# Patient Record
Sex: Female | Born: 1945
Health system: Southern US, Community
[De-identification: ages and names within clinical notes are randomized; demographics above are authoritative.]

## PROBLEM LIST (undated history)

## (undated) ENCOUNTER — Emergency Department (HOSPITAL_COMMUNITY): Admission: EM | Payer: Medicare Other | Source: Home / Self Care

## (undated) DIAGNOSIS — M4310 Spondylolisthesis, site unspecified: Secondary | ICD-10-CM

## (undated) DIAGNOSIS — F32A Depression, unspecified: Secondary | ICD-10-CM

## (undated) DIAGNOSIS — J189 Pneumonia, unspecified organism: Secondary | ICD-10-CM

## (undated) DIAGNOSIS — H532 Diplopia: Secondary | ICD-10-CM

## (undated) DIAGNOSIS — C4490 Unspecified malignant neoplasm of skin, unspecified: Secondary | ICD-10-CM

## (undated) DIAGNOSIS — I639 Cerebral infarction, unspecified: Secondary | ICD-10-CM

## (undated) DIAGNOSIS — R0602 Shortness of breath: Secondary | ICD-10-CM

## (undated) DIAGNOSIS — I272 Pulmonary hypertension, unspecified: Secondary | ICD-10-CM

## (undated) DIAGNOSIS — Z950 Presence of cardiac pacemaker: Secondary | ICD-10-CM

## (undated) DIAGNOSIS — Z7901 Long term (current) use of anticoagulants: Secondary | ICD-10-CM

## (undated) DIAGNOSIS — I2119 ST elevation (STEMI) myocardial infarction involving other coronary artery of inferior wall: Secondary | ICD-10-CM

## (undated) DIAGNOSIS — M1990 Unspecified osteoarthritis, unspecified site: Secondary | ICD-10-CM

## (undated) DIAGNOSIS — E669 Obesity, unspecified: Secondary | ICD-10-CM

## (undated) DIAGNOSIS — K649 Unspecified hemorrhoids: Secondary | ICD-10-CM

## (undated) DIAGNOSIS — F419 Anxiety disorder, unspecified: Secondary | ICD-10-CM

## (undated) DIAGNOSIS — N189 Chronic kidney disease, unspecified: Secondary | ICD-10-CM

## (undated) DIAGNOSIS — G4733 Obstructive sleep apnea (adult) (pediatric): Secondary | ICD-10-CM

## (undated) DIAGNOSIS — I10 Essential (primary) hypertension: Secondary | ICD-10-CM

## (undated) DIAGNOSIS — G4730 Sleep apnea, unspecified: Secondary | ICD-10-CM

## (undated) DIAGNOSIS — Z9989 Dependence on other enabling machines and devices: Secondary | ICD-10-CM

## (undated) DIAGNOSIS — G459 Transient cerebral ischemic attack, unspecified: Secondary | ICD-10-CM

## (undated) DIAGNOSIS — I482 Chronic atrial fibrillation, unspecified: Secondary | ICD-10-CM

## (undated) DIAGNOSIS — I219 Acute myocardial infarction, unspecified: Secondary | ICD-10-CM

## (undated) DIAGNOSIS — F329 Major depressive disorder, single episode, unspecified: Secondary | ICD-10-CM

## (undated) DIAGNOSIS — R600 Localized edema: Secondary | ICD-10-CM

## (undated) DIAGNOSIS — E785 Hyperlipidemia, unspecified: Secondary | ICD-10-CM

## (undated) DIAGNOSIS — I499 Cardiac arrhythmia, unspecified: Secondary | ICD-10-CM

## (undated) DIAGNOSIS — I509 Heart failure, unspecified: Secondary | ICD-10-CM

## (undated) DIAGNOSIS — I2510 Atherosclerotic heart disease of native coronary artery without angina pectoris: Secondary | ICD-10-CM

## (undated) HISTORY — PX: JOINT REPLACEMENT: SHX530

## (undated) HISTORY — DX: Obesity, unspecified: E66.9

## (undated) HISTORY — DX: Chronic atrial fibrillation, unspecified: I48.20

## (undated) HISTORY — DX: Diplopia: H53.2

## (undated) HISTORY — DX: Unspecified hemorrhoids: K64.9

## (undated) HISTORY — DX: Sleep apnea, unspecified: G47.30

## (undated) HISTORY — DX: Depression, unspecified: F32.A

## (undated) HISTORY — DX: Atherosclerotic heart disease of native coronary artery without angina pectoris: I25.10

## (undated) HISTORY — PX: CATARACT EXTRACTION W/ INTRAOCULAR LENS  IMPLANT, BILATERAL: SHX1307

## (undated) HISTORY — PX: SKIN CANCER EXCISION: SHX779

## (undated) HISTORY — DX: Dependence on other enabling machines and devices: Z99.89

## (undated) HISTORY — DX: Localized edema: R60.0

## (undated) HISTORY — PX: LEFT OOPHORECTOMY: SHX1961

## (undated) HISTORY — PX: BACK SURGERY: SHX140

## (undated) HISTORY — DX: Long term (current) use of anticoagulants: Z79.01

## (undated) HISTORY — DX: Anxiety disorder, unspecified: F41.9

## (undated) HISTORY — PX: POSTERIOR LUMBAR FUSION: SHX6036

## (undated) HISTORY — PX: CHOLECYSTECTOMY: SHX55

## (undated) HISTORY — DX: Obstructive sleep apnea (adult) (pediatric): G47.33

## (undated) HISTORY — DX: Hyperlipidemia, unspecified: E78.5

## (undated) HISTORY — DX: Essential (primary) hypertension: I10

## (undated) HISTORY — PX: CORONARY ANGIOPLASTY: SHX604

## (undated) HISTORY — PX: CORONARY ANGIOPLASTY WITH STENT PLACEMENT: SHX49

## (undated) HISTORY — DX: Major depressive disorder, single episode, unspecified: F32.9

## (undated) HISTORY — PX: ABDOMINAL HYSTERECTOMY: SHX81

## (undated) HISTORY — DX: Presence of cardiac pacemaker: Z95.0

---

## 1979-07-04 DIAGNOSIS — G4730 Sleep apnea, unspecified: Secondary | ICD-10-CM

## 1979-07-04 HISTORY — DX: Sleep apnea, unspecified: G47.30

## 1982-07-03 HISTORY — PX: TOTAL ABDOMINAL HYSTERECTOMY: SHX209

## 1982-07-03 HISTORY — PX: APPENDECTOMY: SHX54

## 1992-07-03 HISTORY — PX: LAPAROSCOPIC CHOLECYSTECTOMY: SUR755

## 1997-10-01 ENCOUNTER — Encounter: Admission: RE | Admit: 1997-10-01 | Discharge: 1997-12-30 | Payer: Self-pay | Admitting: Family Medicine

## 1997-11-19 ENCOUNTER — Other Ambulatory Visit: Admission: RE | Admit: 1997-11-19 | Discharge: 1997-11-19 | Payer: Self-pay | Admitting: Cardiovascular Disease

## 1999-04-27 ENCOUNTER — Encounter: Payer: Self-pay | Admitting: Neurosurgery

## 1999-04-29 ENCOUNTER — Inpatient Hospital Stay (HOSPITAL_COMMUNITY): Admission: RE | Admit: 1999-04-29 | Discharge: 1999-05-01 | Payer: Self-pay | Admitting: Neurosurgery

## 1999-04-29 ENCOUNTER — Encounter: Payer: Self-pay | Admitting: Neurosurgery

## 1999-05-30 ENCOUNTER — Encounter: Payer: Self-pay | Admitting: Neurosurgery

## 1999-05-30 ENCOUNTER — Ambulatory Visit (HOSPITAL_COMMUNITY): Admission: RE | Admit: 1999-05-30 | Discharge: 1999-05-30 | Payer: Self-pay | Admitting: Neurosurgery

## 1999-08-08 ENCOUNTER — Ambulatory Visit (HOSPITAL_COMMUNITY): Admission: RE | Admit: 1999-08-08 | Discharge: 1999-08-08 | Payer: Self-pay | Admitting: Neurosurgery

## 1999-08-08 ENCOUNTER — Encounter: Payer: Self-pay | Admitting: Neurosurgery

## 2000-05-07 ENCOUNTER — Encounter: Payer: Self-pay | Admitting: Family Medicine

## 2000-05-07 ENCOUNTER — Encounter: Admission: RE | Admit: 2000-05-07 | Discharge: 2000-05-07 | Payer: Self-pay | Admitting: Family Medicine

## 2002-04-18 ENCOUNTER — Ambulatory Visit (HOSPITAL_COMMUNITY): Admission: RE | Admit: 2002-04-18 | Discharge: 2002-04-18 | Payer: Self-pay | Admitting: Family Medicine

## 2002-04-18 ENCOUNTER — Encounter: Payer: Self-pay | Admitting: Family Medicine

## 2003-06-12 ENCOUNTER — Ambulatory Visit (HOSPITAL_COMMUNITY): Admission: RE | Admit: 2003-06-12 | Discharge: 2003-06-12 | Payer: Self-pay | Admitting: Cardiovascular Disease

## 2003-07-15 DIAGNOSIS — K649 Unspecified hemorrhoids: Secondary | ICD-10-CM

## 2003-07-15 HISTORY — DX: Unspecified hemorrhoids: K64.9

## 2003-11-06 ENCOUNTER — Ambulatory Visit (HOSPITAL_COMMUNITY): Admission: RE | Admit: 2003-11-06 | Discharge: 2003-11-06 | Payer: Self-pay | Admitting: Neurosurgery

## 2003-11-12 ENCOUNTER — Encounter: Admission: RE | Admit: 2003-11-12 | Discharge: 2003-11-12 | Payer: Self-pay | Admitting: Neurosurgery

## 2003-11-16 ENCOUNTER — Inpatient Hospital Stay (HOSPITAL_COMMUNITY): Admission: RE | Admit: 2003-11-16 | Discharge: 2003-11-18 | Payer: Self-pay | Admitting: Neurosurgery

## 2003-12-17 ENCOUNTER — Encounter: Admission: RE | Admit: 2003-12-17 | Discharge: 2003-12-17 | Payer: Self-pay | Admitting: Neurosurgery

## 2004-02-24 ENCOUNTER — Encounter: Admission: RE | Admit: 2004-02-24 | Discharge: 2004-02-24 | Payer: Self-pay | Admitting: Neurosurgery

## 2005-03-01 ENCOUNTER — Ambulatory Visit: Payer: Self-pay | Admitting: Internal Medicine

## 2005-03-07 ENCOUNTER — Ambulatory Visit: Payer: Self-pay

## 2005-04-14 ENCOUNTER — Ambulatory Visit: Payer: Self-pay | Admitting: Internal Medicine

## 2005-07-03 LAB — HM COLONOSCOPY

## 2005-12-02 ENCOUNTER — Inpatient Hospital Stay (HOSPITAL_COMMUNITY): Admission: EM | Admit: 2005-12-02 | Discharge: 2005-12-04 | Payer: Self-pay | Admitting: Emergency Medicine

## 2006-05-05 ENCOUNTER — Inpatient Hospital Stay (HOSPITAL_COMMUNITY): Admission: EM | Admit: 2006-05-05 | Discharge: 2006-05-11 | Payer: Self-pay | Admitting: Emergency Medicine

## 2006-07-03 DIAGNOSIS — G459 Transient cerebral ischemic attack, unspecified: Secondary | ICD-10-CM

## 2006-07-03 HISTORY — DX: Transient cerebral ischemic attack, unspecified: G45.9

## 2006-10-19 ENCOUNTER — Ambulatory Visit: Payer: Self-pay | Admitting: Internal Medicine

## 2007-03-20 ENCOUNTER — Ambulatory Visit: Payer: Self-pay | Admitting: Internal Medicine

## 2007-03-25 ENCOUNTER — Ambulatory Visit: Payer: Self-pay | Admitting: Internal Medicine

## 2007-03-25 ENCOUNTER — Inpatient Hospital Stay (HOSPITAL_COMMUNITY): Admission: AD | Admit: 2007-03-25 | Discharge: 2007-03-29 | Payer: Self-pay | Admitting: Internal Medicine

## 2007-04-03 ENCOUNTER — Ambulatory Visit: Payer: Self-pay | Admitting: Cardiology

## 2007-04-03 LAB — CONVERTED CEMR LAB
BUN: 14 mg/dL (ref 6–23)
CO2: 34 meq/L — ABNORMAL HIGH (ref 19–32)
Calcium: 9.2 mg/dL (ref 8.4–10.5)
Chloride: 99 meq/L (ref 96–112)
Creatinine, Ser: 0.9 mg/dL (ref 0.4–1.2)
GFR calc Af Amer: 82 mL/min
GFR calc non Af Amer: 68 mL/min
Glucose, Bld: 89 mg/dL (ref 70–99)
Potassium: 3.7 meq/L (ref 3.5–5.1)
Sodium: 138 meq/L (ref 135–145)

## 2007-04-16 ENCOUNTER — Ambulatory Visit: Payer: Self-pay | Admitting: Cardiovascular Disease

## 2007-04-25 ENCOUNTER — Ambulatory Visit: Payer: Self-pay | Admitting: Cardiovascular Disease

## 2007-05-09 ENCOUNTER — Ambulatory Visit: Payer: Self-pay

## 2007-05-09 ENCOUNTER — Ambulatory Visit: Payer: Self-pay | Admitting: Cardiology

## 2007-05-09 ENCOUNTER — Ambulatory Visit: Payer: Self-pay | Admitting: Internal Medicine

## 2007-05-28 ENCOUNTER — Ambulatory Visit: Payer: Self-pay | Admitting: Internal Medicine

## 2007-06-03 DIAGNOSIS — I482 Chronic atrial fibrillation, unspecified: Secondary | ICD-10-CM

## 2007-06-03 HISTORY — PX: INSERT / REPLACE / REMOVE PACEMAKER: SUR710

## 2007-06-03 HISTORY — DX: Chronic atrial fibrillation, unspecified: I48.20

## 2007-06-11 ENCOUNTER — Ambulatory Visit: Payer: Self-pay | Admitting: Internal Medicine

## 2007-06-11 LAB — CONVERTED CEMR LAB
BUN: 13 mg/dL (ref 6–23)
Basophils Absolute: 0.1 10*3/uL (ref 0.0–0.1)
Basophils Relative: 0.7 % (ref 0.0–1.0)
CO2: 32 meq/L (ref 19–32)
Calcium: 9.3 mg/dL (ref 8.4–10.5)
Chloride: 95 meq/L — ABNORMAL LOW (ref 96–112)
Creatinine, Ser: 0.9 mg/dL (ref 0.4–1.2)
Eosinophils Absolute: 0.2 10*3/uL (ref 0.0–0.6)
Eosinophils Relative: 2.4 % (ref 0.0–5.0)
GFR calc Af Amer: 82 mL/min
GFR calc non Af Amer: 68 mL/min
Glucose, Bld: 93 mg/dL (ref 70–99)
HCT: 39.2 % (ref 36.0–46.0)
Hemoglobin: 13.5 g/dL (ref 12.0–15.0)
INR: 2 — ABNORMAL HIGH (ref 0.8–1.0)
Lymphocytes Relative: 35.1 % (ref 12.0–46.0)
MCHC: 34.5 g/dL (ref 30.0–36.0)
MCV: 93.7 fL (ref 78.0–100.0)
Monocytes Absolute: 0.9 10*3/uL — ABNORMAL HIGH (ref 0.2–0.7)
Monocytes Relative: 9.3 % (ref 3.0–11.0)
Neutro Abs: 5 10*3/uL (ref 1.4–7.7)
Neutrophils Relative %: 52.5 % (ref 43.0–77.0)
Platelets: 291 10*3/uL (ref 150–400)
Potassium: 3.9 meq/L (ref 3.5–5.1)
Prothrombin Time: 17.8 s — ABNORMAL HIGH (ref 10.9–13.3)
RBC: 4.18 M/uL (ref 3.87–5.11)
RDW: 12.8 % (ref 11.5–14.6)
Sodium: 135 meq/L (ref 135–145)
WBC: 9.5 10*3/uL (ref 4.5–10.5)
aPTT: 32.7 s — ABNORMAL HIGH (ref 21.7–29.8)

## 2007-06-12 ENCOUNTER — Ambulatory Visit: Payer: Self-pay | Admitting: Cardiology

## 2007-06-13 ENCOUNTER — Ambulatory Visit: Payer: Self-pay | Admitting: Internal Medicine

## 2007-06-13 ENCOUNTER — Ambulatory Visit (HOSPITAL_COMMUNITY): Admission: RE | Admit: 2007-06-13 | Discharge: 2007-06-14 | Payer: Self-pay | Admitting: Internal Medicine

## 2007-06-20 ENCOUNTER — Ambulatory Visit: Payer: Self-pay | Admitting: Cardiology

## 2007-06-24 ENCOUNTER — Ambulatory Visit: Payer: Self-pay | Admitting: Cardiology

## 2007-07-01 ENCOUNTER — Ambulatory Visit: Payer: Self-pay | Admitting: Cardiology

## 2007-07-01 ENCOUNTER — Ambulatory Visit: Payer: Self-pay

## 2007-07-04 HISTORY — PX: DOPPLER ECHOCARDIOGRAPHY: SHX263

## 2007-07-09 ENCOUNTER — Ambulatory Visit: Payer: Self-pay | Admitting: Cardiology

## 2007-07-09 ENCOUNTER — Ambulatory Visit: Payer: Self-pay | Admitting: Internal Medicine

## 2007-07-19 ENCOUNTER — Ambulatory Visit: Payer: Self-pay | Admitting: Cardiology

## 2007-08-02 ENCOUNTER — Ambulatory Visit: Payer: Self-pay | Admitting: Cardiology

## 2007-08-28 ENCOUNTER — Ambulatory Visit: Payer: Self-pay | Admitting: Cardiology

## 2007-09-06 ENCOUNTER — Ambulatory Visit: Payer: Self-pay | Admitting: Cardiology

## 2007-09-16 ENCOUNTER — Ambulatory Visit: Payer: Self-pay | Admitting: Internal Medicine

## 2007-10-14 ENCOUNTER — Ambulatory Visit: Payer: Self-pay | Admitting: Cardiology

## 2007-11-11 ENCOUNTER — Ambulatory Visit: Payer: Self-pay | Admitting: Cardiology

## 2007-12-13 ENCOUNTER — Ambulatory Visit: Payer: Self-pay | Admitting: Cardiology

## 2008-01-06 ENCOUNTER — Ambulatory Visit: Payer: Self-pay | Admitting: Internal Medicine

## 2008-01-06 ENCOUNTER — Ambulatory Visit: Payer: Self-pay | Admitting: Cardiology

## 2008-02-10 ENCOUNTER — Ambulatory Visit: Payer: Self-pay | Admitting: Cardiology

## 2008-04-08 ENCOUNTER — Ambulatory Visit: Payer: Self-pay | Admitting: Cardiology

## 2008-05-15 ENCOUNTER — Ambulatory Visit: Payer: Self-pay | Admitting: Internal Medicine

## 2008-05-15 ENCOUNTER — Ambulatory Visit: Payer: Self-pay | Admitting: Cardiovascular Disease

## 2008-06-05 ENCOUNTER — Ambulatory Visit: Payer: Self-pay | Admitting: Internal Medicine

## 2008-06-08 ENCOUNTER — Inpatient Hospital Stay (HOSPITAL_COMMUNITY): Admission: EM | Admit: 2008-06-08 | Discharge: 2008-06-10 | Payer: Self-pay | Admitting: Emergency Medicine

## 2008-06-08 ENCOUNTER — Ambulatory Visit: Payer: Self-pay | Admitting: Internal Medicine

## 2008-06-09 ENCOUNTER — Encounter (INDEPENDENT_AMBULATORY_CARE_PROVIDER_SITE_OTHER): Payer: Self-pay | Admitting: Internal Medicine

## 2008-06-10 ENCOUNTER — Encounter: Payer: Self-pay | Admitting: Internal Medicine

## 2008-06-15 ENCOUNTER — Encounter: Payer: Self-pay | Admitting: Internal Medicine

## 2008-06-19 ENCOUNTER — Ambulatory Visit: Payer: Self-pay | Admitting: Internal Medicine

## 2008-06-19 DIAGNOSIS — R7309 Other abnormal glucose: Secondary | ICD-10-CM | POA: Insufficient documentation

## 2008-06-19 DIAGNOSIS — Z8679 Personal history of other diseases of the circulatory system: Secondary | ICD-10-CM | POA: Insufficient documentation

## 2008-06-19 DIAGNOSIS — I495 Sick sinus syndrome: Secondary | ICD-10-CM | POA: Insufficient documentation

## 2008-06-19 DIAGNOSIS — Z9861 Coronary angioplasty status: Secondary | ICD-10-CM

## 2008-06-19 DIAGNOSIS — Z950 Presence of cardiac pacemaker: Secondary | ICD-10-CM | POA: Insufficient documentation

## 2008-06-19 DIAGNOSIS — H532 Diplopia: Secondary | ICD-10-CM

## 2008-06-19 DIAGNOSIS — I10 Essential (primary) hypertension: Secondary | ICD-10-CM | POA: Insufficient documentation

## 2008-06-19 DIAGNOSIS — I480 Paroxysmal atrial fibrillation: Secondary | ICD-10-CM | POA: Insufficient documentation

## 2008-06-19 DIAGNOSIS — I671 Cerebral aneurysm, nonruptured: Secondary | ICD-10-CM | POA: Insufficient documentation

## 2008-06-19 DIAGNOSIS — Z8659 Personal history of other mental and behavioral disorders: Secondary | ICD-10-CM | POA: Insufficient documentation

## 2008-06-19 DIAGNOSIS — I2510 Atherosclerotic heart disease of native coronary artery without angina pectoris: Secondary | ICD-10-CM | POA: Insufficient documentation

## 2008-06-19 DIAGNOSIS — J309 Allergic rhinitis, unspecified: Secondary | ICD-10-CM | POA: Insufficient documentation

## 2008-06-19 HISTORY — DX: Diplopia: H53.2

## 2008-06-22 ENCOUNTER — Ambulatory Visit: Payer: Self-pay | Admitting: Internal Medicine

## 2008-06-22 ENCOUNTER — Ambulatory Visit: Payer: Self-pay | Admitting: Cardiology

## 2008-06-22 LAB — CONVERTED CEMR LAB
Homocysteine: 10.2 micromoles/L (ref 4.0–15.4)
Vit D, 1,25-Dihydroxy: 43 (ref 30–89)

## 2008-06-24 ENCOUNTER — Encounter: Payer: Self-pay | Admitting: Internal Medicine

## 2008-06-24 LAB — CONVERTED CEMR LAB
BUN: 22 mg/dL (ref 6–23)
CO2: 31 meq/L (ref 19–32)
CRP, High Sensitivity: 10 — ABNORMAL HIGH (ref 0.00–5.00)
Calcium: 9.6 mg/dL (ref 8.4–10.5)
Chloride: 100 meq/L (ref 96–112)
Cholesterol: 142 mg/dL (ref 0–200)
Creatinine, Ser: 0.9 mg/dL (ref 0.4–1.2)
GFR calc Af Amer: 82 mL/min
GFR calc non Af Amer: 67 mL/min
Glucose, Bld: 118 mg/dL — ABNORMAL HIGH (ref 70–99)
HDL: 59.5 mg/dL (ref >39.0)
LDL Cholesterol: 62 mg/dL (ref 0–99)
Potassium: 4 meq/L (ref 3.5–5.1)
Sodium: 138 meq/L (ref 135–145)
Total CHOL/HDL Ratio: 2.4
Triglycerides: 105 mg/dL (ref 0–149)
VLDL: 21 mg/dL (ref 0–40)

## 2008-07-02 ENCOUNTER — Ambulatory Visit: Payer: Self-pay | Admitting: Internal Medicine

## 2008-07-07 ENCOUNTER — Ambulatory Visit: Payer: Self-pay | Admitting: Internal Medicine

## 2008-07-28 ENCOUNTER — Ambulatory Visit: Payer: Self-pay | Admitting: Cardiology

## 2008-08-03 ENCOUNTER — Encounter: Payer: Self-pay | Admitting: Internal Medicine

## 2008-08-14 ENCOUNTER — Encounter: Payer: Self-pay | Admitting: Internal Medicine

## 2008-08-25 ENCOUNTER — Ambulatory Visit: Payer: Self-pay | Admitting: Cardiovascular Disease

## 2008-09-08 ENCOUNTER — Ambulatory Visit: Payer: Self-pay | Admitting: Cardiology

## 2008-09-15 ENCOUNTER — Ambulatory Visit: Payer: Self-pay | Admitting: Internal Medicine

## 2008-09-15 DIAGNOSIS — R0989 Other specified symptoms and signs involving the circulatory and respiratory systems: Secondary | ICD-10-CM | POA: Insufficient documentation

## 2008-09-15 DIAGNOSIS — R0609 Other forms of dyspnea: Secondary | ICD-10-CM | POA: Insufficient documentation

## 2008-09-15 DIAGNOSIS — E785 Hyperlipidemia, unspecified: Secondary | ICD-10-CM | POA: Insufficient documentation

## 2008-09-15 LAB — CONVERTED CEMR LAB: Blood Glucose, Fingerstick: 104

## 2008-09-29 ENCOUNTER — Ambulatory Visit: Payer: Self-pay | Admitting: Cardiology

## 2008-10-12 ENCOUNTER — Ambulatory Visit: Payer: Self-pay | Admitting: Pulmonary Disease

## 2008-10-12 DIAGNOSIS — G4733 Obstructive sleep apnea (adult) (pediatric): Secondary | ICD-10-CM | POA: Insufficient documentation

## 2008-10-16 ENCOUNTER — Encounter (INDEPENDENT_AMBULATORY_CARE_PROVIDER_SITE_OTHER): Payer: Self-pay

## 2008-10-20 ENCOUNTER — Encounter: Payer: Self-pay | Admitting: Internal Medicine

## 2008-10-20 ENCOUNTER — Ambulatory Visit: Payer: Self-pay | Admitting: Cardiology

## 2008-10-20 ENCOUNTER — Ambulatory Visit (HOSPITAL_BASED_OUTPATIENT_CLINIC_OR_DEPARTMENT_OTHER): Admission: RE | Admit: 2008-10-20 | Discharge: 2008-10-20 | Payer: Self-pay | Admitting: Pulmonary Disease

## 2008-10-20 ENCOUNTER — Encounter: Payer: Self-pay | Admitting: Pulmonary Disease

## 2008-10-24 ENCOUNTER — Ambulatory Visit: Payer: Self-pay | Admitting: Pulmonary Disease

## 2008-10-27 ENCOUNTER — Telehealth (INDEPENDENT_AMBULATORY_CARE_PROVIDER_SITE_OTHER): Payer: Self-pay | Admitting: *Deleted

## 2008-10-29 ENCOUNTER — Ambulatory Visit: Payer: Self-pay | Admitting: Pulmonary Disease

## 2008-11-02 ENCOUNTER — Ambulatory Visit: Payer: Self-pay | Admitting: Internal Medicine

## 2008-11-17 ENCOUNTER — Encounter: Payer: Self-pay | Admitting: Internal Medicine

## 2008-11-24 ENCOUNTER — Encounter (INDEPENDENT_AMBULATORY_CARE_PROVIDER_SITE_OTHER): Payer: Self-pay | Admitting: *Deleted

## 2008-12-01 ENCOUNTER — Encounter: Payer: Self-pay | Admitting: *Deleted

## 2008-12-01 ENCOUNTER — Ambulatory Visit: Payer: Self-pay | Admitting: Pulmonary Disease

## 2009-01-06 ENCOUNTER — Encounter: Payer: Self-pay | Admitting: *Deleted

## 2009-01-22 ENCOUNTER — Ambulatory Visit: Payer: Self-pay | Admitting: Internal Medicine

## 2009-02-07 ENCOUNTER — Encounter: Payer: Self-pay | Admitting: Internal Medicine

## 2009-02-09 ENCOUNTER — Encounter: Payer: Self-pay | Admitting: Cardiology

## 2009-02-17 ENCOUNTER — Encounter (INDEPENDENT_AMBULATORY_CARE_PROVIDER_SITE_OTHER): Payer: Self-pay | Admitting: *Deleted

## 2009-02-18 ENCOUNTER — Encounter: Payer: Self-pay | Admitting: Internal Medicine

## 2009-03-10 ENCOUNTER — Encounter: Payer: Self-pay | Admitting: Cardiology

## 2009-04-06 ENCOUNTER — Encounter: Payer: Self-pay | Admitting: Pulmonary Disease

## 2009-04-12 ENCOUNTER — Encounter: Payer: Self-pay | Admitting: Internal Medicine

## 2009-04-12 LAB — HM MAMMOGRAPHY: HM Mammogram: NORMAL

## 2009-05-31 ENCOUNTER — Ambulatory Visit: Payer: Self-pay | Admitting: Pulmonary Disease

## 2009-05-31 ENCOUNTER — Ambulatory Visit: Payer: Self-pay | Admitting: Internal Medicine

## 2009-05-31 LAB — CONVERTED CEMR LAB
ALT: 33 units/L (ref 0–35)
AST: 22 units/L (ref 0–37)
Albumin: 3.9 g/dL (ref 3.5–5.2)
Alkaline Phosphatase: 79 units/L (ref 39–117)
BUN: 16 mg/dL (ref 6–23)
Basophils Absolute: 0.1 10*3/uL (ref 0.0–0.1)
Basophils Relative: 1.4 % (ref 0.0–3.0)
Bilirubin Urine: NEGATIVE
Bilirubin, Direct: 0 mg/dL (ref 0.0–0.3)
CO2: 29 meq/L (ref 19–32)
Calcium: 9.1 mg/dL (ref 8.4–10.5)
Chloride: 99 meq/L (ref 96–112)
Cholesterol: 138 mg/dL (ref 0–200)
Creatinine, Ser: 0.8 mg/dL (ref 0.4–1.2)
Eosinophils Absolute: 0.2 10*3/uL (ref 0.0–0.7)
Eosinophils Relative: 2.5 % (ref 0.0–5.0)
GFR calc non Af Amer: 76.92 mL/min (ref >60)
Glucose, Bld: 106 mg/dL — ABNORMAL HIGH (ref 70–99)
Glucose, Urine, Semiquant: NEGATIVE
HCT: 41.6 % (ref 36.0–46.0)
HDL: 51.9 mg/dL (ref >39.00)
Hemoglobin: 13.9 g/dL (ref 12.0–15.0)
Hgb A1c MFr Bld: 6.4 % (ref 4.6–6.5)
Ketones, urine, test strip: NEGATIVE
LDL Cholesterol: 60 mg/dL (ref 0–99)
Lymphocytes Relative: 29.9 % (ref 12.0–46.0)
Lymphs Abs: 2.1 10*3/uL (ref 0.7–4.0)
MCHC: 33.3 g/dL (ref 30.0–36.0)
MCV: 95.7 fL (ref 78.0–100.0)
Monocytes Absolute: 0.6 10*3/uL (ref 0.1–1.0)
Monocytes Relative: 7.8 % (ref 3.0–12.0)
Neutro Abs: 4.1 10*3/uL (ref 1.4–7.7)
Neutrophils Relative %: 58.4 % (ref 43.0–77.0)
Nitrite: NEGATIVE
Platelets: 263 10*3/uL (ref 150.0–400.0)
Potassium: 3.9 meq/L (ref 3.5–5.1)
Protein, U semiquant: NEGATIVE
RBC: 4.34 M/uL (ref 3.87–5.11)
RDW: 12.9 % (ref 11.5–14.6)
Sodium: 137 meq/L (ref 135–145)
Specific Gravity, Urine: 1.015
TSH: 2.19 microintl units/mL (ref 0.35–5.50)
Total Bilirubin: 0.9 mg/dL (ref 0.3–1.2)
Total CHOL/HDL Ratio: 3
Total Protein: 7.5 g/dL (ref 6.0–8.3)
Triglycerides: 130 mg/dL (ref 0.0–149.0)
Urobilinogen, UA: 0.2
VLDL: 26 mg/dL (ref 0.0–40.0)
WBC Urine, dipstick: NEGATIVE
WBC: 7.1 10*3/uL (ref 4.5–10.5)
pH: 7.5

## 2009-06-02 ENCOUNTER — Encounter: Payer: Self-pay | Admitting: Internal Medicine

## 2009-06-02 ENCOUNTER — Ambulatory Visit: Payer: Self-pay | Admitting: Internal Medicine

## 2009-06-02 DIAGNOSIS — E669 Obesity, unspecified: Secondary | ICD-10-CM | POA: Insufficient documentation

## 2009-06-02 DIAGNOSIS — E66812 Obesity, class 2: Secondary | ICD-10-CM | POA: Insufficient documentation

## 2009-06-02 LAB — CONVERTED CEMR LAB: Blood Glucose, Fingerstick: 114

## 2009-07-03 LAB — HM DIABETES EYE EXAM

## 2009-07-12 ENCOUNTER — Ambulatory Visit: Payer: Self-pay

## 2009-07-12 ENCOUNTER — Encounter: Payer: Self-pay | Admitting: Internal Medicine

## 2009-10-04 ENCOUNTER — Encounter: Payer: Self-pay | Admitting: Pulmonary Disease

## 2009-10-11 ENCOUNTER — Ambulatory Visit: Payer: Self-pay | Admitting: Internal Medicine

## 2009-11-30 ENCOUNTER — Telehealth: Payer: Self-pay | Admitting: Internal Medicine

## 2009-12-30 ENCOUNTER — Ambulatory Visit: Payer: Self-pay | Admitting: Internal Medicine

## 2009-12-30 DIAGNOSIS — L538 Other specified erythematous conditions: Secondary | ICD-10-CM | POA: Insufficient documentation

## 2009-12-30 DIAGNOSIS — B009 Herpesviral infection, unspecified: Secondary | ICD-10-CM | POA: Insufficient documentation

## 2010-01-10 ENCOUNTER — Ambulatory Visit: Payer: Self-pay | Admitting: Internal Medicine

## 2010-02-18 ENCOUNTER — Ambulatory Visit: Payer: Self-pay | Admitting: Cardiovascular Disease

## 2010-02-24 ENCOUNTER — Ambulatory Visit: Payer: Self-pay | Admitting: Internal Medicine

## 2010-02-24 LAB — CONVERTED CEMR LAB
ALT: 28 units/L (ref 0–35)
AST: 26 units/L (ref 0–37)
Albumin: 4 g/dL (ref 3.5–5.2)
Alkaline Phosphatase: 76 units/L (ref 39–117)
BUN: 21 mg/dL (ref 6–23)
Bilirubin, Direct: 0.1 mg/dL (ref 0.0–0.3)
CO2: 29 meq/L (ref 19–32)
Calcium: 9.4 mg/dL (ref 8.4–10.5)
Chloride: 99 meq/L (ref 96–112)
Cholesterol: 144 mg/dL (ref 0–200)
Creatinine, Ser: 0.9 mg/dL (ref 0.4–1.2)
GFR calc non Af Amer: 67.86 mL/min (ref >60)
Glucose, Bld: 107 mg/dL — ABNORMAL HIGH (ref 70–99)
HDL: 61.1 mg/dL (ref >39.00)
Hgb A1c MFr Bld: 6.5 % (ref 4.6–6.5)
LDL Cholesterol: 64 mg/dL (ref 0–99)
Potassium: 4.5 meq/L (ref 3.5–5.1)
Sodium: 135 meq/L (ref 135–145)
Total Bilirubin: 0.8 mg/dL (ref 0.3–1.2)
Total CHOL/HDL Ratio: 2
Total Protein: 6.8 g/dL (ref 6.0–8.3)
Triglycerides: 93 mg/dL (ref 0.0–149.0)
VLDL: 18.6 mg/dL (ref 0.0–40.0)

## 2010-03-01 ENCOUNTER — Ambulatory Visit: Payer: Self-pay | Admitting: Internal Medicine

## 2010-03-02 ENCOUNTER — Telehealth: Payer: Self-pay | Admitting: *Deleted

## 2010-03-11 ENCOUNTER — Telehealth: Payer: Self-pay | Admitting: Internal Medicine

## 2010-03-16 ENCOUNTER — Encounter: Payer: Self-pay | Admitting: Internal Medicine

## 2010-03-16 ENCOUNTER — Ambulatory Visit: Payer: Self-pay | Admitting: Cardiovascular Disease

## 2010-03-21 ENCOUNTER — Encounter (INDEPENDENT_AMBULATORY_CARE_PROVIDER_SITE_OTHER): Payer: Self-pay | Admitting: *Deleted

## 2010-04-11 ENCOUNTER — Ambulatory Visit: Payer: Self-pay | Admitting: Internal Medicine

## 2010-04-21 ENCOUNTER — Encounter: Payer: Self-pay | Admitting: Internal Medicine

## 2010-04-21 ENCOUNTER — Encounter: Admission: RE | Admit: 2010-04-21 | Discharge: 2010-04-21 | Payer: Self-pay | Admitting: Neurosurgery

## 2010-06-01 ENCOUNTER — Ambulatory Visit: Payer: Self-pay | Admitting: Internal Medicine

## 2010-06-01 LAB — CONVERTED CEMR LAB
ALT: 40 units/L — ABNORMAL HIGH (ref 0–35)
AST: 31 units/L (ref 0–37)
Albumin: 4 g/dL (ref 3.5–5.2)
Alkaline Phosphatase: 84 units/L (ref 39–117)
BUN: 18 mg/dL (ref 6–23)
Basophils Absolute: 0.1 10*3/uL (ref 0.0–0.1)
Basophils Relative: 0.9 % (ref 0.0–3.0)
Bilirubin Urine: NEGATIVE
Bilirubin, Direct: 0.1 mg/dL (ref 0.0–0.3)
Blood in Urine, dipstick: NEGATIVE
CO2: 29 meq/L (ref 19–32)
Calcium: 9.5 mg/dL (ref 8.4–10.5)
Chloride: 99 meq/L (ref 96–112)
Cholesterol: 135 mg/dL (ref 0–200)
Creatinine, Ser: 0.9 mg/dL (ref 0.4–1.2)
Eosinophils Absolute: 0.3 10*3/uL (ref 0.0–0.7)
Eosinophils Relative: 4 % (ref 0.0–5.0)
GFR calc non Af Amer: 63.66 mL/min (ref >60)
Glucose, Bld: 115 mg/dL — ABNORMAL HIGH (ref 70–99)
Glucose, Urine, Semiquant: NEGATIVE
HCT: 40 % (ref 36.0–46.0)
HDL: 51.6 mg/dL (ref >39.00)
Hemoglobin: 13.6 g/dL (ref 12.0–15.0)
Hgb A1c MFr Bld: 6.3 % (ref 4.6–6.5)
Ketones, urine, test strip: NEGATIVE
LDL Cholesterol: 63 mg/dL (ref 0–99)
Lymphocytes Relative: 31.5 % (ref 12.0–46.0)
Lymphs Abs: 2.2 10*3/uL (ref 0.7–4.0)
MCHC: 34.1 g/dL (ref 30.0–36.0)
MCV: 96.6 fL (ref 78.0–100.0)
Monocytes Absolute: 0.6 10*3/uL (ref 0.1–1.0)
Monocytes Relative: 8.6 % (ref 3.0–12.0)
Neutro Abs: 3.8 10*3/uL (ref 1.4–7.7)
Neutrophils Relative %: 55 % (ref 43.0–77.0)
Nitrite: NEGATIVE
Platelets: 275 10*3/uL (ref 150.0–400.0)
Potassium: 4.7 meq/L (ref 3.5–5.1)
Protein, U semiquant: NEGATIVE
RBC: 4.14 M/uL (ref 3.87–5.11)
RDW: 13.6 % (ref 11.5–14.6)
Sodium: 137 meq/L (ref 135–145)
Specific Gravity, Urine: 1.015
TSH: 1.92 microintl units/mL (ref 0.35–5.50)
Total Bilirubin: 0.4 mg/dL (ref 0.3–1.2)
Total CHOL/HDL Ratio: 3
Total Protein: 7.1 g/dL (ref 6.0–8.3)
Triglycerides: 102 mg/dL (ref 0.0–149.0)
Urobilinogen, UA: 0.2
VLDL: 20.4 mg/dL (ref 0.0–40.0)
WBC Urine, dipstick: NEGATIVE
WBC: 7 10*3/uL (ref 4.5–10.5)
pH: 8

## 2010-06-08 ENCOUNTER — Ambulatory Visit: Payer: Self-pay | Admitting: Internal Medicine

## 2010-06-08 DIAGNOSIS — R1310 Dysphagia, unspecified: Secondary | ICD-10-CM | POA: Insufficient documentation

## 2010-07-11 ENCOUNTER — Encounter: Payer: Self-pay | Admitting: Internal Medicine

## 2010-07-13 ENCOUNTER — Encounter (INDEPENDENT_AMBULATORY_CARE_PROVIDER_SITE_OTHER): Payer: Self-pay | Admitting: *Deleted

## 2010-07-15 ENCOUNTER — Encounter: Payer: Self-pay | Admitting: Internal Medicine

## 2010-07-22 ENCOUNTER — Ambulatory Visit: Payer: Self-pay | Admitting: Internal Medicine

## 2010-07-23 ENCOUNTER — Encounter: Payer: Self-pay | Admitting: Neurosurgery

## 2010-08-02 ENCOUNTER — Encounter: Payer: Self-pay | Admitting: Internal Medicine

## 2010-08-02 ENCOUNTER — Ambulatory Visit
Admission: RE | Admit: 2010-08-02 | Discharge: 2010-08-02 | Payer: Self-pay | Source: Home / Self Care | Attending: Internal Medicine | Admitting: Internal Medicine

## 2010-08-04 NOTE — Cardiovascular Report (Signed)
Summary: Certified Letter Signed - Patient (not doing f/u)  Certified Letter Signed - Patient (not doing f/u)   Imported By: Ranell Patrick 07/21/2010 11:11:16  _____________________________________________________________________  External Attachment:    Type:   Image     Comment:   External Document

## 2010-08-04 NOTE — Cardiovascular Report (Signed)
Summary: TTM   TTM   Imported By: Sallee Provencal 11/25/2009 11:50:55  _____________________________________________________________________  External Attachment:    Type:   Image     Comment:   External Document

## 2010-08-04 NOTE — Medication Information (Signed)
Summary: [facility]in Clinic    Referring MD: Crissie Sickles PCP: [name] MD Indication 1: Atrial Fibrillation (ICD-427.31) Lab Used: [facility] Site: [facility] INR RANGE 2 - 3           Allergies: No Known Drug Allergies  Anticoagulation Management History:      Negative risk factors for bleeding include an age less than 65 years old.  The bleeding index is 'low risk'.  Positive CHADS2 values include History of HTN.  Negative CHADS2 values include Age > 25 years old.  The start date was 04/02/2006.  Her last INR was 2.0 RATIO.    Anticoagulation Management Assessment/Plan:      The patient's current anticoagulation dose is Warfarin sodium 3 mg tabs: Take as directed.  The target INR is 2 - 3.         Prior Anticoagulation Instructions: 3MG  QD/1.5MG  SAT  Appended Document: [facility] Letter mailed to patient

## 2010-08-04 NOTE — Assessment & Plan Note (Signed)
Summary: CPX // RS   Vital Signs:  Patient profile:   65 year old female Menstrual status:  hysterectomy Height:      66 inches Weight:      240 pounds BMI:     38.88 Pulse rate:   66 / minute BP sitting:   120 / 80  (left arm) Cuff size:   large  Vitals Entered By: Sherron Monday, CMA (AAMA) (June 08, 2010 10:03 AM)  Nutrition Counseling: Patient's BMI is greater than 25 and therefore counseled on weight management options. CC: CPX   History of Present Illness: [name] comes in today  for preventive visit  and medical  issues  follow up. Since last visit no major changes in health status  CV:Dr Nahser  change   from coumadin to pradaxa  for 2 months .   no se.  ? No change in Cardiac status. No cp sob  Yesterday had  mid chest  dysphaga. hard to swallow and now ok although had  high epigastic  discomfort  during visit today  ... takes all of her pill in her mouth at one time!  with 6-8 oz water .   Fell pumping gas and fell on left elbow and shoulder . sore in shoulder .  getting better   OSa Using  machine every night and doing well.    feels better on this . Obesity : most problematic.  know waht to do just not doing it.  as much .   soical events  no reg exercise   Preventive Care Screening  Prior Values:    Mammogram:  normal (04/12/2009)    Colonoscopy:  normal (07/03/2005)    Last Tetanus Booster:  Tdap (06/02/2009)    Last Flu Shot:  Fluvax 3+ (03/01/2010)    Last Pneumovax:  Historical (07/04/2007)   Preventive Screening-Counseling & Management  Alcohol-Tobacco     Alcohol drinks/day: <1     Alcohol type: wine     Smoking Status: quit     Packs/Day: 1.0     Year Started: 1979     Year Quit: 1984  Caffeine-Diet-Exercise     Caffeine use/day: 1     Does Patient Exercise: no  Hep-HIV-STD-Contraception     Dental Visit-last 6 months no     Sun Exposure-Excessive: no  Safety-Violence-Falls     Seat Belt Use: yes     Firearms in the Home:  firearms in the home     Firearm Counseling: to practice firearm safety     Smoke Detectors: yes     Fall Risk: fell once   Current Medications (verified): 1)  Tikosyn 250 Mcg Caps (Dofetilide) .[name].. 1 Two Times A Day 2)  Pradaxa 150 Mg Caps (Dabigatran Etexilate Mesylate) .[name].. 1 By Mouth Two Times A Day 3)  Cozaar 100 Mg Tabs (Losartan Potassium) .[name].. 1 Tablet By Mouth Once Daily 4)  Metoprolol Succinate 100 Mg Xr24h-Tab (Metoprolol Succinate) .... Take 1 Tablet in The Morning and 1/2 Tablet Inthe Evening 5)  Indapamide 2.5 Mg Tabs (Indapamide) .... 1/2 Qam 6)  Fluoxetine Hcl 20 Mg Caps (Fluoxetine Hcl) .[name].. 1 Qam 7)  Klor-Con M20 20 Meq Cr-Tabs (Potassium Chloride Crys Cr) .[name].. 1 Qam 8)  Lysine Hcl 1000 Mg Tabs (Lysine Hcl) .... As Needed 9)  Aspirin 325 Mg  Tabs (Aspirin) .... Take 1 Tablet By Mouth Once A Day 10)  Multivitamins   Tabs (Multiple Vitamin) .... Take 1 Tablet By Mouth Once  A Day 11)  Calcium Carbonate   Powd (Calcium Carbonate) .... Take 1 Tablet By Mouth Once A Day 12)  Vitamin D 1000 Unit Caps (Cholecalciferol) .... Take 1 Tablet By Mouth Once A Day 13)  Cpap .... At Bedtime 14)  Valacyclovir Hcl 1 Gm Tabs (Valacyclovir Hcl) .... Take 2 By Mouth and Repeat in 12 Hours For Cold Sores 15)  Ketoconazole 2 % Crea (Ketoconazole) .... Apply To Rash Two Times A Day For Up To 2 Weeks 16)  Lipitor 40 Mg Tabs (Atorvastatin Calcium) .[name].. 1 By Mouth Once Daily  Allergies (verified): No Known Drug Allergies  Past History:  Past medical, surgical, family and social histories (including risk factors) reviewed, and no changes noted (except as noted below).  Past Medical History: Reviewed history from 06/02/2009 and no changes required. Cerebrovascular accident, hx of Coronary artery disease  history of stents 1989, 2003  Hypertension Depression chronic atrial fib  tachybradycardia pacemaker 12/08       LAST Mammogram: 2008 Pap: hyst Td: more than 5 years  Colonscopy: 3  years ago EKG: 05/15/08 Dexa: 3year ago  ok Eye Exam: 3 years ago  Zostavax  Other: coumadin therapy.  Smoking: former Consults Dr. Trenton Gammon Dr. Lovena Le Dr. Acie Fredrickson Blood transfusion Allergic rhinitis  Past Surgical History: Reviewed history from 06/02/2009 and no changes required. Hysterectomy bbilateral oopherectomy 1984  2 surgeries   endometriosis Permanent pacemaker 12 08  Back Surgeries x 2-3 Dr. Trenton Gammon G0 P0 C Stent Cholecystectomy 1984  Past History:  Care Management: Cardiology: Dr. Lovena Le, Dr. Jasper Riling Pulmonary: [facility]  Family History: Reviewed history from 10/12/2008 and no changes required. Father: Died sucide- pt age 63 Mother: heart arrthymia Siblings: Brother died of heart attack age 34, 70 health problems paternal aunt: heart disease cancer: maternal grandfather (prostate)   Social History: Reviewed history from 12/30/2009 and no changes required. Married  originally from [place] of 2   HS education Former Smoker.  started 1979.  1ppd.  quit 1984 Alcohol use-yes Drug use-no Regular exercise-no hunting dogs. pt does not have any children. pt is retired.  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 sleeps on one pillow no pnd 12 system review  other wise no change or as per hpi   Physical Exam  General:  Well-developed,well-nourished,in no acute distress; alert,appropriate and cooperative throughout examination Head:  normocephalic and atraumatic.   Eyes:  PERRL, EOMs full, conjunctiva clear  Ears:  R ear normal, L ear normal, and no external deformities.   Nose:  no external deformity and no nasal discharge.   Mouth:  pharynx pink and moist.   Neck:  No deformities, masses, or tenderness noted. Lungs:  Normal respiratory effort, chest expands symmetrically. Lungs are clear to  auscultation, no crackles or wheezes. Heart:  Normal rate and regular rhythm. S1 and S2 normal without gallop, murmur, click, rub or other extra sounds. Abdomen:  Bowel sounds positive,abdomen soft and non-tender without masses, organomegaly or hernias noted. Genitalia:  NI Msk:  no joint warmth, no redness over joints, and no joint deformities.  lefthpouldder edec rom elevation  food roiations Pulses:  pulses intact without delay   Extremities:  no clubbing cyanosis or edema  Neurologic:  alert & oriented X3, strength normal in all extremities, and gait normal.  Skin:  turgor normal, color normal, no ecchymoses, and no petechiae.   Cervical Nodes:  No lymphadenopathy noted Axillary Nodes:  No palpable lymphadenopathy Inguinal Nodes:  No significant adenopathy Psych:  Normal eye contact, appropriate affect. Cognition appears normal.    Impression & Recommendations:  Problem # 1:  HEALTH MAINTENANCE EXAM, ADULT (ICD-V70.0) Discussed nutrition,exercise,diet,healthy weight, vitamin D and calcium.     utd on   immuniz  Problem # 2:  HYPERTENSION (ICD-401.9)  Her updated medication list for this problem includes:    Cozaar 100 Mg Tabs (Losartan potassium) .[name]... 1 tablet by mouth once daily    Metoprolol Succinate 100 Mg Xr24h-tab (Metoprolol succinate) .[name]... Take 1 tablet in the morning and 1/2 tablet inthe evening    Indapamide 2.5 Mg Tabs (Indapamide) .[name]... 1/2 qam  BP today: 120/80 Prior BP: 120/80 (03/01/2010)  Prior 10 Yr Risk Heart Disease: N/A (09/15/2008)  Labs Reviewed: K+: 4.7 (06/01/2010) Creat: : 0.9 (06/01/2010)   Chol: 135 (06/01/2010)   HDL: 51.60 (06/01/2010)   LDL: 63 (06/01/2010)   TG: 102.0 (06/01/2010)  Problem # 3:  HYPERLIPIDEMIA (ICD-272.4)  Her updated medication list for this problem includes:    Lipitor 40 Mg Tabs (Atorvastatin calcium) .[name]... 1 by mouth once daily  Labs Reviewed: SGOT: 31 (06/01/2010)   SGPT: 40 (06/01/2010)  Prior 10 Yr Risk Heart  Disease: N/A (09/15/2008)   HDL:51.60 (06/01/2010), 61.10 (02/24/2010)  LDL:63 (06/01/2010), 64 (02/24/2010)  Chol:135 (06/01/2010), 144 (02/24/2010)  Trig:102.0 (06/01/2010), 93.0 (02/24/2010)  Problem # 4:  OBSTRUCTIVE SLEEP APNEA (ICD-327.23) Assessment: Comment Only under rx with help  Problem # 5:  ATRIAL FIBRILLATION, PAROXYSMAL (ICD-427.31)  Her updated medication list for this problem includes:    Tikosyn 250 Mcg Caps (Dofetilide) .[name]... 1 two times a day    Metoprolol Succinate 100 Mg Xr24h-tab (Metoprolol succinate) .[name]... Take 1 tablet in the morning and 1/2 tablet inthe evening    Aspirin 325 Mg Tabs (Aspirin) .[name]... Take 1 tablet by mouth once a day  Problem # 6:  Hx of DYSPHAGIA UNSPECIFIED (ICD-787.20) after taking all pills at once!  usually odes this .   disc this and  avoiding pill dysphagia.   call if persistent or  progressive  ...also may need to decresase size of potassium pill to 10 meq and take 2  per day  Problem # 7:  PACEMAKER, PERMANENT (ICD-V45.01)  Problem # 8:  HYPERGLYCEMIA (ICD-790.29) prediabetic readings    .  counseled improtance of this  weight watchers lifestyle change strategies. Labs Reviewed: Creat: 0.9 (06/01/2010)     Problem # 9:  CORONARY ARTERY DISEASE (ICD-414.00) per dr Kristopher Oppenheim Her updated medication list for this problem includes:    Tikosyn 250 Mcg Caps (Dofetilide) .[name]... 1 two times a day    Cozaar 100 Mg Tabs (Losartan potassium) .[name]... 1 tablet by mouth once daily    Metoprolol Succinate 100 Mg Xr24h-tab (Metoprolol succinate) .[name]... Take 1 tablet in the morning and 1/2 tablet inthe evening    Indapamide 2.5 Mg Tabs (Indapamide) .[name]... 1/2 qam    Aspirin 325 Mg Tabs (Aspirin) .[name]... Take 1 tablet by mouth once a day  Problem # 10:  LONG-TERM (CURRENT) USE OF ANTICOAGULANTS (ICD-V58.61) pradaxa   Complete Medication List: 1)  Tikosyn 250 Mcg Caps (Dofetilide) .[name].. 1 two times a day 2)  Pradaxa 150 Mg Caps (Dabigatran etexilate  mesylate) .[name].. 1 by mouth two times a day 3)  Cozaar 100 Mg Tabs (Losartan potassium) .Marland KitchenMarland KitchenMarland Kitchen 1  tablet by mouth once daily 4)  Metoprolol Succinate 100 Mg Xr24h-tab (Metoprolol succinate) .... Take 1 tablet in the morning and 1/2 tablet inthe evening 5)  Indapamide 2.5 Mg Tabs (Indapamide) .... 1/2 qam 6)  Fluoxetine Hcl 20 Mg Caps (Fluoxetine hcl) .[name].. 1 qam 7)  Klor-con M20 20 Meq Cr-tabs (Potassium chloride crys cr) .[name].. 1 qam 8)  Lysine Hcl 1000 Mg Tabs (Lysine hcl) .... As needed 9)  Aspirin 325 Mg Tabs (Aspirin) .... Take 1 tablet by mouth once a day 10)  Multivitamins Tabs (Multiple vitamin) .... Take 1 tablet by mouth once a day 11)  Calcium Carbonate Powd (Calcium carbonate) .... Take 1 tablet by mouth once a day 12)  Vitamin D 1000 Unit Caps (Cholecalciferol) .... Take 1 tablet by mouth once a day 13)  Cpap  .... At bedtime 14)  Valacyclovir Hcl 1 Gm Tabs (Valacyclovir hcl) .... Take 2 by mouth and repeat in 12 hours for cold sores 15)  Ketoconazole 2 % Crea (Ketoconazole) .... Apply to rash two times a day for up to 2 weeks 16)  Lipitor 40 Mg Tabs (Atorvastatin calcium) .[name].. 1 by mouth once daily  Patient Instructions: 1)  consider changing the potassium  to smaller dosing 10  and take 2 per day.  Call about this  2)  Do not take   all your pills at one time. 3)  Avoid sweets and sugars  continue  to try to lose weight as we discussed.  4)  Hg a1c in 4 months and then ROV .   Orders Added: 1)  Est. Patient 40-64 years [id number] 2)  Est. Patient Level III [id number]

## 2010-08-04 NOTE — Letter (Signed)
Summary: Device-Delinquent Check  [place] [place] [address] [place]   [place], [place] [address]   Phone: [phone]  Fax: [phone]     March 21, 2010 MRN: [id number]   [facility] [address] [place], [place]  [address]   Dear [name],  According to our records, you have not had your implanted device checked in the recommended period of time.  We are unable to determine appropriate device function without checking your device on a regular basis.  Please call our office to schedule an appointment, with Dr. Lovena Le,  as soon as possible.  If you are having your device checked by another physician, please call us so that we may update our records.  Thank you,  Alma Friendly, LPN  September 19, [id number] 11:07 AM  [facility]

## 2010-08-04 NOTE — Medication Information (Signed)
Summary: Coumadin Clinic  Anticoagulant Therapy  Managed by: Inactive Referring MD: Crissie Sickles PCP: [name] MD Supervising MD: Lia Foyer MD, Marcello Moores Indication 1: Atrial Fibrillation (ICD-427.31) Lab Used: [facility] Site: [facility] INR RANGE 2 - 3          Comments: Per OV note 11/17/08 from [facility] Dr Cathie Olden follows and doses her coumadin.    Allergies: No Known Drug Allergies  Anticoagulation Management History:      Negative risk factors for bleeding include an age less than 53 years old.  The bleeding index is 'low risk'.  Positive CHADS2 values include History of HTN.  Negative CHADS2 values include Age > 30 years old.  The start date was 04/02/2006.  Her last INR was 2.0 RATIO.  Anticoagulation responsible provider: Lia Foyer MD, Marcello Moores.    Anticoagulation Management Assessment/Plan:      The patient's current anticoagulation dose is Warfarin sodium 3 mg tabs: Take as directed.  The target INR is 2 - 3.  Results were reviewed/authorized by Inactive.         Prior Anticoagulation Instructions: 3MG  QD/1.5MG  SAT

## 2010-08-04 NOTE — Assessment & Plan Note (Signed)
Summary: follow up on meds/ssc   Vital Signs:  Patient profile:   65 year old female Menstrual status:  hysterectomy Height:      66 inches Weight:      230 pounds BMI:     37.26 Pulse rate:   72 / minute BP sitting:   110 / 80  (right arm) Cuff size:   large  Vitals Entered By: Sherron Monday, CMA (AAMA) (December 30, 2009 8:25 AM) CC: Follow-up visit on meds, Hypertension Management   History of Present Illness: [name] comes in today  for follow up of multiple medical problems  Since last visit  here  there have been no major changes in health status  . Mood ; on med for 10 years    stable  LIPIDs:  No se of meds  HT  stable  Cold sores : needs refill  has used acyclovir  as needed  CAD PAcer : stable and to see Dr Lovena Le   this summer.  New problem : Rash  in inguinal area    and gets itching   used [facility] med  .  some help there for 3 months .  sees dr Acie Fredrickson q 6 months and  also Dr Lovena Le for pacer check  Coumadin : monitoring monthly der [facility] .       Hypertension History:      She denies headache, chest pain, palpitations, dyspnea with exertion, orthopnea, PND, peripheral edema, visual symptoms, neurologic problems, syncope, and side effects from treatment.  She notes no problems with any antihypertensive medication side effects.        Positive major cardiovascular risk factors include female age 71 years old or older, hyperlipidemia, and hypertension.  Negative major cardiovascular risk factors include non-tobacco-user status.        Positive history for target organ damage include ASHD (either angina/prior MI/prior CABG).     Preventive Screening-Counseling & Management  Alcohol-Tobacco     Alcohol drinks/day: <1     Alcohol type: wine     Smoking Status: quit     Packs/Day: 1.0     Year Started: 1979     Year Quit: 1984  Caffeine-Diet-Exercise     Caffeine use/day: 1     Does Patient Exercise: no  Current Medications (verified): 1)  Tikosyn 250 Mcg  Caps (Dofetilide) .[name].. 1 Two Times A Day 2)  Warfarin Sodium 3 Mg Tabs (Warfarin Sodium) .... Take 2 1/2 Mg For 6 Days and 5 Mg For 1 Day 3)  Cozaar 100 Mg Tabs (Losartan Potassium) .[name].. 1 Tablet By Mouth Once Daily 4)  Metoprolol Succinate 100 Mg Xr24h-Tab (Metoprolol Succinate) .... Take 1 Tablet in The Morning and 1/2 Tablet Inthe Evening 5)  Indapamide 2.5 Mg Tabs (Indapamide) .... 1/2 Qam 6)  Fluoxetine Hcl 20 Mg Caps (Fluoxetine Hcl) .[name].. 1 Qam 7)  Klor-Con M20 20 Meq Cr-Tabs (Potassium Chloride Crys Cr) .[name].. 1 Qam 8)  Lysine Hcl 1000 Mg Tabs (Lysine Hcl) .... As Needed 9)  Aspirin 325 Mg  Tabs (Aspirin) .... Take 1 Tablet By Mouth Once A Day 10)  Multivitamins   Tabs (Multiple Vitamin) .... Take 1 Tablet By Mouth Once A Day 11)  Calcium Carbonate   Powd (Calcium Carbonate) .... Take 1 Tablet By Mouth Once A Day 12)  Vitamin D 1000 Unit Caps (Cholecalciferol) .... Take 1 Tablet By Mouth Once A Day 13)  Cpap .... At Bedtime 14)  Simvastatin 80 Mg Tabs (Simvastatin) .Marland KitchenMarland KitchenMarland Kitchen  Take One Tablet By Mouth Daily At Bedtime 15)  Zovirax 400 Mg Tabs (Acyclovir) .[name].. 1 By Mouth Three Times A Day  Allergies (verified): No Known Drug Allergies  Past History:  Past medical, surgical, family and social histories (including risk factors) reviewed, and no changes noted (except as noted below).  Past Medical History: Reviewed history from 06/02/2009 and no changes required. Cerebrovascular accident, hx of Coronary artery disease  history of stents 1989, 2003  Hypertension Depression chronic atrial fib  tachybradycardia pacemaker 12/08       LAST Mammogram: 2008 Pap: hyst Td: more than 5 years  Colonscopy: 3 years ago EKG: 05/15/08 Dexa: 3year ago  ok Eye Exam: 3 years ago  Zostavax  Other: coumadin therapy.  Smoking: former Consults Dr. Trenton Gammon Dr. Lovena Le Dr. Acie Fredrickson Blood transfusion Allergic rhinitis  Past Surgical History: Reviewed history from 06/02/2009 and no changes  required. Hysterectomy bbilateral oopherectomy 1984  2 surgeries   endometriosis Permanent pacemaker 12 08  Back Surgeries x 2-3 Dr. Trenton Gammon G0 P0 C Stent Cholecystectomy 1984  Past History:  Care Management: Cardiology: Dr. Lovena Le, Dr. Jasper Riling Pulmonary: [facility]  Family History: Reviewed history from 10/12/2008 and no changes required. Father: Died sucide- pt age 24 Mother: heart arrthymia Siblings: Brother died of heart attack age 57, 110 health problems paternal aunt: heart disease cancer: maternal grandfather (prostate)   Social History: Reviewed history from 10/12/2008 and no changes required. Married  originally from [place] of 2   HS education Former Smoker.  started 1979.  1ppd.  quit 1984 Alcohol use-yes Drug use-no Regular exercise-no hunting dogs. pt does not have any children. pt is retired.  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    Physical Exam  General:  Well-developed,well-nourished,in no acute distress; alert,appropriate and cooperative throughout examination Head:  normocephalic and atraumatic.   Eyes:  PERRL, EOMs full, conjunctiva clear  Ears:  R ear normal and L ear normal.   Neck:  No deformities, masses, or tenderness noted. Lungs:  Normal respiratory effort, chest expands symmetrically. Lungs are clear to auscultation, no crackles or wheezes.no dullness.   Heart:  normal rate, regular rhythm, no murmur, and no gallop.   Abdomen:  soft, non-tender, normal bowel sounds, no hepatomegaly, and  no splenomegaly.   Pulses:  pulses intact without delay   Extremities:  no clubbing cyanosis or edema  Neurologic:  alert & oriented X3 and strength normal in all extremities.   Skin:  mild intertrigo with discrete border    no weeping or pustules  turgor normal, no ecchymoses, no petechiae, and no purpura.   Cervical Nodes:  No lymphadenopathy noted Psych:  Oriented X3, normally interactive, good eye contact, not anxious appearing, and not depressed appearing.     Impression & Recommendations:  Problem # 1:  HYPERLIPIDEMIA (ICD-272.4)  no obv se of 80 mg simva but with  higher risk and  lipitor going generic will  give a trial of 40 with samples and  copay card.   Her updated medication list for this problem includes:    Simvastatin 80 Mg Tabs (Simvastatin) .[name]... Take one tablet by mouth daily at bedtime  Lipitor 40 Mg Tabs (Atorvastatin calcium) .[name]... 1 by mouth once daily  Labs Reviewed: SGOT: 22 (05/31/2009)   SGPT: 33 (05/31/2009)  Prior 10 Yr Risk Heart Disease: N/A (09/15/2008)   HDL:51.90 (05/31/2009), 59.5 (06/22/2008)  LDL:60 (05/31/2009), 62 (06/22/2008)  Chol:138 (05/31/2009), 142 (06/22/2008)  Trig:130.0 (05/31/2009), 105 (06/22/2008)  Problem # 2:  HYPERTENSION (ICD-401.9) Assessment: Unchanged  Her updated medication list for this problem includes:    Cozaar 100 Mg Tabs (Losartan potassium) .[name]... 1 tablet by mouth once daily    Metoprolol Succinate 100 Mg Xr24h-tab (Metoprolol succinate) .[name]... Take 1 tablet in the morning and 1/2 tablet inthe evening    Indapamide 2.5 Mg Tabs (Indapamide) .[name]... 1/2 qam  BP today: 110/80 Prior BP: 120/80 (06/02/2009)  Prior 10 Yr Risk Heart Disease: N/A (09/15/2008)  Labs Reviewed: K+: 3.9 (05/31/2009) Creat: : 0.8 (05/31/2009)   Chol: 138 (05/31/2009)   HDL: 51.90 (05/31/2009)   LDL: 60 (05/31/2009)   TG: 130.0 (05/31/2009)  Problem # 3:  INTERTRIGO (ICD-695.89) mild   disc  rx .espm   Problem # 4:  DEPRESSION, HX OF  (ICD-V11.8) doing well continue meds   Problem # 5:  CORONARY ARTERY DISEASE (ICD-414.00) Assessment: Unchanged  Her updated medication list for this problem includes:    Tikosyn 250 Mcg Caps (Dofetilide) .[name]... 1 two times a day    Cozaar 100 Mg Tabs (Losartan potassium) .[name]... 1 tablet by mouth once daily    Metoprolol Succinate 100 Mg Xr24h-tab (Metoprolol succinate) .[name]... Take 1 tablet in the morning and 1/2 tablet inthe evening    Indapamide 2.5 Mg Tabs (Indapamide) .[name]... 1/2 qam    Aspirin 325 Mg Tabs (Aspirin) .[name]... Take 1 tablet by mouth once a day  Problem # 6:  COUMADIN THERAPY (ICD-V58.61) stable   Problem # 7:  HERPES LABIALIS (ICD-054.9) rx as   as directed .   Complete Medication List: 1)  Tikosyn 250 Mcg Caps (Dofetilide) .[name].. 1 two times a day 2)  Warfarin Sodium 3 Mg Tabs (Warfarin sodium) .... Take 2 1/2 mg for 6 days and 5 mg for 1 day 3)  Cozaar 100 Mg Tabs (Losartan potassium) .[name].. 1 tablet by mouth once daily 4)  Metoprolol Succinate 100 Mg Xr24h-tab (Metoprolol succinate) .... Take 1 tablet in the morning and 1/2 tablet inthe evening 5)  Indapamide 2.5 Mg Tabs (Indapamide) .... 1/2 qam 6)  Fluoxetine Hcl 20 Mg Caps (Fluoxetine hcl) .[name].. 1 qam 7)  Klor-con M20 20 Meq Cr-tabs (Potassium chloride crys cr) .[name].. 1 qam 8)  Lysine Hcl 1000 Mg Tabs (Lysine hcl) .... As needed 9)  Aspirin 325 Mg Tabs (Aspirin) .... Take 1 tablet by mouth once a day 10)  Multivitamins Tabs (Multiple vitamin) .... Take 1 tablet by mouth once a day 11)  Calcium Carbonate Powd (Calcium carbonate) .... Take 1 tablet by mouth once a day 12)  Vitamin D 1000 Unit Caps (Cholecalciferol) .... Take 1 tablet by mouth once a day 13)  Cpap  .... At bedtime 14)  Simvastatin 80 Mg Tabs (Simvastatin) .... Take one tablet by mouth daily at bedtime 15)  Zovirax 400 Mg Tabs (Acyclovir) .[name].. 1 by mouth three times a day 16)  Valacyclovir Hcl 1 Gm Tabs (Valacyclovir hcl) .... Take 2 by mouth and repeat in 12  hours for cold sores 17)  Ketoconazole 2 % Crea (Ketoconazole) .... Apply to rash two times a day for up to 2 weeks 18)  Lipitor 40 Mg Tabs (Atorvastatin calcium) .Marland KitchenMarland KitchenMarland Kitchen  1 by mouth once daily  Hypertension Assessment/Plan:      The patient's hypertensive risk group is category C: Target organ damage and/or diabetes.  Today's blood pressure is 110/80.  Her blood pressure goal is < 140/90.  Patient Instructions: 1)  keep groin area dry  2)  use cream for yeast also  3)  call if persistent or  progressive . 4)  change to lipitor   from simvastatin  5)  LIPIDS,LFTS,Hga1c,BMP 6)  In 2 months and then ROV.  Prescriptions: KLOR-CON M20 20 MEQ CR-TABS (POTASSIUM CHLORIDE CRYS CR) 1 qam  #30 x 5   Entered and Authorized by:   [name] MD   Signed by:   [name] MD on 12/30/2009   Method used:   Electronically to        [facility] (retail)       [place], [place]  [address]       Ph: [phone] or [phone]       Fax: [phone]   RxID:   [id number] FLUOXETINE HCL 20 MG CAPS (FLUOXETINE HCL) 1 qam  #30 Capsule x 12   Entered and Authorized by:   [name] MD   Signed by:   [name] MD on 12/30/2009   Method used:   Electronically to        [facility] [phone]* (retail)       [place], [place]  [address]       Ph: [phone] or [phone]       Fax: [phone]   RxID:   [id number] INDAPAMIDE 2.5 MG TABS (INDAPAMIDE) 1/2 qam  #15 Tablet x 5   Entered and Authorized by:   [name] MD   Signed by:   [name] MD on 12/30/2009   Method used:   Electronically to        [facility] (retail)       [address]       [place], [place]  [address]       Ph: [phone] or [phone]       Fax: [phone]   RxID:   [id number] KETOCONAZOLE 2 % CREA (KETOCONAZOLE) apply to rash two times a day for up to 2 weeks  #30 gm x 1   Entered and Authorized by:   [name] MD   Signed by:    [name] MD on 12/30/2009   Method used:   Electronically to        [facility] (retail)       [address]       [place], [place]  [address]       Ph: [phone] or [phone]       Fax: [phone]   RxID:   [id number] VALACYCLOVIR HCL 1 GM TABS (VALACYCLOVIR HCL) take 2 by mouth and repeat in 12 hours for cold sores  #30 x 3   Entered and Authorized by:   [name] MD   Signed by:   [name] MD on 12/30/2009   Method used:   Electronically to        [facility] [phone]* (retail)       [address]       [place],   [address]       Ph:  [phone] or [phone]       Fax: [phone]   [id number]

## 2010-08-04 NOTE — Cardiovascular Report (Signed)
Summary: TTM   TTM   Imported By: Sallee Provencal 07/21/2010 16:28:26  _____________________________________________________________________  External Attachment:    Type:   Image     Comment:   External Document

## 2010-08-04 NOTE — Progress Notes (Signed)
Summary: refills   Phone Note From Pharmacy   Caller: [facility] Reason for Call: Needs renewal Details for Reason: Pot chlor and indapamide and fluoxetine Summary of Call: Pt has never used [facility] before. I tried to call pt but the number was busy. I will try again tomorrow. Initial call taken by: Sherron Monday, CMA Deborra Medina),  March 02, 2010 5:12 PM  Follow-up for Phone Call        Spoke with pt and she wants [place] to go ahead with this. Follow-up by: Sherron Monday, CMA (AAMA),  March 03, 2010 8:47 AM    Prescriptions: KLOR-CON M20 20 MEQ CR-TABS (POTASSIUM CHLORIDE CRYS CR) 1 qam  #90 x 2   Entered by:   Sherron Monday, CMA (AAMA)   Authorized by:   [name] MD   Signed by:   Sherron Monday, CMA (AAMA) on 03/03/2010   Method used:   Electronically to        [facility] (retail)             ,          Ph: [phone]       Fax: [phone]   RxID:   [id number] FLUOXETINE HCL 20 MG CAPS (FLUOXETINE HCL) 1 qam  #90 x 2   Entered by:   Sherron Monday, CMA (AAMA)   Authorized by:   [name] MD   Signed by:   Sherron Monday, CMA (AAMA) on 03/03/2010   Method used:   Electronically to        [facility] (retail)             ,          Ph: [phone]       Fax: [phone]   [id number] INDAPAMIDE 2.5 MG TABS (INDAPAMIDE) 1/2 qam  #45 x 2   Entered by:   Sherron Monday, CMA (AAMA)   Authorized by:   [name] MD   Signed by:   Sherron Monday, CMA (AAMA) on 03/03/2010   Method used:   Electronically to        [facility]* (retail)             ,          Ph: [phone]       Fax: [phone]   RxID:   [id number]

## 2010-08-04 NOTE — Assessment & Plan Note (Signed)
Summary: rov/jml  Medications Added COZAAR 100 MG TABS (LOSARTAN POTASSIUM) 1 1/2 tablets by mouth once daily METOPROLOL SUCCINATE 50 MG XR24H-TAB (METOPROLOL SUCCINATE) 2 qam and at bedtime * CPAP at bedtime      Allergies Added: NKDA  Visit Type:  ROV Referring Provider:  [name] Primary Provider:  [name] MD  CC:  No problems.  History of Present Illness: Ms. Galley returns today for followup.  She has a h/o PAF, HTN, CAD and has recently been diagnosed with sleep apnea and has had CPAP initiated.  She denies c/p, sob, peripheral edema and palpitations.  She has had trouble with her diet and states that she needs some dietary recommendations.  Current Medications (verified): 1)  Tikosyn 250 Mcg Caps (Dofetilide) .[name].. 1 Two Times A Day 2)  Warfarin Sodium 3 Mg Tabs (Warfarin Sodium) .... Take As Directed 3)  Cozaar 100 Mg Tabs (Losartan Potassium) .[name].. 1 1/2 Tablets By Mouth Once Daily 4)  Metoprolol Succinate 50 Mg Xr24h-Tab (Metoprolol Succinate) .... 2 Qam and 1 At Bedtime 5)  Simvastatin 80 Mg Tabs (Simvastatin) .[name].. 1 By Mouth Once Daily 6)  Indapamide 2.5 Mg Tabs (Indapamide) .... 1/2 Qam 7)  Fluoxetine Hcl 20 Mg Caps (Fluoxetine Hcl) .[name].. 1 Qam 8)  Klor-Con M20 20 Meq Cr-Tabs (Potassium Chloride Crys Cr) .[name].. 1 Qam 9)  Lysine Hcl 1000 Mg Tabs (Lysine Hcl) .... As Needed 10)  Aspirin 325 Mg  Tabs (Aspirin) .... Take 1 Tablet By Mouth Once A Day 11)  Multivitamins   Tabs (Multiple Vitamin) .... Take 1 Tablet By Mouth Once A Day 12)  Calcium Carbonate   Powd (Calcium Carbonate) .... Take 1 Tablet By Mouth Once A Day 13)  Vitamin D 1000 Unit Caps (Cholecalciferol) .... Take 1 Tablet By Mouth Once A Day 14)  Cpap .... At Bedtime  Allergies (verified): No Known Drug Allergies  Past History:  Past Medical History: Last updated: 09/15/2008 Cerebrovascular accident, hx of Coronary artery disease  history of stents 1989, 2003  Hypertension Depression chronic  atrial fib  tachybradycardia pacemaker 12/08       LAST Mammogram: 2008 Pap: hyst Td: within 10 years Colonscopy: 3 years ago EKG: 05/15/08 Dexa: 2 year ago  ok Eye Exam: 3 years ago  Zostavax  Other: coumadin therapy.  Smoking: former Consults Dr. Trenton Gammon Dr. Lovena Le Dr. Acie Fredrickson Blood transfusion Allergic rhinitis  Past Surgical History: Last updated: 09/15/2008 Hysterectomy oopherectomy 1984  2 surgeries   both ovaries gone. Permanent pacemaker 12 08  Back Surgies x 2-3 Dr. Trenton Gammon G0 P0 C Stent Cholecystectomy 1984  Review of Systems  The patient denies chest pain, syncope, dyspnea on exertion, and peripheral edema.    Vital Signs:  Patient profile:   65 year old female Menstrual status:  hysterectomy Height:      66 inches Weight:      233.5 pounds BMI:     37.82 Pulse rate:   63 / minute Pulse rhythm:   regular BP sitting:   128 / 80  (right arm) Cuff size:   large  Vitals Entered By: Janan Halter, RN, BSN (January 22, 2009 8:43 AM)  Physical Exam  General:  Well developed, well nourished, in no acute distress. Head:  normocephalic and atraumatic Eyes:  PERRLA/EOM intact; conjunctiva and lids normal. Mouth:  Teeth, gums and palate normal. Oral mucosa normal. Neck:  Neck supple, no JVD. No masses, thyromegaly or abnormal cervical nodes. Chest Wall:  Well healed PPM  incision. Lungs:  Clear bilaterally with no wheezes, rales, or rhonchi.  No increased work of breathing. Heart:  RRR with normal S1 and S2.  PMI is enlarged or laterally displaced. Abdomen:  Bowel sounds positive; abdomen soft and non-tender without masses, organomegaly, or hernias noted. No hepatosplenomegaly.  Obese. Msk:  Back normal, normal gait. Muscle strength and tone normal. Pulses:  pulses normal in all 4 extremities Extremities:  No clubbing or cyanosis. Neurologic:  Alert and oriented x 3.   PPM Follow Up Remote Check?  No Battery Voltage:  2.80 V     Battery Est. Longevity:  >10  years     Pacer Dependent:  No       PPM Device Measurements Atrium  Amplitude: 1.9 mV, Impedance: 468 ohms, Threshold: 0.5 V at 0.4 msec Right Ventricle  Amplitude: >12 mV, Impedance: 410 ohms, Threshold: 1.0 V at 0.4 msec  Episodes MS Episodes:  167     Percent Mode Switch:  40%     Coumadin:  Yes Ventricular High Rate:  0     Atrial Pacing:  <1%     Ventricular Pacing:  <1%  Parameters Mode:  DDD     Lower Rate Limit:  50     Upper Rate Limit:  120 Paced AV Delay:  200     Sensed AV Delay:  200 Tech Comments:  TTM's with [employer] No changes Device function normal No mode switches since 11/14/2008 Alma Friendly, LPN  July 23, [id number] AM  MD Comments:  Normal device function.  Impression & Recommendations:  Problem # 1:  PACEMAKER, PERMANENT (ICD-V45.01) Her device is working normally today.  Will recheck in 6 months.  Problem # 2:  HYPERLIPIDEMIA (ICD-272.4) I have asked that she continue simvastatin as below.  A low fat diet has been discussed. Her updated medication list for this problem includes:    Simvastatin 80 Mg Tabs (Simvastatin) .[name]... 1 by mouth once daily  Problem # 3:  COUMADIN THERAPY (ICD-V58.61) She is tolerating her coumadin well.  If she maintains NSR exclusively over the next year, than I would consider stopping coumadin.  Problem # 4:  CORONARY ARTERY DISEASE (ICD-414.00) No anginal symptoms at present.  Continue her current meds as noted below. Her updated medication list for this problem includes:    Warfarin Sodium 3 Mg Tabs (Warfarin sodium) .[name]... Take as directed    Metoprolol Succinate 50 Mg Xr24h-tab (Metoprolol succinate) .[name]... 2 qam and at bedtime    Aspirin 325 Mg Tabs (Aspirin) .[name]... Take 1 tablet by mouth once a day  Patient Instructions: 1)  Your physician recommends that you schedule a follow-up appointment in: 6 months 2)  Your physician has recommended you make the following change in your medication: Decrease Toprol to 50mg  in the morning  and 50mg  in the evening.

## 2010-08-04 NOTE — Assessment & Plan Note (Signed)
Summary: consult for osa   Copy to:  [name] Primary Provider/Referring Provider:  [name] MD  CC:  Sleep Consult.  History of Present Illness: The pt is a very pleasant 65 y/o female who comes in today for evaluation of possible osa.  She has been noted to have loud snoring, as well as pauses in her breathing during sleep.  She goes to bed around 10pm, and arises between 6-8am to start her day.  She does not feel rested upon arising, and does note sleep pressure during the day with periods of inactivity.  She rarely takes a nap during the day.  She can doze easily while watching television, but only has rare sleep pressure with driving.  Her weight is up 10 pounds over the last 2 years, and her epworth score is 12 today  Current Medications (verified): 1)  Tikosyn 250 Mcg Caps (Dofetilide) .[name].. 1 Two Times A Day 2)  Warfarin Sodium 3 Mg Tabs (Warfarin Sodium) .... 3mg  Once Daily 3)  Cozaar 100 Mg Tabs (Losartan Potassium) .[name].. 1 By Mouth Once Daily 4)  Metoprolol Succinate 50 Mg Xr24h-Tab (Metoprolol Succinate) .... 2 Qam and 1 At Bedtime 5)  Simvastatin 80 Mg Tabs (Simvastatin) .[name].. 1 By Mouth Once Daily 6)  Indapamide 2.5 Mg Tabs (Indapamide) .... 1/2 Qam 7)  Fluoxetine Hcl 20 Mg Caps (Fluoxetine Hcl) .[name].. 1 Qam 8)  Klor-Con M20 20 Meq Cr-Tabs (Potassium Chloride Crys Cr) .[name].. 1 Qam 9)  Lysine Hcl 1000 Mg Tabs (Lysine Hcl) .... As Needed 10)  Aspirin 325 Mg  Tabs (Aspirin) .... Take 1 Tablet By Mouth Once A Day 11)  Multivitamins   Tabs (Multiple Vitamin) .... Take 1 Tablet By Mouth Once A Day 12)  Calcium Carbonate   Powd (Calcium Carbonate) .... Take 1 Tablet By Mouth Once A Day 13)  Vitamin D 1000 Unit Caps (Cholecalciferol) .... Take 1 Tablet By Mouth Once A Day  Allergies (verified): No Known Drug Allergies  Past History:  Past Medical History:    Cerebrovascular accident, hx of    Coronary artery disease  history of stents 1989, 2003     Hypertension  Depression    chronic atrial fib  tachybradycardia pacemaker 12/08          LAST    Mammogram: 2008    Pap: hyst    Td: within 10 years    Colonscopy: 3 years ago    EKG: 05/15/08    Dexa: 2 year ago  ok    Eye Exam: 3 years ago    Zostavax     Other: coumadin therapy.     Smoking: former    Consults    Dr. Trenton Gammon    Dr. Lovena Le    Dr. Acie Fredrickson    Blood transfusion    Allergic rhinitis     (09/15/2008)  Past Surgical History:    Hysterectomy oopherectomy 1984  2 surgeries   both ovaries gone.    Permanent pacemaker 12 08     Back Surgies x 2-3 Dr. Trenton Gammon    G0 P0    C Stent    Cholecystectomy 1984     (09/15/2008)  Family History:    Father: Died sucide- pt age 56    Mother: heart arrthymia    Siblings: Brother died of heart attack age 56, 104 health problems    paternal aunt: heart disease    cancer: maternal grandfather (prostate)   Social History:    Married  originally from [place] of 2   HS education    Former Smoker.  started 1979.  1ppd.  quit 1984    Alcohol use-yes    Drug use-no    Regular exercise-no    hunting dogs.    pt does not have any children.    pt is retired.  previously worked at [employer]    Packs/Day:  1.0  Review of Systems       The patient complains of shortness of breath with activity, irregular heartbeats, nasal congestion/difficulty breathing through nose, and joint stiffness or pain.  The patient denies shortness of breath at rest, productive cough, non-productive cough, coughing up blood, chest pain, acid heartburn, indigestion, loss of appetite, weight change, abdominal pain, difficulty swallowing, sore throat, tooth/dental problems, headaches, sneezing, itching, ear ache, anxiety, depression, hand/feet swelling, rash, change in color of mucus, and fever.    Vital Signs:  Patient profile:   65 year old female Menstrual status:  hysterectomy Height:      66 inches Weight:      233 pounds BMI:      37.74 O2 Sat:      94 % Temp:     97.7 degrees F oral Pulse rate:   68 / minute BP sitting:   132 / 80  (left arm) Cuff size:   large  Vitals Entered By: Matthew Folks LPN (April 12, 624THL [id number] PM)  O2 Sat on room air at rest %:  94 CC: Sleep Consult Is Patient Diabetic? No Comments Medications reviewed with patient Matthew Folks LPN  April 12, 624THL [id number] PM    Physical Exam  General:  ow female in nad Eyes:  PERRLA and EOMI.   Nose:  mild septal deviation to the right, but patent and without purulence Mouth:  mild elongation of the soft palate and uvula Neck:  no jvd, tmg, LN Lungs:  clear to auscultation Heart:  irregular rate and rhythm, with cvr no mrg Abdomen:  soft and nontender, bs+ Extremities:  no edema, pulses intact distally Neurologic:  alert and oriented, moves all 4   Impression & Recommendations:  Problem # 1:  OBSTRUCTIVE SLEEP APNEA (ICD-327.23) the pt gives a history which is suspicious for osa.  She is also obese, and has significant comorbid health issues that can be directly affected by osa.  I have had a long discussion with her about the pathophysiology of sleep apnea, including its impact on her QOL and CV health.  I recommended a sleep study, and the pt is agreeable.  Will arrange for f/u once results available.  Medications Added to Medication List This Visit: 1)  Lysine Hcl 1000 Mg Tabs (Lysine hcl) .... As needed 2)  Aspirin 325 Mg Tabs (Aspirin) .... Take 1 tablet by mouth once a day 3)  Multivitamins Tabs (Multiple vitamin) .... Take 1 tablet by mouth once a day 4)  Calcium Carbonate Powd (Calcium carbonate) .... Take 1 tablet by mouth once a day 5)  Vitamin D 1000 Unit Caps (Cholecalciferol) .... Take 1 tablet by mouth once a day  Other Orders: Consultation Level IV [id number]) Sleep Disorder Referral (Sleep Disorder)  Patient Instructions: 1)  will schedule for sleep study 2)  Will arrange for f/u once results available.

## 2010-08-04 NOTE — Consult Note (Signed)
Summary: Cerebral Aneurysm/[facility]  Cerebral [facility]   Imported By: Laural Benes 06/29/2008 11:23:50  _____________________________________________________________________  External Attachment:    Type:   Image     Comment:   External Document

## 2010-08-04 NOTE — Letter (Signed)
Summary: [facility]  [facility]iology [facility] Note   Imported By: Sallee Provencal 03/17/2009 13:54:26  _____________________________________________________________________  External Attachment:    Type:   Image     Comment:   External Document

## 2010-08-04 NOTE — Letter (Signed)
Summary: [facility]   Imported By: Laural Benes 08/25/2008 15:06:36  _____________________________________________________________________  External Attachment:    Type:   Image     Comment:   External Document

## 2010-08-04 NOTE — Assessment & Plan Note (Signed)
Summary: 3 month roa/jls   Vital Signs:  Patient profile:   65 year old female Menstrual status:  hysterectomy Height:      66.25 inches Weight:      226 pounds BMI:     36.33 Pulse rate:   56 / minute BP sitting:   110 / 70  (right arm) Cuff size:   large  Vitals Entered By: Sherron Monday, CMA (September 15, 2008 11:25 AM) CC: Follow up on blood sugar Is Patient Diabetic? No CBG Result 104 Comments Sherron Monday, CMA  September 15, 2008 11:35 AM   years   days  Menstrual Status hysterectomy   History of Present Illness: [name] comes in today for follow up visit for multiple med conditions .  See last visit as initial visit.  Siince that time she has a second opinion about her aneurysm. She was seen at [facility]   Dr Laban Emperor   5 mm  Aneurysm  felt to havea a very  low risk of  rupture  and decision  to follow now.  INtervention would be difficut with more risk.  CVA :   Eye signs are better from ministroke.  O diplopia  A F last pm . had episode  comes and goes sees Dr Lovena Le and on [place] and coumadin and metoprolol.  Coumadin on 3 weeks recheck  regulation.  goal  INR  of 2-3.     Ttremors ocassionally    right hand .  No balance issues and vision back to  baseline.    ?OSA    needs to get appt back.     family says she stops breathing and snores and has daytime smnalence .  ? fam hx undiagnosed   sleep apnea signs  Mammogram.  Due .          Hypertension History:      She complains of palpitations, but denies headache, chest pain, dyspnea with exertion, orthopnea, PND, peripheral edema, visual symptoms, neurologic problems, and syncope.  She notes no problems with any antihypertensive medication side effects.  Further comments include: Atrial Fib last night-pt has a pacemaker.        Positive major cardiovascular risk factors include female age 79 years old or older, hyperlipidemia, and hypertension.  Negative major cardiovascular risk factors include non-tobacco-user  status.        Positive history for target organ damage include ASHD (either angina/prior MI/prior CABG).      Preventive Screening-Counseling & Management     Alcohol drinks/day: 0     Smoking Status: quit     Year Quit: 1984     Caffeine use/day: 1     Does Patient Exercise: no  Current Medications (verified): 1)  Tikosyn 250 Mcg Caps (Dofetilide) .[name].. 1 Two Times A Day 2)  Warfarin Sodium 3 Mg Tabs (Warfarin Sodium) .... 3mg  Once Daily 3)  Cozaar 100 Mg Tabs (Losartan Potassium) .[name].. 1 By Mouth Once Daily 4)  Metoprolol Succinate 50 Mg Xr24h-Tab (Metoprolol Succinate) .... 2 Qam and 1 At Bedtime 5)  Simvastatin 80 Mg Tabs (Simvastatin) .[name].. 1 By Mouth Once Daily 6)  Indapamide 2.5 Mg Tabs (Indapamide) .... 1/2 Qam 7)  Fluoxetine Hcl 20 Mg Caps (Fluoxetine Hcl) .[name].. 1 Qam 8)  Klor-Con M20 20 Meq Cr-Tabs (Potassium Chloride Crys Cr) .[name].. 1 Qam 9)  Lysine Hcl 1000 Mg Tabs (Lysine Hcl) 10)  Aspirin 325 Mg  Tabs (Aspirin) 11)  Multivitamins   Tabs (Multiple Vitamin)  12)  Calcium Carbonate   Powd (Calcium Carbonate)  Allergies (verified): No Known Drug Allergies  Past History:  Family History:    Father: Died sucide- pt age 50    Mother: heart arrthymia    Siblings: Brother died of heart attack age 34, 93 health problems     (06/19/2008)  Social History:    Married  originally from [place] of 2   HS education    Former Smoker    Alcohol use-yes    Drug use-no    Regular exercise-no    hunting dogs.     (06/19/2008)  Past Medical History:    Cerebrovascular accident, hx of    Coronary artery disease  history of stents 1989, 2003     Hypertension    Depression    chronic atrial fib  tachybradycardia pacemaker 12/08          LAST    Mammogram: 2008    Pap: hyst    Td: within 10 years    Colonscopy: 3 years ago    EKG: 05/15/08    Dexa: 2 year ago  ok    Eye Exam: 3 years ago        Zostavax     Other: coumadin therapy.     Smoking: former     Consults    Dr. Trenton Gammon    Dr. Lovena Le    Dr. Acie Fredrickson    Blood transfusion    Allergic rhinitis  Past Surgical History:    Hysterectomy oopherectomy 1984  2 surgeries   both ovaries gone.    Permanent pacemaker 12 08     Back Surgies x 2-3 Dr. Trenton Gammon    G0 P0    C Stent    Cholecystectomy 1984  Past History:  Care Management:    Cardiology: Dr. Lovena Le, Dr. Jasper Riling  Social History:    Caffeine use/day:  1  Review of Systems  The patient denies anorexia, fever, weight loss, chest pain, syncope, headaches, hemoptysis, hematochezia, severe indigestion/heartburn, muscle weakness, difficulty walking, abnormal bleeding, enlarged lymph nodes, and angioedema.         fever blister and ocass mouth ulcers.   Physical Exam  General:  alert, well-developed, and well-nourished.   Head:  normocephalic and atraumatic.   Eyes:  PERRL, EOMs full, conjunctiva clear  Ears:  R ear normal and L ear normal.   Mouth:  tongue midline and no fasciculation Neck:  No deformities, masses, or tenderness noted. Lungs:  Normal respiratory effort, chest expands symmetrically. Lungs are clear to auscultation, no crackles or wheezes.no dullness.   Heart:  normal rate, regular rhythm, no murmur, and no gallop.   Abdomen:  soft, non-tender, normal bowel sounds, no hepatomegaly, and no splenomegaly.   Msk:  no joint swelling and no joint warmth.  no atrophy noted   Pulses:  present without delay Extremities:  no clubbing cyanosis or edema  Neurologic:  alert & oriented X3, gait normal, and Romberg negative.  no obv focality today Skin:  turgor normal, color normal, no ecchymoses, and no petechiae.   Cervical Nodes:  No lymphadenopathy noted Psych:  Normal eye contact, appropriate affect. Cognition appears normal.   Diabetes Management Exam:    Eye Exam:       Eye Exam done elsewhere          Date: 07/03/2006          Results: normal          Done by:  Lens Crafters   Impression & Recommendations:   Problem # 1:  SNORING (ICD-786.09)  Her updated medication list for this problem includes:    Metoprolol Succinate 50 Mg Xr24h-tab (Metoprolol succinate) .[name]... 2 qam and 1 at bedtime    Indapamide 2.5 Mg Tabs (Indapamide) .[name]... 1/2 qam  Orders: Pulmonary Referral (Pulmonary)  Problem # 2:  HYPERTENSION (ICD-401.9)  Her updated medication list for this problem includes:    Cozaar 100 Mg Tabs (Losartan potassium) .[name]... 1 by mouth once daily    Metoprolol Succinate 50 Mg Xr24h-tab (Metoprolol succinate) .[name]... 2 qam and 1 at bedtime    Indapamide 2.5 Mg Tabs (Indapamide) .[name]... 1/2 qam  Problem # 3:  CEREBRAL ANEURYSM (ICD-437.3) observation .  Problem # 4:  COUMADIN THERAPY (ICD-V58.61) currently via coumadin clinic   Problem # 5:  HYPERLIPIDEMIA (ICD-272.4)  Her updated medication list for this problem includes:    Simvastatin 80 Mg Tabs (Simvastatin) .[name]... 1 by mouth once daily  Labs Reviewed: Chol: 142 (06/22/2008)   HDL: 59.5 (06/22/2008)   LDL: 62 (06/22/2008)   TG: 105 (06/22/2008)  10 Yr Risk Heart Disease: N/A  Problem # 6:  CEREBROVASCULAR ACCIDENT, HX OF (ICD-V12.50) better  Problem # 7:  BRADYCARDIA-TACHYCARDIA SYNDROME (ICD-427.81)  Her updated medication list for this problem includes:    Tikosyn 250 Mcg Caps (Dofetilide) .[name]... 1 two times a day    Warfarin Sodium 3 Mg Tabs (Warfarin sodium) ..... 3mg  once daily    Metoprolol Succinate 50 Mg Xr24h-tab (Metoprolol succinate) .[name]... 2 qam and 1 at bedtime    Aspirin 325 Mg Tabs (Aspirin)  Problem # 8:  HYPERGLYCEMIA (ICD-790.29) lifestyle intervention  Labs Reviewed: Creat: 0.9 (06/22/2008)     Complete Medication List: 1)  Tikosyn 250 Mcg Caps (Dofetilide) .[name].. 1 two times a day 2)  Warfarin Sodium 3 Mg Tabs (Warfarin sodium) .... 3mg  once daily 3)  Cozaar 100 Mg Tabs (Losartan potassium) .[name].. 1 by mouth once daily 4)  Metoprolol Succinate 50 Mg Xr24h-tab (Metoprolol succinate) .... 2 qam and 1 at bedtime  5)  Simvastatin 80 Mg Tabs (Simvastatin) .[name].. 1 by mouth once daily 6)  Indapamide 2.5 Mg Tabs (Indapamide) .... 1/2 qam 7)  Fluoxetine Hcl 20 Mg Caps (Fluoxetine hcl) .[name].. 1 qam 8)  Klor-con M20 20 Meq Cr-tabs (Potassium chloride crys cr) .[name].. 1 qam 9)  Lysine Hcl 1000 Mg Tabs (Lysine hcl) 10)  Aspirin 325 Mg Tabs (Aspirin) 11)  Multivitamins Tabs (Multiple vitamin) 12)  Calcium Carbonate Powd (Calcium carbonate)  Hypertension Assessment/Plan:      The patient's hypertensive risk group is category C: Target organ damage and/or diabetes.  Today's blood pressure is 110/70.  Her blood pressure goal is < 140/90.  Patient Instructions: 1)  will do sleep referral 2)  get mammogram. 3)  schedule  cpx with cpx lab and  hg a1c in November  2010 4)  The medication list was reviewed and reconciled.  All changed / newly prescribed medications were explained.  A complete medication list was provided to the patient / caregiver.

## 2010-08-04 NOTE — Letter (Signed)
Summary: [facility]  [facility]   Imported By: Laural Benes 05/06/2010 11:19:13  _____________________________________________________________________  External Attachment:    Type:   Image     Comment:   External Document

## 2010-08-04 NOTE — Progress Notes (Signed)
Summary: [facility] Cardiology Note  [facility] Cardiology Note   Imported By: Jamelle Haring 12/09/2008 10:33:54  _____________________________________________________________________  External Attachment:    Type:   Image     Comment:   External Document

## 2010-08-04 NOTE — Progress Notes (Signed)
Summary: refill   Phone Note Refill Request Message from:  Patient on March 11, 2010 10:14 AM  Refills Requested: Medication #1:  METOPROLOL SUCCINATE 100 MG XR24H-TAB TAKE 1 TABLET IN THE MORNING AND 1/2 TABLET INTHE EVENING  Medication #2:  TIKOSYN 250 MCG CAPS 1 two times a day send [facility] [phone]  Initial call taken by: Delsa Sale,  March 11, 2010 10:15 AM    Prescriptions: METOPROLOL SUCCINATE 100 MG XR24H-TAB (METOPROLOL SUCCINATE) TAKE 1 TABLET IN THE MORNING AND 1/2 TABLET INTHE EVENING  #150 x 4   Entered by:   Margaretmary Bayley CMA   Authorized by:   Sophronia Simas, MD, [facility]   Signed by:   Margaretmary Bayley CMA on 03/11/2010   Method used:   Faxed to ...       [facility] (mail-order)             , [place]         Ph: [phone]       Fax: [phone]   RxID:   [id number] TIKOSYN 250 MCG CAPS (DOFETILIDE) 1 two times a day  #180 x 4   Entered by:   Margaretmary Bayley CMA   Authorized by:   Sophronia Simas, MD, [facility]   Signed by:   Margaretmary Bayley CMA on 03/11/2010   Method used:   Faxed to ...       [facility] (mail-order)             , [place]         Ph: [phone]       Fax: [phone]   RxID:   [id number]

## 2010-08-04 NOTE — Letter (Signed)
Summary: Appointment - Reminder [place], [place]  [address] [place]   [place], [place] [address]   Phone: [phone]  Fax: [phone]     Nov 24, 2008 MRN: [id number]   [facility] [address] [place], [place]  [address]   Dear Ms. Santoni,  Our records indicate that it is time to schedule a follow-up appointment.  Dr. Lovena Le recommended that you follow up with [place] in May, 2010. It is very important that we reach you to schedule this appointment.   We look forward to participating in your health care needs. Please contact us at the number listed above at your earliest convenience to schedule your appointment.  If you are unable to make an appointment at this time, give [place] a call so we can update our records.   Sincerely,  [facility] Scheduling Team

## 2010-08-04 NOTE — Procedures (Signed)
Summary: pcp  Medications Added COZAAR 100 MG TABS (LOSARTAN POTASSIUM) 1 tablet by mouth once daily SIMVASTATIN 80 MG TABS (SIMVASTATIN) Take one tablet by mouth daily at bedtime      Allergies Added: NKDA  Current Medications (verified): 1)  Tikosyn 250 Mcg Caps (Dofetilide) .[name].. 1 Two Times A Day 2)  Warfarin Sodium 3 Mg Tabs (Warfarin Sodium) .... Take 2 1/2 Mg For 6 Days and 5 Mg For 1 Day 3)  Cozaar 100 Mg Tabs (Losartan Potassium) .[name].. 1 Tablet By Mouth Once Daily 4)  Metoprolol Succinate 100 Mg Xr24h-Tab (Metoprolol Succinate) .... Take 1 Tablet in The Morning and 1/2 Tablet Inthe Evening 5)  Indapamide 2.5 Mg Tabs (Indapamide) .... 1/2 Qam 6)  Fluoxetine Hcl 20 Mg Caps (Fluoxetine Hcl) .[name].. 1 Qam 7)  Klor-Con M20 20 Meq Cr-Tabs (Potassium Chloride Crys Cr) .[name].. 1 Qam 8)  Lysine Hcl 1000 Mg Tabs (Lysine Hcl) .... As Needed 9)  Aspirin 325 Mg  Tabs (Aspirin) .... Take 1 Tablet By Mouth Once A Day 10)  Multivitamins   Tabs (Multiple Vitamin) .... Take 1 Tablet By Mouth Once A Day 11)  Calcium Carbonate   Powd (Calcium Carbonate) .... Take 1 Tablet By Mouth Once A Day 12)  Vitamin D 1000 Unit Caps (Cholecalciferol) .... Take 1 Tablet By Mouth Once A Day 13)  Cpap .... At Bedtime 14)  Simvastatin 80 Mg Tabs (Simvastatin) .... Take One Tablet By Mouth Daily At Bedtime  Allergies (verified): No Known Drug Allergies  PPM Specifications Following MD:  Cristopher Peru, MD     PPM Vendor:  [employer]     PPM Model Number:  [id number]     PPM Serial Number:  [id number] PPM DOI:  06/13/2007     PPM Implanting MD:  Cristopher Peru, MD  Lead 1    Location: RA     DOI: 06/13/2007     Model #: [id number]     Serial #: [id number]     Status: active Lead 2    Location: RV     DOI: 06/13/2007     Model #: [id number]     Serial #: [id number]     Status: active   Indications:  SSS   PPM Follow Up Remote Check?  No Battery Voltage:  2.80 V     Battery Est. Longevity:  >10 YEARS     Pacer Dependent:  No       PPM  Device Measurements [facility]  Amplitude: 2.1 mV, Impedance: 458 ohms, Threshold: 0.5 V at 0.4 msec Right Ventricle  Amplitude: 12 mV, Impedance: 400 ohms, Threshold: 1.0 V at 0.4 msec  Episodes MS Episodes:  0     Coumadin:  Yes Ventricular High Rate:  NOT AVAILABLE     Atrial Pacing:  <1%     Ventricular Pacing:  <1%  Parameters Mode:  DDD     Lower Rate Limit:  50     Upper Rate Limit:  120 Paced AV Delay:  200     Sensed AV Delay:  200 Next Cardiology Appt Due:  12/31/2009 Tech Comments:  Normal device function.  Pt maintaining SR on Tikosyn.  Coumadin followed by Dr Acie Fredrickson.  No changes made today.  Pt does TTM's with [facility].  ROV 6 months Dr Lovena Le. Chanetta Marshall RN BSN  July 12, 2009 9:58 AM  MD Comments:  Agree with above.

## 2010-08-04 NOTE — Assessment & Plan Note (Signed)
Summary: rov for osa   Copy to:  [name] Primary Provider/Referring Provider:  [name] MD  CC:  4 week follow-up on CPAP start.  Pt states she is doing well with the CPAP.   Her quality of sleep is better and she is taking no naps during the day.[name]  History of Present Illness: The pt comes in today for f/u of her osa, and recent initiation of cpap.  She has done very well with her device, and is having no issues with mask or pressure.  She is resting much better, and is no longer dozing or taking naps during the day.  She is very pleased with her progress.  Current Medications (verified): 1)  Tikosyn 250 Mcg Caps (Dofetilide) .[name].. 1 Two Times A Day 2)  Warfarin Sodium 3 Mg Tabs (Warfarin Sodium) .... Take As Directed 3)  Cozaar 100 Mg Tabs (Losartan Potassium) .[name].. 1 By Mouth Once Daily 4)  Metoprolol Succinate 50 Mg Xr24h-Tab (Metoprolol Succinate) .... 2 Qam and 1 At Bedtime 5)  Simvastatin 80 Mg Tabs (Simvastatin) .[name].. 1 By Mouth Once Daily 6)  Indapamide 2.5 Mg Tabs (Indapamide) .... 1/2 Qam 7)  Fluoxetine Hcl 20 Mg Caps (Fluoxetine Hcl) .[name].. 1 Qam 8)  Klor-Con M20 20 Meq Cr-Tabs (Potassium Chloride Crys Cr) .[name].. 1 Qam 9)  Lysine Hcl 1000 Mg Tabs (Lysine Hcl) .... As Needed 10)  Aspirin 325 Mg  Tabs (Aspirin) .... Take 1 Tablet By Mouth Once A Day 11)  Multivitamins   Tabs (Multiple Vitamin) .... Take 1 Tablet By Mouth Once A Day 12)  Calcium Carbonate   Powd (Calcium Carbonate) .... Take 1 Tablet By Mouth Once A Day 13)  Vitamin D 1000 Unit Caps (Cholecalciferol) .... Take 1 Tablet By Mouth Once A Day  Allergies (verified): No Known Drug Allergies  Review of Systems      See HPI  Vital Signs:  Patient profile:   65 year old female Menstrual status:  hysterectomy Height:      66 inches (167.64 cm) Weight:      233.38 pounds (106.08 kg) BMI:     37.80 O2 Sat:      90 % on Room air Temp:     97.6 degrees F (36.44 degrees C) oral Pulse rate:   70 / minute BP  sitting:   120 / 78  (left arm) Cuff size:   large  Vitals Entered By: Francesca Jewett CMA (December 01, 2008 9:08 AM)  O2 Flow:  Room air  Physical Exam  General:  ow female in nad Nose:  no skin breakdown or pressure necrosis from cpap mask.   Impression & Recommendations:  Problem # 1:  OBSTRUCTIVE SLEEP APNEA (ICD-327.23)  The pt is doing well with cpap, and has seen improvement in her sleep and daytime alertness.  At this point, we need to optimize her pressure for her, and she needs to work on weight loss.  Medications Added to Medication List This Visit: 1)  Warfarin Sodium 3 Mg Tabs (Warfarin sodium) .... Take as directed  Other Orders: Est. Patient Level II [id number]) DME Referral (DME)  Patient Instructions: 1)  will change machine over to auto mode for 2 weeks to optimize your pressure.  I will let you know the results. 2)  work on weight loss 3)  follow up with me in 61mos or sooner if problems.

## 2010-08-04 NOTE — Miscellaneous (Signed)
Summary: Device Preload  Clinical Lists Changes  Observations: Added new observation of PPM INDICATN: SSS (10/16/2008 10:47) Added new observation of PPMLEADSTAT2: active (10/16/2008 10:47) Added new observation of PPMLEADSER2: [id number] (10/16/2008 10:47) Added new observation of PPMLEADMOD2: 1788TC (10/16/2008 10:47) Added new observation of PPMLEADDOI2: 06/13/2007 (10/16/2008 10:47) Added new observation of PPMLEADLOC2: RV (10/16/2008 10:47) Added new observation of PPMLEADSTAT1: active (10/16/2008 10:47) Added new observation of PPMLEADSER1: [id number] (10/16/2008 10:47) Added new observation of PPMLEADMOD1: 1699TC (10/16/2008 10:47) Added new observation of PPMLEADDOI1: 06/13/2007 (10/16/2008 10:47) Added new observation of PPMLEADLOC1: RA (10/16/2008 10:47) Added new observation of PPM IMP MD: Cristopher Peru, MD (10/16/2008 10:47) Added new observation of PPM DOI: 06/13/2007 (10/16/2008 10:47) Added new observation of PPM SERL#: [id number]  (10/16/2008 10:47) Added new observation of PPM MODL#: [id number]  (10/16/2008 10:47) Added new observation of PACEMAKERMFG: [employer]  (10/16/2008 10:47) Added new observation of PACEMAKER MD: Cristopher Peru, MD  (10/16/2008 10:47)      PPM Specifications Following MD:  Cristopher Peru, MD     PPM Vendor:  [employer]     PPM Model Number:  [id number]     PPM Serial Number:  [id number] PPM DOI:  06/13/2007     PPM Implanting MD:  Cristopher Peru, MD  Lead 1    Location: RA     DOI: 06/13/2007     Model #: [id number]     Serial #: [id number]     Status: active Lead 2    Location: RV     DOI: 06/13/2007     Model #: [id number]     Serial #: [id number]     Status: active   Indications:  SSS

## 2010-08-04 NOTE — Letter (Signed)
Summary: Device-Delinquent Check  [place], [place] [address] [place]   [place], [place] [address]   Phone: [phone]  Fax: [phone]     July 13, 2010 MRN: [id number]   [facility] [address] [place], [place]  [address]   Dear Ms. Jenison,  According to our records, you have not had your implanted device checked in the recommended period of time.  We are unable to determine appropriate device function without checking your device on a regular basis.  Please call our office to schedule an appointment, with Dr Lovena Le,  as soon as possible.  If you are having your device checked by another physician, please call us so that we may update our records.  Thank you,  Gasper Sells, EMT  July 13, 2010 4:15 PM  [facility]

## 2010-08-04 NOTE — Assessment & Plan Note (Signed)
Summary: new to be est/[name] from Dr Armen Pickup   Vital Signs:  Patient Profile:   65 Years Old Female Height:     66.5 inches Weight:      221 pounds BMI:     35.26 Temp:     98.3 degrees F oral Pulse rate:   72 / minute BP sitting:   120 / 60  (right arm) Cuff size:   large  Vitals Entered By: [name], CMA (June 19, 2008 9:21 AM)                 Chief Complaint:  New Patient.  History of Present Illness: [name] is here to get establish. She comes in today with her husband for a first time visit.  Her previous primary care doctor Dr Sandi Mariscal  has changed practices.  While waiting for her first appointment however she had what was diagnosed as  a mini stroke 06/07/08. she was hospitalized at [facility] with acute onset of diplopia and right-sided headache.  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  The family got mixed messages about the cause of the eye findings.  The radiologist recommended quail treatment.  However they are going to get a second opinion andGoing to see Dr. Trenton Gammon at 11am. Residual diplopia  is somewhat better but aggravating.  She has no other symptoms..     Her past diagnosis include : 1. chronic atrial fibrillation she is on Coumadin therapy and aspirin.  2. hypertension although elevated in the hospital was apparently controlled otherwise.  3.dyslipidemia on statin medication.  She is due for lab studies.  4. history of coronary artery disease with stent placement 2007 has pacemaker followed by Dr. Crissie Sickles.  She sees him about every 6 months.  5. slightly elevated blood sugar found in the hospital with hemoglobin A1c of 6.3.  She was told to modify her diet.  6. reactive depression controlled on fluoxetine. See patient DATA sheet.     Prior Medications Reviewed Using: List Brought by Patient  Prior Medication List:  No prior  medications documented  Updated Prior Medication List: TIKOSYN 250 MCG CAPS (DOFETILIDE) 1 two times a day WARFARIN SODIUM 3 MG TABS (WARFARIN SODIUM) 3mg  once daily COZAAR 50 MG TABS (LOSARTAN POTASSIUM) 1 by mouth once daily METOPROLOL SUCCINATE 50 MG XR24H-TAB (METOPROLOL SUCCINATE) 2 qam and 1 at bedtime SIMVASTATIN 80 MG TABS (SIMVASTATIN) 1 by mouth once daily INDAPAMIDE 2.5 MG TABS (INDAPAMIDE) 1/2 qam FLUOXETINE HCL 20 MG CAPS (FLUOXETINE HCL) 1 qam KLOR-CON M20 20 MEQ CR-TABS (POTASSIUM CHLORIDE CRYS CR) 1 qam LYSINE HCL 1000 MG TABS (LYSINE HCL)  ASPIRIN 325 MG  TABS (ASPIRIN)  VITAMIN C 500 MG  TABS (ASCORBIC ACID)  MULTIVITAMINS   TABS (MULTIPLE VITAMIN)  CALCIUM CARBONATE   POWD (CALCIUM CARBONATE)  VITAMIN B COMPLEX-C   CAPS (B COMPLEX-C)  VITAMIN B-12 500 MCG  TABS (CYANOCOBALAMIN)   Current Allergies (reviewed today): No known allergies   Past Medical History:    Cerebrovascular accident, hx of    Coronary artery disease  history of stents 1989, 2003     Hypertension    Depression    chronic atrial fib  tachybradycardia pacemaker 12/08          LAST    Mammogram: 2008    Pap: hyst    Td: within 10 years  Colonscopy: 3 years ago    EKG: 05/15/08    Dexa: 2 year ago  ok    Eye Exam: 3 years ago    Other: coumadin therapy.     Smoking: former    Consults    Dr. Trenton Gammon    Dr. Lovena Le    Dr. Acie Fredrickson    Blood transfusion    Allergic rhinitis  Past Surgical History:    Hysterectomy oopherectomy 1984    Permanent pacemaker 12 08     Back Surgies x 2-3 Dr. Theodoro Kos P0    C Stent    Cholecystectomy 1984   Family History:    Father: Died sucide- pt age 14    Mother: heart arrthymia    Siblings: Brother died of heart attack age 78, 63 health problems  Social History:    Married  originally from [place] of 2   HS education    Former Smoker    Alcohol use-yes    Drug use-no    Regular exercise-no    Designer, television/film set.   Risk  Factors:  Tobacco use:  quit    Year quit:  25 years Drug use:  no Alcohol use:  yes    Type:  wine    Drinks per day:  <1    Comments:  socially Exercise:  no  Mammogram History:     Date of Last Mammogram:  07/03/2006   Colonoscopy History:     Date of Last Colonoscopy:  07/03/2005    Review of Systems  The patient denies anorexia, fever, weight loss, chest pain, syncope, dyspnea on exertion, prolonged cough, melena, hematochezia, severe indigestion/heartburn, hematuria, muscle weakness, unusual weight change, abnormal bleeding, enlarged lymph nodes, and angioedema.         rest of ros neg or [place] or see HPI   Physical Exam  General:     alert, well-developed, well-nourished, and well-hydrated.   with right eye closed .  She has obvious drifting of right eye lateral. Head:     normocephalic and atraumatic.   Eyes:     vision grossly intact and pupils equal.  right eye decreased range of motion.  Decreased medial movement. Ears:     R ear normal and L ear normal.   Nose:     no external deformity and no nasal discharge.   Mouth:     pharynx pink and moist.   Neck:     No deformities, masses, or tenderness noted. Lungs:     Normal respiratory effort, chest expands symmetrically. Lungs are clear to auscultation, no crackles or wheezes. Heart:     Normal rate and regular rhythm. S1 and S2 normal without gallop, murmur, click, rub or other extra sounds.no lifts.   Abdomen:     Bowel sounds positive,abdomen soft and non-tender without masses, organomegaly or hernias noted. Msk:     no joint swelling and no joint warmth.   Pulses:     present without delay Extremities:     no cce Neurologic:     alert & oriented X3, strength normal in all extremities, and gait normal.  neg rhomberg  Skin:     turgor normal, color normal, no ecchymoses, and no petechiae.   Cervical Nodes:     No lymphadenopathy noted Psych:     Oriented X3, normally interactive, not anxious  appearing, and not depressed appearing.   Additional Exam:     records obtained and reviewed .  Impression & Recommendations:  Problem # 1:  DIPLOPIA (ICD-368.2) related to cerebrovascular event.  plan follow up per neurology  .   Problem # 2:  CEREBROVASCULAR ACCIDENT, HX OF (ICD-V12.50)  Problem # 3:  COUMADIN THERAPY (ICD-V58.61) Assessment: Comment Only  Problem # 4:  CORONARY ARTERY DISEASE (ICD-414.00)  Her updated medication list for this problem includes:    Tikosyn 250 Mcg Caps (Dofetilide) .[name]... 1 two times a day    Cozaar 50 Mg Tabs (Losartan potassium) .[name]... 1 by mouth once daily    Metoprolol Succinate 50 Mg Xr24h-tab (Metoprolol succinate) .[name]... 2 qam and 1 at bedtime    Indapamide 2.5 Mg Tabs (Indapamide) .[name]... 1/2 qam    Aspirin 325 Mg Tabs (Aspirin)   Problem # 5:  HYPERTENSION (ICD-401.9)  Her updated medication list for this problem includes:    Cozaar 50 Mg Tabs (Losartan potassium) .[name]... 1 by mouth once daily    Metoprolol Succinate 50 Mg Xr24h-tab (Metoprolol succinate) .[name]... 2 qam and 1 at bedtime    Indapamide 2.5 Mg Tabs (Indapamide) .[name]... 1/2 qam   Problem # 6:  ATRIAL FIBRILLATION, PAROXYSMAL (ICD-427.31)  Her updated medication list for this problem includes:    Tikosyn 250 Mcg Caps (Dofetilide) .[name]... 1 two times a day    Warfarin Sodium 3 Mg Tabs (Warfarin sodium) ..... 3mg  once daily    Metoprolol Succinate 50 Mg Xr24h-tab (Metoprolol succinate) .[name]... 2 qam and 1 at bedtime    Aspirin 325 Mg Tabs (Aspirin)   Problem # 7:  HYPERGLYCEMIA (ICD-790.29) Assessment: New plan follow up   Labs Reviewed: Creat: 0.9 (06/11/2007)      Problem # 8:  CEREBRAL ANEURYSM (ICD-437.3) consult pending about optimmum mamagement   Problem # 9:  PACEMAKER, PERMANENT (ICD-V45.01) Assessment: Comment Only  Problem # 10:  DEPRESSION, HX OF (ICD-V11.8) stable   Complete Medication List: 1)  Tikosyn 250 Mcg Caps (Dofetilide) .[name].. 1 two times a day 2)   Warfarin Sodium 3 Mg Tabs (Warfarin sodium) .... 3mg  once daily 3)  Cozaar 50 Mg Tabs (Losartan potassium) .[name].. 1 by mouth once daily 4)  Metoprolol Succinate 50 Mg Xr24h-tab (Metoprolol succinate) .... 2 qam and 1 at bedtime 5)  Simvastatin 80 Mg Tabs (Simvastatin) .[name].. 1 by mouth once daily 6)  Indapamide 2.5 Mg Tabs (Indapamide) .... 1/2 qam 7)  Fluoxetine Hcl 20 Mg Caps (Fluoxetine hcl) .[name].. 1 qam 8)  Klor-con M20 20 Meq Cr-tabs (Potassium chloride crys cr) .[name].. 1 qam 9)  Lysine Hcl 1000 Mg Tabs (Lysine hcl) 10)  Aspirin 325 Mg Tabs (Aspirin) 11)  Vitamin C 500 Mg Tabs (Ascorbic acid) 12)  Multivitamins Tabs (Multiple vitamin) 13)  Calcium Carbonate Powd (Calcium carbonate) 14)  Vitamin B Complex-c Caps (B complex-c) 15)  Vitamin B-12 500 Mcg Tabs (Cyanocobalamin)   Patient Instructions: 1)  schedule for fasting labs here LIPIDs , HScrp, Homocysteine, Vit D level 2)  Dx  CVA, CAD  3)  then return office visit . 4)  After coumadin stabilized we can start checking it here. 5)  Change to regular Multivit and equi valent of 1000 iiu Vit d per day.    ]

## 2010-08-04 NOTE — Consult Note (Signed)
Summary: [facility]   Imported By: Laural Benes 07/14/2008 14:44:59  _____________________________________________________________________  External Attachment:    Type:   Image     Comment:   External Document

## 2010-08-04 NOTE — Assessment & Plan Note (Signed)
Summary: rov for osa   Primary Provider/Referring Provider:  [name] MD  CC:  Pt is here for a 6 month f/u appt.  Pt states she is wearing her cpap machine every night. Approx 6 hours per night.  Pt c/o pressure too high.  Pt denied any complaints with mask.  [name]  History of Present Illness: the pt comes in today for f/u of her osa.  She has been wearing cpap compliantly, and feels that she is sleeping better with improved daytime alertness.  Her only compliant is increased mask leaks, and she thinks this could be due to the pressure level.  However, her mask is worn and due for replacement.  I have explained that may be the cause of the increased leaks.   Current Medications (verified): 1)  Tikosyn 250 Mcg Caps (Dofetilide) .[name].. 1 Two Times A Day 2)  Warfarin Sodium 3 Mg Tabs (Warfarin Sodium) .... Take 2 1/2 Mg For 6 Days and 5 Mg For 1 Day 3)  Cozaar 100 Mg Tabs (Losartan Potassium) .[name].. 1 1/2 Tablets By Mouth Once Daily 4)  Metoprolol Succinate 50 Mg Xr24h-Tab (Metoprolol Succinate) .... 2 Qam and At Bedtime 5)  Simvastatin 80 Mg Tabs (Simvastatin) .[name].. 1 By Mouth Once Daily 6)  Indapamide 2.5 Mg Tabs (Indapamide) .... 1/2 Qam 7)  Fluoxetine Hcl 20 Mg Caps (Fluoxetine Hcl) .[name].. 1 Qam 8)  Klor-Con M20 20 Meq Cr-Tabs (Potassium Chloride Crys Cr) .[name].. 1 Qam 9)  Lysine Hcl 1000 Mg Tabs (Lysine Hcl) .... As Needed 10)  Aspirin 325 Mg  Tabs (Aspirin) .... Take 1 Tablet By Mouth Once A Day 11)  Multivitamins   Tabs (Multiple Vitamin) .... Take 1 Tablet By Mouth Once A Day 12)  Calcium Carbonate   Powd (Calcium Carbonate) .... Take 1 Tablet By Mouth Once A Day 13)  Vitamin D 1000 Unit Caps (Cholecalciferol) .... Take 1 Tablet By Mouth Once A Day 14)  Cpap .... At Bedtime  Allergies (verified): No Known Drug Allergies  Review of Systems      See HPI  Vital Signs:  Patient profile:   65 year old female Menstrual status:  hysterectomy Height:      66 inches Weight:      231.50  pounds O2 Sat:      98 % on Room air Temp:     98.1 degrees F oral Pulse rate:   65 / minute BP sitting:   124 / 76  (left arm) Cuff size:   large  Vitals Entered By: Matthew Folks LPN (November 29, 624THL 9:09 AM)  O2 Flow:  Room air CC: Pt is here for a 6 month f/u appt.  Pt states she is wearing her cpap machine every night. Approx 6 hours per night.  Pt c/o pressure too high.  Pt denied any complaints with mask.   Comments Medications reviewed with patient Matthew Folks LPN  November 29, 624THL 9:09 AM    Physical Exam  General:  ow female in nad Nose:  no skin breakdown or pressure necrosis from cpap mask Neurologic:  alert, not sleepy, moves all 4.   Impression & Recommendations:  Problem # 1:  OBSTRUCTIVE SLEEP APNEA (ICD-327.23)  the pt is having issues with mask leaks that I suspect is due to the age of her mask, and possibly how it is setting on her face.  She is due for a new mask, and we will see how things are once she gets this.  She will let us know if problem continues.  She feels she is sleeping much better and more rested since being on cpap.  I have encouraged her to work on weight loss.  Time spent with pt today was 18min  Medications Added to Medication List This Visit: 1)  Warfarin Sodium 3 Mg Tabs (Warfarin sodium) .... Take 2 1/2 mg for 6 days and 5 mg for 1 day  Other Orders: Est. Patient Level III [id number])  Patient Instructions: 1)  work on weight loss 2)  please call if mask issues are not resolving 3)  followup with me in 86mos   Immunization History:  Influenza Immunization History:    Influenza:  historical (07/04/2007)  Pneumovax Immunization History:    Pneumovax:  historical (07/04/2007)

## 2010-08-04 NOTE — Assessment & Plan Note (Signed)
Summary: ov to discuss sleep study results/mg   Copy to:  [name] Primary Provider/Referring Provider:  [name] MD  CC:  Pt is here for a f/u appt to discuss sleep study results.  .  History of Present Illness: The pt comes in today for f/u of her recent sleep study.  She was found to have an AHI of 21/hr, and desat to 78%.  Her events were worse in the supine position, and there was loud snoring noted.  I have gone over the results with her in detail, and answered all of her questions.  Medications Prior to Update: 1)  Tikosyn 250 Mcg Caps (Dofetilide) .[name].. 1 Two Times A Day 2)  Warfarin Sodium 3 Mg Tabs (Warfarin Sodium) .... 3mg  Once Daily 3)  Cozaar 100 Mg Tabs (Losartan Potassium) .[name].. 1 By Mouth Once Daily 4)  Metoprolol Succinate 50 Mg Xr24h-Tab (Metoprolol Succinate) .... 2 Qam and 1 At Bedtime 5)  Simvastatin 80 Mg Tabs (Simvastatin) .[name].. 1 By Mouth Once Daily 6)  Indapamide 2.5 Mg Tabs (Indapamide) .... 1/2 Qam 7)  Fluoxetine Hcl 20 Mg Caps (Fluoxetine Hcl) .[name].. 1 Qam 8)  Klor-Con M20 20 Meq Cr-Tabs (Potassium Chloride Crys Cr) .[name].. 1 Qam 9)  Lysine Hcl 1000 Mg Tabs (Lysine Hcl) .... As Needed 10)  Aspirin 325 Mg  Tabs (Aspirin) .... Take 1 Tablet By Mouth Once A Day 11)  Multivitamins   Tabs (Multiple Vitamin) .... Take 1 Tablet By Mouth Once A Day 12)  Calcium Carbonate   Powd (Calcium Carbonate) .... Take 1 Tablet By Mouth Once A Day 13)  Vitamin D 1000 Unit Caps (Cholecalciferol) .... Take 1 Tablet By Mouth Once A Day  Allergies (verified): No Known Drug Allergies  Vital Signs:  Patient profile:   65 year old female Menstrual status:  hysterectomy Height:      66 inches Weight:      231.13 pounds O2 Sat:      94 % Temp:     98.3 degrees F oral Pulse rate:   71 / minute BP sitting:   130 / 80  (left arm) Cuff size:   large  Vitals Entered By: Matthew Folks LPN (April 29, 624THL 579FGE AM)  O2 Sat on room air at rest %:  94 CC: Pt is here for a f/u  appt to discuss sleep study results.   Comments Medications reviewed with patient Matthew Folks LPN  April 29, 624THL 579FGE AM    Physical Exam  General:  ow female in nad   Impression & Recommendations:  Problem # 1:  OBSTRUCTIVE SLEEP APNEA (ICD-327.23) The pt has moderate osa by her recent sleep study.  I have discussed the various treatment options with her, including weight loss, surgery, dental appliance, and cpap.  I really think cpap with weight loss offers her the best treatment option.  She has agreed to try.  Other Orders: Est. Patient Level III [id number]) DME Referral (DME)  Patient Instructions: 1)  will start cpap.  Let me know if issues with tolerance 2)  work on weight loss 3)  follow up with me in 4 weeks.

## 2010-08-04 NOTE — Cardiovascular Report (Signed)
Summary: TTM  TTM   Imported By: Sallee Provencal 06/29/2009 12:17:34  _____________________________________________________________________  External Attachment:    Type:   Image     Comment:   External Document

## 2010-08-04 NOTE — Letter (Signed)
Summary: [facility] Progress Note   [facility] Progress Note   Imported By: Sallee Provencal 06/30/2010 09:54:49  _____________________________________________________________________  External Attachment:    Type:   Image     Comment:   External Document

## 2010-08-04 NOTE — Letter (Signed)
Summary: [facility]   Imported By: Laural Benes 03/23/2010 12:59:11  _____________________________________________________________________  External Attachment:    Type:   Image     Comment:   External Document

## 2010-08-04 NOTE — Letter (Signed)
Summary: Device-Delinquent Check  [place], [place] [address] [place]   [place], [place] [address]   Phone: [phone]  Fax: [phone]     March 21, 2010 MRN: [id number]   [facility] [address] [place], [place]  [address]   Dear Ms. Shellhammer,  According to our records, you have not had your implanted device checked in the recommended period of time.  We are unable to determine appropriate device function without checking your device on a regular basis.  Please call our office to schedule an appointment, with Dr. Lovena Le, as soon as possible.  If you are having your device checked by another physician, please call us so that we may update our records.  Thank you,  Alma Friendly, LPN  September 19, 624THL 11:04 AM  [facility]

## 2010-08-04 NOTE — Letter (Signed)
Summary: [facility]   Imported By: Laural Benes 08/14/2008 15:50:11  _____________________________________________________________________  External Attachment:    Type:   Image     Comment:   External Document

## 2010-08-04 NOTE — Assessment & Plan Note (Signed)
Summary: CPX/PAP/NJR   Vital Signs:  Patient profile:   65 year old female Menstrual status:  hysterectomy Height:      66 inches Weight:      227 pounds Pulse rate:   60 / minute BP sitting:   120 / 80  (left arm) Cuff size:   large  Vitals Entered By: Sherron Monday, CMA (AAMA) (June 02, 2009 1:15 PM)        CC: CPX no pap- Pt has had a hyst.  CBG Result 114   History of Present Illness: [name] comesin for PV. She is followed for multiple medical problems that are currently stable.  since last visit   OSA dx  and under treatment . She does have more energy.  CV Has seen Dr Lovena Le and dr Acie Fredrickson and on reg follow up .   Has been on coumading and had fecnt bleeing  BV in left calf  off med for a short while   bruising in distal leg.  Less pain now.    Preventive Care Screening  Mammogram:    Date:  04/12/2009    Results:  normal   Colonoscopy:    Date:  07/03/2005    Results:  normal    Preventive Screening-Counseling & Management  Alcohol-Tobacco     Alcohol drinks/day: <1     Alcohol type: wine     Smoking Status: quit     Packs/Day: 1.0     Year Started: 1979     Year Quit: 1984  Caffeine-Diet-Exercise     Caffeine use/day: 1     Does Patient Exercise: no  Hep-HIV-STD-Contraception     Dental Visit-last 6 months no     Sun Exposure-Excessive: no  Safety-Violence-Falls     Seat Belt Use: yes     Firearms in the Home: firearms in the home     Firearm Counseling: to practice firearm safety     Smoke Detectors: yes      Blood Transfusions:  yes.    Current Medications (verified): 1)  Tikosyn 250 Mcg Caps (Dofetilide) .[name].. 1 Two Times A Day 2)  Warfarin Sodium 3 Mg Tabs (Warfarin Sodium) .... Take 2 1/2 Mg For 6 Days and 5 Mg For 1 Day 3)  Cozaar 100 Mg Tabs (Losartan Potassium) .[name].. 1 1/2 Tablets By Mouth Once Daily 4)  Metoprolol Succinate 50 Mg Xr24h-Tab (Metoprolol Succinate) .... 2 Qam and At Bedtime 5)  Simvastatin 80 Mg Tabs  (Simvastatin) .[name].. 1 By Mouth Once Daily 6)  Indapamide 2.5 Mg Tabs (Indapamide) .... 1/2 Qam 7)  Fluoxetine Hcl 20 Mg Caps (Fluoxetine Hcl) .[name].. 1 Qam 8)  Klor-Con M20 20 Meq Cr-Tabs (Potassium Chloride Crys Cr) .[name].. 1 Qam 9)  Lysine Hcl 1000 Mg Tabs (Lysine Hcl) .... As Needed 10)  Aspirin 325 Mg  Tabs (Aspirin) .... Take 1 Tablet By Mouth Once A Day 11)  Multivitamins   Tabs (Multiple Vitamin) .... Take 1 Tablet By Mouth Once A Day 12)  Calcium Carbonate   Powd (Calcium Carbonate) .... Take 1 Tablet By Mouth Once A Day 13)  Vitamin D 1000 Unit Caps (Cholecalciferol) .... Take 1 Tablet By Mouth Once A Day 14)  Cpap .... At Bedtime  Allergies (verified): No Known Drug Allergies  Past History:  Past medical, surgical, family and social histories (including risk factors) reviewed, and no changes noted (except as noted below).  Past Medical History: Cerebrovascular accident, hx of Coronary artery disease  history  of stents 1989, 2003  Hypertension Depression chronic atrial fib  tachybradycardia pacemaker 12/08       LAST Mammogram: 2008 Pap: hyst Td: more than 5 years  Colonscopy: 3 years ago EKG: 05/15/08 Dexa: 3year ago  ok Eye Exam: 3 years ago  Zostavax  Other: coumadin therapy.  Smoking: former Consults Dr. Trenton Gammon Dr. Lovena Le Dr. Acie Fredrickson Blood transfusion Allergic rhinitis  Past Surgical History: Hysterectomy bbilateral oopherectomy 1984  2 surgeries   endometriosis Permanent pacemaker 12 08  Back Surgeries x 2-3 Dr. Theodoro Kos P0 C Stent Cholecystectomy 1984  Past History:  Care Management: Cardiology: Dr. Lovena Le, Dr. Jasper Riling Pulmonary: [facility]  Family History: Reviewed history from 10/12/2008 and no changes required. Father: Died sucide- pt age 67 Mother: heart arrthymia Siblings: Brother died of heart attack age 9, 73 health problems paternal aunt: heart disease cancer: maternal grandfather (prostate)   Social History: Reviewed history from  10/12/2008 and no changes required. Married  originally from [place] of 2   HS education Former Smoker.  started 1979.  1ppd.  quit 1984 Alcohol use-yes Drug use-no Regular exercise-no hunting dogs. pt does not have any children. pt is retired.  previously worked at [employer] w/in 6 mos.:  no Sun Exposure-Excessive:  no Therapist, art Use:  yes  Review of Systems  The patient denies anorexia, fever, weight loss, weight gain, vision loss, decreased hearing, hoarseness, chest pain, prolonged cough, abdominal pain, melena, hematochezia, severe indigestion/heartburn, hematuria, transient blindness, difficulty walking, depression, unusual weight change, enlarged lymph nodes, angioedema, and breast masses.         has foot tendinitis  urinary frequency Physical Exam General Appearance: well developed, well nourished, no acute distress Eyes: conjunctiva and lids normal, PERRLA, EOMI,  WNL Ears, Nose, Mouth, Throat: TM clear, nares clear, oral exam WNL Neck: supple, no lymphadenopathy, no thyromegaly, no JVD Respiratory: clear to auscultation and percussion, respiratory effort normal Cardiovascular: normal  rate S1-S2, no murmur, rub or gallop, no bruits, peripheral pulses normal and symmetric, no cyanosis, clubbing, left leg  healing  bruise  dependent left ankle  mild calf tendernss 1 + edema   Chest: no scars, masses, tenderness; no asymmetry, skin changes, nipple discharge   Gastrointestinal: soft, non-tender; no hepatosplenomegaly, masses; active bowel sounds all quadrants,  Lymphatic: no cervical, axillary or inguinal adenopathy Musculoskeletal: gait normal, muscle tone and strength WNL, no  acute joint swelling, effusions, discoloration, crepitus  Skin: clear, good turgor, color WNL, no rashes, lesions, or ulcerations Neurologic: normal mental status, normal reflexes, normal strength, sensation, and motion Psychiatric: alert; oriented to  person, place and time Other Exam:  see labs     Impression & Recommendations:  Problem # 1:  HEALTH MAINTENANCE EXAM, ADULT (ICD-V70.0) Discussed nutrition,exercise,diet,healthy weight, vitamin D and calcium.   Problem # 2:  OBSTRUCTIVE SLEEP APNEA (ICD-327.23) under treatment  Problem # 3:  HYPERLIPIDEMIA (ICD-272.4)  Her updated medication list for this problem includes:    Simvastatin 80 Mg Tabs (Simvastatin) .[name]... 1 by mouth once daily  Labs Reviewed: SGOT: 22 (05/31/2009)   SGPT: 33 (05/31/2009)  Prior 10 Yr Risk Heart Disease: N/A (09/15/2008)   HDL:51.90 (05/31/2009), 59.5 (06/22/2008)  LDL:60 (05/31/2009), 62 (06/22/2008)  Chol:138 (05/31/2009), 142 (06/22/2008)  Trig:130.0 (05/31/2009), 105 (06/22/2008)  Orders: Prescription Created Electronically [phone])  Problem # 4:  HYPERGLYCEMIA (ICD-790.29)  Orders: Fingerstick [id number]) Glucose, (CBG) [id number])  Labs Reviewed: Creat: 0.8 (05/31/2009)     Problem # 5:  HYPERTENSION (ICD-401.9)  Her updated medication list for this problem includes:    Cozaar 100 Mg Tabs (Losartan potassium) .[name]... 1 1/2 tablets by mouth once daily    Metoprolol Succinate 50 Mg Xr24h-tab (Metoprolol succinate) .[name]... 2 qam and at bedtime    Indapamide 2.5 Mg Tabs (Indapamide) .[name]... 1/2 qam  Orders: Prescription Created Electronically [phone])  Problem # 6:  CORONARY ARTERY DISEASE (ICD-414.00)  Her updated medication list for this problem includes:    Tikosyn 250 Mcg Caps (Dofetilide) .[name]... 1 two times a day    Cozaar 100 Mg Tabs (Losartan potassium) .[name]... 1 1/2 tablets by mouth once daily    Metoprolol Succinate 50 Mg Xr24h-tab (Metoprolol succinate) .[name]... 2 qam and at bedtime    Indapamide 2.5 Mg Tabs (Indapamide) .[name]... 1/2 qam    Aspirin 325 Mg Tabs (Aspirin) .[name]... Take 1 tablet by mouth once a day  Problem # 7:  PACEMAKER, PERMANENT (ICD-V45.01) Assessment: Comment Only  Problem # 8:  OBESITY (ICD-278.00) Assessment: Comment Only   Problem # 9:  DEPRESSION, HX OF (ICD-V11.8) Assessment: Improved  Complete Medication List: 1)  Tikosyn 250 Mcg Caps (Dofetilide) .[name].. 1 two times a day 2)  Warfarin Sodium 3 Mg Tabs (Warfarin sodium) .... Take 2 1/2 mg for 6 days and 5 mg for 1 day 3)  Cozaar 100 Mg Tabs (Losartan potassium) .[name].. 1 1/2 tablets by mouth once daily 4)  Metoprolol Succinate 50 Mg Xr24h-tab (Metoprolol succinate) .... 2 qam and at bedtime 5)  Simvastatin 80 Mg Tabs (Simvastatin) .[name].. 1 by mouth once daily 6)  Indapamide 2.5 Mg Tabs (Indapamide) .... 1/2 qam 7)  Fluoxetine Hcl 20 Mg Caps (Fluoxetine hcl) .[name].. 1 qam 8)  Klor-con M20 20 Meq Cr-tabs (Potassium chloride crys cr) .[name].. 1 qam 9)  Lysine Hcl 1000 Mg Tabs (Lysine hcl) .... As needed 10)  Aspirin 325 Mg Tabs (Aspirin) .... Take 1 tablet by mouth once a day 11)  Multivitamins Tabs (Multiple vitamin) .... Take 1 tablet by mouth once a day 12)  Calcium Carbonate Powd (Calcium carbonate) .... Take 1 tablet by mouth once a day 13)  Vitamin D 1000 Unit Caps (Cholecalciferol) .... Take 1 tablet by mouth once a day 14)  Cpap  .... At bedtime  Other Orders: Admin 1st Vaccine [id number]) Flu Vaccine 54yrs + [id number]) Tdap => 70yrs IM [id number]) Admin of Any Addtl Vaccine [id number])  Patient Instructions: 1)  continue to lose weight. 2)  good shoes . 3)  Avoid simple sugars and limit sweets. 4)  return office visit in 6- 12 months .  5)  No change in meds at this time Prescriptions: KLOR-CON M20 20 MEQ CR-TABS (POTASSIUM CHLORIDE CRYS CR) 1 qam  #30 x 5   Entered by:   Sherron Monday, CMA (AAMA)   Authorized by:   [name] MD   Signed by:   Sherron Monday, CMA (AAMA) on 06/02/2009   Method used:   Electronically to        [facility] (retail)       [place], [place]  [address]       Ph: [phone] or [phone]       Fax: [phone]   RxID:   [id number] FLUOXETINE HCL 20 MG CAPS (FLUOXETINE HCL) 1 qam  #30 x 5    Entered by:   Sherron Monday, CMA (AAMA)   Authorized by:   [name] Brendolyn Stockley  MD   Signed by:   Sherron Monday, CMA (AAMA) on 06/02/2009   Method used:   Electronically to        [facility] (retail)       [place], [place]  [address]       Ph: [phone] or [phone]       Fax: [phone]   RxID:   [id number] INDAPAMIDE 2.5 MG TABS (INDAPAMIDE) 1/2 qam  #15 x 5   Entered by:   Sherron Monday, CMA (AAMA)   Authorized by:   [name] MD   Signed by:   Sherron Monday, CMA (AAMA) on 06/02/2009   Method used:   Electronically to        [facility] (retail)       [address]       [place], [place]  [address]       Ph: [phone] or [phone]       Fax: [phone]   RxID:   [id number] SIMVASTATIN 80 MG TABS (SIMVASTATIN) 1 by mouth once daily  #30 x 5   Entered by:   Sherron Monday, CMA ([place])   Authorized by:   [name] MD   Signed by:   Sherron Monday, CMA (AAMA) on 06/02/2009   Method used:   Electronically to        [facility] [phone] (retail)       [address]       [place]  [address]       Ph: [phone] or [phone]       Fax: [phone]   RxID:   [id number]    Immunization History:  Pneumovax Immunization History:    Pneumovax:  historical (07/03/2005)  Zostavax History:    Zostavax # 1:  zostavax (07/03/2006)  Immunizations Administered:  Tetanus Vaccine:    Vaccine Type: Tdap    Site: right deltoid    Mfr: GlaxoSmithKline    Dose: 0.5 ml    Route: IM    Given by: Sherron Monday, CMA (AAMA)    Exp. Date: 01/16/2011    Lot #: [id number]  Flu Vaccine Consent Questions     Do you have a history of severe allergic reactions to this vaccine? no    Any prior history of allergic reactions to egg and/or gelatin? no    Do you have a sensitivity to the preservative Thimersol? no    Do you have a past history of Guillan-Barre Syndrome? no    Do you  currently have an acute febrile illness? no    Have you ever had a severe reaction to latex? no    Vaccine information given and explained to patient? yes    Are you currently pregnant? no    Lot Number:[id number]   Exp Date:12/30/2009   Site Given  Left Deltoid IMflu Sherron Monday, CMA (AAMA)  June 02, 2009 1:27 PM

## 2010-08-04 NOTE — Progress Notes (Signed)
Summary: REFILL MEDS   Phone Note Refill Request Call back at Home Phone [phone] Message from:  Patient on Nov 30, 2009 11:14 AM  Refills Requested: Medication #1:  TIKOSYN 250 MCG CAPS 1 two times a day [facility] [address] [id number]   Method Requested: Fax to [facility] Initial call taken by: Neil Crouch,  Nov 30, 2009 11:14 AM  Follow-up for Phone Call        Pt is out of medication need it today Delsa Sale  December 01, 2009 8:24 AM  Additional Follow-up for Phone Call Additional follow up Details #1::        Script sent to pharmacy spoke with pharmacy to confirm. Additional Follow-up by: Margaretmary Bayley CMA,  December 01, 2009 2:22 PM

## 2010-08-04 NOTE — Letter (Signed)
Summary: Lipid Letter  [facility] [place]   [place], [place] [address]   Phone: [phone]  Fax: [phone]    06/24/2008  [facility] [place], [place]  [address]  Dear Ms. Odenthal:  We have carefully reviewed your last lipid profile from 06/22/2008 and the results are noted below with a summary of recommendations for lipid management.    Cholesterol:       142     Goal: < 200   HDL "good" Cholesterol:   59.5     Goal: > 40   LDL "bad" Cholesterol:   62     Goal: < 100   Triglycerides:       105     Goal: < 150    Your labs are okay except your blood sugar is somewhat elevated. Please avoid sweets and simple sugars as discussed with Dr. [name] and schedule an office visit in 1 month.    TLC Diet (Therapeutic Lifestyle Change): Saturated Fats & Transfatty acids should be kept < 7% of total calories ***Reduce Saturated Fats Polyunstaurated Fat can be up to 10% of total calories Monounsaturated Fat Fat can be up to 20% of total calories Total Fat should be no greater than 25-35% of total calories Carbohydrates should be 50-60% of total calories Protein should be approximately 15% of total calories Fiber should be at least 20-30 grams a day ***Increased fiber may help lower LDL Total Cholesterol should be < 200mg /day Consider adding plant stanol/sterols to diet (example: Benacol spread) ***A higher intake of unsaturated fat may reduce Triglycerides and Increase HDL    Adjunctive Measures (may lower LIPIDS and reduce risk of Heart Attack) include: Aerobic Exercise (20-30 minutes 3-4 times a week) Limit Alcohol Consumption Weight Reduction Aspirin 75-81 mg a day by mouth (if not allergic or contraindicated) Vitamin E 400 IU a day by mouth Folic Acid 1mg  a day by mouth Dietary Fiber 20-30 grams a day by mouth     Current Medications: 1)    Tikosyn 250 Mcg Caps (Dofetilide) .[name].. 1 two times a day 2)    Warfarin Sodium 3 Mg Tabs (Warfarin  sodium) .... 3mg  once daily 3)    Cozaar 50 Mg Tabs (Losartan potassium) .[name].. 1 by mouth once daily 4)    Metoprolol Succinate 50 Mg Xr24h-tab (Metoprolol succinate) .... 2 qam and 1 at bedtime 5)    Simvastatin 80 Mg Tabs (Simvastatin) .[name].. 1 by mouth once daily 6)    Indapamide 2.5 Mg Tabs (Indapamide) .... 1/2 qam 7)    Fluoxetine Hcl 20 Mg Caps (Fluoxetine hcl) .[name].. 1 qam 8)    Klor-con M20 20 Meq Cr-tabs (Potassium chloride crys cr) .[name].. 1 qam 9)    Lysine Hcl 1000 Mg Tabs (Lysine hcl) 10)    Aspirin 325 Mg  Tabs (Aspirin) 11)    Vitamin C 500 Mg  Tabs (Ascorbic acid) 12)    Multivitamins   Tabs (Multiple vitamin) 13)    Calcium Carbonate   Powd (Calcium carbonate) 14)    Vitamin B Complex-c   Caps (B complex-c) 15)    Vitamin B-12 500 Mcg  Tabs (Cyanocobalamin)  If you have any questions, please call. We appreciate being able to work with you.   Sincerely,    [place]

## 2010-08-04 NOTE — Progress Notes (Signed)
----   Converted from flag ---- ---- 10/27/2008 5:11 PM, Matthew Folks LPN wrote: pt needs ov with kc to discuss sleep study results. ------------------------------ called and spoke with pt.  pt scheduled with kc for thurs april 29 at 11:45am.

## 2010-08-04 NOTE — Miscellaneous (Signed)
Summary: optimal pressure is 12 cm   Clinical Lists Changes  Orders: Added new Referral order of DME Referral (DME) - Signed auto shows good compliance, optimal pressure 12cm.

## 2010-08-04 NOTE — Letter (Signed)
Summary: External Correspondence  External Correspondence   Imported By: Adin Hector 10/04/2009 10:58:55  _____________________________________________________________________  External Attachment:    Type:   Image     Comment:   External Document

## 2010-08-04 NOTE — Cardiovascular Report (Signed)
Summary: TTM   TTM   Imported By: Sallee Provencal 05/03/2010 15:19:08  _____________________________________________________________________  External Attachment:    Type:   Image     Comment:   External Document

## 2010-08-04 NOTE — Letter (Signed)
Summary: [facility]   Imported By: Laural Benes 02/24/2009 10:58:03  _____________________________________________________________________  External Attachment:    Type:   Image     Comment:   External Document

## 2010-08-04 NOTE — Letter (Signed)
Summary: [facility] [place], [place]  [id number] [place]   [place], [place] [address]   Phone: [phone]  Fax: [phone]     February 17, 2009 MRN: [id number]   [facility] [address] [place], [place]  [address]   Dear [name],  This letter is being sent to you as a reminder that it is necessary for you to get your INR/PT checked regularly so that we can optimize your care.  Our records indicate that you were scheduled to have a test done recently.  As of today, we have not received the results of this test.  It is very important that you have your INR checked.  Please call our office at the number listed above to schedule an appointment at your earliest convenience.    If you have recently had your protime checked or have discontinued this medication, please contact our office at the above phone number to clarify this issue.  Thank you for this prompt attention to this important health care matter.  Sincerely,   [facility]

## 2010-08-04 NOTE — Cardiovascular Report (Signed)
Summary: Office Visit  Office Visit   Imported By: Sallee Provencal 02/12/2009 13:45:53  _____________________________________________________________________  External Attachment:    Type:   Image     Comment:   External Document

## 2010-08-04 NOTE — Cardiovascular Report (Signed)
Summary: TTM   TTM   Imported By: Sallee Provencal 01/21/2010 09:18:03  _____________________________________________________________________  External Attachment:    Type:   Image     Comment:   External Document

## 2010-08-04 NOTE — Assessment & Plan Note (Signed)
Summary: 2 MONTH FUP//CCM   Vital Signs:  Patient profile:   65 year old female Menstrual status:  hysterectomy Weight:      234 pounds Pulse rate:   60 / minute BP sitting:   120 / 80  (left arm) Cuff size:   large  Vitals Entered By: Sherron Monday, CMA (AAMA) (March 01, 2010 10:30 AM) CC: Follow-up visit on labs, Hypertension Management   History of Present Illness: [name] comes in today  for follow up of  labs and lipid meds.   Since changing to lipitor thinks she has more energy and feels some better.   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    CV stable   limits greens and salads. on coumadin no ne cp sob. Mood stable  no change .   Hypertension History:      She complains of peripheral edema, but denies headache, chest pain, palpitations, dyspnea with exertion, orthopnea, PND, visual symptoms, neurologic problems, syncope, and side effects from treatment.  She notes no problems with any antihypertensive medication side effects.        Positive major cardiovascular risk factors include female age 40 years old or older, hyperlipidemia, and hypertension.  Negative major cardiovascular risk factors include non-tobacco-user status.        Positive history for target organ damage include ASHD (either angina/prior MI/prior CABG).     Preventive Screening-Counseling & Management  Alcohol-Tobacco     Alcohol drinks/day: <1     Alcohol type: wine     Smoking Status: quit     Packs/Day: 1.0     Year Started: 1979     Year Quit: 1984  Caffeine-Diet-Exercise     Caffeine use/day: 1     Does Patient Exercise: no  Current Medications (verified): 1)  Tikosyn 250 Mcg Caps (Dofetilide) .[name].. 1 Two Times A Day 2)  Warfarin Sodium 5 Mg Tabs (Warfarin Sodium) .... As Directed 3)  Cozaar 100 Mg Tabs (Losartan Potassium) .[name].. 1 Tablet By Mouth Once Daily 4)  Metoprolol  Succinate 100 Mg Xr24h-Tab (Metoprolol Succinate) .... Take 1 Tablet in The Morning and 1/2 Tablet Inthe Evening 5)  Indapamide 2.5 Mg Tabs (Indapamide) .... 1/2 Qam 6)  Fluoxetine Hcl 20 Mg Caps (Fluoxetine Hcl) .[name].. 1 Qam 7)  Klor-Con M20 20 Meq Cr-Tabs (Potassium Chloride Crys Cr) .[name].. 1 Qam 8)  Lysine Hcl 1000 Mg Tabs (Lysine Hcl) .... As Needed 9)  Aspirin 325 Mg  Tabs (Aspirin) .... Take 1 Tablet By Mouth Once A Day 10)  Multivitamins   Tabs (Multiple Vitamin) .... Take 1 Tablet By Mouth Once A Day 11)  Calcium Carbonate   Powd (Calcium Carbonate) .... Take 1 Tablet By Mouth Once A Day 12)  Vitamin D 1000 Unit Caps (Cholecalciferol) .... Take 1 Tablet By Mouth Once A Day 13)  Cpap .... At Bedtime 14)  Valacyclovir Hcl 1 Gm Tabs (Valacyclovir Hcl) .... Take 2 By Mouth and Repeat in 12 Hours For Cold Sores 15)  Ketoconazole 2 % Crea (Ketoconazole) .... Apply To Rash Two Times A Day For Up To 2 Weeks 16)  Lipitor 40 Mg Tabs (Atorvastatin Calcium) .[name].. 1 By Mouth Once Daily  Allergies (verified): No Known Drug Allergies  Past History:  Past medical, surgical, family and social histories (including risk factors) reviewed, and no changes noted (except as  noted below).  Past Medical History: Reviewed history from 06/02/2009 and no changes required. Cerebrovascular accident, hx of Coronary artery disease  history of stents 1989, 2003  Hypertension Depression chronic atrial fib  tachybradycardia pacemaker 12/08       LAST Mammogram: 2008 Pap: hyst Td: more than 5 years  Colonscopy: 3 years ago EKG: 05/15/08 Dexa: 3year ago  ok Eye Exam: 3 years ago  Zostavax  Other: coumadin therapy.  Smoking: former Consults Dr. Trenton Gammon Dr. Lovena Le Dr. Acie Fredrickson Blood transfusion Allergic rhinitis  Past Surgical History: Reviewed history from 06/02/2009 and no changes required. Hysterectomy bbilateral oopherectomy 1984  2 surgeries   endometriosis Permanent pacemaker 12 08  Back Surgeries x  2-3 Dr. Trenton Gammon G0 P0 C Stent Cholecystectomy 1984  Past History:  Care Management: Cardiology: Dr. Lovena Le, Dr. Jasper Riling Pulmonary: [facility]  Family History: Reviewed history from 10/12/2008 and no changes required. Father: Died sucide- pt age 43 Mother: heart arrthymia Siblings: Brother died of heart attack age 38, 64 health problems paternal aunt: heart disease cancer: maternal grandfather (prostate)   Social History: Reviewed history from 12/30/2009 and no changes required. Married  originally from [place] of 2   HS education Former Smoker.  started 1979.  1ppd.  quit 1984 Alcohol use-yes Drug use-no Regular exercise-no hunting dogs. pt does not have any children. pt is retired.  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    Physical Exam  General:  alert, well-developed, well-nourished, well-hydrated, appropriate dress, and normal appearance.   Head:  normocephalic and atraumatic.   Psych:  Oriented X3, normally interactive, good eye contact, not anxious appearing, and not depressed appearing.     Impression & Recommendations:  Problem # 1:  HYPERLIPIDEMIA (ICD-272.4) feels better on this med   and numbers acceptable   no ov se of med   rx med for now  The following medications were removed from the medication list:    Simvastatin 80 Mg Tabs (Simvastatin) .[name]... Take one tablet by mouth daily at bedtime Her updated medication list for this problem includes:    Lipitor 40 Mg Tabs (Atorvastatin calcium) .[name]... 1 by mouth once daily  Labs Reviewed: SGOT: 26 (02/24/2010)   SGPT: 28 (02/24/2010)  Prior 10 Yr Risk Heart Disease: N/A (09/15/2008)   HDL:61.10 (02/24/2010), 51.90 (05/31/2009)  LDL:64 (02/24/2010), 60 (05/31/2009)  Chol:144 (02/24/2010), 138 (05/31/2009)  Trig:93.0 (02/24/2010), 130.0  (05/31/2009)  Problem # 2:  OBESITY (ICD-278.00) counseled   disc weight watchers and other strategies  Ht: 66 (12/30/2009)   Wt: 234 (03/01/2010)   BMI: 37.26 (12/30/2009)  Problem # 3:  DEPRESSION, HX OF (ICD-V11.8) stable  Problem # 4:  HYPERGLYCEMIA (ICD-790.29) borderline as to a dx of diabetes and disc strategies and interventions     Problem # 5:  CORONARY ARTERY DISEASE (ICD-414.00)  Her updated medication list for this problem includes:    Tikosyn 250 Mcg Caps (Dofetilide) .[name]... 1 two times a day    Cozaar 100 Mg Tabs (Losartan potassium) .[name]... 1 tablet by mouth once daily    Metoprolol Succinate 100 Mg Xr24h-tab (Metoprolol succinate) .[name]... Take 1 tablet in the morning and 1/2 tablet inthe evening    Indapamide 2.5 Mg Tabs (Indapamide) .[name]... 1/2 qam    Aspirin 325 Mg Tabs (Aspirin) .[name]... Take 1 tablet by mouth once a day  Problem #  6:  COUMADIN THERAPY (ICD-V58.61) stable   Complete Medication List: 1)  Tikosyn 250 Mcg Caps (Dofetilide) .[name].. 1 two times a day 2)  Warfarin Sodium 5 Mg Tabs (Warfarin sodium) .... As directed 3)  Cozaar 100 Mg Tabs (Losartan potassium) .[name].. 1 tablet by mouth once daily 4)  Metoprolol Succinate 100 Mg Xr24h-tab (Metoprolol succinate) .... Take 1 tablet in the morning and 1/2 tablet inthe evening 5)  Indapamide 2.5 Mg Tabs (Indapamide) .... 1/2 qam 6)  Fluoxetine Hcl 20 Mg Caps (Fluoxetine hcl) .[name].. 1 qam 7)  Klor-con M20 20 Meq Cr-tabs (Potassium chloride crys cr) .[name].. 1 qam 8)  Lysine Hcl 1000 Mg Tabs (Lysine hcl) .... As needed 9)  Aspirin 325 Mg Tabs (Aspirin) .... Take 1 tablet by mouth once a day 10)  Multivitamins Tabs (Multiple vitamin) .... Take 1 tablet by mouth once a day 11)  Calcium Carbonate Powd (Calcium carbonate) .... Take 1 tablet by mouth once a day 12)  Vitamin D 1000 Unit Caps (Cholecalciferol) .... Take 1 tablet by mouth once a day 13)  Cpap  .... At bedtime 14)  Valacyclovir Hcl 1 Gm Tabs (Valacyclovir hcl) .... Take  2 by mouth and repeat in 12 hours for cold sores 15)  Ketoconazole 2 % Crea (Ketoconazole) .... Apply to rash two times a day for up to 2 weeks 16)  Lipitor 40 Mg Tabs (Atorvastatin calcium) .[name].. 1 by mouth once daily  Other Orders: Admin 1st Vaccine [id number]) Flu Vaccine 5yrs + [id number])  Hypertension Assessment/Plan:      The patient's hypertensive risk group is category C: Target organ damage and/or diabetes.  Today's blood pressure is 120/80.  Her blood pressure goal is < 140/90.  Patient Instructions: 1)  rec  try  weight watchers 2)  Preventive visit in December    labs plus hg a1c    3)  call iin the mean  time if needed.  Prescriptions: LIPITOR 40 MG TABS (ATORVASTATIN CALCIUM) 1 by mouth once daily  #30 x 6   Entered and Authorized by:   [name] MD   Signed by:   [name] MD on 03/01/2010   Method used:   Print then Give to Patient   RxID:   [id number]    Flu Vaccine Consent Questions     Do you have a history of severe allergic reactions to this vaccine? no    Any prior history of allergic reactions to egg and/or gelatin? no    Do you have a sensitivity to the preservative Thimersol? no    Do you have a past history of Guillan-Barre Syndrome? no    Do you currently have an acute febrile illness? no    Have you ever had a severe reaction to latex? no    Vaccine information given and explained to patient? yes    Are you currently pregnant? no    Lot Number:[id number]   Exp Date:12/31/2010   Site Given  Left Deltoid IM  .lbflu Sherron Monday, CMA (AAMA)  March 01, 2010 11:39 AM greater than 50% of visit spent in counseling  25 minutes      pt motivated   .    strategies discussed  .

## 2010-08-04 NOTE — Letter (Signed)
Summary: [facility]   Imported By: Laural Benes 06/29/2008 12:21:31  _____________________________________________________________________  External Attachment:    Type:   Image     Comment:   External Document

## 2010-08-04 NOTE — Cardiovascular Report (Signed)
Summary: Office Visit   Office Visit   Imported By: Sallee Provencal 07/21/2009 14:38:07  _____________________________________________________________________  External Attachment:    Type:   Image     Comment:   External Document

## 2010-08-10 NOTE — Assessment & Plan Note (Signed)
Summary: device check/mt      Allergies Added: NKDA  Visit Type:  Follow-up Referring Provider:  [name] Primary Provider:  [name] MD   History of Present Illness: Ms. Giacomo returns today for followup.  She has a h/o PAF, HTN, CAD and was previously diagnosed with sleep apnea and has had CPAP initiated.  She denies c/p, sob, peripheral edema and palpitations.  When I last saw her she noted some dietary indiscretion. Since then she has done well. She is tolerating her CPAP.  No other complaints today.  Current Medications (verified): 1)  Tikosyn 250 Mcg Caps (Dofetilide) .[name].. 1 Two Times A Day 2)  Pradaxa 150 Mg Caps (Dabigatran Etexilate Mesylate) .[name].. 1 By Mouth Two Times A Day 3)  Cozaar 100 Mg Tabs (Losartan Potassium) .[name].. 1 Tablet By Mouth Once Daily 4)  Metoprolol Succinate 100 Mg Xr24h-Tab (Metoprolol Succinate) .... Take 1 Tablet in The Morning and 1/2 Tablet Inthe Evening 5)  Indapamide 2.5 Mg Tabs (Indapamide) .... 1/2 Qam 6)  Fluoxetine Hcl 20 Mg Caps (Fluoxetine Hcl) .[name].. 1 Qam 7)  Klor-Con M20 20 Meq Cr-Tabs (Potassium Chloride Crys Cr) .[name].. 1 Qam 8)  Lysine Hcl 1000 Mg Tabs (Lysine Hcl) .... As Needed 9)  Aspirin 325 Mg  Tabs (Aspirin) .... Take 1 Tablet By Mouth Once A Day 10)  Multivitamins   Tabs (Multiple Vitamin) .... Take 1 Tablet By Mouth Once A Day 11)  Calcium Carbonate   Powd (Calcium Carbonate) .... Take 1 Tablet By Mouth Once A Day 12)  Vitamin D 1000 Unit Caps (Cholecalciferol) .... Take 1 Tablet By Mouth Once A Day 13)  Cpap .... At Bedtime 14)  Valacyclovir Hcl 1 Gm Tabs (Valacyclovir Hcl) .... Take 2 By Mouth and Repeat in 12 Hours For Cold Sores 15)  Ketoconazole 2 % Crea (Ketoconazole) .... Apply To Rash Two Times A Day For Up To 2 Weeks 16)  Lipitor 40 Mg Tabs (Atorvastatin Calcium) .[name].. 1 By Mouth Once Daily  Allergies (verified): No Known Drug Allergies  Past History:  Past Medical History: Last updated: 06/02/2009 Cerebrovascular  accident, hx of Coronary artery disease  history of stents 1989, 2003  Hypertension Depression chronic atrial fib  tachybradycardia pacemaker 12/08       LAST Mammogram: 2008 Pap: hyst Td: more than 5 years  Colonscopy: 3 years ago EKG: 05/15/08 Dexa: 3year ago  ok Eye Exam: 3 years ago  Zostavax  Other: coumadin therapy.  Smoking: former Consults Dr. Trenton Gammon Dr. Lovena Le Dr. Acie Fredrickson Blood transfusion Allergic rhinitis  Past Surgical History: Last updated: 06/02/2009 Hysterectomy bbilateral oopherectomy 1984  2 surgeries   endometriosis Permanent pacemaker 12 08  Back Surgeries x 2-3 Dr. Trenton Gammon G0 P0 C Stent Cholecystectomy 1984  Review of Systems  The patient denies chest pain, syncope, dyspnea on exertion, and peripheral edema.    Vital Signs:  Patient profile:   65 year old female Menstrual status:  hysterectomy Height:      66 inches Weight:      242 pounds Pulse rate:   66 / minute BP sitting:   132 / 80  (right arm)  Vitals Entered By: Margaretmary Bayley CMA (August 02, 2010 2:02 PM)  Physical Exam  General:  Well-developed,well-nourished,in no acute distress; alert,appropriate and cooperative throughout examination Head:  normocephalic and atraumatic.   Eyes:  PERRL, EOMs full, conjunctiva clear  Mouth:  pharynx pink and moist.   Neck:  No deformities, masses, or tenderness noted. Chest  Wall:  Well healed PPM incision. Lungs:  Normal respiratory effort, chest expands symmetrically. Lungs are clear to auscultation, no crackles or wheezes. Heart:  Normal rate and regular rhythm. S1 and S2 normal without gallop, murmur, click, rub or other extra sounds.   PPM Specifications Following MD:  Cristopher Peru, MD     PPM Vendor:  [employer] Jude     PPM Model Number:  [id number]     PPM Serial Number:  [id number] PPM DOI:  06/13/2007     PPM Implanting MD:  Cristopher Peru, MD  Lead 1    Location: [facility]     DOI: 06/13/2007     Model #: [id number]     Serial #: [id number]     Status: active Lead 2     Location: RV     DOI: 06/13/2007     Model #: [id number]     Serial #: [id number]     Status: active   Indications:  SSS   PPM Follow Up Battery Voltage:  2.79 V     Battery Est. Longevity:  10 YRS     Pacer Dependent:  No       PPM Device Measurements Atrium  Amplitude: 1.4 mV, Impedance: 478 ohms, Threshold: 0.50 V at 0.40 msec Right Ventricle  Amplitude: 12.0 mV, Impedance: 449 ohms, Threshold: 1.00 V at 0.40 msec  Episodes MS Episodes:  0     Coumadin:  Yes Ventricular High Rate:  0     Atrial Pacing:  <1%     Ventricular Pacing:  <1%  Parameters Mode:  DDD     Lower Rate Limit:  50     Upper Rate Limit:  120 Paced AV Delay:  200     Sensed AV Delay:  200 Next Cardiology Appt Due:  02/01/2011 Tech Comments:  NORMAL DEVICE FUNCTION.  NO EPISODES OR MODE SWITCHES SINCE LAST CHECK.  CHANGED RV OUTPUT FROM 2.0 TO 2.50 V.  ROV IN 6 MTHS W/DEVICE CLINIC. Shelly Bombard  August 02, 2010 2:08 PM MD Comments:  Agree with above.  Impression & Recommendations:  Problem # 1:  PACEMAKER, PERMANENT (ICD-V45.01) Her device is working normally and she is pacing minimally. will follow.  Problem # 2:  ATRIAL FIBRILLATION, PAROXYSMAL (ICD-427.31) She has had no atrial fibrillation since we last saw her a year ago as per her PPM. Her updated medication list for this problem includes:    Tikosyn 250 Mcg Caps (Dofetilide) .[name]... 1 two times a day    Metoprolol Succinate 100 Mg Xr24h-tab (Metoprolol succinate) .[name]... Take 1 tablet in the morning and 1/2 tablet inthe evening    Aspirin 325 Mg Tabs (Aspirin) .[name]... Take 1 tablet by mouth once a day  Problem # 3:  CORONARY ARTERY DISEASE (ICD-414.00) She denies anginal symptoms and will followup with Dr. Acie Fredrickson. Her updated medication list for this problem includes:    Metoprolol Succinate 100 Mg Xr24h-tab (Metoprolol succinate) .[name]... Take 1 tablet in the morning and 1/2 tablet inthe evening    Aspirin 325 Mg Tabs (Aspirin) .[name]... Take 1 tablet by  mouth once a day  Patient Instructions: 1)  Your physician recommends that you schedule a follow-up appointment in: 6 months with device clinic and 1 year with Dr. Lovena Le. 2)  Your physician recommends that you continue on your current medications as directed. Please refer to the Current Medication list given to you today.

## 2010-08-18 NOTE — Cardiovascular Report (Signed)
Summary: Office Visit   Office Visit   Imported By: Sallee Provencal 08/11/2010 16:17:28  _____________________________________________________________________  External Attachment:    Type:   Image     Comment:   External Document

## 2010-09-16 ENCOUNTER — Ambulatory Visit (INDEPENDENT_AMBULATORY_CARE_PROVIDER_SITE_OTHER): Payer: BC Managed Care – PPO | Admitting: Cardiovascular Disease

## 2010-09-16 DIAGNOSIS — Z950 Presence of cardiac pacemaker: Secondary | ICD-10-CM

## 2010-09-16 DIAGNOSIS — Z9861 Coronary angioplasty status: Secondary | ICD-10-CM

## 2010-09-28 ENCOUNTER — Other Ambulatory Visit (INDEPENDENT_AMBULATORY_CARE_PROVIDER_SITE_OTHER): Payer: BC Managed Care – PPO | Admitting: Internal Medicine

## 2010-09-28 DIAGNOSIS — R739 Hyperglycemia, unspecified: Secondary | ICD-10-CM

## 2010-09-28 DIAGNOSIS — R7309 Other abnormal glucose: Secondary | ICD-10-CM

## 2010-09-28 LAB — HEMOGLOBIN A1C: Hgb A1c MFr Bld: 6.4 % (ref 4.6–6.5)

## 2010-10-05 ENCOUNTER — Encounter: Payer: Self-pay | Admitting: Internal Medicine

## 2010-10-05 ENCOUNTER — Ambulatory Visit (INDEPENDENT_AMBULATORY_CARE_PROVIDER_SITE_OTHER): Payer: BC Managed Care – PPO | Admitting: Internal Medicine

## 2010-10-05 VITALS — BP 140/80 | HR 60 | Wt 248.0 lb

## 2010-10-05 DIAGNOSIS — IMO0002 Reserved for concepts with insufficient information to code with codable children: Secondary | ICD-10-CM

## 2010-10-05 DIAGNOSIS — E669 Obesity, unspecified: Secondary | ICD-10-CM

## 2010-10-05 DIAGNOSIS — H8110 Benign paroxysmal vertigo, unspecified ear: Secondary | ICD-10-CM

## 2010-10-05 DIAGNOSIS — G4733 Obstructive sleep apnea (adult) (pediatric): Secondary | ICD-10-CM

## 2010-10-05 DIAGNOSIS — I10 Essential (primary) hypertension: Secondary | ICD-10-CM

## 2010-10-05 DIAGNOSIS — I2510 Atherosclerotic heart disease of native coronary artery without angina pectoris: Secondary | ICD-10-CM

## 2010-10-05 DIAGNOSIS — Z950 Presence of cardiac pacemaker: Secondary | ICD-10-CM

## 2010-10-05 DIAGNOSIS — Z7901 Long term (current) use of anticoagulants: Secondary | ICD-10-CM

## 2010-10-05 DIAGNOSIS — R7309 Other abnormal glucose: Secondary | ICD-10-CM

## 2010-10-05 NOTE — Progress Notes (Signed)
  Subjective:    Patient ID: [name], female    DOB: June 23, 1946, 65 y.o.   MRN: [id number]  HPI Patient comes in today for follow-up of multiple medical problems. Since her last visit she's had no major changes in her health status.  and she's trying to remain physically active but has  limitations because of her joints. Hypertension:  Controlled no side effects of medication HYPERLIPDEMIA: on meds OSA: CPAP is been very helpful CV:  Has had her pacer check no angina on anticoagulation tx  Obesity:  Continues  Some changes  Dizzy when turns over in bed.   Past history and social history and family history updated in epic Review of Systems No chest pain or change in exercise tolerance she has noted that she has had only an occasional dizzy feeling when she turns over it bad that soon resolves without associated vision change balance problems nausea vomiting. It doesn't happen that often but she is reporting this. No change in her hearing. No falling. No bruising bleeding  She is on pradaxa     Objective:   Physical Exam WDWN in NAD  HEENT: Normocephalic ;atraumatic , Eyes;  PERRL, EOMs  Full, lids and conjunctiva clear,,Ears: no deformities, canals nl, TM landmarks normal, Nose: no deformity or discharge  Mouth : OP clear without lesion or edema . Neck supple no masses CV no g or murmurs  No clubbing cyanosis or edema  CHest Clear to A and P extr  No acute joint swelling .   Gait somewhat slow non focal . .     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 OBESITY:  Counseled again about  lifestyle intervention healthy eating and exercise .  Positional vertigo  Seems vestibular and  Not currently  active    . Call if   Becomes  persistent or progressive .  DJD  Weight loss will help.  OSA HT CAD HT Above  Problems addressed  Stable  Total visit 72mins > 50% spent counseling and coordinating care

## 2010-10-05 NOTE — Patient Instructions (Addendum)
Increase  Exercise as tolerated Consider weight watchers  . To avoid   Getting full blown diabetes.

## 2010-10-07 ENCOUNTER — Encounter: Payer: Self-pay | Admitting: Internal Medicine

## 2010-10-07 DIAGNOSIS — IMO0002 Reserved for concepts with insufficient information to code with codable children: Secondary | ICD-10-CM | POA: Insufficient documentation

## 2010-10-07 DIAGNOSIS — Z7901 Long term (current) use of anticoagulants: Secondary | ICD-10-CM | POA: Insufficient documentation

## 2010-10-19 ENCOUNTER — Telehealth: Payer: Self-pay | Admitting: *Deleted

## 2010-10-19 MED ORDER — ATORVASTATIN CALCIUM 40 MG PO TABS: 40.0000 mg | ORAL_TABLET | Freq: Every day | ORAL | Status: DC

## 2010-10-19 NOTE — Telephone Encounter (Signed)
Needs generic Lipitor sent to [facility].

## 2010-10-19 NOTE — Telephone Encounter (Signed)
rx faxed to pharmacy

## 2010-11-01 ENCOUNTER — Telehealth: Payer: Self-pay | Admitting: Internal Medicine

## 2010-11-01 MED ORDER — ATORVASTATIN CALCIUM 40 MG PO TABS: 40.0000 mg | ORAL_TABLET | Freq: Every day | ORAL | Status: DC

## 2010-11-01 NOTE — Telephone Encounter (Signed)
[facility]

## 2010-11-01 NOTE — Telephone Encounter (Signed)
Pt called and said that [facility] did not rcv refill for atorvastatin (LIPITOR) 40 MG tablet. [facility] has changed over to [facility] on 10/02/10. Pls resend script to [facility]. Their phone # is [phone]. They will need pt name, mem Id# [id number]) and dob. Pt only has 6 pills remaining. Pls expedite asap. Pls call in partial refill to [facility] [address]  [phone].  If there are any questions, pls contact pt.

## 2010-11-15 NOTE — Discharge Summary (Signed)
NAMEFINDLAY, DRAUS                  ACCOUNT NO.:  [id number]   MEDICAL RECORD NO.:  [id number]          PATIENT TYPE:  INP   LOCATION:  3712                         FACILITY:  [facility]   PHYSICIAN:  Champ Mungo. Lovena Le, MD    DATE OF BIRTH:  [birth year]   DATE OF ADMISSION:  03/25/2007  DATE OF DISCHARGE:                               DISCHARGE SUMMARY   This patient has no known drug allergies.  Greater than 40 minutes for  this exam and dictation.   FINAL DIAGNOSES:  1. Symptomatic atrial fibrillation (dizziness/fatigue/syncope).  2. Admitted this admission for Tikosyn therapy, discharging after      Tikosyn dose #8 with QTc of 470 milliseconds.   SECONDARY DIAGNOSES:  1. History of coronary artery disease status post stenting to the      right coronary artery with preserved ejection fraction.  2. History of inferior myocardial infarction.      a.     Bare-metal stent proximal to mid right coronary artery.      b.     Left heart catheterization December 2004:  Stents previously       placed were patent, ejection fraction 60%.      c.     Left heart catheterization June 2007:  The mid right       coronary artery stent had an in-stent restenosis.  A drug-eluting       stent was placed in the mid right coronary artery; ejection       fraction 60%.  3. Hospitalized November 2007 with pneumonia and decompensated      congestive heart failure in the setting of newly-diagnosed atrial      fibrillation.  4. Hypertension.  5. Dyslipidemia.  6. Anxiety/depression.   BRIEF HISTORY:  [name] is a 65 year old female.  She has a history of  ischemic heart disease.  She is stented in the past and she has had a  history of inferior myocardial infarction.  She also has hypertension  and dyslipidemia.   The patient has been diagnosed with atrial fibrillation and she has had  progressive experience with frequency of paroxysms, with more symptoms  noted.  When she is out of rhythm she become  lightheaded, fatigued,  transiently loses her vision.  The patient is very unhappy about her  overall health.  Treatment options have been discussed with the patient.  It is recommended that she start on antiarrhythmic therapy.  She will  present for elective Tikosyn therapy.   HOSPITAL COURSE:  The patient presented electively September 22.  Her  potassium was 3.8 and this was replenished prior to giving her first  dose of Tikosyn.  Her creatinine clearance was 98 and she was started on  500 mcg daily.  It became soon evident, however, that her QTc was  prolonged on the 500 mcg dose and she was cut back to 250.  She has  maintained sinus rhythm throughout this hospitalization and has done  well on the 250 mcg twice daily dose.  Her prolonged QTc has decreased  in length on the  250 mcg twice daily dose until her QTc is now 470 at  the time of discharge.  The patient has required an increased dose in  potassium.  She was admitted on 20 mEq.  She will now go home on 40 mEq  daily to maintain potassium greater than 4.  Her Coumadin has also been  adjusted upwards from 2.5 mg Tuesday, Wednesday, Thursday, Saturday,  Sunday to 3 mg Tuesday, Wednesday, Thursday, Saturday, Sunday, and her  INR has been about 2.1 for the last several days.   She discharges on the following medications:  1. New medication is Tikosyn 250 mcg twice daily.  2. Cozaar 50 mg daily.  3. Prozac 20 mg daily.  4. Zocor 80 mg daily at bedtime.  5. Indapamide 2.5 mg one-half tablet every morning.  6. Toprol-XL 100 mg daily.  7. New dose of Coumadin 3 mg Tuesday, Wednesday, Thursday, Saturday,      Sunday; 5 mg Monday and Friday.  8. Potassium chloride 40 mEq daily - this is a new dose.  9. Enteric-coated aspirin 325 mg daily.  10.Propranolol 10 mg every 6 hours as needed.   She is asked to discontinue loratadine.   She follows up at [facility], [address]  Wednesday, October 1 at 8:30 a.m. for  electrocardiogram to QTc and basic  metabolic panel and to see the Coumadin Clinic at 9:15 a.m.   LABORATORY STUDIES AT DISCHARGE:  Her INR is 2.1, pro time 24.1.  Serum  electrolytes on the day of discharge, September 26:  Sodium 136,  potassium 4.6, chloride 97, carbonate 30, BUN 17, creatinine 0.92,  glucose is 98.  Complete blood count this admission:  Hemoglobin 13.6,  hematocrit 39.5, white cells 8.9, platelets of 349.      [name], PA      Sarpy Lovena Le, MD  Electronically Signed    GM/MEDQ  D:  03/29/2007  T:  03/30/2007  Job:  [id number]   cc:   Thayer Headings, M.D.  Derinda Late, M.D.

## 2010-11-15 NOTE — Letter (Signed)
May 15, 2008    [name]. [name], [place], [place] [address]   RE:  [name], [name]  MRN:  [id number]  /  DOB:  25-Jul-1945   Dear Mariann Laster:   I am referring you a patient named [name] for ongoing followup.  The  patient was previously a patient of another primary care physician in  town but because of this physician has not charging a monthly fee to the  patient directly to allow the patient to be in his practice, she would  like to transfer her care and I have recommended you.  The patient is  [name].  [place]  Her husband is a fairly  well known Museum/gallery curator in town.  The patient has hypertension, paroxysmal  AFib, symptomatic bradycardia, status post pacemaker, and had a  dyslipidemia.  She has otherwise been stable.  I will send you a copy of  my most recent clinic visit.  I have instructed her to call your office  as she desires to change the primary care physician and I suggested you  might be a good fit with [name].  Please do not hesitate to contact  me for any additional questions.    Sincerely,      Champ Mungo. Lovena Le, MD  Electronically Signed    GWT/MedQ  DD: 05/15/2008  DT: 05/16/2008  Job #: [id number]

## 2010-11-15 NOTE — Consult Note (Signed)
NAMEJERMESHA, [name]                  ACCOUNT NO.:  [id number]   MEDICAL RECORD NO.:  [id number]          PATIENT TYPE:  OUT   LOCATION:  XRAY                         FACILITY:  [facility]   PHYSICIAN:  Sanjeev K. Deveshwar, M.D.DATE OF BIRTH:  [birth year]   DATE OF CONSULTATION:  06/15/2008  DATE OF DISCHARGE:                                 CONSULTATION   CHIEF COMPLAINT:  Cerebral aneurysm.   HISTORY OF PRESENT ILLNESS:  This is a very pleasant 65 year old female  who was admitted to [facility] by Dr. Gwendolyn Grant from  June 09, 2008 to June 10, 2008, after she presented with sudden  onset of a severe headache and diplopia.  She was seen in consultation  by Dr. Elvia Collum, one of the neurologists who felt that the  patient had suffered a small brainstem CVA.  A CT scan was performed on  June 08, 2008 that showed a question of a cerebral aneurysm, but no  infarct.  The patient was unable to have an MRI secondary to placement  of a permanent pacemaker in the past.  She has been referred to Dr.  Estanislado Pandy for further evaluation and discussion of treatment options for  her cerebral aneurysm and presents today for that consult accompanied by  her husband.   PAST MEDICAL HISTORY:  Significant for hyperlipidemia and hypertension.  She has history of coronary artery disease with previous cardiac stents.  She has a IT consultant.  She has chronic atrial  fibrillation.  She is on chronic Coumadin therapy.  Apparently, her INR  was therapeutic at time of her most recent admission.  She has a history  of a previous inferior MI.  Her ejection fraction has been estimated to  be 60%.  She had a mildly elevated hemoglobin A1c during her last  admission.   SURGICAL HISTORY:  Significant for an L3-L4 laminectomy performed in  2005 by Dr. Annette Stable.   ALLERGIES:  No known drug allergies.  She has tolerated contrast dye in  the past.   CURRENT MEDICATIONS:   Tikosyn, Coumadin, Cozaar, Lopressor, Zocor,  fluoxetine, Klor-Con, aspirin, vitamin C, and indapamide.   SOCIAL HISTORY:  The patient is married.  She and her husband live in  [place].  They do not have any children.  She has smoked  intermittently in the past.  She has quit.  She does not use alcohol.  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   FAMILY HISTORY:  Her mother is alive and well at age 51.  Her father  committed suicide when the patient was only 65 years old.  To her  knowledge, there was no family history of cerebral aneurysms.   IMPRESSION AND PLAN:  As noted, this patient presents today for further  evaluation of a 6-mm anterior communicating artery aneurysm by CT scan  performed on June 08, 2008.  She was admitted to the hospital at that  time with a suspected brainstem cerebrovascular accident.  She has  continued to have diplopia as well as a right ptosis.  She  had a severe  headache at time of admission.  She continues to have headaches,  although the headaches are less severe.  She rates her pain as a 4 on a  1-10 scale.  She takes ibuprofen on a p.r.n. basis.   The patient had very little understanding of aneurysms.  Aneurysms were  discussed in detail along with their significance.  Dr. Estanislado Pandy  reviewed the results of the CT scan that was performed while she was an  inpatient.  He pointed out the aneurysm to the patient and her husband.  It was felt at the time of admission that the aneurysm had nothing to do  with her neurological symptoms.  However, Dr. Estanislado Pandy felt that there  was a distinct possibility that the aneurysm might have leaked causing  her headache and possibly causing her right eye symptoms.  He felt in  view of the fact that she is currently anticoagulated on Coumadin, that  it would be prudent to consider intervention in the very near future.  Treatment options were discussed including conservative therapy with   continued monitoring versus open craniotomy and clipping versus  endovascular treatment with stenting and/or coiling.  The procedures  were described in detail along with the risks and benefits.  The patient  was also given some patient education materials to study at home.  All  of the patient's and her husband's questions were answered in detail.  Once again, Dr. Estanislado Pandy feels that this aneurysm should be treated  sooner rather than later due to ongoing symptoms of headache, diplopia,  and right ptosis.  He felt that the possibility of return of function of  her right eye would be much improved with early treatment.  The patient  and her husband expressed understanding and they would like to proceed  as soon as possible.   As noted, the patient does have chronic atrial fibrillation and is on  Coumadin therapy.  She also is on aspirin.  We will contact Dr. Tanna Furry  office regarding the need to stop the Coumadin.  It will be fine we  continue the aspirin.  We may need to start the patient on Plavix as  well.  She might also need bridging with Lovenox.   Dr. Estanislado Pandy will also discuss the case with Dr. Annette Stable, who has been the  patient's neurosurgeon in the past.  We will try to proceed as soon as  all the arrangements have been made and once we have confirmed the  availability of anesthesia.   Greater than 1 hour was spent on this consult.      [name], P.A.    ______________________________  Fritz Pickerel. Estanislado Pandy, M.D.    DR/MEDQ  D:  06/15/2008  T:  06/16/2008  Job:  [id number]   cc:   [name]. [name], MD  Champ Mungo. Lovena Le, MD  Alinda Deem, PharmD, BCPS, CPP

## 2010-11-15 NOTE — Assessment & Plan Note (Signed)
[name], [name]                         MRN:          [id number]  DATE:03/25/2007                            DOB:          [birth year]    HISTORY OF PRESENT ILLNESS:  The patient is a very pleasant, middle-aged  woman with a history of paroxysmal atrial fibrillation as well as  coronary artery disease in the past as well as obesity.  She had been on  Coumadin.  She has been on statin therapy, Plavix, aspirin and digoxin.  The patient was in her usual state of health when I saw her last week.  At that time, she had been in atrial fibrillation, but on the day of her  visit, she had gone back to sinus rhythm and was feeling okay.  She woke  up earlier this morning with palpitations and had the sensation of her  heart racing.  She knows this is associated with dyspnea and very mild  chest discomfort.  She denies peripheral edema or syncope.  She is here  for additional evaluation.   MEDICATIONS:  1. Cozaar 50 mg a day.  2. Warfarin as directed.  3. Simvastatin 80 mg a day.  4. Fluoxetine 20 mg a day.  5. Aspirin 325 mg daily.  6. Indapamide 2.5 mg half tablet daily.  7. Plavix 75 mg a day.  8. Metoprolol 100 mg in divided doses daily.  9. Propranolol 10 mg p.r.n.  10.Potassium 20 mEq a day.   FAMILY HISTORY:  Noncontributory.   SOCIAL HISTORY:  The patient denies tobacco or ethanol abuse stopping  smoking in 1984.   REVIEW OF SYSTEMS:  Negative except for some seasonal allergic rhinitis  and some arthritic symptoms in the past.   FAMILY HISTORY:  Noncontributory except for a brother with an MI at 59.   PHYSICAL EXAMINATION:  GENERAL:  She is a pleasant, middle-age woman in  no acute distress.  VITAL SIGNS: Blood pressure today was 134/86, pulse 115 and irregularly,  irregular, respirations 20, temperature 98.  HEENT:  Normocephalic, atraumatic.  Pupils equal round and  reactive to  light.  Oropharynx moist.  Sclerae nonicteric.  NECK:  No jugular venous distention.  No thyromegaly.  LUNGS:  Clear to auscultation bilaterally with no wheezes, rales or  rhonchi.  CARDIAC:  Irregularly, irregular rhythm with normal S1, S2.  PMI was not  enlarged nor laterally displaced.  ABDOMEN:  Soft, nontender, nondistended.  No organomegaly.  Bowel sounds  are present.  No rebound or guarding.  EXTREMITIES:  No clubbing, cyanosis or edema.  Pulses were 2+ and  symmetric.  NEUROLOGIC:  Alert and oriented x2 with cranial nerves intact.  Strength  was 5/5 and symmetric.   EKG demonstrates atrial fibrillation with a rapid ventricular response.  QT duration was around 440 msec.   IMPRESSION:  1. Coronary artery disease, status post stenting.  2. Paroxysmal atrial fibrillation, now becoming more severe.  3. Mild obesity.  4. Hypertension.   DISCUSSION:  [name] INR has been therapeutic and will plan on  having her coming in for Tikosyn initiation.  Labs will be drawn here in  our office.  I have asked that she come in later today to initiate  Tikosyn so that we can hopefully get her back to sinus rhythm sooner.     Champ Mungo. Lovena Le, MD  Electronically Signed    GWT/MedQ  DD: 03/25/2007  DT: 03/25/2007  Job #: [id number]   cc:   [place]ayer Headings, M.D.

## 2010-11-15 NOTE — H&P (Signed)
NAMEJAYLINN, [name]                  ACCOUNT NO.:  [id number]   MEDICAL RECORD NO.:  [id number]          PATIENT TYPE:  INP   LOCATION:  [id number]                         FACILITY:  [facility]   PHYSICIAN:  Farris Has, MDDATE OF BIRTH:  Dec 11, 1945   DATE OF ADMISSION:  06/09/2008  DATE OF DISCHARGE:                              HISTORY & PHYSICAL   PRIMARY CARE PHYSICIAN:  [name]. [name], MD   CARDIOLOGIST:  Champ Mungo. Lovena Le, MD   HISTORY OF PRESENT ILLNESS:  The patient has history of coronary artery  disease and atrial fibrillation for which she takes Coumadin.  She was  complaining of diplopia since Sunday.  It seems to come and go, mainly  worse when she looks to the right.  The patient initially presented to  the emergency department with the above mentioned symptoms.  Neurology  consult was done.  A CT of the brain was performed that showed a small 6  mm aneurysm which Neurology felt was not explaining her symptoms.  In  Neurology, maybe she had a small central stroke.  Patient unable to  obtain MRI secondary to having a pacemaker placed, at which point [facility] was called to admission.  Otherwise, no other complaints.  No nausea, no vomiting, no diarrhea, no constipation or chest pain or  shortness of breath.  The patient has been doing fairly well except for  above mentioned symptoms.   REVIEW OF SYSTEMS:  Otherwise, unremarkable.   PAST MEDICAL HISTORY:  1. Atrial fibrillation on chronic Coumadin.  2. Coronary artery disease.  3. Bradycardia requiring pacemaker.  4. Hypertension.  5. Dyslipidemia.  6. The patient is status post stents in 2007.   SOCIAL HISTORY:  The patient used to smoke from 57 to 1984 but  currently quit.  Does not drink alcohol.  Lives at home with her  husband.   FAMILY HISTORY:  Significant for father with diabetes, otherwise,  unremarkable.   ALLERGIES:  No known drug allergies.   MEDICATIONS:  1. Tikosyn 250 mcg b.i.d.  2.  Coumadin 3 mg daily.  3. Cozaar 50 mg q.a.m.  4. Metoprolol 100 mg in a.m. and 50 mg q.h.s.  5. Simvastatin 80 mg daily.  6. Indapamide 1.25 mg daily.  7. Fluoxetine 20 mg daily.  8. Potassium 40 mEq daily.  9. Aspirin 325 mg.  10.Vitamin C.  11.Multivitamin.  12.Visine.   PHYSICAL EXAMINATION:  VITAL SIGNS:  Temperature 97.5, blood pressure  initially 177/86, reportedly down to 110s, pulse 62, respirations 22,  saturating 97% on room air.  GENERAL:  The patient appears to be in no acute distress.  Mildly obese  female sitting down on the bed.  HEENT:  Head nontraumatic.  Mucous membranes moist.  HEART:  Regular.  No murmurs, rubs or gallops.  LUNGS:  Clear to auscultation bilaterally.  ABDOMEN:  Soft, nontender, nondistended.  Normal bowel sounds present.  EXTREMITIES:  Lower extremities without cyanosis, clubbing or edema.  NEUROLOGICAL:  Cranial nerves II-XII intact.  Strength 5/5 in all four  extremities.  The patient states that she  sees double when she looks all  the way to her right, although, I do not see any evidence of ocular  muscle paralysis or weakness.   LABORATORY DATA:  White blood cell count 8.7, hemoglobin 15, sodium 134,  potassium 4.4, creatinine 1.1.  UA within normal limits.  CT scan of the  head without contrast unremarkable except for small vessel disease and  CT of the head showed no evidence of venous sinus thrombosis or venous  infarction.  There is a 6 mm anterior communicating artery aneurysm.  No  EKG ordered.   ASSESSMENT/PLAN:  1. This is a 65 year old female with history of coronary artery      disease on atrial fibrillation on Coumadin and full dose aspirin      who presents with diplopia which is intermittent and worse when she      looks over to her right.  Neurology has seen the patient.  She was      noted to have a small anterior communicating artery aneurysm which      Neurology feels does not explain the symptoms.  Will admit for       further observation and do a stroke workup.  2. Questionable CVA.  The patient unable to obtain MRI secondary to      pacemaker.  Will obtain a 2D echo and EKG.  Homocysteine level,      fasting lipid panel. Check hemoglobin A1C.  Will check carotid      Dopplers.  Neurology consult.  3. Atrial fibrillation.  Continue Tikosyn, Coumadin.  She is already      on full dose aspirin as well.  Continue with Toprol.  To avoid      blood pressure drop will do 50 b.i.d.  4. Hypertension.  To avoid over aggressive treatment, if this was      possible stroke, will decrease dose of metoprolol, continue Cozaar      and hold indapamide.  5. Hyperlipidemia.  Continue simvastatin.  Check fasting lipid panel.  6. Prophylaxis.  Protonix, SCDs.   Valerie A. Asa Lente, MD will assume care in the a.m.      Farris Has, MD  Electronically Signed     AVD/MEDQ  D:  06/09/2008  T:  06/09/2008  Job:  [id number]

## 2010-11-15 NOTE — Assessment & Plan Note (Signed)
[name] OFFICE NOTE   NAME:[name], Samantha HOPE                         MRN:          [id number]  DATE:01/06/2008                            DOB:          1945-09-18    [name] returns today for followup.  She is a very pleasant middle-  aged woman with history of coronary artery disease status post stenting,  a history of paroxysmal AFib, and symptomatic bradycardia who has been  very nicely controlled now on Tikosyn with a pacemaker.  She returns  today for followup.  She is still frustrated by inability to lose  weight.  She has been hypertensive.  She had no specific complaints  today except for pain in her knees bilaterally.   MEDICATIONS:  1. Potassium 20 a day.  2. Tikosyn 250 twice a day.  3. Metoprolol 100 in the morning, 50 in the evening.  4. Multiple vitamins.  5. Aspirin 325 a day.  6. Simvastatin 80 a day.  7. Warfarin as directed.  8. Cozaar 50 a day.   PHYSICAL EXAMINATION:  GENERAL:  She is a pleasant well-appearing,  middle-aged woman, in no distress.  VITAL SIGNS:  Blood pressure is 170/80.  The pulse is 59 and regular.  The respirations are 18.  The weight is 223 pounds.  NECK:  No jugular venous distention.  LUNGS:  Clear bilaterally to auscultation.  No wheezes, rales, or  rhonchi are present.  CARDIOVASCULAR:  A regular rate and rhythm with normal S1 and S2.  Pacemaker insertion site is healed nicely.  EXTREMITIES:  Demonstrated no edema.   Interrogation of her pacemaker demonstrates a Radio broadcast assistant.  P waves  were greater than 2.  The R waves greater than 12.  The impedance 479.  The AA 447.  The V battery voltage was 2.8 volts.  The patient's AFib  was 3.3% at that time.  She was A pacing less than 1% and V pacing less  than 1%.   IMPRESSION:  1. Symptomatic tachycardia-bradycardia syndrome.  2. Paroxysmal atrial fibrillation.  3. Coronary artery disease.  4. Status post  pacemaker insertion.   DISCUSSION:  Overall, [name] is stable and her pacemaker is working  normally.  She is maintaining sinus rhythm approximately 97% of the  time.  She will continue on with her  Coumadin and her Tikosyn as previously described.  We discussed the  importance of weight loss and exercise.  I will see her back in November  2009.     Champ Mungo. Lovena Le, MD  Electronically Signed    GWT/MedQ  DD: 01/06/2008  DT: 01/06/2008  Job #: [id number]   cc:   Thayer Headings, M.D.  Derinda Late, M.D.

## 2010-11-15 NOTE — Assessment & Plan Note (Signed)
[facility] NOTE   NAME:CHEEKAberdeen, Mescall                         MRN:          [id number]  DATE:07/19/2007                            DOB:          Aug 09, 1945    [name] is seen in the Coumadin Clinic today for continued evaluation,  medication titration associated with her history of atrial fibrillation.  She notes that she has had multiple increased blood pressures noted when  she has been seen in the office both here and at her primary care  physician's.  She has had no chest pain, shortness of breath or  weakness.  She brings her blood pressure cuff from home in today to  calibrate.  On the original check I found her blood pressure to be  160/92, on her cuff we received a value of 166/98.  I am pleased that  she has been doing well overall.  She is not having headaches or other  issues, but we will need to increase her medications.  Thus, I have  asked her to increase her Cozaar to 100 mg daily.  She will call with  questions or problems in the meantime.  She will have a BNP checked in  10 days when she returns for her next coag visit.      [name], PharmD, BCPS, CPP  Electronically Signed      Minus Breeding, MD, [facility]  Electronically Signed   MP/MedQ  DD: 07/19/2007  DT: 07/19/2007  Job #: [id number]

## 2010-11-15 NOTE — Assessment & Plan Note (Signed)
[place] OFFICE NOTE   NAME:[name], Samantha RIEVES                         MRN:          [id number]  DATE:06/11/2007                            DOB:          08-07-1945    [name] returns today for follow up.  She is a very pleasant, middle-  aged woman with coronary artery disease, paroxysmal atrial fibrillation,  hypertension, and dyslipidemia who noted several weeks ago when I saw  her that she was having some dizzy spells and lightheaded spells.  We  placed her on Tikosyn several months ago, and her episodes of atrial  fibrillation decreased in frequency and severity, but in conjunction  with her beta blocker she would note dizzy spells.  I was concerned  about bradycardia or perhaps rapid atrial fibrillation as the etiology  of these spells.  She returns today for followup.   In the interim, she has worn a cardiac monitor, which has demonstrated  pauses up to 4 seconds.  She still goes in to atrial fibrillation, but  for much less duration at rates up to 140 beats per minute, as well.  The patient has had no frank syncope, but has had several episodes of  near syncope, which lasts for several seconds before resolving.  She  notes now, that because of these symptoms, she does not go out and drive  when she feels like her heart is beating irregularly.  She denies chest  pain or shortness of breath otherwise.   MEDICATIONS INCLUDE:  1. Tikosyn 250 mg twice daily.  2. Metoprolol 100 mg in the morning and 50 mg in the evening.  3. Potassium supplements.  4. Cozaar 50 mg a day.  5. Warfarin as directed.  6. Simvastatin.  7. Aspirin 325 mg daily.   PHYSICAL EXAM:  Notable for her being a pleasant, well-appearing, middle-  aged woman in no acute distress.  The blood pressure today was 122/78; the pulse was 72 and regular; the  respirations were 18; the weight was 221 pounds.  NECK:  Revealed no jugular venous  distention.  LUNGS:  Clear bilaterally to auscultation.  CARDIOVASCULAR SYSTEM:  Revealed a regular rate and rhythm with a normal  S1 and S2.  EXTREMITIES:  Demonstrated no edema.   IMPRESSION:  1. Symptomatic tachybrady syndrome.  2. Paroxysmal atrial fibrillation.  3. Tikosyn therapy.  4. Dizziness and near syncope secondary to #1 and #2.   DISCUSSION:  I have discussed the treatment options with the patient.  The patient needs her beta blocker because she also has coronary disease  in addition to atrial fibrillation.  One option would be that we stop  the Tikosyn, and decrease her beta blocker, and start her on amiodarone.  A second option would be referral for catheter ablation of her atrial  fibrillation with decrease in her beta blocker.  A third option would be  backup pacemaker implantation, and continuation of her present medical  therapy.  The pros-and-cons of each of the above treatment options have  been discussed with the patient, and she would like to  proceed with  pacemaker implantation to prevent bradycardia in the setting of  paroxysmal atrial fibrillation.  The risks, benefits, goals, and  expectations of the procedure have been discussed with the patient; she  would like to proceed.  We will schedule this at the earliest possible  convenient time.     Champ Mungo. Lovena Le, MD  Electronically Signed    GWT/MedQ  DD: 06/11/2007  DT: 06/11/2007  Job #: [id number]   cc:   Derinda Late, M.D.

## 2010-11-15 NOTE — Assessment & Plan Note (Signed)
[place] OFFICE NOTE   NAME:[name], [name]                         MRN:          [id number]  DATE:05/15/2008                            DOB:          02-06-1946    [name] returns today for followup.  She is a very pleasant middle-  aged woman with paroxysmal atrial fibrillation, symptomatic tachy-brady  syndrome, and coronary artery disease.  She returns today for followup.  She denies chest pain.  She denies shortness of breath.  She has rare  palpitations.  She notes that she was out of rhythm for about 4 hours  earlier this morning.  She has gone back to rhythm.  She had no other  complaints today.  She does get some dyspnea with exertion when she is  in Afib.  Her medicines __________ mg 2 tablets a day, Tikosyn 250 mcg a  day, metoprolol 100 mg in the morning and 50 mg in the evening, Cozaar  50 mg, and multiple vitamins.  She is also on simvastatin 80 a day and  warfarin as directed.   PHYSICAL EXAMINATION:  GENERAL:  This is a pleasant well-appearing  middle-aged woman in no distress.  VITAL SIGNS:  Blood pressure 106/70, the pulse 60 and regular, the  respirations were 18, the weight was 221 pounds.  NECK:  No jugular distention.  LUNGS:  Clear to bilaterally auscultation.  No wheezes, rales, or  rhonchi are present.  There is no increased work of  breathing.  CARDIAC:  Regular rate and rhythm.  Normal S1 and S2.  ABDOMEN:  Soft and nontender.  EXTREMITIES:  Demonstrated no edema.   Interrogation of her pacemaker demonstrates a Radio broadcast assistant.  P-waves  were 2.  The R-waves greater than 12.  The impedance 497 in the atrium,  49 in the right ventricle with a threshold of 0.5 at 0.4 in the A, 0.75  at 0.4 in the RV.  Battery voltage was 2.8 volts.  The patient was mode  switched to 4% at that time.  Her underlying rhythm was sinus brady at  50.   IMPRESSION:  1. Symptomatic bradycardia.  2.  Paroxysmal atrial fibrillation.  3. Hypertension.  4. Status post pacemaker insertion.   DISCUSSION:  Overall, [name] is stable.  She is maintaining sinus  rhythm very nicely.  We will plan and see her back in the office in  several months.  She will continue 250 mcg twice daily of Tikosyn.      Champ Mungo. Lovena Le, MD  Electronically Signed    GWT/MedQ  DD: 05/15/2008  DT: 05/16/2008  Job #: [id number]   cc:   [name]. [name], MD

## 2010-11-15 NOTE — Discharge Summary (Signed)
Samantha, [name]                  ACCOUNT NO.:  [id number]   MEDICAL RECORD NO.:  [id number]          PATIENT TYPE:  INP   LOCATION:  3020                         FACILITY:  [facility]   PHYSICIAN:  Valerie A. Asa Lente, MDDATE OF BIRTH:  1945-12-17   DATE OF ADMISSION:  06/09/2008  DATE OF DISCHARGE:  06/10/2008                               DISCHARGE SUMMARY   DISCHARGE DIAGNOSES:  1. Diplopia secondary to small brain stem cerebrovascular accident.  2. A 6-mm anterior communicating artery aneurysm noted on CT head,      felt not to be contributing to the patient's symptoms of diplopia      per Neurology.  Plan for outpatient followup with Dr. Estanislado Pandy.  3. History of chronic atrial fibrillation on aspirin and Coumadin      prior to admission with therapeutic INR on admission.  2-D echo      results pending at the time of this dictation.  4. Hypertension, uncontrolled on admission.  5. Dyslipidemia.  6. History of coronary artery disease with stenting in 2007.  7. Mild elevation of hemoglobin A1c.   HISTORY OF PRESENT ILLNESS:  [name] is a 65 year old white female  admitted on June 09, 2008, with a chief complaint of diplopia since  Sunday prior to admission.  Apparently symptoms come and go, but are  mainly worse when she looks to the right.  Neurology was consulted.  CT  of the brain performed with contrast noted a small 6-mm aneurysm, which  Neurology felt was not explaining her symptoms.  They felt that she  probably had a small brain stem CVA; however, they were unable to  perform an MRI as the patient has history of permanent pacemaker.  She  was admitted for further evaluation and treatment.   COURSE OF HOSPITALIZATION:  1. Diplopia secondary to small brain stem CVA.  The patient was      admitted.  She was continued on her Coumadin, aspirin, and statin.      Unfortunately, a fasting lipid profile and homocysteine level had      not yet been drawn and the patient has  had breakfast.  These will      need to be drawn as an outpatient and her followup appointment with      Dr. [name].  It is recommended by Neurology that should she not      have improvement in her visual symptoms that she would be      considered for followup with and a neuro-ophthalmologist.  2. A 6-mm anterior communicating artery aneurysm.  This was noted      incidentally on contrasted CT head.  Per Neurology, it is not felt      to be contributing to the patient's diplopia.  We have arranged      outpatient followup with Dr. Estanislado Pandy for consultation of      Interventional Radiology.  3. History of chronic atrial fibrillation.  The patient maintained      stable heart rate during this admission.  She was continued with      aspirin  and Coumadin, remains therapeutic on her Coumadin.  4. Hypertension.  Her blood pressure was noted to be elevated at the      time of admission.  Initially 177/86; however, has remained      relatively stable throughout her hospitalization ranging 0000000      systolic to [id number] systolic.  We will continue her home meds for now      and close outpatient followup.  5. Dyslipidemia.  She is maintained on her statin.  She does need an      outpatient fasting lipid profile.  6. History of coronary artery disease with stent in 2007.  She remains      stable from cardiac standpoint without symptoms.  Plan to continue      med management prior to admission and outpatient followup with Dr.      Cristopher Peru as planned.  7. Mild elevation of hemoglobin A1c.  The patient's hemoglobin A1c was      checked this admission and was noted to be 6.3 indicating mild      hyperglycemia.  She has been instructed on carb-modified diet, this      will need close outpatient monitoring.   MEDICATIONS AT THE TIME OF DISCHARGE:  1. Tikosyn 250 mcg p.o. b.i.d.  2. Coumadin 3 mg p.o. daily.  3. Cozaar 50 mg p.o. daily.  4. Lopressor 100 mg in the morning and 50 mg in the evening.   5. Zocor 80 mg p.o. daily.  6. Dopamine 2.5 mg one-half tab p.o. q.a.m.  7. Fluoxetine 20 mg p.o. daily.  8. Klor-Con 20 mEq 2 tablets p.o. daily.  9. Aspirin 325 mg p.o. daily.  10.Vitamin C, multivitamin, calcium, lysine B daily.   PERTINENT LABORATORIES AT THE TIME OF DISCHARGE:  BUN 12, creatinine  0.72, INR 2.1, hemoglobin A1c 6.3.   DISPOSITION:  She will be discharged to home.   FOLLOWUP:  She is to follow up with Dr. [name] on June 19, 2008, at 9:30 a.m.  She is also to follow up with Dr. Estanislado Pandy on  June 15, 2008, at 2 o'clock p.m. for consultation in regards to her  aneurysm.  She is instructed to keep her appointment until [facility] as scheduled.  She will need fasting homocysteine and  fasting lipid panel profile drawn at her appointment with Dr. [name] on  June 19, 2008, she is instructed to arrive fasting to that  appointment.   Greater than 30 minutes were spent on discharge planning.      Debbrah Alar, NP      Jannifer Rodney. Asa Lente, MD  Electronically Signed    MO/MEDQ  D:  06/10/2008  T:  06/10/2008  Job:  [id number]   cc:   [name]. [name], MD  Fritz Pickerel. Estanislado Pandy, M.D.  Donnelly Angelica, M.D.

## 2010-11-15 NOTE — Assessment & Plan Note (Signed)
[place] OFFICE NOTE   NAME:[name], [name]                         MRN:          [id number]  DATE:05/09/2007                            DOB:          July 17, 1945    [name] is seen as an add-on today at the request of the Coumadin  clinic.  She has a history of atrial fibrillation for which she takes  Tikosyn.  She had recurrent atrial fibrillation by her recount last  night.  She had accompanying dizziness.  She also had more dizziness  this morning and the quality of her irregularity changed.  She went from  having what she describes as fasting irregular to pausing and irregular.  While she was being hooked up for an electrocardiogram in the Coumadin  clinic, a sinus pause was noted which appeared to be sinus node exit  block.   Because of her tachy palpitations and because of her recurrent  dizziness, I think event recording in real time is essential,  particularly given the poor rhythmic risks of the Tikosyn.   I have reviewed this with her and she is agreeable.  We will plan to  have her come back and see Dr. Lovena Le in 2-3 weeks' time to review the  aforementioned information.   In the interim, she will continue her medications.  Her blood pressure  today was 116/76, her pulse was 60, her lungs were clear, her heart  sounds were regular, and the extremities had no edema.     Deboraha Sprang, MD, [facility]  Electronically Signed    SCK/MedQ  DD: 05/09/2007  DT: 05/09/2007  Job #: [id number]

## 2010-11-15 NOTE — Consult Note (Signed)
[name], [name]                  ACCOUNT NO.:  [id number]   MEDICAL RECORD NO.:  [id number]          PATIENT TYPE:  INP   LOCATION:  3020                         FACILITY:  [facility]   PHYSICIAN:  Legrand Como L. Reynolds, M.D.DATE OF BIRTH:  12-May-1946   DATE OF CONSULTATION:  06/08/2008  DATE OF DISCHARGE:                                 CONSULTATION   REASON FOR EVALUATION:  Headache and visual changes.   HISTORY OF PRESENT ILLNESS:  This is the initial inpatient consultation  evaluation of this 65 year old woman with a past medical history which  includes coronary artery disease and pacemaker placement.  The patient  also has a history of hypertension for which she is on several  medications and is chronically anticoagulated.  She presented to the  emergency department today with a 2-day history of headache and visual  disturbance.  The patient states that her headache is unlike any  headache she has had before.  She describes it as a constant, dull pain  in the frontal area, just over the right eye.  Headache is not throbbing  in character.  It is not associated with nausea.  She denies any  associated vertigo.  She states the headache seems to be a little bit  worse when she gets up and walks around, it is better when she lies  down.  The headache actually woke her from sleep about 2:00 a.m.  yesterday morning, and has been intermittent since.  It actually has  resolved at this time.  In addition to this, over the same period of  time she has had visual disturbances.  She describes this as diplopia,  which seems to be worse when she looks off to the right.  She notes  vertical displacement of objects.  Her vertigo resolves when she closes  one eye or the other.  She states that when she has one or the other eye  closed, she has no visual defect of which she is aware.  She denies any  associated facial drooping, slurred speech, numbness, speech difficulty,  or weakness of any  extremity.  She thinks her symptoms today are better  than they were yesterday.  Upon admission to the emergency room, her  blood pressure was very elevated at 170/110 range, but it has come down  now and she actually states that her headache has resolved and her  diplopia seems a little bit better.  Neurologic consultation is  requested and admission is being undertaken at this time.   PAST MEDICAL HISTORY:  She has a history of atrial fibrillation for  which she is chronically anticoagulated.  She has symptomatic tachybrady  syndrome, for which she underwent pacemaker placement a few years ago.  She has history of coronary artery disease with history of stent  placement.  She is on medication for hypertension and hyperlipidemia and  is followed by the [facility].   FAMILY HISTORY/SOCIAL HISTORY/REVIEW OF SYSTEMS:  Per admission H and P  which is outlined elsewhere.   MEDICATIONS:  Tikosyn, Cozaar, Toprol, simvastatin, indapamide,  fluoxetine 20 mg daily,  potassium, full-dose aspirin, and multivitamins.   PHYSICAL EXAMINATION:  VITAL SIGNS:  Temperature 97.5; blood pressure  177/86, down to 131/71; pulse 62; and respirations 16.  GENERAL:  This is an obese, but otherwise healthy-appearing woman,  supine in hospital bed, in no distress.  HEENT:  Head, cranium is normocephalic and atraumatic.  Oropharynx  benign.  NECK:  Supple without carotid or supraclavicular bruits.  HEART:  Regular rate and rhythm without murmurs.  NEUROLOGIC:  Mental status, she is awake and alert.  She is fully  oriented to time, place, and person.  Recent and remote memory are  intact.  Attention span, concentration, and fund of knowledge are all  appropriate.  Speech is fluent and not dysarthric.  Mood is euthymic and  affect appropriate.  Cranial nerves; pupils equal and briskly reactive.  Examination of extraocular movements reveals no definite defects of  extraocular excursion, but she does report  diplopia which is worse or  greater in vertical with rightward vision, greater in rightward down  gaze, and confirmed with [facility] testing.  Visual fields full to  confrontation.  Hearing is intact to conversational speech.  Facial  sensation is intact to pinprick.  Face, tongue, and palate move normally  and symmetrically.  Motor; normal bulk and tone.  Normal strength in all  tested extremity muscles.  Sensation is intact to pinprick and light  touch in all extremities.  Coordination; finger-to-nose and heel-to-shin  performed accurately.  Gait; she stands easily from the gurney.  Stable  to ambulate without difficulty and can perform Romberg maneuver.  Reflexes are 2+ and symmetric.  Toes are downgoing bilaterally.   LABORATORY DATA:  Urinalysis is negative.  INR is 2.5.  CBC is normal.  Chemistries are normal.  CT of the head demonstrates no acute findings  with small vessel disease and an old left-sided subcortical infarct.  No  lesions in the brainstem.  CT angiogram and venogram demonstrate a 6-mm  aneurysm in the anterior communicating artery, but no significant  occlusions.  No evidence of venous thrombosis.   IMPRESSION:  1. Suspect tiny brainstem stroke with improving diplopia and headache      in a patient hypertensive on presentation and already on aspirin,      Coumadin, and aggressive risk factor control.  2. A 6-mm anterior communicating artery aneurysm, which is an      incidental finding, not related to above.   RECOMMENDATIONS:  Continue aspirin and Coumadin, and refine blood  pressure control.  Anticipate her symptoms will resolve, but if they  persists, consider evaluation by neuro ophthalmologist such as Dr.  Hassell Done at [facility].  She may also see Dr. Estanislado Pandy for her aneurysm if  she desires evaluation for endovascular treatment, but I think that her  aneurysm is pretty low-risk lesion and therefore, she does not need this  evaluated during her hospitalization.   Thank you for the consultation.      Michael L. Doy Mince, M.D.  Electronically Signed     MLR/MEDQ  D:  06/08/2008  T:  06/09/2008  Job:  [id number]

## 2010-11-15 NOTE — Assessment & Plan Note (Signed)
[place] OFFICE NOTE   NAME:[name]                         MRN:          [id number]  DATE:03/20/2007                            DOB:          [birth year]    [name].  She is a very pleasant 65-year-  old woman with a history of ischemic heart disease, status post  stenting.  Also with a history of hypertension and dyslipidemia, who  returns today for [name].  I saw her most recently back in April, and  at that time, she had a slight increase in her intensity and atrial  fibrillation, noting that her A fib was going out of rhythm more  frequently than it had been previously.  At that time, would increase  her dose of Toprol to 250 mg daily, taking it in divided doses.  She  returns today for [name].  She notes that in the last several weeks,  her A fib has been much worse, and she has actually been out of rhythm  more than she is in rhythm, and when she is out of rhythm, she tries to  stand up, she becomes lightheaded and fatigued, and transiently loses  her vision.  This is the result of her being afraid of driving and  overall feeling very poorly.  The patient is quite unhappy about her  overall health.   PHYSICAL EXAMINATION:  She is a pleasant middle-aged woman in no acute  distress.  Blood pressure was 130/84.  Pulse was 68 and regular.  Respirations were  16.  Weight was 219 pounds.  NECK:  No jugular venous distention.  There is no thyromegaly.  LUNGS:  Clear bilaterally to auscultation.  There are no wheezes, rales  or rhonchi present.  There is no increased work of breathing.  CARDIOVASCULAR:  Regular rate and rhythm with a normal S1 and S2.  There  are no murmurs, rubs or gallops are present.  The PMI was not enlarged  nor laterally displaced.  ABDOMEN:  Soft, nontender, nondistended.  There is no organomegaly.  EXTREMITIES:  Trace peripheral edema  bilaterally.  There is no clubbing  or cyanosis noted.   MEDICATIONS:  1. Cozaar 50 a day.  2. Loratadine 10 mg daily.  3. Warfarin as directed.  4. Simvastatin 80 a day.  5. Fluoxetine 20 daily.  6. Aspirin 325 daily.  7. Multivitamins.  8. Plavix 75 daily.  9. Metoprolol 100 mg daily in the long-acting form.  10.She is also on potassium supplements.   EKG today demonstrates sinus rhythm with normal axis and intervals.   IMPRESSION:  1. Paroxysmal atrial fibrillation with much increased frequency and      severely.  2. Ischemic heart disease with preserved left ventricular function.  3. Dyslipidemia.   DISCUSSION:  I have discussed the treatment options with the patient.  I  have recommended that she be started on anti-arrhythmic drug therapy  because of her known coronary disease.  I have given the option of  amiodarone versus Tikosyn, requiring hospitalization.  After carefully  discussing each of the above options, she would prefer to come in and  undergo Tikosyn initiation.  This will be scheduled at the earliest  possible convenient time.     Champ Mungo. Lovena Le, MD  Electronically Signed    GWT/MedQ  DD: 03/20/2007  DT: 03/21/2007  Job #: [id number]   cc:   Derinda Late, M.D.

## 2010-11-15 NOTE — Assessment & Plan Note (Signed)
[place]                                 ON-CALL NOTE   NAME:CHEEKLilianne, Dronen                           MRN:          [id number]  DATE:05/17/2007                            DOB:          April 11, 1946    CARDIOLOGIST:  Dr. Cristopher Peru   TELEPHONE NUMBER:  [phone]   HISTORY:  Ms. Walborn is a 65 year old female patient with a history of  paroxysmal atrial fibrillation on Coumadin therapy, as well as Tikosyn  therapy. She was added on to Dr. Olin Pia schedule on November 6th with  recurrent palpitations suggestive of recurrent atrial fibrillation. She  was actually noted to be in sinus rhythm when she had her EKG hooked up.  However, she was placed on an event monitor. Tom, from [facility], called  the answering service tonight, as the patient has gone back into atrial  fibrillation. Her heart rate is 110 to 130. I contacted Ms. Luttrull by  phone. She notes that she has had some irregular heartbeats and she is  slightly short of breath. Her symptoms are not anything different from  what she has had in the past. She denies any chest pain, syncope, or  near syncope. She denies any significant dyspnea on exertion.   PLAN:  I have left a message at the office for someone to contact the  patient tomorrow. She probably needs to be brought in for follow up to  ensure that her rate is well controlled. She will also need to be set up  with earlier follow up with Dr. Lovena Le to make a decision going forward  regarding her Tikosyn and treatment of her atrial fibrillation. I have  explained to the patient that if she becomes more symptomatic tonight  that she should go to the emergency room. Otherwise, someone from our  office will contact her tomorrow.   DISPOSITION:  As noted above.      Richardson Dopp, PA-C  Electronically Signed      Marijo Conception. Verl Blalock, MD, [facility]  Electronically Signed   SW/MedQ  DD: 05/19/2007  DT: 05/20/2007  Job #: [id number]

## 2010-11-15 NOTE — Assessment & Plan Note (Signed)
[facility] OFFICE NOTE   NAME:[name], Samantha SWAMINATHAN                         MRN:          [id number]  DATE:07/09/2007                            DOB:          07/05/1945    [name] returns today for follow-up.  She is a very pleasant middle-  aged woman with paroxysmal atrial fibrillation and symptomatic  bradycardia despite medical therapy and has undergone permanent  pacemaker insertion several weeks ago.  The patient has, unfortunately,  developed a fairly large postoperative hematoma, which has been followed  conservatively but which has been stable.  She returns today for follow-  up.  Overall her hematoma has decreased in size and she is otherwise  stable.   Her medications include:  1. Tikosyn 250 mg twice a day.  2. Metoprolol 100 mg in the morning and 50 mg in the evening.  3. Potassium 20 mEq daily.  4. Multiple vitamins.  5. Aspirin 325 mg a day.  6. Simvastatin 80 mg daily.  7. Warfarin as directed.  8. Cozaar 50 mg daily.   PHYSICAL EXAM:  She is a pleasant, well-appearing middle-aged woman in  no distress.  Blood pressure was 180/100, the pulse 66 and regular, respirations were  18.  Weight was 222 pounds.  NECK:  No jugular distention.  LUNGS:  Clear bilaterally to auscultation.  No wheezes, rales or rhonchi  are present.  CARDIOVASCULAR:  A regular rate and rhythm.  Normal S1-S2.  EXTREMITIES:  No edema.  Her pacemaker pocket demonstrated a small to moderate-sized hematoma.  There is no tenderness or other issues with it.   IMPRESSION:  1. Atrial fibrillation.  2. Symptomatic bradycardia.  3. Status post pacemaker insertion complicated by postoperative      hematoma secondary to aspirin and Coumadin therapy.   DISCUSSION:  Overall, [name] is stable and she is healing up nicely  from her pacemaker implant and her hematoma is resolving.  We will plan  to see her back in the office in  2 months as previously scheduled.     Champ Mungo. Lovena Le, MD  Electronically Signed    GWT/MedQ  DD: 07/09/2007  DT: 07/10/2007  Job #: [id number]   cc:   Thayer Headings, M.D.

## 2010-11-15 NOTE — Op Note (Signed)
[name], [name]                  ACCOUNT NO.:  [id number]   MEDICAL RECORD NO.:  [id number]          PATIENT TYPE:  OIB   LOCATION:  2807                         FACILITY:  [facility]   PHYSICIAN:  Champ Mungo. Lovena Le, MD    DATE OF BIRTH:  [birth year]   DATE OF PROCEDURE:  06/13/2007  DATE OF DISCHARGE:                               OPERATIVE REPORT   PROCEDURE PERFORMED:  Implantation of a dual-chamber pacemaker.   INDICATIONS:  Symptomatic tachy-brady syndrome.   INTRODUCTION:  The patient is a very pleasant 65 year old woman who has  a history of paroxysmal atrial fibrillation for several years.  Her  symptoms increased in frequency and severity and we started Tikosyn  several months ago.  She has been maintained on beta blockers.  With her  episodes of atrial fibrillation she feels periods of dizziness and  lightheadedness, and she states that this occurs when it feels like her  heart is trying to go back into rhythm.  She subsequently wore a cardiac  monitor, which demonstrated post-termination pauses from her atrial  fibrillation of over 4 seconds.  She also had a symptomatic bradycardia  documented as well.  After discussion of the different treatment options  with regard to her atrial fibrillation including consideration for  catheter ablation as well as discontinuation of Tikosyn and trying  amiodarone, the patient has opted to proceed with permanent pacemaker  implantation.   PROCEDURE:  After informed consent was obtained, the patient taken to  the diagnostic EP lab in a fasting state.  After the usual preparation  and draping, intravenous fentanyl and midazolam were given for sedation.  Lidocaine 30 mL was infiltrated into the left infraclavicular region.  A  5-cm incision was carried out over this region and electrocautery was  utilized to dissect down to the fascial plane.  The left subclavian vein  was then punctured x2 and the St. Jude model 1788T, 52-cm active-  fixation  pacing lead, serial number [id number], was advanced into the  right ventricle, and the St. Jude model [id number] [phone], 46-cm active-fixation  pacing lead, serial number [id number], was advanced to the right atrium.  Mapping was carried out in the right ventricle and at the final site the  R-waves measured 10 mV and the pacing impedances were 688 in the atrium  and 953 in the ventricle.  The threshold was 0.8 V at 0.5 in the atrium  and 0.6 V at 0.5 in the ventricle.  There were nice injury currents on  both the atrium and the ventricle.  The P and R waves were 3 and 11,  respectively, in the atrium and ventricle.  With these satisfactory  parameters, the leads were secured to the subpectoralis fascia with a  figure-of-eight silk suture.  The sewing sleeve was secured with silk  suture.  Electrocautery was utilized to make a subcutaneous pocket.  Kanamycin irrigation was utilized to irrigate the pocket and  electrocautery was utilized to assure hemostasis.  The St. Jude Zephyr  XL dual-chamber pacemaker, serial number [id number], was connected to the  atrial and ventricular leads  and placed back in the subcutaneous pocket.  The generator was secured with silk suture.  Additional kanamycin was  then utilized to irrigate the pocket and the incision closed with a  layer of 2-0 Vicryl, followed by layer of 3-0 Vicryl.  Benzoin was  painted on the skin and Steri-Strips were applied and a pressure  dressing was placed and the patient was returned to her room in  satisfactory condition.   COMPLICATIONS:  There were no immediate procedure complications.   RESULTS:  This demonstrates successful implantation of a St. Jude dual-  chamber pacemaker in a patient with symptomatic tachy-brady syndrome.      Champ Mungo. Lovena Le, MD  Electronically Signed     GWT/MEDQ  D:  06/13/2007  T:  06/13/2007  Job:  [id number]   cc:   Derinda Late, M.D.  Thayer Headings, M.D.

## 2010-11-15 NOTE — Assessment & Plan Note (Signed)
[facility] OFFICE NOTE   NAME:CHEEKPhillina, Malinowski                         MRN:          [id number]  DATE:03/20/2007                            DOB:          10-Oct-1945    Ms. Punch returns today for followup of her atrial fibrillation. The  patient is a very pleasant, 65 year old woman with a history of coronary  artery disease, hypertension, obesity and A fib who has had increasingly  frequent episodes of A fib over the last several months. I saw her back  in April and at that time her A fib had increased in frequency but was  not severe and we elected to up titrate her beta blockers which had  initially been done but unfortunately in the last month or two her A fib  has increased in frequency and severity such that she now states that  she thinks that she is out of rhythm as much as she is in rhythm and  with this she become dizzy and light-headed and has near syncopal  episodes. She denies much in the way of shortness of breath except with  exertion when she is in A fib but also has some chest discomfort with  exertion in A fib. She denies peripheral edema. She unfortunately has  continued to gain weight over the last several months and years.   PHYSICAL EXAMINATION:  GENERAL:  She is very pleasant, middle-aged,  obese woman in no acute distress.  VITAL SIGNS:  Her blood pressure was 130/84, the pulse was 68 and  regular, the respiratory rate was 16. The weight was 219 pounds.  NECK:  Revealed no jugular venous distention.  LUNGS:  Clear bilaterally to auscultation. No wheezes, rales or rhonchi  were present.  CARDIOVASCULAR:  Regular rate and rhythm with normal S1 and S2.  ABDOMEN:  Soft, nontender, there was no organomegaly.  EXTREMITIES:  Demonstrated no cyanosis, clubbing or edema. The pulses  were 2+ and symmetric.   The EKG demonstrates sinus rhythm with left atrial enlargement and  nonspecific T wave  abnormality.   MEDICATIONS:  1. Potassium.  2. Cozaar 50 a day.  3. Warfarin as directed.  4. Simvastatin 80 a day.  5. Fluoxetine.  6. Aspirin 325 daily.  7. Plavix 75 a day.  8. Metoprolol 100 daily.   IMPRESSION:  1. Symptomatic atrial fibrillation.  2. Hypertension.  3. Coronary artery disease.   DISCUSSION:  The patient's A fib has worsened in severity and for this  reason I have recommended that she be admitted to the hospital and  undergo Tikosyn initiation. Her baseline QT is within limits for this  and will plan to do this in the next few days. Will obtain labs on  Monday and have her come in next Tuesday for Tikosyn initiation.     Champ Mungo. Lovena Le, MD  Electronically Signed    GWT/MedQ  DD: 03/20/2007  DT: 03/21/2007  Job #: [id number]   cc:   Derinda Late, M.D.

## 2010-11-15 NOTE — Procedures (Signed)
NAMEOSHAY, DOCKEN                  ACCOUNT NO.:  [id number]   MEDICAL RECORD NO.:  [id number]          PATIENT TYPE:  OUT   LOCATION:  SLEEP CENTER                 FACILITY[facility]   PHYSICIAN:  Kathee Delton, MD,FCCPDATE OF BIRTH:  12/30/1945   DATE OF STUDY:                            NOCTURNAL POLYSOMNOGRAM   REFERRING PHYSICIAN:  Kathee Delton, MD,FCCP   REFERRING PHYSICIAN:  Kathee Delton, MD, FCCP   INDICATION FOR STUDY:  Hypersomnia with sleep apnea.   EPWORTH SLEEPINESS SCORE:  15.   SLEEP ARCHITECTURE:  The patient had a total sleep time of 336 minutes  with small quantities of slow wave sleep, as well as REM.  Sleep onset  latency was normal at 27 minutes and REM latency was at the upper limits  of normal at 119 minutes.  Sleep efficiency was poor at 76%.   RESPIRATORY DATA:  The patient was found to have 8 apneas and 110  obstructive hypopneas, giving her an apnea/hypopnea index of 21 events  per hour.  The events were more frequent in the supine position, and  there was moderate-to-loud snoring noted throughout.   OXYGEN DATA:  There was O2 desaturation as low as 78% with the patient's  obstructive events.   CARDIAC DATA:  The patient had an occasional PAC and PVC, but no  clinically significant arrhythmias were seen.   MOVEMENT-PARASOMNIA:  The patient had no significant leg jerks or  abnormal behavior seen.   IMPRESSIONS-RECOMMENDATIONS:  1. Moderate obstructive sleep apnea/hypopnea syndrome with an AHI of      21 events per hour and oxygen desaturation as low as 78%.      Treatment for this degree of sleep apnea should focus primarily on      weight loss, as well as CPAP; however, consideration could also be      given to upper airway surgery and dental appliance.      Clinical correlation is suggested.  2. Occasional PAC and PVC, but no clinically significant arrhythmias      were seen.      Kathee Delton, MD,FCCP  Diplomate, [place] Board of  Sleep  Med[employer]  Electronically Signed     [facility]/MEDQ  D:  10/25/2008 15:45:18  T:  10/26/2008 01:19:56  Job:  [id number]

## 2010-11-15 NOTE — Assessment & Plan Note (Signed)
[facility] OFFICE NOTE   NAME:[name], [name]                         MRN:          [id number]  DATE:05/28/2007                            DOB:          [birth year]    SUBJECTIVE:  [name] returns today for followup.  She is a very  pleasant middle-aged woman, with a history of coronary artery disease  and paroxysmal atrial fibrillation, hypertension and dyslipidemia, who  returns today for followup.  She noted over the last several weeks that  she has had increasingly frequent episodes of atrial fibrillation.  Prior to starting Tikosyn, her episodes of atrial fibrillation would  last for several days and became increasingly frequent.  We placed her  on Tikosyn back in September, and since then her spells have been  better, but she has still had episodes typically lasting for several  hour at a time.  Occasionally she will feel dizzy.  She denies chest  pain.  She has no shortness of breath.  She has had no frank syncope.   MEDICATIONS:  1. Cozaar 50 mg daily.  2. Warfarin as directed.  3. Simvastatin 80 mg daily.  4. Fluoxetine.  5. Aspirin.  6. Plavix 75 mg daily.  7. Metoprolol 100 mg daily.  8. Potassium 10 mEq daily.  9. Tikosyn 250 mg twice daily.   PHYSICAL EXAMINATION:  GENERAL:  She is a pleasant well-appearing woman,  in no distress.  VITAL SIGNS:  Blood pressure 140/79, pulse 66 and regular, respirations  18, weight 221 pounds.  NECK:  No jugular venous distention.  LUNGS:  Clear bilaterally to auscultation.  No wheezes, rales or rhonchi  are present.  CARDIOVASCULAR:  A regular rate and rhythm.  Normal S1 and S2.  No  murmurs, rubs or gallops.  EXTREMITIES:  No edema.   IMPRESSION:  1. Paroxysmal atrial fibrillation with slight increasing frequency and      severity.  2. Hypertension.  3. Dyslipidemia.  4. Coronary artery disease.   DISCUSSION:  Overall [name] is stable, but I  have asked her today  after discussing the different possible treatment options, to up-titrate  her metoprolol to 100 mg every morning and 50 mg every evening.  She  will continue on Tikosyn 250 mg twice daily.   FOLLOWUP:  I will plan to see her back in the office in several months.     Champ Mungo. Lovena Le, MD  Electronically Signed    GWT/MedQ  DD: 05/28/2007  DT: 05/28/2007  Job #: [id number]   cc:   Derinda Late, M.D.

## 2010-11-15 NOTE — Assessment & Plan Note (Signed)
[facility] OFFICE NOTE   NAME:CHEEKMaripat, Osantowski                         MRN:          [id number]  DATE:09/16/2007                            DOB:          15-Jun-1946    [name] returns today for followup.  She is a very pleasant middle-age  woman with paroxysmal atrial fibrillation, coronary disease, and  symptomatic bradycardia who is status post pacemaker insertion back in  December.  She returns for followup.  Her initial procedure was  complicated by pocket hematoma which has resolved very nicely.  She is  otherwise very stable.   MEDICATIONS:  1. Tikosyn 250 twice a day.  2. Metoprolol 100 mg in the morning, 50 mg in the evening.  3. Potassium 20 mEq daily.  4. She was recently been on a Z-Pak by her primary physician.  5. She is also on warfarin as directed.  6. She is on Cozaar 50 a day.  7. Simvastatin 80 a day.  8. Multiple vitamins.  9. She is on aspirin 325 a day.   PHYSICAL EXAMINATION:  GENERAL:  The patient is a well-appearing middle-  age woman in no acute distress.  VITAL SIGNS:  Blood pressure 110/70. The pulse 64 regular. Respirations  were 18.  NECK:  Revealed no jugular venous distention.  LUNGS:  Clear bilaterally to auscultation.  No wheezes, rales or rhonchi  are present.  CARDIOVASCULAR:  Regular rate and rhythm.  Normal S1-S2.  EXTREMITIES:  Demonstrated no cyanosis, clubbing or edema.   Interrogation of her pacemaker demonstrates a Radio broadcast assistant.  P and R  waves were 2 and greater than 12, respectively.  The impedance 518 in  the atrium, 516 in the ventricle. Threshold 0.75 at 0.4 in the A and 0.5  at 0.4 in the V.  The battery voltage was 2.8 volts.  She was mode  switched and in atrial fibrillation approximately 2% a time.  There are  no rapid ventricular rates.   IMPRESSION:  1. Symptomatic tachycardia-bradycardia syndrome.  2. Status post pacemaker.  3. Coronary  disease.  4. Chronic Tikosyn therapy.  5. Recent diagnosis of pneumonia-bronchitis, and now she is on day #5      of azithromycin   DISCUSSION:  I have asked [name] to discontinue azithromycin.  There  is a small risk of drug interaction with this medication in terms of its  QT prolongation effects.  She is clearly well now, and she is not  clinically sick all with no evidence of pneumonia.  I will plan to see  the patient back in the office in several months.  Her pacemaker  function is normal.  It appears that  her symptoms of atrial fibrillation have resolved on the combination of  Tikosyn and backup pacing.     Champ Mungo. Lovena Le, MD  Electronically Signed    GWT/MedQ  DD: 09/16/2007  DT: 09/16/2007  Job #: [id number]   cc:   Thayer Headings, M.D.  Derinda Late, M.D.

## 2010-11-15 NOTE — Discharge Summary (Signed)
[name], [name]                  ACCOUNT NO.:  [id number]   MEDICAL RECORD NO.:  [id number]          PATIENT TYPE:  OIB   LOCATION:  2001                         FACILITY:  [facility]   PHYSICIAN:  [name], PA   DATE OF BIRTH:  09/09/1945   DATE OF ADMISSION:  06/13/2007  DATE OF DISCHARGE:  06/14/2007                               DISCHARGE SUMMARY   She has no known drug allergies.   Dictation time greater that 35 minutes.   FINAL DIAGNOSES:  1. Discharging day one status post implant of St. Jude Zephyr.  2. Symptomatic atrial fibrillation.      a.     Dizziness with bradycardia and with post atrial fibrillation       termination pauses (greater than 4 seconds at times).      b.     Tikosyn started September 2008.      c.     Chronic Coumadin therapy.   SECONDARY DIAGNOSES:  1. Hypertension.  2. Dyslipidemia.  3. History of inferior myocardial infarction.  4. The patient had stents placed in the proximal and mid right      coronary artery with stenting of the distal mid right coronary      artery stent for in-stent restenosis at left heart catheterization      June 2007.  Ejection fraction at that catheterization was 60%.   PROCEDURE PERFORMED:  June 13, 2007:  Implant of St. Jude Zephyr  dual-chamber pacemaker, Dr. Cristopher Peru.  The patient has had no  postprocedural complications.  No hematoma.  The device has been  interrogated.  All values are within normal limits.  The chest x-ray has  been examined.  There is no pneumothorax and the leads are in  appropriate position.  The lungs are clear.  The patient has some  soreness, which will be addressed with oral analgesic at the time of  discharge.  Her INR the day of discharge is 2.1.  The patient has been  on Coumadin throughout this procedure.   BRIEF HISTORY:  [name] is a 65 year old female.  She has coronary  artery disease, paroxysmal atrial fibrillation, hypertension and  dyslipidemia.  She has been  having dizzy spells and lightheaded spells.  She was placed on Tikosyn in September 2008 and her atrial fibrillation  episodes have been decreasing in frequency and severity.   The patient has worn a monitor since taking Tikosyn, and this  demonstrates pauses up to 4 seconds.  They are usually post- termination  pauses.  The patient has had no frank syncope.  Sometimes the patient  will have rates up to 140 beats per minute.  She has had several  episodes of near-syncope which last for several seconds before  resolving, and because of these symptoms she does not currently go out  and drive when she feels as if her heart is beating irregularly.  The  patient denies chest pain, shortness of breath.  The options are  several:  The patient could stop Tikosyn and start amiodarone.  The  second option would be catheter  ablation of atrial fibrillation.  The  third option would be pacemaker back-up with continuing her current  medical therapy.  The patient wishes to proceed with pacemaker  implantation to prevent bradycardia in the setting of paroxysmal atrial  fibrillation.   HOSPITAL COURSE:  The patient presented electively on December 11.  She  underwent pacemaker implantation of a St. Jude Zephyr dual-chamber  pacemaker by Dr. Cristopher Peru.  Has had no complications as mentioned  above.  She discharges with the admonition to keep her incision dry for  the next 7 days, to sponge bathe until Thursday, December 18.  Motion of  the left arm has also been described to the patient.   MEDICATION AT DISCHARGE:  1. Tikosyn 250 mcg twice daily.  2. Klor-Con 10 milliequivalents 2 tablets daily.  3. Metoprolol succinate 100 mg in the morning and 50 mg in the      evening.  4. Loratadine 10 mg daily.  5. Cozaar 50 mg daily.  6. Simvastatin 80 mg daily at bedtime.  7. Fluoxetine 20 mg daily.  8. Enteric-coated aspirin 325 mg daily.  9. Indapamide 2.5 mg daily.  10.Coumadin 3 mg Sunday and Friday  with 6 mg all other days for pain.  11.The patient has been given a prescription for Darvocet-N 100 one to      two tabs every four to six hours as needed.   She follows up at [facility], [address]  1. Coumadin Clinic Thursday, December 18 at 8:30.  2. Pacer clinic Wednesday, December 24 at 9:00.  3. To see Dr. Lovena Le Monday, September 16, 2007 at 11 o'clock.   LABORATORY DATA:  Laboratory studies pertinent to this admission:  Complete blood count drawn December 9:  White cells 9.5, hemoglobin  13.5, hematocrit 39.2 and platelets of 291.  Protime was 17.8, INR 2.0.  Sodium was 135, potassium 3.9, chloride 95, carbonate 32, glucose 93,  BUN is 13, creatinine 0.9.      [name], PA     GM/MEDQ  D:  06/14/2007  T:  06/14/2007  Job:  [id number]   cc:   Derinda Late, M.D.  Thayer Headings, M.D.  Champ Mungo. Lovena Le, MD

## 2010-11-18 NOTE — Op Note (Signed)
NAME:  [name], [name]                            ACCOUNT NO.:  [id number]   MEDICAL RECORD NO.:  [id number]                   PATIENT TYPE:  INP   LOCATION:  3028                                 FACILITY:  [facility]   PHYSICIAN:  [name] Mussel A. Pool, M.D.                 DATE OF BIRTH:  [birth year]   DATE OF PROCEDURE:  11/16/2003  DATE OF DISCHARGE:                                 OPERATIVE REPORT   PREOPERATIVE DIAGNOSIS:  L3-4 degenerative disk disease with central  herniated nucleus pulposus and stenosis.   POSTOPERATIVE DIAGNOSIS:  L3-4 degenerative disk disease with central  herniated nucleus pulposus and stenosis.   PROCEDURE NAME:  1. L3-4 decompressive laminectomy with bilateral L3-4 microdiskectomies.  2. L3-4 posterior lumbar interbody fusion utilizing Tangent wedges and local     autografting.  3. Re-exploration of L4-5 fusion with removal of hardware.  4. L3-4 posterolateral arthrodesis with pedicle screw instrumentation and     local autografting.   SURGEON:  Cooper Render. Pool, M.D.   ASSISTANT:  Kary Kos, M.D.   ANESTHESIA:  General endotracheal.   INDICATIONS:  [name] is a 65 year old woman status post previous L4-5  decompression and fusion for treatment of a degenerative spondylolisthesis.  The patient has done very well following surgery, however now presents with  worsening back and bilateral lower extremity pain failing all conservative  management.  Workup has demonstrated evidence of breakdown of the L3-4 disk  space above the level of the previous fusion with a large central disk  herniation and stenosis.  The patient has been counseled as to her options.  She has decided to proceed with L3-4 decompression and fusion with  instrumentation.  This will require exploring her old fusion to confirm  solid arthrodesis and removal of her old instrumentation.   OPERATIVE NOTE:  The patient was taken to the operating room and placed on  the operating table in supine  position.  After an adequate level of  anesthesia was achieved, the patient was positioned prone onto a Wilson  frame, appropriately padded.  The patient's lumbar region was prepped and  draped sterilely.  A 10 blade was used to make  a linear skin incision  overlying the L3, L4, L5 levels.  This was carried down sharply in the  midline.  Subperiosteal dissection was then performed exposing the laminae  and facet joints at L3, L4, and L5 as well as the transverse processes of  L3, L4, and L5 and the previously-placed instrumentation at L4-5.  The  instrumentation was removed using SDI instruments.  The fusion itself was  explored and found to be completely solid.  Attention was then placed to the  lamina of L3.  This was dissected free from the surrounding scar.  This was  then removed completely using Leksell rongeurs, Kerrison rongeurs, and the  high-speed drill, performing a complete laminectomy of L3,  complete inferior  facetectomies of L3, and complete superior facectomies of L4.  All bone was  cleaned and used in later autografting.  The ligamentum flavum and epidural  scar were elevated and dissected in a piecemeal fashion using Kerrison  rongeurs.  The underlying thecal sac and exiting L3 and L4 nerve roots were  identified.  Wide decompressive foraminotomies were then achieved along the  course of the exiting nerve roots.  Epidural venous plexus was coagulated  and cut.  Starting first at the patient's left side, the thecal sac and  nerve roots were mobilized and retracted toward the midline.  The disk space  was isolated.  A large central disk herniation was readily apparent.  The  disk space was then incised with a 15 blade in a rectangular fashion.  A  wide disk space clean out was then achieved using pituitary rongeurs and  upward-angled pituitary rongeurs and Epstein curettes.  All elements of the  disk herniation were completely resected.  All loose or obvious degenerative   disk material was removed from the interspace.  The procedure was then  repeated on the contralateral side.  Again no complications.  The disk space  was then sequentially dilated up to 10 mm with a 10 mm distractor left in  the patient's right side.  The thecal sac and nerve root were protected on  the left side.  The disk space was then reamed with a 10 mm Tangent box  cutter and then cut with the 10 mm Tangent chisel.  The soft tissue was  removed from the interspace.  A 10 x 26 mm Tangent wedge was then impacted  into place and recessed approximately 2 mm from the posterior cortical  margin.  The distractor was removed from the patient's right side.  The  thecal sac and nerve roots were then protected on the right side.  The disk  space once again was reamed and then cut with Tangent instruments.  The disk  space was further curettaged and morselized autograft was packed into the  interspace.  A second 10 x 26 mm Tangent wedge was then impacted into place  and recessed from the posterior cortical margin.  Pedicles of L3 were then  identified bilaterally.  The superficial bone overlying the pedicle was  removed using the high-speed drill.  Each pedicle was then probed using the  pedicle awl.  Each pedicle awl track was then tapped with a 5.25 mm screw  tap.  The screw tap hole was probed and found to be solidly within bone.  Spiral 90D 6.75 x 40 mm screws were placed bilaterally at L4 and 6.75 x 40  Spiral 90D screws were placed bilaterally at __________, replacing the  previous 5.75 x 40 mm screws.  All four screws were found to be well-  directed and solidly within bone.  The transverse processes of L3 and L4  were then decorticated using a high-speed drill.  Morselized autograft was  packed posterolaterally.  A short segment of titanium rod was then contoured  and placed over the screw heads at L3 and L4.  The locking caps were then placed over the screw heads.  The screw heads were  then given a final  tightening with the construct under compression.  Final images revealed good  position of bone grafts and hardware, proper operative level, and normal  alignment of the spine.  The wound was then irrigated with antibiotic  solution.  Gelfoam was placed topically for  hemostasis, which was found to  be good.  A medium Hemovac drain was left in the epidural space.  The wound  was then closed in layers with Vicryl suture.  Steri-Strips and sterile  dressing were applied.  There were no apparent complications.  The patient  tolerated the procedure well, and she returns to the recovery room postop.                                               Henry A. Pool, M.D.    HAP/MEDQ  D:  11/16/2003  T:  11/16/2003  Job:  [id number]

## 2010-11-18 NOTE — Assessment & Plan Note (Signed)
[facility] OFFICE NOTE   NAME:[name], Samantha NADOLNY                         MRN:          [id number]  DATE:10/19/2006                            DOB:          1945/07/24    [name] returns today for followup.  She is a very pleasant middle-  aged woman with the history of coronary artery disease, status post MI,  history of dyslipidemia, history of obesity, history of hypertension,  who has had a history of atrial fibrillation now (paroxysmal) for  several years.  I saw her last back in 2006 and, at that time, her A-fib  had been fairly quiescent.  She notes that she most recently went into A-  fib back on Sunday and stayed out of rhythm until this morning.  The  patient has had increasing doses of metoprolol from 50 to 100 to 150 mg  daily.  She is now on Toprol 150 daily, taken in divided doses.  She  notes that her Coumadin levels have been therapeutic.  She has no  specific complaints as we speak.   ON EXAM:  She is a pleasant, well-appearing, middle-aged woman, in no  acute distress.  Her blood pressure was 125/80, the pulse was 65 and  regular, respirations were 18, the weight was 216 pounds.  NECK:  Revealed no jugular venous distention, there was no thyromegaly.  LUNGS:  Clear bilaterally to auscultation.  There were no wheezes, rales  or rhonchi.  CARDIOVASCULAR EXAM:  Revealed a regular rate and rhythm with normal S1,  S2.  EXTREMITIES:  Demonstrated no edema.   EKG demonstrates sinus rhythm with normal axis and intervals.   IMPRESSION:  1. Paroxysmal atrial fibrillation.  2. Hypertension.  3. Dyslipidemia.  4. Coronary artery disease.   DISCUSSION:  Today, we spent considerable time discussing treatment  options for her A-fib.  I have recommended for now a period of rate  control with Toprol 150 mg daily.  If she develops recurrent A-fib, she  may not be particularly symptomatic on this increased  dose of Toprol.  If she is, then we would consider anti-arrhythmic drug therapy.  Hopefully, she will maintain sinus rhythm.  If she notices that her  heart rate goes real slow, she is instructed to call us.  She may  ultimately require back-up pacemaker, but for now, she is stable with  stable heart rate, on her present dose of beta blocker.     Champ Mungo. Lovena Le, MD  Electronically Signed    GWT/MedQ  DD: 10/19/2006  DT: 10/20/2006  Job #: [id number]   cc:   Thayer Headings, M.D.

## 2010-11-18 NOTE — Discharge Summary (Signed)
NAMECAMERIN, [name]                  ACCOUNT NO.:  [id number]   MEDICAL RECORD NO.:  [id number]          PATIENT TYPE:  INP   LOCATION:  [id number]                         FACILITY:  [facility]   PHYSICIAN:  Vladimir Faster, MD        DATE OF BIRTH:  Aug 03, 1945   DATE OF ADMISSION:  05/05/2006  DATE OF DISCHARGE:  05/11/2006                                 DISCHARGE SUMMARY   PRIMARY CARE PHYSICIAN:  Dr. Sandi Mariscal.   CONSULTATIONS:  Cardiology, Dr. Acie Fredrickson saw the patient.   PROCEDURES DONE IN THE HOSPITAL:  1. She had a portable chest done on May 08, 2006 which showed resolved      edema, improved left base atelectasis versus airspace disease.  2. She had an x-ray chest, 2 views done on May 06, 2006 which showed      improvement in interstitial pattern noted yesterday, most consistent      with improving interstitial edema.  3. And she had an x-ray chest done May 05, 2006 which shows diffuse      interstitial and basilar airspace disease, infection versus edema.   HISTORY OF PRESENT ILLNESS:  [name] is a 65 year old female with a  history of hypertension, coronary artery disease, and an MI in the past who  was admitted with shortness of breath.  She apparently went to her primary  care physician and was diagnosed with pneumonia and placed on antibiotic  therapy of azithromycin and oral inhaler.  She started getting worse and  came to the ER.  The initial x-ray chest done in the ER showed diffuse  interstitial edema and she was also hypoxemic while she came to the hospital  at 83% on room air.   PROBLEM LIST:  1. Dyspnea.  Her dyspnea is probably multi factorial.  She was hypoxemic      when she came, her x-ray chest showed possible diffuse interstitial and      bibasilar disease.  She was initially put on Lasix which helped her      breathing but she was still congested.  Dr. Acie Fredrickson was consulted, we      did serial enzymes which were negative, EKG was unremarkable.  Dr.  Acie Fredrickson recommended starting on a quick taper of Prednisone because he      suspected an upper respiratory infection might be causing her problems.      She also went into atrial fibrillation which could be secondary to her      viral disease.  After starting Prednisone, she is symptomatically much      better, she is breathing back in her baseline now.  We will continue      her short taper and will taper it over the next 4 days.  2. Atrial fibrillation.  She went into atrial fibrillation when she came      to the hospital.  Her heart rate was controlled with p.o. Cardizem and      then she switched back to sinus.  She has been in sinus rhythm for the  last couple of days.  She did have an echocardiogram a week prior to      the hospital admission which showed a normal LV function and some      diastolic dysfunction.  Since her breathing condition has improved, she      has not switched back into A-fib.  Dr. Acie Fredrickson believes she went into A-      fib because of an upper respiratory infection.  3. Pneumonia.  She was diagnosed with a pneumonia prior to admission.  She      was put on azithromycin for 2 days.  When she came to hospital, we put      her on Levaquin.  She has been getting Levaquin for the last 3 days, is      cleared for another 4 days to get a full week of antibiotic.  4. CHF.  She does have a diastolic dysfunction as per her echo done at Dr.      Elmarie Shiley office.  She was given Lasix for the first couple of days in      the hospital but since then, she became euvolemic and for the last 3      days, she has not been getting Lasix and she has been stable.  She will      not be put on Lasix at the time of discharge.   DISCHARGE MEDICATIONS:  1. Prednisone 20 mg once daily for 2 days and then 10 mg once daily for 2      days.  2. Levaquin 500 mg once daily for 4 more days.  3. Toprol-XL 100 mg once daily.  4. Aspirin 81 mg daily.  5. Plavix 75 mg daily.  6. Prozac 20 mg  daily.  7. Lisinopril 10 mg daily.  8. Cardizem LA 240 mg daily.  9. Vytorin 10/40 1 tablet daily.  10.Continue Coumadin as she used it prior to admission that is 5 mg on      Monday, Friday, Saturday, and Sunday, and 2.5 mg on Tuesday and      Wednesday.   DISPOSITION:  She will be discharged home in stable condition.  At the time  of discharge, her INR is 2.9.   FOLLOWUP:  She will followup with her primary care doctor, Dr. Sandi Mariscal,  with whom she has an appointment later this month and she will also followup  with her cardiologist, Dr. Acie Fredrickson, in 7-10 days.      Vladimir Faster, MD  Electronically Signed     PKN/MEDQ  D:  05/11/2006  T:  05/12/2006  Job:  [id number]   cc:   Derinda Late, M.D.  Thayer Headings, M.D.

## 2010-11-18 NOTE — H&P (Signed)
[name], [name]                  ACCOUNT NO.:  [id number]   MEDICAL RECORD NO.:  [id number]          PATIENT TYPE:  INP   LOCATION:  4738                         FACILITY:  [facility]   PHYSICIAN:  Jana Hakim, M.D. DATE OF BIRTH:  [birth year]   DATE OF ADMISSION:  05/05/2006  DATE OF DISCHARGE:                                HISTORY & PHYSICAL   CHIEF COMPLAINT:  Shortness of breath.   HISTORY OF PRESENT ILLNESS:  This is a 65 year old female who presented to  the Emergency Department secondary to complaints of worsening shortness of  breath, increased cough, and hemoptysis.  She reports having symptoms for  approximately 1 week of shortness of breath and cough and was seen by her  primary care physician 2 days ago, diagnosed with pneumonia and placed on  antibiotic therapy of azithromycin and an oral inhaler.  She began to have  hemoptysis the day of admission and worsening shortness of breath and  presented to the Emergency Department.  She reports having fevers off and on  and chills.  Denies having any nausea or vomiting.  Does report having some  chest discomfort.   PAST MEDICAL HISTORY:  1. Hypertension.  2. Coronary artery disease.  3. Previous MI.   MEDICATIONS GIVEN:  1. Nitroglycerin 0.4 mg sublingual p.r.n. chest pain.  2. Toprol XL 100 mg 1 p.o. every day.  3. Prozac 20 mg 1 p.o. every day.  4. Lisinopril 10 mg 1 p.o. every day.  5. Cardizem LA 240 mg 1 p.o. every day.  6. Vytorin 10/40 1 p.o. every day.  7. Coumadin, which was recently started, 5 mg 1 p.o. every Monday, Friday,      Saturday, Sunday, and 2.5 mg of Coumadin on Tuesday, Wednesday,      Thursday.   The patient also reports being on aspirin and Plavix therapy 75 mg 1 p.o.  every day.   ALLERGIES:  NO KNOWN DRUG ALLERGIES.   SOCIAL HISTORY:  The patient is married, a nonsmoker, and a social drinker.   REVIEW OF SYSTEMS:  Mentioned above.   PHYSICAL EXAMINATION:  GENERAL:  A pleasant  65 year old well-nourished, well-  developed female in no discomfort or acute distress currently.  VITAL SIGNS:  Temperature 99.1, blood pressure 154/81, heart rate 78,  respirations 20, and O2 saturation initially on presentation, 83%, currently  91% on 2 liters of nasal cannula oxygen.  HEENT:  Normocephalic, atraumatic.  Pupils equally round and reactive to  light.  Extraocular muscles are intact.  There was no scleral icterus.  Funduscopic exam benign.  Oropharynx is clear.  NECK:  Supple.  Full range of motion.  No thyromegaly, adenopathy, or  jugular venous distention.  CARDIOVASCULAR:  Regular rate and rhythm.  No murmurs, gallops, or rubs.  LUNGS:  Clear to auscultation.  There are no rales, rhonchi, or wheezes.  ABDOMEN:  Positive bowel sounds.  Soft, nontender, nondistended.  No  hepatosplenomegaly.  EXTREMITIES:  Without cyanosis, clubbing, or edema.  GENITOURINARY:  Deferred.  RECTAL:  Deferred.  NEUROLOGIC EXAMINATION:  The patient is alert and oriented  x3.  There are no  focal deficits.   LABORATORY STUDIES:  Initial cardiac enzymes reveal myoglobin 96.9, a CK MB  2.1, troponin less than 0.05.  White blood cell count 13.1, hemoglobin 12.4,  hematocrit 35.8, platelets 261.  Neutrophils 81%, lymphocytes 10%, sodium  123, potassium 4.3, chloride 89, carbon dioxide 26, BUN 11, creatinine 0.6,  glucose 161.  Albumin 3.3, total bilirubin 0.7, AST 60, ALT 101, alkaline  phosphatase 126.  Digoxin level less than 0.2.  Beta natriuretic peptide  321.  Urinalysis negative except for trace leukocyte esterase.  Chest x-ray  findings revealed diffuse interstitial and basilar interspace disease  infection versus pulmonary edema.   ASSESSMENT:  A 65 year old female with worsening shortness of breath and  hypoxia being admitted with a primary diagnosis of:  1. Hypoxemia.  2. Congestive heart failure.  3. Pneumonia.  4. Hyponatremia.  5. Mild urinary tract infection.  6. Hypertension  with elevated blood pressure.   PLAN:  The patient will be admitted to a telemetry area for cardiac  monitoring.  Cardiac enzymes will be performed every 8 hours x 3.  The  patient will be placed on antibiotic therapy of Avalox orally and will be  placed on diuretic therapy IV.  Albuterol nebulizer treatments have been  ordered and a stat protime, INR, and D-dimer have also been ordered.  The  patient is on Coumadin therapy; however, may not be therapeutic at this  time.      Jana Hakim, M.D.  Electronically Signed     HJ/MEDQ  D:  05/06/2006  T:  05/06/2006  Job:  [id number]

## 2010-11-18 NOTE — Cardiovascular Report (Signed)
NAME:  [name], [name]                            ACCOUNT NO.:  [id number]   MEDICAL RECORD NO.:  [id number]                   PATIENT TYPE:  OIB   LOCATION:  NA                                   FACILITY:  [facility]   PHYSICIAN:  Thayer Headings, M.D.              DATE OF BIRTH:  October 07, 1945   DATE OF PROCEDURE:  06/12/2003  DATE OF DISCHARGE:                              CARDIAC CATHETERIZATION   HISTORY:  [name] is a 65 year old female with a history of coronary  artery disease.  She is status post inferior wall myocardial infarction with  PTCA and stenting of her right coronary artery.  She presented to the office  recently with episodes of chest pain and ST segment depression in the  inferior leads.  She is referred for heart catheterization for further  evaluation.   PROCEDURE:  Left heart catheterization with coronary angiography.  The right  femoral artery was easily cannulated using a modified Seldinger technique.   HEMODYNAMICS:  The left ventricular pressure is 167/17 with an aortic  pressure of 169/82.   ANGIOGRAPHY:  The left main coronary artery is smooth and normal.   The left anterior descending artery is relatively smooth and normal.  There  is a large first diagonal branch which is smooth and normal.  The distal LAD  is somewhat tortuous.   The left circumflex artery basically supplies a large obtuse marginal  artery.  This branch is smooth and normal.   The right coronary artery is large and dominant.  The two stents in the mid  right coronary artery are patent.  There are mild to moderate irregularities  within these stents ranging from 20-30%.  There is no obstruction of flow.  There is a small RV marginal branch that originates within the stented  segments that can be seen filling left to right collaterals.  There is also  some very slow right to right collaterals.  This may be the cause of her  symptoms and EKG changes.   LEFT VENTRICULOGRAM:  The left  ventriculogram was performed in a 30 RAO  position.  It reveals normal left ventricular systolic function.  The  ejection fraction is approximately 60%.  There is no mitral regurgitation.   COMPLICATIONS:  None.   CONCLUSION:  Mild to moderate coronary artery disease.  Her right coronary  artery stents have mild to moderate irregularities, but no real obstruction  to flow.  She does have an occlusion of a very small RV marginal branch.  This may be the cause of her symptoms as well as her EKG changes.  This  vessel is small and we will just treat it medically.  It is less than 1 mm  in size.  Her left ventricular systolic function is normal.  Thayer Headings, M.D.    PJN/MEDQ  D:  06/12/2003  T:  06/12/2003  Job:  [id number]   cc:   Derinda Late, M.D.  [place] [address]  Fax: [phone]

## 2010-11-18 NOTE — Cardiovascular Report (Signed)
[name], [name]                  ACCOUNT NO.:  [id number]   MEDICAL RECORD NO.:  [id number]          PATIENT TYPE:  INP   LOCATION:  2901                         FACILITY:  [facility]   PHYSICIAN:  [place] OF BIRTH:  1945-10-03   DATE OF PROCEDURE:  DATE OF DISCHARGE:                              CARDIAC CATHETERIZATION   REFERRING PHYSICIAN:  Dr. Peter Minium.   PRIMARY CARDIOLOGIST:  Dr. Mertie Moores.   PRIMARY PHYSICIAN:  Dr. Derinda Late.   PROCEDURES PERFORMED:  Left heart catheterization, coronary angiogram, left  ventriculogram, PCI of the right coronary artery.   OPERATOR:  Dr. Irish Lack.   INDICATIONS:  Unstable angina along with sinus pauses.   PROCEDURAL NARRATIVE:  The risks and benefits of cardiac catheterization  were explained to the patient and informed consent obtained.  A 6-French  arterial sheath was placed in the right femoral artery using the modified  Seldinger technique.  Left coronary artery angiography was performed using a  JL-4.0 catheter.  The catheter was advanced in the ascending aorta and into  the ostium of the left main under fluoroscopic guidance.  Digital  angiography was performed and multiple projections using hand injection  contrast.  Right coronary artery angiography was then performed using a JR-  4.0 catheter.  The catheter was advanced to the vessel ostium under  fluoroscopic guidance.  Digital angiography was performed in multiple  projections using hand injection of contrast.  The PCI of the right coronary  artery was then performed.  Please see below for details.  Left  ventriculogram was then performed.  A pigtail catheter was advanced to the  ascending aorta and across the aortic valve under fluoroscopic guidance.  Hemodynamic pressure assessment was performed.  The ventriculogram was  performed in the 30 degree RAO position using a power injection of contrast.  A pullback was performed under continuous hemodynamic  pressure monitoring.  The 6-French right femoral arterial sheath was removed and a Star close was  deployed for hemostasis.  This was used because the patient was having  significant back pain.  She has had multiple back surgeries.   HEMODYNAMICS:  Left ventricular pressure:  145/8 with an LVEDP of 17.  Aortic pressure:  149/72 with a mean aortic pressure of 105.   CORONARY ANGIOGRAPHY FINDINGS:  The left main was a normal-size artery and  was widely patent.  The circumflex was a medium-size vessel with minor  irregularities.  The OM-1 was a medium-to-large-size vessel with also minor  irregularities.  The remainder of the circumflex was small.  The left  anterior descending was a medium-size vessel with minor irregularities  throughout.  The first diagonal was medium size and appeared  angiographically normal.  The right coronary artery was a large dominant  vessel.  The stent in the proximal vessel was widely patent.  There was a  second stent which had been placed in 1998 in the mid RCA.  This stent had  moderate diffuse restenosis throughout.  At the distal edge of the stent  there appeared to be a 70% area of stenosis.  Just proximal to that, there  was a 50% stenosis.  The lesion at the stent edge was somewhat hazy.  There  was TIMI III flow throughout this vessel.   PCI NARRATIVE:  JR-4.0 guiding catheter was advanced to the ostium of the  right coronary artery.  The lesion was crossed with a Prowater wire.  A 3.0  x 23 mm Cypher stent was advanced across the lesion and inflated to 18  atmospheres for 37 seconds.  The entire stent was then post dilated with a  3.5 x 15 mm Quantum Maverick balloon inflated to 18 atmospheres for 26  seconds in the proximal portion of the stent and then 16 atmospheres for 23  seconds.  A slight indentation was still noted in the distal portion of the  recently placed stent.  An additional post dilatation was performed at 18  atmospheres for 14  seconds.  There is an excellent angiographic result with  TIMI III flow and a 0% residual stenosis.   IMPRESSION:  1.  Single-vessel coronary artery disease from in-stent restenosis of a      prior bare metal stent involving the right coronary artery.  2.  Successful DES implantation in the mid right coronary artery with a 3.0      x 23 mm Cypher stent which was post dilated to 3.7 mm in diameter.  3.  Normal left ventricular function with estimated ejection fraction of      60%.   RECOMMENDATIONS:  The patient will receive Integrilin for 18 hours and will  continue aspirin 325 mg p.o. daily and Plavix 75 mg p.o. daily.  indefinitely.  Her rhythm will be monitored on telemetry.  We will not use  any beta blockade at this time because of her positive greater than 3  seconds earlier today.  We will also hold her digoxin.  Continue Vytorin.      [name], MD  Electronically Signed     JSV/MEDQ  D:  12/02/2005  T:  12/03/2005  Job:  [id number]   cc:   Druscilla Brownie, MD  [place], [place] [address]   Thayer Headings, M.D.  Fax: [phone]   Derinda Late, M.D.  Fax: [phone]

## 2010-11-18 NOTE — H&P (Signed)
NAMEAVISHKA, STATLER                  ACCOUNT NO.:  [id number]   MEDICAL RECORD NO.:  [id number]          PATIENT TYPE:  INP   LOCATION:  1827                         FACILITY:  [facility]   PHYSICIAN:  [place] OF BIRTH:  1946/01/04   DATE OF ADMISSION:  12/02/2005  DATE OF DISCHARGE:                                HISTORY & PHYSICAL   REFERRING PHYSICIAN:  Dr. Peter Minium   PRIMARY CARDIOLOGIST:  Dr. Acie Fredrickson   REASON FOR ADMISSION:  Chest tightness and palpitations.   HISTORY OF PRESENT ILLNESS:  The patient is a 65 year old woman with a known  history of paroxysmal atrial fibrillation and coronary artery disease.  She  awoke this morning with palpitations.  She is currently treated with Toprol-  XL.  She has been given propranolol to take p.r.n. when she feels that her  heart has gone out of rhythm.  She took a 10 mg propranolol tablet.  She  then had tightness in her chest.  She took some sublingual nitroglycerin  which did not really help the pain.  She came to the emergency room and was  found to be back in a sinus bradycardia.  However, she was having  significant sinus pauses including one pause of greater than 3 seconds.  The  chest pain has improved significantly since being in the emergency room.  She has been hemodynamically stable.  She was lightheaded while at home for  a short period of time; she never passed out.  Currently she is feeling  fairly well.   PAST MEDICAL HISTORY:  1.  Hypertension.  2.  Coronary artery disease status post MI.  3.  Borderline diabetes.   PAST SURGICAL HISTORY:  1.  Hysterectomy.  2.  Cholecystectomy.  3.  Back surgery x2.   ALLERGIES:  No known drug allergies.   MEDICATIONS:  1.  Plavix 75 mg daily.  2.  Fluoxetine 20 mg daily.  3.  Lanoxin 0.25 mg daily.  4.  Chlorthalidone 12.5 mg daily.  5.  Toprol-XL 100 mg daily.  6.  Lisinopril 10 mg daily.  7.  Vytorin 10/40 mg daily.  8.  Aspirin 81 mg daily.   FAMILY  HISTORY:  Brother died of an MI at age 69.   SOCIAL HISTORY:  The patient quit tobacco in 1984.  She occasionally drinks  alcohol.  She is retired.  She lives with her husband.   REVIEW OF SYSTEMS:  No fevers or chills.  She does report chest tightness  and lightheadedness as described above.  No syncope, no nausea or vomiting,  no diarrhea, no dysuria.  All other systems negative.   PHYSICAL EXAMINATION:  VITAL SIGNS:  Heart rate of 58, blood pressure  130/70, respirations 16.  GENERAL:  The patient is awake and alert, in no apparent distress.  HEENT:  Head:  Normocephalic, atraumatic.  Eyes:  Extraocular movements  intact.  NECK:  No JVD.  CARDIOVASCULAR:  S1, S2, bradycardic.  LUNGS:  Clear to auscultation bilaterally.  ABDOMEN:  Soft, nontender, nondistended.  EXTREMITIES:  Showed no edema, 2+ right  femoral pulse, 2+ right dorsalis  pedis pulse.  NEUROLOGIC:  No focal motor or sensory deficits.   Creatinine 1.1.  Troponin less than 0.05.  Hematocrit of 43.  EKG shows  sinus bradycardia.  There are sinus pauses, the longest of which is 3.4  seconds.  There are 1-mm ST-segment depressions diffusely which may  represent a digoxin effect.   ASSESSMENT:  A 65 year old with likely tachybrady syndrome along with a  history of coronary artery disease.   PLAN:  1.  Cardiac:  Will hold all rate-slowing drugs for now.  Will watch her on      telemetry in the [facility] [facility].  The patient may ultimately need a pacemaker if      her sinus pauses or symptomatic tachycardia persist while off her beta      blocker and digoxin.  2.  Will check digoxin level.  3.  Rule out MI with enzymes.  The chest tightness seems to be rate related.      We will use heparin and nitroglycerin for now.  If she continues to have      chest tightness with her nonspecific ST-segment changes, we may have to      perform cardiac catheterization emergently.  4.  Continue Vytorin.  5.  Continue aspirin for now.  Given the  fact she may need a pacemaker, will      hold Plavix.  She has already taken today's dose regardless.  6.  Will also hold her other blood pressures medicines until her heart rate      stabilizes.      [name], MD  Electronically Signed     JSV/MEDQ  D:  12/02/2005  T:  12/02/2005  Job:  [id number]

## 2010-11-18 NOTE — Discharge Summary (Signed)
[name], [name]                  ACCOUNT NO.:  [id number]   MEDICAL RECORD NO.:  [id number]          PATIENT TYPE:  INP   LOCATION:  [id number]                         FACILITY:  [facility]   PHYSICIAN:  Thayer Headings, M.D. DATE OF BIRTH:  [birth year]   DATE OF ADMISSION:  12/02/2005  DATE OF DISCHARGE:  12/03/2005                                 DISCHARGE SUMMARY   DISCHARGE DIAGNOSES:  1.  Coronary artery disease - status post percutaneous transluminal coronary      angioplasty and stenting of the right coronary artery.  2.  History of atrial fibrillation in the past.  3.  Dyslipidemia.  4.  Hypertension.  5.  Borderline diabetes mellitus.   DISCHARGE MEDICATIONS:  1.  Aspirin 325 mg a day.  2.  Plavix 75 mg a day.  3.  Nitroglycerin 0.4 mg sublingual.  4.  Vytorin 10/40 mg once a day.  5.  Prozac 20 mg a day.  6.  Chlorthalidone 12.5 mg a day.   DISPOSITION:  The patient will see Dr. Acie Fredrickson in several weeks for a  followup.   HISTORY:  Ms. Mbaye is a 65 year old female who presented to hospital with  episodes of chest pain.  Please see dictated H&P for further details.   HOSPITAL COURSE BY PROBLEMS:  1.  Chest pain:  Patient has significant chest pains.  _____________ chest      pain ____________ taken originally to the cath lab by Dr.      ________________ .  She was found to have a moderately tight right      coronary artery and underwent PTCA stenting of the right coronary artery      using a 3.0 x 23 mm CY PER stent inflated up to 18 atmospheres.  It was      post dilated using a 3.5 x 15 mm Quantum Monorail inflated up to 18      atmospheres as well.  Patient tolerated the procedure quite well.  She      was discharged ____________in good condition.  All of her other medical      problems remained stable.           ______________________________  Thayer Headings, M.D.     PJN/MEDQ  D:  01/24/2006  T:  01/24/2006  Job:  [id number]   cc:   Derinda Late, M.D.  Fax:  [phone]

## 2010-12-02 ENCOUNTER — Telehealth: Payer: Self-pay | Admitting: *Deleted

## 2010-12-02 MED ORDER — FLUOXETINE HCL 20 MG PO CAPS: 20.0000 mg | ORAL_CAPSULE | Freq: Every day | ORAL | Status: DC

## 2010-12-02 MED ORDER — POTASSIUM CHLORIDE CRYS ER 20 MEQ PO TBCR: 20.0000 meq | EXTENDED_RELEASE_TABLET | Freq: Every day | ORAL | Status: DC

## 2010-12-02 MED ORDER — INDAPAMIDE 2.5 MG PO TABS: ORAL_TABLET | ORAL | Status: DC

## 2010-12-02 NOTE — Telephone Encounter (Signed)
rx sent to pharmacy

## 2010-12-09 ENCOUNTER — Encounter: Payer: Self-pay | Admitting: Internal Medicine

## 2011-01-20 ENCOUNTER — Encounter: Payer: Self-pay | Admitting: Internal Medicine

## 2011-01-20 DIAGNOSIS — I495 Sick sinus syndrome: Secondary | ICD-10-CM

## 2011-02-02 ENCOUNTER — Telehealth: Payer: Self-pay | Admitting: *Deleted

## 2011-02-02 ENCOUNTER — Other Ambulatory Visit: Payer: Self-pay | Admitting: *Deleted

## 2011-02-02 MED ORDER — INDAPAMIDE 2.5 MG PO TABS: ORAL_TABLET | ORAL | Status: DC

## 2011-02-02 MED ORDER — ATORVASTATIN CALCIUM 40 MG PO TABS: 40.0000 mg | ORAL_TABLET | Freq: Every day | ORAL | Status: DC

## 2011-02-02 MED ORDER — POTASSIUM CHLORIDE CRYS ER 20 MEQ PO TBCR: 20.0000 meq | EXTENDED_RELEASE_TABLET | Freq: Every day | ORAL | Status: DC

## 2011-02-02 MED ORDER — LOSARTAN POTASSIUM 100 MG PO TABS: 100.0000 mg | ORAL_TABLET | Freq: Every day | ORAL | Status: DC

## 2011-02-02 MED ORDER — FLUOXETINE HCL 20 MG PO CAPS: 20.0000 mg | ORAL_CAPSULE | Freq: Every day | ORAL | Status: DC

## 2011-02-02 MED ORDER — DABIGATRAN ETEXILATE MESYLATE 150 MG PO CAPS: 150.0000 mg | ORAL_CAPSULE | Freq: Two times a day (BID) | ORAL | Status: DC

## 2011-02-02 NOTE — Telephone Encounter (Signed)
Rx sent to pharmacy   

## 2011-02-02 NOTE — Telephone Encounter (Signed)
Fax received from pharmacy. Refill completed. Jodette Asal Teas RN  

## 2011-02-06 ENCOUNTER — Other Ambulatory Visit: Payer: Self-pay | Admitting: *Deleted

## 2011-02-06 MED ORDER — METOPROLOL SUCCINATE ER 100 MG PO TB24: ORAL_TABLET | ORAL | Status: DC

## 2011-02-06 MED ORDER — DOFETILIDE 250 MCG PO CAPS: 250.0000 ug | ORAL_CAPSULE | Freq: Two times a day (BID) | ORAL | Status: DC

## 2011-02-07 ENCOUNTER — Telehealth: Payer: Self-pay | Admitting: Internal Medicine

## 2011-02-07 MED ORDER — METOPROLOL SUCCINATE ER 100 MG PO TB24: ORAL_TABLET | ORAL | Status: DC

## 2011-02-07 MED ORDER — DOFETILIDE 250 MCG PO CAPS: 250.0000 ug | ORAL_CAPSULE | Freq: Two times a day (BID) | ORAL | Status: DC

## 2011-02-07 NOTE — Telephone Encounter (Signed)
Needs new prescriptions called into [facility]mail order.  [phone]  She needs Tikosyn, and Metoprolol.  Please call this in as soon as possible.

## 2011-04-07 LAB — PROTIME-INR
INR: 2.1 — ABNORMAL HIGH (ref 0.00–1.49)
INR: 2.2 — ABNORMAL HIGH (ref 0.00–1.49)
INR: 2.5 — ABNORMAL HIGH (ref 0.00–1.49)
Prothrombin Time: 25 seconds — ABNORMAL HIGH (ref 11.6–15.2)
Prothrombin Time: 26.1 seconds — ABNORMAL HIGH (ref 11.6–15.2)
Prothrombin Time: 28.8 seconds — ABNORMAL HIGH (ref 11.6–15.2)

## 2011-04-07 LAB — DIFFERENTIAL
Basophils Absolute: 0.1 10*3/uL (ref 0.0–0.1)
Basophils Relative: 1 % (ref 0–1)
Eosinophils Absolute: 0.3 10*3/uL (ref 0.0–0.7)
Eosinophils Relative: 3 % (ref 0–5)
Lymphocytes Relative: 34 % (ref 12–46)
Lymphs Abs: 2.9 10*3/uL (ref 0.7–4.0)
Monocytes Absolute: 0.8 10*3/uL (ref 0.1–1.0)
Monocytes Relative: 9 % (ref 3–12)
Neutro Abs: 4.6 10*3/uL (ref 1.7–7.7)
Neutrophils Relative %: 53 % (ref 43–77)

## 2011-04-07 LAB — CBC
HCT: 39.5 % (ref 36.0–46.0)
HCT: 41.9 % (ref 36.0–46.0)
Hemoglobin: 13.3 g/dL (ref 12.0–15.0)
Hemoglobin: 14.1 g/dL (ref 12.0–15.0)
MCHC: 33.6 g/dL (ref 30.0–36.0)
MCHC: 33.7 g/dL (ref 30.0–36.0)
MCV: 93 fL (ref 78.0–100.0)
MCV: 93.6 fL (ref 78.0–100.0)
Platelets: 267 10*3/uL (ref 150–400)
Platelets: 275 10*3/uL (ref 150–400)
RBC: 4.22 MIL/uL (ref 3.87–5.11)
RBC: 4.51 MIL/uL (ref 3.87–5.11)
RDW: 14.2 % (ref 11.5–15.5)
RDW: 14.3 % (ref 11.5–15.5)
WBC: 8.7 10*3/uL (ref 4.0–10.5)
WBC: 9.1 10*3/uL (ref 4.0–10.5)

## 2011-04-07 LAB — HEMOGLOBIN A1C
Hgb A1c MFr Bld: 6.3 % — ABNORMAL HIGH (ref 4.6–6.1)
Mean Plasma Glucose: 134 mg/dL

## 2011-04-07 LAB — COMPREHENSIVE METABOLIC PANEL
ALT: 21 U/L (ref 0–35)
AST: 20 U/L (ref 0–37)
Albumin: 3.7 g/dL (ref 3.5–5.2)
Alkaline Phosphatase: 68 U/L (ref 39–117)
BUN: 12 mg/dL (ref 6–23)
CO2: 30 mEq/L (ref 19–32)
Calcium: 8.9 mg/dL (ref 8.4–10.5)
Chloride: 96 mEq/L (ref 96–112)
Creatinine, Ser: 0.72 mg/dL (ref 0.4–1.2)
GFR calc Af Amer: >60 mL/min (ref >60)
GFR calc non Af Amer: >60 mL/min (ref >60)
Glucose, Bld: 125 mg/dL — ABNORMAL HIGH (ref 70–99)
Potassium: 4.2 mEq/L (ref 3.5–5.1)
Sodium: 132 mEq/L — ABNORMAL LOW (ref 135–145)
Total Bilirubin: 0.5 mg/dL (ref 0.3–1.2)
Total Protein: 6.5 g/dL (ref 6.0–8.3)

## 2011-04-07 LAB — POCT I-STAT, CHEM 8
BUN: 15 mg/dL (ref 6–23)
Calcium, Ion: 1.14 mmol/L (ref 1.12–1.32)
Chloride: 99 mEq/L (ref 96–112)
Creatinine, Ser: 1.1 mg/dL (ref 0.4–1.2)
Glucose, Bld: 104 mg/dL — ABNORMAL HIGH (ref 70–99)
HCT: 44 % (ref 36.0–46.0)
Hemoglobin: 15 g/dL (ref 12.0–15.0)
Potassium: 4.4 mEq/L (ref 3.5–5.1)
Sodium: 134 mEq/L — ABNORMAL LOW (ref 135–145)
TCO2: 29 mmol/L (ref 0–100)

## 2011-04-07 LAB — APTT
aPTT: 35 seconds (ref 24–37)
aPTT: 39 seconds — ABNORMAL HIGH (ref 24–37)

## 2011-04-07 LAB — URINALYSIS, ROUTINE W REFLEX MICROSCOPIC
Bilirubin Urine: NEGATIVE
Glucose, UA: NEGATIVE mg/dL
Hgb urine dipstick: NEGATIVE
Ketones, ur: NEGATIVE mg/dL
Nitrite: NEGATIVE
Protein, ur: NEGATIVE mg/dL
Specific Gravity, Urine: 1.009 (ref 1.005–1.030)
Urobilinogen, UA: 0.2 mg/dL (ref 0.0–1.0)
pH: 7 (ref 5.0–8.0)

## 2011-04-10 LAB — PROTIME-INR
INR: 2.1 — ABNORMAL HIGH
Prothrombin Time: 24.5 — ABNORMAL HIGH

## 2011-04-13 LAB — BASIC METABOLIC PANEL
BUN: 15
BUN: 15
BUN: 16
BUN: 16
BUN: 17
BUN: 19
CO2: 26
CO2: 27
CO2: 28
CO2: 28
CO2: 30
CO2: 30
Calcium: 9.1
Calcium: 9.1
Calcium: 9.2
Calcium: 9.3
Calcium: 9.5
Calcium: 9.5
Chloride: 100
Chloride: 97
Chloride: 97
Chloride: 99
Chloride: 99
Chloride: 99
Creatinine, Ser: 0.87
Creatinine, Ser: 0.88
Creatinine, Ser: 0.9
Creatinine, Ser: 0.92
Creatinine, Ser: 0.92
Creatinine, Ser: 0.93
GFR calc Af Amer: >60
GFR calc Af Amer: >60
GFR calc Af Amer: >60
GFR calc Af Amer: >60
GFR calc Af Amer: >60
GFR calc Af Amer: >60
GFR calc non Af Amer: >60
GFR calc non Af Amer: >60
GFR calc non Af Amer: >60
GFR calc non Af Amer: >60
GFR calc non Af Amer: >60
GFR calc non Af Amer: >60
Glucose, Bld: 104 — ABNORMAL HIGH
Glucose, Bld: 104 — ABNORMAL HIGH
Glucose, Bld: 105 — ABNORMAL HIGH
Glucose, Bld: 107 — ABNORMAL HIGH
Glucose, Bld: 95
Glucose, Bld: 98
Potassium: 3.8
Potassium: 3.8
Potassium: 4.1
Potassium: 4.2
Potassium: 4.6
Potassium: 4.6
Sodium: 132 — ABNORMAL LOW
Sodium: 133 — ABNORMAL LOW
Sodium: 134 — ABNORMAL LOW
Sodium: 136
Sodium: 137
Sodium: 137

## 2011-04-13 LAB — PROTIME-INR
INR: 2.1 — ABNORMAL HIGH
INR: 2.1 — ABNORMAL HIGH
INR: 2.1 — ABNORMAL HIGH
INR: 2.2 — ABNORMAL HIGH
INR: 2.3 — ABNORMAL HIGH
Prothrombin Time: 24.1 — ABNORMAL HIGH
Prothrombin Time: 24.1 — ABNORMAL HIGH
Prothrombin Time: 24.6 — ABNORMAL HIGH
Prothrombin Time: 25.2 — ABNORMAL HIGH
Prothrombin Time: 26 — ABNORMAL HIGH

## 2011-04-13 LAB — CBC
HCT: 39.5
Hemoglobin: 13.6
MCHC: 34.4
MCV: 91.3
Platelets: 349
RBC: 4.32
RDW: 13.8
WBC: 8.9

## 2011-04-13 LAB — MAGNESIUM: Magnesium: 2.4

## 2011-04-18 ENCOUNTER — Ambulatory Visit (INDEPENDENT_AMBULATORY_CARE_PROVIDER_SITE_OTHER): Payer: Medicare Other | Admitting: Internal Medicine

## 2011-04-18 ENCOUNTER — Encounter: Payer: Self-pay | Admitting: Internal Medicine

## 2011-04-18 DIAGNOSIS — E785 Hyperlipidemia, unspecified: Secondary | ICD-10-CM

## 2011-04-18 DIAGNOSIS — R7309 Other abnormal glucose: Secondary | ICD-10-CM

## 2011-04-18 DIAGNOSIS — I10 Essential (primary) hypertension: Secondary | ICD-10-CM

## 2011-04-18 DIAGNOSIS — Z7901 Long term (current) use of anticoagulants: Secondary | ICD-10-CM

## 2011-04-18 DIAGNOSIS — Z8659 Personal history of other mental and behavioral disorders: Secondary | ICD-10-CM

## 2011-04-18 DIAGNOSIS — Z23 Encounter for immunization: Secondary | ICD-10-CM

## 2011-04-18 DIAGNOSIS — E669 Obesity, unspecified: Secondary | ICD-10-CM

## 2011-04-18 DIAGNOSIS — I2510 Atherosclerotic heart disease of native coronary artery without angina pectoris: Secondary | ICD-10-CM

## 2011-04-18 DIAGNOSIS — I4891 Unspecified atrial fibrillation: Secondary | ICD-10-CM

## 2011-04-18 DIAGNOSIS — Z0000 Encounter for general adult medical examination without abnormal findings: Secondary | ICD-10-CM

## 2011-04-18 LAB — LIPID PANEL
Cholesterol: 152 mg/dL (ref 0–200)
HDL: 61.2 mg/dL (ref >39.00)
LDL Cholesterol: 60 mg/dL (ref 0–99)
Total CHOL/HDL Ratio: 2
Triglycerides: 153 mg/dL — ABNORMAL HIGH (ref 0.0–149.0)
VLDL: 30.6 mg/dL (ref 0.0–40.0)

## 2011-04-18 LAB — CBC WITH DIFFERENTIAL/PLATELET
Basophils Absolute: 0.1 10*3/uL (ref 0.0–0.1)
Basophils Relative: 0.7 % (ref 0.0–3.0)
Eosinophils Absolute: 0.4 10*3/uL (ref 0.0–0.7)
Eosinophils Relative: 4.4 % (ref 0.0–5.0)
HCT: 42.9 % (ref 36.0–46.0)
Hemoglobin: 14.3 g/dL (ref 12.0–15.0)
Lymphocytes Relative: 27.7 % (ref 12.0–46.0)
Lymphs Abs: 2.7 10*3/uL (ref 0.7–4.0)
MCHC: 33.2 g/dL (ref 30.0–36.0)
MCV: 96.5 fl (ref 78.0–100.0)
Monocytes Absolute: 0.8 10*3/uL (ref 0.1–1.0)
Monocytes Relative: 7.9 % (ref 3.0–12.0)
Neutro Abs: 5.8 10*3/uL (ref 1.4–7.7)
Neutrophils Relative %: 59.3 % (ref 43.0–77.0)
Platelets: 298 10*3/uL (ref 150.0–400.0)
RBC: 4.45 Mil/uL (ref 3.87–5.11)
RDW: 13.4 % (ref 11.5–14.6)
WBC: 9.8 10*3/uL (ref 4.5–10.5)

## 2011-04-18 LAB — BASIC METABOLIC PANEL
BUN: 19 mg/dL (ref 6–23)
CO2: 29 mEq/L (ref 19–32)
Calcium: 9.7 mg/dL (ref 8.4–10.5)
Chloride: 99 mEq/L (ref 96–112)
Creatinine, Ser: 1 mg/dL (ref 0.4–1.2)
GFR: 62.71 mL/min (ref >60.00)
Glucose, Bld: 109 mg/dL — ABNORMAL HIGH (ref 70–99)
Potassium: 5 mEq/L (ref 3.5–5.1)
Sodium: 137 mEq/L (ref 135–145)

## 2011-04-18 LAB — HEPATIC FUNCTION PANEL
ALT: 26 U/L (ref 0–35)
AST: 24 U/L (ref 0–37)
Albumin: 4.2 g/dL (ref 3.5–5.2)
Alkaline Phosphatase: 83 U/L (ref 39–117)
Bilirubin, Direct: 0 mg/dL (ref 0.0–0.3)
Total Bilirubin: 0.6 mg/dL (ref 0.3–1.2)
Total Protein: 7.8 g/dL (ref 6.0–8.3)

## 2011-04-18 LAB — HEMOGLOBIN A1C: Hgb A1c MFr Bld: 6.4 % (ref 4.6–6.5)

## 2011-04-18 LAB — TSH: TSH: 1.95 u[IU]/mL (ref 0.35–5.50)

## 2011-04-18 MED ORDER — POTASSIUM CHLORIDE CRYS ER 20 MEQ PO TBCR: 20.0000 meq | EXTENDED_RELEASE_TABLET | Freq: Every day | ORAL | Status: DC

## 2011-04-18 MED ORDER — ATORVASTATIN CALCIUM 40 MG PO TABS: 40.0000 mg | ORAL_TABLET | Freq: Every day | ORAL | Status: DC

## 2011-04-18 MED ORDER — FLUOXETINE HCL 20 MG PO CAPS: 20.0000 mg | ORAL_CAPSULE | Freq: Every day | ORAL | Status: DC

## 2011-04-18 NOTE — Assessment & Plan Note (Signed)
Doing well on med prozac without se .The current medical regimen is effective;  continue present plan and medications.

## 2011-04-18 NOTE — Progress Notes (Signed)
Subjective:    Patient ID: [name], female    DOB: 1946/02/03, 65 y.o.   MRN: [id number]  HPI Patient comes in today for preventive visit and follow-up of medical issues. Welcome to medicare  Update of her history since her last visit. thinks she is doing quite well.  NO hosp or injury  . CAD  AF stable and no change in meds  Has pacer  MOOD: in prozac and  Feels well wishes to stay on med no  side effects of med LIPIDS: no side effects of lipitor .     Hearing:  Ok   Vision:  No limitations at present .  Safety:  Has smoke detector and wears seat belts.  No firearms. No excess sun exposure. Sees dentist regularly.  Falls:  None   Advance directive :  Reviewed  Not written.  Memory: Felt to be good  , no concern from her or her family.  Depression: No anhedonia unusual crying or depressive symptoms  Nutrition: Eats well balanced diet; adequate calcium and vitamin D. No swallowing chewiing problems.  Injury: no major injuries in the last six months.  Other healthcare providers:  Reviewed today .  Social:  Lives with husband married. No pets.   Preventive parameters: up-to-date on colonoscopy, mammogram, immunizations. Including Tdap and pneumovax.  ADLS:   There are no problems or need for assistance  driving, feeding, obtaining food, dressing, toileting and bathing, managing money using phone. She is independent.    Review of Systems ROS:  GEN/ HEENTNo fever, significant weight changes sweats headaches vision problems hearing changes, CV/ PULM; No chest pain shortness of breath cough, syncope,edema  change in exercise tolerance. GI /GU: No adominal pain, vomiting, change in bowel habits. No blood in the stool. No significant GU symptoms. SKIN/HEME: ,no acute skin rashes suspicious lesions or bleeding. No lymphadenopathy, nodules, masses.  NEURO/ PSYCH:  No neurologic signs such as weakness numbness No depression anxiety. IMM/ Allergy: No unusual infections.   Allergy .   REST of 12 system review negative  Past history family history social history reviewed in the electronic medical record.  Busy with family an active socially    Objective:   Physical Exam Physical Exam: Vital signs reviewed [id number] is a well-developed well-nourished alert cooperative  white female who appears her stated age in no acute distress.  HEENT: normocephalic  traumatic , Eyes: PERRL EOM's full, conjunctiva clear, Nares: paten,t no deformity discharge or tenderness., Ears: no deformity EAC's clear TMs with normal landmarks. Mouth: clear OP, no lesions, edema.  Moist mucous membranes. Dentition in adequate repair. NECK: supple without masses, thyromegaly or bruits. CHEST/PULM:  Clear to auscultation and percussion breath sounds equal no wheeze , rales or rhonchi. No chest wall deformities or tenderness. CV: PMI is nondisplaced, S1 S2 no gallops, murmurs, rubs. Peripheral pulses are full without delay.No JVD .  Breast: normal by inspection . No dimpling, discharge, masses, tenderness or discharge .  ABDOMEN: Bowel sounds normal nontender  No guard or rebound, no hepato splenomegal no CVA tenderness.  No hernia. Extremtities:  No clubbing cyanosis or edema, no acute joint swelling or redness no focal atrophy NEURO:  Oriented x3, cranial nerves 3-12 appear to be intact, no obvious focal weakness,gait within normal limits SKIN: No acute rashes normal turgor, color, no bruising or petechiae. PSYCH: Oriented, good eye contact, no obvious depression anxiety, cognition and judgment appear normal.     Assessment & Plan:  Preventive Health Care Counseled regarding  healthy nutrition, exercise, sleep, injury prevention, calcium vit d and healthy weight . Pneumovax and flu shot today Recommended immunizations discussed and explained. Questions answered.  HT  Stable   Refill meds  LIPIDS continue rx meds  Hyperglycemia  To check to day   Counseled.  anticoagulation  Paf. No  bleeding Hx pacer   Medicare Attestation I have personally reviewed: The patient's medical and social history Their use of alcohol, tobacco or illicit drugs Their current medications and supplements The patient's functional ability including ADLs,fall risks, home safety risks, cognitive, and hearing and visual impairment Diet and physical activities Evidence for depression or mood disorders  The patient's weight, height, BMI, and visual acuity have been recorded in the chart.  I have made referrals, counseling, and provided education to the patient based on review of the above and I have provided the patient with a written personalized care plan for preventive services.

## 2011-04-18 NOTE — Patient Instructions (Signed)
Continue lifestyle intervention healthy eating and exercise . No change in meds  ROV in 6 months for med check

## 2011-04-18 NOTE — Assessment & Plan Note (Signed)
Controlled on meds

## 2011-04-19 ENCOUNTER — Encounter: Payer: Self-pay | Admitting: *Deleted

## 2011-04-20 DIAGNOSIS — Z79899 Other long term (current) drug therapy: Secondary | ICD-10-CM | POA: Insufficient documentation

## 2011-04-21 ENCOUNTER — Encounter: Payer: Self-pay | Admitting: Internal Medicine

## 2011-04-21 DIAGNOSIS — I495 Sick sinus syndrome: Secondary | ICD-10-CM

## 2011-05-18 ENCOUNTER — Encounter: Payer: Self-pay | Admitting: *Deleted

## 2011-05-19 ENCOUNTER — Ambulatory Visit (INDEPENDENT_AMBULATORY_CARE_PROVIDER_SITE_OTHER): Payer: Medicare Other | Admitting: Cardiovascular Disease

## 2011-05-19 ENCOUNTER — Encounter: Payer: Self-pay | Admitting: Cardiovascular Disease

## 2011-05-19 DIAGNOSIS — I4891 Unspecified atrial fibrillation: Secondary | ICD-10-CM

## 2011-05-19 DIAGNOSIS — I2510 Atherosclerotic heart disease of native coronary artery without angina pectoris: Secondary | ICD-10-CM

## 2011-05-19 DIAGNOSIS — R079 Chest pain, unspecified: Secondary | ICD-10-CM

## 2011-05-19 MED ORDER — NITROGLYCERIN 0.4 MG SL SUBL: 0.4000 mg | SUBLINGUAL_TABLET | SUBLINGUAL | Status: DC | PRN

## 2011-05-19 NOTE — Progress Notes (Signed)
[name] Date of Birth  February 27, 1946 [facility] [address]    [place], [place]  [address] [phone]  Fax  [phone]  History of Present Illness:  Of is a 65 year old female with a history of atrial fibrillation. She status post pacemaker implantation. She also has a history of coronary artery disease and is status post PTCA and stenting of the right coronary artery. She also has a history of hyperlipidemia, hypertension, and sleep apnea.  She uses a CPAP mask at night.  The CPAP mask has really helped her stay in normal sinus rhythm.  She complains of having some episodes of chest tightness. This occurs at times. One episode was relieved with sublingual nitroglycerin. She states that it is not nearly as severe as her previous episodes of angina.  Current Outpatient Prescriptions on File Prior to Visit  Medication Sig Dispense Refill  . aspirin 325 MG tablet 325 mg. Take one tablet by mouth once daily.       [name] atorvastatin (LIPITOR) 40 MG tablet Take 1 tablet (40 mg total) by mouth daily.  90 tablet  3  . dabigatran (PRADAXA) 150 MG CAPS Take 1 capsule (150 mg total) by mouth every 12 (twelve) hours.  180 capsule  1  . dofetilide (TIKOSYN) 250 MCG capsule Take 1 capsule (250 mcg total) by mouth 2 (two) times daily.  180 capsule  3  . FLUoxetine (PROZAC) 20 MG capsule Take 1 capsule (20 mg total) by mouth daily. One capsule every morning  90 capsule  3  . indapamide (LOZOL) 2.5 MG tablet Take 1/2 tablet every AM  45 tablet  0  . ketoconazole (NIZORAL) 2 % cream Apply to rash two times a day for up to 2 weeks       . losartan (COZAAR) 100 MG tablet Take 1 tablet (100 mg total) by mouth daily.  90 tablet  1  . Lysine HCl 1000 MG TABS as needed.        . metoprolol (TOPROL-XL) 100 MG 24 hr tablet Take one tablet in the morning and 1/2 tablet in the evening  135 tablet  3  . Multiple Vitamin (MULTIVITAMIN) tablet Take 1 tablet by mouth daily.        . NON FORMULARY  Calcium carbonate powder Take one tablet by mouth once daily.       . NON FORMULARY at bedtime. cpap       . potassium chloride SA (K-DUR,KLOR-CON) 20 MEQ tablet Take 1 tablet (20 mEq total) by mouth daily. One every AM  90 tablet  3  . valACYclovir (VALTREX) 1000 MG tablet Take 2 by mouth and repeat in 12 hours for cold sores       . VITAMIN D PO Take 1,000 mg by mouth daily.          No Known Allergies  Past Medical History  Diagnosis Date  . History of CVA (cerebrovascular accident)   . CAD (coronary artery disease) [id number]    post PTCA with bare-metal stenting to mid RCA in December 2004     . Hypertension   . Depression   . Chronic atrial fibrillation 06/2007    Tachybradycardia pacemaker  . Pacemaker   . OSA on CPAP   . Anticoagulant long-term use     pradaxa  . Transfusion history   . Hyperlipidemia   . Edema of lower extremity   . History of acute inferior wall MI   . Anxiety   .  Dyslipidemia     Past Surgical History  Procedure Date  . Total abdominal hysterectomy 1984    2 surgeries endometriosis bso  . Permanent pacemaker 06/2007  . Back surgeries     x2-3 by Dr Trenton Gammon  . Coronary stent placement     C stent  . Cholecystectomy 1984  . Coronary angioplasty with stent placement 1998    History  Smoking status  . Former Smoker -- 1.0 packs/day for 5 years  . Types: Cigarettes  . Quit date: 07/03/1982  Smokeless tobacco  . Not on file    History  Alcohol Use  . Yes    once q 6 months-socailly    Family History  Problem Relation Age of Onset  . Suicidality Father     suicide death pt was 3 yrs  . Arrhythmia Mother     heart arrthymia  . Hypertension Mother   . Heart attack Brother     died age 33  . Heart disease Paternal Aunt   . Cancer Maternal Grandfather     prostate    Reviw of Systems:  Reviewed in the HPI.  All other systems are negative.  Physical Exam: BP 151/85  Pulse 64  Ht 5' 6.5" (1.689 m)  Wt 235 lb 1.9 oz (106.65  kg)  BMI 37.38 kg/m2 The patient is alert and oriented x 3.  The mood and affect are normal.   Skin: warm and dry.  Color is normal.    HEENT:   She has normal carotids. There is no JVD. Her mucous membranes are moist.  Lungs: Lung exam is clear.   Heart: Regular rate S1-S2.    Abdomen: Her abdomen is soft. She has good bowel sounds.  Extremities:  No clubbing cyanosis or edema.  Neuro:  Her gait is normal. Her neuro exam is nonfocal.    ECG: Normal sinus rhythm. She has nonspecific ST and T wave abnormalities.  Assessment / Plan:

## 2011-05-19 NOTE — Assessment & Plan Note (Signed)
She has a history of atrial fibrillation. Her atrial fibrillation has been much better controlled since she has been adequately treating her sleep apnea.

## 2011-05-19 NOTE — Assessment & Plan Note (Signed)
She presents today for followup visit. She reports having some episodes of chest pain/chest tightness. She's had angioplasty in the past. Her last stress Myoview study was 6 years ago. I might repeat the study as a Tony study.  Assuming that the study is normal I'll see her in 6 months.

## 2011-05-19 NOTE — Patient Instructions (Signed)
Your physician wants you to follow-up in: 6 MONTHS You will receive a reminder letter in the mail two months in advance. If you don't receive a letter, please call our office to schedule the follow-up appointment.  Your physician has requested that you have a lexiscan myoview. For further information please visit [email or web address]. Please follow instruction sheet, as given.

## 2011-05-30 ENCOUNTER — Ambulatory Visit (HOSPITAL_COMMUNITY): Payer: Medicare Other | Attending: Cardiovascular Disease | Admitting: Radiology

## 2011-05-30 DIAGNOSIS — E119 Type 2 diabetes mellitus without complications: Secondary | ICD-10-CM | POA: Insufficient documentation

## 2011-05-30 DIAGNOSIS — I2510 Atherosclerotic heart disease of native coronary artery without angina pectoris: Secondary | ICD-10-CM

## 2011-05-30 DIAGNOSIS — E669 Obesity, unspecified: Secondary | ICD-10-CM | POA: Insufficient documentation

## 2011-05-30 DIAGNOSIS — Z8249 Family history of ischemic heart disease and other diseases of the circulatory system: Secondary | ICD-10-CM | POA: Insufficient documentation

## 2011-05-30 DIAGNOSIS — I252 Old myocardial infarction: Secondary | ICD-10-CM | POA: Insufficient documentation

## 2011-05-30 DIAGNOSIS — E785 Hyperlipidemia, unspecified: Secondary | ICD-10-CM | POA: Insufficient documentation

## 2011-05-30 DIAGNOSIS — Z87891 Personal history of nicotine dependence: Secondary | ICD-10-CM | POA: Insufficient documentation

## 2011-05-30 DIAGNOSIS — R0789 Other chest pain: Secondary | ICD-10-CM | POA: Insufficient documentation

## 2011-05-30 DIAGNOSIS — Z950 Presence of cardiac pacemaker: Secondary | ICD-10-CM | POA: Insufficient documentation

## 2011-05-30 DIAGNOSIS — Z8679 Personal history of other diseases of the circulatory system: Secondary | ICD-10-CM | POA: Insufficient documentation

## 2011-05-30 DIAGNOSIS — R079 Chest pain, unspecified: Secondary | ICD-10-CM

## 2011-05-30 MED ORDER — REGADENOSON 0.4 MG/5ML IV SOLN
0.4000 mg | Freq: Once | INTRAVENOUS | Status: AC
  Administered 2011-05-30: 0.4 mg via INTRAVENOUS

## 2011-05-30 MED ORDER — TECHNETIUM TC 99M TETROFOSMIN IV KIT
33.0000 | PACK | Freq: Once | INTRAVENOUS | Status: AC | PRN
  Administered 2011-05-30: 33 via INTRAVENOUS

## 2011-05-30 MED ORDER — TECHNETIUM TC 99M TETROFOSMIN IV KIT
11.0000 | PACK | Freq: Once | INTRAVENOUS | Status: AC | PRN
  Administered 2011-05-30: 11 via INTRAVENOUS

## 2011-05-30 NOTE — Progress Notes (Signed)
[place] [address] [phone]  Cardiology Nuclear Med Study  BAYLY YANTZ is a 65 y.o. female [name] 1946-04-17   Nuclear Med Background Indication for Stress Test:  Evaluation for Ischemia and Stent Patency History: 12/09 Echo: NL, 06/07 Heart Catheterization: EF 60% RCA in-stent restenosis, 1998 Myocardial Infarction: inferior, 01/06 Myocardial Perfusion Study: EF 67%, '08 Pacemaker and 06/07 Stents: RCA Cardiac Risk Factors: CVA, Family History - CAD, History of Smoking, Lipids, NIDDM and Obesity  Symptoms:  Chest Tightness    Nuclear Pre-Procedure Caffeine/Decaff Intake:  None NPO After: 8:00pm   Lungs:  clear IV 0.9% NS with Angio Cath:  20g  IV Site: R Hand  IV Started by:  Eliezer Lofts, EMT-P  Chest Size (in):  38 Cup Size: B  Height: 5' 6.5" (1.689 m)  Weight:  238 lb (107.956 kg)  BMI:  Body mass index is 37.84 kg/(m^2). Tech Comments:  Toprol held this am, per patient    Nuclear Med Study 1 or 2 day study: 1 day  Stress Test Type:  Carlton Adam  Reading MD: Kirk Ruths, MD  Order Authorizing Provider:  P.Nahser  Resting Radionuclide: Technetium 37m Tetrofosmin  Resting Radionuclide Dose: 11.0 mCi   Stress Radionuclide:  Technetium 71m Tetrofosmin  Stress Radionuclide Dose: 33.0 mCi           Stress Protocol Rest HR: 58 Stress HR: 78  Rest BP: 142/85 Stress BP: 149/91  Exercise Time (min): n/a METS: n/a   Predicted Max HR: 155 bpm % Max HR: 50.32 bpm Rate Pressure Product: 11622   Dose of Adenosine (mg):  n/a Dose of Lexiscan: 0.4 mg  Dose of Atropine (mg): n/a Dose of Dobutamine: n/a mcg/kg/min (at max HR)  Stress Test Technologist: Perrin Maltese, EMT-P  Nuclear Technologist:  Annye Rusk, CNMT     Rest Procedure:  Myocardial perfusion imaging was performed at rest 45 minutes following the intravenous administration of Technetium 91m Tetrofosmin. Rest ECG: Sinus Bradycardia  Stress  Procedure:  The patient received IV Lexiscan 0.4 mg over 15-seconds.  Technetium 37m Tetrofosmin injected at 30-seconds.  There were no significant changes and sob with Lexiscan.  Quantitative spect images were obtained after a 45 minute delay. Stress ECG: No significant ST segment change suggestive of ischemia.  QPS Raw Data Images:  Acquisition technically good; normal left ventricular size. Stress Images:  Normal homogeneous uptake in all areas of the myocardium. Rest Images:  Normal homogeneous uptake in all areas of the myocardium. Subtraction (SDS):  No evidence of ischemia. Transient Ischemic Dilatation (Normal <1.22):  1.0 Lung/Heart Ratio (Normal <0.45):  0.31  Quantitative Gated Spect Images QGS EDV:  82 ml QGS ESV:  19 ml QGS cine images:  NL LV Function; NL Wall Motion QGS EF: 77%  Impression Exercise Capacity:  Lexiscan with no exercise. BP Response:  Normal blood pressure response. Clinical Symptoms:  No chest pain. ECG Impression:  No significant ST segment change suggestive of ischemia. Comparison with Prior Nuclear Study: No images to compare  Overall Impression:  Normal stress nuclear study.   Kirk Ruths

## 2011-06-02 ENCOUNTER — Telehealth: Payer: Self-pay | Admitting: Cardiovascular Disease

## 2011-06-02 NOTE — Telephone Encounter (Signed)
Spoke with pt, aware of stress test results [name]

## 2011-06-02 NOTE — Telephone Encounter (Signed)
New message:  Pt is returning a call to [place].

## 2011-06-08 ENCOUNTER — Other Ambulatory Visit: Payer: Self-pay | Admitting: Internal Medicine

## 2011-07-03 ENCOUNTER — Ambulatory Visit (INDEPENDENT_AMBULATORY_CARE_PROVIDER_SITE_OTHER): Payer: Medicare Other | Admitting: Pulmonary Disease

## 2011-07-03 ENCOUNTER — Encounter: Payer: Self-pay | Admitting: Pulmonary Disease

## 2011-07-03 VITALS — BP 110/80 | HR 65 | Temp 98.2°F | Ht 66.0 in | Wt 240.4 lb

## 2011-07-03 DIAGNOSIS — G4733 Obstructive sleep apnea (adult) (pediatric): Secondary | ICD-10-CM

## 2011-07-03 NOTE — Progress Notes (Signed)
  Subjective:    Patient ID: [name], female    DOB: 11/02/1945, 65 y.o.   MRN: [id number]  HPI Patient comes in today for followup of her known obstructive sleep apnea.  She been wearing CPAP compliantly, and is having no issues with her device.  She has recently gotten a new CPAP mask and supplies.  She feels that she sleeps well with the CPAP, and denies any issues with daytime alertness.  She has most recently had issues with awakening between 3 and 4 AM, and having difficulties getting to sleep.  I have discussed with her behavioral issues that may help with this.   Review of Systems  Constitutional: Negative.  Negative for fever and unexpected weight change.  HENT: Positive for congestion, sneezing and postnasal drip. Negative for ear pain, nosebleeds, sore throat, rhinorrhea, trouble swallowing, dental problem and sinus pressure.   Eyes: Positive for itching. Negative for redness.  Respiratory: Positive for cough. Negative for chest tightness, shortness of breath and wheezing.   Cardiovascular: Negative.  Negative for palpitations and leg swelling.  Gastrointestinal: Negative.  Negative for nausea and vomiting.  Genitourinary: Negative.  Negative for dysuria.  Musculoskeletal: Negative.  Negative for joint swelling.  Skin: Negative.  Negative for rash.  Neurological: Negative.  Negative for headaches.  Hematological: Negative.  Does not bruise/bleed easily.  Psychiatric/Behavioral: Negative.  Negative for dysphoric mood. The patient is not nervous/anxious.        Objective:   Physical Exam Obese female in no acute distress No skin breakdown or pressure necrosis from the CPAP mask Lower extremities with mild edema, no cyanosis noted Alert and oriented ,and does not appear to be sleepy, moves all 4 extremities.       Assessment & Plan:

## 2011-07-03 NOTE — Patient Instructions (Signed)
Continue with cpap, keep up with mask changes and supplies Work on weight loss Do not stay in bed more than 64min if you cannot re-initiate sleep.  Try the behavioral method we discussed to help with this.  followup with me in one year.

## 2011-07-25 ENCOUNTER — Encounter: Payer: Self-pay | Admitting: Internal Medicine

## 2011-07-25 ENCOUNTER — Ambulatory Visit (INDEPENDENT_AMBULATORY_CARE_PROVIDER_SITE_OTHER): Payer: Medicare Other | Admitting: Internal Medicine

## 2011-07-25 DIAGNOSIS — I2510 Atherosclerotic heart disease of native coronary artery without angina pectoris: Secondary | ICD-10-CM

## 2011-07-25 DIAGNOSIS — Z950 Presence of cardiac pacemaker: Secondary | ICD-10-CM

## 2011-07-25 DIAGNOSIS — I4891 Unspecified atrial fibrillation: Secondary | ICD-10-CM

## 2011-07-25 LAB — PACEMAKER DEVICE OBSERVATION
AL AMPLITUDE: 1.2 mv
AL IMPEDENCE PM: 447 Ohm
AL THRESHOLD: 0.5 V
ATRIAL PACING PM: 1
BAMS-0001: 150 {beats}/min
BAMS-0003: 70 {beats}/min
BATTERY VOLTAGE: 2.82 V
DEVICE MODEL PM: 1999089
RV LEAD AMPLITUDE: 12 mv
RV LEAD IMPEDENCE PM: 392 Ohm
RV LEAD THRESHOLD: 1 V
VENTRICULAR PACING PM: 1

## 2011-07-25 NOTE — Assessment & Plan Note (Signed)
Interrogation of the pacemaker demonstrates normal pacemaker function. We'll plan a recheck in several months.

## 2011-07-25 NOTE — Assessment & Plan Note (Signed)
She currently denies anginal symptoms. She will continue her current medical therapy. I have encouraged the patient to increase her physical activity.

## 2011-07-25 NOTE — Patient Instructions (Signed)
Your physician wants you to follow-up in: 12 months with Dr Knox Saliva will receive a reminder letter in the mail two months in advance. If you don't receive a letter, please call our office to schedule the follow-up appointment.   [employer] every 3 months

## 2011-07-25 NOTE — Progress Notes (Signed)
HPI [name] returns today for followup. She is a very pleasant middle-aged woman with coronary artery disease, status post stenting, hypertension, paroxysmal atrial fibrillation, symptomatic bradycardia, status post permanent pacemaker insertion. The patient denies chest pain, shortness of breath, or peripheral edema. She has very minimal palpitations. She has been fairly sedentary and has had trouble losing weight. No Known Allergies   Current Outpatient Prescriptions  Medication Sig Dispense Refill  . aspirin 325 MG tablet 325 mg. Take one tablet by mouth once daily.       [name] atorvastatin (LIPITOR) 40 MG tablet Take 1 tablet (40 mg total) by mouth daily.  90 tablet  3  . dabigatran (PRADAXA) 150 MG CAPS Take 1 capsule (150 mg total) by mouth every 12 (twelve) hours.  180 capsule  1  . dofetilide (TIKOSYN) 250 MCG capsule Take 1 capsule (250 mcg total) by mouth 2 (two) times daily.  180 capsule  3  . FLUoxetine (PROZAC) 20 MG capsule Take 1 capsule (20 mg total) by mouth daily. One capsule every morning  90 capsule  3  . indapamide (LOZOL) 2.5 MG tablet TAKE 1/2 TABLET EVERY      MORNING  45 tablet  0  . losartan (COZAAR) 100 MG tablet Take 1 tablet (100 mg total) by mouth daily.  90 tablet  1  . Lysine HCl 1000 MG TABS as needed.        . metoprolol (TOPROL-XL) 100 MG 24 hr tablet Take one tablet in the morning and 1/2 tablet in the evening  135 tablet  3  . Multiple Vitamin (MULTIVITAMIN) tablet Take 1 tablet by mouth daily.        . nitroGLYCERIN (NITROSTAT) 0.4 MG SL tablet Place 1 tablet (0.4 mg total) under the tongue every 5 (five) minutes as needed for chest pain.  25 tablet  3  . NON FORMULARY Calcium carbonate powder Take one tablet by mouth once daily.       . NON FORMULARY at bedtime. cpap       . potassium chloride SA (K-DUR,KLOR-CON) 20 MEQ tablet Take 1 tablet (20 mEq total) by mouth daily. One every AM  90 tablet  3  . valACYclovir (VALTREX) 1000 MG tablet Take 2 by mouth and  repeat in 12 hours for cold sores       . vitamin B-12 (CYANOCOBALAMIN) 100 MCG tablet Take 50 mcg by mouth daily.      [name] VITAMIN D PO Take 1,000 mg by mouth daily.        . vitamin E 1000 UNIT capsule Take 1,000 Units by mouth daily.         Past Medical History  Diagnosis Date  . History of CVA (cerebrovascular accident)   . CAD (coronary artery disease) [id number]    post PTCA with bare-metal stenting to mid RCA in December 2004     . Hypertension   . Depression   . Chronic atrial fibrillation 06/2007    Tachybradycardia pacemaker  . Pacemaker   . OSA on CPAP   . Anticoagulant long-term use     pradaxa  . Transfusion history   . Hyperlipidemia   . Edema of lower extremity   . History of acute inferior wall MI   . Anxiety   . Dyslipidemia     ROS:   All systems reviewed and negative except as noted in the HPI.   Past Surgical History  Procedure Date  . Total abdominal hysterectomy 1984  2 surgeries endometriosis bso  . Permanent pacemaker 06/2007  . Back surgeries     x2-3 by Dr Trenton Gammon  . Coronary stent placement     C stent  . Cholecystectomy 1984  . Coronary angioplasty with stent placement 1998  . Doppler echocardiography 2009     Family History  Problem Relation Age of Onset  . Suicidality Father     suicide death pt was 3 yrs  . Arrhythmia Mother     heart arrthymia  . Hypertension Mother   . Heart attack Brother     died age 72  . Heart disease Paternal Aunt   . Cancer Maternal Grandfather     prostate     History   Social History  . Marital Status: Married    Spouse Name: N/A    Number of Children: 0  . Years of Education: HS   Occupational History  . retired     previously worked [employer]  . Smoking status: Former Smoker -- 1.0 packs/day for 5 years    Types: Cigarettes    Quit date: 07/03/1982  . Smokeless tobacco: Not on file  . Alcohol Use: Yes     once q 6 months-socailly  . Drug Use: No    . Sexually Active: Not on file   Other Topics Concern  . 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 HEART:  Regular rate rhythm, no murmurs, no rubs, no clicks Abd:  Obese, soft, positive bowel sounds, no organomegally, no rebound, no guarding Ext:  2 plus pulses, no edema, no cyanosis, no clubbing Skin:  No rashes no nodules Neuro:  CN II through XII intact, motor grossly intact   DEVICE  Normal device function.  See PaceArt for details.   Assess/Plan:

## 2011-07-25 NOTE — Assessment & Plan Note (Signed)
Interrogation of the patient's pacemaker demonstrates no sustained atrial fibrillation. She will continue her current medical therapy.

## 2011-09-05 ENCOUNTER — Telehealth: Payer: Self-pay | Admitting: Family Medicine

## 2011-09-05 ENCOUNTER — Telehealth: Payer: Self-pay | Admitting: Cardiovascular Disease

## 2011-09-05 MED ORDER — DABIGATRAN ETEXILATE MESYLATE 150 MG PO CAPS: 150.0000 mg | ORAL_CAPSULE | Freq: Two times a day (BID) | ORAL | Status: DC

## 2011-09-05 MED ORDER — INDAPAMIDE 2.5 MG PO TABS: ORAL_TABLET | ORAL | Status: DC

## 2011-09-05 MED ORDER — POTASSIUM CHLORIDE CRYS ER 20 MEQ PO TBCR: 20.0000 meq | EXTENDED_RELEASE_TABLET | Freq: Every day | ORAL | Status: DC

## 2011-09-05 MED ORDER — LOSARTAN POTASSIUM 100 MG PO TABS: 100.0000 mg | ORAL_TABLET | Freq: Every day | ORAL | Status: DC

## 2011-09-05 NOTE — Telephone Encounter (Signed)
LORSARTIN 100MG  AND PRADAXA 150MG  [facility],

## 2011-09-05 NOTE — Telephone Encounter (Signed)
Pt needs Indapamide 2.5mg  tab called in to the [facility] order [facility]. It is not a refill, but it is a new Rx.

## 2011-09-05 NOTE — Telephone Encounter (Signed)
Addended by: Lennox Grumbles on: 09/05/2011 12:55 PM   Modules accepted: Orders

## 2011-09-05 NOTE — Telephone Encounter (Signed)
Refilled medication

## 2011-10-17 ENCOUNTER — Encounter: Payer: Self-pay | Admitting: Internal Medicine

## 2011-10-17 ENCOUNTER — Ambulatory Visit (INDEPENDENT_AMBULATORY_CARE_PROVIDER_SITE_OTHER): Payer: Medicare Other | Admitting: Internal Medicine

## 2011-10-17 VITALS — BP 124/80 | HR 72 | Temp 98.7°F | Wt 243.0 lb

## 2011-10-17 DIAGNOSIS — Z7901 Long term (current) use of anticoagulants: Secondary | ICD-10-CM

## 2011-10-17 DIAGNOSIS — Z8659 Personal history of other mental and behavioral disorders: Secondary | ICD-10-CM

## 2011-10-17 DIAGNOSIS — R7309 Other abnormal glucose: Secondary | ICD-10-CM

## 2011-10-17 DIAGNOSIS — J309 Allergic rhinitis, unspecified: Secondary | ICD-10-CM

## 2011-10-17 DIAGNOSIS — L989 Disorder of the skin and subcutaneous tissue, unspecified: Secondary | ICD-10-CM

## 2011-10-17 DIAGNOSIS — E669 Obesity, unspecified: Secondary | ICD-10-CM

## 2011-10-17 DIAGNOSIS — I2510 Atherosclerotic heart disease of native coronary artery without angina pectoris: Secondary | ICD-10-CM

## 2011-10-17 LAB — POCT CBG (FASTING - GLUCOSE)-MANUAL ENTRY: Glucose Fasting, POC: 111 mg/dL — AB (ref 70–99)

## 2011-10-17 NOTE — Patient Instructions (Addendum)
Continue    efforts to lose  Weight . relook into  The new weight watchers.   3500 calories is the energy content of a pound of body weight .Must have a 3500 cal deficit to lose one pound . Thus decrease 500 calorie equivalent per day in food or drink intake / or exercise  for 7 days to lose one pound.   Wellness check in 6 months or as needed  Will check labs at that time.   Let us know if you wish a nutrition consult also.  Exercise to Stay Healthy Exercise helps you become and stay healthy. EXERCISE IDEAS AND TIPS Choose exercises that:  You enjoy.   Fit into your day.  You do not need to exercise really hard to be healthy. You can do exercises at a slow or medium level and stay healthy. You can:  Stretch before and after working out.   Try yoga, Pilates, or tai chi.   Lift weights.   Walk fast, swim, jog, run, climb stairs, bicycle, dance, or rollerskate.   Take aerobic classes.  Exercises that burn about 150 calories:  Running 1  miles in 15 minutes.   Playing volleyball for 45 to 60 minutes.   Washing and waxing a car for 45 to 60 minutes.   Playing touch football for 45 minutes.   Walking 1  miles in 35 minutes.   Pushing a stroller 1  miles in 30 minutes.   Playing basketball for 30 minutes.   Raking leaves for 30 minutes.   Bicycling 5 miles in 30 minutes.   Walking 2 miles in 30 minutes.   Dancing for 30 minutes.   Shoveling snow for 15 minutes.   Swimming laps for 20 minutes.   Walking up stairs for 15 minutes.   Bicycling 4 miles in 15 minutes.   Gardening for 30 to 45 minutes.   Jumping rope for 15 minutes.   Washing windows or floors for 45 to 60 minutes.  Document Released: 07/22/2010 Document Revised: 06/08/2011 Document Reviewed: 07/22/2010 [facility] Patient Information 2012 [place].

## 2011-10-17 NOTE — Progress Notes (Signed)
Subjective:    Patient ID: [name], female    DOB: 14-May-1946, 66 y.o.   MRN: [id number]  HPI Patient comes in today for follow up of  multiple medical problems.    HT stable  No major changes ; ,injury surgery or hospitalizations. Obesity  Weight Avoiding   Sweet drinks.  Physical acitivity tenditinits.limits  LIPIDS no se of meds  No bleeding  Syncope  .   Mood: doing well on same meds wants to continue . No falling  CV no new CV sx or neuro issues  Check skin area right [place].  Review of Systems  Neg fever cp sob  Ms causes limit physical  activity  Watery eyes in spring allergy but no cough or wheeze .  Past history family history social history reviewed in the electronic medical record.  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  Outpatient Prescriptions Prior to Visit  Medication Sig Dispense Refill  . aspirin 325 MG tablet 325 mg. Take one tablet by mouth once daily.       [name] atorvastatin (LIPITOR) 40 MG tablet Take 1 tablet (40 mg total) by mouth daily.  90 tablet  3  . dabigatran (PRADAXA) 150 MG CAPS Take 1 capsule (150 mg total) by mouth every 12 (twelve) hours.  180 capsule  1  . dofetilide (TIKOSYN) 250 MCG capsule Take 1 capsule (250 mcg total) by mouth 2 (two) times daily.  180 capsule  3  . FLUoxetine (PROZAC) 20 MG capsule Take 1 capsule (20 mg total) by mouth daily. One capsule every morning  90 capsule  3  . indapamide (LOZOL) 2.5 MG tablet Take 1/2 tab daily  45 tablet  2  . losartan (COZAAR) 100 MG tablet Take 1 tablet (100 mg total) by mouth daily.  90 tablet  1  . Lysine HCl 1000 MG TABS as needed.        . metoprolol (TOPROL-XL) 100 MG 24 hr tablet Take one tablet in the morning and 1/2 tablet in the evening  135 tablet  3  . Multiple Vitamin (MULTIVITAMIN) tablet Take 1 tablet by mouth daily.        . nitroGLYCERIN (NITROSTAT) 0.4 MG SL tablet Place 1 tablet (0.4 mg total) under the tongue every 5 (five)  minutes as needed for chest pain.  25 tablet  3  . NON FORMULARY Calcium carbonate powder Take one tablet by mouth once daily.       . NON FORMULARY at bedtime. cpap       . potassium chloride SA (K-DUR,KLOR-CON) 20 MEQ tablet Take 1 tablet (20 mEq total) by mouth daily. One every AM  90 tablet  3  . valACYclovir (VALTREX) 1000 MG tablet Take 2 by mouth and repeat in 12 hours for cold sores       . vitamin B-12 (CYANOCOBALAMIN) 100 MCG tablet Take 50 mcg by mouth daily.      [name] VITAMIN D PO Take 1,000 mg by mouth daily.        . vitamin E 1000 UNIT capsule Take 1,000 Units by mouth daily.            Objective:   Physical Exam  Wt Readings from Last 3 Encounters:  10/17/11 243 lb (110.224 kg)  07/25/11 241 lb (109.317 kg)  07/03/11 240 lb 6.4 oz (109.045 kg)   BP 124/80  Pulse 72  Temp(Src)  98.7 F (37.1 C) (Oral)  Wt 243 lb (110.224 kg)  SpO2 94% Looks well in nad Neck: Supple without adenopathy or masses or bruits Chest:  Clear to A&P without wheezes rales or rhonchi CV:  S1-S2 no gallops or murmurs peripheral perfusion is normal No clubbing cyanosis or edema FBS 119 Skin right Torti very small faint red pink area no scale and not irreg       Assessment & Plan:  Hyperglycemia  Prediabetes levels   Disc this and importance of  Losing weight and controlling sugars  HT controlled  Allergies  Increase BMI  Disc strategies for help  Look into the new weight watcher may be helpful.  dont get discouraged  Mood doing well continue meds . CV stable Skin  Watch area   On face consider derm to see  Total visit 5mins > 50% spent counseling and coordinating care

## 2011-10-24 ENCOUNTER — Encounter: Payer: Self-pay | Admitting: Internal Medicine

## 2011-10-24 DIAGNOSIS — I495 Sick sinus syndrome: Secondary | ICD-10-CM

## 2012-01-23 ENCOUNTER — Encounter: Payer: Self-pay | Admitting: Internal Medicine

## 2012-01-23 DIAGNOSIS — I495 Sick sinus syndrome: Secondary | ICD-10-CM

## 2012-02-27 ENCOUNTER — Telehealth: Payer: Self-pay | Admitting: Cardiovascular Disease

## 2012-02-27 MED ORDER — DOFETILIDE 250 MCG PO CAPS: 250.0000 ug | ORAL_CAPSULE | Freq: Two times a day (BID) | ORAL | Status: DC

## 2012-02-27 MED ORDER — METOPROLOL SUCCINATE ER 100 MG PO TB24: ORAL_TABLET | ORAL | Status: DC

## 2012-02-27 MED ORDER — LOSARTAN POTASSIUM 100 MG PO TABS: 100.0000 mg | ORAL_TABLET | Freq: Every day | ORAL | Status: DC

## 2012-02-27 MED ORDER — DABIGATRAN ETEXILATE MESYLATE 150 MG PO CAPS: 150.0000 mg | ORAL_CAPSULE | Freq: Two times a day (BID) | ORAL | Status: DC

## 2012-02-27 NOTE — Telephone Encounter (Signed)
Fax Received. Refill Completed. [name] (R.M.A)  Pt needs appointment then refill can be made  

## 2012-02-27 NOTE — Telephone Encounter (Signed)
New Problem:    Patient called in needing 90 day supplies of her dabigatran (PRADAXA) 150 MG CAPS, dofetilide (TIKOSYN) 250 MCG capsule, metoprolol (TOPROL-XL) 100 MG 24 hr tablet, losartan (COZAAR) 100 MG tablet. Please call once the order has been placed.

## 2012-03-19 ENCOUNTER — Encounter: Payer: Self-pay | Admitting: Internal Medicine

## 2012-04-29 ENCOUNTER — Telehealth: Payer: Self-pay | Admitting: Internal Medicine

## 2012-04-29 MED ORDER — ATORVASTATIN CALCIUM 40 MG PO TABS: 40.0000 mg | ORAL_TABLET | Freq: Every day | ORAL | Status: DC

## 2012-04-29 NOTE — Telephone Encounter (Signed)
Pt. Needs refill on Atorvastation 40mg . Pharmacy would not refill or send to [place]. [facility] [phone]

## 2012-04-29 NOTE — Telephone Encounter (Signed)
Sent to the pharmacy by e-scribe. 

## 2012-05-06 ENCOUNTER — Encounter: Payer: Self-pay | Admitting: Internal Medicine

## 2012-05-06 ENCOUNTER — Ambulatory Visit (INDEPENDENT_AMBULATORY_CARE_PROVIDER_SITE_OTHER): Payer: Medicare Other | Admitting: Internal Medicine

## 2012-05-06 ENCOUNTER — Ambulatory Visit (INDEPENDENT_AMBULATORY_CARE_PROVIDER_SITE_OTHER)
Admission: RE | Admit: 2012-05-06 | Discharge: 2012-05-06 | Disposition: A | Discharge status: Home / Self Care | Payer: Medicare Other | Source: Ambulatory Visit | Attending: Internal Medicine | Admitting: Internal Medicine

## 2012-05-06 VITALS — BP 122/82 | HR 65 | Temp 98.8°F | Ht 66.25 in | Wt 247.0 lb

## 2012-05-06 DIAGNOSIS — R1013 Epigastric pain: Secondary | ICD-10-CM

## 2012-05-06 DIAGNOSIS — R05 Cough: Secondary | ICD-10-CM

## 2012-05-06 DIAGNOSIS — R7309 Other abnormal glucose: Secondary | ICD-10-CM

## 2012-05-06 DIAGNOSIS — Z7901 Long term (current) use of anticoagulants: Secondary | ICD-10-CM

## 2012-05-06 DIAGNOSIS — Z23 Encounter for immunization: Secondary | ICD-10-CM

## 2012-05-06 DIAGNOSIS — Z0000 Encounter for general adult medical examination without abnormal findings: Secondary | ICD-10-CM

## 2012-05-06 DIAGNOSIS — K649 Unspecified hemorrhoids: Secondary | ICD-10-CM

## 2012-05-06 DIAGNOSIS — I4891 Unspecified atrial fibrillation: Secondary | ICD-10-CM

## 2012-05-06 DIAGNOSIS — R059 Cough, unspecified: Secondary | ICD-10-CM

## 2012-05-06 DIAGNOSIS — Z8709 Personal history of other diseases of the respiratory system: Secondary | ICD-10-CM

## 2012-05-06 DIAGNOSIS — E669 Obesity, unspecified: Secondary | ICD-10-CM

## 2012-05-06 DIAGNOSIS — R053 Chronic cough: Secondary | ICD-10-CM

## 2012-05-06 DIAGNOSIS — Z8659 Personal history of other mental and behavioral disorders: Secondary | ICD-10-CM

## 2012-05-06 DIAGNOSIS — K3189 Other diseases of stomach and duodenum: Secondary | ICD-10-CM

## 2012-05-06 DIAGNOSIS — Z789 Other specified health status: Secondary | ICD-10-CM

## 2012-05-06 DIAGNOSIS — Z973 Presence of spectacles and contact lenses: Secondary | ICD-10-CM

## 2012-05-06 DIAGNOSIS — G4733 Obstructive sleep apnea (adult) (pediatric): Secondary | ICD-10-CM

## 2012-05-06 DIAGNOSIS — I2510 Atherosclerotic heart disease of native coronary artery without angina pectoris: Secondary | ICD-10-CM

## 2012-05-06 LAB — LIPID PANEL
Cholesterol: 126 mg/dL (ref 0–200)
HDL: 45.8 mg/dL (ref >39.00)
LDL Cholesterol: 59 mg/dL (ref 0–99)
Total CHOL/HDL Ratio: 3
Triglycerides: 106 mg/dL (ref 0.0–149.0)
VLDL: 21.2 mg/dL (ref 0.0–40.0)

## 2012-05-06 LAB — CBC WITH DIFFERENTIAL/PLATELET
Basophils Absolute: 0.1 10*3/uL (ref 0.0–0.1)
Basophils Relative: 1 % (ref 0.0–3.0)
Eosinophils Absolute: 0.7 10*3/uL (ref 0.0–0.7)
Eosinophils Relative: 6 % — ABNORMAL HIGH (ref 0.0–5.0)
HCT: 37.1 % (ref 36.0–46.0)
Hemoglobin: 11.9 g/dL — ABNORMAL LOW (ref 12.0–15.0)
Lymphocytes Relative: 24 % (ref 12.0–46.0)
Lymphs Abs: 2.6 10*3/uL (ref 0.7–4.0)
MCHC: 32.2 g/dL (ref 30.0–36.0)
MCV: 88.8 fl (ref 78.0–100.0)
Monocytes Absolute: 0.9 10*3/uL (ref 0.1–1.0)
Monocytes Relative: 8.4 % (ref 3.0–12.0)
Neutro Abs: 6.7 10*3/uL (ref 1.4–7.7)
Neutrophils Relative %: 60.6 % (ref 43.0–77.0)
Platelets: 428 10*3/uL — ABNORMAL HIGH (ref 150.0–400.0)
RBC: 4.18 Mil/uL (ref 3.87–5.11)
RDW: 14.4 % (ref 11.5–14.6)
WBC: 11 10*3/uL — ABNORMAL HIGH (ref 4.5–10.5)

## 2012-05-06 LAB — TSH: TSH: 2.07 u[IU]/mL (ref 0.35–5.50)

## 2012-05-06 LAB — HEPATIC FUNCTION PANEL
ALT: 21 U/L (ref 0–35)
AST: 23 U/L (ref 0–37)
Albumin: 4 g/dL (ref 3.5–5.2)
Alkaline Phosphatase: 97 U/L (ref 39–117)
Bilirubin, Direct: 0.1 mg/dL (ref 0.0–0.3)
Total Bilirubin: 0.7 mg/dL (ref 0.3–1.2)
Total Protein: 7.8 g/dL (ref 6.0–8.3)

## 2012-05-06 LAB — BASIC METABOLIC PANEL
BUN: 17 mg/dL (ref 6–23)
CO2: 27 mEq/L (ref 19–32)
Calcium: 9.5 mg/dL (ref 8.4–10.5)
Chloride: 98 mEq/L (ref 96–112)
Creatinine, Ser: 0.9 mg/dL (ref 0.4–1.2)
GFR: 70.12 mL/min (ref >60.00)
Glucose, Bld: 114 mg/dL — ABNORMAL HIGH (ref 70–99)
Potassium: 4.8 mEq/L (ref 3.5–5.1)
Sodium: 134 mEq/L — ABNORMAL LOW (ref 135–145)

## 2012-05-06 LAB — HEMOGLOBIN A1C: Hgb A1c MFr Bld: 6.4 % (ref 4.6–6.5)

## 2012-05-06 MED ORDER — FLUOXETINE HCL 20 MG PO CAPS: 20.0000 mg | ORAL_CAPSULE | Freq: Every day | ORAL | Status: DC

## 2012-05-06 MED ORDER — INDAPAMIDE 2.5 MG PO TABS: ORAL_TABLET | ORAL | Status: DC

## 2012-05-06 NOTE — Progress Notes (Signed)
Chief Complaint  Patient presents with  . Annual Exam    Medicare.  Dx with bronchitis 6wks ago.    HPI: Patient comes in today for Preventive Health Care visit Medicare wellness and has a number of other problems some chronic anc some new to discuss.  Had recent" bronchitis "was out of town and  Had bronchitis;  rx with antibiotic and inhaler for med and cough med.   No chext x ray.  6 weeks ago  Mostly better but just stopped coughing in am.  About 80 % better.  Out of the inhaler. No syncope fever chills  ? Wheeze  Has had some stomach upset for a week  PP pain without vomiting  Has begun Prilosec and some help . Also has had intermittent bleeding hemorrhoids described as blood on tissue and frequent not mixed in stool  2 months of sx and not resolving  No melena. NO NVD with this. Used prep H some and neosporin.  On fluoxetine and feels helping mood  Stable  Needs refill .  BP no change in meds   No swelling or other bleeding. Sees cards on a reg basis and no change in meds.   Hearing: ok   Vision:  No limitations at present .  Safety:  Has smoke detector and wears seat belts.  No firearms. No excess sun exposure. Sees dentist regularly.  Falls:  No but fell out of bed  In a dream.  Night mare.  No injury   Advance directive :  Reviewed    Memory: Felt to be good  , no concern from her or her family.  Depression: No anhedonia unusual crying or depressive symptoms  Nutrition: Eats well balanced diet; adequate calcium and vitamin D. No swallowing chewiing problems.  Injury: no major injuries in the last six months.  Other healthcare providers:  Reviewed today .  Social:  Lives with husband married. No pets.   Preventive parameters: up-to-date on colonoscopy, mammogram, immunizations. Including Tdap and pneumovax.  ADLS:   There are no problems or need for assistance  driving, feeding, obtaining food, dressing, toileting and bathing, managing money using phone. She is  independent.  NO reg exercise no tob or ets.    ROS:  GEN/ HEENT: No fever, significant weight changes sweats headaches vision problems hearing changes, CV/ PULM; No chest pain shortness of breath cough, syncope,edema  change in exercise tolerance. GI /GU: No adominal pain, vomiting, change in bowel habits. No blood in the stool. No significant GU symptoms. SKIN/HEME: ,no acute skin rashes suspicious lesions or bleeding. No lymphadenopathy, nodules, masses.  NEURO/ PSYCH:  No neurologic signs such as weakness numbness. No depression anxiety. IMM/ Allergy: No unusual infections.  Allergy .   REST of 12 system review negative except as per HPI   Past Medical History  Diagnosis Date  . History of CVA (cerebrovascular accident)   . CAD (coronary artery disease) [id number]    post PTCA with bare-metal stenting to mid RCA in December 2004     . Hypertension   . Depression   . Chronic atrial fibrillation 06/2007    Tachybradycardia pacemaker  . Pacemaker   . OSA on CPAP   . Anticoagulant long-term use     pradaxa  . Transfusion history   . Hyperlipidemia   . Edema of lower extremity   . History of acute inferior wall MI   . Anxiety   . Dyslipidemia   . Diplopia 06/19/2008  Qualifier: Diagnosis of  By: [name] MD, [name]     Family History  Problem Relation Age of Onset  . Suicidality Father     suicide death pt was 3 yrs  . Arrhythmia Mother     heart arrthymia  . Hypertension Mother   . Heart attack Brother     died age 4  . Heart disease Paternal Aunt   . Cancer Maternal Grandfather     prostate    History   Social History  . Marital Status: Married    Spouse Name: N/A    Number of Children: 0  . Years of Education: HS   Occupational History  . retired     previously worked [employer]  . Smoking status: Former Smoker -- 1.0 packs/day for 5 years    Types: Cigarettes    Start date: 05/11/1978    Quit date: 07/03/1982  .  Smokeless tobacco: None  . Alcohol Use: Yes     Comment: once q 6 months-socailly  . Drug Use: No  . Sexually Active: None   Other Topics Concern  . 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 aspirin 325 MG tablet 325 mg. Take one tablet by mouth once daily.       [name] atorvastatin (LIPITOR) 40 MG tablet Take 1 tablet (40 mg total) by mouth daily.  90 tablet  0  . b complex vitamins tablet Take 1 tablet by mouth daily.      . dabigatran (PRADAXA) 150 MG CAPS Take 1 capsule (150 mg total) by mouth every 12 (twelve) hours.  180 capsule  0  . dofetilide (TIKOSYN) 250 MCG capsule Take 1 capsule (250 mcg total) by mouth 2 (two) times daily.  180 capsule  0  . FLUoxetine (PROZAC) 20 MG capsule Take 1 capsule (20 mg total) by mouth daily. One capsule every morning  90 capsule  3  . indapamide (LOZOL) 2.5 MG tablet Take 1/2 tab daily  45 tablet  3  . losartan (COZAAR) 100 MG tablet Take 1 tablet (100 mg total) by mouth daily.  90 tablet  0  . Lysine HCl 1000 MG TABS as needed.        . metoprolol succinate (TOPROL-XL) 100 MG 24 hr tablet Take one tablet in the morning and 1/2 tablet in the evening  135 tablet  0  . Multiple Vitamin (MULTIVITAMIN) tablet Take 1 tablet by mouth daily.        . nitroGLYCERIN (NITROSTAT) 0.4 MG SL tablet Place 1 tablet (0.4 mg total) under the tongue every 5 (five) minutes as needed for chest pain.  25 tablet  3  . NON FORMULARY Calcium carbonate powder Take one tablet by mouth once daily.       . NON FORMULARY at bedtime. cpap       . potassium chloride SA (K-DUR,KLOR-CON) 20 MEQ tablet Take 1 tablet (20 mEq total) by mouth daily. One every AM  90 tablet  3  . valACYclovir  (VALTREX) 1000 MG tablet Take 2 by mouth and repeat in 12 hours for cold sores       . vitamin B-12 (CYANOCOBALAMIN) 100 MCG tablet Take 50 mcg  by mouth daily.      [name] VITAMIN D PO Take 1,000 mg by mouth daily.        . vitamin E 1000 UNIT capsule Take 1,000 Units by mouth daily.      . [DISCONTINUED] FLUoxetine (PROZAC) 20 MG capsule Take 1 capsule (20 mg total) by mouth daily. One capsule every morning  90 capsule  3  . [DISCONTINUED] indapamide (LOZOL) 2.5 MG tablet Take 1/2 tab daily  45 tablet  2    EXAM:  BP 122/82  Pulse 65  Temp 98.8 F (37.1 C) (Oral)  Ht 5' 6.25" (1.683 m)  Wt 247 lb (112.038 kg)  BMI 39.57 kg/m2  SpO2 94%  Body mass index is 39.57 kg/(m^2).  Physical Exam: Vital signs reviewed [id number] is a well-developed well-nourished alert cooperative   female who appears her stated age in no acute distress.  mildy hoarse  Slightly short of breath  HEENT: normocephalic atraumatic , Eyes: PERRL EOM's full, conjunctiva clear, Nares: paten,t no deformity discharge or tenderness., Ears: no deformity EAC's clear TMs with normal landmarks. Mouth: clear OP, no lesions, edema.  Moist mucous membranes. Dentition in adequate repair. NECK: supple without masses, thyromegaly or bruits. CHEST/PULM:  Clear to auscultation and percussion breath sounds equal no wheeze , rales or rhonchi. No chest wall deformities or tenderness. Breast: normal by inspection . No dimpling, discharge, masses, tenderness or discharge . CV: PMI is nondisplaced, S1 S2 no gallops, murmurs, rubs. Peripheral pulses are present without delay.No JVD .  ABDOMEN: Bowel sounds normal nontender  No guard or rebound, no hepato splenomegal no CVA tenderness.  No hernia. Extremtities:  No clubbing cyanosis or edema, no acute joint swelling or redness no focal atrophy NEURO:  Oriented x3, cranial nerves 3-12 appear to be intact, no obvious focal weakness,gait within normal limits no abnormal reflexes or asymmetrical SKIN:  No acute rashes normal turgor, color, no bruising or petechiae. PSYCH: Oriented, good eye contact, no obvious depression anxiety, cognition and judgment appear normal. LN: no cervical axillary inguinal adenopathy Oriented x 3 and no noted deficits in memory, attention, and speech.  Lab Results  Component Value Date   WBC 11.0* 05/06/2012   HGB 11.9* 05/06/2012   HCT 37.1 05/06/2012   PLT 428.0* 05/06/2012   GLUCOSE 114* 05/06/2012   CHOL 126 05/06/2012   TRIG 106.0 05/06/2012   HDL 45.80 05/06/2012   LDLCALC 59 05/06/2012   ALT 21 05/06/2012   AST 23 05/06/2012   NA 134* 05/06/2012   K 4.8 05/06/2012   CL 98 05/06/2012   CREATININE 0.9 05/06/2012   BUN 17 05/06/2012   CO2 27 05/06/2012   TSH 2.07 05/06/2012   INR 2.1* 06/10/2008   HGBA1C 6.4 05/06/2012    ASSESSMENT AND PLAN:  Discussed the following assessment and plan: Preventive Health Care Counseled regarding healthy nutrition, exercise, sleep, injury prevention, calcium vit d and healthy weight . Flu vaccine rest utd    1. Visit for preventive health examination  b complex vitamins tablet, Basic metabolic panel, CBC with Differential, Hepatic function panel, Lipid panel, TSH, Hemoglobin A1c  2. Anticoagulant long-term use  b complex vitamins tablet, Basic metabolic panel, CBC with Differential, Hepatic function panel, Lipid panel, TSH, Hemoglobin A1c, Ambulatory referral to Gastroenterology  3. HYPERGLYCEMIA  b complex vitamins tablet, Basic metabolic panel, CBC with Differential, Hepatic function panel, Lipid panel, TSH, Hemoglobin A1c  4. CORONARY ARTERY DISEASE  b complex vitamins tablet, Basic metabolic panel, CBC with Differential,  Hepatic function panel, Lipid panel, TSH, Hemoglobin A1c  5. Dyspepsia  b complex vitamins tablet, Basic metabolic panel, CBC with Differential, Hepatic function panel, Lipid panel, TSH, Hemoglobin A1c, Ambulatory referral to Gastroenterology   ok to use prn prilosec short term could be from the antibiotic  given.  6. Hemorrhoids  b complex vitamins tablet, Basic metabolic panel, CBC with Differential, Hepatic function panel, Lipid panel, TSH, Hemoglobin A1c, Ambulatory referral to Gastroenterology   Recurrent by history with history of bleeding not severe but is on Pradaxa last colon 2007  Gi assessment  7. Hx of acute bronchitis with bronchospasm  b complex vitamins tablet, Basic metabolic panel, CBC with Differential, Hepatic function panel, Lipid panel, TSH, Hemoglobin A1c, DG Chest 2 View   SP rx with antibiotica nd in hal[place] ? still some residual cough get x ray  continue  8. OBSTRUCTIVE SLEEP APNEA  b complex vitamins tablet, Basic metabolic panel, CBC with Differential, Hepatic function panel, Lipid panel, TSH, Hemoglobin A1c  9. ATRIAL FIBRILLATION, PAROXYSMAL  b complex vitamins tablet, Basic metabolic panel, CBC with Differential, Hepatic function panel, Lipid panel, TSH, Hemoglobin A1c  10. Cough, persistent  DG Chest 2 View  11. Need for prophylactic vaccination and inoculation against influenza    12. DEPRESSION, HX OF    13. Wears glasses    14. OBESITY       Patient Instructions  We'll notify you of labs results when available. Healthy lifestyle with appropriate weight loss would be helpful for your medical problems. Get a chest x-ray as we discussed. Someone will contact you about GI referral in regard to your intermittent bleeding hemorrhoids and dyspepsia. At this time can remain on Prilosec for 2 weeks. Flu vaccine today. Followup visit in 6 months or depending on lab and how you are doing.     Lottie Dawson

## 2012-05-06 NOTE — Patient Instructions (Signed)
We'll notify you of labs results when available. Healthy lifestyle with appropriate weight loss would be helpful for your medical problems. Get a chest x-ray as we discussed. Someone will contact you about GI referral in regard to your intermittent bleeding hemorrhoids and dyspepsia. At this time can remain on Prilosec for 2 weeks. Flu vaccine today. Followup visit in 6 months or depending on lab and how you are doing.

## 2012-05-07 ENCOUNTER — Encounter: Payer: Self-pay | Admitting: Gastroenterology

## 2012-05-11 ENCOUNTER — Encounter: Payer: Self-pay | Admitting: Internal Medicine

## 2012-05-11 DIAGNOSIS — R1013 Epigastric pain: Secondary | ICD-10-CM | POA: Insufficient documentation

## 2012-05-11 DIAGNOSIS — R053 Chronic cough: Secondary | ICD-10-CM | POA: Insufficient documentation

## 2012-05-11 DIAGNOSIS — K649 Unspecified hemorrhoids: Secondary | ICD-10-CM | POA: Insufficient documentation

## 2012-05-11 DIAGNOSIS — R05 Cough: Secondary | ICD-10-CM | POA: Insufficient documentation

## 2012-05-27 ENCOUNTER — Encounter: Payer: Self-pay | Admitting: *Deleted

## 2012-06-03 ENCOUNTER — Telehealth: Payer: Self-pay | Admitting: Pulmonary Disease

## 2012-06-03 ENCOUNTER — Telehealth: Payer: Self-pay | Admitting: Internal Medicine

## 2012-06-03 DIAGNOSIS — I495 Sick sinus syndrome: Secondary | ICD-10-CM

## 2012-06-03 NOTE — Telephone Encounter (Signed)
Pt advised and is ok with this and will call PCP. Windom Bing, CMA

## 2012-06-03 NOTE — Telephone Encounter (Signed)
I spoke with pt and she stated she was d/c'd from [facility] on Friday. She was in their for 5 days with double PNA. Her stats was as low as 75%. She was advised she needed to f/u with a pulmonologist's ASAP for "extensive" tests on her lungs. She was sent home on oxygen and ABX. [place] only see's pt for OSA. She wants to see [facility] for pulmonary as well. No ASAP 30 minute openings. Please advise [place] thanks

## 2012-06-03 NOTE — Telephone Encounter (Signed)
Pt was discharge on 11-29  from [place], [place]. Per pt she was dx with double pneumonia. Pt stated MD at hospital told her to follow up with Pulmonologist. Pt has seen Dr Gwenette Greet for cpap issues. Pt call Dr Gwenette Greet office and was told to follow up with PCP. Dr Gwenette Greet unable to see pt. Pt needs post hos fup this week. Where can I work pt in at?

## 2012-06-03 NOTE — Telephone Encounter (Signed)
What I would suggest is that she see her primary md first since I do not have 47min  Pulmonary consult slot ASAP.  Then she can refer her for pulmonary evaluation if she feels that is necessary.

## 2012-06-04 ENCOUNTER — Ambulatory Visit: Payer: Medicare Other | Admitting: Gastroenterology

## 2012-06-04 NOTE — Telephone Encounter (Signed)
work her in at 4 pm appt  Wednesday( tomorrow )    .

## 2012-06-04 NOTE — Telephone Encounter (Signed)
Pt called and was checking on status of getting a work in [place] this wk with Dr [name]. Pls advise.

## 2012-06-05 ENCOUNTER — Ambulatory Visit (INDEPENDENT_AMBULATORY_CARE_PROVIDER_SITE_OTHER): Payer: Medicare Other | Admitting: Internal Medicine

## 2012-06-05 ENCOUNTER — Encounter: Payer: Self-pay | Admitting: Internal Medicine

## 2012-06-05 VITALS — BP 136/86 | HR 84 | Temp 98.6°F | Wt 244.0 lb

## 2012-06-05 DIAGNOSIS — Z950 Presence of cardiac pacemaker: Secondary | ICD-10-CM

## 2012-06-05 DIAGNOSIS — R0902 Hypoxemia: Secondary | ICD-10-CM

## 2012-06-05 DIAGNOSIS — J189 Pneumonia, unspecified organism: Secondary | ICD-10-CM | POA: Insufficient documentation

## 2012-06-05 DIAGNOSIS — I4891 Unspecified atrial fibrillation: Secondary | ICD-10-CM

## 2012-06-05 DIAGNOSIS — G4733 Obstructive sleep apnea (adult) (pediatric): Secondary | ICD-10-CM

## 2012-06-05 DIAGNOSIS — Z7901 Long term (current) use of anticoagulants: Secondary | ICD-10-CM

## 2012-06-05 DIAGNOSIS — D649 Anemia, unspecified: Secondary | ICD-10-CM | POA: Insufficient documentation

## 2012-06-05 DIAGNOSIS — I2510 Atherosclerotic heart disease of native coronary artery without angina pectoris: Secondary | ICD-10-CM

## 2012-06-05 DIAGNOSIS — J988 Other specified respiratory disorders: Secondary | ICD-10-CM

## 2012-06-05 DIAGNOSIS — J22 Unspecified acute lower respiratory infection: Secondary | ICD-10-CM

## 2012-06-05 LAB — POCT HEMOGLOBIN: Hemoglobin: 12 g/dL — AB (ref 12.2–16.2)

## 2012-06-05 MED ORDER — IPRATROPIUM BROMIDE 0.02 % IN SOLN
0.5000 mg | RESPIRATORY_TRACT | Status: AC
  Administered 2012-06-05: 0.5 mg via RESPIRATORY_TRACT

## 2012-06-05 NOTE — Telephone Encounter (Signed)
Called pt and schd work in appt for today as noted.

## 2012-06-05 NOTE — Patient Instructions (Addendum)
Use oxygen   2 liters . When up and around .   Use nebulizer every 6 hours as needed.  Get c xray  When possible before seeing Dr.  Gwenette Greet.   Get copy of final discharge summary and labs scans etc to [place]  For review.

## 2012-06-05 NOTE — Progress Notes (Signed)
Chief Complaint  Patient presents with  . Follow-up    Hospital/pneumonia,   shortness of breath    HPI: Pt comes in today with husband  for fu post hosp in [place].  Dc 11 29 13   Traveled for t giving and developed  Bilateral pna and hypoxia    Given iv antibiotic and then Levaquin for 5 days  . Dc on home o2 for hypoxia of 85 on ra  But advanced hc said didn't qualify for home O2 .Also duoneb that helped her in hospital.  Having sob and tight cough  No fever still tired  . About 75% better than at onset. Was also noted to be anemic at the 10 31 level. Had studies done stool heme negative . Denies bleeding  Falling  Heart sx otherwise.  Last chest x-ray was last Friday 5 days ago and apparently infiltrates had improved. Referred to pulmonary but cant be seen till next week by Dr Gwenette Greet .  Last dose levaquin last pm.  Handwritten preliminary note by physician in [place]. ROS: See pertinent positives and negatives per HPI.neg cp pos sob tight cough no hemoptysis fever vomiting .   Past Medical History  Diagnosis Date  . History of CVA (cerebrovascular accident)   . CAD (coronary artery disease) [id number]    post PTCA with bare-metal stenting to mid RCA in December 2004     . Hypertension   . Depression   . Chronic atrial fibrillation 06/2007    Tachybradycardia pacemaker  . Pacemaker   . OSA on CPAP   . Anticoagulant long-term use     pradaxa  . Transfusion history   . Hyperlipidemia   . Edema of lower extremity   . History of acute inferior wall MI   . Anxiety   . Dyslipidemia   . Diplopia 06/19/2008    Qualifier: Diagnosis of  By: [name] MD, [name]   . Hemorrhoids     Family History  Problem Relation Age of Onset  . Suicidality Father     suicide death pt was 3 yrs  . Arrhythmia Mother     heart arrthymia  . Hypertension Mother   . Heart attack Brother     died age 49  . Heart disease Paternal Aunt   . Cancer Maternal Grandfather     prostate     History   Social History  . Marital Status: Married    Spouse Name: N/A    Number of Children: 0  . Years of Education: HS   Occupational History  . retired     previously worked [employer]  . Smoking status: Former Smoker -- 1.0 packs/day for 5 years    Types: Cigarettes    Start date: 05/11/1978    Quit date: 07/03/1982  . Smokeless tobacco: None  . Alcohol Use: Yes     Comment: once q 6 months-socailly  . Drug Use: No  . Sexually Active: None   Other Topics Concern  . 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 Albuterol Sulfate (PROAIR HFA IN) Inhale 2 puffs into the lungs every 6 (six) hours as needed.      [name] aspirin  325 MG tablet 325 mg. Take one tablet by mouth once daily.       [name] atorvastatin (LIPITOR) 40 MG tablet Take 1 tablet (40 mg total) by mouth daily.  90 tablet  0  . b complex vitamins tablet Take 1 tablet by mouth daily.      . dabigatran (PRADAXA) 150 MG CAPS Take 1 capsule (150 mg total) by mouth every 12 (twelve) hours.  180 capsule  0  . dofetilide (TIKOSYN) 250 MCG capsule Take 1 capsule (250 mcg total) by mouth 2 (two) times daily.  180 capsule  0  . FLUoxetine (PROZAC) 20 MG capsule Take 1 capsule (20 mg total) by mouth daily. One capsule every morning  90 capsule  3  . indapamide (LOZOL) 2.5 MG tablet Take 1/2 tab daily  45 tablet  3  . losartan (COZAAR) 100 MG tablet Take 1 tablet (100 mg total) by mouth daily.  90 tablet  0  . Lysine HCl 1000 MG TABS as needed.        . metoprolol succinate (TOPROL-XL) 100 MG 24 hr tablet Take one tablet in the morning and 1/2 tablet in the evening  135 tablet  0  . Multiple Vitamin  (MULTIVITAMIN) tablet Take 1 tablet by mouth daily.        . NON FORMULARY Calcium carbonate powder Take one tablet by mouth once daily.       . NON FORMULARY at bedtime. cpap       . potassium chloride SA (K-DUR,KLOR-CON) 20 MEQ tablet Take 1 tablet (20 mEq total) by mouth daily. One every AM  90 tablet  3  . valACYclovir (VALTREX) 1000 MG tablet Take 2 by mouth and repeat in 12 hours for cold sores       . vitamin B-12 (CYANOCOBALAMIN) 100 MCG tablet Take 50 mcg by mouth daily.      [name] VITAMIN D PO Take 1,000 mg by mouth daily.        . vitamin E 1000 UNIT capsule Take 1,000 Units by mouth daily.      . nitroGLYCERIN (NITROSTAT) 0.4 MG SL tablet Place 1 tablet (0.4 mg total) under the tongue every 5 (five) minutes as needed for chest pain.  25 tablet  3   Facility-Administered Encounter Medications as of 06/05/2012  Medication Dose Route Frequency Provider Last Rate Last Dose  . [COMPLETED] ipratropium (ATROVENT) nebulizer solution 0.5 mg  0.5 mg Nebulization NOW [name], MD   0.5 mg at 06/05/12 1735    EXAM:  BP 136/86  Pulse 84  Temp 98.6 F (37 C) (Oral)  Wt 244 lb (110.678 kg)  SpO2 85%  There is no height on file to calculate BMI. After sitting and resting  Pulse ox up to 92% -94 RA GENERAL: vitals reviewed and listed above, alert, oriented, some dyspneic  HEENT: atraumatic, conjunctiva  clear, no obvious abnormalities on inspection of external nose and ears OP : no lesion edema or exudate  Mild erythema   NECK: no obvious masses on inspection palpation  LUNGS: bs some wheeze crackles  Bases   Given duoneb and cough looser and better air movement  Speech  slighty breathless.  CV: , no clubbing cyanosis or   nl cap refill  No g or m noted  No jvd noted  MS: moves all extremities without noticeable focal  abnormality PSYCH: pleasant and cooperative, no obvious depression or anxiety ASSESSMENT AND PLAN:  Discussed the following assessment and plan:  1. Pneumonia  bilateral  ipratropium (ATROVENT) nebulizer solution 0.5 mg   sp rx with levaquin and iv finished last pm. bilateral with concern for early  IPF also  improved but still hypoxic with movement   2. Hypoxia     with activty ( walking) minor acitivity  85%  RA ;  at rest increase to 90 range  .  order O2 by [place]    3. Lower respiratory infection (e.g., bronchitis, pneumonia, pneumonitis, pulmonitis)  DG Chest 2 View  4. Anemia  POCT hemoglobin   drop in hg  to 10/31as reported from hospital  dc 11 29  .  labs pending per dr Roselle Locus.  hg today is 12.1   5. Anticoagulant long-term use    6. OBSTRUCTIVE SLEEP APNEA    7. CORONARY ARTERY DISEASE    8. ATRIAL FIBRILLATION, PAROXYSMAL    9. PACEMAKER, PERMANENT    hospital doc was concerned about poss early  IPF based on exam and x ray  .  Orders for home o2  Chest x ray and nebulizer duoneb  every 6 hours  And see dr Gwenette Greet  Next week .  -Patient advised to return or notify health care team  immediately if symptoms worsen or persist or new concerns arise.  Patient Instructions  Use oxygen   2 liters . When up and around .   Use nebulizer every 6 hours as needed.  Get c xray  When possible before seeing Dr.  Gwenette Greet.   Get copy of final discharge summary and labs scans etc to [place]  For review.     [name]. [name] M.D. Total visit 40 mins > 50% spent counseling and coordinating care

## 2012-06-06 ENCOUNTER — Telehealth: Payer: Self-pay | Admitting: Internal Medicine

## 2012-06-06 NOTE — Telephone Encounter (Signed)
Spoke to Alanson (rep) from [employer].  He will call [facility] and clarify the order.

## 2012-06-06 NOTE — Telephone Encounter (Signed)
She si calling for clarification of O2 order. Order currently says "home O2" - nocturnal box was checked, but then crossed out and "exhertion" was written. She wants to clarify that Dr. Anselmo Rod wants continuous O2, not nocturnal. States pt cannot get just the nocturnal - would need to have some sort of test through Medicare to get only nocturnal. Please call.

## 2012-06-07 ENCOUNTER — Other Ambulatory Visit: Payer: Self-pay | Admitting: Cardiovascular Disease

## 2012-06-07 NOTE — Telephone Encounter (Signed)
New problem:    pradaxa 150 mg  tikosyn  250 mcg.- refill  @ [phone]-[facility]   lorstan 100 mg   [id number]- [facility] .

## 2012-06-10 ENCOUNTER — Ambulatory Visit (INDEPENDENT_AMBULATORY_CARE_PROVIDER_SITE_OTHER)
Admission: RE | Admit: 2012-06-10 | Discharge: 2012-06-10 | Disposition: A | Discharge status: Home / Self Care | Payer: Medicare Other | Source: Ambulatory Visit | Attending: Internal Medicine | Admitting: Internal Medicine

## 2012-06-10 DIAGNOSIS — J988 Other specified respiratory disorders: Secondary | ICD-10-CM

## 2012-06-10 DIAGNOSIS — J22 Unspecified acute lower respiratory infection: Secondary | ICD-10-CM

## 2012-06-10 MED ORDER — DOFETILIDE 250 MCG PO CAPS: 250.0000 ug | ORAL_CAPSULE | Freq: Two times a day (BID) | ORAL | Status: DC

## 2012-06-10 MED ORDER — LOSARTAN POTASSIUM 100 MG PO TABS: 100.0000 mg | ORAL_TABLET | Freq: Every day | ORAL | Status: DC

## 2012-06-10 MED ORDER — DABIGATRAN ETEXILATE MESYLATE 150 MG PO CAPS: 150.0000 mg | ORAL_CAPSULE | Freq: Two times a day (BID) | ORAL | Status: DC

## 2012-06-10 NOTE — Telephone Encounter (Signed)
Pt needs appointment then refill can be made Fax Received. Refill Completed. Laylynn Campanella Chowoe (R.M.A)   

## 2012-06-11 ENCOUNTER — Encounter: Payer: Self-pay | Admitting: Pulmonary Disease

## 2012-06-11 ENCOUNTER — Ambulatory Visit (INDEPENDENT_AMBULATORY_CARE_PROVIDER_SITE_OTHER): Payer: Medicare Other | Admitting: Pulmonary Disease

## 2012-06-11 VITALS — BP 120/90 | HR 77 | Temp 97.8°F | Ht 66.5 in | Wt 246.4 lb

## 2012-06-11 DIAGNOSIS — R0600 Dyspnea, unspecified: Secondary | ICD-10-CM

## 2012-06-11 DIAGNOSIS — R059 Cough, unspecified: Secondary | ICD-10-CM

## 2012-06-11 DIAGNOSIS — R0989 Other specified symptoms and signs involving the circulatory and respiratory systems: Secondary | ICD-10-CM

## 2012-06-11 DIAGNOSIS — R0609 Other forms of dyspnea: Secondary | ICD-10-CM

## 2012-06-11 DIAGNOSIS — R053 Chronic cough: Secondary | ICD-10-CM

## 2012-06-11 DIAGNOSIS — I272 Pulmonary hypertension, unspecified: Secondary | ICD-10-CM | POA: Insufficient documentation

## 2012-06-11 DIAGNOSIS — R05 Cough: Secondary | ICD-10-CM

## 2012-06-11 MED ORDER — PREDNISONE 20 MG PO TABS: ORAL_TABLET | ORAL | Status: DC

## 2012-06-11 NOTE — Assessment & Plan Note (Signed)
It is unclear how much of her persistent cough is upper airway, and related to cyclical coughing, versus lower airway.  Will treat with a course of prednisone for possible bronchiolitis, and see how she responds.

## 2012-06-11 NOTE — Patient Instructions (Addendum)
Do not use inhaler or nebulizer unless having a hard time breathing. Stop oxygen except at night while sleeping.  Will check oxygen level overnight to see if you still require. Will treat with a course of prednisone for possible post-infectious bronchiolitis.  If you continue to have issues, will consider a scan of your chest.  followup with me in 2 weeks to check on your progress, but call if you are no better.

## 2012-06-11 NOTE — Progress Notes (Signed)
Subjective:    Patient ID: [name], female    DOB: April 12, 1946, 66 y.o.   MRN: [id number]  HPI The patient is a 66 year old female who I've been asked to see for persistent dyspnea and cough.  She was in her usual state of health until September of this year while visiting [place], and began to have a cough with congestion and scant pale yellow mucous.  She also noted increasing shortness of breath.  She was seen in an emergency room out [place], and treated with antibiotics, cough medicine, as well as what sounds like a proair inhaler.  All of her symptoms improved except for the cough, and this continued to be dry and hacking in nature.  She also continued to have dyspnea on exertion that never returned to her usual baseline.  She had a chest x-ray the beginning of November, and showed prominent bronchovascular markings, but no acute process.  The end of November, she began to notice severe worsening shortness of breath, and went to a local hospital in [place] where her oxygen saturations were 75%?  She had a chest x-ray while there, and told that she had "double pneumonia".  At this point, she had very little mucus, but did have fever.  She was admitted to the hospital and placed on IV antibiotics, and while there started coughing up rusty brown mucus.  She was also treated with inhaled bronchodilators.  She went home after a few days, and feels that she made steady improvement.  She was seen yesterday in Dr. Velora Mediate office, where she was found to have an oxygen saturation of 85%.  She was started on oxygen at home.  Currently, the patient still has a dry hacking cough, but is producing no mucus.  She feels a tickle in her throat, and admits to having frequent throat clearing.  She feels that her breathing is improving, and her sats on room air today are 95% at rest.  She denies any issues with worsening reflux disease.  She had a followup chest x-ray yesterday that shows no acute process, but still has  prominent bronchovascular markings.   Review of Systems  Constitutional: Negative for fever and unexpected weight change.  HENT: Positive for congestion, sore throat ( from O2), rhinorrhea, voice change ( from O2) and postnasal drip. Negative for ear pain, nosebleeds, sneezing, trouble swallowing, dental problem and sinus pressure.   Eyes: Negative for redness and itching.  Respiratory: Positive for cough and shortness of breath. Negative for chest tightness and wheezing.   Cardiovascular: Negative for palpitations and leg swelling.  Gastrointestinal: Negative for nausea and vomiting.  Genitourinary: Negative for dysuria.  Musculoskeletal: Negative for joint swelling.       Hand/feet swelling  Skin: Negative for rash.  Neurological: Negative for headaches.  Hematological: Does not bruise/bleed easily.  Psychiatric/Behavioral: Negative for dysphoric mood. The patient is not nervous/anxious.        Objective:   Physical Exam Constitutional:  Overweight female, no acute distress  HENT:  Nares patent without discharge  Oropharynx without exudate, palate and uvula are normal  Eyes:  Perrla, eomi, no scleral icterus  Neck:  No JVD, no TMG  Cardiovascular:  Normal rate, sounds regular rhythm, no rubs or gallops.  No murmurs        Intact distal pulses  Pulmonary :  Normal air movement, no stridor or respiratory distress   No rhonchi, or wheezing.  +scattered faint crackles.  Abdominal:  Soft, nondistended, bowel sounds present.  No tenderness noted.   Musculoskeletal:  minimal lower extremity edema noted.  Lymph Nodes:  No cervical lymphadenopathy noted  Skin:  No cyanosis noted  Neurologic:  Alert, appropriate, moves all 4 extremities without obvious deficit.         Assessment & Plan:

## 2012-06-11 NOTE — Assessment & Plan Note (Signed)
The patient has persistent shortness of breath after some type of bar or bacterial pulmonary process.  Based on her chest x-ray showing prominent bronchovascular markings, I wonder if she has a post infectious bronchiolitis.  The other possibility with her crackles on exam, is whether she may have occult ground glass densities consistent with bronchiolitis obliterans with organizing pneumonia.  I would like to treat her with a short course of prednisone, and if she sees dramatic improvement, she may need a more prolonged course.  It should be noted, the patient had adequate oxygenation at rest and with aggressive ambulation around the office today.  We'll check an overnight oximetry on CPAP to see if she still needs supplemental oxygen.

## 2012-06-12 ENCOUNTER — Telehealth: Payer: Self-pay | Admitting: Pulmonary Disease

## 2012-06-12 NOTE — Telephone Encounter (Signed)
ONO on room air while wearing the CPAP machine; Jonni Sanger is aware of correct order needed.

## 2012-06-13 ENCOUNTER — Other Ambulatory Visit: Payer: Self-pay | Admitting: *Deleted

## 2012-06-13 ENCOUNTER — Telehealth: Payer: Self-pay | Admitting: Pulmonary Disease

## 2012-06-13 NOTE — Telephone Encounter (Signed)
Opened in Error.

## 2012-06-13 NOTE — Telephone Encounter (Signed)
Rhonda please help.  Pt has not heard from [facility] about her ONO.

## 2012-06-14 ENCOUNTER — Encounter: Payer: Self-pay | Admitting: Internal Medicine

## 2012-06-14 ENCOUNTER — Telehealth: Payer: Self-pay | Admitting: Internal Medicine

## 2012-06-14 ENCOUNTER — Other Ambulatory Visit: Payer: Self-pay | Admitting: *Deleted

## 2012-06-14 NOTE — Telephone Encounter (Signed)
Spoke with patient and she is very upset at [employer]. She states that Dr. Gwenette Greet ordered the ONO on CPAP on 06/11/12 and she just heard from them today, stating that they were going to put device in the mail today. Pt stated that she was calling [employer] to cancel this order and wants [place] to arrange for ONO on CPAP with another company.  Pt stated that she was on o2 by Dr. [name] but is not using it now. Advised patient that she probably needed to let Dr. Velora Mediate office know, that our ov notes can be viewed in Epic by Dr. [name].  Order sent to [facility] for an ONO on CPAP-download and fax report to Dr. Gwenette Greet. Pt is aware. Rhonda J Cobb

## 2012-06-14 NOTE — Telephone Encounter (Signed)
Opened in Error.

## 2012-06-14 NOTE — Telephone Encounter (Signed)
Pt would like to cancel [facility] service and Dr Gwenette Greet took her off of oxygen.

## 2012-06-23 NOTE — Telephone Encounter (Signed)
Ok to follow advice and direction of Dr Gwenette Greet.

## 2012-06-24 NOTE — Telephone Encounter (Signed)
Paperwork filled out to [facility].  Will fax to [facility].

## 2012-06-25 ENCOUNTER — Encounter: Payer: Self-pay | Admitting: Pulmonary Disease

## 2012-06-25 ENCOUNTER — Ambulatory Visit (INDEPENDENT_AMBULATORY_CARE_PROVIDER_SITE_OTHER): Payer: Medicare Other | Admitting: Pulmonary Disease

## 2012-06-25 VITALS — BP 130/72 | HR 62 | Temp 97.9°F | Ht 66.5 in | Wt 250.2 lb

## 2012-06-25 DIAGNOSIS — R0989 Other specified symptoms and signs involving the circulatory and respiratory systems: Secondary | ICD-10-CM

## 2012-06-25 DIAGNOSIS — R0609 Other forms of dyspnea: Secondary | ICD-10-CM

## 2012-06-25 DIAGNOSIS — R0600 Dyspnea, unspecified: Secondary | ICD-10-CM

## 2012-06-25 DIAGNOSIS — G4733 Obstructive sleep apnea (adult) (pediatric): Secondary | ICD-10-CM

## 2012-06-25 NOTE — Patient Instructions (Addendum)
Stay on cpap, work on weight loss Keep up with supplies and mask changes.  Will send order to your dme. followup with me in one year, cancel upcoming appointment in Jan.

## 2012-06-25 NOTE — Assessment & Plan Note (Signed)
The patient feels that she is back to baseline after her recent pneumonia complicated by cough and dyspnea.  She has responded very well to a short course of prednisone, and I suspect she had a postinfectious bronchiolitis.  I've asked her to call if she has any further breathing issues.

## 2012-06-25 NOTE — Progress Notes (Signed)
  Subjective:    Patient ID: [name], female    DOB: [birth year], 66 y.o.   MRN: [id number]  HPI Patient comes in today for followup after her recent episode of persistent cough and dyspnea after pneumonia.  She was felt to have post infectious bronchiolitis, and was treated with a short course of prednisone.  The patient has had dramatic improvement in her breathing, and feels that she is back to baseline.  She has no further cough or mucus.  She has also been wearing her CPAP compliantly, and feels she sleeps well with the device.  She is overdue for new supplies and a mask.   Review of Systems  Constitutional: Negative for fever and unexpected weight change.  HENT: Positive for congestion. Negative for ear pain, nosebleeds, sore throat, rhinorrhea, sneezing, trouble swallowing, dental problem, postnasal drip and sinus pressure.   Eyes: Negative for redness and itching.  Respiratory: Negative for cough, chest tightness, shortness of breath and wheezing.   Cardiovascular: Negative for palpitations and leg swelling.  Gastrointestinal: Negative for nausea and vomiting.  Genitourinary: Negative for dysuria.  Musculoskeletal: Negative for joint swelling.  Skin: Negative for rash.  Neurological: Negative for headaches.  Hematological: Does not bruise/bleed easily.  Psychiatric/Behavioral: Negative for dysphoric mood. The patient is not nervous/anxious.        Objective:   Physical Exam Overweight female in no acute distress Nose without purulence or discharge noted No skin breakdown or pressure necrosis from the CPAP mask Chest clear to auscultation Lower extremities without edema, no cyanosis Alert and oriented, moves all 4 extremities.       Assessment & Plan:

## 2012-06-25 NOTE — Assessment & Plan Note (Signed)
The patient is doing well from a CPAP standpoint, but just needs to keep up with supplies and asked changes.  Also encouraged her to work aggressively on the right loss.

## 2012-07-02 ENCOUNTER — Ambulatory Visit: Payer: Medicare Other | Admitting: Pulmonary Disease

## 2012-07-03 DIAGNOSIS — J189 Pneumonia, unspecified organism: Secondary | ICD-10-CM

## 2012-07-03 HISTORY — DX: Pneumonia, unspecified organism: J18.9

## 2012-07-04 ENCOUNTER — Ambulatory Visit: Payer: Medicare Other | Admitting: Pulmonary Disease

## 2012-07-09 ENCOUNTER — Telehealth: Payer: Self-pay | Admitting: Cardiovascular Disease

## 2012-07-09 MED ORDER — METOPROLOL SUCCINATE ER 100 MG PO TB24: ORAL_TABLET | ORAL | Status: DC

## 2012-07-09 NOTE — Telephone Encounter (Signed)
NEED APPOINTMENT Fax Received. Refill Completed. Eliyah Mcshea Chowoe (R.M.A)   

## 2012-07-09 NOTE — Telephone Encounter (Signed)
Pt needs refill of metoprolol [facility] [address]--has new insurance so has to change pharmacies please change in system

## 2012-07-15 ENCOUNTER — Encounter: Payer: Self-pay | Admitting: Cardiovascular Disease

## 2012-07-15 ENCOUNTER — Ambulatory Visit (INDEPENDENT_AMBULATORY_CARE_PROVIDER_SITE_OTHER): Payer: Medicare PPO | Admitting: Cardiovascular Disease

## 2012-07-15 VITALS — BP 136/86 | HR 68 | Ht 66.5 in | Wt 251.0 lb

## 2012-07-15 DIAGNOSIS — R600 Localized edema: Secondary | ICD-10-CM

## 2012-07-15 DIAGNOSIS — R0609 Other forms of dyspnea: Secondary | ICD-10-CM

## 2012-07-15 DIAGNOSIS — R609 Edema, unspecified: Secondary | ICD-10-CM

## 2012-07-15 DIAGNOSIS — R0600 Dyspnea, unspecified: Secondary | ICD-10-CM

## 2012-07-15 DIAGNOSIS — I2510 Atherosclerotic heart disease of native coronary artery without angina pectoris: Secondary | ICD-10-CM

## 2012-07-15 DIAGNOSIS — R0989 Other specified symptoms and signs involving the circulatory and respiratory systems: Secondary | ICD-10-CM

## 2012-07-15 DIAGNOSIS — I4891 Unspecified atrial fibrillation: Secondary | ICD-10-CM

## 2012-07-15 NOTE — Assessment & Plan Note (Addendum)
[name] is doing well from a cardiac standpoint. She's not having episodes of angina. She does have some leg edema and I would like to do an echocardiogram.  She has some mild leg edema today. She has not been eating extra salt. I told her at the solutions to her leg edema will involve walking, leg elevation, and compression hose. I showed her picture of how to elevate her legs using the lounge Dr. leg rest. I encouraged her to purchase 1 from Dr. Scot Dock if needed.  I will see her again in one year.

## 2012-07-15 NOTE — Progress Notes (Addendum)
[name] Date of Birth  07/24/1945 [facility] [address]    [place] [name], [name]  [phone]  Fax  [phone]   Problem list: 1. Atrial fibrillation 2. Status post pacemaker implantation 3. Coronary artery disease-status post PTCA and stenting of her right coronary artery 4. Hyperlipidemia 5. Hypertension 6. Obstructive sleep apnea-currently on CPAP  History of Present Illness:  [name] is a 67 year old female with a history of atrial fibrillation. She status post pacemaker implantation. She also has a history of coronary artery disease and is status post PTCA and stenting of the right coronary artery. She also has a history of hyperlipidemia, hypertension, and sleep apnea.  She uses a CPAP mask at night.  The CPAP mask has really helped her stay in normal sinus rhythm.  She complains of having some episodes of chest tightness. This occurs at times. One episode was relieved with sublingual nitroglycerin. She states that it is not nearly as severe as her previous episodes of angina.  January 13, 21014: She was hospitalized in November with severe shortness breath and "double pneumonia". She is slowly recovering.  She's looking forward to  getting back into her exercise program.  She has not been exercising at all.  She's had some leg edema.  She denies any chest pain or shortness breath.  She's has mild leg swelling for the past several months. She does not add salt any of her food. She tries to stay away from salty foods and processed meat.   Current Outpatient Prescriptions on File Prior to Visit  Medication Sig Dispense Refill  . aspirin 325 MG tablet 325 mg. Take one tablet by mouth once daily.       [name] atorvastatin (LIPITOR) 40 MG tablet Take 1 tablet (40 mg total) by mouth daily.  90 tablet  0  . b complex vitamins tablet Take 1 tablet by mouth daily.      . dabigatran (PRADAXA) 150 MG CAPS Take 1 capsule (150 mg total) by mouth every 12 (twelve)  hours.  180 capsule  0  . dofetilide (TIKOSYN) 250 MCG capsule Take 1 capsule (250 mcg total) by mouth 2 (two) times daily.  180 capsule  0  . FLUoxetine (PROZAC) 20 MG capsule Take 1 capsule (20 mg total) by mouth daily. One capsule every morning  90 capsule  3  . indapamide (LOZOL) 2.5 MG tablet Take 1/2 tab daily  45 tablet  3  . losartan (COZAAR) 100 MG tablet Take 1 tablet (100 mg total) by mouth daily.  90 tablet  0  . Lysine HCl 1000 MG TABS as needed.        . metoprolol succinate (TOPROL-XL) 100 MG 24 hr tablet Take one tablet in the morning and 1/2 tablet in the evening  45 tablet  1  . Multiple Vitamin (MULTIVITAMIN) tablet Take 1 tablet by mouth daily.        . NON FORMULARY Calcium carbonate powder Take one tablet by mouth once daily.       . NON FORMULARY at bedtime. cpap       . potassium chloride SA (K-DUR,KLOR-CON) 20 MEQ tablet Take 1 tablet (20 mEq total) by mouth daily. One every AM  90 tablet  3  . predniSONE (DELTASONE) 20 MG tablet Take 2 tablets by mouth x 2 days, 1.5 x 2 days, 1 x 2 days, 1/2 x 2 days then stop.  10 tablet  0  . valACYclovir (VALTREX) 1000 MG  tablet Take 2 by mouth and repeat in 12 hours for cold sores       . vitamin B-12 (CYANOCOBALAMIN) 100 MCG tablet Take 50 mcg by mouth daily.      [name] VITAMIN D PO Take 1,000 mg by mouth daily.        . vitamin E 1000 UNIT capsule Take 1,000 Units by mouth daily.        No Known Allergies  Past Medical History  Diagnosis Date  . History of CVA (cerebrovascular accident)   . CAD (coronary artery disease) [id number]    post PTCA with bare-metal stenting to mid RCA in December 2004     . Hypertension   . Depression   . Chronic atrial fibrillation 06/2007    Tachybradycardia pacemaker  . Pacemaker   . OSA on CPAP   . Anticoagulant long-term use     pradaxa  . Transfusion history   . Hyperlipidemia   . Edema of lower extremity   . History of acute inferior wall MI   . Anxiety   . Dyslipidemia   . Diplopia  06/19/2008    Qualifier: Diagnosis of  By: [name] MD, [name]   . Hemorrhoids     Past Surgical History  Procedure Date  . Total abdominal hysterectomy 1984    2 surgeries endometriosis bso  . Permanent pacemaker 06/2007  . Back surgeries     x2-3 by Dr Trenton Gammon  . Coronary stent placement     C stent  . Cholecystectomy 1984  . Coronary angioplasty with stent placement 1998  . Doppler echocardiography 2009    History  Smoking status  . Former Smoker -- 1.0 packs/day for 5 years  . Types: Cigarettes  . Start date: 05/11/1978  . Quit date: 07/03/1982  Smokeless tobacco  . Not on file    History  Alcohol Use  . Yes    Comment: once q 6 months-socailly    Family History  Problem Relation Age of Onset  . Suicidality Father     suicide death pt was 3 yrs  . Arrhythmia Mother     heart arrthymia  . Hypertension Mother   . Heart attack Brother     died age 52  . Heart disease Paternal Aunt   . Cancer Maternal Grandfather     prostate    Reviw of Systems:  Reviewed in the HPI.  All other systems are negative.  Physical Exam: BP 136/86  Pulse 68  Ht 5' 6.5" (1.689 m)  Wt 251 lb (113.853 kg)  BMI 39.91 kg/m2 The patient is alert and oriented x 3.  The mood and affect are normal.   Skin: warm and dry.  Color is normal.    HEENT:   She has normal carotids. There is no JVD. Her mucous membranes are moist.  Lungs: Lung exam is clear.   Heart: Regular rate S1-S2.    Abdomen: Her abdomen is soft. She has good bowel sounds.  Extremities:  She has 1+ ankle edema.  Neuro:  Her gait is normal. Her neuro exam is nonfocal.    ECG: Jan. 13, 2014:  Normal sinus rhythm at 60 beats a minute.  She has nonspecific ST and T wave abnormalities.  Assessment / Plan:

## 2012-07-15 NOTE — Assessment & Plan Note (Signed)
She is in normal sinus rhythm today.

## 2012-07-15 NOTE — Patient Instructions (Addendum)
Your physician wants you to follow-up in: 1 year  You will receive a reminder letter in the mail two months in advance. If you don't receive a letter, please call our office to schedule the follow-up appointment.  Your physician has requested that you have an echocardiogram. Echocardiography is a painless test that uses sound waves to create images of your heart. It provides your doctor with information about the size and shape of your heart and how well your heart's chambers and valves are working. This procedure takes approximately one hour. There are no restrictions for this procedure.  REDUCE HIGH SODIUM FOODS LIKE CANNED SOUP, GRAVY, SAUCES, READY PREPARED FOODS LIKE FROZEN FOODS; LEAN CUISINE, LASAGNA. BACON, SAUSAGE, LUNCH MEAT, FAST FOODS.[name]

## 2012-07-16 ENCOUNTER — Encounter: Payer: Self-pay | Admitting: *Deleted

## 2012-07-18 ENCOUNTER — Encounter: Payer: Self-pay | Admitting: Internal Medicine

## 2012-07-18 ENCOUNTER — Ambulatory Visit (INDEPENDENT_AMBULATORY_CARE_PROVIDER_SITE_OTHER): Payer: Medicare PPO | Admitting: Internal Medicine

## 2012-07-18 VITALS — BP 169/79 | HR 66 | Ht 66.5 in | Wt 250.8 lb

## 2012-07-18 DIAGNOSIS — I495 Sick sinus syndrome: Secondary | ICD-10-CM

## 2012-07-18 DIAGNOSIS — J189 Pneumonia, unspecified organism: Secondary | ICD-10-CM

## 2012-07-18 DIAGNOSIS — Z950 Presence of cardiac pacemaker: Secondary | ICD-10-CM

## 2012-07-18 DIAGNOSIS — I4891 Unspecified atrial fibrillation: Secondary | ICD-10-CM

## 2012-07-18 LAB — PACEMAKER DEVICE OBSERVATION
AL AMPLITUDE: 1.2 mv
AL IMPEDENCE PM: 474 Ohm
AL THRESHOLD: 0.5 V
ATRIAL PACING PM: 1
BAMS-0001: 150 {beats}/min
BAMS-0003: 70 {beats}/min
BATTERY VOLTAGE: 2.79 V
DEVICE MODEL PM: 1999089
RV LEAD AMPLITUDE: 12 mv
RV LEAD IMPEDENCE PM: 416 Ohm
RV LEAD THRESHOLD: 1.25 V
VENTRICULAR PACING PM: 1

## 2012-07-18 NOTE — Assessment & Plan Note (Signed)
She is currently maintaining sinus rhythm very nicely. Pacemaker interrogation demonstrates no episodes of atrial fibrillation. She will continue dofetilide.

## 2012-07-18 NOTE — Patient Instructions (Signed)
Your physician wants you to follow-up in: 12 months with Dr. Taylor. You will receive a reminder letter in the mail two months in advance. If you don't receive a letter, please call our office to schedule the follow-up appointment.    

## 2012-07-18 NOTE — Assessment & Plan Note (Signed)
Her pacemaker is working normally. We'll plan to recheck in several months.

## 2012-07-18 NOTE — Progress Notes (Signed)
HPI [name] returns today for followup. She is a very pleasant 67 year old woman with coronary artery disease, stroke, paroxysmal atrial fibrillation, symptomatic tachycardia bradycardia syndrome, status post permanent pacemaker insertion. With a combination of CPAP and dofetilide, she is maintaining sinus rhythm very nicely. She denies palpitations. No chest pain or shortness of breath. She does have peripheral edema, and admits to sodium indiscretion, which she is trying to change. In addition, she is trying to lose weight. No Known Allergies   Current Outpatient Prescriptions  Medication Sig Dispense Refill  . aspirin 325 MG tablet 325 mg. Take one tablet by mouth once daily.       [name] atorvastatin (LIPITOR) 40 MG tablet Take 1 tablet (40 mg total) by mouth daily.  90 tablet  0  . b complex vitamins tablet Take 1 tablet by mouth daily.      . dabigatran (PRADAXA) 150 MG CAPS Take 1 capsule (150 mg total) by mouth every 12 (twelve) hours.  180 capsule  0  . dofetilide (TIKOSYN) 250 MCG capsule Take 1 capsule (250 mcg total) by mouth 2 (two) times daily.  180 capsule  0  . FLUoxetine (PROZAC) 20 MG capsule Take 1 capsule (20 mg total) by mouth daily. One capsule every morning  90 capsule  3  . indapamide (LOZOL) 2.5 MG tablet Take 1/2 tab daily  45 tablet  3  . losartan (COZAAR) 100 MG tablet Take 1 tablet (100 mg total) by mouth daily.  90 tablet  0  . Lysine HCl 1000 MG TABS as needed.        . metoprolol succinate (TOPROL-XL) 100 MG 24 hr tablet Take one tablet in the morning and 1/2 tablet in the evening  45 tablet  1  . Multiple Vitamin (MULTIVITAMIN) tablet Take 1 tablet by mouth daily.        . nitroGLYCERIN (NITROSTAT) 0.4 MG SL tablet Place 0.4 mg under the tongue every 5 (five) minutes as needed.      . NON FORMULARY Calcium carbonate powder Take one tablet by mouth once daily.       . NON FORMULARY at bedtime. cpap       . potassium chloride SA (K-DUR,KLOR-CON) 20 MEQ tablet Take 1  tablet (20 mEq total) by mouth daily. One every AM  90 tablet  3  . predniSONE (DELTASONE) 20 MG tablet Take 2 tablets by mouth x 2 days, 1.5 x 2 days, 1 x 2 days, 1/2 x 2 days then stop.  10 tablet  0  . valACYclovir (VALTREX) 1000 MG tablet Take 2 by mouth and repeat in 12 hours for cold sores       . vitamin B-12 (CYANOCOBALAMIN) 100 MCG tablet Take 50 mcg by mouth daily.      [name] VITAMIN D PO Take 1,000 mg by mouth daily.        . vitamin E 1000 UNIT capsule Take 1,000 Units by mouth daily.         Past Medical History  Diagnosis Date  . History of CVA (cerebrovascular accident)   . CAD (coronary artery disease) [id number]    post PTCA with bare-metal stenting to mid RCA in December 2004     . Hypertension   . Chronic atrial fibrillation 06/2007    Tachybradycardia pacemaker  . Pacemaker   . OSA on CPAP   . Anticoagulant long-term use     pradaxa  . Transfusion history   . Hyperlipidemia   . Edema  of lower extremity   . History of acute inferior wall MI   . Anxiety   . Diplopia 06/19/2008    Qualifier: Diagnosis of  By: [name] MD, [name]   . Unspecified hemorrhoids without mention of complication [id number]    Colonoscopy--Dr. Carlean Purl     ROS:   All systems reviewed and negative except as noted in the HPI.   Past Surgical History  Procedure Date  . Total abdominal hysterectomy 1984    2 surgeries endometriosis bso  . Permanent pacemaker 06/2007  . Back surgeries     x2-3 by Dr Trenton Gammon  . Coronary stent placement     C stent  . Cholecystectomy 1984  . Coronary angioplasty with stent placement 1998  . Doppler echocardiography 2009     Family History  Problem Relation Age of Onset  . Suicidality Father     suicide death pt was 3 yrs  . Arrhythmia Mother     heart arrthymia  . Hypertension Mother   . Heart attack Brother     died age 40  . Heart disease Paternal Aunt   . Cancer Maternal Grandfather     prostate     History   Social History  . Marital  Status: Married    Spouse Name: N/A    Number of Children: 0  . Years of Education: HS   Occupational History  . retired     previously worked [employer]  . Smoking status: Former Smoker -- 1.0 packs/day for 5 years    Types: Cigarettes    Start date: 05/11/1978    Quit date: 07/03/1982  . Smokeless tobacco: Not on file  . Alcohol Use: Yes     Comment: once q 6 months-socailly  . Drug Use: No  . Sexually Active: Not on file   Other Topics Concern  . 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 HEART:  Regular rate rhythm, no murmurs, no rubs, no clicks Abd:  soft, positive bowel sounds, no organomegally, no rebound, no guarding Ext:  2 plus pulses, no edema, no cyanosis, no clubbing Skin:  No rashes no nodules Neuro:  CN II through XII intact, motor grossly intact  DEVICE  Normal device function.  See PaceArt for details.   Assess/Plan:

## 2012-07-18 NOTE — Assessment & Plan Note (Signed)
She developed pneumonia several months ago, but her symptoms have currently resolved on antibiotic therapy.

## 2012-07-19 ENCOUNTER — Other Ambulatory Visit (HOSPITAL_COMMUNITY): Payer: Medicare PPO

## 2012-07-19 ENCOUNTER — Ambulatory Visit (HOSPITAL_COMMUNITY): Payer: Medicare PPO | Attending: Internal Medicine | Admitting: Radiology

## 2012-07-19 DIAGNOSIS — I2510 Atherosclerotic heart disease of native coronary artery without angina pectoris: Secondary | ICD-10-CM | POA: Insufficient documentation

## 2012-07-19 DIAGNOSIS — R0609 Other forms of dyspnea: Secondary | ICD-10-CM | POA: Insufficient documentation

## 2012-07-19 DIAGNOSIS — I4891 Unspecified atrial fibrillation: Secondary | ICD-10-CM | POA: Insufficient documentation

## 2012-07-19 DIAGNOSIS — I10 Essential (primary) hypertension: Secondary | ICD-10-CM | POA: Insufficient documentation

## 2012-07-19 DIAGNOSIS — Z87891 Personal history of nicotine dependence: Secondary | ICD-10-CM | POA: Insufficient documentation

## 2012-07-19 DIAGNOSIS — R0600 Dyspnea, unspecified: Secondary | ICD-10-CM

## 2012-07-19 DIAGNOSIS — R0989 Other specified symptoms and signs involving the circulatory and respiratory systems: Secondary | ICD-10-CM | POA: Insufficient documentation

## 2012-07-19 DIAGNOSIS — R600 Localized edema: Secondary | ICD-10-CM

## 2012-07-19 DIAGNOSIS — E785 Hyperlipidemia, unspecified: Secondary | ICD-10-CM | POA: Insufficient documentation

## 2012-07-19 NOTE — Progress Notes (Signed)
Echocardiogram performed.  

## 2012-07-23 ENCOUNTER — Encounter: Payer: Self-pay | Admitting: Gastroenterology

## 2012-07-23 ENCOUNTER — Ambulatory Visit (INDEPENDENT_AMBULATORY_CARE_PROVIDER_SITE_OTHER): Payer: Medicare PPO | Admitting: Gastroenterology

## 2012-07-23 VITALS — BP 124/70 | HR 62 | Ht 66.0 in | Wt 249.5 lb

## 2012-07-23 DIAGNOSIS — Z8673 Personal history of transient ischemic attack (TIA), and cerebral infarction without residual deficits: Secondary | ICD-10-CM

## 2012-07-23 DIAGNOSIS — Z7901 Long term (current) use of anticoagulants: Secondary | ICD-10-CM

## 2012-07-23 DIAGNOSIS — K644 Residual hemorrhoidal skin tags: Secondary | ICD-10-CM

## 2012-07-23 DIAGNOSIS — Z9089 Acquired absence of other organs: Secondary | ICD-10-CM

## 2012-07-23 DIAGNOSIS — I4891 Unspecified atrial fibrillation: Secondary | ICD-10-CM

## 2012-07-23 DIAGNOSIS — K625 Hemorrhage of anus and rectum: Secondary | ICD-10-CM

## 2012-07-23 DIAGNOSIS — Z950 Presence of cardiac pacemaker: Secondary | ICD-10-CM

## 2012-07-23 DIAGNOSIS — Z9049 Acquired absence of other specified parts of digestive tract: Secondary | ICD-10-CM

## 2012-07-23 MED ORDER — HYDROCORTISONE ACE-PRAMOXINE 2.5-1 % RE CREA: TOPICAL_CREAM | RECTAL | Status: DC

## 2012-07-23 NOTE — Progress Notes (Signed)
History of Present Illness:  This is a extremely complex 67 year old Caucasian female with multiple, multiple cardiac problems.  She is referred to GI today for evaluation of intermittent bright red blood per rectum over the last 6 months associated with straining and irritation of external hemorrhoids.  She had a negative colonoscopy in January 2005 by Dr. Arelia Longest except for external hemorrhoids.  Currently she has bowel or regularity with straining but denies melena or maroon stools.  Is no upper GI or hepatobiliary complaints.  Her cardiac history is very involved and resolves around coronary artery disease, previous stenting, chronic anticoagulation, previous CVA, atrial fibrillation with pacemaker, and history of COPD.  She is on multiple medications listed and reviewed her chart which include aspirin and Pradaxa.  She also has chronic anxiety and depression is on Prozac 20 mg a day Toprol 200 mg a day and Tikosyn 250 mg twice a day along with Lipitor 40 mg a day.  She currently denies any cardiovascular or pulmonary complaints but has limited exercise tolerance.  She denies any symptoms of angina pectoris.  She has not had previous endoscopy, and is no history of liver disease, hepatitis or pancreatitis.  She follows a regular diet and denies any food intolerances.  Family history is noncontributory.  I have reviewed this patient's present history, medical and surgical past history, allergies and medications.     ROS:   All systems were reviewed and are negative unless otherwise stated in the HPI... is post hysterectomy, has a history of sleep apnea, obesity, hypertension, had previous cholecystectomy.    Physical Exam: Blood pressure 124/70, pulse 62 and regular, and weight 249 the BMI of 40.27.  96% oxygen saturation on room air.  General well developed well nourished patient in no acute distress, appearing their stated age Eyes PERRLA, no icterus, fundoscopic exam per opthamologist Skin no  lesions noted Neck supple, no adenopathy, no thyroid enlargement, no tenderness Chest clear to percussion and auscultation Heart no significant murmurs, gallops or rubs noted Abdomen no hepatosplenomegaly masses or tenderness, BS normal.  Rectal inspection normal no fissures, or fistulae noted.  No masses or tenderness on digital exam. Stool guaiac negative.ANOSCOPY... anoscopic exam showed no other abnormalities such as fissures, fistula, or internal hemorrhoids.  Extra amylase and piles noted. Extremities no acute joint lesions, edema, phlebitis or evidence of cellulitis. Neurologic patient oriented x 3, cranial nerves intact, no focal neurologic deficits noted. Psychological mental status normal and normal affect.  Assessment and plan: This patient extremely poor candidate for any type of conscious sedation and would be at risk for complications with discontinuation of anticoagulation.  I personally would not performed colonoscopy last absolute necessary.  I've scheduled her for CT colonoscopy exam,and we will proceed accordingly.  I have placed her on high fiber diet with daily Metamucil, liberal by mouth fluids, and when necessary Analpram cream locally to her rectum.  Patient concerning hemorrhoids given to the patient.  She otherwise is to continue her medications as listed and reviewed.  Encounter Diagnosis  Name Primary?  . Rectal bleeding Yes

## 2012-07-23 NOTE — Patient Instructions (Signed)
We have sent the following medications to your pharmacy for you to pick up at your convenience: Analpram, please use as directed.  Please purchase Metamucil over the counter. Take as directed.  Information on High Fiber Diet listed below.  You will get a call from me or [facility] regarding your Virtual CT Colonoscopy. Address and phone number at [place] is listed below. [address], [place] [address] [phone] ______________________________________________________________________________________________________________________  High-Fiber Diet Fiber is found in fruits, vegetables, and grains. A high-fiber diet encourages the addition of more whole grains, legumes, fruits, and vegetables in your diet. The recommended amount of fiber for adult males is 38 g per day. For adult females, it is 25 g per day. Pregnant and lactating women should get 28 g of fiber per day. If you have a digestive or bowel problem, ask your caregiver for advice before adding high-fiber foods to your diet. Eat a variety of high-fiber foods instead of only a select few type of foods.  PURPOSE  To increase stool bulk.  To make bowel movements more regular to prevent constipation.  To lower cholesterol.  To prevent overeating. WHEN IS THIS DIET USED?  It may be used if you have constipation and hemorrhoids.  It may be used if you have uncomplicated diverticulosis (intestine condition) and irritable bowel syndrome.  It may be used if you need help with weight management.  It may be used if you want to add it to your diet as a protective measure against atherosclerosis, diabetes, and cancer. SOURCES OF FIBER  Whole-grain breads and cereals.  Fruits, such as apples, oranges, bananas, berries, prunes, and pears.  Vegetables, such as green peas, carrots, sweet potatoes, beets, broccoli, cabbage, spinach, and artichokes.  Legumes, such split peas, soy,  lentils.  Almonds. FIBER CONTENT IN FOODS Starches and Grains / Dietary Fiber (g)  Cheerios, 1 cup / 3 g  Corn Flakes cereal, 1 cup / 0.7 g  Rice crispy treat cereal, 1 cup / 0.3 g  Instant oatmeal (cooked),  cup / 2 g  Frosted wheat cereal, 1 cup / 5.1 g  Brown, long-grain rice (cooked), 1 cup / 3.5 g  White, long-grain rice (cooked), 1 cup / 0.6 g  Enriched macaroni (cooked), 1 cup / 2.5 g Legumes / Dietary Fiber (g)  Baked beans (canned, plain, or vegetarian),  cup / 5.2 g  Kidney beans (canned),  cup / 6.8 g  Pinto beans (cooked),  cup / 5.5 g Breads and Crackers / Dietary Fiber (g)  Plain or honey graham crackers, 2 squares / 0.7 g  Saltine crackers, 3 squares / 0.3 g  Plain, salted pretzels, 10 pieces / 1.8 g  Whole-wheat bread, 1 slice / 1.9 g  White bread, 1 slice / 0.7 g  Raisin bread, 1 slice / 1.2 g  Plain bagel, 3 oz / 2 g  Flour tortilla, 1 oz / 0.9 g  Corn tortilla, 1 small / 1.5 g  Hamburger or hotdog bun, 1 small / 0.9 g Fruits / Dietary Fiber (g)  Apple with skin, 1 medium / 4.4 g  Sweetened applesauce,  cup / 1.5 g  Banana,  medium / 1.5 g  Grapes, 10 grapes / 0.4 g  Orange, 1 small / 2.3 g  Raisin, 1.5 oz / 1.6 g  Melon, 1 cup / 1.4 g Vegetables / Dietary Fiber (g)  Green beans (canned),  cup / 1.3 g  Carrots (cooked),  cup / 2.3 g  Broccoli (cooked),  cup / 2.8 g  Peas (cooked),  cup / 4.4 g  Mashed potatoes,  cup / 1.6 g  Lettuce, 1 cup / 0.5 g  Corn (canned),  cup / 1.6 g  Tomato,  cup / 1.1 g Document Released: 06/19/2005 Document Revised: 12/19/2011 Document Reviewed: 09/21/2011 [facility] Patient Information 2013 [place], [place].

## 2012-08-01 ENCOUNTER — Encounter: Payer: Self-pay | Admitting: Internal Medicine

## 2012-08-05 ENCOUNTER — Ambulatory Visit
Admission: RE | Admit: 2012-08-05 | Discharge: 2012-08-05 | Disposition: A | Discharge status: Home / Self Care | Payer: Medicare PPO | Source: Ambulatory Visit | Attending: Gastroenterology | Admitting: Gastroenterology

## 2012-08-05 DIAGNOSIS — K625 Hemorrhage of anus and rectum: Secondary | ICD-10-CM

## 2012-08-06 ENCOUNTER — Other Ambulatory Visit: Payer: Self-pay | Admitting: Internal Medicine

## 2012-08-19 ENCOUNTER — Other Ambulatory Visit: Payer: Self-pay | Admitting: Internal Medicine

## 2012-08-21 ENCOUNTER — Other Ambulatory Visit: Payer: Self-pay | Admitting: Family Medicine

## 2012-08-21 ENCOUNTER — Other Ambulatory Visit: Payer: Self-pay | Admitting: Internal Medicine

## 2012-09-10 ENCOUNTER — Other Ambulatory Visit: Payer: Self-pay | Admitting: *Deleted

## 2012-09-10 MED ORDER — DABIGATRAN ETEXILATE MESYLATE 150 MG PO CAPS: 150.0000 mg | ORAL_CAPSULE | Freq: Two times a day (BID) | ORAL | Status: DC

## 2012-09-10 MED ORDER — METOPROLOL SUCCINATE ER 100 MG PO TB24: ORAL_TABLET | ORAL | Status: DC

## 2012-09-10 MED ORDER — DOFETILIDE 250 MCG PO CAPS: 250.0000 ug | ORAL_CAPSULE | Freq: Two times a day (BID) | ORAL | Status: DC

## 2012-09-10 NOTE — Telephone Encounter (Signed)
Fax Received. Refill Completed. [name] (R.M.A)   

## 2012-09-10 NOTE — Telephone Encounter (Signed)
Fax Received. Refill Completed. Simeon Vera Chowoe (R.M.A)   

## 2012-09-23 ENCOUNTER — Other Ambulatory Visit: Payer: Self-pay | Admitting: Internal Medicine

## 2012-09-24 ENCOUNTER — Ambulatory Visit (INDEPENDENT_AMBULATORY_CARE_PROVIDER_SITE_OTHER): Payer: Medicare PPO | Admitting: Pulmonary Disease

## 2012-09-24 ENCOUNTER — Ambulatory Visit (INDEPENDENT_AMBULATORY_CARE_PROVIDER_SITE_OTHER)
Admission: RE | Admit: 2012-09-24 | Discharge: 2012-09-24 | Disposition: A | Discharge status: Home / Self Care | Payer: Medicare PPO | Source: Ambulatory Visit | Attending: Pulmonary Disease | Admitting: Pulmonary Disease

## 2012-09-24 ENCOUNTER — Encounter: Payer: Self-pay | Admitting: Pulmonary Disease

## 2012-09-24 VITALS — BP 122/82 | HR 65 | Temp 98.2°F | Ht 66.5 in | Wt 253.0 lb

## 2012-09-24 DIAGNOSIS — R0600 Dyspnea, unspecified: Secondary | ICD-10-CM

## 2012-09-24 DIAGNOSIS — R0989 Other specified symptoms and signs involving the circulatory and respiratory systems: Secondary | ICD-10-CM

## 2012-09-24 DIAGNOSIS — R0609 Other forms of dyspnea: Secondary | ICD-10-CM

## 2012-09-24 DIAGNOSIS — G4733 Obstructive sleep apnea (adult) (pediatric): Secondary | ICD-10-CM

## 2012-09-24 NOTE — Assessment & Plan Note (Signed)
The patient has done very well since her last visit, however 3 weeks ago began to notice worsening dyspnea on exertion with no obvious explanation.  She really has no airway symptoms such as cough or wheezing, but does describe a sensation of air trapping.  She does have an inspiratory pop in her left upper lung zone posteriorly, otherwise her lungs were clear.  Her spirometry at the last visit showed no obstruction by FEV1 percent, but there was a decreased forced vital capacity either due to air-trapping or her obesity.  She is having no chest discomfort, and is on chronic anticoagulation.  She feels that her heart rate has been adequately controlled, even with exertional activities.  She has noticed an increase in her lower extremity edema, and her weight is up 3 pounds.  Perhaps this is related to fluid overload.  I would like to check a chest x-ray today for completeness, I would also like to order full pulmonary function studies to put the issue of air flow limitation to rest.

## 2012-09-24 NOTE — Progress Notes (Signed)
  Subjective:    Patient ID: [name], female    DOB: May 20, 1946, 67 y.o.   MRN: [id number]  HPI Patient comes in today for an acute sick visit.  She was seen after a bout of pneumonia with symptoms that were most consistent with a post infectious bronchiolitis.  She responded very well to a course of prednisone, and feels that she return to baseline.  She did well until approximately 3 weeks ago, when she began to notice "out of the blue" increasing dyspnea on exertion.  This is starting to interfere with her activities of daily living.  She has no significant cough, congestion, or mucus.  She has not been wheezing, but does describe air trapping.  She has not had any pressure or pleuritic chest pain, and she feels that her heart rate has been under adequate control.  She has noted increased lower extremity edema with a weight gain.   Review of Systems  Constitutional: Negative for fever and unexpected weight change.  HENT: Positive for congestion, rhinorrhea and postnasal drip. Negative for ear pain, nosebleeds, sore throat, sneezing, trouble swallowing, dental problem and sinus pressure.   Eyes: Negative for redness and itching.  Respiratory: Positive for cough and shortness of breath. Negative for chest tightness and wheezing.   Cardiovascular: Positive for leg swelling. Negative for palpitations.  Gastrointestinal: Negative for nausea and vomiting.  Genitourinary: Negative for dysuria.  Musculoskeletal: Negative for joint swelling.  Skin: Negative for rash.  Neurological: Positive for headaches ( sinus related).  Hematological: Does not bruise/bleed easily.  Psychiatric/Behavioral: Negative for dysphoric mood. The patient is not nervous/anxious.        Objective:   Physical Exam Overweight female in no acute distress Nose without purulence or discharge noted Neck without lymphadenopathy or thyromegaly Chest totally clear except for one isolated inspiratory pop in the left upper  lung zone posteriorly. Cardiac exam with regular rate and rhythm Lower extremities with 1+ edema bilaterally, no cyanosis Alert and oriented, moves all 4 extremities.       Assessment & Plan:

## 2012-09-24 NOTE — Patient Instructions (Addendum)
Will check a chest xray today, and call you with results. Will schedule for full breathing studies. Let Dr. [name] know about your increased edema in your ankles.

## 2012-09-25 ENCOUNTER — Other Ambulatory Visit: Payer: Self-pay | Admitting: *Deleted

## 2012-09-25 MED ORDER — LOSARTAN POTASSIUM 100 MG PO TABS: 100.0000 mg | ORAL_TABLET | Freq: Every day | ORAL | Status: DC

## 2012-09-25 NOTE — Telephone Encounter (Signed)
Fax Received. Refill Completed. [name] (R.M.A)   

## 2012-09-26 ENCOUNTER — Other Ambulatory Visit: Payer: Self-pay | Admitting: Internal Medicine

## 2012-09-30 ENCOUNTER — Other Ambulatory Visit: Payer: Self-pay | Admitting: *Deleted

## 2012-09-30 MED ORDER — POTASSIUM CHLORIDE CRYS ER 20 MEQ PO TBCR: 20.0000 meq | EXTENDED_RELEASE_TABLET | Freq: Every day | ORAL | Status: DC

## 2012-10-07 DIAGNOSIS — I495 Sick sinus syndrome: Secondary | ICD-10-CM

## 2012-11-04 ENCOUNTER — Ambulatory Visit (INDEPENDENT_AMBULATORY_CARE_PROVIDER_SITE_OTHER): Payer: Medicare PPO | Admitting: Internal Medicine

## 2012-11-04 ENCOUNTER — Encounter: Payer: Self-pay | Admitting: Internal Medicine

## 2012-11-04 VITALS — BP 146/82 | HR 69 | Temp 98.3°F | Wt 253.0 lb

## 2012-11-04 DIAGNOSIS — D239 Other benign neoplasm of skin, unspecified: Secondary | ICD-10-CM

## 2012-11-04 DIAGNOSIS — R0609 Other forms of dyspnea: Secondary | ICD-10-CM

## 2012-11-04 DIAGNOSIS — Z7901 Long term (current) use of anticoagulants: Secondary | ICD-10-CM

## 2012-11-04 DIAGNOSIS — I10 Essential (primary) hypertension: Secondary | ICD-10-CM

## 2012-11-04 DIAGNOSIS — R7309 Other abnormal glucose: Secondary | ICD-10-CM

## 2012-11-04 DIAGNOSIS — Z950 Presence of cardiac pacemaker: Secondary | ICD-10-CM

## 2012-11-04 DIAGNOSIS — R609 Edema, unspecified: Secondary | ICD-10-CM

## 2012-11-04 DIAGNOSIS — R0600 Dyspnea, unspecified: Secondary | ICD-10-CM

## 2012-11-04 DIAGNOSIS — R233 Spontaneous ecchymoses: Secondary | ICD-10-CM | POA: Insufficient documentation

## 2012-11-04 DIAGNOSIS — R0989 Other specified symptoms and signs involving the circulatory and respiratory systems: Secondary | ICD-10-CM

## 2012-11-04 DIAGNOSIS — D229 Melanocytic nevi, unspecified: Secondary | ICD-10-CM

## 2012-11-04 LAB — CBC WITH DIFFERENTIAL/PLATELET
Basophils Absolute: 0.1 10*3/uL (ref 0.0–0.1)
Basophils Relative: 1.5 % (ref 0.0–3.0)
Eosinophils Absolute: 0.6 10*3/uL (ref 0.0–0.7)
Eosinophils Relative: 7.1 % — ABNORMAL HIGH (ref 0.0–5.0)
HCT: 31.9 % — ABNORMAL LOW (ref 36.0–46.0)
Hemoglobin: 10.4 g/dL — ABNORMAL LOW (ref 12.0–15.0)
Lymphocytes Relative: 25.3 % (ref 12.0–46.0)
Lymphs Abs: 2.1 10*3/uL (ref 0.7–4.0)
MCHC: 32.7 g/dL (ref 30.0–36.0)
MCV: 78.8 fl (ref 78.0–100.0)
Monocytes Absolute: 0.6 10*3/uL (ref 0.1–1.0)
Monocytes Relative: 7 % (ref 3.0–12.0)
Neutro Abs: 5 10*3/uL (ref 1.4–7.7)
Neutrophils Relative %: 59.1 % (ref 43.0–77.0)
Platelets: 342 10*3/uL (ref 150.0–400.0)
RBC: 4.05 Mil/uL (ref 3.87–5.11)
RDW: 15.7 % — ABNORMAL HIGH (ref 11.5–14.6)
WBC: 8.4 10*3/uL (ref 4.5–10.5)

## 2012-11-04 LAB — POCT URINALYSIS DIPSTICK
Bilirubin, UA: NEGATIVE
Glucose, UA: NEGATIVE
Ketones, UA: NEGATIVE
Leukocytes, UA: NEGATIVE
Nitrite, UA: NEGATIVE
Protein, UA: NEGATIVE
Spec Grav, UA: 1.02
Urobilinogen, UA: 0.2
pH, UA: 7

## 2012-11-04 LAB — HEMOGLOBIN A1C: Hgb A1c MFr Bld: 7 % — ABNORMAL HIGH (ref 4.6–6.5)

## 2012-11-04 NOTE — Progress Notes (Signed)
Chief Complaint  Patient presents with  . Follow-up    Says that Dr. Gwenette Greet says she needs a fluid pill.  She also has a mole on her back that needs to be checked.    HPI:  Patient comes in today for follow up of  multiple medical problems.  Last visit with me was 12 13 for dc pna possible  . Since then has had visits with her specialists  Has seen dr clance  pulmonary [facility] cardiology and [facility] rectal bleeding . Is here today for above? Told her to get a fluid pill from pcp.   Last note Cards  1 13 14   Kida is doing well from a cardiac standpoint. She's not having episodes of angina. She does have some leg edema and I would like to do an echocardiogram.  She has some mild leg edema today. She has not been eating extra salt. I told her at the solutions to her leg edema will involve walking, leg elevation, and compression hose. I showed her picture of how to elevate her legs using the lounge Dr. leg rest. I encouraged her to purchase 1 from Dr. Scot Dock if needed.  I will see her again in one year.     ,ast note Clance  3 14  Will check a chest xray today, and call you with results.  Will schedule for full breathing studies.  Let Dr. [name] know about your increased edema in your ankles.   So  Still has doe sob but no syncope or fever or other changes Also has a mole that bleeds easily   On anticoagulation  ? Not growing    ROS: See pertinent positives and negatives per HPI.  Past Medical History  Diagnosis Date  . History of CVA (cerebrovascular accident)   . CAD (coronary artery disease) [id number]    post PTCA with bare-metal stenting to mid RCA in December 2004     . Hypertension   . Chronic atrial fibrillation 06/2007    Tachybradycardia pacemaker  . Pacemaker   . OSA on CPAP   . Anticoagulant long-term use     pradaxa  . Transfusion history   . Hyperlipidemia   . Edema of lower extremity   . History of acute inferior wall MI   . Anxiety   . Diplopia  06/19/2008    Qualifier: Diagnosis of  By: [name] MD, [name]   . Unspecified hemorrhoids without mention of complication [id number]    Colonoscopy--Dr. Carlean Purl     Family History  Problem Relation Age of Onset  . Suicidality Father     suicide death pt was 3 yrs  . Arrhythmia Mother     heart arrthymia  . Hypertension Mother   . Heart attack Brother     died age 67  . Heart disease Paternal Aunt   . Prostate cancer Maternal Grandfather   . Diabetes Paternal Grandfather     fathers side of the family  . Diabetes Mother     History   Social History  . Marital Status: Married    Spouse Name: N/A    Number of Children: 0  . Years of Education: HS   Occupational History  . retired     previously worked [employer]  . Smoking status: Former Smoker -- 1.00 packs/day for 5 years    Types: Cigarettes    Start date: 05/11/1978    Quit date: 07/03/1982  . Smokeless  tobacco: Never Used  . Alcohol Use: Yes     Comment: once q 6 months-socailly-wine  . Drug Use: No  . Sexually Active: None   Other Topics Concern  . 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   Married    Originally from [place] of two, high school education   Former smoker [phone]   Hunting dogs 7    Retired from [employer] 2     Outpatient Encounter Prescriptions as of 11/04/2012  Medication Sig Dispense Refill  . aspirin 325 MG tablet 325 mg. Take one tablet by mouth once daily.       [name] atorvastatin (LIPITOR) 40 MG tablet Take 1 tablet (40 mg total) by mouth daily.  90 tablet  0  . b complex vitamins tablet Take 1 tablet by mouth daily.      . dabigatran (PRADAXA) 150 MG CAPS Take 1 capsule (150 mg total) by mouth every 12 (twelve) hours.  180 capsule  3  . dofetilide (TIKOSYN) 250 MCG capsule Take 1 capsule (250 mcg total) by mouth 2 (two) times daily.  180 capsule  5  .  FLUoxetine (PROZAC) 20 MG capsule Take 1 capsule (20 mg total) by mouth daily. One capsule every morning  90 capsule  3  . hydrocortisone-pramoxine (ANALPRAM-HC) 2.5-1 % rectal cream Please use at bedtime  30 g  0  . indapamide (LOZOL) 2.5 MG tablet Take 1/2 tab daily  45 tablet  3  . losartan (COZAAR) 100 MG tablet Take 1 tablet (100 mg total) by mouth daily.  90 tablet  3  . Lysine HCl 1000 MG TABS as needed.        . metoprolol succinate (TOPROL-XL) 100 MG 24 hr tablet Take one tablet in the morning and 1/2 tablet in the evening  45 tablet  5  . Multiple Vitamin (MULTIVITAMIN) tablet Take 1 tablet by mouth daily.        . nitroGLYCERIN (NITROSTAT) 0.4 MG SL tablet Place 0.4 mg under the tongue every 5 (five) minutes as needed.      . NON FORMULARY Calcium carbonate powder Take one tablet by mouth once daily.       . NON FORMULARY at bedtime. cpap       . potassium chloride SA (K-DUR,KLOR-CON) 20 MEQ tablet Take 1 tablet (20 mEq total) by mouth daily. One every AM  90 tablet  3  . valACYclovir (VALTREX) 1000 MG tablet Take 2 by mouth and repeat in 12 hours for cold sores       . vitamin B-12 (CYANOCOBALAMIN) 100 MCG tablet Take 50 mcg by mouth daily.      [name] VITAMIN D PO Take 1,000 mg by mouth daily.        . vitamin E 1000 UNIT capsule Take 1,000 Units by mouth daily.       No facility-administered encounter medications on file as of 11/04/2012.    EXAM:  BP 146/82  Pulse 69  Temp(Src) 98.3 F (36.8 C) (Oral)  Wt 253 lb (114.76 kg)  BMI 40.23 kg/m2  SpO2 94%  Body mass index is 40.23 kg/(m^2).  GENERAL: vitals reviewed and listed above, alert, oriented, appears well hydrated and in no acute distress cognition intact     HEENT: atraumatic, conjunctiva  clear, no obvious abnormalities on inspection of external nose and ears OP : no  lesion edema or exudate   NECK: no obvious masses on inspection palpation  No jvd  LUNGS: clear to auscultation bilaterally, no wheezes, rales or  rhonchi, good air movement Abdomen:  Sof,t normal bowel sounds without hepatosplenomegaly, no guarding rebound or masses no CVA tenderness CV: , no clubbing cyanosis  nl cap refill  1-2 + edema  No ulcers bleeding  Skin irritated moleback mid  thorax no blood; irreg color itchers per pt  No petechia or other sig bruising MS: moves all extremities without noticeable focal  abnormality PSYCH: pleasant and cooperative, no obvious depression or anxiety Wt Readings from Last 3 Encounters:  11/04/12 253 lb (114.76 kg)  09/24/12 253 lb (114.76 kg)  07/23/12 249 lb 8 oz (113.172 kg)    ASSESSMENT AND PLAN:  Discussed the following assessment and plan:  Dyspnea - Plan: Basic metabolic panel, CBC with Differential, Hemoglobin A1c, Hepatic function panel, TSH, POCT urinalysis dipstick  HYPERGLYCEMIA - Plan: Basic metabolic panel, CBC with Differential, Hemoglobin A1c, Hepatic function panel, TSH, POCT urinalysis dipstick  HYPERTENSION - Plan: Basic metabolic panel, CBC with Differential, Hemoglobin A1c, Hepatic function panel, TSH, POCT urinalysis dipstick  Edema - Plan: Basic metabolic panel, CBC with Differential, Hemoglobin A1c, Hepatic function panel, TSH, POCT urinalysis dipstick  Bleeding mole - Plan: Ambulatory referral to Dermatology  PACEMAKER, PERMANENT  Anticoagulant long-term use - Plan: Ambulatory referral to Dermatology Plan fu depending on labs etc  -Patient advised to return or notify health care team  if symptoms worsen or persist or new concerns arise.  Patient Instructions  Will  Refer for the mole check prob needs to be removed.  We can  Adjust your medication to help with  The swelling as a trila Continue lifestyle intervention healthy eating and exercise . Limit  Sodium.   To help also  Will notify you  of labs when availailable  Suggestions about medication changes diuretic et Ronney Asters. Also then plan our followup visit. Fortunately your exam looks good  today.     [name]. Shereena Berquist M.D.  Echo report jan  Left ventricle: The cavity size was normal. Wall thickness was increased in a pattern of mild LVH. Systolic function was normal. The estimated ejection fraction was in the range of 60% to 65%. - Pulmonary arteries: PA peak pressure: 66mm Hg (S).

## 2012-11-04 NOTE — Patient Instructions (Addendum)
Will  Refer for the mole check prob needs to be removed.  We can  Adjust your medication to help with  The swelling as a trila Continue lifestyle intervention healthy eating and exercise . Limit  Sodium.   To help also  Will notify you  of labs when availailable  Suggestions about medication changes diuretic et Ronney Asters. Also then plan our followup visit. Fortunately your exam looks good today.

## 2012-11-05 LAB — HEPATIC FUNCTION PANEL
ALT: 23 U/L (ref 0–35)
AST: 25 U/L (ref 0–37)
Albumin: 3.7 g/dL (ref 3.5–5.2)
Alkaline Phosphatase: 86 U/L (ref 39–117)
Bilirubin, Direct: 0 mg/dL (ref 0.0–0.3)
Total Bilirubin: 0.5 mg/dL (ref 0.3–1.2)
Total Protein: 7.2 g/dL (ref 6.0–8.3)

## 2012-11-05 LAB — BASIC METABOLIC PANEL
BUN: 18 mg/dL (ref 6–23)
CO2: 24 mEq/L (ref 19–32)
Calcium: 9 mg/dL (ref 8.4–10.5)
Chloride: 100 mEq/L (ref 96–112)
Creatinine, Ser: 0.9 mg/dL (ref 0.4–1.2)
GFR: 70.01 mL/min (ref >60.00)
Glucose, Bld: 106 mg/dL — ABNORMAL HIGH (ref 70–99)
Potassium: 5 mEq/L (ref 3.5–5.1)
Sodium: 134 mEq/L — ABNORMAL LOW (ref 135–145)

## 2012-11-05 LAB — TSH: TSH: 1.61 u[IU]/mL (ref 0.35–5.50)

## 2012-11-09 DIAGNOSIS — D485 Neoplasm of uncertain behavior of skin: Secondary | ICD-10-CM | POA: Insufficient documentation

## 2012-11-12 ENCOUNTER — Telehealth: Payer: Self-pay | Admitting: Internal Medicine

## 2012-11-12 ENCOUNTER — Encounter: Payer: Self-pay | Admitting: Internal Medicine

## 2012-11-12 NOTE — Telephone Encounter (Signed)
Pt is supposed to return depending on labs. Pt would like to know results of labs so that she can make follow up appt if needed. Also pt needs follow up on mole removal.

## 2012-11-12 NOTE — Telephone Encounter (Signed)
The following is what i attached to the result note  Inform patient that her labs show blood sugar any early diabetic range and a mild anemia. Neither of these explain the swelling. However they are important. To followup at her next visit  In the meantime have her increase her indapamide diuretic. After clarifying what dose she is taking I think at half of a 2.5 increase to 2.5 mg a day. Then plan followup visit in 2-4 weeks at that time we will recheck her potassium level and hemoglobin and make further plans.      Also I  Didn't put the referral order in until a few days ago  But please check  On status of this  Referral.

## 2012-11-13 ENCOUNTER — Other Ambulatory Visit: Payer: Self-pay | Admitting: Family Medicine

## 2012-11-13 NOTE — Telephone Encounter (Signed)
Results given to the pt

## 2012-11-20 ENCOUNTER — Other Ambulatory Visit: Payer: Self-pay | Admitting: Internal Medicine

## 2012-12-06 ENCOUNTER — Encounter: Payer: Self-pay | Admitting: Cardiovascular Disease

## 2012-12-13 ENCOUNTER — Telehealth: Payer: Self-pay | Admitting: Internal Medicine

## 2012-12-13 ENCOUNTER — Ambulatory Visit (INDEPENDENT_AMBULATORY_CARE_PROVIDER_SITE_OTHER): Payer: Medicare PPO | Admitting: Internal Medicine

## 2012-12-13 ENCOUNTER — Encounter: Payer: Self-pay | Admitting: Internal Medicine

## 2012-12-13 VITALS — BP 130/100 | HR 61 | Temp 98.4°F | Wt 251.0 lb

## 2012-12-13 DIAGNOSIS — R7303 Prediabetes: Secondary | ICD-10-CM

## 2012-12-13 DIAGNOSIS — R0989 Other specified symptoms and signs involving the circulatory and respiratory systems: Secondary | ICD-10-CM

## 2012-12-13 DIAGNOSIS — R609 Edema, unspecified: Secondary | ICD-10-CM

## 2012-12-13 DIAGNOSIS — R0609 Other forms of dyspnea: Secondary | ICD-10-CM

## 2012-12-13 DIAGNOSIS — D649 Anemia, unspecified: Secondary | ICD-10-CM

## 2012-12-13 DIAGNOSIS — Z7901 Long term (current) use of anticoagulants: Secondary | ICD-10-CM

## 2012-12-13 DIAGNOSIS — R0600 Dyspnea, unspecified: Secondary | ICD-10-CM

## 2012-12-13 DIAGNOSIS — R7309 Other abnormal glucose: Secondary | ICD-10-CM

## 2012-12-13 LAB — CBC WITH DIFFERENTIAL/PLATELET
Basophils Absolute: 0.1 10*3/uL (ref 0.0–0.1)
Basophils Relative: 0.8 % (ref 0.0–3.0)
Eosinophils Absolute: 0.7 10*3/uL (ref 0.0–0.7)
Eosinophils Relative: 7 % — ABNORMAL HIGH (ref 0.0–5.0)
HCT: 32.4 % — ABNORMAL LOW (ref 36.0–46.0)
Hemoglobin: 10.4 g/dL — ABNORMAL LOW (ref 12.0–15.0)
Lymphocytes Relative: 23.8 % (ref 12.0–46.0)
Lymphs Abs: 2.5 10*3/uL (ref 0.7–4.0)
MCHC: 32.1 g/dL (ref 30.0–36.0)
MCV: 78.4 fl (ref 78.0–100.0)
Monocytes Absolute: 1 10*3/uL (ref 0.1–1.0)
Monocytes Relative: 9.6 % (ref 3.0–12.0)
Neutro Abs: 6.3 10*3/uL (ref 1.4–7.7)
Neutrophils Relative %: 58.8 % (ref 43.0–77.0)
Platelets: 427 10*3/uL — ABNORMAL HIGH (ref 150.0–400.0)
RBC: 4.14 Mil/uL (ref 3.87–5.11)
RDW: 16.5 % — ABNORMAL HIGH (ref 11.5–14.6)
WBC: 10.7 10*3/uL — ABNORMAL HIGH (ref 4.5–10.5)

## 2012-12-13 LAB — BASIC METABOLIC PANEL
BUN: 16 mg/dL (ref 6–23)
CO2: 27 mEq/L (ref 19–32)
Calcium: 9.8 mg/dL (ref 8.4–10.5)
Chloride: 94 mEq/L — ABNORMAL LOW (ref 96–112)
Creatinine, Ser: 0.9 mg/dL (ref 0.4–1.2)
GFR: 69.99 mL/min (ref >60.00)
Glucose, Bld: 99 mg/dL (ref 70–99)
Potassium: 5 mEq/L (ref 3.5–5.1)
Sodium: 133 mEq/L — ABNORMAL LOW (ref 135–145)

## 2012-12-13 LAB — IBC PANEL
Iron: 26 ug/dL — ABNORMAL LOW (ref 42–145)
Saturation Ratios: 5.6 % — ABNORMAL LOW (ref 20.0–50.0)
Transferrin: 330.7 mg/dL (ref 212.0–360.0)

## 2012-12-13 LAB — FERRITIN: Ferritin: 16.6 ng/mL (ref 10.0–291.0)

## 2012-12-13 MED ORDER — VALACYCLOVIR HCL 1 G PO TABS: 1000.0000 mg | ORAL_TABLET | Freq: Two times a day (BID) | ORAL | Status: DC

## 2012-12-13 MED ORDER — VALACYCLOVIR HCL 1 G PO TABS: ORAL_TABLET | ORAL | Status: DC

## 2012-12-13 MED ORDER — ATORVASTATIN CALCIUM 40 MG PO TABS: ORAL_TABLET | ORAL | Status: DC

## 2012-12-13 NOTE — Progress Notes (Signed)
Chief Complaint  Patient presents with  . Follow-up    Needs refills of Valtrex and atorvastatin.  Continues to have swelling.    HPI: Patient comes in today for follow up of  multiple medical problems.   SOB Dyspnea  about the same  Sleep  Side  because of CPAP. c pap.   Has increased her indapamide  to one pill a day and has noted no significant difference in her legs although slightly better this morning.  Did see the skin specialist and they thought her symptoms were keratotic or warts and no malignancies.   She tries deep better no extra salt but does like bread. Has a family history of diabetes on her father's side her mother is in her 23s and has just been put on medicine for somewhat elevated blood sugar.  No active bleeding is on her blood thinner protects a  Needs a refill over her atorvastatin and Valtrex.  ROS: See pertinent positives and negatives per HPI.  Past Medical History  Diagnosis Date  . History of CVA (cerebrovascular accident)   . CAD (coronary artery disease) [id number]    post PTCA with bare-metal stenting to mid RCA in December 2004     . Hypertension   . Chronic atrial fibrillation 06/2007    Tachybradycardia pacemaker  . Pacemaker   . OSA on CPAP   . Anticoagulant long-term use     pradaxa  . Transfusion history   . Hyperlipidemia   . Edema of lower extremity   . History of acute inferior wall MI   . Anxiety   . Diplopia 06/19/2008    Qualifier: Diagnosis of  By: [name] MD, [name]   . Unspecified hemorrhoids without mention of complication [id number]    Colonoscopy--Dr. Carlean Purl     Family History  Problem Relation Age of Onset  . Suicidality Father     suicide death pt was 3 yrs  . Arrhythmia Mother     heart arrthymia  . Hypertension Mother   . Heart attack Brother     died age 39  . Heart disease Paternal Aunt   . Prostate cancer Maternal Grandfather   . Diabetes Paternal Grandfather     fathers side of the family  . Diabetes  Mother     History   Social History  . Marital Status: Married    Spouse Name: N/A    Number of Children: 0  . Years of Education: HS   Occupational History  . retired     previously worked [employer] [place]  . Smoking status: Former Smoker -- 1.00 packs/day for 5 years    Types: Cigarettes    Start date: 05/11/1978    Quit date: 07/03/1982  . Smokeless tobacco: Never Used  . Alcohol Use: Yes     Comment: once q 6 months-socailly-wine  . Drug Use: No  . Sexually Active: None   Other Topics Concern  . 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   Married    Originally from [place] of two, high school education   Former smoker [phone] 1ppd   Hunting dogs 7    Retired from [employer] 2     Outpatient Encounter Prescriptions as of 12/13/2012  Medication Sig Dispense Refill  . aspirin 325 MG tablet 325 mg. Take one tablet by mouth once  daily.       . atorvastatin (LIPITOR) 40 MG tablet TAKE 1 TABLET EVERY DAY  90 tablet  3  . b complex vitamins tablet Take 1 tablet by mouth daily.      . dabigatran (PRADAXA) 150 MG CAPS Take 1 capsule (150 mg total) by mouth every 12 (twelve) hours.  180 capsule  3  . dofetilide (TIKOSYN) 250 MCG capsule Take 1 capsule (250 mcg total) by mouth 2 (two) times daily.  180 capsule  5  . FLUoxetine (PROZAC) 20 MG capsule Take 1 capsule (20 mg total) by mouth daily. One capsule every morning  90 capsule  3  . hydrocortisone-pramoxine (ANALPRAM-HC) 2.5-1 % rectal cream Please use at bedtime  30 g  0  . indapamide (LOZOL) 2.5 MG tablet Take 2.5 mg by mouth daily.      [name] losartan (COZAAR) 100 MG tablet Take 1 tablet (100 mg total) by mouth daily.  90 tablet  3  . Lysine HCl 1000 MG TABS as needed.        . metoprolol succinate (TOPROL-XL) 100 MG 24 hr tablet Take one tablet in the morning and 1/2 tablet in the evening  45 tablet  5   . Multiple Vitamin (MULTIVITAMIN) tablet Take 1 tablet by mouth daily.        . nitroGLYCERIN (NITROSTAT) 0.4 MG SL tablet Place 0.4 mg under the tongue every 5 (five) minutes as needed.      . NON FORMULARY at bedtime. cpap       . potassium chloride SA (K-DUR,KLOR-CON) 20 MEQ tablet Take 1 tablet (20 mEq total) by mouth daily. One every AM  90 tablet  3  . vitamin B-12 (CYANOCOBALAMIN) 100 MCG tablet Take 50 mcg by mouth daily.      [name] VITAMIN D PO Take 1,000 mg by mouth daily.        . vitamin E 1000 UNIT capsule Take 1,000 Units by mouth daily.      . [DISCONTINUED] atorvastatin (LIPITOR) 40 MG tablet TAKE 1 TABLET EVERY DAY  90 tablet  0  . [DISCONTINUED] valACYclovir (VALTREX) 1000 MG tablet Take 2 by mouth and repeat in 12 hours for cold sores       . [DISCONTINUED] valACYclovir (VALTREX) 1000 MG tablet Take 1 tablet (1,000 mg total) by mouth 2 (two) times daily. Take 2 by mouth and repeat in 12 hours for cold sores  30 tablet  3  . [DISCONTINUED] NON FORMULARY Calcium carbonate powder Take one tablet by mouth once daily.        No facility-administered encounter medications on file as of 12/13/2012.    EXAM:  BP 130/100  Pulse 61  Temp(Src) 98.4 F (36.9 C) (Oral)  Wt 251 lb (113.853 kg)  BMI 39.91 kg/m2  SpO2 94%  Body mass index is 39.91 kg/(m^2).  GENERAL: vitals reviewed and listed above, alert, oriented, appears well hydrated and in no acute distress  HEENT: atraumatic, conjunctiva  clear, no obvious abnormalities on inspection of external nose and ears OP : no lesion edema or exudate  NECK: no obvious masses on inspection palpation  LUNGS: clear to auscultation bilaterally, no wheezes, rales or rhonchi, good air movement CV:  No g or m RR no clubbing cyanosis  nl cap refill   Feet 1+ or less edema  MS: moves all extremities without noticeable focal  abnormality PSYCH: pleasant and cooperative, no obvious depression or anxiety  Wt Readings from Last 3 Encounters:  12/13/12 251 lb (113.853 kg)  11/04/12 253 lb (114.76 kg)  09/24/12 253 lb (114.76 kg)    ASSESSMENT AND PLAN:  Discussed the following assessment and plan:  Edema - Plan: CBC with Differential, IBC panel, Ferritin, Basic metabolic panel  HYPERGLYCEMIA - A1c is now in the diabetic range we'll do referral for nutritional intervention discussed strategies at this time we'll monitor medication yet - Plan: CBC with Differential, IBC panel, Ferritin, Basic metabolic panel, Amb Referral to Nutrition and Diabetic E  Anemia - Plan: CBC with Differential, IBC panel, Ferritin, Basic metabolic panel  Dyspnea  Anticoagulant long-term use  Pre-diabetes - Plan: Amb Referral to Nutrition and Diabetic E Anemia  Recheck today  Dyspnea  No change multifactorial Inc diuretec since last visit  -Patient advised to return or notify health care team  if symptoms worsen or persist or new concerns arise.  Patient Instructions  Will do a referral for diabetes level  Hg a1c.    Consider    Adding metformin is   Ok. With cardiology .  Recheck for anemia and potassium level today. If ok we could call in  Some lasix  To use as needed for a few day s fpor swelling if needed .  Will plan  In 2 -3 months   Labs pre  Visit    [name]. [name] M.D.

## 2012-12-13 NOTE — Patient Instructions (Addendum)
Will do a referral for diabetes level  Hg a1c.    Consider    Adding metformin is   Ok. With cardiology .  Recheck for anemia and potassium level today. If ok we could call in  Some lasix  To use as needed for a few day s fpor swelling if needed .  Will plan  In 2 -3 months   Labs pre  Visit

## 2012-12-13 NOTE — Telephone Encounter (Signed)
Sent by e-scribe. 

## 2012-12-13 NOTE — Telephone Encounter (Signed)
Pharmacist called and stated that they needed better instruction for the pts valACYclovir (VALTREX) 1000 MG tablet. They stated to just resend it, instead of calling back. Please assist.

## 2012-12-23 ENCOUNTER — Encounter: Payer: Self-pay | Admitting: Dietician

## 2012-12-23 ENCOUNTER — Encounter: Payer: Medicare PPO | Attending: Internal Medicine | Admitting: Dietician

## 2012-12-23 VITALS — Ht 66.5 in | Wt 249.7 lb

## 2012-12-23 DIAGNOSIS — D649 Anemia, unspecified: Secondary | ICD-10-CM

## 2012-12-23 DIAGNOSIS — Z713 Dietary counseling and surveillance: Secondary | ICD-10-CM | POA: Insufficient documentation

## 2012-12-23 DIAGNOSIS — E119 Type 2 diabetes mellitus without complications: Secondary | ICD-10-CM | POA: Insufficient documentation

## 2012-12-23 DIAGNOSIS — E669 Obesity, unspecified: Secondary | ICD-10-CM

## 2012-12-23 NOTE — Progress Notes (Signed)
Medical Nutrition Therapy:  Appt start time: 1200 end time:  1300.  Assessment:  Primary concerns today: DM type II.   MEDICATIONS: see list   DIETARY INTAKE:  Usual eating pattern includes 3 meals and 2-3 snacks per day.  Everyday foods include cereal, coffee, fruit.  Avoided foods include regular soda, other sugary drinks.    24-hr recall:  B ( AM): bear naked cereal, milk (1%), coffee (splenda, creamer 2-3 TBSP) Snk ( AM): fruit- grapes, melon  L ( PM): could be fast food like chix nuggets (6) or chix strips (3) or a burger with diet drink; ham and cheese sandwich on whole wheat with mayo and cup of milk or leftovers like spaghetti and chix Snk ( PM): 2-3 crackers and diet soda; fruit, tea sweetened with splenda D ( PM): about half of time she eats out- sub sandwich and a salad (lettuce, tomato, green pepper, cheese, sunflower seeds, bleu cheese dressing, using less dressing lately) Snk ( PM): 100 calorie pack of cookies (will eat half to whole pack) or fruit like grapes Beverages: diet soda, sweet tea from splenda, coffee in AM,   Pt states ice cube pica.   Usual physical activity: not much, pt claims arthritis in both knees, tendonitis and both feet. Pt claims she may purchase home workout equipment.  Progress Towards Goal(s):  In progress.   Nutritional Diagnosis:  NI-5.8.4 Inconsistent carbohydrate intake As related to type II DM.  As evidenced by HgbA1c 7.0, pt reports of prior poor control of CHO portion sizes, high fruit intake.    Intervention:  Nutrition counseling provided regarding pathophysiology of type II DM, importance of CHO control and physical activity in treatment and prevention. RD assigned pt to CHO controlled diet as follows: B: 2 CHO, 2-3 Pro, 2 fat L: 3 CHO, 3 Pro, 2 fat, 1+ Veg D: 3 CHO, 3-4 Pro, 3 fat, 2+ Veg 3 snacks for AM, PM, HS at 1 CHO, 0 fat, 0-1 Pro each  RD discussed symptoms of Zinc and Iron deficiency with patient, and is concerned about  zinc deficiency- pt states dryer scalp and more brittle hair plus ice cube pica. Iron labs were all abnormal except ferritin (anemia of obesity?). RD recommended switching MVI from 38+ female version to regular female version to increase Fe content. RD also recommended a ZnSO4 supplement at 10-20 mg per day. RD advised pt on scheduling MVI for best absorption- ideally 20-30 minutes prior to breakfast for maximal absorption, particularly of Fe.   RD recommended 150 minutes per week of physical activity. Water aerobics, cycling, elliptical, and stationary bike are best options with arthritis.  Handouts given during visit include:  Diabetes basics  CHO control food plan  The Plate Method  Monitoring/Evaluation:  Dietary intake, exercise, portion control, and body weight in 6 week(s).

## 2013-01-06 DIAGNOSIS — I495 Sick sinus syndrome: Secondary | ICD-10-CM

## 2013-01-29 ENCOUNTER — Other Ambulatory Visit: Payer: Self-pay | Admitting: Family Medicine

## 2013-01-29 MED ORDER — INDAPAMIDE 2.5 MG PO TABS: 2.5000 mg | ORAL_TABLET | Freq: Every day | ORAL | Status: DC

## 2013-02-04 ENCOUNTER — Encounter: Payer: Self-pay | Admitting: Dietician

## 2013-02-04 ENCOUNTER — Encounter: Payer: Medicare PPO | Attending: Internal Medicine | Admitting: Dietician

## 2013-02-04 VITALS — Wt 247.5 lb

## 2013-02-04 DIAGNOSIS — Z713 Dietary counseling and surveillance: Secondary | ICD-10-CM | POA: Insufficient documentation

## 2013-02-04 DIAGNOSIS — E669 Obesity, unspecified: Secondary | ICD-10-CM

## 2013-02-04 DIAGNOSIS — E119 Type 2 diabetes mellitus without complications: Secondary | ICD-10-CM | POA: Insufficient documentation

## 2013-02-04 DIAGNOSIS — R7309 Other abnormal glucose: Secondary | ICD-10-CM

## 2013-02-04 NOTE — Progress Notes (Signed)
A: Pt reports today with >2 lbs weight loss since last appointment. She states that she is following the CHO-controlled meal plan well, but it is more difficult when she eats out. She is halving portions when she eats out to control intake.   She has not yet reached goal of 150 minutes per week exercise, but is walking about 30 minutes 3 days per week, and has been more active with some home repairs for her kitchen remodel.   She has made the recommended change to her MVI and changed the timing to before breakfast as recommended. She is not as consistent about her Zinc supplement (at 20 mg), which she takes most days of the week at the same time as her MVI. She still c/o ice cube pica, but now thinks this may be more of a nervous habit than anything else.   I: Continue plan of care. RD recommended pt take Zinc 20 mg daily on empty stomach but not with her MVI to reduce competitive absorption. We will continue this additional iron and zinc until new labs show improvement and/or pica resolves.   RD encouraged pt to increase physical activity as tolerated, with goal of 150 minutes per week, then progressing to 210 minutes per week by the end of the fall.   M/E: Monitor body weight, exercise, iron and zinc status, HgbA1c, adherence to diet plan. F/U in 3 months.

## 2013-02-27 ENCOUNTER — Other Ambulatory Visit (INDEPENDENT_AMBULATORY_CARE_PROVIDER_SITE_OTHER): Payer: Medicare PPO

## 2013-02-27 DIAGNOSIS — R7309 Other abnormal glucose: Secondary | ICD-10-CM

## 2013-02-27 DIAGNOSIS — R739 Hyperglycemia, unspecified: Secondary | ICD-10-CM

## 2013-02-27 LAB — HEMOGLOBIN A1C: Hgb A1c MFr Bld: 6.4 % (ref 4.6–6.5)

## 2013-03-07 ENCOUNTER — Ambulatory Visit (INDEPENDENT_AMBULATORY_CARE_PROVIDER_SITE_OTHER): Payer: Medicare PPO | Admitting: Internal Medicine

## 2013-03-07 ENCOUNTER — Encounter: Payer: Self-pay | Admitting: Internal Medicine

## 2013-03-07 VITALS — BP 140/84 | HR 68 | Temp 98.4°F | Wt 249.0 lb

## 2013-03-07 DIAGNOSIS — R7309 Other abnormal glucose: Secondary | ICD-10-CM

## 2013-03-07 DIAGNOSIS — Z23 Encounter for immunization: Secondary | ICD-10-CM

## 2013-03-07 DIAGNOSIS — D649 Anemia, unspecified: Secondary | ICD-10-CM

## 2013-03-07 LAB — POCT HEMOGLOBIN: Hemoglobin: 12.8 g/dL (ref 12.2–16.2)

## 2013-03-07 NOTE — Progress Notes (Signed)
Chief Complaint  Patient presents with  . Follow-up    bg anemai    HPI: Patient comes in today for follow up of  multiple medical problems.  Since last visit seeing nutrioinist about sugar control  Doing better  Some fluid and edema in am in hands etc .   No bleeding  Feeling well  No fall or change in status ROS: See pertinent positives and negatives per HPI.no change in breathing cp sob etc.  Past Medical History  Diagnosis Date  . History of CVA (cerebrovascular accident)   . CAD (coronary artery disease) [id number]    post PTCA with bare-metal stenting to mid RCA in December 2004     . Hypertension   . Chronic atrial fibrillation 06/2007    Tachybradycardia pacemaker  . Pacemaker   . OSA on CPAP   . Anticoagulant long-term use     pradaxa  . Transfusion history   . Hyperlipidemia   . Edema of lower extremity   . History of acute inferior wall MI   . Anxiety   . Diplopia 06/19/2008    Qualifier: Diagnosis of  By: [name] MD, [name]   . Unspecified hemorrhoids without mention of complication [id number]    Colonoscopy--Dr. Carlean Purl     Family History  Problem Relation Age of Onset  . Suicidality Father     suicide death pt was 3 yrs  . Arrhythmia Mother     heart arrthymia  . Hypertension Mother   . Heart attack Brother     died age 37  . Heart disease Paternal Aunt   . Prostate cancer Maternal Grandfather   . Diabetes Paternal Grandfather     fathers side of the family  . Diabetes Mother     History   Social History  . Marital Status: Married    Spouse Name: N/A    Number of Children: 0  . Years of Education: HS   Occupational History  . retired     previously worked [employer]  . Smoking status: Former Smoker -- 1.00 packs/day for 5 years    Types: Cigarettes    Start date: 05/11/1978    Quit date: 07/03/1982  . Smokeless tobacco: Never Used  . Alcohol Use: Yes     Comment: once q 6 months-socailly-wine  . Drug  Use: No  . Sexual Activity: None   Other Topics Concern  . 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   Married    Originally from [employer] of two, high school education   Former smoker [phone] 1ppd   Hunting dogs 7    Retired from [employer] 2     Outpatient Encounter Prescriptions as of 03/07/2013  Medication Sig Dispense Refill  . aspirin 325 MG tablet 325 mg. Take one tablet by mouth once daily.       [name] atorvastatin (LIPITOR) 40 MG tablet TAKE 1 TABLET EVERY DAY  90 tablet  3  . b complex vitamins tablet Take 1 tablet by mouth daily.      . dabigatran (PRADAXA) 150 MG CAPS Take 1 capsule (150 mg total) by mouth every 12 (twelve) hours.  180 capsule  3  . dofetilide (TIKOSYN) 250 MCG capsule Take 1 capsule (250 mcg total) by mouth 2 (two) times daily.  180 capsule  5  . FLUoxetine (  PROZAC) 20 MG capsule Take 1 capsule (20 mg total) by mouth daily. One capsule every morning  90 capsule  3  . hydrocortisone-pramoxine (ANALPRAM-HC) 2.5-1 % rectal cream Please use at bedtime  30 g  0  . indapamide (LOZOL) 2.5 MG tablet Take 1 tablet (2.5 mg total) by mouth daily.  30 tablet  5  . losartan (COZAAR) 100 MG tablet Take 1 tablet (100 mg total) by mouth daily.  90 tablet  3  . Lysine HCl 1000 MG TABS as needed.        . metoprolol succinate (TOPROL-XL) 100 MG 24 hr tablet Take one tablet in the morning and 1/2 tablet in the evening  45 tablet  5  . Multiple Vitamin (MULTIVITAMIN) tablet Take 1 tablet by mouth daily.        . nitroGLYCERIN (NITROSTAT) 0.4 MG SL tablet Place 0.4 mg under the tongue every 5 (five) minutes as needed.      . NON FORMULARY at bedtime. cpap       . potassium chloride SA (K-DUR,KLOR-CON) 20 MEQ tablet Take 1 tablet (20 mEq total) by mouth daily. One every AM  90 tablet  3  . valACYclovir (VALTREX) 1000 MG tablet Take 2 by mouth and repeat in 12 hours for cold sores  30 tablet   3  . vitamin B-12 (CYANOCOBALAMIN) 100 MCG tablet Take 50 mcg by mouth daily.      [name] VITAMIN D PO Take 5,000 mg by mouth daily.       . vitamin E 1000 UNIT capsule Take 1,000 Units by mouth daily.       No facility-administered encounter medications on file as of 03/07/2013.    EXAM: Wt Readings from Last 3 Encounters:  03/07/13 249 lb (112.946 kg)  02/04/13 247 lb 8 oz (112.265 kg)  12/23/12 249 lb 11.2 oz (113.263 kg)    BP 140/84  Pulse 68  Temp(Src) 98.4 F (36.9 C) (Oral)  Wt 249 lb (112.946 kg)  BMI 39.59 kg/m2  SpO2 96%  Body mass index is 39.59 kg/(m^2).  GENERAL: vitals reviewed and listed above, alert, oriented, appears well hydrated and in no acute distress HEENT: atraumatic, conjunctiva  clear, no obvious abnormalities on inspection of external nose and ears  NECK: no obvious masses on inspection palpation  LUNGS: clear to auscultation bilaterally, no wheezes, rales or rhonchi, good air movement CV: HRRR, no clubbing cyanosis  nl cap refill trc to 1+ edema nl cap refill MS: moves all extremities without noticeable focal  abnormality PSYCH: pleasant and cooperative, no obvious depression or anxiety Lab Results  Component Value Date   WBC 10.7* 12/13/2012   HGB 12.8 03/07/2013   HCT 32.4* 12/13/2012   PLT 427.0* 12/13/2012   GLUCOSE 99 12/13/2012   CHOL 126 05/06/2012   TRIG 106.0 05/06/2012   HDL 45.80 05/06/2012   LDLCALC 59 05/06/2012   ALT 23 11/04/2012   AST 25 11/04/2012   NA 133* 12/13/2012   K 5.0 12/13/2012   CL 94* 12/13/2012   CREATININE 0.9 12/13/2012   BUN 16 12/13/2012   CO2 27 12/13/2012   TSH 1.61 11/04/2012   INR 2.1* 06/10/2008   HGBA1C 6.4 02/27/2013    ASSESSMENT AND PLAN:  Discussed the following assessment and plan:  HYPERGLYCEMIA - prediabetic  better  a1c 6.4  Anemia - on pradaxa no bleeding hx  - Plan: POCT hemoglobin  Need for prophylactic vaccination and inoculation against influenza - Plan: Flu Vaccine QUAD  36+ mos PF IM  (Fluarix)  -Patient advised to return or notify health care team  if symptoms worsen or persist or new concerns arise.  Patient Instructions  Continue lifestyle intervention healthy eating and exercise . Blood sugar a1c test is  Improved and now in the prediabetes range. Weight loss would also help. Continue with nutrition/dietician help.  OV in 4-6 months  Labs pre visit  Lipid liver cbc [id number]   [name]. Loveta Dellis M.D.

## 2013-03-07 NOTE — Patient Instructions (Addendum)
Continue lifestyle intervention healthy eating and exercise . Blood sugar a1c test is  Improved and now in the prediabetes range. Weight loss would also help. Continue with nutrition/dietician help.  OV in 4-6 months  Labs pre visit  Lipid liver cbc [id number]

## 2013-03-14 ENCOUNTER — Other Ambulatory Visit: Payer: Self-pay | Admitting: Cardiovascular Disease

## 2013-03-14 ENCOUNTER — Other Ambulatory Visit: Payer: Self-pay | Admitting: Internal Medicine

## 2013-03-17 ENCOUNTER — Other Ambulatory Visit: Payer: Self-pay

## 2013-03-17 MED ORDER — METOPROLOL SUCCINATE ER 100 MG PO TB24: ORAL_TABLET | ORAL | Status: DC

## 2013-04-07 DIAGNOSIS — I495 Sick sinus syndrome: Secondary | ICD-10-CM

## 2013-04-11 ENCOUNTER — Encounter: Payer: Self-pay | Admitting: Internal Medicine

## 2013-05-12 ENCOUNTER — Ambulatory Visit: Payer: Medicare PPO | Admitting: Dietician

## 2013-06-09 ENCOUNTER — Other Ambulatory Visit: Payer: Self-pay | Admitting: Internal Medicine

## 2013-06-30 ENCOUNTER — Ambulatory Visit: Payer: Medicare Other | Admitting: Pulmonary Disease

## 2013-07-07 ENCOUNTER — Encounter (INDEPENDENT_AMBULATORY_CARE_PROVIDER_SITE_OTHER): Payer: Self-pay

## 2013-07-07 ENCOUNTER — Encounter: Payer: Self-pay | Admitting: Internal Medicine

## 2013-07-07 ENCOUNTER — Encounter: Payer: Self-pay | Admitting: Pulmonary Disease

## 2013-07-07 ENCOUNTER — Ambulatory Visit (INDEPENDENT_AMBULATORY_CARE_PROVIDER_SITE_OTHER): Payer: Medicare HMO | Admitting: Pulmonary Disease

## 2013-07-07 VITALS — BP 112/76 | HR 65 | Temp 97.7°F | Ht 66.5 in | Wt 246.0 lb

## 2013-07-07 DIAGNOSIS — G4733 Obstructive sleep apnea (adult) (pediatric): Secondary | ICD-10-CM

## 2013-07-07 NOTE — Assessment & Plan Note (Signed)
The patient is doing very well with CPAP, and feels that she is sleeping adequately with acceptable daytime alertness. She is having some issues intermittently with the advanced sleep phase syndrome, but is working through it on her on. I will send an order to her home care company for new supplies, and I've also encouraged her to work aggressively on weight loss. She is to followup with me in one year if doing well.

## 2013-07-07 NOTE — Patient Instructions (Signed)
Will send an order to [facility] for new supplies and mask. Keep working on weight loss followup with me in one year if doing well.

## 2013-07-07 NOTE — Progress Notes (Signed)
   Subjective:    Patient ID: [name], female    DOB: 01/18/1946, 68 y.o.   MRN: [id number]  HPI Patient comes in today for followup of her obstructive sleep apnea. She is wearing CPAP compliantly, and is having no issues with her pressure or mask fit. She is overdue for new supplies. Overall, she feels that she sleeps fairly well. She does have an intermittent issues with awakening between 4 and 5 AM and is not able to get back to sleep. The patient's weight is mildly decreased from the last visit.   Review of Systems  Constitutional: Negative for fever and unexpected weight change.  HENT: Negative for congestion, dental problem, ear pain, nosebleeds, postnasal drip, rhinorrhea, sinus pressure, sneezing, sore throat and trouble swallowing.   Eyes: Negative for redness and itching.  Respiratory: Negative for cough, chest tightness, shortness of breath and wheezing.   Cardiovascular: Negative for palpitations and leg swelling.  Gastrointestinal: Negative for nausea and vomiting.  Genitourinary: Negative for dysuria.  Musculoskeletal: Negative for joint swelling.  Skin: Negative for rash.  Neurological: Negative for headaches.  Hematological: Does not bruise/bleed easily.  Psychiatric/Behavioral: Negative for dysphoric mood. The patient is not nervous/anxious.        Objective:   Physical Exam Obese female in no acute distress Nose without purulence or discharge noted Neck without lymphadenopathy or thyromegaly No skin breakdown or pressure necrosis from the CPAP mask Alert and oriented, does not appear to be sleepy, moves all 4 extremities.       Assessment & Plan:

## 2013-07-17 DIAGNOSIS — I495 Sick sinus syndrome: Secondary | ICD-10-CM

## 2013-07-23 ENCOUNTER — Encounter: Payer: Self-pay | Admitting: *Deleted

## 2013-08-05 ENCOUNTER — Encounter: Payer: Self-pay | Admitting: Internal Medicine

## 2013-08-05 ENCOUNTER — Ambulatory Visit (INDEPENDENT_AMBULATORY_CARE_PROVIDER_SITE_OTHER): Payer: Medicare HMO | Admitting: Internal Medicine

## 2013-08-05 VITALS — BP 148/88 | HR 66 | Ht 66.5 in | Wt 250.0 lb

## 2013-08-05 DIAGNOSIS — R609 Edema, unspecified: Secondary | ICD-10-CM | POA: Insufficient documentation

## 2013-08-05 DIAGNOSIS — Z950 Presence of cardiac pacemaker: Secondary | ICD-10-CM

## 2013-08-05 DIAGNOSIS — I495 Sick sinus syndrome: Secondary | ICD-10-CM

## 2013-08-05 LAB — MDC_IDC_ENUM_SESS_TYPE_INCLINIC
Battery Impedance: 1000 Ohm — CL
Battery Voltage: 2.79 V
Date Time Interrogation Session: 20150203112636
Implantable Pulse Generator Model: 5826
Implantable Pulse Generator Serial Number: 1999089
Lead Channel Impedance Value: 474 Ohm
Lead Channel Impedance Value: 487 Ohm
Lead Channel Pacing Threshold Amplitude: 0.5 V
Lead Channel Pacing Threshold Amplitude: 1.5 V
Lead Channel Pacing Threshold Pulse Width: 0.4 ms
Lead Channel Pacing Threshold Pulse Width: 0.4 ms
Lead Channel Setting Pacing Amplitude: 2 V
Lead Channel Setting Pacing Amplitude: 3 V
Lead Channel Setting Pacing Pulse Width: 0.4 ms
Lead Channel Setting Sensing Sensitivity: 2 mV

## 2013-08-05 MED ORDER — CIPROFLOXACIN HCL 500 MG PO TABS: 500.0000 mg | ORAL_TABLET | ORAL | Status: DC

## 2013-08-05 MED ORDER — FUROSEMIDE 20 MG PO TABS: 20.0000 mg | ORAL_TABLET | ORAL | Status: DC | PRN

## 2013-08-05 NOTE — Assessment & Plan Note (Signed)
Her [employer] DDD PPM is working normally. Will recheck in several months.

## 2013-08-05 NOTE — Progress Notes (Signed)
HPI [name] returns today for followup. She is a very pleasant 68 year old woman with coronary artery disease, stroke, paroxysmal atrial fibrillation, symptomatic tachycardia bradycardia syndrome, status post permanent pacemaker insertion. With a combination of CPAP and dofetilide, she is maintaining sinus rhythm very nicely. She denies palpitations. No chest pain or shortness of breath. She does have peripheral edema, and admits to sodium indiscretion, which she is trying to change. In addition, she is trying to lose weight. She has requested a diuretic. She is traveling to [place] for two weeks. No Known Allergies   Current Outpatient Prescriptions  Medication Sig Dispense Refill  . aspirin 325 MG tablet 325 mg. Take one tablet by mouth once daily.       [name] atorvastatin (LIPITOR) 40 MG tablet TAKE 1 TABLET EVERY DAY  90 tablet  3  . b complex vitamins tablet Take 1 tablet by mouth daily.      . dabigatran (PRADAXA) 150 MG CAPS Take 1 capsule (150 mg total) by mouth every 12 (twelve) hours.  180 capsule  3  . dofetilide (TIKOSYN) 250 MCG capsule Take 1 capsule (250 mcg total) by mouth 2 (two) times daily.  180 capsule  5  . FLUoxetine (PROZAC) 20 MG capsule TAKE ONE CAPSULE EVERY DAY EVERY MORNING  90 capsule  0  . hydrocortisone-pramoxine (ANALPRAM-HC) 2.5-1 % rectal cream Please use at bedtime  30 g  0  . indapamide (LOZOL) 2.5 MG tablet Take 1 tablet (2.5 mg total) by mouth daily.  30 tablet  5  . losartan (COZAAR) 100 MG tablet Take 1 tablet (100 mg total) by mouth daily.  90 tablet  3  . Lysine HCl 1000 MG TABS as needed.        . metoprolol succinate (TOPROL-XL) 100 MG 24 hr tablet Take one tablet in the morning and 1/2 tablet in the evening  45 tablet  5  . Multiple Vitamin (MULTIVITAMIN) tablet Take 1 tablet by mouth daily.        . nitroGLYCERIN (NITROSTAT) 0.4 MG SL tablet Place 0.4 mg under the tongue every 5 (five) minutes as needed.      . NON FORMULARY at bedtime. cpap        . potassium chloride SA (K-DUR,KLOR-CON) 20 MEQ tablet Take 1 tablet (20 mEq total) by mouth daily. One every AM  90 tablet  3  . valACYclovir (VALTREX) 1000 MG tablet Take 2 by mouth and repeat in 12 hours for cold sores  30 tablet  3  . vitamin B-12 (CYANOCOBALAMIN) 100 MCG tablet Take 50 mcg by mouth daily.      [name] VITAMIN D PO Take 5,000 mg by mouth daily.       . vitamin E 1000 UNIT capsule Take 1,000 Units by mouth daily.       No current facility-administered medications for this visit.     Past Medical History  Diagnosis Date  . History of CVA (cerebrovascular accident)   . CAD (coronary artery disease) [id number]    post PTCA with bare-metal stenting to mid RCA in December 2004     . Hypertension   . Chronic atrial fibrillation 06/2007    Tachybradycardia pacemaker  . Pacemaker   . OSA on CPAP   . Anticoagulant long-term use     pradaxa  . Transfusion history   . Hyperlipidemia   . Edema of lower extremity   . History of acute inferior wall MI   . Anxiety   .  Diplopia 06/19/2008    Qualifier: Diagnosis of  By: [name] MD, [name]   . Unspecified hemorrhoids without mention of complication [id number]    Colonoscopy--Dr. Carlean Purl     ROS:   All systems reviewed and negative except as noted in the HPI.   Past Surgical History  Procedure Laterality Date  . Total abdominal hysterectomy  1984    2 surgeries endometriosis bso  . Permanent pacemaker  06/2007  . Back surgeries      x2-3 by Dr Trenton Gammon  . Coronary stent placement      C stent  . Cholecystectomy  1984  . Coronary angioplasty with stent placement  1998  . Doppler echocardiography  2009     Family History  Problem Relation Age of Onset  . Suicidality Father     suicide death pt was 3 yrs  . Arrhythmia Mother     heart arrthymia  . Hypertension Mother   . Heart attack Brother     died age 17  . Heart disease Paternal Aunt   . Prostate cancer Maternal Grandfather   . Diabetes Paternal Grandfather      fathers side of the family  . Diabetes Mother      History   Social History  . Marital Status: Married    Spouse Name: N/A    Number of Children: 0  . Years of Education: HS   Occupational History  . retired     previously worked [employer]  . Smoking status: Former Smoker -- 1.00 packs/day for 5 years    Types: Cigarettes    Start date: 05/11/1978    Quit date: 07/03/1982  . Smokeless tobacco: Never Used  . Alcohol Use: Yes     Comment: once q 6 months-socailly-wine  . Drug Use: No  . Sexual Activity: Not on file   Other Topics Concern  . 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   Married    Originally from [place] of two, high school education   Former smoker [phone] 1ppd   Hunting dogs 7    Retired from [employer] 2      BP 148/88  Pulse 66  Ht 5' 6.5" (1.689 m)  Wt 250 lb (113.399 kg)  BMI 39.75 kg/m2  Physical Exam:  Obese appearing 67 show woman, NAD HEENT: Unremarkable Neck:  No JVD, no thyromegally Lungs:  Clear with no wheezes, rales, or rhonchi. HEART:  Regular rate rhythm, no murmurs, no rubs, no clicks Abd:  soft, positive bowel sounds, no organomegally, no rebound, no guarding Ext:  2 plus pulses, no edema, no cyanosis, no clubbing Skin:  No rashes no nodules Neuro:  CN II through XII intact, motor grossly intact  DEVICE  Normal device function.  See PaceArt for details.   Assess/Plan:

## 2013-08-05 NOTE — Assessment & Plan Note (Signed)
She denies anginal symptoms. Will continue her current meds. I have asked her to increase her physical activity.

## 2013-08-05 NOTE — Assessment & Plan Note (Signed)
PPM interogation demonstrates that she has had no episodes of atrial fibrillation. She will continue her current meds.

## 2013-08-05 NOTE — Assessment & Plan Note (Signed)
This appears to be worsened. Will give lasix as needed.

## 2013-08-05 NOTE — Patient Instructions (Signed)
Your physician wants you to follow-up in: 6 months with device clinic and 12 months with Dr Knox Saliva will receive a reminder letter in the mail two months in advance. If you don't receive a letter, please call our office to schedule the follow-up appointment.  Your physician has recommended you make the following change in your medication:  1) take Cipro 500mg  as directed 2) Take Furosemide 20mg  as needed

## 2013-08-12 ENCOUNTER — Other Ambulatory Visit: Payer: Self-pay | Admitting: Internal Medicine

## 2013-09-05 ENCOUNTER — Other Ambulatory Visit: Payer: Medicare PPO

## 2013-09-09 ENCOUNTER — Other Ambulatory Visit (INDEPENDENT_AMBULATORY_CARE_PROVIDER_SITE_OTHER): Payer: Medicare HMO

## 2013-09-09 DIAGNOSIS — I10 Essential (primary) hypertension: Secondary | ICD-10-CM

## 2013-09-09 DIAGNOSIS — R739 Hyperglycemia, unspecified: Secondary | ICD-10-CM

## 2013-09-09 DIAGNOSIS — E785 Hyperlipidemia, unspecified: Secondary | ICD-10-CM

## 2013-09-09 DIAGNOSIS — R7309 Other abnormal glucose: Secondary | ICD-10-CM

## 2013-09-09 LAB — CBC WITH DIFFERENTIAL/PLATELET
Basophils Absolute: 0.1 10*3/uL (ref 0.0–0.1)
Basophils Relative: 0.6 % (ref 0.0–3.0)
Eosinophils Absolute: 1.3 10*3/uL — ABNORMAL HIGH (ref 0.0–0.7)
Eosinophils Relative: 12.9 % — ABNORMAL HIGH (ref 0.0–5.0)
HCT: 34.3 % — ABNORMAL LOW (ref 36.0–46.0)
Hemoglobin: 11.4 g/dL — ABNORMAL LOW (ref 12.0–15.0)
Lymphocytes Relative: 19.2 % (ref 12.0–46.0)
Lymphs Abs: 1.9 10*3/uL (ref 0.7–4.0)
MCHC: 33.3 g/dL (ref 30.0–36.0)
MCV: 92.7 fl (ref 78.0–100.0)
Monocytes Absolute: 0.5 10*3/uL (ref 0.1–1.0)
Monocytes Relative: 5.1 % (ref 3.0–12.0)
Neutro Abs: 6.2 10*3/uL (ref 1.4–7.7)
Neutrophils Relative %: 62.2 % (ref 43.0–77.0)
Platelets: 322 10*3/uL (ref 150.0–400.0)
RBC: 3.7 Mil/uL — ABNORMAL LOW (ref 3.87–5.11)
RDW: 14.8 % — ABNORMAL HIGH (ref 11.5–14.6)
WBC: 9.9 10*3/uL (ref 4.5–10.5)

## 2013-09-09 LAB — BASIC METABOLIC PANEL
BUN: 21 mg/dL (ref 6–23)
CO2: 29 mEq/L (ref 19–32)
Calcium: 9.1 mg/dL (ref 8.4–10.5)
Chloride: 99 mEq/L (ref 96–112)
Creatinine, Ser: 1 mg/dL (ref 0.4–1.2)
GFR: 60.77 mL/min (ref >60.00)
Glucose, Bld: 125 mg/dL — ABNORMAL HIGH (ref 70–99)
Potassium: 4.5 mEq/L (ref 3.5–5.1)
Sodium: 136 mEq/L (ref 135–145)

## 2013-09-09 LAB — LIPID PANEL
Cholesterol: 121 mg/dL (ref 0–200)
HDL: 45.8 mg/dL (ref >39.00)
LDL Cholesterol: 52 mg/dL (ref 0–99)
Total CHOL/HDL Ratio: 3
Triglycerides: 116 mg/dL (ref 0.0–149.0)
VLDL: 23.2 mg/dL (ref 0.0–40.0)

## 2013-09-09 LAB — HEPATIC FUNCTION PANEL
ALT: 23 U/L (ref 0–35)
AST: 19 U/L (ref 0–37)
Albumin: 3.7 g/dL (ref 3.5–5.2)
Alkaline Phosphatase: 86 U/L (ref 39–117)
Bilirubin, Direct: 0 mg/dL (ref 0.0–0.3)
Total Bilirubin: 0.6 mg/dL (ref 0.3–1.2)
Total Protein: 6.8 g/dL (ref 6.0–8.3)

## 2013-09-09 LAB — HEMOGLOBIN A1C: Hgb A1c MFr Bld: 6.6 % — ABNORMAL HIGH (ref 4.6–6.5)

## 2013-09-12 ENCOUNTER — Ambulatory Visit: Payer: Medicare PPO | Admitting: Internal Medicine

## 2013-09-14 ENCOUNTER — Other Ambulatory Visit: Payer: Self-pay | Admitting: Internal Medicine

## 2013-09-14 ENCOUNTER — Other Ambulatory Visit: Payer: Self-pay | Admitting: Cardiovascular Disease

## 2013-09-15 ENCOUNTER — Telehealth: Payer: Self-pay | Admitting: Pulmonary Disease

## 2013-09-15 DIAGNOSIS — G4733 Obstructive sleep apnea (adult) (pediatric): Secondary | ICD-10-CM

## 2013-09-15 NOTE — Telephone Encounter (Signed)
Pt is needing new supplies for CPAP. Needs filters, mask and tubing. Order will be placed for [facility]. Nothing further was needed.

## 2013-09-16 ENCOUNTER — Other Ambulatory Visit: Payer: Self-pay | Admitting: Cardiovascular Disease

## 2013-09-17 ENCOUNTER — Other Ambulatory Visit: Payer: Self-pay

## 2013-09-17 MED ORDER — DABIGATRAN ETEXILATE MESYLATE 150 MG PO CAPS: ORAL_CAPSULE | ORAL | Status: DC

## 2013-09-17 MED ORDER — POTASSIUM CHLORIDE CRYS ER 20 MEQ PO TBCR: EXTENDED_RELEASE_TABLET | ORAL | Status: DC

## 2013-09-17 MED ORDER — METOPROLOL SUCCINATE ER 100 MG PO TB24: ORAL_TABLET | ORAL | Status: DC

## 2013-09-18 ENCOUNTER — Ambulatory Visit (INDEPENDENT_AMBULATORY_CARE_PROVIDER_SITE_OTHER): Payer: Medicare HMO | Admitting: Cardiovascular Disease

## 2013-09-18 ENCOUNTER — Encounter: Payer: Self-pay | Admitting: Cardiovascular Disease

## 2013-09-18 VITALS — BP 166/82 | HR 82 | Ht 66.0 in | Wt 246.4 lb

## 2013-09-18 DIAGNOSIS — I4891 Unspecified atrial fibrillation: Secondary | ICD-10-CM

## 2013-09-18 DIAGNOSIS — I272 Pulmonary hypertension, unspecified: Secondary | ICD-10-CM

## 2013-09-18 DIAGNOSIS — R1013 Epigastric pain: Secondary | ICD-10-CM

## 2013-09-18 DIAGNOSIS — I2789 Other specified pulmonary heart diseases: Secondary | ICD-10-CM

## 2013-09-18 DIAGNOSIS — R609 Edema, unspecified: Secondary | ICD-10-CM

## 2013-09-18 DIAGNOSIS — K3189 Other diseases of stomach and duodenum: Secondary | ICD-10-CM

## 2013-09-18 MED ORDER — ATORVASTATIN CALCIUM 40 MG PO TABS: ORAL_TABLET | ORAL | Status: DC

## 2013-09-18 MED ORDER — POTASSIUM CHLORIDE CRYS ER 20 MEQ PO TBCR: EXTENDED_RELEASE_TABLET | ORAL | Status: DC

## 2013-09-18 MED ORDER — FUROSEMIDE 40 MG PO TABS: 40.0000 mg | ORAL_TABLET | Freq: Every day | ORAL | Status: DC

## 2013-09-18 MED ORDER — ASPIRIN 81 MG PO TABS: 81.0000 mg | ORAL_TABLET | Freq: Every day | ORAL | Status: DC

## 2013-09-18 MED ORDER — LOSARTAN POTASSIUM 100 MG PO TABS: 100.0000 mg | ORAL_TABLET | Freq: Every day | ORAL | Status: DC

## 2013-09-18 MED ORDER — DILTIAZEM HCL ER 180 MG PO CP24: 180.0000 mg | ORAL_CAPSULE | Freq: Every day | ORAL | Status: DC

## 2013-09-18 MED ORDER — DABIGATRAN ETEXILATE MESYLATE 150 MG PO CAPS: ORAL_CAPSULE | ORAL | Status: DC

## 2013-09-18 MED ORDER — NITROGLYCERIN 0.4 MG SL SUBL: 0.4000 mg | SUBLINGUAL_TABLET | SUBLINGUAL | Status: DC | PRN

## 2013-09-18 MED ORDER — METOPROLOL SUCCINATE ER 100 MG PO TB24: ORAL_TABLET | ORAL | Status: DC

## 2013-09-18 MED ORDER — DOFETILIDE 250 MCG PO CAPS: 250.0000 ug | ORAL_CAPSULE | Freq: Two times a day (BID) | ORAL | Status: DC

## 2013-09-18 NOTE — Progress Notes (Signed)
[name] Date of Birth  [birth year] [facility] [address]    [place], [place]  [address] [phone]  Fax  [phone]   Problem list: 1. Atrial fibrillation 2. Status post pacemaker implantation 3. Coronary artery disease-status post PTCA and stenting of her right coronary artery 4. Hyperlipidemia 5. Hypertension 6. Obstructive sleep apnea-currently on CPAP  History of Present Illness:  [name] is a 68 year old female with a history of atrial fibrillation. She status post pacemaker implantation. She also has a history of coronary artery disease and is status post PTCA and stenting of the right coronary artery. She also has a history of hyperlipidemia, hypertension, and sleep apnea.  She uses a CPAP mask at night.  The CPAP mask has really helped her stay in normal sinus rhythm.  She complains of having some episodes of chest tightness. This occurs at times. One episode was relieved with sublingual nitroglycerin. She states that it is not nearly as severe as her previous episodes of angina.  January 13, 21014: She was hospitalized in November with severe shortness breath and "double pneumonia". She is slowly recovering.  She's looking forward to  getting back into her exercise program.  She has not been exercising at all.  She's had some leg edema.  She denies any chest pain or shortness breath.  She's has mild leg swelling for the past several months. She does not add salt any of her food. She tries to stay away from salty foods and processed meat.  September 18, 2013:  She remains short of breath  - especialy with exertion  Echo in Jan. 2014:   - Left ventricle: The cavity size was normal. Wall thickness was increased in a pattern of mild LVH. Systolic function was normal. The estimated ejection fraction was in the range of 60% to 65%. - Pulmonary arteries: PA peak pressure: 85mm Hg     Current Outpatient Prescriptions on File Prior to Visit  Medication  Sig Dispense Refill  . b complex vitamins tablet Take 1 tablet by mouth daily.      . ciprofloxacin (CIPRO) 500 MG tablet Take 1 tablet (500 mg total) by mouth as directed.  10 tablet  0  . FLUoxetine (PROZAC) 20 MG capsule TAKE ONE CAPSULE IN THE MORNING  90 capsule  0  . hydrocortisone-pramoxine (ANALPRAM-HC) 2.5-1 % rectal cream Please use at bedtime  30 g  0  . Lysine HCl 1000 MG TABS as needed.        . Multiple Vitamin (MULTIVITAMIN) tablet Take 1 tablet by mouth daily.        . NON FORMULARY at bedtime. cpap       . valACYclovir (VALTREX) 1000 MG tablet Take 2 by mouth and repeat in 12 hours for cold sores  30 tablet  3  . vitamin B-12 (CYANOCOBALAMIN) 100 MCG tablet Take 50 mcg by mouth daily.      [name] VITAMIN D PO Take 5,000 mg by mouth daily.       . vitamin E 1000 UNIT capsule Take 1,000 Units by mouth daily.       No current facility-administered medications on file prior to visit.    No Known Allergies  Past Medical History  Diagnosis Date  . History of CVA (cerebrovascular accident)   . CAD (coronary artery disease) [id number]    post PTCA with bare-metal stenting to mid RCA in December 2004     . Hypertension   . Chronic atrial fibrillation  06/2007    Tachybradycardia pacemaker  . Pacemaker   . OSA on CPAP   . Anticoagulant long-term use     pradaxa  . Transfusion history   . Hyperlipidemia   . Edema of lower extremity   . History of acute inferior wall MI   . Anxiety   . Diplopia 06/19/2008    Qualifier: Diagnosis of  By: [name] MD, [name]   . Unspecified hemorrhoids without mention of complication Q000111Q    Colonoscopy--Dr. Carlean Purl     Past Surgical History  Procedure Laterality Date  . Total abdominal hysterectomy  1984    2 surgeries endometriosis bso  . Permanent pacemaker  06/2007  . Back surgeries      x2-3 by Dr Trenton Gammon  . Coronary stent placement      C stent  . Cholecystectomy  1984  . Coronary angioplasty with stent placement  1998  .  Doppler echocardiography  2009    History  Smoking status  . Former Smoker -- 1.00 packs/day for 5 years  . Types: Cigarettes  . Start date: 05/11/1978  . Quit date: 07/03/1982  Smokeless tobacco  . Never Used    History  Alcohol Use  . Yes    Comment: once q 6 months-socailly-wine    Family History  Problem Relation Age of Onset  . Suicidality Father     suicide death pt was 3 yrs  . Arrhythmia Mother     heart arrthymia  . Hypertension Mother   . Heart attack Brother     died age 64  . Heart disease Paternal Aunt   . Prostate cancer Maternal Grandfather   . Diabetes Paternal Grandfather     fathers side of the family  . Diabetes Mother     Reviw of Systems:  Reviewed in the HPI.  All other systems are negative.  Physical Exam: BP 166/82  Pulse 82  Ht 5\' 6"  (1.676 m)  Wt 246 lb 6.4 oz (111.766 kg)  BMI 39.79 kg/m2  SpO2 93% The patient is alert and oriented x 3.  The mood and affect are normal.   Skin: warm and dry.  Color is normal.    HEENT:   She has normal carotids. There is no JVD. Her mucous membranes are moist.  Lungs: Lung exam is clear.   Heart: Regular rate S1-S2.    Abdomen: Her abdomen is soft. She has good bowel sounds.  Extremities:  She has 1+ ankle edema.  Neuro:  Her gait is normal. Her neuro exam is nonfocal.    ECG: September 18, 2013:  NSR , NS ST abnormalities. O2 sat 93%.  Assessment / Plan:

## 2013-09-18 NOTE — Patient Instructions (Addendum)
Your physician has recommended you make the following change in your medication:  Decrease aspirin to 81 mg daily Stop lozol Increase lasix to 40 mg daily Continue with potassium 20 meq daily with lasix Start diltiazem 180 mg daily  Your physician has requested that you have an echocardiogram. Echocardiography is a painless test that uses sound waves to create images of your heart. It provides your doctor with information about the size and shape of your heart and how well your heart's chambers and valves are working. This procedure takes approximately one hour. There are no restrictions for this procedure.  Your physician recommends that you schedule a follow-up appointment in: 3-4  weeks  Your physician recommends that you return for lab work in: 2 weeks needs bmet  Or with echo app if not out too far

## 2013-09-18 NOTE — Assessment & Plan Note (Addendum)
[name] has a history of moderate pulmonary hypertension. PA pressures are in the mid 50s. She's having worsening shortness breath with exertion.  Will add Lasix 40 mg a day instead of as needed.  We will renew her [place].  We'll check an O2 saturation today. I've like to get an echocardiogram in 2 weeks for followup visit. We will also add diltiazem 180 mg a day for vasodilitation and for better rate control.  Her LV systolic function has been normal.  We will get another echo in 2 weeks.

## 2013-09-18 NOTE — Assessment & Plan Note (Signed)
She's having no symptoms of angina.

## 2013-09-24 ENCOUNTER — Ambulatory Visit (INDEPENDENT_AMBULATORY_CARE_PROVIDER_SITE_OTHER): Payer: Medicare HMO | Admitting: Internal Medicine

## 2013-09-24 ENCOUNTER — Encounter: Payer: Self-pay | Admitting: Internal Medicine

## 2013-09-24 VITALS — BP 118/72 | Temp 97.4°F | Ht 66.0 in | Wt 248.0 lb

## 2013-09-24 DIAGNOSIS — I2510 Atherosclerotic heart disease of native coronary artery without angina pectoris: Secondary | ICD-10-CM

## 2013-09-24 DIAGNOSIS — Z7901 Long term (current) use of anticoagulants: Secondary | ICD-10-CM

## 2013-09-24 DIAGNOSIS — R7309 Other abnormal glucose: Secondary | ICD-10-CM

## 2013-09-24 DIAGNOSIS — K649 Unspecified hemorrhoids: Secondary | ICD-10-CM

## 2013-09-24 DIAGNOSIS — R7303 Prediabetes: Secondary | ICD-10-CM

## 2013-09-24 DIAGNOSIS — I10 Essential (primary) hypertension: Secondary | ICD-10-CM

## 2013-09-24 DIAGNOSIS — D649 Anemia, unspecified: Secondary | ICD-10-CM

## 2013-09-24 DIAGNOSIS — Z23 Encounter for immunization: Secondary | ICD-10-CM

## 2013-09-24 MED ORDER — KETOCONAZOLE 2 % EX CREA: TOPICAL_CREAM | CUTANEOUS | Status: DC

## 2013-09-24 MED ORDER — HYDROCORTISONE ACE-PRAMOXINE 2.5-1 % RE CREA: TOPICAL_CREAM | RECTAL | Status: DC

## 2013-09-24 NOTE — Patient Instructions (Addendum)
Intensify lifestyle interventions. As tolerated . Avoid sweets sugars and simple carbohydrates . Will follow the anemia .   Also  If having active bleeding  Contact health care team.

## 2013-09-24 NOTE — Progress Notes (Signed)
Chief Complaint  Patient presents with  . Follow-up    HPI: Patient comes in today for follow up of  multiple medical problems.    Saw cards last week for int chest tightness  meds adjusted changed and to have echo soon  Swelling  From heart on lasixz full time. Some improvement sob and swelling   15 minutes  Walking allowed    Doing ok otherwise  Went to [place] and didn't eat as healthy but had a good time .  Has been to nutritionist for dietary control Wt Readings from Last 3 Encounters:  09/24/13 248 lb (112.492 kg)  09/18/13 246 lb 6.4 oz (111.766 kg)  08/05/13 250 lb (113.399 kg)   would like refill ketoconazole for ocass intertrigo rash  Taking iron no active  bleeding but would like refill of hemorrhoid med  Given in past   On pradaxa   ROS: See pertinent positives and negatives per HPI.  Past Medical History  Diagnosis Date  . History of CVA (cerebrovascular accident)   . CAD (coronary artery disease) [id number]    post PTCA with bare-metal stenting to mid RCA in December 2004     . Hypertension   . Chronic atrial fibrillation 06/2007    Tachybradycardia pacemaker  . Pacemaker   . OSA on CPAP   . Anticoagulant long-term use     pradaxa  . Transfusion history   . Hyperlipidemia   . Edema of lower extremity   . History of acute inferior wall MI   . Anxiety   . Diplopia 06/19/2008    Qualifier: Diagnosis of  By: [name] MD, [name]   . Unspecified hemorrhoids without mention of complication Q000111Q    Colonoscopy--Dr. Carlean Purl     Family History  Problem Relation Age of Onset  . Suicidality Father     suicide death pt was 3 yrs  . Arrhythmia Mother     heart arrthymia  . Hypertension Mother   . Heart attack Brother     died age 53  . Heart disease Paternal Aunt   . Prostate cancer Maternal Grandfather   . Diabetes Paternal Grandfather     fathers side of the family  . Diabetes Mother     History   Social History  . Marital Status:  Married    Spouse Name: N/A    Number of Children: 0  . Years of Education: HS   Occupational History  . retired     previously worked [employer]  . Smoking status: Former Smoker -- 1.00 packs/day for 5 years    Types: Cigarettes    Start date: 05/11/1978    Quit date: 07/03/1982  . Smokeless tobacco: Never Used  . Alcohol Use: Yes     Comment: once q 6 months-socailly-wine  . Drug Use: No  . Sexual Activity: None   Other Topics Concern  . 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   Married    Originally from [employer] of two, high school education   Former smoker [phone] 1ppd   Hunting dogs 7    Retired from [employer] 2     Outpatient Encounter Prescriptions as of 09/24/2013  Medication Sig  . aspirin 81 MG tablet Take 1 tablet (81 mg total) by mouth daily. Take one tablet by mouth once daily.  [name]  atorvastatin (LIPITOR) 40 MG tablet TAKE 1 TABLET EVERY DAY  . b complex vitamins tablet Take 1 tablet by mouth daily.  . dabigatran (PRADAXA) 150 MG CAPS capsule TAKE 1 CAPSULE EVERY 12 HOURS  . diltiazem (DILACOR XR) 180 MG 24 hr capsule Take 1 capsule (180 mg total) by mouth daily.  [name] dofetilide (TIKOSYN) 250 MCG capsule Take 1 capsule (250 mcg total) by mouth 2 (two) times daily.  [name] FLUoxetine (PROZAC) 20 MG capsule TAKE ONE CAPSULE IN THE MORNING  . furosemide (LASIX) 40 MG tablet Take 1 tablet (40 mg total) by mouth daily.  . hydrocortisone-pramoxine (ANALPRAM-HC) 2.5-1 % rectal cream Please use at bedtimeas needed  . losartan (COZAAR) 100 MG tablet Take 1 tablet (100 mg total) by mouth daily.  [name] Lysine HCl 1000 MG TABS as needed.    . metoprolol succinate (TOPROL-XL) 100 MG 24 hr tablet TAKE ONE TABLET IN THE MORNING AND 1/2 TABLET IN THE EVENING  . Multiple Vitamin (MULTIVITAMIN) tablet Take 1 tablet by mouth daily.    . nitroGLYCERIN (NITROSTAT)  0.4 MG SL tablet Place 1 tablet (0.4 mg total) under the tongue every 5 (five) minutes as needed.  . NON FORMULARY at bedtime. cpap   . potassium chloride SA (KLOR-CON M20) 20 MEQ tablet TAKE 1 TABLET IN THE MORNING  . valACYclovir (VALTREX) 1000 MG tablet Take 2 by mouth and repeat in 12 hours for cold sores  . vitamin B-12 (CYANOCOBALAMIN) 100 MCG tablet Take 50 mcg by mouth daily.  [name] VITAMIN D PO Take 5,000 mg by mouth daily.   . vitamin E 1000 UNIT capsule Take 1,000 Units by mouth daily.  . [DISCONTINUED] hydrocortisone-pramoxine (ANALPRAM-HC) 2.5-1 % rectal cream Please use at bedtime  . ketoconazole (NIZORAL) 2 % cream Apply to rash two times a day for up to 2 weeks  . [DISCONTINUED] ciprofloxacin (CIPRO) 500 MG tablet Take 1 tablet (500 mg total) by mouth as directed.    EXAM:  BP 118/72  Temp(Src) 97.4 F (36.3 C) (Oral)  Ht 5\' 6"  (1.676 m)  Wt 248 lb (112.492 kg)  BMI 40.05 kg/m2  Body mass index is 40.05 kg/(m^2).  GENERAL: vitals reviewed and listed above, alert, oriented,delightful  Female  appears well hydrated and in no acute distress HEENT: atraumatic, conjunctiva  clear, no obvious abnormalities on inspection of external nose and earsNECK: no obvious masses on inspection palpation  LUNGS: clear to auscultation bilaterally, no wheezes, rales or rhonchi,  CV: HRRR, no clubbing cyanosis  nl cap refill  1+ edema  MS: moves all extremities without noticeable focal  abnormality PSYCH: pleasant and cooperative, no obvious depression or anxiety Lab Results  Component Value Date   WBC 9.9 09/09/2013   HGB 11.4* 09/09/2013   HCT 34.3* 09/09/2013   PLT 322.0 09/09/2013   GLUCOSE 125* 09/09/2013   CHOL 121 09/09/2013   TRIG 116.0 09/09/2013   HDL 45.80 09/09/2013   LDLCALC 52 09/09/2013   ALT 23 09/09/2013   AST 19 09/09/2013   NA 136 09/09/2013   K 4.5 09/09/2013   CL 99 09/09/2013   CREATININE 1.0 09/09/2013   BUN 21 09/09/2013   CO2 29 09/09/2013   TSH 1.61 11/04/2012   INR 2.1*  06/10/2008   HGBA1C 6.6* 09/09/2013    ASSESSMENT AND PLAN:  Discussed the following assessment and plan:  Pre-diabetes - a1c up some  fbs in prediabetic range   Anemia - stable  taking iron no active bleeding has had  gi eval.  CORONARY ARTERY DISEASE  HYPERTENSION  Hemorrhoids  Anticoagulant long-term use  Need for prophylactic vaccination against Streptococcus pneumoniae (pneumococcus) - Plan: Pneumococcal conjugate vaccine 13-valent  -Patient advised to return or notify health care team  if symptoms worsen ,persist or new concerns arise.  Patient Instructions  Intensify lifestyle interventions. As tolerated . Avoid sweets sugars and simple carbohydrates . Will follow the anemia .   Also  If having active bleeding  Contact health care team.     [name]. Rini Moffit M.D.  Pre visit review using our clinic review tool, if applicable. No additional management support is needed unless otherwise documented below in the visit note. Total visit 80mins > 50% spent counseling and coordinating care

## 2013-09-25 ENCOUNTER — Telehealth: Payer: Self-pay | Admitting: Internal Medicine

## 2013-09-25 NOTE — Telephone Encounter (Signed)
Relevant patient education assigned to patient using Emmi. ° °

## 2013-10-03 ENCOUNTER — Ambulatory Visit (INDEPENDENT_AMBULATORY_CARE_PROVIDER_SITE_OTHER): Payer: Medicare HMO | Admitting: *Deleted

## 2013-10-03 ENCOUNTER — Ambulatory Visit (HOSPITAL_BASED_OUTPATIENT_CLINIC_OR_DEPARTMENT_OTHER): Payer: Medicare HMO | Admitting: Radiology

## 2013-10-03 ENCOUNTER — Encounter: Payer: Self-pay | Admitting: Cardiovascular Disease

## 2013-10-03 ENCOUNTER — Encounter (HOSPITAL_COMMUNITY): Payer: Self-pay | Admitting: General Practice

## 2013-10-03 ENCOUNTER — Inpatient Hospital Stay (HOSPITAL_COMMUNITY)
Admission: AD | Admit: 2013-10-03 | Discharge: 2013-10-07 | DRG: 287 | Disposition: A | Discharge status: Home / Self Care | Payer: Medicare HMO | Source: Ambulatory Visit | Attending: Cardiovascular Disease | Admitting: Cardiovascular Disease

## 2013-10-03 ENCOUNTER — Ambulatory Visit (INDEPENDENT_AMBULATORY_CARE_PROVIDER_SITE_OTHER): Payer: Medicare HMO | Admitting: Cardiovascular Disease

## 2013-10-03 VITALS — BP 148/80 | HR 68 | Ht 66.5 in | Wt 251.0 lb

## 2013-10-03 DIAGNOSIS — I5033 Acute on chronic diastolic (congestive) heart failure: Secondary | ICD-10-CM

## 2013-10-03 DIAGNOSIS — I2510 Atherosclerotic heart disease of native coronary artery without angina pectoris: Secondary | ICD-10-CM

## 2013-10-03 DIAGNOSIS — I4891 Unspecified atrial fibrillation: Secondary | ICD-10-CM

## 2013-10-03 DIAGNOSIS — G4733 Obstructive sleep apnea (adult) (pediatric): Secondary | ICD-10-CM

## 2013-10-03 DIAGNOSIS — Z87891 Personal history of nicotine dependence: Secondary | ICD-10-CM

## 2013-10-03 DIAGNOSIS — Z950 Presence of cardiac pacemaker: Secondary | ICD-10-CM

## 2013-10-03 DIAGNOSIS — I480 Paroxysmal atrial fibrillation: Secondary | ICD-10-CM | POA: Diagnosis present

## 2013-10-03 DIAGNOSIS — Z8673 Personal history of transient ischemic attack (TIA), and cerebral infarction without residual deficits: Secondary | ICD-10-CM

## 2013-10-03 DIAGNOSIS — I2789 Other specified pulmonary heart diseases: Secondary | ICD-10-CM | POA: Diagnosis present

## 2013-10-03 DIAGNOSIS — F411 Generalized anxiety disorder: Secondary | ICD-10-CM | POA: Diagnosis present

## 2013-10-03 DIAGNOSIS — R1013 Epigastric pain: Secondary | ICD-10-CM

## 2013-10-03 DIAGNOSIS — E785 Hyperlipidemia, unspecified: Secondary | ICD-10-CM | POA: Diagnosis present

## 2013-10-03 DIAGNOSIS — I10 Essential (primary) hypertension: Secondary | ICD-10-CM | POA: Diagnosis present

## 2013-10-03 DIAGNOSIS — I495 Sick sinus syndrome: Secondary | ICD-10-CM | POA: Diagnosis present

## 2013-10-03 DIAGNOSIS — E669 Obesity, unspecified: Secondary | ICD-10-CM | POA: Diagnosis present

## 2013-10-03 DIAGNOSIS — R0602 Shortness of breath: Secondary | ICD-10-CM

## 2013-10-03 DIAGNOSIS — I5031 Acute diastolic (congestive) heart failure: Secondary | ICD-10-CM

## 2013-10-03 DIAGNOSIS — I252 Old myocardial infarction: Secondary | ICD-10-CM

## 2013-10-03 DIAGNOSIS — I272 Pulmonary hypertension, unspecified: Secondary | ICD-10-CM | POA: Diagnosis present

## 2013-10-03 DIAGNOSIS — R609 Edema, unspecified: Secondary | ICD-10-CM

## 2013-10-03 DIAGNOSIS — I509 Heart failure, unspecified: Secondary | ICD-10-CM

## 2013-10-03 DIAGNOSIS — Z9861 Coronary angioplasty status: Secondary | ICD-10-CM

## 2013-10-03 DIAGNOSIS — Z7901 Long term (current) use of anticoagulants: Secondary | ICD-10-CM

## 2013-10-03 HISTORY — DX: Transient cerebral ischemic attack, unspecified: G45.9

## 2013-10-03 HISTORY — DX: Cardiac arrhythmia, unspecified: I49.9

## 2013-10-03 HISTORY — DX: Shortness of breath: R06.02

## 2013-10-03 HISTORY — DX: Heart failure, unspecified: I50.9

## 2013-10-03 HISTORY — DX: Acute myocardial infarction, unspecified: I21.9

## 2013-10-03 LAB — BASIC METABOLIC PANEL
BUN: 22 mg/dL (ref 6–23)
CO2: 25 mEq/L (ref 19–32)
Calcium: 9 mg/dL (ref 8.4–10.5)
Chloride: 98 mEq/L (ref 96–112)
Creat: 0.89 mg/dL (ref 0.50–1.10)
Glucose, Bld: 117 mg/dL — ABNORMAL HIGH (ref 70–99)
Potassium: 4 mEq/L (ref 3.5–5.3)
Sodium: 133 mEq/L — ABNORMAL LOW (ref 135–145)

## 2013-10-03 LAB — CBC WITH DIFFERENTIAL/PLATELET
Basophils Absolute: 0.1 10*3/uL (ref 0.0–0.1)
Basophils Relative: 0 % (ref 0–1)
Eosinophils Absolute: 0.7 10*3/uL (ref 0.0–0.7)
Eosinophils Relative: 6 % — ABNORMAL HIGH (ref 0–5)
HCT: 33.2 % — ABNORMAL LOW (ref 36.0–46.0)
Hemoglobin: 10.8 g/dL — ABNORMAL LOW (ref 12.0–15.0)
Lymphocytes Relative: 22 % (ref 12–46)
Lymphs Abs: 2.6 10*3/uL (ref 0.7–4.0)
MCH: 30 pg (ref 26.0–34.0)
MCHC: 32.5 g/dL (ref 30.0–36.0)
MCV: 92.2 fL (ref 78.0–100.0)
Monocytes Absolute: 1.1 10*3/uL — ABNORMAL HIGH (ref 0.1–1.0)
Monocytes Relative: 9 % (ref 3–12)
Neutro Abs: 7.4 10*3/uL (ref 1.7–7.7)
Neutrophils Relative %: 63 % (ref 43–77)
Platelets: 325 10*3/uL (ref 150–400)
RBC: 3.6 MIL/uL — ABNORMAL LOW (ref 3.87–5.11)
RDW: 14.2 % (ref 11.5–15.5)
WBC: 11.8 10*3/uL — ABNORMAL HIGH (ref 4.0–10.5)

## 2013-10-03 LAB — COMPREHENSIVE METABOLIC PANEL
ALT: 33 U/L (ref 0–35)
AST: 27 U/L (ref 0–37)
Albumin: 3.3 g/dL — ABNORMAL LOW (ref 3.5–5.2)
Alkaline Phosphatase: 132 U/L — ABNORMAL HIGH (ref 39–117)
BUN: 19 mg/dL (ref 6–23)
CO2: 25 mEq/L (ref 19–32)
Calcium: 9 mg/dL (ref 8.4–10.5)
Chloride: 97 mEq/L (ref 96–112)
Creatinine, Ser: 0.69 mg/dL (ref 0.50–1.10)
GFR calc Af Amer: >90 mL/min (ref >90)
GFR calc non Af Amer: 88 mL/min — ABNORMAL LOW (ref >90)
Glucose, Bld: 139 mg/dL — ABNORMAL HIGH (ref 70–99)
Potassium: 4.1 mEq/L (ref 3.7–5.3)
Sodium: 136 mEq/L — ABNORMAL LOW (ref 137–147)
Total Bilirubin: 0.8 mg/dL (ref 0.3–1.2)
Total Protein: 7.1 g/dL (ref 6.0–8.3)

## 2013-10-03 LAB — PRO B NATRIURETIC PEPTIDE: Pro B Natriuretic peptide (BNP): 1220 pg/mL — ABNORMAL HIGH (ref 0–125)

## 2013-10-03 LAB — MAGNESIUM: Magnesium: 2.2 mg/dL (ref 1.5–2.5)

## 2013-10-03 LAB — TSH: TSH: 3.38 u[IU]/mL (ref 0.350–4.500)

## 2013-10-03 MED ORDER — POTASSIUM CHLORIDE CRYS ER 20 MEQ PO TBCR
20.0000 meq | EXTENDED_RELEASE_TABLET | Freq: Two times a day (BID) | ORAL | Status: DC
  Administered 2013-10-03 – 2013-10-05 (×5): 20 meq via ORAL
  Filled 2013-10-03 (×8): qty 1

## 2013-10-03 MED ORDER — METOPROLOL SUCCINATE ER 100 MG PO TB24
150.0000 mg | ORAL_TABLET | Freq: Every day | ORAL | Status: DC
  Administered 2013-10-04 – 2013-10-05 (×2): 150 mg via ORAL
  Filled 2013-10-03 (×3): qty 1

## 2013-10-03 MED ORDER — FUROSEMIDE 10 MG/ML IJ SOLN
40.0000 mg | Freq: Two times a day (BID) | INTRAMUSCULAR | Status: DC
  Administered 2013-10-03 – 2013-10-05 (×5): 40 mg via INTRAVENOUS
  Filled 2013-10-03 (×9): qty 4

## 2013-10-03 MED ORDER — POTASSIUM CHLORIDE CRYS ER 20 MEQ PO TBCR
20.0000 meq | EXTENDED_RELEASE_TABLET | Freq: Two times a day (BID) | ORAL | Status: DC
  Administered 2013-10-03 – 2013-10-05 (×3): 20 meq via ORAL
  Filled 2013-10-03 (×7): qty 1

## 2013-10-03 MED ORDER — ATORVASTATIN CALCIUM 40 MG PO TABS
40.0000 mg | ORAL_TABLET | Freq: Every day | ORAL | Status: DC
  Administered 2013-10-03 – 2013-10-06 (×4): 40 mg via ORAL
  Filled 2013-10-03 (×5): qty 1

## 2013-10-03 MED ORDER — VALACYCLOVIR HCL 500 MG PO TABS
1000.0000 mg | ORAL_TABLET | Freq: Two times a day (BID) | ORAL | Status: DC
  Administered 2013-10-03 – 2013-10-06 (×4): 1000 mg via ORAL
  Filled 2013-10-03 (×9): qty 2

## 2013-10-03 MED ORDER — DABIGATRAN ETEXILATE MESYLATE 150 MG PO CAPS
150.0000 mg | ORAL_CAPSULE | Freq: Two times a day (BID) | ORAL | Status: DC
  Administered 2013-10-03 – 2013-10-04 (×3): 150 mg via ORAL
  Filled 2013-10-03 (×5): qty 1

## 2013-10-03 MED ORDER — FLUOXETINE HCL 20 MG PO CAPS
20.0000 mg | ORAL_CAPSULE | Freq: Every day | ORAL | Status: DC
  Administered 2013-10-03 – 2013-10-07 (×5): 20 mg via ORAL
  Filled 2013-10-03 (×5): qty 1

## 2013-10-03 MED ORDER — ASPIRIN 81 MG PO CHEW
81.0000 mg | CHEWABLE_TABLET | Freq: Every day | ORAL | Status: DC
  Administered 2013-10-03 – 2013-10-06 (×4): 81 mg via ORAL
  Filled 2013-10-03 (×4): qty 1

## 2013-10-03 MED ORDER — DOFETILIDE 250 MCG PO CAPS
250.0000 ug | ORAL_CAPSULE | Freq: Two times a day (BID) | ORAL | Status: DC
  Administered 2013-10-03 – 2013-10-07 (×8): 250 ug via ORAL
  Filled 2013-10-03 (×13): qty 1

## 2013-10-03 MED ORDER — LOSARTAN POTASSIUM 50 MG PO TABS
100.0000 mg | ORAL_TABLET | Freq: Every day | ORAL | Status: DC
  Administered 2013-10-04 – 2013-10-07 (×4): 100 mg via ORAL
  Filled 2013-10-03 (×4): qty 2

## 2013-10-03 NOTE — Progress Notes (Signed)
Echocardiogram Performed. 

## 2013-10-03 NOTE — Assessment & Plan Note (Addendum)
Over presents today for further evaluation of her shortness of breath and pulmonary hypertension. We started her on diltiazem during the last visit but she clearly has not improved on the diltiazem. Echocardiogram today reveals well-preserved left ventricular systolic function. She does have evidence of diastolic dysfunction. She has moderate to severe pulmonary hypertension with an estimated PA pressure of 70.  She's cyanotic today . She's better after nasal cannula O2. She also has increasing leg edema.  This point I think that she needs to be admitted to the hospital. I not convinced that we'll be we'll diurese her effectively using oral medications. She will need a right & left heart catheterization but I would like to diuresis her  prior to that. She'll need an echocardiogram.  Will give her lasix 40 iv BID. Continue KCl, increased 20 mg twice a day Losartan 100 mg a day Tikosyn 250 mcg BID  Check labs. Check TSH Will set her up for a cath on Monday.

## 2013-10-03 NOTE — Progress Notes (Signed)
SPO2 AT REST NO O2 WAS 84%.  O2@ 2L [place] APPLIED.  SPO2 2L VIA [place] AT REST // 93% SPO2 WITH O2 2 L AMBULATING > 100 FT 90%. SPO2 WITH O2 2 L AT REST 92%

## 2013-10-03 NOTE — Progress Notes (Signed)
Heart Failure packet explained and given to pt; pt will watch video once videos are back up.  Carollee Sires, RN

## 2013-10-03 NOTE — H&P (Addendum)
[name] Date of Birth              [birth year] [facility] [address]    [place], [place]  [address] [phone]              Fax  [phone]   Problem list: 1. Atrial fibrillation 2. Status post pacemaker implantation 3. Coronary artery disease-status post PTCA and stenting of her right coronary artery 4. Hyperlipidemia 5. Hypertension 6. Obstructive sleep apnea-currently on CPAP  History of Present Illness:  [name] is a 68 year old female with a history of atrial fibrillation. She status post pacemaker implantation. She also has a history of coronary artery disease and is status post PTCA and stenting of the right coronary artery. She also has a history of hyperlipidemia, hypertension, and sleep apnea.  She uses a CPAP mask at night.  The CPAP mask has really helped her stay in normal sinus rhythm.  She complains of having some episodes of chest tightness. This occurs at times. One episode was relieved with sublingual nitroglycerin. She states that it is not nearly as severe as her previous episodes of angina.  January 13, 21014: She was hospitalized in November with severe shortness breath and "double pneumonia". She is slowly recovering.  She's looking forward to  getting back into her exercise program.  She has not been exercising at all.  She's had some leg edema.  She denies any chest pain or shortness breath.  She's has mild leg swelling for the past several months. She does not add salt any of her food. She tries to stay away from salty foods and processed meat.  September 18, 2013:  She remains short of breath  - especialy with exertion  Echo in Jan. 2014:    - Left ventricle: The cavity size was normal. Wall thickness was increased in a pattern of mild LVH. Systolic function was normal. The estimated ejection fraction was in the range of 60% to 65%. - Pulmonary arteries: PA peak pressure: 64mm Hg    October 03, 2013:  Level was seen about 2 weeks  ago with some increasing shortness of breath. She was noted to have moderate pulmonary hypertension on echo. She started on diltiazem. She has fairly well preserved left ventricle systolic function.  We started her on Cardizem XL 180 mg a day. She has not done well since that medication in addition. She's more short of breath. She's gained about 5 pounds.   She presents today for her echocardiogram. She is very short of breath and was worked into the schedule. Her O2 saturations at rest were 86%. We placed  oxygen on her and her O2 saturations increased above 90%. She is clearly more short of breath after the addition of the diltiazem.   She has not been eating any extra salt .  Has been taking her lasix.    She denies any chest pain - specifically was asked about pleuretic CP.  Had orthopnea until we placed the oxygen on her.   ROS:   She has had worsening dyspnea since we started Diltiazem 2 weeks ago No CP, no pleuretic CP,   No fever, no cough. Very dyspneic - especially climbing stairs.      Current Outpatient Prescriptions on File Prior to Visit   Medication  Sig  Dispense  Refill   .  atorvastatin (LIPITOR) 40 MG tablet  TAKE 1 TABLET EVERY DAY   90 tablet   3   .  b complex vitamins tablet  Take 1 tablet by mouth daily.         .  dabigatran (PRADAXA) 150 MG CAPS capsule  TAKE 1 CAPSULE EVERY 12 HOURS   180 capsule   3   .  diltiazem (DILACOR XR) 180 MG 24 hr capsule  Take 1 capsule (180 mg total) by mouth daily.   90 capsule   3   .  dofetilide (TIKOSYN) 250 MCG capsule  Take 1 capsule (250 mcg total) by mouth 2 (two) times daily.   180 capsule   3   .  FLUoxetine (PROZAC) 20 MG capsule  TAKE ONE CAPSULE IN THE MORNING   90 capsule   0   .  furosemide (LASIX) 40 MG tablet  Take 1 tablet (40 mg total) by mouth daily.   90 tablet   3   .  hydrocortisone-pramoxine (ANALPRAM-HC) 2.5-1 % rectal cream  Please use at bedtimeas needed   30 [name]   0   .  ketoconazole (NIZORAL) 2 % cream  Apply  to rash two times a day for up to 2 weeks   15 [name]   1   .  losartan (COZAAR) 100 MG tablet  Take 1 tablet (100 mg total) by mouth daily.   90 tablet   3   .  Lysine HCl 1000 MG TABS  Take 1 tablet by mouth as needed.          .  metoprolol succinate (TOPROL-XL) 100 MG 24 hr tablet  TAKE ONE TABLET IN THE MORNING AND 1/2 TABLET IN THE EVENING   45 tablet   3   .  Multiple Vitamin (MULTIVITAMIN) tablet  Take 1 tablet by mouth daily.           .  nitroGLYCERIN (NITROSTAT) 0.4 MG SL tablet  Place 1 tablet (0.4 mg total) under the tongue every 5 (five) minutes as needed.   25 tablet   5   .  NON FORMULARY  at bedtime. cpap          .  potassium chloride SA (KLOR-CON M20) 20 MEQ tablet  TAKE 1 TABLET IN THE MORNING   90 tablet   3   .  valACYclovir (VALTREX) 1000 MG tablet  Take 2 by mouth and repeat in 12 hours for cold sores   30 tablet   3   .  vitamin B-12 (CYANOCOBALAMIN) 100 MCG tablet  Take 50 mcg by mouth daily.         [name]  VITAMIN D PO  Take 5,000 mg by mouth daily.          .  vitamin E 1000 UNIT capsule  Take 1,000 Units by mouth daily.             No current facility-administered medications on file prior to visit.     No Known Allergies    Past Medical History   Diagnosis  Date   .  History of CVA (cerebrovascular accident)     .  CAD (coronary artery disease)  [id number]       post PTCA with bare-metal stenting to mid RCA in December 2004      .  Hypertension     .  Chronic atrial fibrillation  06/2007       Tachybradycardia pacemaker   .  Pacemaker     .  OSA on CPAP     .  Anticoagulant long-term use  pradaxa   .  Transfusion history     .  Hyperlipidemia     .  Edema of lower extremity     .  History of acute inferior wall MI     .  Anxiety     .  Diplopia  06/19/2008       Qualifier: Diagnosis of  By: Samantha Bill MD, [name]    .  Unspecified hemorrhoids without mention of complication  [id number]       Colonoscopy--Dr. Carlean [name]        Past Surgical History    Procedure  Laterality  Date   .  Total abdominal hysterectomy    1984       2 surgeries endometriosis bso   .  Permanent pacemaker    06/2007   .  Back surgeries           x2-3 by Dr Samantha Gammon   .  Coronary stent placement           C stent   .  Cholecystectomy    1984   .  Coronary angioplasty with stent placement    1998   .  Doppler echocardiography    2009       History   Smoking status   .  Former Smoker -- 1.00 packs/day for 5 years   .  Types:  Cigarettes   .  Start date:  05/11/1978   .  Quit date:  07/03/1982   Smokeless tobacco   .  Never Used       History   Alcohol Use   .  Yes       Comment: once q 6 months-socailly-wine      Family History   Problem  Relation  Age of Onset   .  Suicidality  Father         suicide death pt was 3 yrs   .  Arrhythmia  Mother         heart arrthymia   .  Hypertension  Mother     .  Heart attack  Brother         died age 53   .  Heart disease  Paternal Aunt     .  Prostate cancer  Maternal Grandfather     .  Diabetes  Paternal Grandfather         fathers side of the family   .  Diabetes  Mother       Reviw of Systems:   Reviewed in the HPI.  All other systems are negative.  Physical Exam: BP 148/80  Pulse 68  Ht 5' 6.5" (1.689 m)  Wt 251 lb (113.853 kg)  BMI 39.91 kg/m2  SpO2 84% The patient is alert and oriented x 3.  Lips are cyanotic    HEENT:   She has normal carotids.  + JVP = 10-12.  Her mucous membranes are moist. Lungs: Lung exam is clear.  Heart: Regular rate S1-S2.  No S3 hear Abdomen: Her abdomen is soft. She has good bowel sounds. Extremities:  She has 1+-2+  ankle edema. Neuro:  Her gait is normal. Her neuro exam is nonfocal.    ECG: September 18, 2013:  NSR , NS ST abnormalities.  Assessment / Plan:      1.   Pulmonary hypertension -  Princesa  presents today for further evaluation of her shortness of breath and pulmonary hypertension. We started her on diltiazem during the last visit  but she clearly  has not improved on the diltiazem. Echocardiogram today reveals well-preserved left ventricular systolic function. She does have evidence of diastolic dysfunction. She has moderate to severe pulmonary hypertension with an estimated PA pressure of 70.  Her PA pressures have increased since her last echo.  She's cyanotic today . She's better after nasal cannula O2. She also has increasing leg edema.  This point I think that she needs to be admitted to the hospital. I not convinced that we'll be we'll diurese her effectively using oral medications. She will need a right & left heart catheterization but I would like to diuresis her  prior to that. She'll need an echocardiogram.   Will give her lasix 40 iv BID. Continue KCl, increased 20 mg twice a day Losartan 100 mg a day Tikosyn 250 mcg BID   Check labs. Check TSH Continue Pradaxa Will consider R and L cath on Monday.  Will have the rounding team decide if she is ready on Sunday.  Will need to hold Pradaxa if she is she is scheduled for cath.     2. DVT prophylaxis:  She is on Pradaxa    Thayer Headings, Brooke Bonito., MD, [facility] 10/03/2013, 5:40 PM Office - [phone] Pager [phone]

## 2013-10-03 NOTE — Patient Instructions (Signed)
GO TO MAIN ENTRANCE FOR DIRECT ADMIT. WILL BE GOING TO 2 WEST

## 2013-10-03 NOTE — Progress Notes (Signed)
[name] Date of Birth  01/10/1946 [facility] [address]    [place], [place]  [address] [phone]  Fax  [phone]   Problem list: 1. Atrial fibrillation 2. Status post pacemaker implantation 3. Coronary artery disease-status post PTCA and stenting of her right coronary artery 4. Hyperlipidemia 5. Hypertension 6. Obstructive sleep apnea-currently on CPAP  History of Present Illness:  [name] is a 68 year old female with a history of atrial fibrillation. She status post pacemaker implantation. She also has a history of coronary artery disease and is status post PTCA and stenting of the right coronary artery. She also has a history of hyperlipidemia, hypertension, and sleep apnea.  She uses a CPAP mask at night.  The CPAP mask has really helped her stay in normal sinus rhythm.  She complains of having some episodes of chest tightness. This occurs at times. One episode was relieved with sublingual nitroglycerin. She states that it is not nearly as severe as her previous episodes of angina.  January 13, 21014: She was hospitalized in November with severe shortness breath and "double pneumonia". She is slowly recovering.  She's looking forward to  getting back into her exercise program.  She has not been exercising at all.  She's had some leg edema.  She denies any chest pain or shortness breath.  She's has mild leg swelling for the past several months. She does not add salt any of her food. She tries to stay away from salty foods and processed meat.  September 18, 2013:  She remains short of breath  - especialy with exertion  Echo in Jan. 2014:   - Left ventricle: The cavity size was normal. Wall thickness was increased in a pattern of mild LVH. Systolic function was normal. The estimated ejection fraction was in the range of 60% to 65%. - Pulmonary arteries: PA peak pressure: 16mm Hg   October 03, 2013:  Level was seen about 2 weeks ago with some increasing  shortness of breath. She was noted to have moderate pulmonary hypertension on echo. She started on diltiazem. She has fairly well preserved left ventricle systolic function.  We started her on Cardizem XL 180 mg a day. She has not done well since that medication in addition. She's more short of breath. She's gained about 5 pounds.  She presents today for her echocardiogram. She is very short of breath and was worked into the schedule. Her O2 saturations at rest were 86%. We placed  oxygen on her and her O2 saturations increased above 90%. She is clearly more short of breath after the addition of the diltiazem.   She has not been eating any extra salt .  Has been taking her lasix.   She denies any chest pain - specifically was asked about pleuretic CP.  Had orthopnea until we placed the oxygen on her.   ROS:    She has had worsening dyspnea since we started Diltiazem 2 weeks ago No CP, no pleuretic CP,  No fever, no cough. Very dyspneic - especially climbing stairs.    Current Outpatient Prescriptions on File Prior to Visit  Medication Sig Dispense Refill  . atorvastatin (LIPITOR) 40 MG tablet TAKE 1 TABLET EVERY DAY  90 tablet  3  . b complex vitamins tablet Take 1 tablet by mouth daily.      . dabigatran (PRADAXA) 150 MG CAPS capsule TAKE 1 CAPSULE EVERY 12 HOURS  180 capsule  3  . diltiazem (DILACOR XR) 180 MG  24 hr capsule Take 1 capsule (180 mg total) by mouth daily.  90 capsule  3  . dofetilide (TIKOSYN) 250 MCG capsule Take 1 capsule (250 mcg total) by mouth 2 (two) times daily.  180 capsule  3  . FLUoxetine (PROZAC) 20 MG capsule TAKE ONE CAPSULE IN THE MORNING  90 capsule  0  . furosemide (LASIX) 40 MG tablet Take 1 tablet (40 mg total) by mouth daily.  90 tablet  3  . hydrocortisone-pramoxine (ANALPRAM-HC) 2.5-1 % rectal cream Please use at bedtimeas needed  30 g  0  . ketoconazole (NIZORAL) 2 % cream Apply to rash two times a day for up to 2 weeks  15 g  1  . losartan (COZAAR) 100  MG tablet Take 1 tablet (100 mg total) by mouth daily.  90 tablet  3  . Lysine HCl 1000 MG TABS Take 1 tablet by mouth as needed.       . metoprolol succinate (TOPROL-XL) 100 MG 24 hr tablet TAKE ONE TABLET IN THE MORNING AND 1/2 TABLET IN THE EVENING  45 tablet  3  . Multiple Vitamin (MULTIVITAMIN) tablet Take 1 tablet by mouth daily.        . nitroGLYCERIN (NITROSTAT) 0.4 MG SL tablet Place 1 tablet (0.4 mg total) under the tongue every 5 (five) minutes as needed.  25 tablet  5  . NON FORMULARY at bedtime. cpap       . potassium chloride SA (KLOR-CON M20) 20 MEQ tablet TAKE 1 TABLET IN THE MORNING  90 tablet  3  . valACYclovir (VALTREX) 1000 MG tablet Take 2 by mouth and repeat in 12 hours for cold sores  30 tablet  3  . vitamin B-12 (CYANOCOBALAMIN) 100 MCG tablet Take 50 mcg by mouth daily.      [name] VITAMIN D PO Take 5,000 mg by mouth daily.       . vitamin E 1000 UNIT capsule Take 1,000 Units by mouth daily.       No current facility-administered medications on file prior to visit.    No Known Allergies  Past Medical History  Diagnosis Date  . History of CVA (cerebrovascular accident)   . CAD (coronary artery disease) [id number]    post PTCA with bare-metal stenting to mid RCA in December 2004     . Hypertension   . Chronic atrial fibrillation 06/2007    Tachybradycardia pacemaker  . Pacemaker   . OSA on CPAP   . Anticoagulant long-term use     pradaxa  . Transfusion history   . Hyperlipidemia   . Edema of lower extremity   . History of acute inferior wall MI   . Anxiety   . Diplopia 06/19/2008    Qualifier: Diagnosis of  By: [name] MD, [name]   . Unspecified hemorrhoids without mention of complication [id number]    Colonoscopy--Dr. Carlean Purl     Past Surgical History  Procedure Laterality Date  . Total abdominal hysterectomy  1984    2 surgeries endometriosis bso  . Permanent pacemaker  06/2007  . Back surgeries      x2-3 by Dr Trenton Gammon  . Coronary stent placement      C  stent  . Cholecystectomy  1984  . Coronary angioplasty with stent placement  1998  . Doppler echocardiography  2009    History  Smoking status  . Former Smoker -- 1.00 packs/day for 5 years  . Types: Cigarettes  . Start date: 05/11/1978  .  Quit date: 07/03/1982  Smokeless tobacco  . Never Used    History  Alcohol Use  . Yes    Comment: once q 6 months-socailly-wine    Family History  Problem Relation Age of Onset  . Suicidality Father     suicide death pt was 3 yrs  . Arrhythmia Mother     heart arrthymia  . Hypertension Mother   . Heart attack Brother     died age 39  . Heart disease Paternal Aunt   . Prostate cancer Maternal Grandfather   . Diabetes Paternal Grandfather     fathers side of the family  . Diabetes Mother     Reviw of Systems:  Reviewed in the HPI.  All other systems are negative.  Physical Exam: BP 148/80  Pulse 68  Ht 5' 6.5" (1.689 m)  Wt 251 lb (113.853 kg)  BMI 39.91 kg/m2  SpO2 84% The patient is alert and oriented x 3.  Lips are cyanotic   HEENT:   She has normal carotids.  + JVP = 10-12.  Her mucous membranes are moist.  Lungs: Lung exam is clear.   Heart: Regular rate S1-S2.  No S3 heard  Abdomen: Her abdomen is soft. She has good bowel sounds.  Extremities:  She has 1+-2+  ankle edema.  Neuro:  Her gait is normal. Her neuro exam is nonfocal.    ECG: September 18, 2013:  NSR , NS ST abnormalities.   Assessment / Plan:

## 2013-10-04 DIAGNOSIS — I5031 Acute diastolic (congestive) heart failure: Secondary | ICD-10-CM | POA: Diagnosis present

## 2013-10-04 LAB — BASIC METABOLIC PANEL
BUN: 17 mg/dL (ref 6–23)
CO2: 27 mEq/L (ref 19–32)
Calcium: 9.2 mg/dL (ref 8.4–10.5)
Chloride: 97 mEq/L (ref 96–112)
Creatinine, Ser: 0.78 mg/dL (ref 0.50–1.10)
GFR calc Af Amer: >90 mL/min (ref >90)
GFR calc non Af Amer: 85 mL/min — ABNORMAL LOW (ref >90)
Glucose, Bld: 106 mg/dL — ABNORMAL HIGH (ref 70–99)
Potassium: 4.8 mEq/L (ref 3.7–5.3)
Sodium: 139 mEq/L (ref 137–147)

## 2013-10-04 NOTE — Progress Notes (Signed)
Patient ID: LAREN LAWRIE, female   DOB: [birth year], 68 y.o.   MRN: [id number]   Patient Name: [name] Date of Encounter: 10/04/2013     Active Problems:   * No active hospital problems. *    SUBJECTIVE  No chest pain, dyspnea improved, edema improved. CURRENT MEDS . aspirin  81 mg Oral Daily  . atorvastatin  40 mg Oral q1800  . dabigatran  150 mg Oral Q12H  . dofetilide  250 mcg Oral BID  . FLUoxetine  20 mg Oral Daily  . furosemide  40 mg Intravenous BID  . losartan  100 mg Oral Daily  . metoprolol succinate  150 mg Oral Daily  . potassium chloride  20 mEq Oral BID  . potassium chloride  20 mEq Oral BID  . valACYclovir  1,000 mg Oral BID    OBJECTIVE  Filed Vitals:   10/03/13 1409 10/03/13 2136 10/04/13 0448 10/04/13 0758  BP: 130/69 149/64 119/45 126/77  Pulse: 58 70 65 67  Temp: 98 F (36.7 C) 99.1 F (37.3 C) 99.5 F (37.5 C)   TempSrc: Oral Oral Oral   Resp: 16 20 20    Height: 5\' 6"  (1.676 m)     Weight: 247 lb 1.6 oz (112.084 kg)  244 lb 11.4 oz (111 kg)   SpO2: 94% 95% 92%     Intake/Output Summary (Last 24 hours) at 10/04/13 1041 Last data filed at 10/04/13 0453  Gross per 24 hour  Intake      0 ml  Output   2200 ml  Net  -2200 ml   Filed Weights   10/03/13 1409 10/04/13 0448  Weight: 247 lb 1.6 oz (112.084 kg) 244 lb 11.4 oz (111 kg)    PHYSICAL EXAM  General: Pleasant, obese 68 yo woman,NAD. Neuro: Alert and oriented X 3. Moves all extremities spontaneously. Psych: Normal affect. HEENT:  Normal  Neck: Supple without bruits or JVD. Lungs:  Resp regular and unlabored, CTA except for basilar rales Heart: RRR no s3, s4, or murmurs. Abdomen: Soft, non-tender, non-distended, BS + x 4.  Extremities: No clubbing, cyanosis or 2+ edema. DP/PT/Radials 2+ and equal bilaterally.  Accessory Clinical Findings  CBC  Recent Labs  10/03/13 1545  WBC 11.8*  NEUTROABS 7.4  HGB 10.8*  HCT 33.2*  MCV 92.2  PLT [id number]   Basic Metabolic  Panel  Recent Labs  10/03/13 1545 10/04/13 0457  NA 136* 139  K 4.1 4.8  CL 97 97  CO2 25 27  GLUCOSE 139* 106*  BUN 19 17  CREATININE 0.69 0.78  CALCIUM 9.0 9.2  MG 2.2  --    Liver Function Tests  Recent Labs  10/03/13 1545  AST 27  ALT 33  ALKPHOS 132*  BILITOT 0.8  PROT 7.1  ALBUMIN 3.3*   No results found for this basename: LIPASE, AMYLASE,  in the last 72 hours Cardiac Enzymes No results found for this basename: CKTOTAL, CKMB, CKMBINDEX, TROPONINI,  in the last 72 hours BNP No components found with this basename: POCBNP,  D-Dimer No results found for this basename: DDIMER,  in the last 72 hours Hemoglobin A1C No results found for this basename: HGBA1C,  in the last 72 hours Fasting Lipid Panel No results found for this basename: CHOL, HDL, LDLCALC, TRIG, CHOLHDL, LDLDIRECT,  in the last 72 hours Thyroid Function Tests  Recent Labs  10/03/13 1545  TSH 3.380    TELE nsr  ECG nsr  Radiology/Studies  No  results found.  ASSESSMENT AND PLAN 1.acute on chronic diastolic heart failure 2. CAD 3. PAF, controlled on Tikosyn 4. Obesity 5. Symptomatic brady, s/p PPM remotely, maintaining NSR REC: as per Dr. Elmarie Shiley note. Will diurese and plan right and left heart cath on Monday. Will stop anti-coagulation tomorrow.  Dayyan Krist,M.D.  Breindel Collier,M.D.  10/04/2013 10:41 AM

## 2013-10-04 NOTE — Progress Notes (Signed)
   CARE MANAGEMENT NOTE 10/04/2013  Patient:  [name], [name]   Account Number:  [id number]  Date Initiated:  10/04/2013  Documentation initiated by:  [facility]  Subjective/Objective Assessment:   adm: chest tightness     Action/Plan:   discharge planning   Anticipated DC Date:  10/07/2013   Anticipated DC Plan:        [place]  CM consult      [facility] Choice  HOME HEALTH   Choice offered to / List presented to:  C-1 Patient        [place] arranged  HH-1 RN  [place] agency  [place]   Status of service:  In process, will continue to follow Medicare Important Message given?   (If response is "NO", the following Medicare IM given date fields will be blank) Date Medicare IM given:   Date Additional Medicare IM given:    Discharge Disposition:  [place]  Per UR Regulation:    If discussed at Long Length of Stay Meetings, dates discussed:    Comments:  10/04/13 08:15 CM spoke with pt and pt's husband in room to offer choice.  [place] chosen to render [facility] for disease management of CHF.  Referral called to [facility] [place].  CM will continue to follow pt's progress for other needs.  Mariane Masters, BSN, CM [phone].

## 2013-10-05 LAB — BASIC METABOLIC PANEL
BUN: 17 mg/dL (ref 6–23)
CO2: 26 mEq/L (ref 19–32)
Calcium: 9.1 mg/dL (ref 8.4–10.5)
Chloride: 94 mEq/L — ABNORMAL LOW (ref 96–112)
Creatinine, Ser: 0.85 mg/dL (ref 0.50–1.10)
GFR calc Af Amer: 80 mL/min — ABNORMAL LOW (ref >90)
GFR calc non Af Amer: 69 mL/min — ABNORMAL LOW (ref >90)
Glucose, Bld: 102 mg/dL — ABNORMAL HIGH (ref 70–99)
Potassium: 4.3 mEq/L (ref 3.7–5.3)
Sodium: 135 mEq/L — ABNORMAL LOW (ref 137–147)

## 2013-10-05 NOTE — Progress Notes (Signed)
Patient ID: [name], female   DOB: 12/06/1945, 68 y.o.   MRN: [id number]  Patient Name: [name] Date of Encounter: 10/05/2013     Active Problems:   Acute diastolic heart failure    SUBJECTIVE  No chest pain, dyspnea improved, edema improved. CURRENT MEDS . aspirin  81 mg Oral Daily  . atorvastatin  40 mg Oral q1800  . dabigatran  150 mg Oral Q12H  . dofetilide  250 mcg Oral BID  . FLUoxetine  20 mg Oral Daily  . furosemide  40 mg Intravenous BID  . losartan  100 mg Oral Daily  . metoprolol succinate  150 mg Oral Daily  . potassium chloride  20 mEq Oral BID  . potassium chloride  20 mEq Oral BID  . valACYclovir  1,000 mg Oral BID    OBJECTIVE  Filed Vitals:   10/04/13 2045 10/04/13 2110 10/05/13 0536 10/05/13 0700  BP: 138/63  149/71 133/64  Pulse: 72 84 66 64  Temp: 100 F (37.8 C)  98.1 F (36.7 C) 98.2 F (36.8 C)  TempSrc: Oral  Oral   Resp: 18 18 18    Height:      Weight:   241 lb 8 oz (109.544 kg)   SpO2: 93% 92% 94% 93%    Intake/Output Summary (Last 24 hours) at 10/05/13 1000 Last data filed at 10/05/13 0932  Gross per 24 hour  Intake    720 ml  Output   4100 ml  Net  -3380 ml   Filed Weights   10/03/13 1409 10/04/13 0448 10/05/13 0536  Weight: 247 lb 1.6 oz (112.084 kg) 244 lb 11.4 oz (111 kg) 241 lb 8 oz (109.544 kg)    PHYSICAL EXAM  General: Pleasant, obese 68 yo woman,NAD. Neuro: Alert and oriented X 3. Moves all extremities spontaneously. HEENT:  Normal  Neck: Supple without bruits or JVD. Lungs:  Resp regular and unlabored, CTA except for basilar rales Heart: RRR no s3, s4, or murmurs. Abdomen: Soft, non-tender, non-distended, BS + x 4.  Extremities: No clubbing, cyanosis or 2+ edema. DP/PT/Radials 2+ and equal bilaterally.  Accessory Clinical Findings  CBC  Recent Labs  10/03/13 1545  WBC 11.8*  NEUTROABS 7.4  HGB 10.8*  HCT 33.2*  MCV 92.2  PLT [id number]   Basic Metabolic Panel  Recent Labs  10/03/13 1545  10/04/13 0457 10/05/13 0330  NA 136* 139 135*  K 4.1 4.8 4.3  CL 97 97 94*  CO2 25 27 26   GLUCOSE 139* 106* 102*  BUN 19 17 17   CREATININE 0.69 0.78 0.85  CALCIUM 9.0 9.2 9.1  MG 2.2  --   --    Liver Function Tests  Recent Labs  10/03/13 1545  AST 27  ALT 33  ALKPHOS 132*  BILITOT 0.8  PROT 7.1  ALBUMIN 3.3*   No results found for this basename: LIPASE, AMYLASE,  in the last 72 hours Cardiac Enzymes No results found for this basename: CKTOTAL, CKMB, CKMBINDEX, TROPONINI,  in the last 72 hours BNP No components found with this basename: POCBNP,  D-Dimer No results found for this basename: DDIMER,  in the last 72 hours Hemoglobin A1C No results found for this basename: HGBA1C,  in the last 72 hours Fasting Lipid Panel No results found for this basename: CHOL, HDL, LDLCALC, TRIG, CHOLHDL, LDLDIRECT,  in the last 72 hours Thyroid Function Tests  Recent Labs  10/03/13 1545  TSH 3.380    TELE nsr  ECG  nsr  Radiology/Studies  No results found.  ASSESSMENT AND PLAN 1.acute on chronic diastolic heart failure 2. CAD 3. PAF, controlled on Tikosyn 4. Obesity 5. Symptomatic brady, s/p PPM remotely, maintaining NSR REC: as per Dr. Elmarie Shiley note. Will diurese and plan right and left heart cath on Monday. Will stop anti-coagulation today. She is improved.  Quandre Polinski,M.D.  10/05/2013 10:00 AM

## 2013-10-05 NOTE — Progress Notes (Signed)
RT Note: Pt has home cpap at bedside and has been wearing. Pt states she does not need further assistance from RT. Will continue to monitor

## 2013-10-06 ENCOUNTER — Encounter (HOSPITAL_COMMUNITY)
Admission: AD | Disposition: A | Discharge status: Home / Self Care | Payer: Self-pay | Source: Ambulatory Visit | Attending: Cardiovascular Disease

## 2013-10-06 DIAGNOSIS — E785 Hyperlipidemia, unspecified: Secondary | ICD-10-CM

## 2013-10-06 DIAGNOSIS — I4891 Unspecified atrial fibrillation: Secondary | ICD-10-CM

## 2013-10-06 DIAGNOSIS — I2510 Atherosclerotic heart disease of native coronary artery without angina pectoris: Secondary | ICD-10-CM

## 2013-10-06 HISTORY — PX: LEFT AND RIGHT HEART CATHETERIZATION WITH CORONARY ANGIOGRAM: SHX5449

## 2013-10-06 LAB — BASIC METABOLIC PANEL
BUN: 23 mg/dL (ref 6–23)
BUN: 22 mg/dL (ref 6–23)
CO2: 26 mEq/L (ref 19–32)
CO2: 28 mEq/L (ref 19–32)
Calcium: 9.1 mg/dL (ref 8.4–10.5)
Calcium: 9.6 mg/dL (ref 8.4–10.5)
Chloride: 94 mEq/L — ABNORMAL LOW (ref 96–112)
Chloride: 93 mEq/L — ABNORMAL LOW (ref 96–112)
Creatinine, Ser: 0.85 mg/dL (ref 0.50–1.10)
Creatinine, Ser: 0.86 mg/dL (ref 0.50–1.10)
GFR calc Af Amer: 80 mL/min — ABNORMAL LOW (ref >90)
GFR calc Af Amer: 79 mL/min — ABNORMAL LOW (ref >90)
GFR calc non Af Amer: 69 mL/min — ABNORMAL LOW (ref >90)
GFR calc non Af Amer: 68 mL/min — ABNORMAL LOW (ref >90)
Glucose, Bld: 173 mg/dL — ABNORMAL HIGH (ref 70–99)
Glucose, Bld: 109 mg/dL — ABNORMAL HIGH (ref 70–99)
Potassium: 4.4 mEq/L (ref 3.7–5.3)
Potassium: 4 mEq/L (ref 3.7–5.3)
Sodium: 136 mEq/L — ABNORMAL LOW (ref 137–147)
Sodium: 137 mEq/L (ref 137–147)

## 2013-10-06 LAB — POCT I-STAT 3, ART BLOOD GAS (G3+)
Acid-Base Excess: 3 mmol/L — ABNORMAL HIGH (ref 0.0–2.0)
Bicarbonate: 27.7 mEq/L — ABNORMAL HIGH (ref 20.0–24.0)
O2 Saturation: 89 %
TCO2: 29 mmol/L (ref 0–100)
pCO2 arterial: 42.6 mmHg (ref 35.0–45.0)
pH, Arterial: 7.421 (ref 7.350–7.450)
pO2, Arterial: 56 mmHg — ABNORMAL LOW (ref 80.0–100.0)

## 2013-10-06 LAB — POCT I-STAT 3, VENOUS BLOOD GAS (G3P V)
Acid-Base Excess: 3 mmol/L — ABNORMAL HIGH (ref 0.0–2.0)
Bicarbonate: 29 mEq/L — ABNORMAL HIGH (ref 20.0–24.0)
O2 Saturation: 61 %
TCO2: 30 mmol/L (ref 0–100)
pCO2, Ven: 47.3 mmHg (ref 45.0–50.0)
pH, Ven: 7.394 — ABNORMAL HIGH (ref 7.250–7.300)
pO2, Ven: 32 mmHg (ref 30.0–45.0)

## 2013-10-06 LAB — PROTIME-INR
INR: 1.3 (ref 0.00–1.49)
Prothrombin Time: 15.9 seconds — ABNORMAL HIGH (ref 11.6–15.2)

## 2013-10-06 SURGERY — LEFT AND RIGHT HEART CATHETERIZATION WITH CORONARY ANGIOGRAM
Anesthesia: LOCAL

## 2013-10-06 MED ORDER — POTASSIUM CHLORIDE CRYS ER 20 MEQ PO TBCR
20.0000 meq | EXTENDED_RELEASE_TABLET | Freq: Every morning | ORAL | Status: DC
  Administered 2013-10-06 – 2013-10-07 (×2): 20 meq via ORAL
  Filled 2013-10-06: qty 1

## 2013-10-06 MED ORDER — SODIUM CHLORIDE 0.9 % IV SOLN
INTRAVENOUS | Status: DC
  Administered 2013-10-06: 10:00:00 via INTRAVENOUS

## 2013-10-06 MED ORDER — MIDAZOLAM HCL 2 MG/2ML IJ SOLN
INTRAMUSCULAR | Status: AC
Start: 2013-10-06 — End: 2013-10-06
  Filled 2013-10-06: qty 2

## 2013-10-06 MED ORDER — FLUOXETINE HCL 20 MG PO CAPS: 20.0000 mg | ORAL_CAPSULE | Freq: Every day | ORAL | Status: DC

## 2013-10-06 MED ORDER — METOPROLOL SUCCINATE ER 100 MG PO TB24
100.0000 mg | ORAL_TABLET | Freq: Every day | ORAL | Status: DC
  Administered 2013-10-06 – 2013-10-07 (×2): 100 mg via ORAL
  Filled 2013-10-06 (×2): qty 1

## 2013-10-06 MED ORDER — B COMPLEX PO TABS: 1.0000 | ORAL_TABLET | Freq: Every day | ORAL | Status: DC

## 2013-10-06 MED ORDER — DABIGATRAN ETEXILATE MESYLATE 150 MG PO CAPS
150.0000 mg | ORAL_CAPSULE | Freq: Two times a day (BID) | ORAL | Status: DC
  Administered 2013-10-06 – 2013-10-07 (×3): 150 mg via ORAL
  Filled 2013-10-06 (×4): qty 1

## 2013-10-06 MED ORDER — ATORVASTATIN CALCIUM 40 MG PO TABS: 40.0000 mg | ORAL_TABLET | Freq: Every morning | ORAL | Status: DC

## 2013-10-06 MED ORDER — LIDOCAINE HCL (PF) 1 % IJ SOLN
INTRAMUSCULAR | Status: AC
  Filled 2013-10-06: qty 30

## 2013-10-06 MED ORDER — B COMPLEX-C PO TABS
1.0000 | ORAL_TABLET | Freq: Every day | ORAL | Status: DC
  Administered 2013-10-06 – 2013-10-07 (×2): 1 via ORAL
  Filled 2013-10-06 (×2): qty 1

## 2013-10-06 MED ORDER — DILTIAZEM HCL ER 180 MG PO CP24
180.0000 mg | ORAL_CAPSULE | Freq: Every day | ORAL | Status: DC
  Administered 2013-10-06 – 2013-10-07 (×2): 180 mg via ORAL
  Filled 2013-10-06 (×2): qty 1

## 2013-10-06 MED ORDER — ADULT MULTIVITAMIN W/MINERALS CH
1.0000 | ORAL_TABLET | Freq: Every day | ORAL | Status: DC
  Administered 2013-10-06 – 2013-10-07 (×2): 1 via ORAL
  Filled 2013-10-06 (×2): qty 1

## 2013-10-06 MED ORDER — VITAMIN D 1000 UNITS PO TABS
5000.0000 [IU] | ORAL_TABLET | Freq: Every morning | ORAL | Status: DC
  Administered 2013-10-06 – 2013-10-07 (×2): 5000 [IU] via ORAL
  Filled 2013-10-06 (×2): qty 5

## 2013-10-06 MED ORDER — VITAMIN E 45 MG (100 UNIT) PO CAPS
1000.0000 [IU] | ORAL_CAPSULE | Freq: Every day | ORAL | Status: DC
  Administered 2013-10-06 – 2013-10-07 (×2): 1000 [IU] via ORAL
  Filled 2013-10-06 (×3): qty 2

## 2013-10-06 MED ORDER — NITROGLYCERIN 0.2 MG/ML ON CALL CATH LAB
INTRAVENOUS | Status: AC
  Filled 2013-10-06: qty 1

## 2013-10-06 MED ORDER — VITAMIN B-12 100 MCG PO TABS
50.0000 ug | ORAL_TABLET | Freq: Every day | ORAL | Status: DC
  Administered 2013-10-06 – 2013-10-07 (×2): 50 ug via ORAL
  Filled 2013-10-06 (×2): qty 1

## 2013-10-06 MED ORDER — KETOCONAZOLE 2 % EX CREA
1.0000 | TOPICAL_CREAM | Freq: Two times a day (BID) | CUTANEOUS | Status: DC
Start: 2013-10-06 — End: 2013-10-07
  Administered 2013-10-06: 1 via TOPICAL
  Filled 2013-10-06: qty 15

## 2013-10-06 MED ORDER — LOSARTAN POTASSIUM 50 MG PO TABS: 100.0000 mg | ORAL_TABLET | Freq: Every day | ORAL | Status: DC

## 2013-10-06 MED ORDER — FUROSEMIDE 40 MG PO TABS
40.0000 mg | ORAL_TABLET | Freq: Every day | ORAL | Status: DC
  Administered 2013-10-06 – 2013-10-07 (×2): 40 mg via ORAL
  Filled 2013-10-06 (×2): qty 1

## 2013-10-06 MED ORDER — METOPROLOL SUCCINATE ER 50 MG PO TB24: 50.0000 mg | ORAL_TABLET | Freq: Two times a day (BID) | ORAL | Status: DC

## 2013-10-06 MED ORDER — HEPARIN (PORCINE) IN NACL 2-0.9 UNIT/ML-% IJ SOLN
INTRAMUSCULAR | Status: AC
  Filled 2013-10-06: qty 1000

## 2013-10-06 MED ORDER — LYSINE HCL 1000 MG PO TABS: 1.0000 | ORAL_TABLET | Freq: Every day | ORAL | Status: DC | PRN

## 2013-10-06 MED ORDER — DOFETILIDE 250 MCG PO CAPS: 250.0000 ug | ORAL_CAPSULE | Freq: Two times a day (BID) | ORAL | Status: DC

## 2013-10-06 MED ORDER — FENTANYL CITRATE 0.05 MG/ML IJ SOLN
INTRAMUSCULAR | Status: AC
  Filled 2013-10-06: qty 2

## 2013-10-06 MED ORDER — SODIUM CHLORIDE 0.9 % IV SOLN: INTRAVENOUS | Status: DC

## 2013-10-06 MED ORDER — NITROGLYCERIN 0.4 MG SL SUBL: 0.4000 mg | SUBLINGUAL_TABLET | SUBLINGUAL | Status: DC | PRN

## 2013-10-06 MED ORDER — ASPIRIN 81 MG PO TABS: 81.0000 mg | ORAL_TABLET | Freq: Every day | ORAL | Status: DC

## 2013-10-06 MED ORDER — METOPROLOL SUCCINATE ER 50 MG PO TB24
50.0000 mg | ORAL_TABLET | Freq: Every day | ORAL | Status: DC
  Administered 2013-10-06: 50 mg via ORAL
  Filled 2013-10-06 (×2): qty 1

## 2013-10-06 NOTE — Interval H&P Note (Signed)
Cath Lab Visit (complete for each Cath Lab visit)  Clinical Evaluation Leading to the Procedure:   ACS: no  Non-ACS:    Anginal Classification: CCS IV  Anti-ischemic medical therapy: Maximal Therapy (2 or more classes of medications)  Non-Invasive Test Results: No non-invasive testing performed  Prior CABG: No previous CABG      History and Physical Interval Note:  10/06/2013 7:53 AM  [name]  has presented today for surgery, with the diagnosis of cp  The various methods of treatment have been discussed with the patient and family. After consideration of risks, benefits and other options for treatment, the patient has consented to  Procedure(s): LEFT AND RIGHT HEART CATHETERIZATION WITH CORONARY ANGIOGRAM (N/A) as a surgical intervention .  The patient's history has been reviewed, patient examined, no change in status, stable for surgery.  I have reviewed the patient's chart and labs.  Questions were answered to the patient's satisfaction.     Xenia Nile A

## 2013-10-06 NOTE — Progress Notes (Signed)
Pt received from cath lab into room 2w05, pt aware of bedrest till 1330, pt states no complaints at this time, right groin SDI, will continue to monitor Rickard Rhymes, RN

## 2013-10-06 NOTE — Consult Note (Signed)
Heart Failure Navigator Consult Note  Presentation: [name]  is a 68 year old female with a history of atrial fibrillation. She status post pacemaker implantation. She also has a history of coronary artery disease and is status post PTCA and stenting of the right coronary artery. She also has a history of hyperlipidemia, hypertension, and sleep apnea. She uses a CPAP mask at night. The CPAP mask has really helped her stay in normal sinus rhythm.  She complains of having some episodes of chest tightness. This occurs at times. One episode was relieved with sublingual nitroglycerin. She states that it is not nearly as severe as her previous episodes of angina.    Past Medical History  Diagnosis Date  . History of CVA (cerebrovascular accident)   . CAD (coronary artery disease) [id number]    post PTCA with bare-metal stenting to mid RCA in December 2004     . Hypertension   . Chronic atrial fibrillation 06/2007    Tachybradycardia pacemaker  . Pacemaker   . OSA on CPAP   . Anticoagulant long-term use     pradaxa  . Transfusion history   . Hyperlipidemia   . Edema of lower extremity   . History of acute inferior wall MI   . Anxiety   . Diplopia 06/19/2008    Qualifier: Diagnosis of  By: [name] MD, [name]   . Unspecified hemorrhoids without mention of complication [id number]    Colonoscopy--Dr. Carlean Purl   . Myocardial infarction   . Dysrhythmia     ATRIAL FIBRILATION  . TIA (transient ischemic attack)   . CHF (congestive heart failure)   . Shortness of breath     History   Social History  . Marital Status: Married    Spouse Name: N/A    Number of Children: 0  . Years of Education: HS   Occupational History  . retired     previously worked [employer]  . Smoking status: Former Smoker -- 1.00 packs/day for 5 years    Types: Cigarettes    Start date: 05/11/1978    Quit date: 07/03/1982  . Smokeless tobacco: Never Used  . Alcohol Use: Yes      Comment: once q 6 months-socailly-wine  . Drug Use: No  . Sexual Activity: None   Other Topics Concern  . 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   Married    Originally from [place] [place] of two, high school education   Former smoker [phone] 1ppd   Hunting dogs 7    Retired from [employer] 2     ECHO:Study Conclusions- 10/03/13  - Left ventricle: The cavity size was normal. Systolic function was normal. The estimated ejection fraction was in the range of 55% to 60%. Wall motion was normal; there were no regional wall motion abnormalities. Features are consistent with a pseudonormal left ventricular filling pattern, with concomitant abnormal relaxation and increased filling pressure (grade 2 diastolic dysfunction). - Mitral valve: Calcified annulus. Mild regurgitation. - Left atrium: The atrium was mildly dilated. - Tricuspid valve: Moderate regurgitation. - Pulmonary arteries: Systolic pressure was moderately to severely increased. PA peak pressure: 25mm Hg (S).   BNP    Component Value Date/Time   PROBNP 1220.0* 10/03/2013 1545    Education Assessment and Provision:  Detailed education and instructions provided on heart failure disease management including the  following:  Signs and symptoms of Heart Failure When to call the physician Importance of daily weights Low sodium diet  Fluid restriction Medication management Anticipated future follow-up appointments  Patient education given on each of the above topics.  Patient acknowledges understanding and acceptance of all instructions. We discussed diet for an extended time.  She articulates that she will be able to substitute low sodium options into her diet and way of cooking.  She also says previously that she was given a prn dose of Lasix outpatient and was unsure when to take it.  She has not been weighing herself at home  yet says that she will now and will not have an issue.  We discussed how weight is good tool to assess her fluid status.    Education Materials:  "Living Better With Heart Failure" Booklet, Daily Weight Tracker Tool and Heart Failure Educational Video.   High Risk Criteria for Readmission and/or Poor Patient Outcomes:    EF <30%- No--55-60%  2 or more admissions in 6 months-No  Difficult social situation- No  Demonstrates medication noncompliance- No    Barriers of Care:  Knowledge of her medical condition  Discharge Planning:   Plans to discharge home with husband.

## 2013-10-06 NOTE — Progress Notes (Signed)
    Subjective:  Feels '100% better." No shortness of breath at rest. No chest discomfort.  Objective:  Vital Signs in the last 24 hours: Temp:  [97.7 F (36.5 C)-98.7 F (37.1 C)] 97.7 F (36.5 C) (04/06 0507) Pulse Rate:  [66-72] 66 (04/06 0507) Resp:  [17-18] 18 (04/06 0507) BP: (128-132)/(51-68) 132/51 mmHg (04/06 0507) SpO2:  [94 %-96 %] 94 % (04/06 0507) Weight:  [239 lb (108.41 kg)] 239 lb (108.41 kg) (04/06 0507)  Intake/Output from previous day: 04/05 0701 - 04/06 0700 In: 960 [P.O.:960] Out: 6700 [Urine:6700]  Physical Exam: Pt is alert and oriented, pleasant obese woman in NAD HEENT: normal Neck: JVP - normal Lungs: Good air movement with end expiratory wheezing present CV: RRR without murmur or gallop Abd: soft, NT, Positive BS, no hepatomegaly Ext: Trace pretibial edema, distal pulses intact and equal Skin: warm/dry no rash   Lab Results:  Recent Labs  10/03/13 1545  WBC 11.8*  HGB 10.8*  PLT 325    Recent Labs  10/06/13 0115 10/06/13 0525  NA 136* 137  K 4.4 4.0  CL 94* 93*  CO2 26 28  GLUCOSE 173* 109*  BUN 23 22  CREATININE 0.85 0.86   No results found for this basename: TROPONINI, CK, MB,  in the last 72 hours  Cardiac Studies: 2-D echocardiogram: Study Conclusions  - Left ventricle: The cavity size was normal. Systolic function was normal. The estimated ejection fraction was in the range of 55% to 60%. Wall motion was normal; there were no regional wall motion abnormalities. Features are consistent with a pseudonormal left ventricular filling pattern, with concomitant abnormal relaxation and increased filling pressure (grade 2 diastolic dysfunction). - Mitral valve: Calcified annulus. Mild regurgitation. - Left atrium: The atrium was mildly dilated. - Tricuspid valve: Moderate regurgitation. - Pulmonary arteries: Systolic pressure was moderately to severely increased. PA peak pressure: 58mm Hg (S).  Tele: Personally  reviewed, normal sinus rhythm.  Assessment/Plan:  1. Acute on chronic diastolic heart failure. She's had a brisk diuresis during this hospitalization as she is net 11 L negative. Her weight is down 8 pounds. Renal function remains stable and within normal limits. She is awaiting right and left heart catheterization this morning. As long as her hemodynamics are within range, would anticipate changing her to oral diuretics today. Will aim for hospital discharge tomorrow pending results of her heart catheterization. She has known pulmonary hypertension but does not appear significantly volume overloaded on exam at this time. Her echocardiogram was reviewed and demonstrates normal LV systolic function.  2. Paroxysmal atrial fibrillation. Maintaining sinus rhythm on Tikosyn. Pradaxa has been on hold for cardiac catheterization.   3. CAD s/p PCI of the RCA. No recent anginal symptoms. Has had angina in the past associated with her CAD. For right and left heart catheterization today in the setting of congestive heart failure evaluation.  4. Hyperlipidemia. Well controlled on current medical program which was reviewed today.  Disposition: Pending right and left heart catheterization results. Would anticipate discharge tomorrow unless hemodynamics suggest continued volume overload/decompensated heart failure. Clinically she is improved.   Sherren Mocha, M.D. 10/06/2013, 7:16 AM

## 2013-10-06 NOTE — CV Procedure (Signed)
[name] is a 68 y.o. female    [id number]  [id number] LOCATION:  FACILITY: [facility]  PHYSICIAN: Troy Sine, MD, [facility] [birth year]   DATE OF PROCEDURE:  10/06/2013    RIGHT AND LEFT HEART CATHETERIZATION     HISTORY:  [name] is a 68 year old female who has a history of prior myocardial infarction and stenting of her right coronary artery. She states that she has 3 stents. She has a history of hypertension, atrial fibrillation, status post permanent pacemaker, and has obstructive sleep apnea. She was admitted from the office after she saw Dr. Acie Fredrickson on April 3 with congestive heart failure. An echo Doppler study showed an ejection fraction of 0000000 grade 2 diastolic dysfunction. She had mild MR, moderate TR, and severe pulmonary hypertension with estimated PA pressure of 70 mm. She has diuresed over 10 pounds since hospitalization. She now presents for right and left heart cardiac catheterization.  PROCEDURE:  The patient was brought to the second floor [facility] cath lab in the postabsorptive state. She was premedicated with Versed 2 mg and fentanyl 25 mcg. A right femoral vein and artery were punctured anteriorly and a 7 French venous sheath and 5 French arterial sheath were inserted without difficulty. Swan-Ganz catheterization was performed via the right femoral vein and pressures were obtained in the right atrium, right ventricle, pulmonary artery, pulmonary capillary wedge positions. O2 saturation was obtained the pulmonary artery. Thermodilution an assumed Fick cardiac output determinations were obtained. A 5 French pigtail catheter was advanced to the central aorta. O2 saturation was obtained. Simultaneous PA and AO pressures were recorded. The pigtail catheter was passed into the left ventricle. Simultaneous PCWP and LV pressures were recorded. Left ventriculography was performed with 25 cc of contrast at 12 cc per sec. A careful LV AO pullback was performed. Diagnostic  catheterization was done utilizing 5 French at this 4 left and right coronary catheters. Hemostasis was obtained by direct pressure. The patient tolerated the procedure well and returned to her room chest pain-free with stable hemodynamics.  HEMODYNAMICS:   RA: a 11 mean 8 RV: 57/10 PA: 57/18 Pc: 17 AO: 150/70 LV: 150/18   Oxygen saturation: PA: 61%                                 LV: 89%  Cardiac output: 5.8 (thermodilution); 7.0 (Fick) Cardiac index:    2.7                            3.2  ANGIOGRAPHY:  1. Left main: Angiographically normal and bifurcated into the LAD and left circumflex vessel. 2. LAD: Appeared angiographically normal. Between the first diagonal and second diagonal vessel there appeared to be a stent versus calcification. This was widely patent. There was a prominent proximal septal perforator artery which supplied collaterals to a small RV marginal branch of the RCA 3. Left circumflex: normal 4. Right coronary artery: Tandem stents were in place extending from the ostium the mid RCA. There was mild irregularity with narrowing of 10-20% within the stented segment. The small RV marginal branch branch which was collateralized from the septals arising from the LAD was not visualized antegrade. The distal RCA beyond the stented segment was free of disease and gave rise to a PDA and PLA vessel.   Left ventriculography revealed mild left ventricular hypertrophy. Ejection fraction estimate  was approximately 55%. There was a small area mid-distal inferior hypocontractility.   IMPRESSION:  Preserved global LV function the estimated ejection fraction of 55% and evidence for a small area of mild mid-distal inferior hypocontractility  No significant residual coronary obstructive disease with the appearance of a patent stent in the LAD, normal left circumflex artery, and patent tandem stance in the RCA extending from the ostium to the mid segment. There is faint  collateralization of a small RV marginal branch which is supplied via septal collaterals arising from the LAD.  Moderate pulmonary hypertension following recent aggressive diuresis with a PA systolic pressure of 57 mm.  Troy Sine, MD, [facility] 10/06/2013 8:55 AM

## 2013-10-06 NOTE — H&P (View-Only) (Signed)
    Subjective:  Feels '100% better." No shortness of breath at rest. No chest discomfort.  Objective:  Vital Signs in the last 24 hours: Temp:  [97.7 F (36.5 C)-98.7 F (37.1 C)] 97.7 F (36.5 C) (04/06 0507) Pulse Rate:  [66-72] 66 (04/06 0507) Resp:  [17-18] 18 (04/06 0507) BP: (128-132)/(51-68) 132/51 mmHg (04/06 0507) SpO2:  [94 %-96 %] 94 % (04/06 0507) Weight:  [239 lb (108.41 kg)] 239 lb (108.41 kg) (04/06 0507)  Intake/Output from previous day: 04/05 0701 - 04/06 0700 In: 960 [P.O.:960] Out: 6700 [Urine:6700]  Physical Exam: Pt is alert and oriented, pleasant obese woman in NAD HEENT: normal Neck: JVP - normal Lungs: Good air movement with end expiratory wheezing present CV: RRR without murmur or gallop Abd: soft, NT, Positive BS, no hepatomegaly Ext: Trace pretibial edema, distal pulses intact and equal Skin: warm/dry no rash   Lab Results:  Recent Labs  10/03/13 1545  WBC 11.8*  HGB 10.8*  PLT 325    Recent Labs  10/06/13 0115 10/06/13 0525  NA 136* 137  K 4.4 4.0  CL 94* 93*  CO2 26 28  GLUCOSE 173* 109*  BUN 23 22  CREATININE 0.85 0.86   No results found for this basename: TROPONINI, CK, MB,  in the last 72 hours  Cardiac Studies: 2-D echocardiogram: Study Conclusions  - Left ventricle: The cavity size was normal. Systolic function was normal. The estimated ejection fraction was in the range of 55% to 60%. Wall motion was normal; there were no regional wall motion abnormalities. Features are consistent with a pseudonormal left ventricular filling pattern, with concomitant abnormal relaxation and increased filling pressure (grade 2 diastolic dysfunction). - Mitral valve: Calcified annulus. Mild regurgitation. - Left atrium: The atrium was mildly dilated. - Tricuspid valve: Moderate regurgitation. - Pulmonary arteries: Systolic pressure was moderately to severely increased. PA peak pressure: 15mm Hg (S).  Tele: Personally  reviewed, normal sinus rhythm.  Assessment/Plan:  1. Acute on chronic diastolic heart failure. She's had a brisk diuresis during this hospitalization as she is net 11 L negative. Her weight is down 8 pounds. Renal function remains stable and within normal limits. She is awaiting right and left heart catheterization this morning. As long as her hemodynamics are within range, would anticipate changing her to oral diuretics today. Will aim for hospital discharge tomorrow pending results of her heart catheterization. She has known pulmonary hypertension but does not appear significantly volume overloaded on exam at this time. Her echocardiogram was reviewed and demonstrates normal LV systolic function.  2. Paroxysmal atrial fibrillation. Maintaining sinus rhythm on Tikosyn. Pradaxa has been on hold for cardiac catheterization.   3. CAD s/p PCI of the RCA. No recent anginal symptoms. Has had angina in the past associated with her CAD. For right and left heart catheterization today in the setting of congestive heart failure evaluation.  4. Hyperlipidemia. Well controlled on current medical program which was reviewed today.  Disposition: Pending right and left heart catheterization results. Would anticipate discharge tomorrow unless hemodynamics suggest continued volume overload/decompensated heart failure. Clinically she is improved.   Sherren Mocha, M.D. 10/06/2013, 7:16 AM

## 2013-10-06 NOTE — Care Management Note (Unsigned)
    Page 1 of 2   10/06/2013     12:01:53 PM   CARE MANAGEMENT NOTE 10/06/2013  Patient:  [name], [name]   Account Number:  [id number]  Date Initiated:  10/04/2013  Documentation initiated by:  [facility]  Subjective/Objective Assessment:   adm: chest tightness     Action/Plan:   discharge planning   Anticipated DC Date:  10/07/2013   Anticipated DC Plan:  [place]  CM consult      [facility] Choice  HOME HEALTH   Choice offered to / List presented to:  C-1 Patient        [place] arranged  HH-1 RN  [place].   Status of service:  In process, will continue to follow Medicare Important Message given?   (If response is "NO", the following Medicare IM given date fields will be blank) Date Medicare IM given:   Date Additional Medicare IM given:    Discharge Disposition:  [place]  Per UR Regulation:    If discussed at Long Length of Stay Meetings, dates discussed:    Comments:  10/06/13 Eyoel Throgmorton,RN,BSN [phone] MET WITH PT REGARDING [facility] UP, NOT REALIZING CASE MGMT HAD MET WITH PT OVER THE WEEKEND.  PT CHOSE [facility] FOR HH FOLLOW UP, AS SHE HAS HAD IN THE PAST.  SHE STATES SHE DID NOT CHOOSE [facility], AND WANTS TO USE [facility].  REFERRAL TO [facility] AS REQUESTED.  NOTIFIED [facility] OF CANCELLATION OF REFERRAL.  START OF CARE 24-48H POST DC DATE.  10/04/13 08:15 CM spoke with pt and pt's husband in room to offer choice.  [facility] chosen to render [facility] for disease management of CHF.  Referral called to [facility] of [place].  CM will continue to follow pt's progress for other needs.  Mariane Masters, BSN, CM [phone].

## 2013-10-07 DIAGNOSIS — I5033 Acute on chronic diastolic (congestive) heart failure: Secondary | ICD-10-CM | POA: Diagnosis present

## 2013-10-07 NOTE — Clinical Documentation Improvement (Signed)
Possible Clinical Conditions:   - Acute Respiratory Failure, resolved   - Other  Condition   - Unable to Clinically Determine   Supporting Information:  ABG 10/06/2013 at 0822  - Ph 7.42  - PcO2 - 42.6  - p02 - 56.0  - Bicarb - 27.7  - O2 Sat 89.0%  O2 Sat in the office at rest - 86%  Required O2 at 2 liters nasal cannula. Thank You, Erling Conte ,RN Clinical Documentation Specialist:  [place] Information Management

## 2013-10-07 NOTE — Discharge Instructions (Signed)
Heart Failure °Heart failure is a condition in which the heart has trouble pumping blood. This means your heart does not pump blood efficiently for your body to work well. In some cases of heart failure, fluid may back up into your lungs or you may have swelling (edema) in your lower legs. Heart failure is usually a long-term (chronic) condition. It is important for you to take good care of yourself and follow your caregiver's treatment plan. °CAUSES  °Some health conditions can cause heart failure. Those health conditions include: °· High blood pressure (hypertension) causes the heart muscle to work harder than normal. When pressure in the blood vessels is high, the heart needs to pump (contract) with more force in order to circulate blood throughout the body. High blood pressure eventually causes the heart to become stiff and weak. °· Coronary artery disease (CAD) is the buildup of cholesterol and fat (plaque) in the arteries of the heart. The blockage in the arteries deprives the heart muscle of oxygen and blood. This can cause chest pain and may lead to a heart attack. High blood pressure can also contribute to CAD. °· Heart attack (myocardial infarction) occurs when 1 or more arteries in the heart become blocked. The loss of oxygen damages the muscle tissue of the heart. When this happens, part of the heart muscle dies. The injured tissue does not contract as well and weakens the heart's ability to pump blood. °· Abnormal heart valves can cause heart failure when the heart valves do not open and close properly. This makes the heart muscle pump harder to keep the blood flowing. °· Heart muscle disease (cardiomyopathy or myocarditis) is damage to the heart muscle from a variety of causes. These can include drug or alcohol abuse, infections, or unknown reasons. These can increase the risk of heart failure. °· Lung disease makes the heart work harder because the lungs do not work properly. This can cause a strain  on the heart, leading it to fail. °· Diabetes increases the risk of heart failure. High blood sugar contributes to high fat (lipid) levels in the blood. Diabetes can also cause slow damage to tiny blood vessels that carry important nutrients to the heart muscle. When the heart does not get enough oxygen and food, it can cause the heart to become weak and stiff. This leads to a heart that does not contract efficiently. °· Other conditions can contribute to heart failure. These include abnormal heart rhythms, thyroid problems, and low blood counts (anemia). °Certain unhealthy behaviors can increase the risk of heart failure. Those unhealthy behaviors include: °· Being overweight. °· Smoking or chewing tobacco. °· Eating foods high in fat and cholesterol. °· Abusing illicit drugs or alcohol. °· Lacking physical activity. °SYMPTOMS  °Heart failure symptoms may vary and can be hard to detect. Symptoms may include: °· Shortness of breath with activity, such as climbing stairs. °· Persistent cough. °· Swelling of the feet, ankles, legs, or abdomen. °· Unexplained weight gain. °· Difficulty breathing when lying flat (orthopnea). °· Waking from sleep because of the need to sit up and get more air. °· Rapid heartbeat. °· Fatigue and loss of energy. °· Feeling lightheaded, dizzy, or close to fainting. °· Loss of appetite. °· Nausea. °· Increased urination during the night (nocturia). °DIAGNOSIS  °A diagnosis of heart failure is based on your history, symptoms, physical examination, and diagnostic tests. °Diagnostic tests for heart failure may include: °· Echocardiography. °· Electrocardiography. °· Chest X-ray. °· Blood tests. °· Exercise   stress test. °· Cardiac angiography. °· Radionuclide scans. °TREATMENT  °Treatment is aimed at managing the symptoms of heart failure. Medicines, behavioral changes, or surgical intervention may be necessary to treat heart failure. °· Medicines to help treat heart failure may  include: °· Angiotensin-converting enzyme (ACE) inhibitors. This type of medicine blocks the effects of a blood protein called angiotensin-converting enzyme. ACE inhibitors relax (dilate) the blood vessels and help lower blood pressure. °· Angiotensin receptor blockers. This type of medicine blocks the actions of a blood protein called angiotensin. Angiotensin receptor blockers dilate the blood vessels and help lower blood pressure. °· Water pills (diuretics). Diuretics cause the kidneys to remove salt and water from the blood. The extra fluid is removed through urination. This loss of extra fluid lowers the volume of blood the heart pumps. °· Beta blockers. These prevent the heart from beating too fast and improve heart muscle strength. °· Digitalis. This increases the force of the heartbeat. °· Healthy behavior changes include: °· Obtaining and maintaining a healthy weight. °· Stopping smoking or chewing tobacco. °· Eating heart healthy foods. °· Limiting or avoiding alcohol. °· Stopping illicit drug use. °· Physical activity as directed by your caregiver. °· Surgical treatment for heart failure may include: °· A procedure to open blocked arteries, repair damaged heart valves, or remove damaged heart muscle tissue. °· A pacemaker to improve heart muscle function and control certain abnormal heart rhythms. °· An internal cardioverter defibrillator to treat certain serious abnormal heart rhythms. °· A left ventricular assist device to assist the pumping ability of the heart. °HOME CARE INSTRUCTIONS  °· Take your medicine as directed by your caregiver. Medicines are important in reducing the workload of your heart, slowing the progression of heart failure, and improving your symptoms. °· Do not stop taking your medicine unless directed by your caregiver. °· Do not skip any dose of medicine. °· Refill your prescriptions before you run out of medicine. Your medicines are needed every day. °· Take over-the-counter  medicine only as directed by your caregiver or pharmacist. °· Engage in moderate physical activity if directed by your caregiver. Moderate physical activity can benefit some people. The elderly and people with severe heart failure should consult with a caregiver for physical activity recommendations. °· Eat heart healthy foods. Food choices should be free of trans fat and low in saturated fat, cholesterol, and salt (sodium). Healthy choices include fresh or frozen fruits and vegetables, fish, lean meats, legumes, fat-free or low-fat dairy products, and whole grain or high fiber foods. Talk to a dietitian to learn more about heart healthy foods. °· Limit sodium if directed by your caregiver. Sodium restriction may reduce symptoms of heart failure in some people. Talk to a dietitian to learn more about heart healthy seasonings. °· Use healthy cooking methods. Healthy cooking methods include roasting, grilling, broiling, baking, poaching, steaming, or stir-frying. Talk to a dietitian to learn more about healthy cooking methods. °· Limit fluids if directed by your caregiver. Fluid restriction may reduce symptoms of heart failure in some people. °· Weigh yourself every day. Daily weights are important in the early recognition of excess fluid. You should weigh yourself every morning after you urinate and before you eat breakfast. Wear the same amount of clothing each time you weigh yourself. Record your daily weight. Provide your caregiver with your weight record. °· Monitor and record your blood pressure if directed by your caregiver. °· Check your pulse if directed by your caregiver. °· Lose weight if directed   by your caregiver. Weight loss may reduce symptoms of heart failure in some people. °· Stop smoking or chewing tobacco. Nicotine makes your heart work harder by causing your blood vessels to constrict. Do not use nicotine gum or patches before talking to your caregiver. °· Schedule and attend follow-up visits as  directed by your caregiver. It is important to keep all your appointments. °· Limit alcohol intake to no more than 1 drink per day for nonpregnant women and 2 drinks per day for men. Drinking more than that is harmful to your heart. Tell your caregiver if you drink alcohol several times a week. Talk with your caregiver about whether alcohol is safe for you. If your heart has already been damaged by alcohol or you have severe heart failure, drinking alcohol should be stopped completely. °· Stop illicit drug use. °· Stay up-to-date with immunizations. It is especially important to prevent respiratory infections through current pneumococcal and influenza immunizations. °· Manage other health conditions such as hypertension, diabetes, thyroid disease, or abnormal heart rhythms as directed by your caregiver. °· Learn to manage stress. °· Plan rest periods when fatigued. °· Learn strategies to manage high temperatures. If the weather is extremely hot: °· Avoid vigorous physical activity. °· Use air conditioning or fans or seek a cooler location. °· Avoid caffeine and alcohol. °· Wear loose-fitting, lightweight, and light-colored clothing. °· Learn strategies to manage cold temperatures. If the weather is extremely cold: °· Avoid vigorous physical activity. °· Layer clothes. °· Wear mittens or gloves, a hat, and a scarf when going outside. °· Avoid alcohol. °· Obtain ongoing education and support as needed. °· Participate or seek rehabilitation as needed to maintain or improve independence and quality of life. °SEEK MEDICAL CARE IF:  °· Your weight increases by 03 lb/1.4 kg in 1 day or 05 lb/2.3 kg in a week. °· You have increasing shortness of breath that is unusual for you. °· You are unable to participate in your usual physical activities. °· You tire easily. °· You cough more than normal, especially with physical activity. °· You have any or more swelling in areas such as your hands, feet, ankles, or abdomen. °· You  are unable to sleep because it is hard to breathe. °· You feel like your heart is beating fast (palpitations). °· You become dizzy or lightheaded upon standing up. °SEEK IMMEDIATE MEDICAL CARE IF:  °· You have difficulty breathing. °· There is a change in mental status such as decreased alertness or difficulty with concentration. °· You have a pain or discomfort in your chest. °· You have an episode of fainting (syncope). °MAKE SURE YOU:  °· Understand these instructions. °· Will watch your condition. °· Will get help right away if you are not doing well or get worse. °Document Released: 06/19/2005 Document Revised: 10/14/2012 Document Reviewed: 07/11/2012 °[employer]® Patient Information ©2014 [employer]. ° °

## 2013-10-07 NOTE — Discharge Summary (Signed)
   Patient ID: [name],  MRN: [id number], DOB/AGE: 68/24/1947 67 y.o.  Admit date: 10/03/2013 Discharge date: 10/07/2013  Primary Care Provider: Dr W. [name] Primary Cardiologist: Dr P. Nahser  Discharge Diagnoses Principal Problem:   Acute diastolic heart failure Active Problems:   HYPERTENSION   CAD- previous LAD and RCA stents- patent at cath 10/06/13   Pulmonary hypertension   Chronic diastolic heart failure- grade 2 diastolic dysfunction by echo 10/03/13   HYPERLIPIDEMIA   OBESITY   OBSTRUCTIVE SLEEP APNEA   ATRIAL FIBRILLATION, PAROXYSMAL   BRADYCARDIA-TACHYCARDIA SYNDROME   PACEMAKER, PERMANENT   Anticoagulant long-term use    Procedures: R+L cath 10/06/13   Hospital Course:   The pt is a 65-year-old female with a history of atrial fibrillation- NSR on Tykosyn, SSS- s/p PTVDP, CAD- s/p LAD and RCA stenting in the past. She also has a history of hyperlipidemia, hypertension, obesity and sleep apnea. She uses a CPAP mask at night. She presented 10/03/13 with acute diastolic CHF. Her wgt was 247. She was admitted and diuresed. Echo 10/03/13 showed an EF 55-60% with grade 2 diastolic dysfunction and PA pressures of 70 mmHg. Her Pradaxa was held and she underwent Rt and Lt heart cath on 10/06/13. See Dr Kelly's note for complete details. She had a patent LAD stent and patent RCA stents. She had mild LVH with a PA pressure of 57 mmHg after she had been diuresed. On 10/07/13 she was feeling well. Her wgt is down to 239. Her medications are unchanged. She has an appointment with Dr Nahser 10/20/13 and will keep this. She has been instructed to call if her wgt goes up 3-4 lbs in a day. She knows to avoid salt   Discharge Vitals:  Blood pressure 139/67, pulse 62, temperature 98 F (36.7 C), temperature source Oral, resp. rate 20, height 5' 6.5" (1.689 m), weight 239 lb 11.2 oz (108.727 kg), SpO2 95.00%.    Labs: Results for orders placed during the hospital encounter of 10/03/13 (from the  past 48 hour(s))  BASIC METABOLIC PANEL     Status: Abnormal   Collection Time    10/06/13  1:15 AM      Result Value Ref Range   Sodium 136 (*) 137 - 147 mEq/L   Potassium 4.4  3.7 - 5.3 mEq/L   Chloride 94 (*) 96 - 112 mEq/L   CO2 26  19 - 32 mEq/L   Glucose, Bld 173 (*) 70 - 99 mg/dL   BUN 23  6 - 23 mg/dL   Creatinine, Ser 0.85  0.50 - 1.10 mg/dL   Calcium 9.1  8.4 - 10.5 mg/dL   GFR calc non Af Amer 69 (*) >90 mL/min   GFR calc Af Amer 80 (*) >90 mL/min   Comment: (NOTE)     The eGFR has been calculated using the CKD EPI equation.     This calculation has not been validated in all clinical situations.     eGFR's persistently <90 mL/min signify possible Chronic Kidney     Disease.  BASIC METABOLIC PANEL     Status: Abnormal   Collection Time    10/06/13  5:25 AM      Result Value Ref Range   Sodium 137  137 - 147 mEq/L   Potassium 4.0  3.7 - 5.3 mEq/L   Chloride 93 (*) 96 - 112 mEq/L   CO2 28  19 - 32 mEq/L   Glucose, Bld 109 (*) 70 - 99   mg/dL   BUN 22  6 - 23 mg/dL   Creatinine, Ser 0.86  0.50 - 1.10 mg/dL   Calcium 9.6  8.4 - 10.5 mg/dL   GFR calc non Af Amer 68 (*) >90 mL/min   GFR calc Af Amer 79 (*) >90 mL/min   Comment: (NOTE)     The eGFR has been calculated using the CKD EPI equation.     This calculation has not been validated in all clinical situations.     eGFR's persistently <90 mL/min signify possible Chronic Kidney     Disease.  POCT I-STAT 3, BLOOD GAS (G3P V)     Status: Abnormal   Collection Time    10/06/13  8:18 AM      Result Value Ref Range   pH, Ven 7.394 (*) 7.250 - 7.300   pCO2, Ven 47.3  45.0 - 50.0 mmHg   pO2, Ven 32.0  30.0 - 45.0 mmHg   Bicarbonate 29.0 (*) 20.0 - 24.0 mEq/L   TCO2 30  0 - 100 mmol/L   O2 Saturation 61.0     Acid-Base Excess 3.0 (*) 0.0 - 2.0 mmol/L   Sample type VENOUS     Comment NOTIFIED PHYSICIAN    POCT I-STAT 3, BLOOD GAS (G3+)     Status: Abnormal   Collection Time    10/06/13  8:22 AM      Result Value Ref  Range   pH, Arterial 7.421  7.350 - 7.450   pCO2 arterial 42.6  35.0 - 45.0 mmHg   pO2, Arterial 56.0 (*) 80.0 - 100.0 mmHg   Bicarbonate 27.7 (*) 20.0 - 24.0 mEq/L   TCO2 29  0 - 100 mmol/L   O2 Saturation 89.0     Acid-Base Excess 3.0 (*) 0.0 - 2.0 mmol/L   Sample type ARTERIAL    PROTIME-INR     Status: Abnormal   Collection Time    10/06/13 11:10 AM      Result Value Ref Range   Prothrombin Time 15.9 (*) 11.6 - 15.2 seconds   INR 1.30  0.00 - 1.49    Disposition:  Follow-up Information   Follow up with Darden Amber., MD On 10/20/2013. (9:45 am)    Specialty:  Cardiology   Contact information:   [address] [place] [address] [phone]       Discharge Medications:    Medication List         aspirin 81 MG tablet  Take 81 mg by mouth daily.     atorvastatin 40 MG tablet  Commonly known as:  LIPITOR  Take 40 mg by mouth every morning.     b complex vitamins tablet  Take 1 tablet by mouth daily.     dabigatran 150 MG Caps capsule  Commonly known as:  PRADAXA  Take 150 mg by mouth 2 (two) times daily.     diltiazem 180 MG 24 hr capsule  Commonly known as:  DILACOR XR  Take 1 capsule (180 mg total) by mouth daily.     dofetilide 250 MCG capsule  Commonly known as:  TIKOSYN  Take 1 capsule (250 mcg total) by mouth 2 (two) times daily.     FLUoxetine 20 MG capsule  Commonly known as:  PROZAC  Take 20 mg by mouth daily.     furosemide 40 MG tablet  Commonly known as:  LASIX  Take 1 tablet (40 mg total) by mouth daily.  ketoconazole 2 % cream  Commonly known as:  NIZORAL  Apply 1 application topically 2 (two) times daily.     losartan 100 MG tablet  Commonly known as:  COZAAR  Take 1 tablet (100 mg total) by mouth daily.     Lysine HCl 1000 MG Tabs  Take 1 tablet by mouth daily as needed (for fever blisters).     metoprolol succinate 100 MG 24 hr tablet  Commonly known as:  TOPROL-XL  - Take 50-100 mg by mouth 2 (two)  times daily. Take with or immediately following a meal.  - Takes 100mg in am, and 50mg in pm     multivitamin tablet  Take 1 tablet by mouth daily.     nitroGLYCERIN 0.4 MG SL tablet  Commonly known as:  NITROSTAT  Place 1 tablet (0.4 mg total) under the tongue every 5 (five) minutes as needed.     potassium chloride SA 20 MEQ tablet  Commonly known as:  K-DUR,KLOR-CON  Take 20 mEq by mouth every morning.     vitamin B-12 100 MCG tablet  Commonly known as:  CYANOCOBALAMIN  Take 50 mcg by mouth daily.     Vitamin D-3 5000 UNITS Tabs  Take 5,000 Units by mouth every morning.     vitamin E 1000 UNIT capsule  Take 1,000 Units by mouth daily.         Duration of Discharge Encounter: Greater than 30 minutes including physician time.  Signed, Luke Kilroy PA-C 10/07/2013 11:10 AM 

## 2013-10-07 NOTE — Progress Notes (Signed)
    Subjective:  SOB improved  Objective:  Vital Signs in the last 24 hours: Temp:  [98 F (36.7 C)-98.6 F (37 C)] 98 F (36.7 C) (04/07 0507) Pulse Rate:  [62-81] 62 (04/07 0507) Resp:  [18-20] 20 (04/07 0507) BP: (123-141)/(48-68) 139/67 mmHg (04/07 0507) SpO2:  [94 %-100 %] 95 % (04/07 0507) Weight:  [239 lb 11.2 oz (108.727 kg)] 239 lb 11.2 oz (108.727 kg) (04/07 0507)  Intake/Output from previous day:  Intake/Output Summary (Last 24 hours) at 10/07/13 1021 Last data filed at 10/07/13 0730  Gross per 24 hour  Intake    480 ml  Output   1150 ml  Net   -670 ml    Physical Exam: General appearance: alert, cooperative, no distress and moderately obese Lungs: clear to auscultation bilaterally Heart: regular rate and rhythm Extremities: Rt groin without hematoma   Rate: 62  Rhythm: normal sinus rhythm  Lab Results: No results found for this basename: WBC, HGB, PLT,  in the last 72 hours  Recent Labs  10/06/13 0115 10/06/13 0525  NA 136* 137  K 4.4 4.0  CL 94* 93*  CO2 26 28  GLUCOSE 173* 109*  BUN 23 22  CREATININE 0.85 0.86   No results found for this basename: TROPONINI, CK, MB,  in the last 72 hours  Recent Labs  10/06/13 1110  INR 1.30    Imaging: No results found.  Cardiac Studies:  Assessment/Plan:   Principal Problem:   Acute diastolic heart failure Active Problems:   HYPERTENSION   CAD- previous LAD and RCA stents- patent at cath 10/06/13   Pulmonary hypertension   Chronic diastolic heart failure- grade 2 diastolic dysfunction by echo 10/03/13   HYPERLIPIDEMIA   OBESITY   OBSTRUCTIVE SLEEP APNEA   ATRIAL FIBRILLATION, PAROXYSMAL   BRADYCARDIA-TACHYCARDIA SYNDROME   PACEMAKER, PERMANENT   Anticoagulant long-term use    PLAN: Resume Pradaxa. Should be OK to discharge, F/U with Dr Cathie Olden. She appears to be on the same medications she was on prior to admission.   Kerin Ransom PA-C Beeper [id number] 10/07/2013, 10:21 AM   Patient  seen, examined. Available data reviewed. Agree with findings, assessment, and plan as outlined by Kerin Ransom, PA-C. Exam reveals an alert, oriented woman in no distress. Lungs are clear. Heart is regular rate and rhythm without murmur or gallop. Jugular venous pressure is normal. There is no peripheral edema. Cardiac catheterization findings reviewed. Her intracardiac filling pressures were within expected limits with the exception of persistent pulmonary hypertension. I have reviewed her medication list and agree with continuing her current medicines at discharge. She will be on furosemide 40 mg daily. The patient does have a scale at home and she is compliant with daily weights. She understands the importance of sodium restriction. She understands to call the office if there is a 3 pound weight gain over 24 hours or a 5 pound weight gain over anytime period. Will arrange close outpatient follow-up.   Sherren Mocha, M.D. 10/07/2013 10:41 AM

## 2013-10-07 NOTE — Progress Notes (Signed)
Patient has home CPAP unit set up at bedside.  Patient is able to place herself on /off as needed.

## 2013-10-16 ENCOUNTER — Encounter: Payer: Self-pay | Admitting: Cardiovascular Disease

## 2013-10-20 ENCOUNTER — Encounter: Payer: Self-pay | Admitting: Cardiovascular Disease

## 2013-10-20 ENCOUNTER — Ambulatory Visit (INDEPENDENT_AMBULATORY_CARE_PROVIDER_SITE_OTHER): Payer: Medicare HMO | Admitting: Cardiovascular Disease

## 2013-10-20 VITALS — BP 136/80 | HR 60 | Ht 66.5 in | Wt 244.8 lb

## 2013-10-20 DIAGNOSIS — R1013 Epigastric pain: Secondary | ICD-10-CM

## 2013-10-20 DIAGNOSIS — R609 Edema, unspecified: Secondary | ICD-10-CM

## 2013-10-20 DIAGNOSIS — E785 Hyperlipidemia, unspecified: Secondary | ICD-10-CM

## 2013-10-20 DIAGNOSIS — K3189 Other diseases of stomach and duodenum: Secondary | ICD-10-CM

## 2013-10-20 DIAGNOSIS — I4891 Unspecified atrial fibrillation: Secondary | ICD-10-CM

## 2013-10-20 MED ORDER — POTASSIUM CHLORIDE CRYS ER 20 MEQ PO TBCR: 20.0000 meq | EXTENDED_RELEASE_TABLET | Freq: Two times a day (BID) | ORAL | Status: DC

## 2013-10-20 MED ORDER — METOPROLOL SUCCINATE ER 100 MG PO TB24: 50.0000 mg | ORAL_TABLET | Freq: Two times a day (BID) | ORAL | Status: DC

## 2013-10-20 MED ORDER — METOPROLOL SUCCINATE ER 100 MG PO TB24: ORAL_TABLET | ORAL | Status: DC

## 2013-10-20 MED ORDER — FUROSEMIDE 40 MG PO TABS: 40.0000 mg | ORAL_TABLET | Freq: Two times a day (BID) | ORAL | Status: DC

## 2013-10-20 NOTE — Assessment & Plan Note (Signed)
She has moderate pulmonary hypertension by cardiac cath. Continue with her current medications.

## 2013-10-20 NOTE — Assessment & Plan Note (Signed)
Ms. Soisson has HFpEF.  Her symptoms are much better after diuresing and hospital. She was discharged on the same Lasix dose that she was admitted with. We'll increase her Lasix to 40 mg twice a day and we'll also increase her potassium chloride to 20 mEq twice a day. She is careful about eating any extra salt in her diet.  We'll see her back in one month for a basic metabolic profile and magnesium level. Magnesium were drawn because she is on tikosyn . I'll see her for an office visit in 3 months. We'll draw fasting labs including basic metabolic profile, magnesium level, liver enzymes, and lipid profile at that time.

## 2013-10-20 NOTE — Patient Instructions (Signed)
Your physician has recommended you make the following change in your medication:  INCREASE Lasix to 40 mg twice daily INCREASE KDur to 20 mEq twice daily  Your physician recommends that you return for lab work in: 1 month (BMET, Magnesium)  Your physician wants you to follow-up in: 3 months with fasting labs. You will receive a reminder letter in the mail two months in advance. If you don't receive a letter, please call our office to schedule the follow-up appointment.  Your physician recommends that you return for a FASTING lipid profile: in 3 months when you see Dr. Acie Fredrickson

## 2013-10-20 NOTE — Progress Notes (Signed)
[name] Date of Birth  1946/06/10 [facility] [address]    [place], [place]  [address] [phone]  Fax  [phone]   Problem list: 1. Atrial fibrillation 2. Status post pacemaker implantation 3. Coronary artery disease-status post PTCA and stenting of her right coronary artery 4. Hyperlipidemia 5. Hypertension 6. Obstructive sleep apnea-currently on CPAP  History of Present Illness:  [name] is a 68 year old female with a history of atrial fibrillation. She status post pacemaker implantation. She also has a history of coronary artery disease and is status post PTCA and stenting of the right coronary artery. She also has a history of hyperlipidemia, hypertension, and sleep apnea.  She uses a CPAP mask at night.  The CPAP mask has really helped her stay in normal sinus rhythm.  She complains of having some episodes of chest tightness. This occurs at times. One episode was relieved with sublingual nitroglycerin. She states that it is not nearly as severe as her previous episodes of angina.  January 13, 21014: She was hospitalized in November with severe shortness breath and "double pneumonia". She is slowly recovering.  She's looking forward to  getting back into her exercise program.  She has not been exercising at all.  She's had some leg edema.  She denies any chest pain or shortness breath.  She's has mild leg swelling for the past several months. She does not add salt any of her food. She tries to stay away from salty foods and processed meat.  September 18, 2013:  She remains short of breath  - especialy with exertion  Echo in Jan. 2014:   - Left ventricle: The cavity size was normal. Wall thickness was increased in a pattern of mild LVH. Systolic function was normal. The estimated ejection fraction was in the range of 60% to 65%. - Pulmonary arteries: PA peak pressure: 42mm Hg   October 03, 2013:  Level was seen about 2 weeks ago with some increasing  shortness of breath. She was noted to have moderate pulmonary hypertension on echo. She started on diltiazem. She has fairly well preserved left ventricle systolic function.  We started her on Cardizem XL 180 mg a day. She has not done well since that medication in addition. She's more short of breath. She's gained about 5 pounds.  She presents today for her echocardiogram. She is very short of breath and was worked into the schedule. Her O2 saturations at rest were 86%. We placed  oxygen on her and her O2 saturations increased above 90%. She is clearly more short of breath after the addition of the diltiazem.   She has not been eating any extra salt .  Has been taking her lasix.   She denies any chest pain - specifically was asked about pleuretic CP.  Had orthopnea until we placed the oxygen on her.   10/20/2013: [name] was admitted to the hospital with severe shortness breath when I saw her several weeks ago. She was found to be volume overload. She was aggressively diuresed and lost about 10 pounds of weight. Cardiac catheterization following that diuresis revealed only minimal coronary artery irregularities. Her left ventricular systolic function was normal. Her PA pressures were moderately elevated with an estimated PA pressure of around 57.   She was discharged on the same dose of Lasix that she was on at admission.   She is avoiding salt.    Current Outpatient Prescriptions on File Prior to Visit  Medication Sig Dispense  Refill  . aspirin 81 MG tablet Take 81 mg by mouth daily.       [name] atorvastatin (LIPITOR) 40 MG tablet Take 40 mg by mouth every morning.       [name] b complex vitamins tablet Take 1 tablet by mouth daily.      . Cholecalciferol (VITAMIN D-3) 5000 UNITS TABS Take 5,000 Units by mouth every morning.      . dabigatran (PRADAXA) 150 MG CAPS capsule Take 150 mg by mouth 2 (two) times daily.      [name] diltiazem (DILACOR XR) 180 MG 24 hr capsule Take 1 capsule (180 mg total) by mouth  daily.  90 capsule  3  . dofetilide (TIKOSYN) 250 MCG capsule Take 1 capsule (250 mcg total) by mouth 2 (two) times daily.  180 capsule  3  . FLUoxetine (PROZAC) 20 MG capsule Take 20 mg by mouth daily.      . furosemide (LASIX) 40 MG tablet Take 1 tablet (40 mg total) by mouth daily.  90 tablet  3  . ketoconazole (NIZORAL) 2 % cream Apply 1 application topically 2 (two) times daily.      [name] losartan (COZAAR) 100 MG tablet Take 1 tablet (100 mg total) by mouth daily.  90 tablet  3  . Lysine HCl 1000 MG TABS Take 1 tablet by mouth daily as needed (for fever blisters).       . metoprolol succinate (TOPROL-XL) 100 MG 24 hr tablet Take 50-100 mg by mouth 2 (two) times daily. Take with or immediately following a meal. Takes 100mg  in am, and 50mg  in pm      . Multiple Vitamin (MULTIVITAMIN) tablet Take 1 tablet by mouth daily.        . nitroGLYCERIN (NITROSTAT) 0.4 MG SL tablet Place 1 tablet (0.4 mg total) under the tongue every 5 (five) minutes as needed.  25 tablet  5  . potassium chloride SA (K-DUR,KLOR-CON) 20 MEQ tablet Take 20 mEq by mouth every morning.      . vitamin B-12 (CYANOCOBALAMIN) 100 MCG tablet Take 50 mcg by mouth daily.      . vitamin E 1000 UNIT capsule Take 1,000 Units by mouth daily.       No current facility-administered medications on file prior to visit.    No Known Allergies  Past Medical History  Diagnosis Date  . History of CVA (cerebrovascular accident)   . CAD (coronary artery disease) [id number]    post PTCA with bare-metal stenting to mid RCA in December 2004     . Hypertension   . Chronic atrial fibrillation 06/2007    Tachybradycardia pacemaker  . Pacemaker   . OSA on CPAP   . Anticoagulant long-term use     pradaxa  . Transfusion history   . Hyperlipidemia   . Edema of lower extremity   . History of acute inferior wall MI   . Anxiety   . Diplopia 06/19/2008    Qualifier: Diagnosis of  By: [name] MD, [name]   . Unspecified hemorrhoids without mention  of complication [id number]    Colonoscopy--Dr. Carlean Purl   . Myocardial infarction   . Dysrhythmia     ATRIAL FIBRILATION  . TIA (transient ischemic attack)   . CHF (congestive heart failure)   . Shortness of breath     Past Surgical History  Procedure Laterality Date  . Total abdominal hysterectomy  1984    2 surgeries endometriosis bso  . Permanent pacemaker  06/2007  .  Back surgeries      x2-3 by Dr Trenton Gammon  . Coronary stent placement      C stent  . Cholecystectomy  1984  . Coronary angioplasty with stent placement  1998  . Doppler echocardiography  2009  . Back surgery      History  Smoking status  . Former Smoker -- 1.00 packs/day for 5 years  . Types: Cigarettes  . Start date: 05/11/1978  . Quit date: 07/03/1982  Smokeless tobacco  . Never Used    History  Alcohol Use  . Yes    Comment: once q 6 months-socailly-wine    Family History  Problem Relation Age of Onset  . Suicidality Father     suicide death pt was 3 yrs  . Arrhythmia Mother     heart arrthymia  . Hypertension Mother   . Heart attack Brother     died age 76  . Heart disease Paternal Aunt   . Prostate cancer Maternal Grandfather   . Diabetes Paternal Grandfather     fathers side of the family  . Diabetes Mother     Reviw of Systems:  Reviewed in the HPI.  All other systems are negative.  Physical Exam: BP 136/80  Pulse 60  Ht 5' 6.5" (1.689 m)  Wt 244 lb 12.8 oz (111.041 kg)  BMI 38.92 kg/m2 The patient is alert and oriented x 3.     HEENT:   She has normal carotids.  n oJVD,   Her mucous membranes are moist.  Lungs: Lung exam is clear.   Heart: Regular rate S1-S2.  No S3 heard  Abdomen: Her abdomen is soft. She has good bowel sounds.  Extremities:  She has 1+-2+  ankle edema.  Neuro:  Her gait is normal. Her neuro exam is nonfocal.    ECG: September 18, 2013:  NSR , NS ST abnormalities.   Assessment / Plan:

## 2013-11-18 ENCOUNTER — Encounter: Payer: Self-pay | Admitting: Internal Medicine

## 2013-11-18 DIAGNOSIS — I495 Sick sinus syndrome: Secondary | ICD-10-CM

## 2013-11-19 ENCOUNTER — Other Ambulatory Visit (INDEPENDENT_AMBULATORY_CARE_PROVIDER_SITE_OTHER): Payer: Medicare HMO

## 2013-11-19 DIAGNOSIS — R609 Edema, unspecified: Secondary | ICD-10-CM

## 2013-11-19 DIAGNOSIS — E785 Hyperlipidemia, unspecified: Secondary | ICD-10-CM

## 2013-11-19 DIAGNOSIS — I4891 Unspecified atrial fibrillation: Secondary | ICD-10-CM

## 2013-11-19 DIAGNOSIS — K3189 Other diseases of stomach and duodenum: Secondary | ICD-10-CM

## 2013-11-19 DIAGNOSIS — R1013 Epigastric pain: Secondary | ICD-10-CM

## 2013-11-19 LAB — MAGNESIUM: Magnesium: 2 mg/dL (ref 1.5–2.5)

## 2013-11-19 LAB — BASIC METABOLIC PANEL
BUN: 18 mg/dL (ref 6–23)
CO2: 31 mEq/L (ref 19–32)
Calcium: 9.3 mg/dL (ref 8.4–10.5)
Chloride: 97 mEq/L (ref 96–112)
Creatinine, Ser: 1 mg/dL (ref 0.4–1.2)
GFR: 57.97 mL/min — ABNORMAL LOW (ref >60.00)
Glucose, Bld: 112 mg/dL — ABNORMAL HIGH (ref 70–99)
Potassium: 4.4 mEq/L (ref 3.5–5.1)
Sodium: 135 mEq/L (ref 135–145)

## 2013-11-19 LAB — HEPATIC FUNCTION PANEL
ALT: 28 U/L (ref 0–35)
AST: 24 U/L (ref 0–37)
Albumin: 3.8 g/dL (ref 3.5–5.2)
Alkaline Phosphatase: 97 U/L (ref 39–117)
Bilirubin, Direct: 0 mg/dL (ref 0.0–0.3)
Total Bilirubin: 0.4 mg/dL (ref 0.2–1.2)
Total Protein: 7.3 g/dL (ref 6.0–8.3)

## 2013-11-19 LAB — LIPID PANEL
Cholesterol: 136 mg/dL (ref 0–200)
HDL: 47.5 mg/dL (ref >39.00)
LDL Cholesterol: 65 mg/dL (ref 0–99)
Total CHOL/HDL Ratio: 3
Triglycerides: 117 mg/dL (ref 0.0–149.0)
VLDL: 23.4 mg/dL (ref 0.0–40.0)

## 2013-12-03 ENCOUNTER — Other Ambulatory Visit: Payer: Self-pay | Admitting: Cardiovascular Disease

## 2013-12-17 ENCOUNTER — Other Ambulatory Visit: Payer: Self-pay | Admitting: Internal Medicine

## 2013-12-18 NOTE — Telephone Encounter (Signed)
Sent to the pharmacy by e-scribe. 

## 2013-12-19 ENCOUNTER — Encounter: Payer: Self-pay | Admitting: Physician Assistant

## 2013-12-19 ENCOUNTER — Ambulatory Visit (INDEPENDENT_AMBULATORY_CARE_PROVIDER_SITE_OTHER): Payer: Medicare HMO | Admitting: Physician Assistant

## 2013-12-19 VITALS — BP 110/80 | HR 66 | Temp 98.3°F | Resp 18 | Wt 247.0 lb

## 2013-12-19 DIAGNOSIS — H6122 Impacted cerumen, left ear: Secondary | ICD-10-CM

## 2013-12-19 DIAGNOSIS — H6120 Impacted cerumen, unspecified ear: Secondary | ICD-10-CM

## 2013-12-19 MED ORDER — NEOMYCIN-POLYMYXIN-HC 1 % OT SOLN: 3.0000 [drp] | Freq: Four times a day (QID) | OTIC | Status: DC

## 2013-12-19 NOTE — Patient Instructions (Addendum)
Cortisporin ear drops, 3 drops in left ear 4 times daily for 1 week to prevent infection. Call PCP on Monday to let them know if you are experiencing any worsening symptoms or decreased hearing. If this happens, we will refer you for evaluation at ENT.  Over the counter wax softening drops are available incase you feel your ear becoming full this way in the future.  If emergency symptoms discussed during visit developed, seek medical attention immediately.  Followup as needed, or for worsening or persistent symptoms despite treatment.   Cerumen Plug A cerumen plug is having too much wax in your ear canal. The outer ear canal is lined with hairs and glands that secrete wax. This wax is called cerumen. This protects the ear canal. It also helps prevent material from entering the ear. Too much wax can cause a feeling of fullness in the ears, decreased hearing, ringing in the ears, or an earache. Sometimes your caregiver will remove a cerumen plug with an instrument called a curette. Or he/she may flush the ear canal with warm water from a syringe to remove the wax. You may simply be sent home to follow the home care instructions below for wax removal. Generally ear wax does not have to be removed unless it is causing a problem such as one of those listed above. When too much wax is causing a problem, the following are a few home remedies which can be used to help this problem. HOME CARE INSTRUCTIONS   Put a couple drops of glycerin, baby oil, or mineral oil in the ear a couple times of day. Do this every day for several days. After putting the drops in, you will need to lay with the affected ear pointing up for a couple minutes. This allows the drops to remain in the canal and run down to the area of wax blockage. This will soften the wax plug. It may also make your hearing worse as the wax softens and blocks the canal even more.  After a couple days, you may gently flush the ear canal with warm water  from a syringe. Do this by pulling your ear up and back with your head tilted slightly forward and towards a pan to catch the water. This is most easily done with a helper. You can also accomplish the same thing by letting the shower beat into your ear canal to wash the wax out. Sometimes this will not be immediately successful. You will have to return to the first step of using the oil to further soften the wax. Then resume washing the ear canal out with a syringe or shower.  Following removal of the wax, put ten to twenty drops of rubbing alcohol into the outer ears. This will dry the canal and prevent an infection.  Do not irrigate or wash out your ears if you have had a perforated ear drum or mastoid surgery. SEEK IMMEDIATE MEDICAL CARE IF:   You are unsuccessful with the above instructions for home care.  You develop ear pain or drainage from the ear. MAKE SURE YOU:   Understand these instructions.  Will watch your condition.  Will get help right away if you are not doing well or get worse. Document Released: 03/14/2001 Document Revised: 09/11/2011 Document Reviewed: 06/10/2008 [facility] Patient Information 2015 [place], [place]. This information is not intended to replace advice given to you by your health care provider. Make sure you discuss any questions you have with your health care provider.

## 2013-12-19 NOTE — Progress Notes (Signed)
Subjective:    Patient ID: [name], female    DOB: March 03, 1946, 68 y.o.   MRN: [id number]  Ear Fullness  There is pain in the left ear. This is a new problem. The current episode started in the past 7 days (3 days ago). The problem occurs constantly. The problem has been unchanged. There has been no fever. The pain is at a severity of 0/10. The patient is experiencing no pain. Associated symptoms include hearing loss. Pertinent negatives include no abdominal pain, coughing, diarrhea, drainage, ear discharge, headaches, neck pain, rash, rhinorrhea, sore throat or vomiting. She has tried ear drops (ear drops for swimmers ear from pharmacy, also peroxide.) for the symptoms. The treatment provided no relief.      Review of Systems  Constitutional: Negative for fever and chills.  HENT: Positive for hearing loss. Negative for ear discharge, rhinorrhea and sore throat.   Respiratory: Negative for cough and shortness of breath.   Cardiovascular: Negative for chest pain.  Gastrointestinal: Negative for nausea, vomiting, abdominal pain and diarrhea.  Musculoskeletal: Negative for neck pain.  Skin: Negative for rash.  Neurological: Negative for headaches.  All other systems reviewed and are negative.     Past Medical History  Diagnosis Date  . History of CVA (cerebrovascular accident)   . CAD (coronary artery disease) [id number]    post PTCA with bare-metal stenting to mid RCA in December 2004     . Hypertension   . Chronic atrial fibrillation 06/2007    Tachybradycardia pacemaker  . Pacemaker   . OSA on CPAP   . Anticoagulant long-term use     pradaxa  . Transfusion history   . Hyperlipidemia   . Edema of lower extremity   . History of acute inferior wall MI   . Anxiety   . Diplopia 06/19/2008    Qualifier: Diagnosis of  By: [name] MD, [name]   . Unspecified hemorrhoids without mention of complication [id number]    Colonoscopy--Dr. Carlean Purl   . Myocardial infarction   .  Dysrhythmia     ATRIAL FIBRILATION  . TIA (transient ischemic attack)   . CHF (congestive heart failure)   . Shortness of breath     History   Social History  . Marital Status: Married    Spouse Name: N/A    Number of Children: 0  . Years of Education: HS   Occupational History  . retired     previously worked [employer]  . Smoking status: Former Smoker -- 1.00 packs/day for 5 years    Types: Cigarettes    Start date: 05/11/1978    Quit date: 07/03/1982  . Smokeless tobacco: Never Used  . Alcohol Use: Yes     Comment: once q 6 months-socailly-wine  . Drug Use: No  . Sexual Activity: Not on file   Other Topics Concern  . 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   Married    Originally from [place] of two, high school education   Former smoker [phone] 1ppd   Hunting dogs 7    Retired from [employer]     Past Surgical History  Procedure Laterality Date  . Total abdominal hysterectomy  1984    2 surgeries endometriosis bso  . Permanent pacemaker  06/2007  . Back surgeries  x2-3 by Dr Trenton Gammon  . Coronary stent placement      C stent  . Cholecystectomy  1984  . Coronary angioplasty with stent placement  1998  . Doppler echocardiography  2009  . Back surgery      Family History  Problem Relation Age of Onset  . Suicidality Father     suicide death pt was 3 yrs  . Arrhythmia Mother     heart arrthymia  . Hypertension Mother   . Heart attack Brother     died age 29  . Heart disease Paternal Aunt   . Prostate cancer Maternal Grandfather   . Diabetes Paternal Grandfather     fathers side of the family  . Diabetes Mother     No Known Allergies  Current Outpatient Prescriptions on File Prior to Visit  Medication Sig Dispense Refill  . aspirin 81 MG tablet Take 81 mg by mouth daily.       [name] atorvastatin (LIPITOR) 40  MG tablet Take 40 mg by mouth every morning.       [name] b complex vitamins tablet Take 1 tablet by mouth daily.      . Cholecalciferol (VITAMIN D-3) 5000 UNITS TABS Take 5,000 Units by mouth every morning.      . dabigatran (PRADAXA) 150 MG CAPS capsule Take 150 mg by mouth 2 (two) times daily.      [name] diltiazem (DILACOR XR) 180 MG 24 hr capsule Take 1 capsule (180 mg total) by mouth daily.  90 capsule  3  . dofetilide (TIKOSYN) 250 MCG capsule Take 1 capsule (250 mcg total) by mouth 2 (two) times daily.  180 capsule  3  . FLUoxetine (PROZAC) 20 MG capsule Take 20 mg by mouth daily.      [name] FLUoxetine (PROZAC) 20 MG capsule TAKE ONE CAPSULE BY MOUTH IN THE MORNING  90 capsule  2  . furosemide (LASIX) 40 MG tablet Take 1 tablet (40 mg total) by mouth 2 (two) times daily.  180 tablet  3  . ketoconazole (NIZORAL) 2 % cream Apply 1 application topically 2 (two) times daily.      [name] losartan (COZAAR) 100 MG tablet Take 1 tablet (100 mg total) by mouth daily.  90 tablet  3  . Lysine HCl 1000 MG TABS Take 1 tablet by mouth daily as needed (for fever blisters).       . metoprolol succinate (TOPROL-XL) 100 MG 24 hr tablet Take with or immediately following a meal. Take 100mg  (1 tablet) in am, and 50mg  (1/2 tablet) in pm  150 tablet  3  . Multiple Vitamin (MULTIVITAMIN) tablet Take 1 tablet by mouth daily.        . nitroGLYCERIN (NITROSTAT) 0.4 MG SL tablet Place 1 tablet (0.4 mg total) under the tongue every 5 (five) minutes as needed.  25 tablet  5  . potassium chloride SA (K-DUR,KLOR-CON) 20 MEQ tablet Take 1 tablet (20 mEq total) by mouth 2 (two) times daily.  180 tablet  3  . vitamin B-12 (CYANOCOBALAMIN) 100 MCG tablet Take 50 mcg by mouth daily.      . vitamin E 1000 UNIT capsule Take 1,000 Units by mouth daily.       No current facility-administered medications on file prior to visit.    EXAM: BP 110/80  Pulse 66  Temp(Src) 98.3 F (36.8 C) (Oral)  Resp 18  Wt 247 lb (112.038 kg)       Objective:   Physical Exam  Nursing note and vitals reviewed. Constitutional: She is oriented to person, place, and time. She appears well-developed and well-nourished. No distress.  HENT:  Head: Normocephalic and atraumatic.  Right Ear: External ear normal.  Nose: Nose normal.  Left External ear cerumen impaction. Bilateral TMs normal, after lavage. Some bleeding noted under stuck on cerumen.   Eyes: Conjunctivae and EOM are normal. Pupils are equal, round, and reactive to light.  Neck: Normal range of motion.  Cardiovascular: Normal rate, regular rhythm, normal heart sounds and intact distal pulses.   Pulmonary/Chest: Effort normal and breath sounds normal. No respiratory distress. She exhibits no tenderness.  Musculoskeletal: Normal range of motion.  Neurological: She is alert and oriented to person, place, and time.  Skin: Skin is warm and dry. No rash noted. She is not diaphoretic. No erythema. No pallor.  Psychiatric: She has a normal mood and affect. Her behavior is normal. Judgment and thought content normal.    Lab Results  Component Value Date   WBC 11.8* 10/03/2013   HGB 10.8* 10/03/2013   HCT 33.2* 10/03/2013   PLT 325 10/03/2013   GLUCOSE 112* 11/19/2013   CHOL 136 11/19/2013   TRIG 117.0 11/19/2013   HDL 47.50 11/19/2013   LDLCALC 65 11/19/2013   ALT 28 11/19/2013   AST 24 11/19/2013   NA 135 11/19/2013   K 4.4 11/19/2013   CL 97 11/19/2013   CREATININE 1.0 11/19/2013   BUN 18 11/19/2013   CO2 31 11/19/2013   TSH 3.380 10/03/2013   INR 1.30 10/06/2013   HGBA1C 6.6* 09/09/2013         Assessment & Plan:  Aftyn was seen today for ears clogged.  Diagnoses and associated orders for this visit:  Cerumen impaction, left Comments: ear lavage. Hearing returned. Some bleeding under stuck cerumen noted>cortisporin drops. Recomend against q-tip use, and for OTC wax softener if this recurrs.  - Ear Lavage - NEOMYCIN-POLYMYXIN-HYDROCORTISONE (CORTISPORIN) 1 % SOLN otic solution;  Place 3 drops into the left ear 4 (four) times daily.    Pt noted to have small amount of bleeding under a stuck piece of cerumen in her left external ear. No active bleeding. No rupture to TM. Will prescribe cortisporin drops to prevent infection. Will have pt call office on Monday to let PCP know if symptoms are worsened, if bleeding from ear, or if hearing is decreased. Will determine need for referral at that time.  Return precautions provided, and patient handout on cerumen plug.  Plan to follow up as needed, or for worsening or persistent symptoms despite treatment.  Patient Instructions  Cortisporin ear drops, 3 drops in left ear 4 times daily for 1 week to prevent infection. Call PCP on Monday to let them know if you are experiencing any worsening symptoms or decreased hearing. If this happens, we will refer you for evaluation at ENT.  Over the counter wax softening drops are available incase you feel your ear becoming full this way in the future.  If emergency symptoms discussed during visit developed, seek medical attention immediately.  Followup as needed, or for worsening or persistent symptoms despite treatment.

## 2013-12-19 NOTE — Progress Notes (Signed)
Pre visit review using our clinic review tool, if applicable. No additional management support is needed unless otherwise documented below in the visit note. 

## 2014-01-15 ENCOUNTER — Other Ambulatory Visit (INDEPENDENT_AMBULATORY_CARE_PROVIDER_SITE_OTHER): Payer: Medicare HMO

## 2014-01-15 DIAGNOSIS — D649 Anemia, unspecified: Secondary | ICD-10-CM

## 2014-01-15 DIAGNOSIS — R7309 Other abnormal glucose: Secondary | ICD-10-CM

## 2014-01-15 LAB — CBC WITH DIFFERENTIAL/PLATELET
Basophils Absolute: 0 10*3/uL (ref 0.0–0.1)
Basophils Relative: 0.5 % (ref 0.0–3.0)
Eosinophils Absolute: 0.6 10*3/uL (ref 0.0–0.7)
Eosinophils Relative: 6 % — ABNORMAL HIGH (ref 0.0–5.0)
HCT: 42.3 % (ref 36.0–46.0)
Hemoglobin: 13.8 g/dL (ref 12.0–15.0)
Lymphocytes Relative: 26.3 % (ref 12.0–46.0)
Lymphs Abs: 2.5 10*3/uL (ref 0.7–4.0)
MCHC: 32.8 g/dL (ref 30.0–36.0)
MCV: 91.3 fl (ref 78.0–100.0)
Monocytes Absolute: 0.7 10*3/uL (ref 0.1–1.0)
Monocytes Relative: 7.4 % (ref 3.0–12.0)
Neutro Abs: 5.6 10*3/uL (ref 1.4–7.7)
Neutrophils Relative %: 59.8 % (ref 43.0–77.0)
Platelets: 296 10*3/uL (ref 150.0–400.0)
RBC: 4.63 Mil/uL (ref 3.87–5.11)
RDW: 15 % (ref 11.5–15.5)
WBC: 9.4 10*3/uL (ref 4.0–10.5)

## 2014-01-15 LAB — IBC PANEL
Iron: 72 ug/dL (ref 42–145)
Saturation Ratios: 17.4 % — ABNORMAL LOW (ref 20.0–50.0)
Transferrin: 296.2 mg/dL (ref 212.0–360.0)

## 2014-01-15 LAB — HEMOGLOBIN A1C: Hgb A1c MFr Bld: 6.9 % — ABNORMAL HIGH (ref 4.6–6.5)

## 2014-01-20 ENCOUNTER — Ambulatory Visit (INDEPENDENT_AMBULATORY_CARE_PROVIDER_SITE_OTHER): Payer: Medicare HMO | Admitting: Internal Medicine

## 2014-01-20 ENCOUNTER — Encounter: Payer: Self-pay | Admitting: Internal Medicine

## 2014-01-20 VITALS — BP 148/82 | Temp 98.7°F | Ht 66.0 in | Wt 246.0 lb

## 2014-01-20 DIAGNOSIS — R7309 Other abnormal glucose: Secondary | ICD-10-CM

## 2014-01-20 DIAGNOSIS — D509 Iron deficiency anemia, unspecified: Secondary | ICD-10-CM

## 2014-01-20 DIAGNOSIS — I2510 Atherosclerotic heart disease of native coronary artery without angina pectoris: Secondary | ICD-10-CM

## 2014-01-20 DIAGNOSIS — R7303 Prediabetes: Secondary | ICD-10-CM

## 2014-01-20 NOTE — Progress Notes (Signed)
Chief Complaint  Patient presents with  . Follow-up    HPI: [name] With hx sig cv hx  Has pulm ht AF and cad pacer and stnet who had hosp in April of this year for acute fluid overload acut hf  Her meds were adjustted by cardiology . Since that time  She has been seen for cerumen impaction and fu today for her hyperglycemia pre diabetic bg   Feels pretty good . Following weight for fluid retention  To call if 3 #/ day or 5 # in a week. Trying deep better as good for her heart and her blood sugar and to lose weight in an appropriate manner Still taking some iron no active bleeding. ROS: See pertinent positives and negatives per HPI.  Past Medical History  Diagnosis Date  . History of CVA (cerebrovascular accident)   . CAD (coronary artery disease) [id number]    post PTCA with bare-metal stenting to mid RCA in December 2004     . Hypertension   . Chronic atrial fibrillation 06/2007    Tachybradycardia pacemaker  . Pacemaker   . OSA on CPAP   . Anticoagulant long-term use     pradaxa  . Transfusion history   . Hyperlipidemia   . Edema of lower extremity   . History of acute inferior wall MI   . Anxiety   . Diplopia 06/19/2008    Qualifier: Diagnosis of  By: [name] MD, [name]   . Unspecified hemorrhoids without mention of complication [id number]    Colonoscopy--Dr. Carlean Purl   . Myocardial infarction   . Dysrhythmia     ATRIAL FIBRILATION  . TIA (transient ischemic attack)   . CHF (congestive heart failure)   . Shortness of breath     Family History  Problem Relation Age of Onset  . Suicidality Father     suicide death pt was 3 yrs  . Arrhythmia Mother     heart arrthymia  . Hypertension Mother   . Heart attack Brother     died age 52  . Heart disease Paternal Aunt   . Prostate cancer Maternal Grandfather   . Diabetes Paternal Grandfather     fathers side of the family  . Diabetes Mother     History   Social History  . Marital Status: Married    Spouse  Name: N/A    Number of Children: 0  . Years of Education: HS   Occupational History  . retired     previously worked [employer]  . Smoking status: Former Smoker -- 1.00 packs/day for 5 years    Types: Cigarettes    Start date: 05/11/1978    Quit date: 07/03/1982  . Smokeless tobacco: Never Used  . Alcohol Use: Yes     Comment: once q 6 months-socailly-wine  . Drug Use: No  . Sexual Activity: None   Other Topics Concern  . 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   Married    Originally from [place] of two, high school education   Former smoker [phone] 1ppd   Hunting dogs 7    Retired from [employer] 2     Outpatient Encounter Prescriptions as of 01/20/2014  Medication Sig  . aspirin 81 MG tablet Take 81 mg by mouth daily.   [name] atorvastatin (LIPITOR) 40 MG tablet  Take 40 mg by mouth every morning.   [name] b complex vitamins tablet Take 1 tablet by mouth daily.  . Cholecalciferol (VITAMIN D-3) 5000 UNITS TABS Take 5,000 Units by mouth every morning.  . dabigatran (PRADAXA) 150 MG CAPS capsule Take 150 mg by mouth 2 (two) times daily.  [name] diltiazem (DILACOR XR) 180 MG 24 hr capsule Take 1 capsule (180 mg total) by mouth daily.  [name] dofetilide (TIKOSYN) 250 MCG capsule Take 1 capsule (250 mcg total) by mouth 2 (two) times daily.  [name] FLUoxetine (PROZAC) 20 MG capsule TAKE ONE CAPSULE BY MOUTH IN THE MORNING  . furosemide (LASIX) 40 MG tablet Take 1 tablet (40 mg total) by mouth 2 (two) times daily.  [name] ketoconazole (NIZORAL) 2 % cream Apply 1 application topically 2 (two) times daily.  [name] losartan (COZAAR) 100 MG tablet Take 1 tablet (100 mg total) by mouth daily.  [name] Lysine HCl 1000 MG TABS Take 1 tablet by mouth daily as needed (for fever blisters).   . metoprolol succinate (TOPROL-XL) 100 MG 24 hr tablet Take with or immediately following a meal. Take 100mg  (1  tablet) in am, and 50mg  (1/2 tablet) in pm  . Multiple Vitamin (MULTIVITAMIN) tablet Take 1 tablet by mouth daily.    . NEOMYCIN-POLYMYXIN-HYDROCORTISONE (CORTISPORIN) 1 % SOLN otic solution Place 3 drops into the left ear 4 (four) times daily.  . nitroGLYCERIN (NITROSTAT) 0.4 MG SL tablet Place 1 tablet (0.4 mg total) under the tongue every 5 (five) minutes as needed.  . potassium chloride SA (K-DUR,KLOR-CON) 20 MEQ tablet Take 1 tablet (20 mEq total) by mouth 2 (two) times daily.  . vitamin B-12 (CYANOCOBALAMIN) 1000 MCG tablet Take 1,000 mcg by mouth daily.  . vitamin E 1000 UNIT capsule Take 1,000 Units by mouth daily.  . [DISCONTINUED] vitamin B-12 (CYANOCOBALAMIN) 100 MCG tablet Take 50 mcg by mouth daily.  . [DISCONTINUED] FLUoxetine (PROZAC) 20 MG capsule Take 20 mg by mouth daily.    EXAM:  BP 148/82  Temp(Src) 98.7 F (37.1 C) (Oral)  Ht 5\' 6"  (1.676 m)  Wt 246 lb (111.585 kg)  BMI 39.72 kg/m2  Body mass index is 39.72 kg/(m^2). BP Readings from Last 3 Encounters:  01/20/14 148/82  12/19/13 110/80  10/20/13 136/80   Wt Readings from Last 3 Encounters:  01/20/14 246 lb (111.585 kg)  12/19/13 247 lb (112.038 kg)  10/20/13 244 lb 12.8 oz (111.041 kg)    GENERAL: vitals reviewed and listed above, delightful lady who appears well alert, oriented, appears well hydrated and in no acute distress HEENT: atraumatic, conjunctiva  clear, no obvious abnormalities on inspection of external nose and ears  NECK: no obvious masses on inspection palpation  LUNGS: clear to auscultation bilaterally, no wheezes, rales or rhonchi,CV: HRRR, no clubbing cyanosis or  peripheral edema nl cap refill  MS: moves all extremities without noticeable focal  abnormality PSYCH: pleasant and cooperative, no obvious depression or anxiety Lab Results  Component Value Date   WBC 9.4 01/15/2014   HGB 13.8 01/15/2014   HCT 42.3 01/15/2014   PLT 296.0 01/15/2014   GLUCOSE 112* 11/19/2013   CHOL 136 11/19/2013    TRIG 117.0 11/19/2013   HDL 47.50 11/19/2013   LDLCALC 65 11/19/2013   ALT 28 11/19/2013   AST 24 11/19/2013   NA 135 11/19/2013   K 4.4 11/19/2013   CL 97 11/19/2013   CREATININE 1.0 11/19/2013   BUN 18 11/19/2013   CO2 31 11/19/2013  TSH 3.380 10/03/2013   INR 1.30 10/06/2013   HGBA1C 6.9* 01/15/2014    ASSESSMENT AND PLAN:  Discussed the following assessment and plan:  Pre-diabetes  Other abnormal glucose - a1c in early diabetic range  Fbg in  pre diabetic range   Coronary atherosclerosis of unspecified type of vessel, native or graft  Iron deficiency anemia - better  ibc still a bit low  . but no longer anemic Numbers are actually consistent with early diabetes although CBGs or not. Ins and outs of medication discussed she will continue with intensification of lifestyle counseling done today and close followup. Is probably not a candidate for metformin at this time because of her history of acute heart failure in Apr however these things may change and stabilize. She can stop the iron supplementation in a couple months as her anemia and iron deficiency or have significantly improved. il.-Patient advised to return or notify health care team  if symptoms worsen ,persist or new concerns arise.  Patient Instructions  Trend away from cold cereal for  Breakfast.  Eggs  not fried or creamed .  Limit bread and white potatoes . Rice.  Track  The next 2-3 weeks  To see how to change.  Sugars levels would be cw early diabetes. Metformin is a great med but usually not advised in heart failure.  Can stop iron after another 2 months or so   Anemia is better.    [name]. [name] M.D.   Pre visit review using our clinic review tool, if applicable. No additional management support is needed unless otherwise documented below in the visit note. Total visit 24mins > 50% spent counseling and coordinating care

## 2014-01-20 NOTE — Patient Instructions (Signed)
Trend away from cold cereal for  Breakfast.  Eggs  not fried or creamed .  Limit bread and white potatoes . Rice.  Track  The next 2-3 weeks  To see how to change.  Sugars levels would be cw early diabetes. Metformin is a great med but usually not advised in heart failure.  Can stop iron after another 2 months or so   Anemia is better.

## 2014-02-24 ENCOUNTER — Encounter: Payer: Self-pay | Admitting: *Deleted

## 2014-03-21 ENCOUNTER — Inpatient Hospital Stay (HOSPITAL_COMMUNITY)
Admission: EM | Admit: 2014-03-21 | Discharge: 2014-03-24 | DRG: 293 | Disposition: A | Discharge status: Home / Self Care | Payer: Medicare HMO | Attending: Internal Medicine | Admitting: Internal Medicine

## 2014-03-21 ENCOUNTER — Emergency Department (HOSPITAL_COMMUNITY): Payer: Medicare HMO

## 2014-03-21 ENCOUNTER — Encounter (HOSPITAL_COMMUNITY): Payer: Self-pay | Admitting: Emergency Medicine

## 2014-03-21 DIAGNOSIS — R0602 Shortness of breath: Secondary | ICD-10-CM | POA: Diagnosis not present

## 2014-03-21 DIAGNOSIS — F411 Generalized anxiety disorder: Secondary | ICD-10-CM | POA: Diagnosis present

## 2014-03-21 DIAGNOSIS — I509 Heart failure, unspecified: Secondary | ICD-10-CM | POA: Diagnosis present

## 2014-03-21 DIAGNOSIS — Z79899 Other long term (current) drug therapy: Secondary | ICD-10-CM

## 2014-03-21 DIAGNOSIS — I2510 Atherosclerotic heart disease of native coronary artery without angina pectoris: Secondary | ICD-10-CM

## 2014-03-21 DIAGNOSIS — E785 Hyperlipidemia, unspecified: Secondary | ICD-10-CM | POA: Diagnosis present

## 2014-03-21 DIAGNOSIS — Z7982 Long term (current) use of aspirin: Secondary | ICD-10-CM

## 2014-03-21 DIAGNOSIS — Z950 Presence of cardiac pacemaker: Secondary | ICD-10-CM

## 2014-03-21 DIAGNOSIS — I5033 Acute on chronic diastolic (congestive) heart failure: Secondary | ICD-10-CM | POA: Diagnosis present

## 2014-03-21 DIAGNOSIS — I10 Essential (primary) hypertension: Secondary | ICD-10-CM

## 2014-03-21 DIAGNOSIS — Z7901 Long term (current) use of anticoagulants: Secondary | ICD-10-CM

## 2014-03-21 DIAGNOSIS — G4733 Obstructive sleep apnea (adult) (pediatric): Secondary | ICD-10-CM | POA: Diagnosis present

## 2014-03-21 DIAGNOSIS — Z8673 Personal history of transient ischemic attack (TIA), and cerebral infarction without residual deficits: Secondary | ICD-10-CM | POA: Diagnosis not present

## 2014-03-21 DIAGNOSIS — I4891 Unspecified atrial fibrillation: Secondary | ICD-10-CM | POA: Diagnosis present

## 2014-03-21 DIAGNOSIS — J40 Bronchitis, not specified as acute or chronic: Secondary | ICD-10-CM

## 2014-03-21 DIAGNOSIS — I252 Old myocardial infarction: Secondary | ICD-10-CM

## 2014-03-21 DIAGNOSIS — I480 Paroxysmal atrial fibrillation: Secondary | ICD-10-CM

## 2014-03-21 DIAGNOSIS — Z9861 Coronary angioplasty status: Secondary | ICD-10-CM

## 2014-03-21 DIAGNOSIS — I2789 Other specified pulmonary heart diseases: Secondary | ICD-10-CM | POA: Diagnosis present

## 2014-03-21 DIAGNOSIS — Z87891 Personal history of nicotine dependence: Secondary | ICD-10-CM

## 2014-03-21 DIAGNOSIS — E669 Obesity, unspecified: Secondary | ICD-10-CM

## 2014-03-21 DIAGNOSIS — I5032 Chronic diastolic (congestive) heart failure: Secondary | ICD-10-CM

## 2014-03-21 DIAGNOSIS — I5031 Acute diastolic (congestive) heart failure: Secondary | ICD-10-CM

## 2014-03-21 LAB — CBC WITH DIFFERENTIAL/PLATELET
Basophils Absolute: 0.1 10*3/uL (ref 0.0–0.1)
Basophils Relative: 1 % (ref 0–1)
Eosinophils Absolute: 0.7 10*3/uL (ref 0.0–0.7)
Eosinophils Relative: 7 % — ABNORMAL HIGH (ref 0–5)
HCT: 39 % (ref 36.0–46.0)
Hemoglobin: 12.8 g/dL (ref 12.0–15.0)
Lymphocytes Relative: 15 % (ref 12–46)
Lymphs Abs: 1.5 10*3/uL (ref 0.7–4.0)
MCH: 30.4 pg (ref 26.0–34.0)
MCHC: 32.8 g/dL (ref 30.0–36.0)
MCV: 92.6 fL (ref 78.0–100.0)
Monocytes Absolute: 1.1 10*3/uL — ABNORMAL HIGH (ref 0.1–1.0)
Monocytes Relative: 11 % (ref 3–12)
Neutro Abs: 6.8 10*3/uL (ref 1.7–7.7)
Neutrophils Relative %: 66 % (ref 43–77)
Platelets: 287 10*3/uL (ref 150–400)
RBC: 4.21 MIL/uL (ref 3.87–5.11)
RDW: 14.1 % (ref 11.5–15.5)
WBC: 10.2 10*3/uL (ref 4.0–10.5)

## 2014-03-21 LAB — COMPREHENSIVE METABOLIC PANEL
ALT: 43 U/L — ABNORMAL HIGH (ref 0–35)
AST: 30 U/L (ref 0–37)
Albumin: 3.6 g/dL (ref 3.5–5.2)
Alkaline Phosphatase: 151 U/L — ABNORMAL HIGH (ref 39–117)
Anion gap: 14 (ref 5–15)
BUN: 20 mg/dL (ref 6–23)
CO2: 28 mEq/L (ref 19–32)
Calcium: 9.4 mg/dL (ref 8.4–10.5)
Chloride: 96 mEq/L (ref 96–112)
Creatinine, Ser: 0.91 mg/dL (ref 0.50–1.10)
GFR calc Af Amer: 73 mL/min — ABNORMAL LOW (ref >90)
GFR calc non Af Amer: 63 mL/min — ABNORMAL LOW (ref >90)
Glucose, Bld: 119 mg/dL — ABNORMAL HIGH (ref 70–99)
Potassium: 3.8 mEq/L (ref 3.7–5.3)
Sodium: 138 mEq/L (ref 137–147)
Total Bilirubin: 0.4 mg/dL (ref 0.3–1.2)
Total Protein: 7.5 g/dL (ref 6.0–8.3)

## 2014-03-21 LAB — TROPONIN I: Troponin I: <0.3 ng/mL (ref <0.30)

## 2014-03-21 LAB — PRO B NATRIURETIC PEPTIDE: Pro B Natriuretic peptide (BNP): 1006 pg/mL — ABNORMAL HIGH (ref 0–125)

## 2014-03-21 MED ORDER — DOFETILIDE 250 MCG PO CAPS
250.0000 ug | ORAL_CAPSULE | Freq: Two times a day (BID) | ORAL | Status: DC
  Administered 2014-03-22 – 2014-03-24 (×6): 250 ug via ORAL
  Filled 2014-03-21 (×7): qty 1

## 2014-03-21 MED ORDER — LOSARTAN POTASSIUM 50 MG PO TABS
100.0000 mg | ORAL_TABLET | Freq: Every day | ORAL | Status: DC
  Administered 2014-03-22 – 2014-03-24 (×3): 100 mg via ORAL
  Filled 2014-03-21 (×3): qty 2

## 2014-03-21 MED ORDER — ACETAMINOPHEN 650 MG RE SUPP: 650.0000 mg | Freq: Four times a day (QID) | RECTAL | Status: DC | PRN

## 2014-03-21 MED ORDER — FUROSEMIDE 10 MG/ML IJ SOLN
80.0000 mg | Freq: Two times a day (BID) | INTRAMUSCULAR | Status: DC
  Administered 2014-03-22 – 2014-03-24 (×5): 80 mg via INTRAVENOUS
  Filled 2014-03-21 (×7): qty 8

## 2014-03-21 MED ORDER — FUROSEMIDE 10 MG/ML IJ SOLN
80.0000 mg | Freq: Once | INTRAMUSCULAR | Status: AC
  Administered 2014-03-21: 80 mg via INTRAVENOUS
  Filled 2014-03-21: qty 8

## 2014-03-21 MED ORDER — ACETAMINOPHEN 325 MG PO TABS: 650.0000 mg | ORAL_TABLET | Freq: Four times a day (QID) | ORAL | Status: DC | PRN

## 2014-03-21 MED ORDER — NITROGLYCERIN 0.4 MG SL SUBL: 0.4000 mg | SUBLINGUAL_TABLET | SUBLINGUAL | Status: DC | PRN

## 2014-03-21 MED ORDER — SODIUM CHLORIDE 0.9 % IJ SOLN
3.0000 mL | Freq: Two times a day (BID) | INTRAMUSCULAR | Status: DC
  Administered 2014-03-22 – 2014-03-23 (×2): 3 mL via INTRAVENOUS

## 2014-03-21 MED ORDER — ASPIRIN EC 81 MG PO TBEC
81.0000 mg | DELAYED_RELEASE_TABLET | Freq: Every day | ORAL | Status: DC
  Administered 2014-03-22 – 2014-03-24 (×3): 81 mg via ORAL
  Filled 2014-03-21 (×3): qty 1

## 2014-03-21 MED ORDER — DABIGATRAN ETEXILATE MESYLATE 150 MG PO CAPS
150.0000 mg | ORAL_CAPSULE | Freq: Two times a day (BID) | ORAL | Status: DC
  Administered 2014-03-22 – 2014-03-24 (×6): 150 mg via ORAL
  Filled 2014-03-21 (×7): qty 1

## 2014-03-21 MED ORDER — ONDANSETRON HCL 4 MG PO TABS: 4.0000 mg | ORAL_TABLET | Freq: Four times a day (QID) | ORAL | Status: DC | PRN

## 2014-03-21 MED ORDER — FLUOXETINE HCL 20 MG PO CAPS
20.0000 mg | ORAL_CAPSULE | Freq: Every day | ORAL | Status: DC
  Administered 2014-03-22 – 2014-03-24 (×3): 20 mg via ORAL
  Filled 2014-03-21 (×3): qty 1

## 2014-03-21 MED ORDER — POTASSIUM CHLORIDE CRYS ER 20 MEQ PO TBCR
20.0000 meq | EXTENDED_RELEASE_TABLET | Freq: Two times a day (BID) | ORAL | Status: DC
  Administered 2014-03-22 – 2014-03-24 (×6): 20 meq via ORAL
  Filled 2014-03-21 (×7): qty 1

## 2014-03-21 MED ORDER — DILTIAZEM HCL ER 180 MG PO CP24
180.0000 mg | ORAL_CAPSULE | Freq: Every day | ORAL | Status: DC
  Administered 2014-03-22 – 2014-03-24 (×3): 180 mg via ORAL
  Filled 2014-03-21 (×3): qty 1

## 2014-03-21 MED ORDER — SODIUM CHLORIDE 0.9 % IJ SOLN
3.0000 mL | Freq: Two times a day (BID) | INTRAMUSCULAR | Status: DC
  Administered 2014-03-22 – 2014-03-23 (×4): 3 mL via INTRAVENOUS

## 2014-03-21 MED ORDER — ONDANSETRON HCL 4 MG/2ML IJ SOLN: 4.0000 mg | Freq: Four times a day (QID) | INTRAMUSCULAR | Status: DC | PRN

## 2014-03-21 MED ORDER — VITAMIN E 45 MG (100 UNIT) PO CAPS
1000.0000 [IU] | ORAL_CAPSULE | Freq: Every day | ORAL | Status: DC
  Administered 2014-03-22 – 2014-03-24 (×3): 1000 [IU] via ORAL
  Filled 2014-03-21 (×3): qty 2

## 2014-03-21 MED ORDER — ATORVASTATIN CALCIUM 40 MG PO TABS
40.0000 mg | ORAL_TABLET | Freq: Every morning | ORAL | Status: DC
  Administered 2014-03-22 – 2014-03-24 (×3): 40 mg via ORAL
  Filled 2014-03-21 (×3): qty 1

## 2014-03-21 MED ORDER — METOPROLOL SUCCINATE ER 100 MG PO TB24
100.0000 mg | ORAL_TABLET | Freq: Every day | ORAL | Status: DC
  Administered 2014-03-22 – 2014-03-24 (×3): 100 mg via ORAL
  Filled 2014-03-21 (×3): qty 1

## 2014-03-21 NOTE — ED Notes (Addendum)
Pt. reports progressing SOB with productive cough and chest congestion onset Thursday this week , states history of CHF/ coronary stent , her cardiologist is Dr. Cathie Olden / Dr. Lovena Le. Denies fever or chills. O2 sat = 88% room air at triage. O2 2 lpm/[place] started at triage .

## 2014-03-21 NOTE — ED Notes (Signed)
Attempted report 

## 2014-03-21 NOTE — H&P (Signed)
[facility] History and Physical  [name] [id number] DOB: 06/04/1946 DOA: 03/21/2014  Referring physician: ER physician. PCP: Lottie Dawson, MD   Chief Complaint: Shortness of breath.  HPI: [name] is a 68 y.o. female history of CHF last EF measured in April 2015 was 55-60% with grade 2 diastolic dysfunction, CAD status post in the last cardiac catheter in April this year showed patent stents, pulmonary hypertension, OSA presents to the ER because of increasing shortness of breath over last 2 days. Patient states she has been having short of breath on exertion and on lying flat. She has gone recently for vacation with her family and patient states she was not able to be compliant with her diet. Patient states she had put on 5 pounds weight from her baseline. In the ER chest associated patient with mildly elevated pro BNP. Patient's visit consistent with CHF and was given Lasix 80 mg IV and admitted for further management. Patient denies any chest pain, fever chills has been having some productive cough. On exam patient does have bilateral lower extremity edema with elevated JVD. Patient has basal crepitations.   Review of Systems: As presented in the history of presenting illness, rest negative.  Past Medical History  Diagnosis Date  . History of CVA (cerebrovascular accident)   . CAD (coronary artery disease) [id number]    post PTCA with bare-metal stenting to mid RCA in December 2004     . Hypertension   . Chronic atrial fibrillation 06/2007    Tachybradycardia pacemaker  . Pacemaker   . OSA on CPAP   . Anticoagulant long-term use     pradaxa  . Transfusion history   . Hyperlipidemia   . Edema of lower extremity   . History of acute inferior wall MI   . Anxiety   . Diplopia 06/19/2008    Qualifier: Diagnosis of  By: [name] MD, [name]   . Unspecified hemorrhoids without mention of complication [id number]    Colonoscopy--Dr. Carlean Purl   . Myocardial infarction    . Dysrhythmia     ATRIAL FIBRILATION  . TIA (transient ischemic attack)   . CHF (congestive heart failure)   . Shortness of breath    Past Surgical History  Procedure Laterality Date  . Total abdominal hysterectomy  1984    2 surgeries endometriosis bso  . Permanent pacemaker  06/2007  . Back surgeries      x2-3 by Dr Trenton Gammon  . Coronary stent placement      C stent  . Cholecystectomy  1984  . Coronary angioplasty with stent placement  1998  . Doppler echocardiography  2009  . Back surgery     Social History:  reports that she quit smoking about 31 years ago. Her smoking use included Cigarettes. She started smoking about 35 years ago. She has a 5 pack-year smoking history. She has never used smokeless tobacco. She reports that she drinks alcohol. She reports that she does not use illicit drugs. Where does patient live home. Can patient participate in ADLs? Yes.  No Known Allergies  Family History:  Family History  Problem Relation Age of Onset  . Suicidality Father     suicide death pt was 3 yrs  . Arrhythmia Mother     heart arrthymia  . Hypertension Mother   . Heart attack Brother     died age 67  . Heart disease Paternal Aunt   . Prostate cancer Maternal Grandfather   . Diabetes Paternal Grandfather  fathers side of the family  . Diabetes Mother       Prior to Admission medications   Medication Sig Start Date End Date Taking? Authorizing Provider  aspirin 81 MG tablet Take 81 mg by mouth daily.  09/18/13  Yes Thayer Headings, MD  atorvastatin (LIPITOR) 40 MG tablet Take 40 mg by mouth every morning.    Yes Historical Provider, MD  b complex vitamins tablet Take 1 tablet by mouth daily.   Yes Historical Provider, MD  Cholecalciferol (VITAMIN D-3) 5000 UNITS TABS Take 5,000 Units by mouth every morning.   Yes Historical Provider, MD  dabigatran (PRADAXA) 150 MG CAPS capsule Take 150 mg by mouth 2 (two) times daily.   Yes Historical Provider, MD  diltiazem  (DILACOR XR) 180 MG 24 hr capsule Take 1 capsule (180 mg total) by mouth daily. 09/18/13  Yes Thayer Headings, MD  dofetilide (TIKOSYN) 250 MCG capsule Take 1 capsule (250 mcg total) by mouth 2 (two) times daily. 09/18/13  Yes Thayer Headings, MD  FLUoxetine (PROZAC) 20 MG capsule TAKE ONE CAPSULE BY MOUTH IN THE MORNING   Yes [name], MD  furosemide (LASIX) 40 MG tablet Take 1 tablet (40 mg total) by mouth 2 (two) times daily. 10/20/13  Yes Thayer Headings, MD  losartan (COZAAR) 100 MG tablet Take 1 tablet (100 mg total) by mouth daily. 09/18/13  Yes Thayer Headings, MD  Lysine HCl 1000 MG TABS Take 1 tablet by mouth daily as needed (for fever blisters).    Yes Historical Provider, MD  metoprolol succinate (TOPROL-XL) 100 MG 24 hr tablet Take with or immediately following a meal. Take 100mg  (1 tablet) in am, and 50mg  (1/2 tablet) in pm 10/20/13  Yes Thayer Headings, MD  Multiple Vitamin (MULTIVITAMIN) tablet Take 1 tablet by mouth daily.     Yes Historical Provider, MD  potassium chloride SA (K-DUR,KLOR-CON) 20 MEQ tablet Take 1 tablet (20 mEq total) by mouth 2 (two) times daily. 10/20/13  Yes Thayer Headings, MD  vitamin B-12 (CYANOCOBALAMIN) 1000 MCG tablet Take 1,000 mcg by mouth daily.   Yes Historical Provider, MD  vitamin E 1000 UNIT capsule Take 1,000 Units by mouth daily.   Yes Historical Provider, MD  nitroGLYCERIN (NITROSTAT) 0.4 MG SL tablet Place 1 tablet (0.4 mg total) under the tongue every 5 (five) minutes as needed. 09/18/13   Thayer Headings, MD    Physical Exam: Filed Vitals:   03/21/14 2115 03/21/14 2130 03/21/14 2145 03/21/14 2200  BP: 146/60 126/44 136/58 130/50  Pulse: 76 65 64 64  Temp:      TempSrc:      Resp: 24 29 29 25   Height:      Weight:      SpO2: 98% 95% 94% 95%     General:  Obese not in distress.  Eyes: Anicteric no pallor.  ENT: No discharge from ears eyes nose mouth.  Neck: Mildly elevated JVD.  Cardiovascular: S1-S2 heard.  Respiratory:  Basal crepitations no rhonchi.  Abdomen: Soft nontender bowel sounds present.  Skin: No rash.  Musculoskeletal: Bilateral lower extremity edema.  Psychiatric: Appears normal.  Neurologic: Alert and oriented to time place and person. Moves all extremities.  Labs on Admission:  Basic Metabolic Panel:  Recent Labs Lab 03/21/14 1943  NA 138  K 3.8  CL 96  CO2 28  GLUCOSE 119*  BUN 20  CREATININE 0.91  CALCIUM 9.4   Liver Function Tests:  Recent Labs Lab 03/21/14 1943  AST 30  ALT 43*  ALKPHOS 151*  BILITOT 0.4  PROT 7.5  ALBUMIN 3.6   No results found for this basename: LIPASE, AMYLASE,  in the last 168 hours No results found for this basename: AMMONIA,  in the last 168 hours CBC:  Recent Labs Lab 03/21/14 1943  WBC 10.2  NEUTROABS 6.8  HGB 12.8  HCT 39.0  MCV 92.6  PLT 287   Cardiac Enzymes:  Recent Labs Lab 03/21/14 1945  TROPONINI <0.30    BNP (last 3 results)  Recent Labs  10/03/13 1545 03/21/14 1945  PROBNP 1220.0* 1006.0*   CBG: No results found for this basename: GLUCAP,  in the last 168 hours  Radiological Exams on Admission: Dg Chest 2 View  03/21/2014   CLINICAL DATA:  Short of breath and productive cough  EXAM: CHEST  2 VIEW  COMPARISON:  09/24/2012  FINDINGS: Left-sided pacemaker overlies normal cardiac silhouette. Prominent pulmonary arteries similar prior. There is increased central venous pulmonary congestion. No pleural fluid. No pneumothorax.  IMPRESSION: Increased central venous pulmonary congestion.  Prominent pulmonary arteries.   Electronically Signed   By: Suzy Bouchard M.D.   On: 03/21/2014 21:02    EKG: Independently reviewed. Normal sinus rhythm.  Assessment/Plan Principal Problem:   Acute exacerbation of CHF (congestive heart failure) Active Problems:   OBSTRUCTIVE SLEEP APNEA   HYPERTENSION   CAD- previous LAD and RCA stents- patent at cath 10/06/13   ATRIAL FIBRILLATION, PAROXYSMAL   CHF (congestive heart  failure)   #1. Decompensated diastolic CHF last EF measured was 55-60% - patient has been placed on Lasix 80 mg IV every 12 hourly closely follow intake output and metabolic panel and daily weights. #2. Issue of CAD status post stenting - denies any chest pain. Patient is on anticoagulants and aspirin. #3. History of tachybradycardia syndrome status post pacemaker placement - on anticoagulants which will be continued. Continue antiarrhythmics. #4. History of hyperglycemia - closely follow metabolic panel. #5. History of OSA and pulmonary hypertension - on diuretics and CPAP. #6. Hypertension - continue present medications.    Code Status: Full code.  Family Communication: Patient's family at the bedside.  Disposition Plan: Admit to inpatient.    Selena Swaminathan N. [facility] Pager [phone].  If 7PM-7AM, please contact night-coverage [email or web address] Password [email or web address] 03/21/2014, 10:59 PM

## 2014-03-21 NOTE — ED Provider Notes (Signed)
CSN: [id number]     Arrival date & time 03/21/14  1918 History   First MD Initiated Contact with Patient 03/21/14 2012     Chief Complaint  Patient presents with  . Shortness of Breath  . Congestive Heart Failure   Patient is a 69 y.o. female presenting with shortness of breath and CHF. The history is provided by the patient.  Shortness of Breath Severity:  Moderate Onset quality:  Gradual Duration:  36 hours Timing:  Constant Progression:  Unchanged Chronicity:  Recurrent (feels similar to prior PNA) Context: URI   Associated symptoms: chest pain (only with cough, no pleurisy), cough, fever (100 F at home), sputum production (green) and wheezing   Associated symptoms: no abdominal pain, no hemoptysis, no rash and no vomiting   Risk factors: no hx of PE/DVT and no prolonged immobilization   Congestive Heart Failure Associated symptoms include chest pain (only with cough, no pleurisy), coughing and a fever (100 F at home). Pertinent negatives include no abdominal pain, rash or vomiting.   Preceded by URI sx with congestion, sore throat.  Compliant with Lasix 40 bid.  DOE more in past 2-3 days.  Orthopnea is a new symptom.  Diet was worse at a camp this past weekend. 3lb weight gain.  Takes Pradaxa for a fib.  CAD with BMS taking ASA  Past Medical History  Diagnosis Date  . History of CVA (cerebrovascular accident)   . CAD (coronary artery disease) [id number]    post PTCA with bare-metal stenting to mid RCA in December 2004     . Hypertension   . Chronic atrial fibrillation 06/2007    Tachybradycardia pacemaker  . Pacemaker   . OSA on CPAP   . Anticoagulant long-term use     pradaxa  . Transfusion history   . Hyperlipidemia   . Edema of lower extremity   . History of acute inferior wall MI   . Anxiety   . Diplopia 06/19/2008    Qualifier: Diagnosis of  By: [name] MD, [name]   . Unspecified hemorrhoids without mention of complication [id number]    Colonoscopy--Dr. Carlean Purl    . Myocardial infarction   . Dysrhythmia     ATRIAL FIBRILATION  . TIA (transient ischemic attack)   . CHF (congestive heart failure)   . Shortness of breath    Past Surgical History  Procedure Laterality Date  . Total abdominal hysterectomy  1984    2 surgeries endometriosis bso  . Permanent pacemaker  06/2007  . Back surgeries      x2-3 by Dr Trenton Gammon  . Coronary stent placement      C stent  . Cholecystectomy  1984  . Coronary angioplasty with stent placement  1998  . Doppler echocardiography  2009  . Back surgery     Family History  Problem Relation Age of Onset  . Suicidality Father     suicide death pt was 3 yrs  . Arrhythmia Mother     heart arrthymia  . Hypertension Mother   . Heart attack Brother     died age 43  . Heart disease Paternal Aunt   . Prostate cancer Maternal Grandfather   . Diabetes Paternal Grandfather     fathers side of the family  . Diabetes Mother    History  Substance Use Topics  . Smoking status: Former Smoker -- 1.00 packs/day for 5 years    Types: Cigarettes    Start date: 05/11/1978    Quit date:  07/03/1982  . Smokeless tobacco: Never Used  . Alcohol Use: Yes     Comment: once q 6 months-socailly-wine   OB History   Grav Para Term Preterm Abortions TAB SAB Ect Mult Living                 Review of Systems  Unable to perform ROS Constitutional: Positive for fever (100 F at home).  Respiratory: Positive for cough, sputum production (green), shortness of breath and wheezing. Negative for hemoptysis.   Cardiovascular: Positive for chest pain (only with cough, no pleurisy) and leg swelling.  Gastrointestinal: Negative for vomiting and abdominal pain.  Skin: Negative for rash.  All other systems reviewed and are negative.     Allergies  Review of patient's allergies indicates no known allergies.  Home Medications   Prior to Admission medications   Medication Sig Start Date End Date Taking? Authorizing Provider  aspirin 81  MG tablet Take 81 mg by mouth daily.  09/18/13   Thayer Headings, MD  atorvastatin (LIPITOR) 40 MG tablet Take 40 mg by mouth every morning.     Historical Provider, MD  b complex vitamins tablet Take 1 tablet by mouth daily.    Historical Provider, MD  Cholecalciferol (VITAMIN D-3) 5000 UNITS TABS Take 5,000 Units by mouth every morning.    Historical Provider, MD  dabigatran (PRADAXA) 150 MG CAPS capsule Take 150 mg by mouth 2 (two) times daily.    Historical Provider, MD  diltiazem (DILACOR XR) 180 MG 24 hr capsule Take 1 capsule (180 mg total) by mouth daily. 09/18/13   Thayer Headings, MD  dofetilide (TIKOSYN) 250 MCG capsule Take 1 capsule (250 mcg total) by mouth 2 (two) times daily. 09/18/13   Thayer Headings, MD  FLUoxetine (PROZAC) 20 MG capsule TAKE ONE CAPSULE BY MOUTH IN THE MORNING    [name], MD  furosemide (LASIX) 40 MG tablet Take 1 tablet (40 mg total) by mouth 2 (two) times daily. 10/20/13   Thayer Headings, MD  ketoconazole (NIZORAL) 2 % cream Apply 1 application topically 2 (two) times daily.    Historical Provider, MD  losartan (COZAAR) 100 MG tablet Take 1 tablet (100 mg total) by mouth daily. 09/18/13   Thayer Headings, MD  Lysine HCl 1000 MG TABS Take 1 tablet by mouth daily as needed (for fever blisters).     Historical Provider, MD  metoprolol succinate (TOPROL-XL) 100 MG 24 hr tablet Take with or immediately following a meal. Take 100mg  (1 tablet) in am, and 50mg  (1/2 tablet) in pm 10/20/13   Thayer Headings, MD  Multiple Vitamin (MULTIVITAMIN) tablet Take 1 tablet by mouth daily.      Historical Provider, MD  NEOMYCIN-POLYMYXIN-HYDROCORTISONE (CORTISPORIN) 1 % SOLN otic solution Place 3 drops into the left ear 4 (four) times daily. 12/19/13   Zenaida Niece, PA-C  nitroGLYCERIN (NITROSTAT) 0.4 MG SL tablet Place 1 tablet (0.4 mg total) under the tongue every 5 (five) minutes as needed. 09/18/13   Thayer Headings, MD  potassium chloride SA (K-DUR,KLOR-CON) 20 MEQ tablet  Take 1 tablet (20 mEq total) by mouth 2 (two) times daily. 10/20/13   Thayer Headings, MD  vitamin B-12 (CYANOCOBALAMIN) 1000 MCG tablet Take 1,000 mcg by mouth daily.    Historical Provider, MD  vitamin E 1000 UNIT capsule Take 1,000 Units by mouth daily.    Historical Provider, MD   BP 160/78  Pulse 68  Temp(Src) 98.7  F (37.1 C) (Oral)  Resp 28  Ht 5\' 6"  (1.676 m)  Wt 235 lb (106.595 kg)  BMI 37.95 kg/m2  SpO2 92% Physical Exam  Nursing note and vitals reviewed. Constitutional: She is oriented to person, place, and time. She appears well-developed and well-nourished. No distress.  On 2-3L [place]  HENT:  Head: Normocephalic and atraumatic.  Nose: Nose normal.  Eyes: Conjunctivae are normal. Pupils are equal, round, and reactive to light.  Neck: Normal range of motion. Neck supple. JVD (3-4 cm) present. No tracheal deviation present.  Cardiovascular: Normal rate, regular rhythm and normal heart sounds.   No murmur heard. Pulmonary/Chest: Effort normal. No respiratory distress. She has wheezes (insp diffusely). She has rales (bibasilar). She exhibits no tenderness.  Abdominal: Soft. Bowel sounds are normal. She exhibits no distension and no mass. There is no tenderness.  Musculoskeletal: Normal range of motion. She exhibits edema (bilateral to feet, no erythema or warmth).  Neurological: She is alert and oriented to person, place, and time.  Skin: Skin is warm and dry. No rash noted.  Psychiatric: She has a normal mood and affect.    ED Course  Procedures (including critical care time) Labs Review Labs Reviewed  PRO B NATRIURETIC PEPTIDE - Abnormal; Notable for the following:    Pro B Natriuretic peptide (BNP) 1006.0 (*)    All other components within normal limits  CBC WITH DIFFERENTIAL - Abnormal; Notable for the following:    Monocytes Absolute 1.1 (*)    Eosinophils Relative 7 (*)    All other components within normal limits  COMPREHENSIVE METABOLIC PANEL - Abnormal; Notable  for the following:    Glucose, Bld 119 (*)    ALT 43 (*)    Alkaline Phosphatase 151 (*)    GFR calc non Af Amer 63 (*)    GFR calc Af Amer 73 (*)    All other components within normal limits  TROPONIN I    Imaging Review Dg Chest 2 View  03/21/2014   CLINICAL DATA:  Short of breath and productive cough  EXAM: CHEST  2 VIEW  COMPARISON:  09/24/2012  FINDINGS: Left-sided pacemaker overlies normal cardiac silhouette. Prominent pulmonary arteries similar prior. There is increased central venous pulmonary congestion. No pleural fluid. No pneumothorax.  IMPRESSION: Increased central venous pulmonary congestion.  Prominent pulmonary arteries.   Electronically Signed   By: Suzy Bouchard M.D.   On: 03/21/2014 21:02     EKG Interpretation None      MDM   Final diagnoses:  CHF exacerbation  Bronchitis    Multiple medical co morbidities presents for worsening orthopnea, LE edema and DOE.  +diet indiscretion. BNP elevated but at prior levels. CXR with pulm vasc congestion.  Gave Lasix. Not in reps distress.  Hypoxic on arrival improved with 3L [place] O2. Trop negative, needs delta. EKG without new ischemic changes.  No signs of DVT, considered PE but primary diagnosis much more likely without hemoptysis, current chest pain, tachycardia, DVT signs, or hx of PE.  Preceded by URI sx may have bronchitis as well.   10:09 PM spoke with cardiologist who feels pt would be more appropriate for hospitalist service  Admit to hospitalist  Tammy Sours, MD 03/22/14 0006

## 2014-03-22 DIAGNOSIS — I509 Heart failure, unspecified: Secondary | ICD-10-CM

## 2014-03-22 DIAGNOSIS — I5032 Chronic diastolic (congestive) heart failure: Secondary | ICD-10-CM

## 2014-03-22 DIAGNOSIS — Z7901 Long term (current) use of anticoagulants: Secondary | ICD-10-CM

## 2014-03-22 LAB — CBC WITH DIFFERENTIAL/PLATELET
Basophils Absolute: 0.1 10*3/uL (ref 0.0–0.1)
Basophils Relative: 1 % (ref 0–1)
Eosinophils Absolute: 0.5 10*3/uL (ref 0.0–0.7)
Eosinophils Relative: 5 % (ref 0–5)
HCT: 38.1 % (ref 36.0–46.0)
Hemoglobin: 12.7 g/dL (ref 12.0–15.0)
Lymphocytes Relative: 15 % (ref 12–46)
Lymphs Abs: 1.5 10*3/uL (ref 0.7–4.0)
MCH: 30.7 pg (ref 26.0–34.0)
MCHC: 33.3 g/dL (ref 30.0–36.0)
MCV: 92 fL (ref 78.0–100.0)
Monocytes Absolute: 1.3 10*3/uL — ABNORMAL HIGH (ref 0.1–1.0)
Monocytes Relative: 13 % — ABNORMAL HIGH (ref 3–12)
Neutro Abs: 6.7 10*3/uL (ref 1.7–7.7)
Neutrophils Relative %: 66 % (ref 43–77)
Platelets: 277 10*3/uL (ref 150–400)
RBC: 4.14 MIL/uL (ref 3.87–5.11)
RDW: 14 % (ref 11.5–15.5)
WBC: 10.1 10*3/uL (ref 4.0–10.5)

## 2014-03-22 LAB — COMPREHENSIVE METABOLIC PANEL
ALT: 38 U/L — ABNORMAL HIGH (ref 0–35)
AST: 26 U/L (ref 0–37)
Albumin: 3.3 g/dL — ABNORMAL LOW (ref 3.5–5.2)
Alkaline Phosphatase: 146 U/L — ABNORMAL HIGH (ref 39–117)
Anion gap: 16 — ABNORMAL HIGH (ref 5–15)
BUN: 16 mg/dL (ref 6–23)
CO2: 26 mEq/L (ref 19–32)
Calcium: 8.8 mg/dL (ref 8.4–10.5)
Chloride: 99 mEq/L (ref 96–112)
Creatinine, Ser: 0.84 mg/dL (ref 0.50–1.10)
GFR calc Af Amer: 81 mL/min — ABNORMAL LOW (ref >90)
GFR calc non Af Amer: 70 mL/min — ABNORMAL LOW (ref >90)
Glucose, Bld: 114 mg/dL — ABNORMAL HIGH (ref 70–99)
Potassium: 3.9 mEq/L (ref 3.7–5.3)
Sodium: 141 mEq/L (ref 137–147)
Total Bilirubin: 0.5 mg/dL (ref 0.3–1.2)
Total Protein: 7.1 g/dL (ref 6.0–8.3)

## 2014-03-22 LAB — TSH: TSH: 3.53 u[IU]/mL (ref 0.350–4.500)

## 2014-03-22 MED ORDER — GUAIFENESIN-DM 100-10 MG/5ML PO SYRP
5.0000 mL | ORAL_SOLUTION | ORAL | Status: DC | PRN
  Administered 2014-03-22 – 2014-03-24 (×5): 5 mL via ORAL
  Filled 2014-03-22 (×5): qty 5

## 2014-03-22 MED ORDER — METOPROLOL SUCCINATE ER 50 MG PO TB24
50.0000 mg | ORAL_TABLET | Freq: Every day | ORAL | Status: DC
  Administered 2014-03-22 – 2014-03-23 (×3): 50 mg via ORAL
  Filled 2014-03-22 (×4): qty 1

## 2014-03-22 NOTE — Progress Notes (Signed)
[facility] PROGRESS NOTE  [name] [id number] DOB: 05-Sep-1945 DOA: 03/21/2014 PCP: Lottie Dawson, MD  Assessment/Plan: #1. Decompensated diastolic CHF last EF measured was 55-60% - notable improvement with lasix thus far. Cont to monitor i/o and daily wt #2. CAD s/p stenting - Asymptomatic. On anticoagulants and aspirin.  #3. History of tachybradycardia syndrome status post pacemaker placement - on anticoagulants which will be continued. Continue antiarrhythmics.  #4. History of hyperglycemia - euglycemic on bmet #5. History of OSA and pulmonary hypertension - on diuretics and CPAP.  #6. Hypertension - controlled. Cont current regimen  Code Status: Full Family Communication: Pt in room (indicate person spoken with, relationship, and if by phone, the number) Disposition Plan: Pending   Consultants:    Procedures:    Antibiotics:   (indicate start date, and stop date if known)  HPI/Subjective: No acute events. Pt feels better today  Objective: Filed Vitals:   03/21/14 2200 03/21/14 2300 03/21/14 2344 03/22/14 0644  BP: 130/50 140/73 142/57 124/56  Pulse: 64 66 63 69  Temp:   98 F (36.7 C) 98 F (36.7 C)  TempSrc:   Oral Oral  Resp: 25 28 25 21   Height:   5\' 6"  (1.676 m)   Weight:   111.494 kg (245 lb 12.8 oz) 110.224 kg (243 lb)  SpO2: 95% 93% 96% 95%    Intake/Output Summary (Last 24 hours) at 03/22/14 1923 Last data filed at 03/22/14 1700  Gross per 24 hour  Intake    702 ml  Output   1850 ml  Net  -1148 ml   Filed Weights   03/21/14 1925 03/21/14 2344 03/22/14 0644  Weight: 106.595 kg (235 lb) 111.494 kg (245 lb 12.8 oz) 110.224 kg (243 lb)    Exam:   General:  Awake, in nad  Cardiovascular: regular, s1, s2  Respiratory: normal resp effort, no wheezing  Abdomen: soft, nondistended  Musculoskeletal: perfused, no clubbing   Data Reviewed: Basic Metabolic Panel:  Recent Labs Lab 03/21/14 1943 03/22/14 0503  NA 138 141   K 3.8 3.9  CL 96 99  CO2 28 26  GLUCOSE 119* 114*  BUN 20 16  CREATININE 0.91 0.84  CALCIUM 9.4 8.8   Liver Function Tests:  Recent Labs Lab 03/21/14 1943 03/22/14 0503  AST 30 26  ALT 43* 38*  ALKPHOS 151* 146*  BILITOT 0.4 0.5  PROT 7.5 7.1  ALBUMIN 3.6 3.3*   No results found for this basename: LIPASE, AMYLASE,  in the last 168 hours No results found for this basename: AMMONIA,  in the last 168 hours CBC:  Recent Labs Lab 03/21/14 1943 03/22/14 0503  WBC 10.2 10.1  NEUTROABS 6.8 6.7  HGB 12.8 12.7  HCT 39.0 38.1  MCV 92.6 92.0  PLT 287 277   Cardiac Enzymes:  Recent Labs Lab 03/21/14 1945  TROPONINI <0.30   BNP (last 3 results)  Recent Labs  10/03/13 1545 03/21/14 1945  PROBNP 1220.0* 1006.0*   CBG: No results found for this basename: GLUCAP,  in the last 168 hours  No results found for this or any previous visit (from the past 240 hour(s)).   Studies: Dg Chest 2 View  03/21/2014   CLINICAL DATA:  Short of breath and productive cough  EXAM: CHEST  2 VIEW  COMPARISON:  09/24/2012  FINDINGS: Left-sided pacemaker overlies normal cardiac silhouette. Prominent pulmonary arteries similar prior. There is increased central venous pulmonary congestion. No pleural fluid. No pneumothorax.  IMPRESSION: Increased central  venous pulmonary congestion.  Prominent pulmonary arteries.   Electronically Signed   By: Suzy Bouchard M.D.   On: 03/21/2014 21:02    Scheduled Meds: . aspirin EC  81 mg Oral Daily  . atorvastatin  40 mg Oral q morning - 10a  . dabigatran  150 mg Oral BID  . diltiazem  180 mg Oral Daily  . dofetilide  250 mcg Oral BID  . FLUoxetine  20 mg Oral Daily  . furosemide  80 mg Intravenous Q12H  . losartan  100 mg Oral Daily  . metoprolol succinate  100 mg Oral Daily  . metoprolol succinate  50 mg Oral QHS  . potassium chloride SA  20 mEq Oral BID  . sodium chloride  3 mL Intravenous Q12H  . sodium chloride  3 mL Intravenous Q12H  .  vitamin E  1,000 Units Oral Daily   Continuous Infusions:   Principal Problem:   Acute exacerbation of CHF (congestive heart failure) Active Problems:   OBSTRUCTIVE SLEEP APNEA   HYPERTENSION   CAD- previous LAD and RCA stents- patent at cath 10/06/13   ATRIAL FIBRILLATION, PAROXYSMAL   CHF (congestive heart failure)  Time spent: 68min  Aliese Brannum, [place] [facility] Pager [phone]. If 7PM-7AM, please contact night-coverage at [email or web address], password [facility] 03/22/2014, 7:23 PM  LOS: 1 day

## 2014-03-22 NOTE — Plan of Care (Signed)
Problem: Phase III Progression Outcomes Goal: Fluid volume status improved Outcome: Progressing Continuing diuresis.  Decrease in edema lower extremities.  Lost 2.2 lbs.

## 2014-03-22 NOTE — ED Provider Notes (Signed)
I saw and evaluated the patient, reviewed the resident's note and I agree with the findings and plan.   EKG Interpretation   Date/Time:  Saturday March 21 2014 19:21:50 EDT Ventricular Rate:  70 PR Interval:  182 QRS Duration: 80 QT Interval:  398 QTC Calculation: 429 R Axis:   84 Text Interpretation:  Normal sinus rhythm Nonspecific ST abnormality  Abnormal ECG since last tracing no significant change Confirmed by Jaquisha Frech   MD, Renu Asby ([id number]) on 03/22/2014 12:50:30 AM      Pt with hx of CHF, has increased SOB, orthopnea, pedal edema.  Plan diuresis, admission.  Malvin Johns, MD 03/22/14 [phone]

## 2014-03-22 NOTE — Progress Notes (Signed)
Admitted pt to rm 3E26 from ED via stretcher, placed on bed comfortably, oriented to room, call bell placed within reach, admission assessment done, orders carried out. Husband at bedside. Will continue to monitor.  03/21/14 2344  Vitals  Temp 98 F (36.7 C)  Temp src Oral  BP ! 142/57 mmHg  BP Location Right arm  BP Method Automatic  Patient Position (if appropriate) Sitting  Pulse Rate 63  Pulse Rate Source Dinamap  Resp ! 25  Oxygen Therapy  SpO2 96 %  O2 Device Nasal cannula  Height and Weight  Height 5\' 6"  (1.676 m)  Weight 111.494 kg (245 lb 12.8 oz)  Type of Scale Used Standing  Type of Weight Actual  BSA (Calculated - sq m) 2.28 sq meters  BMI (Calculated) 39.8  Weight in (lb) to have BMI = 25 154.6

## 2014-03-23 ENCOUNTER — Inpatient Hospital Stay (HOSPITAL_COMMUNITY): Payer: Medicare HMO

## 2014-03-23 DIAGNOSIS — E669 Obesity, unspecified: Secondary | ICD-10-CM

## 2014-03-23 LAB — BASIC METABOLIC PANEL
Anion gap: 13 (ref 5–15)
Anion gap: 14 (ref 5–15)
BUN: 20 mg/dL (ref 6–23)
BUN: 20 mg/dL (ref 6–23)
CO2: 26 mEq/L (ref 19–32)
CO2: 30 mEq/L (ref 19–32)
Calcium: 9.3 mg/dL (ref 8.4–10.5)
Calcium: 9.3 mg/dL (ref 8.4–10.5)
Chloride: 93 mEq/L — ABNORMAL LOW (ref 96–112)
Chloride: 95 mEq/L — ABNORMAL LOW (ref 96–112)
Creatinine, Ser: 0.76 mg/dL (ref 0.50–1.10)
Creatinine, Ser: 0.91 mg/dL (ref 0.50–1.10)
GFR calc Af Amer: >90 mL/min (ref >90)
GFR calc Af Amer: 73 mL/min — ABNORMAL LOW (ref >90)
GFR calc non Af Amer: 85 mL/min — ABNORMAL LOW (ref >90)
GFR calc non Af Amer: 63 mL/min — ABNORMAL LOW (ref >90)
Glucose, Bld: 133 mg/dL — ABNORMAL HIGH (ref 70–99)
Glucose, Bld: 122 mg/dL — ABNORMAL HIGH (ref 70–99)
Potassium: 6.7 mEq/L (ref 3.7–5.3)
Potassium: 3.8 mEq/L (ref 3.7–5.3)
Sodium: 132 mEq/L — ABNORMAL LOW (ref 137–147)
Sodium: 139 mEq/L (ref 137–147)

## 2014-03-23 NOTE — Clinical Documentation Improvement (Signed)
  Noted patient admitted with Acute Diastolic CHF decompensation also documented is treated HTN. Please clarify in Notes if patient has Hypertensive Heart Disease to reflect severity of illness and risk of mortality. Thank you.  Thank You, Ezekiel Ina ,RN Clinical Documentation Specialist:  [phone]  [place] [facility]

## 2014-03-23 NOTE — Progress Notes (Signed)
Patient alert and responsive. Denies pain and discomfort. Denies SOB. At this time. Continues with IV lasix as ordered. Patient has ambulated in hallway independently.

## 2014-03-23 NOTE — Care Management Note (Addendum)
    Page 1 of 1   03/24/2014     12:13:43 PM CARE MANAGEMENT NOTE 03/24/2014  Patient:  [name], [name]   Account Number:  [id number]  Date Initiated:  03/23/2014  Documentation initiated by:  Fuller Mandril  Subjective/Objective Assessment:   68 yo female. Decompensated diastolic CHF last EF measured was 55-60%.// Home with spouse.     Action/Plan:   Lasix 80 mg IV every 12 hourly closely follow intake output and metabolic panel and daily weights.// Access for disposition needs   Anticipated DC Date:  03/24/2014   Anticipated DC Plan:  [place]         Choice offered to / List presented to:          [facility] arranged  [place] - 11 Patient Refused      Status of service:  In process, will continue to follow Medicare Important Message given?  YES (If response is "NO", the following Medicare IM given date fields will be blank) Date Medicare IM given:  03/24/2014 Medicare IM given by:  [facility] Date Additional Medicare IM given:   Additional Medicare IM given by:    Discharge Disposition:    Per UR Regulation:  Reviewed for med. necessity/level of care/duration of stay  If discussed at [place] of Stay Meetings, dates discussed:    Comments:  03/24/14 [place], RN, BSN, [place] [phone] I have recommended that this patient have [place] but she declines at this time. I have discussed the risks and benefits of this service with her. The patient verbalizes understanding.

## 2014-03-23 NOTE — Progress Notes (Signed)
CRITICAL VALUE ALERT  Critical value received:  Potassium 6.7  Date of notification:  03/23/2014  Time of notification:  12:20  Critical value read back:Yes.  yes  Nurse who received alert:  Sherene Sires MD notified (1st page):  Lesia Hausen  Time of first page:  12:25  MD notified (2nd page):none  Time of second page:none  Responding MD:  Fredia Beets  Time MD responded:  1300

## 2014-03-23 NOTE — Progress Notes (Signed)
[facility] PROGRESS NOTE  BRINN WESTBY [id number] DOB: Nov 01, 1945 DOA: 03/21/2014 PCP: Lottie Dawson, MD  Assessment/Plan: #1. Decompensated diastolic CHF last EF measured was 55-60% - notable improvement with lasix thus far. Dry weight of 108, presenting 111. Currently 109 today. Cont to monitor i/o and daily wt #2. CAD s/p stenting - Asymptomatic. On anticoagulants and aspirin.  #3. History of tachybradycardia syndrome status post pacemaker placement - on anticoagulants which will be continued. Continue antiarrhythmics.  #4. History of hyperglycemia - euglycemic on bmet #5. History of OSA and pulmonary hypertension - on diuretics and CPAP.  #6. Hypertension - controlled. Cont current regimen #7. Elevated alk phos - Follow up RUQ [place] results. Asymptomatic. Tolerating PO.  Code Status: Full Family Communication: Pt in room Disposition Plan: Pending  Consultants:    Procedures:    Antibiotics:     HPI/Subjective: No acute events. Pt feels better today  Objective: Filed Vitals:   03/22/14 2048 03/22/14 2305 03/23/14 0541 03/23/14 1117  BP: 135/62  115/76 131/54  Pulse: 65 64 60 58  Temp: 98.9 F (37.2 C)  98.4 F (36.9 C) 98.2 F (36.8 C)  TempSrc: Oral  Oral Oral  Resp: '20 22 22 20  ' Height:      Weight:   109.226 kg (240 lb 12.8 oz)   SpO2: 95%  94% 95%    Intake/Output Summary (Last 24 hours) at 03/23/14 1435 Last data filed at 03/23/14 1205  Gross per 24 hour  Intake    830 ml  Output   1875 ml  Net  -1045 ml   Filed Weights   03/21/14 2344 03/22/14 0644 03/23/14 0541  Weight: 111.494 kg (245 lb 12.8 oz) 110.224 kg (243 lb) 109.226 kg (240 lb 12.8 oz)    Exam:   General:  Awake, in nad  Cardiovascular: regular, s1, s2  Respiratory: normal resp effort, no wheezing  Abdomen: soft, nondistended  Musculoskeletal: perfused, no clubbing   Data Reviewed: Basic Metabolic Panel:  Recent Labs Lab 03/21/14 1943 03/22/14 0503  03/23/14 1100  NA 138 141 132*  K 3.8 3.9 6.7*  CL 96 99 93*  CO2 '28 26 26  ' GLUCOSE 119* 114* 133*  BUN '20 16 20  ' CREATININE 0.91 0.84 0.76  CALCIUM 9.4 8.8 9.3   Liver Function Tests:  Recent Labs Lab 03/21/14 1943 03/22/14 0503  AST 30 26  ALT 43* 38*  ALKPHOS 151* 146*  BILITOT 0.4 0.5  PROT 7.5 7.1  ALBUMIN 3.6 3.3*   No results found for this basename: LIPASE, AMYLASE,  in the last 168 hours No results found for this basename: AMMONIA,  in the last 168 hours CBC:  Recent Labs Lab 03/21/14 1943 03/22/14 0503  WBC 10.2 10.1  NEUTROABS 6.8 6.7  HGB 12.8 12.7  HCT 39.0 38.1  MCV 92.6 92.0  PLT 287 277   Cardiac Enzymes:  Recent Labs Lab 03/21/14 1945  TROPONINI <0.30   BNP (last 3 results)  Recent Labs  10/03/13 1545 03/21/14 1945  PROBNP 1220.0* 1006.0*   CBG: No results found for this basename: GLUCAP,  in the last 168 hours  No results found for this or any previous visit (from the past 240 hour(s)).   Studies: Dg Chest 2 View  03/21/2014   CLINICAL DATA:  Short of breath and productive cough  EXAM: CHEST  2 VIEW  COMPARISON:  09/24/2012  FINDINGS: Left-sided pacemaker overlies normal cardiac silhouette. Prominent pulmonary arteries similar prior. There is increased central  venous pulmonary congestion. No pleural fluid. No pneumothorax.  IMPRESSION: Increased central venous pulmonary congestion.  Prominent pulmonary arteries.   Electronically Signed   By: Suzy Bouchard M.D.   On: 03/21/2014 21:02   US Abdomen Limited Ruq  03/23/2014   CLINICAL DATA:  Elevated alkaline phosphatase.  EXAM: US ABDOMEN LIMITED - RIGHT UPPER QUADRANT  COMPARISON:  CT scan 08/25/2012  FINDINGS: Gallbladder:  Surgically absent.  Common bile duct:  Diameter: 6.5 mm (within normal limits given prior cholecystectomy).  Liver:  Diffuse increased echogenicity with decreased through transmission and poor definition of the liver architecture suggesting diffuse fatty  infiltration. No focal hepatic lesions or intrahepatic biliary dilatation.  IMPRESSION: Diffuse fatty infiltration of the liver but no focal hepatic lesions or biliary dilatation.  Status postcholecystectomy with normal caliber common bile duct.   Electronically Signed   By: Kalman Jewels M.D.   On: 03/23/2014 09:33    Scheduled Meds: . aspirin EC  81 mg Oral Daily  . atorvastatin  40 mg Oral q morning - 10a  . dabigatran  150 mg Oral BID  . diltiazem  180 mg Oral Daily  . dofetilide  250 mcg Oral BID  . FLUoxetine  20 mg Oral Daily  . furosemide  80 mg Intravenous Q12H  . losartan  100 mg Oral Daily  . metoprolol succinate  100 mg Oral Daily  . metoprolol succinate  50 mg Oral QHS  . potassium chloride SA  20 mEq Oral BID  . sodium chloride  3 mL Intravenous Q12H  . sodium chloride  3 mL Intravenous Q12H  . vitamin E  1,000 Units Oral Daily   Continuous Infusions:   Principal Problem:   Acute exacerbation of CHF (congestive heart failure) Active Problems:   OBSTRUCTIVE SLEEP APNEA   HYPERTENSION   CAD- previous LAD and RCA stents- patent at cath 10/06/13   ATRIAL FIBRILLATION, PAROXYSMAL   CHF (congestive heart failure)  Time spent: 59mn  Lilymae Swiech, [facility] Pager [phone] If 7PM-7AM, please contact night-coverage at [email or web address], password [facility]9/21/2015, 2:35 PM  LOS: 2 days

## 2014-03-24 ENCOUNTER — Telehealth: Payer: Self-pay | Admitting: Internal Medicine

## 2014-03-24 DIAGNOSIS — I2510 Atherosclerotic heart disease of native coronary artery without angina pectoris: Secondary | ICD-10-CM

## 2014-03-24 LAB — BASIC METABOLIC PANEL
Anion gap: 12 (ref 5–15)
BUN: 24 mg/dL — ABNORMAL HIGH (ref 6–23)
CO2: 31 mEq/L (ref 19–32)
Calcium: 9.5 mg/dL (ref 8.4–10.5)
Chloride: 96 mEq/L (ref 96–112)
Creatinine, Ser: 0.94 mg/dL (ref 0.50–1.10)
GFR calc Af Amer: 71 mL/min — ABNORMAL LOW (ref >90)
GFR calc non Af Amer: 61 mL/min — ABNORMAL LOW (ref >90)
Glucose, Bld: 107 mg/dL — ABNORMAL HIGH (ref 70–99)
Potassium: 4.6 mEq/L (ref 3.7–5.3)
Sodium: 139 mEq/L (ref 137–147)

## 2014-03-24 LAB — MAGNESIUM: Magnesium: 2.2 mg/dL (ref 1.5–2.5)

## 2014-03-24 MED ORDER — BENZONATATE 100 MG PO CAPS: 100.0000 mg | ORAL_CAPSULE | Freq: Three times a day (TID) | ORAL | Status: DC | PRN

## 2014-03-24 MED ORDER — BENZONATATE 100 MG PO CAPS
100.0000 mg | ORAL_CAPSULE | Freq: Three times a day (TID) | ORAL | Status: DC | PRN
  Administered 2014-03-24: 100 mg via ORAL
  Filled 2014-03-24 (×3): qty 1

## 2014-03-24 NOTE — Progress Notes (Signed)
Patient discharged home. Discharged instructions given and med list reviewed. Patient verbalized understanding.

## 2014-03-24 NOTE — Telephone Encounter (Signed)
Pt needs post hosp fup the week of 9/28-10/2.  No 30 min appt.  [facility] called to set this appt. Scheduled w/ padonda. If you can work the pt in. please let me know and I will resc.

## 2014-03-24 NOTE — Progress Notes (Signed)
Pt. Has her home CPAP set up & stated that she would placed herself on CPAP before going to bed. Pt. Was made aware to call if she needed assistance.

## 2014-03-24 NOTE — Discharge Summary (Signed)
Physician Discharge Summary  [name] NTZ:[id number] DOB: 03-01-1946 DOA: 03/21/2014  PCP: Lottie Dawson, MD  Admit date: 03/21/2014 Discharge date: 03/24/2014  Time spent: 35 minutes  Recommendations for Outpatient Follow-up:  1. Follow up with PCP in 1-2 weeks 2. Follow up with Cardiology at earliest possible appt  Discharge Diagnoses:  Principal Problem:   Acute exacerbation of CHF (congestive heart failure) Active Problems:   OBSTRUCTIVE SLEEP APNEA   HYPERTENSION   CAD- previous LAD and RCA stents- patent at cath 10/06/13   ATRIAL FIBRILLATION, PAROXYSMAL   CHF (congestive heart failure)   Discharge Condition: Improved  Diet recommendation: Heart healthy  Filed Weights   03/22/14 0644 03/23/14 0541 03/24/14 0641  Weight: 110.224 kg (243 lb) 109.226 kg (240 lb 12.8 oz) 109.362 kg (241 lb 1.6 oz)    History of present illness:  See admit h and p from 9/19 for details. Briefly, pt presents with sob in the setting of chronic grade 2 diastolic chf. Pt was admitted for further work up and management.  Hospital Course:  #1. Decompensated diastolic CHF last EF measured was 55-60% - notable improvement with IV lasix. Dry weight of 108, presenting 111. Wt on discharge remained steady at 109, question if this is new dry wt. Cont to monitor i/o and daily wt. Pt advised to follow up closely with cardiologist #2. CAD s/p stenting - Asymptomatic. On anticoagulants and aspirin.  #3. History of tachybradycardia syndrome status post pacemaker placement - on anticoagulants which will be continued. Continued antiarrhythmics.  #4. History of hyperglycemia - euglycemic on bmet  #5. History of OSA and pulmonary hypertension - on diuretics and CPAP.  #6. Hypertension - Remained controlled. Cont on current regimen  #7. Elevated alk phos - RUQ [place] with no signs of biliary tree obstruction. Pt is s/p cholecystectomy. Remained asymptomatic. Tolerating PO.  Consultations:  none  Discharge  Exam: Filed Vitals:   03/23/14 1547 03/23/14 2126 03/24/14 0619 03/24/14 0641  BP: 125/63 123/60 95/73   Pulse: 58 62 64   Temp: 99 F (37.2 C) 98.3 F (36.8 C) 99.2 F (37.3 C)   TempSrc: Oral Oral Oral   Resp: '18 18 18   ' Height:      Weight:    109.362 kg (241 lb 1.6 oz)  SpO2: 97% 96% 95%     General: Awake in nad Cardiovascular: regular, s1, s2 Respiratory: normal resp effort, no wheezing  Discharge Instructions     Medication List         aspirin 81 MG tablet  Take 81 mg by mouth daily.     atorvastatin 40 MG tablet  Commonly known as:  LIPITOR  Take 40 mg by mouth every morning.     b complex vitamins tablet  Take 1 tablet by mouth daily.     benzonatate 100 MG capsule  Commonly known as:  TESSALON  Take 1 capsule (100 mg total) by mouth 3 (three) times daily as needed for cough.     dabigatran 150 MG Caps capsule  Commonly known as:  PRADAXA  Take 150 mg by mouth 2 (two) times daily.     diltiazem 180 MG 24 hr capsule  Commonly known as:  DILACOR XR  Take 1 capsule (180 mg total) by mouth daily.     dofetilide 250 MCG capsule  Commonly known as:  TIKOSYN  Take 1 capsule (250 mcg total) by mouth 2 (two) times daily.     FLUoxetine 20 MG capsule  Commonly known as:  PROZAC  TAKE ONE CAPSULE BY MOUTH IN THE MORNING     furosemide 40 MG tablet  Commonly known as:  LASIX  Take 1 tablet (40 mg total) by mouth 2 (two) times daily.     losartan 100 MG tablet  Commonly known as:  COZAAR  Take 1 tablet (100 mg total) by mouth daily.     Lysine HCl 1000 MG Tabs  Take 1 tablet by mouth daily as needed (for fever blisters).     metoprolol succinate 100 MG 24 hr tablet  Commonly known as:  TOPROL-XL  Take with or immediately following a meal. Take 16m (1 tablet) in am, and 525m(1/2 tablet) in pm     multivitamin tablet  Take 1 tablet by mouth daily.     nitroGLYCERIN 0.4 MG SL tablet  Commonly known as:  NITROSTAT  Place 1 tablet (0.4 mg total)  under the tongue every 5 (five) minutes as needed.     potassium chloride SA 20 MEQ tablet  Commonly known as:  K-DUR,KLOR-CON  Take 1 tablet (20 mEq total) by mouth 2 (two) times daily.     vitamin B-12 1000 MCG tablet  Commonly known as:  CYANOCOBALAMIN  Take 1,000 mcg by mouth daily.     Vitamin D-3 5000 UNITS Tabs  Take 5,000 Units by mouth every morning.     vitamin E 1000 UNIT capsule  Take 1,000 Units by mouth daily.       No Known Allergies Follow-up Information   Follow up with PALottie DawsonMD. Schedule an appointment as soon as possible for a visit in 1 week.   Specialties:  Internal Medicine, Pediatrics   Contact information:   [address] [phone]      The results of significant diagnostics from this hospitalization (including imaging, microbiology, ancillary and laboratory) are listed below for reference.    Significant Diagnostic Studies: Dg Chest 2 View  03/21/2014   CLINICAL DATA:  Short of breath and productive cough  EXAM: CHEST  2 VIEW  COMPARISON:  09/24/2012  FINDINGS: Left-sided pacemaker overlies normal cardiac silhouette. Prominent pulmonary arteries similar prior. There is increased central venous pulmonary congestion. No pleural fluid. No pneumothorax.  IMPRESSION: Increased central venous pulmonary congestion.  Prominent pulmonary arteries.   Electronically Signed   By: StSuzy Bouchard.D.   On: 03/21/2014 21:02   UsKoreabdomen Limited Ruq  03/23/2014   CLINICAL DATA:  Elevated alkaline phosphatase.  EXAM: US[place]oreaBDOMEN LIMITED - RIGHT UPPER QUADRANT  COMPARISON:  CT scan 08/25/2012  FINDINGS: Gallbladder:  Surgically absent.  Common bile duct:  Diameter: 6.5 mm (within normal limits given prior cholecystectomy).  Liver:  Diffuse increased echogenicity with decreased through transmission and poor definition of the liver architecture suggesting diffuse fatty infiltration. No focal hepatic lesions or intrahepatic  biliary dilatation.  IMPRESSION: Diffuse fatty infiltration of the liver but no focal hepatic lesions or biliary dilatation.  Status postcholecystectomy with normal caliber common bile duct.   Electronically Signed   By: MaKalman Jewels.D.   On: 03/23/2014 09:33    Microbiology: No results found for this or any previous visit (from the past 240 hour(s)).   Labs: Basic Metabolic Panel:  Recent Labs Lab 03/21/14 1943 03/22/14 0503 03/23/14 1100 03/23/14 1525 03/24/14 0311  NA 138 141 132* 139 139  K 3.8 3.9 6.7* 3.8 4.6  CL 96 99 93* 95* 96  CO2 28  '26 26 30 31  ' GLUCOSE 119* 114* 133* 122* 107*  BUN '20 16 20 20 ' 24*  CREATININE 0.91 0.84 0.76 0.91 0.94  CALCIUM 9.4 8.8 9.3 9.3 9.5  MG  --   --   --   --  2.2   Liver Function Tests:  Recent Labs Lab 03/21/14 1943 03/22/14 0503  AST 30 26  ALT 43* 38*  ALKPHOS 151* 146*  BILITOT 0.4 0.5  PROT 7.5 7.1  ALBUMIN 3.6 3.3*   No results found for this basename: LIPASE, AMYLASE,  in the last 168 hours No results found for this basename: AMMONIA,  in the last 168 hours CBC:  Recent Labs Lab 03/21/14 1943 03/22/14 0503  WBC 10.2 10.1  NEUTROABS 6.8 6.7  HGB 12.8 12.7  HCT 39.0 38.1  MCV 92.6 92.0  PLT 287 277   Cardiac Enzymes:  Recent Labs Lab 03/21/14 1945  TROPONINI <0.30   BNP: BNP (last 3 results)  Recent Labs  10/03/13 1545 03/21/14 1945  PROBNP 1220.0* 1006.0*   CBG: No results found for this basename: GLUCAP,  in the last 168 hours  Signed:  Calliope Delangel K  [facility] 03/24/2014, 9:57 AM

## 2014-03-25 DIAGNOSIS — I495 Sick sinus syndrome: Secondary | ICD-10-CM

## 2014-03-27 ENCOUNTER — Encounter: Payer: Self-pay | Admitting: *Deleted

## 2014-03-30 ENCOUNTER — Ambulatory Visit (INDEPENDENT_AMBULATORY_CARE_PROVIDER_SITE_OTHER): Payer: Medicare HMO | Admitting: Physician Assistant

## 2014-03-30 ENCOUNTER — Encounter: Payer: Self-pay | Admitting: Physician Assistant

## 2014-03-30 VITALS — BP 114/76 | HR 58 | Ht 66.0 in | Wt 244.0 lb

## 2014-03-30 DIAGNOSIS — I480 Paroxysmal atrial fibrillation: Secondary | ICD-10-CM

## 2014-03-30 DIAGNOSIS — I10 Essential (primary) hypertension: Secondary | ICD-10-CM

## 2014-03-30 DIAGNOSIS — R1013 Epigastric pain: Secondary | ICD-10-CM

## 2014-03-30 DIAGNOSIS — I4891 Unspecified atrial fibrillation: Secondary | ICD-10-CM

## 2014-03-30 DIAGNOSIS — K3189 Other diseases of stomach and duodenum: Secondary | ICD-10-CM

## 2014-03-30 DIAGNOSIS — R609 Edema, unspecified: Secondary | ICD-10-CM

## 2014-03-30 MED ORDER — ATORVASTATIN CALCIUM 40 MG PO TABS: 40.0000 mg | ORAL_TABLET | Freq: Every morning | ORAL | Status: DC

## 2014-03-30 MED ORDER — DILTIAZEM HCL ER COATED BEADS 180 MG PO CP24: 180.0000 mg | ORAL_CAPSULE | Freq: Every day | ORAL | Status: DC

## 2014-03-30 MED ORDER — FUROSEMIDE 40 MG PO TABS: 40.0000 mg | ORAL_TABLET | Freq: Two times a day (BID) | ORAL | Status: DC

## 2014-03-30 MED ORDER — POTASSIUM CHLORIDE CRYS ER 20 MEQ PO TBCR
20.0000 meq | EXTENDED_RELEASE_TABLET | Freq: Two times a day (BID) | ORAL | Status: DC
Start: 2014-03-30 — End: 2015-05-18

## 2014-03-30 MED ORDER — LOSARTAN POTASSIUM 100 MG PO TABS: 100.0000 mg | ORAL_TABLET | Freq: Every day | ORAL | Status: DC

## 2014-03-30 MED ORDER — NITROGLYCERIN 0.4 MG SL SUBL: 0.4000 mg | SUBLINGUAL_TABLET | SUBLINGUAL | Status: DC | PRN

## 2014-03-30 NOTE — Patient Instructions (Signed)
Your physician recommends that you continue on your current medications as directed. Please refer to the Current Medication list given to you today.   Your physician recommends that you schedule a follow-up appointment in:  With DR Shelbie Ammons IN 2 MONTHS   Low-Sodium Eating Plan Sodium raises blood pressure and causes water to be held in the body. Getting less sodium from food will help lower your blood pressure, reduce any swelling, and protect your heart, liver, and kidneys. We get sodium by adding salt (sodium chloride) to food. Most of our sodium comes from canned, boxed, and frozen foods. Restaurant foods, fast foods, and pizza are also very high in sodium. Even if you take medicine to lower your blood pressure or to reduce fluid in your body, getting less sodium from your food is important.   WHAT IS MY PLAN? Most people should limit their sodium intake to 2,00 mg a day.    WHAT DO I NEED TO KNOW ABOUT THIS EATING PLAN? For the low-sodium eating plan, you will follow these general guidelines:  Choose foods with a % Daily Value for sodium of less than 5% (as listed on the food label).   Use salt-free seasonings or herbs instead of table salt or sea salt.   Check with your health care provider or pharmacist before using salt substitutes.   Eat fresh foods.  Eat more vegetables and fruits.  Limit canned vegetables. If you do use them, rinse them well to decrease the sodium.   Limit cheese to 1 oz (28 g) per day.   Eat lower-sodium products, often labeled as "lower sodium" or "no salt added."  Avoid foods that contain monosodium glutamate (MSG). MSG is sometimes added to [place] food and some canned foods.  Check food labels (Nutrition Facts labels) on foods to learn how much sodium is in one serving.  Eat more home-cooked food and less restaurant, buffet, and fast food.  When eating at a restaurant, ask that your food be prepared with less salt or none, if possible.   HOW DO I READ FOOD LABELS FOR SODIUM INFORMATION? The Nutrition Facts label lists the amount of sodium in one serving of the food. If you eat more than one serving, you must multiply the listed amount of sodium by the number of servings. Food labels may also identify foods as:  Sodium free--Less than 5 mg in a serving.  Very low sodium--35 mg or less in a serving.  Low sodium--140 mg or less in a serving.  Light in sodium--50% less sodium in a serving. For example, if a food that usually has 300 mg of sodium is changed to become light in sodium, it will have 150 mg of sodium.  Reduced sodium--25% less sodium in a serving. For example, if a food that usually has 400 mg of sodium is changed to reduced sodium, it will have 300 mg of sodium. WHAT FOODS CAN I EAT? Grains Low-sodium cereals, including oats, puffed wheat and rice, and shredded wheat cereals. Low-sodium crackers. Unsalted rice and pasta. Lower-sodium bread.  Vegetables Frozen or fresh vegetables. Low-sodium or reduced-sodium canned vegetables. Low-sodium or reduced-sodium tomato sauce and paste. Low-sodium or reduced-sodium tomato and vegetable juices.  Fruits Fresh, frozen, and canned fruit. Fruit juice.  Meat and Other Protein Products Low-sodium canned tuna and salmon. Fresh or frozen meat, poultry, seafood, and fish. Lamb. Unsalted nuts. Dried beans, peas, and lentils without added salt. Unsalted canned beans. Homemade soups without salt. Eggs.  Dairy Milk. Soy milk.  Ricotta cheese. Low-sodium or reduced-sodium cheeses. Yogurt.  Condiments Fresh and dried herbs and spices. Salt-free seasonings. Onion and garlic powders. Low-sodium varieties of mustard and ketchup. Lemon juice.  Fats and Oils Reduced-sodium salad dressings. Unsalted butter.  Other Unsalted popcorn and pretzels.  The items listed above may not be a complete list of recommended foods or beverages. Contact your dietitian for more options. WHAT  FOODS ARE NOT RECOMMENDED? Grains Instant hot cereals. Bread stuffing, pancake, and biscuit mixes. Croutons. Seasoned rice or pasta mixes. Noodle soup cups. Boxed or frozen macaroni and cheese. Self-rising flour. Regular salted crackers. Vegetables Regular canned vegetables. Regular canned tomato sauce and paste. Regular tomato and vegetable juices. Frozen vegetables in sauces. Salted french fries. Olives. Angie Fava. Relishes. Sauerkraut. Salsa. Meat and Other Protein Products Salted, canned, smoked, spiced, or pickled meats, seafood, or fish. Bacon, ham, sausage, hot dogs, corned beef, chipped beef, and packaged luncheon meats. Salt pork. Jerky. Pickled herring. Anchovies, regular canned tuna, and sardines. Salted nuts. Dairy Processed cheese and cheese spreads. Cheese curds. Blue cheese and cottage cheese. Buttermilk.  Condiments Onion and garlic salt, seasoned salt, table salt, and sea salt. Canned and packaged gravies. Worcestershire sauce. Tartar sauce. Barbecue sauce. Teriyaki sauce. Soy sauce, including reduced sodium. Steak sauce. Fish sauce. Oyster sauce. Cocktail sauce. Horseradish. Regular ketchup and mustard. Meat flavorings and tenderizers. Bouillon cubes. Hot sauce. Tabasco sauce. Marinades. Taco seasonings. Relishes. Fats and Oils Regular salad dressings. Salted butter. Margarine. Ghee. Bacon fat.  Other Potato and tortilla chips. Corn chips and puffs. Salted popcorn and pretzels. Canned or dried soups. Pizza. Frozen entrees and pot pies.  The items listed above may not be a complete list of foods and beverages to avoid. Contact your dietitian for more information. Document Released: 12/09/2001 Document Revised: 06/24/2013 Document Reviewed: 04/23/2013 [facility] Patient Information 2015 [place], [place]. This information is not intended to replace advice given to you by your health care provider. Make sure you discuss any questions you have with your health care provider.

## 2014-03-30 NOTE — Telephone Encounter (Signed)
Can use 930 and 945  On Friday October 2nd   And i can see her instead of padonda on thursday

## 2014-03-30 NOTE — Assessment & Plan Note (Signed)
Patient had recent hospitalization with acute on chronic heart failure after eating salty foods for 3 days. She is back on track with her diet and her weight is at baseline. Have discussed 2 g sodium diet with the patient. She can take an extra Lasix if needed for edema or 2-3 pound weight gain overnight.

## 2014-03-30 NOTE — Assessment & Plan Note (Signed)
Blood pressure controlled. 

## 2014-03-30 NOTE — Assessment & Plan Note (Signed)
Stable without chest pain 

## 2014-03-30 NOTE — Progress Notes (Signed)
HPI: This is a 68 year old female patient of Dr.Nahser with history of coronary artery disease status post bare-metal stents to the RCA and LAD, catheterization in 10/2013 showed minimal coronary artery irregularities normal systolic function and PA pressures were moderately elevated with an estimated PA pressure of around 57. She has history of diastolic heart failure, pulmonary hypertension, paroxysmal atrial fibrillation, status post pacemaker implantation, hypertension, hyperlipidemia, and obstructive sleep apnea on CPAP.  Patient had a recent hospitalization for acute on chronic diastolic congestive heart failure. She diuresed easily with IV Lasix and was sent home. 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 She's been watching her salt closely since she's been home from the hospital. She denies any chest pain, palpitations, dyspnea, dyspnea on exertion, dizziness, or presyncope. She is weighing herself every day.   No Known Allergies   Current Outpatient Prescriptions  Medication Sig Dispense Refill  . aspirin 81 MG tablet Take 81 mg by mouth daily.       [name] atorvastatin (LIPITOR) 40 MG tablet Take 40 mg by mouth every morning.       [name] b complex vitamins tablet Take 1 tablet by mouth daily.      . benzonatate (TESSALON) 100 MG capsule Take 1 capsule (100 mg total) by mouth 3 (three) times daily as needed for cough.  20 capsule  0  . Cholecalciferol (VITAMIN D-3) 5000 UNITS TABS Take 5,000 Units by mouth every morning.      . dabigatran (PRADAXA) 150 MG CAPS capsule Take 150 mg by mouth 2 (two) times daily.      [name] diltiazem (CARDIZEM CD) 180 MG 24 hr capsule       . diltiazem (DILACOR XR) 180 MG 24 hr capsule Take 1 capsule (180 mg total) by mouth daily.  90 capsule  3  . dofetilide (TIKOSYN) 250 MCG capsule Take 1 capsule (250 mcg total) by mouth 2 (two) times daily.  180 capsule  3  .  FLUoxetine (PROZAC) 20 MG capsule TAKE ONE CAPSULE BY MOUTH IN THE MORNING  90 capsule  2  . furosemide (LASIX) 40 MG tablet Take 1 tablet (40 mg total) by mouth 2 (two) times daily.  180 tablet  3  . ketoconazole (NIZORAL) 2 % cream       . losartan (COZAAR) 100 MG tablet Take 1 tablet (100 mg total) by mouth daily.  90 tablet  3  . Lysine HCl 1000 MG TABS Take 1 tablet by mouth daily as needed (for fever blisters).       . metoprolol succinate (TOPROL-XL) 100 MG 24 hr tablet Take with or immediately following a meal. Take 100mg  (1 tablet) in am, and 50mg  (1/2 tablet) in pm  150 tablet  3  . Multiple Vitamin (MULTIVITAMIN) tablet Take 1 tablet by mouth daily.        . nitroGLYCERIN (NITROSTAT) 0.4 MG SL tablet Place 1 tablet (0.4 mg total) under the tongue every 5 (five) minutes as needed.  25 tablet  5  . potassium chloride SA (K-DUR,KLOR-CON) 20 MEQ tablet Take 1 tablet (20 mEq total) by mouth 2 (two) times daily.  180 tablet  3  . vitamin B-12 (CYANOCOBALAMIN) 1000 MCG tablet Take 1,000 mcg by mouth daily.      . vitamin E 1000 UNIT capsule Take 1,000 Units by mouth daily.  No current facility-administered medications for this visit.    Past Medical History  Diagnosis Date  . History of CVA (cerebrovascular accident)   . CAD (coronary artery disease) [id number]    post PTCA with bare-metal stenting to mid RCA in December 2004     . Hypertension   . Chronic atrial fibrillation 06/2007    Tachybradycardia pacemaker  . Pacemaker   . OSA on CPAP   . Anticoagulant long-term use     pradaxa  . Transfusion history   . Hyperlipidemia   . Edema of lower extremity   . History of acute inferior wall MI   . Anxiety   . Diplopia 06/19/2008    Qualifier: Diagnosis of  By: [name] MD, [name]   . Unspecified hemorrhoids without mention of complication [id number]    Colonoscopy--Dr. Carlean Purl   . Myocardial infarction   . Dysrhythmia     ATRIAL FIBRILATION  . TIA (transient ischemic  attack)   . CHF (congestive heart failure)   . Shortness of breath     Past Surgical History  Procedure Laterality Date  . Total abdominal hysterectomy  1984    2 surgeries endometriosis bso  . Permanent pacemaker  06/2007  . Back surgeries      x2-3 by Dr Trenton Gammon  . Coronary stent placement      C stent  . Cholecystectomy  1984  . Coronary angioplasty with stent placement  1998  . Doppler echocardiography  2009  . Back surgery      Family History  Problem Relation Age of Onset  . Suicidality Father     suicide death pt was 3 yrs  . Arrhythmia Mother     heart arrthymia  . Hypertension Mother   . Heart attack Brother     died age 25  . Heart disease Paternal Aunt   . Prostate cancer Maternal Grandfather   . Diabetes Paternal Grandfather     fathers side of the family  . Diabetes Mother     History   Social History  . Marital Status: Married    Spouse Name: N/A    Number of Children: 0  . Years of Education: HS   Occupational History  . retired     previously worked [place] [employer]  . Smoking status: Former Smoker -- 1.00 packs/day for 5 years    Types: Cigarettes    Start date: 05/11/1978    Quit date: 07/03/1982  . Smokeless tobacco: Never Used  . Alcohol Use: Yes     Comment: once q 6 months-socailly-wine  . Drug Use: No  . Sexual Activity: Not Currently   Other Topics Concern  . 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   Married    Originally from [place] of two, high school education   Former smoker [phone] 1ppd   Hunting dogs 7    Retired from [employer] 2     ROS: See history of present illness otherwise negative  BP 114/76  Pulse 58  Ht 5\' 6"  (1.676 m)  Wt 244 lb (110.678 kg)  BMI 39.40 kg/m2  PHYSICAL EXAM: Obese, in no acute distress. Neck: No JVD, HJR, Bruit, or thyroid enlargement  Lungs: Decreased  breath sounds but No tachypnea, clear without wheezing, rales, or rhonchi  Cardiovascular: RRR, PMI not displaced, heart sounds  distant, no murmurs, gallops, bruit, thrill, or heave.  Abdomen: BS normal. Soft without organomegaly, masses, lesions or tenderness.  Extremities: without cyanosis, clubbing or edema. Good distal pulses bilateral  SKin: Warm, no lesions or rashes   Musculoskeletal: No deformities  Neuro: no focal signs   Wt Readings from Last 3 Encounters:  03/30/14 244 lb (110.678 kg)  03/24/14 241 lb 1.6 oz (109.362 kg)  01/20/14 246 lb (111.585 kg)     EKG: Sinus bradycardia at 58 beats per minute with nonspecific ST-T wave changes, no acute change   Cardiac catheterization 10/2013 IMPRESSION:  Preserved global LV function the estimated ejection fraction of 55% and evidence for a small area of mild mid-distal inferior hypocontractility  No significant residual coronary obstructive disease with the appearance of a patent stent in the LAD, normal left circumflex artery, and patent tandem stance in the RCA extending from the ostium to the mid segment. There is faint collateralization of a small RV marginal branch which is supplied via septal collaterals arising from the LAD.  Moderate pulmonary hypertension following recent aggressive diuresis with a PA systolic pressure of 57 mm.  Troy Sine, MD, [facility] 10/06/2013  2-D echo 10/2013: Study Conclusions  - Left ventricle: The cavity size was normal. Systolic   function was normal. The estimated ejection fraction was   in the range of 55% to 60%. Wall motion was normal; there   were no regional wall motion abnormalities. Features are   consistent with a pseudonormal left ventricular filling   pattern, with concomitant abnormal relaxation and   increased filling pressure (grade 2 diastolic   dysfunction). - Mitral valve: Calcified annulus. Mild regurgitation. - Left atrium: The atrium was mildly dilated. -  Tricuspid valve: Moderate regurgitation. - Pulmonary arteries: Systolic pressure was moderately to   severely increased. PA peak pressure: 41mm Hg (S).

## 2014-03-31 NOTE — Telephone Encounter (Signed)
Appt scheduled

## 2014-04-01 ENCOUNTER — Other Ambulatory Visit (INDEPENDENT_AMBULATORY_CARE_PROVIDER_SITE_OTHER): Payer: Medicare HMO

## 2014-04-01 DIAGNOSIS — R7309 Other abnormal glucose: Secondary | ICD-10-CM

## 2014-04-01 DIAGNOSIS — I10 Essential (primary) hypertension: Secondary | ICD-10-CM

## 2014-04-01 LAB — HEMOGLOBIN A1C: Hgb A1c MFr Bld: 6.5 % (ref 4.6–6.5)

## 2014-04-01 LAB — BASIC METABOLIC PANEL
BUN: 21 mg/dL (ref 6–23)
CO2: 29 mEq/L (ref 19–32)
Calcium: 9.5 mg/dL (ref 8.4–10.5)
Chloride: 104 mEq/L (ref 96–112)
Creatinine, Ser: 1.1 mg/dL (ref 0.4–1.2)
GFR: 55.37 mL/min — ABNORMAL LOW (ref >60.00)
Glucose, Bld: 110 mg/dL — ABNORMAL HIGH (ref 70–99)
Potassium: 5.1 mEq/L (ref 3.5–5.1)
Sodium: 140 mEq/L (ref 135–145)

## 2014-04-02 ENCOUNTER — Ambulatory Visit: Payer: Medicare HMO | Admitting: Family

## 2014-04-03 ENCOUNTER — Ambulatory Visit (INDEPENDENT_AMBULATORY_CARE_PROVIDER_SITE_OTHER): Payer: Medicare HMO | Admitting: Internal Medicine

## 2014-04-03 ENCOUNTER — Encounter: Payer: Self-pay | Admitting: Internal Medicine

## 2014-04-03 VITALS — BP 146/80 | HR 58 | Temp 98.5°F | Wt 244.9 lb

## 2014-04-03 DIAGNOSIS — Z23 Encounter for immunization: Secondary | ICD-10-CM

## 2014-04-03 DIAGNOSIS — K760 Fatty (change of) liver, not elsewhere classified: Secondary | ICD-10-CM | POA: Insufficient documentation

## 2014-04-03 DIAGNOSIS — R05 Cough: Secondary | ICD-10-CM

## 2014-04-03 DIAGNOSIS — Z09 Encounter for follow-up examination after completed treatment for conditions other than malignant neoplasm: Secondary | ICD-10-CM

## 2014-04-03 DIAGNOSIS — R7309 Other abnormal glucose: Secondary | ICD-10-CM

## 2014-04-03 DIAGNOSIS — R7303 Prediabetes: Secondary | ICD-10-CM

## 2014-04-03 DIAGNOSIS — I270 Primary pulmonary hypertension: Secondary | ICD-10-CM

## 2014-04-03 DIAGNOSIS — I5032 Chronic diastolic (congestive) heart failure: Secondary | ICD-10-CM

## 2014-04-03 DIAGNOSIS — R053 Chronic cough: Secondary | ICD-10-CM

## 2014-04-03 DIAGNOSIS — I272 Pulmonary hypertension, unspecified: Secondary | ICD-10-CM

## 2014-04-03 DIAGNOSIS — I10 Essential (primary) hypertension: Secondary | ICD-10-CM

## 2014-04-03 NOTE — Progress Notes (Signed)
Pre visit review using our clinic review tool, if applicable. No additional management support is needed unless otherwise documented below in the visit note.  Chief Complaint  Patient presents with  . Hospitalization Follow-up    Seen for SOB and CHF.    HPI: Patient comes in today as follow up from hospitalization 9 19 to 9 22  For acute on chronic chf followed by cardiology earlier this week and felt to be stable . She taking meds and weigh there self   She has knonw cad tachy brady a fib with opacer and hx fo cva  And chf,  Followed by cardiology  Saw dr Lanny Hurst pa this week .  Feels problem was missing some of her bmeds and eating  Differently in the situation  She was noted to have  fatty  liver on  [place] and eleva alk phos  The only other non cardiac issue  She remains on cpap  alos come sin for fu of pre diabetes  Early diabetes situation   Trying to eat less sugar carbs.   ROS: See pertinent positives and negatives per HPI. Cough onset scratchy throat pre hosp and now  no change from rx for hospital  .  Has some nasal congstion pnd "sinus" no gerd no new sob and weight is stable on cpap  Past Medical History  Diagnosis Date  . History of CVA (cerebrovascular accident)   . CAD (coronary artery disease) 6195,0932    post PTCA with bare-metal stenting to mid RCA in December 2004     . Hypertension   . Chronic atrial fibrillation 06/2007    Tachybradycardia pacemaker  . Pacemaker   . OSA on CPAP   . Anticoagulant long-term use     pradaxa  . Transfusion history   . Hyperlipidemia   . Edema of lower extremity   . History of acute inferior wall MI   . Anxiety   . Diplopia 06/19/2008    Qualifier: Diagnosis of  By: [name] MD, [name]   . Unspecified hemorrhoids without mention of complication 6/71/2458    Colonoscopy--Dr. Carlean Purl   . Myocardial infarction   . Dysrhythmia     ATRIAL FIBRILATION  . TIA (transient ischemic attack)   . CHF (congestive heart failure)   .  Shortness of breath     Family History  Problem Relation Age of Onset  . Suicidality Father     suicide death pt was 3 yrs  . Arrhythmia Mother     heart arrthymia  . Hypertension Mother   . Heart attack Brother     died age 38  . Heart disease Paternal Aunt   . Prostate cancer Maternal Grandfather   . Diabetes Paternal Grandfather     fathers side of the family  . Diabetes Mother     History   Social History  . Marital Status: Married    Spouse Name: N/A    Number of Children: 0  . Years of Education: HS   Occupational History  . retired     previously worked [employer] [employer]  . Smoking status: Former Smoker -- 1.00 packs/day for 5 years    Types: Cigarettes    Start date: 05/11/1978    Quit date: 07/03/1982  . Smokeless tobacco: Never Used  . Alcohol Use: Yes     Comment: once q 6 months-socailly-wine  . Drug Use: No  . Sexual Activity: Not Currently   Other Topics  Concern  . 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   Married    Originally from [place] of two, high school education   Former smoker [phone] 1ppd   Hunting dogs 7    Retired from [employer] 2     Outpatient Encounter Prescriptions as of 04/03/2014  Medication Sig  . aspirin 81 MG tablet Take 81 mg by mouth daily.   [name] atorvastatin (LIPITOR) 40 MG tablet Take 1 tablet (40 mg total) by mouth every morning.  [name] b complex vitamins tablet Take 1 tablet by mouth daily.  . Cholecalciferol (VITAMIN D-3) 5000 UNITS TABS Take 5,000 Units by mouth every morning.  . dabigatran (PRADAXA) 150 MG CAPS capsule Take 150 mg by mouth 2 (two) times daily.  [name] diltiazem (CARDIZEM CD) 180 MG 24 hr capsule Take 1 capsule (180 mg total) by mouth daily.  [name] dofetilide (TIKOSYN) 250 MCG capsule Take 1 capsule (250 mcg total) by mouth 2 (two) times daily.  [name] FLUoxetine (PROZAC) 20 MG capsule TAKE ONE  CAPSULE BY MOUTH IN THE MORNING  . furosemide (LASIX) 40 MG tablet Take 1 tablet (40 mg total) by mouth 2 (two) times daily.  [name] ketoconazole (NIZORAL) 2 % cream   . losartan (COZAAR) 100 MG tablet Take 1 tablet (100 mg total) by mouth daily.  [name] Lysine HCl 1000 MG TABS Take 1 tablet by mouth daily as needed (for fever blisters).   . metoprolol succinate (TOPROL-XL) 100 MG 24 hr tablet Take with or immediately following a meal. Take 176m (1 tablet) in am, and 564m(1/2 tablet) in pm  . Multiple Vitamin (MULTIVITAMIN) tablet Take 1 tablet by mouth daily.    . nitroGLYCERIN (NITROSTAT) 0.4 MG SL tablet Place 1 tablet (0.4 mg total) under the tongue every 5 (five) minutes as needed.  . potassium chloride SA (K-DUR,KLOR-CON) 20 MEQ tablet Take 1 tablet (20 mEq total) by mouth 2 (two) times daily.  . vitamin B-12 (CYANOCOBALAMIN) 1000 MCG tablet Take 1,000 mcg by mouth daily.  . vitamin E 1000 UNIT capsule Take 1,000 Units by mouth daily.  . [DISCONTINUED] benzonatate (TESSALON) 100 MG capsule Take 1 capsule (100 mg total) by mouth 3 (three) times daily as needed for cough.  . [DISCONTINUED] diltiazem (DILACOR XR) 180 MG 24 hr capsule Take 1 capsule (180 mg total) by mouth daily.    EXAM:  BP 146/80  Pulse 58  Temp(Src) 98.5 F (36.9 C) (Oral)  Wt 244 lb 14.4 oz (111.086 kg)  SpO2 96%  Body mass index is 39.55 kg/(m^2).  GENERAL: vitals reviewed and listed above, alert, oriented, appears well hydrated and in no acute distress frequent throat clearing and dry cough no sob sitting speaking  Color nl  HEENT: atraumatic, conjunctiva  clear, no obvious abnormalities on inspection of external nose and ears  tms normal  Mild congestion face nontender OP : no lesion edema or exudate  NECK: no obvious masses on inspection palpation  LUNGS: clear to auscultation bilaterally, no wheezes, rales or rhonchi, good air movement CV: HRRR, no clubbing cyanosis or  peripheral edema nl cap refill  MS: moves all  extremities without noticeable focal  abnormality PSYCH: pleasant and cooperative, no obvious depression or anxiety Lab Results  Component Value Date   WBC 10.1 03/22/2014   HGB 12.7 03/22/2014   HCT 38.1 03/22/2014   PLT 277 03/22/2014  GLUCOSE 110* 04/01/2014   CHOL 136 11/19/2013   TRIG 117.0 11/19/2013   HDL 47.50 11/19/2013   LDLCALC 65 11/19/2013   ALT 38* 03/22/2014   AST 26 03/22/2014   NA 140 04/01/2014   K 5.1 04/01/2014   CL 104 04/01/2014   CREATININE 1.1 04/01/2014   BUN 21 04/01/2014   CO2 29 04/01/2014   TSH 3.530 03/22/2014   INR 1.30 10/06/2013   HGBA1C 6.5 04/01/2014   BP Readings from Last 3 Encounters:  04/03/14 146/80  03/30/14 114/76  03/24/14 130/58    ASSESSMENT AND PLAN:  Discussed the following assessment and plan:  Cough, persistent - pre dates hosp no change address as upper airway.  no acei  Hospital discharge follow-up  Pulmonary hypertension  Chronic diastolic heart failure- grade 2 diastolic dysfunction by echo 10/03/13  Fatty liver disease, nonalcoholic - [place]  mild transamintiis  Encounter for immunization  Pre-diabetes - better labs bg noted   Essential hypertension - check readigns to ensure control out of office    She has had 2 hospitalizations in last 6 months heart related  Feels can avoid striggers .  -Patient advised to return or notify health care team  if symptoms worsen ,persist or new concerns arise.  Patient Instructions  Check bp readings and make sure at goal lower than 140/90 on a regular basis. Close follow up with cardiology as advised.  Cough could be aggravated bu nasal drainage  Irritation to airway , No cough drops try sugar fee candy .  Add flonase   Or  nasacort 2 spray each nostril EVERY DAY.  Need to be on this for at least 2 weeks  To see if helps and if it does then continue this .    If cough not improving we may get pulmonary by  dr Gwenette Greet .   Blood sugar is much better.   Continue lifestyle intervention  healthy eating and exercise . ROV 3 months  hga1c pre visit .   [name]. [name] M.D.

## 2014-04-03 NOTE — Patient Instructions (Signed)
Check bp readings and make sure at goal lower than 140/90 on a regular basis. Close follow up with cardiology as advised.  Cough could be aggravated bu nasal drainage  Irritation to airway , No cough drops try sugar fee candy .  Add flonase   Or  nasacort 2 spray each nostril EVERY DAY.  Need to be on this for at least 2 weeks  To see if helps and if it does then continue this .    If cough not improving we may get pulmonary by  dr Gwenette Greet .   Blood sugar is much better.   Continue lifestyle intervention healthy eating and exercise . ROV 3 months  hga1c pre visit .

## 2014-04-03 NOTE — Assessment & Plan Note (Signed)
Better a1c 6.5 now follow

## 2014-04-08 LAB — HM MAMMOGRAPHY

## 2014-04-15 ENCOUNTER — Other Ambulatory Visit: Payer: Medicare HMO

## 2014-04-21 ENCOUNTER — Encounter: Payer: Self-pay | Admitting: Internal Medicine

## 2014-04-23 ENCOUNTER — Ambulatory Visit: Payer: Medicare HMO | Admitting: Internal Medicine

## 2014-05-06 ENCOUNTER — Encounter: Payer: Self-pay | Admitting: Internal Medicine

## 2014-05-13 ENCOUNTER — Encounter: Payer: Self-pay | Admitting: *Deleted

## 2014-06-04 ENCOUNTER — Encounter: Payer: Self-pay | Admitting: Cardiovascular Disease

## 2014-06-04 ENCOUNTER — Ambulatory Visit (INDEPENDENT_AMBULATORY_CARE_PROVIDER_SITE_OTHER): Payer: Medicare HMO | Admitting: Cardiovascular Disease

## 2014-06-04 VITALS — BP 110/84 | HR 61 | Ht 66.0 in | Wt 252.0 lb

## 2014-06-04 DIAGNOSIS — E785 Hyperlipidemia, unspecified: Secondary | ICD-10-CM

## 2014-06-04 DIAGNOSIS — I5032 Chronic diastolic (congestive) heart failure: Secondary | ICD-10-CM

## 2014-06-04 DIAGNOSIS — I480 Paroxysmal atrial fibrillation: Secondary | ICD-10-CM

## 2014-06-04 DIAGNOSIS — I10 Essential (primary) hypertension: Secondary | ICD-10-CM

## 2014-06-04 NOTE — Assessment & Plan Note (Signed)
She remains in NSR. Continue tikosyn and pradaxa

## 2014-06-04 NOTE — Assessment & Plan Note (Signed)
BP is well controlled 

## 2014-06-04 NOTE — Assessment & Plan Note (Signed)
[name] is seen back for her chronic diastolic CHF.  She is avoiding salt. We discussed the importance of diet and exercise and weight loss. Continue meds.

## 2014-06-04 NOTE — Progress Notes (Signed)
[name] Date of Birth  02/09/1946 [facility] [address]    [name] [place], [place]  [address] [phone]  Fax  [phone]   Problem list: 1. Atrial fibrillation 2. Status post pacemaker implantation 3. Coronary artery disease-status post PTCA and stenting of her right coronary artery 4. Hyperlipidemia 5. Hypertension 6. Obstructive sleep apnea-currently on CPAP  History of Present Illness:  [name] is a 68 year old female with a history of atrial fibrillation. She status post pacemaker implantation. She also has a history of coronary artery disease and is status post PTCA and stenting of the right coronary artery. She also has a history of hyperlipidemia, hypertension, and sleep apnea.  She uses a CPAP mask at night.  The CPAP mask has really helped her stay in normal sinus rhythm.  She complains of having some episodes of chest tightness. This occurs at times. One episode was relieved with sublingual nitroglycerin. She states that it is not nearly as severe as her previous episodes of angina.  January 13, 21014: She was hospitalized in November with severe shortness breath and "double pneumonia". She is slowly recovering.  She's looking forward to  getting back into her exercise program.  She has not been exercising at all.  She's had some leg edema.  She denies any chest pain or shortness breath.  She's has mild leg swelling for the past several months. She does not add salt any of her food. She tries to stay away from salty foods and processed meat.  September 18, 2013:  She remains short of breath  - especialy with exertion  Echo in Jan. 2014:   - Left ventricle: The cavity size was normal. Wall thickness was increased in a pattern of mild LVH. Systolic function was normal. The estimated ejection fraction was in the range of 60% to 65%. - Pulmonary arteries: PA peak pressure: 70mm Hg   October 03, 2013:  Level was seen about 2 weeks ago with some increasing  shortness of breath. She was noted to have moderate pulmonary hypertension on echo. She started on diltiazem. She has fairly well preserved left ventricle systolic function.  We started her on Cardizem XL 180 mg a day. She has not done well since that medication in addition. She's more short of breath. She's gained about 5 pounds.  She presents today for her echocardiogram. She is very short of breath and was worked into the schedule. Her O2 saturations at rest were 86%. We placed  oxygen on her and her O2 saturations increased above 90%. She is clearly more short of breath after the addition of the diltiazem.   She has not been eating any extra salt .  Has been taking her lasix.   She denies any chest pain - specifically was asked about pleuretic CP.  Had orthopnea until we placed the oxygen on her.   10/20/2013: [name] was admitted to the hospital with severe shortness breath when I saw her several weeks ago. She was found to be volume overload. She was aggressively diuresed and lost about 10 pounds of weight. Cardiac catheterization following that diuresis revealed only minimal coronary artery irregularities. Her left ventricular systolic function was normal. Her PA pressures were moderately elevated with an estimated PA pressure of around 57.   She was discharged on the same dose of Lasix that she was on at admission.   She is avoiding salt.    Dec. 3, 2015:  [name] is a 68 yo followed for chronic  diastolic CHf and pulmonary hypertension ( est. PA pressure of 70)  [name] has been back in the hospital since I last saw her.  She has  Increased her lasix which has helped.  She does a pretty good job of avoiding salt.      Current Outpatient Prescriptions on File Prior to Visit  Medication Sig Dispense Refill  . aspirin 81 MG tablet Take 81 mg by mouth daily.     [name] atorvastatin (LIPITOR) 40 MG tablet Take 1 tablet (40 mg total) by mouth every morning. 90 tablet 3  . b complex vitamins tablet  Take 1 tablet by mouth daily.    . Cholecalciferol (VITAMIN D-3) 5000 UNITS TABS Take 5,000 Units by mouth every morning.    . dabigatran (PRADAXA) 150 MG CAPS capsule Take 150 mg by mouth 2 (two) times daily.    [name] diltiazem (CARDIZEM CD) 180 MG 24 hr capsule Take 1 capsule (180 mg total) by mouth daily. 90 capsule 3  . dofetilide (TIKOSYN) 250 MCG capsule Take 1 capsule (250 mcg total) by mouth 2 (two) times daily. 180 capsule 3  . FLUoxetine (PROZAC) 20 MG capsule TAKE ONE CAPSULE BY MOUTH IN THE MORNING 90 capsule 2  . furosemide (LASIX) 40 MG tablet Take 1 tablet (40 mg total) by mouth 2 (two) times daily. 180 tablet 3  . ketoconazole (NIZORAL) 2 % cream     . losartan (COZAAR) 100 MG tablet Take 1 tablet (100 mg total) by mouth daily. 90 tablet 3  . Lysine HCl 1000 MG TABS Take 1 tablet by mouth daily as needed (for fever blisters).     . metoprolol succinate (TOPROL-XL) 100 MG 24 hr tablet Take with or immediately following a meal. Take 100mg  (1 tablet) in am, and 50mg  (1/2 tablet) in pm 150 tablet 3  . Multiple Vitamin (MULTIVITAMIN) tablet Take 1 tablet by mouth daily.      . nitroGLYCERIN (NITROSTAT) 0.4 MG SL tablet Place 1 tablet (0.4 mg total) under the tongue every 5 (five) minutes as needed. 25 tablet 5  . potassium chloride SA (K-DUR,KLOR-CON) 20 MEQ tablet Take 1 tablet (20 mEq total) by mouth 2 (two) times daily. 180 tablet 3  . vitamin B-12 (CYANOCOBALAMIN) 1000 MCG tablet Take 1,000 mcg by mouth daily.    . vitamin E 1000 UNIT capsule Take 1,000 Units by mouth daily.     No current facility-administered medications on file prior to visit.    No Known Allergies  Past Medical History  Diagnosis Date  . History of CVA (cerebrovascular accident)   . CAD (coronary artery disease) [id number]    post PTCA with bare-metal stenting to mid RCA in December 2004     . Hypertension   . Chronic atrial fibrillation 06/2007    Tachybradycardia pacemaker  . Pacemaker   . OSA on CPAP    . Anticoagulant long-term use     pradaxa  . Transfusion history   . Hyperlipidemia   . Edema of lower extremity   . History of acute inferior wall MI   . Anxiety   . Diplopia 06/19/2008    Qualifier: Diagnosis of  By: [name] MD, [name]   . Unspecified hemorrhoids without mention of complication [id number]    Colonoscopy--Dr. Carlean Purl   . Myocardial infarction   . Dysrhythmia     ATRIAL FIBRILATION  . TIA (transient ischemic attack)   . CHF (congestive heart failure)   . Shortness of breath  Past Surgical History  Procedure Laterality Date  . Total abdominal hysterectomy  1984    2 surgeries endometriosis bso  . Permanent pacemaker  06/2007  . Back surgeries      x2-3 by Dr Trenton Gammon  . Coronary stent placement      C stent  . Cholecystectomy  1984  . Coronary angioplasty with stent placement  1998  . Doppler echocardiography  2009  . Back surgery      History  Smoking status  . Former Smoker -- 1.00 packs/day for 5 years  . Types: Cigarettes  . Start date: 05/11/1978  . Quit date: 07/03/1982  Smokeless tobacco  . Never Used    History  Alcohol Use  . Yes    Comment: once q 6 months-socailly-wine    Family History  Problem Relation Age of Onset  . Suicidality Father     suicide death pt was 3 yrs  . Arrhythmia Mother     heart arrthymia  . Hypertension Mother   . Heart attack Brother     died age 2  . Heart disease Paternal Aunt   . Prostate cancer Maternal Grandfather   . Diabetes Paternal Grandfather     fathers side of the family  . Diabetes Mother     Reviw of Systems:  Reviewed in the HPI.  All other systems are negative.  Physical Exam: BP 110/84 mmHg  Pulse 61  Ht 5\' 6"  (1.676 m)  Wt 252 lb (114.306 kg)  BMI 40.69 kg/m2  SpO2 94% The patient is alert and oriented x 3.     HEENT:   She has normal carotids.  no JVD,   Her mucous membranes are moist.  Lungs: Lung exam is clear.   Heart: Regular rate S1-S2.  No S3 heard  Abdomen:  Her abdomen is soft. She has good bowel sounds.  Extremities:  She has trace - 1+  ankle edema.  Neuro:  Her gait is normal. Her neuro exam is nonfocal.    ECG:   Assessment / Plan:

## 2014-06-04 NOTE — Patient Instructions (Signed)
Your physician recommends that you continue on your current medications as directed. Please refer to the Current Medication list given to you today.  Your physician wants you to follow-up in: 6 months with Dr. Nahser.  You will receive a reminder letter in the mail two months in advance. If you don't receive a letter, please call our office to schedule the follow-up appointment. Your physician recommends that you return for lab work in: 6 months on the day of or a few days before your office visit with Dr. Nahser.  You will need to FAST for this appointment - nothing to eat or drink after midnight the night before except water.   

## 2014-06-11 ENCOUNTER — Encounter (HOSPITAL_COMMUNITY): Payer: Self-pay | Admitting: Cardiovascular Disease

## 2014-06-24 DIAGNOSIS — I495 Sick sinus syndrome: Secondary | ICD-10-CM

## 2014-07-06 ENCOUNTER — Ambulatory Visit: Payer: Medicare HMO | Admitting: Internal Medicine

## 2014-07-06 ENCOUNTER — Other Ambulatory Visit (INDEPENDENT_AMBULATORY_CARE_PROVIDER_SITE_OTHER): Payer: PPO

## 2014-07-06 ENCOUNTER — Other Ambulatory Visit: Payer: Medicare HMO

## 2014-07-06 DIAGNOSIS — R7309 Other abnormal glucose: Secondary | ICD-10-CM

## 2014-07-06 DIAGNOSIS — I10 Essential (primary) hypertension: Secondary | ICD-10-CM

## 2014-07-06 LAB — HEMOGLOBIN A1C: Hgb A1c MFr Bld: 6.9 % — ABNORMAL HIGH (ref 4.6–6.5)

## 2014-07-06 LAB — BASIC METABOLIC PANEL
BUN: 24 mg/dL — ABNORMAL HIGH (ref 6–23)
CO2: 29 mEq/L (ref 19–32)
Calcium: 9.4 mg/dL (ref 8.4–10.5)
Chloride: 103 mEq/L (ref 96–112)
Creatinine, Ser: 1 mg/dL (ref 0.4–1.2)
GFR: 58.53 mL/min — ABNORMAL LOW (ref >60.00)
Glucose, Bld: 121 mg/dL — ABNORMAL HIGH (ref 70–99)
Potassium: 4.8 mEq/L (ref 3.5–5.1)
Sodium: 141 mEq/L (ref 135–145)

## 2014-07-07 ENCOUNTER — Ambulatory Visit (INDEPENDENT_AMBULATORY_CARE_PROVIDER_SITE_OTHER): Payer: PPO | Admitting: Pulmonary Disease

## 2014-07-07 ENCOUNTER — Encounter: Payer: Self-pay | Admitting: Pulmonary Disease

## 2014-07-07 VITALS — BP 124/74 | HR 69 | Temp 98.4°F | Ht 66.5 in | Wt 252.8 lb

## 2014-07-07 DIAGNOSIS — G4733 Obstructive sleep apnea (adult) (pediatric): Secondary | ICD-10-CM

## 2014-07-07 NOTE — Patient Instructions (Signed)
Continue on cpap, and will get you referred to a new home care company Work on weight loss followup with me again in one year.

## 2014-07-07 NOTE — Assessment & Plan Note (Signed)
The patient is wearing C Pap compliantly, and continues to do very well with the device. She is having no issues with her mask fit or pressure, but would like to change to a different home care company. I have asked her to continue with C Pap, keep up with her mask changes and supplies, and to work aggressively on weight loss. I'll see her back in one year if she is doing well.

## 2014-07-07 NOTE — Progress Notes (Signed)
   Subjective:    Patient ID: [name], female    DOB: [birth year], 69 y.o.   MRN: [id number]  HPI The patient comes in today for follow-up of her obstructive sleep apnea. She is wearing C Pap compliantly, and is having no issues with her mask fit or pressure. She feels that she sleeps well with the device, and is satisfied with her daytime alertness. Her only complaint today is related to her poor service from her current home care company. She would like to change to a different company.   Review of Systems  Constitutional: Negative for fever and unexpected weight change.  HENT: Negative for congestion, dental problem, ear pain, nosebleeds, postnasal drip, rhinorrhea, sinus pressure, sneezing, sore throat and trouble swallowing.   Eyes: Negative for redness and itching.  Respiratory: Negative for cough, chest tightness, shortness of breath and wheezing.   Cardiovascular: Negative for palpitations and leg swelling.  Gastrointestinal: Negative for nausea and vomiting.  Genitourinary: Negative for dysuria.  Musculoskeletal: Negative for joint swelling.  Skin: Negative for rash.  Neurological: Negative for headaches.  Hematological: Does not bruise/bleed easily.  Psychiatric/Behavioral: Negative for dysphoric mood. The patient is not nervous/anxious.        Objective:   Physical Exam Obese female in no acute distress Nose without purulence or discharge noted No skin breakdown or pressure necrosis from the C Pap mask Neck without lymphadenopathy or thyromegaly Lower extremities with mild edema, no cyanosis Alert and oriented, moves all 4 extremities.       Assessment & Plan:

## 2014-07-13 ENCOUNTER — Encounter: Payer: Self-pay | Admitting: Internal Medicine

## 2014-07-13 ENCOUNTER — Ambulatory Visit (INDEPENDENT_AMBULATORY_CARE_PROVIDER_SITE_OTHER): Payer: PPO | Admitting: Internal Medicine

## 2014-07-13 VITALS — BP 134/84 | HR 66 | Temp 99.6°F | Wt 253.0 lb

## 2014-07-13 DIAGNOSIS — R7309 Other abnormal glucose: Secondary | ICD-10-CM

## 2014-07-13 DIAGNOSIS — R7303 Prediabetes: Secondary | ICD-10-CM

## 2014-07-13 DIAGNOSIS — R05 Cough: Secondary | ICD-10-CM

## 2014-07-13 DIAGNOSIS — I10 Essential (primary) hypertension: Secondary | ICD-10-CM

## 2014-07-13 DIAGNOSIS — R053 Chronic cough: Secondary | ICD-10-CM

## 2014-07-13 DIAGNOSIS — G4733 Obstructive sleep apnea (adult) (pediatric): Secondary | ICD-10-CM

## 2014-07-13 NOTE — Progress Notes (Signed)
Chief Complaint  Patient presents with  . Follow-up    medications  bp  . Diabetes    HPI: [name] 69 y.o.    With cv hx including 1. Atrial fibrillation PAF2. Status post pacemaker implantation3. Coronary artery disease-status post PTCA and stenting of her right coronary artery4. Hyperlipidemia5. Hypertension 6. Obstructive sleep apnea-currently on CPAP helped the  AF  She had acute diastolic hf at some point but doing well now.  Cough is better  after cold  Doing well no cp sob  Swelling minimal on lasix  q d  Had some excess sugars obver holidays but back to better  No vision changes unus infection  Diabetic neuropathy  Is on anticoagulation for af and tylosin ROS: See pertinent positives and negatives per HPI.  Past Medical History  Diagnosis Date  . History of CVA (cerebrovascular accident)   . CAD (coronary artery disease) [id number]    post PTCA with bare-metal stenting to mid RCA in December 2004     . Hypertension   . Chronic atrial fibrillation 06/2007    Tachybradycardia pacemaker  . Pacemaker   . OSA on CPAP   . Anticoagulant long-term use     pradaxa  . Transfusion history   . Hyperlipidemia   . Edema of lower extremity   . History of acute inferior wall MI   . Anxiety   . Diplopia 06/19/2008    Qualifier: Diagnosis of  By: [name] MD, [name]   . Unspecified hemorrhoids without mention of complication [id number]    Colonoscopy--Dr. Carlean Purl   . Myocardial infarction   . Dysrhythmia     ATRIAL FIBRILATION  . TIA (transient ischemic attack)   . CHF (congestive heart failure)   . Shortness of breath     Family History  Problem Relation Age of Onset  . Suicidality Father     suicide death pt was 3 yrs  . Arrhythmia Mother     heart arrthymia  . Hypertension Mother   . Heart attack Brother     died age 96  . Heart disease Paternal Aunt   . Prostate cancer Maternal Grandfather   . Diabetes Paternal Grandfather     fathers side of the family  .  Diabetes Mother     History   Social History  . Marital Status: Married    Spouse Name: N/A    Number of Children: 0  . Years of Education: HS   Occupational History  . retired     previously worked [employer]  . Smoking status: Former Smoker -- 1.00 packs/day for 5 years    Types: Cigarettes    Start date: 05/11/1978    Quit date: 07/03/1982  . Smokeless tobacco: Never Used  . Alcohol Use: Yes     Comment: once q 6 months-socailly-wine  . Drug Use: No  . Sexual Activity: Not Currently   Other Topics Concern  . 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   Married    Originally from [place] of two, high school education   Former smoker [phone] 1ppd   Hunting dogs 7    Retired from [employer] 2     Outpatient Encounter Prescriptions as of 07/13/2014  Medication Sig  . aspirin 81 MG tablet Take 81 mg by mouth daily.   [name]  atorvastatin (LIPITOR) 40 MG tablet Take 1 tablet (40 mg total) by mouth every morning.  [name] b complex vitamins tablet Take 1 tablet by mouth daily.  . Cholecalciferol (VITAMIN D-3) 5000 UNITS TABS Take 5,000 Units by mouth every morning.  . dabigatran (PRADAXA) 150 MG CAPS capsule Take 150 mg by mouth 2 (two) times daily.  [name] diltiazem (CARDIZEM CD) 180 MG 24 hr capsule Take 1 capsule (180 mg total) by mouth daily.  [name] dofetilide (TIKOSYN) 250 MCG capsule Take 1 capsule (250 mcg total) by mouth 2 (two) times daily.  [name] FLUoxetine (PROZAC) 20 MG capsule TAKE ONE CAPSULE BY MOUTH IN THE MORNING  . furosemide (LASIX) 40 MG tablet Take 1 tablet (40 mg total) by mouth 2 (two) times daily.  [name] ketoconazole (NIZORAL) 2 % cream daily as needed.   [name] losartan (COZAAR) 100 MG tablet Take 1 tablet (100 mg total) by mouth daily.  [name] Lysine HCl 1000 MG TABS Take 1 tablet by mouth daily as needed (for fever blisters).   . metoprolol succinate  (TOPROL-XL) 100 MG 24 hr tablet Take with or immediately following a meal. Take 100mg  (1 tablet) in am, and 50mg  (1/2 tablet) in pm  . Multiple Vitamin (MULTIVITAMIN) tablet Take 1 tablet by mouth daily.    . nitroGLYCERIN (NITROSTAT) 0.4 MG SL tablet Place 1 tablet (0.4 mg total) under the tongue every 5 (five) minutes as needed.  . potassium chloride SA (K-DUR,KLOR-CON) 20 MEQ tablet Take 1 tablet (20 mEq total) by mouth 2 (two) times daily.  . vitamin B-12 (CYANOCOBALAMIN) 1000 MCG tablet Take 1,000 mcg by mouth daily.  . vitamin E 1000 UNIT capsule Take 1,000 Units by mouth daily.    EXAM:  BP 134/84 mmHg  Pulse 66  Temp(Src) 99.6 F (37.6 C) (Oral)  Wt 253 lb (114.76 kg)  SpO2 95%  Body mass index is 40.23 kg/(m^2).  GENERAL: vitals reviewed and listed above, alert, oriented, appears well hydrated and in no acute distress HEENT: atraumatic, conjunctiva  clear, no obvious abnormalities on inspection of external nose and ears NECK: no obvious masses on inspection palpation  LUNGS: clear to auscultation bilaterally, no wheezes, rales or rhonchi,  CV: HRIRR, no clubbing cyanosis slight   peripheral edema nl cap refill  MS: moves all extremities without noticeable focal  abnormality PSYCH: pleasant and cooperative, no obvious depression or anxiety Lab Results  Component Value Date   WBC 10.1 03/22/2014   HGB 12.7 03/22/2014   HCT 38.1 03/22/2014   PLT 277 03/22/2014   GLUCOSE 121* 07/06/2014   CHOL 136 11/19/2013   TRIG 117.0 11/19/2013   HDL 47.50 11/19/2013   LDLCALC 65 11/19/2013   ALT 38* 03/22/2014   AST 26 03/22/2014   NA 141 07/06/2014   K 4.8 07/06/2014   CL 103 07/06/2014   CREATININE 1.0 07/06/2014   BUN 24* 07/06/2014   CO2 29 07/06/2014   TSH 3.530 03/22/2014   INR 1.30 10/06/2013   HGBA1C 6.9* 07/06/2014    ASSESSMENT AND PLAN:  Discussed the following assessment and plan:  Pre-diabetes - now in the diabetes range  again  lsi ? if cautious trial of  metformin  benefit  to  risk vs other med class doing well today  Cough, persistent - better   Essential hypertension  Obstructive sleep apnea now in diabetes range metformin  ? Risk vs benefit from  CHF hx  . Consider other meds ? If appropriate  Intensify lifestyle interventions.  -  Patient advised to return or notify health care team  if symptoms worsen ,persist or new concerns arise.  Patient Instructions  Intensify lifestyle interventions. To avoid sugars  .   Will consider  Low dose metformin    Caution with hx of heart failure.  But will  [place] your cardiologist about this . If not there are other options that don't cause weight gain . Glad you are doing well at this time .  Lab in 3 months and then rov      [place]. Zohal Reny M.D.  Pre visit review using our clinic review tool, if applicable. No additional management support is needed unless otherwise documented below in the visit note.

## 2014-07-13 NOTE — Patient Instructions (Signed)
Intensify lifestyle interventions. To avoid sugars  .   Will consider  Low dose metformin    Caution with hx of heart failure.  But will  [place] your cardiologist about this . If not there are other options that don't cause weight gain . Glad you are doing well at this time .  Lab in 3 months and then rov

## 2014-08-11 ENCOUNTER — Ambulatory Visit (INDEPENDENT_AMBULATORY_CARE_PROVIDER_SITE_OTHER): Payer: PPO | Admitting: Internal Medicine

## 2014-08-11 ENCOUNTER — Encounter: Payer: Self-pay | Admitting: Internal Medicine

## 2014-08-11 VITALS — BP 112/68 | HR 66 | Ht 66.5 in | Wt 257.6 lb

## 2014-08-11 DIAGNOSIS — I495 Sick sinus syndrome: Secondary | ICD-10-CM | POA: Diagnosis not present

## 2014-08-11 DIAGNOSIS — I10 Essential (primary) hypertension: Secondary | ICD-10-CM

## 2014-08-11 DIAGNOSIS — I5031 Acute diastolic (congestive) heart failure: Secondary | ICD-10-CM

## 2014-08-11 DIAGNOSIS — I480 Paroxysmal atrial fibrillation: Secondary | ICD-10-CM

## 2014-08-11 DIAGNOSIS — Z950 Presence of cardiac pacemaker: Secondary | ICD-10-CM

## 2014-08-11 NOTE — Assessment & Plan Note (Signed)
Her symptoms are class 2A. She will continue her current meds. She is trying to maintain a low sodium diet.

## 2014-08-11 NOTE — Assessment & Plan Note (Signed)
Her blood pressure today is well controlled. No change in meds. She is encouraged to lose weight.

## 2014-08-11 NOTE — Assessment & Plan Note (Signed)
Her [employer] DDD PM is working normally. Will recheck in several months.  

## 2014-08-11 NOTE — Assessment & Plan Note (Signed)
She has no anginal symptoms despite starting back exercising. Will follow.

## 2014-08-11 NOTE — Assessment & Plan Note (Signed)
She is maintaining NSR 99.9% of the time. She will continue her current meds.

## 2014-08-11 NOTE — Patient Instructions (Addendum)
Follow up with [facility] in 3 months for a telephone pacemaker check.  Your physician recommends that you schedule a follow-up appointment in: 6 months with [facility] and in 12 months with Dr.Taylor  Your physician wants you to follow-up in: 6 months with [place] will receive a reminder letter in the mail two months in advance. If you don't receive a letter, please call our office to schedule the follow-up appointment. Your physician wants you to follow-up in: 12 months with Dr. Lovena Le.  You will receive a reminder letter in the mail two months in advance. If you don't receive a letter, please call our office to schedule the follow-up appointment. Your physician recommends that you continue on your current medications as directed. Please refer to the Current Medication list given to you today.

## 2014-08-11 NOTE — Progress Notes (Signed)
HPI [name] returns today for followup. She is a very pleasant 69 year old woman with coronary artery disease, stroke, paroxysmal atrial fibrillation, symptomatic tachycardia bradycardia syndrome, status post permanent pacemaker insertion. With a combination of CPAP and dofetilide, she is maintaining sinus rhythm very nicely. She denies palpitations. No chest pain or shortness of breath. She has started exercising 3 times a week. No palpitations. No Known Allergies   Current Outpatient Prescriptions  Medication Sig Dispense Refill  . aspirin 81 MG tablet Take 81 mg by mouth daily.     [name] atorvastatin (LIPITOR) 40 MG tablet Take 1 tablet (40 mg total) by mouth every morning. 90 tablet 3  . b complex vitamins tablet Take 1 tablet by mouth daily.    . Cholecalciferol (VITAMIN D-3) 5000 UNITS TABS Take 5,000 Units by mouth every morning.    . dabigatran (PRADAXA) 150 MG CAPS capsule Take 150 mg by mouth 2 (two) times daily.    [name] diltiazem (CARDIZEM CD) 180 MG 24 hr capsule Take 1 capsule (180 mg total) by mouth daily. 90 capsule 3  . dofetilide (TIKOSYN) 250 MCG capsule Take 1 capsule (250 mcg total) by mouth 2 (two) times daily. 180 capsule 3  . FLUoxetine (PROZAC) 20 MG capsule TAKE ONE CAPSULE BY MOUTH IN THE MORNING 90 capsule 2  . furosemide (LASIX) 40 MG tablet Take 1 tablet (40 mg total) by mouth 2 (two) times daily. 180 tablet 3  . ketoconazole (NIZORAL) 2 % cream Apply 1 application topically daily as needed ("PT stated this is for chaffing").     . losartan (COZAAR) 100 MG tablet Take 1 tablet (100 mg total) by mouth daily. 90 tablet 3  . Lysine HCl 1000 MG TABS Take 1 tablet by mouth daily as needed (for fever blisters).     . metoprolol succinate (TOPROL-XL) 100 MG 24 hr tablet Take with or immediately following a meal. Take 100mg  (1 tablet) in am, and 50mg  (1/2 tablet) in pm 150 tablet 3  . Multiple Vitamin (MULTIVITAMIN) tablet Take 1 tablet by mouth daily.      . nitroGLYCERIN  (NITROSTAT) 0.4 MG SL tablet Place 1 tablet (0.4 mg total) under the tongue every 5 (five) minutes as needed. 25 tablet 5  . potassium chloride SA (K-DUR,KLOR-CON) 20 MEQ tablet Take 1 tablet (20 mEq total) by mouth 2 (two) times daily. 180 tablet 3  . vitamin B-12 (CYANOCOBALAMIN) 1000 MCG tablet Take 1,000 mcg by mouth daily.    . vitamin E 1000 UNIT capsule Take 1,000 Units by mouth daily.     No current facility-administered medications for this visit.     Past Medical History  Diagnosis Date  . History of CVA (cerebrovascular accident)   . CAD (coronary artery disease) [id number]    post PTCA with bare-metal stenting to mid RCA in December 2004     . Hypertension   . Chronic atrial fibrillation 06/2007    Tachybradycardia pacemaker  . Pacemaker   . OSA on CPAP   . Anticoagulant long-term use     pradaxa  . Transfusion history   . Hyperlipidemia   . Edema of lower extremity   . History of acute inferior wall MI   . Anxiety   . Diplopia 06/19/2008    Qualifier: Diagnosis of  By: [name] MD, [name]   . Unspecified hemorrhoids without mention of complication Q000111Q    Colonoscopy--Dr. Carlean Purl   . Myocardial infarction   . Dysrhythmia     ATRIAL  FIBRILATION  . TIA (transient ischemic attack)   . CHF (congestive heart failure)   . Shortness of breath     ROS:   All systems reviewed and negative except as noted in the HPI.   Past Surgical History  Procedure Laterality Date  . Total abdominal hysterectomy  1984    2 surgeries endometriosis bso  . Permanent pacemaker  06/2007  . Back surgeries      x2-3 by Dr Trenton Gammon  . Coronary stent placement      C stent  . Cholecystectomy  1984  . Coronary angioplasty with stent placement  1998  . Doppler echocardiography  2009  . Back surgery    . Left and right heart catheterization with coronary angiogram N/A 10/06/2013    Procedure: LEFT AND RIGHT HEART CATHETERIZATION WITH CORONARY ANGIOGRAM;  Surgeon: Troy Sine, MD;   Location: [facility] CATH LAB;  Service: Cardiovascular;  Laterality: N/A;     Family History  Problem Relation Age of Onset  . Suicidality Father     suicide death pt was 3 yrs  . Arrhythmia Mother     heart arrthymia  . Hypertension Mother   . Heart attack Brother     died age 43  . Heart disease Paternal Aunt   . Prostate cancer Maternal Grandfather   . Diabetes Paternal Grandfather     fathers side of the family  . Diabetes Mother      History   Social History  . Marital Status: Married    Spouse Name: N/A    Number of Children: 0  . Years of Education: HS   Occupational History  . retired     previously worked [employer]  . Smoking status: Former Smoker -- 1.00 packs/day for 5 years    Types: Cigarettes    Start date: 05/11/1978    Quit date: 07/03/1982  . Smokeless tobacco: Never Used  . Alcohol Use: Yes     Comment: once q 6 months-socailly-wine  . Drug Use: No  . Sexual Activity: Not Currently   Other Topics Concern  . 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   Married    Originally from [employer] of two, high school education   Former smoker [phone] 1ppd   Hunting dogs 7    Retired from [employer] 2      BP 112/68 mmHg  Pulse 66  Ht 5' 6.5" (1.689 m)  Wt 257 lb 9.6 oz (116.847 kg)  BMI 40.96 kg/m2  Physical Exam:  Obese appearing 69 yo woman, NAD HEENT: Unremarkable Neck:  7 cm JVD, no thyromegally Lungs:  Clear with no wheezes, rales, or rhonchi. HEART:  Regular rate rhythm, no murmurs, no rubs, no clicks Abd:  soft, positive bowel sounds, no organomegally, no rebound, no guarding Ext:  2 plus pulses, no edema, no cyanosis, no clubbing Skin:  No rashes no nodules Neuro:  CN II through XII intact, motor grossly intact  DEVICE  Normal device function.  See PaceArt for details.   Assess/Plan:

## 2014-08-13 LAB — MDC_IDC_ENUM_SESS_TYPE_INCLINIC
Battery Voltage: 2.79 V
Brady Statistic RA Percent Paced: 1.5 %
Brady Statistic RV Percent Paced: 1 % — CL
Implantable Pulse Generator Model: 5826
Implantable Pulse Generator Serial Number: 1999089
Lead Channel Impedance Value: 400 Ohm
Lead Channel Impedance Value: 463 Ohm
Lead Channel Pacing Threshold Amplitude: 0.5 V
Lead Channel Pacing Threshold Amplitude: 1.5 V
Lead Channel Pacing Threshold Pulse Width: 0.4 ms
Lead Channel Pacing Threshold Pulse Width: 0.4 ms
Lead Channel Sensing Intrinsic Amplitude: 0.7 mV
Lead Channel Sensing Intrinsic Amplitude: 12 mV
Lead Channel Setting Pacing Amplitude: 2 V
Lead Channel Setting Pacing Amplitude: 3 V
Lead Channel Setting Pacing Pulse Width: 0.4 ms
Lead Channel Setting Sensing Sensitivity: 2 mV

## 2014-08-18 ENCOUNTER — Encounter: Payer: Self-pay | Admitting: Internal Medicine

## 2014-09-09 ENCOUNTER — Other Ambulatory Visit: Payer: Self-pay | Admitting: Internal Medicine

## 2014-09-09 ENCOUNTER — Other Ambulatory Visit: Payer: Self-pay | Admitting: Cardiovascular Disease

## 2014-09-09 NOTE — Telephone Encounter (Signed)
Patient has an upcoming appt.  Sent to the pharmacy by e-scribe.

## 2014-09-17 ENCOUNTER — Encounter: Payer: Self-pay | Admitting: Internal Medicine

## 2014-09-29 ENCOUNTER — Other Ambulatory Visit: Payer: Self-pay | Admitting: Cardiovascular Disease

## 2014-10-05 ENCOUNTER — Other Ambulatory Visit (INDEPENDENT_AMBULATORY_CARE_PROVIDER_SITE_OTHER): Payer: PPO

## 2014-10-05 DIAGNOSIS — R739 Hyperglycemia, unspecified: Secondary | ICD-10-CM

## 2014-10-05 LAB — BASIC METABOLIC PANEL
BUN: 23 mg/dL (ref 6–23)
CO2: 26 mEq/L (ref 19–32)
Calcium: 9.5 mg/dL (ref 8.4–10.5)
Chloride: 102 mEq/L (ref 96–112)
Creatinine, Ser: 1.02 mg/dL (ref 0.40–1.20)
GFR: 57.17 mL/min — ABNORMAL LOW (ref >60.00)
Glucose, Bld: 122 mg/dL — ABNORMAL HIGH (ref 70–99)
Potassium: 3.8 mEq/L (ref 3.5–5.1)
Sodium: 138 mEq/L (ref 135–145)

## 2014-10-05 LAB — HEMOGLOBIN A1C: Hgb A1c MFr Bld: 6.6 % — ABNORMAL HIGH (ref 4.6–6.5)

## 2014-10-12 ENCOUNTER — Encounter: Payer: Self-pay | Admitting: Internal Medicine

## 2014-10-12 ENCOUNTER — Ambulatory Visit (INDEPENDENT_AMBULATORY_CARE_PROVIDER_SITE_OTHER): Payer: PPO | Admitting: Internal Medicine

## 2014-10-12 VITALS — BP 134/92 | Temp 98.3°F | Ht 66.25 in | Wt 261.0 lb

## 2014-10-12 DIAGNOSIS — R7303 Prediabetes: Secondary | ICD-10-CM

## 2014-10-12 DIAGNOSIS — R7309 Other abnormal glucose: Secondary | ICD-10-CM

## 2014-10-12 DIAGNOSIS — I5032 Chronic diastolic (congestive) heart failure: Secondary | ICD-10-CM

## 2014-10-12 DIAGNOSIS — R350 Frequency of micturition: Secondary | ICD-10-CM

## 2014-10-12 DIAGNOSIS — I10 Essential (primary) hypertension: Secondary | ICD-10-CM | POA: Diagnosis not present

## 2014-10-12 NOTE — Patient Instructions (Signed)
Blood sugars are some better . But would like you to lose weight  To help knee, heart, and diabetes.. Consider seeing dietician again  " how to eat out healthy "  Tracking  Intake .   1/2 portions .  If sugars are worseing we can add low dose metformin.   Will do jury  Note .  ROV   With hg a1c and bmp in 4 months or as needed

## 2014-10-12 NOTE — Progress Notes (Signed)
Pre visit review using our clinic review tool, if applicable. No additional management support is needed unless otherwise documented below in the visit note.  Chief Complaint  Patient presents with  . Follow-up    HPI: [name] 69 y.o. comes in for Chronic disease management Cut back on breasds  And sweets.    Left  Still having hard time losing weight   Goes out ot eat a lot with friends . Has been to dietary in past  ROS: See pertinent positives and negatives per HPI. No bleedign syncope  lleft leg some swelling more than right  Behind knee? Arthritis   Past Medical History  Diagnosis Date  . History of CVA (cerebrovascular accident)   . CAD (coronary artery disease) [id number]    post PTCA with bare-metal stenting to mid RCA in December 2004     . Hypertension   . Chronic atrial fibrillation 06/2007    Tachybradycardia pacemaker  . Pacemaker   . OSA on CPAP   . Anticoagulant long-term use     pradaxa  . Transfusion history   . Hyperlipidemia   . Edema of lower extremity   . History of acute inferior wall MI   . Anxiety   . Diplopia 06/19/2008    Qualifier: Diagnosis of  By: [name] MD, [name]   . Unspecified hemorrhoids without mention of complication [id number]    Colonoscopy--Dr. Carlean Purl   . Myocardial infarction   . Dysrhythmia     ATRIAL FIBRILATION  . TIA (transient ischemic attack)   . CHF (congestive heart failure)   . Shortness of breath     Family History  Problem Relation Age of Onset  . Suicidality Father     suicide death pt was 3 yrs  . Arrhythmia Mother     heart arrthymia  . Hypertension Mother   . Heart attack Brother     died age 47  . Heart disease Paternal Aunt   . Prostate cancer Maternal Grandfather   . Diabetes Paternal Grandfather     fathers side of the family  . Diabetes Mother     History   Social History  . Marital Status: Married    Spouse Name: N/A  . Number of Children: 0  . Years of Education: HS   Occupational  History  . retired     previously worked [employer]  . Smoking status: Former Smoker -- 1.00 packs/day for 5 years    Types: Cigarettes    Start date: 05/11/1978    Quit date: 07/03/1982  . Smokeless tobacco: Never Used  . Alcohol Use: Yes     Comment: once q 6 months-socailly-wine  . Drug Use: No  . Sexual Activity: Not Currently   Other Topics Concern  . 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   Married    Originally from [employer] of two, high school education   Former smoker [phone] 1ppd   Hunting dogs 7    Retired from [employer] 2     Outpatient Encounter Prescriptions as of 10/12/2014  Medication Sig  . aspirin 81 MG tablet Take 81 mg by mouth daily.   [name] atorvastatin (LIPITOR) 40 MG tablet Take 1 tablet (40 mg total) by mouth every morning.  [name] b complex vitamins tablet Take 1 tablet by mouth daily.  [name]  Cholecalciferol (VITAMIN D-3) 5000 UNITS TABS Take 5,000 Units by mouth every morning.  . dabigatran (PRADAXA) 150 MG CAPS capsule Take 150 mg by mouth 2 (two) times daily.  [name] diltiazem (CARDIZEM CD) 180 MG 24 hr capsule TAKE 1 CAPSULE (180 MG TOTAL) BY MOUTH DAILY.  [name] dofetilide (TIKOSYN) 250 MCG capsule Take 1 capsule (250 mcg total) by mouth 2 (two) times daily.  [name] FLUoxetine (PROZAC) 20 MG capsule TAKE ONE CAPSULE BY MOUTH IN THE MORNING  . furosemide (LASIX) 40 MG tablet Take 1 tablet (40 mg total) by mouth 2 (two) times daily.  [name] ketoconazole (NIZORAL) 2 % cream Apply 1 application topically daily as needed ("PT stated this is for chaffing").   . losartan (COZAAR) 100 MG tablet Take 1 tablet (100 mg total) by mouth daily.  [name] Lysine HCl 1000 MG TABS Take 1 tablet by mouth daily as needed (for fever blisters).   . metoprolol succinate (TOPROL-XL) 100 MG 24 hr tablet Take with or immediately following a meal. Take 100mg  (1 tablet) in am, and 50mg   (1/2 tablet) in pm  . Multiple Vitamin (MULTIVITAMIN) tablet Take 1 tablet by mouth daily.    . nitroGLYCERIN (NITROSTAT) 0.4 MG SL tablet Place 1 tablet (0.4 mg total) under the tongue every 5 (five) minutes as needed.  . potassium chloride SA (K-DUR,KLOR-CON) 20 MEQ tablet Take 1 tablet (20 mEq total) by mouth 2 (two) times daily.  . vitamin B-12 (CYANOCOBALAMIN) 1000 MCG tablet Take 1,000 mcg by mouth daily.  . vitamin E 1000 UNIT capsule Take 1,000 Units by mouth daily.    EXAM:  BP 134/92 mmHg  Temp(Src) 98.3 F (36.8 C) (Oral)  Ht 5' 6.25" (1.683 m)  Wt 261 lb (118.389 kg)  BMI 41.80 kg/m2  Body mass index is 41.8 kg/(m^2).  GENERAL: vitals reviewed and listed above, alert, oriented, appears well hydrated and in no acute distress HEENT: atraumatic, conjunctiva  clear, no obvious abnormalities on inspection of external nose and ears NECK: no obvious masses on inspection palpation  LUNGS: clear to auscultation bilaterally, no wheezes, rales or rhonchi, CV: HRRR, no clubbing cyanosis or  peripheral edema nl cap refill  Left leg 1+ eema  Som vv no ulcers  Post popliteal fossa is full  MS: moves all extremities  PSYCH: pleasant and cooperative, no obvious depression or anxiety Lab Results  Component Value Date   WBC 10.1 03/22/2014   HGB 12.7 03/22/2014   HCT 38.1 03/22/2014   PLT 277 03/22/2014   GLUCOSE 122* 10/05/2014   CHOL 136 11/19/2013   TRIG 117.0 11/19/2013   HDL 47.50 11/19/2013   LDLCALC 65 11/19/2013   ALT 38* 03/22/2014   AST 26 03/22/2014   NA 138 10/05/2014   K 3.8 10/05/2014   CL 102 10/05/2014   CREATININE 1.02 10/05/2014   BUN 23 10/05/2014   CO2 26 10/05/2014   TSH 3.530 03/22/2014   INR 1.30 10/06/2013   HGBA1C 6.6* 10/05/2014   Wt Readings from Last 3 Encounters:  10/12/14 261 lb (118.389 kg)  08/11/14 257 lb 9.6 oz (116.847 kg)  07/13/14 253 lb (114.76 kg)     ASSESSMENT AND PLAN:  Discussed the following assessment and  plan:  Pre-diabetes early DM  - better will hold off on low dose metformin   and follow  intesnify lsi   Essential hypertension  Chronic diastolic heart failure- grade 2 diastolic dysfunction by echo 10/03/13  Urinary frequency - goes every 1-3 hiours  on diuretic   wiould interfere with jury duty  will write letter as requested   Total visit 69mins > 50% spent counseling and coordinating care  Dietary strategies weight loss and jury duty  Leg  Poss bakers cyst.  -Patient advised to return or notify health care team  if symptoms worsen ,persist or new concerns arise.  Patient Instructions  Blood sugars are some better . But would like you to lose weight  To help knee, heart, and diabetes.. Consider seeing dietician again  " how to eat out healthy "  Tracking  Intake .   1/2 portions .  If sugars are worseing we can add low dose metformin.   Will do jury  Note .  ROV   With hg a1c and bmp in 4 months or as needed    Mariann Laster K. [name] M.D.

## 2014-10-13 ENCOUNTER — Encounter: Payer: Self-pay | Admitting: Family Medicine

## 2014-10-28 ENCOUNTER — Other Ambulatory Visit: Payer: Self-pay | Admitting: Cardiovascular Disease

## 2014-11-04 ENCOUNTER — Telehealth: Payer: Self-pay | Admitting: Internal Medicine

## 2014-11-04 NOTE — Telephone Encounter (Signed)
I would recommend that she hold her AM lasix while she is in jury duty.   Continue PM dose as prescribed.   She should be ok to do this for 4 days. She needs to avoid eating any salt.

## 2014-11-04 NOTE — Telephone Encounter (Signed)
I called and reviewed Dr. Elmarie Shiley advice with patient who verbalized understanding and agreement.

## 2014-11-04 NOTE — Telephone Encounter (Signed)
Called and spoke with patient who requests an excuse from Dr. Acie Fredrickson or Dr. Lovena Le for her to be relieved of jury duty due to taking diuretics for CHF twice daily.  I advised her that I will send request to Dr. Acie Fredrickson, however I do not know if court will accept this as an excuse.  Patient is aware that I will call her back today or tomorrow with Dr. Elmarie Shiley reply and she verbalized understanding and agreement.

## 2014-11-04 NOTE — Telephone Encounter (Signed)
New Message  Pt called states that she has jury duty. Request a letter stating that she has CHF and has to take fluid pills. She says that she will have to use the restroom excessively and doesn't believe that she will be a benefit to the court with her condition. Please call back to discuss receiving a letter that will excuse her from jury duty.

## 2014-11-06 ENCOUNTER — Other Ambulatory Visit: Payer: Self-pay | Admitting: Cardiovascular Disease

## 2014-11-20 ENCOUNTER — Other Ambulatory Visit: Payer: Self-pay | Admitting: Cardiovascular Disease

## 2014-11-27 ENCOUNTER — Other Ambulatory Visit: Payer: Self-pay | Admitting: Cardiovascular Disease

## 2014-12-04 ENCOUNTER — Other Ambulatory Visit: Payer: Self-pay | Admitting: Internal Medicine

## 2014-12-04 NOTE — Telephone Encounter (Signed)
Sent to the pharmacy by e-scribe.  Pt has upcoming appt in Aug 2016.

## 2014-12-09 ENCOUNTER — Other Ambulatory Visit: Payer: Self-pay

## 2014-12-09 ENCOUNTER — Other Ambulatory Visit: Payer: Self-pay | Admitting: Cardiovascular Disease

## 2015-02-09 ENCOUNTER — Other Ambulatory Visit (INDEPENDENT_AMBULATORY_CARE_PROVIDER_SITE_OTHER): Payer: PPO

## 2015-02-09 ENCOUNTER — Other Ambulatory Visit: Payer: Self-pay | Admitting: Cardiovascular Disease

## 2015-02-09 DIAGNOSIS — E119 Type 2 diabetes mellitus without complications: Secondary | ICD-10-CM

## 2015-02-09 DIAGNOSIS — I10 Essential (primary) hypertension: Secondary | ICD-10-CM

## 2015-02-09 LAB — BASIC METABOLIC PANEL
BUN: 18 mg/dL (ref 6–23)
CO2: 29 mEq/L (ref 19–32)
Calcium: 9.3 mg/dL (ref 8.4–10.5)
Chloride: 102 mEq/L (ref 96–112)
Creatinine, Ser: 0.99 mg/dL (ref 0.40–1.20)
GFR: 59.11 mL/min — ABNORMAL LOW (ref >60.00)
Glucose, Bld: 116 mg/dL — ABNORMAL HIGH (ref 70–99)
Potassium: 4.8 mEq/L (ref 3.5–5.1)
Sodium: 139 mEq/L (ref 135–145)

## 2015-02-09 LAB — HEMOGLOBIN A1C: Hgb A1c MFr Bld: 6.6 % — ABNORMAL HIGH (ref 4.6–6.5)

## 2015-02-11 ENCOUNTER — Other Ambulatory Visit: Payer: Self-pay | Admitting: Neurosurgery

## 2015-02-11 DIAGNOSIS — M5416 Radiculopathy, lumbar region: Secondary | ICD-10-CM

## 2015-02-16 ENCOUNTER — Other Ambulatory Visit: Payer: Self-pay | Admitting: Neurosurgery

## 2015-02-16 ENCOUNTER — Ambulatory Visit (INDEPENDENT_AMBULATORY_CARE_PROVIDER_SITE_OTHER): Payer: PPO | Admitting: Internal Medicine

## 2015-02-16 ENCOUNTER — Encounter: Payer: Self-pay | Admitting: Internal Medicine

## 2015-02-16 VITALS — BP 124/82 | Temp 98.7°F | Ht 66.25 in | Wt 263.8 lb

## 2015-02-16 DIAGNOSIS — R7303 Prediabetes: Secondary | ICD-10-CM

## 2015-02-16 DIAGNOSIS — R2689 Other abnormalities of gait and mobility: Secondary | ICD-10-CM | POA: Diagnosis not present

## 2015-02-16 DIAGNOSIS — I10 Essential (primary) hypertension: Secondary | ICD-10-CM

## 2015-02-16 DIAGNOSIS — R7309 Other abnormal glucose: Secondary | ICD-10-CM | POA: Diagnosis not present

## 2015-02-16 DIAGNOSIS — M5417 Radiculopathy, lumbosacral region: Secondary | ICD-10-CM

## 2015-02-16 NOTE — Progress Notes (Signed)
Pre visit review using our clinic review tool, if applicable. No additional management support is needed unless otherwise documented below in the visit note.  Chief Complaint  Patient presents with  . Follow-up    HPI: [name] 69 y.o.  comes in for chronic disease/ medication management seh has pre diabetes , ht chornic dastolic heart falirue cad post ptca stent,hx of cva  OSA On anticoagulation for AF Doing ok but husband hip and knees uregy and disclocation.  She has knee pain djdPain on motion   With sciatica and knee arthritis .   [employer] and [employer]  Pending injections  Mom is 79 .   ROS: See pertinent positives and negatives per HPI. No falling bleeding.   Past Medical History  Diagnosis Date  . History of CVA (cerebrovascular accident)   . CAD (coronary artery disease) [id number]    post PTCA with bare-metal stenting to mid RCA in December 2004     . Hypertension   . Chronic atrial fibrillation 06/2007    Tachybradycardia pacemaker  . Pacemaker   . OSA on CPAP   . Anticoagulant long-term use     pradaxa  . Transfusion history   . Hyperlipidemia   . Edema of lower extremity   . History of acute inferior wall MI   . Anxiety   . Diplopia 06/19/2008    Qualifier: Diagnosis of  By: [name] MD, [name]   . Unspecified hemorrhoids without mention of complication [id number]    Colonoscopy--Dr. Carlean Purl   . Myocardial infarction   . Dysrhythmia     ATRIAL FIBRILATION  . TIA (transient ischemic attack)   . CHF (congestive heart failure)   . Shortness of breath     Family History  Problem Relation Age of Onset  . Suicidality Father     suicide death pt was 3 yrs  . Arrhythmia Mother     heart arrthymia  . Hypertension Mother   . Heart attack Brother     died age 69  . Heart disease Paternal Aunt   . Prostate cancer Maternal Grandfather   . Diabetes Paternal Grandfather     fathers side of the family  . Diabetes Mother     Social History   Social History  .  Marital Status: Married    Spouse Name: N/A  . Number of Children: 0  . Years of Education: HS   Occupational History  . retired     previously worked [employer]  . Smoking status: Former Smoker -- 1.00 packs/day for 5 years    Types: Cigarettes    Start date: 05/11/1978    Quit date: 07/03/1982  . Smokeless tobacco: Never Used  . Alcohol Use: Yes     Comment: once q 6 months-socailly-wine  . Drug Use: No  . Sexual Activity: Not Currently   Other Topics Concern  . 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   Married    Originally from [place] of two, high school education   Former smoker [phone] 1ppd   Hunting dogs 7    Retired from [employer] 2     Outpatient Prescriptions Prior to Visit  Medication Sig Dispense Refill  . aspirin 81 MG tablet Take 81 mg by mouth daily.     [name] atorvastatin (LIPITOR) 40 MG tablet TAKE  1 TABLET EVERY DAY 90 tablet 0  . b complex vitamins tablet Take 1 tablet by mouth daily.    . Cholecalciferol (VITAMIN D-3) 5000 UNITS TABS Take 5,000 Units by mouth every morning.    . dabigatran (PRADAXA) 150 MG CAPS capsule Take 150 mg by mouth 2 (two) times daily.    [name] FLUoxetine (PROZAC) 20 MG capsule TAKE ONE CAPSULE BY MOUTH EVERY MORNING 90 capsule 0  . furosemide (LASIX) 40 MG tablet Take 1 tablet (40 mg total) by mouth 2 (two) times daily. 180 tablet 3  . ketoconazole (NIZORAL) 2 % cream Apply 1 application topically daily as needed ("PT stated this is for chaffing").     . losartan (COZAAR) 100 MG tablet Take 1 tablet (100 mg total) by mouth daily. 90 tablet 3  . Lysine HCl 1000 MG TABS Take 1 tablet by mouth daily as needed (for fever blisters).     . metoprolol succinate (TOPROL-XL) 100 MG 24 hr tablet TAKE 1 TABLET BY MOUTH EVERY MORNING & 1/2 TAB IN THE EVENING WITH OR IMMEDIATELY FOLLOWING A MEAL 45 tablet 0  .  Multiple Vitamin (MULTIVITAMIN) tablet Take 1 tablet by mouth daily.      . nitroGLYCERIN (NITROSTAT) 0.4 MG SL tablet Place 1 tablet (0.4 mg total) under the tongue every 5 (five) minutes as needed. 25 tablet 5  . potassium chloride SA (K-DUR,KLOR-CON) 20 MEQ tablet Take 1 tablet (20 mEq total) by mouth 2 (two) times daily. 180 tablet 3  . PRADAXA 150 MG CAPS capsule TAKE 1 CAPSULE EVERY 12 HOURS 180 capsule 3  . TIKOSYN 250 MCG capsule TAKE 1 CAPSULE (250 MCG TOTAL) BY MOUTH 2 (TWO) TIMES DAILY. 180 capsule 0  . vitamin B-12 (CYANOCOBALAMIN) 1000 MCG tablet Take 1,000 mcg by mouth daily.    . vitamin E 1000 UNIT capsule Take 1,000 Units by mouth daily.    [name] diltiazem (CARDIZEM CD) 180 MG 24 hr capsule TAKE 1 CAPSULE (180 MG TOTAL) BY MOUTH DAILY. 90 capsule 1   No facility-administered medications prior to visit.     EXAM:  BP 124/82 mmHg  Temp(Src) 98.7 F (37.1 C) (Oral)  Ht 5' 6.25" (1.683 m)  Wt 263 lb 12.8 oz (119.659 kg)  BMI 42.25 kg/m2  Body mass index is 42.25 kg/(m^2).  GENERAL: vitals reviewed and listed above, alert, oriented, appears well hydrated and in no acute distress MS: moves all extremities  Walks with cane  Limping   ( has left radicular pain  No acute redness   PSYCH: pleasant and cooperative, no obvious depression or anxiety Lab Results  Component Value Date   WBC 10.1 03/22/2014   HGB 12.7 03/22/2014   HCT 38.1 03/22/2014   PLT 277 03/22/2014   GLUCOSE 116* 02/09/2015   CHOL 136 11/19/2013   TRIG 117.0 11/19/2013   HDL 47.50 11/19/2013   LDLCALC 65 11/19/2013   ALT 38* 03/22/2014   AST 26 03/22/2014   NA 139 02/09/2015   K 4.8 02/09/2015   CL 102 02/09/2015   CREATININE 0.99 02/09/2015   BUN 18 02/09/2015   CO2 29 02/09/2015   TSH 3.530 03/22/2014   INR 1.30 10/06/2013   HGBA1C 6.6* 02/09/2015   BP Readings from Last 3 Encounters:  02/16/15 124/82  10/12/14 134/92  08/11/14 112/68   Wt Readings from Last 3 Encounters:  02/16/15 263 lb  12.8 oz (119.659 kg)  10/12/14 261 lb (118.389 kg)  08/11/14 257 lb 9.6 oz (116.847 kg)  ASSESSMENT AND PLAN:  Discussed the following assessment and plan:  Pre-diabetes early DM  - stable no change no new meds  lsi reviewed with strategies  try water exercise aerobics   Decreased mobility - related to knee and back undercare . look into water exercise   Essential hypertension  Severe obesity (BMI >= 40) Wellness with full labs and a1c in 4 months  Ok for handicap sticker . Flu vaccine in fall.   -Patient advised to return or notify health care team  if symptoms worsen ,persist or new concerns arise.  Patient Instructions  Continue lifestyle intervention healthy eating and exercise . Let us know how we can help . Water exercises can help.  Keep muscles strong.  Sugars  are stable.    Plan wellness visit with full labs and hg a1c in about 4 months.    Flu vaccine in fall October is good.     [name]. [name] M.D.

## 2015-02-16 NOTE — Patient Instructions (Addendum)
Continue lifestyle intervention healthy eating and exercise . Let us know how we can help . Water exercises can help.  Keep muscles strong.  Sugars  are stable.    Plan wellness visit with full labs and hg a1c in about 4 months.    Flu vaccine in fall October is good.

## 2015-02-22 ENCOUNTER — Other Ambulatory Visit: Payer: Self-pay

## 2015-02-22 ENCOUNTER — Other Ambulatory Visit: Payer: Self-pay | Admitting: Neurosurgery

## 2015-02-22 DIAGNOSIS — M5416 Radiculopathy, lumbar region: Secondary | ICD-10-CM

## 2015-02-25 ENCOUNTER — Ambulatory Visit
Admission: RE | Admit: 2015-02-25 | Discharge: 2015-02-25 | Disposition: A | Discharge status: Home / Self Care | Payer: PPO | Source: Ambulatory Visit | Attending: Neurosurgery | Admitting: Neurosurgery

## 2015-02-25 DIAGNOSIS — M5416 Radiculopathy, lumbar region: Secondary | ICD-10-CM

## 2015-03-02 ENCOUNTER — Other Ambulatory Visit: Payer: Self-pay | Admitting: Internal Medicine

## 2015-03-03 ENCOUNTER — Telehealth: Payer: Self-pay | Admitting: Cardiovascular Disease

## 2015-03-03 ENCOUNTER — Encounter: Payer: Self-pay | Admitting: *Deleted

## 2015-03-03 NOTE — Telephone Encounter (Signed)
New message    Request for surgical clearance:  1. What type of surgery is being performed? Lumbar Fusion  2. When is this surgery scheduled? Pending approval  3. Are there any medications that need to be held prior to surgery and how long?Blood thinners; dr's decision on how long  4. Name of physician performing surgery? Dr Annette Stable  5. What is your office phone and fax number? [facility] [phone]     Fax [phone]

## 2015-03-03 NOTE — Telephone Encounter (Signed)
Called back and requested that they fax their clearance form to attn: [facility]

## 2015-03-03 NOTE — Telephone Encounter (Signed)
Sent to the pharmacy by e-scribe.  Pt has upcoming appt on 06/11/15

## 2015-03-09 ENCOUNTER — Other Ambulatory Visit: Payer: Self-pay | Admitting: Neurosurgery

## 2015-03-09 ENCOUNTER — Other Ambulatory Visit: Payer: Self-pay | Admitting: Cardiovascular Disease

## 2015-03-16 ENCOUNTER — Other Ambulatory Visit: Payer: Self-pay | Admitting: Internal Medicine

## 2015-03-16 ENCOUNTER — Other Ambulatory Visit: Payer: Self-pay | Admitting: Cardiovascular Disease

## 2015-03-17 NOTE — Telephone Encounter (Signed)
This was originally prescribed by Dr Acie Fredrickson, but was removed from patients med list by patients primary care physician at her 02/16/15 visit with a reason of error. Just wanted to verify that she should still be taking this. Please advise. Thanks, MI

## 2015-03-18 ENCOUNTER — Ambulatory Visit (INDEPENDENT_AMBULATORY_CARE_PROVIDER_SITE_OTHER): Payer: PPO | Admitting: *Deleted

## 2015-03-18 DIAGNOSIS — I495 Sick sinus syndrome: Secondary | ICD-10-CM | POA: Diagnosis not present

## 2015-03-18 DIAGNOSIS — Z950 Presence of cardiac pacemaker: Secondary | ICD-10-CM | POA: Diagnosis not present

## 2015-03-18 LAB — CUP PACEART INCLINIC DEVICE CHECK
Battery Impedance: 1000 Ohm — CL
Battery Voltage: 2.79 V
Brady Statistic RA Percent Paced: 1.2 %
Brady Statistic RV Percent Paced: 1 % — CL
Date Time Interrogation Session: 20160915111328
Lead Channel Impedance Value: 402 Ohm
Lead Channel Impedance Value: 458 Ohm
Lead Channel Pacing Threshold Amplitude: 0.5 V
Lead Channel Pacing Threshold Amplitude: 1.5 V
Lead Channel Pacing Threshold Pulse Width: 0.4 ms
Lead Channel Pacing Threshold Pulse Width: 0.4 ms
Lead Channel Setting Pacing Amplitude: 2 V
Lead Channel Setting Pacing Amplitude: 3 V
Lead Channel Setting Pacing Pulse Width: 0.4 ms
Lead Channel Setting Sensing Sensitivity: 2 mV
Pulse Gen Model: 5826
Pulse Gen Serial Number: 1999089

## 2015-03-18 NOTE — Progress Notes (Signed)
Pacemaker check in clinic. Normal device function. Thresholds, sensing, impedances consistent with previous measurements. Device programmed to maximize longevity. No mode switch or high ventricular rates noted. Device programmed at appropriate safety margins. Histogram distribution appropriate for patient activity level. Device programmed to optimize intrinsic conduction. Estimated longevity 5->62yrs. ROV w/ GT in 5mo.

## 2015-03-24 ENCOUNTER — Other Ambulatory Visit: Payer: Self-pay | Admitting: Physician Assistant

## 2015-03-29 NOTE — Pre-Procedure Instructions (Signed)
Jalah C Salva  03/29/2015      [id number] [place], [place] - [address] [place] [address] Phone: [phone] Fax: [phone]  [facility] [place] [place] [address] Phone: [phone] Fax: [phone]    Your procedure is scheduled on Mon, Oct 3 @ 8:00 AM  Report to [facility] Admitting at 6:00 AM  Call this number if you have problems the morning of surgery:  [phone]   Remember:  Do not eat food or drink liquids after midnight.  Take these medicines the morning of surgery with A SIP OF WATER Zyrtec(Cetirizine),Diltiazem(Cardizem),Fluoxetine(Prozac),and Metoprolol(Toprol)               Hold your Pradaxa 3 days prior to surgery per Dr.Nahser. Stop taking your Vit E,Vitamin's,or any Herbal Medications. No Goody's,BC's,Aleve,or Ibuprofen.   Do not wear jewelry, make-up or nail polish.  Do not wear lotions, powders, or perfumes.  You may wear deodorant.  Do not shave 48 hours prior to surgery.   Do not bring valuables to the hospital.  [facility] is not responsible for any belongings or valuables.  Contacts, dentures or bridgework may not be worn into surgery.  Leave your suitcase in the car.  After surgery it may be brought to your room.  For patients admitted to the hospital, discharge time will be determined by your treatment team.  Patients discharged the day of surgery will not be allowed to drive home.   Special instructions:  [facility] - Preparing for Surgery  Before surgery, you can play an important role.  Because skin is not sterile, your skin needs to be as free of germs as possible.  You can reduce the number of germs on you skin by washing with CHG (chlorahexidine gluconate) soap before surgery.  CHG is an antiseptic cleaner which kills germs and bonds with the skin to continue killing germs even after  washing.  Please DO NOT use if you have an allergy to CHG or antibacterial soaps.  If your skin becomes reddened/irritated stop using the CHG and inform your nurse when you arrive at Short Stay.  Do not shave (including legs and underarms) for at least 48 hours prior to the first CHG shower.  You may shave your face.  Please follow these instructions carefully:   1.  Shower with CHG Soap the night before surgery and the                                morning of Surgery.  2.  If you choose to wash your hair, wash your hair first as usual with your       normal shampoo.  3.  After you shampoo, rinse your hair and body thoroughly to remove the                      Shampoo.  4.  Use CHG as you would any other liquid soap.  You can apply chg directly       to the skin and wash gently with scrungie or a clean washcloth.  5.  Apply the CHG Soap to your body ONLY FROM THE NECK DOWN.        Do not use on open wounds or open sores.  Avoid contact with  your eyes,       ears, mouth and genitals (private parts).  Wash genitals (private parts)       with your normal soap.  6.  Wash thoroughly, paying special attention to the area where your surgery        will be performed.  7.  Thoroughly rinse your body with warm water from the neck down.  8.  DO NOT shower/wash with your normal soap after using and rinsing off       the CHG Soap.  9.  Pat yourself dry with a clean towel.            10.  Wear clean pajamas.            11.  Place clean sheets on your bed the night of your first shower and do not        sleep with pets.  Day of Surgery  Do not apply any lotions/deoderants the morning of surgery.  Please wear clean clothes to the hospital/surgery center.    Please read over the following fact sheets that you were given. Pain Booklet, Coughing and Deep Breathing, Blood Transfusion Information, MRSA Information and Surgical Site Infection Prevention

## 2015-03-30 ENCOUNTER — Encounter (HOSPITAL_COMMUNITY): Payer: Self-pay

## 2015-03-30 ENCOUNTER — Encounter: Payer: Self-pay | Admitting: Cardiovascular Disease

## 2015-03-30 ENCOUNTER — Ambulatory Visit (INDEPENDENT_AMBULATORY_CARE_PROVIDER_SITE_OTHER): Payer: PPO | Admitting: Cardiovascular Disease

## 2015-03-30 ENCOUNTER — Encounter (HOSPITAL_COMMUNITY)
Admission: RE | Admit: 2015-03-30 | Discharge: 2015-03-30 | Disposition: A | Discharge status: Home / Self Care | Payer: PPO | Source: Ambulatory Visit | Attending: Neurosurgery | Admitting: Neurosurgery

## 2015-03-30 VITALS — BP 112/90 | HR 66 | Ht 66.5 in | Wt 265.0 lb

## 2015-03-30 DIAGNOSIS — Z7982 Long term (current) use of aspirin: Secondary | ICD-10-CM | POA: Insufficient documentation

## 2015-03-30 DIAGNOSIS — I480 Paroxysmal atrial fibrillation: Secondary | ICD-10-CM | POA: Diagnosis not present

## 2015-03-30 DIAGNOSIS — Z955 Presence of coronary angioplasty implant and graft: Secondary | ICD-10-CM | POA: Insufficient documentation

## 2015-03-30 DIAGNOSIS — Z8673 Personal history of transient ischemic attack (TIA), and cerebral infarction without residual deficits: Secondary | ICD-10-CM | POA: Diagnosis not present

## 2015-03-30 DIAGNOSIS — E785 Hyperlipidemia, unspecified: Secondary | ICD-10-CM | POA: Insufficient documentation

## 2015-03-30 DIAGNOSIS — I509 Heart failure, unspecified: Secondary | ICD-10-CM | POA: Diagnosis not present

## 2015-03-30 DIAGNOSIS — I2510 Atherosclerotic heart disease of native coronary artery without angina pectoris: Secondary | ICD-10-CM | POA: Diagnosis not present

## 2015-03-30 DIAGNOSIS — Z79899 Other long term (current) drug therapy: Secondary | ICD-10-CM | POA: Diagnosis not present

## 2015-03-30 DIAGNOSIS — Z87891 Personal history of nicotine dependence: Secondary | ICD-10-CM | POA: Diagnosis not present

## 2015-03-30 DIAGNOSIS — Z950 Presence of cardiac pacemaker: Secondary | ICD-10-CM | POA: Insufficient documentation

## 2015-03-30 DIAGNOSIS — Z0183 Encounter for blood typing: Secondary | ICD-10-CM | POA: Insufficient documentation

## 2015-03-30 DIAGNOSIS — Z01812 Encounter for preprocedural laboratory examination: Secondary | ICD-10-CM | POA: Insufficient documentation

## 2015-03-30 DIAGNOSIS — I252 Old myocardial infarction: Secondary | ICD-10-CM | POA: Insufficient documentation

## 2015-03-30 DIAGNOSIS — R9431 Abnormal electrocardiogram [ECG] [EKG]: Secondary | ICD-10-CM | POA: Diagnosis not present

## 2015-03-30 DIAGNOSIS — M4310 Spondylolisthesis, site unspecified: Secondary | ICD-10-CM | POA: Diagnosis not present

## 2015-03-30 DIAGNOSIS — I5031 Acute diastolic (congestive) heart failure: Secondary | ICD-10-CM

## 2015-03-30 DIAGNOSIS — Z01818 Encounter for other preprocedural examination: Secondary | ICD-10-CM | POA: Insufficient documentation

## 2015-03-30 DIAGNOSIS — I482 Chronic atrial fibrillation: Secondary | ICD-10-CM | POA: Diagnosis not present

## 2015-03-30 DIAGNOSIS — Z7902 Long term (current) use of antithrombotics/antiplatelets: Secondary | ICD-10-CM | POA: Insufficient documentation

## 2015-03-30 DIAGNOSIS — G4733 Obstructive sleep apnea (adult) (pediatric): Secondary | ICD-10-CM | POA: Diagnosis not present

## 2015-03-30 DIAGNOSIS — I10 Essential (primary) hypertension: Secondary | ICD-10-CM | POA: Insufficient documentation

## 2015-03-30 HISTORY — DX: Chronic kidney disease, unspecified: N18.9

## 2015-03-30 HISTORY — DX: Unspecified osteoarthritis, unspecified site: M19.90

## 2015-03-30 HISTORY — DX: Pneumonia, unspecified organism: J18.9

## 2015-03-30 LAB — CBC WITH DIFFERENTIAL/PLATELET
Basophils Absolute: 0.1 10*3/uL (ref 0.0–0.1)
Basophils Relative: 1 %
Eosinophils Absolute: 0.4 10*3/uL (ref 0.0–0.7)
Eosinophils Relative: 4 %
HCT: 44 % (ref 36.0–46.0)
Hemoglobin: 14.3 g/dL (ref 12.0–15.0)
Lymphocytes Relative: 23 %
Lymphs Abs: 2.4 10*3/uL (ref 0.7–4.0)
MCH: 31.6 pg (ref 26.0–34.0)
MCHC: 32.5 g/dL (ref 30.0–36.0)
MCV: 97.1 fL (ref 78.0–100.0)
Monocytes Absolute: 0.8 10*3/uL (ref 0.1–1.0)
Monocytes Relative: 8 %
Neutro Abs: 6.6 10*3/uL (ref 1.7–7.7)
Neutrophils Relative %: 64 %
Platelets: 266 10*3/uL (ref 150–400)
RBC: 4.53 MIL/uL (ref 3.87–5.11)
RDW: 14.1 % (ref 11.5–15.5)
WBC: 10.2 10*3/uL (ref 4.0–10.5)

## 2015-03-30 LAB — BASIC METABOLIC PANEL
Anion gap: 8 (ref 5–15)
BUN: 22 mg/dL — ABNORMAL HIGH (ref 6–20)
CO2: 27 mmol/L (ref 22–32)
Calcium: 9.4 mg/dL (ref 8.9–10.3)
Chloride: 102 mmol/L (ref 101–111)
Creatinine, Ser: 0.94 mg/dL (ref 0.44–1.00)
GFR calc Af Amer: >60 mL/min (ref >60)
GFR calc non Af Amer: >60 mL/min (ref >60)
Glucose, Bld: 114 mg/dL — ABNORMAL HIGH (ref 65–99)
Potassium: 5.1 mmol/L (ref 3.5–5.1)
Sodium: 137 mmol/L (ref 135–145)

## 2015-03-30 LAB — TYPE AND SCREEN
ABO/RH(D): A POS
Antibody Screen: NEGATIVE

## 2015-03-30 LAB — SURGICAL PCR SCREEN
MRSA, PCR: NEGATIVE
Staphylococcus aureus: NEGATIVE

## 2015-03-30 LAB — ABO/RH: ABO/RH(D): A POS

## 2015-03-30 NOTE — Progress Notes (Signed)
[name] Date of Birth  01-16-1946 [facility] [address]    [place], [place]  [address] [phone]  Fax  [phone]   Problem list: 1. Atrial fibrillation 2. Status post pacemaker implantation 3. Coronary artery disease-status post PTCA and stenting of her right coronary artery 4. Hyperlipidemia 5. Hypertension 6. Obstructive sleep apnea-currently on CPAP  History of Present Illness:  [name] is a 69 year old female with a history of atrial fibrillation. She status post pacemaker implantation. She also has a history of coronary artery disease and is status post PTCA and stenting of the right coronary artery. She also has a history of hyperlipidemia, hypertension, and sleep apnea.  She uses a CPAP mask at night.  The CPAP mask has really helped her stay in normal sinus rhythm.  She complains of having some episodes of chest tightness. This occurs at times. One episode was relieved with sublingual nitroglycerin. She states that it is not nearly as severe as her previous episodes of angina.  January 13, 21014: She was hospitalized in November with severe shortness breath and "double pneumonia". She is slowly recovering.  She's looking forward to  getting back into her exercise program.  She has not been exercising at all.  She's had some leg edema.  She denies any chest pain or shortness breath.  She's has mild leg swelling for the past several months. She does not add salt any of her food. She tries to stay away from salty foods and processed meat.  September 18, 2013:  She remains short of breath  - especialy with exertion  Echo in Jan. 2014:   - Left ventricle: The cavity size was normal. Wall thickness was increased in a pattern of mild LVH. Systolic function was normal. The estimated ejection fraction was in the range of 60% to 65%. - Pulmonary arteries: PA peak pressure: 45mm Hg   October 03, 2013:  Level was seen about 2 weeks ago with some increasing  shortness of breath. She was noted to have moderate pulmonary hypertension on echo. She started on diltiazem. She has fairly well preserved left ventricle systolic function.  We started her on Cardizem XL 180 mg a day. She has not done well since that medication in addition. She's more short of breath. She's gained about 5 pounds.  She presents today for her echocardiogram. She is very short of breath and was worked into the schedule. Her O2 saturations at rest were 86%. We placed  oxygen on her and her O2 saturations increased above 90%. She is clearly more short of breath after the addition of the diltiazem.   She has not been eating any extra salt .  Has been taking her lasix.   She denies any chest pain - specifically was asked about pleuretic CP.  Had orthopnea until we placed the oxygen on her.   10/20/2013: [name] was admitted to the hospital with severe shortness breath when I saw her several weeks ago. She was found to be volume overload. She was aggressively diuresed and lost about 10 pounds of weight. Cardiac catheterization following that diuresis revealed only minimal coronary artery irregularities. Her left ventricular systolic function was normal. Her PA pressures were moderately elevated with an estimated PA pressure of around 57.   She was discharged on the same dose of Lasix that she was on at admission.   She is avoiding salt.    Dec. 3, 2015:  [name] is a 69 yo followed for chronic  diastolic CHf and pulmonary hypertension ( est. PA pressure of 70)  [name] has been back in the hospital since I last saw her.  She has  Increased her lasix which has helped.  She does a pretty good job of avoiding salt.   Sept. 27, 2016:  Is doing well.  Has some dyspnea - she thinks likely due to weight gain . No cardiac symptoms .  Needs to have back surgery .    Current Outpatient Prescriptions on File Prior to Visit  Medication Sig Dispense Refill  . atorvastatin (LIPITOR) 40 MG tablet  TAKE 1 TABLET BY MOUTH EVERY DAY (Patient taking differently: TAKE 1 TABLET BY MOUTH EVERY DAY    in the a.m.) 30 tablet 0  . b complex vitamins tablet Take 1 tablet by mouth daily.    . Cholecalciferol (VITAMIN D-3) 5000 UNITS TABS Take 5,000 Units by mouth every morning.    . dabigatran (PRADAXA) 150 MG CAPS capsule Take 150 mg by mouth 2 (two) times daily.    [name] diltiazem (CARDIZEM CD) 180 MG 24 hr capsule TAKE 1 CAPSULE (180 MG TOTAL) BY MOUTH DAILY. (Patient taking differently: TAKE 1 CAPSULE (180 MG TOTAL) BY MOUTH DAILY.    in the a.m.) 90 capsule 1  . FLUoxetine (PROZAC) 20 MG capsule TAKE ONE CAPSULE BY MOUTH EVERY MORNING 90 capsule 1  . furosemide (LASIX) 40 MG tablet Take 1 tablet (40 mg total) by mouth 2 (two) times daily. 180 tablet 3  . losartan (COZAAR) 100 MG tablet TAKE 1 TABLET BY MOUTH EVERY DAY 90 tablet 3  . Lysine HCl 1000 MG TABS Take 1 tablet by mouth daily as needed (for fever blisters).     . metoprolol succinate (TOPROL-XL) 100 MG 24 hr tablet TAKE 1 TABLET BY MOUTH EVERY MORNING & 1/2 TAB IN THE EVENING WITH OR IMMEDIATELY FOLLOWING A MEAL 45 tablet 0  . Multiple Vitamin (MULTIVITAMIN) tablet Take 1 tablet by mouth daily.      . Naproxen Sod-Diphenhydramine (ALEVE PM) 220-25 MG TABS Take 2 tablets by mouth at bedtime.    . nitroGLYCERIN (NITROSTAT) 0.4 MG SL tablet Place 1 tablet (0.4 mg total) under the tongue every 5 (five) minutes as needed. (Patient taking differently: Place 0.4 mg under the tongue every 5 (five) minutes as needed for chest pain. ) 25 tablet 5  . potassium chloride SA (K-DUR,KLOR-CON) 20 MEQ tablet Take 1 tablet (20 mEq total) by mouth 2 (two) times daily. 180 tablet 3  . TIKOSYN 250 MCG capsule TAKE 1 CAPSULE (250 MCG TOTAL) BY MOUTH 2 (TWO) TIMES DAILY. 180 capsule 0  . vitamin B-12 (CYANOCOBALAMIN) 1000 MCG tablet Take 1,000 mcg by mouth daily.    . vitamin E 1000 UNIT capsule Take 1,000 Units by mouth daily.     No current facility-administered  medications on file prior to visit.    No Known Allergies  Past Medical History  Diagnosis Date  . History of CVA (cerebrovascular accident)   . CAD (coronary artery disease) [id number]    post PTCA with bare-metal stenting to mid RCA in December 2004     . Hypertension   . Chronic atrial fibrillation 06/2007    Tachybradycardia pacemaker  . Pacemaker   . Anticoagulant long-term use     pradaxa  . Transfusion history   . Hyperlipidemia   . Edema of lower extremity   . History of acute inferior wall MI   . Anxiety   . Diplopia 06/19/2008  Qualifier: Diagnosis of  By: [name] MD, [name]   . Unspecified hemorrhoids without mention of complication [id number]    Colonoscopy--Dr. Carlean Purl   . Dysrhythmia     ATRIAL FIBRILATION  . TIA (transient ischemic attack)   . CHF (congestive heart failure)   . Shortness of breath   . Myocardial infarction [id number]  . Stroke [id number]  . Pneumonia 2014    tx. ----  [facility]  . Depression   . Chronic kidney disease     10% function - ?R, other kidney is compensating    . Arthritis     low back & both knees, Spondylolisthesis    . OSA on CPAP     last test- 2010    Past Surgical History  Procedure Laterality Date  . Permanent pacemaker  06/2007  . Back surgeries      x2-3 by Dr [employer] Gammon  . Coronary stent placement      C stent  . Coronary angioplasty with stent placement  1998  . Doppler echocardiography  2009  . Back surgery    . Left and right heart catheterization with coronary angiogram N/A 10/06/2013    Procedure: LEFT AND RIGHT HEART CATHETERIZATION WITH CORONARY ANGIOGRAM;  Surgeon: Troy Sine, MD;  Location: [facility] CATH LAB;  Service: Cardiovascular;  Laterality: N/A;  . Total abdominal hysterectomy  1984    2 surgeries endometriosis bso  . Cholecystectomy  1994  . Appendectomy      History  Smoking status  . Former Smoker -- 1.00 packs/day for 5 years  . Types: Cigarettes  . Start date: 05/11/1978  . Quit  date: 07/03/1982  Smokeless tobacco  . Never Used    History  Alcohol Use  . Yes    Comment: once q 6 months-socailly-wine    Family History  Problem Relation Age of Onset  . Suicidality Father     suicide death pt was 3 yrs  . Arrhythmia Mother     heart arrthymia  . Hypertension Mother   . Heart attack Brother     died age 19  . Heart disease Paternal Aunt   . Prostate cancer Maternal Grandfather   . Diabetes Paternal Grandfather     fathers side of the family  . Diabetes Mother     Reviw of Systems:  Reviewed in the HPI.  All other systems are negative.  Physical Exam: BP 112/90 mmHg  Pulse 66  Ht 5' 6.5" (1.689 m)  Wt 120.203 kg (265 lb)  BMI 42.14 kg/m2  SpO2 94% The patient is alert and oriented x 3.     HEENT:   She has normal carotids.  no JVD,   Her mucous membranes are moist.  Lungs: Lung exam is clear.   Heart: Regular rate S1-S2.  No S3 heard  Abdomen: Her abdomen is soft. She has good bowel sounds.  Extremities:  She has trace - 1+  ankle edema.  Neuro:  Her gait is normal. Her neuro exam is nonfocal.    ECG:   Assessment / Plan:  1. Atrial fibrillation -  Maintaining NSR with Tikosyn.  On Pradaxa.  On Dilt .  2. Status post pacemaker implantation 3. Coronary artery disease-status post PTCA and stenting of her right coronary artery - no angina   4. Hyperlipidemia -   Will check lipids at her next office visit  5. Hypertension -  BP is well controlled  6. Obstructive sleep apnea-currently on CPAP 7. Spinal disc  disease.  Needs to stop her Pradaxa on Set. 29 for her back surgery on Oct. 3.  She only needs to hold Pradaxa for 2 days .   Will go ahead and agree to have her stop after taking it on Sept. 29 since that is what she has been instructed to do .      Will have her take Pradaxa on Sept. 29 which will be 3-4 days prior to her surgery .  She is at low risk for her upcoming back surgery .     Nahser, Wonda Cheng, MD  03/30/2015 3:00  PM    [facility] [place],  [place], [place]  [address] Pager [phone] Phone: [phone]; Fax: [phone]   [facility]  [address] [place], [place]  [address] [phone]   Fax [phone]

## 2015-03-30 NOTE — Patient Instructions (Addendum)
Medication Instructions:  Your physician recommends that you continue on your current medications as directed. Please refer to the Current Medication list given to you today.   Labwork: None Ordered   Testing/Procedures: None Ordered   Follow-Up: Your physician wants you to follow-up in: 6 months with Dr. Nahser.  You will receive a reminder letter in the mail two months in advance. If you don't receive a letter, please call our office to schedule the follow-up appointment.     

## 2015-03-30 NOTE — Pre-Procedure Instructions (Signed)
[name]  03/30/2015      [id number] [place], [place] - [address] [place] [address] Phone: [phone] Fax: [phone]  [facility] [place] [place] [address] Phone: [phone] Fax: [phone]    Your procedure is scheduled on 04/05/2015  Report to [facility] Admitting at 5:30 A.M.  Call this number if you have problems the morning of surgery:  [phone]   Remember:  Do not eat food or drink liquids after midnight.  On Sunday   Take these medicines the morning of surgery with A SIP OF WATER : Diltiazem, Metoprolol, Prozac, Zyrtec               Last Dose of Pradaxa is 04/01/2015    Do not wear jewelry, make-up or nail polish.   Do not wear lotions, powders, or perfumes.  You may wear deodorant.   Do not shave 48 hours prior to surgery.     Do not bring valuables to the hospital.   [facility] is not responsible for any belongings or valuables.  Contacts, dentures or bridgework may not be worn into surgery.  Leave your suitcase in the car.  After surgery it may be brought to your room.  For patients admitted to the hospital, discharge time will be determined by your treatment team.  Patients discharged the day of surgery will not be allowed to drive home.   Name and phone number of your driver:   Spouse   Special instructions:  Special Instructions: [facility] - Preparing for Surgery  Before surgery, you can play an important role.  Because skin is not sterile, your skin needs to be as free of germs as possible.  You can reduce the number of germs on you skin by washing with CHG (chlorahexidine gluconate) soap before surgery.  CHG is an antiseptic cleaner which kills germs and bonds with the skin to continue killing germs even after washing.  Please DO NOT use if you have an allergy to CHG or  antibacterial soaps.  If your skin becomes reddened/irritated stop using the CHG and inform your nurse when you arrive at Short Stay.  Do not shave (including legs and underarms) for at least 48 hours prior to the first CHG shower.  You may shave your face.  Please follow these instructions carefully:   1.  Shower with CHG Soap the night before surgery and the  morning of Surgery.  2.  If you choose to wash your hair, wash your hair first as usual with your  normal shampoo.  3.  After you shampoo, rinse your hair and body thoroughly to remove the  Shampoo.  4.  Use CHG as you would any other liquid soap.  You can apply chg directly to the skin and wash gently with scrungie or a clean washcloth.  5.  Apply the CHG Soap to your body ONLY FROM THE NECK DOWN.    Do not use on open wounds or open sores.  Avoid contact with your eyes, ears, mouth and genitals (private parts).  Wash genitals (private parts)   with your normal soap.  6.  Wash thoroughly, paying special attention to the area where your surgery will be performed.  7.  Thoroughly rinse your body with warm water from the neck down.  8.  DO NOT shower/wash with your  normal soap after using and rinsing off   the CHG Soap.  9.  Pat yourself dry with a clean towel.            10.  Wear clean pajamas.            11.  Place clean sheets on your bed the night of your first shower and do not sleep with pets.  Day of Surgery  Do not apply any lotions/deodorants the morning of surgery.  Please wear clean clothes to the hospital/surgery center.  Please read over the following fact sheets that you were given. Pain Booklet, Coughing and Deep Breathing, Blood Transfusion Information, MRSA Information and Surgical Site Infection Prevention

## 2015-03-30 NOTE — Progress Notes (Signed)
Pt. Informed on stopping Pradax & aleve & aspirin & Vitamin E

## 2015-03-30 NOTE — Progress Notes (Signed)
Pt. Denies any new changes in chest, no SOB, no difficulty breathing. Pt. Has pacemaker & recently was checked in office. Faxed device order form to cardiac device clinic.

## 2015-03-31 NOTE — Progress Notes (Signed)
Anesthesia Chart Review:  Pt is 69 year old female scheduled for L2-3 PLIF on 04/05/2015 with Dr. Annette Stable.   Cardiologist is Dr. Grayland Jack, last office visit 03/30/15 during which pt was cleared for upcoming surgery.  PCP is Dr. [name].   PMH includes: CAD (MI 1998, 2003; stent to LAD, PTCA and stent x2 to RCA), chronic atrial fibrillation, pacemaker ([employer], last interrogated 03/18/15), CHF, stroke (2005, 2008), TIA, HTN, hyperlipidemia, OSA. Former smoker. BMI 42.  Medications include: ASA, lipitor, pradaxa, diltiazem, lasix, losartan, metoprolol, potassium, tikosyn. Pt to stop pradaxa after 04/01/15 dose.   Preoperative labs reviewed.   EKG 03/30/2015: NSR. Nonspecific ST abnormality.  Cardiac cath 10/06/2013:  -Preserved global LV function the estimated ejection fraction of 55% and evidence for a small area of mild mid-distal inferior hypocontractility -No significant residual coronary obstructive disease with the appearance of a patent stent in the LAD, normal left circumflex artery, and patent tandem stents in the RCA extending from the ostium to the mid segment. There is faint collateralization of a small RV marginal branch which is supplied via septal collaterals arising from the LAD. -Moderate pulmonary hypertension following recent aggressive diuresis with a PA systolic pressure of 57 mm.  Echo 10/03/2013:  - Left ventricle: The cavity size was normal. Systolic function was normal. The estimated ejection fraction was in the range of 55% to 60%. Wall motion was normal; there were no regional wall motion abnormalities. Features are consistent with a pseudonormal left ventricular filling pattern, with concomitant abnormal relaxation and increased filling pressure (grade 2 diastolic dysfunction). - Mitral valve: Calcified annulus. Mild regurgitation. - Left atrium: The atrium was mildly dilated. - Tricuspid valve: Moderate regurgitation. - Pulmonary arteries: Systolic pressure was  moderately to severely increased. PA peak pressure: 96mm Hg (S).  Perioperative prescription form notes procedure should not interfere with device function, no device programming or magnet placement needed.   If no changes, I anticipate pt can proceed with surgery as scheduled.   Willeen Cass, FNP-BC [facility]/Anesthesiology Phone: [phone] 03/31/2015 2:50 PM

## 2015-04-04 MED ORDER — DEXAMETHASONE SODIUM PHOSPHATE 10 MG/ML IJ SOLN
10.0000 mg | INTRAMUSCULAR | Status: AC
Start: 2015-04-05 — End: 2015-04-05
  Administered 2015-04-05: 10 mg via INTRAVENOUS
  Administered 2015-04-05 (×2): 5 mg via INTRAVENOUS
  Filled 2015-04-04: qty 1

## 2015-04-04 MED ORDER — DEXTROSE 5 % IV SOLN
3.0000 g | INTRAVENOUS | Status: AC
  Administered 2015-04-05: 3 g via INTRAVENOUS
  Filled 2015-04-04 (×3): qty 3000

## 2015-04-05 ENCOUNTER — Inpatient Hospital Stay (HOSPITAL_COMMUNITY): Payer: PPO | Admitting: Anesthesiology

## 2015-04-05 ENCOUNTER — Inpatient Hospital Stay (HOSPITAL_COMMUNITY)
Admission: RE | Admit: 2015-04-05 | Discharge: 2015-04-06 | DRG: 460 | Disposition: A | Discharge status: Home / Self Care | Payer: PPO | Source: Ambulatory Visit | Attending: Neurosurgery | Admitting: Neurosurgery

## 2015-04-05 ENCOUNTER — Inpatient Hospital Stay (HOSPITAL_COMMUNITY): Payer: PPO | Admitting: Vascular Surgery

## 2015-04-05 ENCOUNTER — Inpatient Hospital Stay (HOSPITAL_COMMUNITY): Payer: PPO

## 2015-04-05 ENCOUNTER — Encounter (HOSPITAL_COMMUNITY)
Admission: RE | Disposition: A | Discharge status: Home / Self Care | Payer: PPO | Source: Ambulatory Visit | Attending: Neurosurgery

## 2015-04-05 ENCOUNTER — Encounter (HOSPITAL_COMMUNITY): Payer: Self-pay | Admitting: General Practice

## 2015-04-05 DIAGNOSIS — I2510 Atherosclerotic heart disease of native coronary artery without angina pectoris: Secondary | ICD-10-CM | POA: Diagnosis not present

## 2015-04-05 DIAGNOSIS — M47896 Other spondylosis, lumbar region: Secondary | ICD-10-CM | POA: Diagnosis present

## 2015-04-05 DIAGNOSIS — Z8673 Personal history of transient ischemic attack (TIA), and cerebral infarction without residual deficits: Secondary | ICD-10-CM

## 2015-04-05 DIAGNOSIS — E785 Hyperlipidemia, unspecified: Secondary | ICD-10-CM | POA: Diagnosis present

## 2015-04-05 DIAGNOSIS — G4733 Obstructive sleep apnea (adult) (pediatric): Secondary | ICD-10-CM | POA: Diagnosis present

## 2015-04-05 DIAGNOSIS — Z950 Presence of cardiac pacemaker: Secondary | ICD-10-CM

## 2015-04-05 DIAGNOSIS — I130 Hypertensive heart and chronic kidney disease with heart failure and stage 1 through stage 4 chronic kidney disease, or unspecified chronic kidney disease: Secondary | ICD-10-CM | POA: Diagnosis present

## 2015-04-05 DIAGNOSIS — M51369 Other intervertebral disc degeneration, lumbar region without mention of lumbar back pain or lower extremity pain: Secondary | ICD-10-CM | POA: Diagnosis present

## 2015-04-05 DIAGNOSIS — Z79899 Other long term (current) drug therapy: Secondary | ICD-10-CM | POA: Diagnosis not present

## 2015-04-05 DIAGNOSIS — I482 Chronic atrial fibrillation: Secondary | ICD-10-CM | POA: Diagnosis present

## 2015-04-05 DIAGNOSIS — F419 Anxiety disorder, unspecified: Secondary | ICD-10-CM | POA: Diagnosis present

## 2015-04-05 DIAGNOSIS — I509 Heart failure, unspecified: Secondary | ICD-10-CM | POA: Diagnosis present

## 2015-04-05 DIAGNOSIS — M5136 Other intervertebral disc degeneration, lumbar region: Secondary | ICD-10-CM | POA: Diagnosis present

## 2015-04-05 DIAGNOSIS — Z8249 Family history of ischemic heart disease and other diseases of the circulatory system: Secondary | ICD-10-CM

## 2015-04-05 DIAGNOSIS — M545 Low back pain: Secondary | ICD-10-CM | POA: Diagnosis present

## 2015-04-05 DIAGNOSIS — I252 Old myocardial infarction: Secondary | ICD-10-CM

## 2015-04-05 DIAGNOSIS — Z7982 Long term (current) use of aspirin: Secondary | ICD-10-CM

## 2015-04-05 DIAGNOSIS — M549 Dorsalgia, unspecified: Secondary | ICD-10-CM

## 2015-04-05 DIAGNOSIS — Z955 Presence of coronary angioplasty implant and graft: Secondary | ICD-10-CM

## 2015-04-05 DIAGNOSIS — N189 Chronic kidney disease, unspecified: Secondary | ICD-10-CM | POA: Diagnosis present

## 2015-04-05 DIAGNOSIS — Z833 Family history of diabetes mellitus: Secondary | ICD-10-CM

## 2015-04-05 DIAGNOSIS — M4806 Spinal stenosis, lumbar region: Secondary | ICD-10-CM | POA: Diagnosis present

## 2015-04-05 DIAGNOSIS — Z87891 Personal history of nicotine dependence: Secondary | ICD-10-CM | POA: Diagnosis not present

## 2015-04-05 LAB — APTT: aPTT: 28 seconds (ref 24–37)

## 2015-04-05 SURGERY — POSTERIOR LUMBAR FUSION 1 LEVEL
Anesthesia: General | Site: Back

## 2015-04-05 MED ORDER — ARTIFICIAL TEARS OP OINT
TOPICAL_OINTMENT | OPHTHALMIC | Status: AC
  Filled 2015-04-05: qty 3.5

## 2015-04-05 MED ORDER — LIDOCAINE HCL (CARDIAC) 20 MG/ML IV SOLN
INTRAVENOUS | Status: AC
  Filled 2015-04-05: qty 5

## 2015-04-05 MED ORDER — LYSINE HCL 1000 MG PO TABS: 1.0000 | ORAL_TABLET | Freq: Every day | ORAL | Status: DC | PRN

## 2015-04-05 MED ORDER — POTASSIUM CHLORIDE CRYS ER 20 MEQ PO TBCR
20.0000 meq | EXTENDED_RELEASE_TABLET | Freq: Two times a day (BID) | ORAL | Status: DC
  Administered 2015-04-05: 20 meq via ORAL
  Filled 2015-04-05 (×3): qty 1

## 2015-04-05 MED ORDER — DOFETILIDE 250 MCG PO CAPS
250.0000 ug | ORAL_CAPSULE | Freq: Two times a day (BID) | ORAL | Status: DC
  Administered 2015-04-05 – 2015-04-06 (×3): 250 ug via ORAL
  Filled 2015-04-05 (×5): qty 1

## 2015-04-05 MED ORDER — ONDANSETRON HCL 4 MG/2ML IJ SOLN: 4.0000 mg | INTRAMUSCULAR | Status: DC | PRN

## 2015-04-05 MED ORDER — STERILE WATER FOR INJECTION IJ SOLN
INTRAMUSCULAR | Status: AC
  Filled 2015-04-05: qty 10

## 2015-04-05 MED ORDER — GLYCOPYRROLATE 0.2 MG/ML IJ SOLN
INTRAMUSCULAR | Status: DC | PRN
  Administered 2015-04-05: .8 mg via INTRAVENOUS

## 2015-04-05 MED ORDER — VECURONIUM BROMIDE 10 MG IV SOLR
INTRAVENOUS | Status: AC
  Filled 2015-04-05: qty 10

## 2015-04-05 MED ORDER — SODIUM CHLORIDE 0.9 % IJ SOLN
INTRAMUSCULAR | Status: AC
  Filled 2015-04-05: qty 20

## 2015-04-05 MED ORDER — PHENYLEPHRINE HCL 10 MG/ML IJ SOLN
INTRAMUSCULAR | Status: DC | PRN
  Administered 2015-04-05 (×3): 40 ug via INTRAVENOUS

## 2015-04-05 MED ORDER — SODIUM CHLORIDE 0.9 % IJ SOLN: 3.0000 mL | INTRAMUSCULAR | Status: DC | PRN

## 2015-04-05 MED ORDER — FLUOXETINE HCL 20 MG PO CAPS
20.0000 mg | ORAL_CAPSULE | Freq: Every morning | ORAL | Status: DC
  Filled 2015-04-05: qty 1

## 2015-04-05 MED ORDER — METOPROLOL SUCCINATE ER 25 MG PO TB24: 100.0000 mg | ORAL_TABLET | Freq: Every day | ORAL | Status: DC

## 2015-04-05 MED ORDER — LIDOCAINE HCL (CARDIAC) 20 MG/ML IV SOLN
INTRAVENOUS | Status: DC | PRN
  Administered 2015-04-05: 75 mg via INTRAVENOUS

## 2015-04-05 MED ORDER — PROMETHAZINE HCL 25 MG/ML IJ SOLN: 6.2500 mg | INTRAMUSCULAR | Status: DC | PRN

## 2015-04-05 MED ORDER — OXYCODONE-ACETAMINOPHEN 5-325 MG PO TABS
1.0000 | ORAL_TABLET | ORAL | Status: DC | PRN
  Administered 2015-04-05 – 2015-04-06 (×3): 2 via ORAL
  Filled 2015-04-05 (×4): qty 2

## 2015-04-05 MED ORDER — SODIUM CHLORIDE 0.9 % IJ SOLN
3.0000 mL | Freq: Two times a day (BID) | INTRAMUSCULAR | Status: DC
  Administered 2015-04-05: 3 mL via INTRAVENOUS

## 2015-04-05 MED ORDER — ASPIRIN EC 81 MG PO TBEC
81.0000 mg | DELAYED_RELEASE_TABLET | Freq: Every day | ORAL | Status: DC
  Administered 2015-04-05: 81 mg via ORAL
  Filled 2015-04-05: qty 1

## 2015-04-05 MED ORDER — EPHEDRINE SULFATE 50 MG/ML IJ SOLN
INTRAMUSCULAR | Status: DC | PRN
  Administered 2015-04-05 (×2): 10 mg via INTRAVENOUS

## 2015-04-05 MED ORDER — ATORVASTATIN CALCIUM 20 MG PO TABS
40.0000 mg | ORAL_TABLET | Freq: Every day | ORAL | Status: DC
  Administered 2015-04-05: 40 mg via ORAL
  Filled 2015-04-05: qty 2

## 2015-04-05 MED ORDER — SODIUM CHLORIDE 0.9 % IV SOLN: 250.0000 mL | INTRAVENOUS | Status: DC

## 2015-04-05 MED ORDER — ARTIFICIAL TEARS OP OINT
TOPICAL_OINTMENT | OPHTHALMIC | Status: DC | PRN
  Administered 2015-04-05: 1 via OPHTHALMIC

## 2015-04-05 MED ORDER — NEOSTIGMINE METHYLSULFATE 10 MG/10ML IV SOLN
INTRAVENOUS | Status: DC | PRN
  Administered 2015-04-05: 4 mg via INTRAVENOUS

## 2015-04-05 MED ORDER — EPHEDRINE SULFATE 50 MG/ML IJ SOLN
INTRAMUSCULAR | Status: AC
  Filled 2015-04-05: qty 1

## 2015-04-05 MED ORDER — NEOSTIGMINE METHYLSULFATE 10 MG/10ML IV SOLN
INTRAVENOUS | Status: AC
  Filled 2015-04-05: qty 1

## 2015-04-05 MED ORDER — SUCCINYLCHOLINE CHLORIDE 20 MG/ML IJ SOLN
INTRAMUSCULAR | Status: AC
  Filled 2015-04-05: qty 1

## 2015-04-05 MED ORDER — VECURONIUM BROMIDE 10 MG IV SOLR
INTRAVENOUS | Status: DC | PRN
  Administered 2015-04-05 (×2): 1 mg via INTRAVENOUS

## 2015-04-05 MED ORDER — ONE-DAILY MULTI VITAMINS PO TABS: 1.0000 | ORAL_TABLET | Freq: Every day | ORAL | Status: DC

## 2015-04-05 MED ORDER — PHENOL 1.4 % MT LIQD
1.0000 | OROMUCOSAL | Status: DC | PRN
  Filled 2015-04-05: qty 177

## 2015-04-05 MED ORDER — LORATADINE 10 MG PO TABS: 10.0000 mg | ORAL_TABLET | Freq: Every day | ORAL | Status: DC

## 2015-04-05 MED ORDER — FUROSEMIDE 40 MG PO TABS
40.0000 mg | ORAL_TABLET | Freq: Two times a day (BID) | ORAL | Status: DC
  Administered 2015-04-05 – 2015-04-06 (×2): 40 mg via ORAL
  Filled 2015-04-05 (×2): qty 1

## 2015-04-05 MED ORDER — GLYCOPYRROLATE 0.2 MG/ML IJ SOLN
INTRAMUSCULAR | Status: AC
  Filled 2015-04-05: qty 2

## 2015-04-05 MED ORDER — HYDROCODONE-ACETAMINOPHEN 5-325 MG PO TABS
1.0000 | ORAL_TABLET | ORAL | Status: DC | PRN
Start: 2015-04-05 — End: 2015-04-06

## 2015-04-05 MED ORDER — THROMBIN 20000 UNITS EX SOLR
CUTANEOUS | Status: DC | PRN
  Administered 2015-04-05: 20 mL via TOPICAL

## 2015-04-05 MED ORDER — ROCURONIUM BROMIDE 50 MG/5ML IV SOLN
INTRAVENOUS | Status: AC
  Filled 2015-04-05: qty 1

## 2015-04-05 MED ORDER — ACETAMINOPHEN 650 MG RE SUPP: 650.0000 mg | RECTAL | Status: DC | PRN

## 2015-04-05 MED ORDER — INFLUENZA VAC SPLIT QUAD 0.5 ML IM SUSY
0.5000 mL | PREFILLED_SYRINGE | INTRAMUSCULAR | Status: AC
  Administered 2015-04-06: 0.5 mL via INTRAMUSCULAR
  Filled 2015-04-05: qty 0.5

## 2015-04-05 MED ORDER — BUPIVACAINE HCL (PF) 0.25 % IJ SOLN
INTRAMUSCULAR | Status: DC | PRN
  Administered 2015-04-05: 20 mL

## 2015-04-05 MED ORDER — SODIUM CHLORIDE 0.9 % IR SOLN
Status: DC | PRN
  Administered 2015-04-05: 500 mL

## 2015-04-05 MED ORDER — MENTHOL 3 MG MT LOZG
1.0000 | LOZENGE | OROMUCOSAL | Status: DC | PRN
  Filled 2015-04-05: qty 9

## 2015-04-05 MED ORDER — VITAMIN E 45 MG (100 UNIT) PO CAPS: 1000.0000 [IU] | ORAL_CAPSULE | Freq: Every day | ORAL | Status: DC

## 2015-04-05 MED ORDER — CEFAZOLIN SODIUM 1-5 GM-% IV SOLN
1.0000 g | Freq: Three times a day (TID) | INTRAVENOUS | Status: AC
  Administered 2015-04-05 (×2): 1 g via INTRAVENOUS
  Filled 2015-04-05 (×2): qty 50

## 2015-04-05 MED ORDER — LOSARTAN POTASSIUM 50 MG PO TABS
100.0000 mg | ORAL_TABLET | Freq: Every day | ORAL | Status: DC
  Administered 2015-04-05: 100 mg via ORAL
  Filled 2015-04-05: qty 2

## 2015-04-05 MED ORDER — VANCOMYCIN HCL 1000 MG IV SOLR
INTRAVENOUS | Status: DC | PRN
  Administered 2015-04-05: 1000 mg

## 2015-04-05 MED ORDER — 0.9 % SODIUM CHLORIDE (POUR BTL) OPTIME
TOPICAL | Status: DC | PRN
  Administered 2015-04-05: 1000 mL

## 2015-04-05 MED ORDER — HYDROMORPHONE HCL 1 MG/ML IJ SOLN
INTRAMUSCULAR | Status: AC
  Filled 2015-04-05: qty 1

## 2015-04-05 MED ORDER — DIAZEPAM 5 MG PO TABS: 5.0000 mg | ORAL_TABLET | Freq: Four times a day (QID) | ORAL | Status: DC | PRN

## 2015-04-05 MED ORDER — B COMPLEX PO TABS: 1.0000 | ORAL_TABLET | Freq: Every day | ORAL | Status: DC

## 2015-04-05 MED ORDER — ONDANSETRON HCL 4 MG/2ML IJ SOLN
INTRAMUSCULAR | Status: DC | PRN
Start: 2015-04-05 — End: 2015-04-05
  Administered 2015-04-05: 4 mg via INTRAVENOUS

## 2015-04-05 MED ORDER — FENTANYL CITRATE (PF) 100 MCG/2ML IJ SOLN
INTRAMUSCULAR | Status: DC | PRN
  Administered 2015-04-05: 50 ug via INTRAVENOUS
  Administered 2015-04-05: 100 ug via INTRAVENOUS

## 2015-04-05 MED ORDER — VITAMIN D-3 125 MCG (5000 UT) PO TABS: 5000.0000 [IU] | ORAL_TABLET | Freq: Every morning | ORAL | Status: DC

## 2015-04-05 MED ORDER — DILTIAZEM HCL ER COATED BEADS 180 MG PO CP24
180.0000 mg | ORAL_CAPSULE | Freq: Every day | ORAL | Status: DC
  Filled 2015-04-05: qty 1

## 2015-04-05 MED ORDER — HYDROMORPHONE HCL 1 MG/ML IJ SOLN
0.2500 mg | INTRAMUSCULAR | Status: DC | PRN
  Administered 2015-04-05: 0.5 mg via INTRAVENOUS

## 2015-04-05 MED ORDER — NITROGLYCERIN 0.4 MG SL SUBL: 0.4000 mg | SUBLINGUAL_TABLET | SUBLINGUAL | Status: DC | PRN

## 2015-04-05 MED ORDER — VANCOMYCIN HCL 1000 MG IV SOLR
INTRAVENOUS | Status: AC
  Filled 2015-04-05: qty 1000

## 2015-04-05 MED ORDER — ACETAMINOPHEN 325 MG PO TABS: 650.0000 mg | ORAL_TABLET | ORAL | Status: DC | PRN

## 2015-04-05 MED ORDER — VITAMIN B-12 1000 MCG PO TABS
1000.0000 ug | ORAL_TABLET | Freq: Every day | ORAL | Status: DC
  Filled 2015-04-05: qty 1

## 2015-04-05 MED ORDER — ROCURONIUM BROMIDE 100 MG/10ML IV SOLN
INTRAVENOUS | Status: DC | PRN
  Administered 2015-04-05: 50 mg via INTRAVENOUS

## 2015-04-05 MED ORDER — ONDANSETRON HCL 4 MG/2ML IJ SOLN
INTRAMUSCULAR | Status: AC
  Filled 2015-04-05: qty 2

## 2015-04-05 MED ORDER — PHENYLEPHRINE 40 MCG/ML (10ML) SYRINGE FOR IV PUSH (FOR BLOOD PRESSURE SUPPORT)
PREFILLED_SYRINGE | INTRAVENOUS | Status: AC
  Filled 2015-04-05: qty 10

## 2015-04-05 MED ORDER — HYDROMORPHONE HCL 1 MG/ML IJ SOLN
0.5000 mg | INTRAMUSCULAR | Status: DC | PRN
  Administered 2015-04-05: 1 mg via INTRAVENOUS

## 2015-04-05 MED ORDER — LACTATED RINGERS IV SOLN
INTRAVENOUS | Status: DC | PRN
  Administered 2015-04-05 (×2): via INTRAVENOUS

## 2015-04-05 MED ORDER — PROPOFOL 10 MG/ML IV BOLUS
INTRAVENOUS | Status: DC | PRN
  Administered 2015-04-05: 125 mg via INTRAVENOUS

## 2015-04-05 MED ORDER — FENTANYL CITRATE (PF) 250 MCG/5ML IJ SOLN
INTRAMUSCULAR | Status: AC
  Filled 2015-04-05: qty 5

## 2015-04-05 MED ORDER — PROPOFOL 10 MG/ML IV BOLUS
INTRAVENOUS | Status: AC
  Filled 2015-04-05: qty 20

## 2015-04-05 SURGICAL SUPPLY — 56 items
APL SKNCLS STERI-STRIP NONHPOA (GAUZE/BANDAGES/DRESSINGS) ×1
BAG DECANTER FOR FLEXI CONT (MISCELLANEOUS) ×3 IMPLANT
BENZOIN TINCTURE PRP APPL 2/3 (GAUZE/BANDAGES/DRESSINGS) ×3 IMPLANT
BLADE CLIPPER SURG (BLADE) IMPLANT
BRUSH SCRUB EZ PLAIN DRY (MISCELLANEOUS) ×3 IMPLANT
BUR CUTTER 7.0 ROUND (BURR) ×3 IMPLANT
BUR MATCHSTICK NEURO 3.0 LAGG (BURR) ×3 IMPLANT
CAGE CAPSTONE 10X22X6 (Cage) ×4 IMPLANT
CANISTER SUCT 3000ML PPV (MISCELLANEOUS) ×3 IMPLANT
CAP LOCKING 90D (Cap) ×4 IMPLANT
CLOSURE WOUND 1/2 X4 (GAUZE/BANDAGES/DRESSINGS) ×2
CONT SPEC 4OZ CLIKSEAL STRL BL (MISCELLANEOUS) ×3 IMPLANT
COVER BACK TABLE 60X90IN (DRAPES) ×3 IMPLANT
DECANTER SPIKE VIAL GLASS SM (MISCELLANEOUS) ×3 IMPLANT
DRAPE C-ARM 42X72 X-RAY (DRAPES) ×6 IMPLANT
DRAPE LAPAROTOMY 100X72X124 (DRAPES) ×3 IMPLANT
DRAPE POUCH INSTRU U-SHP 10X18 (DRAPES) ×3 IMPLANT
DRAPE PROXIMA HALF (DRAPES) IMPLANT
DRAPE SURG 17X23 STRL (DRAPES) ×12 IMPLANT
DRSG OPSITE POSTOP 4X6 (GAUZE/BANDAGES/DRESSINGS) ×2 IMPLANT
DURAPREP 26ML APPLICATOR (WOUND CARE) ×3 IMPLANT
ELECT REM PT RETURN 9FT ADLT (ELECTROSURGICAL) ×3
ELECTRODE REM PT RTRN 9FT ADLT (ELECTROSURGICAL) ×1 IMPLANT
EVACUATOR 1/8 PVC DRAIN (DRAIN) ×3 IMPLANT
GAUZE SPONGE 4X4 12PLY STRL (GAUZE/BANDAGES/DRESSINGS) ×3 IMPLANT
GAUZE SPONGE 4X4 16PLY XRAY LF (GAUZE/BANDAGES/DRESSINGS) IMPLANT
GLOVE ECLIPSE 9.0 STRL (GLOVE) ×6 IMPLANT
GLOVE EXAM NITRILE LRG STRL (GLOVE) IMPLANT
GLOVE EXAM NITRILE MD LF STRL (GLOVE) IMPLANT
GLOVE EXAM NITRILE XL STR (GLOVE) IMPLANT
GLOVE EXAM NITRILE XS STR PU (GLOVE) IMPLANT
GOWN STRL REUS W/ TWL LRG LVL3 (GOWN DISPOSABLE) IMPLANT
GOWN STRL REUS W/ TWL XL LVL3 (GOWN DISPOSABLE) ×2 IMPLANT
GOWN STRL REUS W/TWL 2XL LVL3 (GOWN DISPOSABLE) IMPLANT
GOWN STRL REUS W/TWL LRG LVL3 (GOWN DISPOSABLE)
GOWN STRL REUS W/TWL XL LVL3 (GOWN DISPOSABLE) ×6
KIT BASIN OR (CUSTOM PROCEDURE TRAY) ×3 IMPLANT
KIT ROOM TURNOVER OR (KITS) ×3 IMPLANT
LIQUID BAND (GAUZE/BANDAGES/DRESSINGS) ×3 IMPLANT
NEEDLE HYPO 22GX1.5 SAFETY (NEEDLE) ×3 IMPLANT
NS IRRIG 1000ML POUR BTL (IV SOLUTION) ×3 IMPLANT
PACK LAMINECTOMY NEURO (CUSTOM PROCEDURE TRAY) ×3 IMPLANT
ROD RADIUS 45MM (Rod) ×6 IMPLANT
ROD SPNL 45X5.5XNS TI RDS (Rod) IMPLANT
SCREW 6.75X40MM (Screw) ×4 IMPLANT
SPONGE SURGIFOAM ABS GEL 100 (HEMOSTASIS) ×3 IMPLANT
STRIP CLOSURE SKIN 1/2X4 (GAUZE/BANDAGES/DRESSINGS) ×4 IMPLANT
SUT VIC AB 0 CT1 18XCR BRD8 (SUTURE) ×2 IMPLANT
SUT VIC AB 0 CT1 8-18 (SUTURE) ×6
SUT VIC AB 2-0 CT1 18 (SUTURE) ×3 IMPLANT
SUT VIC AB 3-0 SH 8-18 (SUTURE) ×6 IMPLANT
TAPE STRIPS DRAPE STRL (GAUZE/BANDAGES/DRESSINGS) ×2 IMPLANT
TOWEL OR 17X24 6PK STRL BLUE (TOWEL DISPOSABLE) ×3 IMPLANT
TOWEL OR 17X26 10 PK STRL BLUE (TOWEL DISPOSABLE) ×3 IMPLANT
TRAY FOLEY W/METER SILVER 14FR (SET/KITS/TRAYS/PACK) ×3 IMPLANT
WATER STERILE IRR 1000ML POUR (IV SOLUTION) ×3 IMPLANT

## 2015-04-05 NOTE — Brief Op Note (Signed)
04/05/2015  10:15 AM  PATIENT:  [name]  69 y.o. female  PRE-OPERATIVE DIAGNOSIS:  Spondylolisthesis  POST-OPERATIVE DIAGNOSIS:  Spondylolisthesis  PROCEDURE:  Procedure(s) with comments: POSTERIOR LUMBAR FUSION 1 LEVEL (N/A) - POSTERIOR LUMBAR FUSION 1 LEVEL LUMBAR 2-3  SURGEON:  Surgeon(s) and Role:    * Earnie Larsson, MD - Primary    * Kary Kos, MD - Assisting  PHYSICIAN ASSISTANT:   ASSISTANTS:    ANESTHESIA:   general  EBL:  Total I/O In: 1550 [I.V.:1500; IV Piggyback:50] Out: 450 [Urine:200; Blood:250]  BLOOD ADMINISTERED:none  DRAINS: none   LOCAL MEDICATIONS USED:  MARCAINE     SPECIMEN:  No Specimen  DISPOSITION OF SPECIMEN:  N/A  COUNTS:  YES  TOURNIQUET:  * No tourniquets in log *  DICTATION: .Dragon Dictation  PLAN OF CARE: Admit to inpatient   PATIENT DISPOSITION:  PACU - hemodynamically stable.   Delay start of Pharmacological VTE agent (>24hrs) due to surgical blood loss or risk of bleeding: yes

## 2015-04-05 NOTE — H&P (Signed)
[name] is an 69 y.o. female.   Chief Complaint: Back pain HPI: 69 year old female status post previous L3-4 and L4-5 decompression and fusions with good results presents with progressive back and bilateral lower extremity pain. Workup demonstrates evidence of progressive adjacent level disease with stenosis at L2-3. Patient has failed conservative management, and wishes to proceed with L2-3 decompression infusion in hopes of improving her symptoms.  Past Medical History  Diagnosis Date  . History of CVA (cerebrovascular accident)   . CAD (coronary artery disease) [id number]    post PTCA with bare-metal stenting to mid RCA in December 2004     . Hypertension   . Chronic atrial fibrillation ([place]) 06/2007    Tachybradycardia pacemaker  . Pacemaker   . Anticoagulant long-term use     pradaxa  . Transfusion history   . Hyperlipidemia   . Edema of lower extremity   . History of acute inferior wall MI   . Anxiety   . Diplopia 06/19/2008    Qualifier: Diagnosis of  By: [name] MD, [name]   . Unspecified hemorrhoids without mention of complication [id number]    Colonoscopy--Dr. Carlean Purl   . Dysrhythmia     ATRIAL FIBRILATION  . TIA (transient ischemic attack)   . CHF (congestive heart failure) ([place])   . Shortness of breath   . Myocardial infarction ([place]) [id number]  . Stroke ([place]) [id number]  . Pneumonia 2014    tx. ----  [facility]  . Depression   . Chronic kidney disease     10% function - ?R, other kidney is compensating    . Arthritis     low back & both knees, Spondylolisthesis    . OSA on CPAP     last test- 2010    Past Surgical History  Procedure Laterality Date  . Permanent pacemaker  06/2007  . Back surgeries      x2-3 by Dr Trenton Gammon  . Coronary stent placement      C stent  . Coronary angioplasty with stent placement  1998  . Doppler echocardiography  2009  . Back surgery    . Left and right heart catheterization with coronary angiogram N/A 10/06/2013   Procedure: LEFT AND RIGHT HEART CATHETERIZATION WITH CORONARY ANGIOGRAM;  Surgeon: Troy Sine, MD;  Location: [facility] CATH LAB;  Service: Cardiovascular;  Laterality: N/A;  . Total abdominal hysterectomy  1984    2 surgeries endometriosis bso  . Cholecystectomy  1994  . Appendectomy      Family History  Problem Relation Age of Onset  . Suicidality Father     suicide death pt was 3 yrs  . Arrhythmia Mother     heart arrthymia  . Hypertension Mother   . Heart attack Brother     died age 8  . Heart disease Paternal Aunt   . Prostate cancer Maternal Grandfather   . Diabetes Paternal Grandfather     fathers side of the family  . Diabetes Mother    Social History:  reports that she quit smoking about 32 years ago. Her smoking use included Cigarettes. She started smoking about 36 years ago. She has a 5 pack-year smoking history. She has never used smokeless tobacco. She reports that she drinks alcohol. She reports that she does not use illicit drugs.  Allergies: No Known Allergies  Medications Prior to Admission  Medication Sig Dispense Refill  . aspirin EC 81 MG tablet Take 81 mg by mouth daily.    [name] atorvastatin (  LIPITOR) 40 MG tablet TAKE 1 TABLET BY MOUTH EVERY DAY (Patient taking differently: TAKE 1 TABLET BY MOUTH EVERY DAY    in the a.m.) 30 tablet 0  . b complex vitamins tablet Take 1 tablet by mouth daily.    . cetirizine (ZYRTEC) 10 MG tablet Take 10 mg by mouth daily.    . Cholecalciferol (VITAMIN D-3) 5000 UNITS TABS Take 5,000 Units by mouth every morning.    . dabigatran (PRADAXA) 150 MG CAPS capsule Take 150 mg by mouth 2 (two) times daily.    [name] diltiazem (CARDIZEM CD) 180 MG 24 hr capsule TAKE 1 CAPSULE (180 MG TOTAL) BY MOUTH DAILY. (Patient taking differently: TAKE 1 CAPSULE (180 MG TOTAL) BY MOUTH DAILY.    in the a.m.) 90 capsule 1  . FLUoxetine (PROZAC) 20 MG capsule TAKE ONE CAPSULE BY MOUTH EVERY MORNING 90 capsule 1  . furosemide (LASIX) 40 MG tablet Take 1 tablet  (40 mg total) by mouth 2 (two) times daily. 180 tablet 3  . losartan (COZAAR) 100 MG tablet TAKE 1 TABLET BY MOUTH EVERY DAY 90 tablet 3  . Lysine HCl 1000 MG TABS Take 1 tablet by mouth daily as needed (for fever blisters).     . metoprolol succinate (TOPROL-XL) 100 MG 24 hr tablet TAKE 1 TABLET BY MOUTH EVERY MORNING & 1/2 TAB IN THE EVENING WITH OR IMMEDIATELY FOLLOWING A MEAL 45 tablet 0  . Multiple Vitamin (MULTIVITAMIN) tablet Take 1 tablet by mouth daily.      . Naproxen Sod-Diphenhydramine (ALEVE PM) 220-25 MG TABS Take 2 tablets by mouth at bedtime.    . naproxen sodium (ANAPROX) 220 MG tablet Take 440 mg by mouth every morning.     . potassium chloride SA (K-DUR,KLOR-CON) 20 MEQ tablet Take 1 tablet (20 mEq total) by mouth 2 (two) times daily. 180 tablet 3  . TIKOSYN 250 MCG capsule TAKE 1 CAPSULE (250 MCG TOTAL) BY MOUTH 2 (TWO) TIMES DAILY. 180 capsule 0  . vitamin B-12 (CYANOCOBALAMIN) 1000 MCG tablet Take 1,000 mcg by mouth daily.    . vitamin E 1000 UNIT capsule Take 1,000 Units by mouth daily.    . nitroGLYCERIN (NITROSTAT) 0.4 MG SL tablet Place 1 tablet (0.4 mg total) under the tongue every 5 (five) minutes as needed. (Patient taking differently: Place 0.4 mg under the tongue every 5 (five) minutes as needed for chest pain. ) 25 tablet 5    No results found for this or any previous visit (from the past 48 hour(s)). No results found.  Pertinent items are noted in HPI.  Blood pressure 153/54, pulse 64, temperature 98.2 F (36.8 C), temperature source Oral, resp. rate 20, height 5' 6.5" (1.689 m), weight 120.203 kg (265 lb), SpO2 96 %.  Patient is awake and alert. She is oriented and appropriate. Speech is fluent. Judgment and insight are intact. Cranial nerve function intact. Motor examination 5/5 bilaterally. No pronator drift. Sensory examination intact. Reflexes hypoactive both lower extremities but symmetric. No evidence for long track signs. Gait somewhat slow and posture  mildly stooped. Examination head ears eyes and throat is unremarkable. Chest and abdomen are benign. Extremities free from injury or deformity. Assessment/Plan L2-3 stenosis, adjacent level degeneration. Plan L2-3 decompressive laminectomy and foraminotomies with posterior lumbar interbody fusion utilizing interbody peek cages, locally harvested autograft, Also with posterior lateral arthrodesis from L2-L3 with local bone graft and pedicle screw instrumentation. Risks and benefits of been explained. Patient wishes to proceed.  Osiel Stick A  04/05/2015, 7:43 AM

## 2015-04-05 NOTE — Anesthesia Procedure Notes (Signed)
Procedure Name: Intubation Date/Time: 04/05/2015 7:55 AM Performed by: Neldon Newport Pre-anesthesia Checklist: Patient being monitored, Suction available, Emergency Drugs available, Patient identified and Timeout performed Patient Re-evaluated:Patient Re-evaluated prior to inductionOxygen Delivery Method: Circle system utilized Preoxygenation: Pre-oxygenation with 100% oxygen Intubation Type: IV induction Ventilation: Mask ventilation without difficulty Laryngoscope Size: Mac and 3 Grade View: Grade II Tube type: Oral Tube size: 7.0 mm Number of attempts: 1 Placement Confirmation: positive ETCO2,  ETT inserted through vocal cords under direct vision and breath sounds checked- equal and bilateral Secured at: 20 cm Tube secured with: Tape Dental Injury: Teeth and Oropharynx as per pre-operative assessment

## 2015-04-05 NOTE — Care Management (Signed)
Utilization review completed. Kennis Buell, RN Case Manager [phone]. 

## 2015-04-05 NOTE — Anesthesia Preprocedure Evaluation (Addendum)
Anesthesia Evaluation  Patient identified by MRN, date of birth, ID band Patient awake    Reviewed: Allergy & Precautions, H&P , NPO status , Patient's Chart, lab work & pertinent test results  Airway Mallampati: III  TM Distance: <3 FB Neck ROM: Full    Dental no notable dental hx. (+) Teeth Intact, Dental Advidsory Given   Pulmonary shortness of breath, sleep apnea , former smoker,    Pulmonary exam normal breath sounds clear to auscultation       Cardiovascular hypertension, Pt. on medications + CAD, + Past MI, + Cardiac Stents and +CHF  Normal cardiovascular exam+ dysrhythmias Atrial Fibrillation + pacemaker  Rhythm:Regular Rate:Normal  Left ventricle: The cavity size was normal. Systolic function was normal. The estimated ejection fraction was in the range of 55% to 60%. Wall motion was normal; there were no regional wall motion abnormalities. Features are consistent with a pseudonormal left ventricular filling pattern, with concomitant abnormal relaxation and increased filling pressure (grade 2 diastolic dysfunction). - Mitral valve: Calcified annulus. Mild regurgitation. - Left atrium: The atrium was mildly dilated. - Tricuspid valve: Moderate regurgitation. - Pulmonary arteries: Systolic pressure was moderately to severely increased. PA peak pressure: 87mm Hg (S). 2015   Neuro/Psych PSYCHIATRIC DISORDERS TIACVA negative psych ROS   GI/Hepatic negative GI ROS, Neg liver ROS,   Endo/Other  negative endocrine ROS  Renal/GU negative Renal ROS  negative genitourinary   Musculoskeletal negative musculoskeletal ROS (+)   Abdominal   Peds negative pediatric ROS (+)  Hematology negative hematology ROS (+)   Anesthesia Other Findings   Reproductive/Obstetrics negative OB ROS                           Anesthesia Physical Anesthesia Plan  ASA: III  Anesthesia Plan:  General   Post-op Pain Management:    Induction: Intravenous  Airway Management Planned: Oral ETT  Additional Equipment:   Intra-op Plan:   Post-operative Plan: Extubation in OR  Informed Consent: I have reviewed the patients History and Physical, chart, labs and discussed the procedure including the risks, benefits and alternatives for the proposed anesthesia with the patient or authorized representative who has indicated his/her understanding and acceptance.   Dental advisory given and Dental Advisory Given  Plan Discussed with: CRNA, Surgeon and Anesthesiologist  Anesthesia Plan Comments:        Anesthesia Quick Evaluation

## 2015-04-05 NOTE — Anesthesia Postprocedure Evaluation (Signed)
  Anesthesia Post-op Note  Patient: [name]  Procedure(s) Performed: Procedure(s) (LRB): POSTERIOR LUMBAR FUSION 1 LEVEL (N/A)  Patient Location: PACU  Anesthesia Type: General  Level of Consciousness: awake and alert   Airway and Oxygen Therapy: Patient Spontanous Breathing  Post-op Pain: mild  Post-op Assessment: Post-op Vital signs reviewed, Patient's Cardiovascular Status Stable, Respiratory Function Stable, Patent Airway and No signs of Nausea or vomiting  Last Vitals:  Filed Vitals:   04/05/15 1138  BP: 135/57  Pulse: 61  Temp: 36.4 C  Resp: 18    Post-op Vital Signs: stable   Complications: No apparent anesthesia complications

## 2015-04-05 NOTE — Evaluation (Signed)
Physical Therapy Evaluation Patient Details Name: [name] MRN: [id number] DOB: 06/29/1946 Today's Date: 04/05/2015   History of Present Illness  69 y.o. s/p POSTERIOR LUMBAR FUSION 1 LEVEL LUMBAR 2-3. History of CVA and previous back surgeries.  Clinical Impression  Patient is s/p above surgery resulting in the deficits listed below (see PT Problem List). Pt tolerated OOB mobility well however requires RW for safe ambulation at this time. Patient will benefit from skilled PT to increase their independence and safety with mobility (while adhering to their precautions) to allow discharge to the venue listed below.     Follow Up Recommendations Home health PT;Supervision/Assistance - 24 hour    Equipment Recommendations  Rolling walker with 5" wheels    Recommendations for Other Services       Precautions / Restrictions Precautions Precautions: Back;Fall Precaution Booklet Issued: Yes (comment) Precaution Comments: pt unable to recall precautions, pt re-educated Required Braces or Orthoses: Spinal Brace Spinal Brace: Lumbar corset;Applied in sitting position (adjusted in standing) Restrictions Weight Bearing Restrictions: No      Mobility  Bed Mobility Overal bed mobility: Needs Assistance Bed Mobility: Rolling;Sidelying to Sit Rolling: Supervision Sidelying to sit: Supervision     Sit to sidelying: Supervision General bed mobility comments: v/c's given pt able to complete with increase time  Transfers Overall transfer level: Needs assistance   Transfers: Sit to/from Stand Sit to Stand: Min assist         General transfer comment: v/c's for hand placement and safety  Ambulation/Gait Ambulation/Gait assistance: Min guard;Min assist Ambulation Distance (Feet): 150 Feet Assistive device: Rolling walker (2 wheeled);1 person hand held assist Gait Pattern/deviations: Step-through pattern;Decreased stride length;Wide base of support Gait velocity: decreased    General Gait Details: pt reports she uses cane at baseline. attempted to amb with L HHA however pt guarded and unsteady and reach for hallway rails. Pt given RW and pt transitioned to min guard and was able to increase cadance and smoothness of gait pattern  Stairs            Wheelchair Mobility    Modified Rankin (Stroke Patients Only)       Balance Overall balance assessment: Needs assistance         Standing balance support: Bilateral upper extremity supported Standing balance-Leahy Scale: Poor Standing balance comment: requires external support                             Pertinent Vitals/Pain Pain Assessment: 0-10 Pain Score: 2  Pain Location: back Pain Descriptors / Indicators: Aching Pain Intervention(s): Monitored during session    Home Living Family/patient expects to be discharged to:: Private residence Living Arrangements: Spouse/significant other Available Help at Discharge: Family;Available 24 hours/day Type of Home: House Home Access: Stairs to enter Entrance Stairs-Rails: None Entrance Stairs-Number of Steps: 1 Home Layout: Two level;Able to live on main level with bedroom/bathroom Home Equipment: Kasandra Knudsen - single point;Adaptive equipment;Shower seat - built in      Prior Function Level of Independence: Independent with assistive device(s)               Hand Dominance   Dominant Hand: Right    Extremity/Trunk Assessment   Upper Extremity Assessment: Overall WFL for tasks assessed           Lower Extremity Assessment: Overall WFL for tasks assessed      Cervical / Trunk Assessment:  (recent back surgery)  Communication  Communication: No difficulties  Cognition Arousal/Alertness: Awake/alert Behavior During Therapy: WFL for tasks assessed/performed Overall Cognitive Status: Within Functional Limits for tasks assessed                      General Comments      Exercises        Assessment/Plan     PT Assessment Patient needs continued PT services  PT Diagnosis Difficulty walking;Acute pain   PT Problem List Decreased strength;Decreased range of motion;Decreased activity tolerance;Decreased balance;Decreased mobility;Decreased knowledge of use of DME  PT Treatment Interventions DME instruction;Gait training;Stair training;Functional mobility training;Therapeutic activities;Therapeutic exercise;Balance training   PT Goals (Current goals can be found in the Care Plan section) Acute Rehab PT Goals Patient Stated Goal: not stated PT Goal Formulation: With patient/family Time For Goal Achievement: 04/12/15 Potential to Achieve Goals: Good    Frequency Min 5X/week   Barriers to discharge        Co-evaluation               End of Session Equipment Utilized During Treatment: Gait belt;Back brace Activity Tolerance: Patient tolerated treatment well Patient left: in chair;with call bell/phone within reach;with family/visitor present Nurse Communication: Mobility status         Time: [id number] PT Time Calculation (min) (ACUTE ONLY): 18 min   Charges:   PT Evaluation $Initial PT Evaluation Tier I: 1 Procedure     PT G CodesKingsley Callander 04/05/2015, 4:26 PM   Kittie Plater, PT, DPT Pager #: [phone] Office #: [phone]'

## 2015-04-05 NOTE — Op Note (Signed)
Date of procedure: 04/05/2015  Date of dictation: Same  Service: Neurosurgery  Preoperative diagnosis: L2-L3 degenerative disc disease with spondylosis and stenosis, status post L3-L5 decompression and fusion.  Postoperative diagnosis: Same  Procedure Name: Bilateral L2-3 decompressive laminectomy with foraminotomies, more than would be required for simple interbody fusion alone.  L2-3 posterior lumbar interbody fusion utilizing interbody peek cages, locally harvested autograft  L2-3 posterior lateral arthrodesis utilizing nonsegmental pedicle screw instrumentation and local autograft.  Reexploration of L3-4 posterior lateral fusion with removal of hardware  Surgeon:Nollie Terlizzi A.Shauntel Prest, M.D.  Asst. Surgeon: Saintclair Halsted  Anesthesia: General  Indication: 69 year old female status post L3-4 and L4-5 decompressions and fusions remotely in the past. She presents now with worsening back and bilateral lower extremity symptoms. Workup demonstrates evidence of severe adjacent level degeneration with retrolisthesis and severe stenosis. Patient presents now for decompression infusion at L2-3. Fusions at L3-4 and L4-5 appears solid.  Operative note: After induction anesthesia, patient position prone onto Wilson frame and a properly padded. Lumbar region prepped and draped. Incision made. Dissection performed. Retractor placed. Fluoroscopy used. Levels confirmed. Previously placed instrumentation L3-4 was disassembled. Fusion was inspected L3-4 and found to be solid. L4 pedicle screws removed. L3 pedicle screws left in place. Redo decompressive laminectomy of L2-3 was then performed using Leksell rongeurs Kerrison rongeurs the high-speed drill to remove the inferior half of the lamina of L2 the entire L2 inferior facet joints bilaterally and the superior facet joints of L3 bilaterally and epidural scar and ligament flavum. Wide decompressive foraminotomies were performed on the course exiting L2 and L3 nerve roots  bilaterally. Bilateral discectomies performed at L2-3. Disc spaces prepared for interbody fusion with various curettes. Disc space distracted up to 10 mm. With a 10 mm distractor left the patient's left side, a 10 mm x 22 mm x 6 capstone cage packed with locally harvested autograft was impacted into place and rotated into final position. Distractor was removed patient's left side. Thecal sac and nerve respect on the left side. Disc space once again prepared for interbody fusion. Morselize autograft packed into the interspace. A second cage was then impacted into place and rotated into final position. Pedicles of L2 were done 5 by surface landmarks and intraoperative fluoroscopy. 2 partial bone overlying the pedicle was removed using high-speed drill. Each pedicles and probed using pedicle awl each pedicle awl track was probed and found to be solidly within the bone. Each pedicle awl tap was then tapped with a screw tap and then 6.75 x 40 mm radius brand screws from [employer] were placed bilaterally at L2. Short segment titanium rods and placed over the screw heads at L2 and L3. Locking caps placed over the screws were locking caps and engaged with the construct under compression. Transverse processes and residual can facet joints were decorticated. Morselize autograft was packed posterior laterally. Gelfoam was placed topically for hemostasis which was found to be good. Vancomycin powder was left in the deep wound space. Wounds and close in layers with Vicryls sutures. Steri-Strips and sterile dressing were applied. Intraoperative x-rays revealed good position of the bone graft hardware at proper upper level with normal alignment of the spine. There were no apparent complications. Patient tolerated the procedure well and she returns to the recovery room postop.

## 2015-04-05 NOTE — Transfer of Care (Signed)
Immediate Anesthesia Transfer of Care Note  Patient: [name]  Procedure(s) Performed: Procedure(s) with comments: POSTERIOR LUMBAR FUSION 1 LEVEL (N/A) - POSTERIOR LUMBAR FUSION 1 LEVEL LUMBAR 2-3  Patient Location: PACU  Anesthesia Type:General  Level of Consciousness: awake, alert  and oriented  Airway & Oxygen Therapy: Patient Spontanous Breathing and Patient connected to face mask oxygen  Post-op Assessment: Report given to RN, Post -op Vital signs reviewed and stable and Patient moving all extremities X 4  Post vital signs: Reviewed and stable  Last Vitals:  Filed Vitals:   04/05/15 0621  BP: 153/54  Pulse: 64  Temp: 36.8 C  Resp: 20    Complications: No apparent anesthesia complications

## 2015-04-05 NOTE — Evaluation (Addendum)
Occupational Therapy Evaluation Patient Details Name: NEREA BETTINGER MRN: [id number] DOB: 09/12/1945 Today's Date: 04/05/2015    History of Present Illness 69 y.o. s/p POSTERIOR LUMBAR FUSION 1 LEVEL LUMBAR 2-3. History of CVA and previous back surgeries.   Clinical Impression   Pt s/p above. Pt independent with ADLs, PTA. Feel pt will benefit from acute OT to increase independence and reinforce back precautions prior to d/c.     Follow Up Recommendations  No OT follow up;Supervision - Intermittent    Equipment Recommendations  Other (comment) (long handled sponge)    Recommendations for Other Services       Precautions / Restrictions Precautions Precautions: Back;Fall Precaution Booklet Issued: Yes (comment) Precaution Comments: educated on back precautions Required Braces or Orthoses: Spinal Brace Spinal Brace: Lumbar corset;Applied in sitting position (adjusted in standing) Restrictions Weight Bearing Restrictions: No      Mobility Bed Mobility Overal bed mobility: Needs Assistance Bed Mobility: Rolling;Sidelying to Sit;Sit to Sidelying Rolling: Supervision Sidelying to sit: Supervision     Sit to sidelying: Supervision    Transfers Overall transfer level: Needs assistance   Transfers: Sit to/from Stand Sit to Stand: Min assist              Balance    Min assist for balance with ambulation. Pt with decreased standing balance.                                        ADL Overall ADL's : Needs assistance/impaired        Eating: Independent; bed level         Upper Body Dressing : Minimal assistance;Sitting Upper Body Dressing Details (indicate cue type and reason): also adjusted brace in standing   Lower Body Dressing Details (indicate cue type and reason): unable to cross legs over knees; familiar with use of reacher for LB dressing Toilet Transfer: Minimal assistance;Ambulation (sit to stand from bed)           Functional  mobility during ADLs: Minimal assistance (hand held assist) General ADL Comments: Discussed incorporating precautions into functional activities. Educated on AE. Instructed in no sleeping in brace and to have clothing on underneath. Educated on safety such as safe footwear, rugs/items on floor, and recommended someone be with her for shower transfer.      Vision     Perception     Praxis      Pertinent Vitals/Pain Pain Assessment: 0-10 Pain Score: 2  Pain Location: back Pain Descriptors / Indicators: Aching Pain Intervention(s): Monitored during session;Limited activity within patient's tolerance     Hand Dominance Right   Extremity/Trunk Assessment Upper Extremity Assessment Upper Extremity Assessment: Overall WFL for tasks assessed   Lower Extremity Assessment Lower Extremity Assessment: Defer to PT evaluation       Communication Communication Communication: No difficulties   Cognition Arousal/Alertness: Awake/alert Behavior During Therapy: WFL for tasks assessed/performed Overall Cognitive Status: Within Functional Limits for tasks assessed                     General Comments       Exercises       Shoulder Instructions      Home Living Family/patient expects to be discharged to:: Private residence Living Arrangements: Spouse/significant other Available Help at Discharge: Family;Available 24 hours/day Type of Home: House Home Access: Stairs to enter [employer] of Steps: 1  Entrance Stairs-Rails: None Home Layout: Two level;Able to live on main level with bedroom/bathroom     Bathroom Shower/Tub: Occupational psychologist: Handicapped height (sink close)     Home Equipment: Cane - single point;Adaptive equipment;Shower seat - built in [employer] Equipment: Reacher;Long-handled Conservation officer, historic buildings        Prior Functioning/Environment Level of Independence: Independent with assistive device(s)             OT Diagnosis: Acute  pain   OT Problem List: Decreased range of motion;Impaired balance (sitting and/or standing);Decreased knowledge of use of DME or AE;Decreased knowledge of precautions;Pain   OT Treatment/Interventions: Self-care/ADL training;DME and/or AE instruction;Therapeutic activities;Balance training;Patient/family education    OT Goals(Current goals can be found in the care plan section) Acute Rehab OT Goals Patient Stated Goal: not stated OT Goal Formulation: With patient Time For Goal Achievement: 04/12/15 Potential to Achieve Goals: Good ADL Goals Pt Will Perform Lower Body Dressing: with set-up;with adaptive equipment;sit to/from stand Pt Will Transfer to Toilet: with supervision;ambulating;grab bars (elevated toilet) Pt Will Perform Toileting - Clothing Manipulation and hygiene: sit to/from stand;with modified independence Pt Will Perform Tub/Shower Transfer: Shower transfer;with supervision;ambulating;shower seat Additional ADL Goal #1: Pt will independently verbalize 3/3 back precautions and maintain during session.   OT Frequency: Min 2X/week   Barriers to D/C:            Co-evaluation              End of Session Equipment Utilized During Treatment: Gait belt;Other (comment) (Oxygen used for part of session) Nurse Communication: Mobility status  Activity Tolerance: Patient tolerated treatment well Patient left: in bed;with call bell/phone within reach;with family/visitor present; SCDs reapplied   Time: [id number] OT Time Calculation (min): 18 min Charges:  OT General Charges $OT Visit: 1 Procedure OT Evaluation $Initial OT Evaluation Tier I: 1 Procedure G-CodesBenito Mccreedy OTR/L [id number] 04/05/2015, 2:02 PM

## 2015-04-06 DIAGNOSIS — M5136 Other intervertebral disc degeneration, lumbar region: Secondary | ICD-10-CM | POA: Diagnosis not present

## 2015-04-06 MED ORDER — DIAZEPAM 5 MG PO TABS: 5.0000 mg | ORAL_TABLET | Freq: Four times a day (QID) | ORAL | Status: DC | PRN

## 2015-04-06 MED ORDER — HYDROCODONE-ACETAMINOPHEN 5-325 MG PO TABS: 1.0000 | ORAL_TABLET | ORAL | Status: DC | PRN

## 2015-04-06 MED FILL — Heparin Sodium (Porcine) Inj 1000 Unit/ML: INTRAMUSCULAR | Qty: 30 | Status: AC

## 2015-04-06 MED FILL — Sodium Chloride IV Soln 0.9%: INTRAVENOUS | Qty: 1000 | Status: AC

## 2015-04-06 NOTE — Discharge Instructions (Signed)
Wound Care Keep incision covered and dry for two days.  If you shower, cover incision with plastic wrap.  Do not put any creams, lotions, or ointments on incision. Leave steri-strips on back.  They will fall off by themselves. Activity Walk each and every day, increasing distance each day. No lifting greater than 5 lbs.  Avoid excessive neck motion. No driving for 2 weeks; may ride as a passenger locally. If provided with back brace, wear when out of bed.  It is not necessary to wear brace in bed. Diet Resume your normal diet.  Return to Work Will be discussed at you follow up appointment. Call Your Doctor If Any of These Occur Redness, drainage, or swelling at the wound.  Temperature greater than 101 degrees. Severe pain not relieved by pain medication. Incision starts to come apart. Follow Up Appt Call today for appointment in 1-2 weeks [id number]) or for problems.  If you have any hardware placed in your spine, you will need an x-ray before your appointment.  Resume Pradaxa on Thursday   Wound Care Keep incision covered and dry for one week.  If you shower prior to then, cover incision with plastic wrap.  You may remove outer bandage after one week and shower.  Do not put any creams, lotions, or ointments on incision. Leave steri-strips on neck.  They will fall off by themselves. Activity Walk each and every day, increasing distance each day. No lifting greater than 5 lbs.  Avoid excessive neck motion. No driving for 2 weeks; may ride as a passenger locally. If provided with back brace, wear when out of bed.  It is not necessary to wear brace in bed. Diet Resume your normal diet.  Return to Work Will be discussed at you follow up appointment. Call Your Doctor If Any of These Occur Redness, drainage, or swelling at the wound.  Temperature greater than 101 degrees. Severe pain not relieved by pain medication. Incision starts to come apart. Follow Up Appt Call today for  appointment in 1-2 weeks [id number]) or for problems.  If you have any hardware placed in your spine, you will need an x-ray before your appointment.

## 2015-04-06 NOTE — Progress Notes (Signed)
Physical Therapy Treatment Patient Details Name: [name] MRN: [id number] DOB: August 25, 1945 Today's Date: 04/06/2015    History of Present Illness 69 y.o. s/p POSTERIOR LUMBAR FUSION 1 LEVEL LUMBAR 2-3. History of CVA and previous back surgeries.    PT Comments    Pt progressing towards physical therapy goals. Was able to perform increased ambulation this session with overall supervision with the RW. Increased assist required for stair negotiation, and pt had near fall upon 1st attempt. On second attempt pt at a min guard level with hands-on guarding for safety. Feel pt will benefit from continued therapy at the outpatient level when appropriate per post-op protocol.   Follow Up Recommendations  Outpatient PT;Supervision for mobility/OOB     Equipment Recommendations  Rolling walker with 5" wheels    Recommendations for Other Services       Precautions / Restrictions Precautions Precautions: Back;Fall Precaution Booklet Issued: Yes (comment) Precaution Comments: pt unable to recall precautions, pt re-educated Required Braces or Orthoses: Spinal Brace Spinal Brace: Lumbar corset;Applied in sitting position (adjusted in standing) Restrictions Weight Bearing Restrictions: No    Mobility  Bed Mobility Overal bed mobility: Needs Assistance Bed Mobility: Rolling;Sidelying to Sit Rolling: Supervision Sidelying to sit: Supervision       General bed mobility comments: HOB flat and railings down. Pt was able to complete with supervision for safety and minimal cueing.   Transfers Overall transfer level: Needs assistance   Transfers: Sit to/from Stand Sit to Stand: Min assist         General transfer comment: VC's for maintaining back precautions.  Ambulation/Gait Ambulation/Gait assistance: Supervision Ambulation Distance (Feet): 400 Feet Assistive device: Rolling walker (2 wheeled);1 person hand held assist Gait Pattern/deviations: Step-through pattern;Decreased  stride length Gait velocity: Decreased Gait velocity interpretation: Below normal speed for age/gender General Gait Details: RW for support. Pt was able to mobilize well in the halls with no rest breaks or reports of increased pain.    Stairs Stairs: Yes Stairs assistance: Max assist (Min guard on second attempt) Stair Management: One rail Right;Forwards;Step to pattern (HHA on L) Number of Stairs: 3 General stair comments: Pt was cued for sequencing and safety awareness with stair training. Pt initially did not step up with her stronger leg, and knee buckled. Max assist was provided for recovery and prevention of fall. Sequencing was reinforced, and on second attempt pt was able to complete 3 steps with hands-on guarding for safety. Pt's husband was educated on safety upon return to the room.   Wheelchair Mobility    Modified Rankin (Stroke Patients Only)       Balance Overall balance assessment: Needs assistance Sitting-balance support: Feet supported;No upper extremity supported Sitting balance-Leahy Scale: Fair     Standing balance support: No upper extremity supported;During functional activity Standing balance-Leahy Scale: Poor                      Cognition Arousal/Alertness: Awake/alert Behavior During Therapy: WFL for tasks assessed/performed Overall Cognitive Status: Within Functional Limits for tasks assessed                      Exercises      General Comments        Pertinent Vitals/Pain Pain Assessment: No/denies pain Pain Location: At rest and after ambulation    Home Living                      Prior Function  PT Goals (current goals can now be found in the care plan section) Acute Rehab PT Goals Patient Stated Goal: not stated PT Goal Formulation: With patient/family Time For Goal Achievement: 04/12/15 Potential to Achieve Goals: Good Progress towards PT goals: Progressing toward goals    Frequency  Min  5X/week    PT Plan Discharge plan needs to be updated    Co-evaluation             End of Session Equipment Utilized During Treatment: Gait belt;Back brace Activity Tolerance: Patient tolerated treatment well Patient left: in chair;with call bell/phone within reach;with family/visitor present     Time: 0731-0752 PT Time Calculation (min) (ACUTE ONLY): 21 min  Charges:  $Gait Training: 8-22 mins                    G Codes:      Rolinda Roan 2015-04-11, 9:08 AM   Rolinda Roan, PT, DPT Acute Rehabilitation Services Pager: [phone]

## 2015-04-06 NOTE — Progress Notes (Signed)
Pt and husband given D/C instructions with Rx's, verbal understanding was provided. Pt's incision is clean and dry with no sign of infection. Pt's IV was removed prior to D/C. [facility] brought RW to Pt as order by MD. Pt D/C'd home via wheelchair @ 1040 per MD order. Pt is stable @ D/C and has no other needs at this time. Holli Humbles, RN

## 2015-04-06 NOTE — Progress Notes (Signed)
   04/06/15 0900  OT Visit Information  Last OT Received On 04/06/15  Assistance Needed +1  History of Present Illness 69 y.o. s/p POSTERIOR LUMBAR FUSION 1 LEVEL LUMBAR 2-3. History of CVA and previous back surgeries.  OT Time Calculation  OT Start Time (ACUTE ONLY) [id number]  OT Stop Time (ACUTE ONLY) 0850  OT Time Calculation (min) 12 min  Precautions  Precautions Back;Fall  Precaution Comments educated pt in back precautions related to ADL and IADL  Required Braces or Orthoses Spinal Brace  Spinal Brace Lumbar corset;Applied in sitting position  Pain Assessment  Pain Assessment No/denies pain  Cognition  Arousal/Alertness Awake/alert  Behavior During Therapy WFL for tasks assessed/performed  Overall Cognitive Status Within Functional Limits for tasks assessed  ADL  Overall ADL's  Needs assistance/impaired  Grooming Wash/dry hands;Supervision/safety;Standing  Grooming Details (indicate cue type and reason) instructed in 2 cup method for brushing teeth  Lower Body Bathing Details (indicate cue type and reason) Pt is aware of long handled bath sponge, was relying on husband prior to admission.  Upper Body Dressing  Set up;Sitting  Lower Body Dressing Min guard;Sit to/from stand  Lower Body Dressing Details (indicate cue type and reason) pt able to perform simulated LB dressing with reacher  Toilet Transfer Minimal assistance;Ambulation  Toileting- Clothing Manipulation and Hygiene Minimal assistance;Sit to/from stand  Toileting - Clothing Manipulation Details (indicate cue type and reason) instructed pt to avoid twisting with pericare and in use of tongs with wet wipes.  Tub/ Fish farm manager guard;Ambulation;Rolling walker  Tub/Shower Transfer Details (indicate cue type and reason) husband will supervise shower transfer  Functional mobility during ADLs Min guard;Rolling walker  General ADL Comments Instructed in avoiding twisting with use of walker.  Bed Mobility  General bed  mobility comments pt in chair  OT - End of Session  Equipment Utilized During Treatment Rolling walker;Gait belt;Back brace  Activity Tolerance Patient tolerated treatment well  Patient left in chair;with call bell/phone within reach;with family/visitor present  OT Assessment/Plan  OT Plan Discharge plan remains appropriate  OT Frequency (ACUTE ONLY) Min 2X/week  Follow Up Recommendations No OT follow up;Supervision - Intermittent  OT Goal Progression  Progress towards OT goals Progressing toward goals  OT General Charges  $OT Visit 1 Procedure  OT Treatments  $Self Care/Home Management  8-22 mins  04/06/2015 Nestor Lewandowsky, OTR/L Pager: [phone]

## 2015-04-06 NOTE — Discharge Summary (Signed)
Physician Discharge Summary  Patient ID: [name] MRN: [id number] DOB/AGE: Jun 05, 1946 69 y.o.  Admit date: 04/05/2015 Discharge date: 04/06/2015  Admission Diagnoses:  Discharge Diagnoses:  Active Problems:   DDD (degenerative disc disease), lumbar   Degenerative disc disease, lumbar   Discharged Condition: good  Hospital Course: The patient was admitted and underwent lumbar decompression and fusion at [id number] without complications. Postoperative she is done very well. Back and lower extremity pain much improved. Standing and ambulating without difficulty. Ready for discharge home.  Consults:   Significant Diagnostic Studies:   Treatments:   Discharge Exam: Blood pressure 133/49, pulse 65, temperature 98.4 F (36.9 C), temperature source Oral, resp. rate 18, height 5' 6.5" (1.689 m), weight 120.203 kg (265 lb), SpO2 95 %. Awake and alert. Oriented and appropriate. Motor and sensory function intact. Wound clean and dry. Chest and abdomen benign.  Disposition: 01-Home or Self Care     Medication List    TAKE these medications        ALEVE PM 220-25 MG Tabs  Generic drug:  Naproxen Sod-Diphenhydramine  Take 2 tablets by mouth at bedtime.     aspirin EC 81 MG tablet  Take 81 mg by mouth daily.     atorvastatin 40 MG tablet  Commonly known as:  LIPITOR  TAKE 1 TABLET BY MOUTH EVERY DAY     b complex vitamins tablet  Take 1 tablet by mouth daily.     cetirizine 10 MG tablet  Commonly known as:  ZYRTEC  Take 10 mg by mouth daily.     dabigatran 150 MG Caps capsule  Commonly known as:  PRADAXA  Take 150 mg by mouth 2 (two) times daily.     diazepam 5 MG tablet  Commonly known as:  VALIUM  Take 1-2 tablets (5-10 mg total) by mouth every 6 (six) hours as needed for muscle spasms.     diltiazem 180 MG 24 hr capsule  Commonly known as:  CARDIZEM CD  TAKE 1 CAPSULE (180 MG TOTAL) BY MOUTH DAILY.     FLUoxetine 20 MG capsule  Commonly known as:  PROZAC  TAKE ONE  CAPSULE BY MOUTH EVERY MORNING     furosemide 40 MG tablet  Commonly known as:  LASIX  Take 1 tablet (40 mg total) by mouth 2 (two) times daily.     HYDROcodone-acetaminophen 5-325 MG tablet  Commonly known as:  NORCO/VICODIN  Take 1-2 tablets by mouth every 4 (four) hours as needed (mild pain).     losartan 100 MG tablet  Commonly known as:  COZAAR  TAKE 1 TABLET BY MOUTH EVERY DAY     Lysine HCl 1000 MG Tabs  Take 1 tablet by mouth daily as needed (for fever blisters).     metoprolol succinate 100 MG 24 hr tablet  Commonly known as:  TOPROL-XL  TAKE 1 TABLET BY MOUTH EVERY MORNING & 1/2 TAB IN THE EVENING WITH OR IMMEDIATELY FOLLOWING A MEAL     multivitamin tablet  Take 1 tablet by mouth daily.     naproxen sodium 220 MG tablet  Commonly known as:  ANAPROX  Take 440 mg by mouth every morning.     nitroGLYCERIN 0.4 MG SL tablet  Commonly known as:  NITROSTAT  Place 1 tablet (0.4 mg total) under the tongue every 5 (five) minutes as needed.     potassium chloride SA 20 MEQ tablet  Commonly known as:  K-DUR,KLOR-CON  Take 1 tablet (20 mEq total)  by mouth 2 (two) times daily.     TIKOSYN 250 MCG capsule  Generic drug:  dofetilide  TAKE 1 CAPSULE (250 MCG TOTAL) BY MOUTH 2 (TWO) TIMES DAILY.     vitamin B-12 1000 MCG tablet  Commonly known as:  CYANOCOBALAMIN  Take 1,000 mcg by mouth daily.     Vitamin D-3 5000 UNITS Tabs  Take 5,000 Units by mouth every morning.     vitamin E 1000 UNIT capsule  Take 1,000 Units by mouth daily.           Follow-up Information    Follow up with Charlie Pitter, MD.   Specialty:  Neurosurgery   Contact information:   [address] [place] [address] [phone]       Signed: Charlie Pitter 04/06/2015, 9:34 AM

## 2015-04-18 ENCOUNTER — Other Ambulatory Visit: Payer: Self-pay | Admitting: Physician Assistant

## 2015-04-20 ENCOUNTER — Encounter: Payer: Self-pay | Admitting: Internal Medicine

## 2015-04-21 ENCOUNTER — Other Ambulatory Visit: Payer: Self-pay | Admitting: Cardiovascular Disease

## 2015-04-28 ENCOUNTER — Other Ambulatory Visit: Payer: Self-pay | Admitting: Cardiovascular Disease

## 2015-05-18 ENCOUNTER — Other Ambulatory Visit: Payer: Self-pay | Admitting: Physician Assistant

## 2015-05-20 NOTE — Telephone Encounter (Signed)
New message      *STAT* If patient is at the pharmacy, call can be transferred to refill team.   1. Which medications need to be refilled? (please list name of each medication and dose if known) klor-con 20 mg   2. Which pharmacy/location (including street and city if local pharmacy) is medication to be sent to? [facility] [address] [phone]   3. Do they need a 30 day or 90 day supply? 90 days supply

## 2015-05-21 ENCOUNTER — Other Ambulatory Visit: Payer: Self-pay | Admitting: Cardiovascular Disease

## 2015-06-01 ENCOUNTER — Other Ambulatory Visit: Payer: Self-pay | Admitting: Cardiovascular Disease

## 2015-06-01 DIAGNOSIS — I495 Sick sinus syndrome: Secondary | ICD-10-CM | POA: Diagnosis not present

## 2015-06-04 ENCOUNTER — Other Ambulatory Visit: Payer: Self-pay | Admitting: Family Medicine

## 2015-06-04 ENCOUNTER — Other Ambulatory Visit (INDEPENDENT_AMBULATORY_CARE_PROVIDER_SITE_OTHER): Payer: PPO

## 2015-06-04 DIAGNOSIS — R7309 Other abnormal glucose: Secondary | ICD-10-CM

## 2015-06-04 DIAGNOSIS — Z0000 Encounter for general adult medical examination without abnormal findings: Secondary | ICD-10-CM | POA: Diagnosis not present

## 2015-06-04 LAB — CBC WITH DIFFERENTIAL/PLATELET
Basophils Absolute: 0.1 10*3/uL (ref 0.0–0.1)
Basophils Relative: 0.7 % (ref 0.0–3.0)
Eosinophils Absolute: 0.5 10*3/uL (ref 0.0–0.7)
Eosinophils Relative: 5.5 % — ABNORMAL HIGH (ref 0.0–5.0)
HCT: 41.9 % (ref 36.0–46.0)
Hemoglobin: 13.8 g/dL (ref 12.0–15.0)
Lymphocytes Relative: 24 % (ref 12.0–46.0)
Lymphs Abs: 2.1 10*3/uL (ref 0.7–4.0)
MCHC: 32.9 g/dL (ref 30.0–36.0)
MCV: 94.4 fl (ref 78.0–100.0)
Monocytes Absolute: 0.9 10*3/uL (ref 0.1–1.0)
Monocytes Relative: 10.2 % (ref 3.0–12.0)
Neutro Abs: 5.1 10*3/uL (ref 1.4–7.7)
Neutrophils Relative %: 59.6 % (ref 43.0–77.0)
Platelets: 274 10*3/uL (ref 150.0–400.0)
RBC: 4.44 Mil/uL (ref 3.87–5.11)
RDW: 13.6 % (ref 11.5–15.5)
WBC: 8.6 10*3/uL (ref 4.0–10.5)

## 2015-06-04 LAB — BASIC METABOLIC PANEL
BUN: 25 mg/dL — ABNORMAL HIGH (ref 6–23)
CO2: 30 mEq/L (ref 19–32)
Calcium: 9.6 mg/dL (ref 8.4–10.5)
Chloride: 102 mEq/L (ref 96–112)
Creatinine, Ser: 1.02 mg/dL (ref 0.40–1.20)
GFR: 57.06 mL/min — ABNORMAL LOW (ref >60.00)
Glucose, Bld: 129 mg/dL — ABNORMAL HIGH (ref 70–99)
Potassium: 5.3 mEq/L — ABNORMAL HIGH (ref 3.5–5.1)
Sodium: 140 mEq/L (ref 135–145)

## 2015-06-04 LAB — LIPID PANEL
Cholesterol: 135 mg/dL (ref 0–200)
HDL: 42 mg/dL (ref >39.00)
LDL Cholesterol: 69 mg/dL (ref 0–99)
NonHDL: 92.98
Total CHOL/HDL Ratio: 3
Triglycerides: 119 mg/dL (ref 0.0–149.0)
VLDL: 23.8 mg/dL (ref 0.0–40.0)

## 2015-06-04 LAB — HEPATIC FUNCTION PANEL
ALT: 22 U/L (ref 0–35)
AST: 17 U/L (ref 0–37)
Albumin: 4 g/dL (ref 3.5–5.2)
Alkaline Phosphatase: 118 U/L — ABNORMAL HIGH (ref 39–117)
Bilirubin, Direct: 0.1 mg/dL (ref 0.0–0.3)
Total Bilirubin: 0.4 mg/dL (ref 0.2–1.2)
Total Protein: 7.1 g/dL (ref 6.0–8.3)

## 2015-06-04 LAB — HEMOGLOBIN A1C: Hgb A1c MFr Bld: 6.5 % (ref 4.6–6.5)

## 2015-06-04 LAB — TSH: TSH: 2.81 u[IU]/mL (ref 0.35–4.50)

## 2015-06-07 ENCOUNTER — Encounter: Payer: Self-pay | Admitting: Internal Medicine

## 2015-06-07 ENCOUNTER — Ambulatory Visit (INDEPENDENT_AMBULATORY_CARE_PROVIDER_SITE_OTHER): Payer: PPO | Admitting: Internal Medicine

## 2015-06-07 VITALS — BP 132/82 | HR 65 | Ht 65.5 in | Wt 260.6 lb

## 2015-06-07 DIAGNOSIS — G4733 Obstructive sleep apnea (adult) (pediatric): Secondary | ICD-10-CM

## 2015-06-07 NOTE — Progress Notes (Signed)
Subjective:     Patient ID: [name], female   DOB: 01/08/1946,    MRN: [id number]  HPI   86 yowf quit smoking in 1984 followed previously by [facility] for osa on CPAP since 2010    06/07/2015 1st  office visit/ Lawayne Hartig  Re osa Chief Complaint  Patient presents with  . Follow-up    Former [place] pt. Pt states that she needs a new CPAP and supplies. Pt states that she uses it every night and sleeps well.   sleeps well on cpap x 6-8 h / night on 13 cm Wakes up feeling refresh, rarely having palpitations, no am ha or grogginess  Not limited by breathing from desired activities  But by back and knee pain   No obvious day to day or daytime variability or assoc chronic cough or cp or chest tightness, subjective wheeze or overt sinus or hb symptoms. No unusual exp hx or h/o childhood pna/ asthma or knowledge of premature birth.  Sleeping ok without nocturnal  or early am exacerbation  of respiratory  c/o's or need for noct saba. Also denies any obvious fluctuation of symptoms with weather or environmental changes or other aggravating or alleviating factors except as outlined above   Current Medications, Allergies, Complete Past Medical History, Past Surgical History, Family History, and Social History were reviewed in [employer] record.  ROS  The following are not active complaints unless bolded sore throat, dysphagia, dental problems, itching, sneezing,  nasal congestion or excess/ purulent secretions, ear ache,   fever, chills, sweats, unintended wt loss, classically pleuritic or exertional cp, hemoptysis,  orthopnea pnd or leg swelling, presyncope, palpitations, abdominal pain, anorexia, nausea, vomiting, diarrhea  or change in bowel or bladder habits, change in stools or urine, dysuria,hematuria,  rash, arthralgias, visual complaints, headache, numbness, weakness or ataxia or problems with walking or coordination,  change in mood/affect or memory.           Review of  Systems     Objective:   Physical Exam     amb obese wf nad  Wt Readings from Last 3 Encounters:  06/07/15 260 lb 9.6 oz (118.207 kg)  04/05/15 265 lb (120.203 kg)  03/30/15 265 lb (120.203 kg)    Vital signs reviewed   HEENT: nl dentition, turbinates, and oropharynx. Nl external ear canals without cough reflex Modified Mallampati Score = 1     NECK :  without JVD/Nodes/TM/ nl carotid upstrokes bilaterally   LUNGS: no acc muscle use,  Nl contour chest which is clear to A and P bilaterally without cough on insp or exp maneuvers   CV:  RRR  no s3 or murmur or increase in P2,  Trace edema/ mild pitting   ABD:  Obese soft and nontender with limited inspiratory excursion in the supine position. No bruits or organomegaly, bowel sounds nl  MS:  Nl gait/ ext warm without deformities, calf tenderness, cyanosis or clubbing No obvious joint restrictions / wearing back brace   SKIN: warm and dry without lesions    NEURO:  alert, approp, nl sensorium with  no motor deficits     Lab Results  Component Value Date   TSH 2.81 06/04/2015         Assessment:

## 2015-06-07 NOTE — Assessment & Plan Note (Signed)
Body mass index is 42.69   Lab Results  Component Value Date   TSH 2.81 06/04/2015     Contributing to  Osa/ hbp/reviewed the need and the process to achieve and maintain neg calorie balance > defer f/u primary care including intermittently monitoring thyroid status

## 2015-06-07 NOTE — Assessment & Plan Note (Addendum)
NPSG 2010:  AHI 21/hr - new machine/equipment and download ordered 06/07/2015 >>>   Appears to be compliant and doing well on her present machine now 46 y old but needs new equipment / download now and establish with sleep medicine   I had an extended discussion with the patient reviewing all relevant studies completed to date and  lasting 15 to 20 minutes of a 25 minute visit    Each maintenance medication was reviewed in detail including most importantly the difference between maintenance and prns and under what circumstances the prns are to be triggered using an action plan format that is not reflected in the computer generated alphabetically organized AVS.    Please see instructions for details which were reviewed in writing and the patient given a copy highlighting the part that I personally wrote and discussed at today's ov.

## 2015-06-07 NOTE — Patient Instructions (Signed)
Please see patient coordinator before you leave today  to schedule new cpap machine/ equipment and download   Follow up will be  6 months with sleep medicine/ first available after 6 months is fine

## 2015-06-10 ENCOUNTER — Telehealth: Payer: Self-pay | Admitting: Internal Medicine

## 2015-06-10 NOTE — Telephone Encounter (Signed)
Spoke with Melissa from [facility]. She does not know what pt machine is currently set on but will see if we can get a download so we can put pressure settings on order. Will await call back

## 2015-06-10 NOTE — Telephone Encounter (Signed)
Spoke with Melissa at [facility], states that pt's most recent pressure documented is 13cm from 2 years ago.  Pt will have to bring in machine for download as her cpap is too old to get a download remotely.  Download will be faxed to our office as soon as [facility] can get it.  Nothing further needed at this time.

## 2015-06-11 ENCOUNTER — Ambulatory Visit (INDEPENDENT_AMBULATORY_CARE_PROVIDER_SITE_OTHER): Payer: PPO | Admitting: Internal Medicine

## 2015-06-11 ENCOUNTER — Encounter: Payer: Self-pay | Admitting: Internal Medicine

## 2015-06-11 VITALS — BP 136/80 | Temp 98.8°F | Ht 66.5 in | Wt 265.3 lb

## 2015-06-11 DIAGNOSIS — R233 Spontaneous ecchymoses: Secondary | ICD-10-CM

## 2015-06-11 DIAGNOSIS — Z79899 Other long term (current) drug therapy: Secondary | ICD-10-CM | POA: Diagnosis not present

## 2015-06-11 DIAGNOSIS — R7303 Prediabetes: Secondary | ICD-10-CM | POA: Diagnosis not present

## 2015-06-11 DIAGNOSIS — Z0000 Encounter for general adult medical examination without abnormal findings: Secondary | ICD-10-CM | POA: Diagnosis not present

## 2015-06-11 DIAGNOSIS — Z7901 Long term (current) use of anticoagulants: Secondary | ICD-10-CM

## 2015-06-11 DIAGNOSIS — R748 Abnormal levels of other serum enzymes: Secondary | ICD-10-CM

## 2015-06-11 DIAGNOSIS — E875 Hyperkalemia: Secondary | ICD-10-CM

## 2015-06-11 DIAGNOSIS — I10 Essential (primary) hypertension: Secondary | ICD-10-CM

## 2015-06-11 LAB — POTASSIUM: Potassium: 4.8 mEq/L (ref 3.5–5.1)

## 2015-06-11 LAB — GAMMA GT: GGT: 53 U/L — ABNORMAL HIGH (ref 7–51)

## 2015-06-11 NOTE — Progress Notes (Signed)
Pre visit review using our clinic review tool, if applicable. No additional management support is needed unless otherwise documented below in the visit note.  Chief Complaint  Patient presents with  . Medicare Wellness    sugars  skini meds     HPI: [name] 69 y.o. comes in today for Preventive Medicare wellness visit .and Chronic disease management Had back surgery dr Trenton Gammon and recovering nicely now left knee  Problematic and will need TKA Noon yearly cv check no new cv sx . Has had pink spots on face and wartly area lef face  Would like to back to derm  .   Health Maintenance  Topic Date Due  . FOOT EXAM  02/08/1956  . URINE MICROALBUMIN  02/08/1956  . OPHTHALMOLOGY EXAM  07/03/2010  . Hepatitis C Screening  06/10/2016 (Originally May 10, 1946)  . HEMOGLOBIN A1C  12/03/2015  . INFLUENZA VACCINE  02/01/2016  . MAMMOGRAM  04/08/2016  . TETANUS/TDAP  06/03/2019  . COLONOSCOPY  08/05/2022  . DEXA SCAN  Completed  . ZOSTAVAX  Completed  . PNA vac Low Risk Adult  Completed   Health Maintenance Review LIFESTYLE:  TAD:   Sugar beverages: Sleep:7 hours sleep apnea rx  Helps     MEDICARE DOCUMENT QUESTIONS  TO SCAN   Hearing: ok   Vision:  No limitations at present . Last eye check UTD  Safety:  Has smoke detector and wears seat belts.  No firearms. No excess sun exposure. Sees dentist regularly.  Falls: no  Advance directive :  Reviewed  Has form never go t signed   Memory: Felt to be good  , no concern from her or her family.  Depression: No anhedonia unusual crying or depressive symptoms  Nutrition: Eats well balanced diet; adequate calcium and vitamin D. No swallowing chewing problems.  Injury: no major injuries in the last six months.  Other healthcare providers:  Reviewed today .  Social:  Lives with spouse married.   Preventive parameters: up-to-date  Reviewed   ADLS:   There are no problems or need for assistance  driving, feeding, obtaining food,  dressing, toileting and bathing, managing money using phone. She is independent.   ROS:  GEN/ HEENT: No fever, significant weight changes sweats headaches vision problems hearing changes, CV/ PULM; No chest pain shortness of breath cough, syncope,edema  change in exercise tolerance. GI /GU: No adominal pain, vomiting, change in bowel habits. No blood in the stool. No significant GU symptoms. SKIN/HEME: ,no acute skin rashes suspicious lesions or bleeding. No lymphadenopathy, nodules, masses.  NEURO/ PSYCH:  No neurologic signs such as weakness numbness. No depression anxiety. IMM/ Allergy: No unusual infections.  Allergy .   REST of 12 system review negative except as per HPI   Past Medical History  Diagnosis Date  . History of CVA (cerebrovascular accident)   . CAD (coronary artery disease) [id number]    post PTCA with bare-metal stenting to mid RCA in December 2004     . Hypertension   . Chronic atrial fibrillation ([place]) 06/2007    Tachybradycardia pacemaker  . Pacemaker   . Anticoagulant long-term use     pradaxa  . Transfusion history   . Hyperlipidemia   . Edema of lower extremity   . History of acute inferior wall MI   . Anxiety   . Diplopia 06/19/2008    Qualifier: Diagnosis of  By: [name] MD, [name]   . Unspecified hemorrhoids without mention of complication 1/69/4503  Colonoscopy--Dr. Carlean Purl   . Dysrhythmia     ATRIAL FIBRILATION  . TIA (transient ischemic attack)   . CHF (congestive heart failure) ([place])   . Shortness of breath   . Myocardial infarction ([place]) [id number]  . Stroke ([place]) [id number]  . Pneumonia 2014    tx. ----  [facility]  . Depression   . Chronic kidney disease     10% function - ?R, other kidney is compensating    . Arthritis     low back & both knees, Spondylolisthesis    . OSA on CPAP     last test- 2010    Family History  Problem Relation Age of Onset  . Suicidality Father     suicide death pt was 3 yrs  . Arrhythmia Mother       heart arrthymia  . Hypertension Mother   . Heart attack Brother     died age 68  . Heart disease Paternal Aunt   . Prostate cancer Maternal Grandfather   . Diabetes Paternal Grandfather     fathers side of the family  . Diabetes Mother     Social History   Social History  . Marital Status: Married    Spouse Name: N/A  . Number of Children: 0  . Years of Education: HS   Occupational History  . retired     previously worked [employer]  . Smoking status: Former Smoker -- 1.00 packs/day for 5 years    Types: Cigarettes    Start date: 05/11/1978    Quit date: 07/03/1982  . Smokeless tobacco: Never Used  . Alcohol Use: Yes     Comment: once q 6 months-socailly-wine  . Drug Use: No  . Sexual Activity: Not Currently   Other Topics Concern  . 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   Married    Originally from [place] of two, high school education   Former smoker [phone] 1ppd   Hunting dogs 7    Retired from [employer] 2     Outpatient Encounter Prescriptions as of 06/11/2015  Medication Sig  . aspirin EC 81 MG tablet Take 81 mg by mouth daily.  [name] atorvastatin (LIPITOR) 40 MG tablet TAKE 1 TABLET BY MOUTH EVERY DAY  . b complex vitamins tablet Take 1 tablet by mouth daily.  . cetirizine (ZYRTEC) 10 MG tablet Take 10 mg by mouth daily.  . Cholecalciferol (VITAMIN D-3) 5000 UNITS TABS Take 5,000 Units by mouth every morning.  . dabigatran (PRADAXA) 150 MG CAPS capsule Take 150 mg by mouth 2 (two) times daily.  . diazepam (VALIUM) 5 MG tablet Take 1-2 tablets (5-10 mg total) by mouth every 6 (six) hours as needed for muscle spasms.  [name] diltiazem (CARDIZEM CD) 180 MG 24 hr capsule TAKE 1 CAPSULE (180 MG TOTAL) BY MOUTH DAILY.  [name] dofetilide (TIKOSYN) 250 MCG capsule TAKE 1 CAPSULE (250 MCG TOTAL) BY MOUTH 2 (TWO) TIMES DAILY.  [name] FLUoxetine  (PROZAC) 20 MG capsule TAKE ONE CAPSULE BY MOUTH EVERY MORNING  . furosemide (LASIX) 40 MG tablet TAKE 1 TABLET BY MOUTH TWICE A DAY  . KLOR-CON M20 20 MEQ tablet TAKE 1 TABLET BY MOUTH TWICE A DAY  . losartan (COZAAR) 100 MG tablet TAKE 1 TABLET BY MOUTH EVERY DAY  . Lysine HCl 1000 MG TABS Take 1  tablet by mouth daily as needed (for fever blisters).   . metoprolol succinate (TOPROL-XL) 100 MG 24 hr tablet Take 1 Tablet by Mouth every morning & 1/2 Tab in the evening with or Immediately following a meal.  . Multiple Vitamin (MULTIVITAMIN) tablet Take 1 tablet by mouth daily.    . Naproxen Sod-Diphenhydramine (ALEVE PM) 220-25 MG TABS Take 2 tablets by mouth at bedtime.  . naproxen sodium (ANAPROX) 220 MG tablet Take 440 mg by mouth every morning.   . vitamin B-12 (CYANOCOBALAMIN) 1000 MCG tablet Take 1,000 mcg by mouth daily.  . vitamin E 1000 UNIT capsule Take 1,000 Units by mouth daily.  [name] HYDROcodone-acetaminophen (NORCO/VICODIN) 5-325 MG tablet Take 1-2 tablets by mouth every 4 (four) hours as needed (mild pain). (Patient not taking: Reported on 06/11/2015)  . nitroGLYCERIN (NITROSTAT) 0.4 MG SL tablet Place 1 tablet (0.4 mg total) under the tongue every 5 (five) minutes as needed. (Patient not taking: Reported on 06/11/2015)   No facility-administered encounter medications on file as of 06/11/2015.    EXAM:  BP 136/80 mmHg  Temp(Src) 98.8 F (37.1 C) (Oral)  Ht 5' 6.5" (1.689 m)  Wt 265 lb 4.8 oz (120.339 kg)  BMI 42.18 kg/m2  Body mass index is 42.18 kg/(m^2).  Physical Exam: Vital signs reviewed LEX:NTZG is a well-developed well-nourished alert cooperative   who appears stated age in no acute distress.  HEENT: normocephalic atraumatic , Eyes: PERRL EOM's full, conjunctiva clear, Nares: paten,t no deformity discharge or tenderness., Ears: no deformity EAC's clear TMs with normal landmarks. Mouth: clear OP, no lesions, edema.  Moist mucous membranes. Dentition in adequate  repair. NECK: supple without masses, thyromegaly or bruits. CHEST/PULM:  Clear to auscultation and percussion breath sounds equal no wheeze , rales or rhonchi. No chest wall deformities or tenderness.Breast: normal by inspection . No dimpling, discharge, masses, tenderness or discharge . CV: PMI is nondisplaced, S1 S2 no gallops, murmurs, rubs. Peripheral pulses are present without delay.No JVD .  ABDOMEN: Bowel sounds normal nontender  No guard or rebound, no hepato splenomegal no CVA tenderness.   Extremtities:  No clubbing cyanosis or edema, no acute joint swelling or redness no focal atrophy Back scar healed  NEURO:  Oriented x3, cranial nerves 3-12 appear to be intact, no obvious focal weakness,gait within normal limits no abnormal reflexes or asymmetrical SKIN:  normal turgor, color, no bruising or petechiae. scattered pink spots right face some scale  Left face verrucous lesion 3 mm  PSYCH: Oriented, good eye contact, no obvious depression anxiety, cognition and judgment appear normal. LN: no cervical axillary inguinal adenopathy No noted deficits in memory, attention, and speech. Diabetic Foot Exam - Simple   Simple Foot Form  Diabetic Foot exam was performed with the following findings:  Yes 06/11/2015 11:49 AM  Visual Inspection  No deformities, no ulcerations, no other skin breakdown bilaterally:  Yes  Sensation Testing  Intact to touch and monofilament testing bilaterally:  Yes  Pulse Check  Posterior Tibialis and Dorsalis pulse intact bilaterally:  Yes  Comments       Lab Results  Component Value Date   WBC 8.6 06/04/2015   HGB 13.8 06/04/2015   HCT 41.9 06/04/2015   PLT 274.0 06/04/2015   GLUCOSE 129* 06/04/2015   CHOL 135 06/04/2015   TRIG 119.0 06/04/2015   HDL 42.00 06/04/2015   LDLCALC 69 06/04/2015   ALT 22 06/04/2015   AST 17 06/04/2015   NA 140 06/04/2015   K 4.8  06/11/2015   CL 102 06/04/2015   CREATININE 1.02 06/04/2015   BUN 25* 06/04/2015   CO2 30  06/04/2015   TSH 2.81 06/04/2015   INR 1.30 10/06/2013   HGBA1C 6.5 06/04/2015   Wt Readings from Last 3 Encounters:  06/11/15 265 lb 4.8 oz (120.339 kg)  06/07/15 260 lb 9.6 oz (118.207 kg)  04/05/15 265 lb (120.203 kg)    ASSESSMENT AND PLAN:  Discussed the following assessment and plan:  Medicare annual wellness visit, subsequent  Pre-diabetes early DM   Medication management  Serum potassium elevated - repeat today - Plan: Potassium  Elevated alkaline phosphatase measurement - improved poss med effect check ggt then plan fu - Plan: Gamma GT  Skin spots, red - poss ak on face?  Essential hypertension  Anticoagulant long-term use  Patient Care Team: [name], MD as PCP - General Evans Lance, MD (Cardiology) Thayer Headings, MD (Cardiology) Kathee Delton, MD as Attending Physician (Pulmonary Disease) Gaynelle Arabian, MD as Consulting Physician (Orthopedic Surgery)  Patient Instructions  bg stable  At this time  Potassium level is slightly up  Need to repeat this level and consider change  Med dose if confirmed. ( as per cards)  ... Other wise   Ov in 6 months   hga1c  Bmp pre visit      Health Maintenance, Female Adopting a healthy lifestyle and getting preventive care can go a long way to promote health and wellness. Talk with your health care provider about what schedule of regular examinations is right for you. This is a good chance for you to check in with your provider about disease prevention and staying healthy. In between checkups, there are plenty of things you can do on your own. Experts have done a lot of research about which lifestyle changes and preventive measures are most likely to keep you healthy. Ask your health care provider for more information. WEIGHT AND DIET  Eat a healthy diet  Be sure to include plenty of vegetables, fruits, low-fat dairy products, and lean protein.  Do not eat a lot of foods high in solid fats, added sugars, or  salt.  Get regular exercise. This is one of the most important things you can do for your health.  Most adults should exercise for at least 150 minutes each week. The exercise should increase your heart rate and make you sweat (moderate-intensity exercise).  Most adults should also do strengthening exercises at least twice a week. This is in addition to the moderate-intensity exercise.  Maintain a healthy weight  Body mass index (BMI) is a measurement that can be used to identify possible weight problems. It estimates body fat based on height and weight. Your health care provider can help determine your BMI and help you achieve or maintain a healthy weight.  For females 61 years of age and older:   A BMI below 18.5 is considered underweight.  A BMI of 18.5 to 24.9 is normal.  A BMI of 25 to 29.9 is considered overweight.  A BMI of 30 and above is considered obese.  Watch levels of cholesterol and blood lipids  You should start having your blood tested for lipids and cholesterol at 69 years of age, then have this test every 5 years.  You may need to have your cholesterol levels checked more often if:  Your lipid or cholesterol levels are high.  You are older than 69 years of age.  You are at high  risk for heart disease.  CANCER SCREENING   Lung Cancer  Lung cancer screening is recommended for adults 87-18 years old who are at high risk for lung cancer because of a history of smoking.  A yearly low-dose CT scan of the lungs is recommended for people who:  Currently smoke.  Have quit within the past 15 years.  Have at least a 30-pack-year history of smoking. A pack year is smoking an average of one pack of cigarettes a day for 1 year.  Yearly screening should continue until it has been 15 years since you quit.  Yearly screening should stop if you develop a health problem that would prevent you from having lung cancer treatment.  Breast Cancer  Practice breast  self-awareness. This means understanding how your breasts normally appear and feel.  It also means doing regular breast self-exams. Let your health care provider know about any changes, no matter how small.  If you are in your 20s or 30s, you should have a clinical breast exam (CBE) by a health care provider every 1-3 years as part of a regular health exam.  If you are 36 or older, have a CBE every year. Also consider having a breast X-ray (mammogram) every year.  If you have a family history of breast cancer, talk to your health care provider about genetic screening.  If you are at high risk for breast cancer, talk to your health care provider about having an MRI and a mammogram every year.  Breast cancer gene (BRCA) assessment is recommended for women who have family members with BRCA-related cancers. BRCA-related cancers include:  Breast.  Ovarian.  Tubal.  Peritoneal cancers.  Results of the assessment will determine the need for genetic counseling and BRCA1 and BRCA2 testing. Cervical Cancer Your health care provider may recommend that you be screened regularly for cancer of the pelvic organs (ovaries, uterus, and vagina). This screening involves a pelvic examination, including checking for microscopic changes to the surface of your cervix (Pap test). You may be encouraged to have this screening done every 3 years, beginning at age 64.  For women ages 41-65, health care providers may recommend pelvic exams and Pap testing every 3 years, or they may recommend the Pap and pelvic exam, combined with testing for human papilloma virus (HPV), every 5 years. Some types of HPV increase your risk of cervical cancer. Testing for HPV may also be done on women of any age with unclear Pap test results.  Other health care providers may not recommend any screening for nonpregnant women who are considered low risk for pelvic cancer and who do not have symptoms. Ask your health care provider if a  screening pelvic exam is right for you.  If you have had past treatment for cervical cancer or a condition that could lead to cancer, you need Pap tests and screening for cancer for at least 20 years after your treatment. If Pap tests have been discontinued, your risk factors (such as having a new sexual partner) need to be reassessed to determine if screening should resume. Some women have medical problems that increase the chance of getting cervical cancer. In these cases, your health care provider may recommend more frequent screening and Pap tests. Colorectal Cancer  This type of cancer can be detected and often prevented.  Routine colorectal cancer screening usually begins at 69 years of age and continues through 69 years of age.  Your health care provider may recommend screening at an earlier age  if you have risk factors for colon cancer.  Your health care provider may also recommend using home test kits to check for hidden blood in the stool.  A small camera at the end of a tube can be used to examine your colon directly (sigmoidoscopy or colonoscopy). This is done to check for the earliest forms of colorectal cancer.  Routine screening usually begins at age 32.  Direct examination of the colon should be repeated every 5-10 years through 69 years of age. However, you may need to be screened more often if early forms of precancerous polyps or small growths are found. Skin Cancer  Check your skin from head to toe regularly.  Tell your health care provider about any new moles or changes in moles, especially if there is a change in a mole's shape or color.  Also tell your health care provider if you have a mole that is larger than the size of a pencil eraser.  Always use sunscreen. Apply sunscreen liberally and repeatedly throughout the day.  Protect yourself by wearing long sleeves, pants, a wide-brimmed hat, and sunglasses whenever you are outside. HEART DISEASE, DIABETES, AND HIGH  BLOOD PRESSURE   High blood pressure causes heart disease and increases the risk of stroke. High blood pressure is more likely to develop in:  People who have blood pressure in the high end of the normal range (130-139/85-89 mm Hg).  People who are overweight or obese.  People who are African American.  If you are 2-9 years of age, have your blood pressure checked every 3-5 years. If you are 46 years of age or older, have your blood pressure checked every year. You should have your blood pressure measured twice--once when you are at a hospital or clinic, and once when you are not at a hospital or clinic. Record the average of the two measurements. To check your blood pressure when you are not at a hospital or clinic, you can use:  An automated blood pressure machine at a pharmacy.  A home blood pressure monitor.  If you are between 60 years and 59 years old, ask your health care provider if you should take aspirin to prevent strokes.  Have regular diabetes screenings. This involves taking a blood sample to check your fasting blood sugar level.  If you are at a normal weight and have a low risk for diabetes, have this test once every three years after 69 years of age.  If you are overweight and have a high risk for diabetes, consider being tested at a younger age or more often. PREVENTING INFECTION  Hepatitis B  If you have a higher risk for hepatitis B, you should be screened for this virus. You are considered at high risk for hepatitis B if:  You were born in a country where hepatitis B is common. Ask your health care provider which countries are considered high risk.  Your parents were born in a high-risk country, and you have not been immunized against hepatitis B (hepatitis B vaccine).  You have HIV or AIDS.  You use needles to inject street drugs.  You live with someone who has hepatitis B.  You have had sex with someone who has hepatitis B.  You get hemodialysis  treatment.  You take certain medicines for conditions, including cancer, organ transplantation, and autoimmune conditions. Hepatitis C  Blood testing is recommended for:  Everyone born from [birth year] through 1965.  Anyone with known risk factors for hepatitis C. Sexually transmitted  infections (STIs)  You should be screened for sexually transmitted infections (STIs) including gonorrhea and chlamydia if:  You are sexually active and are younger than 69 years of age.  You are older than 69 years of age and your health care provider tells you that you are at risk for this type of infection.  Your sexual activity has changed since you were last screened and you are at an increased risk for chlamydia or gonorrhea. Ask your health care provider if you are at risk.  If you do not have HIV, but are at risk, it may be recommended that you take a prescription medicine daily to prevent HIV infection. This is called pre-exposure prophylaxis (PrEP). You are considered at risk if:  You are sexually active and do not regularly use condoms or know the HIV status of your partner(s).  You take drugs by injection.  You are sexually active with a partner who has HIV. Talk with your health care provider about whether you are at high risk of being infected with HIV. If you choose to begin PrEP, you should first be tested for HIV. You should then be tested every 3 months for as long as you are taking PrEP.  PREGNANCY   If you are premenopausal and you may become pregnant, ask your health care provider about preconception counseling.  If you may become pregnant, take 400 to 800 micrograms (mcg) of folic acid every day.  If you want to prevent pregnancy, talk to your health care provider about birth control (contraception). OSTEOPOROSIS AND MENOPAUSE   Osteoporosis is a disease in which the bones lose minerals and strength with aging. This can result in serious bone fractures. Your risk for osteoporosis can  be identified using a bone density scan.  If you are 33 years of age or older, or if you are at risk for osteoporosis and fractures, ask your health care provider if you should be screened.  Ask your health care provider whether you should take a calcium or vitamin D supplement to lower your risk for osteoporosis.  Menopause may have certain physical symptoms and risks.  Hormone replacement therapy may reduce some of these symptoms and risks. Talk to your health care provider about whether hormone replacement therapy is right for you.  HOME CARE INSTRUCTIONS   Schedule regular health, dental, and eye exams.  Stay current with your immunizations.   Do not use any tobacco products including cigarettes, chewing tobacco, or electronic cigarettes.  If you are pregnant, do not drink alcohol.  If you are breastfeeding, limit how much and how often you drink alcohol.  Limit alcohol intake to no more than 1 drink per day for nonpregnant women. One drink equals 12 ounces of beer, 5 ounces of wine, or 1 ounces of hard liquor.  Do not use street drugs.  Do not share needles.  Ask your health care provider for help if you need support or information about quitting drugs.  Tell your health care provider if you often feel depressed.  Tell your health care provider if you have ever been abused or do not feel safe at home.   This information is not intended to replace advice given to you by your health care provider. Make sure you discuss any questions you have with your health care provider.   Document Released: 01/02/2011 Document Revised: 07/10/2014 Document Reviewed: 05/21/2013 Elsevier Interactive Patient Education 2016 [place] K. [name] M.D.

## 2015-06-11 NOTE — Patient Instructions (Addendum)
bg stable  At this time  Potassium level is slightly up  Need to repeat this level and consider change  Med dose if confirmed. ( as per cards)  ... Other wise   Ov in 6 months   hga1c  Bmp pre visit      Health Maintenance, Female Adopting a healthy lifestyle and getting preventive care can go a long way to promote health and wellness. Talk with your health care provider about what schedule of regular examinations is right for you. This is a good chance for you to check in with your provider about disease prevention and staying healthy. In between checkups, there are plenty of things you can do on your own. Experts have done a lot of research about which lifestyle changes and preventive measures are most likely to keep you healthy. Ask your health care provider for more information. WEIGHT AND DIET  Eat a healthy diet  Be sure to include plenty of vegetables, fruits, low-fat dairy products, and lean protein.  Do not eat a lot of foods high in solid fats, added sugars, or salt.  Get regular exercise. This is one of the most important things you can do for your health.  Most adults should exercise for at least 150 minutes each week. The exercise should increase your heart rate and make you sweat (moderate-intensity exercise).  Most adults should also do strengthening exercises at least twice a week. This is in addition to the moderate-intensity exercise.  Maintain a healthy weight  Body mass index (BMI) is a measurement that can be used to identify possible weight problems. It estimates body fat based on height and weight. Your health care provider can help determine your BMI and help you achieve or maintain a healthy weight.  For females 53 years of age and older:   A BMI below 18.5 is considered underweight.  A BMI of 18.5 to 24.9 is normal.  A BMI of 25 to 29.9 is considered overweight.  A BMI of 30 and above is considered obese.  Watch levels of cholesterol and blood  lipids  You should start having your blood tested for lipids and cholesterol at 69 years of age, then have this test every 5 years.  You may need to have your cholesterol levels checked more often if:  Your lipid or cholesterol levels are high.  You are older than 69 years of age.  You are at high risk for heart disease.  CANCER SCREENING   Lung Cancer  Lung cancer screening is recommended for adults 93-6 years old who are at high risk for lung cancer because of a history of smoking.  A yearly low-dose CT scan of the lungs is recommended for people who:  Currently smoke.  Have quit within the past 15 years.  Have at least a 30-pack-year history of smoking. A pack year is smoking an average of one pack of cigarettes a day for 1 year.  Yearly screening should continue until it has been 15 years since you quit.  Yearly screening should stop if you develop a health problem that would prevent you from having lung cancer treatment.  Breast Cancer  Practice breast self-awareness. This means understanding how your breasts normally appear and feel.  It also means doing regular breast self-exams. Let your health care provider know about any changes, no matter how small.  If you are in your 20s or 30s, you should have a clinical breast exam (CBE) by a health care provider every  1-3 years as part of a regular health exam.  If you are 8 or older, have a CBE every year. Also consider having a breast X-ray (mammogram) every year.  If you have a family history of breast cancer, talk to your health care provider about genetic screening.  If you are at high risk for breast cancer, talk to your health care provider about having an MRI and a mammogram every year.  Breast cancer gene (BRCA) assessment is recommended for women who have family members with BRCA-related cancers. BRCA-related cancers include:  Breast.  Ovarian.  Tubal.  Peritoneal cancers.  Results of the assessment  will determine the need for genetic counseling and BRCA1 and BRCA2 testing. Cervical Cancer Your health care provider may recommend that you be screened regularly for cancer of the pelvic organs (ovaries, uterus, and vagina). This screening involves a pelvic examination, including checking for microscopic changes to the surface of your cervix (Pap test). You may be encouraged to have this screening done every 3 years, beginning at age 4.  For women ages 26-65, health care providers may recommend pelvic exams and Pap testing every 3 years, or they may recommend the Pap and pelvic exam, combined with testing for human papilloma virus (HPV), every 5 years. Some types of HPV increase your risk of cervical cancer. Testing for HPV may also be done on women of any age with unclear Pap test results.  Other health care providers may not recommend any screening for nonpregnant women who are considered low risk for pelvic cancer and who do not have symptoms. Ask your health care provider if a screening pelvic exam is right for you.  If you have had past treatment for cervical cancer or a condition that could lead to cancer, you need Pap tests and screening for cancer for at least 20 years after your treatment. If Pap tests have been discontinued, your risk factors (such as having a new sexual partner) need to be reassessed to determine if screening should resume. Some women have medical problems that increase the chance of getting cervical cancer. In these cases, your health care provider may recommend more frequent screening and Pap tests. Colorectal Cancer  This type of cancer can be detected and often prevented.  Routine colorectal cancer screening usually begins at 69 years of age and continues through 69 years of age.  Your health care provider may recommend screening at an earlier age if you have risk factors for colon cancer.  Your health care provider may also recommend using home test kits to check  for hidden blood in the stool.  A small camera at the end of a tube can be used to examine your colon directly (sigmoidoscopy or colonoscopy). This is done to check for the earliest forms of colorectal cancer.  Routine screening usually begins at age 28.  Direct examination of the colon should be repeated every 5-10 years through 69 years of age. However, you may need to be screened more often if early forms of precancerous polyps or small growths are found. Skin Cancer  Check your skin from head to toe regularly.  Tell your health care provider about any new moles or changes in moles, especially if there is a change in a mole's shape or color.  Also tell your health care provider if you have a mole that is larger than the size of a pencil eraser.  Always use sunscreen. Apply sunscreen liberally and repeatedly throughout the day.  Protect yourself by wearing  long sleeves, pants, a wide-brimmed hat, and sunglasses whenever you are outside. HEART DISEASE, DIABETES, AND HIGH BLOOD PRESSURE   High blood pressure causes heart disease and increases the risk of stroke. High blood pressure is more likely to develop in:  People who have blood pressure in the high end of the normal range (130-139/85-89 mm Hg).  People who are overweight or obese.  People who are African American.  If you are 6-46 years of age, have your blood pressure checked every 3-5 years. If you are 36 years of age or older, have your blood pressure checked every year. You should have your blood pressure measured twice--once when you are at a hospital or clinic, and once when you are not at a hospital or clinic. Record the average of the two measurements. To check your blood pressure when you are not at a hospital or clinic, you can use:  An automated blood pressure machine at a pharmacy.  A home blood pressure monitor.  If you are between 82 years and 51 years old, ask your health care provider if you should take  aspirin to prevent strokes.  Have regular diabetes screenings. This involves taking a blood sample to check your fasting blood sugar level.  If you are at a normal weight and have a low risk for diabetes, have this test once every three years after 69 years of age.  If you are overweight and have a high risk for diabetes, consider being tested at a younger age or more often. PREVENTING INFECTION  Hepatitis B  If you have a higher risk for hepatitis B, you should be screened for this virus. You are considered at high risk for hepatitis B if:  You were born in a country where hepatitis B is common. Ask your health care provider which countries are considered high risk.  Your parents were born in a high-risk country, and you have not been immunized against hepatitis B (hepatitis B vaccine).  You have HIV or AIDS.  You use needles to inject street drugs.  You live with someone who has hepatitis B.  You have had sex with someone who has hepatitis B.  You get hemodialysis treatment.  You take certain medicines for conditions, including cancer, organ transplantation, and autoimmune conditions. Hepatitis C  Blood testing is recommended for:  Everyone born from [birth year] through 1965.  Anyone with known risk factors for hepatitis C. Sexually transmitted infections (STIs)  You should be screened for sexually transmitted infections (STIs) including gonorrhea and chlamydia if:  You are sexually active and are younger than 69 years of age.  You are older than 69 years of age and your health care provider tells you that you are at risk for this type of infection.  Your sexual activity has changed since you were last screened and you are at an increased risk for chlamydia or gonorrhea. Ask your health care provider if you are at risk.  If you do not have HIV, but are at risk, it may be recommended that you take a prescription medicine daily to prevent HIV infection. This is called  pre-exposure prophylaxis (PrEP). You are considered at risk if:  You are sexually active and do not regularly use condoms or know the HIV status of your partner(s).  You take drugs by injection.  You are sexually active with a partner who has HIV. Talk with your health care provider about whether you are at high risk of being infected with HIV. If you  choose to begin PrEP, you should first be tested for HIV. You should then be tested every 3 months for as long as you are taking PrEP.  PREGNANCY   If you are premenopausal and you may become pregnant, ask your health care provider about preconception counseling.  If you may become pregnant, take 400 to 800 micrograms (mcg) of folic acid every day.  If you want to prevent pregnancy, talk to your health care provider about birth control (contraception). OSTEOPOROSIS AND MENOPAUSE   Osteoporosis is a disease in which the bones lose minerals and strength with aging. This can result in serious bone fractures. Your risk for osteoporosis can be identified using a bone density scan.  If you are 56 years of age or older, or if you are at risk for osteoporosis and fractures, ask your health care provider if you should be screened.  Ask your health care provider whether you should take a calcium or vitamin D supplement to lower your risk for osteoporosis.  Menopause may have certain physical symptoms and risks.  Hormone replacement therapy may reduce some of these symptoms and risks. Talk to your health care provider about whether hormone replacement therapy is right for you.  HOME CARE INSTRUCTIONS   Schedule regular health, dental, and eye exams.  Stay current with your immunizations.   Do not use any tobacco products including cigarettes, chewing tobacco, or electronic cigarettes.  If you are pregnant, do not drink alcohol.  If you are breastfeeding, limit how much and how often you drink alcohol.  Limit alcohol intake to no more than 1  drink per day for nonpregnant women. One drink equals 12 ounces of beer, 5 ounces of wine, or 1 ounces of hard liquor.  Do not use street drugs.  Do not share needles.  Ask your health care provider for help if you need support or information about quitting drugs.  Tell your health care provider if you often feel depressed.  Tell your health care provider if you have ever been abused or do not feel safe at home.   This information is not intended to replace advice given to you by your health care provider. Make sure you discuss any questions you have with your health care provider.   Document Released: 01/02/2011 Document Revised: 07/10/2014 Document Reviewed: 05/21/2013 Elsevier Interactive Patient Education [employer].

## 2015-06-28 ENCOUNTER — Other Ambulatory Visit: Payer: Self-pay | Admitting: Internal Medicine

## 2015-06-30 NOTE — Telephone Encounter (Signed)
Sent to the pharmacy by e-scribe. 

## 2015-07-08 ENCOUNTER — Ambulatory Visit: Payer: PPO | Admitting: Pulmonary Disease

## 2015-07-08 DIAGNOSIS — M4316 Spondylolisthesis, lumbar region: Secondary | ICD-10-CM | POA: Diagnosis not present

## 2015-07-19 ENCOUNTER — Other Ambulatory Visit: Payer: Self-pay | Admitting: *Deleted

## 2015-07-19 DIAGNOSIS — G4733 Obstructive sleep apnea (adult) (pediatric): Secondary | ICD-10-CM | POA: Diagnosis not present

## 2015-07-19 MED ORDER — METOPROLOL SUCCINATE ER 100 MG PO TB24
ORAL_TABLET | ORAL | Status: DC
Start: 2015-07-19 — End: 2016-04-04

## 2015-07-27 DIAGNOSIS — M1712 Unilateral primary osteoarthritis, left knee: Secondary | ICD-10-CM | POA: Diagnosis not present

## 2015-08-19 ENCOUNTER — Telehealth: Payer: Self-pay | Admitting: Internal Medicine

## 2015-08-19 DIAGNOSIS — G4733 Obstructive sleep apnea (adult) (pediatric): Secondary | ICD-10-CM | POA: Diagnosis not present

## 2015-08-19 MED ORDER — VALACYCLOVIR HCL 1 G PO TABS: ORAL_TABLET | ORAL | Status: DC

## 2015-08-19 NOTE — Telephone Encounter (Signed)
Sent to the pharmacy by e-scribe. 

## 2015-08-19 NOTE — Telephone Encounter (Signed)
Pt request refill   valACYclovir (VALTREX) tablet 1,000 mg 30 tabs/ 2 x a day  Pt states she has broken out with a huge fever blister and is traveling out of town in the morning.  Needs refill asap [facility]

## 2015-09-01 ENCOUNTER — Encounter: Payer: Self-pay | Admitting: *Deleted

## 2015-09-16 DIAGNOSIS — G4733 Obstructive sleep apnea (adult) (pediatric): Secondary | ICD-10-CM | POA: Diagnosis not present

## 2015-09-24 ENCOUNTER — Other Ambulatory Visit: Payer: Self-pay | Admitting: Cardiovascular Disease

## 2015-10-04 ENCOUNTER — Encounter: Payer: Self-pay | Admitting: Internal Medicine

## 2015-10-04 ENCOUNTER — Other Ambulatory Visit: Payer: Self-pay | Admitting: Internal Medicine

## 2015-10-04 DIAGNOSIS — I495 Sick sinus syndrome: Secondary | ICD-10-CM

## 2015-10-06 ENCOUNTER — Ambulatory Visit (INDEPENDENT_AMBULATORY_CARE_PROVIDER_SITE_OTHER): Payer: PPO | Admitting: Cardiovascular Disease

## 2015-10-06 ENCOUNTER — Encounter: Payer: Self-pay | Admitting: Cardiovascular Disease

## 2015-10-06 VITALS — BP 126/76 | HR 70 | Ht 66.5 in | Wt 267.2 lb

## 2015-10-06 DIAGNOSIS — Z79899 Other long term (current) drug therapy: Secondary | ICD-10-CM

## 2015-10-06 DIAGNOSIS — I5032 Chronic diastolic (congestive) heart failure: Secondary | ICD-10-CM

## 2015-10-06 DIAGNOSIS — I480 Paroxysmal atrial fibrillation: Secondary | ICD-10-CM | POA: Diagnosis not present

## 2015-10-06 DIAGNOSIS — I10 Essential (primary) hypertension: Secondary | ICD-10-CM | POA: Diagnosis not present

## 2015-10-06 LAB — BASIC METABOLIC PANEL
BUN: 25 mg/dL (ref 7–25)
CO2: 30 mmol/L (ref 20–31)
Calcium: 9.1 mg/dL (ref 8.6–10.4)
Chloride: 101 mmol/L (ref 98–110)
Creat: 0.96 mg/dL (ref 0.50–0.99)
Glucose, Bld: 115 mg/dL — ABNORMAL HIGH (ref 65–99)
Potassium: 4.8 mmol/L (ref 3.5–5.3)
Sodium: 140 mmol/L (ref 135–146)

## 2015-10-06 LAB — MAGNESIUM: Magnesium: 2.2 mg/dL (ref 1.5–2.5)

## 2015-10-06 NOTE — Patient Instructions (Signed)
Medication Instructions:  Your physician recommends that you continue on your current medications as directed. Please refer to the Current Medication list given to you today.   Labwork: TODAY - magnesium level, basic metabolic panel   Testing/Procedures: None Ordered   Follow-Up: Your physician wants you to follow-up in: 6 months with Dr. Acie Fredrickson.  You will receive a reminder letter in the mail two months in advance. If you don't receive a letter, please call our office to schedule the follow-up appointment.   If you need a refill on your cardiac medications before your next appointment, please call your pharmacy.   Thank you for choosing [facility]! Christen Bame, RN [phone]

## 2015-10-06 NOTE — Telephone Encounter (Signed)
Sent to the pharmacy by e-scribe. 

## 2015-10-06 NOTE — Progress Notes (Signed)
[name] Date of Birth  09/11/1945 [facility] [address]    [place], [place]  [address] [phone]  Fax  [phone]   Problem list: 1. Atrial fibrillation 2. Status post pacemaker implantation 3. Coronary artery disease-status post PTCA and stenting of her right coronary artery 4. Hyperlipidemia 5. Hypertension 6. Obstructive sleep apnea-currently on CPAP  History of Present Illness:  [name] is a 70 year old female with a history of atrial fibrillation. She status post pacemaker implantation. She also has a history of coronary artery disease and is status post PTCA and stenting of the right coronary artery. She also has a history of hyperlipidemia, hypertension, and sleep apnea.  She uses a CPAP mask at night.  The CPAP mask has really helped her stay in normal sinus rhythm.  She complains of having some episodes of chest tightness. This occurs at times. One episode was relieved with sublingual nitroglycerin. She states that it is not nearly as severe as her previous episodes of angina.  January 13, 21014: She was hospitalized in November with severe shortness breath and "double pneumonia". She is slowly recovering.  She's looking forward to  getting back into her exercise program.  She has not been exercising at all.  She's had some leg edema.  She denies any chest pain or shortness breath.  She's has mild leg swelling for the past several months. She does not add salt any of her food. She tries to stay away from salty foods and processed meat.  September 18, 2013:  She remains short of breath  - especialy with exertion  Echo in Jan. 2014:   - Left ventricle: The cavity size was normal. Wall thickness was increased in a pattern of mild LVH. Systolic function was normal. The estimated ejection fraction was in the range of 60% to 65%. - Pulmonary arteries: PA peak pressure: 92mm Hg   October 03, 2013:  Level was seen about 2 weeks ago with some increasing  shortness of breath. She was noted to have moderate pulmonary hypertension on echo. She started on diltiazem. She has fairly well preserved left ventricle systolic function.  We started her on Cardizem XL 180 mg a day. She has not done well since that medication in addition. She's more short of breath. She's gained about 5 pounds.  She presents today for her echocardiogram. She is very short of breath and was worked into the schedule. Her O2 saturations at rest were 86%. We placed  oxygen on her and her O2 saturations increased above 90%. She is clearly more short of breath after the addition of the diltiazem.   She has not been eating any extra salt .  Has been taking her lasix.   She denies any chest pain - specifically was asked about pleuretic CP.  Had orthopnea until we placed the oxygen on her.   10/20/2013: [name] was admitted to the hospital with severe shortness breath when I saw her several weeks ago. She was found to be volume overload. She was aggressively diuresed and lost about 10 pounds of weight. Cardiac catheterization following that diuresis revealed only minimal coronary artery irregularities. Her left ventricular systolic function was normal. Her PA pressures were moderately elevated with an estimated PA pressure of around 57.   She was discharged on the same dose of Lasix that she was on at admission.   She is avoiding salt.    Dec. 3, 2015:  [name] is a 71 yo followed for chronic  diastolic CHf and pulmonary hypertension ( est. PA pressure of 70)  [name] has been back in the hospital since I last saw her.  She has  Increased her lasix which has helped.  She does a pretty good job of avoiding salt.   Sept. 27, 2016:  Is doing well.  Has some dyspnea - she thinks likely due to weight gain . No cardiac symptoms .  Needs to have back surgery .   October 06, 2015:  Overall doing well. Had her back surgery in Oct.  Wt. 267 lbs today      Current Outpatient Prescriptions  on File Prior to Visit  Medication Sig Dispense Refill  . aspirin EC 81 MG tablet Take 81 mg by mouth daily.    [name] atorvastatin (LIPITOR) 40 MG tablet TAKE 1 TABLET BY MOUTH EVERY DAY 30 tablet 10  . b complex vitamins tablet Take 1 tablet by mouth daily.    . cetirizine (ZYRTEC) 10 MG tablet Take 10 mg by mouth daily.    . Cholecalciferol (VITAMIN D-3) 5000 UNITS TABS Take 5,000 Units by mouth every morning.    . dabigatran (PRADAXA) 150 MG CAPS capsule Take 150 mg by mouth 2 (two) times daily.    . diazepam (VALIUM) 5 MG tablet Take 1-2 tablets (5-10 mg total) by mouth every 6 (six) hours as needed for muscle spasms. 60 tablet 0  . diltiazem (CARDIZEM CD) 180 MG 24 hr capsule TAKE 1 CAPSULE (180 MG TOTAL) BY MOUTH DAILY. 90 capsule 1  . dofetilide (TIKOSYN) 250 MCG capsule TAKE 1 CAPSULE (250 MCG TOTAL) BY MOUTH 2 (TWO) TIMES DAILY. 180 capsule 2  . FLUoxetine (PROZAC) 20 MG capsule TAKE ONE CAPSULE BY MOUTH EVERY MORNING 90 capsule 0  . furosemide (LASIX) 40 MG tablet TAKE 1 TABLET BY MOUTH TWICE A DAY 180 tablet 3  . KLOR-CON M20 20 MEQ tablet TAKE 1 TABLET BY MOUTH TWICE A DAY 180 tablet 3  . losartan (COZAAR) 100 MG tablet TAKE 1 TABLET BY MOUTH EVERY DAY 90 tablet 3  . Lysine HCl 1000 MG TABS Take 1 tablet by mouth daily as needed (for fever blisters).     . metoprolol succinate (TOPROL-XL) 100 MG 24 hr tablet Take 1 Tablet by Mouth every morning & 1/2 Tab in the evening with or Immediately following a meal. 135 tablet 2  . Multiple Vitamin (MULTIVITAMIN) tablet Take 1 tablet by mouth daily.      . Naproxen Sod-Diphenhydramine (ALEVE PM) 220-25 MG TABS Take 2 tablets by mouth at bedtime.    . naproxen sodium (ANAPROX) 220 MG tablet Take 440 mg by mouth every morning.     . nitroGLYCERIN (NITROSTAT) 0.4 MG SL tablet Place 1 tablet (0.4 mg total) under the tongue every 5 (five) minutes as needed. 25 tablet 5  . valACYclovir (VALTREX) 1000 MG tablet Take 2 by mouth and repeat in 12 hours for  cold sores 30 tablet 2  . vitamin B-12 (CYANOCOBALAMIN) 1000 MCG tablet Take 5,000 mcg by mouth daily.     . vitamin E 1000 UNIT capsule Take 1,000 Units by mouth daily.    [name] HYDROcodone-acetaminophen (NORCO/VICODIN) 5-325 MG tablet Take 1-2 tablets by mouth every 4 (four) hours as needed (mild pain). (Patient not taking: Reported on 06/11/2015) 90 tablet 0   No current facility-administered medications on file prior to visit.    No Known Allergies  Past Medical History  Diagnosis Date  . History of CVA (cerebrovascular accident)   .  CAD (coronary artery disease) [id number]    post PTCA with bare-metal stenting to mid RCA in December 2004     . Hypertension   . Chronic atrial fibrillation ([place]) 06/2007    Tachybradycardia pacemaker  . Pacemaker   . Anticoagulant long-term use     pradaxa  . Transfusion history   . Hyperlipidemia   . Edema of lower extremity   . History of acute inferior wall MI   . Anxiety   . Diplopia 06/19/2008    Qualifier: Diagnosis of  By: [name] MD, [name]   . Unspecified hemorrhoids without mention of complication [id number]    Colonoscopy--Dr. Carlean Purl   . Dysrhythmia     ATRIAL FIBRILATION  . TIA (transient ischemic attack)   . CHF (congestive heart failure) ([place])   . Shortness of breath   . Myocardial infarction ([place]) [id number]  . Stroke ([place]) [id number]  . Pneumonia 2014    tx. ----  [facility]  . Depression   . Chronic kidney disease     10% function - ?R, other kidney is compensating    . Arthritis     low back & both knees, Spondylolisthesis    . OSA on CPAP     last test- 2010    Past Surgical History  Procedure Laterality Date  . Permanent pacemaker  06/2007  . Back surgeries      x2-3 by Dr Trenton Gammon  . Coronary stent placement      C stent  . Coronary angioplasty with stent placement  1998  . Doppler echocardiography  2009  . Back surgery    . Left and right heart catheterization with coronary angiogram N/A 10/06/2013     Procedure: LEFT AND RIGHT HEART CATHETERIZATION WITH CORONARY ANGIOGRAM;  Surgeon: Troy Sine, MD;  Location: [facility] CATH LAB;  Service: Cardiovascular;  Laterality: N/A;  . Total abdominal hysterectomy  1984    2 surgeries endometriosis bso  . Cholecystectomy  1994  . Appendectomy      History  Smoking status  . Former Smoker -- 1.00 packs/day for 5 years  . Types: Cigarettes  . Start date: 05/11/1978  . Quit date: 07/03/1982  Smokeless tobacco  . Never Used    History  Alcohol Use  . Yes    Comment: once q 6 months-socailly-wine    Family History  Problem Relation Age of Onset  . Suicidality Father     suicide death pt was 3 yrs  . Arrhythmia Mother     heart arrthymia  . Hypertension Mother   . Heart attack Brother     died age 6  . Heart disease Paternal Aunt   . Prostate cancer Maternal Grandfather   . Diabetes Paternal Grandfather     fathers side of the family  . Diabetes Mother     Reviw of Systems:  Reviewed in the HPI.  All other systems are negative.  Physical Exam: BP 126/76 mmHg  Pulse 70  Ht 5' 6.5" (1.689 m)  Wt 267 lb 3.2 oz (121.201 kg)  BMI 42.49 kg/m2 The patient is alert and oriented x 3.     HEENT:   She has normal carotids.  no JVD,   Her mucous membranes are moist.  Lungs: Lung exam is clear.   Heart: Regular rate S1-S2.  No S3 heard  Abdomen: Her abdomen is soft. She has good bowel sounds.  Extremities:  She has trace - 1+  ankle edema.  Neuro:  Her  gait is normal. Her neuro exam is nonfocal.    ECG: October 06, 2015:   Sinus brady at 96.   NS St abn.    QTC = .455   Assessment / Plan:  1. Atrial fibrillation -  Maintaining NSR with Tikosyn.  On Pradaxa.  On Dilt .  Will get Mag and BMP  QTC is ok - 455 ms.    2. Status post pacemaker implantation 3. Coronary artery disease-status post PTCA and stenting of her right coronary artery - no angina   4. Hyperlipidemia -   Will check lipids at her next office visit  5.  Hypertension -  BP is well controlled  6. Obstructive sleep apnea-currently on CPAP 7. Spinal disc disease.         Sheldon Sem, Wonda Cheng, MD  10/06/2015 3:52 PM    [facility] [place],  [place], [place]  [address] Pager [phone] Phone: [phone]; Fax: [phone]   [facility]  [address] [place], [place]  [address] [phone]   Fax [phone]

## 2015-10-13 NOTE — Addendum Note (Signed)
Addended by: [name] on: 10/13/2015 11:26 AM   Modules accepted: Orders

## 2015-10-17 DIAGNOSIS — G4733 Obstructive sleep apnea (adult) (pediatric): Secondary | ICD-10-CM | POA: Diagnosis not present

## 2015-11-05 ENCOUNTER — Other Ambulatory Visit: Payer: Self-pay | Admitting: Internal Medicine

## 2015-11-08 NOTE — Telephone Encounter (Signed)
Sent to the pharmacy by e-scribe. 

## 2015-11-16 DIAGNOSIS — G4733 Obstructive sleep apnea (adult) (pediatric): Secondary | ICD-10-CM | POA: Diagnosis not present

## 2015-11-29 ENCOUNTER — Other Ambulatory Visit: Payer: Self-pay | Admitting: Internal Medicine

## 2015-11-30 ENCOUNTER — Encounter: Payer: Self-pay | Admitting: *Deleted

## 2015-12-03 ENCOUNTER — Encounter: Payer: PPO | Admitting: Internal Medicine

## 2015-12-06 ENCOUNTER — Ambulatory Visit (INDEPENDENT_AMBULATORY_CARE_PROVIDER_SITE_OTHER): Payer: PPO | Admitting: Internal Medicine

## 2015-12-06 ENCOUNTER — Encounter (INDEPENDENT_AMBULATORY_CARE_PROVIDER_SITE_OTHER): Payer: Self-pay

## 2015-12-06 ENCOUNTER — Encounter: Payer: Self-pay | Admitting: Internal Medicine

## 2015-12-06 VITALS — BP 126/78 | HR 70 | Ht 65.5 in | Wt 272.6 lb

## 2015-12-06 DIAGNOSIS — G4733 Obstructive sleep apnea (adult) (pediatric): Secondary | ICD-10-CM | POA: Diagnosis not present

## 2015-12-06 DIAGNOSIS — I509 Heart failure, unspecified: Secondary | ICD-10-CM

## 2015-12-06 NOTE — Progress Notes (Signed)
Subjective:     Patient ID: [name], female   DOB: [birth year],    MRN: [id number]  HPI  70 yowf quit smoking in 1984 followed previously by [facility] for osa on CPAP since 2010  06/07/2015 1st  office visit/ [facility]  Re osa Chief Complaint  Patient presents with  . Follow-up    Former [place] pt. Pt states that she needs a new CPAP and supplies. Pt states that she uses it every night and sleeps well.   sleeps well on cpap x 6-8 h / night on 13 cm Wakes up feeling refresh, rarely having palpitations, no am ha or grogginess Not limited by breathing from desired activities  But by back and knee pain  No obvious day to day or daytime variability or assoc chronic cough or cp or chest tightness, subjective wheeze or overt sinus or hb symptoms. No unusual exp hx or h/o childhood pna/ asthma or knowledge of premature birth. Sleeping ok without nocturnal  or early am exacerbation  of respiratory  c/o's or need for noct saba. Also denies any obvious fluctuation of symptoms with weather or environmental changes or other aggravating or alleviating factors except as outlined above   12/06/2015-70 year old female former smoker followed for OSA, complicated by morbid obesity, pulmonary hypertension, CAD/stents/ CHF PAfib/ pacemaker, history CVA, NPSG 2010- AHI 21/ hr New machine 2016 CPAP 13/ [facility] Former [place] pt-seen last by MW 06-2015; DME  [facility] Pt wears CPAP every night and needs new mask.  ROS-see HPI   Negative unless "+" Constitutional:    weight loss, night sweats, fevers, chills, fatigue, lassitude. HEENT:    headaches, difficulty swallowing, tooth/dental problems, sore throat,       sneezing, itching, ear ache, nasal congestion, post nasal drip, snoring CV:    chest pain, orthopnea, PND, swelling in lower extremities, anasarca,                                                    dizziness, palpitations Resp:   shortness of breath with exertion or at rest.                productive cough,    non-productive cough, coughing up of blood.              change in color of mucus.  wheezing.   Skin:    rash or lesions. GI:  No-   heartburn, indigestion, abdominal pain, nausea, vomiting, diarrhea,                 change in bowel habits, loss of appetite GU: dysuria, change in color of urine, no urgency or frequency.   flank pain. MS:   joint pain, stiffness, decreased range of motion, back pain. Neuro-     nothing unusual Psych:  change in mood or affect.  depression or anxiety.   memory loss.  OBJ- Physical Exam General- Alert, Oriented, Affect-appropriate, Distress- none acute, + obese Skin- rash-none, lesions- none, excoriation- none Lymphadenopathy- none Head- atraumatic            Eyes- Gross vision intact, PERRLA, conjunctivae and secretions clear            Ears- Hearing, canals-normal            Nose- Clear, no-Septal dev, mucus, polyps, erosion, perforation  Throat- Mallampati II , mucosa clear , drainage- none, tonsils- atrophic Neck- flexible , trachea midline, no stridor , thyroid nl, carotid no bruit Chest - symmetrical excursion , unlabored           Heart/CV- RRR , no murmur , no gallop  , no rub, nl s1 s2                           - JVD- none , edema- none, stasis changes- none, varices- none           Lung- clear to P&A, wheeze- none, cough- none , dullness-none, rub- none           Chest wall-+  pacemaker left Abd-  Br/ Gen/ Rectal- Not done, not indicated Extrem- cyanosis- none, clubbing, none, atrophy- none, strength- nl Neuro- grossly intact to observation    Assessment:

## 2015-12-06 NOTE — Patient Instructions (Signed)
You can check your CPAP booklet or ask [facility] how to adjust your CPAP humidifier to be more comfortable  Order- DME [facility]- continue CPAP 13, mask of choice, humidifier, supplies, AirView for dx OSA                         [name] needs replacement mask and supplies please

## 2015-12-07 ENCOUNTER — Ambulatory Visit: Payer: PPO | Admitting: Internal Medicine

## 2015-12-08 NOTE — Assessment & Plan Note (Signed)
Pacemaker. He is not in obvious heart failure at this visit. Managed by cardiology.

## 2015-12-08 NOTE — Assessment & Plan Note (Addendum)
We discussed comfort measures. I think it will help if she learns to adjust the humidifier Discussed compliance goals

## 2015-12-17 ENCOUNTER — Other Ambulatory Visit: Payer: Self-pay | Admitting: Family Medicine

## 2015-12-17 DIAGNOSIS — G4733 Obstructive sleep apnea (adult) (pediatric): Secondary | ICD-10-CM | POA: Diagnosis not present

## 2015-12-17 DIAGNOSIS — R7303 Prediabetes: Secondary | ICD-10-CM

## 2015-12-17 DIAGNOSIS — I10 Essential (primary) hypertension: Secondary | ICD-10-CM

## 2015-12-20 ENCOUNTER — Other Ambulatory Visit (INDEPENDENT_AMBULATORY_CARE_PROVIDER_SITE_OTHER): Payer: PPO

## 2015-12-20 DIAGNOSIS — I10 Essential (primary) hypertension: Secondary | ICD-10-CM

## 2015-12-20 DIAGNOSIS — R7303 Prediabetes: Secondary | ICD-10-CM | POA: Diagnosis not present

## 2015-12-20 LAB — BASIC METABOLIC PANEL
BUN: 20 mg/dL (ref 6–23)
CO2: 29 mEq/L (ref 19–32)
Calcium: 9.4 mg/dL (ref 8.4–10.5)
Chloride: 99 mEq/L (ref 96–112)
Creatinine, Ser: 0.92 mg/dL (ref 0.40–1.20)
GFR: 64.17 mL/min (ref >60.00)
Glucose, Bld: 129 mg/dL — ABNORMAL HIGH (ref 70–99)
Potassium: 4.3 mEq/L (ref 3.5–5.1)
Sodium: 137 mEq/L (ref 135–145)

## 2015-12-20 LAB — HEMOGLOBIN A1C: Hgb A1c MFr Bld: 6.7 % — ABNORMAL HIGH (ref 4.6–6.5)

## 2015-12-22 NOTE — Progress Notes (Signed)
Pre visit review using our clinic review tool, if applicable. No additional management support is needed unless otherwise documented below in the visit note.  Chief Complaint  Patient presents with  . Follow-up    HPI: [name] 70 y.o.  Chronic disease management 1-2 x per week  Pie cake  To go camp this next weeks .  Eat out a lot . Has just started to cut her meals and half likes to go to all of garden. Still has a hard time losing weight.   ROS: See pertinent positives and negatives per HPI. No falling new numbness bleeding. Has follow-up with cardiology.  Has a number of spots 1 over her right eyebrow that scaly and red left [place] which she calls a wart but keeps picking at it interval following bleed a couple of other spots on her body.  Past Medical History  Diagnosis Date  . History of CVA (cerebrovascular accident)   . CAD (coronary artery disease) [id number]    post PTCA with bare-metal stenting to mid RCA in December 2004     . Hypertension   . Chronic atrial fibrillation ([place]) 06/2007    Tachybradycardia pacemaker  . Pacemaker   . Anticoagulant long-term use     pradaxa  . Transfusion history   . Hyperlipidemia   . Edema of lower extremity   . History of acute inferior wall MI   . Anxiety   . Diplopia 06/19/2008    Qualifier: Diagnosis of  By: [name] MD, [name]   . Unspecified hemorrhoids without mention of complication [id number]    Colonoscopy--Dr. Carlean Purl   . Dysrhythmia     ATRIAL FIBRILATION  . TIA (transient ischemic attack)   . CHF (congestive heart failure) ([place])   . Shortness of breath   . Myocardial infarction ([place]) [id number]  . Stroke ([place]) [id number]  . Pneumonia 2014    tx. ----  [facility]  . Depression   . Chronic kidney disease     10% function - ?R, other kidney is compensating    . Arthritis     low back & both knees, Spondylolisthesis    . OSA on CPAP     last test- 2010    Family History  Problem Relation Age of Onset    . Suicidality Father     suicide death pt was 3 yrs  . Arrhythmia Mother   . Hypertension Mother   . Heart attack Brother 40  . Heart disease Paternal Aunt   . Prostate cancer Maternal Grandfather   . Diabetes Paternal Grandfather     fathers side of the family  . Diabetes Mother     Social History   Social History  . Marital Status: Married    Spouse Name: N/A  . Number of Children: 0  . Years of Education: HS   Occupational History  . retired     previously worked [employer]  . Smoking status: Former Smoker -- 1.00 packs/day for 5 years    Types: Cigarettes    Start date: 05/11/1978    Quit date: 07/03/1982  . Smokeless tobacco: Never Used  . Alcohol Use: Yes     Comment: once q 6 months-socailly-wine  . Drug Use: No  . Sexual Activity: Not Currently   Other Topics Concern  . 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   Married    Originally from  [place] of two, high school education   Former smoker Pension scheme manager dogs 7    Retired from [employer] 2     Outpatient Prescriptions Prior to Visit  Medication Sig Dispense Refill  . aspirin EC 81 MG tablet Take 81 mg by mouth daily.    [name] atorvastatin (LIPITOR) 40 MG tablet TAKE 1 TABLET BY MOUTH EVERY DAY 30 tablet 10  . b complex vitamins tablet Take 1 tablet by mouth daily.    . cetirizine (ZYRTEC) 10 MG tablet Take 10 mg by mouth daily.    . Cholecalciferol (VITAMIN D-3) 5000 UNITS TABS Take 5,000 Units by mouth every morning.    . dabigatran (PRADAXA) 150 MG CAPS capsule Take 150 mg by mouth 2 (two) times daily.    . diazepam (VALIUM) 5 MG tablet Take 1-2 tablets (5-10 mg total) by mouth every 6 (six) hours as needed for muscle spasms. 60 tablet 0  . diltiazem (CARDIZEM CD) 180 MG 24 hr capsule TAKE 1 CAPSULE (180 MG TOTAL) BY MOUTH DAILY. 90 capsule 1  . dofetilide (TIKOSYN)  250 MCG capsule TAKE 1 CAPSULE (250 MCG TOTAL) BY MOUTH 2 (TWO) TIMES DAILY. 180 capsule 2  . FLUoxetine (PROZAC) 20 MG capsule TAKE ONE CAPSULE BY MOUTH EVERY MORNING 90 capsule 0  . furosemide (LASIX) 40 MG tablet TAKE 1 TABLET BY MOUTH TWICE A DAY 180 tablet 3  . KLOR-CON M20 20 MEQ tablet TAKE 1 TABLET BY MOUTH TWICE A DAY 180 tablet 3  . losartan (COZAAR) 100 MG tablet TAKE 1 TABLET BY MOUTH EVERY DAY 90 tablet 3  . Lysine HCl 1000 MG TABS Take 1 tablet by mouth daily as needed (for fever blisters).     . metoprolol succinate (TOPROL-XL) 100 MG 24 hr tablet Take 1 Tablet by Mouth every morning & 1/2 Tab in the evening with or Immediately following a meal. 135 tablet 2  . Multiple Vitamin (MULTIVITAMIN) tablet Take 1 tablet by mouth daily.      . Naproxen Sod-Diphenhydramine (ALEVE PM) 220-25 MG TABS Take 2 tablets by mouth at bedtime.    . naproxen sodium (ANAPROX) 220 MG tablet Take 440 mg by mouth every morning.     . nitroGLYCERIN (NITROSTAT) 0.4 MG SL tablet Place 1 tablet (0.4 mg total) under the tongue every 5 (five) minutes as needed. 25 tablet 5  . valACYclovir (VALTREX) 1000 MG tablet Take 2 by mouth and repeat in 12 hours for cold sores 30 tablet 2  . vitamin B-12 (CYANOCOBALAMIN) 1000 MCG tablet Take 5,000 mcg by mouth daily.     . vitamin E 1000 UNIT capsule Take 1,000 Units by mouth daily.     No facility-administered medications prior to visit.     EXAM:  BP 138/80 mmHg  Temp(Src) 98.6 F (37 C) (Oral)  Wt 272 lb 4.8 oz (123.514 kg)  Body mass index is 44.61 kg/(m^2).  GENERAL: vitals reviewed and listed above, alert, oriented, appears well hydrated and in no acute distress HEENT: atraumatic, conjunctiva  clear, no obvious abnormalities on inspection of external nose and ear NECK: no obvious masses on inspection palpation   CV: HRRR, no clubbing cyanosis 1+  peripheral edema nl cap refill  MS: moves all extremities without noticeable focal  Abnormality Skin there  breath 3 mm tan patch on the left [place] question verruca's over the right eyebrow 3 mm irregular flat red  scaly. Dark mole with irregular colors left arm warty-like lesion right thigh.  PSYCH: pleasant and cooperative, no obvious depression or anxiety Lab Results  Component Value Date   WBC 8.6 06/04/2015   HGB 13.8 06/04/2015   HCT 41.9 06/04/2015   PLT 274.0 06/04/2015   GLUCOSE 129* 12/20/2015   CHOL 135 06/04/2015   TRIG 119.0 06/04/2015   HDL 42.00 06/04/2015   LDLCALC 69 06/04/2015   ALT 22 06/04/2015   AST 17 06/04/2015   NA 137 12/20/2015   K 4.3 12/20/2015   CL 99 12/20/2015   CREATININE 0.92 12/20/2015   BUN 20 12/20/2015   CO2 29 12/20/2015   TSH 2.81 06/04/2015   INR 1.30 10/06/2013   HGBA1C 6.7* 12/20/2015   Wt Readings from Last 3 Encounters:  12/23/15 272 lb 4.8 oz (123.514 kg)  12/06/15 272 lb 9.6 oz (123.651 kg)  10/06/15 267 lb 3.2 oz (121.201 kg)     ASSESSMENT AND PLAN:  Discussed the following assessment and plan:  Pre-diabetes early DM   Morbid obesity, unspecified obesity type (HCC)  Skin spots, red  Anticoagulant long-term use  Skin area on face could be a precancer AK versus early discussed using not liquid nitrogen on the face versus topicals probably should just go back to the dermatologist skin surgery Center as we discussed. But have them view all of these lesions.  We discussed diabetes medications etc. plan to get hepatitis C screening at next blood test  if not done will follow up on liver tests also -Patient advised to return or notify health care team  if symptoms worsen ,persist or new concerns arise. Total visit 60mins > 50% spent counseling and coordinating care as indicated in above note and in instructions to patient .  Patient Instructions  Intensify lifestyle interventions. consider weight watchers   Tracking is very helpful.   ROV in 4 months and we can do hga 1c at the  Visit   [place]. [name] M.D.

## 2015-12-23 ENCOUNTER — Ambulatory Visit (INDEPENDENT_AMBULATORY_CARE_PROVIDER_SITE_OTHER): Payer: PPO | Admitting: Internal Medicine

## 2015-12-23 ENCOUNTER — Encounter: Payer: Self-pay | Admitting: Internal Medicine

## 2015-12-23 VITALS — BP 138/80 | Temp 98.6°F | Wt 272.3 lb

## 2015-12-23 DIAGNOSIS — R7303 Prediabetes: Secondary | ICD-10-CM

## 2015-12-23 DIAGNOSIS — R233 Spontaneous ecchymoses: Secondary | ICD-10-CM

## 2015-12-23 DIAGNOSIS — Z7901 Long term (current) use of anticoagulants: Secondary | ICD-10-CM | POA: Diagnosis not present

## 2015-12-23 NOTE — Patient Instructions (Addendum)
Intensify lifestyle interventions. consider weight watchers   Tracking is very helpful.   ROV in 4 months and we can do hga 1c at the  Visit

## 2015-12-23 NOTE — Assessment & Plan Note (Signed)
A1c back in the early diabetic range. Patient wants to delay adding medicine such as metformin discussed strategies dietary eating out. Are OV in 4 months can do the A1c at the visit.

## 2015-12-27 ENCOUNTER — Encounter: Payer: Self-pay | Admitting: Internal Medicine

## 2016-01-16 DIAGNOSIS — G4733 Obstructive sleep apnea (adult) (pediatric): Secondary | ICD-10-CM | POA: Diagnosis not present

## 2016-01-28 ENCOUNTER — Encounter (INDEPENDENT_AMBULATORY_CARE_PROVIDER_SITE_OTHER): Payer: Self-pay

## 2016-01-28 ENCOUNTER — Encounter: Payer: Self-pay | Admitting: Internal Medicine

## 2016-01-28 ENCOUNTER — Ambulatory Visit (INDEPENDENT_AMBULATORY_CARE_PROVIDER_SITE_OTHER): Payer: PPO | Admitting: Internal Medicine

## 2016-01-28 VITALS — BP 132/80 | HR 73 | Ht 66.5 in | Wt 271.8 lb

## 2016-01-28 DIAGNOSIS — I495 Sick sinus syndrome: Secondary | ICD-10-CM | POA: Diagnosis not present

## 2016-01-28 DIAGNOSIS — I10 Essential (primary) hypertension: Secondary | ICD-10-CM | POA: Diagnosis not present

## 2016-01-28 DIAGNOSIS — I480 Paroxysmal atrial fibrillation: Secondary | ICD-10-CM | POA: Diagnosis not present

## 2016-01-28 DIAGNOSIS — I4891 Unspecified atrial fibrillation: Secondary | ICD-10-CM | POA: Diagnosis not present

## 2016-01-28 LAB — CUP PACEART INCLINIC DEVICE CHECK
Battery Impedance: 1100 Ohm
Battery Voltage: 2.79 V
Date Time Interrogation Session: 20170728085155
Implantable Lead Implant Date: 20081211
Implantable Lead Implant Date: 20081211
Implantable Lead Location: 753859
Implantable Lead Location: 753860
Lead Channel Impedance Value: 392 Ohm
Lead Channel Impedance Value: 447 Ohm
Lead Channel Pacing Threshold Amplitude: 0.5 V
Lead Channel Pacing Threshold Amplitude: 1.5 V
Lead Channel Pacing Threshold Pulse Width: 0.4 ms
Lead Channel Pacing Threshold Pulse Width: 0.4 ms
Lead Channel Sensing Intrinsic Amplitude: 0.6 mV
Lead Channel Sensing Intrinsic Amplitude: 12 mV
Lead Channel Setting Pacing Amplitude: 2 V
Lead Channel Setting Pacing Amplitude: 3 V
Lead Channel Setting Pacing Pulse Width: 0.4 ms
Lead Channel Setting Sensing Sensitivity: 2 mV
Pulse Gen Model: 5826
Pulse Gen Serial Number: 1999089

## 2016-01-28 NOTE — Progress Notes (Signed)
HPI [name] returns today for followup. She is a very pleasant 70 year old woman with coronary artery disease, stroke, paroxysmal atrial fibrillation, symptomatic tachycardia bradycardia syndrome, status post permanent pacemaker insertion. With a combination of CPAP and dofetilide, she is maintaining sinus rhythm very nicely. She denies palpitations. No chest pain or shortness of breath. She has started exercising 3 times a week. No palpitations. No Known Allergies   Current Outpatient Prescriptions  Medication Sig Dispense Refill  . aspirin EC 81 MG tablet Take 81 mg by mouth daily.    [name] atorvastatin (LIPITOR) 40 MG tablet TAKE 1 TABLET BY MOUTH EVERY DAY 30 tablet 10  . b complex vitamins tablet Take 1 tablet by mouth daily.    . cetirizine (ZYRTEC) 10 MG tablet Take 10 mg by mouth daily.    . Cholecalciferol (VITAMIN D-3) 5000 UNITS TABS Take 5,000 Units by mouth every morning.    . dabigatran (PRADAXA) 150 MG CAPS capsule Take 150 mg by mouth 2 (two) times daily.    . diazepam (VALIUM) 5 MG tablet Take 1-2 tablets (5-10 mg total) by mouth every 6 (six) hours as needed for muscle spasms. 60 tablet 0  . diltiazem (CARDIZEM CD) 180 MG 24 hr capsule TAKE 1 CAPSULE (180 MG TOTAL) BY MOUTH DAILY. 90 capsule 1  . dofetilide (TIKOSYN) 250 MCG capsule TAKE 1 CAPSULE (250 MCG TOTAL) BY MOUTH 2 (TWO) TIMES DAILY. 180 capsule 2  . FLUoxetine (PROZAC) 20 MG capsule TAKE ONE CAPSULE BY MOUTH EVERY MORNING 90 capsule 0  . furosemide (LASIX) 40 MG tablet TAKE 1 TABLET BY MOUTH TWICE A DAY 180 tablet 3  . KLOR-CON M20 20 MEQ tablet TAKE 1 TABLET BY MOUTH TWICE A DAY 180 tablet 3  . losartan (COZAAR) 100 MG tablet TAKE 1 TABLET BY MOUTH EVERY DAY 90 tablet 3  . Lysine HCl 1000 MG TABS Take 1 tablet by mouth daily as needed (for fever blisters).     . metoprolol succinate (TOPROL-XL) 100 MG 24 hr tablet Take 1 Tablet by Mouth every morning & 1/2 Tab in the evening with or Immediately following a meal. 135  tablet 2  . Multiple Vitamin (MULTIVITAMIN) tablet Take 1 tablet by mouth daily.      . Naproxen Sod-Diphenhydramine (ALEVE PM) 220-25 MG TABS Take 2 tablets by mouth at bedtime.    . naproxen sodium (ANAPROX) 220 MG tablet Take 440 mg by mouth every morning.     . nitroGLYCERIN (NITROSTAT) 0.4 MG SL tablet Place 0.4 mg under the tongue every 5 (five) minutes x 3 doses as needed for chest pain.    . valACYclovir (VALTREX) 1000 MG tablet Take 2 by mouth and repeat in 12 hours for cold sores 30 tablet 2  . vitamin B-12 (CYANOCOBALAMIN) 1000 MCG tablet Take 5,000 mcg by mouth daily.     . vitamin E 1000 UNIT capsule Take 1,000 Units by mouth daily.     No current facility-administered medications for this visit.      Past Medical History:  Diagnosis Date  . Anticoagulant long-term use    pradaxa  . Anxiety   . Arthritis    low back & both knees, Spondylolisthesis    . CAD (coronary artery disease) [id number]   post PTCA with bare-metal stenting to mid RCA in December 2004     . CHF (congestive heart failure) ([place])   . Chronic atrial fibrillation ([place]) 06/2007   Tachybradycardia pacemaker  . Chronic kidney disease  10% function - ?R, other kidney is compensating    . Depression   . Diplopia 06/19/2008   Qualifier: Diagnosis of  By: [name] MD, [name]   . Dysrhythmia    ATRIAL FIBRILATION  . Edema of lower extremity   . History of acute inferior wall MI   . History of CVA (cerebrovascular accident)   . Hyperlipidemia   . Hypertension   . Myocardial infarction ([place]) [id number]  . OSA on CPAP    last test- 2010  . Pacemaker   . Pneumonia 2014   tx. ----  [facility]  . Shortness of breath   . Stroke ([place]) [id number]  . TIA (transient ischemic attack)   . Transfusion history   . Unspecified hemorrhoids without mention of complication [id number]   Colonoscopy--Dr. Carlean Purl     ROS:   All systems reviewed and negative except as noted in the HPI.   Past Surgical  History:  Procedure Laterality Date  . APPENDECTOMY    . back surgeries     x2-3 by Dr Trenton Gammon  . BACK SURGERY    . CHOLECYSTECTOMY  1994  . CORONARY ANGIOPLASTY WITH STENT PLACEMENT  1998  . CORONARY STENT PLACEMENT     C stent  . DOPPLER ECHOCARDIOGRAPHY  2009  . LEFT AND RIGHT HEART CATHETERIZATION WITH CORONARY ANGIOGRAM N/A 10/06/2013   Procedure: LEFT AND RIGHT HEART CATHETERIZATION WITH CORONARY ANGIOGRAM;  Surgeon: Troy Sine, MD;  Location[facility] CATH LAB;  Service: Cardiovascular;  Laterality: N/A;  . permanent pacemaker  06/2007  . TOTAL ABDOMINAL HYSTERECTOMY  1984   2 surgeries endometriosis bso     Family History  Problem Relation Age of Onset  . Suicidality Father     suicide death pt was 3 yrs  . Arrhythmia Mother   . Hypertension Mother   . Heart attack Brother 46  . Heart disease Paternal Aunt   . Prostate cancer Maternal Grandfather   . Diabetes Paternal Grandfather     fathers side of the family  . Diabetes Mother      Social History   Social History  . Marital status: Married    Spouse name: N/A  . Number of children: 0  . Years of education: HS   Occupational History  . retired     previously worked [employer]  . Smoking status: Former Smoker    Packs/day: 1.00    Years: 5.00    Types: Cigarettes    Start date: 05/11/1978    Quit date: 07/03/1982  . Smokeless tobacco: Never Used  . Alcohol use Yes     Comment: once q 6 months-socailly-wine  . Drug use: No  . Sexual activity: Not Currently   Other Topics Concern  . 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   Married    Originally from [place] of two, high school education   Former smoker [phone] 1ppd   Hunting dogs 7    Retired from [employer] 2      BP 132/80   Pulse 73   Ht 5' 6.5" (1.689 m)   Wt 271 lb 12.8 oz (123.3 kg)   SpO2 93%   BMI  43.21 kg/m   Physical Exam:  Obese appearing 70 yo woman, NAD HEENT: Unremarkable Neck:  7 cm JVD, no thyromegally Lungs:  Clear with no wheezes, rales, or rhonchi. HEART:  Regular rate rhythm, no murmurs, no rubs, no clicks Abd:  soft, positive bowel sounds, no organomegally, no rebound, no guarding Ext:  2 plus pulses, 1+ peripheral edema, no cyanosis, no clubbing Skin:  No rashes no nodules Neuro:  CN II through XII intact, motor grossly intact  DEVICE  Normal device function.  See PaceArt for details.   Assess/Plan:  1. PAF - she is maintaining NSR very nicely. She will continue Dofetilide 2. HTN - her blood pressure is fairly well controlled.  3. PPM - her [employer] DDD PM is working normally. Will recheck in several months. 4. Obesity - she is encouraged to lose weight 5. Chronic diastolic heart failure - she has a little fluid excess. I have asked her to take additional lasix for the next 2 days.  Mikle Bosworth.D.

## 2016-01-28 NOTE — Patient Instructions (Addendum)
Medication Instructions:  Your physician recommends that you continue on your current medications as directed. Please refer to the Current Medication list given to you today.  Take an extra Furosemide the next 2 days   Labwork: None ordered   Testing/Procedures: None ordered   Follow-Up: Your physician wants you to follow-up in: 6 months with device clinic and 12 months with Dr Knox Saliva will receive a reminder letter in the mail two months in advance. If you don't receive a letter, please call our office to schedule the follow-up appointment.   Any Other Special Instructions Will Be Listed Below (If Applicable).     If you need a refill on your cardiac medications before your next appointment, please call your pharmacy.

## 2016-01-31 DIAGNOSIS — G4733 Obstructive sleep apnea (adult) (pediatric): Secondary | ICD-10-CM | POA: Diagnosis not present

## 2016-02-01 ENCOUNTER — Other Ambulatory Visit: Payer: Self-pay | Admitting: Cardiovascular Disease

## 2016-02-01 ENCOUNTER — Encounter: Payer: Self-pay | Admitting: Internal Medicine

## 2016-02-01 NOTE — Telephone Encounter (Signed)
[name]  Thayer Headings, MD at 10/06/2015 3:52 PM   dabigatran (PRADAXA) 150 MG CAPS capsule Take 150 mg by mouth 2 (two) times daily   Assessment / Plan:  1. Atrial fibrillation -  Maintaining NSR with Tikosyn.  On Pradaxa.  On Dilt .  Will get Mag and BMP  QTC is ok - 455 ms.

## 2016-02-02 DIAGNOSIS — M1712 Unilateral primary osteoarthritis, left knee: Secondary | ICD-10-CM | POA: Diagnosis not present

## 2016-02-16 DIAGNOSIS — G4733 Obstructive sleep apnea (adult) (pediatric): Secondary | ICD-10-CM | POA: Diagnosis not present

## 2016-02-28 ENCOUNTER — Other Ambulatory Visit: Payer: Self-pay | Admitting: Cardiovascular Disease

## 2016-03-14 ENCOUNTER — Other Ambulatory Visit: Payer: Self-pay | Admitting: Cardiovascular Disease

## 2016-03-15 ENCOUNTER — Other Ambulatory Visit: Payer: Self-pay | Admitting: Cardiovascular Disease

## 2016-03-15 ENCOUNTER — Other Ambulatory Visit: Payer: Self-pay | Admitting: Internal Medicine

## 2016-03-17 NOTE — Telephone Encounter (Signed)
Sent to the pharmacy by e-scribe for 90 days.  Coming in on 04/21/16 for follow up and A1C.

## 2016-03-18 DIAGNOSIS — G4733 Obstructive sleep apnea (adult) (pediatric): Secondary | ICD-10-CM | POA: Diagnosis not present

## 2016-03-26 ENCOUNTER — Emergency Department (HOSPITAL_COMMUNITY): Payer: PPO

## 2016-03-26 ENCOUNTER — Encounter (HOSPITAL_COMMUNITY): Payer: Self-pay | Admitting: Emergency Medicine

## 2016-03-26 ENCOUNTER — Inpatient Hospital Stay (HOSPITAL_COMMUNITY)
Admission: EM | Admit: 2016-03-26 | Discharge: 2016-04-04 | DRG: 291 | Disposition: A | Discharge status: Home / Self Care | Payer: PPO | Attending: Family Medicine | Admitting: Family Medicine

## 2016-03-26 DIAGNOSIS — I482 Chronic atrial fibrillation, unspecified: Secondary | ICD-10-CM

## 2016-03-26 DIAGNOSIS — Z950 Presence of cardiac pacemaker: Secondary | ICD-10-CM | POA: Diagnosis not present

## 2016-03-26 DIAGNOSIS — J209 Acute bronchitis, unspecified: Secondary | ICD-10-CM | POA: Diagnosis not present

## 2016-03-26 DIAGNOSIS — Z955 Presence of coronary angioplasty implant and graft: Secondary | ICD-10-CM | POA: Diagnosis not present

## 2016-03-26 DIAGNOSIS — Z809 Family history of malignant neoplasm, unspecified: Secondary | ICD-10-CM

## 2016-03-26 DIAGNOSIS — Z833 Family history of diabetes mellitus: Secondary | ICD-10-CM

## 2016-03-26 DIAGNOSIS — I110 Hypertensive heart disease with heart failure: Principal | ICD-10-CM | POA: Diagnosis present

## 2016-03-26 DIAGNOSIS — I481 Persistent atrial fibrillation: Secondary | ICD-10-CM | POA: Diagnosis not present

## 2016-03-26 DIAGNOSIS — R7989 Other specified abnormal findings of blood chemistry: Secondary | ICD-10-CM | POA: Diagnosis not present

## 2016-03-26 DIAGNOSIS — J811 Chronic pulmonary edema: Secondary | ICD-10-CM

## 2016-03-26 DIAGNOSIS — Z8673 Personal history of transient ischemic attack (TIA), and cerebral infarction without residual deficits: Secondary | ICD-10-CM

## 2016-03-26 DIAGNOSIS — I272 Pulmonary hypertension, unspecified: Secondary | ICD-10-CM | POA: Diagnosis present

## 2016-03-26 DIAGNOSIS — E119 Type 2 diabetes mellitus without complications: Secondary | ICD-10-CM

## 2016-03-26 DIAGNOSIS — G4733 Obstructive sleep apnea (adult) (pediatric): Secondary | ICD-10-CM | POA: Diagnosis not present

## 2016-03-26 DIAGNOSIS — I509 Heart failure, unspecified: Secondary | ICD-10-CM | POA: Diagnosis not present

## 2016-03-26 DIAGNOSIS — I495 Sick sinus syndrome: Secondary | ICD-10-CM | POA: Diagnosis not present

## 2016-03-26 DIAGNOSIS — Z8249 Family history of ischemic heart disease and other diseases of the circulatory system: Secondary | ICD-10-CM

## 2016-03-26 DIAGNOSIS — Z87891 Personal history of nicotine dependence: Secondary | ICD-10-CM | POA: Diagnosis not present

## 2016-03-26 DIAGNOSIS — I5031 Acute diastolic (congestive) heart failure: Secondary | ICD-10-CM | POA: Diagnosis not present

## 2016-03-26 DIAGNOSIS — J9601 Acute respiratory failure with hypoxia: Secondary | ICD-10-CM | POA: Diagnosis present

## 2016-03-26 DIAGNOSIS — R0902 Hypoxemia: Secondary | ICD-10-CM

## 2016-03-26 DIAGNOSIS — I480 Paroxysmal atrial fibrillation: Secondary | ICD-10-CM | POA: Diagnosis not present

## 2016-03-26 DIAGNOSIS — I4891 Unspecified atrial fibrillation: Secondary | ICD-10-CM | POA: Diagnosis present

## 2016-03-26 DIAGNOSIS — I2510 Atherosclerotic heart disease of native coronary artery without angina pectoris: Secondary | ICD-10-CM | POA: Diagnosis not present

## 2016-03-26 DIAGNOSIS — Z6841 Body Mass Index (BMI) 40.0 and over, adult: Secondary | ICD-10-CM

## 2016-03-26 DIAGNOSIS — E785 Hyperlipidemia, unspecified: Secondary | ICD-10-CM | POA: Diagnosis not present

## 2016-03-26 DIAGNOSIS — Z9049 Acquired absence of other specified parts of digestive tract: Secondary | ICD-10-CM | POA: Diagnosis not present

## 2016-03-26 DIAGNOSIS — I252 Old myocardial infarction: Secondary | ICD-10-CM | POA: Diagnosis not present

## 2016-03-26 DIAGNOSIS — I5033 Acute on chronic diastolic (congestive) heart failure: Secondary | ICD-10-CM | POA: Diagnosis present

## 2016-03-26 DIAGNOSIS — Z7982 Long term (current) use of aspirin: Secondary | ICD-10-CM

## 2016-03-26 DIAGNOSIS — I10 Essential (primary) hypertension: Secondary | ICD-10-CM | POA: Diagnosis not present

## 2016-03-26 DIAGNOSIS — J9600 Acute respiratory failure, unspecified whether with hypoxia or hypercapnia: Secondary | ICD-10-CM | POA: Diagnosis not present

## 2016-03-26 DIAGNOSIS — R0602 Shortness of breath: Secondary | ICD-10-CM

## 2016-03-26 DIAGNOSIS — R509 Fever, unspecified: Secondary | ICD-10-CM

## 2016-03-26 DIAGNOSIS — Z7901 Long term (current) use of anticoagulants: Secondary | ICD-10-CM

## 2016-03-26 DIAGNOSIS — I5032 Chronic diastolic (congestive) heart failure: Secondary | ICD-10-CM | POA: Diagnosis not present

## 2016-03-26 LAB — I-STAT CHEM 8, ED
BUN: 18 mg/dL (ref 6–20)
Calcium, Ion: 1.08 mmol/L — ABNORMAL LOW (ref 1.15–1.40)
Chloride: 97 mmol/L — ABNORMAL LOW (ref 101–111)
Creatinine, Ser: 0.9 mg/dL (ref 0.44–1.00)
Glucose, Bld: 135 mg/dL — ABNORMAL HIGH (ref 65–99)
HCT: 41 % (ref 36.0–46.0)
Hemoglobin: 13.9 g/dL (ref 12.0–15.0)
Potassium: 4 mmol/L (ref 3.5–5.1)
Sodium: 135 mmol/L (ref 135–145)
TCO2: 27 mmol/L (ref 0–100)

## 2016-03-26 LAB — CBC WITH DIFFERENTIAL/PLATELET
Basophils Absolute: 0.1 10*3/uL (ref 0.0–0.1)
Basophils Relative: 1 %
Eosinophils Absolute: 0.2 10*3/uL (ref 0.0–0.7)
Eosinophils Relative: 2 %
HCT: 39.3 % (ref 36.0–46.0)
Hemoglobin: 12.8 g/dL (ref 12.0–15.0)
Lymphocytes Relative: 14 %
Lymphs Abs: 1.7 10*3/uL (ref 0.7–4.0)
MCH: 30.3 pg (ref 26.0–34.0)
MCHC: 32.6 g/dL (ref 30.0–36.0)
MCV: 92.9 fL (ref 78.0–100.0)
Monocytes Absolute: 2 10*3/uL — ABNORMAL HIGH (ref 0.1–1.0)
Monocytes Relative: 17 %
Neutro Abs: 7.9 10*3/uL — ABNORMAL HIGH (ref 1.7–7.7)
Neutrophils Relative %: 66 %
Platelets: 248 10*3/uL (ref 150–400)
RBC: 4.23 MIL/uL (ref 3.87–5.11)
RDW: 16 % — ABNORMAL HIGH (ref 11.5–15.5)
WBC: 11.8 10*3/uL — ABNORMAL HIGH (ref 4.0–10.5)

## 2016-03-26 LAB — I-STAT ARTERIAL BLOOD GAS, ED
Acid-Base Excess: 3 mmol/L — ABNORMAL HIGH (ref 0.0–2.0)
Bicarbonate: 25.7 mmol/L (ref 20.0–28.0)
O2 Saturation: 90 %
Patient temperature: 98.6
TCO2: 27 mmol/L (ref 0–100)
pCO2 arterial: 33.6 mmHg (ref 32.0–48.0)
pH, Arterial: 7.492 — ABNORMAL HIGH (ref 7.350–7.450)
pO2, Arterial: 52 mmHg — ABNORMAL LOW (ref 83.0–108.0)

## 2016-03-26 LAB — BASIC METABOLIC PANEL
Anion gap: 10 (ref 5–15)
BUN: 15 mg/dL (ref 6–20)
CO2: 26 mmol/L (ref 22–32)
Calcium: 9.1 mg/dL (ref 8.9–10.3)
Chloride: 97 mmol/L — ABNORMAL LOW (ref 101–111)
Creatinine, Ser: 1.01 mg/dL — ABNORMAL HIGH (ref 0.44–1.00)
GFR calc Af Amer: >60 mL/min (ref >60)
GFR calc non Af Amer: 55 mL/min — ABNORMAL LOW (ref >60)
Glucose, Bld: 139 mg/dL — ABNORMAL HIGH (ref 65–99)
Potassium: 3.9 mmol/L (ref 3.5–5.1)
Sodium: 133 mmol/L — ABNORMAL LOW (ref 135–145)

## 2016-03-26 LAB — TROPONIN I: Troponin I: 0.04 ng/mL (ref <0.03)

## 2016-03-26 LAB — BRAIN NATRIURETIC PEPTIDE: B Natriuretic Peptide: 553.9 pg/mL — ABNORMAL HIGH (ref 0.0–100.0)

## 2016-03-26 LAB — I-STAT TROPONIN, ED: Troponin i, poc: 0.01 ng/mL (ref 0.00–0.08)

## 2016-03-26 LAB — MRSA PCR SCREENING: MRSA by PCR: NEGATIVE

## 2016-03-26 LAB — MAGNESIUM: Magnesium: 2 mg/dL (ref 1.7–2.4)

## 2016-03-26 MED ORDER — FLUOXETINE HCL 20 MG PO CAPS
20.0000 mg | ORAL_CAPSULE | Freq: Every morning | ORAL | Status: DC
  Administered 2016-03-27 – 2016-04-04 (×9): 20 mg via ORAL
  Filled 2016-03-26 (×9): qty 1

## 2016-03-26 MED ORDER — POLYETHYL GLYCOL-PROPYL GLYCOL 0.4-0.3 % OP GEL
Freq: Every day | OPHTHALMIC | Status: DC | PRN
  Filled 2016-03-26: qty 10

## 2016-03-26 MED ORDER — FUROSEMIDE 10 MG/ML IJ SOLN
40.0000 mg | Freq: Once | INTRAMUSCULAR | Status: AC
  Administered 2016-03-26: 40 mg via INTRAVENOUS
  Filled 2016-03-26: qty 4

## 2016-03-26 MED ORDER — DABIGATRAN ETEXILATE MESYLATE 150 MG PO CAPS
150.0000 mg | ORAL_CAPSULE | Freq: Two times a day (BID) | ORAL | Status: DC
  Administered 2016-03-26 – 2016-04-04 (×18): 150 mg via ORAL
  Filled 2016-03-26 (×18): qty 1

## 2016-03-26 MED ORDER — DILTIAZEM HCL ER COATED BEADS 180 MG PO CP24
180.0000 mg | ORAL_CAPSULE | Freq: Every day | ORAL | Status: DC
  Administered 2016-03-27 – 2016-04-04 (×9): 180 mg via ORAL
  Filled 2016-03-26 (×9): qty 1

## 2016-03-26 MED ORDER — SODIUM CHLORIDE 0.9% FLUSH
3.0000 mL | INTRAVENOUS | Status: DC | PRN
  Administered 2016-03-28: 3 mL via INTRAVENOUS
  Filled 2016-03-26: qty 3

## 2016-03-26 MED ORDER — DOFETILIDE 250 MCG PO CAPS
250.0000 ug | ORAL_CAPSULE | Freq: Two times a day (BID) | ORAL | Status: DC
  Administered 2016-03-26 – 2016-04-04 (×17): 250 ug via ORAL
  Filled 2016-03-26 (×21): qty 1

## 2016-03-26 MED ORDER — NAPROXEN 375 MG PO TABS
375.0000 mg | ORAL_TABLET | Freq: Two times a day (BID) | ORAL | Status: DC
  Administered 2016-03-27 – 2016-03-28 (×3): 375 mg via ORAL
  Filled 2016-03-26 (×3): qty 1

## 2016-03-26 MED ORDER — FUROSEMIDE 10 MG/ML IJ SOLN
40.0000 mg | Freq: Two times a day (BID) | INTRAMUSCULAR | Status: DC
  Administered 2016-03-27 – 2016-03-28 (×4): 40 mg via INTRAVENOUS
  Filled 2016-03-26 (×4): qty 4

## 2016-03-26 MED ORDER — SODIUM CHLORIDE 0.9 % IV SOLN: 250.0000 mL | INTRAVENOUS | Status: DC | PRN

## 2016-03-26 MED ORDER — METOPROLOL SUCCINATE ER 100 MG PO TB24: 100.0000 mg | ORAL_TABLET | Freq: Every day | ORAL | Status: DC

## 2016-03-26 MED ORDER — NAPROXEN SODIUM 275 MG PO TABS: 440.0000 mg | ORAL_TABLET | Freq: Two times a day (BID) | ORAL | Status: DC

## 2016-03-26 MED ORDER — POTASSIUM CHLORIDE CRYS ER 20 MEQ PO TBCR
20.0000 meq | EXTENDED_RELEASE_TABLET | Freq: Two times a day (BID) | ORAL | Status: DC
  Administered 2016-03-26 – 2016-04-04 (×18): 20 meq via ORAL
  Filled 2016-03-26 (×19): qty 1

## 2016-03-26 MED ORDER — ATORVASTATIN CALCIUM 40 MG PO TABS
40.0000 mg | ORAL_TABLET | Freq: Every day | ORAL | Status: DC
  Administered 2016-03-27 – 2016-04-04 (×9): 40 mg via ORAL
  Filled 2016-03-26 (×9): qty 1

## 2016-03-26 MED ORDER — ASPIRIN EC 81 MG PO TBEC
81.0000 mg | DELAYED_RELEASE_TABLET | Freq: Every day | ORAL | Status: DC
  Administered 2016-03-26 – 2016-04-03 (×9): 81 mg via ORAL
  Filled 2016-03-26 (×9): qty 1

## 2016-03-26 MED ORDER — ACETAMINOPHEN 325 MG PO TABS: 650.0000 mg | ORAL_TABLET | ORAL | Status: DC | PRN

## 2016-03-26 MED ORDER — ONDANSETRON HCL 4 MG/2ML IJ SOLN: 4.0000 mg | Freq: Four times a day (QID) | INTRAMUSCULAR | Status: DC | PRN

## 2016-03-26 MED ORDER — LOSARTAN POTASSIUM 50 MG PO TABS: 100.0000 mg | ORAL_TABLET | Freq: Every day | ORAL | Status: DC

## 2016-03-26 MED ORDER — SODIUM CHLORIDE 0.9% FLUSH
3.0000 mL | Freq: Two times a day (BID) | INTRAVENOUS | Status: DC
  Administered 2016-03-26 – 2016-03-29 (×6): 3 mL via INTRAVENOUS

## 2016-03-26 MED ORDER — METOPROLOL SUCCINATE ER 50 MG PO TB24
50.0000 mg | ORAL_TABLET | Freq: Every day | ORAL | Status: DC
  Administered 2016-03-26: 50 mg via ORAL
  Filled 2016-03-26: qty 1

## 2016-03-26 MED ORDER — ADULT MULTIVITAMIN W/MINERALS CH
1.0000 | ORAL_TABLET | Freq: Every day | ORAL | Status: DC
  Administered 2016-03-27 – 2016-03-28 (×2): 1 via ORAL
  Filled 2016-03-26 (×2): qty 1

## 2016-03-26 NOTE — ED Provider Notes (Signed)
[facility] DEPT Provider Note   CSN: [id number] Arrival date & time: 03/26/16  1549     History   Chief Complaint Chief Complaint  Patient presents with  . Shortness of Breath    HPI [name] is a 70 y.o. female.  The history is provided by the patient.  Shortness of Breath  This is a new problem. The average episode lasts 2 days. The problem occurs continuously.The problem has been gradually worsening. Associated symptoms include a fever (subjective), coryza, rhinorrhea, sore throat, cough, orthopnea and leg swelling. Pertinent negatives include no ear pain, no sputum production, no chest pain, no vomiting, no abdominal pain and no rash. Associated medical issues include heart failure.    Past Medical History:  Diagnosis Date  . Anticoagulant long-term use    pradaxa  . Anxiety   . Arthritis    low back & both knees, Spondylolisthesis    . CAD (coronary artery disease) 4098,1191   post PTCA with bare-metal stenting to mid RCA in December 2004     . CHF (congestive heart failure) ([place])   . Chronic atrial fibrillation ([place]) 06/2007   Tachybradycardia pacemaker  . Chronic kidney disease    10% function - ?R, other kidney is compensating    . Depression   . Diplopia 06/19/2008   Qualifier: Diagnosis of  By: [name] MD, [name]   . Dysrhythmia    ATRIAL FIBRILATION  . Edema of lower extremity   . History of acute inferior wall MI   . History of CVA (cerebrovascular accident)   . Hyperlipidemia   . Hypertension   . Myocardial infarction ([place]) [id number]  . OSA on CPAP    last test- 2010  . Pacemaker   . Pneumonia 2014   tx. ----  [facility]  . Shortness of breath   . Stroke ([place]) [id number]  . TIA (transient ischemic attack)   . Transfusion history   . Unspecified hemorrhoids without mention of complication 4/78/2956   Colonoscopy--Dr. Carlean Purl     Patient Active Problem List   Diagnosis Date Noted  . Atrial fibrillation with RVR ([place]) 03/26/2016    . Acute respiratory failure with hypoxia ([place]) 03/26/2016  . DDD (degenerative disc disease), lumbar 04/05/2015  . Degenerative disc disease, lumbar 04/05/2015  . Severe obesity (BMI >= 40) ([place]) 02/16/2015  . Fatty liver disease, nonalcoholic 21/30/8657  . Cough, persistent 04/03/2014  . Acute exacerbation of CHF (congestive heart failure) ([place]) 03/21/2014  . CHF (congestive heart failure) ([place]) 03/21/2014  . Chronic diastolic heart failure- grade 2 diastolic dysfunction by echo 10/03/13 10/07/2013  . Acute diastolic heart failure ([place]) 10/04/2013  . Pre-diabetes 09/24/2013  . Peripheral edema 08/05/2013  . Neoplasm of uncertain behavior of skin of back 11/09/2012  . Edema 11/04/2012  . Bleeding mole 11/04/2012  . Pulmonary hypertension ([place]) 06/11/2012  . Pneumonia 06/05/2012  . Hypoxia 06/05/2012  . Anemia 06/05/2012  . Hemorrhoids 05/11/2012  . Dyspepsia 05/11/2012  . Visit for preventive health examination 04/20/2011  . Positional vertigo 10/07/2010  . Anticoagulant long-term use   . HERPES LABIALIS 12/30/2009  . INTERTRIGO 12/30/2009  . OBESITY 06/02/2009  . Obstructive sleep apnea 10/12/2008  . HYPERLIPIDEMIA 09/15/2008  . SNORING 09/15/2008  . Essential hypertension 06/19/2008  . CAD- previous LAD and RCA stents- patent at cath 10/06/13 06/19/2008  . ATRIAL FIBRILLATION, PAROXYSMAL 06/19/2008  . BRADYCARDIA-TACHYCARDIA SYNDROME 06/19/2008  . CEREBRAL ANEURYSM 06/19/2008  . ALLERGIC RHINITIS 06/19/2008  . HYPERGLYCEMIA 06/19/2008  .  DEPRESSION, HX OF 06/19/2008  . CEREBROVASCULAR ACCIDENT, HX OF 06/19/2008  . PACEMAKER, PERMANENT 06/19/2008    Past Surgical History:  Procedure Laterality Date  . APPENDECTOMY    . back surgeries     x2-3 by Dr Trenton Gammon  . BACK SURGERY    . CHOLECYSTECTOMY  1994  . CORONARY ANGIOPLASTY WITH STENT PLACEMENT  1998  . CORONARY STENT PLACEMENT     C stent  . DOPPLER ECHOCARDIOGRAPHY  2009  . LEFT AND RIGHT HEART CATHETERIZATION WITH  CORONARY ANGIOGRAM N/A 10/06/2013   Procedure: LEFT AND RIGHT HEART CATHETERIZATION WITH CORONARY ANGIOGRAM;  Surgeon: Troy Sine, MD;  Location: [facility] CATH LAB;  Service: Cardiovascular;  Laterality: N/A;  . permanent pacemaker  06/2007  . TOTAL ABDOMINAL HYSTERECTOMY  1984   2 surgeries endometriosis bso    OB History    No data available       Home Medications    Prior to Admission medications   Medication Sig Start Date End Date Taking? Authorizing Provider  aspirin EC 81 MG tablet Take 81 mg by mouth at bedtime.    Yes Historical Provider, MD  atorvastatin (LIPITOR) 40 MG tablet TAKE 1 TABLET BY MOUTH EVERY DAY 03/14/16  Yes Thayer Headings, MD  Cyanocobalamin 2500 MCG TABS Take 2,500 mcg by mouth daily. Vitamin B12   Yes Historical Provider, MD  diltiazem (CARDIZEM CD) 180 MG 24 hr capsule TAKE 1 CAPSULE (180 MG TOTAL) BY MOUTH DAILY. 03/15/16  Yes Thayer Headings, MD  dofetilide (TIKOSYN) 250 MCG capsule TAKE 1 CAPSULE (250 MCG TOTAL) BY MOUTH 2 (TWO) TIMES DAILY. 02/28/16  Yes Evans Lance, MD  FLUoxetine (PROZAC) 20 MG capsule TAKE ONE CAPSULE BY MOUTH EVERY MORNING 03/17/16  Yes [name], MD  furosemide (LASIX) 40 MG tablet TAKE 1 TABLET BY MOUTH TWICE A DAY 04/19/15  Yes Thayer Headings, MD  KLOR-CON M20 20 MEQ tablet TAKE 1 TABLET BY MOUTH TWICE A DAY 05/20/15  Yes Thayer Headings, MD  losartan (COZAAR) 100 MG tablet Take 1 tablet (100 mg total) by mouth daily. 03/15/16  Yes Thayer Headings, MD  Lysine HCl 1000 MG TABS Take 1 tablet by mouth at bedtime.    Yes Historical Provider, MD  metoprolol succinate (TOPROL-XL) 100 MG 24 hr tablet Take 1 Tablet by Mouth every morning & 1/2 Tab in the evening with or Immediately following a meal. Patient taking differently: Take 50-100 mg by mouth See admin instructions. Take 1 tablet (100 mg) by mouth every morning and 1/2 tablet (50 mg) every evening - with or Immediately following a meal. 07/19/15  Yes Thayer Headings, MD  Multiple  Vitamin (MULTIVITAMIN WITH MINERALS) TABS tablet Take 1 tablet by mouth daily. Centrum   Yes Historical Provider, MD  naproxen sodium (ALEVE) 220 MG tablet Take 440 mg by mouth 2 (two) times daily with a meal.   Yes Historical Provider, MD  nitroGLYCERIN (NITROSTAT) 0.4 MG SL tablet Place 0.4 mg under the tongue every 5 (five) minutes x 3 doses as needed for chest pain.   Yes Historical Provider, MD  Polyethyl Glycol-Propyl Glycol (SYSTANE OP) Place 1 drop into both eyes daily as needed (dry eyes).   Yes Historical Provider, MD  PRADAXA 150 MG CAPS capsule TAKE 1 CAPSULE BY MOUTH EVERY 12 HOURS 02/01/16  Yes Thayer Headings, MD  PRESCRIPTION MEDICATION Inhale into the lungs at bedtime. CPAP   Yes Historical Provider, MD  Family History Family History  Problem Relation Age of Onset  . Suicidality Father     suicide death pt was 3 yrs  . Arrhythmia Mother   . Hypertension Mother   . Diabetes Mother   . Heart attack Brother 58  . Heart disease Paternal Aunt   . Prostate cancer Maternal Grandfather   . Diabetes Paternal Grandfather     fathers side of the family    Social History Social History  Substance Use Topics  . Smoking status: Former Smoker    Packs/day: 1.00    Years: 5.00    Types: Cigarettes    Start date: 05/11/1978    Quit date: 07/03/1982  . Smokeless tobacco: Never Used  . Alcohol use Yes     Comment: once q 6 months-socailly-wine     Allergies   Review of patient's allergies indicates no known allergies.   Review of Systems Review of Systems  Constitutional: Positive for fever (subjective). Negative for chills.  HENT: Positive for rhinorrhea and sore throat. Negative for ear pain.   Eyes: Negative for pain and visual disturbance.  Respiratory: Positive for cough and shortness of breath. Negative for sputum production.   Cardiovascular: Positive for orthopnea and leg swelling. Negative for chest pain and palpitations.  Gastrointestinal: Negative for abdominal  pain and vomiting.  Genitourinary: Negative for dysuria and hematuria.  Musculoskeletal: Negative for arthralgias and back pain.  Skin: Negative for color change and rash.  Neurological: Negative for seizures and syncope.  All other systems reviewed and are negative.    Physical Exam Updated Vital Signs BP 106/80   Pulse 87   Temp 99.5 F (37.5 C) (Axillary)   Resp (!) 27   Wt 265 lb 14 oz (120.6 kg)   SpO2 86%   BMI 42.27 kg/m   Physical Exam  Constitutional: She is oriented to person, place, and time. She appears well-developed and well-nourished. No distress.  HENT:  Head: Normocephalic and atraumatic.  Nose: Nose normal.  Eyes: Conjunctivae and EOM are normal. Pupils are equal, round, and reactive to light. Right eye exhibits no discharge. Left eye exhibits no discharge. No scleral icterus.  Neck: Normal range of motion. Neck supple.  Cardiovascular: Normal rate.  An irregularly irregular rhythm present. Exam reveals no gallop and no friction rub.   No murmur heard. Pulmonary/Chest: Effort normal. No stridor. Tachypnea noted. She has wheezes. She has rales in the right middle field, the right lower field and the left lower field.  Abdominal: Soft. She exhibits no distension. There is no tenderness.  Musculoskeletal: She exhibits no edema or tenderness.  Neurological: She is alert and oriented to person, place, and time.  Skin: Skin is warm and dry. No rash noted. She is not diaphoretic. No erythema.  Psychiatric: She has a normal mood and affect.  Vitals reviewed.    ED Treatments / Results  Labs (all labs ordered are listed, but only abnormal results are displayed) Labs Reviewed  CBC WITH DIFFERENTIAL/PLATELET - Abnormal; Notable for the following:       Result Value   WBC 11.8 (*)    RDW 16.0 (*)    Neutro Abs 7.9 (*)    Monocytes Absolute 2.0 (*)    All other components within normal limits  BASIC METABOLIC PANEL - Abnormal; Notable for the following:     Sodium 133 (*)    Chloride 97 (*)    Glucose, Bld 139 (*)    Creatinine, Ser 1.01 (*)  GFR calc non Af Amer 55 (*)    All other components within normal limits  BRAIN NATRIURETIC PEPTIDE - Abnormal; Notable for the following:    B Natriuretic Peptide 553.9 (*)    All other components within normal limits  TROPONIN I - Abnormal; Notable for the following:    Troponin I 0.04 (*)    All other components within normal limits  I-STAT ARTERIAL BLOOD GAS, ED - Abnormal; Notable for the following:    pH, Arterial 7.492 (*)    pO2, Arterial 52.0 (*)    Acid-Base Excess 3.0 (*)    All other components within normal limits  I-STAT CHEM 8, ED - Abnormal; Notable for the following:    Chloride 97 (*)    Glucose, Bld 135 (*)    Calcium, Ion 1.08 (*)    All other components within normal limits  MRSA PCR SCREENING  MAGNESIUM  BASIC METABOLIC PANEL  TROPONIN I  TROPONIN I  I-STAT TROPOININ, ED    EKG  EKG Interpretation  Date/Time:  Sunday March 26 2016 15:56:46 EDT Ventricular Rate:  113 PR Interval:    QRS Duration: 80 QT Interval:  342 QTC Calculation: 469 R Axis:   88 Text Interpretation:  Atrial fibrillation with rapid ventricular response Nonspecific ST abnormality Abnormal ECG Confirmed by [facility] MD, Mida Cory ([id number]) on 03/26/2016 5:31:19 PM       Radiology Dg Chest Port 1 View  Result Date: 03/26/2016 CLINICAL DATA:  Shortness of breath for 3 days. History of atrial fibrillation. EXAM: PORTABLE CHEST 1 VIEW COMPARISON:  PA and lateral chest 03/21/2014. FINDINGS: There is cardiomegaly and mild vascular congestion. No consolidative process, pneumothorax or effusion. Aortic atherosclerosis is identified. Pacing device is unchanged. IMPRESSION: Cardiomegaly and vascular congestion. Atherosclerosis. Electronically Signed   By: Inge Rise M.D.   On: 03/26/2016 16:48    Procedures Procedures (including critical care time)  Medications Ordered in ED Medications    diltiazem (CARDIZEM CD) 24 hr capsule 180 mg (not administered)  atorvastatin (LIPITOR) tablet 40 mg (not administered)  aspirin EC tablet 81 mg (81 mg Oral Given 03/26/16 2152)  FLUoxetine (PROZAC) capsule 20 mg (not administered)  losartan (COZAAR) tablet 100 mg (not administered)  metoprolol succinate (TOPROL-XL) 24 hr tablet 100 mg (not administered)  dabigatran (PRADAXA) capsule 150 mg (150 mg Oral Given 03/26/16 2152)  polyethylene glycol 0.4% and propylene glycol 0.3% (SYSTANE) ophthalmic gel (not administered)  multivitamin with minerals tablet 1 tablet (not administered)  potassium chloride SA (K-DUR,KLOR-CON) CR tablet 20 mEq (20 mEq Oral Given 03/26/16 2152)  furosemide (LASIX) injection 40 mg (not administered)  naproxen (NAPROSYN) tablet 375 mg (not administered)  dofetilide (TIKOSYN) capsule 250 mcg (250 mcg Oral Given 03/26/16 2315)  metoprolol succinate (TOPROL-XL) 24 hr tablet 50 mg (50 mg Oral Given 03/26/16 2152)  sodium chloride flush (NS) 0.9 % injection 3 mL (3 mLs Intravenous Given 03/26/16 2155)  sodium chloride flush (NS) 0.9 % injection 3 mL (not administered)  0.9 %  sodium chloride infusion (not administered)  acetaminophen (TYLENOL) tablet 650 mg (not administered)  ondansetron (ZOFRAN) injection 4 mg (not administered)  furosemide (LASIX) injection 40 mg (40 mg Intravenous Given 03/26/16 1645)     Initial Impression / Assessment and Plan / ED Course  I have reviewed the triage vital signs and the nursing notes.  Pertinent labs & imaging results that were available during my care of the patient were reviewed by me and considered in my medical decision making (  see chart for details).  Clinical Course    Workup consistent with CHF exacerbation. Patient required BiPAP due to lack of improvement on nasal cannula and significant increased work of breathing. Hypoxia and shortness of breath improved following BiPAP initiation. Patient given IV Lasix. A. fib RVR  resolved once on BiPAP.  No new ischemic EKG changes. Initial troponin negative.  Patient has been stable since. She will require admission to stepdown unit for continued management.   CRITICAL CARE Performed by: Grayce Sessions Maritta Kief Total critical care time: 35 minutes Critical care time was exclusive of separately billable procedures and treating other patients. Critical care was necessary to treat or prevent imminent or life-threatening deterioration. Critical care was time spent personally by me on the following activities: development of treatment plan with patient and/or surrogate as well as nursing, discussions with consultants, evaluation of patient's response to treatment, examination of patient, obtaining history from patient or surrogate, ordering and performing treatments and interventions, ordering and review of laboratory studies, ordering and review of radiographic studies, pulse oximetry and re-evaluation of patient's condition.   Final Clinical Impressions(s) / ED Diagnoses   Final diagnoses:  SOB (shortness of breath)  Acute on chronic diastolic congestive heart failure ([place])  Hypoxia    Disposition: Admit  Condition: stable    Fatima Blank, MD 03/27/16 [phone]

## 2016-03-26 NOTE — H&P (Signed)
History and Physical    [name] DDU:[id number] DOB: Mar 12, 1946 DOA: 03/26/2016   PCP: Lottie Dawson, MD Chief Complaint:  Chief Complaint  Patient presents with  . Shortness of Breath    HPI: [name] is a 70 y.o. female with medical history significant of CHF, A.Fib rhythm control with Tykosin, pacemaker.  Patient presents to the ED with SOB.  Symptoms onset Friday.  Symptoms progressively worsening since onset.  Worse with activity or laying down, better when sitting upright.  No associated weight gain or swelling more than baseline she says.  ED Course: CHF, put on BIPAP, also has A.Fib with initial RVR to 120s, improves with bipap down to 80s-100s.  Got 40 lasix.  Review of Systems: As per HPI otherwise 10 point review of systems negative.    Past Medical History:  Diagnosis Date  . Anticoagulant long-term use    pradaxa  . Anxiety   . Arthritis    low back & both knees, Spondylolisthesis    . CAD (coronary artery disease) 2376,2831   post PTCA with bare-metal stenting to mid RCA in December 2004     . CHF (congestive heart failure) ([place])   . Chronic atrial fibrillation ([place]) 06/2007   Tachybradycardia pacemaker  . Chronic kidney disease    10% function - ?R, other kidney is compensating    . Depression   . Diplopia 06/19/2008   Qualifier: Diagnosis of  By: [name] MD, [name]   . Dysrhythmia    ATRIAL FIBRILATION  . Edema of lower extremity   . History of acute inferior wall MI   . History of CVA (cerebrovascular accident)   . Hyperlipidemia   . Hypertension   . Myocardial infarction ([place]) [id number]  . OSA on CPAP    last test- 2010  . Pacemaker   . Pneumonia 2014   tx. ----  [facility]  . Shortness of breath   . Stroke ([place]) [id number]  . TIA (transient ischemic attack)   . Transfusion history   . Unspecified hemorrhoids without mention of complication 11/17/6158   Colonoscopy--Dr. Carlean Purl     Past Surgical History:  Procedure  Laterality Date  . APPENDECTOMY    . back surgeries     x2-3 by Dr Trenton Gammon  . BACK SURGERY    . CHOLECYSTECTOMY  1994  . CORONARY ANGIOPLASTY WITH STENT PLACEMENT  1998  . CORONARY STENT PLACEMENT     C stent  . DOPPLER ECHOCARDIOGRAPHY  2009  . LEFT AND RIGHT HEART CATHETERIZATION WITH CORONARY ANGIOGRAM N/A 10/06/2013   Procedure: LEFT AND RIGHT HEART CATHETERIZATION WITH CORONARY ANGIOGRAM;  Surgeon: Troy Sine, MD;  Location: [facility] CATH LAB;  Service: Cardiovascular;  Laterality: N/A;  . permanent pacemaker  06/2007  . TOTAL ABDOMINAL HYSTERECTOMY  1984   2 surgeries endometriosis bso     reports that she quit smoking about 33 years ago. Her smoking use included Cigarettes. She started smoking about 37 years ago. She has a 5.00 pack-year smoking history. She has never used smokeless tobacco. She reports that she drinks alcohol. She reports that she does not use drugs.  No Known Allergies  Family History  Problem Relation Age of Onset  . Suicidality Father     suicide death pt was 3 yrs  . Arrhythmia Mother   . Hypertension Mother   . Diabetes Mother   . Heart attack Brother 79  . Heart disease Paternal Aunt   . Prostate cancer Maternal Grandfather   .  Diabetes Paternal Grandfather     fathers side of the family      Prior to Admission medications   Medication Sig Start Date End Date Taking? Authorizing Provider  aspirin EC 81 MG tablet Take 81 mg by mouth at bedtime.    Yes Historical Provider, MD  atorvastatin (LIPITOR) 40 MG tablet TAKE 1 TABLET BY MOUTH EVERY DAY 03/14/16  Yes Thayer Headings, MD  Cyanocobalamin 2500 MCG TABS Take 2,500 mcg by mouth daily. Vitamin B12   Yes Historical Provider, MD  diltiazem (CARDIZEM CD) 180 MG 24 hr capsule TAKE 1 CAPSULE (180 MG TOTAL) BY MOUTH DAILY. 03/15/16  Yes Thayer Headings, MD  dofetilide (TIKOSYN) 250 MCG capsule TAKE 1 CAPSULE (250 MCG TOTAL) BY MOUTH 2 (TWO) TIMES DAILY. 02/28/16  Yes Evans Lance, MD  FLUoxetine (PROZAC)  20 MG capsule TAKE ONE CAPSULE BY MOUTH EVERY MORNING 03/17/16  Yes [name], MD  furosemide (LASIX) 40 MG tablet TAKE 1 TABLET BY MOUTH TWICE A DAY 04/19/15  Yes Thayer Headings, MD  KLOR-CON M20 20 MEQ tablet TAKE 1 TABLET BY MOUTH TWICE A DAY 05/20/15  Yes Thayer Headings, MD  losartan (COZAAR) 100 MG tablet Take 1 tablet (100 mg total) by mouth daily. 03/15/16  Yes Thayer Headings, MD  Lysine HCl 1000 MG TABS Take 1 tablet by mouth at bedtime.    Yes Historical Provider, MD  metoprolol succinate (TOPROL-XL) 100 MG 24 hr tablet Take 1 Tablet by Mouth every morning & 1/2 Tab in the evening with or Immediately following a meal. Patient taking differently: Take 50-100 mg by mouth See admin instructions. Take 1 tablet (100 mg) by mouth every morning and 1/2 tablet (50 mg) every evening - with or Immediately following a meal. 07/19/15  Yes Thayer Headings, MD  Multiple Vitamin (MULTIVITAMIN WITH MINERALS) TABS tablet Take 1 tablet by mouth daily. Centrum   Yes Historical Provider, MD  naproxen sodium (ALEVE) 220 MG tablet Take 440 mg by mouth 2 (two) times daily with a meal.   Yes Historical Provider, MD  nitroGLYCERIN (NITROSTAT) 0.4 MG SL tablet Place 0.4 mg under the tongue every 5 (five) minutes x 3 doses as needed for chest pain.   Yes Historical Provider, MD  Polyethyl Glycol-Propyl Glycol (SYSTANE OP) Place 1 drop into both eyes daily as needed (dry eyes).   Yes Historical Provider, MD  PRADAXA 150 MG CAPS capsule TAKE 1 CAPSULE BY MOUTH EVERY 12 HOURS 02/01/16  Yes Thayer Headings, MD  PRESCRIPTION MEDICATION Inhale into the lungs at bedtime. CPAP   Yes Historical Provider, MD    Physical Exam: Vitals:   03/26/16 1816 03/26/16 1832 03/26/16 1900 03/26/16 1921  BP: 123/73 114/59 (!) 120/53   Pulse: 94 76 78 82  Resp: 25 18  22   Temp:      TempSrc:      SpO2: 98% 98% 95% 95%      Constitutional: NAD, calm, comfortable Eyes: PERRL, lids and conjunctivae normal ENMT: Mucous membranes  are moist. Posterior pharynx clear of any exudate or lesions.Normal dentition.  Neck: normal, supple, no masses, no thyromegaly Respiratory: clear to auscultation bilaterally, no wheezing, no crackles. Normal respiratory effort. No accessory muscle use.  Cardiovascular: Irr, irr, rate controlled, 3+ peripheral edema L>R (patient says L always > R and this is the usual for her) Abdomen: no tenderness, no masses palpated. No hepatosplenomegaly. Bowel sounds positive.  Musculoskeletal: no clubbing / cyanosis. No  joint deformity upper and lower extremities. Good ROM, no contractures. Normal muscle tone.  Skin: no rashes, lesions, ulcers. No induration Neurologic: CN 2-12 grossly intact. Sensation intact, DTR normal. Strength 5/5 in all 4.  Psychiatric: Normal judgment and insight. Alert and oriented x 3. Normal mood.    Labs on Admission: I have personally reviewed following labs and imaging studies  CBC:  Recent Labs Lab 03/26/16 1630 03/26/16 1642  WBC 11.8*  --   NEUTROABS 7.9*  --   HGB 12.8 13.9  HCT 39.3 41.0  MCV 92.9  --   PLT 248  --    Basic Metabolic Panel:  Recent Labs Lab 03/26/16 1630 03/26/16 1642  NA 133* 135  K 3.9 4.0  CL 97* 97*  CO2 26  --   GLUCOSE 139* 135*  BUN 15 18  CREATININE 1.01* 0.90  CALCIUM 9.1  --    GFR: CrCl cannot be calculated (Unknown ideal weight.). Liver Function Tests: No results for input(s): AST, ALT, ALKPHOS, BILITOT, PROT, ALBUMIN in the last 168 hours. No results for input(s): LIPASE, AMYLASE in the last 168 hours. No results for input(s): AMMONIA in the last 168 hours. Coagulation Profile: No results for input(s): INR, PROTIME in the last 168 hours. Cardiac Enzymes: No results for input(s): CKTOTAL, CKMB, CKMBINDEX, TROPONINI in the last 168 hours. BNP (last 3 results) No results for input(s): PROBNP in the last 8760 hours. HbA1C: No results for input(s): HGBA1C in the last 72 hours. CBG: No results for input(s): GLUCAP  in the last 168 hours. Lipid Profile: No results for input(s): CHOL, HDL, LDLCALC, TRIG, CHOLHDL, LDLDIRECT in the last 72 hours. Thyroid Function Tests: No results for input(s): TSH, T4TOTAL, FREET4, T3FREE, THYROIDAB in the last 72 hours. Anemia Panel: No results for input(s): VITAMINB12, FOLATE, FERRITIN, TIBC, IRON, RETICCTPCT in the last 72 hours. Urine analysis:    Component Value Date/Time   COLORURINE yellow 06/01/2010 0841   APPEARANCEUR Clear 06/01/2010 0841   LABSPEC 1.015 06/01/2010 0841   PHURINE 8.0 06/01/2010 0841   GLUCOSEU NEGATIVE 06/08/2008 1621   HGBUR negative 06/01/2010 0841   BILIRUBINUR n 11/04/2012 1057   KETONESUR NEGATIVE 06/08/2008 1621   PROTEINUR n 11/04/2012 1057   PROTEINUR NEGATIVE 06/08/2008 1621   UROBILINOGEN 0.2 11/04/2012 1057   UROBILINOGEN 0.2 06/01/2010 0841   NITRITE n 11/04/2012 1057   NITRITE negative 06/01/2010 0841   LEUKOCYTESUR Negative 11/04/2012 1057   Sepsis Labs: @LABRCNTIP (procalcitonin:4,lacticidven:4) )No results found for this or any previous visit (from the past 240 hour(s)).   Radiological Exams on Admission: Dg Chest Port 1 View  Result Date: 03/26/2016 CLINICAL DATA:  Shortness of breath for 3 days. History of atrial fibrillation. EXAM: PORTABLE CHEST 1 VIEW COMPARISON:  PA and lateral chest 03/21/2014. FINDINGS: There is cardiomegaly and mild vascular congestion. No consolidative process, pneumothorax or effusion. Aortic atherosclerosis is identified. Pacing device is unchanged. IMPRESSION: Cardiomegaly and vascular congestion. Atherosclerosis. Electronically Signed   By: Inge Rise M.D.   On: 03/26/2016 16:48    EKG: Independently reviewed.  Assessment/Plan Principal Problem:   Acute diastolic heart failure (HCC) Active Problems:   Chronic diastolic heart failure- grade 2 diastolic dysfunction by echo 10/03/13   Atrial fibrillation with RVR (HCC)   CHF exacerbation ([place])    1. Acute on chronic diastolic  CHF - 1. CHF pathway 2. Lasix 40 iv BID 3. Daily BMP 4. Continue ARB 5. BIPAP PRN, wean as tolerated 6. Repeat echo since last was over  2 years ago 2. Acute respiratory failure with hypoxia - 1. CXR calling CHF as the cause 2. No PNA on CXR, no documented fever yet.  Keep an eye out for development of fever and start PNA treatment if she develops. 3. No NEW leg pain or unusual unilateral swelling, PE unlikely, patient on pradaxa. 3. A.Fib RVR - 1. Patient is usually rhythm controlled with tykosin.  Suspect conversion to A.Fib with some RVR is reason for her CHF exacerbation today 2. Continue beta blocker, Cardizem 3. Continue tykosin for now, but likely needs cards consult in AM if still in A.Fib regarding this   DVT prophylaxis: Pradaxa Code Status: Full Family Communication: Husband at bedside Consults called: None Admission status: Admit to inpatient   Etta Quill DO [facility] Pager [phone] from 7PM-7AM  If 7AM-7PM, please contact the day physician for the patient [email or web address] Password [facility]  03/26/2016, 8:03 PM

## 2016-03-26 NOTE — ED Triage Notes (Signed)
Pt presents to ED for assessment of increased SOB since Friday.  Pt sts she had a "tickle in my throat".  Pt sts "it's moved to my chest".  Pt sts hx of CHF, pacemaker, a-fib, HTN.  O2 saturations low at triage, pt tachycardic as well.

## 2016-03-27 ENCOUNTER — Inpatient Hospital Stay (HOSPITAL_COMMUNITY): Payer: PPO

## 2016-03-27 DIAGNOSIS — J9600 Acute respiratory failure, unspecified whether with hypoxia or hypercapnia: Secondary | ICD-10-CM

## 2016-03-27 DIAGNOSIS — I5031 Acute diastolic (congestive) heart failure: Secondary | ICD-10-CM | POA: Diagnosis not present

## 2016-03-27 DIAGNOSIS — I5032 Chronic diastolic (congestive) heart failure: Secondary | ICD-10-CM | POA: Diagnosis not present

## 2016-03-27 DIAGNOSIS — R7989 Other specified abnormal findings of blood chemistry: Secondary | ICD-10-CM

## 2016-03-27 DIAGNOSIS — I4891 Unspecified atrial fibrillation: Secondary | ICD-10-CM

## 2016-03-27 DIAGNOSIS — R0602 Shortness of breath: Secondary | ICD-10-CM | POA: Diagnosis not present

## 2016-03-27 DIAGNOSIS — R05 Cough: Secondary | ICD-10-CM | POA: Diagnosis not present

## 2016-03-27 LAB — BASIC METABOLIC PANEL
Anion gap: 9 (ref 5–15)
BUN: 14 mg/dL (ref 6–20)
CO2: 27 mmol/L (ref 22–32)
Calcium: 8.7 mg/dL — ABNORMAL LOW (ref 8.9–10.3)
Chloride: 100 mmol/L — ABNORMAL LOW (ref 101–111)
Creatinine, Ser: 0.97 mg/dL (ref 0.44–1.00)
GFR calc Af Amer: >60 mL/min (ref >60)
GFR calc non Af Amer: 58 mL/min — ABNORMAL LOW (ref >60)
Glucose, Bld: 133 mg/dL — ABNORMAL HIGH (ref 65–99)
Potassium: 4 mmol/L (ref 3.5–5.1)
Sodium: 136 mmol/L (ref 135–145)

## 2016-03-27 LAB — TROPONIN I
Troponin I: <0.03 ng/mL (ref <0.03)
Troponin I: <0.03 ng/mL (ref <0.03)

## 2016-03-27 LAB — INFLUENZA PANEL BY PCR (TYPE A & B)
H1N1 flu by pcr: NOT DETECTED
Influenza A By PCR: NEGATIVE
Influenza B By PCR: NEGATIVE

## 2016-03-27 MED ORDER — METOPROLOL TARTRATE 5 MG/5ML IV SOLN
5.0000 mg | INTRAVENOUS | Status: DC | PRN
  Administered 2016-03-31: 5 mg via INTRAVENOUS
  Filled 2016-03-27: qty 5

## 2016-03-27 MED ORDER — METOPROLOL SUCCINATE ER 100 MG PO TB24
100.0000 mg | ORAL_TABLET | Freq: Two times a day (BID) | ORAL | Status: DC
  Administered 2016-03-27 – 2016-04-04 (×17): 100 mg via ORAL
  Filled 2016-03-27 (×18): qty 1

## 2016-03-27 MED ORDER — LEVALBUTEROL HCL 0.63 MG/3ML IN NEBU
0.6300 mg | INHALATION_SOLUTION | Freq: Four times a day (QID) | RESPIRATORY_TRACT | Status: DC
  Administered 2016-03-27 – 2016-03-28 (×6): 0.63 mg via RESPIRATORY_TRACT
  Filled 2016-03-27 (×6): qty 3

## 2016-03-27 MED ORDER — DEXTROSE 5 % IV SOLN
1.0000 g | INTRAVENOUS | Status: AC
  Administered 2016-03-27 – 2016-04-02 (×7): 1 g via INTRAVENOUS
  Filled 2016-03-27 (×7): qty 10

## 2016-03-27 MED ORDER — LEVOFLOXACIN IN D5W 750 MG/150ML IV SOLN
750.0000 mg | INTRAVENOUS | Status: DC
  Administered 2016-03-27: 750 mg via INTRAVENOUS
  Filled 2016-03-27 (×2): qty 150

## 2016-03-27 MED ORDER — LOSARTAN POTASSIUM 50 MG PO TABS
50.0000 mg | ORAL_TABLET | Freq: Every day | ORAL | Status: DC
  Administered 2016-03-27 – 2016-04-04 (×9): 50 mg via ORAL
  Filled 2016-03-27 (×9): qty 1

## 2016-03-27 MED ORDER — ORAL CARE MOUTH RINSE
15.0000 mL | Freq: Two times a day (BID) | OROMUCOSAL | Status: DC
  Administered 2016-03-28 – 2016-04-01 (×6): 15 mL via OROMUCOSAL

## 2016-03-27 MED ORDER — OSELTAMIVIR PHOSPHATE 75 MG PO CAPS
75.0000 mg | ORAL_CAPSULE | Freq: Two times a day (BID) | ORAL | Status: DC
  Administered 2016-03-27 (×2): 75 mg via ORAL
  Filled 2016-03-27 (×3): qty 1

## 2016-03-27 NOTE — Consult Note (Signed)
Patient ID: [name] MRN: [id number], DOB/AGE: [birth year]   Admit date: 03/26/2016   Reason for Consult: CHF Requesting MD: Dr. Candiss Norse, Internal Medicine    Primary Physician: Lottie Dawson, MD Primary Cardiologist: N[place]  Electrophysiologist: Dr. Lovena Le   Pt. Profile:  70 y/o female with coronary artery disease, chronic diastolic dysfunction, stroke, paroxysmal atrial fibrillation on Tikosyn and Pradaxa, symptomatic tachycardia bradycardia syndrome, status post permanent pacemaker insertion and OSA compliant with CPAP, admitted for hypoxic respiratory failure, in the setting of a/c CHF and respiratory infection. ? Flu vs PNA. On droplet precaution.   Problem List  Past Medical History:  Diagnosis Date  . Anticoagulant long-term use    pradaxa  . Anxiety   . Arthritis    low back & both knees, Spondylolisthesis    . CAD (coronary artery disease) 0865,7846   post PTCA with bare-metal stenting to mid RCA in December 2004     . CHF (congestive heart failure) ([place])   . Chronic atrial fibrillation ([place]) 06/2007   Tachybradycardia pacemaker  . Chronic kidney disease    10% function - ?R, other kidney is compensating    . Depression   . Diplopia 06/19/2008   Qualifier: Diagnosis of  By: [name] MD, [name]   . Dysrhythmia    ATRIAL FIBRILATION  . Edema of lower extremity   . History of acute inferior wall MI   . History of CVA (cerebrovascular accident)   . Hyperlipidemia   . Hypertension   . Myocardial infarction ([place]) [id number]  . OSA on CPAP    last test- 2010  . Pacemaker   . Pneumonia 2014   tx. ----  [facility]  . Shortness of breath   . Stroke ([place]) [id number]  . TIA (transient ischemic attack)   . Transfusion history   . Unspecified hemorrhoids without mention of complication 9/62/9528   Colonoscopy--Dr. Carlean Purl     Past Surgical History:  Procedure Laterality Date  . APPENDECTOMY    . back surgeries     x2-3 by Dr Trenton Gammon  . BACK SURGERY      . CHOLECYSTECTOMY  1994  . CORONARY ANGIOPLASTY WITH STENT PLACEMENT  1998  . CORONARY STENT PLACEMENT     C stent  . DOPPLER ECHOCARDIOGRAPHY  2009  . LEFT AND RIGHT HEART CATHETERIZATION WITH CORONARY ANGIOGRAM N/A 10/06/2013   Procedure: LEFT AND RIGHT HEART CATHETERIZATION WITH CORONARY ANGIOGRAM;  Surgeon: Troy Sine, MD;  Location: [facility] C[facility] LAB;  Service: Cardiovascular;  Laterality: N/A;  . permanent pacemaker  06/2007  . TOTAL ABDOMINAL HYSTERECTOMY  1984   2 surgeries endometriosis bso     Allergies  No Known Allergies  HPI  70 y/o female with coronary artery disease, chronic diastolic dysfunction, stroke, paroxysmal atrial fibrillation on Tikosyn and Pradaxa, symptomatic tachycardia bradycardia syndrome, status post permanent pacemaker insertion, OSA compliant with CPAP, admitted for a/c CHF. She is followed Dr. Acie Fredrickson. Dr. Lovena Le follows her PPM in EP clinic. '  In 2004, she underwent PCI to her mid RCA. Her last 2D echo was in April 2015, which showed normal LVEF of 55-60%m, grade 2DD, mild MR and moderate TR noted. PA pressures were moderately to severely increased at 70 mm Hg. This resulted in a [facility] performed by Dr. Claiborne Billings on 10/06/2013. Cath showed preserved global LV function the estimated ejection fraction of 55% and evidence for a small area of mild mid-distal inferior hypocontractility.  No significant residual coronary obstructive disease with the  appearance of a patent stent in the LAD, normal left circumflex artery, and patent tandem stance in the RCA extending from the ostium to the mid segment. There was faint collateralization of a small RV marginal branch which is supplied via septal collaterals arising from the LAD. PA systolic pressure, by RHC was 57 mm.   She presented to the [facility] ED on 03/26/16 with complaint of progressively worsening dyspnea. She initially required BiPAP in the ED due to hypoxia. EKG showed afib w/ a RVR of 113 bpm. BNP was 553. Initial troponin  was minimally elevated at 0.04. 2nd and 3rd troponins both negative. She was also febrile with temp of 101 and white count of 11.8.  CXR shows cardiomegaly and vascular congestion and aortic atherosclerosis. No infiltrates on initial CXR. She is on IV rocephin and has been admitted by IM. Undergoing w/u for Flu. Cardiology consulted for CHF.   Pt reports that she first started feeling poorly 1 week ago. Mild dyspnea + itchy scratchy throat. Progression to more severe dyspnea, mainly with exertion, in addition to fever and chills. Over the last 2 days she has developed a wet, productive cough. She denies CP. She also reports that she felt she went out of rhythm, day of admission on 9/24. Her hands also feel swollen. Her rings are fitting tight. She reports full medication compliance. No known sick contacts.    Home Medications  Prior to Admission medications   Medication Sig Start Date End Date Taking? Authorizing Provider  aspirin EC 81 MG tablet Take 81 mg by mouth at bedtime.    Yes Historical Provider, MD  atorvastatin (LIPITOR) 40 MG tablet TAKE 1 TABLET BY MOUTH EVERY DAY 03/14/16  Yes Thayer Headings, MD  Cyanocobalamin 2500 MCG TABS Take 2,500 mcg by mouth daily. Vitamin B12   Yes Historical Provider, MD  diltiazem (CARDIZEM CD) 180 MG 24 hr capsule TAKE 1 CAPSULE (180 MG TOTAL) BY MOUTH DAILY. 03/15/16  Yes Thayer Headings, MD  dofetilide (TIKOSYN) 250 MCG capsule TAKE 1 CAPSULE (250 MCG TOTAL) BY MOUTH 2 (TWO) TIMES DAILY. 02/28/16  Yes Evans Lance, MD  FLUoxetine (PROZAC) 20 MG capsule TAKE ONE CAPSULE BY MOUTH EVERY MORNING 03/17/16  Yes [name], MD  furosemide (LASIX) 40 MG tablet TAKE 1 TABLET BY MOUTH TWICE A DAY 04/19/15  Yes Thayer Headings, MD  KLOR-CON M20 20 MEQ tablet TAKE 1 TABLET BY MOUTH TWICE A DAY 05/20/15  Yes Thayer Headings, MD  losartan (COZAAR) 100 MG tablet Take 1 tablet (100 mg total) by mouth daily. 03/15/16  Yes Thayer Headings, MD  Lysine HCl 1000 MG TABS  Take 1 tablet by mouth at bedtime.    Yes Historical Provider, MD  metoprolol succinate (TOPROL-XL) 100 MG 24 hr tablet Take 1 Tablet by Mouth every morning & 1/2 Tab in the evening with or Immediately following a meal. Patient taking differently: Take 50-100 mg by mouth See admin instructions. Take 1 tablet (100 mg) by mouth every morning and 1/2 tablet (50 mg) every evening - with or Immediately following a meal. 07/19/15  Yes Thayer Headings, MD  Multiple Vitamin (MULTIVITAMIN WITH MINERALS) TABS tablet Take 1 tablet by mouth daily. Centrum   Yes Historical Provider, MD  naproxen sodium (ALEVE) 220 MG tablet Take 440 mg by mouth 2 (two) times daily with a meal.   Yes Historical Provider, MD  nitroGLYCERIN (NITROSTAT) 0.4 MG SL tablet Place 0.4 mg under the tongue  every 5 (five) minutes x 3 doses as needed for chest pain.   Yes Historical Provider, MD  Polyethyl Glycol-Propyl Glycol (SYSTANE OP) Place 1 drop into both eyes daily as needed (dry eyes).   Yes Historical Provider, MD  PRADAXA 150 MG CAPS capsule TAKE 1 CAPSULE BY MOUTH EVERY 12 HOURS 02/01/16  Yes Thayer Headings, MD  PRESCRIPTION MEDICATION Inhale into the lungs at bedtime. CPAP   Yes Historical Provider, MD   Hospital Meds  . aspirin EC  81 mg Oral QHS  . atorvastatin  40 mg Oral Daily  . cefTRIAXone (ROCEPHIN)  IV  1 g Intravenous Q24H  . dabigatran  150 mg Oral Q12H  . diltiazem  180 mg Oral Daily  . dofetilide  250 mcg Oral BID  . FLUoxetine  20 mg Oral q morning - 10a  . furosemide  40 mg Intravenous BID  . losartan  50 mg Oral Daily  . metoprolol succinate  100 mg Oral BID  . multivitamin with minerals  1 tablet Oral Daily  . naproxen  375 mg Oral BID WC  . oseltamivir  75 mg Oral BID  . potassium chloride SA  20 mEq Oral BID  . sodium chloride flush  3 mL Intravenous Q12H   Family History  Family History  Problem Relation Age of Onset  . Suicidality Father     suicide death pt was 3 yrs  . Arrhythmia Mother   .  Hypertension Mother   . Diabetes Mother   . Heart attack Brother 58  . Heart disease Paternal Aunt   . Prostate cancer Maternal Grandfather   . Diabetes Paternal Grandfather     fathers side of the family    Social History  Social History   Social History  . Marital status: Married    Spouse name: N/A  . Number of children: 0  . Years of education: HS   Occupational History  . retired     previously worked [employer]  . Smoking status: Former Smoker    Packs/day: 1.00    Years: 5.00    Types: Cigarettes    Start date: 05/11/1978    Quit date: 07/03/1982  . Smokeless tobacco: Never Used  . Alcohol use Yes     Comment: once q 6 months-socailly-wine  . Drug use: No  . Sexual activity: Not Currently   Other Topics Concern  . 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   Married    Originally from [place] of two, high school education   Former smoker [phone] 1ppd   Hunting dogs 7    Retired from [employer] 2      Review of Systems General:  +chills, + fever, + night sweats no weight changes.  Cardiovascular:  No chest pain, + dyspnea on exertion, + edema, - orthopnea, palpitations, paroxysmal nocturnal dyspnea. Dermatological: No rash, lesions/masses Respiratory: No cough, dyspnea Urologic: No hematuria, dysuria Abdominal:   No nausea, vomiting, diarrhea, bright red blood per rectum, melena, or hematemesis Neurologic:  No visual changes, wkns, changes in mental status. All other systems reviewed and are otherwise negative except as noted above.  Physical Exam  Blood pressure 124/69, pulse (!) 128, temperature (!) 101.1 F (38.4 C), temperature source Oral, resp. rate (!) 26, weight 264 lb 8.8 oz (120 kg), SpO2  93 %.  General: Pleasant, NAD, moderately obese  Psych: Normal affect. Neuro: Alert and oriented X 3. Moves all extremities  spontaneously. HEENT: Normal  Neck: Supple without bruits or JVD. Lungs:  Resp regular and unlabored,  RLL crackles  Heart: irregularly irregular, normal rate no s3, s4, or murmurs. Abdomen: Soft, non-tender, non-distended, BS + x 4.  Extremities: No clubbing, cyanosis or edema. DP/PT/Radials 2+ and equal bilaterally.  Labs  Troponin [facility] of Care Test)  Recent Labs  03/26/16 1912  TROPIPOC 0.01    Recent Labs  03/26/16 2009 03/27/16 0202 03/27/16 0754  TROPONINI 0.04* <0.03 <0.03   Lab Results  Component Value Date   WBC 11.8 (H) 03/26/2016   HGB 13.9 03/26/2016   HCT 41.0 03/26/2016   MCV 92.9 03/26/2016   PLT 248 03/26/2016     Recent Labs Lab 03/27/16 0202  NA 136  K 4.0  CL 100*  CO2 27  BUN 14  CREATININE 0.97  CALCIUM 8.7*  GLUCOSE 133*   Lab Results  Component Value Date   CHOL 135 06/04/2015   HDL 42.00 06/04/2015   LDLCALC 69 06/04/2015   TRIG 119.0 06/04/2015   No results found for: DDIMER   Radiology/Studies  Dg Chest Port 1 View  Result Date: 03/26/2016 CLINICAL DATA:  Shortness of breath for 3 days. History of atrial fibrillation. EXAM: PORTABLE CHEST 1 VIEW COMPARISON:  PA and lateral chest 03/21/2014. FINDINGS: There is cardiomegaly and mild vascular congestion. No consolidative process, pneumothorax or effusion. Aortic atherosclerosis is identified. Pacing device is unchanged. IMPRESSION: Cardiomegaly and vascular congestion. Atherosclerosis. Electronically Signed   By: Inge Rise M.D.   On: 03/26/2016 16:48    ECG  Atrial fibrillation   Echocardiogram - pending   ASSESSMENT AND PLAN  Principal Problem:   Acute diastolic heart failure (HCC) Active Problems:   Chronic diastolic heart failure- grade 2 diastolic dysfunction by echo 10/03/13   Atrial fibrillation with RVR (HCC)   Acute respiratory failure with hypoxia ([place])   70 y/o female with coronary artery disease, chronic diastolic dysfunction, stroke, paroxysmal  atrial fibrillation on Tikosyn and Pradaxa, symptomatic tachycardia bradycardia syndrome, status post permanent pacemaker insertion and OSA compliant with CPAP, admitted for hypoxic respiratory failure, in the setting of a/c CHF and respiratory infection. ? Flu vs PNA. On droplet precaution.    1. Acute Respiratory Failure: off BiPAP, now on supplemental O2. Initial CXR negative for infiltrate, however patient now with RLL crackles, concerning for PNA. She spiked a fever earlier today with temp at 101. She had a slight leukocytosis at time of admit at 11.8. No f/u CBC was ordered today. She is on IV rocephin. Recommend repeating CXR given new physical exam findings. She is also undergoing w/u for Flu. Continue droplet precaution until she rules out. She is getting Tamiflu.   2. Acute on Chronic Diastolic HF: suspect acute CHF is also a contributing factor in regards to her dyspnea, along with respiratory infection. BNP abnormal at 553. Continue with plans for repeat echo. Prior echo in 2015 showed normal LVEF and Grade 2DD. Continue Lasix + strict I/Os and daily weights. Low sodium diet. Continue BB (cardioselective).   3. Atrial Fibrillation: her afib has been controlled with Tikosyn until recently. Pt felt that she went out of rhythm yesterday morning, 9/24. She remains in afib with CVR in the 90s, occasionally increasing into the low 100s. She is asymptomatic. Suspect underlying infectious process + a/c CHF contributing. Continue to treat underlying  illnesses and hopefully patient will convert back to NSR once acute illnesses resolve. Continue Pradaxa for a/c. Continue Tikosyn, Coreg and metoprolol. Monitor K while diuresing given Tikosyn use.   4. CAD: s/p previous stenting of her RCA in 2004. Last LHC in 2015 showed patent stent and no obstructive coronary disease. She denies any recent angina. No significant enzyme elevation. 2D echo pending. No ischemic w/u if normal echo.   5. Abnormal Troponin:  Initial troponin was minimally elevated at 0.04. 2nd and 3rd troponins both negative. Enzyme trend not c/w ACS. If echo is normal, will not pursue any ischemic w/u.   6. OSA: patient reports full medication compliance with CPAP.   7. PPM: implanted for symptomatic tachy/brady syndrome. Followed by Dr. Lovena Le.  MD to follow with further recommendations.   Signed, Lyda Jester, PA-C 03/27/2016, 2:59 PM   Attending Note:   The patient was seen and examined.  Agree with assessment and plan as noted above.  Changes made to the above note as needed.  Patient seen and independently examined with Lyda Jester, PA .   We discussed all aspects of the encounter. I agree with the assessment and plan as stated above.  1. Respiratory failure Aryani presents with a week long history of cough, congestion  Symptoms sound c/w a pneumonia or viral syndrome.  She has significant wheezing in her lower right lung field. I suspect that her underlying lung issue has worsened her pulmonary HTN and or her diastolic dysfunction   2. Atrial fib:   She has gone back into a-fib. Continue tikosyn and pradaxa. Will hopefully go back into NSR as her pulmonary issues improve. May need cardioversion  3. Pulmonary HTN:   Has moderate P-HTN , likely due to her diastolic dysfunction and her OSA, obesity Continue meds.   4. CAD :  stable  5. Pacer -     I have spent a total of 40 minutes with patient reviewing hospital  notes , telemetry, EKGs, labs and examining patient as well as establishing an assessment and plan that was discussed with the patient. > 50% of time was spent in direct patient care.    Thayer Headings, Brooke Bonito., MD, [facility] 03/27/2016, 3:35 PM [address]  [place] Pager [phone]

## 2016-03-27 NOTE — Progress Notes (Addendum)
PROGRESS NOTE                                                                                                                                                                                                             Patient Demographics:    [name], is a 70 y.o. female, DOB - [birth year], TFT:[id number]  Admit date - 03/26/2016   Admitting Physician Etta Quill, DO  Outpatient Primary MD for the patient is Lottie Dawson, MD  LOS - 1  Chief Complaint  Patient presents with  . Shortness of Breath       Brief Narrative   [name] is a 70 y.o. female with medical history significant of CHF, A.Fib rhythm control with Tykosin, pacemaker.  Patient presents to the ED with SOB.  Symptoms onset Friday.  Symptoms progressively worsening since onset.  Worse with activity or laying down, better when sitting upright.  No associated weight gain or swelling more than baseline she says. She was placed on BiPAP and admitted to stepdown for CHF exacerbation.     Subjective:    [name] today has, No headache, No chest pain, No abdominal pain - No Nausea, No new weakness tingling or numbness, Improved Cough - SOB.     Assessment  & Plan :     1. Acute hypoxic respiratory failure due to acute on chronic diastolic CHF. Last EF around 55% few years ago. She will be continued on IV Lasix, beta blocker , on BiPAP will try to wean off nasal cannula if she can tolerate, placed on salt and fluid restriction, monitor daily weights, intake and output. Repeat echocardiogram and cardiology to evaluate.    Filed Weights   03/26/16 2137 03/27/16 0313  Weight: 120.6 kg (265 lb 14 oz) 120 kg (264 lb 8.8 oz)    Addendum - temp 101 at 11.50am - ? Early CAP, rule out Influenza, pan culture, IV ABX and monitor.   2. Chronic A. fib with RVR. [place] vasc 2 score to score of at least 4. She is already on Tikosyn which will be  continued, Increased beta blocker dose, drop ARB to provide room for blood pressure, continue Pradaxa, cardiology to evaluate. She is also on aspirin we'll defer continuation of anticoagulation and aspirin to cardiology.  3. Hypertension. On beta blocker dose increased, drop  ARB dose to provide room.  4. Morbid obesity and obstructive sleep apnea. Follow with PCP, continue BiPAP at night.  5. Dyslipidemia. On statin continue.    Family Communication  :  husband  Code Status :  Full  Diet : Heart Healthy  Disposition Plan  :  Stepdown  Consults  :  Cards  Procedures  :    TTE   DVT Prophylaxis  :  Pradaxa  Lab Results  Component Value Date   PLT 248 03/26/2016    Inpatient Medications  Scheduled Meds: . aspirin EC  81 mg Oral QHS  . atorvastatin  40 mg Oral Daily  . dabigatran  150 mg Oral Q12H  . diltiazem  180 mg Oral Daily  . dofetilide  250 mcg Oral BID  . FLUoxetine  20 mg Oral q morning - 10a  . furosemide  40 mg Intravenous BID  . losartan  50 mg Oral Daily  . metoprolol succinate  100 mg Oral BID  . multivitamin with minerals  1 tablet Oral Daily  . naproxen  375 mg Oral BID WC  . potassium chloride SA  20 mEq Oral BID  . sodium chloride flush  3 mL Intravenous Q12H   Continuous Infusions:  PRN Meds:.sodium chloride, acetaminophen, metoprolol, ondansetron (ZOFRAN) IV, Polyethyl Glycol-Propyl Glycol, sodium chloride flush  Antibiotics  :    Anti-infectives    None         Objective:   Vitals:   03/27/16 0600 03/27/16 0700 03/27/16 0800 03/27/16 0950  BP: 121/73 126/87 118/75 109/66  Pulse: 82  69 (!) 112  Resp: (!) 27 (!) 24 (!) 24 (!) 23  Temp:   98.5 F (36.9 C)   TempSrc:   Oral   SpO2: 97% 96% 97% 91%  Weight:        Wt Readings from Last 3 Encounters:  03/27/16 120 kg (264 lb 8.8 oz)  01/28/16 123.3 kg (271 lb 12.8 oz)  12/23/15 123.5 kg (272 lb 4.8 oz)     Intake/Output Summary (Last 24 hours) at 03/27/16 1017 Last data  filed at 03/27/16 0958  Gross per 24 hour  Intake                0 ml  Output              525 ml  Net             -525 ml     Physical Exam  Awake Alert, Oriented X 3, No new F.N deficits, Normal affect .AT,PERRAL Supple Neck,No JVD, No cervical lymphadenopathy appriciated.  Symmetrical Chest wall movement, Good air movement bilaterally, +ve rales iRRR,No Gallops,Rubs or new Murmurs, No Parasternal Heave +ve B.Sounds, Abd Soft, No tenderness, No organomegaly appriciated, No rebound - guarding or rigidity. No Cyanosis, Clubbing or edema, No new Rash or bruise       Data Review:    CBC  Recent Labs Lab 03/26/16 1630 03/26/16 1642  WBC 11.8*  --   HGB 12.8 13.9  HCT 39.3 41.0  PLT 248  --   MCV 92.9  --   MCH 30.3  --   MCHC 32.6  --   RDW 16.0*  --   LYMPHSABS 1.7  --   MONOABS 2.0*  --   EOSABS 0.2  --   BASOSABS 0.1  --     Chemistries   Recent Labs Lab 03/26/16 1630 03/26/16 1642 03/26/16 2009 03/27/16 0202  NA 133*  135  --  136  K 3.9 4.0  --  4.0  CL 97* 97*  --  100*  CO2 26  --   --  27  GLUCOSE 139* 135*  --  133*  BUN 15 18  --  14  CREATININE 1.01* 0.90  --  0.97  CALCIUM 9.1  --   --  8.7*  MG  --   --  2.0  --    ------------------------------------------------------------------------------------------------------------------ No results for input(s): CHOL, HDL, LDLCALC, TRIG, CHOLHDL, LDLDIRECT in the last 72 hours.  Lab Results  Component Value Date   HGBA1C 6.7 (H) 12/20/2015   ------------------------------------------------------------------------------------------------------------------ No results for input(s): TSH, T4TOTAL, T3FREE, THYROIDAB in the last 72 hours.  Invalid input(s): FREET3 ------------------------------------------------------------------------------------------------------------------ No results for input(s): VITAMINB12, FOLATE, FERRITIN, TIBC, IRON, RETICCTPCT in the last 72 hours.  Coagulation profile No  results for input(s): INR, PROTIME in the last 168 hours.  No results for input(s): DDIMER in the last 72 hours.  Cardiac Enzymes  Recent Labs Lab 03/26/16 2009 03/27/16 0202 03/27/16 0754  TROPONINI 0.04* <0.03 <0.03   ------------------------------------------------------------------------------------------------------------------    Component Value Date/Time   BNP 553.9 (H) 03/26/2016 1630    Micro Results Recent Results (from the past 240 hour(s))  MRSA PCR Screening     Status: None   Collection Time: 03/26/16  9:40 PM  Result Value Ref Range Status   MRSA by PCR NEGATIVE NEGATIVE Final    Comment:        The GeneXpert MRSA Assay (FDA approved for NASAL specimens only), is one component of a comprehensive MRSA colonization surveillance program. It is not intended to diagnose MRSA infection nor to guide or monitor treatment for MRSA infections.     Radiology Reports  Dg Chest Port 1 View  Result Date: 03/26/2016 CLINICAL DATA:  Shortness of breath for 3 days. History of atrial fibrillation. EXAM: PORTABLE CHEST 1 VIEW COMPARISON:  PA and lateral chest 03/21/2014. FINDINGS: There is cardiomegaly and mild vascular congestion. No consolidative process, pneumothorax or effusion. Aortic atherosclerosis is identified. Pacing device is unchanged. IMPRESSION: Cardiomegaly and vascular congestion. Atherosclerosis. Electronically Signed   By: Inge Rise M.D.   On: 03/26/2016 16:48    Time Spent in minutes  30   Manpreet Kemmer K M.D on 03/27/2016 at 10:17 AM  Between 7am to 7pm - Pager - [phone]  After 7pm go to [email or web address] - password [facility]  [facility] -  Office  [phone]

## 2016-03-27 NOTE — Progress Notes (Signed)
Pharmacy Antibiotic Note  [name] is a 70 y.o. female admitted on 03/26/2016 with pneumonia.  Pharmacy has been consulted for levaquin dosing.  Tmax 101.1, wbc slightly elevated at 11.8, QTc 469 on tikosyn prior to admission. Patient continues on ceftriaxone, levaquin being added. Cultures have been sent.  Plan: Levaquin 750mg  IV q24 hours  Follow qtc closely with concomitant tikosyn Follow up culture results  Weight: 264 lb 8.8 oz (120 kg)  Temp (24hrs), Avg:99.3 F (37.4 C), Min:98.5 F (36.9 C), Max:101.1 F (38.4 C)   Recent Labs Lab 03/26/16 1630 03/26/16 1642 03/27/16 0202  WBC 11.8*  --   --   CREATININE 1.01* 0.90 0.97    Estimated Creatinine Clearance: 71.8 mL/min (by C-G formula based on SCr of 0.97 mg/dL).    No Known Allergies  Antimicrobials this admission: Ceftriaxone 9/25>> Levaquin 9/25>>  Dose adjustments this admission:   Microbiology results: Pending   Thank you for allowing pharmacy to be a part of this patient's care.  Erin Hearing PharmD., BCPS Clinical Pharmacist Pager [phone] 03/27/2016 1:13 PM

## 2016-03-27 NOTE — Progress Notes (Signed)
Pt has home cpap which will be set up by respiratory and hospital equipment will be removed.  RT will monitor.

## 2016-03-28 ENCOUNTER — Inpatient Hospital Stay (HOSPITAL_COMMUNITY): Payer: PPO

## 2016-03-28 DIAGNOSIS — Z6841 Body Mass Index (BMI) 40.0 and over, adult: Secondary | ICD-10-CM | POA: Diagnosis not present

## 2016-03-28 DIAGNOSIS — I110 Hypertensive heart disease with heart failure: Secondary | ICD-10-CM | POA: Diagnosis not present

## 2016-03-28 DIAGNOSIS — R7989 Other specified abnormal findings of blood chemistry: Secondary | ICD-10-CM | POA: Diagnosis not present

## 2016-03-28 DIAGNOSIS — I5033 Acute on chronic diastolic (congestive) heart failure: Secondary | ICD-10-CM | POA: Diagnosis not present

## 2016-03-28 DIAGNOSIS — I482 Chronic atrial fibrillation: Secondary | ICD-10-CM | POA: Diagnosis not present

## 2016-03-28 DIAGNOSIS — J9600 Acute respiratory failure, unspecified whether with hypoxia or hypercapnia: Secondary | ICD-10-CM | POA: Diagnosis not present

## 2016-03-28 DIAGNOSIS — E119 Type 2 diabetes mellitus without complications: Secondary | ICD-10-CM | POA: Diagnosis not present

## 2016-03-28 DIAGNOSIS — I495 Sick sinus syndrome: Secondary | ICD-10-CM | POA: Diagnosis not present

## 2016-03-28 DIAGNOSIS — I509 Heart failure, unspecified: Secondary | ICD-10-CM

## 2016-03-28 DIAGNOSIS — I4891 Unspecified atrial fibrillation: Secondary | ICD-10-CM | POA: Diagnosis not present

## 2016-03-28 DIAGNOSIS — J9601 Acute respiratory failure with hypoxia: Secondary | ICD-10-CM | POA: Diagnosis not present

## 2016-03-28 DIAGNOSIS — I2510 Atherosclerotic heart disease of native coronary artery without angina pectoris: Secondary | ICD-10-CM | POA: Diagnosis not present

## 2016-03-28 DIAGNOSIS — I481 Persistent atrial fibrillation: Secondary | ICD-10-CM | POA: Diagnosis not present

## 2016-03-28 DIAGNOSIS — I272 Pulmonary hypertension, unspecified: Secondary | ICD-10-CM | POA: Diagnosis not present

## 2016-03-28 DIAGNOSIS — Z7901 Long term (current) use of anticoagulants: Secondary | ICD-10-CM | POA: Diagnosis not present

## 2016-03-28 DIAGNOSIS — I5032 Chronic diastolic (congestive) heart failure: Secondary | ICD-10-CM | POA: Diagnosis not present

## 2016-03-28 DIAGNOSIS — I5031 Acute diastolic (congestive) heart failure: Secondary | ICD-10-CM | POA: Diagnosis not present

## 2016-03-28 DIAGNOSIS — I252 Old myocardial infarction: Secondary | ICD-10-CM | POA: Diagnosis not present

## 2016-03-28 LAB — ECHOCARDIOGRAM COMPLETE
Height: 67 in
Weight: 4250.47 oz

## 2016-03-28 LAB — URINALYSIS, ROUTINE W REFLEX MICROSCOPIC
Bilirubin Urine: NEGATIVE
Glucose, UA: NEGATIVE mg/dL
Hgb urine dipstick: NEGATIVE
Ketones, ur: NEGATIVE mg/dL
Leukocytes, UA: NEGATIVE
Nitrite: NEGATIVE
Protein, ur: NEGATIVE mg/dL
Specific Gravity, Urine: 1.015 (ref 1.005–1.030)
pH: 6.5 (ref 5.0–8.0)

## 2016-03-28 LAB — CBC
HCT: 40.3 % (ref 36.0–46.0)
Hemoglobin: 13.1 g/dL (ref 12.0–15.0)
MCH: 30.2 pg (ref 26.0–34.0)
MCHC: 32.5 g/dL (ref 30.0–36.0)
MCV: 92.9 fL (ref 78.0–100.0)
Platelets: 234 10*3/uL (ref 150–400)
RBC: 4.34 MIL/uL (ref 3.87–5.11)
RDW: 15.7 % — ABNORMAL HIGH (ref 11.5–15.5)
WBC: 11.7 10*3/uL — ABNORMAL HIGH (ref 4.0–10.5)

## 2016-03-28 LAB — BASIC METABOLIC PANEL
Anion gap: 12 (ref 5–15)
BUN: 15 mg/dL (ref 6–20)
CO2: 27 mmol/L (ref 22–32)
Calcium: 9 mg/dL (ref 8.9–10.3)
Chloride: 97 mmol/L — ABNORMAL LOW (ref 101–111)
Creatinine, Ser: 0.89 mg/dL (ref 0.44–1.00)
GFR calc Af Amer: >60 mL/min (ref >60)
GFR calc non Af Amer: >60 mL/min (ref >60)
Glucose, Bld: 141 mg/dL — ABNORMAL HIGH (ref 65–99)
Potassium: 4.1 mmol/L (ref 3.5–5.1)
Sodium: 136 mmol/L (ref 135–145)

## 2016-03-28 LAB — MAGNESIUM: Magnesium: 2 mg/dL (ref 1.7–2.4)

## 2016-03-28 MED ORDER — LEVALBUTEROL HCL 0.63 MG/3ML IN NEBU: 0.6300 mg | INHALATION_SOLUTION | Freq: Four times a day (QID) | RESPIRATORY_TRACT | Status: DC | PRN

## 2016-03-28 MED ORDER — LEVALBUTEROL HCL 0.63 MG/3ML IN NEBU
0.6300 mg | INHALATION_SOLUTION | Freq: Two times a day (BID) | RESPIRATORY_TRACT | Status: DC
  Administered 2016-03-29 – 2016-04-01 (×7): 0.63 mg via RESPIRATORY_TRACT
  Filled 2016-03-28 (×7): qty 3

## 2016-03-28 MED ORDER — DOXYCYCLINE HYCLATE 100 MG PO TABS
100.0000 mg | ORAL_TABLET | Freq: Two times a day (BID) | ORAL | Status: AC
  Administered 2016-03-28 – 2016-04-02 (×12): 100 mg via ORAL
  Filled 2016-03-28 (×12): qty 1

## 2016-03-28 MED ORDER — ADULT MULTIVITAMIN W/MINERALS CH
1.0000 | ORAL_TABLET | Freq: Every day | ORAL | Status: DC
  Administered 2016-03-29 – 2016-04-04 (×7): 1 via ORAL
  Filled 2016-03-28 (×7): qty 1

## 2016-03-28 NOTE — Discharge Instructions (Signed)
Information on my medicine - Pradaxa (dabigatran)  This medication education was reviewed with me or my healthcare representative as part of my discharge preparation.  The pharmacist that spoke with me during my hospital stay was:  Einar Grad, [facility]  Why was Pradaxa prescribed for you? Pradaxa was prescribed for you to reduce the risk of forming blood clots that cause a stroke if you have a medical condition called atrial fibrillation (a type of irregular heartbeat).    What do you Need to know about PradAXa? Take your Pradaxa TWICE DAILY - one capsule in the morning and one tablet in the evening with or without food.  It would be best to take the doses about the same time each day.  The capsules should not be broken, chewed or opened - they must be swallowed whole.  Do not store Pradaxa in other medication containers - once the bottle is opened the Pradaxa should be used within FOUR months; throw away any capsules that havent been by that time.  Take Pradaxa exactly as prescribed by your doctor.  DO NOT stop taking Pradaxa without talking to the doctor who prescribed the medication.  Stopping without other stroke prevention medication to take the place of Pradaxa may increase your risk of developing a clot that causes a stroke.  Refill your prescription before you run out.  After discharge, you should have regular check-up appointments with your healthcare provider that is prescribing your Pradaxa.  In the future your dose may need to be changed if your kidney function or weight changes by a significant amount.  What do you do if you miss a dose? If you miss a dose, take it as soon as you remember on the same day.  If your next dose is less than 6 hours away, skip the missed dose.  Do not take two doses of PRADAXA at the same time.  Important Safety Information A possible side effect of Pradaxa is bleeding. You should call your healthcare provider right away if you experience  any of the following: ? Bleeding from an injury or your nose that does not stop. ? Unusual colored urine (red or dark brown) or unusual colored stools (red or black). ? Unusual bruising for unknown reasons. ? A serious fall or if you hit your head (even if there is no bleeding).  Some medicines may interact with Pradaxa and might increase your risk of bleeding or clotting while on Pradaxa. To help avoid this, consult your healthcare provider or pharmacist prior to using any new prescription or non-prescription medications, including herbals, vitamins, non-steroidal anti-inflammatory drugs (NSAIDs) and supplements.  This website has more information on Pradaxa (dabigatran): [email or web address].com

## 2016-03-28 NOTE — Progress Notes (Signed)
PROGRESS NOTE  70 y/o female with coronary artery disease, chronic diastolic dysfunction, stroke, paroxysmal atrial fibrillation on Tikosyn and Pradaxa, symptomatic tachycardia bradycardia syndrome, status post permanent pacemaker insertion and OSA compliant with CPAP, admitted for hypoxic respiratory failure, in the setting of a/c CHF and respiratory infection. ? Flu vs PNA. On droplet precaution.   Subjective:   [name] is doing ok. Still in aFib with RVR   Objective:    Vital Signs:   Temp:  [97.9 F (36.6 C)-101.1 F (38.4 C)] 98.4 F (36.9 C) (09/26 0400) Pulse Rate:  [85-129] 85 (09/26 0741) Resp:  [17-27] 17 (09/26 0741) BP: (109-146)/(57-128) 127/57 (09/26 0741) SpO2:  [89 %-99 %] 99 % (09/26 0830) FiO2 (%):  [50 %] 50 % (09/25 1549) Weight:  [265 lb 10.5 oz (120.5 kg)] 265 lb 10.5 oz (120.5 kg) (09/26 0259)  Last BM Date: 03/27/16   24-hour weight change: Weight change: -3.5 oz (-0.1 kg)  Weight trends: Filed Weights   03/26/16 2137 03/27/16 0313 03/28/16 0259  Weight: 265 lb 14 oz (120.6 kg) 264 lb 8.8 oz (120 kg) 265 lb 10.5 oz (120.5 kg)    Intake/Output:  09/25 0701 - 09/26 0700 In: 9242 [P.O.:840; IV Piggyback:200] Out: 1750 [Urine:1750] No intake/output data recorded.   Physical Exam: BP (!) 127/57 (BP Location: Left Arm)   Pulse 85   Temp 98.4 F (36.9 C) (Oral)   Resp 17   Ht 5\' 7"  (1.702 m) Comment: stated.   Wt 265 lb 10.5 oz (120.5 kg)   SpO2 99%   BMI 41.61 kg/m   Wt Readings from Last 3 Encounters:  03/28/16 265 lb 10.5 oz (120.5 kg)  01/28/16 271 lb 12.8 oz (123.3 kg)  12/23/15 272 lb 4.8 oz (123.5 kg)    General: Vital signs reviewed and noted.   Head: Normocephalic, atraumatic.  Eyes: conjunctivae/corneas clear.  EOM's intact.   Throat: normal  Neck:  normal   Lungs:    coarse rales - right base   Heart:  jirreg. Irreg.   Abdomen:  Soft, non-tender, non-distended    Extremities: Left calf is larger than the left calf.     Neurologic: A&O X3, CN II - XII are grossly intact.   Psych: Normal     Labs: BMET:  Recent Labs  03/26/16 2009 03/27/16 0202 03/28/16 0250  NA  --  136 136  K  --  4.0 4.1  CL  --  100* 97*  CO2  --  27 27  GLUCOSE  --  133* 141*  BUN  --  14 15  CREATININE  --  0.97 0.89  CALCIUM  --  8.7* 9.0  MG 2.0  --  2.0    Liver function tests: No results for input(s): AST, ALT, ALKPHOS, BILITOT, PROT, ALBUMIN in the last 72 hours. No results for input(s): LIPASE, AMYLASE in the last 72 hours.  CBC:  Recent Labs  03/26/16 1630 03/26/16 1642 03/28/16 0250  WBC 11.8*  --  11.7*  NEUTROABS 7.9*  --   --   HGB 12.8 13.9 13.1  HCT 39.3 41.0 40.3  MCV 92.9  --  92.9  PLT 248  --  234    Cardiac Enzymes:  Recent Labs  03/26/16 2009 03/27/16 0202 03/27/16 0754  TROPONINI 0.04* <0.03 <0.03    Coagulation Studies: No results for input(s): LABPROT, INR in the last 72 hours.  Other: Invalid input(s): POCBNP No results for input(s): DDIMER  in the last 72 hours. No results for input(s): HGBA1C in the last 72 hours. No results for input(s): CHOL, HDL, LDLCALC, TRIG, CHOLHDL in the last 72 hours. No results for input(s): TSH, T4TOTAL, T3FREE, THYROIDAB in the last 72 hours.  Invalid input(s): FREET3 No results for input(s): VITAMINB12, FOLATE, FERRITIN, TIBC, IRON, RETICCTPCT in the last 72 hours.   Other results:  EKG  ( personally reviewed )  - - A-fib with V rate of 127.   Medications:    Infusions:    Scheduled Medications: . aspirin EC  81 mg Oral QHS  . atorvastatin  40 mg Oral Daily  . cefTRIAXone (ROCEPHIN)  IV  1 g Intravenous Q24H  . dabigatran  150 mg Oral Q12H  . diltiazem  180 mg Oral Daily  . dofetilide  250 mcg Oral BID  . FLUoxetine  20 mg Oral q morning - 10a  . furosemide  40 mg Intravenous BID  . levalbuterol  0.63 mg Nebulization Q6H  . levofloxacin (LEVAQUIN) IV  750 mg Intravenous Q24H  . losartan  50 mg Oral Daily  . mouth  rinse  15 mL Mouth Rinse BID  . metoprolol succinate  100 mg Oral BID  . multivitamin with minerals  1 tablet Oral Daily  . naproxen  375 mg Oral BID WC  . potassium chloride SA  20 mEq Oral BID  . sodium chloride flush  3 mL Intravenous Q12H    Assessment/ Plan:   Principal Problem:   Acute diastolic heart failure (HCC) Active Problems:   Chronic diastolic heart failure- grade 2 diastolic dysfunction by echo 10/03/13   Atrial fibrillation with RVR (HCC)   Acute respiratory failure with hypoxia ([place])  1. Atrial fib:   Will consider increasing the Tikosyn up to 375 mg BID. Will get an ECG today  Have asked Dr. Rayann Heman about his opinion on this .  2. Pneumonia - ? Viral pneumonia  Has significant rales in the right base. Breathing is better.   Disposition:  Length of Stay: 2  Ramond Dial., MD, [facility] 03/28/2016, 9:25 AM Office [phone] Pager [phone]

## 2016-03-28 NOTE — Consult Note (Signed)
ELECTROPHYSIOLOGY CONSULT NOTE    Primary Care Physician: Lottie Dawson, MD Referring Physician:  Dr Acie Fredrickson  Admit Date: 03/26/2016  Reason for consultation:  AF  [name] is a 70 y.o. female with a h/o persistent atrial fibrillation, obesity, tachybrady syndrome s/p PPM and CAD.  She has been doing very well with her afib.  Per Dr Forde Dandy last office note, AF had been well controlled with tikosyn. She is now admitted with upper respiratory symptoms of cough ad SOB.  She has developed atrial fibrillation.  She thinks that this may have begun on Saturday with a cough.  She has been admitted and remains in afib.    Today, she denies symptoms of palpitations, chest pain,  orthopnea, PND, lower extremity edema, dizziness, presyncope, syncope, or neurologic sequela. The patient is tolerating medications without difficulties and is otherwise without complaint today.   Past Medical History:  Diagnosis Date  . Anticoagulant long-term use    pradaxa  . Anxiety   . Arthritis    low back & both knees, Spondylolisthesis    . CAD (coronary artery disease) [id number]   post PTCA with bare-metal stenting to mid RCA in December 2004     . CHF (congestive heart failure) ([place])   . Chronic atrial fibrillation ([place]) 06/2007   Tachybradycardia pacemaker  . Chronic kidney disease    10% function - ?R, other kidney is compensating    . Depression   . Diplopia 06/19/2008   Qualifier: Diagnosis of  By: [name] MD, [name]   . Dysrhythmia    ATRIAL FIBRILATION  . Edema of lower extremity   . History of acute inferior wall MI   . History of CVA (cerebrovascular accident)   . Hyperlipidemia   . Hypertension   . Myocardial infarction ([place]) [id number]  . OSA on CPAP    last test- 2010  . Pacemaker   . Pneumonia 2014   tx. ----  [facility]  . Shortness of breath   . Stroke ([place]) [id number]  . TIA (transient ischemic attack)   . Transfusion history   . Unspecified hemorrhoids without  mention of complication 2/69/4854   Colonoscopy--Dr. Carlean Purl    Past Surgical History:  Procedure Laterality Date  . APPENDECTOMY    . back surgeries     x2-3 by Dr Trenton Gammon  . BACK SURGERY    . CHOLECYSTECTOMY  1994  . CORONARY ANGIOPLASTY WITH STENT PLACEMENT  1998  . CORONARY STENT PLACEMENT     C stent  . DOPPLER ECHOCARDIOGRAPHY  2009  . LEFT AND RIGHT HEART CATHETERIZATION WITH CORONARY ANGIOGRAM N/A 10/06/2013   Procedure: LEFT AND RIGHT HEART CATHETERIZATION WITH CORONARY ANGIOGRAM;  Surgeon: Troy Sine, MD;  Location[facility] C[facility] LAB;  Service: Cardiovascular;  Laterality: N/A;  . permanent pacemaker  06/2007  . TOTAL ABDOMINAL HYSTERECTOMY  1984   2 surgeries endometriosis bso    . aspirin EC  81 mg Oral QHS  . atorvastatin  40 mg Oral Daily  . cefTRIAXone (ROCEPHIN)  IV  1 g Intravenous Q24H  . dabigatran  150 mg Oral Q12H  . diltiazem  180 mg Oral Daily  . dofetilide  250 mcg Oral BID  . doxycycline  100 mg Oral Q12H  . FLUoxetine  20 mg Oral q morning - 10a  . furosemide  40 mg Intravenous BID  . levalbuterol  0.63 mg Nebulization Q6H  . losartan  50 mg Oral Daily  . mouth rinse  15 mL  Mouth Rinse BID  . metoprolol succinate  100 mg Oral BID  . [START ON 03/29/2016] multivitamin with minerals  1 tablet Oral Daily  . potassium chloride SA  20 mEq Oral BID  . sodium chloride flush  3 mL Intravenous Q12H      No Known Allergies  Social History   Social History  . Marital status: Married    Spouse name: N/A  . Number of children: 0  . Years of education: HS   Occupational History  . retired     previously worked [employer]  . Smoking status: Former Smoker    Packs/day: 1.00    Years: 5.00    Types: Cigarettes    Start date: 05/11/1978    Quit date: 07/03/1982  . Smokeless tobacco: Never Used  . Alcohol use Yes     Comment: once q 6 months-socailly-wine  . Drug use: No  . Sexual activity: Not Currently   Other Topics  Concern  . 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   Married    Originally from [place] of two, high school education   Former smoker [phone] 1ppd   Hunting dogs 7    Retired from [employer]     Family History  Problem Relation Age of Onset  . Suicidality Father     suicide death pt was 3 yrs  . Arrhythmia Mother   . Hypertension Mother   . Diabetes Mother   . Heart attack Brother 24  . Heart disease Paternal Aunt   . Prostate cancer Maternal Grandfather   . Diabetes Paternal Grandfather     fathers side of the family    ROS- All systems are reviewed and negative except as per the HPI above  Physical Exam: Telemetry: afib, V rates mostly controlled Vitals:   03/28/16 0741 03/28/16 0830 03/28/16 1139 03/28/16 1558  BP: (!) 127/57  (!) 134/118 137/78  Pulse: 85  (!) 1 (!) 102  Resp: 17  17   Temp: 97.7 F (36.5 C)  98.3 F (36.8 C) 97.4 F (36.3 C)  TempSrc: Oral  Oral Oral  SpO2: 96% 99% 98% 98%  Weight:      Height:        GEN- The patient is overweight, alert and oriented x 3 today.   Head- normocephalic, atraumatic Eyes-  Sclera clear, conjunctiva pink Ears- hearing intact Oropharynx- clear Neck- supple,  Lungs- decreased BS, normal work of breathing Heart- irregular rate and rhythm, no murmurs, rubs or gallops, PMI not laterally displaced GI- soft, NT, ND, + BS Extremities- no clubbing, cyanosis, or edema MS- no significant deformity or atrophy Skin- no rash or lesion Psych- euthymic mood, full affect Neuro- strength and sensation are intact  EKG reviewed  Labs:   Lab Results  Component Value Date   WBC 11.7 (H) 03/28/2016   HGB 13.1 03/28/2016   HCT 40.3 03/28/2016   MCV 92.9 03/28/2016   PLT 234 03/28/2016    Recent Labs Lab 03/28/16 0250  NA 136  K 4.1  CL 97*  CO2 27  BUN 15  CREATININE 0.89  CALCIUM 9.0  GLUCOSE 141*    Lab Results  Component Value Date   TROPONINI <0.03 03/27/2016    Lab Results  Component Value Date   CHOL 135 06/04/2015   CHOL  136 11/19/2013   CHOL 121 09/09/2013   Lab Results  Component Value Date   HDL 42.00 06/04/2015   HDL 47.50 11/19/2013   HDL 45.80 09/09/2013   Lab Results  Component Value Date   LDLCALC 69 06/04/2015   LDLCALC 65 11/19/2013   LDLCALC 52 09/09/2013   Lab Results  Component Value Date   TRIG 119.0 06/04/2015   TRIG 117.0 11/19/2013   TRIG 116.0 09/09/2013   Lab Results  Component Value Date   CHOLHDL 3 06/04/2015   CHOLHDL 3 11/19/2013   CHOLHDL 3 09/09/2013   No results found for: LDLDIRECT    Radiology: reviewed  Echo: reviewed  ASSESSMENT AND PLAN:   1. Persistent afib Previously well controlled Currently with recurrence in the setting of respiratory illness I would favor conservative management at this time.  Continue current dose of medicines.  If she does not convert to sinus, may require cardioversion electively once clinically improved. Continue on pradaxa for chads2vasc score of at least 6. Would not increase tikosyn while on levaquin.  Would avoid levaquin and qt prolonging drugs if at all possible.  2. Tachy/brady S/p SJM PPM  Further cardiology/ EP management per Drs Nahser/ Noreene Larsson, MD 03/28/2016  6:25 PM

## 2016-03-28 NOTE — Progress Notes (Signed)
  Echocardiogram 2D Echocardiogram has been performed.  Aggie Cosier 03/28/2016, 10:35 AM

## 2016-03-28 NOTE — Progress Notes (Signed)
PROGRESS NOTE                                                                                                                                                                                                             Patient Demographics:    [name], is a 70 y.o. female, DOB - Dec 11, 1945, DGU:[id number]  Admit date - 03/26/2016   Admitting Physician Etta Quill, DO  Outpatient Primary MD for the patient is Lottie Dawson, MD  LOS - 2  Chief Complaint  Patient presents with  . Shortness of Breath       Brief Narrative   [name] is a 70 y.o. female with medical history significant of CHF, A.Fib rhythm control with Tykosin, pacemaker.  Patient presents to the ED with SOB.  Symptoms onset Friday.  Symptoms progressively worsening since onset.  Worse with activity or laying down, better when sitting upright.  No associated weight gain or swelling more than baseline she says. She was placed on BiPAP and admitted to stepdown for CHF exacerbation. However on the second day of admission she spiked a temperature of 101 raising the suspicion of possible underlying pneumonia versus UTI.   Subjective:    [name] today has, No headache, No chest pain, No abdominal pain - No Nausea, No new weakness tingling or numbness, Improved Cough - SOB.     Assessment  & Plan :     1. Acute hypoxic respiratory failure due to acute on chronic diastolic CHF. Last EF around 55% few years ago. She will be continued on IV Lasix, beta blocker , on BiPAP will try to wean off nasal cannula if she can tolerate, placed on salt and fluid restriction, monitor daily weights, intake and output. Repeat echocardiogram and cardiology to evaluate. So far -1.1 L.   Filed Weights   03/26/16 2137 03/27/16 0313 03/28/16 0259  Weight: 120.6 kg (265 lb 14 oz) 120 kg (264 lb 8.8 oz) 120.5 kg (265 lb 10.5 oz)    2. Fever on 03/27/2016 around 11:50  AM. Question early pneumonia surprisingly two-view chest x-ray remains clear, ruled out influenza, UA ordered has been pending, blood cultures pending, currently afebrile, has been started on empiric IV Rocephin and doxycycline to cover for early pneumonia and UTI. Was initially on Levaquin however since she is on Tikosyn  Levaquin has been switched to doxycycline.   3. Chronic A. fib with RVR. [place] vasc 2 score to score of at least 4. She is already on Tikosyn which will be continued, Increased beta blocker dose, drop ARB to provide room for blood pressure, continue Pradaxa, cardiology to evaluate. She is also on aspirin we'll defer continuation of anticoagulation and aspirin combination to cardiology.  4. Hypertension. On beta blocker dose increased, drop ARB dose to provide room.  5. Morbid obesity and obstructive sleep apnea. Follow with PCP, continue BiPAP at night.  6. Dyslipidemia. On statin continue.    Family Communication  :  husband  Code Status :  Full  Diet : Heart Healthy  Disposition Plan  :  Stepdown  Consults  :  Cards  Procedures  :    TTE   DVT Prophylaxis  :  Pradaxa  Lab Results  Component Value Date   PLT 234 03/28/2016    Inpatient Medications  Scheduled Meds: . aspirin EC  81 mg Oral QHS  . atorvastatin  40 mg Oral Daily  . cefTRIAXone (ROCEPHIN)  IV  1 g Intravenous Q24H  . dabigatran  150 mg Oral Q12H  . diltiazem  180 mg Oral Daily  . dofetilide  250 mcg Oral BID  . doxycycline  100 mg Oral Q12H  . FLUoxetine  20 mg Oral q morning - 10a  . furosemide  40 mg Intravenous BID  . levalbuterol  0.63 mg Nebulization Q6H  . losartan  50 mg Oral Daily  . mouth rinse  15 mL Mouth Rinse BID  . metoprolol succinate  100 mg Oral BID  . [START ON 03/29/2016] multivitamin with minerals  1 tablet Oral Daily  . potassium chloride SA  20 mEq Oral BID  . sodium chloride flush  3 mL Intravenous Q12H   Continuous Infusions:  PRN Meds:.sodium chloride,  acetaminophen, metoprolol, ondansetron (ZOFRAN) IV, Polyethyl Glycol-Propyl Glycol, sodium chloride flush  Antibiotics  :    Anti-infectives    Start     Dose/Rate Route Frequency Ordered Stop   03/28/16 1015  doxycycline (VIBRA-TABS) tablet 100 mg     100 mg Oral Every 12 hours 03/28/16 1002     03/27/16 2100  levofloxacin (LEVAQUIN) IVPB 750 mg  Status:  Discontinued     750 mg 100 mL/hr over 90 Minutes Intravenous Every 24 hours 03/27/16 2031 03/28/16 1002   03/27/16 1315  oseltamivir (TAMIFLU) capsule 75 mg  Status:  Discontinued     75 mg Oral 2 times daily 03/27/16 1306 03/28/16 0629   03/27/16 1230  cefTRIAXone (ROCEPHIN) 1 g in dextrose 5 % 50 mL IVPB     1 g 100 mL/hr over 30 Minutes Intravenous Every 24 hours 03/27/16 1156           Objective:   Vitals:   03/28/16 0400 03/28/16 0740 03/28/16 0741 03/28/16 0830  BP: (!) 134/99 (!) 146/128 (!) 127/57   Pulse: 93 (!) 109 85   Resp:   17   Temp: 98.4 F (36.9 C)     TempSrc: Oral     SpO2: 96% 92% 96% 99%  Weight:      Height:        Wt Readings from Last 3 Encounters:  03/28/16 120.5 kg (265 lb 10.5 oz)  01/28/16 123.3 kg (271 lb 12.8 oz)  12/23/15 123.5 kg (272 lb 4.8 oz)     Intake/Output Summary (Last 24 hours) at 03/28/16 1017 Last data  filed at 03/28/16 0900  Gross per 24 hour  Intake             1000 ml  Output             1600 ml  Net             -600 ml     Physical Exam  Awake Alert, Oriented X 3, No new F.N deficits, Normal affect [place].AT,PERRAL Supple Neck,No JVD, No cervical lymphadenopathy appriciated.  Symmetrical Chest wall movement, Good air movement bilaterally, +ve rales iRRR,No Gallops,Rubs or new Murmurs, No Parasternal Heave +ve B.Sounds, Abd Soft, No tenderness, No organomegaly appriciated, No rebound - guarding or rigidity. No Cyanosis, Clubbing or edema, No new Rash or bruise       Data Review:    CBC  Recent Labs Lab 03/26/16 1630 03/26/16 1642 03/28/16 0250  WBC  11.8*  --  11.7*  HGB 12.8 13.9 13.1  HCT 39.3 41.0 40.3  PLT 248  --  234  MCV 92.9  --  92.9  MCH 30.3  --  30.2  MCHC 32.6  --  32.5  RDW 16.0*  --  15.7*  LYMPHSABS 1.7  --   --   MONOABS 2.0*  --   --   EOSABS 0.2  --   --   BASOSABS 0.1  --   --     Chemistries   Recent Labs Lab 03/26/16 1630 03/26/16 1642 03/26/16 2009 03/27/16 0202 03/28/16 0250  NA 133* 135  --  136 136  K 3.9 4.0  --  4.0 4.1  CL 97* 97*  --  100* 97*  CO2 26  --   --  27 27  GLUCOSE 139* 135*  --  133* 141*  BUN 15 18  --  14 15  CREATININE 1.01* 0.90  --  0.97 0.89  CALCIUM 9.1  --   --  8.7* 9.0  MG  --   --  2.0  --  2.0   ------------------------------------------------------------------------------------------------------------------ No results for input(s): CHOL, HDL, LDLCALC, TRIG, CHOLHDL, LDLDIRECT in the last 72 hours.  Lab Results  Component Value Date   HGBA1C 6.7 (H) 12/20/2015   ------------------------------------------------------------------------------------------------------------------ No results for input(s): TSH, T4TOTAL, T3FREE, THYROIDAB in the last 72 hours.  Invalid input(s): FREET3 ------------------------------------------------------------------------------------------------------------------ No results for input(s): VITAMINB12, FOLATE, FERRITIN, TIBC, IRON, RETICCTPCT in the last 72 hours.  Coagulation profile No results for input(s): INR, PROTIME in the last 168 hours.  No results for input(s): DDIMER in the last 72 hours.  Cardiac Enzymes  Recent Labs Lab 03/26/16 2009 03/27/16 0202 03/27/16 0754  TROPONINI 0.04* <0.03 <0.03   ------------------------------------------------------------------------------------------------------------------    Component Value Date/Time   BNP 553.9 (H) 03/26/2016 1630    Micro Results Recent Results (from the past 240 hour(s))  MRSA PCR Screening     Status: None   Collection Time: 03/26/16  9:40 PM    Result Value Ref Range Status   MRSA by PCR NEGATIVE NEGATIVE Final    Comment:        The GeneXpert MRSA Assay (FDA approved for NASAL specimens only), is one component of a comprehensive MRSA colonization surveillance program. It is not intended to diagnose MRSA infection nor to guide or monitor treatment for MRSA infections.     Radiology Reports  Dg Chest 2 View  Result Date: 03/27/2016 CLINICAL DATA:  Fever for 1 day, dry cough and shortness of breath for 5 days. EXAM: CHEST  2 VIEW COMPARISON:  Chest x-rays dated 03/26/2016 and 03/20/2014. FINDINGS: Mild cardiomegaly is stable. There is stable central pulmonary vascular congestion and mild perihilar edema. No new lung findings. No confluent airspace opacity to suggest a consolidating pneumonia. No pleural effusion or pneumothorax seen. Atherosclerotic changes noted at the aortic arch. Left chest wall pacemaker/ICD in place. Osseous and soft tissue structures about the chest are otherwise unremarkable. IMPRESSION: 1. Cardiomegaly with central pulmonary vascular congestion suggesting mild CHF. Similar appearance seen on multiple prior chest x-rays suggesting a chronic mild CHF. 2. No new findings.  No evidence of pneumonia. 3. Aortic atherosclerosis. Electronically Signed   By: Franki Cabot M.D.   On: 03/27/2016 18:50   Dg Chest Port 1 View  Result Date: 03/26/2016 CLINICAL DATA:  Shortness of breath for 3 days. History of atrial fibrillation. EXAM: PORTABLE CHEST 1 VIEW COMPARISON:  PA and lateral chest 03/21/2014. FINDINGS: There is cardiomegaly and mild vascular congestion. No consolidative process, pneumothorax or effusion. Aortic atherosclerosis is identified. Pacing device is unchanged. IMPRESSION: Cardiomegaly and vascular congestion. Atherosclerosis. Electronically Signed   By: Inge Rise M.D.   On: 03/26/2016 16:48    Time Spent in minutes  30   SINGH,PRASHANT K M.D on 03/28/2016 at 10:17 AM  Between 7am to 7pm -  Pager - [phone]  After 7pm go to [email or web address] - password [facility]  [facility] -  Office  [phone]

## 2016-03-29 DIAGNOSIS — J9600 Acute respiratory failure, unspecified whether with hypoxia or hypercapnia: Secondary | ICD-10-CM | POA: Diagnosis not present

## 2016-03-29 DIAGNOSIS — R7989 Other specified abnormal findings of blood chemistry: Secondary | ICD-10-CM | POA: Diagnosis not present

## 2016-03-29 DIAGNOSIS — I5031 Acute diastolic (congestive) heart failure: Secondary | ICD-10-CM | POA: Diagnosis not present

## 2016-03-29 DIAGNOSIS — I5032 Chronic diastolic (congestive) heart failure: Secondary | ICD-10-CM | POA: Diagnosis not present

## 2016-03-29 DIAGNOSIS — I4891 Unspecified atrial fibrillation: Secondary | ICD-10-CM | POA: Diagnosis not present

## 2016-03-29 LAB — BASIC METABOLIC PANEL
Anion gap: 8 (ref 5–15)
BUN: 16 mg/dL (ref 6–20)
CO2: 30 mmol/L (ref 22–32)
Calcium: 8.9 mg/dL (ref 8.9–10.3)
Chloride: 99 mmol/L — ABNORMAL LOW (ref 101–111)
Creatinine, Ser: 1.01 mg/dL — ABNORMAL HIGH (ref 0.44–1.00)
GFR calc Af Amer: >60 mL/min (ref >60)
GFR calc non Af Amer: 55 mL/min — ABNORMAL LOW (ref >60)
Glucose, Bld: 137 mg/dL — ABNORMAL HIGH (ref 65–99)
Potassium: 4.5 mmol/L (ref 3.5–5.1)
Sodium: 137 mmol/L (ref 135–145)

## 2016-03-29 LAB — GLUCOSE, CAPILLARY
Glucose-Capillary: 130 mg/dL — ABNORMAL HIGH (ref 65–99)
Glucose-Capillary: 145 mg/dL — ABNORMAL HIGH (ref 65–99)
Glucose-Capillary: 132 mg/dL — ABNORMAL HIGH (ref 65–99)
Glucose-Capillary: 118 mg/dL — ABNORMAL HIGH (ref 65–99)
Glucose-Capillary: 161 mg/dL — ABNORMAL HIGH (ref 65–99)

## 2016-03-29 LAB — URINE CULTURE: Culture: NO GROWTH

## 2016-03-29 MED ORDER — LEVALBUTEROL HCL 0.63 MG/3ML IN NEBU: 0.6300 mg | INHALATION_SOLUTION | RESPIRATORY_TRACT | Status: DC | PRN

## 2016-03-29 MED ORDER — FUROSEMIDE 10 MG/ML IJ SOLN
60.0000 mg | Freq: Two times a day (BID) | INTRAMUSCULAR | Status: DC
  Administered 2016-03-29 – 2016-04-04 (×12): 60 mg via INTRAVENOUS
  Filled 2016-03-29 (×12): qty 6

## 2016-03-29 MED ORDER — INSULIN ASPART 100 UNIT/ML ~~LOC~~ SOLN: 0.0000 [IU] | Freq: Every day | SUBCUTANEOUS | Status: DC

## 2016-03-29 MED ORDER — FUROSEMIDE 10 MG/ML IJ SOLN: 60.0000 mg | Freq: Two times a day (BID) | INTRAMUSCULAR | Status: DC

## 2016-03-29 MED ORDER — INSULIN ASPART 100 UNIT/ML ~~LOC~~ SOLN
0.0000 [IU] | Freq: Three times a day (TID) | SUBCUTANEOUS | Status: DC
  Administered 2016-03-30: 2 [IU] via SUBCUTANEOUS
  Administered 2016-03-30 – 2016-04-04 (×9): 1 [IU] via SUBCUTANEOUS

## 2016-03-29 NOTE — Progress Notes (Signed)
PROGRESS NOTE  70 y/o female with coronary artery disease, chronic diastolic dysfunction, stroke, paroxysmal atrial fibrillation on Tikosyn and Pradaxa, symptomatic tachycardia bradycardia syndrome, status post permanent pacemaker insertion and OSA compliant with CPAP, admitted for hypoxic respiratory failure, in the setting of a/c CHF and respiratory infection. ? Flu vs PNA. On droplet precaution.   Subjective:   [name] is doing ok. Still in aFib with RVR  Appreciated Dr. Jackalyn Lombard assistance last night He would not recommend increasing Tikosyn at this point since she is on Levaquin.  Consider DC cardioversion once she is better.      Objective:    Vital Signs:   Temp:  [97.3 F (36.3 C)-98.6 F (37 C)] 98.5 F (36.9 C) (09/27 0756) Pulse Rate:  [1-141] 85 (09/27 0756) Resp:  [17-20] 20 (09/27 0756) BP: (110-137)/(67-118) 119/91 (09/27 0756) SpO2:  [94 %-99 %] 96 % (09/27 0756) Weight:  [264 lb 8 oz (120 kg)] 264 lb 8 oz (120 kg) (09/27 0400)  Last BM Date: 03/28/16   24-hour weight change: Weight change: -1 lb 2.5 oz (-0.524 kg)  Weight trends: Filed Weights   03/27/16 0313 03/28/16 0259 03/29/16 0400  Weight: 264 lb 8.8 oz (120 kg) 265 lb 10.5 oz (120.5 kg) 264 lb 8 oz (120 kg)    Intake/Output:  09/26 0701 - 09/27 0700 In: 550 [P.O.:550] Out: 1500 [Urine:1500] No intake/output data recorded.   Physical Exam: BP (!) 119/91 (BP Location: Left Wrist)   Pulse 85   Temp 98.5 F (36.9 C) (Oral)   Resp 20   Ht 5\' 7"  (1.702 m) Comment: stated.   Wt 264 lb 8 oz (120 kg)   SpO2 96%   BMI 41.43 kg/m   Wt Readings from Last 3 Encounters:  03/29/16 264 lb 8 oz (120 kg)  01/28/16 271 lb 12.8 oz (123.3 kg)  12/23/15 272 lb 4.8 oz (123.5 kg)    General: Vital signs reviewed and noted.   Head: Normocephalic, atraumatic.  Eyes: conjunctivae/corneas clear.  EOM's intact.   Throat: normal  Neck:  normal   Lungs:    coarse rales - right base   Heart:   jirreg. Irreg.   Abdomen:  Soft, non-tender, non-distended    Extremities: Left calf is larger than the left calf.    Neurologic: A&O X3, CN II - XII are grossly intact.   Psych: Normal     Labs: BMET:  Recent Labs  03/26/16 2009  03/28/16 0250 03/29/16 0227  NA  --   < > 136 137  K  --   < > 4.1 4.5  CL  --   < > 97* 99*  CO2  --   < > 27 30  GLUCOSE  --   < > 141* 137*  BUN  --   < > 15 16  CREATININE  --   < > 0.89 1.01*  CALCIUM  --   < > 9.0 8.9  MG 2.0  --  2.0  --   < > = values in this interval not displayed.  Liver function tests: No results for input(s): AST, ALT, ALKPHOS, BILITOT, PROT, ALBUMIN in the last 72 hours. No results for input(s): LIPASE, AMYLASE in the last 72 hours.  CBC:  Recent Labs  03/26/16 1630 03/26/16 1642 03/28/16 0250  WBC 11.8*  --  11.7*  NEUTROABS 7.9*  --   --   HGB 12.8 13.9 13.1  HCT 39.3 41.0 40.3  MCV 92.9  --  92.9  PLT 248  --  234    Cardiac Enzymes:  Recent Labs  03/26/16 2009 03/27/16 0202 03/27/16 0754  TROPONINI 0.04* <0.03 <0.03    Coagulation Studies: No results for input(s): LABPROT, INR in the last 72 hours.  Other: Invalid input(s): POCBNP No results for input(s): DDIMER in the last 72 hours. No results for input(s): HGBA1C in the last 72 hours. No results for input(s): CHOL, HDL, LDLCALC, TRIG, CHOLHDL in the last 72 hours. No results for input(s): TSH, T4TOTAL, T3FREE, THYROIDAB in the last 72 hours.  Invalid input(s): FREET3 No results for input(s): VITAMINB12, FOLATE, FERRITIN, TIBC, IRON, RETICCTPCT in the last 72 hours.   Other results:  EKG  ( personally reviewed )  - - A-fib with V rate of 108.   Medications:    Infusions:    Scheduled Medications: . aspirin EC  81 mg Oral QHS  . atorvastatin  40 mg Oral Daily  . cefTRIAXone (ROCEPHIN)  IV  1 g Intravenous Q24H  . dabigatran  150 mg Oral Q12H  . diltiazem  180 mg Oral Daily  . dofetilide  250 mcg Oral BID  . doxycycline   100 mg Oral Q12H  . FLUoxetine  20 mg Oral q morning - 10a  . furosemide  60 mg Intravenous BID  . levalbuterol  0.63 mg Nebulization BID  . losartan  50 mg Oral Daily  . mouth rinse  15 mL Mouth Rinse BID  . metoprolol succinate  100 mg Oral BID  . multivitamin with minerals  1 tablet Oral Daily  . potassium chloride SA  20 mEq Oral BID  . sodium chloride flush  3 mL Intravenous Q12H    Assessment/ Plan:   Principal Problem:   Acute diastolic heart failure (HCC) Active Problems:   Chronic diastolic heart failure- grade 2 diastolic dysfunction by echo 10/03/13   Atrial fibrillation with RVR (HCC)   Acute respiratory failure with hypoxia ([place])  1. Atrial fib:    Was seen by EP yesterday   will continue Tikosyn at the present dose - will avoid increaing while she is on Levaquin Consider DC cardioversion later this week once she is better from a respiratory standpoint    2. Pneumonia - ? Viral pneumonia  Has significant rales in the right base. Breathing is better.   Doing well with the nebs   Disposition:  Length of Stay: 3  Thayer Headings, Brooke Bonito., MD, [facility] 03/29/2016, 8:06 AM Office [phone] Pager [phone]

## 2016-03-29 NOTE — Progress Notes (Signed)
[name] TEAM 1 - Stepdown/ICU TEAM  [name]  EZM:[id number] DOB: 1945/10/16 DOA: 03/26/2016 PCP: Lottie Dawson, MD    Brief Narrative:  70 y.o.femalewith history of CHF, A.Fib on Tykosin, and pacemaker who presented to the ED with SOB which was worse with activity or laying down, better when sitting upright. She was placed on BiPAP and admitted to stepdown for a CHF exacerbation.  On the second day of admission she spiked a temperature of 101 raising the suspicion of possible underlying pneumonia.  Subjective: The patient is in good spirits.  She denies chest pain nausea vomiting or abdominal pain.  She continues to cough some and reports that the symptoms are consistent with her previous episodes of pneumonia.  Assessment & Plan:  Acute hypoxic respiratory failure due to acute exacerbation of diastolic CHF Net negative approximately 2.3 L thus far - remains modestly volume overloaded on exam - Lasix increased by cardiology today Filed Weights   03/27/16 0313 03/28/16 0259 03/29/16 0400  Weight: 120 kg (264 lb 8.8 oz) 120.5 kg (265 lb 10.5 oz) 120 kg (264 lb 8 oz)   FUO - suspected acute bronchitis  Chest x-ray without focal infiltrate but the patient does have right basilar crackles - UA unrevealing - continue empiric antibiotics   Chronic A. fib with RVR CHA2DS2 - VASc is 6 - Cardiology consulted and considering possible cardioversion in a.m. if persists  HTN Blood pressure currently controlled  Morbid obesity - Body mass index is 41.43 kg/m.  OSA  Hyperlipidemia Continue home medical therapy  DM2 Newly diagnosed in June 2017 - follow CBGs  DVT prophylaxis: Pradaxa Code Status: FULL CODE Family Communication: no family present at time of exam  Disposition Plan: SDU  Consultants:  [facility] Cardiology + EP  Procedures: none  Antimicrobials:  Ceftriaxone 9/25 > Doxycycline 9/26 > Levaquin 9/25  Objective: Blood pressure 123/61, pulse 90, temperature  98.5 F (36.9 C), temperature source Oral, resp. rate 18, height 5\' 7"  (1.702 m), weight 120 kg (264 lb 8 oz), SpO2 96 %.  Intake/Output Summary (Last 24 hours) at 03/29/16 1123 Last data filed at 03/28/16 2300  Gross per 24 hour  Intake              350 ml  Output             1200 ml  Net             -850 ml   Filed Weights   03/27/16 0313 03/28/16 0259 03/29/16 0400  Weight: 120 kg (264 lb 8.8 oz) 120.5 kg (265 lb 10.5 oz) 120 kg (264 lb 8 oz)    Examination: General: No acute respiratory distress Lungs: Right basilar crackles with good air movement throughout other fields without wheeze Cardiovascular: Irregularly irregular and mildly tachycardic without appreciable murmur Abdomen: Nontender, nondistended, soft, bowel sounds positive, no rebound, no ascites, no appreciable mass Extremities: No significant cyanosis, clubbing - 1+ edema bilateral lower extremities  CBC:  Recent Labs Lab 03/26/16 1630 03/26/16 1642 03/28/16 0250  WBC 11.8*  --  11.7*  NEUTROABS 7.9*  --   --   HGB 12.8 13.9 13.1  HCT 39.3 41.0 40.3  MCV 92.9  --  92.9  PLT 248  --  503   Basic Metabolic Panel:  Recent Labs Lab 03/26/16 1630 03/26/16 1642 03/26/16 2009 03/27/16 0202 03/28/16 0250 03/29/16 0227  NA 133* 135  --  136 136 137  K 3.9 4.0  --  4.0 4.1 4.5  CL 97* 97*  --  100* 97* 99*  CO2 26  --   --  27 27 30   GLUCOSE 139* 135*  --  133* 141* 137*  BUN 15 18  --  14 15 16   CREATININE 1.01* 0.90  --  0.97 0.89 1.01*  CALCIUM 9.1  --   --  8.7* 9.0 8.9  MG  --   --  2.0  --  2.0  --    GFR: Estimated Creatinine Clearance: 69.5 mL/min (by C-G formula based on SCr of 1.01 mg/dL (H)).  Cardiac Enzymes:  Recent Labs Lab 03/26/16 2009 03/27/16 0202 03/27/16 0754  TROPONINI 0.04* <0.03 <0.03    HbA1C: Hgb A1c MFr Bld  Date/Time Value Ref Range Status  12/20/2015 09:28 AM 6.7 (H) 4.6 - 6.5 % Final    Comment:    Glycemic Control Guidelines for People with Diabetes:Non  Diabetic:  <6%Goal of Therapy: <7%Additional Action Suggested:  >8%   06/04/2015 08:57 AM 6.5 4.6 - 6.5 % Final    Comment:    Glycemic Control Guidelines for People with Diabetes:Non Diabetic:  <6%Goal of Therapy: <7%Additional Action Suggested:  >8%     CBG:  Recent Labs Lab 03/29/16 0406 03/29/16 0758  GLUCAP 130* 145*    Recent Results (from the past 240 hour(s))  MRSA PCR Screening     Status: None   Collection Time: 03/26/16  9:40 PM  Result Value Ref Range Status   MRSA by PCR NEGATIVE NEGATIVE Final    Comment:        The GeneXpert MRSA Assay (FDA approved for NASAL specimens only), is one component of a comprehensive MRSA colonization surveillance program. It is not intended to diagnose MRSA infection nor to guide or monitor treatment for MRSA infections.   Culture, blood (routine x 2)     Status: None (Preliminary result)   Collection Time: 03/27/16  3:40 PM  Result Value Ref Range Status   Specimen Description BLOOD RIGHT HAND  Final   Special Requests IN PEDIATRIC BOTTLE 2CC  Final   Culture NO GROWTH < 24 HOURS  Final   Report Status PENDING  Incomplete  Culture, blood (routine x 2)     Status: None (Preliminary result)   Collection Time: 03/27/16  3:50 PM  Result Value Ref Range Status   Specimen Description BLOOD LEFT HAND  Final   Special Requests IN PEDIATRIC BOTTLE .[place]  Final   Culture NO GROWTH < 24 HOURS  Final   Report Status PENDING  Incomplete  Urine culture     Status: None   Collection Time: 03/28/16 10:50 AM  Result Value Ref Range Status   Specimen Description URINE, RANDOM  Final   Special Requests NONE  Final   Culture NO GROWTH  Final   Report Status 03/29/2016 FINAL  Final     Scheduled Meds: . aspirin EC  81 mg Oral QHS  . atorvastatin  40 mg Oral Daily  . cefTRIAXone (ROCEPHIN)  IV  1 g Intravenous Q24H  . dabigatran  150 mg Oral Q12H  . diltiazem  180 mg Oral Daily  . dofetilide  250 mcg Oral BID  . doxycycline  100 mg  Oral Q12H  . FLUoxetine  20 mg Oral q morning - 10a  . furosemide  60 mg Intravenous BID  . levalbuterol  0.63 mg Nebulization BID  . losartan  50 mg Oral Daily  . mouth rinse  15 mL  Mouth Rinse BID  . metoprolol succinate  100 mg Oral BID  . multivitamin with minerals  1 tablet Oral Daily  . potassium chloride SA  20 mEq Oral BID  . sodium chloride flush  3 mL Intravenous Q12H    LOS: 3 days   Cherene Altes, MD [facility] Office  [phone] Pager - Text Page per [name] as per below:  On-Call/Text Page:      [name]      password [email or web address]  If 7PM-7AM, please contact night-coverage [email or web address] Password [facility] 03/29/2016, 11:23 AM

## 2016-03-29 NOTE — Progress Notes (Signed)
Patient Name: [name] Date of Encounter: 03/29/2016     Principal Problem:   Acute diastolic heart failure [facility]) Active Problems:   Chronic diastolic heart failure- grade 2 diastolic dysfunction by echo 10/03/13   Atrial fibrillation with RVR (HCC)   Acute respiratory failure with hypoxia (HCC)    SUBJECTIVE  No chest pain. Still some dyspnea. CXR reviewed.   CURRENT MEDS . aspirin EC  81 mg Oral QHS  . atorvastatin  40 mg Oral Daily  . cefTRIAXone (ROCEPHIN)  IV  1 g Intravenous Q24H  . dabigatran  150 mg Oral Q12H  . diltiazem  180 mg Oral Daily  . dofetilide  250 mcg Oral BID  . doxycycline  100 mg Oral Q12H  . FLUoxetine  20 mg Oral q morning - 10a  . furosemide  60 mg Intravenous BID  . levalbuterol  0.63 mg Nebulization BID  . losartan  50 mg Oral Daily  . mouth rinse  15 mL Mouth Rinse BID  . metoprolol succinate  100 mg Oral BID  . multivitamin with minerals  1 tablet Oral Daily  . potassium chloride SA  20 mEq Oral BID  . sodium chloride flush  3 mL Intravenous Q12H    OBJECTIVE  Vitals:   03/29/16 0400 03/29/16 0404 03/29/16 0756 03/29/16 0806  BP: 110/69 110/69 (!) 119/91 (!) 119/91  Pulse: 97 100 85 90  Resp: 18  20 18   Temp: 97.3 F (36.3 C)  98.5 F (36.9 C)   TempSrc: Oral  Oral   SpO2: 97% 97% 96% 96%  Weight: 264 lb 8 oz (120 kg)     Height:        Intake/Output Summary (Last 24 hours) at 03/29/16 0818 Last data filed at 03/28/16 2300  Gross per 24 hour  Intake              550 ml  Output             1500 ml  Net             -950 ml   Filed Weights   03/27/16 0313 03/28/16 0259 03/29/16 0400  Weight: 264 lb 8.8 oz (120 kg) 265 lb 10.5 oz (120.5 kg) 264 lb 8 oz (120 kg)    PHYSICAL EXAM  General: Pleasant, NAD. Neuro: Alert and oriented X 3. Moves all extremities spontaneously. Psych: Normal affect. HEENT:  Normal  Neck: Supple without bruits or JVD. Lungs:  Resp regular and unlabored, CTA except for rales about 1/3 up  bilaterally. Heart: IRIR tachy with no s3 or murmurs. Abdomen: Soft, non-tender, non-distended, BS + x 4.  Extremities: No clubbing, cyanosis, trace peripheral edema. DP/PT/Radials 2+ and equal bilaterally.  Accessory Clinical Findings  CBC  Recent Labs  03/26/16 1630 03/26/16 1642 03/28/16 0250  WBC 11.8*  --  11.7*  NEUTROABS 7.9*  --   --   HGB 12.8 13.9 13.1  HCT 39.3 41.0 40.3  MCV 92.9  --  92.9  PLT 248  --  017   Basic Metabolic Panel  Recent Labs  03/26/16 2009  03/28/16 0250 03/29/16 0227  NA  --   < > 136 137  K  --   < > 4.1 4.5  CL  --   < > 97* 99*  CO2  --   < > 27 30  GLUCOSE  --   < > 141* 137*  BUN  --   < >  15 16  CREATININE  --   < > 0.89 1.01*  CALCIUM  --   < > 9.0 8.9  MG 2.0  --  2.0  --   < > = values in this interval not displayed. Liver Function Tests No results for input(s): AST, ALT, ALKPHOS, BILITOT, PROT, ALBUMIN in the last 72 hours. No results for input(s): LIPASE, AMYLASE in the last 72 hours. Cardiac Enzymes  Recent Labs  03/26/16 2009 03/27/16 0202 03/27/16 0754  TROPONINI 0.04* <0.03 <0.03   BNP Invalid input(s): POCBNP D-Dimer No results for input(s): DDIMER in the last 72 hours. Hemoglobin A1C No results for input(s): HGBA1C in the last 72 hours. Fasting Lipid Panel No results for input(s): CHOL, HDL, LDLCALC, TRIG, CHOLHDL, LDLDIRECT in the last 72 hours. Thyroid Function Tests No results for input(s): TSH, T4TOTAL, T3FREE, THYROIDAB in the last 72 hours.  Invalid input(s): FREET3  TELE  Atrial fib with a RVR  Radiology/Studies  Dg Chest 2 View  Result Date: 03/27/2016 CLINICAL DATA:  Fever for 1 day, dry cough and shortness of breath for 5 days. EXAM: CHEST  2 VIEW COMPARISON:  Chest x-rays dated 03/26/2016 and 03/20/2014. FINDINGS: Mild cardiomegaly is stable. There is stable central pulmonary vascular congestion and mild perihilar edema. No new lung findings. No confluent airspace opacity to suggest a  consolidating pneumonia. No pleural effusion or pneumothorax seen. Atherosclerotic changes noted at the aortic arch. Left chest wall pacemaker/ICD in place. Osseous and soft tissue structures about the chest are otherwise unremarkable. IMPRESSION: 1. Cardiomegaly with central pulmonary vascular congestion suggesting mild CHF. Similar appearance seen on multiple prior chest x-rays suggesting a chronic mild CHF. 2. No new findings.  No evidence of pneumonia. 3. Aortic atherosclerosis. Electronically Signed   By: Franki Cabot M.D.   On: 03/27/2016 18:50   Dg Chest Port 1 View  Result Date: 03/26/2016 CLINICAL DATA:  Shortness of breath for 3 days. History of atrial fibrillation. EXAM: PORTABLE CHEST 1 VIEW COMPARISON:  PA and lateral chest 03/21/2014. FINDINGS: There is cardiomegaly and mild vascular congestion. No consolidative process, pneumothorax or effusion. Aortic atherosclerosis is identified. Pacing device is unchanged. IMPRESSION: Cardiomegaly and vascular congestion. Atherosclerosis. Electronically Signed   By: Inge Rise M.D.   On: 03/26/2016 16:48    ASSESSMENT AND PLAN  1. Atrial fib with a RVR - I suspect this is related to her acute CHF/Bronchitis. Continue Tikosyn. We cannot increase dose however. I suspect she will go back to rhythm but if not, we will plan for DCCV tomorrow. 2. Acute on chronic diastolic heart failure - the patient is much better but still has some volume overload. Will increase lasix to 60 bid. 3. Bronchitis - she did not have pneumonia by CXR. Note elevated WBC. Agree with stopping flouroquinolones.  Carleene Overlie Jule Schlabach,M.D.  03/29/2016 8:18 AMPatient ID: [name], female   DOB: [birth year], 70 y.o.   MRN: [id number]

## 2016-03-30 DIAGNOSIS — I10 Essential (primary) hypertension: Secondary | ICD-10-CM | POA: Diagnosis not present

## 2016-03-30 DIAGNOSIS — E785 Hyperlipidemia, unspecified: Secondary | ICD-10-CM

## 2016-03-30 DIAGNOSIS — Z7901 Long term (current) use of anticoagulants: Secondary | ICD-10-CM | POA: Diagnosis not present

## 2016-03-30 DIAGNOSIS — I482 Chronic atrial fibrillation, unspecified: Secondary | ICD-10-CM

## 2016-03-30 DIAGNOSIS — R7989 Other specified abnormal findings of blood chemistry: Secondary | ICD-10-CM | POA: Diagnosis not present

## 2016-03-30 DIAGNOSIS — I110 Hypertensive heart disease with heart failure: Secondary | ICD-10-CM | POA: Diagnosis not present

## 2016-03-30 DIAGNOSIS — Z6841 Body Mass Index (BMI) 40.0 and over, adult: Secondary | ICD-10-CM | POA: Diagnosis not present

## 2016-03-30 DIAGNOSIS — I5033 Acute on chronic diastolic (congestive) heart failure: Secondary | ICD-10-CM | POA: Diagnosis not present

## 2016-03-30 DIAGNOSIS — I272 Pulmonary hypertension, unspecified: Secondary | ICD-10-CM | POA: Diagnosis not present

## 2016-03-30 DIAGNOSIS — I5031 Acute diastolic (congestive) heart failure: Secondary | ICD-10-CM | POA: Diagnosis not present

## 2016-03-30 DIAGNOSIS — I2510 Atherosclerotic heart disease of native coronary artery without angina pectoris: Secondary | ICD-10-CM | POA: Diagnosis not present

## 2016-03-30 DIAGNOSIS — I252 Old myocardial infarction: Secondary | ICD-10-CM | POA: Diagnosis not present

## 2016-03-30 DIAGNOSIS — I4891 Unspecified atrial fibrillation: Secondary | ICD-10-CM | POA: Diagnosis not present

## 2016-03-30 DIAGNOSIS — E119 Type 2 diabetes mellitus without complications: Secondary | ICD-10-CM | POA: Diagnosis not present

## 2016-03-30 DIAGNOSIS — I5032 Chronic diastolic (congestive) heart failure: Secondary | ICD-10-CM | POA: Diagnosis not present

## 2016-03-30 DIAGNOSIS — I481 Persistent atrial fibrillation: Secondary | ICD-10-CM | POA: Diagnosis not present

## 2016-03-30 DIAGNOSIS — J9601 Acute respiratory failure with hypoxia: Secondary | ICD-10-CM | POA: Diagnosis not present

## 2016-03-30 DIAGNOSIS — J9600 Acute respiratory failure, unspecified whether with hypoxia or hypercapnia: Secondary | ICD-10-CM | POA: Diagnosis not present

## 2016-03-30 LAB — BASIC METABOLIC PANEL
Anion gap: 9 (ref 5–15)
BUN: 23 mg/dL — ABNORMAL HIGH (ref 6–20)
CO2: 29 mmol/L (ref 22–32)
Calcium: 9.1 mg/dL (ref 8.9–10.3)
Chloride: 97 mmol/L — ABNORMAL LOW (ref 101–111)
Creatinine, Ser: 0.95 mg/dL (ref 0.44–1.00)
GFR calc Af Amer: >60 mL/min (ref >60)
GFR calc non Af Amer: 59 mL/min — ABNORMAL LOW (ref >60)
Glucose, Bld: 139 mg/dL — ABNORMAL HIGH (ref 65–99)
Potassium: 4.2 mmol/L (ref 3.5–5.1)
Sodium: 135 mmol/L (ref 135–145)

## 2016-03-30 LAB — GLUCOSE, CAPILLARY
Glucose-Capillary: 147 mg/dL — ABNORMAL HIGH (ref 65–99)
Glucose-Capillary: 129 mg/dL — ABNORMAL HIGH (ref 65–99)
Glucose-Capillary: 164 mg/dL — ABNORMAL HIGH (ref 65–99)
Glucose-Capillary: 138 mg/dL — ABNORMAL HIGH (ref 65–99)
Glucose-Capillary: 140 mg/dL — ABNORMAL HIGH (ref 65–99)

## 2016-03-30 NOTE — Progress Notes (Signed)
RT NOTE:  Pt has home CPAP @ bedside. She can manage the machine, will call if RT needed.

## 2016-03-30 NOTE — Progress Notes (Signed)
PROGRESS NOTE    [name]  QTM:[id number] DOB: January 19, 1946 DOA: 03/26/2016 PCP: Lottie Dawson, MD   Brief Narrative:  70 y.o. WF PMHx TIA, CVA, Anxiety,Depression, HTN, Diastolic CHF, A.Fib with RVR control with Tykosin (on Pradaxa), pacemaker.   MI, HLD, CKD, OSA on CPAP   Patient presents to the ED with SOB.  Symptoms onset Friday.  Symptoms progressively worsening since onset.  Worse with activity or laying down, better when sitting upright.  No associated weight gain or swelling more than baseline she says.   Subjective: 9/28 A/O 4, NAD sitting in chair comfortably. States will usually have A. fib when she gets sick but will spontaneously revert to NSR after her illness resolved. States unfortunately this time has not converted to NSR.     Assessment & Plan:   Principal Problem:   Acute diastolic heart failure (HCC) Active Problems:   Chronic diastolic heart failure- grade 2 diastolic dysfunction by echo 10/03/13   Atrial fibrillation with RVR (HCC)   Acute respiratory failure with hypoxia (HCC)   Chronic atrial fibrillation (HCC)   HLD (hyperlipidemia)   Controlled type 2 diabetes mellitus without complication (HCC)   Acute hypoxic respiratory failure due to acute exacerbation of diastolic CHF -Strict in and out since admission -4.5 L -Daily weight Filed Weights   03/28/16 0259 03/29/16 0400 03/30/16 0405  Weight: 120.5 kg (265 lb 10.5 oz) 120 kg (264 lb 8 oz) 118.9 kg (262 lb 1.6 oz)  -CHF team on board  FUO - suspected acute bronchitis  -Afebrile overnight -Chest x-ray without focal infiltrate but the patient does have right basilar crackles - UA unrevealing  - continue empiric antibiotics: Given patient's core morbidities will complete a five-day course antibiotics.   Chronic A. fib with RVR(CHA2DS2 - VASc is 6) - Cardiology plans on DCCV 9/29   HTN -Within AHA guidelines   Morbid obesity - Body mass index is 41.43 kg/m.  OSA -CPAP per  respiratory  Hyperlipidemia -Lipitor 40 mg daily -Lipid panel pending  DM Type 2 controlled with out, complications -6/25 Hemoglobin A1c= 6.7  -Newly diagnosed in June 2017  - Sensitive SSI   DVT prophylaxis: Pradaxa Code Status: Full Family Communication: None Disposition Plan: Per cardiology. DC CV 9/29   Consultants:  [facility] + EP    Procedures/Significant Events:  None  Cultures 9/24 MRSA by PCR negative 9/25 blood right/left hand NGTD 9/26 urine negative    Antimicrobials: Ceftriaxone 9/25 > Doxycycline 9/26 > Levaquin 9/25 1 dose   Devices    LINES / TUBES:      Continuous Infusions:    Objective: Vitals:   03/30/16 1149 03/30/16 1200 03/30/16 1658 03/30/16 1700  BP: 114/68  109/69 109/69  Pulse: 91 93 (!) 59 98  Resp:   18   Temp: 98.4 F (36.9 C)  98 F (36.7 C)   TempSrc: Oral  Oral   SpO2: 93% 94% 96% 95%  Weight:      Height:        Intake/Output Summary (Last 24 hours) at 03/30/16 2015 Last data filed at 03/30/16 1800  Gross per 24 hour  Intake              400 ml  Output             1550 ml  Net            -1150 ml   Filed Weights   03/28/16 0259 03/29/16 0400 03/30/16 0405  Weight: 120.5 kg (265 lb 10.5 oz) 120 kg (264 lb 8 oz) 118.9 kg (262 lb 1.6 oz)    Examination:  General: A/O 4, NAD, No acute respiratory distress Eyes: negative scleral hemorrhage, negative anisocoria, negative icterus ENT: Negative Runny nose, negative gingival bleeding, Neck:  Negative scars, masses, torticollis, lymphadenopathy, JVD Lungs: Clear to auscultation bilaterally without wheezes or crackles Cardiovascular: Irregular irregular rhythm and rate, without murmur gallop or rub normal S1 and S2 Abdomen: Morbidly obese, negative abdominal pain, nondistended, positive soft, bowel sounds, no rebound, no ascites, no appreciable mass Extremities: No significant cyanosis, clubbing, or edema bilateral lower extremities Skin: Negative  rashes, lesions, ulcers Psychiatric:  Negative depression, negative anxiety, negative fatigue, negative mania  Central nervous system:  Cranial nerves II through XII intact, tongue/uvula midline, all extremities muscle strength 5/5, sensation intact throughout, negative expressive aphasia, negative receptive aphasia.  .     Data Reviewed: Care during the described time interval was provided by me .  I have reviewed this patient's available data, including medical history, events of note, physical examination, and all test results as part of my evaluation. I have personally reviewed and interpreted all radiology studies.  CBC:  Recent Labs Lab 03/26/16 1630 03/26/16 1642 03/28/16 0250  WBC 11.8*  --  11.7*  NEUTROABS 7.9*  --   --   HGB 12.8 13.9 13.1  HCT 39.3 41.0 40.3  MCV 92.9  --  92.9  PLT 248  --  614   Basic Metabolic Panel:  Recent Labs Lab 03/26/16 1630 03/26/16 1642 03/26/16 2009 03/27/16 0202 03/28/16 0250 03/29/16 0227 03/30/16 0246  NA 133* 135  --  136 136 137 135  K 3.9 4.0  --  4.0 4.1 4.5 4.2  CL 97* 97*  --  100* 97* 99* 97*  CO2 26  --   --  27 27 30 29   GLUCOSE 139* 135*  --  133* 141* 137* 139*  BUN 15 18  --  14 15 16  23*  CREATININE 1.01* 0.90  --  0.97 0.89 1.01* 0.95  CALCIUM 9.1  --   --  8.7* 9.0 8.9 9.1  MG  --   --  2.0  --  2.0  --   --    GFR: Estimated Creatinine Clearance: 73.5 mL/min (by C-G formula based on SCr of 0.95 mg/dL). Liver Function Tests: No results for input(s): AST, ALT, ALKPHOS, BILITOT, PROT, ALBUMIN in the last 168 hours. No results for input(s): LIPASE, AMYLASE in the last 168 hours. No results for input(s): AMMONIA in the last 168 hours. Coagulation Profile: No results for input(s): INR, PROTIME in the last 168 hours. Cardiac Enzymes:  Recent Labs Lab 03/26/16 2009 03/27/16 0202 03/27/16 0754  TROPONINI 0.04* <0.03 <0.03   BNP (last 3 results) No results for input(s): PROBNP in the last 8760  hours. HbA1C: No results for input(s): HGBA1C in the last 72 hours. CBG:  Recent Labs Lab 03/29/16 2209 03/30/16 0824 03/30/16 1147 03/30/16 1655 03/30/16 2000  GLUCAP 161* 147* 129* 164* 138*   Lipid Profile: No results for input(s): CHOL, HDL, LDLCALC, TRIG, CHOLHDL, LDLDIRECT in the last 72 hours. Thyroid Function Tests: No results for input(s): TSH, T4TOTAL, FREET4, T3FREE, THYROIDAB in the last 72 hours. Anemia Panel: No results for input(s): VITAMINB12, FOLATE, FERRITIN, TIBC, IRON, RETICCTPCT in the last 72 hours. Urine analysis:    Component Value Date/Time   COLORURINE YELLOW 03/28/2016 [place] 03/28/2016 1050  LABSPEC 1.015 03/28/2016 1050   PHURINE 6.5 03/28/2016 1050   GLUCOSEU NEGATIVE 03/28/2016 1050   HGBUR NEGATIVE 03/28/2016 1050   HGBUR negative 06/01/2010 0841   BILIRUBINUR NEGATIVE 03/28/2016 1050   BILIRUBINUR n 11/04/2012 1057   KETONESUR NEGATIVE 03/28/2016 1050   PROTEINUR NEGATIVE 03/28/2016 1050   UROBILINOGEN 0.2 11/04/2012 1057   UROBILINOGEN 0.2 06/01/2010 0841   NITRITE NEGATIVE 03/28/2016 1050   LEUKOCYTESUR NEGATIVE 03/28/2016 1050   Sepsis Labs: @LABRCNTIP (procalcitonin:4,lacticidven:4)  ) Recent Results (from the past 240 hour(s))  MRSA PCR Screening     Status: None   Collection Time: 03/26/16  9:40 PM  Result Value Ref Range Status   MRSA by PCR NEGATIVE NEGATIVE Final    Comment:        The GeneXpert MRSA Assay (FDA approved for NASAL specimens only), is one component of a comprehensive MRSA colonization surveillance program. It is not intended to diagnose MRSA infection nor to guide or monitor treatment for MRSA infections.   Culture, blood (routine x 2)     Status: None (Preliminary result)   Collection Time: 03/27/16  3:40 PM  Result Value Ref Range Status   Specimen Description BLOOD RIGHT HAND  Final   Special Requests IN PEDIATRIC BOTTLE 2CC  Final   Culture NO GROWTH 3 DAYS  Final   Report  Status PENDING  Incomplete  Culture, blood (routine x 2)     Status: None (Preliminary result)   Collection Time: 03/27/16  3:50 PM  Result Value Ref Range Status   Specimen Description BLOOD LEFT HAND  Final   Special Requests IN PEDIATRIC BOTTLE .[place]  Final   Culture NO GROWTH 3 DAYS  Final   Report Status PENDING  Incomplete  Urine culture     Status: None   Collection Time: 03/28/16 10:50 AM  Result Value Ref Range Status   Specimen Description URINE, RANDOM  Final   Special Requests NONE  Final   Culture NO GROWTH  Final   Report Status 03/29/2016 FINAL  Final         Radiology Studies: No results found.      Scheduled Meds: . aspirin EC  81 mg Oral QHS  . atorvastatin  40 mg Oral Daily  . cefTRIAXone (ROCEPHIN)  IV  1 g Intravenous Q24H  . dabigatran  150 mg Oral Q12H  . diltiazem  180 mg Oral Daily  . dofetilide  250 mcg Oral BID  . doxycycline  100 mg Oral Q12H  . FLUoxetine  20 mg Oral q morning - 10a  . furosemide  60 mg Intravenous BID  . insulin aspart  0-5 Units Subcutaneous QHS  . insulin aspart  0-9 Units Subcutaneous TID WC  . levalbuterol  0.63 mg Nebulization BID  . losartan  50 mg Oral Daily  . mouth rinse  15 mL Mouth Rinse BID  . metoprolol succinate  100 mg Oral BID  . multivitamin with minerals  1 tablet Oral Daily  . potassium chloride SA  20 mEq Oral BID   Continuous Infusions:    LOS: 4 days    Time spent: 40 minutes    Akayla Brass, Geraldo Docker, MD [facility] Pager [phone]   If 7PM-7AM, please contact night-coverage [email or web address] Password [facility] 03/30/2016, 8:15 PM

## 2016-03-30 NOTE — Progress Notes (Signed)
PROGRESS NOTE  70 y/o female with coronary artery disease, chronic diastolic dysfunction, stroke, paroxysmal atrial fibrillation on Tikosyn and Pradaxa, symptomatic tachycardia bradycardia syndrome, status post permanent pacemaker insertion and OSA compliant with CPAP, admitted for hypoxic respiratory failure, in the setting of a/c CHF and respiratory infection. ? Flu vs PNA.     Subjective:   [name] is doing ok. Still in aFib with RVR  Appreciated Dr. Jackalyn Lombard assistance  He would not recommend increasing Tikosyn at this point since she is on Levaquin.  On for  DC cardioversion tomorrow with Dr. Lovena Le      Objective:    Vital Signs:   Temp:  [97.5 F (36.4 C)-98.7 F (37.1 C)] 98.2 F (36.8 C) (09/28 0826) Pulse Rate:  [94-129] 129 (09/28 0826) Resp:  [16-20] 18 (09/28 0826) BP: (96-132)/(61-81) 96/71 (09/28 0826) SpO2:  [91 %-97 %] 95 % (09/28 0826) Weight:  [262 lb 1.6 oz (118.9 kg)] 262 lb 1.6 oz (118.9 kg) (09/28 0405)  Last BM Date: 03/29/16   24-hour weight change: Weight change: -2 lb 6.4 oz (-1.089 kg)  Weight trends: Filed Weights   03/28/16 0259 03/29/16 0400 03/30/16 0405  Weight: 265 lb 10.5 oz (120.5 kg) 264 lb 8 oz (120 kg) 262 lb 1.6 oz (118.9 kg)    Intake/Output:  09/27 0701 - 09/28 0700 In: 6270 [P.O.:1090; IV Piggyback:100] Out: 2850 [Urine:2850] No intake/output data recorded.   Physical Exam: BP 96/71 (BP Location: Left Wrist)   Pulse (!) 129   Temp 98.2 F (36.8 C) (Oral)   Resp 18   Ht 5\' 7"  (1.702 m) Comment: stated.   Wt 262 lb 1.6 oz (118.9 kg)   SpO2 95%   BMI 41.05 kg/m   Wt Readings from Last 3 Encounters:  03/30/16 262 lb 1.6 oz (118.9 kg)  01/28/16 271 lb 12.8 oz (123.3 kg)  12/23/15 272 lb 4.8 oz (123.5 kg)    General: Vital signs reviewed and noted.   Head: Normocephalic, atraumatic.  Eyes: conjunctivae/corneas clear.  EOM's intact.   Throat: normal  Neck:  normal   Lungs:    coarse rales - right base     Heart:  jirreg. Irreg. Tachycardia   Abdomen:  Soft, non-tender, non-distended    Extremities: Left calf is larger than the left calf.    Neurologic: A&O X3, CN II - XII are grossly intact.   Psych: Normal     Labs: BMET:  Recent Labs  03/28/16 0250 03/29/16 0227 03/30/16 0246  NA 136 137 135  K 4.1 4.5 4.2  CL 97* 99* 97*  CO2 27 30 29   GLUCOSE 141* 137* 139*  BUN 15 16 23*  CREATININE 0.89 1.01* 0.95  CALCIUM 9.0 8.9 9.1  MG 2.0  --   --     Liver function tests: No results for input(s): AST, ALT, ALKPHOS, BILITOT, PROT, ALBUMIN in the last 72 hours. No results for input(s): LIPASE, AMYLASE in the last 72 hours.  CBC:  Recent Labs  03/28/16 0250  WBC 11.7*  HGB 13.1  HCT 40.3  MCV 92.9  PLT 234    Cardiac Enzymes: No results for input(s): CKTOTAL, CKMB, TROPONINI in the last 72 hours.  Coagulation Studies: No results for input(s): LABPROT, INR in the last 72 hours.  Other: Invalid input(s): POCBNP No results for input(s): DDIMER in the last 72 hours. No results for input(s): HGBA1C in the last 72 hours. No results for input(s): CHOL, HDL,  LDLCALC, TRIG, CHOLHDL in the last 72 hours. No results for input(s): TSH, T4TOTAL, T3FREE, THYROIDAB in the last 72 hours.  Invalid input(s): FREET3 No results for input(s): VITAMINB12, FOLATE, FERRITIN, TIBC, IRON, RETICCTPCT in the last 72 hours.   Other results:  EKG  ( personally reviewed )  - - A-fib with V rate of 108.   Medications:    Infusions:    Scheduled Medications: . aspirin EC  81 mg Oral QHS  . atorvastatin  40 mg Oral Daily  . cefTRIAXone (ROCEPHIN)  IV  1 g Intravenous Q24H  . dabigatran  150 mg Oral Q12H  . diltiazem  180 mg Oral Daily  . dofetilide  250 mcg Oral BID  . doxycycline  100 mg Oral Q12H  . FLUoxetine  20 mg Oral q morning - 10a  . furosemide  60 mg Intravenous BID  . insulin aspart  0-5 Units Subcutaneous QHS  . insulin aspart  0-9 Units Subcutaneous TID WC  .  levalbuterol  0.63 mg Nebulization BID  . losartan  50 mg Oral Daily  . mouth rinse  15 mL Mouth Rinse BID  . metoprolol succinate  100 mg Oral BID  . multivitamin with minerals  1 tablet Oral Daily  . potassium chloride SA  20 mEq Oral BID    Assessment/ Plan:   Principal Problem:   Acute diastolic heart failure (HCC) Active Problems:   Chronic diastolic heart failure- grade 2 diastolic dysfunction by echo 10/03/13   Atrial fibrillation with RVR (HCC)   Acute respiratory failure with hypoxia ([place])  1. Atrial fib:    Was seen by EP yesterday   will continue Tikosyn at the present dose - will avoid increaing while she is on Levaquin Scheduled for DC cardioversion Friday with Dr. Lovena Le    2. Pneumonia - ? Viral pneumonia  Has significant rales in the right base. Breathing is better.   Doing well with the nebs   Disposition:  Length of Stay: 4  Thayer Headings, Brooke Bonito., MD, [facility] 03/30/2016, 8:36 AM Office [phone] Pager [phone]

## 2016-03-31 ENCOUNTER — Encounter (HOSPITAL_COMMUNITY)
Admission: EM | Disposition: A | Discharge status: Home / Self Care | Payer: Self-pay | Source: Home / Self Care | Attending: Internal Medicine

## 2016-03-31 ENCOUNTER — Encounter (HOSPITAL_COMMUNITY): Payer: Self-pay | Admitting: Internal Medicine

## 2016-03-31 DIAGNOSIS — I481 Persistent atrial fibrillation: Secondary | ICD-10-CM | POA: Diagnosis not present

## 2016-03-31 DIAGNOSIS — Z6841 Body Mass Index (BMI) 40.0 and over, adult: Secondary | ICD-10-CM | POA: Diagnosis not present

## 2016-03-31 DIAGNOSIS — I272 Pulmonary hypertension, unspecified: Secondary | ICD-10-CM | POA: Diagnosis not present

## 2016-03-31 DIAGNOSIS — I2510 Atherosclerotic heart disease of native coronary artery without angina pectoris: Secondary | ICD-10-CM | POA: Diagnosis not present

## 2016-03-31 DIAGNOSIS — I482 Chronic atrial fibrillation: Secondary | ICD-10-CM | POA: Diagnosis not present

## 2016-03-31 DIAGNOSIS — Z7901 Long term (current) use of anticoagulants: Secondary | ICD-10-CM | POA: Diagnosis not present

## 2016-03-31 DIAGNOSIS — I4891 Unspecified atrial fibrillation: Secondary | ICD-10-CM | POA: Diagnosis not present

## 2016-03-31 DIAGNOSIS — I5033 Acute on chronic diastolic (congestive) heart failure: Secondary | ICD-10-CM | POA: Diagnosis not present

## 2016-03-31 DIAGNOSIS — I5031 Acute diastolic (congestive) heart failure: Secondary | ICD-10-CM

## 2016-03-31 DIAGNOSIS — J9601 Acute respiratory failure with hypoxia: Secondary | ICD-10-CM | POA: Diagnosis not present

## 2016-03-31 DIAGNOSIS — I252 Old myocardial infarction: Secondary | ICD-10-CM | POA: Diagnosis not present

## 2016-03-31 DIAGNOSIS — E119 Type 2 diabetes mellitus without complications: Secondary | ICD-10-CM | POA: Diagnosis not present

## 2016-03-31 DIAGNOSIS — I110 Hypertensive heart disease with heart failure: Secondary | ICD-10-CM | POA: Diagnosis not present

## 2016-03-31 HISTORY — PX: ELECTROPHYSIOLOGIC STUDY: SHX172A

## 2016-03-31 LAB — LIPID PANEL
Cholesterol: 100 mg/dL (ref 0–200)
HDL: 22 mg/dL — ABNORMAL LOW (ref >40)
LDL Cholesterol: 49 mg/dL (ref 0–99)
Total CHOL/HDL Ratio: 4.5 RATIO
Triglycerides: 143 mg/dL (ref <150)
VLDL: 29 mg/dL (ref 0–40)

## 2016-03-31 LAB — BASIC METABOLIC PANEL
Anion gap: 11 (ref 5–15)
BUN: 28 mg/dL — ABNORMAL HIGH (ref 6–20)
CO2: 26 mmol/L (ref 22–32)
Calcium: 9.3 mg/dL (ref 8.9–10.3)
Chloride: 99 mmol/L — ABNORMAL LOW (ref 101–111)
Creatinine, Ser: 0.99 mg/dL (ref 0.44–1.00)
GFR calc Af Amer: >60 mL/min (ref >60)
GFR calc non Af Amer: 56 mL/min — ABNORMAL LOW (ref >60)
Glucose, Bld: 171 mg/dL — ABNORMAL HIGH (ref 65–99)
Potassium: 4 mmol/L (ref 3.5–5.1)
Sodium: 136 mmol/L (ref 135–145)

## 2016-03-31 LAB — MAGNESIUM: Magnesium: 2 mg/dL (ref 1.7–2.4)

## 2016-03-31 LAB — GLUCOSE, CAPILLARY
Glucose-Capillary: 133 mg/dL — ABNORMAL HIGH (ref 65–99)
Glucose-Capillary: 130 mg/dL — ABNORMAL HIGH (ref 65–99)
Glucose-Capillary: 118 mg/dL — ABNORMAL HIGH (ref 65–99)
Glucose-Capillary: 119 mg/dL — ABNORMAL HIGH (ref 65–99)
Glucose-Capillary: 124 mg/dL — ABNORMAL HIGH (ref 65–99)

## 2016-03-31 LAB — CBC
HCT: 41.9 % (ref 36.0–46.0)
Hemoglobin: 13.5 g/dL (ref 12.0–15.0)
MCH: 29.7 pg (ref 26.0–34.0)
MCHC: 32.2 g/dL (ref 30.0–36.0)
MCV: 92.1 fL (ref 78.0–100.0)
Platelets: 354 10*3/uL (ref 150–400)
RBC: 4.55 MIL/uL (ref 3.87–5.11)
RDW: 15.8 % — ABNORMAL HIGH (ref 11.5–15.5)
WBC: 9.8 10*3/uL (ref 4.0–10.5)

## 2016-03-31 SURGERY — CARDIOVERSION (CATH LAB)

## 2016-03-31 MED ORDER — MIDAZOLAM HCL 2 MG/2ML IJ SOLN
INTRAMUSCULAR | Status: AC
  Filled 2016-03-31: qty 2

## 2016-03-31 MED ORDER — MIDAZOLAM HCL 2 MG/2ML IJ SOLN
INTRAMUSCULAR | Status: DC | PRN
  Administered 2016-03-31 (×5): 2 mg via INTRAVENOUS

## 2016-03-31 MED ORDER — SODIUM CHLORIDE 0.9% FLUSH: 3.0000 mL | INTRAVENOUS | Status: DC | PRN

## 2016-03-31 MED ORDER — ONDANSETRON HCL 4 MG/2ML IJ SOLN: 4.0000 mg | Freq: Four times a day (QID) | INTRAMUSCULAR | Status: DC | PRN

## 2016-03-31 MED ORDER — BENZONATATE 100 MG PO CAPS: 100.0000 mg | ORAL_CAPSULE | Freq: Three times a day (TID) | ORAL | Status: DC | PRN

## 2016-03-31 MED ORDER — ACETAMINOPHEN 325 MG PO TABS: 650.0000 mg | ORAL_TABLET | ORAL | Status: DC | PRN

## 2016-03-31 MED ORDER — FENTANYL CITRATE (PF) 100 MCG/2ML IJ SOLN
INTRAMUSCULAR | Status: AC
  Filled 2016-03-31: qty 2

## 2016-03-31 MED ORDER — FENTANYL CITRATE (PF) 100 MCG/2ML IJ SOLN
INTRAMUSCULAR | Status: DC | PRN
  Administered 2016-03-31 (×4): 25 ug via INTRAVENOUS

## 2016-03-31 MED ORDER — SODIUM CHLORIDE 0.9 % IV SOLN: 250.0000 mL | INTRAVENOUS | Status: DC | PRN

## 2016-03-31 MED ORDER — SODIUM CHLORIDE 0.9% FLUSH: 3.0000 mL | Freq: Two times a day (BID) | INTRAVENOUS | Status: DC

## 2016-03-31 SURGICAL SUPPLY — 1 items: ELECT DEFIB PAD ADLT CADENCE (PAD) ×2 IMPLANT

## 2016-03-31 NOTE — Progress Notes (Signed)
[facility] TEAM 1 - Stepdown/ICU TEAM  [name]  WFU:[id number] DOB: 1946/01/10 DOA: 03/26/2016 PCP: Lottie Dawson, MD    Brief Narrative:  70 y.o.femalewith history of CHF, A.Fib on Tykosin, and pacemaker who presented to the ED with SOB which was worse with activity or laying down, better when sitting upright. She was placed on BiPAP and admitted to stepdown for a CHF exacerbation.  On the second day of admission she spiked a temperature of 101 raising the suspicion of possible underlying pneumonia.  Subjective: Feeling better today.  No chest pain.  Less sob.  No n/v or abdom pain.  Confirms she does not wear O2 at home.    Assessment & Plan:  Acute hypoxic respiratory failure due to acute exacerbation of diastolic CHF Net negative approximately 5.3 L thus far - continues to require modestly high level of O2 support  Filed Weights   03/30/16 0405 03/31/16 0418 03/31/16 0520  Weight: 118.9 kg (262 lb 1.6 oz) 118.5 kg (261 lb 3.9 oz) 118.1 kg (260 lb 5.8 oz)   FUO - suspected acute bronchitis  Chest x-ray without focal infiltrate but the patient did have right basilar crackles - UA unrevealing - WBC normalized -  empiric antibiotics to cont for now  Chronic A. fib with RVR CHA2DS2 - VASc is at least 5 - s/p DCCV 9/29 and presently in NSR  HTN Blood pressure currently controlled  Morbid obesity - Body mass index is 40.78 kg/m.  OSA  Hyperlipidemia Continue home medical therapy  DM2 Newly diagnosed in June 2017 - CBGs reasonably controlled   DVT prophylaxis: Pradaxa Code Status: FULL CODE Family Communication: spoke w/ husband at bedside  Disposition Plan: SDU over night - probable transfer to tele in AM  Consultants:  [facility] + EP  Procedures: none  Antimicrobials:  Ceftriaxone 9/25 > Doxycycline 9/26 > Levaquin 9/25  Objective: Blood pressure 103/68, pulse (!) 0, temperature 97.5 F (36.4 C), temperature source Oral, resp. rate (!) 0,  height 5\' 7"  (1.702 m), weight 118.1 kg (260 lb 5.8 oz), SpO2 (!) 0 %.  Intake/Output Summary (Last 24 hours) at 03/31/16 1504 Last data filed at 03/31/16 0521  Gross per 24 hour  Intake              490 ml  Output             1300 ml  Net             -810 ml   Filed Weights   03/30/16 0405 03/31/16 0418 03/31/16 0520  Weight: 118.9 kg (262 lb 1.6 oz) 118.5 kg (261 lb 3.9 oz) 118.1 kg (260 lb 5.8 oz)    Examination: General: No acute respiratory distress at rest in bed  Lungs: Right basilar crackles persist - good air movement throughout other fields without wheeze Cardiovascular: RRR at present w/o M or gallup  Abdomen: Nontender, nondistended, soft, bowel sounds positive, no rebound, no ascites, no appreciable mass Extremities: No significant cyanosis, clubbing - trace edema bilateral lower extremities  CBC:  Recent Labs Lab 03/26/16 1630 03/26/16 1642 03/28/16 0250 03/31/16 0248  WBC 11.8*  --  11.7* 9.8  NEUTROABS 7.9*  --   --   --   HGB 12.8 13.9 13.1 13.5  HCT 39.3 41.0 40.3 41.9  MCV 92.9  --  92.9 92.1  PLT 248  --  234 202   Basic Metabolic Panel:  Recent Labs Lab 03/26/16 2009 03/27/16 0202 03/28/16 0250  03/29/16 0227 03/30/16 0246 03/31/16 0248  NA  --  136 136 137 135 136  K  --  4.0 4.1 4.5 4.2 4.0  CL  --  100* 97* 99* 97* 99*  CO2  --  27 27 30 29 26   GLUCOSE  --  133* 141* 137* 139* 171*  BUN  --  14 15 16  23* 28*  CREATININE  --  0.97 0.89 1.01* 0.95 0.99  CALCIUM  --  8.7* 9.0 8.9 9.1 9.3  MG 2.0  --  2.0  --   --  2.0   GFR: Estimated Creatinine Clearance: 70.3 mL/min (by C-G formula based on SCr of 0.99 mg/dL).  Cardiac Enzymes:  Recent Labs Lab 03/26/16 2009 03/27/16 0202 03/27/16 0754  TROPONINI 0.04* <0.03 <0.03    HbA1C: Hgb A1c MFr Bld  Date/Time Value Ref Range Status  12/20/2015 09:28 AM 6.7 (H) 4.6 - 6.5 % Final    Comment:    Glycemic Control Guidelines for People with Diabetes:Non Diabetic:  <6%Goal of Therapy:  <7%Additional Action Suggested:  >8%   06/04/2015 08:57 AM 6.5 4.6 - 6.5 % Final    Comment:    Glycemic Control Guidelines for People with Diabetes:Non Diabetic:  <6%Goal of Therapy: <7%Additional Action Suggested:  >8%     CBG:  Recent Labs Lab 03/30/16 2000 03/30/16 2139 03/31/16 0417 03/31/16 0806 03/31/16 1138  GLUCAP 138* 140* 133* 130* 118*    Recent Results (from the past 240 hour(s))  MRSA PCR Screening     Status: None   Collection Time: 03/26/16  9:40 PM  Result Value Ref Range Status   MRSA by PCR NEGATIVE NEGATIVE Final    Comment:        The GeneXpert MRSA Assay (FDA approved for NASAL specimens only), is one component of a comprehensive MRSA colonization surveillance program. It is not intended to diagnose MRSA infection nor to guide or monitor treatment for MRSA infections.   Culture, blood (routine x 2)     Status: None (Preliminary result)   Collection Time: 03/27/16  3:40 PM  Result Value Ref Range Status   Specimen Description BLOOD RIGHT HAND  Final   Special Requests IN PEDIATRIC BOTTLE 2CC  Final   Culture NO GROWTH 3 DAYS  Final   Report Status PENDING  Incomplete  Culture, blood (routine x 2)     Status: None (Preliminary result)   Collection Time: 03/27/16  3:50 PM  Result Value Ref Range Status   Specimen Description BLOOD LEFT HAND  Final   Special Requests IN PEDIATRIC BOTTLE .[place]  Final   Culture NO GROWTH 3 DAYS  Final   Report Status PENDING  Incomplete  Urine culture     Status: None   Collection Time: 03/28/16 10:50 AM  Result Value Ref Range Status   Specimen Description URINE, RANDOM  Final   Special Requests NONE  Final   Culture NO GROWTH  Final   Report Status 03/29/2016 FINAL  Final     Scheduled Meds: . aspirin EC  81 mg Oral QHS  . atorvastatin  40 mg Oral Daily  . cefTRIAXone (ROCEPHIN)  IV  1 g Intravenous Q24H  . dabigatran  150 mg Oral Q12H  . diltiazem  180 mg Oral Daily  . dofetilide  250 mcg Oral BID  .  doxycycline  100 mg Oral Q12H  . FLUoxetine  20 mg Oral q morning - 10a  . furosemide  60 mg Intravenous BID  .  insulin aspart  0-5 Units Subcutaneous QHS  . insulin aspart  0-9 Units Subcutaneous TID WC  . levalbuterol  0.63 mg Nebulization BID  . losartan  50 mg Oral Daily  . mouth rinse  15 mL Mouth Rinse BID  . metoprolol succinate  100 mg Oral BID  . multivitamin with minerals  1 tablet Oral Daily  . potassium chloride SA  20 mEq Oral BID  . sodium chloride flush  3 mL Intravenous Q12H    LOS: 5 days   Cherene Altes, MD [facility] Office  [phone] Pager - Text Page per [name] as per below:  On-Call/Text Page:      [name]      password [email or web address]  If 7PM-7AM, please contact night-coverage [email or web address] Password [facility] 03/31/2016, 3:04 PM

## 2016-03-31 NOTE — H&P (View-Only) (Signed)
SUBJECTIVE: The patient is doing well today.  At this time, she denies chest pain, c/w a cough, no shortness of breath, no new concerns.  [name] aspirin EC  81 mg Oral QHS  . atorvastatin  40 mg Oral Daily  . cefTRIAXone (ROCEPHIN)  IV  1 g Intravenous Q24H  . dabigatran  150 mg Oral Q12H  . diltiazem  180 mg Oral Daily  . dofetilide  250 mcg Oral BID  . doxycycline  100 mg Oral Q12H  . FLUoxetine  20 mg Oral q morning - 10a  . furosemide  60 mg Intravenous BID  . insulin aspart  0-5 Units Subcutaneous QHS  . insulin aspart  0-9 Units Subcutaneous TID WC  . levalbuterol  0.63 mg Nebulization BID  . losartan  50 mg Oral Daily  . mouth rinse  15 mL Mouth Rinse BID  . metoprolol succinate  100 mg Oral BID  . multivitamin with minerals  1 tablet Oral Daily  . potassium chloride SA  20 mEq Oral BID      OBJECTIVE: Physical Exam: Vitals:   03/31/16 0807 03/31/16 0852 03/31/16 0853 03/31/16 0915  BP: (!) 133/53 (!) 133/53 (!) 133/53   Pulse: 99 (!) 110    Resp:      Temp: 98.3 F (36.8 C)     TempSrc: Oral     SpO2: 92%   92%  Weight:      Height:        Intake/Output Summary (Last 24 hours) at 03/31/16 1007 Last data filed at 03/31/16 0521  Gross per 24 hour  Intake              540 ml  Output             1900 ml  Net            -1360 ml    Telemetry reveals AFib, 90's-110  GEN- The patient is well appearing, in NAD alert and oriented x 3 today.   Head- normocephalic, atraumatic Eyes-  Sclera clear, conjunctiva pink Ears- hearing intact Oropharynx- clear Neck- supple, no JVP Lungs- rales at the bases normal work of breathing Heart- IRRR, tachycardic, no significant murmurs, no rubs or gallops GI- soft, NT, ND Extremities- no clubbing, cyanosis, or edema Skin- no rash or lesion Psych- euthymic mood, full affect Neuro- no gross deficits appreciated  LABS: Basic Metabolic Panel:  Recent Labs  03/30/16 0246 03/31/16 0248  NA 135 136  K 4.2 4.0  CL 97* 99*   CO2 29 26  GLUCOSE 139* 171*  BUN 23* 28*  CREATININE 0.95 0.99  CALCIUM 9.1 9.3  MG  --  2.0    CBC:  Recent Labs  03/31/16 0248  WBC 9.8  HGB 13.5  HCT 41.9  MCV 92.1  PLT 354   Fasting Lipid Panel:  Recent Labs  03/31/16 0248  CHOL 100  HDL 22*  LDLCALC 49  TRIG 143  CHOLHDL 4.5      ASSESSMENT AND PLAN:   1. Persistent AFib, hx of tachy/brady w/ PPM      CHA2DS2Vasc is at least 5 on Pradaxa     The patient reports taking her paradaxa out patient without fail BID, assures no missed dose, she has gotten in-patient as well, planned for DCCV this afternoon.       Discussed DCCV procedure, risks/benefits, she wants to proceed      On Tikosyn chronically and maintaining SR well until  resp insuff.      K+ 4.0, Mag 2.0, Creat 0.99, QTc stable by last EKG      Avoid QT prolonging meds  2. Respiratory insufficiency     Continues to improve     pneumonia +/-  fluid OL     Cumulatively fluid negative -5305,labs stable    Tommye Standard, PA-C 03/31/2016 10:07 AM  Ep Attending  Patient seen and examined. Agree with above. She is stable for DCCV. I have discussed the indications, risk/benefit/goals/expectations and she wishes to proceed.  Mikle Bosworth.D.

## 2016-03-31 NOTE — Progress Notes (Signed)
PROGRESS NOTE  70 y/o female with coronary artery disease, chronic diastolic dysfunction, stroke, paroxysmal atrial fibrillation on Tikosyn and Pradaxa, symptomatic tachycardia bradycardia syndrome, status post permanent pacemaker insertion and OSA compliant with CPAP, admitted for hypoxic respiratory failure, in the setting of a/c CHF and respiratory infection. ? Flu vs PNA.     Subjective:   [name] is doing ok. Still in aFib with RVR  Appreciated Dr. Jackalyn Lombard assistance  He would not recommend increasing Tikosyn at this point since she is on Levaquin.  On for  DC cardioversion tomorrow with Dr. Lovena Le   breathing is better    Objective:    Vital Signs:   Temp:  [97.7 F (36.5 C)-98.6 F (37 C)] 98.3 F (36.8 C) (09/29 0807) Pulse Rate:  [59-115] 99 (09/29 0807) Resp:  [16-20] 16 (09/29 0418) BP: (96-164)/(50-148) 133/53 (09/29 0807) SpO2:  [92 %-98 %] 92 % (09/29 0807) Weight:  [260 lb 5.8 oz (118.1 kg)-261 lb 3.9 oz (118.5 kg)] 260 lb 5.8 oz (118.1 kg) (09/29 0520)  Last BM Date: 03/30/16   24-hour weight change: Weight change: -13.7 oz (-0.388 kg)  Weight trends: Filed Weights   03/30/16 0405 03/31/16 0418 03/31/16 0520  Weight: 262 lb 1.6 oz (118.9 kg) 261 lb 3.9 oz (118.5 kg) 260 lb 5.8 oz (118.1 kg)    Intake/Output:  09/28 0701 - 09/29 0700 In: 540 [P.O.:490; IV Piggyback:50] Out: 4270 [Urine:1900] No intake/output data recorded.   Physical Exam: BP (!) 133/53 (BP Location: Left Wrist)   Pulse 99   Temp 98.3 F (36.8 C) (Oral)   Resp 16   Ht 5\' 7"  (1.702 m) Comment: stated.   Wt 260 lb 5.8 oz (118.1 kg)   SpO2 92%   BMI 40.78 kg/m   Wt Readings from Last 3 Encounters:  03/31/16 260 lb 5.8 oz (118.1 kg)  01/28/16 271 lb 12.8 oz (123.3 kg)  12/23/15 272 lb 4.8 oz (123.5 kg)    General: Vital signs reviewed and noted.   Head: Normocephalic, atraumatic.  Eyes: conjunctivae/corneas clear.  EOM's intact.   Throat: normal  Neck:  normal     Lungs:    coarse rales - right base   Heart:  jirreg. Irreg. Tachycardia   Abdomen:  Soft, non-tender, non-distended    Extremities: Left calf is larger than the left calf.    Neurologic: A&O X3, CN II - XII are grossly intact.   Psych: Normal     Labs: BMET:  Recent Labs  03/30/16 0246 03/31/16 0248  NA 135 136  K 4.2 4.0  CL 97* 99*  CO2 29 26  GLUCOSE 139* 171*  BUN 23* 28*  CREATININE 0.95 0.99  CALCIUM 9.1 9.3  MG  --  2.0    Liver function tests: No results for input(s): AST, ALT, ALKPHOS, BILITOT, PROT, ALBUMIN in the last 72 hours. No results for input(s): LIPASE, AMYLASE in the last 72 hours.  CBC:  Recent Labs  03/31/16 0248  WBC 9.8  HGB 13.5  HCT 41.9  MCV 92.1  PLT 354    Cardiac Enzymes: No results for input(s): CKTOTAL, CKMB, TROPONINI in the last 72 hours.  Coagulation Studies: No results for input(s): LABPROT, INR in the last 72 hours.  Other: Invalid input(s): POCBNP No results for input(s): DDIMER in the last 72 hours. No results for input(s): HGBA1C in the last 72 hours.  Recent Labs  03/31/16 0248  CHOL 100  HDL 22*  LDLCALC 49  TRIG 143  CHOLHDL 4.5   No results for input(s): TSH, T4TOTAL, T3FREE, THYROIDAB in the last 72 hours.  Invalid input(s): FREET3 No results for input(s): VITAMINB12, FOLATE, FERRITIN, TIBC, IRON, RETICCTPCT in the last 72 hours.   Other results:  EKG  ( personally reviewed )  - - A-fib with V rate of 108.   Medications:    Infusions:    Scheduled Medications: . aspirin EC  81 mg Oral QHS  . atorvastatin  40 mg Oral Daily  . cefTRIAXone (ROCEPHIN)  IV  1 g Intravenous Q24H  . dabigatran  150 mg Oral Q12H  . diltiazem  180 mg Oral Daily  . dofetilide  250 mcg Oral BID  . doxycycline  100 mg Oral Q12H  . FLUoxetine  20 mg Oral q morning - 10a  . furosemide  60 mg Intravenous BID  . insulin aspart  0-5 Units Subcutaneous QHS  . insulin aspart  0-9 Units Subcutaneous TID WC  .  levalbuterol  0.63 mg Nebulization BID  . losartan  50 mg Oral Daily  . mouth rinse  15 mL Mouth Rinse BID  . metoprolol succinate  100 mg Oral BID  . multivitamin with minerals  1 tablet Oral Daily  . potassium chloride SA  20 mEq Oral BID    Assessment/ Plan:   Principal Problem:   Acute diastolic heart failure (HCC) Active Problems:   Chronic diastolic heart failure- grade 2 diastolic dysfunction by echo 10/03/13   Atrial fibrillation with RVR (HCC)   Acute respiratory failure with hypoxia (HCC)   Chronic atrial fibrillation (HCC)   HLD (hyperlipidemia)   Controlled type 2 diabetes mellitus without complication ([place])  1. Atrial fib:    Was seen by EP    will continue Tikosyn at the present dose - will avoid increaing while she is on Levaquin Scheduled for DC cardioversion Friday with Dr. Lovena Le    2. Pneumonia - ? Viral pneumonia  Has significant rales in the right base. Breathing is better.   Doing well with the nebs   Disposition:  Length of Stay: 5  Thayer Headings, Brooke Bonito., MD, [facility] 03/31/2016, 8:50 AM Office [phone] Pager [phone]

## 2016-03-31 NOTE — Progress Notes (Signed)
RT NOTE:  Pt will put home CPAP on when she is ready.

## 2016-03-31 NOTE — Interval H&P Note (Signed)
History and Physical Interval Note:  03/31/2016 2:16 PM  [name]  has presented today for surgery, with the diagnosis of afib  The various methods of treatment have been discussed with the patient and family. After consideration of risks, benefits and other options for treatment, the patient has consented to  Procedure(s): Cardioversion (N/A) as a surgical intervention .  The patient's history has been reviewed, patient examined, no change in status, stable for surgery.  I have reviewed the patient's chart and labs.  Questions were answered to the patient's satisfaction.     Cristopher Peru

## 2016-03-31 NOTE — CV Procedure (Signed)
EP Procedure Note  Procedure performed: DCCV  Preoperative diagnosis: atrial fib with a RVR  Postoperative diagnosis: atrial fib with an RVR  Description of the procedure: after informed consent was obtained, the patient was prepped and draped in the usual fashion. She was sedated with  10 Mg of Versed and  100 Mcg of Fentanyl. 200 joules of synchronized biphasic energy was applied, restoring NSR. The patient tolerated the procedure well.    Complications: None immediately  Conclusion: Successful DCCV with 200 joules, restoring NSR.

## 2016-03-31 NOTE — Progress Notes (Signed)
SUBJECTIVE: The patient is doing well today.  At this time, she denies chest pain, c/w a cough, no shortness of breath, no new concerns.  [name] aspirin EC  81 mg Oral QHS  . atorvastatin  40 mg Oral Daily  . cefTRIAXone (ROCEPHIN)  IV  1 g Intravenous Q24H  . dabigatran  150 mg Oral Q12H  . diltiazem  180 mg Oral Daily  . dofetilide  250 mcg Oral BID  . doxycycline  100 mg Oral Q12H  . FLUoxetine  20 mg Oral q morning - 10a  . furosemide  60 mg Intravenous BID  . insulin aspart  0-5 Units Subcutaneous QHS  . insulin aspart  0-9 Units Subcutaneous TID WC  . levalbuterol  0.63 mg Nebulization BID  . losartan  50 mg Oral Daily  . mouth rinse  15 mL Mouth Rinse BID  . metoprolol succinate  100 mg Oral BID  . multivitamin with minerals  1 tablet Oral Daily  . potassium chloride SA  20 mEq Oral BID      OBJECTIVE: Physical Exam: Vitals:   03/31/16 0807 03/31/16 0852 03/31/16 0853 03/31/16 0915  BP: (!) 133/53 (!) 133/53 (!) 133/53   Pulse: 99 (!) 110    Resp:      Temp: 98.3 F (36.8 C)     TempSrc: Oral     SpO2: 92%   92%  Weight:      Height:        Intake/Output Summary (Last 24 hours) at 03/31/16 1007 Last data filed at 03/31/16 0521  Gross per 24 hour  Intake              540 ml  Output             1900 ml  Net            -1360 ml    Telemetry reveals AFib, 90's-110  GEN- The patient is well appearing, in NAD alert and oriented x 3 today.   Head- normocephalic, atraumatic Eyes-  Sclera clear, conjunctiva pink Ears- hearing intact Oropharynx- clear Neck- supple, no JVP Lungs- rales at the bases normal work of breathing Heart- IRRR, tachycardic, no significant murmurs, no rubs or gallops GI- soft, NT, ND Extremities- no clubbing, cyanosis, or edema Skin- no rash or lesion Psych- euthymic mood, full affect Neuro- no gross deficits appreciated  LABS: Basic Metabolic Panel:  Recent Labs  03/30/16 0246 03/31/16 0248  NA 135 136  K 4.2 4.0  CL 97* 99*   CO2 29 26  GLUCOSE 139* 171*  BUN 23* 28*  CREATININE 0.95 0.99  CALCIUM 9.1 9.3  MG  --  2.0    CBC:  Recent Labs  03/31/16 0248  WBC 9.8  HGB 13.5  HCT 41.9  MCV 92.1  PLT 354   Fasting Lipid Panel:  Recent Labs  03/31/16 0248  CHOL 100  HDL 22*  LDLCALC 49  TRIG 143  CHOLHDL 4.5      ASSESSMENT AND PLAN:   1. Persistent AFib, hx of tachy/brady w/ PPM      CHA2DS2Vasc is at least 5 on Pradaxa     The patient reports taking her paradaxa out patient without fail BID, assures no missed dose, she has gotten in-patient as well, planned for DCCV this afternoon.       Discussed DCCV procedure, risks/benefits, she wants to proceed      On Tikosyn chronically and maintaining SR well until  resp insuff.      K+ 4.0, Mag 2.0, Creat 0.99, QTc stable by last EKG      Avoid QT prolonging meds  2. Respiratory insufficiency     Continues to improve     pneumonia +/-  fluid OL     Cumulatively fluid negative -5305,labs stable    Tommye Standard, PA-C 03/31/2016 10:07 AM  Ep Attending  Patient seen and examined. Agree with above. She is stable for DCCV. I have discussed the indications, risk/benefit/goals/expectations and she wishes to proceed.  Mikle Bosworth.D.

## 2016-04-01 ENCOUNTER — Inpatient Hospital Stay (HOSPITAL_COMMUNITY): Payer: PPO

## 2016-04-01 DIAGNOSIS — Z7901 Long term (current) use of anticoagulants: Secondary | ICD-10-CM | POA: Diagnosis not present

## 2016-04-01 DIAGNOSIS — I2510 Atherosclerotic heart disease of native coronary artery without angina pectoris: Secondary | ICD-10-CM | POA: Diagnosis not present

## 2016-04-01 DIAGNOSIS — J9601 Acute respiratory failure with hypoxia: Secondary | ICD-10-CM | POA: Diagnosis not present

## 2016-04-01 DIAGNOSIS — I5033 Acute on chronic diastolic (congestive) heart failure: Secondary | ICD-10-CM | POA: Diagnosis not present

## 2016-04-01 DIAGNOSIS — I110 Hypertensive heart disease with heart failure: Secondary | ICD-10-CM | POA: Diagnosis not present

## 2016-04-01 DIAGNOSIS — I5031 Acute diastolic (congestive) heart failure: Secondary | ICD-10-CM | POA: Diagnosis not present

## 2016-04-01 DIAGNOSIS — Z6841 Body Mass Index (BMI) 40.0 and over, adult: Secondary | ICD-10-CM | POA: Diagnosis not present

## 2016-04-01 DIAGNOSIS — I272 Pulmonary hypertension, unspecified: Secondary | ICD-10-CM | POA: Diagnosis not present

## 2016-04-01 DIAGNOSIS — R918 Other nonspecific abnormal finding of lung field: Secondary | ICD-10-CM | POA: Diagnosis not present

## 2016-04-01 DIAGNOSIS — I252 Old myocardial infarction: Secondary | ICD-10-CM | POA: Diagnosis not present

## 2016-04-01 DIAGNOSIS — E119 Type 2 diabetes mellitus without complications: Secondary | ICD-10-CM | POA: Diagnosis not present

## 2016-04-01 DIAGNOSIS — I480 Paroxysmal atrial fibrillation: Secondary | ICD-10-CM

## 2016-04-01 DIAGNOSIS — I481 Persistent atrial fibrillation: Secondary | ICD-10-CM | POA: Diagnosis not present

## 2016-04-01 DIAGNOSIS — I482 Chronic atrial fibrillation: Secondary | ICD-10-CM | POA: Diagnosis not present

## 2016-04-01 LAB — COMPREHENSIVE METABOLIC PANEL
ALT: 53 U/L (ref 14–54)
AST: 33 U/L (ref 15–41)
Albumin: 2.7 g/dL — ABNORMAL LOW (ref 3.5–5.0)
Alkaline Phosphatase: 153 U/L — ABNORMAL HIGH (ref 38–126)
Anion gap: 11 (ref 5–15)
BUN: 31 mg/dL — ABNORMAL HIGH (ref 6–20)
CO2: 27 mmol/L (ref 22–32)
Calcium: 9 mg/dL (ref 8.9–10.3)
Chloride: 98 mmol/L — ABNORMAL LOW (ref 101–111)
Creatinine, Ser: 1.05 mg/dL — ABNORMAL HIGH (ref 0.44–1.00)
GFR calc Af Amer: >60 mL/min (ref >60)
GFR calc non Af Amer: 53 mL/min — ABNORMAL LOW (ref >60)
Glucose, Bld: 113 mg/dL — ABNORMAL HIGH (ref 65–99)
Potassium: 4.3 mmol/L (ref 3.5–5.1)
Sodium: 136 mmol/L (ref 135–145)
Total Bilirubin: 0.7 mg/dL (ref 0.3–1.2)
Total Protein: 6.3 g/dL — ABNORMAL LOW (ref 6.5–8.1)

## 2016-04-01 LAB — CBC
HCT: 43.1 % (ref 36.0–46.0)
Hemoglobin: 13.7 g/dL (ref 12.0–15.0)
MCH: 29.7 pg (ref 26.0–34.0)
MCHC: 31.8 g/dL (ref 30.0–36.0)
MCV: 93.3 fL (ref 78.0–100.0)
Platelets: 340 10*3/uL (ref 150–400)
RBC: 4.62 MIL/uL (ref 3.87–5.11)
RDW: 15.8 % — ABNORMAL HIGH (ref 11.5–15.5)
WBC: 10.1 10*3/uL (ref 4.0–10.5)

## 2016-04-01 LAB — GLUCOSE, CAPILLARY
Glucose-Capillary: 131 mg/dL — ABNORMAL HIGH (ref 65–99)
Glucose-Capillary: 109 mg/dL — ABNORMAL HIGH (ref 65–99)
Glucose-Capillary: 107 mg/dL — ABNORMAL HIGH (ref 65–99)
Glucose-Capillary: 143 mg/dL — ABNORMAL HIGH (ref 65–99)

## 2016-04-01 LAB — CULTURE, BLOOD (ROUTINE X 2)
Culture: NO GROWTH
Culture: NO GROWTH

## 2016-04-01 LAB — MAGNESIUM: Magnesium: 2.2 mg/dL (ref 1.7–2.4)

## 2016-04-01 MED ORDER — BENZONATATE 100 MG PO CAPS
200.0000 mg | ORAL_CAPSULE | Freq: Three times a day (TID) | ORAL | Status: DC | PRN
  Administered 2016-04-04: 200 mg via ORAL
  Filled 2016-04-01: qty 2

## 2016-04-01 NOTE — Progress Notes (Signed)
Report called to [facility] . No questions or concerns regarding transfer.      Tyron Manetta RN

## 2016-04-01 NOTE — Progress Notes (Signed)
Primary cardiologist: Dr. Mertie Moores  Seen for followup: PAF  Subjective:    Feels better, no chest pain or active cough. Appetite good. Has not ambulated much as yet.  Objective:   Temp:  [97.4 F (36.3 C)-98.2 F (36.8 C)] 97.6 F (36.4 C) (09/30 0727) Pulse Rate:  [0-114] 60 (09/30 0727) Resp:  [0-30] 18 (09/30 0727) BP: (92-133)/(53-93) 131/66 (09/30 0727) SpO2:  [0 %-99 %] 96 % (09/30 0727) Weight:  [260 lb 6.4 oz (118.1 kg)] 260 lb 6.4 oz (118.1 kg) (09/30 0321) Last BM Date: 03/30/16  Filed Weights   03/31/16 0418 03/31/16 0520 04/01/16 0321  Weight: 261 lb 3.9 oz (118.5 kg) 260 lb 5.8 oz (118.1 kg) 260 lb 6.4 oz (118.1 kg)    Intake/Output Summary (Last 24 hours) at 04/01/16 0835 Last data filed at 04/01/16 0700  Gross per 24 hour  Intake              120 ml  Output              650 ml  Net             -530 ml    Telemetry: Sinus rhythm.  Exam:  General: Appears comfortable.  Lungs: Overall clear, no wheezing.  Cardiac: RRR, no gallop.  Extremities: No pitting edema.  Lab Results:  Basic Metabolic Panel:  Recent Labs Lab 03/28/16 0250  03/30/16 0246 03/31/16 0248 04/01/16 0411  NA 136  < > 135 136 136  K 4.1  < > 4.2 4.0 4.3  CL 97*  < > 97* 99* 98*  CO2 27  < > 29 26 27   GLUCOSE 141*  < > 139* 171* 113*  BUN 15  < > 23* 28* 31*  CREATININE 0.89  < > 0.95 0.99 1.05*  CALCIUM 9.0  < > 9.1 9.3 9.0  MG 2.0  --   --  2.0 2.2  < > = values in this interval not displayed.  Liver Function Tests:  Recent Labs Lab 04/01/16 0411  AST 33  ALT 53  ALKPHOS 153*  BILITOT 0.7  PROT 6.3*  ALBUMIN 2.7*    CBC:  Recent Labs Lab 03/28/16 0250 03/31/16 0248 04/01/16 0411  WBC 11.7* 9.8 10.1  HGB 13.1 13.5 13.7  HCT 40.3 41.9 43.1  MCV 92.9 92.1 93.3  PLT 234 354 340    Cardiac Enzymes:  Recent Labs Lab 03/26/16 2009 03/27/16 0202 03/27/16 0754  TROPONINI 0.04* <0.03 <0.03    ECG: Sinus rhythm with PAC, QTc 447  ms.  Echocardiogram 03/28/2016: Study Conclusions  - Left ventricle: The cavity size was normal. Wall thickness was   increased in a pattern of mild LVH. Systolic function was normal.   The estimated ejection fraction was in the range of 55% to 60%.   Indeterminant diastolic function (atrial fibrillation). Wall   motion was normal; there were no regional wall motion   abnormalities. - Aortic valve: There was no stenosis. - Mitral valve: Mildly calcified annulus. There was no significant   regurgitation. - Left atrium: The atrium was mildly dilated. - Right ventricle: The cavity size was normal. Pacer wire or   catheter noted in right ventricle. Systolic function was normal. - Tricuspid valve: Peak RV-RA gradient (S): 22 mm Hg. - Pulmonary arteries: PA peak pressure: 30 mm Hg (S). - Systemic veins: IVC measured 2.3 cm with normal respirophasic   variation, suggesting RA pressure 8 mmHg.  Impressions:  -  The patient was in atrial fibrillation with rapid ventricular   response. Normal LV size with mild LV hypertrophy, EF 55-60%.   .Normal RV size and systolic function. No significant valvular   abnormalities.  Medications:   Scheduled Medications: . aspirin EC  81 mg Oral QHS  . atorvastatin  40 mg Oral Daily  . cefTRIAXone (ROCEPHIN)  IV  1 g Intravenous Q24H  . dabigatran  150 mg Oral Q12H  . diltiazem  180 mg Oral Daily  . dofetilide  250 mcg Oral BID  . doxycycline  100 mg Oral Q12H  . FLUoxetine  20 mg Oral q morning - 10a  . furosemide  60 mg Intravenous BID  . insulin aspart  0-5 Units Subcutaneous QHS  . insulin aspart  0-9 Units Subcutaneous TID WC  . levalbuterol  0.63 mg Nebulization BID  . losartan  50 mg Oral Daily  . mouth rinse  15 mL Mouth Rinse BID  . metoprolol succinate  100 mg Oral BID  . multivitamin with minerals  1 tablet Oral Daily  . potassium chloride SA  20 mEq Oral BID      PRN Medications:  acetaminophen, benzonatate, levalbuterol,  metoprolol, ondansetron (ZOFRAN) IV, Polyethyl Glycol-Propyl Glycol   Assessment:   1. Acute on chronic diastolic heart failure. LVEF 69-62% (grade 2 diastolic dysfunction). Has diuresed about 5000 cc net so far. Clinically improved. CXR still with some interstitial edema.  2. PAF, recent recurrence with suspected bronchitis. Status post DCCV 9/29 with restoration of sinus rhythm. Continues on Tikosyn, Toprol XL, and Pradaxa. ECG reviewed.  3. SSS status post [employer] PPM, followed by Dr. Lovena Le.   Plan/Discussion:    Rhythm stable after DCCV, can increase activity and likely transfer to telemetry. Continue IV Lasix for now. No other change in cardiac regimen.   Satira Sark, M.D., F.A.C.C.

## 2016-04-01 NOTE — Progress Notes (Signed)
TEAM 1 - Stepdown/ICU TEAM  [name]  KGM:[id number] DOB: May 18, 1946 DOA: 03/26/2016 PCP: Lottie Dawson, MD    Brief Narrative:  70 y.o.femalewith history of CHF, A.Fib on Tykosin, and pacemaker who presented to the ED with SOB which was worse with activity or laying down, better when sitting upright. She was placed on BiPAP and admitted to stepdown for a CHF exacerbation.  On the second day of admission she spiked a temperature of 101 raising the suspicion of possible underlying pneumonia.  Subjective: Pt is resting comfortably in bed.  She denies cp, n/v, abdom pain, or sob.  She is anxious to get up and get moving.  She has a good appetite.    Assessment & Plan:  Acute hypoxic respiratory failure due to acute exacerbation of grade 2 diastolic CHF Net negative approximately 6.3 L thus far - continues to require modestly high level of O2 support - ambulate and follow  Filed Weights   03/31/16 0418 03/31/16 0520 04/01/16 0321  Weight: 118.5 kg (261 lb 3.9 oz) 118.1 kg (260 lb 5.8 oz) 118.1 kg (260 lb 6.4 oz)   FUO - suspected acute bronchitis  Chest x-ray without focal infiltrate but the patient did have right basilar crackles - UA unrevealing - WBC normalized -  empiric antibiotics to stop after 7 days of tx - dry cough persists   Chronic A. fib with RVR CHA2DS2 - VASc is at least 5 - s/p DCCV 9/29 and presently in NSR - Cards following - on Pradaxa   HTN Blood pressure controlled  Morbid obesity - Body mass index is 40.78 kg/m.  OSA  Hyperlipidemia Continue home medical therapy  DM2 Newly diagnosed in June 2017 - CBGs controlled   DVT prophylaxis: Pradaxa Code Status: FULL CODE Family Communication: spoke w/ husband at bedside  Disposition Plan: transfer to tele - ambulate - follow sats - cont to diurese   Consultants:  [facility] Cardiology + EP  Procedures: none  Antimicrobials:  Ceftriaxone 9/25 > 10/1 Doxycycline 9/26 > 10/1 Levaquin  9/25  Objective: Blood pressure (!) 118/54, pulse 66, temperature 98.1 F (36.7 C), temperature source Oral, resp. rate 18, height 5\' 7"  (1.702 m), weight 118.1 kg (260 lb 6.4 oz), SpO2 95 %.  Intake/Output Summary (Last 24 hours) at 04/01/16 1327 Last data filed at 04/01/16 1229  Gross per 24 hour  Intake              320 ml  Output             1350 ml  Net            -1030 ml   Filed Weights   03/31/16 0418 03/31/16 0520 04/01/16 0321  Weight: 118.5 kg (261 lb 3.9 oz) 118.1 kg (260 lb 5.8 oz) 118.1 kg (260 lb 6.4 oz)    Examination: General: No acute respiratory distress  Lungs: CTA th/o - no wheeze  Cardiovascular: RRR w/o M or gallup  Abdomen: Nontender, nondistended, soft, bowel sounds positive, no rebound, no ascites, no appreciable mass Extremities: No significant cyanosis, clubbing, or edema bilateral lower extremities  CBC:  Recent Labs Lab 03/26/16 1630 03/26/16 1642 03/28/16 0250 03/31/16 0248 04/01/16 0411  WBC 11.8*  --  11.7* 9.8 10.1  NEUTROABS 7.9*  --   --   --   --   HGB 12.8 13.9 13.1 13.5 13.7  HCT 39.3 41.0 40.3 41.9 43.1  MCV 92.9  --  92.9 92.1 93.3  PLT 248  --  234 354 562   Basic Metabolic Panel:  Recent Labs Lab 03/26/16 2009  03/28/16 0250 03/29/16 0227 03/30/16 0246 03/31/16 0248 04/01/16 0411  NA  --   < > 136 137 135 136 136  K  --   < > 4.1 4.5 4.2 4.0 4.3  CL  --   < > 97* 99* 97* 99* 98*  CO2  --   < > 27 30 29 26 27   GLUCOSE  --   < > 141* 137* 139* 171* 113*  BUN  --   < > 15 16 23* 28* 31*  CREATININE  --   < > 0.89 1.01* 0.95 0.99 1.05*  CALCIUM  --   < > 9.0 8.9 9.1 9.3 9.0  MG 2.0  --  2.0  --   --  2.0 2.2  < > = values in this interval not displayed. GFR: Estimated Creatinine Clearance: 66.3 mL/min (by C-G formula based on SCr of 1.05 mg/dL (H)).  Cardiac Enzymes:  Recent Labs Lab 03/26/16 2009 03/27/16 0202 03/27/16 0754  TROPONINI 0.04* <0.03 <0.03    HbA1C: Hgb A1c MFr Bld  Date/Time Value Ref  Range Status  12/20/2015 09:28 AM 6.7 (H) 4.6 - 6.5 % Final    Comment:    Glycemic Control Guidelines for People with Diabetes:Non Diabetic:  <6%Goal of Therapy: <7%Additional Action Suggested:  >8%   06/04/2015 08:57 AM 6.5 4.6 - 6.5 % Final    Comment:    Glycemic Control Guidelines for People with Diabetes:Non Diabetic:  <6%Goal of Therapy: <7%Additional Action Suggested:  >8%     CBG:  Recent Labs Lab 03/31/16 1138 03/31/16 1557 03/31/16 2201 04/01/16 0726 04/01/16 1145  GLUCAP 118* 119* 124* 131* 109*    Recent Results (from the past 240 hour(s))  MRSA PCR Screening     Status: None   Collection Time: 03/26/16  9:40 PM  Result Value Ref Range Status   MRSA by PCR NEGATIVE NEGATIVE Final    Comment:        The GeneXpert MRSA Assay (FDA approved for NASAL specimens only), is one component of a comprehensive MRSA colonization surveillance program. It is not intended to diagnose MRSA infection nor to guide or monitor treatment for MRSA infections.   Culture, blood (routine x 2)     Status: None (Preliminary result)   Collection Time: 03/27/16  3:40 PM  Result Value Ref Range Status   Specimen Description BLOOD RIGHT HAND  Final   Special Requests IN PEDIATRIC BOTTLE 2CC  Final   Culture NO GROWTH 4 DAYS  Final   Report Status PENDING  Incomplete  Culture, blood (routine x 2)     Status: None (Preliminary result)   Collection Time: 03/27/16  3:50 PM  Result Value Ref Range Status   Specimen Description BLOOD LEFT HAND  Final   Special Requests IN PEDIATRIC BOTTLE .[place]  Final   Culture NO GROWTH 4 DAYS  Final   Report Status PENDING  Incomplete  Urine culture     Status: None   Collection Time: 03/28/16 10:50 AM  Result Value Ref Range Status   Specimen Description URINE, RANDOM  Final   Special Requests NONE  Final   Culture NO GROWTH  Final   Report Status 03/29/2016 FINAL  Final     Scheduled Meds: . aspirin EC  81 mg Oral QHS  . atorvastatin  40 mg  Oral Daily  . cefTRIAXone (ROCEPHIN)  IV  1 g Intravenous Q24H  . dabigatran  150 mg Oral Q12H  . diltiazem  180 mg Oral Daily  . dofetilide  250 mcg Oral BID  . doxycycline  100 mg Oral Q12H  . FLUoxetine  20 mg Oral q morning - 10a  . furosemide  60 mg Intravenous BID  . insulin aspart  0-5 Units Subcutaneous QHS  . insulin aspart  0-9 Units Subcutaneous TID WC  . levalbuterol  0.63 mg Nebulization BID  . losartan  50 mg Oral Daily  . mouth rinse  15 mL Mouth Rinse BID  . metoprolol succinate  100 mg Oral BID  . multivitamin with minerals  1 tablet Oral Daily  . potassium chloride SA  20 mEq Oral BID    LOS: 6 days   Cherene Altes, MD [facility] Office  [phone] Pager - Text Page per Amion as per below:  On-Call/Text Page:      [name]      password [email or web address]  If 7PM-7AM, please contact night-coverage [email or web address] Password [email or web address] 04/01/2016, 1:27 PM

## 2016-04-01 NOTE — Progress Notes (Signed)
Patient self placed on CPAP and doing well. Will assist if needed later

## 2016-04-02 DIAGNOSIS — I5031 Acute diastolic (congestive) heart failure: Secondary | ICD-10-CM | POA: Diagnosis not present

## 2016-04-02 LAB — BASIC METABOLIC PANEL
Anion gap: 9 (ref 5–15)
BUN: 30 mg/dL — ABNORMAL HIGH (ref 6–20)
CO2: 31 mmol/L (ref 22–32)
Calcium: 9.3 mg/dL (ref 8.9–10.3)
Chloride: 98 mmol/L — ABNORMAL LOW (ref 101–111)
Creatinine, Ser: 1.03 mg/dL — ABNORMAL HIGH (ref 0.44–1.00)
GFR calc Af Amer: >60 mL/min (ref >60)
GFR calc non Af Amer: 54 mL/min — ABNORMAL LOW (ref >60)
Glucose, Bld: 120 mg/dL — ABNORMAL HIGH (ref 65–99)
Potassium: 4.4 mmol/L (ref 3.5–5.1)
Sodium: 138 mmol/L (ref 135–145)

## 2016-04-02 LAB — GLUCOSE, CAPILLARY
Glucose-Capillary: 137 mg/dL — ABNORMAL HIGH (ref 65–99)
Glucose-Capillary: 147 mg/dL — ABNORMAL HIGH (ref 65–99)
Glucose-Capillary: 123 mg/dL — ABNORMAL HIGH (ref 65–99)
Glucose-Capillary: 120 mg/dL — ABNORMAL HIGH (ref 65–99)

## 2016-04-02 NOTE — Progress Notes (Signed)
Patient Name: [name] Date of Encounter: 04/02/2016  Hospital Problem List     Principal Problem:   Acute diastolic heart failure (HCC) Active Problems:   Acute on chronic diastolic congestive heart failure (HCC)   Atrial fibrillation with RVR (HCC)   Acute respiratory failure with hypoxia (HCC)   Chronic atrial fibrillation (HCC)   HLD (hyperlipidemia)   Controlled type 2 diabetes mellitus without complication [facility])    Patient Profile     70 y/o femalewith coronary artery disease, chronic diastolic dysfunction, stroke, paroxysmal atrial fibrillation on Tikosyn and Pradaxa,symptomatic tachycardia bradycardia syndrome, status post permanent pacemaker insertion and OSA compliant with CPAP, admitted for hypoxic respiratory failure, in the setting of a/c CHF and respiratory infection. ? Flu vs PNA. On droplet precaution.  Subjective   Feels good.  Walked with room air and sats fell to 89 only.    Inpatient Medications    . aspirin EC  81 mg Oral QHS  . atorvastatin  40 mg Oral Daily  . cefTRIAXone (ROCEPHIN)  IV  1 g Intravenous Q24H  . dabigatran  150 mg Oral Q12H  . diltiazem  180 mg Oral Daily  . dofetilide  250 mcg Oral BID  . doxycycline  100 mg Oral Q12H  . FLUoxetine  20 mg Oral q morning - 10a  . furosemide  60 mg Intravenous BID  . insulin aspart  0-5 Units Subcutaneous QHS  . insulin aspart  0-9 Units Subcutaneous TID WC  . losartan  50 mg Oral Daily  . metoprolol succinate  100 mg Oral BID  . multivitamin with minerals  1 tablet Oral Daily  . potassium chloride SA  20 mEq Oral BID    Vital Signs    Vitals:   04/01/16 1621 04/01/16 2000 04/01/16 2219 04/02/16 0647  BP: 121/70 136/61 136/60 126/65  Pulse: 67  71 71  Resp: 18  18 18   Temp: 98.3 F (36.8 C) 98.9 F (37.2 C) 98.1 F (36.7 C) 97.9 F (36.6 C)  TempSrc: Oral Oral Oral Oral  SpO2: 94%  98% 93%  Weight:   261 lb 6.4 oz (118.6 kg) 259 lb 9.6 oz (117.8 kg)  Height:   5\' 6"  (1.676 m)      Intake/Output Summary (Last 24 hours) at 04/02/16 1024 Last data filed at 04/02/16 0900  Gross per 24 hour  Intake              540 ml  Output             1250 ml  Net             -710 ml   Filed Weights   04/01/16 0321 04/01/16 2219 04/02/16 0647  Weight: 260 lb 6.4 oz (118.1 kg) 261 lb 6.4 oz (118.6 kg) 259 lb 9.6 oz (117.8 kg)    Physical Exam    GEN: Well nourished, well developed, in  no acute distress.  Neck: Supple, no JVD, carotid bruits, or masses. Cardiac: RRR, no rubs, or gallops. No clubbing, cyanosis, trace edema.  Radials/DP/PT 2+ and equal bilaterally.  Respiratory:  Respirations  regular and unlabored, crackles at the right base. GI: Soft, nontender, nondistended, BS + x 4. Neuro:  Strength and sensation are intact.   Labs    CBC  Recent Labs  03/31/16 0248 04/01/16 0411  WBC 9.8 10.1  HGB 13.5 13.7  HCT 41.9 43.1  MCV 92.1 93.3  PLT 354 825   Basic Metabolic  Panel  Recent Labs  03/31/16 0248 04/01/16 0411 04/02/16 0448  NA 136 136 138  K 4.0 4.3 4.4  CL 99* 98* 98*  CO2 26 27 31   GLUCOSE 171* 113* 120*  BUN 28* 31* 30*  CREATININE 0.99 1.05* 1.03*  CALCIUM 9.3 9.0 9.3  MG 2.0 2.2  --    Liver Function Tests  Recent Labs  04/01/16 0411  AST 33  ALT 53  ALKPHOS 153*  BILITOT 0.7  PROT 6.3*  ALBUMIN 2.7*   No results for input(s): LIPASE, AMYLASE in the last 72 hours. Cardiac Enzymes No results for input(s): CKTOTAL, CKMB, CKMBINDEX, TROPONINI in the last 72 hours. BNP Invalid input(s): POCBNP D-Dimer No results for input(s): DDIMER in the last 72 hours. Hemoglobin A1C No results for input(s): HGBA1C in the last 72 hours. Fasting Lipid Panel  Recent Labs  03/31/16 0248  CHOL 100  HDL 22*  LDLCALC 49  TRIG 143  CHOLHDL 4.5   Thyroid Function Tests No results for input(s): TSH, T4TOTAL, T3FREE, THYROIDAB in the last 72 hours.  Invalid input(s): FREET3  Telemetry    NSR  ECG    NA  Radiology    Dg  Chest 2 View  Result Date: 03/27/2016 CLINICAL DATA:  Fever for 1 day, dry cough and shortness of breath for 5 days. EXAM: CHEST  2 VIEW COMPARISON:  Chest x-rays dated 03/26/2016 and 03/20/2014. FINDINGS: Mild cardiomegaly is stable. There is stable central pulmonary vascular congestion and mild perihilar edema. No new lung findings. No confluent airspace opacity to suggest a consolidating pneumonia. No pleural effusion or pneumothorax seen. Atherosclerotic changes noted at the aortic arch. Left chest wall pacemaker/ICD in place. Osseous and soft tissue structures about the chest are otherwise unremarkable. IMPRESSION: 1. Cardiomegaly with central pulmonary vascular congestion suggesting mild CHF. Similar appearance seen on multiple prior chest x-rays suggesting a chronic mild CHF. 2. No new findings.  No evidence of pneumonia. 3. Aortic atherosclerosis. Electronically Signed   By: Franki Cabot M.D.   On: 03/27/2016 18:50   Dg Chest Port 1 View  Result Date: 04/01/2016 CLINICAL DATA:  70 year old female with pulmonary edema. Initial encounter. EXAM: PORTABLE CHEST 1 VIEW COMPARISON:  03/27/2016 and earlier. FINDINGS: Portable AP semi upright view at 0545 hours. Stable cardiomegaly and mediastinal contours. Stable left chest cardiac pacer. Continued and mildly progressed basilar predominant increased interstitial opacity. No pneumothorax or pleural effusion identified. No consolidation. Calcified aortic atherosclerosis. Visualized tracheal air column is within normal limits. IMPRESSION: 1. Mild progression of basilar predominant increased interstitial opacity compatible with interstitial edema. 2. No new cardiopulmonary abnormality. 3.  Calcified aortic atherosclerosis. Electronically Signed   By: Genevie Ann M.D.   On: 04/01/2016 07:47   Dg Chest Port 1 View  Result Date: 03/26/2016 CLINICAL DATA:  Shortness of breath for 3 days. History of atrial fibrillation. EXAM: PORTABLE CHEST 1 VIEW COMPARISON:  PA and  lateral chest 03/21/2014. FINDINGS: There is cardiomegaly and mild vascular congestion. No consolidative process, pneumothorax or effusion. Aortic atherosclerosis is identified. Pacing device is unchanged. IMPRESSION: Cardiomegaly and vascular congestion. Atherosclerosis. Electronically Signed   By: Inge Rise M.D.   On: 03/26/2016 16:48    Assessment & Plan    ATRIAL FIB:  [name] has a CHA2DS2 - VASc score of 6.  Status post DCCV.   Continue current therapy with Tikosyn and anticoagulation.   ACUTE ON CHRONIC DIASTOLIC HF:  She does have some right basilar crackles.  However, breathing better.   I would suggest continuing IV diuresis today while she is still here.   Signed, Minus Breeding, MD  04/02/2016, 10:24 AM

## 2016-04-02 NOTE — Progress Notes (Signed)
[name]  DQQ:[id number] DOB: 11-15-1945 DOA: 03/26/2016 PCP: Lottie Dawson, MD    Brief Narrative:  70 y.o.femalewith history of CHF, A.Fib on Tykosin, and pacemaker who presented to the ED with SOB which was worse with activity or laying down, better when sitting upright. She was placed on BiPAP and admitted to stepdown for a CHF exacerbation.  On the second day of admission she spiked a temperature of 101 raising the suspicion of possible underlying pneumonia.  Subjective: Pt is sitting up in the chair. She is asking about when she can go home.   Assessment & Plan:  Acute hypoxic respiratory failure due to acute exacerbation of grade 2 diastolic CHF Net negative approximately 6.9 L thus far - continues to require modestly high level of O2 support - ambulate and follow  Filed Weights   04/01/16 0321 04/01/16 2219 04/02/16 0647  Weight: 118.1 kg (260 lb 6.4 oz) 118.6 kg (261 lb 6.4 oz) 117.8 kg (259 lb 9.6 oz)   FUO - suspected acute bronchitis  Chest x-ray without focal infiltrate but the patient did have right basilar crackles - UA unrevealing - WBC normalized -  empiric antibiotics to stop after 7 days of tx - dry cough persists. Today is day 7/7 of antibiotics  Chronic A. fib with RVR CHA2DS2 - VASc is at least 5 - s/p DCCV 9/29 and presently in NSR - Cards following - on Pradaxa   HTN Blood pressure controlled  Morbid obesity - Body mass index is 41.9 kg/m.  OSA  Hyperlipidemia Continue home medical therapy  DM2 Newly diagnosed in June 2017 - CBGs controlled   DVT prophylaxis: Pradaxa Code Status: FULL CODE Family Communication: spoke w/ husband at bedside  Disposition Plan: transfer to tele - ambulate - follow sats - cont to diurese   Consultants:  [facility] Cardiology + EP  Procedures: none  Antimicrobials:  Ceftriaxone 9/25 > 10/1 Doxycycline 9/26 > 10/1 Levaquin 9/25  Objective: Blood pressure 138/60, pulse 76, temperature 98.3 F (36.8 C),  temperature source Oral, resp. rate 18, height 5\' 6"  (1.676 m), weight 117.8 kg (259 lb 9.6 oz), SpO2 93 %.  Intake/Output Summary (Last 24 hours) at 04/02/16 1549 Last data filed at 04/02/16 1300  Gross per 24 hour  Intake              480 ml  Output             1350 ml  Net             -870 ml   Filed Weights   04/01/16 0321 04/01/16 2219 04/02/16 0647  Weight: 118.1 kg (260 lb 6.4 oz) 118.6 kg (261 lb 6.4 oz) 117.8 kg (259 lb 9.6 oz)    Examination: General: No acute respiratory distress, awake and alert Lungs: Right basilar crackles, no wheezes, equal chest rise Cardiovascular: RRR w/o M or gallup  Abdomen: Nontender, nondistended, soft, bowel sounds positive, no rebound, no ascites, no appreciable mass Extremities: No significant cyanosis, clubbing, or edema bilateral lower extremities  CBC:  Recent Labs Lab 03/26/16 1630 03/26/16 1642 03/28/16 0250 03/31/16 0248 04/01/16 0411  WBC 11.8*  --  11.7* 9.8 10.1  NEUTROABS 7.9*  --   --   --   --   HGB 12.8 13.9 13.1 13.5 13.7  HCT 39.3 41.0 40.3 41.9 43.1  MCV 92.9  --  92.9 92.1 93.3  PLT 248  --  234 354 194   Basic Metabolic Panel:  Recent Labs Lab 03/26/16 2009  03/28/16 0250 03/29/16 0227 03/30/16 0246 03/31/16 0248 04/01/16 0411 04/02/16 0448  NA  --   < > 136 137 135 136 136 138  K  --   < > 4.1 4.5 4.2 4.0 4.3 4.4  CL  --   < > 97* 99* 97* 99* 98* 98*  CO2  --   < > 27 30 29 26 27 31   GLUCOSE  --   < > 141* 137* 139* 171* 113* 120*  BUN  --   < > 15 16 23* 28* 31* 30*  CREATININE  --   < > 0.89 1.01* 0.95 0.99 1.05* 1.03*  CALCIUM  --   < > 9.0 8.9 9.1 9.3 9.0 9.3  MG 2.0  --  2.0  --   --  2.0 2.2  --   < > = values in this interval not displayed. GFR: Estimated Creatinine Clearance: 66.4 mL/min (by C-G formula based on SCr of 1.03 mg/dL (H)).  Cardiac Enzymes:  Recent Labs Lab 03/26/16 2009 03/27/16 0202 03/27/16 0754  TROPONINI 0.04* <0.03 <0.03    HbA1C: Hgb A1c MFr Bld  Date/Time  Value Ref Range Status  12/20/2015 09:28 AM 6.7 (H) 4.6 - 6.5 % Final    Comment:    Glycemic Control Guidelines for People with Diabetes:Non Diabetic:  <6%Goal of Therapy: <7%Additional Action Suggested:  >8%   06/04/2015 08:57 AM 6.5 4.6 - 6.5 % Final    Comment:    Glycemic Control Guidelines for People with Diabetes:Non Diabetic:  <6%Goal of Therapy: <7%Additional Action Suggested:  >8%     CBG:  Recent Labs Lab 04/01/16 1145 04/01/16 1619 04/01/16 2133 04/02/16 0730 04/02/16 1353  GLUCAP 109* 107* 143* 137* 147*    Recent Results (from the past 240 hour(s))  MRSA PCR Screening     Status: None   Collection Time: 03/26/16  9:40 PM  Result Value Ref Range Status   MRSA by PCR NEGATIVE NEGATIVE Final    Comment:        The GeneXpert MRSA Assay (FDA approved for NASAL specimens only), is one component of a comprehensive MRSA colonization surveillance program. It is not intended to diagnose MRSA infection nor to guide or monitor treatment for MRSA infections.   Culture, blood (routine x 2)     Status: None   Collection Time: 03/27/16  3:40 PM  Result Value Ref Range Status   Specimen Description BLOOD RIGHT HAND  Final   Special Requests IN PEDIATRIC BOTTLE 2CC  Final   Culture NO GROWTH 5 DAYS  Final   Report Status 04/01/2016 FINAL  Final  Culture, blood (routine x 2)     Status: None   Collection Time: 03/27/16  3:50 PM  Result Value Ref Range Status   Specimen Description BLOOD LEFT HAND  Final   Special Requests IN PEDIATRIC BOTTLE .[place]  Final   Culture NO GROWTH 5 DAYS  Final   Report Status 04/01/2016 FINAL  Final  Urine culture     Status: None   Collection Time: 03/28/16 10:50 AM  Result Value Ref Range Status   Specimen Description URINE, RANDOM  Final   Special Requests NONE  Final   Culture NO GROWTH  Final   Report Status 03/29/2016 FINAL  Final     Scheduled Meds: . aspirin EC  81 mg Oral QHS  . atorvastatin  40 mg Oral Daily  . cefTRIAXone  (ROCEPHIN)  IV  1 g Intravenous Q24H  . dabigatran  150 mg Oral Q12H  . diltiazem  180 mg Oral Daily  . dofetilide  250 mcg Oral BID  . doxycycline  100 mg Oral Q12H  . FLUoxetine  20 mg Oral q morning - 10a  . furosemide  60 mg Intravenous BID  . insulin aspart  0-5 Units Subcutaneous QHS  . insulin aspart  0-9 Units Subcutaneous TID WC  . losartan  50 mg Oral Daily  . metoprolol succinate  100 mg Oral BID  . multivitamin with minerals  1 tablet Oral Daily  . potassium chloride SA  20 mEq Oral BID    LOS: 7 days   [name] [facility] Office  [phone] Pager - Text Page per [name] as per below:  On-Call/Text Page:      [name]      password [email or web address]  If 7PM-7AM, please contact night-coverage [email or web address] Password [email or web address] 04/02/2016, 3:49 PM

## 2016-04-02 NOTE — Progress Notes (Signed)
Patient places self on CPAP. RT will assist if needed.

## 2016-04-02 NOTE — Evaluation (Signed)
Physical Therapy Evaluation Patient Details Name: [name] MRN: [id number] DOB: 1946/02/23 Today's Date: 04/02/2016   History of Present Illness  70 y.o. female with history of CHF, A.Fib on Tykosin, and pacemaker who presented to the ED for CHF exacerbation and possible PNA.  Clinical Impression  Patient seen for mobility assessment s/p CHF exacerbation. Patient mobilizing well, ambulated on room air with saturations dropping to 89% but improved immediately with pursed lip breathing to 93%. Reapplied 2.5 liters [place] with saturations 99%. At this time, patient independent with mobility and demonstrates no further acute PT needs, encouraged continued mobility during hospital course. Will sign off.    Follow Up Recommendations No PT follow up    Equipment Recommendations  None recommended by PT    Recommendations for Other Services       Precautions / Restrictions Precautions Precaution Comments: watch O2 saturations Restrictions Weight Bearing Restrictions: No      Mobility  Bed Mobility Overal bed mobility: Independent                Transfers Overall transfer level: Independent Equipment used: None                Ambulation/Gait Ambulation/Gait assistance: Independent Ambulation Distance (Feet): 210 Feet Assistive device: None Gait Pattern/deviations: WFL(Within Functional Limits)     General Gait Details: steady and stable gait, desaturation to 89% on room air but improved to 93% with pursed lip breathing  Stairs            Wheelchair Mobility    Modified Rankin (Stroke Patients Only)       Balance Overall balance assessment: Independent                                           Pertinent Vitals/Pain Pain Assessment: No/denies pain    Home Living Family/patient expects to be discharged to:: Private residence Living Arrangements: Spouse/significant other Available Help at Discharge: Family Type of Home: House Home  Access: Stairs to enter   Technical brewer of Steps: 2 Home Layout: Two level;Able to live on main level with bedroom/bathroom Home Equipment: Kasandra Knudsen - single point      Prior Function Level of Independence: Independent with assistive device(s)               Hand Dominance   Dominant Hand: Right    Extremity/Trunk Assessment   Upper Extremity Assessment: Overall WFL for tasks assessed           Lower Extremity Assessment: Overall WFL for tasks assessed         Communication   Communication: No difficulties  Cognition Arousal/Alertness: Awake/alert Behavior During Therapy: WFL for tasks assessed/performed Overall Cognitive Status: Within Functional Limits for tasks assessed                      General Comments      Exercises     Assessment/Plan    PT Assessment Patent does not need any further PT services  PT Problem List            PT Treatment Interventions      PT Goals (Current goals can be found in the Care Plan section)  Acute Rehab PT Goals Patient Stated Goal: to go home PT Goal Formulation: All assessment and education complete, DC therapy    Frequency  Barriers to discharge        Co-evaluation               End of Session Equipment Utilized During Treatment: Oxygen (replaced 2.5 liters with saturations 99%) Activity Tolerance: Patient tolerated treatment well Patient left: in chair;with call bell/phone within reach Nurse Communication: Mobility status         Time: 5868-2574 PT Time Calculation (min) (ACUTE ONLY): 18 min   Charges:   PT Evaluation $PT Eval Low Complexity: 1 Procedure     PT G CodesDuncan Dull 04-22-16, 8:58 AM Alben Deeds, PT DPT  [phone]

## 2016-04-03 DIAGNOSIS — I5031 Acute diastolic (congestive) heart failure: Secondary | ICD-10-CM | POA: Diagnosis not present

## 2016-04-03 LAB — GLUCOSE, CAPILLARY
Glucose-Capillary: 120 mg/dL — ABNORMAL HIGH (ref 65–99)
Glucose-Capillary: 121 mg/dL — ABNORMAL HIGH (ref 65–99)
Glucose-Capillary: 128 mg/dL — ABNORMAL HIGH (ref 65–99)
Glucose-Capillary: 117 mg/dL — ABNORMAL HIGH (ref 65–99)

## 2016-04-03 MED FILL — Midazolam HCl Inj 2 MG/2ML (Base Equivalent): INTRAMUSCULAR | Qty: 4 | Status: AC

## 2016-04-03 NOTE — Progress Notes (Signed)
Patient Name: [name] Date of Encounter: 04/03/2016  Primary Cardiologist: Dr. Arnette Norris Nahser/ [facility] Problem List     Principal Problem:   Acute diastolic heart failure [facility]) Active Problems:   Acute on chronic diastolic congestive heart failure (HCC)   Atrial fibrillation with RVR (HCC)   Acute respiratory failure with hypoxia (HCC)   Chronic atrial fibrillation (HCC)   HLD (hyperlipidemia)   Controlled type 2 diabetes mellitus without complication (HCC)     Subjective   Improved. No CP, reduced SOB. [employer].   Inpatient Medications    Scheduled Meds: . aspirin EC  81 mg Oral QHS  . atorvastatin  40 mg Oral Daily  . dabigatran  150 mg Oral Q12H  . diltiazem  180 mg Oral Daily  . dofetilide  250 mcg Oral BID  . FLUoxetine  20 mg Oral q morning - 10a  . furosemide  60 mg Intravenous BID  . insulin aspart  0-5 Units Subcutaneous QHS  . insulin aspart  0-9 Units Subcutaneous TID WC  . losartan  50 mg Oral Daily  . metoprolol succinate  100 mg Oral BID  . multivitamin with minerals  1 tablet Oral Daily  . potassium chloride SA  20 mEq Oral BID   Continuous Infusions:  PRN Meds:.acetaminophen, benzonatate, levalbuterol, metoprolol, ondansetron (ZOFRAN) IV, Polyethyl Glycol-Propyl Glycol   Vital Signs    Vitals:   04/02/16 1131 04/02/16 2010 04/03/16 0522 04/03/16 0820  BP: 138/60 130/60 (!) 113/45   Pulse: 76 88 66   Resp: 18 18 16    Temp: 98.3 F (36.8 C) 99.6 F (37.6 C) 98.2 F (36.8 C)   TempSrc: Oral Oral Oral   SpO2: 93% 93% 92%   Weight:    259 lb (117.5 kg)  Height:        Intake/Output Summary (Last 24 hours) at 04/03/16 1029 Last data filed at 04/03/16 0931  Gross per 24 hour  Intake             1440 ml  Output             1700 ml  Net             -260 ml   Filed Weights   04/01/16 2219 04/02/16 0647 04/03/16 0820  Weight: 261 lb 6.4 oz (118.6 kg) 259 lb 9.6 oz (117.8 kg) 259 lb (117.5 kg)    Physical Exam    GEN:  Well nourished, well developed, in no acute distress.  HEENT: Grossly normal.  Neck: Supple, no JVD, carotid bruits, or masses. Cardiac: RRR, no murmurs, rubs, or gallops. No clubbing, cyanosis, edema.  Radials/DP/PT 2+ and equal bilaterally.  Respiratory:  Respirations regular and unlabored, clear to auscultation bilaterally. GI: Soft, nontender, nondistended, BS + x 4. obese MS: no deformity or atrophy. Skin: warm and dry, no rash. Neuro:  Strength and sensation are intact. Psych: AAOx3.  Normal affect.  Labs    CBC  Recent Labs  04/01/16 0411  WBC 10.1  HGB 13.7  HCT 43.1  MCV 93.3  PLT 409   Basic Metabolic Panel  Recent Labs  04/01/16 0411 04/02/16 0448  NA 136 138  K 4.3 4.4  CL 98* 98*  CO2 27 31  GLUCOSE 113* 120*  BUN 31* 30*  CREATININE 1.05* 1.03*  CALCIUM 9.0 9.3  MG 2.2  --    Liver Function Tests  Recent Labs  04/01/16 0411  AST 33  ALT 53  ALKPHOS 153*  BILITOT 0.7  PROT 6.3*  ALBUMIN 2.7*     Telemetry     NSR - Personally Reviewed  ECG     NSR - Personally Reviewed  Radiology    No results found.   Cardiac Studies   Echocardiogram 03/28/2016: Study Conclusions  - Left ventricle: The cavity size was normal. Wall thickness was increased in a pattern of mild LVH. Systolic function was normal. The estimated ejection fraction was in the range of 55% to 60%. Indeterminant diastolic function (atrial fibrillation). Wall motion was normal; there were no regional wall motion abnormalities. - Aortic valve: There was no stenosis. - Mitral valve: Mildly calcified annulus. There was no significant regurgitation. - Left atrium: The atrium was mildly dilated. - Right ventricle: The cavity size was normal. Pacer wire or catheter noted in right ventricle. Systolic function was normal. - Tricuspid valve: Peak RV-RA gradient (S): 22 mm Hg. - Pulmonary arteries: PA peak pressure: 30 mm Hg (S). - Systemic veins: IVC  measured 2.3 cm with normal respirophasic variation, suggesting RA pressure 8 mmHg.  Impressions:  - The patient was in atrial fibrillation with rapid ventricular response. Normal LV size with mild LV hypertrophy, EF 55-60%. .Normal RV size and systolic function. No significant valvular abnormalities.  Patient Profile     70 y/o femalewith coronary artery disease, chronic diastolic dysfunction, stroke, paroxysmal atrial fibrillation on Tikosyn and Pradaxa,symptomatic tachycardia bradycardia syndrome, status post permanent pacemaker insertion and OSA compliant with CPAP, admitted for hypoxic respiratory failure, in the setting of a/c CHF and respiratory infection. ? Flu vs PNA.  Assessment & Plan    AFIB parox  - CHA2DS2 - VASc score of 6.  Status post DCCV 9/29.   Continue current therapy with Tikosyn and anticoagulation.  Pacer  - [employer] PPM, followed by Dr. Lovena Le.  - stable  Acute on chronic diastolic heart failure.   - LVEF 69-79% (grade 2 diastolic dysfunction). Has diuresed about 6600 cc net so far. Clinically improved.   - lasix 60 IV BID  - BUN creat stable.   - Continue diuresis  Acute hypoxic RF  - ? Bronchitis as well as HF  - 7 days empiric tx  Morbid obesity  - weight loss  OSA  - no changes.   Let's give her one more day of IV lasix. Tomorrow hopeful DC.   Signed, Candee Furbish, MD  04/03/2016, 10:29 AM

## 2016-04-03 NOTE — Progress Notes (Signed)
[name]  WLN:[id number] DOB: 12/15/1945 DOA: 03/26/2016 PCP: Lottie Dawson, MD    Brief Narrative:  70 y.o.femalewith history of CHF, A.Fib on Tykosin, and pacemaker who presented to the ED with SOB which was worse with activity or laying down, better when sitting upright. She was placed on BiPAP and admitted to stepdown for a CHF exacerbation.  On the second day of admission she spiked a temperature of 101 raising the suspicion of possible underlying pneumonia.  Subjective: Pt has no new complaints today. She is inquiring about discharge planning  Assessment & Plan:  Acute hypoxic respiratory failure due to acute exacerbation of grade 2 diastolic CHF Net negative approximately 6.9 L thus far - continues to require modestly high level of O2 support - ambulate and follow  Filed Weights   04/01/16 2219 04/02/16 0647 04/03/16 0820  Weight: 118.6 kg (261 lb 6.4 oz) 117.8 kg (259 lb 9.6 oz) 117.5 kg (259 lb)   FUO - suspected acute bronchitis  Chest x-ray without focal infiltrate but the patient did have right basilar crackles - UA unrevealing - WBC normalized -  empiric antibiotics to stop after 7 days of tx - dry cough persists. Today is day 7/7 of antibiotics  Chronic A. fib with RVR CHA2DS2 - VASc is at least 5 - s/p DCCV 9/29 and presently in NSR - Cards following - on Pradaxa   HTN Blood pressure controlled  Morbid obesity - Body mass index is 41.8 kg/m.  OSA  Hyperlipidemia Continue home medical therapy  DM2 Newly diagnosed in June 2017 - CBGs controlled   DVT prophylaxis: Pradaxa Code Status: FULL CODE Family Communication: spoke w/ husband at bedside  Disposition Plan: transfer to tele - ambulate - follow sats - cont to diurese   Consultants:  [facility] Cardiology + EP  Procedures: none  Antimicrobials:  Ceftriaxone 9/25 > 10/1 Doxycycline 9/26 > 10/1 Levaquin 9/25  Objective: Blood pressure (!) 113/45, pulse 66, temperature 98.2 F (36.8 C),  temperature source Oral, resp. rate 16, height 5\' 6"  (1.676 m), weight 117.5 kg (259 lb), SpO2 92 %.  Intake/Output Summary (Last 24 hours) at 04/03/16 1446 Last data filed at 04/03/16 1354  Gross per 24 hour  Intake             1640 ml  Output             1600 ml  Net               40 ml   Filed Weights   04/01/16 2219 04/02/16 0647 04/03/16 0820  Weight: 118.6 kg (261 lb 6.4 oz) 117.8 kg (259 lb 9.6 oz) 117.5 kg (259 lb)    Examination: General: No acute respiratory distress, awake and alert Lungs: Right basilar crackles, no wheezes, equal chest rise Cardiovascular: RRR w/o M or gallup  Abdomen: Nontender, nondistended, soft, bowel sounds positive, no rebound, no ascites, no appreciable mass Extremities: No significant cyanosis, clubbing, or edema bilateral lower extremities  CBC:  Recent Labs Lab 03/28/16 0250 03/31/16 0248 04/01/16 0411  WBC 11.7* 9.8 10.1  HGB 13.1 13.5 13.7  HCT 40.3 41.9 43.1  MCV 92.9 92.1 93.3  PLT 234 354 740   Basic Metabolic Panel:  Recent Labs Lab 03/28/16 0250 03/29/16 0227 03/30/16 0246 03/31/16 0248 04/01/16 0411 04/02/16 0448  NA 136 137 135 136 136 138  K 4.1 4.5 4.2 4.0 4.3 4.4  CL 97* 99* 97* 99* 98* 98*  CO2 27 30 29  26 27 31   GLUCOSE 141* 137* 139* 171* 113* 120*  BUN 15 16 23* 28* 31* 30*  CREATININE 0.89 1.01* 0.95 0.99 1.05* 1.03*  CALCIUM 9.0 8.9 9.1 9.3 9.0 9.3  MG 2.0  --   --  2.0 2.2  --    GFR: Estimated Creatinine Clearance: 66.3 mL/min (by C-G formula based on SCr of 1.03 mg/dL (H)).  Cardiac Enzymes: No results for input(s): CKTOTAL, CKMB, CKMBINDEX, TROPONINI in the last 168 hours.  HbA1C: Hgb A1c MFr Bld  Date/Time Value Ref Range Status  12/20/2015 09:28 AM 6.7 (H) 4.6 - 6.5 % Final    Comment:    Glycemic Control Guidelines for People with Diabetes:Non Diabetic:  <6%Goal of Therapy: <7%Additional Action Suggested:  >8%   06/04/2015 08:57 AM 6.5 4.6 - 6.5 % Final    Comment:    Glycemic Control  Guidelines for People with Diabetes:Non Diabetic:  <6%Goal of Therapy: <7%Additional Action Suggested:  >8%     CBG:  Recent Labs Lab 04/02/16 1353 04/02/16 1616 04/02/16 2102 04/03/16 0549 04/03/16 1206  GLUCAP 147* 123* 120* 120* 121*    Recent Results (from the past 240 hour(s))  MRSA PCR Screening     Status: None   Collection Time: 03/26/16  9:40 PM  Result Value Ref Range Status   MRSA by PCR NEGATIVE NEGATIVE Final    Comment:        The GeneXpert MRSA Assay (FDA approved for NASAL specimens only), is one component of a comprehensive MRSA colonization surveillance program. It is not intended to diagnose MRSA infection nor to guide or monitor treatment for MRSA infections.   Culture, blood (routine x 2)     Status: None   Collection Time: 03/27/16  3:40 PM  Result Value Ref Range Status   Specimen Description BLOOD RIGHT HAND  Final   Special Requests IN PEDIATRIC BOTTLE 2CC  Final   Culture NO GROWTH 5 DAYS  Final   Report Status 04/01/2016 FINAL  Final  Culture, blood (routine x 2)     Status: None   Collection Time: 03/27/16  3:50 PM  Result Value Ref Range Status   Specimen Description BLOOD LEFT HAND  Final   Special Requests IN PEDIATRIC BOTTLE .[place]  Final   Culture NO GROWTH 5 DAYS  Final   Report Status 04/01/2016 FINAL  Final  Urine culture     Status: None   Collection Time: 03/28/16 10:50 AM  Result Value Ref Range Status   Specimen Description URINE, RANDOM  Final   Special Requests NONE  Final   Culture NO GROWTH  Final   Report Status 03/29/2016 FINAL  Final     Scheduled Meds: . aspirin EC  81 mg Oral QHS  . atorvastatin  40 mg Oral Daily  . dabigatran  150 mg Oral Q12H  . diltiazem  180 mg Oral Daily  . dofetilide  250 mcg Oral BID  . FLUoxetine  20 mg Oral q morning - 10a  . furosemide  60 mg Intravenous BID  . insulin aspart  0-5 Units Subcutaneous QHS  . insulin aspart  0-9 Units Subcutaneous TID WC  . losartan  50 mg Oral Daily   . metoprolol succinate  100 mg Oral BID  . multivitamin with minerals  1 tablet Oral Daily  . potassium chloride SA  20 mEq Oral BID    LOS: 8 days   [name] [facility] Office  [phone] Pager - Text Page  per Amion as per below:  On-Call/Text Page:      [name]      password [email or web address]  If 7PM-7AM, please contact night-coverage [email or web address] Password [email or web address] 04/03/2016, 2:46 PM

## 2016-04-03 NOTE — Care Management Important Message (Signed)
Important Message  Patient Details  Name: MORGYN MARUT MRN: [id number] Date of Birth: 07-Jan-1946   Medicare Important Message Given:  Yes    Darcel Zick 04/03/2016, 1:07 PM

## 2016-04-04 ENCOUNTER — Other Ambulatory Visit: Payer: Self-pay

## 2016-04-04 DIAGNOSIS — I5031 Acute diastolic (congestive) heart failure: Secondary | ICD-10-CM | POA: Diagnosis not present

## 2016-04-04 LAB — GLUCOSE, CAPILLARY
Glucose-Capillary: 133 mg/dL — ABNORMAL HIGH (ref 65–99)
Glucose-Capillary: 117 mg/dL — ABNORMAL HIGH (ref 65–99)

## 2016-04-04 MED ORDER — FUROSEMIDE 40 MG PO TABS: 40.0000 mg | ORAL_TABLET | Freq: Every day | ORAL | 0 refills | Status: DC

## 2016-04-04 MED ORDER — METOPROLOL SUCCINATE ER 100 MG PO TB24: 100.0000 mg | ORAL_TABLET | Freq: Two times a day (BID) | ORAL | 0 refills | Status: DC

## 2016-04-04 MED ORDER — BENZONATATE 200 MG PO CAPS: 200.0000 mg | ORAL_CAPSULE | Freq: Three times a day (TID) | ORAL | 0 refills | Status: DC | PRN

## 2016-04-04 NOTE — Discharge Summary (Signed)
Physician Discharge Summary  [name] [id number] DOB: February 24, 1946 DOA: 03/26/2016  PCP: Lottie Dawson, MD  Admit date: 03/26/2016 Discharge date: 04/04/2016  Time spent: 35 minutes  Recommendations for Outpatient Follow-up:  Monitor blood pressures and adjust antihypertensives accordingly Ensure patient follows up with cardiologist after discharge Patient completed treatment of pneumonia with 7 days of antibiotics while in house  Discharge Diagnoses:  Principal Problem:   Acute diastolic heart failure ([place]) Active Problems:   Acute on chronic diastolic congestive heart failure (HCC)   Atrial fibrillation with RVR ([place])   Acute respiratory failure with hypoxia (HCC)   Chronic atrial fibrillation (HCC)   HLD (hyperlipidemia)   Controlled type 2 diabetes mellitus without complication [facility])   Discharge Condition: Stable  Diet recommendation: Heart healthy/carb modified  Filed Weights   04/02/16 0647 04/03/16 0820 04/04/16 0523  Weight: 117.8 kg (259 lb 9.6 oz) 117.5 kg (259 lb) 117.2 kg (258 lb 6.4 oz)    History of present illness:  70 y.o.femalewith history of CHF, A.Fib on Tykosin, and pacemaker who presented to the ED with SOB which was worse with activity or laying down, better when sitting upright. She was placed on BiPAP and admitted to stepdown for a CHF exacerbation.  On the second day of admission she spiked a temperature of 101 raising the suspicion of possible underlying pneumonia.  Hospital Course:   Acute hypoxic respiratory failure due to acute exacerbation of grade 2 diastolic CHF Resolved and breathing comfortably on room air. Improved on Lasix  FUO - suspected acute bronchitis  Chest x-ray without focal infiltrate but the patient did have right basilar crackles - UA unrevealing - WBC normalized -  empiric antibiotics to stop after 7 days of tx - dry cough persists. Completed  7/7 of antibiotics  Chronic A. fib with RVR CHA2DS2 - VASc is at  least 5 - s/p DCCV 9/29 and presently in NSR - Cards following - on Pradaxa   HTN Discharge on regimen listed below. Stable.  Morbid obesity - Body mass index is 41.8 kg/m.  OSA  Hyperlipidemia Continue home medical therapy  DM2 Newly diagnosed in June 2017 - diabetic diet  Procedures:  None  Consultations:  Cardiology   Discharge Exam: Vitals:   04/04/16 0846 04/04/16 1221  BP: (!) 119/51 (!) 94/49  Pulse: 69 64  Resp: 18 18  Temp: 98.7 F (37.1 C) 99 F (37.2 C)    General: Pt in nad, alert and awake Cardiovascular: s1 and s2 present, no rubs Respiratory: no increased wob, no wheezes, speaking in full sentences  Discharge Instructions   Discharge Instructions    AMB Referral to [place] Management    Complete by:  As directed    Reason for consult:  HTA patient LOS, post hospital follow up   Diagnoses of:   Heart Failure COPD/ Pneumonia     Expected date of contact:  1-3 days (reserved for hospital discharges)   Please assign to community nurse for transition of care calls and assess for home visits. PLEASE CALL HUSBAND's CELL PHONE NUMBER FOR BEST CONTACT  [name] [phone].   Questions please call:   [name], RN BSN [facility] Liaison  [phone] business mobile phone Toll free office [phone]   Call MD for:  difficulty breathing, headache or visual disturbances    Complete by:  As directed    Call MD for:  temperature >100.4    Complete by:  As directed  Diet - low sodium heart healthy    Complete by:  As directed    Discharge instructions    Complete by:  As directed    Pt has no new complaints. No acute issues overnight.   Increase activity slowly    Complete by:  As directed      Current Discharge Medication List    START taking these medications   Details  benzonatate (TESSALON) 200 MG capsule Take 1 capsule (200 mg total) by mouth 3 (three) times daily as needed for cough. Qty: 20  capsule, Refills: 0      CONTINUE these medications which have CHANGED   Details  furosemide (LASIX) 40 MG tablet Take 1 tablet (40 mg total) by mouth daily. Qty: 30 tablet, Refills: 0    metoprolol succinate (TOPROL-XL) 100 MG 24 hr tablet Take 1 tablet (100 mg total) by mouth 2 (two) times daily. Take with or immediately following a meal. Qty: 60 tablet, Refills: 0      CONTINUE these medications which have NOT CHANGED   Details  aspirin EC 81 MG tablet Take 81 mg by mouth at bedtime.     atorvastatin (LIPITOR) 40 MG tablet TAKE 1 TABLET BY MOUTH EVERY DAY Qty: 90 tablet, Refills: 2    Cyanocobalamin 2500 MCG TABS Take 2,500 mcg by mouth daily. Vitamin B12    diltiazem (CARDIZEM CD) 180 MG 24 hr capsule TAKE 1 CAPSULE (180 MG TOTAL) BY MOUTH DAILY. Qty: 90 capsule, Refills: 2    dofetilide (TIKOSYN) 250 MCG capsule TAKE 1 CAPSULE (250 MCG TOTAL) BY MOUTH 2 (TWO) TIMES DAILY. Qty: 180 capsule, Refills: 2    FLUoxetine (PROZAC) 20 MG capsule TAKE ONE CAPSULE BY MOUTH EVERY MORNING Qty: 90 capsule, Refills: 0    KLOR-CON M20 20 MEQ tablet TAKE 1 TABLET BY MOUTH TWICE A DAY Qty: 180 tablet, Refills: 3    losartan (COZAAR) 100 MG tablet Take 1 tablet (100 mg total) by mouth daily. Qty: 90 tablet, Refills: 2    Lysine HCl 1000 MG TABS Take 1 tablet by mouth at bedtime.     Multiple Vitamin (MULTIVITAMIN WITH MINERALS) TABS tablet Take 1 tablet by mouth daily. Centrum    nitroGLYCERIN (NITROSTAT) 0.4 MG SL tablet Place 0.4 mg under the tongue every 5 (five) minutes x 3 doses as needed for chest pain.    Polyethyl Glycol-Propyl Glycol (SYSTANE OP) Place 1 drop into both eyes daily as needed (dry eyes).    PRADAXA 150 MG CAPS capsule TAKE 1 CAPSULE BY MOUTH EVERY 12 HOURS Qty: 180 capsule, Refills: 0    PRESCRIPTION MEDICATION Inhale into the lungs at bedtime. CPAP      STOP taking these medications     naproxen sodium (ALEVE) 220 MG tablet        No Known  Allergies    The results of significant diagnostics from this hospitalization (including imaging, microbiology, ancillary and laboratory) are listed below for reference.    Significant Diagnostic Studies: Dg Chest 2 View  Result Date: 03/27/2016 CLINICAL DATA:  Fever for 1 day, dry cough and shortness of breath for 5 days. EXAM: CHEST  2 VIEW COMPARISON:  Chest x-rays dated 03/26/2016 and 03/20/2014. FINDINGS: Mild cardiomegaly is stable. There is stable central pulmonary vascular congestion and mild perihilar edema. No new lung findings. No confluent airspace opacity to suggest a consolidating pneumonia. No pleural effusion or pneumothorax seen. Atherosclerotic changes noted at the aortic arch. Left chest wall pacemaker/ICD in place.  Osseous and soft tissue structures about the chest are otherwise unremarkable. IMPRESSION: 1. Cardiomegaly with central pulmonary vascular congestion suggesting mild CHF. Similar appearance seen on multiple prior chest x-rays suggesting a chronic mild CHF. 2. No new findings.  No evidence of pneumonia. 3. Aortic atherosclerosis. Electronically Signed   By: Franki Cabot M.D.   On: 03/27/2016 18:50   Dg Chest Port 1 View  Result Date: 04/01/2016 CLINICAL DATA:  70 year old female with pulmonary edema. Initial encounter. EXAM: PORTABLE CHEST 1 VIEW COMPARISON:  03/27/2016 and earlier. FINDINGS: Portable AP semi upright view at 0545 hours. Stable cardiomegaly and mediastinal contours. Stable left chest cardiac pacer. Continued and mildly progressed basilar predominant increased interstitial opacity. No pneumothorax or pleural effusion identified. No consolidation. Calcified aortic atherosclerosis. Visualized tracheal air column is within normal limits. IMPRESSION: 1. Mild progression of basilar predominant increased interstitial opacity compatible with interstitial edema. 2. No new cardiopulmonary abnormality. 3.  Calcified aortic atherosclerosis. Electronically Signed   By:  Genevie Ann M.D.   On: 04/01/2016 07:47   Dg Chest Port 1 View  Result Date: 03/26/2016 CLINICAL DATA:  Shortness of breath for 3 days. History of atrial fibrillation. EXAM: PORTABLE CHEST 1 VIEW COMPARISON:  PA and lateral chest 03/21/2014. FINDINGS: There is cardiomegaly and mild vascular congestion. No consolidative process, pneumothorax or effusion. Aortic atherosclerosis is identified. Pacing device is unchanged. IMPRESSION: Cardiomegaly and vascular congestion. Atherosclerosis. Electronically Signed   By: Inge Rise M.D.   On: 03/26/2016 16:48    Microbiology: Recent Results (from the past 240 hour(s))  MRSA PCR Screening     Status: None   Collection Time: 03/26/16  9:40 PM  Result Value Ref Range Status   MRSA by PCR NEGATIVE NEGATIVE Final    Comment:        The GeneXpert MRSA Assay (FDA approved for NASAL specimens only), is one component of a comprehensive MRSA colonization surveillance program. It is not intended to diagnose MRSA infection nor to guide or monitor treatment for MRSA infections.   Culture, blood (routine x 2)     Status: None   Collection Time: 03/27/16  3:40 PM  Result Value Ref Range Status   Specimen Description BLOOD RIGHT HAND  Final   Special Requests IN PEDIATRIC BOTTLE 2CC  Final   Culture NO GROWTH 5 DAYS  Final   Report Status 04/01/2016 FINAL  Final  Culture, blood (routine x 2)     Status: None   Collection Time: 03/27/16  3:50 PM  Result Value Ref Range Status   Specimen Description BLOOD LEFT HAND  Final   Special Requests IN PEDIATRIC BOTTLE .[place]  Final   Culture NO GROWTH 5 DAYS  Final   Report Status 04/01/2016 FINAL  Final  Urine culture     Status: None   Collection Time: 03/28/16 10:50 AM  Result Value Ref Range Status   Specimen Description URINE, RANDOM  Final   Special Requests NONE  Final   Culture NO GROWTH  Final   Report Status 03/29/2016 FINAL  Final     Labs: Basic Metabolic Panel:  Recent Labs Lab  03/29/16 0227 03/30/16 0246 03/31/16 0248 04/01/16 0411 04/02/16 0448  NA 137 135 136 136 138  K 4.5 4.2 4.0 4.3 4.4  CL 99* 97* 99* 98* 98*  CO2 30 29 26 27 31   GLUCOSE 137* 139* 171* 113* 120*  BUN 16 23* 28* 31* 30*  CREATININE 1.01* 0.95 0.99 1.05* 1.03*  CALCIUM 8.9 9.1  9.3 9.0 9.3  MG  --   --  2.0 2.2  --    Liver Function Tests:  Recent Labs Lab 04/01/16 0411  AST 33  ALT 53  ALKPHOS 153*  BILITOT 0.7  PROT 6.3*  ALBUMIN 2.7*   No results for input(s): LIPASE, AMYLASE in the last 168 hours. No results for input(s): AMMONIA in the last 168 hours. CBC:  Recent Labs Lab 03/31/16 0248 04/01/16 0411  WBC 9.8 10.1  HGB 13.5 13.7  HCT 41.9 43.1  MCV 92.1 93.3  PLT 354 340   Cardiac Enzymes: No results for input(s): CKTOTAL, CKMB, CKMBINDEX, TROPONINI in the last 168 hours. BNP: BNP (last 3 results)  Recent Labs  03/26/16 1630  BNP 553.9*    ProBNP (last 3 results) No results for input(s): PROBNP in the last 8760 hours.  CBG:  Recent Labs Lab 04/03/16 1206 04/03/16 1637 04/03/16 2057 04/04/16 0608 04/04/16 1121  GLUCAP 121* 128* 117* 133* 117*    Signed:  [name] MD.  [facility] 04/04/2016, 3:17 PM

## 2016-04-04 NOTE — Progress Notes (Signed)
Patient Name: [name] Date of Encounter: 04/04/2016  Primary Cardiologist: Dr. Arnette Norris Nahser/ [facility] Problem List     Principal Problem:   Acute diastolic heart failure [facility]) Active Problems:   Acute on chronic diastolic congestive heart failure (HCC)   Atrial fibrillation with RVR (HCC)   Acute respiratory failure with hypoxia (HCC)   Chronic atrial fibrillation (HCC)   HLD (hyperlipidemia)   Controlled type 2 diabetes mellitus without complication (HCC)     Subjective   Improved. No CP, no SOB. Walked halls again, no O2.   Inpatient Medications    Scheduled Meds: . aspirin EC  81 mg Oral QHS  . atorvastatin  40 mg Oral Daily  . dabigatran  150 mg Oral Q12H  . diltiazem  180 mg Oral Daily  . dofetilide  250 mcg Oral BID  . FLUoxetine  20 mg Oral q morning - 10a  . furosemide  60 mg Intravenous BID  . insulin aspart  0-5 Units Subcutaneous QHS  . insulin aspart  0-9 Units Subcutaneous TID WC  . losartan  50 mg Oral Daily  . metoprolol succinate  100 mg Oral BID  . multivitamin with minerals  1 tablet Oral Daily  . potassium chloride SA  20 mEq Oral BID   Continuous Infusions:  PRN Meds:.acetaminophen, benzonatate, levalbuterol, metoprolol, ondansetron (ZOFRAN) IV, Polyethyl Glycol-Propyl Glycol   Vital Signs    Vitals:   04/03/16 2007 04/03/16 2252 04/04/16 0523 04/04/16 0846  BP: 128/69 (!) 107/41 107/61 (!) 119/51  Pulse: 70 64 65 69  Resp: 17  18 18   Temp: 98.6 F (37 C)  98 F (36.7 C) 98.7 F (37.1 C)  TempSrc: Oral  Oral Oral  SpO2: 94%  94% 95%  Weight:   258 lb 6.4 oz (117.2 kg)   Height:        Intake/Output Summary (Last 24 hours) at 04/04/16 1017 Last data filed at 04/04/16 0844  Gross per 24 hour  Intake             1380 ml  Output             1950 ml  Net             -570 ml   Filed Weights   04/02/16 0647 04/03/16 0820 04/04/16 0523  Weight: 259 lb 9.6 oz (117.8 kg) 259 lb (117.5 kg) 258 lb 6.4 oz (117.2 kg)     Physical Exam    GEN: Well nourished, well developed, in no acute distress.  HEENT: Grossly normal.  Neck: Supple, no JVD, carotid bruits, or masses. Cardiac: RRR, no murmurs, rubs, or gallops. No clubbing, cyanosis, edema.  Radials/DP/PT 2+ and equal bilaterally.  Respiratory:  Respirations regular and unlabored, clear to auscultation bilaterally. GI: Soft, nontender, nondistended, BS + x 4. obese MS: no deformity or atrophy. Skin: warm and dry, no rash. Neuro:  Strength and sensation are intact. Psych: AAOx3.  Normal affect.  Labs    CBC No results for input(s): WBC, NEUTROABS, HGB, HCT, MCV, PLT in the last 72 hours. Basic Metabolic Panel  Recent Labs  04/02/16 0448  NA 138  K 4.4  CL 98*  CO2 31  GLUCOSE 120*  BUN 30*  CREATININE 1.03*  CALCIUM 9.3   Liver Function Tests No results for input(s): AST, ALT, ALKPHOS, BILITOT, PROT, ALBUMIN in the last 72 hours.   Telemetry     NSR - Personally Reviewed  ECG  NSR - Personally Reviewed  Radiology    No results found.   Cardiac Studies   Echocardiogram 03/28/2016: Study Conclusions  - Left ventricle: The cavity size was normal. Wall thickness was increased in a pattern of mild LVH. Systolic function was normal. The estimated ejection fraction was in the range of 55% to 60%. Indeterminant diastolic function (atrial fibrillation). Wall motion was normal; there were no regional wall motion abnormalities. - Aortic valve: There was no stenosis. - Mitral valve: Mildly calcified annulus. There was no significant regurgitation. - Left atrium: The atrium was mildly dilated. - Right ventricle: The cavity size was normal. Pacer wire or catheter noted in right ventricle. Systolic function was normal. - Tricuspid valve: Peak RV-RA gradient (S): 22 mm Hg. - Pulmonary arteries: PA peak pressure: 30 mm Hg (S). - Systemic veins: IVC measured 2.3 cm with normal respirophasic variation,  suggesting RA pressure 8 mmHg.  Impressions:  - The patient was in atrial fibrillation with rapid ventricular response. Normal LV size with mild LV hypertrophy, EF 55-60%. .Normal RV size and systolic function. No significant valvular abnormalities.  Patient Profile     70 y/o femalewith coronary artery disease, chronic diastolic dysfunction, stroke, paroxysmal atrial fibrillation on Tikosyn and Pradaxa,symptomatic tachycardia bradycardia syndrome, status post permanent pacemaker insertion and OSA compliant with CPAP, admitted for hypoxic respiratory failure, in the setting of a/c CHF and respiratory infection. ? Flu vs PNA.  Assessment & Plan    AFIB parox  - CHA2DS2 - VASc score of 6.  Status post DCCV 9/29.   Continue current therapy with Tikosyn and anticoagulation.  Pacer  - St. Jude PPM, followed by Dr. Lovena Le.  - stable  Acute on chronic diastolic heart failure.   - LVEF 63-84% (grade 2 diastolic dysfunction). Has diuresed about 6600 cc net so far. Clinically improved.   - lasix 60 IV BID  - At home was taking lasix 20 BID. I recommend 40 PO QD. Daily weights and if > 2 lbs, take extra lasix.   - BUN creat stable.    Acute hypoxic RF  - ? Bronchitis as well as HF  - 7 days empiric tx  Morbid obesity  - weight loss  OSA  - no changes.   OK to DC from my perspective   Signed, Candee Furbish, MD  04/04/2016, 10:17 AM

## 2016-04-04 NOTE — Progress Notes (Signed)
Pt discharge education and instructions completed with pt at bedside; pt voices understanding and denies any questions. Pt IV and telemetry removed; pt discharge home with family to transport her home. Pt handed her prescription for Lopressor; pt transported off unit via wheelchair with belongings to the side. Delia Heady RN

## 2016-04-04 NOTE — Consult Note (Signed)
   [facility] CM Inpatient Consult   04/04/2016  ROSELENE GRAY 04-20-1946 [id number]   Patient evaluated for community based chronic disease management services with [facility] as a benefit of patient's [employer]  Medicare Insurance. Spoke with patient at bedside to explain [facility] services. Patient endorses Dr. [name] as her primary care provider.  Consent form signed.  Patient will receive post hospital discharge call and will be evaluated for monthly home visits for assessments and disease process education.  Left contact information and [facility] literature at bedside. Made Inpatient Case Manager aware that [facility] following. Of note, [facility] Care Management services does not replace or interfere with any services that are arranged by inpatient case management or social work.  For additional questions or referrals please contact:    [name], RN BSN [facility] Liaison  [phone] business mobile phone Toll free office [phone]

## 2016-04-05 ENCOUNTER — Ambulatory Visit (INDEPENDENT_AMBULATORY_CARE_PROVIDER_SITE_OTHER): Payer: PPO | Admitting: Internal Medicine

## 2016-04-05 ENCOUNTER — Ambulatory Visit (INDEPENDENT_AMBULATORY_CARE_PROVIDER_SITE_OTHER)
Admission: RE | Admit: 2016-04-05 | Discharge: 2016-04-05 | Disposition: A | Discharge status: Home / Self Care | Payer: PPO | Source: Ambulatory Visit | Attending: Internal Medicine | Admitting: Internal Medicine

## 2016-04-05 ENCOUNTER — Encounter: Payer: Self-pay | Admitting: Internal Medicine

## 2016-04-05 VITALS — BP 120/74 | HR 75 | Temp 98.4°F | Ht 66.0 in | Wt 260.4 lb

## 2016-04-05 DIAGNOSIS — R05 Cough: Secondary | ICD-10-CM

## 2016-04-05 DIAGNOSIS — R059 Cough, unspecified: Secondary | ICD-10-CM

## 2016-04-05 DIAGNOSIS — Z8701 Personal history of pneumonia (recurrent): Secondary | ICD-10-CM

## 2016-04-05 DIAGNOSIS — L299 Pruritus, unspecified: Secondary | ICD-10-CM | POA: Diagnosis not present

## 2016-04-05 DIAGNOSIS — R7303 Prediabetes: Secondary | ICD-10-CM

## 2016-04-05 DIAGNOSIS — I509 Heart failure, unspecified: Secondary | ICD-10-CM | POA: Diagnosis not present

## 2016-04-05 DIAGNOSIS — R21 Rash and other nonspecific skin eruption: Secondary | ICD-10-CM

## 2016-04-05 DIAGNOSIS — L231 Allergic contact dermatitis due to adhesives: Secondary | ICD-10-CM

## 2016-04-05 MED ORDER — TRIAMCINOLONE ACETONIDE 0.1 % EX CREA: 1.0000 "application " | TOPICAL_CREAM | Freq: Two times a day (BID) | CUTANEOUS | 0 refills | Status: DC

## 2016-04-05 MED ORDER — FLUCONAZOLE 150 MG PO TABS: 150.0000 mg | ORAL_TABLET | Freq: Once | ORAL | 0 refills | Status: AC

## 2016-04-05 MED ORDER — PREDNISONE 20 MG PO TABS: ORAL_TABLET | ORAL | 0 refills | Status: DC

## 2016-04-05 NOTE — Progress Notes (Signed)
Chief Complaint  Patient presents with  . Rash    HPI: [name] 70 y.o.   Acute sda appt for "yeast " but    Actually complaining of a rash on her legs and beginning to itch all over a day after getting out of the hospital for acute on chronic CHF acute bronchitis treated for pneumonia and respiratory decompensation.. rash all over a nd hjust got out of hospt for acut on chronic chf and pna  For 9 days  dced 10 2 17       No change in med x  Given dec dose of lasix at home  she thinks supposed to follow-up with cardiology but doesn't have an appointment. She does have an appointment with me for post hospital in a week or 2. Has a cough when she lays down and may be a nagging cough when sitting but no increasing shortness of breath. There is no swelling or itching in her throat. She does have a rash around a number of the EKG lead pads etc. She was cardioverted in the hospital apparently did okay. Antibiotics were finished when she was in the hospital..    ROS: See pertinent positives and negatives per HPI.  Past Medical History:  Diagnosis Date  . Anticoagulant long-term use    pradaxa  . Anxiety   . Arthritis    low back & both knees, Spondylolisthesis    . CAD (coronary artery disease) 8119,1478   post PTCA with bare-metal stenting to mid RCA in December 2004     . CHF (congestive heart failure) ([place])   . Chronic atrial fibrillation ([place]) 06/2007   Tachybradycardia pacemaker  . Chronic kidney disease    10% function - ?R, other kidney is compensating    . Depression   . Diplopia 06/19/2008   Qualifier: Diagnosis of  By: [name] MD, [name]   . Dysrhythmia    ATRIAL FIBRILATION  . Edema of lower extremity   . History of acute inferior wall MI   . History of CVA (cerebrovascular accident)   . Hyperlipidemia   . Hypertension   . Myocardial infarction 2956,2130  . OSA on CPAP    last test- 2010  . Pacemaker   . Pneumonia 2014   tx. ----  [facility]  . Shortness  of breath   . Stroke ([place]) [id number]  . TIA (transient ischemic attack)   . Transfusion history   . Unspecified hemorrhoids without mention of complication 8/65/7846   Colonoscopy--Dr. Carlean Purl     Family History  Problem Relation Age of Onset  . Suicidality Father     suicide death pt was 3 yrs  . Arrhythmia Mother   . Hypertension Mother   . Diabetes Mother   . Heart attack Brother 85  . Heart disease Paternal Aunt   . Prostate cancer Maternal Grandfather   . Diabetes Paternal Grandfather     fathers side of the family    Social History   Social History  . Marital status: Married    Spouse name: N/A  . Number of children: 0  . Years of education: HS   Occupational History  . retired     previously worked [employer]  . Smoking status: Former Smoker    Packs/day: 1.00    Years: 5.00    Types: Cigarettes    Start date: 05/11/1978    Quit date: 07/03/1982  . Smokeless tobacco: Never Used  .  Alcohol use Yes     Comment: once q 6 months-socailly-wine  . Drug use: No  . Sexual activity: Not Currently   Other Topics Concern  . 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   Married    Originally from [place] of two, high school education   Former smoker [phone] 1ppd   Hunting dogs 7    Retired from [employer] 2     Outpatient Medications Prior to Visit  Medication Sig Dispense Refill  . aspirin EC 81 MG tablet Take 81 mg by mouth at bedtime.     [name] atorvastatin (LIPITOR) 40 MG tablet TAKE 1 TABLET BY MOUTH EVERY DAY 90 tablet 2  . Cyanocobalamin 2500 MCG TABS Take 2,500 mcg by mouth daily. Vitamin B12    . diltiazem (CARDIZEM CD) 180 MG 24 hr capsule TAKE 1 CAPSULE (180 MG TOTAL) BY MOUTH DAILY. 90 capsule 2  . dofetilide (TIKOSYN) 250 MCG capsule TAKE 1 CAPSULE (250 MCG TOTAL) BY MOUTH 2 (TWO) TIMES DAILY. 180 capsule 2  . FLUoxetine  (PROZAC) 20 MG capsule TAKE ONE CAPSULE BY MOUTH EVERY MORNING 90 capsule 0  . furosemide (LASIX) 40 MG tablet Take 1 tablet (40 mg total) by mouth daily. 30 tablet 0  . KLOR-CON M20 20 MEQ tablet TAKE 1 TABLET BY MOUTH TWICE A DAY 180 tablet 3  . losartan (COZAAR) 100 MG tablet Take 1 tablet (100 mg total) by mouth daily. 90 tablet 2  . Lysine HCl 1000 MG TABS Take 1 tablet by mouth at bedtime.     . metoprolol succinate (TOPROL-XL) 100 MG 24 hr tablet Take 1 tablet (100 mg total) by mouth 2 (two) times daily. Take with or immediately following a meal. 60 tablet 0  . Multiple Vitamin (MULTIVITAMIN WITH MINERALS) TABS tablet Take 1 tablet by mouth daily. Centrum    . nitroGLYCERIN (NITROSTAT) 0.4 MG SL tablet Place 0.4 mg under the tongue every 5 (five) minutes x 3 doses as needed for chest pain.    Vladimir Faster Glycol-Propyl Glycol (SYSTANE OP) Place 1 drop into both eyes daily as needed (dry eyes).    [name] PRADAXA 150 MG CAPS capsule TAKE 1 CAPSULE BY MOUTH EVERY 12 HOURS 180 capsule 0  . PRESCRIPTION MEDICATION Inhale into the lungs at bedtime. CPAP    . benzonatate (TESSALON) 200 MG capsule Take 1 capsule (200 mg total) by mouth 3 (three) times daily as needed for cough. (Patient not taking: Reported on 04/05/2016) 20 capsule 0   No facility-administered medications prior to visit.      EXAM:  BP 120/74 (BP Location: Right Arm, Patient Position: Sitting, Cuff Size: Large)   Pulse 75   Temp 98.4 F (36.9 C) (Oral)   Ht 5\' 6"  (1.676 m)   Wt 260 lb 6.4 oz (118.1 kg)   SpO2 96%   BMI 42.03 kg/m   Body mass index is 42.03 kg/m.  GENERAL: vitals reviewed and listed above, alert, oriented, appears well hydrated and in no acute distressShe is scratching her forearms but no angioedema slight dyspnea a rare throat clearing cough. Cognition intact HEENT: atraumatic, conjunctiva  clear, no obvious abnormalities on inspection of external nose and ears OP : no lesion edema or exudate  NECK: no  obvious masses on inspection palpation  LUNGS: clear to auscultation bilaterally, no wheezes, rales or rhonchi, question decreased breath  sounds right base no rales. CV: HRRR, no clubbing cyanosis chronic changes in lower extremity minimal to trace  peripheral edema nl cap refill  MS: moves all extremities without noticeable focal  abnormality does have a cane but able to get up and down off the table. Skin upper thighs symmetrical blotchy and most scaly rash without vesicle. Rest of skin looks irritated and dry contact derm  areas on abdomen wehre patches were placed  Ext gu irritative rash   nodc nolesion   Red in perineum no vesicle dc  Or adenopathy PSYCH: pleasant and cooperative, no obvious depression or anxiety Lab Results  Component Value Date   WBC 10.1 04/01/2016   HGB 13.7 04/01/2016   HCT 43.1 04/01/2016   PLT 340 04/01/2016   GLUCOSE 120 (H) 04/02/2016   CHOL 100 03/31/2016   TRIG 143 03/31/2016   HDL 22 (L) 03/31/2016   LDLCALC 49 03/31/2016   ALT 53 04/01/2016   AST 33 04/01/2016   NA 138 04/02/2016   K 4.4 04/02/2016   CL 98 (L) 04/02/2016   CREATININE 1.03 (H) 04/02/2016   BUN 30 (H) 04/02/2016   CO2 31 04/02/2016   TSH 2.81 06/04/2015   INR 1.30 10/06/2013   HGBA1C 6.7 (H) 12/20/2015    ASSESSMENT AND PLAN:  Discussed the following assessment and plan:  Itching - with local rash concern about  allergic reaction  . see text   Perineal rash in female  Cough - Plan: DG Chest 2 View  Contact dermatitis due to adhesives, unspecified contact dermatitis type - topical  local rx   Acute on chronic congestive heart failure, unspecified congestive heart failure type ([place]) - Plan: DG Chest 2 View  Hx of bacterial pneumonia - Plan: DG Chest 2 View  Pre-diabetes early DM  Lab reveiwed    reviewed record as best possible. Suppose it could be left over from a medication but is no longer on antibiotics. She also may have a external GU yeast infection. Do not see  serious systemic symptoms but at risk. Chest x-ray today risk-benefit discussed antihistamine short course steroid that can affect her blood sugar and add an anti-yeast pill Diflucan for the vaginal perineal itching rash. There are questions about her cardiovascular medicines. She needs to see cardiology in the next week. We'll try to help facilitate because she  wasn't given an appointment on the way out of the hospital. appointment. They should be contacted about her medication adjustment.   Dx summary no pna on x ray but rx for clinical pna wioth resp failure  ? rochephin and doxy?  -Patient advised to return or notify health care team  if symptoms worsen ,persist or new concerns arise. Total visit 40 mins > 50% spent counseling and coordinating care as indicated in above note and in instructions to patient .  Record review etc     Patient Instructions   Consider allergic reaction with your new onset of itching. You definitely have contact dermatitis with the lead patches. You also look like you have some vaginal area itching probably disease. We need cardiology to make a decision about her diuretics and she noticed a told you to take a decreased dose on the way out of the hospital. Put in an order for a chest x-ray    Fu cause of the cough .   Take benadryl 25 mg every 6 hours with caution Cream for  Patch area rash  Depending  on x ray we will  have you take short course prednisone and  Anti yeast med for  Use moisturizer  To help with  Itching     [name]. Keliah Harned M.D.   Filed Weights   04/02/16 0647 04/03/16 0820 04/04/16 0523  Weight: 117.8 kg (259 lb 9.6 oz) 117.5 kg (259 lb) 117.2 kg (258 lb 6.4 oz)    History of present illness:  70 y.o.femalewith history of CHF, A.Fib on Tykosin, and pacemaker who presented to the ED with SOB which was worse with activity or laying down, better when sitting upright. She was placed on BiPAP and admitted to stepdown for a CHF  exacerbation. On the second day of admission she spiked a temperature of 101 raising the suspicion of possible underlying pneumonia.  Hospital Course:   Acute hypoxic respiratory failuredue to acute exacerbation of grade 2 diastolic CHF Resolved and breathing comfortably on room air. Improved on Lasix  FUO - suspected acute bronchitis  Chest x-ray without focal infiltrate but the patient did have right basilar crackles - UA unrevealing - WBC normalized - empiric antibiotics to stop after 7 days of tx - dry cough persists. Completed  7/7 of antibiotics  Chronic A. fib with RVR CHA2DS2 - VASc is at least 5 - s/p DCCV 9/29 and presently in NSR - Cards following - on Pradaxa   HTN Discharge on regimen listed below. Stable.  Morbid obesity - Body mass index is 41.8 kg/m.  OSA  Hyperlipidemia Continue home medical therapy  DM2 Newly diagnosed in June 2017 - diabetic diet  Procedures:  None  Consultations:  Cardiology         Discharge Exam:     Vitals:   04/04/16 0846 04/04/16 1221  BP: (!) 119/51 (!) 94/49  Pulse: 69 64  Resp: 18 18  Temp: 98.7 F (37.1 C) 99 F (37.2 C)    General: Pt in nad, alert and awake Cardiovascular: s1 and s2 present, no rubs Respiratory: no increased wob, no wheezes, speaking in full sentences  Discharge Instructions       Discharge Instructions    AMB Referral to [facility]    Complete by:  As directed   Reason for consult:  HTA patient LOS, post hospital follow up   Diagnoses of:   Heart Failure COPD/ Pneumonia     Expected date of contact:  1-3 days (reserved for hospital discharges)   Please assign to community nurse for transition of care calls and assess for home visits. PLEASE CALL HUSBAND's CELL PHONE NUMBER FOR BEST CONTACT  Dar[place] [phone].   Questions please call:   [name], RN BSN [facility] Liaison  [phone] business mobile phone Toll  free office [phone]   Call MD for:  difficulty breathing, headache or visual disturbances    Complete by:  As directed   Call MD for:  temperature >100.4    Complete by:  As directed   Diet - low sodium heart healthy    Complete by:  As directed   Discharge instructions    Complete by:  As directed   Pt has no new complaints. No acute issues overnight.   Increase activity slowly    Complete by:  As directed

## 2016-04-05 NOTE — Patient Instructions (Addendum)
  Consider allergic reaction with your new onset of itching. You definitely have contact dermatitis with the lead patches. You also look like you have some vaginal area itching probably disease. We need cardiology to make a decision about her diuretics and she noticed a told you to take a decreased dose on the way out of the hospital. Put in an order for a chest x-ray    Fu cause of the cough .   Take benadryl 25 mg every 6 hours with caution Cream for  Patch area rash  Depending  on x ray we will have you take short course prednisone and  Anti yeast med for  Use moisturizer  To help with  Itching

## 2016-04-05 NOTE — Progress Notes (Signed)
Pre visit review using our clinic review tool, if applicable. No additional management support is needed unless otherwise documented below in the visit note. 

## 2016-04-06 NOTE — Progress Notes (Signed)
Cardiology Office Note    Date:  04/07/2016   ID:  [name], DOB [birth year], MRN [id number]  PCP:  Lottie Dawson, MD  Cardiologist:  Dr. Acie Fredrickson Electrophysiologist: Dr. Lovena Le  Chief Complaint: Hospital follow up s/p  DCCV  History of Present Illness:   [name] is a 70 y.o. female CAD, PAF on Tikosyn, chronic diastolic dysfunction,  stroke, SSS s/p PPM ([facility]), HTN, HLD,  Sleep apnea on CPAP and recent admission for acute respiratory failure and  Required DCCV for afib who present today for follow up.   Hx of CAD. BMS x 2 to RCA in 2004. Cath 2007 showed in-stent restenosis of BMS to RCA s/p DES to RCA. 2D echo was in April 2015, which showed normal LVEF of 55-60%m, grade 2DD, mild MR and moderate TR noted. PA pressures were moderately to severely increased at 70 mm Hg. This resulted in a [facility], performed by Dr. Claiborne Billings on 10/06/2013. Cath showed preserved global LV function the estimated ejection fraction of 55% and evidence for a small area of mild mid-distal inferior hypocontractility.  No significant residual coronary obstructive disease with the appearance of a patent stent vs calcification in the LAD, normal left circumflex artery, and patent tandem stance in the RCA extending from the ostium to the mid segment. There was faint collateralization of a small RV marginal branch which is supplied via septal collaterals arising from the LAD. PA systolic pressure, by RHC was 57 mm.  Admitted 03/26/16 with Acute respiratory failure requiring BiPAP in setting of acute and chronic diastolic heart failure. The patient was seen by Dr. Rayann Heman for A. fib with RVR s/p successful cardioversion. Maintained sinus rhythm. She was diuresed 6.6L and discharge and 40 mg Lasix by mouth [name]. Patient completed treatment of pneumonia with 7 days of antibiotics while in house.   Seen by PCP 04/05/16 for hospital f/u. Found to have contact dermatitis and GU yeast infection. Stated on taper dose of  steroids, diflucan,  Benadryl and Tessalon (said never received rx --> new rx sent today). There was a confusion regarding her lasix. She was discharge on lasix 40mg  qd however I have reviewed her medication bottle (she brought here). It indicates that patient was on lasix 40mg  BID prior to admission. After discharge patient has been taking 40mg  BID at home. Her weight has been stable at 258lb. No dyspnea, LE edema, orthopnea, PND, chest pain or syncope. No melena. Her respiratory symptoms has improved as well as her itching.    Past Medical History:  Diagnosis Date  . Anticoagulant long-term use    pradaxa  . Anxiety   . Arthritis    low back & both knees, Spondylolisthesis    . CAD (coronary artery disease) 0277,4128   post PTCA with bare-metal stenting to mid RCA in December 2004     . CHF (congestive heart failure) ([place])   . Chronic atrial fibrillation ([place]) 06/2007   Tachybradycardia pacemaker  . Chronic kidney disease    10% function - ?R, other kidney is compensating    . Depression   . Diplopia 06/19/2008   Qualifier: Diagnosis of  By: [name] MD, [name]   . Dysrhythmia    ATRIAL FIBRILATION  . Edema of lower extremity   . History of acute inferior wall MI   . History of CVA (cerebrovascular accident)   . Hyperlipidemia   . Hypertension   . Myocardial infarction 7867,6720  . OSA on CPAP  last test- 2010  . Pacemaker   . Pneumonia 2014   tx. ----  [facility]  . Shortness of breath   . Stroke ([place]) [id number]  . TIA (transient ischemic attack)   . Transfusion history   . Unspecified hemorrhoids without mention of complication 9/93/7169   Colonoscopy--Dr. Carlean Purl     Past Surgical History:  Procedure Laterality Date  . APPENDECTOMY    . back surgeries     x2-3 by Dr Trenton Gammon  . BACK SURGERY    . CHOLECYSTECTOMY  1994  . CORONARY ANGIOPLASTY WITH STENT PLACEMENT  1998  . CORONARY STENT PLACEMENT     C stent  . DOPPLER ECHOCARDIOGRAPHY  2009  .  ELECTROPHYSIOLOGIC STUDY N/A 03/31/2016   Procedure: Cardioversion;  Surgeon: Evans Lance, MD;  Location: [place] CV LAB;  Service: Cardiovascular;  Laterality: N/A;  . LEFT AND RIGHT HEART CATHETERIZATION WITH CORONARY ANGIOGRAM N/A 10/06/2013   Procedure: LEFT AND RIGHT HEART CATHETERIZATION WITH CORONARY ANGIOGRAM;  Surgeon: Troy Sine, MD;  Location: [facility] CATH LAB;  Service: Cardiovascular;  Laterality: N/A;  . permanent pacemaker  06/2007  . TOTAL ABDOMINAL HYSTERECTOMY  1984   2 surgeries endometriosis bso    Current Medications: Prior to Admission medications   Medication Sig Start Date End Date Taking? Authorizing Provider  aspirin EC 81 MG tablet Take 81 mg by mouth at bedtime.     Historical Provider, MD  atorvastatin (LIPITOR) 40 MG tablet TAKE 1 TABLET BY MOUTH EVERY DAY 03/14/16   Thayer Headings, MD  benzonatate (TESSALON) 200 MG capsule Take 1 capsule (200 mg total) by mouth 3 (three) times [name] as needed for cough. Patient not taking: Reported on 04/05/2016 04/04/16   [name], MD  Cyanocobalamin 2500 MCG TABS Take 2,500 mcg by mouth [name]. Vitamin B12    Historical Provider, MD  diltiazem (CARDIZEM CD) 180 MG 24 hr capsule TAKE 1 CAPSULE (180 MG TOTAL) BY MOUTH [name]. 03/15/16   Thayer Headings, MD  dofetilide (TIKOSYN) 250 MCG capsule TAKE 1 CAPSULE (250 MCG TOTAL) BY MOUTH 2 (TWO) TIMES [name]. 02/28/16   Evans Lance, MD  FLUoxetine (PROZAC) 20 MG capsule TAKE ONE CAPSULE BY MOUTH EVERY MORNING 03/17/16   [name], MD  furosemide (LASIX) 40 MG tablet Take 1 tablet (40 mg total) by mouth [name]. 04/04/16   [name], MD  KLOR-CON M20 20 MEQ tablet TAKE 1 TABLET BY MOUTH TWICE A DAY 05/20/15   Thayer Headings, MD  losartan (COZAAR) 100 MG tablet Take 1 tablet (100 mg total) by mouth [name]. 03/15/16   Thayer Headings, MD  Lysine HCl 1000 MG TABS Take 1 tablet by mouth at bedtime.     Historical Provider, MD  metoprolol succinate (TOPROL-XL) 100 MG 24 hr tablet  Take 1 tablet (100 mg total) by mouth 2 (two) times [name]. Take with or immediately following a meal. 04/04/16   [name], MD  Multiple Vitamin (MULTIVITAMIN WITH MINERALS) TABS tablet Take 1 tablet by mouth [name]. Centrum    Historical Provider, MD  nitroGLYCERIN (NITROSTAT) 0.4 MG SL tablet Place 0.4 mg under the tongue every 5 (five) minutes x 3 doses as needed for chest pain.    Historical Provider, MD  Polyethyl Glycol-Propyl Glycol (SYSTANE OP) Place 1 drop into both eyes [name] as needed (dry eyes).    Historical Provider, MD  PRADAXA 150 MG CAPS capsule TAKE 1 CAPSULE BY MOUTH EVERY 12 HOURS 02/01/16  Thayer Headings, MD  predniSONE (DELTASONE) 20 MG tablet Take 3 po qd for 2 days then 2 po qd for 3 days,or as directed 04/05/16   [name], MD  PRESCRIPTION MEDICATION Inhale into the lungs at bedtime. CPAP    Historical Provider, MD  triamcinolone cream (KENALOG) 0.1 % Apply 1 application topically 2 (two) times [name]. 04/05/16   [name], MD    Allergies:   Review of patient's allergies indicates no known allergies.   Social History   Social History  . Marital status: Married    Spouse name: N/A  . Number of children: 0  . Years of education: HS   Occupational History  . retired     previously worked [employer]  . Smoking status: Former Smoker    Packs/day: 1.00    Years: 5.00    Types: Cigarettes    Start date: 05/11/1978    Quit date: 07/03/1982  . Smokeless tobacco: Never Used  . Alcohol use Yes     Comment: once q 6 months-socailly-wine  . Drug use: No  . Sexual activity: Not Currently   Other Topics Concern  . 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   Married    Originally from [place] of two, high school education   Former smoker Pension scheme manager dogs 7    Retired from [employer] 2      Family History:  The  patient's family history includes Arrhythmia in her mother; Diabetes in her mother and paternal grandfather; Heart attack (age of onset: 76) in her brother; Heart disease in her paternal aunt; Hypertension in her mother; Prostate cancer in her maternal grandfather; Suicidality in her father.   ROS:   Please see the history of present illness.    ROS All other systems reviewed and are negative.   PHYSICAL EXAM:   VS:  BP 124/68   Pulse 71   Ht 5\' 6"  (1.676 m)   Wt 259 lb (117.5 kg)   SpO2 95%   BMI 41.80 kg/m    GEN: Well nourished, well developed, in no acute distress  HEENT: normal  Neck: no JVD, carotid bruits, or masses Cardiac: RRR; no murmurs, rubs, or gallops,no edema  Respiratory:  clear to auscultation bilaterally, normal work of breathing GI: soft, nontender, nondistended, + BS MS: no deformity or atrophy  Skin: warm and dry, no rash Neuro:  Alert and Oriented x 3, Strength and sensation are intact Psych: euthymic mood, full affect  Wt Readings from Last 3 Encounters:  04/07/16 259 lb (117.5 kg)  04/05/16 260 lb 6.4 oz (118.1 kg)  04/04/16 258 lb 6.4 oz (117.2 kg)      Studies/Labs Reviewed:   EKG:  EKG is ordered today.  The ekg ordered today demonstrates NSR at rate of 65 BPM.   Recent Labs: 06/04/2015: TSH 2.81 03/26/2016: B Natriuretic Peptide 553.9 04/01/2016: ALT 53; Hemoglobin 13.7; Magnesium 2.2; Platelets 340 04/02/2016: BUN 30; Creatinine, Ser 1.03; Potassium 4.4; Sodium 138   Lipid Panel    Component Value Date/Time   CHOL 100 03/31/2016 0248   TRIG 143 03/31/2016 0248   HDL 22 (L) 03/31/2016 0248   CHOLHDL 4.5 03/31/2016 0248   VLDL 29 03/31/2016 0248   LDLCALC 49 03/31/2016 0248    Additional studies/ records that were  reviewed today include:    Echocardiogram 03/28/2016: Study Conclusions  - Left ventricle: The cavity size was normal. Wall thickness was increased in a pattern of mild LVH. Systolic function was normal. The estimated  ejection fraction was in the range of 55% to 60%. Indeterminant diastolic function (atrial fibrillation). Wall motion was normal; there were no regional wall motion abnormalities. - Aortic valve: There was no stenosis. - Mitral valve: Mildly calcified annulus. There was no significant regurgitation. - Left atrium: The atrium was mildly dilated. - Right ventricle: The cavity size was normal. Pacer wire or catheter noted in right ventricle. Systolic function was normal. - Tricuspid valve: Peak RV-RA gradient (S): 22 mm Hg. - Pulmonary arteries: PA peak pressure: 30 mm Hg (S). - Systemic veins: IVC measured 2.3 cm with normal respirophasic variation, suggesting RA pressure 8 mmHg.  Impressions:  - The patient was in atrial fibrillation with rapid ventricular response. Normal LV size with mild LV hypertrophy, EF 55-60%. .Normal RV size and systolic function. No significant valvular abnormalities.  Cardiac Catheterization: 10/2013 HEMODYNAMICS:   RA: a 11 mean 8 RV: 57/10 PA: 57/18 Pc: 17 AO: 150/70 LV: 150/18   Oxygen saturation: PA: 61%                                 LV: 89%  Cardiac output: 5.8 (thermodilution); 7.0 (Fick) Cardiac index:    2.7                            3.2  ANGIOGRAPHY:  1. Left main: Angiographically normal and bifurcated into the LAD and left circumflex vessel. 2. LAD: Appeared angiographically normal. Between the first diagonal and second diagonal vessel there appeared to be a stent versus calcification. This was widely patent. There was a prominent proximal septal perforator artery which supplied collaterals to a small RV marginal branch of the RCA 3. Left circumflex: normal 4. Right coronary artery: Tandem stents were in place extending from the ostium the mid RCA. There was mild irregularity with narrowing of 10-20% within the stented segment. The small RV marginal branch branch which was collateralized from the septals arising  from the LAD was not visualized antegrade. The distal RCA beyond the stented segment was free of disease and gave rise to a PDA and PLA vessel.   Left ventriculography revealed mild left ventricular hypertrophy. Ejection fraction estimate was approximately 55%. There was a small area mid-distal inferior hypocontractility.   IMPRESSION:  Preserved global LV function the estimated ejection fraction of 55% and evidence for a small area of mild mid-distal inferior hypocontractility  No significant residual coronary obstructive disease with the appearance of a patent stent in the LAD, normal left circumflex artery, and patent tandem stance in the RCA extending from the ostium to the mid segment. There is faint collateralization of a small RV marginal branch which is supplied via septal collaterals arising from the LAD.  Moderate pulmonary hypertension following recent aggressive diuresis with a PA systolic pressure of 57 mm.    ASSESSMENT & PLAN:    1. Chronic diastolic heart failure - Echo during admission shows left ventricular function of 55-60% and grade 2 diastolic dysfunction. She has been doing well on Lasix 40mg  BID without LE edema, dyspnea, orthopnea or PND. Weight has been stable at home to 258lb. Here 259lb. Euvolemic on exam. Will continue current dose of Lasix 40mg   BID. She has next office visit with PCP in 2 weeks --> advised to get BMET and Mg.   2. PAF - Status post recent cardioversion during admission. Maintaining sinus rhythm on Tikosyn. CHA2DS2 - VASc score of 6. Continue Pradexa for anticoagulation. Continue Toprol XL 100mg  BID and Cardizem CD 180mg  qd.   3. SSS s/p PPM (st.Jude) - Followed by Dr.Taylor  4. CAD s/p stent placement as noted in HPI - No anginal pain  5. Recent admission for pneumonia - Seems improving. F/u with PCP as schedule in 2 weeks. She is currently on taper dose of steroids.   Medication Adjustments/Labs and Tests Ordered: Current  medicines are reviewed at length with the patient today.  Concerns regarding medicines are outlined above.  Medication changes, Labs and Tests ordered today are listed in the Patient Instructions below. Patient Instructions  Medication Instructions:  Your physician recommends that you continue on your current medications as directed. Please refer to the Current Medication list given to you today.   Labwork: None ordered  Testing/Procedures: None ordered  Follow-Up: Your physician recommends that you schedule a follow-up appointment in: [place] Acie Fredrickson    Any Other Special Instructions Will Be Listed Below (If Applicable).     If you need a refill on your cardiac medications before your next appointment, please call your pharmacy.      Jarrett Soho, PA  04/07/2016 1:54 PM    [facility] [name], [place], [place]  [address] Phone: [phone]; Fax: [phone]

## 2016-04-07 ENCOUNTER — Encounter: Payer: Self-pay | Admitting: Physician Assistant

## 2016-04-07 ENCOUNTER — Ambulatory Visit (INDEPENDENT_AMBULATORY_CARE_PROVIDER_SITE_OTHER): Payer: PPO | Admitting: Physician Assistant

## 2016-04-07 ENCOUNTER — Other Ambulatory Visit: Payer: Self-pay | Admitting: *Deleted

## 2016-04-07 VITALS — BP 124/68 | HR 71 | Ht 66.0 in | Wt 259.0 lb

## 2016-04-07 DIAGNOSIS — Z950 Presence of cardiac pacemaker: Secondary | ICD-10-CM

## 2016-04-07 DIAGNOSIS — I10 Essential (primary) hypertension: Secondary | ICD-10-CM

## 2016-04-07 DIAGNOSIS — E785 Hyperlipidemia, unspecified: Secondary | ICD-10-CM | POA: Insufficient documentation

## 2016-04-07 DIAGNOSIS — I480 Paroxysmal atrial fibrillation: Secondary | ICD-10-CM

## 2016-04-07 DIAGNOSIS — I5032 Chronic diastolic (congestive) heart failure: Secondary | ICD-10-CM | POA: Diagnosis not present

## 2016-04-07 DIAGNOSIS — I2510 Atherosclerotic heart disease of native coronary artery without angina pectoris: Secondary | ICD-10-CM

## 2016-04-07 MED ORDER — BENZONATATE 100 MG PO CAPS: 200.0000 mg | ORAL_CAPSULE | Freq: Three times a day (TID) | ORAL | 0 refills | Status: DC | PRN

## 2016-04-07 NOTE — Patient Outreach (Signed)
[facility] [facility]) Care Management  04/07/2016  JAKERIA CAISSIE [birth year] [id number]   RN attempted initial outreach call today however unsuccessful. Will rescheduled for Monday for transition of care contact based upon a recent discharge.  Raina Mina, RN Care Management Coordinator [place] Office [phone]

## 2016-04-07 NOTE — Patient Instructions (Addendum)
Medication Instructions:  Your physician recommends that you continue on your current medications as directed. Please refer to the Current Medication list given to you today.   Labwork: None ordered  Testing/Procedures: None ordered  Follow-Up: Your physician recommends that you schedule a follow-up appointment in: [place] Acie Fredrickson    Any Other Special Instructions Will Be Listed Below (If Applicable).     If you need a refill on your cardiac medications before your next appointment, please call your pharmacy.

## 2016-04-10 ENCOUNTER — Other Ambulatory Visit: Payer: Self-pay | Admitting: *Deleted

## 2016-04-10 NOTE — Patient Outreach (Signed)
[place] [facility]) Care Management  04/10/2016  [name] 1945-09-27 [id number]   RN attempted another outreach call however remain unsuccessful. RN able to leave a HIPAA approved voice message requesting a call back. Will continue outreach calls and reschedule another follow up call.   Raina Mina, RN Care Management Coordinator [place] Office [phone]

## 2016-04-13 ENCOUNTER — Other Ambulatory Visit: Payer: Self-pay | Admitting: *Deleted

## 2016-04-13 ENCOUNTER — Encounter: Payer: Self-pay | Admitting: *Deleted

## 2016-04-13 ENCOUNTER — Ambulatory Visit: Payer: PPO | Admitting: *Deleted

## 2016-04-13 NOTE — Patient Outreach (Signed)
[facility]) Care Management  04/13/2016  BETHANIA SCHLOTZHAUER [birth year] [id number]   RN third outreach attempt to reach this pt through the requested caregiver Darlin Drop [phone]) however unsuccessful. Will sent outreach letter to request a call back to inquire further on pt's needs and needed resources.   Raina Mina, RN Care Management Coordinator [place] Office [phone]

## 2016-04-17 ENCOUNTER — Other Ambulatory Visit: Payer: Self-pay | Admitting: Cardiovascular Disease

## 2016-04-17 DIAGNOSIS — G4733 Obstructive sleep apnea (adult) (pediatric): Secondary | ICD-10-CM | POA: Diagnosis not present

## 2016-04-17 NOTE — Progress Notes (Signed)
Pre visit review using our clinic review tool, if applicable. No additional management support is needed unless otherwise documented below in the visit note.  Chief Complaint  Patient presents with  . Follow-up    HPI: [name] 70 y.o.   Fu of [name]  comes in today for follow up of  multiple medical problems.   Itching  rx with pred and  Anti years  Has seen card for her  paf Doing a lot better .  Taking extra lasix 1/2 20 mg  If needed and only taken    3 x in the last 3 months  Pills for the cough .  No tneeded.   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 No cough fever  New sob .  No bleeding    ROS: See pertinent positives and negatives per HPI.  Past Medical History:  Diagnosis Date  . Anticoagulant long-term use    pradaxa  . Anxiety   . Arthritis    low back & both knees, Spondylolisthesis    . CAD (coronary artery disease) 6967,8938   post PTCA with bare-metal stenting to mid RCA in December 2004     . CHF (congestive heart failure) ([place])   . Chronic atrial fibrillation ([place]) 06/2007   Tachybradycardia pacemaker  . Chronic kidney disease    10% function - ?R, other kidney is compensating    . Depression   . Diplopia 06/19/2008   Qualifier: Diagnosis of  By: [name] MD, [name]   . Dysrhythmia    ATRIAL FIBRILATION  . Edema of lower extremity   . History of acute inferior wall MI   . History of CVA (cerebrovascular accident)   . Hyperlipidemia   . Hypertension   . Myocardial infarction 1017,5102  . OSA on CPAP    last test- 2010  . Pacemaker   . Pneumonia 2014   tx. ----  [facility]  . Shortness of breath   . Stroke ([place]) [id number]  . TIA (transient ischemic attack)   . Transfusion history   . Unspecified hemorrhoids without mention of complication 5/85/2778   Colonoscopy--Dr. Carlean Purl     Family History  Problem Relation Age of Onset  . Suicidality Father     suicide death pt was  3 yrs  . Arrhythmia Mother   . Hypertension Mother   . Diabetes Mother   . Heart attack Brother 41  . Heart disease Paternal Aunt   . Prostate cancer Maternal Grandfather   . Diabetes Paternal Grandfather     fathers side of the family    Social History   Social History  . Marital status: Married    Spouse name: N/A  . Number of children: 0  . Years of education: HS   Occupational History  . retired     previously worked [employer]  . Smoking status: Former Smoker    Packs/day: 1.00    Years: 5.00    Types: Cigarettes    Start date: 05/11/1978    Quit date: 07/03/1982  . Smokeless tobacco: Never Used  . Alcohol use Yes     Comment: once q 6 months-socailly-wine  . Drug use: No  . Sexual activity: Not Currently   Other Topics Concern  . 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   Married  Originally from [employer] of two, high school education   Former smoker [phone] 1ppd   Hunting dogs 7    Retired from [employer] 2     Outpatient Medications Prior to Visit  Medication Sig Dispense Refill  . aspirin EC 81 MG tablet Take 81 mg by mouth at bedtime.     [name] atorvastatin (LIPITOR) 40 MG tablet TAKE 1 TABLET BY MOUTH EVERY DAY 90 tablet 2  . Cyanocobalamin 2500 MCG TABS Take 2,500 mcg by mouth daily. Vitamin B12    . diltiazem (CARDIZEM CD) 180 MG 24 hr capsule TAKE 1 CAPSULE (180 MG TOTAL) BY MOUTH DAILY. 90 capsule 2  . diphenhydrAMINE (BENADRYL) 25 MG tablet Take 25 mg by mouth every 6 (six) hours as needed for itching or allergies.    [name] dofetilide (TIKOSYN) 250 MCG capsule TAKE 1 CAPSULE (250 MCG TOTAL) BY MOUTH 2 (TWO) TIMES DAILY. 180 capsule 2  . FLUoxetine (PROZAC) 20 MG capsule TAKE ONE CAPSULE BY MOUTH EVERY MORNING 90 capsule 0  . furosemide (LASIX) 40 MG tablet Take 40 mg by mouth 2 (two) times daily.    . furosemide (LASIX) 40 MG  tablet TAKE 1 TABLET BY MOUTH TWICE A DAY 180 tablet 3  . KLOR-CON M20 20 MEQ tablet TAKE 1 TABLET BY MOUTH TWICE A DAY 180 tablet 3  . losartan (COZAAR) 100 MG tablet Take 1 tablet (100 mg total) by mouth daily. 90 tablet 2  . Lysine HCl 1000 MG TABS Take 1 tablet by mouth at bedtime.     . metoprolol succinate (TOPROL-XL) 100 MG 24 hr tablet Take 1 tablet (100 mg total) by mouth 2 (two) times daily. Take with or immediately following a meal. 60 tablet 0  . Multiple Vitamin (MULTIVITAMIN WITH MINERALS) TABS tablet Take 1 tablet by mouth daily. Centrum    . nitroGLYCERIN (NITROSTAT) 0.4 MG SL tablet Place 0.4 mg under the tongue every 5 (five) minutes x 3 doses as needed for chest pain.    Vladimir Faster Glycol-Propyl Glycol (SYSTANE OP) Place 1 drop into both eyes daily as needed (dry eyes).    [name] PRADAXA 150 MG CAPS capsule TAKE 1 CAPSULE BY MOUTH EVERY 12 HOURS 180 capsule 0  . PRESCRIPTION MEDICATION Inhale into the lungs at bedtime. CPAP    . triamcinolone cream (KENALOG) 0.1 % Apply 1 application topically 2 (two) times daily. 30 g 0  . benzonatate (TESSALON PERLES) 100 MG capsule Take 2 capsules (200 mg total) by mouth 3 (three) times daily as needed for cough. 30 capsule 0  . predniSONE (DELTASONE) 20 MG tablet Take 3 po qd for 2 days then 2 po qd for 3 days,or as directed 12 tablet 0   No facility-administered medications prior to visit.      EXAM:  BP 124/70 (BP Location: Right Arm, Patient Position: Sitting, Cuff Size: Large)   Pulse 66   Temp 98.3 F (36.8 C) (Oral)   Ht 5\' 6"  (1.676 m)   Wt 262 lb 9.6 oz (119.1 kg)   SpO2 96%   BMI 42.38 kg/m   Body mass index is 42.38 kg/m.  GENERAL: vitals reviewed and listed above, alert, oriented, appears well hydrated and in no acute distress HEENT: atraumatic, conjunctiva  clear, no obvious abnormalities on inspection of external nose and ears  NECK: no obvious masses on inspection palpation  LUNGS: clear to auscultation  bilaterally, no wheezes,  rales or rhonchi, good air movement CV: HRRR, no clubbing cyanosis or  Min to 1+  nl cap refill  MS: moves all extremities without noticeable focal  abnormality PSYCH: pleasant and cooperative, no obvious depression or anxiety Lab Results  Component Value Date   WBC 10.1 04/01/2016   HGB 13.7 04/01/2016   HCT 43.1 04/01/2016   PLT 340 04/01/2016   GLUCOSE 120 (H) 04/02/2016   CHOL 100 03/31/2016   TRIG 143 03/31/2016   HDL 22 (L) 03/31/2016   LDLCALC 49 03/31/2016   ALT 53 04/01/2016   AST 33 04/01/2016   NA 138 04/02/2016   K 4.4 04/02/2016   CL 98 (L) 04/02/2016   CREATININE 1.03 (H) 04/02/2016   BUN 30 (H) 04/02/2016   CO2 31 04/02/2016   TSH 2.81 06/04/2015   INR 1.30 10/06/2013   HGBA1C 6.9 04/18/2016    ASSESSMENT AND PLAN:  Discussed the following assessment and plan:  Pre-diabetes early DM  - controlled  a1c 6.9 acceptable - Plan: POCT A1C  Need for prophylactic vaccination and inoculation against influenza - Plan: Flu vaccine HIGH DOSE PF (Fluzone High dose)  Anticoagulant long-term use  Hx of bacterial pneumonia  Chronic diastolic heart failure- grade 2 diastolic dysfunction by echo 10/03/13 Monitorextra Lasix use if having to use more often may need chemistry panel done  Last k and mag was normal   Total visit 15mins > 50% spent counseling and coordinating care as indicated in above note and in instructions to patient .    Record  meds reviewed w patient and plan  Diet   Exercise etc  -Patient advised to return or notify health care team  if symptoms worsen ,persist or new concerns arise.  Patient Instructions   Glad you are  doing better .  Monitor how often you have to take extra diuretic .  To keep a watch on potassium etc.   Continue lifestyle intervention healthy eating and exercise .  Plan  ROV  In 6 months .     [name]. Rhylin Venters M.D.

## 2016-04-18 ENCOUNTER — Ambulatory Visit (INDEPENDENT_AMBULATORY_CARE_PROVIDER_SITE_OTHER): Payer: PPO | Admitting: Internal Medicine

## 2016-04-18 ENCOUNTER — Encounter: Payer: Self-pay | Admitting: Internal Medicine

## 2016-04-18 VITALS — BP 124/70 | HR 66 | Temp 98.3°F | Ht 66.0 in | Wt 262.6 lb

## 2016-04-18 DIAGNOSIS — Z23 Encounter for immunization: Secondary | ICD-10-CM | POA: Diagnosis not present

## 2016-04-18 DIAGNOSIS — R7303 Prediabetes: Secondary | ICD-10-CM | POA: Diagnosis not present

## 2016-04-18 DIAGNOSIS — Z7901 Long term (current) use of anticoagulants: Secondary | ICD-10-CM

## 2016-04-18 DIAGNOSIS — Z8701 Personal history of pneumonia (recurrent): Secondary | ICD-10-CM | POA: Diagnosis not present

## 2016-04-18 DIAGNOSIS — I5032 Chronic diastolic (congestive) heart failure: Secondary | ICD-10-CM

## 2016-04-18 LAB — POCT GLYCOSYLATED HEMOGLOBIN (HGB A1C): Hemoglobin A1C: 6.9

## 2016-04-18 NOTE — Patient Instructions (Addendum)
  Glad you are  doing better .  Monitor how often you have to take extra diuretic .  To keep a watch on potassium etc.   Continue lifestyle intervention healthy eating and exercise .  Plan  ROV  In 6 months .

## 2016-04-21 ENCOUNTER — Ambulatory Visit: Payer: PPO | Admitting: Internal Medicine

## 2016-04-28 ENCOUNTER — Other Ambulatory Visit: Payer: Self-pay | Admitting: Cardiovascular Disease

## 2016-04-28 NOTE — Telephone Encounter (Signed)
2. PAF - Status post recent cardioversion during admission. Maintaining sinus rhythm on Tikosyn. CHA2DS2 - VASc score of 6. Continue Pradexa for anticoagulation. Continue Toprol XL 100mg  BID and Cardizem CD 180mg  qd.

## 2016-04-28 NOTE — Telephone Encounter (Signed)
CONTINUE these medications which have CHANGED   Details  furosemide (LASIX) 40 MG tablet Take 1 tablet (40 mg total) by mouth daily. Qty: 30 tablet, Refills: 0    metoprolol succinate (TOPROL-XL) 100 MG 24 hr tablet Take 1 tablet (100 mg total) by mouth 2 (two) times daily. Take with or immediately following a meal. Qty: 60 tablet, Refills: 0

## 2016-05-01 ENCOUNTER — Encounter: Payer: Self-pay | Admitting: *Deleted

## 2016-05-01 ENCOUNTER — Other Ambulatory Visit: Payer: Self-pay | Admitting: *Deleted

## 2016-05-01 NOTE — Patient Outreach (Signed)
[facility]) Care Management  05/01/2016  [name] 03-08-1946 [id number]   Unsuccessful contacts and no response to outreach letter. Will request closure of this case and notify the primary provider.  Raina Mina, RN Care Management Coordinator [place] Office [phone]

## 2016-05-04 ENCOUNTER — Other Ambulatory Visit: Payer: Self-pay | Admitting: Cardiovascular Disease

## 2016-05-06 ENCOUNTER — Other Ambulatory Visit: Payer: Self-pay | Admitting: Cardiovascular Disease

## 2016-05-15 ENCOUNTER — Telehealth: Payer: Self-pay | Admitting: Cardiovascular Disease

## 2016-05-15 ENCOUNTER — Telehealth: Payer: Self-pay | Admitting: *Deleted

## 2016-05-15 DIAGNOSIS — I480 Paroxysmal atrial fibrillation: Secondary | ICD-10-CM

## 2016-05-15 DIAGNOSIS — I5031 Acute diastolic (congestive) heart failure: Secondary | ICD-10-CM

## 2016-05-15 MED ORDER — FUROSEMIDE 40 MG PO TABS: ORAL_TABLET | ORAL | 0 refills | Status: DC

## 2016-05-15 NOTE — Telephone Encounter (Signed)
Patient called and requested a new rx for furosemide. She stated that when she was in the hospital she was told to take as many as needed to control her swelling. She has been taking two in the morning and one in the evening and this has been working well for her. She can be reached at [phone].

## 2016-05-15 NOTE — Telephone Encounter (Signed)
Patient called to request a 90 day supply of her Lasix since she will leaving Friday to go out of town for 6 weeks.  At last OV, patient confirmed she was taking Lasix 40 mg BID. She states when she was in the ED at the end of September, they told her she could "take as much Lasix as she needed to keep the swelling down." She has been taking Lasix 80 mg qAM and 40 mg q PM for weeks, and states that dosage is "doing the job well." She denies SOB and swelling and states her weight is stable.  Informed patient that Dr. Acie Fredrickson will have to be consulted prior to calling in her medication at a different dosage.

## 2016-05-15 NOTE — Telephone Encounter (Signed)
Left message to call back  

## 2016-05-15 NOTE — Telephone Encounter (Signed)
See phone note from earlier today. 

## 2016-05-15 NOTE — Telephone Encounter (Signed)
Pt is aware Dr Acie Fredrickson said okay to take refill 90 day supply of lasix at current dose, Lasix 80mg  q AM and Lasix 40mg  q PM  Pt is aware  an appointment already scheduled 07/06/16 with Dr Acie Fredrickson and BMET/Magnesium.

## 2016-05-15 NOTE — Telephone Encounter (Signed)
We can refill the lasix at the dose that she is taking . Lets check a BMP and magnesium in 2 months

## 2016-05-15 NOTE — Telephone Encounter (Signed)
Patient is going out of town for 6 weeks and her medications will run out before she comes back. What should she do?  Can she get refills before she leaves?

## 2016-05-15 NOTE — Telephone Encounter (Signed)
Please find out what medications pt is talking about please. Thanks

## 2016-05-18 DIAGNOSIS — G4733 Obstructive sleep apnea (adult) (pediatric): Secondary | ICD-10-CM | POA: Diagnosis not present

## 2016-06-08 ENCOUNTER — Other Ambulatory Visit: Payer: Self-pay | Admitting: Cardiovascular Disease

## 2016-06-08 NOTE — Telephone Encounter (Signed)
Medication Detail    Disp Refills Start End   dofetilide (TIKOSYN) 250 MCG capsule 180 capsule 2 02/28/2016    Sig: TAKE 1 CAPSULE (250 MCG TOTAL) BY MOUTH 2 (TWO) TIMES DAILY.   E-Prescribing Status: Receipt confirmed by pharmacy (02/28/2016 4:59 PM EDT)   Pharmacy   [id number] - [place], [place] - [address]

## 2016-06-16 ENCOUNTER — Encounter: Payer: Self-pay | Admitting: Cardiovascular Disease

## 2016-06-17 DIAGNOSIS — G4733 Obstructive sleep apnea (adult) (pediatric): Secondary | ICD-10-CM | POA: Diagnosis not present

## 2016-06-23 DIAGNOSIS — R000 Tachycardia, unspecified: Secondary | ICD-10-CM | POA: Diagnosis not present

## 2016-06-23 DIAGNOSIS — R0789 Other chest pain: Secondary | ICD-10-CM | POA: Diagnosis not present

## 2016-06-23 DIAGNOSIS — I639 Cerebral infarction, unspecified: Secondary | ICD-10-CM | POA: Diagnosis not present

## 2016-06-23 DIAGNOSIS — R109 Unspecified abdominal pain: Secondary | ICD-10-CM | POA: Diagnosis not present

## 2016-06-23 DIAGNOSIS — R0602 Shortness of breath: Secondary | ICD-10-CM | POA: Diagnosis not present

## 2016-06-23 DIAGNOSIS — Z87891 Personal history of nicotine dependence: Secondary | ICD-10-CM | POA: Diagnosis not present

## 2016-06-23 DIAGNOSIS — I10 Essential (primary) hypertension: Secondary | ICD-10-CM | POA: Diagnosis not present

## 2016-06-23 DIAGNOSIS — I2510 Atherosclerotic heart disease of native coronary artery without angina pectoris: Secondary | ICD-10-CM | POA: Diagnosis not present

## 2016-06-23 DIAGNOSIS — E039 Hypothyroidism, unspecified: Secondary | ICD-10-CM | POA: Diagnosis not present

## 2016-06-23 DIAGNOSIS — R350 Frequency of micturition: Secondary | ICD-10-CM | POA: Diagnosis not present

## 2016-06-23 DIAGNOSIS — Z950 Presence of cardiac pacemaker: Secondary | ICD-10-CM | POA: Diagnosis not present

## 2016-06-23 DIAGNOSIS — I4891 Unspecified atrial fibrillation: Secondary | ICD-10-CM | POA: Diagnosis not present

## 2016-06-27 ENCOUNTER — Telehealth: Payer: Self-pay | Admitting: Internal Medicine

## 2016-06-27 NOTE — Telephone Encounter (Signed)
New Message  Pt voiced she's been in afib for about a week now and spent a day/night at [facility].  Pt voiced they've shocked her heart back into rhythm and wanting to know if she needs to come in sooner.  Pt has appt with MD-Nahser on 1/4 @ 745 am.  Pt voiced she thinks it's her device MD-Taylor put in.  Please f/u with pt

## 2016-06-27 NOTE — Telephone Encounter (Signed)
Spoke with patient, rescheduled Device Clinic PPM check from 07/31/16 to 07/06/16 at 8:00am since patient will already be in the office seeing Dr. Acie Fredrickson at that time.  Patient is agreeable to this plan and is aware that the 1/29 appointment will be canceled.  She is appreciative of call and denies additional questions or concerns at this time.

## 2016-06-27 NOTE — Telephone Encounter (Addendum)
Patient had severe pain in her back last week while visiting her monther, was taken via EMS to a hospital in [facility].  She states that the MD in the ED told her that the back pain was caused by one kidney being smaller than the other, but also noted her HR was in the 140s.  They gave her rate control medication via IV and discharged her once the back pain was resolved.  Her most recent cardioversion was actually in 03/2016, not during this recent ED admission per patient. Patient reports that the MD told her that her PPM was "taking care of the A-fib".  Patient wants to know how this is possible.  Advised patient that the MD likely meant that her HR could not slow down too much because of the PPM.  Advised that it cannot prevent her HR from being elevated.  Patient verbalizes understanding.  Patient states that she thinks she is still in A-fib now, but that her only symptom is a "skip and a hard heart beat every once in awhile".  She is taking her medications as prescribed, including Tikosyn, diltiazem, and Pradaxa.  She denies heart racing sensation, palpitations, chest discomfort, ShOB, edema, or other cardiac symptoms.  Patient reports that she "feels good".  Her PPM is not capable of remote transmissions.  Advised patient that she should keep her 07/06/16 appointment with Dr. Acie Fredrickson.  Advised that if she has new or worsening symptoms in the meantime, she should contact our office or proceed to the ED.  Patient verbalizes understanding and denies additional questions or concerns at this time.

## 2016-06-30 ENCOUNTER — Other Ambulatory Visit: Payer: Self-pay | Admitting: Internal Medicine

## 2016-06-30 NOTE — Telephone Encounter (Signed)
Sent to the pharmacy by e-scribe for 6 months.  Pt has upcoming 6 month follow up in April 2018.

## 2016-07-05 ENCOUNTER — Telehealth: Payer: Self-pay | Admitting: Family Medicine

## 2016-07-05 NOTE — Telephone Encounter (Signed)
Pt seen at [facility]. Dr.P received their note.  Please help the pt make a 30 minute appointment in 2-3 weeks for follow up.  Thanks!!

## 2016-07-05 NOTE — Telephone Encounter (Signed)
Pt has been sch

## 2016-07-06 ENCOUNTER — Ambulatory Visit (INDEPENDENT_AMBULATORY_CARE_PROVIDER_SITE_OTHER): Payer: PPO | Admitting: Cardiovascular Disease

## 2016-07-06 ENCOUNTER — Other Ambulatory Visit: Payer: PPO

## 2016-07-06 ENCOUNTER — Encounter: Payer: Self-pay | Admitting: Cardiovascular Disease

## 2016-07-06 ENCOUNTER — Ambulatory Visit (INDEPENDENT_AMBULATORY_CARE_PROVIDER_SITE_OTHER): Payer: PPO | Admitting: *Deleted

## 2016-07-06 VITALS — BP 128/60 | HR 100 | Ht 66.5 in | Wt 262.6 lb

## 2016-07-06 DIAGNOSIS — I272 Pulmonary hypertension, unspecified: Secondary | ICD-10-CM | POA: Diagnosis not present

## 2016-07-06 DIAGNOSIS — I5032 Chronic diastolic (congestive) heart failure: Secondary | ICD-10-CM | POA: Diagnosis not present

## 2016-07-06 DIAGNOSIS — I480 Paroxysmal atrial fibrillation: Secondary | ICD-10-CM | POA: Diagnosis not present

## 2016-07-06 DIAGNOSIS — I10 Essential (primary) hypertension: Secondary | ICD-10-CM

## 2016-07-06 DIAGNOSIS — Z950 Presence of cardiac pacemaker: Secondary | ICD-10-CM

## 2016-07-06 LAB — CUP PACEART INCLINIC DEVICE CHECK
Battery Impedance: 1100 Ohm
Battery Voltage: 2.79 V
Brady Statistic RA Percent Paced: 1.9 %
Brady Statistic RV Percent Paced: 1 % — CL
Date Time Interrogation Session: 20180104082807
Implantable Lead Implant Date: 20081211
Implantable Lead Implant Date: 20081211
Implantable Lead Location: 753859
Implantable Lead Location: 753860
Implantable Pulse Generator Implant Date: 20081211
Lead Channel Impedance Value: 453 Ohm
Lead Channel Impedance Value: 525 Ohm
Lead Channel Pacing Threshold Amplitude: 1.25 V
Lead Channel Pacing Threshold Pulse Width: 0.4 ms
Lead Channel Sensing Intrinsic Amplitude: 0.6 mV
Lead Channel Sensing Intrinsic Amplitude: 12 mV
Lead Channel Setting Pacing Amplitude: 2 V
Lead Channel Setting Pacing Amplitude: 2.5 V
Lead Channel Setting Pacing Pulse Width: 0.4 ms
Lead Channel Setting Sensing Sensitivity: 2 mV
Pulse Gen Model: 5826
Pulse Gen Serial Number: 1999089

## 2016-07-06 MED ORDER — DILTIAZEM HCL ER COATED BEADS 360 MG PO CP24: 360.0000 mg | ORAL_CAPSULE | Freq: Every day | ORAL | 11 refills | Status: DC

## 2016-07-06 MED ORDER — LOSARTAN POTASSIUM 50 MG PO TABS: 50.0000 mg | ORAL_TABLET | Freq: Every day | ORAL | 11 refills | Status: DC

## 2016-07-06 NOTE — Progress Notes (Signed)
Pacemaker check in clinic. Normal device function. Thresholds, sensing, impedances consistent with previous measurements. Device programmed to maximize longevity. 13% AT/AF burden, +Pradaxa and Tikosyn. No episode triggers enabled. Device programmed at appropriate safety margins; RV output reprogrammed to 2.5V. Histogram distribution appropriate for patient activity level. Device programmed to optimize intrinsic conduction. Estimated longevity 5.75-9.75 years. Patient enrolled TTM's with [employer], but may plan to d/c TTMs. Patient education completed. ROV with GT in 12/2016.

## 2016-07-06 NOTE — Patient Instructions (Addendum)
Medication Instructions:  INCREASE Diltiazem to 360 mg once daily DECREASE Losartan (Cozaar) to 50 mg once daily   Labwork: Your physician recommends that you return for lab work on Friday January 26 at 11:00 on the same day as your nurse visit   Testing/Procedures: None Ordered   Follow-Up: Your physician recommends that you return for a Nurse visit/BP check on Friday January 26 at 11:00 am  Your physician recommends that you schedule a follow-up appointment in: 3 months with Dr. Acie Fredrickson  Your physician recommends that you schedule a follow-up appointment in: NEXT AVAILABLE WITH DR. Lovena Le   If you need a refill on your cardiac medications before your next appointment, please call your pharmacy.   Thank you for choosing [facility]! Christen Bame, RN [phone]

## 2016-07-06 NOTE — Progress Notes (Signed)
[name] Date of Birth  09-Nov-1945 [facility] [address]    [place], [place]  [address] [phone]  Fax  [phone]   Problem list: 1. Atrial fibrillation 2. Status post pacemaker implantation 3. Coronary artery disease-status post PTCA and stenting of her right coronary artery 4. Hyperlipidemia 5. Hypertension 6. Obstructive sleep apnea-currently on CPAP 7.   History of Present Illness:  [name] is a 71 year old female with a history of atrial fibrillation. She status post pacemaker implantation. She also has a history of coronary artery disease and is status post PTCA and stenting of the right coronary artery. She also has a history of hyperlipidemia, hypertension, and sleep apnea.  She uses a CPAP mask at night.  The CPAP mask has really helped her stay in normal sinus rhythm.  She complains of having some episodes of chest tightness. This occurs at times. One episode was relieved with sublingual nitroglycerin. She states that it is not nearly as severe as her previous episodes of angina.  January 13, 21014: She was hospitalized in November with severe shortness breath and "double pneumonia". She is slowly recovering.  She's looking forward to  getting back into her exercise program.  She has not been exercising at all.  She's had some leg edema.  She denies any chest pain or shortness breath.  She's has mild leg swelling for the past several months. She does not add salt any of her food. She tries to stay away from salty foods and processed meat.  September 18, 2013:  She remains short of breath  - especialy with exertion  Echo in Jan. 2014:   - Left ventricle: The cavity size was normal. Wall thickness was increased in a pattern of mild LVH. Systolic function was normal. The estimated ejection fraction was in the range of 60% to 65%. - Pulmonary arteries: PA peak pressure: 58mm Hg   October 03, 2013:  Level was seen about 2 weeks ago with some  increasing shortness of breath. She was noted to have moderate pulmonary hypertension on echo. She started on diltiazem. She has fairly well preserved left ventricle systolic function.  We started her on Cardizem XL 180 mg a day. She has not done well since that medication in addition. She's more short of breath. She's gained about 5 pounds.  She presents today for her echocardiogram. She is very short of breath and was worked into the schedule. Her O2 saturations at rest were 86%. We placed  oxygen on her and her O2 saturations increased above 90%. She is clearly more short of breath after the addition of the diltiazem.   She has not been eating any extra salt .  Has been taking her lasix.   She denies any chest pain - specifically was asked about pleuretic CP.  Had orthopnea until we placed the oxygen on her.   10/20/2013: [name] was admitted to the hospital with severe shortness breath when I saw her several weeks ago. She was found to be volume overload. She was aggressively diuresed and lost about 10 pounds of weight. Cardiac catheterization following that diuresis revealed only minimal coronary artery irregularities. Her left ventricular systolic function was normal. Her PA pressures were moderately elevated with an estimated PA pressure of around 57.   She was discharged on the same dose of Lasix that she was on at admission.   She is avoiding salt.    Dec. 3, 2015:  [name] is a 71 yo followed  for chronic diastolic CHf and pulmonary hypertension ( est. PA pressure of 70)  [name] has been back in the hospital since I last saw her.  She has  Increased her lasix which has helped.  She does a pretty good job of avoiding salt.   Sept. 27, 2016:  Is doing well.  Has some dyspnea - she thinks likely due to weight gain . No cardiac symptoms .  Needs to have back surgery .   October 06, 2015:  Overall doing well. Had her back surgery in Oct.  Wt. 267 lbs today   Jan. 4, 2018:  [name] is seen  back today following a recent hospitalization in Sept. 2017.   She was complaining of dyspnea , cough. She was cardioverted  She has diastolic dysfunction   She is now back in atrial fib. She new that she had converted back into a-fib.   HR are typically elevated when she is in atrial fib.   Has had some right flank pain .   Was found to have an atrophic right kidney.  Was in the hospital in [place], [place].   Has not tried a higher dose of Tikosyn.    Labs from her recent hospitalization on 06/24/2016 show a potassium level of 4.3. Her magnesium level was 2.0.  Current Outpatient Prescriptions on File Prior to Visit  Medication Sig Dispense Refill  . aspirin EC 81 MG tablet Take 81 mg by mouth at bedtime.     [name] atorvastatin (LIPITOR) 40 MG tablet TAKE 1 TABLET BY MOUTH EVERY DAY 90 tablet 2  . Cyanocobalamin (B-12) 5000 MCG CAPS Take 1 capsule by mouth daily.    [name] diltiazem (CARDIZEM CD) 180 MG 24 hr capsule TAKE 1 CAPSULE (180 MG TOTAL) BY MOUTH DAILY. 90 capsule 2  . dofetilide (TIKOSYN) 250 MCG capsule TAKE 1 CAPSULE (250 MCG TOTAL) BY MOUTH 2 (TWO) TIMES DAILY. 180 capsule 2  . FLUoxetine (PROZAC) 20 MG capsule TAKE ONE CAPSULE BY MOUTH EVERY MORNING 90 capsule 1  . furosemide (LASIX) 40 MG tablet 2 tablets (80mg ) by mouth in the AM and 1 tablet (40mg ) by mouth in the PM 360 tablet 0  . KLOR-CON M20 20 MEQ tablet TAKE 1 TABLET BY MOUTH TWICE A DAY 180 tablet 3  . losartan (COZAAR) 100 MG tablet Take 1 tablet (100 mg total) by mouth daily. 90 tablet 2  . Lysine HCl 1000 MG TABS Take 1 tablet by mouth at bedtime.     . metoprolol succinate (TOPROL-XL) 100 MG 24 hr tablet Take 1 tablet (100 mg total) by mouth 2 (two) times daily. Take with or immediately following a meal. 60 tablet 0  . metoprolol succinate (TOPROL-XL) 100 MG 24 hr tablet Take 1 tablet (100 mg total) by mouth 2 (two) times daily. 60 tablet 11  . Multiple Vitamin (MULTIVITAMIN WITH MINERALS) TABS tablet Take 1 tablet by mouth  daily. Centrum    . nitroGLYCERIN (NITROSTAT) 0.4 MG SL tablet Place 0.4 mg under the tongue every 5 (five) minutes x 3 doses as needed for chest pain.    Vladimir Faster Glycol-Propyl Glycol (SYSTANE OP) Place 1 drop into both eyes daily as needed (dry eyes).    [name] PRADAXA 150 MG CAPS capsule TAKE 1 CAPSULE BY MOUTH EVERY 12 HOURS 180 capsule 3  . PRESCRIPTION MEDICATION Inhale into the lungs at bedtime. CPAP    . triamcinolone cream (KENALOG) 0.1 % Apply 1 application topically 2 (two) times daily. 30 g  0   No current facility-administered medications on file prior to visit.     No Known Allergies  Past Medical History:  Diagnosis Date  . Anticoagulant long-term use    pradaxa  . Anxiety   . Arthritis    low back & both knees, Spondylolisthesis    . CAD (coronary artery disease) 6568,1275   post PTCA with bare-metal stenting to mid RCA in December 2004     . CHF (congestive heart failure) ([place])   . Chronic atrial fibrillation ([place]) 06/2007   Tachybradycardia pacemaker  . Chronic kidney disease    10% function - ?R, other kidney is compensating    . Depression   . Diplopia 06/19/2008   Qualifier: Diagnosis of  By: [name] MD, [name]   . Dysrhythmia    ATRIAL FIBRILATION  . Edema of lower extremity   . History of acute inferior wall MI   . History of CVA (cerebrovascular accident)   . Hyperlipidemia   . Hypertension   . Myocardial infarction 1700,1749  . OSA on CPAP    last test- 2010  . Pacemaker   . Pneumonia 2014   tx. ----  [facility]  . Shortness of breath   . Stroke ([place]) [id number]  . TIA (transient ischemic attack)   . Transfusion history   . Unspecified hemorrhoids without mention of complication 4/49/6759   Colonoscopy--Dr. Carlean Purl     Past Surgical History:  Procedure Laterality Date  . APPENDECTOMY    . back surgeries     x2-3 by Dr Trenton Gammon  . BACK SURGERY    . CHOLECYSTECTOMY  1994  . CORONARY ANGIOPLASTY WITH STENT PLACEMENT  1998  . CORONARY  STENT PLACEMENT     C stent  . DOPPLER ECHOCARDIOGRAPHY  2009  . ELECTROPHYSIOLOGIC STUDY N/A 03/31/2016   Procedure: Cardioversion;  Surgeon: Evans Lance, MD;  Location: [facility];  Service: Cardiovascular;  Laterality: N/A;  . LEFT AND RIGHT HEART CATHETERIZATION WITH CORONARY ANGIOGRAM N/A 10/06/2013   Procedure: LEFT AND RIGHT HEART CATHETERIZATION WITH CORONARY ANGIOGRAM;  Surgeon: Troy Sine, MD;  Location[facility] C[facility];  Service: Cardiovascular;  Laterality: N/A;  . permanent pacemaker  06/2007  . TOTAL ABDOMINAL HYSTERECTOMY  1984   2 surgeries endometriosis bso    History  Smoking Status  . Former Smoker  . Packs/day: 1.00  . Years: 5.00  . Types: Cigarettes  . Start date: 05/11/1978  . Quit date: 07/03/1982  Smokeless Tobacco  . Never Used    History  Alcohol Use  . Yes    Comment: once q 6 months-socailly-wine    Family History  Problem Relation Age of Onset  . Suicidality Father     suicide death pt was 3 yrs  . Arrhythmia Mother   . Hypertension Mother   . Diabetes Mother   . Heart attack Brother 31  . Heart disease Paternal Aunt   . Prostate cancer Maternal Grandfather   . Diabetes Paternal Grandfather     fathers side of the family    Reviw of Systems:  Reviewed in the HPI.  All other systems are negative.  Physical Exam: There were no vitals taken for this visit. The patient is alert and oriented x 3.     HEENT:   She has normal carotids.  no JVD,   Her mucous membranes are moist.  Lungs: Lung exam is clear.   Heart: Regular rate S1-S2.  No S3 heard  Abdomen:  Her abdomen is soft. She has good bowel sounds.  Extremities:  She has trace - 1+  ankle edema.  Neuro:  Her gait is normal. Her neuro exam is nonfocal.    ECG: Jan. 4, 2018:   Atrial fib with HR of 109.    Assessment / Plan:  1. Atrial fibrillation -   She is back in atrial fib.  HR is rapid.   Will increase Dilt to 360 mg a day  She may need to go back into the  hospital for up titration of her tikosyn.  Will have her see Dr. Lovena Le for further management.  Will see her in 3 months.   2. Status post pacemaker implantation 3. Coronary artery disease-status post PTCA and stenting of her right coronary artery - no angina   4. Hyperlipidemia -   Will check lipids at her next office visit  5. Hypertension -  BP is well controlled we will decrease the losartan to 50 mg a day since we are increasing the diltiazem to 360 mg a day. We'll have her return for a nurse visit in 2-3 weeks for an EKG, blood pressure check, and basic medical profile. 6. Obstructive sleep apnea-currently on CPAP 7. Spinal disc disease.       Mertie Moores, MD  07/06/2016 7:42 AM    [address],  [place], [place]  [address] Pager [phone] Phone: [phone]; Fax: [phone]

## 2016-07-11 ENCOUNTER — Encounter: Payer: Self-pay | Admitting: Cardiovascular Disease

## 2016-07-11 ENCOUNTER — Other Ambulatory Visit: Payer: Self-pay | Admitting: *Deleted

## 2016-07-11 MED ORDER — METOPROLOL SUCCINATE ER 100 MG PO TB24: 100.0000 mg | ORAL_TABLET | Freq: Two times a day (BID) | ORAL | 3 refills | Status: DC

## 2016-07-21 DIAGNOSIS — R1084 Generalized abdominal pain: Secondary | ICD-10-CM | POA: Diagnosis not present

## 2016-07-23 NOTE — Progress Notes (Signed)
Pre visit review using our clinic review tool, if applicable. No additional management support is needed unless otherwise documented below in the visit note.  Chief Complaint  Patient presents with  . Hospitalization Follow-up    HPI: [name] 71 y.o.  Comes in after hosp at [facility]  Where she was evaluated for acute right flank pain and noted to be in atrial fib. She had a full evaluation that was negative including troponins however her TSH was in the 4 .25 range was told to follow-up. No A1c was done. Saw  cardiology recently and is still in A. fib and to see Dr. Lovena Le soon. Her medicine was adjusted increased diltiazem decreased [place]. No bleeding. Mother may have had a problem with thyroid when she was younger and was taken iodine otherwise no history thyroid disease. Is trying to stay on the lower sugar diet to control the blood sugar.  ROS: See pertinent positives and negatives per HPI. No syncope change in exercise tolerance swelling. No bleeding. Probably just had a pulled muscle no hematuria  Past Medical History:  Diagnosis Date  . Anticoagulant long-term use    pradaxa  . Anxiety   . Arthritis    low back & both knees, Spondylolisthesis    . CAD (coronary artery disease) 8756,4332   post PTCA with bare-metal stenting to mid RCA in December 2004     . CHF (congestive heart failure) ([place])   . Chronic atrial fibrillation ([place]) 06/2007   Tachybradycardia pacemaker  . Chronic kidney disease    10% function - ?R, other kidney is compensating    . Depression   . Diplopia 06/19/2008   Qualifier: Diagnosis of  By: [name] MD, [name]   . Dysrhythmia    ATRIAL FIBRILATION  . Edema of lower extremity   . History of acute inferior wall MI   . History of CVA (cerebrovascular accident)   . Hyperlipidemia   . Hypertension   . Myocardial infarction 9518,8416  . OSA on CPAP    last test- 2010  . Pacemaker   . Pneumonia 2014   [facility]. ----  [facility]  .  Shortness of breath   . Stroke ([place]) [id number]  . TIA (transient ischemic attack)   . Transfusion history   . Unspecified hemorrhoids without mention of complication 12/07/3014   Colonoscopy--Dr. Carlean Purl     Family History  Problem Relation Age of Onset  . Suicidality Father     suicide death pt was 3 yrs  . Arrhythmia Mother   . Hypertension Mother   . Diabetes Mother   . Heart attack Brother 28  . Heart disease Paternal Aunt   . Prostate cancer Maternal Grandfather   . Diabetes Paternal Grandfather     fathers side of the family    Social History   Social History  . Marital status: Married    Spouse name: N/A  . Number of children: 0  . Years of education: HS   Occupational History  . retired     previously worked [employer]  . Smoking status: Former Smoker    Packs/day: 1.00    Years: 5.00    Types: Cigarettes    Start date: 05/11/1978    Quit date: 07/03/1982  . Smokeless tobacco: Never Used  . Alcohol use Yes     Comment: once q 6 months-socailly-wine  . Drug use: No  . Sexual activity: Not Currently   Other  Topics Concern  . 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   Married    Originally from [place] of two, high school education   Former smoker [phone] 1ppd   Hunting dogs 7    Retired from [employer] 2     Outpatient Medications Prior to Visit  Medication Sig Dispense Refill  . aspirin EC 81 MG tablet Take 81 mg by mouth at bedtime.     [name] atorvastatin (LIPITOR) 40 MG tablet TAKE 1 TABLET BY MOUTH EVERY DAY 90 tablet 2  . Cyanocobalamin (B-12) 5000 MCG CAPS Take 1 capsule by mouth daily.    [name] diltiazem (CARDIZEM CD) 360 MG 24 hr capsule Take 1 capsule (360 mg total) by mouth daily. 30 capsule 11  . dofetilide (TIKOSYN) 250 MCG capsule TAKE 1 CAPSULE (250 MCG TOTAL) BY MOUTH 2 (TWO) TIMES DAILY. 180 capsule 2  . FLUoxetine  (PROZAC) 20 MG capsule TAKE ONE CAPSULE BY MOUTH EVERY MORNING 90 capsule 1  . furosemide (LASIX) 40 MG tablet 2 tablets (80mg ) by mouth in the AM and 1 tablet (40mg ) by mouth in the PM 360 tablet 0  . KLOR-CON M20 20 MEQ tablet TAKE 1 TABLET BY MOUTH TWICE A DAY 180 tablet 3  . losartan (COZAAR) 50 MG tablet Take 1 tablet (50 mg total) by mouth daily. 30 tablet 11  . Lysine HCl 1000 MG TABS Take 1 tablet by mouth at bedtime.     . metoprolol succinate (TOPROL-XL) 100 MG 24 hr tablet Take 1 tablet (100 mg total) by mouth 2 (two) times daily. 180 tablet 3  . Multiple Vitamin (MULTIVITAMIN WITH MINERALS) TABS tablet Take 1 tablet by mouth daily. Centrum    . nitroGLYCERIN (NITROSTAT) 0.4 MG SL tablet Place 0.4 mg under the tongue every 5 (five) minutes x 3 doses as needed for chest pain.    Vladimir Faster Glycol-Propyl Glycol (SYSTANE OP) Place 1 drop into both eyes daily as needed (dry eyes).    [name] PRADAXA 150 MG CAPS capsule TAKE 1 CAPSULE BY MOUTH EVERY 12 HOURS 180 capsule 3  . PRESCRIPTION MEDICATION Inhale into the lungs at bedtime. CPAP    . triamcinolone cream (KENALOG) 0.1 % Apply 1 application topically 2 (two) times daily. 30 g 0   No facility-administered medications prior to visit.      EXAM:  BP 116/80 (BP Location: Right Arm, Patient Position: Sitting, Cuff Size: Large)   Temp 97.9 F (36.6 C) (Oral)   Wt 262 lb (118.8 kg)   BMI 41.65 kg/m   Body mass index is 41.65 kg/m.  GENERAL: vitals reviewed and listed above, alert, oriented, appears well hydrated and in no acute distress HEENT: atraumatic, conjunctiva  clear, no obvious abnormalities on inspection of external nose and ears  NECK: no obvious masses on inspection palpation   Thyroid palp  No obv nodules  LUNGS: clear to auscultation bilaterally, no wheezes, rales or rhonchi, CV: HRRIR,  Rate about 90 no clubbing cyanosis or  peripheral edema nl cap refill  MS: moves all extremities without noticeable focal   abnormality PSYCH: pleasant and cooperative, no obvious depression or anxiety  Wt Readings from Last 3 Encounters:  07/24/16 262 lb (118.8 kg)  07/06/16 262 lb 9.6 oz (119.1 kg)  04/18/16 262 lb 9.6 oz (119.1 kg)   BP Readings from Last 3 Encounters:  07/24/16 116/80  07/06/16 128/60  04/18/16 124/70    ASSESSMENT AND PLAN:  Discussed the following assessment and plan:  Elevated TSH - Discussed meaning of test repeat was free T4 expectant management may need just to be followed. Want to avoid hyperthyroid state - Plan: TSH, T4, free, Basic metabolic panel, Hemoglobin A1c  Pre-diabetes early DM  - check a1c today  - Plan: TSH, T4, free, Basic metabolic panel, Hemoglobin A1c  Anticoagulant long-term use  Chronic diastolic heart failure- grade 2 diastolic dysfunction by echo 10/03/13 - Plan: TSH, T4, free, Basic metabolic panel, Hemoglobin A1c  Hospital discharge follow-up Hospital labs discharge summary reviewed to scan. -Patient advised to return or notify health care team  if symptoms worsen ,persist or new concerns arise.  Patient Instructions  Checking thyroid test and blood sugar control . Will notify you  of labs when available.   Sometimes labs are off just because of illness.   Will follow and decide on fu depnding on results  Or April as planned     Wanda K. [name] M.D.  Lab Results  Component Value Date   WBC 10.1 04/01/2016   HGB 13.7 04/01/2016   HCT 43.1 04/01/2016   PLT 340 04/01/2016   GLUCOSE 133 (H) 07/24/2016   CHOL 100 03/31/2016   TRIG 143 03/31/2016   HDL 22 (L) 03/31/2016   LDLCALC 49 03/31/2016   ALT 53 04/01/2016   AST 33 04/01/2016   NA 137 07/24/2016   K 4.0 07/24/2016   CL 100 07/24/2016   CREATININE 0.92 07/24/2016   BUN 18 07/24/2016   CO2 27 07/24/2016   TSH 3.56 07/24/2016   INR 1.30 10/06/2013   HGBA1C 6.9 (H) 07/24/2016

## 2016-07-24 ENCOUNTER — Encounter: Payer: Self-pay | Admitting: Internal Medicine

## 2016-07-24 ENCOUNTER — Ambulatory Visit (INDEPENDENT_AMBULATORY_CARE_PROVIDER_SITE_OTHER): Payer: PPO | Admitting: Internal Medicine

## 2016-07-24 VITALS — BP 116/80 | Temp 97.9°F | Wt 262.0 lb

## 2016-07-24 DIAGNOSIS — Z09 Encounter for follow-up examination after completed treatment for conditions other than malignant neoplasm: Secondary | ICD-10-CM

## 2016-07-24 DIAGNOSIS — R7989 Other specified abnormal findings of blood chemistry: Secondary | ICD-10-CM

## 2016-07-24 DIAGNOSIS — R7303 Prediabetes: Secondary | ICD-10-CM

## 2016-07-24 DIAGNOSIS — R946 Abnormal results of thyroid function studies: Secondary | ICD-10-CM

## 2016-07-24 DIAGNOSIS — I5032 Chronic diastolic (congestive) heart failure: Secondary | ICD-10-CM | POA: Diagnosis not present

## 2016-07-24 DIAGNOSIS — Z7901 Long term (current) use of anticoagulants: Secondary | ICD-10-CM

## 2016-07-24 LAB — BASIC METABOLIC PANEL
BUN: 18 mg/dL (ref 6–23)
CO2: 27 mEq/L (ref 19–32)
Calcium: 9.2 mg/dL (ref 8.4–10.5)
Chloride: 100 mEq/L (ref 96–112)
Creatinine, Ser: 0.92 mg/dL (ref 0.40–1.20)
GFR: 64.06 mL/min (ref >60.00)
Glucose, Bld: 133 mg/dL — ABNORMAL HIGH (ref 70–99)
Potassium: 4 mEq/L (ref 3.5–5.1)
Sodium: 137 mEq/L (ref 135–145)

## 2016-07-24 LAB — TSH: TSH: 3.56 u[IU]/mL (ref 0.35–4.50)

## 2016-07-24 LAB — T4, FREE: Free T4: 0.82 ng/dL (ref 0.60–1.60)

## 2016-07-24 LAB — HEMOGLOBIN A1C: Hgb A1c MFr Bld: 6.9 % — ABNORMAL HIGH (ref 4.6–6.5)

## 2016-07-24 NOTE — Patient Instructions (Addendum)
Checking thyroid test and blood sugar control . Will notify you  of labs when available.   Sometimes labs are off just because of illness.   Will follow and decide on fu depending on results  Or April as planned

## 2016-07-25 DIAGNOSIS — M1712 Unilateral primary osteoarthritis, left knee: Secondary | ICD-10-CM | POA: Diagnosis not present

## 2016-07-28 ENCOUNTER — Ambulatory Visit (INDEPENDENT_AMBULATORY_CARE_PROVIDER_SITE_OTHER): Payer: PPO | Admitting: Nurse Practitioner

## 2016-07-28 ENCOUNTER — Other Ambulatory Visit: Payer: PPO | Admitting: *Deleted

## 2016-07-28 VITALS — BP 130/82 | HR 104 | Ht 66.5 in | Wt 257.0 lb

## 2016-07-28 DIAGNOSIS — I4891 Unspecified atrial fibrillation: Secondary | ICD-10-CM | POA: Diagnosis not present

## 2016-07-28 DIAGNOSIS — I10 Essential (primary) hypertension: Secondary | ICD-10-CM | POA: Diagnosis not present

## 2016-07-28 DIAGNOSIS — I480 Paroxysmal atrial fibrillation: Secondary | ICD-10-CM | POA: Diagnosis not present

## 2016-07-28 DIAGNOSIS — I272 Pulmonary hypertension, unspecified: Secondary | ICD-10-CM | POA: Diagnosis not present

## 2016-07-28 DIAGNOSIS — I5032 Chronic diastolic (congestive) heart failure: Secondary | ICD-10-CM | POA: Diagnosis not present

## 2016-07-28 MED ORDER — NITROGLYCERIN 0.4 MG SL SUBL: 0.4000 mg | SUBLINGUAL_TABLET | SUBLINGUAL | 6 refills | Status: DC | PRN

## 2016-07-28 NOTE — Patient Instructions (Signed)
Medication Instructions:  Your physician recommends that you continue on your current medications as directed. Please refer to the Current Medication list given to you today.   Labwork: TODAY - basic metabolic panel   Testing/Procedures: None Ordered   Follow-Up: Keep your appointments with Dr. Lovena Le and Dr. Acie Fredrickson   If you need a refill on your cardiac medications before your next appointment, please call your pharmacy.   Thank you for choosing [facility]! Christen Bame, RN [phone]

## 2016-07-28 NOTE — Progress Notes (Signed)
1.) Reason for visit: Patient returns for recheck of heart rate and BP since increasing diltiazem to 360 mg and decreasing losartan to 50 mg on 07/06/16  2.) Name of MD requesting visit: Dr. Acie Fredrickson  3.) H&P: Hx PAF, recently seen in the office for follow-up with Dr. Acie Fredrickson and was found to be in afib; states she believes she has been in afib since that time  4.) ROS related to problem: patient states she is doing well, no specific complaints.  Pulse is irregular and fast at rate of 104 bpm.   5.) Assessment and plan per MD: Per Dr. Acie Fredrickson, will defer decision on medication changes to Dr. Lovena Le since patient is tolerating a fib well and has an appointment on 08/02/16  Checking bmet and Mg level today

## 2016-07-29 LAB — BASIC METABOLIC PANEL
BUN/Creatinine Ratio: 26 (ref 12–28)
BUN: 28 mg/dL — ABNORMAL HIGH (ref 8–27)
CO2: 22 mmol/L (ref 18–29)
Calcium: 9.7 mg/dL (ref 8.7–10.3)
Chloride: 95 mmol/L — ABNORMAL LOW (ref 96–106)
Creatinine, Ser: 1.07 mg/dL — ABNORMAL HIGH (ref 0.57–1.00)
GFR calc Af Amer: 61 mL/min/{1.73_m2} (ref >59)
GFR calc non Af Amer: 53 mL/min/{1.73_m2} — ABNORMAL LOW (ref >59)
Glucose: 120 mg/dL — ABNORMAL HIGH (ref 65–99)
Potassium: 4.2 mmol/L (ref 3.5–5.2)
Sodium: 140 mmol/L (ref 134–144)

## 2016-07-29 LAB — MAGNESIUM: Magnesium: 2.3 mg/dL (ref 1.6–2.3)

## 2016-08-02 ENCOUNTER — Ambulatory Visit (INDEPENDENT_AMBULATORY_CARE_PROVIDER_SITE_OTHER): Payer: PPO | Admitting: Internal Medicine

## 2016-08-02 ENCOUNTER — Encounter: Payer: Self-pay | Admitting: Internal Medicine

## 2016-08-02 VITALS — BP 112/78 | HR 97 | Ht 66.6 in | Wt 258.2 lb

## 2016-08-02 DIAGNOSIS — I495 Sick sinus syndrome: Secondary | ICD-10-CM | POA: Diagnosis not present

## 2016-08-02 DIAGNOSIS — I480 Paroxysmal atrial fibrillation: Secondary | ICD-10-CM

## 2016-08-02 DIAGNOSIS — Z950 Presence of cardiac pacemaker: Secondary | ICD-10-CM

## 2016-08-02 DIAGNOSIS — Z01812 Encounter for preprocedural laboratory examination: Secondary | ICD-10-CM

## 2016-08-02 LAB — CUP PACEART INCLINIC DEVICE CHECK
Battery Impedance: 1300 Ohm
Battery Voltage: 2.79 V
Date Time Interrogation Session: 20180131170538
Implantable Lead Implant Date: 20081211
Implantable Lead Implant Date: 20081211
Implantable Lead Location: 753859
Implantable Lead Location: 753860
Implantable Pulse Generator Implant Date: 20081211
Lead Channel Impedance Value: 472 Ohm
Lead Channel Impedance Value: 561 Ohm
Lead Channel Pacing Threshold Amplitude: 1.5 V
Lead Channel Pacing Threshold Pulse Width: 0.4 ms
Lead Channel Sensing Intrinsic Amplitude: 0.4 mV
Lead Channel Sensing Intrinsic Amplitude: 12 mV
Lead Channel Setting Pacing Amplitude: 2 V
Lead Channel Setting Pacing Amplitude: 2.5 V
Lead Channel Setting Pacing Pulse Width: 0.5 ms
Lead Channel Setting Sensing Sensitivity: 2 mV
Pulse Gen Model: 5826
Pulse Gen Serial Number: 1999089

## 2016-08-02 NOTE — Patient Instructions (Addendum)
Medication Instructions:  Your physician recommends that you continue on your current medications as directed. Please refer to the Current Medication list given to you today.   Labwork: TODAY or TOMORROW: BMET / CBC w/ diff / INR   Testing/Procedures: Cardioversion   Follow-Up: Your physician recommends that you schedule a follow-up appointment in: 4 week with Dr. Curt Bears    Your physician wants you to follow-up in: 6 months with [facility] (TBD with Dr. Lovena Le). You will receive a reminder letter in the mail two months in advance. If you don't receive a letter, please call our office to schedule the follow-up appointment.     Any Other Special Instructions Will Be Listed Below (If Applicable).  Please report to the [facility] on 08/04/16 at 7:30 AM  Nothing to eat or drink after midnight prior to procedure  May take medication with a sip of water day of procedure  You will need someone to drive you home after the procedure    If you need a refill on your cardiac medications before your next appointment, please call your pharmacy.

## 2016-08-02 NOTE — Progress Notes (Signed)
HPI [name] returns today for followup. She is a very pleasant 71 year old woman with coronary artery disease, stroke, paroxysmal atrial fibrillation, symptomatic tachycardia bradycardia syndrome, status post permanent pacemaker insertion. With a combination of CPAP and dofetilide, she had maintaining sinus rhythm very nicely until this past summer when she developed an upper airway infection and was cardioverted back to NSR. She then redeveloped atrial fib about 2 months ago. She does not feel well. She has class 3 sob which is multifactorial. She has known COPD and sleep apnea.  No Known Allergies   Current Outpatient Prescriptions  Medication Sig Dispense Refill  . acetaminophen (TYLENOL) 500 MG tablet Take 500 mg by mouth every 12 (twelve) hours as needed. Knee Pain    . aspirin EC 81 MG tablet Take 81 mg by mouth at bedtime.     [name] atorvastatin (LIPITOR) 40 MG tablet TAKE 1 TABLET BY MOUTH EVERY DAY 90 tablet 2  . Cyanocobalamin (B-12) 5000 MCG CAPS Take 1 capsule by mouth daily.    [name] diltiazem (CARDIZEM CD) 360 MG 24 hr capsule Take 1 capsule (360 mg total) by mouth daily. 30 capsule 11  . dofetilide (TIKOSYN) 250 MCG capsule TAKE 1 CAPSULE (250 MCG TOTAL) BY MOUTH 2 (TWO) TIMES DAILY. 180 capsule 2  . FLUoxetine (PROZAC) 20 MG capsule TAKE ONE CAPSULE BY MOUTH EVERY MORNING 90 capsule 1  . furosemide (LASIX) 40 MG tablet 2 tablets (80mg ) by mouth in the AM and 1 tablet (40mg ) by mouth in the PM 360 tablet 0  . KLOR-CON M20 20 MEQ tablet TAKE 1 TABLET BY MOUTH TWICE A DAY 180 tablet 3  . losartan (COZAAR) 50 MG tablet Take 1 tablet (50 mg total) by mouth daily. 30 tablet 11  . Lysine HCl 1000 MG TABS Take 1 tablet by mouth at bedtime.     . metoprolol succinate (TOPROL-XL) 100 MG 24 hr tablet Take 1 tablet (100 mg total) by mouth 2 (two) times daily. 180 tablet 3  . Multiple Vitamin (MULTIVITAMIN WITH MINERALS) TABS tablet Take 1 tablet by mouth daily. Centrum    . nitroGLYCERIN  (NITROSTAT) 0.4 MG SL tablet Place 1 tablet (0.4 mg total) under the tongue every 5 (five) minutes x 3 doses as needed for chest pain. 25 tablet 6  . Polyethyl Glycol-Propyl Glycol (SYSTANE OP) Place 1 drop into both eyes daily as needed (dry eyes).    [name] PRADAXA 150 MG CAPS capsule TAKE 1 CAPSULE BY MOUTH EVERY 12 HOURS 180 capsule 3  . PRESCRIPTION MEDICATION Inhale into the lungs at bedtime. CPAP     No current facility-administered medications for this visit.      Past Medical History:  Diagnosis Date  . Anticoagulant long-term use    pradaxa  . Anxiety   . Arthritis    low back & both knees, Spondylolisthesis    . CAD (coronary artery disease) 5188,4166   post PTCA with bare-metal stenting to mid RCA in December 2004     . CHF (congestive heart failure) ([place])   . Chronic atrial fibrillation ([place]) 06/2007   Tachybradycardia pacemaker  . Chronic kidney disease    10% function - ?R, other kidney is compensating    . Depression   . Diplopia 06/19/2008   Qualifier: Diagnosis of  By: [name] MD, [name]   . Dysrhythmia    ATRIAL FIBRILATION  . Edema of lower extremity   . History of acute inferior wall MI   . History of  CVA (cerebrovascular accident)   . Hyperlipidemia   . Hypertension   . Myocardial infarction [id number]  . OSA on CPAP    last test- 2010  . Pacemaker   . Pneumonia 2014   tx. ----  [facility]  . Shortness of breath   . Stroke ([place]) [id number]  . TIA (transient ischemic attack)   . Transfusion history   . Unspecified hemorrhoids without mention of complication 8/54/6270   Colonoscopy--Dr. Carlean Purl     ROS:   All systems reviewed and negative except as noted in the HPI.   Past Surgical History:  Procedure Laterality Date  . APPENDECTOMY    . back surgeries     x2-3 by Dr Trenton Gammon  . BACK SURGERY    . CHOLECYSTECTOMY  1994  . CORONARY ANGIOPLASTY WITH STENT PLACEMENT  1998  . CORONARY STENT PLACEMENT     C stent  . DOPPLER ECHOCARDIOGRAPHY   2009  . ELECTROPHYSIOLOGIC STUDY N/A 03/31/2016   Procedure: Cardioversion;  Surgeon: Evans Lance, MD;  Location: [place] [facility];  Service: Cardiovascular;  Laterality: N/A;  . LEFT AND RIGHT HEART CATHETERIZATION WITH CORONARY ANGIOGRAM N/A 10/06/2013   Procedure: LEFT AND RIGHT HEART CATHETERIZATION WITH CORONARY ANGIOGRAM;  Surgeon: Troy Sine, MD;  [facility];  Service: Cardiovascular;  Laterality: N/A;  . permanent pacemaker  06/2007  . TOTAL ABDOMINAL HYSTERECTOMY  1984   2 surgeries endometriosis bso     Family History  Problem Relation Age of Onset  . Suicidality Father     suicide death pt was 3 yrs  . Arrhythmia Mother   . Hypertension Mother   . Diabetes Mother   . Heart attack Brother 10  . Heart disease Paternal Aunt   . Prostate cancer Maternal Grandfather   . Diabetes Paternal Grandfather     fathers side of the family     Social History   Social History  . Marital status: Married    Spouse name: N/A  . Number of children: 0  . Years of education: HS   Occupational History  . retired     previously worked [employer]  . Smoking status: Former Smoker    Packs/day: 1.00    Years: 5.00    Types: Cigarettes    Start date: 05/11/1978    Quit date: 07/03/1982  . Smokeless tobacco: Never Used  . Alcohol use Yes     Comment: once q 6 months-socailly-wine  . Drug use: No  . Sexual activity: Not Currently   Other Topics Concern  . 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   Married    Originally from [employer] of two, high school education   Former smoker [phone] 1ppd   Hunting dogs 7    Retired from [employer] 2      BP 112/78   Pulse 97   Ht 5' 6.6" (1.692 m)   Wt 258 lb 3.2 oz (117.1 kg)   BMI 40.93 kg/m   Physical Exam:  Obese appearing 71 yo woman, NAD HEENT: Unremarkable Neck:  7 cm JVD,  no thyromegally Lungs:  Clear with no wheezes, rales, or rhonchi. HEART:  Regular rate rhythm, no murmurs, no rubs, no clicks Abd:  soft, positive bowel sounds, no organomegally, no rebound, no guarding Ext:  2 plus pulses,  1+ peripheral edema, no cyanosis, no clubbing Skin:  No rashes no nodules Neuro:  CN II through XII intact, motor grossly intact  DEVICE  Normal device function.  See PaceArt for details.   Assess/Plan:  1. PAF - she is in atrial fib with a controlled VR. She does not feel well. We discussed the treatment options in detail. I have recommended repeat DCCV and referral for consideration for atrial fib ablation. If she does not hold NSR, I would anticipate a brief course of amiodarone/ablation/amiodarone and then stopping amiodarone. 2. HTN - her blood pressure is fairly well controlled.  3. PPM - her [employer] DDD PM is working normally. Will recheck in several months. 4. Obesity - she is encouraged to lose weight 5. Chronic diastolic heart failure - she has a little fluid excess. She is encouraged to maintain a low sodium diet.  Mikle Bosworth.D.

## 2016-08-03 LAB — CBC WITH DIFFERENTIAL/PLATELET
Basophils Absolute: 0.1 10*3/uL (ref 0.0–0.2)
Basos: 1 %
EOS (ABSOLUTE): 0.3 10*3/uL (ref 0.0–0.4)
Eos: 2 %
Hematocrit: 43.7 % (ref 34.0–46.6)
Hemoglobin: 14.6 g/dL (ref 11.1–15.9)
Immature Grans (Abs): 0 10*3/uL (ref 0.0–0.1)
Immature Granulocytes: 0 %
Lymphocytes Absolute: 3.3 10*3/uL — ABNORMAL HIGH (ref 0.7–3.1)
Lymphs: 25 %
MCH: 31.5 pg (ref 26.6–33.0)
MCHC: 33.4 g/dL (ref 31.5–35.7)
MCV: 94 fL (ref 79–97)
Monocytes Absolute: 1.3 10*3/uL — ABNORMAL HIGH (ref 0.1–0.9)
Monocytes: 10 %
Neutrophils Absolute: 8.1 10*3/uL — ABNORMAL HIGH (ref 1.4–7.0)
Neutrophils: 62 %
Platelets: 327 10*3/uL (ref 150–379)
RBC: 4.64 x10E6/uL (ref 3.77–5.28)
RDW: 13.1 % (ref 12.3–15.4)
WBC: 13.1 10*3/uL — ABNORMAL HIGH (ref 3.4–10.8)

## 2016-08-03 LAB — BASIC METABOLIC PANEL
BUN/Creatinine Ratio: 28 (ref 12–28)
BUN: 26 mg/dL (ref 8–27)
CO2: 25 mmol/L (ref 18–29)
Calcium: 9.2 mg/dL (ref 8.7–10.3)
Chloride: 98 mmol/L (ref 96–106)
Creatinine, Ser: 0.94 mg/dL (ref 0.57–1.00)
GFR calc Af Amer: 71 mL/min/{1.73_m2} (ref >59)
GFR calc non Af Amer: 62 mL/min/{1.73_m2} (ref >59)
Glucose: 106 mg/dL — ABNORMAL HIGH (ref 65–99)
Potassium: 4.9 mmol/L (ref 3.5–5.2)
Sodium: 142 mmol/L (ref 134–144)

## 2016-08-03 LAB — PROTIME-INR
INR: 1.1 (ref 0.8–1.2)
Prothrombin Time: 11.5 s (ref 9.1–12.0)

## 2016-08-04 ENCOUNTER — Encounter (HOSPITAL_COMMUNITY): Payer: Self-pay | Admitting: Internal Medicine

## 2016-08-04 ENCOUNTER — Ambulatory Visit (HOSPITAL_COMMUNITY)
Admission: RE | Admit: 2016-08-04 | Discharge: 2016-08-04 | Disposition: A | Discharge status: Home / Self Care | Payer: PPO | Source: Ambulatory Visit | Attending: Internal Medicine | Admitting: Internal Medicine

## 2016-08-04 ENCOUNTER — Encounter (HOSPITAL_COMMUNITY)
Admission: RE | Disposition: A | Discharge status: Home / Self Care | Payer: Self-pay | Source: Ambulatory Visit | Attending: Internal Medicine

## 2016-08-04 DIAGNOSIS — Z955 Presence of coronary angioplasty implant and graft: Secondary | ICD-10-CM | POA: Diagnosis not present

## 2016-08-04 DIAGNOSIS — E785 Hyperlipidemia, unspecified: Secondary | ICD-10-CM | POA: Diagnosis not present

## 2016-08-04 DIAGNOSIS — G4733 Obstructive sleep apnea (adult) (pediatric): Secondary | ICD-10-CM | POA: Diagnosis not present

## 2016-08-04 DIAGNOSIS — J449 Chronic obstructive pulmonary disease, unspecified: Secondary | ICD-10-CM | POA: Insufficient documentation

## 2016-08-04 DIAGNOSIS — E669 Obesity, unspecified: Secondary | ICD-10-CM | POA: Insufficient documentation

## 2016-08-04 DIAGNOSIS — Z8042 Family history of malignant neoplasm of prostate: Secondary | ICD-10-CM | POA: Insufficient documentation

## 2016-08-04 DIAGNOSIS — F329 Major depressive disorder, single episode, unspecified: Secondary | ICD-10-CM | POA: Insufficient documentation

## 2016-08-04 DIAGNOSIS — Z6841 Body Mass Index (BMI) 40.0 and over, adult: Secondary | ICD-10-CM | POA: Insufficient documentation

## 2016-08-04 DIAGNOSIS — I252 Old myocardial infarction: Secondary | ICD-10-CM | POA: Insufficient documentation

## 2016-08-04 DIAGNOSIS — Z950 Presence of cardiac pacemaker: Secondary | ICD-10-CM | POA: Insufficient documentation

## 2016-08-04 DIAGNOSIS — Z833 Family history of diabetes mellitus: Secondary | ICD-10-CM | POA: Insufficient documentation

## 2016-08-04 DIAGNOSIS — Z7982 Long term (current) use of aspirin: Secondary | ICD-10-CM | POA: Insufficient documentation

## 2016-08-04 DIAGNOSIS — M479 Spondylosis, unspecified: Secondary | ICD-10-CM | POA: Diagnosis not present

## 2016-08-04 DIAGNOSIS — N189 Chronic kidney disease, unspecified: Secondary | ICD-10-CM | POA: Insufficient documentation

## 2016-08-04 DIAGNOSIS — Z7901 Long term (current) use of anticoagulants: Secondary | ICD-10-CM | POA: Diagnosis not present

## 2016-08-04 DIAGNOSIS — Z8249 Family history of ischemic heart disease and other diseases of the circulatory system: Secondary | ICD-10-CM | POA: Diagnosis not present

## 2016-08-04 DIAGNOSIS — I480 Paroxysmal atrial fibrillation: Secondary | ICD-10-CM | POA: Diagnosis present

## 2016-08-04 DIAGNOSIS — Z87891 Personal history of nicotine dependence: Secondary | ICD-10-CM | POA: Diagnosis not present

## 2016-08-04 DIAGNOSIS — M170 Bilateral primary osteoarthritis of knee: Secondary | ICD-10-CM | POA: Insufficient documentation

## 2016-08-04 DIAGNOSIS — Z8673 Personal history of transient ischemic attack (TIA), and cerebral infarction without residual deficits: Secondary | ICD-10-CM | POA: Diagnosis not present

## 2016-08-04 DIAGNOSIS — I130 Hypertensive heart and chronic kidney disease with heart failure and stage 1 through stage 4 chronic kidney disease, or unspecified chronic kidney disease: Secondary | ICD-10-CM | POA: Diagnosis not present

## 2016-08-04 DIAGNOSIS — I2510 Atherosclerotic heart disease of native coronary artery without angina pectoris: Secondary | ICD-10-CM | POA: Insufficient documentation

## 2016-08-04 DIAGNOSIS — F419 Anxiety disorder, unspecified: Secondary | ICD-10-CM | POA: Diagnosis not present

## 2016-08-04 DIAGNOSIS — I5032 Chronic diastolic (congestive) heart failure: Secondary | ICD-10-CM | POA: Diagnosis not present

## 2016-08-04 DIAGNOSIS — I481 Persistent atrial fibrillation: Secondary | ICD-10-CM | POA: Diagnosis not present

## 2016-08-04 HISTORY — PX: ELECTROPHYSIOLOGIC STUDY: SHX172A

## 2016-08-04 SURGERY — CARDIOVERSION (CATH LAB)
Anesthesia: LOCAL

## 2016-08-04 MED ORDER — SODIUM CHLORIDE 0.9 % IV SOLN: 250.0000 mL | INTRAVENOUS | Status: DC | PRN

## 2016-08-04 MED ORDER — ONDANSETRON HCL 4 MG/2ML IJ SOLN: 4.0000 mg | Freq: Four times a day (QID) | INTRAMUSCULAR | Status: DC | PRN

## 2016-08-04 MED ORDER — MIDAZOLAM HCL 5 MG/5ML IJ SOLN
INTRAMUSCULAR | Status: AC
  Filled 2016-08-04: qty 5

## 2016-08-04 MED ORDER — ACETAMINOPHEN 325 MG PO TABS
650.0000 mg | ORAL_TABLET | ORAL | Status: DC | PRN
  Filled 2016-08-04: qty 2

## 2016-08-04 MED ORDER — SODIUM CHLORIDE 0.9% FLUSH: 3.0000 mL | INTRAVENOUS | Status: DC | PRN

## 2016-08-04 MED ORDER — SODIUM CHLORIDE 0.9% FLUSH: 3.0000 mL | Freq: Two times a day (BID) | INTRAVENOUS | Status: DC

## 2016-08-04 MED ORDER — AMIODARONE HCL 200 MG PO TABS: 200.0000 mg | ORAL_TABLET | Freq: Two times a day (BID) | ORAL | 4 refills | Status: DC

## 2016-08-04 MED ORDER — FENTANYL CITRATE (PF) 100 MCG/2ML IJ SOLN
INTRAMUSCULAR | Status: AC
  Filled 2016-08-04: qty 2

## 2016-08-04 MED ORDER — SODIUM CHLORIDE 0.9 % IV SOLN
INTRAVENOUS | Status: DC
  Administered 2016-08-04: 08:00:00 via INTRAVENOUS

## 2016-08-04 MED ORDER — FENTANYL CITRATE (PF) 100 MCG/2ML IJ SOLN
INTRAMUSCULAR | Status: DC | PRN
  Administered 2016-08-04 (×3): 25 ug via INTRAVENOUS

## 2016-08-04 MED ORDER — MIDAZOLAM HCL 5 MG/5ML IJ SOLN
INTRAMUSCULAR | Status: DC | PRN
  Administered 2016-08-04 (×2): 2 mg via INTRAVENOUS
  Administered 2016-08-04 (×2): 1 mg via INTRAVENOUS
  Administered 2016-08-04: 2 mg via INTRAVENOUS

## 2016-08-04 SURGICAL SUPPLY — 1 items: PAD DEFIB LIFELINK (PAD) ×2 IMPLANT

## 2016-08-04 NOTE — Discharge Instructions (Signed)
Electrical Cardioversion, Care After °This sheet gives you information about how to care for yourself after your procedure. Your health care provider may also give you more specific instructions. If you have problems or questions, contact your health care provider. °What can I expect after the procedure? °After the procedure, it is common to have: °· Some redness on the skin where the shocks were given. °Follow these instructions at home: °· Do not drive for 24 hours if you were given a medicine to help you relax (sedative). °· Take over-the-counter and prescription medicines only as told by your health care provider. °· Ask your health care provider how to check your pulse. Check it often. °· Rest for 48 hours after the procedure or as told by your health care provider. °· Avoid or limit your caffeine use as told by your health care provider. °Contact a health care provider if: °· You feel like your heart is beating too quickly or your pulse is not regular. °· You have a serious muscle cramp that does not go away. °Get help right away if: °· You have discomfort in your chest. °· You are dizzy or you feel faint. °· You have trouble breathing or you are short of breath. °· Your speech is slurred. °· You have trouble moving an arm or leg on one side of your body. °· Your fingers or toes turn cold or blue. °This information is not intended to replace advice given to you by your health care provider. Make sure you discuss any questions you have with your health care provider. °Document Released: 04/09/2013 Document Revised: 01/21/2016 Document Reviewed: 12/24/2015 °Elsevier Interactive Patient Education © 2017 [employer] Inc. ° °

## 2016-08-07 ENCOUNTER — Other Ambulatory Visit: Payer: Self-pay | Admitting: Nurse Practitioner

## 2016-08-07 ENCOUNTER — Telehealth: Payer: Self-pay | Admitting: Nurse Practitioner

## 2016-08-07 ENCOUNTER — Telehealth: Payer: Self-pay | Admitting: Cardiology

## 2016-08-07 MED ORDER — APIXABAN 5 MG PO TABS: 5.0000 mg | ORAL_TABLET | Freq: Two times a day (BID) | ORAL | 6 refills | Status: DC

## 2016-08-07 MED ORDER — APIXABAN 2.5 MG PO TABS: 5.0000 mg | ORAL_TABLET | Freq: Two times a day (BID) | ORAL | Status: DC

## 2016-08-07 NOTE — Telephone Encounter (Signed)
Informed pt to stop Pradaxa and ASA, start Eliquis 5mg  BID. She also tells me she is still taking Tikosyn.  Reviewed with Dr. Lovena Le - advised pt to stop Tikosyn now, will start amiodarone on Friday. Will review more of this tomorrow in office. Patient verbalized understanding and agreeable to plan.

## 2016-08-07 NOTE — Telephone Encounter (Signed)
Pharmacy called regarding interaction between Pradaxa and amiodarone. Discussed with Dr Lovena Le. Pradaxa changed to Eliquis.  New Rx sent in. Pt aware and agrees with plan.   Chanetta Marshall, NP 08/07/2016 10:24 AM

## 2016-08-07 NOTE — Telephone Encounter (Signed)
F/U Call:  [name] is calling because she is trying to get her prescription filled for Eliquis and the pharmacy stated that they did not have the prescription from [place]. Patient is requesting we contact [facility] with prescription.  [address], [place], [place] [address] [phone]. Patient is also requesting a call back with follow up on the matter. Thanks.

## 2016-08-07 NOTE — H&P (Signed)
HPI [name] returns today for followup. She is a very pleasant 71 year old woman with coronary artery disease, stroke, paroxysmal atrial fibrillation, symptomatic tachycardia bradycardia syndrome, status post permanent pacemaker insertion. With a combination of CPAP and dofetilide, she had maintaining sinus rhythm very nicely until this past summer when she developed an upper airway infection and was cardioverted back to NSR. She then redeveloped atrial fib about 2 months ago. She does not feel well. She has class 3 sob which is multifactorial. She has known COPD and sleep apnea.  No Known Allergies         Current Outpatient Prescriptions  Medication Sig Dispense Refill  . acetaminophen (TYLENOL) 500 MG tablet Take 500 mg by mouth every 12 (twelve) hours as needed. Knee Pain    . aspirin EC 81 MG tablet Take 81 mg by mouth at bedtime.     [name] atorvastatin (LIPITOR) 40 MG tablet TAKE 1 TABLET BY MOUTH EVERY DAY 90 tablet 2  . Cyanocobalamin (B-12) 5000 MCG CAPS Take 1 capsule by mouth daily.    [name] diltiazem (CARDIZEM CD) 360 MG 24 hr capsule Take 1 capsule (360 mg total) by mouth daily. 30 capsule 11  . dofetilide (TIKOSYN) 250 MCG capsule TAKE 1 CAPSULE (250 MCG TOTAL) BY MOUTH 2 (TWO) TIMES DAILY. 180 capsule 2  . FLUoxetine (PROZAC) 20 MG capsule TAKE ONE CAPSULE BY MOUTH EVERY MORNING 90 capsule 1  . furosemide (LASIX) 40 MG tablet 2 tablets (80mg ) by mouth in the AM and 1 tablet (40mg ) by mouth in the PM 360 tablet 0  . KLOR-CON M20 20 MEQ tablet TAKE 1 TABLET BY MOUTH TWICE A DAY 180 tablet 3  . losartan (COZAAR) 50 MG tablet Take 1 tablet (50 mg total) by mouth daily. 30 tablet 11  . Lysine HCl 1000 MG TABS Take 1 tablet by mouth at bedtime.     . metoprolol succinate (TOPROL-XL) 100 MG 24 hr tablet Take 1 tablet (100 mg total) by mouth 2 (two) times daily. 180 tablet 3  . Multiple Vitamin (MULTIVITAMIN WITH MINERALS) TABS tablet Take 1 tablet by mouth daily. Centrum    .  nitroGLYCERIN (NITROSTAT) 0.4 MG SL tablet Place 1 tablet (0.4 mg total) under the tongue every 5 (five) minutes x 3 doses as needed for chest pain. 25 tablet 6  . Polyethyl Glycol-Propyl Glycol (SYSTANE OP) Place 1 drop into both eyes daily as needed (dry eyes).    [name] PRADAXA 150 MG CAPS capsule TAKE 1 CAPSULE BY MOUTH EVERY 12 HOURS 180 capsule 3  . PRESCRIPTION MEDICATION Inhale into the lungs at bedtime. CPAP     No current facility-administered medications for this visit.          Past Medical History:  Diagnosis Date  . Anticoagulant long-term use    pradaxa  . Anxiety   . Arthritis    low back & both knees, Spondylolisthesis    . CAD (coronary artery disease) 8338,2505   post PTCA with bare-metal stenting to mid RCA in December 2004     . CHF (congestive heart failure) ([place])   . Chronic atrial fibrillation ([place]) 06/2007   Tachybradycardia pacemaker  . Chronic kidney disease    10% function - ?R, other kidney is compensating    . Depression   . Diplopia 06/19/2008   Qualifier: Diagnosis of  By: [name] MD, [name]   . Dysrhythmia    ATRIAL FIBRILATION  . Edema of lower extremity   . History  of acute inferior wall MI   . History of CVA (cerebrovascular accident)   . Hyperlipidemia   . Hypertension   . Myocardial infarction 3614,4315  . OSA on CPAP    last test- 2010  . Pacemaker   . Pneumonia 2014   tx. ----  [facility]  . Shortness of breath   . Stroke ([place]) [id number]  . TIA (transient ischemic attack)   . Transfusion history   . Unspecified hemorrhoids without mention of complication 4/00/8676   Colonoscopy--Dr. Carlean Purl     ROS:   All systems reviewed and negative except as noted in the HPI.        Past Surgical History:  Procedure Laterality Date  . APPENDECTOMY    . back surgeries     x2-3 by Dr Trenton Gammon  . BACK SURGERY    . CHOLECYSTECTOMY  1994  . CORONARY ANGIOPLASTY WITH STENT PLACEMENT  1998   . CORONARY STENT PLACEMENT     C stent  . DOPPLER ECHOCARDIOGRAPHY  2009  . ELECTROPHYSIOLOGIC STUDY N/A 03/31/2016   Procedure: Cardioversion;  Surgeon: Evans Lance, MD;  Location: [place] CV LAB;  Service: Cardiovascular;  Laterality: N/A;  . LEFT AND RIGHT HEART CATHETERIZATION WITH CORONARY ANGIOGRAM N/A 10/06/2013   Procedure: LEFT AND RIGHT HEART CATHETERIZATION WITH CORONARY ANGIOGRAM;  Surgeon: Troy Sine, MD;  [facility];  Service: Cardiovascular;  Laterality: N/A;  . permanent pacemaker  06/2007  . TOTAL ABDOMINAL HYSTERECTOMY  1984   2 surgeries endometriosis bso           Family History  Problem Relation Age of Onset  . Suicidality Father     suicide death pt was 3 yrs  . Arrhythmia Mother   . Hypertension Mother   . Diabetes Mother   . Heart attack Brother 47  . Heart disease Paternal Aunt   . Prostate cancer Maternal Grandfather   . Diabetes Paternal Grandfather     fathers side of the family     Social History        Social History  . Marital status: Married    Spouse name: N/A  . Number of children: 0  . Years of education: HS        Occupational History  . retired     previously worked [place]  . Smoking status: Former Smoker    Packs/day: 1.00    Years: 5.00    Types: Cigarettes    Start date: 05/11/1978    Quit date: 07/03/1982  . Smokeless tobacco: Never Used  . Alcohol use Yes     Comment: once q 6 months-socailly-wine  . Drug use: No  . Sexual activity: Not Currently       Other Topics Concern  . 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   Married    Originally from [place] of two, high school education   Former smoker [phone] 1ppd   Hunting dogs 7    Retired from [employer] 2      BP 112/78   Pulse 97    Ht 5' 6.6" (1.692 m)   Wt 258 lb 3.2 oz (117.1 kg)   BMI 40.93 kg/m   Physical Exam:  Obese appearing 71 yo woman, NAD HEENT: Unremarkable  Neck:  7 cm JVD, no thyromegally Lungs:  Clear with no wheezes, rales, or rhonchi. HEART:  Regular rate rhythm, no murmurs, no rubs, no clicks Abd:  soft, positive bowel sounds, no organomegally, no rebound, no guarding Ext:  2 plus pulses, 1+ peripheral edema, no cyanosis, no clubbing Skin:  No rashes no nodules Neuro:  CN II through XII intact, motor grossly intact  DEVICE  Normal device function.  See PaceArt for details.   Assess/Plan:  1. PAF - she is in atrial fib with a controlled VR. She does not feel well. We discussed the treatment options in detail. I have recommended repeat DCCV and referral for consideration for atrial fib ablation. If she does not hold NSR, I would anticipate a brief course of amiodarone/ablation/amiodarone and then stopping amiodarone. 2. HTN - her blood pressure is fairly well controlled.  3. PPM - her [employer] DDD PM is working normally. Will recheck in several months. 4. Obesity - she is encouraged to lose weight 5. Chronic diastolic heart failure - she has a little fluid excess. She is encouraged to maintain a low sodium diet.  Mikle Bosworth.D.

## 2016-08-07 NOTE — Telephone Encounter (Signed)
Rx sent in as clinic administered med -- changed to home medication and resent to pharmacy   Patsey Berthold, NP      08/07/16 10:23 AM  Note    Pharmacy called regarding interaction between Pradaxa and amiodarone. Discussed with Dr Lovena Le. Pradaxa changed to Eliquis.  New Rx sent in. Pt aware and agrees with plan.   Chanetta Marshall, NP 08/07/2016 10:24 AM

## 2016-08-07 NOTE — Telephone Encounter (Signed)
New Message   Pt c/o medication issue:  1. Name of Medication:   amiodarone (PACERONE) 200 MG tablet   2. How are you currently taking this medication (dosage and times per day)? Has not started yet  3. Are you having a reaction (difficulty breathing--STAT)? No  4. What is your medication issue? Per patient was supposed to start medication Friday but pharmacy would not fill this prescription due to it counter acting with another medication she is on. Pharmacy did not tell her which medication it would counter act with, just told her to reach out to Dr. Gabriel Carina.  Requesting call back

## 2016-08-08 ENCOUNTER — Other Ambulatory Visit: Payer: Self-pay | Admitting: Cardiology

## 2016-08-08 ENCOUNTER — Ambulatory Visit (INDEPENDENT_AMBULATORY_CARE_PROVIDER_SITE_OTHER): Payer: PPO | Admitting: Cardiology

## 2016-08-08 ENCOUNTER — Encounter: Payer: Self-pay | Admitting: Cardiology

## 2016-08-08 VITALS — BP 122/80 | HR 96 | Ht 66.5 in | Wt 256.4 lb

## 2016-08-08 DIAGNOSIS — Z01812 Encounter for preprocedural laboratory examination: Secondary | ICD-10-CM

## 2016-08-08 DIAGNOSIS — I480 Paroxysmal atrial fibrillation: Secondary | ICD-10-CM | POA: Diagnosis not present

## 2016-08-08 NOTE — Progress Notes (Signed)
Electrophysiology Office Note   Date:  08/08/2016   ID:  [name], DOB [birth year], MRN [id number]  PCP:  Lottie Dawson, [name]  Primary Electrophysiologist: Gaye Alken, [name]    Chief Complaint  Patient presents with  . Pacemaker Check    PAF     History of Present Illness: [name] is a 71 y.o. female who presents today for electrophysiology evaluation.   She has a history of coronary artery disease status post stent to the RCA in 2004, CHF, atrial fibrillation with tachybradycardia syndrome status post pacemaker, CK D, hypertension, hyperlipidemia, inferior wall MI, sleep apnea on CPAP, stroke. She was doing well with her atrial fibrillation maintaining sinus rhythm on CPAP and dofetilide until this past summer where she developed an upper airway infection and required cardioversion. She subsequently developed atrial fibrillation 2 months ago. She is quite short of breath with her atrial fibrillation. She had a cardioversion 4 days ago,, but return to atrial fibrillation after 3 minutes. She is planned to start amiodarone.   Today, she denies symptoms of chest pain, shortness of breath, orthopnea, PND, lower extremity edema, claudication, dizziness, presyncope, syncope, bleeding, or neurologic sequela. The patient is tolerating medications without difficulties and is otherwise without complaint today.    Past Medical History:  Diagnosis Date  . Anticoagulant long-term use    pradaxa  . Anxiety   . Arthritis    low back & both knees, Spondylolisthesis    . CAD (coronary artery disease) 1607,3710   post PTCA with bare-metal stenting to mid RCA in December 2004     . CHF (congestive heart failure) ([place])   . Chronic atrial fibrillation ([place]) 06/2007   Tachybradycardia pacemaker  . Chronic kidney disease    10% function - ?R, other kidney is compensating    . Depression   . Diplopia 06/19/2008   Qualifier: Diagnosis of  By: [name], [name]   .  Dysrhythmia    ATRIAL FIBRILATION  . Edema of lower extremity   . History of acute inferior wall MI   . History of CVA (cerebrovascular accident)   . Hyperlipidemia   . Hypertension   . Myocardial infarction [id number]  . OSA on CPAP    last test- 2010  . Pacemaker   . Pneumonia 2014   tx. ----  [facility]  . Shortness of breath   . Stroke ([place]) [id number]  . TIA (transient ischemic attack)   . Transfusion history   . Unspecified hemorrhoids without mention of complication 12/27/348   Colonoscopy--Dr. Carlean Purl    Past Surgical History:  Procedure Laterality Date  . APPENDECTOMY    . back surgeries     x2-3 by Dr Trenton Gammon  . BACK SURGERY    . CHOLECYSTECTOMY  1994  . CORONARY ANGIOPLASTY WITH STENT PLACEMENT  1998  . CORONARY STENT PLACEMENT     C stent  . DOPPLER ECHOCARDIOGRAPHY  2009  . ELECTROPHYSIOLOGIC STUDY N/A 03/31/2016   Procedure: Cardioversion;  Surgeon: Evans Lance, [name];  Location: [place] CV LAB;  Service: Cardiovascular;  Laterality: N/A;  . ELECTROPHYSIOLOGIC STUDY N/A 08/04/2016   Procedure: Cardioversion;  Surgeon: Evans Lance, [name];  Location: [place] CV LAB;  Service: Cardiovascular;  Laterality: N/A;  . LEFT AND RIGHT HEART CATHETERIZATION WITH CORONARY ANGIOGRAM N/A 10/06/2013   Procedure: LEFT AND RIGHT HEART CATHETERIZATION WITH CORONARY ANGIOGRAM;  Surgeon: Troy Sine, [name];  Location: [facility] CATH LAB;  Service: Cardiovascular;  Laterality: N/A;  . permanent pacemaker  06/2007  . TOTAL ABDOMINAL HYSTERECTOMY  1984   2 surgeries endometriosis bso     Current Outpatient Prescriptions  Medication Sig Dispense Refill  . acetaminophen (TYLENOL) 500 MG tablet Take 500 mg by mouth every 12 (twelve) hours as needed. Knee Pain    . amiodarone (PACERONE) 200 MG tablet Take 1 tablet (200 mg total) by mouth 2 (two) times daily. Start 08/06/16 - stop Tikosyn today 60 tablet 4  . apixaban (ELIQUIS) 5 MG TABS tablet Take 1 tablet (5 mg total) by mouth 2 (two)  times daily. 60 tablet 6  . atorvastatin (LIPITOR) 40 MG tablet TAKE 1 TABLET BY MOUTH EVERY DAY 90 tablet 2  . Cyanocobalamin (B-12) 5000 MCG CAPS Take 5,000 mcg by mouth daily at 12 noon.     . diltiazem (CARDIZEM CD) 360 MG 24 hr capsule Take 1 capsule (360 mg total) by mouth daily. 30 capsule 11  . FLUoxetine (PROZAC) 20 MG capsule TAKE ONE CAPSULE BY MOUTH EVERY MORNING 90 capsule 1  . fluticasone (FLONASE) 50 MCG/ACT nasal spray Place 1 spray into both nostrils daily as needed for allergies or rhinitis.    . furosemide (LASIX) 40 MG tablet 2 tablets (80mg ) by mouth in the AM and 1 tablet (40mg ) by mouth in the PM 360 tablet 0  . KLOR-CON M20 20 MEQ tablet TAKE 1 TABLET BY MOUTH TWICE A DAY 180 tablet 3  . losartan (COZAAR) 50 MG tablet Take 1 tablet (50 mg total) by mouth daily. 30 tablet 11  . Lysine HCl 1000 MG TABS Take 1,000 mg by mouth at bedtime.     . metoprolol succinate (TOPROL-XL) 100 MG 24 hr tablet Take 1 tablet (100 mg total) by mouth 2 (two) times daily. 180 tablet 3  . Multiple Vitamin (MULTIVITAMIN WITH MINERALS) TABS tablet Take 1 tablet by mouth daily. Centrum    . nitroGLYCERIN (NITROSTAT) 0.4 MG SL tablet Place 1 tablet (0.4 mg total) under the tongue every 5 (five) minutes x 3 doses as needed for chest pain. 25 tablet 6  . Polyethyl Glycol-Propyl Glycol (SYSTANE OP) Place 1 drop into both eyes daily as needed (dry eyes).    [name] PRESCRIPTION MEDICATION Inhale into the lungs at bedtime. CPAP     No current facility-administered medications for this visit.     Allergies:   Patient has no known allergies.   Social History:  The patient  reports that she quit smoking about 34 years ago. Her smoking use included Cigarettes. She started smoking about 38 years ago. She has a 5.00 pack-year smoking history. She has never used smokeless tobacco. She reports that she drinks alcohol. She reports that she does not use drugs.   Family History:  The patient's family history includes  Arrhythmia in her mother; Diabetes in her mother and paternal grandfather; Heart attack (age of onset: 71) in her brother; Heart disease in her paternal aunt; Hypertension in her mother; Prostate cancer in her maternal grandfather; Suicidality in her father.    ROS:  Please see the history of present illness.   Otherwise, review of systems is positive for Leg swelling, shortness of breath lying down, palpitations, dyspnea on exertion, back pain.   All other systems are reviewed and negative.    PHYSICAL EXAM: VS:  BP 122/80   Pulse 96   Ht 5' 6.5" (1.689 m)   Wt 256 lb 6.4 oz (116.3 kg)   BMI 40.76 kg/m  ,  BMI Body mass index is 40.76 kg/m. GEN: Well nourished, well developed, in no acute distress  HEENT: normal  Neck: no JVD, carotid bruits, or masses Cardiac: iRRR; no murmurs, rubs, or gallops,no edema  Respiratory:  clear to auscultation bilaterally, normal work of breathing GI: soft, nontender, nondistended, + BS MS: no deformity or atrophy  Skin: warm and dry,  device pocket is well healed Neuro:  Strength and sensation are intact Psych: euthymic mood, full affect  EKG:  EKG is ordered today. Personal review of the ekg ordered shows atrial fibrillation, rate 96   Device interrogation is reviewed today in detail.  See PaceArt for details.   Recent Labs: 03/26/2016: B Natriuretic Peptide 553.9 04/01/2016: ALT 53; Hemoglobin 13.7 07/24/2016: TSH 3.56 07/28/2016: Magnesium 2.3 08/02/2016: BUN 26; Creatinine, Ser 0.94; Platelets 327; Potassium 4.9; Sodium 142    Lipid Panel     Component Value Date/Time   CHOL 100 03/31/2016 0248   TRIG 143 03/31/2016 0248   HDL 22 (L) 03/31/2016 0248   CHOLHDL 4.5 03/31/2016 0248   VLDL 29 03/31/2016 0248   LDLCALC 49 03/31/2016 0248     Wt Readings from Last 3 Encounters:  08/08/16 256 lb 6.4 oz (116.3 kg)  08/04/16 260 lb (117.9 kg)  08/02/16 258 lb 3.2 oz (117.1 kg)      Other studies Reviewed: Additional studies/ records  that were reviewed today include: TTE 03/28/16  Review of the above records today demonstrates:  - Left ventricle: The cavity size was normal. Wall thickness was   increased in a pattern of mild LVH. Systolic function was normal.   The estimated ejection fraction was in the range of 55% to 60%.   Indeterminant diastolic function (atrial fibrillation). Wall   motion was normal; there were no regional wall motion   abnormalities. - Aortic valve: There was no stenosis. - Mitral valve: Mildly calcified annulus. There was no significant   regurgitation. - Left atrium: The atrium was mildly dilated. - Right ventricle: The cavity size was normal. Pacer wire or   catheter noted in right ventricle. Systolic function was normal. - Tricuspid valve: Peak RV-RA gradient (S): 22 mm Hg. - Pulmonary arteries: PA peak pressure: 30 mm Hg (S). - Systemic veins: IVC measured 2.3 cm with normal respirophasic   variation, suggesting RA pressure 8 mmHg.   ASSESSMENT AND PLAN:  1.  Paroxysmal atrial fibrillation: Has controlled ventricular rates, but does have symptoms of shortness of breath and fatigue. Had unsuccessful cardioversion 08/04/16. Referred for the possibility of atrial fibrillation ablation. Risks and benefits of the procedure were discussed. Risks include bleeding, tamponade, heart block, stroke, and damage to surrounding organs, among others. She understands these risks and has agreed to ablation.  2. Hypertension: Well-controlled on current medications  3. Obesity: Weight loss encouraged  4. Chronic diastolic heart failure: Currently well compensated. No changes today.  5. Obstructive sleep apnea: on CPAP    Current medicines are reviewed at length with the patient today.   The patient does not have concerns regarding her medicines.  The following changes were made today:  none  Labs/ tests ordered today include:  No orders of the defined types were placed in this  encounter.    Disposition:   FU with Dayrin Stallone 2 months  Signed, Mattalynn Crandle Meredith Leeds, [name]  08/08/2016 2:15 PM     [address] [place] [address] [phone] (office) [phone] (fax)

## 2016-08-08 NOTE — Patient Instructions (Signed)
Medication Instructions:    Your physician recommends that you continue on your current medications as directed. Please refer to the Current Medication list given to you today.  --- If you need a refill on your cardiac medications before your next appointment, please call your pharmacy. ---  Labwork:  None ordered  Testing/Procedures: Your physician has recommended that you have an ablation. Catheter ablation is a medical procedure used to treat some cardiac arrhythmias (irregular heartbeats). During catheter ablation, a long, thin, flexible tube is put into a blood vessel in your groin (upper thigh), or neck. This tube is called an ablation catheter. It is then guided to your heart through the blood vessel. Radio frequency waves destroy small areas of heart tissue where abnormal heartbeats may cause an arrhythmia to start.   Candas Deemer, RN will call you to arrange this procedure.  Follow-Up:  To be determined once procedure is scheduled.  Thank you for choosing [facility]!!   Trinidad Curet, RN [phone]   Any Other Special Instructions Will Be Listed Below (If Applicable).  Cardiac Ablation Cardiac ablation is a procedure to disable a small amount of heart tissue in very specific places. The heart has many electrical connections. Sometimes these connections are abnormal and can cause the heart to beat very fast or irregularly. By disabling some of the problem areas, heart rhythm can be improved or made normal. Ablation is done for people who:   Have Wolff-Parkinson-White syndrome.   Have other fast heart rhythms (tachycardia).   Have taken medicines for an abnormal heart rhythm (arrhythmia) that resulted in:   No success.   Side effects.   May have a high-risk heartbeat that could result in death.  LET [facility] CARE PROVIDER KNOW ABOUT:   Any allergies you have or any previous reactions you have had to X-ray dye, food (such as seafood), medicine, or tape.    All medicines you are taking, including vitamins, herbs, eye drops, creams, and over-the-counter medicines.   Previous problems you or members of your family have had with the use of anesthetics.   Any blood disorders you have.   Previous surgeries or procedures (such as a kidney transplant) you have had.   Medical conditions you have (such as kidney failure).  RISKS AND COMPLICATIONS Generally, cardiac ablation is a safe procedure. However, problems can occur and include:   Increased risk of cancer. Depending on how long it takes to do the ablation, the dose of radiation can be high.  Bruising and bleeding where a thin, flexible tube (catheter) was inserted during the procedure.   Bleeding into the chest, especially into the sac that surrounds the heart (serious).  Need for a permanent pacemaker if the normal electrical system is damaged.   The procedure may not be fully effective, and this may not be recognized for months. Repeat ablation procedures are sometimes required. BEFORE THE PROCEDURE   Follow any instructions from your health care provider regarding eating and drinking before the procedure.   Take your medicines as directed at regular times with water, unless instructed otherwise by your health care provider. If you are taking diabetes medicine, including insulin, ask how you are to take it and if there are any special instructions you should follow. It is common to adjust insulin dosing the day of the ablation.  PROCEDURE  An ablation is usually performed in a catheterization laboratory with the guidance of fluoroscopy. Fluoroscopy is a type of X-ray that helps your health care provider  see images of your heart during the procedure.   An ablation is a minimally invasive procedure. This means a small cut (incision) is made in either your neck or groin. Your health care provider will decide where to make the incision based on your medical history and physical  exam.  An IV tube will be started before the procedure begins. You will be given an anesthetic or medicine to help you relax (sedative).  The skin on your neck or groin will be numbed. A needle will be inserted into a large vein in your neck or groin and catheters will be threaded to your heart.  A special dye that shows up on fluoroscopy pictures may be injected through the catheter. The dye helps your health care provider see the area of the heart that needs treatment.  The catheter has electrodes on the tip. When the area of heart tissue that is causing the arrhythmia is found, the catheter tip will send an electrical current to the area and "scar" the tissue. Three types of energy can be used to ablate the heart tissue:   Heat (radiofrequency energy).   Laser energy.   Extreme cold (cryoablation).   When the area of the heart has been ablated, the catheter will be taken out. Pressure will be held on the insertion site. This will help the insertion site clot and keep it from bleeding. A bandage will be placed on the insertion site.  AFTER THE PROCEDURE   After the procedure, you will be taken to a recovery area where your vital signs (blood pressure, heart rate, and breathing) will be monitored. The insertion site will also be monitored for bleeding.   You will need to lie still for 4-6 hours. This is to ensure you do not bleed from the catheter insertion site.  This information is not intended to replace advice given to you by your health care provider. Make sure you discuss any questions you have with your health care provider. Document Released: 11/05/2008 Document Revised: 07/10/2014 Document Reviewed: 11/11/2012 Elsevier Interactive Patient Education  2017 [employer].

## 2016-08-14 LAB — CUP PACEART INCLINIC DEVICE CHECK
Battery Voltage: 2.78 V
Date Time Interrogation Session: 20180212161633
Implantable Lead Implant Date: 20081211
Implantable Lead Implant Date: 20081211
Implantable Lead Location: 753859
Implantable Lead Location: 753860
Implantable Pulse Generator Implant Date: 20081211
Lead Channel Impedance Value: 481 Ohm
Lead Channel Impedance Value: 573 Ohm
Lead Channel Pacing Threshold Amplitude: 1.25 V
Lead Channel Pacing Threshold Amplitude: 1.75 V
Lead Channel Pacing Threshold Pulse Width: 0.4 ms
Lead Channel Pacing Threshold Pulse Width: 0.5 ms
Lead Channel Sensing Intrinsic Amplitude: 0.6 mV
Lead Channel Sensing Intrinsic Amplitude: 12 mV
Lead Channel Setting Pacing Amplitude: 2 V
Lead Channel Setting Pacing Amplitude: 2.5 V
Lead Channel Setting Pacing Pulse Width: 0.5 ms
Lead Channel Setting Sensing Sensitivity: 2 mV
Pulse Gen Model: 5826
Pulse Gen Serial Number: 1999089

## 2016-08-18 DIAGNOSIS — D225 Melanocytic nevi of trunk: Secondary | ICD-10-CM | POA: Diagnosis not present

## 2016-08-18 DIAGNOSIS — C44719 Basal cell carcinoma of skin of left lower limb, including hip: Secondary | ICD-10-CM | POA: Diagnosis not present

## 2016-08-18 DIAGNOSIS — C44319 Basal cell carcinoma of skin of other parts of face: Secondary | ICD-10-CM | POA: Diagnosis not present

## 2016-08-18 DIAGNOSIS — D1801 Hemangioma of skin and subcutaneous tissue: Secondary | ICD-10-CM | POA: Diagnosis not present

## 2016-08-18 DIAGNOSIS — L812 Freckles: Secondary | ICD-10-CM | POA: Diagnosis not present

## 2016-08-18 DIAGNOSIS — D2262 Melanocytic nevi of left upper limb, including shoulder: Secondary | ICD-10-CM | POA: Diagnosis not present

## 2016-08-18 DIAGNOSIS — L570 Actinic keratosis: Secondary | ICD-10-CM | POA: Diagnosis not present

## 2016-08-18 DIAGNOSIS — L821 Other seborrheic keratosis: Secondary | ICD-10-CM | POA: Diagnosis not present

## 2016-08-18 DIAGNOSIS — D485 Neoplasm of uncertain behavior of skin: Secondary | ICD-10-CM | POA: Diagnosis not present

## 2016-08-18 DIAGNOSIS — D2261 Melanocytic nevi of right upper limb, including shoulder: Secondary | ICD-10-CM | POA: Diagnosis not present

## 2016-08-24 ENCOUNTER — Telehealth: Payer: Self-pay | Admitting: *Deleted

## 2016-08-24 ENCOUNTER — Encounter: Payer: Self-pay | Admitting: *Deleted

## 2016-08-24 DIAGNOSIS — Z01812 Encounter for preprocedural laboratory examination: Secondary | ICD-10-CM

## 2016-08-24 DIAGNOSIS — I480 Paroxysmal atrial fibrillation: Secondary | ICD-10-CM

## 2016-08-24 NOTE — Telephone Encounter (Signed)
Scheduled AFib ablation 09/29/16. H&P and pre labs on 3/19. Pt aware office will call her to schedule cardiac CT w/i 7 days prior to procedure. Will scheduled post procedure appts at [facility] on 3/19. Patient verbalized understanding and agreeable to plan.

## 2016-08-29 ENCOUNTER — Encounter: Payer: Self-pay | Admitting: Cardiology

## 2016-09-01 ENCOUNTER — Ambulatory Visit: Payer: PPO | Admitting: Cardiology

## 2016-09-03 ENCOUNTER — Encounter (HOSPITAL_COMMUNITY): Payer: Self-pay | Admitting: Family Medicine

## 2016-09-03 ENCOUNTER — Ambulatory Visit (INDEPENDENT_AMBULATORY_CARE_PROVIDER_SITE_OTHER): Payer: PPO

## 2016-09-03 ENCOUNTER — Ambulatory Visit (HOSPITAL_COMMUNITY)
Admission: EM | Admit: 2016-09-03 | Discharge: 2016-09-03 | Disposition: A | Discharge status: Home / Self Care | Payer: PPO | Attending: Family Medicine | Admitting: Family Medicine

## 2016-09-03 DIAGNOSIS — R05 Cough: Secondary | ICD-10-CM | POA: Diagnosis not present

## 2016-09-03 DIAGNOSIS — R0981 Nasal congestion: Secondary | ICD-10-CM | POA: Diagnosis not present

## 2016-09-03 DIAGNOSIS — J40 Bronchitis, not specified as acute or chronic: Secondary | ICD-10-CM | POA: Diagnosis not present

## 2016-09-03 DIAGNOSIS — R0602 Shortness of breath: Secondary | ICD-10-CM | POA: Diagnosis not present

## 2016-09-03 MED ORDER — ALBUTEROL SULFATE (2.5 MG/3ML) 0.083% IN NEBU
INHALATION_SOLUTION | RESPIRATORY_TRACT | Status: AC
  Filled 2016-09-03: qty 3

## 2016-09-03 MED ORDER — AMOXICILLIN-POT CLAVULANATE 875-125 MG PO TABS: 1.0000 | ORAL_TABLET | Freq: Two times a day (BID) | ORAL | 0 refills | Status: DC

## 2016-09-03 MED ORDER — ALBUTEROL SULFATE HFA 108 (90 BASE) MCG/ACT IN AERS: 2.0000 | INHALATION_SPRAY | RESPIRATORY_TRACT | 1 refills | Status: DC | PRN

## 2016-09-03 MED ORDER — ALBUTEROL SULFATE (2.5 MG/3ML) 0.083% IN NEBU
2.5000 mg | INHALATION_SOLUTION | Freq: Once | RESPIRATORY_TRACT | Status: AC
  Administered 2016-09-03: 2.5 mg via RESPIRATORY_TRACT

## 2016-09-03 NOTE — ED Triage Notes (Signed)
The patient presented to the [facility] with a complaint of a cough with congestion and nasal drainage x 4 days.

## 2016-09-03 NOTE — ED Provider Notes (Signed)
[name]    CSN: [id number] Arrival date & time: 09/03/16  1203     History   Chief Complaint Chief Complaint  Patient presents with  . Cough    HPI [name] is a 71 y.o. female.   Is a 71 year old woman with a history of congestive failure and recurrent pneumonia presents with a week of progressive cough and shortness of breath. He's had no fever. She also denies any swelling of her ankles.      Past Medical History:  Diagnosis Date  . Anticoagulant long-term use    pradaxa  . Anxiety   . Arthritis    low back & both knees, Spondylolisthesis    . CAD (coronary artery disease) 9163,8466   post PTCA with bare-metal stenting to mid RCA in December 2004     . CHF (congestive heart failure) ([place])   . Chronic atrial fibrillation ([place]) 06/2007   Tachybradycardia pacemaker  . Chronic kidney disease    10% function - ?R, other kidney is compensating    . Depression   . Diplopia 06/19/2008   Qualifier: Diagnosis of  By: [name] MD, [name]   . Dysrhythmia    ATRIAL FIBRILATION  . Edema of lower extremity   . History of acute inferior wall MI   . History of CVA (cerebrovascular accident)   . Hyperlipidemia   . Hypertension   . Myocardial infarction 5993,5701  . OSA on CPAP    last test- 2010  . Pacemaker   . Pneumonia 2014   tx. ----  [facility]  . Shortness of breath   . Stroke ([place]) [id number]  . TIA (transient ischemic attack)   . Transfusion history   . Unspecified hemorrhoids without mention of complication 7/79/3903   Colonoscopy--Dr. Carlean Purl     Patient Active Problem List   Diagnosis Date Noted  . Hyperlipidemia   . CAD (coronary artery disease)   . Chronic atrial fibrillation ([place])   . HLD (hyperlipidemia)   . Controlled type 2 diabetes mellitus without complication ([place])   . Atrial fibrillation with RVR ([place]) 03/26/2016  . Acute respiratory failure with hypoxia ([place]) 03/26/2016  . DDD (degenerative disc disease), lumbar  04/05/2015  . Degenerative disc disease, lumbar 04/05/2015  . Severe obesity (BMI >= 40) ([place]) 02/16/2015  . Fatty liver disease, nonalcoholic 00/92/3300  . Cough, persistent 04/03/2014  . Acute exacerbation of CHF (congestive heart failure) ([place]) 03/21/2014  . CHF (congestive heart failure) ([place]) 03/21/2014  . Acute on chronic diastolic congestive heart failure ([place]) 10/07/2013  . Acute diastolic heart failure ([place]) 10/04/2013  . Pre-diabetes 09/24/2013  . Peripheral edema 08/05/2013  . Neoplasm of uncertain behavior of skin of back 11/09/2012  . Edema 11/04/2012  . Bleeding mole 11/04/2012  . Pulmonary hypertension 06/11/2012  . Pneumonia 06/05/2012  . Hypoxia 06/05/2012  . Anemia 06/05/2012  . Hemorrhoids 05/11/2012  . Dyspepsia 05/11/2012  . Visit for preventive health examination 04/20/2011  . Positional vertigo 10/07/2010  . Anticoagulant long-term use   . HERPES LABIALIS 12/30/2009  . INTERTRIGO 12/30/2009  . OBESITY 06/02/2009  . Obstructive sleep apnea 10/12/2008  . HYPERLIPIDEMIA 09/15/2008  . SNORING 09/15/2008  . Essential hypertension 06/19/2008  . CAD- previous LAD and RCA stents- patent at cath 10/06/13 06/19/2008  . Paroxysmal atrial fibrillation ([place]) 06/19/2008  . BRADYCARDIA-TACHYCARDIA SYNDROME 06/19/2008  . CEREBRAL ANEURYSM 06/19/2008  . ALLERGIC RHINITIS 06/19/2008  . HYPERGLYCEMIA 06/19/2008  . DEPRESSION, HX OF 06/19/2008  . CEREBROVASCULAR  ACCIDENT, HX OF 06/19/2008  . PACEMAKER, PERMANENT 06/19/2008    Past Surgical History:  Procedure Laterality Date  . APPENDECTOMY    . back surgeries     x2-3 by Dr Trenton Gammon  . BACK SURGERY    . CHOLECYSTECTOMY  1994  . CORONARY ANGIOPLASTY WITH STENT PLACEMENT  1998  . CORONARY STENT PLACEMENT     C stent  . DOPPLER ECHOCARDIOGRAPHY  2009  . ELECTROPHYSIOLOGIC STUDY N/A 03/31/2016   Procedure: Cardioversion;  Surgeon: Evans Lance, MD;  Location: [facility];  Service: Cardiovascular;  Laterality:  N/A;  . ELECTROPHYSIOLOGIC STUDY N/A 08/04/2016   Procedure: Cardioversion;  Surgeon: Evans Lance, MD;  Location: [facility];  Service: Cardiovascular;  Laterality: N/A;  . LEFT AND RIGHT HEART CATHETERIZATION WITH CORONARY ANGIOGRAM N/A 10/06/2013   Procedure: LEFT AND RIGHT HEART CATHETERIZATION WITH CORONARY ANGIOGRAM;  Surgeon: Troy Sine, MD;  Location: [facility] CATH LAB;  Service: Cardiovascular;  Laterality: N/A;  . permanent pacemaker  06/2007  . TOTAL ABDOMINAL HYSTERECTOMY  1984   2 surgeries endometriosis bso    OB History    No data available       Home Medications    Prior to Admission medications   Medication Sig Start Date End Date Taking? Authorizing Provider  acetaminophen (TYLENOL) 500 MG tablet Take 500 mg by mouth every 12 (twelve) hours as needed. Knee Pain    Historical Provider, MD  albuterol (PROVENTIL HFA;VENTOLIN HFA) 108 (90 Base) MCG/ACT inhaler Inhale 2 puffs into the lungs every 4 (four) hours as needed for wheezing or shortness of breath (cough, shortness of breath or wheezing.). 09/03/16   Robyn Haber, MD  amiodarone (PACERONE) 200 MG tablet Take 1 tablet (200 mg total) by mouth 2 (two) times daily. Start 08/06/16 - stop Tikosyn today 08/04/16   Patsey Berthold, NP  amoxicillin-clavulanate (AUGMENTIN) 875-125 MG tablet Take 1 tablet by mouth every 12 (twelve) hours. 09/03/16   Robyn Haber, MD  apixaban (ELIQUIS) 5 MG TABS tablet Take 1 tablet (5 mg total) by mouth 2 (two) times daily. 08/07/16   Evans Lance, MD  atorvastatin (LIPITOR) 40 MG tablet TAKE 1 TABLET BY MOUTH EVERY DAY 03/14/16   Thayer Headings, MD  Cyanocobalamin (B-12) 5000 MCG CAPS Take 5,000 mcg by mouth daily at 12 noon.     Historical Provider, MD  diltiazem (CARDIZEM CD) 360 MG 24 hr capsule Take 1 capsule (360 mg total) by mouth daily. 07/06/16   Thayer Headings, MD  FLUoxetine (PROZAC) 20 MG capsule TAKE ONE CAPSULE BY MOUTH EVERY MORNING 06/30/16   [name], MD  fluticasone  (FLONASE) 50 MCG/ACT nasal spray Place 1 spray into both nostrils daily as needed for allergies or rhinitis.    Historical Provider, MD  furosemide (LASIX) 40 MG tablet 2 tablets (80mg ) by mouth in the AM and 1 tablet (40mg ) by mouth in the PM 05/15/16   Thayer Headings, MD  KLOR-CON M20 20 MEQ tablet TAKE 1 TABLET BY MOUTH TWICE A DAY 05/04/16   Thayer Headings, MD  losartan (COZAAR) 50 MG tablet Take 1 tablet (50 mg total) by mouth daily. 07/06/16 10/04/16  Thayer Headings, MD  Lysine HCl 1000 MG TABS Take 1,000 mg by mouth at bedtime.     Historical Provider, MD  metoprolol succinate (TOPROL-XL) 100 MG 24 hr tablet Take 1 tablet (100 mg total) by mouth 2 (two) times daily. 07/11/16  Thayer Headings, MD  Multiple Vitamin (MULTIVITAMIN WITH MINERALS) TABS tablet Take 1 tablet by mouth daily. Centrum    Historical Provider, MD  nitroGLYCERIN (NITROSTAT) 0.4 MG SL tablet Place 1 tablet (0.4 mg total) under the tongue every 5 (five) minutes x 3 doses as needed for chest pain. 07/28/16   Thayer Headings, MD  Polyethyl Glycol-Propyl Glycol (SYSTANE OP) Place 1 drop into both eyes daily as needed (dry eyes).    Historical Provider, MD  PRESCRIPTION MEDICATION Inhale into the lungs at bedtime. CPAP    Historical Provider, MD    Family History Family History  Problem Relation Age of Onset  . Suicidality Father     suicide death pt was 3 yrs  . Arrhythmia Mother   . Hypertension Mother   . Diabetes Mother   . Heart attack Brother 46  . Heart disease Paternal Aunt   . Prostate cancer Maternal Grandfather   . Diabetes Paternal Grandfather     fathers side of the family    Social History Social History  Substance Use Topics  . Smoking status: Former Smoker    Packs/day: 1.00    Years: 5.00    Types: Cigarettes    Start date: 05/11/1978    Quit date: 07/03/1982  . Smokeless tobacco: Never Used  . Alcohol use Yes     Comment: once q 6 months-socailly-wine     Allergies   Patient has no known  allergies.   Review of Systems Review of Systems  Constitutional: Positive for fatigue.  HENT: Positive for congestion.   Respiratory: Positive for cough, shortness of breath and wheezing.   Gastrointestinal: Negative.      Physical Exam Triage Vital Signs ED Triage Vitals  Enc Vitals Group     BP      Pulse      Resp      Temp      Temp src      SpO2      Weight      Height      Head Circumference      Peak Flow      Pain Score      Pain Loc      Pain Edu?      Excl. in [place]?    No data found.   Updated Vital Signs BP 143/81 (BP Location: Left Arm)   Pulse 73   Temp 98.5 F (36.9 C) (Oral)   Resp 20   SpO2 95%    Physical Exam  Constitutional: She is oriented to person, place, and time. She appears well-developed and well-nourished.  HENT:  Head: Normocephalic.  Right Ear: External ear normal.  Left Ear: External ear normal.  Mouth/Throat: Oropharynx is clear and moist.  Eyes: Conjunctivae and EOM are normal. Pupils are equal, round, and reactive to light.  Neck: Normal range of motion. Neck supple.  Cardiovascular: Normal rate, regular rhythm and normal heart sounds.   Pulmonary/Chest: She is in respiratory distress. She has wheezes. She has rales.  Mildly short of breath at rest  Abdominal: Soft.  Musculoskeletal: Normal range of motion. She exhibits no edema or deformity.  Neurological: She is alert and oriented to person, place, and time.  Skin: Skin is warm and dry.  Psychiatric: She has a normal mood and affect.  Nursing note and vitals reviewed.    UC Treatments / Results  Labs (all labs ordered are listed, but only abnormal results are displayed) Labs Reviewed -  No data to display  EKG  EKG Interpretation None       Radiology No results found.  Procedures Procedures (including critical care time)  Medications Ordered in UC Medications  albuterol (PROVENTIL) (2.5 MG/3ML) 0.083% nebulizer solution 2.5 mg (2.5 mg Nebulization  Given 09/03/16 1301)     Initial Impression / Assessment and Plan / UC Course  I have reviewed the triage vital signs and the nursing notes.  Pertinent labs & imaging results that were available during my care of the patient were reviewed by me and considered in my medical decision making (see chart for details).   good improvement after nebulized albuterol  Final Clinical Impressions(s) / UC Diagnoses   Final diagnoses:  Bronchitis    New Prescriptions New Prescriptions   ALBUTEROL (PROVENTIL HFA;VENTOLIN HFA) 108 (90 BASE) MCG/ACT INHALER    Inhale 2 puffs into the lungs every 4 (four) hours as needed for wheezing or shortness of breath (cough, shortness of breath or wheezing.).   AMOXICILLIN-CLAVULANATE (AUGMENTIN) 875-125 MG TABLET    Take 1 tablet by mouth every 12 (twelve) hours.     Robyn Haber, MD 09/03/16 1322

## 2016-09-05 DIAGNOSIS — Z85828 Personal history of other malignant neoplasm of skin: Secondary | ICD-10-CM | POA: Diagnosis not present

## 2016-09-05 DIAGNOSIS — C44319 Basal cell carcinoma of skin of other parts of face: Secondary | ICD-10-CM | POA: Diagnosis not present

## 2016-09-18 ENCOUNTER — Ambulatory Visit (INDEPENDENT_AMBULATORY_CARE_PROVIDER_SITE_OTHER): Payer: PPO | Admitting: Cardiology

## 2016-09-18 ENCOUNTER — Encounter: Payer: Self-pay | Admitting: Cardiology

## 2016-09-18 VITALS — BP 128/82 | HR 72 | Ht 66.5 in | Wt 265.4 lb

## 2016-09-18 DIAGNOSIS — Z01812 Encounter for preprocedural laboratory examination: Secondary | ICD-10-CM | POA: Diagnosis not present

## 2016-09-18 DIAGNOSIS — I480 Paroxysmal atrial fibrillation: Secondary | ICD-10-CM

## 2016-09-18 LAB — CBC WITH DIFFERENTIAL/PLATELET
Basophils Absolute: 0.1 10*3/uL (ref 0.0–0.2)
Basos: 1 %
EOS (ABSOLUTE): 0.4 10*3/uL (ref 0.0–0.4)
Eos: 4 %
Hematocrit: 43.6 % (ref 34.0–46.6)
Hemoglobin: 14.5 g/dL (ref 11.1–15.9)
Immature Grans (Abs): 0 10*3/uL (ref 0.0–0.1)
Immature Granulocytes: 0 %
Lymphocytes Absolute: 2.8 10*3/uL (ref 0.7–3.1)
Lymphs: 26 %
MCH: 31.1 pg (ref 26.6–33.0)
MCHC: 33.3 g/dL (ref 31.5–35.7)
MCV: 94 fL (ref 79–97)
Monocytes Absolute: 1 10*3/uL — ABNORMAL HIGH (ref 0.1–0.9)
Monocytes: 10 %
Neutrophils Absolute: 6.5 10*3/uL (ref 1.4–7.0)
Neutrophils: 59 %
Platelets: 295 10*3/uL (ref 150–379)
RBC: 4.66 x10E6/uL (ref 3.77–5.28)
RDW: 14.9 % (ref 12.3–15.4)
WBC: 10.7 10*3/uL (ref 3.4–10.8)

## 2016-09-18 LAB — BASIC METABOLIC PANEL
BUN/Creatinine Ratio: 18 (ref 12–28)
BUN: 20 mg/dL (ref 8–27)
CO2: 26 mmol/L (ref 18–29)
Calcium: 9.6 mg/dL (ref 8.7–10.3)
Chloride: 96 mmol/L (ref 96–106)
Creatinine, Ser: 1.11 mg/dL — ABNORMAL HIGH (ref 0.57–1.00)
GFR calc Af Amer: 58 mL/min/{1.73_m2} — ABNORMAL LOW (ref >59)
GFR calc non Af Amer: 50 mL/min/{1.73_m2} — ABNORMAL LOW (ref >59)
Glucose: 113 mg/dL — ABNORMAL HIGH (ref 65–99)
Potassium: 4 mmol/L (ref 3.5–5.2)
Sodium: 140 mmol/L (ref 134–144)

## 2016-09-18 NOTE — Addendum Note (Signed)
Addended by: Eulis Foster on: 09/18/2016 11:56 AM   Modules accepted: Orders

## 2016-09-18 NOTE — Patient Instructions (Signed)
Medication Instructions:    Your physician recommends that you continue on your current medications as directed. Please refer to the Current Medication list given to you today.  --- If you need a refill on your cardiac medications before your next appointment, please call your pharmacy. ---  Labwork:  Pre procedure labs today: BMET & CBC w/ diff  Testing/Procedures:  None ordered  Follow-Up:  Your physician recommends that you schedule a follow-up appointment in: 4 weeks, after your procedure on 09/29/16, with Roderic Palau in the AFib clinic   Your physician recommends that you schedule a follow-up appointment in: 3 months, after your procedure on 09/29/16, with Dr. Curt Bears.   Thank you for choosing [facility]!!   Trinidad Curet, RN [phone]

## 2016-09-18 NOTE — Progress Notes (Signed)
Electrophysiology Office Note   Date:  09/18/2016   ID:  [name], DOB 1945/11/10, MRN [id number]  PCP:  Lottie Dawson, MD  Primary Electrophysiologist: Gaye Alken, MD    Chief Complaint  Patient presents with  . Follow-up    PAF     History of Present Illness: [name] is a 71 y.o. female who presents today for electrophysiology evaluation.   She has a history of coronary artery disease status post stent to the RCA in 2004, CHF, atrial fibrillation with tachybradycardia syndrome status post pacemaker, CKD, hypertension, hyperlipidemia, inferior wall MI, sleep apnea on CPAP, stroke. She was doing well with her atrial fibrillation maintaining sinus rhythm on CPAP and dofetilide until this past summer where she developed an upper airway infection and required cardioversion. She subsequently developed atrial fibrillation 2 months ago. She is quite short of breath with her atrial fibrillation. She had a cardioversion 4 days ago,, but return to atrial fibrillation after 3 minutes. She has since started amiodarone. Plan for AF ablation 09/29/16.   Today, she denies symptoms of chest pain, shortness of breath, orthopnea, PND, lower extremity edema, claudication, dizziness, presyncope, syncope, bleeding, or neurologic sequela. The patient is tolerating medications without difficulties and is otherwise without complaint today.    Past Medical History:  Diagnosis Date  . Anticoagulant long-term use    pradaxa  . Anxiety   . Arthritis    low back & both knees, Spondylolisthesis    . CAD (coronary artery disease) 6195,0932   post PTCA with bare-metal stenting to mid RCA in December 2004     . CHF (congestive heart failure) ([place])   . Chronic atrial fibrillation ([place]) 06/2007   Tachybradycardia pacemaker  . Chronic kidney disease    10% function - ?R, other kidney is compensating    . Depression   . Diplopia 06/19/2008   Qualifier: Diagnosis of  By: [name] MD,  [name]   . Dysrhythmia    ATRIAL FIBRILATION  . Edema of lower extremity   . History of acute inferior wall MI   . History of CVA (cerebrovascular accident)   . Hyperlipidemia   . Hypertension   . Myocardial infarction 6712,4580  . OSA on CPAP    last test- 2010  . Pacemaker   . Pneumonia 2014   tx. ----  [facility]  . Shortness of breath   . Stroke ([place]) [id number]  . TIA (transient ischemic attack)   . Transfusion history   . Unspecified hemorrhoids without mention of complication 9/98/3382   Colonoscopy--Dr. Carlean Purl    Past Surgical History:  Procedure Laterality Date  . APPENDECTOMY    . back surgeries     x2-3 by Dr Trenton Gammon  . BACK SURGERY    . CHOLECYSTECTOMY  1994  . CORONARY ANGIOPLASTY WITH STENT PLACEMENT  1998  . CORONARY STENT PLACEMENT     C stent  . DOPPLER ECHOCARDIOGRAPHY  2009  . ELECTROPHYSIOLOGIC STUDY N/A 03/31/2016   Procedure: Cardioversion;  Surgeon: Evans Lance, MD;  Location: [facility];  Service: Cardiovascular;  Laterality: N/A;  . ELECTROPHYSIOLOGIC STUDY N/A 08/04/2016   Procedure: Cardioversion;  Surgeon: Evans Lance, MD;  Location: [facility];  Service: Cardiovascular;  Laterality: N/A;  . LEFT AND RIGHT HEART CATHETERIZATION WITH CORONARY ANGIOGRAM N/A 10/06/2013   Procedure: LEFT AND RIGHT HEART CATHETERIZATION WITH CORONARY ANGIOGRAM;  Surgeon: Troy Sine, MD;  [facility] CATH LAB;  Service:  Cardiovascular;  Laterality: N/A;  . permanent pacemaker  06/2007  . TOTAL ABDOMINAL HYSTERECTOMY  1984   2 surgeries endometriosis bso     Current Outpatient Prescriptions  Medication Sig Dispense Refill  . acetaminophen (TYLENOL) 500 MG tablet Take 500 mg by mouth every 12 (twelve) hours as needed. Knee Pain    . amiodarone (PACERONE) 200 MG tablet Take 1 tablet (200 mg total) by mouth 2 (two) times daily. Start 08/06/16 - stop Tikosyn today 60 tablet 4  . apixaban (ELIQUIS) 5 MG TABS tablet Take 1 tablet (5 mg total) by  mouth 2 (two) times daily. 60 tablet 6  . atorvastatin (LIPITOR) 40 MG tablet TAKE 1 TABLET BY MOUTH EVERY DAY 90 tablet 2  . Cyanocobalamin (B-12) 5000 MCG CAPS Take 5,000 mcg by mouth daily at 12 noon.     . diltiazem (CARDIZEM CD) 360 MG 24 hr capsule Take 1 capsule (360 mg total) by mouth daily. 30 capsule 11  . FLUoxetine (PROZAC) 20 MG capsule TAKE ONE CAPSULE BY MOUTH EVERY MORNING 90 capsule 1  . fluticasone (FLONASE) 50 MCG/ACT nasal spray Place 1 spray into both nostrils daily as needed for allergies or rhinitis.    . furosemide (LASIX) 40 MG tablet 2 tablets (80mg ) by mouth in the AM and 1 tablet (40mg ) by mouth in the PM 360 tablet 0  . KLOR-CON M20 20 MEQ tablet TAKE 1 TABLET BY MOUTH TWICE A DAY 180 tablet 3  . losartan (COZAAR) 50 MG tablet Take 1 tablet (50 mg total) by mouth daily. 30 tablet 11  . Lysine HCl 1000 MG TABS Take 1,000 mg by mouth at bedtime.     . metoprolol succinate (TOPROL-XL) 100 MG 24 hr tablet Take 1 tablet (100 mg total) by mouth 2 (two) times daily. 180 tablet 3  . Multiple Vitamin (MULTIVITAMIN WITH MINERALS) TABS tablet Take 1 tablet by mouth daily. Centrum    . nitroGLYCERIN (NITROSTAT) 0.4 MG SL tablet Place 1 tablet (0.4 mg total) under the tongue every 5 (five) minutes x 3 doses as needed for chest pain. 25 tablet 6  . Polyethyl Glycol-Propyl Glycol (SYSTANE OP) Place 1 drop into both eyes daily as needed (dry eyes).    [name] PRESCRIPTION MEDICATION Inhale into the lungs at bedtime. CPAP     No current facility-administered medications for this visit.     Allergies:   Patient has no known allergies.   Social History:  The patient  reports that she quit smoking about 34 years ago. Her smoking use included Cigarettes. She started smoking about 38 years ago. She has a 5.00 pack-year smoking history. She has never used smokeless tobacco. She reports that she drinks alcohol. She reports that she does not use drugs.   Family History:  The patient's family  history includes Arrhythmia in her mother; Diabetes in her mother and paternal grandfather; Heart attack (age of onset: 68) in her brother; Heart disease in her paternal aunt; Hypertension in her mother; Prostate cancer in her maternal grandfather; Suicidality in her father.    ROS:  Please see the history of present illness.   Otherwise, review of systems is positive for palpitations, back pain, balance problems, dizziness.   All other systems are reviewed and negative.    PHYSICAL EXAM: VS:  BP 128/82   Pulse 72   Ht 5' 6.5" (1.689 m)   Wt 265 lb 6.4 oz (120.4 kg)   BMI 42.19 kg/m  , BMI Body mass index  is 42.19 kg/m. GEN: Well nourished, well developed, in no acute distress  HEENT: normal  Neck: no JVD, carotid bruits, or masses Cardiac: iRRR; no murmurs, rubs, or gallops,no edema  Respiratory:  clear to auscultation bilaterally, normal work of breathing GI: soft, nontender, nondistended, + BS MS: no deformity or atrophy  Skin: warm and dry,  device pocket is well healed Neuro:  Strength and sensation are intact Psych: euthymic mood, full affect  EKG:  EKG is ordered today. Personal review of the ekg ordered 08/08/16 shows atrial fibrillation, rate 96   Device interrogation is reviewed today in detail.  See PaceArt for details.   Recent Labs: 03/26/2016: B Natriuretic Peptide 553.9 04/01/2016: ALT 53; Hemoglobin 13.7 07/24/2016: TSH 3.56 07/28/2016: Magnesium 2.3 08/02/2016: BUN 26; Creatinine, Ser 0.94; Platelets 327; Potassium 4.9; Sodium 142    Lipid Panel     Component Value Date/Time   CHOL 100 03/31/2016 0248   TRIG 143 03/31/2016 0248   HDL 22 (L) 03/31/2016 0248   CHOLHDL 4.5 03/31/2016 0248   VLDL 29 03/31/2016 0248   LDLCALC 49 03/31/2016 0248     Wt Readings from Last 3 Encounters:  09/18/16 265 lb 6.4 oz (120.4 kg)  08/08/16 256 lb 6.4 oz (116.3 kg)  08/04/16 260 lb (117.9 kg)      Other studies Reviewed: Additional studies/ records that were  reviewed today include: TTE 03/28/16  Review of the above records today demonstrates:  - Left ventricle: The cavity size was normal. Wall thickness was   increased in a pattern of mild LVH. Systolic function was normal.   The estimated ejection fraction was in the range of 55% to 60%.   Indeterminant diastolic function (atrial fibrillation). Wall   motion was normal; there were no regional wall motion   abnormalities. - Aortic valve: There was no stenosis. - Mitral valve: Mildly calcified annulus. There was no significant   regurgitation. - Left atrium: The atrium was mildly dilated. - Right ventricle: The cavity size was normal. Pacer wire or   catheter noted in right ventricle. Systolic function was normal. - Tricuspid valve: Peak RV-RA gradient (S): 22 mm Hg. - Pulmonary arteries: PA peak pressure: 30 mm Hg (S). - Systemic veins: IVC measured 2.3 cm with normal respirophasic   variation, suggesting RA pressure 8 mmHg.   ASSESSMENT AND PLAN:  1.  Paroxysmal atrial fibrillation: Has controlled ventricular rates, but does have symptoms of shortness of breath and fatigue. Had unsuccessful cardioversion 08/04/16. AF ablation scheduled 09/29/16.  Risks and benefits of the procedure were discussed. Risks include bleeding, tamponade, heart block, stroke, and damage to surrounding organs, among others. She understands these risks and has agreed to ablation.  2. Hypertension: Well-controlled on current medications  3. Obesity: Weight loss encouraged  4. Chronic diastolic heart failure: Currently well compensated.  5. Obstructive sleep apnea: on CPAP    Current medicines are reviewed at length with the patient today.   The patient does not have concerns regarding her medicines.  The following changes were made today:  none  Labs/ tests ordered today include: CBC, BMP No orders of the defined types were placed in this encounter.    Disposition:   FU with Quanita Barona 3  months  Signed, Sanyiah Kanzler Meredith Leeds, MD  09/18/2016 11:40 AM     [facility] [address]  [place] [address] [phone] (office) [phone] (fax)

## 2016-09-25 ENCOUNTER — Ambulatory Visit (HOSPITAL_COMMUNITY)
Admission: RE | Admit: 2016-09-25 | Discharge: 2016-09-25 | Disposition: A | Discharge status: Home / Self Care | Payer: PPO | Source: Ambulatory Visit | Attending: Cardiology | Admitting: Cardiology

## 2016-09-25 DIAGNOSIS — R9439 Abnormal result of other cardiovascular function study: Secondary | ICD-10-CM | POA: Diagnosis not present

## 2016-09-25 DIAGNOSIS — Z955 Presence of coronary angioplasty implant and graft: Secondary | ICD-10-CM | POA: Diagnosis not present

## 2016-09-25 DIAGNOSIS — I2510 Atherosclerotic heart disease of native coronary artery without angina pectoris: Secondary | ICD-10-CM | POA: Diagnosis not present

## 2016-09-25 DIAGNOSIS — I4891 Unspecified atrial fibrillation: Secondary | ICD-10-CM | POA: Diagnosis not present

## 2016-09-25 DIAGNOSIS — I480 Paroxysmal atrial fibrillation: Secondary | ICD-10-CM | POA: Insufficient documentation

## 2016-09-25 DIAGNOSIS — Z950 Presence of cardiac pacemaker: Secondary | ICD-10-CM | POA: Insufficient documentation

## 2016-09-25 MED ORDER — METOPROLOL TARTRATE 5 MG/5ML IV SOLN
INTRAVENOUS | Status: AC
Start: 2016-09-25 — End: 2016-09-25
  Filled 2016-09-25: qty 5

## 2016-09-25 MED ORDER — NITROGLYCERIN 0.4 MG SL SUBL
SUBLINGUAL_TABLET | SUBLINGUAL | Status: AC
  Filled 2016-09-25: qty 2

## 2016-09-25 MED ORDER — METOPROLOL TARTRATE 5 MG/5ML IV SOLN: 5.0000 mg | Freq: Once | INTRAVENOUS | Status: DC

## 2016-09-25 MED ORDER — IOPAMIDOL (ISOVUE-370) INJECTION 76%
INTRAVENOUS | Status: AC
  Administered 2016-09-25: 100 mL
  Filled 2016-09-25: qty 100

## 2016-09-25 MED ORDER — METOPROLOL TARTRATE 5 MG/5ML IV SOLN
5.0000 mg | INTRAVENOUS | Status: DC | PRN
Start: 2016-09-25 — End: 2016-09-26
  Administered 2016-09-25 (×2): 5 mg via INTRAVENOUS

## 2016-09-25 MED ORDER — METOPROLOL TARTRATE 5 MG/5ML IV SOLN
INTRAVENOUS | Status: AC
  Filled 2016-09-25: qty 15

## 2016-09-25 MED ORDER — NITROGLYCERIN 0.4 MG SL SUBL: 0.4000 mg | SUBLINGUAL_TABLET | SUBLINGUAL | Status: DC | PRN

## 2016-09-25 MED ORDER — NITROGLYCERIN 0.4 MG SL SUBL
0.8000 mg | SUBLINGUAL_TABLET | Freq: Once | SUBLINGUAL | Status: DC
Start: 2016-09-25 — End: 2016-09-26

## 2016-09-27 ENCOUNTER — Other Ambulatory Visit: Payer: Self-pay | Admitting: Cardiology

## 2016-09-27 DIAGNOSIS — I4819 Other persistent atrial fibrillation: Secondary | ICD-10-CM

## 2016-09-29 ENCOUNTER — Ambulatory Visit (HOSPITAL_BASED_OUTPATIENT_CLINIC_OR_DEPARTMENT_OTHER): Payer: PPO

## 2016-09-29 ENCOUNTER — Ambulatory Visit (HOSPITAL_COMMUNITY)
Admission: RE | Disposition: A | Discharge status: Home / Self Care | Payer: Self-pay | Source: Ambulatory Visit | Attending: Cardiology

## 2016-09-29 ENCOUNTER — Ambulatory Visit (HOSPITAL_COMMUNITY)
Admission: RE | Admit: 2016-09-29 | Discharge: 2016-10-01 | Disposition: A | Discharge status: Home / Self Care | Payer: PPO | Source: Ambulatory Visit | Attending: Cardiology | Admitting: Cardiology

## 2016-09-29 ENCOUNTER — Ambulatory Visit (HOSPITAL_COMMUNITY): Payer: PPO | Admitting: Anesthesiology

## 2016-09-29 ENCOUNTER — Encounter (HOSPITAL_COMMUNITY): Payer: Self-pay | Admitting: *Deleted

## 2016-09-29 ENCOUNTER — Encounter (HOSPITAL_COMMUNITY)
Admission: RE | Disposition: A | Discharge status: Home / Self Care | Payer: Self-pay | Source: Ambulatory Visit | Attending: Cardiology

## 2016-09-29 DIAGNOSIS — I480 Paroxysmal atrial fibrillation: Secondary | ICD-10-CM | POA: Diagnosis present

## 2016-09-29 DIAGNOSIS — Z87891 Personal history of nicotine dependence: Secondary | ICD-10-CM | POA: Insufficient documentation

## 2016-09-29 DIAGNOSIS — Y838 Other surgical procedures as the cause of abnormal reaction of the patient, or of later complication, without mention of misadventure at the time of the procedure: Secondary | ICD-10-CM | POA: Diagnosis not present

## 2016-09-29 DIAGNOSIS — Z8673 Personal history of transient ischemic attack (TIA), and cerebral infarction without residual deficits: Secondary | ICD-10-CM | POA: Diagnosis not present

## 2016-09-29 DIAGNOSIS — I484 Atypical atrial flutter: Secondary | ICD-10-CM | POA: Insufficient documentation

## 2016-09-29 DIAGNOSIS — I97638 Postprocedural hematoma of a circulatory system organ or structure following other circulatory system procedure: Secondary | ICD-10-CM | POA: Diagnosis not present

## 2016-09-29 DIAGNOSIS — I481 Persistent atrial fibrillation: Secondary | ICD-10-CM | POA: Diagnosis not present

## 2016-09-29 DIAGNOSIS — N189 Chronic kidney disease, unspecified: Secondary | ICD-10-CM | POA: Insufficient documentation

## 2016-09-29 DIAGNOSIS — Z7901 Long term (current) use of anticoagulants: Secondary | ICD-10-CM

## 2016-09-29 DIAGNOSIS — I4891 Unspecified atrial fibrillation: Secondary | ICD-10-CM

## 2016-09-29 DIAGNOSIS — E785 Hyperlipidemia, unspecified: Secondary | ICD-10-CM | POA: Insufficient documentation

## 2016-09-29 DIAGNOSIS — G4733 Obstructive sleep apnea (adult) (pediatric): Secondary | ICD-10-CM | POA: Insufficient documentation

## 2016-09-29 DIAGNOSIS — I4892 Unspecified atrial flutter: Secondary | ICD-10-CM | POA: Diagnosis not present

## 2016-09-29 DIAGNOSIS — I2510 Atherosclerotic heart disease of native coronary artery without angina pectoris: Secondary | ICD-10-CM

## 2016-09-29 DIAGNOSIS — I959 Hypotension, unspecified: Secondary | ICD-10-CM | POA: Insufficient documentation

## 2016-09-29 DIAGNOSIS — Z950 Presence of cardiac pacemaker: Secondary | ICD-10-CM | POA: Diagnosis present

## 2016-09-29 DIAGNOSIS — M479 Spondylosis, unspecified: Secondary | ICD-10-CM | POA: Insufficient documentation

## 2016-09-29 DIAGNOSIS — I340 Nonrheumatic mitral (valve) insufficiency: Secondary | ICD-10-CM

## 2016-09-29 DIAGNOSIS — Z6841 Body Mass Index (BMI) 40.0 and over, adult: Secondary | ICD-10-CM | POA: Insufficient documentation

## 2016-09-29 DIAGNOSIS — I5032 Chronic diastolic (congestive) heart failure: Secondary | ICD-10-CM | POA: Insufficient documentation

## 2016-09-29 DIAGNOSIS — M170 Bilateral primary osteoarthritis of knee: Secondary | ICD-10-CM | POA: Insufficient documentation

## 2016-09-29 DIAGNOSIS — I130 Hypertensive heart and chronic kidney disease with heart failure and stage 1 through stage 4 chronic kidney disease, or unspecified chronic kidney disease: Secondary | ICD-10-CM | POA: Insufficient documentation

## 2016-09-29 DIAGNOSIS — Z955 Presence of coronary angioplasty implant and graft: Secondary | ICD-10-CM | POA: Insufficient documentation

## 2016-09-29 DIAGNOSIS — E669 Obesity, unspecified: Secondary | ICD-10-CM | POA: Diagnosis present

## 2016-09-29 DIAGNOSIS — I495 Sick sinus syndrome: Secondary | ICD-10-CM | POA: Diagnosis not present

## 2016-09-29 DIAGNOSIS — I10 Essential (primary) hypertension: Secondary | ICD-10-CM | POA: Diagnosis not present

## 2016-09-29 DIAGNOSIS — F419 Anxiety disorder, unspecified: Secondary | ICD-10-CM | POA: Insufficient documentation

## 2016-09-29 DIAGNOSIS — I252 Old myocardial infarction: Secondary | ICD-10-CM | POA: Insufficient documentation

## 2016-09-29 DIAGNOSIS — F329 Major depressive disorder, single episode, unspecified: Secondary | ICD-10-CM | POA: Insufficient documentation

## 2016-09-29 DIAGNOSIS — I5033 Acute on chronic diastolic (congestive) heart failure: Secondary | ICD-10-CM | POA: Diagnosis not present

## 2016-09-29 DIAGNOSIS — Z9861 Coronary angioplasty status: Secondary | ICD-10-CM

## 2016-09-29 DIAGNOSIS — Z7951 Long term (current) use of inhaled steroids: Secondary | ICD-10-CM | POA: Insufficient documentation

## 2016-09-29 DIAGNOSIS — I4819 Other persistent atrial fibrillation: Secondary | ICD-10-CM | POA: Insufficient documentation

## 2016-09-29 HISTORY — PX: TEE WITHOUT CARDIOVERSION: SHX5443

## 2016-09-29 HISTORY — PX: ATRIAL FIBRILLATION ABLATION: EP1191

## 2016-09-29 LAB — POCT ACTIVATED CLOTTING TIME
Activated Clotting Time: 285 seconds
Activated Clotting Time: 290 seconds
Activated Clotting Time: 268 seconds
Activated Clotting Time: 335 seconds
Activated Clotting Time: 180 seconds

## 2016-09-29 SURGERY — ATRIAL FIBRILLATION ABLATION
Anesthesia: General

## 2016-09-29 SURGERY — ECHOCARDIOGRAM, TRANSESOPHAGEAL
Anesthesia: Moderate Sedation

## 2016-09-29 MED ORDER — APIXABAN 5 MG PO TABS
5.0000 mg | ORAL_TABLET | Freq: Two times a day (BID) | ORAL | Status: DC
  Administered 2016-09-29: 5 mg via ORAL
  Filled 2016-09-29: qty 1

## 2016-09-29 MED ORDER — ACETAMINOPHEN 500 MG PO TABS
1000.0000 mg | ORAL_TABLET | Freq: Two times a day (BID) | ORAL | Status: DC
Start: 2016-09-29 — End: 2016-10-01
  Administered 2016-09-29 – 2016-10-01 (×3): 1000 mg via ORAL
  Filled 2016-09-29 (×3): qty 2

## 2016-09-29 MED ORDER — PHENYLEPHRINE HCL 10 MG/ML IJ SOLN
INTRAVENOUS | Status: DC | PRN
  Administered 2016-09-29: 25 ug/min via INTRAVENOUS

## 2016-09-29 MED ORDER — PROTAMINE SULFATE 10 MG/ML IV SOLN
INTRAVENOUS | Status: DC | PRN
  Administered 2016-09-29: 40 mg via INTRAVENOUS

## 2016-09-29 MED ORDER — ONDANSETRON HCL 4 MG/2ML IJ SOLN
INTRAMUSCULAR | Status: DC | PRN
  Administered 2016-09-29: 4 mg via INTRAVENOUS

## 2016-09-29 MED ORDER — SODIUM CHLORIDE 0.9 % IV SOLN: 250.0000 mL | INTRAVENOUS | Status: DC | PRN

## 2016-09-29 MED ORDER — SUGAMMADEX SODIUM 500 MG/5ML IV SOLN
INTRAVENOUS | Status: DC | PRN
  Administered 2016-09-29: 250 mg via INTRAVENOUS

## 2016-09-29 MED ORDER — DILTIAZEM HCL ER COATED BEADS 360 MG PO CP24
360.0000 mg | ORAL_CAPSULE | Freq: Every day | ORAL | Status: DC
  Administered 2016-09-29: 360 mg via ORAL
  Filled 2016-09-29: qty 1
  Filled 2016-09-29: qty 2
  Filled 2016-09-29: qty 1

## 2016-09-29 MED ORDER — SODIUM CHLORIDE 0.9% FLUSH: 3.0000 mL | INTRAVENOUS | Status: DC | PRN

## 2016-09-29 MED ORDER — ROCURONIUM BROMIDE 10 MG/ML (PF) SYRINGE
PREFILLED_SYRINGE | INTRAVENOUS | Status: DC | PRN
  Administered 2016-09-29: 50 mg via INTRAVENOUS

## 2016-09-29 MED ORDER — CALCIUM CARBONATE-VITAMIN D 500-200 MG-UNIT PO TABS
1.0000 | ORAL_TABLET | Freq: Every day | ORAL | Status: DC
  Administered 2016-09-30 – 2016-10-01 (×2): 1 via ORAL
  Filled 2016-09-29 (×2): qty 1

## 2016-09-29 MED ORDER — DOBUTAMINE IN D5W 4-5 MG/ML-% IV SOLN
INTRAVENOUS | Status: DC | PRN
  Administered 2016-09-29: 20 ug/kg/min via INTRAVENOUS

## 2016-09-29 MED ORDER — POLYVINYL ALCOHOL 1.4 % OP SOLN
1.0000 [drp] | Freq: Every day | OPHTHALMIC | Status: DC | PRN
  Filled 2016-09-29: qty 15

## 2016-09-29 MED ORDER — HEPARIN SODIUM (PORCINE) 1000 UNIT/ML IJ SOLN
INTRAMUSCULAR | Status: AC
  Filled 2016-09-29: qty 1

## 2016-09-29 MED ORDER — FLUOXETINE HCL 20 MG PO CAPS
20.0000 mg | ORAL_CAPSULE | Freq: Every morning | ORAL | Status: DC
  Administered 2016-09-30 – 2016-10-01 (×2): 20 mg via ORAL
  Filled 2016-09-29 (×2): qty 1

## 2016-09-29 MED ORDER — ATORVASTATIN CALCIUM 40 MG PO TABS
40.0000 mg | ORAL_TABLET | Freq: Every day | ORAL | Status: DC
  Administered 2016-09-29 – 2016-10-01 (×3): 40 mg via ORAL
  Filled 2016-09-29 (×3): qty 1

## 2016-09-29 MED ORDER — SODIUM CHLORIDE 0.9 % IV SOLN
INTRAVENOUS | Status: DC | PRN
  Administered 2016-09-29 (×2): via INTRAVENOUS

## 2016-09-29 MED ORDER — SUCCINYLCHOLINE CHLORIDE 200 MG/10ML IV SOSY
PREFILLED_SYRINGE | INTRAVENOUS | Status: DC | PRN
  Administered 2016-09-29: 160 mg via INTRAVENOUS

## 2016-09-29 MED ORDER — FENTANYL CITRATE (PF) 100 MCG/2ML IJ SOLN
INTRAMUSCULAR | Status: AC
  Filled 2016-09-29: qty 2

## 2016-09-29 MED ORDER — B-12 5000 MCG PO CAPS: 5000.0000 ug | ORAL_CAPSULE | Freq: Every day | ORAL | Status: DC

## 2016-09-29 MED ORDER — VITAMIN C 500 MG PO TABS: 1000.0000 mg | ORAL_TABLET | Freq: Every day | ORAL | Status: DC

## 2016-09-29 MED ORDER — BUPIVACAINE HCL (PF) 0.25 % IJ SOLN
INTRAMUSCULAR | Status: DC | PRN
  Administered 2016-09-29: 30 mL

## 2016-09-29 MED ORDER — HEPARIN SODIUM (PORCINE) 1000 UNIT/ML IJ SOLN
INTRAMUSCULAR | Status: DC | PRN
  Administered 2016-09-29: 4000 [IU] via INTRAVENOUS
  Administered 2016-09-29: 6000 [IU] via INTRAVENOUS
  Administered 2016-09-29: 14000 [IU] via INTRAVENOUS
  Administered 2016-09-29: 4000 [IU] via INTRAVENOUS

## 2016-09-29 MED ORDER — HEPARIN (PORCINE) IN NACL 2-0.9 UNIT/ML-% IJ SOLN
INTRAMUSCULAR | Status: AC
  Filled 2016-09-29: qty 500

## 2016-09-29 MED ORDER — FENTANYL CITRATE (PF) 100 MCG/2ML IJ SOLN
INTRAMUSCULAR | Status: DC | PRN
  Administered 2016-09-29 (×2): 25 ug via INTRAVENOUS

## 2016-09-29 MED ORDER — MIDAZOLAM HCL 10 MG/2ML IJ SOLN
INTRAMUSCULAR | Status: DC | PRN
  Administered 2016-09-29 (×2): 2 mg via INTRAVENOUS

## 2016-09-29 MED ORDER — OFF THE BEAT BOOK
Freq: Once | Status: AC
  Administered 2016-09-29: 21:00:00
  Filled 2016-09-29: qty 1

## 2016-09-29 MED ORDER — FENTANYL CITRATE (PF) 100 MCG/2ML IJ SOLN: 25.0000 ug | INTRAMUSCULAR | Status: DC | PRN

## 2016-09-29 MED ORDER — PROPOFOL 10 MG/ML IV BOLUS
INTRAVENOUS | Status: DC | PRN
  Administered 2016-09-29: 70 mg via INTRAVENOUS
  Administered 2016-09-29: 160 mg via INTRAVENOUS

## 2016-09-29 MED ORDER — LOSARTAN POTASSIUM 50 MG PO TABS: 50.0000 mg | ORAL_TABLET | Freq: Every day | ORAL | Status: DC

## 2016-09-29 MED ORDER — FENTANYL CITRATE (PF) 100 MCG/2ML IJ SOLN
INTRAMUSCULAR | Status: DC | PRN
  Administered 2016-09-29: 100 ug via INTRAVENOUS

## 2016-09-29 MED ORDER — EPHEDRINE SULFATE 50 MG/ML IJ SOLN
INTRAMUSCULAR | Status: DC | PRN
  Administered 2016-09-29: 10 mg via INTRAVENOUS

## 2016-09-29 MED ORDER — VITAMIN B-12 1000 MCG PO TABS
5000.0000 ug | ORAL_TABLET | Freq: Every day | ORAL | Status: DC
  Administered 2016-09-30: 5000 ug via ORAL
  Filled 2016-09-29: qty 5

## 2016-09-29 MED ORDER — DOBUTAMINE IN D5W 4-5 MG/ML-% IV SOLN
INTRAVENOUS | Status: AC
  Filled 2016-09-29: qty 250

## 2016-09-29 MED ORDER — NITROGLYCERIN 0.4 MG SL SUBL: 0.4000 mg | SUBLINGUAL_TABLET | SUBLINGUAL | Status: DC | PRN

## 2016-09-29 MED ORDER — VITAMIN E 45 MG (100 UNIT) PO CAPS
1000.0000 [IU] | ORAL_CAPSULE | Freq: Every day | ORAL | Status: DC
  Administered 2016-09-30 – 2016-10-01 (×2): 1000 [IU] via ORAL
  Filled 2016-09-29 (×2): qty 2

## 2016-09-29 MED ORDER — VITAMIN C 500 MG PO TABS
1000.0000 mg | ORAL_TABLET | Freq: Every day | ORAL | Status: DC
  Administered 2016-09-30 – 2016-10-01 (×2): 1000 mg via ORAL
  Filled 2016-09-29 (×2): qty 2

## 2016-09-29 MED ORDER — LYSINE HCL 1000 MG PO TABS: 1000.0000 mg | ORAL_TABLET | Freq: Every day | ORAL | Status: DC

## 2016-09-29 MED ORDER — HEPARIN SODIUM (PORCINE) 1000 UNIT/ML IJ SOLN
INTRAMUSCULAR | Status: DC | PRN
  Administered 2016-09-29: 1000 [IU] via INTRAVENOUS

## 2016-09-29 MED ORDER — ACETAMINOPHEN 325 MG PO TABS
650.0000 mg | ORAL_TABLET | ORAL | Status: DC | PRN
  Administered 2016-09-30: 650 mg via ORAL
  Filled 2016-09-29 (×2): qty 2

## 2016-09-29 MED ORDER — BUPIVACAINE HCL (PF) 0.25 % IJ SOLN
INTRAMUSCULAR | Status: AC
  Filled 2016-09-29: qty 30

## 2016-09-29 MED ORDER — FLUTICASONE PROPIONATE 50 MCG/ACT NA SUSP: 1.0000 | Freq: Every day | NASAL | Status: DC | PRN

## 2016-09-29 MED ORDER — LIDOCAINE 2% (20 MG/ML) 5 ML SYRINGE
INTRAMUSCULAR | Status: DC | PRN
  Administered 2016-09-29: 40 mg via INTRAVENOUS

## 2016-09-29 MED ORDER — SODIUM CHLORIDE 0.9% FLUSH
3.0000 mL | Freq: Two times a day (BID) | INTRAVENOUS | Status: DC
  Administered 2016-09-30: 3 mL via INTRAVENOUS

## 2016-09-29 MED ORDER — SODIUM CHLORIDE 0.9 % IV SOLN
INTRAVENOUS | Status: DC
  Administered 2016-09-29: 08:00:00 via INTRAVENOUS

## 2016-09-29 MED ORDER — DEXTROSE 5 % IV SOLN
3.0000 g | Freq: Once | INTRAVENOUS | Status: AC
  Administered 2016-09-29: 3 g via INTRAVENOUS
  Filled 2016-09-29: qty 3000

## 2016-09-29 MED ORDER — MIDAZOLAM HCL 5 MG/ML IJ SOLN
INTRAMUSCULAR | Status: AC
  Filled 2016-09-29: qty 2

## 2016-09-29 MED ORDER — METOPROLOL SUCCINATE ER 100 MG PO TB24
100.0000 mg | ORAL_TABLET | Freq: Two times a day (BID) | ORAL | Status: DC
  Administered 2016-09-29: 21:00:00 100 mg via ORAL
  Filled 2016-09-29 (×2): qty 1

## 2016-09-29 MED ORDER — ONDANSETRON HCL 4 MG/2ML IJ SOLN: 4.0000 mg | Freq: Four times a day (QID) | INTRAMUSCULAR | Status: DC | PRN

## 2016-09-29 MED ORDER — VITAMIN E 45 MG (100 UNIT) PO CAPS: 1000.0000 [IU] | ORAL_CAPSULE | Freq: Every day | ORAL | Status: DC

## 2016-09-29 MED ORDER — POLYETHYL GLYCOL-PROPYL GLYCOL 0.4-0.3 % OP GEL: Freq: Every day | OPHTHALMIC | Status: DC | PRN

## 2016-09-29 MED ORDER — ONDANSETRON HCL 4 MG/2ML IJ SOLN: 4.0000 mg | Freq: Once | INTRAMUSCULAR | Status: DC | PRN

## 2016-09-29 MED ORDER — AMIODARONE HCL 200 MG PO TABS
200.0000 mg | ORAL_TABLET | Freq: Two times a day (BID) | ORAL | Status: DC
  Administered 2016-09-29 – 2016-10-01 (×4): 200 mg via ORAL
  Filled 2016-09-29 (×4): qty 1

## 2016-09-29 MED ORDER — BUTAMBEN-TETRACAINE-BENZOCAINE 2-2-14 % EX AERO
INHALATION_SPRAY | CUTANEOUS | Status: DC | PRN
  Administered 2016-09-29: 2 via TOPICAL

## 2016-09-29 MED ORDER — ADULT MULTIVITAMIN W/MINERALS CH
1.0000 | ORAL_TABLET | Freq: Every day | ORAL | Status: DC
  Administered 2016-09-29 – 2016-10-01 (×3): 1 via ORAL
  Filled 2016-09-29 (×3): qty 1

## 2016-09-29 MED ORDER — CALCIUM CARBONATE-VITAMIN D 600-400 MG-UNIT PO TABS: ORAL_TABLET | Freq: Every day | ORAL | Status: DC

## 2016-09-29 MED ORDER — OXYCODONE HCL 5 MG/5ML PO SOLN: 5.0000 mg | Freq: Once | ORAL | Status: DC | PRN

## 2016-09-29 MED ORDER — OXYCODONE HCL 5 MG PO TABS: 5.0000 mg | ORAL_TABLET | Freq: Once | ORAL | Status: DC | PRN

## 2016-09-29 MED ORDER — POTASSIUM CHLORIDE CRYS ER 20 MEQ PO TBCR
20.0000 meq | EXTENDED_RELEASE_TABLET | Freq: Two times a day (BID) | ORAL | Status: DC
  Administered 2016-09-29 – 2016-10-01 (×4): 20 meq via ORAL
  Filled 2016-09-29 (×4): qty 1

## 2016-09-29 SURGICAL SUPPLY — 20 items
BAG SNAP BAND KOVER 36X36 (MISCELLANEOUS) ×3 IMPLANT
BLANKET WARM UNDERBOD FULL ACC (MISCELLANEOUS) ×3 IMPLANT
CATH DUODECA/ISMUS 7FR REPROC (CATHETERS) ×2 IMPLANT
CATH SMTCH THERMOCOOL SF DF (CATHETERS) ×2 IMPLANT
CATH VARIABLE LASSO NAV 2515 (CATHETERS) ×2 IMPLANT
CATH WEBSTER BI DIR CS D-F CRV (CATHETERS) ×2 IMPLANT
COVER SWIFTLINK CONNECTOR (BAG) ×3 IMPLANT
HOVERMATT SINGLE USE (MISCELLANEOUS) ×2 IMPLANT
NDL TRANSSEPTAL BRK 98CM (NEEDLE) IMPLANT
NEEDLE TRANSSEPTAL BRK 98CM (NEEDLE) ×3 IMPLANT
PACK EP LATEX FREE (CUSTOM PROCEDURE TRAY) ×3
PACK EP LF (CUSTOM PROCEDURE TRAY) ×1 IMPLANT
PAD DEFIB LIFELINK (PAD) ×3 IMPLANT
PATCH CARTO3 (PAD) ×2 IMPLANT
SHEATH AGILIS NXT 8.5F 71CM (SHEATH) ×4 IMPLANT
SHEATH PINNACLE 7F 10CM (SHEATH) ×2 IMPLANT
SHEATH PINNACLE 8F 10CM (SHEATH) ×4 IMPLANT
SHEATH PINNACLE 9F 10CM (SHEATH) ×6 IMPLANT
SOUNDSTAR ECO 8FR (CATHETERS) ×2 IMPLANT
TUBING SMART ABLATE COOLFLOW (TUBING) ×2 IMPLANT

## 2016-09-29 NOTE — Progress Notes (Addendum)
Site area: Right groin a 9 french venous sheath X2 was removed  Site Prior to Removal:  Level 0  Pressure Applied For 20 MINUTES    Bedrest Beginning at 1800p    Manual:   Yes.    Patient Status During Pull:  stable  Post Pull Groin Site:  Level 0  Post Pull Instructions Given:  Yes.    Post Pull Pulses Present:  Yes.    Dressing Applied:  Yes.  Pressure dressing applied  Comments:  VS remain stable during sheath pull.

## 2016-09-29 NOTE — Transfer of Care (Signed)
Immediate Anesthesia Transfer of Care Note  Patient: [name]  Procedure(s) Performed: Procedure(s): Atrial Fibrillation Ablation (N/A)  Patient Location: Cath Lab  Anesthesia Type:General  Level of Consciousness: awake, alert , oriented and patient cooperative  Airway & Oxygen Therapy: Patient Spontanous Breathing and Patient connected to face mask oxygen  Post-op Assessment: Report given to RN and Post -op Vital signs reviewed and stable  Post vital signs: Reviewed and stable  Last Vitals:  Vitals:   09/29/16 1000 09/29/16 1020  BP: 128/60 (!) 117/50  Pulse: 74   Resp: 20   Temp:      Last Pain:  Vitals:   09/29/16 0805  TempSrc: Oral         Complications: No apparent anesthesia complications

## 2016-09-29 NOTE — CV Procedure (Signed)
     TEE  Indication: AFIB. ?LA mass  Time out performed.  During this procedure the patient is administered a total of Versed 4 mg and Fentanyl 50 mcg to achieve and maintain moderate conscious sedation.  The patient's heart rate, blood pressure, and oxygen saturation are monitored continuously during the procedure. The period of conscious sedation is 20 minutes, of which I was present face-to-face 100% of this time.  Findings:   - NO LA or LAA mass/thrombus.  - Prominent LA ridge posterior to LAA  - Normal EF  - Mild MR  - Pacer wires in place.   - Negative bubble study (no shunt).  Discussed with family.   Candee Furbish, MD

## 2016-09-29 NOTE — Interval H&P Note (Signed)
History and Physical Interval Note:  09/29/2016 8:09 AM  [name]  has presented today for surgery, with the diagnosis of afib  The various methods of treatment have been discussed with the patient and family. After consideration of risks, benefits and other options for treatment, the patient has consented to  Procedure(s): TRANSESOPHAGEAL ECHOCARDIOGRAM (TEE) (N/A) as a surgical intervention .  The patient's history has been reviewed, patient examined, no change in status, stable for surgery.  I have reviewed the patient's chart and labs.  Questions were answered to the patient's satisfaction.     [employer]

## 2016-09-29 NOTE — Anesthesia Postprocedure Evaluation (Addendum)
Anesthesia Post Note  Patient: [name]  Procedure(s) Performed: Procedure(s) (LRB): Atrial Fibrillation Ablation (N/A)  Patient location during evaluation: PACU Anesthesia Type: General Level of consciousness: awake, awake and alert and oriented Pain management: pain level controlled Vital Signs Assessment: post-procedure vital signs reviewed and stable Respiratory status: spontaneous breathing, nonlabored ventilation and respiratory function stable Cardiovascular status: blood pressure returned to baseline Postop Assessment: no headache Anesthetic complications: no       Last Vitals:  Vitals:   09/29/16 1925 09/29/16 2000  BP: (!) 95/56   Pulse: (!) 130   Resp: 20 (!) 27  Temp:      Last Pain:  Vitals:   09/29/16 1924  TempSrc: Oral                 [name]

## 2016-09-29 NOTE — Anesthesia Procedure Notes (Addendum)
Procedure Name: Intubation Date/Time: 09/29/2016 12:03 PM Performed by: Willeen Cass P Pre-anesthesia Checklist: Patient identified, Emergency Drugs available, Suction available and Patient being monitored Patient Re-evaluated:Patient Re-evaluated prior to inductionOxygen Delivery Method: Circle system utilized Preoxygenation: Pre-oxygenation with 100% oxygen Intubation Type: IV induction and Rapid sequence Laryngoscope Size: 3 and McGraph Grade View: Grade I Tube type: Subglottic suction tube Tube size: 7.0 mm Number of attempts: 1 Airway Equipment and Method: Stylet and Video-laryngoscopy Placement Confirmation: ETT inserted through vocal cords under direct vision,  breath sounds checked- equal and bilateral and CO2 detector Secured at: 21 cm Tube secured with: Tape (paper tape and tegaderm d/t allergy) Dental Injury: Teeth and Oropharynx as per pre-operative assessment

## 2016-09-29 NOTE — Progress Notes (Signed)
[name] is a 71 y.o. female with a history of atrial fibrillation. She had a failed cardioversion in the past. She thus presents for ablation of atrial fibrillation. On exam, iRRR, no murmurs, lungs clear. Risks and benefits were discussed. Risks include but not limited to bleeding, tamponade, heart block, stroke, and damage to surrounding organs. She understands these risks and has agreed to the procedure.  Hamed Debella Curt Bears, MD 09/29/2016 8:18 AM

## 2016-09-29 NOTE — Progress Notes (Signed)
PHARMACIST - PHYSICIAN ORDER COMMUNICATION  CONCERNING: P&T Medication Policy on Herbal Medications  DESCRIPTION:  This patient's order for:  Lysine  has been noted.  This product(s) is classified as an "herbal" or natural product. Due to a lack of definitive safety studies or FDA approval, nonstandard manufacturing practices, plus the potential risk of unknown drug-drug interactions while on inpatient medications, the Pharmacy and Therapeutics Committee does not permit the use of "herbal" or natural products of this type within [facility].   ACTION TAKEN: The pharmacy department is unable to verify this order at this time and your patient has been informed of this safety policy. Please reevaluate patient's clinical condition at discharge and address if the herbal or natural product(s) should be resumed at that time.  Dia Sitter, PharmD, BCPS 09/29/2016 6:41 PM

## 2016-09-29 NOTE — H&P (View-Only) (Signed)
Electrophysiology Office Note   Date:  09/18/2016   ID:  [name], DOB 1946/01/11, MRN [id number]  PCP:  Lottie Dawson, MD  Primary Electrophysiologist: Gaye Alken, MD    Chief Complaint  Patient presents with  . Follow-up    PAF     History of Present Illness: [name] is a 71 y.o. female who presents today for electrophysiology evaluation.   She has a history of coronary artery disease status post stent to the RCA in 2004, CHF, atrial fibrillation with tachybradycardia syndrome status post pacemaker, CKD, hypertension, hyperlipidemia, inferior wall MI, sleep apnea on CPAP, stroke. She was doing well with her atrial fibrillation maintaining sinus rhythm on CPAP and dofetilide until this past summer where she developed an upper airway infection and required cardioversion. She subsequently developed atrial fibrillation 2 months ago. She is quite short of breath with her atrial fibrillation. She had a cardioversion 4 days ago,, but return to atrial fibrillation after 3 minutes. She has since started amiodarone. Plan for AF ablation 09/29/16.   Today, she denies symptoms of chest pain, shortness of breath, orthopnea, PND, lower extremity edema, claudication, dizziness, presyncope, syncope, bleeding, or neurologic sequela. The patient is tolerating medications without difficulties and is otherwise without complaint today.    Past Medical History:  Diagnosis Date  . Anticoagulant long-term use    pradaxa  . Anxiety   . Arthritis    low back & both knees, Spondylolisthesis    . CAD (coronary artery disease) 3559,7416   post PTCA with bare-metal stenting to mid RCA in December 2004     . CHF (congestive heart failure) ([place])   . Chronic atrial fibrillation ([place]) 06/2007   Tachybradycardia pacemaker  . Chronic kidney disease    10% function - ?R, other kidney is compensating    . Depression   . Diplopia 06/19/2008   Qualifier: Diagnosis of  By: [name] MD,  [name]   . Dysrhythmia    ATRIAL FIBRILATION  . Edema of lower extremity   . History of acute inferior wall MI   . History of CVA (cerebrovascular accident)   . Hyperlipidemia   . Hypertension   . Myocardial infarction 3845,3646  . OSA on CPAP    last test- 2010  . Pacemaker   . Pneumonia 2014   tx. ----  [facility]  . Shortness of breath   . Stroke ([place]) [id number]  . TIA (transient ischemic attack)   . Transfusion history   . Unspecified hemorrhoids without mention of complication 02/02/2121   Colonoscopy--Dr. Carlean Purl    Past Surgical History:  Procedure Laterality Date  . APPENDECTOMY    . back surgeries     x2-3 by Dr Trenton Gammon  . BACK SURGERY    . CHOLECYSTECTOMY  1994  . CORONARY ANGIOPLASTY WITH STENT PLACEMENT  1998  . CORONARY STENT PLACEMENT     C stent  . DOPPLER ECHOCARDIOGRAPHY  2009  . ELECTROPHYSIOLOGIC STUDY N/A 03/31/2016   Procedure: Cardioversion;  Surgeon: Evans Lance, MD;  Location: [facility];  Service: Cardiovascular;  Laterality: N/A;  . ELECTROPHYSIOLOGIC STUDY N/A 08/04/2016   Procedure: Cardioversion;  Surgeon: Evans Lance, MD;  Location: [place] [facility];  Service: Cardiovascular;  Laterality: N/A;  . LEFT AND RIGHT HEART CATHETERIZATION WITH CORONARY ANGIOGRAM N/A 10/06/2013   Procedure: LEFT AND RIGHT HEART CATHETERIZATION WITH CORONARY ANGIOGRAM;  Surgeon: Troy Sine, MD;  Location: [facility] C[facility];  Service:  Cardiovascular;  Laterality: N/A;  . permanent pacemaker  06/2007  . TOTAL ABDOMINAL HYSTERECTOMY  1984   2 surgeries endometriosis bso     Current Outpatient Prescriptions  Medication Sig Dispense Refill  . acetaminophen (TYLENOL) 500 MG tablet Take 500 mg by mouth every 12 (twelve) hours as needed. Knee Pain    . amiodarone (PACERONE) 200 MG tablet Take 1 tablet (200 mg total) by mouth 2 (two) times daily. Start 08/06/16 - stop Tikosyn today 60 tablet 4  . apixaban (ELIQUIS) 5 MG TABS tablet Take 1 tablet (5 mg total) by  mouth 2 (two) times daily. 60 tablet 6  . atorvastatin (LIPITOR) 40 MG tablet TAKE 1 TABLET BY MOUTH EVERY DAY 90 tablet 2  . Cyanocobalamin (B-12) 5000 MCG CAPS Take 5,000 mcg by mouth daily at 12 noon.     . diltiazem (CARDIZEM CD) 360 MG 24 hr capsule Take 1 capsule (360 mg total) by mouth daily. 30 capsule 11  . FLUoxetine (PROZAC) 20 MG capsule TAKE ONE CAPSULE BY MOUTH EVERY MORNING 90 capsule 1  . fluticasone (FLONASE) 50 MCG/ACT nasal spray Place 1 spray into both nostrils daily as needed for allergies or rhinitis.    . furosemide (LASIX) 40 MG tablet 2 tablets (80mg ) by mouth in the AM and 1 tablet (40mg ) by mouth in the PM 360 tablet 0  . KLOR-CON M20 20 MEQ tablet TAKE 1 TABLET BY MOUTH TWICE A DAY 180 tablet 3  . losartan (COZAAR) 50 MG tablet Take 1 tablet (50 mg total) by mouth daily. 30 tablet 11  . Lysine HCl 1000 MG TABS Take 1,000 mg by mouth at bedtime.     . metoprolol succinate (TOPROL-XL) 100 MG 24 hr tablet Take 1 tablet (100 mg total) by mouth 2 (two) times daily. 180 tablet 3  . Multiple Vitamin (MULTIVITAMIN WITH MINERALS) TABS tablet Take 1 tablet by mouth daily. Centrum    . nitroGLYCERIN (NITROSTAT) 0.4 MG SL tablet Place 1 tablet (0.4 mg total) under the tongue every 5 (five) minutes x 3 doses as needed for chest pain. 25 tablet 6  . Polyethyl Glycol-Propyl Glycol (SYSTANE OP) Place 1 drop into both eyes daily as needed (dry eyes).    [name] PRESCRIPTION MEDICATION Inhale into the lungs at bedtime. CPAP     No current facility-administered medications for this visit.     Allergies:   Patient has no known allergies.   Social History:  The patient  reports that she quit smoking about 34 years ago. Her smoking use included Cigarettes. She started smoking about 38 years ago. She has a 5.00 pack-year smoking history. She has never used smokeless tobacco. She reports that she drinks alcohol. She reports that she does not use drugs.   Family History:  The patient's family  history includes Arrhythmia in her mother; Diabetes in her mother and paternal grandfather; Heart attack (age of onset: 66) in her brother; Heart disease in her paternal aunt; Hypertension in her mother; Prostate cancer in her maternal grandfather; Suicidality in her father.    ROS:  Please see the history of present illness.   Otherwise, review of systems is positive for palpitations, back pain, balance problems, dizziness.   All other systems are reviewed and negative.    PHYSICAL EXAM: VS:  BP 128/82   Pulse 72   Ht 5' 6.5" (1.689 m)   Wt 265 lb 6.4 oz (120.4 kg)   BMI 42.19 kg/m  , BMI Body mass index  is 42.19 kg/m. GEN: Well nourished, well developed, in no acute distress  HEENT: normal  Neck: no JVD, carotid bruits, or masses Cardiac: iRRR; no murmurs, rubs, or gallops,no edema  Respiratory:  clear to auscultation bilaterally, normal work of breathing GI: soft, nontender, nondistended, + BS MS: no deformity or atrophy  Skin: warm and dry,  device pocket is well healed Neuro:  Strength and sensation are intact Psych: euthymic mood, full affect  EKG:  EKG is ordered today. Personal review of the ekg ordered 08/08/16 shows atrial fibrillation, rate 96   Device interrogation is reviewed today in detail.  See PaceArt for details.   Recent Labs: 03/26/2016: B Natriuretic Peptide 553.9 04/01/2016: ALT 53; Hemoglobin 13.7 07/24/2016: TSH 3.56 07/28/2016: Magnesium 2.3 08/02/2016: BUN 26; Creatinine, Ser 0.94; Platelets 327; Potassium 4.9; Sodium 142    Lipid Panel     Component Value Date/Time   CHOL 100 03/31/2016 0248   TRIG 143 03/31/2016 0248   HDL 22 (L) 03/31/2016 0248   CHOLHDL 4.5 03/31/2016 0248   VLDL 29 03/31/2016 0248   LDLCALC 49 03/31/2016 0248     Wt Readings from Last 3 Encounters:  09/18/16 265 lb 6.4 oz (120.4 kg)  08/08/16 256 lb 6.4 oz (116.3 kg)  08/04/16 260 lb (117.9 kg)      Other studies Reviewed: Additional studies/ records that were  reviewed today include: TTE 03/28/16  Review of the above records today demonstrates:  - Left ventricle: The cavity size was normal. Wall thickness was   increased in a pattern of mild LVH. Systolic function was normal.   The estimated ejection fraction was in the range of 55% to 60%.   Indeterminant diastolic function (atrial fibrillation). Wall   motion was normal; there were no regional wall motion   abnormalities. - Aortic valve: There was no stenosis. - Mitral valve: Mildly calcified annulus. There was no significant   regurgitation. - Left atrium: The atrium was mildly dilated. - Right ventricle: The cavity size was normal. Pacer wire or   catheter noted in right ventricle. Systolic function was normal. - Tricuspid valve: Peak RV-RA gradient (S): 22 mm Hg. - Pulmonary arteries: PA peak pressure: 30 mm Hg (S). - Systemic veins: IVC measured 2.3 cm with normal respirophasic   variation, suggesting RA pressure 8 mmHg.   ASSESSMENT AND PLAN:  1.  Paroxysmal atrial fibrillation: Has controlled ventricular rates, but does have symptoms of shortness of breath and fatigue. Had unsuccessful cardioversion 08/04/16. AF ablation scheduled 09/29/16.  Risks and benefits of the procedure were discussed. Risks include bleeding, tamponade, heart block, stroke, and damage to surrounding organs, among others. She understands these risks and has agreed to ablation.  2. Hypertension: Well-controlled on current medications  3. Obesity: Weight loss encouraged  4. Chronic diastolic heart failure: Currently well compensated.  5. Obstructive sleep apnea: on CPAP    Current medicines are reviewed at length with the patient today.   The patient does not have concerns regarding her medicines.  The following changes were made today:  none  Labs/ tests ordered today include: CBC, BMP No orders of the defined types were placed in this encounter.    Disposition:   FU with Raji Glinski 3  months  Signed, Camryn Quesinberry Meredith Leeds, MD  09/18/2016 11:40 AM     [facility] [address] [place] [address] [phone] (office) [phone] (fax)

## 2016-09-29 NOTE — Anesthesia Preprocedure Evaluation (Addendum)
Anesthesia Evaluation  Patient identified by MRN, date of birth, ID band Patient awake    Reviewed: Allergy & Precautions, NPO status , Patient's Chart, lab work & pertinent test results  Airway Mallampati: II  TM Distance: >3 FB Neck ROM: Full    Dental  (+) Teeth Intact, Dental Advisory Given   Pulmonary former smoker,    breath sounds clear to auscultation       Cardiovascular hypertension,  Rhythm:Irregular Rate:Normal     Neuro/Psych    GI/Hepatic   Endo/Other    Renal/GU      Musculoskeletal   Abdominal (+) + obese,   Peds  Hematology   Anesthesia Other Findings   Reproductive/Obstetrics                           Anesthesia Physical Anesthesia Plan  ASA: III  Anesthesia Plan: General   Post-op Pain Management:    Induction: Intravenous  Airway Management Planned: Oral ETT  Additional Equipment:   Intra-op Plan:   Post-operative Plan: Extubation in OR  Informed Consent: I have reviewed the patients History and Physical, chart, labs and discussed the procedure including the risks, benefits and alternatives for the proposed anesthesia with the patient or authorized representative who has indicated his/her understanding and acceptance.   Dental advisory given  Plan Discussed with: CRNA and Anesthesiologist  Anesthesia Plan Comments:         Anesthesia Quick Evaluation

## 2016-09-29 NOTE — Progress Notes (Signed)
Site area: Left groin a 7 and 9 french venous sheaths were removed  Site Prior to Removal:  Level 0  Pressure Applied For 20 MINUTES    Bedrest Beginning at 1800p  Manual:   Yes.    Patient Status During Pull:  stable  Post Pull Groin Site:  Level 0  Post Pull Instructions Given:  Yes.    Post Pull Pulses Present:  Yes.    Dressing Applied:  Yes.  Pressure dressing applied.  Comments:  VS remain stable

## 2016-09-30 DIAGNOSIS — I484 Atypical atrial flutter: Secondary | ICD-10-CM | POA: Diagnosis present

## 2016-09-30 DIAGNOSIS — N189 Chronic kidney disease, unspecified: Secondary | ICD-10-CM | POA: Diagnosis not present

## 2016-09-30 DIAGNOSIS — Z6841 Body Mass Index (BMI) 40.0 and over, adult: Secondary | ICD-10-CM | POA: Diagnosis not present

## 2016-09-30 DIAGNOSIS — I97638 Postprocedural hematoma of a circulatory system organ or structure following other circulatory system procedure: Secondary | ICD-10-CM | POA: Diagnosis not present

## 2016-09-30 DIAGNOSIS — I5032 Chronic diastolic (congestive) heart failure: Secondary | ICD-10-CM | POA: Diagnosis not present

## 2016-09-30 DIAGNOSIS — I2510 Atherosclerotic heart disease of native coronary artery without angina pectoris: Secondary | ICD-10-CM | POA: Diagnosis not present

## 2016-09-30 DIAGNOSIS — I481 Persistent atrial fibrillation: Secondary | ICD-10-CM | POA: Diagnosis not present

## 2016-09-30 DIAGNOSIS — I9589 Other hypotension: Secondary | ICD-10-CM | POA: Diagnosis not present

## 2016-09-30 DIAGNOSIS — T148XXA Other injury of unspecified body region, initial encounter: Secondary | ICD-10-CM | POA: Insufficient documentation

## 2016-09-30 DIAGNOSIS — I130 Hypertensive heart and chronic kidney disease with heart failure and stage 1 through stage 4 chronic kidney disease, or unspecified chronic kidney disease: Secondary | ICD-10-CM | POA: Diagnosis not present

## 2016-09-30 DIAGNOSIS — I959 Hypotension, unspecified: Secondary | ICD-10-CM

## 2016-09-30 DIAGNOSIS — I252 Old myocardial infarction: Secondary | ICD-10-CM | POA: Diagnosis not present

## 2016-09-30 DIAGNOSIS — I495 Sick sinus syndrome: Secondary | ICD-10-CM | POA: Diagnosis not present

## 2016-09-30 LAB — CBC
HCT: 36.1 % (ref 36.0–46.0)
Hemoglobin: 11.3 g/dL — ABNORMAL LOW (ref 12.0–15.0)
MCH: 30.7 pg (ref 26.0–34.0)
MCHC: 31.3 g/dL (ref 30.0–36.0)
MCV: 98.1 fL (ref 78.0–100.0)
Platelets: 237 10*3/uL (ref 150–400)
RBC: 3.68 MIL/uL — ABNORMAL LOW (ref 3.87–5.11)
RDW: 16.7 % — ABNORMAL HIGH (ref 11.5–15.5)
WBC: 9.1 10*3/uL (ref 4.0–10.5)

## 2016-09-30 LAB — HEMOGLOBIN AND HEMATOCRIT, BLOOD
HCT: 35.7 % — ABNORMAL LOW (ref 36.0–46.0)
Hemoglobin: 11.5 g/dL — ABNORMAL LOW (ref 12.0–15.0)

## 2016-09-30 MED ORDER — APIXABAN 5 MG PO TABS: 5.0000 mg | ORAL_TABLET | Freq: Two times a day (BID) | ORAL | Status: DC

## 2016-09-30 MED ORDER — METOPROLOL SUCCINATE ER 50 MG PO TB24
50.0000 mg | ORAL_TABLET | Freq: Two times a day (BID) | ORAL | Status: DC
  Administered 2016-09-30 – 2016-10-01 (×3): 50 mg via ORAL
  Filled 2016-09-30 (×3): qty 1

## 2016-09-30 NOTE — Progress Notes (Signed)
Dr. Caryl Comes into see patient and stated he felt a hematoma. 910 assessed left groin site and noted hematoma deep inferior lateral to groin site, 3 mm x 2 mm and anterior to site 47mm x 1 mm. Pressure held for 30 minutes and hematoma expressed. Bruising noted at that time with no hematoma. Patients site monitored with no change.  [place] PA about result of CBC, Hgb 11.3. Luke into assess patient and groin site. Remains soft. Medications reviewed and changes made. Held Eliquis as ordered and antihypertensives. Will give Toprol 50 mg when available.  Patient up to bathroom and sitting in chair to wash. No change in groin site assessment after activity.  Report given to Magdalene River RN via phone. Transferred to [address] in bed at 1205. Assessment with Magdalene River RN of groin site completed on arrival to [address] with no change noted, Bruising only, area soft with no hematoma.

## 2016-09-30 NOTE — Progress Notes (Addendum)
Patient Name: [name]      SUBJECTIVE: low BP last night   Up today without dizziness  No groin pain  Past Medical History:  Diagnosis Date  . Anticoagulant long-term use    pradaxa  . Anxiety   . Arthritis    low back & both knees, Spondylolisthesis    . CAD (coronary artery disease) 4665,9935   post PTCA with bare-metal stenting to mid RCA in December 2004     . CHF (congestive heart failure) ([place])   . Chronic atrial fibrillation ([place]) 06/2007   Tachybradycardia pacemaker  . Chronic kidney disease    10% function - ?R, other kidney is compensating    . Depression   . Diplopia 06/19/2008   Qualifier: Diagnosis of  By: [name] MD, [name]   . Dysrhythmia    ATRIAL FIBRILATION  . Edema of lower extremity   . History of acute inferior wall MI   . History of CVA (cerebrovascular accident)   . Hyperlipidemia   . Hypertension   . Myocardial infarction [id number]  . OSA on CPAP    last test- 2010  . Pacemaker   . Pneumonia 2014   tx. ----  [facility]  . Shortness of breath   . Stroke ([place]) [id number]  . TIA (transient ischemic attack)   . Transfusion history   . Unspecified hemorrhoids without mention of complication 0/30/0923   Colonoscopy--Dr. Carlean Purl     Scheduled Meds:  Scheduled Meds: . acetaminophen  1,000 mg Oral BID  . amiodarone  200 mg Oral BID  . apixaban  5 mg Oral BID  . atorvastatin  40 mg Oral Daily  . calcium-vitamin D  1 tablet Oral Q breakfast  . diltiazem  360 mg Oral Daily  . FLUoxetine  20 mg Oral q morning - 10a  . losartan  50 mg Oral Daily  . metoprolol succinate  100 mg Oral BID  . multivitamin with minerals  1 tablet Oral Daily  . potassium chloride SA  20 mEq Oral BID  . sodium chloride flush  3 mL Intravenous Q12H  . vitamin B-12  5,000 mcg Oral QHS  . vitamin C  1,000 mg Oral Daily  . vitamin E  1,000 Units Oral Daily   Continuous Infusions: sodium chloride, acetaminophen, fluticasone, nitroGLYCERIN,  ondansetron (ZOFRAN) IV, polyvinyl alcohol, sodium chloride flush    PHYSICAL EXAM Vitals:   09/29/16 2000 09/30/16 0500 09/30/16 0651 09/30/16 0700  BP:  (!) 77/46 110/70 (!) 91/51  Pulse:  94  93  Resp: (!) 27 (!) 22 (!) 22 (!) 21  Temp:  98 F (36.7 C)  98.7 F (37.1 C)  TempSrc:  Axillary  Oral  SpO2:  91% 91% 92%  Weight:  277 lb 5.4 oz (125.8 kg)    Height:       Well developed and Morbidly obese ino acute distress HENT normal Neck supple with JVP-flat Carotids brisk and full without bruits Clear Regular rate and rhythm, no murmurs or gallops Abd-soft with active BS without hepatomegaly No Clubbing cyanosis edema Large hematoma L thigh  Skin-warm and dry A & Oriented  Grossly normal sensory and motor function  TELEMETRY: Reviewed personnally pt in atrial flutter with variable conduction :=and intermittent V pacing    ECG Personally reviewed  Similar to telemetry  Intake/Output Summary (Last 24 hours) at 09/30/16 0927 Last data filed at 09/30/16 0700  Gross per 24 hour  Intake  1940 ml  Output             1470 ml  Net              470 ml    LABS: Basic Metabolic Panel: No results for input(s): NA, K, CL, CO2, GLUCOSE, BUN, CREATININE, CALCIUM, MG, PHOS in the last 168 hours. Cardiac Enzymes: No results for input(s): CKTOTAL, CKMB, CKMBINDEX, TROPONINI in the last 72 hours. CBC: No results for input(s): WBC, NEUTROABS, HGB, HCT, MCV, PLT in the last 168 hours. PROTIME: No results for input(s): LABPROT, INR in the last 72 hours. Liver Function Tests: No results for input(s): AST, ALT, ALKPHOS, BILITOT, PROT, ALBUMIN in the last 72 hours. No results for input(s): LIPASE, AMYLASE in the last 72 hours. BNP: BNP (last 3 results)  Recent Labs  03/26/16 1630  BNP 553.9*        ASSESSMENT AND PLAN:  Active Problems:   AF (atrial fibrillation) (HCC)   Atypical atrial flutter (HCC)   Hematoma   Hypotension  The pt had significant  hypotension last pm 70's with her normal about 140 although yday pre procedure only 115 or so  Significant hematoma L leg, with firmness and discoloration   Will check HgB prior to making a decision re going home  Will also reapply pressure and continue bed rest x 6 hrs  Pt is aware that she may need to stay  Vasc surgery consult not yet called--will see   Will hold dilt dfor now  Signed, Virl Axe MD  09/30/2016

## 2016-09-30 NOTE — Progress Notes (Signed)
Hgb 11.3 - it had been 14.5 on 09/18/16. Will hold Eliquis today, check H/H later today and in AM. Also holding Diltiazem and Cozaar with low B/P- continue Toprol at decreased dose (she was on Toprol 100 mg BID). Possible discharge tomorrow if Hgb stable.   Kerin Ransom PA-C 09/30/2016 11:06 AM

## 2016-10-01 ENCOUNTER — Encounter (HOSPITAL_COMMUNITY): Payer: Self-pay | Admitting: Cardiology

## 2016-10-01 DIAGNOSIS — I480 Paroxysmal atrial fibrillation: Secondary | ICD-10-CM

## 2016-10-01 DIAGNOSIS — I481 Persistent atrial fibrillation: Secondary | ICD-10-CM | POA: Diagnosis not present

## 2016-10-01 LAB — CBC
HCT: 37.5 % (ref 36.0–46.0)
Hemoglobin: 11.9 g/dL — ABNORMAL LOW (ref 12.0–15.0)
MCH: 31.2 pg (ref 26.0–34.0)
MCHC: 31.7 g/dL (ref 30.0–36.0)
MCV: 98.4 fL (ref 78.0–100.0)
Platelets: 227 10*3/uL (ref 150–400)
RBC: 3.81 MIL/uL — ABNORMAL LOW (ref 3.87–5.11)
RDW: 17 % — ABNORMAL HIGH (ref 11.5–15.5)
WBC: 11.5 10*3/uL — ABNORMAL HIGH (ref 4.0–10.5)

## 2016-10-01 MED ORDER — METOPROLOL SUCCINATE ER 50 MG PO TB24: 50.0000 mg | ORAL_TABLET | Freq: Two times a day (BID) | ORAL | 11 refills | Status: DC

## 2016-10-01 MED ORDER — DILTIAZEM HCL ER COATED BEADS 120 MG PO CP24
120.0000 mg | ORAL_CAPSULE | Freq: Every day | ORAL | Status: DC
  Administered 2016-10-01: 120 mg via ORAL
  Filled 2016-10-01: qty 1

## 2016-10-01 MED ORDER — DILTIAZEM HCL ER COATED BEADS 120 MG PO CP24: 120.0000 mg | ORAL_CAPSULE | Freq: Every day | ORAL | 11 refills | Status: DC

## 2016-10-01 MED ORDER — ACETAMINOPHEN 325 MG PO TABS: 650.0000 mg | ORAL_TABLET | ORAL | Status: DC | PRN

## 2016-10-01 NOTE — Discharge Summary (Signed)
Discharge Summary    Patient ID: [name],  MRN: [id number], DOB/AGE: December 05, 1945 71 y.o.  Admit date: 09/29/2016 Discharge date: 10/01/2016  Primary Care Provider: Lottie Dawson Primary Cardiologist: Dr Nahser/ Dr Curt Bears  Discharge Diagnoses    Principal Problem:   Atypical atrial flutter [facility]) Active Problems:   CAD S/P percutaneous coronary angioplasty   Paroxysmal atrial fibrillation (HCC)   Anticoagulant long-term use   Hypotension   Obesity   Obstructive sleep apnea   PACEMAKER, PERMANENT   Allergies Allergies  Allergen Reactions  . Adhesive [Tape]     Allergic to EKG stickers and defibrillation pads.    Diagnostic Studies/Procedures    Atrial fibrillation ablation-09/29/16 TEE- 09/29/16 _____________   History of Present Illness     71 y/o female admitted for AF ablation.  Hospital Course      Pleasant 71 y.o.morbidly obese female with a history of CAD status post inferior wall MI in 2004 - stent to the RCA, diastolic CHF, atrial fibrillation with tachybradycardia syndrome status post pacemaker, chronic anticoagulation with Eliquis, HTN, HLD, sleep apnea on CPAP, and a history of prior stroke 2005-2008, admitted by Dr Curt Bears 09/29/16  for AF RFA by Dr Rayann Heman. See Dr Jackalyn Lombard OP note. A TEE was also done for a question of a Lt atrial appendage thrombus, this was not seen on TEE.    The procedure was successful but later that day she went back into AF with RVR. That night she had hypotension and ecchymosis of her LFA site. There was concern by Dr Caryl Comes who saw her the morning of the 31st that she may have a significant hematoma. Her Eliquis was held. Stat CBC showed her Hgb to be 11- it was 14.5 two weeks prior. In addition to holdeing her Eliquis we stopped her Cozaar, cut back her Torpol to 50 mg BID and held her Diltiazem secondary to low B/P. A f/u H/H late on the 31st remained stable. On the morning of 10/01/16 her Hgb is 11.9. Her B/P is stable 591  systolic, HR 638. We have resumed her Diltiazem at a decreased dose-120 mg. She has ambulated and Dr Johnsie Cancel feels she can be discharged. Her diuretics have been held this admission secondary to hypotension and may need to be resumed as an OP. She has a f/u with Dr Acie Fredrickson 4/16 at 8:20, and a f/u in the AF clinic 4/23.   _____________  Discharge Vitals Blood pressure 133/74, pulse 84, temperature 98.5 F (36.9 C), temperature source Oral, resp. rate 20, height 5' 6.5" (1.689 m), weight 273 lb 8 oz (124.1 kg), SpO2 95 %.  Filed Weights   09/29/16 0805 09/30/16 0500 10/01/16 0422  Weight: 265 lb (120.2 kg) 277 lb 5.4 oz (125.8 kg) 273 lb 8 oz (124.1 kg)    Labs & Radiologic Studies    CBC  Recent Labs  09/30/16 0922 09/30/16 1616 10/01/16 0437  WBC 9.1  --  11.5*  HGB 11.3* 11.5* 11.9*  HCT 36.1 35.7* 37.5  MCV 98.1  --  98.4  PLT 237  --  227   _____________    Addendum Date: 09/26/2016   ADDENDUM REPORT: 09/26/2016 00:14 CLINICAL DATA:  Chest pain EXAM: Cardiac CTA MEDICATIONS: Sub lingual nitro. 4mg  and lopressor 20mg  IV total TECHNIQUE: The patient was scanned on a Philips 466 slice scanner. Gantry rotation speed was 270 msecs. Collimation was .76mm. A 100 kV prospective scan was triggered in the descending thoracic aorta at 111 HU's  with 5% padding centered around 78% of the R-R interval. Average HR during the scan was 70 bpm. The 3D data set was interpreted on a dedicated work station using MPR, MIP and VRT modes. A total of 80cc of contrast was used. FINDINGS: Non-cardiac: See separate report from [facility]. Coronary Arteries: Right dominant with no anomalies LM:  No plaque or stenosis noted. LAD system: Calcified plaque with probably mild stenosis in the proximal LAD just before origin of D1. Moderate D1 with no significant disease. Circumflex system:  Mixed plaque in the proximal LCx, mild stenosis. RCA: Long stented segment in the proximal to mid RCA. Cannot totally rule  out significant in-stent restenosis but stented segment appears to be patent (with some shadowing artifact from the stent struts). No significant disease in the distal RCA, PCA, PLV. Pacemaker noted in right ventricle. LA appendage: There is a large filling defect at the tip of the left atrial appendage concerning for LA thrombus. Pulmonary veins:  Drain normally to left atrium. LUPV 19 x 17 mm LLPV 19 x 18.5 mm RUPV 21 x 19 mm RLPV 20 x 14.5 mm IMPRESSION: 1.  Pulmonary veins as described above.  No anomalies noted. 2.  Suspect left atrial appendage thrombus.  Suggest TEE to confirm. 3. Suspect mild stenoses in the proximal LAD and proximal LCx. Long stented segment in the proximal to mid RCA, suspect patent (cannot totally rule out significant in-stent restenosis but suspect shadowing artifact is present). Dalton Mclean Electronically Signed   By: Loralie Champagne M.D.   On: 09/26/2016 00:14   Result Date: 09/26/2016 EXAM: OVER-READ INTERPRETATION  CT CHEST The following report is an over-read performed by radiologist Dr. Rebekah Chesterfield [facility] on 09/25/2016. This over-read does not include interpretation of cardiac or coronary anatomy or pathology. The coronary CTA interpretation by the cardiologist is attached. COMPARISON:  None. FINDINGS: Large filling defect in the tip of the left atrial appendage, concerning for potential left atrial appendage thrombus. There multiple enlarged mediastinal and hilar lymph nodes bilaterally, measuring up to 13 mm in short axis in the low right paratracheal nodal station. Within the visualized portions of the thorax there are no definite suspicious appearing pulmonary nodules or masses, there is no acute consolidative airspace disease, no pleural effusions and no pneumothorax. Visualized portions of the upper abdomen demonstrate diffuse low attenuation throughout the visualized hepatic parenchyma, indicative of hepatic steatosis. Liver contour is irregular,  suggesting underlying cirrhosis. The benign-appearing calcification in the spleen incidentally noted. There are no aggressive appearing lytic or blastic lesions noted in the visualized portions of the skeleton. IMPRESSION: 1. Large filling defect in the tip of the left atrial appendage, highly concerning for left atrial appendage thrombus. Correlation with transesophageal echocardiography is recommended if clinically appropriate. 2. Extensive mediastinal and hilar lymphadenopathy. This can be seen in systemic diseases such as sarcoidosis or lymphoproliferative disorder. Clinical correlation is recommended. 3. Hepatic steatosis. 4. Morphologic changes in the liver suggestive of early cirrhosis, as above. Electronically Signed: By: Vinnie Langton M.D. On: 09/25/2016 11:26    TEE 09/29/16- Study Conclusions  - Left ventricle: The cavity size was normal. Wall thickness was   normal. Systolic function was normal. The estimated ejection   fraction was in the range of 55% to 60%. - Aortic valve: No evidence of vegetation. - Mitral valve: No evidence of vegetation. There was mild   regurgitation. - Left atrium: No evidence of thrombus in the atrial cavity or   appendage. No  evidence of thrombus in the appendage. no mass. - Right atrium: No evidence of thrombus in the atrial cavity or   appendage. - Atrial septum: No defect or patent foramen ovale was identified.   Echo contrast study showed no right-to-left atrial level shunt,   following an increase in RA pressure induced by provocative   maneuvers. - Tricuspid valve: No evidence of vegetation. - Line: A venous pacing wire was visualized in the right atrial   cavity and right ventricular cavity, with its tip at the RV apex. - Superior vena cava: The study excluded a thrombus.   Disposition   Pt is being discharged home today in good condition.  Follow-up Plans & Appointments    Follow-up Information    [facility] Follow up on 10/23/2016.   Specialty:  Cardiology Why:  9:30AM Contact information: [address] [id number] [place] [phone]       Will Meredith Leeds, MD Follow up on 12/12/2016.   Specialty:  Cardiology Why:  9:45AM Contact information: [address] [place] [address] [phone]        Mertie Moores, MD Follow up on 10/16/2016.   Specialty:  Cardiology Why:  8:20AM Contact information: [place] [address] [place] [address] [phone]            Discharge Medications   Current Discharge Medication List    CONTINUE these medications which have CHANGED   Details  acetaminophen (TYLENOL) 325 MG tablet Take 2 tablets (650 mg total) by mouth every 4 (four) hours as needed for headache or mild pain.    diltiazem (CARDIZEM CD) 120 MG 24 hr capsule Take 1 capsule (120 mg total) by mouth daily. Qty: 30 capsule, Refills: 11    metoprolol succinate (TOPROL-XL) 50 MG 24 hr tablet Take 1 tablet (50 mg total) by mouth 2 (two) times daily. Take with or immediately following a meal. Qty: 60 tablet, Refills: 11      CONTINUE these medications which have NOT CHANGED   Details  amiodarone (PACERONE) 200 MG tablet Take 1 tablet (200 mg total) by mouth 2 (two) times daily. Start 08/06/16 - stop Tikosyn today Qty: 60 tablet, Refills: 4    apixaban (ELIQUIS) 5 MG TABS tablet Take 1 tablet (5 mg total) by mouth 2 (two) times daily. Qty: 60 tablet, Refills: 6    Ascorbic Acid (VITAMIN C) 1000 MG tablet Take 1,000 mg by mouth daily.    atorvastatin (LIPITOR) 40 MG tablet TAKE 1 TABLET BY MOUTH EVERY DAY Qty: 90 tablet, Refills: 2    Calcium Carbonate-Vitamin D (CALCIUM 600+D PO) Take 1 tablet by mouth daily.    Cyanocobalamin (B-12) 5000 MCG CAPS Take 5,000 mcg by mouth at bedtime.     FLUoxetine (PROZAC) 20 MG capsule TAKE ONE CAPSULE BY MOUTH EVERY MORNING Qty: 90 capsule, Refills: 1    Lysine  HCl 1000 MG TABS Take 1,000 mg by mouth daily.     Multiple Vitamin (MULTIVITAMIN WITH MINERALS) TABS tablet Take 1 tablet by mouth daily. Centrum    nitroGLYCERIN (NITROSTAT) 0.4 MG SL tablet Place 1 tablet (0.4 mg total) under the tongue every 5 (five) minutes x 3 doses as needed for chest pain. Qty: 25 tablet, Refills: 6    Polyethyl Glycol-Propyl Glycol (SYSTANE OP) Place 1 drop into both eyes daily as needed (dry eyes).    PRESCRIPTION MEDICATION Inhale into the lungs at bedtime. CPAP  vitamin E 1000 UNIT capsule Take 1,000 Units by mouth daily.    fluticasone (FLONASE) 50 MCG/ACT nasal spray Place 1 spray into both nostrils daily as needed for allergies or rhinitis.      STOP taking these medications     furosemide (LASIX) 40 MG tablet      KLOR-CON M20 20 MEQ tablet      losartan (COZAAR) 50 MG tablet           Outstanding Labs/Studies    Duration of Discharge Encounter   Greater than 30 minutes including physician time.  Angelena Form PA 10/01/2016, 10:14 AM

## 2016-10-01 NOTE — Progress Notes (Signed)
Progress Note  Patient Name: [name] Date of Encounter: 10/01/2016  Primary Cardiologist: Dr Nahser/ Dr Curt Bears  Subjective   No complaints overnight Groin not tender   Inpatient Medications    Scheduled Meds: . acetaminophen  1,000 mg Oral BID  . amiodarone  200 mg Oral BID  . apixaban  5 mg Oral BID  . atorvastatin  40 mg Oral Daily  . calcium-vitamin D  1 tablet Oral Q breakfast  . diltiazem  120 mg Oral Daily  . FLUoxetine  20 mg Oral q morning - 10a  . metoprolol succinate  50 mg Oral BID  . multivitamin with minerals  1 tablet Oral Daily  . potassium chloride SA  20 mEq Oral BID  . sodium chloride flush  3 mL Intravenous Q12H  . vitamin B-12  5,000 mcg Oral QHS  . vitamin C  1,000 mg Oral Daily  . vitamin E  1,000 Units Oral Daily   Continuous Infusions:  PRN Meds: sodium chloride, acetaminophen, fluticasone, nitroGLYCERIN, ondansetron (ZOFRAN) IV, polyvinyl alcohol, sodium chloride flush   Vital Signs    Vitals:   09/30/16 1414 09/30/16 1700 09/30/16 1900 10/01/16 0422  BP: (!) 86/43 116/66 (!) 127/50 115/63  Pulse: (!) 113  87 84  Resp:   20   Temp: 99.5 F (37.5 C)  98.6 F (37 C) 98.5 F (36.9 C)  TempSrc: Oral  Oral Oral  SpO2: (!) 89%  93% 92%  Weight:    273 lb 8 oz (124.1 kg)  Height:        Intake/Output Summary (Last 24 hours) at 10/01/16 0758 Last data filed at 10/01/16 0430  Gross per 24 hour  Intake              840 ml  Output             2651 ml  Net            -1811 ml   Filed Weights   09/29/16 0805 09/30/16 0500 10/01/16 0422  Weight: 265 lb (120.2 kg) 277 lb 5.4 oz (125.8 kg) 273 lb 8 oz (124.1 kg)    Telemetry    AF with VR 100-110 - Personally Reviewed  ECG    09/30/16-Paced- Personally Reviewed  Physical Exam   GEN: No acute distress.   Neck: No JVD Cardiac: irreg irreg, no murmurs, rubs, or gallops.  Respiratory: Clear to auscultation bilaterally. GI: Obese,  MS: No edema; No deformity. LFA ecchymosis,  soft Neuro:  Nonfocal  Psych: Normal affect   Labs    ChemistryNo results for input(s): NA, K, CL, CO2, GLUCOSE, BUN, CREATININE, CALCIUM, PROT, ALBUMIN, AST, ALT, ALKPHOS, BILITOT, GFRNONAA, GFRAA, ANIONGAP in the last 168 hours.   Hematology Recent Labs Lab 09/30/16 0922 09/30/16 1616 10/01/16 0437  WBC 9.1  --  11.5*  RBC 3.68*  --  3.81*  HGB 11.3* 11.5* 11.9*  HCT 36.1 35.7* 37.5  MCV 98.1  --  98.4  MCH 30.7  --  31.2  MCHC 31.3  --  31.7  RDW 16.7*  --  17.0*  PLT 237  --  227     Radiology    No results found.  Cardiac Studies   TEE 09/29/16- Study Conclusions  - Left ventricle: The cavity size was normal. Wall thickness was   normal. Systolic function was normal. The estimated ejection   fraction was in the range of 55% to 60%. - Aortic valve: No evidence of  vegetation. - Mitral valve: No evidence of vegetation. There was mild   regurgitation. - Left atrium: No evidence of thrombus in the atrial cavity or   appendage. No evidence of thrombus in the appendage. no mass. - Right atrium: No evidence of thrombus in the atrial cavity or   appendage. - Atrial septum: No defect or patent foramen ovale was identified.   Echo contrast study showed no right-to-left atrial level shunt,   following an increase in RA pressure induced by provocative   maneuvers. - Tricuspid valve: No evidence of vegetation. - Line: A venous pacing wire was visualized in the right atrial   cavity and right ventricular cavity, with its tip at the RV apex. - Superior vena cava: The study excluded a thrombus.  AF ablation 09/29/16- CONCLUSIONS: 1. Atrial fibrillation upon presentation.  2. Intracardiac echo reveals a moderate to large sized left atrium. 3. Successful electrical isolation and anatomical encircling of all four pulmonary veins with radiofrequency current. 4. Additional mapping and ablation within the left atrium due to persistence of atrial fibrillation with a posterior  wall box demonstrated  5. Atrial fibrillation successfully cardioverted to sinus rhythm. 6. Cavo-tricuspid isthmus ablation performed with complete bidirectional isthmus block achieved 7. No inducible arrhythmias following ablation 8. No early apparent complications.  Thompson Grayer MD, [facility] 09/29/2016 3:35 PM     Patient Profile     71 y.o. female with a history of CAD status post inferior wall MI in 2004 - stent to the RCA , CHF, atrial fibrillation with tachybradycardia syndrome status post pacemaker, CKD, HTN, HLD, sleep apnea on CPAP, stroke, adm for AF RFA.  Assessment & Plan    1.  PAF/SSS/PTVDP:  Pt had RFA but went back into AF with increased VR. She has had issues with low B/P and Cozaar and Diltiazem were held, Toprol 100 mg BID decreased to 50 mg BID. Lt groin ecchymosis stable- no significant further drop in Hgb.   2. Hypertension: hypotensive post RFA  3. Obesity: BMI 42-Weight loss encouraged  4. Chronic diastolic heart failure: Currently well compensated.  5. Obstructive sleep apnea: on CPAP  6. Chronic anticoagulation- Eliquis held yesterday secondary to concern for significant Lt groin bleed s/p RFA- will resume today.   Plan: I resumed Diltiazem at a lower dose-120 mg.  Ambulate after medications this am- if B/P and HR stable DC later today with f/u as arranged in AF clinic 4/23.  Note: Home medications for rate control- Toprol 100 mg BID Amiodarone 200 mg BID (failed Tikosyn) Diltiazem 360 mg daily   Signed, Kerin Ransom, PA-C  10/01/2016, 7:58 AM    Patient examined chart reviewed. Left groin hematoma soft and stable AFib rate ok on telemetry AGree with med changes Above d/c home with outpatient f/u Dr Lovena Le and Nahser   Jenkins Rouge

## 2016-10-02 ENCOUNTER — Encounter: Payer: Self-pay | Admitting: Internal Medicine

## 2016-10-02 ENCOUNTER — Telehealth: Payer: Self-pay | Admitting: Internal Medicine

## 2016-10-02 ENCOUNTER — Encounter (HOSPITAL_COMMUNITY): Payer: Self-pay | Admitting: Cardiology

## 2016-10-02 MED ORDER — POTASSIUM CHLORIDE CRYS ER 20 MEQ PO TBCR: 20.0000 meq | EXTENDED_RELEASE_TABLET | Freq: Every day | ORAL | 3 refills | Status: DC

## 2016-10-02 MED ORDER — FUROSEMIDE 40 MG PO TABS: ORAL_TABLET | ORAL | 10 refills | Status: DC

## 2016-10-02 MED FILL — Heparin Sodium (Porcine) 2 Unit/ML in Sodium Chloride 0.9%: INTRAMUSCULAR | Qty: 2000 | Status: AC

## 2016-10-02 NOTE — Telephone Encounter (Signed)
Patient stated that she has gained 11 pounds since she stopped her lasix. Patient had an ablation on Friday and was discharged from the hospital at that time her lasix was discontinued. Patient complained of swelling in her BLE and hands. Patient stated she weighed 265 before she went to the hospital and now she weighs 276 pounds. Dr. Curt Bears did her ablation will consult for advisement.  Dr. Curt Bears advised for patient to start back on her lasix and potassium. Patient regular dose on lasix is 80 mg in the morning and 40 mg in the evening. Dr. Curt Bears advised patient to take 80 mg twice daily for a few days to see if that helps. Will add patient's medications back on her list. Patient verbalized understanding.

## 2016-10-02 NOTE — Telephone Encounter (Signed)
New message    Pt c/o swelling: STAT is pt has developed SOB within 24 hours  1. How long have you been experiencing swelling? Since Friday  2. Where is the swelling located? Ankles, legs, hands  3.  Are you currently taking a "fluid pill"? Pt states she had a procedure Friday and was instructed not to take her lasix after going home. She states she has gained 11 lbs since Friday  4.  Are you currently SOB? If she gets up and does anything she says she does some, but not a lot.  5.  Have you traveled recently? No

## 2016-10-02 NOTE — Telephone Encounter (Signed)
10/02/2016 (Late entry 9:30) 15:02  My Chart Message received in triage pool.  Reviewed notes and discussed case with DOD Dr Meda Coffee who ordered pt resume Lasix and KClor as previously taken, schedule appt for later this week.  Call to patient instructions provided, informed patient someone from scheduling would call later today with an appointment for this week.  Pt verbalized understanding. Upon hanging up noted another nurses documentation regarding this issue in chart.  Dr Curt Bears had been consulted previously. Writer spoke with Dr Curt Bears who agreed with Dr Francesca Oman instructions to resume lasix at previous dosing and frequency. Call back to patient who verbalized understanding.  Georgana Curio MHA RN CCM

## 2016-10-05 ENCOUNTER — Ambulatory Visit: Payer: PPO | Admitting: Cardiology

## 2016-10-10 ENCOUNTER — Encounter: Payer: Self-pay | Admitting: Cardiology

## 2016-10-10 ENCOUNTER — Ambulatory Visit (INDEPENDENT_AMBULATORY_CARE_PROVIDER_SITE_OTHER): Payer: PPO | Admitting: Cardiology

## 2016-10-10 VITALS — BP 110/68 | HR 70 | Ht 66.5 in | Wt 267.8 lb

## 2016-10-10 DIAGNOSIS — I481 Persistent atrial fibrillation: Secondary | ICD-10-CM

## 2016-10-10 DIAGNOSIS — I4819 Other persistent atrial fibrillation: Secondary | ICD-10-CM

## 2016-10-10 LAB — CUP PACEART INCLINIC DEVICE CHECK
Battery Impedance: 1300 Ohm
Battery Voltage: 2.79 V
Date Time Interrogation Session: 20180410132731
Implantable Lead Implant Date: 20081211
Implantable Lead Implant Date: 20081211
Implantable Lead Location: 753859
Implantable Lead Location: 753860
Implantable Pulse Generator Implant Date: 20081211
Lead Channel Impedance Value: 390 Ohm
Lead Channel Impedance Value: 423 Ohm
Lead Channel Pacing Threshold Amplitude: 0.5 V
Lead Channel Pacing Threshold Amplitude: 1.25 V
Lead Channel Pacing Threshold Pulse Width: 0.4 ms
Lead Channel Pacing Threshold Pulse Width: 1 ms
Lead Channel Sensing Intrinsic Amplitude: 0.8 mV
Lead Channel Sensing Intrinsic Amplitude: 11.5 mV
Lead Channel Setting Pacing Amplitude: 2 V
Lead Channel Setting Pacing Amplitude: 2.5 V
Lead Channel Setting Pacing Pulse Width: 1 ms
Lead Channel Setting Sensing Sensitivity: 2 mV
Pulse Gen Model: 5826
Pulse Gen Serial Number: 1999089

## 2016-10-10 NOTE — Progress Notes (Signed)
Electrophysiology Office Note   Date:  10/10/2016   ID:  DELFINA SCHREURS, DOB 1946-03-01, MRN [id number]  PCP:  Lottie Dawson, MD  Primary Electrophysiologist: Gaye Alken, MD    Chief Complaint  Patient presents with  . Pacemaker Check    PAF     History of Present Illness: Young JONITA HIROTA is a 71 y.o. female who presents today for electrophysiology evaluation.   She has a history of coronary artery disease status post stent to the RCA in 2004, CHF, atrial fibrillation with tachybradycardia syndrome status post pacemaker, CKD, hypertension, hyperlipidemia, inferior wall MI, sleep apnea on CPAP, stroke. She was doing well with her atrial fibrillation maintaining sinus rhythm on CPAP and dofetilide until this past summer where she developed an upper airway infection and required cardioversion. She subsequently developed atrial fibrillation 2 months ago. She is quite short of breath with her atrial fibrillation. She had a cardioversion 4 days ago,, but return to atrial fibrillation after 3 minutes. She has since started amiodarone. AF ablation 09/29/16. Since ablation, has had 11 lb weight gain. Was restarted on her lasix last week. She had a hematoma in her left leg that is improving. She is continuing to have some pain and discomfort from that. She says that she is able to walk and now fit into her normal clothing.   Today, she denies symptoms of chest pain, shortness of breath, orthopnea, PND, lower extremity edema, claudication, dizziness, presyncope, syncope, bleeding, or neurologic sequela. The patient is tolerating medications without difficulties.    Past Medical History:  Diagnosis Date  . Anticoagulant long-term use    pradaxa  . Anxiety   . Arthritis    low back & both knees, Spondylolisthesis    . CAD (coronary artery disease) 0277,4128   post PTCA with bare-metal stenting to mid RCA in December 2004     . CHF (congestive heart failure) ([name])   . Chronic  atrial fibrillation ([place]) 06/2007   Tachybradycardia pacemaker  . Chronic kidney disease    10% function - ?R, other kidney is compensating    . Depression   . Diplopia 06/19/2008   Qualifier: Diagnosis of  By: [name] MD, [name]   . Dysrhythmia    ATRIAL FIBRILATION  . Edema of lower extremity   . History of acute inferior wall MI   . History of CVA (cerebrovascular accident)   . Hyperlipidemia   . Hypertension   . Myocardial infarction [id number]  . OSA on CPAP    last test- 2010  . Pacemaker   . Pneumonia 2014   tx. ----  [facility]  . Shortness of breath   . Stroke ([place]) [id number]  . TIA (transient ischemic attack)   . Transfusion history   . Unspecified hemorrhoids without mention of complication 9/47/0962   Colonoscopy--Dr. Carlean Purl    Past Surgical History:  Procedure Laterality Date  . APPENDECTOMY    . ATRIAL FIBRILLATION ABLATION N/A 09/29/2016   Procedure: Atrial Fibrillation Ablation;  Surgeon: Gustavus Haskin Meredith Leeds, MD;  Location: [facility];  Service: Cardiovascular;  Laterality: N/A;  . back surgeries     x2-3 by Dr Trenton Gammon  . BACK SURGERY    . CHOLECYSTECTOMY  1994  . CORONARY ANGIOPLASTY WITH STENT PLACEMENT  1998  . CORONARY STENT PLACEMENT     C stent  . DOPPLER ECHOCARDIOGRAPHY  2009  . ELECTROPHYSIOLOGIC STUDY N/A 03/31/2016   Procedure: Cardioversion;  Surgeon: Evans Lance, MD;  Location: [facility];  Service: Cardiovascular;  Laterality: N/A;  . ELECTROPHYSIOLOGIC STUDY N/A 08/04/2016   Procedure: Cardioversion;  Surgeon: Evans Lance, MD;  Location: [facility];  Service: Cardiovascular;  Laterality: N/A;  . LEFT AND RIGHT HEART CATHETERIZATION WITH CORONARY ANGIOGRAM N/A 10/06/2013   Procedure: LEFT AND RIGHT HEART CATHETERIZATION WITH CORONARY ANGIOGRAM;  Surgeon: Troy Sine, MD;  Location: [facility] C[facility] LAB;  Service: Cardiovascular;  Laterality: N/A;  . permanent pacemaker  06/2007  . TEE WITHOUT CARDIOVERSION N/A  09/29/2016   Procedure: TRANSESOPHAGEAL ECHOCARDIOGRAM (TEE);  Surgeon: Jerline Pain, MD;  Location: [facility] ENDOSCOPY;  Service: Cardiovascular;  Laterality: N/A;  . TOTAL ABDOMINAL HYSTERECTOMY  1984   2 surgeries endometriosis bso     Current Outpatient Prescriptions  Medication Sig Dispense Refill  . acetaminophen (TYLENOL) 325 MG tablet Take 2 tablets (650 mg total) by mouth every 4 (four) hours as needed for headache or mild pain.    [name] amiodarone (PACERONE) 200 MG tablet Take 1 tablet (200 mg total) by mouth 2 (two) times daily. Start 08/06/16 - stop Tikosyn today 60 tablet 4  . apixaban (ELIQUIS) 5 MG TABS tablet Take 1 tablet (5 mg total) by mouth 2 (two) times daily. 60 tablet 6  . Ascorbic Acid (VITAMIN C) 1000 MG tablet Take 1,000 mg by mouth daily.    [name] atorvastatin (LIPITOR) 40 MG tablet TAKE 1 TABLET BY MOUTH EVERY DAY 90 tablet 2  . Calcium Carbonate-Vitamin D (CALCIUM 600+D PO) Take 1 tablet by mouth daily.    . Cyanocobalamin (B-12) 5000 MCG CAPS Take 5,000 mcg by mouth at bedtime.     [name] diltiazem (CARDIZEM CD) 120 MG 24 hr capsule Take 1 capsule (120 mg total) by mouth daily. 30 capsule 11  . FLUoxetine (PROZAC) 20 MG capsule TAKE ONE CAPSULE BY MOUTH EVERY MORNING 90 capsule 1  . fluticasone (FLONASE) 50 MCG/ACT nasal spray Place 1 spray into both nostrils daily as needed for allergies or rhinitis.    . furosemide (LASIX) 40 MG tablet Take 80 mg by mouth in the AM and Take 40 mg by mouth in the PM 90 tablet 10  . Lysine HCl 1000 MG TABS Take 1,000 mg by mouth daily.     . metoprolol succinate (TOPROL-XL) 50 MG 24 hr tablet Take 1 tablet (50 mg total) by mouth 2 (two) times daily. Take with or immediately following a meal. 60 tablet 11  . Multiple Vitamin (MULTIVITAMIN WITH MINERALS) TABS tablet Take 1 tablet by mouth daily. Centrum    . nitroGLYCERIN (NITROSTAT) 0.4 MG SL tablet Place 1 tablet (0.4 mg total) under the tongue every 5 (five) minutes x 3 doses as needed for chest  pain. 25 tablet 6  . Polyethyl Glycol-Propyl Glycol (SYSTANE OP) Place 1 drop into both eyes daily as needed (dry eyes).    . potassium chloride SA (KLOR-CON M20) 20 MEQ tablet Take 1 tablet (20 mEq total) by mouth daily. 180 tablet 3  . PRESCRIPTION MEDICATION Inhale into the lungs at bedtime. CPAP    . vitamin E 1000 UNIT capsule Take 1,000 Units by mouth daily.     No current facility-administered medications for this visit.     Allergies:   Adhesive [tape]   Social History:  The patient  reports that she quit smoking about 34 years ago. Her smoking use included Cigarettes. She started smoking about 38 years ago. She has a 5.00 pack-year smoking history. She has  never used smokeless tobacco. She reports that she drinks alcohol. She reports that she does not use drugs.   Family History:  The patient's family history includes Arrhythmia in her mother; Diabetes in her mother and paternal grandfather; Heart attack (age of onset: 35) in her brother; Heart disease in her paternal aunt; Hypertension in her mother; Prostate cancer in her maternal grandfather; Suicidality in her father.    ROS:  Please see the history of present illness.   Otherwise, review of systems is positive for Leg swelling, shortness of breath, palpitations, leg pain, facial swelling, dyspnea on exertion.   All other systems are reviewed and negative.    PHYSICAL EXAM: VS:  BP 110/68   Pulse 70   Ht 5' 6.5" (1.689 m)   Wt 267 lb 12.8 oz (121.5 kg)   SpO2 91%   BMI 42.58 kg/m  , BMI Body mass index is 42.58 kg/m. GEN: Well nourished, well developed, in no acute distress  HEENT: normal  Neck: no JVD, carotid bruits, or masses Cardiac: iRRR; no murmurs, rubs, or gallops,no edema  Respiratory:  clear to auscultation bilaterally, normal work of breathing GI: soft, nontender, nondistended, + BS MS: no deformity or atrophy  Skin: warm and dry,  device pocket is well healed Neuro:  Strength and sensation are  intact Psych: euthymic mood, full affect  EKG:  EKG is ordered today. Personal review of the ekg ordered  shows sinus rhythm, 1 degree AV block, rate 70   Device interrogation is reviewed today in detail.  See PaceArt for details.   Recent Labs: 03/26/2016: B Natriuretic Peptide 553.9 04/01/2016: ALT 53 07/24/2016: TSH 3.56 07/28/2016: Magnesium 2.3 09/18/2016: BUN 20; Creatinine, Ser 1.11; Potassium 4.0; Sodium 140 10/01/2016: Hemoglobin 11.9; Platelets 227    Lipid Panel     Component Value Date/Time   CHOL 100 03/31/2016 0248   TRIG 143 03/31/2016 0248   HDL 22 (L) 03/31/2016 0248   CHOLHDL 4.5 03/31/2016 0248   VLDL 29 03/31/2016 0248   LDLCALC 49 03/31/2016 0248     Wt Readings from Last 3 Encounters:  10/10/16 267 lb 12.8 oz (121.5 kg)  10/01/16 273 lb 8 oz (124.1 kg)  09/18/16 265 lb 6.4 oz (120.4 kg)      Other studies Reviewed: Additional studies/ records that were reviewed today include: TTE 03/28/16  Review of the above records today demonstrates:  - Left ventricle: The cavity size was normal. Wall thickness was   increased in a pattern of mild LVH. Systolic function was normal.   The estimated ejection fraction was in the range of 55% to 60%.   Indeterminant diastolic function (atrial fibrillation). Wall   motion was normal; there were no regional wall motion   abnormalities. - Aortic valve: There was no stenosis. - Mitral valve: Mildly calcified annulus. There was no significant   regurgitation. - Left atrium: The atrium was mildly dilated. - Right ventricle: The cavity size was normal. Pacer wire or   catheter noted in right ventricle. Systolic function was normal. - Tricuspid valve: Peak RV-RA gradient (S): 22 mm Hg. - Pulmonary arteries: PA peak pressure: 30 mm Hg (S). - Systemic veins: IVC measured 2.3 cm with normal respirophasic   variation, suggesting RA pressure 8 mmHg.   ASSESSMENT AND PLAN:  1.  Paroxysmal atrial fibrillation: On Eliquis. AF  ablation 09/29/16. Currently is in sinus rhythm and, based on her device interrogation, has been in sinus rhythm. We Tosh Glaze continue her amiodarone.  This  patients CHA2DS2-VASc Score and unadjusted Ischemic Stroke Rate (% per year) is equal to 3.2 % stroke rate/year from a score of 3  Above score calculated as 1 point each if present [CHF, HTN, DM, Vascular=MI/PAD/Aortic Plaque, Age if 65-74, or Female] Above score calculated as 2 points each if present [Age > 75, or Stroke/TIA/TE]   2. Hypertension: Well-controlled on current medications  3. Obesity: Weight loss encouraged  4. Chronic diastolic heart failure: Lasix was stopped after her ablation, but restarted last week. She is currently diuresing well. We Trinady Milewski continue current management.   5. Obstructive sleep apnea: on CPAP    Current medicines are reviewed at length with the patient today.   The patient does not have concerns regarding her medicines.  The following changes were made today:  none  Labs/ tests ordered today include: CBC, BMP No orders of the defined types were placed in this encounter.    Disposition:   FU with Kamaal Cast 3 months  Signed, Aniceto Kyser Meredith Leeds, MD  10/10/2016 9:38 AM     [facility] [address] [place]  [address] [phone] (office) [phone] (fax)

## 2016-10-10 NOTE — Patient Instructions (Signed)
Medication Instructions: Your physician recommends that you continue on your current medications as directed. Please refer to the Current Medication list given to you today.   Labwork: None ordered  Procedures/Testing: None ordered  Follow-Up: Keep your post ablation follow up appointments with Dr. Curt Bears and Roderic Palau  Any Additional Special Instructions Will Be Listed Below (If Applicable).     If you need a refill on your cardiac medications before your next appointment, please call your pharmacy.

## 2016-10-16 ENCOUNTER — Other Ambulatory Visit: Payer: Self-pay

## 2016-10-16 ENCOUNTER — Ambulatory Visit (HOSPITAL_COMMUNITY): Payer: PPO | Attending: Cardiology

## 2016-10-16 ENCOUNTER — Encounter: Payer: Self-pay | Admitting: Cardiovascular Disease

## 2016-10-16 ENCOUNTER — Ambulatory Visit (INDEPENDENT_AMBULATORY_CARE_PROVIDER_SITE_OTHER): Payer: PPO | Admitting: Cardiovascular Disease

## 2016-10-16 VITALS — BP 135/82 | HR 62 | Ht 66.5 in | Wt 265.0 lb

## 2016-10-16 DIAGNOSIS — I2510 Atherosclerotic heart disease of native coronary artery without angina pectoris: Secondary | ICD-10-CM | POA: Insufficient documentation

## 2016-10-16 DIAGNOSIS — I272 Pulmonary hypertension, unspecified: Secondary | ICD-10-CM | POA: Diagnosis not present

## 2016-10-16 DIAGNOSIS — R0602 Shortness of breath: Secondary | ICD-10-CM

## 2016-10-16 DIAGNOSIS — I5031 Acute diastolic (congestive) heart failure: Secondary | ICD-10-CM

## 2016-10-16 DIAGNOSIS — I340 Nonrheumatic mitral (valve) insufficiency: Secondary | ICD-10-CM | POA: Diagnosis not present

## 2016-10-16 LAB — COMPREHENSIVE METABOLIC PANEL
ALT: 26 IU/L (ref 0–32)
AST: 23 IU/L (ref 0–40)
Albumin/Globulin Ratio: 1.6 (ref 1.2–2.2)
Albumin: 4.5 g/dL (ref 3.5–4.8)
Alkaline Phosphatase: 118 IU/L — ABNORMAL HIGH (ref 39–117)
BUN/Creatinine Ratio: 15 (ref 12–28)
BUN: 16 mg/dL (ref 8–27)
Bilirubin Total: 0.9 mg/dL (ref 0.0–1.2)
CO2: 24 mmol/L (ref 18–29)
Calcium: 9.2 mg/dL (ref 8.7–10.3)
Chloride: 96 mmol/L (ref 96–106)
Creatinine, Ser: 1.08 mg/dL — ABNORMAL HIGH (ref 0.57–1.00)
GFR calc Af Amer: 60 mL/min/{1.73_m2} (ref >59)
GFR calc non Af Amer: 52 mL/min/{1.73_m2} — ABNORMAL LOW (ref >59)
Globulin, Total: 2.8 g/dL (ref 1.5–4.5)
Glucose: 112 mg/dL — ABNORMAL HIGH (ref 65–99)
Potassium: 4 mmol/L (ref 3.5–5.2)
Sodium: 141 mmol/L (ref 134–144)
Total Protein: 7.3 g/dL (ref 6.0–8.5)

## 2016-10-16 LAB — TSH: TSH: 4.39 u[IU]/mL (ref 0.450–4.500)

## 2016-10-16 LAB — CBC
Hematocrit: 36 % (ref 34.0–46.6)
Hemoglobin: 11.6 g/dL (ref 11.1–15.9)
MCH: 31.3 pg (ref 26.6–33.0)
MCHC: 32.2 g/dL (ref 31.5–35.7)
MCV: 97 fL (ref 79–97)
Platelets: 364 10*3/uL (ref 150–379)
RBC: 3.71 x10E6/uL — ABNORMAL LOW (ref 3.77–5.28)
RDW: 15.8 % — ABNORMAL HIGH (ref 12.3–15.4)
WBC: 10.3 10*3/uL (ref 3.4–10.8)

## 2016-10-16 LAB — ECHOCARDIOGRAM COMPLETE
Height: 66.5 in
Weight: 4240 oz

## 2016-10-16 LAB — LIPID PANEL
Chol/HDL Ratio: 2.8 ratio (ref 0.0–4.4)
Cholesterol, Total: 116 mg/dL (ref 100–199)
HDL: 42 mg/dL (ref >39)
LDL Calculated: 46 mg/dL (ref 0–99)
Triglycerides: 139 mg/dL (ref 0–149)
VLDL Cholesterol Cal: 28 mg/dL (ref 5–40)

## 2016-10-16 NOTE — Patient Instructions (Addendum)
Medication Instructions:  Your physician recommends that you continue on your current medications as directed. Please refer to the Current Medication list given to you today.   Labwork: TODAY - CBC, CMET, TSH, Lipid    Testing/Procedures: Your physician has requested that you have an echocardiogram. Echocardiography is a painless test that uses sound waves to create images of your heart. It provides your doctor with information about the size and shape of your heart and how well your heart's chambers and valves are working. This procedure takes approximately one hour. There are no restrictions for this procedure.   Follow-Up: Your physician recommends that you schedule a follow-up appointment in: 1-2 months with Dr. Acie Fredrickson   If you need a refill on your cardiac medications before your next appointment, please call your pharmacy.   Thank you for choosing [facility]! Christen Bame, RN [phone]

## 2016-10-16 NOTE — Progress Notes (Signed)
[name] Date of Birth  1946/04/23 [facility] [address]    [place], [place]  [address] [phone]  Fax  [phone]   Problem list: 1. Atrial fibrillation - s/p ablation September 29, 2016 2. Status post pacemaker implantation 3. Coronary artery disease-status post PTCA and stenting of her right coronary artery 4. Hyperlipidemia 5. Hypertension 6. Obstructive sleep apnea-currently on CPAP 7.   History of Present Illness:  [name] is a 71 year old female with a history of atrial fibrillation. She status post pacemaker implantation. She also has a history of coronary artery disease and is status post PTCA and stenting of the right coronary artery. She also has a history of hyperlipidemia, hypertension, and sleep apnea.  She uses a CPAP mask at night.  The CPAP mask has really helped her stay in normal sinus rhythm.  She complains of having some episodes of chest tightness. This occurs at times. One episode was relieved with sublingual nitroglycerin. She states that it is not nearly as severe as her previous episodes of angina.  January 13, 21014: She was hospitalized in November with severe shortness breath and "double pneumonia". She is slowly recovering.  She's looking forward to  getting back into her exercise program.  She has not been exercising at all.  She's had some leg edema.  She denies any chest pain or shortness breath.  She's has mild leg swelling for the past several months. She does not add salt any of her food. She tries to stay away from salty foods and processed meat.  September 18, 2013:  She remains short of breath  - especialy with exertion  Echo in Jan. 2014:   - Left ventricle: The cavity size was normal. Wall thickness was increased in a pattern of mild LVH. Systolic function was normal. The estimated ejection fraction was in the range of 60% to 65%. - Pulmonary arteries: PA peak pressure: 59mm Hg   October 03, 2013:  Level was seen about  2 weeks ago with some increasing shortness of breath. She was noted to have moderate pulmonary hypertension on echo. She started on diltiazem. She has fairly well preserved left ventricle systolic function.  We started her on Cardizem XL 180 mg a day. She has not done well since that medication in addition. She's more short of breath. She's gained about 5 pounds.  She presents today for her echocardiogram. She is very short of breath and was worked into the schedule. Her O2 saturations at rest were 86%. We placed  oxygen on her and her O2 saturations increased above 90%. She is clearly more short of breath after the addition of the diltiazem.   She has not been eating any extra salt .  Has been taking her lasix.   She denies any chest pain - specifically was asked about pleuretic CP.  Had orthopnea until we placed the oxygen on her.   10/20/2013: [name] was admitted to the hospital with severe shortness breath when I saw her several weeks ago. She was found to be volume overload. She was aggressively diuresed and lost about 10 pounds of weight. Cardiac catheterization following that diuresis revealed only minimal coronary artery irregularities. Her left ventricular systolic function was normal. Her PA pressures were moderately elevated with an estimated PA pressure of around 57.   She was discharged on the same dose of Lasix that she was on at admission.   She is avoiding salt.    Dec. 3, 2015:  [name] is a 71 yo followed for chronic diastolic CHf and pulmonary hypertension ( est. PA pressure of 70)  [name] has been back in the hospital since I last saw her.  She has  Increased her lasix which has helped.  She does a pretty good job of avoiding salt.   Sept. 27, 2016:  Is doing well.  Has some dyspnea - she thinks likely due to weight gain . No cardiac symptoms .  Needs to have back surgery .   October 06, 2015:  Overall doing well. Had her back surgery in Oct.  Wt. 267 lbs today   Jan. 4,  2018:  [name] is seen back today following a recent hospitalization in Sept. 2017.   She was complaining of dyspnea , cough. She was cardioverted  She has diastolic dysfunction   She is now back in atrial fib. She new that she had converted back into a-fib.   HR are typically elevated when she is in atrial fib.   Has had some right flank pain .   Was found to have an atrophic right kidney.  Was in the hospital in [place], [place].   Has not tried a higher dose of Tikosyn.    Labs from her recent hospitalization on 06/24/2016 show a potassium level of 4.3. Her magnesium level was 2.0.  October 16, 2016:  [name] is seen today  She has had an A-Fib ablation ( September 29, 2016) since I have seen her She is very shaky today - having trouble getting her breath while walking in today , her lasix was held following her ablation.    She restarted her Lasix 2 weeks ago .   She is feeling generally better since restarting the Lasix       Current Outpatient Prescriptions on File Prior to Visit  Medication Sig Dispense Refill  . acetaminophen (TYLENOL) 325 MG tablet Take 2 tablets (650 mg total) by mouth every 4 (four) hours as needed for headache or mild pain.    [name] amiodarone (PACERONE) 200 MG tablet Take 1 tablet (200 mg total) by mouth 2 (two) times daily. Start 08/06/16 - stop Tikosyn today 60 tablet 4  . apixaban (ELIQUIS) 5 MG TABS tablet Take 1 tablet (5 mg total) by mouth 2 (two) times daily. 60 tablet 6  . Ascorbic Acid (VITAMIN C) 1000 MG tablet Take 1,000 mg by mouth daily.    [name] atorvastatin (LIPITOR) 40 MG tablet TAKE 1 TABLET BY MOUTH EVERY DAY 90 tablet 2  . Calcium Carbonate-Vitamin D (CALCIUM 600+D PO) Take 1 tablet by mouth daily.    . Cyanocobalamin (B-12) 5000 MCG CAPS Take 5,000 mcg by mouth at bedtime.     [name] diltiazem (CARDIZEM CD) 120 MG 24 hr capsule Take 1 capsule (120 mg total) by mouth daily. 30 capsule 11  . FLUoxetine (PROZAC) 20 MG capsule TAKE ONE CAPSULE BY MOUTH EVERY MORNING 90  capsule 1  . fluticasone (FLONASE) 50 MCG/ACT nasal spray Place 1 spray into both nostrils daily as needed for allergies or rhinitis.    . furosemide (LASIX) 40 MG tablet Take 80 mg by mouth in the AM and Take 40 mg by mouth in the PM 90 tablet 10  . Lysine HCl 1000 MG TABS Take 1,000 mg by mouth daily.     . metoprolol succinate (TOPROL-XL) 50 MG 24 hr tablet Take 1 tablet (50 mg total) by mouth 2 (two) times daily. Take with or immediately following a meal. 60  tablet 11  . Multiple Vitamin (MULTIVITAMIN WITH MINERALS) TABS tablet Take 1 tablet by mouth daily. Centrum    . nitroGLYCERIN (NITROSTAT) 0.4 MG SL tablet Place 1 tablet (0.4 mg total) under the tongue every 5 (five) minutes x 3 doses as needed for chest pain. 25 tablet 6  . Polyethyl Glycol-Propyl Glycol (SYSTANE OP) Place 1 drop into both eyes daily as needed (dry eyes).    . potassium chloride SA (KLOR-CON M20) 20 MEQ tablet Take 1 tablet (20 mEq total) by mouth daily. 180 tablet 3  . PRESCRIPTION MEDICATION Inhale into the lungs at bedtime. CPAP    . vitamin E 1000 UNIT capsule Take 1,000 Units by mouth daily.     No current facility-administered medications on file prior to visit.     Allergies  Allergen Reactions  . Adhesive [Tape]     Allergic to EKG stickers and defibrillation pads.    Past Medical History:  Diagnosis Date  . Anticoagulant long-term use    pradaxa  . Anxiety   . Arthritis    low back & both knees, Spondylolisthesis    . CAD (coronary artery disease) 2683,4196   post PTCA with bare-metal stenting to mid RCA in December 2004     . CHF (congestive heart failure) ([place])   . Chronic atrial fibrillation ([place]) 06/2007   Tachybradycardia pacemaker  . Chronic kidney disease    10% function - ?R, other kidney is compensating    . Depression   . Diplopia 06/19/2008   Qualifier: Diagnosis of  By: [name] MD, [name]   . Dysrhythmia    ATRIAL FIBRILATION  . Edema of lower extremity   . History of acute  inferior wall MI   . History of CVA (cerebrovascular accident)   . Hyperlipidemia   . Hypertension   . Myocardial infarction ([place]) [id number]  . OSA on CPAP    last test- 2010  . Pacemaker   . Pneumonia 2014   tx. ----  [facility]  . Shortness of breath   . Stroke ([place]) [id number]  . TIA (transient ischemic attack)   . Transfusion history   . Unspecified hemorrhoids without mention of complication 08/24/9796   Colonoscopy--Dr. Carlean Purl     Past Surgical History:  Procedure Laterality Date  . APPENDECTOMY    . ATRIAL FIBRILLATION ABLATION N/A 09/29/2016   Procedure: Atrial Fibrillation Ablation;  Surgeon: Will Meredith Leeds, MD;  Location: [facility];  Service: Cardiovascular;  Laterality: N/A;  . back surgeries     x2-3 by Dr Trenton Gammon  . BACK SURGERY    . CHOLECYSTECTOMY  1994  . CORONARY ANGIOPLASTY WITH STENT PLACEMENT  1998  . CORONARY STENT PLACEMENT     C stent  . DOPPLER ECHOCARDIOGRAPHY  2009  . ELECTROPHYSIOLOGIC STUDY N/A 03/31/2016   Procedure: Cardioversion;  Surgeon: Evans Lance, MD;  Location: [facility];  Service: Cardiovascular;  Laterality: N/A;  . ELECTROPHYSIOLOGIC STUDY N/A 08/04/2016   Procedure: Cardioversion;  Surgeon: Evans Lance, MD;  Location: [facility];  Service: Cardiovascular;  Laterality: N/A;  . LEFT AND RIGHT HEART CATHETERIZATION WITH CORONARY ANGIOGRAM N/A 10/06/2013   Procedure: LEFT AND RIGHT HEART CATHETERIZATION WITH CORONARY ANGIOGRAM;  Surgeon: Troy Sine, MD;  Location: [facility];  Service: Cardiovascular;  Laterality: N/A;  . permanent pacemaker  06/2007  . TEE WITHOUT CARDIOVERSION N/A 09/29/2016   Procedure: TRANSESOPHAGEAL ECHOCARDIOGRAM (TEE);  Surgeon: Jerline Pain, MD;  Location:  [facility];  Service: Cardiovascular;  Laterality: N/A;  . TOTAL ABDOMINAL HYSTERECTOMY  1984   2 surgeries endometriosis bso    History  Smoking Status  . Former Smoker  . Packs/day: 1.00  . Years: 5.00  . Types:  Cigarettes  . Start date: 05/11/1978  . Quit date: 07/03/1982  Smokeless Tobacco  . Never Used    History  Alcohol Use  . Yes    Comment: once q 6 months-socailly-wine    Family History  Problem Relation Age of Onset  . Suicidality Father     suicide death pt was 3 yrs  . Arrhythmia Mother   . Hypertension Mother   . Diabetes Mother   . Heart attack Brother 59  . Heart disease Paternal Aunt   . Prostate cancer Maternal Grandfather   . Diabetes Paternal Grandfather     fathers side of the family    Reviw of Systems:  Reviewed in the HPI.  All other systems are negative.  Physical Exam: BP 135/82 (BP Location: Right Arm, Patient Position: Sitting, Cuff Size: Large)   Pulse 62  The patient is alert and oriented x 3.     HEENT:   She has normal carotids.  no JVD,   Her mucous membranes are moist. Lungs: Lung exam is clear.  Heart: Regular rate S1-S2.  No S3 heard Abdomen: Her abdomen is soft. She has good bowel sounds. Extremities:  She has 1-2+ bilateral edema  Neuro:  Her gait is normal. Her neuro exam is nonfocal.    ECG: October 16, 2016:    NSR at 65.   No ST or T wave changes   Assessment / Plan:  1. Acute on chronic diastolic CHF:   She's been having significant shortness of breath with exertion since her ablation. Her Lasix was discontinued after the ablation. She restarted the Lasix several weeks ago and is generally and gradually feeling better. She still has some leg edema. She has been avoiding eating any extra salt. We will repeat her echocardiogram to further evaluate her LV function and diastolic function. We also need to make sure that she does not have pericardial effusion.   1. Atrial fibrillation -   She has had an ablation since I last saw her . She has had more shortness of breath Will get an echo to ensure that she does not have a pericardial effusion. Will check  CBC, CMET, TSH, lipids  Will see her in 1-2 months.  She is generally better since  restarting LAsix .   2. Status post pacemaker implantation 3. Coronary artery disease-status post PTCA and stenting of her right coronary artery - no angina   4. Hyperlipidemia -   Will check lipids today .   5. Hypertension -  BP is well controlled   6. Obstructive sleep apnea-currently on CPAP 7. Spinal disc disease.       Mertie Moores, MD  10/16/2016 8:18 AM    [facility] [place],  [place], [place]  [address] Pager [phone] Phone: [phone]; Fax: [phone]

## 2016-10-16 NOTE — Progress Notes (Signed)
Chief Complaint  Patient presents with  . Follow-up    HPI: [name] 71 y.o. come in for Chronic disease management   Followed by cards for chf afib flutter   S/p pacer a CV  Last ov here was jan with elevated tsh and  Prediabetes  She's had some morning cough with postnasal drainage. No specific wheezing. Has been having shortness of breath over the number of months. She's had evaluation by cardiology. Diagnosis diastolic heart failure. History of atrial fib flutter. Tends to get allergies this time year nose congestion left ear is popping. Is not using her Flonase at this time. No hemoptysis no specific wheezing. Did have a history of asthma as a child question grew out of it. Last chest x-ray was the end of March which was normal. Isn't checking her blood sugar. Did have laboratory tests done by Dr. Algis Greenhouse is week. ROS: See pertinent positives and negatives per HPI.  Past Medical History:  Diagnosis Date  . Anticoagulant long-term use    pradaxa  . Anxiety   . Arthritis    low back & both knees, Spondylolisthesis    . CAD (coronary artery disease) 1448,1856   post PTCA with bare-metal stenting to mid RCA in December 2004     . CHF (congestive heart failure) ([place])   . Chronic atrial fibrillation ([place]) 06/2007   Tachybradycardia pacemaker  . Chronic kidney disease    10% function - ?R, other kidney is compensating    . Depression   . Diplopia 06/19/2008   Qualifier: Diagnosis of  By: [name] MD, [name]   . Dysrhythmia    ATRIAL FIBRILATION  . Edema of lower extremity   . History of acute inferior wall MI   . History of CVA (cerebrovascular accident)   . Hyperlipidemia   . Hypertension   . Myocardial infarction ([place]) [id number]  . OSA on CPAP    last test- 2010  . Pacemaker   . Pneumonia 2014   tx. ----  [facility]  . Shortness of breath   . Stroke ([place]) [id number]  . TIA (transient ischemic attack)   . Transfusion history   . Unspecified hemorrhoids  without mention of complication 09/13/9700   Colonoscopy--Dr. Carlean Purl     Family History  Problem Relation Age of Onset  . Suicidality Father     suicide death pt was 3 yrs  . Arrhythmia Mother   . Hypertension Mother   . Diabetes Mother   . Heart attack Brother 34  . Heart disease Paternal Aunt   . Prostate cancer Maternal Grandfather   . Diabetes Paternal Grandfather     fathers side of the family    Social History   Social History  . Marital status: Married    Spouse name: N/A  . Number of children: 0  . Years of education: HS   Occupational History  . retired     previously worked [employer]  . Smoking status: Former Smoker    Packs/day: 1.00    Years: 5.00    Types: Cigarettes    Start date: 05/11/1978    Quit date: 07/03/1982  . Smokeless tobacco: Never Used  . Alcohol use Yes     Comment: once q 6 months-socailly-wine  . Drug use: No  . Sexual activity: Not Currently   Other Topics Concern  . 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  Married    Originally from [employer] [place] of two, high school education   Former smoker [phone] 1ppd   Hunting dogs 7    Retired from [employer] 2     Outpatient Medications Prior to Visit  Medication Sig Dispense Refill  . acetaminophen (TYLENOL) 325 MG tablet Take 2 tablets (650 mg total) by mouth every 4 (four) hours as needed for headache or mild pain.    [name] amiodarone (PACERONE) 200 MG tablet Take 1 tablet (200 mg total) by mouth 2 (two) times daily. Start 08/06/16 - stop Tikosyn today 60 tablet 4  . apixaban (ELIQUIS) 5 MG TABS tablet Take 1 tablet (5 mg total) by mouth 2 (two) times daily. 60 tablet 6  . Ascorbic Acid (VITAMIN C) 1000 MG tablet Take 1,000 mg by mouth daily.    [name] atorvastatin (LIPITOR) 40 MG tablet TAKE 1 TABLET BY MOUTH EVERY DAY 90 tablet 2  . Calcium Carbonate-Vitamin D (CALCIUM  600+D PO) Take 1 tablet by mouth daily.    . Cyanocobalamin (B-12) 5000 MCG CAPS Take 5,000 mcg by mouth at bedtime.     [name] diltiazem (CARDIZEM CD) 120 MG 24 hr capsule Take 1 capsule (120 mg total) by mouth daily. 30 capsule 11  . FLUoxetine (PROZAC) 20 MG capsule TAKE ONE CAPSULE BY MOUTH EVERY MORNING 90 capsule 1  . fluticasone (FLONASE) 50 MCG/ACT nasal spray Place 1 spray into both nostrils daily as needed for allergies or rhinitis.    . furosemide (LASIX) 40 MG tablet Take 80 mg by mouth in the AM and Take 40 mg by mouth in the PM 90 tablet 10  . Lysine HCl 1000 MG TABS Take 1,000 mg by mouth daily.     . metoprolol succinate (TOPROL-XL) 50 MG 24 hr tablet Take 1 tablet (50 mg total) by mouth 2 (two) times daily. Take with or immediately following a meal. 60 tablet 11  . Multiple Vitamin (MULTIVITAMIN WITH MINERALS) TABS tablet Take 1 tablet by mouth daily. Centrum    . Polyethyl Glycol-Propyl Glycol (SYSTANE OP) Place 1 drop into both eyes daily as needed (dry eyes).    . potassium chloride SA (KLOR-CON M20) 20 MEQ tablet Take 1 tablet (20 mEq total) by mouth daily. 180 tablet 3  . PRESCRIPTION MEDICATION Inhale into the lungs at bedtime. CPAP    . vitamin E 1000 UNIT capsule Take 1,000 Units by mouth daily.    . nitroGLYCERIN (NITROSTAT) 0.4 MG SL tablet Place 1 tablet (0.4 mg total) under the tongue every 5 (five) minutes x 3 doses as needed for chest pain. (Patient not taking: Reported on 10/17/2016) 25 tablet 6   No facility-administered medications prior to visit.      EXAM:  BP 120/62 (BP Location: Right Arm, Patient Position: Sitting, Cuff Size: Large)   Pulse 65   Temp 98.4 F (36.9 C) (Oral)   Ht 5' 6.5" (1.689 m)   Wt 263 lb 9.6 oz (119.6 kg)   BMI 41.91 kg/m   Body mass index is 41.91 kg/m.  GENERAL: vitals reviewed and listed above, alert, oriented, appears well hydrated and in no acute distress Intermittent dry cough and throat clearing. Mild upper respiratory  congestion HEENT: atraumatic, conjunctiva  clear, no obvious abnormalities on inspection of external nose and ears TMs are clear small amount wax in the left face nontender OP : no lesion edema or exudate drainage tracts noted NECK:  no obvious masses on inspection palpation  LUNGS: clear to auscultation bilaterally, no wheezes, rales or rhonchi, equal breath sounds CV: HRRR, no clubbing cyanosis slight  nl cap refill  MS: moves all extremities without noticeable focal  abnormality PSYCH: pleasant and cooperative, no obvious depression or anxiety Lab Results  Component Value Date   WBC 10.3 10/16/2016   HGB 11.9 (L) 10/01/2016   HCT 36.0 10/16/2016   PLT 364 10/16/2016   GLUCOSE 112 (H) 10/16/2016   CHOL 116 10/16/2016   TRIG 139 10/16/2016   HDL 42 10/16/2016   LDLCALC 46 10/16/2016   ALT 26 10/16/2016   AST 23 10/16/2016   NA 141 10/16/2016   K 4.0 10/16/2016   CL 96 10/16/2016   CREATININE 1.08 (H) 10/16/2016   BUN 16 10/16/2016   CO2 24 10/16/2016   TSH 4.390 10/16/2016   INR 1.1 08/02/2016   HGBA1C 6.9 (H) 07/24/2016   BP Readings from Last 3 Encounters:  10/17/16 120/62  10/16/16 135/82  10/10/16 110/68   Wt Readings from Last 3 Encounters:  10/17/16 263 lb 9.6 oz (119.6 kg)  10/16/16 265 lb (120.2 kg)  10/10/16 267 lb 12.8 oz (121.5 kg)   Reviewed echocardiogram with her showing diastolic dysfunction no acute findings PAPin the 50s.  ASSESSMENT AND PLAN:  Discussed the following assessment and plan:  Pre-diabetes early DM  - to early  for a1c but random bg was 119 and acceptable will follow   Medication management  Chronic diastolic heart failure- grade 2 diastolic dysfunction by echo 10/03/13 - Plan: Pulmonary function test  Elevated TSH - acceptable range   Dyspnea, unspecified type - under cards eval consider pulm causes also   - Plan: Pulmonary function test  Cough, persistent - Plan: Pulmonary function test  Post-nasal drainage - in spring  see text     Cr stable  Blood went down  After ablation .  Bleeding into thigh.   Suspect some of her dyspnea  Could be from asthmatic or  pulm cause cause has allergy and  Childhood asthnma hx and echo inc pap  daistolic dysfunction.   Get spirometry  consider steroid burst  . pulm input .   Add ctm flonase if   No CI from cards .  -Patient advised to return or notify health care team  if  new concerns arise.  Patient Instructions  I think we should get lung function tests to see if  Asthmatic type condition is contributing  To your shortness of breath.   Could be allergy triggered or elsewhere.  consdier ation of treatment for asthma .   Will be  Contacted about appt for this .  Please start the flonase every day  2 sprays  Each nostroil may take 1-2 weeks to work for your sinus drainage   Also add chlorpheniramine .  At night .  Will send a message to your cards team.   Plan ROV  After PFts dpone   You may be helped by  Inhalers  Or other medicaitons.     [name]. Hellena Pridgen M.D.

## 2016-10-17 ENCOUNTER — Encounter: Payer: Self-pay | Admitting: Internal Medicine

## 2016-10-17 ENCOUNTER — Ambulatory Visit (INDEPENDENT_AMBULATORY_CARE_PROVIDER_SITE_OTHER): Payer: PPO | Admitting: Internal Medicine

## 2016-10-17 VITALS — BP 120/62 | HR 65 | Temp 98.4°F | Ht 66.5 in | Wt 263.6 lb

## 2016-10-17 DIAGNOSIS — R0982 Postnasal drip: Secondary | ICD-10-CM

## 2016-10-17 DIAGNOSIS — R0600 Dyspnea, unspecified: Secondary | ICD-10-CM | POA: Diagnosis not present

## 2016-10-17 DIAGNOSIS — R05 Cough: Secondary | ICD-10-CM

## 2016-10-17 DIAGNOSIS — Z79899 Other long term (current) drug therapy: Secondary | ICD-10-CM | POA: Diagnosis not present

## 2016-10-17 DIAGNOSIS — R7303 Prediabetes: Secondary | ICD-10-CM | POA: Diagnosis not present

## 2016-10-17 DIAGNOSIS — I5032 Chronic diastolic (congestive) heart failure: Secondary | ICD-10-CM | POA: Diagnosis not present

## 2016-10-17 DIAGNOSIS — R946 Abnormal results of thyroid function studies: Secondary | ICD-10-CM

## 2016-10-17 DIAGNOSIS — R053 Chronic cough: Secondary | ICD-10-CM

## 2016-10-17 DIAGNOSIS — R7989 Other specified abnormal findings of blood chemistry: Secondary | ICD-10-CM

## 2016-10-17 NOTE — Patient Instructions (Signed)
I think we should get lung function tests to see if  Asthmatic type condition is contributing  To your shortness of breath.   Could be allergy triggered or elsewhere.  consdier ation of treatment for asthma .   Will be  Contacted about appt for this .  Please start the flonase every day  2 sprays  Each nostroil may take 1-2 weeks to work for your sinus drainage   Also add chlorpheniramine .  At night .  Will send a message to your cards team.   Plan ROV  After PFts dpone   You may be helped by  Inhalers  Or other medicaitons.

## 2016-10-23 ENCOUNTER — Other Ambulatory Visit: Payer: Self-pay

## 2016-10-23 ENCOUNTER — Ambulatory Visit (HOSPITAL_COMMUNITY)
Admission: RE | Admit: 2016-10-23 | Discharge: 2016-10-23 | Disposition: A | Discharge status: Home / Self Care | Payer: PPO | Source: Ambulatory Visit | Attending: Nurse Practitioner | Admitting: Nurse Practitioner

## 2016-10-23 ENCOUNTER — Encounter (HOSPITAL_COMMUNITY): Payer: Self-pay | Admitting: Nurse Practitioner

## 2016-10-23 VITALS — BP 114/70 | HR 61 | Ht 66.5 in | Wt 262.2 lb

## 2016-10-23 DIAGNOSIS — I482 Chronic atrial fibrillation: Secondary | ICD-10-CM | POA: Diagnosis not present

## 2016-10-23 DIAGNOSIS — Z833 Family history of diabetes mellitus: Secondary | ICD-10-CM | POA: Insufficient documentation

## 2016-10-23 DIAGNOSIS — I252 Old myocardial infarction: Secondary | ICD-10-CM | POA: Insufficient documentation

## 2016-10-23 DIAGNOSIS — G4733 Obstructive sleep apnea (adult) (pediatric): Secondary | ICD-10-CM | POA: Insufficient documentation

## 2016-10-23 DIAGNOSIS — Z7902 Long term (current) use of antithrombotics/antiplatelets: Secondary | ICD-10-CM | POA: Diagnosis not present

## 2016-10-23 DIAGNOSIS — I509 Heart failure, unspecified: Secondary | ICD-10-CM | POA: Diagnosis not present

## 2016-10-23 DIAGNOSIS — Z950 Presence of cardiac pacemaker: Secondary | ICD-10-CM | POA: Insufficient documentation

## 2016-10-23 DIAGNOSIS — Z8042 Family history of malignant neoplasm of prostate: Secondary | ICD-10-CM | POA: Diagnosis not present

## 2016-10-23 DIAGNOSIS — F419 Anxiety disorder, unspecified: Secondary | ICD-10-CM | POA: Insufficient documentation

## 2016-10-23 DIAGNOSIS — N189 Chronic kidney disease, unspecified: Secondary | ICD-10-CM | POA: Insufficient documentation

## 2016-10-23 DIAGNOSIS — Z90710 Acquired absence of both cervix and uterus: Secondary | ICD-10-CM | POA: Diagnosis not present

## 2016-10-23 DIAGNOSIS — Z9109 Other allergy status, other than to drugs and biological substances: Secondary | ICD-10-CM | POA: Insufficient documentation

## 2016-10-23 DIAGNOSIS — Z8673 Personal history of transient ischemic attack (TIA), and cerebral infarction without residual deficits: Secondary | ICD-10-CM | POA: Diagnosis not present

## 2016-10-23 DIAGNOSIS — Z87891 Personal history of nicotine dependence: Secondary | ICD-10-CM | POA: Diagnosis not present

## 2016-10-23 DIAGNOSIS — Z9049 Acquired absence of other specified parts of digestive tract: Secondary | ICD-10-CM | POA: Insufficient documentation

## 2016-10-23 DIAGNOSIS — Z955 Presence of coronary angioplasty implant and graft: Secondary | ICD-10-CM | POA: Diagnosis not present

## 2016-10-23 DIAGNOSIS — E785 Hyperlipidemia, unspecified: Secondary | ICD-10-CM | POA: Diagnosis not present

## 2016-10-23 DIAGNOSIS — I130 Hypertensive heart and chronic kidney disease with heart failure and stage 1 through stage 4 chronic kidney disease, or unspecified chronic kidney disease: Secondary | ICD-10-CM | POA: Diagnosis not present

## 2016-10-23 DIAGNOSIS — Z8249 Family history of ischemic heart disease and other diseases of the circulatory system: Secondary | ICD-10-CM | POA: Diagnosis not present

## 2016-10-23 DIAGNOSIS — I481 Persistent atrial fibrillation: Secondary | ICD-10-CM | POA: Diagnosis not present

## 2016-10-23 DIAGNOSIS — I4819 Other persistent atrial fibrillation: Secondary | ICD-10-CM

## 2016-10-23 DIAGNOSIS — Z9889 Other specified postprocedural states: Secondary | ICD-10-CM | POA: Insufficient documentation

## 2016-10-23 DIAGNOSIS — I2510 Atherosclerotic heart disease of native coronary artery without angina pectoris: Secondary | ICD-10-CM | POA: Diagnosis not present

## 2016-10-23 DIAGNOSIS — F329 Major depressive disorder, single episode, unspecified: Secondary | ICD-10-CM | POA: Diagnosis not present

## 2016-10-23 NOTE — Progress Notes (Signed)
Primary Care Physician: Lottie Dawson, MD Referring Physician: [name] is a 71 y.o. female with a h/o atrial fibrillation. She status post pacemaker implantation. She also has a history of coronary artery disease and is status post PTCA and stenting of the right coronary artery. She also has a history of hyperlipidemia, hypertension, and sleep apnea.  She uses a CPAP mask at night.   She underwent an afib ablation with Dr. Curt Bears 09/29/16. She developed a hematoma with subsequent hypotension and was kept in the hospital an additional night. That has resolved. She denies any dysphagia. She has not experienced any afib that she has been aware of. Continues on amiodarone. No interruption in eliquis.  Today, she denies symptoms of palpitations, chest pain, shortness of breath, orthopnea, PND, lower extremity edema, dizziness, presyncope, syncope, or neurologic sequela. The patient is tolerating medications without difficulties and is otherwise without complaint today.   Past Medical History:  Diagnosis Date  . Anticoagulant long-term use    pradaxa  . Anxiety   . Arthritis    low back & both knees, Spondylolisthesis    . CAD (coronary artery disease) 8657,8469   post PTCA with bare-metal stenting to mid RCA in December 2004     . CHF (congestive heart failure) ([place])   . Chronic atrial fibrillation ([place]) 06/2007   Tachybradycardia pacemaker  . Chronic kidney disease    10% function - ?R, other kidney is compensating    . Depression   . Diplopia 06/19/2008   Qualifier: Diagnosis of  By: [name] MD, [name]   . Dysrhythmia    ATRIAL FIBRILATION  . Edema of lower extremity   . History of acute inferior wall MI   . History of CVA (cerebrovascular accident)   . Hyperlipidemia   . Hypertension   . Myocardial infarction ([place]) [id number]  . OSA on CPAP    last test- 2010  . Pacemaker   . Pneumonia 2014   tx. ----  [facility]  . Shortness of breath   .  Stroke ([place]) [id number]  . TIA (transient ischemic attack)   . Transfusion history   . Unspecified hemorrhoids without mention of complication 12/30/5282   Colonoscopy--Dr. Carlean Purl    Past Surgical History:  Procedure Laterality Date  . APPENDECTOMY    . ATRIAL FIBRILLATION ABLATION N/A 09/29/2016   Procedure: Atrial Fibrillation Ablation;  Surgeon: Will Meredith Leeds, MD;  Location: [facility] CV LAB;  Service: Cardiovascular;  Laterality: N/A;  . back surgeries     x2-3 by Dr Trenton Gammon  . BACK SURGERY    . CHOLECYSTECTOMY  1994  . CORONARY ANGIOPLASTY WITH STENT PLACEMENT  1998  . CORONARY STENT PLACEMENT     C stent  . DOPPLER ECHOCARDIOGRAPHY  2009  . ELECTROPHYSIOLOGIC STUDY N/A 03/31/2016   Procedure: Cardioversion;  Surgeon: Evans Lance, MD;  Location: [place] CV LAB;  Service: Cardiovascular;  Laterality: N/A;  . ELECTROPHYSIOLOGIC STUDY N/A 08/04/2016   Procedure: Cardioversion;  Surgeon: Evans Lance, MD;  Location: [place] CV LAB;  Service: Cardiovascular;  Laterality: N/A;  . LEFT AND RIGHT HEART CATHETERIZATION WITH CORONARY ANGIOGRAM N/A 10/06/2013   Procedure: LEFT AND RIGHT HEART CATHETERIZATION WITH CORONARY ANGIOGRAM;  Surgeon: Troy Sine, MD;  Location: [facility] CATH LAB;  Service: Cardiovascular;  Laterality: N/A;  . permanent pacemaker  06/2007  . TEE WITHOUT CARDIOVERSION N/A 09/29/2016   Procedure: TRANSESOPHAGEAL ECHOCARDIOGRAM (TEE);  Surgeon: Jerline Pain, MD;  Location: [facility] ENDOSCOPY;  Service: Cardiovascular;  Laterality: N/A;  . TOTAL ABDOMINAL HYSTERECTOMY  1984   2 surgeries endometriosis bso    Current Outpatient Prescriptions  Medication Sig Dispense Refill  . acetaminophen (TYLENOL) 325 MG tablet Take 2 tablets (650 mg total) by mouth every 4 (four) hours as needed for headache or mild pain.    [name] amiodarone (PACERONE) 200 MG tablet Take 1 tablet (200 mg total) by mouth 2 (two) times daily. Start 08/06/16 - stop Tikosyn today 60 tablet 4  . apixaban  (ELIQUIS) 5 MG TABS tablet Take 1 tablet (5 mg total) by mouth 2 (two) times daily. 60 tablet 6  . Ascorbic Acid (VITAMIN C) 1000 MG tablet Take 1,000 mg by mouth daily.    [name] atorvastatin (LIPITOR) 40 MG tablet TAKE 1 TABLET BY MOUTH EVERY DAY 90 tablet 2  . Calcium Carbonate-Vitamin D (CALCIUM 600+D PO) Take 1 tablet by mouth daily.    . Cyanocobalamin (B-12) 5000 MCG CAPS Take 5,000 mcg by mouth at bedtime.     [name] diltiazem (CARDIZEM CD) 120 MG 24 hr capsule Take 1 capsule (120 mg total) by mouth daily. 30 capsule 11  . FLUoxetine (PROZAC) 20 MG capsule TAKE ONE CAPSULE BY MOUTH EVERY MORNING 90 capsule 1  . fluticasone (FLONASE) 50 MCG/ACT nasal spray Place 1 spray into both nostrils daily as needed for allergies or rhinitis.    . furosemide (LASIX) 40 MG tablet Take 80 mg by mouth in the AM and Take 40 mg by mouth in the PM 90 tablet 10  . Lysine HCl 1000 MG TABS Take 1,000 mg by mouth daily.     . metoprolol succinate (TOPROL-XL) 50 MG 24 hr tablet Take 1 tablet (50 mg total) by mouth 2 (two) times daily. Take with or immediately following a meal. 60 tablet 11  . Multiple Vitamin (MULTIVITAMIN WITH MINERALS) TABS tablet Take 1 tablet by mouth daily. Centrum    . nitroGLYCERIN (NITROSTAT) 0.4 MG SL tablet Place 1 tablet (0.4 mg total) under the tongue every 5 (five) minutes x 3 doses as needed for chest pain. 25 tablet 6  . Polyethyl Glycol-Propyl Glycol (SYSTANE OP) Place 1 drop into both eyes daily as needed (dry eyes).    . potassium chloride SA (KLOR-CON M20) 20 MEQ tablet Take 1 tablet (20 mEq total) by mouth daily. (Patient taking differently: Take 20 mEq by mouth 2 (two) times daily. ) 180 tablet 3  . PRESCRIPTION MEDICATION Inhale into the lungs at bedtime. CPAP    . vitamin E 1000 UNIT capsule Take 1,000 Units by mouth daily.     No current facility-administered medications for this encounter.     Allergies  Allergen Reactions  . Adhesive [Tape]     Allergic to EKG stickers and  defibrillation pads.    Social History   Social History  . Marital status: Married    Spouse name: N/A  . Number of children: 0  . Years of education: HS   Occupational History  . retired     previously worked [employer]  . Smoking status: Former Smoker    Packs/day: 1.00    Years: 5.00    Types: Cigarettes    Start date: 05/11/1978    Quit date: 07/03/1982  . Smokeless tobacco: Never Used  . Alcohol use Yes     Comment: once q 6 months-socailly-wine  . Drug use: No  . Sexual activity: Not Currently  Other Topics Concern  . 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   Married    Originally from [place] of two, high school education   Former smoker [phone] 1ppd   Hunting dogs 7    Retired from [employer] 2     Family History  Problem Relation Age of Onset  . Suicidality Father     suicide death pt was 3 yrs  . Arrhythmia Mother   . Hypertension Mother   . Diabetes Mother   . Heart attack Brother 71  . Heart disease Paternal Aunt   . Prostate cancer Maternal Grandfather   . Diabetes Paternal Grandfather     fathers side of the family    ROS- All systems are reviewed and negative except as per the HPI above  Physical Exam: Vitals:   10/23/16 0909  BP: 114/70  Pulse: 61  SpO2: 91%  Weight: 262 lb 3.2 oz (118.9 kg)  Height: 5' 6.5" (1.689 m)   Wt Readings from Last 3 Encounters:  10/23/16 262 lb 3.2 oz (118.9 kg)  10/17/16 263 lb 9.6 oz (119.6 kg)  10/16/16 265 lb (120.2 kg)    Labs: Lab Results  Component Value Date   NA 141 10/16/2016   K 4.0 10/16/2016   CL 96 10/16/2016   CO2 24 10/16/2016   GLUCOSE 112 (H) 10/16/2016   BUN 16 10/16/2016   CREATININE 1.08 (H) 10/16/2016   CALCIUM 9.2 10/16/2016   MG 2.3 07/28/2016   Lab Results  Component Value Date   INR 1.1 08/02/2016   Lab Results  Component Value  Date   CHOL 116 10/16/2016   HDL 42 10/16/2016   LDLCALC 46 10/16/2016   TRIG 139 10/16/2016     GEN- The patient is well appearing, alert and oriented x 3 today.   Head- normocephalic, atraumatic Eyes-  Sclera clear, conjunctiva pink Ears- hearing intact Oropharynx- clear Neck- supple, no JVP Lymph- no cervical lymphadenopathy Lungs- Clear to ausculation bilaterally, normal work of breathing Heart- Regular rate and rhythm, no murmurs, rubs or gallops, PMI not laterally displaced GI- soft, NT, ND, + BS Extremities- no clubbing, cyanosis, or edema MS- no significant deformity or atrophy Skin- no rash or lesion Psych- euthymic mood, full affect Neuro- strength and sensation are intact  EKG- SR  with first degree AV block, v rate 61 bpm, Pr int 210 ms, qrs int 74 ms, qtc 434 ms Epic records reviewed   Assessment and Plan: 1. Afib s/p ablation Maintaining SR Continue amiodarone 200 mg daily Continue eliquis without interruption Continue metoprolol and cardizem   F/u with Dr. Shellia Carwin  as scheduled 6/12  Afib clinic as needed  Butch Penny C. Analea Muller, [facility] [address] [place], [place] [address] [phone]

## 2016-10-25 ENCOUNTER — Other Ambulatory Visit: Payer: Self-pay | Admitting: Cardiovascular Disease

## 2016-11-03 ENCOUNTER — Ambulatory Visit (INDEPENDENT_AMBULATORY_CARE_PROVIDER_SITE_OTHER): Payer: PPO | Admitting: Internal Medicine

## 2016-11-03 DIAGNOSIS — R05 Cough: Secondary | ICD-10-CM

## 2016-11-03 DIAGNOSIS — R053 Chronic cough: Secondary | ICD-10-CM

## 2016-11-03 DIAGNOSIS — I5032 Chronic diastolic (congestive) heart failure: Secondary | ICD-10-CM

## 2016-11-03 DIAGNOSIS — R0600 Dyspnea, unspecified: Secondary | ICD-10-CM

## 2016-11-03 LAB — PULMONARY FUNCTION TEST
DL/VA % pred: 88 %
DL/VA: 4.52 ml/min/mmHg/L
DLCO cor % pred: 50 %
DLCO cor: 14.06 ml/min/mmHg
DLCO unc % pred: 53 %
DLCO unc: 14.71 ml/min/mmHg
FEF 25-75 Post: 2.79 L/sec
FEF 25-75 Pre: 1.24 L/sec
FEF2575-%Change-Post: 124 %
FEF2575-%Pred-Post: 138 %
FEF2575-%Pred-Pre: 61 %
FEV1-%Change-Post: 18 %
FEV1-%Pred-Post: 53 %
FEV1-%Pred-Pre: 44 %
FEV1-Post: 1.32 L
FEV1-Pre: 1.12 L
FEV1FVC-%Change-Post: 5 %
FEV1FVC-%Pred-Pre: 106 %
FEV6-%Change-Post: 12 %
FEV6-%Pred-Post: 49 %
FEV6-%Pred-Pre: 43 %
FEV6-Post: 1.55 L
FEV6-Pre: 1.38 L
FEV6FVC-%Pred-Post: 104 %
FEV6FVC-%Pred-Pre: 104 %
FVC-%Change-Post: 12 %
FVC-%Pred-Post: 47 %
FVC-%Pred-Pre: 41 %
FVC-Post: 1.55 L
FVC-Pre: 1.38 L
Post FEV1/FVC ratio: 85 %
Post FEV6/FVC ratio: 100 %
Pre FEV1/FVC ratio: 81 %
Pre FEV6/FVC Ratio: 100 %
RV % pred: 103 %
RV: 2.41 L
TLC % pred: 69 %
TLC: 3.76 L

## 2016-11-03 NOTE — Progress Notes (Signed)
PFT done today. 

## 2016-11-06 ENCOUNTER — Encounter: Payer: Self-pay | Admitting: Internal Medicine

## 2016-11-12 ENCOUNTER — Emergency Department (HOSPITAL_COMMUNITY): Payer: PPO

## 2016-11-12 ENCOUNTER — Inpatient Hospital Stay (HOSPITAL_COMMUNITY)
Admission: EM | Admit: 2016-11-12 | Discharge: 2016-11-15 | DRG: 291 | Disposition: A | Discharge status: Home / Self Care | Payer: PPO | Attending: Internal Medicine | Admitting: Internal Medicine

## 2016-11-12 ENCOUNTER — Encounter (HOSPITAL_COMMUNITY): Payer: Self-pay

## 2016-11-12 DIAGNOSIS — N183 Chronic kidney disease, stage 3 (moderate): Secondary | ICD-10-CM | POA: Diagnosis present

## 2016-11-12 DIAGNOSIS — M479 Spondylosis, unspecified: Secondary | ICD-10-CM | POA: Diagnosis not present

## 2016-11-12 DIAGNOSIS — Z87891 Personal history of nicotine dependence: Secondary | ICD-10-CM | POA: Diagnosis not present

## 2016-11-12 DIAGNOSIS — J9601 Acute respiratory failure with hypoxia: Secondary | ICD-10-CM | POA: Diagnosis not present

## 2016-11-12 DIAGNOSIS — Z9861 Coronary angioplasty status: Secondary | ICD-10-CM

## 2016-11-12 DIAGNOSIS — I482 Chronic atrial fibrillation, unspecified: Secondary | ICD-10-CM | POA: Diagnosis present

## 2016-11-12 DIAGNOSIS — I110 Hypertensive heart disease with heart failure: Secondary | ICD-10-CM | POA: Diagnosis not present

## 2016-11-12 DIAGNOSIS — E876 Hypokalemia: Secondary | ICD-10-CM | POA: Diagnosis not present

## 2016-11-12 DIAGNOSIS — Z7901 Long term (current) use of anticoagulants: Secondary | ICD-10-CM

## 2016-11-12 DIAGNOSIS — I272 Pulmonary hypertension, unspecified: Secondary | ICD-10-CM | POA: Diagnosis present

## 2016-11-12 DIAGNOSIS — I5033 Acute on chronic diastolic (congestive) heart failure: Secondary | ICD-10-CM | POA: Diagnosis present

## 2016-11-12 DIAGNOSIS — Z8673 Personal history of transient ischemic attack (TIA), and cerebral infarction without residual deficits: Secondary | ICD-10-CM

## 2016-11-12 DIAGNOSIS — I480 Paroxysmal atrial fibrillation: Secondary | ICD-10-CM | POA: Diagnosis not present

## 2016-11-12 DIAGNOSIS — R0602 Shortness of breath: Secondary | ICD-10-CM | POA: Diagnosis not present

## 2016-11-12 DIAGNOSIS — I252 Old myocardial infarction: Secondary | ICD-10-CM

## 2016-11-12 DIAGNOSIS — F329 Major depressive disorder, single episode, unspecified: Secondary | ICD-10-CM | POA: Diagnosis present

## 2016-11-12 DIAGNOSIS — E785 Hyperlipidemia, unspecified: Secondary | ICD-10-CM | POA: Diagnosis present

## 2016-11-12 DIAGNOSIS — Z91048 Other nonmedicinal substance allergy status: Secondary | ICD-10-CM

## 2016-11-12 DIAGNOSIS — E1122 Type 2 diabetes mellitus with diabetic chronic kidney disease: Secondary | ICD-10-CM | POA: Diagnosis present

## 2016-11-12 DIAGNOSIS — R05 Cough: Secondary | ICD-10-CM | POA: Diagnosis present

## 2016-11-12 DIAGNOSIS — I130 Hypertensive heart and chronic kidney disease with heart failure and stage 1 through stage 4 chronic kidney disease, or unspecified chronic kidney disease: Principal | ICD-10-CM | POA: Diagnosis present

## 2016-11-12 DIAGNOSIS — Z79899 Other long term (current) drug therapy: Secondary | ICD-10-CM

## 2016-11-12 DIAGNOSIS — Z950 Presence of cardiac pacemaker: Secondary | ICD-10-CM

## 2016-11-12 DIAGNOSIS — I4819 Other persistent atrial fibrillation: Secondary | ICD-10-CM | POA: Diagnosis present

## 2016-11-12 DIAGNOSIS — I440 Atrioventricular block, first degree: Secondary | ICD-10-CM | POA: Diagnosis not present

## 2016-11-12 DIAGNOSIS — M170 Bilateral primary osteoarthritis of knee: Secondary | ICD-10-CM | POA: Diagnosis present

## 2016-11-12 DIAGNOSIS — I10 Essential (primary) hypertension: Secondary | ICD-10-CM | POA: Diagnosis not present

## 2016-11-12 DIAGNOSIS — I700 Atherosclerosis of aorta: Secondary | ICD-10-CM | POA: Diagnosis not present

## 2016-11-12 DIAGNOSIS — I509 Heart failure, unspecified: Secondary | ICD-10-CM | POA: Diagnosis not present

## 2016-11-12 DIAGNOSIS — I081 Rheumatic disorders of both mitral and tricuspid valves: Secondary | ICD-10-CM | POA: Diagnosis present

## 2016-11-12 DIAGNOSIS — I2510 Atherosclerotic heart disease of native coronary artery without angina pectoris: Secondary | ICD-10-CM

## 2016-11-12 DIAGNOSIS — Z6841 Body Mass Index (BMI) 40.0 and over, adult: Secondary | ICD-10-CM

## 2016-11-12 DIAGNOSIS — R0902 Hypoxemia: Secondary | ICD-10-CM | POA: Diagnosis not present

## 2016-11-12 DIAGNOSIS — E119 Type 2 diabetes mellitus without complications: Secondary | ICD-10-CM

## 2016-11-12 DIAGNOSIS — F419 Anxiety disorder, unspecified: Secondary | ICD-10-CM | POA: Diagnosis not present

## 2016-11-12 DIAGNOSIS — D72829 Elevated white blood cell count, unspecified: Secondary | ICD-10-CM | POA: Diagnosis not present

## 2016-11-12 DIAGNOSIS — Z9989 Dependence on other enabling machines and devices: Secondary | ICD-10-CM

## 2016-11-12 DIAGNOSIS — G4733 Obstructive sleep apnea (adult) (pediatric): Secondary | ICD-10-CM | POA: Diagnosis present

## 2016-11-12 DIAGNOSIS — R059 Cough, unspecified: Secondary | ICD-10-CM

## 2016-11-12 DIAGNOSIS — Z955 Presence of coronary angioplasty implant and graft: Secondary | ICD-10-CM

## 2016-11-12 LAB — BRAIN NATRIURETIC PEPTIDE: B Natriuretic Peptide: 139 pg/mL — ABNORMAL HIGH (ref 0.0–100.0)

## 2016-11-12 LAB — BASIC METABOLIC PANEL
Anion gap: 12 (ref 5–15)
BUN: 17 mg/dL (ref 6–20)
CO2: 21 mmol/L — ABNORMAL LOW (ref 22–32)
Calcium: 8.6 mg/dL — ABNORMAL LOW (ref 8.9–10.3)
Chloride: 102 mmol/L (ref 101–111)
Creatinine, Ser: 1.04 mg/dL — ABNORMAL HIGH (ref 0.44–1.00)
GFR calc Af Amer: >60 mL/min (ref >60)
GFR calc non Af Amer: 53 mL/min — ABNORMAL LOW (ref >60)
Glucose, Bld: 201 mg/dL — ABNORMAL HIGH (ref 65–99)
Potassium: 3.8 mmol/L (ref 3.5–5.1)
Sodium: 135 mmol/L (ref 135–145)

## 2016-11-12 LAB — CBC
HCT: 38.4 % (ref 36.0–46.0)
Hemoglobin: 12.2 g/dL (ref 12.0–15.0)
MCH: 31.6 pg (ref 26.0–34.0)
MCHC: 31.8 g/dL (ref 30.0–36.0)
MCV: 99.5 fL (ref 78.0–100.0)
Platelets: 262 10*3/uL (ref 150–400)
RBC: 3.86 MIL/uL — ABNORMAL LOW (ref 3.87–5.11)
RDW: 14.8 % (ref 11.5–15.5)
WBC: 11.6 10*3/uL — ABNORMAL HIGH (ref 4.0–10.5)

## 2016-11-12 LAB — I-STAT TROPONIN, ED: Troponin i, poc: 0 ng/mL (ref 0.00–0.08)

## 2016-11-12 MED ORDER — ONDANSETRON HCL 4 MG/2ML IJ SOLN: 4.0000 mg | Freq: Four times a day (QID) | INTRAMUSCULAR | Status: DC | PRN

## 2016-11-12 MED ORDER — POTASSIUM CHLORIDE CRYS ER 20 MEQ PO TBCR
20.0000 meq | EXTENDED_RELEASE_TABLET | Freq: Two times a day (BID) | ORAL | Status: DC
  Administered 2016-11-13 – 2016-11-15 (×5): 20 meq via ORAL
  Filled 2016-11-12 (×5): qty 1

## 2016-11-12 MED ORDER — ADULT MULTIVITAMIN W/MINERALS CH
1.0000 | ORAL_TABLET | Freq: Every day | ORAL | Status: DC
  Administered 2016-11-13 – 2016-11-15 (×3): 1 via ORAL
  Filled 2016-11-12 (×3): qty 1

## 2016-11-12 MED ORDER — FUROSEMIDE 10 MG/ML IJ SOLN
120.0000 mg | Freq: Once | INTRAVENOUS | Status: AC
  Administered 2016-11-13: 120 mg via INTRAVENOUS
  Filled 2016-11-12: qty 12

## 2016-11-12 MED ORDER — METOPROLOL SUCCINATE ER 50 MG PO TB24
50.0000 mg | ORAL_TABLET | Freq: Two times a day (BID) | ORAL | Status: DC
  Administered 2016-11-13 – 2016-11-15 (×5): 50 mg via ORAL
  Filled 2016-11-12 (×5): qty 1

## 2016-11-12 MED ORDER — FUROSEMIDE 10 MG/ML IJ SOLN
60.0000 mg | Freq: Two times a day (BID) | INTRAMUSCULAR | Status: DC
  Administered 2016-11-13 (×2): 60 mg via INTRAVENOUS
  Filled 2016-11-12 (×2): qty 6

## 2016-11-12 MED ORDER — SODIUM CHLORIDE 0.9% FLUSH: 3.0000 mL | INTRAVENOUS | Status: DC | PRN

## 2016-11-12 MED ORDER — ACETAMINOPHEN 325 MG PO TABS: 650.0000 mg | ORAL_TABLET | ORAL | Status: DC | PRN

## 2016-11-12 MED ORDER — VITAMIN B-12 1000 MCG PO TABS
5000.0000 ug | ORAL_TABLET | Freq: Every day | ORAL | Status: DC
  Administered 2016-11-13 – 2016-11-14 (×2): 5000 ug via ORAL
  Filled 2016-11-12 (×2): qty 5

## 2016-11-12 MED ORDER — INSULIN ASPART 100 UNIT/ML ~~LOC~~ SOLN: 0.0000 [IU] | Freq: Every day | SUBCUTANEOUS | Status: DC

## 2016-11-12 MED ORDER — ZOLPIDEM TARTRATE 5 MG PO TABS: 5.0000 mg | ORAL_TABLET | Freq: Every evening | ORAL | Status: DC | PRN

## 2016-11-12 MED ORDER — INSULIN ASPART 100 UNIT/ML ~~LOC~~ SOLN
0.0000 [IU] | Freq: Three times a day (TID) | SUBCUTANEOUS | Status: DC
  Administered 2016-11-14 – 2016-11-15 (×4): 2 [IU] via SUBCUTANEOUS

## 2016-11-12 MED ORDER — FLUTICASONE PROPIONATE 50 MCG/ACT NA SUSP
1.0000 | Freq: Every day | NASAL | Status: DC | PRN
  Filled 2016-11-12: qty 16

## 2016-11-12 MED ORDER — ALPRAZOLAM 0.25 MG PO TABS: 0.2500 mg | ORAL_TABLET | Freq: Two times a day (BID) | ORAL | Status: DC | PRN

## 2016-11-12 MED ORDER — FLUOXETINE HCL 20 MG PO CAPS
20.0000 mg | ORAL_CAPSULE | Freq: Every morning | ORAL | Status: DC
  Administered 2016-11-13 – 2016-11-15 (×3): 20 mg via ORAL
  Filled 2016-11-12 (×3): qty 1

## 2016-11-12 MED ORDER — DILTIAZEM HCL ER COATED BEADS 120 MG PO CP24
120.0000 mg | ORAL_CAPSULE | Freq: Every day | ORAL | Status: DC
  Administered 2016-11-13 – 2016-11-15 (×3): 120 mg via ORAL
  Filled 2016-11-12 (×3): qty 1

## 2016-11-12 MED ORDER — VITAMIN E 45 MG (100 UNIT) PO CAPS
1000.0000 [IU] | ORAL_CAPSULE | Freq: Every day | ORAL | Status: DC
Start: 2016-11-13 — End: 2016-11-15
  Administered 2016-11-13 – 2016-11-15 (×3): 1000 [IU] via ORAL
  Filled 2016-11-12 (×3): qty 2

## 2016-11-12 MED ORDER — POLYVINYL ALCOHOL 1.4 % OP SOLN
Freq: Every day | OPHTHALMIC | Status: DC | PRN
  Filled 2016-11-12: qty 15

## 2016-11-12 MED ORDER — FUROSEMIDE 10 MG/ML IJ SOLN
80.0000 mg | Freq: Once | INTRAMUSCULAR | Status: DC
  Filled 2016-11-12: qty 8

## 2016-11-12 MED ORDER — APIXABAN 5 MG PO TABS
5.0000 mg | ORAL_TABLET | Freq: Two times a day (BID) | ORAL | Status: DC
  Administered 2016-11-13 – 2016-11-15 (×5): 5 mg via ORAL
  Filled 2016-11-12 (×5): qty 1

## 2016-11-12 MED ORDER — AMIODARONE HCL 200 MG PO TABS
200.0000 mg | ORAL_TABLET | Freq: Two times a day (BID) | ORAL | Status: DC
  Administered 2016-11-13 – 2016-11-15 (×5): 200 mg via ORAL
  Filled 2016-11-12 (×5): qty 1

## 2016-11-12 MED ORDER — SODIUM CHLORIDE 0.9% FLUSH
3.0000 mL | Freq: Two times a day (BID) | INTRAVENOUS | Status: DC
  Administered 2016-11-13 – 2016-11-15 (×5): 3 mL via INTRAVENOUS

## 2016-11-12 MED ORDER — LYSINE HCL 1000 MG PO TABS: 1000.0000 mg | ORAL_TABLET | Freq: Every day | ORAL | Status: DC

## 2016-11-12 MED ORDER — SODIUM CHLORIDE 0.9 % IV SOLN: 250.0000 mL | INTRAVENOUS | Status: DC | PRN

## 2016-11-12 MED ORDER — ATORVASTATIN CALCIUM 40 MG PO TABS
40.0000 mg | ORAL_TABLET | Freq: Every day | ORAL | Status: DC
  Administered 2016-11-13 – 2016-11-15 (×3): 40 mg via ORAL
  Filled 2016-11-12 (×3): qty 1

## 2016-11-12 NOTE — ED Notes (Signed)
Called lab to add on BNP lab test

## 2016-11-12 NOTE — ED Triage Notes (Signed)
Pt complaining of SOB and bilateral leg swelling x 4 days. Pt sats mid 80's on RA. Pt placed on 4L O2 at triage. Pt denis any chest pain. States hx of CHF. Pt states taking lasix as Rx'd. Pt states cough.

## 2016-11-12 NOTE — ED Provider Notes (Signed)
[facility] DEPT Provider Note   CSN: [id number] Arrival date & time: 11/12/16  2058     History   Chief Complaint Chief Complaint  Patient presents with  . Shortness of Breath  . Leg Swelling    HPI [name] is a 71 y.o. female.  Patient with history of atrial fibrillation on Eliquis, HTN, DM, CHF, CAD, pacemaker (tachy/brady syndrome), OSA, HLD presents with complaint of SOB that has been progressive x 4 days. She has a cough without fever. No chest pain, nausea, diaphoresis. She traveled to [place] and feels the heat and humidity are contributing factors. She reports bilateral LE edema that has also been progressive x 4 days.     Shortness of Breath  Associated symptoms include cough and leg swelling. Pertinent negatives include no fever and no chest pain.    Past Medical History:  Diagnosis Date  . Anticoagulant long-term use    pradaxa  . Anxiety   . Arthritis    low back & both knees, Spondylolisthesis    . CAD (coronary artery disease) 1497,0263   post PTCA with bare-metal stenting to mid RCA in December 2004     . CHF (congestive heart failure) ([place])   . Chronic atrial fibrillation ([place]) 06/2007   Tachybradycardia pacemaker  . Chronic kidney disease    10% function - ?R, other kidney is compensating    . Depression   . Diplopia 06/19/2008   Qualifier: Diagnosis of  By: [name] MD, [name]   . Dysrhythmia    ATRIAL FIBRILATION  . Edema of lower extremity   . History of acute inferior wall MI   . History of CVA (cerebrovascular accident)   . Hyperlipidemia   . Hypertension   . Myocardial infarction ([place]) [id number]  . OSA on CPAP    last test- 2010  . Pacemaker   . Pneumonia 2014   tx. ----  [facility]  . Shortness of breath   . Stroke ([place]) [id number]  . TIA (transient ischemic attack)   . Transfusion history   . Unspecified hemorrhoids without mention of complication 7/85/8850   Colonoscopy--Dr. Carlean Purl     Patient Active Problem List    Diagnosis Date Noted  . Atypical atrial flutter ([place]) 09/30/2016  . Hematoma 09/30/2016  . Hypotension 09/30/2016  . AF (atrial fibrillation) ([place]) 09/29/2016  . Hyperlipidemia   . CAD (coronary artery disease)   . Chronic atrial fibrillation ([place])   . HLD (hyperlipidemia)   . Controlled type 2 diabetes mellitus without complication ([place])   . Atrial fibrillation with RVR ([place]) 03/26/2016  . Acute respiratory failure with hypoxia ([place]) 03/26/2016  . DDD (degenerative disc disease), lumbar 04/05/2015  . Degenerative disc disease, lumbar 04/05/2015  . Severe obesity (BMI >= 40) ([place]) 02/16/2015  . Fatty liver disease, nonalcoholic 27/74/1287  . Cough, persistent 04/03/2014  . Acute exacerbation of CHF (congestive heart failure) ([place]) 03/21/2014  . CHF (congestive heart failure) ([place]) 03/21/2014  . Acute on chronic diastolic congestive heart failure ([place]) 10/07/2013  . Acute diastolic heart failure ([place]) 10/04/2013  . Pre-diabetes 09/24/2013  . Peripheral edema 08/05/2013  . Neoplasm of uncertain behavior of skin of back 11/09/2012  . Edema 11/04/2012  . Bleeding mole 11/04/2012  . Pulmonary hypertension ([place]) 06/11/2012  . Pneumonia 06/05/2012  . Hypoxia 06/05/2012  . Anemia 06/05/2012  . Hemorrhoids 05/11/2012  . Dyspepsia 05/11/2012  . Visit for preventive health examination 04/20/2011  . Positional vertigo 10/07/2010  . Anticoagulant long-term use   .  HERPES LABIALIS 12/30/2009  . INTERTRIGO 12/30/2009  . Obesity 06/02/2009  . Obstructive sleep apnea 10/12/2008  . HYPERLIPIDEMIA 09/15/2008  . SNORING 09/15/2008  . Essential hypertension 06/19/2008  . CAD S/P percutaneous coronary angioplasty 06/19/2008  . Paroxysmal atrial fibrillation ([place]) 06/19/2008  . BRADYCARDIA-TACHYCARDIA SYNDROME 06/19/2008  . CEREBRAL ANEURYSM 06/19/2008  . ALLERGIC RHINITIS 06/19/2008  . HYPERGLYCEMIA 06/19/2008  . DEPRESSION, HX OF 06/19/2008  . CEREBROVASCULAR ACCIDENT, HX OF 06/19/2008   . PACEMAKER, PERMANENT 06/19/2008    Past Surgical History:  Procedure Laterality Date  . APPENDECTOMY    . ATRIAL FIBRILLATION ABLATION N/A 09/29/2016   Procedure: Atrial Fibrillation Ablation;  Surgeon: Will Meredith Leeds, MD;  Location: [facility] CV LAB;  Service: Cardiovascular;  Laterality: N/A;  . back surgeries     x2-3 by Dr Trenton Gammon  . BACK SURGERY    . CHOLECYSTECTOMY  1994  . CORONARY ANGIOPLASTY WITH STENT PLACEMENT  1998  . CORONARY STENT PLACEMENT     C stent  . DOPPLER ECHOCARDIOGRAPHY  2009  . ELECTROPHYSIOLOGIC STUDY N/A 03/31/2016   Procedure: Cardioversion;  Surgeon: Evans Lance, MD;  Location: [facility] LAB;  Service: Cardiovascular;  Laterality: N/A;  . ELECTROPHYSIOLOGIC STUDY N/A 08/04/2016   Procedure: Cardioversion;  Surgeon: Evans Lance, MD;  Location: [facility] LAB;  Service: Cardiovascular;  Laterality: N/A;  . LEFT AND RIGHT HEART CATHETERIZATION WITH CORONARY ANGIOGRAM N/A 10/06/2013   Procedure: LEFT AND RIGHT HEART CATHETERIZATION WITH CORONARY ANGIOGRAM;  Surgeon: Troy Sine, MD;  Location: [facility] CATH LAB;  Service: Cardiovascular;  Laterality: N/A;  . permanent pacemaker  06/2007  . TEE WITHOUT CARDIOVERSION N/A 09/29/2016   Procedure: TRANSESOPHAGEAL ECHOCARDIOGRAM (TEE);  Surgeon: Jerline Pain, MD;  Location: [facility] ENDOSCOPY;  Service: Cardiovascular;  Laterality: N/A;  . TOTAL ABDOMINAL HYSTERECTOMY  1984   2 surgeries endometriosis bso    OB History    No data available       Home Medications    Prior to Admission medications   Medication Sig Start Date End Date Taking? Authorizing Provider  acetaminophen (TYLENOL) 325 MG tablet Take 2 tablets (650 mg total) by mouth every 4 (four) hours as needed for headache or mild pain. 10/01/16   Erlene Quan, PA-C  amiodarone (PACERONE) 200 MG tablet Take 1 tablet (200 mg total) by mouth 2 (two) times daily. Start 08/06/16 - stop Tikosyn today 08/04/16   Chanetta Marshall K, NP  apixaban (ELIQUIS)  5 MG TABS tablet Take 1 tablet (5 mg total) by mouth 2 (two) times daily. 08/07/16   Evans Lance, MD  Ascorbic Acid (VITAMIN C) 1000 MG tablet Take 1,000 mg by mouth daily.    [provider]  atorvastatin (LIPITOR) 40 MG tablet TAKE 1 TABLET BY MOUTH EVERY DAY 03/14/16   Nahser, Wonda Cheng, MD  Calcium Carbonate-Vitamin D (CALCIUM 600+D PO) Take 1 tablet by mouth daily.    [provider]  Cyanocobalamin (B-12) 5000 MCG CAPS Take 5,000 mcg by mouth at bedtime.     [provider]  diltiazem (CARDIZEM CD) 120 MG 24 hr capsule Take 1 capsule (120 mg total) by mouth daily. 10/02/16   Erlene Quan, PA-C  FLUoxetine (PROZAC) 20 MG capsule TAKE ONE CAPSULE BY MOUTH EVERY MORNING 06/30/16   [name], [name], MD  fluticasone (FLONASE) 50 MCG/ACT nasal spray Place 1 spray into both nostrils daily as needed for allergies or rhinitis.    [provider]  furosemide (LASIX) 40 MG tablet TAKE 2 TABLETS (80MG ) BY MOUTH IN THE AM AND 1 TABLET (40MG ) BY MOUTH IN THE PM 10/25/16   Nahser, Wonda Cheng, MD  Lysine HCl 1000 MG TABS Take 1,000 mg by mouth daily.     [provider]  metoprolol succinate (TOPROL-XL) 50 MG 24 hr tablet Take 1 tablet (50 mg total) by mouth 2 (two) times daily. Take with or immediately following a meal. 10/01/16   Kilroy, Doreene Burke, PA-C  Multiple Vitamin (MULTIVITAMIN WITH MINERALS) TABS tablet Take 1 tablet by mouth daily. Centrum    [provider]  nitroGLYCERIN (NITROSTAT) 0.4 MG SL tablet Place 1 tablet (0.4 mg total) under the tongue every 5 (five) minutes x 3 doses as needed for chest pain. 07/28/16   Nahser, Wonda Cheng, MD  Polyethyl Glycol-Propyl Glycol (SYSTANE OP) Place 1 drop into both eyes daily as needed (dry eyes).    [provider]  potassium chloride SA (KLOR-CON M20) 20 MEQ tablet Take 1 tablet (20 mEq total) by mouth daily. Patient taking differently: Take 20 mEq by mouth 2 (two) times daily.  10/02/16 12/31/16  Constance Haw, MD  PRESCRIPTION MEDICATION Inhale into the lungs at bedtime. CPAP    [provider]  vitamin E 1000 UNIT capsule Take 1,000 Units by mouth daily.    [provider]    Family History Family History  Problem Relation Age of Onset  . Suicidality Father        suicide death pt was 3 yrs  . Arrhythmia Mother   . Hypertension Mother   . Diabetes Mother   . Heart attack Brother 44  . Heart disease Paternal Aunt   . Prostate cancer Maternal Grandfather   . Diabetes Paternal Grandfather        fathers side of the family    Social History Social History  Substance Use Topics  . Smoking status: Former Smoker    Packs/day: 1.00    Years: 5.00    Types: Cigarettes    Start date: 05/11/1978    Quit date: 07/03/1982  . Smokeless tobacco: Never Used  . Alcohol use Yes     Comment: once q 6 months-socailly-wine     Allergies   Adhesive [tape]   Review of Systems Review of Systems  Constitutional: Negative for chills and fever.  HENT: Negative.  Negative for congestion.   Respiratory: Positive for cough and shortness of breath.   Cardiovascular: Positive for leg swelling. Negative for chest pain.  Gastrointestinal: Negative.   Musculoskeletal: Negative.  Negative for myalgias.  Skin: Negative.   Neurological: Negative.      Physical Exam Updated Vital Signs BP 137/71 (BP Location: Right Arm)   Pulse 82   Temp 99.6 F (37.6 C) (Oral)   Resp (!) 24   SpO2 97%   Physical Exam  Constitutional: She is oriented to person, place, and time. She appears well-developed and well-nourished.  HENT:  Head: Normocephalic.  Neck: Normal range of motion. Neck supple.  Cardiovascular: Normal rate and regular rhythm.   No murmur heard. Pulmonary/Chest: Effort normal. No respiratory distress. She has rales. She exhibits no tenderness.  Abdominal: Soft. Bowel sounds are normal. There is no tenderness. There is no rebound and no guarding.  Musculoskeletal:  Normal range of motion. She exhibits edema.  2-3+ pitting edema bilateral LE's.  Neurological: She is alert and oriented to person, place, and time.  Skin: Skin is warm and dry. No  rash noted.  Psychiatric: She has a normal mood and affect.     ED Treatments / Results  Labs (all labs ordered are listed, but only abnormal results are displayed) Labs Reviewed  BASIC METABOLIC PANEL - Abnormal; Notable for the following:       Result Value   CO2 21 (*)    Glucose, Bld 201 (*)    Creatinine, Ser 1.04 (*)    Calcium 8.6 (*)    GFR calc non Af Amer 53 (*)    All other components within normal limits  CBC - Abnormal; Notable for the following:    WBC 11.6 (*)    RBC 3.86 (*)    All other components within normal limits  BRAIN NATRIURETIC PEPTIDE  I-STAT TROPOININ, ED   Results for orders placed or performed during the hospital encounter of 09/98/33  Basic metabolic panel  Result Value Ref Range   Sodium 135 135 - 145 mmol/L   Potassium 3.8 3.5 - 5.1 mmol/L   Chloride 102 101 - 111 mmol/L   CO2 21 (L) 22 - 32 mmol/L   Glucose, Bld 201 (H) 65 - 99 mg/dL   BUN 17 6 - 20 mg/dL   Creatinine, Ser 1.04 (H) 0.44 - 1.00 mg/dL   Calcium 8.6 (L) 8.9 - 10.3 mg/dL   GFR calc non Af Amer 53 (L) >60 mL/min   GFR calc Af Amer >60 >60 mL/min   Anion gap 12 5 - 15  CBC  Result Value Ref Range   WBC 11.6 (H) 4.0 - 10.5 K/uL   RBC 3.86 (L) 3.87 - 5.11 MIL/uL   Hemoglobin 12.2 12.0 - 15.0 g/dL   HCT 38.4 36.0 - 46.0 %   MCV 99.5 78.0 - 100.0 fL   MCH 31.6 26.0 - 34.0 pg   MCHC 31.8 30.0 - 36.0 g/dL   RDW 14.8 11.5 - 15.5 %   Platelets 262 150 - 400 K/uL  I-stat troponin, ED  Result Value Ref Range   Troponin i, poc 0.00 0.00 - 0.08 ng/mL   Comment 3            EKG  EKG Interpretation  Date/Time:  Sunday Nov 12 2016 21:14:58 EDT Ventricular Rate:  78 PR Interval:  222 QRS Duration: 84 QT Interval:  380 QTC Calculation: 433 R Axis:   81 Text Interpretation:  Sinus rhythm with  1st degree A-V block Otherwise normal ECG When compared with ECG of 10/23/2016, No significant change was found Confirmed by Delora Fuel ([id number]) on 11/12/2016 10:48:35 PM       Radiology Dg Chest 2 View  Result Date: 11/12/2016 CLINICAL DATA:  Acute shortness of breath. EXAM: CHEST  2 VIEW COMPARISON:  09/03/2016 and prior chest radiographs FINDINGS: Cardiomegaly and left-sided pacemaker again noted. New mild to moderate interstitial pulmonary edema identified. No pleural effusion or pneumothorax noted. No acute bony abnormalities are present. Remote bilateral rib fractures are noted. IMPRESSION: Cardiomegaly with mild to moderate interstitial pulmonary edema. Electronically Signed   By: Margarette Canada M.D.   On: 11/12/2016 22:37    Procedures Procedures (including critical care time)  Medications Ordered in ED Medications  furosemide (LASIX) injection 80 mg (not administered)     Initial Impression / Assessment and Plan / ED Course  I have reviewed the triage vital signs and the nursing notes.  Pertinent labs & imaging results that were available during my care of the patient were reviewed by me and considered in my  medical decision making (see chart for details).     Patient presents with progressive SOB w/o CP, and bilateral LE edema x 4 days found to be in acute CHF. O2 saturations in the mid-80's on arrival, improved to low 90's on 4L.   IV lasix ordered. The patient is comfortable. EKG unchanged. She is in no pain.   Discussed with Dr. Myna Hidalgo with [facility] who accepts the patient for admission.  Final Clinical Impressions(s) / ED Diagnoses   Final diagnoses:  None   1. Acute CHF 2. Hypoxia  New Prescriptions New Prescriptions   No medications on file     Dennie Bible 11/12/16 2320    Milton Ferguson, MD 11/13/16 [phone]

## 2016-11-12 NOTE — H&P (Signed)
History and Physical    [name] VFI:[id number] DOB: [birth year] DOA: 11/12/2016  PCP: [name], MD   Patient coming from: Home  Chief Complaint: Progressive leg edema and dyspnea  HPI: [name] is a 71 y.o. female with medical history significant for hypertension, depression, paroxysmal atrial fibrillation, coronary artery disease with stent, OSA on CPAP, history of CVA, and chronic diastolic CHF who presents to the emergency department with 4 days of progressive dyspnea and swelling. She had been taking increased dose of Lasix last couple months and had been is stable condition in terms of respiratory status and swelling until 4 days ago while visiting family out of state and eating without discretion. Over the past 4 days, she has noted increasing SOB, worse with exertion or lying back, as well as progressive BLE swelling. Her husband has been encouraging her to seek evaluation in ED, but patient had been hesitant until tonight when she was having trouble speaking due to dyspnea. She denies any fevers, chills, chest pain, or palpitations. Mild non-productive cough reported. EMS was called out and found patient to be in distress and saturating 80% on room air. She has never required home O2.    ED Course: Upon arrival to the ED, patient is found to be afebrile, requiring 4 Lpm supplemental oxygen, mildly tachypnea, and normal BP. EKG features a sinus rhythm with first degree AV nodal block. Chest x-ray features cardiomegaly and mild-moderate interstitial edema. Chemistry panel features a creatinine of 1.04, consistent with baseline, and serum glucose of 204. CBC features a mild leukocytosis to 11,600. Troponin is undetectable. Ms. Khouri was treated with 120 mg IV Lasix in ED. She will be admitted to the telemetry unit for ongoing evaluation and treatment of dyspnea and edema secondary to acute on chronic diastolic CHF.  Review of Systems:  All other systems reviewed and apart from HPI,  are negative.  Past Medical History:  Diagnosis Date  . Anticoagulant long-term use    pradaxa  . Anxiety   . Arthritis    low back & both knees, Spondylolisthesis    . CAD (coronary artery disease) 4166,0630   post PTCA with bare-metal stenting to mid RCA in December 2004     . CHF (congestive heart failure) ([place])   . Chronic atrial fibrillation ([place]) 06/2007   Tachybradycardia pacemaker  . Chronic kidney disease    10% function - ?R, other kidney is compensating    . Depression   . Diplopia 06/19/2008   Qualifier: Diagnosis of  By: [name] MD, [name]   . Dysrhythmia    ATRIAL FIBRILATION  . Edema of lower extremity   . History of acute inferior wall MI   . History of CVA (cerebrovascular accident)   . Hyperlipidemia   . Hypertension   . Myocardial infarction ([place]) [id number]  . OSA on CPAP    last test- 2010  . Pacemaker   . Pneumonia 2014   tx. ----  [facility]  . Shortness of breath   . Stroke ([place]) [id number]  . TIA (transient ischemic attack)   . Transfusion history   . Unspecified hemorrhoids without mention of complication 1/60/1093   Colonoscopy--Dr. Carlean Purl     Past Surgical History:  Procedure Laterality Date  . APPENDECTOMY    . ATRIAL FIBRILLATION ABLATION N/A 09/29/2016   Procedure: Atrial Fibrillation Ablation;  Surgeon: Will Meredith Leeds, MD;  Location: [place] [facility];  Service: Cardiovascular;  Laterality: N/A;  . back surgeries  x2-3 by Dr Trenton Gammon  . BACK SURGERY    . CHOLECYSTECTOMY  1994  . CORONARY ANGIOPLASTY WITH STENT PLACEMENT  1998  . CORONARY STENT PLACEMENT     C stent  . DOPPLER ECHOCARDIOGRAPHY  2009  . ELECTROPHYSIOLOGIC STUDY N/A 03/31/2016   Procedure: Cardioversion;  Surgeon: Evans Lance, MD;  Location: [place] [facility];  Service: Cardiovascular;  Laterality: N/A;  . ELECTROPHYSIOLOGIC STUDY N/A 08/04/2016   Procedure: Cardioversion;  Surgeon: Evans Lance, MD;  Location: [place] [facility];  Service:  Cardiovascular;  Laterality: N/A;  . LEFT AND RIGHT HEART CATHETERIZATION WITH CORONARY ANGIOGRAM N/A 10/06/2013   Procedure: LEFT AND RIGHT HEART CATHETERIZATION WITH CORONARY ANGIOGRAM;  Surgeon: Troy Sine, MD;  Location: [facility];  Service: Cardiovascular;  Laterality: N/A;  . permanent pacemaker  06/2007  . TEE WITHOUT CARDIOVERSION N/A 09/29/2016   Procedure: TRANSESOPHAGEAL ECHOCARDIOGRAM (TEE);  Surgeon: Jerline Pain, MD;  Location: [facility] ENDOSCOPY;  Service: Cardiovascular;  Laterality: N/A;  . TOTAL ABDOMINAL HYSTERECTOMY  1984   2 surgeries endometriosis bso     reports that she quit smoking about 34 years ago. Her smoking use included Cigarettes. She started smoking about 38 years ago. She has a 5.00 pack-year smoking history. She has never used smokeless tobacco. She reports that she drinks alcohol. She reports that she does not use drugs.  Allergies  Allergen Reactions  . Adhesive [Tape]     Allergic to EKG stickers and defibrillation pads.    Family History  Problem Relation Age of Onset  . Suicidality Father        suicide death pt was 3 yrs  . Arrhythmia Mother   . Hypertension Mother   . Diabetes Mother   . Heart attack Brother 59  . Heart disease Paternal Aunt   . Prostate cancer Maternal Grandfather   . Diabetes Paternal Grandfather        fathers side of the family     Prior to Admission medications   Medication Sig Start Date End Date Taking? Authorizing Provider  acetaminophen (TYLENOL) 325 MG tablet Take 2 tablets (650 mg total) by mouth every 4 (four) hours as needed for headache or mild pain. Patient taking differently: Take 650 mg by mouth every morning.  10/01/16  Yes Kilroy, Doreene Burke, PA-C  amiodarone (PACERONE) 200 MG tablet Take 1 tablet (200 mg total) by mouth 2 (two) times daily. Start 08/06/16 - stop Tikosyn today 08/04/16  Yes Seiler, [employer] K, NP  apixaban (ELIQUIS) 5 MG TABS tablet Take 1 tablet (5 mg total) by mouth 2 (two) times daily. 08/07/16  Yes  Evans Lance, MD  Ascorbic Acid (VITAMIN C) 1000 MG tablet Take 1,000 mg by mouth daily.   Yes [provider]  atorvastatin (LIPITOR) 40 MG tablet TAKE 1 TABLET BY MOUTH EVERY DAY 03/14/16  Yes Nahser, Wonda Cheng, MD  Calcium Carbonate-Vitamin D (CALCIUM 600+D PO) Take 1 tablet by mouth daily.   Yes [provider]  Cyanocobalamin (B-12) 5000 MCG CAPS Take 5,000 mcg by mouth at bedtime.    Yes [provider]  diltiazem (CARDIZEM CD) 120 MG 24 hr capsule Take 1 capsule (120 mg total) by mouth daily. 10/02/16  Yes Kilroy, Luke K, PA-C  diphenhydramine-acetaminophen (TYLENOL PM) 25-500 MG TABS tablet Take 2 tablets by mouth at bedtime as needed (pain/sleep).   Yes [provider]  FLUoxetine (PROZAC) 20 MG capsule TAKE ONE CAPSULE BY MOUTH EVERY MORNING 06/30/16  Yes [name], [name], MD  fluticasone (FLONASE) 50 MCG/ACT nasal spray Place 1 spray into both nostrils daily as needed for allergies or rhinitis.   Yes [provider]  furosemide (LASIX) 40 MG tablet TAKE 2 TABLETS (80MG ) BY MOUTH IN THE AM AND 1 TABLET (40MG ) BY MOUTH IN THE PM 10/25/16  Yes Nahser, Wonda Cheng, MD  Lysine HCl 1000 MG TABS Take 1,000 mg by mouth daily.    Yes [provider]  metoprolol succinate (TOPROL-XL) 50 MG 24 hr tablet Take 1 tablet (50 mg total) by mouth 2 (two) times daily. Take with or immediately following a meal. 10/01/16  Yes Kilroy, Doreene Burke, PA-C  Multiple Vitamin (MULTIVITAMIN WITH MINERALS) TABS tablet Take 1 tablet by mouth daily. Centrum   Yes [provider]  nitroGLYCERIN (NITROSTAT) 0.4 MG SL tablet Place 1 tablet (0.4 mg total) under the tongue every 5 (five) minutes x 3 doses as needed for chest pain. 07/28/16  Yes Nahser, Wonda Cheng, MD  Polyethyl Glycol-Propyl Glycol (SYSTANE OP) Place 1 drop into both eyes daily as needed (dry eyes).   Yes [provider]  potassium chloride SA (KLOR-CON M20) 20 MEQ tablet Take 1 tablet (20 mEq total) by  mouth daily. Patient taking differently: Take 20 mEq by mouth 2 (two) times daily.  10/02/16 12/31/16 Yes Camnitz, Ocie Doyne, MD  PRESCRIPTION MEDICATION Inhale into the lungs at bedtime. CPAP   Yes [provider]  vitamin E 1000 UNIT capsule Take 1,000 Units by mouth daily.   Yes [provider]    Physical Exam: Vitals:   11/12/16 2118 11/12/16 2230 11/12/16 2245  BP: 137/71 (!) 116/56 130/60  Pulse: 82 69 66  Resp: (!) 24 19 (!) 28  Temp: 99.6 F (37.6 C)    TempSrc: Oral    SpO2: 97% 92% 93%      Constitutional: Not in acute distress, but dyspneic with speech. No pallor or diaphoresis. Obese.  Eyes: PERTLA, lids and conjunctivae normal ENMT: Mucous membranes are moist. Posterior pharynx clear of any exudate or lesions.   Neck: normal, supple, no masses, no thyromegaly Respiratory: Mild tachypnea, dyspnea with speech, crackles bilaterally. No accessory muscle use.  Cardiovascular: S1 & S2 heard, regular rate and rhythm. Bilateral leg edema into thighs. JVP poorly-visualized. Abdomen: No distension, no tenderness, no masses palpated. Bowel sounds normal.  Musculoskeletal: no clubbing / cyanosis. No joint deformity upper and lower extremities.   Skin: no significant rashes, lesions, ulcers. Warm, dry, well-perfused. Neurologic: CN 2-12 grossly intact. Sensation intact, DTR normal. Strength 5/5 in all 4 limbs.  Psychiatric: Alert and oriented x 3. Calm and cooperative.     Labs on Admission: I have personally reviewed following labs and imaging studies  CBC:  Recent Labs Lab 11/12/16 2125  WBC 11.6*  HGB 12.2  HCT 38.4  MCV 99.5  PLT 440   Basic Metabolic Panel:  Recent Labs Lab 11/12/16 2125  NA 135  K 3.8  CL 102  CO2 21*  GLUCOSE 201*  BUN 17  CREATININE 1.04*  CALCIUM 8.6*   GFR: CrCl cannot be calculated (Unknown ideal weight.). Liver Function Tests: No results for input(s): AST, ALT, ALKPHOS, BILITOT, PROT, ALBUMIN in the last 168  hours. No results for input(s): LIPASE, AMYLASE in the last 168 hours. No results for input(s): AMMONIA in the last 168 hours. Coagulation Profile: No results for input(s): INR, PROTIME in the last 168 hours. Cardiac Enzymes: No results for input(s): CKTOTAL, CKMB,  CKMBINDEX, TROPONINI in the last 168 hours. BNP (last 3 results) No results for input(s): PROBNP in the last 8760 hours. HbA1C: No results for input(s): HGBA1C in the last 72 hours. CBG: No results for input(s): GLUCAP in the last 168 hours. Lipid Profile: No results for input(s): CHOL, HDL, LDLCALC, TRIG, CHOLHDL, LDLDIRECT in the last 72 hours. Thyroid Function Tests: No results for input(s): TSH, T4TOTAL, FREET4, T3FREE, THYROIDAB in the last 72 hours. Anemia Panel: No results for input(s): VITAMINB12, FOLATE, FERRITIN, TIBC, IRON, RETICCTPCT in the last 72 hours. Urine analysis:    Component Value Date/Time   COLORURINE YELLOW 03/28/2016 1050   APPEARANCEUR CLEAR 03/28/2016 1050   LABSPEC 1.015 03/28/2016 1050   PHURINE 6.5 03/28/2016 1050   GLUCOSEU NEGATIVE 03/28/2016 1050   HGBUR NEGATIVE 03/28/2016 1050   HGBUR negative 06/01/2010 0841   BILIRUBINUR NEGATIVE 03/28/2016 1050   BILIRUBINUR n 11/04/2012 1057   KETONESUR NEGATIVE 03/28/2016 1050   PROTEINUR NEGATIVE 03/28/2016 1050   UROBILINOGEN 0.2 11/04/2012 1057   UROBILINOGEN 0.2 06/01/2010 0841   NITRITE NEGATIVE 03/28/2016 1050   LEUKOCYTESUR NEGATIVE 03/28/2016 1050   Sepsis Labs: @LABRCNTIP (procalcitonin:4,lacticidven:4) )No results found for this or any previous visit (from the past 240 hour(s)).   Radiological Exams on Admission: Dg Chest 2 View  Result Date: 11/12/2016 CLINICAL DATA:  Acute shortness of breath. EXAM: CHEST  2 VIEW COMPARISON:  09/03/2016 and prior chest radiographs FINDINGS: Cardiomegaly and left-sided pacemaker again noted. New mild to moderate interstitial pulmonary edema identified. No pleural effusion or pneumothorax noted.  No acute bony abnormalities are present. Remote bilateral rib fractures are noted. IMPRESSION: Cardiomegaly with mild to moderate interstitial pulmonary edema. Electronically Signed   By: Margarette Canada M.D.   On: 11/12/2016 22:37    EKG: Independently reviewed. Sinus rhythm, 1st degree AV block.  Assessment/Plan  1. Acute on chronic diastolic CHF; acute hypoxic respiratory failure   - Presents with 4 days of progressive SOB and BLE swelling in setting of dietary indiscretion  - Saturating 80% on rm air for EMS, requiring 4 Lpm supplemental O2 in ED  - No fever, only trace leukocytosis, noted to be edematous with crackles, CXR with IS edema  - TTE (10/16/16) with EF 76-28%, grade 2 diastolic, moderate LAE, mild MR, mild TR, and moderate pulmonary HTN  - Managed at home with Lasix 80 mg qAM and 40 mg qPM, metoprolol  - She was given 120 mg IV Lasix in ED - Plan to SLIV, fluid-restrict diet, follow daily wts and I/O's  - Continue diuresis with Lasix 60 mg IV q12h, adjusted as needed  - Continue beta-blocker as tolerated   2. Paroxysmal atrial fibrillation - In a sinus rhythm on admission  - Underwent ablation a couple months ago  - CHADS-VASc is 19 (age, gender, CHF, CAD, HTN, DM) - Continue Eliquis, amiodarone, telemetry monitoring during IV diuresis  3. CAD - No anginal complaints, EKG without acute ischemic features, troponin undetectable  - Hx of angioplasty and stent to RCA  - Continue Lipitor, Toprol  4. Type II DM  - A1c was 6.9% in January 2018 - Serum glucose 204 on admission  - Has been attempting diet-control  - Check CBG with meals and qHS  - Start a low-intensity SSI    5. Hypertension  - BP at goal  - Continue Toprol and diltiazem as tolerated   6. OSA - Tolerating CPAP at home, will continue qHS    DVT prophylaxis: Eliquis Code Status: Full  Family Communication: Husband updated at bedside Disposition Plan: Admit to telemetry Consults called: None Admission  status: Inpatient    Vianne Bulls, MD [facility] Pager [phone]  If 7PM-7AM, please contact night-coverage [email or web address] Password [facility]  11/12/2016, 11:52 PM

## 2016-11-13 ENCOUNTER — Encounter (HOSPITAL_COMMUNITY): Payer: Self-pay | Admitting: *Deleted

## 2016-11-13 LAB — MAGNESIUM: Magnesium: 2.1 mg/dL (ref 1.7–2.4)

## 2016-11-13 LAB — GLUCOSE, CAPILLARY
Glucose-Capillary: 125 mg/dL — ABNORMAL HIGH (ref 65–99)
Glucose-Capillary: 141 mg/dL — ABNORMAL HIGH (ref 65–99)
Glucose-Capillary: 99 mg/dL (ref 65–99)
Glucose-Capillary: 111 mg/dL — ABNORMAL HIGH (ref 65–99)
Glucose-Capillary: 129 mg/dL — ABNORMAL HIGH (ref 65–99)

## 2016-11-13 LAB — BASIC METABOLIC PANEL
Anion gap: 15 (ref 5–15)
BUN: 16 mg/dL (ref 6–20)
CO2: 26 mmol/L (ref 22–32)
Calcium: 8.6 mg/dL — ABNORMAL LOW (ref 8.9–10.3)
Chloride: 99 mmol/L — ABNORMAL LOW (ref 101–111)
Creatinine, Ser: 1 mg/dL (ref 0.44–1.00)
GFR calc Af Amer: >60 mL/min (ref >60)
GFR calc non Af Amer: 56 mL/min — ABNORMAL LOW (ref >60)
Glucose, Bld: 103 mg/dL — ABNORMAL HIGH (ref 65–99)
Potassium: 3.1 mmol/L — ABNORMAL LOW (ref 3.5–5.1)
Sodium: 140 mmol/L (ref 135–145)

## 2016-11-13 MED ORDER — POTASSIUM CHLORIDE CRYS ER 20 MEQ PO TBCR
40.0000 meq | EXTENDED_RELEASE_TABLET | Freq: Once | ORAL | Status: AC
  Administered 2016-11-13: 40 meq via ORAL
  Filled 2016-11-13: qty 2

## 2016-11-13 NOTE — Progress Notes (Signed)
New Admission Note:   Arrival Method:  Ambulated from stretcher to bed Mental Orientation:  A & O x 4 Telemetry: Placed on Tele Box #22 Assessment: Completed Skin:  Dry but otherwise intact IV:  Right FA #22 gauge Pain:   Denies Tubes:  None Safety Measures: Safety Fall Prevention Plan has been given, discussed and signed Admission: Completed [address] [place] Orientation: Patient has been orientated to the room, unit and staff.  Family:  From home with husband of 96 years  Orders have been reviewed and implemented. Will continue to monitor the patient. Call light has been placed within reach and bed alarm has been activated.   Earleen Reaper RN- [place], [place] Phone number: [phone]

## 2016-11-13 NOTE — Plan of Care (Signed)
Problem: Activity: Goal: Risk for activity intolerance will decrease Outcome: Progressing Pt up to bedside commode with standby assist.

## 2016-11-13 NOTE — Progress Notes (Signed)
PROGRESS NOTE    [name]  ZOX:[id number] DOB: 06/26/1946 DOA: 11/12/2016 PCP: [name], MD   Outpatient Specialists:     Brief Narrative:  [name] is a 71 y.o. female with medical history significant for hypertension, depression, paroxysmal atrial fibrillation, coronary artery disease with stent, OSA on CPAP, history of CVA, and chronic diastolic CHF who presents to the emergency department with 4 days of progressive dyspnea and swelling. She had been taking increased dose of Lasix last couple months and had been is stable condition in terms of respiratory status and swelling until 4 days ago while visiting family out of state and eating without discretion. Over the past 4 days, she has noted increasing SOB, worse with exertion or lying back, as well as progressive BLE swelling. Her husband has been encouraging her to seek evaluation in ED, but patient had been hesitant until tonight when she was having trouble speaking due to dyspnea. She denies any fevers, chills, chest pain, or palpitations. Mild non-productive cough reported. EMS was called out and found patient to be in distress and saturating 80% on room air. She has never required home O2.     Assessment & Plan:   Principal Problem:   Acute on chronic diastolic congestive heart failure (HCC) Active Problems:   Obstructive sleep apnea   Essential hypertension   CAD S/P percutaneous coronary angioplasty   Paroxysmal atrial fibrillation (HCC)   Acute respiratory failure with hypoxia (HCC)   Controlled type 2 diabetes mellitus without complication (HCC)   Acute on chronic diastolic CHF; acute hypoxic respiratory failure   -  4 days of progressive SOB and BLE swelling in setting of dietary indiscretion and not taking diuretics as she was travelling - Saturating 80% on rm air for EMS, requiring 4 Lpm supplemental O2 in ED  -noted to be edematous with crackles, CXR with IS edema  - TTE (10/16/16) with EF 55-60%, grade 2  diastolic, moderate LAE, mild MR, mild TR, and moderate pulmonary HTN  - Managed at home with Lasix 80 mg qAM and 40 mg qPM, metoprolol  - She was given 120 mg IV Lasix in ED - Plan to SLIV, fluid-restrict diet, follow daily wts and I/O's  - Continue diuresis with Lasix 60 mg IV q12h - Continue beta-blocker as tolerated  -down 3L -normal weight is 260  Hypokalemia -replete PO Magnesium normal  Paroxysmal atrial fibrillation - In a sinus rhythm on admission  - Underwent ablation a couple months ago  - CHADS-VASc is 53 (age, gender, CHF, CAD, HTN, DM) - Continue Eliquis, amiodarone, telemetry monitoring during IV diuresis  CAD - No anginal complaints, EKG without acute ischemic features, troponin undetectable  - Hx of angioplasty and stent to RCA  - Continue Lipitor, Toprol  Type II DM  - A1c was 6.9% in January 2018 - Serum glucose 204 on admission  - Has been attempting diet-control  - Check CBG with meals and qHS  - Start a low-intensity SSI    Hypertension  - BP at goal  - Continue Toprol and diltiazem as tolerated   OSA - Tolerating CPAP at home, will continue qHS    DVT prophylaxis:  Fully anticoagulated   Code Status: Full Code   Family Communication:   Disposition Plan:     Consultants:        Subjective: Still with cough Urinated a "bunch" last night-- did not sleep well  Objective: Vitals:   11/12/16 2245 11/13/16 0021 11/13/16  0200 11/13/16 0608  BP: 130/60 137/75 137/68 (!) 120/58  Pulse: 66 67 68 88  Resp: (!) 28 (!) 28 20 20   Temp:   98.7 F (37.1 C)   TempSrc:   Oral   SpO2: 93% 93% 90% 98%  Weight:   120.4 kg (265 lb 6.4 oz)   Height:   5' 6.5" (1.689 m)     Intake/Output Summary (Last 24 hours) at 11/13/16 1234 Last data filed at 11/13/16 1102  Gross per 24 hour  Intake              522 ml  Output             3850 ml  Net            -3328 ml   Filed Weights   11/13/16 0200  Weight: 120.4 kg (265 lb 6.4 oz)     Examination:  General exam: Appears calm and comfortable  Respiratory system: Crackles at bases Cardiovascular system: S1 & S2 heard, RRR. No JVD, murmurs, rubs, gallops or clicks. +LE edema. Gastrointestinal system: Abdomen is nondistended, soft and nontender. No organomegaly or masses felt. Normal bowel sounds heard. Central nervous system: Alert and oriented. No focal neurological deficits. Extremities: Symmetric 5 x 5 power. Skin: No rashes, lesions or ulcers Psychiatry: Judgement and insight appear normal. Mood & affect appropriate.     Data Reviewed: I have personally reviewed following labs and imaging studies  CBC:  Recent Labs Lab 11/12/16 2125  WBC 11.6*  HGB 12.2  HCT 38.4  MCV 99.5  PLT 009   Basic Metabolic Panel:  Recent Labs Lab 11/12/16 2125 11/13/16 0423 11/13/16 0900  NA 135 140  --   K 3.8 3.1*  --   CL 102 99*  --   CO2 21* 26  --   GLUCOSE 201* 103*  --   BUN 17 16  --   CREATININE 1.04* 1.00  --   CALCIUM 8.6* 8.6*  --   MG  --   --  2.1   GFR: Estimated Creatinine Clearance: 69.8 mL/min (by C-G formula based on SCr of 1 mg/dL). Liver Function Tests: No results for input(s): AST, ALT, ALKPHOS, BILITOT, PROT, ALBUMIN in the last 168 hours. No results for input(s): LIPASE, AMYLASE in the last 168 hours. No results for input(s): AMMONIA in the last 168 hours. Coagulation Profile: No results for input(s): INR, PROTIME in the last 168 hours. Cardiac Enzymes: No results for input(s): CKTOTAL, CKMB, CKMBINDEX, TROPONINI in the last 168 hours. BNP (last 3 results) No results for input(s): PROBNP in the last 8760 hours. HbA1C: No results for input(s): HGBA1C in the last 72 hours. CBG:  Recent Labs Lab 11/13/16 0120 11/13/16 0726 11/13/16 1140  GLUCAP 125* 141* 99   Lipid Profile: No results for input(s): CHOL, HDL, LDLCALC, TRIG, CHOLHDL, LDLDIRECT in the last 72 hours. Thyroid Function Tests: No results for input(s): TSH,  T4TOTAL, FREET4, T3FREE, THYROIDAB in the last 72 hours. Anemia Panel: No results for input(s): VITAMINB12, FOLATE, FERRITIN, TIBC, IRON, RETICCTPCT in the last 72 hours. Urine analysis:    Component Value Date/Time   COLORURINE YELLOW 03/28/2016 1050   APPEARANCEUR CLEAR 03/28/2016 1050   LABSPEC 1.015 03/28/2016 1050   PHURINE 6.5 03/28/2016 1050   GLUCOSEU NEGATIVE 03/28/2016 1050   HGBUR NEGATIVE 03/28/2016 1050   HGBUR negative 06/01/2010 0841   BILIRUBINUR NEGATIVE 03/28/2016 1050   BILIRUBINUR n 11/04/2012 [place] 03/28/2016 1050  PROTEINUR NEGATIVE 03/28/2016 1050   UROBILINOGEN 0.2 11/04/2012 1057   UROBILINOGEN 0.2 06/01/2010 0841   NITRITE NEGATIVE 03/28/2016 [place] 03/28/2016 1050     )No results found for this or any previous visit (from the past 240 hour(s)).    Anti-infectives    None       Radiology Studies: Dg Chest 2 View  Result Date: 11/12/2016 CLINICAL DATA:  Acute shortness of breath. EXAM: CHEST  2 VIEW COMPARISON:  09/03/2016 and prior chest radiographs FINDINGS: Cardiomegaly and left-sided pacemaker again noted. New mild to moderate interstitial pulmonary edema identified. No pleural effusion or pneumothorax noted. No acute bony abnormalities are present. Remote bilateral rib fractures are noted. IMPRESSION: Cardiomegaly with mild to moderate interstitial pulmonary edema. Electronically Signed   By: Margarette Canada M.D.   On: 11/12/2016 22:37        Scheduled Meds: . amiodarone  200 mg Oral BID  . apixaban  5 mg Oral BID  . atorvastatin  40 mg Oral Daily  . diltiazem  120 mg Oral Daily  . FLUoxetine  20 mg Oral q morning - 10a  . furosemide  60 mg Intravenous BID  . insulin aspart  0-15 Units Subcutaneous TID WC  . insulin aspart  0-5 Units Subcutaneous QHS  . metoprolol succinate  50 mg Oral BID  . multivitamin with minerals  1 tablet Oral Daily  . potassium chloride SA  20 mEq Oral BID  . sodium  chloride flush  3 mL Intravenous Q12H  . vitamin B-12  5,000 mcg Oral QHS  . vitamin E  1,000 Units Oral Daily   Continuous Infusions: . sodium chloride       LOS: 1 day    Time spent: 25 min    [place], DO [facility] Pager [phone]  If 7PM-7AM, please contact night-coverage [email or web address] Password [facility] 11/13/2016, 12:34 PM

## 2016-11-14 ENCOUNTER — Inpatient Hospital Stay (HOSPITAL_COMMUNITY): Payer: PPO

## 2016-11-14 ENCOUNTER — Ambulatory Visit: Payer: PPO | Admitting: Internal Medicine

## 2016-11-14 DIAGNOSIS — R05 Cough: Secondary | ICD-10-CM

## 2016-11-14 LAB — BASIC METABOLIC PANEL
Anion gap: 11 (ref 5–15)
BUN: 18 mg/dL (ref 6–20)
CO2: 29 mmol/L (ref 22–32)
Calcium: 9.3 mg/dL (ref 8.9–10.3)
Chloride: 99 mmol/L — ABNORMAL LOW (ref 101–111)
Creatinine, Ser: 1.03 mg/dL — ABNORMAL HIGH (ref 0.44–1.00)
GFR calc Af Amer: >60 mL/min (ref >60)
GFR calc non Af Amer: 54 mL/min — ABNORMAL LOW (ref >60)
Glucose, Bld: 127 mg/dL — ABNORMAL HIGH (ref 65–99)
Potassium: 3.9 mmol/L (ref 3.5–5.1)
Sodium: 139 mmol/L (ref 135–145)

## 2016-11-14 LAB — GLUCOSE, CAPILLARY
Glucose-Capillary: 127 mg/dL — ABNORMAL HIGH (ref 65–99)
Glucose-Capillary: 143 mg/dL — ABNORMAL HIGH (ref 65–99)
Glucose-Capillary: 141 mg/dL — ABNORMAL HIGH (ref 65–99)
Glucose-Capillary: 137 mg/dL — ABNORMAL HIGH (ref 65–99)

## 2016-11-14 LAB — HEMOGLOBIN A1C
Hgb A1c MFr Bld: 6.3 % — ABNORMAL HIGH (ref 4.8–5.6)
Mean Plasma Glucose: 134 mg/dL

## 2016-11-14 MED ORDER — FUROSEMIDE 80 MG PO TABS
80.0000 mg | ORAL_TABLET | Freq: Every morning | ORAL | Status: DC
  Administered 2016-11-14 – 2016-11-15 (×2): 80 mg via ORAL
  Filled 2016-11-14 (×2): qty 1

## 2016-11-14 MED ORDER — GUAIFENESIN-DM 100-10 MG/5ML PO SYRP
5.0000 mL | ORAL_SOLUTION | ORAL | Status: DC | PRN
  Administered 2016-11-14: 5 mL via ORAL
  Filled 2016-11-14: qty 5

## 2016-11-14 MED ORDER — FUROSEMIDE 40 MG PO TABS
40.0000 mg | ORAL_TABLET | Freq: Every evening | ORAL | Status: DC
  Administered 2016-11-14: 40 mg via ORAL
  Filled 2016-11-14: qty 1

## 2016-11-14 MED ORDER — PANTOPRAZOLE SODIUM 40 MG PO TBEC
40.0000 mg | DELAYED_RELEASE_TABLET | Freq: Every day | ORAL | Status: DC
  Administered 2016-11-14 – 2016-11-15 (×2): 40 mg via ORAL
  Filled 2016-11-14 (×2): qty 1

## 2016-11-14 MED ORDER — IPRATROPIUM-ALBUTEROL 0.5-2.5 (3) MG/3ML IN SOLN
3.0000 mL | Freq: Four times a day (QID) | RESPIRATORY_TRACT | Status: DC
  Administered 2016-11-14 (×3): 3 mL via RESPIRATORY_TRACT
  Filled 2016-11-14 (×2): qty 3

## 2016-11-14 MED ORDER — LORATADINE 10 MG PO TABS
10.0000 mg | ORAL_TABLET | Freq: Every day | ORAL | Status: DC
  Administered 2016-11-14 – 2016-11-15 (×2): 10 mg via ORAL
  Filled 2016-11-14 (×2): qty 1

## 2016-11-14 NOTE — Progress Notes (Addendum)
PROGRESS NOTE    [name]  XQJ:[id number] DOB: [birth year] DOA: 11/12/2016 PCP: [name], MD   Outpatient Specialists:     Brief Narrative:  [name] is a 71 y.o. female with medical history significant for hypertension, depression, paroxysmal atrial fibrillation, coronary artery disease with stent, OSA on CPAP, history of CVA, and chronic diastolic CHF who presents to the emergency department with 4 days of progressive dyspnea and swelling. She had been taking increased dose of Lasix last couple months and had been is stable condition in terms of respiratory status and swelling until 4 days ago while visiting family out of state and eating without discretion. Over the past 4 days, she has noted increasing SOB, worse with exertion or lying back, as well as progressive BLE swelling. Her husband has been encouraging her to seek evaluation in ED, but patient had been hesitant until tonight when she was having trouble speaking due to dyspnea. She denies any fevers, chills, chest pain, or palpitations. Mild non-productive cough reported. EMS was called out and found patient to be in distress and saturating 80% on room air. She has never required home O2.     Assessment & Plan:   Principal Problem:   Acute on chronic diastolic congestive heart failure (HCC) Active Problems:   Obstructive sleep apnea   Essential hypertension   CAD S/P percutaneous coronary angioplasty   Paroxysmal atrial fibrillation (HCC)   Acute respiratory failure with hypoxia (HCC)   Controlled type 2 diabetes mellitus without complication (HCC)   Acute on chronic diastolic CHF; acute hypoxic respiratory failure   -  4 days of progressive SOB and BLE swelling in setting of dietary indiscretion and not taking diuretics as she was travelling - Saturating 80% on rm air for EMS, requiring 4 Lpm supplemental O2 in ED  - TTE (10/16/16) with EF 44-81%, grade 2 diastolic, moderate LAE, mild MR, mild TR, and moderate  pulmonary HTN  - Managed at home with Lasix 80 mg qAM and 40 mg qPM, metoprolol  - She was given 120 mg IV Lasix in ED - Plan to SLIV, fluid-restrict diet, follow daily wts and I/O's  - diuresis with Lasix 60 mg IV q12h at 4+L and now changed to PO pending chest x ray - Continue beta-blocker as tolerated  -down >4L -normal weight is 260 and now below but patient still with cough and LE edema  Hypokalemia -repleted PO Magnesium normal  CKD stage III -stable  Morbid obesity BMI >40  Recent PFTs with severe obstruction -pt c/o coughing after eating, PPI added -also has sinus issues- claratin -on amiodarone -has follow up appointment with PCP -will probably need high res CT/inhalers -add nebs for now  Paroxysmal atrial fibrillation - In a sinus rhythm on admission  - Underwent ablation a couple months ago  - CHADS-VASc is 79 (age, gender, CHF, CAD, HTN, DM) - Continue Eliquis, amiodarone, telemetry monitoring during IV diuresis  CAD - No anginal complaints, EKG without acute ischemic features, troponin undetectable  - Hx of angioplasty and stent to RCA  - Continue Lipitor, Toprol  Type II DM  - A1c was 6.9% in January 2018 - Serum glucose 204 on admission  - Has been attempting diet-control  - Check CBG with meals and qHS  - Start a low-intensity SSI    Hypertension  - BP at goal  - Continue Toprol and diltiazem as tolerated   OSA - Tolerating CPAP at home, will continue qHS  DVT prophylaxis:  Fully anticoagulated   Code Status: Full Code   Family Communication:   Disposition Plan:  Suspect home in AM   Consultants:        Subjective: Still with LE edema +cough after eating  Objective: Vitals:   11/13/16 1719 11/13/16 2127 11/14/16 0054 11/14/16 0449  BP: 105/64 (!) 125/59 112/64 127/63  Pulse: 68 (!) 113 95 70  Resp: 18 18 18 18   Temp:  99.5 F (37.5 C) 98.9 F (37.2 C) 98.7 F (37.1 C)  TempSrc:  Oral Oral Oral  SpO2: 97%  92% 96% 93%  Weight:    117 kg (257 lb 14.4 oz)  Height:        Intake/Output Summary (Last 24 hours) at 11/14/16 0839 Last data filed at 11/14/16 0553  Gross per 24 hour  Intake             1000 ml  Output             2550 ml  Net            -1550 ml   Filed Weights   11/13/16 0200 11/14/16 0449  Weight: 120.4 kg (265 lb 6.4 oz) 117 kg (257 lb 14.4 oz)    Examination:  General exam: in chair, NAD Respiratory system: Crackles on right side Cardiovascular system: S1 & S2 heard, RRR. No JVD, murmurs, rubs, gallops or clicks. +LE edema. Gastrointestinal system: Abdomen is nondistended, soft and nontender. No organomegaly or masses felt. Normal bowel sounds heard. Central nervous system: Alert and oriented. No focal neurological deficits. Extremities: Symmetric 5 x 5 power. Skin: No rashes, lesions or ulcers Psychiatry: Judgement and insight appear normal. Mood & affect appropriate.     Data Reviewed: I have personally reviewed following labs and imaging studies  CBC:  Recent Labs Lab 11/12/16 2125  WBC 11.6*  HGB 12.2  HCT 38.4  MCV 99.5  PLT 998   Basic Metabolic Panel:  Recent Labs Lab 11/12/16 2125 11/13/16 0423 11/13/16 0900 11/14/16 0511  NA 135 140  --  139  K 3.8 3.1*  --  3.9  CL 102 99*  --  99*  CO2 21* 26  --  29  GLUCOSE 201* 103*  --  127*  BUN 17 16  --  18  CREATININE 1.04* 1.00  --  1.03*  CALCIUM 8.6* 8.6*  --  9.3  MG  --   --  2.1  --    GFR: Estimated Creatinine Clearance: 66.7 mL/min (A) (by C-G formula based on SCr of 1.03 mg/dL (H)). Liver Function Tests: No results for input(s): AST, ALT, ALKPHOS, BILITOT, PROT, ALBUMIN in the last 168 hours. No results for input(s): LIPASE, AMYLASE in the last 168 hours. No results for input(s): AMMONIA in the last 168 hours. Coagulation Profile: No results for input(s): INR, PROTIME in the last 168 hours. Cardiac Enzymes: No results for input(s): CKTOTAL, CKMB, CKMBINDEX, TROPONINI in the  last 168 hours. BNP (last 3 results) No results for input(s): PROBNP in the last 8760 hours. HbA1C:  Recent Labs  11/13/16 0423  HGBA1C 6.3*   CBG:  Recent Labs Lab 11/13/16 0726 11/13/16 1140 11/13/16 1631 11/13/16 2234 11/14/16 0751  GLUCAP 141* 99 111* 129* 127*   Lipid Profile: No results for input(s): CHOL, HDL, LDLCALC, TRIG, CHOLHDL, LDLDIRECT in the last 72 hours. Thyroid Function Tests: No results for input(s): TSH, T4TOTAL, FREET4, T3FREE, THYROIDAB in the last 72 hours. Anemia Panel: No results  for input(s): VITAMINB12, FOLATE, FERRITIN, TIBC, IRON, RETICCTPCT in the last 72 hours. Urine analysis:    Component Value Date/Time   COLORURINE YELLOW 03/28/2016 1050   APPEARANCEUR CLEAR 03/28/2016 1050   LABSPEC 1.015 03/28/2016 1050   PHURINE 6.5 03/28/2016 1050   GLUCOSEU NEGATIVE 03/28/2016 1050   HGBUR NEGATIVE 03/28/2016 1050   HGBUR negative 06/01/2010 0841   BILIRUBINUR NEGATIVE 03/28/2016 1050   BILIRUBINUR n 11/04/2012 1057   KETONESUR NEGATIVE 03/28/2016 1050   PROTEINUR NEGATIVE 03/28/2016 1050   UROBILINOGEN 0.2 11/04/2012 1057   UROBILINOGEN 0.2 06/01/2010 0841   NITRITE NEGATIVE 03/28/2016 [place] 03/28/2016 1050     )No results found for this or any previous visit (from the past 240 hour(s)).    Anti-infectives    None       Radiology Studies: Dg Chest 2 View  Result Date: 11/12/2016 CLINICAL DATA:  Acute shortness of breath. EXAM: CHEST  2 VIEW COMPARISON:  09/03/2016 and prior chest radiographs FINDINGS: Cardiomegaly and left-sided pacemaker again noted. New mild to moderate interstitial pulmonary edema identified. No pleural effusion or pneumothorax noted. No acute bony abnormalities are present. Remote bilateral rib fractures are noted. IMPRESSION: Cardiomegaly with mild to moderate interstitial pulmonary edema. Electronically Signed   By: Margarette Canada M.D.   On: 11/12/2016 22:37        Scheduled Meds: .  amiodarone  200 mg Oral BID  . apixaban  5 mg Oral BID  . atorvastatin  40 mg Oral Daily  . diltiazem  120 mg Oral Daily  . FLUoxetine  20 mg Oral q morning - 10a  . furosemide  40 mg Oral QPM  . furosemide  80 mg Oral q morning - 10a  . insulin aspart  0-15 Units Subcutaneous TID WC  . insulin aspart  0-5 Units Subcutaneous QHS  . loratadine  10 mg Oral Daily  . metoprolol succinate  50 mg Oral BID  . multivitamin with minerals  1 tablet Oral Daily  . pantoprazole  40 mg Oral Daily  . potassium chloride SA  20 mEq Oral BID  . sodium chloride flush  3 mL Intravenous Q12H  . vitamin B-12  5,000 mcg Oral QHS  . vitamin E  1,000 Units Oral Daily   Continuous Infusions: . sodium chloride       LOS: 2 days    Time spent: 25 min    [place], DO [facility] Pager [phone]  If 7PM-7AM, please contact night-coverage [email or web address] Password [facility] 11/14/2016, 8:39 AM

## 2016-11-14 NOTE — Consult Note (Signed)
   [facility] CM Inpatient Consult   11/14/2016  [name] 03/10/1946 [id number]   Patient was assessed for [place] [facility] for community services, with [employer] plan. Patient was previously active with [place] [facility].  Chart review reveals the patient is [name] a 71 y.o.femalewith medical history significant for hypertension, depression, paroxysmal atrial fibrillation, coronary artery disease with stent, OSA on CPAP, history of CVA, and chronic diastolic CHF who presents to the emergency department with 4 days of progressive dyspnea and swelling prior to admission per MD notes. Met with patient at bedside regarding being restarted with [facility] services. Patient has a signed consent on file and brochure, magnet with 24 hour nursing with [place] [facility] information given.   Patient denies any care management needs at this time. Patient verified that her Primary Care provider is [name], MD at [facility] and this office provides post hospital transition calls.   No needs identified at this time. Of note, [facility] Care Management services does not replace or interfere with any services that are arranged by inpatient case management or social work. For additional questions or referrals please contact:  [name], RN BSN [facility] Liaison  [phone] business mobile phone Toll free office [phone]

## 2016-11-14 NOTE — Progress Notes (Signed)
Patient places herself on home cpap when she is ready for bed.

## 2016-11-14 NOTE — Progress Notes (Signed)
Patient is on home CPAP and tolerating well at this time.

## 2016-11-14 NOTE — Progress Notes (Signed)
Assumed care from float RN at 11pm. Patient is in bed. CPAP on. No complaints of pain at this time.

## 2016-11-14 NOTE — Discharge Instructions (Signed)

## 2016-11-15 LAB — BASIC METABOLIC PANEL
Anion gap: 11 (ref 5–15)
BUN: 18 mg/dL (ref 6–20)
CO2: 28 mmol/L (ref 22–32)
Calcium: 9.6 mg/dL (ref 8.9–10.3)
Chloride: 99 mmol/L — ABNORMAL LOW (ref 101–111)
Creatinine, Ser: 1.12 mg/dL — ABNORMAL HIGH (ref 0.44–1.00)
GFR calc Af Amer: 56 mL/min — ABNORMAL LOW (ref >60)
GFR calc non Af Amer: 49 mL/min — ABNORMAL LOW (ref >60)
Glucose, Bld: 133 mg/dL — ABNORMAL HIGH (ref 65–99)
Potassium: 5.1 mmol/L (ref 3.5–5.1)
Sodium: 138 mmol/L (ref 135–145)

## 2016-11-15 LAB — GLUCOSE, CAPILLARY: Glucose-Capillary: 128 mg/dL — ABNORMAL HIGH (ref 65–99)

## 2016-11-15 MED ORDER — LORATADINE 10 MG PO TABS: 10.0000 mg | ORAL_TABLET | Freq: Every day | ORAL | 0 refills | Status: DC

## 2016-11-15 MED ORDER — PANTOPRAZOLE SODIUM 40 MG PO TBEC: 40.0000 mg | DELAYED_RELEASE_TABLET | Freq: Every day | ORAL | 0 refills | Status: DC

## 2016-11-15 NOTE — Progress Notes (Signed)
Pt has orders to be discharged. Discharge instructions given and pt has no additional questions at this time. Medication regimen reviewed and pt educated. Pt verbalized understanding and has no additional questions. Telemetry box removed. IV removed and site in good condition. Pt stable and waiting for transportation.  Victorina Kable RN 

## 2016-11-15 NOTE — Telephone Encounter (Signed)
Pt was given PFT results 11/07/16. Nothing further needed.

## 2016-11-15 NOTE — Discharge Summary (Signed)
Physician Discharge Summary  [name]:[id number] DOB: 1945-09-08 DOA: 11/12/2016  PCP: [name], MD  Admit date: 11/12/2016 Discharge date: 11/15/2016   Recommendations for Outpatient Follow-Up:   1. Follow up for PFTs 2. Resume home C-PAP 3. If cough does not resolve with treatment of GERD/allergies-- schedule appointment with Dr. Annamaria Boots   Discharge Diagnosis:   Principal Problem:   Acute on chronic diastolic congestive heart failure ([place]) Active Problems:   Obstructive sleep apnea   Essential hypertension   CAD S/P percutaneous coronary angioplasty   Paroxysmal atrial fibrillation (HCC)   Acute respiratory failure with hypoxia ([place])   Controlled type 2 diabetes mellitus without complication [facility])   Discharge disposition:  Home:  Discharge Condition: Improved.  Diet recommendation: Low sodium, heart healthy.  Carbohydrate-modified  Wound care: None.   History of Present Illness:   [name] is a 71 y.o. female with medical history significant for hypertension, depression, paroxysmal atrial fibrillation, coronary artery disease with stent, OSA on CPAP, history of CVA, and chronic diastolic CHF who presents to the emergency department with 4 days of progressive dyspnea and swelling. She had been taking increased dose of Lasix last couple months and had been is stable condition in terms of respiratory status and swelling until 4 days ago while visiting family out of state and eating without discretion. Over the past 4 days, she has noted increasing SOB, worse with exertion or lying back, as well as progressive BLE swelling. Her husband has been encouraging her to seek evaluation in ED, but patient had been hesitant until tonight when she was having trouble speaking due to dyspnea. She denies any fevers, chills, chest pain, or palpitations. Mild non-productive cough reported. EMS was called out and found patient to be in distress and saturating 80% on room air. She  has never required home O2.   Hospital Course by Problem:   Acute on chronic diastolic CHF; acute hypoxic respiratory failure  -  4 days of progressive SOB and BLE swelling in setting of dietary indiscretion and not taking diuretics as she was travelling - Saturated 80% on rm air for EMS, requiring 4 Lpm supplemental O2 in ED  - TTE (10/16/16) with EF 47-09%, grade 2 diastolic, moderate LAE, mild MR, mild TR, and moderate pulmonary HTN  - Managed at home with Lasix 80 mg qAM and 40 mg qPM, metoprolol  - She was given 120 mg IV Lasix in ED - xray showed improvement - Continue beta-blocker as tolerated  -down >4L -normal weight is 260  Hypokalemia -repleted PO Magnesium normal  CKD stage III -stable  Morbid obesity BMI >40  Recent PFTs with severe obstruction -pt c/o coughing after eating, PPI added -also has sinus issues- claratin -on amiodarone -has follow up appointment with PCP -may need high res CT/inhalers  Paroxysmal atrial fibrillation - In a sinus rhythm on admission  - Underwent ablation a couple months ago  - CHADS-VASc is 46 (age, gender, CHF, CAD, HTN, DM) - Continue Eliquis, amiodarone  CAD - No anginal complaints, EKG without acute ischemic features, troponin undetectable  - Hx of angioplasty and stent to RCA  - Continue Lipitor, Toprol  Type II DM  - A1c was 6.9% in January 2018  Hypertension  - BP at goal  - Continue Toprol and diltiazem as tolerated  OSA - Tolerating CPAP at home, will continue qHS      Medical Consultants:    None.   Discharge Exam:  Vitals:   11/14/16 2141 11/15/16 0600  BP: 132/72 136/81  Pulse: (!) 102 75  Resp: 18 18  Temp: 98.7 F (37.1 C) 98.6 F (37 C)   Vitals:   11/14/16 2118 11/14/16 2123 11/14/16 2141 11/15/16 0600  BP:   132/72 136/81  Pulse:  77 (!) 102 75  Resp:  18 18 18   Temp:   98.7 F (37.1 C) 98.6 F (37 C)  TempSrc:   Oral Oral  SpO2: 95% 95% 91% 95%  Weight:    117.3 kg  (258 lb 9.6 oz)  Height:        Gen:  NAD    The results of significant diagnostics from this hospitalization (including imaging, microbiology, ancillary and laboratory) are listed below for reference.     Procedures and Diagnostic Studies:   Dg Chest 2 View  Result Date: 11/12/2016 CLINICAL DATA:  Acute shortness of breath. EXAM: CHEST  2 VIEW COMPARISON:  09/03/2016 and prior chest radiographs FINDINGS: Cardiomegaly and left-sided pacemaker again noted. New mild to moderate interstitial pulmonary edema identified. No pleural effusion or pneumothorax noted. No acute bony abnormalities are present. Remote bilateral rib fractures are noted. IMPRESSION: Cardiomegaly with mild to moderate interstitial pulmonary edema. Electronically Signed   By: Margarette Canada M.D.   On: 11/12/2016 22:37     Labs:   Basic Metabolic Panel:  Recent Labs Lab 11/12/16 2125 11/13/16 0423 11/13/16 0900 11/14/16 0511 11/15/16 0509  NA 135 140  --  139 138  K 3.8 3.1*  --  3.9 5.1  CL 102 99*  --  99* 99*  CO2 21* 26  --  29 28  GLUCOSE 201* 103*  --  127* 133*  BUN 17 16  --  18 18  CREATININE 1.04* 1.00  --  1.03* 1.12*  CALCIUM 8.6* 8.6*  --  9.3 9.6  MG  --   --  2.1  --   --    GFR Estimated Creatinine Clearance: 61.4 mL/min (A) (by C-G formula based on SCr of 1.12 mg/dL (H)). Liver Function Tests: No results for input(s): AST, ALT, ALKPHOS, BILITOT, PROT, ALBUMIN in the last 168 hours. No results for input(s): LIPASE, AMYLASE in the last 168 hours. No results for input(s): AMMONIA in the last 168 hours. Coagulation profile No results for input(s): INR, PROTIME in the last 168 hours.  CBC:  Recent Labs Lab 11/12/16 2125  WBC 11.6*  HGB 12.2  HCT 38.4  MCV 99.5  PLT 262   Cardiac Enzymes: No results for input(s): CKTOTAL, CKMB, CKMBINDEX, TROPONINI in the last 168 hours. BNP: Invalid input(s): POCBNP CBG:  Recent Labs Lab 11/14/16 0751 11/14/16 1129 11/14/16 1614  11/14/16 2208 11/15/16 0745  GLUCAP 127* 143* 141* 137* 128*   D-Dimer No results for input(s): DDIMER in the last 72 hours. Hgb A1c  Recent Labs  11/13/16 0423  HGBA1C 6.3*   Lipid Profile No results for input(s): CHOL, HDL, LDLCALC, TRIG, CHOLHDL, LDLDIRECT in the last 72 hours. Thyroid function studies No results for input(s): TSH, T4TOTAL, T3FREE, THYROIDAB in the last 72 hours.  Invalid input(s): FREET3 Anemia work up No results for input(s): VITAMINB12, FOLATE, FERRITIN, TIBC, IRON, RETICCTPCT in the last 72 hours. Microbiology No results found for this or any previous visit (from the past 240 hour(s)).   Discharge Instructions:   Discharge Instructions    (HEART FAILURE PATIENTS) Call MD:  Anytime you have any of the following symptoms: 1) 3 pound weight gain  in 24 hours or 5 pounds in 1 week 2) shortness of breath, with or without a dry hacking cough 3) swelling in the hands, feet or stomach 4) if you have to sleep on extra pillows at night in order to breathe.    Complete by:  As directed    Diet - low sodium heart healthy    Complete by:  As directed    Increase activity slowly    Complete by:  As directed      Allergies as of 11/15/2016      Reactions   Adhesive [tape]    Allergic to EKG stickers and defibrillation pads.      Medication List    TAKE these medications   acetaminophen 325 MG tablet Commonly known as:  TYLENOL Take 2 tablets (650 mg total) by mouth every 4 (four) hours as needed for headache or mild pain. What changed:  when to take this   amiodarone 200 MG tablet Commonly known as:  PACERONE Take 1 tablet (200 mg total) by mouth 2 (two) times daily. Start 08/06/16 - stop Tikosyn today   apixaban 5 MG Tabs tablet Commonly known as:  ELIQUIS Take 1 tablet (5 mg total) by mouth 2 (two) times daily.   atorvastatin 40 MG tablet Commonly known as:  LIPITOR TAKE 1 TABLET BY MOUTH EVERY DAY   B-12 5000 MCG Caps Take 5,000 mcg by mouth at  bedtime.   CALCIUM 600+D PO Take 1 tablet by mouth daily.   diltiazem 120 MG 24 hr capsule Commonly known as:  CARDIZEM CD Take 1 capsule (120 mg total) by mouth daily.   diphenhydramine-acetaminophen 25-500 MG Tabs tablet Commonly known as:  TYLENOL PM Take 2 tablets by mouth at bedtime as needed (pain/sleep).   FLUoxetine 20 MG capsule Commonly known as:  PROZAC TAKE ONE CAPSULE BY MOUTH EVERY MORNING   fluticasone 50 MCG/ACT nasal spray Commonly known as:  FLONASE Place 1 spray into both nostrils daily as needed for allergies or rhinitis.   furosemide 40 MG tablet Commonly known as:  LASIX TAKE 2 TABLETS (80MG ) BY MOUTH IN THE AM AND 1 TABLET (40MG ) BY MOUTH IN THE PM   loratadine 10 MG tablet Commonly known as:  CLARITIN Take 1 tablet (10 mg total) by mouth daily.   Lysine HCl 1000 MG Tabs Take 1,000 mg by mouth daily.   metoprolol succinate 50 MG 24 hr tablet Commonly known as:  TOPROL-XL Take 1 tablet (50 mg total) by mouth 2 (two) times daily. Take with or immediately following a meal.   multivitamin with minerals Tabs tablet Take 1 tablet by mouth daily. Centrum   nitroGLYCERIN 0.4 MG SL tablet Commonly known as:  NITROSTAT Place 1 tablet (0.4 mg total) under the tongue every 5 (five) minutes x 3 doses as needed for chest pain.   pantoprazole 40 MG tablet Commonly known as:  PROTONIX Take 1 tablet (40 mg total) by mouth daily.   potassium chloride SA 20 MEQ tablet Commonly known as:  KLOR-CON M20 Take 1 tablet (20 mEq total) by mouth daily. What changed:  when to take this   [place] into the lungs at bedtime. CPAP   SYSTANE OP Place 1 drop into both eyes daily as needed (dry eyes).   vitamin C 1000 MG tablet Take 1,000 mg by mouth daily.   vitamin E 1000 UNIT capsule Take 1,000 Units by mouth daily.      Follow-up Information  [name], [name], MD Follow up in 1 week(s).   Specialties:  Internal Medicine,  Pediatrics Why:  follow up hospital/PFTs Contact information: [place] [id number] [phone]            Time coordinating discharge: 35 min  Signed:  [place]   [facility] 11/15/2016, 8:14 AM

## 2016-11-16 ENCOUNTER — Telehealth: Payer: Self-pay

## 2016-11-16 NOTE — Telephone Encounter (Signed)
LMTCB

## 2016-11-17 NOTE — Telephone Encounter (Signed)
LMTCB

## 2016-11-21 ENCOUNTER — Encounter: Payer: Self-pay | Admitting: Cardiovascular Disease

## 2016-11-21 ENCOUNTER — Ambulatory Visit (INDEPENDENT_AMBULATORY_CARE_PROVIDER_SITE_OTHER): Payer: PPO | Admitting: Cardiovascular Disease

## 2016-11-21 VITALS — BP 110/90 | HR 90 | Ht 66.0 in | Wt 256.0 lb

## 2016-11-21 DIAGNOSIS — I480 Paroxysmal atrial fibrillation: Secondary | ICD-10-CM | POA: Diagnosis not present

## 2016-11-21 DIAGNOSIS — I5032 Chronic diastolic (congestive) heart failure: Secondary | ICD-10-CM | POA: Diagnosis not present

## 2016-11-21 MED ORDER — AMIODARONE HCL 200 MG PO TABS: 200.0000 mg | ORAL_TABLET | Freq: Every day | ORAL | 11 refills | Status: DC

## 2016-11-21 NOTE — Patient Instructions (Signed)
Medication Instructions:  1) DECREASE AMIODARONE to 200 mg daily  Labwork: None  Testing/Procedures: None  Follow-Up: Your physician wants you to follow-up in: 6 months with Dr. Acie Fredrickson. You will receive a reminder letter in the mail two months in advance. If you don't receive a letter, please call our office to schedule the follow-up appointment.   Any Other Special Instructions Will Be Listed Below (If Applicable).     If you need a refill on your cardiac medications before your next appointment, please call your pharmacy.

## 2016-11-21 NOTE — Progress Notes (Signed)
[name] Date of Birth  April 26, 1946 [facility] [address]    [place], [place]  [address] [phone]  Fax  [phone]   Problem list: 1. Atrial fibrillation - s/p ablation September 29, 2016 2. Status post pacemaker implantation 3. Coronary artery disease-status post PTCA and stenting of her right coronary artery 4. Hyperlipidemia 5. Hypertension 6. Obstructive sleep apnea-currently on CPAP 7.   History of Present Illness:  [name] is a 71 year old female with a history of atrial fibrillation. She status post pacemaker implantation. She also has a history of coronary artery disease and is status post PTCA and stenting of the right coronary artery. She also has a history of hyperlipidemia, hypertension, and sleep apnea.  She uses a CPAP mask at night.  The CPAP mask has really helped her stay in normal sinus rhythm.  She complains of having some episodes of chest tightness. This occurs at times. One episode was relieved with sublingual nitroglycerin. She states that it is not nearly as severe as her previous episodes of angina.  January 13, 21014: She was hospitalized in November with severe shortness breath and "double pneumonia". She is slowly recovering.  She's looking forward to  getting back into her exercise program.  She has not been exercising at all.  She's had some leg edema.  She denies any chest pain or shortness breath.  She's has mild leg swelling for the past several months. She does not add salt any of her food. She tries to stay away from salty foods and processed meat.  September 18, 2013:  She remains short of breath  - especialy with exertion  Echo in Jan. 2014:   - Left ventricle: The cavity size was normal. Wall thickness was increased in a pattern of mild LVH. Systolic function was normal. The estimated ejection fraction was in the range of 60% to 65%. - Pulmonary arteries: PA peak pressure: 77mm Hg   October 03, 2013:  Level was seen about  2 weeks ago with some increasing shortness of breath. She was noted to have moderate pulmonary hypertension on echo. She started on diltiazem. She has fairly well preserved left ventricle systolic function.  We started her on Cardizem XL 180 mg a day. She has not done well since that medication in addition. She's more short of breath. She's gained about 5 pounds.  She presents today for her echocardiogram. She is very short of breath and was worked into the schedule. Her O2 saturations at rest were 86%. We placed  oxygen on her and her O2 saturations increased above 90%. She is clearly more short of breath after the addition of the diltiazem.   She has not been eating any extra salt .  Has been taking her lasix.   She denies any chest pain - specifically was asked about pleuretic CP.  Had orthopnea until we placed the oxygen on her.   10/20/2013: [name] was admitted to the hospital with severe shortness breath when I saw her several weeks ago. She was found to be volume overload. She was aggressively diuresed and lost about 10 pounds of weight. Cardiac catheterization following that diuresis revealed only minimal coronary artery irregularities. Her left ventricular systolic function was normal. Her PA pressures were moderately elevated with an estimated PA pressure of around 57.   She was discharged on the same dose of Lasix that she was on at admission.   She is avoiding salt.    Dec. 3, 2015:  [name] is a 71 yo followed for chronic diastolic CHf and pulmonary hypertension ( est. PA pressure of 70)  [name] has been back in the hospital since I last saw her.  She has  Increased her lasix which has helped.  She does a pretty good job of avoiding salt.   Sept. 27, 2016:  Is doing well.  Has some dyspnea - she thinks likely due to weight gain . No cardiac symptoms .  Needs to have back surgery .   October 06, 2015:  Overall doing well. Had her back surgery in Oct.  Wt. 267 lbs today   Jan. 4,  2018:  [name] is seen back today following a recent hospitalization in Sept. 2017.   She was complaining of dyspnea , cough. She was cardioverted  She has diastolic dysfunction   She is now back in atrial fib. She new that she had converted back into a-fib.   HR are typically elevated when she is in atrial fib.   Has had some right flank pain .   Was found to have an atrophic right kidney.  Was in the hospital in [place], [place].   Has not tried a higher dose of Tikosyn.    Labs from her recent hospitalization on 06/24/2016 show a potassium level of 4.3. Her magnesium level was 2.0.  October 16, 2016:  [name] is seen today  She has had an A-Fib ablation ( September 29, 2016) since I have seen her She is very shaky today - having trouble getting her breath while walking in today , her lasix was held following her ablation.    She restarted her Lasix 2 weeks ago .   She is feeling generally better since restarting the Lasix   11/21/2016: [name] was recently hospitalized with acute on chronic diastolic congestive heart failure. She had been traveling quite a bit and had been eating lots of salty food. She had also been holding various doses of Lasix set she was traveling. She was diuresed 7 lbs.  Feeling much better   She was supposed to decrease her amio to 200 mg daily but has continued on the 200 mg BID.    Current Outpatient Prescriptions on File Prior to Visit  Medication Sig Dispense Refill  . acetaminophen (TYLENOL) 325 MG tablet Take 2 tablets (650 mg total) by mouth every 4 (four) hours as needed for headache or mild pain. (Patient taking differently: Take 650 mg by mouth every morning. )    . amiodarone (PACERONE) 200 MG tablet Take 1 tablet (200 mg total) by mouth 2 (two) times daily. Start 08/06/16 - stop Tikosyn today 60 tablet 4  . apixaban (ELIQUIS) 5 MG TABS tablet Take 1 tablet (5 mg total) by mouth 2 (two) times daily. 60 tablet 6  . Ascorbic Acid (VITAMIN C) 1000 MG tablet Take 1,000  mg by mouth daily.    [name] atorvastatin (LIPITOR) 40 MG tablet TAKE 1 TABLET BY MOUTH EVERY DAY 90 tablet 2  . Calcium Carbonate-Vitamin D (CALCIUM 600+D PO) Take 1 tablet by mouth daily.    . Cyanocobalamin (B-12) 5000 MCG CAPS Take 5,000 mcg by mouth at bedtime.     [name] diltiazem (CARDIZEM CD) 120 MG 24 hr capsule Take 1 capsule (120 mg total) by mouth daily. 30 capsule 11  . diphenhydramine-acetaminophen (TYLENOL PM) 25-500 MG TABS tablet Take 2 tablets by mouth at bedtime as needed (pain/sleep).    [name] FLUoxetine (PROZAC) 20 MG capsule TAKE ONE CAPSULE BY  MOUTH EVERY MORNING 90 capsule 1  . fluticasone (FLONASE) 50 MCG/ACT nasal spray Place 1 spray into both nostrils daily as needed for allergies or rhinitis.    . furosemide (LASIX) 40 MG tablet TAKE 2 TABLETS (80MG ) BY MOUTH IN THE AM AND 1 TABLET (40MG ) BY MOUTH IN THE PM 270 tablet 3  . loratadine (CLARITIN) 10 MG tablet Take 1 tablet (10 mg total) by mouth daily. 30 tablet 0  . Lysine HCl 1000 MG TABS Take 1,000 mg by mouth daily.     . metoprolol succinate (TOPROL-XL) 50 MG 24 hr tablet Take 1 tablet (50 mg total) by mouth 2 (two) times daily. Take with or immediately following a meal. 60 tablet 11  . Multiple Vitamin (MULTIVITAMIN WITH MINERALS) TABS tablet Take 1 tablet by mouth daily. Centrum    . nitroGLYCERIN (NITROSTAT) 0.4 MG SL tablet Place 1 tablet (0.4 mg total) under the tongue every 5 (five) minutes x 3 doses as needed for chest pain. 25 tablet 6  . pantoprazole (PROTONIX) 40 MG tablet Take 1 tablet (40 mg total) by mouth daily. 30 tablet 0  . Polyethyl Glycol-Propyl Glycol (SYSTANE OP) Place 1 drop into both eyes daily as needed (dry eyes).    . potassium chloride SA (KLOR-CON M20) 20 MEQ tablet Take 1 tablet (20 mEq total) by mouth daily. (Patient taking differently: Take 20 mEq by mouth 2 (two) times daily. ) 180 tablet 3  . PRESCRIPTION MEDICATION Inhale into the lungs at bedtime. CPAP    . vitamin E 1000 UNIT capsule Take 1,000  Units by mouth daily.     No current facility-administered medications on file prior to visit.     Allergies  Allergen Reactions  . Adhesive [Tape]     Allergic to EKG stickers and defibrillation pads.    Past Medical History:  Diagnosis Date  . Anticoagulant long-term use    pradaxa  . Anxiety   . Arthritis    low back & both knees, Spondylolisthesis    . CAD (coronary artery disease) 0347,4259   post PTCA with bare-metal stenting to mid RCA in December 2004     . CHF (congestive heart failure) ([place])   . Chronic atrial fibrillation ([place]) 06/2007   Tachybradycardia pacemaker  . Chronic kidney disease    10% function - ?R, other kidney is compensating    . Depression   . Diplopia 06/19/2008   Qualifier: Diagnosis of  By: [name] MD, [name]   . Dysrhythmia    ATRIAL FIBRILATION  . Edema of lower extremity   . History of acute inferior wall MI   . History of CVA (cerebrovascular accident)   . Hyperlipidemia   . Hypertension   . Myocardial infarction ([place]) [id number]  . OSA on CPAP    last test- 2010  . Pacemaker   . Pneumonia 2014   tx. ----  [facility]  . Shortness of breath   . Stroke ([place]) [id number]  . TIA (transient ischemic attack)   . Transfusion history   . Unspecified hemorrhoids without mention of complication 5/63/8756   Colonoscopy--Dr. Carlean Purl     Past Surgical History:  Procedure Laterality Date  . APPENDECTOMY    . ATRIAL FIBRILLATION ABLATION N/A 09/29/2016   Procedure: Atrial Fibrillation Ablation;  Surgeon: Will Meredith Leeds, MD;  Location: [facility];  Service: Cardiovascular;  Laterality: N/A;  . back surgeries     x2-3 by Dr Trenton Gammon  . BACK SURGERY    .  CHOLECYSTECTOMY  1994  . CORONARY ANGIOPLASTY WITH STENT PLACEMENT  1998  . CORONARY STENT PLACEMENT     C stent  . DOPPLER ECHOCARDIOGRAPHY  2009  . ELECTROPHYSIOLOGIC STUDY N/A 03/31/2016   Procedure: Cardioversion;  Surgeon: Evans Lance, MD;  Location: [facility];   Service: Cardiovascular;  Laterality: N/A;  . ELECTROPHYSIOLOGIC STUDY N/A 08/04/2016   Procedure: Cardioversion;  Surgeon: Evans Lance, MD;  Location: [facility];  Service: Cardiovascular;  Laterality: N/A;  . LEFT AND RIGHT HEART CATHETERIZATION WITH CORONARY ANGIOGRAM N/A 10/06/2013   Procedure: LEFT AND RIGHT HEART CATHETERIZATION WITH CORONARY ANGIOGRAM;  Surgeon: Troy Sine, MD;  Location: [facility];  Service: Cardiovascular;  Laterality: N/A;  . permanent pacemaker  06/2007  . TEE WITHOUT CARDIOVERSION N/A 09/29/2016   Procedure: TRANSESOPHAGEAL ECHOCARDIOGRAM (TEE);  Surgeon: Jerline Pain, MD;  Location: [facility] ENDOSCOPY;  Service: Cardiovascular;  Laterality: N/A;  . TOTAL ABDOMINAL HYSTERECTOMY  1984   2 surgeries endometriosis bso    History  Smoking Status  . Former Smoker  . Packs/day: 1.00  . Years: 5.00  . Types: Cigarettes  . Start date: 05/11/1978  . Quit date: 07/03/1982  Smokeless Tobacco  . Never Used    History  Alcohol Use  . Yes    Comment: once q 6 months-socailly-wine    Family History  Problem Relation Age of Onset  . Suicidality Father        suicide death pt was 3 yrs  . Arrhythmia Mother   . Hypertension Mother   . Diabetes Mother   . Heart attack Brother 72  . Heart disease Paternal Aunt   . Prostate cancer Maternal Grandfather   . Diabetes Paternal Grandfather        fathers side of the family    Reviw of Systems:  Reviewed in the HPI.  All other systems are negative.  Physical Exam: BP 110/90   Pulse 90   Ht 5\' 6"  (1.676 m)   Wt 256 lb (116.1 kg)   SpO2 94%   BMI 41.32 kg/m  The patient is alert and oriented x 3.     HEENT:   She has normal carotids.  no JVD,   Her mucous membranes are moist. Lungs: Lung exam is clear.  Heart: Regular rate S1-S2.  No S3 heard Abdomen: Her abdomen is soft. She has good bowel sounds. Extremities:  She has 1-2+ bilateral edema  Neuro:  Her gait is normal. Her neuro exam is nonfocal.     ECG: October 16, 2016:    NSR at 65.   No ST or T wave changes   Assessment / Plan:  1. Acute on chronic diastolic CHF:    She was recently hospitalized with acute exacerbation of congestive heart failure. She had been traveling and was not taking her Lasix regularly while traveling. She was also eating lots of salty foods. She was hospitalized and was diuresed about 7 pounds. She seems to be doing better. We will continue with her current heart failure medications.  I've encouraged her to work on weight loss program.  2. Atrial fibrillation -   She has had an ablation.   She has been taking amiodarone 200 mg twice a day for the past several months.  She was supposed to reduce her dose to 200 mg daily at some point but has continued taking it twice a day. We will make sure that she decreases her dose to 200  mg.  She has a follow up appt with Dr. Curt Bears in June.   2. Status post pacemaker implantation 3. Coronary artery disease-status post PTCA and stenting of her right coronary artery - no angina   4. Hyperlipidemia -       5. Hypertension -  BP is well controlled   6. Obstructive sleep apnea-currently on CPAP 7. Spinal disc disease.       Mertie Moores, MD  11/21/2016 8:25 AM    [facility] [place],  [place], [place]  [address] Pager [phone] Phone: [phone]; Fax: [phone]

## 2016-11-21 NOTE — Progress Notes (Signed)
Chief Complaint  Patient presents with  . Hospitalization Follow-up  . Medicare Wellness    HPI: [name] 71 y.o. appt because was in hospital for acute on chronic heart failure and  Made appt with pcp.  She is under care by cards   Diuresed 7 # in hospt  diabes is stable   claritin and  And  ? If allergy   Reflux cause  And cough is better .  She had to travel to see her elderly mother and stopped her diuretic temporarily because of lack of bathroom facilities on the way. She thinks that plus some dietary changes may have caused the significant fluid overload and thus hospitalization. She states that her shortness of breath is much better than when she went in the hospital. She did have the lung function tests and is under care for sleep apnea also. No new falling will be traveling shortly .   Below is her discharge summary review. Admit date: 11/12/2016 Discharge date: 11/15/2016   Recommendations for Outpatient Follow-Up:   1. Follow up for PFTs 2. Resume home C-PAP 3. If cough does not resolve with treatment of GERD/allergies-- schedule appointment with Dr. Annamaria Boots   Discharge Diagnosis:   Principal Problem:   Acute on chronic diastolic congestive heart failure ([place]) Active Problems:   Obstructive sleep apnea   Essential hypertension   CAD S/P percutaneous coronary angioplasty   Paroxysmal atrial fibrillation (HCC)   Acute respiratory failure with hypoxia ([place])   Controlled type 2 diabetes mellitus without complication ([place])   Discharge disposition:  Home:  ROS: See pertinent positives and negatives per HPI.  Past Medical History:  Diagnosis Date  . Anticoagulant long-term use    pradaxa  . Anxiety   . Arthritis    low back & both knees, Spondylolisthesis    . CAD (coronary artery disease) 5974,1638   post PTCA with bare-metal stenting to mid RCA in December 2004     . CHF (congestive heart failure) ([place])   . Chronic atrial fibrillation ([place])  06/2007   Tachybradycardia pacemaker  . Chronic kidney disease    10% function - ?R, other kidney is compensating    . Depression   . Diplopia 06/19/2008   Qualifier: Diagnosis of  By: [name] MD, [name]   . Dysrhythmia    ATRIAL FIBRILATION  . Edema of lower extremity   . History of acute inferior wall MI   . History of CVA (cerebrovascular accident)   . Hyperlipidemia   . Hypertension   . Myocardial infarction ([place]) [id number]  . OSA on CPAP    last test- 2010  . Pacemaker   . Pneumonia 2014   tx. ----  [facility]  . Shortness of breath   . Stroke ([place]) [id number]  . TIA (transient ischemic attack)   . Transfusion history   . Unspecified hemorrhoids without mention of complication 4/53/6468   Colonoscopy--Dr. Carlean Purl     Family History  Problem Relation Age of Onset  . Suicidality Father        suicide death pt was 3 yrs  . Arrhythmia Mother   . Hypertension Mother   . Diabetes Mother   . Heart attack Brother 76  . Heart disease Paternal Aunt   . Prostate cancer Maternal Grandfather   . Diabetes Paternal Grandfather        fathers side of the family    Social History   Social History  . Marital status: Married  Spouse name: N/A  . Number of children: 0  . Years of education: HS   Occupational History  . retired     previously worked [employer]  . Smoking status: Former Smoker    Packs/day: 1.00    Years: 5.00    Types: Cigarettes    Start date: 05/11/1978    Quit date: 07/03/1982  . Smokeless tobacco: Never Used  . Alcohol use Yes     Comment: once q 6 months-socailly-wine  . Drug use: No  . Sexual activity: Not Currently   Other Topics Concern  . 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   Married    Originally from [place] of two, high school education   Former smoker [phone] 1ppd   Hunting dogs 7    Retired from [employer] 2     Outpatient Medications Prior to Visit  Medication Sig Dispense Refill  . acetaminophen (TYLENOL) 325 MG tablet Take 2 tablets (650 mg total) by mouth every 4 (four) hours as needed for headache or mild pain. (Patient taking differently: Take 650 mg by mouth every morning. )    . amiodarone (PACERONE) 200 MG tablet Take 1 tablet (200 mg total) by mouth daily. 30 tablet 11  . apixaban (ELIQUIS) 5 MG TABS tablet Take 1 tablet (5 mg total) by mouth 2 (two) times daily. 60 tablet 6  . Ascorbic Acid (VITAMIN C) 1000 MG tablet Take 1,000 mg by mouth daily.    [name] atorvastatin (LIPITOR) 40 MG tablet TAKE 1 TABLET BY MOUTH EVERY DAY 90 tablet 2  . Calcium Carbonate-Vitamin D (CALCIUM 600+D PO) Take 1 tablet by mouth daily.    . Cyanocobalamin (B-12) 5000 MCG CAPS Take 5,000 mcg by mouth at bedtime.     [name] diltiazem (CARDIZEM CD) 120 MG 24 hr capsule Take 1 capsule (120 mg total) by mouth daily. 30 capsule 11  . diphenhydramine-acetaminophen (TYLENOL PM) 25-500 MG TABS tablet Take 2 tablets by mouth at bedtime as needed (pain/sleep).    [name] FLUoxetine (PROZAC) 20 MG capsule TAKE ONE CAPSULE BY MOUTH EVERY MORNING 90 capsule 1  . fluticasone (FLONASE) 50 MCG/ACT nasal spray Place 1 spray into both nostrils daily as needed for allergies or rhinitis.    . furosemide (LASIX) 40 MG tablet TAKE 2 TABLETS (80MG) BY MOUTH IN THE AM AND 1 TABLET (40MG) BY MOUTH IN THE PM 270 tablet 3  . loratadine (CLARITIN) 10 MG tablet Take 1 tablet (10 mg total) by mouth daily. 30 tablet 0  . Lysine HCl 1000 MG TABS Take 1,000 mg by mouth daily.     . metoprolol succinate (TOPROL-XL) 50 MG 24 hr tablet Take 1 tablet (50 mg total) by mouth 2 (two) times daily. Take with or immediately following a meal. 60 tablet 11  . Multiple Vitamin (MULTIVITAMIN WITH MINERALS) TABS tablet Take 1 tablet by mouth daily. Centrum    . nitroGLYCERIN (NITROSTAT) 0.4 MG SL tablet Place 1 tablet (0.4 mg total) under the tongue every 5  (five) minutes x 3 doses as needed for chest pain. 25 tablet 6  . pantoprazole (PROTONIX) 40 MG tablet Take 1 tablet (40 mg total) by mouth daily. 30 tablet 0  . Polyethyl Glycol-Propyl Glycol (SYSTANE OP) Place 1 drop into both eyes daily as needed (dry eyes).    [name]  potassium chloride SA (KLOR-CON M20) 20 MEQ tablet Take 1 tablet (20 mEq total) by mouth daily. (Patient taking differently: Take 20 mEq by mouth 2 (two) times daily. ) 180 tablet 3  . PRESCRIPTION MEDICATION Inhale into the lungs at bedtime. CPAP    . vitamin E 1000 UNIT capsule Take 1,000 Units by mouth daily.     No facility-administered medications prior to visit.      EXAM:  BP 110/80 (BP Location: Right Arm, Patient Position: Sitting, Cuff Size: Large)   Pulse 71   Temp 98.5 F (36.9 C) (Oral)   Ht '5\' 6"'  (1.676 m)   Wt 257 lb 6.4 oz (116.8 kg)   BMI 41.55 kg/m   Body mass index is 41.55 kg/m.  GENERAL: vitals reviewed and listed above, alert, oriented, appears well hydrated and in no acute distress HEENT: atraumatic, conjunctiva  clear, no obvious abnormalities on inspection of external nose and ears OP : no lesion edema or exudate  NECK: no obvious masses on inspection palpation  LUNGS: clear to auscultation bilaterally, no wheezes, rales or rhonchi, good air movement CV: HRRR, no clubbing cyanosis  Min  peripheral edema nl cap refill  MS: moves all extremities without noticeable focal  abnormality PSYCH: pleasant and cooperative, no obvious depression or anxiety Lab Results  Component Value Date   WBC 11.6 (H) 11/12/2016   HGB 12.2 11/12/2016   HCT 38.4 11/12/2016   PLT 262 11/12/2016   GLUCOSE 133 (H) 11/15/2016   CHOL 116 10/16/2016   TRIG 139 10/16/2016   HDL 42 10/16/2016   LDLCALC 46 10/16/2016   ALT 26 10/16/2016   AST 23 10/16/2016   NA 138 11/15/2016   K 5.1 11/15/2016   CL 99 (L) 11/15/2016   CREATININE 1.12 (H) 11/15/2016   BUN 18 11/15/2016   CO2 28 11/15/2016   TSH 4.390 10/16/2016    INR 1.1 08/02/2016   HGBA1C 6.3 (H) 11/13/2016   BP Readings from Last 3 Encounters:  11/22/16 110/80  11/21/16 110/90  11/15/16 136/81   Wt Readings from Last 3 Encounters:  11/22/16 257 lb 6.4 oz (116.8 kg)  11/21/16 256 lb (116.1 kg)  11/15/16 258 lb 9.6 oz (117.3 kg)    ASSESSMENT AND PLAN:  Discussed the following assessment and plan:  Dyspnea, unspecified type - Plan: Ambulatory referral to Pulmonology  Need for hepatitis C screening test - Plan: Hepatitis C Antibody  Hx of hyperglycemia - Plan: Urine Microalbumin w/creat. ratio  Medication management  Chronic diastolic heart failure- grade 2 diastolic dysfunction by echo 10/03/13 - Plan: Ambulatory referral to [facility] discharge follow-up  Abnormal PFT - obs and restrictive   was before her hosp  - Plan: Ambulatory referral to Pulmonology  Cough, persistent - better on claritin an protonix  - Plan: Ambulatory referral to Pulmonology  Medicare annual wellness visit, subsequent  We'll send a flag to Dr. Kimberlee Nearing I would like him to follow-up for the abnormal PFTs if he is still taking his regular patients. Otherwise we'll make other plans for consult. -Patient advised to return or notify health care team  if  new concerns arise.  Patient Instructions   Glad you are doing  Better  .  Stay on the Claritin and Protonix at this time. Since your cough  Is better.   Will be contacted about referral to pulmonary because of the  abnormal pulmonary function tests.   Can make wellness  Appt with Wynetta Fines .  Plan  rov    2 mos .     Ms. Stieg , Thank you for taking time to come for your Medicare Wellness Visit. I appreciate your ongoing commitment to your health goals. Please review the following plan we discussed and let me know if I can assist you in the future.   Please schedule an eye visit with the doctor of your choice   Will call and make an apt for your mammogram   Shingrix is a vaccine for the  prevention of Shingles in Adults 50 and older.  If you are on Medicare, you can request a prescription from your doctor to be filled at a pharmacy.  Please check with your benefits regarding applicable copays or out of pocket expenses.  The Shingrix is given in 2 vaccines approx 8 weeks apart. You must receive the 2nd dose prior to 6 months from receipt of the first.   I have ordered your hepatitis C as a medicare guideline and can be drawn at your next blood I have ordered your microalbumin for the next blood draw   These are the goals we discussed: Goals    . Weight (lb) < 200 lb (90.7 kg)          Motivation is a 5 and family;  You feel better when you eat salads; cut back on high calories foods as you are doing  Check out  online nutrition programs as GumSearch.nl and http://vang.com/; fit34m; Look for foods with "whole" wheat; bran; oatmeal etc Shot at the farmer's markets in season for fresher choices  Watch for "hydrogenated" on the label of oils which are trans-fats.  Watch for "high fructose corn syrup" in snacks, yogurt or ketchup  Meats have less marbling; bright colored fruits and vegetables;  Canned; dump out liquid and wash vegetables. Be mindful of what we are eating  Portion control is essential to a health weight! Sit down; take a break and enjoy your meal; take smaller bites; put the fork down between bites;  It takes 20 minutes to get full; so check in with your fullness cues and stop eating when you start to fill full   Look up online 25 go to small 100  Or 150 calorie snacks and bag them Challenge yourself with new recipes  Adding protein to you meals is good; try to allow for one or two starches  Or pick your daily goal               This is a list of the screening recommended for you and due dates:  Health Maintenance  Topic Date Due  .  Hepatitis C: One time screening is recommended by Center for Disease Control  (CDC) for  adults born from  [birth year]through 1965.   01947-08[birth year] . Urine Protein Check  02/08/1956  . Eye exam for diabetics  07/03/2010  . Mammogram  04/08/2016  . Complete foot exam   06/10/2016  . Flu Shot  01/31/2017  . Hemoglobin A1C  05/16/2017  . Tetanus Vaccine  06/03/2019  . Colon Cancer Screening  08/05/2022  . DEXA scan (bone density measurement)  Completed  . Pneumonia vaccines  Completed      Educated regarding prediabetes and numbers;  A1c ranges from 5.8 to 6.5 or fasting Blood sugar > 115 -126; (126 is diabetic)   Risk: >45yo; family hx; overweight or obese; African American; Hispanic; Latino; American [place]; PLuna history of diabetes when pregnant; or birth to  a baby weighing over 9 lbs. Being less physically active than 30 minutes; 3 times a week;   Prevention; Losing a modest 7 to 8 lbs; If over 200 lbs; 10 to 14 lbs;  Choose healthier foods; colorful veggies; fish or lean meats; drinks water Reduce portion size Start exercising; 30 minutes of fast walking x 30 minutes per day/ 60 min for weight loss       DASH Eating Plan DASH stands for "Dietary Approaches to Stop Hypertension." The DASH eating plan is a healthy eating plan that has been shown to reduce high blood pressure (hypertension). It may also reduce your risk for type 2 diabetes, heart disease, and stroke. The DASH eating plan may also help with weight loss. What are tips for following this plan? General guidelines   Avoid eating more than 2,300 mg (milligrams) of salt (sodium) a day. If you have hypertension, you may need to reduce your sodium intake to 1,500 mg a day.  Limit alcohol intake to no more than 1 drink a day for nonpregnant women and 2 drinks a day for men. One drink equals 12 oz of beer, 5 oz of wine, or 1 oz of hard liquor.  Work with your health care provider to maintain a healthy body weight or to lose weight. Ask what an ideal weight is for you.  Get at least 30 minutes of exercise  that causes your heart to beat faster (aerobic exercise) most days of the week. Activities may include walking, swimming, or biking.  Work with your health care provider or diet and nutrition specialist (dietitian) to adjust your eating plan to your individual calorie needs. Reading food labels   Check food labels for the amount of sodium per serving. Choose foods with less than 5 percent of the Daily Value of sodium. Generally, foods with less than 300 mg of sodium per serving fit into this eating plan.  To find whole grains, look for the word "whole" as the first word in the ingredient list. Shopping   Buy products labeled as "low-sodium" or "no salt added."  Buy fresh foods. Avoid canned foods and premade or frozen meals. Cooking   Avoid adding salt when cooking. Use salt-free seasonings or herbs instead of table salt or sea salt. Check with your health care provider or pharmacist before using salt substitutes.  Do not fry foods. Cook foods using healthy methods such as baking, boiling, grilling, and broiling instead.  Cook with heart-healthy oils, such as olive, canola, soybean, or sunflower oil. Meal planning    Eat a balanced diet that includes:  5 or more servings of fruits and vegetables each day. At each meal, try to fill half of your plate with fruits and vegetables.  Up to 6-8 servings of whole grains each day.  Less than 6 oz of lean meat, poultry, or fish each day. A 3-oz serving of meat is about the same size as a deck of cards. One egg equals 1 oz.  2 servings of low-fat dairy each day.  A serving of nuts, seeds, or beans 5 times each week.  Heart-healthy fats. Healthy fats called Omega-3 fatty acids are found in foods such as flaxseeds and coldwater fish, like sardines, salmon, and mackerel.  Limit how much you eat of the following:  Canned or prepackaged foods.  Food that is high in trans fat, such as fried foods.  Food that is high in saturated fat, such  as fatty meat.  Sweets, desserts, sugary drinks,  and other foods with added sugar.  Full-fat dairy products.  Do not salt foods before eating.  Try to eat at least 2 vegetarian meals each week.  Eat more home-cooked food and less restaurant, buffet, and fast food.  When eating at a restaurant, ask that your food be prepared with less salt or no salt, if possible. What foods are recommended? The items listed may not be a complete list. Talk with your dietitian about what dietary choices are best for you. Grains  Whole-grain or whole-wheat bread. Whole-grain or whole-wheat pasta. Brown rice. [place]ena Morrow. Bulgur. Whole-grain and low-sodium cereals. Pita bread. Low-fat, low-sodium crackers. Whole-wheat flour tortillas. Vegetables  Fresh or frozen vegetables (raw, steamed, roasted, or grilled). Low-sodium or reduced-sodium tomato and vegetable juice. Low-sodium or reduced-sodium tomato sauce and tomato paste. Low-sodium or reduced-sodium canned vegetables. Fruits  All fresh, dried, or frozen fruit. Canned fruit in natural juice (without added sugar). Meat and other protein foods  Skinless chicken or [place]. Ground chicken or [place]. Pork with fat trimmed off. Fish and seafood. Egg whites. Dried beans, peas, or lentils. Unsalted nuts, nut butters, and seeds. Unsalted canned beans. Lean cuts of beef with fat trimmed off. Low-sodium, lean deli meat. Dairy  Low-fat (1%) or fat-free (skim) milk. Fat-free, low-fat, or reduced-fat cheeses. Nonfat, low-sodium ricotta or cottage cheese. Low-fat or nonfat yogurt. Low-fat, low-sodium cheese. Fats and oils  Soft margarine without trans fats. Vegetable oil. Low-fat, reduced-fat, or light mayonnaise and salad dressings (reduced-sodium). Canola, safflower, olive, soybean, and sunflower oils. Avocado. Seasoning and other foods  Herbs. Spices. Seasoning mixes without salt. Unsalted popcorn and pretzels. Fat-free sweets. What foods are not  recommended? The items listed may not be a complete list. Talk with your dietitian about what dietary choices are best for you. Grains  Baked goods made with fat, such as croissants, muffins, or some breads. Dry pasta or rice meal packs. Vegetables  Creamed or fried vegetables. Vegetables in a cheese sauce. Regular canned vegetables (not low-sodium or reduced-sodium). Regular canned tomato sauce and paste (not low-sodium or reduced-sodium). Regular tomato and vegetable juice (not low-sodium or reduced-sodium). Angie Fava. Olives. Fruits  Canned fruit in a light or heavy syrup. Fried fruit. Fruit in cream or butter sauce. Meat and other protein foods  Fatty cuts of meat. Ribs. Fried meat. Berniece Salines. Sausage. Bologna and other processed lunch meats. Salami. Fatback. Hotdogs. Bratwurst. Salted nuts and seeds. Canned beans with added salt. Canned or smoked fish. Whole eggs or egg yolks. Chicken or [place] with skin. Dairy  Whole or 2% milk, cream, and half-and-half. Whole or full-fat cream cheese. Whole-fat or sweetened yogurt. Full-fat cheese. Nondairy creamers. Whipped toppings. Processed cheese and cheese spreads. Fats and oils  Butter. Stick margarine. Lard. Shortening. Ghee. Bacon fat. Tropical oils, such as coconut, palm kernel, or palm oil. Seasoning and other foods  Salted popcorn and pretzels. Onion salt, garlic salt, seasoned salt, table salt, and sea salt. Worcestershire sauce. Tartar sauce. Barbecue sauce. Teriyaki sauce. Soy sauce, including reduced-sodium. Steak sauce. Canned and packaged gravies. Fish sauce. Oyster sauce. Cocktail sauce. Horseradish that you find on the shelf. Ketchup. Mustard. Meat flavorings and tenderizers. Bouillon cubes. Hot sauce and Tabasco sauce. Premade or packaged marinades. Premade or packaged taco seasonings. Relishes. Regular salad dressings. Where to find more information:  National Heart, Lung, and [place]: https://wilson-eaton.com/  American Heart Association:  www.heart.org Summary  The DASH eating plan is a healthy eating plan that has been shown to reduce high blood pressure (hypertension).  It may also reduce your risk for type 2 diabetes, heart disease, and stroke.  With the DASH eating plan, you should limit salt (sodium) intake to 2,300 mg a day. If you have hypertension, you may need to reduce your sodium intake to 1,500 mg a day.  When on the DASH eating plan, aim to eat more fresh fruits and vegetables, whole grains, lean proteins, low-fat dairy, and heart-healthy fats.  Work with your health care provider or diet and nutrition specialist (dietitian) to adjust your eating plan to your individual calorie needs. This information is not intended to replace advice given to you by your health care provider. Make sure you discuss any questions you have with your health care provider. Document Released: 06/08/2011 Document Revised: 06/12/2016 Document Reviewed: 06/12/2016 Elsevier Interactive Patient Education  2017 [place] Maintenance for Postmenopausal Women Menopause is a normal process in which your reproductive ability comes to an end. This process happens gradually over a span of months to years, usually between the ages of 62 and 55. Menopause is complete when you have missed 12 consecutive menstrual periods. It is important to talk with your health care provider about some of the most common conditions that affect postmenopausal women, such as heart disease, cancer, and bone loss (osteoporosis). Adopting a healthy lifestyle and getting preventive care can help to promote your health and wellness. Those actions can also lower your chances of developing some of these common conditions. What should I know about menopause? During menopause, you may experience a number of symptoms, such as:  Moderate-to-severe hot flashes.  Night sweats.  Decrease in sex drive.  Mood  swings.  Headaches.  Tiredness.  Irritability.  Memory problems.  Insomnia. Choosing to treat or not to treat menopausal changes is an individual decision that you make with your health care provider. What should I know about hormone replacement therapy and supplements? Hormone therapy products are effective for treating symptoms that are associated with menopause, such as hot flashes and night sweats. Hormone replacement carries certain risks, especially as you become older. If you are thinking about using estrogen or estrogen with progestin treatments, discuss the benefits and risks with your health care provider. What should I know about heart disease and stroke? Heart disease, heart attack, and stroke become more likely as you age. This may be due, in part, to the hormonal changes that your body experiences during menopause. These can affect how your body processes dietary fats, triglycerides, and cholesterol. Heart attack and stroke are both medical emergencies. There are many things that you can do to help prevent heart disease and stroke:  Have your blood pressure checked at least every 1-2 years. High blood pressure causes heart disease and increases the risk of stroke.  If you are 64-4 years old, ask your health care provider if you should take aspirin to prevent a heart attack or a stroke.  Do not use any tobacco products, including cigarettes, chewing tobacco, or electronic cigarettes. If you need help quitting, ask your health care provider.  It is important to eat a healthy diet and maintain a healthy weight.  Be sure to include plenty of vegetables, fruits, low-fat dairy products, and lean protein.  Avoid eating foods that are high in solid fats, added sugars, or salt (sodium).  Get regular exercise. This is one of the most important things that you can do for your health.  Try to exercise for at least 150 minutes each week. The type  of exercise that you do should  increase your heart rate and make you sweat. This is known as moderate-intensity exercise.  Try to do strengthening exercises at least twice each week. Do these in addition to the moderate-intensity exercise.  Know your numbers.Ask your health care provider to check your cholesterol and your blood glucose. Continue to have your blood tested as directed by your health care provider. What should I know about cancer screening? There are several types of cancer. Take the following steps to reduce your risk and to catch any cancer development as early as possible. Breast Cancer  Practice breast self-awareness.  This means understanding how your breasts normally appear and feel.  It also means doing regular breast self-exams. Let your health care provider know about any changes, no matter how small.  If you are 67 or older, have a clinician do a breast exam (clinical breast exam or CBE) every year. Depending on your age, family history, and medical history, it may be recommended that you also have a yearly breast X-ray (mammogram).  If you have a family history of breast cancer, talk with your health care provider about genetic screening.  If you are at high risk for breast cancer, talk with your health care provider about having an MRI and a mammogram every year.  Breast cancer (BRCA) gene test is recommended for women who have family members with BRCA-related cancers. Results of the assessment will determine the need for genetic counseling and BRCA1 and for BRCA2 testing. BRCA-related cancers include these types:  Breast. This occurs in males or females.  Ovarian.  Tubal. This may also be called fallopian tube cancer.  Cancer of the abdominal or pelvic lining (peritoneal cancer).  Prostate.  Pancreatic. Cervical, Uterine, and Ovarian Cancer  Your health care provider may recommend that you be screened regularly for cancer of the pelvic organs. These include your ovaries, uterus, and  vagina. This screening involves a pelvic exam, which includes checking for microscopic changes to the surface of your cervix (Pap test).  For women ages 21-65, health care providers may recommend a pelvic exam and a Pap test every three years. For women ages 40-65, they may recommend the Pap test and pelvic exam, combined with testing for human papilloma virus (HPV), every five years. Some types of HPV increase your risk of cervical cancer. Testing for HPV may also be done on women of any age who have unclear Pap test results.  Other health care providers may not recommend any screening for nonpregnant women who are considered low risk for pelvic cancer and have no symptoms. Ask your health care provider if a screening pelvic exam is right for you.  If you have had past treatment for cervical cancer or a condition that could lead to cancer, you need Pap tests and screening for cancer for at least 20 years after your treatment. If Pap tests have been discontinued for you, your risk factors (such as having a new sexual partner) need to be reassessed to determine if you should start having screenings again. Some women have medical problems that increase the chance of getting cervical cancer. In these cases, your health care provider may recommend that you have screening and Pap tests more often.  If you have a family history of uterine cancer or ovarian cancer, talk with your health care provider about genetic screening.  If you have vaginal bleeding after reaching menopause, tell your health care provider.  There are currently no reliable tests available  to screen for ovarian cancer. Lung Cancer  Lung cancer screening is recommended for adults 80-52 years old who are at high risk for lung cancer because of a history of smoking. A yearly low-dose CT scan of the lungs is recommended if you:  Currently smoke.  Have a history of at least 30 pack-years of smoking and you currently smoke or have quit  within the past 15 years. A pack-year is smoking an average of one pack of cigarettes per day for one year. Yearly screening should:  Continue until it has been 15 years since you quit.  Stop if you develop a health problem that would prevent you from having lung cancer treatment. Colorectal Cancer  This type of cancer can be detected and can often be prevented.  Routine colorectal cancer screening usually begins at age 42 and continues through age 71.  If you have risk factors for colon cancer, your health care provider may recommend that you be screened at an earlier age.  If you have a family history of colorectal cancer, talk with your health care provider about genetic screening.  Your health care provider may also recommend using home test kits to check for hidden blood in your stool.  A small camera at the end of a tube can be used to examine your colon directly (sigmoidoscopy or colonoscopy). This is done to check for the earliest forms of colorectal cancer.  Direct examination of the colon should be repeated every 5-10 years until age 71. However, if early forms of precancerous polyps or small growths are found or if you have a family history or genetic risk for colorectal cancer, you may need to be screened more often. Skin Cancer  Check your skin from head to toe regularly.  Monitor any moles. Be sure to tell your health care provider:  About any new moles or changes in moles, especially if there is a change in a mole's shape or color.  If you have a mole that is larger than the size of a pencil eraser.  If any of your family members has a history of skin cancer, especially at a young age, talk with your health care provider about genetic screening.  Always use sunscreen. Apply sunscreen liberally and repeatedly throughout the day.  Whenever you are outside, protect yourself by wearing long sleeves, pants, a wide-brimmed hat, and sunglasses. What should I know about  osteoporosis? Osteoporosis is a condition in which bone destruction happens more quickly than new bone creation. After menopause, you may be at an increased risk for osteoporosis. To help prevent osteoporosis or the bone fractures that can happen because of osteoporosis, the following is recommended:  If you are 4-18 years old, get at least 1,000 mg of calcium and at least 600 mg of vitamin D per day.  If you are older than age 50 but younger than age 62, get at least 1,200 mg of calcium and at least 600 mg of vitamin D per day.  If you are older than age 11, get at least 1,200 mg of calcium and at least 800 mg of vitamin D per day. Smoking and excessive alcohol intake increase the risk of osteoporosis. Eat foods that are rich in calcium and vitamin D, and do weight-bearing exercises several times each week as directed by your health care provider. What should I know about how menopause affects my mental health? Depression may occur at any age, but it is more common as you become older. Common symptoms  of depression include:  Low or sad mood.  Changes in sleep patterns.  Changes in appetite or eating patterns.  Feeling an overall lack of motivation or enjoyment of activities that you previously enjoyed.  Frequent crying spells. Talk with your health care provider if you think that you are experiencing depression. What should I know about immunizations? It is important that you get and maintain your immunizations. These include:  Tetanus, diphtheria, and pertussis (Tdap) booster vaccine.  Influenza every year before the flu season begins.  Pneumonia vaccine.  Shingles vaccine. Your health care provider may also recommend other immunizations. This information is not intended to replace advice given to you by your health care provider. Make sure you discuss any questions you have with your health care provider. Document Released: 08/11/2005 Document Revised: 01/07/2016 Document  Reviewed: 03/23/2015 Elsevier Interactive Patient Education  2017 [place] Prevention in the Home Falls can cause injuries and can affect people from all age groups. There are many simple things that you can do to make your home safe and to help prevent falls. What can I do on the outside of my home?  Regularly repair the edges of walkways and driveways and fix any cracks.  Remove high doorway thresholds.  Trim any shrubbery on the main path into your home.  Use bright outdoor lighting.  Clear walkways of debris and clutter, including tools and rocks.  Regularly check that handrails are securely fastened and in good repair. Both sides of any steps should have handrails.  Install guardrails along the edges of any raised decks or porches.  Have leaves, snow, and ice cleared regularly.  Use sand or salt on walkways during winter months.  In the garage, clean up any spills right away, including grease or oil spills. What can I do in the bathroom?  Use night lights.  Install grab bars by the toilet and in the tub and shower. Do not use towel bars as grab bars.  Use non-skid mats or decals on the floor of the tub or shower.  If you need to sit down while you are in the shower, use a plastic, non-slip stool.  Keep the floor dry. Immediately clean up any water that spills on the floor.  Remove soap buildup in the tub or shower on a regular basis.  Attach bath mats securely with double-sided non-slip rug tape.  Remove throw rugs and other tripping hazards from the floor. What can I do in the bedroom?  Use night lights.  Make sure that a bedside light is easy to reach.  Do not use oversized bedding that drapes onto the floor.  Have a firm chair that has side arms to use for getting dressed.  Remove throw rugs and other tripping hazards from the floor. What can I do in the kitchen?  Clean up any spills right away.  Avoid walking on wet floors.  Place  frequently used items in easy-to-reach places.  If you need to reach for something above you, use a sturdy step stool that has a grab bar.  Keep electrical cables out of the way.  Do not use floor polish or wax that makes floors slippery. If you have to use wax, make sure that it is non-skid floor wax.  Remove throw rugs and other tripping hazards from the floor. What can I do in the stairways?  Do not leave any items on the stairs.  Make sure that there are handrails on both sides of  the stairs. Fix handrails that are broken or loose. Make sure that handrails are as long as the stairways.  Check any carpeting to make sure that it is firmly attached to the stairs. Fix any carpet that is loose or worn.  Avoid having throw rugs at the top or bottom of stairways, or secure the rugs with carpet tape to prevent them from moving.  Make sure that you have a light switch at the top of the stairs and the bottom of the stairs. If you do not have them, have them installed. What are some other fall prevention tips?  Wear closed-toe shoes that fit well and support your feet. Wear shoes that have rubber soles or low heels.  When you use a stepladder, make sure that it is completely opened and that the sides are firmly locked. Have someone hold the ladder while you are using it. Do not climb a closed stepladder.  Add color or contrast paint or tape to grab bars and handrails in your home. Place contrasting color strips on the first and last steps.  Use mobility aids as needed, such as canes, walkers, scooters, and crutches.  Turn on lights if it is dark. Replace any light bulbs that burn out.  Set up furniture so that there are clear paths. Keep the furniture in the same spot.  Fix any uneven floor surfaces.  Choose a carpet design that does not hide the edge of steps of a stairway.  Be aware of any and all pets.  Review your medicines with your healthcare provider. Some medicines can cause  dizziness or changes in blood pressure, which increase your risk of falling. Talk with your health care provider about other ways that you can decrease your risk of falls. This may include working with a physical therapist or trainer to improve your strength, balance, and endurance. This information is not intended to replace advice given to you by your health care provider. Make sure you discuss any questions you have with your health care provider. Document Released: 06/09/2002 Document Revised: 11/16/2015 Document Reviewed: 07/24/2014 Elsevier Interactive Patient Education  2017 [place] K. [name] M.D.

## 2016-11-22 ENCOUNTER — Ambulatory Visit (INDEPENDENT_AMBULATORY_CARE_PROVIDER_SITE_OTHER): Payer: PPO | Admitting: Internal Medicine

## 2016-11-22 ENCOUNTER — Encounter: Payer: Self-pay | Admitting: Internal Medicine

## 2016-11-22 VITALS — BP 110/80 | HR 71 | Temp 98.5°F | Ht 66.0 in | Wt 257.4 lb

## 2016-11-22 DIAGNOSIS — Z79899 Other long term (current) drug therapy: Secondary | ICD-10-CM

## 2016-11-22 DIAGNOSIS — I5032 Chronic diastolic (congestive) heart failure: Secondary | ICD-10-CM

## 2016-11-22 DIAGNOSIS — R0600 Dyspnea, unspecified: Secondary | ICD-10-CM | POA: Diagnosis not present

## 2016-11-22 DIAGNOSIS — R053 Chronic cough: Secondary | ICD-10-CM

## 2016-11-22 DIAGNOSIS — Z8639 Personal history of other endocrine, nutritional and metabolic disease: Secondary | ICD-10-CM

## 2016-11-22 DIAGNOSIS — Z0000 Encounter for general adult medical examination without abnormal findings: Secondary | ICD-10-CM | POA: Diagnosis not present

## 2016-11-22 DIAGNOSIS — Z09 Encounter for follow-up examination after completed treatment for conditions other than malignant neoplasm: Secondary | ICD-10-CM

## 2016-11-22 DIAGNOSIS — Z1159 Encounter for screening for other viral diseases: Secondary | ICD-10-CM

## 2016-11-22 DIAGNOSIS — R05 Cough: Secondary | ICD-10-CM

## 2016-11-22 DIAGNOSIS — R942 Abnormal results of pulmonary function studies: Secondary | ICD-10-CM | POA: Diagnosis not present
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
## 2016-11-22 NOTE — Patient Instructions (Addendum)
Glad you are doing  Better  .  Stay on the Claritin and Protonix at this time. Since your cough  Is better.   Will be contacted about referral to pulmonary because of the  abnormal pulmonary function tests.   Can make wellness  Appt with [name] .  Plan rov    2 mos .     [name] , Thank you for taking time to come for your Medicare Wellness Visit. I appreciate your ongoing commitment to your health goals. Please review the following plan we discussed and let me know if I can assist you in the future.   Please schedule an eye visit with the doctor of your choice   Will call and make an apt for your mammogram   Shingrix is a vaccine for the prevention of Shingles in Adults 50 and older.  If you are on Medicare, you can request a prescription from your doctor to be filled at a pharmacy.  Please check with your benefits regarding applicable copays or out of pocket expenses.  The Shingrix is given in 2 vaccines approx 8 weeks apart. You must receive the 2nd dose prior to 6 months from receipt of the first.   I have ordered your hepatitis C as a medicare guideline and can be drawn at your next blood I have ordered your microalbumin for the next blood draw   These are the goals we discussed: Goals    . Weight (lb) < 200 lb (90.7 kg)          Motivation is a 5 and family;  You feel better when you eat salads; cut back on high calories foods as you are doing  Check out  online nutrition programs as GumSearch.nl and http://vang.com/; fit69m; Look for foods with "whole" wheat; bran; oatmeal etc Shot at the farmer's markets in season for fresher choices  Watch for "hydrogenated" on the label of oils which are trans-fats.  Watch for "high fructose corn syrup" in snacks, yogurt or ketchup  Meats have less marbling; bright colored fruits and vegetables;  Canned; dump out liquid and wash vegetables. Be mindful of what we are eating  Portion control is essential to a health  weight! Sit down; take a break and enjoy your meal; take smaller bites; put the fork down between bites;  It takes 20 minutes to get full; so check in with your fullness cues and stop eating when you start to fill full   Look up online 25 go to small 100  Or 150 calorie snacks and bag them Challenge yourself with new recipes  Adding protein to you meals is good; try to allow for one or two starches  Or pick your daily goal               This is a list of the screening recommended for you and due dates:  Health Maintenance  Topic Date Due  .  Hepatitis C: One time screening is recommended by Center for Disease Control  (CDC) for  adults born from [birth year]through 1965.   01947-01-22 . Urine Protein Check  02/08/1956  . Eye exam for diabetics  07/03/2010  . Mammogram  04/08/2016  . Complete foot exam   06/10/2016  . Flu Shot  01/31/2017  . Hemoglobin A1C  05/16/2017  . Tetanus Vaccine  06/03/2019  . Colon Cancer Screening  08/05/2022  . DEXA scan (bone density measurement)  Completed  . Pneumonia vaccines  Completed  Educated regarding prediabetes and numbers;  A1c ranges from 5.8 to 6.5 or fasting Blood sugar > 115 -126; (126 is diabetic)   Risk: >45yo; family hx; overweight or obese; African American; Hispanic; Latino; American [place] American; [place]; history of diabetes when pregnant; or birth to a baby weighing over 9 lbs. Being less physically active than 30 minutes; 3 times a week;   Prevention; Losing a modest 7 to 8 lbs; If over 200 lbs; 10 to 14 lbs;  Choose healthier foods; colorful veggies; fish or lean meats; drinks water Reduce portion size Start exercising; 30 minutes of fast walking x 30 minutes per day/ 60 min for weight loss       DASH Eating Plan DASH stands for "Dietary Approaches to Stop Hypertension." The DASH eating plan is a healthy eating plan that has been shown to reduce high blood pressure (hypertension). It may also  reduce your risk for type 2 diabetes, heart disease, and stroke. The DASH eating plan may also help with weight loss. What are tips for following this plan? General guidelines   Avoid eating more than 2,300 mg (milligrams) of salt (sodium) a day. If you have hypertension, you may need to reduce your sodium intake to 1,500 mg a day.  Limit alcohol intake to no more than 1 drink a day for nonpregnant women and 2 drinks a day for men. One drink equals 12 oz of beer, 5 oz of wine, or 1 oz of hard liquor.  Work with your health care provider to maintain a healthy body weight or to lose weight. Ask what an ideal weight is for you.  Get at least 30 minutes of exercise that causes your heart to beat faster (aerobic exercise) most days of the week. Activities may include walking, swimming, or biking.  Work with your health care provider or diet and nutrition specialist (dietitian) to adjust your eating plan to your individual calorie needs. Reading food labels   Check food labels for the amount of sodium per serving. Choose foods with less than 5 percent of the Daily Value of sodium. Generally, foods with less than 300 mg of sodium per serving fit into this eating plan.  To find whole grains, look for the word "whole" as the first word in the ingredient list. Shopping   Buy products labeled as "low-sodium" or "no salt added."  Buy fresh foods. Avoid canned foods and premade or frozen meals. Cooking   Avoid adding salt when cooking. Use salt-free seasonings or herbs instead of table salt or sea salt. Check with your health care provider or pharmacist before using salt substitutes.  Do not fry foods. Cook foods using healthy methods such as baking, boiling, grilling, and broiling instead.  Cook with heart-healthy oils, such as olive, canola, soybean, or sunflower oil. Meal planning    Eat a balanced diet that includes:  5 or more servings of fruits and vegetables each day. At each meal, try  to fill half of your plate with fruits and vegetables.  Up to 6-8 servings of whole grains each day.  Less than 6 oz of lean meat, poultry, or fish each day. A 3-oz serving of meat is about the same size as a deck of cards. One egg equals 1 oz.  2 servings of low-fat dairy each day.  A serving of nuts, seeds, or beans 5 times each week.  Heart-healthy fats. Healthy fats called Omega-3 fatty acids are found in foods such as flaxseeds and coldwater  fish, like sardines, salmon, and mackerel.  Limit how much you eat of the following:  Canned or prepackaged foods.  Food that is high in trans fat, such as fried foods.  Food that is high in saturated fat, such as fatty meat.  Sweets, desserts, sugary drinks, and other foods with added sugar.  Full-fat dairy products.  Do not salt foods before eating.  Try to eat at least 2 vegetarian meals each week.  Eat more home-cooked food and less restaurant, buffet, and fast food.  When eating at a restaurant, ask that your food be prepared with less salt or no salt, if possible. What foods are recommended? The items listed may not be a complete list. Talk with your dietitian about what dietary choices are best for you. Grains  Whole-grain or whole-wheat bread. Whole-grain or whole-wheat pasta. Brown rice. [place]. Bulgur. Whole-grain and low-sodium cereals. Pita bread. Low-fat, low-sodium crackers. Whole-wheat flour tortillas. Vegetables  Fresh or frozen vegetables (raw, steamed, roasted, or grilled). Low-sodium or reduced-sodium tomato and vegetable juice. Low-sodium or reduced-sodium tomato sauce and tomato paste. Low-sodium or reduced-sodium canned vegetables. Fruits  All fresh, dried, or frozen fruit. Canned fruit in natural juice (without added sugar). Meat and other protein foods  Skinless chicken or [place]. Ground chicken or [place]. Pork with fat trimmed off. Fish and seafood. Egg whites. Dried beans, peas, or lentils. Unsalted  nuts, nut butters, and seeds. Unsalted canned beans. Lean cuts of beef with fat trimmed off. Low-sodium, lean deli meat. Dairy  Low-fat (1%) or fat-free (skim) milk. Fat-free, low-fat, or reduced-fat cheeses. Nonfat, low-sodium ricotta or cottage cheese. Low-fat or nonfat yogurt. Low-fat, low-sodium cheese. Fats and oils  Soft margarine without trans fats. Vegetable oil. Low-fat, reduced-fat, or light mayonnaise and salad dressings (reduced-sodium). Canola, safflower, olive, soybean, and sunflower oils. Avocado. Seasoning and other foods  Herbs. Spices. Seasoning mixes without salt. Unsalted popcorn and pretzels. Fat-free sweets. What foods are not recommended? The items listed may not be a complete list. Talk with your dietitian about what dietary choices are best for you. Grains  Baked goods made with fat, such as croissants, muffins, or some breads. Dry pasta or rice meal packs. Vegetables  Creamed or fried vegetables. Vegetables in a cheese sauce. Regular canned vegetables (not low-sodium or reduced-sodium). Regular canned tomato sauce and paste (not low-sodium or reduced-sodium). Regular tomato and vegetable juice (not low-sodium or reduced-sodium). Angie Fava. Olives. Fruits  Canned fruit in a light or heavy syrup. Fried fruit. Fruit in cream or butter sauce. Meat and other protein foods  Fatty cuts of meat. Ribs. Fried meat. Berniece Salines. Sausage. Bologna and other processed lunch meats. Salami. Fatback. Hotdogs. Bratwurst. Salted nuts and seeds. Canned beans with added salt. Canned or smoked fish. Whole eggs or egg yolks. Chicken or [place] with skin. Dairy  Whole or 2% milk, cream, and half-and-half. Whole or full-fat cream cheese. Whole-fat or sweetened yogurt. Full-fat cheese. Nondairy creamers. Whipped toppings. Processed cheese and cheese spreads. Fats and oils  Butter. Stick margarine. Lard. Shortening. Ghee. Bacon fat. Tropical oils, such as coconut, palm kernel, or palm oil. Seasoning and  other foods  Salted popcorn and pretzels. Onion salt, garlic salt, seasoned salt, table salt, and sea salt. Worcestershire sauce. Tartar sauce. Barbecue sauce. Teriyaki sauce. Soy sauce, including reduced-sodium. Steak sauce. Canned and packaged gravies. Fish sauce. Oyster sauce. Cocktail sauce. Horseradish that you find on the shelf. Ketchup. Mustard. Meat flavorings and tenderizers. Bouillon cubes. Hot sauce and Tabasco sauce. Premade or packaged  marinades. Premade or packaged taco seasonings. Relishes. Regular salad dressings. Where to find more information:  National Heart, Lung, and [place]: https://wilson-eaton.com/  American Heart Association: www.heart.org Summary  The DASH eating plan is a healthy eating plan that has been shown to reduce high blood pressure (hypertension). It may also reduce your risk for type 2 diabetes, heart disease, and stroke.  With the DASH eating plan, you should limit salt (sodium) intake to 2,300 mg a day. If you have hypertension, you may need to reduce your sodium intake to 1,500 mg a day.  When on the DASH eating plan, aim to eat more fresh fruits and vegetables, whole grains, lean proteins, low-fat dairy, and heart-healthy fats.  Work with your health care provider or diet and nutrition specialist (dietitian) to adjust your eating plan to your individual calorie needs. This information is not intended to replace advice given to you by your health care provider. Make sure you discuss any questions you have with your health care provider. Document Released: 06/08/2011 Document Revised: 06/12/2016 Document Reviewed: 06/12/2016 Elsevier Interactive Patient Education  2017 [place] Maintenance for Postmenopausal Women Menopause is a normal process in which your reproductive ability comes to an end. This process happens gradually over a span of months to years, usually between the ages of 26 and 32. Menopause is complete when you have missed 12  consecutive menstrual periods. It is important to talk with your health care provider about some of the most common conditions that affect postmenopausal women, such as heart disease, cancer, and bone loss (osteoporosis). Adopting a healthy lifestyle and getting preventive care can help to promote your health and wellness. Those actions can also lower your chances of developing some of these common conditions. What should I know about menopause? During menopause, you may experience a number of symptoms, such as:  Moderate-to-severe hot flashes.  Night sweats.  Decrease in sex drive.  Mood swings.  Headaches.  Tiredness.  Irritability.  Memory problems.  Insomnia. Choosing to treat or not to treat menopausal changes is an individual decision that you make with your health care provider. What should I know about hormone replacement therapy and supplements? Hormone therapy products are effective for treating symptoms that are associated with menopause, such as hot flashes and night sweats. Hormone replacement carries certain risks, especially as you become older. If you are thinking about using estrogen or estrogen with progestin treatments, discuss the benefits and risks with your health care provider. What should I know about heart disease and stroke? Heart disease, heart attack, and stroke become more likely as you age. This may be due, in part, to the hormonal changes that your body experiences during menopause. These can affect how your body processes dietary fats, triglycerides, and cholesterol. Heart attack and stroke are both medical emergencies. There are many things that you can do to help prevent heart disease and stroke:  Have your blood pressure checked at least every 1-2 years. High blood pressure causes heart disease and increases the risk of stroke.  If you are 66-91 years old, ask your health care provider if you should take aspirin to prevent a heart attack or a  stroke.  Do not use any tobacco products, including cigarettes, chewing tobacco, or electronic cigarettes. If you need help quitting, ask your health care provider.  It is important to eat a healthy diet and maintain a healthy weight.  Be sure to include plenty of vegetables, fruits, low-fat dairy products,  and lean protein.  Avoid eating foods that are high in solid fats, added sugars, or salt (sodium).  Get regular exercise. This is one of the most important things that you can do for your health.  Try to exercise for at least 150 minutes each week. The type of exercise that you do should increase your heart rate and make you sweat. This is known as moderate-intensity exercise.  Try to do strengthening exercises at least twice each week. Do these in addition to the moderate-intensity exercise.  Know your numbers.Ask your health care provider to check your cholesterol and your blood glucose. Continue to have your blood tested as directed by your health care provider. What should I know about cancer screening? There are several types of cancer. Take the following steps to reduce your risk and to catch any cancer development as early as possible. Breast Cancer  Practice breast self-awareness.  This means understanding how your breasts normally appear and feel.  It also means doing regular breast self-exams. Let your health care provider know about any changes, no matter how small.  If you are 54 or older, have a clinician do a breast exam (clinical breast exam or CBE) every year. Depending on your age, family history, and medical history, it may be recommended that you also have a yearly breast X-ray (mammogram).  If you have a family history of breast cancer, talk with your health care provider about genetic screening.  If you are at high risk for breast cancer, talk with your health care provider about having an MRI and a mammogram every year.  Breast cancer (BRCA) gene test is  recommended for women who have family members with BRCA-related cancers. Results of the assessment will determine the need for genetic counseling and BRCA1 and for BRCA2 testing. BRCA-related cancers include these types:  Breast. This occurs in males or females.  Ovarian.  Tubal. This may also be called fallopian tube cancer.  Cancer of the abdominal or pelvic lining (peritoneal cancer).  Prostate.  Pancreatic. Cervical, Uterine, and Ovarian Cancer  Your health care provider may recommend that you be screened regularly for cancer of the pelvic organs. These include your ovaries, uterus, and vagina. This screening involves a pelvic exam, which includes checking for microscopic changes to the surface of your cervix (Pap test).  For women ages 21-65, health care providers may recommend a pelvic exam and a Pap test every three years. For women ages 40-65, they may recommend the Pap test and pelvic exam, combined with testing for human papilloma virus (HPV), every five years. Some types of HPV increase your risk of cervical cancer. Testing for HPV may also be done on women of any age who have unclear Pap test results.  Other health care providers may not recommend any screening for nonpregnant women who are considered low risk for pelvic cancer and have no symptoms. Ask your health care provider if a screening pelvic exam is right for you.  If you have had past treatment for cervical cancer or a condition that could lead to cancer, you need Pap tests and screening for cancer for at least 20 years after your treatment. If Pap tests have been discontinued for you, your risk factors (such as having a new sexual partner) need to be reassessed to determine if you should start having screenings again. Some women have medical problems that increase the chance of getting cervical cancer. In these cases, your health care provider may recommend that you have screening  and Pap tests more often.  If you have a  family history of uterine cancer or ovarian cancer, talk with your health care provider about genetic screening.  If you have vaginal bleeding after reaching menopause, tell your health care provider.  There are currently no reliable tests available to screen for ovarian cancer. Lung Cancer  Lung cancer screening is recommended for adults 69-3 years old who are at high risk for lung cancer because of a history of smoking. A yearly low-dose CT scan of the lungs is recommended if you:  Currently smoke.  Have a history of at least 30 pack-years of smoking and you currently smoke or have quit within the past 15 years. A pack-year is smoking an average of one pack of cigarettes per day for one year. Yearly screening should:  Continue until it has been 15 years since you quit.  Stop if you develop a health problem that would prevent you from having lung cancer treatment. Colorectal Cancer  This type of cancer can be detected and can often be prevented.  Routine colorectal cancer screening usually begins at age 44 and continues through age 41.  If you have risk factors for colon cancer, your health care provider may recommend that you be screened at an earlier age.  If you have a family history of colorectal cancer, talk with your health care provider about genetic screening.  Your health care provider may also recommend using home test kits to check for hidden blood in your stool.  A small camera at the end of a tube can be used to examine your colon directly (sigmoidoscopy or colonoscopy). This is done to check for the earliest forms of colorectal cancer.  Direct examination of the colon should be repeated every 5-10 years until age 75. However, if early forms of precancerous polyps or small growths are found or if you have a family history or genetic risk for colorectal cancer, you may need to be screened more often. Skin Cancer  Check your skin from head to toe regularly.  Monitor  any moles. Be sure to tell your health care provider:  About any new moles or changes in moles, especially if there is a change in a mole's shape or color.  If you have a mole that is larger than the size of a pencil eraser.  If any of your family members has a history of skin cancer, especially at a young age, talk with your health care provider about genetic screening.  Always use sunscreen. Apply sunscreen liberally and repeatedly throughout the day.  Whenever you are outside, protect yourself by wearing long sleeves, pants, a wide-brimmed hat, and sunglasses. What should I know about osteoporosis? Osteoporosis is a condition in which bone destruction happens more quickly than new bone creation. After menopause, you may be at an increased risk for osteoporosis. To help prevent osteoporosis or the bone fractures that can happen because of osteoporosis, the following is recommended:  If you are 50-76 years old, get at least 1,000 mg of calcium and at least 600 mg of vitamin D per day.  If you are older than age 84 but younger than age 54, get at least 1,200 mg of calcium and at least 600 mg of vitamin D per day.  If you are older than age 55, get at least 1,200 mg of calcium and at least 800 mg of vitamin D per day. Smoking and excessive alcohol intake increase the risk of osteoporosis. Eat foods that are  rich in calcium and vitamin D, and do weight-bearing exercises several times each week as directed by your health care provider. What should I know about how menopause affects my mental health? Depression may occur at any age, but it is more common as you become older. Common symptoms of depression include:  Low or sad mood.  Changes in sleep patterns.  Changes in appetite or eating patterns.  Feeling an overall lack of motivation or enjoyment of activities that you previously enjoyed.  Frequent crying spells. Talk with your health care provider if you think that you are  experiencing depression. What should I know about immunizations? It is important that you get and maintain your immunizations. These include:  Tetanus, diphtheria, and pertussis (Tdap) booster vaccine.  Influenza every year before the flu season begins.  Pneumonia vaccine.  Shingles vaccine. Your health care provider may also recommend other immunizations. This information is not intended to replace advice given to you by your health care provider. Make sure you discuss any questions you have with your health care provider. Document Released: 08/11/2005 Document Revised: 01/07/2016 Document Reviewed: 03/23/2015 Elsevier Interactive Patient Education  2017 [place] Prevention in the Home Falls can cause injuries and can affect people from all age groups. There are many simple things that you can do to make your home safe and to help prevent falls. What can I do on the outside of my home?  Regularly repair the edges of walkways and driveways and fix any cracks.  Remove high doorway thresholds.  Trim any shrubbery on the main path into your home.  Use bright outdoor lighting.  Clear walkways of debris and clutter, including tools and rocks.  Regularly check that handrails are securely fastened and in good repair. Both sides of any steps should have handrails.  Install guardrails along the edges of any raised decks or porches.  Have leaves, snow, and ice cleared regularly.  Use sand or salt on walkways during winter months.  In the garage, clean up any spills right away, including grease or oil spills. What can I do in the bathroom?  Use night lights.  Install grab bars by the toilet and in the tub and shower. Do not use towel bars as grab bars.  Use non-skid mats or decals on the floor of the tub or shower.  If you need to sit down while you are in the shower, use a plastic, non-slip stool.  Keep the floor dry. Immediately clean up any water that spills on  the floor.  Remove soap buildup in the tub or shower on a regular basis.  Attach bath mats securely with double-sided non-slip rug tape.  Remove throw rugs and other tripping hazards from the floor. What can I do in the bedroom?  Use night lights.  Make sure that a bedside light is easy to reach.  Do not use oversized bedding that drapes onto the floor.  Have a firm chair that has side arms to use for getting dressed.  Remove throw rugs and other tripping hazards from the floor. What can I do in the kitchen?  Clean up any spills right away.  Avoid walking on wet floors.  Place frequently used items in easy-to-reach places.  If you need to reach for something above you, use a sturdy step stool that has a grab bar.  Keep electrical cables out of the way.  Do not use floor polish or wax that makes floors slippery. If you have  to use wax, make sure that it is non-skid floor wax.  Remove throw rugs and other tripping hazards from the floor. What can I do in the stairways?  Do not leave any items on the stairs.  Make sure that there are handrails on both sides of the stairs. Fix handrails that are broken or loose. Make sure that handrails are as long as the stairways.  Check any carpeting to make sure that it is firmly attached to the stairs. Fix any carpet that is loose or worn.  Avoid having throw rugs at the top or bottom of stairways, or secure the rugs with carpet tape to prevent them from moving.  Make sure that you have a light switch at the top of the stairs and the bottom of the stairs. If you do not have them, have them installed. What are some other fall prevention tips?  Wear closed-toe shoes that fit well and support your feet. Wear shoes that have rubber soles or low heels.  When you use a stepladder, make sure that it is completely opened and that the sides are firmly locked. Have someone hold the ladder while you are using it. Do not climb a closed  stepladder.  Add color or contrast paint or tape to grab bars and handrails in your home. Place contrasting color strips on the first and last steps.  Use mobility aids as needed, such as canes, walkers, scooters, and crutches.  Turn on lights if it is dark. Replace any light bulbs that burn out.  Set up furniture so that there are clear paths. Keep the furniture in the same spot.  Fix any uneven floor surfaces.  Choose a carpet design that does not hide the edge of steps of a stairway.  Be aware of any and all pets.  Review your medicines with your healthcare provider. Some medicines can cause dizziness or changes in blood pressure, which increase your risk of falling. Talk with your health care provider about other ways that you can decrease your risk of falls. This may include working with a physical therapist or trainer to improve your strength, balance, and endurance. This information is not intended to replace advice given to you by your health care provider. Make sure you discuss any questions you have with your health care provider. Document Released: 06/09/2002 Document Revised: 11/16/2015 Document Reviewed: 07/24/2014 Elsevier Interactive Patient Education  2017 [employer].

## 2016-11-29 DIAGNOSIS — Z1231 Encounter for screening mammogram for malignant neoplasm of breast: Secondary | ICD-10-CM | POA: Diagnosis not present

## 2016-11-29 LAB — HM MAMMOGRAPHY

## 2016-11-29 NOTE — Progress Notes (Signed)
Please get this lady in with me in next two weeks. If there is no held-space to use, please let me know I will have to make something up.

## 2016-12-01 ENCOUNTER — Encounter: Payer: Self-pay | Admitting: Internal Medicine

## 2016-12-01 DIAGNOSIS — R922 Inconclusive mammogram: Secondary | ICD-10-CM | POA: Diagnosis not present

## 2016-12-01 DIAGNOSIS — N6310 Unspecified lump in the right breast, unspecified quadrant: Secondary | ICD-10-CM | POA: Diagnosis not present

## 2016-12-04 ENCOUNTER — Encounter: Payer: Self-pay | Admitting: Family Medicine

## 2016-12-04 ENCOUNTER — Encounter: Payer: Self-pay | Admitting: Internal Medicine

## 2016-12-05 ENCOUNTER — Ambulatory Visit (INDEPENDENT_AMBULATORY_CARE_PROVIDER_SITE_OTHER): Payer: PPO | Admitting: Internal Medicine

## 2016-12-05 ENCOUNTER — Encounter: Payer: Self-pay | Admitting: Internal Medicine

## 2016-12-05 VITALS — BP 110/60 | HR 64 | Ht 66.0 in | Wt 259.2 lb

## 2016-12-05 DIAGNOSIS — Z79899 Other long term (current) drug therapy: Secondary | ICD-10-CM

## 2016-12-05 DIAGNOSIS — R0689 Other abnormalities of breathing: Secondary | ICD-10-CM

## 2016-12-05 DIAGNOSIS — R942 Abnormal results of pulmonary function studies: Secondary | ICD-10-CM

## 2016-12-05 DIAGNOSIS — R0600 Dyspnea, unspecified: Secondary | ICD-10-CM

## 2016-12-05 DIAGNOSIS — R0989 Other specified symptoms and signs involving the circulatory and respiratory systems: Secondary | ICD-10-CM

## 2016-12-05 DIAGNOSIS — M1712 Unilateral primary osteoarthritis, left knee: Secondary | ICD-10-CM | POA: Diagnosis not present

## 2016-12-05 NOTE — Patient Instructions (Signed)
ICD-9-CM ICD-10-CM   1. Dyspnea and respiratory abnormalities 786.09 R06.00     R06.89   2. Abnormal PFTs (pulmonary function tests) 794.2 R94.2   3. Bibasilar crackles 786.7 R09.89   4. On amiodarone therapy V58.69 Z79.899    Concern is for INterstitial lung disease  Plan Do HRCT - will call with results and decide next step

## 2016-12-05 NOTE — Progress Notes (Signed)
Subjective:    Patient ID: [name], female    DOB: 05-10-1946, 71 y.o.   MRN: [id number]  PCP [name], [name], MD  HPI  IOV 12/05/2016  Chief Complaint  Patient presents with  . PULMONARY CONSULT    PULMONARY CONSULT FOR SOB referring doctor is Dr. [name]. DME is [facility] patient sleeps 8 hours a night with her CPAP machine     71 year old obese femal. She was admitted 11/12/2016 through 11/15/2016 for acute on chronic diastolic congestive heart failure. She is known to have sleep apnea essential hypertension coronary artery disease status post PTCA, paroxysmal A. fib, type 2 diabetes. She tells me that around 6 years ago she was diagnosed to have sleep apnea and started on CPAP therapy. Around this time she developed insidious onset of shortness of breath. Since then it has been progressively has been present on exertion relieved by rest. Class II-III activities bring it on. There is associated cough for the last 3 years. Both symptoms are progressive and is currently moderate in severity. Cough quality is dry. There is no associated chest pain or any radiation. There is no associated wheezing or orthopnea. Dyspnea can get worse during episodes of heart failure or atrial fibrillation. She's been on amiodarone therapy for the last 3 months after failing Tiosyn. She status post ablation. After the admission in May 2018 for heart failure that is concerned that she might have interstitial lung disease that she's been referred here. She feels currently well diuresed   Last chest x-ray 11/14/2016: Personally visualized and it looks like she has interstitial markings as opposed to heart failure   Echocardiogram April 2018: Pulmonary artery systolic pressure of 53 mmHg associated with grade 2 diastolic dysfunction  Results for FLOREAN, HOOBLER (MRN [id number]) as of 12/05/2016 15:37  Ref. Range 11/03/2016 11:34  DLCO cor Latest Units: ml/min/mmHg 14.06  DLCO cor % pred Latest Units: % 50  Results  for JAZMINN, POMALES (MRN [id number]) as of 12/05/2016 15:37  Ref. Range 11/03/2016 11:34  TLC Latest Units: L 3.76  TLC % pred Latest Units: % 69  Results for LEMON, WHITACRE (MRN [id number]) as of 12/05/2016 15:37  Ref. Range 11/03/2016 11:34  FVC-Pre Latest Units: L 1.38  FVC-%Pred-Pre Latest Units: % 41  FEV1-Pre Latest Units: L 1.12  FEV1-%Pred-Pre Latest Units: % 44  Pre FEV1/FVC ratio Latest Units: % 81     has a past medical history of Anticoagulant long-term use; Anxiety; Arthritis; CAD (coronary artery disease) (6222,9798); CHF (congestive heart failure) ([place]); Chronic atrial fibrillation ([place]) (06/2007); Chronic kidney disease; Depression; Diplopia (06/19/2008); Dysrhythmia; Edema of lower extremity; History of acute inferior wall MI; History of CVA (cerebrovascular accident); Hyperlipidemia; Hypertension; Myocardial infarction [facility]) (9211,9417); OSA on CPAP; Pacemaker; Pneumonia (2014); Shortness of breath; Stroke [facility]) (4081,4481); TIA (transient ischemic attack); Transfusion history; and Unspecified hemorrhoids without mention of complication (8/56/3149).   reports that she quit smoking about 34 years ago. Her smoking use included Cigarettes. She started smoking about 38 years ago. She has a 5.00 pack-year smoking history. She has never used smokeless tobacco.  Past Surgical History:  Procedure Laterality Date  . APPENDECTOMY    . ATRIAL FIBRILLATION ABLATION N/A 09/29/2016   Procedure: Atrial Fibrillation Ablation;  Surgeon: Will Meredith Leeds, MD;  Location: [facility];  Service: Cardiovascular;  Laterality: N/A;  . back surgeries     x2-3 by Dr Trenton Gammon  . BACK SURGERY    . CHOLECYSTECTOMY  [place]  . CORONARY STENT PLACEMENT     C stent  . DOPPLER ECHOCARDIOGRAPHY  2009  . ELECTROPHYSIOLOGIC STUDY N/A 03/31/2016   Procedure: Cardioversion;  Surgeon: Evans Lance, MD;  Location: [place];  Service: Cardiovascular;   Laterality: N/A;  . ELECTROPHYSIOLOGIC STUDY N/A 08/04/2016   Procedure: Cardioversion;  Surgeon: Evans Lance, MD;  Location: [place] [facility];  Service: Cardiovascular;  Laterality: N/A;  . LEFT AND RIGHT HEART CATHETERIZATION WITH CORONARY ANGIOGRAM N/A 10/06/2013   Procedure: LEFT AND RIGHT HEART CATHETERIZATION WITH CORONARY ANGIOGRAM;  Surgeon: Troy Sine, MD;  Location: [facility] CATH LAB;  Service: Cardiovascular;  Laterality: N/A;  . permanent pacemaker  06/2007  . TEE WITHOUT CARDIOVERSION N/A 09/29/2016   Procedure: TRANSESOPHAGEAL ECHOCARDIOGRAM (TEE);  Surgeon: Jerline Pain, MD;  Location: [facility] ENDOSCOPY;  Service: Cardiovascular;  Laterality: N/A;  . TOTAL ABDOMINAL HYSTERECTOMY  1984   2 surgeries endometriosis bso    Allergies  Allergen Reactions  . Adhesive [Tape]     Allergic to EKG stickers and defibrillation pads.    Immunization History  Administered Date(s) Administered  . Influenza Split 04/18/2011, 05/06/2012  . Influenza Whole 07/04/2007, 06/02/2009, 03/01/2010  . Influenza, High Dose Seasonal PF 04/03/2014, 04/18/2016  . Influenza,inj,Quad PF,36+ Mos 03/07/2013, 04/06/2015  . Pneumococcal Conjugate-13 09/24/2013  . Pneumococcal Polysaccharide-23 07/03/2005, 07/04/2007, 04/18/2011  . Td 06/02/2009  . Zoster 07/03/2006    Family History  Problem Relation Age of Onset  . Suicidality Father        suicide death pt was 3 yrs  . Arrhythmia Mother   . Hypertension Mother   . Diabetes Mother   . Heart attack Brother 19  . Heart disease Paternal Aunt   . Prostate cancer Maternal Grandfather   . Diabetes Paternal Grandfather        fathers side of the family     Current Outpatient Prescriptions:  .  acetaminophen (TYLENOL) 325 MG tablet, Take 2 tablets (650 mg total) by mouth every 4 (four) hours as needed for headache or mild pain. (Patient taking differently: Take 650 mg by mouth every morning. ), Disp: , Rfl:  .  amiodarone (PACERONE) 200 MG tablet, Take 1  tablet (200 mg total) by mouth daily., Disp: 30 tablet, Rfl: 11 .  apixaban (ELIQUIS) 5 MG TABS tablet, Take 1 tablet (5 mg total) by mouth 2 (two) times daily., Disp: 60 tablet, Rfl: 6 .  Ascorbic Acid (VITAMIN C) 1000 MG tablet, Take 1,000 mg by mouth daily., Disp: , Rfl:  .  atorvastatin (LIPITOR) 40 MG tablet, TAKE 1 TABLET BY MOUTH EVERY DAY, Disp: 90 tablet, Rfl: 2 .  Calcium Carbonate-Vitamin D (CALCIUM 600+D PO), Take 1 tablet by mouth daily., Disp: , Rfl:  .  Cyanocobalamin (B-12) 5000 MCG CAPS, Take 5,000 mcg by mouth at bedtime. , Disp: , Rfl:  .  diltiazem (CARDIZEM CD) 120 MG 24 hr capsule, Take 1 capsule (120 mg total) by mouth daily., Disp: 30 capsule, Rfl: 11 .  diphenhydramine-acetaminophen (TYLENOL PM) 25-500 MG TABS tablet, Take 2 tablets by mouth at bedtime as needed (pain/sleep)., Disp: , Rfl:  .  FLUoxetine (PROZAC) 20 MG capsule, TAKE ONE CAPSULE BY MOUTH EVERY MORNING, Disp: 90 capsule, Rfl: 1 .  fluticasone (FLONASE) 50 MCG/ACT nasal spray, Place 1 spray into both nostrils daily as needed for allergies or rhinitis., Disp: , Rfl:  .  furosemide (LASIX) 40 MG  tablet, TAKE 2 TABLETS (80MG ) BY MOUTH IN THE AM AND 1 TABLET (40MG ) BY MOUTH IN THE PM, Disp: 270 tablet, Rfl: 3 .  loratadine (CLARITIN) 10 MG tablet, Take 1 tablet (10 mg total) by mouth daily., Disp: 30 tablet, Rfl: 0 .  Lysine HCl 1000 MG TABS, Take 1,000 mg by mouth daily. , Disp: , Rfl:  .  metoprolol succinate (TOPROL-XL) 50 MG 24 hr tablet, Take 1 tablet (50 mg total) by mouth 2 (two) times daily. Take with or immediately following a meal., Disp: 60 tablet, Rfl: 11 .  Multiple Vitamin (MULTIVITAMIN WITH MINERALS) TABS tablet, Take 1 tablet by mouth daily. Centrum, Disp: , Rfl:  .  nitroGLYCERIN (NITROSTAT) 0.4 MG SL tablet, Place 1 tablet (0.4 mg total) under the tongue every 5 (five) minutes x 3 doses as needed for chest pain., Disp: 25 tablet, Rfl: 6 .  pantoprazole (PROTONIX) 40 MG tablet, Take 1 tablet (40 mg  total) by mouth daily., Disp: 30 tablet, Rfl: 0 .  Polyethyl Glycol-Propyl Glycol (SYSTANE OP), Place 1 drop into both eyes daily as needed (dry eyes)., Disp: , Rfl:  .  potassium chloride SA (KLOR-CON M20) 20 MEQ tablet, Take 1 tablet (20 mEq total) by mouth daily. (Patient taking differently: Take 20 mEq by mouth 2 (two) times daily. ), Disp: 180 tablet, Rfl: 3 .  PRESCRIPTION MEDICATION, Inhale into the lungs at bedtime. CPAP, Disp: , Rfl:  .  vitamin E 1000 UNIT capsule, Take 1,000 Units by mouth daily., Disp: , Rfl:     Walking desaturation test on 12/05/2016 185 feet x 3 laps on ROOM AIR with head probe:  did not desaturate. Rest pulse ox was 96%, final pulse ox was 97%. HR response 79/min at rest to 7/min at peak exertion. Stopped once after 2ndlap due to dyspnea - pulse ox 93%   Review of Systems  Constitutional: Negative for fever and unexpected weight change.  HENT: Positive for postnasal drip and sinus pressure. Negative for congestion, dental problem, ear pain, nosebleeds, rhinorrhea, sneezing, sore throat and trouble swallowing.   Eyes: Negative for redness and itching.  Respiratory: Positive for cough and shortness of breath. Negative for chest tightness and wheezing.   Cardiovascular: Negative for palpitations and leg swelling.  Gastrointestinal: Negative for nausea and vomiting.  Genitourinary: Negative for dysuria.  Musculoskeletal: Negative for joint swelling.  Skin: Negative for rash.  Allergic/Immunologic: Negative.  Negative for environmental allergies, food allergies and immunocompromised state.  Neurological: Negative for headaches.  Hematological: Does not bruise/bleed easily.  Psychiatric/Behavioral: Negative for dysphoric mood. The patient is not nervous/anxious.        Objective:   Physical Exam  Constitutional: She is oriented to person, place, and time. She appears well-developed and well-nourished. No distress.  HENT:  Head: Normocephalic and atraumatic.    Right Ear: External ear normal.  Left Ear: External ear normal.  Mouth/Throat: Oropharynx is clear and moist. No oropharyngeal exudate.  Post nasal drip +  Eyes: Conjunctivae and EOM are normal. Pupils are equal, round, and reactive to light. Right eye exhibits no discharge. Left eye exhibits no discharge. No scleral icterus.  Neck: Normal range of motion. Neck supple. No JVD present. No tracheal deviation present. No thyromegaly present.  Cardiovascular: Normal rate, regular rhythm, normal heart sounds and intact distal pulses.  Exam reveals no gallop and no friction rub.   No murmur heard. Pulmonary/Chest: Effort normal. No respiratory distress. She has no wheezes. She has rales. She exhibits no  tenderness.  Abdominal: Soft. Bowel sounds are normal. She exhibits no distension and no mass. There is no tenderness. There is no rebound and no guarding.  Musculoskeletal: Normal range of motion. She exhibits no edema or tenderness.  Lymphadenopathy:    She has no cervical adenopathy.  Neurological: She is alert and oriented to person, place, and time. She has normal reflexes. No cranial nerve deficit. She exhibits normal muscle tone. Coordination normal.  Skin: Skin is warm and dry. No rash noted. She is not diaphoretic. No erythema. No pallor.  Psychiatric: She has a normal mood and affect. Her behavior is normal. Judgment and thought content normal.  Vitals reviewed.   Vitals:   12/05/16 1508  BP: 110/60  Pulse: 64  SpO2: 92%  Weight: 259 lb 3.2 oz (117.6 kg)  Height: 5\' 6"  (1.676 m)         Assessment & Plan:     ICD-9-CM ICD-10-CM   1. Dyspnea and respiratory abnormalities 786.09 R06.00 CT CHEST HIGH RESOLUTION    R06.89   2. Abnormal PFTs (pulmonary function tests) 794.2 R94.2 CT CHEST HIGH RESOLUTION  3. Bibasilar crackles 786.7 R09.89 CT CHEST HIGH RESOLUTION  4. On amiodarone therapy V58.69 Z79.899 CT CHEST HIGH RESOLUTION    Her insidious onset of chronic dyspnea and  cough that is progressive associated with interstitial markings and a chest x-ray along with desaturation partially with exertion today suggests that she might have interstitial lung disease. The best thing is to get a high-resolution CT chest. If this does show interstitial lung disease we'll get autoimmune panel. The autoimmune panel is negative then based on [employer] of chest physicians interstitial lung disease questionnaire and the pattern on CT she might be a candidate for surgical lung biopsy   She is agreeable to this plan   Dr. Brand Males, M.D., [facility].C.P Pulmonary and Critical Care Medicine Staff Physician [facility] Pager: [phone], If no answer or between  15:00h - 7:00h: call [phone]  12/05/2016 3:57 PM

## 2016-12-12 ENCOUNTER — Encounter: Payer: Self-pay | Admitting: Cardiology

## 2016-12-12 ENCOUNTER — Telehealth: Payer: Self-pay | Admitting: Internal Medicine

## 2016-12-12 ENCOUNTER — Ambulatory Visit (INDEPENDENT_AMBULATORY_CARE_PROVIDER_SITE_OTHER): Payer: PPO | Admitting: Cardiology

## 2016-12-12 VITALS — BP 132/70 | HR 72 | Ht 66.0 in | Wt 255.4 lb

## 2016-12-12 DIAGNOSIS — I5032 Chronic diastolic (congestive) heart failure: Secondary | ICD-10-CM

## 2016-12-12 DIAGNOSIS — G4733 Obstructive sleep apnea (adult) (pediatric): Secondary | ICD-10-CM | POA: Diagnosis not present

## 2016-12-12 DIAGNOSIS — I495 Sick sinus syndrome: Secondary | ICD-10-CM

## 2016-12-12 DIAGNOSIS — I480 Paroxysmal atrial fibrillation: Secondary | ICD-10-CM

## 2016-12-12 LAB — CUP PACEART INCLINIC DEVICE CHECK
Battery Impedance: 1300 Ohm
Battery Voltage: 2.79 V
Date Time Interrogation Session: 20180612103444
Implantable Lead Implant Date: 20081211
Implantable Lead Implant Date: 20081211
Implantable Lead Location: 753859
Implantable Lead Location: 753860
Implantable Pulse Generator Implant Date: 20081211
Lead Channel Impedance Value: 428 Ohm
Lead Channel Impedance Value: 473 Ohm
Lead Channel Pacing Threshold Amplitude: 0.5 V
Lead Channel Pacing Threshold Amplitude: 1 V
Lead Channel Pacing Threshold Pulse Width: 0.4 ms
Lead Channel Pacing Threshold Pulse Width: 1 ms
Lead Channel Sensing Intrinsic Amplitude: 1.3 mV
Lead Channel Sensing Intrinsic Amplitude: 12 mV
Lead Channel Setting Pacing Amplitude: 2 V
Lead Channel Setting Pacing Amplitude: 2.5 V
Lead Channel Setting Pacing Pulse Width: 1 ms
Lead Channel Setting Sensing Sensitivity: 2 mV
Pulse Gen Model: 5826
Pulse Gen Serial Number: 1999089

## 2016-12-12 NOTE — Patient Instructions (Signed)
Medication Instructions:    Your physician recommends that you continue on your current medications as directed. Please refer to the Current Medication list given to you today.  - If you need a refill on your cardiac medications before your next appointment, please call your pharmacy.   Labwork:  None ordered  Testing/Procedures:  None ordered  Follow-Up:  Your physician recommends that you schedule a follow-up appointment in: 3 months with Dr. Curt Bears.  Thank you for choosing [facility]!!   Trinidad Curet, RN [phone]  Any Other Special Instructions Will Be Listed Below (If Applicable).

## 2016-12-12 NOTE — Progress Notes (Signed)
Electrophysiology Office Note   Date:  12/12/2016   ID:  [name], DOB 1946-02-19, MRN [id number]  PCP:  [name], MD  Primary Electrophysiologist: Gaye Alken, MD    Chief Complaint  Patient presents with  . Pacemaker Check    Persistent Afib     History of Present Illness: [name] is a 71 y.o. female who presents today for electrophysiology evaluation.   She has a history of coronary artery disease status post stent to the RCA in 2004, CHF, atrial fibrillation with tachybradycardia syndrome status post pacemaker, CKD, hypertension, hyperlipidemia, inferior wall MI, sleep apnea on CPAP, stroke. She was doing well with her atrial fibrillation maintaining sinus rhythm on CPAP and dofetilide until this past summer where she developed an upper airway infection and required cardioversion. She subsequently developed atrial fibrillation 2 months ago. Had AF ablation 09/29/16.   Today, denies symptoms of palpitations, chest pain, shortness of breath, orthopnea, PND, lower extremity edema, claudication, dizziness, presyncope, syncope, bleeding, or neurologic sequela. The patient is tolerating medications without difficulties and is otherwise without complaint today. She has not noted any episodes of atrial fibrillation. She has no major complaints.   Past Medical History:  Diagnosis Date  . Anticoagulant long-term use    pradaxa  . Anxiety   . Arthritis    low back & both knees, Spondylolisthesis    . CAD (coronary artery disease) 9562,1308   post PTCA with bare-metal stenting to mid RCA in December 2004     . CHF (congestive heart failure) ([place])   . Chronic atrial fibrillation ([place]) 06/2007   Tachybradycardia pacemaker  . Chronic kidney disease    10% function - ?R, other kidney is compensating    . Depression   . Diplopia 06/19/2008   Qualifier: Diagnosis of  By: [name] MD, [name]   . Dysrhythmia    ATRIAL FIBRILATION  . Edema of lower extremity     . History of acute inferior wall MI   . History of CVA (cerebrovascular accident)   . Hyperlipidemia   . Hypertension   . Myocardial infarction ([place]) [id number]  . OSA on CPAP    last test- 2010  . Pacemaker   . Pneumonia 2014   tx. ----  [facility]  . Shortness of breath   . Stroke ([place]) [id number]  . TIA (transient ischemic attack)   . Transfusion history   . Unspecified hemorrhoids without mention of complication 6/57/8469   Colonoscopy--Dr. Carlean Purl    Past Surgical History:  Procedure Laterality Date  . APPENDECTOMY    . ATRIAL FIBRILLATION ABLATION N/A 09/29/2016   Procedure: Atrial Fibrillation Ablation;  Surgeon: Amali Uhls Meredith Leeds, MD;  Location: [facility];  Service: Cardiovascular;  Laterality: N/A;  . back surgeries     x2-3 by Dr Trenton Gammon  . BACK SURGERY    . CHOLECYSTECTOMY  1994  . CORONARY ANGIOPLASTY WITH STENT PLACEMENT  1998  . CORONARY STENT PLACEMENT     C stent  . DOPPLER ECHOCARDIOGRAPHY  2009  . ELECTROPHYSIOLOGIC STUDY N/A 03/31/2016   Procedure: Cardioversion;  Surgeon: Evans Lance, MD;  Location: [facility];  Service: Cardiovascular;  Laterality: N/A;  . ELECTROPHYSIOLOGIC STUDY N/A 08/04/2016   Procedure: Cardioversion;  Surgeon: Evans Lance, MD;  Location: [facility];  Service: Cardiovascular;  Laterality: N/A;  . LEFT AND RIGHT HEART CATHETERIZATION WITH CORONARY ANGIOGRAM N/A 10/06/2013   Procedure: LEFT AND RIGHT  HEART CATHETERIZATION WITH CORONARY ANGIOGRAM;  Surgeon: Troy Sine, MD;  Location: [facility] CATH LAB;  Service: Cardiovascular;  Laterality: N/A;  . permanent pacemaker  06/2007  . TEE WITHOUT CARDIOVERSION N/A 09/29/2016   Procedure: TRANSESOPHAGEAL ECHOCARDIOGRAM (TEE);  Surgeon: Jerline Pain, MD;  Location: [facility] ENDOSCOPY;  Service: Cardiovascular;  Laterality: N/A;  . TOTAL ABDOMINAL HYSTERECTOMY  1984   2 surgeries endometriosis bso     Current Outpatient Prescriptions  Medication Sig Dispense Refill   . acetaminophen (TYLENOL) 325 MG tablet Take 2 tablets (650 mg total) by mouth every 4 (four) hours as needed for headache or mild pain. (Patient taking differently: Take 650 mg by mouth every morning. )    . amiodarone (PACERONE) 200 MG tablet Take 1 tablet (200 mg total) by mouth daily. 30 tablet 11  . apixaban (ELIQUIS) 5 MG TABS tablet Take 1 tablet (5 mg total) by mouth 2 (two) times daily. 60 tablet 6  . Ascorbic Acid (VITAMIN C) 1000 MG tablet Take 1,000 mg by mouth daily.    [name] atorvastatin (LIPITOR) 40 MG tablet TAKE 1 TABLET BY MOUTH EVERY DAY 90 tablet 2  . Calcium Carbonate-Vitamin D (CALCIUM 600+D PO) Take 1 tablet by mouth daily.    . Cyanocobalamin (B-12) 5000 MCG CAPS Take 5,000 mcg by mouth at bedtime.     [name] diltiazem (CARDIZEM CD) 120 MG 24 hr capsule Take 1 capsule (120 mg total) by mouth daily. 30 capsule 11  . diphenhydramine-acetaminophen (TYLENOL PM) 25-500 MG TABS tablet Take 2 tablets by mouth at bedtime as needed (pain/sleep).    [name] FLUoxetine (PROZAC) 20 MG capsule TAKE ONE CAPSULE BY MOUTH EVERY MORNING 90 capsule 1  . fluticasone (FLONASE) 50 MCG/ACT nasal spray Place 1 spray into both nostrils daily as needed for allergies or rhinitis.    . furosemide (LASIX) 40 MG tablet TAKE 2 TABLETS (80MG ) BY MOUTH IN THE AM AND 1 TABLET (40MG ) BY MOUTH IN THE PM 270 tablet 3  . loratadine (CLARITIN) 10 MG tablet Take 1 tablet (10 mg total) by mouth daily. 30 tablet 0  . Lysine HCl 1000 MG TABS Take 1,000 mg by mouth daily.     . metoprolol succinate (TOPROL-XL) 50 MG 24 hr tablet Take 1 tablet (50 mg total) by mouth 2 (two) times daily. Take with or immediately following a meal. 60 tablet 11  . Multiple Vitamin (MULTIVITAMIN WITH MINERALS) TABS tablet Take 1 tablet by mouth daily. Centrum    . nitroGLYCERIN (NITROSTAT) 0.4 MG SL tablet Place 1 tablet (0.4 mg total) under the tongue every 5 (five) minutes x 3 doses as needed for chest pain. 25 tablet 6  . pantoprazole (PROTONIX) 40  MG tablet Take 1 tablet (40 mg total) by mouth daily. 30 tablet 0  . Polyethyl Glycol-Propyl Glycol (SYSTANE OP) Place 1 drop into both eyes daily as needed (dry eyes).    . potassium chloride SA (KLOR-CON M20) 20 MEQ tablet Take 1 tablet (20 mEq total) by mouth daily. (Patient taking differently: Take 20 mEq by mouth 2 (two) times daily. ) 180 tablet 3  . PRESCRIPTION MEDICATION Inhale into the lungs at bedtime. CPAP    . vitamin E 1000 UNIT capsule Take 1,000 Units by mouth daily.     No current facility-administered medications for this visit.     Allergies:   Adhesive [tape]   Social History:  The patient  reports that she quit smoking about 34 years ago. Her smoking use  included Cigarettes. She started smoking about 38 years ago. She has a 5.00 pack-year smoking history. She has never used smokeless tobacco. She reports that she drinks alcohol. She reports that she does not use drugs.   Family History:  The patient's family history includes Arrhythmia in her mother; Diabetes in her mother and paternal grandfather; Heart attack (age of onset: 76) in her brother; Heart disease in her paternal aunt; Hypertension in her mother; Prostate cancer in her maternal grandfather; Suicidality in her father.    ROS:  Please see the history of present illness.   Otherwise, review of systems is positive for none.   All other systems are reviewed and negative.     PHYSICAL EXAM: VS:  BP 132/70   Pulse 72   Ht 5\' 6"  (1.676 m)   Wt 255 lb 6.4 oz (115.8 kg)   BMI 41.22 kg/m  , BMI Body mass index is 41.22 kg/m. GEN: Well nourished, well developed, in no acute distress  HEENT: normal  Neck: no JVD, carotid bruits, or masses Cardiac: RRR; no murmurs, rubs, or gallops,no edema  Respiratory:  clear to auscultation bilaterally, normal work of breathing GI: soft, nontender, nondistended, + BS MS: no deformity or atrophy  Skin: warm and dry, device site well healed Neuro:  Strength and sensation are  intact Psych: euthymic mood, full affect  EKG:  EKG is ordered today. Personal review of the ekg ordered shows sinus rhythm, rate 63   Personal review of the device interrogation today. Results in [place]: 10/16/2016: ALT 26; TSH 4.390 11/12/2016: B Natriuretic Peptide 139.0; Hemoglobin 12.2; Platelets 262 11/13/2016: Magnesium 2.1 11/15/2016: BUN 18; Creatinine, Ser 1.12; Potassium 5.1; Sodium 138    Lipid Panel     Component Value Date/Time   CHOL 116 10/16/2016 0850   TRIG 139 10/16/2016 0850   HDL 42 10/16/2016 0850   CHOLHDL 2.8 10/16/2016 0850   CHOLHDL 4.5 03/31/2016 0248   VLDL 29 03/31/2016 0248   LDLCALC 46 10/16/2016 0850     Wt Readings from Last 3 Encounters:  12/12/16 255 lb 6.4 oz (115.8 kg)  12/05/16 259 lb 3.2 oz (117.6 kg)  11/22/16 257 lb 6.4 oz (116.8 kg)      Other studies Reviewed: Additional studies/ records that were reviewed today include: TTE 03/28/16  Review of the above records today demonstrates:  - Left ventricle: The cavity size was normal. Wall thickness was   increased in a pattern of mild LVH. Systolic function was normal.   The estimated ejection fraction was in the range of 55% to 60%.   Indeterminant diastolic function (atrial fibrillation). Wall   motion was normal; there were no regional wall motion   abnormalities. - Aortic valve: There was no stenosis. - Mitral valve: Mildly calcified annulus. There was no significant   regurgitation. - Left atrium: The atrium was mildly dilated. - Right ventricle: The cavity size was normal. Pacer wire or   catheter noted in right ventricle. Systolic function was normal. - Tricuspid valve: Peak RV-RA gradient (S): 22 mm Hg. - Pulmonary arteries: PA peak pressure: 30 mm Hg (S). - Systemic veins: IVC measured 2.3 cm with normal respirophasic   variation, suggesting RA pressure 8 mmHg.   ASSESSMENT AND PLAN:  1.  Paroxysmal atrial fibrillation: On Eliquis status post AF  ablation on 09/29/16.She has been doing well since her ablation. Has not had any further issues. We'll continue Eliquis. No medication changes.  This patients  CHA2DS2-VASc Score and unadjusted Ischemic Stroke Rate (% per year) is equal to 3.2 % stroke rate/year from a score of 3  Above score calculated as 1 point each if present [CHF, HTN, DM, Vascular=MI/PAD/Aortic Plaque, Age if 65-74, or Female] Above score calculated as 2 points each if present [Age > 75, or Stroke/TIA/TE]   2. Hypertension: Currently well controlled, no changes  3. Obesity: weight loss encouraged  4. Chronic diastolic heart failure: No symptoms of heart failure or volume overload  5. Obstructive sleep apnea: on CPAP    Current medicines are reviewed at length with the patient today.   The patient does not have concerns regarding her medicines.  The following changes were made today:  none  Labs/ tests ordered today include: CBC, BMP Orders Placed This Encounter  Procedures  . EKG 12-Lead     Disposition:   FU with Kazimierz Springborn 3 months  Signed, Kaedyn Belardo Meredith Leeds, MD  12/12/2016 10:16 AM     [facility] [address] [place] [address] [phone] (office) [phone] (fax)

## 2016-12-12 NOTE — Telephone Encounter (Signed)
Patient needs a refill on Loratadine 10 mg and Pantoprazole 40 mg tab.    Pharmacy: [facility]

## 2016-12-13 ENCOUNTER — Other Ambulatory Visit: Payer: Self-pay | Admitting: Emergency Medicine

## 2016-12-13 MED ORDER — PANTOPRAZOLE SODIUM 40 MG PO TBEC: 40.0000 mg | DELAYED_RELEASE_TABLET | Freq: Every day | ORAL | 2 refills | Status: DC

## 2016-12-13 MED ORDER — LORATADINE 10 MG PO TABS: 10.0000 mg | ORAL_TABLET | Freq: Every day | ORAL | 5 refills | Status: DC

## 2016-12-13 NOTE — Telephone Encounter (Signed)
° ° °  Pt call to ask if the doctor will refill the meds she requested

## 2016-12-13 NOTE — Telephone Encounter (Signed)
Prescriptions has been sent in

## 2016-12-13 NOTE — Telephone Encounter (Signed)
Ok to rx  prtoonix  Generic for 3 months   And loratidine for  6 months

## 2016-12-13 NOTE — Telephone Encounter (Signed)
Patient would like to know if you are okay with refilling these medications. Please advise.

## 2016-12-19 ENCOUNTER — Encounter: Payer: PPO | Admitting: Cardiology

## 2016-12-19 ENCOUNTER — Inpatient Hospital Stay: Admission: RE | Admit: 2016-12-19 | Payer: PPO | Source: Ambulatory Visit

## 2016-12-20 ENCOUNTER — Other Ambulatory Visit: Payer: Self-pay | Admitting: Internal Medicine

## 2016-12-22 ENCOUNTER — Other Ambulatory Visit: Payer: Self-pay | Admitting: Cardiovascular Disease

## 2017-01-05 DIAGNOSIS — H40053 Ocular hypertension, bilateral: Secondary | ICD-10-CM | POA: Diagnosis not present

## 2017-01-05 DIAGNOSIS — H25813 Combined forms of age-related cataract, bilateral: Secondary | ICD-10-CM | POA: Diagnosis not present

## 2017-01-05 DIAGNOSIS — M25562 Pain in left knee: Secondary | ICD-10-CM | POA: Diagnosis not present

## 2017-01-15 DIAGNOSIS — H2512 Age-related nuclear cataract, left eye: Secondary | ICD-10-CM | POA: Diagnosis not present

## 2017-01-15 DIAGNOSIS — H25812 Combined forms of age-related cataract, left eye: Secondary | ICD-10-CM | POA: Diagnosis not present

## 2017-01-15 DIAGNOSIS — H2511 Age-related nuclear cataract, right eye: Secondary | ICD-10-CM | POA: Diagnosis not present

## 2017-01-18 ENCOUNTER — Ambulatory Visit (INDEPENDENT_AMBULATORY_CARE_PROVIDER_SITE_OTHER)
Admission: RE | Admit: 2017-01-18 | Discharge: 2017-01-18 | Disposition: A | Discharge status: Home / Self Care | Payer: PPO | Source: Ambulatory Visit | Attending: Internal Medicine | Admitting: Internal Medicine

## 2017-01-18 DIAGNOSIS — R0689 Other abnormalities of breathing: Secondary | ICD-10-CM | POA: Diagnosis not present

## 2017-01-18 DIAGNOSIS — R0989 Other specified symptoms and signs involving the circulatory and respiratory systems: Secondary | ICD-10-CM

## 2017-01-18 DIAGNOSIS — R0600 Dyspnea, unspecified: Secondary | ICD-10-CM | POA: Diagnosis not present

## 2017-01-18 DIAGNOSIS — M1712 Unilateral primary osteoarthritis, left knee: Secondary | ICD-10-CM | POA: Diagnosis not present

## 2017-01-18 DIAGNOSIS — R942 Abnormal results of pulmonary function studies: Secondary | ICD-10-CM

## 2017-01-18 DIAGNOSIS — Z79899 Other long term (current) drug therapy: Secondary | ICD-10-CM

## 2017-01-22 NOTE — Progress Notes (Signed)
Chief Complaint  Patient presents with  . Annual Exam    HPI: [name] 71 y.o. comes in today for Preventive Medicare exam/ wellness visit has  [facility] Nahsher and  Lovena Le for af ablation ,  dypnea  ramaswamy   Early vx pre diabetes    meds no change   Mood    Staying the same    Health Maintenance  Topic Date Due  . Hepatitis C Screening  August 01, 1945  . URINE MICROALBUMIN  02/08/1956  . INFLUENZA VACCINE  01/31/2017  . HEMOGLOBIN A1C  05/16/2017  . FOOT EXAM  11/22/2017  . OPHTHALMOLOGY EXAM  01/08/2018  . MAMMOGRAM  11/30/2018  . TETANUS/TDAP  06/03/2019  . COLONOSCOPY  08/05/2022  . DEXA SCAN  Completed  . PNA vac Low Risk Adult  Completed   Health Maintenance Review LIFESTYLE:  Exercise:  [place]io says need to lose  Weight to do tkr.  Tobacco/ETS: no Alcohol:   Once a month  Sugar beverages: no Sleep: at least  8  Drug use: no HH:  2  Outside out deer dogs        Hearing: ok  Vision:  No limitations at present . Last eye check UTD  Safety:  Has smoke detector and wears seat belts.  No firearms. No excess sun exposure. Sees dentist regularly.  Falls: n.  Memory: Felt to be good  , no concern from her or her family.  Depression: No anhedonia unusual crying or depressive symptoms  Nutrition: Eats well balanced diet; adequate calcium and vitamin D. No swallowing chewing problems.  Injury: no major injuries in the last six months.  Other healthcare providers:  Reviewed today .  Social:  Lives with spouse married.    Preventive parameters: up-to-date  Reviewed   ADLS:   There are no problems or need for assistance  driving, feeding, obtaining food, dressing, toileting and bathing, managing money using phone. She is independent. But has  CHF and knee  Sx limitations    EXERCISE/ HABITS  Per week   No tobacco    etoh   ROS:  GEN/ HEENT: No fever, significant weight changes sweats headaches vision problems hearing changes, CV/ PULM; No chest pain  shortness of breath cough, syncope,edema  change in exercise tolerance. GI /GU: No adominal pain, vomiting, change in bowel habits. No blood in the stool. No significant GU symptoms. SKIN/HEME: ,no acute skin rashes suspicious lesions or bleeding. No lymphadenopathy, nodules, masses.  NEURO/ PSYCH:  No neurologic signs such as weakness numbness. No depression anxiety. IMM/ Allergy: No unusual infections.  Allergy .   REST of 12 system review negative except as per HPI   Past Medical History:  Diagnosis Date  . Anticoagulant long-term use    pradaxa  . Anxiety   . Arthritis    low back & both knees, Spondylolisthesis    . CAD (coronary artery disease) 1610,9604   post PTCA with bare-metal stenting to mid RCA in December 2004     . CHF (congestive heart failure) ([place])   . Chronic atrial fibrillation ([place]) 06/2007   Tachybradycardia pacemaker  . Chronic kidney disease    10% function - ?R, other kidney is compensating    . Depression   . Diplopia 06/19/2008   Qualifier: Diagnosis of  By: [name] MD, [name]   . Dysrhythmia    ATRIAL FIBRILATION  . Edema of lower extremity   . History of acute inferior wall MI   .  History of CVA (cerebrovascular accident)   . Hyperlipidemia   . Hypertension   . Myocardial infarction ([place]) [id number]  . OSA on CPAP    last test- 2010  . Pacemaker   . Pneumonia 2014   tx. ----  [facility]  . Shortness of breath   . Stroke ([place]) [id number]  . TIA (transient ischemic attack)   . Transfusion history   . Unspecified hemorrhoids without mention of complication 6/31/4970   Colonoscopy--Dr. Carlean Purl     Family History  Problem Relation Age of Onset  . Suicidality Father        suicide death pt was 3 yrs  . Arrhythmia Mother   . Hypertension Mother   . Diabetes Mother   . Heart attack Brother 43  . Heart disease Paternal Aunt   . Prostate cancer Maternal Grandfather   . Diabetes Paternal Grandfather        fathers side of the family     Social History   Social History  . Marital status: Married    Spouse name: N/A  . Number of children: 0  . Years of education: HS   Occupational History  . retired     previously worked [employer]  . Smoking status: Former Smoker    Packs/day: 1.00    Years: 5.00    Types: Cigarettes    Start date: 05/11/1978    Quit date: 07/03/1982  . Smokeless tobacco: Never Used  . Alcohol use Yes     Comment: once q 6 months-socailly-wine  . Drug use: No  . Sexual activity: Not Currently   Other Topics Concern  . 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   Married    Originally from [place] of two, high school education   Former smoker [phone] 1ppd   Hunting dogs 7    Retired from [employer] 2     Outpatient Encounter Prescriptions as of 01/23/2017  Medication Sig  . acetaminophen (TYLENOL) 325 MG tablet Take 2 tablets (650 mg total) by mouth every 4 (four) hours as needed for headache or mild pain. (Patient taking differently: Take 650 mg by mouth every morning. )  . amiodarone (PACERONE) 200 MG tablet Take 1 tablet (200 mg total) by mouth daily.  [name] apixaban (ELIQUIS) 5 MG TABS tablet Take 1 tablet (5 mg total) by mouth 2 (two) times daily.  . Ascorbic Acid (VITAMIN C) 1000 MG tablet Take 1,000 mg by mouth daily.  [name] atorvastatin (LIPITOR) 40 MG tablet TAKE 1 TABLET BY MOUTH EVERY DAY  . Calcium Carbonate-Vitamin D (CALCIUM 600+D PO) Take 1 tablet by mouth daily.  . Cyanocobalamin (B-12) 5000 MCG CAPS Take 5,000 mcg by mouth at bedtime.   [name] diltiazem (CARDIZEM CD) 120 MG 24 hr capsule Take 1 capsule (120 mg total) by mouth daily.  . diphenhydramine-acetaminophen (TYLENOL PM) 25-500 MG TABS tablet Take 2 tablets by mouth at bedtime as needed (pain/sleep).  [name] FLUoxetine (PROZAC) 20 MG capsule TAKE ONE CAPSULE BY MOUTH EVERY MORNING  . fluticasone (FLONASE)  50 MCG/ACT nasal spray Place 1 spray into both nostrils daily as needed for allergies or rhinitis.  . furosemide (LASIX) 40 MG tablet TAKE 2 TABLETS (80MG) BY MOUTH IN THE AM AND 1 TABLET (40MG) BY MOUTH IN THE PM  . loratadine (CLARITIN) 10 MG tablet Take 1  tablet (10 mg total) by mouth daily.  [name] Lysine HCl 1000 MG TABS Take 1,000 mg by mouth daily.   . metoprolol succinate (TOPROL-XL) 50 MG 24 hr tablet Take 1 tablet (50 mg total) by mouth 2 (two) times daily. Take with or immediately following a meal.  . Multiple Vitamin (MULTIVITAMIN WITH MINERALS) TABS tablet Take 1 tablet by mouth daily. Centrum  . nitroGLYCERIN (NITROSTAT) 0.4 MG SL tablet Place 1 tablet (0.4 mg total) under the tongue every 5 (five) minutes x 3 doses as needed for chest pain.  . pantoprazole (PROTONIX) 40 MG tablet Take 1 tablet (40 mg total) by mouth daily.  Vladimir Faster Glycol-Propyl Glycol (SYSTANE OP) Place 1 drop into both eyes daily as needed (dry eyes).  [name] PRESCRIPTION MEDICATION Inhale into the lungs at bedtime. CPAP  . vitamin E 1000 UNIT capsule Take 1,000 Units by mouth daily.  . potassium chloride SA (KLOR-CON M20) 20 MEQ tablet Take 1 tablet (20 mEq total) by mouth daily. (Patient taking differently: Take 20 mEq by mouth 2 (two) times daily. )   No facility-administered encounter medications on file as of 01/23/2017.     EXAM:  BP 110/80 (BP Location: Right Arm, Patient Position: Sitting, Cuff Size: Large)   Pulse 76   Temp 98.6 F (37 C) (Oral)   Ht '5\' 6"'  (1.676 m)   Wt 256 lb 6.4 oz (116.3 kg)   BMI 41.38 kg/m   Body mass index is 41.38 kg/m.  Physical Exam: Vital signs reviewed MEQ:ASTM is a well-developed well-nourished alert cooperative   who appears stated age in no acute distress.   Slow gait   No clubbing speech nl  HEENT: normocephalic atraumatic , Eyes: PERRL EOM's full, conjunctiva clear, Nares: paten,t no deformity discharge or tenderness., Ears: no deformity EAC's clear TMs with normal  landmarks. Mouth: clear OP, no lesions, edema.  Moist mucous membranes. Dentition in adequate repair. NECK: supple without masses, thyromegaly or bruits. CHEST/PULM:  Clear to auscultation and percussion breath sounds equal no wheeze , rales or rhonchi. No chest wall deformities or tenderness. Breast: normal by inspection . No dimpling, discharge, masses, tenderness or discharge . CV:, S1 S2 no gallops, murmurs, rubs. Peripheral pulses are presentwithout delay. ABDOMEN: Bowel sounds normal nontender  No guard or rebound, no hepato splenomegal no CVA tenderness.   Extremtities:  No clubbing cyanosis slight 1chornic edema, no acute joint swelling or redness no focal atrophy NEURO:  Oriented x3, cranial nerves 3-12 appear to be intact, no obvious focal weakness,gait within normal limits no abnormal reflexes or asymmetrical obvious  SKIN: No acute rashes normal turgor, color, no bruising or petechiae. PSYCH: Oriented, good eye contact, no obvious depression anxiety, cognition and judgment appear normal. LN: no cervical axillary inguinal adenopathy No noted deficits in memory, attention, and speech.   Lab Results  Component Value Date   WBC 11.6 (H) 11/12/2016   HGB 12.2 11/12/2016   HCT 38.4 11/12/2016   PLT 262 11/12/2016   GLUCOSE 133 (H) 11/15/2016   CHOL 116 10/16/2016   TRIG 139 10/16/2016   HDL 42 10/16/2016   LDLCALC 46 10/16/2016   ALT 27 01/23/2017   AST 19 01/23/2017   NA 138 11/15/2016   K 5.1 11/15/2016   CL 99 (L) 11/15/2016   CREATININE 1.12 (H) 11/15/2016   BUN 18 11/15/2016   CO2 28 11/15/2016   TSH 4.390 10/16/2016   INR 1.1 08/02/2016   HGBA1C 6.3 (H) 11/13/2016   MICROALBUR <0.7  01/23/2017   Wt Readings from Last 3 Encounters:  01/23/17 256 lb 6.4 oz (116.3 kg)  12/12/16 255 lb 6.4 oz (115.8 kg)  12/05/16 259 lb 3.2 oz (117.6 kg)    ASSESSMENT AND PLAN:  Discussed the following assessment and plan:  Visit for preventive health examination  Medication  management  Chronic diastolic heart failure- grade 2 diastolic dysfunction by echo 10/03/13 - Plan: Hepatic function panel  Essential hypertension  Fasting hyperglycemia  Elevated alkaline phosphatase measurement - Plan: Hepatic function panel, VITAMIN D 25 Hydroxy (Vit-D Deficiency, Fractures)  Vitamin D deficiency - Plan: VITAMIN D 25 Hydroxy (Vit-D Deficiency, Fractures)  Need for hepatitis C screening test - Plan: Hepatitis C Antibody  Hx of hyperglycemia - Plan: Urine Microalbumin w/creat. ratio  Severe obesity (BMI >= 40) (HCC)c Disc  Strategies for healthy affordable weight loss  Patient Care Team: [name], [name], MD as PCP - General Evans Lance, MD (Cardiology) Nahser, Wonda Cheng, MD (Cardiology) Gaynelle Arabian, MD as Consulting Physician (Orthopedic Surgery) Brand Males, MD as Consulting Physician (Pulmonary Disease)  Patient Instructions  Will notify you  of labs when available.   Continue    Dietary changes   Avoid eating at night . consdier weight wwatchers and tracking   Larges meal should be earlyier in day . Subs yogurt mayonaise etc  One peic of bread instead of 2 for sandwich etc.  Checking liver panel and  Hep c   Too early for blood sugar  Check into shingles vaccine. Shingrix  Can receive either in thepharmacy depending on insurance rules.   Preventive Care 99 Years and Older, Female Preventive care refers to lifestyle choices and visits with your health care provider that can promote health and wellness. What does preventive care include?  A yearly physical exam. This is also called an annual well check.  Dental exams once or twice a year.  Routine eye exams. Ask your health care provider how often you should have your eyes checked.  Personal lifestyle choices, including: ? Daily care of your teeth and gums. ? Regular physical activity. ? Eating a healthy diet. ? Avoiding tobacco and drug use. ? Limiting alcohol use. ? Practicing safe  sex. ? Taking low-dose aspirin every day. ? Taking vitamin and mineral supplements as recommended by your health care provider. What happens during an annual well check? The services and screenings done by your health care provider during your annual well check will depend on your age, overall health, lifestyle risk factors, and family history of disease. Counseling Your health care provider may ask you questions about your:  Alcohol use.  Tobacco use.  Drug use.  Emotional well-being.  Home and relationship well-being.  Sexual activity.  Eating habits.  History of falls.  Memory and ability to understand (cognition).  Work and work Statistician.  Reproductive health.  Screening You may have the following tests or measurements:  Height, weight, and BMI.  Blood pressure.  Lipid and cholesterol levels. These may be checked every 5 years, or more frequently if you are over 60 years old.  Skin check.  Lung cancer screening. You may have this screening every year starting at age 53 if you have a 30-pack-year history of smoking and currently smoke or have quit within the past 15 years.  Fecal occult blood test (FOBT) of the stool. You may have this test every year starting at age 94.  Flexible sigmoidoscopy or colonoscopy. You may have a sigmoidoscopy every 5 years or  a colonoscopy every 10 years starting at age 50.  Hepatitis C blood test.  Hepatitis B blood test.  Sexually transmitted disease (STD) testing.  Diabetes screening. This is done by checking your blood sugar (glucose) after you have not eaten for a while (fasting). You may have this done every 1-3 years.  Bone density scan. This is done to screen for osteoporosis. You may have this done starting at age 57.  Mammogram. This may be done every 1-2 years. Talk to your health care provider about how often you should have regular mammograms.  Talk with your health care provider about your test results,  treatment options, and if necessary, the need for more tests. Vaccines Your health care provider may recommend certain vaccines, such as:  Influenza vaccine. This is recommended every year.  Tetanus, diphtheria, and acellular pertussis (Tdap, Td) vaccine. You may need a Td booster every 10 years.  Varicella vaccine. You may need this if you have not been vaccinated.  Zoster vaccine. You may need this after age 84.  Measles, mumps, and rubella (MMR) vaccine. You may need at least one dose of MMR if you were born in 1957 or later. You may also need a second dose.  Pneumococcal 13-valent conjugate (PCV13) vaccine. One dose is recommended after age 48.  Pneumococcal polysaccharide (PPSV23) vaccine. One dose is recommended after age 5.  Meningococcal vaccine. You may need this if you have certain conditions.  Hepatitis A vaccine. You may need this if you have certain conditions or if you travel or work in places where you may be exposed to hepatitis A.  Hepatitis B vaccine. You may need this if you have certain conditions or if you travel or work in places where you may be exposed to hepatitis B.  Haemophilus influenzae type b (Hib) vaccine. You may need this if you have certain conditions.  Talk to your health care provider about which screenings and vaccines you need and how often you need them. This information is not intended to replace advice given to you by your health care provider. Make sure you discuss any questions you have with your health care provider. Document Released: 07/16/2015 Document Revised: 03/08/2016 Document Reviewed: 04/20/2015 Elsevier Interactive Patient Education  2017 [place] K. [name] M.D.

## 2017-01-23 ENCOUNTER — Telehealth: Payer: Self-pay | Admitting: Internal Medicine

## 2017-01-23 ENCOUNTER — Encounter: Payer: Self-pay | Admitting: Internal Medicine

## 2017-01-23 ENCOUNTER — Ambulatory Visit (INDEPENDENT_AMBULATORY_CARE_PROVIDER_SITE_OTHER): Payer: PPO | Admitting: Internal Medicine

## 2017-01-23 VITALS — BP 110/80 | HR 76 | Temp 98.6°F | Ht 66.0 in | Wt 256.4 lb

## 2017-01-23 DIAGNOSIS — Z79899 Other long term (current) drug therapy: Secondary | ICD-10-CM

## 2017-01-23 DIAGNOSIS — R748 Abnormal levels of other serum enzymes: Secondary | ICD-10-CM

## 2017-01-23 DIAGNOSIS — Z0000 Encounter for general adult medical examination without abnormal findings: Secondary | ICD-10-CM | POA: Diagnosis not present

## 2017-01-23 DIAGNOSIS — I10 Essential (primary) hypertension: Secondary | ICD-10-CM | POA: Diagnosis not present

## 2017-01-23 DIAGNOSIS — Z1159 Encounter for screening for other viral diseases: Secondary | ICD-10-CM | POA: Diagnosis not present

## 2017-01-23 DIAGNOSIS — I5032 Chronic diastolic (congestive) heart failure: Secondary | ICD-10-CM

## 2017-01-23 DIAGNOSIS — Z8639 Personal history of other endocrine, nutritional and metabolic disease: Secondary | ICD-10-CM

## 2017-01-23 DIAGNOSIS — R7301 Impaired fasting glucose: Secondary | ICD-10-CM | POA: Diagnosis not present

## 2017-01-23 DIAGNOSIS — E559 Vitamin D deficiency, unspecified: Secondary | ICD-10-CM

## 2017-01-23 LAB — HEPATIC FUNCTION PANEL
ALT: 27 U/L (ref 0–35)
AST: 19 U/L (ref 0–37)
Albumin: 3.8 g/dL (ref 3.5–5.2)
Alkaline Phosphatase: 120 U/L — ABNORMAL HIGH (ref 39–117)
Bilirubin, Direct: 0.2 mg/dL (ref 0.0–0.3)
Total Bilirubin: 0.7 mg/dL (ref 0.2–1.2)
Total Protein: 6.9 g/dL (ref 6.0–8.3)

## 2017-01-23 LAB — MICROALBUMIN / CREATININE URINE RATIO
Creatinine,U: 59.1 mg/dL
Microalb Creat Ratio: 1.2 mg/g (ref 0.0–30.0)
Microalb, Ur: <0.7 mg/dL (ref 0.0–1.9)

## 2017-01-23 LAB — VITAMIN D 25 HYDROXY (VIT D DEFICIENCY, FRACTURES): VITD: 32.83 ng/mL (ref 30.00–100.00)

## 2017-01-23 NOTE — Patient Instructions (Addendum)
Will notify you  of labs when available.   Continue    Dietary changes   Avoid eating at night . consdier weight wwatchers and tracking   Larges meal should be earlyier in day . Subs yogurt mayonaise etc  One peic of bread instead of 2 for sandwich etc.  Checking liver panel and  Hep c   Too early for blood sugar  Check into shingles vaccine. Shingrix  Can receive either in thepharmacy depending on insurance rules.   Preventive Care 65 Years and Older, Female Preventive care refers to lifestyle choices and visits with your health care provider that can promote health and wellness. What does preventive care include?  A yearly physical exam. This is also called an annual well check.  Dental exams once or twice a year.  Routine eye exams. Ask your health care provider how often you should have your eyes checked.  Personal lifestyle choices, including: ? Daily care of your teeth and gums. ? Regular physical activity. ? Eating a healthy diet. ? Avoiding tobacco and drug use. ? Limiting alcohol use. ? Practicing safe sex. ? Taking low-dose aspirin every day. ? Taking vitamin and mineral supplements as recommended by your health care provider. What happens during an annual well check? The services and screenings done by your health care provider during your annual well check will depend on your age, overall health, lifestyle risk factors, and family history of disease. Counseling Your health care provider may ask you questions about your:  Alcohol use.  Tobacco use.  Drug use.  Emotional well-being.  Home and relationship well-being.  Sexual activity.  Eating habits.  History of falls.  Memory and ability to understand (cognition).  Work and work environment.  Reproductive health.  Screening You may have the following tests or measurements:  Height, weight, and BMI.  Blood pressure.  Lipid and cholesterol levels. These may be checked every 5 years, or more  frequently if you are over 50 years old.  Skin check.  Lung cancer screening. You may have this screening every year starting at age 55 if you have a 30-pack-year history of smoking and currently smoke or have quit within the past 15 years.  Fecal occult blood test (FOBT) of the stool. You may have this test every year starting at age 50.  Flexible sigmoidoscopy or colonoscopy. You may have a sigmoidoscopy every 5 years or a colonoscopy every 10 years starting at age 50.  Hepatitis C blood test.  Hepatitis B blood test.  Sexually transmitted disease (STD) testing.  Diabetes screening. This is done by checking your blood sugar (glucose) after you have not eaten for a while (fasting). You may have this done every 1-3 years.  Bone density scan. This is done to screen for osteoporosis. You may have this done starting at age 65.  Mammogram. This may be done every 1-2 years. Talk to your health care provider about how often you should have regular mammograms.  Talk with your health care provider about your test results, treatment options, and if necessary, the need for more tests. Vaccines Your health care provider may recommend certain vaccines, such as:  Influenza vaccine. This is recommended every year.  Tetanus, diphtheria, and acellular pertussis (Tdap, Td) vaccine. You may need a Td booster every 10 years.  Varicella vaccine. You may need this if you have not been vaccinated.  Zoster vaccine. You may need this after age 60.  Measles, mumps, and rubella (MMR) vaccine. You may need   at least one dose of MMR if you were born in 1957 or later. You may also need a second dose.  Pneumococcal 13-valent conjugate (PCV13) vaccine. One dose is recommended after age 65.  Pneumococcal polysaccharide (PPSV23) vaccine. One dose is recommended after age 65.  Meningococcal vaccine. You may need this if you have certain conditions.  Hepatitis A vaccine. You may need this if you have certain  conditions or if you travel or work in places where you may be exposed to hepatitis A.  Hepatitis B vaccine. You may need this if you have certain conditions or if you travel or work in places where you may be exposed to hepatitis B.  Haemophilus influenzae type b (Hib) vaccine. You may need this if you have certain conditions.  Talk to your health care provider about which screenings and vaccines you need and how often you need them. This information is not intended to replace advice given to you by your health care provider. Make sure you discuss any questions you have with your health care provider. Document Released: 07/16/2015 Document Revised: 03/08/2016 Document Reviewed: 04/20/2015 Elsevier Interactive Patient Education  2017 Elsevier Inc[employer]  

## 2017-01-23 NOTE — Telephone Encounter (Signed)
Let [name]  Know that  HRCT shows findings c/w CHF (not ILD). She should  A) get back in touch with cards about this  B) make an appt to see me or an APP to discuss results; first avail is fine  C) I am going to hold off going through ILD workuop   Dr. Brand Males, M.D., [facility].C.P Pulmonary and Critical Care Medicine Staff Physician [place] [facility] Pager: [phone], If no answer or between  15:00h - 7:00h: call [phone]  01/23/2017 2:26 PM

## 2017-01-24 LAB — HEPATITIS C ANTIBODY: HCV Ab: NEGATIVE

## 2017-01-26 NOTE — Telephone Encounter (Signed)
lmtcb for pt.  

## 2017-01-29 NOTE — Telephone Encounter (Signed)
lmtcb for pt.  

## 2017-01-31 NOTE — Telephone Encounter (Signed)
Spoke with pt and notified of results per Dr. Chase Caller. Pt verbalized understanding and denied any questions. OV with MR scheduled for 02/22/17

## 2017-01-31 NOTE — Telephone Encounter (Signed)
Patient returned phone call, pt contact # [phone].Marland Kitchenert

## 2017-02-16 ENCOUNTER — Other Ambulatory Visit: Payer: Self-pay | Admitting: Emergency Medicine

## 2017-02-16 MED ORDER — PANTOPRAZOLE SODIUM 40 MG PO TBEC: 40.0000 mg | DELAYED_RELEASE_TABLET | Freq: Every day | ORAL | 2 refills | Status: DC

## 2017-02-20 ENCOUNTER — Other Ambulatory Visit: Payer: Self-pay | Admitting: Internal Medicine

## 2017-02-22 ENCOUNTER — Encounter: Payer: Self-pay | Admitting: Internal Medicine

## 2017-02-22 ENCOUNTER — Ambulatory Visit (INDEPENDENT_AMBULATORY_CARE_PROVIDER_SITE_OTHER): Payer: PPO | Admitting: Internal Medicine

## 2017-02-22 VITALS — BP 142/80 | HR 62 | Ht 66.0 in | Wt 252.0 lb

## 2017-02-22 DIAGNOSIS — R0989 Other specified symptoms and signs involving the circulatory and respiratory systems: Secondary | ICD-10-CM

## 2017-02-22 DIAGNOSIS — I5032 Chronic diastolic (congestive) heart failure: Secondary | ICD-10-CM | POA: Diagnosis not present

## 2017-02-22 DIAGNOSIS — Z79899 Other long term (current) drug therapy: Secondary | ICD-10-CM | POA: Diagnosis not present

## 2017-02-22 DIAGNOSIS — R0689 Other abnormalities of breathing: Secondary | ICD-10-CM | POA: Diagnosis not present

## 2017-02-22 DIAGNOSIS — R0600 Dyspnea, unspecified: Secondary | ICD-10-CM

## 2017-02-22 NOTE — Addendum Note (Signed)
Addendum  created 02/22/17 1155 by Roberts Gaudy, MD   Sign clinical note

## 2017-02-22 NOTE — Progress Notes (Signed)
Subjective:     Patient ID: [name], female   DOB: 08-06-1945, 71 y.o.   MRN: [id number]  HPI   PCP [name], [name], MD  HPI  IOV 12/05/2016  Chief Complaint  Patient presents with  . PULMONARY CONSULT    PULMONARY CONSULT FOR SOB referring doctor is Dr. [name]. DME is [facility] patient sleeps 8 hours a night with her CPAP machine     71 year old obese femal. She was admitted 11/12/2016 through 11/15/2016 for acute on chronic diastolic congestive heart failure. She is known to have sleep apnea essential hypertension coronary artery disease status post PTCA, paroxysmal A. fib, type 2 diabetes. She tells me that around 6 years ago she was diagnosed to have sleep apnea and started on CPAP therapy. Around this time she developed insidious onset of shortness of breath. Since then it has been progressively has been present on exertion relieved by rest. Class II-III activities bring it on. There is associated cough for the last 3 years. Both symptoms are progressive and is currently moderate in severity. Cough quality is dry. There is no associated chest pain or any radiation. There is no associated wheezing or orthopnea. Dyspnea can get worse during episodes of heart failure or atrial fibrillation. She's been on amiodarone therapy for the last 3 months after failing Tikosyn. She status post ablation. After the admission in May 2018 for heart failure that is concerned that she might have interstitial lung disease that she's been referred here. She feels currently well diuresed   Last chest x-ray 11/14/2016: Personally visualized and it looks like she has interstitial markings as opposed to heart failure   Echocardiogram April 2018: Pulmonary artery systolic pressure of 53 mmHg associated with grade 2 diastolic dysfunction  OV 10/03/4740  Chief Complaint  Patient presents with  . Follow-up    Pt here after HRCT. Pt [place] and states she wasnt SOB at all but  when she returned home she was more SOB. Pt denies cough and CP/tightness.     Follow-up chronic diastolic heart failure and amiodarone therapy with? Of interstitial lung disease   Last visit June 2018 there was question of interstitial lung disease therefore we did a high-resolution CT chest with thoracic radiology believes that the findings are consistent with pulmonary edema. Therefore I held off on interstitial lung disease workup. In the interim she has lost 7 pounds. She went to the mountains and was active and return on the last week and she says that her dyspnea is actually better. She continues to be on 80 mg Lasix in the morning with 40 mg Lasix at night on her stable dose for several months. Because of the weight loss is actually feeling better although after return to [place] on lower altitude she says some of the shortness of breath is better but she still better than before. Walking desaturation test 185 feet 3 laps on room air: She walk only one lap and stopped due to knee pain she is upcoming knee replacement. A pulse ox did go down to 93% but it was still adequate.    IMPRESSION: 1. Cardiomegaly. Diffuse interlobular septal thickening, parahilar ground-glass attenuation and upper lung predominant mild patchy consolidation. These findings are most compatible with pulmonary edema due to congestive heart failure. 2. No specific findings of interstitial lung disease. No hyperdense foci of consolidation in the lungs specific for amiodarone pulmonary toxicity. 3. Mild to moderate mediastinal and mild bilateral  hilar lymphadenopathy, stable since 09/25/2016, suggesting reactive/congestive adenopathy. 4. Three-vessel coronary atherosclerosis.  Aortic Atherosclerosis (ICD10-I70.0).   Electronically Signed   By: Ilona Sorrel M.D.   On: 01/18/2017 16:41    has a past medical history of Anticoagulant long-term use; Anxiety; Arthritis; CAD (coronary artery disease)  (1610,9604); CHF (congestive heart failure) ([place]); Chronic atrial fibrillation ([place]) (06/2007); Chronic kidney disease; Depression; Diplopia (06/19/2008); Dysrhythmia; Edema of lower extremity; History of acute inferior wall MI; History of CVA (cerebrovascular accident); Hyperlipidemia; Hypertension; Myocardial infarction [facility]) (5409,8119); OSA on CPAP; Pacemaker; Pneumonia (2014); Shortness of breath; Stroke [facility]) (1478,2956); TIA (transient ischemic attack); Transfusion history; and Unspecified hemorrhoids without mention of complication (08/16/863).   reports that she quit smoking about 34 years ago. Her smoking use included Cigarettes. She started smoking about 38 years ago. She has a 5.00 pack-year smoking history. She has never used smokeless tobacco.  Past Surgical History:  Procedure Laterality Date  . APPENDECTOMY    . ATRIAL FIBRILLATION ABLATION N/A 09/29/2016   Procedure: Atrial Fibrillation Ablation;  Surgeon: Will Meredith Leeds, MD;  Location: [place] [facility];  Service: Cardiovascular;  Laterality: N/A;  . back surgeries     x2-3 by Dr Trenton Gammon  . BACK SURGERY    . CATARACT EXTRACTION Left 01/15/2017  . CHOLECYSTECTOMY  1994  . CORONARY ANGIOPLASTY WITH STENT PLACEMENT  1998  . CORONARY STENT PLACEMENT     C stent  . DOPPLER ECHOCARDIOGRAPHY  2009  . ELECTROPHYSIOLOGIC STUDY N/A 03/31/2016   Procedure: Cardioversion;  Surgeon: Evans Lance, MD;  Location: [place] [facility];  Service: Cardiovascular;  Laterality: N/A;  . ELECTROPHYSIOLOGIC STUDY N/A 08/04/2016   Procedure: Cardioversion;  Surgeon: Evans Lance, MD;  Location: [place] [facility];  Service: Cardiovascular;  Laterality: N/A;  . LEFT AND RIGHT HEART CATHETERIZATION WITH CORONARY ANGIOGRAM N/A 10/06/2013   Procedure: LEFT AND RIGHT HEART CATHETERIZATION WITH CORONARY ANGIOGRAM;  Surgeon: Troy Sine, MD;  Location: [facility] C[facility] LAB;  Service: Cardiovascular;  Laterality: N/A;  . permanent pacemaker  06/2007  . TEE  WITHOUT CARDIOVERSION N/A 09/29/2016   Procedure: TRANSESOPHAGEAL ECHOCARDIOGRAM (TEE);  Surgeon: Jerline Pain, MD;  Location: [facility] ENDOSCOPY;  Service: Cardiovascular;  Laterality: N/A;  . TOTAL ABDOMINAL HYSTERECTOMY  1984   2 surgeries endometriosis bso    Allergies  Allergen Reactions  . Adhesive [Tape]     Allergic to EKG stickers and defibrillation pads.    Immunization History  Administered Date(s) Administered  . Influenza Split 04/18/2011, 05/06/2012  . Influenza Whole 07/04/2007, 06/02/2009, 03/01/2010  . Influenza, High Dose Seasonal PF 04/03/2014, 04/18/2016  . Influenza,inj,Quad PF,6+ Mos 03/07/2013, 04/06/2015  . Pneumococcal Conjugate-13 09/24/2013  . Pneumococcal Polysaccharide-23 07/03/2005, 07/04/2007, 04/18/2011  . Td 06/02/2009  . Zoster 07/03/2006    Family History  Problem Relation Age of Onset  . Suicidality Father        suicide death pt was 3 yrs  . Arrhythmia Mother   . Hypertension Mother   . Diabetes Mother   . Heart attack Brother 20  . Heart disease Paternal Aunt   . Prostate cancer Maternal Grandfather   . Diabetes Paternal Grandfather        fathers side of the family     Current Outpatient Prescriptions:  .  acetaminophen (TYLENOL) 325 MG tablet, Take 2 tablets (650 mg total) by mouth every 4 (four) hours as needed for headache or mild pain. (Patient taking differently: Take 650 mg by mouth every morning. ),  Disp: , Rfl:  .  amiodarone (PACERONE) 200 MG tablet, Take 1 tablet (200 mg total) by mouth daily., Disp: 30 tablet, Rfl: 11 .  Ascorbic Acid (VITAMIN C) 1000 MG tablet, Take 1,000 mg by mouth daily., Disp: , Rfl:  .  atorvastatin (LIPITOR) 40 MG tablet, TAKE 1 TABLET BY MOUTH EVERY DAY, Disp: 90 tablet, Rfl: 2 .  Calcium Carbonate-Vitamin D (CALCIUM 600+D PO), Take 1 tablet by mouth daily., Disp: , Rfl:  .  Cyanocobalamin (B-12) 5000 MCG CAPS, Take 5,000 mcg by mouth at bedtime. , Disp: , Rfl:  .  diltiazem (CARDIZEM CD) 120 MG 24 hr  capsule, Take 1 capsule (120 mg total) by mouth daily., Disp: 30 capsule, Rfl: 11 .  diphenhydramine-acetaminophen (TYLENOL PM) 25-500 MG TABS tablet, Take 2 tablets by mouth at bedtime as needed (pain/sleep)., Disp: , Rfl:  .  ELIQUIS 5 MG TABS tablet, TAKE 1 TABLET BY MOUTH TWICE A DAY, Disp: 60 tablet, Rfl: 11 .  FLUoxetine (PROZAC) 20 MG capsule, TAKE ONE CAPSULE BY MOUTH EVERY MORNING, Disp: 90 capsule, Rfl: 1 .  fluticasone (FLONASE) 50 MCG/ACT nasal spray, Place 1 spray into both nostrils daily as needed for allergies or rhinitis., Disp: , Rfl:  .  furosemide (LASIX) 40 MG tablet, TAKE 2 TABLETS (80MG ) BY MOUTH IN THE AM AND 1 TABLET (40MG ) BY MOUTH IN THE PM, Disp: 270 tablet, Rfl: 3 .  loratadine (CLARITIN) 10 MG tablet, Take 1 tablet (10 mg total) by mouth daily., Disp: 30 tablet, Rfl: 5 .  Lysine HCl 1000 MG TABS, Take 1,000 mg by mouth daily. , Disp: , Rfl:  .  metoprolol succinate (TOPROL-XL) 50 MG 24 hr tablet, Take 1 tablet (50 mg total) by mouth 2 (two) times daily. Take with or immediately following a meal., Disp: 60 tablet, Rfl: 11 .  Multiple Vitamin (MULTIVITAMIN WITH MINERALS) TABS tablet, Take 1 tablet by mouth daily. Centrum, Disp: , Rfl:  .  nitroGLYCERIN (NITROSTAT) 0.4 MG SL tablet, Place 1 tablet (0.4 mg total) under the tongue every 5 (five) minutes x 3 doses as needed for chest pain., Disp: 25 tablet, Rfl: 6 .  pantoprazole (PROTONIX) 40 MG tablet, Take 1 tablet (40 mg total) by mouth daily., Disp: 30 tablet, Rfl: 2 .  Polyethyl Glycol-Propyl Glycol (SYSTANE OP), Place 1 drop into both eyes daily as needed (dry eyes)., Disp: , Rfl:  .  PRESCRIPTION MEDICATION, Inhale into the lungs at bedtime. CPAP, Disp: , Rfl:  .  vitamin E 1000 UNIT capsule, Take 1,000 Units by mouth daily., Disp: , Rfl:  .  potassium chloride SA (KLOR-CON M20) 20 MEQ tablet, Take 1 tablet (20 mEq total) by mouth daily. (Patient taking differently: Take 20 mEq by mouth 2 (two) times daily. ), Disp: 180  tablet, Rfl: 3    Review of Systems     Objective:   Physical Exam Vitals:   02/22/17 1208  BP: (!) 142/80  Pulse: 62  SpO2: 94%  Weight: 252 lb (114.3 kg)  Height: 5\' 6"  (1.676 m)    Estimated body mass index is 40.67 kg/m as calculated from the following:   Height as of this encounter: 5\' 6"  (1.676 m).   Weight as of this encounter: 252 lb (114.3 kg).  Obese female not much edema. Pleasant. Normal heart sounds. Scattered crackles particularly in the upper lobes and some in the lower lobes    Assessment:       ICD-10-CM   1. Dyspnea and  respiratory abnormalities R06.00    R06.89   2. Bibasilar crackles R09.89   3. On amiodarone therapy Z79.899   4. Chronic diastolic heart failure- grade 2 diastolic dysfunction by echo 10/03/13 I50.32        Plan:      CT scan suggests that the primary problem appears to be of chronic diastolic heart failure The fact that high up on the altitude he felt better and also with passage of time after 7 pound weight loss you feeling better suggests again that it is not interstitial lung disease that is the primary problem However I cannot confidently rule out that you might not have interstitial lung disease    Plan - We'll hold of interstitial lung disease workup for now - Increase Lasix from 80 mg/40 mg to 80 mg twice daily - Follow-up with Dr. Cardiology  Next 2 weeks - return to see me for clinical visit in 6-8 weeks - Mid January 2018 with high resolution CT chest supine and prone;  six-month follow-up - At some point in time he might end up needing a right heart catheterization if we are unable to sort out if pulmonary infiltrates on her cardiac versus lung issues   Follow-up - return to see me in 6-8 weeks - Have high resolution CT scan of the chest mid January 2018 - But return anytime sooner if having problems      (> 50% of this 15 min visit spent in face to face counseling or/and coordination of care)  Dr. Brand Males, M.D., [facility].C.P Pulmonary and Critical Care Medicine   Staff Physician [place] [facility] Pager: [phone], If no answer or between  15:00h - 7:00h: call [phone]  02/22/2017 12:33 PM

## 2017-02-22 NOTE — Patient Instructions (Addendum)
ICD-10-CM   1. Dyspnea and respiratory abnormalities R06.00    R06.89   2. Bibasilar crackles R09.89   3. On amiodarone therapy Z79.899   4. Chronic diastolic heart failure- grade 2 diastolic dysfunction by echo 10/03/13 I50.32    CT scan suggests that the primary problem appears to be of chronic diastolic heart failure The fact that high up on the altitude he felt better and also with passage of time after 7 pound weight loss you feeling better suggests again that it is not interstitial lung disease that is the primary problem  Plan - We'll hold of interstitial lung disease workup for now - Increase Lasix from 80 mg/40 mg to 80 mg twice daily - Follow-up with Dr. Cardiology  Next 2 weeks - return to see me for clinical visit in 6-8 weeks - Mid January 2018 with high resolution CT chest supine and prone;  six-month follow-up - At some point in time he might end up needing a right heart catheterization if we are unable to sort out if pulmonary infiltrates on her cardiac versus lung issues   Follow-up - return to see me in 6-8 weeks - Have high resolution CT scan of the chest mid January 2018 - But return anytime sooner if having problems

## 2017-02-28 ENCOUNTER — Telehealth: Payer: Self-pay | Admitting: Internal Medicine

## 2017-02-28 DIAGNOSIS — G4733 Obstructive sleep apnea (adult) (pediatric): Secondary | ICD-10-CM

## 2017-02-28 NOTE — Telephone Encounter (Signed)
Pt requesting order to be sent to [facility] for replacement cpap supplies.  Pt was seeing [place] for sleep, saw CY over 1 year ago, has not seen him since.  MR ok to order cpap supplies?

## 2017-03-02 NOTE — Telephone Encounter (Signed)
MR please advise if you are ok to order cpap supplies for this pt. Pt has a pending appt with you on 10/4  Thanks

## 2017-03-06 NOTE — Telephone Encounter (Signed)
MR please advise thanks  

## 2017-03-08 DIAGNOSIS — M1712 Unilateral primary osteoarthritis, left knee: Secondary | ICD-10-CM | POA: Diagnosis not present

## 2017-03-13 DIAGNOSIS — H2511 Age-related nuclear cataract, right eye: Secondary | ICD-10-CM | POA: Diagnosis not present

## 2017-03-14 NOTE — Telephone Encounter (Signed)
Please ask Dr Annamaria Boots to order cPAP supplies. I do not do sleep medicine  ,Dr. Brand Males, M.D., [facility].C.P Pulmonary and Critical Care Medicine Staff Physician [place] [facility] Pager: [phone], If no answer or between  15:00h - 7:00h: call [phone]  03/14/2017 10:37 PM

## 2017-03-14 NOTE — Telephone Encounter (Signed)
MR please advise. Thanks! 

## 2017-03-15 NOTE — Telephone Encounter (Signed)
Please order- her DME- refill mask of choice, and CPAP supplies     Dx OSA

## 2017-03-15 NOTE — Telephone Encounter (Signed)
cpap supplies ordered.  Nothing further needed.

## 2017-03-15 NOTE — Telephone Encounter (Signed)
Dr. Annamaria Boots can we order CPAP supplies? Please advise.

## 2017-03-19 DIAGNOSIS — H2511 Age-related nuclear cataract, right eye: Secondary | ICD-10-CM | POA: Diagnosis not present

## 2017-03-19 DIAGNOSIS — H25811 Combined forms of age-related cataract, right eye: Secondary | ICD-10-CM | POA: Diagnosis not present

## 2017-03-20 NOTE — Progress Notes (Addendum)
Electrophysiology Office Note   Date:  03/21/2017   ID:  [name], DOB May 12, 1946, MRN [id number]  PCP:  [name], MD  Primary Electrophysiologist: Gaye Alken, MD    Chief Complaint  Patient presents with  . Pacemaker Check    PAF     History of Present Illness: [name] is a 71 y.o. female who presents today for electrophysiology evaluation.   She has a history of coronary artery disease status post stent to the RCA in 2004, CHF, atrial fibrillation with tachybradycardia syndrome status post pacemaker, CKD, hypertension, hyperlipidemia, inferior wall MI, sleep apnea on CPAP, stroke. She is maintaining sinus rhythm on CPAP and dofetilide until she got an infection and when in atrial fibrillation. Had ablation on 09/29/16.  Today, denies symptoms of palpitations, chest pain, shortness of breath, orthopnea, PND, lower extremity edema, claudication, dizziness, presyncope, syncope, bleeding, or neurologic sequela. The patient is tolerating medications without difficulties. She has not had any further episodes of palpitations. She recently had her cataracts fixed. She is planning to have knee surgery in October. She is not had palpitations, shortness of breath, or anything that feels like her atrial fibrillation.    Past Medical History:  Diagnosis Date  . Anticoagulant long-term use    pradaxa  . Anxiety   . Arthritis    low back & both knees, Spondylolisthesis    . CAD (coronary artery disease) 6237,6283   post PTCA with bare-metal stenting to mid RCA in December 2004     . CHF (congestive heart failure) ([place])   . Chronic atrial fibrillation ([place]) 06/2007   Tachybradycardia pacemaker  . Chronic kidney disease    10% function - ?R, other kidney is compensating    . Depression   . Diplopia 06/19/2008   Qualifier: Diagnosis of  By: [name] MD, [name]   . Dysrhythmia    ATRIAL FIBRILATION  . Edema of lower extremity   . History of acute inferior wall  MI   . History of CVA (cerebrovascular accident)   . Hyperlipidemia   . Hypertension   . Myocardial infarction ([place]) [id number]  . OSA on CPAP    last test- 2010  . Pacemaker   . Pneumonia 2014   tx. ----  [facility]  . Shortness of breath   . Stroke ([place]) [id number]  . TIA (transient ischemic attack)   . Transfusion history   . Unspecified hemorrhoids without mention of complication 1/51/7616   Colonoscopy--Dr. Carlean Purl    Past Surgical History:  Procedure Laterality Date  . APPENDECTOMY    . ATRIAL FIBRILLATION ABLATION N/A 09/29/2016   Procedure: Atrial Fibrillation Ablation;  Surgeon: Ellieana Dolecki Meredith Leeds, MD;  Location: [facility];  Service: Cardiovascular;  Laterality: N/A;  . back surgeries     x2-3 by Dr Trenton Gammon  . BACK SURGERY    . CATARACT EXTRACTION Left 01/15/2017  . CHOLECYSTECTOMY  1994  . CORONARY ANGIOPLASTY WITH STENT PLACEMENT  1998  . CORONARY STENT PLACEMENT     C stent  . DOPPLER ECHOCARDIOGRAPHY  2009  . ELECTROPHYSIOLOGIC STUDY N/A 03/31/2016   Procedure: Cardioversion;  Surgeon: Evans Lance, MD;  Location: [facility];  Service: Cardiovascular;  Laterality: N/A;  . ELECTROPHYSIOLOGIC STUDY N/A 08/04/2016   Procedure: Cardioversion;  Surgeon: Evans Lance, MD;  Location: [facility];  Service: Cardiovascular;  Laterality: N/A;  . LEFT AND RIGHT HEART CATHETERIZATION WITH CORONARY ANGIOGRAM N/A 10/06/2013  Procedure: LEFT AND RIGHT HEART CATHETERIZATION WITH CORONARY ANGIOGRAM;  Surgeon: Troy Sine, MD;  Location: [facility] CATH LAB;  Service: Cardiovascular;  Laterality: N/A;  . permanent pacemaker  06/2007  . TEE WITHOUT CARDIOVERSION N/A 09/29/2016   Procedure: TRANSESOPHAGEAL ECHOCARDIOGRAM (TEE);  Surgeon: Jerline Pain, MD;  Location: [facility] ENDOSCOPY;  Service: Cardiovascular;  Laterality: N/A;  . TOTAL ABDOMINAL HYSTERECTOMY  1984   2 surgeries endometriosis bso     Current Outpatient Prescriptions  Medication Sig Dispense  Refill  . acetaminophen (TYLENOL) 325 MG tablet Take 2 tablets (650 mg total) by mouth every 4 (four) hours as needed for headache or mild pain. (Patient taking differently: Take 650 mg by mouth every morning. )    . amiodarone (PACERONE) 200 MG tablet Take 1 tablet (200 mg total) by mouth daily. 30 tablet 11  . Ascorbic Acid (VITAMIN C) 1000 MG tablet Take 1,000 mg by mouth daily.    [name] atorvastatin (LIPITOR) 40 MG tablet TAKE 1 TABLET BY MOUTH EVERY DAY 90 tablet 2  . Calcium Carbonate-Vitamin D (CALCIUM 600+D PO) Take 1 tablet by mouth daily.    . Cyanocobalamin (B-12) 5000 MCG CAPS Take 5,000 mcg by mouth at bedtime.     [name] diltiazem (CARDIZEM CD) 120 MG 24 hr capsule Take 1 capsule (120 mg total) by mouth daily. 30 capsule 11  . diphenhydramine-acetaminophen (TYLENOL PM) 25-500 MG TABS tablet Take 2 tablets by mouth at bedtime as needed (pain/sleep).    [name] ELIQUIS 5 MG TABS tablet TAKE 1 TABLET BY MOUTH TWICE A DAY 60 tablet 11  . FLUoxetine (PROZAC) 20 MG capsule TAKE ONE CAPSULE BY MOUTH EVERY MORNING 90 capsule 1  . fluticasone (FLONASE) 50 MCG/ACT nasal spray Place 1 spray into both nostrils daily as needed for allergies or rhinitis.    . furosemide (LASIX) 80 MG tablet Take 80 mg by mouth 2 (two) times daily.    [name] loratadine (CLARITIN) 10 MG tablet Take 1 tablet (10 mg total) by mouth daily. 30 tablet 5  . Lysine HCl 1000 MG TABS Take 1,000 mg by mouth daily.     . metoprolol succinate (TOPROL-XL) 50 MG 24 hr tablet Take 1 tablet (50 mg total) by mouth 2 (two) times daily. Take with or immediately following a meal. 60 tablet 11  . Multiple Vitamin (MULTIVITAMIN WITH MINERALS) TABS tablet Take 1 tablet by mouth daily. Centrum    . nitroGLYCERIN (NITROSTAT) 0.4 MG SL tablet Place 1 tablet (0.4 mg total) under the tongue every 5 (five) minutes x 3 doses as needed for chest pain. 25 tablet 6  . pantoprazole (PROTONIX) 40 MG tablet Take 1 tablet (40 mg total) by mouth daily. 30 tablet 2  .  Polyethyl Glycol-Propyl Glycol (SYSTANE OP) Place 1 drop into both eyes daily as needed (dry eyes).    [name] PRESCRIPTION MEDICATION Inhale into the lungs at bedtime. CPAP    . vitamin E 1000 UNIT capsule Take 1,000 Units by mouth daily.    . potassium chloride SA (KLOR-CON M20) 20 MEQ tablet Take 1 tablet (20 mEq total) by mouth daily. (Patient taking differently: Take 20 mEq by mouth 2 (two) times daily. ) 180 tablet 3   No current facility-administered medications for this visit.     Allergies:   Adhesive [tape]   Social History:  The patient  reports that she quit smoking about 34 years ago. Her smoking use included Cigarettes. She started smoking about 38 years ago. She has  a 5.00 pack-year smoking history. She has never used smokeless tobacco. She reports that she drinks alcohol. She reports that she does not use drugs.   Family History:  The patient's family history includes Arrhythmia in her mother; Diabetes in her mother and paternal grandfather; Heart attack (age of onset: 37) in her brother; Heart disease in her paternal aunt; Hypertension in her mother; Prostate cancer in her maternal grandfather; Suicidality in her father.    ROS:  Please see the history of present illness.   Otherwise, review of systems is positive for none.   All other systems are reviewed and negative.   PHYSICAL EXAM: VS:  BP 122/80   Pulse 62   Ht 5\' 6"  (1.676 m)   Wt 246 lb (111.6 kg)   SpO2 92%   BMI 39.71 kg/m  , BMI Body mass index is 39.71 kg/m. GEN: Well nourished, well developed, in no acute distress  HEENT: normal  Neck: no JVD, carotid bruits, or masses Cardiac: RRR; no murmurs, rubs, or gallops,no edema  Respiratory:  clear to auscultation bilaterally, normal work of breathing GI: soft, nontender, nondistended, + BS MS: no deformity or atrophy  Skin: warm and dry Neuro:  Strength and sensation are intact Psych: euthymic mood, full affect  EKG:  EKG is ordered today. Personal review of the  ekg ordered shows sinus rhythm, first-degree AV block, rate 62  Personal review of the device interrogation today. Results in [place]: 10/16/2016: TSH 4.390 11/12/2016: B Natriuretic Peptide 139.0; Hemoglobin 12.2; Platelets 262 11/13/2016: Magnesium 2.1 11/15/2016: BUN 18; Creatinine, Ser 1.12; Potassium 5.1; Sodium 138 01/23/2017: ALT 27    Lipid Panel     Component Value Date/Time   CHOL 116 10/16/2016 0850   TRIG 139 10/16/2016 0850   HDL 42 10/16/2016 0850   CHOLHDL 2.8 10/16/2016 0850   CHOLHDL 4.5 03/31/2016 0248   VLDL 29 03/31/2016 0248   LDLCALC 46 10/16/2016 0850     Wt Readings from Last 3 Encounters:  03/21/17 246 lb (111.6 kg)  02/22/17 252 lb (114.3 kg)  01/23/17 256 lb 6.4 oz (116.3 kg)      Other studies Reviewed: Additional studies/ records that were reviewed today include: TTE 03/28/16  Review of the above records today demonstrates:  - Left ventricle: The cavity size was normal. Wall thickness was   increased in a pattern of mild LVH. Systolic function was normal.   The estimated ejection fraction was in the range of 55% to 60%.   Indeterminant diastolic function (atrial fibrillation). Wall   motion was normal; there were no regional wall motion   abnormalities. - Aortic valve: There was no stenosis. - Mitral valve: Mildly calcified annulus. There was no significant   regurgitation. - Left atrium: The atrium was mildly dilated. - Right ventricle: The cavity size was normal. Pacer wire or   catheter noted in right ventricle. Systolic function was normal. - Tricuspid valve: Peak RV-RA gradient (S): 22 mm Hg. - Pulmonary arteries: PA peak pressure: 30 mm Hg (S). - Systemic veins: IVC measured 2.3 cm with normal respirophasic   variation, suggesting RA pressure 8 mmHg.   ASSESSMENT AND PLAN:  1.  Paroxysmal atrial fibrillation: On Eliquis, status post AF ablation 09/29/16. Remains in sinus rhythm. She feels well today without complaint. No  changes at this time.  This patients CHA2DS2-VASc Score and unadjusted Ischemic Stroke Rate (% per year) is equal to 3.2 % stroke rate/year from a score of 3  Above score calculated as 1 point each if present [CHF, HTN, DM, Vascular=MI/PAD/Aortic Plaque, Age if 65-74, or Female] Above score calculated as 2 points each if present [Age > 75, or Stroke/TIA/TE]  2. Hypertension: Well controlled currently. No changes at this time.  3. Obesity: Weight loss encouraged  4. Chronic diastolic heart failure: No symptoms of heart failure or volume overload  5. Obstructive sleep apnea: Encouraged CPAP compliance   Current medicines are reviewed at length with the patient today.   The patient does not have concerns regarding her medicines.  The following changes were made today:  None   Labs/ tests ordered today include: CBC, BMP Orders Placed This Encounter  Procedures  . CUP PACEART [place]  . EKG 12-Lead     Disposition:   FU with Havilah Topor 6 months  Signed, Clyde Zarrella Meredith Leeds, MD  03/21/2017 10:25 AM     [facility] [address] [place] [address] [phone] (office) [phone] (fax)

## 2017-03-21 ENCOUNTER — Encounter: Payer: Self-pay | Admitting: Cardiology

## 2017-03-21 ENCOUNTER — Ambulatory Visit (INDEPENDENT_AMBULATORY_CARE_PROVIDER_SITE_OTHER): Payer: PPO | Admitting: Cardiology

## 2017-03-21 VITALS — BP 122/80 | HR 62 | Ht 66.0 in | Wt 246.0 lb

## 2017-03-21 DIAGNOSIS — G4733 Obstructive sleep apnea (adult) (pediatric): Secondary | ICD-10-CM

## 2017-03-21 DIAGNOSIS — I480 Paroxysmal atrial fibrillation: Secondary | ICD-10-CM

## 2017-03-21 DIAGNOSIS — I495 Sick sinus syndrome: Secondary | ICD-10-CM

## 2017-03-21 LAB — CUP PACEART INCLINIC DEVICE CHECK
Battery Impedance: 1300 Ohm
Battery Voltage: 2.79 V
Brady Statistic RA Percent Paced: 1 % — CL
Brady Statistic RV Percent Paced: 1 % — CL
Date Time Interrogation Session: 20180919101923
Implantable Lead Implant Date: 20081211
Implantable Lead Implant Date: 20081211
Implantable Lead Location: 753859
Implantable Lead Location: 753860
Implantable Pulse Generator Implant Date: 20081211
Lead Channel Impedance Value: 395 Ohm
Lead Channel Impedance Value: 447 Ohm
Lead Channel Pacing Threshold Amplitude: 0.75 V
Lead Channel Pacing Threshold Amplitude: 1.25 V
Lead Channel Pacing Threshold Pulse Width: 0.4 ms
Lead Channel Pacing Threshold Pulse Width: 1 ms
Lead Channel Setting Pacing Amplitude: 2 V
Lead Channel Setting Pacing Amplitude: 2.5 V
Lead Channel Setting Pacing Pulse Width: 1 ms
Lead Channel Setting Sensing Sensitivity: 2 mV
Pulse Gen Model: 5826
Pulse Gen Serial Number: 1999089

## 2017-03-21 NOTE — Patient Instructions (Signed)
Medication Instructions:  Your physician recommends that you continue on your current medications as directed. Please refer to the Current Medication list given to you today.  If you need a refill on your cardiac medications before your next appointment, please call your pharmacy.   Labwork: None ordered  Testing/Procedures: None ordered  Follow-Up: Your physician wants you to follow-up in: 6 months with Dr. Camnitz.  You will receive a reminder letter in the mail two months in advance. If you don't receive a letter, please call our office to schedule the follow-up appointment.  Thank you for choosing [facility]!!   Suhayla Chisom, RN [phone]         

## 2017-03-22 DIAGNOSIS — G4733 Obstructive sleep apnea (adult) (pediatric): Secondary | ICD-10-CM | POA: Diagnosis not present

## 2017-03-23 ENCOUNTER — Encounter: Payer: Self-pay | Admitting: Internal Medicine

## 2017-03-26 ENCOUNTER — Other Ambulatory Visit: Payer: Self-pay | Admitting: *Deleted

## 2017-03-26 NOTE — Telephone Encounter (Signed)
Patient called and stated that she was told at her last visit that an rx for furosemide 80 mg bid would be sent in. She stated that her pulmonologist reduced her to this dose and Dr Curt Bears told her that he also approved of that dose. Okay to refill? Please advise. Thanks, MI

## 2017-03-26 NOTE — Telephone Encounter (Signed)
Ok to fill Lasix 80 mg BID

## 2017-03-27 MED ORDER — FUROSEMIDE 80 MG PO TABS: 80.0000 mg | ORAL_TABLET | Freq: Two times a day (BID) | ORAL | 11 refills | Status: DC

## 2017-04-05 ENCOUNTER — Ambulatory Visit (INDEPENDENT_AMBULATORY_CARE_PROVIDER_SITE_OTHER): Payer: PPO | Admitting: Internal Medicine

## 2017-04-05 ENCOUNTER — Encounter: Payer: Self-pay | Admitting: Internal Medicine

## 2017-04-05 VITALS — BP 130/76 | HR 60 | Ht 66.5 in | Wt 245.6 lb

## 2017-04-05 DIAGNOSIS — R0600 Dyspnea, unspecified: Secondary | ICD-10-CM

## 2017-04-05 DIAGNOSIS — Z79899 Other long term (current) drug therapy: Secondary | ICD-10-CM

## 2017-04-05 DIAGNOSIS — R0602 Shortness of breath: Secondary | ICD-10-CM | POA: Diagnosis not present

## 2017-04-05 DIAGNOSIS — R0989 Other specified symptoms and signs involving the circulatory and respiratory systems: Secondary | ICD-10-CM | POA: Diagnosis not present

## 2017-04-05 DIAGNOSIS — Z23 Encounter for immunization: Secondary | ICD-10-CM

## 2017-04-05 DIAGNOSIS — R942 Abnormal results of pulmonary function studies: Secondary | ICD-10-CM

## 2017-04-05 DIAGNOSIS — I5032 Chronic diastolic (congestive) heart failure: Secondary | ICD-10-CM

## 2017-04-05 DIAGNOSIS — R0689 Other abnormalities of breathing: Secondary | ICD-10-CM

## 2017-04-05 NOTE — Patient Instructions (Signed)
ICD-10-CM   1. Dyspnea and respiratory abnormalities R06.00    R06.89   2. Bibasilar crackles R09.89   3. On amiodarone therapy Z79.899   4. Chronic diastolic heart failure- grade 2 diastolic dysfunction by echo 10/03/13 I50.32   5. Abnormal PFTs (pulmonary function tests) R94.2     I still hear crackles even though you have lost weight and feel better with lasix and diet So, I am still concerned that you might have pulmonary fibrosis Because, you are having knee surgery 04/23/17 I want to get CT chest next few to several days  PLAN - wil not walk you due to knee issues - High Resolution CT chest without contrast on ILD protocol. DO both Supine and Prone Images. Only  Dr Lorin Picket or Dr. Vinnie Langton to read.  - high dose flu shot 04/05/2017 - Please talk to PCP [name], [name], MD -  and ensure you get  shingarix vaccine - decide on knee surgery pre-op clearance after ct chest but should be ok  Followup Mgiht call for blood work depending on CT results 3 months or sooner if needed

## 2017-04-05 NOTE — Progress Notes (Signed)
Subjective:     Patient ID: [name], female   DOB: 1946-02-22, 71 y.o.   MRN: [id number]  HPI PCP [name], [name], MD  HPI  IOV 12/05/2016  Chief Complaint  Patient presents with  . PULMONARY CONSULT    PULMONARY CONSULT FOR SOB referring doctor is Dr. [name]. DME is [facility] patient sleeps 8 hours a night with her CPAP machine     71 year old obese femal. She was admitted 11/12/2016 through 11/15/2016 for acute on chronic diastolic congestive heart failure. She is known to have sleep apnea essential hypertension coronary artery disease status post PTCA, paroxysmal A. fib, type 2 diabetes. She tells me that around 6 years ago she was diagnosed to have sleep apnea and started on CPAP therapy. Around this time she developed insidious onset of shortness of breath. Since then it has been progressively has been present on exertion relieved by rest. Class II-III activities bring it on. There is associated cough for the last 3 years. Both symptoms are progressive and is currently moderate in severity. Cough quality is dry. There is no associated chest pain or any radiation. There is no associated wheezing or orthopnea. Dyspnea can get worse during episodes of heart failure or atrial fibrillation. She's been on amiodarone therapy for the last 3 months after failing Tikosyn. She status post ablation. After the admission in May 2018 for heart failure that is concerned that she might have interstitial lung disease that she's been referred here. She feels currently well diuresed   Last chest x-ray 11/14/2016: Personally visualized and it looks like she has interstitial markings as opposed to heart failure   Echocardiogram April 2018: Pulmonary artery systolic pressure of 53 mmHg associated with grade 2 diastolic dysfunction  OV 8/33/8250  Chief Complaint  Patient presents with  . Follow-up    Pt here after HRCT. Pt states [place] the mountains [place] and states she wasnt SOB at all but when  she returned home she was more SOB. Pt denies cough and CP/tightness.     Follow-up chronic diastolic heart failure and amiodarone therapy with? Of interstitial lung disease   Last visit June 2018 there was question of interstitial lung disease therefore we did a high-resolution CT chest with thoracic radiology believes that the findings are consistent with pulmonary edema. Therefore I held off on interstitial lung disease workup. In the interim she has lost 7 pounds. She went to the mountains and was active and return on the last week and she says that her dyspnea is actually better. She continues to be on 80 mg Lasix in the morning with 40 mg Lasix at night on her stable dose for several months. Because of the weight loss is actually feeling better although after return to [place] on lower altitude she says some of the shortness of breath is better but she still better than before. Walking desaturation test 185 feet 3 laps on room air: She walk only one lap and stopped due to knee pain she is upcoming knee replacement. A pulse ox did go down to 93% but it was still adequate.    IMPRESSION: 1. Cardiomegaly. Diffuse interlobular septal thickening, parahilar ground-glass attenuation and upper lung predominant mild patchy consolidation. These findings are most compatible with pulmonary edema due to congestive heart failure. 2. No specific findings of interstitial lung disease. No hyperdense foci of consolidation in the lungs specific for amiodarone pulmonary toxicity. 3. Mild to moderate mediastinal and mild bilateral hilar lymphadenopathy,  stable since 09/25/2016, suggesting reactive/congestive adenopathy. 4. Three-vessel coronary atherosclerosis.  Aortic Atherosclerosis (ICD10-I70.0).   Electronically Signed   By: Ilona Sorrel M.D.   On: 01/18/2017 16:41    OV 04/05/2017  Chief Complaint  Patient presents with  . Follow-up    Pt states that she went to the mountains this  past weekend 9/29-9/30 and breathing was a little labored then. Pt is working on losing weight which is helping with her breathing. Still becomes SOB on exertion and c/o sinus drainage. Denies any cough or CP.    259# -> 252# -> 245# with [place]   has a past medical history of Anticoagulant long-term use; Anxiety; Arthritis; CAD (coronary artery disease) (4782,9562); CHF (congestive heart failure) ([place]); Chronic atrial fibrillation ([place]) (06/2007); Chronic kidney disease; Depression; Diplopia (06/19/2008); Dysrhythmia; Edema of lower extremity; History of acute inferior wall MI; History of CVA (cerebrovascular accident); Hyperlipidemia; Hypertension; Myocardial infarction [facility]) (1308,6578); OSA on CPAP; Pacemaker; Pneumonia (2014); Shortness of breath; Stroke [facility]) (4696,2952); TIA (transient ischemic attack); Transfusion history; and Unspecified hemorrhoids without mention of complication (8/41/3244).   reports that she quit smoking about 34 years ago. Her smoking use included Cigarettes. She started smoking about 38 years ago. She has a 5.00 pack-year smoking history. She has never used smokeless tobacco.  Past Surgical History:  Procedure Laterality Date  . APPENDECTOMY    . ATRIAL FIBRILLATION ABLATION N/A 09/29/2016   Procedure: Atrial Fibrillation Ablation;  Surgeon: Will Meredith Leeds, MD;  Location: [place] [facility];  Service: Cardiovascular;  Laterality: N/A;  . back surgeries     x2-3 by Dr Trenton Gammon  . BACK SURGERY    . CATARACT EXTRACTION Left 01/15/2017  . CHOLECYSTECTOMY  1994  . CORONARY ANGIOPLASTY WITH STENT PLACEMENT  1998  . CORONARY STENT PLACEMENT     C stent  . DOPPLER ECHOCARDIOGRAPHY  2009  . ELECTROPHYSIOLOGIC STUDY N/A 03/31/2016   Procedure: Cardioversion;  Surgeon: Evans Lance, MD;  Location: [place] [facility];  Service: Cardiovascular;  Laterality: N/A;  . ELECTROPHYSIOLOGIC STUDY N/A 08/04/2016   Procedure: Cardioversion;  Surgeon: Evans Lance, MD;  Location: [place] [facility];  Service: Cardiovascular;  Laterality: N/A;  . LEFT AND RIGHT HEART CATHETERIZATION WITH CORONARY ANGIOGRAM N/A 10/06/2013   Procedure: LEFT AND RIGHT HEART CATHETERIZATION WITH CORONARY ANGIOGRAM;  Surgeon: Troy Sine, MD;  Location: [facility] C[facility] LAB;  Service: Cardiovascular;  Laterality: N/A;  . permanent pacemaker  06/2007  . TEE WITHOUT CARDIOVERSION N/A 09/29/2016   Procedure: TRANSESOPHAGEAL ECHOCARDIOGRAM (TEE);  Surgeon: Jerline Pain, MD;  Location: [facility] ENDOSCOPY;  Service: Cardiovascular;  Laterality: N/A;  . TOTAL ABDOMINAL HYSTERECTOMY  1984   2 surgeries endometriosis bso    Allergies  Allergen Reactions  . Adhesive [Tape]     Allergic to EKG stickers and defibrillation pads.    Immunization History  Administered Date(s) Administered  . Influenza Split 04/18/2011, 05/06/2012  . Influenza Whole 07/04/2007, 06/02/2009, 03/01/2010  . Influenza, High Dose Seasonal PF 04/03/2014, 04/18/2016  . Influenza,inj,Quad PF,6+ Mos 03/07/2013, 04/06/2015  . Pneumococcal Conjugate-13 09/24/2013  . Pneumococcal Polysaccharide-23 07/03/2005, 07/04/2007, 04/18/2011  . Td 06/02/2009  . Zoster 07/03/2006    Family History  Problem Relation Age of Onset  . Suicidality Father        suicide death pt was 3 yrs  . Arrhythmia Mother   . Hypertension Mother   . Diabetes Mother   . Heart attack Brother 48  . Heart disease Paternal  Aunt   . Prostate cancer Maternal Grandfather   . Diabetes Paternal Grandfather        fathers side of the family     Current Outpatient Prescriptions:  .  acetaminophen (TYLENOL) 325 MG tablet, Take 2 tablets (650 mg total) by mouth every 4 (four) hours as needed for headache or mild pain. (Patient taking differently: Take 650 mg by mouth every morning. ), Disp: , Rfl:  .  amiodarone (PACERONE) 200 MG tablet, Take 1 tablet (200 mg total) by mouth daily., Disp: 30 tablet, Rfl: 11 .  Ascorbic Acid (VITAMIN C) 1000 MG tablet, Take 1,000 mg by mouth  daily., Disp: , Rfl:  .  atorvastatin (LIPITOR) 40 MG tablet, TAKE 1 TABLET BY MOUTH EVERY DAY, Disp: 90 tablet, Rfl: 2 .  Calcium Carbonate-Vitamin D (CALCIUM 600+D PO), Take 1 tablet by mouth daily., Disp: , Rfl:  .  Cyanocobalamin (B-12) 5000 MCG CAPS, Take 5,000 mcg by mouth at bedtime. , Disp: , Rfl:  .  diltiazem (CARDIZEM CD) 120 MG 24 hr capsule, Take 1 capsule (120 mg total) by mouth daily., Disp: 30 capsule, Rfl: 11 .  diphenhydramine-acetaminophen (TYLENOL PM) 25-500 MG TABS tablet, Take 2 tablets by mouth at bedtime as needed (pain/sleep)., Disp: , Rfl:  .  ELIQUIS 5 MG TABS tablet, TAKE 1 TABLET BY MOUTH TWICE A DAY, Disp: 60 tablet, Rfl: 11 .  FLUoxetine (PROZAC) 20 MG capsule, TAKE ONE CAPSULE BY MOUTH EVERY MORNING, Disp: 90 capsule, Rfl: 1 .  fluticasone (FLONASE) 50 MCG/ACT nasal spray, Place 1 spray into both nostrils daily as needed for allergies or rhinitis., Disp: , Rfl:  .  furosemide (LASIX) 80 MG tablet, Take 1 tablet (80 mg total) by mouth 2 (two) times daily., Disp: 60 tablet, Rfl: 11 .  loratadine (CLARITIN) 10 MG tablet, Take 1 tablet (10 mg total) by mouth daily., Disp: 30 tablet, Rfl: 5 .  Lysine HCl 1000 MG TABS, Take 1,000 mg by mouth daily. , Disp: , Rfl:  .  metoprolol succinate (TOPROL-XL) 50 MG 24 hr tablet, Take 1 tablet (50 mg total) by mouth 2 (two) times daily. Take with or immediately following a meal., Disp: 60 tablet, Rfl: 11 .  Multiple Vitamin (MULTIVITAMIN WITH MINERALS) TABS tablet, Take 1 tablet by mouth daily. Centrum, Disp: , Rfl:  .  nitroGLYCERIN (NITROSTAT) 0.4 MG SL tablet, Place 1 tablet (0.4 mg total) under the tongue every 5 (five) minutes x 3 doses as needed for chest pain., Disp: 25 tablet, Rfl: 6 .  pantoprazole (PROTONIX) 40 MG tablet, Take 1 tablet (40 mg total) by mouth daily., Disp: 30 tablet, Rfl: 2 .  Polyethyl Glycol-Propyl Glycol (SYSTANE OP), Place 1 drop into both eyes daily as needed (dry eyes)., Disp: , Rfl:  .  PRESCRIPTION  MEDICATION, Inhale into the lungs at bedtime. CPAP, Disp: , Rfl:  .  vitamin E 1000 UNIT capsule, Take 1,000 Units by mouth daily., Disp: , Rfl:  .  potassium chloride SA (KLOR-CON M20) 20 MEQ tablet, Take 1 tablet (20 mEq total) by mouth daily. (Patient taking differently: Take 20 mEq by mouth 2 (two) times daily. ), Disp: 180 tablet, Rfl: 3   Review of Systems     Objective:   Physical Exam  Constitutional: She is oriented to person, place, and time. She appears well-developed and well-nourished. No distress.  Obese  Looks euvolemic  HENT:  Head: Normocephalic and atraumatic.  Right Ear: External ear normal.  Left Ear: External  ear normal.  Mouth/Throat: Oropharynx is clear and moist. No oropharyngeal exudate.  Eyes: Pupils are equal, round, and reactive to light. Conjunctivae and EOM are normal. Right eye exhibits no discharge. Left eye exhibits no discharge. No scleral icterus.  Neck: Normal range of motion. Neck supple. No JVD present. No tracheal deviation present. No thyromegaly present.  Cardiovascular: Normal rate, regular rhythm, normal heart sounds and intact distal pulses.  Exam reveals no gallop and no friction rub.   No murmur heard. Pulmonary/Chest: Effort normal. No respiratory distress. She has no wheezes. She has rales. She exhibits no tenderness.  Anterior crackles + Bilateral bibasal crackles+  Abdominal: Soft. Bowel sounds are normal. She exhibits no distension and no mass. There is no tenderness. There is no rebound and no guarding.  Musculoskeletal: Normal range of motion. She exhibits no edema or tenderness.  Only trace edema  Lymphadenopathy:    She has no cervical adenopathy.  Neurological: She is alert and oriented to person, place, and time. She has normal reflexes. No cranial nerve deficit. She exhibits normal muscle tone. Coordination normal.  Skin: Skin is warm and dry. No rash noted. She is not diaphoretic. No erythema. No pallor.  Psychiatric: She has  a normal mood and affect. Her behavior is normal. Judgment and thought content normal.  Vitals reviewed.   Vitals:   04/05/17 0912  BP: 130/76  Pulse: 60  SpO2: 92%  Weight: 245 lb 9.6 oz (111.4 kg)  Height: 5' 6.5" (1.689 m)    Estimated body mass index is 39.05 kg/m as calculated from the following:   Height as of this encounter: 5' 6.5" (1.689 m).   Weight as of this encounter: 245 lb 9.6 oz (111.4 kg).      Assessment:       ICD-10-CM   1. Dyspnea and respiratory abnormalities R06.00    R06.89   2. Bibasilar crackles R09.89   3. On amiodarone therapy Z79.899   4. Chronic diastolic heart failure- grade 2 diastolic dysfunction by echo 10/03/13 I50.32   5. Abnormal PFTs (pulmonary function tests) R94.2        Plan:      I still hear crackles even though you have lost weight and feel better with lasix and diet (she looks euvolemic as well) So, I am still concerned that you might have pulmonary fibrosis Because, you are having knee surgery 04/23/17 I want to get CT chest next few to several days  PLAN - wil not walk you due to knee issues - High Resolution CT chest without contrast on ILD protocol. DO both Supine and Prone Images. Only  Dr Lorin Picket or Dr. Vinnie Langton to read.  - high dose flu shot 04/05/2017 - Please talk to PCP [name], [name], MD -  and ensure you get  shingarix vaccine - decide on knee surgery pre-op clearance after ct chest but should be ok  Followup Mgiht call for blood work depending on CT results Might consider right heart cath if unable to ddx CHF vILD 3 months or sooner if needed  (> 50% of this 15 min visit spent in face to face counseling or/and coordination of care)  Dr. Brand Males, M.D., [facility].C.P Pulmonary and Critical Care Medicine Staff Physician [facility] Pager: [phone], If no answer or between  15:00h - 7:00h: call [phone]  04/05/2017 9:37 AM

## 2017-04-09 ENCOUNTER — Ambulatory Visit (INDEPENDENT_AMBULATORY_CARE_PROVIDER_SITE_OTHER)
Admission: RE | Admit: 2017-04-09 | Discharge: 2017-04-09 | Disposition: A | Discharge status: Home / Self Care | Payer: PPO | Source: Ambulatory Visit | Attending: Internal Medicine | Admitting: Internal Medicine

## 2017-04-09 DIAGNOSIS — R0689 Other abnormalities of breathing: Secondary | ICD-10-CM

## 2017-04-09 DIAGNOSIS — R0600 Dyspnea, unspecified: Secondary | ICD-10-CM

## 2017-04-09 DIAGNOSIS — R0602 Shortness of breath: Secondary | ICD-10-CM | POA: Diagnosis not present

## 2017-04-12 NOTE — Pre-Procedure Instructions (Signed)
Amela C Leising  04/12/2017      [id number] [place] [place] - [address] [place] [address] Phone: [phone] Fax: [phone]  [facility] [place] [facility] [place] [address] Phone: [phone] Fax: [phone]    Your procedure is scheduled on October 22  Report to [facility] at 0800 A.M.  Call this number if you have problems the morning of surgery:  [phone]   Remember:  Do not eat food or drink liquids after midnight.  Continue all other medications as directed by your physician except follow these medication instructions before surgery   Take these medicines the morning of surgery with A SIP OF WATER  acetaminophen (TYLENOL)  amiodarone (PACERONE)  diltiazem (CARDIZEM CD FLUoxetine (PROZAC)  fluticasone (FLONASE)  loratadine (CLARITIN) metoprolol succinate (TOPROL-XL) pantoprazole (PROTONIX Eye drops if needed  7 days prior to surgery STOP taking any Aspirin (unless otherwise instructed by your surgeon), Aleve, Naproxen, Ibuprofen, Motrin, Advil, Goody's, BC's, all herbal medications, fish oil, and all vitamins  FOLLOW PHYSICIANS INSTRUCTIONS ABOUT ELIQUIS   Do not wear jewelry, make-up or nail polish.  Do not wear lotions, powders, or perfumes, or deoderant.  Do not shave 48 hours prior to surgery.  Men may shave face and neck.  Do not bring valuables to the hospital.  [facility] is not responsible for any belongings or valuables.  Contacts, dentures or bridgework may not be worn into surgery.  Leave your suitcase in the car.  After surgery it may be brought to your room.  For patients admitted to the hospital, discharge time will be determined by your treatment team.  Patients discharged the day of surgery will not be allowed to drive home.    Special instructions:   [place]- Preparing For  Surgery  Before surgery, you can play an important role. Because skin is not sterile, your skin needs to be as free of germs as possible. You can reduce the number of germs on your skin by washing with CHG (chlorahexidine gluconate) Soap before surgery.  CHG is an antiseptic cleaner which kills germs and bonds with the skin to continue killing germs even after washing.  Please do not use if you have an allergy to CHG or antibacterial soaps. If your skin becomes reddened/irritated stop using the CHG.  Do not shave (including legs and underarms) for at least 48 hours prior to first CHG shower. It is OK to shave your face.  Please follow these instructions carefully.   1. Shower the NIGHT BEFORE SURGERY and the MORNING OF SURGERY with CHG.   2. If you chose to wash your hair, wash your hair first as usual with your normal shampoo.  3. After you shampoo, rinse your hair and body thoroughly to remove the shampoo.  4. Use CHG as you would any other liquid soap. You can apply CHG directly to the skin and wash gently with a scrungie or a clean washcloth.   5. Apply the CHG Soap to your body ONLY FROM THE NECK DOWN.  Do not use on open wounds or open sores. Avoid contact with your eyes, ears, mouth and genitals (private parts). Wash Face and genitals (private parts)  with your normal soap.  6. Wash thoroughly, paying special attention to the area where your surgery will be performed.  7. Thoroughly rinse your body with warm  water from the neck down.  8. DO NOT shower/wash with your normal soap after using and rinsing off the CHG Soap.  9. Pat yourself dry with a CLEAN TOWEL.  10. Wear CLEAN PAJAMAS to bed the night before surgery, wear comfortable clothes the morning of surgery  11. Place CLEAN SHEETS on your bed the night of your first shower and DO NOT SLEEP WITH PETS.    Day of Surgery: Do not apply any deodorants/lotions. Please wear clean clothes to the hospital/surgery center.       Please read over the following fact sheets that you were given.

## 2017-04-13 ENCOUNTER — Encounter (HOSPITAL_COMMUNITY)
Admission: RE | Admit: 2017-04-13 | Discharge: 2017-04-13 | Disposition: A | Discharge status: Home / Self Care | Payer: PPO | Source: Ambulatory Visit | Attending: Orthopedic Surgery | Admitting: Orthopedic Surgery

## 2017-04-13 ENCOUNTER — Encounter (HOSPITAL_COMMUNITY): Payer: Self-pay

## 2017-04-13 ENCOUNTER — Ambulatory Visit (HOSPITAL_COMMUNITY)
Admission: RE | Admit: 2017-04-13 | Discharge: 2017-04-13 | Disposition: A | Discharge status: Home / Self Care | Payer: PPO | Source: Ambulatory Visit | Attending: Orthopedic Surgery | Admitting: Orthopedic Surgery

## 2017-04-13 DIAGNOSIS — R609 Edema, unspecified: Secondary | ICD-10-CM | POA: Diagnosis not present

## 2017-04-13 DIAGNOSIS — Z01818 Encounter for other preprocedural examination: Secondary | ICD-10-CM

## 2017-04-13 DIAGNOSIS — M1712 Unilateral primary osteoarthritis, left knee: Secondary | ICD-10-CM | POA: Insufficient documentation

## 2017-04-13 DIAGNOSIS — Z01812 Encounter for preprocedural laboratory examination: Secondary | ICD-10-CM | POA: Insufficient documentation

## 2017-04-13 DIAGNOSIS — I509 Heart failure, unspecified: Secondary | ICD-10-CM | POA: Insufficient documentation

## 2017-04-13 HISTORY — DX: Pulmonary hypertension, unspecified: I27.20

## 2017-04-13 LAB — CBC WITH DIFFERENTIAL/PLATELET
Basophils Absolute: 0.1 10*3/uL (ref 0.0–0.1)
Basophils Relative: 1 %
Eosinophils Absolute: 0.4 10*3/uL (ref 0.0–0.7)
Eosinophils Relative: 4 %
HCT: 42.5 % (ref 36.0–46.0)
Hemoglobin: 13.4 g/dL (ref 12.0–15.0)
Lymphocytes Relative: 21 %
Lymphs Abs: 2 10*3/uL (ref 0.7–4.0)
MCH: 30.7 pg (ref 26.0–34.0)
MCHC: 31.5 g/dL (ref 30.0–36.0)
MCV: 97.3 fL (ref 78.0–100.0)
Monocytes Absolute: 0.9 10*3/uL (ref 0.1–1.0)
Monocytes Relative: 9 %
Neutro Abs: 6.1 10*3/uL (ref 1.7–7.7)
Neutrophils Relative %: 65 %
Platelets: 317 10*3/uL (ref 150–400)
RBC: 4.37 MIL/uL (ref 3.87–5.11)
RDW: 14.5 % (ref 11.5–15.5)
WBC: 9.4 10*3/uL (ref 4.0–10.5)

## 2017-04-13 LAB — BASIC METABOLIC PANEL
Anion gap: 9 (ref 5–15)
BUN: 26 mg/dL — ABNORMAL HIGH (ref 6–20)
CO2: 28 mmol/L (ref 22–32)
Calcium: 9 mg/dL (ref 8.9–10.3)
Chloride: 99 mmol/L — ABNORMAL LOW (ref 101–111)
Creatinine, Ser: 0.99 mg/dL (ref 0.44–1.00)
GFR calc Af Amer: >60 mL/min (ref >60)
GFR calc non Af Amer: 56 mL/min — ABNORMAL LOW (ref >60)
Glucose, Bld: 101 mg/dL — ABNORMAL HIGH (ref 65–99)
Potassium: 3.8 mmol/L (ref 3.5–5.1)
Sodium: 136 mmol/L (ref 135–145)

## 2017-04-13 LAB — TYPE AND SCREEN
ABO/RH(D): A POS
Antibody Screen: NEGATIVE

## 2017-04-13 LAB — URINALYSIS, ROUTINE W REFLEX MICROSCOPIC
Bilirubin Urine: NEGATIVE
Glucose, UA: NEGATIVE mg/dL
Hgb urine dipstick: NEGATIVE
Ketones, ur: NEGATIVE mg/dL
Leukocytes, UA: NEGATIVE
Nitrite: NEGATIVE
Protein, ur: NEGATIVE mg/dL
Specific Gravity, Urine: 1.014 (ref 1.005–1.030)
pH: 5 (ref 5.0–8.0)

## 2017-04-13 LAB — SURGICAL PCR SCREEN
MRSA, PCR: NEGATIVE
Staphylococcus aureus: NEGATIVE

## 2017-04-13 LAB — APTT: aPTT: 32 seconds (ref 24–36)

## 2017-04-13 LAB — PROTIME-INR
INR: 1.51
Prothrombin Time: 18.1 seconds — ABNORMAL HIGH (ref 11.4–15.2)

## 2017-04-13 NOTE — Progress Notes (Addendum)
Aaron Edelman notified of surgery,and form faxed to Dr Lovena Le.  Will f/u with PA.  Also pt is calling MD today req. Eliquis.

## 2017-04-14 ENCOUNTER — Other Ambulatory Visit: Payer: Self-pay | Admitting: Cardiovascular Disease

## 2017-04-16 ENCOUNTER — Encounter (HOSPITAL_COMMUNITY): Payer: Self-pay

## 2017-04-16 ENCOUNTER — Telehealth: Payer: Self-pay | Admitting: *Deleted

## 2017-04-16 ENCOUNTER — Telehealth: Payer: Self-pay | Admitting: Internal Medicine

## 2017-04-16 NOTE — Telephone Encounter (Signed)
Chart already addressed. Will route information on pre-op clearance below to requesting provider. Aairah Negrette PA-C

## 2017-04-16 NOTE — Telephone Encounter (Signed)
   [facility] Pre-operative Risk Assessment    Request for surgical clearance:  1. What type of surgery is being performed? Left TKA  2. When is this surgery scheduled? 10.22.2018  3. Are there any medications that need to be held prior to surgery and how long?:Medication/Cardiac: Needs to be off Eliquis 3 days prior to surgery. Is this ok?  4. Practice name and name of physician performing surgery?  [facility] Frederik Pear, MD  5. What is your office phone and fax number? [place]: [phone] LT:[phone]  2. Anesthesia type (None, local, MAC, general) ? None    [place], [place] R 04/16/2017, 3:05 PM  _________________________________________________________________   (provider comments below)

## 2017-04-16 NOTE — Telephone Encounter (Signed)
Patient with diagnosis of atrial fibrillation on Eliquis for anticoagulation.    Procedure: TKA Date of procedure: 04-23-17  CHADS2 score of 4 (HTN, DM2, stroke/tia x 2);  CHADS2-VASc score of  7 (HTN, AGE, DM2, stroke/tia x 2, CAD, female)  CrCl 91.5 Platelet count 317  Per office protocol, patient can hold Eliquis for 2 days prior to procedure. (patient has history of prior strokes x 2)  Patient should restart Eliquis on the evening of procedure or day after, at discretion of procedure MD.

## 2017-04-16 NOTE — Progress Notes (Addendum)
Anesthesia Chart Review: Patient is a 71 year old female scheduled for left TKA on 04/23/17 by Dr. Frederik Pear.  History includes CAD/inferior MI s/p PCI ~ '98 and s/p DES mid RCA 12/02/05, afib s/p DCCV 03/31/16 and s/p AF ablation 7/32/20, chronic diastolic CHF, tachybradycardia syndrome s/p [employer] dual chamber PPM 06/13/07, CVA, HTN, HLD, dyspnea, anxiety, depression, CKD, OSA (on CPAP), arthritis, L4-5 fusion, reexploration L4-5 fusion and L3-4 posterolateral arthrodesis 11/16/03, L2-3 PLIF and removal of hardware L3-4 04/05/15, hysterectomy, appendectomy, cholecystectomy. 10/2016 echo and 2015 cath showed moderate pulmonary hypertension. 04/09/17 CT showed evidence of cirrhosis. BMI is consistent with obesity.  - Admission 11/12/16-11/15/16 for acute on chronic diastolic CHF.  - PCP is Dr. [name]. Last visit 01/23/17.  - Cardiologist is Dr. Grayland Jack. Last visit 11/21/16. She was not having angina at that time. - EP is Dr. Allegra Lai. Last visit 03/21/17. She is not pacemaker dependent. - Pulmonologist is Dr. Brand Males, last visit 04/05/17. He recommended a high resolution CT chest per ILD protocol prior to clearing for knee surgery. If chest CT unclear about CHF or ILD then would consider RHC. 04/09/17 CT did not show ILD, but pattern more consistent with CHF. I have left a message for Dr. Chase Caller regarding pulmonary clearance status.   Meds include amiodarone, Lipitor, diltiazem, Eliquis, Prozac, Flonase, Lasix, Claritin, Lysine, Toprol XL, Nitro, Protonix, KCl, Timoptic ophthalmic. Surgeon is awaiting instructions from cardiology regarding Eliquis, but have requested permission to have it held for three days.   BP (!) 130/49   Pulse (!) 59   Temp 36.8 C (Oral)   Resp 18   Ht 5' 6.5" (1.689 m)   Wt 245 lb 1 oz (111.2 kg)   SpO2 92%   BMI 38.96 kg/m   EKG 03/21/17: SR with first degree AV block.  Echo 10/16/16: Study Conclusions - Left ventricle: The cavity size was normal.  There was moderate   concentric hypertrophy. Systolic function was normal. The   estimated ejection fraction was in the range of 55% to 60%. Wall   motion was normal; there were no regional wall motion   abnormalities. Features are consistent with a pseudonormal left   ventricular filling pattern, with concomitant abnormal relaxation   and increased filling pressure (grade 2 diastolic dysfunction).   Doppler parameters are consistent with high ventricular filling   pressure. - Aortic valve: Trileaflet; mildly thickened, mildly calcified   leaflets. - Mitral valve: Calcified annulus. There was mild regurgitation. - Left atrium: The atrium was moderately dilated. - Pulmonary arteries: PA peak pressure: 53 mm Hg (S). Impressions: - The right ventricular systolic pressure was increased consistent   with moderate pulmonary hypertension.  CT cardiac 09/25/16: IMPRESSION: 1.  Pulmonary veins as described above.  No anomalies noted. LUPV 19 x 17 mm LLPV 19 x 18.5 mm RUPV 21 x 19 mm RLPV 20 x 14.5 mm 2.  Suspect left atrial appendage thrombus.  Suggest TEE to confirm. 3. Suspect mild stenoses in the proximal LAD and proximal LCx. Long stented segment in the proximal to mid RCA, suspect patent (cannot totally rule out significant in-stent restenosis but suspect shadowing artifact is present).  TEE 09/29/16: Findings:  - NO LA or LAA mass/thrombus.  - Prominent LA ridge posterior to LAA  - Normal EF  - Mild MR  - Pacer wires in place.   - Negative bubble study (no shunt).  Cardiac cath 10/06/13: IMPRESSION: 1. Left main: Angiographically normal and bifurcated into the  LAD and left circumflex vessel. 2. LAD: Appeared angiographically normal. Between the first diagonal and second diagonal vessel there appeared to be a stent versus calcification. This was widely patent. There was a prominent proximal septal perforator artery which supplied collaterals to a small RV marginal branch of the  RCA 3. Left circumflex: normal 4. Right coronary artery: Tandem stents were in place extending from the ostium the mid RCA. There was mild irregularity with narrowing of 10-20% within the stented segment. The small RV marginal branch branch which was collateralized from the septals arising from the LAD was not visualized antegrade. The distal RCA beyond the stented segment was free of disease and gave rise to a PDA and PLA vessel. 5. Left ventriculography revealed mild left ventricular hypertrophy. Ejection fraction estimate was approximately 55%. There was a small area mid-distal inferior hypocontractility. IMPRESSION: - Preserved global LV function the estimated ejection fraction of 55% and evidence for a small area of mild mid-distal inferior hypocontractility - No significant residual coronary obstructive disease with the appearance of a patent stent in the LAD, normal left circumflex artery, and patent tandem stance in the RCA extending from the ostium to the mid segment. There is faint collateralization of a small RV marginal branch which is supplied via septal collaterals arising from the LAD. - Moderate pulmonary hypertension following recent aggressive diuresis with a PA systolic pressure of 57 mm.  CT Chest High Resolution 04/09/17: IMPRESSION: 1. No findings to suggest interstitial lung disease. 2. The appearance of the chest remains most compatible with congestive heart failure, as discussed above. ("findings remain most compatible with mild to moderate pulmonary edema") 3. Aortic atherosclerosis, in addition to 3 vessel coronary artery disease. Assessment for potential risk factor modification, dietary therapy or pharmacologic therapy may be warranted, if clinically indicated. 4. Cirrhosis. 5. Small angiomyolipoma in the left kidney incidentally noted. 6. Additional incidental findings, as above. (See full report under Results Review)  PFTs 11/03/16: FVC 1.38 (41%), FEV1 1.12 (44%),  DLCO unc 14.71 (53%).  Interpretation: Although the FEV1 and FEF25-75% are reduced, the FEV1/FVC ratio is increased. The airway resistance is normal. The TLC and SVC are reduced, but the FRC is normal. Following administration of bronchodilators, there is a slight response. The reduced diffusing capacity indicates a moderately severe loss of functional alveolar capillary surface. Conclusions: Severe airway obstruction is present. The reduced lung volumes, increased FEV1/FVC ratio and diffusion defect suggest an interstitial process such as fibrosis or interstitial inflammation. A clinical trial of bronchodilators may be beneficial in view of the airway obstruction. In view of the severity of the diffusion defect, studies with exercise would be helpful to evaluate the presence of hypoxemia. Pulmonary Function Diagnosis: Severe Obstructive Airways Disease Response to bronchodilator Severe Restriction -Interstitial Moderately severe Diffusion Defect  According to Dr. Golden Pop 04/05/17 note,  "Walking desaturation test 185 feet 3 laps on room air: She walk only one lap and stopped due to knee pain she is upcoming knee replacement. A pulse ox did go down to 93% but it was still adequate."  Preoperative labs noted. Cr 0.99. Glucose 101. CBC WNL. PT 18.1, INR 1.51, PTT 32. T&S done. UA WNL. AST, ALT WNL 01/23/17.   I have sent a staff message to Dr. Acie Fredrickson regarding recent CT findings. We are also awaiting input regarding Eliquis and clearance from cardiology. Chart will be left for follow-up.  George Hugh [facility] Short Stay Center/Anesthesiology Phone [phone] 04/16/2017 4:27 PM  Addendum: I have communicated with  Dr. Acie Fredrickson regarding CT findings. He felt they could follow her after TKA and further address liver issues. He did give permission to hold Eliquis for three days since spinal anesthesia would be considered. (On 04/17/17, I notified Sandi Raveling at Dr. Damita Dunnings office, and  she will let the patient know to hold Eliquis for 3 days.) I am still awaiting pulmonology input--was told Dr. Golden Pop nurse would contact me tomorrow.  George Hugh [facility] Short Stay Center/Anesthesiology Phone [phone] 04/19/2017 3:56 PM  Addendum: 04/09/17 chest CT results reviewed by Dr. Chase Caller. He would like cardiology to consider [place]. Clinically he feels she has pulmonary fibrosis (he felt she was euvolemic at last office visit), but radiologist felt CT findings more consistent with CHF. RHC could hopefully clarify etiology. I notified him that I had already communicated chest CT results with Dr. Acie Fredrickson and his plan for cardiology to see patient after her surgery. I also called and spoke with Leanor Kail, PA-C (who completed cardiology pre-operative evaluation, see telephone encounter 04/16/17). He reiterated that patient remains cleared from a cardiac standpoint and would defer to pulmonology whether [place] would be indicated prior to surgery. I called and spoke with Dr. Chase Caller. As stated, he does feel that patient likely has pulmonary fibrosis although not seen on imaging. If she did have ILD, she would be at risk for post-operative flare. Regardless, with known CHF she is also at risk for perioperative CHF flare. He wants to make sure her surgical/anesthesia team discuss this with her. He would strongly prefer that spinal anesthesia be utilized (over general anesthesia) to decrease her risk. Aggressive postoperative pulmonary toilet also recommended. I have communicated above with anesthesiologist Dr. Tamela Gammon and also attempted to call Dr. Mayer Camel on his cell phone, but number given by office was incorrect so I will send him an urgent staff message. or review my notes for his second case. Definitive anesthesia plan following anesthesiologist evaluation on the day of surgery.  George Hugh [facility] Short Stay Center/Anesthesiology Phone [phone] 04/20/2017  5:32 PM

## 2017-04-16 NOTE — Telephone Encounter (Signed)
   Chart reviewed as part of pre-operative protocol coverage. The patient was last seen by Dr. Curt Bears on 03/21/17. Per patient, Dr. Curt Bears has cleared the patient for surgery however no documentation. Last seen by Dr. Acie Fredrickson on 11/21/16. RCRI risk is 6.6%. [facility] Activity status indux 7.59METS without angina.  [name] would be at acceptable risk for the planned procedure without further cardiovascular testing. Pharmacy is reviewing the patient's chart. Will send information to surgeon once reviewed by pharmacy.   [place], PA 04/16/2017, 3:38 PM

## 2017-04-16 NOTE — Telephone Encounter (Signed)
PLAN - wil not walk you due to knee issues - High Resolution CT chest without contrast on ILD protocol. DO both Supine and Prone Images. Only  Dr Lorin Picket or Dr. Vinnie Langton to read.  - high dose flu shot 04/05/2017 - Please talk to PCP [name], [name], MD -  and ensure you get  shingarix vaccine - decide on knee surgery pre-op clearance after ct chest but should be ok  Followup Mgiht call for blood work depending on CT results 3 months or sooner if needed  MR can you advise after reviewing CT scan.

## 2017-04-19 DIAGNOSIS — M1712 Unilateral primary osteoarthritis, left knee: Secondary | ICD-10-CM | POA: Diagnosis present

## 2017-04-19 NOTE — H&P (Signed)
TOTAL KNEE ADMISSION H&P  Patient is being admitted for left total knee arthroplasty.  Subjective:  Chief Complaint:left knee pain.  HPI: [name], 71 y.o. female, has a history of pain and functional disability in the left knee due to arthritis and has failed non-surgical conservative treatments for greater than 12 weeks to includeNSAID's and/or analgesics, corticosteriod injections, viscosupplementation injections, flexibility and strengthening excercises, use of assistive devices, weight reduction as appropriate and activity modification.  Onset of symptoms was gradual, starting 2 years ago with gradually worsening course since that time. The patient noted no past surgery on the left knee(s).  Patient currently rates pain in the left knee(s) at 10 out of 10 with activity. Patient has night pain, worsening of pain with activity and weight bearing, pain that interferes with activities of daily living, pain with passive range of motion, crepitus and joint swelling.  Patient has evidence of periarticular osteophytes and joint space narrowing by imaging studies.   There is no active infection.  Patient Active Problem List   Diagnosis Date Noted  . Atypical atrial flutter ([place]) 09/30/2016  . Hematoma 09/30/2016  . Hypotension 09/30/2016  . Hyperlipidemia   . HLD (hyperlipidemia)   . Controlled type 2 diabetes mellitus without complication ([place])   . Acute respiratory failure with hypoxia ([place]) 03/26/2016  . DDD (degenerative disc disease), lumbar 04/05/2015  . Degenerative disc disease, lumbar 04/05/2015  . Severe obesity (BMI >= 40) ([place]) 02/16/2015  . Fatty liver disease, nonalcoholic 94/85/4627  . Cough, persistent 04/03/2014  . Acute on chronic diastolic congestive heart failure ([place]) 10/07/2013  . Pre-diabetes 09/24/2013  . Peripheral edema 08/05/2013  . Neoplasm of uncertain behavior of skin of back 11/09/2012  . Edema 11/04/2012  . Bleeding mole 11/04/2012  . Pulmonary  hypertension ([place]) 06/11/2012  . Pneumonia 06/05/2012  . Hypoxia 06/05/2012  . Anemia 06/05/2012  . Hemorrhoids 05/11/2012  . Dyspepsia 05/11/2012  . Medication management 04/20/2011  . Positional vertigo 10/07/2010  . Anticoagulant long-term use   . HERPES LABIALIS 12/30/2009  . INTERTRIGO 12/30/2009  . Obesity 06/02/2009  . Obstructive sleep apnea 10/12/2008  . HYPERLIPIDEMIA 09/15/2008  . SNORING 09/15/2008  . Essential hypertension 06/19/2008  . CAD S/P percutaneous coronary angioplasty 06/19/2008  . Paroxysmal atrial fibrillation ([place]) 06/19/2008  . BRADYCARDIA-TACHYCARDIA SYNDROME 06/19/2008  . CEREBRAL ANEURYSM 06/19/2008  . ALLERGIC RHINITIS 06/19/2008  . HYPERGLYCEMIA 06/19/2008  . DEPRESSION, HX OF 06/19/2008  . CEREBROVASCULAR ACCIDENT, HX OF 06/19/2008  . PACEMAKER, PERMANENT 06/19/2008   Past Medical History:  Diagnosis Date  . Anticoagulant long-term use    pradaxa  . Anxiety   . Arthritis    low back & both knees, Spondylolisthesis    . CAD (coronary artery disease) 0350,0938   post PTCA with bare-metal stenting to mid RCA in December 2004     . CHF (congestive heart failure) ([place])   . Chronic atrial fibrillation ([place]) 06/2007   Tachybradycardia pacemaker  . Chronic kidney disease    10% function - ?R, other kidney is compensating    . Depression   . Diplopia 06/19/2008   Qualifier: Diagnosis of  By: [name] MD, [name]   . Dysrhythmia    ATRIAL FIBRILATION  . Edema of lower extremity   . History of acute inferior wall MI   . History of CVA (cerebrovascular accident)   . Hyperlipidemia   . Hypertension   . Myocardial infarction ([place]) [id number]  . OSA on CPAP    last test-  2010  . Pacemaker   . Pneumonia 2014   tx. ----  [facility]  . Pulmonary hypertension ([place])    moderate pulmonary hypertension by 10/2016 echo and 10/2013 cardiac cath  . Shortness of breath   . Stroke ([place]) [id number]  . TIA (transient ischemic attack)   . Transfusion  history   . Unspecified hemorrhoids without mention of complication 12/14/4313   Colonoscopy--Dr. Carlean Purl     Past Surgical History:  Procedure Laterality Date  . APPENDECTOMY    . ATRIAL FIBRILLATION ABLATION N/A 09/29/2016   Procedure: Atrial Fibrillation Ablation;  Surgeon: Will Meredith Leeds, MD;  Location: [place] CV LAB;  Service: Cardiovascular;  Laterality: N/A;  . back surgeries     x2-3 by Dr [place] Gammon  . BACK SURGERY    . CATARACT EXTRACTION Left 01/15/2017  . CHOLECYSTECTOMY  1994  . CORONARY ANGIOPLASTY WITH STENT PLACEMENT  1998  . CORONARY STENT PLACEMENT     C stent  . DOPPLER ECHOCARDIOGRAPHY  2009  . ELECTROPHYSIOLOGIC STUDY N/A 03/31/2016   Procedure: Cardioversion;  Surgeon: Evans Lance, MD;  Location: [place] CV LAB;  Service: Cardiovascular;  Laterality: N/A;  . ELECTROPHYSIOLOGIC STUDY N/A 08/04/2016   Procedure: Cardioversion;  Surgeon: Evans Lance, MD;  Location: [place] CV LAB;  Service: Cardiovascular;  Laterality: N/A;  . EYE SURGERY    . LEFT AND RIGHT HEART CATHETERIZATION WITH CORONARY ANGIOGRAM N/A 10/06/2013   Procedure: LEFT AND RIGHT HEART CATHETERIZATION WITH CORONARY ANGIOGRAM;  Surgeon: Troy Sine, MD;  Location: [facility] CATH LAB;  Service: Cardiovascular;  Laterality: N/A;  . permanent pacemaker  06/2007  . TEE WITHOUT CARDIOVERSION N/A 09/29/2016   Procedure: TRANSESOPHAGEAL ECHOCARDIOGRAM (TEE);  Surgeon: Jerline Pain, MD;  Location: [facility] ENDOSCOPY;  Service: Cardiovascular;  Laterality: N/A;  . TOTAL ABDOMINAL HYSTERECTOMY  1984   2 surgeries endometriosis bso    No current facility-administered medications for this encounter.    Current Outpatient Prescriptions  Medication Sig Dispense Refill Last Dose  . acetaminophen (TYLENOL) 500 MG tablet Take 1,000 mg by mouth every 8 (eight) hours as needed for mild pain.     [name] amiodarone (PACERONE) 200 MG tablet Take 1 tablet (200 mg total) by mouth daily. 30 tablet 11 Taking  . Ascorbic Acid  (VITAMIN C) 1000 MG tablet Take 1,000 mg by mouth daily.   Taking  . atorvastatin (LIPITOR) 40 MG tablet TAKE 1 TABLET BY MOUTH EVERY DAY 90 tablet 2 Taking  . Calcium Carbonate-Vitamin D (CALCIUM 600+D PO) Take 1 tablet by mouth daily.   Taking  . Cyanocobalamin (B-12) 5000 MCG CAPS Take 5,000 mcg by mouth at bedtime.    Taking  . diltiazem (CARDIZEM CD) 120 MG 24 hr capsule Take 1 capsule (120 mg total) by mouth daily. 30 capsule 11 Taking  . ELIQUIS 5 MG TABS tablet TAKE 1 TABLET BY MOUTH TWICE A DAY 60 tablet 11 Taking  . FLUoxetine (PROZAC) 20 MG capsule TAKE ONE CAPSULE BY MOUTH EVERY MORNING 90 capsule 1 Taking  . fluticasone (FLONASE) 50 MCG/ACT nasal spray Place 1 spray into both nostrils daily as needed for allergies or rhinitis (before bedtime).    Taking  . furosemide (LASIX) 80 MG tablet Take 1 tablet (80 mg total) by mouth 2 (two) times daily. (Patient taking differently: Take 80 mg by mouth 2 (two) times daily. Takes 0700 and 1500) 60 tablet 11 Taking  . Ibuprofen-Diphenhydramine HCl (IBUPROFEN PM) 200-25 MG CAPS Take 2 tablets  by mouth at bedtime.     [name] loratadine (CLARITIN) 10 MG tablet Take 1 tablet (10 mg total) by mouth daily. 30 tablet 5 Taking  . Lysine 1000 MG TABS Take 1,000 mg by mouth daily.     [name] Lysine HCl 1000 MG TABS Take 1,000 mg by mouth daily.    Taking  . metoprolol succinate (TOPROL-XL) 50 MG 24 hr tablet Take 1 tablet (50 mg total) by mouth 2 (two) times daily. Take with or immediately following a meal. (Patient taking differently: Take 50 mg by mouth 2 (two) times daily. Take with or immediately following a meal. Verified succinate twice daily) 60 tablet 11 Taking  . Multiple Vitamin (MULTIVITAMIN WITH MINERALS) TABS tablet Take 1 tablet by mouth daily. Centrum   Taking  . nitroGLYCERIN (NITROSTAT) 0.4 MG SL tablet Place 1 tablet (0.4 mg total) under the tongue every 5 (five) minutes x 3 doses as needed for chest pain. 25 tablet 6 Taking  . pantoprazole  (PROTONIX) 40 MG tablet Take 1 tablet (40 mg total) by mouth daily. 30 tablet 2 Taking  . potassium chloride SA (KLOR-CON M20) 20 MEQ tablet Take 1 tablet (20 mEq total) by mouth daily. (Patient taking differently: Take 20 mEq by mouth 2 (two) times daily. ) 180 tablet 3 Taking  . timolol (TIMOPTIC) 0.5 % ophthalmic solution Place 1 drop into the right eye daily.     . vitamin E 1000 UNIT capsule Take 1,000 Units by mouth daily.   Taking  . acetaminophen (TYLENOL) 325 MG tablet Take 2 tablets (650 mg total) by mouth every 4 (four) hours as needed for headache or mild pain. (Patient not taking: Reported on 04/09/2017)   Not Taking at Unknown time  . KLOR-CON M20 20 MEQ tablet TAKE 1 TABLET BY MOUTH TWICE A DAY 180 tablet 3   . PRESCRIPTION MEDICATION Inhale into the lungs at bedtime. CPAP   Taking   Allergies  Allergen Reactions  . Adhesive [Tape] Rash    Allergic to EKG stickers and defibrillation pads.    Social History  Substance Use Topics  . Smoking status: Former Smoker    Packs/day: 1.00    Years: 5.00    Types: Cigarettes    Start date: 05/11/1978    Quit date: 07/03/1982  . Smokeless tobacco: Never Used  . Alcohol use Yes     Comment: once q 6 months-socailly-wine    Family History  Problem Relation Age of Onset  . Suicidality Father        suicide death pt was 3 yrs  . Arrhythmia Mother   . Hypertension Mother   . Diabetes Mother   . Heart attack Brother 78  . Heart disease Paternal Aunt   . Prostate cancer Maternal Grandfather   . Diabetes Paternal Grandfather        fathers side of the family     Review of Systems  Constitutional: Negative.   HENT:       Sinus problems  Eyes: Negative.   Respiratory: Positive for shortness of breath.   Cardiovascular: Positive for leg swelling.       HTN, Hx of heart attack, irregular heart beat  Gastrointestinal: Negative.   Genitourinary: Negative.   Musculoskeletal: Positive for joint pain.  Skin: Negative.   Neurological:  Negative.   Endo/Heme/Allergies: Negative.   Psychiatric/Behavioral: Negative.     Objective:  Physical Exam  Constitutional: She is oriented to person, place, and time. She appears well-developed and well-nourished.  HENT:  Head: Normocephalic and atraumatic.  Eyes: Pupils are equal, round, and reactive to light.  Neck: Normal range of motion. Neck supple.  Cardiovascular: Intact distal pulses.   Respiratory: Effort normal.  Musculoskeletal: She exhibits tenderness.  the patient has an obvious valgus deformity on the left.  She has tenderness over the lateral aspect of the left knee.  Mild medial tenderness.  No noticeable laxity.  No noticeable effusion.  Obvious crepitance with range of motion.  Calves are soft and nontender.  She is neurovascularly intact distally.  She has a range from near 0-115.  Neurological: She is alert and oriented to person, place, and time.  Skin: Skin is warm and dry.  Psychiatric: She has a normal mood and affect. Her behavior is normal. Judgment and thought content normal.    Vital signs in last 24 hours:    Labs:   Estimated body mass index is 38.96 kg/m as calculated from the following:   Height as of 04/13/17: 5' 6.5" (1.689 m).   Weight as of 04/13/17: 111.2 kg (245 lb 1 oz).   Imaging Review Plain radiographs demonstrate  Multiple views of the left knee are reviewed from an outside source.  These show end-stage bone-on-bone arthritis of the lateral compartment left knee.  She does have moderate periarticular osteophyte formation.  Assessment/Plan:  End stage arthritis, left knee   The patient history, physical examination, clinical judgment of the provider and imaging studies are consistent with end stage degenerative joint disease of the left knee(s) and total knee arthroplasty is deemed medically necessary. The treatment options including medical management, injection therapy arthroscopy and arthroplasty were discussed at length. The  risks and benefits of total knee arthroplasty were presented and reviewed. The risks due to aseptic loosening, infection, stiffness, patella tracking problems, thromboembolic complications and other imponderables were discussed. The patient acknowledged the explanation, agreed to proceed with the plan and consent was signed. Patient is being admitted for inpatient treatment for surgery, pain control, PT, OT, prophylactic antibiotics, VTE prophylaxis, progressive ambulation and ADL's and discharge planning. The patient is planning to be discharged home with home health services.

## 2017-04-20 ENCOUNTER — Telehealth: Payer: Self-pay | Admitting: Internal Medicine

## 2017-04-20 MED ORDER — TRANEXAMIC ACID 1000 MG/10ML IV SOLN
2000.0000 mg | INTRAVENOUS | Status: AC
  Administered 2017-04-23: 2000 mg via TOPICAL
  Filled 2017-04-20: qty 20

## 2017-04-20 MED ORDER — BUPIVACAINE LIPOSOME 1.3 % IJ SUSP
20.0000 mL | INTRAMUSCULAR | Status: AC
  Administered 2017-04-23: 20 mL
  Filled 2017-04-20: qty 20

## 2017-04-20 MED ORDER — TRANEXAMIC ACID 1000 MG/10ML IV SOLN
1000.0000 mg | INTRAVENOUS | Status: AC
  Administered 2017-04-23: 1000 mg via INTRAVENOUS
  Filled 2017-04-20: qty 10

## 2017-04-20 NOTE — Telephone Encounter (Addendum)
Spoke with  [name]  and her is surgery is Monday. Please see the staff message I sent to you on correspondence between [facility] and Dr. Manley Mason.   Sent report to primary cardiologist Dr. Manley Mason and he told them that they can see her back after her Total Knee arthoplasty.

## 2017-04-20 NOTE — Telephone Encounter (Signed)
Let her the 2nd radiologist is alos saying that CT is more c/w CHF than pulmonary fibrosis. Th is is not making sense to me bcuse clinically she does not seem to have active CHF  Plan - refer back to cardiology for right heart cath tio ddx ILD v CHF - I will send note to cards - when is knee surgery?   -  IMPRESSION: 1. No findings to suggest interstitial lung disease. 2. The appearance of the chest remains most compatible with congestive heart failure, as discussed above. 3. Aortic atherosclerosis, in addition to 3 vessel coronary artery disease. Assessment for potential risk factor modification, dietary therapy or pharmacologic therapy may be warranted, if clinically indicated. 4. Cirrhosis. 5. Small angiomyolipoma in the left kidney incidentally noted. 6. Additional incidental findings, as above.  Aortic Atherosclerosis (ICD10-I70.0).   Electronically Signed   By: Vinnie Langton M.D.   On: 04/09/2017 17:02

## 2017-04-20 NOTE — Anesthesia Preprocedure Evaluation (Addendum)
Anesthesia Evaluation  Patient identified by MRN, date of birth, ID band Patient awake    Reviewed: Allergy & Precautions, NPO status , Patient's Chart, lab work & pertinent test results, reviewed documented beta blocker date and time   History of Anesthesia Complications Negative for: history of anesthetic complications  Airway Mallampati: II  TM Distance: >3 FB Neck ROM: Full    Dental  (+) Chipped, Dental Advisory Given   Pulmonary sleep apnea and Continuous Positive Airway Pressure Ventilation , former smoker,    breath sounds clear to auscultation       Cardiovascular hypertension, Pt. on medications and Pt. on home beta blockers + CAD, + Past MI and + Cardiac Stents  + dysrhythmias Atrial Fibrillation + pacemaker  Rhythm:Regular Rate:Normal  4/18 ECHO: EF 55-60%, mild MR, right ventricular systolic pressure was increased consistent with moderate pulmonary hypertension.    Neuro/Psych Anxiety Depression TIACVA (vision changes), Residual Symptoms    GI/Hepatic Neg liver ROS, GERD  Controlled,  Endo/Other  Morbid obesity  Renal/GU negative Renal ROS     Musculoskeletal  (+) Arthritis , Osteoarthritis,    Abdominal (+) + obese,   Peds  Hematology eliquis   Anesthesia Other Findings   Reproductive/Obstetrics                           Anesthesia Physical Anesthesia Plan  ASA: III  Anesthesia Plan: Spinal   Post-op Pain Management:  Regional for Post-op pain   Induction:   PONV Risk Score and Plan: 2 and Ondansetron and Dexamethasone  Airway Management Planned: Natural Airway and Simple Face Mask  Additional Equipment:   Intra-op Plan:   Post-operative Plan:   Informed Consent: I have reviewed the patients History and Physical, chart, labs and discussed the procedure including the risks, benefits and alternatives for the proposed anesthesia with the patient or authorized  representative who has indicated his/her understanding and acceptance.   Dental advisory given  Plan Discussed with: CRNA and Surgeon  Anesthesia Plan Comments: (See my anesthesia note. Pulmonologist Dr. Chase Caller feels patient likely has pulmonary fibrosis although not seen on imaging. If she did have ILD, she would be at risk for post-operative flare. Regardless, with known CHF she is also at risk for perioperative CHF flare. He wants to make sure her surgical/anesthesia team discusses this with her. He would strongly prefer that spinal anesthesia be utilized (over general anesthesia) to decrease her risk. Surgeon's office told patient to hold Eliquis 3 days after permission per cardiology. Myra Gianotti, PA-C  Plan routine monitors, SAB with adductor canal block for post op analgesia)       Anesthesia Quick Evaluation

## 2017-04-20 NOTE — Telephone Encounter (Signed)
Hi Will/Baghvat  Could you please consider right heart cath for [name] 09-21-1945 ? She has ILD clinically but radioloigy feels CT c/w CHF . They have said this x 2 over time. So we need to put this issue to rest. Right heart cath would be idea  Thanks  Dr. Brand Males, M.D., [facility].C.P Pulmonary and Critical Care Medicine Staff Physician [facility] Pager: [phone], If no answer or between  15:00h - 7:00h: call [phone]  04/20/2017 11:46 AM

## 2017-04-22 NOTE — Telephone Encounter (Signed)
I agree with the plan for cath She has a hx of CAD so perhaps it would be best to do a right and left heart cath while we are there. Please have her see Robbie Lis, PA to arrange cath. Thanks  [employer]

## 2017-04-23 ENCOUNTER — Encounter (HOSPITAL_COMMUNITY): Payer: Self-pay | Admitting: Orthopedic Surgery

## 2017-04-23 ENCOUNTER — Encounter (HOSPITAL_COMMUNITY)
Admission: RE | Disposition: A | Discharge status: Home Health | Payer: Self-pay | Source: Ambulatory Visit | Attending: Orthopedic Surgery

## 2017-04-23 ENCOUNTER — Inpatient Hospital Stay (HOSPITAL_COMMUNITY): Payer: PPO | Admitting: Certified Registered"

## 2017-04-23 ENCOUNTER — Inpatient Hospital Stay (HOSPITAL_COMMUNITY)
Admission: RE | Admit: 2017-04-23 | Discharge: 2017-04-25 | DRG: 470 | Disposition: A | Discharge status: Home Health | Payer: PPO | Source: Ambulatory Visit | Attending: Orthopedic Surgery | Admitting: Orthopedic Surgery

## 2017-04-23 ENCOUNTER — Inpatient Hospital Stay (HOSPITAL_COMMUNITY): Payer: PPO | Admitting: Vascular Surgery

## 2017-04-23 DIAGNOSIS — Z8673 Personal history of transient ischemic attack (TIA), and cerebral infarction without residual deficits: Secondary | ICD-10-CM | POA: Diagnosis not present

## 2017-04-23 DIAGNOSIS — M19049 Primary osteoarthritis, unspecified hand: Secondary | ICD-10-CM | POA: Diagnosis present

## 2017-04-23 DIAGNOSIS — Z79899 Other long term (current) drug therapy: Secondary | ICD-10-CM

## 2017-04-23 DIAGNOSIS — I130 Hypertensive heart and chronic kidney disease with heart failure and stage 1 through stage 4 chronic kidney disease, or unspecified chronic kidney disease: Secondary | ICD-10-CM | POA: Diagnosis not present

## 2017-04-23 DIAGNOSIS — D62 Acute posthemorrhagic anemia: Secondary | ICD-10-CM | POA: Diagnosis not present

## 2017-04-23 DIAGNOSIS — E1122 Type 2 diabetes mellitus with diabetic chronic kidney disease: Secondary | ICD-10-CM | POA: Diagnosis not present

## 2017-04-23 DIAGNOSIS — I482 Chronic atrial fibrillation: Secondary | ICD-10-CM | POA: Diagnosis not present

## 2017-04-23 DIAGNOSIS — I480 Paroxysmal atrial fibrillation: Secondary | ICD-10-CM | POA: Diagnosis present

## 2017-04-23 DIAGNOSIS — F419 Anxiety disorder, unspecified: Secondary | ICD-10-CM | POA: Diagnosis present

## 2017-04-23 DIAGNOSIS — I272 Pulmonary hypertension, unspecified: Secondary | ICD-10-CM | POA: Diagnosis present

## 2017-04-23 DIAGNOSIS — I5032 Chronic diastolic (congestive) heart failure: Secondary | ICD-10-CM | POA: Diagnosis not present

## 2017-04-23 DIAGNOSIS — Z87891 Personal history of nicotine dependence: Secondary | ICD-10-CM

## 2017-04-23 DIAGNOSIS — M4310 Spondylolisthesis, site unspecified: Secondary | ICD-10-CM | POA: Diagnosis present

## 2017-04-23 DIAGNOSIS — I2510 Atherosclerotic heart disease of native coronary artery without angina pectoris: Secondary | ICD-10-CM | POA: Diagnosis present

## 2017-04-23 DIAGNOSIS — F329 Major depressive disorder, single episode, unspecified: Secondary | ICD-10-CM | POA: Diagnosis present

## 2017-04-23 DIAGNOSIS — G8918 Other acute postprocedural pain: Secondary | ICD-10-CM | POA: Diagnosis not present

## 2017-04-23 DIAGNOSIS — Z7901 Long term (current) use of anticoagulants: Secondary | ICD-10-CM

## 2017-04-23 DIAGNOSIS — Z950 Presence of cardiac pacemaker: Secondary | ICD-10-CM

## 2017-04-23 DIAGNOSIS — M1712 Unilateral primary osteoarthritis, left knee: Secondary | ICD-10-CM | POA: Diagnosis not present

## 2017-04-23 DIAGNOSIS — Z6838 Body mass index (BMI) 38.0-38.9, adult: Secondary | ICD-10-CM

## 2017-04-23 DIAGNOSIS — Z955 Presence of coronary angioplasty implant and graft: Secondary | ICD-10-CM

## 2017-04-23 DIAGNOSIS — Z791 Long term (current) use of non-steroidal anti-inflammatories (NSAID): Secondary | ICD-10-CM

## 2017-04-23 DIAGNOSIS — M479 Spondylosis, unspecified: Secondary | ICD-10-CM | POA: Diagnosis present

## 2017-04-23 DIAGNOSIS — G4733 Obstructive sleep apnea (adult) (pediatric): Secondary | ICD-10-CM | POA: Diagnosis not present

## 2017-04-23 DIAGNOSIS — E785 Hyperlipidemia, unspecified: Secondary | ICD-10-CM | POA: Diagnosis present

## 2017-04-23 DIAGNOSIS — N189 Chronic kidney disease, unspecified: Secondary | ICD-10-CM | POA: Diagnosis present

## 2017-04-23 DIAGNOSIS — I252 Old myocardial infarction: Secondary | ICD-10-CM | POA: Diagnosis not present

## 2017-04-23 DIAGNOSIS — K219 Gastro-esophageal reflux disease without esophagitis: Secondary | ICD-10-CM | POA: Diagnosis present

## 2017-04-23 DIAGNOSIS — Z9989 Dependence on other enabling machines and devices: Secondary | ICD-10-CM

## 2017-04-23 DIAGNOSIS — Z91048 Other nonmedicinal substance allergy status: Secondary | ICD-10-CM

## 2017-04-23 HISTORY — DX: ST elevation (STEMI) myocardial infarction involving other coronary artery of inferior wall: I21.19

## 2017-04-23 HISTORY — DX: Cerebral infarction, unspecified: I63.9

## 2017-04-23 HISTORY — PX: TOTAL KNEE ARTHROPLASTY: SHX125

## 2017-04-23 HISTORY — DX: Spondylolisthesis, site unspecified: M43.10

## 2017-04-23 HISTORY — DX: Unspecified malignant neoplasm of skin, unspecified: C44.90

## 2017-04-23 SURGERY — ARTHROPLASTY, KNEE, TOTAL
Anesthesia: Regional | Site: Knee | Laterality: Left

## 2017-04-23 MED ORDER — ACETAMINOPHEN 325 MG PO TABS
650.0000 mg | ORAL_TABLET | ORAL | Status: DC | PRN
  Administered 2017-04-24 – 2017-04-25 (×3): 650 mg via ORAL
  Filled 2017-04-23 (×3): qty 2

## 2017-04-23 MED ORDER — TIZANIDINE HCL 2 MG PO TABS: 2.0000 mg | ORAL_TABLET | Freq: Four times a day (QID) | ORAL | 0 refills | Status: DC | PRN

## 2017-04-23 MED ORDER — APIXABAN 2.5 MG PO TABS: 2.5000 mg | ORAL_TABLET | Freq: Two times a day (BID) | ORAL | 0 refills | Status: DC

## 2017-04-23 MED ORDER — BUPIVACAINE-EPINEPHRINE (PF) 0.25% -1:200000 IJ SOLN
INTRAMUSCULAR | Status: DC | PRN
  Administered 2017-04-23: 50 mL

## 2017-04-23 MED ORDER — ROPIVACAINE HCL 7.5 MG/ML IJ SOLN
INTRAMUSCULAR | Status: DC | PRN
  Administered 2017-04-23: 20 mL via PERINEURAL

## 2017-04-23 MED ORDER — MIDAZOLAM HCL 2 MG/2ML IJ SOLN
INTRAMUSCULAR | Status: AC
  Administered 2017-04-23: 1 mg via INTRAVENOUS
  Filled 2017-04-23: qty 2

## 2017-04-23 MED ORDER — EPHEDRINE 5 MG/ML INJ
INTRAVENOUS | Status: AC
  Filled 2017-04-23: qty 10

## 2017-04-23 MED ORDER — ONDANSETRON HCL 4 MG/2ML IJ SOLN: 4.0000 mg | Freq: Four times a day (QID) | INTRAMUSCULAR | Status: DC | PRN

## 2017-04-23 MED ORDER — MIDAZOLAM HCL 5 MG/5ML IJ SOLN
INTRAMUSCULAR | Status: DC | PRN
  Administered 2017-04-23 (×2): 1 mg via INTRAVENOUS

## 2017-04-23 MED ORDER — DILTIAZEM HCL ER COATED BEADS 120 MG PO CP24
120.0000 mg | ORAL_CAPSULE | Freq: Every day | ORAL | Status: DC
  Administered 2017-04-24 – 2017-04-25 (×2): 120 mg via ORAL
  Filled 2017-04-23 (×2): qty 1

## 2017-04-23 MED ORDER — LIDOCAINE 2% (20 MG/ML) 5 ML SYRINGE
INTRAMUSCULAR | Status: DC | PRN
  Administered 2017-04-23: 40 mg via INTRAVENOUS

## 2017-04-23 MED ORDER — EPHEDRINE SULFATE-NACL 50-0.9 MG/10ML-% IV SOSY
PREFILLED_SYRINGE | INTRAVENOUS | Status: DC | PRN
  Administered 2017-04-23: 10 mg via INTRAVENOUS
  Administered 2017-04-23 (×2): 5 mg via INTRAVENOUS

## 2017-04-23 MED ORDER — GABAPENTIN 300 MG PO CAPS
300.0000 mg | ORAL_CAPSULE | Freq: Three times a day (TID) | ORAL | Status: DC
  Administered 2017-04-23 – 2017-04-25 (×6): 300 mg via ORAL
  Filled 2017-04-23 (×6): qty 1

## 2017-04-23 MED ORDER — METHOCARBAMOL 500 MG PO TABS
500.0000 mg | ORAL_TABLET | Freq: Four times a day (QID) | ORAL | Status: DC | PRN
  Administered 2017-04-23: 500 mg via ORAL
  Filled 2017-04-23: qty 1

## 2017-04-23 MED ORDER — PHENOL 1.4 % MT LIQD: 1.0000 | OROMUCOSAL | Status: DC | PRN

## 2017-04-23 MED ORDER — SODIUM CHLORIDE 0.9 % IV SOLN
1000.0000 mg | Freq: Once | INTRAVENOUS | Status: AC
  Administered 2017-04-23: 1000 mg via INTRAVENOUS
  Filled 2017-04-23: qty 10

## 2017-04-23 MED ORDER — CEFAZOLIN SODIUM-DEXTROSE 2-4 GM/100ML-% IV SOLN
2.0000 g | INTRAVENOUS | Status: AC
  Administered 2017-04-23: 2 g via INTRAVENOUS
  Filled 2017-04-23: qty 100

## 2017-04-23 MED ORDER — MENTHOL 3 MG MT LOZG: 1.0000 | LOZENGE | OROMUCOSAL | Status: DC | PRN

## 2017-04-23 MED ORDER — SENNOSIDES-DOCUSATE SODIUM 8.6-50 MG PO TABS: 1.0000 | ORAL_TABLET | Freq: Every evening | ORAL | Status: DC | PRN

## 2017-04-23 MED ORDER — PROMETHAZINE HCL 25 MG/ML IJ SOLN: 6.2500 mg | INTRAMUSCULAR | Status: DC | PRN

## 2017-04-23 MED ORDER — FENTANYL CITRATE (PF) 250 MCG/5ML IJ SOLN
INTRAMUSCULAR | Status: AC
  Filled 2017-04-23: qty 5

## 2017-04-23 MED ORDER — CHLORHEXIDINE GLUCONATE 4 % EX LIQD: 60.0000 mL | Freq: Once | CUTANEOUS | Status: DC

## 2017-04-23 MED ORDER — HYDROMORPHONE HCL 1 MG/ML IJ SOLN: 0.5000 mg | INTRAMUSCULAR | Status: DC | PRN

## 2017-04-23 MED ORDER — FENTANYL CITRATE (PF) 100 MCG/2ML IJ SOLN
INTRAMUSCULAR | Status: DC | PRN
  Administered 2017-04-23: 50 ug via INTRAVENOUS

## 2017-04-23 MED ORDER — FENTANYL CITRATE (PF) 100 MCG/2ML IJ SOLN
50.0000 ug | Freq: Once | INTRAMUSCULAR | Status: AC
  Administered 2017-04-23: 50 ug via INTRAVENOUS

## 2017-04-23 MED ORDER — PHENYLEPHRINE HCL 10 MG/ML IJ SOLN
INTRAVENOUS | Status: DC | PRN
  Administered 2017-04-23: 40 ug/min via INTRAVENOUS

## 2017-04-23 MED ORDER — DEXAMETHASONE SODIUM PHOSPHATE 10 MG/ML IJ SOLN
INTRAMUSCULAR | Status: AC
  Filled 2017-04-23: qty 1

## 2017-04-23 MED ORDER — FLEET ENEMA 7-19 GM/118ML RE ENEM: 1.0000 | ENEMA | Freq: Once | RECTAL | Status: DC | PRN

## 2017-04-23 MED ORDER — LORATADINE 10 MG PO TABS
10.0000 mg | ORAL_TABLET | Freq: Every day | ORAL | Status: DC
  Administered 2017-04-23 – 2017-04-25 (×3): 10 mg via ORAL
  Filled 2017-04-23 (×3): qty 1

## 2017-04-23 MED ORDER — METOCLOPRAMIDE HCL 5 MG PO TABS: 5.0000 mg | ORAL_TABLET | Freq: Three times a day (TID) | ORAL | Status: DC | PRN

## 2017-04-23 MED ORDER — METHOCARBAMOL 1000 MG/10ML IJ SOLN
500.0000 mg | Freq: Four times a day (QID) | INTRAMUSCULAR | Status: DC | PRN
Start: 2017-04-23 — End: 2017-04-25
  Filled 2017-04-23: qty 5

## 2017-04-23 MED ORDER — BISACODYL 5 MG PO TBEC
5.0000 mg | DELAYED_RELEASE_TABLET | Freq: Every day | ORAL | Status: DC | PRN
  Administered 2017-04-25: 5 mg via ORAL
  Filled 2017-04-23: qty 1

## 2017-04-23 MED ORDER — ONDANSETRON HCL 4 MG/2ML IJ SOLN
INTRAMUSCULAR | Status: AC
  Filled 2017-04-23: qty 2

## 2017-04-23 MED ORDER — 0.9 % SODIUM CHLORIDE (POUR BTL) OPTIME
TOPICAL | Status: DC | PRN
  Administered 2017-04-23: 1000 mL

## 2017-04-23 MED ORDER — MIDAZOLAM HCL 2 MG/2ML IJ SOLN
INTRAMUSCULAR | Status: AC
  Filled 2017-04-23: qty 2

## 2017-04-23 MED ORDER — OXYCODONE HCL 5 MG PO TABS
10.0000 mg | ORAL_TABLET | ORAL | Status: DC | PRN
  Administered 2017-04-23 (×3): 10 mg via ORAL
  Filled 2017-04-23 (×3): qty 2

## 2017-04-23 MED ORDER — PHENYLEPHRINE 40 MCG/ML (10ML) SYRINGE FOR IV PUSH (FOR BLOOD PRESSURE SUPPORT)
PREFILLED_SYRINGE | INTRAVENOUS | Status: DC | PRN
  Administered 2017-04-23 (×3): 80 ug via INTRAVENOUS

## 2017-04-23 MED ORDER — SODIUM CHLORIDE 0.9 % IJ SOLN
INTRAMUSCULAR | Status: DC | PRN
  Administered 2017-04-23: 50 mL via INTRAVENOUS

## 2017-04-23 MED ORDER — KCL IN DEXTROSE-NACL 20-5-0.45 MEQ/L-%-% IV SOLN
INTRAVENOUS | Status: DC
  Administered 2017-04-23: 17:00:00 via INTRAVENOUS
  Filled 2017-04-23: qty 1000

## 2017-04-23 MED ORDER — FENTANYL CITRATE (PF) 100 MCG/2ML IJ SOLN: 25.0000 ug | INTRAMUSCULAR | Status: DC | PRN

## 2017-04-23 MED ORDER — FUROSEMIDE 40 MG PO TABS
80.0000 mg | ORAL_TABLET | Freq: Two times a day (BID) | ORAL | Status: DC
  Administered 2017-04-23 – 2017-04-25 (×4): 80 mg via ORAL
  Filled 2017-04-23 (×4): qty 2

## 2017-04-23 MED ORDER — PANTOPRAZOLE SODIUM 40 MG PO TBEC
40.0000 mg | DELAYED_RELEASE_TABLET | Freq: Every day | ORAL | Status: DC
  Administered 2017-04-24 – 2017-04-25 (×2): 40 mg via ORAL
  Filled 2017-04-23 (×2): qty 1

## 2017-04-23 MED ORDER — FENTANYL CITRATE (PF) 100 MCG/2ML IJ SOLN
INTRAMUSCULAR | Status: AC
  Administered 2017-04-23: 50 ug via INTRAVENOUS
  Filled 2017-04-23: qty 2

## 2017-04-23 MED ORDER — ALUM & MAG HYDROXIDE-SIMETH 200-200-20 MG/5ML PO SUSP: 30.0000 mL | ORAL | Status: DC | PRN

## 2017-04-23 MED ORDER — FLUOXETINE HCL 20 MG PO CAPS
20.0000 mg | ORAL_CAPSULE | Freq: Every morning | ORAL | Status: DC
  Administered 2017-04-24 – 2017-04-25 (×2): 20 mg via ORAL
  Filled 2017-04-23 (×2): qty 1

## 2017-04-23 MED ORDER — METOPROLOL SUCCINATE ER 50 MG PO TB24
50.0000 mg | ORAL_TABLET | Freq: Two times a day (BID) | ORAL | Status: DC
  Administered 2017-04-23 – 2017-04-25 (×4): 50 mg via ORAL
  Filled 2017-04-23 (×4): qty 1

## 2017-04-23 MED ORDER — LIDOCAINE 2% (20 MG/ML) 5 ML SYRINGE
INTRAMUSCULAR | Status: AC
Start: 2017-04-23 — End: 2017-04-23
  Filled 2017-04-23: qty 5

## 2017-04-23 MED ORDER — PROPOFOL 500 MG/50ML IV EMUL
INTRAVENOUS | Status: DC | PRN
  Administered 2017-04-23: 50 ug/kg/min via INTRAVENOUS

## 2017-04-23 MED ORDER — BUPIVACAINE-EPINEPHRINE (PF) 0.5% -1:200000 IJ SOLN
INTRAMUSCULAR | Status: AC
  Filled 2017-04-23: qty 60

## 2017-04-23 MED ORDER — MIDAZOLAM HCL 2 MG/2ML IJ SOLN
1.0000 mg | Freq: Once | INTRAMUSCULAR | Status: AC
  Administered 2017-04-23: 1 mg via INTRAVENOUS

## 2017-04-23 MED ORDER — DIPHENHYDRAMINE HCL 12.5 MG/5ML PO ELIX: 12.5000 mg | ORAL_SOLUTION | ORAL | Status: DC | PRN

## 2017-04-23 MED ORDER — MEPERIDINE HCL 25 MG/ML IJ SOLN: 6.2500 mg | INTRAMUSCULAR | Status: DC | PRN

## 2017-04-23 MED ORDER — NITROGLYCERIN 0.4 MG SL SUBL: 0.4000 mg | SUBLINGUAL_TABLET | SUBLINGUAL | Status: DC | PRN

## 2017-04-23 MED ORDER — DEXAMETHASONE SODIUM PHOSPHATE 10 MG/ML IJ SOLN
10.0000 mg | Freq: Once | INTRAMUSCULAR | Status: AC
  Administered 2017-04-24: 10 mg via INTRAVENOUS
  Filled 2017-04-23: qty 1

## 2017-04-23 MED ORDER — APIXABAN 2.5 MG PO TABS
2.5000 mg | ORAL_TABLET | Freq: Two times a day (BID) | ORAL | Status: DC
  Administered 2017-04-24 – 2017-04-25 (×3): 2.5 mg via ORAL
  Filled 2017-04-23 (×3): qty 1

## 2017-04-23 MED ORDER — DOCUSATE SODIUM 100 MG PO CAPS
100.0000 mg | ORAL_CAPSULE | Freq: Two times a day (BID) | ORAL | Status: DC
  Administered 2017-04-23 – 2017-04-25 (×4): 100 mg via ORAL
  Filled 2017-04-23 (×4): qty 1

## 2017-04-23 MED ORDER — MIDAZOLAM HCL 2 MG/2ML IJ SOLN: 0.5000 mg | Freq: Once | INTRAMUSCULAR | Status: DC | PRN

## 2017-04-23 MED ORDER — AMIODARONE HCL 100 MG PO TABS
200.0000 mg | ORAL_TABLET | Freq: Every day | ORAL | Status: DC
  Administered 2017-04-24 – 2017-04-25 (×2): 200 mg via ORAL
  Filled 2017-04-23 (×2): qty 2

## 2017-04-23 MED ORDER — FLUTICASONE PROPIONATE 50 MCG/ACT NA SUSP
1.0000 | Freq: Every day | NASAL | Status: DC | PRN
  Filled 2017-04-23: qty 16

## 2017-04-23 MED ORDER — CELECOXIB 200 MG PO CAPS
200.0000 mg | ORAL_CAPSULE | Freq: Two times a day (BID) | ORAL | Status: DC
  Administered 2017-04-23 – 2017-04-25 (×4): 200 mg via ORAL
  Filled 2017-04-23 (×4): qty 1

## 2017-04-23 MED ORDER — TIMOLOL MALEATE 0.5 % OP SOLN: 1.0000 [drp] | Freq: Every day | OPHTHALMIC | Status: DC

## 2017-04-23 MED ORDER — ATORVASTATIN CALCIUM 40 MG PO TABS
40.0000 mg | ORAL_TABLET | Freq: Every day | ORAL | Status: DC
  Administered 2017-04-23 – 2017-04-25 (×3): 40 mg via ORAL
  Filled 2017-04-23 (×3): qty 1

## 2017-04-23 MED ORDER — ONDANSETRON HCL 4 MG PO TABS: 4.0000 mg | ORAL_TABLET | Freq: Four times a day (QID) | ORAL | Status: DC | PRN

## 2017-04-23 MED ORDER — BUPIVACAINE IN DEXTROSE 0.75-8.25 % IT SOLN
INTRATHECAL | Status: DC | PRN
  Administered 2017-04-23: 15 mg via INTRATHECAL

## 2017-04-23 MED ORDER — PROPOFOL 10 MG/ML IV BOLUS
INTRAVENOUS | Status: DC | PRN
  Administered 2017-04-23: 20 mg via INTRAVENOUS

## 2017-04-23 MED ORDER — SODIUM CHLORIDE 0.9 % IR SOLN
Status: DC | PRN
  Administered 2017-04-23: 3000 mL

## 2017-04-23 MED ORDER — LACTATED RINGERS IV SOLN
INTRAVENOUS | Status: DC
  Administered 2017-04-23: 09:00:00 via INTRAVENOUS

## 2017-04-23 MED ORDER — LACTATED RINGERS IV SOLN: INTRAVENOUS | Status: DC

## 2017-04-23 MED ORDER — DEXAMETHASONE SODIUM PHOSPHATE 4 MG/ML IJ SOLN
INTRAMUSCULAR | Status: DC | PRN
  Administered 2017-04-23: 8 mg via INTRAVENOUS

## 2017-04-23 MED ORDER — LACTATED RINGERS IV SOLN
INTRAVENOUS | Status: DC | PRN
  Administered 2017-04-23: 09:00:00 via INTRAVENOUS

## 2017-04-23 MED ORDER — POTASSIUM CHLORIDE CRYS ER 20 MEQ PO TBCR
20.0000 meq | EXTENDED_RELEASE_TABLET | Freq: Two times a day (BID) | ORAL | Status: DC
  Administered 2017-04-23 – 2017-04-25 (×4): 20 meq via ORAL
  Filled 2017-04-23 (×4): qty 1

## 2017-04-23 MED ORDER — ACETAMINOPHEN 650 MG RE SUPP: 650.0000 mg | RECTAL | Status: DC | PRN

## 2017-04-23 MED ORDER — OXYCODONE-ACETAMINOPHEN 5-325 MG PO TABS: 1.0000 | ORAL_TABLET | ORAL | 0 refills | Status: DC | PRN

## 2017-04-23 MED ORDER — ONDANSETRON HCL 4 MG/2ML IJ SOLN
INTRAMUSCULAR | Status: DC | PRN
  Administered 2017-04-23: 4 mg via INTRAVENOUS

## 2017-04-23 MED ORDER — PHENYLEPHRINE 40 MCG/ML (10ML) SYRINGE FOR IV PUSH (FOR BLOOD PRESSURE SUPPORT)
PREFILLED_SYRINGE | INTRAVENOUS | Status: AC
  Filled 2017-04-23: qty 10

## 2017-04-23 MED ORDER — METOCLOPRAMIDE HCL 5 MG/ML IJ SOLN: 5.0000 mg | Freq: Three times a day (TID) | INTRAMUSCULAR | Status: DC | PRN

## 2017-04-23 SURGICAL SUPPLY — 53 items
BANDAGE ESMARK 6X9 LF (GAUZE/BANDAGES/DRESSINGS) ×1 IMPLANT
BLADE SAG 18X100X1.27 (BLADE) ×3 IMPLANT
BLADE SAGITTAL 13X1.27X60 (BLADE) ×1 IMPLANT
BLADE SAGITTAL 13X1.27X60MM (BLADE) ×1
BLADE SAW SGTL 13X75X1.27 (BLADE) IMPLANT
BNDG CMPR 9X6 STRL LF SNTH (GAUZE/BANDAGES/DRESSINGS) ×1
BNDG CMPR MED 10X6 ELC LF (GAUZE/BANDAGES/DRESSINGS) ×1
BNDG ELASTIC 6X10 VLCR STRL LF (GAUZE/BANDAGES/DRESSINGS) ×3 IMPLANT
BNDG ESMARK 6X9 LF (GAUZE/BANDAGES/DRESSINGS) ×3
BOWL SMART MIX CTS (DISPOSABLE) ×3 IMPLANT
CAPT KNEE TOTAL 3 ATTUNE ×2 IMPLANT
CEMENT HV SMART SET (Cement) ×6 IMPLANT
COVER SURGICAL LIGHT HANDLE (MISCELLANEOUS) ×3 IMPLANT
CUFF TOURNIQUET SINGLE 34IN LL (TOURNIQUET CUFF) ×3 IMPLANT
CUFF TOURNIQUET SINGLE 44IN (TOURNIQUET CUFF) IMPLANT
DRAPE EXTREMITY T 121X128X90 (DRAPE) ×3 IMPLANT
DRAPE U-SHAPE 47X51 STRL (DRAPES) ×3 IMPLANT
DRSG AQUACEL AG ADV 3.5X10 (GAUZE/BANDAGES/DRESSINGS) ×3 IMPLANT
DURAPREP 26ML APPLICATOR (WOUND CARE) ×3 IMPLANT
ELECT REM PT RETURN 9FT ADLT (ELECTROSURGICAL) ×3
ELECTRODE REM PT RTRN 9FT ADLT (ELECTROSURGICAL) ×1 IMPLANT
GLOVE BIO SURGEON STRL SZ7.5 (GLOVE) ×3 IMPLANT
GLOVE BIO SURGEON STRL SZ8.5 (GLOVE) ×3 IMPLANT
GLOVE BIOGEL PI IND STRL 8 (GLOVE) ×1 IMPLANT
GLOVE BIOGEL PI IND STRL 9 (GLOVE) ×1 IMPLANT
GLOVE BIOGEL PI INDICATOR 8 (GLOVE) ×2
GLOVE BIOGEL PI INDICATOR 9 (GLOVE) ×2
GOWN STRL REUS W/ TWL LRG LVL3 (GOWN DISPOSABLE) ×1 IMPLANT
GOWN STRL REUS W/ TWL XL LVL3 (GOWN DISPOSABLE) ×2 IMPLANT
GOWN STRL REUS W/TWL LRG LVL3 (GOWN DISPOSABLE) ×3
GOWN STRL REUS W/TWL XL LVL3 (GOWN DISPOSABLE) ×6
HANDPIECE INTERPULSE COAX TIP (DISPOSABLE) ×3
HOOD PEEL AWAY FACE SHEILD DIS (HOOD) ×6 IMPLANT
KIT BASIN OR (CUSTOM PROCEDURE TRAY) ×3 IMPLANT
KIT ROOM TURNOVER OR (KITS) ×3 IMPLANT
MANIFOLD NEPTUNE II (INSTRUMENTS) ×3 IMPLANT
NEEDLE 22X1 1/2 (OR ONLY) (NEEDLE) ×6 IMPLANT
NS IRRIG 1000ML POUR BTL (IV SOLUTION) ×3 IMPLANT
PACK TOTAL JOINT (CUSTOM PROCEDURE TRAY) ×3 IMPLANT
PAD ARMBOARD 7.5X6 YLW CONV (MISCELLANEOUS) ×6 IMPLANT
SET HNDPC FAN SPRY TIP SCT (DISPOSABLE) ×1 IMPLANT
SUT VIC AB 0 CT1 27 (SUTURE) ×3
SUT VIC AB 0 CT1 27XBRD ANBCTR (SUTURE) ×1 IMPLANT
SUT VIC AB 1 CTX 36 (SUTURE) ×3
SUT VIC AB 1 CTX36XBRD ANBCTR (SUTURE) ×1 IMPLANT
SUT VIC AB 2-0 CT1 27 (SUTURE) ×3
SUT VIC AB 2-0 CT1 TAPERPNT 27 (SUTURE) ×1 IMPLANT
SUT VIC AB 3-0 CT1 27 (SUTURE) ×3
SUT VIC AB 3-0 CT1 TAPERPNT 27 (SUTURE) ×1 IMPLANT
SYR CONTROL 10ML LL (SYRINGE) ×6 IMPLANT
TOWEL OR 17X24 6PK STRL BLUE (TOWEL DISPOSABLE) ×3 IMPLANT
TOWEL OR 17X26 10 PK STRL BLUE (TOWEL DISPOSABLE) ×3 IMPLANT
TRAY CATH 16FR W/PLASTIC CATH (SET/KITS/TRAYS/PACK) ×2 IMPLANT

## 2017-04-23 NOTE — Transfer of Care (Signed)
Immediate Anesthesia Transfer of Care Note  Patient: [name]  Procedure(s) Performed: TOTAL KNEE ARTHROPLASTY (Left Knee)  Patient Location: PACU  Anesthesia Type:Regional and Spinal  Level of Consciousness: awake, alert  and patient cooperative  Airway & Oxygen Therapy: Patient Spontanous Breathing  Post-op Assessment: Report given to RN and Post -op Vital signs reviewed and stable  Post vital signs: Reviewed and stable  Last Vitals:  Vitals:   04/23/17 0940 04/23/17 1150  BP:  (!) (P) 99/46  Pulse: (!) 56   Resp: (!) 23   Temp:  (P) 36.6 C  SpO2: 93%     Last Pain:  Vitals:   04/23/17 0808  TempSrc: Oral      Patients Stated Pain Goal: 3 ([id number])  Complications: No apparent anesthesia complications

## 2017-04-23 NOTE — Discharge Instructions (Addendum)
INSTRUCTIONS AFTER JOINT REPLACEMENT  ° °o Remove items at home which could result in a fall. This includes throw rugs or furniture in walking pathways °o ICE to the affected joint every three hours while awake for 30 minutes at a time, for at least the first 3-5 days, and then as needed for pain and swelling.  Continue to use ice for pain and swelling. You may notice swelling that will progress down to the foot and ankle.  This is normal after surgery.  Elevate your leg when you are not up walking on it.   °o Continue to use the breathing machine you got in the hospital (incentive spirometer) which will help keep your temperature down.  It is common for your temperature to cycle up and down following surgery, especially at night when you are not up moving around and exerting yourself.  The breathing machine keeps your lungs expanded and your temperature down. ° ° °DIET:  As you were doing prior to hospitalization, we recommend a well-balanced diet. ° °DRESSING / WOUND CARE / SHOWERING ° °Keep the surgical dressing until follow up.  The dressing is water proof, so you can shower without any extra covering.  IF THE DRESSING FALLS OFF or the wound gets wet inside, change the dressing with sterile gauze.  Please use good hand washing techniques before changing the dressing.  Do not use any lotions or creams on the incision until instructed by your surgeon.   ° °ACTIVITY ° °o Increase activity slowly as tolerated, but follow the weight bearing instructions below.   °o No driving for 6 weeks or until further direction given by your physician.  You cannot drive while taking narcotics.  °o No lifting or carrying greater than 10 lbs. until further directed by your surgeon. °o Avoid periods of inactivity such as sitting longer than an hour when not asleep. This helps prevent blood clots.  °o You may return to work once you are authorized by your doctor.  ° ° ° °WEIGHT BEARING  ° °Weight bearing as tolerated with assist  device (walker, cane, etc) as directed, use it as long as suggested by your surgeon or therapist, typically at least 4-6 weeks. ° ° °EXERCISES ° °Results after joint replacement surgery are often greatly improved when you follow the exercise, range of motion and muscle strengthening exercises prescribed by your doctor. Safety measures are also important to protect the joint from further injury. Any time any of these exercises cause you to have increased pain or swelling, decrease what you are doing until you are comfortable again and then slowly increase them. If you have problems or questions, call your caregiver or physical therapist for advice.  ° °Rehabilitation is important following a joint replacement. After just a few days of immobilization, the muscles of the leg can become weakened and shrink (atrophy).  These exercises are designed to build up the tone and strength of the thigh and leg muscles and to improve motion. Often times heat used for twenty to thirty minutes before working out will loosen up your tissues and help with improving the range of motion but do not use heat for the first two weeks following surgery (sometimes heat can increase post-operative swelling).  ° °These exercises can be done on a training (exercise) mat, on the floor, on a table or on a bed. Use whatever works the best and is most comfortable for you.    Use music or television while you are exercising so that   the exercises are a pleasant break in your day. This will make your life better with the exercises acting as a break in your routine that you can look forward to.   Perform all exercises about fifteen times, three times per day or as directed.  You should exercise both the operative leg and the other leg as well. ° °Exercises include: °  °• Quad Sets - Tighten up the muscle on the front of the thigh (Quad) and hold for 5-10 seconds.   °• Straight Leg Raises - With your knee straight (if you were given a brace, keep it on),  lift the leg to 60 degrees, hold for 3 seconds, and slowly lower the leg.  Perform this exercise against resistance later as your leg gets stronger.  °• Leg Slides: Lying on your back, slowly slide your foot toward your buttocks, bending your knee up off the floor (only go as far as is comfortable). Then slowly slide your foot back down until your leg is flat on the floor again.  °• Angel Wings: Lying on your back spread your legs to the side as far apart as you can without causing discomfort.  °• Hamstring Strength:  Lying on your back, push your heel against the floor with your leg straight by tightening up the muscles of your buttocks.  Repeat, but this time bend your knee to a comfortable angle, and push your heel against the floor.  You may put a pillow under the heel to make it more comfortable if necessary.  ° °A rehabilitation program following joint replacement surgery can speed recovery and prevent re-injury in the future due to weakened muscles. Contact your doctor or a physical therapist for more information on knee rehabilitation.  ° ° °CONSTIPATION ° °Constipation is defined medically as fewer than three stools per week and severe constipation as less than one stool per week.  Even if you have a regular bowel pattern at home, your normal regimen is likely to be disrupted due to multiple reasons following surgery.  Combination of anesthesia, postoperative narcotics, change in appetite and fluid intake all can affect your bowels.  ° °YOU MUST use at least one of the following options; they are listed in order of increasing strength to get the job done.  They are all available over the counter, and you may need to use some, POSSIBLY even all of these options:   ° °Drink plenty of fluids (prune juice may be helpful) and high fiber foods °Colace 100 mg by mouth twice a day  °Senokot for constipation as directed and as needed Dulcolax (bisacodyl), take with full glass of water  °Miralax (polyethylene glycol)  once or twice a day as needed. ° °If you have tried all these things and are unable to have a bowel movement in the first 3-4 days after surgery call either your surgeon or your primary doctor.   ° °If you experience loose stools or diarrhea, hold the medications until you stool forms back up.  If your symptoms do not get better within 1 week or if they get worse, check with your doctor.  If you experience "the worst abdominal pain ever" or develop nausea or vomiting, please contact the office immediately for further recommendations for treatment. ° ° °ITCHING:  If you experience itching with your medications, try taking only a single pain pill, or even half a pain pill at a time.  You can also use Benadryl over the counter for itching or also to   help with sleep.   TED HOSE STOCKINGS:  Use stockings on both legs until for at least 2 weeks or as directed by physician office. They may be removed at night for sleeping.  MEDICATIONS:  See your medication summary on the After Visit Summary that nursing will review with you.  You may have some home medications which will be placed on hold until you complete the course of blood thinner medication.  It is important for you to complete the blood thinner medication as prescribed.  PRECAUTIONS:  If you experience chest pain or shortness of breath - call 911 immediately for transfer to the hospital emergency department.   If you develop a fever greater that 101 F, purulent drainage from wound, increased redness or drainage from wound, foul odor from the wound/dressing, or calf pain - CONTACT YOUR SURGEON.                                                   FOLLOW-UP APPOINTMENTS:  If you do not already have a post-op appointment, please call the office for an appointment to be seen by your surgeon.  Guidelines for how soon to be seen are listed in your After Visit Summary, but are typically between 1-4 weeks after surgery.  OTHER INSTRUCTIONS:   Knee  Replacement:  Do not place pillow under knee, focus on keeping the knee straight while resting. CPM instructions: 0-90 degrees, 2 hours in the morning, 2 hours in the afternoon, and 2 hours in the evening. Place foam block, curve side up under heel at all times except when in CPM or when walking.  DO NOT modify, tear, cut, or change the foam block in any way.  MAKE SURE YOU:   Understand these instructions.   Get help right away if you are not doing well or get worse.    Thank you for letting us be a part of your medical care team.  It is a privilege we respect greatly.  We hope these instructions will help you stay on track for a fast and full recovery!      Information on my medicine - ELIQUIS (apixaban)  This medication education was reviewed with me or my healthcare representative as part of my discharge preparation.  The pharmacist that spoke with me during my hospital stay was:  Saundra Shelling, [facility]  Why was Eliquis prescribed for you? Eliquis was prescribed for you to reduce the risk of blood clots forming after orthopedic surgery.    What do You need to know about Eliquis? Take your Eliquis TWICE DAILY - one tablet in the morning and one tablet in the evening with or without food.  It would be best to take the dose about the same time each day.  If you have difficulty swallowing the tablet whole please discuss with your pharmacist how to take the medication safely.  Take Eliquis exactly as prescribed by your doctor and DO NOT stop taking Eliquis without talking to the doctor who prescribed the medication.  Stopping without other medication to take the place of Eliquis may increase your risk of developing a clot.  After discharge, you should have regular check-up appointments with your healthcare provider that is prescribing your Eliquis.  What do you do if you miss a dose? If a dose of ELIQUIS is not taken at the  scheduled time, take it as soon as possible on the same  day and twice-daily administration should be resumed.  The dose should not be doubled to make up for a missed dose.  Do not take more than one tablet of ELIQUIS at the same time.  Important Safety Information A possible side effect of Eliquis is bleeding. You should call your healthcare provider right away if you experience any of the following: ? Bleeding from an injury or your nose that does not stop. ? Unusual colored urine (red or dark brown) or unusual colored stools (red or black). ? Unusual bruising for unknown reasons. ? A serious fall or if you hit your head (even if there is no bleeding).  Some medicines may interact with Eliquis and might increase your risk of bleeding or clotting while on Eliquis. To help avoid this, consult your healthcare provider or pharmacist prior to using any new prescription or non-prescription medications, including herbals, vitamins, non-steroidal anti-inflammatory drugs (NSAIDs) and supplements.  This website has more information on Eliquis (apixaban): http://www.eliquis.com/eliquis/home

## 2017-04-23 NOTE — Anesthesia Procedure Notes (Addendum)
Procedure Name: MAC Date/Time: 04/23/2017 10:00 AM Performed by: Orlie Dakin Pre-anesthesia Checklist: Patient identified, Emergency Drugs available, Suction available, Patient being monitored and Timeout performed Patient Re-evaluated:Patient Re-evaluated prior to induction Oxygen Delivery Method: Nasal cannula Comments: 1004 O2 via FM now due to H/O OSA, cpap use.  EtCO2 monitoring via sampling line via FM.

## 2017-04-23 NOTE — Anesthesia Postprocedure Evaluation (Signed)
Anesthesia Post Note  Patient: [name]  Procedure(s) Performed: TOTAL KNEE ARTHROPLASTY (Left Knee)     Patient location during evaluation: PACU Anesthesia Type: Regional and Spinal Level of consciousness: awake and alert, patient cooperative and oriented Pain management: pain level controlled Vital Signs Assessment: post-procedure vital signs reviewed and stable Respiratory status: spontaneous breathing, nonlabored ventilation, respiratory function stable and patient connected to nasal cannula oxygen Cardiovascular status: stable and blood pressure returned to baseline Postop Assessment: patient able to bend at knees, no apparent nausea or vomiting and spinal receding Anesthetic complications: no    Last Vitals:  Vitals:   04/23/17 1238 04/23/17 1300  BP: 110/63   Pulse: (!) 58 (!) 57  Resp: 14 17  Temp:  (!) 36.1 C  SpO2: 93% 97%    Last Pain:  Vitals:   04/23/17 0808  TempSrc: Oral                 Aaryanna Hyden,E. Brooklyne Radke

## 2017-04-23 NOTE — Interval H&P Note (Signed)
History and Physical Interval Note:  04/23/2017 9:14 AM  [name]  has presented today for surgery, with the diagnosis of LEFT KNEE OSTEOARTHRITIS  The various methods of treatment have been discussed with the patient and family. After consideration of risks, benefits and other options for treatment, the patient has consented to  Procedure(s): TOTAL KNEE ARTHROPLASTY (Left) as a surgical intervention .  The patient's history has been reviewed, patient examined, no change in status, stable for surgery.  I have reviewed the patient's chart and labs.  Questions were answered to the patient's satisfaction.     Kerin Salen

## 2017-04-23 NOTE — Progress Notes (Signed)
Orthopedic Tech Progress Note Patient Details:  [name] October 26, 1945 [id number]  Ortho Devices Type of Ortho Device: Other (comment) Ortho Device/Splint Location: Provided bone foam zero degree (footsie roll) at bedside.  Pt stated training for Bone foam was already provided.     Kristopher Oppenheim 04/23/2017, 3:20 PM

## 2017-04-23 NOTE — Evaluation (Signed)
Physical Therapy Evaluation Patient Details Name: [name] MRN: [id number] DOB: Feb 01, 1946 Today's Date: 04/23/2017   History of Present Illness  Pt is a 71 y/o female s/p elective L TKA. PMH inlcudes DM, anxiety, depression, a fib, MI, CVA, CHF, CKD, TIA, and s/p pacemaker insertion.   Clinical Impression  Pt is s/p surgery above with deficits below. PTA, pt was using cane for ambulation. Upon eval, pt presenting with post op pain and weakness, and slightly decreased balance. Required min guard assist for mobility. Oxygen sats decreased to low 80s on RA during ambulation and required cues for pursed lip breathing and 2L of oxygen for sats to increase to >90%. Reports husband will be available to assist as needed upon d/c and has all necessary DME. Follow up recommendations per MD arrangements. Will continue to follow acutely to maximize functional mobility independence and safety.     Follow Up Recommendations DC plan and follow up therapy as arranged by surgeon;Supervision for mobility/OOB    Equipment Recommendations  None recommended by PT    Recommendations for Other Services       Precautions / Restrictions Precautions Precautions: Knee;Other (comment) Precaution Booklet Issued: Yes (comment) Precaution Comments: Reviewed supine ther ex with pt. Monitor oxygen sats decreased to low 80s on RA, however, returned to >90% on 2L.  Restrictions Weight Bearing Restrictions: Yes LLE Weight Bearing: Weight bearing as tolerated      Mobility  Bed Mobility Overal bed mobility: Needs Assistance Bed Mobility: Supine to Sit     Supine to sit: Min guard     General bed mobility comments: Min guard for safety   Transfers Overall transfer level: Needs assistance Equipment used: Rolling walker (2 wheeled) Transfers: Sit to/from Stand Sit to Stand: Min guard         General transfer comment: Min guard for steadying. Verbal cues for safe hand placement.    Ambulation/Gait Ambulation/Gait assistance: Min guard Ambulation Distance (Feet): 15 Feet Assistive device: Rolling walker (2 wheeled) Gait Pattern/deviations: Step-to pattern;Decreased step length - right;Decreased step length - left;Decreased weight shift to left;Antalgic Gait velocity: Decreased Gait velocity interpretation: Below normal speed for age/gender General Gait Details: Slow, antalgic gait. Verbal cues for sequencing using RW.   Stairs            Wheelchair Mobility    Modified Rankin (Stroke Patients Only)       Balance Overall balance assessment: Needs assistance Sitting-balance support: No upper extremity supported;Feet supported Sitting balance-Leahy Scale: Good     Standing balance support: Bilateral upper extremity supported;During functional activity Standing balance-Leahy Scale: Poor Standing balance comment: Reliant on UE support for balance                              Pertinent Vitals/Pain Pain Assessment: 0-10 Pain Score: 3  Pain Location: L knee  Pain Descriptors / Indicators: Aching;Operative site guarding Pain Intervention(s): Limited activity within patient's tolerance;Monitored during session;Repositioned    Home Living Family/patient expects to be discharged to:: Private residence Living Arrangements: Spouse/significant other Available Help at Discharge: Family;Available 24 hours/day Type of Home: House Home Access: Ramped entrance     Home Layout: One level Home Equipment: Walker - 2 wheels;Cane - single point;Bedside commode      Prior Function Level of Independence: Independent with assistive device(s)         Comments: Used cane for ambulation     Hand Dominance  Extremity/Trunk Assessment   Upper Extremity Assessment Upper Extremity Assessment: Defer to OT evaluation    Lower Extremity Assessment Lower Extremity Assessment: LLE deficits/detail LLE Deficits / Details: Sensory in tact.  Deficits consistent with post op pain and weakness. Able to perform ther ex below.     Cervical / Trunk Assessment Cervical / Trunk Assessment: Normal  Communication   Communication: No difficulties  Cognition Arousal/Alertness: Awake/alert Behavior During Therapy: WFL for tasks assessed/performed Overall Cognitive Status: Within Functional Limits for tasks assessed                                        General Comments General comments (skin integrity, edema, etc.): Pt's husband present during session     Exercises Total Joint Exercises Ankle Circles/Pumps: AROM;Both;20 reps Quad Sets: AROM;Left;10 reps Towel Squeeze: AROM;Both;10 reps Heel Slides:  (verbally reviewed ) Hip ABduction/ADduction: AROM;Left;10 reps   Assessment/Plan    PT Assessment Patient needs continued PT services  PT Problem List Decreased strength;Decreased range of motion;Decreased balance;Decreased mobility;Decreased knowledge of use of DME;Decreased knowledge of precautions;Pain;Cardiopulmonary status limiting activity       PT Treatment Interventions DME instruction;Gait training;Functional mobility training;Therapeutic activities;Therapeutic exercise;Balance training;Neuromuscular re-education;Patient/family education    PT Goals (Current goals can be found in the Care Plan section)  Acute Rehab PT Goals Patient Stated Goal: to go home  PT Goal Formulation: With patient Time For Goal Achievement: 04/30/17 Potential to Achieve Goals: Good    Frequency 7X/week   Barriers to discharge        Co-evaluation               AM-PAC PT "6 Clicks" Daily Activity  Outcome Measure Difficulty turning over in bed (including adjusting bedclothes, sheets and blankets)?: A Little Difficulty moving from lying on back to sitting on the side of the bed? : A Little Difficulty sitting down on and standing up from a chair with arms (e.g., wheelchair, bedside commode, etc,.)?: Unable Help  needed moving to and from a bed to chair (including a wheelchair)?: A Little Help needed walking in hospital room?: A Little Help needed climbing 3-5 steps with a railing? : A Lot 6 Click Score: 15    End of Session Equipment Utilized During Treatment: Gait belt;Left knee immobilizer Activity Tolerance: Patient tolerated treatment well Patient left: in chair;with call bell/phone within reach;with family/visitor present Nurse Communication: Mobility status PT Visit Diagnosis: Other abnormalities of gait and mobility (R26.89);Pain Pain - Right/Left: Left Pain - part of body: Knee    Time: 6967-8938 PT Time Calculation (min) (ACUTE ONLY): 26 min   Charges:   PT Evaluation $PT Eval Low Complexity: 1 Low PT Treatments $Gait Training: 8-22 mins   PT G Codes:        Leighton Ruff, PT, DPT  Acute Rehabilitation Services  Pager: [phone]   Rudean Hitt 04/23/2017, 5:40 PM

## 2017-04-23 NOTE — Progress Notes (Signed)
Pt wearing home CPAP machine.  No issues noted at time of check.

## 2017-04-23 NOTE — Anesthesia Procedure Notes (Deleted)
Procedure Name: MAC Date/Time: 04/23/2017 10:04 AM Performed by: Orlie Dakin Pre-anesthesia Checklist: Patient identified, Emergency Drugs available, Suction available, Patient being monitored and Timeout performed Oxygen Delivery Method: Simple face mask

## 2017-04-23 NOTE — Anesthesia Procedure Notes (Signed)
Spinal  Patient location during procedure: OR End time: 04/23/2017 10:01 AM Staffing Anesthesiologist: Annye Asa Performed: anesthesiologist  Preanesthetic Checklist Completed: patient identified, site marked, surgical consent, pre-op evaluation, timeout performed, IV checked, risks and benefits discussed and monitors and equipment checked Spinal Block Patient position: sitting Prep: ChloraPrep and site prepped and draped Patient monitoring: blood pressure, continuous pulse ox, heart rate and cardiac monitor Approach: midline Location: L3-4 Injection technique: single-shot Needle Needle type: Quincke  Needle gauge: 25 G Needle length: 9 cm Additional Notes Pt identified in Operating room.  Monitors applied. Working IV access confirmed. Sterile prep, drape lumbar spine.  1% lido local L 3,4.  #25ga Quincke into clear CSF L 3,4.  15mg  0.75% Bupivacaine with dextrose injected with asp CSF beginning and end of injection.  Patient asymptomatic, VSS, no heme aspirated, tolerated well.  Jenita Seashore, MD

## 2017-04-23 NOTE — Op Note (Signed)
PATIENT ID:      [name]  MRN:     [id number] DOB/AGE:    71/22/1947 / 71 y.o.       OPERATIVE REPORT    DATE OF PROCEDURE:  04/23/2017       PREOPERATIVE DIAGNOSIS:   LEFT KNEE OSTEOARTHRITIS      Estimated body mass index is 38.97 kg/m as calculated from the following:   Height as of this encounter: 5' 6.5" (1.689 m).   Weight as of this encounter: 111.2 kg (245 lb 1 oz).                                                        POSTOPERATIVE DIAGNOSIS:   LEFT KNEE OSTEOARTHRITIS                                                                      PROCEDURE:  Procedure(s): TOTAL KNEE ARTHROPLASTY Using DepuyAttune RP implants #5NL Femur, #5Tibia, 5 mm Attune RP bearing, 35 Patella     SURGEON: Issabela Lesko J    ASSISTANT:   [employer]   (Present and scrubbed throughout the case, critical for assistance with exposure, retraction, instrumentation, and closure.)         ANESTHESIA: Spinal, 20cc Exparel, 50cc 0.25% Marcaine  EBL: 300cc  FLUID REPLACEMENT: 1600 crystalloid  TOURNIQUET TIME: 0  Drains: None  Tranexamic Acid: 1gm IV, 2gm topical  COMPLICATIONS:  None         INDICATIONS FOR PROCEDURE: The patient has  LEFT KNEE OSTEOARTHRITIS, Val deformities, XR shows bone on bone arthritis, lateral subluxation of tibia. Patient has failed all conservative measures including anti-inflammatory medicines, narcotics, attempts at  exercise and weight loss, cortisone injections and viscosupplementation.  Risks and benefits of surgery have been discussed, questions answered.   DESCRIPTION OF PROCEDURE: The patient identified by armband, received  IV antibiotics, in the holding area at [facility]. Patient taken to the operating room, appropriate anesthetic  monitors were attached, and Spinal anesthesia was  induced. Tourniquet  applied high to the operative thigh. Lateral post and foot positioner  applied to the table, the lower extremity was then prepped and draped  in  usual sterile fashion from the toes to the tourniquet. Time-out procedure was performed. We began the operation, with the knee flexed 120 degrees, by making the anterior midline incision starting at handbreadth above the patella going over the patella 1 cm medial to and 4 cm distal to the tibial tubercle. Small bleeders in the skin and the  subcutaneous tissue identified and cauterized. Transverse retinaculum was incised and reflected medially and a medial parapatellar arthrotomy was accomplished. the patella was everted and theprepatellar fat pad resected. The superficial medial collateral  ligament was then elevated from anterior to posterior along the proximal  flare of the tibia and anterior half of the menisci resected. The knee was hyperflexed exposing bone on bone arthritis. Peripheral and notch osteophytes as well as the cruciate ligaments were then resected. We continued to  work our way around posteriorly along the  proximal tibia, and externally  rotated the tibia subluxing it out from underneath the femur. A McHale  retractor was placed through the notch and a lateral Hohmann retractor  placed, and we then drilled through the proximal tibia in line with the  axis of the tibia followed by an intramedullary guide rod and 2-degree  posterior slope cutting guide. The tibial cutting guide, 3 degree posterior sloped, was pinned into place allowing resection of 8 mm of bone medially and 0 mm of bone laterally. Satisfied with the tibial resection, we then  entered the distal femur 2 mm anterior to the PCL origin with the  intramedullary guide rod and applied the distal femoral cutting guide  set at 9 mm, with 5 degrees of valgus. This was pinned along the  epicondylar axis. At this point, the distal femoral cut was accomplished without difficulty. We then sized for a #5NL femoral component and pinned the guide in 0 degrees of external rotation. The chamfer cutting guide was pinned into place. The  anterior, posterior, and chamfer cuts were accomplished without difficulty followed by  the Attune RP box cutting guide and the box cut. We also removed posterior osteophytes from the posterior femoral condyles. At this  time, the knee was brought into full extension. We checked our  extension and flexion gaps and found them symmetric for a 5 mm bearing. Distracting in extension with a lamina spreader, the posterior horns of the menisci were removed, and Exparel, diluted to 60 cc, with 20cc NS, and 20cc 0.5% Marcaine,was injected into the capsule and synovium of the knee. The posterior patella cut was accomplished with the 9.5 mm Attune cutting guide, sized for a 31mm dome, and the fixation pegs drilled.The knee  was then once again hyperflexed exposing the proximal tibia. We sized for a # 5 tibial base plate, applied the smokestack and the conical reamer followed by the the Delta fin keel punch. We then hammered into place the Attune RP trial femoral component, drilled the lugs, inserted a  5 mm trial bearing, trial patellar button, and took the knee through range of motion from 0-130 degrees. No thumb pressure was required for patellar Tracking. At this point, the limb was wrapped with an Esmarch bandage and the tourniquet inflated to 350 mmHg. All trial components were removed, mating surfaces irrigated with pulse lavage, and dried with suction and sponges. 10 cc of the Exparel solution was applied to the cancellus bone of the patella distal femur and proximal tibia.  After waiting 1 minute, the bony surfaces were again, dried with sponges. A double batch of DePuy HV cement with 1500 mg of Zinacef was mixed and applied to all bony metallic mating surfaces except for the posterior condyles of the femur itself. In order, we hammered into place the tibial tray and removed excess cement, the femoral component and removed excess cement. The final Attune RP bearing  was inserted, and the knee brought to full  extension with compression.  The patellar button was clamped into place, and excess cement  removed. While the cement cured the wound was irrigated out with normal saline solution pulse lavage. Ligament stability and patellar tracking were checked and found to be excellent. The parapatellar arthrotomy was closed with  running #1 Vicryl suture. The subcutaneous tissue with 0 and 2-0 undyed  Vicryl suture, and the skin with running 3-0 SQ vicryl. A dressing of Xeroform,  4 x 4, dressing sponges, Webril, and Ace wrap applied. The patient  awakened,  and taken to recovery room without difficulty.   Jag Lenz J 04/23/2017, 11:30 AM

## 2017-04-23 NOTE — Progress Notes (Signed)
Pt has home cpap for tonight.

## 2017-04-23 NOTE — Anesthesia Procedure Notes (Signed)
Anesthesia Regional Block: Adductor canal block   Pre-Anesthetic Checklist: ,, timeout performed, Correct Patient, Correct Site, Correct Laterality, Correct Procedure, Correct Position, site marked, Risks and benefits discussed,  Surgical consent,  Pre-op evaluation,  At surgeon's request and post-op pain management  Laterality: Left  Prep: chloraprep       Needles:   Needle Type: Echogenic Needle     Needle Length: 9cm  Needle Gauge: 21     Additional Needles:   Procedures:,,,, ultrasound used (permanent image in chart),,,,  Narrative:  Start time: 04/23/2017 9:15 AM End time: 04/23/2017 9:22 AM Injection made incrementally with aspirations every 5 mL.  Performed by: Personally  Anesthesiologist: Glennon Mac, Sanvika Cuttino  Additional Notes: Pt identified in Holding room.  Monitors applied. Working IV access confirmed. Sterile prep.  #21ga ECHOgenic needle into adductor canal with US guidance.  20cc 0.75% Ropivacaine injected incrementally after negative test dose.  Patient asymptomatic, VSS, no heme aspirated, tolerated well.  Jenita Seashore, MD

## 2017-04-24 ENCOUNTER — Encounter (HOSPITAL_COMMUNITY): Payer: Self-pay | Admitting: Orthopedic Surgery

## 2017-04-24 LAB — CBC
HCT: 38.2 % (ref 36.0–46.0)
Hemoglobin: 11.8 g/dL — ABNORMAL LOW (ref 12.0–15.0)
MCH: 30.2 pg (ref 26.0–34.0)
MCHC: 30.9 g/dL (ref 30.0–36.0)
MCV: 97.7 fL (ref 78.0–100.0)
Platelets: 236 10*3/uL (ref 150–400)
RBC: 3.91 MIL/uL (ref 3.87–5.11)
RDW: 14.3 % (ref 11.5–15.5)
WBC: 13.8 10*3/uL — ABNORMAL HIGH (ref 4.0–10.5)

## 2017-04-24 LAB — BASIC METABOLIC PANEL
Anion gap: 8 (ref 5–15)
BUN: 18 mg/dL (ref 6–20)
CO2: 26 mmol/L (ref 22–32)
Calcium: 8.9 mg/dL (ref 8.9–10.3)
Chloride: 100 mmol/L — ABNORMAL LOW (ref 101–111)
Creatinine, Ser: 1.22 mg/dL — ABNORMAL HIGH (ref 0.44–1.00)
GFR calc Af Amer: 50 mL/min — ABNORMAL LOW (ref >60)
GFR calc non Af Amer: 43 mL/min — ABNORMAL LOW (ref >60)
Glucose, Bld: 161 mg/dL — ABNORMAL HIGH (ref 65–99)
Potassium: 4.8 mmol/L (ref 3.5–5.1)
Sodium: 134 mmol/L — ABNORMAL LOW (ref 135–145)

## 2017-04-24 NOTE — Progress Notes (Signed)
SATURATION QUALIFICATIONS: (This note is used to comply with regulatory documentation for home oxygen)  Patient Saturations on Room Air at Rest = 90%  Patient Saturations on Room Air while Ambulating = 85%  Patient Saturations on 2 Liters of oxygen while Ambulating = 92%  Please briefly explain why patient needs home oxygen:Pt requires supplemental oxygen to maintain saturations with mobility >89% [name], [place]

## 2017-04-24 NOTE — Progress Notes (Signed)
PATIENT ID: [name]  MRN: [id number]  DOB/AGE:  [birth year] / 71 y.o.  1 Day Post-Op Procedure(s) (LRB): TOTAL KNEE ARTHROPLASTY (Left)    PROGRESS NOTE Subjective: Patient is alert, oriented, no Nausea, no Vomiting, yes passing gas. Taking PO well. Denies SOB, Chest or Calf Pain. Using Incentive Spirometer, PAS in place. Ambulate WBAT, Patient reports pain as 2/10 .    Objective: Vital signs in last 24 hours: Vitals:   04/23/17 1415 04/23/17 1543 04/23/17 2010 04/24/17 0630  BP:  (!) 130/53 116/63 124/61  Pulse: (!) 58 64 61 60  Resp: 20 18 18 18   Temp:  98.3 F (36.8 C) 98.3 F (36.8 C) 98.2 F (36.8 C)  TempSrc:  Oral Oral Oral  SpO2: 90% 95% 96% 96%  Weight:      Height:          Intake/Output from previous day: I/O last 3 completed shifts: In: 1089.6 [P.O.:240; I.V.:739.6; IV Piggyback:110] Out: 1250 [Urine:950; Blood:300]   Intake/Output this shift: No intake/output data recorded.   LABORATORY DATA: No results for input(s): WBC, HGB, HCT, PLT, NA, K, CL, CO2, BUN, CREATININE, GLUCOSE, GLUCAP, INR, CALCIUM in the last 72 hours.  Invalid input(s): PT, 2  Examination: Neurologically intact ABD soft Neurovascular intact Sensation intact distally Intact pulses distally Dorsiflexion/Plantar flexion intact Incision: dressing C/D/I No cellulitis present Compartment soft} Can do SLR  Assessment:   1 Day Post-Op Procedure(s) (LRB): TOTAL KNEE ARTHROPLASTY (Left) ADDITIONAL DIAGNOSIS: Expected Acute Blood Loss Anemia, obesity, CAD, Pulm HTN, sleep apnea  Plan: PT/OT WBAT, AROM and PROM  DVT Prophylaxis:  SCDx72hrs, ASA 325 mg BID x 2 weeks DISCHARGE PLAN: Home, tomorrow DISCHARGE NEEDS: HHPT, Walker and 3-in-1 comode seat     Tobey Lippard J 04/24/2017, 7:30 AM Patient ID: [name], female   DOB: August 29, 1945, 71 y.o.   MRN: [id number]

## 2017-04-24 NOTE — Evaluation (Signed)
Occupational Therapy Evaluation Patient Details Name: [name] MRN: [id number] DOB: [birth year] Today's Date: 04/24/2017    History of Present Illness Pt is a 71 y/o female s/p elective L TKA. PMH inlcudes DM, anxiety, depression, a fib, MI, CVA, CHF, CKD, TIA, and s/p pacemaker insertion.    Clinical Impression   This 71 y/o F presents with the above. At baseline Pt is independent with ADLs and mod independent with funcitonal mobility. Pt currently requires MinGuard assist for functional mobility at RW level, ModA for LB ADLs secondary to L LE functional deficits. Pt on 2L O2 during session completion and sats remaining between 88-94% with brief functional mobility within room. Will continue to follow acutely to progress Pt's safety and independence with ADLs and mobility.     Follow Up Recommendations  DC plan and follow up therapy as arranged by surgeon;Supervision/Assistance - 24 hour    Equipment Recommendations  None recommended by OT           Precautions / Restrictions Precautions Precautions: Knee Precaution Comments: watch SpO2 Restrictions Weight Bearing Restrictions: Yes LLE Weight Bearing: Weight bearing as tolerated      Mobility Bed Mobility Overal bed mobility: Needs Assistance Bed Mobility: Supine to Sit;Sit to Supine     Supine to sit: Min guard Sit to supine: Min guard   General bed mobility comments: Min guard for safety   Transfers Overall transfer level: Needs assistance Equipment used: Rolling walker (2 wheeled) Transfers: Sit to/from Stand Sit to Stand: Supervision         General transfer comment: cues for hand placement    Balance Overall balance assessment: Needs assistance Sitting-balance support: No upper extremity supported;Feet supported Sitting balance-Leahy Scale: Good     Standing balance support: Bilateral upper extremity supported;During functional activity Standing balance-Leahy Scale: Fair                              ADL either performed or assessed with clinical judgement   ADL Overall ADL's : Needs assistance/impaired Eating/Feeding: Independent;Sitting   Grooming: Wash/dry hands;Min guard;Standing   Upper Body Bathing: Min guard;Sitting   Lower Body Bathing: Minimal assistance;Sit to/from stand   Upper Body Dressing : Min guard;Sitting   Lower Body Dressing: Moderate assistance;Sit to/from stand   Toilet Transfer: Minimal assistance;Ambulation;Regular Toilet;Grab bars;RW   Toileting- Clothing Manipulation and Hygiene: Minimal assistance;Sit to/from stand       Functional mobility during ADLs: Min guard;Rolling walker General ADL Comments: Pt reports she has AE at home, will plan to review use of AE for LB ADLs in next session as well as shower transfer; Pt on 2L O2 during session and sats remaining between 88-94%                         Pertinent Vitals/Pain Pain Assessment: Faces Faces Pain Scale: Hurts a little bit Pain Location: L knee  Pain Descriptors / Indicators: Sore Pain Intervention(s): Limited activity within patient's tolerance;Monitored during session;Ice applied          Extremity/Trunk Assessment Upper Extremity Assessment Upper Extremity Assessment: Overall WFL for tasks assessed   Lower Extremity Assessment Lower Extremity Assessment: Defer to PT evaluation   Cervical / Trunk Assessment Cervical / Trunk Assessment: Normal   Communication Communication Communication: No difficulties   Cognition Arousal/Alertness: Awake/alert Behavior During Therapy: WFL for tasks assessed/performed Overall Cognitive Status: Within Functional Limits for tasks assessed  Home Living Family/patient expects to be discharged to:: Private residence Living Arrangements: Spouse/significant other Available Help at Discharge: Family;Available 24 hours/day Type of Home: House Home  Access: Ramped entrance     Home Layout: One level     Bathroom Shower/Tub: Occupational psychologist: Handicapped height     Home Equipment: Environmental consultant - 2 wheels;Cane - single point;Bedside commode          Prior Functioning/Environment Level of Independence: Independent with assistive device(s)        Comments: Used cane for ambulation        OT Problem List: Decreased strength;Impaired balance (sitting and/or standing);Decreased knowledge of use of DME or AE;Cardiopulmonary status limiting activity      OT Treatment/Interventions: Self-care/ADL training;Therapeutic activities;Balance training;DME and/or AE instruction;Therapeutic exercise;Energy conservation;Patient/family education    OT Goals(Current goals can be found in the care plan section) Acute Rehab OT Goals Patient Stated Goal: to go home  OT Goal Formulation: With patient Time For Goal Achievement: 05/08/17 Potential to Achieve Goals: Good  OT Frequency: Min 2X/week                             AM-PAC PT "6 Clicks" Daily Activity     Outcome Measure Help from another person eating meals?: None Help from another person taking care of personal grooming?: A Little Help from another person toileting, which includes using toliet, bedpan, or urinal?: A Little Help from another person bathing (including washing, rinsing, drying)?: A Lot Help from another person to put on and taking off regular upper body clothing?: None Help from another person to put on and taking off regular lower body clothing?: A Lot 6 Click Score: 18   End of Session Equipment Utilized During Treatment: Gait belt;Rolling walker;Oxygen Nurse Communication: Mobility status  Activity Tolerance: Patient tolerated treatment well Patient left: in bed;with call bell/phone within reach  OT Visit Diagnosis: Other abnormalities of gait and mobility (R26.89)                Time: 1536-1610 OT Time Calculation (min): 34  min Charges:  OT General Charges $OT Visit: 1 Visit OT Evaluation $OT Eval Low Complexity: 1 Low OT Treatments $Self Care/Home Management : 8-22 mins G-Codes:     [name], OT Pager [phone] 04/24/2017   [name] 04/24/2017, 5:05 PM

## 2017-04-24 NOTE — Progress Notes (Signed)
Physical Therapy Treatment Patient Details Name: [name] MRN: [id number] DOB: Jul 23, 1945 Today's Date: 04/24/2017    History of Present Illness Pt is a 71 y/o female s/p elective L TKA. PMH inlcudes DM, anxiety, depression, a fib, MI, CVA, CHF, CKD, TIA, and s/p pacemaker insertion.     PT Comments    Pt remains pleasant and moving well although walked slightly less than this morning. Pt continues to desaturate on RA and as far as 85% on RA with gait today and required 2L for mobility. Pt educated for HEP and goal to increase ROM. Will continue to follow.    Follow Up Recommendations  DC plan and follow up therapy as arranged by surgeon;Home health PT     Equipment Recommendations  None recommended by PT    Recommendations for Other Services       Precautions / Restrictions Precautions Precautions: Knee Precaution Comments: watch SpO2 Restrictions LLE Weight Bearing: Weight bearing as tolerated    Mobility  Bed Mobility Overal bed mobility: Modified Independent                Transfers Overall transfer level: Needs assistance   Transfers: Sit to/from Stand Sit to Stand: Supervision         General transfer comment: cues for hand placement  Ambulation/Gait Ambulation/Gait assistance: Supervision Ambulation Distance (Feet): 350 Feet Assistive device: Rolling walker (2 wheeled) Gait Pattern/deviations: Step-through pattern;Decreased stride length   Gait velocity interpretation: Below normal speed for age/gender General Gait Details: cues for breathing technique and shoulder depression   Stairs            Wheelchair Mobility    Modified Rankin (Stroke Patients Only)       Balance Overall balance assessment: No apparent balance deficits (not formally assessed)                                          Cognition Arousal/Alertness: Awake/alert Behavior During Therapy: WFL for tasks assessed/performed Overall Cognitive  Status: Within Functional Limits for tasks assessed                                        Exercises Total Joint Exercises Heel Slides: AROM;Left;Supine;15 reps Hip ABduction/ADduction: AROM;Left;Supine;15 reps Straight Leg Raises: AROM;Left;Supine;10 reps Long Arc Quad: AROM;Left;Seated;15 reps Goniometric ROM: 0-80 Marching in Standing: AROM;Left;Seated;15 reps    General Comments        Pertinent Vitals/Pain Pain Assessment: No/denies pain    Home Living                      Prior Function            PT Goals (current goals can now be found in the care plan section) Progress towards PT goals: Progressing toward goals    Frequency           PT Plan Current plan remains appropriate    Co-evaluation              AM-PAC PT "6 Clicks" Daily Activity  Outcome Measure  Difficulty turning over in bed (including adjusting bedclothes, sheets and blankets)?: None Difficulty moving from lying on back to sitting on the side of the bed? : A Little Difficulty sitting down on and standing up from a chair  with arms (e.g., wheelchair, bedside commode, etc,.)?: A Little Help needed moving to and from a bed to chair (including a wheelchair)?: A Little Help needed walking in hospital room?: A Little Help needed climbing 3-5 steps with a railing? : A Little 6 Click Score: 19    End of Session   Activity Tolerance: Patient tolerated treatment well Patient left: in chair;with call bell/phone within reach;with family/visitor present Nurse Communication: Mobility status PT Visit Diagnosis: Difficulty in walking, not elsewhere classified (R26.2);Muscle weakness (generalized) (M62.81)     Time: 2548-3234 PT Time Calculation (min) (ACUTE ONLY): 23 min  Charges:  $Gait Training: 8-22 mins $Therapeutic Exercise: 8-22 mins                    G Codes:       Elwyn Reach, PT [phone]    [place] 04/24/2017, 1:33 PM

## 2017-04-24 NOTE — Progress Notes (Signed)
Physical Therapy Treatment Patient Details Name: [name] MRN: [id number] DOB: 02-20-1946 Today's Date: 04/24/2017    History of Present Illness Pt is a 71 y/o female s/p elective L TKA. PMH inlcudes DM, anxiety, depression, a fib, MI, CVA, CHF, CKD, TIA, and s/p pacemaker insertion.     PT Comments    Pt very pleasant and moving exceptionally well. Pt with SpO2 89-92% on RA throughout session with incentive spirometer provided and educated end of session. Pt educated for HEP, progression and will continue to follow to maximize independence.    Follow Up Recommendations  DC plan and follow up therapy as arranged by surgeon;Home health PT     Equipment Recommendations  None recommended by PT    Recommendations for Other Services       Precautions / Restrictions Precautions Precautions: Knee Precaution Comments: watch SpO2 Restrictions LLE Weight Bearing: Weight bearing as tolerated    Mobility  Bed Mobility Overal bed mobility: Modified Independent                Transfers Overall transfer level: Needs assistance   Transfers: Sit to/from Stand Sit to Stand: Supervision         General transfer comment: cues for hand placement  Ambulation/Gait Ambulation/Gait assistance: Supervision Ambulation Distance (Feet): 500 Feet Assistive device: Rolling walker (2 wheeled) Gait Pattern/deviations: Step-through pattern;Decreased stride length   Gait velocity interpretation: Below normal speed for age/gender General Gait Details: cues for step through pattern and heel strike on LLE   Stairs            Wheelchair Mobility    Modified Rankin (Stroke Patients Only)       Balance Overall balance assessment: No apparent balance deficits (not formally assessed)                                          Cognition Arousal/Alertness: Awake/alert Behavior During Therapy: WFL for tasks assessed/performed Overall Cognitive Status: Within  Functional Limits for tasks assessed                                        Exercises Total Joint Exercises Heel Slides: AROM;Left;Supine;15 reps Hip ABduction/ADduction: AROM;Left;Supine;15 reps Straight Leg Raises: AROM;Left;Supine;10 reps Long Arc Quad: AROM;Left;Seated;15 reps Marching in Standing: AROM;Left;Seated;15 reps    General Comments        Pertinent Vitals/Pain Pain Score: 2  Pain Location: L knee  Pain Descriptors / Indicators: Aching Pain Intervention(s): Limited activity within patient's tolerance;Repositioned;Ice applied;Monitored during session    Home Living                      Prior Function            PT Goals (current goals can now be found in the care plan section) Progress towards PT goals: Progressing toward goals    Frequency    7X/week      PT Plan Current plan remains appropriate    Co-evaluation              AM-PAC PT "6 Clicks" Daily Activity  Outcome Measure  Difficulty turning over in bed (including adjusting bedclothes, sheets and blankets)?: None Difficulty moving from lying on back to sitting on the side of the bed? : None Difficulty sitting  down on and standing up from a chair with arms (e.g., wheelchair, bedside commode, etc,.)?: A Little Help needed moving to and from a bed to chair (including a wheelchair)?: A Little Help needed walking in hospital room?: A Little Help needed climbing 3-5 steps with a railing? : A Little 6 Click Score: 20    End of Session Equipment Utilized During Treatment: Gait belt Activity Tolerance: Patient tolerated treatment well Patient left: in chair;with call bell/phone within reach;with family/visitor present Nurse Communication: Mobility status PT Visit Diagnosis: Difficulty in walking, not elsewhere classified (R26.2);Muscle weakness (generalized) (M62.81)     Time: 3817-7116 PT Time Calculation (min) (ACUTE ONLY): 30 min  Charges:  $Gait Training:  8-22 mins $Therapeutic Exercise: 8-22 mins                    G Codes:      Elwyn Reach, PT [phone]    [place] 04/24/2017, 9:20 AM

## 2017-04-24 NOTE — Progress Notes (Signed)
Patient has home CPAP set up at bedside and will place herself on when ready. RT will continue to monitor.

## 2017-04-25 ENCOUNTER — Telehealth: Payer: Self-pay | Admitting: *Deleted

## 2017-04-25 LAB — BASIC METABOLIC PANEL
Anion gap: 14 (ref 5–15)
BUN: 20 mg/dL (ref 6–20)
CO2: 23 mmol/L (ref 22–32)
Calcium: 8.9 mg/dL (ref 8.9–10.3)
Chloride: 99 mmol/L — ABNORMAL LOW (ref 101–111)
Creatinine, Ser: 1.08 mg/dL — ABNORMAL HIGH (ref 0.44–1.00)
GFR calc Af Amer: 58 mL/min — ABNORMAL LOW (ref >60)
GFR calc non Af Amer: 50 mL/min — ABNORMAL LOW (ref >60)
Glucose, Bld: 165 mg/dL — ABNORMAL HIGH (ref 65–99)
Potassium: 4.4 mmol/L (ref 3.5–5.1)
Sodium: 136 mmol/L (ref 135–145)

## 2017-04-25 LAB — CBC
HCT: 36.6 % (ref 36.0–46.0)
Hemoglobin: 11.7 g/dL — ABNORMAL LOW (ref 12.0–15.0)
MCH: 30.7 pg (ref 26.0–34.0)
MCHC: 32 g/dL (ref 30.0–36.0)
MCV: 96.1 fL (ref 78.0–100.0)
Platelets: 202 10*3/uL (ref 150–400)
RBC: 3.81 MIL/uL — ABNORMAL LOW (ref 3.87–5.11)
RDW: 14.2 % (ref 11.5–15.5)
WBC: 13.2 10*3/uL — ABNORMAL HIGH (ref 4.0–10.5)

## 2017-04-25 NOTE — Progress Notes (Signed)
PATIENT ID: [name]  MRN: [id number]  DOB/AGE:  03-Jul-1946 / 71 y.o.  2 Days Post-Op Procedure(s) (LRB): TOTAL KNEE ARTHROPLASTY (Left)    PROGRESS NOTE Subjective: Patient is alert, oriented, no Nausea, no Vomiting, yes passing gas. Taking PO well. Denies SOB, Chest or Calf Pain. Using Incentive Spirometer, PAS in place. Ambulate WBAT with pt walking 500 ft with therapy, Patient reports pain as 0/10 at rest .  Pt's O2 sats are in the mid 80's with activity.  Objective: Vital signs in last 24 hours: Vitals:   04/24/17 1223 04/24/17 1331 04/24/17 2014 04/25/17 0420  BP: (!) 127/51  (!) 127/53 (!) 121/58  Pulse: 62 67 63 (!) 58  Resp:   17   Temp: 98.4 F (36.9 C)  97.7 F (36.5 C) 98.2 F (36.8 C)  TempSrc: Oral  Oral Oral  SpO2: 94% (!) 85% 91% 90%  Weight:      Height:          Intake/Output from previous day: I/O last 3 completed shifts: In: 1432.9 [P.O.:1310; I.V.:122.9] Out: 750 [Urine:750]   Intake/Output this shift: No intake/output data recorded.   LABORATORY DATA:  Recent Labs  04/24/17 0645 04/25/17 0411  WBC 13.8* 13.2*  HGB 11.8* 11.7*  HCT 38.2 36.6  PLT 236 202  NA 134* 136  K 4.8 4.4  CL 100* 99*  CO2 26 23  BUN 18 20  CREATININE 1.22* 1.08*  GLUCOSE 161* 165*  CALCIUM 8.9 8.9    Examination: Neurologically intact Neurovascular intact Sensation intact distally Intact pulses distally Dorsiflexion/Plantar flexion intact Incision: dressing C/D/I and no drainage No cellulitis present Compartment soft}  Assessment:   2 Days Post-Op Procedure(s) (LRB): TOTAL KNEE ARTHROPLASTY (Left) ADDITIONAL DIAGNOSIS: Expected Acute Blood Loss Anemia, obesity, CAD, Pulm HTN, sleep apnea  Plan: PT/OT WBAT, AROM and PROM  DVT Prophylaxis:  SCDx72hrs, ASA 325 mg BID x 2 weeks DISCHARGE PLAN: Home DISCHARGE NEEDS: HHPT, Walker and 3-in-1 comode seat  Therapy is recommending home oxygen.     Arthur Aydelotte R 04/25/2017, 7:27 AM

## 2017-04-25 NOTE — Telephone Encounter (Signed)
lmtcb

## 2017-04-25 NOTE — Discharge Summary (Signed)
Patient ID: [name] MRN: [id number] DOB/AGE: 11/13/1945 71 y.o.  Admit date: 04/23/2017 Discharge date: 04/25/2017  Admission Diagnoses:  Principal Problem:   Degenerative arthritis of left knee Active Problems:   Primary osteoarthritis of left knee   Discharge Diagnoses:  Same  Past Medical History:  Diagnosis Date  . Anticoagulant long-term use    pradaxa  . Anxiety   . Arthritis    "fingers, lower back" (04/23/2017   . CAD (coronary artery disease) 2505,3976   post PTCA with bare-metal stenting to mid RCA in December 2004     . CHF (congestive heart failure) ([place])   . Chronic atrial fibrillation ([place]) 06/2007   Tachybradycardia pacemaker  . Chronic kidney disease    10% function - ?R, other kidney is compensating    . CVA (cerebral vascular accident) [facility]) [phone]   denies residual on 04/23/2017  . Depression   . Diplopia 06/19/2008   Qualifier: Diagnosis of  By: [name] MD, [name]   . Dysrhythmia    ATRIAL FIBRILATION  . Edema of lower extremity   . Hyperlipidemia   . Hypertension   . Inferior myocardial infarction [facility])    acute inferior wall mi/other medical hx  . Myocardial infarction ([place]) [id number]  . OSA on CPAP    last test- 2010  . Pacemaker   . Pneumonia 2014   tx. ----  [facility]  . Pulmonary hypertension ([place])    moderate pulmonary hypertension by 10/2016 echo and 10/2013 cardiac cath  . Shortness of breath   . Skin cancer    "cut off right Varney; burned off LLE" (04/23/2017)  . Spondylolisthesis   . TIA (transient ischemic attack) 2008  . Unspecified hemorrhoids without mention of complication 0/24/0973   Colonoscopy--Dr. Carlean Purl     Surgeries: Procedure(s): TOTAL KNEE ARTHROPLASTY on 04/23/2017   Consultants:   Discharged Condition: Improved  Hospital Course: ASAKO SALIBA is an 71 y.o. female who was admitted 04/23/2017 for operative treatment ofDegenerative arthritis of left knee. Patient has severe unremitting pain that  affects sleep, daily activities, and work/hobbies. After pre-op clearance the patient was taken to the operating room on 04/23/2017 and underwent  Procedure(s): TOTAL KNEE ARTHROPLASTY.    Patient was given perioperative antibiotics: Anti-infectives    Start     Dose/Rate Route Frequency Ordered Stop   04/23/17 0803  ceFAZolin (ANCEF) IVPB 2g/100 mL premix     2 g 200 mL/hr over 30 Minutes Intravenous On call to O.R. 04/23/17 0803 04/23/17 1011       Patient was given sequential compression devices, early ambulation, and chemoprophylaxis to prevent DVT.  Patient benefited maximally from hospital stay and there were no complications.    Recent vital signs: Patient Vitals for the past 24 hrs:  BP Temp Temp src Pulse Resp SpO2  04/25/17 0420 (!) 121/58 98.2 F (36.8 C) Oral (!) 58 - 90 %  04/24/17 2014 (!) 127/53 97.7 F (36.5 C) Oral 63 17 91 %  04/24/17 1331 - - - 67 - (!) 85 %  04/24/17 1223 (!) 127/51 98.4 F (36.9 C) Oral 62 - 94 %  04/24/17 0927 - - - 71 - -  04/24/17 0917 - - - - - 90 %  04/24/17 0817 (!) 117/47 98.4 F (36.9 C) Oral (!) 56 - 93 %     Recent laboratory studies:  Recent Labs  04/24/17 0645 04/25/17 0411  WBC 13.8* 13.2*  HGB 11.8* 11.7*  HCT 38.2 36.6  PLT 236 202  NA 134* 136  K 4.8 4.4  CL 100* 99*  CO2 26 23  BUN 18 20  CREATININE 1.22* 1.08*  GLUCOSE 161* 165*  CALCIUM 8.9 8.9     Discharge Medications:   Allergies as of 04/25/2017      Reactions   Adhesive [tape] Rash   Allergic to EKG stickers and defibrillation pads.      Medication List    STOP taking these medications   IBUPROFEN PM 200-25 MG Caps Generic drug:  Ibuprofen-Diphenhydramine HCl     TAKE these medications   acetaminophen 500 MG tablet Commonly known as:  TYLENOL Take 1,000 mg by mouth every 8 (eight) hours as needed for mild pain. What changed:  Another medication with the same name was removed. Continue taking this medication, and follow the directions you  see here.   amiodarone 200 MG tablet Commonly known as:  PACERONE Take 1 tablet (200 mg total) by mouth daily.   apixaban 2.5 MG Tabs tablet Commonly known as:  ELIQUIS Take 1 tablet (2.5 mg total) by mouth 2 (two) times daily. What changed:  medication strength  See the new instructions.   atorvastatin 40 MG tablet Commonly known as:  LIPITOR TAKE 1 TABLET BY MOUTH EVERY DAY   B-12 5000 MCG Caps Take 5,000 mcg by mouth at bedtime.   CALCIUM 600+D PO Take 1 tablet by mouth daily.   diltiazem 120 MG 24 hr capsule Commonly known as:  CARDIZEM CD Take 1 capsule (120 mg total) by mouth daily.   FLUoxetine 20 MG capsule Commonly known as:  PROZAC TAKE ONE CAPSULE BY MOUTH EVERY MORNING   fluticasone 50 MCG/ACT nasal spray Commonly known as:  FLONASE Place 1 spray into both nostrils daily as needed for allergies or rhinitis (before bedtime).   furosemide 80 MG tablet Commonly known as:  LASIX Take 1 tablet (80 mg total) by mouth 2 (two) times daily. What changed:  additional instructions   loratadine 10 MG tablet Commonly known as:  CLARITIN Take 1 tablet (10 mg total) by mouth daily.   Lysine 1000 MG Tabs Take 1,000 mg by mouth daily.   Lysine HCl 1000 MG Tabs Take 1,000 mg by mouth daily.   metoprolol succinate 50 MG 24 hr tablet Commonly known as:  TOPROL-XL Take 1 tablet (50 mg total) by mouth 2 (two) times daily. Take with or immediately following a meal. What changed:  additional instructions   multivitamin with minerals Tabs tablet Take 1 tablet by mouth daily. Centrum   nitroGLYCERIN 0.4 MG SL tablet Commonly known as:  NITROSTAT Place 1 tablet (0.4 mg total) under the tongue every 5 (five) minutes x 3 doses as needed for chest pain.   oxyCODONE-acetaminophen 5-325 MG tablet Commonly known as:  ROXICET Take 1 tablet by mouth every 4 (four) hours as needed.   pantoprazole 40 MG tablet Commonly known as:  PROTONIX Take 1 tablet (40 mg total) by  mouth daily.   potassium chloride SA 20 MEQ tablet Commonly known as:  KLOR-CON M20 Take 1 tablet (20 mEq total) by mouth daily. What changed:  when to take this   KLOR-CON M20 20 MEQ tablet Generic drug:  potassium chloride SA TAKE 1 TABLET BY MOUTH TWICE A DAY What changed:  Another medication with the same name was changed. Make sure you understand how and when to take each.   PRESCRIPTION MEDICATION Inhale into the lungs at bedtime. CPAP   timolol 0.5 %  ophthalmic solution Commonly known as:  TIMOPTIC Place 1 drop into the right eye daily.   tiZANidine 2 MG tablet Commonly known as:  ZANAFLEX Take 1 tablet (2 mg total) by mouth every 6 (six) hours as needed for muscle spasms.   vitamin C 1000 MG tablet Take 1,000 mg by mouth daily.   vitamin E 1000 UNIT capsule Take 1,000 Units by mouth daily.            Durable Medical Equipment        Start     Ordered   04/25/17 0000  For home use only DME oxygen    Question Answer Comment  Mode or (Route) Nasal cannula   Frequency Continuous (stationary and portable oxygen unit needed)   Oxygen delivery system Gas      04/25/17 0736   04/23/17 1459  DME Walker rolling  Once    Question:  Patient needs a walker to treat with the following condition  Answer:  Status post total left knee replacement   04/23/17 1458   04/23/17 1459  DME 3 n 1  Once     04/23/17 1458   04/23/17 1459  DME Bedside commode  Once    Question:  Patient needs a bedside commode to treat with the following condition  Answer:  Status post total left knee replacement   04/23/17 1458      Diagnostic Studies: Dg Chest 2 View  Result Date: 04/13/2017 CLINICAL DATA:  Preop.  Total knee replacement. EXAM: CHEST  2 VIEW COMPARISON:  11/14/2016 FINDINGS: Mild cardiomegaly. Double lead left subclavian pacemaker device and leads are stable and intact. Diffuse vascular congestion. Subtle Kerley B-lines at the right lung base. No pneumothorax. Scarring at  the left lung base. IMPRESSION: Mild CHF and interstitial edema. Electronically Signed   By: Marybelle Killings M.D.   On: 04/13/2017 11:10   Ct Chest High Resolution  Result Date: 04/09/2017 CLINICAL DATA:  71 year old female with worsening shortness of breath and decreased oxygen saturation. Evaluate for pulmonary fibrosis. EXAM: CT CHEST WITHOUT CONTRAST TECHNIQUE: Multidetector CT imaging of the chest was performed following the standard protocol without intravenous contrast. High resolution imaging of the lungs, as well as inspiratory and expiratory imaging, was performed. COMPARISON:  Chest CT 01/18/2017. FINDINGS: Cardiovascular: Heart size is mildly enlarged with left atrial dilatation. There is no significant pericardial fluid, thickening or pericardial calcification. There is aortic atherosclerosis, as well as atherosclerosis of the great vessels of the mediastinum and the coronary arteries, including calcified atherosclerotic plaque in the left anterior descending, left circumflex and right coronary arteries. Left-sided pacemaker device with lead tips terminating in the right atrium and right ventricular apex. Mediastinum/Nodes: Multiple borderline enlarged and mildly enlarged mediastinal and bilateral hilar lymph nodes, similar to the prior examination, measuring up to 13 mm in short axis in the low right paratracheal nodal station. Esophagus is unremarkable in appearance. No axillary lymphadenopathy. Lungs/Pleura: High-resolution images again demonstrate widespread areas of ground-glass attenuation in addition to several focal areas of more confluent nodular airspace consolidation scattered throughout the lungs bilaterally with associated interlobular septal thickening. The overall appearance is very similar to the prior study, although the discrete nodular areas of airspace consolidation have shifted slightly compared to the prior examination, and the spectrum of findings remains most compatible with  mild to moderate pulmonary edema. No areas of significant subpleural reticulation, traction bronchiectasis or frank honeycombing are noted. Inspiratory and expiratory imaging demonstrates moderate air trapping indicative of small airways  disease. No pleural effusions. Upper Abdomen: Visualized portions of the liver have a shrunken appearance and nodular contour, compatible with underlying cirrhosis. Status post cholecystectomy. 9 mm fatty attenuation (-79 HU) lesion in the anterior aspect of the interpolar region of the left kidney, similar to prior CT the abdomen and pelvis 08/05/2012, compatible with a small angiomyolipoma. Stable benign calcified lesion in the superior aspect of the spleen. Aortic atherosclerosis. Musculoskeletal: There are no aggressive appearing lytic or blastic lesions noted in the visualized portions of the skeleton. IMPRESSION: 1. No findings to suggest interstitial lung disease. 2. The appearance of the chest remains most compatible with congestive heart failure, as discussed above. 3. Aortic atherosclerosis, in addition to 3 vessel coronary artery disease. Assessment for potential risk factor modification, dietary therapy or pharmacologic therapy may be warranted, if clinically indicated. 4. Cirrhosis. 5. Small angiomyolipoma in the left kidney incidentally noted. 6. Additional incidental findings, as above. Aortic Atherosclerosis (ICD10-I70.0). Electronically Signed   By: Vinnie Langton M.D.   On: 04/09/2017 17:02    Disposition: 01-Home or Self Care  Discharge Instructions    Call MD / Call 911    Complete by:  As directed    If you experience chest pain or shortness of breath, CALL 911 and be transported to the hospital emergency room.  If you develope a fever above 101 F, pus (white drainage) or increased drainage or redness at the wound, or calf pain, call your surgeon's office.   Constipation Prevention    Complete by:  As directed    Drink plenty of fluids.  Prune juice  may be helpful.  You may use a stool softener, such as Colace (over the counter) 100 mg twice a day.  Use MiraLax (over the counter) for constipation as needed.   Diet - low sodium heart healthy    Complete by:  As directed    Discharge instructions    Complete by:  As directed    Home oxygen until follow up with primary care provider or pulmonologist   Driving restrictions    Complete by:  As directed    No driving for 2 weeks   For home use only DME oxygen    Complete by:  As directed    Mode or (Route):  Nasal cannula   Frequency:  Continuous (stationary and portable oxygen unit needed)   Oxygen delivery system:  Gas   Increase activity slowly as tolerated    Complete by:  As directed    Patient may shower    Complete by:  As directed    You may shower without a dressing once there is no drainage.  Do not wash over the wound.  If drainage remains, cover wound with plastic wrap and then shower.      Follow-up Information    Frederik Pear, MD Follow up in 2 week(s).   Specialty:  Orthopedic Surgery Contact information: [place] [address] [phone]            Signed: Theodosia Quay 04/25/2017, 7:36 AM

## 2017-04-25 NOTE — Progress Notes (Signed)
Occupational Therapy Treatment Patient Details Name: [name] MRN: [id number] DOB: 1946/04/15 Today's Date: 04/25/2017    History of present illness Pt is a 71 y/o female s/p elective L TKA on 04/23/17. PMH inlcudes DM, anxiety, depression, a fib, MI, CVA, CHF, CKD, TIA, and s/p pacemaker insertion.    OT comments  Pt making steady progress towards goals, reviewed LB dressing with AE this session with Pt demonstrating good carry over/understanding. Pt completed simulated shower transfer at RW level with MinGuard assist. O2 remaining above 90% on 2L O2 during minimal standing activity within room. Feel Pt will safely progress home with available assist from spouse. Questions answered throughout. Will continue to follow while Pt is in acute setting.    Follow Up Recommendations  DC plan and follow up therapy as arranged by surgeon;Supervision/Assistance - 24 hour    Equipment Recommendations  None recommended by OT          Precautions / Restrictions Precautions Precautions: Knee;Other (comment) Precaution Comments: watch SpO2 Restrictions Weight Bearing Restrictions: Yes LLE Weight Bearing: Weight bearing as tolerated       Mobility Bed Mobility Overal bed mobility: Modified Independent             General bed mobility comments: Received sitting in recliner   Transfers Overall transfer level: Modified independent Equipment used: Rolling walker (2 wheeled) Transfers: Sit to/from Stand Sit to Stand: Modified independent (Device/Increase time)         General transfer comment: Cues for improved eccentric control when sitting    Balance Overall balance assessment: Needs assistance Sitting-balance support: No upper extremity supported;Feet supported Sitting balance-Leahy Scale: Good     Standing balance support: Bilateral upper extremity supported;During functional activity Standing balance-Leahy Scale: Fair                             ADL either  performed or assessed with clinical judgement   ADL Overall ADL's : Needs assistance/impaired               Lower Body Bathing Details (indicate cue type and reason): reviewed AE for completing task      Lower Body Dressing: Minimal assistance;Sit to/from stand;With adaptive equipment Lower Body Dressing Details (indicate cue type and reason): Education provided on AE for completing task with Pt return demonstrating with good carryover, using sock aide to don socks and reacher simulating donning pants/underwear with use of pillowcase          Tub/ Shower Transfer: Walk-in shower;Min guard;Ambulation;Shower Technical sales engineer Details (indicate cue type and reason): Pt demonstrates good carryover of sequence  Functional mobility during ADLs: Min guard;Rolling walker General ADL Comments: Pt on 2L O2 and sats remaining above 90% during this session; Pt demonstrates good understanding of education and compensatory techniques for completing ADL tasks                        Cognition Arousal/Alertness: Awake/alert Behavior During Therapy: WFL for tasks assessed/performed Overall Cognitive Status: Within Functional Limits for tasks assessed                                                            Pertinent Vitals/ Pain  Pain Assessment: Faces Faces Pain Scale: Hurts a little bit Pain Location: L knee  Pain Descriptors / Indicators: Sore Pain Intervention(s): Monitored during session                                                          Frequency  Min 2X/week        Progress Toward Goals  OT Goals(current goals can now be found in the care plan section)  Progress towards OT goals: Progressing toward goals  Acute Rehab OT Goals Patient Stated Goal: to go home  OT Goal Formulation: With patient Time For Goal Achievement: 05/08/17 Potential to Achieve Goals: Good  Plan Discharge  plan remains appropriate                     AM-PAC PT "6 Clicks" Daily Activity     Outcome Measure   Help from another person eating meals?: None Help from another person taking care of personal grooming?: A Little Help from another person toileting, which includes using toliet, bedpan, or urinal?: A Little Help from another person bathing (including washing, rinsing, drying)?: A Little Help from another person to put on and taking off regular upper body clothing?: None Help from another person to put on and taking off regular lower body clothing?: A Little 6 Click Score: 20    End of Session Equipment Utilized During Treatment: Gait belt;Rolling walker;Oxygen  OT Visit Diagnosis: Other abnormalities of gait and mobility (R26.89)   Activity Tolerance Patient tolerated treatment well   Patient Left with call bell/phone within reach;in chair   Nurse Communication Mobility status        Time: 0211-1552 OT Time Calculation (min): 22 min  Charges: OT General Charges $OT Visit: 1 Visit  [name], OT Pager [phone] 04/25/2017    [name] 04/25/2017, 8:57 AM

## 2017-04-25 NOTE — Progress Notes (Signed)
Physical Therapy Treatment Patient Details Name: [name] MRN: [id number] DOB: Dec 17, 1945 Today's Date: 04/25/2017    History of Present Illness Pt is a 71 y/o female s/p elective L TKA on 04/23/17. PMH inlcudes DM, anxiety, depression, a fib, MI, CVA, CHF, CKD, TIA, and s/p pacemaker insertion.    PT Comments    Pt progressing well with mobility. Mod indep for transfers and ambulation with RW; continues to require multiple standing rest breaks with amb on room air secondary to SpO2 down to 84%, requiring rest and deep breathing to return to >90%. Ambulated an additional 150' with RW on 2L O2 [place], with SpO2 >91% throughout. From a mobility perspective, feel pt is safe to return home with assist from husband, HHPT, and supplemental O2. All education completed and questions answered; pt has met acute PT goals. D/c acute PT.    Follow Up Recommendations  DC plan and follow up therapy as arranged by surgeon;Home health PT     Equipment Recommendations  None recommended by PT    Recommendations for Other Services       Precautions / Restrictions Precautions Precautions: Knee;Other (comment) Precaution Comments: watch SpO2 Restrictions Weight Bearing Restrictions: Yes LLE Weight Bearing: Weight bearing as tolerated    Mobility  Bed Mobility Overal bed mobility: Modified Independent             General bed mobility comments: Received sitting EOB; pt reports indep with bed mobility  Transfers Overall transfer level: Modified independent Equipment used: Rolling walker (2 wheeled) Transfers: Sit to/from Stand Sit to Stand: Modified independent (Device/Increase time)         General transfer comment: Cues for improved eccentric control when sitting  Ambulation/Gait Ambulation/Gait assistance: Modified independent (Device/Increase time) Ambulation Distance (Feet): 700 Feet Assistive device: Rolling walker (2 wheeled) Gait Pattern/deviations: Step-through  pattern;Decreased stride length Gait velocity: Decreased   General Gait Details: Pt amb 550' with RW on room air with SpO2 down to 84%; requiring multiple standing rest breaks and deep breathing technique to bring SpO2 >90%. Amb an additional 150' on 2L O2 [place] with SpO2 remaining >91%. Pt able to check pulse ox on finger and stop and rest accordingly when SpO2 <90%   Stairs            Wheelchair Mobility    Modified Rankin (Stroke Patients Only)       Balance Overall balance assessment: Needs assistance Sitting-balance support: No upper extremity supported;Feet supported Sitting balance-Leahy Scale: Good     Standing balance support: Bilateral upper extremity supported;During functional activity Standing balance-Leahy Scale: Fair                              Cognition Arousal/Alertness: Awake/alert Behavior During Therapy: WFL for tasks assessed/performed Overall Cognitive Status: Within Functional Limits for tasks assessed                                        Exercises      General Comments        Pertinent Vitals/Pain Pain Assessment: Faces Faces Pain Scale: Hurts a little bit Pain Location: L knee  Pain Descriptors / Indicators: Sore Pain Intervention(s): Monitored during session    Home Living                      Prior  Function            PT Goals (current goals can now be found in the care plan section) Acute Rehab PT Goals Patient Stated Goal: to go home  PT Goal Formulation: With patient Time For Goal Achievement: 04/30/17 Potential to Achieve Goals: Good Progress towards PT goals: Progressing toward goals    Frequency    7X/week      PT Plan Current plan remains appropriate    Co-evaluation              AM-PAC PT "6 Clicks" Daily Activity  Outcome Measure  Difficulty turning over in bed (including adjusting bedclothes, sheets and blankets)?: None Difficulty moving from lying on back  to sitting on the side of the bed? : None Difficulty sitting down on and standing up from a chair with arms (e.g., wheelchair, bedside commode, etc,.)?: None Help needed moving to and from a bed to chair (including a wheelchair)?: None Help needed walking in hospital room?: None Help needed climbing 3-5 steps with a railing? : A Little 6 Click Score: 23    End of Session Equipment Utilized During Treatment: Gait belt;Oxygen Activity Tolerance: Patient tolerated treatment well Patient left: in chair;with call bell/phone within reach Nurse Communication: Mobility status PT Visit Diagnosis: Difficulty in walking, not elsewhere classified (R26.2);Muscle weakness (generalized) (M62.81) Pain - Right/Left: Left Pain - part of body: Knee     Time: 9449-6759 PT Time Calculation (min) (ACUTE ONLY): 26 min  Charges:  $Gait Training: 23-37 mins                    G Codes:      Mabeline Caras, PT, DPT Acute Rehab Services  Pager: [place] 04/25/2017, 8:26 AM

## 2017-04-25 NOTE — Telephone Encounter (Signed)
-----   Message from Will Meredith Leeds, MD sent at 04/23/2017  9:49 AM EDT ----- Regarding: FW: OR 10/22 Due to liver findings on CT, plan to stop amiodarone.  ----- Message ----- From: Thayer Headings, MD Sent: 04/16/2017   4:37 PM To: Will Meredith Leeds, MD, # Subject: RE: OR 10/22                                   Donalynn Furlong,  She may hold her Eliquis prior to ortho surgery .   2 days should be long enough.   I typically hold for 3 days only if its neuro or spinal surgery or if they have renal impairment.  Dr. Curt Bears is following her amio management.   We can se her back after her TKA and work through the liver issues.     Thanks,  Phil  ----- Message ----- From: Rutherford Limerick Sent: 04/16/2017   3:52 PM To: Thayer Headings, MD Subject: OR 10/22                                       Dr. Acie Fredrickson, Hi, your patient [name] is a scheduled for left TKA on 04/23/17 with Dr. Mayer Camel. She had a pre-op nurse visit on Friday. I received her chart today for anesthesia review. She has known CAD, diastolic CHF, afib and PPM (Dr. Curt Bears), OSA, moderate pulmonary hypertension by recent echo.  She was recently evaluated by Dr. Chase Caller for abnormal PFTs and dyspnea. He ordered a chest CT to evaluate for possible ILD versus CHF as possible sources. 04/09/17 CT did not show ILD, but did have findings most compatible with mild to moderate pulmonary edema and also evidence of cirrhosis. Her meds include Toprol, diltiazem, amiodarone, Eliquis, Lipitor, Lasix 80 mg BID. AST/ALT were normal in July. Preop CBC WNL.   Her pulmonary clearance is still pending Dr. Golden Pop input following chest CT. Dr. Damita Dunnings office is reaching out to cardiology regarding permission to hold Eliquis for 3 days preop, but I also wanted to make you aware of chest CT findings of pulmonary edema and cirrhosis (since she is on amiodarone). I was unsure if findings were concerning to warrant need for re-evaluation  or only if she is symptomatic.  George Hugh [facility] Short Stay Center/Anesthesiology Phone [phone] 04/16/2017 4:25 PM

## 2017-04-25 NOTE — Care Management Note (Signed)
Case Management Note  Patient Details  Name: [name] MRN: [id number] Date of Birth: 04-02-1946  Subjective/Objective:   71 yr old female s/p left total hip artheroplasty.                 Action/Plan: Patient was preoperatively setup with [facility], no changes. Patient has been desating and has required oxygen via nasal canula. Case manager has requested oxygen as ordered by MD. Bowler will provide. Patient will have assistance at discharge.     Expected Discharge Date:  04/25/17               Expected Discharge Plan:  [place]  In-House Referral:  NA  Discharge planning Services  CM Consult  Post Acute Care Choice:  Home Health Choice offered to:  Patient  DME Arranged:  3-N-1, CPM, Walker rolling, Oxygen DME Agency:  [place]  HH Arranged:  PT, RN [place] Agency:  [facility] (formerly [employer])  Status of Service:  Completed, signed off  If discussed at [employer], dates discussed:    Additional Comments:  Ninfa Meeker, RN 04/25/2017, 2:02 PM

## 2017-04-26 DIAGNOSIS — M1712 Unilateral primary osteoarthritis, left knee: Secondary | ICD-10-CM | POA: Diagnosis not present

## 2017-04-26 DIAGNOSIS — I5040 Unspecified combined systolic (congestive) and diastolic (congestive) heart failure: Secondary | ICD-10-CM | POA: Diagnosis not present

## 2017-04-26 DIAGNOSIS — G4733 Obstructive sleep apnea (adult) (pediatric): Secondary | ICD-10-CM | POA: Diagnosis not present

## 2017-04-26 NOTE — Telephone Encounter (Signed)
°  Follow Up ° °Returning call from yesterday. Please call. °

## 2017-04-26 NOTE — Telephone Encounter (Signed)
Spoke with patient to discuss plan of care from discussions between Drs. Ramaswamy, Nahser and [employer]. I advised patient that per Dr. Curt Bears, she is to stop amiodarone. I reviewed the recommendation for the right and left heart cath with her and she verbalized agreement to proceed with scheduling. I have scheduled her to see Dr. Acie Fredrickson on Tuesday Nov. 6. Per her request, I have scheduled right and left heart cath for Wednesday Nov. 7 with Dr. Burt Knack. I will advise her to hold her eliquis for 48 hours (last dose Monday 11/5 am dose) prior to the procedure and she will get lab work at [facility]	[place] on 11/6. She verbalized understanding and agreement with plan of care and thanked me for the call.

## 2017-04-26 NOTE — Telephone Encounter (Signed)
[name], Dr. Elmarie Shiley nurse is going to call patient to scheduled cath and she will instruct pt to stop Amiodarone. Pt will have follow up w/ Nahser post cath and he will address AFib at that time, if needed.

## 2017-04-27 ENCOUNTER — Telehealth: Payer: Self-pay | Admitting: Internal Medicine

## 2017-04-27 DIAGNOSIS — Z471 Aftercare following joint replacement surgery: Secondary | ICD-10-CM | POA: Diagnosis not present

## 2017-04-27 DIAGNOSIS — I482 Chronic atrial fibrillation: Secondary | ICD-10-CM | POA: Diagnosis not present

## 2017-04-27 DIAGNOSIS — M5136 Other intervertebral disc degeneration, lumbar region: Secondary | ICD-10-CM | POA: Diagnosis not present

## 2017-04-27 DIAGNOSIS — E785 Hyperlipidemia, unspecified: Secondary | ICD-10-CM | POA: Diagnosis not present

## 2017-04-27 DIAGNOSIS — K760 Fatty (change of) liver, not elsewhere classified: Secondary | ICD-10-CM | POA: Diagnosis not present

## 2017-04-27 DIAGNOSIS — I110 Hypertensive heart disease with heart failure: Secondary | ICD-10-CM | POA: Diagnosis not present

## 2017-04-27 DIAGNOSIS — I2510 Atherosclerotic heart disease of native coronary artery without angina pectoris: Secondary | ICD-10-CM | POA: Diagnosis not present

## 2017-04-27 DIAGNOSIS — I2729 Other secondary pulmonary hypertension: Secondary | ICD-10-CM | POA: Diagnosis not present

## 2017-04-27 DIAGNOSIS — I5033 Acute on chronic diastolic (congestive) heart failure: Secondary | ICD-10-CM | POA: Diagnosis not present

## 2017-04-27 DIAGNOSIS — M1991 Primary osteoarthritis, unspecified site: Secondary | ICD-10-CM | POA: Diagnosis not present

## 2017-04-27 DIAGNOSIS — I4892 Unspecified atrial flutter: Secondary | ICD-10-CM | POA: Diagnosis not present

## 2017-04-27 DIAGNOSIS — E119 Type 2 diabetes mellitus without complications: Secondary | ICD-10-CM | POA: Diagnosis not present

## 2017-04-27 DIAGNOSIS — I252 Old myocardial infarction: Secondary | ICD-10-CM | POA: Diagnosis not present

## 2017-04-27 NOTE — Telephone Encounter (Signed)
Called pt and scheduled her for an appt with MR 06/04/17 at 10:00.  Nothing further needed.

## 2017-04-27 NOTE — Telephone Encounter (Signed)
[name] will be having her cath early nov 2018. Please give her appt end nov 2018/early dec 2018 to see me  Thanks  Dr. Brand Males, M.D., [facility].C.P Pulmonary and Critical Care Medicine Staff Physician [place] [facility] Pager: [phone], If no answer or between  15:00h - 7:00h: call [phone]  04/27/2017 11:19 AM

## 2017-04-30 DIAGNOSIS — Z471 Aftercare following joint replacement surgery: Secondary | ICD-10-CM | POA: Diagnosis not present

## 2017-04-30 DIAGNOSIS — I2729 Other secondary pulmonary hypertension: Secondary | ICD-10-CM | POA: Diagnosis not present

## 2017-04-30 DIAGNOSIS — M5136 Other intervertebral disc degeneration, lumbar region: Secondary | ICD-10-CM | POA: Diagnosis not present

## 2017-04-30 DIAGNOSIS — I110 Hypertensive heart disease with heart failure: Secondary | ICD-10-CM | POA: Diagnosis not present

## 2017-04-30 DIAGNOSIS — K760 Fatty (change of) liver, not elsewhere classified: Secondary | ICD-10-CM | POA: Diagnosis not present

## 2017-04-30 DIAGNOSIS — E119 Type 2 diabetes mellitus without complications: Secondary | ICD-10-CM | POA: Diagnosis not present

## 2017-04-30 DIAGNOSIS — I4892 Unspecified atrial flutter: Secondary | ICD-10-CM | POA: Diagnosis not present

## 2017-04-30 DIAGNOSIS — I2510 Atherosclerotic heart disease of native coronary artery without angina pectoris: Secondary | ICD-10-CM | POA: Diagnosis not present

## 2017-04-30 DIAGNOSIS — M1991 Primary osteoarthritis, unspecified site: Secondary | ICD-10-CM | POA: Diagnosis not present

## 2017-04-30 DIAGNOSIS — I252 Old myocardial infarction: Secondary | ICD-10-CM | POA: Diagnosis not present

## 2017-04-30 DIAGNOSIS — I5033 Acute on chronic diastolic (congestive) heart failure: Secondary | ICD-10-CM | POA: Diagnosis not present

## 2017-04-30 DIAGNOSIS — I482 Chronic atrial fibrillation: Secondary | ICD-10-CM | POA: Diagnosis not present

## 2017-04-30 DIAGNOSIS — E785 Hyperlipidemia, unspecified: Secondary | ICD-10-CM | POA: Diagnosis not present

## 2017-05-07 DIAGNOSIS — I2510 Atherosclerotic heart disease of native coronary artery without angina pectoris: Secondary | ICD-10-CM | POA: Diagnosis not present

## 2017-05-07 DIAGNOSIS — Z471 Aftercare following joint replacement surgery: Secondary | ICD-10-CM | POA: Diagnosis not present

## 2017-05-07 DIAGNOSIS — M5136 Other intervertebral disc degeneration, lumbar region: Secondary | ICD-10-CM | POA: Diagnosis not present

## 2017-05-07 DIAGNOSIS — I252 Old myocardial infarction: Secondary | ICD-10-CM | POA: Diagnosis not present

## 2017-05-07 DIAGNOSIS — E785 Hyperlipidemia, unspecified: Secondary | ICD-10-CM | POA: Diagnosis not present

## 2017-05-07 DIAGNOSIS — K760 Fatty (change of) liver, not elsewhere classified: Secondary | ICD-10-CM | POA: Diagnosis not present

## 2017-05-07 DIAGNOSIS — M1991 Primary osteoarthritis, unspecified site: Secondary | ICD-10-CM | POA: Diagnosis not present

## 2017-05-07 DIAGNOSIS — I5033 Acute on chronic diastolic (congestive) heart failure: Secondary | ICD-10-CM | POA: Diagnosis not present

## 2017-05-07 DIAGNOSIS — E119 Type 2 diabetes mellitus without complications: Secondary | ICD-10-CM | POA: Diagnosis not present

## 2017-05-07 DIAGNOSIS — I2729 Other secondary pulmonary hypertension: Secondary | ICD-10-CM | POA: Diagnosis not present

## 2017-05-07 DIAGNOSIS — I4892 Unspecified atrial flutter: Secondary | ICD-10-CM | POA: Diagnosis not present

## 2017-05-07 DIAGNOSIS — I110 Hypertensive heart disease with heart failure: Secondary | ICD-10-CM | POA: Diagnosis not present

## 2017-05-07 DIAGNOSIS — I482 Chronic atrial fibrillation: Secondary | ICD-10-CM | POA: Diagnosis not present

## 2017-05-08 ENCOUNTER — Encounter: Payer: Self-pay | Admitting: Cardiovascular Disease

## 2017-05-08 ENCOUNTER — Ambulatory Visit (INDEPENDENT_AMBULATORY_CARE_PROVIDER_SITE_OTHER): Payer: PPO | Admitting: Cardiovascular Disease

## 2017-05-08 VITALS — BP 118/80 | HR 64 | Ht 66.5 in | Wt 244.4 lb

## 2017-05-08 DIAGNOSIS — I2510 Atherosclerotic heart disease of native coronary artery without angina pectoris: Secondary | ICD-10-CM | POA: Diagnosis not present

## 2017-05-08 DIAGNOSIS — I480 Paroxysmal atrial fibrillation: Secondary | ICD-10-CM | POA: Diagnosis not present

## 2017-05-08 DIAGNOSIS — M25562 Pain in left knee: Secondary | ICD-10-CM | POA: Diagnosis not present

## 2017-05-08 DIAGNOSIS — I5032 Chronic diastolic (congestive) heart failure: Secondary | ICD-10-CM

## 2017-05-08 DIAGNOSIS — I5033 Acute on chronic diastolic (congestive) heart failure: Secondary | ICD-10-CM

## 2017-05-08 DIAGNOSIS — Z471 Aftercare following joint replacement surgery: Secondary | ICD-10-CM | POA: Diagnosis not present

## 2017-05-08 DIAGNOSIS — Z96652 Presence of left artificial knee joint: Secondary | ICD-10-CM | POA: Diagnosis not present

## 2017-05-08 DIAGNOSIS — Z9861 Coronary angioplasty status: Secondary | ICD-10-CM

## 2017-05-08 LAB — CBC
Hematocrit: 39 % (ref 34.0–46.6)
Hemoglobin: 13.3 g/dL (ref 11.1–15.9)
MCH: 30.2 pg (ref 26.6–33.0)
MCHC: 34.1 g/dL (ref 31.5–35.7)
MCV: 89 fL (ref 79–97)
Platelets: 338 10*3/uL (ref 150–379)
RBC: 4.4 x10E6/uL (ref 3.77–5.28)
RDW: 14.6 % (ref 12.3–15.4)
WBC: 14.3 10*3/uL — ABNORMAL HIGH (ref 3.4–10.8)

## 2017-05-08 LAB — BASIC METABOLIC PANEL
BUN/Creatinine Ratio: 25 (ref 12–28)
BUN: 27 mg/dL (ref 8–27)
CO2: 30 mmol/L — ABNORMAL HIGH (ref 20–29)
Calcium: 9.8 mg/dL (ref 8.7–10.3)
Chloride: 97 mmol/L (ref 96–106)
Creatinine, Ser: 1.08 mg/dL — ABNORMAL HIGH (ref 0.57–1.00)
GFR calc Af Amer: 60 mL/min/{1.73_m2} (ref >59)
GFR calc non Af Amer: 52 mL/min/{1.73_m2} — ABNORMAL LOW (ref >59)
Glucose: 102 mg/dL — ABNORMAL HIGH (ref 65–99)
Potassium: 5.3 mmol/L — ABNORMAL HIGH (ref 3.5–5.2)
Sodium: 138 mmol/L (ref 134–144)

## 2017-05-08 LAB — PROTIME-INR
INR: 1 (ref 0.8–1.2)
Prothrombin Time: 11 s (ref 9.1–12.0)

## 2017-05-08 NOTE — H&P (View-Only) (Signed)
[name] Date of Birth  Nov 15, 1945 [facility] [address]    [place], [place]  [address] [phone]  Fax  [phone]   Problem list: 1. Atrial fibrillation - s/p ablation September 29, 2016 2. Status post pacemaker implantation 3. Coronary artery disease-status post PTCA and stenting of her right coronary artery 4. Hyperlipidemia 5. Hypertension 6. Obstructive sleep apnea-currently on CPAP 7.   History of Present Illness:  [name] is a 71 year old female with a history of atrial fibrillation. She status post pacemaker implantation. She also has a history of coronary artery disease and is status post PTCA and stenting of the right coronary artery. She also has a history of hyperlipidemia, hypertension, and sleep apnea.  She uses a CPAP mask at night.  The CPAP mask has really helped her stay in normal sinus rhythm.  She complains of having some episodes of chest tightness. This occurs at times. One episode was relieved with sublingual nitroglycerin. She states that it is not nearly as severe as her previous episodes of angina.  January 13, 21014: She was hospitalized in November with severe shortness breath and "double pneumonia". She is slowly recovering.  She's looking forward to  getting back into her exercise program.  She has not been exercising at all.  She's had some leg edema.  She denies any chest pain or shortness breath.  She's has mild leg swelling for the past several months. She does not add salt any of her food. She tries to stay away from salty foods and processed meat.  September 18, 2013:  She remains short of breath  - especialy with exertion  Echo in Jan. 2014:   - Left ventricle: The cavity size was normal. Wall thickness was increased in a pattern of mild LVH. Systolic function was normal. The estimated ejection fraction was in the range of 60% to 65%. - Pulmonary arteries: PA peak pressure: 23mm Hg   October 03, 2013:  Level was seen about  2 weeks ago with some increasing shortness of breath. She was noted to have moderate pulmonary hypertension on echo. She started on diltiazem. She has fairly well preserved left ventricle systolic function.  We started her on Cardizem XL 180 mg a day. She has not done well since that medication in addition. She's more short of breath. She's gained about 5 pounds.  She presents today for her echocardiogram. She is very short of breath and was worked into the schedule. Her O2 saturations at rest were 86%. We placed  oxygen on her and her O2 saturations increased above 90%. She is clearly more short of breath after the addition of the diltiazem.   She has not been eating any extra salt .  Has been taking her lasix.   She denies any chest pain - specifically was asked about pleuretic CP.  Had orthopnea until we placed the oxygen on her.   10/20/2013: [name] was admitted to the hospital with severe shortness breath when I saw her several weeks ago. She was found to be volume overload. She was aggressively diuresed and lost about 10 pounds of weight. Cardiac catheterization following that diuresis revealed only minimal coronary artery irregularities. Her left ventricular systolic function was normal. Her PA pressures were moderately elevated with an estimated PA pressure of around 57.   She was discharged on the same dose of Lasix that she was on at admission.   She is avoiding salt.    Dec. 3, 2015:  [name] is a 71 yo followed for chronic diastolic CHf and pulmonary hypertension ( est. PA pressure of 70)  [name] has been back in the hospital since I last saw her.  She has  Increased her lasix which has helped.  She does a pretty good job of avoiding salt.   Sept. 27, 2016:  Is doing well.  Has some dyspnea - she thinks likely due to weight gain . No cardiac symptoms .  Needs to have back surgery .   October 06, 2015:  Overall doing well. Had her back surgery in Oct.  Wt. 267 lbs today   Jan. 4,  2018:  [name] is seen back today following a recent hospitalization in Sept. 2017.   She was complaining of dyspnea , cough. She was cardioverted  She has diastolic dysfunction   She is now back in atrial fib. She new that she had converted back into a-fib.   HR are typically elevated when she is in atrial fib.   Has had some right flank pain .   Was found to have an atrophic right kidney.  Was in the hospital in [place], [place].   Has not tried a higher dose of Tikosyn.    Labs from her recent hospitalization on 06/24/2016 show a potassium level of 4.3. Her magnesium level was 2.0.  October 16, 2016:  [name] is seen today  She has had an A-Fib ablation ( September 29, 2016) since I have seen her She is very shaky today - having trouble getting her breath while walking in today , her lasix was held following her ablation.    She restarted her Lasix 2 weeks ago .   She is feeling generally better since restarting the Lasix   11/21/2016: [name] was recently hospitalized with acute on chronic diastolic congestive heart failure. She had been traveling quite a bit and had been eating lots of salty food. She had also been holding various doses of Lasix set she was traveling. She was diuresed 7 lbs.  Feeling much better   She was supposed to decrease her amio to 200 mg daily but has continued on the 200 mg BID.  Nov. 6, 2018: [name] is seen today for further evaluation of her increasing shortness of breath.  Her amiodarone was stopped several weeks ago. Had left knee replacement 2 weeks ago .   Is doing well.  Is having some chest tightness . Is avoiding salt.    Current Outpatient Medications on File Prior to Visit  Medication Sig Dispense Refill  . acetaminophen (TYLENOL) 500 MG tablet Take 1,000 mg by mouth 2 (two) times daily.     [name] apixaban (ELIQUIS) 2.5 MG TABS tablet Take 1 tablet (2.5 mg total) by mouth 2 (two) times daily. 30 tablet 0  . Ascorbic Acid (VITAMIN C) 1000 MG tablet Take 1,000 mg by  mouth daily.    [name] atorvastatin (LIPITOR) 40 MG tablet TAKE 1 TABLET BY MOUTH EVERY DAY (Patient taking differently: TAKE 40mg  BY MOUTH EVERY DAY) 90 tablet 2  . Calcium Carbonate-Vitamin D (CALCIUM 600+D PO) Take 1 tablet by mouth daily.    . Cyanocobalamin (B-12) 5000 MCG CAPS Take 5,000 mcg by mouth at bedtime.     [name] diltiazem (CARDIZEM CD) 120 MG 24 hr capsule Take 1 capsule (120 mg total) by mouth daily. 30 capsule 11  . FLUoxetine (PROZAC) 20 MG capsule TAKE ONE CAPSULE BY MOUTH EVERY MORNING (Patient taking differently: TAKE 20mg  BY MOUTH EVERY MORNING)  90 capsule 1  . fluticasone (FLONASE) 50 MCG/ACT nasal spray Place 1 spray into both nostrils daily as needed for allergies or rhinitis (before bedtime).     . furosemide (LASIX) 80 MG tablet Take 1 tablet (80 mg total) by mouth 2 (two) times daily. (Patient taking differently: Take 80 mg by mouth 2 (two) times daily. Takes 0700 and 1500) 60 tablet 11  . KLOR-CON M20 20 MEQ tablet TAKE 1 TABLET BY MOUTH TWICE A DAY (Patient taking differently: TAKE 20MEq BY MOUTH TWICE A DAY) 180 tablet 3  . loratadine (CLARITIN) 10 MG tablet Take 1 tablet (10 mg total) by mouth daily. 30 tablet 5  . Lysine 1000 MG TABS Take 1,000 mg by mouth daily.    . metoprolol succinate (TOPROL-XL) 50 MG 24 hr tablet Take 1 tablet (50 mg total) by mouth 2 (two) times daily. Take with or immediately following a meal. (Patient taking differently: Take 50 mg by mouth 2 (two) times daily. Take with or immediately following a meal. Verified succinate twice daily) 60 tablet 11  . Multiple Vitamin (MULTIVITAMIN WITH MINERALS) TABS tablet Take 1 tablet by mouth daily. Centrum    . nitroGLYCERIN (NITROSTAT) 0.4 MG SL tablet Place 1 tablet (0.4 mg total) under the tongue every 5 (five) minutes x 3 doses as needed for chest pain. 25 tablet 6  . oxyCODONE-acetaminophen (ROXICET) 5-325 MG tablet Take 1 tablet by mouth every 4 (four) hours as needed. 60 tablet 0  . pantoprazole  (PROTONIX) 40 MG tablet Take 1 tablet (40 mg total) by mouth daily. 30 tablet 2  . Polyethyl Glycol-Propyl Glycol (SYSTANE) 0.4-0.3 % SOLN Place 1 drop into the right eye daily as needed (dry eyes).    [name] PRESCRIPTION MEDICATION Inhale into the lungs at bedtime. CPAP    . tiZANidine (ZANAFLEX) 2 MG tablet Take 1 tablet (2 mg total) by mouth every 6 (six) hours as needed for muscle spasms. 60 tablet 0  . vitamin E 1000 UNIT capsule Take 1,000 Units by mouth daily.     No current facility-administered medications on file prior to visit.     Allergies  Allergen Reactions  . Adhesive [Tape] Rash    Allergic to EKG stickers and defibrillation pads.    Past Medical History:  Diagnosis Date  . Anticoagulant long-term use    pradaxa  . Anxiety   . Arthritis    "fingers, lower back" (04/23/2017   . CAD (coronary artery disease) 8469,6295   post PTCA with bare-metal stenting to mid RCA in December 2004     . CHF (congestive heart failure) ([place])   . Chronic atrial fibrillation ([place]) 06/2007   Tachybradycardia pacemaker  . Chronic kidney disease    10% function - ?R, other kidney is compensating    . CVA (cerebral vascular accident) [facility]) 2841,3244   denies residual on 04/23/2017  . Depression   . Diplopia 06/19/2008   Qualifier: Diagnosis of  By: [name] MD, [name]   . Dysrhythmia    ATRIAL FIBRILATION  . Edema of lower extremity   . Hyperlipidemia   . Hypertension   . Inferior myocardial infarction [facility])    acute inferior wall mi/other medical hx  . Myocardial infarction ([place]) [id number]  . OSA on CPAP    last test- 2010  . Pacemaker   . Pneumonia 2014   tx. ----  [facility]  . Pulmonary hypertension ([place])    moderate pulmonary hypertension by 10/2016 echo and 10/2013 cardiac  cath  . Shortness of breath   . Skin cancer    "cut off right [place]; burned off LLE" (04/23/2017)  . Spondylolisthesis   . TIA (transient ischemic attack) 2008  . Unspecified hemorrhoids without  mention of complication 1/44/8185   Colonoscopy--Dr. Carlean Purl     Past Surgical History:  Procedure Laterality Date  . APPENDECTOMY  1984  . BACK SURGERY    . CATARACT EXTRACTION W/ INTRAOCULAR LENS  IMPLANT, BILATERAL Bilateral 01/15/2017- 03/2017  . CORONARY ANGIOPLASTY  X 2  . CORONARY ANGIOPLASTY WITH STENT PLACEMENT  1998; ~ 2007; ?date   "1 stent; replaced stent; not sure when I got the last stent" (04/23/2017)  . DOPPLER ECHOCARDIOGRAPHY  2009  . INSERT / REPLACE / REMOVE PACEMAKER  06/2007  . JOINT REPLACEMENT    . LAPAROSCOPIC CHOLECYSTECTOMY  1994  . LEFT OOPHORECTOMY Left ~ 1989  . POSTERIOR LUMBAR FUSION  2000s - 04/2015 X 3   L3-4; L4-5; L2-3; Dr Trenton Gammon  . SKIN CANCER EXCISION Right    [place]  . TOTAL ABDOMINAL HYSTERECTOMY  1984   "uterus & right ovary"  . TOTAL KNEE ARTHROPLASTY Left 04/23/2017    Social History   Tobacco Use  Smoking Status Former Smoker  . Packs/day: 1.00  . Years: 5.00  . Pack years: 5.00  . Types: Cigarettes  . Start date: 05/11/1978  . Last attempt to quit: 07/03/1982  . Years since quitting: 34.8  Smokeless Tobacco Never Used    Social History   Substance and Sexual Activity  Alcohol Use Yes   Comment: 04/23/2017 "once q 6 months; glass of wine"    Family History  Problem Relation Age of Onset  . Suicidality Father        suicide death pt was 3 yrs  . Arrhythmia Mother   . Hypertension Mother   . Diabetes Mother   . Heart attack Brother 84  . Heart disease Paternal Aunt   . Prostate cancer Maternal Grandfather   . Diabetes Paternal Grandfather        fathers side of the family    Reviw of Systems:  Reviewed in the HPI.  All other systems are negative.  Physical Exam: Blood pressure 118/80, pulse 64, height 5' 6.5" (1.689 m), weight 244 lb 6.4 oz (110.9 kg), SpO2 92 %.  GEN:  Well nourished, well developed in no acute distress HEENT: Normal NECK: No JVD; No carotid bruits LYMPHATICS: No lymphadenopathy CARDIAC: RR,  no murmurs, rubs, gallops RESPIRATORY:  Clear to auscultation without rales, wheezing or rhonchi  ABDOMEN: Soft, non-tender, non-distended MUSCULOSKELETAL:  No edema; No deformity  SKIN: Warm and dry NEUROLOGIC:  Alert and oriented x 3    ECG: Nov. 6, 2018:  NSR at 64.  1st degree AV block   Assessment / Plan:  1. Acute on chronic diastolic CHF:     [name] has been having more more shortness of breath-especially with exertion.  Going for right and left heart cath  . We discussed risks, benefits, and options.  She understands and agrees to proceed.  2. Atrial fibrillation -    doing well, maintaining NSR .   Stopped amio on Oct. 25, 2018  2. Status post pacemaker implantation  3. Coronary artery disease-doing well , no angina  4. Hyperlipidemia -       5. Hypertension -BP is stable   6. Obstructive sleep apnea-currently on CPAP  7. Spinal disc disease.       Mertie Moores, MD  05/08/2017 3:15 PM    [facility] [address],  [place], [place]  [address] Pager [phone] Phone: [phone]; Fax: [phone]

## 2017-05-08 NOTE — Progress Notes (Signed)
[name] Date of Birth  04/05/1946 [facility] [address]    [place], [place]  [address] [phone]  Fax  [phone]   Problem list: 1. Atrial fibrillation - s/p ablation September 29, 2016 2. Status post pacemaker implantation 3. Coronary artery disease-status post PTCA and stenting of her right coronary artery 4. Hyperlipidemia 5. Hypertension 6. Obstructive sleep apnea-currently on CPAP 7.   History of Present Illness:  [name] is a 71 year old female with a history of atrial fibrillation. She status post pacemaker implantation. She also has a history of coronary artery disease and is status post PTCA and stenting of the right coronary artery. She also has a history of hyperlipidemia, hypertension, and sleep apnea.  She uses a CPAP mask at night.  The CPAP mask has really helped her stay in normal sinus rhythm.  She complains of having some episodes of chest tightness. This occurs at times. One episode was relieved with sublingual nitroglycerin. She states that it is not nearly as severe as her previous episodes of angina.  January 13, 21014: She was hospitalized in November with severe shortness breath and "double pneumonia". She is slowly recovering.  She's looking forward to  getting back into her exercise program.  She has not been exercising at all.  She's had some leg edema.  She denies any chest pain or shortness breath.  She's has mild leg swelling for the past several months. She does not add salt any of her food. She tries to stay away from salty foods and processed meat.  September 18, 2013:  She remains short of breath  - especialy with exertion  Echo in Jan. 2014:   - Left ventricle: The cavity size was normal. Wall thickness was increased in a pattern of mild LVH. Systolic function was normal. The estimated ejection fraction was in the range of 60% to 65%. - Pulmonary arteries: PA peak pressure: 33mm Hg   October 03, 2013:  Level was seen about  2 weeks ago with some increasing shortness of breath. She was noted to have moderate pulmonary hypertension on echo. She started on diltiazem. She has fairly well preserved left ventricle systolic function.  We started her on Cardizem XL 180 mg a day. She has not done well since that medication in addition. She's more short of breath. She's gained about 5 pounds.  She presents today for her echocardiogram. She is very short of breath and was worked into the schedule. Her O2 saturations at rest were 86%. We placed  oxygen on her and her O2 saturations increased above 90%. She is clearly more short of breath after the addition of the diltiazem.   She has not been eating any extra salt .  Has been taking her lasix.   She denies any chest pain - specifically was asked about pleuretic CP.  Had orthopnea until we placed the oxygen on her.   10/20/2013: [name] was admitted to the hospital with severe shortness breath when I saw her several weeks ago. She was found to be volume overload. She was aggressively diuresed and lost about 10 pounds of weight. Cardiac catheterization following that diuresis revealed only minimal coronary artery irregularities. Her left ventricular systolic function was normal. Her PA pressures were moderately elevated with an estimated PA pressure of around 57.   She was discharged on the same dose of Lasix that she was on at admission.   She is avoiding salt.    Dec. 3, 2015:  [name] is a 71 yo followed for chronic diastolic CHf and pulmonary hypertension ( est. PA pressure of 70)  [name] has been back in the hospital since I last saw her.  She has  Increased her lasix which has helped.  She does a pretty good job of avoiding salt.   Sept. 27, 2016:  Is doing well.  Has some dyspnea - she thinks likely due to weight gain . No cardiac symptoms .  Needs to have back surgery .   October 06, 2015:  Overall doing well. Had her back surgery in Oct.  Wt. 267 lbs today   Jan. 4,  2018:  [name] is seen back today following a recent hospitalization in Sept. 2017.   She was complaining of dyspnea , cough. She was cardioverted  She has diastolic dysfunction   She is now back in atrial fib. She new that she had converted back into a-fib.   HR are typically elevated when she is in atrial fib.   Has had some right flank pain .   Was found to have an atrophic right kidney.  Was in the hospital in [place], [place].   Has not tried a higher dose of Tikosyn.    Labs from her recent hospitalization on 06/24/2016 show a potassium level of 4.3. Her magnesium level was 2.0.  October 16, 2016:  Bradi is seen today  She has had an A-Fib ablation ( September 29, 2016) since I have seen her She is very shaky today - having trouble getting her breath while walking in today , her lasix was held following her ablation.    She restarted her Lasix 2 weeks ago .   She is feeling generally better since restarting the Lasix   11/21/2016: [name] was recently hospitalized with acute on chronic diastolic congestive heart failure. She had been traveling quite a bit and had been eating lots of salty food. She had also been holding various doses of Lasix set she was traveling. She was diuresed 7 lbs.  Feeling much better   She was supposed to decrease her amio to 200 mg daily but has continued on the 200 mg BID.  Nov. 6, 2018: [name] is seen today for further evaluation of her increasing shortness of breath.  Her amiodarone was stopped several weeks ago. Had left knee replacement 2 weeks ago .   Is doing well.  Is having some chest tightness . Is avoiding salt.    Current Outpatient Medications on File Prior to Visit  Medication Sig Dispense Refill  . acetaminophen (TYLENOL) 500 MG tablet Take 1,000 mg by mouth 2 (two) times daily.     [name] apixaban (ELIQUIS) 2.5 MG TABS tablet Take 1 tablet (2.5 mg total) by mouth 2 (two) times daily. 30 tablet 0  . Ascorbic Acid (VITAMIN C) 1000 MG tablet Take 1,000 mg by  mouth daily.    [name] atorvastatin (LIPITOR) 40 MG tablet TAKE 1 TABLET BY MOUTH EVERY DAY (Patient taking differently: TAKE 40mg  BY MOUTH EVERY DAY) 90 tablet 2  . Calcium Carbonate-Vitamin D (CALCIUM 600+D PO) Take 1 tablet by mouth daily.    . Cyanocobalamin (B-12) 5000 MCG CAPS Take 5,000 mcg by mouth at bedtime.     [name] diltiazem (CARDIZEM CD) 120 MG 24 hr capsule Take 1 capsule (120 mg total) by mouth daily. 30 capsule 11  . FLUoxetine (PROZAC) 20 MG capsule TAKE ONE CAPSULE BY MOUTH EVERY MORNING (Patient taking differently: TAKE 20mg  BY MOUTH EVERY MORNING)  90 capsule 1  . fluticasone (FLONASE) 50 MCG/ACT nasal spray Place 1 spray into both nostrils daily as needed for allergies or rhinitis (before bedtime).     . furosemide (LASIX) 80 MG tablet Take 1 tablet (80 mg total) by mouth 2 (two) times daily. (Patient taking differently: Take 80 mg by mouth 2 (two) times daily. Takes 0700 and 1500) 60 tablet 11  . KLOR-CON M20 20 MEQ tablet TAKE 1 TABLET BY MOUTH TWICE A DAY (Patient taking differently: TAKE 20MEq BY MOUTH TWICE A DAY) 180 tablet 3  . loratadine (CLARITIN) 10 MG tablet Take 1 tablet (10 mg total) by mouth daily. 30 tablet 5  . Lysine 1000 MG TABS Take 1,000 mg by mouth daily.    . metoprolol succinate (TOPROL-XL) 50 MG 24 hr tablet Take 1 tablet (50 mg total) by mouth 2 (two) times daily. Take with or immediately following a meal. (Patient taking differently: Take 50 mg by mouth 2 (two) times daily. Take with or immediately following a meal. Verified succinate twice daily) 60 tablet 11  . Multiple Vitamin (MULTIVITAMIN WITH MINERALS) TABS tablet Take 1 tablet by mouth daily. Centrum    . nitroGLYCERIN (NITROSTAT) 0.4 MG SL tablet Place 1 tablet (0.4 mg total) under the tongue every 5 (five) minutes x 3 doses as needed for chest pain. 25 tablet 6  . oxyCODONE-acetaminophen (ROXICET) 5-325 MG tablet Take 1 tablet by mouth every 4 (four) hours as needed. 60 tablet 0  . pantoprazole  (PROTONIX) 40 MG tablet Take 1 tablet (40 mg total) by mouth daily. 30 tablet 2  . Polyethyl Glycol-Propyl Glycol (SYSTANE) 0.4-0.3 % SOLN Place 1 drop into the right eye daily as needed (dry eyes).    [name] PRESCRIPTION MEDICATION Inhale into the lungs at bedtime. CPAP    . tiZANidine (ZANAFLEX) 2 MG tablet Take 1 tablet (2 mg total) by mouth every 6 (six) hours as needed for muscle spasms. 60 tablet 0  . vitamin E 1000 UNIT capsule Take 1,000 Units by mouth daily.     No current facility-administered medications on file prior to visit.     Allergies  Allergen Reactions  . Adhesive [Tape] Rash    Allergic to EKG stickers and defibrillation pads.    Past Medical History:  Diagnosis Date  . Anticoagulant long-term use    pradaxa  . Anxiety   . Arthritis    "fingers, lower back" (04/23/2017   . CAD (coronary artery disease) 3716,9678   post PTCA with bare-metal stenting to mid RCA in December 2004     . CHF (congestive heart failure) ([place])   . Chronic atrial fibrillation ([place]) 06/2007   Tachybradycardia pacemaker  . Chronic kidney disease    10% function - ?R, other kidney is compensating    . CVA (cerebral vascular accident) [facility]) [phone]   denies residual on 04/23/2017  . Depression   . Diplopia 06/19/2008   Qualifier: Diagnosis of  By: [name] MD, [name]   . Dysrhythmia    ATRIAL FIBRILATION  . Edema of lower extremity   . Hyperlipidemia   . Hypertension   . Inferior myocardial infarction [facility])    acute inferior wall mi/other medical hx  . Myocardial infarction ([place]) [id number]  . OSA on CPAP    last test- 2010  . Pacemaker   . Pneumonia 2014   tx. ----  [facility]  . Pulmonary hypertension ([place])    moderate pulmonary hypertension by 10/2016 echo and 10/2013 cardiac  cath  . Shortness of breath   . Skin cancer    "cut off right [place]; burned off LLE" (04/23/2017)  . Spondylolisthesis   . TIA (transient ischemic attack) 2008  . Unspecified hemorrhoids without  mention of complication 7/82/4235   Colonoscopy--Dr. Carlean Purl     Past Surgical History:  Procedure Laterality Date  . APPENDECTOMY  1984  . BACK SURGERY    . CATARACT EXTRACTION W/ INTRAOCULAR LENS  IMPLANT, BILATERAL Bilateral 01/15/2017- 03/2017  . CORONARY ANGIOPLASTY  X 2  . CORONARY ANGIOPLASTY WITH STENT PLACEMENT  1998; ~ 2007; ?date   "1 stent; replaced stent; not sure when I got the last stent" (04/23/2017)  . DOPPLER ECHOCARDIOGRAPHY  2009  . INSERT / REPLACE / REMOVE PACEMAKER  06/2007  . JOINT REPLACEMENT    . LAPAROSCOPIC CHOLECYSTECTOMY  1994  . LEFT OOPHORECTOMY Left ~ 1989  . POSTERIOR LUMBAR FUSION  2000s - 04/2015 X 3   L3-4; L4-5; L2-3; Dr Trenton Gammon  . SKIN CANCER EXCISION Right    [place]  . TOTAL ABDOMINAL HYSTERECTOMY  1984   "uterus & right ovary"  . TOTAL KNEE ARTHROPLASTY Left 04/23/2017    Social History   Tobacco Use  Smoking Status Former Smoker  . Packs/day: 1.00  . Years: 5.00  . Pack years: 5.00  . Types: Cigarettes  . Start date: 05/11/1978  . Last attempt to quit: 07/03/1982  . Years since quitting: 34.8  Smokeless Tobacco Never Used    Social History   Substance and Sexual Activity  Alcohol Use Yes   Comment: 04/23/2017 "once q 6 months; glass of wine"    Family History  Problem Relation Age of Onset  . Suicidality Father        suicide death pt was 3 yrs  . Arrhythmia Mother   . Hypertension Mother   . Diabetes Mother   . Heart attack Brother 69  . Heart disease Paternal Aunt   . Prostate cancer Maternal Grandfather   . Diabetes Paternal Grandfather        fathers side of the family    Reviw of Systems:  Reviewed in the HPI.  All other systems are negative.  Physical Exam: Blood pressure 118/80, pulse 64, height 5' 6.5" (1.689 m), weight 244 lb 6.4 oz (110.9 kg), SpO2 92 %.  GEN:  Well nourished, well developed in no acute distress HEENT: Normal NECK: No JVD; No carotid bruits LYMPHATICS: No lymphadenopathy CARDIAC: RR,  no murmurs, rubs, gallops RESPIRATORY:  Clear to auscultation without rales, wheezing or rhonchi  ABDOMEN: Soft, non-tender, non-distended MUSCULOSKELETAL:  No edema; No deformity  SKIN: Warm and dry NEUROLOGIC:  Alert and oriented x 3    ECG: Nov. 6, 2018:  NSR at 64.  1st degree AV block   Assessment / Plan:  1. Acute on chronic diastolic CHF:     Jezabel has been having more more shortness of breath-especially with exertion.  Going for right and left heart cath  . We discussed risks, benefits, and options.  She understands and agrees to proceed.  2. Atrial fibrillation -    doing well, maintaining NSR .   Stopped amio on Oct. 25, 2018  2. Status post pacemaker implantation  3. Coronary artery disease-doing well , no angina  4. Hyperlipidemia -       5. Hypertension -BP is stable   6. Obstructive sleep apnea-currently on CPAP  7. Spinal disc disease.       Mertie Moores, MD  05/08/2017 3:15 PM    [facility] [address]  [place], [place]  [address] Pager [phone] Phone: [phone]; Fax: [phone]

## 2017-05-08 NOTE — Patient Instructions (Addendum)
Medication Instructions:  Your physician recommends that you continue on your current medications as directed. Please refer to the Current Medication list given to you today.   Labwork: TODAY - Pt/INR, CBC, BMET   Testing/Procedures: None Ordered   Follow-Up: Your physician recommends that you schedule a follow-up appointment in: December 12 at 10:00 am   If you need a refill on your cardiac medications before your next appointment, please call your pharmacy.   Thank you for choosing [facility]! Christen Bame, RN [phone]

## 2017-05-09 ENCOUNTER — Encounter (HOSPITAL_COMMUNITY)
Admission: RE | Disposition: A | Discharge status: Home / Self Care | Payer: Self-pay | Source: Ambulatory Visit | Attending: Cardiovascular Disease

## 2017-05-09 ENCOUNTER — Ambulatory Visit (HOSPITAL_COMMUNITY)
Admission: RE | Admit: 2017-05-09 | Discharge: 2017-05-09 | Disposition: A | Discharge status: Home / Self Care | Payer: PPO | Source: Ambulatory Visit | Attending: Cardiovascular Disease | Admitting: Cardiovascular Disease

## 2017-05-09 DIAGNOSIS — I252 Old myocardial infarction: Secondary | ICD-10-CM | POA: Insufficient documentation

## 2017-05-09 DIAGNOSIS — I130 Hypertensive heart and chronic kidney disease with heart failure and stage 1 through stage 4 chronic kidney disease, or unspecified chronic kidney disease: Secondary | ICD-10-CM | POA: Insufficient documentation

## 2017-05-09 DIAGNOSIS — F419 Anxiety disorder, unspecified: Secondary | ICD-10-CM | POA: Insufficient documentation

## 2017-05-09 DIAGNOSIS — E785 Hyperlipidemia, unspecified: Secondary | ICD-10-CM | POA: Insufficient documentation

## 2017-05-09 DIAGNOSIS — Z6838 Body mass index (BMI) 38.0-38.9, adult: Secondary | ICD-10-CM | POA: Insufficient documentation

## 2017-05-09 DIAGNOSIS — Z8673 Personal history of transient ischemic attack (TIA), and cerebral infarction without residual deficits: Secondary | ICD-10-CM | POA: Insufficient documentation

## 2017-05-09 DIAGNOSIS — I2510 Atherosclerotic heart disease of native coronary artery without angina pectoris: Secondary | ICD-10-CM | POA: Diagnosis not present

## 2017-05-09 DIAGNOSIS — F329 Major depressive disorder, single episode, unspecified: Secondary | ICD-10-CM | POA: Diagnosis not present

## 2017-05-09 DIAGNOSIS — M19049 Primary osteoarthritis, unspecified hand: Secondary | ICD-10-CM | POA: Insufficient documentation

## 2017-05-09 DIAGNOSIS — G4733 Obstructive sleep apnea (adult) (pediatric): Secondary | ICD-10-CM | POA: Diagnosis not present

## 2017-05-09 DIAGNOSIS — Z87891 Personal history of nicotine dependence: Secondary | ICD-10-CM | POA: Insufficient documentation

## 2017-05-09 DIAGNOSIS — E669 Obesity, unspecified: Secondary | ICD-10-CM | POA: Diagnosis not present

## 2017-05-09 DIAGNOSIS — I5032 Chronic diastolic (congestive) heart failure: Secondary | ICD-10-CM | POA: Insufficient documentation

## 2017-05-09 DIAGNOSIS — N189 Chronic kidney disease, unspecified: Secondary | ICD-10-CM | POA: Diagnosis not present

## 2017-05-09 DIAGNOSIS — I272 Pulmonary hypertension, unspecified: Secondary | ICD-10-CM | POA: Insufficient documentation

## 2017-05-09 DIAGNOSIS — I2582 Chronic total occlusion of coronary artery: Secondary | ICD-10-CM | POA: Diagnosis not present

## 2017-05-09 DIAGNOSIS — Z950 Presence of cardiac pacemaker: Secondary | ICD-10-CM | POA: Insufficient documentation

## 2017-05-09 DIAGNOSIS — Z7901 Long term (current) use of anticoagulants: Secondary | ICD-10-CM | POA: Diagnosis not present

## 2017-05-09 DIAGNOSIS — Z955 Presence of coronary angioplasty implant and graft: Secondary | ICD-10-CM | POA: Diagnosis not present

## 2017-05-09 HISTORY — PX: RIGHT/LEFT HEART CATH AND CORONARY ANGIOGRAPHY: CATH118266

## 2017-05-09 HISTORY — PX: ULTRASOUND GUIDANCE FOR VASCULAR ACCESS: SHX6516

## 2017-05-09 LAB — POCT I-STAT 3, ART BLOOD GAS (G3+)
Acid-Base Excess: 3 mmol/L — ABNORMAL HIGH (ref 0.0–2.0)
Bicarbonate: 28.8 mmol/L — ABNORMAL HIGH (ref 20.0–28.0)
O2 Saturation: 93 %
TCO2: 30 mmol/L (ref 22–32)
pCO2 arterial: 45.5 mmHg (ref 32.0–48.0)
pH, Arterial: 7.409 (ref 7.350–7.450)
pO2, Arterial: 66 mmHg — ABNORMAL LOW (ref 83.0–108.0)

## 2017-05-09 LAB — POCT I-STAT 3, VENOUS BLOOD GAS (G3P V)
Acid-Base Excess: 2 mmol/L (ref 0.0–2.0)
Bicarbonate: 27.9 mmol/L (ref 20.0–28.0)
O2 Saturation: 74 %
TCO2: 29 mmol/L (ref 22–32)
pCO2, Ven: 47 mmHg (ref 44.0–60.0)
pH, Ven: 7.381 (ref 7.250–7.430)
pO2, Ven: 41 mmHg (ref 32.0–45.0)

## 2017-05-09 LAB — POCT ACTIVATED CLOTTING TIME: Activated Clotting Time: 158 seconds

## 2017-05-09 SURGERY — RIGHT/LEFT HEART CATH AND CORONARY ANGIOGRAPHY
Anesthesia: LOCAL

## 2017-05-09 MED ORDER — VERAPAMIL HCL 2.5 MG/ML IV SOLN
INTRAVENOUS | Status: AC
  Filled 2017-05-09: qty 2

## 2017-05-09 MED ORDER — SODIUM CHLORIDE 0.9 % WEIGHT BASED INFUSION: 1.0000 mL/kg/h | INTRAVENOUS | Status: DC

## 2017-05-09 MED ORDER — IOPAMIDOL (ISOVUE-370) INJECTION 76%
INTRAVENOUS | Status: AC
  Filled 2017-05-09: qty 100

## 2017-05-09 MED ORDER — HEPARIN (PORCINE) IN NACL 2-0.9 UNIT/ML-% IJ SOLN
INTRAMUSCULAR | Status: AC
  Filled 2017-05-09: qty 500

## 2017-05-09 MED ORDER — APIXABAN 2.5 MG PO TABS: 2.5000 mg | ORAL_TABLET | Freq: Two times a day (BID) | ORAL | 0 refills | Status: DC

## 2017-05-09 MED ORDER — SODIUM CHLORIDE 0.9 % WEIGHT BASED INFUSION
3.0000 mL/kg/h | INTRAVENOUS | Status: AC
  Administered 2017-05-09: 3 mL/kg/h via INTRAVENOUS

## 2017-05-09 MED ORDER — HEPARIN (PORCINE) IN NACL 2-0.9 UNIT/ML-% IJ SOLN
INTRAMUSCULAR | Status: AC | PRN
  Administered 2017-05-09: 1000 mL

## 2017-05-09 MED ORDER — MIDAZOLAM HCL 2 MG/2ML IJ SOLN
INTRAMUSCULAR | Status: DC | PRN
Start: 2017-05-09 — End: 2017-05-09
  Administered 2017-05-09: 2 mg via INTRAVENOUS

## 2017-05-09 MED ORDER — MIDAZOLAM HCL 2 MG/2ML IJ SOLN
INTRAMUSCULAR | Status: AC
  Filled 2017-05-09: qty 2

## 2017-05-09 MED ORDER — ASPIRIN 81 MG PO CHEW
CHEWABLE_TABLET | ORAL | Status: AC
  Filled 2017-05-09: qty 1

## 2017-05-09 MED ORDER — SODIUM CHLORIDE 0.9% FLUSH: 3.0000 mL | Freq: Two times a day (BID) | INTRAVENOUS | Status: DC

## 2017-05-09 MED ORDER — HEPARIN SODIUM (PORCINE) 1000 UNIT/ML IJ SOLN
INTRAMUSCULAR | Status: AC
  Filled 2017-05-09: qty 1

## 2017-05-09 MED ORDER — LIDOCAINE HCL (PF) 1 % IJ SOLN
INTRAMUSCULAR | Status: DC | PRN
  Administered 2017-05-09: 2 mL
  Administered 2017-05-09: 20 mL

## 2017-05-09 MED ORDER — SODIUM CHLORIDE 0.9% FLUSH: 3.0000 mL | INTRAVENOUS | Status: DC | PRN

## 2017-05-09 MED ORDER — LIDOCAINE HCL (PF) 1 % IJ SOLN
INTRAMUSCULAR | Status: AC
  Filled 2017-05-09: qty 30

## 2017-05-09 MED ORDER — IOPAMIDOL (ISOVUE-370) INJECTION 76%
INTRAVENOUS | Status: DC | PRN
Start: 2017-05-09 — End: 2017-05-09
  Administered 2017-05-09: 85 mL via INTRA_ARTERIAL

## 2017-05-09 MED ORDER — ASPIRIN 81 MG PO CHEW
81.0000 mg | CHEWABLE_TABLET | ORAL | Status: AC
  Administered 2017-05-09: 81 mg via ORAL

## 2017-05-09 MED ORDER — FENTANYL CITRATE (PF) 100 MCG/2ML IJ SOLN
INTRAMUSCULAR | Status: AC
  Filled 2017-05-09: qty 2

## 2017-05-09 MED ORDER — SODIUM CHLORIDE 0.9 % IV SOLN: 250.0000 mL | INTRAVENOUS | Status: DC | PRN

## 2017-05-09 MED ORDER — FENTANYL CITRATE (PF) 100 MCG/2ML IJ SOLN
INTRAMUSCULAR | Status: DC | PRN
  Administered 2017-05-09: 25 ug via INTRAVENOUS

## 2017-05-09 MED ORDER — VERAPAMIL HCL 2.5 MG/ML IV SOLN
INTRAVENOUS | Status: DC | PRN
  Administered 2017-05-09: 10 mL via INTRA_ARTERIAL

## 2017-05-09 MED ORDER — HEPARIN SODIUM (PORCINE) 1000 UNIT/ML IJ SOLN
INTRAMUSCULAR | Status: DC | PRN
  Administered 2017-05-09: 4000 [IU] via INTRAVENOUS

## 2017-05-09 SURGICAL SUPPLY — 14 items
CATH 5FR JL3.5 JR4 ANG PIG MP (CATHETERS) ×1 IMPLANT
CATH INFINITI 5 FR MPA2 (CATHETERS) ×1 IMPLANT
CATH SWAN GANZ 7F STRAIGHT (CATHETERS) ×1 IMPLANT
COVER PRB 48X5XTLSCP FOLD TPE (BAG) IMPLANT
COVER PROBE 5X48 (BAG) ×3
DEVICE RAD COMP TR BAND LRG (VASCULAR PRODUCTS) ×1 IMPLANT
GLIDESHEATH SLEND SS 6F .021 (SHEATH) ×1 IMPLANT
GUIDEWIRE INQWIRE 1.5J.035X260 (WIRE) IMPLANT
INQWIRE 1.5J .035X260CM (WIRE) ×3
KIT HEART LEFT (KITS) ×3 IMPLANT
PACK CARDIAC CATHETERIZATION (CUSTOM PROCEDURE TRAY) ×3 IMPLANT
SHEATH PINNACLE 7F 10CM (SHEATH) ×1 IMPLANT
TRANSDUCER W/STOPCOCK (MISCELLANEOUS) ×3 IMPLANT
TUBING CIL FLEX 10 FLL-RA (TUBING) ×3 IMPLANT

## 2017-05-09 NOTE — Progress Notes (Signed)
Site area: rt groin fv sheath Site Prior to Removal:  Level 0 Pressure Applied For:  10 minutes Manual:   yes Patient Status During Pull:  stable Post Pull Site:  Level  0 Post Pull Instructions Given:  yes Post Pull Pulses Present: palpable Dressing Applied:   Gauze and tegaderm Bedrest begins @ 1225 Comments:

## 2017-05-09 NOTE — Interval H&P Note (Signed)
History and Physical Interval Note:  05/09/2017 10:21 AM  [name]  has presented today for surgery, with the diagnosis of cad, pulmonary hypertension  The various methods of treatment have been discussed with the patient and family. After consideration of risks, benefits and other options for treatment, the patient has consented to  Procedure(s): RIGHT/LEFT HEART CATH AND CORONARY ANGIOGRAPHY (N/A) as a surgical intervention .  The patient's history has been reviewed, patient examined, no change in status, stable for surgery.  I have reviewed the patient's chart and labs.  Questions were answered to the patient's satisfaction.     Sherren Mocha

## 2017-05-09 NOTE — Discharge Instructions (Signed)

## 2017-05-10 ENCOUNTER — Encounter (HOSPITAL_COMMUNITY): Payer: Self-pay | Admitting: Cardiovascular Disease

## 2017-05-11 ENCOUNTER — Telehealth: Payer: Self-pay | Admitting: Nurse Practitioner

## 2017-05-11 DIAGNOSIS — R9430 Abnormal result of cardiovascular function study, unspecified: Secondary | ICD-10-CM

## 2017-05-11 DIAGNOSIS — I272 Pulmonary hypertension, unspecified: Secondary | ICD-10-CM

## 2017-05-11 MED ORDER — TORSEMIDE 20 MG PO TABS: 40.0000 mg | ORAL_TABLET | Freq: Two times a day (BID) | ORAL | 11 refills | Status: DC

## 2017-05-11 NOTE — Telephone Encounter (Signed)
Great thoughts from [place].  1.  LVEPD is elevated.   She is on Lasix 80 BID but this does not seem to be diuresing her enouth Lets try Torsemide 40 mg BID .   Continue current potassium dosing .   Check BMP in 2 weeks If she does not get a significant increase in diuresis with the Torsemide, we should consider adding low dose metolazone ( 2.5 mg every 3-4 days ) will have to follow electrolytes closley if we add metolazone .  2. It is possible that she has Pulmonary vein stenosis from her a-fib ablation ( pulmonary vein isolation )  Please get a Coronary CT angiogram with a note to evaluate the pulmonary veins .   3. Make sure she is avoiding salt.   Continue weight loss efforts

## 2017-05-11 NOTE — Telephone Encounter (Signed)
Reviewed Dr. Elmarie Shiley instructions with patient. I advised her that someone from our office will contact her to schedule the CT. She is currently scheduled for BMET on 11/20 for the change from furosemide to torsemide.  She will need one within 14 days of CT so I advised we will adjust the date if needed once the CT is scheduled.  She verbalized understanding and agreement with plan and thanked me for the call.    Please arrive at the [facility] main entrance of [facility] at xx:xx AM (30-45 minutes prior to test start time)  [facility] [address] [place], [place] [address] [phone]  Proceed to the [facility] Radiology Department (First Floor).  Please follow these instructions carefully (unless otherwise directed):  Hold all erectile dysfunction medications at least 48 hours prior to test.  On the Night Before the Test: . Drink plenty of water. . Do not consume any caffeinated/decaffeinated beverages or chocolate 12 hours prior to your test. . Do not take any antihistamines 12 hours prior to your test. . If you take Metformin do not take 24 hours prior to test. . If the patient has contrast allergy: ? Patient will need a prescription for Prednisone and very clear instructions (as follows): 1. Prednisone 50 mg - take 13 hours prior to test 2. Take another Prednisone 50 mg 7 hours prior to test 3. Take another Prednisone 50 mg 1 hour prior to test 4. Take Benadryl 50 mg 1 hour prior to test . Patient must complete all four doses of above prophylactic medications. . Patient will need a ride after test due to Benadryl.  On the Day of the Test: . Drink plenty of water. Do not drink any water within one hour of the test. . Do not eat any food 4 hours prior to the test. . You may take your regular medications prior to the test. . IF NOT ON A BETA BLOCKER - Take 50 mg of lopressor (metoprolol) one hour before the test. . HOLD Furosemide morning of the  test.  After the Test: . Drink plenty of water. . After receiving IV contrast, you may experience a mild flushed feeling. This is normal. . On occasion, you may experience a mild rash up to 24 hours after the test. This is not dangerous. If this occurs, you can take Benadryl 25 mg and increase your fluid intake. . If you experience trouble breathing, this can be serious. If it is severe call 911 IMMEDIATELY. If it is mild, please call our office. . If you take any of these medications: Glipizide/Metformin, Avandament, Glucavance, please do not take 48 hours after completing test.

## 2017-05-11 NOTE — Telephone Encounter (Signed)
-----   Message from Sherren Mocha, MD sent at 05/09/2017  2:22 PM EST ----- Abbe Amsterdam - see note. Maybe a red herring with the discrepancy btwn wedge and LVEDP, just a thought. She has pretty bad pulmonary HTN.  Ronalee Belts

## 2017-05-11 NOTE — Telephone Encounter (Signed)
Spoke with patient who called back to request that CT not be scheduled for the week of Thanksgiving. I advised that I will forward that message to schedulers. She thanked me for the call.

## 2017-05-11 NOTE — Telephone Encounter (Signed)
Follow up     Patient returning call. Please call

## 2017-05-14 NOTE — Progress Notes (Signed)
Chief Complaint  Patient presents with  . Follow-up    Pt has lost 16lb since last OV with dietary changes and acitvity changes. Pt O2 sats low today 88%, recovered to 91% with pursed lip breathing. Pt states that Pulmonary is working on her SOB. Pt states that this is improving some but she has further testing to evaluate.    HPI: [name] 71 y.o. come in for Chronic disease management  And fu  Hospital she had a left knee replacement and then a cardiac cath for pulmonary hypertension heart arrhythmia respiratory symptoms is taken off of amiodarone and diuretic has been changed. She has lost 16 pounds in a healthy manner by avoiding sweets breads and simple carbohydrates.  Thinks her blood sugars will be good and she is on no medicine for this.  Has follow-up with cardiology and pulmonology in the near future. No new change in vision numbness in feet. ROS: See pertinent positives and negatives per HPI. Off amiodarone  For 3 weeks  Has fu pulm and cards  Past Medical History:  Diagnosis Date  . Anticoagulant long-term use    pradaxa  . Anxiety   . Arthritis    "fingers, lower back" (04/23/2017   . CAD (coronary artery disease) 5374,8270   post PTCA with bare-metal stenting to mid RCA in December 2004     . CHF (congestive heart failure) ([place])   . Chronic atrial fibrillation ([place]) 06/2007   Tachybradycardia pacemaker  . Chronic kidney disease    10% function - ?R, other kidney is compensating    . CVA (cerebral vascular accident) [facility]) [phone]   denies residual on 04/23/2017  . Depression   . Diplopia 06/19/2008   Qualifier: Diagnosis of  By: [name] MD, [name]   . Dysrhythmia    ATRIAL FIBRILATION  . Edema of lower extremity   . Hyperlipidemia   . Hypertension   . Inferior myocardial infarction [facility])    acute inferior wall mi/other medical hx  . Myocardial infarction ([place]) [id number]  . OSA on CPAP    last test- 2010  . Pacemaker   . Pneumonia 2014   tx. ----   [facility]  . Pulmonary hypertension ([place])    moderate pulmonary hypertension by 10/2016 echo and 10/2013 cardiac cath  . Shortness of breath   . Skin cancer    "cut off right [place]; burned off LLE" (04/23/2017)  . Spondylolisthesis   . TIA (transient ischemic attack) 2008  . Unspecified hemorrhoids without mention of complication 08/03/69   Colonoscopy--Dr. Carlean Purl     Family History  Problem Relation Age of Onset  . Suicidality Father        suicide death pt was 3 yrs  . Arrhythmia Mother   . Hypertension Mother   . Diabetes Mother   . Heart attack Brother 79  . Heart disease Paternal Aunt   . Prostate cancer Maternal Grandfather   . Diabetes Paternal Grandfather        fathers side of the family    Social History   Socioeconomic History  . Marital status: Married    Spouse name: None  . Number of children: 0  . Years of education: HS  . Highest education level: None  Social Needs  . Financial resource strain: None  . Food insecurity - worry: None  . Food insecurity - inability: None  . Transportation needs - medical: None  . Transportation needs - non-medical: None  Occupational History  .  Occupation: retired    Comment: previously worked [employer]  . Smoking status: Former Smoker    Packs/day: 1.00    Years: 5.00    Pack years: 5.00    Types: Cigarettes    Start date: 05/11/1978    Last attempt to quit: 07/03/1982    Years since quitting: 34.8  . Smokeless tobacco: Never Used  Substance and Sexual Activity  . Alcohol use: Yes    Comment: 04/23/2017 "once q 6 months; glass of wine"  . Drug use: No  . Sexual activity: Not Currently  Other Topics Concern  . 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   Married    Originally from [place] of two, high school education   Former smoker [phone] 1ppd   Hunting dogs 7    Retired from [employer] 2       Outpatient Medications Prior to Visit  Medication Sig Dispense Refill  . acetaminophen (TYLENOL) 500 MG tablet Take 1,000 mg by mouth 2 (two) times daily.     [name] apixaban (ELIQUIS) 2.5 MG TABS tablet Take 1 tablet (2.5 mg total) 2 (two) times daily by mouth. 30 tablet 0  . Ascorbic Acid (VITAMIN C) 1000 MG tablet Take 1,000 mg by mouth daily.    [name] atorvastatin (LIPITOR) 40 MG tablet TAKE 1 TABLET BY MOUTH EVERY DAY (Patient taking differently: TAKE 40mg  BY MOUTH EVERY DAY) 90 tablet 2  . Calcium Carbonate-Vitamin D (CALCIUM 600+D PO) Take 1 tablet by mouth daily.    . Cyanocobalamin (B-12) 5000 MCG CAPS Take 5,000 mcg by mouth at bedtime.     [name] diltiazem (CARDIZEM CD) 120 MG 24 hr capsule Take 1 capsule (120 mg total) by mouth daily. 30 capsule 11  . FLUoxetine (PROZAC) 20 MG capsule TAKE ONE CAPSULE BY MOUTH EVERY MORNING (Patient taking differently: TAKE 20mg  BY MOUTH EVERY MORNING) 90 capsule 1  . fluticasone (FLONASE) 50 MCG/ACT nasal spray Place 1 spray into both nostrils daily as needed for allergies or rhinitis (before bedtime).     [name] KLOR-CON M20 20 MEQ tablet TAKE 1 TABLET BY MOUTH TWICE A DAY (Patient taking differently: TAKE 20MEq BY MOUTH TWICE A DAY) 180 tablet 3  . loratadine (CLARITIN) 10 MG tablet Take 1 tablet (10 mg total) by mouth daily. 30 tablet 5  . Lysine 1000 MG TABS Take 1,000 mg by mouth daily.    . metoprolol succinate (TOPROL-XL) 50 MG 24 hr tablet Take 1 tablet (50 mg total) by mouth 2 (two) times daily. Take with or immediately following a meal. (Patient taking differently: Take 50 mg by mouth 2 (two) times daily. Take with or immediately following a meal. Verified succinate twice daily) 60 tablet 11  . Multiple Vitamin (MULTIVITAMIN WITH MINERALS) TABS tablet Take 1 tablet by mouth daily. Centrum    . nitroGLYCERIN (NITROSTAT) 0.4 MG SL tablet Place 1 tablet (0.4 mg total) under the tongue every 5 (five) minutes x 3 doses as needed for chest pain. 25 tablet 6  .  oxyCODONE-acetaminophen (ROXICET) 5-325 MG tablet Take 1 tablet by mouth every 4 (four) hours as needed. 60 tablet 0  . pantoprazole (PROTONIX) 40 MG tablet Take 1 tablet (40 mg total) by mouth daily. 30 tablet 2  . Polyethyl Glycol-Propyl Glycol (SYSTANE) 0.4-0.3 % SOLN Place 1 drop into the right eye daily as needed (dry  eyes).    . PRESCRIPTION MEDICATION Inhale into the lungs at bedtime. CPAP    . tiZANidine (ZANAFLEX) 2 MG tablet Take 1 tablet (2 mg total) by mouth every 6 (six) hours as needed for muscle spasms. 60 tablet 0  . torsemide (DEMADEX) 20 MG tablet Take 2 tablets (40 mg total) 2 (two) times daily by mouth. 120 tablet 11  . vitamin E 1000 UNIT capsule Take 1,000 Units by mouth daily.     No facility-administered medications prior to visit.      EXAM:  BP (!) 134/58 (BP Location: Right Arm, Patient Position: Sitting, Cuff Size: Large)   Pulse 61   Temp 98.6 F (37 C) (Oral)   Ht 5' 6.5" (1.689 m)   Wt 240 lb 3.2 oz (109 kg)   SpO2 91%   BMI 38.19 kg/m   Body mass index is 38.19 kg/m.  GENERAL: vitals reviewed and listed above, alert, oriented, appears well hydrated and in no acute distress HEENT: atraumatic, conjunctiva  clear, no obvious abnormalities on inspection of external nose and ears OP : no lesion edema or exudate  NECK: no obvious masses on inspection palpation  LUNGS: Decreased breath sounds throughout with an occasional wheeze Abdomen soft without organomegaly guarding or rebound.  No fluid wave obvious  CV: HRRR, no clubbing cyanosis ornl cap refill  MS: moves all extremities without noticeable focal  abnormality PSYCH: pleasant and cooperative, no obvious depression or anxiety Diabetic Foot Exam - Simple   Simple Foot Form Diabetic Foot exam was performed with the following findings:  Yes 05/15/2017 11:19 AM  Visual Inspection No deformities, no ulcerations, no other skin breakdown bilaterally:  Yes Sensation Testing Intact to touch and  monofilament testing bilaterally:  Yes Pulse Check Posterior Tibialis and Dorsalis pulse intact bilaterally:  Yes Comments     Lab Results  Component Value Date   WBC 14.3 (H) 05/08/2017   HGB 13.3 05/08/2017   HCT 39.0 05/08/2017   PLT 338 05/08/2017   GLUCOSE 102 (H) 05/08/2017   CHOL 116 10/16/2016   TRIG 139 10/16/2016   HDL 42 10/16/2016   LDLCALC 46 10/16/2016   ALT 27 01/23/2017   AST 19 01/23/2017   NA 138 05/08/2017   K 5.3 (H) 05/08/2017   CL 97 05/08/2017   CREATININE 1.08 (H) 05/08/2017   BUN 27 05/08/2017   CO2 30 (H) 05/08/2017   TSH 4.390 10/16/2016   INR 1.0 05/08/2017   HGBA1C 6.3 05/15/2017   MICROALBUR <0.7 01/23/2017   BP Readings from Last 3 Encounters:  05/15/17 (!) 134/58  05/09/17 (!) 122/49  05/08/17 118/80   Wt Readings from Last 3 Encounters:  05/15/17 240 lb 3.2 oz (109 kg)  05/09/17 242 lb (109.8 kg)  05/08/17 244 lb 6.4 oz (110.9 kg)     ASSESSMENT AND PLAN:  Discussed the following assessment and plan:  Pre-diabetes early DM  - Plan: POC HgB A1c  Medication management - Plan: POC HgB A1c  Essential hypertension  Chronic diastolic heart failure- grade 2 diastolic dysfunction by echo 10/03/13  BMI 38.0-38.9,adult  Anticoagulant long-term use continue  Healthy weight loss  On going   pulm Cv  Problems  a1c doing well will follow   4 mos  Or latest 6 mos OV    Total visit 1mins > 50% spent counseling and coordinating care as indicated in above note and in instructions to patient .   -Patient advised to return or notify health care team  if  new concerns arise.  Patient Instructions  Continue lifestyle intervention healthy eating and exercise .  a1c    Is  Good .  Plan rov in 4-6 months    [name]. Shaddai Shapley M.D.

## 2017-05-15 ENCOUNTER — Encounter: Payer: Self-pay | Admitting: Internal Medicine

## 2017-05-15 ENCOUNTER — Ambulatory Visit: Payer: PPO | Admitting: Internal Medicine

## 2017-05-15 VITALS — BP 134/58 | HR 61 | Temp 98.6°F | Ht 66.5 in | Wt 240.2 lb

## 2017-05-15 DIAGNOSIS — Z6838 Body mass index (BMI) 38.0-38.9, adult: Secondary | ICD-10-CM

## 2017-05-15 DIAGNOSIS — Z79899 Other long term (current) drug therapy: Secondary | ICD-10-CM | POA: Diagnosis not present

## 2017-05-15 DIAGNOSIS — I10 Essential (primary) hypertension: Secondary | ICD-10-CM

## 2017-05-15 DIAGNOSIS — Z7901 Long term (current) use of anticoagulants: Secondary | ICD-10-CM | POA: Diagnosis not present

## 2017-05-15 DIAGNOSIS — R7303 Prediabetes: Secondary | ICD-10-CM

## 2017-05-15 DIAGNOSIS — I5032 Chronic diastolic (congestive) heart failure: Secondary | ICD-10-CM | POA: Diagnosis not present

## 2017-05-15 LAB — POCT GLYCOSYLATED HEMOGLOBIN (HGB A1C): Hemoglobin A1C: 6.3

## 2017-05-15 NOTE — Patient Instructions (Addendum)
Continue lifestyle intervention healthy eating and exercise .  a1c    Is  Good .  Plan rov in 4-6 months

## 2017-05-16 DIAGNOSIS — Z96652 Presence of left artificial knee joint: Secondary | ICD-10-CM | POA: Diagnosis not present

## 2017-05-16 DIAGNOSIS — M25662 Stiffness of left knee, not elsewhere classified: Secondary | ICD-10-CM | POA: Diagnosis not present

## 2017-05-16 DIAGNOSIS — R262 Difficulty in walking, not elsewhere classified: Secondary | ICD-10-CM | POA: Diagnosis not present

## 2017-05-16 DIAGNOSIS — M25562 Pain in left knee: Secondary | ICD-10-CM | POA: Diagnosis not present

## 2017-05-17 DIAGNOSIS — Z96652 Presence of left artificial knee joint: Secondary | ICD-10-CM | POA: Diagnosis not present

## 2017-05-17 DIAGNOSIS — M25562 Pain in left knee: Secondary | ICD-10-CM | POA: Diagnosis not present

## 2017-05-17 DIAGNOSIS — M25662 Stiffness of left knee, not elsewhere classified: Secondary | ICD-10-CM | POA: Diagnosis not present

## 2017-05-19 ENCOUNTER — Other Ambulatory Visit: Payer: Self-pay | Admitting: Internal Medicine

## 2017-05-22 ENCOUNTER — Other Ambulatory Visit: Payer: PPO

## 2017-05-27 DIAGNOSIS — G4733 Obstructive sleep apnea (adult) (pediatric): Secondary | ICD-10-CM | POA: Diagnosis not present

## 2017-05-27 DIAGNOSIS — M1712 Unilateral primary osteoarthritis, left knee: Secondary | ICD-10-CM | POA: Diagnosis not present

## 2017-05-27 DIAGNOSIS — I5040 Unspecified combined systolic (congestive) and diastolic (congestive) heart failure: Secondary | ICD-10-CM | POA: Diagnosis not present

## 2017-05-29 ENCOUNTER — Other Ambulatory Visit: Payer: PPO

## 2017-05-29 ENCOUNTER — Encounter: Payer: Self-pay | Admitting: Internal Medicine

## 2017-05-29 ENCOUNTER — Ambulatory Visit: Payer: PPO | Admitting: Internal Medicine

## 2017-05-29 VITALS — BP 128/82 | HR 67 | Temp 98.6°F | Wt 241.8 lb

## 2017-05-29 DIAGNOSIS — R05 Cough: Secondary | ICD-10-CM

## 2017-05-29 DIAGNOSIS — I5032 Chronic diastolic (congestive) heart failure: Secondary | ICD-10-CM

## 2017-05-29 DIAGNOSIS — Z7901 Long term (current) use of anticoagulants: Secondary | ICD-10-CM | POA: Diagnosis not present

## 2017-05-29 DIAGNOSIS — J22 Unspecified acute lower respiratory infection: Secondary | ICD-10-CM | POA: Diagnosis not present

## 2017-05-29 DIAGNOSIS — G4733 Obstructive sleep apnea (adult) (pediatric): Secondary | ICD-10-CM

## 2017-05-29 DIAGNOSIS — R9430 Abnormal result of cardiovascular function study, unspecified: Secondary | ICD-10-CM | POA: Diagnosis not present

## 2017-05-29 DIAGNOSIS — J40 Bronchitis, not specified as acute or chronic: Secondary | ICD-10-CM | POA: Diagnosis not present

## 2017-05-29 DIAGNOSIS — I272 Pulmonary hypertension, unspecified: Secondary | ICD-10-CM

## 2017-05-29 DIAGNOSIS — R058 Other specified cough: Secondary | ICD-10-CM

## 2017-05-29 MED ORDER — AZITHROMYCIN 250 MG PO TABS: ORAL_TABLET | ORAL | 0 refills | Status: DC

## 2017-05-29 MED ORDER — IPRATROPIUM-ALBUTEROL 0.5-2.5 (3) MG/3ML IN SOLN
3.0000 mL | Freq: Once | RESPIRATORY_TRACT | Status: AC
  Administered 2017-05-29: 3 mL via RESPIRATORY_TRACT

## 2017-05-29 MED ORDER — ALBUTEROL SULFATE HFA 108 (90 BASE) MCG/ACT IN AERS: 2.0000 | INHALATION_SPRAY | Freq: Four times a day (QID) | RESPIRATORY_TRACT | 0 refills | Status: DC | PRN

## 2017-05-29 MED ORDER — BENZONATATE 100 MG PO CAPS: 100.0000 mg | ORAL_CAPSULE | Freq: Three times a day (TID) | ORAL | 0 refills | Status: DC | PRN

## 2017-05-29 NOTE — Patient Instructions (Addendum)
This is a respiratory infection usually viral and run their course but   Because of hte symptoms you  Are having  Adding antibiotic to treat any bacterial infection possible.   Cough med may or may not help until you get better   Sending in   Cough pills to try  And see if helps comfort .   Expect improviment in the next 3-4 days  And let us know if  persistent or progressive    If tight  Lungs can add the inhlaer as needed

## 2017-05-29 NOTE — Progress Notes (Signed)
Chief Complaint  Patient presents with  . Cough    Started about 1 week ago. Pt reports cold starting with scratchy throat and ear irritation and went into chest about 5 days ago.  Productive cough, yellow/green in color.. Has tried Nyquil, Robitussin, Chest Decongestants and cough syrups--no relief    HPI: [name] 71 y.o. come in for SDA for "cold"     Has  underlying chf   Cad Disease and pulm diesase  Hx of acute resp failure  In  Past  But has been stable recently . 6-7 days ago one sided sore thrait and then left ear pain . Coughing  Initially brown color and now green yellow    Mild resp windedness but not like in past . No fever chills   Husband concerned about cough  No excess swelling    chf sx   No unusual bleeding chest pain vomiting.  Some postnasal drainage but no sinus pain. No fever or chills.  Concerned about the cough wakes her up even though she is on the CPAP like other options. ROS: See pertinent positives and negatives per HPI.  Past Medical History:  Diagnosis Date  . Anticoagulant long-term use    pradaxa  . Anxiety   . Arthritis    "fingers, lower back" (04/23/2017   . CAD (coronary artery disease) 9163,8466   post PTCA with bare-metal stenting to mid RCA in December 2004     . CHF (congestive heart failure) ([place])   . Chronic atrial fibrillation ([place]) 06/2007   Tachybradycardia pacemaker  . Chronic kidney disease    10% function - ?R, other kidney is compensating    . CVA (cerebral vascular accident) [facility]) [phone]   denies residual on 04/23/2017  . Depression   . Diplopia 06/19/2008   Qualifier: Diagnosis of  By: [name] MD, [name]   . Dysrhythmia    ATRIAL FIBRILATION  . Edema of lower extremity   . Hyperlipidemia   . Hypertension   . Inferior myocardial infarction [facility])    acute inferior wall mi/other medical hx  . Myocardial infarction ([place]) [id number]  . OSA on CPAP    last test- 2010  . Pacemaker   . Pneumonia 2014   tx. ----   [facility]  . Pulmonary hypertension ([place])    moderate pulmonary hypertension by 10/2016 echo and 10/2013 cardiac cath  . Shortness of breath   . Skin cancer    "cut off right [place]; burned off LLE" (04/23/2017)  . Spondylolisthesis   . TIA (transient ischemic attack) 2008  . Unspecified hemorrhoids without mention of complication 7/79/3903   Colonoscopy--Dr. Carlean Purl     Family History  Problem Relation Age of Onset  . Suicidality Father        suicide death pt was 3 yrs  . Arrhythmia Mother   . Hypertension Mother   . Diabetes Mother   . Heart attack Brother 58  . Heart disease Paternal Aunt   . Prostate cancer Maternal Grandfather   . Diabetes Paternal Grandfather        fathers side of the family    Social History   Socioeconomic History  . Marital status: Married    Spouse name: None  . Number of children: 0  . Years of education: HS  . Highest education level: None  Social Needs  . Financial resource strain: None  . Food insecurity - worry: None  . Food insecurity - inability: None  .  Transportation needs - medical: None  . Transportation needs - non-medical: None  Occupational History  . Occupation: retired    Comment: previously worked [employer]  . Smoking status: Former Smoker    Packs/day: 1.00    Years: 5.00    Pack years: 5.00    Types: Cigarettes    Start date: 05/11/1978    Last attempt to quit: 07/03/1982    Years since quitting: 34.9  . Smokeless tobacco: Never Used  Substance and Sexual Activity  . Alcohol use: Yes    Comment: 04/23/2017 "once q 6 months; glass of wine"  . Drug use: No  . Sexual activity: Not Currently  Other Topics Concern  . 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   Married    Originally from [employer] of two, high school education   Former smoker [phone] 1ppd   Hunting dogs 7    Retired from [employer] 2       Outpatient Medications Prior to Visit  Medication Sig Dispense Refill  . acetaminophen (TYLENOL) 500 MG tablet Take 1,000 mg by mouth 2 (two) times daily.     [name] apixaban (ELIQUIS) 2.5 MG TABS tablet Take 1 tablet (2.5 mg total) 2 (two) times daily by mouth. 30 tablet 0  . Ascorbic Acid (VITAMIN C) 1000 MG tablet Take 1,000 mg by mouth daily.    [name] atorvastatin (LIPITOR) 40 MG tablet TAKE 1 TABLET BY MOUTH EVERY DAY (Patient taking differently: TAKE 40mg  BY MOUTH EVERY DAY) 90 tablet 2  . Calcium Carbonate-Vitamin D (CALCIUM 600+D PO) Take 1 tablet by mouth daily.    . Cyanocobalamin (B-12) 5000 MCG CAPS Take 5,000 mcg by mouth at bedtime.     [name] diltiazem (CARDIZEM CD) 120 MG 24 hr capsule Take 1 capsule (120 mg total) by mouth daily. 30 capsule 11  . FLUoxetine (PROZAC) 20 MG capsule TAKE ONE CAPSULE BY MOUTH EVERY MORNING (Patient taking differently: TAKE 20mg  BY MOUTH EVERY MORNING) 90 capsule 1  . fluticasone (FLONASE) 50 MCG/ACT nasal spray Place 1 spray into both nostrils daily as needed for allergies or rhinitis (before bedtime).     [name] KLOR-CON M20 20 MEQ tablet TAKE 1 TABLET BY MOUTH TWICE A DAY (Patient taking differently: TAKE 20MEq BY MOUTH TWICE A DAY) 180 tablet 3  . loratadine (CLARITIN) 10 MG tablet TAKE 1 TABLET BY MOUTH EVERY DAY 30 tablet 5  . Lysine 1000 MG TABS Take 1,000 mg by mouth daily.    . metoprolol succinate (TOPROL-XL) 50 MG 24 hr tablet Take 1 tablet (50 mg total) by mouth 2 (two) times daily. Take with or immediately following a meal. (Patient taking differently: Take 50 mg by mouth 2 (two) times daily. Take with or immediately following a meal. Verified succinate twice daily) 60 tablet 11  . Multiple Vitamin (MULTIVITAMIN WITH MINERALS) TABS tablet Take 1 tablet by mouth daily. Centrum    . nitroGLYCERIN (NITROSTAT) 0.4 MG SL tablet Place 1 tablet (0.4 mg total) under the tongue every 5 (five) minutes x 3 doses as needed for chest pain. 25 tablet 6  .  pantoprazole (PROTONIX) 40 MG tablet Take 1 tablet (40 mg total) by mouth daily. 30 tablet 2  . Polyethyl Glycol-Propyl Glycol (SYSTANE) 0.4-0.3 % SOLN Place 1 drop into the right eye daily as needed (dry eyes).    [name] PRESCRIPTION  MEDICATION Inhale into the lungs at bedtime. CPAP    . torsemide (DEMADEX) 20 MG tablet Take 2 tablets (40 mg total) 2 (two) times daily by mouth. 120 tablet 11  . vitamin E 1000 UNIT capsule Take 1,000 Units by mouth daily.    [name] oxyCODONE-acetaminophen (ROXICET) 5-325 MG tablet Take 1 tablet by mouth every 4 (four) hours as needed. (Patient not taking: Reported on 05/29/2017) 60 tablet 0  . tiZANidine (ZANAFLEX) 2 MG tablet Take 1 tablet (2 mg total) by mouth every 6 (six) hours as needed for muscle spasms. (Patient not taking: Reported on 05/29/2017) 60 tablet 0   No facility-administered medications prior to visit.      EXAM:  BP 128/82 (BP Location: Right Arm, Patient Position: Sitting, Cuff Size: Normal)   Pulse 67   Temp 98.6 F (37 C) (Oral)   Wt 241 lb 12.8 oz (109.7 kg)   BMI 38.44 kg/m   Body mass index is 38.44 kg/m.  GENERAL: vitals reviewed and listed above, alert, oriented, appears well hydrated and in no acute distress HEENT: atraumatic, conjunctiva  clear, no obvious abnormalities on inspection of external nose and ears appears mildly congested but no face pain some cobblestoning of OP TMs are clear.  OP : no lesion edema or exudate  NECK: no obvious masses on inspection palpation  LUNGS: Somewhat decreased breath sounds throughout and occasional musical expiratory wheezeno moist rales are noted. Few dry crackles ?   After duoneb  ( caused shaliness but tolerated  ) looser cough and  Increase bs noted  No rales CV: HRRR, 62  no clubbing acrocyanosis  nl cap refill  MS: moves all extremities without noticeable focal  abnormality PSYCH: pleasant and cooperative, nl cognition  Lab Results  Component Value Date   WBC 14.3 (H) 05/08/2017   HGB  13.3 05/08/2017   HCT 39.0 05/08/2017   PLT 338 05/08/2017   GLUCOSE 102 (H) 05/08/2017   CHOL 116 10/16/2016   TRIG 139 10/16/2016   HDL 42 10/16/2016   LDLCALC 46 10/16/2016   ALT 27 01/23/2017   AST 19 01/23/2017   NA 138 05/08/2017   K 5.3 (H) 05/08/2017   CL 97 05/08/2017   CREATININE 1.08 (H) 05/08/2017   BUN 27 05/08/2017   CO2 30 (H) 05/08/2017   TSH 4.390 10/16/2016   INR 1.0 05/08/2017   HGBA1C 6.3 05/15/2017   MICROALBUR <0.7 01/23/2017   BP Readings from Last 3 Encounters:  05/29/17 128/82  05/15/17 (!) 134/58  05/09/17 (!) 122/49   Wt Readings from Last 3 Encounters:  05/29/17 241 lb 12.8 oz (109.7 kg)  05/15/17 240 lb 3.2 oz (109 kg)  05/09/17 242 lb (109.8 kg)     ASSESSMENT AND PLAN:  Discussed the following assessment and plan:  Acute respiratory infection - Plan: ipratropium-albuterol (DUONEB) 0.5-2.5 (3) MG/3ML nebulizer solution 3 mL  Obstructive sleep apnea  Anticoagulant long-term use  Respiratory tract congestion with cough - Plan: ipratropium-albuterol (DUONEB) 0.5-2.5 (3) MG/3ML nebulizer solution 3 mL  Wheezy bronchitis - Plan: ipratropium-albuterol (DUONEB) 0.5-2.5 (3) MG/3ML nebulizer solution 3 mL  Chronic diastolic heart failure- grade 2 diastolic dysfunction by echo 10/03/13 At risk a week of bronchitis symptoms some wheezy component purulent coughing no other systemic symptoms Is high risk we will add antibiotic empirically to cover for bacterial secondary infections prescription for albuterol given in case needed or helpful and Tessalon Perles.  Expectant management seek care for any respiratory compromise.  Risk benefit  of antibiotics I think benefit more than risk at this time. -Patient advised to return or notify health care team  if  new concerns arise.  Patient Instructions  This is a respiratory infection usually viral and run their course but   Because of hte symptoms you  Are having  Adding antibiotic to treat any bacterial  infection possible.   Cough med may or may not help until you get better   Sending in   Cough pills to try  And see if helps comfort .   Expect improviment in the next 3-4 days  And let us know if  persistent or progressive    If tight  Lungs can add the inhlaer as needed      Mariann Laster K. [name] M.D.

## 2017-05-30 ENCOUNTER — Other Ambulatory Visit: Payer: Self-pay | Admitting: *Deleted

## 2017-05-30 DIAGNOSIS — E876 Hypokalemia: Secondary | ICD-10-CM

## 2017-05-30 LAB — BASIC METABOLIC PANEL
BUN/Creatinine Ratio: 21 (ref 12–28)
BUN: 21 mg/dL (ref 8–27)
CO2: 23 mmol/L (ref 20–29)
Calcium: 8.9 mg/dL (ref 8.7–10.3)
Chloride: 97 mmol/L (ref 96–106)
Creatinine, Ser: 0.98 mg/dL (ref 0.57–1.00)
GFR calc Af Amer: 67 mL/min/{1.73_m2} (ref >59)
GFR calc non Af Amer: 58 mL/min/{1.73_m2} — ABNORMAL LOW (ref >59)
Glucose: 144 mg/dL — ABNORMAL HIGH (ref 65–99)
Potassium: 3.3 mmol/L — ABNORMAL LOW (ref 3.5–5.2)
Sodium: 141 mmol/L (ref 134–144)

## 2017-06-04 ENCOUNTER — Ambulatory Visit: Payer: PPO | Admitting: Internal Medicine

## 2017-06-04 DIAGNOSIS — M25662 Stiffness of left knee, not elsewhere classified: Secondary | ICD-10-CM | POA: Diagnosis not present

## 2017-06-04 DIAGNOSIS — R262 Difficulty in walking, not elsewhere classified: Secondary | ICD-10-CM | POA: Diagnosis not present

## 2017-06-04 DIAGNOSIS — M25562 Pain in left knee: Secondary | ICD-10-CM | POA: Diagnosis not present

## 2017-06-06 ENCOUNTER — Other Ambulatory Visit: Payer: PPO | Admitting: *Deleted

## 2017-06-06 DIAGNOSIS — E876 Hypokalemia: Secondary | ICD-10-CM

## 2017-06-06 DIAGNOSIS — M25562 Pain in left knee: Secondary | ICD-10-CM | POA: Diagnosis not present

## 2017-06-06 DIAGNOSIS — R262 Difficulty in walking, not elsewhere classified: Secondary | ICD-10-CM | POA: Diagnosis not present

## 2017-06-06 DIAGNOSIS — M25662 Stiffness of left knee, not elsewhere classified: Secondary | ICD-10-CM | POA: Diagnosis not present

## 2017-06-06 LAB — BASIC METABOLIC PANEL
BUN/Creatinine Ratio: 23 (ref 12–28)
BUN: 25 mg/dL (ref 8–27)
CO2: 27 mmol/L (ref 20–29)
Calcium: 9.5 mg/dL (ref 8.7–10.3)
Chloride: 97 mmol/L (ref 96–106)
Creatinine, Ser: 1.1 mg/dL — ABNORMAL HIGH (ref 0.57–1.00)
GFR calc Af Amer: 58 mL/min/{1.73_m2} — ABNORMAL LOW (ref >59)
GFR calc non Af Amer: 51 mL/min/{1.73_m2} — ABNORMAL LOW (ref >59)
Glucose: 99 mg/dL (ref 65–99)
Potassium: 4.1 mmol/L (ref 3.5–5.2)
Sodium: 140 mmol/L (ref 134–144)

## 2017-06-13 ENCOUNTER — Ambulatory Visit: Payer: PPO | Admitting: Cardiovascular Disease

## 2017-06-15 ENCOUNTER — Other Ambulatory Visit: Payer: Self-pay | Admitting: Internal Medicine

## 2017-06-26 DIAGNOSIS — I5040 Unspecified combined systolic (congestive) and diastolic (congestive) heart failure: Secondary | ICD-10-CM | POA: Diagnosis not present

## 2017-06-26 DIAGNOSIS — G4733 Obstructive sleep apnea (adult) (pediatric): Secondary | ICD-10-CM | POA: Diagnosis not present

## 2017-06-26 DIAGNOSIS — M1712 Unilateral primary osteoarthritis, left knee: Secondary | ICD-10-CM | POA: Diagnosis not present

## 2017-06-29 NOTE — Telephone Encounter (Signed)
Spoke with patient to review coronary CT instructions. We discussed that her last bmet was on 12/5 and she states she is currently out of town and will come back into town on Tuesday. I asked her to arrive at the hospital by 9:15 am for her 10:00 am CT because a repeat bmet may need to be obtained. The patient will hold torsemide the morning of the test and is aware to avoid Claritin or any other antihistamines for 12 hours prior to the test. She verbalized understanding of all instructions and thanked me for the call.

## 2017-07-04 ENCOUNTER — Ambulatory Visit (HOSPITAL_COMMUNITY): Admission: RE | Admit: 2017-07-04 | Payer: PPO | Source: Ambulatory Visit

## 2017-07-04 ENCOUNTER — Ambulatory Visit (HOSPITAL_COMMUNITY)
Admission: RE | Admit: 2017-07-04 | Discharge: 2017-07-04 | Disposition: A | Discharge status: Home / Self Care | Payer: PPO | Source: Ambulatory Visit | Attending: Cardiovascular Disease | Admitting: Cardiovascular Disease

## 2017-07-04 DIAGNOSIS — I272 Pulmonary hypertension, unspecified: Secondary | ICD-10-CM | POA: Insufficient documentation

## 2017-07-04 DIAGNOSIS — I700 Atherosclerosis of aorta: Secondary | ICD-10-CM | POA: Insufficient documentation

## 2017-07-04 DIAGNOSIS — R9430 Abnormal result of cardiovascular function study, unspecified: Secondary | ICD-10-CM | POA: Insufficient documentation

## 2017-07-04 DIAGNOSIS — R0609 Other forms of dyspnea: Secondary | ICD-10-CM | POA: Diagnosis not present

## 2017-07-04 MED ORDER — METOPROLOL TARTRATE 5 MG/5ML IV SOLN
5.0000 mg | INTRAVENOUS | Status: DC | PRN
  Administered 2017-07-04 (×3): 5 mg via INTRAVENOUS
  Filled 2017-07-04: qty 5

## 2017-07-04 MED ORDER — METOPROLOL TARTRATE 5 MG/5ML IV SOLN
INTRAVENOUS | Status: AC
  Filled 2017-07-04: qty 10

## 2017-07-04 MED ORDER — IOPAMIDOL (ISOVUE-370) INJECTION 76%
INTRAVENOUS | Status: AC
  Administered 2017-07-04: 80 mL
  Filled 2017-07-04: qty 100

## 2017-07-05 DIAGNOSIS — M25562 Pain in left knee: Secondary | ICD-10-CM | POA: Diagnosis not present

## 2017-07-10 ENCOUNTER — Ambulatory Visit: Payer: PPO | Admitting: Internal Medicine

## 2017-07-23 ENCOUNTER — Telehealth: Payer: Self-pay | Admitting: Internal Medicine

## 2017-07-23 ENCOUNTER — Telehealth: Payer: Self-pay | Admitting: Cardiovascular Disease

## 2017-07-23 NOTE — Telephone Encounter (Signed)
New message   Patient is request a discontinue order on her oxygen. She states she is not using the oxygen and [facility] will not pick it up without an order. Requesting d/c order be faxed to [phone]. Patient states she is not sure who ordered  the oxygen while she was in the hospital.

## 2017-07-23 NOTE — Telephone Encounter (Signed)
Ok to [facility]  Make sure it is clear that I never ordered    o2    And  Am not responsible for any charges to insurance ( either cardiology or  Orthopedics   Did this and not a PCP involvment  thanks

## 2017-07-23 NOTE — Telephone Encounter (Signed)
I didn't order the  oxygen  So must have happened by ortho surgeon?    I see a order for h h  And oxygen with dr Mayer Camel       If he cant d/c the order then we can do that ( if that is legal.  In medicare rules)    But certainly  Medicare should not be charged for oxygen not being used.

## 2017-07-23 NOTE — Telephone Encounter (Signed)
O2 was ordered by hospitalist during one of her stays.

## 2017-07-23 NOTE — Telephone Encounter (Signed)
Drue Novel I, RN   07/23/17 12:29 PM  Note    Returned call to patient. Patient requesting an order from Dr. Acie Fredrickson to be sent to [facility] to discontinue home oxygen. She states that she is not using it anymore and they will not pick it up without an order. She states that she is not sure who ordered for her to have oxygen at home. It looks like home oxygen was ordered while the patient was in the hospital for her orthopedic surgery back in October 2018. Advised patient to call her PCP for this order. Patient verbalized understanding and thanked me for the call.      Pt requesting order to d/c O2 be sent to [facility].

## 2017-07-23 NOTE — Telephone Encounter (Signed)
Returned call to patient. Patient requesting an order from Dr. Acie Fredrickson to be sent to [facility] to discontinue home oxygen. She states that she is not using it anymore and they will not pick it up without an order. She states that she is not sure who ordered for her to have oxygen at home. It looks like home oxygen was ordered while the patient was in the hospital for her orthopedic surgery back in October 2018. Advised patient to call her PCP for this order. Patient verbalized understanding and thanked me for the call.

## 2017-07-23 NOTE — Telephone Encounter (Signed)
Copied from [place] [phone]. Topic: General - Other >> Jul 23, 2017 12:38 PM Synthia Innocent wrote: Reason for CRM: Needs faxed release from [facility] for Oxygen, that why they will be able to pick up oxygen tank, [facility] fax # [phone]

## 2017-07-23 NOTE — Telephone Encounter (Signed)
Copied from [place] [id number]. Topic: General - Other >> Jul 23, 2017 12:38 PM Synthia Innocent wrote: Reason for CRM: Needs faxed release from [facility] for Oxygen, that why they will be able to pick up oxygen tank, [facility] fax # [phone]

## 2017-07-24 NOTE — Telephone Encounter (Signed)
Order to d/c O2 signed and faxed to [facility].

## 2017-08-06 ENCOUNTER — Ambulatory Visit: Payer: PPO | Admitting: Cardiovascular Disease

## 2017-08-06 ENCOUNTER — Encounter: Payer: Self-pay | Admitting: Cardiovascular Disease

## 2017-08-06 VITALS — BP 98/58 | HR 98 | Ht 72.0 in | Wt 237.0 lb

## 2017-08-06 DIAGNOSIS — I2510 Atherosclerotic heart disease of native coronary artery without angina pectoris: Secondary | ICD-10-CM

## 2017-08-06 DIAGNOSIS — I5032 Chronic diastolic (congestive) heart failure: Secondary | ICD-10-CM

## 2017-08-06 DIAGNOSIS — E782 Mixed hyperlipidemia: Secondary | ICD-10-CM

## 2017-08-06 DIAGNOSIS — I480 Paroxysmal atrial fibrillation: Secondary | ICD-10-CM

## 2017-08-06 NOTE — Progress Notes (Signed)
[name] Date of Birth  11-24-1945 [facility] [address]    [place], [place]  [address] [phone]  Fax  [phone]   Problem list: 1. Atrial fibrillation - s/p ablation September 29, 2016 2. Status post pacemaker implantation 3. Coronary artery disease-status post PTCA and stenting of her right coronary artery 4. Hyperlipidemia 5. Hypertension 6. Obstructive sleep apnea-currently on CPAP 7.   History of Present Illness:  [name] is a 72 year old female with a history of atrial fibrillation. She status post pacemaker implantation. She also has a history of coronary artery disease and is status post PTCA and stenting of the right coronary artery. She also has a history of hyperlipidemia, hypertension, and sleep apnea.  She uses a CPAP mask at night.  The CPAP mask has really helped her stay in normal sinus rhythm.  She complains of having some episodes of chest tightness. This occurs at times. One episode was relieved with sublingual nitroglycerin. She states that it is not nearly as severe as her previous episodes of angina.  January 13, 21014: She was hospitalized in November with severe shortness breath and "double pneumonia". She is slowly recovering.  She's looking forward to  getting back into her exercise program.  She has not been exercising at all.  She's had some leg edema.  She denies any chest pain or shortness breath.  She's has mild leg swelling for the past several months. She does not add salt any of her food. She tries to stay away from salty foods and processed meat.  September 18, 2013:  She remains short of breath  - especialy with exertion  Echo in Jan. 2014:   - Left ventricle: The cavity size was normal. Wall thickness was increased in a pattern of mild LVH. Systolic function was normal. The estimated ejection fraction was in the range of 60% to 65%. - Pulmonary arteries: PA peak pressure: 45mm Hg   October 03, 2013:  [name] was seen about 2  weeks ago with some increasing shortness of breath. She was noted to have moderate pulmonary hypertension on echo. She started on diltiazem. She has fairly well preserved left ventricle systolic function.  We started her on Cardizem XL 180 mg a day. She has not done well since that medication in addition. She's more short of breath. She's gained about 5 pounds.  She presents today for her echocardiogram. She is very short of breath and was worked into the schedule. Her O2 saturations at rest were 86%. We placed  oxygen on her and her O2 saturations increased above 90%. She is clearly more short of breath after the addition of the diltiazem.   She has not been eating any extra salt .  Has been taking her lasix.   She denies any chest pain - specifically was asked about pleuretic CP.  Had orthopnea until we placed the oxygen on her.   10/20/2013: [name] was admitted to the hospital with severe shortness breath when I saw her several weeks ago. She was found to be volume overload. She was aggressively diuresed and lost about 10 pounds of weight. Cardiac catheterization following that diuresis revealed only minimal coronary artery irregularities. Her left ventricular systolic function was normal. Her PA pressures were moderately elevated with an estimated PA pressure of around 57.   She was discharged on the same dose of Lasix that she was on at admission.   She is avoiding salt.    Dec. 3, 2015:  [name] is a 72 yo followed for chronic diastolic CHf and pulmonary hypertension ( est. PA pressure of 70)  [name] has been back in the hospital since I last saw her.  She has  Increased her lasix which has helped.  She does a pretty good job of avoiding salt.   Sept. 27, 2016:  Is doing well.  Has some dyspnea - she thinks likely due to weight gain . No cardiac symptoms .  Needs to have back surgery .   October 06, 2015:  Overall doing well. Had her back surgery in Oct.  Wt. 267 lbs today   Jan. 4,  2018:  [name] is seen back today following a recent hospitalization in Sept. 2017.   She was complaining of dyspnea , cough. She was cardioverted  She has diastolic dysfunction   She is now back in atrial fib. She new that she had converted back into a-fib.   HR are typically elevated when she is in atrial fib.   Has had some right flank pain .   Was found to have an atrophic right kidney.  Was in the hospital in [place], [place].   Has not tried a higher dose of Tikosyn.    Labs from her recent hospitalization on 06/24/2016 show a potassium level of 4.3. Her magnesium level was 2.0.  October 16, 2016:  [name] is seen today  She has had an A-Fib ablation ( September 29, 2016) since I have seen her She is very shaky today - having trouble getting her breath while walking in today , her lasix was held following her ablation.    She restarted her Lasix 2 weeks ago .   She is feeling generally better since restarting the Lasix   11/21/2016: [name] was recently hospitalized with acute on chronic diastolic congestive heart failure. She had been traveling quite a bit and had been eating lots of salty food. She had also been holding various doses of Lasix set she was traveling. She was diuresed 7 lbs.  Feeling much better   She was supposed to decrease her amio to 200 mg daily but has continued on the 200 mg BID.  Nov. 6, 2018: [name] is seen today for further evaluation of her increasing shortness of breath.  Her amiodarone was stopped several weeks ago. Had left knee replacement 2 weeks ago .   Is doing well.  Is having some chest tightness . Is avoiding salt.   August 06, 2017:  [name] is seen today  Heart catheterization on November 7 revealed a patent stent in the right coronary artery and a chronic total occlusion of a small acute marginal branch.  There was lateral filling of this acute marginal from the left system. She has vigorous left ventricular systolic function with mildly elevated left  ventricular end-diastolic pressure.  She had moderate pulmonary hypertension with PA pressure of 48 mmHg.  The patient had a high cardiac output (8.5 L/min)  The pulmonary vascular resistance is only mildly elevated at 3 Wood units.  Because of her pulmonary hypertension and the discrepancy between the wedge pressure in the left ventricular end-diastolic pressure.  There was some concerning of pulmonary vein stenosis in this patient who has previously had A. fib ablation.  CT angiogram reveal no pulmonary vein stenosis   Doing well Has lost 7 lbs since Nov.   Overall doing well Is taking deeper breaths - is feeling better    Current Outpatient Medications on File Prior to Visit  Medication Sig Dispense Refill  . acetaminophen (TYLENOL) 500 MG tablet Take 1,000 mg by mouth 2 (two) times daily.     [name] albuterol (PROVENTIL HFA;VENTOLIN HFA) 108 (90 Base) MCG/ACT inhaler Inhale 2 puffs into the lungs every 6 (six) hours as needed for wheezing or shortness of breath. cough 1 Inhaler 0  . Ascorbic Acid (VITAMIN C) 1000 MG tablet Take 1,000 mg by mouth daily.    [name] atorvastatin (LIPITOR) 40 MG tablet TAKE 1 TABLET BY MOUTH EVERY DAY (Patient taking differently: TAKE 40mg  BY MOUTH EVERY DAY) 90 tablet 2  . Calcium Carbonate-Vitamin D (CALCIUM 600+D PO) Take 1 tablet by mouth daily.    . Cyanocobalamin (B-12) 5000 MCG CAPS Take 5,000 mcg by mouth at bedtime.     [name] diltiazem (CARDIZEM CD) 120 MG 24 hr capsule Take 1 capsule (120 mg total) by mouth daily. 30 capsule 11  . FLUoxetine (PROZAC) 20 MG capsule TAKE ONE CAPSULE BY MOUTH EVERY MORNING 90 capsule 1  . fluticasone (FLONASE) 50 MCG/ACT nasal spray Place 1 spray into both nostrils daily as needed for allergies or rhinitis (before bedtime).     [name] KLOR-CON M20 20 MEQ tablet TAKE 1 TABLET BY MOUTH TWICE A DAY (Patient taking differently: TAKE 20MEq BY MOUTH TWICE A DAY) 180 tablet 3  . loratadine (CLARITIN) 10 MG tablet TAKE 1 TABLET BY MOUTH EVERY DAY 30  tablet 5  . Lysine 1000 MG TABS Take 1,000 mg by mouth daily.    . metoprolol succinate (TOPROL-XL) 50 MG 24 hr tablet Take 1 tablet (50 mg total) by mouth 2 (two) times daily. Take with or immediately following a meal. (Patient taking differently: Take 50 mg by mouth 2 (two) times daily. Take with or immediately following a meal. Verified succinate twice daily) 60 tablet 11  . Multiple Vitamin (MULTIVITAMIN WITH MINERALS) TABS tablet Take 1 tablet by mouth daily. Centrum    . nitroGLYCERIN (NITROSTAT) 0.4 MG SL tablet Place 1 tablet (0.4 mg total) under the tongue every 5 (five) minutes x 3 doses as needed for chest pain. 25 tablet 6  . pantoprazole (PROTONIX) 40 MG tablet Take 1 tablet (40 mg total) by mouth daily. 30 tablet 2  . Polyethyl Glycol-Propyl Glycol (SYSTANE) 0.4-0.3 % SOLN Place 1 drop into the right eye daily as needed (dry eyes).    [name] PRESCRIPTION MEDICATION Inhale into the lungs at bedtime. CPAP    . vitamin E 1000 UNIT capsule Take 1,000 Units by mouth daily.    [name] ELIQUIS 5 MG TABS tablet Take 5 mg by mouth 2 (two) times daily.  11   No current facility-administered medications on file prior to visit.     Allergies  Allergen Reactions  . Adhesive [Tape] Rash    Allergic to EKG stickers and defibrillation pads.    Past Medical History:  Diagnosis Date  . Anticoagulant long-term use    pradaxa  . Anxiety   . Arthritis    "fingers, lower back" (04/23/2017   . CAD (coronary artery disease) 6384,6659   post PTCA with bare-metal stenting to mid RCA in December 2004     . CHF (congestive heart failure) ([place])   . Chronic atrial fibrillation ([place]) 06/2007   Tachybradycardia pacemaker  . Chronic kidney disease    10% function - ?R, other kidney is compensating    . CVA (cerebral vascular accident) [facility]) [phone]   denies residual on 04/23/2017  . Depression   . Diplopia 06/19/2008  Qualifier: Diagnosis of  By: [name] MD, [name]   . Dysrhythmia    ATRIAL FIBRILATION   . Edema of lower extremity   . Hyperlipidemia   . Hypertension   . Inferior myocardial infarction [facility])    acute inferior wall mi/other medical hx  . Myocardial infarction ([place]) [id number]  . OSA on CPAP    last test- 2010  . Pacemaker   . Pneumonia 2014   tx. ----  [facility]  . Pulmonary hypertension ([place])    moderate pulmonary hypertension by 10/2016 echo and 10/2013 cardiac cath  . Shortness of breath   . Skin cancer    "cut off right Jumper; burned off LLE" (04/23/2017)  . Spondylolisthesis   . TIA (transient ischemic attack) 2008  . Unspecified hemorrhoids without mention of complication 4/40/1027   Colonoscopy--Dr. Carlean Purl     Past Surgical History:  Procedure Laterality Date  . APPENDECTOMY  1984  . ATRIAL FIBRILLATION ABLATION N/A 09/29/2016   Procedure: Atrial Fibrillation Ablation;  Surgeon: Will Meredith Leeds, MD;  Location: [place] [facility];  Service: Cardiovascular;  Laterality: N/A;  . BACK SURGERY    . CATARACT EXTRACTION W/ INTRAOCULAR LENS  IMPLANT, BILATERAL Bilateral 01/15/2017- 03/2017  . CORONARY ANGIOPLASTY  X 2  . CORONARY ANGIOPLASTY WITH STENT PLACEMENT  1998; ~ 2007; ?date   "1 stent; replaced stent; not sure when I got the last stent" (04/23/2017)  . DOPPLER ECHOCARDIOGRAPHY  2009  . ELECTROPHYSIOLOGIC STUDY N/A 03/31/2016   Procedure: Cardioversion;  Surgeon: Evans Lance, MD;  Location: [place] [facility];  Service: Cardiovascular;  Laterality: N/A;  . ELECTROPHYSIOLOGIC STUDY N/A 08/04/2016   Procedure: Cardioversion;  Surgeon: Evans Lance, MD;  Location: [facility];  Service: Cardiovascular;  Laterality: N/A;  . INSERT / REPLACE / REMOVE PACEMAKER  06/2007  . JOINT REPLACEMENT    . LAPAROSCOPIC CHOLECYSTECTOMY  1994  . LEFT AND RIGHT HEART CATHETERIZATION WITH CORONARY ANGIOGRAM N/A 10/06/2013   Procedure: LEFT AND RIGHT HEART CATHETERIZATION WITH CORONARY ANGIOGRAM;  Surgeon: Troy Sine, MD;  Location: [facility];  Service:  Cardiovascular;  Laterality: N/A;  . LEFT OOPHORECTOMY Left ~ 1989  . POSTERIOR LUMBAR FUSION  2000s - 04/2015 X 3   L3-4; L4-5; L2-3; Dr Trenton Gammon  . RIGHT/LEFT HEART CATH AND CORONARY ANGIOGRAPHY N/A 05/09/2017   Procedure: RIGHT/LEFT HEART CATH AND CORONARY ANGIOGRAPHY;  Surgeon: Sherren Mocha, MD;  Location: [place] [facility];  Service: Cardiovascular;  Laterality: N/A;  . SKIN CANCER EXCISION Right    [place]  . TEE WITHOUT CARDIOVERSION N/A 09/29/2016   Procedure: TRANSESOPHAGEAL ECHOCARDIOGRAM (TEE);  Surgeon: Jerline Pain, MD;  Location: [place];  Service: Cardiovascular;  Laterality: N/A;  . TOTAL ABDOMINAL HYSTERECTOMY  1984   "uterus & right ovary"  . TOTAL KNEE ARTHROPLASTY Left 04/23/2017  . TOTAL KNEE ARTHROPLASTY Left 04/23/2017   Procedure: TOTAL KNEE ARTHROPLASTY;  Surgeon: Frederik Pear, MD;  Location: [place];  Service: Orthopedics;  Laterality: Left;  . ULTRASOUND GUIDANCE FOR VASCULAR ACCESS  05/09/2017   Procedure: Ultrasound Guidance For Vascular Access;  Surgeon: Sherren Mocha, MD;  Location: [place] [facility];  Service: Cardiovascular;;    Social History   Tobacco Use  Smoking Status Former Smoker  . Packs/day: 1.00  . Years: 5.00  . Pack years: 5.00  . Types: Cigarettes  . Start date: 05/11/1978  . Last attempt to quit: 07/03/1982  . Years since quitting: 35.1  Smokeless Tobacco  Never Used    Social History   Substance and Sexual Activity  Alcohol Use Yes   Comment: 04/23/2017 "once q 6 months; glass of wine"    Family History  Problem Relation Age of Onset  . Suicidality Father        suicide death pt was 3 yrs  . Arrhythmia Mother   . Hypertension Mother   . Diabetes Mother   . Heart attack Brother 92  . Heart disease Paternal Aunt   . Prostate cancer Maternal Grandfather   . Diabetes Paternal Grandfather        fathers side of the family    Reviw of Systems:     Physical Exam: Blood pressure (!) 98/58, pulse 98, height 6' (1.829 m),  weight 237 lb (107.5 kg), SpO2 97 %.  GEN:   moderatly obese.   HEENT: Normal NECK: No JVD; No carotid bruits LYMPHATICS: No lymphadenopathy CARDIAC:   Irreg. Irreg.  RESPIRATORY:  Clear to auscultation without rales, wheezing or rhonchi  ABDOMEN: Soft, non-tender, non-distended MUSCULOSKELETAL:  No edema; No deformity  SKIN: Warm and dry NEUROLOGIC:  Alert and oriented x 3   ECG:    Assessment / Plan:  1. Acute on chronic diastolic CHF:    Grabiela seems to be doing better.  Continue with weight loss plan.  Her blood pressure is well controlled.  2. Atrial fibrillation -    she has a history of paroxysmal atrial fibrillation.  She is in A. fib today.  2. Status post pacemaker implantation:    3. Coronary artery disease-heart catheterization in November revealed a patent stent in her RCA.  4. Hyperlipidemia -    Labs have been stable,  Will check in 6 months    5. Hypertension -   BP is low today    6. Obstructive sleep apnea-currently on CPAP - follow up with pulmonary   7. Spinal disc disease.       Mertie Moores, MD  08/06/2017 10:16 AM    [address],  [place], [place]  [address] Pager [phone] Phone: [phone]; Fax: [phone]

## 2017-08-06 NOTE — Patient Instructions (Addendum)
Medication Instructions:  Your physician recommends that you continue on your current medications as directed. Please refer to the Current Medication list given to you today.   Labwork: Your physician recommends that you return for lab work in: 6 months on the day of or a few days before your office visit with Dr. Acie Fredrickson.  You will need to FAST for this appointment - nothing to eat or drink after midnight the night before except water.    Testing/Procedures: None Ordered   Follow-Up: Your physician recommends that you schedule a follow-up appointment in: March 2019 with Dr. Curt Bears   Your physician wants you to follow-up in: 6 months with Dr. Acie Fredrickson. You will receive a reminder letter in the mail two months in advance. If you don't receive a letter, please call our office to schedule the follow-up appointment.   If you need a refill on your cardiac medications before your next appointment, please call your pharmacy.   Thank you for choosing [facility]! Christen Bame, RN [phone]

## 2017-09-03 DIAGNOSIS — D225 Melanocytic nevi of trunk: Secondary | ICD-10-CM | POA: Diagnosis not present

## 2017-09-03 DIAGNOSIS — L570 Actinic keratosis: Secondary | ICD-10-CM | POA: Diagnosis not present

## 2017-09-03 DIAGNOSIS — C44619 Basal cell carcinoma of skin of left upper limb, including shoulder: Secondary | ICD-10-CM | POA: Diagnosis not present

## 2017-09-03 DIAGNOSIS — D485 Neoplasm of uncertain behavior of skin: Secondary | ICD-10-CM | POA: Diagnosis not present

## 2017-09-03 DIAGNOSIS — Z85828 Personal history of other malignant neoplasm of skin: Secondary | ICD-10-CM | POA: Diagnosis not present

## 2017-09-03 DIAGNOSIS — D1801 Hemangioma of skin and subcutaneous tissue: Secondary | ICD-10-CM | POA: Diagnosis not present

## 2017-09-03 DIAGNOSIS — D2272 Melanocytic nevi of left lower limb, including hip: Secondary | ICD-10-CM | POA: Diagnosis not present

## 2017-09-03 DIAGNOSIS — L821 Other seborrheic keratosis: Secondary | ICD-10-CM | POA: Diagnosis not present

## 2017-09-03 DIAGNOSIS — D2271 Melanocytic nevi of right lower limb, including hip: Secondary | ICD-10-CM | POA: Diagnosis not present

## 2017-09-04 ENCOUNTER — Other Ambulatory Visit: Payer: Self-pay | Admitting: Cardiology

## 2017-09-05 NOTE — Progress Notes (Signed)
Electrophysiology Office Note   Date:  09/07/2017   ID:  [name], DOB Feb 24, 1946, MRN [id number]  PCP:  Samantha Medin, [name]  Primary Electrophysiologist: Samantha Alken, [name]    Chief Complaint  Patient presents with  . Pacemaker Check    PAF/Sinus node dysfunction  . Shortness of Breath     History of Present Illness: [name] is a 72 y.o. female who presents today for electrophysiology evaluation.   She has a history of coronary artery disease status post stent to the RCA in 2004, CHF, atrial fibrillation with tachybradycardia syndrome status post pacemaker, CKD, hypertension, hyperlipidemia, inferior wall MI, sleep apnea on CPAP, stroke. She is maintaining sinus rhythm on CPAP and dofetilide until she got an infection and when in atrial fibrillation. Had ablation on 09/29/16.  Today, denies symptoms of palpitations, chest [name], orthopnea, PND, lower extremity edema, claudication, dizziness, presyncope, syncope, bleeding, or neurologic sequela. The patient is tolerating medications without difficulties.  In complaint today is of shortness of breath and fatigue.  On device interrogation, he went back into atrial fibrillation in December.  She has been in atrial fibrillation since that time.   Past Medical History:  Diagnosis Date  . Anticoagulant long-term use    pradaxa  . Anxiety   . Arthritis    "fingers, lower back" (04/23/2017   . CAD (coronary artery disease) 2725,3664   post PTCA with bare-metal stenting to mid RCA in December 2004     . CHF (congestive heart failure) ([place])   . Chronic atrial fibrillation ([place]) 06/2007   Tachybradycardia pacemaker  . Chronic kidney disease    10% function - ?R, other kidney is compensating    . CVA (cerebral vascular accident) [facility]) [id number]   denies residual on 04/23/2017  . Depression   . Diplopia 06/19/2008   Qualifier: Diagnosis of  By: [name], [name]   . Dysrhythmia    ATRIAL FIBRILATION  . Edema of  lower extremity   . Hyperlipidemia   . Hypertension   . Inferior myocardial infarction [facility])    acute inferior wall mi/other medical hx  . Myocardial infarction ([place]) [id number]  . OSA on CPAP    last test- 2010  . Pacemaker   . Pneumonia 2014   tx. ----  [facility]  . Pulmonary hypertension ([place])    moderate pulmonary hypertension by 10/2016 echo and 10/2013 cardiac cath  . Shortness of breath   . Skin cancer    "cut off right Gohr; burned off LLE" (04/23/2017)  . Spondylolisthesis   . TIA (transient ischemic attack) 2008  . Unspecified hemorrhoids without mention of complication 9/56/3875   Colonoscopy--Dr. Carlean Purl    Past Surgical History:  Procedure Laterality Date  . APPENDECTOMY  1984  . ATRIAL FIBRILLATION ABLATION N/A 09/29/2016   Procedure: Atrial Fibrillation Ablation;  Surgeon: Samantha Mcadory Meredith Leeds, [name];  Location: [place] CV LAB;  Service: Cardiovascular;  Laterality: N/A;  . BACK SURGERY    . CATARACT EXTRACTION W/ INTRAOCULAR LENS  IMPLANT, BILATERAL Bilateral 01/15/2017- 03/2017  . CORONARY ANGIOPLASTY  X 2  . CORONARY ANGIOPLASTY WITH STENT PLACEMENT  1998; ~ 2007; ?date   "1 stent; replaced stent; not sure when I got the last stent" (04/23/2017)  . DOPPLER ECHOCARDIOGRAPHY  2009  . ELECTROPHYSIOLOGIC STUDY N/A 03/31/2016   Procedure: Cardioversion;  Surgeon: Samantha Lance, [name];  Location: [place] CV LAB;  Service: Cardiovascular;  Laterality: N/A;  . ELECTROPHYSIOLOGIC STUDY  N/A 08/04/2016   Procedure: Cardioversion;  Surgeon: Samantha Lance, [name];  Location: [place] CV LAB;  Service: Cardiovascular;  Laterality: N/A;  . INSERT / REPLACE / REMOVE PACEMAKER  06/2007  . JOINT REPLACEMENT    . LAPAROSCOPIC CHOLECYSTECTOMY  1994  . LEFT AND RIGHT HEART CATHETERIZATION WITH CORONARY ANGIOGRAM N/A 10/06/2013   Procedure: LEFT AND RIGHT HEART CATHETERIZATION WITH CORONARY ANGIOGRAM;  Surgeon: Samantha Sine, [name];  [facility] C[facility] LAB;  Service: Cardiovascular;   Laterality: N/A;  . LEFT OOPHORECTOMY Left ~ 1989  . POSTERIOR LUMBAR FUSION  2000s - 04/2015 X 3   L3-4; L4-5; L2-3; Dr [name]  . RIGHT/LEFT HEART CATH AND CORONARY ANGIOGRAPHY N/A 05/09/2017   Procedure: RIGHT/LEFT HEART CATH AND CORONARY ANGIOGRAPHY;  Surgeon: Samantha Mocha, [name];  Location: [place] CV LAB;  Service: Cardiovascular;  Laterality: N/A;  . SKIN CANCER EXCISION Right    [place]  . TEE WITHOUT CARDIOVERSION N/A 09/29/2016   Procedure: TRANSESOPHAGEAL ECHOCARDIOGRAM (TEE);  Surgeon: Samantha Pain, [name];  Location: [place];  Service: Cardiovascular;  Laterality: N/A;  . TOTAL ABDOMINAL HYSTERECTOMY  1984   "uterus & right ovary"  . TOTAL KNEE ARTHROPLASTY Left 04/23/2017  . TOTAL KNEE ARTHROPLASTY Left 04/23/2017   Procedure: TOTAL KNEE ARTHROPLASTY;  Surgeon: Samantha Pear, [name];  Location: [place];  Service: Orthopedics;  Laterality: Left;  . ULTRASOUND GUIDANCE FOR VASCULAR ACCESS  05/09/2017   Procedure: Ultrasound Guidance For Vascular Access;  Surgeon: Samantha Mocha, [name];  Location: [place] CV LAB;  Service: Cardiovascular;;     Current Outpatient Medications  Medication Sig Dispense Refill  . acetaminophen (TYLENOL) 500 MG tablet Take 1,000 mg by mouth 2 (two) times daily.     . Ascorbic Acid (VITAMIN C) 1000 MG tablet Take 1,000 mg by mouth daily.    [name] atorvastatin (LIPITOR) 40 MG tablet TAKE 1 TABLET BY MOUTH EVERY DAY (Patient taking differently: TAKE 40mg  BY MOUTH EVERY DAY) 90 tablet 2  . Calcium Carbonate-Vitamin D (CALCIUM 600+D PO) Take 1 tablet by mouth daily.    . Cyanocobalamin (B-12) 5000 MCG CAPS Take 5,000 mcg by mouth at bedtime.     [name] diltiazem (CARDIZEM CD) 120 MG 24 hr capsule TAKE 1 CAPSULE (120 MG TOTAL) BY MOUTH DAILY. 30 capsule 11  . ELIQUIS 5 MG TABS tablet Take 5 mg by mouth 2 (two) times daily.  11  . FLUoxetine (PROZAC) 20 MG capsule TAKE ONE CAPSULE BY MOUTH EVERY MORNING 90 capsule 1  . fluticasone (FLONASE) 50 MCG/ACT nasal spray Place  1 spray into both nostrils daily as needed for allergies or rhinitis (before bedtime).     [name] KLOR-CON M20 20 MEQ tablet TAKE 1 TABLET BY MOUTH TWICE A DAY (Patient taking differently: TAKE 20MEq BY MOUTH TWICE A DAY) 180 tablet 3  . loratadine (CLARITIN) 10 MG tablet TAKE 1 TABLET BY MOUTH EVERY DAY 30 tablet 5  . Lysine 1000 MG TABS Take 1,000 mg by mouth daily.    . metoprolol succinate (TOPROL-XL) 50 MG 24 hr tablet Take 1 tablet (50 mg total) by mouth 2 (two) times daily. Take with or immediately following a meal. (Patient taking differently: Take 50 mg by mouth 2 (two) times daily. Take with or immediately following a meal. Verified succinate twice daily) 60 tablet 11  . Multiple Vitamin (MULTIVITAMIN WITH MINERALS) TABS tablet Take 1 tablet by mouth daily. Centrum    . nitroGLYCERIN (NITROSTAT) 0.4 MG SL tablet Place 1 tablet (  0.4 mg total) under the tongue every 5 (five) minutes x 3 doses as needed for chest [name]. 25 tablet 6  . omeprazole (PRILOSEC) 20 MG capsule Take 20 mg by mouth daily.    . pantoprazole (PROTONIX) 40 MG tablet Take 1 tablet (40 mg total) by mouth daily. 30 tablet 2  . Polyethyl Glycol-Propyl Glycol (SYSTANE) 0.4-0.3 % SOLN Place 1 drop into the right eye daily as needed (dry eyes).    [name] PRESCRIPTION MEDICATION Inhale into the lungs at bedtime. CPAP    . torsemide (DEMADEX) 20 MG tablet Take 40 mg by mouth 2 (two) times daily.    . vitamin E 1000 UNIT capsule Take 1,000 Units by mouth daily.    [name] dronedarone (MULTAQ) 400 MG tablet Take 1 tablet (400 mg total) by mouth 2 (two) times daily with a meal. 60 tablet 3   No current facility-administered medications for this visit.     Allergies:   Adhesive [tape]   Social History:  The patient  reports that she quit smoking about 35 years ago. Her smoking use included cigarettes. She started smoking about 39 years ago. She has a 5.00 pack-year smoking history. she has never used smokeless tobacco. She reports that she drinks  alcohol. She reports that she does not use drugs.   Family History:  The patient's family history includes Arrhythmia in her mother; Diabetes in her mother and paternal grandfather; Heart attack (age of onset: 70) in her brother; Heart disease in her paternal aunt; Hypertension in her mother; Prostate cancer in her maternal grandfather; Suicidality in her father.    ROS:  Please see the history of present illness.   Otherwise, review of systems is positive for fatigue, shortness of breath.   All other systems are reviewed and negative.   PHYSICAL EXAM: VS:  BP 120/82   Pulse 83   Ht 5' 6.5" (1.689 m)   Wt 238 lb (108 kg)   SpO2 96%   BMI 37.84 kg/m  , BMI Body mass index is 37.84 kg/m. GEN: Well nourished, well developed, in no acute distress  HEENT: normal  Neck: no JVD, carotid bruits, or masses Cardiac: iRRR; no murmurs, rubs, or gallops,no edema  Respiratory:  clear to auscultation bilaterally, normal work of breathing GI: soft, nontender, nondistended, + BS MS: no deformity or atrophy  Skin: warm and dry, device site well healed Neuro:  Strength and sensation are intact Psych: euthymic mood, full affect  EKG:  EKG is not ordered today. Personal review of the ekg ordered 05/09/17 shows sinus rhythm, 1dAVB, rate 62  Personal review of the device interrogation today. Results in [place]: 10/16/2016: TSH 4.390 11/12/2016: B Natriuretic Peptide 139.0 11/13/2016: Magnesium 2.1 01/23/2017: ALT 27 05/08/2017: Hemoglobin 13.3; Platelets 338 06/06/2017: BUN 25; Creatinine, Ser 1.10; Potassium 4.1; Sodium 140    Lipid Panel     Component Value Date/Time   CHOL 116 10/16/2016 0850   TRIG 139 10/16/2016 0850   HDL 42 10/16/2016 0850   CHOLHDL 2.8 10/16/2016 0850   CHOLHDL 4.5 03/31/2016 0248   VLDL 29 03/31/2016 0248   LDLCALC 46 10/16/2016 0850     Wt Readings from Last 3 Encounters:  09/07/17 238 lb (108 kg)  08/06/17 237 lb (107.5 kg)  05/29/17 241 lb 12.8 oz  (109.7 kg)      Other studies Reviewed: Additional studies/ records that were reviewed today include: TTE 03/28/16  Review of the above records today demonstrates:  -  Left ventricle: The cavity size was normal. Wall thickness was   increased in a pattern of mild LVH. Systolic function was normal.   The estimated ejection fraction was in the range of 55% to 60%.   Indeterminant diastolic function (atrial fibrillation). Wall   motion was normal; there were no regional wall motion   abnormalities. - Aortic valve: There was no stenosis. - Mitral valve: Mildly calcified annulus. There was no significant   regurgitation. - Left atrium: The atrium was mildly dilated. - Right ventricle: The cavity size was normal. Pacer wire or   catheter noted in right ventricle. Systolic function was normal. - Tricuspid valve: Peak RV-RA gradient (S): 22 mm Hg. - Pulmonary arteries: PA peak pressure: 30 mm Hg (S). - Systemic veins: IVC measured 2.3 cm with normal respirophasic   variation, suggesting RA pressure 8 mmHg.  LHC/RHC 05/09/17 1.  Single-vessel coronary artery disease with continued patency of the stented segment in the right coronary artery and chronic occlusion of a small acute marginal branch collateralized by the left coronary artery 2.  Patent left main, LAD, and left circumflex without significant stenosis 3.  Vigorous LV systolic function with mildly elevated LVEDP 4.  Severe pulmonary hypertension with mean pulmonary artery pressure of 48 mmHg.  Because of high cardiac output (8.5 L/min), the patient's pulmonary vascular resistance is only mildly increased at 3 [place] units.  ASSESSMENT AND PLAN:  1.  Persistent atrial fibrillation: Post atrial fibrillation ablation 09/29/16.  She is currently on Eliquis.  She unfortunately went back into atrial fibrillation in December and has been persistent since that time.  She is having symptoms of shortness of breath and fatigue.  We Yaakov Saindon plan for  cardioversion to see if this Garek Schuneman help improve her symptoms.  After cardioversion, we Gianella Chismar start her on dronedarone.  I did discuss with her the possibility of repeat ablation which we Jamez Ambrocio further assess at the next visit.    This patients CHA2DS2-VASc Score and unadjusted Ischemic Stroke Rate (% per year) is equal to 3.2 % stroke rate/year from a score of 3  Above score calculated as 1 point each if present [CHF, HTN, DM, Vascular=MI/PAD/Aortic Plaque, Age if 65-74, or Female] Above score calculated as 2 points each if present [Age > 75, or Stroke/TIA/TE]  2. Hypertension: Currently well controlled.  No changes.  3. Obesity: Weight loss encouraged  4. Chronic diastolic heart failure: No symptoms of volume overload  5. Obstructive sleep apnea: Encouraged CPAP compliance   Current medicines are reviewed at length with the patient today.   The patient does not have concerns regarding her medicines.  The following changes were made today: Multaq  Labs/ tests ordered today include:  Orders Placed This Encounter  Procedures  . Basic Metabolic Panel (BMET)  . CBC w/Diff     Disposition:   FU with Reynol Arnone 3 months  Signed, Katlyne Nishida Meredith Leeds, [name]  09/07/2017 11:24 AM     [facility] [address] [place]  [name] [address] [phone] (office) [phone] (fax)

## 2017-09-05 NOTE — H&P (View-Only) (Signed)
Electrophysiology Office Note   Date:  09/07/2017   ID:  [name], DOB 10-05-1945, MRN [id number]  PCP:  [name], MD  Primary Electrophysiologist: Gaye Alken, MD    Chief Complaint  Patient presents with  . Pacemaker Check    PAF/Sinus node dysfunction  . Shortness of Breath     History of Present Illness: [name] is a 72 y.o. female who presents today for electrophysiology evaluation.   She has a history of coronary artery disease status post stent to the RCA in 2004, CHF, atrial fibrillation with tachybradycardia syndrome status post pacemaker, CKD, hypertension, hyperlipidemia, inferior wall MI, sleep apnea on CPAP, stroke. She is maintaining sinus rhythm on CPAP and dofetilide until she got an infection and when in atrial fibrillation. Had ablation on 09/29/16.  Today, denies symptoms of palpitations, chest pain, orthopnea, PND, lower extremity edema, claudication, dizziness, presyncope, syncope, bleeding, or neurologic sequela. The patient is tolerating medications without difficulties.  In complaint today is of shortness of breath and fatigue.  On device interrogation, he went back into atrial fibrillation in December.  She has been in atrial fibrillation since that time.   Past Medical History:  Diagnosis Date  . Anticoagulant long-term use    pradaxa  . Anxiety   . Arthritis    "fingers, lower back" (04/23/2017   . CAD (coronary artery disease) 0076,2263   post PTCA with bare-metal stenting to mid RCA in December 2004     . CHF (congestive heart failure) ([place])   . Chronic atrial fibrillation ([place]) 06/2007   Tachybradycardia pacemaker  . Chronic kidney disease    10% function - ?R, other kidney is compensating    . CVA (cerebral vascular accident) [facility]) [phone]   denies residual on 04/23/2017  . Depression   . Diplopia 06/19/2008   Qualifier: Diagnosis of  By: [name] MD, [name]   . Dysrhythmia    ATRIAL FIBRILATION  . Edema of  lower extremity   . Hyperlipidemia   . Hypertension   . Inferior myocardial infarction [facility])    acute inferior wall mi/other medical hx  . Myocardial infarction ([place]) [id number]  . OSA on CPAP    last test- 2010  . Pacemaker   . Pneumonia 2014   tx. ----  [facility]  . Pulmonary hypertension ([place])    moderate pulmonary hypertension by 10/2016 echo and 10/2013 cardiac cath  . Shortness of breath   . Skin cancer    "cut off right [place]; burned off LLE" (04/23/2017)  . Spondylolisthesis   . TIA (transient ischemic attack) 2008  . Unspecified hemorrhoids without mention of complication 6/38/9373   Colonoscopy--Dr. Carlean Purl    Past Surgical History:  Procedure Laterality Date  . APPENDECTOMY  1984  . ATRIAL FIBRILLATION ABLATION N/A 09/29/2016   Procedure: Atrial Fibrillation Ablation;  Surgeon: Sahasra Belue Meredith Leeds, MD;  Location: [place] CV LAB;  Service: Cardiovascular;  Laterality: N/A;  . BACK SURGERY    . CATARACT EXTRACTION W/ INTRAOCULAR LENS  IMPLANT, BILATERAL Bilateral 01/15/2017- 03/2017  . CORONARY ANGIOPLASTY  X 2  . CORONARY ANGIOPLASTY WITH STENT PLACEMENT  1998; ~ 2007; ?date   "1 stent; replaced stent; not sure when I got the last stent" (04/23/2017)  . DOPPLER ECHOCARDIOGRAPHY  2009  . ELECTROPHYSIOLOGIC STUDY N/A 03/31/2016   Procedure: Cardioversion;  Surgeon: Evans Lance, MD;  Location: [place] CV LAB;  Service: Cardiovascular;  Laterality: N/A;  . ELECTROPHYSIOLOGIC STUDY  N/A 08/04/2016   Procedure: Cardioversion;  Surgeon: Evans Lance, MD;  Location: [place] CV LAB;  Service: Cardiovascular;  Laterality: N/A;  . INSERT / REPLACE / REMOVE PACEMAKER  06/2007  . JOINT REPLACEMENT    . LAPAROSCOPIC CHOLECYSTECTOMY  1994  . LEFT AND RIGHT HEART CATHETERIZATION WITH CORONARY ANGIOGRAM N/A 10/06/2013   Procedure: LEFT AND RIGHT HEART CATHETERIZATION WITH CORONARY ANGIOGRAM;  Surgeon: Troy Sine, MD;  Location: [facility] CATH LAB;  Service: Cardiovascular;   Laterality: N/A;  . LEFT OOPHORECTOMY Left ~ 1989  . POSTERIOR LUMBAR FUSION  2000s - 04/2015 X 3   L3-4; L4-5; L2-3; Dr Trenton Gammon  . RIGHT/LEFT HEART CATH AND CORONARY ANGIOGRAPHY N/A 05/09/2017   Procedure: RIGHT/LEFT HEART CATH AND CORONARY ANGIOGRAPHY;  Surgeon: Sherren Mocha, MD;  Location: [place] [facility];  Service: Cardiovascular;  Laterality: N/A;  . SKIN CANCER EXCISION Right    [place]  . TEE WITHOUT CARDIOVERSION N/A 09/29/2016   Procedure: TRANSESOPHAGEAL ECHOCARDIOGRAM (TEE);  Surgeon: Jerline Pain, MD;  Location: [place];  Service: Cardiovascular;  Laterality: N/A;  . TOTAL ABDOMINAL HYSTERECTOMY  1984   "uterus & right ovary"  . TOTAL KNEE ARTHROPLASTY Left 04/23/2017  . TOTAL KNEE ARTHROPLASTY Left 04/23/2017   Procedure: TOTAL KNEE ARTHROPLASTY;  Surgeon: Frederik Pear, MD;  Location: [place];  Service: Orthopedics;  Laterality: Left;  . ULTRASOUND GUIDANCE FOR VASCULAR ACCESS  05/09/2017   Procedure: Ultrasound Guidance For Vascular Access;  Surgeon: Sherren Mocha, MD;  Location: [place] [facility] LAB;  Service: Cardiovascular;;     Current Outpatient Medications  Medication Sig Dispense Refill  . acetaminophen (TYLENOL) 500 MG tablet Take 1,000 mg by mouth 2 (two) times daily.     . Ascorbic Acid (VITAMIN C) 1000 MG tablet Take 1,000 mg by mouth daily.    [name] atorvastatin (LIPITOR) 40 MG tablet TAKE 1 TABLET BY MOUTH EVERY DAY (Patient taking differently: TAKE 40mg  BY MOUTH EVERY DAY) 90 tablet 2  . Calcium Carbonate-Vitamin D (CALCIUM 600+D PO) Take 1 tablet by mouth daily.    . Cyanocobalamin (B-12) 5000 MCG CAPS Take 5,000 mcg by mouth at bedtime.     [name] diltiazem (CARDIZEM CD) 120 MG 24 hr capsule TAKE 1 CAPSULE (120 MG TOTAL) BY MOUTH DAILY. 30 capsule 11  . ELIQUIS 5 MG TABS tablet Take 5 mg by mouth 2 (two) times daily.  11  . FLUoxetine (PROZAC) 20 MG capsule TAKE ONE CAPSULE BY MOUTH EVERY MORNING 90 capsule 1  . fluticasone (FLONASE) 50 MCG/ACT nasal spray Place  1 spray into both nostrils daily as needed for allergies or rhinitis (before bedtime).     [name] KLOR-CON M20 20 MEQ tablet TAKE 1 TABLET BY MOUTH TWICE A DAY (Patient taking differently: TAKE 20MEq BY MOUTH TWICE A DAY) 180 tablet 3  . loratadine (CLARITIN) 10 MG tablet TAKE 1 TABLET BY MOUTH EVERY DAY 30 tablet 5  . Lysine 1000 MG TABS Take 1,000 mg by mouth daily.    . metoprolol succinate (TOPROL-XL) 50 MG 24 hr tablet Take 1 tablet (50 mg total) by mouth 2 (two) times daily. Take with or immediately following a meal. (Patient taking differently: Take 50 mg by mouth 2 (two) times daily. Take with or immediately following a meal. Verified succinate twice daily) 60 tablet 11  . Multiple Vitamin (MULTIVITAMIN WITH MINERALS) TABS tablet Take 1 tablet by mouth daily. Centrum    . nitroGLYCERIN (NITROSTAT) 0.4 MG SL tablet Place 1 tablet (  0.4 mg total) under the tongue every 5 (five) minutes x 3 doses as needed for chest pain. 25 tablet 6  . omeprazole (PRILOSEC) 20 MG capsule Take 20 mg by mouth daily.    . pantoprazole (PROTONIX) 40 MG tablet Take 1 tablet (40 mg total) by mouth daily. 30 tablet 2  . Polyethyl Glycol-Propyl Glycol (SYSTANE) 0.4-0.3 % SOLN Place 1 drop into the right eye daily as needed (dry eyes).    [name] PRESCRIPTION MEDICATION Inhale into the lungs at bedtime. CPAP    . torsemide (DEMADEX) 20 MG tablet Take 40 mg by mouth 2 (two) times daily.    . vitamin E 1000 UNIT capsule Take 1,000 Units by mouth daily.    [name] dronedarone (MULTAQ) 400 MG tablet Take 1 tablet (400 mg total) by mouth 2 (two) times daily with a meal. 60 tablet 3   No current facility-administered medications for this visit.     Allergies:   Adhesive [tape]   Social History:  The patient  reports that she quit smoking about 35 years ago. Her smoking use included cigarettes. She started smoking about 39 years ago. She has a 5.00 pack-year smoking history. she has never used smokeless tobacco. She reports that she drinks  alcohol. She reports that she does not use drugs.   Family History:  The patient's family history includes Arrhythmia in her mother; Diabetes in her mother and paternal grandfather; Heart attack (age of onset: 23) in her brother; Heart disease in her paternal aunt; Hypertension in her mother; Prostate cancer in her maternal grandfather; Suicidality in her father.    ROS:  Please see the history of present illness.   Otherwise, review of systems is positive for fatigue, shortness of breath.   All other systems are reviewed and negative.   PHYSICAL EXAM: VS:  BP 120/82   Pulse 83   Ht 5' 6.5" (1.689 m)   Wt 238 lb (108 kg)   SpO2 96%   BMI 37.84 kg/m  , BMI Body mass index is 37.84 kg/m. GEN: Well nourished, well developed, in no acute distress  HEENT: normal  Neck: no JVD, carotid bruits, or masses Cardiac: iRRR; no murmurs, rubs, or gallops,no edema  Respiratory:  clear to auscultation bilaterally, normal work of breathing GI: soft, nontender, nondistended, + BS MS: no deformity or atrophy  Skin: warm and dry, device site well healed Neuro:  Strength and sensation are intact Psych: euthymic mood, full affect  EKG:  EKG is not ordered today. Personal review of the ekg ordered 05/09/17 shows sinus rhythm, 1dAVB, rate 62  Personal review of the device interrogation today. Results in [place]: 10/16/2016: TSH 4.390 11/12/2016: B Natriuretic Peptide 139.0 11/13/2016: Magnesium 2.1 01/23/2017: ALT 27 05/08/2017: Hemoglobin 13.3; Platelets 338 06/06/2017: BUN 25; Creatinine, Ser 1.10; Potassium 4.1; Sodium 140    Lipid Panel     Component Value Date/Time   CHOL 116 10/16/2016 0850   TRIG 139 10/16/2016 0850   HDL 42 10/16/2016 0850   CHOLHDL 2.8 10/16/2016 0850   CHOLHDL 4.5 03/31/2016 0248   VLDL 29 03/31/2016 0248   LDLCALC 46 10/16/2016 0850     Wt Readings from Last 3 Encounters:  09/07/17 238 lb (108 kg)  08/06/17 237 lb (107.5 kg)  05/29/17 241 lb 12.8 oz  (109.7 kg)      Other studies Reviewed: Additional studies/ records that were reviewed today include: TTE 03/28/16  Review of the above records today demonstrates:  -  Left ventricle: The cavity size was normal. Wall thickness was   increased in a pattern of mild LVH. Systolic function was normal.   The estimated ejection fraction was in the range of 55% to 60%.   Indeterminant diastolic function (atrial fibrillation). Wall   motion was normal; there were no regional wall motion   abnormalities. - Aortic valve: There was no stenosis. - Mitral valve: Mildly calcified annulus. There was no significant   regurgitation. - Left atrium: The atrium was mildly dilated. - Right ventricle: The cavity size was normal. Pacer wire or   catheter noted in right ventricle. Systolic function was normal. - Tricuspid valve: Peak RV-RA gradient (S): 22 mm Hg. - Pulmonary arteries: PA peak pressure: 30 mm Hg (S). - Systemic veins: IVC measured 2.3 cm with normal respirophasic   variation, suggesting RA pressure 8 mmHg.  LHC/RHC 05/09/17 1.  Single-vessel coronary artery disease with continued patency of the stented segment in the right coronary artery and chronic occlusion of a small acute marginal branch collateralized by the left coronary artery 2.  Patent left main, LAD, and left circumflex without significant stenosis 3.  Vigorous LV systolic function with mildly elevated LVEDP 4.  Severe pulmonary hypertension with mean pulmonary artery pressure of 48 mmHg.  Because of high cardiac output (8.5 L/min), the patient's pulmonary vascular resistance is only mildly increased at 3 Wood units.  ASSESSMENT AND PLAN:  1.  Persistent atrial fibrillation: Post atrial fibrillation ablation 09/29/16.  She is currently on Eliquis.  She unfortunately went back into atrial fibrillation in December and has been persistent since that time.  She is having symptoms of shortness of breath and fatigue.  We Toriana Sponsel plan for  cardioversion to see if this Nefertiti Mohamad help improve her symptoms.  After cardioversion, we Axie Hayne start her on dronedarone.  I did discuss with her the possibility of repeat ablation which we Mitchelle Goerner further assess at the next visit.    This patients CHA2DS2-VASc Score and unadjusted Ischemic Stroke Rate (% per year) is equal to 3.2 % stroke rate/year from a score of 3  Above score calculated as 1 point each if present [CHF, HTN, DM, Vascular=MI/PAD/Aortic Plaque, Age if 65-74, or Female] Above score calculated as 2 points each if present [Age > 75, or Stroke/TIA/TE]  2. Hypertension: Currently well controlled.  No changes.  3. Obesity: Weight loss encouraged  4. Chronic diastolic heart failure: No symptoms of volume overload  5. Obstructive sleep apnea: Encouraged CPAP compliance   Current medicines are reviewed at length with the patient today.   The patient does not have concerns regarding her medicines.  The following changes were made today: Multaq  Labs/ tests ordered today include:  Orders Placed This Encounter  Procedures  . Basic Metabolic Panel (BMET)  . CBC w/Diff     Disposition:   FU with Caelum Federici 3 months  Signed, Jamario Colina Meredith Leeds, MD  09/07/2017 11:24 AM     [facility] [address] [place] [address] [phone] (office) [phone] (fax)

## 2017-09-07 ENCOUNTER — Ambulatory Visit (INDEPENDENT_AMBULATORY_CARE_PROVIDER_SITE_OTHER): Payer: PPO | Admitting: Cardiology

## 2017-09-07 ENCOUNTER — Encounter: Payer: Self-pay | Admitting: Cardiology

## 2017-09-07 ENCOUNTER — Telehealth: Payer: Self-pay

## 2017-09-07 ENCOUNTER — Telehealth: Payer: Self-pay | Admitting: Cardiology

## 2017-09-07 ENCOUNTER — Other Ambulatory Visit: Payer: Self-pay | Admitting: Cardiology

## 2017-09-07 VITALS — BP 120/82 | HR 83 | Ht 66.5 in | Wt 238.0 lb

## 2017-09-07 DIAGNOSIS — I5032 Chronic diastolic (congestive) heart failure: Secondary | ICD-10-CM

## 2017-09-07 DIAGNOSIS — Z01812 Encounter for preprocedural laboratory examination: Secondary | ICD-10-CM

## 2017-09-07 DIAGNOSIS — I4819 Other persistent atrial fibrillation: Secondary | ICD-10-CM

## 2017-09-07 DIAGNOSIS — G4733 Obstructive sleep apnea (adult) (pediatric): Secondary | ICD-10-CM | POA: Diagnosis not present

## 2017-09-07 DIAGNOSIS — I10 Essential (primary) hypertension: Secondary | ICD-10-CM | POA: Diagnosis not present

## 2017-09-07 DIAGNOSIS — I481 Persistent atrial fibrillation: Secondary | ICD-10-CM

## 2017-09-07 LAB — BASIC METABOLIC PANEL
BUN/Creatinine Ratio: 22 (ref 12–28)
BUN: 26 mg/dL (ref 8–27)
CO2: 25 mmol/L (ref 20–29)
Calcium: 9.4 mg/dL (ref 8.7–10.3)
Chloride: 101 mmol/L (ref 96–106)
Creatinine, Ser: 1.19 mg/dL — ABNORMAL HIGH (ref 0.57–1.00)
GFR calc Af Amer: 53 mL/min/{1.73_m2} — ABNORMAL LOW (ref >59)
GFR calc non Af Amer: 46 mL/min/{1.73_m2} — ABNORMAL LOW (ref >59)
Glucose: 102 mg/dL — ABNORMAL HIGH (ref 65–99)
Potassium: 4.1 mmol/L (ref 3.5–5.2)
Sodium: 141 mmol/L (ref 134–144)

## 2017-09-07 LAB — CUP PACEART INCLINIC DEVICE CHECK
Date Time Interrogation Session: 20190308164810
Implantable Lead Implant Date: 20081211
Implantable Lead Implant Date: 20081211
Implantable Lead Location: 753859
Implantable Lead Location: 753860
Implantable Pulse Generator Implant Date: 20081211
Lead Channel Setting Pacing Amplitude: 2 V
Lead Channel Setting Pacing Amplitude: 2.5 V
Lead Channel Setting Pacing Pulse Width: 1 ms
Lead Channel Setting Sensing Sensitivity: 2 mV
Pulse Gen Model: 5826
Pulse Gen Serial Number: 1999089

## 2017-09-07 LAB — CBC WITH DIFFERENTIAL/PLATELET
Basophils Absolute: 0 10*3/uL (ref 0.0–0.2)
Basos: 0 %
EOS (ABSOLUTE): 0.3 10*3/uL (ref 0.0–0.4)
Eos: 4 %
Hematocrit: 44.4 % (ref 34.0–46.6)
Hemoglobin: 14.9 g/dL (ref 11.1–15.9)
Immature Grans (Abs): 0 10*3/uL (ref 0.0–0.1)
Immature Granulocytes: 0 %
Lymphocytes Absolute: 1.9 10*3/uL (ref 0.7–3.1)
Lymphs: 27 %
MCH: 31.2 pg (ref 26.6–33.0)
MCHC: 33.6 g/dL (ref 31.5–35.7)
MCV: 93 fL (ref 79–97)
Monocytes Absolute: 0.6 10*3/uL (ref 0.1–0.9)
Monocytes: 9 %
Neutrophils Absolute: 4.2 10*3/uL (ref 1.4–7.0)
Neutrophils: 60 %
Platelets: 280 10*3/uL (ref 150–379)
RBC: 4.78 x10E6/uL (ref 3.77–5.28)
RDW: 14.1 % (ref 12.3–15.4)
WBC: 7.1 10*3/uL (ref 3.4–10.8)

## 2017-09-07 MED ORDER — DRONEDARONE HCL 400 MG PO TABS: 400.0000 mg | ORAL_TABLET | Freq: Two times a day (BID) | ORAL | 3 refills | Status: DC

## 2017-09-07 MED ORDER — METOPROLOL SUCCINATE ER 50 MG PO TB24: 50.0000 mg | ORAL_TABLET | Freq: Two times a day (BID) | ORAL | 3 refills | Status: DC

## 2017-09-07 NOTE — Progress Notes (Signed)
LVM 09/07/17 EDH

## 2017-09-07 NOTE — Telephone Encounter (Signed)
**Note De-Identified Loise Esguerra Obfuscation** I attempted to do a Multaq PA through covermymeds but was unable. I then called [employer] and was advised that a PA cannot be done over the phone either and that I will need to go to [email or web address] and print off a PA request. I did as advised and printed a Prior Authorization Request form from the web site and filled it out. I have faxed it along with Dr Macky Lower OV notes from today to [place] at [phone].

## 2017-09-07 NOTE — Patient Instructions (Addendum)
Medication Instructions:  Your physician has recommended you make the following change in your medication:  1. START Multaq 400 mg twice daily -- YOU WILL START THIS MEDICATION THE EVENING OF 09/12/17  * If you need a refill on your cardiac medications before your next appointment, please call your pharmacy. *  Labwork: Pre procedure labs today: BMET & CBC w/ diff  Testing/Procedures: Your physician has recommended that you have a Cardioversion (DCCV). Electrical Cardioversion uses a jolt of electricity to your heart either through paddles or wired patches attached to your chest. This is a controlled, usually prescheduled, procedure. Defibrillation is done under light anesthesia in the hospital, and you usually go home the day of the procedure. This is done to get your heart back into a normal rhythm. You are not awake for the procedure.   You are scheduled for 09/12/2017  Please arrive at [facility] at 9:00 am  Nothing to eat or drink after midnight  You may take your morning medications with a sip of water  You will need someone to drive you home after this procedure   Follow-Up: Your physician recommends that you schedule a follow-up appointment in: 3 months with Dr. Curt Bears.   Thank you for choosing [facility]!!   Trinidad Curet, RN [phone]  Any Other Special Instructions Will Be Listed Below (If Applicable).   Electrical Cardioversion Electrical cardioversion is the delivery of a jolt of electricity to restore a normal rhythm to the heart. A rhythm that is too fast or is not regular keeps the heart from pumping well. In this procedure, sticky patches or metal paddles are placed on the chest to deliver electricity to the heart from a device. This procedure may be done in an emergency if:  There is low or no blood pressure as a result of the heart rhythm.  Normal rhythm must be restored as fast as possible to protect the brain and heart from further  damage.  It may save a life.  This procedure may also be done for irregular or fast heart rhythms that are not immediately life-threatening. Tell a health care provider about:  Any allergies you have.  All medicines you are taking, including vitamins, herbs, eye drops, creams, and over-the-counter medicines.  Any problems you or family members have had with anesthetic medicines.  Any blood disorders you have.  Any surgeries you have had.  Any medical conditions you have.  Whether you are pregnant or may be pregnant. What are the risks? Generally, this is a safe procedure. However, problems may occur, including:  Allergic reactions to medicines.  A blood clot that breaks free and travels to other parts of your body.  The possible return of an abnormal heart rhythm within hours or days after the procedure.  Your heart stopping (cardiac arrest). This is rare.  What happens before the procedure? Medicines  Your health care provider may have you start taking: ? Blood-thinning medicines (anticoagulants) so your blood does not clot as easily. ? Medicines may be given to help stabilize your heart rate and rhythm.  Ask your health care provider about changing or stopping your regular medicines. This is especially important if you are taking diabetes medicines or blood thinners. General instructions  Plan to have someone take you home from the hospital or clinic.  If you will be going home right after the procedure, plan to have someone with you for 24 hours.  Follow instructions from your health care provider about eating  or drinking restrictions. What happens during the procedure?  To lower your risk of infection: ? Your health care team will wash or sanitize their hands. ? Your skin will be washed with soap.  An IV tube will be inserted into one of your veins.  You will be given a medicine to help you relax (sedative).  Sticky patches (electrodes) or metal paddles  may be placed on your chest.  An electrical shock will be delivered. The procedure may vary among health care providers and hospitals. What happens after the procedure?  Your blood pressure, heart rate, breathing rate, and blood oxygen level will be monitored until the medicines you were given have worn off.  Do not drive for 24 hours if you were given a sedative.  Your heart rhythm will be watched to make sure it does not change. This information is not intended to replace advice given to you by your health care provider. Make sure you discuss any questions you have with your health care provider. Document Released: 06/09/2002 Document Revised: 02/16/2016 Document Reviewed: 12/24/2015 Elsevier Interactive Patient Education  2017 Elsevier Inc.   Dronedarone tablets What is this medicine? DRONEDARONE (droe NE da rone) is an antiarrhythmic drug. It helps make your heart beat regularly. This medicine may be used for other purposes; ask your health care provider or pharmacist if you have questions. COMMON BRAND NAME(S): Multaq What should I tell my health care provider before I take this medicine? They need to know if you have any of these conditions: -heart failure -history of irregular heartbeat -liver disease -liver or lung problems with the past use of amiodarone -low levels of magnesium in the blood -low levels of potassium in the blood -other heart disease -an unusual or allergic reaction to dronedarone, other medicines, foods, dyes, or preservatives -pregnant or trying to get pregnant -breast-feeding How should I use this medicine? Take this medicine by mouth with a glass of water. Follow the directions on the prescription label. Take one tablet with the morning meal and one tablet with the evening meal. Do not take your medicine more often than directed. Do not stop taking except on the advice of your doctor or health care professional. A special MedGuide will be given to you  by the pharmacist with each prescription and refill. Be sure to read this information carefully each time. Talk to your pediatrician regarding the use of this medicine in children. Special care may be needed. Overdosage: If you think you have taken too much of this medicine contact a poison control center or emergency room at once. NOTE: This medicine is only for you. Do not share this medicine with others. What if I miss a dose? If you miss a dose, take it as soon as you can. If it is almost time for your next dose, take only that dose. Do not take double or extra doses. What may interact with this medicine? Do not take this medicine with any of the following medications: -arsenic trioxide -certain antibiotics like clarithromycin, erythromycin, pentamidine, telithromycin, troleandomycin -certain medicines for depression like tricyclic antidepressants -certain medicines for fungal infections like fluconazole, itraconazole, ketoconazole, posaconazole, voriconazole -certain medicines for irregular heart beat like amiodarone, disopyramide, dofetilide, flecainide, ibutilide, quinidine, propafenone, sotalol -certain medicines for malaria like chloroquine, halofantrine -cisapride -cyclosporine -droperidol -haloperidol -methadone -other medicines that prolong the QT interval (cause an abnormal heart rhythm) -pimozide -nefazodone -phenothiazines like chlorpromazine, mesoridazine, prochlorperazine, thioridazine -ritonavir -ziprasidone This medicine may also interact with the following medications: -certain medicines  for blood pressure, heart disease, or irregular heart beat like diltiazem, metoprolol, propranolol, verapamil -certain medicines for cholesterol like atorvastatin, lovastatin, simvastatin -certain medicines for seizures like carbamazepine, phenobarbital, phenytoin -digoxin -grapefruit juice -rifampin -sirolimus -St. John's Wort -tacrolimus This list may not describe all possible  interactions. Give your health care provider a list of all the medicines, herbs, non-prescription drugs, or dietary supplements you use. Also tell them if you smoke, drink alcohol, or use illegal drugs. Some items may interact with your medicine. What should I watch for while using this medicine? Your condition will be monitored closely when you first begin therapy. Often, this drug is first started in a hospital or other monitored health care setting. Once you are on maintenance therapy, visit your doctor or health care professional for regular checks on your progress. Because your condition and use of this medicine carry some risk, it is a good idea to carry an identification card, necklace or bracelet with details of your condition, medications, and doctor or health care professional. Dennis Bast may get drowsy or dizzy. Do not drive, use machinery, or do anything that needs mental alertness until you know how this medicine affects you. Do not stand or sit up quickly, especially if you are an older patient. This reduces the risk of dizzy or fainting spells. What side effects may I notice from receiving this medicine? Side effects that you should report to your doctor or health care professional as soon as possible: -allergic reactions like skin rash, itching or hives, swelling of the face, lips, or tongue -breathing problems -cough -dark urine -fast, irregular heartbeat -general ill feeling or flu-like symptoms -light-colored stools -loss of appetite, nausea -right upper belly pain -slow heartbeat -stomach pain -swelling of the legs or ankles -unusually weak or tired -weight gain -yellowing of the eyes or skin Side effects that usually do not require medical attention (report to your doctor or health care professional if they continue or are bothersome): -nausea -vomiting -stomach pain This list may not describe all possible side effects. Call your doctor for medical advice about side effects.  You may report side effects to FDA at 1-800-FDA-1088. Where should I keep my medicine? Keep out of the reach of children. Store at room temperature between 15 and 30 degrees C (59 and 86 degrees F). Throw away any unused medicine after the expiration date. NOTE: This sheet is a summary. It may not cover all possible information. If you have questions about this medicine, talk to your doctor, pharmacist, or health care provider.  2018 Elsevier/Gold Standard (2015-07-22 12:43:06)

## 2017-09-07 NOTE — Telephone Encounter (Signed)
Reports medication is going to cost $600/mo Advised patient to stop by the office next week - 30 days of samples left at front desk. Informed pt office would work on getting cost reduced through insurance. She understands office will update her once insurance has responded to our request. Patient verbalized understanding and agreeable to plan.

## 2017-09-07 NOTE — Addendum Note (Signed)
Addended by: Stanton Kidney on: 09/07/2017 11:31 AM   Modules accepted: Orders

## 2017-09-07 NOTE — Telephone Encounter (Signed)
Patient calling, states that the medication that was prescribed at office visit today is too costly. Patient would like to know if there is an alternative?

## 2017-09-10 ENCOUNTER — Other Ambulatory Visit: Payer: Self-pay | Admitting: Cardiovascular Disease

## 2017-09-10 NOTE — Telephone Encounter (Signed)
F/U Call:  Sherri calling with [place], states that she needs procedure code for prior-auth

## 2017-09-11 ENCOUNTER — Other Ambulatory Visit: Payer: Self-pay | Admitting: *Deleted

## 2017-09-11 MED ORDER — FLUOXETINE HCL 20 MG PO CAPS: 20.0000 mg | ORAL_CAPSULE | Freq: Every morning | ORAL | 0 refills | Status: DC

## 2017-09-11 NOTE — Telephone Encounter (Signed)
After talking with Sherri at [facility] I attempted again to do a [employer] PA through covermymeds and this time it was successful.

## 2017-09-12 ENCOUNTER — Encounter (HOSPITAL_COMMUNITY): Payer: Self-pay | Admitting: *Deleted

## 2017-09-12 ENCOUNTER — Ambulatory Visit (HOSPITAL_COMMUNITY): Payer: PPO | Admitting: Anesthesiology

## 2017-09-12 ENCOUNTER — Other Ambulatory Visit: Payer: Self-pay

## 2017-09-12 ENCOUNTER — Ambulatory Visit (HOSPITAL_COMMUNITY)
Admission: RE | Admit: 2017-09-12 | Discharge: 2017-09-12 | Disposition: A | Discharge status: Home / Self Care | Payer: PPO | Source: Ambulatory Visit | Attending: Cardiology | Admitting: Cardiology

## 2017-09-12 ENCOUNTER — Encounter (HOSPITAL_COMMUNITY)
Admission: RE | Disposition: A | Discharge status: Home / Self Care | Payer: Self-pay | Source: Ambulatory Visit | Attending: Cardiology

## 2017-09-12 DIAGNOSIS — I480 Paroxysmal atrial fibrillation: Secondary | ICD-10-CM | POA: Insufficient documentation

## 2017-09-12 DIAGNOSIS — Z85828 Personal history of other malignant neoplasm of skin: Secondary | ICD-10-CM | POA: Insufficient documentation

## 2017-09-12 DIAGNOSIS — E1151 Type 2 diabetes mellitus with diabetic peripheral angiopathy without gangrene: Secondary | ICD-10-CM | POA: Insufficient documentation

## 2017-09-12 DIAGNOSIS — M1990 Unspecified osteoarthritis, unspecified site: Secondary | ICD-10-CM | POA: Insufficient documentation

## 2017-09-12 DIAGNOSIS — E785 Hyperlipidemia, unspecified: Secondary | ICD-10-CM | POA: Diagnosis not present

## 2017-09-12 DIAGNOSIS — E669 Obesity, unspecified: Secondary | ICD-10-CM | POA: Insufficient documentation

## 2017-09-12 DIAGNOSIS — I130 Hypertensive heart and chronic kidney disease with heart failure and stage 1 through stage 4 chronic kidney disease, or unspecified chronic kidney disease: Secondary | ICD-10-CM | POA: Insufficient documentation

## 2017-09-12 DIAGNOSIS — Z87891 Personal history of nicotine dependence: Secondary | ICD-10-CM | POA: Insufficient documentation

## 2017-09-12 DIAGNOSIS — N189 Chronic kidney disease, unspecified: Secondary | ICD-10-CM | POA: Insufficient documentation

## 2017-09-12 DIAGNOSIS — E1122 Type 2 diabetes mellitus with diabetic chronic kidney disease: Secondary | ICD-10-CM | POA: Diagnosis not present

## 2017-09-12 DIAGNOSIS — Z955 Presence of coronary angioplasty implant and graft: Secondary | ICD-10-CM | POA: Insufficient documentation

## 2017-09-12 DIAGNOSIS — I509 Heart failure, unspecified: Secondary | ICD-10-CM | POA: Insufficient documentation

## 2017-09-12 DIAGNOSIS — I252 Old myocardial infarction: Secondary | ICD-10-CM | POA: Insufficient documentation

## 2017-09-12 DIAGNOSIS — Z79899 Other long term (current) drug therapy: Secondary | ICD-10-CM | POA: Diagnosis not present

## 2017-09-12 DIAGNOSIS — I481 Persistent atrial fibrillation: Secondary | ICD-10-CM | POA: Insufficient documentation

## 2017-09-12 DIAGNOSIS — I739 Peripheral vascular disease, unspecified: Secondary | ICD-10-CM | POA: Diagnosis not present

## 2017-09-12 DIAGNOSIS — Z96652 Presence of left artificial knee joint: Secondary | ICD-10-CM | POA: Diagnosis not present

## 2017-09-12 DIAGNOSIS — Z8673 Personal history of transient ischemic attack (TIA), and cerebral infarction without residual deficits: Secondary | ICD-10-CM | POA: Diagnosis not present

## 2017-09-12 DIAGNOSIS — I499 Cardiac arrhythmia, unspecified: Secondary | ICD-10-CM | POA: Diagnosis not present

## 2017-09-12 DIAGNOSIS — G4733 Obstructive sleep apnea (adult) (pediatric): Secondary | ICD-10-CM | POA: Diagnosis not present

## 2017-09-12 DIAGNOSIS — I4891 Unspecified atrial fibrillation: Secondary | ICD-10-CM | POA: Diagnosis not present

## 2017-09-12 DIAGNOSIS — I2510 Atherosclerotic heart disease of native coronary artery without angina pectoris: Secondary | ICD-10-CM | POA: Diagnosis not present

## 2017-09-12 HISTORY — PX: CARDIOVERSION: SHX1299

## 2017-09-12 SURGERY — CARDIOVERSION
Anesthesia: General

## 2017-09-12 MED ORDER — PROPOFOL 10 MG/ML IV BOLUS
INTRAVENOUS | Status: DC | PRN
  Administered 2017-09-12: 80 mg via INTRAVENOUS

## 2017-09-12 MED ORDER — LIDOCAINE HCL (CARDIAC) 20 MG/ML IV SOLN
INTRAVENOUS | Status: DC | PRN
  Administered 2017-09-12: 100 mg via INTRAVENOUS

## 2017-09-12 MED ORDER — SODIUM CHLORIDE 0.9 % IV SOLN
INTRAVENOUS | Status: DC
  Administered 2017-09-12: 500 mL via INTRAVENOUS

## 2017-09-12 MED ORDER — PHENYLEPHRINE HCL 10 MG/ML IJ SOLN
INTRAMUSCULAR | Status: DC | PRN
  Administered 2017-09-12: 20 ug via INTRAVENOUS

## 2017-09-12 NOTE — Anesthesia Preprocedure Evaluation (Signed)
Anesthesia Evaluation  Patient identified by MRN, date of birth, ID band Patient awake    Reviewed: Allergy & Precautions, NPO status , Patient's Chart, lab work & pertinent test results, reviewed documented beta blocker date and time   History of Anesthesia Complications Negative for: history of anesthetic complications  Airway Mallampati: II  TM Distance: >3 FB Neck ROM: Full    Dental  (+) Chipped, Dental Advisory Given   Pulmonary shortness of breath, sleep apnea and Continuous Positive Airway Pressure Ventilation , pneumonia, former smoker,    breath sounds clear to auscultation       Cardiovascular hypertension, Pt. on medications and Pt. on home beta blockers + CAD, + Past MI, + Cardiac Stents and + Peripheral Vascular Disease  + dysrhythmias Atrial Fibrillation + pacemaker  Rhythm:Regular Rate:Normal  4/18 ECHO:  - Left ventricle: The cavity size was normal. There was moderate concentric hypertrophy. Systolic function was normal. The estimated ejection fraction was in the range of 55% to 60%. Wall motion was normal; there were no regional wall motion abnormalities. Features are consistent with a pseudonormal left ventricular filling pattern, with concomitant abnormal relaxation and increased filling pressure (grade 2 diastolic dysfunction). Doppler parameters are consistent with high ventricular filling   pressure. - Aortic valve: Trileaflet; mildly thickened, mildly calcified   leaflets. - Mitral valve: Calcified annulus. There was mild regurgitation. - Left atrium: The atrium was moderately dilated. - Pulmonary arteries: PA peak pressure: 53 mm Hg (S).  Impressions: - The right ventricular systolic pressure was increased consistent with moderate pulmonary hypertension.   Neuro/Psych Anxiety Depression TIACVA (vision changes), Residual Symptoms    GI/Hepatic Neg liver ROS, GERD  Controlled,  Endo/Other  diabetesMorbid  obesity  Renal/GU Renal disease     Musculoskeletal  (+) Arthritis , Osteoarthritis,    Abdominal (+) + obese,   Peds  Hematology  (+) anemia , eliquis   Anesthesia Other Findings   Reproductive/Obstetrics                             Anesthesia Physical  Anesthesia Plan  ASA: III  Anesthesia Plan: General   Post-op Pain Management:    Induction:   PONV Risk Score and Plan: 3 and Treatment may vary due to age or medical condition  Airway Management Planned: Mask and Natural Airway  Additional Equipment:   Intra-op Plan:   Post-operative Plan:   Informed Consent: I have reviewed the patients History and Physical, chart, labs and discussed the procedure including the risks, benefits and alternatives for the proposed anesthesia with the patient or authorized representative who has indicated his/her understanding and acceptance.   Dental advisory given  Plan Discussed with: CRNA  Anesthesia Plan Comments:         Anesthesia Quick Evaluation

## 2017-09-12 NOTE — CV Procedure (Signed)
    Electrical Cardioversion Procedure Note [name] [id number] 08/30/1945  Procedure: Electrical Cardioversion Indications:  Atrial Fibrillation  Time Out: Verified patient identification, verified procedure,medications/allergies/relevent history reviewed, required imaging and test results available.  Performed  Procedure Details  The patient was NPO after midnight. Anesthesia was administered at the beside  by Dr.Geremoth with 80mg  of propofol.  Cardioversion was performed with synchronized biphasic defibrillation via AP pads with 120 joules.  1 attempt(s) were performed.  The patient converted to normal sinus rhythm. The patient tolerated the procedure well   IMPRESSION:  Successful cardioversion of atrial fibrillation. Pacemaker normal function.    [name] 09/12/2017, 10:16 AM

## 2017-09-12 NOTE — Interval H&P Note (Signed)
History and Physical Interval Note:  09/12/2017 9:55 AM  [name]  has presented today for surgery, with the diagnosis of AFIB  The various methods of treatment have been discussed with the patient and family. After consideration of risks, benefits and other options for treatment, the patient has consented to  Procedure(s): CARDIOVERSION (N/A) as a surgical intervention .  The patient's history has been reviewed, patient examined, no change in status, stable for surgery.  I have reviewed the patient's chart and labs.  Questions were answered to the patient's satisfaction.     [employer]

## 2017-09-12 NOTE — Anesthesia Postprocedure Evaluation (Signed)
Anesthesia Post Note  Patient: [name]  Procedure(s) Performed: CARDIOVERSION (N/A )     Patient location during evaluation: PACU Anesthesia Type: General Level of consciousness: sedated and patient cooperative Pain management: pain level controlled Vital Signs Assessment: post-procedure vital signs reviewed and stable Respiratory status: spontaneous breathing Cardiovascular status: stable Anesthetic complications: no    Last Vitals:  Vitals:   09/12/17 1039 09/12/17 1040  BP: 123/69 129/60  Pulse: 68 67  Resp: 19 17  Temp:    SpO2: 95% 96%    Last Pain:  Vitals:   09/12/17 1021  TempSrc: Oral                 Nolon Nations

## 2017-09-12 NOTE — Transfer of Care (Signed)
Immediate Anesthesia Transfer of Care Note  Patient: [name]  Procedure(s) Performed: CARDIOVERSION (N/A )  Patient Location: Endoscopy Unit  Anesthesia Type:General  Level of Consciousness: awake, alert  and oriented  Airway & Oxygen Therapy: Patient Spontanous Breathing and Patient connected to nasal cannula oxygen  Post-op Assessment: Report given to RN and Post -op Vital signs reviewed and stable  Post vital signs: Reviewed and stable  Last Vitals:  Vitals:   09/12/17 1016 09/12/17 1017  BP:    Pulse: 63 64  Resp: (!) 23 (!) 23  Temp:    SpO2: 93% 92%    Last Pain:  Vitals:   09/12/17 0912  TempSrc: Oral         Complications: No apparent anesthesia complications

## 2017-09-12 NOTE — Anesthesia Procedure Notes (Signed)
Procedure Name: General with mask airway Date/Time: 09/12/2017 10:06 AM Performed by: Eligha Bridegroom, CRNA Pre-anesthesia Checklist: Patient identified, Emergency Drugs available, Suction available, Patient being monitored and Timeout performed Patient Re-evaluated:Patient Re-evaluated prior to induction Oxygen Delivery Method: Ambu bag Preoxygenation: Pre-oxygenation with 100% oxygen Induction Type: IV induction Ventilation: Mask ventilation without difficulty

## 2017-09-12 NOTE — Discharge Instructions (Signed)
Electrical Cardioversion, Care After This sheet gives you information about how to care for yourself after your procedure. Your health care provider may also give you more specific instructions. If you have problems or questions, contact your health care provider. What can I expect after the procedure? After the procedure, it is common to have:  Some redness on the skin where the shocks were given.  Follow these instructions at home:  Do not drive for 24 hours if you were given a medicine to help you relax (sedative).  Take over-the-counter and prescription medicines only as told by your health care provider.  Ask your health care provider how to check your pulse. Check it often.  Rest for 48 hours after the procedure or as told by your health care provider.  Avoid or limit your caffeine use as told by your health care provider. Contact a health care provider if:  You feel like your heart is beating too quickly or your pulse is not regular.  You have a serious muscle cramp that does not go away. Get help right away if:  You have discomfort in your chest.  You are dizzy or you feel faint.  You have trouble breathing or you are short of breath.  Your speech is slurred.  You have trouble moving an arm or leg on one side of your body.  Your fingers or toes turn cold or blue. This information is not intended to replace advice given to you by your health care provider. Make sure you discuss any questions you have with your health care provider. Document Released: 04/09/2013 Document Revised: 01/21/2016 Document Reviewed: 12/24/2015 Elsevier Interactive Patient Education  2018 [place], Care After These instructions provide you with information about caring for yourself after your procedure. Your health care provider may also give you more specific instructions. Your treatment has been planned according to current medical practices, but problems  sometimes occur. Call your health care provider if you have any problems or questions after your procedure. What can I expect after the procedure? After your procedure, it is common to:  Feel sleepy for several hours.  Feel clumsy and have poor balance for several hours.  Feel forgetful about what happened after the procedure.  Have poor judgment for several hours.  Feel nauseous or vomit.  Have a sore throat if you had a breathing tube during the procedure.  Follow these instructions at home: For at least 24 hours after the procedure:   Do not: ? Participate in activities in which you could fall or become injured. ? Drive. ? Use heavy machinery. ? Drink alcohol. ? Take sleeping pills or medicines that cause drowsiness. ? Make important decisions or sign legal documents. ? Take care of children on your own.  Rest. Eating and drinking  Follow the diet that is recommended by your health care provider.  If you vomit, drink water, juice, or soup when you can drink without vomiting.  Make sure you have little or no nausea before eating solid foods. General instructions  Have a responsible adult stay with you until you are awake and alert.  Take over-the-counter and prescription medicines only as told by your health care provider.  If you smoke, do not smoke without supervision.  Keep all follow-up visits as told by your health care provider. This is important. Contact a health care provider if:  You keep feeling nauseous or you keep vomiting.  You feel light-headed.  You develop a rash.  You have a fever. Get help right away if:  You have trouble breathing. This information is not intended to replace advice given to you by your health care provider. Make sure you discuss any questions you have with your health care provider. Document Released: 10/10/2015 Document Revised: 02/09/2016 Document Reviewed: 10/10/2015 Elsevier Interactive Patient Education  [employer].

## 2017-09-13 NOTE — Telephone Encounter (Signed)
Received fax from [employer] stating patients MULTAQ was approved dates 09/12/2017-07/02/2018. [facility] notified.

## 2017-09-16 NOTE — Progress Notes (Deleted)
No chief complaint on file.   HPI: [name] 72 y.o. come in for Chronic disease management  ROS: See pertinent positives and negatives per HPI.  Past Medical History:  Diagnosis Date  . Anticoagulant long-term use    pradaxa  . Anxiety   . Arthritis    "fingers, lower back" (04/23/2017   . CAD (coronary artery disease) 3545,6256   post PTCA with bare-metal stenting to mid RCA in December 2004     . CHF (congestive heart failure) ([place])   . Chronic atrial fibrillation ([place]) 06/2007   Tachybradycardia pacemaker  . Chronic kidney disease    10% function - ?R, other kidney is compensating    . CVA (cerebral vascular accident) [facility]) [id number]   denies residual on 04/23/2017  . Depression   . Diplopia 06/19/2008   Qualifier: Diagnosis of  By: [name] MD, [name]   . Dysrhythmia    ATRIAL FIBRILATION  . Edema of lower extremity   . Hyperlipidemia   . Hypertension   . Inferior myocardial infarction [facility])    acute inferior wall mi/other medical hx  . Myocardial infarction ([place]) [id number]  . OSA on CPAP    last test- 2010  . Pacemaker   . Pneumonia 2014   tx. ----  [facility]  . Pulmonary hypertension ([place])    moderate pulmonary hypertension by 10/2016 echo and 10/2013 cardiac cath  . Shortness of breath   . Skin cancer    "cut off right Berens; burned off LLE" (04/23/2017)  . Spondylolisthesis   . TIA (transient ischemic attack) 2008  . Unspecified hemorrhoids without mention of complication 8/76/8115   Colonoscopy--Dr. Carlean Purl     Family History  Problem Relation Age of Onset  . Suicidality Father        suicide death pt was 3 yrs  . Arrhythmia Mother   . Hypertension Mother   . Diabetes Mother   . Heart attack Brother 71  . Heart disease Paternal Aunt   . Prostate cancer Maternal Grandfather   . Diabetes Paternal Grandfather        fathers side of the family    Social History   Socioeconomic History  . Marital status: Married    Spouse name: Not  on file  . Number of children: 0  . Years of education: HS  . Highest education level: Not on file  Social Needs  . Financial resource strain: Not on file  . Food insecurity - worry: Not on file  . Food insecurity - inability: Not on file  . Transportation needs - medical: Not on file  . Transportation needs - non-medical: Not on file  Occupational History  . Occupation: retired    Comment: previously worked [employer]  . Smoking status: Former Smoker    Packs/day: 1.00    Years: 5.00    Pack years: 5.00    Types: Cigarettes    Start date: 05/11/1978    Last attempt to quit: 07/03/1982    Years since quitting: 35.2  . Smokeless tobacco: Never Used  Substance and Sexual Activity  . Alcohol use: Yes    Comment: 04/23/2017 "once q 6 months; glass of wine"  . Drug use: No  . Sexual activity: Not Currently  Other Topics Concern  . 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   Married    Originally from [place] of  two, high school education   Former smoker [phone] 1ppd   Hunting dogs 7    Retired from [employer] 2     Outpatient Medications Prior to Visit  Medication Sig Dispense Refill  . acetaminophen (TYLENOL) 500 MG tablet Take 500 mg by mouth 2 (two) times daily.     . Ascorbic Acid (VITAMIN C) 1000 MG tablet Take 1,000 mg by mouth daily.    [name] atorvastatin (LIPITOR) 40 MG tablet TAKE 1 TABLET BY MOUTH EVERY DAY 90 tablet 3  . Calcium Carbonate-Vitamin D (CALCIUM 600+D PO) Take 1 tablet by mouth daily.    . Cyanocobalamin (B-12) 5000 MCG CAPS Take 5,000 mcg by mouth at bedtime.     [name] diltiazem (CARDIZEM CD) 120 MG 24 hr capsule TAKE 1 CAPSULE (120 MG TOTAL) BY MOUTH DAILY. 30 capsule 11  . dronedarone (MULTAQ) 400 MG tablet Take 1 tablet (400 mg total) by mouth 2 (two) times daily with a meal. (Patient not taking: Reported on 09/10/2017) 60 tablet 3  . ELIQUIS 5  MG TABS tablet Take 5 mg by mouth 2 (two) times daily.  11  . FLUoxetine (PROZAC) 20 MG capsule Take 1 capsule (20 mg total) by mouth every morning. 90 capsule 0  . fluticasone (FLONASE) 50 MCG/ACT nasal spray Place 1 spray into both nostrils at bedtime as needed for allergies or rhinitis.     [name] KLOR-CON M20 20 MEQ tablet TAKE 1 TABLET BY MOUTH TWICE A DAY (Patient taking differently: TAKE 20 MEQ BY MOUTH TWICE A DAY) 180 tablet 3  . loratadine (CLARITIN) 10 MG tablet TAKE 1 TABLET BY MOUTH EVERY DAY (Patient taking differently: TAKE 10 MG BY MOUTH EVERY DAY) 30 tablet 5  . Lysine 1000 MG TABS Take 1,000 mg by mouth daily.    . metoprolol succinate (TOPROL-XL) 50 MG 24 hr tablet Take 1 tablet (50 mg total) by mouth 2 (two) times daily. Take with or immediately following a meal. 180 tablet 3  . Multiple Vitamin (MULTIVITAMIN WITH MINERALS) TABS tablet Take 1 tablet by mouth daily. Centrum    . nitroGLYCERIN (NITROSTAT) 0.4 MG SL tablet Place 1 tablet (0.4 mg total) under the tongue every 5 (five) minutes x 3 doses as needed for chest pain. 25 tablet 6  . omeprazole (PRILOSEC) 20 MG capsule Take 20 mg by mouth daily.    Vladimir Faster Glycol-Propyl Glycol (SYSTANE) 0.4-0.3 % SOLN Place 1 drop into the right eye daily as needed (dry eyes).    . torsemide (DEMADEX) 20 MG tablet Take 40 mg by mouth 2 (two) times daily.    . vitamin E 1000 UNIT capsule Take 1,000 Units by mouth daily.     No facility-administered medications prior to visit.      EXAM:  There were no vitals taken for this visit.  There is no height or weight on file to calculate BMI.  GENERAL: vitals reviewed and listed above, alert, oriented, appears well hydrated and in no acute distress HEENT: atraumatic, conjunctiva  clear, no obvious abnormalities on inspection of external nose and ears OP : no lesion edema or exudate  NECK: no obvious masses on inspection palpation  LUNGS: clear to auscultation bilaterally, no wheezes, rales  or rhonchi, good air movement CV: HRRR, no clubbing cyanosis or  peripheral edema nl cap refill  MS: moves all extremities without noticeable focal  abnormality PSYCH: pleasant and cooperative, no obvious depression or anxiety Lab Results  Component Value Date   WBC 7.1 09/07/2017   HGB 14.9 09/07/2017   HCT 44.4 09/07/2017   PLT 280 09/07/2017   GLUCOSE 102 (H) 09/07/2017   CHOL 116 10/16/2016   TRIG 139 10/16/2016   HDL 42 10/16/2016   LDLCALC 46 10/16/2016   ALT 27 01/23/2017   AST 19 01/23/2017   NA 141 09/07/2017   K 4.1 09/07/2017   CL 101 09/07/2017   CREATININE 1.19 (H) 09/07/2017   BUN 26 09/07/2017   CO2 25 09/07/2017   TSH 4.390 10/16/2016   INR 1.0 05/08/2017   HGBA1C 6.3 05/15/2017   MICROALBUR <0.7 01/23/2017   BP Readings from Last 3 Encounters:  09/12/17 129/60  09/07/17 120/82  08/06/17 (!) 98/58    ASSESSMENT AND PLAN:  Discussed the following assessment and plan:  Pre-diabetes  Medication management  Essential hypertension  Elevated alkaline phosphatase measurement  Chronic diastolic heart failure- grade 2 diastolic dysfunction by echo 10/03/13  Fatty liver disease, nonalcoholic  Hyperlipidemia, unspecified hyperlipidemia type  PACEMAKER, PERMANENT  Pulmonary hypertension (HCC)  Severe obesity (BMI >= 40) (HCC) Lipids 4 18  Liver 6 mos  a1c   4 mos future orders in record  X a1c Address  Imaging of  Liver . Other  -Patient advised to return or notify health care team  if  new concerns arise.  There are no Patient Instructions on file for this visit.   [name]. Haizlee Henton M.D.

## 2017-09-17 ENCOUNTER — Ambulatory Visit: Payer: PPO | Admitting: Internal Medicine

## 2017-09-28 ENCOUNTER — Other Ambulatory Visit: Payer: Self-pay | Admitting: Cardiovascular Disease

## 2017-10-12 ENCOUNTER — Other Ambulatory Visit: Payer: Self-pay | Admitting: Internal Medicine

## 2017-11-06 DIAGNOSIS — Z96652 Presence of left artificial knee joint: Secondary | ICD-10-CM | POA: Diagnosis not present

## 2017-11-06 DIAGNOSIS — M25562 Pain in left knee: Secondary | ICD-10-CM | POA: Diagnosis not present

## 2017-11-13 NOTE — Progress Notes (Signed)
Chief Complaint  Patient presents with  . Follow-up    Pt states that she has been doing well since last OV, no changes    HPI: [name] 72 y.o. come in for Chronic disease management   Since l;ast visit  Had afib and Cv   resp the same . Her mom is now in [facility]  After major illness prev  Hosp.   Has been care taking  Of   Mom 90s  Taking prozac helping .  ROS: See pertinent positives and negatives per HPI. resp the same  On 02 cpapc  Past Medical History:  Diagnosis Date  . Anticoagulant long-term use    pradaxa  . Anxiety   . Arthritis    "fingers, lower back" (04/23/2017   . CAD (coronary artery disease) 4742,5956   post PTCA with bare-metal stenting to mid RCA in December 2004     . CHF (congestive heart failure) ([place])   . Chronic atrial fibrillation ([place]) 06/2007   Tachybradycardia pacemaker  . Chronic kidney disease    10% function - ?R, other kidney is compensating    . CVA (cerebral vascular accident) [facility]) [phone]   denies residual on 04/23/2017  . Depression   . Diplopia 06/19/2008   Qualifier: Diagnosis of  By: [name] MD, [name]   . Dysrhythmia    ATRIAL FIBRILATION  . Edema of lower extremity   . Hyperlipidemia   . Hypertension   . Inferior myocardial infarction [facility])    acute inferior wall mi/other medical hx  . Myocardial infarction ([place]) [id number]  . OSA on CPAP    last test- 2010  . Pacemaker   . Pneumonia 2014   tx. ----  [facility]  . Pulmonary hypertension ([place])    moderate pulmonary hypertension by 10/2016 echo and 10/2013 cardiac cath  . Shortness of breath   . Skin cancer    "cut off right [place]; burned off LLE" (04/23/2017)  . Spondylolisthesis   . TIA (transient ischemic attack) 2008  . Unspecified hemorrhoids without mention of complication 2/95/1884   Colonoscopy--Dr. Carlean Purl     Family History  Problem Relation Age of Onset  . Suicidality Father        suicide death pt was 3 yrs  . Arrhythmia Mother   .  Hypertension Mother   . Diabetes Mother   . Heart attack Brother 51  . Heart disease Paternal Aunt   . Prostate cancer Maternal Grandfather   . Diabetes Paternal Grandfather        fathers side of the family    Social History   Socioeconomic History  . Marital status: Married    Spouse name: Not on file  . Number of children: 0  . Years of education: HS  . Highest education level: Not on file  Occupational History  . Occupation: retired    Comment: previously worked [employer]  . Financial resource strain: Not on file  . Food insecurity:    Worry: Not on file    Inability: Not on file  . Transportation needs:    Medical: Not on file    Non-medical: Not on file  Tobacco Use  . Smoking status: Former Smoker    Packs/day: 1.00    Years: 5.00    Pack years: 5.00    Types: Cigarettes    Start date: 05/11/1978    Last attempt to quit: 07/03/1982    Years since quitting: 35.3  .  Smokeless tobacco: Never Used  Substance and Sexual Activity  . Alcohol use: Yes    Comment: 04/23/2017 "once q 6 months; glass of wine"  . Drug use: No  . Sexual activity: Not Currently  Lifestyle  . Physical activity:    Days per week: Not on file    Minutes per session: Not on file  . Stress: Not on file  Relationships  . Social connections:    Talks on phone: Not on file    Gets together: Not on file    Attends religious service: Not on file    Active member of club or organization: Not on file    Attends meetings of clubs or organizations: Not on file    Relationship status: Not on file  Other Topics Concern  . 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   Married    Originally from [place] of two, high school education   Former smoker [phone]   Hunting dogs 7    Retired from [employer] 2     Outpatient Medications Prior to Visit  Medication Sig Dispense Refill    . acetaminophen (TYLENOL) 500 MG tablet Take 500 mg by mouth 2 (two) times daily.     . Ascorbic Acid (VITAMIN C) 1000 MG tablet Take 1,000 mg by mouth daily.    [name] atorvastatin (LIPITOR) 40 MG tablet TAKE 1 TABLET BY MOUTH EVERY DAY 90 tablet 3  . Calcium Carbonate-Vitamin D (CALCIUM 600+D PO) Take 1 tablet by mouth daily.    . Cyanocobalamin (B-12) 5000 MCG CAPS Take 5,000 mcg by mouth at bedtime.     [name] diltiazem (CARDIZEM CD) 120 MG 24 hr capsule TAKE 1 CAPSULE (120 MG TOTAL) BY MOUTH DAILY. 30 capsule 11  . ELIQUIS 5 MG TABS tablet Take 5 mg by mouth 2 (two) times daily.  11  . FLUoxetine (PROZAC) 20 MG capsule Take 1 capsule (20 mg total) by mouth every morning. 90 capsule 0  . fluticasone (FLONASE) 50 MCG/ACT nasal spray Place 1 spray into both nostrils at bedtime as needed for allergies or rhinitis.     [name] KLOR-CON M20 20 MEQ tablet TAKE 1 TABLET BY MOUTH TWICE A DAY (Patient taking differently: TAKE 20 MEQ BY MOUTH TWICE A DAY) 180 tablet 3  . loratadine (CLARITIN) 10 MG tablet TAKE 1 TABLET BY MOUTH EVERY DAY (Patient taking differently: TAKE 10 MG BY MOUTH EVERY DAY) 30 tablet 5  . Lysine 1000 MG TABS Take 1,000 mg by mouth daily.    . metoprolol succinate (TOPROL-XL) 50 MG 24 hr tablet Take 1 tablet (50 mg total) by mouth 2 (two) times daily. Take with or immediately following a meal. 180 tablet 3  . Multiple Vitamin (MULTIVITAMIN WITH MINERALS) TABS tablet Take 1 tablet by mouth daily. Centrum    . nitroGLYCERIN (NITROSTAT) 0.4 MG SL tablet Place 1 tablet (0.4 mg total) under the tongue every 5 (five) minutes x 3 doses as needed for chest pain. 25 tablet 6  . omeprazole (PRILOSEC) 20 MG capsule Take 20 mg by mouth daily.    Vladimir Faster Glycol-Propyl Glycol (SYSTANE) 0.4-0.3 % SOLN Place 1 drop into the right eye daily as needed (dry eyes).    . torsemide (DEMADEX) 20 MG tablet Take 40 mg by mouth 2 (two) times daily.    . valACYclovir (VALTREX) 1000 MG tablet  TAKE 2 TABLETS BY MOUTH AND  REPEAT IN 12 HOURS FOR COLD SORES 30 tablet 1  . vitamin E 1000 UNIT capsule Take 1,000 Units by mouth daily.    [name] dronedarone (MULTAQ) 400 MG tablet Take 1 tablet (400 mg total) by mouth 2 (two) times daily with a meal. (Patient not taking: Reported on 11/14/2017) 60 tablet 3   No facility-administered medications prior to visit.      EXAM:  BP 112/62 (BP Location: Right Arm, Patient Position: Sitting, Cuff Size: Large)   Pulse 80   Temp 97.8 F (36.6 C) (Oral)   Wt 239 lb 14.4 oz (108.8 kg)   SpO2 92% Comment: pt SOB, O2 came up to 92% with pursed lip breathing  BMI 38.14 kg/m   Body mass index is 38.14 kg/m.  GENERAL: vitals reviewed and listed above, alert, oriented, appears well hydrated and in no acute distress LUNGS: clear to auscultation bilaterally, no wheezes, rales or rhonchi, CV: HRRR, no clubbing  or  peripheral edema nl cap refill  MS: moves all extremities without noticeable focal  abnormality PSYCH: pleasant and cooperative,nl speech no breathless  Lab Results  Component Value Date   WBC 7.1 09/07/2017   HGB 14.9 09/07/2017   HCT 44.4 09/07/2017   PLT 280 09/07/2017   GLUCOSE 102 (H) 09/07/2017   CHOL 116 10/16/2016   TRIG 139 10/16/2016   HDL 42 10/16/2016   LDLCALC 46 10/16/2016   ALT 27 01/23/2017   AST 19 01/23/2017   NA 141 09/07/2017   K 4.1 09/07/2017   CL 101 09/07/2017   CREATININE 1.19 (H) 09/07/2017   BUN 26 09/07/2017   CO2 25 09/07/2017   TSH 4.390 10/16/2016   INR 1.0 05/08/2017   HGBA1C 6.2 11/14/2017   MICROALBUR <0.7 01/23/2017   BP Readings from Last 3 Encounters:  11/14/17 112/62  09/12/17 129/60  09/07/17 120/82    ASSESSMENT AND PLAN:  Discussed the following assessment and plan:  Pre-diabetes - Plan: POC HgB A1c  Stress due to illness of family member  Medication management - Plan: POC HgB A1c  Essential hypertension  Pulmonary hypertension ([place])  Anticoagulant long-term use  Chronic diastolic heart failure-  grade 2 diastolic dysfunction by echo 10/03/13 Due for hepatic liipid panel  Make fasting lab appt  To get this and if stable then CPX PV in 4-6 months   Total visit 40mins > 50% spent counseling and coordinating care as indicated in above note and in instructions to patient .  Regarding  Stress of moms situation   She is now in [place] [facility] and difficulties   w ensuring care -Patient advised to return or notify health care team  if  new concerns arise. Total visit 58mins > 50% spent counseling and coordinating care as indicated in above note and in instructions to patient .   Patient Instructions  Hg a1c is stable not diabetic range .   Get fasting lab appt   For lipids and liver tests .   If all ok then   Preventive visit in   4-6 months .      [name]. Maeghan Canny M.D.

## 2017-11-14 ENCOUNTER — Ambulatory Visit (INDEPENDENT_AMBULATORY_CARE_PROVIDER_SITE_OTHER): Payer: PPO | Admitting: Internal Medicine

## 2017-11-14 ENCOUNTER — Encounter: Payer: Self-pay | Admitting: Internal Medicine

## 2017-11-14 VITALS — BP 112/62 | HR 80 | Temp 97.8°F | Wt 239.9 lb

## 2017-11-14 DIAGNOSIS — I5032 Chronic diastolic (congestive) heart failure: Secondary | ICD-10-CM

## 2017-11-14 DIAGNOSIS — R7303 Prediabetes: Secondary | ICD-10-CM

## 2017-11-14 DIAGNOSIS — Z7901 Long term (current) use of anticoagulants: Secondary | ICD-10-CM | POA: Diagnosis not present

## 2017-11-14 DIAGNOSIS — Z6379 Other stressful life events affecting family and household: Secondary | ICD-10-CM | POA: Diagnosis not present

## 2017-11-14 DIAGNOSIS — I272 Pulmonary hypertension, unspecified: Secondary | ICD-10-CM

## 2017-11-14 DIAGNOSIS — I10 Essential (primary) hypertension: Secondary | ICD-10-CM | POA: Diagnosis not present

## 2017-11-14 DIAGNOSIS — Z79899 Other long term (current) drug therapy: Secondary | ICD-10-CM | POA: Diagnosis not present

## 2017-11-14 LAB — POCT GLYCOSYLATED HEMOGLOBIN (HGB A1C): Hemoglobin A1C: 6.2

## 2017-11-14 NOTE — Patient Instructions (Addendum)
Hg a1c is stable not diabetic range .   Get fasting lab appt   For lipids and liver tests .   If all ok then   Preventive visit in   4-6 months .

## 2017-11-17 ENCOUNTER — Other Ambulatory Visit: Payer: Self-pay | Admitting: Internal Medicine

## 2017-11-19 ENCOUNTER — Other Ambulatory Visit: Payer: PPO

## 2017-12-10 ENCOUNTER — Ambulatory Visit: Payer: PPO | Admitting: Cardiology

## 2017-12-10 ENCOUNTER — Encounter: Payer: Self-pay | Admitting: Cardiology

## 2017-12-10 VITALS — BP 110/70 | HR 91 | Ht 66.5 in | Wt 239.6 lb

## 2017-12-10 DIAGNOSIS — Z01812 Encounter for preprocedural laboratory examination: Secondary | ICD-10-CM

## 2017-12-10 DIAGNOSIS — G4733 Obstructive sleep apnea (adult) (pediatric): Secondary | ICD-10-CM | POA: Diagnosis not present

## 2017-12-10 DIAGNOSIS — I495 Sick sinus syndrome: Secondary | ICD-10-CM

## 2017-12-10 DIAGNOSIS — I10 Essential (primary) hypertension: Secondary | ICD-10-CM | POA: Diagnosis not present

## 2017-12-10 DIAGNOSIS — I481 Persistent atrial fibrillation: Secondary | ICD-10-CM | POA: Diagnosis not present

## 2017-12-10 DIAGNOSIS — I5032 Chronic diastolic (congestive) heart failure: Secondary | ICD-10-CM | POA: Diagnosis not present

## 2017-12-10 DIAGNOSIS — I4819 Other persistent atrial fibrillation: Secondary | ICD-10-CM

## 2017-12-10 LAB — CUP PACEART INCLINIC DEVICE CHECK
Battery Impedance: 1700 Ohm
Battery Voltage: 2.78 V
Date Time Interrogation Session: 20190610092454
Implantable Lead Implant Date: 20081211
Implantable Lead Implant Date: 20081211
Implantable Lead Location: 753859
Implantable Lead Location: 753860
Implantable Pulse Generator Implant Date: 20081211
Lead Channel Impedance Value: 452 Ohm
Lead Channel Impedance Value: 470 Ohm
Lead Channel Pacing Threshold Amplitude: 1 V
Lead Channel Pacing Threshold Pulse Width: 1 ms
Lead Channel Sensing Intrinsic Amplitude: 0.5 mV
Lead Channel Setting Pacing Amplitude: 2 V
Lead Channel Setting Pacing Amplitude: 2.5 V
Lead Channel Setting Pacing Pulse Width: 1 ms
Lead Channel Setting Sensing Sensitivity: 2 mV
Pulse Gen Model: 5826
Pulse Gen Serial Number: 1999089

## 2017-12-10 LAB — CBC
Hematocrit: 43.2 % (ref 34.0–46.6)
Hemoglobin: 14.6 g/dL (ref 11.1–15.9)
MCH: 32.2 pg (ref 26.6–33.0)
MCHC: 33.8 g/dL (ref 31.5–35.7)
MCV: 95 fL (ref 79–97)
Platelets: 271 10*3/uL (ref 150–450)
RBC: 4.54 x10E6/uL (ref 3.77–5.28)
RDW: 13.9 % (ref 12.3–15.4)
WBC: 7.2 10*3/uL (ref 3.4–10.8)

## 2017-12-10 LAB — BASIC METABOLIC PANEL
BUN/Creatinine Ratio: 25 (ref 12–28)
BUN: 29 mg/dL — ABNORMAL HIGH (ref 8–27)
CO2: 25 mmol/L (ref 20–29)
Calcium: 9.7 mg/dL (ref 8.7–10.3)
Chloride: 101 mmol/L (ref 96–106)
Creatinine, Ser: 1.17 mg/dL — ABNORMAL HIGH (ref 0.57–1.00)
GFR calc Af Amer: 54 mL/min/{1.73_m2} — ABNORMAL LOW (ref >59)
GFR calc non Af Amer: 47 mL/min/{1.73_m2} — ABNORMAL LOW (ref >59)
Glucose: 113 mg/dL — ABNORMAL HIGH (ref 65–99)
Potassium: 4.5 mmol/L (ref 3.5–5.2)
Sodium: 141 mmol/L (ref 134–144)

## 2017-12-10 NOTE — H&P (View-Only) (Signed)
Electrophysiology Office Note   Date:  12/10/2017   ID:  [name], DOB 30-May-1946, MRN [id number]  PCP:  [name], MD  Primary Electrophysiologist: Gaye Alken, MD    No chief complaint on file.    History of Present Illness: [name] is a 72 y.o. female who presents today for electrophysiology evaluation.   She has a history of coronary artery disease status post stent to the RCA in 2004, CHF, atrial fibrillation with tachybradycardia syndrome status post pacemaker, CKD, hypertension, hyperlipidemia, inferior wall MI, sleep apnea on CPAP, stroke. She is maintaining sinus rhythm on CPAP and dofetilide until she got an infection and when in atrial fibrillation. Had ablation on 09/29/16.  Today, denies symptoms of palpitations, chest pain, shortness of breath, orthopnea, PND, lower extremity edema, claudication, dizziness, presyncope, syncope, bleeding, or neurologic sequela. The patient is tolerating medications without difficulties.  Overall she is feeling weak and fatigued.  She is in atrial fibrillation today.  She feels that she went in atrial fibrillation at the beginning of May.  At this time, her mother had quite a few health issues and she has been quite stressed with quite a bit of anxiety since that.  She feels that this was her trigger for atrial fibrillation.   Past Medical History:  Diagnosis Date  . Anticoagulant long-term use    pradaxa  . Anxiety   . Arthritis    "fingers, lower back" (04/23/2017   . CAD (coronary artery disease) 2355,7322   post PTCA with bare-metal stenting to mid RCA in December 2004     . CHF (congestive heart failure) ([place])   . Chronic atrial fibrillation ([place]) 06/2007   Tachybradycardia pacemaker  . Chronic kidney disease    10% function - ?R, other kidney is compensating    . CVA (cerebral vascular accident) [facility]) [phone]   denies residual on 04/23/2017  . Depression   . Diplopia 06/19/2008   Qualifier:  Diagnosis of  By: [name] MD, [name]   . Dysrhythmia    ATRIAL FIBRILATION  . Edema of lower extremity   . Hyperlipidemia   . Hypertension   . Inferior myocardial infarction [facility])    acute inferior wall mi/other medical hx  . Myocardial infarction ([place]) [id number]  . OSA on CPAP    last test- 2010  . Pacemaker   . Pneumonia 2014   tx. ----  [facility]  . Pulmonary hypertension ([place])    moderate pulmonary hypertension by 10/2016 echo and 10/2013 cardiac cath  . Shortness of breath   . Skin cancer    "cut off right [place]; burned off LLE" (04/23/2017)  . Spondylolisthesis   . TIA (transient ischemic attack) 2008  . Unspecified hemorrhoids without mention of complication 2/37/6283   Colonoscopy--Dr. Carlean Purl    Past Surgical History:  Procedure Laterality Date  . APPENDECTOMY  1984  . ATRIAL FIBRILLATION ABLATION N/A 09/29/2016   Procedure: Atrial Fibrillation Ablation;  Surgeon: Ebelyn Bohnet Meredith Leeds, MD;  Location: [place] CV LAB;  Service: Cardiovascular;  Laterality: N/A;  . BACK SURGERY    . CARDIOVERSION N/A 09/12/2017   Procedure: CARDIOVERSION;  Surgeon: Jerline Pain, MD;  Location: [facility] ENDOSCOPY;  Service: Cardiovascular;  Laterality: N/A;  . CATARACT EXTRACTION W/ INTRAOCULAR LENS  IMPLANT, BILATERAL Bilateral 01/15/2017- 03/2017  . CORONARY ANGIOPLASTY  X 2  . CORONARY ANGIOPLASTY WITH STENT PLACEMENT  1998; ~ 2007; ?date   "1 stent; replaced stent; not sure when  I got the last stent" (04/23/2017)  . DOPPLER ECHOCARDIOGRAPHY  2009  . ELECTROPHYSIOLOGIC STUDY N/A 03/31/2016   Procedure: Cardioversion;  Surgeon: Evans Lance, MD;  Location: [place] CV LAB;  Service: Cardiovascular;  Laterality: N/A;  . ELECTROPHYSIOLOGIC STUDY N/A 08/04/2016   Procedure: Cardioversion;  Surgeon: Evans Lance, MD;  Location: [place] CV LAB;  Service: Cardiovascular;  Laterality: N/A;  . INSERT / REPLACE / REMOVE PACEMAKER  06/2007  . JOINT REPLACEMENT    . LAPAROSCOPIC  CHOLECYSTECTOMY  1994  . LEFT AND RIGHT HEART CATHETERIZATION WITH CORONARY ANGIOGRAM N/A 10/06/2013   Procedure: LEFT AND RIGHT HEART CATHETERIZATION WITH CORONARY ANGIOGRAM;  Surgeon: Troy Sine, MD;  Location: [facility] CATH LAB;  Service: Cardiovascular;  Laterality: N/A;  . LEFT OOPHORECTOMY Left ~ 1989  . POSTERIOR LUMBAR FUSION  2000s - 04/2015 X 3   L3-4; L4-5; L2-3; Dr Trenton Gammon  . RIGHT/LEFT HEART CATH AND CORONARY ANGIOGRAPHY N/A 05/09/2017   Procedure: RIGHT/LEFT HEART CATH AND CORONARY ANGIOGRAPHY;  Surgeon: Sherren Mocha, MD;  Location: [place] CV LAB;  Service: Cardiovascular;  Laterality: N/A;  . SKIN CANCER EXCISION Right    [place]  . TEE WITHOUT CARDIOVERSION N/A 09/29/2016   Procedure: TRANSESOPHAGEAL ECHOCARDIOGRAM (TEE);  Surgeon: Jerline Pain, MD;  Location: [place];  Service: Cardiovascular;  Laterality: N/A;  . TOTAL ABDOMINAL HYSTERECTOMY  1984   "uterus & right ovary"  . TOTAL KNEE ARTHROPLASTY Left 04/23/2017  . TOTAL KNEE ARTHROPLASTY Left 04/23/2017   Procedure: TOTAL KNEE ARTHROPLASTY;  Surgeon: Frederik Pear, MD;  Location: [place];  Service: Orthopedics;  Laterality: Left;  . ULTRASOUND GUIDANCE FOR VASCULAR ACCESS  05/09/2017   Procedure: Ultrasound Guidance For Vascular Access;  Surgeon: Sherren Mocha, MD;  Location: [place] CV LAB;  Service: Cardiovascular;;     Current Outpatient Medications  Medication Sig Dispense Refill  . acetaminophen (TYLENOL) 500 MG tablet Take 500 mg by mouth 2 (two) times daily.     . Ascorbic Acid (VITAMIN C) 1000 MG tablet Take 1,000 mg by mouth daily.    [name] atorvastatin (LIPITOR) 40 MG tablet TAKE 1 TABLET BY MOUTH EVERY DAY 90 tablet 3  . Calcium Carbonate-Vitamin D (CALCIUM 600+D PO) Take 1 tablet by mouth daily.    . Cyanocobalamin (B-12) 5000 MCG CAPS Take 5,000 mcg by mouth at bedtime.     [name] diltiazem (CARDIZEM CD) 120 MG 24 hr capsule TAKE 1 CAPSULE (120 MG TOTAL) BY MOUTH DAILY. 30 capsule 11  . ELIQUIS 5 MG TABS  tablet Take 5 mg by mouth 2 (two) times daily.  11  . FLUoxetine (PROZAC) 20 MG capsule Take 1 capsule (20 mg total) by mouth every morning. 90 capsule 0  . fluticasone (FLONASE) 50 MCG/ACT nasal spray Place 1 spray into both nostrils at bedtime as needed for allergies or rhinitis.     [name] KLOR-CON M20 20 MEQ tablet TAKE 1 TABLET BY MOUTH TWICE A DAY (Patient taking differently: TAKE 20 MEQ BY MOUTH TWICE A DAY) 180 tablet 3  . loratadine (CLARITIN) 10 MG tablet TAKE 1 TABLET BY MOUTH EVERY DAY 30 tablet 5  . Lysine 1000 MG TABS Take 1,000 mg by mouth daily.    . metoprolol succinate (TOPROL-XL) 50 MG 24 hr tablet Take 1 tablet (50 mg total) by mouth 2 (two) times daily. Take with or immediately following a meal. 180 tablet 3  . Multiple Vitamin (MULTIVITAMIN WITH MINERALS) TABS tablet Take 1 tablet by mouth  daily. Centrum    . nitroGLYCERIN (NITROSTAT) 0.4 MG SL tablet Place 1 tablet (0.4 mg total) under the tongue every 5 (five) minutes x 3 doses as needed for chest pain. 25 tablet 6  . omeprazole (PRILOSEC) 20 MG capsule Take 20 mg by mouth daily.    Vladimir Faster Glycol-Propyl Glycol (SYSTANE) 0.4-0.3 % SOLN Place 1 drop into the right eye daily as needed (dry eyes).    . torsemide (DEMADEX) 20 MG tablet Take 40 mg by mouth 2 (two) times daily.    . valACYclovir (VALTREX) 1000 MG tablet TAKE 2 TABLETS BY MOUTH AND REPEAT IN 12 HOURS FOR COLD SORES 30 tablet 1  . vitamin E 1000 UNIT capsule Take 1,000 Units by mouth daily.     No current facility-administered medications for this visit.     Allergies:   Adhesive [tape]   Social History:  The patient  reports that she quit smoking about 35 years ago. Her smoking use included cigarettes. She started smoking about 39 years ago. She has a 5.00 pack-year smoking history. She has never used smokeless tobacco. She reports that she drinks alcohol. She reports that she does not use drugs.   Family History:  The patient's family history includes  Arrhythmia in her mother; Diabetes in her mother and paternal grandfather; Heart attack (age of onset: 24) in her brother; Heart disease in her paternal aunt; Hypertension in her mother; Prostate cancer in her maternal grandfather; Suicidality in her father.   ROS:  Please see the history of present illness.   Otherwise, review of systems is positive for leg swelling, shortness of breath, chest pressure, palpitations.   All other systems are reviewed and negative.   PHYSICAL EXAM: VS:  BP 110/70   Pulse 91   Ht 5' 6.5" (1.689 m)   Wt 239 lb 9.6 oz (108.7 kg)   BMI 38.09 kg/m  , BMI Body mass index is 38.09 kg/m. GEN: Well nourished, well developed, in no acute distress  HEENT: normal  Neck: no JVD, carotid bruits, or masses Cardiac: iRRR; no murmurs, rubs, or gallops,no edema  Respiratory:  clear to auscultation bilaterally, normal work of breathing GI: soft, nontender, nondistended, + BS MS: no deformity or atrophy  Skin: warm and dry, device site well healed Neuro:  Strength and sensation are intact Psych: euthymic mood, full affect  EKG:  EKG is ordered today. Personal review of the ekg ordered shows atrial fibrillation, rate 91  Personal review of the device interrogation today. Results in [place]: 01/23/2017: ALT 27 09/07/2017: BUN 26; Creatinine, Ser 1.19; Hemoglobin 14.9; Platelets 280; Potassium 4.1; Sodium 141    Lipid Panel     Component Value Date/Time   CHOL 116 10/16/2016 0850   TRIG 139 10/16/2016 0850   HDL 42 10/16/2016 0850   CHOLHDL 2.8 10/16/2016 0850   CHOLHDL 4.5 03/31/2016 0248   VLDL 29 03/31/2016 0248   LDLCALC 46 10/16/2016 0850     Wt Readings from Last 3 Encounters:  12/10/17 239 lb 9.6 oz (108.7 kg)  11/14/17 239 lb 14.4 oz (108.8 kg)  09/07/17 238 lb (108 kg)      Other studies Reviewed: Additional studies/ records that were reviewed today include: TTE 03/28/16  Review of the above records today demonstrates:  - Left  ventricle: The cavity size was normal. Wall thickness was   increased in a pattern of mild LVH. Systolic function was normal.   The estimated ejection fraction was  in the range of 55% to 60%.   Indeterminant diastolic function (atrial fibrillation). Wall   motion was normal; there were no regional wall motion   abnormalities. - Aortic valve: There was no stenosis. - Mitral valve: Mildly calcified annulus. There was no significant   regurgitation. - Left atrium: The atrium was mildly dilated. - Right ventricle: The cavity size was normal. Pacer wire or   catheter noted in right ventricle. Systolic function was normal. - Tricuspid valve: Peak RV-RA gradient (S): 22 mm Hg. - Pulmonary arteries: PA peak pressure: 30 mm Hg (S). - Systemic veins: IVC measured 2.3 cm with normal respirophasic   variation, suggesting RA pressure 8 mmHg.  LHC/RHC 05/09/17 1.  Single-vessel coronary artery disease with continued patency of the stented segment in the right coronary artery and chronic occlusion of a small acute marginal branch collateralized by the left coronary artery 2.  Patent left main, LAD, and left circumflex without significant stenosis 3.  Vigorous LV systolic function with mildly elevated LVEDP 4.  Severe pulmonary hypertension with mean pulmonary artery pressure of 48 mmHg.  Because of high cardiac output (8.5 L/min), the patient's pulmonary vascular resistance is only mildly increased at 3 [place] units.  ASSESSMENT AND PLAN:  1.  Persistent atrial fibrillation: Relation 09/29/2016.  Currently on Eliquis.  She is not tolerated Multaq due to extreme shortness of breath.  She has had quite a bit more stress and anxiety over the last month which she feels put her back into atrial fibrillation.  Due to that, we Kadijah Shamoon plan for cardioversion.    This patients CHA2DS2-VASc Score and unadjusted Ischemic Stroke Rate (% per year) is equal to 3.2 % stroke rate/year from a score of 3  Above score  calculated as 1 point each if present [CHF, HTN, DM, Vascular=MI/PAD/Aortic Plaque, Age if 65-74, or Female] Above score calculated as 2 points each if present [Age > 75, or Stroke/TIA/TE]   2. Hypertension: Currently well controlled.  No changes.  3. Obesity: Weight loss encouraged  4. Chronic diastolic heart failure: No volume overload at this time  5. Obstructive sleep apnea: CPAP compliance encouraged  6.  Sinus node dysfunction: Saint Jude dual-chamber pacemaker functioning appropriately.  No changes.  Current medicines are reviewed at length with the patient today.   The patient does not have concerns regarding her medicines.  The following changes were made today: None  Labs/ tests ordered today include:  Orders Placed This Encounter  Procedures  . Basic Metabolic Panel (BMET)  . CBC  . CUP PACEART [place]  . EKG 12-Lead     Disposition:   FU with Kelly Ranieri to months  Signed, Lennon Boutwell Meredith Leeds, MD  12/10/2017 9:34 AM     [facility] [address] [place] [address] [phone] (office) [phone] (fax)

## 2017-12-10 NOTE — Progress Notes (Signed)
Electrophysiology Office Note   Date:  12/10/2017   ID:  [name], DOB Dec 27, 1945, MRN [id number]  PCP:  [name], MD  Primary Electrophysiologist: Gaye Alken, MD    No chief complaint on file.    History of Present Illness: [name] is a 72 y.o. female who presents today for electrophysiology evaluation.   She has a history of coronary artery disease status post stent to the RCA in 2004, CHF, atrial fibrillation with tachybradycardia syndrome status post pacemaker, CKD, hypertension, hyperlipidemia, inferior wall MI, sleep apnea on CPAP, stroke. She is maintaining sinus rhythm on CPAP and dofetilide until she got an infection and when in atrial fibrillation. Had ablation on 09/29/16.  Today, denies symptoms of palpitations, chest pain, shortness of breath, orthopnea, PND, lower extremity edema, claudication, dizziness, presyncope, syncope, bleeding, or neurologic sequela. The patient is tolerating medications without difficulties.  Overall she is feeling weak and fatigued.  She is in atrial fibrillation today.  She feels that she went in atrial fibrillation at the beginning of May.  At this time, her mother had quite a few health issues and she has been quite stressed with quite a bit of anxiety since that.  She feels that this was her trigger for atrial fibrillation.   Past Medical History:  Diagnosis Date  . Anticoagulant long-term use    pradaxa  . Anxiety   . Arthritis    "fingers, lower back" (04/23/2017   . CAD (coronary artery disease) 9622,2979   post PTCA with bare-metal stenting to mid RCA in December 2004     . CHF (congestive heart failure) ([place])   . Chronic atrial fibrillation ([place]) 06/2007   Tachybradycardia pacemaker  . Chronic kidney disease    10% function - ?R, other kidney is compensating    . CVA (cerebral vascular accident) [facility]) 8921,1941   denies residual on 04/23/2017  . Depression   . Diplopia 06/19/2008   Qualifier:  Diagnosis of  By: [name] MD, [name]   . Dysrhythmia    ATRIAL FIBRILATION  . Edema of lower extremity   . Hyperlipidemia   . Hypertension   . Inferior myocardial infarction [facility])    acute inferior wall mi/other medical hx  . Myocardial infarction ([place]) [id number]  . OSA on CPAP    last test- 2010  . Pacemaker   . Pneumonia 2014   tx. ----  [facility]  . Pulmonary hypertension ([name])    moderate pulmonary hypertension by 10/2016 echo and 10/2013 cardiac cath  . Shortness of breath   . Skin cancer    "cut off right [place]; burned off LLE" (04/23/2017)  . Spondylolisthesis   . TIA (transient ischemic attack) 2008  . Unspecified hemorrhoids without mention of complication 7/40/8144   Colonoscopy--Dr. Carlean Purl    Past Surgical History:  Procedure Laterality Date  . APPENDECTOMY  1984  . ATRIAL FIBRILLATION ABLATION N/A 09/29/2016   Procedure: Atrial Fibrillation Ablation;  Surgeon: Matisyn Cabeza Meredith Leeds, MD;  Location: [place] CV LAB;  Service: Cardiovascular;  Laterality: N/A;  . BACK SURGERY    . CARDIOVERSION N/A 09/12/2017   Procedure: CARDIOVERSION;  Surgeon: Jerline Pain, MD;  Location: [facility] ENDOSCOPY;  Service: Cardiovascular;  Laterality: N/A;  . CATARACT EXTRACTION W/ INTRAOCULAR LENS  IMPLANT, BILATERAL Bilateral 01/15/2017- 03/2017  . CORONARY ANGIOPLASTY  X 2  . CORONARY ANGIOPLASTY WITH STENT PLACEMENT  1998; ~ 2007; ?date   "1 stent; replaced stent; not sure when  I got the last stent" (04/23/2017)  . DOPPLER ECHOCARDIOGRAPHY  2009  . ELECTROPHYSIOLOGIC STUDY N/A 03/31/2016   Procedure: Cardioversion;  Surgeon: Evans Lance, MD;  Location: [place] CV LAB;  Service: Cardiovascular;  Laterality: N/A;  . ELECTROPHYSIOLOGIC STUDY N/A 08/04/2016   Procedure: Cardioversion;  Surgeon: Evans Lance, MD;  Location: [place] CV LAB;  Service: Cardiovascular;  Laterality: N/A;  . INSERT / REPLACE / REMOVE PACEMAKER  06/2007  . JOINT REPLACEMENT    . LAPAROSCOPIC  CHOLECYSTECTOMY  1994  . LEFT AND RIGHT HEART CATHETERIZATION WITH CORONARY ANGIOGRAM N/A 10/06/2013   Procedure: LEFT AND RIGHT HEART CATHETERIZATION WITH CORONARY ANGIOGRAM;  Surgeon: Troy Sine, MD;  Location: [facility] CATH LAB;  Service: Cardiovascular;  Laterality: N/A;  . LEFT OOPHORECTOMY Left ~ 1989  . POSTERIOR LUMBAR FUSION  2000s - 04/2015 X 3   L3-4; L4-5; L2-3; Dr Trenton Gammon  . RIGHT/LEFT HEART CATH AND CORONARY ANGIOGRAPHY N/A 05/09/2017   Procedure: RIGHT/LEFT HEART CATH AND CORONARY ANGIOGRAPHY;  Surgeon: Sherren Mocha, MD;  Location: [place] CV LAB;  Service: Cardiovascular;  Laterality: N/A;  . SKIN CANCER EXCISION Right    [place]  . TEE WITHOUT CARDIOVERSION N/A 09/29/2016   Procedure: TRANSESOPHAGEAL ECHOCARDIOGRAM (TEE);  Surgeon: Jerline Pain, MD;  Location: [place];  Service: Cardiovascular;  Laterality: N/A;  . TOTAL ABDOMINAL HYSTERECTOMY  1984   "uterus & right ovary"  . TOTAL KNEE ARTHROPLASTY Left 04/23/2017  . TOTAL KNEE ARTHROPLASTY Left 04/23/2017   Procedure: TOTAL KNEE ARTHROPLASTY;  Surgeon: Frederik Pear, MD;  Location: [place];  Service: Orthopedics;  Laterality: Left;  . ULTRASOUND GUIDANCE FOR VASCULAR ACCESS  05/09/2017   Procedure: Ultrasound Guidance For Vascular Access;  Surgeon: Sherren Mocha, MD;  Location: [place] CV LAB;  Service: Cardiovascular;;     Current Outpatient Medications  Medication Sig Dispense Refill  . acetaminophen (TYLENOL) 500 MG tablet Take 500 mg by mouth 2 (two) times daily.     . Ascorbic Acid (VITAMIN C) 1000 MG tablet Take 1,000 mg by mouth daily.    [name] atorvastatin (LIPITOR) 40 MG tablet TAKE 1 TABLET BY MOUTH EVERY DAY 90 tablet 3  . Calcium Carbonate-Vitamin D (CALCIUM 600+D PO) Take 1 tablet by mouth daily.    . Cyanocobalamin (B-12) 5000 MCG CAPS Take 5,000 mcg by mouth at bedtime.     [name] diltiazem (CARDIZEM CD) 120 MG 24 hr capsule TAKE 1 CAPSULE (120 MG TOTAL) BY MOUTH DAILY. 30 capsule 11  . ELIQUIS 5 MG TABS  tablet Take 5 mg by mouth 2 (two) times daily.  11  . FLUoxetine (PROZAC) 20 MG capsule Take 1 capsule (20 mg total) by mouth every morning. 90 capsule 0  . fluticasone (FLONASE) 50 MCG/ACT nasal spray Place 1 spray into both nostrils at bedtime as needed for allergies or rhinitis.     [name] KLOR-CON M20 20 MEQ tablet TAKE 1 TABLET BY MOUTH TWICE A DAY (Patient taking differently: TAKE 20 MEQ BY MOUTH TWICE A DAY) 180 tablet 3  . loratadine (CLARITIN) 10 MG tablet TAKE 1 TABLET BY MOUTH EVERY DAY 30 tablet 5  . Lysine 1000 MG TABS Take 1,000 mg by mouth daily.    . metoprolol succinate (TOPROL-XL) 50 MG 24 hr tablet Take 1 tablet (50 mg total) by mouth 2 (two) times daily. Take with or immediately following a meal. 180 tablet 3  . Multiple Vitamin (MULTIVITAMIN WITH MINERALS) TABS tablet Take 1 tablet by mouth  daily. Centrum    . nitroGLYCERIN (NITROSTAT) 0.4 MG SL tablet Place 1 tablet (0.4 mg total) under the tongue every 5 (five) minutes x 3 doses as needed for chest pain. 25 tablet 6  . omeprazole (PRILOSEC) 20 MG capsule Take 20 mg by mouth daily.    Vladimir Faster Glycol-Propyl Glycol (SYSTANE) 0.4-0.3 % SOLN Place 1 drop into the right eye daily as needed (dry eyes).    . torsemide (DEMADEX) 20 MG tablet Take 40 mg by mouth 2 (two) times daily.    . valACYclovir (VALTREX) 1000 MG tablet TAKE 2 TABLETS BY MOUTH AND REPEAT IN 12 HOURS FOR COLD SORES 30 tablet 1  . vitamin E 1000 UNIT capsule Take 1,000 Units by mouth daily.     No current facility-administered medications for this visit.     Allergies:   Adhesive [tape]   Social History:  The patient  reports that she quit smoking about 35 years ago. Her smoking use included cigarettes. She started smoking about 39 years ago. She has a 5.00 pack-year smoking history. She has never used smokeless tobacco. She reports that she drinks alcohol. She reports that she does not use drugs.   Family History:  The patient's family history includes  Arrhythmia in her mother; Diabetes in her mother and paternal grandfather; Heart attack (age of onset: 36) in her brother; Heart disease in her paternal aunt; Hypertension in her mother; Prostate cancer in her maternal grandfather; Suicidality in her father.   ROS:  Please see the history of present illness.   Otherwise, review of systems is positive for leg swelling, shortness of breath, chest pressure, palpitations.   All other systems are reviewed and negative.   PHYSICAL EXAM: VS:  BP 110/70   Pulse 91   Ht 5' 6.5" (1.689 m)   Wt 239 lb 9.6 oz (108.7 kg)   BMI 38.09 kg/m  , BMI Body mass index is 38.09 kg/m. GEN: Well nourished, well developed, in no acute distress  HEENT: normal  Neck: no JVD, carotid bruits, or masses Cardiac: iRRR; no murmurs, rubs, or gallops,no edema  Respiratory:  clear to auscultation bilaterally, normal work of breathing GI: soft, nontender, nondistended, + BS MS: no deformity or atrophy  Skin: warm and dry, device site well healed Neuro:  Strength and sensation are intact Psych: euthymic mood, full affect  EKG:  EKG is ordered today. Personal review of the ekg ordered shows atrial fibrillation, rate 91  Personal review of the device interrogation today. Results in [place]: 01/23/2017: ALT 27 09/07/2017: BUN 26; Creatinine, Ser 1.19; Hemoglobin 14.9; Platelets 280; Potassium 4.1; Sodium 141    Lipid Panel     Component Value Date/Time   CHOL 116 10/16/2016 0850   TRIG 139 10/16/2016 0850   HDL 42 10/16/2016 0850   CHOLHDL 2.8 10/16/2016 0850   CHOLHDL 4.5 03/31/2016 0248   VLDL 29 03/31/2016 0248   LDLCALC 46 10/16/2016 0850     Wt Readings from Last 3 Encounters:  12/10/17 239 lb 9.6 oz (108.7 kg)  11/14/17 239 lb 14.4 oz (108.8 kg)  09/07/17 238 lb (108 kg)      Other studies Reviewed: Additional studies/ records that were reviewed today include: TTE 03/28/16  Review of the above records today demonstrates:  - Left  ventricle: The cavity size was normal. Wall thickness was   increased in a pattern of mild LVH. Systolic function was normal.   The estimated ejection fraction was  in the range of 55% to 60%.   Indeterminant diastolic function (atrial fibrillation). Wall   motion was normal; there were no regional wall motion   abnormalities. - Aortic valve: There was no stenosis. - Mitral valve: Mildly calcified annulus. There was no significant   regurgitation. - Left atrium: The atrium was mildly dilated. - Right ventricle: The cavity size was normal. Pacer wire or   catheter noted in right ventricle. Systolic function was normal. - Tricuspid valve: Peak RV-RA gradient (S): 22 mm Hg. - Pulmonary arteries: PA peak pressure: 30 mm Hg (S). - Systemic veins: IVC measured 2.3 cm with normal respirophasic   variation, suggesting RA pressure 8 mmHg.  LHC/RHC 05/09/17 1.  Single-vessel coronary artery disease with continued patency of the stented segment in the right coronary artery and chronic occlusion of a small acute marginal branch collateralized by the left coronary artery 2.  Patent left main, LAD, and left circumflex without significant stenosis 3.  Vigorous LV systolic function with mildly elevated LVEDP 4.  Severe pulmonary hypertension with mean pulmonary artery pressure of 48 mmHg.  Because of high cardiac output (8.5 L/min), the patient's pulmonary vascular resistance is only mildly increased at 3 Wood units.  ASSESSMENT AND PLAN:  1.  Persistent atrial fibrillation: Relation 09/29/2016.  Currently on Eliquis.  She is not tolerated Multaq due to extreme shortness of breath.  She has had quite a bit more stress and anxiety over the last month which she feels put her back into atrial fibrillation.  Due to that, we [place] plan for cardioversion.    This patients CHA2DS2-VASc Score and unadjusted Ischemic Stroke Rate (% per year) is equal to 3.2 % stroke rate/year from a score of 3  Above score  calculated as 1 point each if present [CHF, HTN, DM, Vascular=MI/PAD/Aortic Plaque, Age if 65-74, or Female] Above score calculated as 2 points each if present [Age > 75, or Stroke/TIA/TE]   2. Hypertension: Currently well controlled.  No changes.  3. Obesity: Weight loss encouraged  4. Chronic diastolic heart failure: No volume overload at this time  5. Obstructive sleep apnea: CPAP compliance encouraged  6.  Sinus node dysfunction: Saint Jude dual-chamber pacemaker functioning appropriately.  No changes.  Current medicines are reviewed at length with the patient today.   The patient does not have concerns regarding her medicines.  The following changes were made today: None  Labs/ tests ordered today include:  Orders Placed This Encounter  Procedures  . Basic Metabolic Panel (BMET)  . CBC  . CUP PACEART [place]  . EKG 12-Lead     Disposition:   FU with Pascha Fogal to months  Signed, Timohty Renbarger Meredith Leeds, MD  12/10/2017 9:34 AM     [facility] [address] [place] [address] [phone] (office) [phone] (fax)

## 2017-12-10 NOTE — Patient Instructions (Addendum)
Medication Instructions:  Your physician recommends that you continue on your current medications as directed. Please refer to the Current Medication list given to you today.  Labwork: Pre procedure labs today: BMET & CBC     *We will only notify you of abnormal results, otherwise continue current treatment plan.  Testing/Procedures: Your physician has recommended that you have a Cardioversion (DCCV). Electrical Cardioversion uses a jolt of electricity to your heart either through paddles or wired patches attached to your chest. This is a controlled, usually prescheduled, procedure. Defibrillation is done under light anesthesia in the hospital, and you usually go home the day of the procedure. This is done to get your heart back into a normal rhythm. You are not awake for the procedure.    CARDIOVERSION INSTRUCTIONS You are scheduled for a cardioversion on 12/13/2017 with Dr. Sallyanne Kuster or associates. Please go to [facility], enter through the [place], at 8:00 a.m.[name]  Do not have any food or drink after midnight on 12/12/2017.  You may take your medicines with a sip of water on the day of your procedure.  You will need someone to drive you home following your procedure.   Call the [place] office at [phone] if you have any questions, problems or concerns.    Follow-Up: Your physician recommends that you schedule a follow-up appointment in: July with Dr. Curt Bears.   * If you need a refill on your cardiac medications before your next appointment, please call your pharmacy.   *Please note that any paperwork needing to be filled out by the provider will need to be addressed at the front desk prior to seeing the provider. Please note that any FMLA, disability or other documents regarding health condition is subject to a $25.00 charge that must be received prior to completion of paperwork in the form of a money order or check.  Thank you for  choosing [facility]!!   Trinidad Curet, RN [phone]  Any Other Special Instructions Will Be Listed Below (If Applicable).   Electrical Cardioversion Electrical cardioversion is the delivery of a jolt of electricity to restore a normal rhythm to the heart. A rhythm that is too fast or is not regular keeps the heart from pumping well. In this procedure, sticky patches or metal paddles are placed on the chest to deliver electricity to the heart from a device. This procedure may be done in an emergency if:  There is low or no blood pressure as a result of the heart rhythm.  Normal rhythm must be restored as fast as possible to protect the brain and heart from further damage.  It may save a life.  This procedure may also be done for irregular or fast heart rhythms that are not immediately life-threatening. Tell a health care provider about:  Any allergies you have.  All medicines you are taking, including vitamins, herbs, eye drops, creams, and over-the-counter medicines.  Any problems you or family members have had with anesthetic medicines.  Any blood disorders you have.  Any surgeries you have had.  Any medical conditions you have.  Whether you are pregnant or may be pregnant. What are the risks? Generally, this is a safe procedure. However, problems may occur, including:  Allergic reactions to medicines.  A blood clot that breaks free and travels to other parts of your body.  The possible return of an abnormal heart rhythm within hours or days after the procedure.  Your heart stopping (  cardiac arrest). This is rare.  What happens before the procedure? Medicines  Your health care provider may have you start taking: ? Blood-thinning medicines (anticoagulants) so your blood does not clot as easily. ? Medicines may be given to help stabilize your heart rate and rhythm.  Ask your health care provider about changing or stopping your regular medicines. This is  especially important if you are taking diabetes medicines or blood thinners. General instructions  Plan to have someone take you home from the hospital or clinic.  If you will be going home right after the procedure, plan to have someone with you for 24 hours.  Follow instructions from your health care provider about eating or drinking restrictions. What happens during the procedure?  To lower your risk of infection: ? Your health care team will wash or sanitize their hands. ? Your skin will be washed with soap.  An IV tube will be inserted into one of your veins.  You will be given a medicine to help you relax (sedative).  Sticky patches (electrodes) or metal paddles may be placed on your chest.  An electrical shock will be delivered. The procedure may vary among health care providers and hospitals. What happens after the procedure?  Your blood pressure, heart rate, breathing rate, and blood oxygen level will be monitored until the medicines you were given have worn off.  Do not drive for 24 hours if you were given a sedative.  Your heart rhythm will be watched to make sure it does not change. This information is not intended to replace advice given to you by your health care provider. Make sure you discuss any questions you have with your health care provider. Document Released: 06/09/2002 Document Revised: 02/16/2016 Document Reviewed: 12/24/2015 Elsevier Interactive Patient Education  2017 [employer].

## 2017-12-12 ENCOUNTER — Other Ambulatory Visit: Payer: Self-pay | Admitting: Internal Medicine

## 2017-12-13 ENCOUNTER — Ambulatory Visit (HOSPITAL_COMMUNITY): Payer: PPO | Admitting: Certified Registered"

## 2017-12-13 ENCOUNTER — Ambulatory Visit (HOSPITAL_COMMUNITY)
Admission: RE | Admit: 2017-12-13 | Discharge: 2017-12-13 | Disposition: A | Discharge status: Home / Self Care | Payer: PPO | Source: Ambulatory Visit | Attending: Cardiovascular Disease | Admitting: Cardiovascular Disease

## 2017-12-13 ENCOUNTER — Other Ambulatory Visit: Payer: Self-pay

## 2017-12-13 ENCOUNTER — Encounter (HOSPITAL_COMMUNITY): Payer: Self-pay | Admitting: *Deleted

## 2017-12-13 ENCOUNTER — Encounter (HOSPITAL_COMMUNITY)
Admission: RE | Disposition: A | Discharge status: Home / Self Care | Payer: Self-pay | Source: Ambulatory Visit | Attending: Cardiovascular Disease

## 2017-12-13 DIAGNOSIS — M1990 Unspecified osteoarthritis, unspecified site: Secondary | ICD-10-CM | POA: Diagnosis not present

## 2017-12-13 DIAGNOSIS — E669 Obesity, unspecified: Secondary | ICD-10-CM | POA: Diagnosis not present

## 2017-12-13 DIAGNOSIS — G4733 Obstructive sleep apnea (adult) (pediatric): Secondary | ICD-10-CM | POA: Insufficient documentation

## 2017-12-13 DIAGNOSIS — Z79899 Other long term (current) drug therapy: Secondary | ICD-10-CM | POA: Diagnosis not present

## 2017-12-13 DIAGNOSIS — I481 Persistent atrial fibrillation: Secondary | ICD-10-CM | POA: Diagnosis not present

## 2017-12-13 DIAGNOSIS — Z9842 Cataract extraction status, left eye: Secondary | ICD-10-CM | POA: Insufficient documentation

## 2017-12-13 DIAGNOSIS — I480 Paroxysmal atrial fibrillation: Secondary | ICD-10-CM | POA: Diagnosis not present

## 2017-12-13 DIAGNOSIS — F329 Major depressive disorder, single episode, unspecified: Secondary | ICD-10-CM | POA: Insufficient documentation

## 2017-12-13 DIAGNOSIS — I5032 Chronic diastolic (congestive) heart failure: Secondary | ICD-10-CM | POA: Insufficient documentation

## 2017-12-13 DIAGNOSIS — Z90710 Acquired absence of both cervix and uterus: Secondary | ICD-10-CM | POA: Diagnosis not present

## 2017-12-13 DIAGNOSIS — Z888 Allergy status to other drugs, medicaments and biological substances status: Secondary | ICD-10-CM | POA: Diagnosis not present

## 2017-12-13 DIAGNOSIS — I130 Hypertensive heart and chronic kidney disease with heart failure and stage 1 through stage 4 chronic kidney disease, or unspecified chronic kidney disease: Secondary | ICD-10-CM | POA: Insufficient documentation

## 2017-12-13 DIAGNOSIS — Z955 Presence of coronary angioplasty implant and graft: Secondary | ICD-10-CM | POA: Insufficient documentation

## 2017-12-13 DIAGNOSIS — N189 Chronic kidney disease, unspecified: Secondary | ICD-10-CM | POA: Diagnosis not present

## 2017-12-13 DIAGNOSIS — I110 Hypertensive heart disease with heart failure: Secondary | ICD-10-CM | POA: Diagnosis not present

## 2017-12-13 DIAGNOSIS — Z85828 Personal history of other malignant neoplasm of skin: Secondary | ICD-10-CM | POA: Insufficient documentation

## 2017-12-13 DIAGNOSIS — Z6838 Body mass index (BMI) 38.0-38.9, adult: Secondary | ICD-10-CM | POA: Insufficient documentation

## 2017-12-13 DIAGNOSIS — Z90722 Acquired absence of ovaries, bilateral: Secondary | ICD-10-CM | POA: Diagnosis not present

## 2017-12-13 DIAGNOSIS — F419 Anxiety disorder, unspecified: Secondary | ICD-10-CM | POA: Diagnosis not present

## 2017-12-13 DIAGNOSIS — Z87891 Personal history of nicotine dependence: Secondary | ICD-10-CM | POA: Insufficient documentation

## 2017-12-13 DIAGNOSIS — Z9049 Acquired absence of other specified parts of digestive tract: Secondary | ICD-10-CM | POA: Insufficient documentation

## 2017-12-13 DIAGNOSIS — I252 Old myocardial infarction: Secondary | ICD-10-CM | POA: Insufficient documentation

## 2017-12-13 DIAGNOSIS — Z981 Arthrodesis status: Secondary | ICD-10-CM | POA: Diagnosis not present

## 2017-12-13 DIAGNOSIS — Z8249 Family history of ischemic heart disease and other diseases of the circulatory system: Secondary | ICD-10-CM | POA: Insufficient documentation

## 2017-12-13 DIAGNOSIS — I272 Pulmonary hypertension, unspecified: Secondary | ICD-10-CM | POA: Diagnosis not present

## 2017-12-13 DIAGNOSIS — Z8673 Personal history of transient ischemic attack (TIA), and cerebral infarction without residual deficits: Secondary | ICD-10-CM | POA: Diagnosis not present

## 2017-12-13 DIAGNOSIS — Z96652 Presence of left artificial knee joint: Secondary | ICD-10-CM | POA: Diagnosis not present

## 2017-12-13 DIAGNOSIS — I5033 Acute on chronic diastolic (congestive) heart failure: Secondary | ICD-10-CM | POA: Diagnosis not present

## 2017-12-13 DIAGNOSIS — E785 Hyperlipidemia, unspecified: Secondary | ICD-10-CM | POA: Diagnosis not present

## 2017-12-13 DIAGNOSIS — Z9841 Cataract extraction status, right eye: Secondary | ICD-10-CM | POA: Insufficient documentation

## 2017-12-13 DIAGNOSIS — I4891 Unspecified atrial fibrillation: Secondary | ICD-10-CM | POA: Diagnosis present

## 2017-12-13 DIAGNOSIS — I4819 Other persistent atrial fibrillation: Secondary | ICD-10-CM

## 2017-12-13 DIAGNOSIS — Z9889 Other specified postprocedural states: Secondary | ICD-10-CM | POA: Insufficient documentation

## 2017-12-13 HISTORY — PX: CARDIOVERSION: SHX1299

## 2017-12-13 SURGERY — CARDIOVERSION
Anesthesia: General

## 2017-12-13 MED ORDER — LIDOCAINE 2% (20 MG/ML) 5 ML SYRINGE
INTRAMUSCULAR | Status: DC | PRN
  Administered 2017-12-13: 40 mg via INTRAVENOUS

## 2017-12-13 MED ORDER — PROPOFOL 10 MG/ML IV BOLUS
INTRAVENOUS | Status: DC | PRN
  Administered 2017-12-13: 60 mg via INTRAVENOUS

## 2017-12-13 MED ORDER — SODIUM CHLORIDE 0.9 % IV SOLN
INTRAVENOUS | Status: DC
  Administered 2017-12-13: 500 mL via INTRAVENOUS

## 2017-12-13 NOTE — Anesthesia Postprocedure Evaluation (Signed)
Anesthesia Post Note  Patient: [name]  Procedure(s) Performed: CARDIOVERSION (N/A )     Patient location during evaluation: PACU Anesthesia Type: General Level of consciousness: awake and alert Pain management: pain level controlled Vital Signs Assessment: post-procedure vital signs reviewed and stable Respiratory status: spontaneous breathing, nonlabored ventilation, respiratory function stable and patient connected to nasal cannula oxygen Cardiovascular status: blood pressure returned to baseline and stable Postop Assessment: no apparent nausea or vomiting Anesthetic complications: no    Last Vitals:  Vitals:   12/13/17 0920 12/13/17 0924  BP:  (!) 156/62  Pulse: 62 65  Resp: 20 17  Temp:    SpO2: 91% 93%    Last Pain:  Vitals:   12/13/17 0924  TempSrc:   PainSc: 0-No pain                 Modelle Vollmer COKER

## 2017-12-13 NOTE — Discharge Instructions (Signed)
Electrical Cardioversion, Care After °This sheet gives you information about how to care for yourself after your procedure. Your health care provider may also give you more specific instructions. If you have problems or questions, contact your health care provider. °What can I expect after the procedure? °After the procedure, it is common to have: °· Some redness on the skin where the shocks were given. ° °Follow these instructions at home: °· Do not drive for 24 hours if you were given a medicine to help you relax (sedative). °· Take over-the-counter and prescription medicines only as told by your health care provider. °· Ask your health care provider how to check your pulse. Check it often. °· Rest for 48 hours after the procedure or as told by your health care provider. °· Avoid or limit your caffeine use as told by your health care provider. °Contact a health care provider if: °· You feel like your heart is beating too quickly or your pulse is not regular. °· You have a serious muscle cramp that does not go away. °Get help right away if: °· You have discomfort in your chest. °· You are dizzy or you feel faint. °· You have trouble breathing or you are short of breath. °· Your speech is slurred. °· You have trouble moving an arm or leg on one side of your body. °· Your fingers or toes turn cold or blue. °This information is not intended to replace advice given to you by your health care provider. Make sure you discuss any questions you have with your health care provider. °Document Released: 04/09/2013 Document Revised: 01/21/2016 Document Reviewed: 12/24/2015 °Elsevier Interactive Patient Education © 2018 Elsevier Inc. ° °

## 2017-12-13 NOTE — Op Note (Signed)
Procedure: Electrical Cardioversion Indications:  Atrial Fibrillation  Procedure Details:  Consent: Risks of procedure as well as the alternatives and risks of each were explained to the (patient/caregiver).  Consent for procedure obtained.  Time Out: Verified patient identification, verified procedure, site/side was marked, verified correct patient position, special equipment/implants available, medications/allergies/relevent history reviewed, required imaging and test results available.  Performed  Patient placed on cardiac monitor, pulse oximetry, supplemental oxygen as necessary.  Sedation given: propofol 60 mg IV, Dr. Linna Caprice Pacer pads placed anterior and posterior chest.  Cardioverted 1 time(s).  Cardioversion with synchronized biphasic 150J shock.  Evaluation: Findings: Post procedure EKG shows: NSR Complications: None Patient did tolerate procedure well. Post procedure device check showed normal function.  Time Spent Directly with the Patient:  30 minutes   [name] 12/13/2017, 9:05 AM

## 2017-12-13 NOTE — Interval H&P Note (Signed)
History and Physical Interval Note:  12/13/2017 8:38 AM  [name]  has presented today for surgery, with the diagnosis of AFIB  The various methods of treatment have been discussed with the patient and family. After consideration of risks, benefits and other options for treatment, the patient has consented to  Procedure(s): CARDIOVERSION (N/A) as a surgical intervention .  The patient's history has been reviewed, patient examined, no change in status, stable for surgery.  I have reviewed the patient's chart and labs.  Questions were answered to the patient's satisfaction.     Hussam Muniz

## 2017-12-13 NOTE — Transfer of Care (Signed)
Immediate Anesthesia Transfer of Care Note  Patient: [name]  Procedure(s) Performed: CARDIOVERSION (N/A )  Patient Location: Endoscopy Unit  Anesthesia Type:General  Level of Consciousness: drowsy and patient cooperative  Airway & Oxygen Therapy: Patient Spontanous Breathing and Patient connected to nasal cannula oxygen  Post-op Assessment: Report given to RN, Post -op Vital signs reviewed and stable and Patient moving all extremities  Post vital signs: Reviewed and stable  Last Vitals:  Vitals Value Taken Time  BP    Temp    Pulse 63 12/13/2017  9:08 AM  Resp 19 12/13/2017  9:08 AM  SpO2 94 % 12/13/2017  9:08 AM  Vitals shown include unvalidated device data.  Last Pain:  Vitals:   12/13/17 0806  TempSrc: Oral  PainSc: 0-No pain         Complications: No apparent anesthesia complications

## 2017-12-13 NOTE — Anesthesia Preprocedure Evaluation (Addendum)
Anesthesia Evaluation  Patient identified by MRN, date of birth, ID band Patient awake    Reviewed: Allergy & Precautions, NPO status , Patient's Chart, lab work & pertinent test results, reviewed documented beta blocker date and time   Airway Mallampati: I  TM Distance: >3 FB Neck ROM: Full    Dental  (+) Teeth Intact, Dental Advisory Given   Pulmonary sleep apnea and Continuous Positive Airway Pressure Ventilation , former smoker,    breath sounds clear to auscultation       Cardiovascular hypertension, Pt. on medications and Pt. on home beta blockers  Rhythm:Irregular Rate:Normal     Neuro/Psych    GI/Hepatic   Endo/Other    Renal/GU      Musculoskeletal   Abdominal (+) + obese,   Peds  Hematology   Anesthesia Other Findings   Reproductive/Obstetrics                            Anesthesia Physical Anesthesia Plan  ASA: III  Anesthesia Plan: General   Post-op Pain Management:    Induction:   PONV Risk Score and Plan:   Airway Management Planned: Mask  Additional Equipment: None  Intra-op Plan:   Post-operative Plan: Extubation in OR  Informed Consent: I have reviewed the patients History and Physical, chart, labs and discussed the procedure including the risks, benefits and alternatives for the proposed anesthesia with the patient or authorized representative who has indicated his/her understanding and acceptance.   Dental advisory given  Plan Discussed with: Anesthesiologist  Anesthesia Plan Comments:        Anesthesia Quick Evaluation

## 2017-12-14 ENCOUNTER — Other Ambulatory Visit: Payer: Self-pay | Admitting: Cardiology

## 2017-12-15 ENCOUNTER — Encounter (HOSPITAL_COMMUNITY): Payer: Self-pay | Admitting: Cardiovascular Disease

## 2018-01-01 ENCOUNTER — Telehealth: Payer: Self-pay | Admitting: Cardiology

## 2018-01-01 ENCOUNTER — Ambulatory Visit: Payer: PPO | Admitting: Cardiology

## 2018-01-01 ENCOUNTER — Encounter: Payer: Self-pay | Admitting: Cardiology

## 2018-01-01 VITALS — BP 122/66 | HR 89 | Ht 66.5 in | Wt 243.6 lb

## 2018-01-01 DIAGNOSIS — G4733 Obstructive sleep apnea (adult) (pediatric): Secondary | ICD-10-CM | POA: Diagnosis not present

## 2018-01-01 DIAGNOSIS — Z01812 Encounter for preprocedural laboratory examination: Secondary | ICD-10-CM | POA: Diagnosis not present

## 2018-01-01 DIAGNOSIS — I4819 Other persistent atrial fibrillation: Secondary | ICD-10-CM

## 2018-01-01 DIAGNOSIS — I10 Essential (primary) hypertension: Secondary | ICD-10-CM | POA: Diagnosis not present

## 2018-01-01 DIAGNOSIS — I495 Sick sinus syndrome: Secondary | ICD-10-CM | POA: Diagnosis not present

## 2018-01-01 DIAGNOSIS — I481 Persistent atrial fibrillation: Secondary | ICD-10-CM | POA: Diagnosis not present

## 2018-01-01 NOTE — Progress Notes (Signed)
Electrophysiology Office Note   Date:  01/01/2018   ID:  [name], DOB 1946-06-29, MRN [id number]  PCP:  [name], MD  Primary Electrophysiologist: Gaye Alken, MD    Chief Complaint  Patient presents with  . Pacemaker Check    Persistent Afib/Sinus node dysfunction     History of Present Illness: [name] is a 72 y.o. female who presents today for electrophysiology evaluation.   She has a history of coronary artery disease status post stent to the RCA in 2004, CHF, atrial fibrillation with tachybradycardia syndrome status post pacemaker, CKD, hypertension, hyperlipidemia, inferior wall MI, sleep apnea on CPAP, stroke. She is maintaining sinus rhythm on CPAP and dofetilide until she got an infection and when in atrial fibrillation. Had ablation on 09/29/16.  Unfortunately, over the last few months, she has required 2 cardioversions.  Today, denies symptoms of palpitations, chest pain, shortness of breath, orthopnea, PND, lower extremity edema, claudication, dizziness, presyncope, syncope, bleeding, or neurologic sequela. The patient is tolerating medications without difficulties.  She has had shortness of breath and palpitations.  she had a cardioversion, but quickly went back into atrial fibrillation.  She would prefer to avoid it drug therapy for her atrial fibrillation.    Past Medical History:  Diagnosis Date  . Anticoagulant long-term use    pradaxa  . Anxiety   . Arthritis    "fingers, lower back" (04/23/2017   . CAD (coronary artery disease) 9390,3009   post PTCA with bare-metal stenting to mid RCA in December 2004     . CHF (congestive heart failure) ([place])   . Chronic atrial fibrillation ([place]) 06/2007   Tachybradycardia pacemaker  . Chronic kidney disease    10% function - ?R, other kidney is compensating    . CVA (cerebral vascular accident) [facility]) [phone]   denies residual on 04/23/2017  . Depression   . Diplopia 06/19/2008   Qualifier:  Diagnosis of  By: [name] MD, [name]   . Dysrhythmia    ATRIAL FIBRILATION  . Edema of lower extremity   . Hyperlipidemia   . Hypertension   . Inferior myocardial infarction [facility])    acute inferior wall mi/other medical hx  . Myocardial infarction ([place]) [id number]  . OSA on CPAP    last test- 2010  . Pacemaker   . Pneumonia 2014   tx. ----  [facility]  . Pulmonary hypertension ([place])    moderate pulmonary hypertension by 10/2016 echo and 10/2013 cardiac cath  . Shortness of breath   . Skin cancer    "cut off right Cygan; burned off LLE" (04/23/2017)  . Spondylolisthesis   . TIA (transient ischemic attack) 2008  . Unspecified hemorrhoids without mention of complication 2/63/3354   Colonoscopy--Dr. Carlean Purl    Past Surgical History:  Procedure Laterality Date  . APPENDECTOMY  1984  . ATRIAL FIBRILLATION ABLATION N/A 09/29/2016   Procedure: Atrial Fibrillation Ablation;  Surgeon: Francelia Mclaren Meredith Leeds, MD;  Location: [place] CV LAB;  Service: Cardiovascular;  Laterality: N/A;  . BACK SURGERY    . CARDIOVERSION N/A 09/12/2017   Procedure: CARDIOVERSION;  Surgeon: Jerline Pain, MD;  Location: [facility] ENDOSCOPY;  Service: Cardiovascular;  Laterality: N/A;  . CARDIOVERSION N/A 12/13/2017   Procedure: CARDIOVERSION;  Surgeon: Sanda Klein, MD;  Location: [place];  Service: Cardiovascular;  Laterality: N/A;  . CATARACT EXTRACTION W/ INTRAOCULAR LENS  IMPLANT, BILATERAL Bilateral 01/15/2017- 03/2017  . CORONARY ANGIOPLASTY  X 2  . CORONARY  [place]; ~ 2007; ?date   "1 stent; replaced stent; not sure when I got the last stent" (04/23/2017)  . DOPPLER ECHOCARDIOGRAPHY  2009  . ELECTROPHYSIOLOGIC STUDY N/A 03/31/2016   Procedure: Cardioversion;  Surgeon: Evans Lance, MD;  Location: [place] CV LAB;  Service: Cardiovascular;  Laterality: N/A;  . ELECTROPHYSIOLOGIC STUDY N/A 08/04/2016   Procedure: Cardioversion;  Surgeon: Evans Lance, MD;  Location:  [place] CV LAB;  Service: Cardiovascular;  Laterality: N/A;  . INSERT / REPLACE / REMOVE PACEMAKER  06/2007  . JOINT REPLACEMENT    . LAPAROSCOPIC CHOLECYSTECTOMY  1994  . LEFT AND RIGHT HEART CATHETERIZATION WITH CORONARY ANGIOGRAM N/A 10/06/2013   Procedure: LEFT AND RIGHT HEART CATHETERIZATION WITH CORONARY ANGIOGRAM;  Surgeon: Troy Sine, MD;  Location: [facility] CATH LAB;  Service: Cardiovascular;  Laterality: N/A;  . LEFT OOPHORECTOMY Left ~ 1989  . POSTERIOR LUMBAR FUSION  2000s - 04/2015 X 3   L3-4; L4-5; L2-3; Dr Trenton Gammon  . RIGHT/LEFT HEART CATH AND CORONARY ANGIOGRAPHY N/A 05/09/2017   Procedure: RIGHT/LEFT HEART CATH AND CORONARY ANGIOGRAPHY;  Surgeon: Sherren Mocha, MD;  Location: [place] CV LAB;  Service: Cardiovascular;  Laterality: N/A;  . SKIN CANCER EXCISION Right    [place]  . TEE WITHOUT CARDIOVERSION N/A 09/29/2016   Procedure: TRANSESOPHAGEAL ECHOCARDIOGRAM (TEE);  Surgeon: Jerline Pain, MD;  Location: [place];  Service: Cardiovascular;  Laterality: N/A;  . TOTAL ABDOMINAL HYSTERECTOMY  1984   "uterus & right ovary"  . TOTAL KNEE ARTHROPLASTY Left 04/23/2017  . TOTAL KNEE ARTHROPLASTY Left 04/23/2017   Procedure: TOTAL KNEE ARTHROPLASTY;  Surgeon: Frederik Pear, MD;  Location: [place];  Service: Orthopedics;  Laterality: Left;  . ULTRASOUND GUIDANCE FOR VASCULAR ACCESS  05/09/2017   Procedure: Ultrasound Guidance For Vascular Access;  Surgeon: Sherren Mocha, MD;  Location: [place] CV LAB;  Service: Cardiovascular;;     Current Outpatient Medications  Medication Sig Dispense Refill  . acetaminophen (TYLENOL) 500 MG tablet Take 500 mg by mouth 2 (two) times daily.     . Ascorbic Acid (VITAMIN C) 1000 MG tablet Take 1,000 mg by mouth daily.    [name] atorvastatin (LIPITOR) 40 MG tablet TAKE 1 TABLET BY MOUTH EVERY DAY 90 tablet 3  . Calcium Carbonate-Vitamin D (CALCIUM 600+D PO) Take 1 tablet by mouth daily.    . Cyanocobalamin (B-12) 5000 MCG CAPS Take 5,000 mcg by  mouth at bedtime.     [name] diltiazem (CARDIZEM CD) 120 MG 24 hr capsule TAKE 1 CAPSULE (120 MG TOTAL) BY MOUTH DAILY. 30 capsule 11  . ELIQUIS 5 MG TABS tablet TAKE 1 TABLET BY MOUTH TWICE A DAY 60 tablet 5  . FLUoxetine (PROZAC) 20 MG capsule Take 1 capsule (20 mg total) by mouth every morning. 90 capsule 0  . fluticasone (FLONASE) 50 MCG/ACT nasal spray Place 1 spray into both nostrils at bedtime as needed for allergies or rhinitis.     [name] KLOR-CON M20 20 MEQ tablet TAKE 1 TABLET BY MOUTH TWICE A DAY (Patient taking differently: TAKE 20 MEQ BY MOUTH TWICE A DAY) 180 tablet 3  . loratadine (CLARITIN) 10 MG tablet TAKE 1 TABLET BY MOUTH EVERY DAY 30 tablet 5  . Lysine 1000 MG TABS Take 1,000 mg by mouth daily.    . metoprolol succinate (TOPROL-XL) 50 MG 24 hr tablet Take 1 tablet (50 mg total) by mouth 2 (two) times daily. Take with or immediately following a  meal. 180 tablet 3  . Multiple Vitamin (MULTIVITAMIN WITH MINERALS) TABS tablet Take 1 tablet by mouth daily. Centrum    . nitroGLYCERIN (NITROSTAT) 0.4 MG SL tablet Place 1 tablet (0.4 mg total) under the tongue every 5 (five) minutes x 3 doses as needed for chest pain. 25 tablet 6  . omeprazole (PRILOSEC) 20 MG capsule Take 20 mg by mouth daily.    Vladimir Faster Glycol-Propyl Glycol (SYSTANE) 0.4-0.3 % SOLN Place 1 drop into the right eye daily as needed (dry eyes).    . torsemide (DEMADEX) 20 MG tablet Take 40 mg by mouth 2 (two) times daily.    . valACYclovir (VALTREX) 1000 MG tablet TAKE 2 TABLETS BY MOUTH AND REPEAT IN 12 HOURS FOR COLD SORES 30 tablet 1  . vitamin E 1000 UNIT capsule Take 1,000 Units by mouth daily.     No current facility-administered medications for this visit.     Allergies:   Adhesive [tape]   Social History:  The patient  reports that she quit smoking about 35 years ago. Her smoking use included cigarettes. She started smoking about 39 years ago. She has a 5.00 pack-year smoking history. She has never used smokeless  tobacco. She reports that she drinks alcohol. She reports that she does not use drugs.   Family History:  The patient's family history includes Arrhythmia in her mother; Diabetes in her mother and paternal grandfather; Heart attack (age of onset: 88) in her brother; Heart disease in her paternal aunt; Hypertension in her mother; Prostate cancer in her maternal grandfather; Suicidality in her father.   ROS:  Please see the history of present illness.   Otherwise, review of systems is positive for rotations, shortness of breath, leg swelling.   All other systems are reviewed and negative.   PHYSICAL EXAM: VS:  BP 122/66   Pulse 89   Ht 5' 6.5" (1.689 m)   Wt 243 lb 9.6 oz (110.5 kg)   BMI 38.73 kg/m  , BMI Body mass index is 38.73 kg/m. GEN: Well nourished, well developed, in no acute distress  HEENT: normal  Neck: no JVD, carotid bruits, or masses Cardiac: iRRR; no murmurs, rubs, or gallops,no edema  Respiratory:  clear to auscultation bilaterally, normal work of breathing GI: soft, nontender, nondistended, + BS MS: no deformity or atrophy  Skin: warm and dry, device site well healed Neuro:  Strength and sensation are intact Psych: euthymic mood, full affect  EKG:  EKG is ordered today. Personal review of the ekg ordered shows atrial fibrillation, rate 89  Personal review of the device interrogation today. Results in [place]: 01/23/2017: ALT 27 12/10/2017: BUN 29; Creatinine, Ser 1.17; Hemoglobin 14.6; Platelets 271; Potassium 4.5; Sodium 141    Lipid Panel     Component Value Date/Time   CHOL 116 10/16/2016 0850   TRIG 139 10/16/2016 0850   HDL 42 10/16/2016 0850   CHOLHDL 2.8 10/16/2016 0850   CHOLHDL 4.5 03/31/2016 0248   VLDL 29 03/31/2016 0248   LDLCALC 46 10/16/2016 0850     Wt Readings from Last 3 Encounters:  01/01/18 243 lb 9.6 oz (110.5 kg)  12/10/17 239 lb 9.6 oz (108.7 kg)  11/14/17 239 lb 14.4 oz (108.8 kg)      Other studies  Reviewed: Additional studies/ records that were reviewed today include: TTE 03/28/16  Review of the above records today demonstrates:  - Left ventricle: The cavity size was normal. Wall thickness was  increased in a pattern of mild LVH. Systolic function was normal.   The estimated ejection fraction was in the range of 55% to 60%.   Indeterminant diastolic function (atrial fibrillation). Wall   motion was normal; there were no regional wall motion   abnormalities. - Aortic valve: There was no stenosis. - Mitral valve: Mildly calcified annulus. There was no significant   regurgitation. - Left atrium: The atrium was mildly dilated. - Right ventricle: The cavity size was normal. Pacer wire or   catheter noted in right ventricle. Systolic function was normal. - Tricuspid valve: Peak RV-RA gradient (S): 22 mm Hg. - Pulmonary arteries: PA peak pressure: 30 mm Hg (S). - Systemic veins: IVC measured 2.3 cm with normal respirophasic   variation, suggesting RA pressure 8 mmHg.  LHC/RHC 05/09/17 1.  Single-vessel coronary artery disease with continued patency of the stented segment in the right coronary artery and chronic occlusion of a small acute marginal branch collateralized by the left coronary artery 2.  Patent left main, LAD, and left circumflex without significant stenosis 3.  Vigorous LV systolic function with mildly elevated LVEDP 4.  Severe pulmonary hypertension with mean pulmonary artery pressure of 48 mmHg.  Because of high cardiac output (8.5 L/min), the patient's pulmonary vascular resistance is only mildly increased at 3 Wood units.  ASSESSMENT AND PLAN:  1.  Persistent atrial fibrillation: 09/29/2016.  Currently on Eliquis.  Did go back in atrial fibrillation and had cardioversion on 12/13/2017.  She is unfortunately back in atrial fibrillation.  At this point, she would prefer to avoid medications and would prefer a repeat ablation.  Risks and benefits were discussed and include  bleeding, tamponade, heart block, stroke, damage to surrounding organs.  She understands risks and is agreed to the procedure.  This patients CHA2DS2-VASc Score and unadjusted Ischemic Stroke Rate (% per year) is equal to 3.2 % stroke rate/year from a score of 3  Above score calculated as 1 point each if present [CHF, HTN, DM, Vascular=MI/PAD/Aortic Plaque, Age if 65-74, or Female] Above score calculated as 2 points each if present [Age > 75, or Stroke/TIA/TE]   2. Hypertension: Currently well controlled  3. Obesity: Weight loss encouraged  4. Chronic diastolic heart failure: No volume overload at this time.  5. Obstructive sleep apnea: CPAP compliance encouraged  6.  Sinus node dysfunction: Status post [employer] Jude dual-chamber pacemaker functioning appropriately  Current medicines are reviewed at length with the patient today.   The patient does not have concerns regarding her medicines.  The following changes were made today: None  Labs/ tests ordered today include:  Orders Placed This Encounter  Procedures  . CT CARDIAC MORPH/PULM VEIN W/CM&W/O CA SCORE  . CT CORONARY FRACTIONAL FLOW RESERVE DATA PREP  . CT CORONARY FRACTIONAL FLOW RESERVE FLUID ANALYSIS  . Basic Metabolic Panel (BMET)  . CBC  . EKG 12-Lead     Disposition:   FU with Maxfield Gildersleeve 3 overall she has been having palpitations, shortness of breath.  He did have a cardioversion and unfortunately went back into atrial fibrillation. months  Signed, Yarexi Pawlicki Meredith Leeds, MD  01/01/2018 9:53 AM     [facility] [address] [place] [address] [phone] (office) [phone] (fax)

## 2018-01-01 NOTE — Telephone Encounter (Signed)
Follow Up:    Pt wants you to know 02-07-18 is good for her procedure

## 2018-01-01 NOTE — Patient Instructions (Addendum)
Medication Instructions:  Your physician recommends that you continue on your current medications as directed. Please refer to the Current Medication list given to you today. If you need a refill on your cardiac medications before your next appointment, please call your pharmacy.  Labwork: Please schedule lab work between 7/25 - 8/6 for: BMET & CBC *We will notify of abnormal results.  Otherwise, continue current treatment plan.  Testing/Procedures: Your physician has requested that you have cardiac CT. Cardiac computed tomography (CT) is a painless test that uses an x-ray machine to take clear, detailed pictures of your heart. For further information please visit [email or web address]. Please follow instructions below located under special instructions.-You will get a call from our office to schedule the date for this test.  Your physician has recommended that you have an ablation. Catheter ablation is a medical procedure used to treat some cardiac arrhythmias (irregular heartbeats). During catheter ablation, a long, thin, flexible tube is put into a blood vessel in your groin (upper thigh), or neck. This tube is called an ablation catheter. It is then guided to your heart through the blood vessel. Radio frequency waves destroy small areas of heart tissue where abnormal heartbeats may cause an arrhythmia to start. Please review instruction below located under special instructions.  Follow-Up: Your physician recommends that you schedule a follow-up appointment in: 4 weeks, after your procedure on 02/07/2018, with [name] in the AFib clinic.  Your physician recommends that you schedule a follow-up appointment in: 3 months, after your procedure on 02/07/2018, with [name].    Special Instructions:  CARDIAC CT INSTRUCTIONS:  Please arrive at the [facility] main entrance of [facility] [place], [place] [address] [phone]  Proceed  to the [facility] Radiology Department (First Floor).  Please follow these instructions carefully (unless otherwise directed):  Hold all erectile dysfunction medications at least 48 hours prior to test.  On the Night Before the Test: . Drink plenty of water. . Do not consume any caffeinated/decaffeinated beverages or chocolate 12 hours prior to your test. . Do not take any antihistamines 12 hours prior to your test.  On the Day of the Test: . Drink plenty of water. Do not drink any water within one hour of the test. . Do not eat any food 4 hours prior to the test. . You may take your regular medications prior to the test. . MAKE SURE TO TAKE YOUR TOPROL THE MORNING OF THE TESTING. [name] HOLD Furosemide morning of the test.  After the Test: . Drink plenty of water. . After receiving IV contrast, you may experience a mild flushed feeling. This is normal. . On occasion, you may experience a mild rash up to 24 hours after the test. This is not dangerous. If this occurs, you can take Benadryl 25 mg and increase your fluid intake. . If you experience trouble breathing, this can be serious. If it is severe call 911 IMMEDIATELY. If it is mild, please call our office. . If you take any of these medications: Glipizide/Metformin, Avandament, Glucavance, please do not take 48 hours after completing test.    ABLATION INSTRUCTIONS:  Please arrive at the [facility] main entrance of [facility] at: 5:30 a.m. Do not eat or drink after midnight prior to procedure DO NOT MISS any doses of your Eliquis. On the morning of your procedure do not take any medications. Plan for one night stay.  You will need someone to  drive you home at discharge.

## 2018-01-24 ENCOUNTER — Other Ambulatory Visit: Payer: PPO | Admitting: *Deleted

## 2018-01-24 DIAGNOSIS — I4819 Other persistent atrial fibrillation: Secondary | ICD-10-CM

## 2018-01-24 DIAGNOSIS — Z01812 Encounter for preprocedural laboratory examination: Secondary | ICD-10-CM

## 2018-01-24 DIAGNOSIS — I481 Persistent atrial fibrillation: Secondary | ICD-10-CM | POA: Diagnosis not present

## 2018-01-24 LAB — CBC
Hematocrit: 43.8 % (ref 34.0–46.6)
Hemoglobin: 14.6 g/dL (ref 11.1–15.9)
MCH: 32.4 pg (ref 26.6–33.0)
MCHC: 33.3 g/dL (ref 31.5–35.7)
MCV: 97 fL (ref 79–97)
Platelets: 276 10*3/uL (ref 150–450)
RBC: 4.51 x10E6/uL (ref 3.77–5.28)
RDW: 13 % (ref 12.3–15.4)
WBC: 7.7 10*3/uL (ref 3.4–10.8)

## 2018-01-24 LAB — BASIC METABOLIC PANEL
BUN/Creatinine Ratio: 17 (ref 12–28)
BUN: 18 mg/dL (ref 8–27)
CO2: 24 mmol/L (ref 20–29)
Calcium: 9.1 mg/dL (ref 8.7–10.3)
Chloride: 98 mmol/L (ref 96–106)
Creatinine, Ser: 1.07 mg/dL — ABNORMAL HIGH (ref 0.57–1.00)
GFR calc Af Amer: 60 mL/min/{1.73_m2} (ref >59)
GFR calc non Af Amer: 52 mL/min/{1.73_m2} — ABNORMAL LOW (ref >59)
Glucose: 153 mg/dL — ABNORMAL HIGH (ref 65–99)
Potassium: 4.1 mmol/L (ref 3.5–5.2)
Sodium: 141 mmol/L (ref 134–144)

## 2018-01-31 ENCOUNTER — Ambulatory Visit (HOSPITAL_COMMUNITY): Payer: PPO

## 2018-01-31 ENCOUNTER — Other Ambulatory Visit: Payer: Self-pay | Admitting: Cardiology

## 2018-01-31 ENCOUNTER — Ambulatory Visit (HOSPITAL_COMMUNITY)
Admission: RE | Admit: 2018-01-31 | Discharge: 2018-01-31 | Disposition: A | Discharge status: Home / Self Care | Payer: PPO | Source: Ambulatory Visit | Attending: Cardiology | Admitting: Cardiology

## 2018-01-31 ENCOUNTER — Encounter (HOSPITAL_COMMUNITY): Payer: Self-pay | Admitting: Radiology

## 2018-01-31 DIAGNOSIS — I481 Persistent atrial fibrillation: Secondary | ICD-10-CM | POA: Insufficient documentation

## 2018-01-31 DIAGNOSIS — I878 Other specified disorders of veins: Secondary | ICD-10-CM

## 2018-01-31 DIAGNOSIS — I4891 Unspecified atrial fibrillation: Secondary | ICD-10-CM | POA: Diagnosis not present

## 2018-01-31 DIAGNOSIS — R911 Solitary pulmonary nodule: Secondary | ICD-10-CM | POA: Diagnosis not present

## 2018-01-31 DIAGNOSIS — I700 Atherosclerosis of aorta: Secondary | ICD-10-CM | POA: Insufficient documentation

## 2018-01-31 DIAGNOSIS — I4819 Other persistent atrial fibrillation: Secondary | ICD-10-CM

## 2018-01-31 DIAGNOSIS — I872 Venous insufficiency (chronic) (peripheral): Secondary | ICD-10-CM | POA: Diagnosis not present

## 2018-01-31 HISTORY — PX: IR RADIOLOGY PERIPHERAL GUIDED IV START: IMG5598

## 2018-01-31 HISTORY — PX: IR US GUIDE VASC ACCESS LEFT: IMG2389

## 2018-01-31 MED ORDER — LIDOCAINE HCL (PF) 1 % IJ SOLN
INTRAMUSCULAR | Status: DC | PRN
  Administered 2018-01-31: 5 mL

## 2018-01-31 MED ORDER — IOPAMIDOL (ISOVUE-370) INJECTION 76%
80.0000 mL | Freq: Once | INTRAVENOUS | Status: AC | PRN
  Administered 2018-01-31: 80 mL via INTRAVENOUS

## 2018-01-31 MED ORDER — IOPAMIDOL (ISOVUE-370) INJECTION 76%
INTRAVENOUS | Status: AC
  Filled 2018-01-31: qty 100

## 2018-01-31 MED ORDER — METOPROLOL TARTRATE 5 MG/5ML IV SOLN
INTRAVENOUS | Status: AC
  Filled 2018-01-31: qty 5

## 2018-01-31 MED ORDER — LIDOCAINE HCL (PF) 2 % IJ SOLN
INTRAMUSCULAR | Status: AC
  Filled 2018-01-31: qty 20

## 2018-01-31 MED ORDER — METOPROLOL TARTRATE 5 MG/5ML IV SOLN
5.0000 mg | Freq: Once | INTRAVENOUS | Status: AC
  Administered 2018-01-31: 5 mg via INTRAVENOUS
  Filled 2018-01-31: qty 5

## 2018-01-31 MED ORDER — LIDOCAINE HCL 1 % IJ SOLN
INTRAMUSCULAR | Status: AC
  Filled 2018-01-31: qty 20

## 2018-01-31 NOTE — Procedures (Signed)
Under US guidance 5 french angiocath IV access obtained via left basilic vein for cardiac CT use. Medication used- 1 % lidocaine to skin/SQ tissue. No immediate complications.

## 2018-02-07 ENCOUNTER — Ambulatory Visit (HOSPITAL_COMMUNITY)
Admission: RE | Admit: 2018-02-07 | Discharge: 2018-02-08 | Disposition: A | Discharge status: Home / Self Care | Payer: PPO | Source: Ambulatory Visit | Attending: Cardiology | Admitting: Cardiology

## 2018-02-07 ENCOUNTER — Ambulatory Visit (HOSPITAL_COMMUNITY): Payer: PPO | Admitting: Certified Registered"

## 2018-02-07 ENCOUNTER — Other Ambulatory Visit: Payer: Self-pay

## 2018-02-07 ENCOUNTER — Encounter (HOSPITAL_COMMUNITY)
Admission: RE | Disposition: A | Discharge status: Home / Self Care | Payer: Self-pay | Source: Ambulatory Visit | Attending: Cardiology

## 2018-02-07 ENCOUNTER — Encounter (HOSPITAL_COMMUNITY): Payer: Self-pay | Admitting: Certified Registered"

## 2018-02-07 DIAGNOSIS — I252 Old myocardial infarction: Secondary | ICD-10-CM | POA: Insufficient documentation

## 2018-02-07 DIAGNOSIS — I130 Hypertensive heart and chronic kidney disease with heart failure and stage 1 through stage 4 chronic kidney disease, or unspecified chronic kidney disease: Secondary | ICD-10-CM | POA: Diagnosis not present

## 2018-02-07 DIAGNOSIS — Z833 Family history of diabetes mellitus: Secondary | ICD-10-CM | POA: Insufficient documentation

## 2018-02-07 DIAGNOSIS — Z8042 Family history of malignant neoplasm of prostate: Secondary | ICD-10-CM | POA: Diagnosis not present

## 2018-02-07 DIAGNOSIS — Z96652 Presence of left artificial knee joint: Secondary | ICD-10-CM | POA: Insufficient documentation

## 2018-02-07 DIAGNOSIS — N182 Chronic kidney disease, stage 2 (mild): Secondary | ICD-10-CM | POA: Insufficient documentation

## 2018-02-07 DIAGNOSIS — M1990 Unspecified osteoarthritis, unspecified site: Secondary | ICD-10-CM | POA: Diagnosis not present

## 2018-02-07 DIAGNOSIS — Z981 Arthrodesis status: Secondary | ICD-10-CM | POA: Insufficient documentation

## 2018-02-07 DIAGNOSIS — Z90722 Acquired absence of ovaries, bilateral: Secondary | ICD-10-CM | POA: Insufficient documentation

## 2018-02-07 DIAGNOSIS — I4891 Unspecified atrial fibrillation: Secondary | ICD-10-CM | POA: Diagnosis not present

## 2018-02-07 DIAGNOSIS — Z9842 Cataract extraction status, left eye: Secondary | ICD-10-CM | POA: Diagnosis not present

## 2018-02-07 DIAGNOSIS — Z8673 Personal history of transient ischemic attack (TIA), and cerebral infarction without residual deficits: Secondary | ICD-10-CM | POA: Insufficient documentation

## 2018-02-07 DIAGNOSIS — E785 Hyperlipidemia, unspecified: Secondary | ICD-10-CM | POA: Diagnosis not present

## 2018-02-07 DIAGNOSIS — Z9049 Acquired absence of other specified parts of digestive tract: Secondary | ICD-10-CM | POA: Diagnosis not present

## 2018-02-07 DIAGNOSIS — Z888 Allergy status to other drugs, medicaments and biological substances status: Secondary | ICD-10-CM | POA: Insufficient documentation

## 2018-02-07 DIAGNOSIS — Z8249 Family history of ischemic heart disease and other diseases of the circulatory system: Secondary | ICD-10-CM | POA: Diagnosis not present

## 2018-02-07 DIAGNOSIS — Z87891 Personal history of nicotine dependence: Secondary | ICD-10-CM | POA: Diagnosis not present

## 2018-02-07 DIAGNOSIS — Z955 Presence of coronary angioplasty implant and graft: Secondary | ICD-10-CM | POA: Insufficient documentation

## 2018-02-07 DIAGNOSIS — Z9841 Cataract extraction status, right eye: Secondary | ICD-10-CM | POA: Insufficient documentation

## 2018-02-07 DIAGNOSIS — Z9889 Other specified postprocedural states: Secondary | ICD-10-CM | POA: Diagnosis not present

## 2018-02-07 DIAGNOSIS — Z90710 Acquired absence of both cervix and uterus: Secondary | ICD-10-CM | POA: Diagnosis not present

## 2018-02-07 DIAGNOSIS — I5032 Chronic diastolic (congestive) heart failure: Secondary | ICD-10-CM | POA: Diagnosis not present

## 2018-02-07 DIAGNOSIS — Z859 Personal history of malignant neoplasm, unspecified: Secondary | ICD-10-CM | POA: Diagnosis not present

## 2018-02-07 DIAGNOSIS — I481 Persistent atrial fibrillation: Secondary | ICD-10-CM | POA: Diagnosis not present

## 2018-02-07 DIAGNOSIS — I509 Heart failure, unspecified: Secondary | ICD-10-CM | POA: Diagnosis not present

## 2018-02-07 DIAGNOSIS — I4819 Other persistent atrial fibrillation: Secondary | ICD-10-CM | POA: Diagnosis present

## 2018-02-07 DIAGNOSIS — G4733 Obstructive sleep apnea (adult) (pediatric): Secondary | ICD-10-CM | POA: Diagnosis not present

## 2018-02-07 HISTORY — PX: ATRIAL FIBRILLATION ABLATION: EP1191

## 2018-02-07 LAB — POCT ACTIVATED CLOTTING TIME
Activated Clotting Time: 274 seconds
Activated Clotting Time: 290 seconds
Activated Clotting Time: 153 seconds

## 2018-02-07 SURGERY — ATRIAL FIBRILLATION ABLATION
Anesthesia: General

## 2018-02-07 MED ORDER — HEPARIN SODIUM (PORCINE) 1000 UNIT/ML IJ SOLN
INTRAMUSCULAR | Status: DC | PRN
  Administered 2018-02-07: 1000 [IU] via INTRAVENOUS

## 2018-02-07 MED ORDER — DOBUTAMINE IN D5W 4-5 MG/ML-% IV SOLN
INTRAVENOUS | Status: DC | PRN
  Administered 2018-02-07: 20 ug/kg/min via INTRAVENOUS

## 2018-02-07 MED ORDER — SODIUM CHLORIDE 0.9 % IV SOLN: 250.0000 mL | INTRAVENOUS | Status: DC | PRN

## 2018-02-07 MED ORDER — MIDAZOLAM HCL 5 MG/5ML IJ SOLN
INTRAMUSCULAR | Status: DC | PRN
  Administered 2018-02-07: 2 mg via INTRAVENOUS

## 2018-02-07 MED ORDER — ACETAMINOPHEN 500 MG PO TABS
500.0000 mg | ORAL_TABLET | Freq: Every day | ORAL | Status: DC
  Administered 2018-02-07: 500 mg via ORAL
  Filled 2018-02-07: qty 1

## 2018-02-07 MED ORDER — HEPARIN (PORCINE) IN NACL 1000-0.9 UT/500ML-% IV SOLN
INTRAVENOUS | Status: AC
  Filled 2018-02-07: qty 500

## 2018-02-07 MED ORDER — ATORVASTATIN CALCIUM 40 MG PO TABS
40.0000 mg | ORAL_TABLET | Freq: Every day | ORAL | Status: DC
  Administered 2018-02-07 – 2018-02-08 (×2): 40 mg via ORAL
  Filled 2018-02-07 (×2): qty 1

## 2018-02-07 MED ORDER — SODIUM CHLORIDE 0.9% FLUSH: 3.0000 mL | INTRAVENOUS | Status: DC | PRN

## 2018-02-07 MED ORDER — DILTIAZEM HCL ER COATED BEADS 120 MG PO CP24
120.0000 mg | ORAL_CAPSULE | Freq: Every day | ORAL | Status: DC
  Administered 2018-02-07 – 2018-02-08 (×2): 120 mg via ORAL
  Filled 2018-02-07 (×2): qty 1

## 2018-02-07 MED ORDER — VITAMIN E 180 MG (400 UNIT) PO CAPS
800.0000 [IU] | ORAL_CAPSULE | Freq: Every day | ORAL | Status: DC
  Administered 2018-02-08: 800 [IU] via ORAL
  Filled 2018-02-07 (×2): qty 2

## 2018-02-07 MED ORDER — ONDANSETRON HCL 4 MG/2ML IJ SOLN: 4.0000 mg | Freq: Four times a day (QID) | INTRAMUSCULAR | Status: DC | PRN

## 2018-02-07 MED ORDER — ADULT MULTIVITAMIN W/MINERALS CH
1.0000 | ORAL_TABLET | Freq: Every day | ORAL | Status: DC
  Administered 2018-02-07 – 2018-02-08 (×2): 1 via ORAL
  Filled 2018-02-07 (×2): qty 1

## 2018-02-07 MED ORDER — LACTATED RINGERS IV SOLN
INTRAVENOUS | Status: DC | PRN
  Administered 2018-02-07: 10:00:00 via INTRAVENOUS

## 2018-02-07 MED ORDER — SODIUM CHLORIDE 0.9% FLUSH
3.0000 mL | Freq: Two times a day (BID) | INTRAVENOUS | Status: DC
  Administered 2018-02-07 (×2): 3 mL via INTRAVENOUS

## 2018-02-07 MED ORDER — LYSINE 1000 MG PO TABS: 1000.0000 mg | ORAL_TABLET | Freq: Every day | ORAL | Status: DC

## 2018-02-07 MED ORDER — BUPIVACAINE HCL (PF) 0.25 % IJ SOLN
INTRAMUSCULAR | Status: AC
  Filled 2018-02-07: qty 30

## 2018-02-07 MED ORDER — APIXABAN 5 MG PO TABS
5.0000 mg | ORAL_TABLET | Freq: Two times a day (BID) | ORAL | Status: DC
  Administered 2018-02-07 – 2018-02-08 (×2): 5 mg via ORAL
  Filled 2018-02-07 (×2): qty 1

## 2018-02-07 MED ORDER — DOBUTAMINE IN D5W 4-5 MG/ML-% IV SOLN
INTRAVENOUS | Status: AC
  Filled 2018-02-07: qty 250

## 2018-02-07 MED ORDER — SODIUM CHLORIDE 0.9 % IV SOLN
INTRAVENOUS | Status: DC
  Administered 2018-02-07: 07:00:00 via INTRAVENOUS

## 2018-02-07 MED ORDER — ACETAMINOPHEN 325 MG PO TABS: 650.0000 mg | ORAL_TABLET | ORAL | Status: DC | PRN

## 2018-02-07 MED ORDER — ONDANSETRON HCL 4 MG/2ML IJ SOLN
INTRAMUSCULAR | Status: DC | PRN
  Administered 2018-02-07: 4 mg via INTRAVENOUS

## 2018-02-07 MED ORDER — ACETAMINOPHEN 500 MG PO TABS
500.0000 mg | ORAL_TABLET | ORAL | Status: DC
  Administered 2018-02-08: 500 mg via ORAL
  Filled 2018-02-07: qty 1

## 2018-02-07 MED ORDER — PHENYLEPHRINE 40 MCG/ML (10ML) SYRINGE FOR IV PUSH (FOR BLOOD PRESSURE SUPPORT)
PREFILLED_SYRINGE | INTRAVENOUS | Status: DC | PRN
  Administered 2018-02-07 (×2): 80 ug via INTRAVENOUS

## 2018-02-07 MED ORDER — LORATADINE 10 MG PO TABS
10.0000 mg | ORAL_TABLET | Freq: Every day | ORAL | Status: DC
  Administered 2018-02-07 – 2018-02-08 (×2): 10 mg via ORAL
  Filled 2018-02-07 (×2): qty 1

## 2018-02-07 MED ORDER — ROCURONIUM BROMIDE 10 MG/ML (PF) SYRINGE
PREFILLED_SYRINGE | INTRAVENOUS | Status: DC | PRN
  Administered 2018-02-07: 30 mg via INTRAVENOUS

## 2018-02-07 MED ORDER — SODIUM CHLORIDE 0.9 % IV SOLN
INTRAVENOUS | Status: DC | PRN
  Administered 2018-02-07: 50 ug/min via INTRAVENOUS

## 2018-02-07 MED ORDER — AMIODARONE HCL 200 MG PO TABS
400.0000 mg | ORAL_TABLET | Freq: Two times a day (BID) | ORAL | Status: DC
  Administered 2018-02-08: 400 mg via ORAL
  Filled 2018-02-07 (×2): qty 2

## 2018-02-07 MED ORDER — HEPARIN SODIUM (PORCINE) 1000 UNIT/ML IJ SOLN
INTRAMUSCULAR | Status: DC | PRN
  Administered 2018-02-07: 15000 [IU] via INTRAVENOUS
  Administered 2018-02-07: 6000 [IU] via INTRAVENOUS

## 2018-02-07 MED ORDER — BUPIVACAINE HCL (PF) 0.25 % IJ SOLN
INTRAMUSCULAR | Status: DC | PRN
  Administered 2018-02-07: 30 mL

## 2018-02-07 MED ORDER — POLYETHYL GLYCOL-PROPYL GLYCOL 0.4-0.3 % OP SOLN: 1.0000 [drp] | Freq: Every evening | OPHTHALMIC | Status: DC | PRN

## 2018-02-07 MED ORDER — METOPROLOL SUCCINATE ER 50 MG PO TB24
50.0000 mg | ORAL_TABLET | Freq: Two times a day (BID) | ORAL | Status: DC
  Administered 2018-02-07: 50 mg via ORAL
  Filled 2018-02-07 (×2): qty 1

## 2018-02-07 MED ORDER — DEXAMETHASONE SODIUM PHOSPHATE 10 MG/ML IJ SOLN
INTRAMUSCULAR | Status: DC | PRN
  Administered 2018-02-07: 4 mg via INTRAVENOUS

## 2018-02-07 MED ORDER — PROPOFOL 10 MG/ML IV BOLUS
INTRAVENOUS | Status: DC | PRN
  Administered 2018-02-07: 170 mg via INTRAVENOUS
  Administered 2018-02-07: 30 mg via INTRAVENOUS

## 2018-02-07 MED ORDER — FENTANYL CITRATE (PF) 100 MCG/2ML IJ SOLN
INTRAMUSCULAR | Status: DC | PRN
  Administered 2018-02-07 (×2): 100 ug via INTRAVENOUS

## 2018-02-07 MED ORDER — SUCCINYLCHOLINE CHLORIDE 200 MG/10ML IV SOSY
PREFILLED_SYRINGE | INTRAVENOUS | Status: DC | PRN
  Administered 2018-02-07: 120 mg via INTRAVENOUS

## 2018-02-07 MED ORDER — DIPHENHYDRAMINE-APAP (SLEEP) 25-500 MG PO TABS: 1.0000 | ORAL_TABLET | Freq: Every day | ORAL | Status: DC

## 2018-02-07 MED ORDER — PROTAMINE SULFATE 10 MG/ML IV SOLN
INTRAVENOUS | Status: DC | PRN
  Administered 2018-02-07: 40 mg via INTRAVENOUS

## 2018-02-07 MED ORDER — FLUTICASONE PROPIONATE 50 MCG/ACT NA SUSP
1.0000 | Freq: Every evening | NASAL | Status: DC | PRN
  Filled 2018-02-07: qty 16

## 2018-02-07 MED ORDER — HEPARIN SODIUM (PORCINE) 1000 UNIT/ML IJ SOLN
INTRAMUSCULAR | Status: AC
  Filled 2018-02-07: qty 1

## 2018-02-07 MED ORDER — VITAMIN C 500 MG PO TABS
1000.0000 mg | ORAL_TABLET | Freq: Every day | ORAL | Status: DC
  Administered 2018-02-07 – 2018-02-08 (×2): 1000 mg via ORAL
  Filled 2018-02-07 (×2): qty 2

## 2018-02-07 MED ORDER — FLUOXETINE HCL 20 MG PO CAPS
20.0000 mg | ORAL_CAPSULE | Freq: Every morning | ORAL | Status: DC
  Administered 2018-02-07 – 2018-02-08 (×2): 20 mg via ORAL
  Filled 2018-02-07 (×2): qty 1

## 2018-02-07 MED ORDER — DIPHENHYDRAMINE HCL 25 MG PO CAPS
25.0000 mg | ORAL_CAPSULE | Freq: Every day | ORAL | Status: DC
  Administered 2018-02-07: 25 mg via ORAL
  Filled 2018-02-07: qty 1

## 2018-02-07 MED ORDER — LIDOCAINE 2% (20 MG/ML) 5 ML SYRINGE
INTRAMUSCULAR | Status: DC | PRN
  Administered 2018-02-07: 50 mg via INTRAVENOUS

## 2018-02-07 MED ORDER — HYPROMELLOSE (GONIOSCOPIC) 2.5 % OP SOLN
1.0000 [drp] | Freq: Every evening | OPHTHALMIC | Status: DC | PRN
  Filled 2018-02-07: qty 15

## 2018-02-07 MED ORDER — NITROGLYCERIN 0.4 MG SL SUBL: 0.4000 mg | SUBLINGUAL_TABLET | SUBLINGUAL | Status: DC | PRN

## 2018-02-07 MED ORDER — VITAMIN B-12 1000 MCG PO TABS
5000.0000 ug | ORAL_TABLET | Freq: Every day | ORAL | Status: DC
  Administered 2018-02-07: 5000 ug via ORAL
  Filled 2018-02-07: qty 5

## 2018-02-07 MED ORDER — TORSEMIDE 20 MG PO TABS
40.0000 mg | ORAL_TABLET | Freq: Two times a day (BID) | ORAL | Status: DC
  Administered 2018-02-07 – 2018-02-08 (×2): 40 mg via ORAL
  Filled 2018-02-07 (×3): qty 2

## 2018-02-07 MED ORDER — HEPARIN (PORCINE) IN NACL 1000-0.9 UT/500ML-% IV SOLN
INTRAVENOUS | Status: DC | PRN
  Administered 2018-02-07 (×5): 500 mL

## 2018-02-07 SURGICAL SUPPLY — 20 items
BAG SNAP BAND KOVER 36X36 (MISCELLANEOUS) ×1 IMPLANT
BLANKET WARM UNDERBOD FULL ACC (MISCELLANEOUS) ×1 IMPLANT
CATH MAPPNG PENTARAY F 2-6-2MM (CATHETERS) IMPLANT
CATH SMTCH THERMOCOOL SF DF (CATHETERS) ×1 IMPLANT
CATH SOUNDSTAR 3D IMAGING (CATHETERS) ×1 IMPLANT
CATH WEBSTER BI DIR CS D-F CRV (CATHETERS) ×1 IMPLANT
COVER SWIFTLINK CONNECTOR (BAG) ×2 IMPLANT
PACK EP LATEX FREE (CUSTOM PROCEDURE TRAY) ×2
PACK EP LF (CUSTOM PROCEDURE TRAY) ×1 IMPLANT
PAD DEFIB LIFELINK (PAD) ×1 IMPLANT
PATCH CARTO3 (PAD) ×1 IMPLANT
PENTARAY F 2-6-2MM (CATHETERS) ×2
SHEATH AVANTI 11F 11CM (SHEATH) ×1 IMPLANT
SHEATH BAYLIS SUREFLEX  M 8.5 (SHEATH) ×2
SHEATH BAYLIS SUREFLEX M 8.5 (SHEATH) IMPLANT
SHEATH BAYLIS TRANSSEPTAL 98CM (NEEDLE) ×1 IMPLANT
SHEATH PINNACLE 7F 10CM (SHEATH) ×1 IMPLANT
SHEATH PINNACLE 8F 10CM (SHEATH) ×2 IMPLANT
SHEATH PINNACLE 9F 10CM (SHEATH) ×2 IMPLANT
TUBING SMART ABLATE COOLFLOW (TUBING) ×1 IMPLANT

## 2018-02-07 NOTE — Anesthesia Procedure Notes (Signed)
Procedure Name: Intubation Date/Time: 02/07/2018 7:44 AM Performed by: Moshe Salisbury, CRNA Pre-anesthesia Checklist: Patient identified, Emergency Drugs available, Suction available and Patient being monitored Patient Re-evaluated:Patient Re-evaluated prior to induction Oxygen Delivery Method: Circle System Utilized Preoxygenation: Pre-oxygenation with 100% oxygen Induction Type: IV induction Ventilation: Mask ventilation without difficulty Laryngoscope Size: Mac and 3 Grade View: Grade III Tube type: Oral Tube size: 7.5 mm Number of attempts: 1 Airway Equipment and Method: Stylet and Oral airway Placement Confirmation: ETT inserted through vocal cords under direct vision,  positive ETCO2 and breath sounds checked- equal and bilateral Secured at: 21 cm Tube secured with: Tape Dental Injury: Teeth and Oropharynx as per pre-operative assessment

## 2018-02-07 NOTE — Transfer of Care (Signed)
Immediate Anesthesia Transfer of Care Note  Patient: [name]  Procedure(s) Performed: ATRIAL FIBRILLATION ABLATION (N/A )  Patient Location: Cath Lab  Anesthesia Type:General  Level of Consciousness: awake, oriented and patient cooperative  Airway & Oxygen Therapy: Patient Spontanous Breathing and Patient connected to nasal cannula oxygen  Post-op Assessment: Report given to RN, Post -op Vital signs reviewed and stable and Patient moving all extremities  Post vital signs: Reviewed and stable  Last Vitals:  Vitals Value Taken Time  BP 102/50 02/07/2018 10:55 AM  Temp 36.2 C 02/07/2018 10:54 AM  Pulse 64 02/07/2018 10:58 AM  Resp 17 02/07/2018 10:58 AM  SpO2 92 % 02/07/2018 10:58 AM  Vitals shown include unvalidated device data.  Last Pain:  Vitals:   02/07/18 1054  TempSrc: Temporal  PainSc: 0-No pain         Complications: No apparent anesthesia complications

## 2018-02-07 NOTE — Progress Notes (Signed)
[employer], [employer], notified that a PPM interrogation will be needed post procedure.

## 2018-02-07 NOTE — Anesthesia Preprocedure Evaluation (Addendum)
Anesthesia Evaluation  Patient identified by MRN, date of birth, ID band Patient awake    Reviewed: Allergy & Precautions, NPO status , Patient's Chart, lab work & pertinent test results  Airway Mallampati: I  TM Distance: >3 FB Neck ROM: Full    Dental  (+) Teeth Intact, Dental Advisory Given   Pulmonary sleep apnea and Continuous Positive Airway Pressure Ventilation , former smoker,    breath sounds clear to auscultation       Cardiovascular hypertension, Pt. on medications and Pt. on home beta blockers + CAD, + Past MI, + Peripheral Vascular Disease and +CHF  + dysrhythmias + pacemaker  Rhythm:Irregular Rate:Abnormal     Neuro/Psych Anxiety Depression TIACVA    GI/Hepatic negative GI ROS, Neg liver ROS,   Endo/Other  diabetes  Renal/GU CRFRenal disease     Musculoskeletal   Abdominal Normal abdominal exam  (+)   Peds  Hematology   Anesthesia Other Findings - HLD  Reproductive/Obstetrics                           Lab Results  Component Value Date   WBC 7.7 01/24/2018   HGB 14.6 01/24/2018   HCT 43.8 01/24/2018   MCV 97 01/24/2018   PLT 276 01/24/2018   Lab Results  Component Value Date   CREATININE 1.07 (H) 01/24/2018   BUN 18 01/24/2018   NA 141 01/24/2018   K 4.1 01/24/2018   CL 98 01/24/2018   CO2 24 01/24/2018   Lab Results  Component Value Date   INR 1.0 05/08/2017   INR 1.51 04/13/2017   INR 1.1 08/02/2016   EKG: atrial fibrillation.  Anesthesia Physical Anesthesia Plan  ASA: III  Anesthesia Plan: General   Post-op Pain Management:    Induction: Intravenous  PONV Risk Score and Plan: 4 or greater and Ondansetron, Dexamethasone, Midazolam and Treatment may vary due to age or medical condition  Airway Management Planned: Oral ETT  Additional Equipment: None  Intra-op Plan:   Post-operative Plan: Extubation in OR  Informed Consent: I have reviewed the  patients History and Physical, chart, labs and discussed the procedure including the risks, benefits and alternatives for the proposed anesthesia with the patient or authorized representative who has indicated his/her understanding and acceptance.   Dental advisory given  Plan Discussed with: CRNA  Anesthesia Plan Comments:        Anesthesia Quick Evaluation

## 2018-02-07 NOTE — Progress Notes (Signed)
PHARMACIST - PHYSICIAN ORDER COMMUNICATION  CONCERNING: P&T Medication Policy on Herbal Medications  DESCRIPTION:  This patient's order for:  Lysine  has been noted.  This product(s) is classified as an "herbal" or natural product. Due to a lack of definitive safety studies or FDA approval, nonstandard manufacturing practices, plus the potential risk of unknown drug-drug interactions while on inpatient medications, the Pharmacy and Therapeutics Committee does not permit the use of "herbal" or natural products of this type within [facility].   ACTION TAKEN: The pharmacy department is unable to verify this order at this time and your patient has been informed of this safety policy. Please reevaluate patient's clinical condition at discharge and address if the herbal or natural product(s) should be resumed at that time.   Qasim Diveley A. Levada Dy, PharmD, [place] Pager: [phone] Please utilize Amion for appropriate phone number to reach the unit pharmacist ([place])

## 2018-02-07 NOTE — Progress Notes (Signed)
Site area: RFV x 2-LFV x2 Site Prior to Removal:  Level 0/0 Pressure Applied For:30 min each Manual:   yes Patient Status During Pull:  stable Post Pull Site:  Level 0/0 Post Pull Instructions Given:  yes Post Pull Pulses Present: palpable Dressing Applied:  clear Bedrest begins @ 1220 till 1820 Comments:

## 2018-02-07 NOTE — Discharge Summary (Addendum)
ELECTROPHYSIOLOGY PROCEDURE DISCHARGE SUMMARY    Patient ID: [name],  MRN: [id number], DOB/AGE: [birth year] 72 y.o.  Admit date: 02/07/2018 Discharge date: 02/08/18  Primary Care Physician: [name], MD  Primary Cardiologist: Dr. Acie Fredrickson Electrophysiologist: Dr. Curt Bears  Primary Discharge Diagnosis:  1. Persistant AFib     CHA2DS2Vasc is 7, on Eliquis, appropriately dosed  Secondary Discharge Diagnosis:  1. CAD (remote PCI) 2.Tach-brady w/PPM 3. CKD (II) 4. HTN 5. Chronic CHF (diastolic) 6. OSA w/CPAP  Procedures This Admission:  1.  Electrophysiology study and radiofrequency catheter ablation on 02/07/18 by Dr Curt Bears.   This study demonstrated      Brief HPI: [name] is a 72 y.o. female with a history of persistent atrial fibrillation.  She failed medical therapy with Tikosyn. Risks, benefits, and alternatives to catheter ablation of atrial fibrillation were reviewed with the patient who wished to proceed.  The patient underwent cardiac CT prior to the procedure which demonstrated normal LV function and no LAA thrombus.    Hospital Course:  The patient was admitted and underwent EPS/RFCA of atrial fibrillation with details as outlined above.  She was monitored on telemetry overnight which demonstrated Aflutter/ATach.  B/l groin was without complication on the day of discharge.  The patient feels well this morning, no CP or SOB, she has ambulated without difficulty.  She was examined by Dr. Curt Bears and considered to be stable for discharge.  BSBP running low 100's, [name] hold her toprol for now.  Wound care and restrictions were reviewed with the patient.  The patient [name] be seen back by Roderic Palau, NP in 4 weeks and Dr Curt Bears in 12 weeks for post ablation follow up.   We are starting her on amiodarone 400mg  BID, early follow p with the AFib clinic for next week has been arranged for amiodarone titration and if still in AFlutter, plan would be to arrange DCCV.       Physical Exam: Vitals:   02/08/18 0642 02/08/18 0820 02/08/18 0847 02/08/18 1000  BP: 100/77 93/60 96/60  112/74  Pulse: 66     Resp: 18     Temp: 98.2 F (36.8 C)     TempSrc: Oral     SpO2: 92%     Weight: 112.4 kg     Height:        GEN- The patient is well appearing, alert and oriented x 3 today.   HEENT: normocephalic, atraumatic; sclera clear, conjunctiva pink; hearing intact; oropharynx clear; neck supple  Lungs- CTA b/l, normal work of breathing.  No wheezes, rales, rhonchi Heart- iRRR, no murmurs, rubs or gallops  GI- soft, non-tender, non-distended Extremities- no clubbing, cyanosis, or edema; b/l groin sites are without hematoma/bruit MS- no significant deformity or atrophy Skin- warm and dry, no rash or lesion Psych- euthymic mood, full affect Neuro- strength and sensation are intact   Labs:   Lab Results  Component Value Date   WBC 7.7 01/24/2018   HGB 14.6 01/24/2018   HCT 43.8 01/24/2018   MCV 97 01/24/2018   PLT 276 01/24/2018   No results for input(s): NA, K, CL, CO2, BUN, CREATININE, CALCIUM, PROT, BILITOT, ALKPHOS, ALT, AST, GLUCOSE in the last 168 hours.  Invalid input(s): LABALBU   Discharge Medications:  Allergies as of 02/08/2018      Reactions   Adhesive [tape] Rash, Other (See Comments)   Allergic to EKG stickers and defibrillation pads.      Medication List  STOP taking these medications   metoprolol succinate 50 MG 24 hr tablet Commonly known as:  TOPROL-XL     TAKE these medications   acetaminophen 500 MG tablet Commonly known as:  TYLENOL Take 500 mg by mouth every morning.   amiodarone 400 MG tablet Commonly known as:  PACERONE Take 1 tablet (400 mg total) by mouth 2 (two) times daily.   atorvastatin 40 MG tablet Commonly known as:  LIPITOR TAKE 1 TABLET BY MOUTH EVERY DAY   B-12 5000 MCG Caps Take 5,000 mcg by mouth daily.   CALCIUM 600+D PO Take 1 tablet by mouth daily.   diltiazem 120 MG 24 hr  capsule Commonly known as:  CARDIZEM CD TAKE 1 CAPSULE (120 MG TOTAL) BY MOUTH DAILY.   diphenhydramine-acetaminophen 25-500 MG Tabs tablet Commonly known as:  TYLENOL PM Take 1 tablet by mouth at bedtime.   ELIQUIS 5 MG Tabs tablet Generic drug:  apixaban TAKE 1 TABLET BY MOUTH TWICE A DAY   FLUoxetine 20 MG capsule Commonly known as:  PROZAC Take 1 capsule (20 mg total) by mouth every morning.   fluticasone 50 MCG/ACT nasal spray Commonly known as:  FLONASE Place 1 spray into both nostrils at bedtime as needed for allergies.   KLOR-CON M20 20 MEQ tablet Generic drug:  potassium chloride SA TAKE 1 TABLET BY MOUTH TWICE A DAY What changed:    how much to take  how to take this  when to take this   loratadine 10 MG tablet Commonly known as:  CLARITIN TAKE 1 TABLET BY MOUTH EVERY DAY   Lysine 1000 MG Tabs Take 1,000 mg by mouth daily.   multivitamin with minerals Tabs tablet Take 1 tablet by mouth daily. Centrum   nitroGLYCERIN 0.4 MG SL tablet Commonly known as:  NITROSTAT Place 1 tablet (0.4 mg total) under the tongue every 5 (five) minutes x 3 doses as needed for chest pain.   SYSTANE 0.4-0.3 % Soln Generic drug:  Polyethyl Glycol-Propyl Glycol Place 1 drop into both eyes at bedtime as needed (for dry eyes).   torsemide 20 MG tablet Commonly known as:  DEMADEX Take 40 mg by mouth 2 (two) times daily.   valACYclovir 1000 MG tablet Commonly known as:  VALTREX TAKE 2 TABLETS BY MOUTH AND REPEAT IN 12 HOURS FOR COLD SORES What changed:  See the new instructions.   vitamin C 1000 MG tablet Take 1,000 mg by mouth daily.   vitamin E 400 UNIT capsule Take 800 Units by mouth daily.       Disposition:  Home  Discharge Instructions    Diet - low sodium heart healthy   Complete by:  As directed    Increase activity slowly   Complete by:  As directed      Follow-up Information    [facility] Follow up on 02/14/2018.    Specialty:  Cardiology Why:  11:30AM Contact information: [address] [id number] [place] [phone]       Nahser, Wonda Cheng, MD Follow up on 03/07/2018.   Specialty:  Cardiology Why:  2:40PM Contact information: [place] [address] [phone]        Constance Haw, MD Follow up on 05/14/2018.   Specialty:  Cardiology Why:  10:30AM Contact information: [place] [address] [phone]           Duration of Discharge Encounter: Greater than  30 minutes including physician time.  Signed, Tommye Standard, PA-C 02/08/2018 10:16 AM  I have seen and examined this patient with Lorelei Pont.  Agree with above, note added to reflect my findings.  On exam, tachycardic, regular, no murmurs, lungs clear.  Patient status post ablation for persistent atrial fibrillation.  Patient went into atrial flutter post ablation yesterday.  She is comfortable, feeling well.  We Mailey Landstrom plan for discharge today.  We Kristi Norment start her on p.o. amiodarone 400 mg twice a day.  She Keyshawna Prouse follow-up in A. fib clinic which can decrease the amiodarone dose to 200 mg.  If she does not return to sinus rhythm, she may need cardioversion.  Chrys Landgrebe M. Anastasya Jewell MD 02/08/2018 10:17 AM

## 2018-02-07 NOTE — Progress Notes (Signed)
Delay due to issues with ultrasound not recognizing catheter.

## 2018-02-07 NOTE — Discharge Instructions (Signed)
Post procedure care instructions: No driving for 4 days. No lifting over 5 lbs for 1 week. No vigorous or sexual activity for 1 week. You may return to work on 02/14/18. Keep procedure site clean & dry. If you notice increased pain, swelling, bleeding or pus, call/return!  You may shower, but no soaking baths/hot tubs/pools for 1 week.    You have an appointment set up with the [facility].  Multiple studies have shown that being followed by a dedicated atrial fibrillation clinic in addition to the standard care you receive from your other physicians improves health. We believe that enrollment in the atrial fibrillation clinic will allow [place] to better care for you.   The phone number to the [facility] is [phone]. The clinic is staffed Monday through Friday from 8:30am to 5pm.  Parking Directions: The clinic is located in the Heart and Vascular Building connected to [facility]. 1)From [address] turn on to [address] and go to the 3rd entrance  (Heart and Vascular entrance) on the right. 2)Look to the right for Heart &Vascular Parking Garage. 3)A code for the entrance is required please call the clinic to receive this.   4)Take the elevators to the 1st floor. Registration is in the room with the glass walls at the end of the hallway.  If you have any trouble parking or locating the clinic, please dont hesitate to call [phone].

## 2018-02-07 NOTE — H&P (Addendum)
Electrophysiology Office Note   Date:  02/07/2018   ID:  [name], DOB May 23, 1946, MRN [id number]  PCP:  [name], MD  Primary Electrophysiologist: Gaye Alken, MD     History of Present Illness: [name] is a 72 y.o. female who presents today for electrophysiology evaluation.   She has a history of coronary artery disease status post stent to the RCA in 2004, CHF, atrial fibrillation with tachybradycardia syndrome status post pacemaker, CKD, hypertension, hyperlipidemia, inferior wall MI, sleep apnea on CPAP, stroke. She is maintaining sinus rhythm on CPAP and dofetilide until she got an infection and when in atrial fibrillation. Had ablation on 09/29/16.  Unfortunately, over the last few months, she has required 2 cardioversions.  Today, denies symptoms of palpitations, chest pain, shortness of breath, orthopnea, PND, lower extremity edema, claudication, dizziness, presyncope, syncope, bleeding, or neurologic sequela. The patient is tolerating medications without difficulties. Plan for AF ablation today.    Past Medical History:  Diagnosis Date  . Anticoagulant long-term use    pradaxa  . Anxiety   . Arthritis    "fingers, lower back" (04/23/2017   . CAD (coronary artery disease) 5643,3295   post PTCA with bare-metal stenting to mid RCA in December 2004     . CHF (congestive heart failure) ([place])   . Chronic atrial fibrillation ([place]) 06/2007   Tachybradycardia pacemaker  . Chronic kidney disease    10% function - ?R, other kidney is compensating    . CVA (cerebral vascular accident) [facility]) [id number]   denies residual on 04/23/2017  . Depression   . Diplopia 06/19/2008   Qualifier: Diagnosis of  By: [name] MD, [name]   . Dysrhythmia    ATRIAL FIBRILATION  . Edema of lower extremity   . Hyperlipidemia   . Hypertension   . Inferior myocardial infarction [facility])    acute inferior wall mi/other medical hx  . Myocardial infarction ([place]) [id number]  .  OSA on CPAP    last test- 2010  . Pacemaker   . Pneumonia 2014   tx. ----  [facility]  . Pulmonary hypertension ([place])    moderate pulmonary hypertension by 10/2016 echo and 10/2013 cardiac cath  . Shortness of breath   . Skin cancer    "cut off right Majeed; burned off LLE" (04/23/2017)  . Spondylolisthesis   . TIA (transient ischemic attack) 2008  . Unspecified hemorrhoids without mention of complication 12/31/1599   Colonoscopy--Dr. Carlean Purl    Past Surgical History:  Procedure Laterality Date  . APPENDECTOMY  1984  . ATRIAL FIBRILLATION ABLATION N/A 09/29/2016   Procedure: Atrial Fibrillation Ablation;  Surgeon: Kallan Bischoff Meredith Leeds, MD;  Location: [place] CV LAB;  Service: Cardiovascular;  Laterality: N/A;  . BACK SURGERY    . CARDIOVERSION N/A 09/12/2017   Procedure: CARDIOVERSION;  Surgeon: Jerline Pain, MD;  Location: [facility] ENDOSCOPY;  Service: Cardiovascular;  Laterality: N/A;  . CARDIOVERSION N/A 12/13/2017   Procedure: CARDIOVERSION;  Surgeon: Sanda Klein, MD;  Location: [place];  Service: Cardiovascular;  Laterality: N/A;  . CATARACT EXTRACTION W/ INTRAOCULAR LENS  IMPLANT, BILATERAL Bilateral 01/15/2017- 03/2017  . CORONARY ANGIOPLASTY  X 2  . CORONARY ANGIOPLASTY WITH STENT PLACEMENT  1998; ~ 2007; ?date   "1 stent; replaced stent; not sure when I got the last stent" (04/23/2017)  . DOPPLER ECHOCARDIOGRAPHY  2009  . ELECTROPHYSIOLOGIC STUDY N/A 03/31/2016   Procedure: Cardioversion;  Surgeon: Evans Lance, MD;  Location: [facility]  INVASIVE CV LAB;  Service: Cardiovascular;  Laterality: N/A;  . ELECTROPHYSIOLOGIC STUDY N/A 08/04/2016   Procedure: Cardioversion;  Surgeon: Evans Lance, MD;  Location: [place] CV LAB;  Service: Cardiovascular;  Laterality: N/A;  . INSERT / REPLACE / REMOVE PACEMAKER  06/2007  . IR RADIOLOGY PERIPHERAL GUIDED IV START  01/31/2018  . IR US GUIDE VASC ACCESS LEFT  01/31/2018  . JOINT REPLACEMENT    . LAPAROSCOPIC CHOLECYSTECTOMY  1994    . LEFT AND RIGHT HEART CATHETERIZATION WITH CORONARY ANGIOGRAM N/A 10/06/2013   Procedure: LEFT AND RIGHT HEART CATHETERIZATION WITH CORONARY ANGIOGRAM;  Surgeon: Troy Sine, MD;  Location: [facility] CATH LAB;  Service: Cardiovascular;  Laterality: N/A;  . LEFT OOPHORECTOMY Left ~ 1989  . POSTERIOR LUMBAR FUSION  2000s - 04/2015 X 3   L3-4; L4-5; L2-3; Dr Trenton Gammon  . RIGHT/LEFT HEART CATH AND CORONARY ANGIOGRAPHY N/A 05/09/2017   Procedure: RIGHT/LEFT HEART CATH AND CORONARY ANGIOGRAPHY;  Surgeon: Sherren Mocha, MD;  Location: [place] CV LAB;  Service: Cardiovascular;  Laterality: N/A;  . SKIN CANCER EXCISION Right    [place]  . TEE WITHOUT CARDIOVERSION N/A 09/29/2016   Procedure: TRANSESOPHAGEAL ECHOCARDIOGRAM (TEE);  Surgeon: Jerline Pain, MD;  Location: [place];  Service: Cardiovascular;  Laterality: N/A;  . TOTAL ABDOMINAL HYSTERECTOMY  1984   "uterus & right ovary"  . TOTAL KNEE ARTHROPLASTY Left 04/23/2017  . TOTAL KNEE ARTHROPLASTY Left 04/23/2017   Procedure: TOTAL KNEE ARTHROPLASTY;  Surgeon: Frederik Pear, MD;  Location: [place];  Service: Orthopedics;  Laterality: Left;  . ULTRASOUND GUIDANCE FOR VASCULAR ACCESS  05/09/2017   Procedure: Ultrasound Guidance For Vascular Access;  Surgeon: Sherren Mocha, MD;  Location: [place] CV LAB;  Service: Cardiovascular;;     Current Facility-Administered Medications  Medication Dose Route Frequency Provider Last Rate Last Dose  . 0.9 %  sodium chloride infusion   Intravenous Continuous Jalisia Puchalski, Ocie Doyne, MD        Allergies:   Adhesive [tape]   Social History:  The patient  reports that she quit smoking about 35 years ago. Her smoking use included cigarettes. She started smoking about 39 years ago. She has a 5.00 pack-year smoking history. She has never used smokeless tobacco. She reports that she drinks alcohol. She reports that she does not use drugs.   Family History:  The patient's family history includes Arrhythmia in her  mother; Diabetes in her mother and paternal grandfather; Heart attack (age of onset: 60) in her brother; Heart disease in her paternal aunt; Hypertension in her mother; Prostate cancer in her maternal grandfather; Suicidality in her father.   ROS:  Please see the history of present illness.   Otherwise, review of systems is positive for none.   All other systems are reviewed and negative.   PHYSICAL EXAM: VS:  BP 115/78   Pulse 81   Temp (!) 97.3 F (36.3 C) (Temporal)   Ht 5\' 6"  (1.676 m)   Wt 110.7 kg   SpO2 92%   BMI 39.38 kg/m  , BMI Body mass index is 39.38 kg/m. GEN: Well nourished, well developed, in no acute distress  HEENT: normal  Neck: no JVD, carotid bruits, or masses Cardiac: iRRR; no murmurs, rubs, or gallops,no edema  Respiratory:  clear to auscultation bilaterally, normal work of breathing GI: soft, nontender, nondistended, + BS MS: no deformity or atrophy  Skin: warm and dry Neuro:  Strength and sensation are intact Psych: euthymic mood, full affect  Recent Labs: 01/24/2018: BUN 18; Creatinine, Ser 1.07; Hemoglobin 14.6; Platelets 276; Potassium 4.1; Sodium 141    Lipid Panel     Component Value Date/Time   CHOL 116 10/16/2016 0850   TRIG 139 10/16/2016 0850   HDL 42 10/16/2016 0850   CHOLHDL 2.8 10/16/2016 0850   CHOLHDL 4.5 03/31/2016 0248   VLDL 29 03/31/2016 0248   LDLCALC 46 10/16/2016 0850     Wt Readings from Last 3 Encounters:  02/07/18 110.7 kg  01/01/18 110.5 kg  12/10/17 108.7 kg      Other studies Reviewed: Additional studies/ records that were reviewed today include: TTE 03/28/16  Review of the above records today demonstrates:  - Left ventricle: The cavity size was normal. Wall thickness was   increased in a pattern of mild LVH. Systolic function was normal.   The estimated ejection fraction was in the range of 55% to 60%.   Indeterminant diastolic function (atrial fibrillation). Wall   motion was normal; there were no regional  wall motion   abnormalities. - Aortic valve: There was no stenosis. - Mitral valve: Mildly calcified annulus. There was no significant   regurgitation. - Left atrium: The atrium was mildly dilated. - Right ventricle: The cavity size was normal. Pacer wire or   catheter noted in right ventricle. Systolic function was normal. - Tricuspid valve: Peak RV-RA gradient (S): 22 mm Hg. - Pulmonary arteries: PA peak pressure: 30 mm Hg (S). - Systemic veins: IVC measured 2.3 cm with normal respirophasic   variation, suggesting RA pressure 8 mmHg.  LHC/RHC 05/09/17 1.  Single-vessel coronary artery disease with continued patency of the stented segment in the right coronary artery and chronic occlusion of a small acute marginal branch collateralized by the left coronary artery 2.  Patent left main, LAD, and left circumflex without significant stenosis 3.  Vigorous LV systolic function with mildly elevated LVEDP 4.  Severe pulmonary hypertension with mean pulmonary artery pressure of 48 mmHg.  Because of high cardiac output (8.5 L/min), the patient's pulmonary vascular resistance is only mildly increased at 3 Wood units.  ASSESSMENT AND PLAN:  1.  Persistent atrial fibrillation: Plan for ablation today  Violetta C Feeny has presented today for surgery, with the diagnosis of atrial fibrillation.  The various methods of treatment have been discussed with the patient and family. After consideration of risks, benefits and other options for treatment, the patient has consented to  Procedure(s): Catheter ablation as a surgical intervention .  Risks include but not limited to bleeding, tamponade, heart block, stroke, damage to surrounding organs, among others. The patient's history has been reviewed, patient examined, no change in status, stable for surgery.  I have reviewed the patient's chart and labs.  Questions were answered to the patient's satisfaction.   This patients CHA2DS2-VASc Score and unadjusted  Ischemic Stroke Rate (% per year) is equal to 3.2 % stroke rate/year from a score of 3  Above score calculated as 1 point each if present [CHF, HTN, DM, Vascular=MI/PAD/Aortic Plaque, Age if 65-74, or Female] Above score calculated as 2 points each if present [Age > 75, or Stroke/TIA/TE]  Signed, Noriel Guthrie Meredith Leeds, MD  02/07/2018 7:09 AM

## 2018-02-08 DIAGNOSIS — E785 Hyperlipidemia, unspecified: Secondary | ICD-10-CM | POA: Diagnosis not present

## 2018-02-08 DIAGNOSIS — I252 Old myocardial infarction: Secondary | ICD-10-CM | POA: Diagnosis not present

## 2018-02-08 DIAGNOSIS — M1990 Unspecified osteoarthritis, unspecified site: Secondary | ICD-10-CM | POA: Diagnosis not present

## 2018-02-08 DIAGNOSIS — I5032 Chronic diastolic (congestive) heart failure: Secondary | ICD-10-CM | POA: Diagnosis not present

## 2018-02-08 DIAGNOSIS — Z87891 Personal history of nicotine dependence: Secondary | ICD-10-CM | POA: Diagnosis not present

## 2018-02-08 DIAGNOSIS — Z955 Presence of coronary angioplasty implant and graft: Secondary | ICD-10-CM | POA: Diagnosis not present

## 2018-02-08 DIAGNOSIS — Z8249 Family history of ischemic heart disease and other diseases of the circulatory system: Secondary | ICD-10-CM | POA: Diagnosis not present

## 2018-02-08 DIAGNOSIS — I481 Persistent atrial fibrillation: Secondary | ICD-10-CM | POA: Diagnosis not present

## 2018-02-08 DIAGNOSIS — G4733 Obstructive sleep apnea (adult) (pediatric): Secondary | ICD-10-CM | POA: Diagnosis not present

## 2018-02-08 DIAGNOSIS — I130 Hypertensive heart and chronic kidney disease with heart failure and stage 1 through stage 4 chronic kidney disease, or unspecified chronic kidney disease: Secondary | ICD-10-CM | POA: Diagnosis not present

## 2018-02-08 DIAGNOSIS — Z833 Family history of diabetes mellitus: Secondary | ICD-10-CM | POA: Diagnosis not present

## 2018-02-08 DIAGNOSIS — N182 Chronic kidney disease, stage 2 (mild): Secondary | ICD-10-CM | POA: Diagnosis not present

## 2018-02-08 MED ORDER — AMIODARONE HCL 400 MG PO TABS: 400.0000 mg | ORAL_TABLET | Freq: Two times a day (BID) | ORAL | 1 refills | Status: DC

## 2018-02-08 NOTE — Anesthesia Postprocedure Evaluation (Signed)
Anesthesia Post Note  Patient: [name]  Procedure(s) Performed: ATRIAL FIBRILLATION ABLATION (N/A )     Patient location during evaluation: PACU Anesthesia Type: General Level of consciousness: awake and alert Pain management: pain level controlled Vital Signs Assessment: post-procedure vital signs reviewed and stable Respiratory status: spontaneous breathing, nonlabored ventilation, respiratory function stable and patient connected to nasal cannula oxygen Cardiovascular status: blood pressure returned to baseline and stable Postop Assessment: no apparent nausea or vomiting Anesthetic complications: no    Last Vitals:  Vitals:   02/08/18 0642 02/08/18 0820  BP: 100/77 93/60  Pulse: 66   Resp: 18   Temp: 36.8 C   SpO2: 92%     Last Pain:  Vitals:   02/08/18 0822  TempSrc:   PainSc: 0-No pain                 Barnet Glasgow

## 2018-02-08 NOTE — Addendum Note (Signed)
Addendum  created 02/08/18 0845 by Barnet Glasgow, MD   Sign clinical note

## 2018-02-08 NOTE — Progress Notes (Signed)
Discharge instructions reviewed with pt. Pt has no questions at this time. Pt denies any pain and states she is ready for d/c. Groin level 0.

## 2018-02-09 ENCOUNTER — Other Ambulatory Visit: Payer: Self-pay | Admitting: Internal Medicine

## 2018-02-14 ENCOUNTER — Ambulatory Visit (HOSPITAL_COMMUNITY)
Admission: RE | Admit: 2018-02-14 | Discharge: 2018-02-14 | Disposition: A | Discharge status: Home / Self Care | Payer: PPO | Source: Ambulatory Visit | Attending: Nurse Practitioner | Admitting: Nurse Practitioner

## 2018-02-14 ENCOUNTER — Encounter (HOSPITAL_COMMUNITY): Payer: Self-pay | Admitting: Nurse Practitioner

## 2018-02-14 VITALS — BP 136/78 | HR 128 | Ht 66.0 in | Wt 243.0 lb

## 2018-02-14 DIAGNOSIS — Z955 Presence of coronary angioplasty implant and graft: Secondary | ICD-10-CM | POA: Insufficient documentation

## 2018-02-14 DIAGNOSIS — Z85828 Personal history of other malignant neoplasm of skin: Secondary | ICD-10-CM | POA: Insufficient documentation

## 2018-02-14 DIAGNOSIS — I2510 Atherosclerotic heart disease of native coronary artery without angina pectoris: Secondary | ICD-10-CM | POA: Insufficient documentation

## 2018-02-14 DIAGNOSIS — Z9841 Cataract extraction status, right eye: Secondary | ICD-10-CM | POA: Insufficient documentation

## 2018-02-14 DIAGNOSIS — Z87891 Personal history of nicotine dependence: Secondary | ICD-10-CM | POA: Insufficient documentation

## 2018-02-14 DIAGNOSIS — Z79899 Other long term (current) drug therapy: Secondary | ICD-10-CM | POA: Diagnosis not present

## 2018-02-14 DIAGNOSIS — N189 Chronic kidney disease, unspecified: Secondary | ICD-10-CM | POA: Diagnosis not present

## 2018-02-14 DIAGNOSIS — Z7901 Long term (current) use of anticoagulants: Secondary | ICD-10-CM | POA: Diagnosis not present

## 2018-02-14 DIAGNOSIS — E785 Hyperlipidemia, unspecified: Secondary | ICD-10-CM | POA: Insufficient documentation

## 2018-02-14 DIAGNOSIS — Z888 Allergy status to other drugs, medicaments and biological substances status: Secondary | ICD-10-CM | POA: Insufficient documentation

## 2018-02-14 DIAGNOSIS — Z9889 Other specified postprocedural states: Secondary | ICD-10-CM | POA: Insufficient documentation

## 2018-02-14 DIAGNOSIS — I509 Heart failure, unspecified: Secondary | ICD-10-CM | POA: Insufficient documentation

## 2018-02-14 DIAGNOSIS — I130 Hypertensive heart and chronic kidney disease with heart failure and stage 1 through stage 4 chronic kidney disease, or unspecified chronic kidney disease: Secondary | ICD-10-CM | POA: Insufficient documentation

## 2018-02-14 DIAGNOSIS — G4733 Obstructive sleep apnea (adult) (pediatric): Secondary | ICD-10-CM | POA: Insufficient documentation

## 2018-02-14 DIAGNOSIS — I4892 Unspecified atrial flutter: Secondary | ICD-10-CM | POA: Diagnosis not present

## 2018-02-14 DIAGNOSIS — I272 Pulmonary hypertension, unspecified: Secondary | ICD-10-CM | POA: Diagnosis not present

## 2018-02-14 DIAGNOSIS — Z96652 Presence of left artificial knee joint: Secondary | ICD-10-CM | POA: Diagnosis not present

## 2018-02-14 DIAGNOSIS — I4891 Unspecified atrial fibrillation: Secondary | ICD-10-CM | POA: Diagnosis not present

## 2018-02-14 DIAGNOSIS — Z950 Presence of cardiac pacemaker: Secondary | ICD-10-CM | POA: Diagnosis not present

## 2018-02-14 DIAGNOSIS — Z90710 Acquired absence of both cervix and uterus: Secondary | ICD-10-CM | POA: Insufficient documentation

## 2018-02-14 DIAGNOSIS — Z9842 Cataract extraction status, left eye: Secondary | ICD-10-CM | POA: Insufficient documentation

## 2018-02-14 LAB — BASIC METABOLIC PANEL
Anion gap: 11 (ref 5–15)
BUN: 24 mg/dL — ABNORMAL HIGH (ref 8–23)
CO2: 27 mmol/L (ref 22–32)
Calcium: 9.3 mg/dL (ref 8.9–10.3)
Chloride: 100 mmol/L (ref 98–111)
Creatinine, Ser: 1.22 mg/dL — ABNORMAL HIGH (ref 0.44–1.00)
GFR calc Af Amer: 50 mL/min — ABNORMAL LOW (ref >60)
GFR calc non Af Amer: 43 mL/min — ABNORMAL LOW (ref >60)
Glucose, Bld: 107 mg/dL — ABNORMAL HIGH (ref 70–99)
Potassium: 4.2 mmol/L (ref 3.5–5.1)
Sodium: 138 mmol/L (ref 135–145)

## 2018-02-14 LAB — CBC
HCT: 46.7 % — ABNORMAL HIGH (ref 36.0–46.0)
Hemoglobin: 15.1 g/dL — ABNORMAL HIGH (ref 12.0–15.0)
MCH: 31.9 pg (ref 26.0–34.0)
MCHC: 32.3 g/dL (ref 30.0–36.0)
MCV: 98.5 fL (ref 78.0–100.0)
Platelets: 257 10*3/uL (ref 150–400)
RBC: 4.74 MIL/uL (ref 3.87–5.11)
RDW: 13.3 % (ref 11.5–15.5)
WBC: 8.9 10*3/uL (ref 4.0–10.5)

## 2018-02-14 LAB — TSH: TSH: 1.761 u[IU]/mL (ref 0.350–4.500)

## 2018-02-14 MED ORDER — AMIODARONE HCL 200 MG PO TABS: 200.0000 mg | ORAL_TABLET | Freq: Two times a day (BID) | ORAL | Status: DC

## 2018-02-14 NOTE — Progress Notes (Signed)
Primary Care Physician: [name], MD Referring Physician: Dr. [name] is a 72 y.o. female with a h/o afib ablation 09/29/16 and redo 02/07/2018. She went into rapid atrial flutter later the night of ablation after restarting eliquis. She had not taken  the am before ablation dose, per protocol. The plan was to load her on amiodarone for a week and if still in flutter,  to set up for cardioversion. She is still in atrial flutter at 128 bpm. She is currently on amiodarone 400 mg bid. Weight is stable.  Today, she denies symptoms of palpitations, chest pain, shortness of breath, orthopnea, PND, lower extremity edema, dizziness, presyncope, syncope, or neurologic sequela. The patient is tolerating medications without difficulties and is otherwise without complaint today.   Past Medical History:  Diagnosis Date  . Anticoagulant long-term use    pradaxa  . Anxiety   . Arthritis    "fingers, lower back" (04/23/2017   . CAD (coronary artery disease) 9371,6967   post PTCA with bare-metal stenting to mid RCA in December 2004     . CHF (congestive heart failure) ([place])   . Chronic atrial fibrillation ([place]) 06/2007   Tachybradycardia pacemaker  . Chronic kidney disease    10% function - ?R, other kidney is compensating    . CVA (cerebral vascular accident) [facility]) [id number]   denies residual on 04/23/2017  . Depression   . Diplopia 06/19/2008   Qualifier: Diagnosis of  By: [name] MD, [name]   . Dysrhythmia    ATRIAL FIBRILATION  . Edema of lower extremity   . Hyperlipidemia   . Hypertension   . Inferior myocardial infarction [facility])    acute inferior wall mi/other medical hx  . Myocardial infarction ([place]) [id number]  . OSA on CPAP    last test- 2010  . Pacemaker   . Pneumonia 2014   tx. ----  [facility]  . Pulmonary hypertension ([place])    moderate pulmonary hypertension by 10/2016 echo and 10/2013 cardiac cath  . Shortness of breath   . Skin cancer    "cut off  right [place]; burned off LLE" (04/23/2017)  . Spondylolisthesis   . TIA (transient ischemic attack) 2008  . Unspecified hemorrhoids without mention of complication 11/10/2583   Colonoscopy--Dr. Carlean Purl    Past Surgical History:  Procedure Laterality Date  . APPENDECTOMY  1984  . ATRIAL FIBRILLATION ABLATION N/A 09/29/2016   Procedure: Atrial Fibrillation Ablation;  Surgeon: Will Meredith Leeds, MD;  Location: [place] CV LAB;  Service: Cardiovascular;  Laterality: N/A;  . ATRIAL FIBRILLATION ABLATION N/A 02/07/2018   Procedure: ATRIAL FIBRILLATION ABLATION;  Surgeon: Constance Haw, MD;  Location: [place] CV LAB;  Service: Cardiovascular;  Laterality: N/A;  . BACK SURGERY    . CARDIOVERSION N/A 09/12/2017   Procedure: CARDIOVERSION;  Surgeon: Jerline Pain, MD;  Location: [facility] ENDOSCOPY;  Service: Cardiovascular;  Laterality: N/A;  . CARDIOVERSION N/A 12/13/2017   Procedure: CARDIOVERSION;  Surgeon: Sanda Klein, MD;  Location: [place];  Service: Cardiovascular;  Laterality: N/A;  . CATARACT EXTRACTION W/ INTRAOCULAR LENS  IMPLANT, BILATERAL Bilateral 01/15/2017- 03/2017  . CORONARY ANGIOPLASTY  X 2  . CORONARY ANGIOPLASTY WITH STENT PLACEMENT  1998; ~ 2007; ?date   "1 stent; replaced stent; not sure when I got the last stent" (04/23/2017)  . DOPPLER ECHOCARDIOGRAPHY  2009  . ELECTROPHYSIOLOGIC STUDY N/A 03/31/2016   Procedure: Cardioversion;  Surgeon: Evans Lance, MD;  Location: [place]  CV LAB;  Service: Cardiovascular;  Laterality: N/A;  . ELECTROPHYSIOLOGIC STUDY N/A 08/04/2016   Procedure: Cardioversion;  Surgeon: Evans Lance, MD;  Location: [place] CV LAB;  Service: Cardiovascular;  Laterality: N/A;  . INSERT / REPLACE / REMOVE PACEMAKER  06/2007  . IR RADIOLOGY PERIPHERAL GUIDED IV START  01/31/2018  . IR US GUIDE VASC ACCESS LEFT  01/31/2018  . JOINT REPLACEMENT    . LAPAROSCOPIC CHOLECYSTECTOMY  1994  . LEFT AND RIGHT HEART CATHETERIZATION WITH CORONARY  ANGIOGRAM N/A 10/06/2013   Procedure: LEFT AND RIGHT HEART CATHETERIZATION WITH CORONARY ANGIOGRAM;  Surgeon: Troy Sine, MD;  Location: [facility] CATH LAB;  Service: Cardiovascular;  Laterality: N/A;  . LEFT OOPHORECTOMY Left ~ 1989  . POSTERIOR LUMBAR FUSION  2000s - 04/2015 X 3   L3-4; L4-5; L2-3; Dr Trenton Gammon  . RIGHT/LEFT HEART CATH AND CORONARY ANGIOGRAPHY N/A 05/09/2017   Procedure: RIGHT/LEFT HEART CATH AND CORONARY ANGIOGRAPHY;  Surgeon: Sherren Mocha, MD;  Location: [place] CV LAB;  Service: Cardiovascular;  Laterality: N/A;  . SKIN CANCER EXCISION Right    [place]  . TEE WITHOUT CARDIOVERSION N/A 09/29/2016   Procedure: TRANSESOPHAGEAL ECHOCARDIOGRAM (TEE);  Surgeon: Jerline Pain, MD;  Location: [place];  Service: Cardiovascular;  Laterality: N/A;  . TOTAL ABDOMINAL HYSTERECTOMY  1984   "uterus & right ovary"  . TOTAL KNEE ARTHROPLASTY Left 04/23/2017  . TOTAL KNEE ARTHROPLASTY Left 04/23/2017   Procedure: TOTAL KNEE ARTHROPLASTY;  Surgeon: Frederik Pear, MD;  Location: [place];  Service: Orthopedics;  Laterality: Left;  . ULTRASOUND GUIDANCE FOR VASCULAR ACCESS  05/09/2017   Procedure: Ultrasound Guidance For Vascular Access;  Surgeon: Sherren Mocha, MD;  Location: [place] CV LAB;  Service: Cardiovascular;;    Current Outpatient Medications  Medication Sig Dispense Refill  . acetaminophen (TYLENOL) 500 MG tablet Take 500 mg by mouth every morning.     [name] amiodarone (PACERONE) 200 MG tablet Take 1 tablet (200 mg total) by mouth 2 (two) times daily.    . Ascorbic Acid (VITAMIN C) 1000 MG tablet Take 1,000 mg by mouth daily.    [name] atorvastatin (LIPITOR) 40 MG tablet TAKE 1 TABLET BY MOUTH EVERY DAY 90 tablet 3  . Calcium Carbonate-Vitamin D (CALCIUM 600+D PO) Take 1 tablet by mouth daily.    . Cyanocobalamin (B-12) 5000 MCG CAPS Take 5,000 mcg by mouth daily.     [name] diltiazem (CARDIZEM CD) 120 MG 24 hr capsule TAKE 1 CAPSULE (120 MG TOTAL) BY MOUTH DAILY. 30 capsule 11  .  diphenhydramine-acetaminophen (TYLENOL PM) 25-500 MG TABS tablet Take 1 tablet by mouth at bedtime.    [name] ELIQUIS 5 MG TABS tablet TAKE 1 TABLET BY MOUTH TWICE A DAY 60 tablet 5  . FLUoxetine (PROZAC) 20 MG capsule Take 1 capsule (20 mg total) by mouth every morning. 90 capsule 0  . fluticasone (FLONASE) 50 MCG/ACT nasal spray Place 1 spray into both nostrils at bedtime as needed for allergies.     . furosemide (LASIX) 40 MG tablet Take 40 mg by mouth 2 (two) times daily.    [name] KLOR-CON M20 20 MEQ tablet TAKE 1 TABLET BY MOUTH TWICE A DAY (Patient taking differently: TAKE 20 MEQ BY MOUTH TWICE A DAY) 180 tablet 3  . loratadine (CLARITIN) 10 MG tablet TAKE 1 TABLET BY MOUTH EVERY DAY 30 tablet 5  . Lysine 1000 MG TABS Take 1,000 mg by mouth daily.    . Multiple Vitamin (MULTIVITAMIN WITH MINERALS)  TABS tablet Take 1 tablet by mouth daily. Centrum    . nitroGLYCERIN (NITROSTAT) 0.4 MG SL tablet Place 1 tablet (0.4 mg total) under the tongue every 5 (five) minutes x 3 doses as needed for chest pain. 25 tablet 6  . Polyethyl Glycol-Propyl Glycol (SYSTANE) 0.4-0.3 % SOLN Place 1 drop into both eyes at bedtime as needed (for dry eyes).     . valACYclovir (VALTREX) 1000 MG tablet TAKE 2 TABLETS BY MOUTH AND REPEAT IN 12 HOURS FOR COLD SORES (Patient taking differently: Take 1,000 mg by mouth every 12 (twelve) hours as needed (cold sores). ) 30 tablet 1  . vitamin E 400 UNIT capsule Take 800 Units by mouth daily.      No current facility-administered medications for this encounter.     Allergies  Allergen Reactions  . Adhesive [Tape] Rash and Other (See Comments)    Allergic to EKG stickers and defibrillation pads.    Social History   Socioeconomic History  . Marital status: Married    Spouse name: Not on file  . Number of children: 0  . Years of education: HS  . Highest education level: Not on file  Occupational History  . Occupation: retired    Comment: previously worked [employer]  . Financial resource strain: Not hard at all  . Food insecurity:    Worry: Never true    Inability: Never true  . Transportation needs:    Medical: No    Non-medical: No  Tobacco Use  . Smoking status: Former Smoker    Packs/day: 1.00    Years: 5.00    Pack years: 5.00    Types: Cigarettes    Start date: 05/11/1978    Last attempt to quit: 07/03/1982    Years since quitting: 35.6  . Smokeless tobacco: Never Used  Substance and Sexual Activity  . Alcohol use: Yes    Comment: 04/23/2017 "once q 6 months; glass of wine"  . Drug use: No  . Sexual activity: Not Currently  Lifestyle  . Physical activity:    Days per week: 0 days    Minutes per session: 0 min  . Stress: Rather much  Relationships  . Social connections:    Talks on phone: More than three times a week    Gets together: More than three times a week    Attends religious service: More than 4 times per year    Active member of club or organization: Yes    Attends meetings of clubs or organizations: More than 4 times per year    Relationship status: Married  . Intimate partner violence:    Fear of current or ex partner: No    Emotionally abused: No    Physically abused: No    Forced sexual activity: No  Other Topics Concern  . 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   Married    Originally from [place] of two, high school education   Former smoker [phone] 1ppd   Hunting dogs 7    Retired from [employer] 2     Family History  Problem Relation Age of Onset  . Suicidality Father        suicide death pt was 3 yrs  . Arrhythmia Mother   . Hypertension Mother   . Diabetes Mother   . Heart attack Brother  66  . Heart disease Paternal Aunt   . Prostate cancer Maternal Grandfather   . Diabetes Paternal Grandfather        fathers side of the family    ROS- All systems are reviewed and negative  except as per the HPI above  Physical Exam: Vitals:   02/14/18 1125  BP: 136/78  Pulse: (!) 128  SpO2: 94%  Weight: 110.2 kg  Height: 5\' 6"  (1.676 m)   Wt Readings from Last 3 Encounters:  02/14/18 110.2 kg  02/08/18 112.4 kg  01/01/18 110.5 kg    Labs: Lab Results  Component Value Date   NA 138 02/14/2018   K 4.2 02/14/2018   CL 100 02/14/2018   CO2 27 02/14/2018   GLUCOSE 107 (H) 02/14/2018   BUN 24 (H) 02/14/2018   CREATININE 1.22 (H) 02/14/2018   CALCIUM 9.3 02/14/2018   MG 2.1 11/13/2016   Lab Results  Component Value Date   INR 1.0 05/08/2017   Lab Results  Component Value Date   CHOL 116 10/16/2016   HDL 42 10/16/2016   LDLCALC 46 10/16/2016   TRIG 139 10/16/2016     GEN- The patient is well appearing, alert and oriented x 3 today.   Head- normocephalic, atraumatic Eyes-  Sclera clear, conjunctiva pink Ears- hearing intact Oropharynx- clear Neck- supple, no JVP Lymph- no cervical lymphadenopathy Lungs- Clear to ausculation bilaterally, normal work of breathing Heart- Rapid irregular rate and rhythm, no murmurs, rubs or gallops, PMI not laterally displaced GI- soft, NT, ND, + BS Extremities- no clubbing, cyanosis, or edema MS- no significant deformity or atrophy Skin- no rash or lesion Psych- euthymic mood, full affect Neuro- strength and sensation are intact  EKG- Atrial flutter at 128 ms, qrs int 86 ms, qtc 481 ms Epic records reviewed    Assessment and Plan: 1. Afib S/p afib ablation 8/8 with onset rapid atrial flutter Has been loading on amiodarone 400 mg bid which will be a week tomorrow She will lower the dose of 200 mg bid on Saturday She has a cardioversion scheduled Monday  8/19  TEE will not be necessary, as reviewed with Dr. Curt Bears, since she had restarted her eliquis in the pm after ablation before the onset of atrial flutter Continue eliquis 5 mg bid for chadsvasc score of at least 6  F/u  in the afib in one week  Butch Penny C.  Talyn Dessert, [facility] [address] [place], [place] [address] [phone]

## 2018-02-14 NOTE — Patient Instructions (Signed)
Cardioversion scheduled for Monday, August 19th  - Arrive at the [facility] and go to admitting at [facility] not eat or drink anything after midnight the night prior to your procedure.  - Take all your morning medication with a sip of water prior to arrival.  - You will not be able to drive home after your procedure.   Decrease amiodarone to 200mg  twice a day on Saturday, August 17th

## 2018-02-14 NOTE — H&P (View-Only) (Signed)
Primary Care Physician: Samantha Medin, MD Referring Physician: Dr. [name] is a 72 y.o. female with a h/o afib ablation 09/29/16 and redo 02/07/2018. She went into rapid atrial flutter later the night of ablation after restarting eliquis. She had not taken  the am before ablation dose, per protocol. The plan was to load her on amiodarone for a week and if still in flutter,  to set up for cardioversion. She is still in atrial flutter at 128 bpm. She is currently on amiodarone 400 mg bid. Weight is stable.  Today, she denies symptoms of palpitations, chest pain, shortness of breath, orthopnea, PND, lower extremity edema, dizziness, presyncope, syncope, or neurologic sequela. The patient is tolerating medications without difficulties and is otherwise without complaint today.   Past Medical History:  Diagnosis Date  . Anticoagulant long-term use    pradaxa  . Anxiety   . Arthritis    "fingers, lower back" (04/23/2017   . CAD (coronary artery disease) [id number]   post PTCA with bare-metal stenting to mid RCA in December 2004     . CHF (congestive heart failure) ([place])   . Chronic atrial fibrillation ([place]) 06/2007   Tachybradycardia pacemaker  . Chronic kidney disease    10% function - ?R, other kidney is compensating    . CVA (cerebral vascular accident) [facility]) [phone]   denies residual on 04/23/2017  . Depression   . Diplopia 06/19/2008   Qualifier: Diagnosis of  By: [name] MD, [name]   . Dysrhythmia    ATRIAL FIBRILATION  . Edema of lower extremity   . Hyperlipidemia   . Hypertension   . Inferior myocardial infarction [facility])    acute inferior wall mi/other medical hx  . Myocardial infarction ([place]) [id number]  . OSA on CPAP    last test- 2010  . Pacemaker   . Pneumonia 2014   tx. ----  [facility]  . Pulmonary hypertension ([place])    moderate pulmonary hypertension by 10/2016 echo and 10/2013 cardiac cath  . Shortness of breath   . Skin cancer    "cut off  right [place]; burned off LLE" (04/23/2017)  . Spondylolisthesis   . TIA (transient ischemic attack) 2008  . Unspecified hemorrhoids without mention of complication 0/12/6224   Colonoscopy--Dr. Carlean Purl    Past Surgical History:  Procedure Laterality Date  . APPENDECTOMY  1984  . ATRIAL FIBRILLATION ABLATION N/A 09/29/2016   Procedure: Atrial Fibrillation Ablation;  Surgeon: Will Meredith Leeds, MD;  Location: [place] CV LAB;  Service: Cardiovascular;  Laterality: N/A;  . ATRIAL FIBRILLATION ABLATION N/A 02/07/2018   Procedure: ATRIAL FIBRILLATION ABLATION;  Surgeon: Constance Haw, MD;  Location: [place] CV LAB;  Service: Cardiovascular;  Laterality: N/A;  . BACK SURGERY    . CARDIOVERSION N/A 09/12/2017   Procedure: CARDIOVERSION;  Surgeon: Jerline Pain, MD;  Location: [facility] ENDOSCOPY;  Service: Cardiovascular;  Laterality: N/A;  . CARDIOVERSION N/A 12/13/2017   Procedure: CARDIOVERSION;  Surgeon: Sanda Klein, MD;  Location: [place];  Service: Cardiovascular;  Laterality: N/A;  . CATARACT EXTRACTION W/ INTRAOCULAR LENS  IMPLANT, BILATERAL Bilateral 01/15/2017- 03/2017  . CORONARY ANGIOPLASTY  X 2  . CORONARY ANGIOPLASTY WITH STENT PLACEMENT  1998; ~ 2007; ?date   "1 stent; replaced stent; not sure when I got the last stent" (04/23/2017)  . DOPPLER ECHOCARDIOGRAPHY  2009  . ELECTROPHYSIOLOGIC STUDY N/A 03/31/2016   Procedure: Cardioversion;  Surgeon: Evans Lance, MD;  Location: [place]  CV LAB;  Service: Cardiovascular;  Laterality: N/A;  . ELECTROPHYSIOLOGIC STUDY N/A 08/04/2016   Procedure: Cardioversion;  Surgeon: Evans Lance, MD;  Location: [place] CV LAB;  Service: Cardiovascular;  Laterality: N/A;  . INSERT / REPLACE / REMOVE PACEMAKER  06/2007  . IR RADIOLOGY PERIPHERAL GUIDED IV START  01/31/2018  . IR US GUIDE VASC ACCESS LEFT  01/31/2018  . JOINT REPLACEMENT    . LAPAROSCOPIC CHOLECYSTECTOMY  1994  . LEFT AND RIGHT HEART CATHETERIZATION WITH CORONARY  ANGIOGRAM N/A 10/06/2013   Procedure: LEFT AND RIGHT HEART CATHETERIZATION WITH CORONARY ANGIOGRAM;  Surgeon: Troy Sine, MD;  Location: [facility] CATH LAB;  Service: Cardiovascular;  Laterality: N/A;  . LEFT OOPHORECTOMY Left ~ 1989  . POSTERIOR LUMBAR FUSION  2000s - 04/2015 X 3   L3-4; L4-5; L2-3; Dr Trenton Gammon  . RIGHT/LEFT HEART CATH AND CORONARY ANGIOGRAPHY N/A 05/09/2017   Procedure: RIGHT/LEFT HEART CATH AND CORONARY ANGIOGRAPHY;  Surgeon: Sherren Mocha, MD;  Location: [place] CV LAB;  Service: Cardiovascular;  Laterality: N/A;  . SKIN CANCER EXCISION Right    [place]  . TEE WITHOUT CARDIOVERSION N/A 09/29/2016   Procedure: TRANSESOPHAGEAL ECHOCARDIOGRAM (TEE);  Surgeon: Jerline Pain, MD;  Location: [place];  Service: Cardiovascular;  Laterality: N/A;  . TOTAL ABDOMINAL HYSTERECTOMY  1984   "uterus & right ovary"  . TOTAL KNEE ARTHROPLASTY Left 04/23/2017  . TOTAL KNEE ARTHROPLASTY Left 04/23/2017   Procedure: TOTAL KNEE ARTHROPLASTY;  Surgeon: Frederik Pear, MD;  Location: [place];  Service: Orthopedics;  Laterality: Left;  . ULTRASOUND GUIDANCE FOR VASCULAR ACCESS  05/09/2017   Procedure: Ultrasound Guidance For Vascular Access;  Surgeon: Sherren Mocha, MD;  Location: [place] CV LAB;  Service: Cardiovascular;;    Current Outpatient Medications  Medication Sig Dispense Refill  . acetaminophen (TYLENOL) 500 MG tablet Take 500 mg by mouth every morning.     [name] amiodarone (PACERONE) 200 MG tablet Take 1 tablet (200 mg total) by mouth 2 (two) times daily.    . Ascorbic Acid (VITAMIN C) 1000 MG tablet Take 1,000 mg by mouth daily.    [name] atorvastatin (LIPITOR) 40 MG tablet TAKE 1 TABLET BY MOUTH EVERY DAY 90 tablet 3  . Calcium Carbonate-Vitamin D (CALCIUM 600+D PO) Take 1 tablet by mouth daily.    . Cyanocobalamin (B-12) 5000 MCG CAPS Take 5,000 mcg by mouth daily.     [name] diltiazem (CARDIZEM CD) 120 MG 24 hr capsule TAKE 1 CAPSULE (120 MG TOTAL) BY MOUTH DAILY. 30 capsule 11  .  diphenhydramine-acetaminophen (TYLENOL PM) 25-500 MG TABS tablet Take 1 tablet by mouth at bedtime.    [name] ELIQUIS 5 MG TABS tablet TAKE 1 TABLET BY MOUTH TWICE A DAY 60 tablet 5  . FLUoxetine (PROZAC) 20 MG capsule Take 1 capsule (20 mg total) by mouth every morning. 90 capsule 0  . fluticasone (FLONASE) 50 MCG/ACT nasal spray Place 1 spray into both nostrils at bedtime as needed for allergies.     . furosemide (LASIX) 40 MG tablet Take 40 mg by mouth 2 (two) times daily.    [name] KLOR-CON M20 20 MEQ tablet TAKE 1 TABLET BY MOUTH TWICE A DAY (Patient taking differently: TAKE 20 MEQ BY MOUTH TWICE A DAY) 180 tablet 3  . loratadine (CLARITIN) 10 MG tablet TAKE 1 TABLET BY MOUTH EVERY DAY 30 tablet 5  . Lysine 1000 MG TABS Take 1,000 mg by mouth daily.    . Multiple Vitamin (MULTIVITAMIN WITH MINERALS)  TABS tablet Take 1 tablet by mouth daily. Centrum    . nitroGLYCERIN (NITROSTAT) 0.4 MG SL tablet Place 1 tablet (0.4 mg total) under the tongue every 5 (five) minutes x 3 doses as needed for chest pain. 25 tablet 6  . Polyethyl Glycol-Propyl Glycol (SYSTANE) 0.4-0.3 % SOLN Place 1 drop into both eyes at bedtime as needed (for dry eyes).     . valACYclovir (VALTREX) 1000 MG tablet TAKE 2 TABLETS BY MOUTH AND REPEAT IN 12 HOURS FOR COLD SORES (Patient taking differently: Take 1,000 mg by mouth every 12 (twelve) hours as needed (cold sores). ) 30 tablet 1  . vitamin E 400 UNIT capsule Take 800 Units by mouth daily.      No current facility-administered medications for this encounter.     Allergies  Allergen Reactions  . Adhesive [Tape] Rash and Other (See Comments)    Allergic to EKG stickers and defibrillation pads.    Social History   Socioeconomic History  . Marital status: Married    Spouse name: Not on file  . Number of children: 0  . Years of education: HS  . Highest education level: Not on file  Occupational History  . Occupation: retired    Comment: previously worked [employer]  . Financial resource strain: Not hard at all  . Food insecurity:    Worry: Never true    Inability: Never true  . Transportation needs:    Medical: No    Non-medical: No  Tobacco Use  . Smoking status: Former Smoker    Packs/day: 1.00    Years: 5.00    Pack years: 5.00    Types: Cigarettes    Start date: 05/11/1978    Last attempt to quit: 07/03/1982    Years since quitting: 35.6  . Smokeless tobacco: Never Used  Substance and Sexual Activity  . Alcohol use: Yes    Comment: 04/23/2017 "once q 6 months; glass of wine"  . Drug use: No  . Sexual activity: Not Currently  Lifestyle  . Physical activity:    Days per week: 0 days    Minutes per session: 0 min  . Stress: Rather much  Relationships  . Social connections:    Talks on phone: More than three times a week    Gets together: More than three times a week    Attends religious service: More than 4 times per year    Active member of club or organization: Yes    Attends meetings of clubs or organizations: More than 4 times per year    Relationship status: Married  . Intimate partner violence:    Fear of current or ex partner: No    Emotionally abused: No    Physically abused: No    Forced sexual activity: No  Other Topics Concern  . 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   Married    Originally from [place] [place] of two, high school education   Former smoker [phone] 1ppd   Hunting dogs 7    Retired from [place] 2     Family History  Problem Relation Age of Onset  . Suicidality Father        suicide death pt was 3 yrs  . Arrhythmia Mother   . Hypertension Mother   . Diabetes Mother   . Heart attack Brother  92  . Heart disease Paternal Aunt   . Prostate cancer Maternal Grandfather   . Diabetes Paternal Grandfather        fathers side of the family    ROS- All systems are reviewed and negative  except as per the HPI above  Physical Exam: Vitals:   02/14/18 1125  BP: 136/78  Pulse: (!) 128  SpO2: 94%  Weight: 110.2 kg  Height: 5\' 6"  (1.676 m)   Wt Readings from Last 3 Encounters:  02/14/18 110.2 kg  02/08/18 112.4 kg  01/01/18 110.5 kg    Labs: Lab Results  Component Value Date   NA 138 02/14/2018   K 4.2 02/14/2018   CL 100 02/14/2018   CO2 27 02/14/2018   GLUCOSE 107 (H) 02/14/2018   BUN 24 (H) 02/14/2018   CREATININE 1.22 (H) 02/14/2018   CALCIUM 9.3 02/14/2018   MG 2.1 11/13/2016   Lab Results  Component Value Date   INR 1.0 05/08/2017   Lab Results  Component Value Date   CHOL 116 10/16/2016   HDL 42 10/16/2016   LDLCALC 46 10/16/2016   TRIG 139 10/16/2016     GEN- The patient is well appearing, alert and oriented x 3 today.   Head- normocephalic, atraumatic Eyes-  Sclera clear, conjunctiva pink Ears- hearing intact Oropharynx- clear Neck- supple, no JVP Lymph- no cervical lymphadenopathy Lungs- Clear to ausculation bilaterally, normal work of breathing Heart- Rapid irregular rate and rhythm, no murmurs, rubs or gallops, PMI not laterally displaced GI- soft, NT, ND, + BS Extremities- no clubbing, cyanosis, or edema MS- no significant deformity or atrophy Skin- no rash or lesion Psych- euthymic mood, full affect Neuro- strength and sensation are intact  EKG- Atrial flutter at 128 ms, qrs int 86 ms, qtc 481 ms Epic records reviewed    Assessment and Plan: 1. Afib S/p afib ablation 8/8 with onset rapid atrial flutter Has been loading on amiodarone 400 mg bid which will be a week tomorrow She will lower the dose of 200 mg bid on Saturday She has a cardioversion scheduled Monday  8/19  TEE will not be necessary, as reviewed with Dr. Curt Bears, since she had restarted her eliquis in the pm after ablation before the onset of atrial flutter Continue eliquis 5 mg bid for chadsvasc score of at least 6  F/u  in the afib in one week  Butch Penny C.  Georga Stys, [facility] [address] [place], [place] [address] [phone]

## 2018-02-15 ENCOUNTER — Other Ambulatory Visit (HOSPITAL_COMMUNITY): Payer: Self-pay | Admitting: *Deleted

## 2018-02-18 ENCOUNTER — Encounter (HOSPITAL_COMMUNITY)
Admission: RE | Disposition: A | Discharge status: Home / Self Care | Payer: Self-pay | Source: Ambulatory Visit | Attending: Cardiology

## 2018-02-18 ENCOUNTER — Encounter (HOSPITAL_COMMUNITY): Payer: Self-pay

## 2018-02-18 ENCOUNTER — Ambulatory Visit (HOSPITAL_COMMUNITY)
Admission: RE | Admit: 2018-02-18 | Discharge: 2018-02-18 | Disposition: A | Discharge status: Home / Self Care | Payer: PPO | Source: Ambulatory Visit | Attending: Cardiology | Admitting: Cardiology

## 2018-02-18 ENCOUNTER — Ambulatory Visit (HOSPITAL_COMMUNITY): Payer: PPO | Admitting: Anesthesiology

## 2018-02-18 ENCOUNTER — Other Ambulatory Visit: Payer: Self-pay

## 2018-02-18 DIAGNOSIS — I2510 Atherosclerotic heart disease of native coronary artery without angina pectoris: Secondary | ICD-10-CM | POA: Insufficient documentation

## 2018-02-18 DIAGNOSIS — Z90722 Acquired absence of ovaries, bilateral: Secondary | ICD-10-CM | POA: Insufficient documentation

## 2018-02-18 DIAGNOSIS — Z955 Presence of coronary angioplasty implant and graft: Secondary | ICD-10-CM | POA: Diagnosis not present

## 2018-02-18 DIAGNOSIS — Z79899 Other long term (current) drug therapy: Secondary | ICD-10-CM | POA: Diagnosis not present

## 2018-02-18 DIAGNOSIS — I509 Heart failure, unspecified: Secondary | ICD-10-CM | POA: Diagnosis not present

## 2018-02-18 DIAGNOSIS — I481 Persistent atrial fibrillation: Secondary | ICD-10-CM | POA: Diagnosis not present

## 2018-02-18 DIAGNOSIS — Z9842 Cataract extraction status, left eye: Secondary | ICD-10-CM | POA: Diagnosis not present

## 2018-02-18 DIAGNOSIS — I252 Old myocardial infarction: Secondary | ICD-10-CM | POA: Insufficient documentation

## 2018-02-18 DIAGNOSIS — Z9889 Other specified postprocedural states: Secondary | ICD-10-CM | POA: Insufficient documentation

## 2018-02-18 DIAGNOSIS — I130 Hypertensive heart and chronic kidney disease with heart failure and stage 1 through stage 4 chronic kidney disease, or unspecified chronic kidney disease: Secondary | ICD-10-CM | POA: Diagnosis not present

## 2018-02-18 DIAGNOSIS — N189 Chronic kidney disease, unspecified: Secondary | ICD-10-CM | POA: Insufficient documentation

## 2018-02-18 DIAGNOSIS — M1990 Unspecified osteoarthritis, unspecified site: Secondary | ICD-10-CM | POA: Insufficient documentation

## 2018-02-18 DIAGNOSIS — Z9841 Cataract extraction status, right eye: Secondary | ICD-10-CM | POA: Diagnosis not present

## 2018-02-18 DIAGNOSIS — Z981 Arthrodesis status: Secondary | ICD-10-CM | POA: Diagnosis not present

## 2018-02-18 DIAGNOSIS — I272 Pulmonary hypertension, unspecified: Secondary | ICD-10-CM | POA: Insufficient documentation

## 2018-02-18 DIAGNOSIS — G4733 Obstructive sleep apnea (adult) (pediatric): Secondary | ICD-10-CM | POA: Insufficient documentation

## 2018-02-18 DIAGNOSIS — Z8673 Personal history of transient ischemic attack (TIA), and cerebral infarction without residual deficits: Secondary | ICD-10-CM | POA: Diagnosis not present

## 2018-02-18 DIAGNOSIS — I482 Chronic atrial fibrillation: Secondary | ICD-10-CM | POA: Diagnosis not present

## 2018-02-18 DIAGNOSIS — Z87891 Personal history of nicotine dependence: Secondary | ICD-10-CM | POA: Diagnosis not present

## 2018-02-18 DIAGNOSIS — E785 Hyperlipidemia, unspecified: Secondary | ICD-10-CM | POA: Diagnosis not present

## 2018-02-18 DIAGNOSIS — Z7902 Long term (current) use of antithrombotics/antiplatelets: Secondary | ICD-10-CM | POA: Diagnosis not present

## 2018-02-18 DIAGNOSIS — Z85828 Personal history of other malignant neoplasm of skin: Secondary | ICD-10-CM | POA: Insufficient documentation

## 2018-02-18 DIAGNOSIS — Z888 Allergy status to other drugs, medicaments and biological substances status: Secondary | ICD-10-CM | POA: Insufficient documentation

## 2018-02-18 DIAGNOSIS — Z8249 Family history of ischemic heart disease and other diseases of the circulatory system: Secondary | ICD-10-CM | POA: Insufficient documentation

## 2018-02-18 DIAGNOSIS — I4891 Unspecified atrial fibrillation: Secondary | ICD-10-CM | POA: Diagnosis not present

## 2018-02-18 DIAGNOSIS — I4892 Unspecified atrial flutter: Secondary | ICD-10-CM | POA: Diagnosis not present

## 2018-02-18 DIAGNOSIS — Z96652 Presence of left artificial knee joint: Secondary | ICD-10-CM | POA: Insufficient documentation

## 2018-02-18 DIAGNOSIS — I480 Paroxysmal atrial fibrillation: Secondary | ICD-10-CM | POA: Diagnosis not present

## 2018-02-18 DIAGNOSIS — Z818 Family history of other mental and behavioral disorders: Secondary | ICD-10-CM | POA: Insufficient documentation

## 2018-02-18 DIAGNOSIS — Z90710 Acquired absence of both cervix and uterus: Secondary | ICD-10-CM | POA: Diagnosis not present

## 2018-02-18 DIAGNOSIS — Z8042 Family history of malignant neoplasm of prostate: Secondary | ICD-10-CM | POA: Insufficient documentation

## 2018-02-18 HISTORY — PX: CARDIOVERSION: SHX1299

## 2018-02-18 SURGERY — CARDIOVERSION
Anesthesia: General

## 2018-02-18 MED ORDER — PROPOFOL 10 MG/ML IV BOLUS
INTRAVENOUS | Status: DC | PRN
  Administered 2018-02-18: 70 mg via INTRAVENOUS

## 2018-02-18 MED ORDER — SODIUM CHLORIDE 0.9 % IV SOLN
INTRAVENOUS | Status: DC
  Administered 2018-02-18 (×2): via INTRAVENOUS

## 2018-02-18 MED ORDER — LIDOCAINE HCL (CARDIAC) PF 100 MG/5ML IV SOSY
PREFILLED_SYRINGE | INTRAVENOUS | Status: DC | PRN
  Administered 2018-02-18: 50 mg via INTRATRACHEAL

## 2018-02-18 NOTE — Transfer of Care (Signed)
Immediate Anesthesia Transfer of Care Note  Patient: [name]  Procedure(s) Performed: CARDIOVERSION (N/A )  Patient Location: Endoscopy Unit  Anesthesia Type:General  Level of Consciousness: awake, alert , oriented and patient cooperative  Airway & Oxygen Therapy: Patient Spontanous Breathing and Patient connected to nasal cannula oxygen  Post-op Assessment: Report given to RN and Post -op Vital signs reviewed and stable  Post vital signs: Reviewed and stable  Last Vitals:  Vitals Value Taken Time  BP    Temp    Pulse    Resp    SpO2      Last Pain:  Vitals:   02/18/18 0959  TempSrc: Oral  PainSc: 0-No pain         Complications: No apparent anesthesia complications

## 2018-02-18 NOTE — Discharge Instructions (Signed)
Electrical Cardioversion, Care After °This sheet gives you information about how to care for yourself after your procedure. Your health care provider may also give you more specific instructions. If you have problems or questions, contact your health care provider. °What can I expect after the procedure? °After the procedure, it is common to have: °· Some redness on the skin where the shocks were given. ° °Follow these instructions at home: °· Do not drive for 24 hours if you were given a medicine to help you relax (sedative). °· Take over-the-counter and prescription medicines only as told by your health care provider. °· Ask your health care provider how to check your pulse. Check it often. °· Rest for 48 hours after the procedure or as told by your health care provider. °· Avoid or limit your caffeine use as told by your health care provider. °Contact a health care provider if: °· You feel like your heart is beating too quickly or your pulse is not regular. °· You have a serious muscle cramp that does not go away. °Get help right away if: °· You have discomfort in your chest. °· You are dizzy or you feel faint. °· You have trouble breathing or you are short of breath. °· Your speech is slurred. °· You have trouble moving an arm or leg on one side of your body. °· Your fingers or toes turn cold or blue. °This information is not intended to replace advice given to you by your health care provider. Make sure you discuss any questions you have with your health care provider. °Document Released: 04/09/2013 Document Revised: 01/21/2016 Document Reviewed: 12/24/2015 °Elsevier Interactive Patient Education © 2018 Elsevier Inc. ° °

## 2018-02-18 NOTE — Interval H&P Note (Signed)
History and Physical Interval Note:  02/18/2018 10:11 AM  [name]  has presented today for surgery, with the diagnosis of AFIB  The various methods of treatment have been discussed with the patient and family. After consideration of risks, benefits and other options for treatment, the patient has consented to  Procedure(s): CARDIOVERSION (N/A) as a surgical intervention .  The patient's history has been reviewed, patient examined, no change in status, stable for surgery.  I have reviewed the patient's chart and labs.  Questions were answered to the patient's satisfaction.     [name]

## 2018-02-18 NOTE — Anesthesia Preprocedure Evaluation (Addendum)
Anesthesia Evaluation  Patient identified by MRN, date of birth, ID band Patient awake    Reviewed: Allergy & Precautions, NPO status , Patient's Chart, lab work & pertinent test results  Airway Mallampati: II  TM Distance: >3 FB     Dental   Pulmonary shortness of breath, sleep apnea , pneumonia, former smoker,    breath sounds clear to auscultation       Cardiovascular hypertension, + CAD, + Past MI, + Peripheral Vascular Disease and +CHF  + dysrhythmias + pacemaker  Rhythm:Irregular Rate:Normal     Neuro/Psych    GI/Hepatic negative GI ROS, Neg liver ROS,   Endo/Other  diabetes  Renal/GU Renal disease     Musculoskeletal   Abdominal   Peds  Hematology   Anesthesia Other Findings   Reproductive/Obstetrics                            Anesthesia Physical Anesthesia Plan  ASA: III  Anesthesia Plan: General   Post-op Pain Management:    Induction: Intravenous  PONV Risk Score and Plan: Treatment may vary due to age or medical condition  Airway Management Planned: Nasal Cannula and Simple Face Mask  Additional Equipment:   Intra-op Plan:   Post-operative Plan:   Informed Consent: I have reviewed the patients History and Physical, chart, labs and discussed the procedure including the risks, benefits and alternatives for the proposed anesthesia with the patient or authorized representative who has indicated his/her understanding and acceptance.   Dental advisory given  Plan Discussed with: Anesthesiologist and CRNA  Anesthesia Plan Comments:         Anesthesia Quick Evaluation

## 2018-02-18 NOTE — Anesthesia Postprocedure Evaluation (Signed)
Anesthesia Post Note  Patient: [name]  Procedure(s) Performed: CARDIOVERSION (N/A )     Patient location during evaluation: PACU Anesthesia Type: General Level of consciousness: awake Pain management: pain level controlled Vital Signs Assessment: post-procedure vital signs reviewed and stable Respiratory status: spontaneous breathing Cardiovascular status: stable Postop Assessment: no apparent nausea or vomiting    Last Vitals:  Vitals:   02/18/18 1140 02/18/18 1150  BP: (!) 144/75 (!) 146/76  Pulse: 78 76  Resp: (!) 24 20  Temp:    SpO2: 97% 96%    Last Pain:  Vitals:   02/18/18 1150  TempSrc:   PainSc: 0-No pain                 Enaya Howze

## 2018-02-18 NOTE — CV Procedure (Signed)
    Cardioversion Note  LARINE FIELDING [id number] Nov 14, 1945  Procedure: DC Cardioversion Indications: atrial fibrillation  Procedure Details Consent: Obtained Time Out: Verified patient identification, verified procedure, site/side was marked, verified correct patient position, special equipment/implants available, Radiology Safety Procedures followed,  medications/allergies/relevent history reviewed, required imaging and test results available.  Performed  The patient has been on adequate anticoagulation.  The patient received IV propofol administered by anesthesia staff for sedation.  Synchronous cardioversion was performed at 150 joules.  The cardioversion was successful, the PM was interrogated and the PM tech confirmed AV pacing.   Complications: No apparent complications Patient did tolerate procedure well.   [name], MD, [facility] 02/18/2018, 11:57 AM

## 2018-02-18 NOTE — Anesthesia Postprocedure Evaluation (Signed)
Anesthesia Post Note  Patient: [name]  Procedure(s) Performed: CARDIOVERSION (N/A )     Patient location during evaluation: Short Stay Anesthesia Type: General Level of consciousness: awake and alert, oriented and patient cooperative Pain management: pain level controlled Vital Signs Assessment: post-procedure vital signs reviewed and stable Respiratory status: spontaneous breathing and respiratory function stable Cardiovascular status: blood pressure returned to baseline and stable Postop Assessment: no headache, adequate PO intake, no backache and no apparent nausea or vomiting Anesthetic complications: no    Last Vitals:  Vitals:   02/18/18 0959  BP: (!) 118/92  Pulse: 97  Resp: (!) 25  Temp: 36.9 C  SpO2: 96%    Last Pain:  Vitals:   02/18/18 0959  TempSrc: Oral  PainSc: 0-No pain                 Rheta Hemmelgarn

## 2018-02-21 ENCOUNTER — Ambulatory Visit: Payer: Self-pay | Admitting: Pharmacist

## 2018-02-21 ENCOUNTER — Other Ambulatory Visit: Payer: Self-pay | Admitting: Pharmacist

## 2018-02-21 NOTE — Patient Outreach (Signed)
[facility] [facility]) Care Management  02/21/2018  ALEIYAH HALPIN [birth year] [id number]   Unsuccessful outreach attempt #1 to Ms. Sproles regarding medicatoin reconciliation post discharge.  HIPAA compliant voicemail was left on her home phone encouraging call back.    PLAN: -I will follow up with patient within 3-4 business days unless call returned sooner   Regina Eck, PharmD, [place]  [phone]

## 2018-02-22 ENCOUNTER — Other Ambulatory Visit: Payer: Self-pay | Admitting: Pharmacist

## 2018-02-22 NOTE — Patient Outreach (Signed)
[place] [facility]) Care Management  02/22/2018  [name] September 23, 1945 [id number]  Incoming call from [name] with HIPAA identifiers verified. 72 year old female requiring medication reconciliation post discharge (30 day post discharge). PMH significant for, but not limited to: Afib (CHA2DS2Vasc is 7, on Eliquis), CAD, Chronic CHF (diastolic), HTN, DMT2, HLD, osteoarthritis.  SUBJECTIVE: Successful call placed to [name] todayto perform medication reconciliation post discharge (30-day post discharge) with HIPAA identifiers verified x3. [name] states she is doing great s/p cardioversion and ablation.  She states her heart is "feeling good in rhythm".  She reports her amiodarone has been decreased to 200 mg twice daily and is pleased.  She states there were no other changes.  Patient uses [facility] on [address] as her pharmacy.  OBJECTIVE: Medications Reviewed Today    Reviewed by Lavera Guise, [facility] (Pharmacist) on 02/22/18 at 1334  Med List Status: <None>  Medication Order Taking? Sig Documenting Provider Last Dose Status Informant  acetaminophen (TYLENOL) 500 MG tablet [id number] Yes Take 500 mg by mouth every morning.  [provider] Taking Active Self  amiodarone (PACERONE) 200 MG tablet [id number] Yes Take 1 tablet (200 mg total) by mouth 2 (two) times daily. Sherran Needs, NP Taking Active   Ascorbic Acid (VITAMIN C) 1000 MG tablet [id number] Yes Take 1,000 mg by mouth daily. [provider] Taking Active Self  atorvastatin (LIPITOR) 40 MG tablet [id number] Yes TAKE 1 TABLET BY MOUTH EVERY DAY Nahser, Wonda Cheng, MD Taking Active Self  Calcium Carbonate-Vitamin D (CALCIUM 600+D PO) [id number] Yes Take 1 tablet by mouth daily. [provider] Taking Active Self  Cyanocobalamin (B-12) 5000 MCG CAPS [id number] Yes Take 5,000 mcg by mouth daily.  [provider] Taking Active Self  diltiazem (CARDIZEM CD) 120 MG 24 hr capsule [id number] Yes  TAKE 1 CAPSULE (120 MG TOTAL) BY MOUTH DAILY. Nahser, Wonda Cheng, MD Taking Active Self  diphenhydramine-acetaminophen (TYLENOL PM) 25-500 MG TABS tablet [id number] Yes Take 1 tablet by mouth at bedtime as needed.  [provider] Taking Active Self  ELIQUIS 5 MG TABS tablet [id number] Yes TAKE 1 TABLET BY MOUTH TWICE A DAY Camnitz, Will Hassell Done, MD Taking Active Self  FLUoxetine (PROZAC) 20 MG capsule [id number] Yes Take 1 capsule (20 mg total) by mouth every morning. [name], [name], MD Taking Active Self  fluticasone (FLONASE) 50 MCG/ACT nasal spray [id number] Yes Place 1 spray into both nostrils at bedtime as needed for allergies.  [provider] Taking Active Self  furosemide (LASIX) 40 MG tablet [id number] Yes Take 40 mg by mouth 2 (two) times daily. [provider] Taking Active   KLOR-CON M20 20 MEQ tablet [id number] Yes TAKE 1 TABLET BY MOUTH TWICE A DAY  Patient taking differently:  TAKE 20 MEQ BY MOUTH TWICE A DAY   Nahser, Wonda Cheng, MD Taking Active Self  loratadine (CLARITIN) 10 MG tablet [id number] Yes TAKE 1 TABLET BY MOUTH EVERY DAY [name], [name], MD Taking Active Self  Lysine 1000 MG TABS [id number] Yes Take 1,000 mg by mouth daily. [provider] Taking Active Self  Multiple Vitamin (MULTIVITAMIN WITH MINERALS) TABS tablet [id number] Yes Take 1 tablet by mouth daily. Centrum [provider] Taking Active Self  nitroGLYCERIN (NITROSTAT) 0.4 MG SL tablet [id number] Yes Place 1 tablet (0.4 mg total) under the tongue every 5 (five) minutes x 3 doses as needed for chest pain. Nahser, Wonda Cheng, MD Taking Active Self  Med Note Amedeo Plenty, EBONY D   Tue Oct 10, 2016  9:31 AM)    Polyethyl Glycol-Propyl Glycol (SYSTANE) 0.4-0.3 % SOLN [id number] Yes Place 1 drop into both eyes at bedtime as needed (for dry eyes).  [provider] Taking Active Self  valACYclovir (VALTREX) 1000 MG tablet [id number] Yes TAKE 2 TABLETS BY MOUTH AND REPEAT IN 12  HOURS FOR COLD SORES  Patient taking differently:  Take 1,000 mg by mouth every 12 (twelve) hours as needed (cold sores).    [name], [name], MD Taking Active   vitamin E 400 UNIT capsule [id number] Yes Take 800 Units by mouth daily.  [provider] Taking Active Self  Med List Note Jaymes Graff 09/10/17 6269): CPAP          ASSESSMENT: Date Discharged from Hospital: 02/08/18 Date Medication Reconciliation Performed: 02/22/2018  Medications Discontinued at Discharge:   Metoprolol succinate 50mg  daily  New Medications at Discharge:   None  No new medications were prescribed at discharge.  Patient was recently discharged from hospital and all medications have been reviewed   Drugs sorted by system:  Neurologic/Psychologic: fluoxetine  Cardiovascular: amiodarone, atorvastatin, furosemide, diltiazem, apixaban, nitroglycerin  Pulmonary/Allergy: loratadine  Topical: Systane eye drops, Flonase  Pain: APAP  Vitamins/Minerals: B12, vit C, calcium/vitD, potassium, lysine, MVI, vitE  Infectious disease: valacyclovir (PRN cold sores)  Medications to avoid in the elderly:  Drug interactions: Diphenhydramine/APAP(Tylenol PM):Per Beers criteria this medication has been identified as potentially inappropriate and should be avoided in patients 65 years and older (independent of diagnosis or condition) due to its potent anticholinergic properties resulting in increased risk of confusion, dry mouth, constipation, and other anticholinergic effects or toxicity; use should also be avoided due to reduced clearance with advanced age and tolerance associated with use as a hypnotic.   Other issues noted:  Amiodarone decreased to 200 mg twice daily.  Patient is aware of this change.  Med rec updated in [facility].   PLAN: -Instructed patient to continue taking mediations as prescribed. -Note routed to PCP   Regina Eck, PharmD, [place]  [phone]

## 2018-02-25 ENCOUNTER — Ambulatory Visit: Payer: Self-pay | Admitting: Pharmacist

## 2018-02-25 ENCOUNTER — Encounter: Payer: Self-pay | Admitting: Cardiovascular Disease

## 2018-02-25 ENCOUNTER — Telehealth: Payer: Self-pay | Admitting: Cardiology

## 2018-02-25 ENCOUNTER — Ambulatory Visit: Payer: PPO | Admitting: Cardiovascular Disease

## 2018-02-25 VITALS — BP 129/80 | HR 71 | Ht 66.0 in | Wt 247.0 lb

## 2018-02-25 DIAGNOSIS — B3789 Other sites of candidiasis: Secondary | ICD-10-CM

## 2018-02-25 DIAGNOSIS — I481 Persistent atrial fibrillation: Secondary | ICD-10-CM

## 2018-02-25 DIAGNOSIS — I4819 Other persistent atrial fibrillation: Secondary | ICD-10-CM

## 2018-02-25 MED ORDER — NYSTATIN 100000 UNIT/GM EX POWD: Freq: Three times a day (TID) | CUTANEOUS | 1 refills | Status: AC

## 2018-02-25 NOTE — Telephone Encounter (Signed)
New message    Patient calling to report swelling in groin area, S/P ablation. Please call

## 2018-02-25 NOTE — Progress Notes (Signed)
[name] Date of Birth  [birth year] [facility] [address]    [name], [place]  [address] [phone]  Fax  [phone]   Problem list: 1. Atrial fibrillation - s/p ablation September 29, 2016 2. Status post pacemaker implantation 3. Coronary artery disease-status post PTCA and stenting of her right coronary artery 4. Hyperlipidemia 5. Hypertension 6. Obstructive sleep apnea-currently on CPAP 7.     [name] is a 72 year old female with a history of atrial fibrillation. She status post pacemaker implantation. She also has a history of coronary artery disease and is status post PTCA and stenting of the right coronary artery. She also has a history of hyperlipidemia, hypertension, and sleep apnea.  She uses a CPAP mask at night.  The CPAP mask has really helped her stay in normal sinus rhythm.  She complains of having some episodes of chest tightness. This occurs at times. One episode was relieved with sublingual nitroglycerin. She states that it is not nearly as severe as her previous episodes of angina.  January 13, 21014: She was hospitalized in November with severe shortness breath and "double pneumonia". She is slowly recovering.  She's looking forward to  getting back into her exercise program.  She has not been exercising at all.  She's had some leg edema.  She denies any chest pain or shortness breath.  She's has mild leg swelling for the past several months. She does not add salt any of her food. She tries to stay away from salty foods and processed meat.  September 18, 2013:  She remains short of breath  - especialy with exertion  Echo in Jan. 2014:   - Left ventricle: The cavity size was normal. Wall thickness was increased in a pattern of mild LVH. Systolic function was normal. The estimated ejection fraction was in the range of 60% to 65%. - Pulmonary arteries: PA peak pressure: 33mm Hg   October 03, 2013:  [name] was seen about 2 weeks ago with some  increasing shortness of breath. She was noted to have moderate pulmonary hypertension on echo. She started on diltiazem. She has fairly well preserved left ventricle systolic function.  We started her on Cardizem XL 180 mg a day. She has not done well since that medication in addition. She's more short of breath. She's gained about 5 pounds.  She presents today for her echocardiogram. She is very short of breath and was worked into the schedule. Her O2 saturations at rest were 86%. We placed  oxygen on her and her O2 saturations increased above 90%. She is clearly more short of breath after the addition of the diltiazem.   She has not been eating any extra salt .  Has been taking her lasix.   She denies any chest pain - specifically was asked about pleuretic CP.  Had orthopnea until we placed the oxygen on her.   10/20/2013: [name] was admitted to the hospital with severe shortness breath when I saw her several weeks ago. She was found to be volume overload. She was aggressively diuresed and lost about 10 pounds of weight. Cardiac catheterization following that diuresis revealed only minimal coronary artery irregularities. Her left ventricular systolic function was normal. Her PA pressures were moderately elevated with an estimated PA pressure of around 57.   She was discharged on the same dose of Lasix that she was on at admission.   She is avoiding salt.    Dec. 3, 2015:  [name] is a  72 yo followed for chronic diastolic CHf and pulmonary hypertension ( est. PA pressure of 70)  [name] has been back in the hospital since I last saw her.  She has  Increased her lasix which has helped.  She does a pretty good job of avoiding salt.   Sept. 27, 2016:  Is doing well.  Has some dyspnea - she thinks likely due to weight gain . No cardiac symptoms .  Needs to have back surgery .   October 06, 2015:  Overall doing well. Had her back surgery in Oct.  Wt. 267 lbs today   Jan. 4, 2018:  [name] is seen  back today following a recent hospitalization in Sept. 2017.   She was complaining of dyspnea , cough. She was cardioverted  She has diastolic dysfunction   She is now back in atrial fib. She new that she had converted back into a-fib.   HR are typically elevated when she is in atrial fib.   Has had some right flank pain .   Was found to have an atrophic right kidney.  Was in the hospital in [place], [place].   Has not tried a higher dose of Tikosyn.    Labs from her recent hospitalization on 06/24/2016 show a potassium level of 4.3. Her magnesium level was 2.0.  October 16, 2016:  [name] is seen today  She has had an A-Fib ablation ( September 29, 2016) since I have seen her She is very shaky today - having trouble getting her breath while walking in today , her lasix was held following her ablation.    She restarted her Lasix 2 weeks ago .   She is feeling generally better since restarting the Lasix   11/21/2016: [name] was recently hospitalized with acute on chronic diastolic congestive heart failure. She had been traveling quite a bit and had been eating lots of salty food. She had also been holding various doses of Lasix set she was traveling. She was diuresed 7 lbs.  Feeling much better   She was supposed to decrease her amio to 200 mg daily but has continued on the 200 mg BID.  Nov. 6, 2018: [name] is seen today for further evaluation of her increasing shortness of breath.  Her amiodarone was stopped several weeks ago. Had left knee replacement 2 weeks ago .   Is doing well.  Is having some chest tightness . Is avoiding salt.   August 06, 2017:  [name] is seen today  Heart catheterization on November 7 revealed a patent stent in the right coronary artery and a chronic total occlusion of a small acute marginal branch.  There was lateral filling of this acute marginal from the left system. She has vigorous left ventricular systolic function with mildly elevated left ventricular end-diastolic  pressure.  She had moderate pulmonary hypertension with PA pressure of 48 mmHg.  The patient had a high cardiac output (8.5 L/min)  The pulmonary vascular resistance is only mildly elevated at 3 Wood units.  Because of her pulmonary hypertension and the discrepancy between the wedge pressure in the left ventricular end-diastolic pressure.  There was some concerning of pulmonary vein stenosis in this patient who has previously had A. fib ablation.  CT angiogram reveal no pulmonary vein stenosis   Doing well Has lost 7 lbs since Nov.   Overall doing well Is taking deeper breaths - is feeling better   Aug. 26, 2019:  Emmali is seen today for work in visit. She  had a repeat A. fib ablation on August 8.  She then had a cardioversion on August 19.  Her leg was slightly tender after the ablation but over the past several days it has developed redness and swelling.  She presents today as a work in visit for the evaluation.  No CP    Current Outpatient Medications on File Prior to Visit  Medication Sig Dispense Refill  . acetaminophen (TYLENOL) 500 MG tablet Take 500 mg by mouth every morning.     [name] amiodarone (PACERONE) 200 MG tablet Take 1 tablet (200 mg total) by mouth 2 (two) times daily.    . Ascorbic Acid (VITAMIN C) 1000 MG tablet Take 1,000 mg by mouth daily.    [name] atorvastatin (LIPITOR) 40 MG tablet TAKE 1 TABLET BY MOUTH EVERY DAY 90 tablet 3  . Calcium Carbonate-Vitamin D (CALCIUM 600+D PO) Take 1 tablet by mouth daily.    . Cyanocobalamin (B-12) 5000 MCG CAPS Take 5,000 mcg by mouth daily.     [name] diltiazem (CARDIZEM CD) 120 MG 24 hr capsule TAKE 1 CAPSULE (120 MG TOTAL) BY MOUTH DAILY. 30 capsule 11  . diphenhydramine-acetaminophen (TYLENOL PM) 25-500 MG TABS tablet Take 1 tablet by mouth at bedtime as needed.     [name] ELIQUIS 5 MG TABS tablet TAKE 1 TABLET BY MOUTH TWICE A DAY 60 tablet 5  . FLUoxetine (PROZAC) 20 MG capsule Take 1 capsule (20 mg total) by mouth every morning. 90 capsule 0   . fluticasone (FLONASE) 50 MCG/ACT nasal spray Place 1 spray into both nostrils at bedtime as needed for allergies.     . furosemide (LASIX) 40 MG tablet Take 40 mg by mouth 2 (two) times daily.    [name] KLOR-CON M20 20 MEQ tablet TAKE 1 TABLET BY MOUTH TWICE A DAY (Patient taking differently: TAKE 20 MEQ BY MOUTH TWICE A DAY) 180 tablet 3  . loratadine (CLARITIN) 10 MG tablet TAKE 1 TABLET BY MOUTH EVERY DAY 30 tablet 5  . Lysine 1000 MG TABS Take 1,000 mg by mouth daily.    . Multiple Vitamin (MULTIVITAMIN WITH MINERALS) TABS tablet Take 1 tablet by mouth daily. Centrum    . nitroGLYCERIN (NITROSTAT) 0.4 MG SL tablet Place 1 tablet (0.4 mg total) under the tongue every 5 (five) minutes x 3 doses as needed for chest pain. 25 tablet 6  . Polyethyl Glycol-Propyl Glycol (SYSTANE) 0.4-0.3 % SOLN Place 1 drop into both eyes at bedtime as needed (for dry eyes).     . valACYclovir (VALTREX) 1000 MG tablet TAKE 2 TABLETS BY MOUTH AND REPEAT IN 12 HOURS FOR COLD SORES (Patient taking differently: Take 1,000 mg by mouth every 12 (twelve) hours as needed (cold sores). ) 30 tablet 1  . vitamin E 400 UNIT capsule Take 800 Units by mouth daily.      No current facility-administered medications on file prior to visit.     Allergies  Allergen Reactions  . Adhesive [Tape] Rash and Other (See Comments)    Allergic to EKG stickers and defibrillation pads.    Past Medical History:  Diagnosis Date  . Anticoagulant long-term use    pradaxa  . Anxiety   . Arthritis    "fingers, lower back" (04/23/2017   . CAD (coronary artery disease) 3976,7341   post PTCA with bare-metal stenting to mid RCA in December 2004     . CHF (congestive heart failure) ([place])   . Chronic atrial fibrillation ([place]) 06/2007   Tachybradycardia  pacemaker  . Chronic kidney disease    10% function - ?R, other kidney is compensating    . CVA (cerebral vascular accident) [facility]) [id number]   denies residual on 04/23/2017  . Depression   .  Diplopia 06/19/2008   Qualifier: Diagnosis of  By: [name] MD, [name]   . Dysrhythmia    ATRIAL FIBRILATION  . Edema of lower extremity   . Hyperlipidemia   . Hypertension   . Inferior myocardial infarction [facility])    acute inferior wall mi/other medical hx  . Myocardial infarction ([place]) [id number]  . OSA on CPAP    last test- 2010  . Pacemaker   . Pneumonia 2014   tx. ----  [facility]  . Pulmonary hypertension ([place])    moderate pulmonary hypertension by 10/2016 echo and 10/2013 cardiac cath  . Shortness of breath   . Skin cancer    "cut off right [place]; burned off LLE" (04/23/2017)  . Spondylolisthesis   . TIA (transient ischemic attack) 2008  . Unspecified hemorrhoids without mention of complication 0/27/2536   Colonoscopy--Dr. Carlean Purl     Past Surgical History:  Procedure Laterality Date  . APPENDECTOMY  1984  . ATRIAL FIBRILLATION ABLATION N/A 09/29/2016   Procedure: Atrial Fibrillation Ablation;  Surgeon: Will Meredith Leeds, MD;  Location: [place] [facility];  Service: Cardiovascular;  Laterality: N/A;  . ATRIAL FIBRILLATION ABLATION N/A 02/07/2018   Procedure: ATRIAL FIBRILLATION ABLATION;  Surgeon: Constance Haw, MD;  Location: [place] [facility];  Service: Cardiovascular;  Laterality: N/A;  . BACK SURGERY    . CARDIOVERSION N/A 09/12/2017   Procedure: CARDIOVERSION;  Surgeon: Jerline Pain, MD;  Location: [facility] ENDOSCOPY;  Service: Cardiovascular;  Laterality: N/A;  . CARDIOVERSION N/A 12/13/2017   Procedure: CARDIOVERSION;  Surgeon: Sanda Klein, MD;  Location: [facility] ENDOSCOPY;  Service: Cardiovascular;  Laterality: N/A;  . CARDIOVERSION N/A 02/18/2018   Procedure: CARDIOVERSION;  Surgeon: Dorothy Spark, MD;  Location: [facility] ENDOSCOPY;  Service: Cardiovascular;  Laterality: N/A;  . CATARACT EXTRACTION W/ INTRAOCULAR LENS  IMPLANT, BILATERAL Bilateral 01/15/2017- 03/2017  . CORONARY ANGIOPLASTY  X 2  . CORONARY ANGIOPLASTY WITH STENT PLACEMENT  1998; ~ 2007; ?date    "1 stent; replaced stent; not sure when I got the last stent" (04/23/2017)  . DOPPLER ECHOCARDIOGRAPHY  2009  . ELECTROPHYSIOLOGIC STUDY N/A 03/31/2016   Procedure: Cardioversion;  Surgeon: Evans Lance, MD;  Location: [place] [facility];  Service: Cardiovascular;  Laterality: N/A;  . ELECTROPHYSIOLOGIC STUDY N/A 08/04/2016   Procedure: Cardioversion;  Surgeon: Evans Lance, MD;  Location: [place] [facility];  Service: Cardiovascular;  Laterality: N/A;  . INSERT / REPLACE / REMOVE PACEMAKER  06/2007  . IR RADIOLOGY PERIPHERAL GUIDED IV START  01/31/2018  . IR US GUIDE VASC ACCESS LEFT  01/31/2018  . JOINT REPLACEMENT    . LAPAROSCOPIC CHOLECYSTECTOMY  1994  . LEFT AND RIGHT HEART CATHETERIZATION WITH CORONARY ANGIOGRAM N/A 10/06/2013   Procedure: LEFT AND RIGHT HEART CATHETERIZATION WITH CORONARY ANGIOGRAM;  Surgeon: Troy Sine, MD;  Location: [facility] CATH LAB;  Service: Cardiovascular;  Laterality: N/A;  . LEFT OOPHORECTOMY Left ~ 1989  . POSTERIOR LUMBAR FUSION  2000s - 04/2015 X 3   L3-4; L4-5; L2-3; Dr Trenton Gammon  . RIGHT/LEFT HEART CATH AND CORONARY ANGIOGRAPHY N/A 05/09/2017   Procedure: RIGHT/LEFT HEART CATH AND CORONARY ANGIOGRAPHY;  Surgeon: Sherren Mocha, MD;  Location: [place] [facility];  Service: Cardiovascular;  Laterality: N/A;  .  SKIN CANCER EXCISION Right    [place]  . TEE WITHOUT CARDIOVERSION N/A 09/29/2016   Procedure: TRANSESOPHAGEAL ECHOCARDIOGRAM (TEE);  Surgeon: Jerline Pain, MD;  Location: [place];  Service: Cardiovascular;  Laterality: N/A;  . TOTAL ABDOMINAL HYSTERECTOMY  1984   "uterus & right ovary"  . TOTAL KNEE ARTHROPLASTY Left 04/23/2017  . TOTAL KNEE ARTHROPLASTY Left 04/23/2017   Procedure: TOTAL KNEE ARTHROPLASTY;  Surgeon: Frederik Pear, MD;  Location: [place];  Service: Orthopedics;  Laterality: Left;  . ULTRASOUND GUIDANCE FOR VASCULAR ACCESS  05/09/2017   Procedure: Ultrasound Guidance For Vascular Access;  Surgeon: Sherren Mocha, MD;  Location: [place] [facility];  Service: Cardiovascular;;    Social History   Tobacco Use  Smoking Status Former Smoker  . Packs/day: 1.00  . Years: 5.00  . Pack years: 5.00  . Types: Cigarettes  . Start date: 05/11/1978  . Last attempt to quit: 07/03/1982  . Years since quitting: 35.6  Smokeless Tobacco Never Used    Social History   Substance and Sexual Activity  Alcohol Use Yes   Comment: 04/23/2017 "once q 6 months; glass of wine"    Family History  Problem Relation Age of Onset  . Suicidality Father        suicide death pt was 3 yrs  . Arrhythmia Mother   . Hypertension Mother   . Diabetes Mother   . Heart attack Brother 54  . Heart disease Paternal Aunt   . Prostate cancer Maternal Grandfather   . Diabetes Paternal Grandfather        fathers side of the family    Reviw of Systems:    Physical Exam: Blood pressure 129/80, pulse 71, height 5\' 6"  (1.676 m), weight 247 lb (112 kg), SpO2 94 %.  GEN:  Well nourished, well developed in no acute distress HEENT: Normal NECK: No JVD; No carotid bruits LYMPHATICS: No lymphadenopathy CARDIAC: RR  RESPIRATORY:  Clear to auscultation without rales, wheezing or rhonchi  ABDOMEN: Soft, non-tender, non-distended MUSCULOSKELETAL:  No edema; No deformity .   Right femoral cath site. - there is a 4 x 4 cm area of bright red,  Superficial rash.   Appears to be c/w candida .  No hematoma, non pulsitile  SKIN: Warm and dry NEUROLOGIC:  Alert and oriented x 3   ECG:    Assessment / Plan:  1.  Cutaneous fungal lesion: Patient appears to have a superficial Candida infection at her cath site.  There is no evidence of hematoma.  There is no evidence of pseudoaneurysm.  We will treat her with nystatin powder 3-4 times a day about a week.  This should heal up fairly quickly.  1. Acute on chronic diastolic CHF:      Doing well   2. Atrial fibrillation -    S/p ablation and cardioversion   2. Status post pacemaker implantation:    3.  Coronary artery disease-   4. Hyperlipidemia -        5. Hypertension -        6. Obstructive sleep apnea-currently on CPAP -   7. Spinal disc disease.       Mertie Moores, MD  02/25/2018 3:11 PM    [place],  [place], [place]  [address] Pager [phone] Phone: [phone]; Fax: [phone]

## 2018-02-25 NOTE — Patient Instructions (Signed)
Medication Instructions:  Your physician has recommended you make the following change in your medication:   START Nystatin powder - use 3-4 times per day as needed for 1 week   Labwork: Your physician recommends that you return for lab work in: 6 months on the day of or a few days before your office visit with Dr. Acie Fredrickson.  You will need to FAST for this appointment - nothing to eat or drink after midnight the night before except water.   Testing/Procedures: None Ordered   Follow-Up: Your physician wants you to follow-up in: 6 months with Dr. Acie Fredrickson. You will receive a reminder letter in the mail two months in advance. If you don't receive a letter, please call our office to schedule the follow-up appointment.   If you need a refill on your cardiac medications before your next appointment, please call your pharmacy.   Thank you for choosing [facility]! Christen Bame, RN [phone]

## 2018-02-25 NOTE — Telephone Encounter (Signed)
Spoke with patient about leg swelling. She had an ablation of 8/8, by Dr. Curt Bears. She recently noticed the site becoming red and swelling. The site is the size of a silver dollar. The patient denies any other symptoms. Spoke with Dr. Acie Fredrickson, he said to add the patient to his DOD spot at 3:20 today. Patient agreed and was scheduled.

## 2018-02-26 ENCOUNTER — Encounter (HOSPITAL_COMMUNITY): Payer: Self-pay | Admitting: Nurse Practitioner

## 2018-02-26 ENCOUNTER — Ambulatory Visit (HOSPITAL_COMMUNITY)
Admission: RE | Admit: 2018-02-26 | Discharge: 2018-02-26 | Disposition: A | Discharge status: Home / Self Care | Payer: PPO | Source: Ambulatory Visit | Attending: Nurse Practitioner | Admitting: Nurse Practitioner

## 2018-02-26 VITALS — BP 116/74 | HR 68 | Ht 66.0 in | Wt 247.0 lb

## 2018-02-26 DIAGNOSIS — Z79899 Other long term (current) drug therapy: Secondary | ICD-10-CM | POA: Diagnosis not present

## 2018-02-26 DIAGNOSIS — Z8249 Family history of ischemic heart disease and other diseases of the circulatory system: Secondary | ICD-10-CM | POA: Insufficient documentation

## 2018-02-26 DIAGNOSIS — I2510 Atherosclerotic heart disease of native coronary artery without angina pectoris: Secondary | ICD-10-CM | POA: Diagnosis not present

## 2018-02-26 DIAGNOSIS — Z888 Allergy status to other drugs, medicaments and biological substances status: Secondary | ICD-10-CM | POA: Diagnosis not present

## 2018-02-26 DIAGNOSIS — F419 Anxiety disorder, unspecified: Secondary | ICD-10-CM | POA: Insufficient documentation

## 2018-02-26 DIAGNOSIS — B379 Candidiasis, unspecified: Secondary | ICD-10-CM | POA: Insufficient documentation

## 2018-02-26 DIAGNOSIS — Z9889 Other specified postprocedural states: Secondary | ICD-10-CM | POA: Insufficient documentation

## 2018-02-26 DIAGNOSIS — G4733 Obstructive sleep apnea (adult) (pediatric): Secondary | ICD-10-CM | POA: Insufficient documentation

## 2018-02-26 DIAGNOSIS — Z950 Presence of cardiac pacemaker: Secondary | ICD-10-CM | POA: Diagnosis not present

## 2018-02-26 DIAGNOSIS — F329 Major depressive disorder, single episode, unspecified: Secondary | ICD-10-CM | POA: Diagnosis not present

## 2018-02-26 DIAGNOSIS — I440 Atrioventricular block, first degree: Secondary | ICD-10-CM | POA: Insufficient documentation

## 2018-02-26 DIAGNOSIS — Z7901 Long term (current) use of anticoagulants: Secondary | ICD-10-CM | POA: Insufficient documentation

## 2018-02-26 DIAGNOSIS — I509 Heart failure, unspecified: Secondary | ICD-10-CM | POA: Insufficient documentation

## 2018-02-26 DIAGNOSIS — I4892 Unspecified atrial flutter: Secondary | ICD-10-CM | POA: Insufficient documentation

## 2018-02-26 DIAGNOSIS — I498 Other specified cardiac arrhythmias: Secondary | ICD-10-CM | POA: Diagnosis not present

## 2018-02-26 DIAGNOSIS — Z87891 Personal history of nicotine dependence: Secondary | ICD-10-CM | POA: Diagnosis not present

## 2018-02-26 DIAGNOSIS — Z833 Family history of diabetes mellitus: Secondary | ICD-10-CM | POA: Diagnosis not present

## 2018-02-26 DIAGNOSIS — I481 Persistent atrial fibrillation: Secondary | ICD-10-CM | POA: Diagnosis not present

## 2018-02-26 DIAGNOSIS — I4819 Other persistent atrial fibrillation: Secondary | ICD-10-CM

## 2018-02-26 DIAGNOSIS — E785 Hyperlipidemia, unspecified: Secondary | ICD-10-CM | POA: Insufficient documentation

## 2018-02-26 DIAGNOSIS — N189 Chronic kidney disease, unspecified: Secondary | ICD-10-CM | POA: Insufficient documentation

## 2018-02-26 DIAGNOSIS — Z8673 Personal history of transient ischemic attack (TIA), and cerebral infarction without residual deficits: Secondary | ICD-10-CM | POA: Insufficient documentation

## 2018-02-26 DIAGNOSIS — I130 Hypertensive heart and chronic kidney disease with heart failure and stage 1 through stage 4 chronic kidney disease, or unspecified chronic kidney disease: Secondary | ICD-10-CM | POA: Insufficient documentation

## 2018-02-26 DIAGNOSIS — Z96652 Presence of left artificial knee joint: Secondary | ICD-10-CM | POA: Insufficient documentation

## 2018-02-26 DIAGNOSIS — I252 Old myocardial infarction: Secondary | ICD-10-CM | POA: Insufficient documentation

## 2018-02-26 DIAGNOSIS — I4891 Unspecified atrial fibrillation: Secondary | ICD-10-CM | POA: Diagnosis present

## 2018-02-26 MED ORDER — AMIODARONE HCL 200 MG PO TABS: 200.0000 mg | ORAL_TABLET | Freq: Every day | ORAL | 3 refills | Status: DC

## 2018-02-26 NOTE — Patient Instructions (Signed)
Amiodarone 200mg once a day    

## 2018-02-26 NOTE — Progress Notes (Signed)
Primary Care Physician: [name], MD Referring Physician: [name] is a 72 y.o. female with a h/o afib ablation 09/29/16 and redo 02/07/2018. She went into rapid atrial flutter later the night of ablation after restarting eliquis. She had not taken  the am before ablation dose, per protocol. The plan was to load her on amiodarone for a week and if still in flutter,  to set up for cardioversion. She is still in atrial flutter at 128 bpm. She is currently on amiodarone 400 mg bid. Weight is stable.  F/u in afib clinic 8/27. She remains in SR after successful cardioversion 8/19. Fluid weight is stable. She feels improved. She had some redness of groin site. She saw Dr. Acie Fredrickson yesterday and was found to have a yeast infection and was given Rx to treat this.   Today, she denies symptoms of palpitations, chest pain, shortness of breath, orthopnea, PND, lower extremity edema, dizziness, presyncope, syncope, or neurologic sequela. The patient is tolerating medications without difficulties and is otherwise without complaint today.   Past Medical History:  Diagnosis Date  . Anticoagulant long-term use    pradaxa  . Anxiety   . Arthritis    "fingers, lower back" (04/23/2017   . CAD (coronary artery disease) 5009,3818   post PTCA with bare-metal stenting to mid RCA in December 2004     . CHF (congestive heart failure) ([place])   . Chronic atrial fibrillation ([place]) 06/2007   Tachybradycardia pacemaker  . Chronic kidney disease    10% function - ?R, other kidney is compensating    . CVA (cerebral vascular accident) [facility]) 2993,7169   denies residual on 04/23/2017  . Depression   . Diplopia 06/19/2008   Qualifier: Diagnosis of  By: [name] MD, [name]   . Dysrhythmia    ATRIAL FIBRILATION  . Edema of lower extremity   . Hyperlipidemia   . Hypertension   . Inferior myocardial infarction [facility])    acute inferior wall mi/other medical hx  . Myocardial infarction ([place]) [id number]    . OSA on CPAP    last test- 2010  . Pacemaker   . Pneumonia 2014   tx. ----  [facility]  . Pulmonary hypertension ([place])    moderate pulmonary hypertension by 10/2016 echo and 10/2013 cardiac cath  . Shortness of breath   . Skin cancer    "cut off right [place]; burned off LLE" (04/23/2017)  . Spondylolisthesis   . TIA (transient ischemic attack) 2008  . Unspecified hemorrhoids without mention of complication 6/78/9381   Colonoscopy--Dr. Carlean Purl    Past Surgical History:  Procedure Laterality Date  . APPENDECTOMY  1984  . ATRIAL FIBRILLATION ABLATION N/A 09/29/2016   Procedure: Atrial Fibrillation Ablation;  Surgeon: Will Meredith Leeds, MD;  Location: [place] CV LAB;  Service: Cardiovascular;  Laterality: N/A;  . ATRIAL FIBRILLATION ABLATION N/A 02/07/2018   Procedure: ATRIAL FIBRILLATION ABLATION;  Surgeon: Constance Haw, MD;  Location: [place] CV LAB;  Service: Cardiovascular;  Laterality: N/A;  . BACK SURGERY    . CARDIOVERSION N/A 09/12/2017   Procedure: CARDIOVERSION;  Surgeon: Jerline Pain, MD;  Location: [facility] ENDOSCOPY;  Service: Cardiovascular;  Laterality: N/A;  . CARDIOVERSION N/A 12/13/2017   Procedure: CARDIOVERSION;  Surgeon: Sanda Klein, MD;  Location: [place] ENDOSCOPY;  Service: Cardiovascular;  Laterality: N/A;  . CARDIOVERSION N/A 02/18/2018   Procedure: CARDIOVERSION;  Surgeon: Dorothy Spark, MD;  Location: [place];  Service: Cardiovascular;  Laterality: N/A;  . CATARACT EXTRACTION W/ INTRAOCULAR LENS  IMPLANT, BILATERAL Bilateral 01/15/2017- 03/2017  . CORONARY ANGIOPLASTY  X 2  . CORONARY ANGIOPLASTY WITH STENT PLACEMENT  1998; ~ 2007; ?date   "1 stent; replaced stent; not sure when I got the last stent" (04/23/2017)  . DOPPLER ECHOCARDIOGRAPHY  2009  . ELECTROPHYSIOLOGIC STUDY N/A 03/31/2016   Procedure: Cardioversion;  Surgeon: Evans Lance, MD;  Location: [place] CV LAB;  Service: Cardiovascular;  Laterality: N/A;  .  ELECTROPHYSIOLOGIC STUDY N/A 08/04/2016   Procedure: Cardioversion;  Surgeon: Evans Lance, MD;  Location: [place] CV LAB;  Service: Cardiovascular;  Laterality: N/A;  . INSERT / REPLACE / REMOVE PACEMAKER  06/2007  . IR RADIOLOGY PERIPHERAL GUIDED IV START  01/31/2018  . IR US GUIDE VASC ACCESS LEFT  01/31/2018  . JOINT REPLACEMENT    . LAPAROSCOPIC CHOLECYSTECTOMY  1994  . LEFT AND RIGHT HEART CATHETERIZATION WITH CORONARY ANGIOGRAM N/A 10/06/2013   Procedure: LEFT AND RIGHT HEART CATHETERIZATION WITH CORONARY ANGIOGRAM;  Surgeon: Troy Sine, MD;  Location: [facility] CATH LAB;  Service: Cardiovascular;  Laterality: N/A;  . LEFT OOPHORECTOMY Left ~ 1989  . POSTERIOR LUMBAR FUSION  2000s - 04/2015 X 3   L3-4; L4-5; L2-3; Dr [place] Gammon  . RIGHT/LEFT HEART CATH AND CORONARY ANGIOGRAPHY N/A 05/09/2017   Procedure: RIGHT/LEFT HEART CATH AND CORONARY ANGIOGRAPHY;  Surgeon: Sherren Mocha, MD;  Location: [place] CV LAB;  Service: Cardiovascular;  Laterality: N/A;  . SKIN CANCER EXCISION Right    [place]  . TEE WITHOUT CARDIOVERSION N/A 09/29/2016   Procedure: TRANSESOPHAGEAL ECHOCARDIOGRAM (TEE);  Surgeon: Jerline Pain, MD;  Location: [place];  Service: Cardiovascular;  Laterality: N/A;  . TOTAL ABDOMINAL HYSTERECTOMY  1984   "uterus & right ovary"  . TOTAL KNEE ARTHROPLASTY Left 04/23/2017  . TOTAL KNEE ARTHROPLASTY Left 04/23/2017   Procedure: TOTAL KNEE ARTHROPLASTY;  Surgeon: Frederik Pear, MD;  Location: [place];  Service: Orthopedics;  Laterality: Left;  . ULTRASOUND GUIDANCE FOR VASCULAR ACCESS  05/09/2017   Procedure: Ultrasound Guidance For Vascular Access;  Surgeon: Sherren Mocha, MD;  Location: [place] CV LAB;  Service: Cardiovascular;;    Current Outpatient Medications  Medication Sig Dispense Refill  . acetaminophen (TYLENOL) 500 MG tablet Take 500 mg by mouth every morning.     [name] amiodarone (PACERONE) 200 MG tablet Take 1 tablet (200 mg total) by mouth daily. 30 tablet 3  .  Ascorbic Acid (VITAMIN C) 1000 MG tablet Take 1,000 mg by mouth daily.    [name] atorvastatin (LIPITOR) 40 MG tablet TAKE 1 TABLET BY MOUTH EVERY DAY 90 tablet 3  . Calcium Carbonate-Vitamin D (CALCIUM 600+D PO) Take 1 tablet by mouth daily.    . Cyanocobalamin (B-12) 5000 MCG CAPS Take 5,000 mcg by mouth daily.     [name] diltiazem (CARDIZEM CD) 120 MG 24 hr capsule TAKE 1 CAPSULE (120 MG TOTAL) BY MOUTH DAILY. 30 capsule 11  . diphenhydramine-acetaminophen (TYLENOL PM) 25-500 MG TABS tablet Take 1 tablet by mouth at bedtime as needed.     [name] ELIQUIS 5 MG TABS tablet TAKE 1 TABLET BY MOUTH TWICE A DAY 60 tablet 5  . FLUoxetine (PROZAC) 20 MG capsule Take 1 capsule (20 mg total) by mouth every morning. 90 capsule 0  . fluticasone (FLONASE) 50 MCG/ACT nasal spray Place 1 spray into both nostrils at bedtime as needed for allergies.     . furosemide (LASIX) 40 MG tablet Take 40  mg by mouth 2 (two) times daily.    [name] KLOR-CON M20 20 MEQ tablet TAKE 1 TABLET BY MOUTH TWICE A DAY (Patient taking differently: TAKE 20 MEQ BY MOUTH TWICE A DAY) 180 tablet 3  . loratadine (CLARITIN) 10 MG tablet TAKE 1 TABLET BY MOUTH EVERY DAY 30 tablet 5  . Lysine 1000 MG TABS Take 1,000 mg by mouth daily.    . Multiple Vitamin (MULTIVITAMIN WITH MINERALS) TABS tablet Take 1 tablet by mouth daily. Centrum    . nitroGLYCERIN (NITROSTAT) 0.4 MG SL tablet Place 1 tablet (0.4 mg total) under the tongue every 5 (five) minutes x 3 doses as needed for chest pain. 25 tablet 6  . nystatin (MYCOSTATIN/NYSTOP) powder Apply topically 3 (three) times daily for 7 days. 15 g 1  . Polyethyl Glycol-Propyl Glycol (SYSTANE) 0.4-0.3 % SOLN Place 1 drop into both eyes at bedtime as needed (for dry eyes).     . valACYclovir (VALTREX) 1000 MG tablet TAKE 2 TABLETS BY MOUTH AND REPEAT IN 12 HOURS FOR COLD SORES (Patient taking differently: Take 1,000 mg by mouth every 12 (twelve) hours as needed (cold sores). ) 30 tablet 1  . vitamin E 400 UNIT capsule Take  800 Units by mouth daily.      No current facility-administered medications for this encounter.     Allergies  Allergen Reactions  . Adhesive [Tape] Rash and Other (See Comments)    Allergic to EKG stickers and defibrillation pads.    Social History   Socioeconomic History  . Marital status: Married    Spouse name: Not on file  . Number of children: 0  . Years of education: HS  . Highest education level: Not on file  Occupational History  . Occupation: retired    Comment: previously worked [employer]  . Financial resource strain: Not hard at all  . Food insecurity:    Worry: Never true    Inability: Never true  . Transportation needs:    Medical: No    Non-medical: No  Tobacco Use  . Smoking status: Former Smoker    Packs/day: 1.00    Years: 5.00    Pack years: 5.00    Types: Cigarettes    Start date: 05/11/1978    Last attempt to quit: 07/03/1982    Years since quitting: 35.6  . Smokeless tobacco: Never Used  Substance and Sexual Activity  . Alcohol use: Yes    Comment: 04/23/2017 "once q 6 months; glass of wine"  . Drug use: No  . Sexual activity: Not Currently  Lifestyle  . Physical activity:    Days per week: 0 days    Minutes per session: 0 min  . Stress: Rather much  Relationships  . Social connections:    Talks on phone: More than three times a week    Gets together: More than three times a week    Attends religious service: More than 4 times per year    Active member of club or organization: Yes    Attends meetings of clubs or organizations: More than 4 times per year    Relationship status: Married  . Intimate partner violence:    Fear of current or ex partner: No    Emotionally abused: No    Physically abused: No    Forced sexual activity: No  Other Topics Concern  . 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  Married    Originally from [place] of two, high school  education   Former smoker [phone] 1ppd   Hunting dogs 7    Retired from [employer] 2     Family History  Problem Relation Age of Onset  . Suicidality Father        suicide death pt was 3 yrs  . Arrhythmia Mother   . Hypertension Mother   . Diabetes Mother   . Heart attack Brother 5  . Heart disease Paternal Aunt   . Prostate cancer Maternal Grandfather   . Diabetes Paternal Grandfather        fathers side of the family    ROS- All systems are reviewed and negative except as per the HPI above  Physical Exam: Vitals:   02/26/18 0845  BP: 116/74  Pulse: 68  SpO2: 92%  Weight: 112 kg  Height: 5\' 6"  (1.676 m)   Wt Readings from Last 3 Encounters:  02/26/18 112 kg  02/25/18 112 kg  02/18/18 110.7 kg    Labs: Lab Results  Component Value Date   NA 138 02/14/2018   K 4.2 02/14/2018   CL 100 02/14/2018   CO2 27 02/14/2018   GLUCOSE 107 (H) 02/14/2018   BUN 24 (H) 02/14/2018   CREATININE 1.22 (H) 02/14/2018   CALCIUM 9.3 02/14/2018   MG 2.1 11/13/2016   Lab Results  Component Value Date   INR 1.0 05/08/2017   Lab Results  Component Value Date   CHOL 116 10/16/2016   HDL 42 10/16/2016   LDLCALC 46 10/16/2016   TRIG 139 10/16/2016     GEN- The patient is well appearing, alert and oriented x 3 today.   Head- normocephalic, atraumatic Eyes-  Sclera clear, conjunctiva pink Ears- hearing intact Oropharynx- clear Neck- supple, no JVP Lymph- no cervical lymphadenopathy Lungs- Clear to ausculation bilaterally, normal work of breathing Heart- regular  rate and rhythm, no murmurs, rubs or gallops, PMI not laterally displaced GI- soft, NT, ND, + BS Extremities- no clubbing, cyanosis, or edema MS- no significant deformity or atrophy Skin- no rash or lesion Psych- euthymic mood, full affect Neuro- strength and sensation are intact  EKG-Sinus rhythm at 68 bpm, Pr int 214 ms, qrs int 82 ms, qtc 440 ms Epic records  reviewed    Assessment and Plan: 1. Afib S/p afib ablation 8/8 with onset rapid atrial flutter Loaded on amiodarone with successful cardioversion 8/15 Lower dose of amiodarone to 200 mg daily Continue eliquis 5 mg bid for chadsvasc score of at least 6  F/u with Dr. Curt Bears 11/12 afib clinic as needed  Butch Penny C. Bowe Sidor, [facility] [address] [place], [place] [address] [phone]

## 2018-03-01 ENCOUNTER — Other Ambulatory Visit: Payer: Self-pay | Admitting: Cardiovascular Disease

## 2018-03-01 ENCOUNTER — Other Ambulatory Visit: Payer: Self-pay | Admitting: Internal Medicine

## 2018-03-07 ENCOUNTER — Ambulatory Visit (HOSPITAL_COMMUNITY): Payer: PPO | Admitting: Nurse Practitioner

## 2018-03-07 ENCOUNTER — Ambulatory Visit: Payer: PPO | Admitting: Cardiovascular Disease

## 2018-04-01 ENCOUNTER — Other Ambulatory Visit: Payer: Self-pay | Admitting: Physician Assistant

## 2018-04-01 NOTE — Telephone Encounter (Signed)
This is a A-Fib clinic pt 

## 2018-04-08 ENCOUNTER — Other Ambulatory Visit: Payer: Self-pay | Admitting: Cardiology

## 2018-04-10 ENCOUNTER — Other Ambulatory Visit: Payer: Self-pay | Admitting: Cardiovascular Disease

## 2018-04-18 DIAGNOSIS — H35372 Puckering of macula, left eye: Secondary | ICD-10-CM | POA: Diagnosis not present

## 2018-04-18 DIAGNOSIS — H43813 Vitreous degeneration, bilateral: Secondary | ICD-10-CM | POA: Diagnosis not present

## 2018-04-18 DIAGNOSIS — H02831 Dermatochalasis of right upper eyelid: Secondary | ICD-10-CM | POA: Diagnosis not present

## 2018-04-18 DIAGNOSIS — H10413 Chronic giant papillary conjunctivitis, bilateral: Secondary | ICD-10-CM | POA: Diagnosis not present

## 2018-04-18 DIAGNOSIS — Z961 Presence of intraocular lens: Secondary | ICD-10-CM | POA: Diagnosis not present

## 2018-04-18 DIAGNOSIS — H11823 Conjunctivochalasis, bilateral: Secondary | ICD-10-CM | POA: Diagnosis not present

## 2018-04-18 DIAGNOSIS — H40053 Ocular hypertension, bilateral: Secondary | ICD-10-CM | POA: Diagnosis not present

## 2018-04-18 DIAGNOSIS — H02834 Dermatochalasis of left upper eyelid: Secondary | ICD-10-CM | POA: Diagnosis not present

## 2018-04-18 DIAGNOSIS — H04123 Dry eye syndrome of bilateral lacrimal glands: Secondary | ICD-10-CM | POA: Diagnosis not present

## 2018-04-24 ENCOUNTER — Other Ambulatory Visit: Payer: Self-pay | Admitting: Internal Medicine

## 2018-04-26 NOTE — Progress Notes (Deleted)
No chief complaint on file.   HPI: [name] 72 y.o. comes in today for Preventive Medicare exam/ wellness visit .[email or web address]   Health Maintenance  Topic Date Due  . COLONOSCOPY  07/04/2015  . OPHTHALMOLOGY EXAM  01/08/2018  . URINE MICROALBUMIN  01/23/2018  . INFLUENZA VACCINE  01/31/2018  . FOOT EXAM  05/15/2018  . HEMOGLOBIN A1C  05/17/2018  . MAMMOGRAM  11/30/2018  . TETANUS/TDAP  06/03/2019  . DEXA SCAN  Completed  . Hepatitis C Screening  Completed  . PNA vac Low Risk Adult  Completed   Health Maintenance Review LIFESTYLE:  Exercise:   Tobacco/ETS: Alcohol:  Sugar beverages: Sleep: Drug use: no HH:      Hearing:   Vision:  No limitations at present . Last eye check UTD  Safety:  Has smoke detector and wears seat belts.  No firearms. No excess sun exposure. Sees dentist regularly.  Falls:   Advance directive :  Reviewed  Has one.  Memory: Felt to be good  , no concern from her or her family.  Depression: No anhedonia unusual crying or depressive symptoms  Nutrition: Eats well balanced diet; adequate calcium and vitamin D. No swallowing chewing problems.  Injury: no major injuries in the last six months.  Other healthcare providers:  Reviewed today .  Social:  Lives with spouse married. No pets.   Preventive parameters: up-to-date  Reviewed   ADLS:   There are no problems or need for assistance  driving, feeding, obtaining food, dressing, toileting and bathing, managing money using phone. She is independent.  EXERCISE/ HABITS  Per week   No tobacco    etoh   ROS:  GEN/ HEENT: No fever, significant weight changes sweats headaches vision problems hearing changes, CV/ PULM; No chest pain shortness of breath cough, syncope,edema  change in exercise tolerance. GI /GU: No adominal pain, vomiting, change in bowel habits. No blood in the stool. No significant GU symptoms. SKIN/HEME: ,no acute skin rashes suspicious lesions or bleeding. No  lymphadenopathy, nodules, masses.  NEURO/ PSYCH:  No neurologic signs such as weakness numbness. No depression anxiety. IMM/ Allergy: No unusual infections.  Allergy .   REST of 12 system review negative except as per HPI   Past Medical History:  Diagnosis Date  . Anticoagulant long-term use    pradaxa  . Anxiety   . Arthritis    "fingers, lower back" (04/23/2017   . CAD (coronary artery disease) 3557,3220   post PTCA with bare-metal stenting to mid RCA in December 2004     . CHF (congestive heart failure) ([place])   . Chronic atrial fibrillation ([place]) 06/2007   Tachybradycardia pacemaker  . Chronic kidney disease    10% function - ?R, other kidney is compensating    . CVA (cerebral vascular accident) [facility]) 2542,7062   denies residual on 04/23/2017  . Depression   . Diplopia 06/19/2008   Qualifier: Diagnosis of  By: [name] MD, [name]   . Dysrhythmia    ATRIAL FIBRILATION  . Edema of lower extremity   . Hyperlipidemia   . Hypertension   . Inferior myocardial infarction [facility])    acute inferior wall mi/other medical hx  . Myocardial infarction ([place]) [id number]  . OSA on CPAP    last test- 2010  . Pacemaker   . Pneumonia 2014   tx. ----  [facility]  . Pulmonary hypertension ([place])    moderate pulmonary hypertension by 10/2016 echo and 10/2013 cardiac cath  .  Shortness of breath   . Skin cancer    "cut off right [place]; burned off LLE" (04/23/2017)  . Spondylolisthesis   . TIA (transient ischemic attack) 2008  . Unspecified hemorrhoids without mention of complication 5/36/1443   Colonoscopy--Dr. Carlean Purl     Family History  Problem Relation Age of Onset  . Suicidality Father        suicide death pt was 3 yrs  . Arrhythmia Mother   . Hypertension Mother   . Diabetes Mother   . Heart attack Brother 30  . Heart disease Paternal Aunt   . Prostate cancer Maternal Grandfather   . Diabetes Paternal Grandfather        fathers side of the family    Social History    Socioeconomic History  . Marital status: Married    Spouse name: Not on file  . Number of children: 0  . Years of education: HS  . Highest education level: Not on file  Occupational History  . Occupation: retired    Comment: previously worked [employer]  . Financial resource strain: Not hard at all  . Food insecurity:    Worry: Never true    Inability: Never true  . Transportation needs:    Medical: No    Non-medical: No  Tobacco Use  . Smoking status: Former Smoker    Packs/day: 1.00    Years: 5.00    Pack years: 5.00    Types: Cigarettes    Start date: 05/11/1978    Last attempt to quit: 07/03/1982    Years since quitting: 35.8  . Smokeless tobacco: Never Used  Substance and Sexual Activity  . Alcohol use: Yes    Comment: 04/23/2017 "once q 6 months; glass of wine"  . Drug use: No  . Sexual activity: Not Currently  Lifestyle  . Physical activity:    Days per week: 0 days    Minutes per session: 0 min  . Stress: Rather much  Relationships  . Social connections:    Talks on phone: More than three times a week    Gets together: More than three times a week    Attends religious service: More than 4 times per year    Active member of club or organization: Yes    Attends meetings of clubs or organizations: More than 4 times per year    Relationship status: Married  Other Topics Concern  . 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   Married    Originally from [employer] [place] of two, high school education   Former smoker [phone] 1ppd   Hunting dogs 7    Retired from [employer] 2     Outpatient Encounter Medications as of 04/29/2018  Medication Sig  . acetaminophen (TYLENOL) 500 MG tablet Take 500 mg by mouth every morning.   [name] amiodarone (PACERONE) 200 MG tablet Take 1 tablet (200 mg total) by mouth daily.  [name] amiodarone (PACERONE) 200 MG tablet Take 1  tablet (200 mg total) by mouth daily.  . Ascorbic Acid (VITAMIN C) 1000 MG tablet Take 1,000 mg by mouth daily.  [name] atorvastatin (LIPITOR) 40 MG tablet TAKE 1 TABLET BY MOUTH EVERY DAY  . Calcium Carbonate-Vitamin D (CALCIUM 600+D PO) Take 1 tablet by mouth daily.  . Cyanocobalamin (B-12) 5000 MCG CAPS Take 5,000 mcg by mouth daily.   [name]  diltiazem (CARDIZEM CD) 120 MG 24 hr capsule TAKE 1 CAPSULE (120 MG TOTAL) BY MOUTH DAILY.  . diphenhydramine-acetaminophen (TYLENOL PM) 25-500 MG TABS tablet Take 1 tablet by mouth at bedtime as needed.   [name] ELIQUIS 5 MG TABS tablet TAKE 1 TABLET BY MOUTH TWICE A DAY  . FLUoxetine (PROZAC) 20 MG capsule TAKE 1 CAPSULE BY MOUTH EVERY DAY IN THE MORNING  . fluticasone (FLONASE) 50 MCG/ACT nasal spray Place 1 spray into both nostrils at bedtime as needed for allergies.   . furosemide (LASIX) 40 MG tablet Take 40 mg by mouth 2 (two) times daily.  [name] KLOR-CON M20 20 MEQ tablet TAKE 1 TABLET BY MOUTH TWICE A DAY  . loratadine (CLARITIN) 10 MG tablet TAKE 1 TABLET BY MOUTH EVERY DAY  . Lysine 1000 MG TABS Take 1,000 mg by mouth daily.  . Multiple Vitamin (MULTIVITAMIN WITH MINERALS) TABS tablet Take 1 tablet by mouth daily. Centrum  . nitroGLYCERIN (NITROSTAT) 0.4 MG SL tablet Place 1 tablet (0.4 mg total) under the tongue every 5 (five) minutes x 3 doses as needed for chest pain.  Vladimir Faster Glycol-Propyl Glycol (SYSTANE) 0.4-0.3 % SOLN Place 1 drop into both eyes at bedtime as needed (for dry eyes).   . valACYclovir (VALTREX) 1000 MG tablet TAKE 2 TABLETS BY MOUTH AND REPEAT IN 12 HOURS FOR COLD SORES (Patient taking differently: Take 1,000 mg by mouth every 12 (twelve) hours as needed (cold sores). )  . vitamin E 400 UNIT capsule Take 800 Units by mouth daily.    No facility-administered encounter medications on file as of 04/29/2018.     EXAM:  There were no vitals taken for this visit.  There is no height or weight on file to calculate BMI.  Physical  Exam: Vital signs reviewed DUK:GURK is a well-developed well-nourished alert cooperative   who appears stated age in no acute distress.  HEENT: normocephalic atraumatic , Eyes: PERRL EOM's full, conjunctiva clear, Nares: paten,t no deformity discharge or tenderness., Ears: no deformity EAC's clear TMs with normal landmarks. Mouth: clear OP, no lesions, edema.  Moist mucous membranes. Dentition in adequate repair. NECK: supple without masses, thyromegaly or bruits. CHEST/PULM:  Clear to auscultation and percussion breath sounds equal no wheeze , rales or rhonchi. No chest wall deformities or tenderness. CV: PMI is nondisplaced, S1 S2 no gallops, murmurs, rubs. Peripheral pulses are full without delay.No JVD .  ABDOMEN: Bowel sounds normal nontender  No guard or rebound, no hepato splenomegal no CVA tenderness.   Extremtities:  No clubbing cyanosis or edema, no acute joint swelling or redness no focal atrophy NEURO:  Oriented x3, cranial nerves 3-12 appear to be intact, no obvious focal weakness,gait within normal limits no abnormal reflexes or asymmetrical SKIN: No acute rashes normal turgor, color, no bruising or petechiae. PSYCH: Oriented, good eye contact, no obvious depression anxiety, cognition and judgment appear normal. LN: no cervical axillary inguinal adenopathy No noted deficits in memory, attention, and speech.   Lab Results  Component Value Date   WBC 8.9 02/14/2018   HGB 15.1 (H) 02/14/2018   HCT 46.7 (H) 02/14/2018   PLT 257 02/14/2018   GLUCOSE 107 (H) 02/14/2018   CHOL 116 10/16/2016   TRIG 139 10/16/2016   HDL 42 10/16/2016   LDLCALC 46 10/16/2016   ALT 27 01/23/2017   AST 19 01/23/2017   NA 138 02/14/2018   K 4.2 02/14/2018   CL 100 02/14/2018   CREATININE 1.22 (H) 02/14/2018  BUN 24 (H) 02/14/2018   CO2 27 02/14/2018   TSH 1.761 02/14/2018   INR 1.0 05/08/2017   HGBA1C 6.2 11/14/2017   MICROALBUR <0.7 01/23/2017    ASSESSMENT AND PLAN:  Discussed the  following assessment and plan:  No diagnosis found.  Patient Care Team: [name], [name], MD as PCP - General Curt Bears, Ocie Doyne, MD as PCP - Electrophysiology (Cardiology) Nahser, Wonda Cheng, MD as PCP - Cardiology (Cardiology) Evans Lance, MD (Cardiology) Gaynelle Arabian, MD as Consulting Physician (Orthopedic Surgery) Brand Males, MD as Consulting Physician (Pulmonary Disease)  There are no Patient Instructions on file for this visit.  [name]. [name] M.D.

## 2018-04-29 ENCOUNTER — Encounter: Payer: PPO | Admitting: Internal Medicine

## 2018-05-03 ENCOUNTER — Other Ambulatory Visit: Payer: Self-pay | Admitting: Cardiovascular Disease

## 2018-05-03 NOTE — Telephone Encounter (Signed)
Please advise on refill request as requested medication is not on current med list as it was removed at 02/14/18 office visit with the afib clinic. Last refilled 03/07/18. Should the patient still be taking? Thanks, MI

## 2018-05-03 NOTE — Telephone Encounter (Signed)
Left message for patient to call back to discuss whether she is taking furosemide or torsemide. I asked her to call back and ask for triage nurse because I will be leaving the office soon

## 2018-05-03 NOTE — Progress Notes (Signed)
Chief Complaint  Patient presents with  . Annual Exam    Pt currently in Afib    HPI: [name] 72 y.o. comes in today for Preventive Medicare exam/ wellness visit .Since last visit. Lots of stress  And taking care of mom   Under rx for a fib and new meds amiodarone   No syncope  New swelling  No new sob abut the same  No bleeding  On meds  OSA using rx always     Health Maintenance  Topic Date Due  . COLONOSCOPY  07/04/2015  . OPHTHALMOLOGY EXAM  01/08/2018  . URINE MICROALBUMIN  01/23/2018  . HEMOGLOBIN A1C  11/04/2018  . MAMMOGRAM  11/30/2018  . FOOT EXAM  05/07/2019  . TETANUS/TDAP  06/03/2019  . INFLUENZA VACCINE  Completed  . DEXA SCAN  Completed  . Hepatitis C Screening  Completed  . PNA vac Low Risk Adult  Completed   Health Maintenance Review LIFESTYLE:  Exercise:     Every day nursing home .    Helps mom with eating and dressing.  Tobacco/ETS: no Alcohol:   No rare  Sugar beverages:    no Sleep:  Drug use: no HH: 2  Hunting dog outside s husband    Hearing:  Ok   Vision:  No limitations at present . Last eye check UTD  Safety:  Has smoke detector and wears seat belts.   No excess sun exposure.   Memory: Felt to be good  , no concern from her or her family.  Depression: No anhedonia unusual crying or depressive symptoms  Nutrition: Eats well balanced diet; adequate calcium and vitamin D. No swallowing chewing problems.  Injury: no major injuries in the last six months.  Other healthcare providers:  Reviewed today .  Social:  Lives with spouse married.   Preventive parameters:   Reviewed   ADLS:   There are no problems or need for assistance  driving, feeding, obtaining food, dressing, toileting and bathing, managing money using phone. She is independent. Taking   care of mom age 50 and husband had      ROS:  GEN/ HEENT: No fever, significant weight changes sweats headaches vision problems hearing changes, CV/ PULM; No , syncope,  change  in exercise tolerance. GI /GU: No adominal pain, vomiting, change in bowel habits. No blood in the stool. No significant GU symptoms. SKIN/HEME: ,no acute skin rashes suspicious lesions or bleeding. No lymphadenopathy, nodules, masses.  NEURO/ PSYCH:  No neurologic signs such as weakness numbness. No depression anxiety. IMM/ Allergy: No unusual infections.  Allergy .   REST of 12 system review negative except as per HPI   Past Medical History:  Diagnosis Date  . Anticoagulant long-term use    pradaxa  . Anxiety   . Arthritis    "fingers, lower back" (04/23/2017   . CAD (coronary artery disease) [id number]   post PTCA with bare-metal stenting to mid RCA in December 2004     . CHF (congestive heart failure) ([place])   . Chronic atrial fibrillation ([place]) 06/2007   Tachybradycardia pacemaker  . Chronic kidney disease    10% function - ?R, other kidney is compensating    . CVA (cerebral vascular accident) [facility]) [phone]   denies residual on 04/23/2017  . Depression   . Diplopia 06/19/2008   Qualifier: Diagnosis of  By: [name] MD, [name]   . Dysrhythmia    ATRIAL FIBRILATION  . Edema of lower extremity   .  Hyperlipidemia   . Hypertension   . Inferior myocardial infarction [facility])    acute inferior wall mi/other medical hx  . Myocardial infarction ([place]) [id number]  . OSA on CPAP    last test- 2010  . Pacemaker   . Pneumonia 2014   tx. ----  [facility]  . Pulmonary hypertension ([place])    moderate pulmonary hypertension by 10/2016 echo and 10/2013 cardiac cath  . Shortness of breath   . Skin cancer    "cut off right Eckardt; burned off LLE" (04/23/2017)  . Spondylolisthesis   . TIA (transient ischemic attack) 2008  . Unspecified hemorrhoids without mention of complication 02/13/4817   Colonoscopy--Dr. Carlean Purl     Family History  Problem Relation Age of Onset  . Suicidality Father        suicide death pt was 3 yrs  . Arrhythmia Mother   . Hypertension Mother   . Diabetes  Mother   . Heart attack Brother 69  . Heart disease Paternal Aunt   . Prostate cancer Maternal Grandfather   . Diabetes Paternal Grandfather        fathers side of the family    Social History   Socioeconomic History  . Marital status: Married    Spouse name: Not on file  . Number of children: 0  . Years of education: HS  . Highest education level: Not on file  Occupational History  . Occupation: retired    Comment: previously worked [employer]  . Financial resource strain: Not hard at all  . Food insecurity:    Worry: Never true    Inability: Never true  . Transportation needs:    Medical: No    Non-medical: No  Tobacco Use  . Smoking status: Former Smoker    Packs/day: 1.00    Years: 5.00    Pack years: 5.00    Types: Cigarettes    Start date: 05/11/1978    Last attempt to quit: 07/03/1982    Years since quitting: 35.8  . Smokeless tobacco: Never Used  Substance and Sexual Activity  . Alcohol use: Yes    Comment: 04/23/2017 "once q 6 months; glass of wine"  . Drug use: No  . Sexual activity: Not Currently  Lifestyle  . Physical activity:    Days per week: 0 days    Minutes per session: 0 min  . Stress: Rather much  Relationships  . Social connections:    Talks on phone: More than three times a week    Gets together: More than three times a week    Attends religious service: More than 4 times per year    Active member of club or organization: Yes    Attends meetings of clubs or organizations: More than 4 times per year    Relationship status: Married  Other Topics Concern  . 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   Married    Originally from [place] [place] of two, high school education   Former smoker [phone]   Hunting dogs 7    Retired from [employer] 2     Outpatient Encounter Medications as of 05/06/2018  Medication Sig  .  acetaminophen (TYLENOL) 500 MG tablet Take 500 mg by mouth every morning.   [name] amiodarone (PACERONE) 200 MG tablet Take 1 tablet (200 mg total) by mouth daily.  [name]  amiodarone (PACERONE) 200 MG tablet Take 1 tablet (200 mg total) by mouth daily.  . Ascorbic Acid (VITAMIN C) 1000 MG tablet Take 1,000 mg by mouth daily.  [name] atorvastatin (LIPITOR) 40 MG tablet TAKE 1 TABLET BY MOUTH EVERY DAY  . Calcium Carbonate-Vitamin D (CALCIUM 600+D PO) Take 1 tablet by mouth daily.  . Cyanocobalamin (B-12) 5000 MCG CAPS Take 5,000 mcg by mouth daily.   [name] diltiazem (CARDIZEM CD) 120 MG 24 hr capsule TAKE 1 CAPSULE (120 MG TOTAL) BY MOUTH DAILY.  . diphenhydramine-acetaminophen (TYLENOL PM) 25-500 MG TABS tablet Take 1 tablet by mouth at bedtime as needed.   [name] ELIQUIS 5 MG TABS tablet TAKE 1 TABLET BY MOUTH TWICE A DAY  . FLUoxetine (PROZAC) 20 MG capsule TAKE 1 CAPSULE BY MOUTH EVERY DAY IN THE MORNING  . fluticasone (FLONASE) 50 MCG/ACT nasal spray Place 1 spray into both nostrils at bedtime as needed for allergies.   . furosemide (LASIX) 40 MG tablet Take 40 mg by mouth 2 (two) times daily.  [name] KLOR-CON M20 20 MEQ tablet TAKE 1 TABLET BY MOUTH TWICE A DAY  . loratadine (CLARITIN) 10 MG tablet TAKE 1 TABLET BY MOUTH EVERY DAY  . Lysine 1000 MG TABS Take 1,000 mg by mouth daily.  . Multiple Vitamin (MULTIVITAMIN WITH MINERALS) TABS tablet Take 1 tablet by mouth daily. Centrum  . nitroGLYCERIN (NITROSTAT) 0.4 MG SL tablet Place 1 tablet (0.4 mg total) under the tongue every 5 (five) minutes x 3 doses as needed for chest pain.  Vladimir Faster Glycol-Propyl Glycol (SYSTANE) 0.4-0.3 % SOLN Place 1 drop into both eyes at bedtime as needed (for dry eyes).   . valACYclovir (VALTREX) 1000 MG tablet TAKE 2 TABLETS BY MOUTH AND REPEAT IN 12 HOURS FOR COLD SORES (Patient taking differently: Take 1,000 mg by mouth every 12 (twelve) hours as needed (cold sores). )  . vitamin E 400 UNIT capsule Take 800 Units by mouth daily.    No  facility-administered encounter medications on file as of 05/06/2018.     EXAM:  BP 122/88 (BP Location: Right Arm, Patient Position: Sitting, Cuff Size: Large)   Pulse 97   Temp 97.8 F (36.6 C) (Oral)   Ht 5' 5.5" (1.664 m)   Wt 254 lb 8 oz (115.4 kg)   BMI 41.71 kg/m   Body mass index is 41.71 kg/m.  Physical Exam: Vital signs reviewed PYP:PJKD is a well-developed well-nourished alert cooperative   who appears stated age in no acute distress.  HEENT: normocephalic atraumatic , Eyes: PERRL EOM's full, conjunctiva clear, Nares: paten,t no deformity discharge or tenderness., Ears: no deformity EAC's clear TMs with normal landmarks. Mouth: clear OP, no lesions, edema.  Moist mucous membranes. Dentition in adequate repair. NECK: supple without masses, thyromegaly or bruits. CHEST/PULM:  Clear to auscultation and percussion breath sounds equal no wheeze , rales or rhonchi. Breast: normal by inspection . No dimpling, discharge, masses, tenderness or discharge .CV: PMI is nondisplaced, S1 S2 rate ocass irreg  Rate  100 no gallops, murmurs, rubs. Peripheral pulses are fpresent .No JVD .  ABDOMEN: Bowel sounds normal nontender  No guard or rebound, no hepato splenomegal no CVA tenderness.   Extremtities:  No clubbing cyanosis slight edema, no acute joint swelling or redness no focal atrophy NEURO:  Oriented x3, cranial nerves 3-12 appear to be intact, no obvious focal weakness,gait within normal limits no abnormal reflexes or asymmetrical SKIN: No acute rashes normal turgor, color, no bruising or  petechiae. PSYCH: Oriented, good eye contact, no obvious depression anxiety, cognition and judgment appear normal. LN: no cervical axillary  adenopathy No noted deficits in memory, attention, and speech.   Lab Results  Component Value Date   WBC 6.1 05/06/2018   HGB 14.8 05/06/2018   HCT 43.6 05/06/2018   PLT 257.0 05/06/2018   GLUCOSE 117 (H) 05/06/2018   CHOL 152 05/06/2018   TRIG 118.0  05/06/2018   HDL 59.50 05/06/2018   LDLCALC 69 05/06/2018   ALT 26 05/06/2018   AST 24 05/06/2018   NA 140 05/06/2018   K 4.7 05/06/2018   CL 99 05/06/2018   CREATININE 1.22 (H) 05/06/2018   BUN 22 05/06/2018   CO2 31 05/06/2018   TSH 2.37 05/06/2018   INR 1.0 05/08/2017   HGBA1C 6.4 05/06/2018   MICROALBUR <0.7 01/23/2017    ASSESSMENT AND PLAN:  Discussed the following assessment and plan:  Visit for preventive health examination  Medication management - Plan: Basic metabolic panel, CBC with Differential/Platelet, Hemoglobin A1c, Hepatic function panel, Lipid panel, TSH, T4, free  CAD S/P percutaneous coronary angioplasty - Plan: Basic metabolic panel, CBC with Differential/Platelet, Hemoglobin A1c, Hepatic function panel, Lipid panel, TSH, T4, free  Paroxysmal atrial fibrillation (HCC) - Plan: Basic metabolic panel, CBC with Differential/Platelet, Hemoglobin A1c, Hepatic function panel, Lipid panel, TSH, T4, free  Essential hypertension - Plan: Basic metabolic panel, CBC with Differential/Platelet, Hemoglobin A1c, Hepatic function panel, Lipid panel, TSH, T4, free  Anticoagulant long-term use - Plan: Basic metabolic panel, CBC with Differential/Platelet, Hemoglobin A1c, Hepatic function panel, Lipid panel, TSH, T4, free  Pulmonary hypertension (HCC) - Plan: TSH, T4, free  Pre-diabetes - Plan: Basic metabolic panel, CBC with Differential/Platelet, Hemoglobin A1c, Hepatic function panel, Lipid panel, TSH, T4, free  Need for influenza vaccination - Plan: Flu vaccine HIGH DOSE PF (Fluzone High dose)  Long term current use of amiodarone - Plan: Basic metabolic panel, CBC with Differential/Platelet, Hemoglobin A1c, Hepatic function panel, Lipid panel, TSH, T4, free  Obstructive sleep apnea  Severe obesity (BMI >= 40) (HCC)  Caregiver stress Disc    Social stress adc care situation      Counseled.  Related  Should get thyroid  Check on amiodarone  And disc  Patient Care  Team: [name], [name], MD as PCP - General Curt Bears, Ocie Doyne, MD as PCP - Electrophysiology (Cardiology) Nahser, Wonda Cheng, MD as PCP - Cardiology (Cardiology) Evans Lance, MD (Cardiology) Gaynelle Arabian, MD as Consulting Physician (Orthopedic Surgery) Brand Males, MD as Consulting Physician (Pulmonary Disease)  Patient Instructions   Will notify you  of labs when available.  Losing weight in a healthy manner  Advised .[name]    Wt Readings from Last 3 Encounters:  05/06/18 254 lb 8 oz (115.4 kg)  02/26/18 247 lb (112 kg)  02/25/18 247 lb (112 kg)       Preventive Care 11 Years and Older, Female Preventive care refers to lifestyle choices and visits with your health care provider that can promote health and wellness. What does preventive care include?  A yearly physical exam. This is also called an annual well check.  Dental exams once or twice a year.  Routine eye exams. Ask your health care provider how often you should have your eyes checked.  Personal lifestyle choices, including: ? Daily care of your teeth and gums. ? Regular physical activity. ? Eating a healthy diet. ? Avoiding tobacco and drug use. ? Limiting alcohol use. ? Practicing safe  sex. ? Taking low-dose aspirin every day. ? Taking vitamin and mineral supplements as recommended by your health care provider. What happens during an annual well check? The services and screenings done by your health care provider during your annual well check will depend on your age, overall health, lifestyle risk factors, and family history of disease. Counseling Your health care provider may ask you questions about your:  Alcohol use.  Tobacco use.  Drug use.  Emotional well-being.  Home and relationship well-being.  Sexual activity.  Eating habits.  History of falls.  Memory and ability to understand (cognition).  Work and work Statistician.  Reproductive health.  Screening You may have the  following tests or measurements:  Height, weight, and BMI.  Blood pressure.  Lipid and cholesterol levels. These may be checked every 5 years, or more frequently if you are over 8 years old.  Skin check.  Lung cancer screening. You may have this screening every year starting at age 7 if you have a 30-pack-year history of smoking and currently smoke or have quit within the past 15 years.  Fecal occult blood test (FOBT) of the stool. You may have this test every year starting at age 60.  Flexible sigmoidoscopy or colonoscopy. You may have a sigmoidoscopy every 5 years or a colonoscopy every 10 years starting at age 81.  Hepatitis C blood test.  Hepatitis B blood test.  Sexually transmitted disease (STD) testing.  Diabetes screening. This is done by checking your blood sugar (glucose) after you have not eaten for a while (fasting). You may have this done every 1-3 years.  Bone density scan. This is done to screen for osteoporosis. You may have this done starting at age 17.  Mammogram. This may be done every 1-2 years. Talk to your health care provider about how often you should have regular mammograms.  Talk with your health care provider about your test results, treatment options, and if necessary, the need for more tests. Vaccines Your health care provider may recommend certain vaccines, such as:  Influenza vaccine. This is recommended every year.  Tetanus, diphtheria, and acellular pertussis (Tdap, Td) vaccine. You may need a Td booster every 10 years.  Varicella vaccine. You may need this if you have not been vaccinated.  Zoster vaccine. You may need this after age 93.  Measles, mumps, and rubella (MMR) vaccine. You may need at least one dose of MMR if you were born in 1957 or later. You may also need a second dose.  Pneumococcal 13-valent conjugate (PCV13) vaccine. One dose is recommended after age 58.  Pneumococcal polysaccharide (PPSV23) vaccine. One dose is  recommended after age 22.  Meningococcal vaccine. You may need this if you have certain conditions.  Hepatitis A vaccine. You may need this if you have certain conditions or if you travel or work in places where you may be exposed to hepatitis A.  Hepatitis B vaccine. You may need this if you have certain conditions or if you travel or work in places where you may be exposed to hepatitis B.  Haemophilus influenzae type b (Hib) vaccine. You may need this if you have certain conditions.  Talk to your health care provider about which screenings and vaccines you need and how often you need them. This information is not intended to replace advice given to you by your health care provider. Make sure you discuss any questions you have with your health care provider. Document Released: 07/16/2015 Document Revised: 03/08/2016 Document Reviewed:  04/20/2015 Elsevier Interactive Patient Education  2018 [place] [name] M.D.

## 2018-05-06 ENCOUNTER — Ambulatory Visit (INDEPENDENT_AMBULATORY_CARE_PROVIDER_SITE_OTHER): Payer: PPO | Admitting: Internal Medicine

## 2018-05-06 VITALS — BP 122/88 | HR 97 | Temp 97.8°F | Ht 65.5 in | Wt 254.5 lb

## 2018-05-06 DIAGNOSIS — Z23 Encounter for immunization: Secondary | ICD-10-CM

## 2018-05-06 DIAGNOSIS — I10 Essential (primary) hypertension: Secondary | ICD-10-CM

## 2018-05-06 DIAGNOSIS — Z79899 Other long term (current) drug therapy: Secondary | ICD-10-CM

## 2018-05-06 DIAGNOSIS — Z0000 Encounter for general adult medical examination without abnormal findings: Secondary | ICD-10-CM

## 2018-05-06 DIAGNOSIS — Z7901 Long term (current) use of anticoagulants: Secondary | ICD-10-CM | POA: Diagnosis not present

## 2018-05-06 DIAGNOSIS — I272 Pulmonary hypertension, unspecified: Secondary | ICD-10-CM | POA: Diagnosis not present

## 2018-05-06 DIAGNOSIS — I480 Paroxysmal atrial fibrillation: Secondary | ICD-10-CM

## 2018-05-06 DIAGNOSIS — Z9861 Coronary angioplasty status: Secondary | ICD-10-CM

## 2018-05-06 DIAGNOSIS — Z636 Dependent relative needing care at home: Secondary | ICD-10-CM

## 2018-05-06 DIAGNOSIS — R7303 Prediabetes: Secondary | ICD-10-CM | POA: Diagnosis not present

## 2018-05-06 DIAGNOSIS — G4733 Obstructive sleep apnea (adult) (pediatric): Secondary | ICD-10-CM

## 2018-05-06 DIAGNOSIS — I2510 Atherosclerotic heart disease of native coronary artery without angina pectoris: Secondary | ICD-10-CM

## 2018-05-06 LAB — HEMOGLOBIN A1C: Hgb A1c MFr Bld: 6.4 % (ref 4.6–6.5)

## 2018-05-06 LAB — TSH: TSH: 2.37 u[IU]/mL (ref 0.35–4.50)

## 2018-05-06 LAB — CBC WITH DIFFERENTIAL/PLATELET
Basophils Absolute: 0.1 10*3/uL (ref 0.0–0.1)
Basophils Relative: 1.2 % (ref 0.0–3.0)
Eosinophils Absolute: 0.2 10*3/uL (ref 0.0–0.7)
Eosinophils Relative: 2.6 % (ref 0.0–5.0)
HCT: 43.6 % (ref 36.0–46.0)
Hemoglobin: 14.8 g/dL (ref 12.0–15.0)
Lymphocytes Relative: 28 % (ref 12.0–46.0)
Lymphs Abs: 1.7 10*3/uL (ref 0.7–4.0)
MCHC: 34 g/dL (ref 30.0–36.0)
MCV: 98.5 fl (ref 78.0–100.0)
Monocytes Absolute: 0.6 10*3/uL (ref 0.1–1.0)
Monocytes Relative: 10.4 % (ref 3.0–12.0)
Neutro Abs: 3.5 10*3/uL (ref 1.4–7.7)
Neutrophils Relative %: 57.8 % (ref 43.0–77.0)
Platelets: 257 10*3/uL (ref 150.0–400.0)
RBC: 4.42 Mil/uL (ref 3.87–5.11)
RDW: 13 % (ref 11.5–15.5)
WBC: 6.1 10*3/uL (ref 4.0–10.5)

## 2018-05-06 LAB — LIPID PANEL
Cholesterol: 152 mg/dL (ref 0–200)
HDL: 59.5 mg/dL (ref >39.00)
LDL Cholesterol: 69 mg/dL (ref 0–99)
NonHDL: 92.98
Total CHOL/HDL Ratio: 3
Triglycerides: 118 mg/dL (ref 0.0–149.0)
VLDL: 23.6 mg/dL (ref 0.0–40.0)

## 2018-05-06 LAB — HEPATIC FUNCTION PANEL
ALT: 26 U/L (ref 0–35)
AST: 24 U/L (ref 0–37)
Albumin: 4.4 g/dL (ref 3.5–5.2)
Alkaline Phosphatase: 136 U/L — ABNORMAL HIGH (ref 39–117)
Bilirubin, Direct: 0.1 mg/dL (ref 0.0–0.3)
Total Bilirubin: 0.6 mg/dL (ref 0.2–1.2)
Total Protein: 7.4 g/dL (ref 6.0–8.3)

## 2018-05-06 LAB — BASIC METABOLIC PANEL
BUN: 22 mg/dL (ref 6–23)
CO2: 31 mEq/L (ref 19–32)
Calcium: 9.6 mg/dL (ref 8.4–10.5)
Chloride: 99 mEq/L (ref 96–112)
Creatinine, Ser: 1.22 mg/dL — ABNORMAL HIGH (ref 0.40–1.20)
GFR: 46.02 mL/min — ABNORMAL LOW (ref >60.00)
Glucose, Bld: 117 mg/dL — ABNORMAL HIGH (ref 70–99)
Potassium: 4.7 mEq/L (ref 3.5–5.1)
Sodium: 140 mEq/L (ref 135–145)

## 2018-05-06 LAB — T4, FREE: Free T4: 0.83 ng/dL (ref 0.60–1.60)

## 2018-05-06 NOTE — Patient Instructions (Addendum)
Will notify you  of labs when available.  Losing weight in a healthy manner  Advised .[name]    Wt Readings from Last 3 Encounters:  05/06/18 254 lb 8 oz (115.4 kg)  02/26/18 247 lb (112 kg)  02/25/18 247 lb (112 kg)       Preventive Care 72 Years and Older, Female Preventive care refers to lifestyle choices and visits with your health care provider that can promote health and wellness. What does preventive care include?  A yearly physical exam. This is also called an annual well check.  Dental exams once or twice a year.  Routine eye exams. Ask your health care provider how often you should have your eyes checked.  Personal lifestyle choices, including: ? Daily care of your teeth and gums. ? Regular physical activity. ? Eating a healthy diet. ? Avoiding tobacco and drug use. ? Limiting alcohol use. ? Practicing safe sex. ? Taking low-dose aspirin every day. ? Taking vitamin and mineral supplements as recommended by your health care provider. What happens during an annual well check? The services and screenings done by your health care provider during your annual well check will depend on your age, overall health, lifestyle risk factors, and family history of disease. Counseling Your health care provider may ask you questions about your:  Alcohol use.  Tobacco use.  Drug use.  Emotional well-being.  Home and relationship well-being.  Sexual activity.  Eating habits.  History of falls.  Memory and ability to understand (cognition).  Work and work Statistician.  Reproductive health.  Screening You may have the following tests or measurements:  Height, weight, and BMI.  Blood pressure.  Lipid and cholesterol levels. These may be checked every 5 years, or more frequently if you are over 61 years old.  Skin check.  Lung cancer screening. You may have this screening every year starting at age 10 if you have a 30-pack-year history of smoking and currently smoke  or have quit within the past 15 years.  Fecal occult blood test (FOBT) of the stool. You may have this test every year starting at age 58.  Flexible sigmoidoscopy or colonoscopy. You may have a sigmoidoscopy every 5 years or a colonoscopy every 10 years starting at age 28.  Hepatitis C blood test.  Hepatitis B blood test.  Sexually transmitted disease (STD) testing.  Diabetes screening. This is done by checking your blood sugar (glucose) after you have not eaten for a while (fasting). You may have this done every 1-3 years.  Bone density scan. This is done to screen for osteoporosis. You may have this done starting at age 52.  Mammogram. This may be done every 1-2 years. Talk to your health care provider about how often you should have regular mammograms.  Talk with your health care provider about your test results, treatment options, and if necessary, the need for more tests. Vaccines Your health care provider may recommend certain vaccines, such as:  Influenza vaccine. This is recommended every year.  Tetanus, diphtheria, and acellular pertussis (Tdap, Td) vaccine. You may need a Td booster every 10 years.  Varicella vaccine. You may need this if you have not been vaccinated.  Zoster vaccine. You may need this after age 72.  Measles, mumps, and rubella (MMR) vaccine. You may need at least one dose of MMR if you were born in 1957 or later. You may also need a second dose.  Pneumococcal 13-valent conjugate (PCV13) vaccine. One dose is recommended after age  72.  Pneumococcal polysaccharide (PPSV23) vaccine. One dose is recommended after age 72.  Meningococcal vaccine. You may need this if you have certain conditions.  Hepatitis A vaccine. You may need this if you have certain conditions or if you travel or work in places where you may be exposed to hepatitis A.  Hepatitis B vaccine. You may need this if you have certain conditions or if you travel or work in places where you  may be exposed to hepatitis B.  Haemophilus influenzae type b (Hib) vaccine. You may need this if you have certain conditions.  Talk to your health care provider about which screenings and vaccines you need and how often you need them. This information is not intended to replace advice given to you by your health care provider. Make sure you discuss any questions you have with your health care provider. Document Released: 07/16/2015 Document Revised: 03/08/2016 Document Reviewed: 04/20/2015 Elsevier Interactive Patient Education  [employer].

## 2018-05-07 ENCOUNTER — Encounter: Payer: Self-pay | Admitting: Internal Medicine

## 2018-05-08 ENCOUNTER — Telehealth: Payer: Self-pay | Admitting: Cardiovascular Disease

## 2018-05-08 MED ORDER — FUROSEMIDE 40 MG PO TABS: 40.0000 mg | ORAL_TABLET | Freq: Two times a day (BID) | ORAL | 1 refills | Status: DC

## 2018-05-08 NOTE — Telephone Encounter (Signed)
RX for furosemide sent to pharmacy. Patient is not on torsemide.

## 2018-05-08 NOTE — Telephone Encounter (Signed)
This is a A-Fib clinic pt 

## 2018-05-08 NOTE — Telephone Encounter (Signed)
New message       *STAT* If patient is at the pharmacy, call can be transferred to refill team.   1. Which medications need to be refilled? (please list name of each medication and dose if known) torsemide   2. Which pharmacy/location (including street and city if local pharmacy) is medication to be sent to?[id number] - [address], [place]  3. Do they need a 30 day or 90 day supply?[name]

## 2018-05-09 ENCOUNTER — Other Ambulatory Visit (HOSPITAL_COMMUNITY): Payer: Self-pay | Admitting: *Deleted

## 2018-05-09 MED ORDER — TORSEMIDE 20 MG PO TABS: 40.0000 mg | ORAL_TABLET | Freq: Two times a day (BID) | ORAL | 3 refills | Status: DC

## 2018-05-14 ENCOUNTER — Encounter: Payer: Self-pay | Admitting: Cardiology

## 2018-05-14 ENCOUNTER — Ambulatory Visit: Payer: PPO | Admitting: Cardiology

## 2018-05-14 VITALS — BP 126/76 | HR 113 | Ht 65.5 in | Wt 253.0 lb

## 2018-05-14 DIAGNOSIS — Z96652 Presence of left artificial knee joint: Secondary | ICD-10-CM | POA: Diagnosis not present

## 2018-05-14 DIAGNOSIS — G4733 Obstructive sleep apnea (adult) (pediatric): Secondary | ICD-10-CM

## 2018-05-14 DIAGNOSIS — I495 Sick sinus syndrome: Secondary | ICD-10-CM

## 2018-05-14 DIAGNOSIS — Z01812 Encounter for preprocedural laboratory examination: Secondary | ICD-10-CM | POA: Diagnosis not present

## 2018-05-14 DIAGNOSIS — I4819 Other persistent atrial fibrillation: Secondary | ICD-10-CM | POA: Diagnosis not present

## 2018-05-14 DIAGNOSIS — I10 Essential (primary) hypertension: Secondary | ICD-10-CM

## 2018-05-14 DIAGNOSIS — I484 Atypical atrial flutter: Secondary | ICD-10-CM

## 2018-05-14 DIAGNOSIS — M25562 Pain in left knee: Secondary | ICD-10-CM | POA: Diagnosis not present

## 2018-05-14 LAB — BASIC METABOLIC PANEL
BUN/Creatinine Ratio: 16 (ref 12–28)
BUN: 20 mg/dL (ref 8–27)
CO2: 24 mmol/L (ref 20–29)
Calcium: 9.6 mg/dL (ref 8.7–10.3)
Chloride: 99 mmol/L (ref 96–106)
Creatinine, Ser: 1.24 mg/dL — ABNORMAL HIGH (ref 0.57–1.00)
GFR calc Af Amer: 50 mL/min/{1.73_m2} — ABNORMAL LOW (ref >59)
GFR calc non Af Amer: 43 mL/min/{1.73_m2} — ABNORMAL LOW (ref >59)
Glucose: 113 mg/dL — ABNORMAL HIGH (ref 65–99)
Potassium: 3.6 mmol/L (ref 3.5–5.2)
Sodium: 143 mmol/L (ref 134–144)

## 2018-05-14 LAB — CBC
Hematocrit: 43.6 % (ref 34.0–46.6)
Hemoglobin: 14.8 g/dL (ref 11.1–15.9)
MCH: 32.5 pg (ref 26.6–33.0)
MCHC: 33.9 g/dL (ref 31.5–35.7)
MCV: 96 fL (ref 79–97)
Platelets: 267 10*3/uL (ref 150–450)
RBC: 4.55 x10E6/uL (ref 3.77–5.28)
RDW: 12 % — ABNORMAL LOW (ref 12.3–15.4)
WBC: 7 10*3/uL (ref 3.4–10.8)

## 2018-05-14 MED ORDER — DILTIAZEM HCL ER COATED BEADS 240 MG PO CP24: 240.0000 mg | ORAL_CAPSULE | Freq: Every day | ORAL | 3 refills | Status: DC

## 2018-05-14 NOTE — Addendum Note (Signed)
Addended by: Marciano Sequin on: 05/14/2018 11:36 AM   Modules accepted: Orders

## 2018-05-14 NOTE — Patient Instructions (Signed)
Medication Instructions:  Your physician has recommended you make the following change in your medication: 1. INCREASE Diltiazem to 240 mg once daily  * If you need a refill on your cardiac medications before your next appointment, please call your pharmacy.   Labwork: Pre procedure labs today: BMP & CBC *We will only notify you of abnormal results, otherwise continue current treatment plan.  Testing/Procedures: Your physician has recommended that you have a Cardioversion (DCCV). Electrical Cardioversion uses a jolt of electricity to your heart either through paddles or wired patches attached to your chest. This is a controlled, usually prescheduled, procedure. Defibrillation is done under light anesthesia in the hospital, and you usually go home the day of the procedure. This is done to get your heart back into a normal rhythm. You are not awake for the procedure.  Follow-Up: Your physician recommends that you schedule a follow-up appointment end of January/beginning of February 2020, with Dr. Curt Bears.  *Please note that any paperwork needing to be filled out by the provider will need to be addressed at the front desk prior to seeing the provider. Please note that any FMLA, disability or other documents regarding health condition is subject to a $25.00 charge that must be received prior to completion of paperwork in the form of a money order or check.  Thank you for choosing [facility]!!   Trinidad Curet, RN [phone]  Any Other Special Instructions Will Be Listed Below (If Applicable).   Your provider has recommended a cardioversion.   You are scheduled for a cardioversion on 05/20/2018  with Dr. Radford Pax or associates. Please go to [facility] at 10:00 a.m..  Enter through the [place] not have any food or drink after midnight the night before. You may take your medicines with a sip of water on the day of your procedure.  You will need someone to drive you  home following your procedure.   Call the [place] office at [phone] if you have any questions, problems or concerns.      Electrical Cardioversion Electrical cardioversion is the delivery of a jolt of electricity to change the rhythm of the heart. Sticky patches or metal paddles are placed on the chest to deliver the electricity from a device. This is done to restore a normal rhythm. A rhythm that is too fast or not regular keeps the heart from pumping well. Electrical cardioversion is done in an emergency if:   There is low or no blood pressure as a result of the heart rhythm.    Normal rhythm must be restored as fast as possible to protect the brain and heart from further damage.    It may save a life. Cardioversion may be done for heart rhythms that are not immediately life threatening, such as atrial fibrillation or flutter, in which:   The heart is beating too fast or is not regular.    Medicine to change the rhythm has not worked.    It is safe to wait in order to allow time for preparation.  Symptoms of the abnormal rhythm are bothersome.  The risk of stroke and other serious problems can be reduced.  LET [facility] CARE PROVIDER KNOW ABOUT:   Any allergies you have.  All medicines you are taking, including vitamins, herbs, eye drops, creams, and over-the-counter medicines.  Previous problems you or members of your family have had with the use of anesthetics.    Any blood disorders  you have.    Previous surgeries you have had.    Medical conditions you have.   RISKS AND COMPLICATIONS  Generally, this is a safe procedure. However, problems can occur and include:   Breathing problems related to the anesthetic used.  A blood clot that breaks free and travels to other parts of your body. This could cause a stroke or other problems. The risk of this is lowered by use of blood-thinning medicine (anticoagulant) prior to the  procedure.  Cardiac arrest (rare).   BEFORE THE PROCEDURE   You may have tests to detect blood clots in your heart and to evaluate heart function.   You may start taking anticoagulants so your blood does not clot as easily.    Medicines may be given to help stabilize your heart rate and rhythm.   PROCEDURE  You will be given medicine through an IV tube to reduce discomfort and make you sleepy (sedative).    An electrical shock will be delivered.   AFTER THE PROCEDURE Your heart rhythm will be watched to make sure it does not change. You will need someone to drive you home.

## 2018-05-14 NOTE — H&P (View-Only) (Signed)
Electrophysiology Office Note   Date:  05/14/2018   ID:  [name], DOB [birth year], MRN [id number]  PCP:  [name], MD  Primary Electrophysiologist: Gaye Alken, MD    No chief complaint on file.    History of Present Illness: [name] is a 72 y.o. female who presents today for electrophysiology evaluation.   [name] has a history of coronary artery disease status post stent to the RCA in 2004, CHF, atrial fibrillation with tachybradycardia syndrome status post pacemaker, CKD, hypertension, hyperlipidemia, inferior wall MI, sleep apnea on CPAP, stroke. [name] is maintaining sinus rhythm on CPAP and dofetilide until [name] got an infection and when in atrial fibrillation. Had ablation on 09/29/16.  Unfortunately, over the last few months, [name] has required 2 cardioversions.  [name] had repeat ablation 02/07/2018.  Today, denies symptoms of palpitations, chest pain, shortness of breath, orthopnea, PND, lower extremity edema, claudication, dizziness, presyncope, syncope, bleeding, or neurologic sequela. The patient is tolerating medications without difficulties.  Unfortunately, [name] went back into atrial fibrillation 2 weeks ago.  [name] is also had an atrial flutter.  [name] is currently under quite a bit of stress as her husband had his hip dislocated, and her mother is quite ill.  [name] feels like her stress is contributing to her episodes of atrial fibrillation.   Past Medical History:  Diagnosis Date  . Anticoagulant long-term use    pradaxa  . Anxiety   . Arthritis    "fingers, lower back" (04/23/2017   . CAD (coronary artery disease) [id number]   post PTCA with bare-metal stenting to mid RCA in December 2004     . CHF (congestive heart failure) ([place])   . Chronic atrial fibrillation 06/2007   Tachybradycardia pacemaker  . Chronic kidney disease    10% function - ?R, other kidney is compensating    . CVA (cerebral vascular accident) [facility]) [phone]   denies residual on  04/23/2017  . Depression   . Diplopia 06/19/2008   Qualifier: Diagnosis of  By: [name] MD, [name]   . Dysrhythmia    ATRIAL FIBRILATION  . Edema of lower extremity   . Hyperlipidemia   . Hypertension   . Inferior myocardial infarction [facility])    acute inferior wall mi/other medical hx  . Myocardial infarction ([place]) [id number]  . OSA on CPAP    last test- 2010  . Pacemaker   . Pneumonia 2014   tx. ----  [facility]  . Pulmonary hypertension ([place])    moderate pulmonary hypertension by 10/2016 echo and 10/2013 cardiac cath  . Shortness of breath   . Skin cancer    "cut off right [place]; burned off LLE" (04/23/2017)  . Spondylolisthesis   . TIA (transient ischemic attack) 2008  . Unspecified hemorrhoids without mention of complication 02/17/5630   Colonoscopy--Dr. Carlean Purl    Past Surgical History:  Procedure Laterality Date  . APPENDECTOMY  1984  . ATRIAL FIBRILLATION ABLATION N/A 09/29/2016   Procedure: Atrial Fibrillation Ablation;  Surgeon: Jazmene Racz Meredith Leeds, MD;  Location: [place] CV LAB;  Service: Cardiovascular;  Laterality: N/A;  . ATRIAL FIBRILLATION ABLATION N/A 02/07/2018   Procedure: ATRIAL FIBRILLATION ABLATION;  Surgeon: Constance Haw, MD;  Location: [place] CV LAB;  Service: Cardiovascular;  Laterality: N/A;  . BACK SURGERY    . CARDIOVERSION N/A 09/12/2017   Procedure: CARDIOVERSION;  Surgeon: Jerline Pain, MD;  Location: [facility] ENDOSCOPY;  Service: Cardiovascular;  Laterality: N/A;  . CARDIOVERSION  N/A 12/13/2017   Procedure: CARDIOVERSION;  Surgeon: Sanda Klein, MD;  Location: [place] ENDOSCOPY;  Service: Cardiovascular;  Laterality: N/A;  . CARDIOVERSION N/A 02/18/2018   Procedure: CARDIOVERSION;  Surgeon: Dorothy Spark, MD;  Location: [facility] ENDOSCOPY;  Service: Cardiovascular;  Laterality: N/A;  . CATARACT EXTRACTION W/ INTRAOCULAR LENS  IMPLANT, BILATERAL Bilateral 01/15/2017- 03/2017  . CORONARY ANGIOPLASTY  X 2  . CORONARY ANGIOPLASTY WITH STENT  PLACEMENT  1998; ~ 2007; ?date   "1 stent; replaced stent; not sure when I got the last stent" (04/23/2017)  . DOPPLER ECHOCARDIOGRAPHY  2009  . ELECTROPHYSIOLOGIC STUDY N/A 03/31/2016   Procedure: Cardioversion;  Surgeon: Evans Lance, MD;  Location: [place] CV LAB;  Service: Cardiovascular;  Laterality: N/A;  . ELECTROPHYSIOLOGIC STUDY N/A 08/04/2016   Procedure: Cardioversion;  Surgeon: Evans Lance, MD;  Location: [place] CV LAB;  Service: Cardiovascular;  Laterality: N/A;  . INSERT / REPLACE / REMOVE PACEMAKER  06/2007  . IR RADIOLOGY PERIPHERAL GUIDED IV START  01/31/2018  . IR US GUIDE VASC ACCESS LEFT  01/31/2018  . JOINT REPLACEMENT    . LAPAROSCOPIC CHOLECYSTECTOMY  1994  . LEFT AND RIGHT HEART CATHETERIZATION WITH CORONARY ANGIOGRAM N/A 10/06/2013   Procedure: LEFT AND RIGHT HEART CATHETERIZATION WITH CORONARY ANGIOGRAM;  Surgeon: Troy Sine, MD;  Location: [facility] CATH LAB;  Service: Cardiovascular;  Laterality: N/A;  . LEFT OOPHORECTOMY Left ~ 1989  . POSTERIOR LUMBAR FUSION  2000s - 04/2015 X 3   L3-4; L4-5; L2-3; Dr Trenton Gammon  . RIGHT/LEFT HEART CATH AND CORONARY ANGIOGRAPHY N/A 05/09/2017   Procedure: RIGHT/LEFT HEART CATH AND CORONARY ANGIOGRAPHY;  Surgeon: Sherren Mocha, MD;  Location: [place] CV LAB;  Service: Cardiovascular;  Laterality: N/A;  . SKIN CANCER EXCISION Right    [place]  . TEE WITHOUT CARDIOVERSION N/A 09/29/2016   Procedure: TRANSESOPHAGEAL ECHOCARDIOGRAM (TEE);  Surgeon: Jerline Pain, MD;  Location: [place];  Service: Cardiovascular;  Laterality: N/A;  . TOTAL ABDOMINAL HYSTERECTOMY  1984   "uterus & right ovary"  . TOTAL KNEE ARTHROPLASTY Left 04/23/2017  . TOTAL KNEE ARTHROPLASTY Left 04/23/2017   Procedure: TOTAL KNEE ARTHROPLASTY;  Surgeon: Frederik Pear, MD;  Location: [place];  Service: Orthopedics;  Laterality: Left;  . ULTRASOUND GUIDANCE FOR VASCULAR ACCESS  05/09/2017   Procedure: Ultrasound Guidance For Vascular Access;  Surgeon: Sherren Mocha, MD;  Location: [place] CV LAB;  Service: Cardiovascular;;     Current Outpatient Medications  Medication Sig Dispense Refill  . acetaminophen (TYLENOL) 500 MG tablet Take 500 mg by mouth every morning.     [name] amiodarone (PACERONE) 200 MG tablet Take 1 tablet (200 mg total) by mouth daily. 30 tablet 3  . Ascorbic Acid (VITAMIN C) 1000 MG tablet Take 1,000 mg by mouth daily.    [name] atorvastatin (LIPITOR) 40 MG tablet TAKE 1 TABLET BY MOUTH EVERY DAY 90 tablet 3  . Calcium Carbonate-Vitamin D (CALCIUM 600+D PO) Take 1 tablet by mouth daily.    . Cyanocobalamin (B-12) 5000 MCG CAPS Take 5,000 mcg by mouth daily.     [name] diltiazem (CARDIZEM CD) 120 MG 24 hr capsule TAKE 1 CAPSULE (120 MG TOTAL) BY MOUTH DAILY. 30 capsule 11  . diphenhydramine-acetaminophen (TYLENOL PM) 25-500 MG TABS tablet Take 1 tablet by mouth at bedtime as needed.     [name] ELIQUIS 5 MG TABS tablet TAKE 1 TABLET BY MOUTH TWICE A DAY 180 tablet 1  . FLUoxetine (PROZAC) 20 MG capsule  TAKE 1 CAPSULE BY MOUTH EVERY DAY IN THE MORNING 90 capsule 0  . fluticasone (FLONASE) 50 MCG/ACT nasal spray Place 1 spray into both nostrils at bedtime as needed for allergies.     [name] KLOR-CON M20 20 MEQ tablet TAKE 1 TABLET BY MOUTH TWICE A DAY 180 tablet 3  . loratadine (CLARITIN) 10 MG tablet TAKE 1 TABLET BY MOUTH EVERY DAY 90 tablet 1  . Lysine 1000 MG TABS Take 1,000 mg by mouth daily.    . Multiple Vitamin (MULTIVITAMIN WITH MINERALS) TABS tablet Take 1 tablet by mouth daily. Centrum    . nitroGLYCERIN (NITROSTAT) 0.4 MG SL tablet Place 1 tablet (0.4 mg total) under the tongue every 5 (five) minutes x 3 doses as needed for chest pain. 25 tablet 6  . Polyethyl Glycol-Propyl Glycol (SYSTANE) 0.4-0.3 % SOLN Place 1 drop into both eyes at bedtime as needed (for dry eyes).     . torsemide (DEMADEX) 20 MG tablet Take 2 tablets (40 mg total) by mouth 2 (two) times daily. 180 tablet 3  . valACYclovir (VALTREX) 1000 MG tablet TAKE 2 TABLETS BY  MOUTH AND REPEAT IN 12 HOURS FOR COLD SORES (Patient taking differently: Take 1,000 mg by mouth every 12 (twelve) hours as needed (cold sores). ) 30 tablet 1  . vitamin E 400 UNIT capsule Take 800 Units by mouth daily.      No current facility-administered medications for this visit.     Allergies:   Adhesive [tape]   Social History:  The patient  reports that [name] quit smoking about 35 years ago. Her smoking use included cigarettes. [name] started smoking about 40 years ago. [name] has a 5.00 pack-year smoking history. [name] has never used smokeless tobacco. [name] reports that [name] drinks alcohol. [name] reports that [name] does not use drugs.   Family History:  The patient's family history includes Arrhythmia in her mother; Diabetes in her mother and paternal grandfather; Heart attack (age of onset: 91) in her brother; Heart disease in her paternal aunt; Hypertension in her mother; Prostate cancer in her maternal grandfather; Suicidality in her father.   ROS:  Please see the history of present illness.   Otherwise, review of systems is positive for leg swelling, shortness of breath, palpitations, back pain.   All other systems are reviewed and negative.   PHYSICAL EXAM: VS:  BP 126/76   Pulse (!) 113   Ht 5' 5.5" (1.664 m)   Wt 253 lb (114.8 kg)   BMI 41.46 kg/m  , BMI Body mass index is 41.46 kg/m. GEN: Well nourished, well developed, in no acute distress  HEENT: normal  Neck: no JVD, carotid bruits, or masses Cardiac: iRRR; no murmurs, rubs, or gallops,no edema  Respiratory:  clear to auscultation bilaterally, normal work of breathing GI: soft, nontender, nondistended, + BS MS: no deformity or atrophy  Skin: warm and dry, device site well healed Neuro:  Strength and sensation are intact Psych: euthymic mood, full affect  EKG:  EKG is ordered today. Personal review of the ekg ordered shows atrial flutter, rate 113  Personal review of the device interrogation today. Results in [place]: 05/06/2018: ALT 26; BUN 22; Creatinine, Ser 1.22; Hemoglobin 14.8; Platelets 257.0; Potassium 4.7; Sodium 140; TSH 2.37    Lipid Panel     Component Value Date/Time   CHOL 152 05/06/2018 0923   CHOL 116 10/16/2016 0850   TRIG 118.0 05/06/2018 0923   HDL 59.50 05/06/2018 0923  HDL 42 10/16/2016 0850   CHOLHDL 3 05/06/2018 0923   VLDL 23.6 05/06/2018 0923   LDLCALC 69 05/06/2018 0923   LDLCALC 46 10/16/2016 0850     Wt Readings from Last 3 Encounters:  05/14/18 253 lb (114.8 kg)  05/06/18 254 lb 8 oz (115.4 kg)  02/26/18 247 lb (112 kg)      Other studies Reviewed: Additional studies/ records that were reviewed today include: TTE 03/28/16  Review of the above records today demonstrates:  - Left ventricle: The cavity size was normal. Wall thickness was   increased in a pattern of mild LVH. Systolic function was normal.   The estimated ejection fraction was in the range of 55% to 60%.   Indeterminant diastolic function (atrial fibrillation). Wall   motion was normal; there were no regional wall motion   abnormalities. - Aortic valve: There was no stenosis. - Mitral valve: Mildly calcified annulus. There was no significant   regurgitation. - Left atrium: The atrium was mildly dilated. - Right ventricle: The cavity size was normal. Pacer wire or   catheter noted in right ventricle. Systolic function was normal. - Tricuspid valve: Peak RV-RA gradient (S): 22 mm Hg. - Pulmonary arteries: PA peak pressure: 30 mm Hg (S). - Systemic veins: IVC measured 2.3 cm with normal respirophasic   variation, suggesting RA pressure 8 mmHg.  LHC/RHC 05/09/17 1.  Single-vessel coronary artery disease with continued patency of the stented segment in the right coronary artery and chronic occlusion of a small acute marginal branch collateralized by the left coronary artery 2.  Patent left main, LAD, and left circumflex without significant stenosis 3.  Vigorous LV systolic function with  mildly elevated LVEDP 4.  Severe pulmonary hypertension with mean pulmonary artery pressure of 48 mmHg.  Because of high cardiac output (8.5 L/min), the patient's pulmonary vascular resistance is only mildly increased at 3 [place] units.  ASSESSMENT AND PLAN:  1.  Persistent atrial fibrillation: Atrial fibrillation ablation 09/29/2016 with repeat ablation on 02/07/2018.  Unfortunately [name] has gone back into both atrial fibrillation and has had an atypical atrial flutter.  We Flor Houdeshell thus plan for cardioversion.  [name] is currently on amiodarone and has not missed any doses of her anticoagulation.  I talked to her about the possibility of ablating her atypical atrial flutter.  [name] would like to wait until after the holidays.  Lucion Dilger increase diltiazem to 240 mg.  This patients CHA2DS2-VASc Score and unadjusted Ischemic Stroke Rate (% per year) is equal to 3.2 % stroke rate/year from a score of 3  Above score calculated as 1 point each if present [CHF, HTN, DM, Vascular=MI/PAD/Aortic Plaque, Age if 65-74, or Female] Above score calculated as 2 points each if present [Age > 75, or Stroke/TIA/TE]   2. Hypertension: Currently well controlled  3. Obesity: Weight loss encouraged  4. Chronic diastolic heart failure: No current volume overload  5. Obstructive sleep apnea: CPAP compliance encouraged  6.  Sinus node dysfunction: Status post Saint Jude dual-chamber pacemaker functioning appropriately  Current medicines are reviewed at length with the patient today.   The patient does not have concerns regarding her medicines.  The following changes were made today: Increase diltiazem  Labs/ tests ordered today include:  Orders Placed This Encounter  Procedures  . EKG 12-Lead     Disposition:   FU with Dalisa Forrer to months  Signed, Naydeen Speirs Meredith Leeds, MD  05/14/2018 10:46 AM     [address]  [address] [place] [address] [phone] (office) [phone] (fax)

## 2018-05-14 NOTE — Addendum Note (Signed)
Addended by: Stanton Kidney on: 05/14/2018 11:17 AM   Modules accepted: Orders

## 2018-05-14 NOTE — Progress Notes (Signed)
Electrophysiology Office Note   Date:  05/14/2018   ID:  [name], DOB 02/17/1946, MRN [id number]  PCP:  [name], MD  Primary Electrophysiologist: Gaye Alken, MD    No chief complaint on file.    History of Present Illness: [name] is a 72 y.o. female who presents today for electrophysiology evaluation.   She has a history of coronary artery disease status post stent to the RCA in 2004, CHF, atrial fibrillation with tachybradycardia syndrome status post pacemaker, CKD, hypertension, hyperlipidemia, inferior wall MI, sleep apnea on CPAP, stroke. She is maintaining sinus rhythm on CPAP and dofetilide until she got an infection and when in atrial fibrillation. Had ablation on 09/29/16.  Unfortunately, over the last few months, she has required 2 cardioversions.  She had repeat ablation 02/07/2018.  Today, denies symptoms of palpitations, chest pain, shortness of breath, orthopnea, PND, lower extremity edema, claudication, dizziness, presyncope, syncope, bleeding, or neurologic sequela. The patient is tolerating medications without difficulties.  Unfortunately, she went back into atrial fibrillation 2 weeks ago.  She is also had an atrial flutter.  She is currently under quite a bit of stress as her husband had his hip dislocated, and her mother is quite ill.  She feels like her stress is contributing to her episodes of atrial fibrillation.   Past Medical History:  Diagnosis Date  . Anticoagulant long-term use    pradaxa  . Anxiety   . Arthritis    "fingers, lower back" (04/23/2017   . CAD (coronary artery disease) 2841,3244   post PTCA with bare-metal stenting to mid RCA in December 2004     . CHF (congestive heart failure) ([place])   . Chronic atrial fibrillation 06/2007   Tachybradycardia pacemaker  . Chronic kidney disease    10% function - ?R, other kidney is compensating    . CVA (cerebral vascular accident) [facility]) [phone]   denies residual on  04/23/2017  . Depression   . Diplopia 06/19/2008   Qualifier: Diagnosis of  By: [name] MD, [name]   . Dysrhythmia    ATRIAL FIBRILATION  . Edema of lower extremity   . Hyperlipidemia   . Hypertension   . Inferior myocardial infarction [facility])    acute inferior wall mi/other medical hx  . Myocardial infarction ([place]) [id number]  . OSA on CPAP    last test- 2010  . Pacemaker   . Pneumonia 2014   tx. ----  [facility]  . Pulmonary hypertension ([place])    moderate pulmonary hypertension by 10/2016 echo and 10/2013 cardiac cath  . Shortness of breath   . Skin cancer    "cut off right [place]; burned off LLE" (04/23/2017)  . Spondylolisthesis   . TIA (transient ischemic attack) 2008  . Unspecified hemorrhoids without mention of complication 6/64/4034   Colonoscopy--Dr. Carlean Purl    Past Surgical History:  Procedure Laterality Date  . APPENDECTOMY  1984  . ATRIAL FIBRILLATION ABLATION N/A 09/29/2016   Procedure: Atrial Fibrillation Ablation;  Surgeon: Xaviar Lunn Meredith Leeds, MD;  Location: [place] CV LAB;  Service: Cardiovascular;  Laterality: N/A;  . ATRIAL FIBRILLATION ABLATION N/A 02/07/2018   Procedure: ATRIAL FIBRILLATION ABLATION;  Surgeon: Constance Haw, MD;  Location: [place] CV LAB;  Service: Cardiovascular;  Laterality: N/A;  . BACK SURGERY    . CARDIOVERSION N/A 09/12/2017   Procedure: CARDIOVERSION;  Surgeon: Jerline Pain, MD;  Location: [facility] ENDOSCOPY;  Service: Cardiovascular;  Laterality: N/A;  . CARDIOVERSION  N/A 12/13/2017   Procedure: CARDIOVERSION;  Surgeon: Sanda Klein, MD;  Location: [place] ENDOSCOPY;  Service: Cardiovascular;  Laterality: N/A;  . CARDIOVERSION N/A 02/18/2018   Procedure: CARDIOVERSION;  Surgeon: Dorothy Spark, MD;  Location: [facility] ENDOSCOPY;  Service: Cardiovascular;  Laterality: N/A;  . CATARACT EXTRACTION W/ INTRAOCULAR LENS  IMPLANT, BILATERAL Bilateral 01/15/2017- 03/2017  . CORONARY ANGIOPLASTY  X 2  . CORONARY ANGIOPLASTY WITH STENT  PLACEMENT  1998; ~ 2007; ?date   "1 stent; replaced stent; not sure when I got the last stent" (04/23/2017)  . DOPPLER ECHOCARDIOGRAPHY  2009  . ELECTROPHYSIOLOGIC STUDY N/A 03/31/2016   Procedure: Cardioversion;  Surgeon: Evans Lance, MD;  Location: [place] CV LAB;  Service: Cardiovascular;  Laterality: N/A;  . ELECTROPHYSIOLOGIC STUDY N/A 08/04/2016   Procedure: Cardioversion;  Surgeon: Evans Lance, MD;  Location: [place] CV LAB;  Service: Cardiovascular;  Laterality: N/A;  . INSERT / REPLACE / REMOVE PACEMAKER  06/2007  . IR RADIOLOGY PERIPHERAL GUIDED IV START  01/31/2018  . IR US GUIDE VASC ACCESS LEFT  01/31/2018  . JOINT REPLACEMENT    . LAPAROSCOPIC CHOLECYSTECTOMY  1994  . LEFT AND RIGHT HEART CATHETERIZATION WITH CORONARY ANGIOGRAM N/A 10/06/2013   Procedure: LEFT AND RIGHT HEART CATHETERIZATION WITH CORONARY ANGIOGRAM;  Surgeon: Troy Sine, MD;  Location: [facility] CATH LAB;  Service: Cardiovascular;  Laterality: N/A;  . LEFT OOPHORECTOMY Left ~ 1989  . POSTERIOR LUMBAR FUSION  2000s - 04/2015 X 3   L3-4; L4-5; L2-3; Dr Trenton Gammon  . RIGHT/LEFT HEART CATH AND CORONARY ANGIOGRAPHY N/A 05/09/2017   Procedure: RIGHT/LEFT HEART CATH AND CORONARY ANGIOGRAPHY;  Surgeon: Sherren Mocha, MD;  Location: [place] CV LAB;  Service: Cardiovascular;  Laterality: N/A;  . SKIN CANCER EXCISION Right    [place]  . TEE WITHOUT CARDIOVERSION N/A 09/29/2016   Procedure: TRANSESOPHAGEAL ECHOCARDIOGRAM (TEE);  Surgeon: Jerline Pain, MD;  Location: [place];  Service: Cardiovascular;  Laterality: N/A;  . TOTAL ABDOMINAL HYSTERECTOMY  1984   "uterus & right ovary"  . TOTAL KNEE ARTHROPLASTY Left 04/23/2017  . TOTAL KNEE ARTHROPLASTY Left 04/23/2017   Procedure: TOTAL KNEE ARTHROPLASTY;  Surgeon: Frederik Pear, MD;  Location: [place];  Service: Orthopedics;  Laterality: Left;  . ULTRASOUND GUIDANCE FOR VASCULAR ACCESS  05/09/2017   Procedure: Ultrasound Guidance For Vascular Access;  Surgeon: Sherren Mocha, MD;  Location: [place] CV LAB;  Service: Cardiovascular;;     Current Outpatient Medications  Medication Sig Dispense Refill  . acetaminophen (TYLENOL) 500 MG tablet Take 500 mg by mouth every morning.     [name] amiodarone (PACERONE) 200 MG tablet Take 1 tablet (200 mg total) by mouth daily. 30 tablet 3  . Ascorbic Acid (VITAMIN C) 1000 MG tablet Take 1,000 mg by mouth daily.    [name] atorvastatin (LIPITOR) 40 MG tablet TAKE 1 TABLET BY MOUTH EVERY DAY 90 tablet 3  . Calcium Carbonate-Vitamin D (CALCIUM 600+D PO) Take 1 tablet by mouth daily.    . Cyanocobalamin (B-12) 5000 MCG CAPS Take 5,000 mcg by mouth daily.     [name] diltiazem (CARDIZEM CD) 120 MG 24 hr capsule TAKE 1 CAPSULE (120 MG TOTAL) BY MOUTH DAILY. 30 capsule 11  . diphenhydramine-acetaminophen (TYLENOL PM) 25-500 MG TABS tablet Take 1 tablet by mouth at bedtime as needed.     [name] ELIQUIS 5 MG TABS tablet TAKE 1 TABLET BY MOUTH TWICE A DAY 180 tablet 1  . FLUoxetine (PROZAC) 20 MG capsule  TAKE 1 CAPSULE BY MOUTH EVERY DAY IN THE MORNING 90 capsule 0  . fluticasone (FLONASE) 50 MCG/ACT nasal spray Place 1 spray into both nostrils at bedtime as needed for allergies.     [name] KLOR-CON M20 20 MEQ tablet TAKE 1 TABLET BY MOUTH TWICE A DAY 180 tablet 3  . loratadine (CLARITIN) 10 MG tablet TAKE 1 TABLET BY MOUTH EVERY DAY 90 tablet 1  . Lysine 1000 MG TABS Take 1,000 mg by mouth daily.    . Multiple Vitamin (MULTIVITAMIN WITH MINERALS) TABS tablet Take 1 tablet by mouth daily. Centrum    . nitroGLYCERIN (NITROSTAT) 0.4 MG SL tablet Place 1 tablet (0.4 mg total) under the tongue every 5 (five) minutes x 3 doses as needed for chest pain. 25 tablet 6  . Polyethyl Glycol-Propyl Glycol (SYSTANE) 0.4-0.3 % SOLN Place 1 drop into both eyes at bedtime as needed (for dry eyes).     . torsemide (DEMADEX) 20 MG tablet Take 2 tablets (40 mg total) by mouth 2 (two) times daily. 180 tablet 3  . valACYclovir (VALTREX) 1000 MG tablet TAKE 2 TABLETS BY  MOUTH AND REPEAT IN 12 HOURS FOR COLD SORES (Patient taking differently: Take 1,000 mg by mouth every 12 (twelve) hours as needed (cold sores). ) 30 tablet 1  . vitamin E 400 UNIT capsule Take 800 Units by mouth daily.      No current facility-administered medications for this visit.     Allergies:   Adhesive [tape]   Social History:  The patient  reports that she quit smoking about 35 years ago. Her smoking use included cigarettes. She started smoking about 40 years ago. She has a 5.00 pack-year smoking history. She has never used smokeless tobacco. She reports that she drinks alcohol. She reports that she does not use drugs.   Family History:  The patient's family history includes Arrhythmia in her mother; Diabetes in her mother and paternal grandfather; Heart attack (age of onset: 48) in her brother; Heart disease in her paternal aunt; Hypertension in her mother; Prostate cancer in her maternal grandfather; Suicidality in her father.   ROS:  Please see the history of present illness.   Otherwise, review of systems is positive for leg swelling, shortness of breath, palpitations, back pain.   All other systems are reviewed and negative.   PHYSICAL EXAM: VS:  BP 126/76   Pulse (!) 113   Ht 5' 5.5" (1.664 m)   Wt 253 lb (114.8 kg)   BMI 41.46 kg/m  , BMI Body mass index is 41.46 kg/m. GEN: Well nourished, well developed, in no acute distress  HEENT: normal  Neck: no JVD, carotid bruits, or masses Cardiac: iRRR; no murmurs, rubs, or gallops,no edema  Respiratory:  clear to auscultation bilaterally, normal work of breathing GI: soft, nontender, nondistended, + BS MS: no deformity or atrophy  Skin: warm and dry, device site well healed Neuro:  Strength and sensation are intact Psych: euthymic mood, full affect  EKG:  EKG is ordered today. Personal review of the ekg ordered shows atrial flutter, rate 113  Personal review of the device interrogation today. Results in [place]: 05/06/2018: ALT 26; BUN 22; Creatinine, Ser 1.22; Hemoglobin 14.8; Platelets 257.0; Potassium 4.7; Sodium 140; TSH 2.37    Lipid Panel     Component Value Date/Time   CHOL 152 05/06/2018 0923   CHOL 116 10/16/2016 0850   TRIG 118.0 05/06/2018 0923   HDL 59.50 05/06/2018 0923  HDL 42 10/16/2016 0850   CHOLHDL 3 05/06/2018 0923   VLDL 23.6 05/06/2018 0923   LDLCALC 69 05/06/2018 0923   LDLCALC 46 10/16/2016 0850     Wt Readings from Last 3 Encounters:  05/14/18 253 lb (114.8 kg)  05/06/18 254 lb 8 oz (115.4 kg)  02/26/18 247 lb (112 kg)      Other studies Reviewed: Additional studies/ records that were reviewed today include: TTE 03/28/16  Review of the above records today demonstrates:  - Left ventricle: The cavity size was normal. Wall thickness was   increased in a pattern of mild LVH. Systolic function was normal.   The estimated ejection fraction was in the range of 55% to 60%.   Indeterminant diastolic function (atrial fibrillation). Wall   motion was normal; there were no regional wall motion   abnormalities. - Aortic valve: There was no stenosis. - Mitral valve: Mildly calcified annulus. There was no significant   regurgitation. - Left atrium: The atrium was mildly dilated. - Right ventricle: The cavity size was normal. Pacer wire or   catheter noted in right ventricle. Systolic function was normal. - Tricuspid valve: Peak RV-RA gradient (S): 22 mm Hg. - Pulmonary arteries: PA peak pressure: 30 mm Hg (S). - Systemic veins: IVC measured 2.3 cm with normal respirophasic   variation, suggesting RA pressure 8 mmHg.  LHC/RHC 05/09/17 1.  Single-vessel coronary artery disease with continued patency of the stented segment in the right coronary artery and chronic occlusion of a small acute marginal branch collateralized by the left coronary artery 2.  Patent left main, LAD, and left circumflex without significant stenosis 3.  Vigorous LV systolic function with  mildly elevated LVEDP 4.  Severe pulmonary hypertension with mean pulmonary artery pressure of 48 mmHg.  Because of high cardiac output (8.5 L/min), the patient's pulmonary vascular resistance is only mildly increased at 3 Wood units.  ASSESSMENT AND PLAN:  1.  Persistent atrial fibrillation: Atrial fibrillation ablation 09/29/2016 with repeat ablation on 02/07/2018.  Unfortunately she has gone back into both atrial fibrillation and has had an atypical atrial flutter.  We Merril Isakson thus plan for cardioversion.  She is currently on amiodarone and has not missed any doses of her anticoagulation.  I talked to her about the possibility of ablating her atypical atrial flutter.  She would like to wait until after the holidays.  Marijke Guadiana increase diltiazem to 240 mg.  This patients CHA2DS2-VASc Score and unadjusted Ischemic Stroke Rate (% per year) is equal to 3.2 % stroke rate/year from a score of 3  Above score calculated as 1 point each if present [CHF, HTN, DM, Vascular=MI/PAD/Aortic Plaque, Age if 65-74, or Female] Above score calculated as 2 points each if present [Age > 75, or Stroke/TIA/TE]   2. Hypertension: Currently well controlled  3. Obesity: Weight loss encouraged  4. Chronic diastolic heart failure: No current volume overload  5. Obstructive sleep apnea: CPAP compliance encouraged  6.  Sinus node dysfunction: Status post Saint Jude dual-chamber pacemaker functioning appropriately  Current medicines are reviewed at length with the patient today.   The patient does not have concerns regarding her medicines.  The following changes were made today: Increase diltiazem  Labs/ tests ordered today include:  Orders Placed This Encounter  Procedures  . EKG 12-Lead     Disposition:   FU with Geoff Dacanay to months  Signed, Abbagale Goguen Meredith Leeds, MD  05/14/2018 10:46 AM     [address]  [address] [place] [address] [phone] (office) [phone] (fax)

## 2018-05-20 ENCOUNTER — Ambulatory Visit (HOSPITAL_COMMUNITY)
Admission: RE | Admit: 2018-05-20 | Discharge: 2018-05-20 | Disposition: A | Discharge status: Home / Self Care | Payer: PPO | Source: Ambulatory Visit | Attending: Cardiology | Admitting: Cardiology

## 2018-05-20 ENCOUNTER — Other Ambulatory Visit: Payer: Self-pay

## 2018-05-20 ENCOUNTER — Ambulatory Visit (HOSPITAL_COMMUNITY): Payer: PPO | Admitting: Anesthesiology

## 2018-05-20 ENCOUNTER — Encounter (HOSPITAL_COMMUNITY)
Admission: RE | Disposition: A | Discharge status: Home / Self Care | Payer: Self-pay | Source: Ambulatory Visit | Attending: Cardiology

## 2018-05-20 DIAGNOSIS — Z90710 Acquired absence of both cervix and uterus: Secondary | ICD-10-CM | POA: Insufficient documentation

## 2018-05-20 DIAGNOSIS — Z955 Presence of coronary angioplasty implant and graft: Secondary | ICD-10-CM | POA: Insufficient documentation

## 2018-05-20 DIAGNOSIS — I130 Hypertensive heart and chronic kidney disease with heart failure and stage 1 through stage 4 chronic kidney disease, or unspecified chronic kidney disease: Secondary | ICD-10-CM | POA: Insufficient documentation

## 2018-05-20 DIAGNOSIS — Z9049 Acquired absence of other specified parts of digestive tract: Secondary | ICD-10-CM | POA: Diagnosis not present

## 2018-05-20 DIAGNOSIS — Z950 Presence of cardiac pacemaker: Secondary | ICD-10-CM | POA: Insufficient documentation

## 2018-05-20 DIAGNOSIS — Z90721 Acquired absence of ovaries, unilateral: Secondary | ICD-10-CM | POA: Insufficient documentation

## 2018-05-20 DIAGNOSIS — E669 Obesity, unspecified: Secondary | ICD-10-CM | POA: Insufficient documentation

## 2018-05-20 DIAGNOSIS — I484 Atypical atrial flutter: Secondary | ICD-10-CM | POA: Diagnosis not present

## 2018-05-20 DIAGNOSIS — Z8249 Family history of ischemic heart disease and other diseases of the circulatory system: Secondary | ICD-10-CM | POA: Insufficient documentation

## 2018-05-20 DIAGNOSIS — E1122 Type 2 diabetes mellitus with diabetic chronic kidney disease: Secondary | ICD-10-CM | POA: Diagnosis not present

## 2018-05-20 DIAGNOSIS — I252 Old myocardial infarction: Secondary | ICD-10-CM | POA: Diagnosis not present

## 2018-05-20 DIAGNOSIS — Z7901 Long term (current) use of anticoagulants: Secondary | ICD-10-CM | POA: Insufficient documentation

## 2018-05-20 DIAGNOSIS — N189 Chronic kidney disease, unspecified: Secondary | ICD-10-CM | POA: Insufficient documentation

## 2018-05-20 DIAGNOSIS — F329 Major depressive disorder, single episode, unspecified: Secondary | ICD-10-CM | POA: Insufficient documentation

## 2018-05-20 DIAGNOSIS — Z85828 Personal history of other malignant neoplasm of skin: Secondary | ICD-10-CM | POA: Insufficient documentation

## 2018-05-20 DIAGNOSIS — Z79899 Other long term (current) drug therapy: Secondary | ICD-10-CM | POA: Insufficient documentation

## 2018-05-20 DIAGNOSIS — G4733 Obstructive sleep apnea (adult) (pediatric): Secondary | ICD-10-CM | POA: Insufficient documentation

## 2018-05-20 DIAGNOSIS — E785 Hyperlipidemia, unspecified: Secondary | ICD-10-CM | POA: Insufficient documentation

## 2018-05-20 DIAGNOSIS — Z888 Allergy status to other drugs, medicaments and biological substances status: Secondary | ICD-10-CM | POA: Insufficient documentation

## 2018-05-20 DIAGNOSIS — I110 Hypertensive heart disease with heart failure: Secondary | ICD-10-CM | POA: Diagnosis not present

## 2018-05-20 DIAGNOSIS — Z7951 Long term (current) use of inhaled steroids: Secondary | ICD-10-CM | POA: Insufficient documentation

## 2018-05-20 DIAGNOSIS — Z981 Arthrodesis status: Secondary | ICD-10-CM | POA: Insufficient documentation

## 2018-05-20 DIAGNOSIS — I2510 Atherosclerotic heart disease of native coronary artery without angina pectoris: Secondary | ICD-10-CM | POA: Insufficient documentation

## 2018-05-20 DIAGNOSIS — I5033 Acute on chronic diastolic (congestive) heart failure: Secondary | ICD-10-CM | POA: Diagnosis not present

## 2018-05-20 DIAGNOSIS — Z87891 Personal history of nicotine dependence: Secondary | ICD-10-CM | POA: Diagnosis not present

## 2018-05-20 DIAGNOSIS — I272 Pulmonary hypertension, unspecified: Secondary | ICD-10-CM | POA: Insufficient documentation

## 2018-05-20 DIAGNOSIS — Z833 Family history of diabetes mellitus: Secondary | ICD-10-CM | POA: Insufficient documentation

## 2018-05-20 DIAGNOSIS — I495 Sick sinus syndrome: Secondary | ICD-10-CM | POA: Diagnosis not present

## 2018-05-20 DIAGNOSIS — Z9841 Cataract extraction status, right eye: Secondary | ICD-10-CM | POA: Insufficient documentation

## 2018-05-20 DIAGNOSIS — Z8042 Family history of malignant neoplasm of prostate: Secondary | ICD-10-CM | POA: Insufficient documentation

## 2018-05-20 DIAGNOSIS — I5032 Chronic diastolic (congestive) heart failure: Secondary | ICD-10-CM | POA: Insufficient documentation

## 2018-05-20 DIAGNOSIS — F419 Anxiety disorder, unspecified: Secondary | ICD-10-CM | POA: Diagnosis not present

## 2018-05-20 DIAGNOSIS — I4891 Unspecified atrial fibrillation: Secondary | ICD-10-CM | POA: Diagnosis not present

## 2018-05-20 DIAGNOSIS — Z8673 Personal history of transient ischemic attack (TIA), and cerebral infarction without residual deficits: Secondary | ICD-10-CM | POA: Insufficient documentation

## 2018-05-20 DIAGNOSIS — Z96652 Presence of left artificial knee joint: Secondary | ICD-10-CM | POA: Insufficient documentation

## 2018-05-20 DIAGNOSIS — Z9842 Cataract extraction status, left eye: Secondary | ICD-10-CM | POA: Insufficient documentation

## 2018-05-20 DIAGNOSIS — I4819 Other persistent atrial fibrillation: Secondary | ICD-10-CM | POA: Insufficient documentation

## 2018-05-20 DIAGNOSIS — Z6841 Body Mass Index (BMI) 40.0 and over, adult: Secondary | ICD-10-CM | POA: Insufficient documentation

## 2018-05-20 DIAGNOSIS — Z9889 Other specified postprocedural states: Secondary | ICD-10-CM | POA: Insufficient documentation

## 2018-05-20 HISTORY — PX: CARDIOVERSION: SHX1299

## 2018-05-20 SURGERY — CARDIOVERSION
Anesthesia: General

## 2018-05-20 MED ORDER — PROPOFOL 10 MG/ML IV BOLUS
INTRAVENOUS | Status: DC | PRN
  Administered 2018-05-20: 70 mg via INTRAVENOUS
  Administered 2018-05-20: 30 mg via INTRAVENOUS

## 2018-05-20 MED ORDER — VALACYCLOVIR HCL 1 G PO TABS: 1000.0000 mg | ORAL_TABLET | Freq: Two times a day (BID) | ORAL | Status: DC | PRN

## 2018-05-20 MED ORDER — LIDOCAINE 2% (20 MG/ML) 5 ML SYRINGE
INTRAMUSCULAR | Status: DC | PRN
  Administered 2018-05-20: 100 mg via INTRAVENOUS

## 2018-05-20 NOTE — CV Procedure (Signed)
   Electrical Cardioversion Procedure Note BASILIA STUCKERT [id number] [birth year]  Procedure: Electrical Cardioversion Indications:  Atrial Fibrillation  Time Out: Verified patient identification, verified procedure,medications/allergies/relevent history reviewed, required imaging and test results available.  Performed  Procedure Details  The patient was NPO after midnight. Anesthesia was administered at the beside  by [facility] with 100mg  of propofol and 100mg  of Lidocaine.  Cardioversion was done with synchronized biphasic defibrillation with AP pads with 150watts.  The patient converted to normal sinus rhythm. The patient tolerated the procedure well   IMPRESSION:  Successful cardioversion of atrial fibrillation    Traci Turner 05/20/2018, 10:54 AM

## 2018-05-20 NOTE — Anesthesia Postprocedure Evaluation (Signed)
Anesthesia Post Note  Patient: [name]  Procedure(s) Performed: CARDIOVERSION (N/A )     Patient location during evaluation: PACU Anesthesia Type: General Level of consciousness: awake and alert Pain management: pain level controlled Vital Signs Assessment: post-procedure vital signs reviewed and stable Respiratory status: spontaneous breathing, nonlabored ventilation, respiratory function stable and patient connected to nasal cannula oxygen Cardiovascular status: blood pressure returned to baseline and stable Postop Assessment: no apparent nausea or vomiting Anesthetic complications: no    Last Vitals:  Vitals:   05/20/18 1140 05/20/18 1150  BP: 123/63 (!) 143/70  Pulse: 76 78  Resp: (!) 24 (!) 26  Temp:    SpO2: 92% 91%    Last Pain:  Vitals:   05/20/18 1150  TempSrc:   PainSc: 0-No pain                 Dyan Creelman DAVID

## 2018-05-20 NOTE — Discharge Instructions (Signed)
Electrical Cardioversion, Care After °This sheet gives you information about how to care for yourself after your procedure. Your health care provider may also give you more specific instructions. If you have problems or questions, contact your health care provider. °What can I expect after the procedure? °After the procedure, it is common to have: °· Some redness on the skin where the shocks were given. ° °Follow these instructions at home: °· Do not drive for 24 hours if you were given a medicine to help you relax (sedative). °· Take over-the-counter and prescription medicines only as told by your health care provider. °· Ask your health care provider how to check your pulse. Check it often. °· Rest for 48 hours after the procedure or as told by your health care provider. °· Avoid or limit your caffeine use as told by your health care provider. °Contact a health care provider if: °· You feel like your heart is beating too quickly or your pulse is not regular. °· You have a serious muscle cramp that does not go away. °Get help right away if: °· You have discomfort in your chest. °· You are dizzy or you feel faint. °· You have trouble breathing or you are short of breath. °· Your speech is slurred. °· You have trouble moving an arm or leg on one side of your body. °· Your fingers or toes turn cold or blue. °This information is not intended to replace advice given to you by your health care provider. Make sure you discuss any questions you have with your health care provider. °Document Released: 04/09/2013 Document Revised: 01/21/2016 Document Reviewed: 12/24/2015 °Elsevier Interactive Patient Education © 2018 Elsevier Inc. ° °

## 2018-05-20 NOTE — Transfer of Care (Signed)
Immediate Anesthesia Transfer of Care Note  Patient: [name]  Procedure(s) Performed: CARDIOVERSION (N/A )  Patient Location: Endoscopy Unit  Anesthesia Type:General  Level of Consciousness: drowsy and patient cooperative  Airway & Oxygen Therapy: Patient Spontanous Breathing  Post-op Assessment: Report given to RN and Post -op Vital signs reviewed and stable  Post vital signs: Reviewed and stable  Last Vitals:  Vitals Value Taken Time  BP 116/54 05/20/2018 11:27 AM  Temp    Pulse 74 05/20/2018 11:30 AM  Resp 21 05/20/2018 11:30 AM  SpO2 89 % 05/20/2018 11:30 AM    Last Pain:  Vitals:   05/20/18 1013  TempSrc: Oral  PainSc: 2          Complications: No apparent anesthesia complications

## 2018-05-20 NOTE — Anesthesia Procedure Notes (Signed)
Procedure Name: General with mask airway Date/Time: 05/20/2018 11:24 AM Performed by: Lance Coon, CRNA Pre-anesthesia Checklist: Patient identified, Emergency Drugs available, Suction available, Patient being monitored and Timeout performed Patient Re-evaluated:Patient Re-evaluated prior to induction Oxygen Delivery Method: Ambu bag Preoxygenation: Pre-oxygenation with 100% oxygen Induction Type: IV induction Ventilation: Mask ventilation without difficulty

## 2018-05-20 NOTE — Interval H&P Note (Signed)
History and Physical Interval Note:  05/20/2018 10:54 AM  [name]  has presented today for surgery, with the diagnosis of ATRIAL FIBRILLATION  The various methods of treatment have been discussed with the patient and family. After consideration of risks, benefits and other options for treatment, the patient has consented to  Procedure(s): CARDIOVERSION (N/A) as a surgical intervention .  The patient's history has been reviewed, patient examined, no change in status, stable for surgery.  I have reviewed the patient's chart and labs.  Questions were answered to the patient's satisfaction.     Fransico Him

## 2018-05-20 NOTE — Anesthesia Preprocedure Evaluation (Addendum)
Anesthesia Evaluation  Patient identified by MRN, date of birth, ID band Patient awake    Reviewed: Allergy & Precautions, NPO status , Patient's Chart, lab work & pertinent test results  History of Anesthesia Complications Negative for: history of anesthetic complications  Airway Mallampati: I  TM Distance: >3 FB Neck ROM: Full    Dental   Pulmonary sleep apnea and Continuous Positive Airway Pressure Ventilation , former smoker,    Pulmonary exam normal        Cardiovascular hypertension, Pt. on medications + CAD, + Past MI, + Cardiac Stents and +CHF  Normal cardiovascular exam+ dysrhythmias Atrial Fibrillation + pacemaker    '18 Cath - Single-vessel coronary artery disease with continued patency of the stented segment in the right coronary artery and chronic occlusion of a small acute marginal branch collateralized by the left coronary artery 2.  Patent left main, LAD, and left circumflex without significant stenosis 3.  Vigorous LV systolic function with mildly elevated LVEDP 4.  Severe pulmonary hypertension with mean pulmonary artery pressure of 48 mmHg.  Because of high cardiac output (8.5 L/min), the patient's pulmonary vascular resistance is only mildly increased at 3 Wood units.  '18 TTE - Moderate concentric LVH. EF 55% to 60%. Grade 2 diastolic dysfunction. Mild MR. Left atrium was moderately dilated. PA peak pressure: 53 mm Hg    Neuro/Psych PSYCHIATRIC DISORDERS Anxiety Depression  Diplopia  TIACVA, No Residual Symptoms    GI/Hepatic negative GI ROS, Neg liver ROS,   Endo/Other  diabetesMorbid obesity  Renal/GU CRFRenal disease     Musculoskeletal  (+) Arthritis ,   Abdominal   Peds  Hematology negative hematology ROS (+)   Anesthesia Other Findings   Reproductive/Obstetrics                           Anesthesia Physical Anesthesia Plan  ASA: IV  Anesthesia Plan: General    Post-op Pain Management:    Induction: Intravenous  PONV Risk Score and Plan: 3 and Treatment may vary due to age or medical condition and Propofol infusion  Airway Management Planned: Mask and Natural Airway  Additional Equipment: None  Intra-op Plan:   Post-operative Plan:   Informed Consent: I have reviewed the patients History and Physical, chart, labs and discussed the procedure including the risks, benefits and alternatives for the proposed anesthesia with the patient or authorized representative who has indicated his/her understanding and acceptance.     Plan Discussed with: CRNA and Surgeon  Anesthesia Plan Comments:        Anesthesia Quick Evaluation

## 2018-05-21 ENCOUNTER — Other Ambulatory Visit: Payer: Self-pay | Admitting: Internal Medicine

## 2018-05-21 ENCOUNTER — Telehealth: Payer: Self-pay | Admitting: Cardiology

## 2018-05-21 DIAGNOSIS — R748 Abnormal levels of other serum enzymes: Secondary | ICD-10-CM

## 2018-05-21 NOTE — Telephone Encounter (Signed)
Pt advised to stop Amiodarone.  Informed her that it was stopped 04/2017 d/t abn CT findings.  Pt is agreeable to plan.  Inquired as to why Tikosyn was stopped back in 2017.  Pt is unsure why.  Pt informed that I would look into this and discuss w/ Dr. Curt Bears. Pt made aware that options are becoming very limited.  She understands I will call her soon with recommendation/s.

## 2018-05-21 NOTE — Telephone Encounter (Signed)
New message   Patient states that she had a cardio conversion on 05/21/2018. Patient states that heart went back out of rhythm last night. Please call to discuss.

## 2018-05-22 ENCOUNTER — Encounter (HOSPITAL_COMMUNITY): Payer: Self-pay | Admitting: Cardiology

## 2018-05-23 NOTE — Telephone Encounter (Signed)
Informed pt that options are to re-start Tikosyn to try and see if it would work. Also informed another ablation is an option also.  Pt states she is back in rhythm. Pt will think about options. Pt and I agreed to speak in the next few weeks to see if she would like either. Pt will call the office if she begins to experience issues/symptoms w/ her AFib. She appreciates my call.

## 2018-05-25 ENCOUNTER — Other Ambulatory Visit: Payer: Self-pay | Admitting: Internal Medicine

## 2018-05-28 ENCOUNTER — Ambulatory Visit (INDEPENDENT_AMBULATORY_CARE_PROVIDER_SITE_OTHER): Payer: PPO

## 2018-05-28 ENCOUNTER — Ambulatory Visit (INDEPENDENT_AMBULATORY_CARE_PROVIDER_SITE_OTHER): Payer: PPO | Admitting: Family Medicine

## 2018-05-28 ENCOUNTER — Encounter: Payer: Self-pay | Admitting: Family Medicine

## 2018-05-28 VITALS — BP 128/60 | Temp 99.3°F | Wt 255.0 lb

## 2018-05-28 DIAGNOSIS — J029 Acute pharyngitis, unspecified: Secondary | ICD-10-CM

## 2018-05-28 DIAGNOSIS — R0902 Hypoxemia: Secondary | ICD-10-CM

## 2018-05-28 DIAGNOSIS — R0602 Shortness of breath: Secondary | ICD-10-CM | POA: Diagnosis not present

## 2018-05-28 MED ORDER — LEVOFLOXACIN 500 MG PO TABS: 500.0000 mg | ORAL_TABLET | Freq: Every day | ORAL | 0 refills | Status: DC

## 2018-05-28 MED ORDER — METHYLPREDNISOLONE ACETATE 80 MG/ML IJ SUSP
80.0000 mg | Freq: Once | INTRAMUSCULAR | Status: AC
  Administered 2018-05-28: 80 mg via INTRAMUSCULAR

## 2018-05-28 MED ORDER — ALBUTEROL SULFATE HFA 108 (90 BASE) MCG/ACT IN AERS: 2.0000 | INHALATION_SPRAY | RESPIRATORY_TRACT | 0 refills | Status: DC | PRN

## 2018-05-28 MED ORDER — ALBUTEROL SULFATE (2.5 MG/3ML) 0.083% IN NEBU
2.5000 mg | INHALATION_SOLUTION | RESPIRATORY_TRACT | Status: AC
  Administered 2018-05-28: 2.5 mg via RESPIRATORY_TRACT

## 2018-05-28 NOTE — Patient Instructions (Signed)
Follow up immediately for any fever, increased shortness of breath, or other concerns Start the antibiotic tonight

## 2018-05-28 NOTE — Progress Notes (Addendum)
Subjective:     Patient ID: [name], female   DOB: 06/07/1946, 72 y.o.   MRN: [id number]  HPI Patient seen with chief complaint of sore throat.  She started about 4 days ago with some rhinitis followed by a sore throat and now cough with some shortness of breath.  Quit smoking 1984.  She has history of diastolic heart failure and reported pulmonary hypertension.  Also hx of OSA.  She is on chronic anticoagulation with Eliquis for atrial fibrillation.  Denies any recent fever or chills.  She also has history of CAD.  She denies any recent chest pains.  She has history of diastolic heart failure and takes high-dose Demadex 20 mg 2 tablets twice daily at baseline.  No recent orthopnea.  No pleuritic pain Weight unchanged from 8 days ago.  Had very similar episode one year ago and treated with antibiotics and improved.  Wt Readings from Last 3 Encounters:  05/28/18 255 lb (115.7 kg)  05/20/18 255 lb (115.7 kg)  05/14/18 253 lb (114.8 kg)     Past Medical History:  Diagnosis Date  . Anticoagulant long-term use    pradaxa  . Anxiety   . Arthritis    "fingers, lower back" (04/23/2017   . CAD (coronary artery disease) 6759,1638   post PTCA with bare-metal stenting to mid RCA in December 2004     . CHF (congestive heart failure) ([place])   . Chronic atrial fibrillation 06/2007   Tachybradycardia pacemaker  . Chronic kidney disease    10% function - ?R, other kidney is compensating    . CVA (cerebral vascular accident) [facility]) [id number]   denies residual on 04/23/2017  . Depression   . Diplopia 06/19/2008   Qualifier: Diagnosis of  By: [name] MD, [name]   . Dysrhythmia    ATRIAL FIBRILATION  . Edema of lower extremity   . Hyperlipidemia   . Hypertension   . Inferior myocardial infarction [facility])    acute inferior wall mi/other medical hx  . Myocardial infarction ([place]) [id number]  . OSA on CPAP    last test- 2010  . Pacemaker   . Pneumonia 2014   tx. ----  [facility]  .  Pulmonary hypertension ([place])    moderate pulmonary hypertension by 10/2016 echo and 10/2013 cardiac cath  . Shortness of breath   . Skin cancer    "cut off right [place]; burned off LLE" (04/23/2017)  . Spondylolisthesis   . TIA (transient ischemic attack) 2008  . Unspecified hemorrhoids without mention of complication 01/01/7792   Colonoscopy--Dr. Carlean Purl    Past Surgical History:  Procedure Laterality Date  . APPENDECTOMY  1984  . ATRIAL FIBRILLATION ABLATION N/A 09/29/2016   Procedure: Atrial Fibrillation Ablation;  Surgeon: Will Meredith Leeds, MD;  Location: [place] CV LAB;  Service: Cardiovascular;  Laterality: N/A;  . ATRIAL FIBRILLATION ABLATION N/A 02/07/2018   Procedure: ATRIAL FIBRILLATION ABLATION;  Surgeon: Constance Haw, MD;  Location: [place] CV LAB;  Service: Cardiovascular;  Laterality: N/A;  . BACK SURGERY    . CARDIOVERSION N/A 09/12/2017   Procedure: CARDIOVERSION;  Surgeon: Jerline Pain, MD;  Location[facility] ENDOSCOPY;  Service: Cardiovascular;  Laterality: N/A;  . CARDIOVERSION N/A 12/13/2017   Procedure: CARDIOVERSION;  Surgeon: Sanda Klein, MD;  Location: [place] ENDOSCOPY;  Service: Cardiovascular;  Laterality: N/A;  . CARDIOVERSION N/A 02/18/2018   Procedure: CARDIOVERSION;  Surgeon: Dorothy Spark, MD;  Location: [place];  Service: Cardiovascular;  Laterality: N/A;  .  CARDIOVERSION N/A 05/20/2018   Procedure: CARDIOVERSION;  Surgeon: [name], MD;  Location: [facility] ENDOSCOPY;  Service: Cardiovascular;  Laterality: N/A;  . CATARACT EXTRACTION W/ INTRAOCULAR LENS  IMPLANT, BILATERAL Bilateral 01/15/2017- 03/2017  . CORONARY ANGIOPLASTY  X 2  . CORONARY ANGIOPLASTY WITH STENT PLACEMENT  1998; ~ 2007; ?date   "1 stent; replaced stent; not sure when I got the last stent" (04/23/2017)  . DOPPLER ECHOCARDIOGRAPHY  2009  . ELECTROPHYSIOLOGIC STUDY N/A 03/31/2016   Procedure: Cardioversion;  Surgeon: Evans Lance, MD;  Location: [place] CV LAB;   Service: Cardiovascular;  Laterality: N/A;  . ELECTROPHYSIOLOGIC STUDY N/A 08/04/2016   Procedure: Cardioversion;  Surgeon: Evans Lance, MD;  Location: [place] CV LAB;  Service: Cardiovascular;  Laterality: N/A;  . INSERT / REPLACE / REMOVE PACEMAKER  06/2007  . IR RADIOLOGY PERIPHERAL GUIDED IV START  01/31/2018  . IR US GUIDE VASC ACCESS LEFT  01/31/2018  . JOINT REPLACEMENT    . LAPAROSCOPIC CHOLECYSTECTOMY  1994  . LEFT AND RIGHT HEART CATHETERIZATION WITH CORONARY ANGIOGRAM N/A 10/06/2013   Procedure: LEFT AND RIGHT HEART CATHETERIZATION WITH CORONARY ANGIOGRAM;  Surgeon: Troy Sine, MD;  Location: [facility] CATH LAB;  Service: Cardiovascular;  Laterality: N/A;  . LEFT OOPHORECTOMY Left ~ 1989  . POSTERIOR LUMBAR FUSION  2000s - 04/2015 X 3   L3-4; L4-5; L2-3; Dr Trenton Gammon  . RIGHT/LEFT HEART CATH AND CORONARY ANGIOGRAPHY N/A 05/09/2017   Procedure: RIGHT/LEFT HEART CATH AND CORONARY ANGIOGRAPHY;  Surgeon: Sherren Mocha, MD;  Location: [place] CV LAB;  Service: Cardiovascular;  Laterality: N/A;  . SKIN CANCER EXCISION Right    [place]  . TEE WITHOUT CARDIOVERSION N/A 09/29/2016   Procedure: TRANSESOPHAGEAL ECHOCARDIOGRAM (TEE);  Surgeon: Jerline Pain, MD;  Location: [place];  Service: Cardiovascular;  Laterality: N/A;  . TOTAL ABDOMINAL HYSTERECTOMY  1984   "uterus & right ovary"  . TOTAL KNEE ARTHROPLASTY Left 04/23/2017  . TOTAL KNEE ARTHROPLASTY Left 04/23/2017   Procedure: TOTAL KNEE ARTHROPLASTY;  Surgeon: Frederik Pear, MD;  Location: [place];  Service: Orthopedics;  Laterality: Left;  . ULTRASOUND GUIDANCE FOR VASCULAR ACCESS  05/09/2017   Procedure: Ultrasound Guidance For Vascular Access;  Surgeon: Sherren Mocha, MD;  Location: [place] CV LAB;  Service: Cardiovascular;;    reports that she quit smoking about 35 years ago. Her smoking use included cigarettes. She started smoking about 40 years ago. She has a 5.00 pack-year smoking history. She has never used smokeless  tobacco. She reports that she drinks alcohol. She reports that she does not use drugs. family history includes Arrhythmia in her mother; Diabetes in her mother and paternal grandfather; Heart attack (age of onset: 74) in her brother; Heart disease in her paternal aunt; Hypertension in her mother; Prostate cancer in her maternal grandfather; Suicidality in her father. Allergies  Allergen Reactions  . Adhesive [Tape] Rash and Other (See Comments)    Allergic to EKG stickers and defibrillation pads.     Review of Systems  Constitutional: Positive for fatigue. Negative for chills and fever.  HENT: Positive for congestion, postnasal drip and sore throat. Negative for sinus pressure.   Respiratory: Positive for cough and shortness of breath.   Cardiovascular: Negative for chest pain and leg swelling.  Gastrointestinal: Negative for abdominal pain.  Genitourinary: Negative for dysuria.       Objective:   Physical Exam  Constitutional: She appears well-developed and well-nourished.  Neck: Neck supple.  Pulmonary/Chest:  Patient  has some diffuse expiratory wheezes.  No retractions.  Pulse oximetry is varying between 88 and 93%.  Musculoskeletal:  No pitting edema.  Lymphadenopathy:    She has no cervical adenopathy.  Neurological: She is alert.       Assessment:     Patient has multiple medical problems as above and presents with 4-day history of upper respiratory symptoms progressing now with cough and some dyspnea with exertion.  She has reported pulmonary hypertension and history of diastolic heart failure.    Plan:     -Obtain CXR -Nebulizer with albuterol given.  She did have some productive cough afterwards and pulse oximetry up to 93% -Start Levaquin 500 mg once daily for 7 days -Continue Demadex. -Depo-Medrol 80 mg IM given -Albuterol MDI 2 puffs every 4-6 hours as needed for wheezing -We discussed the fact we may need to consider hospitalization if she does not have any  prompt improvement over the next couple days  Eulas Post MD [facility]  05-29-18  Spoke with pt this AM and she feels better- less dyspnea and O 2 up in low 90 range. No fever.  She is taking the antibiotic and using albuterol as needed.  She will follow up for any increased shortness of breath or other concerns.  Eulas Post MD [facility] at [facility]

## 2018-05-31 ENCOUNTER — Emergency Department (HOSPITAL_COMMUNITY): Payer: PPO

## 2018-05-31 ENCOUNTER — Other Ambulatory Visit: Payer: Self-pay

## 2018-05-31 ENCOUNTER — Inpatient Hospital Stay (HOSPITAL_COMMUNITY)
Admission: EM | Admit: 2018-05-31 | Discharge: 2018-06-02 | DRG: 177 | Disposition: A | Discharge status: Home / Self Care | Payer: PPO | Attending: Family Medicine | Admitting: Family Medicine

## 2018-05-31 ENCOUNTER — Encounter (HOSPITAL_COMMUNITY): Payer: Self-pay | Admitting: Emergency Medicine

## 2018-05-31 DIAGNOSIS — R590 Localized enlarged lymph nodes: Secondary | ICD-10-CM | POA: Diagnosis not present

## 2018-05-31 DIAGNOSIS — J40 Bronchitis, not specified as acute or chronic: Secondary | ICD-10-CM | POA: Diagnosis present

## 2018-05-31 DIAGNOSIS — G4733 Obstructive sleep apnea (adult) (pediatric): Secondary | ICD-10-CM | POA: Diagnosis present

## 2018-05-31 DIAGNOSIS — I4892 Unspecified atrial flutter: Secondary | ICD-10-CM | POA: Diagnosis present

## 2018-05-31 DIAGNOSIS — I2510 Atherosclerotic heart disease of native coronary artery without angina pectoris: Secondary | ICD-10-CM

## 2018-05-31 DIAGNOSIS — Z87891 Personal history of nicotine dependence: Secondary | ICD-10-CM

## 2018-05-31 DIAGNOSIS — I509 Heart failure, unspecified: Secondary | ICD-10-CM

## 2018-05-31 DIAGNOSIS — Z85828 Personal history of other malignant neoplasm of skin: Secondary | ICD-10-CM

## 2018-05-31 DIAGNOSIS — H532 Diplopia: Secondary | ICD-10-CM | POA: Diagnosis present

## 2018-05-31 DIAGNOSIS — E119 Type 2 diabetes mellitus without complications: Secondary | ICD-10-CM | POA: Diagnosis not present

## 2018-05-31 DIAGNOSIS — Z950 Presence of cardiac pacemaker: Secondary | ICD-10-CM | POA: Diagnosis not present

## 2018-05-31 DIAGNOSIS — J1008 Influenza due to other identified influenza virus with other specified pneumonia: Secondary | ICD-10-CM | POA: Diagnosis not present

## 2018-05-31 DIAGNOSIS — J449 Chronic obstructive pulmonary disease, unspecified: Secondary | ICD-10-CM | POA: Diagnosis not present

## 2018-05-31 DIAGNOSIS — I130 Hypertensive heart and chronic kidney disease with heart failure and stage 1 through stage 4 chronic kidney disease, or unspecified chronic kidney disease: Secondary | ICD-10-CM | POA: Diagnosis present

## 2018-05-31 DIAGNOSIS — I495 Sick sinus syndrome: Secondary | ICD-10-CM | POA: Diagnosis present

## 2018-05-31 DIAGNOSIS — Z6841 Body Mass Index (BMI) 40.0 and over, adult: Secondary | ICD-10-CM

## 2018-05-31 DIAGNOSIS — J9601 Acute respiratory failure with hypoxia: Secondary | ICD-10-CM | POA: Diagnosis not present

## 2018-05-31 DIAGNOSIS — I5033 Acute on chronic diastolic (congestive) heart failure: Secondary | ICD-10-CM | POA: Diagnosis present

## 2018-05-31 DIAGNOSIS — I110 Hypertensive heart disease with heart failure: Secondary | ICD-10-CM | POA: Diagnosis not present

## 2018-05-31 DIAGNOSIS — Z8701 Personal history of pneumonia (recurrent): Secondary | ICD-10-CM

## 2018-05-31 DIAGNOSIS — Z66 Do not resuscitate: Secondary | ICD-10-CM | POA: Diagnosis present

## 2018-05-31 DIAGNOSIS — Z7901 Long term (current) use of anticoagulants: Secondary | ICD-10-CM

## 2018-05-31 DIAGNOSIS — J158 Pneumonia due to other specified bacteria: Secondary | ICD-10-CM | POA: Diagnosis not present

## 2018-05-31 DIAGNOSIS — I482 Chronic atrial fibrillation, unspecified: Secondary | ICD-10-CM | POA: Diagnosis not present

## 2018-05-31 DIAGNOSIS — I272 Pulmonary hypertension, unspecified: Secondary | ICD-10-CM | POA: Diagnosis present

## 2018-05-31 DIAGNOSIS — K760 Fatty (change of) liver, not elsewhere classified: Secondary | ICD-10-CM | POA: Diagnosis not present

## 2018-05-31 DIAGNOSIS — F329 Major depressive disorder, single episode, unspecified: Secondary | ICD-10-CM | POA: Diagnosis present

## 2018-05-31 DIAGNOSIS — Z79899 Other long term (current) drug therapy: Secondary | ICD-10-CM

## 2018-05-31 DIAGNOSIS — Z8673 Personal history of transient ischemic attack (TIA), and cerebral infarction without residual deficits: Secondary | ICD-10-CM

## 2018-05-31 DIAGNOSIS — Z9861 Coronary angioplasty status: Secondary | ICD-10-CM

## 2018-05-31 DIAGNOSIS — N189 Chronic kidney disease, unspecified: Secondary | ICD-10-CM | POA: Diagnosis not present

## 2018-05-31 DIAGNOSIS — E785 Hyperlipidemia, unspecified: Secondary | ICD-10-CM | POA: Diagnosis not present

## 2018-05-31 DIAGNOSIS — Z981 Arthrodesis status: Secondary | ICD-10-CM

## 2018-05-31 DIAGNOSIS — R0602 Shortness of breath: Secondary | ICD-10-CM | POA: Diagnosis not present

## 2018-05-31 DIAGNOSIS — Z888 Allergy status to other drugs, medicaments and biological substances status: Secondary | ICD-10-CM

## 2018-05-31 DIAGNOSIS — K7460 Unspecified cirrhosis of liver: Secondary | ICD-10-CM | POA: Diagnosis not present

## 2018-05-31 DIAGNOSIS — I252 Old myocardial infarction: Secondary | ICD-10-CM | POA: Diagnosis not present

## 2018-05-31 DIAGNOSIS — Z791 Long term (current) use of non-steroidal anti-inflammatories (NSAID): Secondary | ICD-10-CM

## 2018-05-31 DIAGNOSIS — I361 Nonrheumatic tricuspid (valve) insufficiency: Secondary | ICD-10-CM | POA: Diagnosis not present

## 2018-05-31 DIAGNOSIS — I480 Paroxysmal atrial fibrillation: Secondary | ICD-10-CM | POA: Diagnosis not present

## 2018-05-31 DIAGNOSIS — E1122 Type 2 diabetes mellitus with diabetic chronic kidney disease: Secondary | ICD-10-CM | POA: Diagnosis not present

## 2018-05-31 DIAGNOSIS — Z96652 Presence of left artificial knee joint: Secondary | ICD-10-CM | POA: Diagnosis present

## 2018-05-31 DIAGNOSIS — I10 Essential (primary) hypertension: Secondary | ICD-10-CM | POA: Diagnosis present

## 2018-05-31 DIAGNOSIS — Z8249 Family history of ischemic heart disease and other diseases of the circulatory system: Secondary | ICD-10-CM

## 2018-05-31 DIAGNOSIS — Z955 Presence of coronary angioplasty implant and graft: Secondary | ICD-10-CM

## 2018-05-31 DIAGNOSIS — Z833 Family history of diabetes mellitus: Secondary | ICD-10-CM

## 2018-05-31 DIAGNOSIS — Z809 Family history of malignant neoplasm, unspecified: Secondary | ICD-10-CM

## 2018-05-31 LAB — BRAIN NATRIURETIC PEPTIDE: B Natriuretic Peptide: 145.9 pg/mL — ABNORMAL HIGH (ref 0.0–100.0)

## 2018-05-31 LAB — CBC WITH DIFFERENTIAL/PLATELET
Abs Immature Granulocytes: 0.04 10*3/uL (ref 0.00–0.07)
Basophils Absolute: 0 10*3/uL (ref 0.0–0.1)
Basophils Relative: 0 %
Eosinophils Absolute: 0.2 10*3/uL (ref 0.0–0.5)
Eosinophils Relative: 2 %
HCT: 40.9 % (ref 36.0–46.0)
Hemoglobin: 12.5 g/dL (ref 12.0–15.0)
Immature Granulocytes: 0 %
Lymphocytes Relative: 16 %
Lymphs Abs: 1.5 10*3/uL (ref 0.7–4.0)
MCH: 30.9 pg (ref 26.0–34.0)
MCHC: 30.6 g/dL (ref 30.0–36.0)
MCV: 101.2 fL — ABNORMAL HIGH (ref 80.0–100.0)
Monocytes Absolute: 1 10*3/uL (ref 0.1–1.0)
Monocytes Relative: 11 %
Neutro Abs: 6.3 10*3/uL (ref 1.7–7.7)
Neutrophils Relative %: 71 %
Platelets: 272 10*3/uL (ref 150–400)
RBC: 4.04 MIL/uL (ref 3.87–5.11)
RDW: 12.9 % (ref 11.5–15.5)
WBC: 9.1 10*3/uL (ref 4.0–10.5)
nRBC: 0 % (ref 0.0–0.2)

## 2018-05-31 LAB — I-STAT TROPONIN, ED: Troponin i, poc: 0 ng/mL (ref 0.00–0.08)

## 2018-05-31 LAB — PROCALCITONIN: Procalcitonin: <0.1 ng/mL

## 2018-05-31 LAB — BASIC METABOLIC PANEL
Anion gap: 10 (ref 5–15)
BUN: 18 mg/dL (ref 8–23)
CO2: 26 mmol/L (ref 22–32)
Calcium: 9 mg/dL (ref 8.9–10.3)
Chloride: 102 mmol/L (ref 98–111)
Creatinine, Ser: 1.22 mg/dL — ABNORMAL HIGH (ref 0.44–1.00)
GFR calc Af Amer: 51 mL/min — ABNORMAL LOW (ref >60)
GFR calc non Af Amer: 44 mL/min — ABNORMAL LOW (ref >60)
Glucose, Bld: 133 mg/dL — ABNORMAL HIGH (ref 70–99)
Potassium: 3.4 mmol/L — ABNORMAL LOW (ref 3.5–5.1)
Sodium: 138 mmol/L (ref 135–145)

## 2018-05-31 LAB — URINALYSIS, ROUTINE W REFLEX MICROSCOPIC
Bilirubin Urine: NEGATIVE
Glucose, UA: NEGATIVE mg/dL
Hgb urine dipstick: NEGATIVE
Ketones, ur: NEGATIVE mg/dL
Leukocytes, UA: NEGATIVE
Nitrite: NEGATIVE
Protein, ur: NEGATIVE mg/dL
Specific Gravity, Urine: 1.011 (ref 1.005–1.030)
pH: 6 (ref 5.0–8.0)

## 2018-05-31 LAB — GLUCOSE, CAPILLARY: Glucose-Capillary: 128 mg/dL — ABNORMAL HIGH (ref 70–99)

## 2018-05-31 LAB — TROPONIN I: Troponin I: <0.03 ng/mL (ref <0.03)

## 2018-05-31 LAB — MAGNESIUM: Magnesium: 2.2 mg/dL (ref 1.7–2.4)

## 2018-05-31 MED ORDER — ATORVASTATIN CALCIUM 40 MG PO TABS
40.0000 mg | ORAL_TABLET | Freq: Every day | ORAL | Status: DC
  Administered 2018-06-01 – 2018-06-02 (×2): 40 mg via ORAL
  Filled 2018-05-31 (×2): qty 1

## 2018-05-31 MED ORDER — HYDROCODONE-ACETAMINOPHEN 5-325 MG PO TABS: 1.0000 | ORAL_TABLET | ORAL | Status: DC | PRN

## 2018-05-31 MED ORDER — INSULIN ASPART 100 UNIT/ML ~~LOC~~ SOLN: 0.0000 [IU] | Freq: Three times a day (TID) | SUBCUTANEOUS | Status: DC

## 2018-05-31 MED ORDER — GUAIFENESIN ER 600 MG PO TB12
600.0000 mg | ORAL_TABLET | Freq: Two times a day (BID) | ORAL | Status: DC
  Administered 2018-05-31 – 2018-06-02 (×4): 600 mg via ORAL
  Filled 2018-05-31 (×4): qty 1

## 2018-05-31 MED ORDER — FUROSEMIDE 10 MG/ML IJ SOLN
40.0000 mg | Freq: Once | INTRAMUSCULAR | Status: AC
  Administered 2018-05-31: 40 mg via INTRAVENOUS
  Filled 2018-05-31: qty 4

## 2018-05-31 MED ORDER — ACETAMINOPHEN 325 MG PO TABS
650.0000 mg | ORAL_TABLET | Freq: Four times a day (QID) | ORAL | Status: DC | PRN
  Administered 2018-05-31 – 2018-06-01 (×2): 650 mg via ORAL
  Filled 2018-05-31 (×2): qty 2

## 2018-05-31 MED ORDER — ONDANSETRON HCL 4 MG/2ML IJ SOLN: 4.0000 mg | Freq: Four times a day (QID) | INTRAMUSCULAR | Status: DC | PRN

## 2018-05-31 MED ORDER — APIXABAN 5 MG PO TABS
5.0000 mg | ORAL_TABLET | Freq: Two times a day (BID) | ORAL | Status: DC
  Administered 2018-05-31 – 2018-06-02 (×4): 5 mg via ORAL
  Filled 2018-05-31 (×4): qty 1

## 2018-05-31 MED ORDER — INSULIN ASPART 100 UNIT/ML ~~LOC~~ SOLN: 0.0000 [IU] | Freq: Every day | SUBCUTANEOUS | Status: DC

## 2018-05-31 MED ORDER — LEVOFLOXACIN 500 MG PO TABS: 500.0000 mg | ORAL_TABLET | Freq: Every day | ORAL | Status: DC

## 2018-05-31 MED ORDER — DIPHENHYDRAMINE HCL 25 MG PO CAPS
25.0000 mg | ORAL_CAPSULE | Freq: Every evening | ORAL | Status: DC | PRN
  Administered 2018-05-31 – 2018-06-01 (×2): 25 mg via ORAL
  Filled 2018-05-31 (×2): qty 1

## 2018-05-31 MED ORDER — POTASSIUM CHLORIDE CRYS ER 20 MEQ PO TBCR
40.0000 meq | EXTENDED_RELEASE_TABLET | Freq: Once | ORAL | Status: AC
  Administered 2018-05-31: 40 meq via ORAL
  Filled 2018-05-31: qty 2

## 2018-05-31 MED ORDER — ACETAMINOPHEN 650 MG RE SUPP: 650.0000 mg | Freq: Four times a day (QID) | RECTAL | Status: DC | PRN

## 2018-05-31 MED ORDER — LEVALBUTEROL HCL 0.63 MG/3ML IN NEBU: 0.6300 mg | INHALATION_SOLUTION | Freq: Four times a day (QID) | RESPIRATORY_TRACT | Status: DC | PRN

## 2018-05-31 MED ORDER — FLUOXETINE HCL 20 MG PO CAPS
20.0000 mg | ORAL_CAPSULE | Freq: Every day | ORAL | Status: DC
  Administered 2018-06-01 – 2018-06-02 (×2): 20 mg via ORAL
  Filled 2018-05-31 (×2): qty 1

## 2018-05-31 MED ORDER — DILTIAZEM HCL ER COATED BEADS 240 MG PO CP24
240.0000 mg | ORAL_CAPSULE | Freq: Every day | ORAL | Status: DC
  Administered 2018-06-01 – 2018-06-02 (×2): 240 mg via ORAL
  Filled 2018-05-31 (×2): qty 1

## 2018-05-31 MED ORDER — FUROSEMIDE 10 MG/ML IJ SOLN
40.0000 mg | Freq: Two times a day (BID) | INTRAMUSCULAR | Status: DC
  Administered 2018-05-31 – 2018-06-02 (×4): 40 mg via INTRAVENOUS
  Filled 2018-05-31 (×4): qty 4

## 2018-05-31 MED ORDER — SODIUM CHLORIDE 0.9% FLUSH
3.0000 mL | Freq: Two times a day (BID) | INTRAVENOUS | Status: DC
  Administered 2018-05-31 – 2018-06-01 (×3): 3 mL via INTRAVENOUS

## 2018-05-31 MED ORDER — SODIUM CHLORIDE 0.9 % IV SOLN: 250.0000 mL | INTRAVENOUS | Status: DC | PRN

## 2018-05-31 MED ORDER — IOPAMIDOL (ISOVUE-370) INJECTION 76%
75.0000 mL | Freq: Once | INTRAVENOUS | Status: AC | PRN
  Administered 2018-05-31: 75 mL via INTRAVENOUS

## 2018-05-31 MED ORDER — ONDANSETRON HCL 4 MG PO TABS: 4.0000 mg | ORAL_TABLET | Freq: Four times a day (QID) | ORAL | Status: DC | PRN

## 2018-05-31 MED ORDER — SODIUM CHLORIDE 0.9% FLUSH
3.0000 mL | INTRAVENOUS | Status: DC | PRN
  Administered 2018-05-31: 3 mL via INTRAVENOUS
  Filled 2018-05-31: qty 3

## 2018-05-31 MED ORDER — IOPAMIDOL (ISOVUE-370) INJECTION 76%
INTRAVENOUS | Status: AC
  Filled 2018-05-31: qty 100

## 2018-05-31 NOTE — ED Triage Notes (Signed)
Pt arrives to ED with c/o of shortness of breath that began 1 week ago, pt went to see her PCP on Tuesday and had chest x ray done. Pt was given antibiotics and given an inhaler. Pt arrives to ED with o2 sats in 85-87% on room air does not normally wear oxygen. Pt has history of CHF.

## 2018-05-31 NOTE — ED Provider Notes (Signed)
[facility] EMERGENCY DEPARTMENT Provider Note   CSN: [id number] Arrival date & time: 05/31/18  1123     History   Chief Complaint Chief Complaint  Patient presents with  . Shortness of Breath    HPI [name] is a 72 y.o. female.  HPI Patient presents to the emergency department with shortness of breath along with cough over the last 2 weeks.  Patient was seen by her doctor and given antibiotics along with a steroid injection and albuterol.  Patient states she does have a history of CHF and she did not know if this was contributing to this as well.  Patient states she has been taking medications without significant relief of her symptoms.  The patient denies chest pain,  headache,blurred vision, neck pain, weakness, numbness, dizziness, anorexia, edema, abdominal pain, nausea, vomiting, diarrhea, rash, back pain, dysuria, hematemesis, bloody stool, near syncope, or syncope. Past Medical History:  Diagnosis Date  . Anticoagulant long-term use    pradaxa  . Anxiety   . Arthritis    "fingers, lower back" (04/23/2017   . CAD (coronary artery disease) 7824,2353   post PTCA with bare-metal stenting to mid RCA in December 2004     . CHF (congestive heart failure) ([place])   . Chronic atrial fibrillation 06/2007   Tachybradycardia pacemaker  . Chronic kidney disease    10% function - ?R, other kidney is compensating    . CVA (cerebral vascular accident) [facility]) [id number]   denies residual on 04/23/2017  . Depression   . Diplopia 06/19/2008   Qualifier: Diagnosis of  By: [name] MD, [name]   . Dysrhythmia    ATRIAL FIBRILATION  . Edema of lower extremity   . Hyperlipidemia   . Hypertension   . Inferior myocardial infarction [facility])    acute inferior wall mi/other medical hx  . Myocardial infarction ([place]) [id number]  . OSA on CPAP    last test- 2010  . Pacemaker   . Pneumonia 2014   tx. ----  [facility]  . Pulmonary hypertension ([place])    moderate  pulmonary hypertension by 10/2016 echo and 10/2013 cardiac cath  . Shortness of breath   . Skin cancer    "cut off right [place]; burned off LLE" (04/23/2017)  . Spondylolisthesis   . TIA (transient ischemic attack) 2008  . Unspecified hemorrhoids without mention of complication 0/02/6760   Colonoscopy--Dr. Carlean Purl     Patient Active Problem List   Diagnosis Date Noted  . Primary osteoarthritis of left knee 04/23/2017  . Degenerative arthritis of left knee 04/19/2017  . Atypical atrial flutter ([place]) 09/30/2016  . Hematoma 09/30/2016  . Hypotension 09/30/2016  . Persistent atrial fibrillation 09/29/2016  . Hyperlipidemia   . HLD (hyperlipidemia)   . Controlled type 2 diabetes mellitus without complication ([place])   . Acute respiratory failure with hypoxia ([place]) 03/26/2016  . DDD (degenerative disc disease), lumbar 04/05/2015  . Degenerative disc disease, lumbar 04/05/2015  . Severe obesity (BMI >= 40) ([place]) 02/16/2015  . Fatty liver disease, nonalcoholic 95/03/3266  . Cough, persistent 04/03/2014  . Acute on chronic diastolic congestive heart failure ([place]) 10/07/2013  . Pre-diabetes 09/24/2013  . Peripheral edema 08/05/2013  . Neoplasm of uncertain behavior of skin of back 11/09/2012  . Edema 11/04/2012  . Bleeding mole 11/04/2012  . Pulmonary hypertension ([place]) 06/11/2012  . Pneumonia 06/05/2012  . Hypoxia 06/05/2012  . Anemia 06/05/2012  . Hemorrhoids 05/11/2012  . Dyspepsia 05/11/2012  . Medication management 04/20/2011  .  Positional vertigo 10/07/2010  . Anticoagulant long-term use   . HERPES LABIALIS 12/30/2009  . INTERTRIGO 12/30/2009  . Obesity 06/02/2009  . Obstructive sleep apnea 10/12/2008  . HYPERLIPIDEMIA 09/15/2008  . SNORING 09/15/2008  . Essential hypertension 06/19/2008  . CAD S/P percutaneous coronary angioplasty 06/19/2008  . Paroxysmal atrial fibrillation ([place]) 06/19/2008  . BRADYCARDIA-TACHYCARDIA SYNDROME 06/19/2008  . CEREBRAL ANEURYSM 06/19/2008  .  ALLERGIC RHINITIS 06/19/2008  . HYPERGLYCEMIA 06/19/2008  . DEPRESSION, HX OF 06/19/2008  . CEREBROVASCULAR ACCIDENT, HX OF 06/19/2008  . PACEMAKER, PERMANENT 06/19/2008    Past Surgical History:  Procedure Laterality Date  . APPENDECTOMY  1984  . ATRIAL FIBRILLATION ABLATION N/A 09/29/2016   Procedure: Atrial Fibrillation Ablation;  Surgeon: Will Meredith Leeds, MD;  Location: [place] CV LAB;  Service: Cardiovascular;  Laterality: N/A;  . ATRIAL FIBRILLATION ABLATION N/A 02/07/2018   Procedure: ATRIAL FIBRILLATION ABLATION;  Surgeon: Constance Haw, MD;  Location: [place] CV LAB;  Service: Cardiovascular;  Laterality: N/A;  . BACK SURGERY    . CARDIOVERSION N/A 09/12/2017   Procedure: CARDIOVERSION;  Surgeon: Jerline Pain, MD;  Location: [facility] ENDOSCOPY;  Service: Cardiovascular;  Laterality: N/A;  . CARDIOVERSION N/A 12/13/2017   Procedure: CARDIOVERSION;  Surgeon: Sanda Klein, MD;  Location: [place] ENDOSCOPY;  Service: Cardiovascular;  Laterality: N/A;  . CARDIOVERSION N/A 02/18/2018   Procedure: CARDIOVERSION;  Surgeon: Dorothy Spark, MD;  Location: [facility] ENDOSCOPY;  Service: Cardiovascular;  Laterality: N/A;  . CARDIOVERSION N/A 05/20/2018   Procedure: CARDIOVERSION;  Surgeon: [name], MD;  Location: [facility] ENDOSCOPY;  Service: Cardiovascular;  Laterality: N/A;  . CATARACT EXTRACTION W/ INTRAOCULAR LENS  IMPLANT, BILATERAL Bilateral 01/15/2017- 03/2017  . CORONARY ANGIOPLASTY  X 2  . CORONARY ANGIOPLASTY WITH STENT PLACEMENT  1998; ~ 2007; ?date   "1 stent; replaced stent; not sure when I got the last stent" (04/23/2017)  . DOPPLER ECHOCARDIOGRAPHY  2009  . ELECTROPHYSIOLOGIC STUDY N/A 03/31/2016   Procedure: Cardioversion;  Surgeon: Evans Lance, MD;  Location: [place] CV LAB;  Service: Cardiovascular;  Laterality: N/A;  . ELECTROPHYSIOLOGIC STUDY N/A 08/04/2016   Procedure: Cardioversion;  Surgeon: Evans Lance, MD;  Location: [place] CV LAB;  Service:  Cardiovascular;  Laterality: N/A;  . INSERT / REPLACE / REMOVE PACEMAKER  06/2007  . IR RADIOLOGY PERIPHERAL GUIDED IV START  01/31/2018  . IR US GUIDE VASC ACCESS LEFT  01/31/2018  . JOINT REPLACEMENT    . LAPAROSCOPIC CHOLECYSTECTOMY  1994  . LEFT AND RIGHT HEART CATHETERIZATION WITH CORONARY ANGIOGRAM N/A 10/06/2013   Procedure: LEFT AND RIGHT HEART CATHETERIZATION WITH CORONARY ANGIOGRAM;  Surgeon: Troy Sine, MD;  Location: [facility] CATH LAB;  Service: Cardiovascular;  Laterality: N/A;  . LEFT OOPHORECTOMY Left ~ 1989  . POSTERIOR LUMBAR FUSION  2000s - 04/2015 X 3   L3-4; L4-5; L2-3; Dr Trenton Gammon  . RIGHT/LEFT HEART CATH AND CORONARY ANGIOGRAPHY N/A 05/09/2017   Procedure: RIGHT/LEFT HEART CATH AND CORONARY ANGIOGRAPHY;  Surgeon: Sherren Mocha, MD;  Location: [place] CV LAB;  Service: Cardiovascular;  Laterality: N/A;  . SKIN CANCER EXCISION Right    [place]  . TEE WITHOUT CARDIOVERSION N/A 09/29/2016   Procedure: TRANSESOPHAGEAL ECHOCARDIOGRAM (TEE);  Surgeon: Jerline Pain, MD;  Location: [place];  Service: Cardiovascular;  Laterality: N/A;  . TOTAL ABDOMINAL HYSTERECTOMY  1984   "uterus & right ovary"  . TOTAL KNEE ARTHROPLASTY Left 04/23/2017  . TOTAL KNEE ARTHROPLASTY Left 04/23/2017   Procedure: TOTAL KNEE  ARTHROPLASTY;  Surgeon: Frederik Pear, MD;  Location: [place];  Service: Orthopedics;  Laterality: Left;  . ULTRASOUND GUIDANCE FOR VASCULAR ACCESS  05/09/2017   Procedure: Ultrasound Guidance For Vascular Access;  Surgeon: Sherren Mocha, MD;  Location: [place] CV LAB;  Service: Cardiovascular;;     OB History   None      Home Medications    Prior to Admission medications   Medication Sig Start Date End Date Taking? Authorizing Provider  acetaminophen (TYLENOL) 500 MG tablet Take 500 mg by mouth every morning.    Yes [provider]  albuterol (PROVENTIL HFA;VENTOLIN HFA) 108 (90 Base) MCG/ACT inhaler Inhale 2 puffs into the lungs every 4 (four) hours as  needed for wheezing or shortness of breath. 05/28/18  Yes Burchette, Alinda Sierras, MD  Ascorbic Acid (VITAMIN C) 1000 MG tablet Take 1,000 mg by mouth daily.   Yes [provider]  atorvastatin (LIPITOR) 40 MG tablet TAKE 1 TABLET BY MOUTH EVERY DAY Patient taking differently: Take 40 mg by mouth daily.  09/12/17  Yes Nahser, Wonda Cheng, MD  Calcium Carbonate-Vitamin D (CALCIUM 600+D PO) Take 1 tablet by mouth daily.   Yes [provider]  diltiazem (CARDIZEM CD) 240 MG 24 hr capsule Take 1 capsule (240 mg total) by mouth daily. 05/14/18 08/12/18 Yes Camnitz, Will Hassell Done, MD  diphenhydramine-acetaminophen (TYLENOL PM) 25-500 MG TABS tablet Take 1 tablet by mouth at bedtime.    Yes [provider]  ELIQUIS 5 MG TABS tablet TAKE 1 TABLET BY MOUTH TWICE A DAY Patient taking differently: Take 5 mg by mouth 2 (two) times daily.  04/08/18  Yes Camnitz, Will Hassell Done, MD  FLUoxetine (PROZAC) 20 MG capsule Take 1 capsule (20 mg total) by mouth daily. 05/29/18  Yes [name], [name], MD  fluticasone (FLONASE) 50 MCG/ACT nasal spray Place 1 spray into both nostrils at bedtime as needed for allergies.    Yes [provider]  KLOR-CON M20 20 MEQ tablet TAKE 1 TABLET BY MOUTH TWICE A DAY Patient taking differently: Take 20 mEq by mouth 2 (two) times daily.  04/10/18  Yes Nahser, Wonda Cheng, MD  levofloxacin (LEVAQUIN) 500 MG tablet Take 1 tablet (500 mg total) by mouth daily. 05/28/18  Yes Burchette, Alinda Sierras, MD  loratadine (CLARITIN) 10 MG tablet TAKE 1 TABLET BY MOUTH EVERY DAY Patient taking differently: Take 10 mg by mouth daily.  04/24/18  Yes [name], [name], MD  Lysine 1000 MG TABS Take 1,000 mg by mouth daily at 2 PM.    Yes [provider]  Multiple Vitamin (MULTIVITAMIN WITH MINERALS) TABS tablet Take 1 tablet by mouth daily. Centrum   Yes [provider]  nitroGLYCERIN (NITROSTAT) 0.4 MG SL tablet Place 1 tablet (0.4 mg total) under the tongue every 5 (five)  minutes x 3 doses as needed for chest pain. 07/28/16  Yes Nahser, Wonda Cheng, MD  Polyethyl Glycol-Propyl Glycol (SYSTANE) 0.4-0.3 % SOLN Place 1 drop into both eyes at bedtime as needed (for dry eyes).    Yes [provider]  torsemide (DEMADEX) 20 MG tablet Take 2 tablets (40 mg total) by mouth 2 (two) times daily. 05/09/18 08/07/18 Yes Sherran Needs, NP  valACYclovir (VALTREX) 1000 MG tablet Take 1 tablet (1,000 mg total) by mouth every 12 (twelve) hours as needed (cold sores). 05/20/18  Yes Turner, Eber Hong, MD  vitamin B-12 (CYANOCOBALAMIN) 500 MCG tablet Take 500 mcg by mouth at bedtime.   Yes [provider]  vitamin E 400 UNIT capsule Take 800 Units by mouth daily at 2 PM.    Yes [provider]    Family History Family History  Problem Relation Age of Onset  . Suicidality Father        suicide death pt was 3 yrs  . Arrhythmia Mother   . Hypertension Mother   . Diabetes Mother   . Heart attack Brother 57  . Heart disease Paternal Aunt   . Prostate cancer Maternal Grandfather   . Diabetes Paternal Grandfather        fathers side of the family    Social History Social History   Tobacco Use  . Smoking status: Former Smoker    Packs/day: 1.00    Years: 5.00    Pack years: 5.00    Types: Cigarettes    Start date: 05/11/1978    Last attempt to quit: 07/03/1982    Years since quitting: 35.9  . Smokeless tobacco: Never Used  Substance Use Topics  . Alcohol use: Yes    Comment: 04/23/2017 "once q 6 months; glass of wine"  . Drug use: No     Allergies   Adhesive [tape]   Review of Systems Review of Systems All other systems negative except as documented in the HPI. All pertinent positives and negatives as reviewed in the HPI.  Physical Exam Updated Vital Signs BP (!) 130/50   Pulse 73   Temp 99.2 F (37.3 C) (Oral)   Resp 20   SpO2 92%   Physical Exam  Constitutional: She is oriented to person, place, and time. She appears well-developed  and well-nourished. No distress.  HENT:  Head: Normocephalic and atraumatic.  Mouth/Throat: Oropharynx is clear and moist.  Eyes: Pupils are equal, round, and reactive to light.  Neck: Normal range of motion. Neck supple.  Cardiovascular: Normal rate, regular rhythm and normal heart sounds. Exam reveals no gallop and no friction rub.  No murmur heard. Pulmonary/Chest: Effort normal. No respiratory distress. She has no wheezes. She has rales.  And also has crackles in the bases.  Abdominal: Soft. Bowel sounds are normal. She exhibits no distension. There is no tenderness.  Neurological: She is alert and oriented to person, place, and time. She exhibits normal muscle tone. Coordination normal.  Skin: Skin is warm and dry. Capillary refill takes less than 2 seconds. No rash noted. No erythema.  Psychiatric: She has a normal mood and affect. Her behavior is normal.  Nursing note and vitals reviewed.    ED Treatments / Results  Labs (all labs ordered are listed, but only abnormal results are displayed) Labs Reviewed  BASIC METABOLIC PANEL - Abnormal; Notable for the following components:      Result Value   Potassium 3.4 (*)    Glucose, Bld 133 (*)    Creatinine, Ser 1.22 (*)    GFR calc non Af Amer 44 (*)    GFR calc Af Amer 51 (*)    All other components within normal limits  CBC WITH DIFFERENTIAL/PLATELET - Abnormal; Notable for the following components:   MCV 101.2 (*)    All other components within normal limits  BRAIN NATRIURETIC PEPTIDE - Abnormal; Notable for the following components:   B Natriuretic Peptide 145.9 (*)    All other components within normal limits  URINALYSIS, ROUTINE W REFLEX MICROSCOPIC  I-STAT TROPONIN, ED    EKG EKG Interpretation  Date/Time:  Friday May 31 2018 11:37:34 EST Ventricular Rate:  78 PR  Interval:    QRS Duration: 98 QT Interval:  396 QTC Calculation: 452 R Axis:   81 Text Interpretation:  Sinus rhythm Borderline right axis  deviation No significant change since last tracing Confirmed by Orlie Dakin [phone]) on 05/31/2018 11:46:48 AM   Radiology Dg Chest 2 View  Result Date: 05/31/2018 CLINICAL DATA:  Shortness of breath. EXAM: CHEST - 2 VIEW COMPARISON:  05/28/2018. FINDINGS: Cardiac pacer with lead tips over the right atrium right ventricle. Cardiomegaly with diffuse bilateral pulmonary infiltrates/edema. Findings consistent CHF. No pleural effusion. No pneumothorax. IMPRESSION: Cardiac pacer with lead tips over the right atrium right ventricle. Cardiomegaly. Diffuse bilateral pulmonary infiltrates/edema. Findings consistent CHF. Electronically Signed   By: Marcello Moores  Register   On: 05/31/2018 12:44    Procedures Procedures (including critical care time)  Medications Ordered in ED Medications  furosemide (LASIX) injection 40 mg (40 mg Intravenous Given 05/31/18 1425)     Initial Impression / Assessment and Plan / ED Course  I have reviewed the triage vital signs and the nursing notes.  Pertinent labs & imaging results that were available during my care of the patient were reviewed by me and considered in my medical decision making (see chart for details).     She will have CT scanning of the chest to further delineate if this is edema versus infection versus blood clot.  Patient will need admission in the hospital due to the fact that she does have an increased oxygen requirement where she normally does not use oxygen at home.  Patient has been otherwise stable here in the emergency department and satting well on 2 L of nasal cannula oxygen.  Final Clinical Impressions(s) / ED Diagnoses   Final diagnoses:  None    ED Discharge Orders    None       Dalia Heading, PA-C 05/31/18 1613    Orlie Dakin, MD 05/31/18 [phone]

## 2018-05-31 NOTE — ED Notes (Signed)
Pt returned from ct

## 2018-05-31 NOTE — ED Notes (Signed)
To ct

## 2018-05-31 NOTE — ED Notes (Signed)
Food given to the pt

## 2018-05-31 NOTE — ED Notes (Signed)
Up to bedside commode

## 2018-05-31 NOTE — ED Provider Notes (Signed)
72 year old received at sign out from [name] pending CT and admission. Per his HPI:   "Patient presents to the emergency department with shortness of breath along with cough over the last 2 weeks.  Patient was seen by her doctor and given antibiotics along with [name] steroid injection and albuterol.  Patient states she does have [name] history of CHF and she did not know if this was contributing to this as well.  Patient states she has been taking medications without significant relief of her symptoms.  The patient denies chest pain,  headache,blurred vision, neck pain, weakness, numbness, dizziness, anorexia, edema, abdominal pain, nausea, vomiting, diarrhea, rash, back pain, dysuria, hematemesis, bloody stool, near syncope, or syncope."  Physical Exam  BP (!) 137/52   Pulse 73   Temp 98.7 F (37.1 C) (Oral)   Resp 20   Ht 5\' 6"  (1.676 m)   Wt 113.9 kg Comment: scale C  SpO2 94%   BMI 40.53 kg/m   Physical Exam Onward in place. NAD. No increased work of breathing.  Crackles auscultated the bilateral bases.  ED Course/Procedures     Procedures  MDM   72 year old female received signout from [name] pending CT and ultimately admission for new oxygen requirement.  Please see his chart for further history, physical exam, medical decision making.  CT with no evidence of PE, but there is [name] peripheral foci of confluent airspace consolidation in both lungs concerning for superimposed infection or inflammation.  Radiology recommends follow-up high-resolution CT after treatment of CHF to better characterize lung disease.  There is also progressive mediastinal and bilateral hilar lymphadenopathy.  The patient was documented to have an SaO2 of 84% on arrival and was placed on 2 L O2 nasal cannula.  No history of home O2 use.  She was given IV Lasix.   Will order repeat temperature as the patient was 99.2 orally on arrival.  CBC with WBC of 9.1 and no left shift to suggest acute infectious or inflammatory  process at this time.  Consult to the hospitalist team and spoke with Dr. Roel Cluck who will accept the patient for admission. The patient appears reasonably stabilized for admission considering the current resources, flow, and capabilities available in the ED at this time, and I doubt any other [facility] requiring further screening and/or treatment in the ED prior to admission.     Samantha Maxcy A, PA-C 05/31/18 2031    Samantha Johns, MD 05/31/18 2038

## 2018-05-31 NOTE — ED Notes (Signed)
Pt up to bedside commode

## 2018-05-31 NOTE — H&P (Signed)
[name]:[id number] DOB: [birth year] DOA: 05/31/2018     PCP: [name], MD   Outpatient Specialists:  CARDS: Dr. Servando Salina, Nah[place]    Patient arrived to ER on 05/31/18 at 1123  Patient coming from: home Lives  With family    Chief Complaint:  Chief Complaint  Patient presents with  . Shortness of Breath    HPI: [name] is a 72 y.o. female with medical history significant of a.flutter coronary artery disease status post stent to the RCA in 2004, CHF (grade 2 diastolic dysfunction),  tachybradycardia syndrome status post pacemaker, CKD, hypertension, hyperlipidemia, inferior wall MI, sleep apnea on CPAP not on O2 , stroke former tobacco abuse quit in 1984    Presented with   but a week ago patient developed sore throat and runny nose with some cough some phlegm yellow  and shortness of breath.  Denies sick contactsShe was seen by PCP Dr. Elease Hashimoto in office. They obtain chest x-ray given nebulizer started her on Levaquin 500 mg a day for 7 days given 80 mg of Depo-Medrol IM She is first started to feel better But has deteriorated over the Thanksgiving called EMS today oxygen saturation was 8587% room air she is not on oxygen at baseline. Associated chest pain headache no neurological complaints no peripheral edema no abdominal pain nausea vomiting or diarrhea no blood per rectum or melena.  Denies leg edema no increase in weight  Regarding pertinent Chronic problems: Had ablation on 09/29/16. she has required 2 cardioversions.  She had repeat ablation 02/07/2018. /Atrial flutter status post cardioversion on 18 November Anticoagulation with Eliquis ECHO 2018 EF 31%-54% (grade 2 diastolic dysfunction   Hx of DM diet controlled    While in ER: BNP noted to be 145 Patient was given 40 mg of Lasix IV CTA of her chest was ordered given new hypoxia Currently satting 93% on 3 L  states she is doing better after IV lasix but still requires O2 The  following Work up has been ordered so far:  Orders Placed This Encounter  Procedures  . DG Chest 2 View  . CT Angio Chest PE W/Cm &/Or Wo Cm  . Basic metabolic panel  . CBC with Differential  . Urinalysis, Routine w reflex microscopic  . Brain natriuretic peptide  . Cardiac monitoring  . Consult to hospitalist  . I-stat troponin, ED  . EKG 12-Lead  . ED EKG  . Saline lock IV    Following Medications were ordered in ER: Medications  iopamidol (ISOVUE-370) 76 % injection (has no administration in time range)  furosemide (LASIX) injection 40 mg (40 mg Intravenous Given 05/31/18 1425)  iopamidol (ISOVUE-370) 76 % injection 75 mL (75 mLs Intravenous Contrast Given 05/31/18 1619)    Significant initial  Findings: Abnormal Labs Reviewed  BASIC METABOLIC PANEL - Abnormal; Notable for the following components:      Result Value   Potassium 3.4 (*)    Glucose, Bld 133 (*)    Creatinine, Ser 1.22 (*)    GFR calc non Af Amer 44 (*)    GFR calc Af Amer 51 (*)    All other components within normal limits  CBC WITH DIFFERENTIAL/PLATELET - Abnormal; Notable for the following components:   MCV 101.2 (*)    All other components within normal limits  BRAIN NATRIURETIC PEPTIDE - Abnormal; Notable for the following components:   B Natriuretic Peptide 145.9 (*)    All other components within  normal limits   Troponin 0 0.00  Lactic Acid, Venous No results found for: LATICACIDVEN  Na 138 K 3.4  Cr  stable,    Lab Results  Component Value Date   CREATININE 1.22 (H) 05/31/2018   CREATININE 1.24 (H) 05/14/2018   CREATININE 1.22 (H) 05/06/2018      WBC 9.1 HG/HCT   Stable,     Component Value Date/Time   HGB 12.5 05/31/2018 1159   HGB 14.8 05/14/2018 1136   HCT 40.9 05/31/2018 1159   HCT 43.6 05/14/2018 1136     Troponin (Point of Care Test) Recent Labs    05/31/18 1213  TROPIPOC 0.00      BNP (last 3 results) Recent Labs    05/31/18 1200  BNP 145.9*    ProBNP  (last 3 results) No results for input(s): PROBNP in the last 8760 hours.     UA  no evidence of UTI      CXR -diffuse bilateral infiltrates versus edema CTA chest-no PE evidence of CHF cannot rule out superimposed infection progressive bilateral hilar lymphadenopathy suspect hepatic cirrhosis    ECG:  Personally reviewed by me showing: HR : 78 Rhythm:  NSR   no evidence of ischemic changes [id number]     ED Triage Vitals  Enc Vitals Group     BP 05/31/18 1139 (!) 105/40     Pulse Rate 05/31/18 1139 77     Resp 05/31/18 1139 (!) 22     Temp 05/31/18 1139 99.2 F (37.3 C)     Temp Source 05/31/18 1139 Oral     SpO2 05/31/18 1139 93 %     Weight --      Height --      Head Circumference --      Peak Flow --      Pain Score 05/31/18 1140 0     Pain Loc --      Pain Edu? --      Excl. in [place]? --   TMAX(24)@       Latest  Blood pressure (!) 143/118, pulse 73, temperature 99.2 F (37.3 C), temperature source Oral, resp. rate (!) 22, SpO2 93 %.      Hospitalist was called for admission for CHF vs CAP   Review of Systems:    Pertinent positives include:  fatigue,Sore throat,   nasal congestion, post nasal drip,  shortness of breath at rest.   dyspnea on exertion  Constitutional:  No weight loss, night sweats, Fevers, chills, weight loss  HEENT:  No headaches, Difficulty swallowing,Tooth/dental problems, No sneezing, itching, ear ache Cardio-vascular:  No chest pain, Orthopnea, PND, anasarca, dizziness, palpitations.no Bilateral lower extremity swelling  GI:  No heartburn, indigestion, abdominal pain, nausea, vomiting, diarrhea, change in bowel habits, loss of appetite, melena, blood in stool, hematemesis Resp:  no, No excess mucus, no productive cough, No non-productive cough, No coughing up of blood.No change in color of mucus.No wheezing. Skin:  no rash or lesions. No jaundice GU:  no dysuria, change in color of urine, no urgency or frequency. No straining to  urinate.  No flank pain.  Musculoskeletal:  No joint pain or no joint swelling. No decreased range of motion. No back pain.  Psych:  No change in mood or affect. No depression or anxiety. No memory loss.  Neuro: no localizing neurological complaints, no tingling, no weakness, no double vision, no gait abnormality, no slurred speech, no confusion  All systems reviewed and apart from [place] all  are negative  Past Medical History:   Past Medical History:  Diagnosis Date  . Anticoagulant long-term use    pradaxa  . Anxiety   . Arthritis    "fingers, lower back" (04/23/2017   . CAD (coronary artery disease) [id number]   post PTCA with bare-metal stenting to mid RCA in December 2004     . CHF (congestive heart failure) ([place])   . Chronic atrial fibrillation 06/2007   Tachybradycardia pacemaker  . Chronic kidney disease    10% function - ?R, other kidney is compensating    . CVA (cerebral vascular accident) [facility] [phone]   denies residual on 04/23/2017  . Depression   . Diplopia 06/19/2008   Qualifier: Diagnosis of  By: [name] MD, [name]   . Dysrhythmia    ATRIAL FIBRILATION  . Edema of lower extremity   . Hyperlipidemia   . Hypertension   . Inferior myocardial infarction [facility])    acute inferior wall mi/other medical hx  . Myocardial infarction ([place]) [id number]  . OSA on CPAP    last test- 2010  . Pacemaker   . Pneumonia 2014   tx. ----  [facility]  . Pulmonary hypertension ([place])    moderate pulmonary hypertension by 10/2016 echo and 10/2013 cardiac cath  . Shortness of breath   . Skin cancer    "cut off right [place]; burned off LLE" (04/23/2017)  . Spondylolisthesis   . TIA (transient ischemic attack) 2008  . Unspecified hemorrhoids without mention of complication 5/63/8756   Colonoscopy--Dr. Carlean Purl       Past Surgical History:  Procedure Laterality Date  . APPENDECTOMY  1984  . ATRIAL FIBRILLATION ABLATION N/A 09/29/2016   Procedure: Atrial Fibrillation  Ablation;  Surgeon: Will Meredith Leeds, MD;  Location: [place] CV LAB;  Service: Cardiovascular;  Laterality: N/A;  . ATRIAL FIBRILLATION ABLATION N/A 02/07/2018   Procedure: ATRIAL FIBRILLATION ABLATION;  Surgeon: Constance Haw, MD;  Location: [place] CV LAB;  Service: Cardiovascular;  Laterality: N/A;  . BACK SURGERY    . CARDIOVERSION N/A 09/12/2017   Procedure: CARDIOVERSION;  Surgeon: Jerline Pain, MD;  Location: [facility] ENDOSCOPY;  Service: Cardiovascular;  Laterality: N/A;  . CARDIOVERSION N/A 12/13/2017   Procedure: CARDIOVERSION;  Surgeon: Sanda Klein, MD;  Location: [place] ENDOSCOPY;  Service: Cardiovascular;  Laterality: N/A;  . CARDIOVERSION N/A 02/18/2018   Procedure: CARDIOVERSION;  Surgeon: Dorothy Spark, MD;  Location: [facility] ENDOSCOPY;  Service: Cardiovascular;  Laterality: N/A;  . CARDIOVERSION N/A 05/20/2018   Procedure: CARDIOVERSION;  Surgeon: [name], MD;  Location: [facility] ENDOSCOPY;  Service: Cardiovascular;  Laterality: N/A;  . CATARACT EXTRACTION W/ INTRAOCULAR LENS  IMPLANT, BILATERAL Bilateral 01/15/2017- 03/2017  . CORONARY ANGIOPLASTY  X 2  . CORONARY ANGIOPLASTY WITH STENT PLACEMENT  1998; ~ 2007; ?date   "1 stent; replaced stent; not sure when I got the last stent" (04/23/2017)  . DOPPLER ECHOCARDIOGRAPHY  2009  . ELECTROPHYSIOLOGIC STUDY N/A 03/31/2016   Procedure: Cardioversion;  Surgeon: Evans Lance, MD;  Location: [place] CV LAB;  Service: Cardiovascular;  Laterality: N/A;  . ELECTROPHYSIOLOGIC STUDY N/A 08/04/2016   Procedure: Cardioversion;  Surgeon: Evans Lance, MD;  Location: [place] CV LAB;  Service: Cardiovascular;  Laterality: N/A;  . INSERT / REPLACE / REMOVE PACEMAKER  06/2007  . IR RADIOLOGY PERIPHERAL GUIDED IV START  01/31/2018  . IR US GUIDE VASC ACCESS LEFT  01/31/2018  . JOINT REPLACEMENT    . LAPAROSCOPIC CHOLECYSTECTOMY  1994  . LEFT AND RIGHT HEART CATHETERIZATION WITH CORONARY ANGIOGRAM N/A 10/06/2013   Procedure: LEFT  AND RIGHT HEART CATHETERIZATION WITH CORONARY ANGIOGRAM;  Surgeon: Troy Sine, MD;  Location: [facility] CATH LAB;  Service: Cardiovascular;  Laterality: N/A;  . LEFT OOPHORECTOMY Left ~ 1989  . POSTERIOR LUMBAR FUSION  2000s - 04/2015 X 3   L3-4; L4-5; L2-3; Dr Trenton Gammon  . RIGHT/LEFT HEART CATH AND CORONARY ANGIOGRAPHY N/A 05/09/2017   Procedure: RIGHT/LEFT HEART CATH AND CORONARY ANGIOGRAPHY;  Surgeon: Sherren Mocha, MD;  Location: [place] CV LAB;  Service: Cardiovascular;  Laterality: N/A;  . SKIN CANCER EXCISION Right    [place]  . TEE WITHOUT CARDIOVERSION N/A 09/29/2016   Procedure: TRANSESOPHAGEAL ECHOCARDIOGRAM (TEE);  Surgeon: Jerline Pain, MD;  Location: [place];  Service: Cardiovascular;  Laterality: N/A;  . TOTAL ABDOMINAL HYSTERECTOMY  1984   "uterus & right ovary"  . TOTAL KNEE ARTHROPLASTY Left 04/23/2017  . TOTAL KNEE ARTHROPLASTY Left 04/23/2017   Procedure: TOTAL KNEE ARTHROPLASTY;  Surgeon: Frederik Pear, MD;  Location: [place];  Service: Orthopedics;  Laterality: Left;  . ULTRASOUND GUIDANCE FOR VASCULAR ACCESS  05/09/2017   Procedure: Ultrasound Guidance For Vascular Access;  Surgeon: Sherren Mocha, MD;  Location: [place] CV LAB;  Service: Cardiovascular;;    Social History:  Ambulatory  independently      reports that she quit smoking about 35 years ago. Her smoking use included cigarettes. She started smoking about 40 years ago. She has a 5.00 pack-year smoking history. She has never used smokeless tobacco. She reports that she drinks alcohol. She reports that she does not use drugs.     Family History:  Family History  Problem Relation Age of Onset  . Suicidality Father        suicide death pt was 3 yrs  . Arrhythmia Mother   . Hypertension Mother   . Diabetes Mother   . Heart attack Brother 70  . Heart disease Paternal Aunt   . Prostate cancer Maternal Grandfather   . Diabetes Paternal Grandfather        fathers side of the family     Allergies: Allergies  Allergen Reactions  . Adhesive [Tape] Rash and Other (See Comments)    Allergic to EKG stickers and defibrillation pads.     Prior to Admission medications   Medication Sig Start Date End Date Taking? Authorizing Provider  acetaminophen (TYLENOL) 500 MG tablet Take 500 mg by mouth every morning.    Yes [provider]  albuterol (PROVENTIL HFA;VENTOLIN HFA) 108 (90 Base) MCG/ACT inhaler Inhale 2 puffs into the lungs every 4 (four) hours as needed for wheezing or shortness of breath. 05/28/18  Yes Burchette, Alinda Sierras, MD  Ascorbic Acid (VITAMIN C) 1000 MG tablet Take 1,000 mg by mouth daily.   Yes [provider]  atorvastatin (LIPITOR) 40 MG tablet TAKE 1 TABLET BY MOUTH EVERY DAY Patient taking differently: Take 40 mg by mouth daily.  09/12/17  Yes Nahser, Wonda Cheng, MD  Calcium Carbonate-Vitamin D (CALCIUM 600+D PO) Take 1 tablet by mouth daily.   Yes [provider]  diltiazem (CARDIZEM CD) 240 MG 24 hr capsule Take 1 capsule (240 mg total) by mouth daily. 05/14/18 08/12/18 Yes Camnitz, Will Hassell Done, MD  diphenhydramine-acetaminophen (TYLENOL PM) 25-500 MG TABS tablet Take 1 tablet by mouth at bedtime.    Yes [provider]  ELIQUIS 5 MG TABS tablet TAKE 1 TABLET BY MOUTH TWICE A DAY Patient  taking differently: Take 5 mg by mouth 2 (two) times daily.  04/08/18  Yes Camnitz, Will Hassell Done, MD  FLUoxetine (PROZAC) 20 MG capsule Take 1 capsule (20 mg total) by mouth daily. 05/29/18  Yes [name], [name], MD  fluticasone (FLONASE) 50 MCG/ACT nasal spray Place 1 spray into both nostrils at bedtime as needed for allergies.    Yes [provider]  KLOR-CON M20 20 MEQ tablet TAKE 1 TABLET BY MOUTH TWICE A DAY Patient taking differently: Take 20 mEq by mouth 2 (two) times daily.  04/10/18  Yes Nahser, Wonda Cheng, MD  levofloxacin (LEVAQUIN) 500 MG tablet Take 1 tablet (500 mg total) by mouth daily. 05/28/18  Yes Burchette, Alinda Sierras, MD   loratadine (CLARITIN) 10 MG tablet TAKE 1 TABLET BY MOUTH EVERY DAY Patient taking differently: Take 10 mg by mouth daily.  04/24/18  Yes [name], [name], MD  Lysine 1000 MG TABS Take 1,000 mg by mouth daily at 2 PM.    Yes [provider]  Multiple Vitamin (MULTIVITAMIN WITH MINERALS) TABS tablet Take 1 tablet by mouth daily. Centrum   Yes [provider]  nitroGLYCERIN (NITROSTAT) 0.4 MG SL tablet Place 1 tablet (0.4 mg total) under the tongue every 5 (five) minutes x 3 doses as needed for chest pain. 07/28/16  Yes Nahser, Wonda Cheng, MD  Polyethyl Glycol-Propyl Glycol (SYSTANE) 0.4-0.3 % SOLN Place 1 drop into both eyes at bedtime as needed (for dry eyes).    Yes [provider]  torsemide (DEMADEX) 20 MG tablet Take 2 tablets (40 mg total) by mouth 2 (two) times daily. 05/09/18 08/07/18 Yes Sherran Needs, NP  valACYclovir (VALTREX) 1000 MG tablet Take 1 tablet (1,000 mg total) by mouth every 12 (twelve) hours as needed (cold sores). 05/20/18  Yes Turner, Eber Hong, MD  vitamin B-12 (CYANOCOBALAMIN) 500 MCG tablet Take 500 mcg by mouth at bedtime.   Yes [provider]  vitamin E 400 UNIT capsule Take 800 Units by mouth daily at 2 PM.    Yes [provider]   Physical Exam: Blood pressure (!) 143/118, pulse 73, temperature 99.2 F (37.3 C), temperature source Oral, resp. rate (!) 22, SpO2 93 %. 1. General:  in No Acute distress  Chronically ill -appearing 2. Psychological: Alert and Oriented 3. Head/ENT:   Moist   Mucous Membranes                          Head Non traumatic, neck supple                          Poor Dentition 4. SKIN: normal Skin turgor,  Skin clean Dry and intact no rash 5. Heart: Regular rate and rhythm no  Murmur, no Rub or gallop 6. Lungs:occasional  wheezes mild crackles  At baseline 7. Abdomen: Soft non-tender, Non distended   obese  bowel sounds present 8. Lower extremities: no clubbing, cyanosis, or  edema 9.  Neurologically Grossly intact, moving all 4 extremities equally  10. MSK: Normal range of motion   LABS:     Recent Labs  Lab 05/31/18 1159  WBC 9.1  NEUTROABS 6.3  HGB 12.5  HCT 40.9  MCV 101.2*  PLT 147   Basic Metabolic Panel: Recent Labs  Lab 05/31/18 1159  NA 138  K 3.4*  CL 102  CO2 26  GLUCOSE 133*  BUN 18  CREATININE 1.22*  CALCIUM 9.0    No results for input(s): AST, ALT, ALKPHOS, BILITOT, PROT, ALBUMIN in the last 168 hours. No results for input(s): LIPASE, AMYLASE in the last 168 hours. No results for input(s): AMMONIA in the last 168 hours.    HbA1C: No results for input(s): HGBA1C in the last 72 hours. CBG: No results for input(s): GLUCAP in the last 168 hours.    Urine analysis:    Component Value Date/Time   COLORURINE YELLOW 05/31/2018 [place] 05/31/2018 1159   LABSPEC 1.011 05/31/2018 1159   PHURINE 6.0 05/31/2018 1159   GLUCOSEU NEGATIVE 05/31/2018 1159   HGBUR NEGATIVE 05/31/2018 1159   HGBUR negative 06/01/2010 0841   BILIRUBINUR NEGATIVE 05/31/2018 1159   BILIRUBINUR n 11/04/2012 1057   KETONESUR NEGATIVE 05/31/2018 1159   PROTEINUR NEGATIVE 05/31/2018 1159   UROBILINOGEN 0.2 11/04/2012 1057   UROBILINOGEN 0.2 06/01/2010 0841   NITRITE NEGATIVE 05/31/2018 1159   LEUKOCYTESUR NEGATIVE 05/31/2018 1159    Cultures:    Component Value Date/Time   SDES URINE, RANDOM 03/28/2016 1050   SPECREQUEST NONE 03/28/2016 1050   CULT NO GROWTH 03/28/2016 1050   REPTSTATUS 03/29/2016 FINAL 03/28/2016 1050     Radiological Exams on Admission: Dg Chest 2 View  Result Date: 05/31/2018 CLINICAL DATA:  Shortness of breath. EXAM: CHEST - 2 VIEW COMPARISON:  05/28/2018. FINDINGS: Cardiac pacer with lead tips over the right atrium right ventricle. Cardiomegaly with diffuse bilateral pulmonary infiltrates/edema. Findings consistent CHF. No pleural effusion. No pneumothorax. IMPRESSION: Cardiac pacer with lead tips over the right  atrium right ventricle. Cardiomegaly. Diffuse bilateral pulmonary infiltrates/edema. Findings consistent CHF. Electronically Signed   By: Marcello Moores  Register   On: 05/31/2018 12:44   Ct Angio Chest Pe W/cm &/or Wo Cm  Result Date: 05/31/2018 CLINICAL DATA:  Unexplained shortness of breath over the past several days. Patient has indwelling pacemaker due to tachycardia-bradycardia syndrome. Multiple prior cardioversion 6. Current history of CHF. EXAM: CT ANGIOGRAPHY CHEST WITH CONTRAST TECHNIQUE: Multidetector CT imaging of the chest was performed using the standard protocol during bolus administration of intravenous contrast. Multiplanar CT image reconstructions and MIPs were obtained to evaluate the vascular anatomy. CONTRAST:  31mL ISOVUE-370 IOPAMIDOL INJECTION 76% IV. COMPARISON:  Visualized chest on cardiac CT 01/31/2018, 07/04/2017, 09/25/2016. CT chest 04/09/2017, 01/18/2017. FINDINGS: Cardiovascular: Contrast opacification of pulmonary arteries is very good. No filling defects within either main pulmonary artery or their segmental branches in either lung to suggest pulmonary embolism. Heart moderately enlarged. Dual lead transvenous pacemaker with the lead tips near the RIGHT atrial appendage and at the RIGHT ventricular apex, unchanged. No significant pericardial effusion. Moderate three-vessel coronary atherosclerosis with a RIGHT coronary artery stent. Mild atherosclerosis involving the thoracic and proximal abdominal aorta without evidence of aneurysm. Mediastinum/Nodes: Conglomerate lymphadenopathy throughout the mediastinum and in both hila. These nodes have increased in size and have become more conglomerate since the prior cardiac CT 01/31/2018 (an index preaortic lymph node which is separate from the conglomerate nodes in this location currently measures approximately 1.6 x 1.3 cm (previously 1.3 x 0.8 cm). Normal appearing esophagus. Thyroid gland difficult to visualize due to beam hardening  streak artifact from the battery generator pack of the indwelling pacemaker in the LEFT chest wall; that said, the visualized thyroid gland is unremarkable. Lungs/Pleura: Since the most recent cardiac CT 01/31/2018, the patient has developed diffuse ground-glass opacity throughout both lungs with associated interlobular septal thickening as well as new more confluent consolidation peripherally in both lungs.  This appearance of the lungs is also progressive since the prior high-resolution CT chest examinations in 2018. No associated pleural effusions. Central airways are diminutive but patent with bronchial wall thickening. Upper Abdomen: Irregular hepatic contour and relative enlargement of the LEFT lobe of the visualized liver. Splenic lesion with rim calcification as noted previously, unchanged. Remaining visualized upper abdomen unremarkable for the early arterial phase of enhancement. Musculoskeletal: Degenerative disc disease and spondylosis diffusely throughout the visualized LOWER cervical spine and the thoracic spine. No acute findings. Review of the MIP images confirms the above findings. IMPRESSION: 1. No evidence of pulmonary embolism. 2. Pulmonary findings which are most likely due to CHF, though peripheral foci of confluent airspace consolidation are present in both lungs and therefore superimposed infection or inflammation may be present. A follow-up high-resolution CT after treatment of the CHF may be helpful to better characterize the lung disease. 3. Progressive mediastinal and BILATERAL hilar lymphadenopathy since the August, 2019 cardiac CT. 4. Hepatic cirrhosis is suspected. Aortic Atherosclerosis (ICD10-I70.0). Electronically Signed   By: Evangeline Dakin M.D.   On: 05/31/2018 17:07    Chart has been reviewed    Assessment/Plan   72 y.o. female with medical history significant of a.flutter coronary artery disease status post stent to the RCA in 2004, DM 2 CHF (grade 2 diastolic  dysfunction),  tachybradycardia syndrome status post pacemaker, CKD, hypertension, hyperlipidemia, inferior wall MI, sleep apnea on CPAP, stroke former tobacco abuse quit in 1984  Admitted for CHF vs infiltrates   Present on Admission: . Acute respiratory failure with hypoxia (HCC) multifactorial but suspect most likely secondary to CHF exacerbation continue with oxygen will need to PT OT to ambulate prior to discharge to see if she needs oxygen with ambulation . Obstructive sleep apnea -on CPAP . Paroxysmal atrial fibrillation (HCC) -           - CHA2DS2 vas score 6 : continue current anticoagulation with   Eliquis,       -  Rate control:  Currently controlled with  Diltiazem,  will continue    . BRADYCARDIA-TACHYCARDIA SYNDROME is post pacemaker . PACEMAKER, PERMANENT contrary to bradycardia tachycardia syndrome currently stable follow-up with cardiology as an outpatient . Pulmonary hypertension ([place]) -repeat echogram to further evaluate to see if it is contributing to her current shortness of breath . Acute on chronic diastolic congestive heart failure ([place]) -  - admit on telemetry,  cycle cardiac enzymes, Troponin   obtain serial ECG  to evaluate for ischemia as a cause of heart failure  monitor daily weight:  Filed Weights   05/31/18 1957  Weight: 113.9 kg   Last BNP BNP (last 3 results) Recent Labs    05/31/18 1200  BNP 145.9*    ProBNP (last 3 results) No results for input(s): PROBNP in the last 8760 hours.   diurese with IV lasix and monitor orthostatics and creatinine to avoid over diuresis.  Order echogram to evaluate EF and valves  Please consult cardiology as needed  . Essential hypertension -stable continue home medications . Fatty liver disease, nonalcoholic -check lipid panel . Severe obesity (BMI >= 40) (HCC) would benefit from nutritional consult . HLD (hyperlipidemia) stable continue Lipitor . Bronchitis -given no fever and no evidence of infectious process  otherwise true pneumonia is less likely check procalcitonin level Continue Levaquin as previously ordered as an outpatient nebulizer as needed Would benefit from repeat imaging once diuresis to make sure there is no underlying pathology  Diabetes mellitus diet  controlled -   - Order Sensitive   SSI   -  check TSH and HgA1C Diet controlled    Other plan as per orders.  DVT prophylaxis:  Eliquis    Code Status:    DNR/DNI   per patient   I had personally discussed CODE STATUS with patient and family     Family Communication:   Family  at  Bedside  plan of care was discussed with   Husband, Disposition Plan:      To home once workup is complete and patient is stable                    Would benefit from PT/OT eval prior to DC  Ordered                                      Consults called: none  Admission status:   inpatient     Expect 2 midnight stay secondary to severity of patient's current illness including   hemodynamic instability despite optimal treatment ( hypoxia)  Severe lab/radiological abnormalities including: Evidence of CHF and extensive comorbidities including:  DM2   CHF    COPD    CKD   That are currently affecting medical management.  I expect  patient to be hospitalized for 2 midnights requiring inpatient medical care.  Patient is at high risk for adverse outcome (such as loss of life or disability) if not treated.  Indication for inpatient stay as follows:  Hemodynamic instability despite maximal medical therapy, new oxygen requirement and new hypoxia     Need for   IV medications      Level of care     tele   24H          Byard Carranza 05/31/2018, 10:38 PM    [facility]  Pager [phone]   after 2 AM please page floor coverage PA If 7AM-7PM, please contact the day team taking care of the patient  [email or web address]  Password [email or web address]

## 2018-05-31 NOTE — ED Notes (Signed)
Report called to ashely on 3e

## 2018-05-31 NOTE — ED Provider Notes (Signed)
Complains of shortness of breath with nonproductive cough for approximately a week.  Dyspnea not made worse with lying supine.  No other associated symptoms on exam speaks in paragraphs.  Lungs with rales at bases bilaterally.  Heart regular rate and rhythm   Orlie Dakin, MD 05/31/18 1424

## 2018-06-01 ENCOUNTER — Inpatient Hospital Stay (HOSPITAL_COMMUNITY): Payer: PPO

## 2018-06-01 DIAGNOSIS — I361 Nonrheumatic tricuspid (valve) insufficiency: Secondary | ICD-10-CM

## 2018-06-01 LAB — GLUCOSE, CAPILLARY
Glucose-Capillary: 117 mg/dL — ABNORMAL HIGH (ref 70–99)
Glucose-Capillary: 79 mg/dL (ref 70–99)
Glucose-Capillary: 105 mg/dL — ABNORMAL HIGH (ref 70–99)
Glucose-Capillary: 107 mg/dL — ABNORMAL HIGH (ref 70–99)

## 2018-06-01 LAB — ECHOCARDIOGRAM COMPLETE
Height: 66 in
Weight: 3968 oz

## 2018-06-01 LAB — COMPREHENSIVE METABOLIC PANEL
ALT: 35 U/L (ref 0–44)
AST: 26 U/L (ref 15–41)
Albumin: 3.2 g/dL — ABNORMAL LOW (ref 3.5–5.0)
Alkaline Phosphatase: 123 U/L (ref 38–126)
Anion gap: 13 (ref 5–15)
BUN: 15 mg/dL (ref 8–23)
CO2: 27 mmol/L (ref 22–32)
Calcium: 8.9 mg/dL (ref 8.9–10.3)
Chloride: 99 mmol/L (ref 98–111)
Creatinine, Ser: 1 mg/dL (ref 0.44–1.00)
GFR calc Af Amer: >60 mL/min (ref >60)
GFR calc non Af Amer: 56 mL/min — ABNORMAL LOW (ref >60)
Glucose, Bld: 110 mg/dL — ABNORMAL HIGH (ref 70–99)
Potassium: 3.6 mmol/L (ref 3.5–5.1)
Sodium: 139 mmol/L (ref 135–145)
Total Bilirubin: 0.8 mg/dL (ref 0.3–1.2)
Total Protein: 6.6 g/dL (ref 6.5–8.1)

## 2018-06-01 LAB — LIPID PANEL
Cholesterol: 101 mg/dL (ref 0–200)
HDL: 48 mg/dL (ref >40)
LDL Cholesterol: 39 mg/dL (ref 0–99)
Total CHOL/HDL Ratio: 2.1 RATIO
Triglycerides: 68 mg/dL (ref <150)
VLDL: 14 mg/dL (ref 0–40)

## 2018-06-01 LAB — RESPIRATORY PANEL BY PCR
Adenovirus: NOT DETECTED
Bordetella pertussis: NOT DETECTED
Chlamydophila pneumoniae: NOT DETECTED
Coronavirus 229E: NOT DETECTED
Coronavirus HKU1: NOT DETECTED
Coronavirus NL63: NOT DETECTED
Coronavirus OC43: NOT DETECTED
Influenza A: NOT DETECTED
Influenza B: NOT DETECTED
Metapneumovirus: NOT DETECTED
Mycoplasma pneumoniae: NOT DETECTED
Parainfluenza Virus 1: NOT DETECTED
Parainfluenza Virus 2: NOT DETECTED
Parainfluenza Virus 3: NOT DETECTED
Parainfluenza Virus 4: DETECTED — AB
Respiratory Syncytial Virus: NOT DETECTED
Rhinovirus / Enterovirus: NOT DETECTED

## 2018-06-01 LAB — TROPONIN I
Troponin I: <0.03 ng/mL (ref <0.03)
Troponin I: <0.03 ng/mL (ref <0.03)

## 2018-06-01 LAB — CBC
HCT: 38.1 % (ref 36.0–46.0)
Hemoglobin: 12.3 g/dL (ref 12.0–15.0)
MCH: 32.3 pg (ref 26.0–34.0)
MCHC: 32.3 g/dL (ref 30.0–36.0)
MCV: 100 fL (ref 80.0–100.0)
Platelets: 239 10*3/uL (ref 150–400)
RBC: 3.81 MIL/uL — ABNORMAL LOW (ref 3.87–5.11)
RDW: 12.9 % (ref 11.5–15.5)
WBC: 7.3 10*3/uL (ref 4.0–10.5)
nRBC: 0 % (ref 0.0–0.2)

## 2018-06-01 LAB — PHOSPHORUS: Phosphorus: 4 mg/dL (ref 2.5–4.6)

## 2018-06-01 LAB — HEMOGLOBIN A1C
Hgb A1c MFr Bld: 6 % — ABNORMAL HIGH (ref 4.8–5.6)
Mean Plasma Glucose: 125.5 mg/dL

## 2018-06-01 LAB — TSH: TSH: 1.717 u[IU]/mL (ref 0.350–4.500)

## 2018-06-01 LAB — MAGNESIUM: Magnesium: 2.3 mg/dL (ref 1.7–2.4)

## 2018-06-01 NOTE — Progress Notes (Signed)
SATURATION QUALIFICATIONS: (This note is used to comply with regulatory documentation for home oxygen)  Patient Saturations on Room Air at Rest = 91%  Patient Saturations on Room Air while Ambulating = 86%  Patient Saturations on 2 Liters of oxygen while Ambulating = 94%  Please briefly explain why patient needs home oxygen: Currently pt requiring supplemental O2 to maintain adequate O2 levels during activity. Expect this may improve as pt diuresed and recommend it be rechecked prior to DC.   [place] Pager [phone] Office [phone]

## 2018-06-01 NOTE — Progress Notes (Addendum)
PROGRESS NOTE  [name]  YTK:[id number] DOB: Feb 05, 1946 DOA: 05/31/2018 PCP: [name], MD   Brief Narrative: [name] is a 72 y.o. female with an extensive medical history including CAD s/p remote PCI to RCA, chronic HFpEF (G2DD), tachy-brady syndrome s/p PPM, OSA, T2DM, fatty liver, and HTN who presented to the ED with progressive dyspnea. She noted URI symptoms for several days with cough productive of yellow sputum for which she was seen at her PCP's office. After CXR was performed, she was given IM steroid, breathing treatment and started on 7 days of levaquin. Despite this, she continued to have increasing dyspnea over Thanksgiving for which she ultimately called EMS on 11/29. She was found to be hypoxic, up to 9#% on 3LPM O2. CTA chest showed no PE, BNP modestly elevated at 145. IV lasix was given, levaquin continued, and echocardiogram ordered. Troponins were negative and procalcitonin is undetectable. Respiratory virus panel revealed parainfluenza virus. She is improving but remains hypoxic on exertion. With here significant comorbidities, she remains at high risk of decompensation. Will continue supportive Tx and supplemental oxygen.   Assessment & Plan: Active Problems:   Obstructive sleep apnea   Essential hypertension   CAD S/P percutaneous coronary angioplasty   Paroxysmal atrial fibrillation (HCC)   BRADYCARDIA-TACHYCARDIA SYNDROME   PACEMAKER, PERMANENT   Anticoagulant long-term use   Pulmonary hypertension (HCC)   Acute on chronic diastolic congestive heart failure (HCC)   Fatty liver disease, nonalcoholic   Severe obesity (BMI >= 40) (HCC)   Acute respiratory failure with hypoxia (HCC)   HLD (hyperlipidemia)   Controlled type 2 diabetes mellitus without complication ([name])   CHF exacerbation (HCC)   Bronchitis  Acute hypoxic respiratory failure: Due to parainfluenza virus infection, CHF, possible bacterial pneumonia.  - Complete 5 days levaquin on 11/30. PCT  now reassuring.  - Wean oxygen as able over next 24 hours.   Acute on chronic HFpEF, pulmonary HTN, OSA:  - Given IV lasix which we will continue today. Weight now down below priors in office visits (115kg) at 112kg.  - Monior I/O, daily weights.  - Restart home demadex in AM. - Continue CPAP  Atrial fibrillation, tachy-brady syndrome s/p PPM: Recent successful cardioversion by Dr. Radford Pax 11/18. - Continue eliquis (CHA2DS2-VASc score is 6), even more imperative since recently cardioverted.   - Continue diltiazem, increased per cardiology recently  CAD s/p PCI w/BMS to RCA remotely, HTN: No chest pain. Troponins negative.  - Continue statin, anticoagulation.  NAFLD: Imaging suggestive of cirrhosis.  - Outpatient follow up, has seen Dr. Carlean Purl. - LFTs wnl  Hyperlipidemia: Very good lipid panel. HDL 48, LDL 39.  - Continue statin  Diet-controlled T2DM:  - Monitor CBGs, SSI prn  Morbid obesity: BMI >40.  - Lifestyle changes recommended  DVT prophylaxis: Eliquis Code Status: Full Family Communication: None at bedside Disposition Plan: Home when better, possibly in next 24 hours. She is improving but remains hypoxic on exertion. With here significant comorbidities, she remains at high risk of decompensation. Will continue supportive Tx and supplemental oxygen.   Consultants:   None  Procedures:   Echocardiogram 06/01/2018  Antimicrobials:  Levaquin   Subjective: Walked with PT in the hallways today, which she couldn't have done yesterday. Did drop to SpO2 86% and felt very short of breath, which is not her premorbid baseline. No chest pain or other complaints. Leg swelling is improved.   Objective: Vitals:   05/31/18 2030 05/31/18 2314 06/01/18 0343 06/01/18 1155  BP: (!) 137/52 (!) 118/46 (!) 121/56 (!) 126/53  Pulse: 73 74 70 75  Resp: 20 18 18 18   Temp: 98.7 F (37.1 C) 99.1 F (37.3 C) 98.7 F (37.1 C) 99.1 F (37.3 C)  TempSrc: Oral Oral Oral Oral  SpO2:  94% 91% 93% 92%  Weight:   112.5 kg   Height:        Intake/Output Summary (Last 24 hours) at 06/01/2018 1608 Last data filed at 06/01/2018 1420 Gross per 24 hour  Intake 1080 ml  Output 3700 ml  Net -2620 ml   Filed Weights   05/31/18 1957 06/01/18 0343  Weight: 113.9 kg 112.5 kg    Gen: Obese female in no distress Pulm: Non-labored breathing supplemental oxygen. Diminished at bases, scant crackles. No wheezes CV: Regular rate and rhythm. No murmur, rub, or gallop. No JVD, no significant pedal edema. GI: Abdomen soft, non-tender, non-distended, with normoactive bowel sounds. No organomegaly or masses felt. Ext: Warm, no deformities Skin: No rashes, lesions ulcers Neuro: Alert and oriented. No focal neurological deficits. Psych: Judgement and insight appear normal. Mood & affect appropriate.   Data Reviewed: I have personally reviewed following labs and imaging studies  CBC: Recent Labs  Lab 05/31/18 1159 06/01/18 0700  WBC 9.1 7.3  NEUTROABS 6.3  --   HGB 12.5 12.3  HCT 40.9 38.1  MCV 101.2* 100.0  PLT 272 616   Basic Metabolic Panel: Recent Labs  Lab 05/31/18 1159 05/31/18 1919 06/01/18 0700  NA 138  --  139  K 3.4*  --  3.6  CL 102  --  99  CO2 26  --  27  GLUCOSE 133*  --  110*  BUN 18  --  15  CREATININE 1.22*  --  1.00  CALCIUM 9.0  --  8.9  MG  --  2.2 2.3  PHOS  --   --  4.0   GFR: Estimated Creatinine Clearance: 64.7 mL/min (by C-G formula based on SCr of 1 mg/dL). Liver Function Tests: Recent Labs  Lab 06/01/18 0700  AST 26  ALT 35  ALKPHOS 123  BILITOT 0.8  PROT 6.6  ALBUMIN 3.2*   No results for input(s): LIPASE, AMYLASE in the last 168 hours. No results for input(s): AMMONIA in the last 168 hours. Coagulation Profile: No results for input(s): INR, PROTIME in the last 168 hours. Cardiac Enzymes: Recent Labs  Lab 05/31/18 1919 06/01/18 0138 06/01/18 0700  TROPONINI <0.03 <0.03 <0.03   BNP (last 3 results) No results for  input(s): PROBNP in the last 8760 hours. HbA1C: Recent Labs    06/01/18 0138  HGBA1C 6.0*   CBG: Recent Labs  Lab 05/31/18 2118 06/01/18 0840 06/01/18 1153  GLUCAP 128* 117* 79   Lipid Profile: Recent Labs    06/01/18 0138  CHOL 101  HDL 48  LDLCALC 39  TRIG 68  CHOLHDL 2.1   Thyroid Function Tests: Recent Labs    06/01/18 0138  TSH 1.717   Anemia Panel: No results for input(s): VITAMINB12, FOLATE, FERRITIN, TIBC, IRON, RETICCTPCT in the last 72 hours. Urine analysis:    Component Value Date/Time   COLORURINE YELLOW 05/31/2018 [place] 05/31/2018 1159   LABSPEC 1.011 05/31/2018 1159   PHURINE 6.0 05/31/2018 1159   GLUCOSEU NEGATIVE 05/31/2018 1159   HGBUR NEGATIVE 05/31/2018 1159   HGBUR negative 06/01/2010 [place] 05/31/2018 1159   BILIRUBINUR n 11/04/2012 [place] 05/31/2018 1159  PROTEINUR NEGATIVE 05/31/2018 1159   UROBILINOGEN 0.2 11/04/2012 1057   UROBILINOGEN 0.2 06/01/2010 0841   NITRITE NEGATIVE 05/31/2018 1159   LEUKOCYTESUR NEGATIVE 05/31/2018 1159   Recent Results (from the past 240 hour(s))  Respiratory Panel by PCR     Status: Abnormal   Collection Time: 05/31/18 12:24 AM  Result Value Ref Range Status   Adenovirus NOT DETECTED NOT DETECTED Final   Coronavirus 229E NOT DETECTED NOT DETECTED Final   Coronavirus HKU1 NOT DETECTED NOT DETECTED Final   Coronavirus NL63 NOT DETECTED NOT DETECTED Final   Coronavirus OC43 NOT DETECTED NOT DETECTED Final   Metapneumovirus NOT DETECTED NOT DETECTED Final   Rhinovirus / Enterovirus NOT DETECTED NOT DETECTED Final   Influenza A NOT DETECTED NOT DETECTED Final   Influenza B NOT DETECTED NOT DETECTED Final   Parainfluenza Virus 1 NOT DETECTED NOT DETECTED Final   Parainfluenza Virus 2 NOT DETECTED NOT DETECTED Final   Parainfluenza Virus 3 NOT DETECTED NOT DETECTED Final   Parainfluenza Virus 4 DETECTED (A) NOT DETECTED Final   Respiratory  Syncytial Virus NOT DETECTED NOT DETECTED Final   Bordetella pertussis NOT DETECTED NOT DETECTED Final   Chlamydophila pneumoniae NOT DETECTED NOT DETECTED Final   Mycoplasma pneumoniae NOT DETECTED NOT DETECTED Final    Comment: Performed at [facility], [place] [address] [place], [place] [address]      Radiology Studies: Dg Chest 2 View  Result Date: 05/31/2018 CLINICAL DATA:  Shortness of breath. EXAM: CHEST - 2 VIEW COMPARISON:  05/28/2018. FINDINGS: Cardiac pacer with lead tips over the right atrium right ventricle. Cardiomegaly with diffuse bilateral pulmonary infiltrates/edema. Findings consistent CHF. No pleural effusion. No pneumothorax. IMPRESSION: Cardiac pacer with lead tips over the right atrium right ventricle. Cardiomegaly. Diffuse bilateral pulmonary infiltrates/edema. Findings consistent CHF. Electronically Signed   By: Marcello Moores  Register   On: 05/31/2018 12:44   Ct Angio Chest Pe W/cm &/or Wo Cm  Result Date: 05/31/2018 CLINICAL DATA:  Unexplained shortness of breath over the past several days. Patient has indwelling pacemaker due to tachycardia-bradycardia syndrome. Multiple prior cardioversion 6. Current history of CHF. EXAM: CT ANGIOGRAPHY CHEST WITH CONTRAST TECHNIQUE: Multidetector CT imaging of the chest was performed using the standard protocol during bolus administration of intravenous contrast. Multiplanar CT image reconstructions and MIPs were obtained to evaluate the vascular anatomy. CONTRAST:  22mL ISOVUE-370 IOPAMIDOL INJECTION 76% IV. COMPARISON:  Visualized chest on cardiac CT 01/31/2018, 07/04/2017, 09/25/2016. CT chest 04/09/2017, 01/18/2017. FINDINGS: Cardiovascular: Contrast opacification of pulmonary arteries is very good. No filling defects within either main pulmonary artery or their segmental branches in either lung to suggest pulmonary embolism. Heart moderately enlarged. Dual lead transvenous pacemaker with the lead tips near the RIGHT atrial appendage  and at the RIGHT ventricular apex, unchanged. No significant pericardial effusion. Moderate three-vessel coronary atherosclerosis with a RIGHT coronary artery stent. Mild atherosclerosis involving the thoracic and proximal abdominal aorta without evidence of aneurysm. Mediastinum/Nodes: Conglomerate lymphadenopathy throughout the mediastinum and in both hila. These nodes have increased in size and have become more conglomerate since the prior cardiac CT 01/31/2018 (an index preaortic lymph node which is separate from the conglomerate nodes in this location currently measures approximately 1.6 x 1.3 cm (previously 1.3 x 0.8 cm). Normal appearing esophagus. Thyroid gland difficult to visualize due to beam hardening streak artifact from the battery generator pack of the indwelling pacemaker in the LEFT chest wall; that said, the visualized thyroid gland is unremarkable. Lungs/Pleura: Since the most  recent cardiac CT 01/31/2018, the patient has developed diffuse ground-glass opacity throughout both lungs with associated interlobular septal thickening as well as new more confluent consolidation peripherally in both lungs. This appearance of the lungs is also progressive since the prior high-resolution CT chest examinations in 2018. No associated pleural effusions. Central airways are diminutive but patent with bronchial wall thickening. Upper Abdomen: Irregular hepatic contour and relative enlargement of the LEFT lobe of the visualized liver. Splenic lesion with rim calcification as noted previously, unchanged. Remaining visualized upper abdomen unremarkable for the early arterial phase of enhancement. Musculoskeletal: Degenerative disc disease and spondylosis diffusely throughout the visualized LOWER cervical spine and the thoracic spine. No acute findings. Review of the MIP images confirms the above findings. IMPRESSION: 1. No evidence of pulmonary embolism. 2. Pulmonary findings which are most likely due to CHF,  though peripheral foci of confluent airspace consolidation are present in both lungs and therefore superimposed infection or inflammation may be present. A follow-up high-resolution CT after treatment of the CHF may be helpful to better characterize the lung disease. 3. Progressive mediastinal and BILATERAL hilar lymphadenopathy since the August, 2019 cardiac CT. 4. Hepatic cirrhosis is suspected. Aortic Atherosclerosis (ICD10-I70.0). Electronically Signed   By: Evangeline Dakin M.D.   On: 05/31/2018 17:07    Scheduled Meds: . apixaban  5 mg Oral BID  . atorvastatin  40 mg Oral Daily  . diltiazem  240 mg Oral Daily  . FLUoxetine  20 mg Oral Daily  . furosemide  40 mg Intravenous Q12H  . guaiFENesin  600 mg Oral BID  . insulin aspart  0-5 Units Subcutaneous QHS  . insulin aspart  0-9 Units Subcutaneous TID WC  . sodium chloride flush  3 mL Intravenous Q12H   Continuous Infusions: . sodium chloride       LOS: 1 day   Time spent: 25 minutes.  Patrecia Pour, MD [facility] [email or web address] Password [email or web address] 06/01/2018, 4:08 PM

## 2018-06-01 NOTE — Progress Notes (Signed)
  Echocardiogram 2D Echocardiogram has been performed.  [name] [name] 06/01/2018, 3:02 PM

## 2018-06-01 NOTE — Progress Notes (Signed)
OT Cancellation Note and Discharge  Patient Details Name: [name] MRN: [id number] DOB: [birth year]   Cancelled Treatment:    Reason Eval/Treat Not Completed: OT screened, no needs identified, will sign off. Got a text from PT that no OT needs were identified with this patient.  [name], OTR/L Acute Rehab Services Pager [phone] Office [phone]     [name] 06/01/2018, 11:32 AM

## 2018-06-01 NOTE — Evaluation (Signed)
Physical Therapy Evaluation Patient Details Name: [name] MRN: [id number] DOB: 1945/09/20 Today's Date: 06/01/2018   History of Present Illness  Pt adm with acute hypoxic respiratory failure likely due to CHF exacerbation. PMH - Lt TKR, DM, afib, back surgery, HTN, pacer, CVA, CKD  Clinical Impression  Pt doing well with mobility and no further PT needed.  Pt with decr SpO2 (86%) amb on RA. Expect this will improve with diuresis and recommend nursing recheck prior to dc.       Follow Up Recommendations No PT follow up    Equipment Recommendations  None recommended by PT    Recommendations for Other Services       Precautions / Restrictions Precautions Precautions: None      Mobility  Bed Mobility Overal bed mobility: Independent                Transfers Overall transfer level: Independent Equipment used: None                Ambulation/Gait Ambulation/Gait assistance: Independent Gait Distance (Feet): 400 Feet Assistive device: None Gait Pattern/deviations: WFL(Within Functional Limits) Gait velocity: normal Gait velocity interpretation: >4.37 ft/sec, indicative of normal walking speed General Gait Details: Steady gait. Began amb on RA with SpO2 dropping to 86%. Completed amb on 2L with SpO2 at 94%.  Stairs            Wheelchair Mobility    Modified Rankin (Stroke Patients Only)       Balance Overall balance assessment: No apparent balance deficits (not formally assessed)                                           Pertinent Vitals/Pain Pain Assessment: No/denies pain    Home Living Family/patient expects to be discharged to:: Private residence Living Arrangements: Spouse/significant other Available Help at Discharge: Family Type of Home: House Home Access: [place]: One [place]: Environmental consultant - 4 wheels;Cane - single point;Crutches;Shower seat      Prior Function Level of  Independence: Independent               Hand Dominance   Dominant Hand: Right    Extremity/Trunk Assessment   Upper Extremity Assessment Upper Extremity Assessment: Overall WFL for tasks assessed    Lower Extremity Assessment Lower Extremity Assessment: Overall WFL for tasks assessed       Communication   Communication: No difficulties  Cognition Arousal/Alertness: Awake/alert Behavior During Therapy: WFL for tasks assessed/performed Overall Cognitive Status: Within Functional Limits for tasks assessed                                        General Comments      Exercises     Assessment/Plan    PT Assessment Patent does not need any further PT services  PT Problem List         PT Treatment Interventions      PT Goals (Current goals can be found in the Care Plan section)  Acute Rehab PT Goals PT Goal Formulation: All assessment and education complete, DC therapy    Frequency     Barriers to discharge        Co-evaluation  AM-PAC PT "6 Clicks" Mobility  Outcome Measure Help needed turning from your back to your side while in a flat bed without using bedrails?: None Help needed moving from lying on your back to sitting on the side of a flat bed without using bedrails?: None Help needed moving to and from a bed to a chair (including a wheelchair)?: None Help needed standing up from a chair using your arms (e.g., wheelchair or bedside chair)?: None Help needed to walk in hospital room?: None Help needed climbing 3-5 steps with a railing? : None 6 Click Score: 24    End of Session Equipment Utilized During Treatment: Oxygen Activity Tolerance: Patient tolerated treatment well Patient left: in chair;with call bell/phone within reach   PT Visit Diagnosis: Other (comment)(dyspnea with amb)    Time: 3810-1751 PT Time Calculation (min) (ACUTE ONLY): 22 min   Charges:   PT Evaluation $PT Eval Low Complexity: 1  Low          [place] Pager [phone] Office [place] 06/01/2018, 10:59 AM

## 2018-06-01 NOTE — Progress Notes (Signed)
  SATURATION QUALIFICATIONS: (This note is used to comply with regulatory documentation for home oxygen)  Patient Saturations on Room Air at Rest = 94%  Patient Saturations on Room Air while Ambulating = 93 %  Patient Saturations on 0 Liters of oxygen while Ambulating =  94%  Please briefly explain why patient needs home oxygen:  No need identified

## 2018-06-01 NOTE — Discharge Instructions (Signed)

## 2018-06-01 NOTE — Plan of Care (Signed)
  Problem: Clinical Measurements: Goal: Respiratory complications will improve Outcome: Progressing   Problem: Activity: Goal: Risk for activity intolerance will decrease Outcome: Progressing   Problem: Nutrition: Goal: Adequate nutrition will be maintained Outcome: Progressing   Problem: Coping: Goal: Level of anxiety will decrease Outcome: Progressing   Problem: Elimination: Goal: Will not experience complications related to urinary retention Outcome: Progressing   Problem: Pain Managment: Goal: General experience of comfort will improve Outcome: Progressing   Problem: Safety: Goal: Ability to remain free from injury will improve Outcome: Progressing   Problem: Skin Integrity: Goal: Risk for impaired skin integrity will decrease Outcome: Progressing

## 2018-06-01 NOTE — Progress Notes (Signed)
Pt placed on CPAP of 10 with 2lt FIO2 bleed in and a full face mask. Pt tol well at this time.

## 2018-06-01 NOTE — Plan of Care (Signed)
  Problem: Education: Goal: Knowledge of General Education information will improve Description Including pain rating scale, medication(s)/side effects and non-pharmacologic comfort measures Outcome: Progressing   Problem: Health Behavior/Discharge Planning: Goal: Ability to manage health-related needs will improve Outcome: Progressing   

## 2018-06-02 LAB — BASIC METABOLIC PANEL
Anion gap: 12 (ref 5–15)
BUN: 22 mg/dL (ref 8–23)
CO2: 30 mmol/L (ref 22–32)
Calcium: 8.8 mg/dL — ABNORMAL LOW (ref 8.9–10.3)
Chloride: 98 mmol/L (ref 98–111)
Creatinine, Ser: 1.15 mg/dL — ABNORMAL HIGH (ref 0.44–1.00)
GFR calc Af Amer: 55 mL/min — ABNORMAL LOW (ref >60)
GFR calc non Af Amer: 47 mL/min — ABNORMAL LOW (ref >60)
Glucose, Bld: 109 mg/dL — ABNORMAL HIGH (ref 70–99)
Potassium: 3.6 mmol/L (ref 3.5–5.1)
Sodium: 140 mmol/L (ref 135–145)

## 2018-06-02 LAB — GLUCOSE, CAPILLARY
Glucose-Capillary: 108 mg/dL — ABNORMAL HIGH (ref 70–99)
Glucose-Capillary: 97 mg/dL (ref 70–99)

## 2018-06-02 MED ORDER — GUAIFENESIN ER 600 MG PO TB12: 600.0000 mg | ORAL_TABLET | Freq: Two times a day (BID) | ORAL | 0 refills | Status: DC

## 2018-06-03 ENCOUNTER — Telehealth: Payer: Self-pay | Admitting: *Deleted

## 2018-06-03 NOTE — Telephone Encounter (Signed)
Transition Care Management Follow-up Telephone Call   Date discharged? Discharge summary not available at this time   How have you been since you were released from the hospital? Much better    Do you understand why you were in the hospital? yes   Do you understand the discharge instructions? yes   Where were you discharged to? Home    Items Reviewed:  Medications reviewed: yes  Allergies reviewed: yes  Dietary changes reviewed:  Low sodium  Referrals reviewed: yes   Functional Questionnaire:   Activities of Daily Living (ADLs):   She states they are independent in the following: none States they require assistance with the following: none   Any transportation issues/concerns?: no   Any patient concerns? no   Confirmed importance and date/time of follow-up visits scheduled yes  Provider Appointment booked with Dr [name]  06/11/18 at 9:30 am  Confirmed with patient if condition begins to worsen call PCP or go to the ER.  Patient was given the office number and encouraged to call back with question or concerns.  : yes

## 2018-06-03 NOTE — Telephone Encounter (Signed)
TCM 1st attempt to call patient.  Line was busy. Will try at another time Patient is scheduled for 06/11/18 at 9:30 am with Dr [name]

## 2018-06-04 ENCOUNTER — Other Ambulatory Visit: Payer: Self-pay

## 2018-06-04 NOTE — Patient Outreach (Signed)
[facility]) Care Management  [place]   06/04/2018  CARNESHIA RAKER [birth year] [id number]  Successful outreach to Ms.Sartin.  HIPAA identifiers verified.   Reason for referral: 30 day post discharge medication review  Current insurance:HTA  PMHx:  H/o coronary angioplasty, atrial fibrillation, diastolic heart failure, pulmonary hypertension, cerebral aneurysm, type 2 diabetes mellitus and hyperlipidemia  HPI: Ms. Brickhouse reports that she is feeling well.  She states that she is checking her weight daily and is the same weight as when she was hospitalized.  She reports that she has diet controlled diabetes.  She states she will see her PCP on 12/10 and reports no medication affordability issues.   Objective: Lab Results  Component Value Date   CREATININE 1.15 (H) 06/02/2018   CREATININE 1.00 06/01/2018   CREATININE 1.22 (H) 05/31/2018    Lab Results  Component Value Date   HGBA1C 6.0 (H) 06/01/2018    Lipid Panel     Component Value Date/Time   CHOL 101 06/01/2018 0138   CHOL 116 10/16/2016 0850   TRIG 68 06/01/2018 0138   HDL 48 06/01/2018 0138   HDL 42 10/16/2016 0850   CHOLHDL 2.1 06/01/2018 0138   VLDL 14 06/01/2018 0138   LDLCALC 39 06/01/2018 0138   LDLCALC 46 10/16/2016 0850    BP Readings from Last 3 Encounters:  06/02/18 123/70  05/28/18 128/60  05/20/18 (!) 143/70    Allergies  Allergen Reactions  . Adhesive [Tape] Rash and Other (See Comments)    Allergic to EKG stickers and defibrillation pads.    Medications Reviewed Today    Reviewed by Toy Baker, MD (Physician) on 05/31/18 at [place] List Status: Complete  Medication Order Taking? Sig Documenting Provider Last Dose Status Informant  acetaminophen (TYLENOL) 500 MG tablet [id number] Yes Take 500 mg by mouth every morning.  [provider] 05/31/2018 Unknown time Active Self  albuterol (PROVENTIL HFA;VENTOLIN HFA) 108 (90 Base) MCG/ACT inhaler [id number]  Yes Inhale 2 puffs into the lungs every 4 (four) hours as needed for wheezing or shortness of breath. Eulas Post, MD 05/31/2018 Unknown time Active Self  Ascorbic Acid (VITAMIN C) 1000 MG tablet [id number] Yes Take 1,000 mg by mouth daily. [provider] 05/30/2018 Unknown time Active Self  atorvastatin (LIPITOR) 40 MG tablet [id number] Yes TAKE 1 TABLET BY MOUTH EVERY DAY  Patient taking differently:  Take 40 mg by mouth daily.    Nahser, Wonda Cheng, MD 05/31/2018 Unknown time Active Self  Calcium Carbonate-Vitamin D (CALCIUM 600+D PO) [id number] Yes Take 1 tablet by mouth daily. [provider] 05/30/2018 Unknown time Active Self  diltiazem (CARDIZEM CD) 240 MG 24 hr capsule [id number] Yes Take 1 capsule (240 mg total) by mouth daily. Constance Haw, MD 05/31/2018 Unknown time Active Self  diphenhydramine-acetaminophen (TYLENOL PM) 25-500 MG TABS tablet [id number] Yes Take 1 tablet by mouth at bedtime.  [provider] 05/30/2018 Unknown time Active Self  ELIQUIS 5 MG TABS tablet [id number] Yes TAKE 1 TABLET BY MOUTH TWICE A DAY  Patient taking differently:  Take 5 mg by mouth 2 (two) times daily.    Constance Haw, MD 05/31/2018 0830 Active Self  FLUoxetine (PROZAC) 20 MG capsule [id number] Yes Take 1 capsule (20 mg total) by mouth daily. [name], MD 05/31/2018 Unknown time Active Self  fluticasone (FLONASE) 50 MCG/ACT nasal spray [id number] Yes Place 1 spray into both nostrils at bedtime as needed for allergies.  [provider] Past Week Unknown time Active Self  KLOR-CON M20 20 MEQ tablet [id number] Yes TAKE 1 TABLET BY MOUTH TWICE A DAY  Patient taking differently:  Take 20 mEq by mouth 2 (two) times daily.    Nahser, Wonda Cheng, MD 05/31/2018 Unknown time Active Self  levofloxacin (LEVAQUIN) 500 MG tablet [id number] Yes Take 1 tablet (500 mg total) by mouth daily. Eulas Post, MD 05/31/2018 Unknown time Active Self           Med Note  Nash Mantis, TIFFANI S   Fri May 31, 2018  2:03 PM) 4 doses remaining  loratadine (CLARITIN) 10 MG tablet [id number] Yes TAKE 1 TABLET BY MOUTH EVERY DAY  Patient taking differently:  Take 10 mg by mouth daily.    [name], MD 05/31/2018 Unknown time Active Self  Lysine 1000 MG TABS [id number] Yes Take 1,000 mg by mouth daily at 2 PM.  [provider] 05/30/2018 Unknown time Active Self  Multiple Vitamin (MULTIVITAMIN WITH MINERALS) TABS tablet [id number] Yes Take 1 tablet by mouth daily. Centrum [provider] 05/31/2018 Unknown time Active Self  nitroGLYCERIN (NITROSTAT) 0.4 MG SL tablet [id number] Yes Place 1 tablet (0.4 mg total) under the tongue every 5 (five) minutes x 3 doses as needed for chest pain. Nahser, Wonda Cheng, MD never Active Self           Med Note Amedeo Plenty, EBONY D   Tue Oct 10, 2016  9:31 AM)    Polyethyl Glycol-Propyl Glycol (SYSTANE) 0.4-0.3 % SOLN [id number] Yes Place 1 drop into both eyes at bedtime as needed (for dry eyes).  [provider] 05/30/2018 Unknown time Active Self  torsemide (DEMADEX) 20 MG tablet [id number] Yes Take 2 tablets (40 mg total) by mouth 2 (two) times daily. Sherran Needs, NP 05/31/2018 Unknown time Active Self           Med Note Nash Mantis, TIFFANI S   Fri May 31, 2018  2:04 PM) Pt took a total of 5 tablets on Wed and Thur per MD.  valACYclovir (VALTREX) 1000 MG tablet [id number] Yes Take 1 tablet (1,000 mg total) by mouth every 12 (twelve) hours as needed (cold sores). [name], MD [place] Active Self  vitamin B-12 (CYANOCOBALAMIN) 500 MCG tablet [id number] Yes Take 500 mcg by mouth at bedtime. [provider] 05/30/2018 Unknown time Active Self  vitamin E 400 UNIT capsule [id number] Yes Take 800 Units by mouth daily at 2 PM.  [provider] 05/30/2018 Unknown time Active Self          ASSESSMENT: Date Discharged from Hospital: 06/01/18 Date Medication Reconciliation Performed: 06/04/2018  New  Medications at Discharge:   Guaifenesin  Patient was recently discharged from hospital and all medications have been reviewed.  Drugs sorted by system:  Neurologic/Psychologic: fluoxetine   Cardiovascular: atorvastatin, diltiazem, apixaban, potassium chloride, nitroglycerin, torsemide  Pulmonary/Allergy: albuterol MDI, fluticasone nasal, guaifenesin, loratadine  Topical:systane ophthalmic  Pain: acetaminophen  Infectious Diseases: valacyclovir  Vitamins/Minerals/Supplements: ascorbic acid, calcium carbonate/vitamin D, lysine, MVI, vitamin B-12, vitamin E  Miscellaneous: diphenhydramine/APAP  Medication Review Findings:  . Per the [place] List, diphenhydramine is highly anticholinergic and clearance is reduced with advanced age. Risk of confusion, dry mouth, constipation and other anticholinergic effects or toxicity may occur.  There is strong evidence to avoid use in the elderly.  PLAN: Route note to PCP, Dr [name].  Joetta Manners, PharmD Clinical Pharmacist [place] [phone]

## 2018-06-04 NOTE — Discharge Summary (Signed)
Physician Discharge Summary  [name] JSE:[id number] DOB: 09-20-1945 DOA: 05/31/2018  PCP: [name], MD  Admit date: 05/31/2018 Discharge date: 06/04/2018  Admitted From: Home Disposition: Home   Recommendations for Outpatient Follow-up:  1. Follow up with PCP in 1-2 weeks 2. Please obtain BMP/CBC in one week  Home Health: None Equipment/Devices: None Discharge Condition: Stable CODE STATUS: Full Diet recommendation: Heart healthy, carb-modified  Brief/Interim Summary: [name] is a 72 y.o. female with an extensive medical history including CAD s/p remote PCI to RCA, chronic HFpEF (G2DD), tachy-brady syndrome s/p PPM, OSA, T2DM, fatty liver, and HTN who presented to the ED with progressive dyspnea. She noted URI symptoms for several days with cough productive of yellow sputum for which she was seen at her PCP's office. After CXR was performed, she was given IM steroid, breathing treatment and started on 7 days of levaquin. Despite this, she continued to have increasing dyspnea over Thanksgiving for which she ultimately called EMS on 11/29. She was found to be hypoxic, up to 93% on 3LPM O2. CTA chest showed no PE, BNP modestly elevated at 145. IV lasix was given, levaquin continued, and echocardiogram ordered. Troponins were negative and procalcitonin is undetectable. Respiratory virus panel revealed parainfluenza virus. She ultimately improved and was discharged home.  Discharge Diagnoses:  Active Problems:   Obstructive sleep apnea   Essential hypertension   CAD S/P percutaneous coronary angioplasty   Paroxysmal atrial fibrillation (HCC)   BRADYCARDIA-TACHYCARDIA SYNDROME   PACEMAKER, PERMANENT   Anticoagulant long-term use   Pulmonary hypertension (HCC)   Acute on chronic diastolic congestive heart failure (HCC)   Fatty liver disease, nonalcoholic   Severe obesity (BMI >= 40) (HCC)   Acute respiratory failure with hypoxia (HCC)   HLD (hyperlipidemia)   Controlled  type 2 diabetes mellitus without complication ([place])   CHF exacerbation (HCC)   Bronchitis  Acute hypoxic respiratory failure: Due to parainfluenza virus infection, CHF, possible bacterial pneumonia.  - Completed 5 days levaquin on 11/30. PCT now reassuring.   Acute on chronic HFpEF, pulmonary HTN, OSA:  - Given IV lasix. Weight now down below priors in office visits (115kg) at 112kg at time of discharge. - Restart home demadex in AM. - Continue CPAP  Atrial fibrillation, tachy-brady syndrome s/p PPM: Recent successful cardioversion by Dr. Radford Pax 11/18. - Continue eliquis (CHA2DS2-VASc score is 6), even more imperative since recently cardioverted.   - Continue diltiazem, increased per cardiology recently  CAD s/p PCI w/BMS to RCA remotely, HTN: No chest pain. Troponins negative.  - Continue statin, anticoagulation.  NAFLD: Imaging suggestive of cirrhosis.  - Outpatient follow up, has seen Dr. Carlean Purl. - LFTs wnl  Hyperlipidemia: Very good lipid panel. HDL 48, LDL 39.  - Continue statin  Diet-controlled T2DM:  - Monitor CBGs, SSI prn  Morbid obesity: BMI >40.  - Lifestyle changes recommended  Discharge Instructions Discharge Instructions    (HEART FAILURE PATIENTS) Call MD:  Anytime you have any of the following symptoms: 1) 3 pound weight gain in 24 hours or 5 pounds in 1 week 2) shortness of breath, with or without a dry hacking cough 3) swelling in the hands, feet or stomach 4) if you have to sleep on extra pillows at night in order to breathe.   Complete by:  As directed    Diet - low sodium heart healthy   Complete by:  As directed    Discharge instructions   Complete by:  As directed  You will need to continue taking good care of yourself and taking all medications as directed. You may take mucinex twice daily to help break up phlegm. Your symptoms should continue to get better. If they get WORSE or you notice chest pain or bleeding or significant weight gain,  seek medical attention. Otherwise, follow up with your primary doctor in the next 1-2 weeks.   Increase activity slowly   Complete by:  As directed      Allergies as of 06/02/2018      Reactions   Adhesive [tape] Rash, Other (See Comments)   Allergic to EKG stickers and defibrillation pads.      Medication List    TAKE these medications   acetaminophen 500 MG tablet Commonly known as:  TYLENOL Take 500 mg by mouth every morning.   albuterol 108 (90 Base) MCG/ACT inhaler Commonly known as:  PROVENTIL HFA;VENTOLIN HFA Inhale 2 puffs into the lungs every 4 (four) hours as needed for wheezing or shortness of breath.   atorvastatin 40 MG tablet Commonly known as:  LIPITOR TAKE 1 TABLET BY MOUTH EVERY DAY   CALCIUM 600+D PO Take 1 tablet by mouth daily.   diltiazem 240 MG 24 hr capsule Commonly known as:  CARDIZEM CD Take 1 capsule (240 mg total) by mouth daily.   diphenhydramine-acetaminophen 25-500 MG Tabs tablet Commonly known as:  TYLENOL PM Take 1 tablet by mouth at bedtime.   ELIQUIS 5 MG Tabs tablet Generic drug:  apixaban TAKE 1 TABLET BY MOUTH TWICE A DAY What changed:  how much to take   FLUoxetine 20 MG capsule Commonly known as:  PROZAC Take 1 capsule (20 mg total) by mouth daily.   fluticasone 50 MCG/ACT nasal spray Commonly known as:  FLONASE Place 1 spray into both nostrils at bedtime as needed for allergies.   guaiFENesin 600 MG 12 hr tablet Commonly known as:  MUCINEX Take 1 tablet (600 mg total) by mouth 2 (two) times daily.   KLOR-CON M20 20 MEQ tablet Generic drug:  potassium chloride SA TAKE 1 TABLET BY MOUTH TWICE A DAY What changed:  how much to take   loratadine 10 MG tablet Commonly known as:  CLARITIN TAKE 1 TABLET BY MOUTH EVERY DAY   Lysine 1000 MG Tabs Take 1,000 mg by mouth at bedtime.   multivitamin with minerals Tabs tablet Take 1 tablet by mouth daily. Centrum   nitroGLYCERIN 0.4 MG SL tablet Commonly known as:   NITROSTAT Place 1 tablet (0.4 mg total) under the tongue every 5 (five) minutes x 3 doses as needed for chest pain.   SYSTANE 0.4-0.3 % Soln Generic drug:  Polyethyl Glycol-Propyl Glycol Place 1 drop into both eyes at bedtime as needed (for dry eyes).   torsemide 20 MG tablet Commonly known as:  DEMADEX Take 2 tablets (40 mg total) by mouth 2 (two) times daily.   valACYclovir 1000 MG tablet Commonly known as:  VALTREX Take 1 tablet (1,000 mg total) by mouth every 12 (twelve) hours as needed (cold sores).   vitamin B-12 500 MCG tablet Commonly known as:  CYANOCOBALAMIN Take 500 mcg by mouth at bedtime.   vitamin C 1000 MG tablet Take 1,000 mg by mouth daily.   vitamin E 400 UNIT capsule Take 800 Units by mouth at bedtime.       Allergies  Allergen Reactions  . Adhesive [Tape] Rash and Other (See Comments)    Allergic to EKG stickers and defibrillation pads.  Consultations:  None  Procedures/Studies: Dg Chest 2 View  Result Date: 05/31/2018 CLINICAL DATA:  Shortness of breath. EXAM: CHEST - 2 VIEW COMPARISON:  05/28/2018. FINDINGS: Cardiac pacer with lead tips over the right atrium right ventricle. Cardiomegaly with diffuse bilateral pulmonary infiltrates/edema. Findings consistent CHF. No pleural effusion. No pneumothorax. IMPRESSION: Cardiac pacer with lead tips over the right atrium right ventricle. Cardiomegaly. Diffuse bilateral pulmonary infiltrates/edema. Findings consistent CHF. Electronically Signed   By: Marcello Moores  Register   On: 05/31/2018 12:44   Dg Chest 2 View  Result Date: 05/29/2018 CLINICAL DATA:  Shortness of breath.  Decreased oxygen saturation. EXAM: CHEST - 2 VIEW COMPARISON:  CT scan of the chest of July 04, 2017 and PA and lateral chest x-ray of April 13, 2017 FINDINGS: The lungs are mildly hyperinflated. The interstitial markings are increased. The pulmonary vascularity is engorged. The cardiac silhouette is enlarged. There is no pleural  effusion. The ICD is in stable position. There is calcification in the wall of the aortic arch. IMPRESSION: CHF superimposed upon COPD. There has been mild interval deterioration in the appearance of the pulmonary vascularity since the previous chest x-ray. Thoracic aortic atherosclerosis. Electronically Signed   By: David  Martinique M.D.   On: 05/29/2018 09:05   Ct Angio Chest Pe W/cm &/or Wo Cm  Result Date: 05/31/2018 CLINICAL DATA:  Unexplained shortness of breath over the past several days. Patient has indwelling pacemaker due to tachycardia-bradycardia syndrome. Multiple prior cardioversion 6. Current history of CHF. EXAM: CT ANGIOGRAPHY CHEST WITH CONTRAST TECHNIQUE: Multidetector CT imaging of the chest was performed using the standard protocol during bolus administration of intravenous contrast. Multiplanar CT image reconstructions and MIPs were obtained to evaluate the vascular anatomy. CONTRAST:  5mL ISOVUE-370 IOPAMIDOL INJECTION 76% IV. COMPARISON:  Visualized chest on cardiac CT 01/31/2018, 07/04/2017, 09/25/2016. CT chest 04/09/2017, 01/18/2017. FINDINGS: Cardiovascular: Contrast opacification of pulmonary arteries is very good. No filling defects within either main pulmonary artery or their segmental branches in either lung to suggest pulmonary embolism. Heart moderately enlarged. Dual lead transvenous pacemaker with the lead tips near the RIGHT atrial appendage and at the RIGHT ventricular apex, unchanged. No significant pericardial effusion. Moderate three-vessel coronary atherosclerosis with a RIGHT coronary artery stent. Mild atherosclerosis involving the thoracic and proximal abdominal aorta without evidence of aneurysm. Mediastinum/Nodes: Conglomerate lymphadenopathy throughout the mediastinum and in both hila. These nodes have increased in size and have become more conglomerate since the prior cardiac CT 01/31/2018 (an index preaortic lymph node which is separate from the conglomerate nodes  in this location currently measures approximately 1.6 x 1.3 cm (previously 1.3 x 0.8 cm). Normal appearing esophagus. Thyroid gland difficult to visualize due to beam hardening streak artifact from the battery generator pack of the indwelling pacemaker in the LEFT chest wall; that said, the visualized thyroid gland is unremarkable. Lungs/Pleura: Since the most recent cardiac CT 01/31/2018, the patient has developed diffuse ground-glass opacity throughout both lungs with associated interlobular septal thickening as well as new more confluent consolidation peripherally in both lungs. This appearance of the lungs is also progressive since the prior high-resolution CT chest examinations in 2018. No associated pleural effusions. Central airways are diminutive but patent with bronchial wall thickening. Upper Abdomen: Irregular hepatic contour and relative enlargement of the LEFT lobe of the visualized liver. Splenic lesion with rim calcification as noted previously, unchanged. Remaining visualized upper abdomen unremarkable for the early arterial phase of enhancement. Musculoskeletal: Degenerative disc disease and spondylosis diffusely throughout the visualized LOWER  cervical spine and the thoracic spine. No acute findings. Review of the MIP images confirms the above findings. IMPRESSION: 1. No evidence of pulmonary embolism. 2. Pulmonary findings which are most likely due to CHF, though peripheral foci of confluent airspace consolidation are present in both lungs and therefore superimposed infection or inflammation may be present. A follow-up high-resolution CT after treatment of the CHF may be helpful to better characterize the lung disease. 3. Progressive mediastinal and BILATERAL hilar lymphadenopathy since the August, 2019 cardiac CT. 4. Hepatic cirrhosis is suspected. Aortic Atherosclerosis (ICD10-I70.0). Electronically Signed   By: Evangeline Dakin M.D.   On: 05/31/2018 17:07     Subjective: Feels better, no  hypoxia while walking. No chest pain.  Discharge Exam: Vitals:   06/02/18 0148 06/02/18 0635  BP: (!) 109/41 123/70  Pulse: 60 69  Resp: 18 18  Temp: 98.6 F (37 C) 97.6 F (36.4 C)  SpO2: 99% 93%   General: Pt is alert, awake, not in acute distress Cardiovascular: RRR, S1/S2 +, no rubs, no gallops Respiratory: CTA bilaterally, no wheezing, no rhonchi Abdominal: Soft, NT, ND, bowel sounds + Extremities: No edema, no cyanosis  Labs: BNP (last 3 results) Recent Labs    05/31/18 1200  BNP 947.0*   Basic Metabolic Panel: Recent Labs  Lab 05/31/18 1159 05/31/18 1919 06/01/18 0700 06/02/18 0550  NA 138  --  139 140  K 3.4*  --  3.6 3.6  CL 102  --  99 98  CO2 26  --  27 30  GLUCOSE 133*  --  110* 109*  BUN 18  --  15 22  CREATININE 1.22*  --  1.00 1.15*  CALCIUM 9.0  --  8.9 8.8*  MG  --  2.2 2.3  --   PHOS  --   --  4.0  --    Liver Function Tests: Recent Labs  Lab 06/01/18 0700  AST 26  ALT 35  ALKPHOS 123  BILITOT 0.8  PROT 6.6  ALBUMIN 3.2*   No results for input(s): LIPASE, AMYLASE in the last 168 hours. No results for input(s): AMMONIA in the last 168 hours. CBC: Recent Labs  Lab 05/31/18 1159 06/01/18 0700  WBC 9.1 7.3  NEUTROABS 6.3  --   HGB 12.5 12.3  HCT 40.9 38.1  MCV 101.2* 100.0  PLT 272 239   Cardiac Enzymes: Recent Labs  Lab 05/31/18 1919 06/01/18 0138 06/01/18 0700  TROPONINI <0.03 <0.03 <0.03   BNP: Invalid input(s): POCBNP CBG: Recent Labs  Lab 06/01/18 1153 06/01/18 1630 06/01/18 2054 06/02/18 0748 06/02/18 1207  GLUCAP 79 105* 107* 108* 97   D-Dimer No results for input(s): DDIMER in the last 72 hours. Hgb A1c No results for input(s): HGBA1C in the last 72 hours. Lipid Profile No results for input(s): CHOL, HDL, LDLCALC, TRIG, CHOLHDL, LDLDIRECT in the last 72 hours. Thyroid function studies No results for input(s): TSH, T4TOTAL, T3FREE, THYROIDAB in the last 72 hours.  Invalid input(s): FREET3 Anemia  work up No results for input(s): VITAMINB12, FOLATE, FERRITIN, TIBC, IRON, RETICCTPCT in the last 72 hours. Urinalysis    Component Value Date/Time   COLORURINE YELLOW 05/31/2018 [place] 05/31/2018 1159   LABSPEC 1.011 05/31/2018 1159   PHURINE 6.0 05/31/2018 1159   GLUCOSEU NEGATIVE 05/31/2018 1159   HGBUR NEGATIVE 05/31/2018 1159   HGBUR negative 06/01/2010 [place] 05/31/2018 1159   BILIRUBINUR n 11/04/2012 [place] 05/31/2018 1159  PROTEINUR NEGATIVE 05/31/2018 1159   UROBILINOGEN 0.2 11/04/2012 1057   UROBILINOGEN 0.2 06/01/2010 0841   NITRITE NEGATIVE 05/31/2018 [place] 05/31/2018 1159    Microbiology Recent Results (from the past 240 hour(s))  Respiratory Panel by PCR     Status: Abnormal   Collection Time: 05/31/18 12:24 AM  Result Value Ref Range Status   Adenovirus NOT DETECTED NOT DETECTED Final   Coronavirus 229E NOT DETECTED NOT DETECTED Final   Coronavirus HKU1 NOT DETECTED NOT DETECTED Final   Coronavirus NL63 NOT DETECTED NOT DETECTED Final   Coronavirus OC43 NOT DETECTED NOT DETECTED Final   Metapneumovirus NOT DETECTED NOT DETECTED Final   Rhinovirus / Enterovirus NOT DETECTED NOT DETECTED Final   Influenza A NOT DETECTED NOT DETECTED Final   Influenza B NOT DETECTED NOT DETECTED Final   Parainfluenza Virus 1 NOT DETECTED NOT DETECTED Final   Parainfluenza Virus 2 NOT DETECTED NOT DETECTED Final   Parainfluenza Virus 3 NOT DETECTED NOT DETECTED Final   Parainfluenza Virus 4 DETECTED (A) NOT DETECTED Final   Respiratory Syncytial Virus NOT DETECTED NOT DETECTED Final   Bordetella pertussis NOT DETECTED NOT DETECTED Final   Chlamydophila pneumoniae NOT DETECTED NOT DETECTED Final   Mycoplasma pneumoniae NOT DETECTED NOT DETECTED Final    Comment: Performed at [facility], [place] [address] [place], [place] [address]    Time coordinating discharge: Approximately 40  minutes  Patrecia Pour, MD  [facility] 06/04/2018, 7:13 PM Pager [phone]

## 2018-06-11 ENCOUNTER — Ambulatory Visit (INDEPENDENT_AMBULATORY_CARE_PROVIDER_SITE_OTHER): Payer: PPO | Admitting: Internal Medicine

## 2018-06-11 ENCOUNTER — Encounter: Payer: Self-pay | Admitting: Internal Medicine

## 2018-06-11 VITALS — BP 132/78 | HR 78 | Temp 97.9°F | Wt 254.6 lb

## 2018-06-11 DIAGNOSIS — Z79899 Other long term (current) drug therapy: Secondary | ICD-10-CM | POA: Diagnosis not present

## 2018-06-11 DIAGNOSIS — J22 Unspecified acute lower respiratory infection: Secondary | ICD-10-CM | POA: Diagnosis not present

## 2018-06-11 DIAGNOSIS — R053 Chronic cough: Secondary | ICD-10-CM

## 2018-06-11 DIAGNOSIS — R0602 Shortness of breath: Secondary | ICD-10-CM

## 2018-06-11 DIAGNOSIS — Z09 Encounter for follow-up examination after completed treatment for conditions other than malignant neoplasm: Secondary | ICD-10-CM

## 2018-06-11 DIAGNOSIS — I10 Essential (primary) hypertension: Secondary | ICD-10-CM | POA: Diagnosis not present

## 2018-06-11 DIAGNOSIS — R05 Cough: Secondary | ICD-10-CM | POA: Diagnosis not present

## 2018-06-11 DIAGNOSIS — I5033 Acute on chronic diastolic (congestive) heart failure: Secondary | ICD-10-CM | POA: Diagnosis not present

## 2018-06-11 NOTE — Patient Instructions (Addendum)
Track daily weight and  Days you take extra lasix .  Plan  Lab in 1-2 weeks BMP.  Take 80 mg lasix in am and 40 in pm for  1-  2 weeks  and lab in 1-2 weeks . IF WEIGHT IS BACK DOWN  Then go back to 40 twice a day   248 is your "dry weight"   Plan ROV   With  Dr Acie Fredrickson   ? Jan feb. . To optimize heart failure  rx .   Suspect that the cough is part due to the virus infection and  Then HF also .  I f not getting.  Breathing  consdier other  Evaluations lof lung issues .

## 2018-06-11 NOTE — Progress Notes (Signed)
6  Chief Complaint  Patient presents with  . Hospitalization Follow-up    Pt states that she is retaining fluid - just got back from vacation so the riding in the car and eating out caused her to retain fluid. Pt still coughing, yellow mucous. Some SOB. Denies chest tightness.     HPI: [name] 72 y.o. come in for follow-up post hospital discharge for respiratory difficulties felt to be from acute on chronic CHF in addition to respiratory infection question parainfluenza.  She was treated with Levaquin antibiotics and aggressive IV diuretics. She continues on sleep CPAP for her sleep apnea.  Her cough is significantly better but not resolved. She has 3 cardiologist to related to ablation and rhythm and Dr. Acie Fredrickson is her primary cardiologist. She had taken a trip to the beach family wise and had some eating that was different including increased sodium that may have caused some weight gain.  She is taken some extra steps and an extra Lasix pill and has lost 2 pounds in the last day or so.  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 She is a DNR in the hospital.   ROS: See pertinent positives and negatives per HPI.  No unusual bleeding vomiting diarrhea UTI symptoms.  Past Medical History:  Diagnosis Date  . Anticoagulant long-term use    pradaxa  . Anxiety   . Arthritis    "fingers, lower back" (04/23/2017   . CAD (coronary artery disease) 8250,5397   post PTCA with bare-metal stenting to mid RCA in December 2004     . CHF (congestive heart failure) ([place])   . Chronic atrial fibrillation 06/2007   Tachybradycardia pacemaker  . Chronic kidney disease    10% function - ?R, other kidney is compensating    . CVA (cerebral vascular accident) [facility]) [id number]   denies residual on 04/23/2017  . Depression   . Diplopia 06/19/2008   Qualifier: Diagnosis of  By: [name] MD, [name]   . Dysrhythmia    ATRIAL FIBRILATION  . Edema of lower extremity   . Hyperlipidemia   . Hypertension     . Inferior myocardial infarction [facility])    acute inferior wall mi/other medical hx  . Myocardial infarction ([place]) [id number]  . OSA on CPAP    last test- 2010  . Pacemaker   . Pneumonia 2014   tx. ----  [facility]  . Pulmonary hypertension ([place])    moderate pulmonary hypertension by 10/2016 echo and 10/2013 cardiac cath  . Shortness of breath   . Skin cancer    "cut off right [place]; burned off LLE" (04/23/2017)  . Spondylolisthesis   . TIA (transient ischemic attack) 2008  . Unspecified hemorrhoids without mention of complication 03/04/4096   Colonoscopy--Dr. Carlean Purl     Family History  Problem Relation Age of Onset  . Suicidality Father        suicide death pt was 3 yrs  . Arrhythmia Mother   . Hypertension Mother   . Diabetes Mother   . Heart attack Brother 24  . Heart disease Paternal Aunt   . Prostate cancer Maternal Grandfather   . Diabetes Paternal Grandfather        fathers side of the family    Social History   Socioeconomic History  . Marital status: Married    Spouse name: Not on file  . Number of children: 0  . Years of education: HS  . Highest education level: Not on file  Occupational History  . Occupation: retired    Comment: previously worked [employer]  . Financial resource strain: Not hard at all  . Food insecurity:    Worry: Never true    Inability: Never true  . Transportation needs:    Medical: No    Non-medical: No  Tobacco Use  . Smoking status: Former Smoker    Packs/day: 1.00    Years: 5.00    Pack years: 5.00    Types: Cigarettes    Start date: 05/11/1978    Last attempt to quit: 07/03/1982    Years since quitting: 35.9  . Smokeless tobacco: Never Used  Substance and Sexual Activity  . Alcohol use: Yes    Comment: 04/23/2017 "once q 6 months; glass of wine"  . Drug use: No  . Sexual activity: Not Currently  Lifestyle  . Physical activity:    Days per week: 0 days    Minutes per session: 0 min  . Stress:  Rather much  Relationships  . Social connections:    Talks on phone: More than three times a week    Gets together: More than three times a week    Attends religious service: More than 4 times per year    Active member of club or organization: Yes    Attends meetings of clubs or organizations: More than 4 times per year    Relationship status: Married  Other Topics Concern  . 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   Married    Originally from [place] of two, high school education   Former smoker [phone] 1ppd   Hunting dogs 7    Retired from [employer] 2     Outpatient Medications Prior to Visit  Medication Sig Dispense Refill  . acetaminophen (TYLENOL) 500 MG tablet Take 500 mg by mouth every morning.     [name] albuterol (PROVENTIL HFA;VENTOLIN HFA) 108 (90 Base) MCG/ACT inhaler Inhale 2 puffs into the lungs every 4 (four) hours as needed for wheezing or shortness of breath. 1 Inhaler 0  . Ascorbic Acid (VITAMIN C) 1000 MG tablet Take 1,000 mg by mouth daily.    [name] atorvastatin (LIPITOR) 40 MG tablet TAKE 1 TABLET BY MOUTH EVERY DAY (Patient taking differently: Take 40 mg by mouth daily. ) 90 tablet 3  . Calcium Carbonate-Vitamin D (CALCIUM 600+D PO) Take 1 tablet by mouth daily.    [name] diltiazem (CARDIZEM CD) 240 MG 24 hr capsule Take 1 capsule (240 mg total) by mouth daily. 90 capsule 3  . diphenhydramine-acetaminophen (TYLENOL PM) 25-500 MG TABS tablet Take 1 tablet by mouth at bedtime.     [name] ELIQUIS 5 MG TABS tablet TAKE 1 TABLET BY MOUTH TWICE A DAY (Patient taking differently: Take 5 mg by mouth 2 (two) times daily. ) 180 tablet 1  . FLUoxetine (PROZAC) 20 MG capsule Take 1 capsule (20 mg total) by mouth daily. 90 capsule 0  . fluticasone (FLONASE) 50 MCG/ACT nasal spray Place 1 spray into both nostrils at bedtime as needed for allergies.     [name] guaiFENesin (MUCINEX) 600 MG 12 hr  tablet Take 1 tablet (600 mg total) by mouth 2 (two) times daily. 60 tablet 0  . KLOR-CON M20 20 MEQ tablet TAKE 1 TABLET BY MOUTH TWICE A DAY (Patient taking differently: Take 20 mEq by mouth 2 (two)  times daily. ) 180 tablet 3  . loratadine (CLARITIN) 10 MG tablet TAKE 1 TABLET BY MOUTH EVERY DAY (Patient taking differently: Take 10 mg by mouth daily. ) 90 tablet 1  . Lysine 1000 MG TABS Take 1,000 mg by mouth at bedtime.     . Multiple Vitamin (MULTIVITAMIN WITH MINERALS) TABS tablet Take 1 tablet by mouth daily. Centrum    . nitroGLYCERIN (NITROSTAT) 0.4 MG SL tablet Place 1 tablet (0.4 mg total) under the tongue every 5 (five) minutes x 3 doses as needed for chest pain. 25 tablet 6  . Polyethyl Glycol-Propyl Glycol (SYSTANE) 0.4-0.3 % SOLN Place 1 drop into both eyes at bedtime as needed (for dry eyes).     . torsemide (DEMADEX) 20 MG tablet Take 2 tablets (40 mg total) by mouth 2 (two) times daily. 180 tablet 3  . valACYclovir (VALTREX) 1000 MG tablet Take 1 tablet (1,000 mg total) by mouth every 12 (twelve) hours as needed (cold sores).    . vitamin B-12 (CYANOCOBALAMIN) 500 MCG tablet Take 500 mcg by mouth at bedtime.    . vitamin E 400 UNIT capsule Take 800 Units by mouth at bedtime.      No facility-administered medications prior to visit.      EXAM:  BP 132/78 (BP Location: Right Arm, Patient Position: Sitting, Cuff Size: Large)   Pulse 78   Temp 97.9 F (36.6 C) (Oral)   Wt 254 lb 9.6 oz (115.5 kg)   BMI 41.09 kg/m   Body mass index is 41.09 kg/m.  GENERAL: vitals reviewed and listed above, alert, oriented, appears well hydrated and in no acute distress has some noted dyspnea on exertion color is good at rest. HEENT: atraumatic, conjunctiva  clear, no obvious abnormalities on inspection of external nose and ears OP : no lesion edema or exudate  NECK: no obvious masses on inspection palpation  LUNGS: clear to auscultation bilaterally, no wheezes, rales or rhonchi,  CV:  HRRR, no clubbing cyanosis or there is slight +1 peripheral edema nl cap refill  MS: moves all extremities without noticeable focal  abnormality PSYCH: pleasant and cooperative, no obvious depression or anxiety Lab Results  Component Value Date   WBC 7.3 06/01/2018   HGB 12.3 06/01/2018   HCT 38.1 06/01/2018   PLT 239 06/01/2018   GLUCOSE 109 (H) 06/02/2018   CHOL 101 06/01/2018   TRIG 68 06/01/2018   HDL 48 06/01/2018   LDLCALC 39 06/01/2018   ALT 35 06/01/2018   AST 26 06/01/2018   NA 140 06/02/2018   K 3.6 06/02/2018   CL 98 06/02/2018   CREATININE 1.15 (H) 06/02/2018   BUN 22 06/02/2018   CO2 30 06/02/2018   TSH 1.717 06/01/2018   INR 1.0 05/08/2017   HGBA1C 6.0 (H) 06/01/2018   MICROALBUR <0.7 01/23/2017   BP Readings from Last 3 Encounters:  06/11/18 132/78  06/02/18 123/70  05/28/18 128/60   Wt Readings from Last 3 Encounters:  06/11/18 254 lb 9.6 oz (115.5 kg)  06/02/18 247 lb 3.2 oz (112.1 kg)  05/28/18 255 lb (115.7 kg)     ASSESSMENT AND PLAN:  Discussed the following assessment and plan:  Hospital discharge follow-up  Acute on chronic diastolic congestive heart failure ([place]) - Plan: Basic metabolic panel  Medication management - Plan: Basic metabolic panel  Essential hypertension  Shortness of breath  Cough, persistent  Lower respiratory infection (e.g., bronchitis, pneumonia, pneumonitis, pulmonitis) -  improving  rx with levaquin empirically At  this time would work on continued diuresis that she did gain weight post hospital.  Short-term increase of her Lasix to 80 mg in the morning for 1 to 2 weeks.  Check BMP in 1 to 2 weeks. If increasing weight gain contact team.  Want her to have a follow-up visit with her primary cardiologist also.  Uncertain if she is a candidate for heart failure clinic.  She appears to be in sinus rhythm regular rhythm today.  By clinical exam. She is noted to have obstructive sleep apnea and increased pulmonary artery  peak pressure 63 on her last echo.  Of ongoing dyspnea have pulmonary involved in her respiratory evaluation. Also  -Patient advised to return or notify health care team  if  new concerns arise. Disc dnr in hopital vs   Out of hosp   No need for   Our of hosp  Form at this time   Patient Instructions  Track daily weight and  Days you take extra lasix .  Plan  Lab in 1-2 weeks BMP.  Take 80 mg lasix in am and 40 in pm for  1-  2 weeks  and lab in 1-2 weeks . IF WEIGHT IS BACK DOWN  Then go back to 40 twice a day   248 is your "dry weight"   Plan ROV   With  Dr Acie Fredrickson   ? Jan feb. . To optimize heart failure  rx .   Suspect that the cough is part due to the virus infection and  Then HF also .  I f not getting.  Breathing  consdier other  Evaluations lof lung issues .     [name]. [name] M.D.  Admit date: 05/31/2018 Discharge date: 06/04/2018  Admitted From: Home Disposition: Home   Recommendations for Outpatient Follow-up:  1. Follow up with PCP in 1-2 weeks 2. Please obtain BMP/CBC in one week  Home Health: None Equipment/Devices: None Discharge Condition: Stable CODE STATUS: Full Diet recommendation: Heart healthy, carb-modified  Brief/Interim Summary: [name] a72 y.o.femalewith an extensive medical history including CAD s/p remote PCI to RCA, chronic HFpEF (G2DD), tachy-brady syndrome s/p PPM, OSA, T2DM, fatty liver, and HTN who presented to the ED with progressive dyspnea. She noted URI symptoms for several days with cough productive of yellow sputum for which she was seen at her PCP's office. After CXR was performed, she was given IM steroid, breathing treatment and started on 7 days of levaquin. Despite this, she continued to have increasing dyspnea over Thanksgiving for which she ultimately called EMS on 11/29. She was found to be hypoxic, up to 93% on 3LPM O2. CTA chest showed no PE, BNP modestly elevated at 145. IV lasix was given, levaquin continued, and  echocardiogram ordered. Troponins were negative and procalcitonin is undetectable. Respiratory virus panel revealed parainfluenza virus. She ultimately improved and was discharged home.  Discharge Diagnoses:  Active Problems:   Obstructive sleep apnea   Essential hypertension   CAD S/P percutaneous coronary angioplasty   Paroxysmal atrial fibrillation (HCC)   BRADYCARDIA-TACHYCARDIA SYNDROME   PACEMAKER, PERMANENT   Anticoagulant long-term use   Pulmonary hypertension (HCC)   Acute on chronic diastolic congestive heart failure (HCC)   Fatty liver disease, nonalcoholic   Severe obesity (BMI >= 40) (HCC)   Acute respiratory failure with hypoxia (HCC)   HLD (hyperlipidemia)   Controlled type 2 diabetes mellitus without complication (HCC)   CHF exacerbation (HCC)   Bronchitis  Acute hypoxic respiratory failure: Due  to parainfluenza virus infection, CHF, possible bacterial pneumonia.  - Completed 5 days levaquin on 11/30. PCT now reassuring.   Acute on chronic HFpEF, pulmonary HTN, OSA:  - Given IV lasix. Weight now down below priors in office visits (115kg) at 112kg at time of discharge. - Restart home demadex in AM. - Continue CPAP  Atrial fibrillation, tachy-brady syndrome s/p PPM: Recentsuccessfulcardioversion by Dr. Radford Pax 11/18. - Continue eliquis (CHA2DS2-VASc score is 6), even more imperative since recently cardioverted.  - Continue diltiazem, increased per cardiology recently  CAD s/p PCI w/BMS to RCA remotely, HTN: No chest pain. Troponins negative.  - Continue statin, anticoagulation.  NAFLD: Imaging suggestive of cirrhosis.  - Outpatient follow up, has seen Dr. Carlean Purl. - LFTs wnl  Hyperlipidemia: Very good lipid panel. HDL 48, LDL 39.  - Continue statin  Diet-controlled T2DM:  - Monitor CBGs, SSI prn  Morbid obesity: BMI >40.  - Lifestyle changes recommended  Discharge Instructions     Discharge Instructions    (HEART FAILURE PATIENTS) Call  MD:  Anytime you have any of the following symptoms: 1) 3 pound weight gain in 24 hours or 5 pounds in 1 week 2) shortness of breath, with or without a dry hacking cough 3) swelling in the hands, feet or stomach 4) if you have to sleep on extra pillows at night in order to breathe.

## 2018-06-20 ENCOUNTER — Other Ambulatory Visit: Payer: Self-pay | Admitting: Family Medicine

## 2018-06-24 ENCOUNTER — Other Ambulatory Visit (INDEPENDENT_AMBULATORY_CARE_PROVIDER_SITE_OTHER): Payer: PPO

## 2018-06-24 DIAGNOSIS — R748 Abnormal levels of other serum enzymes: Secondary | ICD-10-CM

## 2018-06-24 DIAGNOSIS — Z79899 Other long term (current) drug therapy: Secondary | ICD-10-CM

## 2018-06-24 DIAGNOSIS — I5033 Acute on chronic diastolic (congestive) heart failure: Secondary | ICD-10-CM

## 2018-06-24 LAB — BASIC METABOLIC PANEL
BUN: 18 mg/dL (ref 6–23)
CO2: 29 mEq/L (ref 19–32)
Calcium: 8.7 mg/dL (ref 8.4–10.5)
Chloride: 97 mEq/L (ref 96–112)
Creatinine, Ser: 1.09 mg/dL (ref 0.40–1.20)
GFR: 52.39 mL/min — ABNORMAL LOW (ref >60.00)
Glucose, Bld: 112 mg/dL — ABNORMAL HIGH (ref 70–99)
Potassium: 3.4 mEq/L — ABNORMAL LOW (ref 3.5–5.1)
Sodium: 138 mEq/L (ref 135–145)

## 2018-06-24 LAB — GAMMA GT: GGT: 93 U/L — ABNORMAL HIGH (ref 7–51)

## 2018-06-24 LAB — ALKALINE PHOSPHATASE: Alkaline Phosphatase: 130 U/L — ABNORMAL HIGH (ref 39–117)

## 2018-06-27 ENCOUNTER — Other Ambulatory Visit: Payer: Self-pay | Admitting: Cardiovascular Disease

## 2018-06-28 LAB — VITAMIN D 1,25 DIHYDROXY
Vitamin D 1, 25 (OH)2 Total: 65 pg/mL (ref 18–72)
Vitamin D2 1, 25 (OH)2: <8 pg/mL
Vitamin D3 1, 25 (OH)2: 65 pg/mL

## 2018-06-28 LAB — ANTI-NUCLEAR AB-TITER (ANA TITER): ANA Titer 1: 1:80 {titer} — ABNORMAL HIGH

## 2018-06-28 LAB — MITOCHONDRIAL ANTIBODIES: Mitochondrial M2 Ab, IgG: <=20 U

## 2018-06-28 LAB — ANA: Anti Nuclear Antibody(ANA): POSITIVE — AB

## 2018-07-04 ENCOUNTER — Other Ambulatory Visit: Payer: Self-pay

## 2018-07-04 MED ORDER — DILTIAZEM HCL ER COATED BEADS 240 MG PO CP24: 240.0000 mg | ORAL_CAPSULE | Freq: Every day | ORAL | 2 refills | Status: DC

## 2018-07-12 NOTE — Addendum Note (Signed)
Addended by: Modena Morrow R on: 07/12/2018 02:54 PM   Modules accepted: Orders

## 2018-07-17 DIAGNOSIS — M4316 Spondylolisthesis, lumbar region: Secondary | ICD-10-CM | POA: Diagnosis not present

## 2018-07-22 ENCOUNTER — Other Ambulatory Visit: Payer: Self-pay | Admitting: Neurosurgery

## 2018-07-22 ENCOUNTER — Encounter: Payer: Self-pay | Admitting: Gastroenterology

## 2018-07-22 DIAGNOSIS — M4316 Spondylolisthesis, lumbar region: Secondary | ICD-10-CM

## 2018-07-26 ENCOUNTER — Ambulatory Visit
Admission: RE | Admit: 2018-07-26 | Discharge: 2018-07-26 | Disposition: A | Discharge status: Home / Self Care | Payer: PPO | Source: Ambulatory Visit | Attending: Neurosurgery | Admitting: Neurosurgery

## 2018-07-26 DIAGNOSIS — M4316 Spondylolisthesis, lumbar region: Secondary | ICD-10-CM

## 2018-07-26 DIAGNOSIS — M545 Low back pain: Secondary | ICD-10-CM | POA: Diagnosis not present

## 2018-08-01 DIAGNOSIS — Z6841 Body Mass Index (BMI) 40.0 and over, adult: Secondary | ICD-10-CM | POA: Diagnosis not present

## 2018-08-01 DIAGNOSIS — M4316 Spondylolisthesis, lumbar region: Secondary | ICD-10-CM | POA: Diagnosis not present

## 2018-08-12 ENCOUNTER — Encounter: Payer: Self-pay | Admitting: *Deleted

## 2018-08-12 ENCOUNTER — Ambulatory Visit (INDEPENDENT_AMBULATORY_CARE_PROVIDER_SITE_OTHER): Payer: PPO | Admitting: Gastroenterology

## 2018-08-12 ENCOUNTER — Other Ambulatory Visit (INDEPENDENT_AMBULATORY_CARE_PROVIDER_SITE_OTHER): Payer: PPO

## 2018-08-12 VITALS — BP 130/78 | HR 100 | Ht 65.5 in | Wt 262.0 lb

## 2018-08-12 DIAGNOSIS — R160 Hepatomegaly, not elsewhere classified: Secondary | ICD-10-CM

## 2018-08-12 DIAGNOSIS — R748 Abnormal levels of other serum enzymes: Secondary | ICD-10-CM

## 2018-08-12 DIAGNOSIS — K648 Other hemorrhoids: Secondary | ICD-10-CM | POA: Diagnosis not present

## 2018-08-12 LAB — FERRITIN: Ferritin: 54.2 ng/mL (ref 10.0–291.0)

## 2018-08-12 LAB — IRON: Iron: 109 ug/dL (ref 42–145)

## 2018-08-12 NOTE — Progress Notes (Signed)
Referring Provider: [name], MD Primary Care Physician:  [name], MD   Reason for Consultation: Abnormal liver tests   IMPRESSION:  Irregular hepatic contour and enlarged left lobe of the visualized liver on recent CTA Mildly elevated alk phos Daily acetaminophen use BMI 43 Colonoscopy with Dr. Carlean Purl 2005 Internal hemorrhoids Multiple psychosocial stressors  Recent abdominal imaging suggest the possibility of cirrhosis.  She is at risk for fatty liver in the setting of obesity, upper glycemia and hyperlipidemia.  She is also at risk for congestive hepatopathy.  Other causes of treatable liver disease should be identified.  Patient declines any endoscopic evaluation.  She feels she has too many medical problems and does not feel it is safe to discontinue her blood thinners.  PLAN: - Elastography - Labs today to exclude treatable chronic liver disease: IgG, ANA, Smooth muscle antibody, ferritin, iron, AMA, IgM - Asked to discuss alternatives to acetaminophen for pain management with her primary care doctor  - Return in 1-2 months or earlier as needed    HPI: [name] is a 73 y.o. female seen in consultation for abnormal liver test.  History is obtained to the patient and review of her electronic health record.  Has an extensive medical history including CAD s/p remote PCI to RCA, chronic HFpEF (G2DD), tachy-brady syndrome s/p PPM, OSA, T2DM, fatty liver on prior imaging, and HTN.  She is referred for abnormal liver enzymes.  However the only elevated enzyme that I am able to identify is a mildly elevated alk phos at 130.  AST is normal. ALT is normal. Alk phos is elevated at 130.  Total bilirubin 0.8.  Platelets 239 HCV Ab negative 01/23/17 PT and INR were normal 05/08/2017  However a CT angio chest PE 05/31/18 showed irregular hepatic contour and relative enlargement of the LEFT lobe of the visualized liver.  She has no symptoms that she attributes to  liver disease.  No identified exacerbating or relieving features.  She feels she has too many medical problems to be concerned about this finding.  He has been taking acetaminophen 1g BID for chronic pain. Unable to take a lower dose.   No prior blood donation.  No prior blood transfusion.  No history of jaundice or scleral icterus.  No history of use or experimentation with IV or intranasal street drugs.  No history of autoimmune disease.  No family history of liver disease.  Maternal aunt with colon cancer - alive at 59. No other known family history of colon cancer or polyps. Mother is 71 with dementia. Multiple stressors caring for her mother, who no longer recognizes her.    Past Medical History:  Diagnosis Date  . Anticoagulant long-term use    pradaxa  . Anxiety   . Arthritis    "fingers, lower back" (04/23/2017   . CAD (coronary artery disease) 4481,8563   post PTCA with bare-metal stenting to mid RCA in December 2004     . CHF (congestive heart failure) ([place])   . Chronic atrial fibrillation 06/2007   Tachybradycardia pacemaker  . Chronic kidney disease    10% function - ?R, other kidney is compensating    . CVA (cerebral vascular accident) [facility]) [id number]   denies residual on 04/23/2017  . Depression   . Diplopia 06/19/2008   Qualifier: Diagnosis of  By: [name] MD, [name]   . Dysrhythmia    ATRIAL FIBRILATION  . Edema of lower extremity   . Hyperlipidemia   .  Hypertension   . Inferior myocardial infarction [facility])    acute inferior wall mi/other medical hx  . Myocardial infarction ([place]) [id number]  . Obesity   . OSA on CPAP    last test- 2010  . Pacemaker   . Pneumonia 2014   tx. ----  [facility]  . Pulmonary hypertension ([place])    moderate pulmonary hypertension by 10/2016 echo and 10/2013 cardiac cath  . Shortness of breath   . Skin cancer    "cut off right [place]; burned off LLE" (04/23/2017)  . Sleep apnea   . Spondylolisthesis   . TIA (transient ischemic  attack) 2008  . Unspecified hemorrhoids without mention of complication 4/40/3474   Colonoscopy--Dr. Carlean Purl     Past Surgical History:  Procedure Laterality Date  . ABDOMINAL HYSTERECTOMY    . APPENDECTOMY  1984  . ATRIAL FIBRILLATION ABLATION N/A 09/29/2016   Procedure: Atrial Fibrillation Ablation;  Surgeon: Will Meredith Leeds, MD;  Location: [place] CV LAB;  Service: Cardiovascular;  Laterality: N/A;  . ATRIAL FIBRILLATION ABLATION N/A 02/07/2018   Procedure: ATRIAL FIBRILLATION ABLATION;  Surgeon: Constance Haw, MD;  Location: [place] CV LAB;  Service: Cardiovascular;  Laterality: N/A;  . BACK SURGERY    . CARDIOVERSION N/A 09/12/2017   Procedure: CARDIOVERSION;  Surgeon: Jerline Pain, MD;  Location: [facility] ENDOSCOPY;  Service: Cardiovascular;  Laterality: N/A;  . CARDIOVERSION N/A 12/13/2017   Procedure: CARDIOVERSION;  Surgeon: Sanda Klein, MD;  Location: [place] ENDOSCOPY;  Service: Cardiovascular;  Laterality: N/A;  . CARDIOVERSION N/A 02/18/2018   Procedure: CARDIOVERSION;  Surgeon: Dorothy Spark, MD;  Location[facility] ENDOSCOPY;  Service: Cardiovascular;  Laterality: N/A;  . CARDIOVERSION N/A 05/20/2018   Procedure: CARDIOVERSION;  Surgeon: [name], MD;  Location: [facility] ENDOSCOPY;  Service: Cardiovascular;  Laterality: N/A;  . CATARACT EXTRACTION W/ INTRAOCULAR LENS  IMPLANT, BILATERAL Bilateral 01/15/2017- 03/2017  . CHOLECYSTECTOMY    . CORONARY ANGIOPLASTY  X 2  . CORONARY ANGIOPLASTY WITH STENT PLACEMENT  1998; ~ 2007; ?date   "1 stent; replaced stent; not sure when I got the last stent" (04/23/2017)  . DOPPLER ECHOCARDIOGRAPHY  2009  . ELECTROPHYSIOLOGIC STUDY N/A 03/31/2016   Procedure: Cardioversion;  Surgeon: Evans Lance, MD;  Location: [place] CV LAB;  Service: Cardiovascular;  Laterality: N/A;  . ELECTROPHYSIOLOGIC STUDY N/A 08/04/2016   Procedure: Cardioversion;  Surgeon: Evans Lance, MD;  Location: [place] CV LAB;  Service: Cardiovascular;   Laterality: N/A;  . INSERT / REPLACE / REMOVE PACEMAKER  06/2007  . IR RADIOLOGY PERIPHERAL GUIDED IV START  01/31/2018  . IR US GUIDE VASC ACCESS LEFT  01/31/2018  . JOINT REPLACEMENT    . LAPAROSCOPIC CHOLECYSTECTOMY  1994  . LEFT AND RIGHT HEART CATHETERIZATION WITH CORONARY ANGIOGRAM N/A 10/06/2013   Procedure: LEFT AND RIGHT HEART CATHETERIZATION WITH CORONARY ANGIOGRAM;  Surgeon: Troy Sine, MD;  Location: [facility] CATH LAB;  Service: Cardiovascular;  Laterality: N/A;  . LEFT OOPHORECTOMY Left ~ 1989  . POSTERIOR LUMBAR FUSION  2000s - 04/2015 X 3   L3-4; L4-5; L2-3; Dr Trenton Gammon  . RIGHT/LEFT HEART CATH AND CORONARY ANGIOGRAPHY N/A 05/09/2017   Procedure: RIGHT/LEFT HEART CATH AND CORONARY ANGIOGRAPHY;  Surgeon: Sherren Mocha, MD;  Location: [place] CV LAB;  Service: Cardiovascular;  Laterality: N/A;  . SKIN CANCER EXCISION Right    [place]  . TEE WITHOUT CARDIOVERSION N/A 09/29/2016   Procedure: TRANSESOPHAGEAL ECHOCARDIOGRAM (TEE);  Surgeon: Jerline Pain, MD;  Location:  [facility] ENDOSCOPY;  Service: Cardiovascular;  Laterality: N/A;  . TOTAL ABDOMINAL HYSTERECTOMY  1984   "uterus & right ovary"  . TOTAL KNEE ARTHROPLASTY Left 04/23/2017  . TOTAL KNEE ARTHROPLASTY Left 04/23/2017   Procedure: TOTAL KNEE ARTHROPLASTY;  Surgeon: Frederik Pear, MD;  Location: [place];  Service: Orthopedics;  Laterality: Left;  . ULTRASOUND GUIDANCE FOR VASCULAR ACCESS  05/09/2017   Procedure: Ultrasound Guidance For Vascular Access;  Surgeon: Sherren Mocha, MD;  Location: [place] CV LAB;  Service: Cardiovascular;;    Current Outpatient Medications  Medication Sig Dispense Refill  . acetaminophen (TYLENOL) 500 MG tablet Take 1,000 mg by mouth 2 (two) times daily.     [name] albuterol (PROVENTIL HFA;VENTOLIN HFA) 108 (90 Base) MCG/ACT inhaler INHALE 2 PUFFS INTO THE LUNGS EVERY 4 (FOUR) HOURS AS NEEDED FOR WHEEZING OR SHORTNESS OF BREATH. 6.7 Inhaler 0  . Ascorbic Acid (VITAMIN C) 1000 MG tablet Take 1,000 mg by mouth  daily.    [name] atorvastatin (LIPITOR) 40 MG tablet TAKE 1 TABLET BY MOUTH EVERY DAY (Patient taking differently: Take 40 mg by mouth daily. ) 90 tablet 3  . Calcium Carbonate-Vitamin D (CALCIUM 600+D PO) Take 1 tablet by mouth daily.    [name] diltiazem (CARDIZEM CD) 240 MG 24 hr capsule Take 1 capsule (240 mg total) by mouth daily. 90 capsule 2  . diphenhydramine-acetaminophen (TYLENOL PM) 25-500 MG TABS tablet Take 2 tablets by mouth at bedtime.     [name] ELIQUIS 5 MG TABS tablet TAKE 1 TABLET BY MOUTH TWICE A DAY (Patient taking differently: Take 5 mg by mouth 2 (two) times daily. ) 180 tablet 1  . FLUoxetine (PROZAC) 20 MG capsule Take 1 capsule (20 mg total) by mouth daily. 90 capsule 0  . fluticasone (FLONASE) 50 MCG/ACT nasal spray Place 1 spray into both nostrils at bedtime as needed for allergies.     [name] guaiFENesin (MUCINEX) 600 MG 12 hr tablet Take 1 tablet (600 mg total) by mouth 2 (two) times daily. (Patient taking differently: Take 600 mg by mouth as needed. ) 60 tablet 0  . KLOR-CON M20 20 MEQ tablet TAKE 1 TABLET BY MOUTH TWICE A DAY (Patient taking differently: Take 20 mEq by mouth 2 (two) times daily. ) 180 tablet 3  . loratadine (CLARITIN) 10 MG tablet TAKE 1 TABLET BY MOUTH EVERY DAY (Patient taking differently: Take 10 mg by mouth daily. ) 90 tablet 1  . Lysine 1000 MG TABS Take 1,000 mg by mouth at bedtime.     . Multiple Vitamin (MULTIVITAMIN WITH MINERALS) TABS tablet Take 1 tablet by mouth daily. Centrum    . nitroGLYCERIN (NITROSTAT) 0.4 MG SL tablet Place 1 tablet (0.4 mg total) under the tongue every 5 (five) minutes x 3 doses as needed for chest pain. 25 tablet 6  . Polyethyl Glycol-Propyl Glycol (SYSTANE) 0.4-0.3 % SOLN Place 1 drop into both eyes at bedtime as needed (for dry eyes).     . torsemide (DEMADEX) 20 MG tablet Take 2 tablets (40 mg total) by mouth 2 (two) times daily. 180 tablet 3  . valACYclovir (VALTREX) 1000 MG tablet Take 1 tablet (1,000 mg total) by mouth every 12  (twelve) hours as needed (cold sores).    . vitamin B-12 (CYANOCOBALAMIN) 500 MCG tablet Take 500 mcg by mouth at bedtime.    . vitamin E 400 UNIT capsule Take 800 Units by mouth at bedtime.      No current facility-administered medications for this visit.  Allergies as of 08/12/2018 - Review Complete 08/12/2018  Allergen Reaction Noted  . Adhesive [tape] Rash and Other (See Comments) 09/29/2016    Family History  Problem Relation Age of Onset  . Suicidality Father        suicide death pt was 3 yrs, 6  . Arrhythmia Mother   . Hypertension Mother   . Diabetes Mother   . Dementia Mother   . Heart attack Brother   . Heart disease Paternal Aunt   . Prostate cancer Maternal Grandfather   . Diabetes Paternal Grandfather        fathers side of the family  . Colon cancer Maternal Aunt     Social History   Socioeconomic History  . Marital status: Married    Spouse name: Not on file  . Number of children: 0  . Years of education: HS  . Highest education level: Not on file  Occupational History  . Occupation: retired    Comment: previously worked [employer]  . Financial resource strain: Not hard at all  . Food insecurity:    Worry: Never true    Inability: Never true  . Transportation needs:    Medical: No    Non-medical: No  Tobacco Use  . Smoking status: Former Smoker    Packs/day: 1.00    Years: 5.00    Pack years: 5.00    Types: Cigarettes    Start date: 05/11/1978    Last attempt to quit: 07/03/1982    Years since quitting: 36.1  . Smokeless tobacco: Never Used  Substance and Sexual Activity  . Alcohol use: Yes    Comment: 04/23/2017 "once q 6 months; glass of wine"  . Drug use: No  . Sexual activity: Not Currently  Lifestyle  . Physical activity:    Days per week: 0 days    Minutes per session: 0 min  . Stress: Rather much  Relationships  . Social connections:    Talks on phone: More than three times a week    Gets together: More  than three times a week    Attends religious service: More than 4 times per year    Active member of club or organization: Yes    Attends meetings of clubs or organizations: More than 4 times per year    Relationship status: Married  . Intimate partner violence:    Fear of current or ex partner: No    Emotionally abused: No    Physically abused: No    Forced sexual activity: No  Other Topics Concern  . 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   Married    Originally from [place] of two, high school education   Former smoker [phone] 1ppd   Hunting dogs 7    Retired from [employer] 2     Review of Systems: 12 system ROS is negative except as noted above with the additions of allergies, back pain, cough, itching, night sweats.  Filed Weights   08/12/18 1342  Weight: 262 lb (118.8 kg)    Physical Exam: Vital signs were reviewed.  General:   Alert, in NAD. No scleral icterus. No bilateral temporal wasting.  Heart:  Regular rate and rhythm; no murmurs Pulm: Clear anteriorly; no wheezing Abdomen:  Soft. Nontender.  Central obesity.  Nondistended. Normal bowel sounds. No rebound or guarding. No fluid  wave.  LAD: No inguinal or umbilical LAD Extremities:  Without edema. Neurologic:  Alert and  oriented x4;  grossly normal neurologically; no asterixis or clonus. Skin: No jaundice. No palmar erythema or spider angioma.  Psych:  Alert and cooperative. Normal mood and affect.  Orland Visconti L. Tarri Glenn, MD, MPH [facility] 08/12/2018, 2:22 PM

## 2018-08-12 NOTE — Patient Instructions (Signed)
Your provider has requested that you go to the basement level for lab work before leaving today. Press "B" on the elevator. The lab is located at the first door on the left as you exit the elevator.  You have been scheduled for an abdominal ultrasound at [facility] Radiology (1st floor of hospital) on 08-16-2018 at 10:00 am. Please arrive 15 minutes prior to your appointment for registration. Make certain not to have anything to eat or drink 6 hours prior to your appointment. Should you need to reschedule your appointment, please contact radiology at [phone]. This test typically takes about 30 minutes to perform.

## 2018-08-15 DIAGNOSIS — Z6841 Body Mass Index (BMI) 40.0 and over, adult: Secondary | ICD-10-CM | POA: Diagnosis not present

## 2018-08-15 DIAGNOSIS — M47816 Spondylosis without myelopathy or radiculopathy, lumbar region: Secondary | ICD-10-CM | POA: Diagnosis not present

## 2018-08-15 DIAGNOSIS — I4891 Unspecified atrial fibrillation: Secondary | ICD-10-CM | POA: Diagnosis not present

## 2018-08-15 LAB — ANA: Anti Nuclear Antibody(ANA): POSITIVE — AB

## 2018-08-15 LAB — ANTI-SMOOTH MUSCLE ANTIBODY, IGG: Actin (Smooth Muscle) Antibody (IGG): <20 U (ref <20)

## 2018-08-15 LAB — IGM: IgM, Serum: 155 mg/dL (ref 50–300)

## 2018-08-15 LAB — ANTI-NUCLEAR AB-TITER (ANA TITER): ANA Titer 1: 1:80 {titer} — ABNORMAL HIGH

## 2018-08-15 LAB — IGG: IgG (Immunoglobin G), Serum: 1141 mg/dL (ref 600–1540)

## 2018-08-16 ENCOUNTER — Telehealth: Payer: Self-pay | Admitting: Cardiology

## 2018-08-16 ENCOUNTER — Ambulatory Visit: Payer: PPO | Admitting: Cardiology

## 2018-08-16 ENCOUNTER — Ambulatory Visit (HOSPITAL_COMMUNITY)
Admission: RE | Admit: 2018-08-16 | Discharge: 2018-08-16 | Disposition: A | Discharge status: Home / Self Care | Payer: PPO | Source: Ambulatory Visit | Attending: Gastroenterology | Admitting: Gastroenterology

## 2018-08-16 ENCOUNTER — Other Ambulatory Visit: Payer: Self-pay

## 2018-08-16 ENCOUNTER — Encounter: Payer: Self-pay | Admitting: Cardiology

## 2018-08-16 ENCOUNTER — Other Ambulatory Visit: Payer: Self-pay | Admitting: Nurse Practitioner

## 2018-08-16 VITALS — BP 124/68 | HR 74 | Ht 65.5 in | Wt 262.4 lb

## 2018-08-16 DIAGNOSIS — R160 Hepatomegaly, not elsewhere classified: Secondary | ICD-10-CM

## 2018-08-16 DIAGNOSIS — K648 Other hemorrhoids: Secondary | ICD-10-CM

## 2018-08-16 DIAGNOSIS — I4819 Other persistent atrial fibrillation: Secondary | ICD-10-CM | POA: Diagnosis not present

## 2018-08-16 DIAGNOSIS — R748 Abnormal levels of other serum enzymes: Secondary | ICD-10-CM

## 2018-08-16 MED ORDER — NITROGLYCERIN 0.4 MG SL SUBL: 0.4000 mg | SUBLINGUAL_TABLET | SUBLINGUAL | 6 refills | Status: DC | PRN

## 2018-08-16 NOTE — Progress Notes (Signed)
Electrophysiology Office Note   Date:  08/16/2018   ID:  AUSET FRITZLER, DOB [birth year], MRN [id number]  PCP:  [name], MD  Primary Electrophysiologist: Gaye Alken, MD    No chief complaint on file.    History of Present Illness: [name] is a 73 y.o. female who presents today for electrophysiology evaluation.   She has a history of coronary artery disease status post stent to the RCA in 2004, CHF, atrial fibrillation with tachybradycardia syndrome status post pacemaker, CKD, hypertension, hyperlipidemia, inferior wall MI, sleep apnea on CPAP, stroke. She is maintaining sinus rhythm on CPAP and dofetilide until she got an infection and when in atrial fibrillation. Had ablation on 09/29/16.  Unfortunately, over the last few months, she has required 2 cardioversions.  She had repeat ablation 02/07/2018.  Since that time, she has had both recurrent atrial fibrillation and atypical atrial flutter.  Today, denies symptoms of palpitations, chest pain, shortness of breath, orthopnea, PND, lower extremity edema, claudication, dizziness, presyncope, syncope, bleeding, or neurologic sequela. The patient is tolerating medications without difficulties.  Unfortunately after her most recent cardioversion, she is gone back into atrial fibrillation.  This occurred at the beginning of January.  She has weakness, fatigue, and shortness of breath.   Past Medical History:  Diagnosis Date  . Anticoagulant long-term use    pradaxa  . Anxiety   . Arthritis    "fingers, lower back" (04/23/2017   . CAD (coronary artery disease) 7026,3785   post PTCA with bare-metal stenting to mid RCA in December 2004     . CHF (congestive heart failure) ([place])   . Chronic atrial fibrillation 06/2007   Tachybradycardia pacemaker  . Chronic kidney disease    10% function - ?R, other kidney is compensating    . CVA (cerebral vascular accident) [facility]) [id number]   denies residual on 04/23/2017  .  Depression   . Diplopia 06/19/2008   Qualifier: Diagnosis of  By: [name] MD, [name]   . Dysrhythmia    ATRIAL FIBRILATION  . Edema of lower extremity   . Hyperlipidemia   . Hypertension   . Inferior myocardial infarction [facility])    acute inferior wall mi/other medical hx  . Myocardial infarction ([place]) [id number]  . Obesity   . OSA on CPAP    last test- 2010  . Pacemaker   . Pneumonia 2014   tx. ----  [facility]  . Pulmonary hypertension ([place])    moderate pulmonary hypertension by 10/2016 echo and 10/2013 cardiac cath  . Shortness of breath   . Skin cancer    "cut off right [place]; burned off LLE" (04/23/2017)  . Sleep apnea   . Spondylolisthesis   . TIA (transient ischemic attack) 2008  . Unspecified hemorrhoids without mention of complication 07/30/7865   Colonoscopy--Dr. Carlean Purl    Past Surgical History:  Procedure Laterality Date  . ABDOMINAL HYSTERECTOMY    . APPENDECTOMY  1984  . ATRIAL FIBRILLATION ABLATION N/A 09/29/2016   Procedure: Atrial Fibrillation Ablation;  Surgeon: Will Meredith Leeds, MD;  Location: [place] CV LAB;  Service: Cardiovascular;  Laterality: N/A;  . ATRIAL FIBRILLATION ABLATION N/A 02/07/2018   Procedure: ATRIAL FIBRILLATION ABLATION;  Surgeon: Constance Haw, MD;  Location: [place] CV LAB;  Service: Cardiovascular;  Laterality: N/A;  . BACK SURGERY    . CARDIOVERSION N/A 09/12/2017   Procedure: CARDIOVERSION;  Surgeon: Jerline Pain, MD;  Location: [place];  Service: Cardiovascular;  Laterality: N/A;  . CARDIOVERSION N/A 12/13/2017   Procedure: CARDIOVERSION;  Surgeon: Sanda Klein, MD;  Location: [place] ENDOSCOPY;  Service: Cardiovascular;  Laterality: N/A;  . CARDIOVERSION N/A 02/18/2018   Procedure: CARDIOVERSION;  Surgeon: Dorothy Spark, MD;  Location: [facility] ENDOSCOPY;  Service: Cardiovascular;  Laterality: N/A;  . CARDIOVERSION N/A 05/20/2018   Procedure: CARDIOVERSION;  Surgeon: [name], MD;  Location: [facility] ENDOSCOPY;   Service: Cardiovascular;  Laterality: N/A;  . CATARACT EXTRACTION W/ INTRAOCULAR LENS  IMPLANT, BILATERAL Bilateral 01/15/2017- 03/2017  . CHOLECYSTECTOMY    . CORONARY ANGIOPLASTY  X 2  . CORONARY ANGIOPLASTY WITH STENT PLACEMENT  1998; ~ 2007; ?date   "1 stent; replaced stent; not sure when I got the last stent" (04/23/2017)  . DOPPLER ECHOCARDIOGRAPHY  2009  . ELECTROPHYSIOLOGIC STUDY N/A 03/31/2016   Procedure: Cardioversion;  Surgeon: Evans Lance, MD;  Location: [place] CV LAB;  Service: Cardiovascular;  Laterality: N/A;  . ELECTROPHYSIOLOGIC STUDY N/A 08/04/2016   Procedure: Cardioversion;  Surgeon: Evans Lance, MD;  Location: [place] CV LAB;  Service: Cardiovascular;  Laterality: N/A;  . INSERT / REPLACE / REMOVE PACEMAKER  06/2007  . IR RADIOLOGY PERIPHERAL GUIDED IV START  01/31/2018  . IR US GUIDE VASC ACCESS LEFT  01/31/2018  . JOINT REPLACEMENT    . LAPAROSCOPIC CHOLECYSTECTOMY  1994  . LEFT AND RIGHT HEART CATHETERIZATION WITH CORONARY ANGIOGRAM N/A 10/06/2013   Procedure: LEFT AND RIGHT HEART CATHETERIZATION WITH CORONARY ANGIOGRAM;  Surgeon: Troy Sine, MD;  Location: [facility] CATH LAB;  Service: Cardiovascular;  Laterality: N/A;  . LEFT OOPHORECTOMY Left ~ 1989  . POSTERIOR LUMBAR FUSION  2000s - 04/2015 X 3   L3-4; L4-5; L2-3; Dr Trenton Gammon  . RIGHT/LEFT HEART CATH AND CORONARY ANGIOGRAPHY N/A 05/09/2017   Procedure: RIGHT/LEFT HEART CATH AND CORONARY ANGIOGRAPHY;  Surgeon: Sherren Mocha, MD;  Location: [place] CV LAB;  Service: Cardiovascular;  Laterality: N/A;  . SKIN CANCER EXCISION Right    [place]  . TEE WITHOUT CARDIOVERSION N/A 09/29/2016   Procedure: TRANSESOPHAGEAL ECHOCARDIOGRAM (TEE);  Surgeon: Jerline Pain, MD;  Location: [place];  Service: Cardiovascular;  Laterality: N/A;  . TOTAL ABDOMINAL HYSTERECTOMY  1984   "uterus & right ovary"  . TOTAL KNEE ARTHROPLASTY Left 04/23/2017  . TOTAL KNEE ARTHROPLASTY Left 04/23/2017   Procedure: TOTAL KNEE  ARTHROPLASTY;  Surgeon: Frederik Pear, MD;  Location: [place];  Service: Orthopedics;  Laterality: Left;  . ULTRASOUND GUIDANCE FOR VASCULAR ACCESS  05/09/2017   Procedure: Ultrasound Guidance For Vascular Access;  Surgeon: Sherren Mocha, MD;  Location: [place] CV LAB;  Service: Cardiovascular;;     Current Outpatient Medications  Medication Sig Dispense Refill  . acetaminophen (TYLENOL) 500 MG tablet Take 1,000 mg by mouth 2 (two) times daily.     [name] albuterol (PROVENTIL HFA;VENTOLIN HFA) 108 (90 Base) MCG/ACT inhaler INHALE 2 PUFFS INTO THE LUNGS EVERY 4 (FOUR) HOURS AS NEEDED FOR WHEEZING OR SHORTNESS OF BREATH. 6.7 Inhaler 0  . Ascorbic Acid (VITAMIN C) 1000 MG tablet Take 1,000 mg by mouth daily.    [name] atorvastatin (LIPITOR) 40 MG tablet TAKE 1 TABLET BY MOUTH EVERY DAY (Patient taking differently: Take 40 mg by mouth daily. ) 90 tablet 3  . Calcium Carbonate-Vitamin D (CALCIUM 600+D PO) Take 1 tablet by mouth daily.    [name] diltiazem (CARDIZEM CD) 240 MG 24 hr capsule Take 1 capsule (240 mg total) by mouth daily. 90 capsule 2  .  diphenhydramine-acetaminophen (TYLENOL PM) 25-500 MG TABS tablet Take 2 tablets by mouth at bedtime.     [name] ELIQUIS 5 MG TABS tablet TAKE 1 TABLET BY MOUTH TWICE A DAY (Patient taking differently: Take 5 mg by mouth 2 (two) times daily. ) 180 tablet 1  . FLUoxetine (PROZAC) 20 MG capsule Take 1 capsule (20 mg total) by mouth daily. 90 capsule 0  . fluticasone (FLONASE) 50 MCG/ACT nasal spray Place 1 spray into both nostrils at bedtime as needed for allergies.     [name] KLOR-CON M20 20 MEQ tablet TAKE 1 TABLET BY MOUTH TWICE A DAY (Patient taking differently: Take 20 mEq by mouth 2 (two) times daily. ) 180 tablet 3  . loratadine (CLARITIN) 10 MG tablet TAKE 1 TABLET BY MOUTH EVERY DAY (Patient taking differently: Take 10 mg by mouth daily. ) 90 tablet 1  . Lysine 1000 MG TABS Take 1,000 mg by mouth at bedtime.     . Multiple Vitamin (MULTIVITAMIN WITH MINERALS) TABS tablet  Take 1 tablet by mouth daily. Centrum    . nitroGLYCERIN (NITROSTAT) 0.4 MG SL tablet Place 1 tablet (0.4 mg total) under the tongue every 5 (five) minutes x 3 doses as needed for chest pain. 25 tablet 6  . Polyethyl Glycol-Propyl Glycol (SYSTANE) 0.4-0.3 % SOLN Place 1 drop into both eyes at bedtime as needed (for dry eyes).     . valACYclovir (VALTREX) 1000 MG tablet Take 1 tablet (1,000 mg total) by mouth every 12 (twelve) hours as needed (cold sores).    . vitamin B-12 (CYANOCOBALAMIN) 500 MCG tablet Take 500 mcg by mouth at bedtime.    . vitamin E 400 UNIT capsule Take 800 Units by mouth at bedtime.     . torsemide (DEMADEX) 20 MG tablet Take 2 tablets (40 mg total) by mouth 2 (two) times daily. 180 tablet 3   No current facility-administered medications for this visit.     Allergies:   Adhesive [tape]   Social History:  The patient  reports that she quit smoking about 36 years ago. Her smoking use included cigarettes. She started smoking about 40 years ago. She has a 5.00 pack-year smoking history. She has never used smokeless tobacco. She reports current alcohol use. She reports that she does not use drugs.   Family History:  The patient's family history includes Arrhythmia in her mother; Colon cancer in her maternal aunt; Dementia in her mother; Diabetes in her mother and paternal grandfather; Heart attack in her brother; Heart disease in her paternal aunt; Hypertension in her mother; Prostate cancer in her maternal grandfather; Suicidality in her father.   ROS:  Please see the history of present illness.   Otherwise, review of systems is positive for weight change, leg swelling, palpitations, back pain.   All other systems are reviewed and negative.   PHYSICAL EXAM: VS:  BP 124/68   Pulse 74   Ht 5' 5.5" (1.664 m)   Wt 262 lb 6.4 oz (119 kg)   SpO2 92%   BMI 43.00 kg/m  , BMI Body mass index is 43 kg/m. GEN: Well nourished, well developed, in no acute distress  HEENT: normal    Neck: no JVD, carotid bruits, or masses Cardiac: iRRR; no murmurs, rubs, or gallops,no edema  Respiratory:  clear to auscultation bilaterally, normal work of breathing GI: soft, nontender, nondistended, + BS MS: no deformity or atrophy  Skin: warm and dry, device site well healed Neuro:  Strength and sensation are intact  Psych: euthymic mood, full affect  EKG:  EKG is not ordered today. Personal review of the ekg ordered 06/03/18 shows SR  Personal review of the device interrogation today. Results in [place]: 05/31/2018: B Natriuretic Peptide 145.9 06/01/2018: ALT 35; Hemoglobin 12.3; Magnesium 2.3; Platelets 239; TSH 1.717 06/24/2018: BUN 18; Creatinine, Ser 1.09; Potassium 3.4; Sodium 138    Lipid Panel     Component Value Date/Time   CHOL 101 06/01/2018 0138   CHOL 116 10/16/2016 0850   TRIG 68 06/01/2018 0138   HDL 48 06/01/2018 0138   HDL 42 10/16/2016 0850   CHOLHDL 2.1 06/01/2018 0138   VLDL 14 06/01/2018 0138   LDLCALC 39 06/01/2018 0138   LDLCALC 46 10/16/2016 0850     Wt Readings from Last 3 Encounters:  08/16/18 262 lb 6.4 oz (119 kg)  08/12/18 262 lb (118.8 kg)  06/11/18 254 lb 9.6 oz (115.5 kg)      Other studies Reviewed: Additional studies/ records that were reviewed today include: TTE 03/28/16  Review of the above records today demonstrates:  - Left ventricle: The cavity size was normal. Wall thickness was   increased in a pattern of mild LVH. Systolic function was normal.   The estimated ejection fraction was in the range of 55% to 60%.   Indeterminant diastolic function (atrial fibrillation). Wall   motion was normal; there were no regional wall motion   abnormalities. - Aortic valve: There was no stenosis. - Mitral valve: Mildly calcified annulus. There was no significant   regurgitation. - Left atrium: The atrium was mildly dilated. - Right ventricle: The cavity size was normal. Pacer wire or   catheter noted in right  ventricle. Systolic function was normal. - Tricuspid valve: Peak RV-RA gradient (S): 22 mm Hg. - Pulmonary arteries: PA peak pressure: 30 mm Hg (S). - Systemic veins: IVC measured 2.3 cm with normal respirophasic   variation, suggesting RA pressure 8 mmHg.  LHC/RHC 05/09/17 1.  Single-vessel coronary artery disease with continued patency of the stented segment in the right coronary artery and chronic occlusion of a small acute marginal branch collateralized by the left coronary artery 2.  Patent left main, LAD, and left circumflex without significant stenosis 3.  Vigorous LV systolic function with mildly elevated LVEDP 4.  Severe pulmonary hypertension with mean pulmonary artery pressure of 48 mmHg.  Because of high cardiac output (8.5 L/min), the patient's pulmonary vascular resistance is only mildly increased at 3 Wood units.  ASSESSMENT AND PLAN:  1.  Persistent atrial fibrillation: Status post AF ablation 09/29/2016 with repeat ablation 02/07/2018.  She is unfortunately had both atrial fibrillation and typical atrial flutter.  She is currently on amiodarone and diltiazem.  He is unfortunately back in atrial fibrillation today after cardioversion.  We will plan for repeat ablation.  This patients CHA2DS2-VASc Score and unadjusted Ischemic Stroke Rate (% per year) is equal to 3.2 % stroke rate/year from a score of 3  Above score calculated as 1 point each if present [CHF, HTN, DM, Vascular=MI/PAD/Aortic Plaque, Age if 65-74, or Female] Above score calculated as 2 points each if present [Age > 75, or Stroke/TIA/TE]   2. Hypertension: Well-controlled  3. Obesity: Weight loss encouraged  4. Chronic diastolic heart failure: No signs of volume overload  5. Obstructive sleep apnea: CPAP compliance encouraged  6.  Sinus node dysfunction: That is post Public house [employer].  She had a high bipolar impedance.  She was thus switched  to unipolar.  Device is functioning appropriately  unipolar without issue.  Current medicines are reviewed at length with the patient today.   The patient does not have concerns regarding her medicines.  The following changes were made today: None  Labs/ tests ordered today include:  No orders of the defined types were placed in this encounter.    Disposition:   FU with Will Camnitz 3 months  Signed, Will Meredith Leeds, MD  08/16/2018 3:32 PM     [address] [place] [address] [phone] (office) [phone] (fax)

## 2018-08-16 NOTE — Patient Instructions (Addendum)
Medication Instructions:  Your physician recommends that you continue on your current medications as directed. Please refer to the Current Medication list given to you today.  * If you need a refill on your cardiac medications before your next appointment, please call your pharmacy.   Labwork: None ordered  Testing/Procedures: None ordered  Follow-Up: To be determined.  Nurse will contact you to arrange a repeat ablation.  Thank you for choosing [facility]!!   Trinidad Curet, RN [phone]  Any Other Special Instructions Will Be Listed Below (If Applicable).

## 2018-08-16 NOTE — Telephone Encounter (Signed)
New Message   Patient is calling to set up for an ablation. She says the first week in April is great for her. Please call to discuss.

## 2018-08-20 ENCOUNTER — Other Ambulatory Visit (INDEPENDENT_AMBULATORY_CARE_PROVIDER_SITE_OTHER): Payer: PPO

## 2018-08-20 ENCOUNTER — Other Ambulatory Visit: Payer: PPO

## 2018-08-20 ENCOUNTER — Ambulatory Visit (HOSPITAL_COMMUNITY)
Admission: RE | Admit: 2018-08-20 | Discharge: 2018-08-20 | Disposition: A | Discharge status: Home / Self Care | Payer: PPO | Source: Ambulatory Visit | Attending: Gastroenterology | Admitting: Gastroenterology

## 2018-08-20 DIAGNOSIS — R748 Abnormal levels of other serum enzymes: Secondary | ICD-10-CM

## 2018-08-20 DIAGNOSIS — K648 Other hemorrhoids: Secondary | ICD-10-CM | POA: Diagnosis not present

## 2018-08-20 DIAGNOSIS — R160 Hepatomegaly, not elsewhere classified: Secondary | ICD-10-CM

## 2018-08-20 DIAGNOSIS — R9389 Abnormal findings on diagnostic imaging of other specified body structures: Secondary | ICD-10-CM | POA: Diagnosis not present

## 2018-08-20 DIAGNOSIS — N261 Atrophy of kidney (terminal): Secondary | ICD-10-CM | POA: Diagnosis not present

## 2018-08-20 LAB — CUP PACEART INCLINIC DEVICE CHECK
Battery Impedance: 1800 Ohm
Battery Voltage: 2.78 V
Date Time Interrogation Session: 20200214204305
Implantable Lead Implant Date: 20081211
Implantable Lead Implant Date: 20081211
Implantable Lead Location: 753859
Implantable Lead Location: 753860
Implantable Pulse Generator Implant Date: 20081211
Lead Channel Impedance Value: 310 Ohm
Lead Channel Impedance Value: 429 Ohm
Lead Channel Pacing Threshold Amplitude: 1.25 V
Lead Channel Pacing Threshold Pulse Width: 1 ms
Lead Channel Setting Pacing Amplitude: 2 V
Lead Channel Setting Pacing Amplitude: 2.5 V
Lead Channel Setting Pacing Pulse Width: 1 ms
Lead Channel Setting Sensing Sensitivity: 2.5 mV
Pulse Gen Model: 5826
Pulse Gen Serial Number: 1999089

## 2018-08-20 LAB — GLUCOSE, RANDOM: Glucose, Bld: 119 mg/dL — ABNORMAL HIGH (ref 70–99)

## 2018-08-21 DIAGNOSIS — M47816 Spondylosis without myelopathy or radiculopathy, lumbar region: Secondary | ICD-10-CM | POA: Diagnosis not present

## 2018-08-23 NOTE — Telephone Encounter (Signed)
Repeat AFib ablation scheduled for 4/1. Scheduled for H&P and pre procedure labs on 3/20. Aware office will contact her to arrange cardiac CT. CT and procedure instructions sent via MyChart. Patient verbalized understanding and agreeable to plan.

## 2018-08-27 LAB — INSULIN, FREE (BIOACTIVE): Insulin, Free: 12.1 u[IU]/mL (ref 1.5–14.9)

## 2018-09-03 NOTE — Progress Notes (Signed)
Chief Complaint  Patient presents with  . Follow-up    Pt states that she has injections in spine since last visit and is possibly getting another abalsion. pt states that she is gaining weight and can't stop     HPI: [name] 73 y.o. come in for Chronic disease management   4 mos fu   BG probabaly usp.    Associated eating with mom 89 at nursing facility .  CV to have ablation in April  Has been using about weekly lotrisonin inguinal are for redness irritation    Can she have a refill ?  Has seen gi about the alk phos elevation ROS: See pertinent positives and negatives per HPI. No fever falling  Injection in back see above   Past Medical History:  Diagnosis Date  . Anticoagulant long-term use    pradaxa  . Anxiety   . Arthritis    "fingers, lower back" (04/23/2017   . CAD (coronary artery disease) 0370,4888   post PTCA with bare-metal stenting to mid RCA in December 2004     . CHF (congestive heart failure) ([place])   . Chronic atrial fibrillation 06/2007   Tachybradycardia pacemaker  . Chronic kidney disease    10% function - ?R, other kidney is compensating    . CVA (cerebral vascular accident) [facility]) [phone]   denies residual on 04/23/2017  . Depression   . Diplopia 06/19/2008   Qualifier: Diagnosis of  By: [name] MD, [name]   . Dysrhythmia    ATRIAL FIBRILATION  . Edema of lower extremity   . Hyperlipidemia   . Hypertension   . Inferior myocardial infarction [facility])    acute inferior wall mi/other medical hx  . Myocardial infarction ([place]) [id number]  . Obesity   . OSA on CPAP    last test- 2010  . Pacemaker   . Pneumonia 2014   tx. ----  [facility]  . Pulmonary hypertension ([place])    moderate pulmonary hypertension by 10/2016 echo and 10/2013 cardiac cath  . Shortness of breath   . Skin cancer    "cut off right Register; burned off LLE" (04/23/2017)  . Sleep apnea   . Spondylolisthesis   . TIA (transient ischemic attack) 2008  . Unspecified  hemorrhoids without mention of complication 8/88/2800   Colonoscopy--Dr. Carlean Purl     Family History  Problem Relation Age of Onset  . Suicidality Father        suicide death pt was 3 yrs, 84  . Arrhythmia Mother   . Hypertension Mother   . Diabetes Mother   . Dementia Mother   . Heart attack Brother   . Heart disease Paternal Aunt   . Prostate cancer Maternal Grandfather   . Diabetes Paternal Grandfather        fathers side of the family  . Colon cancer Maternal Aunt     Social History   Socioeconomic History  . Marital status: Married    Spouse name: Not on file  . Number of children: 0  . Years of education: HS  . Highest education level: Not on file  Occupational History  . Occupation: retired    Comment: previously worked [employer]  . Financial resource strain: Not hard at all  . Food insecurity:    Worry: Never true    Inability: Never true  . Transportation needs:    Medical: No    Non-medical: No  Tobacco Use  . Smoking status:  Former Smoker    Packs/day: 1.00    Years: 5.00    Pack years: 5.00    Types: Cigarettes    Start date: 05/11/1978    Last attempt to quit: 07/03/1982    Years since quitting: 36.1  . Smokeless tobacco: Never Used  Substance and Sexual Activity  . Alcohol use: Yes    Comment: 04/23/2017 "once q 6 months; glass of wine"  . Drug use: No  . Sexual activity: Not Currently  Lifestyle  . Physical activity:    Days per week: 0 days    Minutes per session: 0 min  . Stress: Rather much  Relationships  . Social connections:    Talks on phone: More than three times a week    Gets together: More than three times a week    Attends religious service: More than 4 times per year    Active member of club or organization: Yes    Attends meetings of clubs or organizations: More than 4 times per year    Relationship status: Married  Other Topics Concern  . 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   Married    Originally from [place] of two, high school education   Former smoker [phone]   Hunting dogs 7    Retired from [employer] 2     Outpatient Medications Prior to Visit  Medication Sig Dispense Refill  . acetaminophen (TYLENOL) 500 MG tablet Take 1,000 mg by mouth 2 (two) times daily.     [name] albuterol (PROVENTIL HFA;VENTOLIN HFA) 108 (90 Base) MCG/ACT inhaler INHALE 2 PUFFS INTO THE LUNGS EVERY 4 (FOUR) HOURS AS NEEDED FOR WHEEZING OR SHORTNESS OF BREATH. 6.7 Inhaler 0  . Ascorbic Acid (VITAMIN C) 1000 MG tablet Take 1,000 mg by mouth daily.    [name] atorvastatin (LIPITOR) 40 MG tablet TAKE 1 TABLET BY MOUTH EVERY DAY (Patient taking differently: Take 40 mg by mouth daily. ) 90 tablet 3  . Calcium Carbonate-Vitamin D (CALCIUM 600+D PO) Take 1 tablet by mouth daily.    [name] diltiazem (CARDIZEM CD) 240 MG 24 hr capsule Take 1 capsule (240 mg total) by mouth daily. 90 capsule 2  . diphenhydramine-acetaminophen (TYLENOL PM) 25-500 MG TABS tablet Take 2 tablets by mouth at bedtime.     [name] ELIQUIS 5 MG TABS tablet TAKE 1 TABLET BY MOUTH TWICE A DAY (Patient taking differently: Take 5 mg by mouth 2 (two) times daily. ) 180 tablet 1  . FLUoxetine (PROZAC) 20 MG capsule Take 1 capsule (20 mg total) by mouth daily. 90 capsule 0  . fluticasone (FLONASE) 50 MCG/ACT nasal spray Place 1 spray into both nostrils at bedtime as needed for allergies.     [name] KLOR-CON M20 20 MEQ tablet TAKE 1 TABLET BY MOUTH TWICE A DAY (Patient taking differently: Take 20 mEq by mouth 2 (two) times daily. ) 180 tablet 3  . loratadine (CLARITIN) 10 MG tablet TAKE 1 TABLET BY MOUTH EVERY DAY (Patient taking differently: Take 10 mg by mouth daily. ) 90 tablet 1  . Lysine 1000 MG TABS Take 1,000 mg by mouth at bedtime.     . Multiple Vitamin (MULTIVITAMIN WITH MINERALS) TABS tablet Take 1 tablet by mouth daily. Centrum    . nitroGLYCERIN (NITROSTAT)  0.4 MG SL tablet Place 1 tablet (0.4 mg total) under the tongue every  5 (five) minutes x 3 doses as needed for chest pain. 25 tablet 6  . Polyethyl Glycol-Propyl Glycol (SYSTANE) 0.4-0.3 % SOLN Place 1 drop into both eyes at bedtime as needed (for dry eyes).     . valACYclovir (VALTREX) 1000 MG tablet Take 1 tablet (1,000 mg total) by mouth every 12 (twelve) hours as needed (cold sores).    . vitamin B-12 (CYANOCOBALAMIN) 500 MCG tablet Take 500 mcg by mouth at bedtime.    . vitamin E 400 UNIT capsule Take 800 Units by mouth at bedtime.     . torsemide (DEMADEX) 20 MG tablet Take 2 tablets (40 mg total) by mouth 2 (two) times daily. 180 tablet 3   No facility-administered medications prior to visit.      EXAM:  BP 126/74 (BP Location: Right Arm, Patient Position: Sitting, Cuff Size: Large)   Pulse 87   Temp 98.4 F (36.9 C) (Oral)   Ht '5\' 6"'  (1.676 m)   Wt 264 lb 6.4 oz (119.9 kg)   BMI 42.68 kg/m   Body mass index is 42.68 kg/m.  GENERAL: vitals reviewed and listed above, alert, oriented, appears well hydrated and in no acute distress HEENT: atraumatic, conjunctiva  clear, no obvious abnormalities on inspection of external nose and ears  NECK: no obvious masses on inspection palpation  LUNGS: clear to auscultation bilaterally, no wheezes, rales or rhonchi, good air movement CV: HRRR, no clubbing cyanosis 1_   peripheral edema nl cap refill  Inguinal are faint erythema no whee[ing or  Satellite rash  MS: moves all extremities without noticeable focal  abnormality PSYCH: pleasant and cooperative, no obvious depression or anxiety Lab Results  Component Value Date   WBC 7.3 06/01/2018   HGB 12.3 06/01/2018   HCT 38.1 06/01/2018   PLT 239 06/01/2018   GLUCOSE 119 (H) 08/20/2018   CHOL 101 06/01/2018   TRIG 68 06/01/2018   HDL 48 06/01/2018   LDLCALC 39 06/01/2018   ALT 35 06/01/2018   AST 26 06/01/2018   NA 138 06/24/2018   K 3.4 (L) 06/24/2018   CL 97 06/24/2018    CREATININE 1.09 06/24/2018   BUN 18 06/24/2018   CO2 29 06/24/2018   TSH 1.717 06/01/2018   INR 1.0 05/08/2017   HGBA1C 6.1 (A) 09/04/2018   MICROALBUR <0.7 01/23/2017   BP Readings from Last 3 Encounters:  09/04/18 126/74  08/16/18 124/68  08/12/18 130/78   Wt Readings from Last 3 Encounters:  09/04/18 264 lb 6.4 oz (119.9 kg)  08/16/18 262 lb 6.4 oz (119 kg)  08/12/18 262 lb (118.8 kg)    ASSESSMENT AND PLAN:  Discussed the following assessment and plan:  Pre-diabetes - Plan: POCT glycosylated hemoglobin (Hb A1C)  Medication management  Intertrigo intermittent - had been using lotrisone cuation with high potency in inguinal area try mycologue ( still somewhat higher potency trial )   Stress due to illness of family member - coping pretty well     A 1c ix stable   interventionist   Caution w/ steroid creams in intertriginous area    Will try mycologu   for ease   Of use but    Due for a fib ablation  April ...  Total visit 82mns > 50% spent counseling and coordinating care as indicated in above note and in instructions to patient .  -Patient advised to return or notify health care team  if  new concerns arise. In the interim  Patient Instructions  a1c sugar is   Acceptable    Attention to eating healthier as you are planning   And    FU in   4-6 months to keep an eye on the sugar.    Preventing Type 2 Diabetes Mellitus Type 2 diabetes (type 2 diabetes mellitus) is a long-term (chronic) disease that affects blood sugar (glucose) levels. Normally, a hormone called insulin allows glucose to enter cells in the body. The cells use glucose for energy. In type 2 diabetes, one or both of these problems may be present:  The body does not make enough insulin.  The body does not respond properly to insulin that it makes (insulin resistance). Insulin resistance or lack of insulin causes excess glucose to build up in the blood instead of going into cells. As a result, high  blood glucose (hyperglycemia) develops, which can cause many complications. Being overweight or obese and having an inactive (sedentary) lifestyle can increase your risk for diabetes. Type 2 diabetes can be delayed or prevented by making certain nutrition and lifestyle changes. What nutrition changes can be made?   Eat healthy meals and snacks regularly. Keep a healthy snack with you for when you get hungry between meals, such as fruit or a handful of nuts.  Eat lean meats and proteins that are low in saturated fats, such as chicken, fish, egg whites, and beans. Avoid processed meats.  Eat plenty of fruits and vegetables and plenty of grains that have not been processed (whole grains). It is recommended that you eat: ? 1?2 cups of fruit every day. ? 2?3 cups of vegetables every day. ? 6?8 oz of whole grains every day, such as oats, whole wheat, bulgur, brown rice, quinoa, and millet.  Eat low-fat dairy products, such as milk, yogurt, and cheese.  Eat foods that contain healthy fats, such as nuts, avocado, olive oil, and canola oil.  Drink water throughout the day. Avoid drinks that contain added sugar, such as soda or sweet tea.  Follow instructions from your health care provider about specific eating or drinking restrictions.  Control how much food you eat at a time (portion size). ? Check food labels to find out the serving sizes of foods. ? Use a kitchen scale to weigh amounts of foods.  Saute or steam food instead of frying it. Cook with water or broth instead of oils or butter.  Limit your intake of: ? Salt (sodium). Have no more than 1 tsp (2,400 mg) of sodium a day. If you have heart disease or high blood pressure, have less than ? tsp (1,500 mg) of sodium a day. ? Saturated fat. This is fat that is solid at room temperature, such as butter or fat on meat. What lifestyle changes can be made? Activity   Do moderate-intensity physical activity for at least 30 minutes on at  least 5 days of the week, or as much as told by your health care provider.  Ask your health care provider what activities are safe for you. A mix of physical activities may be best, such as walking, swimming, cycling, and strength training.  Try to add physical activity into your day. For example: ? Park in spots that are farther away than usual, so that you walk more. For example, park in a far corner of the parking lot when you go to the office or the grocery store. ? Take a walk during your lunch break. ? Use stairs instead of elevators or escalators. Weight Loss  Lose weight  as directed. Your health care provider can determine how much weight loss is best for you and can help you lose weight safely.  If you are overweight or obese, you may be instructed to lose at least 5?7 % of your body weight. Alcohol and Tobacco   Limit alcohol intake to no more than 1 drink a day for nonpregnant women and 2 drinks a day for men. One drink equals 12 oz of beer, 5 oz of wine, or 1 oz of hard liquor.  Do not use any tobacco products, such as cigarettes, chewing tobacco, and e-cigarettes. If you need help quitting, ask your health care provider. Work With [place] Provider  Have your blood glucose tested regularly, as told by your health care provider.  Discuss your risk factors and how you can reduce your risk for diabetes.  Get screening tests as told by your health care provider. You may have screening tests regularly, especially if you have certain risk factors for type 2 diabetes.  Make an appointment with a diet and nutrition specialist (registered dietitian). A registered dietitian can help you make a healthy eating plan and can help you understand portion sizes and food labels. Why are these changes important?  It is possible to prevent or delay type 2 diabetes and related health problems by making lifestyle and nutrition changes.  It can be difficult to recognize signs of type 2  diabetes. The best way to avoid possible damage to your body is to take actions to prevent the disease before you develop symptoms. What can happen if changes are not made?  Your blood glucose levels may keep increasing. Having high blood glucose for a long time is dangerous. Too much glucose in your blood can damage your blood vessels, heart, kidneys, nerves, and eyes.  You may develop prediabetes or type 2 diabetes. Type 2 diabetes can lead to many chronic health problems and complications, such as: ? Heart disease. ? Stroke. ? Blindness. ? Kidney disease. ? Depression. ? Poor circulation in the feet and legs, which could lead to surgical removal (amputation) in severe cases. Where to find support  Ask your health care provider to recommend a registered dietitian, diabetes educator, or weight loss program.  Look for local or online weight loss groups.  Join a gym, fitness club, or outdoor activity group, such as a walking club. Where to find more information To learn more about diabetes and diabetes prevention, visit:  American Diabetes Association (ADA): www.diabetes.[employer] of Diabetes and Digestive and Kidney Diseases: FindSpin.nl To learn more about healthy eating, visit:  The U.S. Department of Agriculture Scientist, research (physical sciences)), Choose My Plate: http://wiley-williams.com/  Office of Disease Prevention and Health Promotion (ODPHP), Dietary Guidelines: SurferLive.at Summary  You can reduce your risk for type 2 diabetes by increasing your physical activity, eating healthy foods, and losing weight as directed.  Talk with your health care provider about your risk for type 2 diabetes. Ask about any blood tests or screening tests that you need to have. This information is not intended to replace advice given to you by your health care provider. Make sure you discuss any questions you have with your health care  provider. Document Released: 10/11/2015 Document Revised: 05/31/2017 Document Reviewed: 08/10/2015 Elsevier Interactive Patient Education  2019 [place] K. Estellar Cadena M.D.

## 2018-09-04 ENCOUNTER — Encounter: Payer: Self-pay | Admitting: Internal Medicine

## 2018-09-04 ENCOUNTER — Ambulatory Visit (INDEPENDENT_AMBULATORY_CARE_PROVIDER_SITE_OTHER): Payer: PPO | Admitting: Internal Medicine

## 2018-09-04 VITALS — BP 126/74 | HR 87 | Temp 98.4°F | Ht 66.0 in | Wt 264.4 lb

## 2018-09-04 DIAGNOSIS — R7303 Prediabetes: Secondary | ICD-10-CM

## 2018-09-04 DIAGNOSIS — Z6379 Other stressful life events affecting family and household: Secondary | ICD-10-CM | POA: Diagnosis not present

## 2018-09-04 DIAGNOSIS — Z79899 Other long term (current) drug therapy: Secondary | ICD-10-CM

## 2018-09-04 DIAGNOSIS — L304 Erythema intertrigo: Secondary | ICD-10-CM

## 2018-09-04 LAB — POCT GLYCOSYLATED HEMOGLOBIN (HGB A1C): Hemoglobin A1C: 6.1 % — AB (ref 4.0–5.6)

## 2018-09-04 MED ORDER — NYSTATIN-TRIAMCINOLONE 100000-0.1 UNIT/GM-% EX CREA: 1.0000 "application " | TOPICAL_CREAM | Freq: Two times a day (BID) | CUTANEOUS | 1 refills | Status: DC | PRN

## 2018-09-04 NOTE — Patient Instructions (Addendum)
a1c sugar is   Acceptable    Attention to eating healthier as you are planning   And    FU in   4-6 months to keep an eye on the sugar.    Preventing Type 2 Diabetes Mellitus Type 2 diabetes (type 2 diabetes mellitus) is a long-term (chronic) disease that affects blood sugar (glucose) levels. Normally, a hormone called insulin allows glucose to enter cells in the body. The cells use glucose for energy. In type 2 diabetes, one or both of these problems may be present:  The body does not make enough insulin.  The body does not respond properly to insulin that it makes (insulin resistance). Insulin resistance or lack of insulin causes excess glucose to build up in the blood instead of going into cells. As a result, high blood glucose (hyperglycemia) develops, which can cause many complications. Being overweight or obese and having an inactive (sedentary) lifestyle can increase your risk for diabetes. Type 2 diabetes can be delayed or prevented by making certain nutrition and lifestyle changes. What nutrition changes can be made?   Eat healthy meals and snacks regularly. Keep a healthy snack with you for when you get hungry between meals, such as fruit or a handful of nuts.  Eat lean meats and proteins that are low in saturated fats, such as chicken, fish, egg whites, and beans. Avoid processed meats.  Eat plenty of fruits and vegetables and plenty of grains that have not been processed (whole grains). It is recommended that you eat: ? 1?2 cups of fruit every day. ? 2?3 cups of vegetables every day. ? 6?8 oz of whole grains every day, such as oats, whole wheat, bulgur, brown rice, quinoa, and millet.  Eat low-fat dairy products, such as milk, yogurt, and cheese.  Eat foods that contain healthy fats, such as nuts, avocado, olive oil, and canola oil.  Drink water throughout the day. Avoid drinks that contain added sugar, such as soda or sweet tea.  Follow instructions from your health care  provider about specific eating or drinking restrictions.  Control how much food you eat at a time (portion size). ? Check food labels to find out the serving sizes of foods. ? Use a kitchen scale to weigh amounts of foods.  Saute or steam food instead of frying it. Cook with water or broth instead of oils or butter.  Limit your intake of: ? Salt (sodium). Have no more than 1 tsp (2,400 mg) of sodium a day. If you have heart disease or high blood pressure, have less than ? tsp (1,500 mg) of sodium a day. ? Saturated fat. This is fat that is solid at room temperature, such as butter or fat on meat. What lifestyle changes can be made? Activity   Do moderate-intensity physical activity for at least 30 minutes on at least 5 days of the week, or as much as told by your health care provider.  Ask your health care provider what activities are safe for you. A mix of physical activities may be best, such as walking, swimming, cycling, and strength training.  Try to add physical activity into your day. For example: ? Park in spots that are farther away than usual, so that you walk more. For example, park in a far corner of the parking lot when you go to the office or the grocery store. ? Take a walk during your lunch break. ? Use stairs instead of elevators or escalators. Weight Loss  Lose weight as  directed. Your health care provider can determine how much weight loss is best for you and can help you lose weight safely.  If you are overweight or obese, you may be instructed to lose at least 5?7 % of your body weight. Alcohol and Tobacco   Limit alcohol intake to no more than 1 drink a day for nonpregnant women and 2 drinks a day for men. One drink equals 12 oz of beer, 5 oz of wine, or 1 oz of hard liquor.  Do not use any tobacco products, such as cigarettes, chewing tobacco, and e-cigarettes. If you need help quitting, ask your health care provider. Work With [place]  Provider  Have your blood glucose tested regularly, as told by your health care provider.  Discuss your risk factors and how you can reduce your risk for diabetes.  Get screening tests as told by your health care provider. You may have screening tests regularly, especially if you have certain risk factors for type 2 diabetes.  Make an appointment with a diet and nutrition specialist (registered dietitian). A registered dietitian can help you make a healthy eating plan and can help you understand portion sizes and food labels. Why are these changes important?  It is possible to prevent or delay type 2 diabetes and related health problems by making lifestyle and nutrition changes.  It can be difficult to recognize signs of type 2 diabetes. The best way to avoid possible damage to your body is to take actions to prevent the disease before you develop symptoms. What can happen if changes are not made?  Your blood glucose levels may keep increasing. Having high blood glucose for a long time is dangerous. Too much glucose in your blood can damage your blood vessels, heart, kidneys, nerves, and eyes.  You may develop prediabetes or type 2 diabetes. Type 2 diabetes can lead to many chronic health problems and complications, such as: ? Heart disease. ? Stroke. ? Blindness. ? Kidney disease. ? Depression. ? Poor circulation in the feet and legs, which could lead to surgical removal (amputation) in severe cases. Where to find support  Ask your health care provider to recommend a registered dietitian, diabetes educator, or weight loss program.  Look for local or online weight loss groups.  Join a gym, fitness club, or outdoor activity group, such as a walking club. Where to find more information To learn more about diabetes and diabetes prevention, visit:  American Diabetes Association (ADA): www.diabetes.[employer] of Diabetes and Digestive and Kidney Diseases:  FindSpin.nl To learn more about healthy eating, visit:  The U.S. Department of Agriculture Scientist, research (physical sciences)), Choose My Plate: http://wiley-williams.com/  Office of Disease Prevention and Health Promotion (ODPHP), Dietary Guidelines: SurferLive.at Summary  You can reduce your risk for type 2 diabetes by increasing your physical activity, eating healthy foods, and losing weight as directed.  Talk with your health care provider about your risk for type 2 diabetes. Ask about any blood tests or screening tests that you need to have. This information is not intended to replace advice given to you by your health care provider. Make sure you discuss any questions you have with your health care provider. Document Released: 10/11/2015 Document Revised: 05/31/2017 Document Reviewed: 08/10/2015 Elsevier Interactive Patient Education  2019 [employer].

## 2018-09-11 DIAGNOSIS — M47816 Spondylosis without myelopathy or radiculopathy, lumbar region: Secondary | ICD-10-CM | POA: Diagnosis not present

## 2018-09-11 DIAGNOSIS — I10 Essential (primary) hypertension: Secondary | ICD-10-CM | POA: Diagnosis not present

## 2018-09-11 DIAGNOSIS — Z6841 Body Mass Index (BMI) 40.0 and over, adult: Secondary | ICD-10-CM | POA: Diagnosis not present

## 2018-09-18 ENCOUNTER — Telehealth: Payer: Self-pay | Admitting: *Deleted

## 2018-09-18 NOTE — Telephone Encounter (Signed)
Advised pt on all non-emergent cases being cancelled for the next month d/t COVID-19.  Pt aware I will call her once able to start rescheduling things. Advised to call office if she has any issues r/t her AFib. Patient verbalized understanding and agreeable to plan.

## 2018-09-19 ENCOUNTER — Other Ambulatory Visit: Payer: Self-pay | Admitting: Internal Medicine

## 2018-09-20 ENCOUNTER — Ambulatory Visit: Payer: PPO | Admitting: Cardiology

## 2018-09-25 ENCOUNTER — Ambulatory Visit (HOSPITAL_COMMUNITY): Payer: PPO

## 2018-09-30 ENCOUNTER — Ambulatory Visit (HOSPITAL_COMMUNITY): Payer: PPO

## 2018-09-30 ENCOUNTER — Other Ambulatory Visit: Payer: Self-pay | Admitting: *Deleted

## 2018-09-30 DIAGNOSIS — D225 Melanocytic nevi of trunk: Secondary | ICD-10-CM | POA: Diagnosis not present

## 2018-09-30 DIAGNOSIS — L821 Other seborrheic keratosis: Secondary | ICD-10-CM | POA: Diagnosis not present

## 2018-09-30 DIAGNOSIS — L820 Inflamed seborrheic keratosis: Secondary | ICD-10-CM | POA: Diagnosis not present

## 2018-09-30 DIAGNOSIS — L812 Freckles: Secondary | ICD-10-CM | POA: Diagnosis not present

## 2018-09-30 DIAGNOSIS — Z85828 Personal history of other malignant neoplasm of skin: Secondary | ICD-10-CM | POA: Diagnosis not present

## 2018-09-30 DIAGNOSIS — D2261 Melanocytic nevi of right upper limb, including shoulder: Secondary | ICD-10-CM | POA: Diagnosis not present

## 2018-09-30 DIAGNOSIS — D1801 Hemangioma of skin and subcutaneous tissue: Secondary | ICD-10-CM | POA: Diagnosis not present

## 2018-09-30 DIAGNOSIS — L570 Actinic keratosis: Secondary | ICD-10-CM | POA: Diagnosis not present

## 2018-09-30 MED ORDER — APIXABAN 5 MG PO TABS: 5.0000 mg | ORAL_TABLET | Freq: Two times a day (BID) | ORAL | 2 refills | Status: DC

## 2018-09-30 NOTE — Telephone Encounter (Signed)
Pt is a 73 yr old female who saw Dr. Curt Bears on 08/16/18. Last noted weight on 09/04/18 was 119.9Kg. SCr on 06/24/18 was 1.09. Will refill Eliquis 5mg  BID.

## 2018-10-01 ENCOUNTER — Other Ambulatory Visit: Payer: Self-pay | Admitting: Cardiovascular Disease

## 2018-10-02 ENCOUNTER — Encounter (HOSPITAL_COMMUNITY): Payer: Self-pay

## 2018-10-02 ENCOUNTER — Ambulatory Visit (HOSPITAL_COMMUNITY): Admit: 2018-10-02 | Payer: PPO | Admitting: Cardiology

## 2018-10-02 SURGERY — ATRIAL FIBRILLATION ABLATION
Anesthesia: General

## 2018-10-21 ENCOUNTER — Other Ambulatory Visit (HOSPITAL_COMMUNITY): Payer: Self-pay | Admitting: Nurse Practitioner

## 2018-10-22 ENCOUNTER — Other Ambulatory Visit: Payer: Self-pay | Admitting: Internal Medicine

## 2018-11-05 DIAGNOSIS — M47816 Spondylosis without myelopathy or radiculopathy, lumbar region: Secondary | ICD-10-CM | POA: Diagnosis not present

## 2018-12-03 DIAGNOSIS — M47816 Spondylosis without myelopathy or radiculopathy, lumbar region: Secondary | ICD-10-CM | POA: Diagnosis not present

## 2018-12-20 ENCOUNTER — Other Ambulatory Visit: Payer: Self-pay | Admitting: Internal Medicine

## 2019-01-06 ENCOUNTER — Ambulatory Visit (INDEPENDENT_AMBULATORY_CARE_PROVIDER_SITE_OTHER): Payer: PPO | Admitting: Internal Medicine

## 2019-01-06 ENCOUNTER — Other Ambulatory Visit: Payer: Self-pay

## 2019-01-06 ENCOUNTER — Encounter: Payer: Self-pay | Admitting: Internal Medicine

## 2019-01-06 DIAGNOSIS — Z7901 Long term (current) use of anticoagulants: Secondary | ICD-10-CM

## 2019-01-06 DIAGNOSIS — I10 Essential (primary) hypertension: Secondary | ICD-10-CM | POA: Diagnosis not present

## 2019-01-06 DIAGNOSIS — Z6838 Body mass index (BMI) 38.0-38.9, adult: Secondary | ICD-10-CM | POA: Diagnosis not present

## 2019-01-06 DIAGNOSIS — I480 Paroxysmal atrial fibrillation: Secondary | ICD-10-CM

## 2019-01-06 DIAGNOSIS — L304 Erythema intertrigo: Secondary | ICD-10-CM

## 2019-01-06 DIAGNOSIS — R7303 Prediabetes: Secondary | ICD-10-CM

## 2019-01-06 DIAGNOSIS — Z79899 Other long term (current) drug therapy: Secondary | ICD-10-CM | POA: Diagnosis not present

## 2019-01-06 DIAGNOSIS — R7301 Impaired fasting glucose: Secondary | ICD-10-CM

## 2019-01-06 NOTE — Progress Notes (Signed)
Virtual Visit via Video Note  I connected with@ on 01/06/19 at  3:15 PM EDT by a video enabled telemedicine application and verified that I am speaking with the correct person using two identifiers. Location patient: home Location provider:work office Persons participating in the virtual visit: patient, provider  [place] national recommendations  regarding COVID 19 pandemic   video visit is advised over in office visit for this patient.  Patient aware  of the limitations of evaluation and management by telemedicine and  availability of in person appointments. and agreed to proceed.   HPI: [name] presents for video visit for Chronic disease management    Not eating as well since covid and mom in hops x 4 and stress but doing better with fruits  Weight about 165  "Still in A fib"  CV was cx and delayed  cause f covid was supposed to be April  S. Thinks she would be better if ablation successful.,  Staying  Safe  Grocery shopping   etc  Had eval dr beavers for abn lfts not totally supportive or AIliver issues   Poss fatty lvier    3/4 20 visit   Under care rehab [facility] for spondylosis   Intertrigo is under control   No neuropathy new eye sx   Spinal disease is problematic   ROS: See pertinent positives and negatives per HPI. No bleeding  Past Medical History:  Diagnosis Date  . Anticoagulant long-term use    pradaxa  . Anxiety   . Arthritis    "fingers, lower back" (04/23/2017   . CAD (coronary artery disease) 0240,9735   post PTCA with bare-metal stenting to mid RCA in December 2004     . CHF (congestive heart failure) ([place])   . Chronic atrial fibrillation 06/2007   Tachybradycardia pacemaker  . Chronic kidney disease    10% function - ?R, other kidney is compensating    . CVA (cerebral vascular accident) [facility]) [phone]   denies residual on 04/23/2017  . Depression   . Diplopia 06/19/2008   Qualifier: Diagnosis of  By: [name] MD, [name]   . Dysrhythmia    ATRIAL FIBRILATION  . Edema of lower extremity   . Hyperlipidemia   . Hypertension   . Inferior myocardial infarction [facility])    acute inferior wall mi/other medical hx  . Myocardial infarction ([place]) [id number]  . Obesity   . OSA on CPAP    last test- 2010  . Pacemaker   . Pneumonia 2014   tx. ----  [facility]  . Pulmonary hypertension ([place])    moderate pulmonary hypertension by 10/2016 echo and 10/2013 cardiac cath  . Shortness of breath   . Skin cancer    "cut off right Deavers; burned off LLE" (04/23/2017)  . Sleep apnea   . Spondylolisthesis   . TIA (transient ischemic attack) 2008  . Unspecified hemorrhoids without mention of complication 8/34/1962   Colonoscopy--Dr. Carlean Purl     Past Surgical History:  Procedure Laterality Date  . ABDOMINAL HYSTERECTOMY    . APPENDECTOMY  1984  . ATRIAL FIBRILLATION ABLATION N/A 09/29/2016   Procedure: Atrial Fibrillation Ablation;  Surgeon: Will Meredith Leeds, MD;  Location: [place] CV LAB;  Service: Cardiovascular;  Laterality: N/A;  . ATRIAL FIBRILLATION ABLATION N/A 02/07/2018   Procedure: ATRIAL FIBRILLATION ABLATION;  Surgeon: Constance Haw, MD;  Location: [place] CV LAB;  Service: Cardiovascular;  Laterality: N/A;  . BACK SURGERY    . CARDIOVERSION N/A 09/12/2017  Procedure: CARDIOVERSION;  Surgeon: Jerline Pain, MD;  Location[facility] ENDOSCOPY;  Service: Cardiovascular;  Laterality: N/A;  . CARDIOVERSION N/A 12/13/2017   Procedure: CARDIOVERSION;  Surgeon: Sanda Klein, MD;  Location: [place] ENDOSCOPY;  Service: Cardiovascular;  Laterality: N/A;  . CARDIOVERSION N/A 02/18/2018   Procedure: CARDIOVERSION;  Surgeon: Dorothy Spark, MD;  Location: [facility] ENDOSCOPY;  Service: Cardiovascular;  Laterality: N/A;  . CARDIOVERSION N/A 05/20/2018   Procedure: CARDIOVERSION;  Surgeon: [name], MD;  Location: [facility] ENDOSCOPY;  Service: Cardiovascular;  Laterality: N/A;  . CATARACT EXTRACTION W/ INTRAOCULAR LENS  IMPLANT,  BILATERAL Bilateral 01/15/2017- 03/2017  . CHOLECYSTECTOMY    . CORONARY ANGIOPLASTY  X 2  . CORONARY ANGIOPLASTY WITH STENT PLACEMENT  1998; ~ 2007; ?date   "1 stent; replaced stent; not sure when I got the last stent" (04/23/2017)  . DOPPLER ECHOCARDIOGRAPHY  2009  . ELECTROPHYSIOLOGIC STUDY N/A 03/31/2016   Procedure: Cardioversion;  Surgeon: Evans Lance, MD;  Location: [place] CV LAB;  Service: Cardiovascular;  Laterality: N/A;  . ELECTROPHYSIOLOGIC STUDY N/A 08/04/2016   Procedure: Cardioversion;  Surgeon: Evans Lance, MD;  Location: [place] CV LAB;  Service: Cardiovascular;  Laterality: N/A;  . INSERT / REPLACE / REMOVE PACEMAKER  06/2007  . IR RADIOLOGY PERIPHERAL GUIDED IV START  01/31/2018  . IR US GUIDE VASC ACCESS LEFT  01/31/2018  . JOINT REPLACEMENT    . LAPAROSCOPIC CHOLECYSTECTOMY  1994  . LEFT AND RIGHT HEART CATHETERIZATION WITH CORONARY ANGIOGRAM N/A 10/06/2013   Procedure: LEFT AND RIGHT HEART CATHETERIZATION WITH CORONARY ANGIOGRAM;  Surgeon: Troy Sine, MD;  Location: [facility] CATH LAB;  Service: Cardiovascular;  Laterality: N/A;  . LEFT OOPHORECTOMY Left ~ 1989  . POSTERIOR LUMBAR FUSION  2000s - 04/2015 X 3   L3-4; L4-5; L2-3; Dr Trenton Gammon  . RIGHT/LEFT HEART CATH AND CORONARY ANGIOGRAPHY N/A 05/09/2017   Procedure: RIGHT/LEFT HEART CATH AND CORONARY ANGIOGRAPHY;  Surgeon: Sherren Mocha, MD;  Location: [place] CV LAB;  Service: Cardiovascular;  Laterality: N/A;  . SKIN CANCER EXCISION Right    [place]  . TEE WITHOUT CARDIOVERSION N/A 09/29/2016   Procedure: TRANSESOPHAGEAL ECHOCARDIOGRAM (TEE);  Surgeon: Jerline Pain, MD;  Location: [place];  Service: Cardiovascular;  Laterality: N/A;  . TOTAL ABDOMINAL HYSTERECTOMY  1984   "uterus & right ovary"  . TOTAL KNEE ARTHROPLASTY Left 04/23/2017  . TOTAL KNEE ARTHROPLASTY Left 04/23/2017   Procedure: TOTAL KNEE ARTHROPLASTY;  Surgeon: Frederik Pear, MD;  Location: [place];  Service: Orthopedics;  Laterality: Left;  .  ULTRASOUND GUIDANCE FOR VASCULAR ACCESS  05/09/2017   Procedure: Ultrasound Guidance For Vascular Access;  Surgeon: Sherren Mocha, MD;  Location: [place] CV LAB;  Service: Cardiovascular;;    Family History  Problem Relation Age of Onset  . Suicidality Father        suicide death pt was 3 yrs, 68  . Arrhythmia Mother   . Hypertension Mother   . Diabetes Mother   . Dementia Mother   . Heart attack Brother   . Heart disease Paternal Aunt   . Prostate cancer Maternal Grandfather   . Diabetes Paternal Grandfather        fathers side of the family  . Colon cancer Maternal Aunt     Social History   Tobacco Use  . Smoking status: Former Smoker    Packs/day: 1.00    Years: 5.00    Pack years: 5.00    Types: Cigarettes  Start date: 05/11/1978    Quit date: 07/03/1982    Years since quitting: 36.5  . Smokeless tobacco: Never Used  Substance Use Topics  . Alcohol use: Yes    Comment: 04/23/2017 "once q 6 months; glass of wine"  . Drug use: No      Current Outpatient Medications:  .  acetaminophen (TYLENOL) 500 MG tablet, Take 1,000 mg by mouth 2 (two) times daily. , Disp: , Rfl:  .  albuterol (PROVENTIL HFA;VENTOLIN HFA) 108 (90 Base) MCG/ACT inhaler, INHALE 2 PUFFS INTO THE LUNGS EVERY 4 (FOUR) HOURS AS NEEDED FOR WHEEZING OR SHORTNESS OF BREATH., Disp: 6.7 Inhaler, Rfl: 0 .  apixaban (ELIQUIS) 5 MG TABS tablet, Take 1 tablet (5 mg total) by mouth 2 (two) times daily., Disp: 180 tablet, Rfl: 2 .  Ascorbic Acid (VITAMIN C) 1000 MG tablet, Take 1,000 mg by mouth daily., Disp: , Rfl:  .  atorvastatin (LIPITOR) 40 MG tablet, TAKE 1 TABLET BY MOUTH EVERY DAY, Disp: 90 tablet, Rfl: 3 .  Calcium Carbonate-Vitamin D (CALCIUM 600+D PO), Take 1 tablet by mouth daily., Disp: , Rfl:  .  diltiazem (CARDIZEM CD) 240 MG 24 hr capsule, Take 1 capsule (240 mg total) by mouth daily., Disp: 90 capsule, Rfl: 2 .  diphenhydramine-acetaminophen (TYLENOL PM) 25-500 MG TABS tablet, Take 2 tablets  by mouth at bedtime. , Disp: , Rfl:  .  FLUoxetine (PROZAC) 20 MG capsule, TAKE 1 CAPSULE BY MOUTH EVERY DAY, Disp: 90 capsule, Rfl: 0 .  fluticasone (FLONASE) 50 MCG/ACT nasal spray, Place 1 spray into both nostrils at bedtime as needed for allergies. , Disp: , Rfl:  .  KLOR-CON M20 20 MEQ tablet, TAKE 1 TABLET BY MOUTH TWICE A DAY (Patient taking differently: Take 20 mEq by mouth 2 (two) times daily. ), Disp: 180 tablet, Rfl: 3 .  loratadine (CLARITIN) 10 MG tablet, TAKE 1 TABLET BY MOUTH EVERY DAY, Disp: 90 tablet, Rfl: 1 .  Lysine 1000 MG TABS, Take 1,000 mg by mouth at bedtime. , Disp: , Rfl:  .  Multiple Vitamin (MULTIVITAMIN WITH MINERALS) TABS tablet, Take 1 tablet by mouth daily. Centrum, Disp: , Rfl:  .  nitroGLYCERIN (NITROSTAT) 0.4 MG SL tablet, Place 1 tablet (0.4 mg total) under the tongue every 5 (five) minutes x 3 doses as needed for chest pain., Disp: 25 tablet, Rfl: 6 .  nystatin-triamcinolone (MYCOLOG II) cream, Apply 1 application topically 2 (two) times daily as needed., Disp: 30 g, Rfl: 1 .  Polyethyl Glycol-Propyl Glycol (SYSTANE) 0.4-0.3 % SOLN, Place 1 drop into both eyes at bedtime as needed (for dry eyes). , Disp: , Rfl:  .  torsemide (DEMADEX) 20 MG tablet, Take 2 tablets (40 mg total) by mouth 2 (two) times daily., Disp: 360 tablet, Rfl: 1 .  valACYclovir (VALTREX) 1000 MG tablet, Take 1 tablet (1,000 mg total) by mouth every 12 (twelve) hours as needed (cold sores)., Disp: , Rfl:  .  vitamin B-12 (CYANOCOBALAMIN) 500 MCG tablet, Take 500 mcg by mouth at bedtime., Disp: , Rfl:  .  vitamin E 400 UNIT capsule, Take 800 Units by mouth at bedtime. , Disp: , Rfl:   EXAM: BP Readings from Last 3 Encounters:  09/04/18 126/74  08/16/18 124/68  08/12/18 130/78   Wt Readings from Last 3 Encounters:  09/04/18 264 lb 6.4 oz (119.9 kg)  08/16/18 262 lb 6.4 oz (119 kg)  08/12/18 262 lb (118.8 kg)    VITALS per patient if applicable: weight  reports as 165  GENERAL: alert,  oriented, appears well and in no acute distress  HEENT: atraumatic, conjunttiva clear, no obvious abnormalities on inspection of external nose and ears NECK: normal movements of the head and neck LUNGS: on inspection no signs of respiratory distress, breathing rate appears normal, no obvious gross SOB, gasping or wheezing CV: no obvious cyanosis  PSYCH/NEURO: pleasant and cooperative, no obvious depression or anxiety, speech and thought processing grossly intact Lab Results  Component Value Date   WBC 7.3 06/01/2018   HGB 12.3 06/01/2018   HCT 38.1 06/01/2018   PLT 239 06/01/2018   GLUCOSE 119 (H) 08/20/2018   CHOL 101 06/01/2018   TRIG 68 06/01/2018   HDL 48 06/01/2018   LDLCALC 39 06/01/2018   ALT 35 06/01/2018   AST 26 06/01/2018   NA 138 06/24/2018   K 3.4 (L) 06/24/2018   CL 97 06/24/2018   CREATININE 1.09 06/24/2018   BUN 18 06/24/2018   CO2 29 06/24/2018   TSH 1.717 06/01/2018   INR 1.0 05/08/2017   HGBA1C 6.1 (A) 09/04/2018   MICROALBUR <0.7 01/23/2017    ASSESSMENT AND PLAN:  Discussed the following assessment and plan:    ICD-10-CM   1. Pre-diabetes  J33.54 Basic metabolic panel    CBC with Differential/Platelet    Hemoglobin A1c    Hepatic function panel    TSH    Lipid panel  2. Medication management  T62.563 Basic metabolic panel    CBC with Differential/Platelet    Hemoglobin A1c    Hepatic function panel    TSH    Lipid panel  3. BMI 38.0-38.9,adult  S93.73 Basic metabolic panel    CBC with Differential/Platelet    Hemoglobin A1c    Hepatic function panel    TSH    Lipid panel  4. Essential hypertension  S28 Basic metabolic panel    CBC with Differential/Platelet    Hemoglobin A1c    Hepatic function panel    TSH    Lipid panel  5. Intertrigo intermittent  J68.1 Basic metabolic panel    CBC with Differential/Platelet    Hemoglobin A1c    Hepatic function panel    TSH    Lipid panel  6. Anticoagulant long-term use  L57.26 Basic  metabolic panel    CBC with Differential/Platelet    Hemoglobin A1c    Hepatic function panel    TSH    Lipid panel  7. Paroxysmal atrial fibrillation (HCC)  O03.5 Basic metabolic panel    CBC with Differential/Platelet    Hemoglobin A1c    Hepatic function panel    TSH    Lipid panel  8. Fasting hyperglycemia  R73.01    Plan labs and if ok the  Plan fu  6 mos of other  Counseled.   Expectant management and discussion of plan and treatment with opportunity to ask questions and all were answered. The patient agreed with the plan and demonstrated an understanding of the instructions.  due  a1c and flull set of labs for   Nov dec  Advised to call back or seek an in-person evaluation if worsening  or having  further concerns .     [name], MD

## 2019-01-15 DIAGNOSIS — M4316 Spondylolisthesis, lumbar region: Secondary | ICD-10-CM | POA: Diagnosis not present

## 2019-02-03 ENCOUNTER — Telehealth: Payer: Self-pay

## 2019-02-03 NOTE — Telephone Encounter (Signed)

## 2019-02-04 ENCOUNTER — Encounter: Payer: Self-pay | Admitting: Cardiology

## 2019-02-04 ENCOUNTER — Other Ambulatory Visit: Payer: Self-pay

## 2019-02-04 ENCOUNTER — Ambulatory Visit: Payer: PPO | Admitting: Cardiology

## 2019-02-04 VITALS — BP 132/72 | HR 106 | Ht 66.0 in | Wt 265.0 lb

## 2019-02-04 DIAGNOSIS — I4819 Other persistent atrial fibrillation: Secondary | ICD-10-CM

## 2019-02-04 NOTE — Progress Notes (Signed)
Electrophysiology Office Note   Date:  02/04/2019   ID:  Samantha, [name] [birth year], MRN [id number]  PCP:  [name], MD  Primary Electrophysiologist: Gaye Alken, MD    No chief complaint on file.    History of Present Illness: [name] is a 73 y.o. female who presents today for electrophysiology evaluation.   She has a history of coronary artery disease status post stent to the RCA in 2004, CHF, atrial fibrillation with tachybradycardia syndrome status post pacemaker, CKD, hypertension, hyperlipidemia, inferior wall MI, sleep apnea on CPAP, stroke. She is maintaining sinus rhythm on CPAP and dofetilide until she got an infection and when in atrial fibrillation. Had ablation on 09/29/16.  Unfortunately, over the last few months, she has required 2 cardioversions.  She had repeat ablation 02/07/2018.  Since that time, she has had both recurrent atrial fibrillation and atypical atrial flutter.  Today, denies symptoms of palpitations, chest pain, shortness of breath, orthopnea, PND, lower extremity edema, claudication, dizziness, presyncope, syncope, bleeding, or neurologic sequela. The patient is tolerating medications without difficulties.  Overall she feels weak and fatigued.  Unfortunately she is back in atrial fibrillation.   Past Medical History:  Diagnosis Date  . Anticoagulant long-term use    pradaxa  . Anxiety   . Arthritis    "fingers, lower back" (04/23/2017   . CAD (coronary artery disease) [id number]   post PTCA with bare-metal stenting to mid RCA in December 2004     . CHF (congestive heart failure) ([place])   . Chronic atrial fibrillation 06/2007   Tachybradycardia pacemaker  . Chronic kidney disease    10% function - ?R, other kidney is compensating    . CVA (cerebral vascular accident) [facility]) [phone]   denies residual on 04/23/2017  . Depression   . Diplopia 06/19/2008   Qualifier: Diagnosis of  By: [name] MD, [name]   . Dysrhythmia    ATRIAL FIBRILATION  . Edema of lower extremity   . Hyperlipidemia   . Hypertension   . Inferior myocardial infarction [facility])    acute inferior wall mi/other medical hx  . Myocardial infarction ([place]) [id number]  . Obesity   . OSA on CPAP    last test- 2010  . Pacemaker   . Pneumonia 2014   tx. ----  [facility]  . Pulmonary hypertension ([place])    moderate pulmonary hypertension by 10/2016 echo and 10/2013 cardiac cath  . Shortness of breath   . Skin cancer    "cut off right Standing; burned off LLE" (04/23/2017)  . Sleep apnea   . Spondylolisthesis   . TIA (transient ischemic attack) 2008  . Unspecified hemorrhoids without mention of complication 3/00/9233   Colonoscopy--Dr. Carlean Purl    Past Surgical History:  Procedure Laterality Date  . ABDOMINAL HYSTERECTOMY    . APPENDECTOMY  1984  . ATRIAL FIBRILLATION ABLATION N/A 09/29/2016   Procedure: Atrial Fibrillation Ablation;  Surgeon: Aailyah Dunbar Meredith Leeds, MD;  Location: [place] CV LAB;  Service: Cardiovascular;  Laterality: N/A;  . ATRIAL FIBRILLATION ABLATION N/A 02/07/2018   Procedure: ATRIAL FIBRILLATION ABLATION;  Surgeon: Constance Haw, MD;  Location: [place] [facility];  Service: Cardiovascular;  Laterality: N/A;  . BACK SURGERY    . CARDIOVERSION N/A 09/12/2017   Procedure: CARDIOVERSION;  Surgeon: Jerline Pain, MD;  Location: [facility] ENDOSCOPY;  Service: Cardiovascular;  Laterality: N/A;  . CARDIOVERSION N/A 12/13/2017   Procedure: CARDIOVERSION;  Surgeon: Sanda Klein, MD;  Location: [place];  Service: Cardiovascular;  Laterality: N/A;  . CARDIOVERSION N/A 02/18/2018   Procedure: CARDIOVERSION;  Surgeon: Dorothy Spark, MD;  Location: [facility] ENDOSCOPY;  Service: Cardiovascular;  Laterality: N/A;  . CARDIOVERSION N/A 05/20/2018   Procedure: CARDIOVERSION;  Surgeon: [name], MD;  Location: [facility] ENDOSCOPY;  Service: Cardiovascular;  Laterality: N/A;  . CATARACT EXTRACTION W/ INTRAOCULAR LENS  IMPLANT, BILATERAL  Bilateral 01/15/2017- 03/2017  . CHOLECYSTECTOMY    . CORONARY ANGIOPLASTY  X 2  . CORONARY ANGIOPLASTY WITH STENT PLACEMENT  1998; ~ 2007; ?date   "1 stent; replaced stent; not sure when I got the last stent" (04/23/2017)  . DOPPLER ECHOCARDIOGRAPHY  2009  . ELECTROPHYSIOLOGIC STUDY N/A 03/31/2016   Procedure: Cardioversion;  Surgeon: Evans Lance, MD;  Location: [place] [facility] LAB;  Service: Cardiovascular;  Laterality: N/A;  . ELECTROPHYSIOLOGIC STUDY N/A 08/04/2016   Procedure: Cardioversion;  Surgeon: Evans Lance, MD;  Location: [place] [facility] LAB;  Service: Cardiovascular;  Laterality: N/A;  . INSERT / REPLACE / REMOVE PACEMAKER  06/2007  . IR RADIOLOGY PERIPHERAL GUIDED IV START  01/31/2018  . IR US GUIDE VASC ACCESS LEFT  01/31/2018  . JOINT REPLACEMENT    . LAPAROSCOPIC CHOLECYSTECTOMY  1994  . LEFT AND RIGHT HEART CATHETERIZATION WITH CORONARY ANGIOGRAM N/A 10/06/2013   Procedure: LEFT AND RIGHT HEART CATHETERIZATION WITH CORONARY ANGIOGRAM;  Surgeon: Troy Sine, MD;  Location: [facility] CATH LAB;  Service: Cardiovascular;  Laterality: N/A;  . LEFT OOPHORECTOMY Left ~ 1989  . POSTERIOR LUMBAR FUSION  2000s - 04/2015 X 3   L3-4; L4-5; L2-3; Dr Trenton Gammon  . RIGHT/LEFT HEART CATH AND CORONARY ANGIOGRAPHY N/A 05/09/2017   Procedure: RIGHT/LEFT HEART CATH AND CORONARY ANGIOGRAPHY;  Surgeon: Sherren Mocha, MD;  Location: [place] CV LAB;  Service: Cardiovascular;  Laterality: N/A;  . SKIN CANCER EXCISION Right    [place]  . TEE WITHOUT CARDIOVERSION N/A 09/29/2016   Procedure: TRANSESOPHAGEAL ECHOCARDIOGRAM (TEE);  Surgeon: Jerline Pain, MD;  Location: [place];  Service: Cardiovascular;  Laterality: N/A;  . TOTAL ABDOMINAL HYSTERECTOMY  1984   "uterus & right ovary"  . TOTAL KNEE ARTHROPLASTY Left 04/23/2017  . TOTAL KNEE ARTHROPLASTY Left 04/23/2017   Procedure: TOTAL KNEE ARTHROPLASTY;  Surgeon: Frederik Pear, MD;  Location: [place];  Service: Orthopedics;  Laterality: Left;  .  ULTRASOUND GUIDANCE FOR VASCULAR ACCESS  05/09/2017   Procedure: Ultrasound Guidance For Vascular Access;  Surgeon: Sherren Mocha, MD;  Location: [place] [facility] LAB;  Service: Cardiovascular;;     Current Outpatient Medications  Medication Sig Dispense Refill  . acetaminophen (TYLENOL) 500 MG tablet Take 1,000 mg by mouth 2 (two) times daily.     [name] albuterol (PROVENTIL HFA;VENTOLIN HFA) 108 (90 Base) MCG/ACT inhaler INHALE 2 PUFFS INTO THE LUNGS EVERY 4 (FOUR) HOURS AS NEEDED FOR WHEEZING OR SHORTNESS OF BREATH. 6.7 Inhaler 0  . apixaban (ELIQUIS) 5 MG TABS tablet Take 1 tablet (5 mg total) by mouth 2 (two) times daily. 180 tablet 2  . Ascorbic Acid (VITAMIN C) 1000 MG tablet Take 1,000 mg by mouth daily.    [name] atorvastatin (LIPITOR) 40 MG tablet TAKE 1 TABLET BY MOUTH EVERY DAY 90 tablet 3  . Calcium Carbonate-Vitamin D (CALCIUM 600+D PO) Take 1 tablet by mouth daily.    [name] diltiazem (CARDIZEM CD) 240 MG 24 hr capsule Take 1 capsule (240 mg total) by mouth daily. 90 capsule 2  . diphenhydramine-acetaminophen (TYLENOL PM) 25-500  MG TABS tablet Take 2 tablets by mouth at bedtime.     [name] FLUoxetine (PROZAC) 20 MG capsule TAKE 1 CAPSULE BY MOUTH EVERY DAY 90 capsule 0  . fluticasone (FLONASE) 50 MCG/ACT nasal spray Place 1 spray into both nostrils at bedtime as needed for allergies.     [name] KLOR-CON M20 20 MEQ tablet TAKE 1 TABLET BY MOUTH TWICE A DAY (Patient taking differently: Take 20 mEq by mouth 2 (two) times daily. ) 180 tablet 3  . loratadine (CLARITIN) 10 MG tablet TAKE 1 TABLET BY MOUTH EVERY DAY 90 tablet 1  . Lysine 1000 MG TABS Take 1,000 mg by mouth at bedtime.     . Multiple Vitamin (MULTIVITAMIN WITH MINERALS) TABS tablet Take 1 tablet by mouth daily. Centrum    . nitroGLYCERIN (NITROSTAT) 0.4 MG SL tablet Place 1 tablet (0.4 mg total) under the tongue every 5 (five) minutes x 3 doses as needed for chest pain. 25 tablet 6  . nystatin-triamcinolone (MYCOLOG II) cream Apply 1 application  topically 2 (two) times daily as needed. 30 g 1  . Polyethyl Glycol-Propyl Glycol (SYSTANE) 0.4-0.3 % SOLN Place 1 drop into both eyes at bedtime as needed (for dry eyes).     . torsemide (DEMADEX) 20 MG tablet Take 2 tablets (40 mg total) by mouth 2 (two) times daily. 360 tablet 1  . valACYclovir (VALTREX) 1000 MG tablet Take 1 tablet (1,000 mg total) by mouth every 12 (twelve) hours as needed (cold sores).    . vitamin B-12 (CYANOCOBALAMIN) 500 MCG tablet Take 500 mcg by mouth at bedtime.    . vitamin E 400 UNIT capsule Take 800 Units by mouth at bedtime.      No current facility-administered medications for this visit.     Allergies:   Adhesive [tape]   Social History:  The patient  reports that she quit smoking about 36 years ago. Her smoking use included cigarettes. She started smoking about 40 years ago. She has a 5.00 pack-year smoking history. She has never used smokeless tobacco. She reports current alcohol use. She reports that she does not use drugs.   Family History:  The patient's family history includes Arrhythmia in her mother; Colon cancer in her maternal aunt; Dementia in her mother; Diabetes in her mother and paternal grandfather; Heart attack in her brother; Heart disease in her paternal aunt; Hypertension in her mother; Prostate cancer in her maternal grandfather; Suicidality in her father.   ROS:  Please see the history of present illness.   Otherwise, review of systems is positive for none.   All other systems are reviewed and negative.   PHYSICAL EXAM: VS:  BP 132/72   Pulse (!) 106   Ht 5\' 6"  (1.676 m)   Wt 265 lb (120.2 kg)   BMI 42.77 kg/m  , BMI Body mass index is 42.77 kg/m. GEN: Well nourished, well developed, in no acute distress  HEENT: normal  Neck: no JVD, carotid bruits, or masses Cardiac: RRR; no murmurs, rubs, or gallops,no edema  Respiratory:  clear to auscultation bilaterally, normal work of breathing GI: soft, nontender, nondistended, + BS MS: no  deformity or atrophy  Skin: warm and dry Neuro:  Strength and sensation are intact Psych: euthymic mood, full affect  EKG:  EKG is ordered today. Personal review of the ekg ordered  shows atrial fibrillation  Recent Labs: 05/31/2018: B Natriuretic Peptide 145.9 06/01/2018: ALT 35; Hemoglobin 12.3; Magnesium 2.3; Platelets 239; TSH 1.717 06/24/2018: BUN 18;  Creatinine, Ser 1.09; Potassium 3.4; Sodium 138    Lipid Panel     Component Value Date/Time   CHOL 101 06/01/2018 0138   CHOL 116 10/16/2016 0850   TRIG 68 06/01/2018 0138   HDL 48 06/01/2018 0138   HDL 42 10/16/2016 0850   CHOLHDL 2.1 06/01/2018 0138   VLDL 14 06/01/2018 0138   LDLCALC 39 06/01/2018 0138   LDLCALC 46 10/16/2016 0850     Wt Readings from Last 3 Encounters:  02/04/19 265 lb (120.2 kg)  09/04/18 264 lb 6.4 oz (119.9 kg)  08/16/18 262 lb 6.4 oz (119 kg)      Other studies Reviewed: Additional studies/ records that were reviewed today include: TTE 03/28/16  Review of the above records today demonstrates:  - Left ventricle: The cavity size was normal. Wall thickness was   increased in a pattern of mild LVH. Systolic function was normal.   The estimated ejection fraction was in the range of 55% to 60%.   Indeterminant diastolic function (atrial fibrillation). Wall   motion was normal; there were no regional wall motion   abnormalities. - Aortic valve: There was no stenosis. - Mitral valve: Mildly calcified annulus. There was no significant   regurgitation. - Left atrium: The atrium was mildly dilated. - Right ventricle: The cavity size was normal. Pacer wire or   catheter noted in right ventricle. Systolic function was normal. - Tricuspid valve: Peak RV-RA gradient (S): 22 mm Hg. - Pulmonary arteries: PA peak pressure: 30 mm Hg (S). - Systemic veins: IVC measured 2.3 cm with normal respirophasic   variation, suggesting RA pressure 8 mmHg.  LHC/RHC 05/09/17 1.  Single-vessel coronary artery disease  with continued patency of the stented segment in the right coronary artery and chronic occlusion of a small acute marginal branch collateralized by the left coronary artery 2.  Patent left main, LAD, and left circumflex without significant stenosis 3.  Vigorous LV systolic function with mildly elevated LVEDP 4.  Severe pulmonary hypertension with mean pulmonary artery pressure of 48 mmHg.  Because of high cardiac output (8.5 L/min), the patient's pulmonary vascular resistance is only mildly increased at 3 Wood units.  ASSESSMENT AND PLAN:  1.  Persistent atrial fibrillation: Status post ablation x2 most recent 02/07/2018.  Has had atrial fibrillation and typical atrial flutter since.  Currently on amiodarone and diltiazem.  Plan for repeat ablation.  Risks and benefits were discussed include bleeding, tamponade, heart block, stroke, damage surrounding organs.  She understands these risks and is agreed to the procedure.    This patients CHA2DS2-VASc Score and unadjusted Ischemic Stroke Rate (% per year) is equal to 3.2 % stroke rate/year from a score of 3  Above score calculated as 1 point each if present [CHF, HTN, DM, Vascular=MI/PAD/Aortic Plaque, Age if 65-74, or Female] Above score calculated as 2 points each if present [Age > 75, or Stroke/TIA/TE]   2. Hypertension: Currently well controlled  3. Obesity: Weight loss encouraged  4. Chronic diastolic heart failure: No signs of volume overload  5. Obstructive sleep apnea: CPAP compliance encouraged  6.  Sinus node dysfunction: That is post Public house manager.  Has a high bipolar impedance and thus has been switched to unipolar.  Device functioning appropriately unipolar without issues.  No changes.    Current medicines are reviewed at length with the patient today.   The patient does not have concerns regarding her medicines.  The following changes were made today: None  Labs/  tests ordered today include:  Orders Placed  This Encounter  Procedures  . EKG 12-Lead     Disposition:   FU with Tamanika Heiney 3 months  Signed, Nelwyn Hebdon Meredith Leeds, MD  02/04/2019 11:52 AM     [facility] [address] [place] [address] [phone] (office) [phone] (fax)

## 2019-02-05 ENCOUNTER — Other Ambulatory Visit: Payer: Self-pay | Admitting: Cardiology

## 2019-02-13 ENCOUNTER — Other Ambulatory Visit: Payer: Self-pay | Admitting: Cardiovascular Disease

## 2019-02-14 LAB — CUP PACEART INCLINIC DEVICE CHECK
Date Time Interrogation Session: 20200814161256
Implantable Lead Implant Date: 20081211
Implantable Lead Implant Date: 20081211
Implantable Lead Location: 753859
Implantable Lead Location: 753860
Implantable Pulse Generator Implant Date: 20081211
Pulse Gen Model: 5826
Pulse Gen Serial Number: 1999089

## 2019-02-21 ENCOUNTER — Telehealth: Payer: Self-pay | Admitting: *Deleted

## 2019-02-21 DIAGNOSIS — Z01812 Encounter for preprocedural laboratory examination: Secondary | ICD-10-CM

## 2019-02-21 DIAGNOSIS — I4819 Other persistent atrial fibrillation: Secondary | ICD-10-CM

## 2019-02-21 MED ORDER — METOPROLOL TARTRATE 100 MG PO TABS: ORAL_TABLET | ORAL | 0 refills | Status: DC

## 2019-02-21 NOTE — Telephone Encounter (Signed)
CT, Covid & ablation instructions reviewed w/ pt. She will stop by the office next week to pick up instruction letter and have pre procedure blood work (she is unable to access her Mychart acct at this time. Number to MyChart IT given to pt to address issue). COVID testing scheduled for 9/8 and directions/instructions reviewed. Aware office will contact her to arrange CT and post procedure follow up. Patient verbalized understanding and agreeable to plan.

## 2019-02-21 NOTE — Addendum Note (Signed)
Addended by: Stanton Kidney on: 02/21/2019 12:09 PM   Modules accepted: Orders

## 2019-02-26 ENCOUNTER — Other Ambulatory Visit: Payer: Self-pay

## 2019-02-26 ENCOUNTER — Other Ambulatory Visit: Payer: PPO | Admitting: *Deleted

## 2019-02-26 DIAGNOSIS — I4819 Other persistent atrial fibrillation: Secondary | ICD-10-CM

## 2019-02-26 DIAGNOSIS — Z01812 Encounter for preprocedural laboratory examination: Secondary | ICD-10-CM

## 2019-02-26 DIAGNOSIS — Z6841 Body Mass Index (BMI) 40.0 and over, adult: Secondary | ICD-10-CM | POA: Diagnosis not present

## 2019-02-26 DIAGNOSIS — M4316 Spondylolisthesis, lumbar region: Secondary | ICD-10-CM | POA: Diagnosis not present

## 2019-02-26 LAB — BASIC METABOLIC PANEL
BUN/Creatinine Ratio: 14 (ref 12–28)
BUN: 15 mg/dL (ref 8–27)
CO2: 27 mmol/L (ref 20–29)
Calcium: 9.8 mg/dL (ref 8.7–10.3)
Chloride: 96 mmol/L (ref 96–106)
Creatinine, Ser: 1.06 mg/dL — ABNORMAL HIGH (ref 0.57–1.00)
GFR calc Af Amer: 60 mL/min/{1.73_m2} (ref >59)
GFR calc non Af Amer: 52 mL/min/{1.73_m2} — ABNORMAL LOW (ref >59)
Glucose: 101 mg/dL — ABNORMAL HIGH (ref 65–99)
Potassium: 4.6 mmol/L (ref 3.5–5.2)
Sodium: 139 mmol/L (ref 134–144)

## 2019-02-26 LAB — CBC
Hematocrit: 46.3 % (ref 34.0–46.6)
Hemoglobin: 16 g/dL — ABNORMAL HIGH (ref 11.1–15.9)
MCH: 33.1 pg — ABNORMAL HIGH (ref 26.6–33.0)
MCHC: 34.6 g/dL (ref 31.5–35.7)
MCV: 96 fL (ref 79–97)
Platelets: 314 10*3/uL (ref 150–450)
RBC: 4.83 x10E6/uL (ref 3.77–5.28)
RDW: 12.4 % (ref 11.7–15.4)
WBC: 9.8 10*3/uL (ref 3.4–10.8)

## 2019-03-05 ENCOUNTER — Telehealth: Payer: Self-pay | Admitting: *Deleted

## 2019-03-05 ENCOUNTER — Telehealth (HOSPITAL_COMMUNITY): Payer: Self-pay | Admitting: Emergency Medicine

## 2019-03-05 NOTE — Telephone Encounter (Signed)
Pt advised to take Lopressor 50 mg x 1 dose two hours prior to her cardiac CT on 9/3. Patient verbalized understanding and agreeable to plan.

## 2019-03-05 NOTE — Telephone Encounter (Signed)
Reaching out to patient to offer assistance regarding upcoming cardiac imaging study; pt verbalizes understanding of appt date/time, parking situation and where to check in, pre-test NPO status and medications ordered, and verified current allergies; name and call back number provided for further questions should they arise Gurnie Duris RN Navigator Cardiac Imaging [facility] [phone] office [phone] cell 

## 2019-03-06 ENCOUNTER — Ambulatory Visit (HOSPITAL_COMMUNITY)
Admission: RE | Admit: 2019-03-06 | Discharge: 2019-03-06 | Disposition: A | Discharge status: Home / Self Care | Payer: PPO | Source: Ambulatory Visit | Attending: Cardiology | Admitting: Cardiology

## 2019-03-06 ENCOUNTER — Encounter (HOSPITAL_COMMUNITY): Payer: Self-pay

## 2019-03-06 ENCOUNTER — Ambulatory Visit (HOSPITAL_COMMUNITY): Payer: PPO

## 2019-03-06 ENCOUNTER — Other Ambulatory Visit: Payer: Self-pay

## 2019-03-06 DIAGNOSIS — I4819 Other persistent atrial fibrillation: Secondary | ICD-10-CM | POA: Diagnosis not present

## 2019-03-06 MED ORDER — IOHEXOL 350 MG/ML SOLN
80.0000 mL | Freq: Once | INTRAVENOUS | Status: AC | PRN
  Administered 2019-03-06: 80 mL via INTRAVENOUS

## 2019-03-11 ENCOUNTER — Other Ambulatory Visit (HOSPITAL_COMMUNITY)
Admission: RE | Admit: 2019-03-11 | Discharge: 2019-03-11 | Disposition: A | Discharge status: Home / Self Care | Payer: PPO | Source: Ambulatory Visit | Attending: Cardiology | Admitting: Cardiology

## 2019-03-11 DIAGNOSIS — Z01812 Encounter for preprocedural laboratory examination: Secondary | ICD-10-CM | POA: Diagnosis not present

## 2019-03-11 DIAGNOSIS — Z20828 Contact with and (suspected) exposure to other viral communicable diseases: Secondary | ICD-10-CM | POA: Insufficient documentation

## 2019-03-11 LAB — SARS CORONAVIRUS 2 (TAT 6-24 HRS): SARS Coronavirus 2: NEGATIVE

## 2019-03-12 NOTE — Anesthesia Preprocedure Evaluation (Addendum)
Anesthesia Evaluation  Patient identified by MRN, date of birth, ID band Patient awake    Reviewed: Allergy & Precautions, NPO status , Patient's Chart, lab work & pertinent test results  History of Anesthesia Complications Negative for: history of anesthetic complications  Airway Mallampati: I  TM Distance: >3 FB Neck ROM: Full    Dental  (+) Teeth Intact   Pulmonary sleep apnea and Continuous Positive Airway Pressure Ventilation , former smoker,    Pulmonary exam normal        Cardiovascular hypertension, Pt. on medications + CAD, + Past MI, + Cardiac Stents and +CHF  + dysrhythmias Atrial Fibrillation + pacemaker  Rhythm:Irregular Rate:Tachycardia   '18 Cath - Single-vessel coronary artery disease with continued patency of the stented segment in the right coronary artery and chronic occlusion of a small acute marginal branch collateralized by the left coronary artery 2.  Patent left main, LAD, and left circumflex without significant stenosis 3.  Vigorous LV systolic function with mildly elevated LVEDP 4.  Severe pulmonary hypertension with mean pulmonary artery pressure of 48 mmHg.  Because of high cardiac output (8.5 L/min), the patient's pulmonary vascular resistance is only mildly increased at 3 Wood units.  '18 TTE - Moderate concentric LVH. EF 55% to 60%. Grade 2 diastolic dysfunction. Mild MR. Left atrium was moderately dilated. PA peak pressure: 53 mm Hg    Neuro/Psych PSYCHIATRIC DISORDERS Anxiety Depression  Diplopia  TIACVA, No Residual Symptoms    GI/Hepatic negative GI ROS, Neg liver ROS,   Endo/Other  diabetesMorbid obesity  Renal/GU CRFRenal disease     Musculoskeletal  (+) Arthritis ,   Abdominal   Peds  Hematology negative hematology ROS (+)   Anesthesia Other Findings   Reproductive/Obstetrics                           Anesthesia Physical  Anesthesia Plan  ASA:  III  Anesthesia Plan: General   Post-op Pain Management:    Induction: Intravenous  PONV Risk Score and Plan: 3 and Treatment may vary due to age or medical condition, Ondansetron, Dexamethasone and Midazolam  Airway Management Planned: Oral ETT  Additional Equipment: None  Intra-op Plan:   Post-operative Plan: Extubation in OR  Informed Consent: I have reviewed the patients History and Physical, chart, labs and discussed the procedure including the risks, benefits and alternatives for the proposed anesthesia with the patient or authorized representative who has indicated his/her understanding and acceptance.     Dental advisory given  Plan Discussed with:   Anesthesia Plan Comments:        Anesthesia Quick Evaluation

## 2019-03-13 ENCOUNTER — Ambulatory Visit (HOSPITAL_COMMUNITY): Payer: PPO | Admitting: Anesthesiology

## 2019-03-13 ENCOUNTER — Other Ambulatory Visit: Payer: Self-pay

## 2019-03-13 ENCOUNTER — Ambulatory Visit (HOSPITAL_COMMUNITY)
Admission: RE | Admit: 2019-03-13 | Discharge: 2019-03-13 | Disposition: A | Discharge status: Home / Self Care | Payer: PPO | Attending: Cardiology | Admitting: Cardiology

## 2019-03-13 ENCOUNTER — Encounter (HOSPITAL_COMMUNITY)
Admission: RE | Disposition: A | Discharge status: Home / Self Care | Payer: Self-pay | Source: Home / Self Care | Attending: Cardiology

## 2019-03-13 DIAGNOSIS — Z6841 Body Mass Index (BMI) 40.0 and over, adult: Secondary | ICD-10-CM | POA: Insufficient documentation

## 2019-03-13 DIAGNOSIS — F419 Anxiety disorder, unspecified: Secondary | ICD-10-CM | POA: Insufficient documentation

## 2019-03-13 DIAGNOSIS — F17210 Nicotine dependence, cigarettes, uncomplicated: Secondary | ICD-10-CM | POA: Insufficient documentation

## 2019-03-13 DIAGNOSIS — Z950 Presence of cardiac pacemaker: Secondary | ICD-10-CM | POA: Diagnosis not present

## 2019-03-13 DIAGNOSIS — E785 Hyperlipidemia, unspecified: Secondary | ICD-10-CM | POA: Diagnosis not present

## 2019-03-13 DIAGNOSIS — Z8673 Personal history of transient ischemic attack (TIA), and cerebral infarction without residual deficits: Secondary | ICD-10-CM | POA: Insufficient documentation

## 2019-03-13 DIAGNOSIS — N189 Chronic kidney disease, unspecified: Secondary | ICD-10-CM | POA: Diagnosis not present

## 2019-03-13 DIAGNOSIS — G4733 Obstructive sleep apnea (adult) (pediatric): Secondary | ICD-10-CM | POA: Diagnosis not present

## 2019-03-13 DIAGNOSIS — I2510 Atherosclerotic heart disease of native coronary artery without angina pectoris: Secondary | ICD-10-CM | POA: Diagnosis not present

## 2019-03-13 DIAGNOSIS — E1122 Type 2 diabetes mellitus with diabetic chronic kidney disease: Secondary | ICD-10-CM | POA: Insufficient documentation

## 2019-03-13 DIAGNOSIS — Z955 Presence of coronary angioplasty implant and graft: Secondary | ICD-10-CM | POA: Diagnosis not present

## 2019-03-13 DIAGNOSIS — I483 Typical atrial flutter: Secondary | ICD-10-CM | POA: Diagnosis not present

## 2019-03-13 DIAGNOSIS — I495 Sick sinus syndrome: Secondary | ICD-10-CM | POA: Diagnosis not present

## 2019-03-13 DIAGNOSIS — I5032 Chronic diastolic (congestive) heart failure: Secondary | ICD-10-CM | POA: Insufficient documentation

## 2019-03-13 DIAGNOSIS — Z8249 Family history of ischemic heart disease and other diseases of the circulatory system: Secondary | ICD-10-CM | POA: Diagnosis not present

## 2019-03-13 DIAGNOSIS — E669 Obesity, unspecified: Secondary | ICD-10-CM | POA: Insufficient documentation

## 2019-03-13 DIAGNOSIS — M1990 Unspecified osteoarthritis, unspecified site: Secondary | ICD-10-CM | POA: Insufficient documentation

## 2019-03-13 DIAGNOSIS — Z888 Allergy status to other drugs, medicaments and biological substances status: Secondary | ICD-10-CM | POA: Diagnosis not present

## 2019-03-13 DIAGNOSIS — I5033 Acute on chronic diastolic (congestive) heart failure: Secondary | ICD-10-CM | POA: Diagnosis not present

## 2019-03-13 DIAGNOSIS — I252 Old myocardial infarction: Secondary | ICD-10-CM | POA: Diagnosis not present

## 2019-03-13 DIAGNOSIS — I130 Hypertensive heart and chronic kidney disease with heart failure and stage 1 through stage 4 chronic kidney disease, or unspecified chronic kidney disease: Secondary | ICD-10-CM | POA: Diagnosis not present

## 2019-03-13 DIAGNOSIS — Z7901 Long term (current) use of anticoagulants: Secondary | ICD-10-CM | POA: Diagnosis not present

## 2019-03-13 DIAGNOSIS — I272 Pulmonary hypertension, unspecified: Secondary | ICD-10-CM | POA: Diagnosis not present

## 2019-03-13 DIAGNOSIS — I4819 Other persistent atrial fibrillation: Secondary | ICD-10-CM | POA: Insufficient documentation

## 2019-03-13 HISTORY — PX: ATRIAL FIBRILLATION ABLATION: EP1191

## 2019-03-13 LAB — POCT ACTIVATED CLOTTING TIME
Activated Clotting Time: 268 seconds
Activated Clotting Time: 158 seconds
Activated Clotting Time: 312 seconds

## 2019-03-13 SURGERY — ATRIAL FIBRILLATION ABLATION
Anesthesia: General

## 2019-03-13 MED ORDER — SODIUM CHLORIDE 0.9 % IV SOLN
INTRAVENOUS | Status: DC | PRN
  Administered 2019-03-13: 30 ug/min via INTRAVENOUS

## 2019-03-13 MED ORDER — PROPOFOL 10 MG/ML IV BOLUS
INTRAVENOUS | Status: DC | PRN
  Administered 2019-03-13: 160 mg via INTRAVENOUS

## 2019-03-13 MED ORDER — MIDAZOLAM HCL 2 MG/2ML IJ SOLN
INTRAMUSCULAR | Status: DC | PRN
  Administered 2019-03-13 (×2): 1 mg via INTRAVENOUS

## 2019-03-13 MED ORDER — HEPARIN (PORCINE) IN NACL 1000-0.9 UT/500ML-% IV SOLN
INTRAVENOUS | Status: AC
  Filled 2019-03-13: qty 500

## 2019-03-13 MED ORDER — BUPIVACAINE HCL (PF) 0.25 % IJ SOLN
INTRAMUSCULAR | Status: AC
  Filled 2019-03-13: qty 60

## 2019-03-13 MED ORDER — ADENOSINE 6 MG/2ML IV SOLN
INTRAVENOUS | Status: AC
  Filled 2019-03-13: qty 4

## 2019-03-13 MED ORDER — LIDOCAINE 2% (20 MG/ML) 5 ML SYRINGE
INTRAMUSCULAR | Status: DC | PRN
  Administered 2019-03-13: 100 mg via INTRAVENOUS

## 2019-03-13 MED ORDER — SODIUM CHLORIDE 0.9% FLUSH: 3.0000 mL | INTRAVENOUS | Status: DC | PRN

## 2019-03-13 MED ORDER — HEPARIN SODIUM (PORCINE) 1000 UNIT/ML IJ SOLN
INTRAMUSCULAR | Status: DC | PRN
  Administered 2019-03-13: 15000 [IU] via INTRAVENOUS
  Administered 2019-03-13: 5000 [IU] via INTRAVENOUS

## 2019-03-13 MED ORDER — PROTAMINE SULFATE 10 MG/ML IV SOLN
INTRAVENOUS | Status: DC | PRN
  Administered 2019-03-13: 50 mg via INTRAVENOUS

## 2019-03-13 MED ORDER — HEPARIN (PORCINE) IN NACL 1000-0.9 UT/500ML-% IV SOLN
INTRAVENOUS | Status: DC | PRN
  Administered 2019-03-13 (×5): 500 mL

## 2019-03-13 MED ORDER — ONDANSETRON HCL 4 MG/2ML IJ SOLN
INTRAMUSCULAR | Status: DC | PRN
  Administered 2019-03-13: 4 mg via INTRAVENOUS

## 2019-03-13 MED ORDER — SODIUM CHLORIDE 0.9% FLUSH: 3.0000 mL | Freq: Two times a day (BID) | INTRAVENOUS | Status: DC

## 2019-03-13 MED ORDER — ROCURONIUM BROMIDE 10 MG/ML (PF) SYRINGE
PREFILLED_SYRINGE | INTRAVENOUS | Status: DC | PRN
  Administered 2019-03-13: 10 mg via INTRAVENOUS
  Administered 2019-03-13: 60 mg via INTRAVENOUS
  Administered 2019-03-13: 10 mg via INTRAVENOUS
  Administered 2019-03-13: 20 mg via INTRAVENOUS

## 2019-03-13 MED ORDER — ADENOSINE 6 MG/2ML IV SOLN
INTRAVENOUS | Status: DC | PRN
  Administered 2019-03-13 (×2): 12 mg via INTRAVENOUS

## 2019-03-13 MED ORDER — FENTANYL CITRATE (PF) 250 MCG/5ML IJ SOLN
INTRAMUSCULAR | Status: DC | PRN
  Administered 2019-03-13: 100 ug via INTRAVENOUS

## 2019-03-13 MED ORDER — SODIUM CHLORIDE 0.9 % IV SOLN: 250.0000 mL | INTRAVENOUS | Status: DC | PRN

## 2019-03-13 MED ORDER — ARTIFICIAL TEARS OPHTHALMIC OINT
TOPICAL_OINTMENT | OPHTHALMIC | Status: DC | PRN
  Administered 2019-03-13: 1 via OPHTHALMIC

## 2019-03-13 MED ORDER — SUCCINYLCHOLINE CHLORIDE 200 MG/10ML IV SOSY
PREFILLED_SYRINGE | INTRAVENOUS | Status: DC | PRN
  Administered 2019-03-13: 100 mg via INTRAVENOUS

## 2019-03-13 MED ORDER — PHENYLEPHRINE 40 MCG/ML (10ML) SYRINGE FOR IV PUSH (FOR BLOOD PRESSURE SUPPORT)
PREFILLED_SYRINGE | INTRAVENOUS | Status: DC | PRN
  Administered 2019-03-13: 80 ug via INTRAVENOUS
  Administered 2019-03-13: 120 ug via INTRAVENOUS
  Administered 2019-03-13: 80 ug via INTRAVENOUS

## 2019-03-13 MED ORDER — DEXAMETHASONE SODIUM PHOSPHATE 10 MG/ML IJ SOLN
INTRAMUSCULAR | Status: DC | PRN
  Administered 2019-03-13: 10 mg via INTRAVENOUS

## 2019-03-13 MED ORDER — SODIUM CHLORIDE 0.9 % IV SOLN
INTRAVENOUS | Status: DC
  Administered 2019-03-13: 06:00:00 via INTRAVENOUS

## 2019-03-13 MED ORDER — SUGAMMADEX SODIUM 200 MG/2ML IV SOLN
INTRAVENOUS | Status: DC | PRN
  Administered 2019-03-13: 200 mg via INTRAVENOUS

## 2019-03-13 MED ORDER — BUPIVACAINE HCL (PF) 0.25 % IJ SOLN
INTRAMUSCULAR | Status: DC | PRN
  Administered 2019-03-13: 45 mL

## 2019-03-13 SURGICAL SUPPLY — 22 items
BLANKET WARM UNDERBOD FULL ACC (MISCELLANEOUS) ×3 IMPLANT
CATH MAPPNG PENTARAY F 2-6-2MM (CATHETERS) IMPLANT
CATH SMTCH THERMOCOOL SF DF (CATHETERS) ×2 IMPLANT
CATH SOUNDSTAR ECO REPROCESSED (CATHETERS) ×2 IMPLANT
CATH WEBSTER BI DIR CS D-F CRV (CATHETERS) ×2 IMPLANT
COVER SWIFTLINK CONNECTOR (BAG) ×3 IMPLANT
PACK EP LATEX FREE (CUSTOM PROCEDURE TRAY) ×3
PACK EP LF (CUSTOM PROCEDURE TRAY) ×1 IMPLANT
PAD PRO RADIOLUCENT 2001M-C (PAD) ×3 IMPLANT
PATCH CARTO3 (PAD) ×2 IMPLANT
PENTARAY F 2-6-2MM (CATHETERS) ×3
PROTECTION STATION PRESSURIZED (MISCELLANEOUS) ×3
SHEATH AVANTI 11F 11CM (SHEATH) ×2 IMPLANT
SHEATH BAYLIS SUREFLEX  M 8.5 (SHEATH) ×2
SHEATH BAYLIS SUREFLEX M 8.5 (SHEATH) IMPLANT
SHEATH BAYLIS TRANSSEPTAL 98CM (NEEDLE) ×2 IMPLANT
SHEATH CARTO VIZIGO LRG CVD (SHEATH) ×2 IMPLANT
SHEATH PINNACLE 7F 10CM (SHEATH) ×2 IMPLANT
SHEATH PINNACLE 8F 10CM (SHEATH) ×4 IMPLANT
SHEATH PINNACLE 9F 10CM (SHEATH) ×4 IMPLANT
STATION PROTECTION PRESSURIZED (MISCELLANEOUS) IMPLANT
TUBING SMART ABLATE COOLFLOW (TUBING) ×2 IMPLANT

## 2019-03-13 NOTE — Progress Notes (Signed)
Site area: left groin fv sheaths x2 Site Prior to Removal:  Level 0 Pressure Applied For: 15 minutes Manual:   yes Patient Status During Pull:  stable Post Pull Site:  Level  0 Post Pull Instructions Given:  yes Post Pull Pulses Present: left dp palpable Dressing Applied:  Gauze and tegaderm Bedrest begins @ 4332 Comments:  IV saline locked

## 2019-03-13 NOTE — H&P (Signed)
Electrophysiology Office Note   Date:  03/13/2019   ID:  [name], DOB 08-21-1945, MRN [id number]  PCP:  [name], MD  Primary Electrophysiologist: Gaye Alken, MD    No chief complaint on file.    History of Present Illness: [name] is a 73 y.o. female who presents today for electrophysiology evaluation.   She has a history of coronary artery disease status post stent to the RCA in 2004, CHF, atrial fibrillation with tachybradycardia syndrome status post pacemaker, CKD, hypertension, hyperlipidemia, inferior wall MI, sleep apnea on CPAP, stroke. She is maintaining sinus rhythm on CPAP and dofetilide until she got an infection and when in atrial fibrillation. Had ablation on 09/29/16.  Unfortunately, over the last few months, she has required 2 cardioversions.  She had repeat ablation 02/07/2018.  Since that time, she has had both recurrent atrial fibrillation and atypical atrial flutter.  Today, denies symptoms of palpitations, chest pain, shortness of breath, orthopnea, PND, lower extremity edema, claudication, dizziness, presyncope, syncope, bleeding, or neurologic sequela. The patient is tolerating medications without difficulties. Plan for AF ablation today.    Past Medical History:  Diagnosis Date  . Anticoagulant long-term use    pradaxa  . Anxiety   . Arthritis    "fingers, lower back" (04/23/2017   . CAD (coronary artery disease) 3532,9924   post PTCA with bare-metal stenting to mid RCA in December 2004     . CHF (congestive heart failure) ([place])   . Chronic atrial fibrillation 06/2007   Tachybradycardia pacemaker  . Chronic kidney disease    10% function - ?R, other kidney is compensating    . CVA (cerebral vascular accident) [facility]) [id number]   denies residual on 04/23/2017  . Depression   . Diplopia 06/19/2008   Qualifier: Diagnosis of  By: [name] MD, [name]   . Dysrhythmia    ATRIAL FIBRILATION  . Edema of lower extremity   .  Hyperlipidemia   . Hypertension   . Inferior myocardial infarction [place])    acute inferior wall mi/other medical hx  . Myocardial infarction ([place]) [id number]  . Obesity   . OSA on CPAP    last test- 2010  . Pacemaker   . Pneumonia 2014   tx. ----  [facility]  . Pulmonary hypertension ([place])    moderate pulmonary hypertension by 10/2016 echo and 10/2013 cardiac cath  . Shortness of breath   . Skin cancer    "cut off right Seckinger; burned off LLE" (04/23/2017)  . Sleep apnea   . Spondylolisthesis   . TIA (transient ischemic attack) 2008  . Unspecified hemorrhoids without mention of complication 08/24/9796   Colonoscopy--Dr. Carlean Purl    Past Surgical History:  Procedure Laterality Date  . ABDOMINAL HYSTERECTOMY    . APPENDECTOMY  1984  . ATRIAL FIBRILLATION ABLATION N/A 09/29/2016   Procedure: Atrial Fibrillation Ablation;  Surgeon: Ferrell Flam Meredith Leeds, MD;  Location: [place] CV LAB;  Service: Cardiovascular;  Laterality: N/A;  . ATRIAL FIBRILLATION ABLATION N/A 02/07/2018   Procedure: ATRIAL FIBRILLATION ABLATION;  Surgeon: Constance Haw, MD;  Location: [place] CV LAB;  Service: Cardiovascular;  Laterality: N/A;  . BACK SURGERY    . CARDIOVERSION N/A 09/12/2017   Procedure: CARDIOVERSION;  Surgeon: Jerline Pain, MD;  Location: [facility] ENDOSCOPY;  Service: Cardiovascular;  Laterality: N/A;  . CARDIOVERSION N/A 12/13/2017   Procedure: CARDIOVERSION;  Surgeon: Sanda Klein, MD;  Location: [facility] ENDOSCOPY;  Service: Cardiovascular;  Laterality:  N/A;  . CARDIOVERSION N/A 02/18/2018   Procedure: CARDIOVERSION;  Surgeon: Dorothy Spark, MD;  Location: [facility] ENDOSCOPY;  Service: Cardiovascular;  Laterality: N/A;  . CARDIOVERSION N/A 05/20/2018   Procedure: CARDIOVERSION;  Surgeon: [name], MD;  Location: [facility] ENDOSCOPY;  Service: Cardiovascular;  Laterality: N/A;  . CATARACT EXTRACTION W/ INTRAOCULAR LENS  IMPLANT, BILATERAL Bilateral 01/15/2017- 03/2017  . CHOLECYSTECTOMY     . CORONARY ANGIOPLASTY  X 2  . CORONARY ANGIOPLASTY WITH STENT PLACEMENT  1998; ~ 2007; ?date   "1 stent; replaced stent; not sure when I got the last stent" (04/23/2017)  . DOPPLER ECHOCARDIOGRAPHY  2009  . ELECTROPHYSIOLOGIC STUDY N/A 03/31/2016   Procedure: Cardioversion;  Surgeon: Evans Lance, MD;  Location: [place] CV LAB;  Service: Cardiovascular;  Laterality: N/A;  . ELECTROPHYSIOLOGIC STUDY N/A 08/04/2016   Procedure: Cardioversion;  Surgeon: Evans Lance, MD;  Location: [place] CV LAB;  Service: Cardiovascular;  Laterality: N/A;  . INSERT / REPLACE / REMOVE PACEMAKER  06/2007  . IR RADIOLOGY PERIPHERAL GUIDED IV START  01/31/2018  . IR US GUIDE VASC ACCESS LEFT  01/31/2018  . JOINT REPLACEMENT    . LAPAROSCOPIC CHOLECYSTECTOMY  1994  . LEFT AND RIGHT HEART CATHETERIZATION WITH CORONARY ANGIOGRAM N/A 10/06/2013   Procedure: LEFT AND RIGHT HEART CATHETERIZATION WITH CORONARY ANGIOGRAM;  Surgeon: Troy Sine, MD;  Location: [facility] CATH LAB;  Service: Cardiovascular;  Laterality: N/A;  . LEFT OOPHORECTOMY Left ~ 1989  . POSTERIOR LUMBAR FUSION  2000s - 04/2015 X 3   L3-4; L4-5; L2-3; Dr Trenton Gammon  . RIGHT/LEFT HEART CATH AND CORONARY ANGIOGRAPHY N/A 05/09/2017   Procedure: RIGHT/LEFT HEART CATH AND CORONARY ANGIOGRAPHY;  Surgeon: Sherren Mocha, MD;  Location: [place] CV LAB;  Service: Cardiovascular;  Laterality: N/A;  . SKIN CANCER EXCISION Right    [place]  . TEE WITHOUT CARDIOVERSION N/A 09/29/2016   Procedure: TRANSESOPHAGEAL ECHOCARDIOGRAM (TEE);  Surgeon: Jerline Pain, MD;  Location: [place];  Service: Cardiovascular;  Laterality: N/A;  . TOTAL ABDOMINAL HYSTERECTOMY  1984   "uterus & right ovary"  . TOTAL KNEE ARTHROPLASTY Left 04/23/2017  . TOTAL KNEE ARTHROPLASTY Left 04/23/2017   Procedure: TOTAL KNEE ARTHROPLASTY;  Surgeon: Frederik Pear, MD;  Location: [place];  Service: Orthopedics;  Laterality: Left;  . ULTRASOUND GUIDANCE FOR VASCULAR ACCESS  05/09/2017    Procedure: Ultrasound Guidance For Vascular Access;  Surgeon: Sherren Mocha, MD;  Location: [place] CV LAB;  Service: Cardiovascular;;     Current Facility-Administered Medications  Medication Dose Route Frequency Provider Last Rate Last Dose  . 0.9 %  sodium chloride infusion   Intravenous Continuous Constance Haw, MD 50 mL/hr at 03/13/19 7680      Allergies:   Adhesive [tape]   Social History:  The patient  reports that she quit smoking about 36 years ago. Her smoking use included cigarettes. She started smoking about 40 years ago. She has a 5.00 pack-year smoking history. She has never used smokeless tobacco. She reports current alcohol use. She reports that she does not use drugs.   Family History:  The patient's family history includes Arrhythmia in her mother; Colon cancer in her maternal aunt; Dementia in her mother; Diabetes in her mother and paternal grandfather; Heart attack in her brother; Heart disease in her paternal aunt; Hypertension in her mother; Prostate cancer in her maternal grandfather; Suicidality in her father.   Today's Vitals   03/13/19 0529 03/13/19 0605  BP: 139/82  Pulse: (!) 105   Resp: 18   Temp: (!) 97.2 F (36.2 C)   SpO2: 95%   Weight: 117.9 kg   Height: 5' 6.5" (1.689 m)   PainSc:  0-No pain   Body mass index is 41.34 kg/m.  GEN: Well nourished, well developed, in no acute distress  HEENT: normal  Neck: no JVD, carotid bruits, or masses Cardiac: iRRR; no murmurs, rubs, or gallops,no edema  Respiratory:  clear to auscultation bilaterally, normal work of breathing GI: soft, nontender, nondistended, + BS MS: no deformity or atrophy  Skin: warm and dry Neuro:  Strength and sensation are intact Psych: euthymic mood, full affect   Recent Labs: 05/31/2018: B Natriuretic Peptide 145.9 06/01/2018: ALT 35; Magnesium 2.3; TSH 1.717 02/26/2019: BUN 15; Creatinine, Ser 1.06; Hemoglobin 16.0; Platelets 314; Potassium 4.6; Sodium 139     Lipid Panel     Component Value Date/Time   CHOL 101 06/01/2018 0138   CHOL 116 10/16/2016 0850   TRIG 68 06/01/2018 0138   HDL 48 06/01/2018 0138   HDL 42 10/16/2016 0850   CHOLHDL 2.1 06/01/2018 0138   VLDL 14 06/01/2018 0138   LDLCALC 39 06/01/2018 0138   LDLCALC 46 10/16/2016 0850     Wt Readings from Last 3 Encounters:  03/13/19 117.9 kg  02/04/19 120.2 kg  09/04/18 119.9 kg      Other studies Reviewed: Additional studies/ records that were reviewed today include: TTE 03/28/16  Review of the above records today demonstrates:  - Left ventricle: The cavity size was normal. Wall thickness was   increased in a pattern of mild LVH. Systolic function was normal.   The estimated ejection fraction was in the range of 55% to 60%.   Indeterminant diastolic function (atrial fibrillation). Wall   motion was normal; there were no regional wall motion   abnormalities. - Aortic valve: There was no stenosis. - Mitral valve: Mildly calcified annulus. There was no significant   regurgitation. - Left atrium: The atrium was mildly dilated. - Right ventricle: The cavity size was normal. Pacer wire or   catheter noted in right ventricle. Systolic function was normal. - Tricuspid valve: Peak RV-RA gradient (S): 22 mm Hg. - Pulmonary arteries: PA peak pressure: 30 mm Hg (S). - Systemic veins: IVC measured 2.3 cm with normal respirophasic   variation, suggesting RA pressure 8 mmHg.  LHC/RHC 05/09/17 1.  Single-vessel coronary artery disease with continued patency of the stented segment in the right coronary artery and chronic occlusion of a small acute marginal branch collateralized by the left coronary artery 2.  Patent left main, LAD, and left circumflex without significant stenosis 3.  Vigorous LV systolic function with mildly elevated LVEDP 4.  Severe pulmonary hypertension with mean pulmonary artery pressure of 48 mmHg.  Because of high cardiac output (8.5 L/min), the patient's  pulmonary vascular resistance is only mildly increased at 3 [place] units.  ASSESSMENT AND PLAN:  1.  Persistent atrial fibrillation: Status post ablation x2 most recent 02/07/2018.  Has had atrial fibrillation and typical atrial flutter since.  Currently on amiodarone and diltiazem.  Plan for repeat ablation.  Risks and benefits were discussed include bleeding, tamponade, heart block, stroke, damage surrounding organs.  She understands these risks and is agreed to the procedure.    This patients CHA2DS2-VASc Score and unadjusted Ischemic Stroke Rate (% per year) is equal to 3.2 % stroke rate/year from a score of 3  Above score calculated as 1 point each if  present [CHF, HTN, DM, Vascular=MI/PAD/Aortic Plaque, Age if 71-74, or Female] Above score calculated as 2 points each if present [Age > 75, or Stroke/TIA/TE]   2. Hypertension: Currently well controlled  3. Obesity: Weight loss encouraged  4. Chronic diastolic heart failure: No signs of volume overload  5. Obstructive sleep apnea: CPAP compliance encouraged  6.  Sinus node dysfunction: That is post [employer] house [employer].  Has a high bipolar impedance and thus has been switched to unipolar.  Device functioning appropriately unipolar without issues.  No changes.    [name] has presented today for surgery, with the diagnosis of atrial fibrillation.  The various methods of treatment have been discussed with the patient and family. After consideration of risks, benefits and other options for treatment, the patient has consented to  Procedure(s): Catheter ablation as a surgical intervention .  Risks include but not limited to bleeding, tamponade, heart block, stroke, damage to surrounding organs, among others. The patient's history has been reviewed, patient examined, no change in status, stable for surgery.  I have reviewed the patient's chart and labs.  Questions were answered to the patient's satisfaction.    Samantha Murphey Curt Bears, MD  03/13/2019 7:10 AM

## 2019-03-13 NOTE — Transfer of Care (Signed)
Immediate Anesthesia Transfer of Care Note  Patient: [name]  Procedure(s) Performed: ATRIAL FIBRILLATION ABLATION (N/A )  Patient Location: PACU and Cath Lab  Anesthesia Type:General  Level of Consciousness: awake, alert  and patient cooperative  Airway & Oxygen Therapy: Patient Spontanous Breathing and Patient connected to nasal cannula oxygen  Post-op Assessment: Report given to RN and Post -op Vital signs reviewed and stable  Post vital signs: Reviewed and stable  Last Vitals:  Vitals Value Taken Time  BP    Temp    Pulse 91 03/13/19 1010  Resp 21 03/13/19 1010  SpO2 94 % 03/13/19 1010  Vitals shown include unvalidated device data.  Last Pain:  Vitals:   03/13/19 0605  PainSc: 0-No pain      Patients Stated Pain Goal: 3 (41/28/20 8138)  Complications: No apparent anesthesia complications

## 2019-03-13 NOTE — Discharge Instructions (Signed)
Cardiac Ablation  Cardiac ablation is a procedure to stop some heart tissue from causing problems. The heart has many electrical connections. Sometimes these connections make the heart beat very fast or irregularly. Removing some problem areas can improve the heart rhythm or make it normal. What happens before the procedure?  Follow instructions from your doctor about what you cannot eat or drink.  Ask your doctor about: ? Changing or stopping your normal medicines. This is important if you take diabetes medicines or blood thinners. ? Taking medicines such as aspirin and ibuprofen. These medicines can thin your blood. Do not take these medicines before your procedure if your doctor tells you not to.  Plan to have someone take you home.  If you will be going home right after the procedure, plan to have someone with you for 24 hours. What happens during the procedure?  To lower your risk of infection: ? Your health care team will wash or sanitize their hands. ? Your skin will be washed with soap. ? Hair may be removed from your neck or groin.  An IV tube will be put into one of your veins.  You will be given a medicine to help you relax (sedative).  Skin on your neck or groin will be numbed.  A cut (incision) will be made in your neck or groin.  A needle will be put through your cut and into a vein in your neck or groin.  A tube (catheter) will be put into the needle. The tube will be moved to your heart. X-rays (fluoroscopy) will be used to help guide the tube.  Small devices (electrodes) on the tip of the tube will send out electrical currents.  Dye may be put through the tube. This helps your surgeon see your heart.  Electrical energy will be used to scar (ablate) some heart tissue. Your surgeon may use: ? Heat (radiofrequency energy). ? Laser energy. ? Extreme cold (cryoablation).  The tube will be taken out.  Pressure will be held on your cut. This helps stop  bleeding.  A bandage (dressing) will be put on your cut. The procedure may vary. What happens after the procedure?  You will be monitored until your medicines have worn off.  Your cut will be watched for bleeding. You will need to lie still for a few hours.  Do not drive for 24 hours or as long as your doctor tells you. Summary  Cardiac ablation is a procedure to stop some heart tissue from causing problems.  Electrical energy will be used to scar (ablate) some heart tissue. This information is not intended to replace advice given to you by your health care provider. Make sure you discuss any questions you have with your health care provider. Document Released: 02/19/2013 Document Revised: 06/01/2017 Document Reviewed: 05/08/2016 Elsevier Patient Education  2020 [place].              Femoral Site Care This sheet gives you information about how to care for yourself after your procedure. Your health care provider may also give you more specific instructions. If you have problems or questions, contact your health care provider. What can I expect after the procedure? After the procedure, it is common to have:  Bruising that usually fades within 1-2 weeks.  Tenderness at the site. Follow these instructions at home: Wound care  Follow instructions from your health care provider about how to take care of your insertion site. Make sure you: ? Wash your hands  with soap and water before you change your bandage (dressing). If soap and water are not available, use hand sanitizer. ? Change your dressing as told by your health care provider. ? Leave stitches (sutures), skin glue, or adhesive strips in place. These skin closures may need to stay in place for 2 weeks or longer. If adhesive strip edges start to loosen and curl up, you may trim the loose edges. Do not remove adhesive strips completely unless your health care provider tells you to do that.  Do not take baths, swim,  or use a hot tub until your health care provider approves.  You may shower 24-48 hours after the procedure or as told by your health care provider. ? Gently wash the site with plain soap and water. ? Pat the area dry with a clean towel. ? Do not rub the site. This may cause bleeding.  Do not apply powder or lotion to the site. Keep the site clean and dry.  Check your femoral site every day for signs of infection. Check for: ? Redness, swelling, or pain. ? Fluid or blood. ? Warmth. ? Pus or a bad smell. Activity  For the first 2-3 days after your procedure, or as long as directed: ? Avoid climbing stairs as much as possible. ? Do not squat.  Do not lift anything that is heavier than 10 lb (4.5 kg), or the limit that you are told, until your health care provider says that it is safe.  Rest as directed. ? Avoid sitting for a long time without moving. Get up to take short walks every 1-2 hours.  Do not drive for 24 hours if you were given a medicine to help you relax (sedative). General instructions  Take over-the-counter and prescription medicines only as told by your health care provider.  Keep all follow-up visits as told by your health care provider. This is important. Contact a health care provider if you have:  A fever or chills.  You have redness, swelling, or pain around your insertion site. Get help right away if:  The catheter insertion area swells very fast.  You pass out.  You suddenly start to sweat or your skin gets clammy.  The catheter insertion area is bleeding, and the bleeding does not stop when you hold steady pressure on the area.  The area near or just beyond the catheter insertion site becomes pale, cool, tingly, or numb. These symptoms may represent a serious problem that is an emergency. Do not wait to see if the symptoms will go away. Get medical help right away. Call your local emergency services (911 in the [place] Do not drive yourself to the  hospital. Summary  After the procedure, it is common to have bruising that usually fades within 1-2 weeks.  Check your femoral site every day for signs of infection.  Do not lift anything that is heavier than 10 lb (4.5 kg), or the limit that you are told, until your health care provider says that it is safe. This information is not intended to replace advice given to you by your health care provider. Make sure you discuss any questions you have with your health care provider. Document Released: 02/20/2014 Document Revised: 07/02/2017 Document Reviewed: 07/02/2017 Elsevier Patient Education  2020 [employer].

## 2019-03-13 NOTE — Anesthesia Postprocedure Evaluation (Signed)
Anesthesia Post Note  Patient: [name]  Procedure(s) Performed: ATRIAL FIBRILLATION ABLATION (N/A )     Patient location during evaluation: PACU Anesthesia Type: General Level of consciousness: awake and alert Pain management: pain level controlled Vital Signs Assessment: post-procedure vital signs reviewed and stable Respiratory status: spontaneous breathing, nonlabored ventilation and respiratory function stable Cardiovascular status: blood pressure returned to baseline and stable Postop Assessment: no apparent nausea or vomiting Anesthetic complications: no    Last Vitals:  Vitals:   03/13/19 1220 03/13/19 1250  BP: 136/68 132/65  Pulse: 97 98  Resp: 20 (!) 23  Temp:    SpO2: 93% 93%    Last Pain:  Vitals:   03/13/19 1130  TempSrc: Oral  PainSc: 0-No pain                 Lidia Collum

## 2019-03-13 NOTE — Progress Notes (Signed)
Ambulated to bathroom to void tol well  

## 2019-03-13 NOTE — Progress Notes (Signed)
Dr Curt Bears in to see pt states it's v pacing.

## 2019-03-13 NOTE — Progress Notes (Signed)
Site area: rt groin fv sheaths x2 Site Prior to Removal:  Level 0 Pressure Applied For: 20 minutes Manual:   yes Patient Status During Pull:   stable Post Pull Site:  Level  0 Post Pull Instructions Given:  yes Post Pull Pulses Present: rt dp palpable Dressing Applied:  Gauze and tegaderm Bedrest begins @  Comments:

## 2019-03-13 NOTE — Progress Notes (Signed)
Pt husband called  30 mins prior to discharge per his request. States he is on the way now.

## 2019-03-13 NOTE — Anesthesia Procedure Notes (Signed)
Procedure Name: Intubation Date/Time: 03/13/2019 7:51 AM Performed by: Elayne Snare, CRNA Pre-anesthesia Checklist: Patient identified, Emergency Drugs available, Suction available and Patient being monitored Patient Re-evaluated:Patient Re-evaluated prior to induction Oxygen Delivery Method: Circle System Utilized Preoxygenation: Pre-oxygenation with 100% oxygen Induction Type: IV induction and Rapid sequence Ventilation: Mask ventilation without difficulty Laryngoscope Size: Mac and 3 Grade View: Grade III Tube type: Oral Tube size: 7.5 mm Number of attempts: 1 Airway Equipment and Method: Stylet and Bougie stylet Placement Confirmation: ETT inserted through vocal cords under direct vision,  positive ETCO2 and breath sounds checked- equal and bilateral Secured at: 21 cm Tube secured with: Tape Dental Injury: Teeth and Oropharynx as per pre-operative assessment

## 2019-03-13 NOTE — Progress Notes (Signed)
V tach noted on monitor [employer], [place]  paged

## 2019-03-14 ENCOUNTER — Encounter (HOSPITAL_COMMUNITY): Payer: Self-pay | Admitting: Cardiology

## 2019-03-17 ENCOUNTER — Other Ambulatory Visit: Payer: Self-pay | Admitting: Internal Medicine

## 2019-03-17 ENCOUNTER — Telehealth: Payer: Self-pay | Admitting: Cardiology

## 2019-03-17 NOTE — Telephone Encounter (Signed)
HR is 115 in morning when she gets up, then she takes her Diltiazem and HR decreases into the 70/80s.  Educated that this is the response we want after she takes her Diltiazem. Pt advised to continue to monitor for now.  Discussed breakthrough AFib post ablation. Pt will continue to monitor and call if HRs greater than 100 after medication and/or other issues arise. Patient verbalized understanding and agreeable to plan.

## 2019-03-17 NOTE — Telephone Encounter (Signed)
New Message   Patient calling in asking to speak with [name]. Patient states that she just had an ablation and is having issues with her heart rate being elevated. This morning she states that her HR was 115 and oxygen was 93 after taking medication her HR went down to 73 and her oxygen went down to 91. Patient would like to know does her medications need to be altered to assist with elevation in heart rate.

## 2019-04-03 DIAGNOSIS — Z961 Presence of intraocular lens: Secondary | ICD-10-CM | POA: Diagnosis not present

## 2019-04-03 DIAGNOSIS — H35372 Puckering of macula, left eye: Secondary | ICD-10-CM | POA: Diagnosis not present

## 2019-04-03 DIAGNOSIS — H10413 Chronic giant papillary conjunctivitis, bilateral: Secondary | ICD-10-CM | POA: Diagnosis not present

## 2019-04-03 DIAGNOSIS — H43813 Vitreous degeneration, bilateral: Secondary | ICD-10-CM | POA: Diagnosis not present

## 2019-04-03 DIAGNOSIS — H02834 Dermatochalasis of left upper eyelid: Secondary | ICD-10-CM | POA: Diagnosis not present

## 2019-04-03 DIAGNOSIS — H04123 Dry eye syndrome of bilateral lacrimal glands: Secondary | ICD-10-CM | POA: Diagnosis not present

## 2019-04-03 DIAGNOSIS — H40053 Ocular hypertension, bilateral: Secondary | ICD-10-CM | POA: Diagnosis not present

## 2019-04-03 DIAGNOSIS — H02831 Dermatochalasis of right upper eyelid: Secondary | ICD-10-CM | POA: Diagnosis not present

## 2019-04-03 DIAGNOSIS — H11823 Conjunctivochalasis, bilateral: Secondary | ICD-10-CM | POA: Diagnosis not present

## 2019-04-08 ENCOUNTER — Encounter: Payer: Self-pay | Admitting: Adult Health

## 2019-04-08 ENCOUNTER — Other Ambulatory Visit: Payer: Self-pay

## 2019-04-08 ENCOUNTER — Telehealth: Payer: Self-pay | Admitting: Internal Medicine

## 2019-04-08 ENCOUNTER — Ambulatory Visit (INDEPENDENT_AMBULATORY_CARE_PROVIDER_SITE_OTHER): Payer: PPO | Admitting: Adult Health

## 2019-04-08 DIAGNOSIS — G4733 Obstructive sleep apnea (adult) (pediatric): Secondary | ICD-10-CM | POA: Diagnosis not present

## 2019-04-08 NOTE — Addendum Note (Signed)
Addended by: Len Blalock on: 04/08/2019 11:38 AM   Modules accepted: Orders

## 2019-04-08 NOTE — Telephone Encounter (Signed)
Patient has a telephone visit with TP today.  TP discussed with pt that we do not have a woman sleep doctor in the office, but pt is ok with returning to see CY (last seen by CY 12/06/2015).  CY please advise if ok for pt to return to your schedule- next OV is a 1 year rov for cpap.  Thanks!

## 2019-04-08 NOTE — Telephone Encounter (Signed)
Spoke with the pt  She is asking to switch providers to a female doc  Please advise if okay, thanks

## 2019-04-08 NOTE — Telephone Encounter (Signed)
I would be happy to see her again. Ok to use a held space if needed.

## 2019-04-08 NOTE — Patient Instructions (Addendum)
Continue on CPAP at bedtime Keep up the good work Work on [employer] Do not drive if sleepy Flu shot this week as planned  Follow-up with Dr. Annamaria Boots in 1 year and as needed

## 2019-04-08 NOTE — Telephone Encounter (Signed)
Spoke with pt. She would like to have a recall placed for her 1 year ROV. Nothing further is needed at this time.

## 2019-04-08 NOTE — Telephone Encounter (Signed)
Not seen since 2018. Ok to change to female doc - either Dr Loanne Drilling or Dr Carlis Abbott

## 2019-04-08 NOTE — Progress Notes (Signed)
Virtual Visit via Telephone Note  I connected with [name] on 04/08/19 at 11:30 AM EDT by telephone and verified that I am speaking with the correct person using two identifiers.  Location: Patient: Home  Provider: Office    I discussed the limitations, risks, security and privacy concerns of performing an evaluation and management service by telephone and the availability of in person appointments. I also discussed with the patient that there may be a patient responsible charge related to this service. The patient expressed understanding and agreed to proceed.   History of Present Illness: 73 year old female followed for obstructive sleep apnea  Medical history significant for coronary artery disease, CHF, A. fib with tachybradycardia syndrome status post pacemaker on Eliquis, chronic kidney disease, hypertension, hyperlipidemia, pulmonary hypertension   Today's tele-visit is a follow-up for sleep apnea.  She was last seen in 2018. Patient says she has been doing well on CPAP.  Says she never misses a night.  She wears it all night for at least 8 to 9 hours.  She feels rested with no significant daytime sleepiness.  States her mask is working well.  She does need order for new CPAP supplies. She is trying to lose weight but has not been as active with the COVID 19 virus  CPAP download shows excellent compliance with 100% usage.  She uses daily 9 hours.  She is on CPAP 13 cm H2O.  AHI 0.6.   Observations/Objective: NPSG 2010:  AHI 21/hr  Assessment and Plan: Obstructive sleep apnea with excellent control and compliance on CPAP Morbid obesity w/ BMI at 41   Plan  Patient Instructions  Continue on CPAP at bedtime Keep up the good work Work on [employer] Do not drive if sleepy Flu shot this week as planned  Follow-up with Dr. Annamaria Boots in 1 year and as needed     Follow Up Instructions: Follow up with Dr. Annamaria Boots  In 1 year and As needed      I discussed the assessment and  treatment plan with the patient. The patient was provided an opportunity to ask questions and all were answered. The patient agreed with the plan and demonstrated an understanding of the instructions.   The patient was advised to call back or seek an in-person evaluation if the symptoms worsen or if the condition fails to improve as anticipated.  I provided 22  minutes of non-face-to-face time during this encounter.   Rexene Edison, NP

## 2019-04-10 ENCOUNTER — Other Ambulatory Visit (INDEPENDENT_AMBULATORY_CARE_PROVIDER_SITE_OTHER): Payer: PPO

## 2019-04-10 ENCOUNTER — Ambulatory Visit (HOSPITAL_COMMUNITY)
Admission: RE | Admit: 2019-04-10 | Discharge: 2019-04-10 | Disposition: A | Discharge status: Home / Self Care | Payer: PPO | Source: Ambulatory Visit | Attending: Nurse Practitioner | Admitting: Nurse Practitioner

## 2019-04-10 ENCOUNTER — Other Ambulatory Visit: Payer: Self-pay

## 2019-04-10 ENCOUNTER — Other Ambulatory Visit (HOSPITAL_COMMUNITY): Payer: Self-pay | Admitting: Nurse Practitioner

## 2019-04-10 ENCOUNTER — Encounter (HOSPITAL_COMMUNITY): Payer: Self-pay | Admitting: Nurse Practitioner

## 2019-04-10 VITALS — BP 144/90 | HR 92 | Ht 66.5 in | Wt 270.4 lb

## 2019-04-10 DIAGNOSIS — I480 Paroxysmal atrial fibrillation: Secondary | ICD-10-CM | POA: Diagnosis not present

## 2019-04-10 DIAGNOSIS — I272 Pulmonary hypertension, unspecified: Secondary | ICD-10-CM | POA: Diagnosis not present

## 2019-04-10 DIAGNOSIS — Z23 Encounter for immunization: Secondary | ICD-10-CM

## 2019-04-10 DIAGNOSIS — Z8673 Personal history of transient ischemic attack (TIA), and cerebral infarction without residual deficits: Secondary | ICD-10-CM | POA: Diagnosis not present

## 2019-04-10 DIAGNOSIS — E785 Hyperlipidemia, unspecified: Secondary | ICD-10-CM | POA: Insufficient documentation

## 2019-04-10 DIAGNOSIS — I509 Heart failure, unspecified: Secondary | ICD-10-CM | POA: Insufficient documentation

## 2019-04-10 DIAGNOSIS — E669 Obesity, unspecified: Secondary | ICD-10-CM | POA: Diagnosis not present

## 2019-04-10 DIAGNOSIS — I252 Old myocardial infarction: Secondary | ICD-10-CM | POA: Insufficient documentation

## 2019-04-10 DIAGNOSIS — G4733 Obstructive sleep apnea (adult) (pediatric): Secondary | ICD-10-CM | POA: Diagnosis not present

## 2019-04-10 DIAGNOSIS — I2510 Atherosclerotic heart disease of native coronary artery without angina pectoris: Secondary | ICD-10-CM | POA: Insufficient documentation

## 2019-04-10 DIAGNOSIS — I130 Hypertensive heart and chronic kidney disease with heart failure and stage 1 through stage 4 chronic kidney disease, or unspecified chronic kidney disease: Secondary | ICD-10-CM | POA: Diagnosis not present

## 2019-04-10 DIAGNOSIS — Z6838 Body mass index (BMI) 38.0-38.9, adult: Secondary | ICD-10-CM | POA: Diagnosis not present

## 2019-04-10 DIAGNOSIS — R7301 Impaired fasting glucose: Secondary | ICD-10-CM

## 2019-04-10 DIAGNOSIS — I4819 Other persistent atrial fibrillation: Secondary | ICD-10-CM | POA: Diagnosis not present

## 2019-04-10 DIAGNOSIS — Z79899 Other long term (current) drug therapy: Secondary | ICD-10-CM

## 2019-04-10 DIAGNOSIS — Z7901 Long term (current) use of anticoagulants: Secondary | ICD-10-CM | POA: Diagnosis not present

## 2019-04-10 DIAGNOSIS — I10 Essential (primary) hypertension: Secondary | ICD-10-CM | POA: Diagnosis not present

## 2019-04-10 DIAGNOSIS — F329 Major depressive disorder, single episode, unspecified: Secondary | ICD-10-CM | POA: Insufficient documentation

## 2019-04-10 DIAGNOSIS — L304 Erythema intertrigo: Secondary | ICD-10-CM

## 2019-04-10 DIAGNOSIS — Z955 Presence of coronary angioplasty implant and graft: Secondary | ICD-10-CM | POA: Insufficient documentation

## 2019-04-10 DIAGNOSIS — Z950 Presence of cardiac pacemaker: Secondary | ICD-10-CM | POA: Diagnosis not present

## 2019-04-10 DIAGNOSIS — Z87891 Personal history of nicotine dependence: Secondary | ICD-10-CM | POA: Insufficient documentation

## 2019-04-10 DIAGNOSIS — K0859 Other unsatisfactory restoration of tooth: Secondary | ICD-10-CM

## 2019-04-10 DIAGNOSIS — N189 Chronic kidney disease, unspecified: Secondary | ICD-10-CM | POA: Insufficient documentation

## 2019-04-10 DIAGNOSIS — R7303 Prediabetes: Secondary | ICD-10-CM

## 2019-04-10 DIAGNOSIS — Z98818 Other dental procedure status: Secondary | ICD-10-CM

## 2019-04-10 DIAGNOSIS — F419 Anxiety disorder, unspecified: Secondary | ICD-10-CM | POA: Insufficient documentation

## 2019-04-10 DIAGNOSIS — M1990 Unspecified osteoarthritis, unspecified site: Secondary | ICD-10-CM | POA: Insufficient documentation

## 2019-04-10 DIAGNOSIS — I4892 Unspecified atrial flutter: Secondary | ICD-10-CM | POA: Insufficient documentation

## 2019-04-10 LAB — CBC WITH DIFFERENTIAL/PLATELET
Basophils Absolute: 0.1 10*3/uL (ref 0.0–0.1)
Basophils Relative: 0.9 % (ref 0.0–3.0)
Eosinophils Absolute: 0.3 10*3/uL (ref 0.0–0.7)
Eosinophils Relative: 2.9 % (ref 0.0–5.0)
HCT: 46.3 % — ABNORMAL HIGH (ref 36.0–46.0)
Hemoglobin: 15.4 g/dL — ABNORMAL HIGH (ref 12.0–15.0)
Lymphocytes Relative: 19.4 % (ref 12.0–46.0)
Lymphs Abs: 1.8 10*3/uL (ref 0.7–4.0)
MCHC: 33.2 g/dL (ref 30.0–36.0)
MCV: 98.8 fl (ref 78.0–100.0)
Monocytes Absolute: 0.9 10*3/uL (ref 0.1–1.0)
Monocytes Relative: 10 % (ref 3.0–12.0)
Neutro Abs: 6.2 10*3/uL (ref 1.4–7.7)
Neutrophils Relative %: 66.8 % (ref 43.0–77.0)
Platelets: 265 10*3/uL (ref 150.0–400.0)
RBC: 4.68 Mil/uL (ref 3.87–5.11)
RDW: 13.5 % (ref 11.5–15.5)
WBC: 9.3 10*3/uL (ref 4.0–10.5)

## 2019-04-10 LAB — LIPID PANEL
Cholesterol: 151 mg/dL (ref 0–200)
HDL: 50.8 mg/dL (ref >39.00)
LDL Cholesterol: 68 mg/dL (ref 0–99)
NonHDL: 99.71
Total CHOL/HDL Ratio: 3
Triglycerides: 160 mg/dL — ABNORMAL HIGH (ref 0.0–149.0)
VLDL: 32 mg/dL (ref 0.0–40.0)

## 2019-04-10 LAB — HEPATIC FUNCTION PANEL
ALT: 40 U/L — ABNORMAL HIGH (ref 0–35)
AST: 38 U/L — ABNORMAL HIGH (ref 0–37)
Albumin: 4.4 g/dL (ref 3.5–5.2)
Alkaline Phosphatase: 125 U/L — ABNORMAL HIGH (ref 39–117)
Bilirubin, Direct: 0.2 mg/dL (ref 0.0–0.3)
Total Bilirubin: 0.8 mg/dL (ref 0.2–1.2)
Total Protein: 7.3 g/dL (ref 6.0–8.3)

## 2019-04-10 LAB — BASIC METABOLIC PANEL
BUN: 19 mg/dL (ref 6–23)
CO2: 30 mEq/L (ref 19–32)
Calcium: 9.5 mg/dL (ref 8.4–10.5)
Chloride: 98 mEq/L (ref 96–112)
Creatinine, Ser: 1.07 mg/dL (ref 0.40–1.20)
GFR: 50.24 mL/min — ABNORMAL LOW (ref >60.00)
Glucose, Bld: 143 mg/dL — ABNORMAL HIGH (ref 70–99)
Potassium: 4.3 mEq/L (ref 3.5–5.1)
Sodium: 139 mEq/L (ref 135–145)

## 2019-04-10 LAB — TSH: TSH: 3.48 u[IU]/mL (ref 0.35–4.50)

## 2019-04-10 LAB — HEMOGLOBIN A1C: Hgb A1c MFr Bld: 6.9 % — ABNORMAL HIGH (ref 4.6–6.5)

## 2019-04-10 NOTE — Patient Instructions (Signed)
Cardioversion scheduled for Wednesday, October 14th  - Arrive at the [facility] and go to admitting at 9:30AM  -Do not eat or drink anything after midnight the night prior to your procedure.  - Take all your morning  medication with a sip of water prior to arrival.  - You will not be able to drive home after your procedure.

## 2019-04-10 NOTE — H&P (View-Only) (Signed)
Primary Care Physician: [name], MD Referring Physician: [name] is a 73 y.o. female with a h/o persistent afib/flutter that is in the afib clinic one month s/p 3rd ablation. She is in rate controlled afib. She feels she held in rhythm x 25 days and has been in persistent afib x  last 5 days. No swallowing or groin  Issues. Her husband has been sick and that may have been her stressor. No missed doses of anticoagulation. No swallowing or groin issues.   Today, she denies symptoms of palpitations, chest pain, shortness of breath, orthopnea, PND, lower extremity edema, dizziness, presyncope, syncope, or neurologic sequela. The patient is tolerating medications without difficulties and is otherwise without complaint today.   Past Medical History:  Diagnosis Date  . Anticoagulant long-term use    pradaxa  . Anxiety   . Arthritis    "fingers, lower back" (04/23/2017   . CAD (coronary artery disease) 7001,7494   post PTCA with bare-metal stenting to mid RCA in December 2004     . CHF (congestive heart failure) ([place])   . Chronic atrial fibrillation ([place]) 06/2007   Tachybradycardia pacemaker  . Chronic kidney disease    10% function - ?R, other kidney is compensating    . CVA (cerebral vascular accident) [facility]) [id number]   denies residual on 04/23/2017  . Depression   . Diplopia 06/19/2008   Qualifier: Diagnosis of  By: [name] MD, [name]   . Dysrhythmia    ATRIAL FIBRILATION  . Edema of lower extremity   . Hyperlipidemia   . Hypertension   . Inferior myocardial infarction [facility])    acute inferior wall mi/other medical hx  . Myocardial infarction ([place]) [id number]  . Obesity   . OSA on CPAP    last test- 2010  . Pacemaker   . Pneumonia 2014   tx. ----  [facility]  . Pulmonary hypertension ([place])    moderate pulmonary hypertension by 10/2016 echo and 10/2013 cardiac cath  . Shortness of breath   . Skin cancer    "cut off right Appelt; burned off LLE"  (04/23/2017)  . Sleep apnea   . Spondylolisthesis   . TIA (transient ischemic attack) 2008  . Unspecified hemorrhoids without mention of complication 3/84/6659   Colonoscopy--Dr. Carlean Purl    Past Surgical History:  Procedure Laterality Date  . ABDOMINAL HYSTERECTOMY    . APPENDECTOMY  1984  . ATRIAL FIBRILLATION ABLATION N/A 09/29/2016   Procedure: Atrial Fibrillation Ablation;  Surgeon: Will Meredith Leeds, MD;  Location: [place] CV LAB;  Service: Cardiovascular;  Laterality: N/A;  . ATRIAL FIBRILLATION ABLATION N/A 02/07/2018   Procedure: ATRIAL FIBRILLATION ABLATION;  Surgeon: Constance Haw, MD;  Location: [place] CV LAB;  Service: Cardiovascular;  Laterality: N/A;  . ATRIAL FIBRILLATION ABLATION N/A 03/13/2019   Procedure: ATRIAL FIBRILLATION ABLATION;  Surgeon: Constance Haw, MD;  Location: [place] CV LAB;  Service: Cardiovascular;  Laterality: N/A;  . BACK SURGERY    . CARDIOVERSION N/A 09/12/2017   Procedure: CARDIOVERSION;  Surgeon: Jerline Pain, MD;  Location[facility] ENDOSCOPY;  Service: Cardiovascular;  Laterality: N/A;  . CARDIOVERSION N/A 12/13/2017   Procedure: CARDIOVERSION;  Surgeon: Sanda Klein, MD;  Location: [place] ENDOSCOPY;  Service: Cardiovascular;  Laterality: N/A;  . CARDIOVERSION N/A 02/18/2018   Procedure: CARDIOVERSION;  Surgeon: Dorothy Spark, MD;  Location: [facility] ENDOSCOPY;  Service: Cardiovascular;  Laterality: N/A;  . CARDIOVERSION N/A 05/20/2018   Procedure:  CARDIOVERSION;  Surgeon: [name], MD;  Location: [facility] ENDOSCOPY;  Service: Cardiovascular;  Laterality: N/A;  . CATARACT EXTRACTION W/ INTRAOCULAR LENS  IMPLANT, BILATERAL Bilateral 01/15/2017- 03/2017  . CHOLECYSTECTOMY    . CORONARY ANGIOPLASTY  X 2  . CORONARY ANGIOPLASTY WITH STENT PLACEMENT  1998; ~ 2007; ?date   "1 stent; replaced stent; not sure when I got the last stent" (04/23/2017)  . DOPPLER ECHOCARDIOGRAPHY  2009  . ELECTROPHYSIOLOGIC STUDY N/A 03/31/2016    Procedure: Cardioversion;  Surgeon: Evans Lance, MD;  Location: [place] CV LAB;  Service: Cardiovascular;  Laterality: N/A;  . ELECTROPHYSIOLOGIC STUDY N/A 08/04/2016   Procedure: Cardioversion;  Surgeon: Evans Lance, MD;  Location: [place] CV LAB;  Service: Cardiovascular;  Laterality: N/A;  . INSERT / REPLACE / REMOVE PACEMAKER  06/2007  . IR RADIOLOGY PERIPHERAL GUIDED IV START  01/31/2018  . IR US GUIDE VASC ACCESS LEFT  01/31/2018  . JOINT REPLACEMENT    . LAPAROSCOPIC CHOLECYSTECTOMY  1994  . LEFT AND RIGHT HEART CATHETERIZATION WITH CORONARY ANGIOGRAM N/A 10/06/2013   Procedure: LEFT AND RIGHT HEART CATHETERIZATION WITH CORONARY ANGIOGRAM;  Surgeon: Troy Sine, MD;  Location: [facility] CATH LAB;  Service: Cardiovascular;  Laterality: N/A;  . LEFT OOPHORECTOMY Left ~ 1989  . POSTERIOR LUMBAR FUSION  2000s - 04/2015 X 3   L3-4; L4-5; L2-3; Dr Trenton Gammon  . RIGHT/LEFT HEART CATH AND CORONARY ANGIOGRAPHY N/A 05/09/2017   Procedure: RIGHT/LEFT HEART CATH AND CORONARY ANGIOGRAPHY;  Surgeon: Sherren Mocha, MD;  Location: [place] CV LAB;  Service: Cardiovascular;  Laterality: N/A;  . SKIN CANCER EXCISION Right    [facility]  . TEE WITHOUT CARDIOVERSION N/A 09/29/2016   Procedure: TRANSESOPHAGEAL ECHOCARDIOGRAM (TEE);  Surgeon: Jerline Pain, MD;  Location: [place];  Service: Cardiovascular;  Laterality: N/A;  . TOTAL ABDOMINAL HYSTERECTOMY  1984   "uterus & right ovary"  . TOTAL KNEE ARTHROPLASTY Left 04/23/2017  . TOTAL KNEE ARTHROPLASTY Left 04/23/2017   Procedure: TOTAL KNEE ARTHROPLASTY;  Surgeon: Frederik Pear, MD;  Location: [place];  Service: Orthopedics;  Laterality: Left;  . ULTRASOUND GUIDANCE FOR VASCULAR ACCESS  05/09/2017   Procedure: Ultrasound Guidance For Vascular Access;  Surgeon: Sherren Mocha, MD;  Location: [place] CV LAB;  Service: Cardiovascular;;    Current Outpatient Medications  Medication Sig Dispense Refill  . acetaminophen (TYLENOL) 500 MG tablet Take  1,000 mg by mouth 2 (two) times daily.     [name] albuterol (PROVENTIL HFA;VENTOLIN HFA) 108 (90 Base) MCG/ACT inhaler INHALE 2 PUFFS INTO THE LUNGS EVERY 4 (FOUR) HOURS AS NEEDED FOR WHEEZING OR SHORTNESS OF BREATH. 6.7 Inhaler 0  . apixaban (ELIQUIS) 5 MG TABS tablet Take 1 tablet (5 mg total) by mouth 2 (two) times daily. 180 tablet 2  . Ascorbic Acid (VITAMIN C) 1000 MG tablet Take 1,000 mg by mouth daily.    [name] atorvastatin (LIPITOR) 40 MG tablet TAKE 1 TABLET BY MOUTH EVERY DAY 90 tablet 3  . Calcium Carbonate-Vitamin D (CALCIUM 600+D PO) Take 1 tablet by mouth daily.    [name] diltiazem (CARDIZEM CD) 240 MG 24 hr capsule Take 1 capsule (240 mg total) by mouth daily. 90 capsule 2  . diphenhydramine-acetaminophen (TYLENOL PM) 25-500 MG TABS tablet Take 2 tablets by mouth at bedtime.     [name] FLUoxetine (PROZAC) 20 MG capsule TAKE 1 CAPSULE BY MOUTH EVERY DAY 90 capsule 0  . fluticasone (FLONASE) 50 MCG/ACT nasal spray Place 1 spray into both  nostrils at bedtime as needed for allergies.     [name] KLOR-CON M20 20 MEQ tablet TAKE 1 TABLET BY MOUTH TWICE A DAY (Patient taking differently: Take 20 mEq by mouth 2 (two) times daily. ) 180 tablet 3  . loratadine (CLARITIN) 10 MG tablet TAKE 1 TABLET BY MOUTH EVERY DAY 90 tablet 1  . Lysine 1000 MG TABS Take 1,000 mg by mouth at bedtime.     . Multiple Vitamin (MULTIVITAMIN WITH MINERALS) TABS tablet Take 1 tablet by mouth daily. Centrum    . nitroGLYCERIN (NITROSTAT) 0.4 MG SL tablet PLACE 1 TABLET (0.4 MG TOTAL) UNDER THE TONGUE EVERY 5 (FIVE) MINUTES X 3 DOSES AS NEEDED FOR CHEST PAIN. 75 tablet 2  . Polyethyl Glycol-Propyl Glycol (SYSTANE) 0.4-0.3 % SOLN Place 1 drop into both eyes at bedtime as needed (for dry eyes).     . torsemide (DEMADEX) 20 MG tablet TAKE 2 TABLETS (40 MG TOTAL) BY MOUTH 2 (TWO) TIMES DAILY. 360 tablet 1  . valACYclovir (VALTREX) 1000 MG tablet Take 1 tablet (1,000 mg total) by mouth every 12 (twelve) hours as needed (cold sores).    . vitamin  B-12 (CYANOCOBALAMIN) 500 MCG tablet Take 500 mcg by mouth at bedtime.    . vitamin E 400 UNIT capsule Take 800 Units by mouth at bedtime.      No current facility-administered medications for this encounter.     Allergies  Allergen Reactions  . Adhesive [Tape] Rash and Other (See Comments)    Allergic to EKG stickers and defibrillation pads.    Social History   Socioeconomic History  . Marital status: Married    Spouse name: Not on file  . Number of children: 0  . Years of education: HS  . Highest education level: Not on file  Occupational History  . Occupation: retired    Comment: previously worked [employer]  . Financial resource strain: Not hard at all  . Food insecurity    Worry: Never true    Inability: Never true  . Transportation needs    Medical: No    Non-medical: No  Tobacco Use  . Smoking status: Former Smoker    Packs/day: 1.00    Years: 5.00    Pack years: 5.00    Types: Cigarettes    Start date: 05/11/1978    Quit date: 07/03/1982    Years since quitting: 36.7  . Smokeless tobacco: Never Used  Substance and Sexual Activity  . Alcohol use: Yes    Comment: 04/23/2017 "once q 6 months; glass of wine"  . Drug use: No  . Sexual activity: Not Currently  Lifestyle  . Physical activity    Days per week: 0 days    Minutes per session: 0 min  . Stress: Rather much  Relationships  . Social connections    Talks on phone: More than three times a week    Gets together: More than three times a week    Attends religious service: More than 4 times per year    Active member of club or organization: Yes    Attends meetings of clubs or organizations: More than 4 times per year    Relationship status: Married  . Intimate partner violence    Fear of current or ex partner: No    Emotionally abused: No    Physically abused: No    Forced sexual activity: No  Other Topics Concern  . 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   Married    Originally from [place] of two, high school education   Former smoker [phone] 1ppd   Hunting dogs 7    Retired from [employer] 2     Family History  Problem Relation Age of Onset  . Suicidality Father        suicide death pt was 3 yrs, 5  . Arrhythmia Mother   . Hypertension Mother   . Diabetes Mother   . Dementia Mother   . Heart attack Brother   . Heart disease Paternal Aunt   . Prostate cancer Maternal Grandfather   . Diabetes Paternal Grandfather        fathers side of the family  . Colon cancer Maternal Aunt     ROS- All systems are reviewed and negative except as per the HPI above  Physical Exam: Vitals:   04/10/19 1002  BP: (!) 144/90  Pulse: 92  Weight: 122.7 kg  Height: 5' 6.5" (1.689 m)   Wt Readings from Last 3 Encounters:  04/10/19 122.7 kg  03/13/19 117.9 kg  02/04/19 120.2 kg    Labs: Lab Results  Component Value Date   NA 139 02/26/2019   K 4.6 02/26/2019   CL 96 02/26/2019   CO2 27 02/26/2019   GLUCOSE 101 (H) 02/26/2019   BUN 15 02/26/2019   CREATININE 1.06 (H) 02/26/2019   CALCIUM 9.8 02/26/2019   PHOS 4.0 06/01/2018   MG 2.3 06/01/2018   Lab Results  Component Value Date   INR 1.0 05/08/2017   Lab Results  Component Value Date   CHOL 101 06/01/2018   HDL 48 06/01/2018   LDLCALC 39 06/01/2018   TRIG 68 06/01/2018     GEN- The patient is well appearing, alert and oriented x 3 today.   Head- normocephalic, atraumatic Eyes-  Sclera clear, conjunctiva pink Ears- hearing intact Oropharynx- clear Neck- supple, no JVP Lymph- no cervical lymphadenopathy Lungs- Clear to ausculation bilaterally, normal work of breathing Heart- Regular rate and rhythm, no murmurs, rubs or gallops, PMI not laterally displaced GI- soft, NT, ND, + BS Extremities- no clubbing, cyanosis, or LLE edema, rt greater than left (chronic) MS- no significant deformity  or atrophy Skin- no rash or lesion Psych- euthymic mood, full affect Neuro- strength and sensation are intact  EKG-afib at 92 bpm, qrs int 88 ms, qtc 464 ms Epic records reviewed     Assessment and Plan: 1. Recurrent persistent afib S/p 3rd ablation Feels she may have returned to afib 5 days ago Will arrange for cardioversion No missed anticoagulation x 3 weeks  Continue diltiazem 240 mg daily  Cbc/bmet drawn this am at PCP's office, results will be seen in Epic   F/u in afib clinic in one week  Butch Penny C. Zuhair Lariccia, [facility] [address] [place], [place] [address] [phone]

## 2019-04-10 NOTE — Progress Notes (Signed)
Primary Care Physician: [name], MD Referring Physician: [name] is a 73 y.o. female with a h/o persistent afib/flutter that is in the afib clinic one month s/p 3rd ablation. She is in rate controlled afib. She feels she held in rhythm x 25 days and has been in persistent afib x  last 5 days. No swallowing or groin  Issues. Her husband has been sick and that may have been her stressor. No missed doses of anticoagulation. No swallowing or groin issues.   Today, she denies symptoms of palpitations, chest pain, shortness of breath, orthopnea, PND, lower extremity edema, dizziness, presyncope, syncope, or neurologic sequela. The patient is tolerating medications without difficulties and is otherwise without complaint today.   Past Medical History:  Diagnosis Date  . Anticoagulant long-term use    pradaxa  . Anxiety   . Arthritis    "fingers, lower back" (04/23/2017   . CAD (coronary artery disease) 0981,1914   post PTCA with bare-metal stenting to mid RCA in December 2004     . CHF (congestive heart failure) ([place])   . Chronic atrial fibrillation ([place]) 06/2007   Tachybradycardia pacemaker  . Chronic kidney disease    10% function - ?R, other kidney is compensating    . CVA (cerebral vascular accident) [facility]) [id number]   denies residual on 04/23/2017  . Depression   . Diplopia 06/19/2008   Qualifier: Diagnosis of  By: [name] MD, [name]   . Dysrhythmia    ATRIAL FIBRILATION  . Edema of lower extremity   . Hyperlipidemia   . Hypertension   . Inferior myocardial infarction [facility])    acute inferior wall mi/other medical hx  . Myocardial infarction ([place]) [id number]  . Obesity   . OSA on CPAP    last test- 2010  . Pacemaker   . Pneumonia 2014   tx. ----  [facility]  . Pulmonary hypertension ([place])    moderate pulmonary hypertension by 10/2016 echo and 10/2013 cardiac cath  . Shortness of breath   . Skin cancer    "cut off right Tome; burned off LLE"  (04/23/2017)  . Sleep apnea   . Spondylolisthesis   . TIA (transient ischemic attack) 2008  . Unspecified hemorrhoids without mention of complication 09/07/6576   Colonoscopy--Dr. Carlean Purl    Past Surgical History:  Procedure Laterality Date  . ABDOMINAL HYSTERECTOMY    . APPENDECTOMY  1984  . ATRIAL FIBRILLATION ABLATION N/A 09/29/2016   Procedure: Atrial Fibrillation Ablation;  Surgeon: Will Meredith Leeds, MD;  Location: [place] CV LAB;  Service: Cardiovascular;  Laterality: N/A;  . ATRIAL FIBRILLATION ABLATION N/A 02/07/2018   Procedure: ATRIAL FIBRILLATION ABLATION;  Surgeon: Constance Haw, MD;  Location: [place] CV LAB;  Service: Cardiovascular;  Laterality: N/A;  . ATRIAL FIBRILLATION ABLATION N/A 03/13/2019   Procedure: ATRIAL FIBRILLATION ABLATION;  Surgeon: Constance Haw, MD;  Location: [place] CV LAB;  Service: Cardiovascular;  Laterality: N/A;  . BACK SURGERY    . CARDIOVERSION N/A 09/12/2017   Procedure: CARDIOVERSION;  Surgeon: Jerline Pain, MD;  Location: [facility] ENDOSCOPY;  Service: Cardiovascular;  Laterality: N/A;  . CARDIOVERSION N/A 12/13/2017   Procedure: CARDIOVERSION;  Surgeon: Sanda Klein, MD;  Location: [place] ENDOSCOPY;  Service: Cardiovascular;  Laterality: N/A;  . CARDIOVERSION N/A 02/18/2018   Procedure: CARDIOVERSION;  Surgeon: Dorothy Spark, MD;  Location: [facility] ENDOSCOPY;  Service: Cardiovascular;  Laterality: N/A;  . CARDIOVERSION N/A 05/20/2018   Procedure:  CARDIOVERSION;  Surgeon: [name], MD;  Location: [facility] ENDOSCOPY;  Service: Cardiovascular;  Laterality: N/A;  . CATARACT EXTRACTION W/ INTRAOCULAR LENS  IMPLANT, BILATERAL Bilateral 01/15/2017- 03/2017  . CHOLECYSTECTOMY    . CORONARY ANGIOPLASTY  X 2  . CORONARY ANGIOPLASTY WITH STENT PLACEMENT  1998; ~ 2007; ?date   "1 stent; replaced stent; not sure when I got the last stent" (04/23/2017)  . DOPPLER ECHOCARDIOGRAPHY  2009  . ELECTROPHYSIOLOGIC STUDY N/A 03/31/2016    Procedure: Cardioversion;  Surgeon: Evans Lance, MD;  Location: [place] CV LAB;  Service: Cardiovascular;  Laterality: N/A;  . ELECTROPHYSIOLOGIC STUDY N/A 08/04/2016   Procedure: Cardioversion;  Surgeon: Evans Lance, MD;  Location: [place] CV LAB;  Service: Cardiovascular;  Laterality: N/A;  . INSERT / REPLACE / REMOVE PACEMAKER  06/2007  . IR RADIOLOGY PERIPHERAL GUIDED IV START  01/31/2018  . IR US GUIDE VASC ACCESS LEFT  01/31/2018  . JOINT REPLACEMENT    . LAPAROSCOPIC CHOLECYSTECTOMY  1994  . LEFT AND RIGHT HEART CATHETERIZATION WITH CORONARY ANGIOGRAM N/A 10/06/2013   Procedure: LEFT AND RIGHT HEART CATHETERIZATION WITH CORONARY ANGIOGRAM;  Surgeon: Troy Sine, MD;  Location: [facility] CATH LAB;  Service: Cardiovascular;  Laterality: N/A;  . LEFT OOPHORECTOMY Left ~ 1989  . POSTERIOR LUMBAR FUSION  2000s - 04/2015 X 3   L3-4; L4-5; L2-3; Dr Trenton Gammon  . RIGHT/LEFT HEART CATH AND CORONARY ANGIOGRAPHY N/A 05/09/2017   Procedure: RIGHT/LEFT HEART CATH AND CORONARY ANGIOGRAPHY;  Surgeon: Sherren Mocha, MD;  Location: [place] CV LAB;  Service: Cardiovascular;  Laterality: N/A;  . SKIN CANCER EXCISION Right    [place]  . TEE WITHOUT CARDIOVERSION N/A 09/29/2016   Procedure: TRANSESOPHAGEAL ECHOCARDIOGRAM (TEE);  Surgeon: Jerline Pain, MD;  Location: [facility];  Service: Cardiovascular;  Laterality: N/A;  . TOTAL ABDOMINAL HYSTERECTOMY  1984   "uterus & right ovary"  . TOTAL KNEE ARTHROPLASTY Left 04/23/2017  . TOTAL KNEE ARTHROPLASTY Left 04/23/2017   Procedure: TOTAL KNEE ARTHROPLASTY;  Surgeon: Frederik Pear, MD;  Location: [place];  Service: Orthopedics;  Laterality: Left;  . ULTRASOUND GUIDANCE FOR VASCULAR ACCESS  05/09/2017   Procedure: Ultrasound Guidance For Vascular Access;  Surgeon: Sherren Mocha, MD;  Location: [place] CV LAB;  Service: Cardiovascular;;    Current Outpatient Medications  Medication Sig Dispense Refill  . acetaminophen (TYLENOL) 500 MG tablet Take  1,000 mg by mouth 2 (two) times daily.     [name] albuterol (PROVENTIL HFA;VENTOLIN HFA) 108 (90 Base) MCG/ACT inhaler INHALE 2 PUFFS INTO THE LUNGS EVERY 4 (FOUR) HOURS AS NEEDED FOR WHEEZING OR SHORTNESS OF BREATH. 6.7 Inhaler 0  . apixaban (ELIQUIS) 5 MG TABS tablet Take 1 tablet (5 mg total) by mouth 2 (two) times daily. 180 tablet 2  . Ascorbic Acid (VITAMIN C) 1000 MG tablet Take 1,000 mg by mouth daily.    [name] atorvastatin (LIPITOR) 40 MG tablet TAKE 1 TABLET BY MOUTH EVERY DAY 90 tablet 3  . Calcium Carbonate-Vitamin D (CALCIUM 600+D PO) Take 1 tablet by mouth daily.    [name] diltiazem (CARDIZEM CD) 240 MG 24 hr capsule Take 1 capsule (240 mg total) by mouth daily. 90 capsule 2  . diphenhydramine-acetaminophen (TYLENOL PM) 25-500 MG TABS tablet Take 2 tablets by mouth at bedtime.     [name] FLUoxetine (PROZAC) 20 MG capsule TAKE 1 CAPSULE BY MOUTH EVERY DAY 90 capsule 0  . fluticasone (FLONASE) 50 MCG/ACT nasal spray Place 1 spray into both  nostrils at bedtime as needed for allergies.     [name] KLOR-CON M20 20 MEQ tablet TAKE 1 TABLET BY MOUTH TWICE A DAY (Patient taking differently: Take 20 mEq by mouth 2 (two) times daily. ) 180 tablet 3  . loratadine (CLARITIN) 10 MG tablet TAKE 1 TABLET BY MOUTH EVERY DAY 90 tablet 1  . Lysine 1000 MG TABS Take 1,000 mg by mouth at bedtime.     . Multiple Vitamin (MULTIVITAMIN WITH MINERALS) TABS tablet Take 1 tablet by mouth daily. Centrum    . nitroGLYCERIN (NITROSTAT) 0.4 MG SL tablet PLACE 1 TABLET (0.4 MG TOTAL) UNDER THE TONGUE EVERY 5 (FIVE) MINUTES X 3 DOSES AS NEEDED FOR CHEST PAIN. 75 tablet 2  . Polyethyl Glycol-Propyl Glycol (SYSTANE) 0.4-0.3 % SOLN Place 1 drop into both eyes at bedtime as needed (for dry eyes).     . torsemide (DEMADEX) 20 MG tablet TAKE 2 TABLETS (40 MG TOTAL) BY MOUTH 2 (TWO) TIMES DAILY. 360 tablet 1  . valACYclovir (VALTREX) 1000 MG tablet Take 1 tablet (1,000 mg total) by mouth every 12 (twelve) hours as needed (cold sores).    . vitamin  B-12 (CYANOCOBALAMIN) 500 MCG tablet Take 500 mcg by mouth at bedtime.    . vitamin E 400 UNIT capsule Take 800 Units by mouth at bedtime.      No current facility-administered medications for this encounter.     Allergies  Allergen Reactions  . Adhesive [Tape] Rash and Other (See Comments)    Allergic to EKG stickers and defibrillation pads.    Social History   Socioeconomic History  . Marital status: Married    Spouse name: Not on file  . Number of children: 0  . Years of education: HS  . Highest education level: Not on file  Occupational History  . Occupation: retired    Comment: previously worked [employer]  . Financial resource strain: Not hard at all  . Food insecurity    Worry: Never true    Inability: Never true  . Transportation needs    Medical: No    Non-medical: No  Tobacco Use  . Smoking status: Former Smoker    Packs/day: 1.00    Years: 5.00    Pack years: 5.00    Types: Cigarettes    Start date: 05/11/1978    Quit date: 07/03/1982    Years since quitting: 36.7  . Smokeless tobacco: Never Used  Substance and Sexual Activity  . Alcohol use: Yes    Comment: 04/23/2017 "once q 6 months; glass of wine"  . Drug use: No  . Sexual activity: Not Currently  Lifestyle  . Physical activity    Days per week: 0 days    Minutes per session: 0 min  . Stress: Rather much  Relationships  . Social connections    Talks on phone: More than three times a week    Gets together: More than three times a week    Attends religious service: More than 4 times per year    Active member of club or organization: Yes    Attends meetings of clubs or organizations: More than 4 times per year    Relationship status: Married  . Intimate partner violence    Fear of current or ex partner: No    Emotionally abused: No    Physically abused: No    Forced sexual activity: No  Other Topics Concern  . 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   Married    Originally from [place] of two, high school education   Former smoker [phone] 1ppd   Hunting dogs 7    Retired from [employer] 2     Family History  Problem Relation Age of Onset  . Suicidality Father        suicide death pt was 3 yrs, 10  . Arrhythmia Mother   . Hypertension Mother   . Diabetes Mother   . Dementia Mother   . Heart attack Brother   . Heart disease Paternal Aunt   . Prostate cancer Maternal Grandfather   . Diabetes Paternal Grandfather        fathers side of the family  . Colon cancer Maternal Aunt     ROS- All systems are reviewed and negative except as per the HPI above  Physical Exam: Vitals:   04/10/19 1002  BP: (!) 144/90  Pulse: 92  Weight: 122.7 kg  Height: 5' 6.5" (1.689 m)   Wt Readings from Last 3 Encounters:  04/10/19 122.7 kg  03/13/19 117.9 kg  02/04/19 120.2 kg    Labs: Lab Results  Component Value Date   NA 139 02/26/2019   K 4.6 02/26/2019   CL 96 02/26/2019   CO2 27 02/26/2019   GLUCOSE 101 (H) 02/26/2019   BUN 15 02/26/2019   CREATININE 1.06 (H) 02/26/2019   CALCIUM 9.8 02/26/2019   PHOS 4.0 06/01/2018   MG 2.3 06/01/2018   Lab Results  Component Value Date   INR 1.0 05/08/2017   Lab Results  Component Value Date   CHOL 101 06/01/2018   HDL 48 06/01/2018   LDLCALC 39 06/01/2018   TRIG 68 06/01/2018     GEN- The patient is well appearing, alert and oriented x 3 today.   Head- normocephalic, atraumatic Eyes-  Sclera clear, conjunctiva pink Ears- hearing intact Oropharynx- clear Neck- supple, no JVP Lymph- no cervical lymphadenopathy Lungs- Clear to ausculation bilaterally, normal work of breathing Heart- Regular rate and rhythm, no murmurs, rubs or gallops, PMI not laterally displaced GI- soft, NT, ND, + BS Extremities- no clubbing, cyanosis, or LLE edema, rt greater than left (chronic) MS- no significant deformity  or atrophy Skin- no rash or lesion Psych- euthymic mood, full affect Neuro- strength and sensation are intact  EKG-afib at 92 bpm, qrs int 88 ms, qtc 464 ms Epic records reviewed     Assessment and Plan: 1. Recurrent persistent afib S/p 3rd ablation Feels she may have returned to afib 5 days ago Will arrange for cardioversion No missed anticoagulation x 3 weeks  Continue diltiazem 240 mg daily  Cbc/bmet drawn this am at PCP's office, results will be seen in Epic   F/u in afib clinic in one week  Butch Penny C. Laporsche Hoeger, [facility] [address] [place], [place] [address] [phone]

## 2019-04-12 ENCOUNTER — Other Ambulatory Visit: Payer: Self-pay | Admitting: Internal Medicine

## 2019-04-12 ENCOUNTER — Other Ambulatory Visit (HOSPITAL_COMMUNITY)
Admission: RE | Admit: 2019-04-12 | Discharge: 2019-04-12 | Disposition: A | Discharge status: Home / Self Care | Payer: PPO | Source: Ambulatory Visit | Attending: Cardiology | Admitting: Cardiology

## 2019-04-12 DIAGNOSIS — Z01812 Encounter for preprocedural laboratory examination: Secondary | ICD-10-CM | POA: Insufficient documentation

## 2019-04-12 DIAGNOSIS — Z20828 Contact with and (suspected) exposure to other viral communicable diseases: Secondary | ICD-10-CM | POA: Diagnosis not present

## 2019-04-13 LAB — NOVEL CORONAVIRUS, NAA (HOSP ORDER, SEND-OUT TO REF LAB; TAT 18-24 HRS): SARS-CoV-2, NAA: NOT DETECTED

## 2019-04-14 ENCOUNTER — Other Ambulatory Visit: Payer: Self-pay | Admitting: Cardiovascular Disease

## 2019-04-15 DIAGNOSIS — Z1231 Encounter for screening mammogram for malignant neoplasm of breast: Secondary | ICD-10-CM | POA: Diagnosis not present

## 2019-04-15 LAB — HM MAMMOGRAPHY

## 2019-04-16 ENCOUNTER — Encounter (HOSPITAL_COMMUNITY)
Admission: RE | Disposition: A | Discharge status: Home / Self Care | Payer: Self-pay | Source: Home / Self Care | Attending: Cardiology

## 2019-04-16 ENCOUNTER — Other Ambulatory Visit: Payer: Self-pay

## 2019-04-16 ENCOUNTER — Ambulatory Visit (HOSPITAL_COMMUNITY): Payer: PPO | Admitting: Certified Registered Nurse Anesthetist

## 2019-04-16 ENCOUNTER — Ambulatory Visit (HOSPITAL_COMMUNITY)
Admission: RE | Admit: 2019-04-16 | Discharge: 2019-04-16 | Disposition: A | Discharge status: Home / Self Care | Payer: PPO | Attending: Cardiology | Admitting: Cardiology

## 2019-04-16 ENCOUNTER — Encounter (HOSPITAL_COMMUNITY): Payer: Self-pay

## 2019-04-16 DIAGNOSIS — I2510 Atherosclerotic heart disease of native coronary artery without angina pectoris: Secondary | ICD-10-CM | POA: Diagnosis not present

## 2019-04-16 DIAGNOSIS — I4819 Other persistent atrial fibrillation: Secondary | ICD-10-CM | POA: Diagnosis not present

## 2019-04-16 DIAGNOSIS — F419 Anxiety disorder, unspecified: Secondary | ICD-10-CM | POA: Insufficient documentation

## 2019-04-16 DIAGNOSIS — F329 Major depressive disorder, single episode, unspecified: Secondary | ICD-10-CM | POA: Insufficient documentation

## 2019-04-16 DIAGNOSIS — Z8673 Personal history of transient ischemic attack (TIA), and cerebral infarction without residual deficits: Secondary | ICD-10-CM | POA: Insufficient documentation

## 2019-04-16 DIAGNOSIS — I272 Pulmonary hypertension, unspecified: Secondary | ICD-10-CM | POA: Insufficient documentation

## 2019-04-16 DIAGNOSIS — I4892 Unspecified atrial flutter: Secondary | ICD-10-CM | POA: Diagnosis not present

## 2019-04-16 DIAGNOSIS — I5033 Acute on chronic diastolic (congestive) heart failure: Secondary | ICD-10-CM | POA: Diagnosis not present

## 2019-04-16 DIAGNOSIS — Z87891 Personal history of nicotine dependence: Secondary | ICD-10-CM | POA: Insufficient documentation

## 2019-04-16 DIAGNOSIS — I480 Paroxysmal atrial fibrillation: Secondary | ICD-10-CM | POA: Diagnosis not present

## 2019-04-16 DIAGNOSIS — I482 Chronic atrial fibrillation, unspecified: Secondary | ICD-10-CM | POA: Insufficient documentation

## 2019-04-16 DIAGNOSIS — Z7901 Long term (current) use of anticoagulants: Secondary | ICD-10-CM | POA: Diagnosis not present

## 2019-04-16 DIAGNOSIS — G4733 Obstructive sleep apnea (adult) (pediatric): Secondary | ICD-10-CM | POA: Diagnosis not present

## 2019-04-16 DIAGNOSIS — M1990 Unspecified osteoarthritis, unspecified site: Secondary | ICD-10-CM | POA: Insufficient documentation

## 2019-04-16 DIAGNOSIS — I252 Old myocardial infarction: Secondary | ICD-10-CM | POA: Insufficient documentation

## 2019-04-16 DIAGNOSIS — N189 Chronic kidney disease, unspecified: Secondary | ICD-10-CM | POA: Insufficient documentation

## 2019-04-16 DIAGNOSIS — E785 Hyperlipidemia, unspecified: Secondary | ICD-10-CM | POA: Insufficient documentation

## 2019-04-16 DIAGNOSIS — Z955 Presence of coronary angioplasty implant and graft: Secondary | ICD-10-CM | POA: Insufficient documentation

## 2019-04-16 DIAGNOSIS — I130 Hypertensive heart and chronic kidney disease with heart failure and stage 1 through stage 4 chronic kidney disease, or unspecified chronic kidney disease: Secondary | ICD-10-CM | POA: Diagnosis not present

## 2019-04-16 DIAGNOSIS — I5042 Chronic combined systolic (congestive) and diastolic (congestive) heart failure: Secondary | ICD-10-CM | POA: Diagnosis not present

## 2019-04-16 DIAGNOSIS — Z6841 Body Mass Index (BMI) 40.0 and over, adult: Secondary | ICD-10-CM | POA: Diagnosis not present

## 2019-04-16 DIAGNOSIS — E1122 Type 2 diabetes mellitus with diabetic chronic kidney disease: Secondary | ICD-10-CM | POA: Diagnosis not present

## 2019-04-16 DIAGNOSIS — I132 Hypertensive heart and chronic kidney disease with heart failure and with stage 5 chronic kidney disease, or end stage renal disease: Secondary | ICD-10-CM | POA: Diagnosis not present

## 2019-04-16 DIAGNOSIS — Z8249 Family history of ischemic heart disease and other diseases of the circulatory system: Secondary | ICD-10-CM | POA: Diagnosis not present

## 2019-04-16 DIAGNOSIS — Z79899 Other long term (current) drug therapy: Secondary | ICD-10-CM | POA: Insufficient documentation

## 2019-04-16 DIAGNOSIS — N186 End stage renal disease: Secondary | ICD-10-CM | POA: Diagnosis not present

## 2019-04-16 HISTORY — PX: CARDIOVERSION: SHX1299

## 2019-04-16 LAB — POCT I-STAT, CHEM 8
BUN: 22 mg/dL (ref 8–23)
Calcium, Ion: 1.01 mmol/L — ABNORMAL LOW (ref 1.15–1.40)
Chloride: 105 mmol/L (ref 98–111)
Creatinine, Ser: 1 mg/dL (ref 0.44–1.00)
Glucose, Bld: 133 mg/dL — ABNORMAL HIGH (ref 70–99)
HCT: 45 % (ref 36.0–46.0)
Hemoglobin: 15.3 g/dL — ABNORMAL HIGH (ref 12.0–15.0)
Potassium: 4.1 mmol/L (ref 3.5–5.1)
Sodium: 137 mmol/L (ref 135–145)
TCO2: 26 mmol/L (ref 22–32)

## 2019-04-16 SURGERY — CARDIOVERSION
Anesthesia: General

## 2019-04-16 MED ORDER — PROPOFOL 10 MG/ML IV BOLUS
INTRAVENOUS | Status: DC | PRN
  Administered 2019-04-16: 100 mg via INTRAVENOUS

## 2019-04-16 MED ORDER — LIDOCAINE 2% (20 MG/ML) 5 ML SYRINGE
INTRAMUSCULAR | Status: DC | PRN
  Administered 2019-04-16: 100 mg via INTRAVENOUS

## 2019-04-16 NOTE — Anesthesia Procedure Notes (Signed)
Procedure Name: General with mask airway Performed by: Valda Favia, CRNA Pre-anesthesia Checklist: Patient identified, Emergency Drugs available, Suction available, Patient being monitored and Timeout performed Patient Re-evaluated:Patient Re-evaluated prior to induction Oxygen Delivery Method: Ambu bag Induction Type: IV induction Ventilation: Mask ventilation without difficulty Placement Confirmation: positive ETCO2 Dental Injury: Teeth and Oropharynx as per pre-operative assessment

## 2019-04-16 NOTE — Discharge Instructions (Signed)

## 2019-04-16 NOTE — CV Procedure (Signed)
Procedure:   DCCV  Indication:  Symptomatic atrial fibrillation  Procedure Note:  The patient signed informed consent.  They have had had therapeutic anticoagulation with Eliquis greater than 3 weeks.  Anesthesia was administered by Dr. Sabra Heck.  Adequate airway was maintained throughout and vital followed per protocol.  They were cardioverted x 1 with 200J of biphasic synchronized energy.  They converted to NSR.  There were no apparent complications.  The patient had normal neuro status and respiratory status post procedure with vitals stable as recorded elsewhere.    Follow up:  They will continue on current medical therapy and follow up with cardiology as scheduled.  Oswaldo Milian, MD 04/16/2019 10:23 AM

## 2019-04-16 NOTE — Interval H&P Note (Signed)
History and Physical Interval Note:  04/16/2019 9:44 AM  [name]  has presented today for surgery, with the diagnosis of AFIB.  The various methods of treatment have been discussed with the patient and family. After consideration of risks, benefits and other options for treatment, the patient has consented to  Procedure(s): CARDIOVERSION (N/A) as a surgical intervention.  The patient's history has been reviewed, patient examined, no change in status, stable for surgery.  I have reviewed the patient's chart and labs.  Questions were answered to the patient's satisfaction.     Donato Heinz

## 2019-04-16 NOTE — Anesthesia Preprocedure Evaluation (Signed)
Anesthesia Evaluation  Patient identified by MRN, date of birth, ID band Patient awake    Reviewed: Allergy & Precautions, NPO status , Patient's Chart, lab work & pertinent test results  History of Anesthesia Complications Negative for: history of anesthetic complications  Airway Mallampati: I  TM Distance: >3 FB Neck ROM: Full    Dental no notable dental hx.    Pulmonary sleep apnea and Continuous Positive Airway Pressure Ventilation , former smoker,    Pulmonary exam normal breath sounds clear to auscultation       Cardiovascular hypertension, Pt. on medications + CAD, + Past MI, + Cardiac Stents and +CHF  Normal cardiovascular exam+ dysrhythmias Atrial Fibrillation + pacemaker  Rhythm:Regular Rate:Normal   '18 Cath - Single-vessel coronary artery disease with continued patency of the stented segment in the right coronary artery and chronic occlusion of a small acute marginal branch collateralized by the left coronary artery 2.  Patent left main, LAD, and left circumflex without significant stenosis 3.  Vigorous LV systolic function with mildly elevated LVEDP 4.  Severe pulmonary hypertension with mean pulmonary artery pressure of 48 mmHg.  Because of high cardiac output (8.5 L/min), the patient's pulmonary vascular resistance is only mildly increased at 3 Wood units.  '18 TTE - Moderate concentric LVH. EF 55% to 60%. Grade 2 diastolic dysfunction. Mild MR. Left atrium was moderately dilated. PA peak pressure: 53 mm Hg    Neuro/Psych PSYCHIATRIC DISORDERS Anxiety Depression  Diplopia  TIACVA, No Residual Symptoms    GI/Hepatic negative GI ROS, Neg liver ROS,   Endo/Other  diabetesMorbid obesity  Renal/GU CRFRenal disease     Musculoskeletal  (+) Arthritis ,   Abdominal   Peds  Hematology negative hematology ROS (+)   Anesthesia Other Findings   Reproductive/Obstetrics                              Anesthesia Physical  Anesthesia Plan  ASA: IV  Anesthesia Plan: General   Post-op Pain Management:    Induction: Intravenous  PONV Risk Score and Plan: 3 and Treatment may vary due to age or medical condition and Propofol infusion  Airway Management Planned: Mask and Natural Airway  Additional Equipment: None  Intra-op Plan:   Post-operative Plan:   Informed Consent: I have reviewed the patients History and Physical, chart, labs and discussed the procedure including the risks, benefits and alternatives for the proposed anesthesia with the patient or authorized representative who has indicated his/her understanding and acceptance.       Plan Discussed with: CRNA and Surgeon  Anesthesia Plan Comments:         Anesthesia Quick Evaluation

## 2019-04-16 NOTE — Transfer of Care (Signed)
Immediate Anesthesia Transfer of Care Note  Patient: [name]  Procedure(s) Performed: CARDIOVERSION (N/A )  Patient Location: Endoscopy Unit  Anesthesia Type:General  Level of Consciousness: awake, alert  and oriented  Airway & Oxygen Therapy: Patient Spontanous Breathing  Post-op Assessment: Report given to RN and Post -op Vital signs reviewed and stable  Post vital signs: Reviewed and stable  Last Vitals:  Vitals Value Taken Time  BP 127/69 04/16/19 1011  Temp    Pulse 82 04/16/19 1011  Resp 26 04/16/19 1011  SpO2 94 % 04/16/19 1011    Last Pain:  Vitals:   04/16/19 1011  TempSrc:   PainSc: 0-No pain         Complications: No apparent anesthesia complications

## 2019-04-16 NOTE — Anesthesia Postprocedure Evaluation (Signed)
Anesthesia Post Note  Patient: [name]  Procedure(s) Performed: CARDIOVERSION (N/A )     Patient location during evaluation: PACU Anesthesia Type: General Level of consciousness: awake and alert Pain management: pain level controlled Vital Signs Assessment: post-procedure vital signs reviewed and stable Respiratory status: spontaneous breathing, nonlabored ventilation and respiratory function stable Cardiovascular status: blood pressure returned to baseline and stable Postop Assessment: no apparent nausea or vomiting Anesthetic complications: no    Last Vitals:  Vitals:   04/16/19 1011 04/16/19 1026  BP: 127/69 128/71  Pulse: 82 80  Resp: (!) 26 11  SpO2: 94% 95%    Last Pain:  Vitals:   04/16/19 1026  PainSc: 0-No pain                 Lynda Rainwater

## 2019-04-17 ENCOUNTER — Encounter (HOSPITAL_COMMUNITY): Payer: Self-pay | Admitting: Cardiology

## 2019-04-23 ENCOUNTER — Ambulatory Visit (HOSPITAL_COMMUNITY)
Admission: RE | Admit: 2019-04-23 | Discharge: 2019-04-23 | Disposition: A | Discharge status: Home / Self Care | Payer: PPO | Source: Ambulatory Visit | Attending: Nurse Practitioner | Admitting: Nurse Practitioner

## 2019-04-23 ENCOUNTER — Other Ambulatory Visit: Payer: Self-pay

## 2019-04-23 ENCOUNTER — Encounter (HOSPITAL_COMMUNITY): Payer: Self-pay | Admitting: Nurse Practitioner

## 2019-04-23 VITALS — BP 126/60 | HR 89 | Ht 66.5 in | Wt 270.4 lb

## 2019-04-23 DIAGNOSIS — I4892 Unspecified atrial flutter: Secondary | ICD-10-CM | POA: Diagnosis not present

## 2019-04-23 DIAGNOSIS — M19049 Primary osteoarthritis, unspecified hand: Secondary | ICD-10-CM | POA: Insufficient documentation

## 2019-04-23 DIAGNOSIS — Z7901 Long term (current) use of anticoagulants: Secondary | ICD-10-CM | POA: Diagnosis not present

## 2019-04-23 DIAGNOSIS — I272 Pulmonary hypertension, unspecified: Secondary | ICD-10-CM | POA: Insufficient documentation

## 2019-04-23 DIAGNOSIS — Z96652 Presence of left artificial knee joint: Secondary | ICD-10-CM | POA: Insufficient documentation

## 2019-04-23 DIAGNOSIS — F419 Anxiety disorder, unspecified: Secondary | ICD-10-CM | POA: Insufficient documentation

## 2019-04-23 DIAGNOSIS — Z8249 Family history of ischemic heart disease and other diseases of the circulatory system: Secondary | ICD-10-CM | POA: Insufficient documentation

## 2019-04-23 DIAGNOSIS — I2510 Atherosclerotic heart disease of native coronary artery without angina pectoris: Secondary | ICD-10-CM | POA: Insufficient documentation

## 2019-04-23 DIAGNOSIS — Z6841 Body Mass Index (BMI) 40.0 and over, adult: Secondary | ICD-10-CM | POA: Insufficient documentation

## 2019-04-23 DIAGNOSIS — M47819 Spondylosis without myelopathy or radiculopathy, site unspecified: Secondary | ICD-10-CM | POA: Insufficient documentation

## 2019-04-23 DIAGNOSIS — G4733 Obstructive sleep apnea (adult) (pediatric): Secondary | ICD-10-CM | POA: Diagnosis not present

## 2019-04-23 DIAGNOSIS — Z9049 Acquired absence of other specified parts of digestive tract: Secondary | ICD-10-CM | POA: Insufficient documentation

## 2019-04-23 DIAGNOSIS — I252 Old myocardial infarction: Secondary | ICD-10-CM | POA: Diagnosis not present

## 2019-04-23 DIAGNOSIS — Z90711 Acquired absence of uterus with remaining cervical stump: Secondary | ICD-10-CM | POA: Diagnosis not present

## 2019-04-23 DIAGNOSIS — N189 Chronic kidney disease, unspecified: Secondary | ICD-10-CM | POA: Diagnosis not present

## 2019-04-23 DIAGNOSIS — Z85828 Personal history of other malignant neoplasm of skin: Secondary | ICD-10-CM | POA: Insufficient documentation

## 2019-04-23 DIAGNOSIS — I509 Heart failure, unspecified: Secondary | ICD-10-CM | POA: Insufficient documentation

## 2019-04-23 DIAGNOSIS — E669 Obesity, unspecified: Secondary | ICD-10-CM | POA: Diagnosis not present

## 2019-04-23 DIAGNOSIS — F329 Major depressive disorder, single episode, unspecified: Secondary | ICD-10-CM | POA: Diagnosis not present

## 2019-04-23 DIAGNOSIS — I4819 Other persistent atrial fibrillation: Secondary | ICD-10-CM | POA: Insufficient documentation

## 2019-04-23 DIAGNOSIS — Z8042 Family history of malignant neoplasm of prostate: Secondary | ICD-10-CM | POA: Insufficient documentation

## 2019-04-23 DIAGNOSIS — Z950 Presence of cardiac pacemaker: Secondary | ICD-10-CM | POA: Insufficient documentation

## 2019-04-23 DIAGNOSIS — E785 Hyperlipidemia, unspecified: Secondary | ICD-10-CM | POA: Insufficient documentation

## 2019-04-23 DIAGNOSIS — Z87891 Personal history of nicotine dependence: Secondary | ICD-10-CM | POA: Insufficient documentation

## 2019-04-23 DIAGNOSIS — Z818 Family history of other mental and behavioral disorders: Secondary | ICD-10-CM | POA: Insufficient documentation

## 2019-04-23 DIAGNOSIS — I130 Hypertensive heart and chronic kidney disease with heart failure and stage 1 through stage 4 chronic kidney disease, or unspecified chronic kidney disease: Secondary | ICD-10-CM | POA: Insufficient documentation

## 2019-04-23 DIAGNOSIS — Z8673 Personal history of transient ischemic attack (TIA), and cerebral infarction without residual deficits: Secondary | ICD-10-CM | POA: Diagnosis not present

## 2019-04-23 DIAGNOSIS — Z800 Family history of malignant neoplasm of digestive organs: Secondary | ICD-10-CM | POA: Insufficient documentation

## 2019-04-23 DIAGNOSIS — Z79899 Other long term (current) drug therapy: Secondary | ICD-10-CM | POA: Diagnosis not present

## 2019-04-23 DIAGNOSIS — R9431 Abnormal electrocardiogram [ECG] [EKG]: Secondary | ICD-10-CM | POA: Insufficient documentation

## 2019-04-23 DIAGNOSIS — Z888 Allergy status to other drugs, medicaments and biological substances status: Secondary | ICD-10-CM | POA: Insufficient documentation

## 2019-04-23 DIAGNOSIS — Z833 Family history of diabetes mellitus: Secondary | ICD-10-CM | POA: Insufficient documentation

## 2019-04-23 NOTE — Progress Notes (Signed)
Primary Care Physician: [name], MD Referring Physician: Dr. [name] is a 73 y.o. female with a h/o persistent afib/flutter that is in the afib clinic one month s/p 3rd ablation. She is in rate controlled afib. She feels she held in rhythm x 25 days and has been in persistent afib x  last 5 days. No swallowing or groin  Issues. Her husband has been sick and that may have been her stressor. No missed doses of anticoagulation.CHA2DS2VASc score is at least 5. No swallowing or groin issues.  F/u cardioversion 10/21, she is back in rhythm with PAC's. Despite being  back in rhythm she feels short of breath. Her weight is up 11 lbs since back in September. She has LLE.  Today, she denies symptoms of palpitations, chest pain, shortness of breath, orthopnea, PND, lower extremity edema, dizziness, presyncope, syncope, or neurologic sequela. The patient is tolerating medications without difficulties and is otherwise without complaint today.   Past Medical History:  Diagnosis Date  . Anticoagulant long-term use    pradaxa  . Anxiety   . Arthritis    "fingers, lower back" (04/23/2017   . CAD (coronary artery disease) 2703,5009   post PTCA with bare-metal stenting to mid RCA in December 2004     . CHF (congestive heart failure) ([place])   . Chronic atrial fibrillation ([place]) 06/2007   Tachybradycardia pacemaker  . Chronic kidney disease    10% function - ?R, other kidney is compensating    . CVA (cerebral vascular accident) [facility]) [id number]   denies residual on 04/23/2017  . Depression   . Diplopia 06/19/2008   Qualifier: Diagnosis of  By: [name] MD, [name]   . Dysrhythmia    ATRIAL FIBRILATION  . Edema of lower extremity   . Hyperlipidemia   . Hypertension   . Inferior myocardial infarction [facility])    acute inferior wall mi/other medical hx  . Myocardial infarction ([place]) [id number]  . Obesity   . OSA on CPAP    last test- 2010  . Pacemaker   . Pneumonia 2014   tx.  ----  [facility]  . Pulmonary hypertension ([place])    moderate pulmonary hypertension by 10/2016 echo and 10/2013 cardiac cath  . Shortness of breath   . Skin cancer    "cut off right [place]; burned off LLE" (04/23/2017)  . Sleep apnea   . Spondylolisthesis   . TIA (transient ischemic attack) 2008  . Unspecified hemorrhoids without mention of complication 01/15/9677   Colonoscopy--Dr. Carlean Purl    Past Surgical History:  Procedure Laterality Date  . ABDOMINAL HYSTERECTOMY    . APPENDECTOMY  1984  . ATRIAL FIBRILLATION ABLATION N/A 09/29/2016   Procedure: Atrial Fibrillation Ablation;  Surgeon: Will Meredith Leeds, MD;  Location: [place] CV LAB;  Service: Cardiovascular;  Laterality: N/A;  . ATRIAL FIBRILLATION ABLATION N/A 02/07/2018   Procedure: ATRIAL FIBRILLATION ABLATION;  Surgeon: Constance Haw, MD;  Location: [place] CV LAB;  Service: Cardiovascular;  Laterality: N/A;  . ATRIAL FIBRILLATION ABLATION N/A 03/13/2019   Procedure: ATRIAL FIBRILLATION ABLATION;  Surgeon: Constance Haw, MD;  Location: [place] CV LAB;  Service: Cardiovascular;  Laterality: N/A;  . BACK SURGERY    . CARDIOVERSION N/A 09/12/2017   Procedure: CARDIOVERSION;  Surgeon: Jerline Pain, MD;  Location: [facility] ENDOSCOPY;  Service: Cardiovascular;  Laterality: N/A;  . CARDIOVERSION N/A 12/13/2017   Procedure: CARDIOVERSION;  Surgeon: Sanda Klein, MD;  Location: [place];  Service: Cardiovascular;  Laterality: N/A;  . CARDIOVERSION N/A 02/18/2018   Procedure: CARDIOVERSION;  Surgeon: Dorothy Spark, MD;  Location: [facility] ENDOSCOPY;  Service: Cardiovascular;  Laterality: N/A;  . CARDIOVERSION N/A 05/20/2018   Procedure: CARDIOVERSION;  Surgeon: [name], MD;  Location: [facility] ENDOSCOPY;  Service: Cardiovascular;  Laterality: N/A;  . CARDIOVERSION N/A 04/16/2019   Procedure: CARDIOVERSION;  Surgeon: Donato Heinz, MD;  Location: [facility] ENDOSCOPY;  Service: Endoscopy;  Laterality: N/A;   . CATARACT EXTRACTION W/ INTRAOCULAR LENS  IMPLANT, BILATERAL Bilateral 01/15/2017- 03/2017  . CHOLECYSTECTOMY    . CORONARY ANGIOPLASTY  X 2  . CORONARY ANGIOPLASTY WITH STENT PLACEMENT  1998; ~ 2007; ?date   "1 stent; replaced stent; not sure when I got the last stent" (04/23/2017)  . DOPPLER ECHOCARDIOGRAPHY  2009  . ELECTROPHYSIOLOGIC STUDY N/A 03/31/2016   Procedure: Cardioversion;  Surgeon: Evans Lance, MD;  Location: [place] CV LAB;  Service: Cardiovascular;  Laterality: N/A;  . ELECTROPHYSIOLOGIC STUDY N/A 08/04/2016   Procedure: Cardioversion;  Surgeon: Evans Lance, MD;  Location: [place] CV LAB;  Service: Cardiovascular;  Laterality: N/A;  . INSERT / REPLACE / REMOVE PACEMAKER  06/2007  . IR RADIOLOGY PERIPHERAL GUIDED IV START  01/31/2018  . IR US GUIDE VASC ACCESS LEFT  01/31/2018  . JOINT REPLACEMENT    . LAPAROSCOPIC CHOLECYSTECTOMY  1994  . LEFT AND RIGHT HEART CATHETERIZATION WITH CORONARY ANGIOGRAM N/A 10/06/2013   Procedure: LEFT AND RIGHT HEART CATHETERIZATION WITH CORONARY ANGIOGRAM;  Surgeon: Troy Sine, MD;  Location: [facility] CATH LAB;  Service: Cardiovascular;  Laterality: N/A;  . LEFT OOPHORECTOMY Left ~ 1989  . POSTERIOR LUMBAR FUSION  2000s - 04/2015 X 3   L3-4; L4-5; L2-3; Dr Trenton Gammon  . RIGHT/LEFT HEART CATH AND CORONARY ANGIOGRAPHY N/A 05/09/2017   Procedure: RIGHT/LEFT HEART CATH AND CORONARY ANGIOGRAPHY;  Surgeon: Sherren Mocha, MD;  Location: [place] CV LAB;  Service: Cardiovascular;  Laterality: N/A;  . SKIN CANCER EXCISION Right    [place]  . TEE WITHOUT CARDIOVERSION N/A 09/29/2016   Procedure: TRANSESOPHAGEAL ECHOCARDIOGRAM (TEE);  Surgeon: Jerline Pain, MD;  Location: [place];  Service: Cardiovascular;  Laterality: N/A;  . TOTAL ABDOMINAL HYSTERECTOMY  1984   "uterus & right ovary"  . TOTAL KNEE ARTHROPLASTY Left 04/23/2017  . TOTAL KNEE ARTHROPLASTY Left 04/23/2017   Procedure: TOTAL KNEE ARTHROPLASTY;  Surgeon: Frederik Pear, MD;  Location:  [place];  Service: Orthopedics;  Laterality: Left;  . ULTRASOUND GUIDANCE FOR VASCULAR ACCESS  05/09/2017   Procedure: Ultrasound Guidance For Vascular Access;  Surgeon: Sherren Mocha, MD;  Location: [place] CV LAB;  Service: Cardiovascular;;    Current Outpatient Medications  Medication Sig Dispense Refill  . acetaminophen (TYLENOL) 500 MG tablet Take 1,000 mg by mouth daily.     [name] apixaban (ELIQUIS) 5 MG TABS tablet Take 1 tablet (5 mg total) by mouth 2 (two) times daily. 180 tablet 2  . Ascorbic Acid (VITAMIN C) 1000 MG tablet Take 1,000 mg by mouth at bedtime.     [name] atorvastatin (LIPITOR) 40 MG tablet TAKE 1 TABLET BY MOUTH EVERY DAY (Patient taking differently: Take 40 mg by mouth daily. ) 90 tablet 3  . Calcium Carbonate-Vitamin D (CALCIUM 600+D PO) Take 1 tablet by mouth at bedtime.     . carboxymethylcellulose (REFRESH TEARS) 0.5 % SOLN Place 1 drop into both eyes 3 (three) times daily as needed (dry eyes).    . Cyanocobalamin (B-12) 5000  MCG CAPS Take 5,000 mcg by mouth at bedtime.    [name] diltiazem (CARDIZEM CD) 240 MG 24 hr capsule Take 1 capsule (240 mg total) by mouth daily. 90 capsule 2  . diphenhydramine-acetaminophen (TYLENOL PM) 25-500 MG TABS tablet Take 2 tablets by mouth at bedtime.     [name] FLUoxetine (PROZAC) 20 MG capsule TAKE 1 CAPSULE BY MOUTH EVERY DAY (Patient taking differently: Take 20 mg by mouth daily. ) 90 capsule 0  . fluticasone (FLONASE) 50 MCG/ACT nasal spray Place 1 spray into both nostrils at bedtime as needed for allergies.     [name] loratadine (CLARITIN) 10 MG tablet TAKE 1 TABLET BY MOUTH EVERY DAY (Patient taking differently: Take 10 mg by mouth daily. ) 90 tablet 1  . Lysine 1000 MG TABS Take 1,000 mg by mouth at bedtime.     . Multiple Vitamin (MULTIVITAMIN WITH MINERALS) TABS tablet Take 1 tablet by mouth daily. Centrum    . nitroGLYCERIN (NITROSTAT) 0.4 MG SL tablet PLACE 1 TABLET (0.4 MG TOTAL) UNDER THE TONGUE EVERY 5 (FIVE) MINUTES X 3 DOSES AS NEEDED  FOR CHEST PAIN. 75 tablet 2  . omeprazole (PRILOSEC) 20 MG capsule Take 20 mg by mouth daily.    . potassium chloride SA (KLOR-CON M20) 20 MEQ tablet Take 1 tablet (20 mEq total) by mouth 2 (two) times daily. Please make overdue appt with Dr. Acie Fredrickson before anymore refills. 1st attempt 60 tablet 0  . torsemide (DEMADEX) 20 MG tablet TAKE 2 TABLETS (40 MG TOTAL) BY MOUTH 2 (TWO) TIMES DAILY. 360 tablet 1  . valACYclovir (VALTREX) 1000 MG tablet Take 1 tablet (1,000 mg total) by mouth every 12 (twelve) hours as needed (cold sores). (Patient taking differently: Take 2 g by mouth every 12 (twelve) hours as needed (cold sores). )    . vitamin E 1000 UNIT capsule Take 1,000 Units by mouth at bedtime.     No current facility-administered medications for this encounter.     Allergies  Allergen Reactions  . Adhesive [Tape] Rash and Other (See Comments)    Allergic to EKG stickers and defibrillation pads.    Social History   Socioeconomic History  . Marital status: Married    Spouse name: Not on file  . Number of children: 0  . Years of education: HS  . Highest education level: Not on file  Occupational History  . Occupation: retired    Comment: previously worked [employer]  . Financial resource strain: Not hard at all  . Food insecurity    Worry: Never true    Inability: Never true  . Transportation needs    Medical: No    Non-medical: No  Tobacco Use  . Smoking status: Former Smoker    Packs/day: 1.00    Years: 5.00    Pack years: 5.00    Types: Cigarettes    Start date: 05/11/1978    Quit date: 07/03/1982    Years since quitting: 36.8  . Smokeless tobacco: Never Used  Substance and Sexual Activity  . Alcohol use: Yes    Comment: 04/23/2017 "once q 6 months; glass of wine"  . Drug use: No  . Sexual activity: Not Currently  Lifestyle  . Physical activity    Days per week: 0 days    Minutes per session: 0 min  . Stress: Rather much  Relationships  . Social  connections    Talks on phone: More than three times a week  Gets together: More than three times a week    Attends religious service: More than 4 times per year    Active member of club or organization: Yes    Attends meetings of clubs or organizations: More than 4 times per year    Relationship status: Married  . Intimate partner violence    Fear of current or ex partner: No    Emotionally abused: No    Physically abused: No    Forced sexual activity: No  Other Topics Concern  . 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   Married    Originally from [place] of two, high school education   Former smoker [phone] 1ppd   Hunting dogs 7    Retired from [employer] 2     Family History  Problem Relation Age of Onset  . Suicidality Father        suicide death pt was 3 yrs, 27  . Arrhythmia Mother   . Hypertension Mother   . Diabetes Mother   . Dementia Mother   . Heart attack Brother   . Heart disease Paternal Aunt   . Prostate cancer Maternal Grandfather   . Diabetes Paternal Grandfather        fathers side of the family  . Colon cancer Maternal Aunt     ROS- All systems are reviewed and negative except as per the HPI above  Physical Exam: Vitals:   04/23/19 1035  BP: 126/60  Pulse: 89  Weight: 122.7 kg  Height: 5' 6.5" (1.689 m)   Wt Readings from Last 3 Encounters:  04/23/19 122.7 kg  04/10/19 122.7 kg  03/13/19 117.9 kg    Labs: Lab Results  Component Value Date   NA 137 04/16/2019   K 4.1 04/16/2019   CL 105 04/16/2019   CO2 30 04/10/2019   GLUCOSE 133 (H) 04/16/2019   BUN 22 04/16/2019   CREATININE 1.00 04/16/2019   CALCIUM 9.5 04/10/2019   PHOS 4.0 06/01/2018   MG 2.3 06/01/2018   Lab Results  Component Value Date   INR 1.0 05/08/2017   Lab Results  Component Value Date   CHOL 151 04/10/2019   HDL 50.80 04/10/2019   LDLCALC 68 04/10/2019    TRIG 160.0 (H) 04/10/2019     GEN- The patient is well appearing, alert and oriented x 3 today.   Head- normocephalic, atraumatic Eyes-  Sclera clear, conjunctiva pink Ears- hearing intact Oropharynx- clear Neck- supple, no JVP Lymph- no cervical lymphadenopathy Lungs- Clear to ausculation bilaterally, normal work of breathing Heart- Regular rate and rhythm, no murmurs, rubs or gallops, PMI not laterally displaced GI- soft, NT, ND, + BS Extremities- no clubbing, cyanosis, or LLE edema, rt greater than left (chronic) MS- no significant deformity or atrophy Skin- no rash or lesion Psych- euthymic mood, full affect Neuro- strength and sensation are intact  EKG- SR at 86 bpm, PR int 236 ms, qrs int 84 ms, qtc 445 ms Epic records reviewed     Assessment and Plan: 1. Recurrent persistent afib S/p 3rd ablation S/p successful cardioversion and is in SR with PAC's  Continue anticoagulation, eliquis 5 mg bid,  without missed doses with a CHA2DS2VASc score of at least 5 Continue diltiazem 240 mg daily   2. CHF Weight is up 11 lbs since  last  episode of afib Will increase torsemide to  60 mg bid for the next 3 days, then return to her normal dose of 40 mg bid Avoid salt Watch po liquid intake   F/u in afib clinic on Monday for nurse visit with  EKG, weight, BP, bmet   Butch Penny C. Doaa Kendzierski, [facility] [address] [place], [place] [address] [phone]

## 2019-04-23 NOTE — Patient Instructions (Addendum)
Increase Torsemide 60mg  twice a day for three days then decrease to original dose

## 2019-04-28 ENCOUNTER — Ambulatory Visit (HOSPITAL_COMMUNITY)
Admission: RE | Admit: 2019-04-28 | Discharge: 2019-04-28 | Disposition: A | Discharge status: Home / Self Care | Payer: PPO | Source: Ambulatory Visit | Attending: Nurse Practitioner | Admitting: Nurse Practitioner

## 2019-04-28 ENCOUNTER — Other Ambulatory Visit: Payer: Self-pay

## 2019-04-28 DIAGNOSIS — I4819 Other persistent atrial fibrillation: Secondary | ICD-10-CM | POA: Diagnosis not present

## 2019-04-28 LAB — BASIC METABOLIC PANEL
Anion gap: 14 (ref 5–15)
BUN: 24 mg/dL — ABNORMAL HIGH (ref 8–23)
CO2: 22 mmol/L (ref 22–32)
Calcium: 9.2 mg/dL (ref 8.9–10.3)
Chloride: 102 mmol/L (ref 98–111)
Creatinine, Ser: 1.07 mg/dL — ABNORMAL HIGH (ref 0.44–1.00)
GFR calc Af Amer: 60 mL/min — ABNORMAL LOW (ref >60)
GFR calc non Af Amer: 51 mL/min — ABNORMAL LOW (ref >60)
Glucose, Bld: 107 mg/dL — ABNORMAL HIGH (ref 70–99)
Potassium: 4.4 mmol/L (ref 3.5–5.1)
Sodium: 138 mmol/L (ref 135–145)

## 2019-05-12 ENCOUNTER — Other Ambulatory Visit: Payer: Self-pay | Admitting: Cardiovascular Disease

## 2019-05-14 ENCOUNTER — Encounter: Payer: Self-pay | Admitting: Internal Medicine

## 2019-05-15 ENCOUNTER — Other Ambulatory Visit: Payer: Self-pay | Admitting: *Deleted

## 2019-05-16 ENCOUNTER — Telehealth: Payer: Self-pay | Admitting: Cardiovascular Disease

## 2019-05-16 MED ORDER — POTASSIUM CHLORIDE CRYS ER 20 MEQ PO TBCR: 20.0000 meq | EXTENDED_RELEASE_TABLET | Freq: Two times a day (BID) | ORAL | 0 refills | Status: DC

## 2019-05-16 NOTE — Telephone Encounter (Signed)
Pt's medication was sent to pt's pharmacy as requested. Confirmation received.  °

## 2019-05-16 NOTE — Telephone Encounter (Signed)
°*  STAT* If patient is at the pharmacy, call can be transferred to refill team.   1. Which medications need to be refilled? (please list name of each medication and dose if known) potassium chloride SA (KLOR-CON M20) 20 MEQ tablet  2. Which pharmacy/location (including street and city if local pharmacy) is medication to be sent to? [id number] - [place], [place]  3. Do they need a 30 day or 90 day supply? [place]

## 2019-05-23 ENCOUNTER — Other Ambulatory Visit: Payer: Self-pay | Admitting: Cardiovascular Disease

## 2019-06-17 ENCOUNTER — Encounter: Payer: Self-pay | Admitting: Cardiology

## 2019-06-17 ENCOUNTER — Other Ambulatory Visit: Payer: Self-pay

## 2019-06-17 ENCOUNTER — Ambulatory Visit: Payer: PPO | Admitting: Cardiology

## 2019-06-17 VITALS — BP 116/72 | HR 88 | Ht 66.5 in | Wt 269.0 lb

## 2019-06-17 DIAGNOSIS — I4819 Other persistent atrial fibrillation: Secondary | ICD-10-CM | POA: Diagnosis not present

## 2019-06-17 MED ORDER — SOTALOL HCL 80 MG PO TABS: 40.0000 mg | ORAL_TABLET | Freq: Two times a day (BID) | ORAL | 1 refills | Status: DC

## 2019-06-17 NOTE — Progress Notes (Signed)
Electrophysiology Office Note   Date:  06/17/2019   ID:  Lesslie, Mossa 1946/01/07, MRN [id number]  PCP:  [name], MD  Primary Electrophysiologist: Gaye Alken, MD    No chief complaint on file.    History of Present Illness: [name] is a 73 y.o. female who presents today for electrophysiology evaluation.   She has a history of coronary artery disease status post stent to the RCA in 2004, CHF, atrial fibrillation with tachybradycardia syndrome status post pacemaker, CKD, hypertension, hyperlipidemia, inferior wall MI, sleep apnea on CPAP, stroke. She is maintaining sinus rhythm on CPAP and dofetilide until she got an infection and when in atrial fibrillation. Had ablation on 09/29/16.  Unfortunately, over the last few months, she has required 2 cardioversions.  She had repeat ablation 02/07/2018.  Since that time, she has had both recurrent atrial fibrillation and atypical atrial flutter.  Today, denies symptoms of palpitations, chest pain, shortness of breath, orthopnea, PND, lower extremity edema, claudication, dizziness, presyncope, syncope, bleeding, or neurologic sequela. The patient is tolerating medications without difficulties.  She returns to clinic today feeling mildly short of breath.  She went into atrial flutter yesterday.  She felt well prior to that without any major complaints.   Past Medical History:  Diagnosis Date  . Anticoagulant long-term use    pradaxa  . Anxiety   . Arthritis    "fingers, lower back" (04/23/2017   . CAD (coronary artery disease) [id number]   post PTCA with bare-metal stenting to mid RCA in December 2004     . CHF (congestive heart failure) ([place])   . Chronic atrial fibrillation ([place]) 06/2007   Tachybradycardia pacemaker  . Chronic kidney disease    10% function - ?R, other kidney is compensating    . CVA (cerebral vascular accident) [facility]) [phone]   denies residual on 04/23/2017  . Depression   . Diplopia  06/19/2008   Qualifier: Diagnosis of  By: [name] MD, [name]   . Dysrhythmia    ATRIAL FIBRILATION  . Edema of lower extremity   . Hyperlipidemia   . Hypertension   . Inferior myocardial infarction [facility])    acute inferior wall mi/other medical hx  . Myocardial infarction ([place]) [id number]  . Obesity   . OSA on CPAP    last test- 2010  . Pacemaker   . Pneumonia 2014   tx. ----  [facility] ([place])  . Pulmonary hypertension ([place])    moderate pulmonary hypertension by 10/2016 echo and 10/2013 cardiac cath  . Shortness of breath   . Skin cancer    "cut off right [place]; burned off LLE" (04/23/2017)  . Sleep apnea   . Spondylolisthesis   . TIA (transient ischemic attack) 2008  . Unspecified hemorrhoids without mention of complication 7/56/4332   Colonoscopy--Dr. Carlean Purl    Past Surgical History:  Procedure Laterality Date  . ABDOMINAL HYSTERECTOMY    . APPENDECTOMY  1984  . ATRIAL FIBRILLATION ABLATION N/A 09/29/2016   Procedure: Atrial Fibrillation Ablation;  Surgeon: Cherae Marton Meredith Leeds, MD;  Location: [place] CV LAB;  Service: Cardiovascular;  Laterality: N/A;  . ATRIAL FIBRILLATION ABLATION N/A 02/07/2018   Procedure: ATRIAL FIBRILLATION ABLATION;  Surgeon: Constance Haw, MD;  Location: [place] CV LAB;  Service: Cardiovascular;  Laterality: N/A;  . ATRIAL FIBRILLATION ABLATION N/A 03/13/2019   Procedure: ATRIAL FIBRILLATION ABLATION;  Surgeon: Constance Haw, MD;  Location: [place] CV LAB;  Service: Cardiovascular;  Laterality:  N/A;  . BACK SURGERY    . CARDIOVERSION N/A 09/12/2017   Procedure: CARDIOVERSION;  Surgeon: Jerline Pain, MD;  Location: [facility] ENDOSCOPY;  Service: Cardiovascular;  Laterality: N/A;  . CARDIOVERSION N/A 12/13/2017   Procedure: CARDIOVERSION;  Surgeon: Sanda Klein, MD;  Location: [place] ENDOSCOPY;  Service: Cardiovascular;  Laterality: N/A;  . CARDIOVERSION N/A 02/18/2018   Procedure: CARDIOVERSION;  Surgeon: Dorothy Spark, MD;   Location: [facility] ENDOSCOPY;  Service: Cardiovascular;  Laterality: N/A;  . CARDIOVERSION N/A 05/20/2018   Procedure: CARDIOVERSION;  Surgeon: [name], MD;  Location: [facility] ENDOSCOPY;  Service: Cardiovascular;  Laterality: N/A;  . CARDIOVERSION N/A 04/16/2019   Procedure: CARDIOVERSION;  Surgeon: Donato Heinz, MD;  Location: [place];  Service: Endoscopy;  Laterality: N/A;  . CATARACT EXTRACTION W/ INTRAOCULAR LENS  IMPLANT, BILATERAL Bilateral 01/15/2017- 03/2017  . CHOLECYSTECTOMY    . CORONARY ANGIOPLASTY  X 2  . CORONARY ANGIOPLASTY WITH STENT PLACEMENT  1998; ~ 2007; ?date   "1 stent; replaced stent; not sure when I got the last stent" (04/23/2017)  . DOPPLER ECHOCARDIOGRAPHY  2009  . ELECTROPHYSIOLOGIC STUDY N/A 03/31/2016   Procedure: Cardioversion;  Surgeon: Evans Lance, MD;  Location: [place] CV LAB;  Service: Cardiovascular;  Laterality: N/A;  . ELECTROPHYSIOLOGIC STUDY N/A 08/04/2016   Procedure: Cardioversion;  Surgeon: Evans Lance, MD;  Location: [place] CV LAB;  Service: Cardiovascular;  Laterality: N/A;  . INSERT / REPLACE / REMOVE PACEMAKER  06/2007  . IR RADIOLOGY PERIPHERAL GUIDED IV START  01/31/2018  . IR US GUIDE VASC ACCESS LEFT  01/31/2018  . JOINT REPLACEMENT    . LAPAROSCOPIC CHOLECYSTECTOMY  1994  . LEFT AND RIGHT HEART CATHETERIZATION WITH CORONARY ANGIOGRAM N/A 10/06/2013   Procedure: LEFT AND RIGHT HEART CATHETERIZATION WITH CORONARY ANGIOGRAM;  Surgeon: Troy Sine, MD;  [facility] C[facility] LAB;  Service: Cardiovascular;  Laterality: N/A;  . LEFT OOPHORECTOMY Left ~ 1989  . POSTERIOR LUMBAR FUSION  2000s - 04/2015 X 3   L3-4; L4-5; L2-3; Dr Trenton Gammon  . RIGHT/LEFT HEART CATH AND CORONARY ANGIOGRAPHY N/A 05/09/2017   Procedure: RIGHT/LEFT HEART CATH AND CORONARY ANGIOGRAPHY;  Surgeon: Sherren Mocha, MD;  Location: [place] CV LAB;  Service: Cardiovascular;  Laterality: N/A;  . SKIN CANCER EXCISION Right    [place]  . TEE WITHOUT  CARDIOVERSION N/A 09/29/2016   Procedure: TRANSESOPHAGEAL ECHOCARDIOGRAM (TEE);  Surgeon: Jerline Pain, MD;  Location: [place];  Service: Cardiovascular;  Laterality: N/A;  . TOTAL ABDOMINAL HYSTERECTOMY  1984   "uterus & right ovary"  . TOTAL KNEE ARTHROPLASTY Left 04/23/2017  . TOTAL KNEE ARTHROPLASTY Left 04/23/2017   Procedure: TOTAL KNEE ARTHROPLASTY;  Surgeon: Frederik Pear, MD;  Location: [place];  Service: Orthopedics;  Laterality: Left;  . ULTRASOUND GUIDANCE FOR VASCULAR ACCESS  05/09/2017   Procedure: Ultrasound Guidance For Vascular Access;  Surgeon: Sherren Mocha, MD;  Location: [place] CV LAB;  Service: Cardiovascular;;     Current Outpatient Medications  Medication Sig Dispense Refill  . acetaminophen (TYLENOL) 500 MG tablet Take 1,000 mg by mouth daily.     [name] apixaban (ELIQUIS) 5 MG TABS tablet Take 1 tablet (5 mg total) by mouth 2 (two) times daily. 180 tablet 2  . Ascorbic Acid (VITAMIN C) 1000 MG tablet Take 1,000 mg by mouth at bedtime.     [name] atorvastatin (LIPITOR) 40 MG tablet TAKE 1 TABLET BY MOUTH EVERY DAY (Patient taking differently: Take 40 mg by mouth  daily. ) 90 tablet 3  . Calcium Carbonate-Vitamin D (CALCIUM 600+D PO) Take 1 tablet by mouth at bedtime.     . carboxymethylcellulose (REFRESH TEARS) 0.5 % SOLN Place 1 drop into both eyes 3 (three) times daily as needed (dry eyes).    . Cyanocobalamin (B-12) 5000 MCG CAPS Take 5,000 mcg by mouth at bedtime.    [name] diltiazem (CARDIZEM CD) 240 MG 24 hr capsule Take 1 capsule (240 mg total) by mouth daily. 90 capsule 2  . diphenhydramine-acetaminophen (TYLENOL PM) 25-500 MG TABS tablet Take 2 tablets by mouth at bedtime.     [name] FLUoxetine (PROZAC) 20 MG capsule TAKE 1 CAPSULE BY MOUTH EVERY DAY (Patient taking differently: Take 20 mg by mouth daily. ) 90 capsule 0  . fluticasone (FLONASE) 50 MCG/ACT nasal spray Place 1 spray into both nostrils at bedtime as needed for allergies.     [name] loratadine (CLARITIN) 10 MG  tablet TAKE 1 TABLET BY MOUTH EVERY DAY (Patient taking differently: Take 10 mg by mouth daily. ) 90 tablet 1  . Lysine 1000 MG TABS Take 1,000 mg by mouth at bedtime.     . Multiple Vitamin (MULTIVITAMIN WITH MINERALS) TABS tablet Take 1 tablet by mouth daily. Centrum    . nitroGLYCERIN (NITROSTAT) 0.4 MG SL tablet PLACE 1 TABLET (0.4 MG TOTAL) UNDER THE TONGUE EVERY 5 (FIVE) MINUTES X 3 DOSES AS NEEDED FOR CHEST PAIN. 75 tablet 2  . omeprazole (PRILOSEC) 20 MG capsule Take 20 mg by mouth daily.    . potassium chloride SA (KLOR-CON M20) 20 MEQ tablet Take 1 tablet (20 mEq total) by mouth 2 (two) times daily. Please keep upcoming appt in January with Dr. Acie Fredrickson before anymore refills. Thank you 90 tablet 0  . torsemide (DEMADEX) 20 MG tablet TAKE 2 TABLETS (40 MG TOTAL) BY MOUTH 2 (TWO) TIMES DAILY. 360 tablet 1  . valACYclovir (VALTREX) 1000 MG tablet Take 1 tablet (1,000 mg total) by mouth every 12 (twelve) hours as needed (cold sores). (Patient taking differently: Take 2 g by mouth every 12 (twelve) hours as needed (cold sores). )    . vitamin E 1000 UNIT capsule Take 1,000 Units by mouth at bedtime.     No current facility-administered medications for this visit.    Allergies:   Adhesive [tape]   Social History:  The patient  reports that she quit smoking about 36 years ago. Her smoking use included cigarettes. She started smoking about 41 years ago. She has a 5.00 pack-year smoking history. She has never used smokeless tobacco. She reports current alcohol use. She reports that she does not use drugs.   Family History:  The patient's family history includes Arrhythmia in her mother; Colon cancer in her maternal aunt; Dementia in her mother; Diabetes in her mother and paternal grandfather; Heart attack in her brother; Heart disease in her paternal aunt; Hypertension in her mother; Prostate cancer in her maternal grandfather; Suicidality in her father.   ROS:  Please see the history of present  illness.   Otherwise, review of systems is positive for none.   All other systems are reviewed and negative.   PHYSICAL EXAM: VS:  Ht 5' 6.5" (1.689 m)   BMI 42.99 kg/m  , BMI Body mass index is 42.99 kg/m. GEN: Well nourished, well developed, in no acute distress  HEENT: normal  Neck: no JVD, carotid bruits, or masses Cardiac: irregular; no murmurs, rubs, or gallops,no edema  Respiratory:  clear to auscultation  bilaterally, normal work of breathing GI: soft, nontender, nondistended, + BS MS: no deformity or atrophy  Skin: warm and dry, device site well healed Neuro:  Strength and sensation are intact Psych: euthymic mood, full affect  EKG:  EKG is ordered today. Personal review of the ekg ordered shows atrial flutter, rate 88  Personal review of the device interrogation today. Results in [place]: 04/10/2019: ALT 40; Platelets 265.0; TSH 3.48 04/16/2019: Hemoglobin 15.3 04/28/2019: BUN 24; Creatinine, Ser 1.07; Potassium 4.4; Sodium 138    Lipid Panel     Component Value Date/Time   CHOL 151 04/10/2019 0801   CHOL 116 10/16/2016 0850   TRIG 160.0 (H) 04/10/2019 0801   HDL 50.80 04/10/2019 0801   HDL 42 10/16/2016 0850   CHOLHDL 3 04/10/2019 0801   VLDL 32.0 04/10/2019 0801   LDLCALC 68 04/10/2019 0801   LDLCALC 46 10/16/2016 0850     Wt Readings from Last 3 Encounters:  04/23/19 270 lb 6.4 oz (122.7 kg)  04/10/19 270 lb 6.4 oz (122.7 kg)  03/13/19 260 lb (117.9 kg)      Other studies Reviewed: Additional studies/ records that were reviewed today include: TTE 03/28/16  Review of the above records today demonstrates:  - Left ventricle: The cavity size was normal. Wall thickness was   increased in a pattern of mild LVH. Systolic function was normal.   The estimated ejection fraction was in the range of 55% to 60%.   Indeterminant diastolic function (atrial fibrillation). Wall   motion was normal; there were no regional wall motion    abnormalities. - Aortic valve: There was no stenosis. - Mitral valve: Mildly calcified annulus. There was no significant   regurgitation. - Left atrium: The atrium was mildly dilated. - Right ventricle: The cavity size was normal. Pacer wire or   catheter noted in right ventricle. Systolic function was normal. - Tricuspid valve: Peak RV-RA gradient (S): 22 mm Hg. - Pulmonary arteries: PA peak pressure: 30 mm Hg (S). - Systemic veins: IVC measured 2.3 cm with normal respirophasic   variation, suggesting RA pressure 8 mmHg.  LHC/RHC 05/09/17 1.  Single-vessel coronary artery disease with continued patency of the stented segment in the right coronary artery and chronic occlusion of a small acute marginal branch collateralized by the left coronary artery 2.  Patent left main, LAD, and left circumflex without significant stenosis 3.  Vigorous LV systolic function with mildly elevated LVEDP 4.  Severe pulmonary hypertension with mean pulmonary artery pressure of 48 mmHg.  Because of high cardiac output (8.5 L/min), the patient's pulmonary vascular resistance is only mildly increased at 3 [place] units.  ASSESSMENT AND PLAN:  1.  Persistent atrial fibrillation/flutter: Status post AF ablation x3, most recent 03/14/2019.  Currently on Eliquis and diltiazem.  CHA2DS2-VASc of 3.  Unfortunately she returns today in atrial flutter.  At her last ablation, her pulmonary veins in her posterior wall were isolated.  We Zainab Crumrine plan to start her on sotalol and plan for cardioversion.   2. Hypertension: Well-controlled  3. Obesity: Weight loss encouraged  4. Chronic diastolic heart failure: No signs of volume overload  5. Obstructive sleep apnea: CPAP compliance encouraged  6.  Sinus node dysfunction: Status post Saint Jude dual-chamber pacemaker.  Bipolar impedance has been high and she has now switched to unipolar.  No issues.  No changes.  Current medicines are reviewed at length with the patient today.    The patient does not have  concerns regarding her medicines.  The following changes were made today: Start sotalol  Labs/ tests ordered today include:  No orders of the defined types were placed in this encounter.    Disposition:   FU with Haji Delaine 3 months  Signed, Dwan Fennel Meredith Leeds, MD  06/17/2019 10:32 AM     [facility] [address] [place] [address] [phone] (office) [phone] (fax)

## 2019-06-17 NOTE — Patient Instructions (Addendum)
Medication Instructions:  Your physician has recommended you make the following change in your medication:  1. START Sotalol 40 mg twice a day  - start this medication this weekend  * If you need a refill on your cardiac medications before your next appointment, please call your pharmacy.   Labwork: None ordered  Testing/Procedures: None ordered  Follow-Up: You are scheduled for a follow-up appointment on 06/25/19 @ 11:45 am for a nurse visit EKG after Sotalol start.   Thank you for choosing [facility]!!   Trinidad Curet, RN [phone]  Any Other Special Instructions Will Be Listed Below (If Applicable).  Sotalol tablets (Betapace AF) What is this medicine? SOTALOL (SOE ta lole) is a beta-blocker. Beta-blockers reduce the workload on the heart and help it to beat more regularly. This medicine is used to treat patients with an atrial heart arrhythmia such as atrial fibrillation. This medicine can help your heart return to and maintain a normal rhythm. This medicine may be used for other purposes; ask your health care provider or pharmacist if you have questions. COMMON BRAND NAME(S): BETAPACE AF What should I tell my health care provider before I take this medicine? They need to know if you have any of these conditions:  diabetes  heart or vessel disease like slow heart rate, worsening heart failure, heart block, sick sinus syndrome or Raynaud's disease  history of low levels of potassium or magnesium  kidney disease  liver disease  lung or breathing disease, like asthma or emphysema  pheochromocytoma  recent heart attack  thyroid disease  an unusual or allergic reaction to sotalol, other beta-blockers, medicines, foods, dyes, or preservatives  pregnant or trying to get pregnant  breast-feeding How should I use this medicine? Take this medicine by mouth with a glass of water. Follow the directions on the prescription label. Take your doses at regular  intervals. Do not take your medicine more often than directed. Do not stop taking this medicine suddenly. This could lead to serious heart-related effects. Talk to your pediatrician regarding the use of this medicine in children. Special care may be needed. While this medicine may be used in children for selected conditions precautions do apply. Overdosage: If you think you have taken too much of this medicine contact a poison control center or emergency room at once. NOTE: This medicine is only for you. Do not share this medicine with others. What if I miss a dose? If you miss a dose, take it as soon as you can. If it is almost time for your next dose, take only that dose. Do not take double or extra doses. What may interact with this medicine? Do not take this medicine with any of the following medications:  amoxapine  arsenic trioxide  certain antibiotics like gatifloxacin, grepafloxacin, levofloxacin, moxifloxacin, sparfloxacin, telithromycin  cisapride  droperidol  haloperidol  [place]  maprotiline  medicines for malaria like chloroquine and halofantrine  medicines to control heart rhythm  methadone  pentamidine  phenothiazines like prochlorperazine, perphenazine, thioridazine, and others  pimozide  ranolazine  tricyclic antidepressants like amitriptyline, imipramine, nortriptyline, and others  vardenafil This medicine may also interact with the following medications:  antacids  certain antibiotics such as clarithromycin and erythromycin  clonidine  digoxin  medicines for angina or high blood pressure  medicines for colds and breathing difficulties  medicines for diabetes  other beta-blockers like atenolol, metoprolol, propranolol and others  ziprasidone This list may not describe all possible interactions. Give your health care provider  a list of all the medicines, herbs, non-prescription drugs, or dietary supplements you use. Also tell them if you  smoke, drink alcohol, or use illegal drugs. Some items may interact with your medicine. What should I watch for while using this medicine? You will be started on this medicine in a specialized facility for the first two or more days of treatment. Visit your doctor or health care professional for regular checks on your progress. Check your heart rate and blood pressure regularly while you are taking this medicine. Ask your doctor or health care professional what your heart rate and blood pressure should be, and when you should contact him or her. Your doctor or health care professional also may schedule regular blood tests and electrocardiograms to check your progress. Because your condition and the use of this medicine carry some risk, it is a good idea to carry an identification card, necklace or bracelet with details of your condition, medications, and doctor or health care professional. Dennis Bast may get drowsy or dizzy. Do not drive, use machinery, or do anything that needs mental alertness until you know how this drug affects you. Do not stand or sit up quickly, especially if you are an older patient. This reduces the risk of dizzy or fainting spells. Alcohol can make you more drowsy and dizzy. Avoid alcoholic drinks. Do not treat yourself for coughs, colds, or pain while you are taking this medicine without asking your doctor or health care professional for advice. Some ingredients may increase your blood pressure. If you are going to have surgery, tell your doctor or health care professional that you are taking this medicine. What side effects may I notice from receiving this medicine? Side effects that you should report to your doctor or health care professional as soon as possible:  allergic reactions like skin rash, itching or hives, swelling of the face, lips, or tongue  chest pain  cold, tingling, or numb hands or feet  confusion  diarrhea  difficulty breathing, wheezing  irregular  heartbeat  muscle aches and pains  slow heart rate  sweating  swollen legs or ankles  tremor, shakes  vomiting Side effects that usually do not require medical attention (report to your doctor or health care professional if they continue or are bothersome):  change in sex drive or performance  mental depression  nausea  weakness or tiredness This list may not describe all possible side effects. Call your doctor for medical advice about side effects. You may report side effects to FDA at 1-800-FDA-1088. Where should I keep my medicine? Keep out of the reach of children. Store at room temperature between 15 and 30 degrees C (59 and 86 degrees F). Throw away any unused medicine after the expiration date. NOTE: This sheet is a summary. It may not cover all possible information. If you have questions about this medicine, talk to your doctor, pharmacist, or health care provider.  2020 Elsevier/Gold Standard (2018-06-11 11:01:52)

## 2019-06-19 ENCOUNTER — Other Ambulatory Visit: Payer: Self-pay | Admitting: Internal Medicine

## 2019-06-20 ENCOUNTER — Other Ambulatory Visit: Payer: Self-pay | Admitting: Cardiovascular Disease

## 2019-06-21 ENCOUNTER — Other Ambulatory Visit: Payer: Self-pay | Admitting: Cardiology

## 2019-06-23 NOTE — Telephone Encounter (Signed)
Age 73, weight 122kg, SCr 1.07 on 04/28/19, afib indication, last OV Dec 2020

## 2019-06-25 ENCOUNTER — Other Ambulatory Visit: Payer: Self-pay

## 2019-06-25 ENCOUNTER — Ambulatory Visit (INDEPENDENT_AMBULATORY_CARE_PROVIDER_SITE_OTHER): Payer: PPO | Admitting: *Deleted

## 2019-06-25 VITALS — HR 89 | Ht 66.5 in | Wt 270.0 lb

## 2019-06-25 DIAGNOSIS — Z01812 Encounter for preprocedural laboratory examination: Secondary | ICD-10-CM

## 2019-06-25 DIAGNOSIS — I4819 Other persistent atrial fibrillation: Secondary | ICD-10-CM | POA: Diagnosis not present

## 2019-06-25 NOTE — Patient Instructions (Addendum)
Medication Instructions:  Your physician recommends that you continue on your current medications as directed. Please refer to the Current Medication list given to you today.  * If you need a refill on your cardiac medications before your next appointment, please call your pharmacy.   Labwork: Today: BMET & CBC If you have labs (blood work) drawn today and your tests are completely normal, you will receive your results only by:  [place] (if you have MyChart) OR  A paper copy in the mail If you have any lab test that is abnormal or we need to change your treatment, we will call you to review the results.  Testing/Procedures: Your physician has recommended that you have a Cardioversion (DCCV). Electrical Cardioversion uses a jolt of electricity to your heart either through paddles or wired patches attached to your chest. This is a controlled, usually prescheduled, procedure. Defibrillation is done under light anesthesia in the hospital, and you usually go home the day of the procedure. This is done to get your heart back into a normal rhythm. You are not awake for the procedure. Your provider has recommended a cardioversion.   You are scheduled for a cardioversion on 07/08/19 with Dr. Debara Pickett or associates. Please go to [facility] at 7:00 am.  Enter through the [place] not have any food or drink after midnight the night before this procedure.  You may take your medicines with a sip of water on the day of your procedure.  You will need someone to drive you home following your procedure.  Call the [place] office at [phone] if you have any questions, problems or concerns.     Follow-Up: You are scheduled for follow up with Dr. Curt Bears on 09/15/2019 @ 3:00 pm  Thank you for choosing [facility]!!   Trinidad Curet, RN [phone]

## 2019-06-25 NOTE — Progress Notes (Signed)
1.  Reason for visit: EKG   2.  Name of MD requesting visit:  Camnitz  3. H&P:  Persistent AFib  4.  ROS related to problem:  See epic  5.  Assessment and plan per MD:   EKG performed today:  AFib, HR 89. Dr. Curt Bears recommends DCCV. DCCV scheduled and instructions reviewed with pt.

## 2019-06-26 LAB — BASIC METABOLIC PANEL
BUN/Creatinine Ratio: 17 (ref 12–28)
BUN: 17 mg/dL (ref 8–27)
CO2: 25 mmol/L (ref 20–29)
Calcium: 9.4 mg/dL (ref 8.7–10.3)
Chloride: 97 mmol/L (ref 96–106)
Creatinine, Ser: 1.03 mg/dL — ABNORMAL HIGH (ref 0.57–1.00)
GFR calc Af Amer: 62 mL/min/{1.73_m2} (ref >59)
GFR calc non Af Amer: 54 mL/min/{1.73_m2} — ABNORMAL LOW (ref >59)
Glucose: 129 mg/dL — ABNORMAL HIGH (ref 65–99)
Potassium: 4.5 mmol/L (ref 3.5–5.2)
Sodium: 140 mmol/L (ref 134–144)

## 2019-06-26 LAB — CBC
Hematocrit: 44.4 % (ref 34.0–46.6)
Hemoglobin: 15.1 g/dL (ref 11.1–15.9)
MCH: 32.1 pg (ref 26.6–33.0)
MCHC: 34 g/dL (ref 31.5–35.7)
MCV: 94 fL (ref 79–97)
Platelets: 302 10*3/uL (ref 150–450)
RBC: 4.71 x10E6/uL (ref 3.77–5.28)
RDW: 11.4 % — ABNORMAL LOW (ref 11.7–15.4)
WBC: 8 10*3/uL (ref 3.4–10.8)

## 2019-06-30 ENCOUNTER — Telehealth: Payer: Self-pay | Admitting: *Deleted

## 2019-06-30 NOTE — Telephone Encounter (Signed)
Pt has been notified of her lab results by phone with verbal understanding. Pt states she was started on Sotalol 80 mg tablet with directions to take 1/2 tab BID. Pt states she was SOB on the Sotalol and her O2 levels were pretty low. She took her last dose of Sotalol on 06/27/19 PM. She states that her O2 levels are doing better in the low 90's and she is not feeling sob. I assured the pt that I will let Dr. Curt Bears know. I informed the pt that we will call back if Dr. Curt Bears has any new recommendations. Pt is scheduled for DCCV 07/08/19 with Dr. Debara Pickett. Pt thanked me for the call. The patient has been notified of the result and verbalized understanding.  All questions (if any) were answered. Julaine Hua, [facility] 06/30/2019 2:54 PM

## 2019-06-30 NOTE — Telephone Encounter (Signed)
-----   Message from Will Meredith Leeds, MD sent at 06/28/2019  2:52 PM EST ----- Stable preop labs

## 2019-07-01 MED ORDER — DRONEDARONE HCL 400 MG PO TABS: 400.0000 mg | ORAL_TABLET | Freq: Two times a day (BID) | ORAL | 3 refills | Status: DC

## 2019-07-01 NOTE — Telephone Encounter (Signed)
I s/w pt and went over recommendations to start Multaq 400 mg BID and to d/c Sotalol. Pt is agreeable to plan of care and will start Multaq on 07/08/19 morning of her cardioversion per Dr. Curt Bears recommendations. New Rx has been sent in. Pt thanked me for the call.

## 2019-07-01 NOTE — Telephone Encounter (Signed)
Patient to start Multaq 400 mg BID on the day of cardioversion.

## 2019-07-05 ENCOUNTER — Other Ambulatory Visit (HOSPITAL_COMMUNITY)
Admission: RE | Admit: 2019-07-05 | Discharge: 2019-07-05 | Disposition: A | Discharge status: Home / Self Care | Payer: PPO | Source: Ambulatory Visit | Attending: Internal Medicine | Admitting: Internal Medicine

## 2019-07-05 DIAGNOSIS — Z01812 Encounter for preprocedural laboratory examination: Secondary | ICD-10-CM | POA: Insufficient documentation

## 2019-07-05 DIAGNOSIS — Z20822 Contact with and (suspected) exposure to covid-19: Secondary | ICD-10-CM | POA: Diagnosis not present

## 2019-07-06 LAB — SARS CORONAVIRUS 2 (TAT 6-24 HRS): SARS Coronavirus 2: NEGATIVE

## 2019-07-07 ENCOUNTER — Telehealth: Payer: Self-pay | Admitting: Cardiovascular Disease

## 2019-07-07 NOTE — Telephone Encounter (Signed)
Follow up     Pt is calling back and says she is wondering if she needs to cancel the appt for tomorrow since her heart is back in rhythm. She says she doesn't want to be charged a fee    Please call

## 2019-07-07 NOTE — Telephone Encounter (Signed)
  Patient is scheduled to have a cardioversion tomorrow but she states her heart is back in rhythm today. She also would like to know if she needs to take the dronedarone (MULTAQ) 400 MG tablet that was prescribed.

## 2019-07-07 NOTE — Telephone Encounter (Signed)
Returned pt call. She reports she has been in rhythm for several days now.  Reviewed w/ Dr. Curt Bears who is agreeable to cancelling DCCV tomorrow. Called hospital and cancelled. Advised to start Multaq tomorrow as originally recommended. Pt will call if SE begin after starting Multaq. Patient verbalized understanding and agreeable to plan.

## 2019-07-08 ENCOUNTER — Ambulatory Visit (HOSPITAL_COMMUNITY): Admission: RE | Admit: 2019-07-08 | Payer: PPO | Source: Home / Self Care | Admitting: Internal Medicine

## 2019-07-08 ENCOUNTER — Encounter (HOSPITAL_COMMUNITY): Admission: RE | Payer: Self-pay | Source: Home / Self Care

## 2019-07-08 SURGERY — CARDIOVERSION
Anesthesia: General

## 2019-07-10 LAB — CUP PACEART INCLINIC DEVICE CHECK
Date Time Interrogation Session: 20201215100950
Implantable Lead Implant Date: 20081211
Implantable Lead Implant Date: 20081211
Implantable Lead Location: 753859
Implantable Lead Location: 753860
Implantable Pulse Generator Implant Date: 20081211
Pulse Gen Model: 5826
Pulse Gen Serial Number: 1999089

## 2019-07-11 ENCOUNTER — Encounter: Payer: Self-pay | Admitting: Cardiovascular Disease

## 2019-07-11 ENCOUNTER — Other Ambulatory Visit: Payer: Self-pay

## 2019-07-11 ENCOUNTER — Ambulatory Visit: Payer: PPO | Admitting: Cardiovascular Disease

## 2019-07-11 VITALS — BP 142/82 | HR 81 | Ht 65.6 in | Wt 269.0 lb

## 2019-07-11 DIAGNOSIS — I5033 Acute on chronic diastolic (congestive) heart failure: Secondary | ICD-10-CM | POA: Diagnosis not present

## 2019-07-11 DIAGNOSIS — I480 Paroxysmal atrial fibrillation: Secondary | ICD-10-CM

## 2019-07-11 MED ORDER — POTASSIUM CHLORIDE CRYS ER 20 MEQ PO TBCR: 20.0000 meq | EXTENDED_RELEASE_TABLET | Freq: Two times a day (BID) | ORAL | 3 refills | Status: DC

## 2019-07-11 MED ORDER — TORSEMIDE 20 MG PO TABS: 40.0000 mg | ORAL_TABLET | Freq: Two times a day (BID) | ORAL | 3 refills | Status: DC

## 2019-07-11 NOTE — Progress Notes (Signed)
[name] Date of Birth  [birth year] [facility] [address]    [place], [place]  [address] [phone]  Fax  [phone]   Problem list: 1. Atrial fibrillation - s/p ablation September 29, 2016 2. Status post pacemaker implantation 3. Coronary artery disease-status post PTCA and stenting of her right coronary artery 4. Hyperlipidemia 5. Hypertension 6. Obstructive sleep apnea-currently on CPAP 7.     [name] is a 74 year old female with a history of atrial fibrillation. She status post pacemaker implantation. She also has a history of coronary artery disease and is status post PTCA and stenting of the right coronary artery. She also has a history of hyperlipidemia, hypertension, and sleep apnea.  She uses a CPAP mask at night.  The CPAP mask has really helped her stay in normal sinus rhythm.  She complains of having some episodes of chest tightness. This occurs at times. One episode was relieved with sublingual nitroglycerin. She states that it is not nearly as severe as her previous episodes of angina.  January 13, 21014: She was hospitalized in November with severe shortness breath and "double pneumonia". She is slowly recovering.  She's looking forward to  getting back into her exercise program.  She has not been exercising at all.  She's had some leg edema.  She denies any chest pain or shortness breath.  She's has mild leg swelling for the past several months. She does not add salt any of her food. She tries to stay away from salty foods and processed meat.  September 18, 2013:  She remains short of breath  - especialy with exertion  Echo in Jan. 2014:   - Left ventricle: The cavity size was normal. Wall thickness was increased in a pattern of mild LVH. Systolic function was normal. The estimated ejection fraction was in the range of 60% to 65%. - Pulmonary arteries: PA peak pressure: 33mm Hg   October 03, 2013:  [name] was seen about 2 weeks ago with some  increasing shortness of breath. She was noted to have moderate pulmonary hypertension on echo. She started on diltiazem. She has fairly well preserved left ventricle systolic function.  We started her on Cardizem XL 180 mg a day. She has not done well since that medication in addition. She's more short of breath. She's gained about 5 pounds.  She presents today for her echocardiogram. She is very short of breath and was worked into the schedule. Her O2 saturations at rest were 86%. We placed  oxygen on her and her O2 saturations increased above 90%. She is clearly more short of breath after the addition of the diltiazem.   She has not been eating any extra salt .  Has been taking her lasix.   She denies any chest pain - specifically was asked about pleuretic CP.  Had orthopnea until we placed the oxygen on her.   10/20/2013: [name] was admitted to the hospital with severe shortness breath when I saw her several weeks ago. She was found to be volume overload. She was aggressively diuresed and lost about 10 pounds of weight. Cardiac catheterization following that diuresis revealed only minimal coronary artery irregularities. Her left ventricular systolic function was normal. Her PA pressures were moderately elevated with an estimated PA pressure of around 57.   She was discharged on the same dose of Lasix that she was on at admission.   She is avoiding salt.    Dec. 3, 2015:  [name] is a  74 yo followed for chronic diastolic CHf and pulmonary hypertension ( est. PA pressure of 70)  Drucilla has been back in the hospital since I last saw her.  She has  Increased her lasix which has helped.  She does a pretty good job of avoiding salt.   Sept. 27, 2016:  Is doing well.  Has some dyspnea - she thinks likely due to weight gain . No cardiac symptoms .  Needs to have back surgery .   October 06, 2015:  Overall doing well. Had her back surgery in Oct.  Wt. 267 lbs today   Jan. 4, 2018:  [name] is seen  back today following a recent hospitalization in Sept. 2017.   She was complaining of dyspnea , cough. She was cardioverted  She has diastolic dysfunction   She is now back in atrial fib. She new that she had converted back into a-fib.   HR are typically elevated when she is in atrial fib.   Has had some right flank pain .   Was found to have an atrophic right kidney.  Was in the hospital in [place], [place].   Has not tried a higher dose of Tikosyn.    Labs from her recent hospitalization on 06/24/2016 show a potassium level of 4.3. Her magnesium level was 2.0.  October 16, 2016:  [name] is seen today  She has had an A-Fib ablation ( September 29, 2016) since I have seen her She is very shaky today - having trouble getting her breath while walking in today , her lasix was held following her ablation.    She restarted her Lasix 2 weeks ago .   She is feeling generally better since restarting the Lasix   11/21/2016: [name] was recently hospitalized with acute on chronic diastolic congestive heart failure. She had been traveling quite a bit and had been eating lots of salty food. She had also been holding various doses of Lasix set she was traveling. She was diuresed 7 lbs.  Feeling much better   She was supposed to decrease her amio to 200 mg daily but has continued on the 200 mg BID.  Nov. 6, 2018: [name] is seen today for further evaluation of her increasing shortness of breath.  Her amiodarone was stopped several weeks ago. Had left knee replacement 2 weeks ago .   Is doing well.  Is having some chest tightness . Is avoiding salt.   August 06, 2017:  [name] is seen today  Heart catheterization on November 7 revealed a patent stent in the right coronary artery and a chronic total occlusion of a small acute marginal branch.  There was lateral filling of this acute marginal from the left system. She has vigorous left ventricular systolic function with mildly elevated left ventricular end-diastolic  pressure.  She had moderate pulmonary hypertension with PA pressure of 48 mmHg.  The patient had a high cardiac output (8.5 L/min)  The pulmonary vascular resistance is only mildly elevated at 3 Wood units.  Because of her pulmonary hypertension and the discrepancy between the wedge pressure in the left ventricular end-diastolic pressure.  There was some concerning of pulmonary vein stenosis in this patient who has previously had A. fib ablation.  CT angiogram reveal no pulmonary vein stenosis   Doing well Has lost 7 lbs since Nov.   Overall doing well Is taking deeper breaths - is feeling better   Aug. 26, 2019:  Emmali is seen today for work in visit. She  had a repeat A. fib ablation on August 8.  She then had a cardioversion on August 19.  Her leg was slightly tender after the ablation but over the past several days it has developed redness and swelling.  She presents today as a work in visit for the evaluation.  No CP   Jan. 8, 2021:  She is doing very well.  She is maintaining sinus rhythm.  She overall feels well. Had a cutaneous yeast infection.   Cleared with nystatin  No CP , no dyspnea Not much exercise .   Has back issues.  Current Outpatient Medications on File Prior to Visit  Medication Sig Dispense Refill  . acetaminophen (TYLENOL) 500 MG tablet Take 1,000 mg by mouth daily.     . Ascorbic Acid (VITAMIN C) 1000 MG tablet Take 1,000 mg by mouth daily.     [name] atorvastatin (LIPITOR) 40 MG tablet TAKE 1 TABLET BY MOUTH EVERY DAY 90 tablet 3  . Calcium Carbonate-Vitamin D (CALCIUM 600+D PO) Take 1 tablet by mouth at bedtime.     . Cholecalciferol (VITAMIN D3) 125 MCG (5000 UT) TABS Take 5,000 Units by mouth every evening.    . diltiazem (CARDIZEM CD) 240 MG 24 hr capsule TAKE 1 CAPSULE BY MOUTH EVERY DAY **DOSE INCREASE** 90 capsule 2  . diphenhydramine-acetaminophen (TYLENOL PM) 25-500 MG TABS tablet Take 2 tablets by mouth at bedtime.     . dronedarone (MULTAQ) 400 MG tablet  Take 1 tablet (400 mg total) by mouth 2 (two) times daily with a meal. 180 tablet 3  . ELIQUIS 5 MG TABS tablet TAKE 1 TABLET BY MOUTH TWICE A DAY 180 tablet 1  . FLUoxetine (PROZAC) 20 MG capsule TAKE 1 CAPSULE BY MOUTH EVERY DAY 90 capsule 0  . fluticasone (FLONASE) 50 MCG/ACT nasal spray Place 1 spray into both nostrils daily.     [name] loratadine (CLARITIN) 10 MG tablet TAKE 1 TABLET BY MOUTH EVERY DAY 90 tablet 1  . Lysine 1000 MG TABS Take 1,000 mg by mouth at bedtime.     . Multiple Vitamin (MULTIVITAMIN WITH MINERALS) TABS tablet Take 1 tablet by mouth daily. Centrum    . nitroGLYCERIN (NITROSTAT) 0.4 MG SL tablet PLACE 1 TABLET (0.4 MG TOTAL) UNDER THE TONGUE EVERY 5 (FIVE) MINUTES X 3 DOSES AS NEEDED FOR CHEST PAIN. 75 tablet 2  . omeprazole (PRILOSEC) 20 MG capsule Take 20 mg by mouth daily before breakfast.     . Polyethyl Glycol-Propyl Glycol (SYSTANE) 0.4-0.3 % SOLN Place 1 drop into both eyes 2 (two) times daily as needed (dry/irritated eyes.).    [name] potassium chloride SA (KLOR-CON M20) 20 MEQ tablet Take 1 tablet (20 mEq total) by mouth 2 (two) times daily. Please keep upcoming appt in January with Dr. Acie Fredrickson before anymore refills. Thank you 90 tablet 0  . torsemide (DEMADEX) 20 MG tablet TAKE 2 TABLETS (40 MG TOTAL) BY MOUTH 2 (TWO) TIMES DAILY. 360 tablet 1  . valACYclovir (VALTREX) 1000 MG tablet Take 1 tablet (1,000 mg total) by mouth every 12 (twelve) hours as needed (cold sores). (Patient taking differently: Take 2 g by mouth every 12 (twelve) hours as needed (fever blisters/cold sores.). )    . vitamin B-12 (CYANOCOBALAMIN) 1000 MCG tablet Take 1,000 mcg by mouth daily.    . vitamin E 1000 UNIT capsule Take 1,000 Units by mouth at bedtime.     No current facility-administered medications on file prior to visit.    Allergies  Allergen Reactions  . Adhesive [Tape] Rash and Other (See Comments)    Allergic to EKG stickers and defibrillation pads.    Past Medical History:   Diagnosis Date  . Anticoagulant long-term use    pradaxa  . Anxiety   . Arthritis    "fingers, lower back" (04/23/2017   . CAD (coronary artery disease) 1914,7829   post PTCA with bare-metal stenting to mid RCA in December 2004     . CHF (congestive heart failure) ([place])   . Chronic atrial fibrillation ([place]) 06/2007   Tachybradycardia pacemaker  . Chronic kidney disease    10% function - ?R, other kidney is compensating    . CVA (cerebral vascular accident) [facility]) 5621,[id number]   denies residual on 04/23/2017  . Depression   . Diplopia 06/19/2008   Qualifier: Diagnosis of  By: [name] MD, [name]   . Dysrhythmia    ATRIAL FIBRILATION  . Edema of lower extremity   . Hyperlipidemia   . Hypertension   . Inferior myocardial infarction [facility])    acute inferior wall mi/other medical hx  . Myocardial infarction ([place]) [id number]  . Obesity   . OSA on CPAP    last test- 2010  . Pacemaker   . Pneumonia 2014   tx. ----  [facility]  . Pulmonary hypertension ([place])    moderate pulmonary hypertension by 10/2016 echo and 10/2013 cardiac cath  . Shortness of breath   . Skin cancer    "cut off right Primm; burned off LLE" (04/23/2017)  . Sleep apnea   . Spondylolisthesis   . TIA (transient ischemic attack) 2008  . Unspecified hemorrhoids without mention of complication 5/78/4696   Colonoscopy--Dr. Carlean Purl     Past Surgical History:  Procedure Laterality Date  . ABDOMINAL HYSTERECTOMY    . APPENDECTOMY  1984  . ATRIAL FIBRILLATION ABLATION N/A 09/29/2016   Procedure: Atrial Fibrillation Ablation;  Surgeon: Will Meredith Leeds, MD;  Location: [place] CV LAB;  Service: Cardiovascular;  Laterality: N/A;  . ATRIAL FIBRILLATION ABLATION N/A 02/07/2018   Procedure: ATRIAL FIBRILLATION ABLATION;  Surgeon: Constance Haw, MD;  Location: [place] [facility];  Service: Cardiovascular;  Laterality: N/A;  . ATRIAL FIBRILLATION ABLATION N/A 03/13/2019   Procedure: ATRIAL FIBRILLATION ABLATION;   Surgeon: Constance Haw, MD;  Location: [place] CV [facility];  Service: Cardiovascular;  Laterality: N/A;  . BACK SURGERY    . CARDIOVERSION N/A 09/12/2017   Procedure: CARDIOVERSION;  Surgeon: Jerline Pain, MD;  Location: [facility] ENDOSCOPY;  Service: Cardiovascular;  Laterality: N/A;  . CARDIOVERSION N/A 12/13/2017   Procedure: CARDIOVERSION;  Surgeon: Sanda Klein, MD;  Location: [place] ENDOSCOPY;  Service: Cardiovascular;  Laterality: N/A;  . CARDIOVERSION N/A 02/18/2018   Procedure: CARDIOVERSION;  Surgeon: Dorothy Spark, MD;  Location: [facility] ENDOSCOPY;  Service: Cardiovascular;  Laterality: N/A;  . CARDIOVERSION N/A 05/20/2018   Procedure: CARDIOVERSION;  Surgeon: [name], MD;  Location: [facility] ENDOSCOPY;  Service: Cardiovascular;  Laterality: N/A;  . CARDIOVERSION N/A 04/16/2019   Procedure: CARDIOVERSION;  Surgeon: Donato Heinz, MD;  Location: [place];  Service: Endoscopy;  Laterality: N/A;  . CATARACT EXTRACTION W/ INTRAOCULAR LENS  IMPLANT, BILATERAL Bilateral 01/15/2017- 03/2017  . CHOLECYSTECTOMY    . CORONARY ANGIOPLASTY  X 2  . CORONARY ANGIOPLASTY WITH STENT PLACEMENT  1998; ~ 2007; ?date   "1 stent; replaced stent; not sure when I got the last stent" (04/23/2017)  . DOPPLER ECHOCARDIOGRAPHY  2009  . ELECTROPHYSIOLOGIC STUDY N/A  03/31/2016   Procedure: Cardioversion;  Surgeon: Evans Lance, MD;  Location: [place] CV [facility];  Service: Cardiovascular;  Laterality: N/A;  . ELECTROPHYSIOLOGIC STUDY N/A 08/04/2016   Procedure: Cardioversion;  Surgeon: Evans Lance, MD;  Location: [place] [facility];  Service: Cardiovascular;  Laterality: N/A;  . INSERT / REPLACE / REMOVE PACEMAKER  06/2007  . IR RADIOLOGY PERIPHERAL GUIDED IV START  01/31/2018  . IR US GUIDE VASC ACCESS LEFT  01/31/2018  . JOINT REPLACEMENT    . LAPAROSCOPIC CHOLECYSTECTOMY  1994  . LEFT AND RIGHT HEART CATHETERIZATION WITH CORONARY ANGIOGRAM N/A 10/06/2013   Procedure: LEFT AND RIGHT HEART  CATHETERIZATION WITH CORONARY ANGIOGRAM;  Surgeon: Troy Sine, MD;  Location: [facility] CATH LAB;  Service: Cardiovascular;  Laterality: N/A;  . LEFT OOPHORECTOMY Left ~ 1989  . POSTERIOR LUMBAR FUSION  2000s - 04/2015 X 3   L3-4; L4-5; L2-3; Dr Trenton Gammon  . RIGHT/LEFT HEART CATH AND CORONARY ANGIOGRAPHY N/A 05/09/2017   Procedure: RIGHT/LEFT HEART CATH AND CORONARY ANGIOGRAPHY;  Surgeon: Sherren Mocha, MD;  Location: [place] CV LAB;  Service: Cardiovascular;  Laterality: N/A;  . SKIN CANCER EXCISION Right    [place]  . TEE WITHOUT CARDIOVERSION N/A 09/29/2016   Procedure: TRANSESOPHAGEAL ECHOCARDIOGRAM (TEE);  Surgeon: Jerline Pain, MD;  Location: [place];  Service: Cardiovascular;  Laterality: N/A;  . TOTAL ABDOMINAL HYSTERECTOMY  1984   "uterus & right ovary"  . TOTAL KNEE ARTHROPLASTY Left 04/23/2017  . TOTAL KNEE ARTHROPLASTY Left 04/23/2017   Procedure: TOTAL KNEE ARTHROPLASTY;  Surgeon: Frederik Pear, MD;  Location: [place];  Service: Orthopedics;  Laterality: Left;  . ULTRASOUND GUIDANCE FOR VASCULAR ACCESS  05/09/2017   Procedure: Ultrasound Guidance For Vascular Access;  Surgeon: Sherren Mocha, MD;  Location: [place] CV LAB;  Service: Cardiovascular;;    Social History   Tobacco Use  Smoking Status Former Smoker  . Packs/day: 1.00  . Years: 5.00  . Pack years: 5.00  . Types: Cigarettes  . Start date: 05/11/1978  . Quit date: 07/03/1982  . Years since quitting: 37.0  Smokeless Tobacco Never Used    Social History   Substance and Sexual Activity  Alcohol Use Yes   Comment: 04/23/2017 "once q 6 months; glass of wine"    Family History  Problem Relation Age of Onset  . Suicidality Father        suicide death pt was 3 yrs, 11  . Arrhythmia Mother   . Hypertension Mother   . Diabetes Mother   . Dementia Mother   . Heart attack Brother   . Heart disease Paternal Aunt   . Prostate cancer Maternal Grandfather   . Diabetes Paternal Grandfather        fathers side  of the family  . Colon cancer Maternal Aunt     Reviw of Systems:    Physical Exam: Blood pressure (!) 142/82, pulse 81, height 5' 5.6" (1.666 m), weight 269 lb (122 kg), SpO2 94 %.  GEN:  Well nourished, well developed in no acute distress HEENT: Normal NECK: No JVD; No carotid bruits LYMPHATICS: No lymphadenopathy CARDIAC: RRR , no murmurs, rubs, gallops RESPIRATORY:  Clear to auscultation without rales, wheezing or rhonchi  ABDOMEN: Soft, non-tender, non-distended MUSCULOSKELETAL:  No edema; No deformity  SKIN: Warm and dry NEUROLOGIC:  Alert and oriented x 3    ECG:    Assessment / Plan:  1.  Cutaneous fungal lesion:  Better , resolved.  1. Acute on chronic diastolic CHF:   Stable,   Advised better diet,  Salt control,  Weight loss   2. Atrial fibrillation -    is back in n[place].   doesnot want to sart Multaq.   Advised her to discuss with dr. Curt Bears  2. Status post pacemaker implantation:    3. Coronary artery disease-   4. Hyperlipidemia -     Labs from oct. 202 look good    5. Hypertension -      BP is ok    6. Obstructive sleep apnea-currently on CPAP -   7. Spinal disc disease.       Mertie Moores, MD  07/11/2019 10:03 AM    [address],  [address] [place], [place]  [address] Pager [phone] Phone: [phone]; Fax: [phone]

## 2019-07-11 NOTE — Patient Instructions (Addendum)
Medication Instructions:  Your physician recommends that you continue on your current medications as directed. Please refer to the Current Medication list given to you today.  *Refills of your Torsemide and Potassium have been sent to your pharmacy  Lab Work: None Ordered   Testing/Procedures: None Ordered   Follow-Up: At [facility], you and your health needs are our priority.  As part of our continuing mission to provide you with exceptional heart care, we have created designated Provider Care Teams.  These Care Teams include your primary Cardiologist (physician) and Advanced Practice Providers (APPs -  Physician Assistants and Nurse Practitioners) who all work together to provide you with the care you need, when you need it.  Your next appointment:   6 month(s)  The format for your next appointment:   Either In Person or Virtual  Provider:   Richardson Dopp, PA-C, Robbie Lis, PA-C or Daune Perch, NP              [facility] Low Glycemic Diet (Source: [facility], 2006) Low Glycemic Foods (20-49) (Decrease risk of developing heart disease) Breakfast Cereals: All-Bran All-Bran Fruit 'n Oats Fiber One Oatmeal (not instant) Oat bran Fruits and fruit juices: (Limit to 1-2 servings per day) Apples Apricots (fresh & dried) Blackberries Blueberries Cherries Cranberries Peaches Pears Plums Prunes Grapefruit Raspberries Strawberries Tangerine Apple juice Grapefruit juice Tomato juice Beans and legumes (fresh-cooked): Black-eyed peas Butter beans Chick peas Lentils  Green beans Lima beans Kidney beans Navy beans Pinto beans Snow peas Non-starchy vegetables: Asparagus, avocado, broccoli, cabbage, cauliflower, celery, cucumber, greens, lettuce, mushrooms, peppers, tomatoes, okra, onions, spinach, summer squash Grains: [place]ley Bulgur Rye Wild rice Nuts and oils : Almonds Peanuts Sunflower seeds Hazelnuts Pecans Walnuts Oils that are liquid  at room temperature Dairy, fish, meat, soy, and eggs: Milk, skim Lowfat cheese Yogurt, lowfat, fruit sugar sweetened Lean red meat Fish  Skinless chicken & [place] Shellfish Egg whites (up to 3 daily) Soy products  Egg yolks (up to 7 or _____ per week) Moderate Glycemic Foods (50-69) Breakfast Cereals: Bran Buds Bran Chex Just Right Mini-Wheats  Special K Swiss muesli Fruits: Banana (under-ripe) Dates Figs Grapes Kiwi Mango Oranges Raisins Fruit Juices: Cranberry juice Orange juice Beans and legumes: [place]-type baked beans Canned pinto, kidney, or navy beans Green peas Vegetables: Beets Carrots  Sweet potato Yam Corn on the cob Breads: Pita (pocket) bread Oat bran bread Pumpernickel bread Rye bread Wheat bread, high fiber  Grains: Cornmeal Rice, brown Rice, white Couscous Pasta: Macaroni Pizza, cheese Ravioli, meat filled Spaghetti, white  Nuts: Cashews Macadamia Snacks: Chocolate Ice cream, lowfat Muffin Popcorn High Glycemic Foods (70-100)  Breakfast Cereals: Cheerios Corn Chex Corn Flakes Cream of Wheat Grape Nuts Grape Nut Flakes Grits Nutri-Grain Puffed Rice Puffed Wheat Rice Chex Rice Krispies Shredded Wheat Team Total Fruits: Pineapple Watermelon Banana (over-ripe) Beverages: Sodas, sweet tea, pineapple juice Vegetables: Potato, baked, boiled, fried, mashed [place] fries Canned or frozen corn Parsnips Winter squash Breads: Most breads (white and whole grain) Bagels Bread sticks Bread stuffing Kaiser roll Dinner rolls Grains: Rice, instant Tapioca, with milk Candy and most cookies Snacks: Donuts Corn chips Jelly beans Pretzels Pastries

## 2019-07-22 ENCOUNTER — Ambulatory Visit: Payer: PPO | Attending: Internal Medicine

## 2019-07-22 DIAGNOSIS — Z23 Encounter for immunization: Secondary | ICD-10-CM | POA: Insufficient documentation

## 2019-07-22 NOTE — Progress Notes (Signed)
   Covid-19 Vaccination Clinic  Name:  RAYANN JOLLEY    MRN: [id number] DOB: [birth year]  07/22/2019  Ms. Arenivas was observed post Covid-19 immunization for 15 minutes without incidence. She was provided with Vaccine Information Sheet and instruction to access the V-Safe system.   Ms. Brier was instructed to call 911 with any severe reactions post vaccine: [name] Difficulty breathing  . Swelling of your face and throat  . A fast heartbeat  . A bad rash all over your body  . Dizziness and weakness    Immunizations Administered    Name Date Dose VIS Date Route   Pfizer COVID-19 Vaccine 07/22/2019  5:32 PM 0.3 mL 06/13/2019 Intramuscular   Manufacturer: [place]   Lot: [id number]   [place]: [id number]

## 2019-07-23 DIAGNOSIS — G4733 Obstructive sleep apnea (adult) (pediatric): Secondary | ICD-10-CM | POA: Diagnosis not present

## 2019-08-10 ENCOUNTER — Ambulatory Visit: Payer: PPO | Attending: Internal Medicine

## 2019-08-10 DIAGNOSIS — Z23 Encounter for immunization: Secondary | ICD-10-CM

## 2019-08-10 NOTE — Progress Notes (Signed)
   Covid-19 Vaccination Clinic  Name:  [name]    MRN: [id number] DOB: 04/25/1946  08/10/2019  [name] was observed post Covid-19 immunization for 15 minutes without incidence. She was provided with Vaccine Information Sheet and instruction to access the V-Safe system.   [name] was instructed to call 911 with any severe reactions post vaccine: [name] Difficulty breathing  . Swelling of your face and throat  . A fast heartbeat  . A bad rash all over your body  . Dizziness and weakness    Immunizations Administered    Name Date Dose VIS Date Route   Pfizer COVID-19 Vaccine 08/10/2019  8:08 AM 0.3 mL 06/13/2019 Intramuscular   Manufacturer: [place]   Lot: [id number]   [place]: [id number]

## 2019-08-17 ENCOUNTER — Other Ambulatory Visit: Payer: Self-pay | Admitting: Cardiovascular Disease

## 2019-09-11 ENCOUNTER — Other Ambulatory Visit: Payer: Self-pay | Admitting: Internal Medicine

## 2019-09-11 ENCOUNTER — Other Ambulatory Visit: Payer: Self-pay | Admitting: Cardiology

## 2019-09-15 ENCOUNTER — Encounter: Payer: Self-pay | Admitting: Cardiology

## 2019-09-15 ENCOUNTER — Ambulatory Visit: Payer: PPO | Admitting: Cardiology

## 2019-09-15 ENCOUNTER — Other Ambulatory Visit: Payer: Self-pay

## 2019-09-15 VITALS — BP 142/76 | HR 75 | Ht 65.0 in | Wt 272.0 lb

## 2019-09-15 DIAGNOSIS — I495 Sick sinus syndrome: Secondary | ICD-10-CM

## 2019-09-15 DIAGNOSIS — I4819 Other persistent atrial fibrillation: Secondary | ICD-10-CM

## 2019-09-15 LAB — CUP PACEART INCLINIC DEVICE CHECK
Date Time Interrogation Session: 20210315170032
Implantable Lead Implant Date: 20081211
Implantable Lead Implant Date: 20081211
Implantable Lead Location: 753859
Implantable Lead Location: 753860
Implantable Pulse Generator Implant Date: 20081211
Pulse Gen Model: 5826
Pulse Gen Serial Number: 1999089

## 2019-09-15 NOTE — Progress Notes (Signed)
Electrophysiology Office Note   Date:  09/15/2019   ID:  [name], DOB 1946/03/01, MRN [id number]  PCP:  [name], MD  Primary Electrophysiologist: Gaye Alken, MD    No chief complaint on file.    History of Present Illness: [name] is a 74 y.o. female who presents today for electrophysiology evaluation.   She has a history of coronary artery disease status post stent to the RCA in 2004, CHF, atrial fibrillation with tachybradycardia syndrome status post pacemaker, CKD, hypertension, hyperlipidemia, inferior wall MI, sleep apnea on CPAP, stroke. She is maintaining sinus rhythm on CPAP and dofetilide until she got an infection and when in atrial fibrillation. Had ablation on 09/29/16.  Unfortunately, over the last few months, she has required 2 cardioversions.  She had repeat ablation 02/07/2018.  Since that time, she has had both recurrent atrial fibrillation and atypical atrial flutter.  Today, denies symptoms of palpitations, chest pain, shortness of breath, orthopnea, PND, lower extremity edema, claudication, dizziness, presyncope, syncope, bleeding, or neurologic sequela. The patient is tolerating medications without difficulties.  Overall she is doing well.  She has no chest pain or shortness of breath.  Is able to do all of her daily activities.  She is been working on losing weight with walking.  Otherwise no major complaints.   Past Medical History:  Diagnosis Date  . Anticoagulant long-term use    pradaxa  . Anxiety   . Arthritis    "fingers, lower back" (04/23/2017   . CAD (coronary artery disease) [id number]   post PTCA with bare-metal stenting to mid RCA in December 2004     . CHF (congestive heart failure) ([place])   . Chronic atrial fibrillation ([place]) 06/2007   Tachybradycardia pacemaker  . Chronic kidney disease    10% function - ?R, other kidney is compensating    . CVA (cerebral vascular accident) [facility]) [id number]   denies residual on  04/23/2017  . Depression   . Diplopia 06/19/2008   Qualifier: Diagnosis of  By: [name] MD, [name]   . Dysrhythmia    ATRIAL FIBRILATION  . Edema of lower extremity   . Hyperlipidemia   . Hypertension   . Inferior myocardial infarction [facility])    acute inferior wall mi/other medical hx  . Myocardial infarction ([place]) [id number]  . Obesity   . OSA on CPAP    last test- 2010  . Pacemaker   . Pneumonia 2014   tx. ----  [facility]  . Pulmonary hypertension ([place])    moderate pulmonary hypertension by 10/2016 echo and 10/2013 cardiac cath  . Shortness of breath   . Skin cancer    "cut off right [place]; burned off LLE" (04/23/2017)  . Sleep apnea   . Spondylolisthesis   . TIA (transient ischemic attack) 2008  . Unspecified hemorrhoids without mention of complication 2/44/0102   Colonoscopy--Dr. Carlean Purl    Past Surgical History:  Procedure Laterality Date  . ABDOMINAL HYSTERECTOMY    . APPENDECTOMY  1984  . ATRIAL FIBRILLATION ABLATION N/A 09/29/2016   Procedure: Atrial Fibrillation Ablation;  Surgeon: Cambreigh Dearing Meredith Leeds, MD;  Location: [place] CV LAB;  Service: Cardiovascular;  Laterality: N/A;  . ATRIAL FIBRILLATION ABLATION N/A 02/07/2018   Procedure: ATRIAL FIBRILLATION ABLATION;  Surgeon: Constance Haw, MD;  Location: [place] CV LAB;  Service: Cardiovascular;  Laterality: N/A;  . ATRIAL FIBRILLATION ABLATION N/A 03/13/2019   Procedure: ATRIAL FIBRILLATION ABLATION;  Surgeon: Constance Haw,  MD;  Location: [place] CV LAB;  Service: Cardiovascular;  Laterality: N/A;  . BACK SURGERY    . CARDIOVERSION N/A 09/12/2017   Procedure: CARDIOVERSION;  Surgeon: Jerline Pain, MD;  Location: [facility] ENDOSCOPY;  Service: Cardiovascular;  Laterality: N/A;  . CARDIOVERSION N/A 12/13/2017   Procedure: CARDIOVERSION;  Surgeon: Sanda Klein, MD;  Location: [place] ENDOSCOPY;  Service: Cardiovascular;  Laterality: N/A;  . CARDIOVERSION N/A 02/18/2018   Procedure: CARDIOVERSION;   Surgeon: Dorothy Spark, MD;  Location: [facility] ENDOSCOPY;  Service: Cardiovascular;  Laterality: N/A;  . CARDIOVERSION N/A 05/20/2018   Procedure: CARDIOVERSION;  Surgeon: [name], MD;  Location: [facility] ENDOSCOPY;  Service: Cardiovascular;  Laterality: N/A;  . CARDIOVERSION N/A 04/16/2019   Procedure: CARDIOVERSION;  Surgeon: Donato Heinz, MD;  Location: [place];  Service: Endoscopy;  Laterality: N/A;  . CATARACT EXTRACTION W/ INTRAOCULAR LENS  IMPLANT, BILATERAL Bilateral 01/15/2017- 03/2017  . CHOLECYSTECTOMY    . CORONARY ANGIOPLASTY  X 2  . CORONARY ANGIOPLASTY WITH STENT PLACEMENT  1998; ~ 2007; ?date   "1 stent; replaced stent; not sure when I got the last stent" (04/23/2017)  . DOPPLER ECHOCARDIOGRAPHY  2009  . ELECTROPHYSIOLOGIC STUDY N/A 03/31/2016   Procedure: Cardioversion;  Surgeon: Evans Lance, MD;  Location: [place] CV LAB;  Service: Cardiovascular;  Laterality: N/A;  . ELECTROPHYSIOLOGIC STUDY N/A 08/04/2016   Procedure: Cardioversion;  Surgeon: Evans Lance, MD;  Location: [place] CV LAB;  Service: Cardiovascular;  Laterality: N/A;  . INSERT / REPLACE / REMOVE PACEMAKER  06/2007  . IR RADIOLOGY PERIPHERAL GUIDED IV START  01/31/2018  . IR US GUIDE VASC ACCESS LEFT  01/31/2018  . JOINT REPLACEMENT    . LAPAROSCOPIC CHOLECYSTECTOMY  1994  . LEFT AND RIGHT HEART CATHETERIZATION WITH CORONARY ANGIOGRAM N/A 10/06/2013   Procedure: LEFT AND RIGHT HEART CATHETERIZATION WITH CORONARY ANGIOGRAM;  Surgeon: Troy Sine, MD;  Location: [facility] CATH LAB;  Service: Cardiovascular;  Laterality: N/A;  . LEFT OOPHORECTOMY Left ~ 1989  . POSTERIOR LUMBAR FUSION  2000s - 04/2015 X 3   L3-4; L4-5; L2-3; Dr Trenton Gammon  . RIGHT/LEFT HEART CATH AND CORONARY ANGIOGRAPHY N/A 05/09/2017   Procedure: RIGHT/LEFT HEART CATH AND CORONARY ANGIOGRAPHY;  Surgeon: Sherren Mocha, MD;  Location: [place] CV LAB;  Service: Cardiovascular;  Laterality: N/A;  . SKIN CANCER EXCISION Right     [place]  . TEE WITHOUT CARDIOVERSION N/A 09/29/2016   Procedure: TRANSESOPHAGEAL ECHOCARDIOGRAM (TEE);  Surgeon: Jerline Pain, MD;  Location: [place];  Service: Cardiovascular;  Laterality: N/A;  . TOTAL ABDOMINAL HYSTERECTOMY  1984   "uterus & right ovary"  . TOTAL KNEE ARTHROPLASTY Left 04/23/2017  . TOTAL KNEE ARTHROPLASTY Left 04/23/2017   Procedure: TOTAL KNEE ARTHROPLASTY;  Surgeon: Frederik Pear, MD;  Location: [place];  Service: Orthopedics;  Laterality: Left;  . ULTRASOUND GUIDANCE FOR VASCULAR ACCESS  05/09/2017   Procedure: Ultrasound Guidance For Vascular Access;  Surgeon: Sherren Mocha, MD;  Location: [place] CV LAB;  Service: Cardiovascular;;     Current Outpatient Medications  Medication Sig Dispense Refill  . acetaminophen (TYLENOL) 500 MG tablet Take 1,000 mg by mouth daily.     . Ascorbic Acid (VITAMIN C) 1000 MG tablet Take 1,000 mg by mouth daily.     [name] atorvastatin (LIPITOR) 40 MG tablet TAKE 1 TABLET BY MOUTH EVERY DAY 90 tablet 3  . Calcium Carbonate-Vitamin D (CALCIUM 600+D PO) Take 1 tablet by mouth at bedtime.     [name]  Cholecalciferol (VITAMIN D3) 125 MCG (5000 UT) TABS Take 5,000 Units by mouth every evening.    . diltiazem (CARDIZEM CD) 240 MG 24 hr capsule TAKE 1 CAPSULE BY MOUTH EVERY DAY **DOSE INCREASE** 90 capsule 2  . diphenhydramine-acetaminophen (TYLENOL PM) 25-500 MG TABS tablet Take 2 tablets by mouth at bedtime.     [name] ELIQUIS 5 MG TABS tablet TAKE 1 TABLET BY MOUTH TWICE A DAY 180 tablet 1  . FLUoxetine (PROZAC) 20 MG capsule TAKE 1 CAPSULE BY MOUTH EVERY DAY 90 capsule 0  . fluticasone (FLONASE) 50 MCG/ACT nasal spray Place 1 spray into both nostrils daily.     [name] KLOR-CON M20 20 MEQ tablet TAKE 1 TABLET BY MOUTH 2 TIMES DAILY. PLEASE KEEP UPCOMING APPT IN JANUARY WITH DR. Acie Fredrickson 90 tablet 3  . loratadine (CLARITIN) 10 MG tablet TAKE 1 TABLET BY MOUTH EVERY DAY 90 tablet 1  . Lysine 1000 MG TABS Take 1,000 mg by mouth at bedtime.     . Multiple  Vitamin (MULTIVITAMIN WITH MINERALS) TABS tablet Take 1 tablet by mouth daily. Centrum    . nitroGLYCERIN (NITROSTAT) 0.4 MG SL tablet PLACE 1 TABLET (0.4 MG TOTAL) UNDER THE TONGUE EVERY 5 (FIVE) MINUTES X 3 DOSES AS NEEDED FOR CHEST PAIN. 75 tablet 2  . omeprazole (PRILOSEC) 20 MG capsule Take 20 mg by mouth daily before breakfast.     . Polyethyl Glycol-Propyl Glycol (SYSTANE) 0.4-0.3 % SOLN Place 1 drop into both eyes 2 (two) times daily as needed (dry/irritated eyes.).    [name] torsemide (DEMADEX) 20 MG tablet Take 2 tablets (40 mg total) by mouth 2 (two) times daily. 360 tablet 3  . valACYclovir (VALTREX) 1000 MG tablet Take 1 tablet (1,000 mg total) by mouth every 12 (twelve) hours as needed (cold sores). (Patient taking differently: Take 2 g by mouth every 12 (twelve) hours as needed (fever blisters/cold sores.). )    . vitamin B-12 (CYANOCOBALAMIN) 1000 MCG tablet Take 1,000 mcg by mouth daily.    . vitamin E 1000 UNIT capsule Take 1,000 Units by mouth at bedtime.    . dronedarone (MULTAQ) 400 MG tablet Take 1 tablet (400 mg total) by mouth 2 (two) times daily with a meal. (Patient not taking: Reported on 09/15/2019) 180 tablet 3   No current facility-administered medications for this visit.    Allergies:   Adhesive [tape]   Social History:  The patient  reports that she quit smoking about 37 years ago. Her smoking use included cigarettes. She started smoking about 41 years ago. She has a 5.00 pack-year smoking history. She has never used smokeless tobacco. She reports current alcohol use. She reports that she does not use drugs.   Family History:  The patient's family history includes Arrhythmia in her mother; Colon cancer in her maternal aunt; Dementia in her mother; Diabetes in her mother and paternal grandfather; Heart attack in her brother; Heart disease in her paternal aunt; Hypertension in her mother; Prostate cancer in her maternal grandfather; Suicidality in her father.   ROS:  Please  see the history of present illness.   Otherwise, review of systems is positive for none.   All other systems are reviewed and negative.   PHYSICAL EXAM: VS:  BP (!) 142/76   Pulse 75   Ht 5\' 5"  (1.651 m)   Wt 272 lb (123.4 kg)   BMI 45.26 kg/m  , BMI Body mass index is 45.26 kg/m. GEN: Well nourished, well developed, in no  acute distress  HEENT: normal  Neck: no JVD, carotid bruits, or masses Cardiac: RRR; no murmurs, rubs, or gallops,no edema  Respiratory:  clear to auscultation bilaterally, normal work of breathing GI: soft, nontender, nondistended, + BS MS: no deformity or atrophy  Skin: warm and dry, device site well healed Neuro:  Strength and sensation are intact Psych: euthymic mood, full affect  EKG:  EKG is ordered today. Personal review of the ekg ordered shows sinus rhythm, rate 75  Personal review of the device interrogation today. Results in [place]: 04/10/2019: ALT 40; TSH 3.48 06/25/2019: BUN 17; Creatinine, Ser 1.03; Hemoglobin 15.1; Platelets 302; Potassium 4.5; Sodium 140    Lipid Panel     Component Value Date/Time   CHOL 151 04/10/2019 0801   CHOL 116 10/16/2016 0850   TRIG 160.0 (H) 04/10/2019 0801   HDL 50.80 04/10/2019 0801   HDL 42 10/16/2016 0850   CHOLHDL 3 04/10/2019 0801   VLDL 32.0 04/10/2019 0801   LDLCALC 68 04/10/2019 0801   LDLCALC 46 10/16/2016 0850     Wt Readings from Last 3 Encounters:  09/15/19 272 lb (123.4 kg)  07/11/19 269 lb (122 kg)  06/25/19 270 lb (122.5 kg)      Other studies Reviewed: Additional studies/ records that were reviewed today include: TTE 03/28/16  Review of the above records today demonstrates:  - Left ventricle: The cavity size was normal. Wall thickness was   increased in a pattern of mild LVH. Systolic function was normal.   The estimated ejection fraction was in the range of 55% to 60%.   Indeterminant diastolic function (atrial fibrillation). Wall   motion was normal; there were no  regional wall motion   abnormalities. - Aortic valve: There was no stenosis. - Mitral valve: Mildly calcified annulus. There was no significant   regurgitation. - Left atrium: The atrium was mildly dilated. - Right ventricle: The cavity size was normal. Pacer wire or   catheter noted in right ventricle. Systolic function was normal. - Tricuspid valve: Peak RV-RA gradient (S): 22 mm Hg. - Pulmonary arteries: PA peak pressure: 30 mm Hg (S). - Systemic veins: IVC measured 2.3 cm with normal respirophasic   variation, suggesting RA pressure 8 mmHg.  LHC/RHC 05/09/17 1.  Single-vessel coronary artery disease with continued patency of the stented segment in the right coronary artery and chronic occlusion of a small acute marginal branch collateralized by the left coronary artery 2.  Patent left main, LAD, and left circumflex without significant stenosis 3.  Vigorous LV systolic function with mildly elevated LVEDP 4.  Severe pulmonary hypertension with mean pulmonary artery pressure of 48 mmHg.  Because of high cardiac output (8.5 L/min), the patient's pulmonary vascular resistance is only mildly increased at 3 Wood units.  ASSESSMENT AND PLAN:  1.  Persistent atrial fibrillation/flutter: Status post AF ablation x3, most recently 03/14/2019.  Currently on Eliquis with a CHA2DS2-VASc of 3.  She continues to have atrial arrhythmias unfortunately.  She is remained in sinus rhythm.  We Erron Wengert make no changes.  2. Hypertension: Mildly elevated today.  Usually much better controlled.  No changes.  3. Obesity: Diet and weight loss encouraged  4. Chronic diastolic heart failure: No signs of volume overload  5. Obstructive sleep apnea: CPAP compliance encouraged  6.  Sinus node dysfunction: Status post Saint Jude dual-chamber pacemaker.  Bipolar impedance has been high and she is now switched to unipolar.  We Abdulai Blaylock make no further changes.  Current medicines are reviewed at length with the patient  today.   The patient does not have concerns regarding her medicines.  The following changes were made today: None  Labs/ tests ordered today include:  Orders Placed This Encounter  Procedures  . EKG 12-Lead     Disposition:   FU with Joletta Manner 6 months  Signed, Grier Vu Meredith Leeds, MD  09/15/2019 3:52 PM     [address] [address] [phone] (office) [phone] (fax)

## 2019-09-16 ENCOUNTER — Other Ambulatory Visit: Payer: Self-pay | Admitting: Cardiology

## 2019-10-08 DIAGNOSIS — L820 Inflamed seborrheic keratosis: Secondary | ICD-10-CM | POA: Diagnosis not present

## 2019-10-08 DIAGNOSIS — Z85828 Personal history of other malignant neoplasm of skin: Secondary | ICD-10-CM | POA: Diagnosis not present

## 2019-10-08 DIAGNOSIS — L821 Other seborrheic keratosis: Secondary | ICD-10-CM | POA: Diagnosis not present

## 2019-10-08 DIAGNOSIS — D1801 Hemangioma of skin and subcutaneous tissue: Secondary | ICD-10-CM | POA: Diagnosis not present

## 2019-10-08 DIAGNOSIS — L812 Freckles: Secondary | ICD-10-CM | POA: Diagnosis not present

## 2019-10-08 DIAGNOSIS — L817 Pigmented purpuric dermatosis: Secondary | ICD-10-CM | POA: Diagnosis not present

## 2019-10-14 ENCOUNTER — Other Ambulatory Visit: Payer: Self-pay | Admitting: Internal Medicine

## 2019-10-16 ENCOUNTER — Other Ambulatory Visit: Payer: Self-pay | Admitting: Internal Medicine

## 2019-10-16 ENCOUNTER — Other Ambulatory Visit: Payer: Self-pay | Admitting: Cardiology

## 2019-10-17 NOTE — Telephone Encounter (Signed)
Last OV 01/06/2019  Last filled by you on 02/12/18, # 30 with 0 refills  OK to send?

## 2019-10-20 ENCOUNTER — Other Ambulatory Visit: Payer: Self-pay | Admitting: Cardiology

## 2019-10-20 NOTE — Telephone Encounter (Signed)
Prescription refill request for Eliquis received.  Last office visit: [place], 06/17/2019 Scr: 1.03, 06/25/2019 Age: 74 y.o. Weight: 123.4 kg   Prescription refill sent.

## 2019-10-21 ENCOUNTER — Telehealth: Payer: Self-pay | Admitting: Internal Medicine

## 2019-10-21 DIAGNOSIS — I495 Sick sinus syndrome: Secondary | ICD-10-CM

## 2019-10-21 DIAGNOSIS — I10 Essential (primary) hypertension: Secondary | ICD-10-CM

## 2019-10-21 NOTE — Chronic Care Management (AMB) (Signed)
  Chronic Care Management   Note  10/21/2019 Name: [name] MRN: [id number] DOB: [birth year]  [name] is a 74 y.o. year old female who is a primary care patient of [name], [name], MD. I reached out to [place] by phone today in response to a referral sent by [name]'s PCP, [name], [name], MD.   [name] was given information about Chronic Care Management services today including:  1. CCM service includes personalized support from designated clinical staff supervised by her physician, including individualized plan of care and coordination with other care providers 2. 24/7 contact phone numbers for assistance for urgent and routine care needs. 3. Service will only be billed when office clinical staff spend 20 minutes or more in a month to coordinate care. 4. Only one practitioner may furnish and bill the service in a calendar month. 5. The patient may stop CCM services at any time (effective at the end of the month) by phone call to the office staff.   Patient agreed to services and verbal consent obtained.   Follow up plan:   Raynicia Dukes [employer] Scheduler

## 2019-10-23 DIAGNOSIS — G4733 Obstructive sleep apnea (adult) (pediatric): Secondary | ICD-10-CM | POA: Diagnosis not present

## 2019-11-14 NOTE — Addendum Note (Signed)
Addended by: Gwenyth Ober R on: 11/14/2019 03:39 PM   Modules accepted: Orders

## 2019-11-14 NOTE — Telephone Encounter (Signed)
Done

## 2019-11-17 ENCOUNTER — Other Ambulatory Visit: Payer: Self-pay

## 2019-11-17 ENCOUNTER — Ambulatory Visit: Payer: PPO

## 2019-11-17 DIAGNOSIS — I10 Essential (primary) hypertension: Secondary | ICD-10-CM

## 2019-11-17 DIAGNOSIS — E785 Hyperlipidemia, unspecified: Secondary | ICD-10-CM

## 2019-11-17 DIAGNOSIS — R7303 Prediabetes: Secondary | ICD-10-CM

## 2019-11-17 DIAGNOSIS — Z9861 Coronary angioplasty status: Secondary | ICD-10-CM

## 2019-11-17 DIAGNOSIS — I2510 Atherosclerotic heart disease of native coronary artery without angina pectoris: Secondary | ICD-10-CM

## 2019-11-17 DIAGNOSIS — I480 Paroxysmal atrial fibrillation: Secondary | ICD-10-CM

## 2019-11-17 DIAGNOSIS — Z6379 Other stressful life events affecting family and household: Secondary | ICD-10-CM

## 2019-11-17 DIAGNOSIS — I5033 Acute on chronic diastolic (congestive) heart failure: Secondary | ICD-10-CM

## 2019-11-17 NOTE — Chronic Care Management (AMB) (Signed)
Chronic Care Management Pharmacy  Name: [name]  MRN: [id number] DOB: 1946/03/25  Initial Questions: 1. Have you seen any other providers since your last visit? NA 2. Any changes in your medicines or health? No   Chief Complaint/ HPI  [name],  74 y.o. , female presents for their Initial CCM visit with the clinical pharmacist via telephone due to COVID-19 Pandemic.  Patient reports doing well since pandemics has all vaccines.  Mother passed away in Mar 09, 2023.   PCP : [name], MD  Their chronic conditions include: Afib, prediabetes, HF, HTN, HLD, CAD, stress, allergic rhinitis, GERD  Office Visits: 01/06/2019- Patient presented for follow up for chronic disease management. Labs ordered: BMET, CBC, A1c, hepatic function panel, TSH, lipid panel. Plan for follow up in 6 months.  Consult Visit: 09/15/2019- Cardiology (Electrophysiology)- Will Meredith Leeds, MD- patient presented for electrophysiology evaluation. Patient continues to have atrial arrhthmias and is in sinus rhythm. No changes. Patient to continue on Eliquis. Follow up in 6 months.   07/11/2019- Cardiology- Mertie Moores, MD- patient presented for follow up for HF and afib. Patient presented as stable for HF. Patient to advised of diet, salt control, and weight loss. For Afib, patient did not want to start Multaq. Patient to discuss with Dr. Curt Bears.   06/17/2019- Cardiology (Electrophysiology)- Will Meredith Leeds, MD- patient presented for electrophysiology evaluation. Patient on Eliquis and diltiazem for persistent atrial fibrillation/ flutter. Plan to start patient on sotalol and plan for cardioversion. Follow up in 3 months.   Medications: Outpatient Encounter Medications as of 11/17/2019  Medication Sig  . acetaminophen (TYLENOL) 500 MG tablet Take 1,000 mg by mouth daily.   . Ascorbic Acid (VITAMIN C) 1000 MG tablet Take 1,000 mg by mouth daily.   [name] atorvastatin (LIPITOR) 40 MG tablet TAKE 1 TABLET BY  MOUTH EVERY DAY  . Calcium Carbonate-Vitamin D (CALCIUM 600+D PO) Take 1 tablet by mouth at bedtime.   . Cholecalciferol (VITAMIN D3) 125 MCG (5000 UT) TABS Take 5,000 Units by mouth every evening.  . diltiazem (CARDIZEM CD) 240 MG 24 hr capsule TAKE 1 CAPSULE BY MOUTH EVERY DAY **DOSE INCREASE**  . diphenhydramine-acetaminophen (TYLENOL PM) 25-500 MG TABS tablet Take 2 tablets by mouth at bedtime.   [name] ELIQUIS 5 MG TABS tablet TAKE 1 TABLET BY MOUTH TWICE A DAY  . FLUoxetine (PROZAC) 20 MG capsule TAKE 1 CAPSULE BY MOUTH EVERY DAY  . fluticasone (FLONASE) 50 MCG/ACT nasal spray Place 1 spray into both nostrils daily.   [name] KLOR-CON M20 20 MEQ tablet TAKE 1 TABLET BY MOUTH 2 TIMES DAILY. PLEASE KEEP UPCOMING APPT IN JANUARY WITH DR. Acie Fredrickson  . loratadine (CLARITIN) 10 MG tablet TAKE 1 TABLET BY MOUTH EVERY DAY  . Lysine 1000 MG TABS Take 1,000 mg by mouth at bedtime.   . Multiple Vitamin (MULTIVITAMIN WITH MINERALS) TABS tablet Take 1 tablet by mouth daily. Centrum  . omeprazole (PRILOSEC) 20 MG capsule Take 20 mg by mouth daily before breakfast.   . Polyethyl Glycol-Propyl Glycol (SYSTANE) 0.4-0.3 % SOLN Place 1 drop into both eyes 2 (two) times daily as needed (dry/irritated eyes.).  [name] torsemide (DEMADEX) 20 MG tablet Take 2 tablets (40 mg total) by mouth 2 (two) times daily.  . valACYclovir (VALTREX) 1000 MG tablet Take 2 tablets (2,000 mg total) by mouth every 12 (twelve) hours as needed (fever blisters/cold sores.).  [name] vitamin B-12 (CYANOCOBALAMIN) 1000 MCG tablet Take 2,000 mcg by mouth daily.   [name]  vitamin E 1000 UNIT capsule Take 1,000 Units by mouth at bedtime.  . [DISCONTINUED] nitroGLYCERIN (NITROSTAT) 0.4 MG SL tablet PLACE 1 TABLET (0.4 MG TOTAL) UNDER THE TONGUE EVERY 5 (FIVE) MINUTES X 3 DOSES AS NEEDED FOR CHEST PAIN.  [name] dronedarone (MULTAQ) 400 MG tablet Take 1 tablet (400 mg total) by mouth 2 (two) times daily with a meal. (Patient not taking: Reported on 09/15/2019)   No  facility-administered encounter medications on file as of 11/17/2019.     Current Diagnosis/Assessment:  Goals Addressed            This Visit's Progress   . Pharmacy Care Plan       CARE PLAN ENTRY  Current Barriers:  . Chronic Disease Management support, education, and care coordination needs related to Hypertension, Hyperlipidemia, Atrial Fibrillation, Heart Failure, Coronary Artery Disease, Prediabetes, and stress   Hypertension . Pharmacist Clinical Goal(s): o Over the next 90 days, patient will work with PharmD and providers to maintain BP goal <130/80 . Current regimen:  o Diltiazem (Cardizem CD) 240mg , 1 capsule once daily  o Eliquis 5mg , 1 tablet twice daily (for blood clot prevention) . Patient self care activities - Over the next 90 days, patient will: o Check BP daily, document, and provide at future appointments o Ensure daily salt intake < 1500 mg/day  Hyperlipidemia/ coronary artery disease . Pharmacist Clinical Goal(s): o Over the next 90 days, patient will work with PharmD and providers to maintain LDL goal < 70 . Current regimen:  o Atorvastatin 40mg , 1 tablet once daily  o Nitroglycerin 0.4mg  SL tablet, place 1 tablet under tongue every five minutes for 3 doses as needed for chest pain  . Patient self care activities - Over the next 90 days, patient will: o Continue current medications  Prediabetes . Pharmacist Clinical Goal(s): o Over the next 90 days, patient will work with PharmD and providers to maintain A1c goal <7% . Current regimen:  o No medications. Lifestyle modifications (diet/exercise)  . Patient self care activities - Over the next 90 days, patient will: o Continue working on diet and exercise modifications.   Atrial fibrillation . Pharmacist Clinical Goal(s) o Over the next 90 days, patient will work with PharmD and providers to maintain normal sinus rhythm.  . Current regimen:  o Diltiazem (Cardizem CD) 240mg , 1 capsule once daily   . Interventions: o We discussed:  monitoring for signs and symptoms for bleeding (coughing up blood, prolonged nose bleeds, black, tarry stools). . Patient self care activities - Over the next 90 days, patient will: o Continue current medications.   Heart failure  . Pharmacist Clinical Goal(s) o Over the next 90 days, patient will work with PharmD and providers to Check weight daily and contact physician if weight gain of more than 3 pounds overnight or more than 5 pounds in a week . Current regimen:  o Torsemide 20mg , 2 tablets twice daily  . Interventions: o We discussed Check weight daily and contact physician if weight gain of more than 3 pounds overnight or more than 5 pounds in a week . Patient self care activities o Patient will continue current medications.   Stress . Pharmacist Clinical Goal(s) o Over the next 90 days, patient will work with PharmD and providers to minimize stress/ anxiety symptoms.  . Current regimen:  o Fluoxetine 20mg ,1 capsule once daily . Interventions: o Recommend increasing to 40mg  once daily  . Patient self care activities o Patient will continue medications as directed.  Medication management . Pharmacist Clinical Goal(s): o Over the next 90 days, patient will work with PharmD and providers to maintain optimal medication adherence . Current pharmacy: [facility] . Interventions o Comprehensive medication review performed. o Continue current medication management strategy . Patient self care activities - Over the next 90 days, patient will: o Take medications as prescribed o Report any questions or concerns to PharmD and/or provider(s)  Please see past updates related to this goal by clicking on the "Past Updates" button in the selected goal        SDOH Interventions     Most Recent Value  SDOH Interventions  Transportation Interventions  Intervention Not Indicated       AFIB   Patient is currently rate controlled.   Patient has failed  these meds in past: dronedarone (patient reported retaining fluid), amiodarone, dofetilide, sotalol   Patient is currently controlled on the following medications:   Diltiazem (Cardizem CD) 240mg , 1 capsule once daily   Anticoagulation  Eliquis 5mg , 1 tablet twice daily    We discussed:  monitoring for signs and symptoms for bleeding (coughing up blood, prolonged nose bleeds, black, tarry stools).  Plan Continue current medications  Prediabetes (query)  Patient reports walking everyday. With HF, hard to get out walking. Slowly building up walking. Has limited bread and limited dessert. Ice cream.  Does not eat pastas or potatoes. But eats sweet potatoes.  Eats more veggies, chicken and pork.   Patient reports she was told she was prediabetes.   Recent Relevant Labs: Lab Results  Component Value Date/Time   HGBA1C 6.9 (H) 04/10/2019 08:01 AM   HGBA1C 6.1 (A) 09/04/2018 10:15 AM   HGBA1C 6.0 (H) 06/01/2018 01:38 AM   MICROALBUR <0.7 01/23/2017 10:51 AM    Patient has failed these meds in past: none  Patient is currently controlled on the following medications:  - no medications.   We discussed: diet and exercise extensively  Plan Recommended updating blood and visit with Dr. [name].  Continue current medications   Heart Failure  Patient reports still having some problems especially when not watching her diet. Aims to elevate her feet. Watches her salt.   Type: Diastolic  Last ejection fraction: 55% - 60% (06/01/2018)   NYHA Class: III (marked limitation of activity)  Patient has failed these meds in past:  Patient is currently controlled on the following medications:   Torsemide 20mg , 2 tablets twice daily   Potassium: 4.5 (06/25/2019)  Klor-con 35mEq, 1 tablet twice daily   We discussed Check weight daily and contact physician if weight gain of more than 3 pounds overnight or more than 5 pounds in a week.  Plan Continue current medications  Hypertension    Denies persistent dizziness/ lightheadedness. Endorses some orthostatic hypotension.   Office blood pressures are  BP Readings from Last 3 Encounters:  09/15/19 (!) 142/76  07/11/19 (!) 142/82  06/17/19 116/72   Patient has failed these meds in the past: indapamide, losartan, metoprolol,   Patient checks BP at home daily  Patient home BP readings are ranging: 120/26mmHg   Patient is controlled (based on reported home BP readings) on:   Diltiazem (Cardizem CD) 240mg , 1 capsule once daily   We discussed  - caution in fall risk precautions (take time getting up and wait a few seconds before taking first step).   Plan Continue current medications   Hyperlipidemia   Lipid Panel     Component Value Date/Time   CHOL 151 04/10/2019 0801  CHOL 116 10/16/2016 0850   TRIG 160.0 (H) 04/10/2019 0801   HDL 50.80 04/10/2019 0801   HDL 42 10/16/2016 0850   CHOLHDL 3 04/10/2019 0801   VLDL 32.0 04/10/2019 0801   LDLCALC 68 04/10/2019 0801   LDLCALC 46 10/16/2016 0850   LABVLDL 28 10/16/2016 0850     The ASCVD Risk score (Goff DC Jr., et al., 2013) failed to calculate for the following reasons:   The patient has a prior MI or stroke diagnosis   Patient is currently controlled on the following medications:   Atorvastatin 40mg , 1 tablet once daily   Plan Continue current medications  CAD   Reports not needing nitro in "quite a while" and makes sure she checks expiration date.   Patient is currently controlled on the following medications:   Atorvastatin 40mg , 1 tablet daily   Nitroglycerin 0.4mg  SL tablet, place 1 tablet under tongue every five minutes for 3 doses as needed for chest pain   Plan Continue current medications   Stress due to illness of family member   Patient reports having a nervous spell "once in awhile:. She reports her husband was in hospital and really upset her. Patient inquired on having another medication as needed use or increasing dose of  fluoxetine. .  Patient has failed these meds in past: none  Patient is currently uncontrolled on the following medications:   Fluoxetine 20mg ,1 capsule once daily  We discussed: - caution use of as needed medications (such as benzos)  Plan Consulting with Dr. [name] on dose increase of fluoxetine to 40mg  once daily.   Allergic rhinitis    Patient is currently controlled on the following medications:  Fluticasone (Flonase) 74mcg/ act nasal spray, 1 spray into both nostrils daily   Loratadine 10mg ,1 tablet once daily  Plan  Continue current medications  GERD   Denies symtpoms and keeps in control. Not interested in discontinuing.   Patient is currently controlled on the following medications:   Omeprazole 20mg , 1 capsule once daily before breakfast   Plan Continue current medications  Bone Health   Last DEXA Scan: 04/08/2014  T-Score forearm radius: 0.895   10-year probability of major osteoporotic fracture: 5.9%   10-year probability of hip fracture: 0.2%  VITD  Date Value Ref Range Status  01/23/2017 32.83 30.00 - 100.00 ng/mL Final     Patient is not a candidate for pharmacologic treatment  Patient is currently controlled on the following medications:   Calcium carbonate/ vitamin D, 1 tablet at bedtime  Cholecalciferol (158mcg)  5000 units, 1 tablet every evening  We discussed:  Recommend [phone] units of vitamin D daily. Recommend 1200 mg of calcium daily from dietary and supplemental sources.  Plan Recommend DEXA scan Continue current medications   OTC/ supplements     Ascorbic acid 1000mg , 1 tablet once daily  APAP 500mg , 2 in the AM, and 2 in the PM   Diphenhydramine/ APAP, 25-500mg , 2 tablets at bedtime   Lysine 1000mg , 1 tablet at bedtime   Multivitamin, 1 tablet once daily   Systane, 1 drop into both eyes twice daily as needed  Valacyclovir 1000mg , 2 tablet every twelve hours as needed for fever blisters/ cold sores   Vitamin  B12 1080mcg, 1 tablet once daily  Vitamin E (670mg ) 1000 units, 1 capsule at bedtime   Medication Management  Patient organizes medications: patient uses weekly pill box for 2 weeks.  Phone set for reminders for AM and PM medications.  Primary [facility]: [facility]  Adherence: no gaps in refill history (per medication dispense history from 05/21/19 to 11/17/19)     Follow up  Follow up visit with PharmD in September.  Consulting with Dr. [name] on dose increase for fluoxetine.    Anson Crofts, PharmD Clinical Pharmacist [facility] [phone]

## 2019-11-20 ENCOUNTER — Other Ambulatory Visit: Payer: Self-pay | Admitting: Cardiovascular Disease

## 2019-11-29 NOTE — Patient Instructions (Addendum)
Visit Information  Goals Addressed            This Visit's Progress   . Pharmacy Care Plan       CARE PLAN ENTRY  Current Barriers:  . Chronic Disease Management support, education, and care coordination needs related to Hypertension, Hyperlipidemia, Atrial Fibrillation, Heart Failure, Coronary Artery Disease, Prediabetes, and stress   Hypertension . Pharmacist Clinical Goal(s): o Over the next 90 days, patient will work with PharmD and providers to maintain BP goal <130/80 . Current regimen:  o Diltiazem (Cardizem CD) 240mg , 1 capsule once daily  o Eliquis 5mg , 1 tablet twice daily (for blood clot prevention) . Patient self care activities - Over the next 90 days, patient will: o Check BP daily, document, and provide at future appointments o Ensure daily salt intake < 1500 mg/day  Hyperlipidemia/ coronary artery disease . Pharmacist Clinical Goal(s): o Over the next 90 days, patient will work with PharmD and providers to maintain LDL goal < 70 . Current regimen:  o Atorvastatin 40mg , 1 tablet once daily  o Nitroglycerin 0.4mg  SL tablet, place 1 tablet under tongue every five minutes for 3 doses as needed for chest pain  . Patient self care activities - Over the next 90 days, patient will: o Continue current medications  Prediabetes . Pharmacist Clinical Goal(s): o Over the next 90 days, patient will work with PharmD and providers to maintain A1c goal <7% . Current regimen:  o No medications. Lifestyle modifications (diet/exercise)  . Patient self care activities - Over the next 90 days, patient will: o Continue working on diet and exercise modifications.   Atrial fibrillation . Pharmacist Clinical Goal(s) o Over the next 90 days, patient will work with PharmD and providers to maintain normal sinus rhythm.  . Current regimen:  o Diltiazem (Cardizem CD) 240mg , 1 capsule once daily  . Interventions: o We discussed:  monitoring for signs and symptoms for bleeding (coughing  up blood, prolonged nose bleeds, black, tarry stools). . Patient self care activities - Over the next 90 days, patient will: o Continue current medications.   Heart failure  . Pharmacist Clinical Goal(s) o Over the next 90 days, patient will work with PharmD and providers to Check weight daily and contact physician if weight gain of more than 3 pounds overnight or more than 5 pounds in a week . Current regimen:  o Torsemide 20mg , 2 tablets twice daily  . Interventions: o We discussed Check weight daily and contact physician if weight gain of more than 3 pounds overnight or more than 5 pounds in a week . Patient self care activities o Patient will continue current medications.   Stress . Pharmacist Clinical Goal(s) o Over the next 90 days, patient will work with PharmD and providers to minimize stress/ anxiety symptoms.  . Current regimen:  o Fluoxetine 20mg ,1 capsule once daily . Interventions: o Recommend increasing to 40mg  once daily  . Patient self care activities o Patient will continue medications as directed.   Medication management . Pharmacist Clinical Goal(s): o Over the next 90 days, patient will work with PharmD and providers to maintain optimal medication adherence . Current [facility]: [facility] . Interventions o Comprehensive medication review performed. o Continue current medication management strategy . Patient self care activities - Over the next 90 days, patient will: o Take medications as prescribed o Report any questions or concerns to PharmD and/or provider(s)  Please see past updates related to this goal by clicking on the "Past Updates"  button in the selected goal         [name] was given information about Chronic Care Management services today including:  1. CCM service includes personalized support from designated clinical staff supervised by her physician, including individualized plan of care and coordination with other care providers 2. 24/7 contact  phone numbers for assistance for urgent and routine care needs. 3. Standard insurance, coinsurance, copays and deductibles apply for chronic care management only during months in which we provide at least 20 minutes of these services. Most insurances cover these services at 100%, however patients may be responsible for any copay, coinsurance and/or deductible if applicable. This service may help you avoid the need for more expensive face-to-face services. 4. Only one practitioner may furnish and bill the service in a calendar month. 5. The patient may stop CCM services at any time (effective at the end of the month) by phone call to the office staff.  Patient agreed to services and verbal consent obtained.   The patient verbalized understanding of instructions provided today and agreed to receive a mailed copy of patient instruction and/or educational materials. Telephone follow up appointment with pharmacy team member scheduled for: 03/18/2020  Anson Crofts, PharmD Clinical Pharmacist [facility] [phone]   Mindfulness-Based Stress Reduction Mindfulness-based stress reduction (MBSR) is a program that helps people learn to practice mindfulness. Mindfulness is the practice of intentionally paying attention to the present moment. It can be learned and practiced through techniques such as education, breathing exercises, meditation, and yoga. MBSR includes several mindfulness techniques in one program. MBSR works best when you understand the treatment, are willing to try new things, and can commit to spending time practicing what you learn. MBSR training may include learning about:  How your emotions, thoughts, and reactions affect your body.  New ways to respond to things that cause negative thoughts to start (triggers).  How to notice your thoughts and let go of them.  Practicing awareness of everyday things that you normally do without thinking.  The  techniques and goals of different types of meditation. What are the benefits of MBSR? MBSR can have many benefits, which include helping you to:  Develop self-awareness. This refers to knowing and understanding yourself.  Learn skills and attitudes that help you to participate in your own health care.  Learn new ways to care for yourself.  Be more accepting about how things are, and let things go.  Be less judgmental and approach things with an open mind.  Be patient with yourself and trust yourself more. MBSR has also been shown to:  Reduce negative emotions, such as depression and anxiety.  Improve memory and focus.  Change how you sense and approach pain.  Boost your body's ability to fight infections.  Help you connect better with other people.  Improve your sense of well-being. Follow these instructions at home:   Find a local in-person or online MBSR program.  Set aside some time regularly for mindfulness practice.  Find a mindfulness practice that works best for you. This may include one or more of the following: ? Meditation. Meditation involves focusing your mind on a certain thought or activity. ? Breathing awareness exercises. These help you to stay present by focusing on your breath. ? Body scan. For this practice, you lie down and pay attention to each part of your body from head to toe. You can identify tension and soreness and intentionally relax parts of your body. ?  Yoga. Yoga involves stretching and breathing, and it can improve your ability to move and be flexible. It can also provide an experience of testing your body's limits, which can help you release stress. ? Mindful eating. This way of eating involves focusing on the taste, texture, color, and smell of each bite of food. Because this slows down eating and helps you feel full sooner, it can be an important part of a weight-loss plan.  Find a podcast or recording that provides guidance for breathing  awareness, body scan, or meditation exercises. You can listen to these any time when you have a free moment to rest without distractions.  Follow your treatment plan as told by your health care provider. This may include taking regular medicines and making changes to your diet or lifestyle as recommended. How to practice mindfulness To do a basic awareness exercise:  Find a comfortable place to sit.  Pay attention to the present moment. Observe your thoughts, feelings, and surroundings just as they are.  Avoid placing judgment on yourself, your feelings, or your surroundings. Make note of any judgment that comes up, and let it go.  Your mind may wander, and that is okay. Make note of when your thoughts drift, and return your attention to the present moment. To do basic mindfulness meditation:  Find a comfortable place to sit. This may include a stable chair or a firm floor cushion. ? Sit upright with your back straight. Let your arms fall next to your side with your hands resting on your legs. ? If sitting in a chair, rest your feet flat on the floor. ? If sitting on a cushion, cross your legs in front of you.  Keep your head in a neutral position with your chin dropped slightly. Relax your jaw and rest the tip of your tongue on the roof of your mouth. Drop your gaze to the floor. You can close your eyes if you like.  Breathe normally and pay attention to your breath. Feel the air moving in and out of your nose. Feel your belly expanding and relaxing with each breath.  Your mind may wander, and that is okay. Make note of when your thoughts drift, and return your attention to your breath.  Avoid placing judgment on yourself, your feelings, or your surroundings. Make note of any judgment or feelings that come up, let them go, and bring your attention back to your breath.  When you are ready, lift your gaze or open your eyes. Pay attention to how your body feels after the meditation. Where  to find more information You can find more information about MBSR from:  Your health care provider.  Community-based meditation centers or programs.  Programs offered near you. Summary  Mindfulness-based stress reduction (MBSR) is a program that teaches you how to intentionally pay attention to the present moment. It is used with other treatments to help you cope better with daily stress, emotions, and pain.  MBSR focuses on developing self-awareness, which allows you to respond to life stress without judgment or negative emotions.  MBSR programs may involve learning different mindfulness practices, such as breathing exercises, meditation, yoga, body scan, or mindful eating. Find a mindfulness practice that works best for you, and set aside time for it on a regular basis. This information is not intended to replace advice given to you by your health care provider. Make sure you discuss any questions you have with your health care provider. Document Revised: 06/01/2017 Document Reviewed: 10/26/2016  Elsevier Patient Education  [employer].

## 2019-12-23 ENCOUNTER — Telehealth: Payer: Self-pay | Admitting: Internal Medicine

## 2019-12-23 NOTE — Telephone Encounter (Signed)
Form placed in red folder. Please return once complete. Thank you!

## 2019-12-23 NOTE — Telephone Encounter (Signed)
Pt has a disability parking placard renewal form needing filled out. Pt is aware to allow 3-5 business days to complete and there may be a fee.   When completed, pt would like a call at [phone] to pick up the form.   Placed in red folder

## 2019-12-29 ENCOUNTER — Other Ambulatory Visit: Payer: Self-pay | Admitting: Internal Medicine

## 2019-12-31 NOTE — Telephone Encounter (Signed)
Called patient and let her know her form is ready for pick up at the front desk.

## 2020-01-12 ENCOUNTER — Other Ambulatory Visit: Payer: Self-pay | Admitting: Cardiovascular Disease

## 2020-01-16 ENCOUNTER — Telehealth: Payer: Self-pay | Admitting: Cardiology

## 2020-01-16 ENCOUNTER — Ambulatory Visit (HOSPITAL_COMMUNITY)
Admission: RE | Admit: 2020-01-16 | Discharge: 2020-01-16 | Disposition: A | Discharge status: Home / Self Care | Payer: PPO | Source: Ambulatory Visit | Attending: Nurse Practitioner | Admitting: Nurse Practitioner

## 2020-01-16 ENCOUNTER — Encounter (HOSPITAL_COMMUNITY): Payer: Self-pay | Admitting: Nurse Practitioner

## 2020-01-16 ENCOUNTER — Other Ambulatory Visit: Payer: Self-pay

## 2020-01-16 VITALS — BP 146/88 | HR 114 | Ht 65.0 in | Wt 275.4 lb

## 2020-01-16 DIAGNOSIS — Z8673 Personal history of transient ischemic attack (TIA), and cerebral infarction without residual deficits: Secondary | ICD-10-CM | POA: Diagnosis not present

## 2020-01-16 DIAGNOSIS — G4733 Obstructive sleep apnea (adult) (pediatric): Secondary | ICD-10-CM | POA: Diagnosis not present

## 2020-01-16 DIAGNOSIS — Z7901 Long term (current) use of anticoagulants: Secondary | ICD-10-CM | POA: Diagnosis not present

## 2020-01-16 DIAGNOSIS — Z950 Presence of cardiac pacemaker: Secondary | ICD-10-CM | POA: Insufficient documentation

## 2020-01-16 DIAGNOSIS — E785 Hyperlipidemia, unspecified: Secondary | ICD-10-CM | POA: Diagnosis not present

## 2020-01-16 DIAGNOSIS — Z8249 Family history of ischemic heart disease and other diseases of the circulatory system: Secondary | ICD-10-CM | POA: Diagnosis not present

## 2020-01-16 DIAGNOSIS — F329 Major depressive disorder, single episode, unspecified: Secondary | ICD-10-CM | POA: Diagnosis not present

## 2020-01-16 DIAGNOSIS — I130 Hypertensive heart and chronic kidney disease with heart failure and stage 1 through stage 4 chronic kidney disease, or unspecified chronic kidney disease: Secondary | ICD-10-CM | POA: Insufficient documentation

## 2020-01-16 DIAGNOSIS — Z79899 Other long term (current) drug therapy: Secondary | ICD-10-CM | POA: Diagnosis not present

## 2020-01-16 DIAGNOSIS — D6869 Other thrombophilia: Secondary | ICD-10-CM | POA: Diagnosis not present

## 2020-01-16 DIAGNOSIS — I272 Pulmonary hypertension, unspecified: Secondary | ICD-10-CM | POA: Insufficient documentation

## 2020-01-16 DIAGNOSIS — M479 Spondylosis, unspecified: Secondary | ICD-10-CM | POA: Diagnosis not present

## 2020-01-16 DIAGNOSIS — M4310 Spondylolisthesis, site unspecified: Secondary | ICD-10-CM | POA: Diagnosis not present

## 2020-01-16 DIAGNOSIS — Z85828 Personal history of other malignant neoplasm of skin: Secondary | ICD-10-CM | POA: Diagnosis not present

## 2020-01-16 DIAGNOSIS — M19049 Primary osteoarthritis, unspecified hand: Secondary | ICD-10-CM | POA: Diagnosis not present

## 2020-01-16 DIAGNOSIS — I484 Atypical atrial flutter: Secondary | ICD-10-CM | POA: Diagnosis not present

## 2020-01-16 DIAGNOSIS — Z96652 Presence of left artificial knee joint: Secondary | ICD-10-CM | POA: Insufficient documentation

## 2020-01-16 DIAGNOSIS — N189 Chronic kidney disease, unspecified: Secondary | ICD-10-CM | POA: Insufficient documentation

## 2020-01-16 DIAGNOSIS — Z87891 Personal history of nicotine dependence: Secondary | ICD-10-CM | POA: Diagnosis not present

## 2020-01-16 DIAGNOSIS — I252 Old myocardial infarction: Secondary | ICD-10-CM | POA: Diagnosis not present

## 2020-01-16 DIAGNOSIS — I2510 Atherosclerotic heart disease of native coronary artery without angina pectoris: Secondary | ICD-10-CM | POA: Diagnosis not present

## 2020-01-16 DIAGNOSIS — I5032 Chronic diastolic (congestive) heart failure: Secondary | ICD-10-CM | POA: Diagnosis not present

## 2020-01-16 DIAGNOSIS — I4819 Other persistent atrial fibrillation: Secondary | ICD-10-CM | POA: Insufficient documentation

## 2020-01-16 LAB — CBC
HCT: 46.6 % — ABNORMAL HIGH (ref 36.0–46.0)
Hemoglobin: 14.7 g/dL (ref 12.0–15.0)
MCH: 31.5 pg (ref 26.0–34.0)
MCHC: 31.5 g/dL (ref 30.0–36.0)
MCV: 99.8 fL (ref 80.0–100.0)
Platelets: 236 10*3/uL (ref 150–400)
RBC: 4.67 MIL/uL (ref 3.87–5.11)
RDW: 13.2 % (ref 11.5–15.5)
WBC: 8.5 10*3/uL (ref 4.0–10.5)
nRBC: 0 % (ref 0.0–0.2)

## 2020-01-16 LAB — BASIC METABOLIC PANEL
Anion gap: 14 (ref 5–15)
BUN: 16 mg/dL (ref 8–23)
CO2: 22 mmol/L (ref 22–32)
Calcium: 8.9 mg/dL (ref 8.9–10.3)
Chloride: 103 mmol/L (ref 98–111)
Creatinine, Ser: 0.98 mg/dL (ref 0.44–1.00)
GFR calc Af Amer: >60 mL/min (ref >60)
GFR calc non Af Amer: 57 mL/min — ABNORMAL LOW (ref >60)
Glucose, Bld: 125 mg/dL — ABNORMAL HIGH (ref 70–99)
Potassium: 4 mmol/L (ref 3.5–5.1)
Sodium: 139 mmol/L (ref 135–145)

## 2020-01-16 NOTE — Progress Notes (Signed)
Primary Care Physician: [name], MD Referring Physician:[name] is a 74 y.o. female with a h/o PPM, chronic diastolic HF,  paroxysmal  Afib/flutter  that is in the afib clinic for persistent  afib for the last 2 weeks. She  feels more shortness of breath and has noted increase in pedal edema. She has had 3 prior  Ablations, most recent 03/14/2019. [name] She had had both covid shots and has not missed any anticoagulation with a CHA2DS2VASc of 3.   Today, she denies symptoms of palpitations, chest pain, shortness of breath, orthopnea, PND, lower extremity edema, dizziness, presyncope, syncope, or neurologic sequela. The patient is tolerating medications without difficulties and is otherwise without complaint today.   Past Medical History:  Diagnosis Date  . Anticoagulant long-term use    pradaxa  . Anxiety   . Arthritis    "fingers, lower back" (04/23/2017   . CAD (coronary artery disease) 6063,0160   post PTCA with bare-metal stenting to mid RCA in December 2004     . CHF (congestive heart failure) ([place])   . Chronic atrial fibrillation ([place]) 06/2007   Tachybradycardia pacemaker  . Chronic kidney disease    10% function - ?R, other kidney is compensating    . CVA (cerebral vascular accident) [facility]) [phone]   denies residual on 04/23/2017  . Depression   . Diplopia 06/19/2008   Qualifier: Diagnosis of  By: [name] MD, [name]   . Dysrhythmia    ATRIAL FIBRILATION  . Edema of lower extremity   . Hyperlipidemia   . Hypertension   . Inferior myocardial infarction [facility])    acute inferior wall mi/other medical hx  . Myocardial infarction ([place]) [id number]  . Obesity   . OSA on CPAP    last test- 2010  . Pacemaker   . Pneumonia 2014   tx. ----  [facility]  . Pulmonary hypertension ([place])    moderate pulmonary hypertension by 10/2016 echo and 10/2013 cardiac cath  . Shortness of breath   . Skin cancer    "cut off right Alfred; burned off LLE" (04/23/2017)    . Sleep apnea   . Spondylolisthesis   . TIA (transient ischemic attack) 2008  . Unspecified hemorrhoids without mention of complication 7/32/2025   Colonoscopy--Dr. Carlean Purl    Past Surgical History:  Procedure Laterality Date  . ABDOMINAL HYSTERECTOMY    . APPENDECTOMY  1984  . ATRIAL FIBRILLATION ABLATION N/A 09/29/2016   Procedure: Atrial Fibrillation Ablation;  Surgeon: Will Meredith Leeds, MD;  Location: [place] CV LAB;  Service: Cardiovascular;  Laterality: N/A;  . ATRIAL FIBRILLATION ABLATION N/A 02/07/2018   Procedure: ATRIAL FIBRILLATION ABLATION;  Surgeon: Constance Haw, MD;  Location: [place] CV LAB;  Service: Cardiovascular;  Laterality: N/A;  . ATRIAL FIBRILLATION ABLATION N/A 03/13/2019   Procedure: ATRIAL FIBRILLATION ABLATION;  Surgeon: Constance Haw, MD;  Location: [place] CV LAB;  Service: Cardiovascular;  Laterality: N/A;  . BACK SURGERY    . CARDIOVERSION N/A 09/12/2017   Procedure: CARDIOVERSION;  Surgeon: Jerline Pain, MD;  Location[facility] ENDOSCOPY;  Service: Cardiovascular;  Laterality: N/A;  . CARDIOVERSION N/A 12/13/2017   Procedure: CARDIOVERSION;  Surgeon: Sanda Klein, MD;  Location: [place] ENDOSCOPY;  Service: Cardiovascular;  Laterality: N/A;  . CARDIOVERSION N/A 02/18/2018   Procedure: CARDIOVERSION;  Surgeon: Dorothy Spark, MD;  Location: [facility] ENDOSCOPY;  Service: Cardiovascular;  Laterality: N/A;  . CARDIOVERSION N/A 05/20/2018   Procedure: CARDIOVERSION;  Surgeon: [name], MD;  Location: [facility] ENDOSCOPY;  Service: Cardiovascular;  Laterality: N/A;  . CARDIOVERSION N/A 04/16/2019   Procedure: CARDIOVERSION;  Surgeon: Donato Heinz, MD;  Location: [facility] ENDOSCOPY;  Service: Endoscopy;  Laterality: N/A;  . CATARACT EXTRACTION W/ INTRAOCULAR LENS  IMPLANT, BILATERAL Bilateral 01/15/2017- 03/2017  . CHOLECYSTECTOMY    . CORONARY ANGIOPLASTY  X 2  . CORONARY ANGIOPLASTY WITH STENT PLACEMENT  1998; ~ 2007; ?date   "1 stent;  replaced stent; not sure when I got the last stent" (04/23/2017)  . DOPPLER ECHOCARDIOGRAPHY  2009  . ELECTROPHYSIOLOGIC STUDY N/A 03/31/2016   Procedure: Cardioversion;  Surgeon: Evans Lance, MD;  Location: [place] CV LAB;  Service: Cardiovascular;  Laterality: N/A;  . ELECTROPHYSIOLOGIC STUDY N/A 08/04/2016   Procedure: Cardioversion;  Surgeon: Evans Lance, MD;  Location: [place] CV LAB;  Service: Cardiovascular;  Laterality: N/A;  . INSERT / REPLACE / REMOVE PACEMAKER  06/2007  . IR RADIOLOGY PERIPHERAL GUIDED IV START  01/31/2018  . IR US GUIDE VASC ACCESS LEFT  01/31/2018  . JOINT REPLACEMENT    . LAPAROSCOPIC CHOLECYSTECTOMY  1994  . LEFT AND RIGHT HEART CATHETERIZATION WITH CORONARY ANGIOGRAM N/A 10/06/2013   Procedure: LEFT AND RIGHT HEART CATHETERIZATION WITH CORONARY ANGIOGRAM;  Surgeon: Troy Sine, MD;  Location: [facility] CATH LAB;  Service: Cardiovascular;  Laterality: N/A;  . LEFT OOPHORECTOMY Left ~ 1989  . POSTERIOR LUMBAR FUSION  2000s - 04/2015 X 3   L3-4; L4-5; L2-3; Dr Trenton Gammon  . RIGHT/LEFT HEART CATH AND CORONARY ANGIOGRAPHY N/A 05/09/2017   Procedure: RIGHT/LEFT HEART CATH AND CORONARY ANGIOGRAPHY;  Surgeon: Sherren Mocha, MD;  Location: [place] CV LAB;  Service: Cardiovascular;  Laterality: N/A;  . SKIN CANCER EXCISION Right    [place]  . TEE WITHOUT CARDIOVERSION N/A 09/29/2016   Procedure: TRANSESOPHAGEAL ECHOCARDIOGRAM (TEE);  Surgeon: Jerline Pain, MD;  Location: [place];  Service: Cardiovascular;  Laterality: N/A;  . TOTAL ABDOMINAL HYSTERECTOMY  1984   "uterus & right ovary"  . TOTAL KNEE ARTHROPLASTY Left 04/23/2017  . TOTAL KNEE ARTHROPLASTY Left 04/23/2017   Procedure: TOTAL KNEE ARTHROPLASTY;  Surgeon: Frederik Pear, MD;  Location: [place];  Service: Orthopedics;  Laterality: Left;  . ULTRASOUND GUIDANCE FOR VASCULAR ACCESS  05/09/2017   Procedure: Ultrasound Guidance For Vascular Access;  Surgeon: Sherren Mocha, MD;  Location: [place] CV LAB;   Service: Cardiovascular;;    Current Outpatient Medications  Medication Sig Dispense Refill  . acetaminophen (TYLENOL) 500 MG tablet Take 1,000 mg by mouth daily.     . Ascorbic Acid (VITAMIN C) 1000 MG tablet Take 1,000 mg by mouth daily.     [name] atorvastatin (LIPITOR) 40 MG tablet TAKE 1 TABLET BY MOUTH EVERY DAY 90 tablet 3  . Calcium Carbonate-Vitamin D (CALCIUM 600+D PO) Take 1 tablet by mouth at bedtime.     . Cholecalciferol (VITAMIN D3) 125 MCG (5000 UT) TABS Take 5,000 Units by mouth every evening.    . diltiazem (CARDIZEM CD) 240 MG 24 hr capsule TAKE 1 CAPSULE BY MOUTH EVERY DAY **DOSE INCREASE** 90 capsule 2  . diphenhydramine-acetaminophen (TYLENOL PM) 25-500 MG TABS tablet Take 2 tablets by mouth at bedtime.     [name] ELIQUIS 5 MG TABS tablet TAKE 1 TABLET BY MOUTH TWICE A DAY 180 tablet 1  . FLUoxetine (PROZAC) 20 MG capsule Take 1 capsule (20 mg total) by mouth daily. Please schedule follow up for refills. [phone] Thank you!  30 capsule 0  . fluticasone (FLONASE) 50 MCG/ACT nasal spray Place 1 spray into both nostrils daily.     [name] KLOR-CON M20 20 MEQ tablet TAKE 1 TABLET BY MOUTH TWICE A DAY 60 tablet 0  . loratadine (CLARITIN) 10 MG tablet TAKE 1 TABLET BY MOUTH EVERY DAY 90 tablet 1  . Lysine 1000 MG TABS Take 1,000 mg by mouth at bedtime.     . Multiple Vitamin (MULTIVITAMIN WITH MINERALS) TABS tablet Take 1 tablet by mouth daily. Centrum    . nitroGLYCERIN (NITROSTAT) 0.4 MG SL tablet PLACE 1 TABLET (0.4 MG TOTAL) UNDER THE TONGUE EVERY 5 (FIVE) MINUTES X 3 DOSES AS NEEDED FOR CHEST PAIN. 75 tablet 1  . omeprazole (PRILOSEC) 20 MG capsule Take 20 mg by mouth daily before breakfast.     . Polyethyl Glycol-Propyl Glycol (SYSTANE) 0.4-0.3 % SOLN Place 1 drop into both eyes 2 (two) times daily as needed (dry/irritated eyes.).    [name] torsemide (DEMADEX) 20 MG tablet Take 2 tablets (40 mg total) by mouth 2 (two) times daily. 360 tablet 3  . valACYclovir (VALTREX) 1000 MG tablet Take  2 tablets (2,000 mg total) by mouth every 12 (twelve) hours as needed (fever blisters/cold sores.). 30 tablet 1  . vitamin B-12 (CYANOCOBALAMIN) 1000 MCG tablet Take 2,000 mcg by mouth daily.     . vitamin E 1000 UNIT capsule Take 1,000 Units by mouth at bedtime.     No current facility-administered medications for this encounter.    Allergies  Allergen Reactions  . Adhesive [Tape] Rash and Other (See Comments)    Allergic to EKG stickers and defibrillation pads.    Social History   Socioeconomic History  . Marital status: Married    Spouse name: Not on file  . Number of children: 0  . Years of education: HS  . Highest education level: Not on file  Occupational History  . Occupation: retired    Comment: previously worked [employer]  . Smoking status: Former Smoker    Packs/day: 1.00    Years: 5.00    Pack years: 5.00    Types: Cigarettes    Start date: 05/11/1978    Quit date: 07/03/1982    Years since quitting: 37.5  . Smokeless tobacco: Never Used  Vaping Use  . Vaping Use: Never used  Substance and Sexual Activity  . Alcohol use: Yes    Comment: 04/23/2017 "once q 6 months; glass of wine"  . Drug use: No  . Sexual activity: Not Currently  Other Topics Concern  . 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   Married    Originally from [employer] of two, high school education   Former smoker Pension scheme manager dogs 7    Retired from [employer] 2    Social Determinants of Radio broadcast assistant Strain:   . Difficulty of Paying Living Expenses:   Food Insecurity:   . Worried About Charity fundraiser in the Last Year:   . Arboriculturist in the Last Year:   Transportation Needs: No Transportation Needs  . Lack of Transportation (Medical): No  . Lack of Transportation (Non-Medical): No  Physical Activity:   . Days of Exercise per Week:   .  Minutes of Exercise per Session:   Stress:   . Feeling  of Stress :   Social Connections:   . Frequency of Communication with Friends and Family:   . Frequency of Social Gatherings with Friends and Family:   . Attends Religious Services:   . Active Member of Clubs or Organizations:   . Attends Archivist Meetings:   [name] Marital Status:   Intimate Partner Violence:   . Fear of Current or Ex-Partner:   . Emotionally Abused:   [name] Physically Abused:   . Sexually Abused:     Family History  Problem Relation Age of Onset  . Suicidality Father        suicide death pt was 3 yrs, 32  . Arrhythmia Mother   . Hypertension Mother   . Diabetes Mother   . Dementia Mother   . Heart attack Brother   . Heart disease Paternal Aunt   . Prostate cancer Maternal Grandfather   . Diabetes Paternal Grandfather        fathers side of the family  . Colon cancer Maternal Aunt     ROS- All systems are reviewed and negative except as per the HPI above  Physical Exam: Vitals:   01/16/20 1024  BP: (!) 146/88  Pulse: (!) 114  Weight: 124.9 kg  Height: 5\' 5"  (1.651 m)   Wt Readings from Last 3 Encounters:  01/16/20 124.9 kg  09/15/19 123.4 kg  07/11/19 122 kg    Labs: Lab Results  Component Value Date   NA 140 06/25/2019   K 4.5 06/25/2019   CL 97 06/25/2019   CO2 25 06/25/2019   GLUCOSE 129 (H) 06/25/2019   BUN 17 06/25/2019   CREATININE 1.03 (H) 06/25/2019   CALCIUM 9.4 06/25/2019   PHOS 4.0 06/01/2018   MG 2.3 06/01/2018   Lab Results  Component Value Date   INR 1.0 05/08/2017   Lab Results  Component Value Date   CHOL 151 04/10/2019   HDL 50.80 04/10/2019   LDLCALC 68 04/10/2019   TRIG 160.0 (H) 04/10/2019     GEN- The patient is well appearing, alert and oriented x 3 today.   Head- normocephalic, atraumatic Eyes-  Sclera clear, conjunctiva pink Ears- hearing intact Oropharynx- clear Neck- supple, no JVP Lymph- no cervical lymphadenopathy Lungs- Clear to  ausculation bilaterally, normal work of breathing Heart- irregular rate and rhythm, no murmurs, rubs or gallops, PMI not laterally displaced GI- soft, NT, ND, + BS Extremities- no clubbing, cyanosis, or edema MS- no significant deformity or atrophy Skin- no rash or lesion Psych- euthymic mood, full affect Neuro- strength and sensation are intact  EKG-atrial flutter at 114 bpm    Assessment and Plan: 1. Persistent  atrial flutter  Will plan for cardioversion Continue  diltiazem 240 daily Will schedule cardioversion  Bmet/mag today/covid   2. CHA2DS2VASc score of 3 Continue eliquis 5 mg bid  States no missed doses for at least 3 weeks   3. Diastolic HF Increase torsemide to 60 mg in am and usual 40 mg pm for the next 3 days  F/u here one week after cardioversion   Butch Penny C. Rohil Lesch, [facility] [address] [place], [place] [address] [phone]

## 2020-01-16 NOTE — Telephone Encounter (Signed)
Pt ws on Multaq and only took this for a week and developed SOB and weight gain so stopped./cy

## 2020-01-16 NOTE — Patient Instructions (Signed)
Cardioversion scheduled for Thursday, July 22nd  - Arrive at the [facility] and go to admitting at 8:30AM  - Do not eat or drink anything after midnight the night prior to your procedure.  - Take all your morning medication (except diabetic medications) with a sip of water prior to arrival.  - You will not be able to drive home after your procedure.  - Do NOT miss any doses of your blood thinner - if you should miss a dose please notify our office immediately.  Increase toresmide to 60mg  in the morning and 40mg  in the evening over weekend then return to normal dosing on Monday

## 2020-01-16 NOTE — Telephone Encounter (Addendum)
Spoke with pt and pt has been in afib more than not for quite some time Pt is complaining of SOB with little activity Also notes swelling Per pt rate this am was 119 after taking Diltiazem Spoke with afib clinic and they can see pt this am at 10:30 Called pt back and she can make this appt to be evaluated./cy

## 2020-01-16 NOTE — H&P (View-Only) (Signed)
Primary Care Physician: [name], MD Referring Physician:[name] is a 74 y.o. female with a h/o PPM, chronic diastolic HF,  paroxysmal  Afib/flutter  that is in the afib clinic for persistent  afib for the last 2 weeks. She  feels more shortness of breath and has noted increase in pedal edema. She has had 3 prior  Ablations, most recent 03/14/2019. [name] She had had both covid shots and has not missed any anticoagulation with a CHA2DS2VASc of 3.   Today, she denies symptoms of palpitations, chest pain, shortness of breath, orthopnea, PND, lower extremity edema, dizziness, presyncope, syncope, or neurologic sequela. The patient is tolerating medications without difficulties and is otherwise without complaint today.   Past Medical History:  Diagnosis Date  . Anticoagulant long-term use    pradaxa  . Anxiety   . Arthritis    "fingers, lower back" (04/23/2017   . CAD (coronary artery disease) 9233,0076   post PTCA with bare-metal stenting to mid RCA in December 2004     . CHF (congestive heart failure) ([place])   . Chronic atrial fibrillation ([place]) 06/2007   Tachybradycardia pacemaker  . Chronic kidney disease    10% function - ?R, other kidney is compensating    . CVA (cerebral vascular accident) [facility]) [phone]   denies residual on 04/23/2017  . Depression   . Diplopia 06/19/2008   Qualifier: Diagnosis of  By: [name] MD, [name]   . Dysrhythmia    ATRIAL FIBRILATION  . Edema of lower extremity   . Hyperlipidemia   . Hypertension   . Inferior myocardial infarction [facility])    acute inferior wall mi/other medical hx  . Myocardial infarction ([place]) [id number]  . Obesity   . OSA on CPAP    last test- 2010  . Pacemaker   . Pneumonia 2014   tx. ----  [facility]  . Pulmonary hypertension ([place])    moderate pulmonary hypertension by 10/2016 echo and 10/2013 cardiac cath  . Shortness of breath   . Skin cancer    "cut off right Wulff; burned off LLE" (04/23/2017)    . Sleep apnea   . Spondylolisthesis   . TIA (transient ischemic attack) 2008  . Unspecified hemorrhoids without mention of complication 5/62/5638   Colonoscopy--Dr. Carlean Purl    Past Surgical History:  Procedure Laterality Date  . ABDOMINAL HYSTERECTOMY    . APPENDECTOMY  1984  . ATRIAL FIBRILLATION ABLATION N/A 09/29/2016   Procedure: Atrial Fibrillation Ablation;  Surgeon: Will Meredith Leeds, MD;  Location: [place] CV LAB;  Service: Cardiovascular;  Laterality: N/A;  . ATRIAL FIBRILLATION ABLATION N/A 02/07/2018   Procedure: ATRIAL FIBRILLATION ABLATION;  Surgeon: Constance Haw, MD;  Location: [place] CV LAB;  Service: Cardiovascular;  Laterality: N/A;  . ATRIAL FIBRILLATION ABLATION N/A 03/13/2019   Procedure: ATRIAL FIBRILLATION ABLATION;  Surgeon: Constance Haw, MD;  Location: [place] CV LAB;  Service: Cardiovascular;  Laterality: N/A;  . BACK SURGERY    . CARDIOVERSION N/A 09/12/2017   Procedure: CARDIOVERSION;  Surgeon: Jerline Pain, MD;  Location: [facility] ENDOSCOPY;  Service: Cardiovascular;  Laterality: N/A;  . CARDIOVERSION N/A 12/13/2017   Procedure: CARDIOVERSION;  Surgeon: Sanda Klein, MD;  Location: [place] ENDOSCOPY;  Service: Cardiovascular;  Laterality: N/A;  . CARDIOVERSION N/A 02/18/2018   Procedure: CARDIOVERSION;  Surgeon: Dorothy Spark, MD;  Location: [facility] ENDOSCOPY;  Service: Cardiovascular;  Laterality: N/A;  . CARDIOVERSION N/A 05/20/2018   Procedure: CARDIOVERSION;  Surgeon: [name], MD;  Location: [facility] ENDOSCOPY;  Service: Cardiovascular;  Laterality: N/A;  . CARDIOVERSION N/A 04/16/2019   Procedure: CARDIOVERSION;  Surgeon: Donato Heinz, MD;  Location: [facility] ENDOSCOPY;  Service: Endoscopy;  Laterality: N/A;  . CATARACT EXTRACTION W/ INTRAOCULAR LENS  IMPLANT, BILATERAL Bilateral 01/15/2017- 03/2017  . CHOLECYSTECTOMY    . CORONARY ANGIOPLASTY  X 2  . CORONARY ANGIOPLASTY WITH STENT PLACEMENT  1998; ~ 2007; ?date   "1 stent;  replaced stent; not sure when I got the last stent" (04/23/2017)  . DOPPLER ECHOCARDIOGRAPHY  2009  . ELECTROPHYSIOLOGIC STUDY N/A 03/31/2016   Procedure: Cardioversion;  Surgeon: Evans Lance, MD;  Location: [place] CV LAB;  Service: Cardiovascular;  Laterality: N/A;  . ELECTROPHYSIOLOGIC STUDY N/A 08/04/2016   Procedure: Cardioversion;  Surgeon: Evans Lance, MD;  Location: [place] CV LAB;  Service: Cardiovascular;  Laterality: N/A;  . INSERT / REPLACE / REMOVE PACEMAKER  06/2007  . IR RADIOLOGY PERIPHERAL GUIDED IV START  01/31/2018  . IR US GUIDE VASC ACCESS LEFT  01/31/2018  . JOINT REPLACEMENT    . LAPAROSCOPIC CHOLECYSTECTOMY  1994  . LEFT AND RIGHT HEART CATHETERIZATION WITH CORONARY ANGIOGRAM N/A 10/06/2013   Procedure: LEFT AND RIGHT HEART CATHETERIZATION WITH CORONARY ANGIOGRAM;  Surgeon: Troy Sine, MD;  Location: [facility] CATH LAB;  Service: Cardiovascular;  Laterality: N/A;  . LEFT OOPHORECTOMY Left ~ 1989  . POSTERIOR LUMBAR FUSION  2000s - 04/2015 X 3   L3-4; L4-5; L2-3; Dr Trenton Gammon  . RIGHT/LEFT HEART CATH AND CORONARY ANGIOGRAPHY N/A 05/09/2017   Procedure: RIGHT/LEFT HEART CATH AND CORONARY ANGIOGRAPHY;  Surgeon: Sherren Mocha, MD;  Location: [place] CV LAB;  Service: Cardiovascular;  Laterality: N/A;  . SKIN CANCER EXCISION Right    [place]  . TEE WITHOUT CARDIOVERSION N/A 09/29/2016   Procedure: TRANSESOPHAGEAL ECHOCARDIOGRAM (TEE);  Surgeon: Jerline Pain, MD;  Location: [place];  Service: Cardiovascular;  Laterality: N/A;  . TOTAL ABDOMINAL HYSTERECTOMY  1984   "uterus & right ovary"  . TOTAL KNEE ARTHROPLASTY Left 04/23/2017  . TOTAL KNEE ARTHROPLASTY Left 04/23/2017   Procedure: TOTAL KNEE ARTHROPLASTY;  Surgeon: Frederik Pear, MD;  Location: [place];  Service: Orthopedics;  Laterality: Left;  . ULTRASOUND GUIDANCE FOR VASCULAR ACCESS  05/09/2017   Procedure: Ultrasound Guidance For Vascular Access;  Surgeon: Sherren Mocha, MD;  Location: [place] CV LAB;   Service: Cardiovascular;;    Current Outpatient Medications  Medication Sig Dispense Refill  . acetaminophen (TYLENOL) 500 MG tablet Take 1,000 mg by mouth daily.     . Ascorbic Acid (VITAMIN C) 1000 MG tablet Take 1,000 mg by mouth daily.     [name] atorvastatin (LIPITOR) 40 MG tablet TAKE 1 TABLET BY MOUTH EVERY DAY 90 tablet 3  . Calcium Carbonate-Vitamin D (CALCIUM 600+D PO) Take 1 tablet by mouth at bedtime.     . Cholecalciferol (VITAMIN D3) 125 MCG (5000 UT) TABS Take 5,000 Units by mouth every evening.    . diltiazem (CARDIZEM CD) 240 MG 24 hr capsule TAKE 1 CAPSULE BY MOUTH EVERY DAY **DOSE INCREASE** 90 capsule 2  . diphenhydramine-acetaminophen (TYLENOL PM) 25-500 MG TABS tablet Take 2 tablets by mouth at bedtime.     [name] ELIQUIS 5 MG TABS tablet TAKE 1 TABLET BY MOUTH TWICE A DAY 180 tablet 1  . FLUoxetine (PROZAC) 20 MG capsule Take 1 capsule (20 mg total) by mouth daily. Please schedule follow up for refills. [phone] Thank you!  30 capsule 0  . fluticasone (FLONASE) 50 MCG/ACT nasal spray Place 1 spray into both nostrils daily.     [name] KLOR-CON M20 20 MEQ tablet TAKE 1 TABLET BY MOUTH TWICE A DAY 60 tablet 0  . loratadine (CLARITIN) 10 MG tablet TAKE 1 TABLET BY MOUTH EVERY DAY 90 tablet 1  . Lysine 1000 MG TABS Take 1,000 mg by mouth at bedtime.     . Multiple Vitamin (MULTIVITAMIN WITH MINERALS) TABS tablet Take 1 tablet by mouth daily. Centrum    . nitroGLYCERIN (NITROSTAT) 0.4 MG SL tablet PLACE 1 TABLET (0.4 MG TOTAL) UNDER THE TONGUE EVERY 5 (FIVE) MINUTES X 3 DOSES AS NEEDED FOR CHEST PAIN. 75 tablet 1  . omeprazole (PRILOSEC) 20 MG capsule Take 20 mg by mouth daily before breakfast.     . Polyethyl Glycol-Propyl Glycol (SYSTANE) 0.4-0.3 % SOLN Place 1 drop into both eyes 2 (two) times daily as needed (dry/irritated eyes.).    [name] torsemide (DEMADEX) 20 MG tablet Take 2 tablets (40 mg total) by mouth 2 (two) times daily. 360 tablet 3  . valACYclovir (VALTREX) 1000 MG tablet Take  2 tablets (2,000 mg total) by mouth every 12 (twelve) hours as needed (fever blisters/cold sores.). 30 tablet 1  . vitamin B-12 (CYANOCOBALAMIN) 1000 MCG tablet Take 2,000 mcg by mouth daily.     . vitamin E 1000 UNIT capsule Take 1,000 Units by mouth at bedtime.     No current facility-administered medications for this encounter.    Allergies  Allergen Reactions  . Adhesive [Tape] Rash and Other (See Comments)    Allergic to EKG stickers and defibrillation pads.    Social History   Socioeconomic History  . Marital status: Married    Spouse name: Not on file  . Number of children: 0  . Years of education: HS  . Highest education level: Not on file  Occupational History  . Occupation: retired    Comment: previously worked [employer]  . Smoking status: Former Smoker    Packs/day: 1.00    Years: 5.00    Pack years: 5.00    Types: Cigarettes    Start date: 05/11/1978    Quit date: 07/03/1982    Years since quitting: 37.5  . Smokeless tobacco: Never Used  Vaping Use  . Vaping Use: Never used  Substance and Sexual Activity  . Alcohol use: Yes    Comment: 04/23/2017 "once q 6 months; glass of wine"  . Drug use: No  . Sexual activity: Not Currently  Other Topics Concern  . 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   Married    Originally from [employer] [place] of two, high school education   Former smoker Pension scheme manager dogs 7    Retired from [employer] 2    Social Determinants of Radio broadcast assistant Strain:   . Difficulty of Paying Living Expenses:   Food Insecurity:   . Worried About Charity fundraiser in the Last Year:   . Arboriculturist in the Last Year:   Transportation Needs: No Transportation Needs  . Lack of Transportation (Medical): No  . Lack of Transportation (Non-Medical): No  Physical Activity:   . Days of Exercise per Week:   .  Minutes of Exercise per Session:   Stress:   . Feeling  of Stress :   Social Connections:   . Frequency of Communication with Friends and Family:   . Frequency of Social Gatherings with Friends and Family:   . Attends Religious Services:   . Active Member of Clubs or Organizations:   . Attends Archivist Meetings:   [name] Marital Status:   Intimate Partner Violence:   . Fear of Current or Ex-Partner:   . Emotionally Abused:   [name] Physically Abused:   . Sexually Abused:     Family History  Problem Relation Age of Onset  . Suicidality Father        suicide death pt was 3 yrs, 39  . Arrhythmia Mother   . Hypertension Mother   . Diabetes Mother   . Dementia Mother   . Heart attack Brother   . Heart disease Paternal Aunt   . Prostate cancer Maternal Grandfather   . Diabetes Paternal Grandfather        fathers side of the family  . Colon cancer Maternal Aunt     ROS- All systems are reviewed and negative except as per the HPI above  Physical Exam: Vitals:   01/16/20 1024  BP: (!) 146/88  Pulse: (!) 114  Weight: 124.9 kg  Height: 5\' 5"  (1.651 m)   Wt Readings from Last 3 Encounters:  01/16/20 124.9 kg  09/15/19 123.4 kg  07/11/19 122 kg    Labs: Lab Results  Component Value Date   NA 140 06/25/2019   K 4.5 06/25/2019   CL 97 06/25/2019   CO2 25 06/25/2019   GLUCOSE 129 (H) 06/25/2019   BUN 17 06/25/2019   CREATININE 1.03 (H) 06/25/2019   CALCIUM 9.4 06/25/2019   PHOS 4.0 06/01/2018   MG 2.3 06/01/2018   Lab Results  Component Value Date   INR 1.0 05/08/2017   Lab Results  Component Value Date   CHOL 151 04/10/2019   HDL 50.80 04/10/2019   LDLCALC 68 04/10/2019   TRIG 160.0 (H) 04/10/2019     GEN- The patient is well appearing, alert and oriented x 3 today.   Head- normocephalic, atraumatic Eyes-  Sclera clear, conjunctiva pink Ears- hearing intact Oropharynx- clear Neck- supple, no JVP Lymph- no cervical lymphadenopathy Lungs- Clear to  ausculation bilaterally, normal work of breathing Heart- irregular rate and rhythm, no murmurs, rubs or gallops, PMI not laterally displaced GI- soft, NT, ND, + BS Extremities- no clubbing, cyanosis, or edema MS- no significant deformity or atrophy Skin- no rash or lesion Psych- euthymic mood, full affect Neuro- strength and sensation are intact  EKG-atrial flutter at 114 bpm    Assessment and Plan: 1. Persistent  atrial flutter  Will plan for cardioversion Continue  diltiazem 240 daily Will schedule cardioversion  Bmet/mag today/covid   2. CHA2DS2VASc score of 3 Continue eliquis 5 mg bid  States no missed doses for at least 3 weeks   3. Diastolic HF Increase torsemide to 60 mg in am and usual 40 mg pm for the next 3 days  F/u here one week after cardioversion   Butch Penny C. Odena Mcquaid, [facility] [address] [place], [place] [address] [phone]

## 2020-01-16 NOTE — Telephone Encounter (Signed)
Patient states due to symptoms she would like to discuss possibility of scheduling a cardioversion to correct afib.  Patient c/o Palpitations:  High priority if patient c/o lightheadedness, shortness of breath, or chest pain  1) How long have you had palpitations/irregular HR/ Afib? Are you having the symptoms now? Afib - off and on for a couple of months per patient  2) Are you currently experiencing lightheadedness, SOB or CP? SOB  3) Do you have a history of afib (atrial fibrillation) or irregular heart rhythm? Yes  4) Have you checked your BP or HR? (document readings if available): 07/16 - HR: 119  5) Are you experiencing any other symptoms? No  Pt c/o Shortness Of Breath: STAT if SOB developed within the last 24 hours or pt is noticeably SOB on the phone  1. Are you currently SOB (can you hear that pt is SOB on the phone)? No  2. How long have you been experiencing SOB? Past few months on and off per patient  3. Are you SOB when sitting or when up moving around? When up and moving around  4. Are you currently experiencing any other symptoms? No

## 2020-01-16 NOTE — Addendum Note (Signed)
Addended by: Devra Dopp E on: 01/16/2020 09:24 AM   Modules accepted: Orders

## 2020-01-20 ENCOUNTER — Other Ambulatory Visit (HOSPITAL_COMMUNITY)
Admission: RE | Admit: 2020-01-20 | Discharge: 2020-01-20 | Disposition: A | Discharge status: Home / Self Care | Payer: PPO | Source: Ambulatory Visit | Attending: Cardiovascular Disease | Admitting: Cardiovascular Disease

## 2020-01-20 DIAGNOSIS — Z01812 Encounter for preprocedural laboratory examination: Secondary | ICD-10-CM | POA: Insufficient documentation

## 2020-01-20 DIAGNOSIS — Z20822 Contact with and (suspected) exposure to covid-19: Secondary | ICD-10-CM | POA: Diagnosis not present

## 2020-01-20 LAB — SARS CORONAVIRUS 2 (TAT 6-24 HRS): SARS Coronavirus 2: NEGATIVE

## 2020-01-21 NOTE — Progress Notes (Signed)
Pre op call done for endo procedure tomorrow 01/22/20. Patient states she has been quarantined at home since covid test, she has not missed any doses of her blood thinner and will take in morning before she comes, and has someone driving her home post procedure. All questions addressed.

## 2020-01-22 ENCOUNTER — Ambulatory Visit (HOSPITAL_COMMUNITY): Payer: PPO | Admitting: Anesthesiology

## 2020-01-22 ENCOUNTER — Other Ambulatory Visit: Payer: Self-pay

## 2020-01-22 ENCOUNTER — Ambulatory Visit (HOSPITAL_COMMUNITY)
Admission: RE | Admit: 2020-01-22 | Discharge: 2020-01-22 | Disposition: A | Discharge status: Home / Self Care | Payer: PPO | Attending: Cardiovascular Disease | Admitting: Cardiovascular Disease

## 2020-01-22 ENCOUNTER — Encounter (HOSPITAL_COMMUNITY)
Admission: RE | Disposition: A | Discharge status: Home / Self Care | Payer: Self-pay | Source: Home / Self Care | Attending: Cardiovascular Disease

## 2020-01-22 ENCOUNTER — Encounter (HOSPITAL_COMMUNITY): Payer: Self-pay | Admitting: Cardiovascular Disease

## 2020-01-22 ENCOUNTER — Other Ambulatory Visit: Payer: Self-pay | Admitting: Internal Medicine

## 2020-01-22 DIAGNOSIS — E785 Hyperlipidemia, unspecified: Secondary | ICD-10-CM | POA: Insufficient documentation

## 2020-01-22 DIAGNOSIS — Z87891 Personal history of nicotine dependence: Secondary | ICD-10-CM | POA: Insufficient documentation

## 2020-01-22 DIAGNOSIS — I252 Old myocardial infarction: Secondary | ICD-10-CM | POA: Insufficient documentation

## 2020-01-22 DIAGNOSIS — G4733 Obstructive sleep apnea (adult) (pediatric): Secondary | ICD-10-CM | POA: Diagnosis not present

## 2020-01-22 DIAGNOSIS — G4730 Sleep apnea, unspecified: Secondary | ICD-10-CM | POA: Insufficient documentation

## 2020-01-22 DIAGNOSIS — E669 Obesity, unspecified: Secondary | ICD-10-CM | POA: Insufficient documentation

## 2020-01-22 DIAGNOSIS — I4891 Unspecified atrial fibrillation: Secondary | ICD-10-CM

## 2020-01-22 DIAGNOSIS — Z79899 Other long term (current) drug therapy: Secondary | ICD-10-CM | POA: Diagnosis not present

## 2020-01-22 DIAGNOSIS — Z7901 Long term (current) use of anticoagulants: Secondary | ICD-10-CM | POA: Diagnosis not present

## 2020-01-22 DIAGNOSIS — I4892 Unspecified atrial flutter: Secondary | ICD-10-CM | POA: Diagnosis not present

## 2020-01-22 DIAGNOSIS — Z8673 Personal history of transient ischemic attack (TIA), and cerebral infarction without residual deficits: Secondary | ICD-10-CM | POA: Diagnosis not present

## 2020-01-22 DIAGNOSIS — I130 Hypertensive heart and chronic kidney disease with heart failure and stage 1 through stage 4 chronic kidney disease, or unspecified chronic kidney disease: Secondary | ICD-10-CM | POA: Diagnosis not present

## 2020-01-22 DIAGNOSIS — I480 Paroxysmal atrial fibrillation: Secondary | ICD-10-CM | POA: Diagnosis not present

## 2020-01-22 DIAGNOSIS — N189 Chronic kidney disease, unspecified: Secondary | ICD-10-CM | POA: Diagnosis not present

## 2020-01-22 DIAGNOSIS — E1122 Type 2 diabetes mellitus with diabetic chronic kidney disease: Secondary | ICD-10-CM | POA: Diagnosis not present

## 2020-01-22 DIAGNOSIS — F329 Major depressive disorder, single episode, unspecified: Secondary | ICD-10-CM | POA: Insufficient documentation

## 2020-01-22 DIAGNOSIS — Z6841 Body Mass Index (BMI) 40.0 and over, adult: Secondary | ICD-10-CM | POA: Diagnosis not present

## 2020-01-22 DIAGNOSIS — I2510 Atherosclerotic heart disease of native coronary artery without angina pectoris: Secondary | ICD-10-CM | POA: Insufficient documentation

## 2020-01-22 DIAGNOSIS — Z950 Presence of cardiac pacemaker: Secondary | ICD-10-CM | POA: Insufficient documentation

## 2020-01-22 DIAGNOSIS — I5033 Acute on chronic diastolic (congestive) heart failure: Secondary | ICD-10-CM | POA: Diagnosis not present

## 2020-01-22 DIAGNOSIS — I5032 Chronic diastolic (congestive) heart failure: Secondary | ICD-10-CM | POA: Insufficient documentation

## 2020-01-22 HISTORY — PX: CARDIOVERSION: SHX1299

## 2020-01-22 LAB — POCT I-STAT, CHEM 8
BUN: 22 mg/dL (ref 8–23)
Calcium, Ion: 1.13 mmol/L — ABNORMAL LOW (ref 1.15–1.40)
Chloride: 99 mmol/L (ref 98–111)
Creatinine, Ser: 0.9 mg/dL (ref 0.44–1.00)
Glucose, Bld: 143 mg/dL — ABNORMAL HIGH (ref 70–99)
HCT: 45 % (ref 36.0–46.0)
Hemoglobin: 15.3 g/dL — ABNORMAL HIGH (ref 12.0–15.0)
Potassium: 3.7 mmol/L (ref 3.5–5.1)
Sodium: 140 mmol/L (ref 135–145)
TCO2: 29 mmol/L (ref 22–32)

## 2020-01-22 SURGERY — CARDIOVERSION
Anesthesia: General

## 2020-01-22 MED ORDER — PROPOFOL 10 MG/ML IV BOLUS
INTRAVENOUS | Status: DC | PRN
  Administered 2020-01-22: 50 mg via INTRAVENOUS

## 2020-01-22 MED ORDER — LIDOCAINE 2% (20 MG/ML) 5 ML SYRINGE
INTRAMUSCULAR | Status: DC | PRN
  Administered 2020-01-22: 100 mg via INTRAVENOUS

## 2020-01-22 MED ORDER — SODIUM CHLORIDE 0.9 % IV SOLN
INTRAVENOUS | Status: AC | PRN
  Administered 2020-01-22: 500 mL via INTRAVENOUS

## 2020-01-22 NOTE — Interval H&P Note (Signed)
History and Physical Interval Note:  01/22/2020 9:07 AM  [name]  has presented today for surgery, with the diagnosis of AFIB.  The various methods of treatment have been discussed with the patient and family. After consideration of risks, benefits and other options for treatment, the patient has consented to  Procedure(s): CARDIOVERSION (N/A) as a surgical intervention.  The patient's history has been reviewed, patient examined, no change in status, stable for surgery.  I have reviewed the patient's chart and labs.  Questions were answered to the patient's satisfaction.    DCCV for afib. On eliquis >3 weeks. NPO.  [place] T. Audie Box, [place]  [address] [place], [place] [address] [phone]  9:08 AM

## 2020-01-22 NOTE — Discharge Instructions (Signed)
Electrical Cardioversion  Follow these instructions at home:  Do not drive for 24 hours if you were given a sedative during your procedure.  Take over-the-counter and prescription medicines only as told by your health care provider.  Ask your health care provider how to check your pulse. Check it often.  Rest for 48 hours after the procedure or as told by your health care provider.  Avoid or limit your caffeine use as told by your health care provider.  Keep all follow-up visits as told by your health care provider. This is important. Contact a health care provider if:  You feel like your heart is beating too quickly or your pulse is not regular.  You have a serious muscle cramp that does not go away. Get help right away if:  You have discomfort in your chest.  You are dizzy or you feel faint.  You have trouble breathing or you are short of breath.  Your speech is slurred.  You have trouble moving an arm or leg on one side of your body.  Your fingers or toes turn cold or blue. Summary  Electrical cardioversion is the delivery of a jolt of electricity to restore a normal rhythm to the heart.  This procedure may be done right away in an emergency or may be a scheduled procedure if the condition is not an emergency.  Generally, this is a safe procedure.  After the procedure, check your pulse often as told by your health care provider. This information is not intended to replace advice given to you by your health care provider. Make sure you discuss any questions you have with your health care provider. Document Revised: 01/20/2019 Document Reviewed: 01/20/2019 Elsevier Patient Education  2020 Elsevier Inc.  

## 2020-01-22 NOTE — CV Procedure (Signed)
   DIRECT CURRENT CARDIOVERSION  NAME:  [name]    MRN: [id number] DOB:  07/30/1945    ADMIT DATE: 01/22/2020  Indication:  Symptomatic atrial fibrillation  Procedure Note:  The patient signed informed consent.  They have had had therapeutic anticoagulation with eliquis greater than 3 weeks.  Anesthesia was administered by Dr. Kerin Perna.  Adequate airway was maintained throughout and vital followed per protocol.  They were cardioverted x 1 with 200J of biphasic synchronized energy.  They converted to NSR.  There were no apparent complications.  The patient had normal neuro status and respiratory status post procedure with vitals stable as recorded elsewhere.    Follow up: They will continue on current medical therapy and follow up with cardiology as scheduled.  [place] B[place] T. Audie Box, [place]  [address] [place], [place] [address] [phone]  9:29 AM

## 2020-01-22 NOTE — Transfer of Care (Signed)
Immediate Anesthesia Transfer of Care Note  Patient: [name]  Procedure(s) Performed: CARDIOVERSION (N/A )  Patient Location: Endoscopy Unit  Anesthesia Type:General  Level of Consciousness: drowsy and patient cooperative  Airway & Oxygen Therapy: Patient Spontanous Breathing and Patient connected to nasal cannula oxygen  Post-op Assessment: Report given to RN, Post -op Vital signs reviewed and stable and Patient moving all extremities  Post vital signs: Reviewed and stable  Last Vitals:  Vitals Value Taken Time  BP    Temp    Pulse 81 01/22/20 0932  Resp 25 01/22/20 0932  SpO2 92 % 01/22/20 0932  Vitals shown include unvalidated device data.  Last Pain:  Vitals:   01/22/20 0826  TempSrc: Temporal  PainSc: 0-No pain         Complications: No complications documented.

## 2020-01-22 NOTE — Anesthesia Preprocedure Evaluation (Signed)
Anesthesia Evaluation  Patient identified by MRN, date of birth, ID band Patient awake    Reviewed: Allergy & Precautions, NPO status , Patient's Chart, lab work & pertinent test results  History of Anesthesia Complications Negative for: history of anesthetic complications  Airway Mallampati: I  TM Distance: >3 FB Neck ROM: Full    Dental  (+) Teeth Intact   Pulmonary sleep apnea and Continuous Positive Airway Pressure Ventilation , former smoker,    Pulmonary exam normal        Cardiovascular hypertension, Pt. on medications + CAD, + Past MI, + Cardiac Stents and +CHF  + dysrhythmias Atrial Fibrillation + pacemaker  Rhythm:Irregular Rate:Normal   '18 Cath - Single-vessel coronary artery disease with continued patency of the stented segment in the right coronary artery and chronic occlusion of a small acute marginal branch collateralized by the left coronary artery 2.  Patent left main, LAD, and left circumflex without significant stenosis 3.  Vigorous LV systolic function with mildly elevated LVEDP 4.  Severe pulmonary hypertension with mean pulmonary artery pressure of 48 mmHg.  Because of high cardiac output (8.5 L/min), the patient's pulmonary vascular resistance is only mildly increased at 3 Wood units.  '18 TTE - Moderate concentric LVH. EF 55% to 60%. Grade 2 diastolic dysfunction. Mild MR. Left atrium was moderately dilated. PA peak pressure: 53 mm Hg    Neuro/Psych PSYCHIATRIC DISORDERS Anxiety Depression  Diplopia  TIACVA, No Residual Symptoms    GI/Hepatic negative GI ROS, Neg liver ROS,   Endo/Other  diabetesMorbid obesity  Renal/GU CRFRenal disease     Musculoskeletal  (+) Arthritis ,   Abdominal   Peds  Hematology negative hematology ROS (+)   Anesthesia Other Findings   Reproductive/Obstetrics                            Anesthesia Physical  Anesthesia Plan  ASA:  III  Anesthesia Plan: General   Post-op Pain Management:    Induction: Intravenous  PONV Risk Score and Plan: 3 and TIVA and Treatment may vary due to age or medical condition  Airway Management Planned: Mask  Additional Equipment: None  Intra-op Plan:   Post-operative Plan:   Informed Consent: I have reviewed the patients History and Physical, chart, labs and discussed the procedure including the risks, benefits and alternatives for the proposed anesthesia with the patient or authorized representative who has indicated his/her understanding and acceptance.       Plan Discussed with:   Anesthesia Plan Comments:         Anesthesia Quick Evaluation

## 2020-01-22 NOTE — Anesthesia Postprocedure Evaluation (Signed)
Anesthesia Post Note  Patient: [name]  Procedure(s) Performed: CARDIOVERSION (N/A )     Patient location during evaluation: Endoscopy Anesthesia Type: General Level of consciousness: awake and alert Pain management: pain level controlled Vital Signs Assessment: post-procedure vital signs reviewed and stable Respiratory status: spontaneous breathing, nonlabored ventilation and respiratory function stable Cardiovascular status: blood pressure returned to baseline and stable Postop Assessment: no apparent nausea or vomiting Anesthetic complications: no   No complications documented.  Last Vitals:  Vitals:   01/22/20 0944 01/22/20 0952  BP: 106/85 (!) 149/90  Pulse: 80 79  Resp: 19 (!) 26  Temp:    SpO2: 94% 95%    Last Pain:  Vitals:   01/22/20 0952  TempSrc:   PainSc: 0-No pain                 Lidia Collum

## 2020-01-23 ENCOUNTER — Encounter (HOSPITAL_COMMUNITY): Payer: Self-pay | Admitting: Cardiovascular Disease

## 2020-01-28 ENCOUNTER — Encounter: Payer: Self-pay | Admitting: Internal Medicine

## 2020-01-28 ENCOUNTER — Ambulatory Visit (INDEPENDENT_AMBULATORY_CARE_PROVIDER_SITE_OTHER): Payer: PPO | Admitting: Internal Medicine

## 2020-01-28 ENCOUNTER — Other Ambulatory Visit: Payer: Self-pay

## 2020-01-28 VITALS — BP 138/76 | HR 90 | Temp 98.7°F | Ht 65.0 in | Wt 273.2 lb

## 2020-01-28 DIAGNOSIS — E785 Hyperlipidemia, unspecified: Secondary | ICD-10-CM | POA: Diagnosis not present

## 2020-01-28 DIAGNOSIS — R7303 Prediabetes: Secondary | ICD-10-CM | POA: Diagnosis not present

## 2020-01-28 DIAGNOSIS — I10 Essential (primary) hypertension: Secondary | ICD-10-CM | POA: Diagnosis not present

## 2020-01-28 DIAGNOSIS — Z79899 Other long term (current) drug therapy: Secondary | ICD-10-CM | POA: Diagnosis not present

## 2020-01-28 MED ORDER — FLUOXETINE HCL 20 MG PO CAPS: ORAL_CAPSULE | ORAL | 1 refills | Status: DC

## 2020-01-28 MED ORDER — NYSTATIN-TRIAMCINOLONE 100000-0.1 UNIT/GM-% EX CREA: 1.0000 "application " | TOPICAL_CREAM | Freq: Two times a day (BID) | CUTANEOUS | 1 refills | Status: DC | PRN

## 2020-01-28 NOTE — Patient Instructions (Addendum)
Lab today Will refill prozac FU  depending  Or 6 months .  Get with pulmonary also about the breathing as well as cardiology .

## 2020-01-28 NOTE — Progress Notes (Signed)
Chief Complaint  Patient presents with  . Medication Refill    Doing okay    HPI: [name] 74 y.o. come in for Chronic disease management  Last  seen by me July 2020 Needs refills  prozac  20 mg   Ok with inc dose  Seems "ok"     bday anniversary of moms death  Just  Admitted for CV  Atrial flutter  n [facility] cardiology  Pulmonary  But didn't  Sustain  Her rhytym.  Sees pumonary once a year  Feels sob with active No new vision changes injury neuro sx.  ROS: See pertinent positives and negatives per HPI. dyspnea  Thinks may need  O2 supp since gst dysp on exertion   On cpap at night  Slight irritatied  axill can she use nystatin tmc cream? Vaccine   Still tries to stay away from risk areas  Past Medical History:  Diagnosis Date  . Anticoagulant long-term use    pradaxa  . Anxiety   . Arthritis    "fingers, lower back" (04/23/2017   . CAD (coronary artery disease) 4097,3532   post PTCA with bare-metal stenting to mid RCA in December 2004     . CHF (congestive heart failure) ([place])   . Chronic atrial fibrillation ([place]) 06/2007   Tachybradycardia pacemaker  . Chronic kidney disease    10% function - ?R, other kidney is compensating    . CVA (cerebral vascular accident) [facility]) [id number]   denies residual on 04/23/2017  . Depression   . Diplopia 06/19/2008   Qualifier: Diagnosis of  By: [name] MD, [name]   . Dysrhythmia    ATRIAL FIBRILATION  . Edema of lower extremity   . Hyperlipidemia   . Hypertension   . Inferior myocardial infarction [facility])    acute inferior wall mi/other medical hx  . Myocardial infarction ([place]) [id number]  . Obesity   . OSA on CPAP    last test- 2010  . Pacemaker   . Pneumonia 2014   tx. ----  [facility]  . Pulmonary hypertension ([place])    moderate pulmonary hypertension by 10/2016 echo and 10/2013 cardiac cath  . Shortness of breath   . Skin cancer    "cut off right [place]; burned off LLE" (04/23/2017)  . Sleep apnea   .  Spondylolisthesis   . TIA (transient ischemic attack) 2008  . Unspecified hemorrhoids without mention of complication 10/19/6220   Colonoscopy--Dr. Carlean Purl     Family History  Problem Relation Age of Onset  . Suicidality Father        suicide death pt was 3 yrs, 57  . Arrhythmia Mother   . Hypertension Mother   . Diabetes Mother   . Dementia Mother   . Heart attack Brother   . Heart disease Paternal Aunt   . Prostate cancer Maternal Grandfather   . Diabetes Paternal Grandfather        fathers side of the family  . Colon cancer Maternal Aunt     Social History   Socioeconomic History  . Marital status: Married    Spouse name: Not on file  . Number of children: 0  . Years of education: HS  . Highest education level: Not on file  Occupational History  . Occupation: retired    Comment: previously worked [employer]  . Smoking status: Former Smoker    Packs/day: 1.00    Years: 5.00    Pack years: 5.00  Types: Cigarettes    Start date: 05/11/1978    Quit date: 07/03/1982    Years since quitting: 37.5  . Smokeless tobacco: Never Used  Vaping Use  . Vaping Use: Never used  Substance and Sexual Activity  . Alcohol use: Yes    Comment: 04/23/2017 "once q 6 months; glass of wine"  . Drug use: No  . Sexual activity: Not Currently  Other Topics Concern  . 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   Married    Originally from [place] of two, high school education   Former smoker Pension scheme manager dogs 7    Retired from [employer] 2    Social Determinants of Radio broadcast assistant Strain:   . Difficulty of Paying Living Expenses:   Food Insecurity:   . Worried About Charity fundraiser in the Last Year:   . Arboriculturist in the Last Year:   Transportation Needs: No Transportation Needs  . Lack of Transportation (Medical): No  . Lack of  Transportation (Non-Medical): No  Physical Activity:   . Days of Exercise per Week:   . Minutes of Exercise per Session:   Stress:   . Feeling of Stress :   Social Connections:   . Frequency of Communication with Friends and Family:   . Frequency of Social Gatherings with Friends and Family:   . Attends Religious Services:   . Active Member of Clubs or Organizations:   . Attends Archivist Meetings:   [name] Marital Status:     Outpatient Medications Prior to Visit  Medication Sig Dispense Refill  . acetaminophen (TYLENOL) 500 MG tablet Take 1,000 mg by mouth daily.     . Ascorbic Acid (VITAMIN C) 1000 MG tablet Take 1,000 mg by mouth daily.     [name] atorvastatin (LIPITOR) 40 MG tablet TAKE 1 TABLET BY MOUTH EVERY DAY (Patient taking differently: Take 40 mg by mouth daily. ) 90 tablet 3  . Calcium Carbonate-Vitamin D (CALCIUM 600+D PO) Take 1 tablet by mouth daily.     . Cholecalciferol (VITAMIN D3) 125 MCG (5000 UT) TABS Take 5,000 Units by mouth daily.     . Cyanocobalamin (B-12) 5000 MCG CAPS Take 5,000 mcg by mouth daily.    [name] diltiazem (CARDIZEM CD) 240 MG 24 hr capsule TAKE 1 CAPSULE BY MOUTH EVERY DAY **DOSE INCREASE** (Patient taking differently: Take 240 mg by mouth daily. ) 90 capsule 2  . diphenhydramine-acetaminophen (TYLENOL PM) 25-500 MG TABS tablet Take 2 tablets by mouth at bedtime.     [name] ELIQUIS 5 MG TABS tablet TAKE 1 TABLET BY MOUTH TWICE A DAY (Patient taking differently: Take 5 mg by mouth 2 (two) times daily. ) 180 tablet 1  . fluticasone (FLONASE) 50 MCG/ACT nasal spray Place 1 spray into both nostrils daily as needed for allergies.     [name] KLOR-CON M20 20 MEQ tablet TAKE 1 TABLET BY MOUTH TWICE A DAY (Patient taking differently: Take 20 mEq by mouth 2 (two) times daily. ) 60 tablet 0  . loratadine (CLARITIN) 10 MG tablet TAKE 1 TABLET BY MOUTH EVERY DAY (Patient taking differently: Take 10 mg by mouth daily. ) 90 tablet 1  . Lysine 1000 MG TABS Take 1,000 mg by  mouth daily.     . Multiple Vitamin (MULTIVITAMIN WITH MINERALS) TABS  tablet Take 1 tablet by mouth daily. Centrum    . nitroGLYCERIN (NITROSTAT) 0.4 MG SL tablet PLACE 1 TABLET (0.4 MG TOTAL) UNDER THE TONGUE EVERY 5 (FIVE) MINUTES X 3 DOSES AS NEEDED FOR CHEST PAIN. 75 tablet 1  . omeprazole (PRILOSEC) 20 MG capsule Take 20 mg by mouth daily before breakfast.     . Polyethyl Glycol-Propyl Glycol (SYSTANE) 0.4-0.3 % SOLN Place 1 drop into both eyes 2 (two) times daily.     [name] torsemide (DEMADEX) 20 MG tablet Take 2 tablets (40 mg total) by mouth 2 (two) times daily. 360 tablet 3  . valACYclovir (VALTREX) 1000 MG tablet Take 2 tablets (2,000 mg total) by mouth every 12 (twelve) hours as needed (fever blisters/cold sores.). 30 tablet 1  . vitamin E 1000 UNIT capsule Take 1,000 Units by mouth daily.     [name] FLUoxetine (PROZAC) 20 MG capsule TAKE 1 CAPSULE BY MOUTH DAILY. PLEASE SCHEDULE FOLLOW UP FOR REFILLS. [phone] THANK YOU! 15 capsule 0   No facility-administered medications prior to visit.     EXAM:  BP (!) 138/76   Pulse 90   Temp 98.7 F (37.1 C) (Oral)   Ht 5\' 5"  (1.651 m)   Wt (!) 273 lb 3.2 oz (123.9 kg)   SpO2 94%   BMI 45.46 kg/m   Body mass index is 45.46 kg/m.  GENERAL: vitals reviewed and listed above, alert, oriented, appears well hydrated and in no acute distress HEENT: atraumatic, conjunctiva  clear, no obvious abnormalities on inspection of external nose and ears OP : masked   NECK: no obvious masses on inspection palpation  LUNGS: clear to auscultation bilaterally, no wheezes, rales or rhonchi, good air movement CV: HRRR?, no clubbing cyanosis 1+ chronic changes  peripheral edema nl cap refill  slin mild erythema axilla no satellite  MS: moves all extremities without noticeable focal  abnormality PSYCH: pleasant and cooperative, no obvious depression or anxiety Diabetic Foot Exam - Simple   Simple Foot Form Diabetic Foot exam was performed with the following  findings: Yes 01/28/2020  2:23 PM  Visual Inspection No deformities, no ulcerations, no other skin breakdown bilaterally: Yes Sensation Testing Intact to touch and monofilament testing bilaterally: Yes Pulse Check Posterior Tibialis and Dorsalis pulse intact bilaterally: Yes Comments Some flaking  No ulcers     Lab Results  Component Value Date   WBC 8.5 01/16/2020   HGB 15.3 (H) 01/22/2020   HCT 45.0 01/22/2020   PLT 236 01/16/2020   GLUCOSE 143 (H) 01/22/2020   CHOL 151 04/10/2019   TRIG 160.0 (H) 04/10/2019   HDL 50.80 04/10/2019   LDLCALC 68 04/10/2019   ALT 40 (H) 04/10/2019   AST 38 (H) 04/10/2019   NA 140 01/22/2020   K 3.7 01/22/2020   CL 99 01/22/2020   CREATININE 0.90 01/22/2020   BUN 22 01/22/2020   CO2 22 01/16/2020   TSH 3.48 04/10/2019   INR 1.0 05/08/2017   HGBA1C 6.9 (H) 04/10/2019   MICROALBUR <0.7 01/23/2017   BP Readings from Last 3 Encounters:  01/28/20 (!) 138/76  01/22/20 (!) 149/90  01/16/20 (!) 146/88    ASSESSMENT AND PLAN:  Discussed the following assessment and plan:  Pre-diabetes - Plan: Hemoglobin A1c, Hepatic function panel, TSH, TSH, Hepatic function panel, Hemoglobin A1c  Medication management - Plan: Hemoglobin A1c, Hepatic function panel, TSH, TSH, Hepatic function panel, Hemoglobin A1c  Essential hypertension - Plan: Hemoglobin A1c, Hepatic function panel, TSH, TSH, Hepatic function panel, Hemoglobin  A1c  Hyperlipidemia, unspecified hyperlipidemia type - Plan: Hemoglobin A1c, Hepatic function panel, TSH, TSH, Hepatic function panel, Hemoglobin A1c Lab plan   Today  Refill fluoxetine  And cram  Nystatin tmv  Uses for intertrigo  -Patient advised to return or notify health care team  if  new concerns arise.  Patient Instructions  Lab today Will refill prozac FU  depending  Or 6 months .  Get with pulmonary also about the breathing as well as cardiology .       [name]. Shailen Thielen M.D.

## 2020-01-29 ENCOUNTER — Telehealth: Payer: Self-pay | Admitting: Pharmacist

## 2020-01-29 ENCOUNTER — Ambulatory Visit (HOSPITAL_COMMUNITY)
Admission: RE | Admit: 2020-01-29 | Discharge: 2020-01-29 | Disposition: A | Discharge status: Home / Self Care | Payer: PPO | Source: Ambulatory Visit | Attending: Nurse Practitioner | Admitting: Nurse Practitioner

## 2020-01-29 ENCOUNTER — Encounter (HOSPITAL_COMMUNITY): Payer: Self-pay | Admitting: Nurse Practitioner

## 2020-01-29 ENCOUNTER — Telehealth: Payer: Self-pay | Admitting: Internal Medicine

## 2020-01-29 VITALS — BP 146/78 | HR 117 | Ht 65.0 in | Wt 273.2 lb

## 2020-01-29 DIAGNOSIS — I252 Old myocardial infarction: Secondary | ICD-10-CM | POA: Diagnosis not present

## 2020-01-29 DIAGNOSIS — Z8249 Family history of ischemic heart disease and other diseases of the circulatory system: Secondary | ICD-10-CM | POA: Insufficient documentation

## 2020-01-29 DIAGNOSIS — I484 Atypical atrial flutter: Secondary | ICD-10-CM | POA: Diagnosis not present

## 2020-01-29 DIAGNOSIS — I5032 Chronic diastolic (congestive) heart failure: Secondary | ICD-10-CM | POA: Insufficient documentation

## 2020-01-29 DIAGNOSIS — N189 Chronic kidney disease, unspecified: Secondary | ICD-10-CM | POA: Diagnosis not present

## 2020-01-29 DIAGNOSIS — I2510 Atherosclerotic heart disease of native coronary artery without angina pectoris: Secondary | ICD-10-CM | POA: Insufficient documentation

## 2020-01-29 DIAGNOSIS — F329 Major depressive disorder, single episode, unspecified: Secondary | ICD-10-CM | POA: Diagnosis not present

## 2020-01-29 DIAGNOSIS — I4892 Unspecified atrial flutter: Secondary | ICD-10-CM | POA: Insufficient documentation

## 2020-01-29 DIAGNOSIS — G4733 Obstructive sleep apnea (adult) (pediatric): Secondary | ICD-10-CM | POA: Diagnosis not present

## 2020-01-29 DIAGNOSIS — Z7901 Long term (current) use of anticoagulants: Secondary | ICD-10-CM | POA: Insufficient documentation

## 2020-01-29 DIAGNOSIS — D6869 Other thrombophilia: Secondary | ICD-10-CM

## 2020-01-29 DIAGNOSIS — Z79899 Other long term (current) drug therapy: Secondary | ICD-10-CM | POA: Diagnosis not present

## 2020-01-29 DIAGNOSIS — F419 Anxiety disorder, unspecified: Secondary | ICD-10-CM | POA: Insufficient documentation

## 2020-01-29 DIAGNOSIS — E785 Hyperlipidemia, unspecified: Secondary | ICD-10-CM | POA: Insufficient documentation

## 2020-01-29 DIAGNOSIS — Z87891 Personal history of nicotine dependence: Secondary | ICD-10-CM | POA: Insufficient documentation

## 2020-01-29 DIAGNOSIS — Z8673 Personal history of transient ischemic attack (TIA), and cerebral infarction without residual deficits: Secondary | ICD-10-CM | POA: Insufficient documentation

## 2020-01-29 DIAGNOSIS — I130 Hypertensive heart and chronic kidney disease with heart failure and stage 1 through stage 4 chronic kidney disease, or unspecified chronic kidney disease: Secondary | ICD-10-CM | POA: Insufficient documentation

## 2020-01-29 DIAGNOSIS — I480 Paroxysmal atrial fibrillation: Secondary | ICD-10-CM | POA: Diagnosis not present

## 2020-01-29 LAB — HEMOGLOBIN A1C
Hgb A1c MFr Bld: 6.8 % of total Hgb — ABNORMAL HIGH (ref <5.7)
Mean Plasma Glucose: 148 (calc)
eAG (mmol/L): 8.2 (calc)

## 2020-01-29 LAB — HEPATIC FUNCTION PANEL
AG Ratio: 1.3 (calc) (ref 1.0–2.5)
ALT: 28 U/L (ref 6–29)
AST: 33 U/L (ref 10–35)
Albumin: 4.2 g/dL (ref 3.6–5.1)
Alkaline phosphatase (APISO): 118 U/L (ref 37–153)
Bilirubin, Direct: 0.1 mg/dL (ref 0.0–0.2)
Globulin: 3.3 g/dL (calc) (ref 1.9–3.7)
Indirect Bilirubin: 0.4 mg/dL (calc) (ref 0.2–1.2)
Total Bilirubin: 0.5 mg/dL (ref 0.2–1.2)
Total Protein: 7.5 g/dL (ref 6.1–8.1)

## 2020-01-29 LAB — TSH: TSH: 2.77 mIU/L (ref 0.40–4.50)

## 2020-01-29 NOTE — Progress Notes (Signed)
Primary Care Physician: [name], MD Referring Physician:[name] is a 74 y.o. female with a h/o PPM, chronic diastolic HF,  paroxysmal  Afib/flutter  that is in the afib clinic for persistent  afib for the last 2 weeks. She  feels more shortness of breath and has noted increase in pedal edema. She has had 3 prior Ablations, most recent 03/14/2019.  She had had both covid shots and has not missed any anticoagulation with a CHA2DS2VASc of 3.   F/u in afib clinic, 7/30, she had a successful CV, 7/22. Unfortunately had ERAF. She has few choices going forward.. She had tikosyn in the past but it was stopped when she had an infection several years  ago, the pt thinks it was to treat pnumonia . She failed amio after [employer]. She had had 3 ablations and several cardioversion's. I discussed with Dr. Curt Bears. He is not in favor of repeat ablation, he suggested reloading of tikosyn. AV nodal ablation is a possibility as she already has a PPM, but he prefer this to be her last option. Her qt is at 471 ms today and she is on Prozac. She  will contact her PCP to see if she can be switched to Cymbalta, from Prozac,  preferred antidepressant with tikosyn. She is using benadryl now and this will need to be stopped.   Today, she denies symptoms of palpitations, chest pain, shortness of breath, orthopnea, PND, lower extremity edema, dizziness, presyncope, syncope, or neurologic sequela. The patient is tolerating medications without difficulties and is otherwise without complaint today.   Past Medical History:  Diagnosis Date  . Anticoagulant long-term use    pradaxa  . Anxiety   . Arthritis    "fingers, lower back" (04/23/2017   . CAD (coronary artery disease) 4008,6761   post PTCA with bare-metal stenting to mid RCA in December 2004     . CHF (congestive heart failure) ([place])   . Chronic atrial fibrillation ([place]) 06/2007   Tachybradycardia pacemaker  . Chronic kidney disease    10%  function - ?R, other kidney is compensating    . CVA (cerebral vascular accident) [facility]) [id number]   denies residual on 04/23/2017  . Depression   . Diplopia 06/19/2008   Qualifier: Diagnosis of  By: [name] MD, [name]   . Dysrhythmia    ATRIAL FIBRILATION  . Edema of lower extremity   . Hyperlipidemia   . Hypertension   . Inferior myocardial infarction [facility])    acute inferior wall mi/other medical hx  . Myocardial infarction ([place]) [id number]  . Obesity   . OSA on CPAP    last test- 2010  . Pacemaker   . Pneumonia 2014   tx. ----  [facility]  . Pulmonary hypertension ([place])    moderate pulmonary hypertension by 10/2016 echo and 10/2013 cardiac cath  . Shortness of breath   . Skin cancer    "cut off right [place]; burned off LLE" (04/23/2017)  . Sleep apnea   . Spondylolisthesis   . TIA (transient ischemic attack) 2008  . Unspecified hemorrhoids without mention of complication 07/27/5807   Colonoscopy--Dr. Carlean Purl    Past Surgical History:  Procedure Laterality Date  . ABDOMINAL HYSTERECTOMY    . APPENDECTOMY  1984  . ATRIAL FIBRILLATION ABLATION N/A 09/29/2016   Procedure: Atrial Fibrillation Ablation;  Surgeon: Will Meredith Leeds, MD;  Location: [place] CV LAB;  Service: Cardiovascular;  Laterality: N/A;  . ATRIAL FIBRILLATION ABLATION  N/A 02/07/2018   Procedure: ATRIAL FIBRILLATION ABLATION;  Surgeon: Constance Haw, MD;  Location: [place] CV LAB;  Service: Cardiovascular;  Laterality: N/A;  . ATRIAL FIBRILLATION ABLATION N/A 03/13/2019   Procedure: ATRIAL FIBRILLATION ABLATION;  Surgeon: Constance Haw, MD;  Location: [place] CV LAB;  Service: Cardiovascular;  Laterality: N/A;  . BACK SURGERY    . CARDIOVERSION N/A 09/12/2017   Procedure: CARDIOVERSION;  Surgeon: Jerline Pain, MD;  Location: [facility] ENDOSCOPY;  Service: Cardiovascular;  Laterality: N/A;  . CARDIOVERSION N/A 12/13/2017   Procedure: CARDIOVERSION;  Surgeon: Sanda Klein, MD;  Location:  [place] ENDOSCOPY;  Service: Cardiovascular;  Laterality: N/A;  . CARDIOVERSION N/A 02/18/2018   Procedure: CARDIOVERSION;  Surgeon: Dorothy Spark, MD;  Location: [facility] ENDOSCOPY;  Service: Cardiovascular;  Laterality: N/A;  . CARDIOVERSION N/A 05/20/2018   Procedure: CARDIOVERSION;  Surgeon: [name], MD;  Location: [facility] ENDOSCOPY;  Service: Cardiovascular;  Laterality: N/A;  . CARDIOVERSION N/A 04/16/2019   Procedure: CARDIOVERSION;  Surgeon: Donato Heinz, MD;  Location: [place];  Service: Endoscopy;  Laterality: N/A;  . CARDIOVERSION N/A 01/22/2020   Procedure: CARDIOVERSION;  Surgeon: Geralynn Rile, MD;  Location: [place];  Service: Cardiovascular;  Laterality: N/A;  . CATARACT EXTRACTION W/ INTRAOCULAR LENS  IMPLANT, BILATERAL Bilateral 01/15/2017- 03/2017  . CHOLECYSTECTOMY    . CORONARY ANGIOPLASTY  X 2  . CORONARY ANGIOPLASTY WITH STENT PLACEMENT  1998; ~ 2007; ?date   "1 stent; replaced stent; not sure when I got the last stent" (04/23/2017)  . DOPPLER ECHOCARDIOGRAPHY  2009  . ELECTROPHYSIOLOGIC STUDY N/A 03/31/2016   Procedure: Cardioversion;  Surgeon: Evans Lance, MD;  Location: [place] CV LAB;  Service: Cardiovascular;  Laterality: N/A;  . ELECTROPHYSIOLOGIC STUDY N/A 08/04/2016   Procedure: Cardioversion;  Surgeon: Evans Lance, MD;  Location: [place] CV LAB;  Service: Cardiovascular;  Laterality: N/A;  . INSERT / REPLACE / REMOVE PACEMAKER  06/2007  . IR RADIOLOGY PERIPHERAL GUIDED IV START  01/31/2018  . IR US GUIDE VASC ACCESS LEFT  01/31/2018  . JOINT REPLACEMENT    . LAPAROSCOPIC CHOLECYSTECTOMY  1994  . LEFT AND RIGHT HEART CATHETERIZATION WITH CORONARY ANGIOGRAM N/A 10/06/2013   Procedure: LEFT AND RIGHT HEART CATHETERIZATION WITH CORONARY ANGIOGRAM;  Surgeon: Troy Sine, MD;  Location: [facility] CATH LAB;  Service: Cardiovascular;  Laterality: N/A;  . LEFT OOPHORECTOMY Left ~ 1989  . POSTERIOR LUMBAR FUSION  2000s - 04/2015 X 3   L3-4;  L4-5; L2-3; Dr Trenton Gammon  . RIGHT/LEFT HEART CATH AND CORONARY ANGIOGRAPHY N/A 05/09/2017   Procedure: RIGHT/LEFT HEART CATH AND CORONARY ANGIOGRAPHY;  Surgeon: Sherren Mocha, MD;  Location: [place] CV LAB;  Service: Cardiovascular;  Laterality: N/A;  . SKIN CANCER EXCISION Right    [place]  . TEE WITHOUT CARDIOVERSION N/A 09/29/2016   Procedure: TRANSESOPHAGEAL ECHOCARDIOGRAM (TEE);  Surgeon: Jerline Pain, MD;  Location: [place];  Service: Cardiovascular;  Laterality: N/A;  . TOTAL ABDOMINAL HYSTERECTOMY  1984   "uterus & right ovary"  . TOTAL KNEE ARTHROPLASTY Left 04/23/2017  . TOTAL KNEE ARTHROPLASTY Left 04/23/2017   Procedure: TOTAL KNEE ARTHROPLASTY;  Surgeon: Frederik Pear, MD;  Location: [place];  Service: Orthopedics;  Laterality: Left;  . ULTRASOUND GUIDANCE FOR VASCULAR ACCESS  05/09/2017   Procedure: Ultrasound Guidance For Vascular Access;  Surgeon: Sherren Mocha, MD;  Location: [place] CV LAB;  Service: Cardiovascular;;    Current Outpatient Medications  Medication Sig Dispense Refill  .  acetaminophen (TYLENOL) 500 MG tablet Take 1,000 mg by mouth daily.     . Ascorbic Acid (VITAMIN C) 1000 MG tablet Take 1,000 mg by mouth daily.     [name] atorvastatin (LIPITOR) 40 MG tablet TAKE 1 TABLET BY MOUTH EVERY DAY (Patient taking differently: Take 40 mg by mouth daily. ) 90 tablet 3  . Calcium Carbonate-Vitamin D (CALCIUM 600+D PO) Take 1 tablet by mouth daily.     . Cholecalciferol (VITAMIN D3) 125 MCG (5000 UT) TABS Take 5,000 Units by mouth daily.     . Cyanocobalamin (B-12) 5000 MCG CAPS Take 5,000 mcg by mouth daily.    [name] diltiazem (CARDIZEM CD) 240 MG 24 hr capsule TAKE 1 CAPSULE BY MOUTH EVERY DAY **DOSE INCREASE** (Patient taking differently: Take 240 mg by mouth daily. ) 90 capsule 2  . diphenhydramine-acetaminophen (TYLENOL PM) 25-500 MG TABS tablet Take 2 tablets by mouth at bedtime.     [name] ELIQUIS 5 MG TABS tablet TAKE 1 TABLET BY MOUTH TWICE A DAY (Patient taking  differently: Take 5 mg by mouth 2 (two) times daily. ) 180 tablet 1  . FLUoxetine (PROZAC) 20 MG capsule TAKE 1 CAPSULE BY MOUTH DAILY. 90 capsule 1  . fluticasone (FLONASE) 50 MCG/ACT nasal spray Place 1 spray into both nostrils daily as needed for allergies.     [name] KLOR-CON M20 20 MEQ tablet TAKE 1 TABLET BY MOUTH TWICE A DAY (Patient taking differently: Take 20 mEq by mouth 2 (two) times daily. ) 60 tablet 0  . loratadine (CLARITIN) 10 MG tablet TAKE 1 TABLET BY MOUTH EVERY DAY (Patient taking differently: Take 10 mg by mouth daily. ) 90 tablet 1  . Lysine 1000 MG TABS Take 1,000 mg by mouth daily.     . Multiple Vitamin (MULTIVITAMIN WITH MINERALS) TABS tablet Take 1 tablet by mouth daily. Centrum    . nitroGLYCERIN (NITROSTAT) 0.4 MG SL tablet PLACE 1 TABLET (0.4 MG TOTAL) UNDER THE TONGUE EVERY 5 (FIVE) MINUTES X 3 DOSES AS NEEDED FOR CHEST PAIN. 75 tablet 1  . nystatin-triamcinolone (MYCOLOG II) cream Apply 1 application topically 2 (two) times daily as needed. 30 g 1  . omeprazole (PRILOSEC) 20 MG capsule Take 20 mg by mouth daily before breakfast.     . Polyethyl Glycol-Propyl Glycol (SYSTANE) 0.4-0.3 % SOLN Place 1 drop into both eyes 2 (two) times daily.     [name] torsemide (DEMADEX) 20 MG tablet Take 2 tablets (40 mg total) by mouth 2 (two) times daily. 360 tablet 3  . valACYclovir (VALTREX) 1000 MG tablet Take 2 tablets (2,000 mg total) by mouth every 12 (twelve) hours as needed (fever blisters/cold sores.). 30 tablet 1  . vitamin E 1000 UNIT capsule Take 1,000 Units by mouth daily.      No current facility-administered medications for this encounter.    Allergies  Allergen Reactions  . Adhesive [Tape] Rash and Other (See Comments)    Allergic to defibrillation pads.    Social History   Socioeconomic History  . Marital status: Married    Spouse name: Not on file  . Number of children: 0  . Years of education: HS  . Highest education level: Not on file  Occupational History  .  Occupation: retired    Comment: previously worked [employer]  . Smoking status: Former Smoker    Packs/day: 1.00    Years: 5.00    Pack years: 5.00    Types: Cigarettes  Start date: 05/11/1978    Quit date: 07/03/1982    Years since quitting: 37.6  . Smokeless tobacco: Never Used  Vaping Use  . Vaping Use: Never used  Substance and Sexual Activity  . Alcohol use: Yes    Comment: 04/23/2017 "once q 6 months; glass of wine"  . Drug use: No  . Sexual activity: Not Currently  Other Topics Concern  . 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   Married    Originally from [employer] of two, high school education   Former smoker Pension scheme manager dogs 7    Retired from [employer] 2    Social Determinants of Radio broadcast assistant Strain:   . Difficulty of Paying Living Expenses:   Food Insecurity:   . Worried About Charity fundraiser in the Last Year:   . Arboriculturist in the Last Year:   Transportation Needs: No Transportation Needs  . Lack of Transportation (Medical): No  . Lack of Transportation (Non-Medical): No  Physical Activity:   . Days of Exercise per Week:   . Minutes of Exercise per Session:   Stress:   . Feeling of Stress :   Social Connections:   . Frequency of Communication with Friends and Family:   . Frequency of Social Gatherings with Friends and Family:   . Attends Religious Services:   . Active Member of Clubs or Organizations:   . Attends Archivist Meetings:   [name] Marital Status:   Intimate Partner Violence:   . Fear of Current or Ex-Partner:   . Emotionally Abused:   [name] Physically Abused:   . Sexually Abused:     Family History  Problem Relation Age of Onset  . Suicidality Father        suicide death pt was 3 yrs, 52  . Arrhythmia Mother   . Hypertension Mother   . Diabetes Mother   . Dementia Mother   .  Heart attack Brother   . Heart disease Paternal Aunt   . Prostate cancer Maternal Grandfather   . Diabetes Paternal Grandfather        fathers side of the family  . Colon cancer Maternal Aunt     ROS- All systems are reviewed and negative except as per the HPI above  Physical Exam: Vitals:   01/29/20 1054  BP: (!) 146/78  Pulse: (!) 117  Weight: (!) 123.9 kg  Height: 5\' 5"  (1.651 m)   Wt Readings from Last 3 Encounters:  01/29/20 (!) 123.9 kg  01/28/20 (!) 123.9 kg  01/22/20 124.9 kg    Labs: Lab Results  Component Value Date   NA 140 01/22/2020   K 3.7 01/22/2020   CL 99 01/22/2020   CO2 22 01/16/2020   GLUCOSE 143 (H) 01/22/2020   BUN 22 01/22/2020   CREATININE 0.90 01/22/2020   CALCIUM 8.9 01/16/2020   PHOS 4.0 06/01/2018   MG 2.3 06/01/2018   Lab Results  Component Value Date   INR 1.0 05/08/2017   Lab Results  Component Value Date   CHOL 151 04/10/2019   HDL 50.80 04/10/2019   LDLCALC 68 04/10/2019   TRIG 160.0 (H) 04/10/2019     GEN- The patient is well appearing, alert and oriented x 3 today.   Head- normocephalic, atraumatic Eyes-  Sclera clear, conjunctiva pink  Ears- hearing intact Oropharynx- clear Neck- supple, no JVP Lymph- no cervical lymphadenopathy Lungs- Clear to ausculation bilaterally, normal work of breathing Heart- irregular rate and rhythm, no murmurs, rubs or gallops, PMI not laterally displaced GI- soft, NT, ND, + BS Extremities- no clubbing, cyanosis, or edema MS- no significant deformity or atrophy Skin- no rash or lesion Psych- euthymic mood, full affect Neuro- strength and sensation are intact  EKG-atrial flutter with variable block vrs a fib at 117 bpm   Assessment and Plan: 1. Persistent  atrial flutter  Recent cardioversion with ERAF  After discussion with Dr. Curt Bears, will try to  re load tikosyn Recommend to stop benadryl sleep aid Will discuss with her PCP and see if she can be transitioned form Prozac to  Cymbalta since her qt is not quite optimal for start of Tikosyn  She will check with her drug card to look at price of drug  Continue  diltiazem 240 daily  2. CHA2DS2VASc score of 3 Continue eliquis 5 mg bid  States no missed doses for at least 3 weeks   3. Diastolic HF She has a tendency to have more issues with being out of rhythm  Watch weight and leg edema Torsemide  40 mg bid   She will let us know when she is ready to schedule, looking tentatively at 8/16  [place]. Chrishelle Zito, [facility] [address] [place], [place] [address] [phone]

## 2020-01-29 NOTE — Progress Notes (Signed)
Labs stable  and a 1 c is 6.8 and ok  range  Plan rov in 6 mos and will recheck   Liver tests are also good this time

## 2020-01-29 NOTE — Telephone Encounter (Signed)
Medication list reviewed in anticipation of upcoming Tikosyn initiation. Patient is not taking any contraindicated medications. She is taking QTc prolonging medications. QTc should be monitored closely with the use of fluoxetine. K and Mg should be monitored closely and should remain 4 or higher or 2 or higher respectively.  Patient is anticoagulated on Eliquis on the appropriate dose. Please ensure that patient has not missed any anticoagulation doses in the 3 weeks prior to Tikosyn initiation.   Patient will need to be counseled to avoid use of Benadryl while on Tikosyn and in the 2-3 days prior to Tikosyn initiation. This includes the diphenhydramine and tylenol (Tylenol PM).

## 2020-01-29 NOTE — Telephone Encounter (Signed)
The patients cardiologist Dr. Curt Bears from [facility] is wanting to put her in the hospital to put her on a new medicine of Tikosyn.  Her heart is irregular and she is in AFIB.  They want to know if the can change hr medication to Cymbalta from FLUoxetine (PROZAC) 20 MG capsule before putting her in the hospital on 02/16/2020.  You can contact Roderic Palau [phone] at the [facility] to let her know if it is okay to switch her medications.  Please update the patient if there is a change also.

## 2020-01-30 ENCOUNTER — Other Ambulatory Visit: Payer: Self-pay

## 2020-01-30 MED ORDER — DULOXETINE HCL 60 MG PO CPEP
60.0000 mg | ORAL_CAPSULE | Freq: Every day | ORAL | 0 refills | Status: DC
Start: 2020-01-30 — End: 2020-04-26

## 2020-01-30 NOTE — Telephone Encounter (Signed)
Called AFIB Clinic and spoke to Juarez and gave him the message from Dr. [name]. [name] verbalized an understanding and stated that he will call the patient to go over the change of medication and I have sent in a new Rx of the Cymbalta 60mg  for [facility] [address].

## 2020-01-30 NOTE — Telephone Encounter (Signed)
Please see message.  Please advise. 

## 2020-01-30 NOTE — Telephone Encounter (Signed)
Should be ok to do this    [place] for y ou to order this as you see fit    Should be able to do a straight switch from 20  fluoxetine to  60 of cymbalta

## 2020-02-02 ENCOUNTER — Other Ambulatory Visit (HOSPITAL_COMMUNITY): Payer: Self-pay | Admitting: *Deleted

## 2020-02-02 NOTE — Telephone Encounter (Signed)
Received notice that pt is changing from fluoxetine to Cymbalta which will avoid the interaction with Tikosyn.   Pt does have Tylenol PM on her med list which she will need to stop since it contains Benadryl.  No other med issues noted.

## 2020-02-04 DIAGNOSIS — G4733 Obstructive sleep apnea (adult) (pediatric): Secondary | ICD-10-CM | POA: Diagnosis not present

## 2020-02-06 ENCOUNTER — Other Ambulatory Visit: Payer: Self-pay | Admitting: Cardiovascular Disease

## 2020-02-13 ENCOUNTER — Telehealth (HOSPITAL_COMMUNITY): Payer: Self-pay

## 2020-02-13 ENCOUNTER — Other Ambulatory Visit (HOSPITAL_COMMUNITY)
Admission: RE | Admit: 2020-02-13 | Discharge: 2020-02-13 | Disposition: A | Discharge status: Home / Self Care | Payer: PPO | Source: Ambulatory Visit | Attending: Physician Assistant | Admitting: Physician Assistant

## 2020-02-13 DIAGNOSIS — I4892 Unspecified atrial flutter: Secondary | ICD-10-CM | POA: Diagnosis not present

## 2020-02-13 DIAGNOSIS — Z85828 Personal history of other malignant neoplasm of skin: Secondary | ICD-10-CM | POA: Diagnosis not present

## 2020-02-13 DIAGNOSIS — I2510 Atherosclerotic heart disease of native coronary artery without angina pectoris: Secondary | ICD-10-CM | POA: Diagnosis not present

## 2020-02-13 DIAGNOSIS — Z96652 Presence of left artificial knee joint: Secondary | ICD-10-CM | POA: Diagnosis not present

## 2020-02-13 DIAGNOSIS — Z955 Presence of coronary angioplasty implant and graft: Secondary | ICD-10-CM | POA: Diagnosis not present

## 2020-02-13 DIAGNOSIS — Z7901 Long term (current) use of anticoagulants: Secondary | ICD-10-CM | POA: Diagnosis not present

## 2020-02-13 DIAGNOSIS — Z01812 Encounter for preprocedural laboratory examination: Secondary | ICD-10-CM | POA: Insufficient documentation

## 2020-02-13 DIAGNOSIS — Z20822 Contact with and (suspected) exposure to covid-19: Secondary | ICD-10-CM | POA: Insufficient documentation

## 2020-02-13 DIAGNOSIS — F419 Anxiety disorder, unspecified: Secondary | ICD-10-CM | POA: Diagnosis not present

## 2020-02-13 DIAGNOSIS — Z950 Presence of cardiac pacemaker: Secondary | ICD-10-CM | POA: Diagnosis not present

## 2020-02-13 DIAGNOSIS — G4733 Obstructive sleep apnea (adult) (pediatric): Secondary | ICD-10-CM | POA: Diagnosis not present

## 2020-02-13 DIAGNOSIS — Z87891 Personal history of nicotine dependence: Secondary | ICD-10-CM | POA: Diagnosis not present

## 2020-02-13 DIAGNOSIS — I252 Old myocardial infarction: Secondary | ICD-10-CM | POA: Diagnosis not present

## 2020-02-13 DIAGNOSIS — I4819 Other persistent atrial fibrillation: Secondary | ICD-10-CM | POA: Diagnosis not present

## 2020-02-13 DIAGNOSIS — I272 Pulmonary hypertension, unspecified: Secondary | ICD-10-CM | POA: Diagnosis not present

## 2020-02-13 DIAGNOSIS — F329 Major depressive disorder, single episode, unspecified: Secondary | ICD-10-CM | POA: Diagnosis not present

## 2020-02-13 DIAGNOSIS — E785 Hyperlipidemia, unspecified: Secondary | ICD-10-CM | POA: Diagnosis not present

## 2020-02-13 DIAGNOSIS — K219 Gastro-esophageal reflux disease without esophagitis: Secondary | ICD-10-CM | POA: Diagnosis not present

## 2020-02-13 DIAGNOSIS — I484 Atypical atrial flutter: Secondary | ICD-10-CM | POA: Diagnosis not present

## 2020-02-13 DIAGNOSIS — Z9049 Acquired absence of other specified parts of digestive tract: Secondary | ICD-10-CM | POA: Diagnosis not present

## 2020-02-13 DIAGNOSIS — Z79899 Other long term (current) drug therapy: Secondary | ICD-10-CM | POA: Diagnosis not present

## 2020-02-13 DIAGNOSIS — J9601 Acute respiratory failure with hypoxia: Secondary | ICD-10-CM | POA: Diagnosis not present

## 2020-02-13 DIAGNOSIS — Z6841 Body Mass Index (BMI) 40.0 and over, adult: Secondary | ICD-10-CM | POA: Diagnosis not present

## 2020-02-13 DIAGNOSIS — I5032 Chronic diastolic (congestive) heart failure: Secondary | ICD-10-CM | POA: Diagnosis not present

## 2020-02-13 DIAGNOSIS — I130 Hypertensive heart and chronic kidney disease with heart failure and stage 1 through stage 4 chronic kidney disease, or unspecified chronic kidney disease: Secondary | ICD-10-CM | POA: Diagnosis not present

## 2020-02-13 DIAGNOSIS — Z8673 Personal history of transient ischemic attack (TIA), and cerebral infarction without residual deficits: Secondary | ICD-10-CM | POA: Diagnosis not present

## 2020-02-13 LAB — SARS CORONAVIRUS 2 (TAT 6-24 HRS): SARS Coronavirus 2: NEGATIVE

## 2020-02-13 NOTE — Telephone Encounter (Signed)
Patient called in regarding her CPAP. She was wondering if the hospital will provide her a CPAP machine or should she bring her machine from home. Advised patient to bring her CPAP machine from home. Consulted with patient and she understood.

## 2020-02-16 ENCOUNTER — Other Ambulatory Visit: Payer: Self-pay

## 2020-02-16 ENCOUNTER — Ambulatory Visit (HOSPITAL_COMMUNITY)
Admission: RE | Admit: 2020-02-16 | Discharge: 2020-02-16 | Disposition: A | Discharge status: Home / Self Care | Payer: PPO | Source: Ambulatory Visit | Attending: Physician Assistant | Admitting: Physician Assistant

## 2020-02-16 ENCOUNTER — Inpatient Hospital Stay (HOSPITAL_COMMUNITY)
Admission: AD | Admit: 2020-02-16 | Discharge: 2020-02-19 | DRG: 309 | Disposition: A | Discharge status: Home / Self Care | Payer: PPO | Source: Ambulatory Visit | Attending: Cardiology | Admitting: Cardiology

## 2020-02-16 VITALS — BP 154/76 | HR 112 | Ht 65.0 in | Wt 271.4 lb

## 2020-02-16 DIAGNOSIS — Z79899 Other long term (current) drug therapy: Secondary | ICD-10-CM | POA: Diagnosis not present

## 2020-02-16 DIAGNOSIS — G4733 Obstructive sleep apnea (adult) (pediatric): Secondary | ICD-10-CM | POA: Diagnosis present

## 2020-02-16 DIAGNOSIS — Z9049 Acquired absence of other specified parts of digestive tract: Secondary | ICD-10-CM

## 2020-02-16 DIAGNOSIS — F329 Major depressive disorder, single episode, unspecified: Secondary | ICD-10-CM | POA: Diagnosis present

## 2020-02-16 DIAGNOSIS — Z950 Presence of cardiac pacemaker: Secondary | ICD-10-CM | POA: Diagnosis not present

## 2020-02-16 DIAGNOSIS — I2510 Atherosclerotic heart disease of native coronary artery without angina pectoris: Secondary | ICD-10-CM | POA: Diagnosis not present

## 2020-02-16 DIAGNOSIS — Z7901 Long term (current) use of anticoagulants: Secondary | ICD-10-CM

## 2020-02-16 DIAGNOSIS — Z955 Presence of coronary angioplasty implant and graft: Secondary | ICD-10-CM

## 2020-02-16 DIAGNOSIS — K219 Gastro-esophageal reflux disease without esophagitis: Secondary | ICD-10-CM | POA: Diagnosis not present

## 2020-02-16 DIAGNOSIS — Z96652 Presence of left artificial knee joint: Secondary | ICD-10-CM | POA: Diagnosis present

## 2020-02-16 DIAGNOSIS — Z85828 Personal history of other malignant neoplasm of skin: Secondary | ICD-10-CM

## 2020-02-16 DIAGNOSIS — E785 Hyperlipidemia, unspecified: Secondary | ICD-10-CM | POA: Diagnosis not present

## 2020-02-16 DIAGNOSIS — I252 Old myocardial infarction: Secondary | ICD-10-CM | POA: Diagnosis not present

## 2020-02-16 DIAGNOSIS — I4819 Other persistent atrial fibrillation: Secondary | ICD-10-CM | POA: Diagnosis not present

## 2020-02-16 DIAGNOSIS — Z8673 Personal history of transient ischemic attack (TIA), and cerebral infarction without residual deficits: Secondary | ICD-10-CM

## 2020-02-16 DIAGNOSIS — I5032 Chronic diastolic (congestive) heart failure: Secondary | ICD-10-CM | POA: Diagnosis not present

## 2020-02-16 DIAGNOSIS — I4892 Unspecified atrial flutter: Secondary | ICD-10-CM | POA: Diagnosis not present

## 2020-02-16 DIAGNOSIS — N1831 Chronic kidney disease, stage 3a: Secondary | ICD-10-CM | POA: Diagnosis present

## 2020-02-16 DIAGNOSIS — I484 Atypical atrial flutter: Secondary | ICD-10-CM | POA: Diagnosis present

## 2020-02-16 DIAGNOSIS — I272 Pulmonary hypertension, unspecified: Secondary | ICD-10-CM | POA: Diagnosis not present

## 2020-02-16 DIAGNOSIS — I130 Hypertensive heart and chronic kidney disease with heart failure and stage 1 through stage 4 chronic kidney disease, or unspecified chronic kidney disease: Secondary | ICD-10-CM | POA: Diagnosis present

## 2020-02-16 DIAGNOSIS — D6869 Other thrombophilia: Secondary | ICD-10-CM | POA: Insufficient documentation

## 2020-02-16 DIAGNOSIS — Z20822 Contact with and (suspected) exposure to covid-19: Secondary | ICD-10-CM | POA: Diagnosis present

## 2020-02-16 DIAGNOSIS — Z6841 Body Mass Index (BMI) 40.0 and over, adult: Secondary | ICD-10-CM

## 2020-02-16 DIAGNOSIS — Z87891 Personal history of nicotine dependence: Secondary | ICD-10-CM

## 2020-02-16 DIAGNOSIS — F419 Anxiety disorder, unspecified: Secondary | ICD-10-CM | POA: Diagnosis present

## 2020-02-16 DIAGNOSIS — J9601 Acute respiratory failure with hypoxia: Secondary | ICD-10-CM | POA: Diagnosis not present

## 2020-02-16 DIAGNOSIS — I495 Sick sinus syndrome: Secondary | ICD-10-CM | POA: Diagnosis present

## 2020-02-16 LAB — BASIC METABOLIC PANEL
Anion gap: 11 (ref 5–15)
BUN: 23 mg/dL (ref 8–23)
CO2: 28 mmol/L (ref 22–32)
Calcium: 9.5 mg/dL (ref 8.9–10.3)
Chloride: 99 mmol/L (ref 98–111)
Creatinine, Ser: 1.24 mg/dL — ABNORMAL HIGH (ref 0.44–1.00)
GFR calc Af Amer: 50 mL/min — ABNORMAL LOW (ref >60)
GFR calc non Af Amer: 43 mL/min — ABNORMAL LOW (ref >60)
Glucose, Bld: 159 mg/dL — ABNORMAL HIGH (ref 70–99)
Potassium: 4.5 mmol/L (ref 3.5–5.1)
Sodium: 138 mmol/L (ref 135–145)

## 2020-02-16 LAB — MAGNESIUM: Magnesium: 2.4 mg/dL (ref 1.7–2.4)

## 2020-02-16 MED ORDER — POTASSIUM CHLORIDE CRYS ER 20 MEQ PO TBCR
20.0000 meq | EXTENDED_RELEASE_TABLET | Freq: Two times a day (BID) | ORAL | Status: DC
  Filled 2020-02-16 (×2): qty 1

## 2020-02-16 MED ORDER — DOFETILIDE 250 MCG PO CAPS
250.0000 ug | ORAL_CAPSULE | Freq: Two times a day (BID) | ORAL | Status: DC
  Administered 2020-02-16: 250 ug via ORAL
  Filled 2020-02-16: qty 1

## 2020-02-16 MED ORDER — TORSEMIDE 20 MG PO TABS: 40.0000 mg | ORAL_TABLET | Freq: Two times a day (BID) | ORAL | Status: DC

## 2020-02-16 MED ORDER — FLUTICASONE PROPIONATE 50 MCG/ACT NA SUSP
1.0000 | Freq: Every day | NASAL | Status: DC | PRN
  Filled 2020-02-16: qty 16

## 2020-02-16 MED ORDER — SODIUM CHLORIDE 0.9% FLUSH
3.0000 mL | Freq: Two times a day (BID) | INTRAVENOUS | Status: DC
  Administered 2020-02-17 – 2020-02-19 (×3): 3 mL via INTRAVENOUS

## 2020-02-16 MED ORDER — APIXABAN 5 MG PO TABS
5.0000 mg | ORAL_TABLET | Freq: Two times a day (BID) | ORAL | Status: DC
  Administered 2020-02-16 – 2020-02-19 (×6): 5 mg via ORAL
  Filled 2020-02-16 (×6): qty 1

## 2020-02-16 MED ORDER — SODIUM CHLORIDE 0.9 % IV SOLN: 250.0000 mL | INTRAVENOUS | Status: DC | PRN

## 2020-02-16 MED ORDER — VALACYCLOVIR HCL 500 MG PO TABS: 2000.0000 mg | ORAL_TABLET | Freq: Two times a day (BID) | ORAL | Status: DC | PRN

## 2020-02-16 MED ORDER — PANTOPRAZOLE SODIUM 40 MG PO TBEC: 40.0000 mg | DELAYED_RELEASE_TABLET | Freq: Every day | ORAL | Status: DC

## 2020-02-16 MED ORDER — POLYETHYL GLYCOL-PROPYL GLYCOL 0.4-0.3 % OP SOLN: 1.0000 [drp] | Freq: Two times a day (BID) | OPHTHALMIC | Status: DC

## 2020-02-16 MED ORDER — DILTIAZEM HCL ER COATED BEADS 240 MG PO CP24: 240.0000 mg | ORAL_CAPSULE | Freq: Every day | ORAL | Status: DC

## 2020-02-16 MED ORDER — ATORVASTATIN CALCIUM 40 MG PO TABS: 40.0000 mg | ORAL_TABLET | Freq: Every day | ORAL | Status: DC

## 2020-02-16 MED ORDER — DULOXETINE HCL 60 MG PO CPEP: 60.0000 mg | ORAL_CAPSULE | Freq: Every day | ORAL | Status: DC

## 2020-02-16 MED ORDER — SODIUM CHLORIDE 0.9% FLUSH: 3.0000 mL | INTRAVENOUS | Status: DC | PRN

## 2020-02-16 MED ORDER — NITROGLYCERIN 0.4 MG SL SUBL: 0.4000 mg | SUBLINGUAL_TABLET | SUBLINGUAL | Status: DC | PRN

## 2020-02-16 MED ORDER — LORATADINE 10 MG PO TABS: 10.0000 mg | ORAL_TABLET | Freq: Every day | ORAL | Status: DC

## 2020-02-16 NOTE — H&P (Addendum)
Patient being admitted for Tikosyn load. She was seen by Malka So, PA-C, in the [facility] today. Please see his note below which serves as H&P. Potassium 4.5 and Magnesium 2.4. QTc 425 today.   Eppie Gibson 02/16/2020 7:33 PM      Primary Care Physician: [name], MD Referring Physician:[name] is a 74 y.o. female with a h/o PPM, chronic diastolic HF,  paroxysmal  Afib/flutter  that is in the afib clinic for persistent  afib for the last 2 weeks. She  feels more shortness of breath and has noted increase in pedal edema. She has had 3 prior Ablations, most recent 03/14/2019.  She had had both covid shots and has not missed any anticoagulation with a CHA2DS2VASc of 3.   F/u in afib clinic, 7/30, she had a successful CV, 7/22. Unfortunately had ERAF. She has few choices going forward.. She had tikosyn in the past but it was stopped when she had an infection several years  ago, the pt thinks it was to treat pnumonia . She failed amio after [employer]. She had had 3 ablations and several cardioversion's. I discussed with Dr. Curt Bears. He is not in favor of repeat ablation, he suggested reloading of tikosyn. AV nodal ablation is a possibility as she already has a PPM, but he prefer this to be her last option. Her qt is at 471 ms today and she is on Prozac. She  will contact her PCP to see if she can be switched to Cymbalta, from Prozac,  preferred antidepressant with tikosyn.   On follow up today, patient presents for dofetilide admission. She has changed from Prozac to Cymbalta and has not had any doses of Tylenol PM in the last 3 days. She denies any missed doses of anticoagulation in the last 3 weeks.   Today, she denies symptoms of palpitations, chest pain, shortness of breath, orthopnea, PND, lower extremity edema, dizziness, presyncope, syncope, or neurologic sequela. The patient is tolerating medications without difficulties and is  otherwise without complaint today.       Past Medical History:  Diagnosis Date  . Anticoagulant long-term use    pradaxa  . Anxiety   . Arthritis    "fingers, lower back" (04/23/2017   . CAD (coronary artery disease) 3662,9476   post PTCA with bare-metal stenting to mid RCA in December 2004     . CHF (congestive heart failure) ([place])   . Chronic atrial fibrillation ([place]) 06/2007   Tachybradycardia pacemaker  . Chronic kidney disease    10% function - ?R, other kidney is compensating    . CVA (cerebral vascular accident) [facility]) [phone]   denies residual on 04/23/2017  . Depression   . Diplopia 06/19/2008   Qualifier: Diagnosis of  By: [name] MD, [name]   . Dysrhythmia    ATRIAL FIBRILATION  . Edema of lower extremity   . Hyperlipidemia   . Hypertension   . Inferior myocardial infarction [facility])    acute inferior wall mi/other medical hx  . Myocardial infarction ([place]) [id number]  . Obesity   . OSA on CPAP    last test- 2010  . Pacemaker   . Pneumonia 2014   tx. ----  [facility]  . Pulmonary hypertension ([place])    moderate pulmonary hypertension by 10/2016 echo and 10/2013 cardiac cath  . Shortness of breath   . Skin cancer    "cut off right [place]; burned off LLE" (04/23/2017)  .  Sleep apnea   . Spondylolisthesis   . TIA (transient ischemic attack) 2008  . Unspecified hemorrhoids without mention of complication 01/19/9469   Colonoscopy--Dr. Carlean Purl         Past Surgical History:  Procedure Laterality Date  . ABDOMINAL HYSTERECTOMY    . APPENDECTOMY  1984  . ATRIAL FIBRILLATION ABLATION N/A 09/29/2016   Procedure: Atrial Fibrillation Ablation;  Surgeon: Will Meredith Leeds, MD;  Location: [place] CV LAB;  Service: Cardiovascular;  Laterality: N/A;  . ATRIAL FIBRILLATION ABLATION N/A 02/07/2018   Procedure: ATRIAL FIBRILLATION ABLATION;  Surgeon: Constance Haw, MD;  Location: [place] CV LAB;  Service:  Cardiovascular;  Laterality: N/A;  . ATRIAL FIBRILLATION ABLATION N/A 03/13/2019   Procedure: ATRIAL FIBRILLATION ABLATION;  Surgeon: Constance Haw, MD;  Location: [place] CV LAB;  Service: Cardiovascular;  Laterality: N/A;  . BACK SURGERY    . CARDIOVERSION N/A 09/12/2017   Procedure: CARDIOVERSION;  Surgeon: Jerline Pain, MD;  Location: [facility] ENDOSCOPY;  Service: Cardiovascular;  Laterality: N/A;  . CARDIOVERSION N/A 12/13/2017   Procedure: CARDIOVERSION;  Surgeon: Sanda Klein, MD;  Location: [place] ENDOSCOPY;  Service: Cardiovascular;  Laterality: N/A;  . CARDIOVERSION N/A 02/18/2018   Procedure: CARDIOVERSION;  Surgeon: Dorothy Spark, MD;  Location: [facility] ENDOSCOPY;  Service: Cardiovascular;  Laterality: N/A;  . CARDIOVERSION N/A 05/20/2018   Procedure: CARDIOVERSION;  Surgeon: [name], MD;  Location: [facility] ENDOSCOPY;  Service: Cardiovascular;  Laterality: N/A;  . CARDIOVERSION N/A 04/16/2019   Procedure: CARDIOVERSION;  Surgeon: Donato Heinz, MD;  Location: [place];  Service: Endoscopy;  Laterality: N/A;  . CARDIOVERSION N/A 01/22/2020   Procedure: CARDIOVERSION;  Surgeon: Geralynn Rile, MD;  Location: [place];  Service: Cardiovascular;  Laterality: N/A;  . CATARACT EXTRACTION W/ INTRAOCULAR LENS  IMPLANT, BILATERAL Bilateral 01/15/2017- 03/2017  . CHOLECYSTECTOMY    . CORONARY ANGIOPLASTY  X 2  . CORONARY ANGIOPLASTY WITH STENT PLACEMENT  1998; ~ 2007; ?date   "1 stent; replaced stent; not sure when I got the last stent" (04/23/2017)  . DOPPLER ECHOCARDIOGRAPHY  2009  . ELECTROPHYSIOLOGIC STUDY N/A 03/31/2016   Procedure: Cardioversion;  Surgeon: Evans Lance, MD;  Location: [place] CV LAB;  Service: Cardiovascular;  Laterality: N/A;  . ELECTROPHYSIOLOGIC STUDY N/A 08/04/2016   Procedure: Cardioversion;  Surgeon: Evans Lance, MD;  Location: [place] CV LAB;  Service: Cardiovascular;  Laterality: N/A;  . INSERT / REPLACE  / REMOVE PACEMAKER  06/2007  . IR RADIOLOGY PERIPHERAL GUIDED IV START  01/31/2018  . IR US GUIDE VASC ACCESS LEFT  01/31/2018  . JOINT REPLACEMENT    . LAPAROSCOPIC CHOLECYSTECTOMY  1994  . LEFT AND RIGHT HEART CATHETERIZATION WITH CORONARY ANGIOGRAM N/A 10/06/2013   Procedure: LEFT AND RIGHT HEART CATHETERIZATION WITH CORONARY ANGIOGRAM;  Surgeon: Troy Sine, MD;  Location: [facility] CATH LAB;  Service: Cardiovascular;  Laterality: N/A;  . LEFT OOPHORECTOMY Left ~ 1989  . POSTERIOR LUMBAR FUSION  2000s - 04/2015 X 3   L3-4; L4-5; L2-3; Dr Trenton Gammon  . RIGHT/LEFT HEART CATH AND CORONARY ANGIOGRAPHY N/A 05/09/2017   Procedure: RIGHT/LEFT HEART CATH AND CORONARY ANGIOGRAPHY;  Surgeon: Sherren Mocha, MD;  Location: [place] CV LAB;  Service: Cardiovascular;  Laterality: N/A;  . SKIN CANCER EXCISION Right    [place]  . TEE WITHOUT CARDIOVERSION N/A 09/29/2016   Procedure: TRANSESOPHAGEAL ECHOCARDIOGRAM (TEE);  Surgeon: Jerline Pain, MD;  Location: [place];  Service: Cardiovascular;  Laterality: N/A;  .  TOTAL ABDOMINAL HYSTERECTOMY  1984   "uterus & right ovary"  . TOTAL KNEE ARTHROPLASTY Left 04/23/2017  . TOTAL KNEE ARTHROPLASTY Left 04/23/2017   Procedure: TOTAL KNEE ARTHROPLASTY;  Surgeon: Frederik Pear, MD;  Location: [place];  Service: Orthopedics;  Laterality: Left;  . ULTRASOUND GUIDANCE FOR VASCULAR ACCESS  05/09/2017   Procedure: Ultrasound Guidance For Vascular Access;  Surgeon: Sherren Mocha, MD;  Location: [place] CV LAB;  Service: Cardiovascular;;          Current Outpatient Medications  Medication Sig Dispense Refill  . Ascorbic Acid (VITAMIN C) 1000 MG tablet Take 1,000 mg by mouth daily.     [name] atorvastatin (LIPITOR) 40 MG tablet TAKE 1 TABLET BY MOUTH EVERY DAY (Patient taking differently: Take 40 mg by mouth daily. ) 90 tablet 3  . Calcium Carbonate-Vitamin D (CALCIUM 600+D PO) Take 1 tablet by mouth daily.     . Cholecalciferol (VITAMIN D3) 125 MCG  (5000 UT) TABS Take 5,000 Units by mouth daily.     . Cyanocobalamin (B-12) 5000 MCG CAPS Take 5,000 mcg by mouth daily.    [name] diltiazem (CARDIZEM CD) 240 MG 24 hr capsule TAKE 1 CAPSULE BY MOUTH EVERY DAY **DOSE INCREASE** (Patient taking differently: Take 240 mg by mouth daily. ) 90 capsule 2  . DULoxetine (CYMBALTA) 60 MG capsule Take 1 capsule (60 mg total) by mouth daily. 90 capsule 0  . ELIQUIS 5 MG TABS tablet TAKE 1 TABLET BY MOUTH TWICE A DAY (Patient taking differently: Take 5 mg by mouth 2 (two) times daily. ) 180 tablet 1  . fluticasone (FLONASE) 50 MCG/ACT nasal spray Place 1 spray into both nostrils daily as needed for allergies.     [name] KLOR-CON M20 20 MEQ tablet TAKE 1 TABLET BY MOUTH TWICE A DAY 60 tablet 3  . loratadine (CLARITIN) 10 MG tablet TAKE 1 TABLET BY MOUTH EVERY DAY (Patient taking differently: Take 10 mg by mouth daily. ) 90 tablet 1  . Lysine 1000 MG TABS Take 1,000 mg by mouth daily.     . Multiple Vitamin (MULTIVITAMIN WITH MINERALS) TABS tablet Take 1 tablet by mouth daily. Centrum    . nitroGLYCERIN (NITROSTAT) 0.4 MG SL tablet PLACE 1 TABLET (0.4 MG TOTAL) UNDER THE TONGUE EVERY 5 (FIVE) MINUTES X 3 DOSES AS NEEDED FOR CHEST PAIN. 75 tablet 1  . nystatin-triamcinolone (MYCOLOG II) cream Apply 1 application topically 2 (two) times daily as needed. 30 g 1  . omeprazole (PRILOSEC) 20 MG capsule Take 20 mg by mouth daily before breakfast.     . Polyethyl Glycol-Propyl Glycol (SYSTANE) 0.4-0.3 % SOLN Place 1 drop into both eyes 2 (two) times daily.     [name] torsemide (DEMADEX) 20 MG tablet Take 2 tablets (40 mg total) by mouth 2 (two) times daily. 360 tablet 3  . valACYclovir (VALTREX) 1000 MG tablet Take 2 tablets (2,000 mg total) by mouth every 12 (twelve) hours as needed (fever blisters/cold sores.). 30 tablet 1  . vitamin E 1000 UNIT capsule Take 1,000 Units by mouth daily.      No current facility-administered medications for this encounter.          Allergies  Allergen Reactions  . Adhesive [Tape] Rash and Other (See Comments)    Allergic to defibrillation pads.    Social History        Socioeconomic History  . Marital status: Married    Spouse name: Not on file  . Number of children: 0  .  Years of education: HS  . Highest education level: Not on file  Occupational History  . Occupation: retired    Comment: previously worked [employer]  . Smoking status: Former Smoker    Packs/day: 1.00    Years: 5.00    Pack years: 5.00    Types: Cigarettes    Start date: 05/11/1978    Quit date: 07/03/1982    Years since quitting: 37.6  . Smokeless tobacco: Never Used  Vaping Use  . Vaping Use: Never used  Substance and Sexual Activity  . Alcohol use: Yes    Comment: 04/23/2017 "once q 6 months; glass of wine"  . Drug use: No  . Sexual activity: Not Currently  Other Topics Concern  . 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   Married    Originally from [place] of two, high school education   Former smoker Pension scheme manager dogs 7    Retired from [employer] 2    Social Determinants of Scientist, physiological Strain:   . Difficulty of Paying Living Expenses:   Food Insecurity:   . Worried About Charity fundraiser in the Last Year:   . Arboriculturist in the Last Year:   Transportation Needs: No Transportation Needs  . Lack of Transportation (Medical): No  . Lack of Transportation (Non-Medical): No  Physical Activity:   . Days of Exercise per Week:   . Minutes of Exercise per Session:   Stress:   . Feeling of Stress :   Social Connections:   . Frequency of Communication with Friends and Family:   . Frequency of Social Gatherings with Friends and Family:   . Attends Religious Services:   . Active Member of Clubs or Organizations:   . Attends Theatre manager Meetings:   [name] Marital Status:   Intimate Partner Violence:   . Fear of Current or Ex-Partner:   . Emotionally Abused:   [name] Physically Abused:   . Sexually Abused:          Family History  Problem Relation Age of Onset  . Suicidality Father        suicide death pt was 3 yrs, 68  . Arrhythmia Mother   . Hypertension Mother   . Diabetes Mother   . Dementia Mother   . Heart attack Brother   . Heart disease Paternal Aunt   . Prostate cancer Maternal Grandfather   . Diabetes Paternal Grandfather        fathers side of the family  . Colon cancer Maternal Aunt     ROS- All systems are reviewed and negative except as per the HPI above  Physical Exam:    Vitals:   02/16/20 1156  BP: (!) 154/76  Pulse: (!) 112  Weight: 123.1 kg  Height: 5\' 5"  (1.651 m)      Wt Readings from Last 3 Encounters:  02/16/20 123.1 kg  01/29/20 (!) 123.9 kg  01/28/20 (!) 123.9 kg    Labs: Recent Labs       Lab Results  Component Value Date   NA 138 02/16/2020   K 4.5 02/16/2020   CL 99 02/16/2020   CO2 28 02/16/2020   GLUCOSE 159 (H) 02/16/2020   BUN 23 02/16/2020   CREATININE 1.24 (H) 02/16/2020  CALCIUM 9.5 02/16/2020   PHOS 4.0 06/01/2018   MG 2.4 02/16/2020     Recent Labs       Lab Results  Component Value Date   INR 1.0 05/08/2017     Recent Labs       Lab Results  Component Value Date   CHOL 151 04/10/2019   HDL 50.80 04/10/2019   LDLCALC 68 04/10/2019   TRIG 160.0 (H) 04/10/2019      GEN- The patient is well appearing obese female, alert and oriented x 3 today.   HEENT-head normocephalic, atraumatic, sclera clear, conjunctiva pink, hearing intact, trachea midline. Lungs- Clear to ausculation bilaterally, normal work of breathing Heart- irregular rate and rhythm, no murmurs, rubs or gallops  GI- soft, NT, ND, + BS Extremities- no clubbing, cyanosis, or edema MS- no significant deformity or atrophy Skin-  no rash or lesion Psych- euthymic mood, full affect Neuro- strength and sensation are intact   EKG- atrial flutter with variable conduction HR 112, QRS 88, QTc 425   Assessment and Plan: 1. Persistent  atrial flutter  Patient wants to pursue dofetilide, aware of risk vrs benefit Aware of price of dofetilide. Patient will continue on anticoagulation, states no missed doses No benadryl use PharmD has screened drugs. Prozac changed to Cymbalta. QTc in SR 469 ms, OK per [place]. Labs today show creatinine at 1.24, K+ 4.5 and mag 2.4, CrCl calculated at 77 mL/min Continue  diltiazem 240 daily Continue Eliquis 5 mg BID. Patient denies any missed doses in the last 3 weeks. She was on dofetilide 250 mcg previously.   2. CHA2DS2VASc score of 3 Continue eliquis 5 mg bid  States no missed doses for at least 3 weeks   3. Diastolic HF No signs or symptoms of fluid overload today.   To be admitted later today once a bed becomes available.    [facility] [address] [place], [place] [address] [phone]  Patient seen and examined with Sande Rives PA-C.  Agree as above, with the following exceptions and changes as noted below. Patient admitted for Tikosyn loading. Gen: NAD, CV: iRRR, Lungs: clear, Abd: soft, Extrem: Warm, well perfused, no edema, Neuro/Psych: alert and oriented x 3, normal mood and affect. All available labs, radiology testing, previous records reviewed. Initiate Tikosyn loading as noted above. Discussed with patient. She is interested in [place] when ready for discharge and I showed her how to use this.  Elouise Munroe, MD 02/16/20 8:43 PM

## 2020-02-16 NOTE — Progress Notes (Signed)
Primary Care Physician: [name], MD Referring Physician:[name] is a 74 y.o. female with a h/o PPM, chronic diastolic HF,  paroxysmal  Afib/flutter  that is in the afib clinic for persistent  afib for the last 2 weeks. She  feels more shortness of breath and has noted increase in pedal edema. She has had 3 prior Ablations, most recent 03/14/2019.  She had had both covid shots and has not missed any anticoagulation with a CHA2DS2VASc of 3.   F/u in afib clinic, 7/30, she had a successful CV, 7/22. Unfortunately had ERAF. She has few choices going forward.. She had tikosyn in the past but it was stopped when she had an infection several years  ago, the pt thinks it was to treat pnumonia . She failed amio after [employer]. She had had 3 ablations and several cardioversion's. I discussed with Dr. Curt Bears. He is not in favor of repeat ablation, he suggested reloading of tikosyn. AV nodal ablation is a possibility as she already has a PPM, but he prefer this to be her last option. Her qt is at 471 ms today and she is on Prozac. She  will contact her PCP to see if she can be switched to Cymbalta, from Prozac,  preferred antidepressant with tikosyn.   On follow up today, patient presents for dofetilide admission. She has changed from Prozac to Cymbalta and has not had any doses of Tylenol PM in the last 3 days. She denies any missed doses of anticoagulation in the last 3 weeks.   Today, she denies symptoms of palpitations, chest pain, shortness of breath, orthopnea, PND, lower extremity edema, dizziness, presyncope, syncope, or neurologic sequela. The patient is tolerating medications without difficulties and is otherwise without complaint today.   Past Medical History:  Diagnosis Date  . Anticoagulant long-term use    pradaxa  . Anxiety   . Arthritis    "fingers, lower back" (04/23/2017   . CAD (coronary artery disease) 9024,0973   post PTCA with bare-metal stenting to mid RCA  in December 2004     . CHF (congestive heart failure) ([place])   . Chronic atrial fibrillation ([place]) 06/2007   Tachybradycardia pacemaker  . Chronic kidney disease    10% function - ?R, other kidney is compensating    . CVA (cerebral vascular accident) [facility]) [phone]   denies residual on 04/23/2017  . Depression   . Diplopia 06/19/2008   Qualifier: Diagnosis of  By: [name] MD, [name]   . Dysrhythmia    ATRIAL FIBRILATION  . Edema of lower extremity   . Hyperlipidemia   . Hypertension   . Inferior myocardial infarction [facility])    acute inferior wall mi/other medical hx  . Myocardial infarction ([place]) [id number]  . Obesity   . OSA on CPAP    last test- 2010  . Pacemaker   . Pneumonia 2014   tx. ----  [facility]  . Pulmonary hypertension ([place])    moderate pulmonary hypertension by 10/2016 echo and 10/2013 cardiac cath  . Shortness of breath   . Skin cancer    "cut off right S[place]ie; burned off LLE" (04/23/2017)  . Sleep apnea   . Spondylolisthesis   . TIA (transient ischemic attack) 2008  . Unspecified hemorrhoids without mention of complication 6/83/4196   Colonoscopy--Dr. Carlean Purl    Past Surgical History:  Procedure Laterality Date  . ABDOMINAL HYSTERECTOMY    . APPENDECTOMY  1984  . ATRIAL FIBRILLATION  ABLATION N/A 09/29/2016   Procedure: Atrial Fibrillation Ablation;  Surgeon: Will Meredith Leeds, MD;  Location: [place] CV LAB;  Service: Cardiovascular;  Laterality: N/A;  . ATRIAL FIBRILLATION ABLATION N/A 02/07/2018   Procedure: ATRIAL FIBRILLATION ABLATION;  Surgeon: Constance Haw, MD;  Location: [place] CV LAB;  Service: Cardiovascular;  Laterality: N/A;  . ATRIAL FIBRILLATION ABLATION N/A 03/13/2019   Procedure: ATRIAL FIBRILLATION ABLATION;  Surgeon: Constance Haw, MD;  Location: [place] CV LAB;  Service: Cardiovascular;  Laterality: N/A;  . BACK SURGERY    . CARDIOVERSION N/A 09/12/2017   Procedure: CARDIOVERSION;  Surgeon: Jerline Pain, MD;   Location: [facility] ENDOSCOPY;  Service: Cardiovascular;  Laterality: N/A;  . CARDIOVERSION N/A 12/13/2017   Procedure: CARDIOVERSION;  Surgeon: Sanda Klein, MD;  Location: [place] ENDOSCOPY;  Service: Cardiovascular;  Laterality: N/A;  . CARDIOVERSION N/A 02/18/2018   Procedure: CARDIOVERSION;  Surgeon: Dorothy Spark, MD;  Location: [facility] ENDOSCOPY;  Service: Cardiovascular;  Laterality: N/A;  . CARDIOVERSION N/A 05/20/2018   Procedure: CARDIOVERSION;  Surgeon: [name], MD;  Location: [facility] ENDOSCOPY;  Service: Cardiovascular;  Laterality: N/A;  . CARDIOVERSION N/A 04/16/2019   Procedure: CARDIOVERSION;  Surgeon: Donato Heinz, MD;  Location: [place];  Service: Endoscopy;  Laterality: N/A;  . CARDIOVERSION N/A 01/22/2020   Procedure: CARDIOVERSION;  Surgeon: Geralynn Rile, MD;  Location: [place];  Service: Cardiovascular;  Laterality: N/A;  . CATARACT EXTRACTION W/ INTRAOCULAR LENS  IMPLANT, BILATERAL Bilateral 01/15/2017- 03/2017  . CHOLECYSTECTOMY    . CORONARY ANGIOPLASTY  X 2  . CORONARY ANGIOPLASTY WITH STENT PLACEMENT  1998; ~ 2007; ?date   "1 stent; replaced stent; not sure when I got the last stent" (04/23/2017)  . DOPPLER ECHOCARDIOGRAPHY  2009  . ELECTROPHYSIOLOGIC STUDY N/A 03/31/2016   Procedure: Cardioversion;  Surgeon: Evans Lance, MD;  Location: [place] CV LAB;  Service: Cardiovascular;  Laterality: N/A;  . ELECTROPHYSIOLOGIC STUDY N/A 08/04/2016   Procedure: Cardioversion;  Surgeon: Evans Lance, MD;  Location: [place] CV LAB;  Service: Cardiovascular;  Laterality: N/A;  . INSERT / REPLACE / REMOVE PACEMAKER  06/2007  . IR RADIOLOGY PERIPHERAL GUIDED IV START  01/31/2018  . IR US GUIDE VASC ACCESS LEFT  01/31/2018  . JOINT REPLACEMENT    . LAPAROSCOPIC CHOLECYSTECTOMY  1994  . LEFT AND RIGHT HEART CATHETERIZATION WITH CORONARY ANGIOGRAM N/A 10/06/2013   Procedure: LEFT AND RIGHT HEART CATHETERIZATION WITH CORONARY ANGIOGRAM;  Surgeon: Troy Sine, MD;  Location: [facility] CATH LAB;  Service: Cardiovascular;  Laterality: N/A;  . LEFT OOPHORECTOMY Left ~ 1989  . POSTERIOR LUMBAR FUSION  2000s - 04/2015 X 3   L3-4; L4-5; L2-3; Dr Trenton Gammon  . RIGHT/LEFT HEART CATH AND CORONARY ANGIOGRAPHY N/A 05/09/2017   Procedure: RIGHT/LEFT HEART CATH AND CORONARY ANGIOGRAPHY;  Surgeon: Sherren Mocha, MD;  Location: [place] CV LAB;  Service: Cardiovascular;  Laterality: N/A;  . SKIN CANCER EXCISION Right    [place]  . TEE WITHOUT CARDIOVERSION N/A 09/29/2016   Procedure: TRANSESOPHAGEAL ECHOCARDIOGRAM (TEE);  Surgeon: Jerline Pain, MD;  Location: [place];  Service: Cardiovascular;  Laterality: N/A;  . TOTAL ABDOMINAL HYSTERECTOMY  1984   "uterus & right ovary"  . TOTAL KNEE ARTHROPLASTY Left 04/23/2017  . TOTAL KNEE ARTHROPLASTY Left 04/23/2017   Procedure: TOTAL KNEE ARTHROPLASTY;  Surgeon: Frederik Pear, MD;  Location: [place];  Service: Orthopedics;  Laterality: Left;  . ULTRASOUND GUIDANCE FOR VASCULAR ACCESS  05/09/2017  Procedure: Ultrasound Guidance For Vascular Access;  Surgeon: Sherren Mocha, MD;  Location: [place] CV LAB;  Service: Cardiovascular;;    Current Outpatient Medications  Medication Sig Dispense Refill  . Ascorbic Acid (VITAMIN C) 1000 MG tablet Take 1,000 mg by mouth daily.     [name] atorvastatin (LIPITOR) 40 MG tablet TAKE 1 TABLET BY MOUTH EVERY DAY (Patient taking differently: Take 40 mg by mouth daily. ) 90 tablet 3  . Calcium Carbonate-Vitamin D (CALCIUM 600+D PO) Take 1 tablet by mouth daily.     . Cholecalciferol (VITAMIN D3) 125 MCG (5000 UT) TABS Take 5,000 Units by mouth daily.     . Cyanocobalamin (B-12) 5000 MCG CAPS Take 5,000 mcg by mouth daily.    [name] diltiazem (CARDIZEM CD) 240 MG 24 hr capsule TAKE 1 CAPSULE BY MOUTH EVERY DAY **DOSE INCREASE** (Patient taking differently: Take 240 mg by mouth daily. ) 90 capsule 2  . DULoxetine (CYMBALTA) 60 MG capsule Take 1 capsule (60 mg total) by mouth daily. 90 capsule 0   . ELIQUIS 5 MG TABS tablet TAKE 1 TABLET BY MOUTH TWICE A DAY (Patient taking differently: Take 5 mg by mouth 2 (two) times daily. ) 180 tablet 1  . fluticasone (FLONASE) 50 MCG/ACT nasal spray Place 1 spray into both nostrils daily as needed for allergies.     [name] KLOR-CON M20 20 MEQ tablet TAKE 1 TABLET BY MOUTH TWICE A DAY 60 tablet 3  . loratadine (CLARITIN) 10 MG tablet TAKE 1 TABLET BY MOUTH EVERY DAY (Patient taking differently: Take 10 mg by mouth daily. ) 90 tablet 1  . Lysine 1000 MG TABS Take 1,000 mg by mouth daily.     . Multiple Vitamin (MULTIVITAMIN WITH MINERALS) TABS tablet Take 1 tablet by mouth daily. Centrum    . nitroGLYCERIN (NITROSTAT) 0.4 MG SL tablet PLACE 1 TABLET (0.4 MG TOTAL) UNDER THE TONGUE EVERY 5 (FIVE) MINUTES X 3 DOSES AS NEEDED FOR CHEST PAIN. 75 tablet 1  . nystatin-triamcinolone (MYCOLOG II) cream Apply 1 application topically 2 (two) times daily as needed. 30 g 1  . omeprazole (PRILOSEC) 20 MG capsule Take 20 mg by mouth daily before breakfast.     . Polyethyl Glycol-Propyl Glycol (SYSTANE) 0.4-0.3 % SOLN Place 1 drop into both eyes 2 (two) times daily.     [name] torsemide (DEMADEX) 20 MG tablet Take 2 tablets (40 mg total) by mouth 2 (two) times daily. 360 tablet 3  . valACYclovir (VALTREX) 1000 MG tablet Take 2 tablets (2,000 mg total) by mouth every 12 (twelve) hours as needed (fever blisters/cold sores.). 30 tablet 1  . vitamin E 1000 UNIT capsule Take 1,000 Units by mouth daily.      No current facility-administered medications for this encounter.    Allergies  Allergen Reactions  . Adhesive [Tape] Rash and Other (See Comments)    Allergic to defibrillation pads.    Social History   Socioeconomic History  . Marital status: Married    Spouse name: Not on file  . Number of children: 0  . Years of education: HS  . Highest education level: Not on file  Occupational History  . Occupation: retired    Comment: previously worked [employer]  . Smoking status: Former Smoker    Packs/day: 1.00    Years: 5.00    Pack years: 5.00    Types: Cigarettes    Start date: 05/11/1978    Quit date: 07/03/1982  Years since quitting: 37.6  . Smokeless tobacco: Never Used  Vaping Use  . Vaping Use: Never used  Substance and Sexual Activity  . Alcohol use: Yes    Comment: 04/23/2017 "once q 6 months; glass of wine"  . Drug use: No  . Sexual activity: Not Currently  Other Topics Concern  . 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   Married    Originally from [place] of two, high school education   Former smoker Pension scheme manager dogs 7    Retired from [place] 2    Social Determinants of Radio broadcast assistant Strain:   . Difficulty of Paying Living Expenses:   Food Insecurity:   . Worried About Charity fundraiser in the Last Year:   . Arboriculturist in the Last Year:   Transportation Needs: No Transportation Needs  . Lack of Transportation (Medical): No  . Lack of Transportation (Non-Medical): No  Physical Activity:   . Days of Exercise per Week:   . Minutes of Exercise per Session:   Stress:   . Feeling of Stress :   Social Connections:   . Frequency of Communication with Friends and Family:   . Frequency of Social Gatherings with Friends and Family:   . Attends Religious Services:   . Active Member of Clubs or Organizations:   . Attends Archivist Meetings:   [name] Marital Status:   Intimate Partner Violence:   . Fear of Current or Ex-Partner:   . Emotionally Abused:   [name] Physically Abused:   . Sexually Abused:     Family History  Problem Relation Age of Onset  . Suicidality Father        suicide death pt was 3 yrs, 73  . Arrhythmia Mother   . Hypertension Mother   . Diabetes Mother   . Dementia Mother   . Heart attack Brother   . Heart disease Paternal Aunt   . Prostate cancer  Maternal Grandfather   . Diabetes Paternal Grandfather        fathers side of the family  . Colon cancer Maternal Aunt     ROS- All systems are reviewed and negative except as per the HPI above  Physical Exam: Vitals:   02/16/20 1156  BP: (!) 154/76  Pulse: (!) 112  Weight: 123.1 kg  Height: 5\' 5"  (1.651 m)   Wt Readings from Last 3 Encounters:  02/16/20 123.1 kg  01/29/20 (!) 123.9 kg  01/28/20 (!) 123.9 kg    Labs: Lab Results  Component Value Date   NA 138 02/16/2020   K 4.5 02/16/2020   CL 99 02/16/2020   CO2 28 02/16/2020   GLUCOSE 159 (H) 02/16/2020   BUN 23 02/16/2020   CREATININE 1.24 (H) 02/16/2020   CALCIUM 9.5 02/16/2020   PHOS 4.0 06/01/2018   MG 2.4 02/16/2020   Lab Results  Component Value Date   INR 1.0 05/08/2017   Lab Results  Component Value Date   CHOL 151 04/10/2019   HDL 50.80 04/10/2019   LDLCALC 68 04/10/2019   TRIG 160.0 (H) 04/10/2019    GEN- The patient is well appearing obese female, alert and oriented x 3 today.   HEENT-head normocephalic, atraumatic, sclera clear, conjunctiva pink, hearing intact, trachea midline. Lungs- Clear to ausculation bilaterally, normal work of breathing  Heart- irregular rate and rhythm, no murmurs, rubs or gallops  GI- soft, NT, ND, + BS Extremities- no clubbing, cyanosis, or edema MS- no significant deformity or atrophy Skin- no rash or lesion Psych- euthymic mood, full affect Neuro- strength and sensation are intact   EKG- atrial flutter with variable conduction HR 112, QRS 88, QTc 425   Assessment and Plan: 1. Persistent  atrial flutter  Patient wants to pursue dofetilide, aware of risk vrs benefit Aware of price of dofetilide. Patient will continue on anticoagulation, states no missed doses No benadryl use PharmD has screened drugs. Prozac changed to Cymbalta. QTc in SR 469 ms, OK per [place]. Labs today show creatinine at 1.24, K+ 4.5 and mag 2.4, CrCl calculated at 77 mL/min Continue   diltiazem 240 daily Continue Eliquis 5 mg BID. Patient denies any missed doses in the last 3 weeks. She was on dofetilide 250 mcg previously.   2. CHA2DS2VASc score of 3 Continue eliquis 5 mg bid  States no missed doses for at least 3 weeks   3. Diastolic HF No signs or symptoms of fluid overload today.   To be admitted later today once a bed becomes available.    [facility] [address] [place], [place] [address] [phone]

## 2020-02-16 NOTE — Progress Notes (Signed)
Pharmacy: Dofetilide (Tikosyn) - Initial Consult Assessment and Electrolyte Replacement  Pharmacy consulted to assist in monitoring and replacing electrolytes in this 74 y.o. female admitted on 02/16/2020 undergoing dofetilide initiation. First dofetilide dose: 02/16/20 pm.   Assessment:  Patient Exclusion Criteria: If any screening criteria checked as "Yes", then  patient  should NOT receive dofetilide until criteria item is corrected.  If "Yes" please indicate correction plan.  YES  NO Patient  Exclusion Criteria Correction Plan    [x]     []   Baseline QTc interval is greater than or equal to 440 msec. IF above YES box checked dofetilide contraindicated unless patient has ICD; then may proceed if QTc 500-550 msec or with known ventricular conduction abnormalities may proceed with QTc 550-600 msec. QTc = 469 EP aware, ok to proceed   []   [x]   Patient is known or suspected to have a digoxin level greater than 2 ng/ml: No results found for: DIGOXIN     []   [x]   Creatinine clearance less than 20 ml/min (calculated using Cockcroft-Gault, actual body weight and serum creatinine): Estimated Creatinine Clearance: 52.2 mL/min (A) (by C-G formula based on SCr of 1.24 mg/dL (H)).     []   [x]  Patient has received drugs known to prolong the QT intervals within the last 48 hours (phenothiazines, tricyclics or tetracyclic antidepressants, erythromycin, H-1 antihistamines, cisapride, fluoroquinolones, azithromycin). Updated information on QT prolonging agents is available to be searched on the following database:QT prolonging agents  No recent tylenol PM use (benadryl)   []   [x]   Patient received a dose of hydrochlorothiazide (Oretic) alone or in any combination including triamterene (Dyazide, Maxzide) in the last 48 hours.    []   [x]  Patient received a medication known to increase dofetilide plasma concentrations prior to initial dofetilide dose:  . Trimethoprim (Primsol, Proloprim) in the  last 36 hours . Verapamil (Calan, Verelan) in the last 36 hours or a sustained release dose in the last 72 hours . Megestrol (Megace) in the last 5 days  . Cimetidine (Tagamet) in the last 6 hours . Ketoconazole (Nizoral) in the last 24 hours . Itraconazole (Sporanox) in the last 48 hours  . Prochlorperazine (Compazine) in the last 36 hours     []   [x]   Patient is known to have a history of torsades de pointes; congenital or acquired long QT syndromes.    []   [x]   Patient has received a Class 1 antiarrhythmic with less than 2 half-lives since last dose. (Disopyramide, Quinidine, Procainamide, Lidocaine, Mexiletine, Flecainide, Propafenone)    []   [x]   Patient has received amiodarone therapy in the past 3 months or amiodarone level is greater than 0.3 ng/ml.    Patient has been appropriately anticoagulated with apixaban 5mg  BID..  Labs:    Component Value Date/Time   K 4.5 02/16/2020 1250   MG 2.4 02/16/2020 1250     Plan: Potassium: K >/= 4: Appropriate to initiate Tikosyn, no replacement needed    Magnesium: Mg >2: Appropriate to initiate Tikosyn, no replacement needed     Thank you for allowing pharmacy to participate in this patient's care   Arrie Senate, PharmD, BCPS Clinical Pharmacist [phone] Please check AMION for all [place] numbers 02/16/2020

## 2020-02-17 ENCOUNTER — Encounter (HOSPITAL_COMMUNITY): Payer: Self-pay | Admitting: Internal Medicine

## 2020-02-17 LAB — PROTIME-INR
INR: 1.3 — ABNORMAL HIGH (ref 0.8–1.2)
Prothrombin Time: 15.9 seconds — ABNORMAL HIGH (ref 11.4–15.2)

## 2020-02-17 LAB — BASIC METABOLIC PANEL
Anion gap: 14 (ref 5–15)
BUN: 21 mg/dL (ref 8–23)
CO2: 25 mmol/L (ref 22–32)
Calcium: 8.9 mg/dL (ref 8.9–10.3)
Chloride: 98 mmol/L (ref 98–111)
Creatinine, Ser: 1.04 mg/dL — ABNORMAL HIGH (ref 0.44–1.00)
GFR calc Af Amer: >60 mL/min (ref >60)
GFR calc non Af Amer: 53 mL/min — ABNORMAL LOW (ref >60)
Glucose, Bld: 147 mg/dL — ABNORMAL HIGH (ref 70–99)
Potassium: 3.7 mmol/L (ref 3.5–5.1)
Sodium: 137 mmol/L (ref 135–145)

## 2020-02-17 LAB — MAGNESIUM: Magnesium: 2 mg/dL (ref 1.7–2.4)

## 2020-02-17 MED ORDER — DOFETILIDE 250 MCG PO CAPS
250.0000 ug | ORAL_CAPSULE | Freq: Two times a day (BID) | ORAL | Status: DC
  Administered 2020-02-17 – 2020-02-19 (×5): 250 ug via ORAL
  Filled 2020-02-17 (×5): qty 1

## 2020-02-17 MED ORDER — MAGNESIUM SULFATE 2 GM/50ML IV SOLN
2.0000 g | Freq: Once | INTRAVENOUS | Status: AC
  Administered 2020-02-17: 2 g via INTRAVENOUS
  Filled 2020-02-17: qty 50

## 2020-02-17 MED ORDER — ATORVASTATIN CALCIUM 40 MG PO TABS
40.0000 mg | ORAL_TABLET | Freq: Every day | ORAL | Status: DC
  Administered 2020-02-17 – 2020-02-18 (×2): 40 mg via ORAL
  Filled 2020-02-17 (×3): qty 1

## 2020-02-17 MED ORDER — PANTOPRAZOLE SODIUM 40 MG PO TBEC
40.0000 mg | DELAYED_RELEASE_TABLET | Freq: Every day | ORAL | Status: DC
  Administered 2020-02-17 – 2020-02-19 (×2): 40 mg via ORAL
  Filled 2020-02-17 (×3): qty 1

## 2020-02-17 MED ORDER — TORSEMIDE 20 MG PO TABS
40.0000 mg | ORAL_TABLET | Freq: Two times a day (BID) | ORAL | Status: DC
  Administered 2020-02-17 – 2020-02-19 (×5): 40 mg via ORAL
  Filled 2020-02-17 (×4): qty 2

## 2020-02-17 MED ORDER — LORATADINE 10 MG PO TABS
10.0000 mg | ORAL_TABLET | Freq: Every day | ORAL | Status: DC
  Administered 2020-02-17 – 2020-02-19 (×3): 10 mg via ORAL
  Filled 2020-02-17 (×3): qty 1

## 2020-02-17 MED ORDER — POTASSIUM CHLORIDE CRYS ER 20 MEQ PO TBCR
40.0000 meq | EXTENDED_RELEASE_TABLET | Freq: Once | ORAL | Status: AC
  Administered 2020-02-17: 40 meq via ORAL
  Filled 2020-02-17: qty 2

## 2020-02-17 MED ORDER — DILTIAZEM HCL ER COATED BEADS 240 MG PO CP24
240.0000 mg | ORAL_CAPSULE | Freq: Every day | ORAL | Status: DC
  Administered 2020-02-17 – 2020-02-19 (×3): 240 mg via ORAL
  Filled 2020-02-17 (×3): qty 1

## 2020-02-17 MED ORDER — DULOXETINE HCL 60 MG PO CPEP
60.0000 mg | ORAL_CAPSULE | Freq: Every day | ORAL | Status: DC
  Administered 2020-02-17 – 2020-02-19 (×3): 60 mg via ORAL
  Filled 2020-02-17 (×3): qty 1

## 2020-02-17 MED ORDER — POTASSIUM CHLORIDE CRYS ER 20 MEQ PO TBCR
20.0000 meq | EXTENDED_RELEASE_TABLET | Freq: Two times a day (BID) | ORAL | Status: DC
  Administered 2020-02-17 – 2020-02-19 (×5): 20 meq via ORAL
  Filled 2020-02-17 (×5): qty 1

## 2020-02-17 NOTE — Progress Notes (Addendum)
Electrophysiology Rounding Note  Patient Name: [name] Date of Encounter: 02/17/2020  Primary Cardiologist: Mertie Moores, MD  Electrophysiologist: Constance Haw, MD    Subjective   Pt remains in afib on Tikosyn 250 mcg BID   QTc from EKG last pm shows stable QTc at ~470 ms  The patient is doing well today.  At this time, the patient denies chest pain, shortness of breath, or any new concerns.  Inpatient Medications    Scheduled Meds:  apixaban  5 mg Oral BID   atorvastatin  40 mg Oral Daily   diltiazem  240 mg Oral Daily   dofetilide  250 mcg Oral BID   DULoxetine  60 mg Oral Daily   loratadine  10 mg Oral Daily   pantoprazole  40 mg Oral Daily   potassium chloride SA  20 mEq Oral BID   potassium chloride  40 mEq Oral Once   sodium chloride flush  3 mL Intravenous Q12H   torsemide  40 mg Oral BID   Continuous Infusions:  sodium chloride     PRN Meds: sodium chloride, fluticasone, nitroGLYCERIN, sodium chloride flush, valACYclovir   Vital Signs    Vitals:   02/16/20 1808 02/16/20 1853 02/17/20 0023 02/17/20 0358  BP: (!) 164/113  (!) 160/110   Pulse: (!) 113  98 (!) 102  Resp: 20  20 18   Temp: 98.3 F (36.8 C)  98.3 F (36.8 C) 98 F (36.7 C)  TempSrc: Oral  Oral Oral  SpO2: 91%  98% 98%  Weight:  118.9 kg  117.8 kg  Height:  5\' 6"  (1.676 m)      Intake/Output Summary (Last 24 hours) at 02/17/2020 0730 Last data filed at 02/17/2020 0300 Gross per 24 hour  Intake 360 ml  Output 900 ml  Net -540 ml   Filed Weights   02/16/20 1853 02/17/20 0358  Weight: 118.9 kg 117.8 kg    Physical Exam    GEN- The patient is well appearing, alert and oriented x 3 today.   Head- normocephalic, atraumatic Eyes-  Sclera clear, conjunctiva pink Ears- hearing intact Oropharynx- clear Neck- supple Lungs- Clear to ausculation bilaterally, normal work of breathing Heart- Regular rate and rhythm, no murmurs, rubs or gallops GI- soft, NT, ND, +  BS Extremities- no clubbing, cyanosis, or edema Skin- no rash or lesion Psych- euthymic mood, full affect Neuro- strength and sensation are intact  Labs    CBC No results for input(s): WBC, NEUTROABS, HGB, HCT, MCV, PLT in the last 72 hours. Basic Metabolic Panel Recent Labs    02/16/20 1250 02/17/20 0333  NA 138 137  K 4.5 3.7  CL 99 98  CO2 28 25  GLUCOSE 159* 147*  BUN 23 21  CREATININE 1.24* 1.04*  CALCIUM 9.5 8.9  MG 2.4 2.0    Potassium  Date/Time Value Ref Range Status  02/17/2020 03:33 AM 3.7 3.5 - 5.1 mmol/L Final   Magnesium  Date/Time Value Ref Range Status  02/17/2020 03:33 AM 2.0 1.7 - 2.4 mg/dL Final    Comment:    Performed at [facility], [place] [address], [place], [place] [address]    Telemetry    AF with mild RVR ~ 90-110s (personally reviewed)  Radiology    No results found.   Patient Profile     [name] is a 74 y.o. female with a past medical history significant for persistent atrial fibrillation.  They were admitted for tikosyn  load.   Assessment & Plan    Persistent atrial fibrillation Pt remains in afib on Tikosyn 250 mcg BID  Continue Eliquis K 3.7 Supp. Mg stable.  CHA2DS2VASC is at least 6  2. Diastolic CHF Volume status stable.   If pt does not convert chemically, plan on DCCV tomorrow    For questions or updates, please contact [name] Please consult [email or web address] for contact info under Cardiology/STEMI.  Signed, Shirley Friar, PA-C  02/17/2020, 7:30 AM   I have seen and examined this patient with [name].  Agree with above, note added to reflect my findings.  On exam, tachycardic, no murmurs, lungs clear.  Patient received first dose of dofetilide yesterday.  QTC has remained stable.  We Keshav Winegar continue with dofetilide and likely cardioversion tomorrow.  Kaliel Bolds M. Alphia Behanna MD 02/17/2020 7:47 AM

## 2020-02-17 NOTE — Progress Notes (Addendum)
Pharmacy: Dofetilide (Tikosyn) - Follow Up Assessment and Electrolyte Replacement  Pharmacy consulted to assist in monitoring and replacing electrolytes in this 74 y.o. female admitted on 02/16/2020 undergoing dofetilide initiation.  Labs:    Component Value Date/Time   K 3.7 02/17/2020 0333   MG 2.0 02/17/2020 0333     Plan: Potassium: K= 3.7: MD replacing  Magnesium: Mg 1.8-2: Give Mg 2 gm IV x1     Thank you for allowing pharmacy to participate in this patient's care   Hildred Laser, PharmD Clinical Pharmacist **Pharmacist phone directory can now be found on [email or web address] ([email or web address]).  Listed under [place].

## 2020-02-17 NOTE — Progress Notes (Signed)
Morning EKG reviewed  Shows pt remains in afib (flutter) at 100 bpm with stable QTc at ~450 ms.  Continue Tikosyn 250 mcg BID.   Pt will be NPO after midnight for DCCV if remains in [place], [place]  Pager: [phone]  02/17/2020 12:06 PM

## 2020-02-17 NOTE — TOC Benefit Eligibility Note (Signed)
Transition of Care [facility]) Benefit Eligibility Note    Patient Details  Name: [name] MRN: [id number] Date of Birth: 02-May-1946   Medication/Dose: DOFETILIDE   250 MCG BID  CAPSULE  Covered?: Yes  Tier:  (TIER- 4 DRUG)  Prescription Coverage [facility]erred [facility]  Spoke with Person/Company/Phone Number:: JULIAN @ [employer] # [phone] OPT- 2  Co-Pay: $90.00  Prior Approval: No  Deductible: Met  Additional Notes: TIKOSYN : Crecencio Mc Phone Number: 02/17/2020, 1:11 PM

## 2020-02-18 ENCOUNTER — Encounter (HOSPITAL_COMMUNITY)
Admission: AD | Disposition: A | Discharge status: Home / Self Care | Payer: Self-pay | Source: Ambulatory Visit | Attending: Cardiology

## 2020-02-18 ENCOUNTER — Encounter (HOSPITAL_COMMUNITY): Payer: Self-pay | Admitting: Internal Medicine

## 2020-02-18 ENCOUNTER — Inpatient Hospital Stay (HOSPITAL_COMMUNITY): Payer: PPO | Admitting: Anesthesiology

## 2020-02-18 HISTORY — PX: CARDIOVERSION: SHX1299

## 2020-02-18 LAB — BASIC METABOLIC PANEL
Anion gap: 12 (ref 5–15)
BUN: 16 mg/dL (ref 8–23)
CO2: 28 mmol/L (ref 22–32)
Calcium: 9 mg/dL (ref 8.9–10.3)
Chloride: 99 mmol/L (ref 98–111)
Creatinine, Ser: 1.1 mg/dL — ABNORMAL HIGH (ref 0.44–1.00)
GFR calc Af Amer: 57 mL/min — ABNORMAL LOW (ref >60)
GFR calc non Af Amer: 49 mL/min — ABNORMAL LOW (ref >60)
Glucose, Bld: 167 mg/dL — ABNORMAL HIGH (ref 70–99)
Potassium: 4.4 mmol/L (ref 3.5–5.1)
Sodium: 139 mmol/L (ref 135–145)

## 2020-02-18 LAB — MAGNESIUM: Magnesium: 2.6 mg/dL — ABNORMAL HIGH (ref 1.7–2.4)

## 2020-02-18 SURGERY — CARDIOVERSION
Anesthesia: General

## 2020-02-18 MED ORDER — LIDOCAINE 2% (20 MG/ML) 5 ML SYRINGE
INTRAMUSCULAR | Status: DC | PRN
  Administered 2020-02-18: 60 mg via INTRAVENOUS

## 2020-02-18 MED ORDER — PROPOFOL 10 MG/ML IV BOLUS
INTRAVENOUS | Status: DC | PRN
  Administered 2020-02-18: 50 mg via INTRAVENOUS

## 2020-02-18 MED ORDER — SODIUM CHLORIDE 0.9 % IV SOLN: INTRAVENOUS | Status: DC | PRN

## 2020-02-18 NOTE — Transfer of Care (Signed)
Immediate Anesthesia Transfer of Care Note  Patient: [name]  Procedure(s) Performed: CARDIOVERSION (N/A )  Patient Location: Endoscopy Unit  Anesthesia Type:General  Level of Consciousness: awake, alert  and oriented  Airway & Oxygen Therapy: Patient Spontanous Breathing  Post-op Assessment: Report given to RN, Post -op Vital signs reviewed and stable and Patient moving all extremities  Post vital signs: Reviewed and stable  Last Vitals:  Vitals Value Taken Time  BP 122/49 02/18/20 0958  Temp    Pulse 75 02/18/20 0958  Resp 18 02/18/20 0958  SpO2 91 % 02/18/20 0958    Last Pain:  Vitals:   02/18/20 0840  TempSrc: Oral  PainSc: 0-No pain      Patients Stated Pain Goal: 0 (35/67/01 4103)  Complications: No complications documented.

## 2020-02-18 NOTE — H&P (View-Only) (Signed)
Electrophysiology Rounding Note  Patient Name: [name] Date of Encounter: 02/18/2020  Primary Cardiologist: Samantha Moores, MD  Electrophysiologist: Samantha Haw, MD    Subjective   Pt  now in atrial flutter  on Tikosyn 250 mcg BID   QTc from EKG last pm shows stable QTc at ~490  The patient is doing well today.  At this time, the patient denies chest pain, shortness of breath, or any new concerns.  Inpatient Medications    Scheduled Meds:  apixaban  5 mg Oral BID   atorvastatin  40 mg Oral Daily   diltiazem  240 mg Oral Daily   dofetilide  250 mcg Oral BID   DULoxetine  60 mg Oral Daily   loratadine  10 mg Oral Daily   pantoprazole  40 mg Oral Daily   potassium chloride  20 mEq Oral BID   sodium chloride flush  3 mL Intravenous Q12H   torsemide  40 mg Oral BID   Continuous Infusions:  sodium chloride     PRN Meds: sodium chloride, fluticasone, nitroGLYCERIN, sodium chloride flush, valACYclovir   Vital Signs    Vitals:   02/17/20 0358 02/17/20 1505 02/17/20 2009 02/18/20 0644  BP:  138/88 (!) 134/91 134/82  Pulse: (!) 102 (!) 101 79 (!) 103  Resp: 18 16 18 17   Temp: 98 F (36.7 C) (!) 97.4 F (36.3 C) 98.2 F (36.8 C) 98.3 F (36.8 C)  TempSrc: Oral Oral Oral Oral  SpO2: 98% 93% 94% 91%  Weight: 117.8 kg   121.3 kg  Height:        Intake/Output Summary (Last 24 hours) at 02/18/2020 0820 Last data filed at 02/18/2020 8338 Gross per 24 hour  Intake 1112 ml  Output 3100 ml  Net -1988 ml   Filed Weights   02/16/20 1853 02/17/20 0358 02/18/20 0644  Weight: 118.9 kg 117.8 kg 121.3 kg    Physical Exam    GEN- The patient is well appearing, alert and oriented x 3 today.   Head- normocephalic, atraumatic Eyes-  Sclera clear, conjunctiva pink Ears- hearing intact Oropharynx- clear Neck- supple Lungs- Clear to ausculation bilaterally, normal work of breathing Heart- Regular rate and rhythm, no murmurs, rubs or gallops GI- soft, NT, ND, +  BS Extremities- no clubbing, cyanosis, or edema Skin- no rash or lesion Psych- euthymic mood, full affect Neuro- strength and sensation are intact  Labs    CBC No results for input(s): WBC, NEUTROABS, HGB, HCT, MCV, PLT in the last 72 hours. Basic Metabolic Panel Recent Labs    02/17/20 0333 02/18/20 0633  NA 137 139  K 3.7 4.4  CL 98 99  CO2 25 28  GLUCOSE 147* 167*  BUN 21 16  CREATININE 1.04* 1.10*  CALCIUM 8.9 9.0  MG 2.0 2.6*    Potassium  Date/Time Value Ref Range Status  02/18/2020 06:33 AM 4.4 3.5 - 5.1 mmol/L Final   Magnesium  Date/Time Value Ref Range Status  02/18/2020 06:33 AM 2.6 (H) 1.7 - 2.4 mg/dL Final    Comment:    Performed at [facility], [place] [address] [place], [place] [address]    Telemetry    Atrial flutter 90-100s (personally reviewed)  Radiology    No results found.   Patient Profile     [name] is a 74 y.o. female with a past medical history significant for persistent atrial fibrillation.  They were admitted for tikosyn load.   Assessment &  Plan    Persistent atrial fibrillation Pt  is in atrial flutter  on Tikosyn 250 mcg BID  Continue Eliquis Electrolytes stable.  CHA2DS2VASC is at least 6.    Plan on DCCV this morning.     For questions or updates, please contact [place] Please consult [email or web address] for contact info under Cardiology/STEMI.  Signed, Samantha Friar, PA-C  02/18/2020, 8:20 AM   I have seen and examined this patient with [name].  Agree with above, note added to reflect my findings.  On exam, RRR, no murmurs, lungs clear. Converted to atrial flutter. [name] plan for cardioversion today.    Samantha Syme M. Pairlee Sawtell MD 02/18/2020 8:39 AM

## 2020-02-18 NOTE — Anesthesia Postprocedure Evaluation (Signed)
Anesthesia Post Note  Patient: [name]  Procedure(s) Performed: CARDIOVERSION (N/A )     Patient location during evaluation: Endoscopy Anesthesia Type: General Level of consciousness: awake and alert Pain management: pain level controlled Vital Signs Assessment: post-procedure vital signs reviewed and stable Respiratory status: spontaneous breathing, nonlabored ventilation, respiratory function stable and patient connected to nasal cannula oxygen Cardiovascular status: blood pressure returned to baseline and stable Postop Assessment: no apparent nausea or vomiting Anesthetic complications: no   No complications documented.  Last Vitals:  Vitals:   02/18/20 1038 02/18/20 1232  BP: 125/79 100/62  Pulse:  78  Resp:  20  Temp:  36.8 C  SpO2:  94%    Last Pain:  Vitals:   02/18/20 1232  TempSrc: Oral  PainSc:                  [name]

## 2020-02-18 NOTE — Progress Notes (Signed)
Morning EKG reviewed  Shows has converted to NSR at 73 bpm with stable QTc at ~480 ms.  Continue Tikosyn 250 mcg BID.   Plan for discharge tomorrow if QTc remains stable.    Shirley Friar, PA-C  Pager: [phone]  02/18/2020 12:10 PM

## 2020-02-18 NOTE — Progress Notes (Addendum)
Electrophysiology Rounding Note  Patient Name: [name] Date of Encounter: 02/18/2020  Primary Cardiologist: Mertie Moores, MD  Electrophysiologist: Constance Haw, MD    Subjective   Pt  now in atrial flutter  on Tikosyn 250 mcg BID   QTc from EKG last pm shows stable QTc at ~490  The patient is doing well today.  At this time, the patient denies chest pain, shortness of breath, or any new concerns.  Inpatient Medications    Scheduled Meds:  apixaban  5 mg Oral BID   atorvastatin  40 mg Oral Daily   diltiazem  240 mg Oral Daily   dofetilide  250 mcg Oral BID   DULoxetine  60 mg Oral Daily   loratadine  10 mg Oral Daily   pantoprazole  40 mg Oral Daily   potassium chloride  20 mEq Oral BID   sodium chloride flush  3 mL Intravenous Q12H   torsemide  40 mg Oral BID   Continuous Infusions:  sodium chloride     PRN Meds: sodium chloride, fluticasone, nitroGLYCERIN, sodium chloride flush, valACYclovir   Vital Signs    Vitals:   02/17/20 0358 02/17/20 1505 02/17/20 2009 02/18/20 0644  BP:  138/88 (!) 134/91 134/82  Pulse: (!) 102 (!) 101 79 (!) 103  Resp: 18 16 18 17   Temp: 98 F (36.7 C) (!) 97.4 F (36.3 C) 98.2 F (36.8 C) 98.3 F (36.8 C)  TempSrc: Oral Oral Oral Oral  SpO2: 98% 93% 94% 91%  Weight: 117.8 kg   121.3 kg  Height:        Intake/Output Summary (Last 24 hours) at 02/18/2020 0820 Last data filed at 02/18/2020 7672 Gross per 24 hour  Intake 1112 ml  Output 3100 ml  Net -1988 ml   Filed Weights   02/16/20 1853 02/17/20 0358 02/18/20 0644  Weight: 118.9 kg 117.8 kg 121.3 kg    Physical Exam    GEN- The patient is well appearing, alert and oriented x 3 today.   Head- normocephalic, atraumatic Eyes-  Sclera clear, conjunctiva pink Ears- hearing intact Oropharynx- clear Neck- supple Lungs- Clear to ausculation bilaterally, normal work of breathing Heart- Regular rate and rhythm, no murmurs, rubs or gallops GI- soft, NT, ND, +  BS Extremities- no clubbing, cyanosis, or edema Skin- no rash or lesion Psych- euthymic mood, full affect Neuro- strength and sensation are intact  Labs    CBC No results for input(s): WBC, NEUTROABS, HGB, HCT, MCV, PLT in the last 72 hours. Basic Metabolic Panel Recent Labs    02/17/20 0333 02/18/20 0633  NA 137 139  K 3.7 4.4  CL 98 99  CO2 25 28  GLUCOSE 147* 167*  BUN 21 16  CREATININE 1.04* 1.10*  CALCIUM 8.9 9.0  MG 2.0 2.6*    Potassium  Date/Time Value Ref Range Status  02/18/2020 06:33 AM 4.4 3.5 - 5.1 mmol/L Final   Magnesium  Date/Time Value Ref Range Status  02/18/2020 06:33 AM 2.6 (H) 1.7 - 2.4 mg/dL Final    Comment:    Performed at [facility], [place] [address], [place], [place] [address]    Telemetry    Atrial flutter 90-100s (personally reviewed)  Radiology    No results found.   Patient Profile     [name] is a 74 y.o. female with a past medical history significant for persistent atrial fibrillation.  They were admitted for tikosyn load.   Assessment &  Plan    Persistent atrial fibrillation Pt  is in atrial flutter  on Tikosyn 250 mcg BID  Continue Eliquis Electrolytes stable.  CHA2DS2VASC is at least 6.    Plan on DCCV this morning.     For questions or updates, please contact [place] Please consult [email or web address] for contact info under Cardiology/STEMI.  Signed, Shirley Friar, PA-C  02/18/2020, 8:20 AM   I have seen and examined this patient with [name].  Agree with above, note added to reflect my findings.  On exam, RRR, no murmurs, lungs clear. Converted to atrial flutter. [name] plan for cardioversion today.    Samantha Atha M. Lennix Rotundo MD 02/18/2020 8:39 AM

## 2020-02-18 NOTE — CV Procedure (Signed)
Procedure: Electrical Cardioversion Indications:  Atrial Flutter  Procedure Details:  Consent: Risks of procedure as well as the alternatives and risks of each were explained to the (patient/caregiver).  Consent for procedure obtained.  Time Out: Verified patient identification, verified procedure, site/side was marked, verified correct patient position, special equipment/implants available, medications/allergies/relevent history reviewed, required imaging and test results available. PERFORMED.  Patient placed on cardiac monitor, pulse oximetry, supplemental oxygen as necessary.  Sedation given: propofol per anesthesia Pacer pads placed anterior and posterior chest.  Cardioverted 1 time(s).  Cardioversion with synchronized biphasic 120J shock.  Evaluation: Findings: Post procedure EKG shows: NSR Complications: None Patient did tolerate procedure well.  Time Spent Directly with the Patient:  30 minutes   [name] 02/18/2020, 9:53 AM

## 2020-02-18 NOTE — Interval H&P Note (Signed)
History and Physical Interval Note:  02/18/2020 9:41 AM  Linell C Apgar  has presented today for surgery, with the diagnosis of afib.  The various methods of treatment have been discussed with the patient and family. After consideration of risks, benefits and other options for treatment, the patient has consented to  Procedure(s): CARDIOVERSION (N/A) as a surgical intervention.  The patient's history has been reviewed, patient examined, no change in status, stable for surgery.  I have reviewed the patient's chart and labs.  Questions were answered to the patient's satisfaction.     Elouise Munroe

## 2020-02-18 NOTE — Progress Notes (Signed)
Pharmacy: Dofetilide (Tikosyn) - Follow Up Assessment and Electrolyte Replacement  Pharmacy consulted to assist in monitoring and replacing electrolytes in this 74 y.o. female admitted on 02/16/2020 undergoing dofetilide initiation.  Labs:    Component Value Date/Time   K 4.4 02/18/2020 0633   MG 2.6 (H) 02/18/2020 0092     Plan: Potassium: K >/= 4: No additional supplementation needed  Magnesium: Mg > 2: No additional supplementation needed   Thank you for allowing pharmacy to participate in this patient's care   Antonietta Jewel, PharmD, [place] Pharmacist  Phone: [phone] 02/18/2020 8:15 AM  Please check AMION for all [place] phone numbers After 10:00 PM, call [place] [phone]

## 2020-02-18 NOTE — Anesthesia Preprocedure Evaluation (Signed)
Anesthesia Evaluation  Patient identified by MRN, date of birth, ID band Patient awake    Reviewed: Allergy & Precautions, NPO status , Patient's Chart, lab work & pertinent test results  Airway Mallampati: II  TM Distance: >3 FB Neck ROM: Full    Dental  (+) Teeth Intact, Dental Advisory Given   Pulmonary shortness of breath, sleep apnea and Continuous Positive Airway Pressure Ventilation , former smoker,    Pulmonary exam normal breath sounds clear to auscultation       Cardiovascular hypertension, Pt. on medications (-) angina+ CAD, + Past MI, + Cardiac Stents and +CHF  + dysrhythmias Atrial Fibrillation + pacemaker  Rhythm:Irregular Rate:Abnormal     Neuro/Psych PSYCHIATRIC DISORDERS Anxiety Depression TIACVA, No Residual Symptoms    GI/Hepatic Neg liver ROS, GERD  Medicated,  Endo/Other  diabetesMorbid obesity  Renal/GU Renal InsufficiencyRenal disease     Musculoskeletal negative musculoskeletal ROS (+)   Abdominal   Peds  Hematology  (+) Blood dyscrasia (Eliquis), ,   Anesthesia Other Findings Day of surgery medications reviewed with the patient.  Reproductive/Obstetrics                             Anesthesia Physical Anesthesia Plan  ASA: III  Anesthesia Plan: General   Post-op Pain Management:    Induction: Intravenous  PONV Risk Score and Plan: 3 and Propofol infusion and Treatment may vary due to age or medical condition  Airway Management Planned: Mask  Additional Equipment:   Intra-op Plan:   Post-operative Plan:   Informed Consent: I have reviewed the patients History and Physical, chart, labs and discussed the procedure including the risks, benefits and alternatives for the proposed anesthesia with the patient or authorized representative who has indicated his/her understanding and acceptance.     Dental advisory given  Plan Discussed with:  CRNA  Anesthesia Plan Comments:         Anesthesia Quick Evaluation

## 2020-02-18 NOTE — Anesthesia Procedure Notes (Signed)
Procedure Name: General with mask airway Date/Time: 02/18/2020 9:50 AM Performed by: Kyung Rudd, CRNA Pre-anesthesia Checklist: Patient identified, Emergency Drugs available, Suction available and Patient being monitored Patient Re-evaluated:Patient Re-evaluated prior to induction Oxygen Delivery Method: Ambu bag Preoxygenation: Pre-oxygenation with 100% oxygen Induction Type: IV induction Placement Confirmation: positive ETCO2

## 2020-02-19 LAB — BASIC METABOLIC PANEL
Anion gap: 14 (ref 5–15)
BUN: 21 mg/dL (ref 8–23)
CO2: 23 mmol/L (ref 22–32)
Calcium: 9 mg/dL (ref 8.9–10.3)
Chloride: 100 mmol/L (ref 98–111)
Creatinine, Ser: 1.12 mg/dL — ABNORMAL HIGH (ref 0.44–1.00)
GFR calc Af Amer: 56 mL/min — ABNORMAL LOW (ref >60)
GFR calc non Af Amer: 48 mL/min — ABNORMAL LOW (ref >60)
Glucose, Bld: 131 mg/dL — ABNORMAL HIGH (ref 70–99)
Potassium: 4.1 mmol/L (ref 3.5–5.1)
Sodium: 137 mmol/L (ref 135–145)

## 2020-02-19 LAB — MAGNESIUM: Magnesium: 2.3 mg/dL (ref 1.7–2.4)

## 2020-02-19 MED ORDER — DOFETILIDE 250 MCG PO CAPS: 250.0000 ug | ORAL_CAPSULE | Freq: Two times a day (BID) | ORAL | 6 refills | Status: DC

## 2020-02-19 MED FILL — DOFETILIDE 250 MCG CAPS: 250 | 30 days supply | Qty: 60 | Fill #0

## 2020-02-19 NOTE — Progress Notes (Signed)
Evening EKG reviewed  Shows has converted to NSR with DCCV at 81 bpm with stable QTc at ~490 ms.  Continue Tikosyn 250 mcg BID.   Plan for home today if QTc remains stable after morning dose.    Shirley Friar, PA-C  Pager: [phone]  02/19/2020 7:27 AM

## 2020-02-19 NOTE — Plan of Care (Signed)
  Problem: Education: Goal: Knowledge of General Education information will improve Description: Including pain rating scale, medication(s)/side effects and non-pharmacologic comfort measures Outcome: Adequate for Discharge   Problem: Health Behavior/Discharge Planning: Goal: Ability to manage health-related needs will improve Outcome: Adequate for Discharge   Problem: Clinical Measurements: Goal: Ability to maintain clinical measurements within normal limits will improve Outcome: Adequate for Discharge   Problem: Clinical Measurements: Goal: Will remain free from infection Outcome: Completed/Met Goal: Diagnostic test results will improve Outcome: Completed/Met Goal: Respiratory complications will improve Outcome: Completed/Met Goal: Cardiovascular complication will be avoided Outcome: Completed/Met   Problem: Activity: Goal: Risk for activity intolerance will decrease Outcome: Completed/Met   Problem: Nutrition: Goal: Adequate nutrition will be maintained Outcome: Completed/Met   Problem: Coping: Goal: Level of anxiety will decrease Outcome: Completed/Met   Problem: Elimination: Goal: Will not experience complications related to bowel motility Outcome: Completed/Met Goal: Will not experience complications related to urinary retention Outcome: Completed/Met   Problem: Pain Managment: Goal: General experience of comfort will improve Outcome: Completed/Met   Problem: Safety: Goal: Ability to remain free from injury will improve Outcome: Completed/Met   Problem: Skin Integrity: Goal: Risk for impaired skin integrity will decrease Outcome: Completed/Met

## 2020-02-19 NOTE — Progress Notes (Signed)
Pharmacy: Dofetilide (Tikosyn) - Follow Up Assessment and Electrolyte Replacement  Pharmacy consulted to assist in monitoring and replacing electrolytes in this 74 y.o. female admitted on 02/16/2020 undergoing dofetilide initiation.  Labs:    Component Value Date/Time   K 4.1 02/19/2020 0412   MG 2.3 02/19/2020 0412     Plan: Potassium: K >/= 4: No additional supplementation needed  Magnesium: Mg > 2: No additional supplementation needed   Thank you for allowing pharmacy to participate in this patient's care   Arrie Senate, PharmD, BCPS Clinical Pharmacist [phone] Please check AMION for all [place] numbers 02/19/2020

## 2020-02-19 NOTE — Discharge Summary (Addendum)
ELECTROPHYSIOLOGY PROCEDURE DISCHARGE SUMMARY    Patient ID: [name],  MRN: [id number], DOB/AGE: 1946/07/02 74 y.o.  Admit date: 02/16/2020 Discharge date: 02/19/2020  Primary Care Physician: [name], MD  Primary Cardiologist: Mertie Moores, MD  Electrophysiologist: Constance Haw, MD   Primary Discharge Diagnosis:  1.  Persistent atrial fibrillation status post Tikosyn loading this admission  Secondary Discharge Diagnosis:  2. Chronic diastolic CHF 3. PPM 4. CKD stage 3a   Allergies  Allergen Reactions  . Adhesive [Tape] Rash and Other (See Comments)    Allergic to defibrillation pads.     Procedures This Admission:  1.  Tikosyn loading 2.  Direct current cardioversion on Wednesday February 18, 2020  by Dr Margaretann Loveless which successfully restored SR.  There were no early apparent complications.   Brief HPI: IRJA WHELESS is a 74 y.o. female with a past medical history as noted above.  They were referred to EP in the outpatient setting for treatment options of atrial fibrillation.  Risks, benefits, and alternatives to Tikosyn were reviewed with the patient who wished to proceed.    Hospital Course:  The patient was admitted and Tikosyn was initiated.  Renal function and electrolytes were followed during the hospitalization.  Their QTc remained stable.  On 02/18/2020 they underwent direct current cardioversion which restored sinus rhythm.  They were monitored until discharge on telemetry which demonstrated NSR.  On the day of discharge, they were examined by Dr. Curt Bears  who considered them stable for discharge to home.  Follow-up has been arranged with the Atrial Fibrillation clinic in approximately 1 week and with Dr. Curt Bears  in 4 weeks.   Physical Exam: Vitals:   02/18/20 1038 02/18/20 1232 02/18/20 2121 02/19/20 0515  BP: 125/79 100/62 (!) 145/84 (!) 133/58  Pulse:  78 79 76  Resp:  20 18   Temp:  98.2 F (36.8 C) 98 F (36.7 C) 98.3 F (36.8 C)    TempSrc:  Oral Oral Oral  SpO2:  94% 90% 90%  Weight:    121.7 kg  Height:        GEN- The patient is well appearing, alert and oriented x 3 today.   HEENT: normocephalic, atraumatic; sclera clear, conjunctiva pink; hearing intact; oropharynx clear; neck supple, no JVP Lymph- no cervical lymphadenopathy Lungs- Clear to ausculation bilaterally, normal work of breathing.  No wheezes, rales, rhonchi Heart- Regular rate and rhythm, no murmurs, rubs or gallops, PMI not laterally displaced GI- soft, non-tender, non-distended, bowel sounds present, no hepatosplenomegaly Extremities- no clubbing, cyanosis, or edema; DP/PT/radial pulses 2+ bilaterally MS- no significant deformity or atrophy Skin- warm and dry, no rash or lesion Psych- euthymic mood, full affect Neuro- strength and sensation are intact   Labs:   Lab Results  Component Value Date   WBC 8.5 01/16/2020   HGB 15.3 (H) 01/22/2020   HCT 45.0 01/22/2020   MCV 99.8 01/16/2020   PLT 236 01/16/2020    Recent Labs  Lab 02/19/20 0412  NA 137  K 4.1  CL 100  CO2 23  BUN 21  CREATININE 1.12*  CALCIUM 9.0  GLUCOSE 131*     Discharge Medications:  Allergies as of 02/19/2020       Reactions   Adhesive [tape] Rash, Other (See Comments)   Allergic to defibrillation pads.        Medication List     TAKE these medications    atorvastatin 40 MG tablet Commonly known as:  LIPITOR TAKE 1 TABLET BY MOUTH EVERY DAY   B-12 5000 MCG Caps Take 5,000 mcg by mouth daily.   CALCIUM 600+D PO Take 1 tablet by mouth daily.   diltiazem 240 MG 24 hr capsule Commonly known as: CARDIZEM CD TAKE 1 CAPSULE BY MOUTH EVERY DAY **DOSE INCREASE** What changed: See the new instructions.   dofetilide 250 MCG capsule Commonly known as: TIKOSYN Take 1 capsule (250 mcg total) by mouth 2 (two) times daily.   DULoxetine 60 MG capsule Commonly known as: Cymbalta Take 1 capsule (60 mg total) by mouth daily.   Eliquis 5 MG Tabs  tablet Generic drug: apixaban TAKE 1 TABLET BY MOUTH TWICE A DAY What changed: how much to take   fluticasone 50 MCG/ACT nasal spray Commonly known as: FLONASE Place 1 spray into both nostrils daily as needed for allergies.   Klor-Con M20 20 MEQ tablet Generic drug: potassium chloride SA TAKE 1 TABLET BY MOUTH TWICE A DAY What changed: how much to take   loratadine 10 MG tablet Commonly known as: CLARITIN TAKE 1 TABLET BY MOUTH EVERY DAY   Lysine 1000 MG Tabs Take 1,000 mg by mouth daily.   multivitamin with minerals Tabs tablet Take 1 tablet by mouth daily. Centrum   nitroGLYCERIN 0.4 MG SL tablet Commonly known as: NITROSTAT PLACE 1 TABLET (0.4 MG TOTAL) UNDER THE TONGUE EVERY 5 (FIVE) MINUTES X 3 DOSES AS NEEDED FOR CHEST PAIN.   nystatin-triamcinolone cream Commonly known as: MYCOLOG II Apply 1 application topically 2 (two) times daily as needed.   omeprazole 20 MG capsule Commonly known as: PRILOSEC Take 20 mg by mouth daily before breakfast.   Systane 0.4-0.3 % Soln Generic drug: Polyethyl Glycol-Propyl Glycol Place 1 drop into both eyes 2 (two) times daily.   torsemide 20 MG tablet Commonly known as: DEMADEX Take 2 tablets (40 mg total) by mouth 2 (two) times daily.   valACYclovir 1000 MG tablet Commonly known as: VALTREX Take 2 tablets (2,000 mg total) by mouth every 12 (twelve) hours as needed (fever blisters/cold sores.).   vitamin C 1000 MG tablet Take 1,000 mg by mouth daily.   Vitamin D3 125 MCG (5000 UT) Tabs Take 5,000 Units by mouth daily.   vitamin E 1000 UNIT capsule Take 1,000 Units by mouth daily.        Disposition:     Follow-up Information     Constance Haw, MD Follow up on 03/30/2020.   Specialty: Cardiology Why: at 300 pm for pacemaker and tikosyn follow up Contact information: [address] [place] [address] [place] Follow up on 02/27/2020.    Specialty: Cardiology Why: at 0930 for post tikosyn follow up. Parking code is 4008 for August Contact information: [address] [id number] [place] [phone]                Duration of Discharge Encounter: Greater than 30 minutes including physician time.  Signed, Shirley Friar, PA-C  02/19/2020 12:12 PM    I have seen and examined this patient with [name].  Agree with above, note added to reflect my findings.  On exam, RRR, no murmurs, lungs clear. Patient admitted with atypical atrial flutter and atrial fibrillation. Post tikosyn load and DCCV. Remains in sinus rhythm. Discharge today with follow up in clinic.    Mayari Matus M. Dayanna Pryce MD 02/20/2020 7:20 AM

## 2020-02-20 ENCOUNTER — Encounter (HOSPITAL_COMMUNITY): Payer: Self-pay | Admitting: Internal Medicine

## 2020-02-27 ENCOUNTER — Other Ambulatory Visit: Payer: Self-pay

## 2020-02-27 ENCOUNTER — Ambulatory Visit (HOSPITAL_COMMUNITY)
Admit: 2020-02-27 | Discharge: 2020-02-27 | Disposition: A | Discharge status: Home / Self Care | Payer: PPO | Attending: Nurse Practitioner | Admitting: Nurse Practitioner

## 2020-02-27 ENCOUNTER — Encounter (HOSPITAL_COMMUNITY): Payer: Self-pay | Admitting: Nurse Practitioner

## 2020-02-27 VITALS — BP 130/88 | HR 84 | Ht 66.0 in | Wt 268.4 lb

## 2020-02-27 DIAGNOSIS — D6869 Other thrombophilia: Secondary | ICD-10-CM | POA: Diagnosis not present

## 2020-02-27 DIAGNOSIS — I2510 Atherosclerotic heart disease of native coronary artery without angina pectoris: Secondary | ICD-10-CM | POA: Insufficient documentation

## 2020-02-27 DIAGNOSIS — I5032 Chronic diastolic (congestive) heart failure: Secondary | ICD-10-CM | POA: Insufficient documentation

## 2020-02-27 DIAGNOSIS — M1990 Unspecified osteoarthritis, unspecified site: Secondary | ICD-10-CM | POA: Diagnosis not present

## 2020-02-27 DIAGNOSIS — N189 Chronic kidney disease, unspecified: Secondary | ICD-10-CM | POA: Insufficient documentation

## 2020-02-27 DIAGNOSIS — I4892 Unspecified atrial flutter: Secondary | ICD-10-CM | POA: Diagnosis not present

## 2020-02-27 DIAGNOSIS — E669 Obesity, unspecified: Secondary | ICD-10-CM | POA: Diagnosis not present

## 2020-02-27 DIAGNOSIS — Z8673 Personal history of transient ischemic attack (TIA), and cerebral infarction without residual deficits: Secondary | ICD-10-CM | POA: Insufficient documentation

## 2020-02-27 DIAGNOSIS — Z87891 Personal history of nicotine dependence: Secondary | ICD-10-CM | POA: Insufficient documentation

## 2020-02-27 DIAGNOSIS — F329 Major depressive disorder, single episode, unspecified: Secondary | ICD-10-CM | POA: Insufficient documentation

## 2020-02-27 DIAGNOSIS — G4733 Obstructive sleep apnea (adult) (pediatric): Secondary | ICD-10-CM | POA: Diagnosis not present

## 2020-02-27 DIAGNOSIS — Z79899 Other long term (current) drug therapy: Secondary | ICD-10-CM | POA: Diagnosis not present

## 2020-02-27 DIAGNOSIS — Z950 Presence of cardiac pacemaker: Secondary | ICD-10-CM | POA: Insufficient documentation

## 2020-02-27 DIAGNOSIS — I480 Paroxysmal atrial fibrillation: Secondary | ICD-10-CM | POA: Diagnosis not present

## 2020-02-27 DIAGNOSIS — I252 Old myocardial infarction: Secondary | ICD-10-CM | POA: Insufficient documentation

## 2020-02-27 DIAGNOSIS — Z7901 Long term (current) use of anticoagulants: Secondary | ICD-10-CM | POA: Insufficient documentation

## 2020-02-27 DIAGNOSIS — E785 Hyperlipidemia, unspecified: Secondary | ICD-10-CM | POA: Insufficient documentation

## 2020-02-27 DIAGNOSIS — I484 Atypical atrial flutter: Secondary | ICD-10-CM

## 2020-02-27 DIAGNOSIS — Z85828 Personal history of other malignant neoplasm of skin: Secondary | ICD-10-CM | POA: Insufficient documentation

## 2020-02-27 DIAGNOSIS — F419 Anxiety disorder, unspecified: Secondary | ICD-10-CM | POA: Diagnosis not present

## 2020-02-27 DIAGNOSIS — I130 Hypertensive heart and chronic kidney disease with heart failure and stage 1 through stage 4 chronic kidney disease, or unspecified chronic kidney disease: Secondary | ICD-10-CM | POA: Diagnosis not present

## 2020-02-27 LAB — BASIC METABOLIC PANEL
Anion gap: 13 (ref 5–15)
BUN: 14 mg/dL (ref 8–23)
CO2: 27 mmol/L (ref 22–32)
Calcium: 9.7 mg/dL (ref 8.9–10.3)
Chloride: 99 mmol/L (ref 98–111)
Creatinine, Ser: 0.94 mg/dL (ref 0.44–1.00)
GFR calc Af Amer: >60 mL/min (ref >60)
GFR calc non Af Amer: 60 mL/min — ABNORMAL LOW (ref >60)
Glucose, Bld: 141 mg/dL — ABNORMAL HIGH (ref 70–99)
Potassium: 4.3 mmol/L (ref 3.5–5.1)
Sodium: 139 mmol/L (ref 135–145)

## 2020-02-27 LAB — MAGNESIUM: Magnesium: 2.2 mg/dL (ref 1.7–2.4)

## 2020-02-27 NOTE — Progress Notes (Signed)
Primary Care Physician: [name], MD Referring Physician:Dr. [name] is a 74 y.o. female with a h/o PPM, chronic diastolic HF,  paroxysmal  Afib/flutter  that is in the afib clinic for persistent  afib for the last 2 weeks. She  feels more shortness of breath and has noted increase in pedal edema. She has had 3 prior Ablations, most recent 03/14/2019.  She had had both covid shots and has not missed any anticoagulation with a CHA2DS2VASc of 3.   F/u in afib clinic, 7/30, she had a successful CV, 7/22. Unfortunately had ERAF. She has few choices going forward.. She had tikosyn in the past but it was stopped when she had an infection several years  ago, the pt thinks it was to treat pnumonia . She failed amio after [employer]. She had had 3 ablations and several cardioversion's. I discussed with Dr. Curt Bears. He is not in favor of repeat ablation, he suggested reloading of tikosyn. AV nodal ablation is a possibility as she already has a PPM, but he prefer this to be her last option. Her qt is at 471 ms today and she is on Prozac. She  will contact her PCP to see if she can be switched to Cymbalta, from Prozac,  preferred antidepressant with tikosyn. She is using benadryl now and this will need to be stopped.   F/u in afib clinic 8/27, as she now one week s/p Tikosyn loading. She underwent cardioversion after Tikosyn load and left the hospital in [place]. She reports that she feels improved. EKG shows Sinus rhythm with stable qt.   Today, she denies symptoms of palpitations, chest pain, shortness of breath, orthopnea, PND, lower extremity edema, dizziness, presyncope, syncope, or neurologic sequela. The patient is tolerating medications without difficulties and is otherwise without complaint today.   Past Medical History:  Diagnosis Date  . Anticoagulant long-term use    pradaxa  . Anxiety   . Arthritis    "fingers, lower back" (04/23/2017   . CAD (coronary artery disease) 1950,9326    post PTCA with bare-metal stenting to mid RCA in December 2004     . CHF (congestive heart failure) ([place])   . Chronic atrial fibrillation ([place]) 06/2007   Tachybradycardia pacemaker  . Chronic kidney disease    10% function - ?R, other kidney is compensating    . CVA (cerebral vascular accident) [facility]) [phone]   denies residual on 04/23/2017  . Depression   . Diplopia 06/19/2008   Qualifier: Diagnosis of  By: [name] MD, [name]   . Dysrhythmia    ATRIAL FIBRILATION  . Edema of lower extremity   . Hyperlipidemia   . Hypertension   . Inferior myocardial infarction [facility])    acute inferior wall mi/other medical hx  . Myocardial infarction ([place]) [id number]  . Obesity   . OSA on CPAP    last test- 2010  . Pacemaker   . Pneumonia 2014   tx. ----  [facility]  . Pulmonary hypertension ([place])    moderate pulmonary hypertension by 10/2016 echo and 10/2013 cardiac cath  . Shortness of breath   . Skin cancer    "cut off right [place]; burned off LLE" (04/23/2017)  . Sleep apnea   . Spondylolisthesis   . TIA (transient ischemic attack) 2008  . Unspecified hemorrhoids without mention of complication 9/83/3825   Colonoscopy--Dr. Carlean Purl    Past Surgical History:  Procedure Laterality Date  . ABDOMINAL HYSTERECTOMY    .  APPENDECTOMY  1984  . ATRIAL FIBRILLATION ABLATION N/A 09/29/2016   Procedure: Atrial Fibrillation Ablation;  Surgeon: Will Meredith Leeds, MD;  Location: [place] CV LAB;  Service: Cardiovascular;  Laterality: N/A;  . ATRIAL FIBRILLATION ABLATION N/A 02/07/2018   Procedure: ATRIAL FIBRILLATION ABLATION;  Surgeon: Constance Haw, MD;  Location: [place] CV LAB;  Service: Cardiovascular;  Laterality: N/A;  . ATRIAL FIBRILLATION ABLATION N/A 03/13/2019   Procedure: ATRIAL FIBRILLATION ABLATION;  Surgeon: Constance Haw, MD;  Location: [place] CV LAB;  Service: Cardiovascular;  Laterality: N/A;  . BACK SURGERY    . CARDIOVERSION N/A 09/12/2017    Procedure: CARDIOVERSION;  Surgeon: Jerline Pain, MD;  Location: [facility] ENDOSCOPY;  Service: Cardiovascular;  Laterality: N/A;  . CARDIOVERSION N/A 12/13/2017   Procedure: CARDIOVERSION;  Surgeon: Sanda Klein, MD;  Location: [place] ENDOSCOPY;  Service: Cardiovascular;  Laterality: N/A;  . CARDIOVERSION N/A 02/18/2018   Procedure: CARDIOVERSION;  Surgeon: Dorothy Spark, MD;  Location: [facility] ENDOSCOPY;  Service: Cardiovascular;  Laterality: N/A;  . CARDIOVERSION N/A 05/20/2018   Procedure: CARDIOVERSION;  Surgeon: [name], MD;  Location: [facility] ENDOSCOPY;  Service: Cardiovascular;  Laterality: N/A;  . CARDIOVERSION N/A 04/16/2019   Procedure: CARDIOVERSION;  Surgeon: Donato Heinz, MD;  Location: [place];  Service: Endoscopy;  Laterality: N/A;  . CARDIOVERSION N/A 01/22/2020   Procedure: CARDIOVERSION;  Surgeon: Geralynn Rile, MD;  Location: [place];  Service: Cardiovascular;  Laterality: N/A;  . CARDIOVERSION N/A 02/18/2020   Procedure: CARDIOVERSION;  Surgeon: Elouise Munroe, MD;  Location: [facility] ENDOSCOPY;  Service: Cardiovascular;  Laterality: N/A;  . CATARACT EXTRACTION W/ INTRAOCULAR LENS  IMPLANT, BILATERAL Bilateral 01/15/2017- 03/2017  . CHOLECYSTECTOMY    . CORONARY ANGIOPLASTY  X 2  . CORONARY ANGIOPLASTY WITH STENT PLACEMENT  1998; ~ 2007; ?date   "1 stent; replaced stent; not sure when I got the last stent" (04/23/2017)  . DOPPLER ECHOCARDIOGRAPHY  2009  . ELECTROPHYSIOLOGIC STUDY N/A 03/31/2016   Procedure: Cardioversion;  Surgeon: Evans Lance, MD;  Location: [place] CV LAB;  Service: Cardiovascular;  Laterality: N/A;  . ELECTROPHYSIOLOGIC STUDY N/A 08/04/2016   Procedure: Cardioversion;  Surgeon: Evans Lance, MD;  Location: [place] CV LAB;  Service: Cardiovascular;  Laterality: N/A;  . INSERT / REPLACE / REMOVE PACEMAKER  06/2007  . IR RADIOLOGY PERIPHERAL GUIDED IV START  01/31/2018  . IR US GUIDE VASC ACCESS LEFT  01/31/2018  . JOINT  REPLACEMENT    . LAPAROSCOPIC CHOLECYSTECTOMY  1994  . LEFT AND RIGHT HEART CATHETERIZATION WITH CORONARY ANGIOGRAM N/A 10/06/2013   Procedure: LEFT AND RIGHT HEART CATHETERIZATION WITH CORONARY ANGIOGRAM;  Surgeon: Troy Sine, MD;  Location: [facility] CATH LAB;  Service: Cardiovascular;  Laterality: N/A;  . LEFT OOPHORECTOMY Left ~ 1989  . POSTERIOR LUMBAR FUSION  2000s - 04/2015 X 3   L3-4; L4-5; L2-3; Dr Trenton Gammon  . RIGHT/LEFT HEART CATH AND CORONARY ANGIOGRAPHY N/A 05/09/2017   Procedure: RIGHT/LEFT HEART CATH AND CORONARY ANGIOGRAPHY;  Surgeon: Sherren Mocha, MD;  Location: [place] CV LAB;  Service: Cardiovascular;  Laterality: N/A;  . SKIN CANCER EXCISION Right    [place]  . TEE WITHOUT CARDIOVERSION N/A 09/29/2016   Procedure: TRANSESOPHAGEAL ECHOCARDIOGRAM (TEE);  Surgeon: Jerline Pain, MD;  Location: [place];  Service: Cardiovascular;  Laterality: N/A;  . TOTAL ABDOMINAL HYSTERECTOMY  1984   "uterus & right ovary"  . TOTAL KNEE ARTHROPLASTY Left 04/23/2017  . TOTAL KNEE ARTHROPLASTY Left  04/23/2017   Procedure: TOTAL KNEE ARTHROPLASTY;  Surgeon: Frederik Pear, MD;  Location: [place];  Service: Orthopedics;  Laterality: Left;  . ULTRASOUND GUIDANCE FOR VASCULAR ACCESS  05/09/2017   Procedure: Ultrasound Guidance For Vascular Access;  Surgeon: Sherren Mocha, MD;  Location: [place] CV LAB;  Service: Cardiovascular;;    Current Outpatient Medications  Medication Sig Dispense Refill  . Ascorbic Acid (VITAMIN C) 1000 MG tablet Take 1,000 mg by mouth daily.     [name] atorvastatin (LIPITOR) 40 MG tablet TAKE 1 TABLET BY MOUTH EVERY DAY (Patient taking differently: Take 40 mg by mouth daily. ) 90 tablet 3  . Calcium Carbonate-Vitamin D (CALCIUM 600+D PO) Take 1 tablet by mouth daily.     . Cholecalciferol (VITAMIN D3) 125 MCG (5000 UT) TABS Take 5,000 Units by mouth daily.     . Cyanocobalamin (B-12) 5000 MCG CAPS Take 5,000 mcg by mouth daily.    [name] diltiazem (CARDIZEM CD) 240 MG 24 hr  capsule TAKE 1 CAPSULE BY MOUTH EVERY DAY **DOSE INCREASE** (Patient taking differently: Take 240 mg by mouth daily. ) 90 capsule 2  . dofetilide (TIKOSYN) 250 MCG capsule Take 1 capsule (250 mcg total) by mouth 2 (two) times daily. 60 capsule 6  . DULoxetine (CYMBALTA) 60 MG capsule Take 1 capsule (60 mg total) by mouth daily. 90 capsule 0  . ELIQUIS 5 MG TABS tablet TAKE 1 TABLET BY MOUTH TWICE A DAY (Patient taking differently: Take 5 mg by mouth 2 (two) times daily. ) 180 tablet 1  . fluticasone (FLONASE) 50 MCG/ACT nasal spray Place 1 spray into both nostrils daily as needed for allergies.     [name] KLOR-CON M20 20 MEQ tablet TAKE 1 TABLET BY MOUTH TWICE A DAY (Patient taking differently: Take 20 mEq by mouth 2 (two) times daily. ) 60 tablet 3  . loratadine (CLARITIN) 10 MG tablet TAKE 1 TABLET BY MOUTH EVERY DAY (Patient taking differently: Take 10 mg by mouth daily. ) 90 tablet 1  . Lysine 1000 MG TABS Take 1,000 mg by mouth daily.     . Multiple Vitamin (MULTIVITAMIN WITH MINERALS) TABS tablet Take 1 tablet by mouth daily. Centrum    . nitroGLYCERIN (NITROSTAT) 0.4 MG SL tablet PLACE 1 TABLET (0.4 MG TOTAL) UNDER THE TONGUE EVERY 5 (FIVE) MINUTES X 3 DOSES AS NEEDED FOR CHEST PAIN. 75 tablet 1  . nystatin-triamcinolone (MYCOLOG II) cream Apply 1 application topically 2 (two) times daily as needed. 30 g 1  . omeprazole (PRILOSEC) 20 MG capsule Take 20 mg by mouth daily before breakfast.     . Polyethyl Glycol-Propyl Glycol (SYSTANE) 0.4-0.3 % SOLN Place 1 drop into both eyes 2 (two) times daily.     [name] torsemide (DEMADEX) 20 MG tablet Take 2 tablets (40 mg total) by mouth 2 (two) times daily. 360 tablet 3  . valACYclovir (VALTREX) 1000 MG tablet Take 2 tablets (2,000 mg total) by mouth every 12 (twelve) hours as needed (fever blisters/cold sores.). 30 tablet 1  . vitamin E 1000 UNIT capsule Take 1,000 Units by mouth daily.      No current facility-administered medications for this encounter.     Allergies  Allergen Reactions  . Adhesive [Tape] Rash and Other (See Comments)    Allergic to defibrillation pads.    Social History   Socioeconomic History  . Marital status: Married    Spouse name: Not on file  . Number of children: 0  . Years of education:  HS  . Highest education level: Not on file  Occupational History  . Occupation: retired    Comment: previously worked [employer]  . Smoking status: Former Smoker    Packs/day: 1.00    Years: 5.00    Pack years: 5.00    Types: Cigarettes    Start date: 05/11/1978    Quit date: 07/03/1982    Years since quitting: 37.6  . Smokeless tobacco: Never Used  Vaping Use  . Vaping Use: Never used  Substance and Sexual Activity  . Alcohol use: Yes    Comment: 04/23/2017 "once q 6 months; glass of wine"  . Drug use: No  . Sexual activity: Not Currently  Other Topics Concern  . 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   Married    Originally from [place] of two, high school education   Former smoker 1979-1984 1ppd   Hunting dogs 7    Retired from [employer] 2    Social Determinants of Radio broadcast assistant Strain:   . Difficulty of Paying Living Expenses: Not on file  Food Insecurity:   . Worried About Charity fundraiser in the Last Year: Not on file  . Ran Out of Food in the Last Year: Not on file  Transportation Needs: No Transportation Needs  . Lack of Transportation (Medical): No  . Lack of Transportation (Non-Medical): No  Physical Activity:   . Days of Exercise per Week: Not on file  . Minutes of Exercise per Session: Not on file  Stress:   . Feeling of Stress : Not on file  Social Connections:   . Frequency of Communication with Friends and Family: Not on file  . Frequency of Social Gatherings with Friends and Family: Not on file  . Attends Religious Services: Not on file  . Active  Member of Clubs or Organizations: Not on file  . Attends Archivist Meetings: Not on file  . Marital Status: Not on file  Intimate Partner Violence:   . Fear of Current or Ex-Partner: Not on file  . Emotionally Abused: Not on file  . Physically Abused: Not on file  . Sexually Abused: Not on file    Family History  Problem Relation Age of Onset  . Suicidality Father        suicide death pt was 3 yrs, 64  . Arrhythmia Mother   . Hypertension Mother   . Diabetes Mother   . Dementia Mother   . Heart attack Brother   . Heart disease Paternal Aunt   . Prostate cancer Maternal Grandfather   . Diabetes Paternal Grandfather        fathers side of the family  . Colon cancer Maternal Aunt     ROS- All systems are reviewed and negative except as per the HPI above  Physical Exam: Vitals:   02/27/20 0933  Weight: 121.7 kg  Height: 5\' 6"  (1.676 m)   Wt Readings from Last 3 Encounters:  02/27/20 121.7 kg  02/19/20 121.7 kg  02/16/20 123.1 kg    Labs: Lab Results  Component Value Date   NA 137 02/19/2020   K 4.1 02/19/2020   CL 100 02/19/2020   CO2 23 02/19/2020   GLUCOSE 131 (H) 02/19/2020   BUN 21 02/19/2020   CREATININE 1.12 (H) 02/19/2020   CALCIUM 9.0 02/19/2020  PHOS 4.0 06/01/2018   MG 2.3 02/19/2020   Lab Results  Component Value Date   INR 1.3 (H) 02/17/2020   Lab Results  Component Value Date   CHOL 151 04/10/2019   HDL 50.80 04/10/2019   LDLCALC 68 04/10/2019   TRIG 160.0 (H) 04/10/2019     GEN- The patient is well appearing, alert and oriented x 3 today.   Head- normocephalic, atraumatic Eyes-  Sclera clear, conjunctiva pink Ears- hearing intact Oropharynx- clear Neck- supple, no JVP Lymph- no cervical lymphadenopathy Lungs- Clear to ausculation bilaterally, normal work of breathing Heart- regular rate and rhythm, no murmurs, rubs or gallops, PMI not laterally displaced GI- soft, NT, ND, + BS Extremities- no clubbing, cyanosis, or  edema MS- no significant deformity or atrophy Skin- no rash or lesion Psych- euthymic mood, full affect Neuro- strength and sensation are intact  EKG- NSR at 84 bpm, pr int 162 ms, qrs int 78 ms, qtc 451 ms    Assessment and Plan: 1. Persistent  atrial flutter  Recent cardioversion with ERAF  Recent reloading of tikosyn Successful cardioversion  Continues  in SR today  Feels improved  Reminded no benadryl  Will look into local [employer] to see if she can get thru good rx and we will call in  RX.  Continue diltiazem 240 daily  2. CHA2DS2VASc score of 3 Continue eliquis 5 mg bid   3. Diastolic HF Stable  Torsemide  40 mg bid   F/u with Dr. Curt Bears 9/28  Geroge Baseman. Eriel Dunckel, [facility] [address] [place], [place] [address] [phone]

## 2020-03-01 ENCOUNTER — Other Ambulatory Visit (HOSPITAL_COMMUNITY): Payer: Self-pay | Admitting: *Deleted

## 2020-03-01 MED ORDER — DOFETILIDE 250 MCG PO CAPS: 250.0000 ug | ORAL_CAPSULE | Freq: Two times a day (BID) | ORAL | 2 refills | Status: DC

## 2020-03-13 ENCOUNTER — Other Ambulatory Visit: Payer: Self-pay | Admitting: Cardiovascular Disease

## 2020-03-18 ENCOUNTER — Telehealth: Payer: PPO

## 2020-03-25 ENCOUNTER — Other Ambulatory Visit: Payer: Self-pay | Admitting: Cardiology

## 2020-03-30 ENCOUNTER — Other Ambulatory Visit: Payer: Self-pay

## 2020-03-30 ENCOUNTER — Encounter: Payer: Self-pay | Admitting: Cardiology

## 2020-03-30 ENCOUNTER — Ambulatory Visit: Payer: PPO | Admitting: Cardiology

## 2020-03-30 VITALS — BP 128/84 | HR 134 | Ht 66.5 in | Wt 271.0 lb

## 2020-03-30 DIAGNOSIS — I484 Atypical atrial flutter: Secondary | ICD-10-CM

## 2020-03-30 MED ORDER — DILTIAZEM HCL ER COATED BEADS 360 MG PO TB24: 360.0000 mg | ORAL_TABLET | Freq: Every day | ORAL | 3 refills | Status: DC

## 2020-03-30 NOTE — Patient Instructions (Addendum)
Medication Instructions:  Your physician has recommended you make the following change in your medication:  1. INCREASE Diltiazem to 360 mg once daily  *If you need a refill on your cardiac medications before your next appointment, please call your pharmacy*   Lab Work: None ordered   Testing/Procedures: None ordered   Follow-Up: At [facility], you and your health needs are our priority.  As part of our continuing mission to provide you with exceptional heart care, we have created designated Provider Care Teams.  These Care Teams include your primary Cardiologist (physician) and Advanced Practice Providers (APPs -  Physician Assistants and Nurse Practitioners) who all work together to provide you with the care you need, when you need it.   Your next appointment:   6 week(s)  The format for your next appointment:   In Person  Provider:   Allegra Lai, MD    Thank you for choosing [facility]!!   Trinidad Curet, RN [phone]

## 2020-03-30 NOTE — Progress Notes (Signed)
Electrophysiology Office Note   Date:  03/30/2020   ID:  Samantha, [name] 21-Jan-1946, MRN [id number]  PCP:  [name], MD  Primary Electrophysiologist: Gaye Alken, MD    No chief complaint on file.    History of Present Illness: [name] is a 74 y.o. female who presents today for electrophysiology evaluation.     She has a history of coronary artery disease status post RCA stent, CHF, atrial fibrillation with tachybradycardia syndrome status post pacemaker, CKD, hypertension, hyperlipidemia, inferior wall MI, OSA on CPAP, and CVA.  She has had AF ablation x3.  She was loaded on dofetilide recently but unfortunately has gone back into atrial flutter.  Unfortunately, she has gone back into atrial flutter.   Today, denies symptoms of palpitations, chest pain, shortness of breath, orthopnea, PND, lower extremity edema, claudication, dizziness, presyncope, syncope, bleeding, or neurologic sequela. The patient is tolerating medications without difficulties.  In atrial flutter, there are days that she feels completely normal and other days where she feels weak and fatigued.  She says on the days that she feels fatigued, her heart rate is quite fast.  On the days she is feeling normal, her heart rate is in the 80s.  She feels better on the [place] and then she did without it.  Past Medical History:  Diagnosis Date  . Anticoagulant long-term use    pradaxa  . Anxiety   . Arthritis    "fingers, lower back" (04/23/2017   . CAD (coronary artery disease) 2426,8341   post PTCA with bare-metal stenting to mid RCA in December 2004     . CHF (congestive heart failure) ([place])   . Chronic atrial fibrillation ([place]) 06/2007   Tachybradycardia pacemaker  . Chronic kidney disease    10% function - ?R, other kidney is compensating    . CVA (cerebral vascular accident) [facility]) [id number]   denies residual on 04/23/2017  . Depression   . Diplopia 06/19/2008   Qualifier: Diagnosis  of  By: [name] MD, [name]   . Dysrhythmia    ATRIAL FIBRILATION  . Edema of lower extremity   . Hyperlipidemia   . Hypertension   . Inferior myocardial infarction [facility])    acute inferior wall mi/other medical hx  . Myocardial infarction ([place]) [id number]  . Obesity   . OSA on CPAP    last test- 2010  . Pacemaker   . Pneumonia 2014   tx. ----  [facility]  . Pulmonary hypertension ([place])    moderate pulmonary hypertension by 10/2016 echo and 10/2013 cardiac cath  . Shortness of breath   . Skin cancer    "cut off right [place]; burned off LLE" (04/23/2017)  . Sleep apnea   . Spondylolisthesis   . TIA (transient ischemic attack) 2008  . Unspecified hemorrhoids without mention of complication 8/92/1194   Colonoscopy--Dr. Carlean Purl    Past Surgical History:  Procedure Laterality Date  . ABDOMINAL HYSTERECTOMY    . APPENDECTOMY  1984  . ATRIAL FIBRILLATION ABLATION N/A 09/29/2016   Procedure: Atrial Fibrillation Ablation;  Surgeon: Aymee Fomby Meredith Leeds, MD;  Location: [place] CV LAB;  Service: Cardiovascular;  Laterality: N/A;  . ATRIAL FIBRILLATION ABLATION N/A 02/07/2018   Procedure: ATRIAL FIBRILLATION ABLATION;  Surgeon: Constance Haw, MD;  Location: [place] CV LAB;  Service: Cardiovascular;  Laterality: N/A;  . ATRIAL FIBRILLATION ABLATION N/A 03/13/2019   Procedure: ATRIAL FIBRILLATION ABLATION;  Surgeon: Constance Haw, MD;  Location:  [place] INVASIVE CV LAB;  Service: Cardiovascular;  Laterality: N/A;  . BACK SURGERY    . CARDIOVERSION N/A 09/12/2017   Procedure: CARDIOVERSION;  Surgeon: Jerline Pain, MD;  Location: [facility] ENDOSCOPY;  Service: Cardiovascular;  Laterality: N/A;  . CARDIOVERSION N/A 12/13/2017   Procedure: CARDIOVERSION;  Surgeon: Sanda Klein, MD;  Location: [facility] ENDOSCOPY;  Service: Cardiovascular;  Laterality: N/A;  . CARDIOVERSION N/A 02/18/2018   Procedure: CARDIOVERSION;  Surgeon: Dorothy Spark, MD;  Location: [facility] ENDOSCOPY;  Service:  Cardiovascular;  Laterality: N/A;  . CARDIOVERSION N/A 05/20/2018   Procedure: CARDIOVERSION;  Surgeon: [name], MD;  Location: [facility] ENDOSCOPY;  Service: Cardiovascular;  Laterality: N/A;  . CARDIOVERSION N/A 04/16/2019   Procedure: CARDIOVERSION;  Surgeon: Donato Heinz, MD;  Location: [place];  Service: Endoscopy;  Laterality: N/A;  . CARDIOVERSION N/A 01/22/2020   Procedure: CARDIOVERSION;  Surgeon: Geralynn Rile, MD;  Location: [place];  Service: Cardiovascular;  Laterality: N/A;  . CARDIOVERSION N/A 02/18/2020   Procedure: CARDIOVERSION;  Surgeon: Elouise Munroe, MD;  Location: [facility] ENDOSCOPY;  Service: Cardiovascular;  Laterality: N/A;  . CATARACT EXTRACTION W/ INTRAOCULAR LENS  IMPLANT, BILATERAL Bilateral 01/15/2017- 03/2017  . CHOLECYSTECTOMY    . CORONARY ANGIOPLASTY  X 2  . CORONARY ANGIOPLASTY WITH STENT PLACEMENT  1998; ~ 2007; ?date   "1 stent; replaced stent; not sure when I got the last stent" (04/23/2017)  . DOPPLER ECHOCARDIOGRAPHY  2009  . ELECTROPHYSIOLOGIC STUDY N/A 03/31/2016   Procedure: Cardioversion;  Surgeon: Evans Lance, MD;  Location: [place] CV LAB;  Service: Cardiovascular;  Laterality: N/A;  . ELECTROPHYSIOLOGIC STUDY N/A 08/04/2016   Procedure: Cardioversion;  Surgeon: Evans Lance, MD;  Location: [place] CV LAB;  Service: Cardiovascular;  Laterality: N/A;  . INSERT / REPLACE / REMOVE PACEMAKER  06/2007  . IR RADIOLOGY PERIPHERAL GUIDED IV START  01/31/2018  . IR US GUIDE VASC ACCESS LEFT  01/31/2018  . JOINT REPLACEMENT    . LAPAROSCOPIC CHOLECYSTECTOMY  1994  . LEFT AND RIGHT HEART CATHETERIZATION WITH CORONARY ANGIOGRAM N/A 10/06/2013   Procedure: LEFT AND RIGHT HEART CATHETERIZATION WITH CORONARY ANGIOGRAM;  Surgeon: Troy Sine, MD;  Location: [facility] CATH LAB;  Service: Cardiovascular;  Laterality: N/A;  . LEFT OOPHORECTOMY Left ~ 1989  . POSTERIOR LUMBAR FUSION  2000s - 04/2015 X 3   L3-4; L4-5; L2-3; Dr Trenton Gammon  .  RIGHT/LEFT HEART CATH AND CORONARY ANGIOGRAPHY N/A 05/09/2017   Procedure: RIGHT/LEFT HEART CATH AND CORONARY ANGIOGRAPHY;  Surgeon: Sherren Mocha, MD;  Location: [place] CV LAB;  Service: Cardiovascular;  Laterality: N/A;  . SKIN CANCER EXCISION Right    [place]  . TEE WITHOUT CARDIOVERSION N/A 09/29/2016   Procedure: TRANSESOPHAGEAL ECHOCARDIOGRAM (TEE);  Surgeon: Jerline Pain, MD;  Location: [place];  Service: Cardiovascular;  Laterality: N/A;  . TOTAL ABDOMINAL HYSTERECTOMY  1984   "uterus & right ovary"  . TOTAL KNEE ARTHROPLASTY Left 04/23/2017  . TOTAL KNEE ARTHROPLASTY Left 04/23/2017   Procedure: TOTAL KNEE ARTHROPLASTY;  Surgeon: Frederik Pear, MD;  Location: [place];  Service: Orthopedics;  Laterality: Left;  . ULTRASOUND GUIDANCE FOR VASCULAR ACCESS  05/09/2017   Procedure: Ultrasound Guidance For Vascular Access;  Surgeon: Sherren Mocha, MD;  Location: [place] CV LAB;  Service: Cardiovascular;;     Current Outpatient Medications  Medication Sig Dispense Refill  . Ascorbic Acid (VITAMIN C) 1000 MG tablet Take 1,000 mg by mouth daily.     [name] atorvastatin (  LIPITOR) 40 MG tablet TAKE 1 TABLET BY MOUTH EVERY DAY (Patient taking differently: Take 40 mg by mouth daily. ) 90 tablet 3  . Calcium Carbonate-Vitamin D (CALCIUM 600+D PO) Take 1 tablet by mouth daily.     . Cholecalciferol (VITAMIN D3) 125 MCG (5000 UT) TABS Take 5,000 Units by mouth daily.     . Cyanocobalamin (B-12) 5000 MCG CAPS Take 5,000 mcg by mouth daily.    [name] diltiazem (CARDIZEM CD) 240 MG 24 hr capsule Take 1 capsule (240 mg total) by mouth daily. 90 capsule 2  . dofetilide (TIKOSYN) 250 MCG capsule Take 1 capsule (250 mcg total) by mouth 2 (two) times daily. 180 capsule 2  . DULoxetine (CYMBALTA) 60 MG capsule Take 1 capsule (60 mg total) by mouth daily. 90 capsule 0  . ELIQUIS 5 MG TABS tablet TAKE 1 TABLET BY MOUTH TWICE A DAY (Patient taking differently: Take 5 mg by mouth 2 (two) times daily. ) 180  tablet 1  . fluticasone (FLONASE) 50 MCG/ACT nasal spray Place 1 spray into both nostrils daily as needed for allergies.     [name] KLOR-CON M20 20 MEQ tablet TAKE 1 TABLET BY MOUTH TWICE A DAY (Patient taking differently: Take 20 mEq by mouth 2 (two) times daily. ) 60 tablet 3  . loratadine (CLARITIN) 10 MG tablet TAKE 1 TABLET BY MOUTH EVERY DAY (Patient taking differently: Take 10 mg by mouth daily. ) 90 tablet 1  . Lysine 1000 MG TABS Take 1,000 mg by mouth daily.     . Multiple Vitamin (MULTIVITAMIN WITH MINERALS) TABS tablet Take 1 tablet by mouth daily. Centrum    . nitroGLYCERIN (NITROSTAT) 0.4 MG SL tablet PLACE 1 TABLET (0.4 MG TOTAL) UNDER THE TONGUE EVERY 5 (FIVE) MINUTES X 3 DOSES AS NEEDED FOR CHEST PAIN. 75 tablet 1  . nystatin-triamcinolone (MYCOLOG II) cream Apply 1 application topically 2 (two) times daily as needed. 30 g 1  . omeprazole (PRILOSEC) 20 MG capsule Take 20 mg by mouth daily before breakfast.     . Polyethyl Glycol-Propyl Glycol (SYSTANE) 0.4-0.3 % SOLN Place 1 drop into both eyes 2 (two) times daily.     [name] torsemide (DEMADEX) 20 MG tablet Take 2 tablets (40 mg total) by mouth 2 (two) times daily. 360 tablet 3  . valACYclovir (VALTREX) 1000 MG tablet Take 2 tablets (2,000 mg total) by mouth every 12 (twelve) hours as needed (fever blisters/cold sores.). 30 tablet 1  . vitamin E 1000 UNIT capsule Take 1,000 Units by mouth daily.      No current facility-administered medications for this visit.    Allergies:   Adhesive [tape]   Social History:  The patient  reports that she quit smoking about 37 years ago. Her smoking use included cigarettes. She started smoking about 41 years ago. She has a 5.00 pack-year smoking history. She has never used smokeless tobacco. She reports current alcohol use. She reports that she does not use drugs.   Family History:  The patient's family history includes Arrhythmia in her mother; Colon cancer in her maternal aunt; Dementia in her mother;  Diabetes in her mother and paternal grandfather; Heart attack in her brother; Heart disease in her paternal aunt; Hypertension in her mother; Prostate cancer in her maternal grandfather; Suicidality in her father.   ROS:  Please see the history of present illness.   Otherwise, review of systems is positive for none.   All other systems are reviewed and negative.  PHYSICAL EXAM: VS:  BP 128/84   Pulse (!) 134   Ht 5' 6.5" (1.689 m)   Wt 271 lb (122.9 kg)   SpO2 97%   BMI 43.09 kg/m  , BMI Body mass index is 43.09 kg/m. GEN: Well nourished, well developed, in no acute distress  HEENT: normal  Neck: no JVD, carotid bruits, or masses Cardiac: Irregular, tachycardic; no murmurs, rubs, or gallops,no edema  Respiratory:  clear to auscultation bilaterally, normal work of breathing GI: soft, nontender, nondistended, + BS MS: no deformity or atrophy  Skin: warm and dry, device site well healed Neuro:  Strength and sensation are intact Psych: euthymic mood, full affect  EKG:  EKG is ordered today. Personal review of the ekg ordered shows atrial flutter, rate 134  Personal review of the device interrogation today. Results in [place]: 01/16/2020: Platelets 236 01/22/2020: Hemoglobin 15.3 01/28/2020: ALT 28; TSH 2.77 02/27/2020: BUN 14; Creatinine, Ser 0.94; Magnesium 2.2; Potassium 4.3; Sodium 139    Lipid Panel     Component Value Date/Time   CHOL 151 04/10/2019 0801   CHOL 116 10/16/2016 0850   TRIG 160.0 (H) 04/10/2019 0801   HDL 50.80 04/10/2019 0801   HDL 42 10/16/2016 0850   CHOLHDL 3 04/10/2019 0801   VLDL 32.0 04/10/2019 0801   LDLCALC 68 04/10/2019 0801   LDLCALC 46 10/16/2016 0850     Wt Readings from Last 3 Encounters:  03/30/20 271 lb (122.9 kg)  02/27/20 268 lb 6.4 oz (121.7 kg)  02/19/20 268 lb 4.8 oz (121.7 kg)      Other studies Reviewed: Additional studies/ records that were reviewed today include: TTE 03/28/16  Review of the above records  today demonstrates:  - Left ventricle: The cavity size was normal. Wall thickness was   increased in a pattern of mild LVH. Systolic function was normal.   The estimated ejection fraction was in the range of 55% to 60%.   Indeterminant diastolic function (atrial fibrillation). Wall   motion was normal; there were no regional wall motion   abnormalities. - Aortic valve: There was no stenosis. - Mitral valve: Mildly calcified annulus. There was no significant   regurgitation. - Left atrium: The atrium was mildly dilated. - Right ventricle: The cavity size was normal. Pacer wire or   catheter noted in right ventricle. Systolic function was normal. - Tricuspid valve: Peak RV-RA gradient (S): 22 mm Hg. - Pulmonary arteries: PA peak pressure: 30 mm Hg (S). - Systemic veins: IVC measured 2.3 cm with normal respirophasic   variation, suggesting RA pressure 8 mmHg.  LHC/RHC 05/09/17 1.  Single-vessel coronary artery disease with continued patency of the stented segment in the right coronary artery and chronic occlusion of a small acute marginal branch collateralized by the left coronary artery 2.  Patent left main, LAD, and left circumflex without significant stenosis 3.  Vigorous LV systolic function with mildly elevated LVEDP 4.  Severe pulmonary hypertension with mean pulmonary artery pressure of 48 mmHg.  Because of high cardiac output (8.5 L/min), the patient's pulmonary vascular resistance is only mildly increased at 3 Wood units.  ASSESSMENT AND PLAN:  1.  Persistent atrial fibrillation/flutter: Status post AF ablation x3, most recently 03/14/2019.  Currently on Eliquis with a CHA2DS2-VASc of 3.  Has been started on Tikosyn (high risk medication monitoring).  Unfortunately she has gone into atrial flutter.  She feels well when her heart rate is well controlled.  I do think that the  decrease in his organizing potentially atrial fibrillation into atrial flutter.  Jahred Tatar attempt rate control with  increased dose of diltiazem.  I Jamari Diana see her back in 6 months for further discussions of therapy if necessary  2.  Hypertension: Currently well controlled  3.  Obesity: Diet and weight loss encouraged  4.  Chronic diastolic heart failure: No signs of volume overload  5.  Obstructive sleep apnea: CPAP compliance encouraged  6.  Sinus node dysfunction: Status post Saint Jude dual-chamber pacemaker.  Device.  No changes.    Current medicines are reviewed at length with the patient today.   The patient does not have concerns regarding her medicines.  The following changes were made today: Increase diltiazem  Labs/ tests ordered today include:  Orders Placed This Encounter  Procedures  . EKG 12-Lead     Disposition:   FU with Johnathyn Viscomi 1.5 months  Signed, Avner Stroder Meredith Leeds, MD  03/30/2020 3:35 PM     [facility] [address] [place] [address] [phone] (office) [phone] (fax)

## 2020-04-05 DIAGNOSIS — D492 Neoplasm of unspecified behavior of bone, soft tissue, and skin: Secondary | ICD-10-CM | POA: Diagnosis not present

## 2020-04-05 DIAGNOSIS — H04123 Dry eye syndrome of bilateral lacrimal glands: Secondary | ICD-10-CM | POA: Diagnosis not present

## 2020-04-05 DIAGNOSIS — H02831 Dermatochalasis of right upper eyelid: Secondary | ICD-10-CM | POA: Diagnosis not present

## 2020-04-05 DIAGNOSIS — Z961 Presence of intraocular lens: Secondary | ICD-10-CM | POA: Diagnosis not present

## 2020-04-05 DIAGNOSIS — H43813 Vitreous degeneration, bilateral: Secondary | ICD-10-CM | POA: Diagnosis not present

## 2020-04-05 DIAGNOSIS — H10413 Chronic giant papillary conjunctivitis, bilateral: Secondary | ICD-10-CM | POA: Diagnosis not present

## 2020-04-05 DIAGNOSIS — H02834 Dermatochalasis of left upper eyelid: Secondary | ICD-10-CM | POA: Diagnosis not present

## 2020-04-05 DIAGNOSIS — H26491 Other secondary cataract, right eye: Secondary | ICD-10-CM | POA: Diagnosis not present

## 2020-04-05 DIAGNOSIS — H40053 Ocular hypertension, bilateral: Secondary | ICD-10-CM | POA: Diagnosis not present

## 2020-04-07 ENCOUNTER — Other Ambulatory Visit: Payer: Self-pay | Admitting: Internal Medicine

## 2020-04-26 ENCOUNTER — Other Ambulatory Visit: Payer: Self-pay | Admitting: Internal Medicine

## 2020-04-26 ENCOUNTER — Other Ambulatory Visit: Payer: Self-pay | Admitting: Cardiology

## 2020-04-28 ENCOUNTER — Telehealth: Payer: Self-pay

## 2020-04-28 NOTE — Telephone Encounter (Signed)
Eliquis 5mg  refill request received. Patient is 74 years old, weight-122.9kg, Crea-0.94 on 02/27/2020, Diagnosis-Afflutter, and last seen by Dr. Curt Bears on 03/30/2020. Dose is appropriate based on dosing criteria. Will send in refill to requested pharmacy.

## 2020-04-28 NOTE — Telephone Encounter (Signed)
Faxed and confirmed physician order form to [facility] mammography

## 2020-04-29 ENCOUNTER — Encounter: Payer: Self-pay | Admitting: Internal Medicine

## 2020-04-29 DIAGNOSIS — N644 Mastodynia: Secondary | ICD-10-CM | POA: Diagnosis not present

## 2020-04-29 LAB — HM MAMMOGRAPHY: HM Mammogram: NORMAL (ref 0–4)

## 2020-05-03 ENCOUNTER — Other Ambulatory Visit: Payer: Self-pay | Admitting: Cardiovascular Disease

## 2020-05-11 ENCOUNTER — Other Ambulatory Visit: Payer: Self-pay

## 2020-05-11 ENCOUNTER — Encounter: Payer: Self-pay | Admitting: Cardiology

## 2020-05-11 ENCOUNTER — Ambulatory Visit: Payer: PPO | Admitting: Cardiology

## 2020-05-11 VITALS — BP 112/58 | HR 81 | Ht 66.5 in | Wt 268.0 lb

## 2020-05-11 DIAGNOSIS — I4819 Other persistent atrial fibrillation: Secondary | ICD-10-CM

## 2020-05-11 DIAGNOSIS — Z01812 Encounter for preprocedural laboratory examination: Secondary | ICD-10-CM

## 2020-05-11 MED ORDER — METOLAZONE 2.5 MG PO TABS: ORAL_TABLET | ORAL | 2 refills | Status: DC

## 2020-05-11 NOTE — Patient Instructions (Addendum)
Medication Instructions:  Your physician has recommended you make the following change in your medication:  1. START Metolazone 2.5 mg every 4 days as needed for swelling -- you can take this with your Torsemide  *If you need a refill on your cardiac medications before your next appointment, please call your pharmacy*   Lab Work: None ordered   Testing/Procedures: Your physician has recommended that you have a repeat ablation.  Please call the office if you would like to schedule this.    Follow-Up: At [facility], you and your health needs are our priority.  As part of our continuing mission to provide you with exceptional heart care, we have created designated Provider Care Teams.  These Care Teams include your primary Cardiologist (physician) and Advanced Practice Providers (APPs -  Physician Assistants and Nurse Practitioners) who all work together to provide you with the care you need, when you need it.  Your next appointment:   3 month(s)  The format for your next appointment:   In Person  Provider:   Allegra Lai, MD    Thank you for choosing [facility]!!   Trinidad Curet, RN [phone]   Other Instructions

## 2020-05-11 NOTE — Progress Notes (Signed)
Electrophysiology Office Note   Date:  05/12/2020   ID:  [name], DOB 09-29-1945, MRN [id number]  PCP:  [name], MD  Primary Electrophysiologist: Gaye Alken, MD    No chief complaint on file.    History of Present Illness: [name] is a 74 y.o. female who presents today for electrophysiology evaluation.     She has a history of coronary artery disease status post RCA stent, chronic diastolic heart failure, atrial fibrillation with tachybradycardia syndrome status post pacemaker, CKD, hypertension, hyperlipidemia, OSA on CPAP.  She has had ablation x3 for her atrial fibrillation.  She has since been loaded on dofetilide.   Today, denies symptoms of palpitations, chest pain, shortness of breath, orthopnea, PND, lower extremity edema, claudication, dizziness, presyncope, syncope, bleeding, or neurologic sequela. The patient is tolerating medications without difficulties.  Unfortunately she is in atrial flutter today.  She has some weakness and fatigue, but otherwise feels well.  She is comfortable if she is not going in and out of atrial flutter.  She states that she would be amenable to ablation but she would like to see after Thanksgiving whether or not that would be a good time.  Past Medical History:  Diagnosis Date  . Anticoagulant long-term use    pradaxa  . Anxiety   . Arthritis    "fingers, lower back" (04/23/2017   . CAD (coronary artery disease) 9379,0240   post PTCA with bare-metal stenting to mid RCA in December 2004     . CHF (congestive heart failure) ([place])   . Chronic atrial fibrillation ([place]) 06/2007   Tachybradycardia pacemaker  . Chronic kidney disease    10% function - ?R, other kidney is compensating    . CVA (cerebral vascular accident) [facility]) [id number]   denies residual on 04/23/2017  . Depression   . Diplopia 06/19/2008   Qualifier: Diagnosis of  By: [name] MD, [name]   . Dysrhythmia    ATRIAL FIBRILATION  . Edema of  lower extremity   . Hyperlipidemia   . Hypertension   . Inferior myocardial infarction [facility])    acute inferior wall mi/other medical hx  . Myocardial infarction ([place]) [id number]  . Obesity   . OSA on CPAP    last test- 2010  . Pacemaker   . Pneumonia 2014   tx. ----  [facility]  . Pulmonary hypertension ([place])    moderate pulmonary hypertension by 10/2016 echo and 10/2013 cardiac cath  . Shortness of breath   . Skin cancer    "cut off right [place]; burned off LLE" (04/23/2017)  . Sleep apnea   . Spondylolisthesis   . TIA (transient ischemic attack) 2008  . Unspecified hemorrhoids without mention of complication 2/42/6834   Colonoscopy--Dr. Carlean Purl    Past Surgical History:  Procedure Laterality Date  . ABDOMINAL HYSTERECTOMY    . APPENDECTOMY  1984  . ATRIAL FIBRILLATION ABLATION N/A 09/29/2016   Procedure: Atrial Fibrillation Ablation;  Surgeon: Jalyssa Fleisher Meredith Leeds, MD;  Location: [place] CV LAB;  Service: Cardiovascular;  Laterality: N/A;  . ATRIAL FIBRILLATION ABLATION N/A 02/07/2018   Procedure: ATRIAL FIBRILLATION ABLATION;  Surgeon: Constance Haw, MD;  Location: [place] CV LAB;  Service: Cardiovascular;  Laterality: N/A;  . ATRIAL FIBRILLATION ABLATION N/A 03/13/2019   Procedure: ATRIAL FIBRILLATION ABLATION;  Surgeon: Constance Haw, MD;  Location: [place] CV LAB;  Service: Cardiovascular;  Laterality: N/A;  . BACK SURGERY    . CARDIOVERSION  N/A 09/12/2017   Procedure: CARDIOVERSION;  Surgeon: Jerline Pain, MD;  Location: [facility] ENDOSCOPY;  Service: Cardiovascular;  Laterality: N/A;  . CARDIOVERSION N/A 12/13/2017   Procedure: CARDIOVERSION;  Surgeon: Sanda Klein, MD;  Location: [place] ENDOSCOPY;  Service: Cardiovascular;  Laterality: N/A;  . CARDIOVERSION N/A 02/18/2018   Procedure: CARDIOVERSION;  Surgeon: Dorothy Spark, MD;  Location: [facility] ENDOSCOPY;  Service: Cardiovascular;  Laterality: N/A;  . CARDIOVERSION N/A 05/20/2018   Procedure:  CARDIOVERSION;  Surgeon: [name], MD;  Location: [facility] ENDOSCOPY;  Service: Cardiovascular;  Laterality: N/A;  . CARDIOVERSION N/A 04/16/2019   Procedure: CARDIOVERSION;  Surgeon: Donato Heinz, MD;  Location: [place];  Service: Endoscopy;  Laterality: N/A;  . CARDIOVERSION N/A 01/22/2020   Procedure: CARDIOVERSION;  Surgeon: Geralynn Rile, MD;  Location: [place];  Service: Cardiovascular;  Laterality: N/A;  . CARDIOVERSION N/A 02/18/2020   Procedure: CARDIOVERSION;  Surgeon: Elouise Munroe, MD;  Location: [facility] ENDOSCOPY;  Service: Cardiovascular;  Laterality: N/A;  . CATARACT EXTRACTION W/ INTRAOCULAR LENS  IMPLANT, BILATERAL Bilateral 01/15/2017- 03/2017  . CHOLECYSTECTOMY    . CORONARY ANGIOPLASTY  X 2  . CORONARY ANGIOPLASTY WITH STENT PLACEMENT  1998; ~ 2007; ?date   "1 stent; replaced stent; not sure when I got the last stent" (04/23/2017)  . DOPPLER ECHOCARDIOGRAPHY  2009  . ELECTROPHYSIOLOGIC STUDY N/A 03/31/2016   Procedure: Cardioversion;  Surgeon: Evans Lance, MD;  Location: [place] CV LAB;  Service: Cardiovascular;  Laterality: N/A;  . ELECTROPHYSIOLOGIC STUDY N/A 08/04/2016   Procedure: Cardioversion;  Surgeon: Evans Lance, MD;  Location: [place] CV LAB;  Service: Cardiovascular;  Laterality: N/A;  . INSERT / REPLACE / REMOVE PACEMAKER  06/2007  . IR RADIOLOGY PERIPHERAL GUIDED IV START  01/31/2018  . IR US GUIDE VASC ACCESS LEFT  01/31/2018  . JOINT REPLACEMENT    . LAPAROSCOPIC CHOLECYSTECTOMY  1994  . LEFT AND RIGHT HEART CATHETERIZATION WITH CORONARY ANGIOGRAM N/A 10/06/2013   Procedure: LEFT AND RIGHT HEART CATHETERIZATION WITH CORONARY ANGIOGRAM;  Surgeon: Troy Sine, MD;  Location: [facility] CATH LAB;  Service: Cardiovascular;  Laterality: N/A;  . LEFT OOPHORECTOMY Left ~ 1989  . POSTERIOR LUMBAR FUSION  2000s - 04/2015 X 3   L3-4; L4-5; L2-3; Dr Trenton Gammon  . RIGHT/LEFT HEART CATH AND CORONARY ANGIOGRAPHY N/A 05/09/2017   Procedure:  RIGHT/LEFT HEART CATH AND CORONARY ANGIOGRAPHY;  Surgeon: Sherren Mocha, MD;  Location: [place] CV LAB;  Service: Cardiovascular;  Laterality: N/A;  . SKIN CANCER EXCISION Right    [place]  . TEE WITHOUT CARDIOVERSION N/A 09/29/2016   Procedure: TRANSESOPHAGEAL ECHOCARDIOGRAM (TEE);  Surgeon: Jerline Pain, MD;  Location: [place];  Service: Cardiovascular;  Laterality: N/A;  . TOTAL ABDOMINAL HYSTERECTOMY  1984   "uterus & right ovary"  . TOTAL KNEE ARTHROPLASTY Left 04/23/2017  . TOTAL KNEE ARTHROPLASTY Left 04/23/2017   Procedure: TOTAL KNEE ARTHROPLASTY;  Surgeon: Frederik Pear, MD;  Location: [place];  Service: Orthopedics;  Laterality: Left;  . ULTRASOUND GUIDANCE FOR VASCULAR ACCESS  05/09/2017   Procedure: Ultrasound Guidance For Vascular Access;  Surgeon: Sherren Mocha, MD;  Location: [place] CV LAB;  Service: Cardiovascular;;     Current Outpatient Medications  Medication Sig Dispense Refill  . Ascorbic Acid (VITAMIN C) 1000 MG tablet Take 1,000 mg by mouth daily.     [name] atorvastatin (LIPITOR) 40 MG tablet TAKE 1 TABLET BY MOUTH EVERY DAY (Patient taking differently: Take 40 mg by mouth  daily. ) 90 tablet 3  . Calcium Carbonate-Vitamin D (CALCIUM 600+D PO) Take 1 tablet by mouth daily.     . Cholecalciferol (VITAMIN D3) 125 MCG (5000 UT) TABS Take 5,000 Units by mouth daily.     . Cyanocobalamin (B-12) 5000 MCG CAPS Take 5,000 mcg by mouth daily.    [name] diltiazem (CARDIZEM LA) 360 MG 24 hr tablet Take 1 tablet (360 mg total) by mouth daily. 30 tablet 3  . dofetilide (TIKOSYN) 250 MCG capsule Take 1 capsule (250 mcg total) by mouth 2 (two) times daily. 180 capsule 2  . DULoxetine (CYMBALTA) 60 MG capsule TAKE 1 CAPSULE BY MOUTH EVERY DAY 90 capsule 1  . ELIQUIS 5 MG TABS tablet TAKE 1 TABLET BY MOUTH TWICE A DAY 180 tablet 2  . fluticasone (FLONASE) 50 MCG/ACT nasal spray Place 1 spray into both nostrils daily as needed for allergies.     [name] loratadine (CLARITIN) 10 MG tablet  TAKE 1 TABLET BY MOUTH EVERY DAY 90 tablet 1  . Lysine 1000 MG TABS Take 1,000 mg by mouth daily.     . Multiple Vitamin (MULTIVITAMIN WITH MINERALS) TABS tablet Take 1 tablet by mouth daily. Centrum    . nitroGLYCERIN (NITROSTAT) 0.4 MG SL tablet PLACE 1 TABLET (0.4 MG TOTAL) UNDER THE TONGUE EVERY 5 (FIVE) MINUTES X 3 DOSES AS NEEDED FOR CHEST PAIN. 75 tablet 1  . nystatin-triamcinolone (MYCOLOG II) cream Apply 1 application topically 2 (two) times daily as needed. 30 g 1  . omeprazole (PRILOSEC) 20 MG capsule Take 20 mg by mouth daily before breakfast.     . Polyethyl Glycol-Propyl Glycol (SYSTANE) 0.4-0.3 % SOLN Place 1 drop into both eyes 2 (two) times daily.     . potassium chloride SA (KLOR-CON M20) 20 MEQ tablet Take 1 tablet (20 mEq total) by mouth 2 (two) times daily. Please make yearly appt with Dr. Acie Fredrickson for January 2022 for future refills. Thank you 1st attempt 180 tablet 0  . torsemide (DEMADEX) 20 MG tablet Take 2 tablets (40 mg total) by mouth 2 (two) times daily. 360 tablet 3  . valACYclovir (VALTREX) 1000 MG tablet Take 2 tablets (2,000 mg total) by mouth every 12 (twelve) hours as needed (fever blisters/cold sores.). 30 tablet 1  . vitamin E 1000 UNIT capsule Take 1,000 Units by mouth daily.     . metolazone (ZAROXOLYN) 2.5 MG tablet Take 1 tablet (2.5 mg total) by mouth every 4 days AS NEEDED for swelling 7 tablet 2   No current facility-administered medications for this visit.    Allergies:   Adhesive [tape]   Social History:  The patient  reports that she quit smoking about 37 years ago. Her smoking use included cigarettes. She started smoking about 42 years ago. She has a 5.00 pack-year smoking history. She has never used smokeless tobacco. She reports current alcohol use. She reports that she does not use drugs.   Family History:  The patient's family history includes Arrhythmia in her mother; Colon cancer in her maternal aunt; Dementia in her mother; Diabetes in her  mother and paternal grandfather; Heart attack in her brother; Heart disease in her paternal aunt; Hypertension in her mother; Prostate cancer in her maternal grandfather; Suicidality in her father.   ROS:  Please see the history of present illness.   Otherwise, review of systems is positive for none.   All other systems are reviewed and negative.   PHYSICAL EXAM: VS:  BP (!) 112/58  Pulse 81   Ht 5' 6.5" (1.689 m)   Wt 268 lb (121.6 kg)   SpO2 94%   BMI 42.61 kg/m  , BMI Body mass index is 42.61 kg/m. GEN: Well nourished, well developed, in no acute distress  HEENT: normal  Neck: no JVD, carotid bruits, or masses Cardiac: RRR; no murmurs, rubs, or gallops, 2+ edema  Respiratory:  clear to auscultation bilaterally, normal work of breathing GI: soft, nontender, nondistended, + BS MS: no deformity or atrophy  Skin: warm and dry, device site well healed Neuro:  Strength and sensation are intact Psych: euthymic mood, full affect  EKG:  EKG is ordered today. Personal review of the ekg ordered shows atrial flutter, rate 81  Personal review of the device interrogation today. Results in [place]: 01/16/2020: Platelets 236 01/22/2020: Hemoglobin 15.3 01/28/2020: ALT 28; TSH 2.77 02/27/2020: [name] 14; Creatinine, Ser 0.94; Magnesium 2.2; Potassium 4.3; Sodium 139    Lipid Panel     Component Value Date/Time   CHOL 151 04/10/2019 0801   CHOL 116 10/16/2016 0850   TRIG 160.0 (H) 04/10/2019 0801   HDL 50.80 04/10/2019 0801   HDL 42 10/16/2016 0850   CHOLHDL 3 04/10/2019 0801   VLDL 32.0 04/10/2019 0801   LDLCALC 68 04/10/2019 0801   LDLCALC 46 10/16/2016 0850     Wt Readings from Last 3 Encounters:  05/11/20 268 lb (121.6 kg)  03/30/20 271 lb (122.9 kg)  02/27/20 268 lb 6.4 oz (121.7 kg)      Other studies Reviewed: Additional studies/ records that were reviewed today include: TTE 03/28/16  Review of the above records today demonstrates:  - Left ventricle: The  cavity size was normal. Wall thickness was   increased in a pattern of mild LVH. Systolic function was normal.   The estimated ejection fraction was in the range of 55% to 60%.   Indeterminant diastolic function (atrial fibrillation). Wall   motion was normal; there were no regional wall motion   abnormalities. - Aortic valve: There was no stenosis. - Mitral valve: Mildly calcified annulus. There was no significant   regurgitation. - Left atrium: The atrium was mildly dilated. - Right ventricle: The cavity size was normal. Pacer wire or   catheter noted in right ventricle. Systolic function was normal. - Tricuspid valve: Peak RV-RA gradient (S): 22 mm Hg. - Pulmonary arteries: PA peak pressure: 30 mm Hg (S). - Systemic veins: IVC measured 2.3 cm with normal respirophasic   variation, suggesting RA pressure 8 mmHg.  LHC/RHC 05/09/17 1.  Single-vessel coronary artery disease with continued patency of the stented segment in the right coronary artery and chronic occlusion of a small acute marginal branch collateralized by the left coronary artery 2.  Patent left main, LAD, and left circumflex without significant stenosis 3.  Vigorous LV systolic function with mildly elevated LVEDP 4.  Severe pulmonary hypertension with mean pulmonary artery pressure of 48 mmHg.  Because of high cardiac output (8.5 L/min), the patient's pulmonary vascular resistance is only mildly increased at 3 [place] units.  ASSESSMENT AND PLAN:  1.  Persistent atrial fibrillation/flutter: Status post ablation x3, most recently March 14, 2019.  Currently on Eliquis with a CHA2DS2-VASc of 3.  Has recently started dofetilide (monitoring for high risk medication).  She is unfortunately in atrial flutter today and has been over the last 3 or so weeks.  She has some shortness of breath and fatigue, but otherwise feels okay.  I did offer  her repeat ablation.  She [place] think about this and let us know.  2.  Hypertension: Currently  well controlled  3.  Obesity: Diet and exercise encouraged  4.  Chronic diastolic heart failure: 2+ lower extremity edema today.  We Myrlene Riera give her metolazone to take along with her torsemide.  She can take her metolazone as needed.  5.  Obstructive sleep apnea: CPAP compliance encouraged  6.  Sinus node dysfunction: Status post Saint Jude dual-chamber pacemaker.  Device functioning appropriately.  No changes.    Current medicines are reviewed at length with the patient today.   The patient does not have concerns regarding her medicines.  The following changes were made today: Metolazone  Labs/ tests ordered today include:  Orders Placed This Encounter  Procedures  . EKG 12-Lead     Disposition:   FU with Dillen Belmontes 3 months  Signed, Amantha Sklar Meredith Leeds, MD  05/12/2020 11:56 AM     [facility] [address] [place] [address] [phone] (office) [phone] (fax)

## 2020-05-12 ENCOUNTER — Telehealth: Payer: Self-pay | Admitting: Internal Medicine

## 2020-05-12 NOTE — Telephone Encounter (Signed)
Left message for patient to call back and schedule Medicare Annual Wellness Visit (AWV) either virtually or in office.  Last AWV 11/22/16; please schedule at anytime with [facility] Nurse Health Advisor 2.  This should be a 45 minute visit.

## 2020-05-15 ENCOUNTER — Other Ambulatory Visit: Payer: Self-pay | Admitting: Cardiovascular Disease

## 2020-05-17 ENCOUNTER — Telehealth (INDEPENDENT_AMBULATORY_CARE_PROVIDER_SITE_OTHER): Payer: PPO | Admitting: Internal Medicine

## 2020-05-17 ENCOUNTER — Encounter: Payer: Self-pay | Admitting: Internal Medicine

## 2020-05-17 VITALS — BP 121/79 | HR 104

## 2020-05-17 DIAGNOSIS — I480 Paroxysmal atrial fibrillation: Secondary | ICD-10-CM

## 2020-05-17 DIAGNOSIS — Z7901 Long term (current) use of anticoagulants: Secondary | ICD-10-CM | POA: Diagnosis not present

## 2020-05-17 DIAGNOSIS — J069 Acute upper respiratory infection, unspecified: Secondary | ICD-10-CM | POA: Diagnosis not present

## 2020-05-17 DIAGNOSIS — R070 Pain in throat: Secondary | ICD-10-CM | POA: Diagnosis not present

## 2020-05-17 DIAGNOSIS — I272 Pulmonary hypertension, unspecified: Secondary | ICD-10-CM

## 2020-05-17 MED ORDER — CEFDINIR 300 MG PO CAPS: 300.0000 mg | ORAL_CAPSULE | Freq: Two times a day (BID) | ORAL | 0 refills | Status: DC

## 2020-05-17 NOTE — Progress Notes (Signed)
Virtual Visit via Video Note  I connected with@ on 05/17/20 at 10:30 AM EST by a video enabled telemedicine application and verified that I am speaking with the correct person using two identifiers. Location patient: home Location provider:work  office Persons participating in the virtual visit: patient, provider  [place]th national recommendations  regarding COVID 19 pandemic   video visit is advised over in office visit for this patient.  Patient aware  of the limitations of evaluation and management by telemedicine and  availability of in person appointments. and agreed to proceed.   HPI: [name] presents for video visit for new acute illness. Onset 3 to 4 days ago of sore throat on the left side with left ear pain it is persistent and a white spot in the back of her throat.  Also a cough that she does not feel causes shortness of breath and no wheezing and is fairly dry. She does not feel more short of breath over baseline it is just a nuisance and is taken some Delsym. No fever she had a home Covid test negative has been immunized and has had a booster Her husband had a coughing illness without the sore throat Covid negative is getting better but still has a cough  Is now on Tikosyn to have a another ablation January 5.   She states her pulse ox is about 90 which is "pretty good for her". ROS: See pertinent positives and negatives per HPI.  Past Medical History:  Diagnosis Date  . Anticoagulant long-term use    pradaxa  . Anxiety   . Arthritis    "fingers, lower back" (04/23/2017   . CAD (coronary artery disease) 6010,9323   post PTCA with bare-metal stenting to mid RCA in December 2004     . CHF (congestive heart failure) ([place])   . Chronic atrial fibrillation ([place]) 06/2007   Tachybradycardia pacemaker  . Chronic kidney disease    10% function - ?R, other kidney is compensating    . CVA (cerebral vascular accident) [facility]) [id number]   denies residual on 04/23/2017  .  Depression   . Diplopia 06/19/2008   Qualifier: Diagnosis of  By: [name] MD, [name]   . Dysrhythmia    ATRIAL FIBRILATION  . Edema of lower extremity   . Hyperlipidemia   . Hypertension   . Inferior myocardial infarction [facility])    acute inferior wall mi/other medical hx  . Myocardial infarction ([place]) [id number]  . Obesity   . OSA on CPAP    last test- 2010  . Pacemaker   . Pneumonia 2014   tx. ----  [facility]  . Pulmonary hypertension ([place])    moderate pulmonary hypertension by 10/2016 echo and 10/2013 cardiac cath  . Shortness of breath   . Skin cancer    "cut off right Schreurs; burned off LLE" (04/23/2017)  . Sleep apnea   . Spondylolisthesis   . TIA (transient ischemic attack) 2008  . Unspecified hemorrhoids without mention of complication 5/42/7062   Colonoscopy--Dr. Carlean Purl     Past Surgical History:  Procedure Laterality Date  . ABDOMINAL HYSTERECTOMY    . APPENDECTOMY  1984  . ATRIAL FIBRILLATION ABLATION N/A 09/29/2016   Procedure: Atrial Fibrillation Ablation;  Surgeon: Will Meredith Leeds, MD;  Location: [place] CV LAB;  Service: Cardiovascular;  Laterality: N/A;  . ATRIAL FIBRILLATION ABLATION N/A 02/07/2018   Procedure: ATRIAL FIBRILLATION ABLATION;  Surgeon: Constance Haw, MD;  Location: [place] [facility];  Service:  Cardiovascular;  Laterality: N/A;  . ATRIAL FIBRILLATION ABLATION N/A 03/13/2019   Procedure: ATRIAL FIBRILLATION ABLATION;  Surgeon: Constance Haw, MD;  Location: [place] [facility];  Service: Cardiovascular;  Laterality: N/A;  . BACK SURGERY    . CARDIOVERSION N/A 09/12/2017   Procedure: CARDIOVERSION;  Surgeon: Jerline Pain, MD;  Location: [facility] ENDOSCOPY;  Service: Cardiovascular;  Laterality: N/A;  . CARDIOVERSION N/A 12/13/2017   Procedure: CARDIOVERSION;  Surgeon: Sanda Klein, MD;  Location: [place] ENDOSCOPY;  Service: Cardiovascular;  Laterality: N/A;  . CARDIOVERSION N/A 02/18/2018   Procedure: CARDIOVERSION;  Surgeon:  Dorothy Spark, MD;  Location: [facility] ENDOSCOPY;  Service: Cardiovascular;  Laterality: N/A;  . CARDIOVERSION N/A 05/20/2018   Procedure: CARDIOVERSION;  Surgeon: [name], MD;  Location: [facility] ENDOSCOPY;  Service: Cardiovascular;  Laterality: N/A;  . CARDIOVERSION N/A 04/16/2019   Procedure: CARDIOVERSION;  Surgeon: Donato Heinz, MD;  Location: [place];  Service: Endoscopy;  Laterality: N/A;  . CARDIOVERSION N/A 01/22/2020   Procedure: CARDIOVERSION;  Surgeon: Geralynn Rile, MD;  Location: [place];  Service: Cardiovascular;  Laterality: N/A;  . CARDIOVERSION N/A 02/18/2020   Procedure: CARDIOVERSION;  Surgeon: Elouise Munroe, MD;  Location: [facility] ENDOSCOPY;  Service: Cardiovascular;  Laterality: N/A;  . CATARACT EXTRACTION W/ INTRAOCULAR LENS  IMPLANT, BILATERAL Bilateral 01/15/2017- 03/2017  . CHOLECYSTECTOMY    . CORONARY ANGIOPLASTY  X 2  . CORONARY ANGIOPLASTY WITH STENT PLACEMENT  1998; ~ 2007; ?date   "1 stent; replaced stent; not sure when I got the last stent" (04/23/2017)  . DOPPLER ECHOCARDIOGRAPHY  2009  . ELECTROPHYSIOLOGIC STUDY N/A 03/31/2016   Procedure: Cardioversion;  Surgeon: Evans Lance, MD;  Location: [place] CV LAB;  Service: Cardiovascular;  Laterality: N/A;  . ELECTROPHYSIOLOGIC STUDY N/A 08/04/2016   Procedure: Cardioversion;  Surgeon: Evans Lance, MD;  Location: [place] [facility] LAB;  Service: Cardiovascular;  Laterality: N/A;  . INSERT / REPLACE / REMOVE PACEMAKER  06/2007  . IR RADIOLOGY PERIPHERAL GUIDED IV START  01/31/2018  . IR US GUIDE VASC ACCESS LEFT  01/31/2018  . JOINT REPLACEMENT    . LAPAROSCOPIC CHOLECYSTECTOMY  1994  . LEFT AND RIGHT HEART CATHETERIZATION WITH CORONARY ANGIOGRAM N/A 10/06/2013   Procedure: LEFT AND RIGHT HEART CATHETERIZATION WITH CORONARY ANGIOGRAM;  Surgeon: Troy Sine, MD;  Location: [facility] CATH LAB;  Service: Cardiovascular;  Laterality: N/A;  . LEFT OOPHORECTOMY Left ~ 1989  . POSTERIOR LUMBAR  FUSION  2000s - 04/2015 X 3   L3-4; L4-5; L2-3; Dr Trenton Gammon  . RIGHT/LEFT HEART CATH AND CORONARY ANGIOGRAPHY N/A 05/09/2017   Procedure: RIGHT/LEFT HEART CATH AND CORONARY ANGIOGRAPHY;  Surgeon: Sherren Mocha, MD;  Location: [place] [facility] LAB;  Service: Cardiovascular;  Laterality: N/A;  . SKIN CANCER EXCISION Right    [place]  . TEE WITHOUT CARDIOVERSION N/A 09/29/2016   Procedure: TRANSESOPHAGEAL ECHOCARDIOGRAM (TEE);  Surgeon: Jerline Pain, MD;  Location: [place];  Service: Cardiovascular;  Laterality: N/A;  . TOTAL ABDOMINAL HYSTERECTOMY  1984   "uterus & right ovary"  . TOTAL KNEE ARTHROPLASTY Left 04/23/2017  . TOTAL KNEE ARTHROPLASTY Left 04/23/2017   Procedure: TOTAL KNEE ARTHROPLASTY;  Surgeon: Frederik Pear, MD;  Location: [place];  Service: Orthopedics;  Laterality: Left;  . ULTRASOUND GUIDANCE FOR VASCULAR ACCESS  05/09/2017   Procedure: Ultrasound Guidance For Vascular Access;  Surgeon: Sherren Mocha, MD;  Location: [place] CV LAB;  Service: Cardiovascular;;    Family History  Problem Relation  Age of Onset  . Suicidality Father        suicide death pt was 3 yrs, 1  . Arrhythmia Mother   . Hypertension Mother   . Diabetes Mother   . Dementia Mother   . Heart attack Brother   . Heart disease Paternal Aunt   . Prostate cancer Maternal Grandfather   . Diabetes Paternal Grandfather        fathers side of the family  . Colon cancer Maternal Aunt     Social History   Tobacco Use  . Smoking status: Former Smoker    Packs/day: 1.00    Years: 5.00    Pack years: 5.00    Types: Cigarettes    Start date: 05/11/1978    Quit date: 07/03/1982    Years since quitting: 37.8  . Smokeless tobacco: Never Used  Vaping Use  . Vaping Use: Never used  Substance Use Topics  . Alcohol use: Yes    Comment: 04/23/2017 "once q 6 months; glass of wine"  . Drug use: No      Current Outpatient Medications:  .  Ascorbic Acid (VITAMIN C) 1000 MG tablet, Take 1,000 mg by mouth  daily. , Disp: , Rfl:  .  atorvastatin (LIPITOR) 40 MG tablet, TAKE 1 TABLET BY MOUTH EVERY DAY (Patient taking differently: Take 40 mg by mouth daily. ), Disp: 90 tablet, Rfl: 3 .  Calcium Carbonate-Vitamin D (CALCIUM 600+D PO), Take 1 tablet by mouth daily. , Disp: , Rfl:  .  Cholecalciferol (VITAMIN D3) 125 MCG (5000 UT) TABS, Take 5,000 Units by mouth daily. , Disp: , Rfl:  .  Cyanocobalamin (B-12) 5000 MCG CAPS, Take 5,000 mcg by mouth daily., Disp: , Rfl:  .  diltiazem (CARDIZEM LA) 360 MG 24 hr tablet, Take 1 tablet (360 mg total) by mouth daily., Disp: 30 tablet, Rfl: 3 .  dofetilide (TIKOSYN) 250 MCG capsule, Take 1 capsule (250 mcg total) by mouth 2 (two) times daily., Disp: 180 capsule, Rfl: 2 .  DULoxetine (CYMBALTA) 60 MG capsule, TAKE 1 CAPSULE BY MOUTH EVERY DAY, Disp: 90 capsule, Rfl: 1 .  ELIQUIS 5 MG TABS tablet, TAKE 1 TABLET BY MOUTH TWICE A DAY, Disp: 180 tablet, Rfl: 2 .  fluticasone (FLONASE) 50 MCG/ACT nasal spray, Place 1 spray into both nostrils daily as needed for allergies. , Disp: , Rfl:  .  loratadine (CLARITIN) 10 MG tablet, TAKE 1 TABLET BY MOUTH EVERY DAY, Disp: 90 tablet, Rfl: 1 .  Lysine 1000 MG TABS, Take 1,000 mg by mouth daily. , Disp: , Rfl:  .  metolazone (ZAROXOLYN) 2.5 MG tablet, Take 1 tablet (2.5 mg total) by mouth every 4 days AS NEEDED for swelling, Disp: 7 tablet, Rfl: 2 .  Multiple Vitamin (MULTIVITAMIN WITH MINERALS) TABS tablet, Take 1 tablet by mouth daily. Centrum, Disp: , Rfl:  .  nitroGLYCERIN (NITROSTAT) 0.4 MG SL tablet, PLACE 1 TABLET (0.4 MG TOTAL) UNDER THE TONGUE EVERY 5 (FIVE) MINUTES X 3 DOSES AS NEEDED FOR CHEST PAIN., Disp: 75 tablet, Rfl: 1 .  nystatin-triamcinolone (MYCOLOG II) cream, Apply 1 application topically 2 (two) times daily as needed., Disp: 30 g, Rfl: 1 .  omeprazole (PRILOSEC) 20 MG capsule, Take 20 mg by mouth daily before breakfast. , Disp: , Rfl:  .  Polyethyl Glycol-Propyl Glycol (SYSTANE) 0.4-0.3 % SOLN, Place 1 drop  into both eyes 2 (two) times daily. , Disp: , Rfl:  .  potassium chloride SA (KLOR-CON M20) 20 MEQ  tablet, Take 1 tablet (20 mEq total) by mouth 2 (two) times daily. Please make yearly appt with Dr. Acie Fredrickson for January 2022 for future refills. Thank you 1st attempt, Disp: 180 tablet, Rfl: 0 .  torsemide (DEMADEX) 20 MG tablet, Take 2 tablets (40 mg total) by mouth 2 (two) times daily., Disp: 360 tablet, Rfl: 3 .  valACYclovir (VALTREX) 1000 MG tablet, Take 2 tablets (2,000 mg total) by mouth every 12 (twelve) hours as needed (fever blisters/cold sores.)., Disp: 30 tablet, Rfl: 1 .  vitamin E 1000 UNIT capsule, Take 1,000 Units by mouth daily. , Disp: , Rfl:  .  cefdinir (OMNICEF) 300 MG capsule, Take 1 capsule (300 mg total) by mouth 2 (two) times daily., Disp: 14 capsule, Rfl: 0  EXAM: BP Readings from Last 3 Encounters:  05/17/20 121/79  05/11/20 (!) 112/58  03/30/20 128/84    VITALS per patient if applicable:  GENERAL: alert, oriented, appears well and in no acute distress her color is even has slight breathlessness but normal for baseline able to speak in complete sentences and no accessory muscles noted.  HEENT: atraumatic, conjunttiva clear, no obvious abnormalities on inspection of external nose and ears  NECK: normal movements of the head and neck tender on left side but no adenopathy. Throat visualization on the left there is a white spot tonsils +1. LUNGS: on inspection no signs of respiratory distress, breathing rate appears baseline for her no obvious , gasping or wheezing    PSYCH/NEURO: pleasant and cooperative, no obvious depression or anxiety, speech and thought processing grossly intact normal speech and cognition. Lab Results  Component Value Date   WBC 8.5 01/16/2020   HGB 15.3 (H) 01/22/2020   HCT 45.0 01/22/2020   PLT 236 01/16/2020   GLUCOSE 141 (H) 02/27/2020   CHOL 151 04/10/2019   TRIG 160.0 (H) 04/10/2019   HDL 50.80 04/10/2019   LDLCALC 68 04/10/2019    ALT 28 01/28/2020   AST 33 01/28/2020   NA 139 02/27/2020   K 4.3 02/27/2020   CL 99 02/27/2020   CREATININE 0.94 02/27/2020   BUN 14 02/27/2020   CO2 27 02/27/2020   TSH 2.77 01/28/2020   INR 1.3 (H) 02/17/2020   HGBA1C 6.8 (H) 01/28/2020   MICROALBUR <0.7 01/23/2017    ASSESSMENT AND PLAN:  Discussed the following assessment and plan:    ICD-10-CM   1. Throat pain left with exudate vs other   R07.0    with left ear referred pain   2. Upper respiratory infection with cough   J06.9   3. Pulmonary hypertension (HCC)  I27.20   4. Anticoagulant long-term use  Z79.01   5. Paroxysmal atrial fibrillation (HCC)  I48.0    Underlying high risk  cv pulm status  Overall seems viral but local pain  She reports that she does not feel short of breath or worse past her baseline just the pain in the cough. Expectant management empiric use of antibiotics avoiding drug interference Omnicef x7 days given Hot tea honey Delsym okay if does not interfere with medication as well as Mucinex and time. Low threshold to seek emergent care if any lower respiratory infection or decompensation. Her husband has been negative Covid tested and she with a home test and immunized x3. Low threshold to seek emergent care  Counseled.   Expectant management and discussion of plan and treatment with opportunity to ask questions and all were answered. The patient agreed with the plan and demonstrated an understanding  of the instructions.   Advised to call back or seek an in-person evaluation if worsening  or having  further concerns . No follow-ups on file.   [name], MD

## 2020-05-19 ENCOUNTER — Inpatient Hospital Stay (HOSPITAL_COMMUNITY)
Admission: EM | Admit: 2020-05-19 | Discharge: 2020-06-02 | DRG: 193 | Disposition: A | Discharge status: Home / Self Care | Payer: PPO | Attending: Internal Medicine | Admitting: Internal Medicine

## 2020-05-19 ENCOUNTER — Encounter (HOSPITAL_COMMUNITY): Payer: Self-pay | Admitting: Emergency Medicine

## 2020-05-19 ENCOUNTER — Emergency Department (HOSPITAL_COMMUNITY): Payer: PPO

## 2020-05-19 ENCOUNTER — Inpatient Hospital Stay (HOSPITAL_COMMUNITY): Payer: PPO

## 2020-05-19 DIAGNOSIS — I440 Atrioventricular block, first degree: Secondary | ICD-10-CM | POA: Diagnosis present

## 2020-05-19 DIAGNOSIS — J811 Chronic pulmonary edema: Secondary | ICD-10-CM | POA: Diagnosis not present

## 2020-05-19 DIAGNOSIS — E119 Type 2 diabetes mellitus without complications: Secondary | ICD-10-CM | POA: Diagnosis not present

## 2020-05-19 DIAGNOSIS — E1122 Type 2 diabetes mellitus with diabetic chronic kidney disease: Secondary | ICD-10-CM | POA: Diagnosis present

## 2020-05-19 DIAGNOSIS — I5033 Acute on chronic diastolic (congestive) heart failure: Secondary | ICD-10-CM | POA: Diagnosis present

## 2020-05-19 DIAGNOSIS — N182 Chronic kidney disease, stage 2 (mild): Secondary | ICD-10-CM | POA: Diagnosis present

## 2020-05-19 DIAGNOSIS — I272 Pulmonary hypertension, unspecified: Secondary | ICD-10-CM | POA: Diagnosis present

## 2020-05-19 DIAGNOSIS — I509 Heart failure, unspecified: Secondary | ICD-10-CM | POA: Diagnosis not present

## 2020-05-19 DIAGNOSIS — E785 Hyperlipidemia, unspecified: Secondary | ICD-10-CM | POA: Diagnosis not present

## 2020-05-19 DIAGNOSIS — Z8249 Family history of ischemic heart disease and other diseases of the circulatory system: Secondary | ICD-10-CM

## 2020-05-19 DIAGNOSIS — J121 Respiratory syncytial virus pneumonia: Principal | ICD-10-CM | POA: Diagnosis present

## 2020-05-19 DIAGNOSIS — E876 Hypokalemia: Secondary | ICD-10-CM | POA: Diagnosis present

## 2020-05-19 DIAGNOSIS — I4819 Other persistent atrial fibrillation: Secondary | ICD-10-CM | POA: Diagnosis present

## 2020-05-19 DIAGNOSIS — T502X5A Adverse effect of carbonic-anhydrase inhibitors, benzothiadiazides and other diuretics, initial encounter: Secondary | ICD-10-CM | POA: Diagnosis not present

## 2020-05-19 DIAGNOSIS — F32A Depression, unspecified: Secondary | ICD-10-CM | POA: Diagnosis not present

## 2020-05-19 DIAGNOSIS — I484 Atypical atrial flutter: Secondary | ICD-10-CM | POA: Diagnosis present

## 2020-05-19 DIAGNOSIS — Z6841 Body Mass Index (BMI) 40.0 and over, adult: Secondary | ICD-10-CM

## 2020-05-19 DIAGNOSIS — E873 Alkalosis: Secondary | ICD-10-CM | POA: Diagnosis present

## 2020-05-19 DIAGNOSIS — I252 Old myocardial infarction: Secondary | ICD-10-CM

## 2020-05-19 DIAGNOSIS — R410 Disorientation, unspecified: Secondary | ICD-10-CM | POA: Diagnosis not present

## 2020-05-19 DIAGNOSIS — R0902 Hypoxemia: Secondary | ICD-10-CM

## 2020-05-19 DIAGNOSIS — Z66 Do not resuscitate: Secondary | ICD-10-CM | POA: Diagnosis not present

## 2020-05-19 DIAGNOSIS — Z833 Family history of diabetes mellitus: Secondary | ICD-10-CM

## 2020-05-19 DIAGNOSIS — I480 Paroxysmal atrial fibrillation: Secondary | ICD-10-CM | POA: Diagnosis not present

## 2020-05-19 DIAGNOSIS — I130 Hypertensive heart and chronic kidney disease with heart failure and stage 1 through stage 4 chronic kidney disease, or unspecified chronic kidney disease: Secondary | ICD-10-CM | POA: Diagnosis not present

## 2020-05-19 DIAGNOSIS — E1165 Type 2 diabetes mellitus with hyperglycemia: Secondary | ICD-10-CM | POA: Diagnosis present

## 2020-05-19 DIAGNOSIS — T380X5A Adverse effect of glucocorticoids and synthetic analogues, initial encounter: Secondary | ICD-10-CM | POA: Diagnosis not present

## 2020-05-19 DIAGNOSIS — R0602 Shortness of breath: Secondary | ICD-10-CM | POA: Diagnosis not present

## 2020-05-19 DIAGNOSIS — I517 Cardiomegaly: Secondary | ICD-10-CM | POA: Diagnosis not present

## 2020-05-19 DIAGNOSIS — J9600 Acute respiratory failure, unspecified whether with hypoxia or hypercapnia: Secondary | ICD-10-CM | POA: Diagnosis present

## 2020-05-19 DIAGNOSIS — I9589 Other hypotension: Secondary | ICD-10-CM | POA: Diagnosis not present

## 2020-05-19 DIAGNOSIS — I2510 Atherosclerotic heart disease of native coronary artery without angina pectoris: Secondary | ICD-10-CM | POA: Diagnosis not present

## 2020-05-19 DIAGNOSIS — Z87891 Personal history of nicotine dependence: Secondary | ICD-10-CM

## 2020-05-19 DIAGNOSIS — R0600 Dyspnea, unspecified: Secondary | ICD-10-CM

## 2020-05-19 DIAGNOSIS — Z20822 Contact with and (suspected) exposure to covid-19: Secondary | ICD-10-CM | POA: Diagnosis not present

## 2020-05-19 DIAGNOSIS — Z955 Presence of coronary angioplasty implant and graft: Secondary | ICD-10-CM

## 2020-05-19 DIAGNOSIS — G4733 Obstructive sleep apnea (adult) (pediatric): Secondary | ICD-10-CM | POA: Diagnosis not present

## 2020-05-19 DIAGNOSIS — Z950 Presence of cardiac pacemaker: Secondary | ICD-10-CM

## 2020-05-19 DIAGNOSIS — F05 Delirium due to known physiological condition: Secondary | ICD-10-CM | POA: Diagnosis not present

## 2020-05-19 DIAGNOSIS — Z7901 Long term (current) use of anticoagulants: Secondary | ICD-10-CM

## 2020-05-19 DIAGNOSIS — E875 Hyperkalemia: Secondary | ICD-10-CM | POA: Diagnosis not present

## 2020-05-19 DIAGNOSIS — J205 Acute bronchitis due to respiratory syncytial virus: Secondary | ICD-10-CM | POA: Diagnosis present

## 2020-05-19 DIAGNOSIS — R7402 Elevation of levels of lactic acid dehydrogenase (LDH): Secondary | ICD-10-CM | POA: Diagnosis present

## 2020-05-19 DIAGNOSIS — I959 Hypotension, unspecified: Secondary | ICD-10-CM | POA: Diagnosis present

## 2020-05-19 DIAGNOSIS — J9601 Acute respiratory failure with hypoxia: Principal | ICD-10-CM | POA: Diagnosis present

## 2020-05-19 DIAGNOSIS — Z9049 Acquired absence of other specified parts of digestive tract: Secondary | ICD-10-CM | POA: Diagnosis not present

## 2020-05-19 DIAGNOSIS — Z79899 Other long term (current) drug therapy: Secondary | ICD-10-CM

## 2020-05-19 DIAGNOSIS — I700 Atherosclerosis of aorta: Secondary | ICD-10-CM | POA: Diagnosis not present

## 2020-05-19 DIAGNOSIS — Z96652 Presence of left artificial knee joint: Secondary | ICD-10-CM | POA: Diagnosis present

## 2020-05-19 DIAGNOSIS — Z8673 Personal history of transient ischemic attack (TIA), and cerebral infarction without residual deficits: Secondary | ICD-10-CM

## 2020-05-19 DIAGNOSIS — Z9861 Coronary angioplasty status: Secondary | ICD-10-CM

## 2020-05-19 DIAGNOSIS — I10 Essential (primary) hypertension: Secondary | ICD-10-CM | POA: Diagnosis present

## 2020-05-19 DIAGNOSIS — N179 Acute kidney failure, unspecified: Secondary | ICD-10-CM | POA: Diagnosis not present

## 2020-05-19 DIAGNOSIS — J9811 Atelectasis: Secondary | ICD-10-CM | POA: Diagnosis not present

## 2020-05-19 DIAGNOSIS — R4182 Altered mental status, unspecified: Secondary | ICD-10-CM | POA: Diagnosis not present

## 2020-05-19 DIAGNOSIS — I495 Sick sinus syndrome: Secondary | ICD-10-CM | POA: Diagnosis present

## 2020-05-19 DIAGNOSIS — E1169 Type 2 diabetes mellitus with other specified complication: Secondary | ICD-10-CM

## 2020-05-19 DIAGNOSIS — R059 Cough, unspecified: Secondary | ICD-10-CM | POA: Diagnosis not present

## 2020-05-19 DIAGNOSIS — J90 Pleural effusion, not elsewhere classified: Secondary | ICD-10-CM | POA: Diagnosis not present

## 2020-05-19 DIAGNOSIS — Q211 Atrial septal defect: Secondary | ICD-10-CM | POA: Diagnosis not present

## 2020-05-19 DIAGNOSIS — Z981 Arthrodesis status: Secondary | ICD-10-CM

## 2020-05-19 LAB — I-STAT ARTERIAL BLOOD GAS, ED
Acid-Base Excess: 10 mmol/L — ABNORMAL HIGH (ref 0.0–2.0)
Bicarbonate: 36.1 mmol/L — ABNORMAL HIGH (ref 20.0–28.0)
Calcium, Ion: 1.06 mmol/L — ABNORMAL LOW (ref 1.15–1.40)
HCT: 46 % (ref 36.0–46.0)
Hemoglobin: 15.6 g/dL — ABNORMAL HIGH (ref 12.0–15.0)
O2 Saturation: 90 %
Patient temperature: 99.9
Potassium: 2.9 mmol/L — ABNORMAL LOW (ref 3.5–5.1)
Sodium: 134 mmol/L — ABNORMAL LOW (ref 135–145)
TCO2: 38 mmol/L — ABNORMAL HIGH (ref 22–32)
pCO2 arterial: 51.6 mmHg — ABNORMAL HIGH (ref 32.0–48.0)
pH, Arterial: 7.456 — ABNORMAL HIGH (ref 7.350–7.450)
pO2, Arterial: 60 mmHg — ABNORMAL LOW (ref 83.0–108.0)

## 2020-05-19 LAB — BASIC METABOLIC PANEL
Anion gap: 17 — ABNORMAL HIGH (ref 5–15)
BUN: 27 mg/dL — ABNORMAL HIGH (ref 8–23)
CO2: 31 mmol/L (ref 22–32)
Calcium: 9.2 mg/dL (ref 8.9–10.3)
Chloride: 88 mmol/L — ABNORMAL LOW (ref 98–111)
Creatinine, Ser: 1.52 mg/dL — ABNORMAL HIGH (ref 0.44–1.00)
GFR, Estimated: 36 mL/min — ABNORMAL LOW (ref >60)
Glucose, Bld: 168 mg/dL — ABNORMAL HIGH (ref 70–99)
Potassium: 3.1 mmol/L — ABNORMAL LOW (ref 3.5–5.1)
Sodium: 136 mmol/L (ref 135–145)

## 2020-05-19 LAB — RESPIRATORY PANEL BY RT PCR (FLU A&B, COVID)
Influenza A by PCR: NEGATIVE
Influenza B by PCR: NEGATIVE
SARS Coronavirus 2 by RT PCR: NEGATIVE

## 2020-05-19 LAB — CBC
HCT: 45.8 % (ref 36.0–46.0)
Hemoglobin: 14.9 g/dL (ref 12.0–15.0)
MCH: 30.7 pg (ref 26.0–34.0)
MCHC: 32.5 g/dL (ref 30.0–36.0)
MCV: 94.4 fL (ref 80.0–100.0)
Platelets: 215 10*3/uL (ref 150–400)
RBC: 4.85 MIL/uL (ref 3.87–5.11)
RDW: 13.3 % (ref 11.5–15.5)
WBC: 8.2 10*3/uL (ref 4.0–10.5)
nRBC: 0 % (ref 0.0–0.2)

## 2020-05-19 LAB — HIV ANTIBODY (ROUTINE TESTING W REFLEX): HIV Screen 4th Generation wRfx: NONREACTIVE

## 2020-05-19 LAB — HEMOGLOBIN A1C
Hgb A1c MFr Bld: 8.1 % — ABNORMAL HIGH (ref 4.8–5.6)
Mean Plasma Glucose: 185.77 mg/dL

## 2020-05-19 LAB — BRAIN NATRIURETIC PEPTIDE: B Natriuretic Peptide: 51.8 pg/mL (ref 0.0–100.0)

## 2020-05-19 LAB — MAGNESIUM: Magnesium: 2.1 mg/dL (ref 1.7–2.4)

## 2020-05-19 LAB — TROPONIN I (HIGH SENSITIVITY): Troponin I (High Sensitivity): 20 ng/L — ABNORMAL HIGH (ref <18)

## 2020-05-19 MED ORDER — INSULIN ASPART 100 UNIT/ML ~~LOC~~ SOLN
0.0000 [IU] | Freq: Three times a day (TID) | SUBCUTANEOUS | Status: DC
  Administered 2020-05-20: 11 [IU] via SUBCUTANEOUS
  Administered 2020-05-20: 15 [IU] via SUBCUTANEOUS
  Administered 2020-05-20 – 2020-05-21 (×2): 4 [IU] via SUBCUTANEOUS
  Administered 2020-05-21: 7 [IU] via SUBCUTANEOUS
  Administered 2020-05-21: 3 [IU] via SUBCUTANEOUS
  Administered 2020-05-22: 7 [IU] via SUBCUTANEOUS
  Administered 2020-05-22: 4 [IU] via SUBCUTANEOUS
  Administered 2020-05-22: 3 [IU] via SUBCUTANEOUS
  Administered 2020-05-23: 7 [IU] via SUBCUTANEOUS
  Administered 2020-05-23 (×2): 4 [IU] via SUBCUTANEOUS
  Administered 2020-05-24: 7 [IU] via SUBCUTANEOUS
  Administered 2020-05-24 (×2): 3 [IU] via SUBCUTANEOUS
  Administered 2020-05-25: 4 [IU] via SUBCUTANEOUS
  Administered 2020-05-25: 3 [IU] via SUBCUTANEOUS
  Administered 2020-05-26: 7 [IU] via SUBCUTANEOUS
  Administered 2020-05-26 (×2): 4 [IU] via SUBCUTANEOUS
  Administered 2020-05-27 (×2): 3 [IU] via SUBCUTANEOUS
  Administered 2020-05-27 – 2020-05-28 (×2): 7 [IU] via SUBCUTANEOUS
  Administered 2020-05-28: 15 [IU] via SUBCUTANEOUS
  Administered 2020-05-28: 11 [IU] via SUBCUTANEOUS
  Administered 2020-05-29: 15 [IU] via SUBCUTANEOUS
  Administered 2020-05-29 (×2): 4 [IU] via SUBCUTANEOUS
  Administered 2020-05-30: 7 [IU] via SUBCUTANEOUS
  Administered 2020-05-30: 3 [IU] via SUBCUTANEOUS
  Administered 2020-05-30: 11 [IU] via SUBCUTANEOUS
  Administered 2020-05-31: 15 [IU] via SUBCUTANEOUS
  Administered 2020-05-31: 20 [IU] via SUBCUTANEOUS
  Administered 2020-05-31: 3 [IU] via SUBCUTANEOUS
  Administered 2020-06-01 (×2): 4 [IU] via SUBCUTANEOUS
  Administered 2020-06-01: 11 [IU] via SUBCUTANEOUS
  Administered 2020-06-02: 3 [IU] via SUBCUTANEOUS

## 2020-05-19 MED ORDER — CALCIUM CARBONATE-VITAMIN D 500-200 MG-UNIT PO TABS
1.0000 | ORAL_TABLET | Freq: Every day | ORAL | Status: DC
  Administered 2020-05-20 – 2020-06-02 (×14): 1 via ORAL
  Filled 2020-05-19 (×14): qty 1

## 2020-05-19 MED ORDER — SODIUM CHLORIDE 0.9 % IV SOLN
500.0000 mg | Freq: Once | INTRAVENOUS | Status: AC
  Administered 2020-05-19: 500 mg via INTRAVENOUS
  Filled 2020-05-19: qty 500

## 2020-05-19 MED ORDER — SODIUM CHLORIDE 0.9 % IV SOLN: 500.0000 mg | INTRAVENOUS | Status: DC

## 2020-05-19 MED ORDER — IPRATROPIUM-ALBUTEROL 0.5-2.5 (3) MG/3ML IN SOLN
3.0000 mL | Freq: Once | RESPIRATORY_TRACT | Status: AC
  Administered 2020-05-19: 3 mL via RESPIRATORY_TRACT
  Filled 2020-05-19: qty 3

## 2020-05-19 MED ORDER — VITAMIN E 45 MG (100 UNIT) PO CAPS
1000.0000 [IU] | ORAL_CAPSULE | Freq: Every day | ORAL | Status: DC
  Administered 2020-05-20: 1000 [IU] via ORAL
  Filled 2020-05-19 (×3): qty 10

## 2020-05-19 MED ORDER — POTASSIUM CHLORIDE CRYS ER 20 MEQ PO TBCR
40.0000 meq | EXTENDED_RELEASE_TABLET | Freq: Three times a day (TID) | ORAL | Status: AC
  Administered 2020-05-19 – 2020-05-20 (×3): 40 meq via ORAL
  Filled 2020-05-19 (×3): qty 2

## 2020-05-19 MED ORDER — SODIUM CHLORIDE 0.9% FLUSH
3.0000 mL | Freq: Two times a day (BID) | INTRAVENOUS | Status: DC
  Administered 2020-05-20 – 2020-06-02 (×28): 3 mL via INTRAVENOUS

## 2020-05-19 MED ORDER — ADULT MULTIVITAMIN W/MINERALS CH
1.0000 | ORAL_TABLET | Freq: Every day | ORAL | Status: DC
  Administered 2020-05-20 – 2020-06-02 (×14): 1 via ORAL
  Filled 2020-05-19 (×14): qty 1

## 2020-05-19 MED ORDER — ONDANSETRON HCL 4 MG/2ML IJ SOLN: 4.0000 mg | Freq: Four times a day (QID) | INTRAMUSCULAR | Status: DC | PRN

## 2020-05-19 MED ORDER — SODIUM CHLORIDE 0.9 % IV SOLN
1.0000 g | Freq: Once | INTRAVENOUS | Status: AC
  Administered 2020-05-19: 1 g via INTRAVENOUS
  Filled 2020-05-19: qty 10

## 2020-05-19 MED ORDER — DULOXETINE HCL 60 MG PO CPEP
60.0000 mg | ORAL_CAPSULE | Freq: Every day | ORAL | Status: DC
  Administered 2020-05-20 – 2020-06-02 (×14): 60 mg via ORAL
  Filled 2020-05-19 (×14): qty 1

## 2020-05-19 MED ORDER — ASCORBIC ACID 500 MG PO TABS
1000.0000 mg | ORAL_TABLET | Freq: Every day | ORAL | Status: DC
  Administered 2020-05-19 – 2020-06-01 (×14): 1000 mg via ORAL
  Filled 2020-05-19 (×15): qty 2

## 2020-05-19 MED ORDER — NITROGLYCERIN 0.4 MG SL SUBL: 0.4000 mg | SUBLINGUAL_TABLET | SUBLINGUAL | Status: DC | PRN

## 2020-05-19 MED ORDER — DILTIAZEM HCL-DEXTROSE 125-5 MG/125ML-% IV SOLN (PREMIX)
5.0000 mg/h | INTRAVENOUS | Status: DC
  Administered 2020-05-19: 5 mg/h via INTRAVENOUS
  Administered 2020-05-20: 10 mg/h via INTRAVENOUS
  Filled 2020-05-19 (×3): qty 125

## 2020-05-19 MED ORDER — SODIUM CHLORIDE 0.9 % IV SOLN
1.0000 g | INTRAVENOUS | Status: DC
  Administered 2020-05-20: 1 g via INTRAVENOUS
  Filled 2020-05-19: qty 10

## 2020-05-19 MED ORDER — BUDESONIDE 0.5 MG/2ML IN SUSP
2.0000 mg | Freq: Two times a day (BID) | RESPIRATORY_TRACT | Status: DC
  Administered 2020-05-19: 2 mg via RESPIRATORY_TRACT
  Administered 2020-05-20: 0.5 mg via RESPIRATORY_TRACT
  Administered 2020-05-20 – 2020-06-02 (×22): 2 mg via RESPIRATORY_TRACT
  Filled 2020-05-19 (×32): qty 8

## 2020-05-19 MED ORDER — FLUTICASONE PROPIONATE 50 MCG/ACT NA SUSP
1.0000 | Freq: Every evening | NASAL | Status: DC | PRN
  Administered 2020-05-19 – 2020-05-22 (×2): 1 via NASAL
  Filled 2020-05-19: qty 16

## 2020-05-19 MED ORDER — ALBUTEROL SULFATE (2.5 MG/3ML) 0.083% IN NEBU
2.5000 mg | INHALATION_SOLUTION | RESPIRATORY_TRACT | Status: DC | PRN
  Filled 2020-05-19: qty 3

## 2020-05-19 MED ORDER — METHYLPREDNISOLONE SODIUM SUCC 125 MG IJ SOLR
125.0000 mg | Freq: Once | INTRAMUSCULAR | Status: AC
  Administered 2020-05-19: 125 mg via INTRAVENOUS
  Filled 2020-05-19: qty 2

## 2020-05-19 MED ORDER — BENZONATATE 100 MG PO CAPS
200.0000 mg | ORAL_CAPSULE | Freq: Two times a day (BID) | ORAL | Status: DC
  Administered 2020-05-19 – 2020-05-20 (×3): 200 mg via ORAL
  Filled 2020-05-19 (×3): qty 2

## 2020-05-19 MED ORDER — SODIUM CHLORIDE 0.9 % IV SOLN: 250.0000 mL | INTRAVENOUS | Status: DC | PRN

## 2020-05-19 MED ORDER — ACETAMINOPHEN 325 MG PO TABS: 650.0000 mg | ORAL_TABLET | ORAL | Status: DC | PRN

## 2020-05-19 MED ORDER — VITAMIN B-12 1000 MCG PO TABS
5000.0000 ug | ORAL_TABLET | Freq: Every day | ORAL | Status: DC
  Administered 2020-05-19 – 2020-06-01 (×12): 5000 ug via ORAL
  Filled 2020-05-19 (×13): qty 5

## 2020-05-19 MED ORDER — DILTIAZEM LOAD VIA INFUSION
15.0000 mg | Freq: Once | INTRAVENOUS | Status: AC
  Administered 2020-05-19: 15 mg via INTRAVENOUS
  Filled 2020-05-19: qty 15

## 2020-05-19 MED ORDER — PANTOPRAZOLE SODIUM 40 MG PO TBEC
40.0000 mg | DELAYED_RELEASE_TABLET | Freq: Every day | ORAL | Status: DC
  Administered 2020-05-20 – 2020-06-02 (×14): 40 mg via ORAL
  Filled 2020-05-19 (×14): qty 1

## 2020-05-19 MED ORDER — DOFETILIDE 250 MCG PO CAPS
250.0000 ug | ORAL_CAPSULE | Freq: Two times a day (BID) | ORAL | Status: DC
  Administered 2020-05-19 – 2020-06-02 (×28): 250 ug via ORAL
  Filled 2020-05-19 (×30): qty 1

## 2020-05-19 MED ORDER — POLYVINYL ALCOHOL 1.4 % OP SOLN
1.0000 [drp] | Freq: Two times a day (BID) | OPHTHALMIC | Status: DC | PRN
  Administered 2020-05-19: 1 [drp] via OPHTHALMIC
  Filled 2020-05-19: qty 15

## 2020-05-19 MED ORDER — SODIUM CHLORIDE 0.9% FLUSH: 3.0000 mL | INTRAVENOUS | Status: DC | PRN

## 2020-05-19 MED ORDER — DILTIAZEM HCL ER COATED BEADS 360 MG PO CP24
360.0000 mg | ORAL_CAPSULE | Freq: Every day | ORAL | Status: DC
  Administered 2020-05-20 – 2020-06-02 (×14): 360 mg via ORAL
  Filled 2020-05-19 (×2): qty 2
  Filled 2020-05-19: qty 1
  Filled 2020-05-19: qty 2
  Filled 2020-05-19 (×3): qty 1
  Filled 2020-05-19 (×3): qty 2
  Filled 2020-05-19 (×2): qty 1
  Filled 2020-05-19 (×2): qty 2
  Filled 2020-05-19: qty 1
  Filled 2020-05-19: qty 2
  Filled 2020-05-19 (×2): qty 1
  Filled 2020-05-19: qty 2
  Filled 2020-05-19: qty 1
  Filled 2020-05-19: qty 2
  Filled 2020-05-19 (×4): qty 1
  Filled 2020-05-19 (×3): qty 2

## 2020-05-19 MED ORDER — POTASSIUM CHLORIDE CRYS ER 20 MEQ PO TBCR: 20.0000 meq | EXTENDED_RELEASE_TABLET | Freq: Two times a day (BID) | ORAL | Status: DC

## 2020-05-19 MED ORDER — SODIUM CHLORIDE 0.9 % IV SOLN
100.0000 mg | Freq: Two times a day (BID) | INTRAVENOUS | Status: DC
  Administered 2020-05-20: 100 mg via INTRAVENOUS
  Filled 2020-05-19 (×2): qty 100

## 2020-05-19 MED ORDER — IPRATROPIUM-ALBUTEROL 0.5-2.5 (3) MG/3ML IN SOLN
3.0000 mL | Freq: Four times a day (QID) | RESPIRATORY_TRACT | Status: DC
  Administered 2020-05-19 – 2020-05-20 (×4): 3 mL via RESPIRATORY_TRACT
  Filled 2020-05-19 (×4): qty 3

## 2020-05-19 MED ORDER — LORATADINE 10 MG PO TABS
10.0000 mg | ORAL_TABLET | Freq: Every day | ORAL | Status: DC
  Administered 2020-05-20 – 2020-06-02 (×14): 10 mg via ORAL
  Filled 2020-05-19 (×14): qty 1

## 2020-05-19 MED ORDER — TORSEMIDE 20 MG PO TABS
40.0000 mg | ORAL_TABLET | Freq: Two times a day (BID) | ORAL | Status: DC
  Administered 2020-05-19 – 2020-05-20 (×2): 40 mg via ORAL
  Filled 2020-05-19 (×3): qty 2

## 2020-05-19 MED ORDER — APIXABAN 5 MG PO TABS
5.0000 mg | ORAL_TABLET | Freq: Two times a day (BID) | ORAL | Status: DC
  Administered 2020-05-19 – 2020-06-02 (×28): 5 mg via ORAL
  Filled 2020-05-19 (×28): qty 1

## 2020-05-19 MED ORDER — ATORVASTATIN CALCIUM 40 MG PO TABS
40.0000 mg | ORAL_TABLET | Freq: Every day | ORAL | Status: DC
  Administered 2020-05-20 – 2020-06-02 (×14): 40 mg via ORAL
  Filled 2020-05-19 (×14): qty 1

## 2020-05-19 MED ORDER — FUROSEMIDE 10 MG/ML IJ SOLN
40.0000 mg | Freq: Two times a day (BID) | INTRAMUSCULAR | Status: DC
  Administered 2020-05-19: 40 mg via INTRAVENOUS
  Filled 2020-05-19: qty 4

## 2020-05-19 NOTE — ED Provider Notes (Signed)
[place] EMERGENCY DEPARTMENT Provider Note   CSN: [id number] Arrival date & time: 05/19/20  1046     History No chief complaint on file.   [name] is a 74 y.o. female.  Patient is a 74 year old female with extensive past medical history including congestive heart failure, coronary artery disease with stent, atrial fibrillation status post ablation, pulmonary hypertension, pacemaker.  She presents today for evaluation of shortness of breath.  This has been worsening over the past several days.  Patient describes congestion and nonproductive cough, but denies any fevers.  She denies chest pain.  She denies any swelling in her legs.  She reports being compliant with her diuretic and with her Eliquis.  Symptoms worse with exertion and change in position.  There are no alleviating factors.  The history is provided by the patient.       Past Medical History:  Diagnosis Date  . Anticoagulant long-term use    pradaxa  . Anxiety   . Arthritis    "fingers, lower back" (04/23/2017   . CAD (coronary artery disease) 5284,1324   post PTCA with bare-metal stenting to mid RCA in December 2004     . CHF (congestive heart failure) ([place])   . Chronic atrial fibrillation ([place]) 06/2007   Tachybradycardia pacemaker  . Chronic kidney disease    10% function - ?R, other kidney is compensating    . CVA (cerebral vascular accident) [facility]) 4010,2725   denies residual on 04/23/2017  . Depression   . Diplopia 06/19/2008   Qualifier: Diagnosis of  By: [name] MD, [name]   . Dysrhythmia    ATRIAL FIBRILATION  . Edema of lower extremity   . Hyperlipidemia   . Hypertension   . Inferior myocardial infarction [facility])    acute inferior wall mi/other medical hx  . Myocardial infarction ([place]) [id number]  . Obesity   . OSA on CPAP    last test- 2010  . Pacemaker   . Pneumonia 2014   tx. ----  [facility]  . Pulmonary hypertension ([place])    moderate pulmonary hypertension by  10/2016 echo and 10/2013 cardiac cath  . Shortness of breath   . Skin cancer    "cut off right [place]; burned off LLE" (04/23/2017)  . Sleep apnea   . Spondylolisthesis   . TIA (transient ischemic attack) 2008  . Unspecified hemorrhoids without mention of complication 3/66/4403   Colonoscopy--Dr. Carlean Purl     Patient Active Problem List   Diagnosis Date Noted  . Secondary hypercoagulable state ([place]) 02/16/2020  . CHF exacerbation ([place]) 05/31/2018  . Bronchitis 05/31/2018  . Primary osteoarthritis of left knee 04/23/2017  . Degenerative arthritis of left knee 04/19/2017  . Atypical atrial flutter ([place]) 09/30/2016  . Hematoma 09/30/2016  . Hypotension 09/30/2016  . Persistent atrial fibrillation ([place]) 09/29/2016  . Hyperlipidemia   . HLD (hyperlipidemia)   . Controlled type 2 diabetes mellitus without complication ([place])   . Acute respiratory failure with hypoxia ([place]) 03/26/2016  . DDD (degenerative disc disease), lumbar 04/05/2015  . Degenerative disc disease, lumbar 04/05/2015  . Severe obesity (BMI >= 40) ([place]) 02/16/2015  . Fatty liver disease, nonalcoholic 47/42/5956  . Cough, persistent 04/03/2014  . Acute on chronic diastolic congestive heart failure ([place]) 10/07/2013  . Pre-diabetes 09/24/2013  . Peripheral edema 08/05/2013  . Neoplasm of uncertain behavior of skin of back 11/09/2012  . Edema 11/04/2012  . Bleeding mole 11/04/2012  . Pulmonary hypertension ([place]) 06/11/2012  .  Pneumonia 06/05/2012  . Hypoxia 06/05/2012  . Anemia 06/05/2012  . Hemorrhoids 05/11/2012  . Dyspepsia 05/11/2012  . Medication management 04/20/2011  . Positional vertigo 10/07/2010  . Anticoagulant long-term use   . HERPES LABIALIS 12/30/2009  . INTERTRIGO 12/30/2009  . Obesity 06/02/2009  . Obstructive sleep apnea 10/12/2008  . HYPERLIPIDEMIA 09/15/2008  . SNORING 09/15/2008  . Essential hypertension 06/19/2008  . CAD S/P percutaneous coronary angioplasty 06/19/2008  . Paroxysmal atrial  fibrillation ([place]) 06/19/2008  . BRADYCARDIA-TACHYCARDIA SYNDROME 06/19/2008  . CEREBRAL ANEURYSM 06/19/2008  . Allergic rhinitis 06/19/2008  . HYPERGLYCEMIA 06/19/2008  . DEPRESSION, HX OF 06/19/2008  . CEREBROVASCULAR ACCIDENT, HX OF 06/19/2008  . PACEMAKER, PERMANENT 06/19/2008    Past Surgical History:  Procedure Laterality Date  . ABDOMINAL HYSTERECTOMY    . APPENDECTOMY  1984  . ATRIAL FIBRILLATION ABLATION N/A 09/29/2016   Procedure: Atrial Fibrillation Ablation;  Surgeon: Will Meredith Leeds, MD;  Location: [place] CV LAB;  Service: Cardiovascular;  Laterality: N/A;  . ATRIAL FIBRILLATION ABLATION N/A 02/07/2018   Procedure: ATRIAL FIBRILLATION ABLATION;  Surgeon: Constance Haw, MD;  Location: [place] CV LAB;  Service: Cardiovascular;  Laterality: N/A;  . ATRIAL FIBRILLATION ABLATION N/A 03/13/2019   Procedure: ATRIAL FIBRILLATION ABLATION;  Surgeon: Constance Haw, MD;  Location: [place] CV LAB;  Service: Cardiovascular;  Laterality: N/A;  . BACK SURGERY    . CARDIOVERSION N/A 09/12/2017   Procedure: CARDIOVERSION;  Surgeon: Jerline Pain, MD;  Location: [facility] ENDOSCOPY;  Service: Cardiovascular;  Laterality: N/A;  . CARDIOVERSION N/A 12/13/2017   Procedure: CARDIOVERSION;  Surgeon: Sanda Klein, MD;  Location: [place] ENDOSCOPY;  Service: Cardiovascular;  Laterality: N/A;  . CARDIOVERSION N/A 02/18/2018   Procedure: CARDIOVERSION;  Surgeon: Dorothy Spark, MD;  Location: [facility] ENDOSCOPY;  Service: Cardiovascular;  Laterality: N/A;  . CARDIOVERSION N/A 05/20/2018   Procedure: CARDIOVERSION;  Surgeon: [name], MD;  Location: [facility] ENDOSCOPY;  Service: Cardiovascular;  Laterality: N/A;  . CARDIOVERSION N/A 04/16/2019   Procedure: CARDIOVERSION;  Surgeon: Donato Heinz, MD;  Location: [place];  Service: Endoscopy;  Laterality: N/A;  . CARDIOVERSION N/A 01/22/2020   Procedure: CARDIOVERSION;  Surgeon: Geralynn Rile, MD;  Location: [place];  Service: Cardiovascular;  Laterality: N/A;  . CARDIOVERSION N/A 02/18/2020   Procedure: CARDIOVERSION;  Surgeon: Elouise Munroe, MD;  Location: [facility] ENDOSCOPY;  Service: Cardiovascular;  Laterality: N/A;  . CATARACT EXTRACTION W/ INTRAOCULAR LENS  IMPLANT, BILATERAL Bilateral 01/15/2017- 03/2017  . CHOLECYSTECTOMY    . CORONARY ANGIOPLASTY  X 2  . CORONARY ANGIOPLASTY WITH STENT PLACEMENT  1998; ~ 2007; ?date   "1 stent; replaced stent; not sure when I got the last stent" (04/23/2017)  . DOPPLER ECHOCARDIOGRAPHY  2009  . ELECTROPHYSIOLOGIC STUDY N/A 03/31/2016   Procedure: Cardioversion;  Surgeon: Evans Lance, MD;  Location: [place] CV LAB;  Service: Cardiovascular;  Laterality: N/A;  . ELECTROPHYSIOLOGIC STUDY N/A 08/04/2016   Procedure: Cardioversion;  Surgeon: Evans Lance, MD;  Location: [place] CV LAB;  Service: Cardiovascular;  Laterality: N/A;  . INSERT / REPLACE / REMOVE PACEMAKER  06/2007  . IR RADIOLOGY PERIPHERAL GUIDED IV START  01/31/2018  . IR US GUIDE VASC ACCESS LEFT  01/31/2018  . JOINT REPLACEMENT    . LAPAROSCOPIC CHOLECYSTECTOMY  1994  . LEFT AND RIGHT HEART CATHETERIZATION WITH CORONARY ANGIOGRAM N/A 10/06/2013   Procedure: LEFT AND RIGHT HEART CATHETERIZATION WITH CORONARY ANGIOGRAM;  Surgeon: Troy Sine, MD;  Location: [place] CATH LAB;  Service: Cardiovascular;  Laterality: N/A;  . LEFT OOPHORECTOMY Left ~ 1989  . POSTERIOR LUMBAR FUSION  2000s - 04/2015 X 3   L3-4; L4-5; L2-3; Dr Trenton Gammon  . RIGHT/LEFT HEART CATH AND CORONARY ANGIOGRAPHY N/A 05/09/2017   Procedure: RIGHT/LEFT HEART CATH AND CORONARY ANGIOGRAPHY;  Surgeon: Sherren Mocha, MD;  Location: [place] CV LAB;  Service: Cardiovascular;  Laterality: N/A;  . SKIN CANCER EXCISION Right    [place]  . TEE WITHOUT CARDIOVERSION N/A 09/29/2016   Procedure: TRANSESOPHAGEAL ECHOCARDIOGRAM (TEE);  Surgeon: Jerline Pain, MD;  Location: [facility];  Service: Cardiovascular;  Laterality: N/A;  . TOTAL  ABDOMINAL HYSTERECTOMY  1984   "uterus & right ovary"  . TOTAL KNEE ARTHROPLASTY Left 04/23/2017  . TOTAL KNEE ARTHROPLASTY Left 04/23/2017   Procedure: TOTAL KNEE ARTHROPLASTY;  Surgeon: Frederik Pear, MD;  Location: [facility];  Service: Orthopedics;  Laterality: Left;  . ULTRASOUND GUIDANCE FOR VASCULAR ACCESS  05/09/2017   Procedure: Ultrasound Guidance For Vascular Access;  Surgeon: Sherren Mocha, MD;  Location: [place] CV LAB;  Service: Cardiovascular;;     OB History   No obstetric history on file.     Family History  Problem Relation Age of Onset  . Suicidality Father        suicide death pt was 3 yrs, 18  . Arrhythmia Mother   . Hypertension Mother   . Diabetes Mother   . Dementia Mother   . Heart attack Brother   . Heart disease Paternal Aunt   . Prostate cancer Maternal Grandfather   . Diabetes Paternal Grandfather        fathers side of the family  . Colon cancer Maternal Aunt     Social History   Tobacco Use  . Smoking status: Former Smoker    Packs/day: 1.00    Years: 5.00    Pack years: 5.00    Types: Cigarettes    Start date: 05/11/1978    Quit date: 07/03/1982    Years since quitting: 37.9  . Smokeless tobacco: Never Used  Vaping Use  . Vaping Use: Never used  Substance Use Topics  . Alcohol use: Yes    Comment: 04/23/2017 "once q 6 months; glass of wine"  . Drug use: No    Home Medications Prior to Admission medications   Medication Sig Start Date End Date Taking? Authorizing Provider  Ascorbic Acid (VITAMIN C) 1000 MG tablet Take 1,000 mg by mouth daily.     [provider]  atorvastatin (LIPITOR) 40 MG tablet TAKE 1 TABLET BY MOUTH EVERY DAY Patient taking differently: Take 40 mg by mouth daily.  09/16/19   Camnitz, Ocie Doyne, MD  Calcium Carbonate-Vitamin D (CALCIUM 600+D PO) Take 1 tablet by mouth daily.     [provider]  cefdinir (OMNICEF) 300 MG capsule Take 1 capsule (300 mg total) by mouth 2 (two) times daily.  05/17/20   [name], [name], MD  Cholecalciferol (VITAMIN D3) 125 MCG (5000 UT) TABS Take 5,000 Units by mouth daily.     [provider]  Cyanocobalamin (B-12) 5000 MCG CAPS Take 5,000 mcg by mouth daily.    [provider]  diltiazem (CARDIZEM LA) 360 MG 24 hr tablet Take 1 tablet (360 mg total) by mouth daily. 03/30/20   Camnitz, Ocie Doyne, MD  dofetilide (TIKOSYN) 250 MCG capsule Take 1 capsule (250 mcg total) by mouth 2 (two) times daily. 03/01/20   Sherran Needs, NP  DULoxetine (CYMBALTA) 60 MG capsule TAKE 1 CAPSULE BY MOUTH EVERY DAY 04/26/20   [name], [name], MD  ELIQUIS 5 MG TABS tablet TAKE 1 TABLET BY MOUTH TWICE A DAY 04/28/20   Camnitz, Will Hassell Done, MD  fluticasone (FLONASE) 50 MCG/ACT nasal spray Place 1 spray into both nostrils daily as needed for allergies.     [provider]  loratadine (CLARITIN) 10 MG tablet TAKE 1 TABLET BY MOUTH EVERY DAY 04/07/20   [name], [name], MD  Lysine 1000 MG TABS Take 1,000 mg by mouth daily.     [provider]  metolazone (ZAROXOLYN) 2.5 MG tablet Take 1 tablet (2.5 mg total) by mouth every 4 days AS NEEDED for swelling 05/11/20   Camnitz, Ocie Doyne, MD  Multiple Vitamin (MULTIVITAMIN WITH MINERALS) TABS tablet Take 1 tablet by mouth daily. Centrum    [provider]  nitroGLYCERIN (NITROSTAT) 0.4 MG SL tablet PLACE 1 TABLET (0.4 MG TOTAL) UNDER THE TONGUE EVERY 5 (FIVE) MINUTES X 3 DOSES AS NEEDED FOR CHEST PAIN. 05/17/20   Nahser, Wonda Cheng, MD  nystatin-triamcinolone (MYCOLOG II) cream Apply 1 application topically 2 (two) times daily as needed. 01/28/20   [name], [name], MD  omeprazole (PRILOSEC) 20 MG capsule Take 20 mg by mouth daily before breakfast.     [provider]  Polyethyl Glycol-Propyl Glycol (SYSTANE) 0.4-0.3 % SOLN Place 1 drop into both eyes 2 (two) times daily.     [provider]  potassium chloride SA (KLOR-CON M20) 20 MEQ tablet Take 1 tablet (20 mEq total) by  mouth 2 (two) times daily. Please make yearly appt with Dr. Acie Fredrickson for January 2022 for future refills. Thank you 1st attempt 05/04/20   Nahser, Wonda Cheng, MD  torsemide (DEMADEX) 20 MG tablet Take 2 tablets (40 mg total) by mouth 2 (two) times daily. 07/11/19   Nahser, Wonda Cheng, MD  valACYclovir (VALTREX) 1000 MG tablet Take 2 tablets (2,000 mg total) by mouth every 12 (twelve) hours as needed (fever blisters/cold sores.). 10/17/19   [name], [name], MD  vitamin E 1000 UNIT capsule Take 1,000 Units by mouth daily.     [provider]    Allergies    Adhesive [tape]  Review of Systems   Review of Systems  All other systems reviewed and are negative.   Physical Exam Updated Vital Signs BP (!) 148/75 (BP Location: Right Arm)   Pulse 98   Temp 99.9 F (37.7 C) (Oral)   Resp 20   SpO2 (!) 84%   Physical Exam Vitals and nursing note reviewed.  Constitutional:      General: She is not in acute distress.    Appearance: She is well-developed. She is not diaphoretic.  HENT:     Head: Normocephalic and atraumatic.  Cardiovascular:     Rate and Rhythm: Normal rate and regular rhythm.     Heart sounds: No murmur heard.  No friction rub. No gallop.   Pulmonary:     Effort: Pulmonary effort is normal. No respiratory distress.     Breath sounds: Rhonchi present. No wheezing.     Comments: There are scattered rhonchi in the bases bilaterally. Abdominal:     General: Bowel sounds are normal. There is no distension.     Palpations: Abdomen is soft.     Tenderness: There is no abdominal tenderness.  Musculoskeletal:        General: Normal range of motion.     Cervical back: Normal  range of motion and neck supple.  Skin:    General: Skin is warm and dry.  Neurological:     Mental Status: She is alert and oriented to person, place, and time.     ED Results / Procedures / Treatments   Labs (all labs ordered are listed, but only abnormal results are displayed) Labs Reviewed    RESPIRATORY PANEL BY RT PCR (FLU A&B, COVID)  BASIC METABOLIC PANEL  CBC  BRAIN NATRIURETIC PEPTIDE  TROPONIN I (HIGH SENSITIVITY)    EKG EKG Interpretation  Date/Time:  Wednesday May 19 2020 11:04:07 EST Ventricular Rate:  98 PR Interval:    QRS Duration: 100 QT Interval:  482 QTC Calculation: 615 R Axis:   80 Text Interpretation: Sinus tachycardia First degree A-V block Nonspecific ST abnormality Prolonged QT Abnormal ECG Confirmed by Veryl Speak [phone]) on 05/19/2020 11:21:48 AM   Radiology DG Chest 2 View  Result Date: 05/19/2020 CLINICAL DATA:  Pt having SOB, cough and congestion since last Friday - hx of AFIB, CHF, pacemaker, CAD, sleep apnea, TIA, MI EXAM: CHEST - 2 VIEW COMPARISON:  05/31/2018 FINDINGS: Cardiac silhouette is normal in size. No mediastinal or hilar masses. No evidence of adenopathy. Stable left anterior chest wall sequential pacemaker. Mild chronic prominence of the interstitial and vascular markings. Lungs mildly hyperexpanded but otherwise clear. No pleural effusion or pneumothorax. Skeletal structures are intact. IMPRESSION: 1. No acute cardiopulmonary disease. 2. Chronic lung hyperexpansion with mild prominence of the interstitial and vascular markings, without pulmonary edema. Electronically Signed   By: Lajean Manes M.D.   On: 05/19/2020 11:35    Procedures Procedures (including critical care time)  Medications Ordered in ED Medications - No data to display  ED Course  I have reviewed the triage vital signs and the nursing notes.  Pertinent labs & imaging results that were available during my care of the patient were reviewed by me and considered in my medical decision making (see chart for details).    MDM Rules/Calculators/A&P  Patient with extensive past medical history as listed above presenting with complaints of URI symptoms for several days unrelieved with cefdinir prescribed by her primary doctor.  She became short of breath  over the past 2 days and presents for evaluation of this.  Patient arrives here hypoxic with oxygen saturations in the mid 80s on room air.  Patient placed on oxygen and work-up initiated.  Chest x-ray shows no acute abnormality and Covid test negative.  There was a delay in obtaining blood work secondary to the patient being a difficult stick for IV access.  An ABG was ultimately obtained showing hypoxia despite oxygen by 6 L nasal cannula.  BNP is mildly elevated, but the clinical picture is not consistent with CHF.  She does have some rhonchi on exam and was given steroids and albuterol with some relief.  Patient will require admission.  I have spoken with the hospitalist who was recommending a CT scan.  This study has been ordered and patient to be admitted for further care.  I did give Rocephin and Zithromax for presumed community acquired pneumonia.  CRITICAL CARE Performed by: Veryl Speak Total critical care time: 45 minutes Critical care time was exclusive of separately billable procedures and treating other patients. Critical care was necessary to treat or prevent imminent or life-threatening deterioration. Critical care was time spent personally by me on the following activities: development of treatment plan with patient and/or surrogate as well as nursing, discussions with consultants, evaluation  of patient's response to treatment, examination of patient, obtaining history from patient or surrogate, ordering and performing treatments and interventions, ordering and review of laboratory studies, ordering and review of radiographic studies, pulse oximetry and re-evaluation of patient's condition.   Final Clinical Impression(s) / ED Diagnoses Final diagnoses:  None    Rx / DC Orders ED Discharge Orders    None       Veryl Speak, MD 05/20/20 [phone]

## 2020-05-19 NOTE — ED Notes (Signed)
Attempted to gain IV access without success

## 2020-05-19 NOTE — Consult Note (Signed)
NAMEBRITT [name], MRN:  [id number], DOB:  [birth year], LOS: 0 ADMISSION DATE:  05/19/2020, CONSULTATION DATE:  05/19/2020  REFERRING MD:  TRH MD, CHIEF COMPLAINT:  Acute resp failure   Brief History   x  History of present illness - talking to patent and chart review   Background Pulm History  - Obese lady.  With coronary artery disease that is post PTCA, A. fib, type 2 diabetes and hypertension. .  Used to be followed by Dr Rachell Cipro - [phone] for OSA (AHI 21/h in 2010)  and CPAP for which back then good compliance was reported.  Then in May 2018 2018 she she had an admission for diastolic heart failure.  In the aftermath of that she continues to have dyspnea and exertional desaturation in the setting of amiodarone therapy and there was concern that she had interstitial lung disease and was seen in the pulmonary clinic.  However radiology felt the CT scan was most consistent with heart failure.  With subsequent diuresis at follow-up in October 2018 she was improved though there was still crackles and there is clinical concern for ILD.  She was referred for heart catheterization and underwent this in November 2018.  Her V waves are prominent.  Her mean pulmonary pressure was elevated at 48 mm per mercury and a high cardiac output of 8.5 L/min with only minimally elevated pulmonary vascular resistance of 3 [place] units.  Dr. Burt Knack recommended pulmonary vein isolation to rule out pulmonary vein stenosis.  In addition patient also had single-vessel coronary artery disease but with continued patency of the stented segment in the right coronary artery.  After that she was lost to pulmonary follow-up.  Review of the chart indicate atrial fibrillation has been a problem and an ablation has been planned by Dr. Curt Bears in January 2022.  She appears to no longer be on amiodarone.?  Stop date  She really presents now on May 19, 2020.  She tells at baseline she was not using oxygen at home.  She was  exposed to her husband was sick with nonproductive cough.  Then she developed pain in her left ear.  She took outpatient antibiotics but did not respond she got progressively short of breath and got admitted to the hospital.  He had Covid and flu test have been negative.  She has had a Covid vaccine.  She required facemask and nasal cannula oxygen.  Chest x-ray just shows hyperinflation.  She had a CT chest without contrast [not a high-resolution CT chest] shows groundglass opacities in the upper lobes but otherwise no presence of chronic ILD.  She is on chronic eliquids  Pulmonary has been consulted   Past Medical History   PFT Results Latest Ref Rng & Units 11/03/2016  FVC-Pre L 1.38  FVC-Predicted Pre % 41  FVC-Post L 1.55  FVC-Predicted Post % 47  Pre FEV1/FVC % % 81  Post FEV1/FCV % % 85  FEV1-Pre L 1.12  FEV1-Predicted Pre % 44  FEV1-Post L 1.32  DLCO uncorrected ml/min/mmHg 14.71  DLCO UNC% % 53  DLCO corrected ml/min/mmHg 14.06  DLCO COR %Predicted % 50  DLVA Predicted % 88  TLC L 3.76  TLC % Predicted % 69  RV % Predicted % 103      has a past medical history of Anticoagulant long-term use, Anxiety, Arthritis, CAD (coronary artery disease) (2094,7096), CHF (congestive heart failure) ([place]), Chronic atrial fibrillation ([place]) (06/2007), Chronic kidney disease, CVA (cerebral vascular accident) ([place]) (2836,6294), Depression,  Diplopia (06/19/2008), Dysrhythmia, Edema of lower extremity, Hyperlipidemia, Hypertension, Inferior myocardial infarction [facility]), Myocardial infarction ([place]) (2841,3244), Obesity, OSA on CPAP, Pacemaker, Pneumonia (2014), Pulmonary hypertension ([place]), Shortness of breath, Skin cancer, Sleep apnea, Spondylolisthesis, TIA (transient ischemic attack) (2008), and Unspecified hemorrhoids without mention of complication (0/04/2724).   reports that she quit smoking about 37 years ago. Her smoking use included cigarettes. She started smoking about 42 years ago. She has  a 5.00 pack-year smoking history. She has never used smokeless tobacco.  Past Surgical History:  Procedure Laterality Date  . ABDOMINAL HYSTERECTOMY    . APPENDECTOMY  1984  . ATRIAL FIBRILLATION ABLATION N/A 09/29/2016   Procedure: Atrial Fibrillation Ablation;  Surgeon: Will Meredith Leeds, MD;  Location: [place] CV LAB;  Service: Cardiovascular;  Laterality: N/A;  . ATRIAL FIBRILLATION ABLATION N/A 02/07/2018   Procedure: ATRIAL FIBRILLATION ABLATION;  Surgeon: Constance Haw, MD;  Location: [place] CV LAB;  Service: Cardiovascular;  Laterality: N/A;  . ATRIAL FIBRILLATION ABLATION N/A 03/13/2019   Procedure: ATRIAL FIBRILLATION ABLATION;  Surgeon: Constance Haw, MD;  Location: [place] CV LAB;  Service: Cardiovascular;  Laterality: N/A;  . BACK SURGERY    . CARDIOVERSION N/A 09/12/2017   Procedure: CARDIOVERSION;  Surgeon: Jerline Pain, MD;  Location: [facility] ENDOSCOPY;  Service: Cardiovascular;  Laterality: N/A;  . CARDIOVERSION N/A 12/13/2017   Procedure: CARDIOVERSION;  Surgeon: Sanda Klein, MD;  Location: [place] ENDOSCOPY;  Service: Cardiovascular;  Laterality: N/A;  . CARDIOVERSION N/A 02/18/2018   Procedure: CARDIOVERSION;  Surgeon: Dorothy Spark, MD;  Location: [facility] ENDOSCOPY;  Service: Cardiovascular;  Laterality: N/A;  . CARDIOVERSION N/A 05/20/2018   Procedure: CARDIOVERSION;  Surgeon: [name], MD;  Location: [facility] ENDOSCOPY;  Service: Cardiovascular;  Laterality: N/A;  . CARDIOVERSION N/A 04/16/2019   Procedure: CARDIOVERSION;  Surgeon: Donato Heinz, MD;  Location: [place];  Service: Endoscopy;  Laterality: N/A;  . CARDIOVERSION N/A 01/22/2020   Procedure: CARDIOVERSION;  Surgeon: Geralynn Rile, MD;  Location: [place];  Service: Cardiovascular;  Laterality: N/A;  . CARDIOVERSION N/A 02/18/2020   Procedure: CARDIOVERSION;  Surgeon: Elouise Munroe, MD;  Location: [facility] ENDOSCOPY;  Service: Cardiovascular;  Laterality: N/A;  .  CATARACT EXTRACTION W/ INTRAOCULAR LENS  IMPLANT, BILATERAL Bilateral 01/15/2017- 03/2017  . CHOLECYSTECTOMY    . CORONARY ANGIOPLASTY  X 2  . CORONARY ANGIOPLASTY WITH STENT PLACEMENT  1998; ~ 2007; ?date   "1 stent; replaced stent; not sure when I got the last stent" (04/23/2017)  . DOPPLER ECHOCARDIOGRAPHY  2009  . ELECTROPHYSIOLOGIC STUDY N/A 03/31/2016   Procedure: Cardioversion;  Surgeon: Evans Lance, MD;  Location: [place] CV LAB;  Service: Cardiovascular;  Laterality: N/A;  . ELECTROPHYSIOLOGIC STUDY N/A 08/04/2016   Procedure: Cardioversion;  Surgeon: Evans Lance, MD;  Location: [place] CV LAB;  Service: Cardiovascular;  Laterality: N/A;  . INSERT / REPLACE / REMOVE PACEMAKER  06/2007  . IR RADIOLOGY PERIPHERAL GUIDED IV START  01/31/2018  . IR US GUIDE VASC ACCESS LEFT  01/31/2018  . JOINT REPLACEMENT    . LAPAROSCOPIC CHOLECYSTECTOMY  1994  . LEFT AND RIGHT HEART CATHETERIZATION WITH CORONARY ANGIOGRAM N/A 10/06/2013   Procedure: LEFT AND RIGHT HEART CATHETERIZATION WITH CORONARY ANGIOGRAM;  Surgeon: Troy Sine, MD;  Location: [facility] CATH LAB;  Service: Cardiovascular;  Laterality: N/A;  . LEFT OOPHORECTOMY Left ~ 1989  . POSTERIOR LUMBAR FUSION  2000s - 04/2015 X 3   L3-4; L4-5; L2-3; Dr Trenton Gammon  .  RIGHT/LEFT HEART CATH AND CORONARY ANGIOGRAPHY N/A 05/09/2017   Procedure: RIGHT/LEFT HEART CATH AND CORONARY ANGIOGRAPHY;  Surgeon: Sherren Mocha, MD;  Location: [place] CV LAB;  Service: Cardiovascular;  Laterality: N/A;  . SKIN CANCER EXCISION Right    [place]  . TEE WITHOUT CARDIOVERSION N/A 09/29/2016   Procedure: TRANSESOPHAGEAL ECHOCARDIOGRAM (TEE);  Surgeon: Jerline Pain, MD;  Location: [place];  Service: Cardiovascular;  Laterality: N/A;  . TOTAL ABDOMINAL HYSTERECTOMY  1984   "uterus & right ovary"  . TOTAL KNEE ARTHROPLASTY Left 04/23/2017  . TOTAL KNEE ARTHROPLASTY Left 04/23/2017   Procedure: TOTAL KNEE ARTHROPLASTY;  Surgeon: Frederik Pear, MD;  Location: [place];  Service: Orthopedics;  Laterality: Left;  . ULTRASOUND GUIDANCE FOR VASCULAR ACCESS  05/09/2017   Procedure: Ultrasound Guidance For Vascular Access;  Surgeon: Sherren Mocha, MD;  Location: [place] CV LAB;  Service: Cardiovascular;;    Allergies  Allergen Reactions  . Adhesive [Tape] Rash and Other (See Comments)    Allergic to defibrillation pads.    Immunization History  Administered Date(s) Administered  . Fluad Quad(high Dose 65+) 04/10/2019  . Influenza Split 04/18/2011, 05/06/2012  . Influenza Whole 07/04/2007, 06/02/2009, 03/01/2010  . Influenza, High Dose Seasonal PF 04/03/2014, 04/18/2016, 04/05/2017, 05/06/2018  . Influenza,inj,Quad PF,6+ Mos 03/07/2013, 04/06/2015  . PFIZER SARS-COV-2 Vaccination 07/22/2019, 08/10/2019  . Pneumococcal Conjugate-13 09/24/2013  . Pneumococcal Polysaccharide-23 07/03/2005, 07/04/2007, 04/18/2011  . Td 06/02/2009  . Zoster 07/03/2006    Family History  Problem Relation Age of Onset  . Suicidality Father        suicide death pt was 3 yrs, 63  . Arrhythmia Mother   . Hypertension Mother   . Diabetes Mother   . Dementia Mother   . Heart attack Brother   . Heart disease Paternal Aunt   . Prostate cancer Maternal Grandfather   . Diabetes Paternal Grandfather        fathers side of the family  . Colon cancer Maternal Aunt      Current Facility-Administered Medications:  .  0.9 %  sodium chloride infusion, 250 mL, Intravenous, PRN, Agbata, Tochukwu, MD .  acetaminophen (TYLENOL) tablet 650 mg, 650 mg, Oral, Q4H PRN, Agbata, Tochukwu, MD .  albuterol (PROVENTIL) (2.5 MG/3ML) 0.083% nebulizer solution 2.5 mg, 2.5 mg, Nebulization, Q2H PRN, Agbata, Tochukwu, MD .  apixaban (ELIQUIS) tablet 5 mg, 5 mg, Oral, BID, Agbata, Tochukwu, MD, 5 mg at 05/19/20 2146 .  ascorbic acid (VITAMIN C) tablet 1,000 mg, 1,000 mg, Oral, QHS, Agbata, Tochukwu, MD, 1,000 mg at 05/19/20 2146 .  [START ON 05/20/2020] atorvastatin (LIPITOR) tablet 40  mg, 40 mg, Oral, Daily, Agbata, Tochukwu, MD .  benzonatate (TESSALON) capsule 200 mg, 200 mg, Oral, BID, Agbata, Tochukwu, MD, 200 mg at 05/19/20 2149 .  budesonide (PULMICORT) nebulizer solution 2 mg, 2 mg, Nebulization, BID, Agbata, Tochukwu, MD, 2 mg at 05/19/20 2057 .  [START ON 05/20/2020] calcium-vitamin D (OSCAL WITH D) 500-200 MG-UNIT per tablet 1 tablet, 1 tablet, Oral, Daily, Agbata, Tochukwu, MD .  Derrill Memo ON 05/20/2020] cefTRIAXone (ROCEPHIN) 1 g in sodium chloride 0.9 % 100 mL IVPB, 1 g, Intravenous, Q24H, Agbata, Tochukwu, MD .  Derrill Memo ON 05/20/2020] diltiazem (CARDIZEM CD) 24 hr capsule 360 mg, 360 mg, Oral, Daily, Agbata, Tochukwu, MD .  [COMPLETED] diltiazem (CARDIZEM) 1 mg/mL load via infusion 15 mg, 15 mg, Intravenous, Once, 15 mg at 05/19/20 2012 **AND** diltiazem (CARDIZEM) 125 mg in dextrose 5% 125 mL (1 mg/mL) infusion, 5-15 mg/hr,  Intravenous, Continuous, Truddie Hidden, MD, Last Rate: 15 mL/hr at 05/19/20 2142, 15 mg/hr at 05/19/20 2142 .  dofetilide (TIKOSYN) capsule 250 mcg, 250 mcg, Oral, BID, Agbata, Tochukwu, MD .  Derrill Memo ON 05/20/2020] doxycycline (VIBRAMYCIN) 100 mg in sodium chloride 0.9 % 250 mL IVPB, 100 mg, Intravenous, Q12H, Agbata, Tochukwu, MD .  Derrill Memo ON 05/20/2020] DULoxetine (CYMBALTA) DR capsule 60 mg, 60 mg, Oral, Daily, Agbata, Tochukwu, MD .  fluticasone (FLONASE) 50 MCG/ACT nasal spray 1 spray, 1 spray, Each Nare, QHS PRN, Agbata, Tochukwu, MD, 1 spray at 05/19/20 2138 .  insulin aspart (novoLOG) injection 0-20 Units, 0-20 Units, Subcutaneous, TID WC, Agbata, Tochukwu, MD .  ipratropium-albuterol (DUONEB) 0.5-2.5 (3) MG/3ML nebulizer solution 3 mL, 3 mL, Nebulization, Q6H, Agbata, Tochukwu, MD, 3 mL at 05/19/20 2058 .  loratadine (CLARITIN) tablet 10 mg, 10 mg, Oral, Daily, Agbata, Tochukwu, MD .  Derrill Memo ON 05/20/2020] multivitamin with minerals tablet 1 tablet, 1 tablet, Oral, Daily, Agbata, Tochukwu, MD .  nitroGLYCERIN (NITROSTAT) SL tablet 0.4  mg, 0.4 mg, Sublingual, Q5 Min x 3 PRN, Agbata, Tochukwu, MD .  ondansetron (ZOFRAN) injection 4 mg, 4 mg, Intravenous, Q6H PRN, Agbata, Tochukwu, MD .  Derrill Memo ON 05/20/2020] pantoprazole (PROTONIX) EC tablet 40 mg, 40 mg, Oral, Daily, Agbata, Tochukwu, MD .  polyvinyl alcohol (LIQUIFILM TEARS) 1.4 % ophthalmic solution 1 drop, 1 drop, Both Eyes, BID PRN, Agbata, Tochukwu, MD, 1 drop at 05/19/20 2139 .  potassium chloride SA (KLOR-CON) CR tablet 40 mEq, 40 mEq, Oral, TID, Agbata, Tochukwu, MD, 40 mEq at 05/19/20 2146 .  sodium chloride flush (NS) 0.9 % injection 3 mL, 3 mL, Intravenous, Q12H, Agbata, Tochukwu, MD .  sodium chloride flush (NS) 0.9 % injection 3 mL, 3 mL, Intravenous, PRN, Agbata, Tochukwu, MD .  torsemide (DEMADEX) tablet 40 mg, 40 mg, Oral, BID, Agbata, Tochukwu, MD, 40 mg at 05/19/20 2138 .  vitamin B-12 (CYANOCOBALAMIN) tablet 5,000 mcg, 5,000 mcg, Oral, QHS, Agbata, Tochukwu, MD, 5,000 mcg at 05/19/20 2146 .  vitamin E capsule 1,000 Units, 1,000 Units, Oral, QHS, Agbata, Tochukwu, MD   Significant Hospital Events   05/19/2020 - admit  Consults:  05/19/2020 - pulm  Procedures:  x  Significant Diagnostic Tests:  x  Micro Data:  cpvid pcr - 05/19/2020 0 neg Flu pcr 05/19/2020 neg Urine strep 11/17 Urine leg 11/17 Multiplex Resp PCR 11/17    Antimicrobials:   ceftriaxine Doxy azithro   Interim history/subjective:  05/19/2020 seen in 2w32  Objective   Blood pressure (!) 127/57, pulse (!) 141, temperature 99.9 F (37.7 C), temperature source Oral, resp. rate (!) 27, SpO2 91 %.        Intake/Output Summary (Last 24 hours) at 05/19/2020 2213 Last data filed at 05/19/2020 1745 Gross per 24 hour  Intake --  Output 500 ml  Net -500 ml   There were no vitals filed for this visit.  Examination: General: obese lady no distress. But using lot of o2 HENT: face mask o2 +, nasal cannula + Lungs: CTA. Mild barrell chest Cardiovascular: tachy   Afib Abdomen: soft Extremities: no eddema Neuro: axox3 GU: not examined  Resolved Hospital Problem list   x  Assessment & Plan:  Baseline OSA on CPAP Acute on chronic hypoxemic respiratory failure -severe but good work of breathing.  Currently DNR.  Requiring out of oxygen.  Suspect  -acute on chronic diastolic heart failure.   - Doubt pulmonary embolism because she is on chronic anticoagulation. -Doubt interstitial lung  disease which is a concern in 2018 but current CT chest only shows bilateral upper lobe infiltrates -Suspect viral pneumonitis -Suspect possible right to left shunt with severe pulmonary hypertension -2018 concern of pulmonary vein stenosis by cardiology needs to be addressed  Plan -Check for other respiratory viruses and urine strep and urine Legionella -Check procalcitonin - check lacatee - serial troponin -Continue current antibiotic therapy -Get bubble study echocardiogram -Check autoimmune panel in the setting of pulmonary hypertension -Cardiology to consider right heart catheterization -Used BiPAP overnight [at baseline she uses CPAP] -DNI if worsens according to CODE STATUS -   Best practice:  According to the hospitalist    ATTESTATION & SIGNATURE   The patient [name] is critically ill with multiple organ systems failure and requires high complexity decision making for assessment and support, frequent evaluation and titration of therapies, application of advanced monitoring technologies and extensive interpretation of multiple databases.   Critical Care Time devoted to patient care services described in this note is  60  Minutes. This time reflects time of care of this signee Dr Brand Males. This critical care time does not reflect procedure time, or teaching time or supervisory time of PA/NP/Med student/Med Resident etc but could involve care discussion time     Dr. Brand Males, M.D., [facility]C.P Pulmonary and Critical Care  Medicine Staff Physician [place] [facility] Critical [facility] Pager: [phone], If no answer or between  15:00h - 7:00h: call [phone]  05/19/2020 11:02 PM    LABS    PULMONARY Recent Labs  Lab 05/19/20 1545  PHART 7.456*  PCO2ART 51.6*  PO2ART 60*  HCO3 36.1*  TCO2 38*  O2SAT 90.0    CBC Recent Labs  Lab 05/19/20 1545 05/19/20 1946  HGB 15.6* 14.9  HCT 46.0 45.8  WBC  --  8.2  PLT  --  215    COAGULATION No results for input(s): INR in the last 168 hours.  CARDIAC  No results for input(s): TROPONINI in the last 168 hours. No results for input(s): PROBNP in the last 168 hours.   CHEMISTRY Recent Labs  Lab 05/19/20 1455 05/19/20 1545 05/19/20 1946  NA 136 134*  --   K 3.1* 2.9*  --   CL 88*  --   --   CO2 31  --   --   GLUCOSE 168*  --   --   BUN 27*  --   --   CREATININE 1.52*  --   --   CALCIUM 9.2  --   --   MG  --   --  2.1   Estimated Creatinine Clearance: 43.5 mL/min (A) (by C-G formula based on SCr of 1.52 mg/dL (H)).   LIVER No results for input(s): AST, ALT, ALKPHOS, BILITOT, PROT, ALBUMIN, INR in the last 168 hours.   INFECTIOUS No results for input(s): LATICACIDVEN, PROCALCITON in the last 168 hours.   ENDOCRINE CBG (last 3)  No results for input(s): GLUCAP in the last 72 hours.       IMAGING x48h  - image(s) personally visualized  -   highlighted in bold DG Chest 2 View  Result Date: 05/19/2020 CLINICAL DATA:  Pt having SOB, cough and congestion since last Friday - hx of AFIB, CHF, pacemaker, CAD, sleep apnea, TIA, MI EXAM: CHEST - 2 VIEW COMPARISON:  05/31/2018 FINDINGS: Cardiac silhouette is normal in size. No mediastinal or hilar masses. No evidence of adenopathy. Stable left  anterior chest wall sequential pacemaker. Mild chronic prominence of the interstitial and vascular markings. Lungs mildly hyperexpanded but otherwise clear. No pleural effusion or pneumothorax. Skeletal structures are  intact. IMPRESSION: 1. No acute cardiopulmonary disease. 2. Chronic lung hyperexpansion with mild prominence of the interstitial and vascular markings, without pulmonary edema. Electronically Signed   By: Lajean Manes M.D.   On: 05/19/2020 11:35   CT Chest Wo Contrast  Result Date: 05/19/2020 CLINICAL DATA:  Chest pain EXAM: CT CHEST WITHOUT CONTRAST TECHNIQUE: Multidetector CT imaging of the chest was performed following the standard protocol without IV contrast. COMPARISON:  05/31/2018 FINDINGS: Cardiovascular: Aortic atherosclerosis and coronary artery calcifications diffusely, most pronounced in the right coronary artery. Pacer wires noted in the right heart. Heart is normal size. Aorta normal caliber. Mediastinum/Nodes: Mediastinal and probable bilateral hilar adenopathy. Index pretracheal lymph node has a short axis diameter of 17 mm. Difficult to measure the hilar lymph nodes due to lack of IV contrast. Lungs/Pleura: Peribronchial thickening noted bilaterally. Areas of atelectasis. Ground-glass airspace opacities in the upper lobes and superior segment of the lower lobes bilaterally, right greater than left. No effusions. Upper Abdomen: Diffuse fatty infiltration of the liver. Prior cholecystectomy. Musculoskeletal: Chest wall soft tissues are unremarkable. No acute bony abnormality. IMPRESSION: Mediastinal and probable bilateral hilar adenopathy (difficult to visualize without intravenous contrast). This is unchanged since 2019. Peribronchial thickening and areas of atelectasis and ground-glass opacities throughout the upper lobes. Findings could reflect bronchitis. Cannot exclude early bronchopneumonia, particularly in the right upper lobe and superior segment of the right lower lobe. Coronary artery disease. Aortic Atherosclerosis (ICD10-I70.0). Electronically Signed   By: Rolm Baptise M.D.   On: 05/19/2020 17:53

## 2020-05-19 NOTE — ED Notes (Signed)
Noted pt was tachycardic 130-140 and spo2 dropped to 87%. EKG done and Dr Royce Macadamia paged

## 2020-05-19 NOTE — Progress Notes (Signed)
Based on pt's ABG, RT placed pt on 10L [place]. Pt tolerated well.

## 2020-05-19 NOTE — Progress Notes (Signed)
Results of CT scan findings noted. Suggestive of bronchitis vs bronchopneumonia. Will discontinue Furosemide and place patient on Rocephin and Doxycycline for presumed CAP Will consult intensivist due to patient's rapid deterioration and increasing oxygen need as patient is now on 10L HFNC to maintain pulse oximetry > 92%, she is also in rapid atrial fibrillation. Consult placed and discussed

## 2020-05-19 NOTE — Progress Notes (Signed)
Secure chat with Dr Stark Jock and Eulogio Bear RN re IV Team consult.  No medications ordered at this time.  Please refer lab draws to lab.  Notified to place IV team consult in once 2 RN 's have attempted.

## 2020-05-19 NOTE — ED Notes (Signed)
Attempted to gain IV access unsuccessful, IV team consult placed

## 2020-05-19 NOTE — ED Triage Notes (Signed)
Pt here from home with sob and sats 82 % at home,, pt has chf and has had to have O2 in the past but not recently

## 2020-05-19 NOTE — ED Notes (Signed)
Dr. Francine Graven paged to [phone]-per Vickii Chafe , RN paged by Levada Dy

## 2020-05-19 NOTE — ED Provider Notes (Signed)
Called to patient's room for a change in status. She has had an increase in her HR to the 140s. She has history of afib, has been ablated several times. She is taking Eliquis as directed. She is complaining now of palpitaions, but no chest pain. She was noted to be 88% on 10L [place], improved with transition to NRB. She does not have good IV access. R EJ placed under US guidance. Spoke with Dr. Radford Pax, on call for Cards who does not recommend electrical cardioversion due to likelihood it would not be successful. Recommends cardizem for rate control. Dr. Roosevelt Locks, Hospitalist, is also aware of this change. He will discuss with ICU.   Ultrasound ED Peripheral IV (Provider)  Date/Time: 05/19/2020 6:59 PM Performed by: Truddie Hidden, MD Authorized by: Truddie Hidden, MD   Procedure details:    Indications: multiple failed IV attempts and poor IV access     Skin Prep: chlorhexidine gluconate     Location: R EJ.   Angiocath:  20 G   Bedside Ultrasound Guided: Yes     Images: not archived     Patient tolerated procedure without complications: Yes     Dressing applied: Yes        Truddie Hidden, MD 05/19/20 1901

## 2020-05-19 NOTE — ED Notes (Signed)
EKG given to Dr Karle Starch and reviewed pt status. Dr Karle Starch to bedside to assess pt.

## 2020-05-19 NOTE — ED Notes (Signed)
Patient transported to X-ray 

## 2020-05-19 NOTE — Progress Notes (Signed)
Pt has home Cpap unit but does not want to wear at this time. She will call if changes mind.

## 2020-05-19 NOTE — ED Notes (Signed)
Pt placed on 2L of O2, per [place] - RN. Pt's O2 result at 91%.

## 2020-05-19 NOTE — ED Notes (Signed)
Attempted to obtain labs again, was able to draw 1 light green top. Dr Stark Jock aware.

## 2020-05-19 NOTE — ED Notes (Addendum)
Zoll monitor at bedside and pacer pads placed on pt. NRB applied and o2 at 15L

## 2020-05-19 NOTE — ED Notes (Signed)
Cards paged to [phone]-per Dr. Karle Starch.

## 2020-05-19 NOTE — ED Notes (Signed)
Pt with increased sob, tachypnea and spo2 decrease. Dr Stark Jock to pt bedside

## 2020-05-19 NOTE — H&P (Addendum)
History and Physical    [name] HWE:[id number] DOB: 10/16/1945 DOA: 05/19/2020  PCP: [name], MD   Patient coming from: Home  I have personally briefly reviewed patient's old medical records in [place]  Chief Complaint: Shortness of breath  HPI: [name] is a 74 y.o. female with medical history significant for morbid obesity, congestive heart failure, coronary artery disease status post stent angioplasty, history of A. fib status post ablation on chronic anticoagulation therapy with Eliquis, pulmonary hypertension status post pacemaker insertion who presents to the ER for evaluation of shortness of breath above her baseline which has progressively worsened over the last couple of days.  Patient states that she was exposed to her husband who developed a nonproductive cough about a week ago.  She started having symptoms about 5 days ago and it was associated with pain in her left ear as well as painful swallowing.   She contacted her primary care provider who recommended a course of Omnicef and to continue Delsym at home and to seek care in the emergency room if her symptoms did not improve. She denies having any chest pain, no fever, no chills, no nausea, no abdominal pain, no changes in her bowel habits or any urinary symptoms. Upon arrival to the emergency room she had room air pulse oximetry of 82% and is currently on 6 L of oxygen with improvement in her pulse oximetry to 94% Patient is immunized against the COVID-19 virus and had a home COVID-19 test which was negative.  She had a COVID-19 PCR test done in the ER here which was negative as well. Arterial blood gas 7.45/51/6036/90 Sodium 134, potassium 2.9, chloride 88, bicarb 31, glucose 168, BUN 27, creatinine 1.5, troponin XX, hemoglobin 15.6, hematocrit 46, Respiratory viral panel is negative Chest x-ray reviewed by me shows no acute cardiopulmonary disease but shows chronic lung hyperexpansion with mild  prominence of the interstitial and vascular markings, without pulmonary edema. Twelve-lead EKG shows sinus tachycardia with first-degree AV block     ED Course: Patient is a 74 year old Caucasian female with multiple medical problems who presents to the emergency room for evaluation of worsening shortness of breath above her baseline associated with a nonproductive cough she was noted to have diffuse wheezes and rhonchi on physical exam and received bronchodilator therapy as well as IV Solu-Medrol in the ER.  She also received IV Rocephin and Zithromax in the ER.  Patient had room air pulse oximetry of 82% and is currently on 6 L of oxygen with improvement in her pulse oximetry to 94%. She denies having any known history of COPD.  She will be admitted to the hospital for further evaluation and treatment  Review of Systems: As per HPI otherwise 10 point review of systems negative.    Past Medical History:  Diagnosis Date  . Anticoagulant long-term use    pradaxa  . Anxiety   . Arthritis    "fingers, lower back" (04/23/2017   . CAD (coronary artery disease) 8938,1017   post PTCA with bare-metal stenting to mid RCA in December 2004     . CHF (congestive heart failure) ([name])   . Chronic atrial fibrillation ([name]) 06/2007   Tachybradycardia pacemaker  . Chronic kidney disease    10% function - ?R, other kidney is compensating    . CVA (cerebral vascular accident) [name]) [id number]   denies residual on 04/23/2017  . Depression   . Diplopia 06/19/2008   Qualifier: Diagnosis of  ByRegis Bill MD, [name]   . Dysrhythmia    ATRIAL FIBRILATION  . Edema of lower extremity   . Hyperlipidemia   . Hypertension   . Inferior myocardial infarction [name])    acute inferior wall mi/other medical hx  . Myocardial infarction ([name]) [id number]  . Obesity   . OSA on CPAP    last test- 2010  . Pacemaker   . Pneumonia 2014   tx. ----  [name]  . Pulmonary hypertension ([name])    moderate pulmonary  hypertension by 10/2016 echo and 10/2013 cardiac cath  . Shortness of breath   . Skin cancer    "cut off right Harp; burned off LLE" (04/23/2017)  . Sleep apnea   . Spondylolisthesis   . TIA (transient ischemic attack) 2008  . Unspecified hemorrhoids without mention of complication 2/44/0102   Colonoscopy--Dr. Carlean Purl     Past Surgical History:  Procedure Laterality Date  . ABDOMINAL HYSTERECTOMY    . APPENDECTOMY  1984  . ATRIAL FIBRILLATION ABLATION N/A 09/29/2016   Procedure: Atrial Fibrillation Ablation;  Surgeon: Will Meredith Leeds, MD;  Location: [name];  Service: Cardiovascular;  Laterality: N/A;  . ATRIAL FIBRILLATION ABLATION N/A 02/07/2018   Procedure: ATRIAL FIBRILLATION ABLATION;  Surgeon: Constance Haw, MD;  Location: [name];  Service: Cardiovascular;  Laterality: N/A;  . ATRIAL FIBRILLATION ABLATION N/A 03/13/2019   Procedure: ATRIAL FIBRILLATION ABLATION;  Surgeon: Constance Haw, MD;  Location: [name];  Service: Cardiovascular;  Laterality: N/A;  . BACK SURGERY    . CARDIOVERSION N/A 09/12/2017   Procedure: CARDIOVERSION;  Surgeon: Jerline Pain, MD;  Location: [name];  Service: Cardiovascular;  Laterality: N/A;  . CARDIOVERSION N/A 12/13/2017   Procedure: CARDIOVERSION;  Surgeon: Sanda Klein, MD;  Location: [name];  Service: Cardiovascular;  Laterality: N/A;  . CARDIOVERSION N/A 02/18/2018   Procedure: CARDIOVERSION;  Surgeon: Dorothy Spark, MD;  Location: [name];  Service: Cardiovascular;  Laterality: N/A;  . CARDIOVERSION N/A 05/20/2018   Procedure: CARDIOVERSION;  Surgeon: [name], MD;  Location: [name];  Service: Cardiovascular;  Laterality: N/A;  . CARDIOVERSION N/A 04/16/2019   Procedure: CARDIOVERSION;  Surgeon: Donato Heinz, MD;  Location: [name];  Service: [name];  Laterality: N/A;  . CARDIOVERSION N/A 01/22/2020   Procedure: CARDIOVERSION;  Surgeon:  Geralynn Rile, MD;  Location: [name];  Service: Cardiovascular;  Laterality: N/A;  . CARDIOVERSION N/A 02/18/2020   Procedure: CARDIOVERSION;  Surgeon: Elouise Munroe, MD;  Location: [name];  Service: Cardiovascular;  Laterality: N/A;  . CATARACT EXTRACTION W/ INTRAOCULAR LENS  IMPLANT, BILATERAL Bilateral 01/15/2017- 03/2017  . CHOLECYSTECTOMY    . CORONARY ANGIOPLASTY  X 2  . CORONARY ANGIOPLASTY WITH STENT PLACEMENT  1998; ~ 2007; ?date   "1 stent; replaced stent; not sure when I got the last stent" (04/23/2017)  . DOPPLER ECHOCARDIOGRAPHY  2009  . ELECTROPHYSIOLOGIC STUDY N/A 03/31/2016   Procedure: Cardioversion;  Surgeon: Evans Lance, MD;  Location: [place] [name];  Service: Cardiovascular;  Laterality: N/A;  . ELECTROPHYSIOLOGIC STUDY N/A 08/04/2016   Procedure: Cardioversion;  Surgeon: Evans Lance, MD;  Location: [place] [name];  Service: Cardiovascular;  Laterality: N/A;  . INSERT / REPLACE / REMOVE PACEMAKER  06/2007  . IR RADIOLOGY PERIPHERAL GUIDED IV START  01/31/2018  . IR US GUIDE VASC ACCESS LEFT  01/31/2018  . JOINT REPLACEMENT    . LAPAROSCOPIC CHOLECYSTECTOMY  1994  . LEFT AND RIGHT HEART CATHETERIZATION WITH CORONARY ANGIOGRAM N/A 10/06/2013   Procedure: LEFT AND RIGHT HEART CATHETERIZATION WITH CORONARY ANGIOGRAM;  Surgeon: Troy Sine, MD;  Location: [name];  Service: Cardiovascular;  Laterality: N/A;  . LEFT OOPHORECTOMY Left ~ 1989  . POSTERIOR LUMBAR FUSION  2000s - 04/2015 X 3   L3-4; L4-5; L2-3; Dr Trenton Gammon  . RIGHT/LEFT HEART CATH AND CORONARY ANGIOGRAPHY N/A 05/09/2017   Procedure: RIGHT/LEFT HEART CATH AND CORONARY ANGIOGRAPHY;  Surgeon: Sherren Mocha, MD;  Location: [place] [name];  Service: Cardiovascular;  Laterality: N/A;  . SKIN CANCER EXCISION Right    [place]  . TEE WITHOUT CARDIOVERSION N/A 09/29/2016   Procedure: TRANSESOPHAGEAL ECHOCARDIOGRAM (TEE);  Surgeon: Jerline Pain, MD;  Location: [place] [name];  Service:  Cardiovascular;  Laterality: N/A;  . TOTAL ABDOMINAL HYSTERECTOMY  1984   "uterus & right ovary"  . TOTAL KNEE ARTHROPLASTY Left 04/23/2017  . TOTAL KNEE ARTHROPLASTY Left 04/23/2017   Procedure: TOTAL KNEE ARTHROPLASTY;  Surgeon: Frederik Pear, MD;  Location: [place];  Service: Orthopedics;  Laterality: Left;  . ULTRASOUND GUIDANCE FOR VASCULAR ACCESS  05/09/2017   Procedure: Ultrasound Guidance For Vascular Access;  Surgeon: Sherren Mocha, MD;  Location: [place] [name];  Service: Cardiovascular;;     reports that she quit smoking about 37 years ago. Her smoking use included cigarettes. She started smoking about 42 years ago. She has a 5.00 pack-year smoking history. She has never used smokeless tobacco. She reports current alcohol use. She reports that she does not use drugs.  Allergies  Allergen Reactions  . Adhesive [Tape] Rash and Other (See Comments)    Allergic to defibrillation pads.    Family History  Problem Relation Age of Onset  . Suicidality Father        suicide death pt was 3 yrs, 79  . Arrhythmia Mother   . Hypertension Mother   . Diabetes Mother   . Dementia Mother   . Heart attack Brother   . Heart disease Paternal Aunt   . Prostate cancer Maternal Grandfather   . Diabetes Paternal Grandfather        fathers side of the family  . Colon cancer Maternal Aunt      Prior to Admission medications   Medication Sig Start Date End Date Taking? Authorizing Provider  Ascorbic Acid (VITAMIN C) 1000 MG tablet Take 1,000 mg by mouth at bedtime.    Yes [provider]  atorvastatin (LIPITOR) 40 MG tablet TAKE 1 TABLET BY MOUTH EVERY DAY Patient taking differently: Take 40 mg by mouth daily.  09/16/19  Yes Camnitz, Ocie Doyne, MD  Calcium Carbonate-Vitamin D (CALCIUM 600+D PO) Take 1 tablet by mouth daily.    Yes [provider]  cefdinir (OMNICEF) 300 MG capsule Take 1 capsule (300 mg total) by mouth 2 (two) times daily. 05/17/20  Yes [name], [name], MD  Cholecalciferol (VITAMIN D3) 125 MCG (5000 UT) TABS Take 5,000 Units by mouth at bedtime.    Yes [provider]  Cyanocobalamin (B-12) 5000 MCG CAPS Take 5,000 mcg by mouth at bedtime.    Yes [provider]  diltiazem (CARDIZEM LA) 360 MG 24 hr tablet Take 1 tablet (360 mg total) by mouth daily. 03/30/20  Yes Camnitz, Will Hassell Done, MD  dofetilide (TIKOSYN) 250 MCG capsule Take 1 capsule (250 mcg total) by mouth 2 (two) times daily. 03/01/20  Yes Sherran Needs, NP  DULoxetine (CYMBALTA) 60  MG capsule TAKE 1 CAPSULE BY MOUTH EVERY DAY Patient taking differently: Take 60 mg by mouth daily.  04/26/20  Yes [name], [name], MD  ELIQUIS 5 MG TABS tablet TAKE 1 TABLET BY MOUTH TWICE A DAY Patient taking differently: Take 5 mg by mouth 2 (two) times daily.  04/28/20  Yes Camnitz, Will Hassell Done, MD  fluticasone (FLONASE) 50 MCG/ACT nasal spray Place 1 spray into both nostrils at bedtime as needed for allergies.    Yes [provider]  loratadine (CLARITIN) 10 MG tablet TAKE 1 TABLET BY MOUTH EVERY DAY Patient taking differently: Take 10 mg by mouth daily.  04/07/20  Yes [name], [name], MD  Lysine 1000 MG TABS Take 1,000 mg by mouth at bedtime.    Yes [provider]  metolazone (ZAROXOLYN) 2.5 MG tablet Take 1 tablet (2.5 mg total) by mouth every 4 days AS NEEDED for swelling Patient taking differently: Take 2.5 mg by mouth See admin instructions. Take 1 tablet (2.5 mg total) by mouth every 4 days AS NEEDED for swelling 05/11/20  Yes Camnitz, Will Hassell Done, MD  Multiple Vitamin (MULTIVITAMIN WITH MINERALS) TABS tablet Take 1 tablet by mouth daily. Centrum   Yes [provider]  nitroGLYCERIN (NITROSTAT) 0.4 MG SL tablet PLACE 1 TABLET (0.4 MG TOTAL) UNDER THE TONGUE EVERY 5 (FIVE) MINUTES X 3 DOSES AS NEEDED FOR CHEST PAIN. 05/17/20  Yes Nahser, Wonda Cheng, MD  nystatin-triamcinolone (MYCOLOG II) cream Apply 1 application topically 2 (two) times daily as  needed. Patient taking differently: Apply 1 application topically 2 (two) times daily as needed (rash).  01/28/20  Yes [name], [name], MD  omeprazole (PRILOSEC) 20 MG capsule Take 20 mg by mouth daily before breakfast.    Yes [provider]  Polyethyl Glycol-Propyl Glycol (SYSTANE) 0.4-0.3 % SOLN Place 1 drop into both eyes 2 (two) times daily as needed (dry/irritated eyes.).    Yes [provider]  potassium chloride SA (KLOR-CON M20) 20 MEQ tablet Take 1 tablet (20 mEq total) by mouth 2 (two) times daily. Please make yearly appt with Dr. Acie Fredrickson for January 2022 for future refills. Thank you 1st attempt 05/04/20  Yes Nahser, Wonda Cheng, MD  torsemide (DEMADEX) 20 MG tablet Take 2 tablets (40 mg total) by mouth 2 (two) times daily. 07/11/19  Yes Nahser, Wonda Cheng, MD  valACYclovir (VALTREX) 1000 MG tablet Take 2 tablets (2,000 mg total) by mouth every 12 (twelve) hours as needed (fever blisters/cold sores.). 10/17/19  Yes [name], [name], MD  vitamin E 1000 UNIT capsule Take 1,000 Units by mouth at bedtime.    Yes [provider]    Physical Exam: Vitals:   05/19/20 1430 05/19/20 1502 05/19/20 1624 05/19/20 1630  BP: (!) 122/58 (!) 109/48  119/60  Pulse: 93 96  74  Resp: (!) 30 19  (!) 26  Temp:      TempSrc:      SpO2: 92% 94% 90% 91%     Vitals:   05/19/20 1430 05/19/20 1502 05/19/20 1624 05/19/20 1630  BP: (!) 122/58 (!) 109/48  119/60  Pulse: 93 96  74  Resp: (!) 30 19  (!) 26  Temp:      TempSrc:      SpO2: 92% 94% 90% 91%    Constitutional: NAD, alert and oriented x 3.  Appears comfortable and in no distress Eyes: PERRL, lids and conjunctivae normal ENMT: Mucous membranes are moist.  Neck: normal, supple, no masses, no thyromegaly  Respiratory: Coarse breath sounds bilaterally, diffuse wheezes, rhonchi. Normal respiratory effort. No accessory muscle use.  Cardiovascular: Sinus tachycardia, no murmurs / rubs / gallops. No extremity edema. 2+ pedal pulses.  No carotid bruits.  Abdomen: no tenderness, no masses palpated. No hepatosplenomegaly. Bowel sounds positive.  [name] adiposity Musculoskeletal: no clubbing / cyanosis. No joint deformity upper and lower extremities.  Skin: no rashes, lesions, ulcers.  Neurologic: No gross focal neurologic deficit. Psychiatric: Normal mood and affect.   Labs on Admission: I have personally reviewed following labs and imaging studies  CBC: Recent Labs  [name] 05/19/20 1545  HGB 15.6*  HCT 56.2   Basic Metabolic Panel: Recent Labs  [name] 05/19/20 1455 05/19/20 1545  NA 136 134*  K 3.1* 2.9*  CL 88*  --   CO2 31  --   GLUCOSE 168*  --   BUN 27*  --   CREATININE 1.52*  --   CALCIUM 9.2  --    GFR: Estimated Creatinine Clearance: 43.5 mL/min (A) (by C-G formula based on SCr of 1.52 mg/dL (H)). Liver Function Tests: No results for input(s): AST, ALT, ALKPHOS, BILITOT, PROT, ALBUMIN in the last 168 hours. No results for input(s): LIPASE, AMYLASE in the last 168 hours. No results for input(s): AMMONIA in the last 168 hours. Coagulation Profile: No results for input(s): INR, PROTIME in the last 168 hours. Cardiac Enzymes: No results for input(s): CKTOTAL, CKMB, CKMBINDEX, TROPONINI in the last 168 hours. BNP (last 3 results) No results for input(s): PROBNP in the last 8760 hours. HbA1C: No results for input(s): HGBA1C in the last 72 hours. CBG: No results for input(s): GLUCAP in the last 168 hours. Lipid Profile: No results for input(s): CHOL, HDL, LDLCALC, TRIG, CHOLHDL, LDLDIRECT in the last 72 hours. Thyroid Function Tests: No results for input(s): TSH, T4TOTAL, FREET4, T3FREE, THYROIDAB in the last 72 hours. Anemia Panel: No results for input(s): VITAMINB12, FOLATE, FERRITIN, TIBC, IRON, RETICCTPCT in the last 72 hours. Urine analysis:    Component Value Date/Time   COLORURINE YELLOW 05/31/2018 [place] 05/31/2018 1159   LABSPEC 1.011 05/31/2018 1159   PHURINE 6.0  05/31/2018 1159   GLUCOSEU NEGATIVE 05/31/2018 1159   HGBUR NEGATIVE 05/31/2018 1159   HGBUR negative 06/01/2010 0841   BILIRUBINUR NEGATIVE 05/31/2018 1159   BILIRUBINUR n 11/04/2012 1057   KETONESUR NEGATIVE 05/31/2018 1159   PROTEINUR NEGATIVE 05/31/2018 1159   UROBILINOGEN 0.2 11/04/2012 1057   UROBILINOGEN 0.2 06/01/2010 0841   NITRITE NEGATIVE 05/31/2018 1159   LEUKOCYTESUR NEGATIVE 05/31/2018 1159    Radiological Exams on Admission: DG Chest 2 View  Result Date: 05/19/2020 CLINICAL DATA:  Pt having SOB, cough and congestion since last Friday - hx of AFIB, CHF, pacemaker, CAD, sleep apnea, TIA, MI EXAM: CHEST - 2 VIEW COMPARISON:  05/31/2018 FINDINGS: Cardiac silhouette is normal in size. No mediastinal or hilar masses. No evidence of adenopathy. Stable left anterior chest wall sequential pacemaker. Mild chronic prominence of the interstitial and vascular markings. Lungs mildly hyperexpanded but otherwise clear. No pleural effusion or pneumothorax. Skeletal structures are intact. IMPRESSION: 1. No acute cardiopulmonary disease. 2. Chronic lung hyperexpansion with mild prominence of the interstitial and vascular markings, without pulmonary edema. Electronically Signed   By: Lajean Manes M.D.   On: 05/19/2020 11:35    EKG: Independently reviewed.  Sinus tachycardia First-degree AV block  Assessment/Plan Principal Problem:   Acute respiratory failure (HCC) Active Problems:   Essential hypertension   CAD S/P percutaneous coronary  angioplasty   Paroxysmal atrial fibrillation (HCC)   Pulmonary hypertension (HCC)   Severe obesity (BMI >= 40) (HCC)   Controlled type 2 diabetes mellitus without complication (HCC)   Hypokalemia     Acute respiratory failure (POA) As evidenced by room air pulse oximetry of 82% associated with tachypnea and increased work of breathing as well as PaO2 of 60 mmHg Etiology of acute respiratory failure may be secondary to acute diastolic dysfunction  CHF versus acute COPD exacerbation Patient denies having any known history of COPD but has a history of CHF She is currently on 6 L of oxygen which has improved her pulse oximetry to 94% Follow-up results of CT scan of the chest without contrast to rule out possible pneumonia versus CHF Acute PE was on the differential but patient is on chronic anticoagulation therapy with Eliquis and states that she is compliant with prescribed therapy    Possible acute on chronic diastolic dysfunction CHF Patient's last 2D echocardiogram was from 2019 that showed an LVEF of 55 to 60% with severe pulmonary hypertension We will place patient on Lasix 40 mg IV every 12 Follow-up results of repeat 2D echocardiogram   COPD with acute exacerbation Patient does not have a known history of COPD Chest x-ray shows hyperinflated lung fields patient presents with a nonproductive cough and has diffuse wheezes Place patient on scheduled and as needed bronchodilator therapy Place patient on inhaled steroids Add antitussives    History of paroxysmal A. Fib Status post ablation Continue Cardizem and dofetilide Continue Eliquis as primary prophylaxis for an acute stroke   History of coronary artery disease Continue apixaban, statins and as needed nitrates    Diabetes mellitus Maintain consistent carbohydrate diet Place patient on sliding scale insulin for glycemic control   Morbid obesity Complicates overall prognosis and care   Hypokalemia Secondary to diuretic therapy Supplement potassium Check magnesium levels    DVT prophylaxis: Apixaban Code Status: DO NOT RESUSCITATE Family Communication: Greater than 50% of time was spent discussing patient's condition and plan of care with her at the bedside.  All questions and concerns have been addressed.  She verbalizes understanding and agrees with the plan. Disposition Plan: Back to previous home environment Consults called: None    Daylene Vandenbosch MD [facility]     05/19/2020, 4:43 PM

## 2020-05-19 NOTE — ED Notes (Signed)
Dr Karle Starch attempting to obtain EJ with ultrasound and pt has small IV

## 2020-05-20 ENCOUNTER — Inpatient Hospital Stay (HOSPITAL_COMMUNITY): Payer: PPO

## 2020-05-20 ENCOUNTER — Other Ambulatory Visit: Payer: Self-pay

## 2020-05-20 DIAGNOSIS — E876 Hypokalemia: Secondary | ICD-10-CM

## 2020-05-20 DIAGNOSIS — Q211 Atrial septal defect: Secondary | ICD-10-CM

## 2020-05-20 DIAGNOSIS — E119 Type 2 diabetes mellitus without complications: Secondary | ICD-10-CM | POA: Diagnosis not present

## 2020-05-20 DIAGNOSIS — Z9861 Coronary angioplasty status: Secondary | ICD-10-CM

## 2020-05-20 DIAGNOSIS — J9601 Acute respiratory failure with hypoxia: Secondary | ICD-10-CM | POA: Diagnosis not present

## 2020-05-20 DIAGNOSIS — I2510 Atherosclerotic heart disease of native coronary artery without angina pectoris: Secondary | ICD-10-CM | POA: Diagnosis not present

## 2020-05-20 DIAGNOSIS — I10 Essential (primary) hypertension: Secondary | ICD-10-CM

## 2020-05-20 LAB — RESPIRATORY PANEL BY PCR

## 2020-05-20 LAB — ECHOCARDIOGRAM COMPLETE BUBBLE STUDY
Area-P 1/2: 2.9 cm2
S' Lateral: 3 cm

## 2020-05-20 LAB — BASIC METABOLIC PANEL
Anion gap: 16 — ABNORMAL HIGH (ref 5–15)
BUN: 29 mg/dL — ABNORMAL HIGH (ref 8–23)
CO2: 29 mmol/L (ref 22–32)
Calcium: 8.6 mg/dL — ABNORMAL LOW (ref 8.9–10.3)
Chloride: 90 mmol/L — ABNORMAL LOW (ref 98–111)
Creatinine, Ser: 1.52 mg/dL — ABNORMAL HIGH (ref 0.44–1.00)
GFR, Estimated: 36 mL/min — ABNORMAL LOW (ref >60)
Glucose, Bld: 420 mg/dL — ABNORMAL HIGH (ref 70–99)
Potassium: 3.3 mmol/L — ABNORMAL LOW (ref 3.5–5.1)
Sodium: 135 mmol/L (ref 135–145)

## 2020-05-20 LAB — CYCLIC CITRUL PEPTIDE ANTIBODY, IGG/IGA: CCP Antibodies IgG/IgA: 146 units — ABNORMAL HIGH (ref 0–19)

## 2020-05-20 LAB — GLUCOSE, CAPILLARY
Glucose-Capillary: 336 mg/dL — ABNORMAL HIGH (ref 70–99)
Glucose-Capillary: 261 mg/dL — ABNORMAL HIGH (ref 70–99)
Glucose-Capillary: 166 mg/dL — ABNORMAL HIGH (ref 70–99)
Glucose-Capillary: 230 mg/dL — ABNORMAL HIGH (ref 70–99)

## 2020-05-20 LAB — TROPONIN I (HIGH SENSITIVITY)
Troponin I (High Sensitivity): 27 ng/L — ABNORMAL HIGH (ref <18)
Troponin I (High Sensitivity): 28 ng/L — ABNORMAL HIGH (ref <18)

## 2020-05-20 LAB — PROCALCITONIN: Procalcitonin: 0.23 ng/mL

## 2020-05-20 LAB — MRSA PCR SCREENING: MRSA by PCR: NEGATIVE

## 2020-05-20 LAB — LACTIC ACID, PLASMA
Lactic Acid, Venous: 4.5 mmol/L (ref 0.5–1.9)
Lactic Acid, Venous: 4.2 mmol/L (ref 0.5–1.9)

## 2020-05-20 LAB — SEDIMENTATION RATE: Sed Rate: 28 mm/hr — ABNORMAL HIGH (ref 0–22)

## 2020-05-20 LAB — STREP PNEUMONIAE URINARY ANTIGEN: Strep Pneumo Urinary Antigen: NEGATIVE

## 2020-05-20 MED ORDER — GUAIFENESIN-DM 100-10 MG/5ML PO SYRP
5.0000 mL | ORAL_SOLUTION | ORAL | Status: DC | PRN
  Administered 2020-05-22 – 2020-05-26 (×2): 5 mL via ORAL
  Filled 2020-05-20 (×2): qty 5

## 2020-05-20 MED ORDER — ACETAMINOPHEN 325 MG PO TABS
650.0000 mg | ORAL_TABLET | ORAL | Status: DC | PRN
  Administered 2020-05-20: 650 mg via ORAL
  Filled 2020-05-20: qty 2

## 2020-05-20 MED ORDER — PROSOURCE PLUS PO LIQD
30.0000 mL | Freq: Two times a day (BID) | ORAL | Status: DC
  Administered 2020-05-21 – 2020-05-28 (×14): 30 mL via ORAL
  Filled 2020-05-20 (×24): qty 30

## 2020-05-20 MED ORDER — POTASSIUM CHLORIDE CRYS ER 20 MEQ PO TBCR
40.0000 meq | EXTENDED_RELEASE_TABLET | ORAL | Status: AC
  Administered 2020-05-20 (×2): 40 meq via ORAL
  Filled 2020-05-20 (×2): qty 2

## 2020-05-20 MED ORDER — REVEFENACIN 175 MCG/3ML IN SOLN
175.0000 ug | Freq: Every day | RESPIRATORY_TRACT | Status: DC
  Filled 2020-05-20 (×2): qty 3

## 2020-05-20 MED ORDER — ALBUMIN HUMAN 25 % IV SOLN
25.0000 g | Freq: Once | INTRAVENOUS | Status: AC
  Administered 2020-05-20: 25 g via INTRAVENOUS
  Filled 2020-05-20: qty 100

## 2020-05-20 MED ORDER — HYDROCOD POLST-CPM POLST ER 10-8 MG/5ML PO SUER
5.0000 mL | Freq: Two times a day (BID) | ORAL | Status: DC
  Administered 2020-05-20 – 2020-05-26 (×12): 5 mL via ORAL
  Filled 2020-05-20 (×12): qty 5

## 2020-05-20 MED ORDER — ARFORMOTEROL TARTRATE 15 MCG/2ML IN NEBU
15.0000 ug | INHALATION_SOLUTION | Freq: Two times a day (BID) | RESPIRATORY_TRACT | Status: DC
  Administered 2020-05-20 – 2020-06-02 (×24): 15 ug via RESPIRATORY_TRACT
  Filled 2020-05-20 (×27): qty 2

## 2020-05-20 MED ORDER — FUROSEMIDE 10 MG/ML IJ SOLN
60.0000 mg | Freq: Two times a day (BID) | INTRAMUSCULAR | Status: DC
  Administered 2020-05-20 – 2020-05-21 (×2): 60 mg via INTRAVENOUS
  Filled 2020-05-20 (×2): qty 6

## 2020-05-20 MED ORDER — DILTIAZEM HCL 25 MG/5ML IV SOLN
10.0000 mg | Freq: Once | INTRAVENOUS | Status: DC | PRN
  Filled 2020-05-20: qty 5

## 2020-05-20 MED ORDER — IPRATROPIUM-ALBUTEROL 0.5-2.5 (3) MG/3ML IN SOLN
3.0000 mL | Freq: Four times a day (QID) | RESPIRATORY_TRACT | Status: DC
  Administered 2020-05-20: 3 mL via RESPIRATORY_TRACT
  Filled 2020-05-20 (×2): qty 3

## 2020-05-20 MED ORDER — GLUCERNA SHAKE PO LIQD
237.0000 mL | Freq: Three times a day (TID) | ORAL | Status: DC
  Administered 2020-05-20 – 2020-05-31 (×19): 237 mL via ORAL
  Filled 2020-05-20 (×2): qty 237

## 2020-05-20 MED ORDER — PHENOL 1.4 % MT LIQD
1.0000 | OROMUCOSAL | Status: DC | PRN
  Administered 2020-05-20: 1 via OROMUCOSAL
  Filled 2020-05-20 (×2): qty 177

## 2020-05-20 NOTE — Progress Notes (Signed)
CRITICAL VALUE ALERT  Critical Value:  Lactic acid 4.5  Date & Time Notied:  05/20/20 01:02  Provider Notified: Opyd  Orders Received/Actions taken:

## 2020-05-20 NOTE — Progress Notes (Signed)
  Echocardiogram 2D Echocardiogram has been performed.  [name] 05/20/2020, 3:44 PM

## 2020-05-20 NOTE — Progress Notes (Signed)
[name], MRN:  [id number], DOB:  December 06, 1945, LOS: 1 ADMISSION DATE:  05/19/2020, CONSULTATION DATE:  05/19/2020  REFERRING MD:  TRH MD, CHIEF COMPLAINT:  Acute resp failure   Brief History   [name] is a 74 year old woman with atrial fibrillation, type 2 diabetes, hypertension, OSA on CPAP, HFpEF with mild restrictive lung disease and mild diffusion defect who presented to [facility] on 05/19/20 with progressive shortness of breath and has been admitted for acute on chronic hypxemic respiratory failure. She is found to have positive RSV on respiratory pathogen panel.   History of present illness - talking to patent and chart review   Background Pulm History  - Obese lady.  With coronary artery disease that is post PTCA, A. fib, type 2 diabetes and hypertension. .  Used to be followed by Dr Rachell Cipro - [id number] for OSA (AHI 21/h in 2010)  and CPAP for which back then good compliance was reported.  Then in May 2018 2018 she she had an admission for diastolic heart failure.  In the aftermath of that she continues to have dyspnea and exertional desaturation in the setting of amiodarone therapy and there was concern that she had interstitial lung disease and was seen in the pulmonary clinic.  However radiology felt the CT scan was most consistent with heart failure.  With subsequent diuresis at follow-up in October 2018 she was improved though there was still crackles and there is clinical concern for ILD.  She was referred for heart catheterization and underwent this in November 2018.  Her V waves are prominent.  Her mean pulmonary pressure was elevated at 48 mm per mercury and a high cardiac output of 8.5 L/min with only minimally elevated pulmonary vascular resistance of 3 Wood units.  Dr. Burt Knack recommended pulmonary vein isolation to rule out pulmonary vein stenosis.  In addition patient also had single-vessel coronary artery disease but with continued patency of the stented segment in the  right coronary artery.  After that she was lost to pulmonary follow-up.  Review of the chart indicate atrial fibrillation has been a problem and an ablation has been planned by Dr. Curt Bears in January 2022.  She appears to no longer be on amiodarone.?  Stop date  She really presents now on May 19, 2020.  She tells at baseline she was not using oxygen at home.  She was exposed to her husband was sick with nonproductive cough.  Then she developed pain in her left ear.  She took outpatient antibiotics but did not respond she got progressively short of breath and got admitted to the hospital.  He had Covid and flu test have been negative.  She has had a Covid vaccine.  She required facemask and nasal cannula oxygen.  Chest x-ray just shows hyperinflation.  She had a CT chest without contrast [not a high-resolution CT chest] shows groundglass opacities in the upper lobes but otherwise no presence of chronic ILD.  She is on chronic eliquis  Pulmonary has been consulted  [facility] Events   05/19/2020 - admit  Consults:  05/19/2020 - pulm  Procedures:    Significant Diagnostic Tests:  CT Chest 05/19/20 Mediastinal and probable bilateral hilar adenopathy (difficult to visualize without intravenous contrast). This is unchanged since 2019.  Peribronchial thickening and areas of atelectasis and ground-glass opacities throughout the upper lobes. Findings could reflect bronchitis. Cannot exclude early bronchopneumonia, particularly in the right upper lobe and superior segment of the right lower lobe.  Micro Data:  cpvid pcr - 05/19/2020  neg Flu pcr 05/19/2020 neg Urine strep 11/17 Urine leg 11/17 Multiplex Resp PCR 11/17 - Postive for RSV   Antimicrobials:   ceftriaxine 11/17>> Doxy 11/17>> azithro 11/17   Interim history/subjective:  Patient remains on NRB mask. Husband at the bedside. She is without new complaints at this time.  Objective   Blood pressure (!)  142/64, pulse (!) 120, temperature 98 F (36.7 C), temperature source Oral, resp. rate 20, height 5' 6.5" (1.689 m), weight 121 kg, SpO2 97 %.    FiO2 (%):  [100 %] 100 %   Intake/Output Summary (Last 24 hours) at 05/20/2020 1222 Last data filed at 05/20/2020 1205 Gross per 24 hour  Intake 580.34 ml  Output 2100 ml  Net -1519.66 ml   Filed Weights   05/20/20 0255  Weight: 121 kg    Examination: General: obese lady no distress. NRB mask in place HENT: sclera anicteric, moist mucous membranes Lungs: End inspiratory wheezes - scattered Cardiovascular: irregular irregular, tachycardic Abdomen: soft, non-tender, non-distended Extremities: trace edema Neuro: A&Ox3  Resolved Hospital Problem list     Assessment & Plan:  Acute Hypoxemic respiratory Failure In setting of RSV pneumonia and underlying mild restrictive/diffusion defects (2018 PFTs)  - Complete course of doxycycline. Ok to discontinue ceftriaxone and azithromycin - Continue budesonide nebulizer treatments. Add brovana and yupleri nebs. Discontinue duoneb treatments and add as needed albuterol.  - hold off on further systemic steroids at this time  - Wean oxygen as able to maintain saturations above 88%  Hx of OSA on CPAP - Continue CPAP or Bipap at night   LABS    PULMONARY Recent Labs  Lab 05/19/20 1545  PHART 7.456*  PCO2ART 51.6*  PO2ART 60*  HCO3 36.1*  TCO2 38*  O2SAT 90.0    CBC Recent Labs  Lab 05/19/20 1545 05/19/20 1946  HGB 15.6* 14.9  HCT 46.0 45.8  WBC  --  8.2  PLT  --  215    COAGULATION No results for input(s): INR in the last 168 hours.  CARDIAC  No results for input(s): TROPONINI in the last 168 hours. No results for input(s): PROBNP in the last 168 hours.   CHEMISTRY Recent Labs  Lab 05/19/20 1455 05/19/20 1455 05/19/20 1545 05/19/20 1946 05/20/20 0133  NA 136  --  134*  --  135  K 3.1*   < > 2.9*  --  3.3*  CL 88*  --   --   --  90*  CO2 31  --   --   --  29    GLUCOSE 168*  --   --   --  420*  BUN 27*  --   --   --  29*  CREATININE 1.52*  --   --   --  1.52*  CALCIUM 9.2  --   --   --  8.6*  MG  --   --   --  2.1  --    < > = values in this interval not displayed.   Estimated Creatinine Clearance: 43.4 mL/min (A) (by C-G formula based on SCr of 1.52 mg/dL (H)).   LIVER No results for input(s): AST, ALT, ALKPHOS, BILITOT, PROT, ALBUMIN, INR in the last 168 hours.   INFECTIOUS Recent Labs  Lab 05/19/20 2323 05/20/20 0133  LATICACIDVEN 4.5* 4.2*  PROCALCITON  --  0.23     ENDOCRINE CBG (last 3)  Recent Labs    05/20/20 8315  GLUCAP [place], MD [facility] Office: [phone]   See Amion for Pager Details

## 2020-05-20 NOTE — Progress Notes (Signed)
PROGRESS NOTE    [name]  VOZ:[id number] DOB: 26-Mar-1946 DOA: 05/19/2020 PCP: [name], MD    Brief Narrative:  [name] to the hospital working diagnosis of acute hypoxic respiratory failure.  74 year old female past medical history for obesity class III, diastolic heart failure, coronary artery disease status post angioplasty, atrial fibrillation, pulmonary hypertension, who presented with dyspnea. Patient reported 5 days of dry cough, refractory to outpatient management with oral antibiotics.  She had persistent and worsening symptoms that prompted her to come to the hospital.  On her initial physical examination her oximetry was 82% on 6 L supplemental oxygen, blood pressure 122/58, heart rate ninety-three, respiratory rate thirty, oxygen saturation 90%, she had coarse breath sounds bilaterally, diffuse wheezing and rhonchi, heart S1-S2, present, tachycardic, abdomen soft, no lower extremity edema.  Sodium one thirty-six, potassium 3.1, chloride eighty-eight, bicarb thirty-one, glucose one sixty-eight, BUN twenty-seven, creatinine 1.52, troponin I twenty, white count 8.2, hemoglobin 14.9, hematocrit 45.8, platelets two fifteen.  Respiratory panel positive for RSV.  SARS COVID-19 negative. Chest radiograph with congested hilar vasculature bilaterally.  No infiltrates. Chest CT with bilateral groundglass opacities. EKG 98 bpm, normal axis, first-degree AV block, sinus rhythm, Q waves in V1-V2, no significant ST segment or T wave changes.   Patient was placed on supplemental 02 per non re-breather and broad spectrum antibiotic therapy,  Developed atrial fibrillation with RVR and place on IV diltiazem.   Assessment & Plan:   Principal Problem:   Acute respiratory failure (HCC) Active Problems:   Essential hypertension   CAD S/P percutaneous coronary angioplasty   Paroxysmal atrial fibrillation (HCC)   Pulmonary hypertension (HCC)   Severe obesity (BMI >= 40)  (HCC)   Controlled type 2 diabetes mellitus without complication (HCC)   Hypokalemia   1. Acute hypoxemic respiratory failure due to decompensated diastolic heart failure with acute cardiogenic pulmonary edema.  Patient continue hypervolemic, oxymetry is 95% on non re-breather mask.   Start aggressive diuresis with furosemide 60 mg IV q12, target a negative fluid balance.  Follow up on echocardiogram.   2. Atrial fibrillation with rapid ventricular response. Heart rate now down to 76 bpm, will continue AV blockade with oral diltiazem and dofetilide.   Continue anticoagulation with apixaban.   3. RSV bronchitis. Continue supportive medical therapy, discontinue antibiotics and steroids.  Continue antitussive agents and as needed bronchodilator therapy.  On arfomoterol and budesonide.   4. T2DM/ dyslipidemia. Continue glucose control with insulin sliding scale, patient is tolerating po well.   Continue with statin therapy.   5. AKI with Hypokalemia Continue K correction with Kcl, 40 meq x2 and follow up on renal function in am, continue diuresis with IV furosemide for hypervolemia.   Patient continue to be at high risk for worsening respiratory failure   Status is: Inpatient  Remains inpatient appropriate because:IV treatments appropriate due to intensity of illness or inability to take PO   Dispo: The patient is from: Home              Anticipated d/c is to: Home              Anticipated d/c date is: 3 days              Patient currently is not medically stable to d/c.    DVT prophylaxis:  apixaban   Code Status:   full  Family Communication:  No family at the bedside       Consultants:  Pulmonary   Subjective: Patient continue to have dyspnea, cough and congestion, positive wheezing and sore throat.   Objective: Vitals:   05/20/20 0951 05/20/20 1124 05/20/20 1314 05/20/20 1350  BP: 129/63 (!) 142/64 (!) 116/54   Pulse:   76   Resp: 20 20 16    Temp:   98.7  F (37.1 C)   TempSrc:   Oral   SpO2:  97% 95% 95%  Weight:      Height:        Intake/Output Summary (Last 24 hours) at 05/20/2020 1549 Last data filed at 05/20/2020 1205 Gross per 24 hour  Intake 580.34 ml  Output 2100 ml  Net -1519.66 ml   Filed Weights   05/20/20 0255  Weight: 121 kg    Examination:   General: positive dyspnea at rest.  Neurology: Awake and alert, non focal  E ENT: no pallor, no icterus, oral mucosa moist Cardiovascular: No JVD. S1-S2 present, rhythmic, no gallops, rubs, or murmurs. trace lower extremity edema. Pulmonary: positive breath sounds bilaterally, decreased air movement, positive wheezing, with scattered rhonchi and rales. Gastrointestinal. Abdomen soft and non tender Skin. No rashes Musculoskeletal: no joint deformities     Data Reviewed: I have personally reviewed following labs and imaging studies  CBC: Recent Labs  Lab 05/19/20 1545 05/19/20 1946  WBC  --  8.2  HGB 15.6* 14.9  HCT 46.0 45.8  MCV  --  94.4  PLT  --  409   Basic Metabolic Panel: Recent Labs  Lab 05/19/20 1455 05/19/20 1545 05/19/20 1946 05/20/20 0133  NA 136 134*  --  135  K 3.1* 2.9*  --  3.3*  CL 88*  --   --  90*  CO2 31  --   --  29  GLUCOSE 168*  --   --  420*  BUN 27*  --   --  29*  CREATININE 1.52*  --   --  1.52*  CALCIUM 9.2  --   --  8.6*  MG  --   --  2.1  --    GFR: Estimated Creatinine Clearance: 43.4 mL/min (A) (by C-G formula based on SCr of 1.52 mg/dL (H)). Liver Function Tests: No results for input(s): AST, ALT, ALKPHOS, BILITOT, PROT, ALBUMIN in the last 168 hours. No results for input(s): LIPASE, AMYLASE in the last 168 hours. No results for input(s): AMMONIA in the last 168 hours. Coagulation Profile: No results for input(s): INR, PROTIME in the last 168 hours. Cardiac Enzymes: No results for input(s): CKTOTAL, CKMB, CKMBINDEX, TROPONINI in the last 168 hours. BNP (last 3 results) No results for input(s): PROBNP in the last  8760 hours. HbA1C: Recent Labs    05/19/20 1946  HGBA1C 8.1*   CBG: Recent Labs  Lab 05/20/20 0728 05/20/20 1227  GLUCAP 336* 261*   Lipid Profile: No results for input(s): CHOL, HDL, LDLCALC, TRIG, CHOLHDL, LDLDIRECT in the last 72 hours. Thyroid Function Tests: No results for input(s): TSH, T4TOTAL, FREET4, T3FREE, THYROIDAB in the last 72 hours. Anemia Panel: No results for input(s): VITAMINB12, FOLATE, FERRITIN, TIBC, IRON, RETICCTPCT in the last 72 hours.    Radiology Studies: I have reviewed all of the imaging during this hospital visit personally     Scheduled Meds: . apixaban  5 mg Oral BID  . arformoterol  15 mcg Nebulization BID  . vitamin C  1,000 mg Oral QHS  . atorvastatin  40 mg Oral Daily  . benzonatate  200 mg Oral BID  .  budesonide (PULMICORT) nebulizer solution  2 mg Nebulization BID  . calcium-vitamin D  1 tablet Oral Daily  . diltiazem  360 mg Oral Daily  . dofetilide  250 mcg Oral BID  . DULoxetine  60 mg Oral Daily  . insulin aspart  0-20 Units Subcutaneous TID WC  . loratadine  10 mg Oral Daily  . multivitamin with minerals  1 tablet Oral Daily  . pantoprazole  40 mg Oral Daily  . revefenacin  175 mcg Nebulization Daily  . sodium chloride flush  3 mL Intravenous Q12H  . torsemide  40 mg Oral BID  . vitamin B-12  5,000 mcg Oral QHS  . vitamin E  1,000 Units Oral QHS   Continuous Infusions: . sodium chloride    . cefTRIAXone (ROCEPHIN)  IV 1 g (05/20/20 1333)  . diltiazem (CARDIZEM) infusion 5 mg/hr (05/20/20 1336)  . doxycycline (VIBRAMYCIN) IV 100 mg (05/20/20 0948)     LOS: 1 day        Arlett Goold Gerome Apley, MD

## 2020-05-20 NOTE — Progress Notes (Signed)
  Echocardiogram 2D Echocardiogram has been attempted. Patient HR 143-145. Will reattempt at later time.  Randa Lynn Soul Hackman 05/20/2020, 9:47 AM

## 2020-05-20 NOTE — Evaluation (Signed)
Physical Therapy Evaluation Patient Details Name: [name] MRN: [id number] DOB: February 12, 1946 Today's Date: 05/20/2020   History of Present Illness  74 y.o. female with medical history significant for morbid obesity, congestive heart failure, coronary artery disease status post stent angioplasty, history of A. fib status post ablation on chronic anticoagulation therapy with Eliquis, pulmonary hypertension status post pacemaker insertion who presents to the ER on 11/17 for worsening shortness of breath. Workup for acute on chronic diastolic heart failure, RSV.  Clinical Impression   Pt presents with dyspnea on exertion, increased time and effort to mobilize vs baseline, and decreased activity tolerance. Pt to benefit from acute PT to address deficits. Pt ambulated hallway distance without AD, intermittent standing rest breaks needed to recover dyspnea. Pt maintained SpO2 88% and greater on 8LO2, pt on 15LO2 via NRB upon PT arrival. RN notified of lower O2 requirements. PT to progress mobility as tolerated, and will continue to follow acutely.      Follow Up Recommendations No PT follow up;Supervision for mobility/OOB    Equipment Recommendations  None recommended by PT    Recommendations for Other Services       Precautions / Restrictions Precautions Precautions: Other (comment) Precaution Comments: watch O2 and HR Restrictions Weight Bearing Restrictions: No      Mobility  Bed Mobility Overal bed mobility: Needs Assistance Bed Mobility: Supine to Sit     Supine to sit: Min assist;HOB elevated     General bed mobility comments: min assist for trunk elevation off of bed    Transfers Overall transfer level: Needs assistance Equipment used: None Transfers: Sit to/from Stand Sit to Stand: Supervision         General transfer comment: for safety, stand pivot to [facility]. STS x3, from EOB x2 and BSC x1.  Ambulation/Gait Ambulation/Gait assistance: Supervision Gait Distance  (Feet): 220 Feet Assistive device: None Gait Pattern/deviations: Step-through pattern;Decreased stride length Gait velocity: decr   General Gait Details: supervision for safety, increased time and standing rest breaks as needed to recover DOE 2/4, RRmax 38 breaths/min. HRmax 146 bpm  Stairs            Wheelchair Mobility    Modified Rankin (Stroke Patients Only)       Balance Overall balance assessment: Mild deficits observed, not formally tested                                           Pertinent Vitals/Pain Pain Assessment: No/denies pain    Home Living Family/patient expects to be discharged to:: Private residence Living Arrangements: Spouse/significant other Available Help at Discharge: Family;Available 24 hours/day Type of Home: House Home Access: Ramped entrance     Home Layout: Two level;Able to live on main level with bedroom/bathroom Home Equipment: Gilford Rile - 2 wheels;Walker - 4 wheels;Cane - single point;Bedside commode;Shower seat;Grab bars - tub/shower;Hand held shower head;Adaptive equipment;Wheelchair - manual      Prior Function Level of Independence: Independent               Hand Dominance   Dominant Hand: Right    Extremity/Trunk Assessment   Upper Extremity Assessment Upper Extremity Assessment: Defer to OT evaluation    Lower Extremity Assessment Lower Extremity Assessment: Overall WFL for tasks assessed    Cervical / Trunk Assessment Cervical / Trunk Assessment: Other exceptions Cervical / Trunk Exceptions: obesity  Communication   Communication:  No difficulties  Cognition Arousal/Alertness: Awake/alert Behavior During Therapy: WFL for tasks assessed/performed Overall Cognitive Status: Within Functional Limits for tasks assessed                                        General Comments      Exercises     Assessment/Plan    PT Assessment Patient needs continued PT services  PT  Problem List Decreased mobility;Decreased safety awareness;Cardiopulmonary status limiting activity;Decreased balance;Decreased activity tolerance;Decreased knowledge of precautions       PT Treatment Interventions DME instruction;Therapeutic activities;Gait training;Therapeutic exercise;Balance training;Functional mobility training;Patient/family education;Neuromuscular re-education    PT Goals (Current goals can be found in the Care Plan section)  Acute Rehab PT Goals PT Goal Formulation: With patient Time For Goal Achievement: 06/03/20 Potential to Achieve Goals: Good    Frequency Min 3X/week   Barriers to discharge        Co-evaluation               AM-PAC PT "6 Clicks" Mobility  Outcome Measure Help needed turning from your back to your side while in a flat bed without using bedrails?: A Little Help needed moving from lying on your back to sitting on the side of a flat bed without using bedrails?: A Little Help needed moving to and from a bed to a chair (including a wheelchair)?: A Little Help needed standing up from a chair using your arms (e.g., wheelchair or bedside chair)?: A Little Help needed to walk in hospital room?: A Little Help needed climbing 3-5 steps with a railing? : A Little 6 Click Score: 18    End of Session Equipment Utilized During Treatment: Oxygen Activity Tolerance: Patient tolerated treatment well Patient left: in bed;with call bell/phone within reach Nurse Communication: Mobility status PT Visit Diagnosis: Other abnormalities of gait and mobility (R26.89);Difficulty in walking, not elsewhere classified (R26.2)    Time: 8657-8469 PT Time Calculation (min) (ACUTE ONLY): 27 min   Charges:   PT Evaluation $PT Eval Low Complexity: 1 Low PT Treatments $Gait Training: 8-22 mins       Mykaela Arena E, PT Acute Rehabilitation Services Pager [phone]  Office [phone]    Daphyne Miguez D Elonda Husky 05/20/2020, 4:44 PM

## 2020-05-20 NOTE — Progress Notes (Signed)
PT Cancellation Note  Patient Details Name: MIGDALIA OLEJNICZAK MRN: [id number] DOB: 06/16/1946   Cancelled Treatment:    Reason Eval/Treat Not Completed: Patient at procedure or test/unavailable - pt getting echo, will check back as schedule allows.  [place] Pager [phone]  Office [phone]     Julien Girt 05/20/2020, 2:21 PM

## 2020-05-20 NOTE — Progress Notes (Signed)
   05/20/20 2015  Assess: MEWS Score  Pulse Rate 73  ECG Heart Rate (!) 111  Resp 17  SpO2 (!) 88 %  Assess: MEWS Score  MEWS Temp 1  MEWS Systolic 0  MEWS Pulse 2  MEWS RR 0  MEWS LOC 0  MEWS Score 3  MEWS Score Color Yellow  Assess: if the MEWS score is Yellow or Red  Were vital signs taken at a resting state? Yes  Focused Assessment No change from prior assessment  Early Detection of Sepsis Score *See Row Information* Medium  MEWS guidelines implemented *See Row Information* No, previously yellow, continue vital signs every 4 hours  Treat  MEWS Interventions Administered prn meds/treatments  Pain Scale 0-10  Pain Score 0  Notify: Charge Nurse/RN  Name of Charge Nurse/RN Notified Bernette Redbird, RN  Date Charge Nurse/RN Notified 05/20/20  Time Charge Nurse/RN Notified 2030  Notify: Provider  Provider Name/Title Opyd, MD  Date Provider Notified 05/20/20  Time Provider Notified 2018  Notification Type Page  Notification Reason Other (Comment) (yellow mews new for shift)  Response See new orders  Date of Provider Response 05/20/20  Time of Provider Response 2023  Document  Patient Outcome Stabilized after interventions  Progress note created (see row info) Yes  New onset of fever, HR elevated, no change in pt assessment, no change in mental status. Tylenol given, fever resolved. Will continue to monitor pt.

## 2020-05-20 NOTE — Progress Notes (Signed)
Initial Nutrition Assessment  DOCUMENTATION CODES:   Morbid obesity  INTERVENTION:   Glucerna Shake po TID, each supplement provides 220 kcal and 10 grams of protein  52ml Prosource Plus po BID, each supplement provides 100 kcals and 15 grams of protein  Continue MVI daily   NUTRITION DIAGNOSIS:   Increased nutrient needs related to acute illness as evidenced by estimated needs.    GOAL:   Patient will meet greater than or equal to 90% of their needs    MONITOR:   PO intake, Supplement acceptance, Labs, Weight trends, I & O's  REASON FOR ASSESSMENT:   Consult Assessment of nutrition requirement/status  ASSESSMENT:   Pt admitted with acute respiratory failure in setting of RSV pneumonia and underlying mild restrictive/diffusion defects. PMH includes Afib, type 2 DM, HTN, OSA on CPAP, CHF.  Pt unavailable at time of RD visit.  No PO intake documented.   Reviewed wt history. No significant wt changes noted.   UOP: 1L x24 hours   Labs: K+ 3.3 (L), CBGs 261-336 Medications: vitamin C, oscal with D, ss novolog, mvi, protonix, vitamin B12, vitamin E    NUTRITION - FOCUSED PHYSICAL EXAM:  Unable to perform at this time; will attempt at follow-up.   Diet Order:   Diet Order            Diet Carb Modified Fluid consistency: Thin; Room service appropriate? Yes  Diet effective now                 EDUCATION NEEDS:   No education needs have been identified at this time  Skin:  Skin Assessment: Reviewed RN Assessment  Last BM:  1/17  Height:   Ht Readings from Last 1 Encounters:  05/20/20 5' 6.5" (1.689 m)    Weight:   Wt Readings from Last 1 Encounters:  05/20/20 121 kg    BMI:  Body mass index is 42.41 kg/m.  Estimated Nutritional Needs:   Kcal:  2100-2300  Protein:  130-150 grams  Fluid:  >2L/d    Larkin Ina, MS, RD, LDN RD pager number and weekend/on-call pager number located in [place].

## 2020-05-20 NOTE — Progress Notes (Signed)
Pt unable to maintain O2 above 90% on HFNC, pt placed back on non-rebreather mask. SpO2 96%-100% on non-rebreather mask WIll continue to monitor pt.

## 2020-05-20 NOTE — Evaluation (Signed)
Occupational Therapy Evaluation Patient Details Name: [name] MRN: [id number] DOB: Nov 24, 1945 Today's Date: 05/20/2020    History of Present Illness 74 y.o. female with medical history significant for morbid obesity, congestive heart failure, coronary artery disease status post stent angioplasty, history of A. fib status post ablation on chronic anticoagulation therapy with Eliquis, pulmonary hypertension status post pacemaker insertion who presents to the ER on 11/17 for worsening shortness of breath. Workup for acute on chronic diastolic heart failure, found to be positive for RSV.   Clinical Impression   Pt reports that PTA she was independent in ADL and mobility. She does have access to DME for mobility but does not use it - even when out and about in community. She does sit for showering and does not wear socks - mostly bedroom slippers or slip ons (although she has sock donner and AE for LB ADL). Today she is on 15L O2 via NRB, SpO2 remained >92 throughout session including transfer. HR 109-124 with activity, up to 144 with coughing fit. She was min guard for stand pivot transfer due to lines, no physical assist at all. Pt has all necessary DME and AE at home, OT will follow acutely and do not anticipate post-acute OT needs. Next session to focus on O2 monitoring, EC education.      Follow Up Recommendations  No OT follow up;Supervision/Assistance - 24 hour (initially)    Equipment Recommendations  None recommended by OT (Pt has appropriate DME at home)    Recommendations for Other Services       Precautions / Restrictions Precautions Precaution Comments: watch O2 and HR Restrictions Weight Bearing Restrictions: No      Mobility Bed Mobility Overal bed mobility: Modified Independent             General bed mobility comments: HOB elevated    Transfers Overall transfer level: Needs assistance Equipment used: None Transfers: Sit to/from Stand;Stand Pivot  Transfers Sit to Stand: Min guard Stand pivot transfers: Min guard       General transfer comment: min guard for line management    Balance                                           ADL either performed or assessed with clinical judgement   ADL Overall ADL's : Needs assistance/impaired Eating/Feeding: Set up;Sitting   Grooming: Set up;Sitting   Upper Body Bathing: Min guard;With adaptive equipment;Sitting   Lower Body Bathing: Min guard;With adaptive equipment;Sitting/lateral leans   Upper Body Dressing : Set up;Sitting   Lower Body Dressing: Minimal assistance;Sit to/from stand Lower Body Dressing Details (indicate cue type and reason): Pt has sock aide at home, uses bedroom slippers Toilet Transfer: Min Statistician Details (indicate cue type and reason): simulated with recliner transfer Powell and Hygiene: Moderate assistance       Functional mobility during ADLs: Min guard (SPT only)       Vision Baseline Vision/History: Wears glasses Wears Glasses: At all times Patient Visual Report: No change from baseline       Perception     Praxis      Pertinent Vitals/Pain Pain Assessment: No/denies pain     Hand Dominance Right   Extremity/Trunk Assessment Upper Extremity Assessment Upper Extremity Assessment: Overall WFL for tasks assessed   Lower Extremity Assessment Lower Extremity Assessment: Defer to PT evaluation  Cervical / Trunk Assessment Cervical / Trunk Assessment: Other exceptions Cervical / Trunk Exceptions: obesity   Communication Communication Communication: No difficulties   Cognition Arousal/Alertness: Awake/alert Behavior During Therapy: WFL for tasks assessed/performed Overall Cognitive Status: Within Functional Limits for tasks assessed                                     General Comments  HR 109-144 (stayed between 109-125 with transfer - jupms  up to 140's with coughing fit); Pt on 15L via NRB, SpO2 remained 92% or higher throughout session, BP WFL    Exercises     Shoulder Instructions      Home Living Family/patient expects to be discharged to:: Private residence Living Arrangements: Spouse/significant other Available Help at Discharge: Family;Available 24 hours/day Type of Home: House Home Access: Ramped entrance     Home Layout: Two level;Able to live on main level with bedroom/bathroom Alternate Level Stairs-Number of Steps: flight   Bathroom Shower/Tub: Occupational psychologist: Handicapped height Bathroom Accessibility: Yes How Accessible: Accessible via wheelchair Home Equipment: Pease - 2 wheels;Walker - 4 wheels;Cane - single point;Bedside commode;Shower seat;Grab bars - tub/shower;Hand held shower head;Adaptive equipment;Wheelchair - Diplomatic Services operational officer Equipment: Sock aid;Reacher;Long-handled sponge        Prior Functioning/Environment Level of Independence: Independent                 OT Problem List: Decreased activity tolerance;Cardiopulmonary status limiting activity      OT Treatment/Interventions: Self-care/ADL training;Energy conservation;Therapeutic activities;Patient/family education;Balance training    OT Goals(Current goals can be found in the care plan section) Acute Rehab OT Goals Patient Stated Goal: to breathe better OT Goal Formulation: With patient/family Time For Goal Achievement: 06/03/20 Potential to Achieve Goals: Good ADL Goals Pt Will Perform Grooming: with modified independence;sitting Pt Will Perform Upper Body Dressing: with modified independence;sitting Pt Will Perform Lower Body Dressing: sit to/from stand;with supervision Pt Will Transfer to Toilet: with modified independence;ambulating Pt Will Perform Toileting - Clothing Manipulation and hygiene: with modified independence;sit to/from stand Additional ADL Goal #1: Pt will recall and implement 3 energy  conservation strategies during ADL at independent level  OT Frequency: Min 2X/week   Barriers to D/C:            Co-evaluation              AM-PAC OT "6 Clicks" Daily Activity     Outcome Measure Help from another person eating meals?: None Help from another person taking care of personal grooming?: A Little Help from another person toileting, which includes using toliet, bedpan, or urinal?: A Little Help from another person bathing (including washing, rinsing, drying)?: A Little Help from another person to put on and taking off regular upper body clothing?: A Little Help from another person to put on and taking off regular lower body clothing?: A Little 6 Click Score: 19   End of Session Equipment Utilized During Treatment: Oxygen (15L via NRB) Nurse Communication: Mobility status  Activity Tolerance: Patient tolerated treatment well Patient left: in chair;with call bell/phone within reach;with family/visitor present  OT Visit Diagnosis: Other abnormalities of gait and mobility (R26.89);Other (comment) (cardiopulmonary status limiting activity)                Time: 7253-6644 OT Time Calculation (min): 33 min Charges:  OT General Charges $OT Visit: 1 Visit OT Evaluation $OT Eval Moderate Complexity: 1 Mod  OT Treatments $Self Care/Home Management : 8-22 mins  Jesse Sans OTR/L Acute Rehabilitation Services Pager: [phone] Office: [place] 05/20/2020, 1:16 PM

## 2020-05-20 NOTE — Plan of Care (Signed)

## 2020-05-21 DIAGNOSIS — I480 Paroxysmal atrial fibrillation: Secondary | ICD-10-CM

## 2020-05-21 DIAGNOSIS — J9601 Acute respiratory failure with hypoxia: Secondary | ICD-10-CM | POA: Diagnosis not present

## 2020-05-21 DIAGNOSIS — E876 Hypokalemia: Secondary | ICD-10-CM | POA: Diagnosis not present

## 2020-05-21 DIAGNOSIS — E119 Type 2 diabetes mellitus without complications: Secondary | ICD-10-CM | POA: Diagnosis not present

## 2020-05-21 DIAGNOSIS — I2510 Atherosclerotic heart disease of native coronary artery without angina pectoris: Secondary | ICD-10-CM | POA: Diagnosis not present

## 2020-05-21 LAB — CBC WITH DIFFERENTIAL/PLATELET
Abs Immature Granulocytes: 0.03 10*3/uL (ref 0.00–0.07)
Basophils Absolute: 0.1 10*3/uL (ref 0.0–0.1)
Basophils Relative: 0 %
Eosinophils Absolute: 0 10*3/uL (ref 0.0–0.5)
Eosinophils Relative: 0 %
HCT: 46.4 % — ABNORMAL HIGH (ref 36.0–46.0)
Hemoglobin: 14.9 g/dL (ref 12.0–15.0)
Immature Granulocytes: 0 %
Lymphocytes Relative: 16 %
Lymphs Abs: 1.9 10*3/uL (ref 0.7–4.0)
MCH: 31 pg (ref 26.0–34.0)
MCHC: 32.1 g/dL (ref 30.0–36.0)
MCV: 96.5 fL (ref 80.0–100.0)
Monocytes Absolute: 1.2 10*3/uL — ABNORMAL HIGH (ref 0.1–1.0)
Monocytes Relative: 10 %
Neutro Abs: 8.4 10*3/uL — ABNORMAL HIGH (ref 1.7–7.7)
Neutrophils Relative %: 74 %
Platelets: 209 10*3/uL (ref 150–400)
RBC: 4.81 MIL/uL (ref 3.87–5.11)
RDW: 13.4 % (ref 11.5–15.5)
WBC: 11.5 10*3/uL — ABNORMAL HIGH (ref 4.0–10.5)
nRBC: 0 % (ref 0.0–0.2)

## 2020-05-21 LAB — ANTI-SCLERODERMA ANTIBODY: Scleroderma (Scl-70) (ENA) Antibody, IgG: <0.2 AI (ref 0.0–0.9)

## 2020-05-21 LAB — GLUCOSE, CAPILLARY
Glucose-Capillary: 237 mg/dL — ABNORMAL HIGH (ref 70–99)
Glucose-Capillary: 159 mg/dL — ABNORMAL HIGH (ref 70–99)
Glucose-Capillary: 130 mg/dL — ABNORMAL HIGH (ref 70–99)
Glucose-Capillary: 251 mg/dL — ABNORMAL HIGH (ref 70–99)

## 2020-05-21 LAB — BASIC METABOLIC PANEL
Anion gap: 13 (ref 5–15)
BUN: 32 mg/dL — ABNORMAL HIGH (ref 8–23)
CO2: 36 mmol/L — ABNORMAL HIGH (ref 22–32)
Calcium: 9.6 mg/dL (ref 8.9–10.3)
Chloride: 93 mmol/L — ABNORMAL LOW (ref 98–111)
Creatinine, Ser: 1.53 mg/dL — ABNORMAL HIGH (ref 0.44–1.00)
GFR, Estimated: 35 mL/min — ABNORMAL LOW (ref >60)
Glucose, Bld: 256 mg/dL — ABNORMAL HIGH (ref 70–99)
Potassium: 4.7 mmol/L (ref 3.5–5.1)
Sodium: 142 mmol/L (ref 135–145)

## 2020-05-21 LAB — LACTIC ACID, PLASMA: Lactic Acid, Venous: 2.8 mmol/L (ref 0.5–1.9)

## 2020-05-21 LAB — ANTI-DNA ANTIBODY, DOUBLE-STRANDED: ds DNA Ab: 2 IU/mL (ref 0–9)

## 2020-05-21 LAB — RHEUMATOID FACTOR: Rheumatoid fact SerPl-aCnc: <10 IU/mL (ref 0.0–13.9)

## 2020-05-21 LAB — MAGNESIUM: Magnesium: 2.3 mg/dL (ref 1.7–2.4)

## 2020-05-21 MED ORDER — FUROSEMIDE 10 MG/ML IJ SOLN
20.0000 mg | Freq: Once | INTRAMUSCULAR | Status: AC
  Administered 2020-05-21: 20 mg via INTRAVENOUS
  Filled 2020-05-21: qty 2

## 2020-05-21 MED ORDER — DILTIAZEM HCL 25 MG/5ML IV SOLN
10.0000 mg | Freq: Once | INTRAVENOUS | Status: AC | PRN
  Administered 2020-05-21: 10 mg via INTRAVENOUS
  Filled 2020-05-21: qty 5

## 2020-05-21 MED ORDER — VITAMIN E 180 MG (400 UNIT) PO CAPS
800.0000 [IU] | ORAL_CAPSULE | Freq: Every day | ORAL | Status: DC
  Administered 2020-05-21 – 2020-06-01 (×12): 800 [IU] via ORAL
  Filled 2020-05-21 (×12): qty 2

## 2020-05-21 MED ORDER — VITAMIN E 45 MG (100 UNIT) PO CAPS
200.0000 [IU] | ORAL_CAPSULE | Freq: Every day | ORAL | Status: DC
  Administered 2020-05-21 – 2020-06-01 (×12): 200 [IU] via ORAL
  Filled 2020-05-21 (×12): qty 2

## 2020-05-21 MED ORDER — FUROSEMIDE 10 MG/ML IJ SOLN
80.0000 mg | Freq: Two times a day (BID) | INTRAMUSCULAR | Status: DC
  Administered 2020-05-21: 80 mg via INTRAVENOUS
  Filled 2020-05-21: qty 8

## 2020-05-21 MED ORDER — INSULIN GLARGINE 100 UNIT/ML ~~LOC~~ SOLN
10.0000 [IU] | Freq: Every day | SUBCUTANEOUS | Status: DC
  Administered 2020-05-21 – 2020-05-26 (×6): 10 [IU] via SUBCUTANEOUS
  Filled 2020-05-21 (×6): qty 0.1

## 2020-05-21 MED ORDER — FUROSEMIDE 40 MG PO TABS
40.0000 mg | ORAL_TABLET | Freq: Two times a day (BID) | ORAL | Status: DC
  Administered 2020-05-22: 40 mg via ORAL
  Filled 2020-05-21: qty 1

## 2020-05-21 MED ORDER — METOPROLOL TARTRATE 25 MG PO TABS
25.0000 mg | ORAL_TABLET | Freq: Two times a day (BID) | ORAL | Status: DC
  Administered 2020-05-21 – 2020-05-22 (×4): 25 mg via ORAL
  Filled 2020-05-21 (×5): qty 1

## 2020-05-21 MED ORDER — IPRATROPIUM-ALBUTEROL 0.5-2.5 (3) MG/3ML IN SOLN
3.0000 mL | Freq: Four times a day (QID) | RESPIRATORY_TRACT | Status: DC | PRN
  Administered 2020-05-26 – 2020-06-01 (×5): 3 mL via RESPIRATORY_TRACT
  Filled 2020-05-21 (×4): qty 3

## 2020-05-21 NOTE — Progress Notes (Addendum)
PROGRESS NOTE    [name]  MWN:[id number] DOB: 05/13/1946 DOA: 05/19/2020 PCP: [name], MD    Brief Narrative:  [name] was admitted to the hospital working diagnosis of acute hypoxic respiratory failure related to acute diastolic heart failure complicated with atrial fibrillation with RVR in the setting of acute bronchitis due to RSV.  74 year old female past medical history for obesity class III, diastolic heart failure, coronary artery disease status post angioplasty, atrial fibrillation, pulmonary hypertension, who presented with dyspnea. Patient reported 5 days of dry cough, refractory to outpatient management with oral antibiotics.  She had persistent and worsening symptoms that prompted her to come to the hospital.  On her initial physical examination her oximetry was 82% on 6 L supplemental oxygen, blood pressure 122/58, heart rate ninety-three, respiratory rate thirty, oxygen saturation 90%, she had coarse breath sounds bilaterally, diffuse wheezing and rhonchi, heart S1-S2, present, tachycardic, abdomen soft, no lower extremity edema.  Sodium one thirty-six, potassium 3.1, chloride eighty-eight, bicarb thirty-one, glucose one sixty-eight, BUN twenty-seven, creatinine 1.52, troponin I twenty, white count 8.2, hemoglobin 14.9, hematocrit 45.8, platelets two fifteen.  Respiratory panel positive for RSV.  SARS COVID-19 negative. Chest radiograph with congested hilar vasculature bilaterally.  No infiltrates. Chest CT with bilateral groundglass opacities. EKG 98 bpm, normal axis, first-degree AV block, sinus rhythm, Q waves in V1-V2, no significant ST segment or T wave changes.   Patient was placed on supplemental 02 per non re-breather and broad spectrum antibiotic therapy,  Developed atrial fibrillation with RVR and place on IV diltiazem.   Started on aggressive diuresis with furosemide.    Assessment & Plan:   Principal Problem:   Acute respiratory failure (HCC)  Active Problems:   Essential hypertension   CAD S/P percutaneous coronary angioplasty   Paroxysmal atrial fibrillation (HCC)   Pulmonary hypertension (HCC)   Severe obesity (BMI >= 40) (HCC)   Controlled type 2 diabetes mellitus without complication (HCC)   Hypokalemia    1. Acute hypoxemic respiratory failure due to decompensated diastolic heart failure with acute cardiogenic pulmonary edema. Pulmonary hypertension.  Urine output over last 24 hr 1,650 ml, oxygenation is 92 and 95% on non re-breather mask.  Echocardiogram with preserved LV systolic function EF 55 to 60%, elevated PA pressure 63 mmHg systolic.   Increase diuresis with furosemide 80 mg IV q12, to continue target negative fluid balance.   Continue close telemetry monitoring   2. Atrial fibrillation with rapid ventricular response. Off diltiazem drip she had recurrent RVR this am, up to 140 to 150, with positive palpitations and dyspnea.  Patient on diltiazem 360 mg plus dofetilide. Add metoprolol tartrate 25 mg bid, for better rate control.   Old records personally reviewed: She follows with Dr. Cheryln Manly as outpatient, has diagnosis ofd tachybradycardia syndrome sp pacemaker ([place] dual chamber), sp ablation x3 for her atrial fibrillation (last on 09/20).  Plan to repeat ablation.   Anticoagulation with apixaban.  Consult cardiology for inpatient follow up, she has difficult to control atrial fibrillation.   3. RSV bronchitis. Keep oxygen saturation more than 92%, continue with bronchodilator therapy, (change to PRN). On antitussive agents along with arfomoterol and budesonide.   4. T2DM uncontrolled with hyperglycemia/ dyslipidemia.  fasting glucose this am is 256 mg/dl, now patient is off steroids, continue insulin sliding scale for glucose cover and monitoring.  Add low dose basal insulin 10 units and continue close monitoring.   On statin therapy.   5. AKI with Hypokalemia K  is 4,7 and Mg at 2,3.  Continue close monitoring of renal function and urine output, continue aggressive diuresis with furosemide IV.   6. Depression. Continue with duloxetine  Patient continue to be at high risk for worsening heart failure and atrial fibrillation.   Status is: Inpatient  Remains inpatient appropriate because:IV treatments appropriate due to intensity of illness or inability to take PO   Dispo: The patient is from: Home              Anticipated d/c is to: Home              Anticipated d/c date is: 3 days              Patient currently is not medically stable to d/c.   DVT prophylaxis: apixaban   Code Status:   full  Family Communication:  No family at the bedside      Nutrition Status: Nutrition Problem: Increased nutrient needs Etiology: acute illness Signs/Symptoms: estimated needs Interventions: Glucerna shake, MVI, Prostat     Skin Documentation:     Consultants:   Pulmonary   Cardiology      Subjective: Patient this am with palpitations and worsening dyspnea, heart rate uncontrolled, no nausea or vomiting, decreased wheezing and cough,.   Objective: Vitals:   05/21/20 0430 05/21/20 0500 05/21/20 0715 05/21/20 0823  BP: 138/61 140/89 (!) 150/131 (!) 145/87  Pulse:   98 86  Resp:   (!) 28 16  Temp:   98.7 F (37.1 C) 98 F (36.7 C)  TempSrc:   Oral Oral  SpO2: 95% 97% 96% 95%  Weight:      Height:        Intake/Output Summary (Last 24 hours) at 05/21/2020 0837 Last data filed at 05/20/2020 1945 Gross per 24 hour  Intake -  Output 1650 ml  Net -1650 ml   Filed Weights   05/20/20 0255  Weight: 121 kg    Examination:   General: deconditioned and ill looking appearing  Neurology: Awake and alert, non focal  E ENT: no pallor, no icterus, oral mucosa moist Cardiovascular: No JVD. S1-S2 present, rhythmic, no gallops, rubs, or murmurs. No lower extremity edema. Pulmonary: positive breath sounds bilaterally, positive wheezing,with no rhonchi or  rales. Gastrointestinal. Abdomen soft and non tender Skin. No rashes Musculoskeletal: no joint deformities     Data Reviewed: I have personally reviewed following labs and imaging studies  CBC: Recent Labs  Lab 05/19/20 1545 05/19/20 1946 05/21/20 0216  WBC  --  8.2 11.5*  NEUTROABS  --   --  8.4*  HGB 15.6* 14.9 14.9  HCT 46.0 45.8 46.4*  MCV  --  94.4 96.5  PLT  --  215 979   Basic Metabolic Panel: Recent Labs  Lab 05/19/20 1455 05/19/20 1545 05/19/20 1946 05/20/20 0133 05/21/20 0216  NA 136 134*  --  135 142  K 3.1* 2.9*  --  3.3* 4.7  CL 88*  --   --  90* 93*  CO2 31  --   --  29 36*  GLUCOSE 168*  --   --  420* 256*  BUN 27*  --   --  29* 32*  CREATININE 1.52*  --   --  1.52* 1.53*  CALCIUM 9.2  --   --  8.6* 9.6  MG  --   --  2.1  --  2.3   GFR: Estimated Creatinine Clearance: 43.1 mL/min (A) (by C-G formula based on SCr  of 1.53 mg/dL (H)). Liver Function Tests: No results for input(s): AST, ALT, ALKPHOS, BILITOT, PROT, ALBUMIN in the last 168 hours. No results for input(s): LIPASE, AMYLASE in the last 168 hours. No results for input(s): AMMONIA in the last 168 hours. Coagulation Profile: No results for input(s): INR, PROTIME in the last 168 hours. Cardiac Enzymes: No results for input(s): CKTOTAL, CKMB, CKMBINDEX, TROPONINI in the last 168 hours. BNP (last 3 results) No results for input(s): PROBNP in the last 8760 hours. HbA1C: Recent Labs    05/19/20 1946  HGBA1C 8.1*   CBG: Recent Labs  Lab 05/20/20 0728 05/20/20 1227 05/20/20 1633 05/20/20 2112 05/21/20 0711  GLUCAP 336* 261* 166* 230* 237*   Lipid Profile: No results for input(s): CHOL, HDL, LDLCALC, TRIG, CHOLHDL, LDLDIRECT in the last 72 hours. Thyroid Function Tests: No results for input(s): TSH, T4TOTAL, FREET4, T3FREE, THYROIDAB in the last 72 hours. Anemia Panel: No results for input(s): VITAMINB12, FOLATE, FERRITIN, TIBC, IRON, RETICCTPCT in the last 72 hours.     Radiology Studies: I have reviewed all of the imaging during this hospital visit personally     Scheduled Meds: . (feeding supplement) PROSource Plus  30 mL Oral BID BM  . apixaban  5 mg Oral BID  . arformoterol  15 mcg Nebulization BID  . vitamin C  1,000 mg Oral QHS  . atorvastatin  40 mg Oral Daily  . budesonide (PULMICORT) nebulizer solution  2 mg Nebulization BID  . calcium-vitamin D  1 tablet Oral Daily  . chlorpheniramine-HYDROcodone  5 mL Oral Q12H  . diltiazem  360 mg Oral Daily  . dofetilide  250 mcg Oral BID  . DULoxetine  60 mg Oral Daily  . feeding supplement (GLUCERNA SHAKE)  237 mL Oral TID BM  . furosemide  60 mg Intravenous Q12H  . insulin aspart  0-20 Units Subcutaneous TID WC  . ipratropium-albuterol  3 mL Nebulization QID  . loratadine  10 mg Oral Daily  . metoprolol tartrate  25 mg Oral BID  . multivitamin with minerals  1 tablet Oral Daily  . pantoprazole  40 mg Oral Daily  . sodium chloride flush  3 mL Intravenous Q12H  . vitamin B-12  5,000 mcg Oral QHS  . vitamin E  1,000 Units Oral QHS   Continuous Infusions: . sodium chloride       LOS: 2 days        Kelissa Merlin Gerome Apley, MD

## 2020-05-21 NOTE — Progress Notes (Signed)
HR afib sustaining around 140.  Giving 10mg  cardizem.

## 2020-05-21 NOTE — Progress Notes (Signed)
Occupational Therapy Treatment Patient Details Name: [name] MRN: [id number] DOB: 1946/03/25 Today's Date: 05/21/2020    History of present illness 74 y.o. female with medical history significant for morbid obesity, congestive heart failure, coronary artery disease status post stent angioplasty, history of A. fib status post ablation on chronic anticoagulation therapy with Eliquis, pulmonary hypertension status post pacemaker insertion who presents to the ER on 11/17 for worsening shortness of breath. Workup for acute on chronic diastolic heart failure, RSV.   OT comments  Set up BSC for pivot from bed. Pt educated in energy conservation strategies with pt verbalizing understanding. On NRB throughout session with Sp02 >95%, RN placed pt on 15L via [place] at end of session as pt wanting to attempt to wean dependence.   Follow Up Recommendations  No OT follow up;Supervision/Assistance - 24 hour    Equipment Recommendations  None recommended by OT    Recommendations for Other Services      Precautions / Restrictions Precautions Precautions: Other (comment) Precaution Comments: on 15 L NRB Restrictions Weight Bearing Restrictions: No       Mobility Bed Mobility Overal bed mobility: Modified Independent Bed Mobility: Supine to Sit     Supine to sit: Modified independent (Device/Increase time);HOB elevated     General bed mobility comments: HOB up slightly  Transfers Overall transfer level: Needs assistance Equipment used: None Transfers: Sit to/from [facility] Sit to Stand: Supervision Stand pivot transfers: Supervision       General transfer comment: VCs hand placement    Balance Overall balance assessment: Mild deficits observed, not formally tested                                         ADL either performed or assessed with clinical judgement   ADL Overall ADL's : Needs assistance/impaired     Grooming: Set up;Sitting                    Toilet Transfer: Supervision/safety;Stand-pivot;BSC   Toileting- Clothing Manipulation and Hygiene: Minimal assistance;Sit to/from stand         General ADL Comments: Pt verbalized understanding of energy conservation strategies.     Vision       Perception     Praxis      Cognition Arousal/Alertness: Awake/alert Behavior During Therapy: WFL for tasks assessed/performed Overall Cognitive Status: Within Functional Limits for tasks assessed                                          Exercises     Shoulder Instructions       General Comments      Pertinent Vitals/ Pain       Pain Assessment: No/denies pain  Home Living Family/patient expects to be discharged to:: Private residence                                        Prior Functioning/Environment              Frequency  Min 2X/week        Progress Toward Goals  OT Goals(current goals can now be found in the care plan section)  Progress towards OT  goals: Progressing toward goals  Acute Rehab OT Goals Patient Stated Goal: to breathe better so I can walk in the parks OT Goal Formulation: With patient/family Time For Goal Achievement: 06/03/20 Potential to Achieve Goals: Good  Plan Discharge plan remains appropriate    Co-evaluation                 AM-PAC OT "6 Clicks" Daily Activity     Outcome Measure   Help from another person eating meals?: None Help from another person taking care of personal grooming?: A Little Help from another person toileting, which includes using toliet, bedpan, or urinal?: A Little Help from another person bathing (including washing, rinsing, drying)?: A Little Help from another person to put on and taking off regular upper body clothing?: A Little Help from another person to put on and taking off regular lower body clothing?: A Little 6 Click Score: 19    End of Session Equipment Utilized During  Treatment: Oxygen  OT Visit Diagnosis: Other abnormalities of gait and mobility (R26.89);Other (comment)   Activity Tolerance Patient tolerated treatment well   Patient Left in bed;with call bell/phone within reach   Nurse Communication Other (comment) (RN placed pt on 15L via [place])        Time: 1353-1430 OT Time Calculation (min): 37 min  Charges: OT General Charges $OT Visit: 1 Visit OT Treatments $Self Care/Home Management : 23-37 mins  Nestor Lewandowsky, OTR/L Acute Rehabilitation Services Pager: [phone] Office: [phone]   Malka So 05/21/2020, 2:55 PM

## 2020-05-21 NOTE — Consult Note (Addendum)
Cardiology Consultation:   Patient ID: MCKAYLIN BASTIEN MRN: [id number]; DOB: [birth year]  Admit date: 05/19/2020 Date of Consult: 05/21/2020  Primary Care Provider: [name], MD [facility] Cardiologist: Mertie Moores, MD  [facility] Electrophysiologist:  Will Meredith Leeds, MD    Patient Profile:   BRAEDEN DOLINSKI is a 74 y.o. female with a hx of chronic diastolic heart failure, CAD, Afib, SSS s/p PPM, pulmonary hypertension, obesity, HTN, HLD, OSA on CPAP, and CVA/TIA who is being seen today for the evaluation of Afib RVR at the request of Dr. Cathlean Sauer.  History of Present Illness:   Ms. Alfred has a remote history of CAD with BMS to mid RCA in 2004, chronic atrial fibrillation, OSA on CPAP, HTN, HLD. Last heart cath 05/2017 showed patent RCA stent and chronic occlusion of small acute marginal branch.  Also showed severe pulmonary hypertension.  She has a history of Afib, followed in the Afib clinic. She has a history of multiple cardioversion, most recently 02/18/20 following admission for tikosyn load.  She has a history of three prior ablations, most recently 03/14/19. She failed amiodarone for concern for ILD. She is anticoagulated with eliquis. She was just seen in clinic by Dr. Curt Bears 05/11/20 and was in atrial flutter. Dr. Curt Bears offered repeat ablation and she wanted to think about it.   Unfortunately, she presented to [facility] 05/19/20 in respiratory failure secondary to bronchitis and RSV. She was hypoxic to 82% and placed on 6L.  As a result, she has had some RVR for which cardiology was consulted. She was initially placed on cardizem gtt and has since transitioned to her home cardizem dose. Tikosyn has been continued and lopressor added. Rates are now in the 80s. She denies chest pain and has diuresed well.   Echo yesterday with preserved EF, no WMA, mild LVH and no shunt.    Past Medical History:  Diagnosis Date  . Anticoagulant long-term use    pradaxa  . Anxiety   .  Arthritis    "fingers, lower back" (04/23/2017   . CAD (coronary artery disease) [id number]   post PTCA with bare-metal stenting to mid RCA in December 2004     . CHF (congestive heart failure) ([place])   . Chronic atrial fibrillation ([place]) 06/2007   Tachybradycardia pacemaker  . Chronic kidney disease    10% function - ?R, other kidney is compensating    . CVA (cerebral vascular accident) [facility]) [phone]   denies residual on 04/23/2017  . Depression   . Diplopia 06/19/2008   Qualifier: Diagnosis of  By: [name] MD, [name]   . Dysrhythmia    ATRIAL FIBRILATION  . Edema of lower extremity   . Hyperlipidemia   . Hypertension   . Inferior myocardial infarction [facility])    acute inferior wall mi/other medical hx  . Myocardial infarction ([place]) [id number]  . Obesity   . OSA on CPAP    last test- 2010  . Pacemaker   . Pneumonia 2014   tx. ----  [facility]  . Pulmonary hypertension ([place])    moderate pulmonary hypertension by 10/2016 echo and 10/2013 cardiac cath  . Shortness of breath   . Skin cancer    "cut off right [place]; burned off LLE" (04/23/2017)  . Sleep apnea   . Spondylolisthesis   . TIA (transient ischemic attack) 2008  . Unspecified hemorrhoids without mention of complication 8/45/3646   Colonoscopy--Dr. Carlean Purl     Past Surgical History:  Procedure Laterality Date  .  ABDOMINAL HYSTERECTOMY    . APPENDECTOMY  1984  . ATRIAL FIBRILLATION ABLATION N/A 09/29/2016   Procedure: Atrial Fibrillation Ablation;  Surgeon: Will Meredith Leeds, MD;  Location: [place] CV LAB;  Service: Cardiovascular;  Laterality: N/A;  . ATRIAL FIBRILLATION ABLATION N/A 02/07/2018   Procedure: ATRIAL FIBRILLATION ABLATION;  Surgeon: Constance Haw, MD;  Location: [place] CV LAB;  Service: Cardiovascular;  Laterality: N/A;  . ATRIAL FIBRILLATION ABLATION N/A 03/13/2019   Procedure: ATRIAL FIBRILLATION ABLATION;  Surgeon: Constance Haw, MD;  Location: [place] CV LAB;  Service:  Cardiovascular;  Laterality: N/A;  . BACK SURGERY    . CARDIOVERSION N/A 09/12/2017   Procedure: CARDIOVERSION;  Surgeon: Jerline Pain, MD;  Location: [facility] ENDOSCOPY;  Service: Cardiovascular;  Laterality: N/A;  . CARDIOVERSION N/A 12/13/2017   Procedure: CARDIOVERSION;  Surgeon: Sanda Klein, MD;  Location: [place] ENDOSCOPY;  Service: Cardiovascular;  Laterality: N/A;  . CARDIOVERSION N/A 02/18/2018   Procedure: CARDIOVERSION;  Surgeon: Dorothy Spark, MD;  Location: [facility] ENDOSCOPY;  Service: Cardiovascular;  Laterality: N/A;  . CARDIOVERSION N/A 05/20/2018   Procedure: CARDIOVERSION;  Surgeon: [name], MD;  Location: [facility] ENDOSCOPY;  Service: Cardiovascular;  Laterality: N/A;  . CARDIOVERSION N/A 04/16/2019   Procedure: CARDIOVERSION;  Surgeon: Donato Heinz, MD;  Location: [place];  Service: Endoscopy;  Laterality: N/A;  . CARDIOVERSION N/A 01/22/2020   Procedure: CARDIOVERSION;  Surgeon: Geralynn Rile, MD;  Location: [place];  Service: Cardiovascular;  Laterality: N/A;  . CARDIOVERSION N/A 02/18/2020   Procedure: CARDIOVERSION;  Surgeon: Elouise Munroe, MD;  Location: [facility] ENDOSCOPY;  Service: Cardiovascular;  Laterality: N/A;  . CATARACT EXTRACTION W/ INTRAOCULAR LENS  IMPLANT, BILATERAL Bilateral 01/15/2017- 03/2017  . CHOLECYSTECTOMY    . CORONARY ANGIOPLASTY  X 2  . CORONARY ANGIOPLASTY WITH STENT PLACEMENT  1998; ~ 2007; ?date   "1 stent; replaced stent; not sure when I got the last stent" (04/23/2017)  . DOPPLER ECHOCARDIOGRAPHY  2009  . ELECTROPHYSIOLOGIC STUDY N/A 03/31/2016   Procedure: Cardioversion;  Surgeon: Evans Lance, MD;  Location: [place] CV LAB;  Service: Cardiovascular;  Laterality: N/A;  . ELECTROPHYSIOLOGIC STUDY N/A 08/04/2016   Procedure: Cardioversion;  Surgeon: Evans Lance, MD;  Location: [place] CV LAB;  Service: Cardiovascular;  Laterality: N/A;  . INSERT / REPLACE / REMOVE PACEMAKER  06/2007  . IR RADIOLOGY  PERIPHERAL GUIDED IV START  01/31/2018  . IR US GUIDE VASC ACCESS LEFT  01/31/2018  . JOINT REPLACEMENT    . LAPAROSCOPIC CHOLECYSTECTOMY  1994  . LEFT AND RIGHT HEART CATHETERIZATION WITH CORONARY ANGIOGRAM N/A 10/06/2013   Procedure: LEFT AND RIGHT HEART CATHETERIZATION WITH CORONARY ANGIOGRAM;  Surgeon: Troy Sine, MD;  Location: [facility] CATH LAB;  Service: Cardiovascular;  Laterality: N/A;  . LEFT OOPHORECTOMY Left ~ 1989  . POSTERIOR LUMBAR FUSION  2000s - 04/2015 X 3   L3-4; L4-5; L2-3; Dr Trenton Gammon  . RIGHT/LEFT HEART CATH AND CORONARY ANGIOGRAPHY N/A 05/09/2017   Procedure: RIGHT/LEFT HEART CATH AND CORONARY ANGIOGRAPHY;  Surgeon: Sherren Mocha, MD;  Location: [place] CV LAB;  Service: Cardiovascular;  Laterality: N/A;  . SKIN CANCER EXCISION Right    [place]  . TEE WITHOUT CARDIOVERSION N/A 09/29/2016   Procedure: TRANSESOPHAGEAL ECHOCARDIOGRAM (TEE);  Surgeon: Jerline Pain, MD;  Location: [place];  Service: Cardiovascular;  Laterality: N/A;  . TOTAL ABDOMINAL HYSTERECTOMY  1984   "uterus & right ovary"  . TOTAL KNEE ARTHROPLASTY Left 04/23/2017  .  TOTAL KNEE ARTHROPLASTY Left 04/23/2017   Procedure: TOTAL KNEE ARTHROPLASTY;  Surgeon: Frederik Pear, MD;  Location: [place];  Service: Orthopedics;  Laterality: Left;  . ULTRASOUND GUIDANCE FOR VASCULAR ACCESS  05/09/2017   Procedure: Ultrasound Guidance For Vascular Access;  Surgeon: Sherren Mocha, MD;  Location: [place] CV LAB;  Service: Cardiovascular;;     Home Medications:  Prior to Admission medications   Medication Sig Start Date End Date Taking? Authorizing Provider  Ascorbic Acid (VITAMIN C) 1000 MG tablet Take 1,000 mg by mouth at bedtime.    Yes [provider]  atorvastatin (LIPITOR) 40 MG tablet TAKE 1 TABLET BY MOUTH EVERY DAY Patient taking differently: Take 40 mg by mouth daily.  09/16/19  Yes Camnitz, Ocie Doyne, MD  Calcium Carbonate-Vitamin D (CALCIUM 600+D PO) Take 1 tablet by mouth daily.    Yes  [provider]  cefdinir (OMNICEF) 300 MG capsule Take 1 capsule (300 mg total) by mouth 2 (two) times daily. 05/17/20  Yes [name], [name], MD  Cholecalciferol (VITAMIN D3) 125 MCG (5000 UT) TABS Take 5,000 Units by mouth at bedtime.    Yes [provider]  Cyanocobalamin (B-12) 5000 MCG CAPS Take 5,000 mcg by mouth at bedtime.    Yes [provider]  diltiazem (CARDIZEM LA) 360 MG 24 hr tablet Take 1 tablet (360 mg total) by mouth daily. 03/30/20  Yes Camnitz, Will Hassell Done, MD  dofetilide (TIKOSYN) 250 MCG capsule Take 1 capsule (250 mcg total) by mouth 2 (two) times daily. 03/01/20  Yes Sherran Needs, NP  DULoxetine (CYMBALTA) 60 MG capsule TAKE 1 CAPSULE BY MOUTH EVERY DAY Patient taking differently: Take 60 mg by mouth daily.  04/26/20  Yes [name], [name], MD  ELIQUIS 5 MG TABS tablet TAKE 1 TABLET BY MOUTH TWICE A DAY Patient taking differently: Take 5 mg by mouth 2 (two) times daily.  04/28/20  Yes Camnitz, Will Hassell Done, MD  fluticasone (FLONASE) 50 MCG/ACT nasal spray Place 1 spray into both nostrils at bedtime as needed for allergies.    Yes [provider]  loratadine (CLARITIN) 10 MG tablet TAKE 1 TABLET BY MOUTH EVERY DAY Patient taking differently: Take 10 mg by mouth daily.  04/07/20  Yes [name], [name], MD  Lysine 1000 MG TABS Take 1,000 mg by mouth at bedtime.    Yes [provider]  metolazone (ZAROXOLYN) 2.5 MG tablet Take 1 tablet (2.5 mg total) by mouth every 4 days AS NEEDED for swelling Patient taking differently: Take 2.5 mg by mouth See admin instructions. Take 1 tablet (2.5 mg total) by mouth every 4 days AS NEEDED for swelling 05/11/20  Yes Camnitz, Will Hassell Done, MD  Multiple Vitamin (MULTIVITAMIN WITH MINERALS) TABS tablet Take 1 tablet by mouth daily. Centrum   Yes [provider]  nitroGLYCERIN (NITROSTAT) 0.4 MG SL tablet PLACE 1 TABLET (0.4 MG TOTAL) UNDER THE TONGUE EVERY 5 (FIVE) MINUTES X 3 DOSES AS NEEDED FOR  CHEST PAIN. 05/17/20  Yes Nahser, Wonda Cheng, MD  nystatin-triamcinolone (MYCOLOG II) cream Apply 1 application topically 2 (two) times daily as needed. Patient taking differently: Apply 1 application topically 2 (two) times daily as needed (rash).  01/28/20  Yes [name], [name], MD  omeprazole (PRILOSEC) 20 MG capsule Take 20 mg by mouth daily before breakfast.    Yes [provider]  Polyethyl Glycol-Propyl Glycol (SYSTANE) 0.4-0.3 % SOLN Place 1 drop into both eyes 2 (two) times daily as needed (dry/irritated eyes.).  Yes [provider]  potassium chloride SA (KLOR-CON M20) 20 MEQ tablet Take 1 tablet (20 mEq total) by mouth 2 (two) times daily. Please make yearly appt with Dr. Acie Fredrickson for January 2022 for future refills. Thank you 1st attempt 05/04/20  Yes Nahser, Wonda Cheng, MD  torsemide (DEMADEX) 20 MG tablet Take 2 tablets (40 mg total) by mouth 2 (two) times daily. 07/11/19  Yes Nahser, Wonda Cheng, MD  valACYclovir (VALTREX) 1000 MG tablet Take 2 tablets (2,000 mg total) by mouth every 12 (twelve) hours as needed (fever blisters/cold sores.). 10/17/19  Yes [name], [name], MD  vitamin E 1000 UNIT capsule Take 1,000 Units by mouth at bedtime.    Yes [provider]    Inpatient Medications: Scheduled Meds: . (feeding supplement) PROSource Plus  30 mL Oral BID BM  . apixaban  5 mg Oral BID  . arformoterol  15 mcg Nebulization BID  . vitamin C  1,000 mg Oral QHS  . atorvastatin  40 mg Oral Daily  . budesonide (PULMICORT) nebulizer solution  2 mg Nebulization BID  . calcium-vitamin D  1 tablet Oral Daily  . chlorpheniramine-HYDROcodone  5 mL Oral Q12H  . diltiazem  360 mg Oral Daily  . dofetilide  250 mcg Oral BID  . DULoxetine  60 mg Oral Daily  . feeding supplement (GLUCERNA SHAKE)  237 mL Oral TID BM  . furosemide  80 mg Intravenous Q12H  . insulin aspart  0-20 Units Subcutaneous TID WC  . insulin glargine  10 Units Subcutaneous Daily  . loratadine  10 mg Oral  Daily  . metoprolol tartrate  25 mg Oral BID  . multivitamin with minerals  1 tablet Oral Daily  . pantoprazole  40 mg Oral Daily  . sodium chloride flush  3 mL Intravenous Q12H  . vitamin B-12  5,000 mcg Oral QHS  . Vitamin E  800 Units Oral QHS   And  . vitamin E  200 Units Oral QHS   Continuous Infusions: . sodium chloride     PRN Meds: sodium chloride, acetaminophen, fluticasone, guaiFENesin-dextromethorphan, ipratropium-albuterol, nitroGLYCERIN, ondansetron (ZOFRAN) IV, phenol, polyvinyl alcohol, sodium chloride flush  Allergies:    Allergies  Allergen Reactions  . Adhesive [Tape] Rash and Other (See Comments)    Allergic to defibrillation pads.    Social History:   Social History   Socioeconomic History  . Marital status: Married    Spouse name: Not on file  . Number of children: 0  . Years of education: HS  . Highest education level: Not on file  Occupational History  . Occupation: retired    Comment: previously worked [employer]  . Smoking status: Former Smoker    Packs/day: 1.00    Years: 5.00    Pack years: 5.00    Types: Cigarettes    Start date: 05/11/1978    Quit date: 07/03/1982    Years since quitting: 37.9  . Smokeless tobacco: Never Used  Vaping Use  . Vaping Use: Never used  Substance and Sexual Activity  . Alcohol use: Yes    Comment: 04/23/2017 "once q 6 months; glass of wine"  . Drug use: No  . Sexual activity: Not Currently  Other Topics Concern  . 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   Married    Originally from [place] [place] of two, high school education   Former smoker [phone]  1ppd   Hunting dogs 7    Retired from [place] [employer] 2    Social Determinants of Radio broadcast assistant Strain:   . Difficulty of Paying Living Expenses: Not on file  Food Insecurity:   . Worried About Charity fundraiser in the Last Year:  Not on file  . Ran Out of Food in the Last Year: Not on file  Transportation Needs: No Transportation Needs  . Lack of Transportation (Medical): No  . Lack of Transportation (Non-Medical): No  Physical Activity:   . Days of Exercise per Week: Not on file  . Minutes of Exercise per Session: Not on file  Stress:   . Feeling of Stress : Not on file  Social Connections:   . Frequency of Communication with Friends and Family: Not on file  . Frequency of Social Gatherings with Friends and Family: Not on file  . Attends Religious Services: Not on file  . Active Member of Clubs or Organizations: Not on file  . Attends Archivist Meetings: Not on file  . Marital Status: Not on file  Intimate Partner Violence:   . Fear of Current or Ex-Partner: Not on file  . Emotionally Abused: Not on file  . Physically Abused: Not on file  . Sexually Abused: Not on file    Family History:    Family History  Problem Relation Age of Onset  . Suicidality Father        suicide death pt was 3 yrs, 38  . Arrhythmia Mother   . Hypertension Mother   . Diabetes Mother   . Dementia Mother   . Heart attack Brother   . Heart disease Paternal Aunt   . Prostate cancer Maternal Grandfather   . Diabetes Paternal Grandfather        fathers side of the family  . Colon cancer Maternal Aunt      ROS:  Please see the history of present illness.   All other ROS reviewed and negative.     Physical Exam/Data:   Vitals:   05/21/20 1238 05/21/20 1239 05/21/20 1242 05/21/20 1445  BP: 127/66 127/66    Pulse: 76   76  Resp: (!) 29  18   Temp: 98.5 F (36.9 C) 98.5 F (36.9 C)    TempSrc: Oral Oral    SpO2: 94% 96%  94%  Weight:      Height:        Intake/Output Summary (Last 24 hours) at 05/21/2020 1504 Last data filed at 05/21/2020 1400 Gross per 24 hour  Intake 600 ml  Output 950 ml  Net -350 ml   Last 3 Weights 05/20/2020 05/11/2020 03/30/2020  Weight (lbs) 266 lb 12.1 oz 268 lb 271 lb    Weight (kg) 121 kg 121.564 kg 122.925 kg     Body mass index is 42.41 kg/m.  General:  Obese female in NAD HEENT: normal Lymph: no adenopathy Neck: no JVD Endocrine:  No thryomegaly Vascular: No carotid bruits  Cardiac:  Irregular rhythm, regular rate Lungs:  clear to auscultation bilaterally, no wheezing, rhonchi or rales  Abd: soft, nontender, no hepatomegaly  Ext: no edema Musculoskeletal:  No deformities, BUE and BLE strength normal and equal Skin: warm and dry  Neuro:  CNs 2-12 intact, no focal abnormalities noted Psych:  Normal affect   EKG:  The EKG was personally reviewed and demonstrates:  Atrial flutter with ventricular rate 122 Telemetry:  Telemetry was personally reviewed and demonstrates:  Atrial flutter with rates now in the 80s  Relevant CV Studies:  LHC/RHC 05/09/17 1. Single-vessel coronary artery disease with continued patency of the stented segment in the right coronary artery and chronic occlusion of a small acute marginal branch collateralized by the left coronary artery 2. Patent left main, LAD, and left circumflex without significant stenosis 3. Vigorous LV systolic function with mildly elevated LVEDP 4. Severe pulmonary hypertension with mean pulmonary artery pressure of 48 mmHg. Because of high cardiac output (8.5 L/min), the patient's pulmonary vascular resistance is only mildly increased at 3 Wood units.  Laboratory Data:  High Sensitivity Troponin:   Recent Labs  Lab 05/19/20 1455 05/19/20 2323 05/20/20 0450  TROPONINIHS 20* 27* 28*     Chemistry Recent Labs  Lab 05/19/20 1455 05/19/20 1455 05/19/20 1545 05/20/20 0133 05/21/20 0216  NA 136   < > 134* 135 142  K 3.1*   < > 2.9* 3.3* 4.7  CL 88*  --   --  90* 93*  CO2 31  --   --  29 36*  GLUCOSE 168*  --   --  420* 256*  BUN 27*  --   --  29* 32*  CREATININE 1.52*  --   --  1.52* 1.53*  CALCIUM 9.2  --   --  8.6* 9.6  GFRNONAA 36*  --   --  36* 35*  ANIONGAP 17*  --   --  16*  13   < > = values in this interval not displayed.    No results for input(s): PROT, ALBUMIN, AST, ALT, ALKPHOS, BILITOT in the last 168 hours. Hematology Recent Labs  Lab 05/19/20 1545 05/19/20 1946 05/21/20 0216  WBC  --  8.2 11.5*  RBC  --  4.85 4.81  HGB 15.6* 14.9 14.9  HCT 46.0 45.8 46.4*  MCV  --  94.4 96.5  MCH  --  30.7 31.0  MCHC  --  32.5 32.1  RDW  --  13.3 13.4  PLT  --  215 209   BNP Recent Labs  Lab 05/19/20 1946  BNP 51.8    DDimer No results for input(s): DDIMER in the last 168 hours.   Radiology/Studies:  DG Chest 2 View  Result Date: 05/19/2020 CLINICAL DATA:  Pt having SOB, cough and congestion since last Friday - hx of AFIB, CHF, pacemaker, CAD, sleep apnea, TIA, MI EXAM: CHEST - 2 VIEW COMPARISON:  05/31/2018 FINDINGS: Cardiac silhouette is normal in size. No mediastinal or hilar masses. No evidence of adenopathy. Stable left anterior chest wall sequential pacemaker. Mild chronic prominence of the interstitial and vascular markings. Lungs mildly hyperexpanded but otherwise clear. No pleural effusion or pneumothorax. Skeletal structures are intact. IMPRESSION: 1. No acute cardiopulmonary disease. 2. Chronic lung hyperexpansion with mild prominence of the interstitial and vascular markings, without pulmonary edema. Electronically Signed   By: Lajean Manes M.D.   On: 05/19/2020 11:35   CT Chest Wo Contrast  Result Date: 05/19/2020 CLINICAL DATA:  Chest pain EXAM: CT CHEST WITHOUT CONTRAST TECHNIQUE: Multidetector CT imaging of the chest was performed following the standard protocol without IV contrast. COMPARISON:  05/31/2018 FINDINGS: Cardiovascular: Aortic atherosclerosis and coronary artery calcifications diffusely, most pronounced in the right coronary artery. Pacer wires noted in the right heart. Heart is normal size. Aorta normal caliber. Mediastinum/Nodes: Mediastinal and probable bilateral hilar adenopathy. Index pretracheal lymph node has a short axis  diameter of 17 mm. Difficult to  measure the hilar lymph nodes due to lack of IV contrast. Lungs/Pleura: Peribronchial thickening noted bilaterally. Areas of atelectasis. Ground-glass airspace opacities in the upper lobes and superior segment of the lower lobes bilaterally, right greater than left. No effusions. Upper Abdomen: Diffuse fatty infiltration of the liver. Prior cholecystectomy. Musculoskeletal: Chest wall soft tissues are unremarkable. No acute bony abnormality. IMPRESSION: Mediastinal and probable bilateral hilar adenopathy (difficult to visualize without intravenous contrast). This is unchanged since 2019. Peribronchial thickening and areas of atelectasis and ground-glass opacities throughout the upper lobes. Findings could reflect bronchitis. Cannot exclude early bronchopneumonia, particularly in the right upper lobe and superior segment of the right lower lobe. Coronary artery disease. Aortic Atherosclerosis (ICD10-I70.0). Electronically Signed   By: Rolm Baptise M.D.   On: 05/19/2020 17:53   ECHOCARDIOGRAM COMPLETE BUBBLE STUDY  Result Date: 05/20/2020    ECHOCARDIOGRAM REPORT   Patient Name:   SHERRITA RIEDERER Date of Exam: 05/20/2020 Medical Rec #:  [id number]    Height:       66.5 in Accession #:    [id number]   Weight:       266.8 lb Date of Birth:  1945/10/30     BSA:          2.273 m Patient Age:    34 years     BP:           116/54 mmHg Patient Gender: F            HR:           67 bpm. Exam Location:  Inpatient Procedure: 2D Echo, Cardiac Doppler, Color Doppler and Saline Contrast Bubble            Study Indications:    Q21.1 Patient foramen [place]  History:        Patient has prior history of Echocardiogram examinations, most                 recent 06/01/2018. CHF, CAD and Previous Myocardial Infarction,                 Pacemaker, Stroke, Pulmonary HTN and TIA, Arrythmias:Atrial                 Fibrillation, Signs/Symptoms:Dyspnea; Risk Factors:Hypertension,                 Dyslipidemia and  Sleep Apnea. CKD.  Sonographer:    Jonelle Sidle Dance Referring Phys: [place]  1. Left ventricular ejection fraction, by estimation, is 60 to 65%. The left ventricle has normal function. The left ventricle has no regional wall motion abnormalities. There is mild left ventricular hypertrophy. Left ventricular diastolic parameters are indeterminate.  2. Right ventricular systolic function is normal. The right ventricular size is normal. Tricuspid regurgitation signal is inadequate for assessing PA pressure.  3. The mitral valve is abnormal. No evidence of mitral valve regurgitation. Moderate mitral annular calcification.  4. The aortic valve was not well visualized. Aortic valve regurgitation is not visualized. No aortic stenosis is present.  5. Agitated saline contrast bubble study was negative, with no evidence of any interatrial shunt, though poor quality bubble study FINDINGS  Left Ventricle: Left ventricular ejection fraction, by estimation, is 60 to 65%. The left ventricle has normal function. The left ventricle has no regional wall motion abnormalities. The left ventricular internal cavity size was normal in size. There is  mild left ventricular hypertrophy. Left ventricular diastolic parameters are indeterminate. Right Ventricle: The right ventricular size  is normal. Right vetricular wall thickness was not assessed. Right ventricular systolic function is normal. Tricuspid regurgitation signal is inadequate for assessing PA pressure. Left Atrium: Left atrial size was normal in size. Right Atrium: Right atrial size was normal in size. Pericardium: There is no evidence of pericardial effusion. Mitral Valve: The mitral valve is abnormal. Moderate mitral annular calcification. No evidence of mitral valve regurgitation. Tricuspid Valve: The tricuspid valve is normal in structure. Tricuspid valve regurgitation is trivial. Aortic Valve: The aortic valve was not well visualized. Aortic valve  regurgitation is not visualized. No aortic stenosis is present. Pulmonic Valve: The pulmonic valve was not well visualized. Pulmonic valve regurgitation is not visualized. Aorta: The aortic root and ascending aorta are structurally normal, with no evidence of dilitation. IAS/Shunts: The interatrial septum was not well visualized. Agitated saline contrast was given intravenously to evaluate for intracardiac shunting. Agitated saline contrast bubble study was negative, with no evidence of any interatrial shunt.  LEFT VENTRICLE PLAX 2D LVIDd:         4.30 cm LVIDs:         3.00 cm LV PW:         1.00 cm LV IVS:        1.20 cm LVOT diam:     1.70 cm LV SV:         57 LV SV Index:   25 LVOT Area:     2.27 cm  RIGHT VENTRICLE          IVC RV Basal diam:  2.80 cm  IVC diam: 1.85 cm TAPSE (M-mode): 1.5 cm LEFT ATRIUM             Index       RIGHT ATRIUM           Index LA diam:        3.90 cm 1.72 cm/m  RA Area:     15.30 cm LA Vol (A2C):   87.1 ml 38.32 ml/m RA Volume:   35.60 ml  15.66 ml/m LA Vol (A4C):   47.5 ml 20.90 ml/m LA Biplane Vol: 65.8 ml 28.95 ml/m  AORTIC VALVE LVOT Vmax:   130.50 cm/s LVOT Vmean:  91.100 cm/s LVOT VTI:    0.250 m  AORTA Ao Root diam: 3.30 cm Ao Asc diam:  3.40 cm MITRAL VALVE MV Area (PHT): 2.90 cm     SHUNTS MV Decel Time: 262 msec     Systemic VTI:  0.25 m MV E velocity: 134.50 cm/s  Systemic Diam: 1.70 cm Oswaldo Milian MD Electronically signed by Oswaldo Milian MD Signature Date/Time: 05/20/2020/10:04:29 PM    Final      Assessment and Plan:   Persistent atrial fibrillation Atrial flutter RVR - patient has history of multiple ablations and cardioversions - continue cardizem and tikosyn - lopressor 25 mg BID has been added with resolution in her RVR - rates in the 80s - titrate lopressor as needed - do not suspect she will need BB long term, RVR should resolve has her respiratory illness improves - has already tentatively planned for an ablation in  Jan   Chronic anticoagulation - continue eliquis 5 mg BID This patients CHA2DS2-VASc Score and unadjusted Ischemic Stroke Rate (% per year) is equal to 4.8 % stroke rate/year from a score of 4 (age, female, HTN, CHF)   Sinus node dysfunction - PPM in place   Respiratory failure secondary to RSV bronchitis Chronic diastolic heart failure - 80 mg IV lasix BID  has been ordered - do not suspect CHF is driving her respiratory status and is unlikely to need continued high dose lasix - renal function is reduced - sCr 1.53 - is overall net negative 2 L with 1.6 L urine output - BNP 52 on admission - I will D/C lasix   Hypertension - pressures are controlled, room to increase BB if needed   Hyperlipidemia - continue statin   OSA on CPAP - continue    New York Heart Association (NYHA) Functional Class NYHA Class II   CHA2DS2-VASc Score = 4  This indicates a 4.8% annual risk of stroke. The patient's score is based upon: CHF History: 1 HTN History: 1 Diabetes History: 0 Stroke History: 0 Vascular Disease History: 0 Age Score: 1 Gender Score: 1         For questions or updates, please contact [place] Please consult [email or web address] for contact info under    Signed, Ledora Bottcher, [place]  05/21/2020 3:04 PM   Patient seen and examined and agree with Fabian Sharp, PA.  In brief, patient is a 73 year old female with hx of chronic diastolic heart failure, CAD, Afib, SSS s/p PPM, pulmonary hypertension, obesity, HTN, HLD, OSA on CPAP, and CVA/TIA who presented with worsening SOB found to have RSV with course complicated by Afib with RVR.  Rates much better controlled on my evaluation on tikosyn, cardizem, and lopressor with HR 80s. On apixaban for [facility]. Planned for ablation in Jan.  Exam: GEN: Laying comfortably in bed, O2 in place   Neck: No JVD Cardiac: Irregularly irregular, no murmurs, rubs, or gallops.  Respiratory: Faint wheezing. Good air movement GI:  Obese, soft, ND, NTTP MS: No edema; No deformity. Neuro:  Nonfocal  Psych: Normal affect    Plan: -Continue metop, tikosyn and cardizem for rate control -Continue apixaban 5mg  BID -Change back to PO lasix tomorrow  -Planned for ablation for Jan  -Management of RSV per primary team  Gwyndolyn Kaufman, MD

## 2020-05-21 NOTE — Progress Notes (Signed)
Patient refuses use of CPAP for the evening.

## 2020-05-21 NOTE — TOC Initial Note (Addendum)
Transition of Care [facility]) - Initial/Assessment Note    Patient Details  Name: [name] MRN: [id number] Date of Birth: 1945/08/30  Transition of Care [facility]) CM/SW Contact:    Angelita Ingles, RN Phone Number: [phone]  05/21/2020, 10:19 AM  Clinical Narrative:                 TOC consulted to see patient in need of home health heart failure screening. Patient reports that she lives at home with spouse and is able to manage independently. Cm at bedside to make patient aware of the referral and patient states that MD has not discussed this with her and that she has been managing her heart failure since 1984 and she does not feel that she needs any home health or teaching. Patient states that she would like to discuss this with her MD before agreeing to allow CM to set up. Message has been sent to MD. [facility] will continue to follow.     Barriers to Discharge: Continued Medical Work up   Patient Goals and CMS Choice Patient states their goals for this hospitalization and ongoing recovery are:: Wants to get better to go home CMS Medicare.gov Compare Post Acute Care list provided to:: Patient (list offered but refused) Choice offered to / list presented to : Patient (list offered but refused)  Expected Discharge Plan and Services   In-house Referral: NA Discharge Planning Services: CM Consult   Living arrangements for the past 2 months: Single Family Home                 DME Arranged: N/A DME Agency: NA       HH Arranged: NA [place] Agency: NA        Prior Living Arrangements/Services Living arrangements for the past 2 months: Single Family Home Lives with:: Spouse Patient language and need for interpreter reviewed:: Yes Do you feel safe going back to the place where you live?: Yes      Need for Family Participation in Patient Care: Yes (Comment) Care giver support system in place?: Yes (comment)   Criminal Activity/Legal Involvement Pertinent to Current  Situation/Hospitalization: No - Comment as needed  Activities of Daily Living Home Assistive Devices/Equipment: CPAP ADL Screening (condition at time of admission) Patient's cognitive ability adequate to safely complete daily activities?: Yes Is the patient deaf or have difficulty hearing?: No Does the patient have difficulty seeing, even when wearing glasses/contacts?: No Does the patient have difficulty concentrating, remembering, or making decisions?: No Patient able to express need for assistance with ADLs?: Yes Does the patient have difficulty dressing or bathing?: No Independently performs ADLs?: Yes (appropriate for developmental age) Does the patient have difficulty walking or climbing stairs?: No Weakness of Legs: None Weakness of Arms/Hands: None  Permission Sought/Granted   Permission granted to share information with : No              Emotional Assessment Appearance:: Appears stated age Attitude/Demeanor/Rapport: Gracious Affect (typically observed): Pleasant Orientation: : Oriented to Self, Oriented to Place, Oriented to  Time, Oriented to Situation Alcohol / Substance Use: Not Applicable Psych Involvement: No (comment)  Admission diagnosis:  Acute respiratory failure ([place]) [J96.00] Patient Active Problem List   Diagnosis Date Noted  . Acute respiratory failure ([place]) 05/19/2020  . Hypokalemia 05/19/2020  . Secondary hypercoagulable state ([place]) 02/16/2020  . CHF exacerbation ([place]) 05/31/2018  . Bronchitis 05/31/2018  . Primary osteoarthritis of left knee 04/23/2017  . Degenerative arthritis of left knee 04/19/2017  .  Atypical atrial flutter ([place]) 09/30/2016  . Hematoma 09/30/2016  . Hypotension 09/30/2016  . Persistent atrial fibrillation ([place]) 09/29/2016  . Hyperlipidemia   . HLD (hyperlipidemia)   . Controlled type 2 diabetes mellitus without complication ([place])   . Acute respiratory failure with hypoxia ([place]) 03/26/2016  . DDD (degenerative disc  disease), lumbar 04/05/2015  . Degenerative disc disease, lumbar 04/05/2015  . Severe obesity (BMI >= 40) ([place]) 02/16/2015  . Fatty liver disease, nonalcoholic 03/47/4259  . Cough, persistent 04/03/2014  . Acute on chronic diastolic congestive heart failure ([place]) 10/07/2013  . Pre-diabetes 09/24/2013  . Peripheral edema 08/05/2013  . Neoplasm of uncertain behavior of skin of back 11/09/2012  . Edema 11/04/2012  . Bleeding mole 11/04/2012  . Pulmonary hypertension ([place]) 06/11/2012  . Pneumonia 06/05/2012  . Hypoxia 06/05/2012  . Anemia 06/05/2012  . Hemorrhoids 05/11/2012  . Dyspepsia 05/11/2012  . Medication management 04/20/2011  . Positional vertigo 10/07/2010  . Anticoagulant long-term use   . HERPES LABIALIS 12/30/2009  . INTERTRIGO 12/30/2009  . Obesity 06/02/2009  . Obstructive sleep apnea 10/12/2008  . HYPERLIPIDEMIA 09/15/2008  . SNORING 09/15/2008  . Essential hypertension 06/19/2008  . CAD S/P percutaneous coronary angioplasty 06/19/2008  . Paroxysmal atrial fibrillation ([place]) 06/19/2008  . BRADYCARDIA-TACHYCARDIA SYNDROME 06/19/2008  . CEREBRAL ANEURYSM 06/19/2008  . Allergic rhinitis 06/19/2008  . HYPERGLYCEMIA 06/19/2008  . DEPRESSION, HX OF 06/19/2008  . CEREBROVASCULAR ACCIDENT, HX OF 06/19/2008  . PACEMAKER, PERMANENT 06/19/2008   PCP:  [name], MD Pharmacy:   [id number] [place], [place] [address] Phone: [phone] Fax: [phone]  [facility] [place] [place] [facility] [address] [place] [address] Phone: [phone] Fax: [place], [place] [address] [place] [address] Phone: [phone] Fax: [phone]     Social Determinants of Health ([place]) Interventions    Readmission Risk Interventions No flowsheet data found.

## 2020-05-21 NOTE — Progress Notes (Signed)
Does not want home unit on at this time.

## 2020-05-21 NOTE — Progress Notes (Signed)
Transient elevation of lactic acid due to hypoperfusion. No signs of sepsis.

## 2020-05-21 NOTE — Progress Notes (Signed)
Physical Therapy Treatment Patient Details Name: [name] MRN: [id number] DOB: 1945/09/30 Today's Date: 05/21/2020    History of Present Illness 74 y.o. female with medical history significant for morbid obesity, congestive heart failure, coronary artery disease status post stent angioplasty, history of A. fib status post ablation on chronic anticoagulation therapy with Eliquis, pulmonary hypertension status post pacemaker insertion who presents to the ER on 11/17 for worsening shortness of breath. Workup for acute on chronic diastolic heart failure, RSV.    PT Comments    Noted pt had tachycardia this morning, RN stated pt is safe to ambulate. Pt ambulated 200' with wide RW and 15L NRB mask, HR 97 max, RR 33 max, SaO2 94% walking. Distance limited by fatigue.   Follow Up Recommendations  No PT follow up;Supervision for mobility/OOB     Equipment Recommendations  None recommended by PT    Recommendations for Other Services       Precautions / Restrictions Precautions Precautions: Other (comment) Precaution Comments: watch O2 and HR Restrictions Weight Bearing Restrictions: No    Mobility  Bed Mobility Overal bed mobility: Modified Independent Bed Mobility: Supine to Sit     Supine to sit: Modified independent (Device/Increase time);HOB elevated     General bed mobility comments: used rail  Transfers Overall transfer level: Needs assistance Equipment used: Rolling walker (2 wheeled) Transfers: Sit to/from Stand Sit to Stand: Supervision         General transfer comment: VCs hand placement  Ambulation/Gait Ambulation/Gait assistance: Supervision Gait Distance (Feet): 200 Feet Assistive device: Rolling walker (2 wheeled) Gait Pattern/deviations: Step-through pattern;Decreased stride length Gait velocity: decr   General Gait Details: supervision for safety, increased time and standing rest breaks as needed to recover DOE 2/4, RRmax 33 breaths/min. HRmax 97  bpm, SaO2 94% on 15L NRB   Stairs             Wheelchair Mobility    Modified Rankin (Stroke Patients Only)       Balance Overall balance assessment: Mild deficits observed, not formally tested                                          Cognition Arousal/Alertness: Awake/alert Behavior During Therapy: WFL for tasks assessed/performed Overall Cognitive Status: Within Functional Limits for tasks assessed                                        Exercises      General Comments        Pertinent Vitals/Pain Pain Assessment: No/denies pain    Home Living                      Prior Function            PT Goals (current goals can now be found in the care plan section) Acute Rehab PT Goals Patient Stated Goal: to breathe better so I can walk in the parks PT Goal Formulation: With patient Time For Goal Achievement: 06/03/20 Potential to Achieve Goals: Good Progress towards PT goals: Progressing toward goals    Frequency    Min 3X/week      PT Plan Current plan remains appropriate    Co-evaluation  AM-PAC PT "6 Clicks" Mobility   Outcome Measure  Help needed turning from your back to your side while in a flat bed without using bedrails?: A Little Help needed moving from lying on your back to sitting on the side of a flat bed without using bedrails?: A Little Help needed moving to and from a bed to a chair (including a wheelchair)?: A Little Help needed standing up from a chair using your arms (e.g., wheelchair or bedside chair)?: A Little Help needed to walk in hospital room?: A Little Help needed climbing 3-5 steps with a railing? : A Little 6 Click Score: 18    End of Session Equipment Utilized During Treatment: Oxygen Activity Tolerance: Patient tolerated treatment well Patient left: with call bell/phone within reach;in chair;with nursing/sitter in room Nurse Communication: Mobility  status PT Visit Diagnosis: Other abnormalities of gait and mobility (R26.89);Difficulty in walking, not elsewhere classified (R26.2)     Time: 1017-5102 PT Time Calculation (min) (ACUTE ONLY): 22 min  Charges:  $Gait Training: 8-22 mins                  Blondell Reveal Kistler PT 05/21/2020  Acute Rehabilitation Services Pager [phone] Office [phone]

## 2020-05-21 NOTE — Progress Notes (Signed)
[name], MRN:  [id number], DOB:  May 19, 1946, LOS: 2 ADMISSION DATE:  05/19/2020, CONSULTATION DATE:  05/19/2020  REFERRING MD:  TRH MD, CHIEF COMPLAINT:  Acute resp failure   Brief History   [name] is a 74 year old woman with atrial fibrillation, type 2 diabetes, hypertension, OSA on CPAP, HFpEF with mild restrictive lung disease and mild diffusion defect who presented to [facility] on 05/19/20 with progressive shortness of breath and has been admitted for acute on chronic hypxemic respiratory failure. She is found to have positive RSV on respiratory pathogen panel.   Significant Hospital Events   05/19/2020 - admit  Consults:  05/19/2020 - pulm  Procedures:    Significant Diagnostic Tests:  CT Chest 05/19/20 Mediastinal and probable bilateral hilar adenopathy (difficult to visualize without intravenous contrast). This is unchanged since 2019.  Peribronchial thickening and areas of atelectasis and ground-glass opacities throughout the upper lobes. Findings could reflect bronchitis. Cannot exclude early bronchopneumonia, particularly in the right upper lobe and superior segment of the right lower lobe.  Micro Data:  cpvid pcr - 05/19/2020  neg Flu pcr 05/19/2020 neg Urine strep 11/17 Urine leg 11/17 Multiplex Resp PCR 11/17 - Postive for RSV   Antimicrobials:   ceftriaxine 11/17 Doxy 11/17 azithro 11/1   Interim history/subjective:  Patient remains on NRB mask, O2 being weaned down. She is in a. Fib with RVR and complains of feeling light headed and dizzy. No nausea or chest pain. She reports her breathing is feeling better.  Objective   Blood pressure 134/90, pulse 86, temperature 98 F (36.7 C), temperature source Oral, resp. rate 16, height 5' 6.5" (1.689 m), weight 121 kg, SpO2 95 %.    FiO2 (%):  [100 %] 100 %   Intake/Output Summary (Last 24 hours) at 05/21/2020 0943 Last data filed at 05/20/2020 1945 Gross per 24 hour  Intake --  Output 1650 ml   Net -1650 ml   Filed Weights   05/20/20 0255  Weight: 121 kg    Examination: General: obese, no distress. NRB mask in place HENT: sclera anicteric, moist mucous membranes Lungs: End inspiratory crackles - scattered. Improved air movement Cardiovascular: irregular irregular, tachycardic Abdomen: soft, non-tender, non-distended Extremities: trace edema Neuro: A&Ox3  Resolved Hospital Problem list     Assessment & Plan:  Acute Hypoxemic respiratory Failure In setting of RSV pneumonia and underlying mild restrictive/diffusion defects (2018 PFTs)  - Continue budesonide and brovana nebs. PRN duonebs.  - Wean oxygen as able to maintain, goal saturations above 90-92%  Hx of OSA on CPAP - Continue CPAP or Bipap at night  LABS    PULMONARY Recent Labs  Lab 05/19/20 1545  PHART 7.456*  PCO2ART 51.6*  PO2ART 60*  HCO3 36.1*  TCO2 38*  O2SAT 90.0    CBC Recent Labs  Lab 05/19/20 1545 05/19/20 1946 05/21/20 0216  HGB 15.6* 14.9 14.9  HCT 46.0 45.8 46.4*  WBC  --  8.2 11.5*  PLT  --  215 209    COAGULATION No results for input(s): INR in the last 168 hours.  CARDIAC  No results for input(s): TROPONINI in the last 168 hours. No results for input(s): PROBNP in the last 168 hours.   CHEMISTRY Recent Labs  Lab 05/19/20 1455 05/19/20 1455 05/19/20 1545 05/19/20 1545 05/19/20 1946 05/20/20 0133 05/21/20 0216  NA 136  --  134*  --   --  135 142  K 3.1*   < > 2.9*   < >  --  3.3* 4.7  CL 88*  --   --   --   --  90* 93*  CO2 31  --   --   --   --  29 36*  GLUCOSE 168*  --   --   --   --  420* 256*  BUN 27*  --   --   --   --  29* 32*  CREATININE 1.52*  --   --   --   --  1.52* 1.53*  CALCIUM 9.2  --   --   --   --  8.6* 9.6  MG  --   --   --   --  2.1  --  2.3   < > = values in this interval not displayed.   Estimated Creatinine Clearance: 43.1 mL/min (A) (by C-G formula based on SCr of 1.53 mg/dL (H)).   LIVER No results for input(s): AST, ALT,  ALKPHOS, BILITOT, PROT, ALBUMIN, INR in the last 168 hours.   INFECTIOUS Recent Labs  Lab 05/19/20 2323 05/20/20 0133  LATICACIDVEN 4.5* 4.2*  PROCALCITON  --  0.23     ENDOCRINE CBG (last 3)  Recent Labs    05/20/20 1633 05/20/20 2112 05/21/20 0711  GLUCAP 166* 230* 237*     Freda Jackson, MD [facility] Office: [phone]   See Amion for Pager Details

## 2020-05-22 ENCOUNTER — Inpatient Hospital Stay (HOSPITAL_COMMUNITY): Payer: PPO

## 2020-05-22 DIAGNOSIS — E876 Hypokalemia: Secondary | ICD-10-CM | POA: Diagnosis not present

## 2020-05-22 DIAGNOSIS — J9601 Acute respiratory failure with hypoxia: Secondary | ICD-10-CM | POA: Diagnosis not present

## 2020-05-22 DIAGNOSIS — I10 Essential (primary) hypertension: Secondary | ICD-10-CM | POA: Diagnosis not present

## 2020-05-22 DIAGNOSIS — E1169 Type 2 diabetes mellitus with other specified complication: Secondary | ICD-10-CM

## 2020-05-22 DIAGNOSIS — E1165 Type 2 diabetes mellitus with hyperglycemia: Secondary | ICD-10-CM

## 2020-05-22 DIAGNOSIS — I2510 Atherosclerotic heart disease of native coronary artery without angina pectoris: Secondary | ICD-10-CM | POA: Diagnosis not present

## 2020-05-22 LAB — BASIC METABOLIC PANEL
Anion gap: 10 (ref 5–15)
BUN: 32 mg/dL — ABNORMAL HIGH (ref 8–23)
CO2: 41 mmol/L — ABNORMAL HIGH (ref 22–32)
Calcium: 9.5 mg/dL (ref 8.9–10.3)
Chloride: 87 mmol/L — ABNORMAL LOW (ref 98–111)
Creatinine, Ser: 1.24 mg/dL — ABNORMAL HIGH (ref 0.44–1.00)
GFR, Estimated: 46 mL/min — ABNORMAL LOW (ref >60)
Glucose, Bld: 204 mg/dL — ABNORMAL HIGH (ref 70–99)
Potassium: 4 mmol/L (ref 3.5–5.1)
Sodium: 138 mmol/L (ref 135–145)

## 2020-05-22 LAB — GLUCOSE, CAPILLARY
Glucose-Capillary: 185 mg/dL — ABNORMAL HIGH (ref 70–99)
Glucose-Capillary: 205 mg/dL — ABNORMAL HIGH (ref 70–99)
Glucose-Capillary: 148 mg/dL — ABNORMAL HIGH (ref 70–99)
Glucose-Capillary: 188 mg/dL — ABNORMAL HIGH (ref 70–99)

## 2020-05-22 LAB — LEGIONELLA PNEUMOPHILA SEROGP 1 UR AG: L. pneumophila Serogp 1 Ur Ag: NEGATIVE

## 2020-05-22 LAB — FANA STAINING PATTERNS: Homogeneous Pattern: 1

## 2020-05-22 LAB — ANTINUCLEAR ANTIBODIES, IFA: ANA Ab, IFA: POSITIVE — AB

## 2020-05-22 MED ORDER — FUROSEMIDE 10 MG/ML IJ SOLN
80.0000 mg | Freq: Two times a day (BID) | INTRAMUSCULAR | Status: DC
  Administered 2020-05-22 – 2020-05-23 (×2): 80 mg via INTRAVENOUS
  Filled 2020-05-22 (×2): qty 8

## 2020-05-22 NOTE — Plan of Care (Signed)

## 2020-05-22 NOTE — Progress Notes (Signed)
Patient refused the use of BIPAP for the evening.  

## 2020-05-22 NOTE — Progress Notes (Addendum)
PROGRESS NOTE    [name]  BDZ:[id number] DOB: 1945/09/14 DOA: 05/19/2020 PCP: [name], MD    Brief Narrative:  [name] was admitted to the hospital working diagnosis of acute hypoxic respiratory failure related to acute diastolic heart failure complicated with atrial fibrillation with RVR in the setting of acute bronchitis due to RSV.  74 year old female past medical history for obesity class III, diastolic heart failure, coronary artery disease status post angioplasty, atrial fibrillation, pulmonary hypertension, who presented with dyspnea. Patient reported 5 days of dry cough, refractory to outpatient management with oral antibiotics. She had persistent and worsening symptoms that prompted her to come to the hospital.On her initial physical examination her oximetry was 82% on 6 L supplemental oxygen, blood pressure 122/58, heart rate ninety-three, respiratory rate thirty, oxygen saturation 90%,she had coarse breath sounds bilaterally, diffuse wheezing and rhonchi, heart S1-S2, present, tachycardic, abdomen soft, no lower extremity edema.  Sodium one thirty-six, potassium 3.1, chloride eighty-eight, bicarb thirty-one, glucose one sixty-eight, BUN twenty-seven, creatinine 1.52, troponin I twenty, white count 8.2, hemoglobin 14.9, hematocrit 45.8, platelets two fifteen. Respiratory panel positive for RSV. SARS COVID-19 negative. Chest radiograph with congested hilar vasculature bilaterally. No infiltrates. Chest CT with bilateral groundglass opacities. EKG 98 bpm, normal axis, first-degree AV block, sinus rhythm, Q waves in V1-V2, no significant ST segment or T wave changes.  Patient was placed on supplemental 02 per non re-breather and broad spectrum antibiotic therapy,  Developed atrial fibrillation with RVR and place on IV diltiazem.  Started on aggressive diuresis with furosemide.   Transitioned to oral AV blockade, added metoprolol for better rate control.    Old records personally reviewed: She follows with Dr. Cheryln Manly as outpatient, has diagnosis ofd tachybradycardia syndrome sp pacemaker ([name] dual chamber), sp ablation x3 for her atrial fibrillation (last on 09/20).  Plan to repeat ablation.    Assessment & Plan:   Principal Problem:   Acute respiratory failure (HCC) Active Problems:   Essential hypertension   CAD S/P percutaneous coronary angioplasty   Paroxysmal atrial fibrillation (HCC)   Pulmonary hypertension (HCC)   Severe obesity (BMI >= 40) (HCC)   Hyperlipidemia   Persistent atrial fibrillation (HCC)   Atypical atrial flutter (HCC)   Hypotension   Hypokalemia   Type 2 diabetes mellitus with hyperlipidemia ([place])   1. Acute hypoxemic respiratory failure due to decompensated diastolic heart failure with acute cardiogenic pulmonary edema. Pulmonary hypertension.  Urine output over last 24 hr 2,000 ml, oxygenation is 95% on 13 L/min per HFNC.   Echocardiogram with preserved LV systolic function EF 55 to 60%, elevated PA pressure 63 mmHg systolic.   Follow chest film this am, continue to wean down supplemental 02 to 10 L/min.  For now will continue with aggressive diuresis with furosemide.     2. Atrial fibrillation with rapid ventricular response. her heart rate has improved, with episodic episodes of RVR, this am up to 150 and 140 bpm.  Continue AV blockade with diltiazem 360 mg and metoprolol 25 mg po bid. Continue with dofetilide.  Continue treatment of bronchitis, if persistent RVR will plan to increase metoprolol to 50 mg po bid, continue close telemetry monitoring.   Continue antithrombotic therapy with apixaban.   3. RSV bronchitis/ viral pneumonia (present on admission). continue supplemental 02 per [place],  Keep 02 saturation more than 92%, continue with as needed bronchodilator therapy and antitussive agents. Inhaled LABA and corticosteroids with  arfomoterol and budesonide.   4. T2DM uncontrolled with  hyperglycemia/ dyslipidemia.  This am fasting glucose this am is 204 mg/dl, continue insulin regimen with basal insulin 10 units glargine and sliding scale aspart for glucose cover and monitoring,.   Continue with statin.    5. AKI with Hypokalemia/ metabolic alkalosis tolerating well diuresis with loop diuretics, serum cr today is 1,24 with K at 4,0 and bicarbonate at  41.   Continue with furosemide and  follow up on renal function in am, including Mg. Avoid hypotension and nephrotoxic medications.   6. Depression. On duloxetine   Patient continue to be at high risk for worsening respiratory failure   Status is: Inpatient  Remains inpatient appropriate because:IV treatments appropriate due to intensity of illness or inability to take PO   Dispo: The patient is from: Home              Anticipated d/c is to: Home              Anticipated d/c date is: 3 days              Patient currently is not medically stable to d/c.   DVT prophylaxis: apixaban   Code Status:   dnr   Family Communication:  No family at the bedside      Nutrition Status: Nutrition Problem: Increased nutrient needs Etiology: acute illness Signs/Symptoms: estimated needs Interventions: Glucerna shake, MVI, Prostat     Skin Documentation:     Consultants:   Cardiology   Pulmonary   Subjective: Patient with improved dyspnea but not yet back to baseline continue to have cough and now sore throat, no nausea or vomiting, no chest pain or palpitation.   Objective: Vitals:   05/22/20 0757 05/22/20 0800 05/22/20 0912 05/22/20 1047  BP: (!) 143/89     Pulse: (!) 145 (!) 142 (!) 115 98  Resp: 20 20    Temp: 98.5 F (36.9 C)     TempSrc: Oral     SpO2: 95%     Weight:      Height:        Intake/Output Summary (Last 24 hours) at 05/22/2020 1108 Last data filed at 05/22/2020 1013 Gross per 24 hour  Intake 960 ml  Output 1600 ml  Net -640 ml   Filed Weights   05/20/20 0255  Weight: 121 kg     Examination:   General: positive dyspnea at rest.  Neurology: Awake and alert, non focal  E ENT: no pallor, no icterus, oral mucosa moist Cardiovascular: No JVD. S1-S2 present, irregularly irregular with  no gallops, rubs, or murmurs. Trace non pitting lower extremity edema. Pulmonary: positive breath sounds bilaterally, decreased air movement, no wheezing, but bilateral scattered rhonchi and rales. Gastrointestinal. Abdomen soft and non tender Skin. No rashes Musculoskeletal: no joint deformities     Data Reviewed: I have personally reviewed following labs and imaging studies  CBC: Recent Labs  Lab 05/19/20 1545 05/19/20 1946 05/21/20 0216  WBC  --  8.2 11.5*  NEUTROABS  --   --  8.4*  HGB 15.6* 14.9 14.9  HCT 46.0 45.8 46.4*  MCV  --  94.4 96.5  PLT  --  215 400   Basic Metabolic Panel: Recent Labs  Lab 05/19/20 1455 05/19/20 1545 05/19/20 1946 05/20/20 0133 05/21/20 0216 05/22/20 0320  NA 136 134*  --  135 142 138  K 3.1* 2.9*  --  3.3* 4.7 4.0  CL 88*  --   --  90* 93* 87*  CO2 31  --   --  29 36* 41*  GLUCOSE 168*  --   --  420* 256* 204*  BUN 27*  --   --  29* 32* 32*  CREATININE 1.52*  --   --  1.52* 1.53* 1.24*  CALCIUM 9.2  --   --  8.6* 9.6 9.5  MG  --   --  2.1  --  2.3  --    GFR: Estimated Creatinine Clearance: 53.2 mL/min (A) (by C-G formula based on SCr of 1.24 mg/dL (H)). Liver Function Tests: No results for input(s): AST, ALT, ALKPHOS, BILITOT, PROT, ALBUMIN in the last 168 hours. No results for input(s): LIPASE, AMYLASE in the last 168 hours. No results for input(s): AMMONIA in the last 168 hours. Coagulation Profile: No results for input(s): INR, PROTIME in the last 168 hours. Cardiac Enzymes: No results for input(s): CKTOTAL, CKMB, CKMBINDEX, TROPONINI in the last 168 hours. BNP (last 3 results) No results for input(s): PROBNP in the last 8760 hours. HbA1C: Recent Labs    05/19/20 1946  HGBA1C 8.1*   CBG: Recent Labs  Lab  05/21/20 0711 05/21/20 1232 05/21/20 1626 05/21/20 2119 05/22/20 0754  GLUCAP 237* 159* 130* 251* 185*   Lipid Profile: No results for input(s): CHOL, HDL, LDLCALC, TRIG, CHOLHDL, LDLDIRECT in the last 72 hours. Thyroid Function Tests: No results for input(s): TSH, T4TOTAL, FREET4, T3FREE, THYROIDAB in the last 72 hours. Anemia Panel: No results for input(s): VITAMINB12, FOLATE, FERRITIN, TIBC, IRON, RETICCTPCT in the last 72 hours.    Radiology Studies: I have reviewed all of the imaging during this hospital visit personally     Scheduled Meds: . (feeding supplement) PROSource Plus  30 mL Oral BID BM  . apixaban  5 mg Oral BID  . arformoterol  15 mcg Nebulization BID  . vitamin C  1,000 mg Oral QHS  . atorvastatin  40 mg Oral Daily  . budesonide (PULMICORT) nebulizer solution  2 mg Nebulization BID  . calcium-vitamin D  1 tablet Oral Daily  . chlorpheniramine-HYDROcodone  5 mL Oral Q12H  . diltiazem  360 mg Oral Daily  . dofetilide  250 mcg Oral BID  . DULoxetine  60 mg Oral Daily  . feeding supplement (GLUCERNA SHAKE)  237 mL Oral TID BM  . furosemide  40 mg Oral BID  . insulin aspart  0-20 Units Subcutaneous TID WC  . insulin glargine  10 Units Subcutaneous Daily  . loratadine  10 mg Oral Daily  . metoprolol tartrate  25 mg Oral BID  . multivitamin with minerals  1 tablet Oral Daily  . pantoprazole  40 mg Oral Daily  . sodium chloride flush  3 mL Intravenous Q12H  . vitamin B-12  5,000 mcg Oral QHS  . Vitamin E  800 Units Oral QHS   And  . vitamin E  200 Units Oral QHS   Continuous Infusions: . sodium chloride       LOS: 3 days        Lavene Penagos Gerome Apley, MD

## 2020-05-23 DIAGNOSIS — J9601 Acute respiratory failure with hypoxia: Secondary | ICD-10-CM | POA: Diagnosis not present

## 2020-05-23 DIAGNOSIS — I484 Atypical atrial flutter: Secondary | ICD-10-CM

## 2020-05-23 DIAGNOSIS — I10 Essential (primary) hypertension: Secondary | ICD-10-CM | POA: Diagnosis not present

## 2020-05-23 DIAGNOSIS — I4819 Other persistent atrial fibrillation: Secondary | ICD-10-CM

## 2020-05-23 DIAGNOSIS — E785 Hyperlipidemia, unspecified: Secondary | ICD-10-CM

## 2020-05-23 DIAGNOSIS — I2510 Atherosclerotic heart disease of native coronary artery without angina pectoris: Secondary | ICD-10-CM | POA: Diagnosis not present

## 2020-05-23 DIAGNOSIS — I9589 Other hypotension: Secondary | ICD-10-CM

## 2020-05-23 LAB — BASIC METABOLIC PANEL
Anion gap: 8 (ref 5–15)
BUN: 32 mg/dL — ABNORMAL HIGH (ref 8–23)
CO2: 42 mmol/L — ABNORMAL HIGH (ref 22–32)
Calcium: 9.5 mg/dL (ref 8.9–10.3)
Chloride: 88 mmol/L — ABNORMAL LOW (ref 98–111)
Creatinine, Ser: 1.29 mg/dL — ABNORMAL HIGH (ref 0.44–1.00)
GFR, Estimated: 44 mL/min — ABNORMAL LOW (ref >60)
Glucose, Bld: 285 mg/dL — ABNORMAL HIGH (ref 70–99)
Potassium: 3.2 mmol/L — ABNORMAL LOW (ref 3.5–5.1)
Sodium: 138 mmol/L (ref 135–145)

## 2020-05-23 LAB — GLUCOSE, CAPILLARY
Glucose-Capillary: 166 mg/dL — ABNORMAL HIGH (ref 70–99)
Glucose-Capillary: 204 mg/dL — ABNORMAL HIGH (ref 70–99)
Glucose-Capillary: 175 mg/dL — ABNORMAL HIGH (ref 70–99)
Glucose-Capillary: 222 mg/dL — ABNORMAL HIGH (ref 70–99)

## 2020-05-23 LAB — MAGNESIUM: Magnesium: 2.3 mg/dL (ref 1.7–2.4)

## 2020-05-23 MED ORDER — POTASSIUM CHLORIDE CRYS ER 20 MEQ PO TBCR
40.0000 meq | EXTENDED_RELEASE_TABLET | ORAL | Status: AC
  Administered 2020-05-23 (×2): 40 meq via ORAL
  Filled 2020-05-23 (×2): qty 2

## 2020-05-23 MED ORDER — FLUTICASONE PROPIONATE 50 MCG/ACT NA SUSP
1.0000 | Freq: Two times a day (BID) | NASAL | Status: DC
  Administered 2020-05-23 – 2020-06-02 (×20): 1 via NASAL
  Filled 2020-05-23: qty 16

## 2020-05-23 MED ORDER — METOPROLOL TARTRATE 50 MG PO TABS
50.0000 mg | ORAL_TABLET | Freq: Two times a day (BID) | ORAL | Status: DC
  Administered 2020-05-23 – 2020-06-02 (×21): 50 mg via ORAL
  Filled 2020-05-23 (×21): qty 1

## 2020-05-23 MED ORDER — ACETAMINOPHEN 325 MG PO TABS
650.0000 mg | ORAL_TABLET | Freq: Four times a day (QID) | ORAL | Status: DC
  Administered 2020-05-23 – 2020-06-01 (×22): 650 mg via ORAL
  Filled 2020-05-23 (×23): qty 2

## 2020-05-23 NOTE — Progress Notes (Addendum)
PROGRESS NOTE    [name]  FFM:[id number] DOB: [birth year] DOA: 05/19/2020 PCP: [name], MD    Brief Narrative:  [name] was admitted to the hospital working diagnosis of acute hypoxic respiratory failurerelated to acute diastolic heart failurecomplicated withatrial fibrillation with RVR in the setting of acute bronchitis due to RSV.  74 year old female past medical history for obesity class III, diastolic heart failure, coronary artery disease status post angioplasty, atrial fibrillation, pulmonary hypertension, who presented with dyspnea. Patient reported 5 days of dry cough, refractory to outpatient management with oral antibiotics. She had persistent and worsening symptoms that prompted her to come to the hospital.On her initial physical examination her oximetry was 82% on 6 L supplemental oxygen, blood pressure 122/58, heart rate ninety-three, respiratory rate thirty, oxygen saturation 90%,she had coarse breath sounds bilaterally, diffuse wheezing and rhonchi, heart S1-S2, present, tachycardic, abdomen soft, no lower extremity edema.  Sodium one thirty-six, potassium 3.1, chloride eighty-eight, bicarb thirty-one, glucose one sixty-eight, BUN twenty-seven, creatinine 1.52, troponin I twenty, white count 8.2, hemoglobin 14.9, hematocrit 45.8, platelets two fifteen. Respiratory panel positive for RSV. SARS COVID-19 negative. Chest radiograph with congested hilar vasculature bilaterally. No infiltrates. Chest CT with bilateral groundglass opacities. EKG 98 bpm, normal axis, first-degree AV block, sinus rhythm, Q waves in V1-V2, no significant ST segment or T wave changes.  Patient was placed on supplemental 02 per non re-breather and broad spectrum antibiotic therapy,  Developed atrial fibrillation with RVR and place on IV diltiazem.  Started on aggressive diuresis with furosemide.  Transitioned to oral AV blockade, added metoprolol for better rate control.     Old records personally reviewed: She follows with Dr. Cheryln [name] as outpatient, has diagnosis ofd tachybradycardia syndrome sp pacemaker ([place] dual chamber), sp ablation x3 for her atrial fibrillation (last on 09/20). Plan to repeat ablation.     Assessment & Plan:   Principal Problem:   Acute respiratory failure (HCC) Active Problems:   Essential hypertension   CAD S/P percutaneous coronary angioplasty   Paroxysmal atrial fibrillation (HCC)   Pulmonary hypertension (HCC)   Severe obesity (BMI >= 40) (HCC)   Hyperlipidemia   Persistent atrial fibrillation (HCC)   Atypical atrial flutter (HCC)   Hypotension   Hypokalemia   Type 2 diabetes mellitus with hyperlipidemia ([place])   1. Acute hypoxemic respiratory failure due to decompensated diastolic heart failure with acute cardiogenic pulmonary edema.Pulmonary hypertension.  Urine output over last 24 hr 900 ml, oxygenation is 95% on 8 L/min per HFNC.   Echocardiogram with preserved LV systolic function EF 55 to 60%, elevated PA pressure 63 mmHg systolic. Chest film from yesterday with signs of pulmonary edema and furosemide was continued IV. This am able to taper down oxygen flow to 5 L/min with good toleration. Systolic blood pressure down to 98 mmHg.  Hold on furosemide for now,  her net fluid balance is negative to - 4,009 since admission.   2. Atrial fibrillation with rapid ventricular response. Continue to have episodic RVR, personally reviewed up to 140 bpm, at night went down to 80 bpm.   Continue with diltiazem 360 mg and will increase metoprolol to 50 mg po bid. On dofetilide.  Antithrombotic therapy with apixaban with good toleration.  3. RSV bronchitis/ viral pneumonia (present on admission).slowly improving oxygen requirements, today down to 5 L/min per HFNC, continue with as needed bronchodilator therapy andscheduled antitussive agents.  Inhaled LABA and corticosteroids.  Positive sore throat and  submandibular lymphadenopathy, continue pain control with  acetaminophen (change to scheduled), will change to scheduled, avoid non steroidal anti-inflammatory agents.   Change flonase to bid.   4. T2DMuncontrolled with hyperglycemia/ dyslipidemia.  Today fasting glucose this am is 285 mg/dl. Capillary 148 and 188.  Tolerating po well, with no nausea or vomiting, continue with insulin regimen plus basal insulin 10 units glargine plus sliding scale aspart for glucose cover and monitoring,.   On statin therapy with good toleration.   5. AKI with Hypokalemia/ metabolic alkalosisstable renal function with serum cr at 1,29 with K down to 3,2 and bicarbonate up 42. Mg is 2,3.   Add 80 meq in 2 divided doses and follow up on renal function in am, hold on furosemide for now.    6. Depression/ obesity class 3. Continue with duloxetine. BMI calculated at 42.      Patient continue to be at high risk for worsening atrial fibrillation   Status is: Inpatient  Remains inpatient appropriate because:IV treatments appropriate due to intensity of illness or inability to take PO   Dispo: The patient is from: Home              Anticipated d/c is to: Home              Anticipated d/c date is: 3 days              Patient currently is not medically stable to d/c.  DVT prophylaxis: apixaban   Code Status:   full  Family Communication:  No family at the bedside      Nutrition Status: Nutrition Problem: Increased nutrient needs Etiology: acute illness Signs/Symptoms: estimated needs Interventions: Glucerna shake, MVI, Prostat     Subjective: Patient continue to have dyspnea but slowly improving, no chest pain or palpitations, no nausea or vomiting. Positive sore throat worse with swallowing.   Objective: Vitals:   05/23/20 0558 05/23/20 0600 05/23/20 0737 05/23/20 0830  BP: (!) 153/73 (!) 133/93 113/79 (!) 98/57  Pulse: (!) 109 (!) 110 72   Resp: 19 (!) 23 (!) 22 18  Temp: 98.3 F  (36.8 C)  98.4 F (36.9 C)   TempSrc:   Oral   SpO2: 92% 93% 92%   Weight:      Height:        Intake/Output Summary (Last 24 hours) at 05/23/2020 0933 Last data filed at 05/23/2020 0600 Gross per 24 hour  Intake 360 ml  Output 900 ml  Net -540 ml   Filed Weights   05/20/20 0255  Weight: 121 kg    Examination:   General: deconditioned  Neurology: Awake and alert, non focal  E ENT: no pallor, no icterus, oral mucosa moist. Tender bilateral submandibular lymphadenopathy, more on the left.  Cardiovascular: No JVD. S1-S2 present, rhythmic, no gallops, rubs, or murmurs. No lower extremity edema. Pulmonary: positive breath sounds bilaterally, no wheezing, scattered rhonchi or rales. Gastrointestinal. Abdomen soft and non tender Skin. No rashes Musculoskeletal: no joint deformities     Data Reviewed: I have personally reviewed following labs and imaging studies  CBC: Recent Labs  Lab 05/19/20 1545 05/19/20 1946 05/21/20 0216  WBC  --  8.2 11.5*  NEUTROABS  --   --  8.4*  HGB 15.6* 14.9 14.9  HCT 46.0 45.8 46.4*  MCV  --  94.4 96.5  PLT  --  215 016   Basic Metabolic Panel: Recent Labs  Lab 05/19/20 1455 05/19/20 1455 05/19/20 1545 05/19/20 1946 05/20/20 0133 05/21/20 0216 05/22/20 0320 05/23/20 0142  NA 136   < > 134*  --  135 142 138 138  K 3.1*   < > 2.9*  --  3.3* 4.7 4.0 3.2*  CL 88*  --   --   --  90* 93* 87* 88*  CO2 31  --   --   --  29 36* 41* 42*  GLUCOSE 168*  --   --   --  420* 256* 204* 285*  BUN 27*  --   --   --  29* 32* 32* 32*  CREATININE 1.52*  --   --   --  1.52* 1.53* 1.24* 1.29*  CALCIUM 9.2  --   --   --  8.6* 9.6 9.5 9.5  MG  --   --   --  2.1  --  2.3  --  2.3   < > = values in this interval not displayed.   GFR: Estimated Creatinine Clearance: 51.2 mL/min (A) (by C-G formula based on SCr of 1.29 mg/dL (H)). Liver Function Tests: No results for input(s): AST, ALT, ALKPHOS, BILITOT, PROT, ALBUMIN in the last 168 hours. No  results for input(s): LIPASE, AMYLASE in the last 168 hours. No results for input(s): AMMONIA in the last 168 hours. Coagulation Profile: No results for input(s): INR, PROTIME in the last 168 hours. Cardiac Enzymes: No results for input(s): CKTOTAL, CKMB, CKMBINDEX, TROPONINI in the last 168 hours. BNP (last 3 results) No results for input(s): PROBNP in the last 8760 hours. HbA1C: No results for input(s): HGBA1C in the last 72 hours. CBG: Recent Labs  Lab 05/22/20 0754 05/22/20 1207 05/22/20 1628 05/22/20 2120 05/23/20 0738  GLUCAP 185* 205* 148* 188* 166*   Lipid Profile: No results for input(s): CHOL, HDL, LDLCALC, TRIG, CHOLHDL, LDLDIRECT in the last 72 hours. Thyroid Function Tests: No results for input(s): TSH, T4TOTAL, FREET4, T3FREE, THYROIDAB in the last 72 hours. Anemia Panel: No results for input(s): VITAMINB12, FOLATE, FERRITIN, TIBC, IRON, RETICCTPCT in the last 72 hours.    Radiology Studies: I have reviewed all of the imaging during this hospital visit personally     Scheduled Meds: . (feeding supplement) PROSource Plus  30 mL Oral BID BM  . apixaban  5 mg Oral BID  . arformoterol  15 mcg Nebulization BID  . vitamin C  1,000 mg Oral QHS  . atorvastatin  40 mg Oral Daily  . budesonide (PULMICORT) nebulizer solution  2 mg Nebulization BID  . calcium-vitamin D  1 tablet Oral Daily  . chlorpheniramine-HYDROcodone  5 mL Oral Q12H  . diltiazem  360 mg Oral Daily  . dofetilide  250 mcg Oral BID  . DULoxetine  60 mg Oral Daily  . feeding supplement (GLUCERNA SHAKE)  237 mL Oral TID BM  . furosemide  80 mg Intravenous Q12H  . insulin aspart  0-20 Units Subcutaneous TID WC  . insulin glargine  10 Units Subcutaneous Daily  . loratadine  10 mg Oral Daily  . metoprolol tartrate  25 mg Oral BID  . multivitamin with minerals  1 tablet Oral Daily  . pantoprazole  40 mg Oral Daily  . potassium chloride  40 mEq Oral Q4H  . sodium chloride flush  3 mL Intravenous  Q12H  . vitamin B-12  5,000 mcg Oral QHS  . Vitamin E  800 Units Oral QHS   And  . vitamin E  200 Units Oral QHS   Continuous Infusions: . sodium chloride       LOS:  4 days        Milicent Acheampong Gerome Apley, MD

## 2020-05-23 NOTE — Plan of Care (Signed)

## 2020-05-23 NOTE — Progress Notes (Signed)
RT note. Pt. Refusing CPAP machine due to IV line.

## 2020-05-24 ENCOUNTER — Telehealth: Payer: PPO | Admitting: Physician Assistant

## 2020-05-24 DIAGNOSIS — I484 Atypical atrial flutter: Secondary | ICD-10-CM | POA: Diagnosis not present

## 2020-05-24 DIAGNOSIS — J9601 Acute respiratory failure with hypoxia: Secondary | ICD-10-CM | POA: Diagnosis not present

## 2020-05-24 DIAGNOSIS — I10 Essential (primary) hypertension: Secondary | ICD-10-CM | POA: Diagnosis not present

## 2020-05-24 DIAGNOSIS — I2510 Atherosclerotic heart disease of native coronary artery without angina pectoris: Secondary | ICD-10-CM | POA: Diagnosis not present

## 2020-05-24 LAB — GLUCOSE, CAPILLARY
Glucose-Capillary: 235 mg/dL — ABNORMAL HIGH (ref 70–99)
Glucose-Capillary: 145 mg/dL — ABNORMAL HIGH (ref 70–99)
Glucose-Capillary: 156 mg/dL — ABNORMAL HIGH (ref 70–99)
Glucose-Capillary: 209 mg/dL — ABNORMAL HIGH (ref 70–99)

## 2020-05-24 LAB — BASIC METABOLIC PANEL
Anion gap: 10 (ref 5–15)
BUN: 33 mg/dL — ABNORMAL HIGH (ref 8–23)
CO2: 38 mmol/L — ABNORMAL HIGH (ref 22–32)
Calcium: 9.7 mg/dL (ref 8.9–10.3)
Chloride: 91 mmol/L — ABNORMAL LOW (ref 98–111)
Creatinine, Ser: 1.16 mg/dL — ABNORMAL HIGH (ref 0.44–1.00)
GFR, Estimated: 49 mL/min — ABNORMAL LOW (ref >60)
Glucose, Bld: 194 mg/dL — ABNORMAL HIGH (ref 70–99)
Potassium: 5.6 mmol/L — ABNORMAL HIGH (ref 3.5–5.1)
Sodium: 139 mmol/L (ref 135–145)

## 2020-05-24 MED ORDER — POLYETHYLENE GLYCOL 3350 17 G PO PACK
17.0000 g | PACK | Freq: Every day | ORAL | Status: DC | PRN
  Administered 2020-05-24: 17 g via ORAL
  Filled 2020-05-24: qty 1

## 2020-05-24 MED ORDER — SENNOSIDES-DOCUSATE SODIUM 8.6-50 MG PO TABS
1.0000 | ORAL_TABLET | Freq: Every evening | ORAL | Status: DC | PRN
  Administered 2020-05-24 – 2020-05-30 (×2): 1 via ORAL
  Filled 2020-05-24 (×2): qty 1

## 2020-05-24 MED ORDER — TORSEMIDE 20 MG PO TABS
40.0000 mg | ORAL_TABLET | Freq: Every day | ORAL | Status: DC
  Administered 2020-05-24 – 2020-05-27 (×4): 40 mg via ORAL
  Filled 2020-05-24 (×4): qty 2

## 2020-05-24 NOTE — Care Management Important Message (Signed)
Important Message  Patient Details  Name: [name] MRN: [id number] Date of Birth: Feb 25, 1946   Medicare Important Message Given:  Yes     [name] 05/24/2020, 2:50 PM

## 2020-05-24 NOTE — Progress Notes (Signed)
NAMENATIVIDAD SCHLOSSER, MRN:  [id number], DOB:  1946-07-03, LOS: 5 ADMISSION DATE:  05/19/2020, CONSULTATION DATE:  05/19/2020  REFERRING MD:  TRH MD, CHIEF COMPLAINT:  Acute resp failure   Brief History   [name] is a 74 year old woman with atrial fibrillation, type 2 diabetes, hypertension, OSA on CPAP, HFpEF with mild restrictive lung disease and mild diffusion defect who presented to [facility] on 05/19/20 with progressive shortness of breath and has been admitted for acute on chronic hypxemic respiratory failure. She is found to have positive RSV on respiratory pathogen panel.   Significant Hospital Events   05/19/2020 - admit  Consults:  05/19/2020 - pulm  Procedures:    Significant Diagnostic Tests:  CT Chest 05/19/20 Mediastinal and probable bilateral hilar adenopathy (difficult to visualize without intravenous contrast). This is unchanged since 2019.  Peribronchial thickening and areas of atelectasis and ground-glass opacities throughout the upper lobes. Findings could reflect bronchitis. Cannot exclude early bronchopneumonia, particularly in the right upper lobe and superior segment of the right lower lobe.  Micro Data:  cpvid pcr - 05/19/2020  neg Flu pcr 05/19/2020 neg Urine strep 11/17 Urine leg 11/17 Multiplex Resp PCR 11/17 - Postive for RSV   Antimicrobials:   ceftriaxine 11/17 Doxy 11/17 azithro 11/1   Interim history/subjective:  Improving, on 5L [place]. Working with PT.  Objective   Blood pressure 121/69, pulse 91, temperature 98.7 F (37.1 C), temperature source Oral, resp. rate 16, height 5' 6.5" (1.689 m), weight 121 kg, SpO2 94 %.        Intake/Output Summary (Last 24 hours) at 05/24/2020 1409 Last data filed at 05/24/2020 1311 Gross per 24 hour  Intake 6 ml  Output --  Net 6 ml   Filed Weights   05/20/20 0255  Weight: 121 kg    Examination: General: obese, no distress. On [place].  HENT: sclera anicteric, moist mucous membranes Lungs: End  inspiratory crackles - scattered Cardiovascular: regular rhythm, tachycardic Abdomen: soft, non-tender, non-distended Extremities: trace edema Neuro: A&Ox3  Resolved Hospital Problem list     Assessment & Plan:  Acute Hypoxemic respiratory Failure In setting of RSV pneumonia and underlying mild restrictive/diffusion defects (2018 PFTs). May need oxygen on discharge, suspicion for OHS given elevated Bicarb on BMP.  - Continue budesonide and brovana nebs. PRN duonebs.  - Wean oxygen as able to maintain, goal saturations above 90-92% - Requested pulm f/u  Hx of OSA on CPAP - Continue CPAP or Bipap at night  We will sign off.  LABS    PULMONARY Recent Labs  Lab 05/19/20 1545  PHART 7.456*  PCO2ART 51.6*  PO2ART 60*  HCO3 36.1*  TCO2 38*  O2SAT 90.0    CBC Recent Labs  Lab 05/19/20 1545 05/19/20 1946 05/21/20 0216  HGB 15.6* 14.9 14.9  HCT 46.0 45.8 46.4*  WBC  --  8.2 11.5*  PLT  --  215 209    COAGULATION No results for input(s): INR in the last 168 hours.  CARDIAC  No results for input(s): TROPONINI in the last 168 hours. No results for input(s): PROBNP in the last 168 hours.   CHEMISTRY Recent Labs  Lab 05/19/20 1455 05/19/20 1946 05/20/20 0133 05/20/20 0133 05/21/20 0216 05/21/20 0216 05/22/20 0320 05/22/20 0320 05/23/20 0142 05/24/20 0313  NA   < >  --  135  --  142  --  138  --  138 139  K   < >  --  3.3*   < >  4.7   < > 4.0   < > 3.2* 5.6*  CL   < >  --  90*  --  93*  --  87*  --  88* 91*  CO2   < >  --  29  --  36*  --  41*  --  42* 38*  GLUCOSE   < >  --  420*  --  256*  --  204*  --  285* 194*  BUN   < >  --  29*  --  32*  --  32*  --  32* 33*  CREATININE   < >  --  1.52*  --  1.53*  --  1.24*  --  1.29* 1.16*  CALCIUM   < >  --  8.6*  --  9.6  --  9.5  --  9.5 9.7  MG  --  2.1  --   --  2.3  --   --   --  2.3  --    < > = values in this interval not displayed.   Estimated Creatinine Clearance: 56.9 mL/min (A) (by C-G formula based  on SCr of 1.16 mg/dL (H)).   LIVER No results for input(s): AST, ALT, ALKPHOS, BILITOT, PROT, ALBUMIN, INR in the last 168 hours.   INFECTIOUS Recent Labs  Lab 05/19/20 2323 05/20/20 0133 05/21/20 0927  LATICACIDVEN 4.5* 4.2* 2.8*  PROCALCITON  --  0.23  --      ENDOCRINE CBG (last 3)  Recent Labs    05/23/20 2052 05/24/20 0731 05/24/20 1122  GLUCAP 222* 235* 145*

## 2020-05-24 NOTE — Progress Notes (Addendum)
Inpatient Diabetes Program Recommendations  AACE/ADA: New Consensus Statement on Inpatient Glycemic Control (2015)  Target Ranges:  Prepandial:   less than 140 mg/dL      Peak postprandial:   less than 180 mg/dL (1-2 hours)      Critically ill patients:  140 - 180 mg/dL   Lab Results  Component Value Date   GLUCAP 235 (H) 05/24/2020   HGBA1C 8.1 (H) 05/19/2020    Review of Glycemic Control Results for [name], [name] (MRN [id number]) as of 05/24/2020 10:34  Ref. Range 05/23/2020 07:38 05/23/2020 11:55 05/23/2020 15:44 05/23/2020 20:52 05/24/2020 07:31  Glucose-Capillary Latest Ref Range: 70 - 99 mg/dL 166 (H) 204 (H) 175 (H) 222 (H) 235 (H)   Diabetes history: DM 2 Outpatient Diabetes medications: None Current orders for Inpatient glycemic control:  Novolog resistant tid with meals Lantus 10 units daily Inpatient Diabetes Program Recommendations:    Please consider increasing Lantus to 15 units daily.  Also may consider adding Tradjenta 5 mg daily which can be continued after d/c- Low risk for hypoglycemia.   Thanks, Adah Perl, RN, BC-ADM Inpatient Diabetes Coordinator Pager [phone] (8a-5p)

## 2020-05-24 NOTE — Progress Notes (Signed)
PROGRESS NOTE    [name]  KGY:[id number] DOB: 01/09/1946 DOA: 05/19/2020 PCP: [name], MD    Brief Narrative:  [name] was admitted to the hospital working diagnosis of acute hypoxic respiratory failurerelated to acute diastolic heart failurecomplicated withatrial fibrillation with RVR in the setting of acute bronchitis due to RSV.  74 year old female past medical history for obesity class III, diastolic heart failure, coronary artery disease status post angioplasty, atrial fibrillation, pulmonary hypertension, who presented with dyspnea. Patient reported 5 days of dry cough, refractory to outpatient management with oral antibiotics. She had persistent and worsening symptoms that prompted her to come to the hospital.On her initial physical examination her oximetry was 82% on 6 L supplemental oxygen, blood pressure 122/58, heart rate ninety-three, respiratory rate thirty, oxygen saturation 90%,she had coarse breath sounds bilaterally, diffuse wheezing and rhonchi, heart S1-S2, present, tachycardic, abdomen soft, no lower extremity edema.  Sodium one thirty-six, potassium 3.1, chloride eighty-eight, bicarb thirty-one, glucose one sixty-eight, BUN twenty-seven, creatinine 1.52, troponin I twenty, white count 8.2, hemoglobin 14.9, hematocrit 45.8, platelets two fifteen. Respiratory panel positive for RSV. SARS COVID-19 negative. Chest radiograph with congested hilar vasculature bilaterally. No infiltrates. Chest CT with bilateral groundglass opacities. EKG 98 bpm, normal axis, first-degree AV block, sinus rhythm, Q waves in V1-V2, no significant ST segment or T wave changes.  Patient was placed on supplemental 02 per non re-breather and broad spectrum antibiotic therapy,  Developed atrial fibrillation with RVR and place on IV diltiazem.  Started on aggressive diuresis with furosemide.  Transitioned to oral AV blockade, added metoprolol for better rate  control.  Old records personally reviewed: She follows with Dr. Cheryln Manly as outpatient, has diagnosis ofd tachybradycardia syndrome sp pacemaker ([place]ard dual chamber), sp ablation x3 for her atrial fibrillation (last on 09/20). Plan to repeat ablation.   Assessment & Plan:   Principal Problem:   Acute respiratory failure (HCC) Active Problems:   Essential hypertension   CAD S/P percutaneous coronary angioplasty   Paroxysmal atrial fibrillation (HCC)   Pulmonary hypertension (HCC)   Severe obesity (BMI >= 40) (HCC)   Hyperlipidemia   Persistent atrial fibrillation (HCC)   Atypical atrial flutter (HCC)   Hypotension   Hypokalemia   Type 2 diabetes mellitus with hyperlipidemia ([place])   1. Acute hypoxemic respiratory failure due to decompensated diastolic heart failure with acute cardiogenic pulmonary edema.Pulmonary hypertension.  Echocardiogram with preserved LV systolic function EF 55 to 60%, elevated PA pressure 63 mmHg systolic.  Tolerating well supplemental oxygen at 5 L/min per HFNC.   Resume oral diuretic therapy with torsemide 40 mg daily, continue to target negative fluid balance.    2. Atrial fibrillation with rapid ventricular response. patient continue in atrial fibrillation rhythm, rate has been between 65 and lower than 105.   Tolerating well metoprolol 50 mg bid and diltiazem 360 mg, continue with dofetilide. Anticoagulation with apixaban.   3. RSV bronchitis/ viral pneumonia (present on admission). Positive submandibular painful lymphadenopathy. tolerating well 5 L/ min per HFNC. Continue to have expiratory wheezing.   Continue with inhaled LABA and inhaled corticosteroids. Improved painful lymphadenopathy with acetaminophen.   4. T2DMuncontrolled with hyperglycemia/ dyslipidemia. Fasting glucose this am is 194, capillary 175, 222 and 235. Improved PO intake. Continue current insulin regimen with 10 units basal and insulin sliding scale.    5. AKI with Hypokalemia/ hyperkalemia with metabolic alkalosisrenal function with serum cr down to 1,16 with K at 5,6 and bicarbonate at 38.  Resume diuresis with  oral torsemide 40 mg daily to keep negative fluid balance.   6. Depression/ obesity class 3.On duloxetine. BMI calculated at 42. Will need outpatient follow up.    Patient continue to be at high risk for worsening atrial fibrillation and hyperkalemia.   Status is: Inpatient  Remains inpatient appropriate because:IV treatments appropriate due to intensity of illness or inability to take PO and Inpatient level of care appropriate due to severity of illness   Dispo: The patient is from: Home              Anticipated d/c is to: Home              Anticipated d/c date is: 3 days              Patient currently is not medically stable to d/c.   DVT prophylaxis: apixaban   Code Status:   full  Family Communication:  No family at the bedside      Nutrition Status: Nutrition Problem: Increased nutrient needs Etiology: acute illness Signs/Symptoms: estimated needs Interventions: Glucerna shake, MVI, Prostat      Consultants:   Cardiology   Pulmonary      Subjective: Patient with improvement dyspnea and sore throat, not yet back to baseline, continue to have significant dyspnea on exertion, no chest pain, no nausea or vomiting.   Objective: Vitals:   05/23/20 2000 05/23/20 2005 05/23/20 2135 05/24/20 0821  BP: 122/84  135/63   Pulse: 79  81 78  Resp: 19   19  Temp:      TempSrc:      SpO2: 91% 95%  94%  Weight:      Height:        Intake/Output Summary (Last 24 hours) at 05/24/2020 1008 Last data filed at 05/23/2020 1010 Gross per 24 hour  Intake 3 ml  Output --  Net 3 ml   Filed Weights   05/20/20 0255  Weight: 121 kg    Examination:   General: deconditioned, positive dyspnea at rest Neurology: Awake and alert, non focal  E ENT: mild pallor, no icterus, oral mucosa  moist Cardiovascular: No JVD. S1-S2 present, rhythmic, no gallops, rubs, or murmurs. Trace non pitting lower extremity edema. Pulmonary: positive breath sounds bilaterally, mild expiratory wheezing, with scattered rhonchi and rales. Gastrointestinal. Abdomen soft and non tender, protuberant. Skin. No rashes Musculoskeletal: no joint deformities     Data Reviewed: I have personally reviewed following labs and imaging studies  CBC: Recent Labs  Lab 05/19/20 1545 05/19/20 1946 05/21/20 0216  WBC  --  8.2 11.5*  NEUTROABS  --   --  8.4*  HGB 15.6* 14.9 14.9  HCT 46.0 45.8 46.4*  MCV  --  94.4 96.5  PLT  --  215 716   Basic Metabolic Panel: Recent Labs  Lab 05/19/20 1455 05/19/20 1946 05/20/20 0133 05/21/20 0216 05/22/20 0320 05/23/20 0142 05/24/20 0313  NA   < >  --  135 142 138 138 139  K   < >  --  3.3* 4.7 4.0 3.2* 5.6*  CL   < >  --  90* 93* 87* 88* 91*  CO2   < >  --  29 36* 41* 42* 38*  GLUCOSE   < >  --  420* 256* 204* 285* 194*  BUN   < >  --  29* 32* 32* 32* 33*  CREATININE   < >  --  1.52* 1.53* 1.24* 1.29* 1.16*  CALCIUM   < >  --  8.6* 9.6 9.5 9.5 9.7  MG  --  2.1  --  2.3  --  2.3  --    < > = values in this interval not displayed.   GFR: Estimated Creatinine Clearance: 56.9 mL/min (A) (by C-G formula based on SCr of 1.16 mg/dL (H)). Liver Function Tests: No results for input(s): AST, ALT, ALKPHOS, BILITOT, PROT, ALBUMIN in the last 168 hours. No results for input(s): LIPASE, AMYLASE in the last 168 hours. No results for input(s): AMMONIA in the last 168 hours. Coagulation Profile: No results for input(s): INR, PROTIME in the last 168 hours. Cardiac Enzymes: No results for input(s): CKTOTAL, CKMB, CKMBINDEX, TROPONINI in the last 168 hours. BNP (last 3 results) No results for input(s): PROBNP in the last 8760 hours. HbA1C: No results for input(s): HGBA1C in the last 72 hours. CBG: Recent Labs  Lab 05/23/20 0738 05/23/20 1155 05/23/20 1544  05/23/20 2052 05/24/20 0731  GLUCAP 166* 204* 175* 222* 235*   Lipid Profile: No results for input(s): CHOL, HDL, LDLCALC, TRIG, CHOLHDL, LDLDIRECT in the last 72 hours. Thyroid Function Tests: No results for input(s): TSH, T4TOTAL, FREET4, T3FREE, THYROIDAB in the last 72 hours. Anemia Panel: No results for input(s): VITAMINB12, FOLATE, FERRITIN, TIBC, IRON, RETICCTPCT in the last 72 hours.    Radiology Studies: I have reviewed all of the imaging during this hospital visit personally     Scheduled Meds: . (feeding supplement) PROSource Plus  30 mL Oral BID BM  . acetaminophen  650 mg Oral Q6H  . apixaban  5 mg Oral BID  . arformoterol  15 mcg Nebulization BID  . vitamin C  1,000 mg Oral QHS  . atorvastatin  40 mg Oral Daily  . budesonide (PULMICORT) nebulizer solution  2 mg Nebulization BID  . calcium-vitamin D  1 tablet Oral Daily  . chlorpheniramine-HYDROcodone  5 mL Oral Q12H  . diltiazem  360 mg Oral Daily  . dofetilide  250 mcg Oral BID  . DULoxetine  60 mg Oral Daily  . feeding supplement (GLUCERNA SHAKE)  237 mL Oral TID BM  . fluticasone  1 spray Each Nare BID  . insulin aspart  0-20 Units Subcutaneous TID WC  . insulin glargine  10 Units Subcutaneous Daily  . loratadine  10 mg Oral Daily  . metoprolol tartrate  50 mg Oral BID  . multivitamin with minerals  1 tablet Oral Daily  . pantoprazole  40 mg Oral Daily  . sodium chloride flush  3 mL Intravenous Q12H  . vitamin B-12  5,000 mcg Oral QHS  . Vitamin E  800 Units Oral QHS   And  . vitamin E  200 Units Oral QHS   Continuous Infusions: . sodium chloride       LOS: 5 days        Ishani Goldwasser Gerome Apley, MD

## 2020-05-25 DIAGNOSIS — I2510 Atherosclerotic heart disease of native coronary artery without angina pectoris: Secondary | ICD-10-CM | POA: Diagnosis not present

## 2020-05-25 DIAGNOSIS — I10 Essential (primary) hypertension: Secondary | ICD-10-CM | POA: Diagnosis not present

## 2020-05-25 DIAGNOSIS — J9601 Acute respiratory failure with hypoxia: Secondary | ICD-10-CM | POA: Diagnosis not present

## 2020-05-25 DIAGNOSIS — I484 Atypical atrial flutter: Secondary | ICD-10-CM | POA: Diagnosis not present

## 2020-05-25 DIAGNOSIS — E1169 Type 2 diabetes mellitus with other specified complication: Secondary | ICD-10-CM

## 2020-05-25 LAB — BASIC METABOLIC PANEL
Anion gap: 12 (ref 5–15)
BUN: 29 mg/dL — ABNORMAL HIGH (ref 8–23)
CO2: 36 mmol/L — ABNORMAL HIGH (ref 22–32)
Calcium: 9.6 mg/dL (ref 8.9–10.3)
Chloride: 90 mmol/L — ABNORMAL LOW (ref 98–111)
Creatinine, Ser: 1.14 mg/dL — ABNORMAL HIGH (ref 0.44–1.00)
GFR, Estimated: 51 mL/min — ABNORMAL LOW (ref >60)
Glucose, Bld: 179 mg/dL — ABNORMAL HIGH (ref 70–99)
Potassium: 4 mmol/L (ref 3.5–5.1)
Sodium: 138 mmol/L (ref 135–145)

## 2020-05-25 LAB — GLUCOSE, CAPILLARY
Glucose-Capillary: 144 mg/dL — ABNORMAL HIGH (ref 70–99)
Glucose-Capillary: 174 mg/dL — ABNORMAL HIGH (ref 70–99)
Glucose-Capillary: 98 mg/dL (ref 70–99)
Glucose-Capillary: 284 mg/dL — ABNORMAL HIGH (ref 70–99)

## 2020-05-25 NOTE — Progress Notes (Signed)
Occupational Therapy Treatment Patient Details Name: [name] MRN: [id number] DOB: 1945-09-06 Today's Date: 05/25/2020    History of present illness 74 y.o. female with medical history significant for morbid obesity, congestive heart failure, coronary artery disease status post stent angioplasty, history of A. fib status post ablation on chronic anticoagulation therapy with Eliquis, pulmonary hypertension status post pacemaker insertion who presents to the ER on 11/17 for worsening shortness of breath. Workup for acute on chronic diastolic heart failure, RSV.   OT comments  Pt received in bed, agreeable to OT session. Pt limited this session secondary to soft BP 83/45 sitting EOB, 97/45 return to supine, 111/56 slight trendelenburg. HR 60s-70s during session, SpO2 87%-88% on 5L HFNC. Pt required minA for LB dressing sitting EOB. She required supervision for sit<>stand. RN arrived during session to monitor pt and adjust O2. Reviewed energy conservation strategies with pt and provided handout. Pt will continue to benefit from skilled OT services to maximize safety and independence with ADL/IADL and functional mobility. Will continue to follow acutely and progress as tolerated.    Follow Up Recommendations  No OT follow up;Supervision/Assistance - 24 hour    Equipment Recommendations  None recommended by OT    Recommendations for Other Services      Precautions / Restrictions Precautions Precautions: Other (comment) Precaution Comments: mod fall Restrictions Weight Bearing Restrictions: No       Mobility Bed Mobility Overal bed mobility: Modified Independent Bed Mobility: Supine to Sit;Sit to Supine     Supine to sit: Modified independent (Device/Increase time);HOB elevated        Transfers Overall transfer level: Modified independent Equipment used: Rolling walker (2 wheeled) Transfers: Sit to/from [facility] Sit to Stand: Modified independent  (Device/Increase time) Stand pivot transfers: Supervision       General transfer comment: VCs hand placement    Balance Overall balance assessment: Modified Independent                                         ADL either performed or assessed with clinical judgement   ADL Overall ADL's : Needs assistance/impaired     Grooming: Minimal assistance Grooming Details (indicate cue type and reason): minA to brush hair sitting EOB             Lower Body Dressing: Minimal assistance;Sit to/from stand Lower Body Dressing Details (indicate cue type and reason): minimal assistance to don briefs over feet Toilet Transfer: Min Designer, jewellery Details (indicate cue type and reason): simulated         Functional mobility during ADLs: Supervision/safety (SPT only) General ADL Comments: pt with decreased recall of ECS, required vc for verbalization of strategies. Pt limited by BP this sesion;Provided pt with ECS handout     Vision       Perception     Praxis      Cognition Arousal/Alertness: Awake/alert Behavior During Therapy: WFL for tasks assessed/performed Overall Cognitive Status: Within Functional Limits for tasks assessed                                          Exercises     Shoulder Instructions       General Comments HR 60s-70s, BP83/45 sitting EOB, 97/45 return to supine, 111/56 with slight trendelenburg;  RN notified, alerted MD; SpO2 87%-88% on 5lnc, notified RN who bumped pt's O2 up    Pertinent Vitals/ Pain       Pain Assessment: No/denies pain  Home Living                                          Prior Functioning/Environment              Frequency  Min 2X/week        Progress Toward Goals  OT Goals(current goals can now be found in the care plan section)  Progress towards OT goals: Not progressing toward goals - comment (limited by BP and spO2)  Acute Rehab OT  Goals Patient Stated Goal: to breathe better so I can walk in the parks OT Goal Formulation: With patient/family Time For Goal Achievement: 06/03/20 Potential to Achieve Goals: Good ADL Goals Pt Will Perform Grooming: with modified independence;sitting Pt Will Perform Upper Body Dressing: with modified independence;sitting Pt Will Perform Lower Body Dressing: sit to/from stand;with supervision Pt Will Transfer to Toilet: with modified independence;ambulating Pt Will Perform Toileting - Clothing Manipulation and hygiene: with modified independence;sit to/from stand Additional ADL Goal #1: Pt will recall and implement 3 energy conservation strategies during ADL at independent level  Plan Discharge plan remains appropriate    Co-evaluation                 AM-PAC OT "6 Clicks" Daily Activity     Outcome Measure   Help from another person eating meals?: None Help from another person taking care of personal grooming?: A Little Help from another person toileting, which includes using toliet, bedpan, or urinal?: A Little Help from another person bathing (including washing, rinsing, drying)?: A Little Help from another person to put on and taking off regular upper body clothing?: A Little Help from another person to put on and taking off regular lower body clothing?: A Little 6 Click Score: 19    End of Session Equipment Utilized During Treatment: Oxygen  OT Visit Diagnosis: Other abnormalities of gait and mobility (R26.89);Other (comment)   Activity Tolerance Patient tolerated treatment well   Patient Left in bed;with call bell/phone within reach   Nurse Communication Other (comment) (RN placed pt on 15L via [place])        Time: 1239-1300 OT Time Calculation (min): 21 min  Charges: OT General Charges $OT Visit: 1 Visit OT Treatments $Self Care/Home Management : 8-22 mins  Helene Kelp OTR/L Acute Rehabilitation Services Office: [place] 05/25/2020, 1:06 PM

## 2020-05-25 NOTE — Progress Notes (Signed)
Nutrition Follow-up  DOCUMENTATION CODES:   Morbid obesity  INTERVENTION:   Continue Glucerna Shake po TID, each supplement provides 220 kcal and 10 grams of protein  Continue 58ml Prosource Plus po BID, each supplement provides 100 kcals and 15 grams of protein  Continue MVI daily   NUTRITION DIAGNOSIS:   Increased nutrient needs related to acute illness as evidenced by estimated needs.  ongoing  GOAL:   Patient will meet greater than or equal to 90% of their needs  progressing  MONITOR:   PO intake, Supplement acceptance, Labs, Weight trends, I & O's  REASON FOR ASSESSMENT:   Consult Assessment of nutrition requirement/status  ASSESSMENT:   Pt admitted with acute respiratory failure in setting of RSV pneumonia and underlying mild restrictive/diffusion defects. PMH includes Afib, type 2 DM, HTN, OSA on CPAP, CHF.  Pt with improvement of dyspnea and sore throat per MD, but is not yet back to baseline. Pt on 5L/min per HFNC.  Pt denies N/V and reports tolerating po well; however, no meal documentation available. Pt has been receiving 64ml Prosource Plus po BID and Glucerna TID. Per RN, pt typically doing well with supplements. Will continue with current nutrition plan of care.   No UOP documented.  Labs: CBGs 144-209 (Diabetes Coordinator following) Medications: vitamin C, oscal with D, ss novolog, 10 units lantus daily, mvi, protonix, vitamin B12, vitamin E  Diet Order:   Diet Order            Diet Carb Modified Fluid consistency: Thin; Room service appropriate? Yes  Diet effective now                 EDUCATION NEEDS:   No education needs have been identified at this time  Skin:  Skin Assessment: Reviewed RN Assessment  Last BM:  11/22  Height:   Ht Readings from Last 1 Encounters:  05/20/20 5' 6.5" (1.689 m)    Weight:   Wt Readings from Last 1 Encounters:  05/20/20 121 kg    BMI:  Body mass index is 42.41 kg/m.  Estimated Nutritional  Needs:   Kcal:  2100-2300  Protein:  130-150 grams  Fluid:  >2L/d    Larkin Ina, MS, RD, LDN RD pager number and weekend/on-call pager number located in [place].

## 2020-05-25 NOTE — Progress Notes (Signed)
Physical Therapy Treatment Patient Details Name: [name] MRN: [id number] DOB: 06/13/1946 Today's Date: 05/25/2020    History of Present Illness 74 y.o. female with medical history significant for morbid obesity, congestive heart failure, coronary artery disease status post stent angioplasty, history of A. fib status post ablation on chronic anticoagulation therapy with Eliquis, pulmonary hypertension status post pacemaker insertion who presents to the ER on 11/17 for worsening shortness of breath. Workup for acute on chronic diastolic heart failure, RSV.    PT Comments    Patient received in bed, resting. Agrees to PT session. Patient is mod independent with bed mobility. Transfers with mod independence. SPT to recliner with supervision. She ambulated 200 feet with 6 lpm O2. Sats dropped down to 86%. After brief standing rest with PLB she is able to recover back to mid 90%s. Patient will continue to benefit from skilled PT while here to improve functional independence and improve activity tolerance.       Follow Up Recommendations  No PT follow up;Supervision for mobility/OOB     Equipment Recommendations  None recommended by PT    Recommendations for Other Services       Precautions / Restrictions Precautions Precaution Comments: mod fall Restrictions Weight Bearing Restrictions: No    Mobility  Bed Mobility Overal bed mobility: Modified Independent Bed Mobility: Supine to Sit     Supine to sit: Modified independent (Device/Increase time);HOB elevated        Transfers Overall transfer level: Modified independent Equipment used: Rolling walker (2 wheeled) Transfers: Sit to/from [facility] Sit to Stand: Modified independent (Device/Increase time) Stand pivot transfers: Supervision       General transfer comment: VCs hand placement  Ambulation/Gait Ambulation/Gait assistance: Supervision Gait Distance (Feet): 200 Feet Assistive device: Rolling  walker (2 wheeled) Gait Pattern/deviations: Step-through pattern Gait velocity: decr   General Gait Details: supervision for safety, increased time and standing rest breaks as needed to recover O2 saturations down to 86% on 6 lpm after ambulating 100 feet.   Stairs             Wheelchair Mobility    Modified Rankin (Stroke Patients Only)       Balance Overall balance assessment: Modified Independent                                          Cognition Arousal/Alertness: Awake/alert Behavior During Therapy: WFL for tasks assessed/performed Overall Cognitive Status: Within Functional Limits for tasks assessed                                        Exercises      General Comments        Pertinent Vitals/Pain Pain Assessment: No/denies pain    Home Living                      Prior Function            PT Goals (current goals can now be found in the care plan section) Acute Rehab PT Goals Patient Stated Goal: to breathe better so I can walk in the parks PT Goal Formulation: With patient Time For Goal Achievement: 06/03/20 Potential to Achieve Goals: Good Progress towards PT goals: Progressing toward goals    Frequency  Min 3X/week      PT Plan Current plan remains appropriate    Co-evaluation              AM-PAC PT "6 Clicks" Mobility   Outcome Measure  Help needed turning from your back to your side while in a flat bed without using bedrails?: None Help needed moving from lying on your back to sitting on the side of a flat bed without using bedrails?: None Help needed moving to and from a bed to a chair (including a wheelchair)?: A Little Help needed standing up from a chair using your arms (e.g., wheelchair or bedside chair)?: A Little Help needed to walk in hospital room?: A Little Help needed climbing 3-5 steps with a railing? : A Little 6 Click Score: 20    End of Session Equipment  Utilized During Treatment: Gait belt;Oxygen Activity Tolerance: Patient tolerated treatment well Patient left: with call bell/phone within reach;in chair Nurse Communication: Mobility status PT Visit Diagnosis: Other abnormalities of gait and mobility (R26.89);Difficulty in walking, not elsewhere classified (R26.2)     Time: 0141-0301 PT Time Calculation (min) (ACUTE ONLY): 27 min  Charges:  $Gait Training: 23-37 mins                     Liberti Appleton, PT, GCS 05/25/20,10:14 AM

## 2020-05-25 NOTE — Progress Notes (Signed)
PT refused cpap for the night. 

## 2020-05-25 NOTE — Progress Notes (Addendum)
PROGRESS NOTE    [name]  FUX:[id number] DOB: [birth year] DOA: 05/19/2020 PCP: [name], MD    Brief Narrative:  [name] was admitted to the hospital with the working diagnosis of acute hypoxic respiratory failurerelated to acute diastolic heart failuredecompensation complicated withatrial fibrillation with RVR in the setting of acute bronchitis/ viral pneumonia due to RSV.  74 year old female past medical history for obesity class III, diastolic heart failure, coronary artery disease status post angioplasty, atrial fibrillation, pulmonary hypertension, who presented with dyspnea. Patient reported 5 days of dry cough, refractory to outpatient management with oral antibiotics. She had persistent and worsening symptoms that prompted her to come to the hospital.On her initial physical examination her oximetry was 82% on 6 L supplemental oxygen, blood pressure 122/58, heart rate 93, respiratory rate 30, oxygen saturation 90%,she had coarse breath sounds bilaterally, diffuse wheezing and rhonchi, heart S1-S2, present, tachycardic, abdomen soft, no lower extremity edema.  Sodium 136, potassium 3.1, chloride 88, bicarb 31, glucose one 68, BUN 27, creatinine 1.52, troponin I 20, white count 8.2, hemoglobin 14.9, hematocrit 45.8, platelets 215. Respiratory panel positive for RSV. SARS COVID-19 negative. Chest radiograph with congested hilar vasculature bilaterally. No infiltrates. Chest CT with bilateral groundglass opacities. EKG 98 bpm, normal axis, first-degree AV block, sinus rhythm, Q waves in V1-V2, no significant ST segment or T wave changes.  Patient was placed on supplemental 02 per non re-breather and broad spectrum antibiotic therapy,  Developed atrial fibrillation with RVR and place on IV diltiazem.  Started on aggressive diuresis with IV furosemide.  Transitioned to oral AV blockade, added metoprolol for better rate control.  Old records personally  reviewed: She follows with Dr. Cheryln Manly as outpatient, has diagnosis ofd tachybradycardia syndrome sp pacemaker (Ola dual chamber), sp ablation x3 for her atrial fibrillation (last on 09/20). Plan to repeat ablation.  Continue slow recovery, with improving symptoms and decreasing oxygen requirements.    Assessment & Plan:   Principal Problem:   Acute respiratory failure (HCC) Active Problems:   Essential hypertension   CAD S/P percutaneous coronary angioplasty   Paroxysmal atrial fibrillation (HCC)   Pulmonary hypertension (HCC)   Severe obesity (BMI >= 40) (HCC)   Hyperlipidemia   Persistent atrial fibrillation (HCC)   Atypical atrial flutter (HCC)   Hypotension   Hypokalemia   Type 2 diabetes mellitus with hyperlipidemia ([place])   1. Acute hypoxemic respiratory failure due to decompensated diastolic heart failure with acute cardiogenic pulmonary edema.Pulmonary hypertension.  Echocardiogram with preserved LV systolic function EF 55 to 60%, elevated PA pressure 63 mmHg systolic.  Stable oxygenation at 93% on 5 L/min per HFNC.   Continue diuresis with torsemide 40 mg daily to keep negative fluid balance, continue with strict in and out monitoring.   2. Atrial fibrillation with rapid ventricular response.patient has converted to 2:1 flutter, rate in the 70's.   Continue rate control with metoprolol 50 mg bid,diltiazem 360 mg and dofetilide. Continue antithrombotic therapy with apixaban.   Check EKG 12 lead today.   3. RSV bronchitis/ viral pneumonia (present on admission). improving submandibular painful lymphadenopathy. Continue to have mild expiratory wheezing.  Improving oxygenation.   On inhaled LABA and inhaled corticosteroids. Encourage out of bed to chair tid with meals, pt and ot.  Antitussive agents and airway clearing techniques with flutter valve and incentive spirometer.  Nasal fluticasone bid.   4. T2DMuncontrolled with hyperglycemia/  dyslipidemia. Improved po intake, fasting glucose is 179 mg/dl, capillary 156, 209, 144.  Tolerating  well basal insulin 10 units of glargine and insulin sliding scale.   Continue with atorvastatin.   5. AKI with Hypokalemia/ hyperkalemia with metabolic alkalosiscr continue to trend down, today at 1,14 with K at 4,0 and bicarbonate at 36.  Continue current diuretic regimen with torsemide 40 mg daily, to keep negative fluid balance.   6. Depression/ obesity class 3.continue with duloxetine. BMI calculated at 42. Plan to resume PT/OT today.    Status is: Inpatient  Remains inpatient appropriate because:Inpatient level of care appropriate due to severity of illness   Dispo: The patient is from: Home              Anticipated d/c is to: Home              Anticipated d/c date is: 2 days              Patient currently is not medically stable to d/c.   DVT prophylaxis: apixaban   Code Status:    full  Family Communication:  I spoke with patient's husband at the bedside, we talked in detail about patient's condition, plan of care and prognosis and all questions were addressed.      Nutrition Status: Nutrition Problem: Increased nutrient needs Etiology: acute illness Signs/Symptoms: estimated needs Interventions: Glucerna shake, MVI, Prostat     Consultants:   Cardiology   Pulmonary     Subjective: Patient continue to have improving symptoms, decreased sore throat, but continue to have cough and wheezing, worsening dyspnea on exertion. No nausea or vomiting and tolerating po well.   Objective: Vitals:   05/24/20 1213 05/24/20 2119 05/25/20 0345 05/25/20 0809  BP: 121/69 (!) 105/50 (!) 146/78   Pulse: 91 90 74   Resp: 16 18 16    Temp: 98.7 F (37.1 C) 97.6 F (36.4 C) (!) 97.5 F (36.4 C)   TempSrc: Oral Oral Oral   SpO2: 94% 90% 98% 93%  Weight:      Height:        Intake/Output Summary (Last 24 hours) at 05/25/2020 9562 Last data filed at 05/24/2020  1311 Gross per 24 hour  Intake 6 ml  Output --  Net 6 ml   Filed Weights   05/20/20 0255  Weight: 121 kg    Examination:   General: deconditioned  Neurology: Awake and alert, non focal  E ENT: no pallor, no icterus, oral mucosa moist Cardiovascular: No JVD. S1-S2 present, rhythmic, no gallops, rubs, or murmurs. No lower extremity edema. Pulmonary: positive breath sounds bilaterally, decreased air movement, positive expiratory wheezing, with no rhonchi or rales. Gastrointestinal. Abdomen soft and non tender, protuberant.  Skin. No rashes Musculoskeletal: no joint deformities     Data Reviewed: I have personally reviewed following labs and imaging studies  CBC: Recent Labs  Lab 05/19/20 1545 05/19/20 1946 05/21/20 0216  WBC  --  8.2 11.5*  NEUTROABS  --   --  8.4*  HGB 15.6* 14.9 14.9  HCT 46.0 45.8 46.4*  MCV  --  94.4 96.5  PLT  --  215 130   Basic Metabolic Panel: Recent Labs  Lab 05/19/20 1946 05/20/20 0133 05/21/20 0216 05/22/20 0320 05/23/20 0142 05/24/20 0313 05/25/20 0333  NA  --    < > 142 138 138 139 138  K  --    < > 4.7 4.0 3.2* 5.6* 4.0  CL  --    < > 93* 87* 88* 91* 90*  CO2  --    < >  36* 41* 42* 38* 36*  GLUCOSE  --    < > 256* 204* 285* 194* 179*  BUN  --    < > 32* 32* 32* 33* 29*  CREATININE  --    < > 1.53* 1.24* 1.29* 1.16* 1.14*  CALCIUM  --    < > 9.6 9.5 9.5 9.7 9.6  MG 2.1  --  2.3  --  2.3  --   --    < > = values in this interval not displayed.   GFR: Estimated Creatinine Clearance: 57.9 mL/min (A) (by C-G formula based on SCr of 1.14 mg/dL (H)). Liver Function Tests: No results for input(s): AST, ALT, ALKPHOS, BILITOT, PROT, ALBUMIN in the last 168 hours. No results for input(s): LIPASE, AMYLASE in the last 168 hours. No results for input(s): AMMONIA in the last 168 hours. Coagulation Profile: No results for input(s): INR, PROTIME in the last 168 hours. Cardiac Enzymes: No results for input(s): CKTOTAL, CKMB, CKMBINDEX,  TROPONINI in the last 168 hours. BNP (last 3 results) No results for input(s): PROBNP in the last 8760 hours. HbA1C: No results for input(s): HGBA1C in the last 72 hours. CBG: Recent Labs  Lab 05/24/20 0731 05/24/20 1122 05/24/20 1729 05/24/20 2115 05/25/20 0732  GLUCAP 235* 145* 156* 209* 144*   Lipid Profile: No results for input(s): CHOL, HDL, LDLCALC, TRIG, CHOLHDL, LDLDIRECT in the last 72 hours. Thyroid Function Tests: No results for input(s): TSH, T4TOTAL, FREET4, T3FREE, THYROIDAB in the last 72 hours. Anemia Panel: No results for input(s): VITAMINB12, FOLATE, FERRITIN, TIBC, IRON, RETICCTPCT in the last 72 hours.    Radiology Studies: I have reviewed all of the imaging during this hospital visit personally     Scheduled Meds: . (feeding supplement) PROSource Plus  30 mL Oral BID BM  . acetaminophen  650 mg Oral Q6H  . apixaban  5 mg Oral BID  . arformoterol  15 mcg Nebulization BID  . vitamin C  1,000 mg Oral QHS  . atorvastatin  40 mg Oral Daily  . budesonide (PULMICORT) nebulizer solution  2 mg Nebulization BID  . calcium-vitamin D  1 tablet Oral Daily  . chlorpheniramine-HYDROcodone  5 mL Oral Q12H  . diltiazem  360 mg Oral Daily  . dofetilide  250 mcg Oral BID  . DULoxetine  60 mg Oral Daily  . feeding supplement (GLUCERNA SHAKE)  237 mL Oral TID BM  . fluticasone  1 spray Each Nare BID  . insulin aspart  0-20 Units Subcutaneous TID WC  . insulin glargine  10 Units Subcutaneous Daily  . loratadine  10 mg Oral Daily  . metoprolol tartrate  50 mg Oral BID  . multivitamin with minerals  1 tablet Oral Daily  . pantoprazole  40 mg Oral Daily  . sodium chloride flush  3 mL Intravenous Q12H  . torsemide  40 mg Oral Daily  . vitamin B-12  5,000 mcg Oral QHS  . Vitamin E  800 Units Oral QHS   And  . vitamin E  200 Units Oral QHS   Continuous Infusions: . sodium chloride       LOS: 6 days        Jamani Eley Gerome Apley, MD

## 2020-05-26 ENCOUNTER — Inpatient Hospital Stay (HOSPITAL_COMMUNITY): Payer: PPO

## 2020-05-26 DIAGNOSIS — I10 Essential (primary) hypertension: Secondary | ICD-10-CM | POA: Diagnosis not present

## 2020-05-26 DIAGNOSIS — R410 Disorientation, unspecified: Secondary | ICD-10-CM

## 2020-05-26 DIAGNOSIS — I2510 Atherosclerotic heart disease of native coronary artery without angina pectoris: Secondary | ICD-10-CM | POA: Diagnosis not present

## 2020-05-26 DIAGNOSIS — I484 Atypical atrial flutter: Secondary | ICD-10-CM | POA: Diagnosis not present

## 2020-05-26 DIAGNOSIS — J9601 Acute respiratory failure with hypoxia: Secondary | ICD-10-CM | POA: Diagnosis not present

## 2020-05-26 LAB — GLUCOSE, CAPILLARY
Glucose-Capillary: 154 mg/dL — ABNORMAL HIGH (ref 70–99)
Glucose-Capillary: 171 mg/dL — ABNORMAL HIGH (ref 70–99)
Glucose-Capillary: 202 mg/dL — ABNORMAL HIGH (ref 70–99)
Glucose-Capillary: 176 mg/dL — ABNORMAL HIGH (ref 70–99)

## 2020-05-26 LAB — BASIC METABOLIC PANEL
Anion gap: 11 (ref 5–15)
BUN: 27 mg/dL — ABNORMAL HIGH (ref 8–23)
CO2: 37 mmol/L — ABNORMAL HIGH (ref 22–32)
Calcium: 9.7 mg/dL (ref 8.9–10.3)
Chloride: 90 mmol/L — ABNORMAL LOW (ref 98–111)
Creatinine, Ser: 1.27 mg/dL — ABNORMAL HIGH (ref 0.44–1.00)
GFR, Estimated: 44 mL/min — ABNORMAL LOW (ref >60)
Glucose, Bld: 139 mg/dL — ABNORMAL HIGH (ref 70–99)
Potassium: 3.8 mmol/L (ref 3.5–5.1)
Sodium: 138 mmol/L (ref 135–145)

## 2020-05-26 MED ORDER — POTASSIUM CHLORIDE CRYS ER 20 MEQ PO TBCR
40.0000 meq | EXTENDED_RELEASE_TABLET | Freq: Once | ORAL | Status: AC
  Administered 2020-05-26: 40 meq via ORAL
  Filled 2020-05-26: qty 2

## 2020-05-26 MED ORDER — GUAIFENESIN ER 600 MG PO TB12
1200.0000 mg | ORAL_TABLET | Freq: Two times a day (BID) | ORAL | Status: DC
  Administered 2020-05-26 – 2020-06-02 (×15): 1200 mg via ORAL
  Filled 2020-05-26 (×15): qty 2

## 2020-05-26 MED ORDER — BENZONATATE 100 MG PO CAPS
100.0000 mg | ORAL_CAPSULE | Freq: Three times a day (TID) | ORAL | Status: DC | PRN
  Administered 2020-05-27 – 2020-06-01 (×5): 100 mg via ORAL
  Filled 2020-05-26 (×5): qty 1

## 2020-05-26 MED ORDER — INSULIN GLARGINE 100 UNIT/ML ~~LOC~~ SOLN
8.0000 [IU] | Freq: Every day | SUBCUTANEOUS | Status: DC
  Administered 2020-05-27 – 2020-05-28 (×2): 8 [IU] via SUBCUTANEOUS
  Filled 2020-05-26 (×2): qty 0.08

## 2020-05-26 NOTE — Progress Notes (Signed)
Patients calls back angry on the phone asking if any testing has been completed for his wife.  I explained that a CT of the head had been ordered but not completed at this time.  He stated "well I will be at the hospital at 8pm, I will be staying the night and I demand to speak to a doctor to find out what is going on with my wife".  I explained to the husband that I would provide the doctor with his phone number to see if she can call him and I also explained the visiting hours.

## 2020-05-26 NOTE — Plan of Care (Signed)
  Problem: Education: Goal: Knowledge of General Education information will improve Description: Including pain rating scale, medication(s)/side effects and non-pharmacologic comfort measures Outcome: Not Progressing   Problem: Health Behavior/Discharge Planning: Goal: Ability to manage health-related needs will improve Outcome: Not Progressing   Problem: Clinical Measurements: Goal: Ability to maintain clinical measurements within normal limits will improve Outcome: Not Progressing Goal: Will remain free from infection Outcome: Not Progressing Goal: Diagnostic test results will improve Outcome: Not Progressing Goal: Respiratory complications will improve Outcome: Not Progressing Goal: Cardiovascular complication will be avoided Outcome: Not Progressing   Problem: Nutrition: Goal: Adequate nutrition will be maintained Outcome: Not Progressing   Problem: Coping: Goal: Level of anxiety will decrease Outcome: Not Progressing   Problem: Elimination: Goal: Will not experience complications related to bowel motility Outcome: Not Progressing Goal: Will not experience complications related to urinary retention Outcome: Not Progressing   

## 2020-05-26 NOTE — Progress Notes (Signed)
Physical Therapy Treatment Patient Details Name: [name] MRN: [id number] DOB: December 23, 1945 Today's Date: 05/26/2020    History of Present Illness 74 y.o. female with medical history significant for morbid obesity, congestive heart failure, coronary artery disease status post stent angioplasty, history of A. fib status post ablation on chronic anticoagulation therapy with Eliquis, pulmonary hypertension status post pacemaker insertion who presents to the ER on 11/17 for worsening shortness of breath. Workup for acute on chronic diastolic heart failure, RSV.    PT Comments    Pt is up to stand with O2 sats declining to 88% quickly.  Worked on strengthening sitting and standing as she is not sustaining standing long enough to walk.  Pt is not complaining of symptoms with exertion, but is feeling generally tired at baseline today.  Follow up acutely to make progress with gait, strength and control of standing balance.  Will anticipate follow up with PCP at dc.  Follow Up Recommendations  No PT follow up;Supervision for mobility/OOB     Equipment Recommendations  None recommended by PT    Recommendations for Other Services       Precautions / Restrictions Precautions Precautions: Fall Precaution Comments: fall risk Restrictions Weight Bearing Restrictions: No    Mobility  Bed Mobility Overal bed mobility: Modified Independent Bed Mobility: Supine to Sit;Sit to Supine     Supine to sit: Modified independent (Device/Increase time);HOB elevated Sit to supine: Modified independent (Device/Increase time)      Transfers Overall transfer level: Modified independent Equipment used: Rolling walker (2 wheeled) Transfers: Sit to/from Stand Sit to Stand: Modified independent (Device/Increase time)         General transfer comment: reminders for safety and watching sats  Ambulation/Gait             General Gait Details: sats were 89% when PT arrived and dropped with  standing ex's   Stairs             Wheelchair Mobility    Modified Rankin (Stroke Patients Only)       Balance Overall balance assessment: Modified Independent                                          Cognition Arousal/Alertness: Awake/alert Behavior During Therapy: WFL for tasks assessed/performed Overall Cognitive Status: Within Functional Limits for tasks assessed                                        Exercises General Exercises - Lower Extremity Ankle Circles/Pumps: AROM;5 reps Long Arc Quad: Strengthening;10 reps Heel Slides: Strengthening;10 reps Hip ABduction/ADduction: AROM;Strengthening;10 reps Hip Flexion/Marching: AROM;10 reps Toe Raises: Strengthening;10 reps Mini-Sqauts: Strengthening;10 reps (forward and posterior leg lifts)    General Comments General comments (skin integrity, edema, etc.): O2 sats were too low with standing to venture on a walk.        Pertinent Vitals/Pain Pain Assessment: No/denies pain    Home Living                      Prior Function            PT Goals (current goals can now be found in the care plan section) Acute Rehab PT Goals Patient Stated Goal: to breathe better so I can walk  in the parks Progress towards PT goals: Not progressing toward goals - comment    Frequency    Min 3X/week      PT Plan Current plan remains appropriate    Co-evaluation              AM-PAC PT "6 Clicks" Mobility   Outcome Measure  Help needed turning from your back to your side while in a flat bed without using bedrails?: None Help needed moving from lying on your back to sitting on the side of a flat bed without using bedrails?: None Help needed moving to and from a bed to a chair (including a wheelchair)?: None Help needed standing up from a chair using your arms (e.g., wheelchair or bedside chair)?: A Little Help needed to walk in hospital room?: A Little Help needed  climbing 3-5 steps with a railing? : A Little 6 Click Score: 21    End of Session Equipment Utilized During Treatment: Gait belt;Oxygen Activity Tolerance: Treatment limited secondary to medical complications (Comment) Patient left: with call bell/phone within reach;in chair Nurse Communication: Mobility status PT Visit Diagnosis: Other abnormalities of gait and mobility (R26.89);Difficulty in walking, not elsewhere classified (R26.2)     Time: 1595-3967 PT Time Calculation (min) (ACUTE ONLY): 25 min  Charges:  $Therapeutic Exercise: 8-22 mins $Therapeutic Activity: 8-22 mins                    Ramond Dial 05/26/2020, 1:19 PM  Mee Hives, PT MS Acute Rehab Dept. Number: [place]

## 2020-05-26 NOTE — Progress Notes (Signed)
Patients husband calling concerned.  He stated the patient seems very confused to him.  He stated he spoke with her earlier today and she kept tell;ing him she was staying at a hotel and she needed more money to stay more nights. Patient has been alert and oriented x4 for this nurse but she does start talking about all kinds of different subjects while I am in the room.  We were talking about how she was feeling and her response to me was "Is that your family out there in the hallway with you that you have been talking to?" Then she would ask "how come I have to stay in this hotel".  I attempted to explain to the patient that she was in the hospital and she replied "I think I'm just confused from all the cough medicine I'm taking".   Dr. Benny Lennert contacted, CT of head has been ordered.

## 2020-05-26 NOTE — Progress Notes (Signed)
PROGRESS NOTE  [name] ZOX:[id number] DOB: [birth year] DOA: 05/19/2020 PCP: [name], MD  Brief History   [name] was admitted to the hospital with the working diagnosis of acute hypoxic respiratory failurerelated to acute diastolic heart failuredecompensation complicated withatrial fibrillation with RVR in the setting of acute bronchitis/ viral pneumonia due to RSV.  74 year old female past medical history for obesity class III, diastolic heart failure, coronary artery disease status post angioplasty, atrial fibrillation, pulmonary hypertension, who presented with dyspnea. Patient reported 5 days of dry cough, refractory to outpatient management with oral antibiotics. She had persistent and worsening symptoms that prompted her to come to the hospital.On her initial physical examination her oximetry was 82% on 6 L supplemental oxygen, blood pressure 122/58, heart rate 93, respiratory rate 30, oxygen saturation 90%,she had coarse breath sounds bilaterally, diffuse wheezing and rhonchi, heart S1-S2, present, tachycardic, abdomen soft, no lower extremity edema.  Sodium 136, potassium 3.1, chloride 88, bicarb 31, glucose one 68, BUN 27, creatinine 1.52, troponin I 20, white count 8.2, hemoglobin 14.9, hematocrit 45.8, platelets 215. Respiratory panel positive for RSV. SARS COVID-19 negative. Chest radiograph with congested hilar vasculature bilaterally. No infiltrates. Chest CT with bilateral groundglass opacities. EKG 98 bpm, normal axis, first-degree AV block, sinus rhythm, Q waves in V1-V2, no significant ST segment or T wave changes.  Patient was placed on supplemental 02 per non re-breather and broad spectrum antibiotic therapy,  Developed atrial fibrillation with RVR and place on IV diltiazem.  Started on aggressive diuresis with IV furosemide.  Transitioned to oral AV blockade, added metoprolol for better rate control.  Old records personally reviewed: She  follows with Dr. Cheryln Manly as outpatient, has diagnosis ofd tachybradycardia syndrome sp pacemaker ([place] dual chamber), sp ablation x3 for her atrial fibrillation (last on 09/20). Plan to repeat ablation.  Continue slow recovery, with improving symptoms and decreasing oxygen requirements.   Pt with confusion and agitation last night and in the early morning hours. Delirum causing medications have been stopped. CT head is pending. No obvious metabolic cause for this change. Likely delirium. I have placed an order in support of the patient's husband staying overnight. I have discussed the patient in detail with her husband. All questions answered to the best of my ability.  Consultants  . PCCM . Cardiology  Procedures  . None  Antibiotics   Anti-infectives (From admission, onward)   Start     Dose/Rate Route Frequency Ordered Stop   05/20/20 1500  cefTRIAXone (ROCEPHIN) 1 g in sodium chloride 0.9 % 100 mL IVPB  Status:  Discontinued        1 g 200 mL/hr over 30 Minutes Intravenous Every 24 hours 05/19/20 1831 05/20/20 1607   05/20/20 1000  doxycycline (VIBRAMYCIN) 100 mg in sodium chloride 0.9 % 250 mL IVPB  Status:  Discontinued        100 mg 125 mL/hr over 120 Minutes Intravenous Every 12 hours 05/19/20 1838 05/20/20 1607   05/20/20 0000  azithromycin (ZITHROMAX) 500 mg in sodium chloride 0.9 % 250 mL IVPB  Status:  Discontinued        500 mg 250 mL/hr over 60 Minutes Intravenous Every 24 hours 05/19/20 1831 05/19/20 1838   05/19/20 1515  cefTRIAXone (ROCEPHIN) 1 g in sodium chloride 0.9 % 100 mL IVPB        1 g 200 mL/hr over 30 Minutes Intravenous  Once 05/19/20 1506 05/19/20 1620   05/19/20 1515  azithromycin (ZITHROMAX) 500 mg in  sodium chloride 0.9 % 250 mL IVPB        500 mg 250 mL/hr over 60 Minutes Intravenous  Once 05/19/20 1506 05/19/20 1733    .  Subjective  The patient is resting comfortably. She is complaining of cough. She has been started on guaifenesin and  given a flutter valve to help her with her cough. Robitussin DM and tussionex have been stopped as them may have contributed to her delirium.  Objective   Vitals:  Vitals:   05/26/20 0805 05/26/20 1407  BP:  (!) 119/57  Pulse:  72  Resp:  18  Temp:  98.2 F (36.8 C)  SpO2: 97% 93%    Exam:  Constitutional:  . The patient is awake, alert, and oriented x 3. No acute distress. Eyes:  . pupils and irises appear normal . Normal lids and conjunctivae ENMT:  . grossly normal hearing  . Lips appear normal . external ears, nose appear normal . Oropharynx: mucosa, tongue,posterior pharynx appear normal Neck:  . neck appears normal, no masses, normal ROM, supple . no thyromegaly Respiratory:  . No increased work of breathing. . No wheezes, rales, or rhonchi . No tactile fremitus Cardiovascular:  . Regular rate and rhythm . No murmurs, ectopy, or gallups. . No lateral PMI. No thrills. Abdomen:  . Abdomen is soft, non-tender, non-distended . No hernias, masses, or organomegaly . Normoactive bowel sounds.  Musculoskeletal:  . No cyanosis, clubbing, or edema Skin:  . No rashes, lesions, ulcers . palpation of skin: no induration or nodules Neurologic:  . CN 2-12 intact . Sensation all 4 extremities intact Psychiatric:  . Mental status o Mood, affect appropriate o Orientation to person, place, time  . judgment and insight appear intact  I have personally reviewed the following:   Today's Data  . Vitals, BMP  Micro Data  . Respiratory Viral Panel was positive for Parainfluenza Virus 4 and RSV.   Imaging  . CT chest without contrast: Mediastinal and probable bilateral hilar adenopathy unchanged since 2019. [name] Peribronchial thickeninc and areas of atelectasis and ground glass opacities throughout the upper lobes. Early bronchopneumonia in RUL and superior segment of right lower lobe possible. CAD and aorta atherosclerosis  Cardiology Data  . EKG: Atrial tachycardia  with 2:1 conduction.  Scheduled Meds: . (feeding supplement) PROSource Plus  30 mL Oral BID BM  . acetaminophen  650 mg Oral Q6H  . apixaban  5 mg Oral BID  . arformoterol  15 mcg Nebulization BID  . vitamin C  1,000 mg Oral QHS  . atorvastatin  40 mg Oral Daily  . budesonide (PULMICORT) nebulizer solution  2 mg Nebulization BID  . calcium-vitamin D  1 tablet Oral Daily  . diltiazem  360 mg Oral Daily  . dofetilide  250 mcg Oral BID  . DULoxetine  60 mg Oral Daily  . feeding supplement (GLUCERNA SHAKE)  237 mL Oral TID BM  . fluticasone  1 spray Each Nare BID  . guaiFENesin  1,200 mg Oral BID  . insulin aspart  0-20 Units Subcutaneous TID WC  . insulin glargine  10 Units Subcutaneous Daily  . loratadine  10 mg Oral Daily  . metoprolol tartrate  50 mg Oral BID  . multivitamin with minerals  1 tablet Oral Daily  . pantoprazole  40 mg Oral Daily  . sodium chloride flush  3 mL Intravenous Q12H  . torsemide  40 mg Oral Daily  . vitamin B-12  5,000 mcg Oral QHS  .  Vitamin E  800 Units Oral QHS   And  . vitamin E  200 Units Oral QHS   Continuous Infusions: . sodium chloride      Principal Problem:   Acute respiratory failure (HCC) Active Problems:   Essential hypertension   CAD S/P percutaneous coronary angioplasty   Paroxysmal atrial fibrillation (HCC)   Pulmonary hypertension (HCC)   Severe obesity (BMI >= 40) (HCC)   Hyperlipidemia   Persistent atrial fibrillation (HCC)   Atypical atrial flutter (HCC)   Hypotension   Hypokalemia   Type 2 diabetes mellitus with hyperlipidemia (HCC)   LOS: 7 days   A & P  Acute hypoxemic respiratory failure due to decompensated diastolic heart failure with acute cardiogenic pulmonary edema.Pulmonary hypertension. Echocardiogram with preserved LV systolic function EF 55 to 60%, elevated PA pressure 63 mmHg systolic.The patient is currently saturating at 96% on HFNC 6L. Wean as possible. Continue diuresis.   Atrial fibrillation with  rapid ventricular response: Thepatient has converted to 2:1 flutter, rate in the 70's. Continue telemetry. Continue rate control with metoprolol 50 mg bid,diltiazem 360 mg and dofetilide. Continue antithrombotic therapy with apixaban.  RSV bronchitis/ viral pneumonia (present on admission): Oxygenation status slow to improve.Improving submandibular painful lymphadenopathy. Continue to have mild expiratory wheezing.  The patient is being managed on inhaled LABA andinhaledcorticosteroids. Encourage out of bed to chair tid with meals, pt and ot. She is receiving nasal flunisolide as well as guaifenesin and a flutter valve. Robitussin DM and Tussionex stopped and Tessalon perles started in their place as them may be contributing to the patient's delirium. Incentive spirometer at bedside to be used 6x an hour while awake.   T2DMuncontrolled with hyperglycemia/ dyslipidemia. Hypoglycemia in the last 24 hours. Will reduce lantus. FSBS is followed with SSI. Will ask that the patient receives a bedtime snack.  Delirium/Sundowning: Patient spouse badly disturbed by a phone call from the patient this morning accusing him of abandoning her at a hotel without money or transportation. CT head ordered, although I doubt this as the patient has no lateralising signs. The patient was completely alert and oriented on my visit, although nursing states that she has come out with some non-sensical statements. Tussionex and Robitussin DM were discontinued as them may be contributing to her delirium. Will focus on mobilizing patient. I have also discussed with nursing and placed an order in support of her spouse spending the night to help keep her calm. Plan to resume PT/OT today.   Hyperlipidemia: Continue with atorvastatin.   AKI with Hypokalemia/hyperkalemia withmetabolic alkalosiscr continue to trend down, today at 1.27 with K at 3.8 and bicarbonate at 37.  Continue current diuretic regimen with torsemide 40  mg daily, to keep negative fluid balance.   Depression: continue withduloxetine.  Morbid Obesity Class III: Complicates all cares. BMI calculated at 42.  I have seen and examined this patient myself. I have spent 42 minutes in her evaluation and care. More than 50% of this was spent in counseling her spouse. All questions answered to the best of my ability.     DVT prophylaxis:      apixaban   Code Status:               full  Family Communication:       I have discussed the patient in detail with her husband. All questions answered to the best of my ability. Disposition:    Status is: Inpatient  Remains inpatient appropriate because:Inpatient level of care appropriate  due to severity of illness  Dispo: The patient is from: Home  Anticipated d/c is to: Home  Anticipated d/c date is: 2 days  Patient currently is not medically stable to d/c.  Kendra Woolford, DO [facility] Direct contact: see [email or web address]  7PM-7AM contact night coverage as above 05/26/2020, 7:01 PM  LOS: 7 days

## 2020-05-27 ENCOUNTER — Inpatient Hospital Stay (HOSPITAL_COMMUNITY): Payer: PPO

## 2020-05-27 DIAGNOSIS — I10 Essential (primary) hypertension: Secondary | ICD-10-CM | POA: Diagnosis not present

## 2020-05-27 DIAGNOSIS — I2510 Atherosclerotic heart disease of native coronary artery without angina pectoris: Secondary | ICD-10-CM | POA: Diagnosis not present

## 2020-05-27 DIAGNOSIS — I484 Atypical atrial flutter: Secondary | ICD-10-CM | POA: Diagnosis not present

## 2020-05-27 DIAGNOSIS — J9601 Acute respiratory failure with hypoxia: Secondary | ICD-10-CM | POA: Diagnosis not present

## 2020-05-27 LAB — GLUCOSE, CAPILLARY
Glucose-Capillary: 166 mg/dL — ABNORMAL HIGH (ref 70–99)
Glucose-Capillary: 202 mg/dL — ABNORMAL HIGH (ref 70–99)
Glucose-Capillary: 144 mg/dL — ABNORMAL HIGH (ref 70–99)
Glucose-Capillary: 188 mg/dL — ABNORMAL HIGH (ref 70–99)

## 2020-05-27 LAB — CBC WITH DIFFERENTIAL/PLATELET
Abs Immature Granulocytes: 0.07 10*3/uL (ref 0.00–0.07)
Basophils Absolute: 0.1 10*3/uL (ref 0.0–0.1)
Basophils Relative: 1 %
Eosinophils Absolute: 0.8 10*3/uL — ABNORMAL HIGH (ref 0.0–0.5)
Eosinophils Relative: 7 %
HCT: 44 % (ref 36.0–46.0)
Hemoglobin: 13.9 g/dL (ref 12.0–15.0)
Immature Granulocytes: 1 %
Lymphocytes Relative: 23 %
Lymphs Abs: 2.9 10*3/uL (ref 0.7–4.0)
MCH: 31 pg (ref 26.0–34.0)
MCHC: 31.6 g/dL (ref 30.0–36.0)
MCV: 98 fL (ref 80.0–100.0)
Monocytes Absolute: 1.2 10*3/uL — ABNORMAL HIGH (ref 0.1–1.0)
Monocytes Relative: 9 %
Neutro Abs: 7.7 10*3/uL (ref 1.7–7.7)
Neutrophils Relative %: 59 %
Platelets: 288 10*3/uL (ref 150–400)
RBC: 4.49 MIL/uL (ref 3.87–5.11)
RDW: 13.1 % (ref 11.5–15.5)
WBC: 12.7 10*3/uL — ABNORMAL HIGH (ref 4.0–10.5)
nRBC: 0 % (ref 0.0–0.2)

## 2020-05-27 LAB — BASIC METABOLIC PANEL
Anion gap: 13 (ref 5–15)
BUN: 27 mg/dL — ABNORMAL HIGH (ref 8–23)
CO2: 36 mmol/L — ABNORMAL HIGH (ref 22–32)
Calcium: 10.2 mg/dL (ref 8.9–10.3)
Chloride: 92 mmol/L — ABNORMAL LOW (ref 98–111)
Creatinine, Ser: 1.39 mg/dL — ABNORMAL HIGH (ref 0.44–1.00)
GFR, Estimated: 40 mL/min — ABNORMAL LOW (ref >60)
Glucose, Bld: 158 mg/dL — ABNORMAL HIGH (ref 70–99)
Potassium: 4.8 mmol/L (ref 3.5–5.1)
Sodium: 141 mmol/L (ref 135–145)

## 2020-05-27 MED ORDER — METHYLPREDNISOLONE SODIUM SUCC 125 MG IJ SOLR
60.0000 mg | Freq: Every day | INTRAMUSCULAR | Status: DC
  Administered 2020-05-27 – 2020-05-31 (×5): 60 mg via INTRAVENOUS
  Filled 2020-05-27 (×5): qty 2

## 2020-05-27 MED ORDER — SALINE SPRAY 0.65 % NA SOLN
2.0000 | Freq: Four times a day (QID) | NASAL | Status: DC
  Administered 2020-05-27 – 2020-06-02 (×23): 2 via NASAL
  Filled 2020-05-27 (×2): qty 44

## 2020-05-27 MED ORDER — TORSEMIDE 20 MG PO TABS
40.0000 mg | ORAL_TABLET | Freq: Two times a day (BID) | ORAL | Status: DC
  Administered 2020-05-27 – 2020-06-02 (×12): 40 mg via ORAL
  Filled 2020-05-27 (×13): qty 2

## 2020-05-27 NOTE — Plan of Care (Signed)
  Problem: Nutrition: Goal: Adequate nutrition will be maintained Outcome: Progressing   

## 2020-05-27 NOTE — Progress Notes (Signed)
PROGRESS NOTE  [name] LNL:[id number] DOB: 1945/09/12 DOA: 05/19/2020 PCP: [name], MD  Brief History   [name] was admitted to the hospital with the working diagnosis of acute hypoxic respiratory failurerelated to acute diastolic heart failuredecompensation complicated withatrial fibrillation with RVR in the setting of acute bronchitis/ viral pneumonia due to RSV.  74 year old female past medical history for obesity class III, diastolic heart failure, coronary artery disease status post angioplasty, atrial fibrillation, pulmonary hypertension, who presented with dyspnea. Patient reported 5 days of dry cough, refractory to outpatient management with oral antibiotics. She had persistent and worsening symptoms that prompted her to come to the hospital.On her initial physical examination her oximetry was 82% on 6 L supplemental oxygen, blood pressure 122/58, heart rate 93, respiratory rate 30, oxygen saturation 90%,she had coarse breath sounds bilaterally, diffuse wheezing and rhonchi, heart S1-S2, present, tachycardic, abdomen soft, no lower extremity edema.  Sodium 136, potassium 3.1, chloride 88, bicarb 31, glucose one 68, BUN 27, creatinine 1.52, troponin I 20, white count 8.2, hemoglobin 14.9, hematocrit 45.8, platelets 215. Respiratory panel positive for RSV. SARS COVID-19 negative. Chest radiograph with congested hilar vasculature bilaterally. No infiltrates. Chest CT with bilateral groundglass opacities. EKG 98 bpm, normal axis, first-degree AV block, sinus rhythm, Q waves in V1-V2, no significant ST segment or T wave changes.  Patient was placed on supplemental 02 per non re-breather and broad spectrum antibiotic therapy,  Developed atrial fibrillation with RVR and place on IV diltiazem.  Started on aggressive diuresis with IV furosemide.  Transitioned to oral AV blockade, added metoprolol for better rate control.  Old records personally reviewed: She  follows with Dr. Cheryln Manly as outpatient, has diagnosis ofd tachybradycardia syndrome sp pacemaker ([place] dual chamber), sp ablation x3 for her atrial fibrillation (last on 09/20). Plan to repeat ablation.  Continue slow recovery, with improving symptoms and decreasing oxygen requirements.   Pt with confusion and agitation last night and in the early morning hours. Delirum causing medications have been stopped. CT head is pending. No obvious metabolic cause for this change. Likely delirium. I have placed an order in support of the patient's husband staying overnight. I have discussed the patient in detail with her husband. All questions answered to the best of my ability.  Consultants  . PCCM . Cardiology  Procedures  . None  Antibiotics   Anti-infectives (From admission, onward)   Start     Dose/Rate Route Frequency Ordered Stop   05/20/20 1500  cefTRIAXone (ROCEPHIN) 1 g in sodium chloride 0.9 % 100 mL IVPB  Status:  Discontinued        1 g 200 mL/hr over 30 Minutes Intravenous Every 24 hours 05/19/20 1831 05/20/20 1607   05/20/20 1000  doxycycline (VIBRAMYCIN) 100 mg in sodium chloride 0.9 % 250 mL IVPB  Status:  Discontinued        100 mg 125 mL/hr over 120 Minutes Intravenous Every 12 hours 05/19/20 1838 05/20/20 1607   05/20/20 0000  azithromycin (ZITHROMAX) 500 mg in sodium chloride 0.9 % 250 mL IVPB  Status:  Discontinued        500 mg 250 mL/hr over 60 Minutes Intravenous Every 24 hours 05/19/20 1831 05/19/20 1838   05/19/20 1515  cefTRIAXone (ROCEPHIN) 1 g in sodium chloride 0.9 % 100 mL IVPB        1 g 200 mL/hr over 30 Minutes Intravenous  Once 05/19/20 1506 05/19/20 1620   05/19/20 1515  azithromycin (ZITHROMAX) 500 mg in  sodium chloride 0.9 % 250 mL IVPB        500 mg 250 mL/hr over 60 Minutes Intravenous  Once 05/19/20 1506 05/19/20 1733     Subjective  The patient is resting comfortably. Her spouse is at bedside and feels that patient is much improved in terms  of mental status from yesterday. However her oxygen requirements are greatly increased.  Objective   Vitals:  Vitals:   05/27/20 0840 05/27/20 0841  BP:    Pulse:    Resp:    Temp:    SpO2: 92% 92%    Exam:  Constitutional:  . The patient is awake, alert, and oriented x 3. No acute distress. Respiratory:  . No increased work of breathing. . No wheezes or rhonchi . Positive for rales at bases . No tactile fremitus Cardiovascular:  . Regular rate and rhythm . No murmurs, ectopy, or gallups. . No lateral PMI. No thrills. Abdomen:  . Abdomen is soft, non-tender, non-distended . No hernias, masses, or organomegaly . Normoactive bowel sounds.  Musculoskeletal:  . No cyanosis, clubbing, or edema Skin:  . No rashes, lesions, ulcers . palpation of skin: no induration or nodules Neurologic:  . CN 2-12 intact . Sensation all 4 extremities intact Psychiatric:  . Mental status o Mood, affect appropriate o Orientation to person, place, time  . judgment and insight appear intact  I have personally reviewed the following:   Today's Data  . Vitals, BMP  Micro Data  . Respiratory Viral Panel was positive for Parainfluenza Virus 4 and RSV.   Imaging  . CT chest without contrast: Mediastinal and probable bilateral hilar adenopathy unchanged since 2019. [name] Peribronchial thickeninc and areas of atelectasis and ground glass opacities throughout the upper lobes. Early bronchopneumonia in RUL and superior segment of right lower lobe possible. CAD and aorta atherosclerosis . CXR 05/27/2020.  Cardiology Data  . EKG: Atrial tachycardia with 2:1 conduction.  Scheduled Meds: . (feeding supplement) PROSource Plus  30 mL Oral BID BM  . acetaminophen  650 mg Oral Q6H  . apixaban  5 mg Oral BID  . arformoterol  15 mcg Nebulization BID  . vitamin C  1,000 mg Oral QHS  . atorvastatin  40 mg Oral Daily  . budesonide (PULMICORT) nebulizer solution  2 mg Nebulization BID  .  calcium-vitamin D  1 tablet Oral Daily  . diltiazem  360 mg Oral Daily  . dofetilide  250 mcg Oral BID  . DULoxetine  60 mg Oral Daily  . feeding supplement (GLUCERNA SHAKE)  237 mL Oral TID BM  . fluticasone  1 spray Each Nare BID  . guaiFENesin  1,200 mg Oral BID  . insulin aspart  0-20 Units Subcutaneous TID WC  . insulin glargine  8 Units Subcutaneous Daily  . loratadine  10 mg Oral Daily  . metoprolol tartrate  50 mg Oral BID  . multivitamin with minerals  1 tablet Oral Daily  . pantoprazole  40 mg Oral Daily  . sodium chloride  2 spray Each Nare Q6H  . sodium chloride flush  3 mL Intravenous Q12H  . torsemide  40 mg Oral BID  . vitamin B-12  5,000 mcg Oral QHS  . Vitamin E  800 Units Oral QHS   And  . vitamin E  200 Units Oral QHS   Continuous Infusions: . sodium chloride      Principal Problem:   Acute respiratory failure (HCC) Active Problems:   Essential hypertension  CAD S/P percutaneous coronary angioplasty   Paroxysmal atrial fibrillation (HCC)   Pulmonary hypertension (HCC)   Severe obesity (BMI >= 40) (HCC)   Hyperlipidemia   Persistent atrial fibrillation (HCC)   Atypical atrial flutter (HCC)   Hypotension   Hypokalemia   Type 2 diabetes mellitus with hyperlipidemia (HCC)   LOS: 8 days   A & P  Acute hypoxemic respiratory failure due to decompensated diastolic heart failure with acute cardiogenic pulmonary edema.Pulmonary hypertension. Echocardiogram with preserved LV systolic function EF 55 to 60%, elevated PA pressure 63 mmHg systolic.The patient is currently saturating at 92% on HFNC 15L. Report on repeat CXR states that pulmonary vascular congestion is improved over previous. Wean as possible. Continue diuresis and strict I's and O's. Continue current diuretic regimen with torsemide 40 mg bid, to keep negative fluid balance.  Atrial fibrillation with rapid ventricular response: Thepatient has converted to 2:1 flutter, rate in the 70's. Continue  telemetry. Continue rate control with metoprolol 50 mg bid,diltiazem 360 mg and dofetilide. Continue antithrombotic therapy with apixaban.  RSV bronchitis/ viral pneumonia (present on admission): Oxygenation status slow to improve.Improving submandibular painful lymphadenopathy. Continue to have mild expiratory wheezing.  The patient is being managed on inhaled LABA andinhaledcorticosteroids. Encourage out of bed to chair tid with meals, pt and ot. She is receiving nasal flunisolide as well as guaifenesin and a flutter valve. Steroid started. Robitussin DM and Tussionex stopped and Tessalon perles started in their place as them may be contributing to the patient's delirium. Incentive spirometer at bedside to be used 6x an hour while awake.   T2DMuncontrolled with hyperglycemia/ dyslipidemia. Hypoglycemia in the last 24 hours. Will reduce lantus. FSBS is followed with SSI. Will ask that the patient receives a bedtime snack.  Delirium/Sundowning: Improved per husband. Patient spouse badly disturbed by a phone call from the patient this morning accusing him of abandoning her at a hotel without money or transportation. CT head ordered. It has demonstrated only atrophy and ethmoid sinus disease.  The patient was completely alert and oriented on my visit, although nursing states that she has come out with some non-sensical statements. Tussionex and Robitussin DM were discontinued as them may be contributing to her delirium. Will focus on mobilizing patient. I have also discussed with nursing and placed an order in support of her spouse spending the night to help keep her calm. Plan to resume PT/OT today.   Hyperlipidemia: Continue with atorvastatin.   AKI with Hypokalemia/hyperkalemia withmetabolic alkalosiscr continue to trend down, today at 1.39 with K at 4.8 and bicarbonate at 36. Continue current diuretic regimen with torsemide 40 mg bid, to keep negative fluid balance.   Depression:  continue withduloxetine.  Morbid Obesity Class III: Complicates all cares. BMI calculated at 42.  I have seen and examined this patient myself. I have spent 35 minutes in her evaluation and care. More than 50% of this was spent in counseling her spouse. All questions answered to the best of my ability.  DVT prophylaxis:      apixaban   Code Status:               full  Family Communication:       I have discussed the patient in detail with her husband. All questions answered to the best of my ability. Disposition:    Status is: Inpatient  Remains inpatient appropriate because:Inpatient level of care appropriate due to severity of illness  Dispo: The patient is from: Home  Anticipated d/c is  to: Home  Anticipated d/c date is: 2 days  Patient currently is not medically stable to d/c.  Demonica Farrey, DO [facility] Direct contact: see [email or web address]  7PM-7AM contact night coverage as above 05/27/2020, 3:28 PM  LOS: 7 days

## 2020-05-28 DIAGNOSIS — I484 Atypical atrial flutter: Secondary | ICD-10-CM | POA: Diagnosis not present

## 2020-05-28 DIAGNOSIS — I10 Essential (primary) hypertension: Secondary | ICD-10-CM | POA: Diagnosis not present

## 2020-05-28 DIAGNOSIS — J9601 Acute respiratory failure with hypoxia: Secondary | ICD-10-CM | POA: Diagnosis not present

## 2020-05-28 DIAGNOSIS — I2510 Atherosclerotic heart disease of native coronary artery without angina pectoris: Secondary | ICD-10-CM | POA: Diagnosis not present

## 2020-05-28 LAB — CBC WITH DIFFERENTIAL/PLATELET
Abs Immature Granulocytes: 0.06 10*3/uL (ref 0.00–0.07)
Basophils Absolute: 0 10*3/uL (ref 0.0–0.1)
Basophils Relative: 0 %
Eosinophils Absolute: 0.1 10*3/uL (ref 0.0–0.5)
Eosinophils Relative: 1 %
HCT: 45.2 % (ref 36.0–46.0)
Hemoglobin: 14.6 g/dL (ref 12.0–15.0)
Immature Granulocytes: 1 %
Lymphocytes Relative: 12 %
Lymphs Abs: 1.2 10*3/uL (ref 0.7–4.0)
MCH: 31 pg (ref 26.0–34.0)
MCHC: 32.3 g/dL (ref 30.0–36.0)
MCV: 96 fL (ref 80.0–100.0)
Monocytes Absolute: 0.1 10*3/uL (ref 0.1–1.0)
Monocytes Relative: 1 %
Neutro Abs: 8.3 10*3/uL — ABNORMAL HIGH (ref 1.7–7.7)
Neutrophils Relative %: 85 %
Platelets: 290 10*3/uL (ref 150–400)
RBC: 4.71 MIL/uL (ref 3.87–5.11)
RDW: 13.2 % (ref 11.5–15.5)
WBC: 9.6 10*3/uL (ref 4.0–10.5)
nRBC: 0 % (ref 0.0–0.2)

## 2020-05-28 LAB — BASIC METABOLIC PANEL
Anion gap: 11 (ref 5–15)
BUN: 24 mg/dL — ABNORMAL HIGH (ref 8–23)
CO2: 32 mmol/L (ref 22–32)
Calcium: 8.9 mg/dL (ref 8.9–10.3)
Chloride: 95 mmol/L — ABNORMAL LOW (ref 98–111)
Creatinine, Ser: 1.2 mg/dL — ABNORMAL HIGH (ref 0.44–1.00)
GFR, Estimated: 48 mL/min — ABNORMAL LOW (ref >60)
Glucose, Bld: 278 mg/dL — ABNORMAL HIGH (ref 70–99)
Potassium: 4.6 mmol/L (ref 3.5–5.1)
Sodium: 138 mmol/L (ref 135–145)

## 2020-05-28 LAB — GLUCOSE, CAPILLARY
Glucose-Capillary: 295 mg/dL — ABNORMAL HIGH (ref 70–99)
Glucose-Capillary: 208 mg/dL — ABNORMAL HIGH (ref 70–99)
Glucose-Capillary: 317 mg/dL — ABNORMAL HIGH (ref 70–99)
Glucose-Capillary: 286 mg/dL — ABNORMAL HIGH (ref 70–99)

## 2020-05-28 MED ORDER — INSULIN GLARGINE 100 UNIT/ML ~~LOC~~ SOLN
4.0000 [IU] | Freq: Once | SUBCUTANEOUS | Status: AC
  Administered 2020-05-28: 4 [IU] via SUBCUTANEOUS
  Filled 2020-05-28: qty 0.04

## 2020-05-28 MED ORDER — INSULIN GLARGINE 100 UNIT/ML ~~LOC~~ SOLN
12.0000 [IU] | Freq: Every day | SUBCUTANEOUS | Status: DC
  Administered 2020-05-29 – 2020-06-02 (×5): 12 [IU] via SUBCUTANEOUS
  Filled 2020-05-28 (×5): qty 0.12

## 2020-05-28 NOTE — Progress Notes (Addendum)
PROGRESS NOTE  [name] IPJ:[id number] DOB: October 14, 1945 DOA: 05/19/2020 PCP: [name], MD  Brief History   [name] was admitted to the hospital with the working diagnosis of acute hypoxic respiratory failure related to acute diastolic heart failure decompensation complicated with atrial fibrillation with RVR in the setting of acute bronchitis/ viral pneumonia due to RSV.   74 year old female past medical history for obesity class III, diastolic heart failure, coronary artery disease status post angioplasty, atrial fibrillation, pulmonary hypertension, who presented with dyspnea. Patient reported 5 days of dry cough, refractory to outpatient management with oral antibiotics.  She had persistent and worsening symptoms that prompted her to come to the hospital.  On her initial physical examination her oximetry was 82% on 6 L supplemental oxygen, blood pressure 122/58, heart rate 93, respiratory rate 30, oxygen saturation 90%, she had coarse breath sounds bilaterally, diffuse wheezing and rhonchi, heart S1-S2, present, tachycardic, abdomen soft, no lower extremity edema.   Sodium 136, potassium 3.1, chloride 88, bicarb 31, glucose one 68, BUN 27, creatinine 1.52, troponin I 20, white count 8.2, hemoglobin 14.9, hematocrit 45.8, platelets 215.  Respiratory panel positive for RSV.  SARS COVID-19 negative. Chest radiograph with congested hilar vasculature bilaterally.  No infiltrates. Chest CT with bilateral groundglass opacities. EKG 98 bpm, normal axis, first-degree AV block, sinus rhythm, Q waves in V1-V2, no significant ST segment or T wave changes.    Patient was placed on supplemental 02 per non re-breather and broad spectrum antibiotic therapy,   Developed atrial fibrillation with RVR and place on IV diltiazem.    Started on aggressive diuresis with IV furosemide.    Transitioned to oral AV blockade, added metoprolol for better rate control.    Old records personally reviewed: She  follows with Dr. Cheryln Manly as outpatient, has diagnosis ofd tachybradycardia syndrome sp pacemaker ([place] dual chamber), sp ablation x3 for her atrial fibrillation (last on 09/20).  Plan to repeat ablation.    Continue slow recovery, with improving symptoms and decreasing oxygen requirements.   Pt with confusion and agitation overnight 11/24-25 and in the early morning hours. Delirum causing medications have been stopped. CT head is pending. No obvious metabolic cause for this change. Likely delirium. I have placed an order in support of the patient's husband staying overnight. I have discussed the patient in detail with her husband. All questions answered to the best of my ability.  The patient's mental status has improved, although she continues to hallucinate occasionally. She did fall last night when she got out of bed without asking for assistance. Her oxygen requirements are down to 11L by HFNC from 15 by HFNC. Monitor. Continue diuresis.  Consultants  PCCM Cardiology  Procedures  None  Antibiotics   Anti-infectives (From admission, onward)    Start     Dose/Rate Route Frequency Ordered Stop   05/20/20 1500  cefTRIAXone (ROCEPHIN) 1 g in sodium chloride 0.9 % 100 mL IVPB  Status:  Discontinued        1 g 200 mL/hr over 30 Minutes Intravenous Every 24 hours 05/19/20 1831 05/20/20 1607   05/20/20 1000  doxycycline (VIBRAMYCIN) 100 mg in sodium chloride 0.9 % 250 mL IVPB  Status:  Discontinued        100 mg 125 mL/hr over 120 Minutes Intravenous Every 12 hours 05/19/20 1838 05/20/20 1607   05/20/20 0000  azithromycin (ZITHROMAX) 500 mg in sodium chloride 0.9 % 250 mL IVPB  Status:  Discontinued  500 mg 250 mL/hr over 60 Minutes Intravenous Every 24 hours 05/19/20 1831 05/19/20 1838   05/19/20 1515  cefTRIAXone (ROCEPHIN) 1 g in sodium chloride 0.9 % 100 mL IVPB        1 g 200 mL/hr over 30 Minutes Intravenous  Once 05/19/20 1506 05/19/20 1620   05/19/20 1515  azithromycin  (ZITHROMAX) 500 mg in sodium chloride 0.9 % 250 mL IVPB        500 mg 250 mL/hr over 60 Minutes Intravenous  Once 05/19/20 1506 05/19/20 1733      Subjective  The patient is resting comfortably. Her spouse is at bedside and feels that patient is expressing frustration over the patient's regression in terms of her oxygen requirements and the fact that she continues to hallucinate some, although not as much. Objective   Vitals:  Vitals:   05/28/20 0747 05/28/20 0908  BP:  134/78  Pulse: 98 (!) 113  Resp:  19  Temp:  98.1 F (36.7 C)  SpO2: 92% 90%    Exam:  Constitutional:  The patient is awake, alert, and oriented x 3. No acute distress. Respiratory:  No increased work of breathing. No wheezes or rhonchi Diminished breath sounds throughout. No tactile fremitus Cardiovascular:  Regular rate and rhythm No murmurs, ectopy, or gallups. No lateral PMI. No thrills. Abdomen:  Abdomen is soft, non-tender, non-distended No hernias, masses, or organomegaly Normoactive bowel sounds.  Musculoskeletal:  No cyanosis, clubbing, or edema Skin:  No rashes, lesions, ulcers palpation of skin: no induration or nodules Neurologic:  CN 2-12 intact Sensation all 4 extremities intact Psychiatric:  Mental status Mood, affect appropriate Orientation to person, place, time  judgment and insight appear intact  I have personally reviewed the following:   Today's Data  Vitals, BMP, CBC  Micro Data  Respiratory Viral Panel was positive for Parainfluenza Virus 4 and RSV.   Imaging  CT chest without contrast: Mediastinal and probable bilateral hilar adenopathy unchanged since 2019. Peribronchial thickeninc and areas of atelectasis and ground glass opacities throughout the upper lobes. Early bronchopneumonia in RUL and superior segment of right lower lobe possible. CAD and aorta atherosclerosis CXR 05/27/2020.  Cardiology Data  EKG: Atrial tachycardia with 2:1 conduction.  Scheduled  Meds:  (feeding supplement) PROSource Plus  30 mL Oral BID BM   acetaminophen  650 mg Oral Q6H   apixaban  5 mg Oral BID   arformoterol  15 mcg Nebulization BID   vitamin C  1,000 mg Oral QHS   atorvastatin  40 mg Oral Daily   budesonide (PULMICORT) nebulizer solution  2 mg Nebulization BID   calcium-vitamin D  1 tablet Oral Daily   diltiazem  360 mg Oral Daily   dofetilide  250 mcg Oral BID   DULoxetine  60 mg Oral Daily   feeding supplement (GLUCERNA SHAKE)  237 mL Oral TID BM   fluticasone  1 spray Each Nare BID   guaiFENesin  1,200 mg Oral BID   insulin aspart  0-20 Units Subcutaneous TID WC   insulin glargine  8 Units Subcutaneous Daily   loratadine  10 mg Oral Daily   methylPREDNISolone (SOLU-MEDROL) injection  60 mg Intravenous Daily   metoprolol tartrate  50 mg Oral BID   multivitamin with minerals  1 tablet Oral Daily   pantoprazole  40 mg Oral Daily   sodium chloride  2 spray Each Nare Q6H   sodium chloride flush  3 mL Intravenous Q12H   torsemide  40 mg  Oral BID   vitamin B-12  5,000 mcg Oral QHS   Vitamin E  800 Units Oral QHS   And   vitamin E  200 Units Oral QHS   Continuous Infusions:  sodium chloride      Principal Problem:   Acute respiratory failure (HCC) Active Problems:   Essential hypertension   CAD S/P percutaneous coronary angioplasty   Paroxysmal atrial fibrillation (HCC)   Pulmonary hypertension (HCC)   Severe obesity (BMI >= 40) (HCC)   Hyperlipidemia   Persistent atrial fibrillation (HCC)   Atypical atrial flutter (HCC)   Hypotension   Hypokalemia   Type 2 diabetes mellitus with hyperlipidemia (HCC)   LOS: 9 days   A & P  Acute hypoxemic respiratory failure due to decompensated diastolic heart failure with acute cardiogenic pulmonary edema. Pulmonary hypertension. Echocardiogram with preserved LV systolic function EF 55 to 60%, elevated PA pressure 63 mmHg systolic. The patient is currently saturating at 90% on HFNC 11L. Report on repeat  CXR states that pulmonary vascular congestion is improved over previous. Wean as possible. Continue diuresis and strict I's and O's. Continue current diuretic regimen with torsemide 40 mg bid, to keep negative fluid balance. Today the patient is 7 L negative in terms of fluid balance.    Atrial fibrillation with rapid ventricular response: The patient has converted to 2:1 flutter, rate in the 70's. Continue telemetry. Continue rate control with metoprolol 50 mg bid, diltiazem 360 mg and dofetilide.  Continue antithrombotic therapy with apixaban.    RSV bronchitis/ viral pneumonia (present on admission): Oxygenation status slow to improve.Improving submandibular painful lymphadenopathy. Continue to have mild expiratory wheezing.  The patient is being managed on inhaled LABA and inhaled corticosteroids. Encourage out of bed to chair tid with meals, pt and ot. She is receiving nasal flunisolide as well as guaifenesin and a flutter valve. Steroid started. Robitussin DM and Tussionex stopped and Tessalon perles started in their place as them may be contributing to the patient's delirium. Incentive spirometer at bedside to be used 6x an hour while awake. I have added steroids to help reduce airway inflammation.    T2DM uncontrolled with hyperglycemia/ dyslipidemia. Hypoglycemia in the last 24 hours. Will reduce lantus.  FSBS is followed with SSI. Will ask that the patient receives a bedtime snack.  Delirium/Sundowning: Improved per husband. Patient spouse badly disturbed by a phone call from the patient this morning accusing him of abandoning her at a hotel without money or transportation. CT head ordered. It has demonstrated only atrophy and ethmoid sinus disease.  The patient was completely alert and oriented on my visit, although nursing states that she has come out with some non-sensical statements. Tussionex and Robitussin DM were discontinued as them may be contributing to her delirium. Will focus on  mobilizing patient. I have also discussed with nursing and placed an order in support of her spouse spending the night to help keep her calm. Plan to resume PT/OT today. She is improved, but continues to have hallucinations.   Hyperlipidemia: Continue with atorvastatin.    AKI with Hypokalemia/ hyperkalemia on CKD II: Baseline creatinine 1.10 as of 02/2020,  today at 1.20 with K at 4.6 and bicarbonate at 36. Continue current diuretic regimen with torsemide 40 mg bid, to keep negative fluid balance.    Depression: continue with duloxetine.  Morbid Obesity Class III: Complicates all cares. BMI calculated at 42.   I have seen and examined this patient myself. I have spent 32 minutes in  her evaluation and care. More than 50% of this was spent in counseling her spouse. All questions answered to the best of my ability.   DVT prophylaxis:      apixaban   Code Status:               full  Family Communication:       I have discussed the patient in detail with her husband. All questions answered to the best of my ability. Disposition:    Status is: Inpatient   Remains inpatient appropriate because:Inpatient level of care appropriate due to severity of illness   Dispo: The patient is from: Home              Anticipated d/c is to: Home              Anticipated d/c date is: 2 days              Patient currently is not medically stable to d/c.  Kortni Hasten, DO [facility] Direct contact: see [email or web address]  7PM-7AM contact night coverage as above 05/28/2020, 4:25 PM  LOS: 7 days

## 2020-05-28 NOTE — Progress Notes (Signed)
Occupational Therapy Treatment Patient Details Name: CHELAN HERINGER MRN: [id number] DOB: 1945/12/02 Today's Date: 05/28/2020    History of present illness 74 y.o. female with medical history significant for morbid obesity, congestive heart failure, coronary artery disease status post stent angioplasty, history of A. fib status post ablation on chronic anticoagulation therapy with Eliquis, pulmonary hypertension status post pacemaker insertion who presents to the ER on 11/17 for worsening shortness of breath. Workup for acute on chronic diastolic heart failure, RSV.   OT comments  This 74 yo female admitted with above making good progress today. She ambulated 150 feet on 5 liters HFNC with sats not dropping below 95%, she was able to toilet with min guard A at RW level, and stand at sink with S. Pt will continue to benefit from acute OT without need for follow up.  Follow Up Recommendations  No OT follow up;Supervision/Assistance - 24 hour    Equipment Recommendations  None recommended by OT       Precautions / Restrictions Precautions Precautions: Fall Precaution Comments: monitor O2 Restrictions Weight Bearing Restrictions: No       Mobility Bed Mobility               General bed mobility comments: Pt up in recliner upon my arrival  Transfers Overall transfer level: Needs assistance Equipment used: Rolling walker (2 wheeled) Transfers: Sit to/from Stand Sit to Stand: Supervision         General transfer comment: Pt ambulated 150 feet with RW on 5 liters with HFNC and RW with sats not dropping below 95% and pt talking most of the walk    Balance Overall balance assessment: No apparent balance deficits (not formally assessed)                                         ADL either performed or assessed with clinical judgement   ADL Overall ADL's : Needs assistance/impaired     Grooming: Supervision/safety;Standing;Wash/dry hands                    Toilet Transfer: Min guard;Ambulation;Comfort height toilet;Grab bars   Toileting- Clothing Manipulation and Hygiene: Min guard;Sit to/from stand         General ADL Comments: Educated pt on purse lipped breathing     Vision Baseline Vision/History: Wears glasses Wears Glasses: At all times Patient Visual Report: No change from baseline            Cognition Arousal/Alertness: Awake/alert Behavior During Therapy: WFL for tasks assessed/performed Overall Cognitive Status: Within Functional Limits for tasks assessed                                                     Pertinent Vitals/ Pain       Pain Assessment: No/denies pain         Frequency  Min 2X/week        Progress Toward Goals  OT Goals(current goals can now be found in the care plan section)  Progress towards OT goals: Progressing toward goals     Plan Discharge plan remains appropriate       AM-PAC OT "6 Clicks" Daily Activity     Outcome Measure   Help from  another person eating meals?: None Help from another person taking care of personal grooming?: A Little Help from another person toileting, which includes using toliet, bedpan, or urinal?: A Little Help from another person bathing (including washing, rinsing, drying)?: A Little Help from another person to put on and taking off regular upper body clothing?: A Little Help from another person to put on and taking off regular lower body clothing?: A Little 6 Click Score: 19    End of Session Equipment Utilized During Treatment: Oxygen (10 L HFNC on wall, able to ambulate on 5 liters HFNC with sats not dropping lower than 95%)  OT Visit Diagnosis: Muscle weakness (generalized) (M62.81)   Activity Tolerance Patient tolerated treatment well   Patient Left in chair;with call bell/phone within reach;with chair alarm set   Nurse Communication  (Let RN know that pt was 96-100% on 10 liters HFNC, he OK'd to turn pt down to 6  liters HFNC--chat texted him that pt was 94% on the 6 liters at rest)        Time: 1430-1507 OT Time Calculation (min): 37 min  Charges: OT General Charges $OT Visit: 1 Visit OT Treatments $Self Care/Home Management : 23-37 mins  [place], OTR/L Acute [employer] Pager [phone] Office [phone]      Almon Register 05/28/2020, 4:07 PM

## 2020-05-28 NOTE — Plan of Care (Signed)

## 2020-05-29 DIAGNOSIS — I10 Essential (primary) hypertension: Secondary | ICD-10-CM | POA: Diagnosis not present

## 2020-05-29 DIAGNOSIS — I484 Atypical atrial flutter: Secondary | ICD-10-CM | POA: Diagnosis not present

## 2020-05-29 DIAGNOSIS — J9601 Acute respiratory failure with hypoxia: Secondary | ICD-10-CM | POA: Diagnosis not present

## 2020-05-29 DIAGNOSIS — I2510 Atherosclerotic heart disease of native coronary artery without angina pectoris: Secondary | ICD-10-CM | POA: Diagnosis not present

## 2020-05-29 LAB — GLUCOSE, CAPILLARY
Glucose-Capillary: 158 mg/dL — ABNORMAL HIGH (ref 70–99)
Glucose-Capillary: 172 mg/dL — ABNORMAL HIGH (ref 70–99)
Glucose-Capillary: 327 mg/dL — ABNORMAL HIGH (ref 70–99)
Glucose-Capillary: 347 mg/dL — ABNORMAL HIGH (ref 70–99)

## 2020-05-29 NOTE — Progress Notes (Signed)
Physical Therapy Treatment Patient Details Name: [name] MRN: [id number] DOB: 07-26-1945 Today's Date: 05/29/2020    History of Present Illness 74 y.o. female with medical history significant for morbid obesity, congestive heart failure, coronary artery disease status post stent angioplasty, history of A. fib status post ablation on chronic anticoagulation therapy with Eliquis, pulmonary hypertension status post pacemaker insertion who presents to the ER on 11/17 for worsening shortness of breath. Workup for acute on chronic diastolic heart failure, RSV.    PT Comments    Pt progressing well toward her goals.  Emphasis on standing exercise, gait stability/progression on 6 the 4L [place] with sats 91-96% at max of 106 bpm   Follow Up Recommendations  No PT follow up;Supervision for mobility/OOB     Equipment Recommendations  None recommended by PT    Recommendations for Other Services       Precautions / Restrictions Precautions Precautions: Fall Precaution Comments: monitor O2    Mobility  Bed Mobility               General bed mobility comments: Pt up in recliner upon my arrival  Transfers Overall transfer level: Needs assistance Equipment used: Rolling walker (2 wheeled) Transfers: Sit to/from Stand Sit to Stand: Supervision            Ambulation/Gait Ambulation/Gait assistance: Supervision Gait Distance (Feet): 400 Feet Assistive device: Rolling walker (2 wheeled) Gait Pattern/deviations: Step-through pattern Gait velocity: moderate Gait velocity interpretation: 1.31 - 2.62 ft/sec, indicative of limited community ambulator General Gait Details: Light use of the RW.  Steady with ability to divide attension in conversation and scanning without deviation.  Pt able to significantly change speeds.     Stairs             Wheelchair Mobility    Modified Rankin (Stroke Patients Only)       Balance Overall balance assessment: No apparent balance  deficits (not formally assessed)                                          Cognition Arousal/Alertness: Awake/alert Behavior During Therapy: WFL for tasks assessed/performed Overall Cognitive Status: Within Functional Limits for tasks assessed                                        Exercises General Exercises - Lower Extremity Hip ABduction/ADduction: AROM;Strengthening;5 reps;Standing;Both Hip Flexion/Marching: AROM;10 reps;Both;Standing Toe Raises: AROM;Both;10 reps;Standing Heel Raises: AROM;Both;10 reps;Standing Mini-Sqauts: AROM;Both;10 reps;Standing    General Comments General comments (skin integrity, edema, etc.): Sats on 6L   92%/76 bpm initially, 96%/ 105 bpm during gait trials, then drop in O2 to 4L with sats at 91% at 106 bpm.      Pertinent Vitals/Pain Pain Assessment: No/denies pain    Home Living                      Prior Function            PT Goals (current goals can now be found in the care plan section) Acute Rehab PT Goals Patient Stated Goal: to breathe better so I can walk in the parks PT Goal Formulation: With patient Time For Goal Achievement: 06/03/20 Potential to Achieve Goals: Good Progress towards PT goals: Progressing toward goals  Frequency    Min 3X/week      PT Plan Current plan remains appropriate    Co-evaluation              AM-PAC PT "6 Clicks" Mobility   Outcome Measure  Help needed turning from your back to your side while in a flat bed without using bedrails?: None Help needed moving from lying on your back to sitting on the side of a flat bed without using bedrails?: None Help needed moving to and from a bed to a chair (including a wheelchair)?: None Help needed standing up from a chair using your arms (e.g., wheelchair or bedside chair)?: None Help needed to walk in hospital room?: None Help needed climbing 3-5 steps with a railing? : None 6 Click Score: 24     End of Session Equipment Utilized During Treatment: Oxygen Activity Tolerance: Patient tolerated treatment well Patient left: with call bell/phone within reach;in chair Nurse Communication: Mobility status PT Visit Diagnosis: Other abnormalities of gait and mobility (R26.89)     Time: 0721-8288 PT Time Calculation (min) (ACUTE ONLY): 28 min  Charges:  $Gait Training: 8-22 mins $Therapeutic Exercise: 8-22 mins                     05/29/2020  Ginger Carne., PT Acute Rehabilitation Services [phone]  (pager) [phone]  (office)   Tessie Fass Ahaan Zobrist 05/29/2020, 10:37 AM

## 2020-05-29 NOTE — Progress Notes (Signed)
PROGRESS NOTE  ALONIA DIBUONO ZOX:[id number] DOB: 11-May-1946 DOA: 05/19/2020 PCP: [name], MD  Brief History   Mrs. Brannick was admitted to the hospital with the working diagnosis of acute hypoxic respiratory failure related to acute diastolic heart failure decompensation complicated with atrial fibrillation with RVR in the setting of acute bronchitis/ viral pneumonia due to RSV.   74 year old female past medical history for obesity class III, diastolic heart failure, coronary artery disease status post angioplasty, atrial fibrillation, pulmonary hypertension, who presented with dyspnea. Patient reported 5 days of dry cough, refractory to outpatient management with oral antibiotics.  She had persistent and worsening symptoms that prompted her to come to the hospital.  On her initial physical examination her oximetry was 82% on 6 L supplemental oxygen, blood pressure 122/58, heart rate 93, respiratory rate 30, oxygen saturation 90%, she had coarse breath sounds bilaterally, diffuse wheezing and rhonchi, heart S1-S2, present, tachycardic, abdomen soft, no lower extremity edema.   Sodium 136, potassium 3.1, chloride 88, bicarb 31, glucose one 68, BUN 27, creatinine 1.52, troponin I 20, white count 8.2, hemoglobin 14.9, hematocrit 45.8, platelets 215.  Respiratory panel positive for RSV.  SARS COVID-19 negative. Chest radiograph with congested hilar vasculature bilaterally.  No infiltrates. Chest CT with bilateral groundglass opacities. EKG 98 bpm, normal axis, first-degree AV block, sinus rhythm, Q waves in V1-V2, no significant ST segment or T wave changes.    Patient was placed on supplemental 02 per non re-breather and broad spectrum antibiotic therapy,   Developed atrial fibrillation with RVR and place on IV diltiazem.    Started on aggressive diuresis with IV furosemide.    Transitioned to oral AV blockade, added metoprolol for better rate control.    Old records personally reviewed: She  follows with Dr. Cheryln Manly as outpatient, has diagnosis ofd tachybradycardia syndrome sp pacemaker ([employer] dual chamber), sp ablation x3 for her atrial fibrillation (last on 09/20).  Plan to repeat ablation.    Continue slow recovery, with improving symptoms and decreasing oxygen requirements.   Pt with confusion and agitation overnight 11/24-25 and in the early morning hours. Delirum causing medications have been stopped. CT head is pending. No obvious metabolic cause for this change. Likely delirium. I have placed an order in support of the patient's husband staying overnight. I have discussed the patient in detail with her husband. All questions answered to the best of my ability.  The patient's mental status has improved, although she continues to hallucinate occasionally. She did fall last night when she got out of bed without asking for assistance. Her oxygen requirements are down to 6L by HFNC from 15 by HFNC on 05/27/2020. Monitor. Continue diuresis.  Consultants  . PCCM . Cardiology  Procedures  . None  Antibiotics   Anti-infectives (From admission, onward)   Start     Dose/Rate Route Frequency Ordered Stop   05/20/20 1500  cefTRIAXone (ROCEPHIN) 1 g in sodium chloride 0.9 % 100 mL IVPB  Status:  Discontinued        1 g 200 mL/hr over 30 Minutes Intravenous Every 24 hours 05/19/20 1831 05/20/20 1607   05/20/20 1000  doxycycline (VIBRAMYCIN) 100 mg in sodium chloride 0.9 % 250 mL IVPB  Status:  Discontinued        100 mg 125 mL/hr over 120 Minutes Intravenous Every 12 hours 05/19/20 1838 05/20/20 1607   05/20/20 0000  azithromycin (ZITHROMAX) 500 mg in sodium chloride 0.9 % 250 mL IVPB  Status:  Discontinued  500 mg 250 mL/hr over 60 Minutes Intravenous Every 24 hours 05/19/20 1831 05/19/20 1838   05/19/20 1515  cefTRIAXone (ROCEPHIN) 1 g in sodium chloride 0.9 % 100 mL IVPB        1 g 200 mL/hr over 30 Minutes Intravenous  Once 05/19/20 1506 05/19/20 1620   05/19/20  1515  azithromycin (ZITHROMAX) 500 mg in sodium chloride 0.9 % 250 mL IVPB        500 mg 250 mL/hr over 60 Minutes Intravenous  Once 05/19/20 1506 05/19/20 1733     Subjective  The patient is sitting up at bedside. No new complaints. She states that she has had no further hallucinations. Objective   Vitals:  Vitals:   05/29/20 0749 05/29/20 1303  BP:  (!) 116/54  Pulse:  82  Resp:  19  Temp:  98.3 F (36.8 C)  SpO2: 98% 93%    Exam:  Constitutional:  . The patient is awake, alert, and oriented x 3. No acute distress. Respiratory:  . No increased work of breathing. . No wheezes or rhonchi . Diminished breath sounds throughout. . No tactile fremitus Cardiovascular:  . Regular rate and rhythm . No murmurs, ectopy, or gallups. . No lateral PMI. No thrills. Abdomen:  . Abdomen is soft, non-tender, non-distended . No hernias, masses, or organomegaly . Normoactive bowel sounds.  Musculoskeletal:  . No cyanosis, clubbing, or edema Skin:  . No rashes, lesions, ulcers . palpation of skin: no induration or nodules Neurologic:  . CN 2-12 intact . Sensation all 4 extremities intact Psychiatric:  . Mental status o Mood, affect appropriate o Orientation to person, place, time  . judgment and insight appear intact  I have personally reviewed the following:   Today's Data  . Vitals, BMP, CBC, glucoses  Micro Data  . Respiratory Viral Panel was positive for Parainfluenza Virus 4 and RSV.   Imaging  . CT chest without contrast: Mediastinal and probable bilateral hilar adenopathy unchanged since 2019. [name] Peribronchial thickeninc and areas of atelectasis and ground glass opacities throughout the upper lobes. Early bronchopneumonia in RUL and superior segment of right lower lobe possible. CAD and aorta atherosclerosis . CXR 05/27/2020.  Cardiology Data  . EKG: Atrial tachycardia with 2:1 conduction.  Scheduled Meds: . (feeding supplement) PROSource Plus  30 mL Oral BID BM   . acetaminophen  650 mg Oral Q6H  . apixaban  5 mg Oral BID  . arformoterol  15 mcg Nebulization BID  . vitamin C  1,000 mg Oral QHS  . atorvastatin  40 mg Oral Daily  . budesonide (PULMICORT) nebulizer solution  2 mg Nebulization BID  . calcium-vitamin D  1 tablet Oral Daily  . diltiazem  360 mg Oral Daily  . dofetilide  250 mcg Oral BID  . DULoxetine  60 mg Oral Daily  . feeding supplement (GLUCERNA SHAKE)  237 mL Oral TID BM  . fluticasone  1 spray Each Nare BID  . guaiFENesin  1,200 mg Oral BID  . insulin aspart  0-20 Units Subcutaneous TID WC  . insulin glargine  12 Units Subcutaneous Daily  . loratadine  10 mg Oral Daily  . methylPREDNISolone (SOLU-MEDROL) injection  60 mg Intravenous Daily  . metoprolol tartrate  50 mg Oral BID  . multivitamin with minerals  1 tablet Oral Daily  . pantoprazole  40 mg Oral Daily  . sodium chloride  2 spray Each Nare Q6H  . sodium chloride flush  3 mL Intravenous Q12H  .  torsemide  40 mg Oral BID  . vitamin B-12  5,000 mcg Oral QHS  . Vitamin E  800 Units Oral QHS   And  . vitamin E  200 Units Oral QHS   Continuous Infusions: . sodium chloride      Principal Problem:   Acute respiratory failure (HCC) Active Problems:   Essential hypertension   CAD S/P percutaneous coronary angioplasty   Paroxysmal atrial fibrillation (HCC)   Pulmonary hypertension (HCC)   Severe obesity (BMI >= 40) (HCC)   Hyperlipidemia   Persistent atrial fibrillation (HCC)   Atypical atrial flutter (HCC)   Hypotension   Hypokalemia   Type 2 diabetes mellitus with hyperlipidemia (HCC)   LOS: 10 days   A & P  Acute hypoxemic respiratory failure due to decompensated diastolic heart failure with acute cardiogenic pulmonary edema. Pulmonary hypertension. Echocardiogram with preserved LV systolic function EF 55 to 60%, elevated PA pressure 63 mmHg systolic. The patient is currently saturating at 90% on HFNC 11L. Report on repeat CXR states that pulmonary vascular  congestion is improved over previous. Wean as possible. Continue diuresis and strict I's and O's. Continue current diuretic regimen with torsemide 40 mg bid, to keep negative fluid balance. Today the patient is 7.5 L negative in terms of fluid balance.    Atrial fibrillation with rapid ventricular response: The patient has converted to 2:1 flutter, rate in the 70's. Continue telemetry. Continue rate control with metoprolol 50 mg bid, diltiazem 360 mg and dofetilide.  Continue antithrombotic therapy with apixaban.    RSV bronchitis/ viral pneumonia (present on admission): Oxygenation status slow to improve.Improving submandibular painful lymphadenopathy. Continue to have mild expiratory wheezing.  The patient is being managed on inhaled LABA and inhaled corticosteroids. Encourage out of bed to chair tid with meals, pt and ot. She is receiving nasal flunisolide as well as guaifenesin and a flutter valve. Steroid started. Robitussin DM and Tussionex stopped and Tessalon perles started in their place as them may be contributing to the patient's delirium. Incentive spirometer at bedside to be used 6x an hour while awake. I have added steroids to help reduce airway inflammation. Will begin to wean steroids down.   T2DM uncontrolled with hyperglycemia/ dyslipidemia. Hypoglycemia in the last 24 hours. Glucoses are high due to the effect of steroids.  FSBS is followed with [facility].   Delirium/Sundowning:resolved. Patient denies any further hallucinations. Patient spouse badly disturbed by a phone call from the patient this morning accusing him of abandoning her at a hotel without money or transportation. CT head ordered. It has demonstrated only atrophy and ethmoid sinus disease.  The patient was completely alert and oriented on my visit, although nursing states that she has come out with some non-sensical statements. Tussionex and Robitussin DM were discontinued as them may be contributing to her delirium. Will focus on  mobilizing patient. I have also discussed with nursing and placed an order in support of her spouse spending the night to help keep her calm. Plan to resume PT/OT today. She is improved, but continues to have hallucinations.   Hyperlipidemia: Continue with atorvastatin.    AKI with Hypokalemia/ hyperkalemia on CKD II: Baseline creatinine 1.10 as of 02/2020,  05/28/2020 at 1.20 with K at 4.6 and bicarbonate at 36. Continue current diuretic regimen with torsemide 40 mg bid, to keep negative fluid balance.    Depression: continue with duloxetine.  Morbid Obesity Class III: Complicates all cares. BMI calculated at 42.   I have seen and examined  this patient myself. I have spent 32 minutes in her evaluation and care.    DVT prophylaxis:      apixaban   Code Status:               full  Family Communication:       None available today. Disposition:    Status is: Inpatient   Remains inpatient appropriate because:Inpatient level of care appropriate due to severity of illness   Dispo: The patient is from: Home              Anticipated d/c is to: Home              Anticipated d/c date is: 2 days              Patient currently is not medically stable to d/c.  Kobe Jansma, DO [facility] Direct contact: see [email or web address]  7PM-7AM contact night coverage as above 05/29/2020, 4:00 PM  LOS: 7 days

## 2020-05-29 NOTE — Plan of Care (Signed)

## 2020-05-30 DIAGNOSIS — I10 Essential (primary) hypertension: Secondary | ICD-10-CM | POA: Diagnosis not present

## 2020-05-30 DIAGNOSIS — I2510 Atherosclerotic heart disease of native coronary artery without angina pectoris: Secondary | ICD-10-CM | POA: Diagnosis not present

## 2020-05-30 DIAGNOSIS — J9601 Acute respiratory failure with hypoxia: Secondary | ICD-10-CM | POA: Diagnosis not present

## 2020-05-30 DIAGNOSIS — I484 Atypical atrial flutter: Secondary | ICD-10-CM | POA: Diagnosis not present

## 2020-05-30 LAB — GLUCOSE, CAPILLARY
Glucose-Capillary: 144 mg/dL — ABNORMAL HIGH (ref 70–99)
Glucose-Capillary: 207 mg/dL — ABNORMAL HIGH (ref 70–99)
Glucose-Capillary: 264 mg/dL — ABNORMAL HIGH (ref 70–99)
Glucose-Capillary: 303 mg/dL — ABNORMAL HIGH (ref 70–99)
Glucose-Capillary: 284 mg/dL — ABNORMAL HIGH (ref 70–99)

## 2020-05-30 LAB — MAGNESIUM: Magnesium: 2.2 mg/dL (ref 1.7–2.4)

## 2020-05-30 LAB — BASIC METABOLIC PANEL
Anion gap: 15 (ref 5–15)
BUN: 29 mg/dL — ABNORMAL HIGH (ref 8–23)
CO2: 31 mmol/L (ref 22–32)
Calcium: 8.4 mg/dL — ABNORMAL LOW (ref 8.9–10.3)
Chloride: 95 mmol/L — ABNORMAL LOW (ref 98–111)
Creatinine, Ser: 1.23 mg/dL — ABNORMAL HIGH (ref 0.44–1.00)
GFR, Estimated: 46 mL/min — ABNORMAL LOW (ref >60)
Glucose, Bld: 210 mg/dL — ABNORMAL HIGH (ref 70–99)
Potassium: 3.2 mmol/L — ABNORMAL LOW (ref 3.5–5.1)
Sodium: 141 mmol/L (ref 135–145)

## 2020-05-30 MED ORDER — POTASSIUM CHLORIDE CRYS ER 20 MEQ PO TBCR
40.0000 meq | EXTENDED_RELEASE_TABLET | ORAL | Status: AC
  Administered 2020-05-30 (×2): 40 meq via ORAL
  Filled 2020-05-30 (×2): qty 2

## 2020-05-30 NOTE — Progress Notes (Signed)
PROGRESS NOTE  [name] ZLD:[id number] DOB: 1945/11/04 DOA: 05/19/2020 PCP: [name], MD  Brief History   [name] was admitted to the hospital with the working diagnosis of acute hypoxic respiratory failure related to acute diastolic heart failure decompensation complicated with atrial fibrillation with RVR in the setting of acute bronchitis/ viral pneumonia due to RSV.   74 year old female past medical history for obesity class III, diastolic heart failure, coronary artery disease status post angioplasty, atrial fibrillation, pulmonary hypertension, who presented with dyspnea. Patient reported 5 days of dry cough, refractory to outpatient management with oral antibiotics.  She had persistent and worsening symptoms that prompted her to come to the hospital.  On her initial physical examination her oximetry was 82% on 6 L supplemental oxygen, blood pressure 122/58, heart rate 93, respiratory rate 30, oxygen saturation 90%, she had coarse breath sounds bilaterally, diffuse wheezing and rhonchi, heart S1-S2, present, tachycardic, abdomen soft, no lower extremity edema.   Sodium 136, potassium 3.1, chloride 88, bicarb 31, glucose one 68, BUN 27, creatinine 1.52, troponin I 20, white count 8.2, hemoglobin 14.9, hematocrit 45.8, platelets 215.  Respiratory panel positive for RSV.  SARS COVID-19 negative. Chest radiograph with congested hilar vasculature bilaterally.  No infiltrates. Chest CT with bilateral groundglass opacities. EKG 98 bpm, normal axis, first-degree AV block, sinus rhythm, Q waves in V1-V2, no significant ST segment or T wave changes.    Patient was placed on supplemental 02 per non re-breather and broad spectrum antibiotic therapy,   Developed atrial fibrillation with RVR and place on IV diltiazem.    Started on aggressive diuresis with IV furosemide.    Transitioned to oral AV blockade, added metoprolol for better rate control.    Old records personally reviewed: She  follows with [name] as outpatient, has diagnosis ofd tachybradycardia syndrome sp pacemaker ([employer] dual chamber), sp ablation x3 for her atrial fibrillation (last on 09/20).  Plan to repeat ablation.    Continue slow recovery, with improving symptoms and decreasing oxygen requirements.   Pt with confusion and agitation overnight 11/24-25 and in the early morning hours. Delirum causing medications have been stopped. CT head is pending. No obvious metabolic cause for this change. Likely delirium. I have placed an order in support of the patient's husband staying overnight. I have discussed the patient in detail with her husband. All questions answered to the best of my ability.  The patient's mental status has improved, although she continues to hallucinate occasionally. She did fall last night when she got out of bed without asking for assistance. Her oxygen requirements are down to 4L by HFNC from 15 by HFNC on 05/27/2020. Monitor. Continue diuresis.  On 05/29/2020 the patient's heart rate was elevated, but responded to Diltiazem.   Consultants  . PCCM . Cardiology  Procedures  . None  Antibiotics   Anti-infectives (From admission, onward)   Start     Dose/Rate Route Frequency Ordered Stop   05/20/20 1500  cefTRIAXone (ROCEPHIN) 1 g in sodium chloride 0.9 % 100 mL IVPB  Status:  Discontinued        1 g 200 mL/hr over 30 Minutes Intravenous Every 24 hours 05/19/20 1831 05/20/20 1607   05/20/20 1000  doxycycline (VIBRAMYCIN) 100 mg in sodium chloride 0.9 % 250 mL IVPB  Status:  Discontinued        100 mg 125 mL/hr over 120 Minutes Intravenous Every 12 hours 05/19/20 1838 05/20/20 1607   05/20/20 0000  azithromycin (ZITHROMAX)  500 mg in sodium chloride 0.9 % 250 mL IVPB  Status:  Discontinued        500 mg 250 mL/hr over 60 Minutes Intravenous Every 24 hours 05/19/20 1831 05/19/20 1838   05/19/20 1515  cefTRIAXone (ROCEPHIN) 1 g in sodium chloride 0.9 % 100 mL IVPB        1  g 200 mL/hr over 30 Minutes Intravenous  Once 05/19/20 1506 05/19/20 1620   05/19/20 1515  azithromycin (ZITHROMAX) 500 mg in sodium chloride 0.9 % 250 mL IVPB        500 mg 250 mL/hr over 60 Minutes Intravenous  Once 05/19/20 1506 05/19/20 1733     Subjective  The patient is sitting up at bedside. No new complaints. No new complaints. Objective   Vitals:  Vitals:   05/30/20 1000 05/30/20 1100  BP:    Pulse: 97 84  Resp:    Temp:    SpO2:      Exam:  Constitutional:  . The patient is awake, alert, and oriented x 3. No acute distress. Respiratory:  . No increased work of breathing. . No wheezes or rhonchi . Diminished breath sounds throughout. . No tactile fremitus Cardiovascular:  . Regular rate and rhythm . No murmurs, ectopy, or gallups. . No lateral PMI. No thrills. Abdomen:  . Abdomen is soft, non-tender, non-distended . No hernias, masses, or organomegaly . Normoactive bowel sounds.  Musculoskeletal:  . No cyanosis, clubbing, or edema Skin:  . No rashes, lesions, ulcers . palpation of skin: no induration or nodules Neurologic:  . CN 2-12 intact . Sensation all 4 extremities intact Psychiatric:  . Mental status o Mood, affect appropriate o Orientation to person, place, time  . judgment and insight appear intact  I have personally reviewed the following:   Today's Data  . Vitals, BMP, CBC, glucoses  Micro Data  . Respiratory Viral Panel was positive for Parainfluenza Virus 4 and RSV.   Imaging  . CT chest without contrast: Mediastinal and probable bilateral hilar adenopathy unchanged since 2019. [name] Peribronchial thickeninc and areas of atelectasis and ground glass opacities throughout the upper lobes. Early bronchopneumonia in RUL and superior segment of right lower lobe possible. CAD and aorta atherosclerosis . CXR 05/27/2020.  Cardiology Data  . EKG: Atrial tachycardia with 2:1 conduction.  Scheduled Meds: . (feeding supplement) PROSource Plus   30 mL Oral BID BM  . acetaminophen  650 mg Oral Q6H  . apixaban  5 mg Oral BID  . arformoterol  15 mcg Nebulization BID  . vitamin C  1,000 mg Oral QHS  . atorvastatin  40 mg Oral Daily  . budesonide (PULMICORT) nebulizer solution  2 mg Nebulization BID  . calcium-vitamin D  1 tablet Oral Daily  . diltiazem  360 mg Oral Daily  . dofetilide  250 mcg Oral BID  . DULoxetine  60 mg Oral Daily  . feeding supplement (GLUCERNA SHAKE)  237 mL Oral TID BM  . fluticasone  1 spray Each Nare BID  . guaiFENesin  1,200 mg Oral BID  . insulin aspart  0-20 Units Subcutaneous TID WC  . insulin glargine  12 Units Subcutaneous Daily  . loratadine  10 mg Oral Daily  . methylPREDNISolone (SOLU-MEDROL) injection  60 mg Intravenous Daily  . metoprolol tartrate  50 mg Oral BID  . multivitamin with minerals  1 tablet Oral Daily  . pantoprazole  40 mg Oral Daily  . potassium chloride  40 mEq Oral Q4H  .  sodium chloride  2 spray Each Nare Q6H  . sodium chloride flush  3 mL Intravenous Q12H  . torsemide  40 mg Oral BID  . vitamin B-12  5,000 mcg Oral QHS  . Vitamin E  800 Units Oral QHS   And  . vitamin E  200 Units Oral QHS   Continuous Infusions: . sodium chloride      Principal Problem:   Acute respiratory failure (HCC) Active Problems:   Essential hypertension   CAD S/P percutaneous coronary angioplasty   Paroxysmal atrial fibrillation (HCC)   Pulmonary hypertension (HCC)   Severe obesity (BMI >= 40) (HCC)   Hyperlipidemia   Persistent atrial fibrillation (HCC)   Atypical atrial flutter (HCC)   Hypotension   Hypokalemia   Type 2 diabetes mellitus with hyperlipidemia (HCC)   LOS: 11 days   A & P  Acute hypoxemic respiratory failure due to decompensated diastolic heart failure with acute cardiogenic pulmonary edema. Pulmonary hypertension. Echocardiogram with preserved LV systolic function EF 55 to 60%, elevated PA pressure 63 mmHg systolic. The patient is currently saturating at 94% on  HFNC 4L. Report on repeat CXR states that pulmonary vascular congestion is improved over previous. Wean as possible. Continue diuresis and strict I's and O's. Continue current diuretic regimen with torsemide 40 mg bid, to keep negative fluid balance. Today the patient is 7.5 L negative in terms of fluid balance.    Atrial fibrillation with rapid ventricular response: The patient has converted to 2:1 flutter, rate in the 70's. Continue telemetry. Continue rate control with metoprolol 50 mg bid, diltiazem 360 mg and dofetilide.  Continue antithrombotic therapy with apixaban.    RSV bronchitis/ viral pneumonia (present on admission): Oxygenation status slow to improve.Improving submandibular painful lymphadenopathy. Continue to have mild expiratory wheezing.  The patient is being managed on inhaled LABA and inhaled corticosteroids. Encourage out of bed to chair tid with meals, pt and ot. She is receiving nasal flunisolide as well as guaifenesin and a flutter valve. Steroid started. Robitussin DM and Tussionex stopped and Tessalon perles started in their place as them may be contributing to the patient's delirium. Incentive spirometer at bedside to be used 6x an hour while awake. I have added steroids to help reduce airway inflammation. Will begin to wean steroids down.   T2DM uncontrolled with hyperglycemia/ dyslipidemia. Hypoglycemia in the last 24 hours. Glucoses are high due to the effect of steroids.  FSBS is followed with [facility].   Delirium/Sundowning:resolved. Patient denies any further hallucinations. Patient spouse badly disturbed by a phone call from the patient this morning accusing him of abandoning her at a hotel without money or transportation. CT head ordered. It has demonstrated only atrophy and ethmoid sinus disease.  The patient was completely alert and oriented on my visit, although nursing states that she has come out with some non-sensical statements. Tussionex and Robitussin DM were  discontinued as them may be contributing to her delirium. Will focus on mobilizing patient. I have also discussed with nursing and placed an order in support of her spouse spending the night to help keep her calm. Plan to resume PT/OT today. She is improved, but continues to have hallucinations.   Hyperlipidemia: Continue with atorvastatin.    AKI with Hypokalemia/ hyperkalemia on CKD II: Baseline creatinine 1.10 as of 02/2020,  05/28/2020 at 1.20 with K at 4.6 and bicarbonate at 36. Continue current diuretic regimen with torsemide 40 mg bid, to keep negative fluid balance.    Depression: continue with  duloxetine.  Morbid Obesity Class III: Complicates all cares. BMI calculated at 42.   I have seen and examined this patient myself. I have spent 30 minutes in her evaluation and care.    DVT prophylaxis:      apixaban   Code Status:               full  Family Communication:       None available today. Disposition:    Status is: Inpatient   Remains inpatient appropriate because:Inpatient level of care appropriate due to severity of illness   Dispo: The patient is from: Home              Anticipated d/c is to: Home              Anticipated d/c date is: 2 days              Patient currently is not medically stable to d/c.  Valena Ivanov, DO [facility] Direct contact: see [email or web address]  7PM-7AM contact night coverage as above 05/30/2020, 2:48 PM  LOS: 7 days

## 2020-05-31 DIAGNOSIS — I484 Atypical atrial flutter: Secondary | ICD-10-CM | POA: Diagnosis not present

## 2020-05-31 DIAGNOSIS — J9601 Acute respiratory failure with hypoxia: Secondary | ICD-10-CM | POA: Diagnosis not present

## 2020-05-31 DIAGNOSIS — I2510 Atherosclerotic heart disease of native coronary artery without angina pectoris: Secondary | ICD-10-CM | POA: Diagnosis not present

## 2020-05-31 DIAGNOSIS — I10 Essential (primary) hypertension: Secondary | ICD-10-CM | POA: Diagnosis not present

## 2020-05-31 LAB — GLUCOSE, CAPILLARY
Glucose-Capillary: 145 mg/dL — ABNORMAL HIGH (ref 70–99)
Glucose-Capillary: 303 mg/dL — ABNORMAL HIGH (ref 70–99)
Glucose-Capillary: 363 mg/dL — ABNORMAL HIGH (ref 70–99)
Glucose-Capillary: 278 mg/dL — ABNORMAL HIGH (ref 70–99)

## 2020-05-31 LAB — BASIC METABOLIC PANEL
Anion gap: 13 (ref 5–15)
BUN: 26 mg/dL — ABNORMAL HIGH (ref 8–23)
CO2: 29 mmol/L (ref 22–32)
Calcium: 8.9 mg/dL (ref 8.9–10.3)
Chloride: 97 mmol/L — ABNORMAL LOW (ref 98–111)
Creatinine, Ser: 1.25 mg/dL — ABNORMAL HIGH (ref 0.44–1.00)
GFR, Estimated: 45 mL/min — ABNORMAL LOW (ref >60)
Glucose, Bld: 270 mg/dL — ABNORMAL HIGH (ref 70–99)
Potassium: 3.7 mmol/L (ref 3.5–5.1)
Sodium: 139 mmol/L (ref 135–145)

## 2020-05-31 LAB — MAGNESIUM: Magnesium: 2.1 mg/dL (ref 1.7–2.4)

## 2020-05-31 MED ORDER — POTASSIUM CHLORIDE CRYS ER 20 MEQ PO TBCR
60.0000 meq | EXTENDED_RELEASE_TABLET | Freq: Once | ORAL | Status: AC
  Administered 2020-05-31: 60 meq via ORAL
  Filled 2020-05-31: qty 3

## 2020-05-31 MED ORDER — INSULIN ASPART 100 UNIT/ML ~~LOC~~ SOLN
8.0000 [IU] | Freq: Once | SUBCUTANEOUS | Status: AC
  Administered 2020-05-31: 8 [IU] via SUBCUTANEOUS

## 2020-05-31 MED ORDER — PREDNISONE 20 MG PO TABS
60.0000 mg | ORAL_TABLET | Freq: Every day | ORAL | Status: DC
  Administered 2020-06-01 – 2020-06-02 (×2): 60 mg via ORAL
  Filled 2020-05-31 (×2): qty 3

## 2020-05-31 NOTE — Care Management Important Message (Signed)
Important Message  Patient Details  Name: [name] MRN: [id number] Date of Birth: 09-12-1945   Medicare Important Message Given:  Yes     Barb Merino [name] 05/31/2020, 3:22 PM

## 2020-05-31 NOTE — Progress Notes (Signed)
Pharmacy: Dofetilide (Tikosyn) - Follow Up Assessment and Electrolyte Replacement  Pharmacy consulted to assist in monitoring and replacing electrolytes in this 74 y.o. female admitted on 05/19/2020 continuing dofetilide 250mg  BID from PTA.  Labs:    Component Value Date/Time   K 3.7 05/31/2020 1004   MG 2.1 05/31/2020 1004     Plan: Potassium: K 3.5-3.7:  Give KCl 60 mEq po x1   Magnesium: Mg > 2: No additional supplementation needed  Thank you for allowing pharmacy to participate in this patient's care   Bertis Ruddy, PharmD Clinical Pharmacist Please check AMION for all [facility] numbers 05/31/2020 12:09 PM

## 2020-05-31 NOTE — Progress Notes (Addendum)
Inpatient Diabetes Program Recommendations  AACE/ADA: New Consensus Statement on Inpatient Glycemic Control (2015)  Target Ranges:  Prepandial:   less than 140 mg/dL      Peak postprandial:   less than 180 mg/dL (1-2 hours)      Critically ill patients:  140 - 180 mg/dL   Results for ARYIANNA, EARWOOD (MRN [id number]) as of 05/31/2020 14:28  Ref. Range 05/30/2020 07:31 05/30/2020 12:32 05/30/2020 17:24 05/30/2020 20:14 05/30/2020 21:16  Glucose-Capillary Latest Ref Range: 70 - 99 mg/dL 144 (H)  3 units NOVOLOG  12 units LANTUS  207 (H)  7 units NOVOLOG  264 (H)  11 units NOVOLOG  303 (H) 284 (H)   Results for DEKOTA, KIRLIN (MRN [id number]) as of 05/31/2020 14:28  Ref. Range 05/31/2020 07:35 05/31/2020 11:19  Glucose-Capillary Latest Ref Range: 70 - 99 mg/dL 145 (H)  3 units NOVOLOG  12 units LANTUS  303 (H)  15 units NOVOLOG      Home DM Meds: None  Current Orders: Lantus 12 units Daily      Novolog Resistant Correction Scale/ SSI (0-20 units) TID AC      MD- Note Solumedrol stopped this AM.  Will start Prednisone tomorrow AM.  Note that patient having significant afternoon CBG elevations.  If this trend continues despite reduction of steroids, please consider adding Novolog Meal Coverage: Novolog 4 units TID with meals to start  (Please add the following Hold Parameters: Hold if pt eats <50% of meal, Hold if pt NPO)    --Will follow patient during hospitalization--  Wyn Quaker RN, MSN, CDE Diabetes Coordinator Inpatient Glycemic Control Team Team Pager: [phone] (8a-5p)

## 2020-05-31 NOTE — Progress Notes (Signed)
PROGRESS NOTE  [name] BHA:[id number] DOB: 1945-11-29 DOA: 05/19/2020 PCP: [name], MD  Brief History   [name] was admitted to the hospital with the working diagnosis of acute hypoxic respiratory failure related to acute diastolic heart failure decompensation complicated with atrial fibrillation with RVR in the setting of acute bronchitis/ viral pneumonia due to RSV.   73 year old female past medical history for obesity class III, diastolic heart failure, coronary artery disease status post angioplasty, atrial fibrillation, pulmonary hypertension, who presented with dyspnea. Patient reported 5 days of dry cough, refractory to outpatient management with oral antibiotics.  She had persistent and worsening symptoms that prompted her to come to the hospital.  On her initial physical examination her oximetry was 82% on 6 L supplemental oxygen, blood pressure 122/58, heart rate 93, respiratory rate 30, oxygen saturation 90%, she had coarse breath sounds bilaterally, diffuse wheezing and rhonchi, heart S1-S2, present, tachycardic, abdomen soft, no lower extremity edema.   Sodium 136, potassium 3.1, chloride 88, bicarb 31, glucose one 68, BUN 27, creatinine 1.52, troponin I 20, white count 8.2, hemoglobin 14.9, hematocrit 45.8, platelets 215.  Respiratory panel positive for RSV.  SARS COVID-19 negative. Chest radiograph with congested hilar vasculature bilaterally.  No infiltrates. Chest CT with bilateral groundglass opacities. EKG 98 bpm, normal axis, first-degree AV block, sinus rhythm, Q waves in V1-V2, no significant ST segment or T wave changes.    Patient was placed on supplemental 02 per non re-breather and broad spectrum antibiotic therapy,   Developed atrial fibrillation with RVR and place on IV diltiazem.    Started on aggressive diuresis with IV furosemide.    Transitioned to oral AV blockade, added metoprolol for better rate control.    Old records personally reviewed: She  follows with Dr. Cheryln Manly as outpatient, has diagnosis ofd tachybradycardia syndrome sp pacemaker ([place] dual chamber), sp ablation x3 for her atrial fibrillation (last on 09/20).  Plan to repeat ablation.    Continue slow recovery, with improving symptoms and decreasing oxygen requirements.   Pt with confusion and agitation overnight 11/24-25 and in the early morning hours. Delirum causing medications have been stopped. CT head is pending. No obvious metabolic cause for this change. Likely delirium. I have placed an order in support of the patient's husband staying overnight. I have discussed the patient in detail with her husband. All questions answered to the best of my ability.  The patient's mental status has improved, although she continues to hallucinate occasionally. She did fall last night when she got out of bed without asking for assistance. Her oxygen requirements are down to 4L by HFNC from 15 by HFNC on 05/27/2020. Monitor. Continue diuresis.  On 05/29/2020 the patient's heart rate was elevated, but responded to Diltiazem. She is now on oral diltiazem.  Consultants  . PCCM . Cardiology  Procedures  . None  Antibiotics   Anti-infectives (From admission, onward)   Start     Dose/Rate Route Frequency Ordered Stop   05/20/20 1500  cefTRIAXone (ROCEPHIN) 1 g in sodium chloride 0.9 % 100 mL IVPB  Status:  Discontinued        1 g 200 mL/hr over 30 Minutes Intravenous Every 24 hours 05/19/20 1831 05/20/20 1607   05/20/20 1000  doxycycline (VIBRAMYCIN) 100 mg in sodium chloride 0.9 % 250 mL IVPB  Status:  Discontinued        100 mg 125 mL/hr over 120 Minutes Intravenous Every 12 hours 05/19/20 1838 05/20/20 1607  05/20/20 0000  azithromycin (ZITHROMAX) 500 mg in sodium chloride 0.9 % 250 mL IVPB  Status:  Discontinued        500 mg 250 mL/hr over 60 Minutes Intravenous Every 24 hours 05/19/20 1831 05/19/20 1838   05/19/20 1515  cefTRIAXone (ROCEPHIN) 1 g in sodium chloride 0.9  % 100 mL IVPB        1 g 200 mL/hr over 30 Minutes Intravenous  Once 05/19/20 1506 05/19/20 1620   05/19/20 1515  azithromycin (ZITHROMAX) 500 mg in sodium chloride 0.9 % 250 mL IVPB        500 mg 250 mL/hr over 60 Minutes Intravenous  Once 05/19/20 1506 05/19/20 1733     Subjective  The patient is sitting up at bedside. No new complaints.  Objective   Vitals:  Vitals:   05/31/20 0751 05/31/20 0851  BP: 114/90   Pulse: (!) 109 91  Resp:  18  Temp:    SpO2:  93%    Exam:  Constitutional:  . The patient is awake, alert, and oriented x 3. No acute distress. Respiratory:  . No increased work of breathing. . No wheezes or rhonchi . Diminished breath sounds throughout. . No tactile fremitus Cardiovascular:  . Regular rate and rhythm . No murmurs, ectopy, or gallups. . No lateral PMI. No thrills. Abdomen:  . Abdomen is soft, non-tender, non-distended . No hernias, masses, or organomegaly . Normoactive bowel sounds.  Musculoskeletal:  . No cyanosis, clubbing, or edema Skin:  . No rashes, lesions, ulcers . palpation of skin: no induration or nodules Neurologic:  . CN 2-12 intact . Sensation all 4 extremities intact Psychiatric:  . Mental status o Mood, affect appropriate o Orientation to person, place, time  . judgment and insight appear intact  I have personally reviewed the following:   Today's Data  . Vitals, BMP, glucoses  Micro Data  . Respiratory Viral Panel was positive for Parainfluenza Virus 4 and RSV.   Imaging  . CT chest without contrast: Mediastinal and probable bilateral hilar adenopathy unchanged since 2019. [name] Peribronchial thickeninc and areas of atelectasis and ground glass opacities throughout the upper lobes. Early bronchopneumonia in RUL and superior segment of right lower lobe possible. CAD and aorta atherosclerosis . CXR 05/27/2020.  Cardiology Data  . EKG: Atrial tachycardia with 2:1 conduction.  Scheduled Meds: . (feeding  supplement) PROSource Plus  30 mL Oral BID BM  . acetaminophen  650 mg Oral Q6H  . apixaban  5 mg Oral BID  . arformoterol  15 mcg Nebulization BID  . vitamin C  1,000 mg Oral QHS  . atorvastatin  40 mg Oral Daily  . budesonide (PULMICORT) nebulizer solution  2 mg Nebulization BID  . calcium-vitamin D  1 tablet Oral Daily  . diltiazem  360 mg Oral Daily  . dofetilide  250 mcg Oral BID  . DULoxetine  60 mg Oral Daily  . feeding supplement (GLUCERNA SHAKE)  237 mL Oral TID BM  . fluticasone  1 spray Each Nare BID  . guaiFENesin  1,200 mg Oral BID  . insulin aspart  0-20 Units Subcutaneous TID WC  . insulin glargine  12 Units Subcutaneous Daily  . loratadine  10 mg Oral Daily  . metoprolol tartrate  50 mg Oral BID  . multivitamin with minerals  1 tablet Oral Daily  . pantoprazole  40 mg Oral Daily  . [START ON 06/01/2020] predniSONE  60 mg Oral Q breakfast  . sodium chloride  2 spray Each Nare Q6H  . sodium chloride flush  3 mL Intravenous Q12H  . torsemide  40 mg Oral BID  . vitamin B-12  5,000 mcg Oral QHS  . Vitamin E  800 Units Oral QHS   And  . vitamin E  200 Units Oral QHS   Continuous Infusions: . sodium chloride      Principal Problem:   Acute respiratory failure (HCC) Active Problems:   Essential hypertension   CAD S/P percutaneous coronary angioplasty   Paroxysmal atrial fibrillation (HCC)   Pulmonary hypertension (HCC)   Severe obesity (BMI >= 40) (HCC)   Hyperlipidemia   Persistent atrial fibrillation (HCC)   Atypical atrial flutter (HCC)   Hypotension   Hypokalemia   Type 2 diabetes mellitus with hyperlipidemia (HCC)   LOS: 12 days   A & P  Acute hypoxemic respiratory failure due to decompensated diastolic heart failure with acute cardiogenic pulmonary edema. Pulmonary hypertension. Echocardiogram with preserved LV systolic function EF 55 to 60%, elevated PA pressure 63 mmHg systolic. The patient is currently saturating at 94% on 3L by nasal cannula.  Report on repeat CXR states that pulmonary vascular congestion is improved over previous. Wean as possible. Continue diuresis and strict I's and O's. Continue current diuretic regimen with torsemide 40 mg bid, to keep negative fluid balance. Today the patient is 7.5 L negative in terms of fluid balance.    Atrial fibrillation with rapid ventricular response: The patient has converted to 2:1 flutter, rate in the 70's. Continue telemetry. Continue rate control with metoprolol 50 mg bid, diltiazem 360 mg and dofetilide.  Continue antithrombotic therapy with apixaban.    RSV bronchitis/ viral pneumonia (present on admission): Oxygenation status slow to improve.Improving submandibular painful lymphadenopathy. Continue to have mild expiratory wheezing.  The patient is being managed on inhaled LABA and inhaled corticosteroids. Encourage out of bed to chair tid with meals, pt and ot. She is receiving nasal flunisolide as well as guaifenesin and a flutter valve. Steroid started. Robitussin DM and Tussionex stopped and Tessalon perles started in their place as them may be contributing to the patient's delirium. Incentive spirometer at bedside to be used 6x an hour while awake. I have added steroids to help reduce airway inflammation. Steroids are being weaned.    T2DM uncontrolled with hyperglycemia/ dyslipidemia. Hypoglycemia in the last 24 hours. Glucoses are high due to the effect of steroids. They have run 145- 303.  FSBS is followed with SSI.   Delirium/Sundowning:resolved. Patient denies any further hallucinations. Patient spouse badly disturbed by a phone call from the patient this morning accusing him of abandoning her at a hotel without money or transportation. CT head ordered. It has demonstrated only atrophy and ethmoid sinus disease.  The patient was completely alert and oriented on my visit, although nursing states that she has come out with some non-sensical statements. Tussionex and Robitussin DM were  discontinued as them may be contributing to her delirium. Will focus on mobilizing patient. I have also discussed with nursing and placed an order in support of her spouse spending the night to help keep her calm. Plan to resume PT/OT today. She is improved, but continues to have hallucinations.   Hyperlipidemia: Continue with atorvastatin.    AKI with Hypokalemia/ hyperkalemia on CKD II: Baseline creatinine 1.10 as of 02/2020,  05/28/2020 at 1.20 with K at 4.6 and bicarbonate at 36. Continue current diuretic regimen with torsemide 40 mg bid, to keep negative fluid balance.  Depression: continue with duloxetine.  Morbid Obesity Class III: Complicates all cares. BMI calculated at 42.   I have seen and examined this patient myself. I have spent 34 minutes in her evaluation and care.    DVT prophylaxis:      apixaban   Code Status:               full  Family Communication:       None available today. Disposition:    Status is: Inpatient   Remains inpatient appropriate because:Inpatient level of care appropriate due to severity of illness   Dispo: The patient is from: Home              Anticipated d/c is to: Home              Anticipated d/c date is: 2 days              Patient currently is not medically stable to d/c.  Hovanes Hymas, DO [facility] Direct contact: see [email or web address]  7PM-7AM contact night coverage as above 05/31/2020, 2:39 PM  LOS: 7 days

## 2020-05-31 NOTE — Progress Notes (Signed)
Physical Therapy Treatment Patient Details Name: [name] MRN: [id number] DOB: 14-Sep-1945 Today's Date: 05/31/2020    History of Present Illness 74 y.o. female with medical history significant for morbid obesity, congestive heart failure, coronary artery disease status post stent angioplasty, history of A. fib status post ablation on chronic anticoagulation therapy with Eliquis, pulmonary hypertension status post pacemaker insertion who presents to the ER on 11/17 for worsening shortness of breath. Workup for acute on chronic diastolic heart failure, RSV.    PT Comments    Pt progressing with mobility, and tolerating cardiopulmonary challenges during gait including bouts of fast walking and increased overall gait distance. Pt with SpO2 at 96% on 3LO2 mid-gait, with DOE 2/4 but recovered dyspnea with periods of slower walking. PT to continue to follow acutely.    Follow Up Recommendations  No PT follow up;Supervision for mobility/OOB     Equipment Recommendations  None recommended by PT    Recommendations for Other Services       Precautions / Restrictions Precautions Precautions: Fall Precaution Comments: monitor O2 Restrictions Weight Bearing Restrictions: No    Mobility  Bed Mobility               General bed mobility comments: in recliner upon PT arrival to room  Transfers Overall transfer level: Modified independent   Transfers: Sit to/from Stand Sit to Stand: Modified independent (Device/Increase time)         General transfer comment: Mod I for increased time, no physical assist  Ambulation/Gait Ambulation/Gait assistance: Supervision Gait Distance (Feet): 750 Feet Assistive device: Rolling walker (2 wheeled) Gait Pattern/deviations: Step-through pattern Gait velocity: slightly decr   General Gait Details: supervision for safety, RW utilized for light steadying assist. Intermittent fast gait for ~25 ft bouts x5 reps.   Stairs              Wheelchair Mobility    Modified Rankin (Stroke Patients Only)       Balance Overall balance assessment: No apparent balance deficits (not formally assessed)                                          Cognition Arousal/Alertness: Awake/alert Behavior During Therapy: WFL for tasks assessed/performed Overall Cognitive Status: Within Functional Limits for tasks assessed                                        Exercises      General Comments General comments (skin integrity, edema, etc.): DOE 2/4, SpO2 96% on 3LO2. HR 83-103 bpm      Pertinent Vitals/Pain Pain Assessment: No/denies pain    Home Living                      Prior Function            PT Goals (current goals can now be found in the care plan section) Acute Rehab PT Goals Patient Stated Goal: to breathe better so I can walk in the parks PT Goal Formulation: With patient Time For Goal Achievement: 06/03/20 Potential to Achieve Goals: Good Progress towards PT goals: Progressing toward goals    Frequency    Min 3X/week      PT Plan Current plan remains appropriate    Co-evaluation  AM-PAC PT "6 Clicks" Mobility   Outcome Measure  Help needed turning from your back to your side while in a flat bed without using bedrails?: None Help needed moving from lying on your back to sitting on the side of a flat bed without using bedrails?: None Help needed moving to and from a bed to a chair (including a wheelchair)?: None Help needed standing up from a chair using your arms (e.g., wheelchair or bedside chair)?: None Help needed to walk in hospital room?: None Help needed climbing 3-5 steps with a railing? : A Little 6 Click Score: 23    End of Session Equipment Utilized During Treatment: Oxygen Activity Tolerance: Patient tolerated treatment well Patient left: with call bell/phone within reach;in chair Nurse Communication: Mobility  status PT Visit Diagnosis: Other abnormalities of gait and mobility (R26.89)     Time: 7741-4239 PT Time Calculation (min) (ACUTE ONLY): 20 min  Charges:  $Gait Training: 8-22 mins                     Joal Eakle E, PT Acute Rehabilitation Services Pager [phone]  Office [phone]    Rolonda Pontarelli D [place] 05/31/2020, 10:30 AM

## 2020-05-31 NOTE — Progress Notes (Signed)
Occupational Therapy Treatment Patient Details Name: [name] MRN: [id number] DOB: 1945-09-28 Today's Date: 05/31/2020    History of present illness 74 y.o. female with medical history significant for morbid obesity, congestive heart failure, coronary artery disease status post stent angioplasty, history of A. fib status post ablation on chronic anticoagulation therapy with Eliquis, pulmonary hypertension status post pacemaker insertion who presents to the ER on 11/17 for worsening shortness of breath. Workup for acute on chronic diastolic heart failure, RSV.   OT comments  Pt making good progress towards OT goals this session. Pt on 3L [place] upon arrival with O2 98%. Pt continues to present with impaired activity tolerance and slight dyspnea with exertion, however pt making good improvements this session. Pt able to complete light IADL task of making bed as precursor to higher level functional mobility tasks. Pt completed task MOD I needing increased time for task but no physical assist needed. Pt doffed O2 during task with O2 >92% during task. Education initiated on energy conservation strategies for home with pt able to provide 2 examples that she was using before. DC plan remains appropriate, will follow acutely per POC.    Follow Up Recommendations  No OT follow up;Supervision/Assistance - 24 hour    Equipment Recommendations  None recommended by OT    Recommendations for Other Services      Precautions / Restrictions Precautions Precautions: Fall Precaution Comments: monitor O2 Restrictions Weight Bearing Restrictions: No       Mobility Bed Mobility               General bed mobility comments: pt in recliner upon arrival  Transfers Overall transfer level: Modified independent Equipment used: None Transfers: Sit to/from Stand Sit to Stand: Modified independent (Device/Increase time)         General transfer comment: Mod I for increased time, no physical  assist    Balance Overall balance assessment: No apparent balance deficits (not formally assessed)                                         ADL either performed or assessed with clinical judgement   ADL                           Toilet Transfer: Supervision/safety;Ambulation Toilet Transfer Details (indicate cue type and reason): simulated via functional mobility         Functional mobility during ADLs: Supervision/safety General ADL Comments: pt continues to present with decreased activity tolerance, slight dyspnea with exertion on RA however pt making good improvements able to complete IADL tasks with supervision on RA with O2 >90%     Vision       Perception     Praxis      Cognition Arousal/Alertness: Awake/alert Behavior During Therapy: WFL for tasks assessed/performed Overall Cognitive Status: Within Functional Limits for tasks assessed                                          Exercises     Shoulder Instructions       General Comments pt on 3L O2 upon arrival with O2 WFL. pt doffed O2 for functional activities with O2 maintaining >92%. education intiated on ECS with pt open to education  Pertinent Vitals/ Pain       Pain Assessment: No/denies pain  Home Living                                          Prior Functioning/Environment              Frequency  Min 2X/week        Progress Toward Goals  OT Goals(current goals can now be found in the care plan section)  Progress towards OT goals: Progressing toward goals  Acute Rehab OT Goals Patient Stated Goal: to breathe better so I can walk in the parks OT Goal Formulation: With patient/family Time For Goal Achievement: 06/03/20 Potential to Achieve Goals: Good  Plan Discharge plan remains appropriate;Frequency remains appropriate    Co-evaluation                 AM-PAC OT "6 Clicks" Daily Activity     Outcome  Measure   Help from another person eating meals?: None Help from another person taking care of personal grooming?: None Help from another person toileting, which includes using toliet, bedpan, or urinal?: None Help from another person bathing (including washing, rinsing, drying)?: A Little Help from another person to put on and taking off regular upper body clothing?: None Help from another person to put on and taking off regular lower body clothing?: None 6 Click Score: 23    End of Session Equipment Utilized During Treatment: Oxygen;Other (comment) (3L)  OT Visit Diagnosis: Muscle weakness (generalized) (M62.81)   Activity Tolerance Patient tolerated treatment well   Patient Left in bed;with chair alarm set;Other (comment) (sitting EOB)   Nurse Communication          Time: 5374-8270 OT Time Calculation (min): 27 min  Charges: OT General Charges $OT Visit: 1 Visit OT Treatments $Self Care/Home Management : 23-37 mins  Lanier Clam., COTA/L Acute Rehabilitation Services [phone] [place] 05/31/2020, 4:04 PM

## 2020-06-01 DIAGNOSIS — I2510 Atherosclerotic heart disease of native coronary artery without angina pectoris: Secondary | ICD-10-CM | POA: Diagnosis not present

## 2020-06-01 DIAGNOSIS — J9601 Acute respiratory failure with hypoxia: Secondary | ICD-10-CM | POA: Diagnosis not present

## 2020-06-01 DIAGNOSIS — I10 Essential (primary) hypertension: Secondary | ICD-10-CM | POA: Diagnosis not present

## 2020-06-01 DIAGNOSIS — I484 Atypical atrial flutter: Secondary | ICD-10-CM | POA: Diagnosis not present

## 2020-06-01 LAB — GLUCOSE, CAPILLARY
Glucose-Capillary: 191 mg/dL — ABNORMAL HIGH (ref 70–99)
Glucose-Capillary: 160 mg/dL — ABNORMAL HIGH (ref 70–99)
Glucose-Capillary: 265 mg/dL — ABNORMAL HIGH (ref 70–99)
Glucose-Capillary: 514 mg/dL (ref 70–99)

## 2020-06-01 LAB — BASIC METABOLIC PANEL
Anion gap: 14 (ref 5–15)
BUN: 27 mg/dL — ABNORMAL HIGH (ref 8–23)
CO2: 29 mmol/L (ref 22–32)
Calcium: 9.2 mg/dL (ref 8.9–10.3)
Chloride: 95 mmol/L — ABNORMAL LOW (ref 98–111)
Creatinine, Ser: 1.33 mg/dL — ABNORMAL HIGH (ref 0.44–1.00)
GFR, Estimated: 42 mL/min — ABNORMAL LOW (ref >60)
Glucose, Bld: 195 mg/dL — ABNORMAL HIGH (ref 70–99)
Potassium: 3.6 mmol/L (ref 3.5–5.1)
Sodium: 138 mmol/L (ref 135–145)

## 2020-06-01 MED ORDER — GLUCERNA SHAKE PO LIQD: 237.0000 mL | Freq: Three times a day (TID) | ORAL | Status: DC | PRN

## 2020-06-01 MED ORDER — INSULIN ASPART 100 UNIT/ML ~~LOC~~ SOLN
14.0000 [IU] | Freq: Once | SUBCUTANEOUS | Status: AC
  Administered 2020-06-01: 14 [IU] via SUBCUTANEOUS

## 2020-06-01 MED ORDER — POTASSIUM CHLORIDE CRYS ER 20 MEQ PO TBCR
60.0000 meq | EXTENDED_RELEASE_TABLET | Freq: Once | ORAL | Status: AC
  Administered 2020-06-01: 60 meq via ORAL
  Filled 2020-06-01: qty 3

## 2020-06-01 NOTE — Progress Notes (Signed)
Pharmacy: Dofetilide (Tikosyn) - Follow Up Assessment and Electrolyte Replacement  Pharmacy consulted to assist in monitoring and replacing electrolytes in this 74 y.o. female admitted on 05/19/2020 continuing dofetilide 250mg  BID from PTA.  Labs:    Component Value Date/Time   K 3.6 06/01/2020 0827   MG 2.1 05/31/2020 1004     Plan: Potassium: K 3.5-3.7:  Give KCl 60 mEq po x1   Magnesium: Mg > 2: No additional supplementation needed  Thank you for allowing pharmacy to participate in this patient's care   Bertis Ruddy, PharmD Clinical Pharmacist Please check AMION for all [place] numbers 06/01/2020 9:37 AM

## 2020-06-01 NOTE — Progress Notes (Signed)
Nutrition Follow-up  DOCUMENTATION CODES:   Morbid obesity  INTERVENTION:    Glucerna Shake po TID prn if intake of meals is < 50%, each supplement provides 220 kcal and 10 grams of protein.  D/C Prosource Plus, patient not taking.  NUTRITION DIAGNOSIS:   Increased nutrient needs related to acute illness as evidenced by estimated needs.  Ongoing   GOAL:   Patient will meet greater than or equal to 90% of their needs  Met  MONITOR:   PO intake, Supplement acceptance, Labs, Weight trends, I & O's  REASON FOR ASSESSMENT:   Consult Assessment of nutrition requirement/status  ASSESSMENT:   Pt admitted with acute respiratory failure in setting of RSV pneumonia and underlying mild restrictive/diffusion defects. PMH includes Afib, type 2 DM, HTN, OSA on CPAP, CHF.  Patient reports good intake of meals, she is eating everything she orders. She has not been drinking the Glucerna shakes because she thinks she is eating enough. She has been trying to lose weight at home and does not want to add any extra calories to her diet. She has not been taking the Prosource Plus supplement because she does not like the way it tastes.  Encouraged adequate protein intake. Patient states she has been ordering protein foods with all meals.  Glucose has been elevated due to steroids. Steroids are being weaned. Currently on 3L nasal cannula.  Labs reviewed. BUN 27, creat 1.33 CBG: [phone]  Medications reviewed and include vitamin C, oscal with D, novolog, lantus, MVI with minerals, prednisone, vitamin B-12, vitamin E.  NUTRITION - FOCUSED PHYSICAL EXAM:    Most Recent Value  Orbital Region No depletion  Upper Arm Region No depletion  Thoracic and Lumbar Region No depletion  Buccal Region No depletion  Temple Region No depletion  Clavicle Bone Region No depletion  Clavicle and Acromion Bone Region No depletion  Scapular Bone Region No depletion  Dorsal Hand No depletion   Patellar Region No depletion  Anterior Thigh Region No depletion  Posterior Calf Region No depletion  Edema (RD Assessment) None  Hair Reviewed  Eyes Reviewed  Mouth Reviewed  Skin Reviewed  Nails Reviewed       Diet Order:   Diet Order            Diet Carb Modified Fluid consistency: Thin; Room service appropriate? Yes; Fluid restriction: 1500 mL Fluid  Diet effective now                 EDUCATION NEEDS:   No education needs have been identified at this time  Skin:  Skin Assessment: Reviewed RN Assessment  Last BM:  11/22  Height:   Ht Readings from Last 1 Encounters:  05/20/20 5' 6.5" (1.689 m)    Weight:   Wt Readings from Last 1 Encounters:  06/01/20 116.8 kg    BMI:  Body mass index is 40.94 kg/m.  Estimated Nutritional Needs:   Kcal:  2100-2300  Protein:  130-150 grams  Fluid:  >2L/d    Lucas Mallow, RD, LDN, CNSC Please refer to Amion for contact information.

## 2020-06-01 NOTE — Progress Notes (Signed)
PROGRESS NOTE  [name]:[id number] DOB: 10/05/1945 DOA: 05/19/2020 PCP: [name], MD  Brief History   [name] was admitted to the hospital with the working diagnosis of acute hypoxic respiratory failure related to acute diastolic heart failure decompensation complicated with atrial fibrillation with RVR in the setting of acute bronchitis/ viral pneumonia due to RSV.   74 year old female past medical history for obesity class III, diastolic heart failure, coronary artery disease status post angioplasty, atrial fibrillation, pulmonary hypertension, who presented with dyspnea. Patient reported 5 days of dry cough, refractory to outpatient management with oral antibiotics.  She had persistent and worsening symptoms that prompted her to come to the hospital.  On her initial physical examination her oximetry was 82% on 6 L supplemental oxygen, blood pressure 122/58, heart rate 93, respiratory rate 30, oxygen saturation 90%, she had coarse breath sounds bilaterally, diffuse wheezing and rhonchi, heart S1-S2, present, tachycardic, abdomen soft, no lower extremity edema.   Sodium 136, potassium 3.1, chloride 88, bicarb 31, glucose one 68, BUN 27, creatinine 1.52, troponin I 20, white count 8.2, hemoglobin 14.9, hematocrit 45.8, platelets 215.  Respiratory panel positive for RSV.  SARS COVID-19 negative. Chest radiograph with congested hilar vasculature bilaterally.  No infiltrates. Chest CT with bilateral groundglass opacities. EKG 98 bpm, normal axis, first-degree AV block, sinus rhythm, Q waves in V1-V2, no significant ST segment or T wave changes.    Patient was placed on supplemental 02 per non re-breather and broad spectrum antibiotic therapy,   Developed atrial fibrillation with RVR and place on IV diltiazem.    Started on aggressive diuresis with IV furosemide.    Transitioned to oral AV blockade, added metoprolol for better rate control.    Old records personally reviewed: She  follows with [name] as outpatient, has diagnosis ofd tachybradycardia syndrome sp pacemaker ([place] dual chamber), sp ablation x3 for her atrial fibrillation (last on 09/20).  Plan to repeat ablation.    Continue slow recovery, with improving symptoms and decreasing oxygen requirements.   Pt with confusion and agitation overnight 11/24-25 and in the early morning hours. Delirum causing medications have been stopped. CT head is pending. No obvious metabolic cause for this change. Likely delirium. I have placed an order in support of the patient's husband staying overnight. I have discussed the patient in detail with her husband. All questions answered to the best of my ability.  The patient's mental status has improved, although she continues to hallucinate occasionally. She did fall last night when she got out of bed without asking for assistance. Her oxygen requirements are down to 4L by HF[facility] from 15 by [facility] on 05/27/2020. Monitor. Continue diuresis.  On 05/29/2020 the patient's heart rate was elevated, but responded to Diltiazem. She is now on oral diltiazem. Heart rate is controlled.   Consultants  . PCCM . Cardiology  Procedures  . None  Antibiotics   Anti-infectives (From admission, onward)   Start     Dose/Rate Route Frequency Ordered Stop   05/20/20 1500  cefTRIAXone (ROCEPHIN) 1 g in sodium chloride 0.9 % 100 mL IVPB  Status:  Discontinued        1 g 200 mL/hr over 30 Minutes Intravenous Every 24 hours 05/19/20 1831 05/20/20 1607   05/20/20 1000  doxycycline (VIBRAMYCIN) 100 mg in sodium chloride 0.9 % 250 mL IVPB  Status:  Discontinued        100 mg 125 mL/hr over 120 Minutes Intravenous Every 12 hours 05/19/20  1838 05/20/20 1607   05/20/20 0000  azithromycin (ZITHROMAX) 500 mg in sodium chloride 0.9 % 250 mL IVPB  Status:  Discontinued        500 mg 250 mL/hr over 60 Minutes Intravenous Every 24 hours 05/19/20 1831 05/19/20 1838   05/19/20 1515  cefTRIAXone (ROCEPHIN)  1 g in sodium chloride 0.9 % 100 mL IVPB        1 g 200 mL/hr over 30 Minutes Intravenous  Once 05/19/20 1506 05/19/20 1620   05/19/20 1515  azithromycin (ZITHROMAX) 500 mg in sodium chloride 0.9 % 250 mL IVPB        500 mg 250 mL/hr over 60 Minutes Intravenous  Once 05/19/20 1506 05/19/20 1733     Subjective  The patient is sitting up at bedside. No new complaints.  Objective   Vitals:  Vitals:   06/01/20 0807 06/01/20 1347  BP:  (!) 124/58  Pulse:  80  Resp:  16  Temp:  98.9 F (37.2 C)  SpO2: 97% 92%    Exam:  Constitutional:  . The patient is awake, alert, and oriented x 3. No acute distress. Respiratory:  . No increased work of breathing. . No wheezes or rhonchi . Diminished breath sounds throughout. . No tactile fremitus Cardiovascular:  . Regular rate and rhythm . No murmurs, ectopy, or gallups. . No lateral PMI. No thrills. Abdomen:  . Abdomen is soft, non-tender, non-distended . No hernias, masses, or organomegaly . Normoactive bowel sounds.  Musculoskeletal:  . No cyanosis, clubbing, or edema Skin:  . No rashes, lesions, ulcers . palpation of skin: no induration or nodules Neurologic:  . CN 2-12 intact . Sensation all 4 extremities intact Psychiatric:  . Mental status o Mood, affect appropriate o Orientation to person, place, time  . judgment and insight appear intact  I have personally reviewed the following:   Today's Data  . Vitals, BMP, glucoses  Micro Data  . Respiratory Viral Panel was positive for Parainfluenza Virus 4 and RSV.   Imaging  . CT chest without contrast: Mediastinal and probable bilateral hilar adenopathy unchanged since 2019. [name] Peribronchial thickeninc and areas of atelectasis and ground glass opacities throughout the upper lobes. Early bronchopneumonia in RUL and superior segment of right lower lobe possible. CAD and aorta atherosclerosis . CXR 05/27/2020.  Cardiology Data  . EKG: Atrial tachycardia with 2:1  conduction.  Scheduled Meds: . acetaminophen  650 mg Oral Q6H  . apixaban  5 mg Oral BID  . arformoterol  15 mcg Nebulization BID  . vitamin C  1,000 mg Oral QHS  . atorvastatin  40 mg Oral Daily  . budesonide (PULMICORT) nebulizer solution  2 mg Nebulization BID  . calcium-vitamin D  1 tablet Oral Daily  . diltiazem  360 mg Oral Daily  . dofetilide  250 mcg Oral BID  . DULoxetine  60 mg Oral Daily  . fluticasone  1 spray Each Nare BID  . guaiFENesin  1,200 mg Oral BID  . insulin aspart  0-20 Units Subcutaneous TID WC  . insulin glargine  12 Units Subcutaneous Daily  . loratadine  10 mg Oral Daily  . metoprolol tartrate  50 mg Oral BID  . multivitamin with minerals  1 tablet Oral Daily  . pantoprazole  40 mg Oral Daily  . predniSONE  60 mg Oral Q breakfast  . sodium chloride  2 spray Each Nare Q6H  . sodium chloride flush  3 mL Intravenous Q12H  . torsemide  40 mg Oral BID  . vitamin B-12  5,000 mcg Oral QHS  . Vitamin E  800 Units Oral QHS   And  . vitamin E  200 Units Oral QHS   Continuous Infusions: . sodium chloride      Principal Problem:   Acute respiratory failure (HCC) Active Problems:   Essential hypertension   CAD S/P percutaneous coronary angioplasty   Paroxysmal atrial fibrillation (HCC)   Pulmonary hypertension (HCC)   Severe obesity (BMI >= 40) (HCC)   Hyperlipidemia   Persistent atrial fibrillation (HCC)   Atypical atrial flutter (HCC)   Hypotension   Hypokalemia   Type 2 diabetes mellitus with hyperlipidemia (HCC)   LOS: 13 days   A & P  Acute hypoxemic respiratory failure due to decompensated diastolic heart failure with acute cardiogenic pulmonary edema. Pulmonary hypertension. Echocardiogram with preserved LV systolic function EF 55 to 60%, elevated PA pressure 63 mmHg systolic. The patient is currently saturating at 92% on Room Air. Report on repeat CXR states that pulmonary vascular congestion is improved over previous. Wean as possible.  Continue diuresis and strict I's and O's. Continue current diuretic regimen with torsemide 40 mg bid, to keep negative fluid balance. Today the patient is 7.5 L negative in terms of fluid balance. Will check ambulatory SaO2 in am and dc patient if she is able to maintain saturations in the 90's while walking on room air.   Atrial fibrillation with rapid ventricular response: The patient has converted to 2:1 flutter, rate in the 70's. Continue telemetry. Continue rate control with metoprolol 50 mg bid, diltiazem 360 mg and dofetilide.  Continue antithrombotic therapy with apixaban.    RSV bronchitis/ viral pneumonia (present on admission): Oxygenation status slow to improve.Improving submandibular painful lymphadenopathy. Continue to have mild expiratory wheezing.  The patient is being managed on inhaled LABA and inhaled corticosteroids. Encourage out of bed to chair tid with meals, pt and ot. She is receiving nasal flunisolide as well as guaifenesin and a flutter valve. Steroid started. Robitussin DM and Tussionex stopped and Tessalon perles started in their place as them may be contributing to the patient's delirium. Incentive spirometer at bedside to be used 6x an hour while awake. I have added steroids to help reduce airway inflammation. Steroids are being tapered.   T2DM uncontrolled with hyperglycemia/ dyslipidemia. Hypoglycemia in the last 24 hours. Glucoses are high due to the effect of steroids. They have run 160- 363.  FSBS is followed with [facility].   Delirium/Sundowning: Resolved. Patient denies any further hallucinations. Patient spouse badly disturbed by a phone call from the patient this morning accusing him of abandoning her at a hotel without money or transportation. CT head ordered. It has demonstrated only atrophy and ethmoid sinus disease.  The patient was completely alert and oriented on my visit, although nursing states that she has come out with some non-sensical statements. Tussionex and  Robitussin DM were discontinued as them may be contributing to her delirium. Will focus on mobilizing patient. I have also discussed with nursing and placed an order in support of her spouse spending the night to help keep her calm. Plan to resume PT/OT today. She is improved, but continues to have hallucinations.   Hyperlipidemia: Continue with atorvastatin.    AKI with Hypokalemia/ hyperkalemia on CKD II: Baseline creatinine 1.10 as of 02/2020,  06/01/2020 at 1.33 with K at 3.6 and bicarbonate at 29. Continue current diuretic regimen with torsemide 40 mg bid, to keep negative fluid balance.  Depression: continue with duloxetine.  Morbid Obesity Class III: Complicates all cares. BMI calculated at 42.   I have seen and examined this patient myself. I have spent 32 minutes in her evaluation and care.    DVT prophylaxis:      apixaban   Code Status:               full  Family Communication:       None available today. Disposition:    Status is: Inpatient   Remains inpatient appropriate because:Inpatient level of care appropriate due to severity of illness   Dispo: The patient is from: Home              Anticipated d/c is to: Home              Anticipated d/c date is: 2 days              Patient currently is not medically stable to d/c.  Genevive Printup, DO [facility] Direct contact: see [email or web address]  7PM-7AM contact night coverage as above 06/01/2020, 2:20 PM  LOS: 7 days

## 2020-06-01 NOTE — Plan of Care (Signed)
  Problem: Education: Goal: Knowledge of General Education information will improve Description Including pain rating scale, medication(s)/side effects and non-pharmacologic comfort measures Outcome: Progressing   

## 2020-06-02 ENCOUNTER — Telehealth: Payer: Self-pay | Admitting: Internal Medicine

## 2020-06-02 LAB — BASIC METABOLIC PANEL
Anion gap: 13 (ref 5–15)
BUN: 32 mg/dL — ABNORMAL HIGH (ref 8–23)
CO2: 30 mmol/L (ref 22–32)
Calcium: 9.2 mg/dL (ref 8.9–10.3)
Chloride: 97 mmol/L — ABNORMAL LOW (ref 98–111)
Creatinine, Ser: 1.57 mg/dL — ABNORMAL HIGH (ref 0.44–1.00)
GFR, Estimated: 34 mL/min — ABNORMAL LOW (ref >60)
Glucose, Bld: 146 mg/dL — ABNORMAL HIGH (ref 70–99)
Potassium: 3.4 mmol/L — ABNORMAL LOW (ref 3.5–5.1)
Sodium: 140 mmol/L (ref 135–145)

## 2020-06-02 LAB — GLUCOSE, CAPILLARY
Glucose-Capillary: 113 mg/dL — ABNORMAL HIGH (ref 70–99)
Glucose-Capillary: 128 mg/dL — ABNORMAL HIGH (ref 70–99)

## 2020-06-02 MED ORDER — PREDNISONE 20 MG PO TABS: ORAL_TABLET | ORAL | 0 refills | Status: DC

## 2020-06-02 MED ORDER — METOPROLOL TARTRATE 50 MG PO TABS
50.0000 mg | ORAL_TABLET | Freq: Two times a day (BID) | ORAL | 1 refills | Status: DC
Start: 2020-06-02 — End: 2020-07-29

## 2020-06-02 MED ORDER — POTASSIUM CHLORIDE CRYS ER 20 MEQ PO TBCR
20.0000 meq | EXTENDED_RELEASE_TABLET | Freq: Once | ORAL | Status: AC
  Administered 2020-06-02: 20 meq via ORAL
  Filled 2020-06-02: qty 1

## 2020-06-02 MED ORDER — BENZONATATE 100 MG PO CAPS: 100.0000 mg | ORAL_CAPSULE | Freq: Three times a day (TID) | ORAL | 0 refills | Status: DC | PRN

## 2020-06-02 NOTE — Discharge Summary (Signed)
[facility]  Physician Discharge Summary   Patient ID: [name] MRN: [id number] DOB/AGE: 1946/06/02 74 y.o.  Admit date: 05/19/2020 Discharge date: 06/02/2020  PCP: [name], MD  DISCHARGE DIAGNOSES:  Acute on chronic diastolic CHF Pulmonary hypertension Atrial fibrillation with RVR RSV bronchitis Hyperglycemia due to steroid Delirium, resolved Hyperlipidemia Chronic kidney disease stage II History of depression Morbid obesity  RECOMMENDATIONS FOR OUTPATIENT FOLLOW UP: 1. Outpatient follow-up with PCP 2. Will need blood work to check renal function at follow-up 3. Will need further management for elevated HbA1c.    Home Health: None Equipment/Devices: None  CODE STATUS: DNR  DISCHARGE CONDITION: fair  Diet recommendation: Modified carbohydrate  INITIAL HISTORY: [name] was admitted to the hospitalwith theworking diagnosis of acute hypoxic respiratory failurerelated to acute diastolic heart failuredecompensationcomplicated withatrial fibrillation with RVR in the setting of acute bronchitis/ viral pneumoniadue to RSV.  74 year old female past medical history for obesity class III, diastolic heart failure, coronary artery disease status post angioplasty, atrial fibrillation, pulmonary hypertension, who presented with dyspnea. Patient reported 5 days of dry cough, refractory to outpatient management with oral antibiotics. She had persistent and worsening symptoms that prompted her to come to the hospital.On her initial physical examination her oximetry was 82% on 6 L supplemental oxygen, blood pressure 122/58, heart rate93, respiratory rate30, oxygen saturation 90%,she had coarse breath sounds bilaterally, diffuse wheezing and rhonchi, heart S1-S2, present, tachycardic, abdomen soft, no lower extremity edema.  [id number], potassium 3.1, chloride88, bicarb31, glucose one68, BUN27, creatinine 1.52, troponin I20, white count 8.2,  hemoglobin 14.9, hematocrit 45.8, platelets215. Respiratory panel positive for RSV. SARS COVID-19 negative. Chest radiograph with congested hilar vasculature bilaterally. No infiltrates. Chest CT with bilateral groundglass opacities. EKG 98 bpm, normal axis, first-degree AV block, sinus rhythm, Q waves in V1-V2, no significant ST segment or T wave changes.  Patient was placed on supplemental 02 per non re-breather and broad spectrum antibiotic therapy,  Developed atrial fibrillation with RVR and place on IV diltiazem.  Started on aggressive diuresis withIVfurosemide.  Transitioned to oral AV blockade, added metoprolol for better rate control.  Old records personally reviewed: She follows with Dr. Cheryln Manly as outpatient, has diagnosis ofd tachybradycardia syndrome sp pacemaker ([place] dual chamber), sp ablation x3 for her atrial fibrillation (last on 09/20). Plan to repeat ablation.  Continue slow recovery, with improving symptoms and decreasing oxygen requirements.  Pt with confusion and agitation overnight 11/24-25 and in the early morning hours. Delirum causing medications have been stopped. CT head is pending. No obvious metabolic cause for this change. Likely delirium. I have placed an order in support of the patient's husband staying overnight. I have discussed the patient in detail with her husband. All questions answered to the best of my ability.  The patient's mental status has improved, although she continues to hallucinate occasionally. She did fall last night when she got out of bed without asking for assistance. Her oxygen requirements are down to 4L by HFNC from 15 by HFNC on 05/27/2020. Monitor. Continue diuresis.  On 05/29/2020 the patient's heart rate was elevated, but responded to Diltiazem. She is now on oral diltiazem. Heart rate is controlled.   Consultations:  PCCM  Cardiology  Procedures:  None   HOSPITAL COURSE:   Acute hypoxemic  respiratory failure/acute on chronic diastolic heart failure /Pulmonary hypertension Echocardiogram with preserved LV systolic function EF 55 to 60%, elevated PA pressure 63 mmHg systolic. Patient was seen by cardiology.  Patient was placed on diuretics with  good diuresis.  Her oxygen requirements have improved.  She is now saturating normal on room air.  She is ambulated without any difficulty.    Atrial fibrillation with rapid ventricular response Thepatient has converted to2:1 flutter,rate in the 70's.  Seen by cardiology.  Will be discharged on beta-blocker diltiazem and dofetilide.  Continue apixaban.  She has an appointment with cardiology in January to consider ablation.    RSV bronchitis/ viral pneumonia (present on admission) Supportive care was provided.  Due to wheezing she was started on steroids with improvement.  She will be discharged on tapering doses.    Hyperglycemia due to steroids HbA1c noted to be 8.1.  Patient not on any glucose lowering agents at home.  This will need further evaluation in the outpatient setting.  Delirium/Sundowning Patient with hallucinations.  Likely due to medications and hospital stay.  Resolved.   Hyperlipidemia Continue with atorvastatin.  CKD II Creatinine noted to be 1.57 today.  She has good urine output.  Will recommend renal function be checked in the outpatient setting at follow-up..  Depression Cntinue withduloxetine.  Morbid Obesity Class III Estimated body mass index is 41.5 kg/m as calculated from the following:   Height as of this encounter: 5' 6.5" (1.689 m).   Weight as of this encounter: 118.4 kg.   Overall stable.  Patient wants to go home today.  Discussed with her husband who feels like she is back to baseline.  Okay for discharge.  PERTINENT LABS:  The results of significant diagnostics from this hospitalization (including imaging, microbiology, ancillary and laboratory) are listed below for reference.       Labs:    Basic Metabolic Panel: Recent Labs  Lab 05/28/20 0151 05/30/20 0138 05/31/20 1004 06/01/20 0827 06/02/20 0203  NA 138 141 139 138 140  K 4.6 3.2* 3.7 3.6 3.4*  CL 95* 95* 97* 95* 97*  CO2 32 31 29 29 30   GLUCOSE 278* 210* 270* 195* 146*  BUN 24* 29* 26* 27* 32*  CREATININE 1.20* 1.23* 1.25* 1.33* 1.57*  CALCIUM 8.9 8.4* 8.9 9.2 9.2  MG  --  2.2 2.1  --   --    CBC: Recent Labs  Lab 05/27/20 0244 05/28/20 0151  WBC 12.7* 9.6  NEUTROABS 7.7 8.3*  HGB 13.9 14.6  HCT 44.0 45.2  MCV 98.0 96.0  PLT 288 290   BNP: BNP (last 3 results) Recent Labs    05/19/20 1946  BNP 51.8     CBG: Recent Labs  Lab 06/01/20 1139 06/01/20 1724 06/01/20 2047 06/02/20 0559 06/02/20 0744  GLUCAP 160* 265* 514* 113* 128*     IMAGING STUDIES DG Chest 1 View  Result Date: 05/22/2020 CLINICAL DATA:  Shortness of breath and AFib. EXAM: CHEST  1 VIEW COMPARISON:  05/19/2020 FINDINGS: Dual lead left pacemaker unchanged. Patient is slightly rotated to the left as lungs are adequately inflated. There is hazy prominence of the perihilar vasculature compatible with mild vascular congestion. No effusion. Borderline stable cardiomegaly. Remainder of the exam is unchanged. IMPRESSION: Findings suggesting mild vascular congestion. Electronically Signed   By: Marin Olp M.D.   On: 05/22/2020 15:30   DG Chest 2 View  Result Date: 05/19/2020 CLINICAL DATA:  Pt having SOB, cough and congestion since last Friday - hx of AFIB, CHF, pacemaker, CAD, sleep apnea, TIA, MI EXAM: CHEST - 2 VIEW COMPARISON:  05/31/2018 FINDINGS: Cardiac silhouette is normal in size. No mediastinal or hilar masses. No evidence of adenopathy. Stable left  anterior chest wall sequential pacemaker. Mild chronic prominence of the interstitial and vascular markings. Lungs mildly hyperexpanded but otherwise clear. No pleural effusion or pneumothorax. Skeletal structures are intact. IMPRESSION: 1. No acute  cardiopulmonary disease. 2. Chronic lung hyperexpansion with mild prominence of the interstitial and vascular markings, without pulmonary edema. Electronically Signed   By: Lajean Manes M.D.   On: 05/19/2020 11:35   CT HEAD WO CONTRAST  Result Date: 05/26/2020 CLINICAL DATA:  Altered mental status. EXAM: CT HEAD WITHOUT CONTRAST TECHNIQUE: Contiguous axial images were obtained from the base of the skull through the vertex without intravenous contrast. COMPARISON:  April 21, 2010 FINDINGS: Brain: There is mild cerebral atrophy with widening of the extra-axial spaces and ventricular dilatation. There are areas of decreased attenuation within the white matter tracts of the supratentorial brain, consistent with microvascular disease changes. Vascular: No hyperdense vessel or unexpected calcification. Skull: Normal. Negative for fracture or focal lesion. Sinuses/Orbits: There is marked severity bilateral ethmoid sinus mucosal thickening. Other: None. IMPRESSION: 1. Generalized cerebral atrophy. 2. Bilateral ethmoid sinus disease. Electronically Signed   By: Virgina Norfolk M.D.   On: 05/26/2020 20:50   CT Chest Wo Contrast  Result Date: 05/19/2020 CLINICAL DATA:  Chest pain EXAM: CT CHEST WITHOUT CONTRAST TECHNIQUE: Multidetector CT imaging of the chest was performed following the standard protocol without IV contrast. COMPARISON:  05/31/2018 FINDINGS: Cardiovascular: Aortic atherosclerosis and coronary artery calcifications diffusely, most pronounced in the right coronary artery. Pacer wires noted in the right heart. Heart is normal size. Aorta normal caliber. Mediastinum/Nodes: Mediastinal and probable bilateral hilar adenopathy. Index pretracheal lymph node has a short axis diameter of 17 mm. Difficult to measure the hilar lymph nodes due to lack of IV contrast. Lungs/Pleura: Peribronchial thickening noted bilaterally. Areas of atelectasis. Ground-glass airspace opacities in the upper lobes and superior  segment of the lower lobes bilaterally, right greater than left. No effusions. Upper Abdomen: Diffuse fatty infiltration of the liver. Prior cholecystectomy. Musculoskeletal: Chest wall soft tissues are unremarkable. No acute bony abnormality. IMPRESSION: Mediastinal and probable bilateral hilar adenopathy (difficult to visualize without intravenous contrast). This is unchanged since 2019. Peribronchial thickening and areas of atelectasis and ground-glass opacities throughout the upper lobes. Findings could reflect bronchitis. Cannot exclude early bronchopneumonia, particularly in the right upper lobe and superior segment of the right lower lobe. Coronary artery disease. Aortic Atherosclerosis (ICD10-I70.0). Electronically Signed   By: Rolm Baptise M.D.   On: 05/19/2020 17:53   DG CHEST PORT 1 VIEW  Result Date: 05/27/2020 CLINICAL DATA:  Evaluate effusion, cardiomegaly EXAM: PORTABLE CHEST 1 VIEW COMPARISON:  05/23/2019 FINDINGS: Unchanged cardiomegaly. Slightly decreased prominence of the pulmonary vasculature. No focal consolidations, significant pleural effusions, or pneumothorax. Left subclavian improved do we these maker in unchanged position. Atherosclerotic calcification of the aortic arch. The visualized skeletal structures are unremarkable. IMPRESSION: Slight interval improvement in pulmonary vascular congestion. Unchanged cardiomegaly. Electronically Signed   By: Ruthann Cancer MD   On: 05/27/2020 11:21   ECHOCARDIOGRAM COMPLETE BUBBLE STUDY  Result Date: 05/20/2020    ECHOCARDIOGRAM REPORT   Patient Name:   [name] Date of Exam: 05/20/2020 Medical Rec #:  [id number]    Height:       66.5 in Accession #:    [id number]   Weight:       266.8 lb Date of Birth:  [birth year]     BSA:          2.273 m Patient Age:    34  years     BP:           116/54 mmHg Patient Gender: F            HR:           67 bpm. Exam Location:  Inpatient Procedure: 2D Echo, Cardiac Doppler, Color Doppler and Saline Contrast  Bubble            Study Indications:    Q21.1 Patient foramen [place]  History:        Patient has prior history of Echocardiogram examinations, most                 recent 06/01/2018. CHF, CAD and Previous Myocardial Infarction,                 Pacemaker, Stroke, Pulmonary HTN and TIA, Arrythmias:Atrial                 Fibrillation, Signs/Symptoms:Dyspnea; Risk Factors:Hypertension,                 Dyslipidemia and Sleep Apnea. CKD.  Sonographer:    Jonelle Sidle Dance Referring Phys: Bennett  1. Left ventricular ejection fraction, by estimation, is 60 to 65%. The left ventricle has normal function. The left ventricle has no regional wall motion abnormalities. There is mild left ventricular hypertrophy. Left ventricular diastolic parameters are indeterminate.  2. Right ventricular systolic function is normal. The right ventricular size is normal. Tricuspid regurgitation signal is inadequate for assessing PA pressure.  3. The mitral valve is abnormal. No evidence of mitral valve regurgitation. Moderate mitral annular calcification.  4. The aortic valve was not well visualized. Aortic valve regurgitation is not visualized. No aortic stenosis is present.  5. Agitated saline contrast bubble study was negative, with no evidence of any interatrial shunt, though poor quality bubble study FINDINGS  Left Ventricle: Left ventricular ejection fraction, by estimation, is 60 to 65%. The left ventricle has normal function. The left ventricle has no regional wall motion abnormalities. The left ventricular internal cavity size was normal in size. There is  mild left ventricular hypertrophy. Left ventricular diastolic parameters are indeterminate. Right Ventricle: The right ventricular size is normal. Right vetricular wall thickness was not assessed. Right ventricular systolic function is normal. Tricuspid regurgitation signal is inadequate for assessing PA pressure. Left Atrium: Left atrial size was normal in  size. Right Atrium: Right atrial size was normal in size. Pericardium: There is no evidence of pericardial effusion. Mitral Valve: The mitral valve is abnormal. Moderate mitral annular calcification. No evidence of mitral valve regurgitation. Tricuspid Valve: The tricuspid valve is normal in structure. Tricuspid valve regurgitation is trivial. Aortic Valve: The aortic valve was not well visualized. Aortic valve regurgitation is not visualized. No aortic stenosis is present. Pulmonic Valve: The pulmonic valve was not well visualized. Pulmonic valve regurgitation is not visualized. Aorta: The aortic root and ascending aorta are structurally normal, with no evidence of dilitation. IAS/Shunts: The interatrial septum was not well visualized. Agitated saline contrast was given intravenously to evaluate for intracardiac shunting. Agitated saline contrast bubble study was negative, with no evidence of any interatrial shunt.  LEFT VENTRICLE PLAX 2D LVIDd:         4.30 cm LVIDs:         3.00 cm LV PW:         1.00 cm LV IVS:        1.20 cm LVOT diam:     1.70 cm  LV SV:         57 LV SV Index:   25 LVOT Area:     2.27 cm  RIGHT VENTRICLE          IVC RV Basal diam:  2.80 cm  IVC diam: 1.85 cm TAPSE (M-mode): 1.5 cm LEFT ATRIUM             Index       RIGHT ATRIUM           Index LA diam:        3.90 cm 1.72 cm/m  RA Area:     15.30 cm LA Vol (A2C):   87.1 ml 38.32 ml/m RA Volume:   35.60 ml  15.66 ml/m LA Vol (A4C):   47.5 ml 20.90 ml/m LA Biplane Vol: 65.8 ml 28.95 ml/m  AORTIC VALVE LVOT Vmax:   130.50 cm/s LVOT Vmean:  91.100 cm/s LVOT VTI:    0.250 m  AORTA Ao Root diam: 3.30 cm Ao Asc diam:  3.40 cm MITRAL VALVE MV Area (PHT): 2.90 cm     SHUNTS MV Decel Time: 262 msec     Systemic VTI:  0.25 m MV E velocity: 134.50 cm/s  Systemic Diam: 1.70 cm Oswaldo Milian MD Electronically signed by Oswaldo Milian MD Signature Date/Time: 05/20/2020/10:04:29 PM    Final     DISCHARGE EXAMINATION: Vitals:    06/02/20 0500 06/02/20 0514 06/02/20 0814 06/02/20 0815  BP:  118/67    Pulse:      Resp:  20    Temp:  98.3 F (36.8 C)    TempSrc:  Oral    SpO2:  92% 93% 93%  Weight: 118.4 kg     Height:       General appearance: Awake alert.  In no distress Resp: Scattered wheezes.  Good air entry bilaterally.  No use of accessory muscles.  Few crackles at the bases. Cardio: S1-S2 is normal regular.  No S3-S4.  No rubs murmurs or bruit GI: Abdomen is soft.  Nontender nondistended.  Bowel sounds are present normal.  No masses organomegaly Extremities: No edema.  Full range of motion of lower extremities. Neurologic: Alert and oriented x3.  No focal neurological deficits.    DISPOSITION: Home with husband  Discharge Instructions    (HEART FAILURE PATIENTS) Call MD:  Anytime you have any of the following symptoms: 1) 3 pound weight gain in 24 hours or 5 pounds in 1 week 2) shortness of breath, with or without a dry hacking cough 3) swelling in the hands, feet or stomach 4) if you have to sleep on extra pillows at night in order to breathe.   Complete by: As directed    Call MD for:  difficulty breathing, headache or visual disturbances   Complete by: As directed    Call MD for:  extreme fatigue   Complete by: As directed    Call MD for:  persistant dizziness or light-headedness   Complete by: As directed    Call MD for:  persistant nausea and vomiting   Complete by: As directed    Call MD for:  severe uncontrolled pain   Complete by: As directed    Call MD for:  temperature >100.4   Complete by: As directed    Diet - low sodium heart healthy   Complete by: As directed    Discharge instructions   Complete by: As directed    Please take your medications as prescribed.  Follow-up with  your primary care provider in 1 week.  Need to have your renal function and potassium level checked at follow-up.  You were cared for by a hospitalist during your hospital stay. If you have any questions about  your discharge medications or the care you received while you were in the hospital after you are discharged, you can call the unit and asked to speak with the hospitalist on call if the hospitalist that took care of you is not available. Once you are discharged, your primary care physician will handle any further medical issues. Please note that NO REFILLS for any discharge medications will be authorized once you are discharged, as it is imperative that you return to your primary care physician (or establish a relationship with a primary care physician if you do not have one) for your aftercare needs so that they can reassess your need for medications and monitor your lab values. If you do not have a primary care physician, you can call [phone] for a physician referral.   Increase activity slowly   Complete by: As directed         Allergies as of 06/02/2020      Reactions   Adhesive [tape] Rash, Other (See Comments)   Allergic to defibrillation pads.      Medication List    STOP taking these medications   cefdinir 300 MG capsule Commonly known as: OMNICEF     TAKE these medications   atorvastatin 40 MG tablet Commonly known as: LIPITOR TAKE 1 TABLET BY MOUTH EVERY DAY   B-12 5000 MCG Caps Take 5,000 mcg by mouth at bedtime.   benzonatate 100 MG capsule Commonly known as: TESSALON Take 1 capsule (100 mg total) by mouth 3 (three) times daily as needed for cough.   CALCIUM 600+D PO Take 1 tablet by mouth daily.   diltiazem 360 MG 24 hr tablet Commonly known as: CARDIZEM LA Take 1 tablet (360 mg total) by mouth daily.   dofetilide 250 MCG capsule Commonly known as: TIKOSYN Take 1 capsule (250 mcg total) by mouth 2 (two) times daily.   DULoxetine 60 MG capsule Commonly known as: CYMBALTA TAKE 1 CAPSULE BY MOUTH EVERY DAY What changed: how much to take   Eliquis 5 MG Tabs tablet Generic drug: apixaban TAKE 1 TABLET BY MOUTH TWICE A DAY What changed: how much to take    fluticasone 50 MCG/ACT nasal spray Commonly known as: FLONASE Place 1 spray into both nostrils at bedtime as needed for allergies.   loratadine 10 MG tablet Commonly known as: CLARITIN TAKE 1 TABLET BY MOUTH EVERY DAY   Lysine 1000 MG Tabs Take 1,000 mg by mouth at bedtime.   metolazone 2.5 MG tablet Commonly known as: ZAROXOLYN Take 1 tablet (2.5 mg total) by mouth every 4 days AS NEEDED for swelling What changed:   how much to take  how to take this  when to take this   metoprolol tartrate 50 MG tablet Commonly known as: LOPRESSOR Take 1 tablet (50 mg total) by mouth 2 (two) times daily.   multivitamin with minerals Tabs tablet Take 1 tablet by mouth daily. Centrum   nitroGLYCERIN 0.4 MG SL tablet Commonly known as: NITROSTAT PLACE 1 TABLET (0.4 MG TOTAL) UNDER THE TONGUE EVERY 5 (FIVE) MINUTES X 3 DOSES AS NEEDED FOR CHEST PAIN.   nystatin-triamcinolone cream Commonly known as: MYCOLOG II Apply 1 application topically 2 (two) times daily as needed. What changed: reasons to take this   omeprazole  20 MG capsule Commonly known as: PRILOSEC Take 20 mg by mouth daily before breakfast.   potassium chloride SA 20 MEQ tablet Commonly known as: Klor-Con M20 Take 1 tablet (20 mEq total) by mouth 2 (two) times daily. Please make yearly appt with Dr. Acie Fredrickson for January 2022 for future refills. Thank you 1st attempt   predniSONE 20 MG tablet Commonly known as: DELTASONE Take 3 tablets once daily for 3 days followed by 2 tablets once daily for 3 days followed by 1 tablet once daily for 3 days and then stop   Systane 0.4-0.3 % Soln Generic drug: Polyethyl Glycol-Propyl Glycol Place 1 drop into both eyes 2 (two) times daily as needed (dry/irritated eyes.).   torsemide 20 MG tablet Commonly known as: DEMADEX Take 2 tablets (40 mg total) by mouth 2 (two) times daily.   valACYclovir 1000 MG tablet Commonly known as: VALTREX Take 2 tablets (2,000 mg total) by mouth every  12 (twelve) hours as needed (fever blisters/cold sores.).   vitamin C 1000 MG tablet Take 1,000 mg by mouth at bedtime.   Vitamin D3 125 MCG (5000 UT) Tabs Take 5,000 Units by mouth at bedtime.   vitamin E 1000 UNIT capsule Take 1,000 Units by mouth at bedtime.         Follow-up Information    [name], [name], MD. Schedule an appointment as soon as possible for a visit in 1 week(s).   Specialties: Internal Medicine, Pediatrics Contact information: [place] [address] [phone]               TOTAL DISCHARGE TIME: 33 minutes  [place]  [facility] Pager on [email or web address]  06/02/2020, 1:43 PM

## 2020-06-02 NOTE — Telephone Encounter (Signed)
Transition Care Management Follow-up Telephone Call  Date of discharge and from where: 06/02/2020 from [facility]   How have you been since you were released from the hospital? Patient states she is doing well   Any questions or concerns? No  Items Reviewed:  Did the pt receive and understand the discharge instructions provided? Yes   Medications obtained and verified? Yes   Other? No   Any new allergies since your discharge? No   Dietary orders reviewed? Yes  Do you have support at home? Yes   Home Care and Equipment/Supplies: Were home health services ordered? not applicable If so, what is the name of the agency? N/A   Has the agency set up a time to come to the patient's home? no Were any new equipment or medical supplies ordered?  No What is the name of the medical supply agency? No  Were you able to get the supplies/equipment? no Do you have any questions related to the use of the equipment or supplies? No  Functional Questionnaire: (I = Independent and D = Dependent) ADLs: I  Bathing/Dressing- I  Meal Prep- I  Eating- I  Maintaining continence- I  Transferring/Ambulation- I  Managing Meds- I  Follow up appointments reviewed:   PCP Hospital f/u appt confirmed? Yes  Scheduled to see Dr. [name]  on 06/07/2020 @ 9:30 am.  [facility] f/u appt confirmed? Yes  Scheduled to see Dr.Ramaswamy  on 07/21/2020 @ 10:30 am.  Are transportation arrangements needed? No   If their condition worsens, is the pt aware to call PCP or go to the Emergency Dept.? Yes  Was the patient provided with contact information for the PCP's office or ED? Yes  Was to pt encouraged to call back with questions or concerns? Yes

## 2020-06-02 NOTE — Progress Notes (Signed)
Physical Therapy Treatment Patient Details Name: [name] MRN: [id number] DOB: 1945-08-08 Today's Date: 06/02/2020    History of Present Illness 74 y.o. female with medical history significant for morbid obesity, congestive heart failure, coronary artery disease status post stent angioplasty, history of A. fib status post ablation on chronic anticoagulation therapy with Eliquis, pulmonary hypertension status post pacemaker insertion who presents to the ER on 11/17 for worsening shortness of breath. Workup for acute on chronic diastolic heart failure, RSV.    PT Comments    Pt much improved from respiratory status. Pt SpO2 >91% on RA t/o ambulation duration. Pt amb 500' without AD, not SOB, report 2/4 DOE. Pt instructed to use rollator at home to walk longer distances to build her activity tolerance. Using rollator will aide in energy conservation and provide a seat for pt to sit and rest as pt builds her activity and ambulation tolerance. Acute PT to cont to follow.    Follow Up Recommendations  No PT follow up;Supervision for mobility/OOB     Equipment Recommendations  None recommended by PT    Recommendations for Other Services       Precautions / Restrictions Precautions Precautions: Fall Precaution Comments: monitor O2 Restrictions Weight Bearing Restrictions: No    Mobility  Bed Mobility               General bed mobility comments: pt received coming out of the bathroom  Transfers Overall transfer level: Modified independent Equipment used: None Transfers: Sit to/from Stand Sit to Stand: Modified independent (Device/Increase time)         General transfer comment: pt pushed up from arm rests and reached back, no physical assist, takes self to bathroom  Ambulation/Gait Ambulation/Gait assistance: Supervision Gait Distance (Feet): 500 Feet Assistive device: None Gait Pattern/deviations: Step-through pattern Gait velocity: dec Gait velocity  interpretation: 1.31 - 2.62 ft/sec, indicative of limited community ambulator General Gait Details: supervision for safety, SpO2 >91% on RA, pt reports 2/4 DOE with walking, noted SOB   Stairs             Wheelchair Mobility    Modified Rankin (Stroke Patients Only)       Balance Overall balance assessment: No apparent balance deficits (not formally assessed)                                          Cognition Arousal/Alertness: Awake/alert Behavior During Therapy: WFL for tasks assessed/performed Overall Cognitive Status: Within Functional Limits for tasks assessed                                        Exercises      General Comments General comments (skin integrity, edema, etc.): SpO2 >91% on RA      Pertinent Vitals/Pain Pain Assessment: No/denies pain    Home Living                      Prior Function            PT Goals (current goals can now be found in the care plan section) Progress towards PT goals: Progressing toward goals    Frequency    Min 3X/week      PT Plan Current plan remains appropriate    Co-evaluation  AM-PAC PT "6 Clicks" Mobility   Outcome Measure  Help needed turning from your back to your side while in a flat bed without using bedrails?: None Help needed moving from lying on your back to sitting on the side of a flat bed without using bedrails?: None Help needed moving to and from a bed to a chair (including a wheelchair)?: None Help needed standing up from a chair using your arms (e.g., wheelchair or bedside chair)?: None Help needed to walk in hospital room?: None Help needed climbing 3-5 steps with a railing? : A Little 6 Click Score: 23    End of Session Equipment Utilized During Treatment: Oxygen Activity Tolerance: Patient tolerated treatment well Patient left: in chair;with call bell/phone within reach Nurse Communication: Mobility status PT Visit  Diagnosis: Other abnormalities of gait and mobility (R26.89)     Time: 5844-1712 PT Time Calculation (min) (ACUTE ONLY): 11 min  Charges:  $Gait Training: 8-22 mins                     Kittie Plater, PT, DPT Acute Rehabilitation Services Pager #: [phone] Office #: [phone]    Berline Lopes 06/02/2020, 10:46 AM

## 2020-06-04 NOTE — Telephone Encounter (Signed)
I am out of office for the next 2 weeks  So  She will either have to see another provider or    Delay a post hospital visit till I am back in office   FU with pulmonary  And cardiology is the most important . As planned  Glad she is back to baseline.

## 2020-06-07 ENCOUNTER — Ambulatory Visit (INDEPENDENT_AMBULATORY_CARE_PROVIDER_SITE_OTHER): Payer: PPO | Admitting: Family Medicine

## 2020-06-07 ENCOUNTER — Inpatient Hospital Stay: Payer: PPO | Admitting: Internal Medicine

## 2020-06-07 ENCOUNTER — Other Ambulatory Visit: Payer: Self-pay

## 2020-06-07 ENCOUNTER — Encounter: Payer: Self-pay | Admitting: Family Medicine

## 2020-06-07 VITALS — BP 118/68 | HR 72 | Temp 98.0°F | Ht 66.5 in | Wt 265.0 lb

## 2020-06-07 DIAGNOSIS — B370 Candidal stomatitis: Secondary | ICD-10-CM | POA: Diagnosis not present

## 2020-06-07 DIAGNOSIS — E119 Type 2 diabetes mellitus without complications: Secondary | ICD-10-CM | POA: Diagnosis not present

## 2020-06-07 DIAGNOSIS — J9601 Acute respiratory failure with hypoxia: Secondary | ICD-10-CM | POA: Diagnosis not present

## 2020-06-07 DIAGNOSIS — I5033 Acute on chronic diastolic (congestive) heart failure: Secondary | ICD-10-CM

## 2020-06-07 DIAGNOSIS — I480 Paroxysmal atrial fibrillation: Secondary | ICD-10-CM

## 2020-06-07 DIAGNOSIS — I272 Pulmonary hypertension, unspecified: Secondary | ICD-10-CM | POA: Diagnosis not present

## 2020-06-07 DIAGNOSIS — I10 Essential (primary) hypertension: Secondary | ICD-10-CM | POA: Diagnosis not present

## 2020-06-07 DIAGNOSIS — J205 Acute bronchitis due to respiratory syncytial virus: Secondary | ICD-10-CM | POA: Diagnosis not present

## 2020-06-07 MED ORDER — NYSTATIN 100000 UNIT/ML MT SUSP: 5.0000 mL | Freq: Four times a day (QID) | OROMUCOSAL | 0 refills | Status: DC

## 2020-06-07 NOTE — Telephone Encounter (Signed)
In office appointment scheduled for 06-07-2020

## 2020-06-07 NOTE — Progress Notes (Signed)
   Subjective:    Patient ID: [name], female    DOB: [birth year], 74 y.o.   MRN: [id number]  HPI Here to follow up on a hospital stay from 05-19-20 to 06-02-20 for an RSV bronchitis, atrial fibrillation with a rapid ventricular response, and acute on chronic diastolic heart failure. She had a dry cough for a few days before her admission, but she quickly became weaker and very SOB. On admission a chest CT showed bilateral ground glass opacities consistent with a viral bronchitis, and she tested positive for RSV and negative for Covid-19. She was placed on 4 liters of oxygen, and she was given IV antibiotics, Lasix, and steroids. Her RVR was brought down with IV diltiazem. She gradually improved, and she was able to come off oxygen. On DC her creatinine was 1.57 with a GFR of 34. She is off antibiotics, and she is finishing up the last few days of Prednisone. Her A1c on admission was 8.1 (up from 6.8 in July). She admits to not watching her diet and not exercising. She has had 3 cardiac ablations for the atrial fib, and she will meet with Dr. Curt Bears in January to consider a fourth one. Today she feels tired and she has soreness in the mouth and throat. No coughing or SOB.    Review of Systems  Constitutional: Positive for fatigue.  Respiratory: Negative.   Cardiovascular: Negative.   Neurological: Negative.        Objective:   Physical Exam Constitutional:      Appearance: She is obese. She is not ill-appearing.  HENT:     Mouth/Throat:     Comments: The entire OP is red without exudate  Cardiovascular:     Rate and Rhythm: Normal rate and regular rhythm.     Pulses: Normal pulses.     Heart sounds: Normal heart sounds.  Pulmonary:     Effort: Pulmonary effort is normal.     Breath sounds: Normal breath sounds.  Musculoskeletal:     Right lower leg: Edema present.     Left lower leg: Edema present.  Neurological:     Mental Status: She is alert.           Assessment &  Plan:  She is recovering from an RSV bronchitis which led to some CHF and atrial fib with RVR. She will finish out the steroids. She is in sinus rhythm today. She will see Cardiology next month. She will see Dr. Onnie Graham next month. She has some CKD, so we will check another BMET today. Her diabetes is not controlled. We considered starting her on Metformin, but we did not because of her renal function. She will follow up with Dr. [name] next month, but she did commit to changing her diet and losing some weight. She has thrush so we will treat this with Nystatin oral suspension.  Alysia Penna, MD

## 2020-06-08 LAB — BASIC METABOLIC PANEL WITH GFR
BUN/Creatinine Ratio: 31 (calc) — ABNORMAL HIGH (ref 6–22)
BUN: 47 mg/dL — ABNORMAL HIGH (ref 7–25)
CO2: 28 mmol/L (ref 20–32)
Calcium: 9.2 mg/dL (ref 8.6–10.4)
Chloride: 90 mmol/L — ABNORMAL LOW (ref 98–110)
Creat: 1.52 mg/dL — ABNORMAL HIGH (ref 0.60–0.93)
GFR, Est African American: 39 mL/min/{1.73_m2} — ABNORMAL LOW (ref >=60)
GFR, Est Non African American: 33 mL/min/{1.73_m2} — ABNORMAL LOW (ref >=60)
Glucose, Bld: 422 mg/dL — ABNORMAL HIGH (ref 65–99)
Potassium: 5 mmol/L (ref 3.5–5.3)
Sodium: 133 mmol/L — ABNORMAL LOW (ref 135–146)

## 2020-06-21 ENCOUNTER — Other Ambulatory Visit: Payer: PPO | Admitting: *Deleted

## 2020-06-21 ENCOUNTER — Other Ambulatory Visit: Payer: Self-pay

## 2020-06-21 DIAGNOSIS — Z01812 Encounter for preprocedural laboratory examination: Secondary | ICD-10-CM

## 2020-06-21 DIAGNOSIS — I4819 Other persistent atrial fibrillation: Secondary | ICD-10-CM | POA: Diagnosis not present

## 2020-06-21 LAB — CBC
Hematocrit: 46.5 % (ref 34.0–46.6)
Hemoglobin: 16.1 g/dL — ABNORMAL HIGH (ref 11.1–15.9)
MCH: 32.7 pg (ref 26.6–33.0)
MCHC: 34.6 g/dL (ref 31.5–35.7)
MCV: 94 fL (ref 79–97)
Platelets: 202 10*3/uL (ref 150–450)
RBC: 4.93 x10E6/uL (ref 3.77–5.28)
RDW: 12.5 % (ref 11.7–15.4)
WBC: 9.6 10*3/uL (ref 3.4–10.8)

## 2020-06-21 LAB — BASIC METABOLIC PANEL
BUN/Creatinine Ratio: 13 (ref 12–28)
BUN: 24 mg/dL (ref 8–27)
CO2: 27 mmol/L (ref 20–29)
Calcium: 9.9 mg/dL (ref 8.7–10.3)
Chloride: 86 mmol/L — ABNORMAL LOW (ref 96–106)
Creatinine, Ser: 1.9 mg/dL — ABNORMAL HIGH (ref 0.57–1.00)
GFR calc Af Amer: 30 mL/min/{1.73_m2} — ABNORMAL LOW (ref >59)
GFR calc non Af Amer: 26 mL/min/{1.73_m2} — ABNORMAL LOW (ref >59)
Glucose: 365 mg/dL — ABNORMAL HIGH (ref 65–99)
Potassium: 3.4 mmol/L — ABNORMAL LOW (ref 3.5–5.2)
Sodium: 137 mmol/L (ref 134–144)

## 2020-06-24 ENCOUNTER — Encounter: Payer: Self-pay | Admitting: Internal Medicine

## 2020-06-24 ENCOUNTER — Other Ambulatory Visit: Payer: Self-pay | Admitting: Cardiology

## 2020-06-24 MED ORDER — DILTIAZEM HCL ER COATED BEADS 360 MG PO TB24
360.0000 mg | ORAL_TABLET | Freq: Every day | ORAL | 3 refills | Status: DC
Start: 2020-06-24 — End: 2020-11-23

## 2020-06-26 ENCOUNTER — Emergency Department (HOSPITAL_COMMUNITY): Payer: PPO

## 2020-06-26 ENCOUNTER — Emergency Department (HOSPITAL_COMMUNITY)
Admission: EM | Admit: 2020-06-26 | Discharge: 2020-06-26 | Disposition: A | Discharge status: Home / Self Care | Payer: PPO | Attending: Emergency Medicine | Admitting: Emergency Medicine

## 2020-06-26 ENCOUNTER — Other Ambulatory Visit: Payer: Self-pay

## 2020-06-26 ENCOUNTER — Encounter (HOSPITAL_COMMUNITY): Payer: Self-pay

## 2020-06-26 DIAGNOSIS — Z79899 Other long term (current) drug therapy: Secondary | ICD-10-CM | POA: Diagnosis not present

## 2020-06-26 DIAGNOSIS — R739 Hyperglycemia, unspecified: Secondary | ICD-10-CM

## 2020-06-26 DIAGNOSIS — Z96652 Presence of left artificial knee joint: Secondary | ICD-10-CM | POA: Insufficient documentation

## 2020-06-26 DIAGNOSIS — E1165 Type 2 diabetes mellitus with hyperglycemia: Secondary | ICD-10-CM | POA: Insufficient documentation

## 2020-06-26 DIAGNOSIS — I2510 Atherosclerotic heart disease of native coronary artery without angina pectoris: Secondary | ICD-10-CM | POA: Diagnosis not present

## 2020-06-26 DIAGNOSIS — Z85828 Personal history of other malignant neoplasm of skin: Secondary | ICD-10-CM | POA: Diagnosis not present

## 2020-06-26 DIAGNOSIS — I5033 Acute on chronic diastolic (congestive) heart failure: Secondary | ICD-10-CM | POA: Insufficient documentation

## 2020-06-26 DIAGNOSIS — R42 Dizziness and giddiness: Secondary | ICD-10-CM | POA: Diagnosis not present

## 2020-06-26 DIAGNOSIS — E119 Type 2 diabetes mellitus without complications: Secondary | ICD-10-CM

## 2020-06-26 DIAGNOSIS — I10 Essential (primary) hypertension: Secondary | ICD-10-CM | POA: Diagnosis not present

## 2020-06-26 DIAGNOSIS — E1169 Type 2 diabetes mellitus with other specified complication: Secondary | ICD-10-CM | POA: Diagnosis not present

## 2020-06-26 DIAGNOSIS — Z950 Presence of cardiac pacemaker: Secondary | ICD-10-CM | POA: Diagnosis not present

## 2020-06-26 DIAGNOSIS — I4891 Unspecified atrial fibrillation: Secondary | ICD-10-CM | POA: Insufficient documentation

## 2020-06-26 DIAGNOSIS — Z7984 Long term (current) use of oral hypoglycemic drugs: Secondary | ICD-10-CM | POA: Diagnosis not present

## 2020-06-26 DIAGNOSIS — Z87891 Personal history of nicotine dependence: Secondary | ICD-10-CM | POA: Insufficient documentation

## 2020-06-26 DIAGNOSIS — I110 Hypertensive heart disease with heart failure: Secondary | ICD-10-CM | POA: Insufficient documentation

## 2020-06-26 DIAGNOSIS — I272 Pulmonary hypertension, unspecified: Secondary | ICD-10-CM | POA: Diagnosis not present

## 2020-06-26 DIAGNOSIS — E785 Hyperlipidemia, unspecified: Secondary | ICD-10-CM | POA: Insufficient documentation

## 2020-06-26 LAB — CBC
HCT: 44.3 % (ref 36.0–46.0)
Hemoglobin: 14.5 g/dL (ref 12.0–15.0)
MCH: 31.5 pg (ref 26.0–34.0)
MCHC: 32.7 g/dL (ref 30.0–36.0)
MCV: 96.1 fL (ref 80.0–100.0)
Platelets: 197 10*3/uL (ref 150–400)
RBC: 4.61 MIL/uL (ref 3.87–5.11)
RDW: 14 % (ref 11.5–15.5)
WBC: 6.3 10*3/uL (ref 4.0–10.5)
nRBC: 0 % (ref 0.0–0.2)

## 2020-06-26 LAB — URINALYSIS, ROUTINE W REFLEX MICROSCOPIC
Bilirubin Urine: NEGATIVE
Glucose, UA: 150 mg/dL — AB
Hgb urine dipstick: NEGATIVE
Ketones, ur: NEGATIVE mg/dL
Leukocytes,Ua: NEGATIVE
Nitrite: NEGATIVE
Protein, ur: NEGATIVE mg/dL
Specific Gravity, Urine: 1.006 (ref 1.005–1.030)
pH: 7 (ref 5.0–8.0)

## 2020-06-26 LAB — CBG MONITORING, ED
Glucose-Capillary: 393 mg/dL — ABNORMAL HIGH (ref 70–99)
Glucose-Capillary: 263 mg/dL — ABNORMAL HIGH (ref 70–99)

## 2020-06-26 LAB — BASIC METABOLIC PANEL
Anion gap: 14 (ref 5–15)
BUN: 27 mg/dL — ABNORMAL HIGH (ref 8–23)
CO2: 27 mmol/L (ref 22–32)
Calcium: 8.7 mg/dL — ABNORMAL LOW (ref 8.9–10.3)
Chloride: 92 mmol/L — ABNORMAL LOW (ref 98–111)
Creatinine, Ser: 1.36 mg/dL — ABNORMAL HIGH (ref 0.44–1.00)
GFR, Estimated: 41 mL/min — ABNORMAL LOW (ref >60)
Glucose, Bld: 370 mg/dL — ABNORMAL HIGH (ref 70–99)
Potassium: 3.4 mmol/L — ABNORMAL LOW (ref 3.5–5.1)
Sodium: 133 mmol/L — ABNORMAL LOW (ref 135–145)

## 2020-06-26 MED ORDER — METFORMIN HCL 500 MG PO TABS: 500.0000 mg | ORAL_TABLET | Freq: Two times a day (BID) | ORAL | 1 refills | Status: DC

## 2020-06-26 MED ORDER — SODIUM CHLORIDE 0.9 % IV BOLUS
500.0000 mL | Freq: Once | INTRAVENOUS | Status: AC
  Administered 2020-06-26: 12:00:00 500 mL via INTRAVENOUS

## 2020-06-26 MED ORDER — SODIUM CHLORIDE 0.9 % IV SOLN: INTRAVENOUS | Status: DC

## 2020-06-26 NOTE — ED Triage Notes (Signed)
Pt reports hyperglycemia for the past 3 days, pt is not diabetic but she was recently admitted for RSV and since discharge she checks her blood sugar periodically. Pt also reports for the past few days she has felt dizzy/lightheaded. Pt is not on any medications for diabetes currently. Pt a.o, nad noted. CBG 393 in triage

## 2020-06-26 NOTE — ED Notes (Signed)
Patient transported to CT 

## 2020-06-26 NOTE — ED Provider Notes (Signed)
[facility] EMERGENCY DEPARTMENT Provider Note   CSN: [id number] Arrival date & time: 06/26/20  8527     History Chief Complaint  Patient presents with  . Hyperglycemia  . Dizziness    [name] is a 74 y.o. female.  Patient in for feeling increased thirst frequent urination blood sugars have been high all month.  Seen her primary care doctor and blood sugars have been running in the upper 300 400 range.  I have opted not to start her on any oral hypoglycemic medicine.  Patient's past medical history is also significant for congestive heart failure she is on Eliquis history of atrial fib.  And has some chronic kidney disease.  Patient is followed by [facility] cardiology.  Dr. Curt Bears.  Patient without any chest pain or shortness of breath.  But she has been feeling lightheaded dizzy no room spinning no true vertigo.  Patient does have a pacemaker so cannot have MRI.        Past Medical History:  Diagnosis Date  . Anticoagulant long-term use    pradaxa  . Anxiety   . Arthritis    "fingers, lower back" (04/23/2017   . CAD (coronary artery disease) 7824,2353   post PTCA with bare-metal stenting to mid RCA in December 2004     . CHF (congestive heart failure) ([place])   . Chronic atrial fibrillation ([place]) 06/2007   Tachybradycardia pacemaker  . Chronic kidney disease    10% function - ?R, other kidney is compensating    . CVA (cerebral vascular accident) [facility]) [phone]   denies residual on 04/23/2017  . Depression   . Diplopia 06/19/2008   Qualifier: Diagnosis of  By: [name] MD, [name]   . Dysrhythmia    ATRIAL FIBRILATION  . Edema of lower extremity   . Hyperlipidemia   . Hypertension   . Inferior myocardial infarction [facility])    acute inferior wall mi/other medical hx  . Myocardial infarction ([place]) [id number]  . Obesity   . OSA on CPAP    last test- 2010  . Pacemaker   . Pneumonia 2014   tx. ----  [facility]  . Pulmonary hypertension ([place])     moderate pulmonary hypertension by 10/2016 echo and 10/2013 cardiac cath  . Shortness of breath   . Skin cancer    "cut off right Scaff; burned off LLE" (04/23/2017)  . Sleep apnea   . Spondylolisthesis   . TIA (transient ischemic attack) 2008  . Unspecified hemorrhoids without mention of complication 0/02/6760   Colonoscopy--Dr. Carlean Purl     Patient Active Problem List   Diagnosis Date Noted  . Type 2 diabetes mellitus with hyperlipidemia ([place]) 05/22/2020  . Acute respiratory failure ([place]) 05/19/2020  . Hypokalemia 05/19/2020  . Secondary hypercoagulable state ([place]) 02/16/2020  . CHF exacerbation ([place]) 05/31/2018  . Bronchitis 05/31/2018  . Primary osteoarthritis of left knee 04/23/2017  . Degenerative arthritis of left knee 04/19/2017  . Atypical atrial flutter ([place]) 09/30/2016  . Hematoma 09/30/2016  . Hypotension 09/30/2016  . Persistent atrial fibrillation ([place]) 09/29/2016  . Hyperlipidemia   . HLD (hyperlipidemia)   . Controlled type 2 diabetes mellitus without complication ([place])   . Acute respiratory failure with hypoxia ([place]) 03/26/2016  . DDD (degenerative disc disease), lumbar 04/05/2015  . Degenerative disc disease, lumbar 04/05/2015  . Severe obesity (BMI >= 40) ([place]) 02/16/2015  . Fatty liver disease, nonalcoholic 95/03/3266  . Cough, persistent 04/03/2014  . Acute on chronic diastolic congestive  heart failure ([place]) 10/07/2013  . Pre-diabetes 09/24/2013  . Peripheral edema 08/05/2013  . Neoplasm of uncertain behavior of skin of back 11/09/2012  . Edema 11/04/2012  . Bleeding mole 11/04/2012  . Pulmonary hypertension ([place]) 06/11/2012  . Pneumonia 06/05/2012  . Hypoxia 06/05/2012  . Anemia 06/05/2012  . Hemorrhoids 05/11/2012  . Dyspepsia 05/11/2012  . Medication management 04/20/2011  . Positional vertigo 10/07/2010  . Anticoagulant long-term use   . HERPES LABIALIS 12/30/2009  . INTERTRIGO 12/30/2009  . Obesity 06/02/2009  . Obstructive sleep apnea  10/12/2008  . HYPERLIPIDEMIA 09/15/2008  . SNORING 09/15/2008  . Essential hypertension 06/19/2008  . CAD S/P percutaneous coronary angioplasty 06/19/2008  . Paroxysmal atrial fibrillation ([place]) 06/19/2008  . BRADYCARDIA-TACHYCARDIA SYNDROME 06/19/2008  . CEREBRAL ANEURYSM 06/19/2008  . Allergic rhinitis 06/19/2008  . HYPERGLYCEMIA 06/19/2008  . DEPRESSION, HX OF 06/19/2008  . CEREBROVASCULAR ACCIDENT, HX OF 06/19/2008  . PACEMAKER, PERMANENT 06/19/2008    Past Surgical History:  Procedure Laterality Date  . ABDOMINAL HYSTERECTOMY    . APPENDECTOMY  1984  . ATRIAL FIBRILLATION ABLATION N/A 09/29/2016   Procedure: Atrial Fibrillation Ablation;  Surgeon: Will Meredith Leeds, MD;  Location: [place] CV LAB;  Service: Cardiovascular;  Laterality: N/A;  . ATRIAL FIBRILLATION ABLATION N/A 02/07/2018   Procedure: ATRIAL FIBRILLATION ABLATION;  Surgeon: Constance Haw, MD;  Location: [place] CV LAB;  Service: Cardiovascular;  Laterality: N/A;  . ATRIAL FIBRILLATION ABLATION N/A 03/13/2019   Procedure: ATRIAL FIBRILLATION ABLATION;  Surgeon: Constance Haw, MD;  Location: [place] CV LAB;  Service: Cardiovascular;  Laterality: N/A;  . BACK SURGERY    . CARDIOVERSION N/A 09/12/2017   Procedure: CARDIOVERSION;  Surgeon: Jerline Pain, MD;  Location: [facility] ENDOSCOPY;  Service: Cardiovascular;  Laterality: N/A;  . CARDIOVERSION N/A 12/13/2017   Procedure: CARDIOVERSION;  Surgeon: Sanda Klein, MD;  Location: [place] ENDOSCOPY;  Service: Cardiovascular;  Laterality: N/A;  . CARDIOVERSION N/A 02/18/2018   Procedure: CARDIOVERSION;  Surgeon: Dorothy Spark, MD;  Location: [facility] ENDOSCOPY;  Service: Cardiovascular;  Laterality: N/A;  . CARDIOVERSION N/A 05/20/2018   Procedure: CARDIOVERSION;  Surgeon: [name], MD;  Location: [facility] ENDOSCOPY;  Service: Cardiovascular;  Laterality: N/A;  . CARDIOVERSION N/A 04/16/2019   Procedure: CARDIOVERSION;  Surgeon: Donato Heinz, MD;   Location: [place];  Service: Endoscopy;  Laterality: N/A;  . CARDIOVERSION N/A 01/22/2020   Procedure: CARDIOVERSION;  Surgeon: Geralynn Rile, MD;  Location: [place];  Service: Cardiovascular;  Laterality: N/A;  . CARDIOVERSION N/A 02/18/2020   Procedure: CARDIOVERSION;  Surgeon: Elouise Munroe, MD;  Location: [facility] ENDOSCOPY;  Service: Cardiovascular;  Laterality: N/A;  . CATARACT EXTRACTION W/ INTRAOCULAR LENS  IMPLANT, BILATERAL Bilateral 01/15/2017- 03/2017  . CHOLECYSTECTOMY    . CORONARY ANGIOPLASTY  X 2  . CORONARY ANGIOPLASTY WITH STENT PLACEMENT  1998; ~ 2007; ?date   "1 stent; replaced stent; not sure when I got the last stent" (04/23/2017)  . DOPPLER ECHOCARDIOGRAPHY  2009  . ELECTROPHYSIOLOGIC STUDY N/A 03/31/2016   Procedure: Cardioversion;  Surgeon: Evans Lance, MD;  Location: [place] CV LAB;  Service: Cardiovascular;  Laterality: N/A;  . ELECTROPHYSIOLOGIC STUDY N/A 08/04/2016   Procedure: Cardioversion;  Surgeon: Evans Lance, MD;  Location: [place] CV LAB;  Service: Cardiovascular;  Laterality: N/A;  . INSERT / REPLACE / REMOVE PACEMAKER  06/2007  . IR RADIOLOGY PERIPHERAL GUIDED IV START  01/31/2018  . IR US GUIDE VASC ACCESS LEFT  01/31/2018  .  JOINT REPLACEMENT    . LAPAROSCOPIC CHOLECYSTECTOMY  1994  . LEFT AND RIGHT HEART CATHETERIZATION WITH CORONARY ANGIOGRAM N/A 10/06/2013   Procedure: LEFT AND RIGHT HEART CATHETERIZATION WITH CORONARY ANGIOGRAM;  Surgeon: Troy Sine, MD;  Location: [facility] CATH LAB;  Service: Cardiovascular;  Laterality: N/A;  . LEFT OOPHORECTOMY Left ~ 1989  . POSTERIOR LUMBAR FUSION  2000s - 04/2015 X 3   L3-4; L4-5; L2-3; Dr Trenton Gammon  . RIGHT/LEFT HEART CATH AND CORONARY ANGIOGRAPHY N/A 05/09/2017   Procedure: RIGHT/LEFT HEART CATH AND CORONARY ANGIOGRAPHY;  Surgeon: Sherren Mocha, MD;  Location: [place] CV LAB;  Service: Cardiovascular;  Laterality: N/A;  . SKIN CANCER EXCISION Right    [place]  . TEE WITHOUT  CARDIOVERSION N/A 09/29/2016   Procedure: TRANSESOPHAGEAL ECHOCARDIOGRAM (TEE);  Surgeon: Jerline Pain, MD;  Location: [place];  Service: Cardiovascular;  Laterality: N/A;  . TOTAL ABDOMINAL HYSTERECTOMY  1984   "uterus & right ovary"  . TOTAL KNEE ARTHROPLASTY Left 04/23/2017  . TOTAL KNEE ARTHROPLASTY Left 04/23/2017   Procedure: TOTAL KNEE ARTHROPLASTY;  Surgeon: Frederik Pear, MD;  Location: [place];  Service: Orthopedics;  Laterality: Left;  . ULTRASOUND GUIDANCE FOR VASCULAR ACCESS  05/09/2017   Procedure: Ultrasound Guidance For Vascular Access;  Surgeon: Sherren Mocha, MD;  Location: [place] CV LAB;  Service: Cardiovascular;;     OB History   No obstetric history on file.     Family History  Problem Relation Age of Onset  . Suicidality Father        suicide death pt was 3 yrs, 82  . Arrhythmia Mother   . Hypertension Mother   . Diabetes Mother   . Dementia Mother   . Heart attack Brother   . Heart disease Paternal Aunt   . Prostate cancer Maternal Grandfather   . Diabetes Paternal Grandfather        fathers side of the family  . Colon cancer Maternal Aunt     Social History   Tobacco Use  . Smoking status: Former Smoker    Packs/day: 1.00    Years: 5.00    Pack years: 5.00    Types: Cigarettes    Start date: 05/11/1978    Quit date: 07/03/1982    Years since quitting: 38.0  . Smokeless tobacco: Never Used  Vaping Use  . Vaping Use: Never used  Substance Use Topics  . Alcohol use: Yes    Comment: 04/23/2017 "once q 6 months; glass of wine"  . Drug use: No    Home Medications Prior to Admission medications   Medication Sig Start Date End Date Taking? Authorizing Provider  Ascorbic Acid (VITAMIN C) 1000 MG tablet Take 1,000 mg by mouth at bedtime.    Yes [provider]  atorvastatin (LIPITOR) 40 MG tablet TAKE 1 TABLET BY MOUTH EVERY DAY Patient taking differently: Take 40 mg by mouth daily. 09/16/19  Yes Camnitz, Ocie Doyne, MD  Calcium  Carbonate-Vitamin D (CALCIUM 600+D PO) Take 1 tablet by mouth daily.    Yes [provider]  Cholecalciferol (VITAMIN D3) 125 MCG (5000 UT) TABS Take 5,000 Units by mouth at bedtime.    Yes [provider]  Cyanocobalamin (B-12) 5000 MCG CAPS Take 5,000 mcg by mouth at bedtime.    Yes [provider]  diltiazem (CARDIZEM LA) 360 MG 24 hr tablet Take 1 tablet (360 mg total) by mouth daily. 06/24/20  Yes Camnitz, Will Hassell Done, MD  dofetilide (TIKOSYN) 250 MCG capsule Take  1 capsule (250 mcg total) by mouth 2 (two) times daily. 03/01/20  Yes Sherran Needs, NP  DULoxetine (CYMBALTA) 60 MG capsule TAKE 1 CAPSULE BY MOUTH EVERY DAY Patient taking differently: Take 60 mg by mouth daily. 04/26/20  Yes [name], [name], MD  ELIQUIS 5 MG TABS tablet TAKE 1 TABLET BY MOUTH TWICE A DAY Patient taking differently: Take 5 mg by mouth 2 (two) times daily. 04/28/20  Yes Camnitz, Will Hassell Done, MD  fluticasone (FLONASE) 50 MCG/ACT nasal spray Place 1 spray into both nostrils at bedtime.   Yes [provider]  loratadine (CLARITIN) 10 MG tablet TAKE 1 TABLET BY MOUTH EVERY DAY Patient taking differently: Take 10 mg by mouth daily. 04/07/20  Yes [name], [name], MD  Lysine 1000 MG TABS Take 1,000 mg by mouth at bedtime.    Yes [provider]  metolazone (ZAROXOLYN) 2.5 MG tablet Take 1 tablet (2.5 mg total) by mouth every 4 days AS NEEDED for swelling Patient taking differently: Take 2.5 mg by mouth See admin instructions. Take 1 tablet (2.5 mg total) by mouth every 4 days as needed for swelling/overnight weight gain 05/11/20  Yes Camnitz, Ocie Doyne, MD  metoprolol tartrate (LOPRESSOR) 50 MG tablet Take 1 tablet (50 mg total) by mouth 2 (two) times daily. 06/02/20  Yes Bonnielee Haff, MD  Multiple Vitamin (MULTIVITAMIN WITH MINERALS) TABS tablet Take 1 tablet by mouth daily. Centrum   Yes [provider]  nitroGLYCERIN (NITROSTAT) 0.4 MG SL tablet PLACE 1 TABLET  (0.4 MG TOTAL) UNDER THE TONGUE EVERY 5 (FIVE) MINUTES X 3 DOSES AS NEEDED FOR CHEST PAIN. 05/17/20  Yes Nahser, Wonda Cheng, MD  nystatin-triamcinolone (MYCOLOG II) cream Apply 1 application topically 2 (two) times daily as needed. Patient taking differently: Apply 1 application topically 2 (two) times daily as needed (rash). 01/28/20  Yes [name], [name], MD  Polyethyl Glycol-Propyl Glycol (SYSTANE) 0.4-0.3 % SOLN Place 1 drop into both eyes at bedtime.   Yes [provider]  potassium chloride SA (KLOR-CON M20) 20 MEQ tablet Take 1 tablet (20 mEq total) by mouth 2 (two) times daily. Please make yearly appt with Dr. Acie Fredrickson for January 2022 for future refills. Thank you 1st attempt Patient taking differently: Take 20 mEq by mouth See admin instructions. Take one tablet (20 meq) by mouth twice daily, may take an extra tablet (20 mg) daily as needed for leg cramps.  Please make yearly appt with Dr. Acie Fredrickson for January 2022 for future refills. Thank you 1st attempt 05/04/20  Yes Nahser, Wonda Cheng, MD  torsemide (DEMADEX) 20 MG tablet Take 2 tablets (40 mg total) by mouth 2 (two) times daily. Patient taking differently: Take 40 mg by mouth See admin instructions. Take 2 tablets (40 mg) by mouth twice daily - morning and 2pm 07/11/19  Yes Nahser, Wonda Cheng, MD  valACYclovir (VALTREX) 1000 MG tablet Take 2 tablets (2,000 mg total) by mouth every 12 (twelve) hours as needed (fever blisters/cold sores.). 10/17/19  Yes [name], [name], MD  vitamin E 1000 UNIT capsule Take 1,000 Units by mouth at bedtime.    Yes [provider]  benzonatate (TESSALON) 100 MG capsule Take 1 capsule (100 mg total) by mouth 3 (three) times daily as needed for cough. Patient not taking: No sig reported 06/02/20   Bonnielee Haff, MD  metFORMIN (GLUCOPHAGE) 500 MG tablet Take 1 tablet (500 mg total) by mouth 2 (two) times daily with a meal. 06/26/20   Fredia Sorrow,  MD    Allergies    Hydrocodone-guaifenesin and Adhesive  [tape]  Review of Systems   Review of Systems  Constitutional: Negative for chills and fever.  HENT: Negative for congestion, rhinorrhea and sore throat.   Eyes: Negative for visual disturbance.  Respiratory: Negative for cough and shortness of breath.   Cardiovascular: Negative for chest pain and leg swelling.  Gastrointestinal: Negative for abdominal pain, diarrhea, nausea and vomiting.  Endocrine: Positive for polyuria.  Genitourinary: Positive for frequency. Negative for dysuria.  Musculoskeletal: Negative for back pain and neck pain.  Skin: Negative for rash.  Neurological: Positive for dizziness and light-headedness. Negative for headaches.  Hematological: Does not bruise/bleed easily.  Psychiatric/Behavioral: Negative for confusion.    Physical Exam Updated Vital Signs BP 125/60   Pulse 74   Temp 99 F (37.2 C) (Oral)   Resp (!) 28   Ht 1.689 m (5' 6.5")   Wt 121.6 kg   SpO2 91%   BMI 42.61 kg/m   Physical Exam  ED Results / Procedures / Treatments   Labs (all labs ordered are listed, but only abnormal results are displayed) Labs Reviewed  BASIC METABOLIC PANEL - Abnormal; Notable for the following components:      Result Value   Sodium 133 (*)    Potassium 3.4 (*)    Chloride 92 (*)    Glucose, Bld 370 (*)    BUN 27 (*)    Creatinine, Ser 1.36 (*)    Calcium 8.7 (*)    GFR, Estimated 41 (*)    All other components within normal limits  URINALYSIS, ROUTINE W REFLEX MICROSCOPIC - Abnormal; Notable for the following components:   Color, Urine STRAW (*)    Glucose, UA 150 (*)    All other components within normal limits  CBG MONITORING, ED - Abnormal; Notable for the following components:   Glucose-Capillary 393 (*)    All other components within normal limits  CBG MONITORING, ED - Abnormal; Notable for the following components:   Glucose-Capillary 263 (*)    All other components within normal limits  CBC  CBG MONITORING, ED    EKG EKG  Interpretation  Date/Time:  Saturday June 26 2020 09:36:23 EST Ventricular Rate:  74 PR Interval:  142 QRS Duration: 80 QT Interval:  446 QTC Calculation: 495 R Axis:   70 Text Interpretation: Sinus rhythm with Premature supraventricular complexes Prolonged QT Abnormal ECG Confirmed by Fredia Sorrow [phone]) on 06/26/2020 10:23:28 AM   Radiology CT Head Wo Contrast  Result Date: 06/26/2020 CLINICAL DATA:  Dizziness, hyperglycemia EXAM: CT HEAD WITHOUT CONTRAST TECHNIQUE: Contiguous axial images were obtained from the base of the skull through the vertex without intravenous contrast. COMPARISON:  05/26/2020, 04/21/2010 FINDINGS: Brain: No evidence of acute infarction, hemorrhage, hydrocephalus, extra-axial collection or mass lesion/mass effect. Scattered low-density changes within the periventricular and subcortical white matter compatible with chronic microvascular ischemic change. Mild diffuse cerebral volume loss. Vascular: Redemonstrated anterior communicating artery aneurysm measuring approximately 8 x 5 x 6 mm (series 6, image 34), which appears minimally increased in size in the AP dimension compared to prior CT angiogram of 2011. Atherosclerotic calcifications involving the large vessels of the skull base. Skull: Normal. Negative for fracture or focal lesion. Sinuses/Orbits: No acute finding. Other: None. IMPRESSION: 1. No acute intracranial findings. 2. Chronic microvascular ischemic change and cerebral volume loss. 3. Redemonstrated anterior communicating artery aneurysm measuring up to 8 mm, which appears minimally increased in size compared to prior CT angiogram  of 2011. Electronically Signed   By: Davina Poke D.O.   On: 06/26/2020 11:33    Procedures Procedures (including critical care time)  Medications Ordered in ED Medications  0.9 %  sodium chloride infusion ( Intravenous Stopped 06/26/20 1255)  sodium chloride 0.9 % bolus 500 mL (0 mLs Intravenous Stopped 06/26/20  1255)    ED Course  I have reviewed the triage vital signs and the nursing notes.  Pertinent labs & imaging results that were available during my care of the patient were reviewed by me and considered in my medical decision making (see chart for details).    MDM Rules/Calculators/A&P                          Work-up for the dizziness lightheadedness was most likely secondary to the elevated blood sugars most of this month.  Head CT without any acute findings.  Initial blood sugar here was high.  With IV fluids came down to 263.  No significant electrolyte abnormalities.  Urinalysis negative for urinary tract infection.  Since patient does have a history of chronic diastolic congestive heart failure.  And is on diuretics.  Did consult with cardiology whether be any concern with starting her on Metformin.  They were fine with that.  Patient will be started on Metformin follow-up with her primary care doctor in a week for rechecks.  Will return for any new or worse symptoms.    Final Clinical Impression(s) / ED Diagnoses Final diagnoses:  Hyperglycemia  Type 2 diabetes mellitus without complication, without long-term current use of insulin ([place])    Rx / DC Orders ED Discharge Orders         Ordered    metFORMIN (GLUCOPHAGE) 500 MG tablet  2 times daily with meals        06/26/20 1512           Fredia Sorrow, MD 06/26/20 1517

## 2020-06-26 NOTE — Discharge Instructions (Addendum)
Take the Metformin as directed.  Follow-up with both cardiology as well as your primary care doctors.  You need to be seen by your primary care doctor in about a week to have your blood sugars reevaluated.  Return for any new or worse symptoms.

## 2020-06-26 NOTE — ED Notes (Signed)
Pt discharge instructions reviewed with the patient. The patient verbalized understanding of instructions. Pt discharged. 

## 2020-06-28 ENCOUNTER — Telehealth (HOSPITAL_COMMUNITY): Payer: Self-pay | Admitting: Emergency Medicine

## 2020-06-28 ENCOUNTER — Other Ambulatory Visit: Payer: Self-pay | Admitting: Cardiology

## 2020-06-28 NOTE — Telephone Encounter (Signed)
Reaching out to patient to offer assistance regarding upcoming cardiac imaging study; pt verbalizes understanding of appt date/time, parking situation and where to check in, pre-test NPO status and medications ordered, and verified current allergies; name and call back number provided for further questions should they arise Samantha Bond RN Navigator Cardiac Imaging [place] and Vascular [phone] office [phone] cell   Pt informed of additional appt made for IVF at infusion clinic at 12p. Pt verbalized understanding .  [name]

## 2020-06-30 ENCOUNTER — Other Ambulatory Visit: Payer: Self-pay

## 2020-06-30 ENCOUNTER — Ambulatory Visit (HOSPITAL_COMMUNITY)
Admission: RE | Admit: 2020-06-30 | Discharge: 2020-06-30 | Disposition: A | Discharge status: Home / Self Care | Payer: PPO | Source: Ambulatory Visit | Attending: Cardiology | Admitting: Cardiology

## 2020-06-30 DIAGNOSIS — I4819 Other persistent atrial fibrillation: Secondary | ICD-10-CM

## 2020-06-30 LAB — BASIC METABOLIC PANEL
Anion gap: 18 — ABNORMAL HIGH (ref 5–15)
BUN: 28 mg/dL — ABNORMAL HIGH (ref 8–23)
CO2: 25 mmol/L (ref 22–32)
Calcium: 9.3 mg/dL (ref 8.9–10.3)
Chloride: 93 mmol/L — ABNORMAL LOW (ref 98–111)
Creatinine, Ser: 1.37 mg/dL — ABNORMAL HIGH (ref 0.44–1.00)
GFR, Estimated: 41 mL/min — ABNORMAL LOW (ref >60)
Glucose, Bld: 128 mg/dL — ABNORMAL HIGH (ref 70–99)
Potassium: 3 mmol/L — ABNORMAL LOW (ref 3.5–5.1)
Sodium: 136 mmol/L (ref 135–145)

## 2020-06-30 MED ORDER — IOHEXOL 350 MG/ML SOLN
80.0000 mL | Freq: Once | INTRAVENOUS | Status: AC | PRN
  Administered 2020-06-30: 80 mL via INTRAVENOUS

## 2020-06-30 MED ORDER — SODIUM CHLORIDE 0.9 % WEIGHT BASED INFUSION: 1.0000 mL/kg/h | INTRAVENOUS | Status: DC

## 2020-06-30 MED ORDER — SODIUM CHLORIDE 0.9 % WEIGHT BASED INFUSION
3.0000 mL/kg/h | INTRAVENOUS | Status: AC
  Administered 2020-06-30: 13:00:00 3 mL/kg/h via INTRAVENOUS

## 2020-06-30 NOTE — Progress Notes (Signed)
Pt arrived from CT to complete remainder of IVF

## 2020-06-30 NOTE — Progress Notes (Addendum)
Notified [name] in CT that 1 hour IVF will be completed by 1400. Notified CT that IV infiltrated and we will call when IVteam restarts.

## 2020-07-05 ENCOUNTER — Other Ambulatory Visit (HOSPITAL_COMMUNITY)
Admission: RE | Admit: 2020-07-05 | Discharge: 2020-07-05 | Disposition: A | Discharge status: Home / Self Care | Payer: PPO | Source: Ambulatory Visit | Attending: Cardiology | Admitting: Cardiology

## 2020-07-05 DIAGNOSIS — Z20822 Contact with and (suspected) exposure to covid-19: Secondary | ICD-10-CM | POA: Diagnosis not present

## 2020-07-05 DIAGNOSIS — Z01812 Encounter for preprocedural laboratory examination: Secondary | ICD-10-CM | POA: Diagnosis not present

## 2020-07-05 LAB — SARS CORONAVIRUS 2 (TAT 6-24 HRS): SARS Coronavirus 2: NEGATIVE

## 2020-07-06 NOTE — Progress Notes (Signed)
Instructed patient on the following items: Arrival time 0630 Nothing to eat or drink after midnight No meds AM of procedure Responsible person to drive you home and stay with you for 24 hrs  Have you missed any doses of anti-coagulant Eliquis- hasn't missed any doses.   

## 2020-07-07 ENCOUNTER — Other Ambulatory Visit: Payer: Self-pay | Admitting: Cardiovascular Disease

## 2020-07-07 ENCOUNTER — Ambulatory Visit (HOSPITAL_COMMUNITY)
Admission: RE | Admit: 2020-07-07 | Discharge: 2020-07-07 | Disposition: A | Discharge status: Home / Self Care | Payer: PPO | Attending: Cardiology | Admitting: Cardiology

## 2020-07-07 ENCOUNTER — Encounter (HOSPITAL_COMMUNITY)
Admission: RE | Disposition: A | Discharge status: Home / Self Care | Payer: PPO | Source: Home / Self Care | Attending: Cardiology

## 2020-07-07 ENCOUNTER — Ambulatory Visit (HOSPITAL_COMMUNITY): Payer: PPO | Admitting: Anesthesiology

## 2020-07-07 DIAGNOSIS — Z8249 Family history of ischemic heart disease and other diseases of the circulatory system: Secondary | ICD-10-CM | POA: Diagnosis not present

## 2020-07-07 DIAGNOSIS — Z888 Allergy status to other drugs, medicaments and biological substances status: Secondary | ICD-10-CM | POA: Diagnosis not present

## 2020-07-07 DIAGNOSIS — Z885 Allergy status to narcotic agent status: Secondary | ICD-10-CM | POA: Diagnosis not present

## 2020-07-07 DIAGNOSIS — I484 Atypical atrial flutter: Secondary | ICD-10-CM | POA: Diagnosis not present

## 2020-07-07 DIAGNOSIS — Z87891 Personal history of nicotine dependence: Secondary | ICD-10-CM | POA: Insufficient documentation

## 2020-07-07 DIAGNOSIS — I4819 Other persistent atrial fibrillation: Secondary | ICD-10-CM | POA: Diagnosis not present

## 2020-07-07 DIAGNOSIS — E876 Hypokalemia: Secondary | ICD-10-CM | POA: Diagnosis not present

## 2020-07-07 DIAGNOSIS — E785 Hyperlipidemia, unspecified: Secondary | ICD-10-CM | POA: Diagnosis not present

## 2020-07-07 HISTORY — PX: ATRIAL FIBRILLATION ABLATION: EP1191

## 2020-07-07 LAB — BASIC METABOLIC PANEL
Anion gap: 17 — ABNORMAL HIGH (ref 5–15)
BUN: 39 mg/dL — ABNORMAL HIGH (ref 8–23)
CO2: 27 mmol/L (ref 22–32)
Calcium: 9.8 mg/dL (ref 8.9–10.3)
Chloride: 90 mmol/L — ABNORMAL LOW (ref 98–111)
Creatinine, Ser: 1.54 mg/dL — ABNORMAL HIGH (ref 0.44–1.00)
GFR, Estimated: 35 mL/min — ABNORMAL LOW (ref >60)
Glucose, Bld: 241 mg/dL — ABNORMAL HIGH (ref 70–99)
Potassium: 3.5 mmol/L (ref 3.5–5.1)
Sodium: 134 mmol/L — ABNORMAL LOW (ref 135–145)

## 2020-07-07 LAB — GLUCOSE, CAPILLARY
Glucose-Capillary: 232 mg/dL — ABNORMAL HIGH (ref 70–99)
Glucose-Capillary: 169 mg/dL — ABNORMAL HIGH (ref 70–99)

## 2020-07-07 LAB — POCT ACTIVATED CLOTTING TIME: Activated Clotting Time: 309 seconds

## 2020-07-07 SURGERY — ATRIAL FIBRILLATION ABLATION
Anesthesia: General

## 2020-07-07 MED ORDER — HEPARIN SODIUM (PORCINE) 1000 UNIT/ML IJ SOLN
INTRAMUSCULAR | Status: DC | PRN
  Administered 2020-07-07: 1000 [IU] via INTRAVENOUS

## 2020-07-07 MED ORDER — CEFAZOLIN SODIUM-DEXTROSE 2-4 GM/100ML-% IV SOLN
INTRAVENOUS | Status: AC
  Filled 2020-07-07: qty 100

## 2020-07-07 MED ORDER — ONDANSETRON HCL 4 MG/2ML IJ SOLN
INTRAMUSCULAR | Status: DC | PRN
  Administered 2020-07-07: 4 mg via INTRAVENOUS

## 2020-07-07 MED ORDER — CEFAZOLIN SODIUM-DEXTROSE 2-3 GM-%(50ML) IV SOLR
INTRAVENOUS | Status: DC | PRN
  Administered 2020-07-07: 2 g via INTRAVENOUS

## 2020-07-07 MED ORDER — SUGAMMADEX SODIUM 200 MG/2ML IV SOLN
INTRAVENOUS | Status: DC | PRN
  Administered 2020-07-07: 400 mg via INTRAVENOUS

## 2020-07-07 MED ORDER — FENTANYL CITRATE (PF) 100 MCG/2ML IJ SOLN
INTRAMUSCULAR | Status: DC | PRN
Start: 2020-07-07 — End: 2020-07-07
  Administered 2020-07-07: 50 ug via INTRAVENOUS

## 2020-07-07 MED ORDER — SODIUM CHLORIDE 0.9% FLUSH: 3.0000 mL | Freq: Two times a day (BID) | INTRAVENOUS | Status: DC

## 2020-07-07 MED ORDER — HEPARIN SODIUM (PORCINE) 1000 UNIT/ML IJ SOLN
INTRAMUSCULAR | Status: DC | PRN
  Administered 2020-07-07: 2000 [IU] via INTRAVENOUS
  Administered 2020-07-07: 15000 [IU] via INTRAVENOUS

## 2020-07-07 MED ORDER — DOBUTAMINE IN D5W 4-5 MG/ML-% IV SOLN
INTRAVENOUS | Status: AC
  Filled 2020-07-07: qty 250

## 2020-07-07 MED ORDER — PROTAMINE SULFATE 10 MG/ML IV SOLN
INTRAVENOUS | Status: DC | PRN
  Administered 2020-07-07: 10 mg via INTRAVENOUS
  Administered 2020-07-07: 30 mg via INTRAVENOUS

## 2020-07-07 MED ORDER — LIDOCAINE 2% (20 MG/ML) 5 ML SYRINGE
INTRAMUSCULAR | Status: DC | PRN
  Administered 2020-07-07: 40 mg via INTRAVENOUS

## 2020-07-07 MED ORDER — DOBUTAMINE IN D5W 4-5 MG/ML-% IV SOLN
INTRAVENOUS | Status: DC | PRN
  Administered 2020-07-07: 20 ug/kg/min via INTRAVENOUS

## 2020-07-07 MED ORDER — APIXABAN 5 MG PO TABS
5.0000 mg | ORAL_TABLET | ORAL | Status: AC
  Administered 2020-07-07: 5 mg via ORAL
  Filled 2020-07-07: qty 1

## 2020-07-07 MED ORDER — HEPARIN (PORCINE) IN NACL 1000-0.9 UT/500ML-% IV SOLN
INTRAVENOUS | Status: DC | PRN
  Administered 2020-07-07 (×5): 500 mL

## 2020-07-07 MED ORDER — ONDANSETRON HCL 4 MG/2ML IJ SOLN: 4.0000 mg | Freq: Four times a day (QID) | INTRAMUSCULAR | Status: DC | PRN

## 2020-07-07 MED ORDER — PHENYLEPHRINE HCL-NACL 10-0.9 MG/250ML-% IV SOLN
INTRAVENOUS | Status: DC | PRN
  Administered 2020-07-07: 40 ug/min via INTRAVENOUS

## 2020-07-07 MED ORDER — ACETAMINOPHEN 325 MG PO TABS
650.0000 mg | ORAL_TABLET | ORAL | Status: DC | PRN
  Filled 2020-07-07: qty 2

## 2020-07-07 MED ORDER — FENTANYL CITRATE (PF) 250 MCG/5ML IJ SOLN: INTRAMUSCULAR | Status: DC | PRN

## 2020-07-07 MED ORDER — ADENOSINE 6 MG/2ML IV SOLN
INTRAVENOUS | Status: DC | PRN
  Administered 2020-07-07 (×2): 12 mg via INTRAVENOUS

## 2020-07-07 MED ORDER — PROPOFOL 10 MG/ML IV BOLUS
INTRAVENOUS | Status: DC | PRN
  Administered 2020-07-07: 130 mg via INTRAVENOUS

## 2020-07-07 MED ORDER — SODIUM CHLORIDE 0.9 % IV SOLN: INTRAVENOUS | Status: DC

## 2020-07-07 MED ORDER — ADENOSINE 6 MG/2ML IV SOLN
INTRAVENOUS | Status: AC
  Filled 2020-07-07: qty 8

## 2020-07-07 MED ORDER — ROCURONIUM BROMIDE 10 MG/ML (PF) SYRINGE
PREFILLED_SYRINGE | INTRAVENOUS | Status: DC | PRN
  Administered 2020-07-07 (×2): 20 mg via INTRAVENOUS
  Administered 2020-07-07: 60 mg via INTRAVENOUS

## 2020-07-07 MED ORDER — SODIUM CHLORIDE 0.9 % IV SOLN: 250.0000 mL | INTRAVENOUS | Status: DC | PRN

## 2020-07-07 MED ORDER — SODIUM CHLORIDE 0.9% FLUSH: 3.0000 mL | INTRAVENOUS | Status: DC | PRN

## 2020-07-07 MED ORDER — METOCLOPRAMIDE HCL 5 MG/ML IJ SOLN
INTRAMUSCULAR | Status: DC | PRN
  Administered 2020-07-07: 40 mg via INTRAVENOUS
  Administered 2020-07-07: 5 mg via INTRAVENOUS

## 2020-07-07 SURGICAL SUPPLY — 23 items
BAG SNAP BAND KOVER 36X36 (MISCELLANEOUS) ×1 IMPLANT
BLANKET WARM UNDERBOD FULL ACC (MISCELLANEOUS) ×2 IMPLANT
CATH 8FR REPROCESSED SOUNDSTAR (CATHETERS) ×2 IMPLANT
CATH 8FR SOUNDSTAR REPROCESSED (CATHETERS) IMPLANT
CATH MAPPNG PENTARAY F 2-6-2MM (CATHETERS) IMPLANT
CATH S CIRCA THERM PROBE 10F (CATHETERS) ×1 IMPLANT
CATH SMTCH THERMOCOOL SF DF (CATHETERS) ×1 IMPLANT
CATH WEB BI DIR CSDF CRV REPRO (CATHETERS) ×1 IMPLANT
CLOSURE PERCLOSE PROSTYLE (VASCULAR PRODUCTS) ×4 IMPLANT
COVER SWIFTLINK CONNECTOR (BAG) ×2 IMPLANT
KIT VERSACROSS STEERABLE D1 (CATHETERS) ×1 IMPLANT
MAT PREVALON FULL STRYKER (MISCELLANEOUS) ×1 IMPLANT
PACK EP LATEX FREE (CUSTOM PROCEDURE TRAY) ×2
PACK EP LF (CUSTOM PROCEDURE TRAY) ×1 IMPLANT
PAD PRO RADIOLUCENT 2001M-C (PAD) ×2 IMPLANT
PATCH CARTO3 (PAD) ×1 IMPLANT
PENTARAY F 2-6-2MM (CATHETERS) ×2
SHEATH CARTO VIZIGO SM CVD (SHEATH) ×1 IMPLANT
SHEATH PINNACLE 7F 10CM (SHEATH) ×1 IMPLANT
SHEATH PINNACLE 8F 10CM (SHEATH) ×2 IMPLANT
SHEATH PINNACLE 9F 10CM (SHEATH) ×1 IMPLANT
SHEATH PROBE COVER 6X72 (BAG) ×1 IMPLANT
TUBING SMART ABLATE COOLFLOW (TUBING) ×1 IMPLANT

## 2020-07-07 NOTE — Anesthesia Preprocedure Evaluation (Signed)
Anesthesia Evaluation  Patient identified by MRN, date of birth, ID band Patient awake    Reviewed: Allergy & Precautions, NPO status , Patient's Chart, lab work & pertinent test results  Airway Mallampati: III  TM Distance: >3 FB Neck ROM: Full    Dental  (+) Teeth Intact, Dental Advisory Given   Pulmonary former smoker,    breath sounds clear to auscultation       Cardiovascular hypertension,  Rhythm:Irregular Rate:Normal     Neuro/Psych    GI/Hepatic   Endo/Other    Renal/GU      Musculoskeletal   Abdominal (+) + obese,   Peds  Hematology   Anesthesia Other Findings   Reproductive/Obstetrics                             Anesthesia Physical Anesthesia Plan  ASA: III  Anesthesia Plan: General   Post-op Pain Management:    Induction: Intravenous  PONV Risk Score and Plan: Ondansetron and Metaclopromide  Airway Management Planned: Oral ETT  Additional Equipment:   Intra-op Plan:   Post-operative Plan: Extubation in OR  Informed Consent: I have reviewed the patients History and Physical, chart, labs and discussed the procedure including the risks, benefits and alternatives for the proposed anesthesia with the patient or authorized representative who has indicated his/her understanding and acceptance.     Dental advisory given  Plan Discussed with: CRNA and Anesthesiologist  Anesthesia Plan Comments: (Chronic Afib S/P ablations x 3 HFPEF Recently hospitalized for RSV Type 2 DM OSA on CPAP Renal insuff Cr. 1.37  Plan GA with oral ETT)        Anesthesia Quick Evaluation

## 2020-07-07 NOTE — Anesthesia Procedure Notes (Signed)
Procedure Name: Intubation Date/Time: 07/07/2020 8:44 AM Performed by: Janace Litten, CRNA Pre-anesthesia Checklist: Patient identified, Emergency Drugs available, Suction available and Patient being monitored Patient Re-evaluated:Patient Re-evaluated prior to induction Oxygen Delivery Method: Circle System Utilized Preoxygenation: Pre-oxygenation with 100% oxygen Induction Type: IV induction Ventilation: Mask ventilation without difficulty and Oral airway inserted - appropriate to patient size Laryngoscope Size: Mac and 3 Grade View: Grade I Tube type: Oral Tube size: 7.0 mm Number of attempts: 1 Airway Equipment and Method: Stylet Placement Confirmation: ETT inserted through vocal cords under direct vision,  positive ETCO2 and breath sounds checked- equal and bilateral Secured at: 22 cm Tube secured with: Tape Dental Injury: Teeth and Oropharynx as per pre-operative assessment

## 2020-07-07 NOTE — Discharge Instructions (Signed)
Post procedure care instructions No driving for 4 days. No lifting over 5 lbs for 1 week. No vigorous or sexual activity for 1 week. You may return to work/your usual activities on 07/14/20. Keep procedure site clean & dry. If you notice increased pain, swelling, bleeding or pus, call/return!  You may shower after 24 hours, but no soaking in baths/hot tubs/pools for 1 week.      Cardiac Ablation, Care After This sheet gives you information about how to care for yourself after your procedure. Your health care provider may also give you more specific instructions. If you have problems or questions, contact your health care provider. What can I expect after the procedure? After the procedure, it is common to have:  Bruising around your puncture site.  Tenderness around your puncture site.  Skipped heartbeats.  Tiredness (fatigue). Follow these instructions at home: Puncture site care   Follow instructions from your health care provider about how to take care of your puncture site. Make sure you: ? Wash your hands with soap and water before you change your bandage (dressing). If soap and water are not available, use hand sanitizer. ? Change your dressing as told by your health care provider. ? Leave stitches (sutures), skin glue, or adhesive strips in place. These skin closures may need to stay in place for up to 2 weeks. If adhesive strip edges start to loosen and curl up, you may trim the loose edges. Do not remove adhesive strips completely unless your health care provider tells you to do that.  Check your puncture site every day for signs of infection. Check for: ? Redness, swelling, or pain. ? Fluid or blood. If your puncture site starts to bleed, lie down on your back, apply firm pressure to the area, and contact your health care provider. ? Warmth. ? Pus or a bad smell. Driving  Ask your health care provider when it is safe for you to drive again after the procedure.  Do not  drive or use heavy machinery while taking prescription pain medicine.  Do not drive for 24 hours if you were given a medicine to help you relax (sedative) during your procedure. Activity  Avoid activities that take a lot of effort for at least 3 days after your procedure.  Do not lift anything that is heavier than 10 lb (4.5 kg), or the limit that you are told, until your health care provider says that it is safe.  Return to your normal activities as told by your health care provider. Ask your health care provider what activities are safe for you. General instructions  Take over-the-counter and prescription medicines only as told by your health care provider.  Do not use any products that contain nicotine or tobacco, such as cigarettes and e-cigarettes. If you need help quitting, ask your health care provider.  Do not take baths, swim, or use a hot tub until your health care provider approves.  Do not drink alcohol for 24 hours after your procedure.  Keep all follow-up visits as told by your health care provider. This is important. Contact a health care provider if:  You have redness, mild swelling, or pain around your puncture site.  You have fluid or blood coming from your puncture site that stops after applying firm pressure to the area.  Your puncture site feels warm to the touch.  You have pus or a bad smell coming from your puncture site.  You have a fever.  You have chest pain  or discomfort that spreads to your neck, jaw, or arm.  You are sweating a lot.  You feel nauseous.  You have a fast or irregular heartbeat.  You have shortness of breath.  You are dizzy or light-headed and feel the need to lie down.  You have pain or numbness in the arm or leg closest to your puncture site. Get help right away if:  Your puncture site suddenly swells.  Your puncture site is bleeding and the bleeding does not stop after applying firm pressure to the area. These symptoms  may represent a serious problem that is an emergency. Do not wait to see if the symptoms will go away. Get medical help right away. Call your local emergency services (911 in the [place] Do not drive yourself to the hospital. Summary  After the procedure, it is normal to have bruising and tenderness at the puncture site in your groin, neck, or forearm.  Check your puncture site every day for signs of infection.  Get help right away if your puncture site is bleeding and the bleeding does not stop after applying firm pressure to the area. This is a medical emergency. This information is not intended to replace advice given to you by your health care provider. Make sure you discuss any questions you have with your health care provider. Document Revised: 06/01/2017 Document Reviewed: 09/28/2016 Elsevier Patient Education  2020 [place] have an appointment set up with the [facility].  Multiple studies have shown that being followed by a dedicated atrial fibrillation clinic in addition to the standard care you receive from your other physicians improves health. We believe that enrollment in the atrial fibrillation clinic will allow [place] to better care for you.   The phone number to the [facility] is [phone]. The clinic is staffed Monday through Friday from 8:30am to 5pm.  Parking Directions: The clinic is located in the Heart and Vascular Building connected to [facility]. 1)From [address] turn on to [address] and go to the 3rd entrance  (Heart and Vascular entrance) on the right. 2)Look to the right for Heart &Vascular Parking Garage. 3)A code for the entrance is required, for February is 1212.   4)Take the elevators to the 1st floor. Registration is in the room with the glass walls at the end of the hallway.  If you have any trouble parking or locating the clinic, please don't hesitate to call [phone].

## 2020-07-07 NOTE — H&P (Signed)
Electrophysiology Office Note   Date:  07/07/2020   ID:  CACI ORREN, DOB [birth year], MRN [id number]  PCP:  [name], MD  Primary Electrophysiologist: Gaye Alken, MD    No chief complaint on file.    History of Present Illness: [name] is a 75 y.o. female who presents today for electrophysiology evaluation.     She has a history of coronary artery disease status post RCA stent, chronic diastolic heart failure, atrial fibrillation with tachybradycardia syndrome status post pacemaker, CKD, hypertension, hyperlipidemia, OSA on CPAP.  She has had ablation x3 for her atrial fibrillation.  She has since been loaded on dofetilide.   Today, denies symptoms of palpitations, chest pain, shortness of breath, orthopnea, PND, lower extremity edema, claudication, dizziness, presyncope, syncope, bleeding, or neurologic sequela. The patient is tolerating medications without difficulties. Plan for ablation today.   Past Medical History:  Diagnosis Date  . Anticoagulant long-term use    pradaxa  . Anxiety   . Arthritis    "fingers, lower back" (04/23/2017   . CAD (coronary artery disease) 0272,5366   post PTCA with bare-metal stenting to mid RCA in December 2004     . CHF (congestive heart failure) ([place])   . Chronic atrial fibrillation ([place]) 06/2007   Tachybradycardia pacemaker  . Chronic kidney disease    10% function - ?R, other kidney is compensating    . CVA (cerebral vascular accident) [facility]) [phone]   denies residual on 04/23/2017  . Depression   . Diplopia 06/19/2008   Qualifier: Diagnosis of  By: [name] MD, [name]   . Dysrhythmia    ATRIAL FIBRILATION  . Edema of lower extremity   . Hyperlipidemia   . Hypertension   . Inferior myocardial infarction [facility])    acute inferior wall mi/other medical hx  . Myocardial infarction ([place]) [id number]  . Obesity   . OSA on CPAP    last test- 2010  . Pacemaker   . Pneumonia 2014   tx. ----  [facility]   . Pulmonary hypertension ([place])    moderate pulmonary hypertension by 10/2016 echo and 10/2013 cardiac cath  . Shortness of breath   . Skin cancer    "cut off right J[place]ma; burned off LLE" (04/23/2017)  . Sleep apnea   . Spondylolisthesis   . TIA (transient ischemic attack) 2008  . Unspecified hemorrhoids without mention of complication 5/95/6387   Colonoscopy--Dr. Carlean Purl    Past Surgical History:  Procedure Laterality Date  . ABDOMINAL HYSTERECTOMY    . APPENDECTOMY  1984  . ATRIAL FIBRILLATION ABLATION N/A 09/29/2016   Procedure: Atrial Fibrillation Ablation;  Surgeon: Malone Admire Meredith Leeds, MD;  Location: [place] CV LAB;  Service: Cardiovascular;  Laterality: N/A;  . ATRIAL FIBRILLATION ABLATION N/A 02/07/2018   Procedure: ATRIAL FIBRILLATION ABLATION;  Surgeon: Constance Haw, MD;  Location: [place] CV LAB;  Service: Cardiovascular;  Laterality: N/A;  . ATRIAL FIBRILLATION ABLATION N/A 03/13/2019   Procedure: ATRIAL FIBRILLATION ABLATION;  Surgeon: Constance Haw, MD;  Location: [place] CV LAB;  Service: Cardiovascular;  Laterality: N/A;  . BACK SURGERY    . CARDIOVERSION N/A 09/12/2017   Procedure: CARDIOVERSION;  Surgeon: Jerline Pain, MD;  Location: [facility] ENDOSCOPY;  Service: Cardiovascular;  Laterality: N/A;  . CARDIOVERSION N/A 12/13/2017   Procedure: CARDIOVERSION;  Surgeon: Sanda Klein, MD;  Location: [facility] ENDOSCOPY;  Service: Cardiovascular;  Laterality: N/A;  . CARDIOVERSION N/A 02/18/2018   Procedure: CARDIOVERSION;  Surgeon: Dorothy Spark, MD;  Location: [facility] ENDOSCOPY;  Service: Cardiovascular;  Laterality: N/A;  . CARDIOVERSION N/A 05/20/2018   Procedure: CARDIOVERSION;  Surgeon: [name], MD;  Location: [facility] ENDOSCOPY;  Service: Cardiovascular;  Laterality: N/A;  . CARDIOVERSION N/A 04/16/2019   Procedure: CARDIOVERSION;  Surgeon: Donato Heinz, MD;  Location: [facility] ENDOSCOPY;  Service: Endoscopy;  Laterality: N/A;  . CARDIOVERSION N/A  01/22/2020   Procedure: CARDIOVERSION;  Surgeon: Geralynn Rile, MD;  Location: [place];  Service: Cardiovascular;  Laterality: N/A;  . CARDIOVERSION N/A 02/18/2020   Procedure: CARDIOVERSION;  Surgeon: Elouise Munroe, MD;  Location: [facility] ENDOSCOPY;  Service: Cardiovascular;  Laterality: N/A;  . CATARACT EXTRACTION W/ INTRAOCULAR LENS  IMPLANT, BILATERAL Bilateral 01/15/2017- 03/2017  . CHOLECYSTECTOMY    . CORONARY ANGIOPLASTY  X 2  . CORONARY ANGIOPLASTY WITH STENT PLACEMENT  1998; ~ 2007; ?date   "1 stent; replaced stent; not sure when I got the last stent" (04/23/2017)  . DOPPLER ECHOCARDIOGRAPHY  2009  . ELECTROPHYSIOLOGIC STUDY N/A 03/31/2016   Procedure: Cardioversion;  Surgeon: Evans Lance, MD;  Location: [place] CV LAB;  Service: Cardiovascular;  Laterality: N/A;  . ELECTROPHYSIOLOGIC STUDY N/A 08/04/2016   Procedure: Cardioversion;  Surgeon: Evans Lance, MD;  Location: [place] CV LAB;  Service: Cardiovascular;  Laterality: N/A;  . INSERT / REPLACE / REMOVE PACEMAKER  06/2007  . IR RADIOLOGY PERIPHERAL GUIDED IV START  01/31/2018  . IR US GUIDE VASC ACCESS LEFT  01/31/2018  . JOINT REPLACEMENT    . LAPAROSCOPIC CHOLECYSTECTOMY  1994  . LEFT AND RIGHT HEART CATHETERIZATION WITH CORONARY ANGIOGRAM N/A 10/06/2013   Procedure: LEFT AND RIGHT HEART CATHETERIZATION WITH CORONARY ANGIOGRAM;  Surgeon: Troy Sine, MD;  Location: [facility] CATH LAB;  Service: Cardiovascular;  Laterality: N/A;  . LEFT OOPHORECTOMY Left ~ 1989  . POSTERIOR LUMBAR FUSION  2000s - 04/2015 X 3   L3-4; L4-5; L2-3; Dr Trenton Gammon  . RIGHT/LEFT HEART CATH AND CORONARY ANGIOGRAPHY N/A 05/09/2017   Procedure: RIGHT/LEFT HEART CATH AND CORONARY ANGIOGRAPHY;  Surgeon: Sherren Mocha, MD;  Location: [place] CV LAB;  Service: Cardiovascular;  Laterality: N/A;  . SKIN CANCER EXCISION Right    [place]  . TEE WITHOUT CARDIOVERSION N/A 09/29/2016   Procedure: TRANSESOPHAGEAL ECHOCARDIOGRAM (TEE);  Surgeon: Jerline Pain, MD;  Location: [place];  Service: Cardiovascular;  Laterality: N/A;  . TOTAL ABDOMINAL HYSTERECTOMY  1984   "uterus & right ovary"  . TOTAL KNEE ARTHROPLASTY Left 04/23/2017  . TOTAL KNEE ARTHROPLASTY Left 04/23/2017   Procedure: TOTAL KNEE ARTHROPLASTY;  Surgeon: Frederik Pear, MD;  Location: [place];  Service: Orthopedics;  Laterality: Left;  . ULTRASOUND GUIDANCE FOR VASCULAR ACCESS  05/09/2017   Procedure: Ultrasound Guidance For Vascular Access;  Surgeon: Sherren Mocha, MD;  Location: [place] CV LAB;  Service: Cardiovascular;;     Current Facility-Administered Medications  Medication Dose Route Frequency Provider Last Rate Last Admin  . 0.9 %  sodium chloride infusion   Intravenous Continuous Constance Haw, MD 50 mL/hr at 07/07/20 0654 New Bag at 07/07/20 0654    Allergies:   Hydrocodone-guaifenesin and Adhesive [tape]   Social History:  The patient  reports that she quit smoking about 38 years ago. Her smoking use included cigarettes. She started smoking about 42 years ago. She has a 5.00 pack-year smoking history. She has never used smokeless tobacco. She reports current alcohol use. She reports that she does not use drugs.  Family History:  The patient's family history includes Arrhythmia in her mother; Colon cancer in her maternal aunt; Dementia in her mother; Diabetes in her mother and paternal grandfather; Heart attack in her brother; Heart disease in her paternal aunt; Hypertension in her mother; Prostate cancer in her maternal grandfather; Suicidality in her father.   ROS:  Please see the history of present illness.   Otherwise, review of systems is positive for none.   All other systems are reviewed and negative.   PHYSICAL EXAM: VS:  BP (!) 146/59   Pulse 73   Temp (!) 97.4 F (36.3 C) (Oral)   Ht 5' 6.5" (1.689 m)   Wt 121.6 kg   SpO2 94%   BMI 42.61 kg/m  , BMI Body mass index is 42.61 kg/m. GEN: Well nourished, well developed, in no acute  distress  HEENT: normal  Neck: no JVD, carotid bruits, or masses Cardiac: RRR; no murmurs, rubs, or gallops,no edema  Respiratory:  clear to auscultation bilaterally, normal work of breathing GI: soft, nontender, nondistended, + BS MS: no deformity or atrophy  Skin: warm and dry, device site well healed Neuro:  Strength and sensation are intact Psych: euthymic mood, full affect  Personal review of the device interrogation today. Results in [place]: 01/28/2020: ALT 28; TSH 2.77 05/19/2020: B Natriuretic Peptide 51.8 05/31/2020: Magnesium 2.1 06/26/2020: Hemoglobin 14.5; Platelets 197 07/07/2020: BUN 39; Creatinine, Ser 1.54; Potassium 3.5; Sodium 134    Lipid Panel     Component Value Date/Time   CHOL 151 04/10/2019 0801   CHOL 116 10/16/2016 0850   TRIG 160.0 (H) 04/10/2019 0801   HDL 50.80 04/10/2019 0801   HDL 42 10/16/2016 0850   CHOLHDL 3 04/10/2019 0801   VLDL 32.0 04/10/2019 0801   LDLCALC 68 04/10/2019 0801   LDLCALC 46 10/16/2016 0850     Wt Readings from Last 3 Encounters:  07/07/20 121.6 kg  06/26/20 121.6 kg  06/07/20 120.2 kg      Other studies Reviewed: Additional studies/ records that were reviewed today include: TTE 03/28/16  Review of the above records today demonstrates:  - Left ventricle: The cavity size was normal. Wall thickness was   increased in a pattern of mild LVH. Systolic function was normal.   The estimated ejection fraction was in the range of 55% to 60%.   Indeterminant diastolic function (atrial fibrillation). Wall   motion was normal; there were no regional wall motion   abnormalities. - Aortic valve: There was no stenosis. - Mitral valve: Mildly calcified annulus. There was no significant   regurgitation. - Left atrium: The atrium was mildly dilated. - Right ventricle: The cavity size was normal. Pacer wire or   catheter noted in right ventricle. Systolic function was normal. - Tricuspid valve: Peak RV-RA gradient  (S): 22 mm Hg. - Pulmonary arteries: PA peak pressure: 30 mm Hg (S). - Systemic veins: IVC measured 2.3 cm with normal respirophasic   variation, suggesting RA pressure 8 mmHg.  LHC/RHC 05/09/17 1.  Single-vessel coronary artery disease with continued patency of the stented segment in the right coronary artery and chronic occlusion of a small acute marginal branch collateralized by the left coronary artery 2.  Patent left main, LAD, and left circumflex without significant stenosis 3.  Vigorous LV systolic function with mildly elevated LVEDP 4.  Severe pulmonary hypertension with mean pulmonary artery pressure of 48 mmHg.  Because of high cardiac output (8.5 L/min), the patient's pulmonary vascular resistance  is only mildly increased at 3 Wood units.  ASSESSMENT AND PLAN:  1.  Persistent atrial fibrillation/flutter: [name] has presented today for surgery, with the diagnosis of atrial fibrillation/flutter.  The various methods of treatment have been discussed with the patient and family. After consideration of risks, benefits and other options for treatment, the patient has consented to  Procedure(s): Catheter ablation as a surgical intervention .  Risks include but not limited to complete heart block, stroke, esophageal damage, nerve damage, bleeding, vascular damage, tamponade, perforation, MI, and death. The patient's history has been reviewed, patient examined, no change in status, stable for surgery.  I have reviewed the patient's chart and labs.  Questions were answered to the patient's satisfaction.    Marcial Pless Curt Bears, MD 07/07/2020 7:39 AM

## 2020-07-07 NOTE — Transfer of Care (Signed)
Immediate Anesthesia Transfer of Care Note  Patient: [name]  Procedure(s) Performed: ATRIAL FIBRILLATION ABLATION (N/A )  Patient Location: PACU and Cath Lab  Anesthesia Type:General  Level of Consciousness: awake, alert  and patient cooperative  Airway & Oxygen Therapy: Patient Spontanous Breathing and Patient connected to nasal cannula oxygen  Post-op Assessment: Report given to RN and Post -op Vital signs reviewed and stable  Post vital signs: Reviewed and stable  Last Vitals:  Vitals Value Taken Time  BP 110/48 07/07/20 1048  Temp    Pulse 76 07/07/20 1049  Resp 20 07/07/20 1049  SpO2 94 % 07/07/20 1049  Vitals shown include unvalidated device data.  Last Pain:  Vitals:   07/07/20 0644  TempSrc:   PainSc: 0-No pain      Patients Stated Pain Goal: 3 (34/74/25 9563)  Complications: No complications documented.

## 2020-07-08 ENCOUNTER — Encounter (HOSPITAL_COMMUNITY): Payer: Self-pay | Admitting: Cardiology

## 2020-07-08 NOTE — Anesthesia Postprocedure Evaluation (Signed)
Anesthesia Post Note  Patient: Junia C Panameno  Procedure(s) Performed: ATRIAL FIBRILLATION ABLATION (N/A )     Patient location during evaluation: PACU Anesthesia Type: General Level of consciousness: awake and alert Pain management: pain level controlled Vital Signs Assessment: post-procedure vital signs reviewed and stable Respiratory status: spontaneous breathing, nonlabored ventilation and respiratory function stable Cardiovascular status: blood pressure returned to baseline and stable Postop Assessment: no apparent nausea or vomiting Anesthetic complications: no   No complications documented.  Last Vitals:  Vitals:   07/07/20 1301 07/07/20 1400  BP: (!) 123/47 (!) 123/50  Pulse: 83 83  Resp: 17 20  Temp:    SpO2: 96% 93%    Last Pain:  Vitals:   07/07/20 1400  TempSrc:   PainSc: 0-No pain                 Audry Pili

## 2020-07-09 ENCOUNTER — Other Ambulatory Visit: Payer: Self-pay

## 2020-07-11 NOTE — Progress Notes (Signed)
Chief Complaint  Patient presents with   Hospitalization Follow-up    HPI: [name] 75 y.o. come in for fu  A number or problems   Out of control  BG since 2 hosp    rsv and then ablation recently   dm  eval had inc levels  Was placed on  And put on metformin  Bid    And ran out last night ( has been on for a few weeks   Was hosp  for rsv for 2 weeks and resp failure  In fall  .  Monitoring  doing  Low 200 in am  202 this am  Was placed on metformin bid hosp  Tolerating needs rx  .  Left arm growing lesion left arm since hospital   No pain no bleeding   Left ear .  Still bothering her  Cracking at times  No pain  ( had som sx when had rsv)  Left back ache  No injury could I be kidney? No radiating   O 2 better with hear t rhythm.  rx sinus  But recently   Some swelling edema  Watching fluids sodium sugars  Since  out of hospital   Has had tremors off and on for a while   Has some today  ( has had family members )  On metoprolol    ROS: See pertinent positives and negatives per HPI. No bleeding   Past Medical History:  Diagnosis Date   Anticoagulant long-term use    pradaxa   Anxiety    Arthritis    "fingers, lower back" (04/23/2017    CAD (coronary artery disease) 8921,1941   post PTCA with bare-metal stenting to mid RCA in December 2004      CHF (congestive heart failure) (HCC)    Chronic atrial fibrillation ([place]) 06/2007   Tachybradycardia pacemaker   Chronic kidney disease    10% function - ?R, other kidney is compensating     CVA (cerebral vascular accident) [facility]) [phone]   denies residual on 04/23/2017   Depression    Diplopia 06/19/2008   Qualifier: Diagnosis of  By: [name] MD, [name]    Dysrhythmia    ATRIAL FIBRILATION   Edema of lower extremity    Hyperlipidemia    Hypertension    Inferior myocardial infarction [facility])    acute inferior wall mi/other medical hx   Myocardial infarction [facility]) [id number]   Obesity    OSA on CPAP     last test- 2010   Pacemaker    Pneumonia 2014   tx. ----  [facility]   Pulmonary hypertension ([place])    moderate pulmonary hypertension by 10/2016 echo and 10/2013 cardiac cath   Shortness of breath    Skin cancer    "cut off right [place]; burned off LLE" (04/23/2017)   Sleep apnea    Spondylolisthesis    TIA (transient ischemic attack) 2008   Unspecified hemorrhoids without mention of complication 01/01/6377   Colonoscopy--Dr. Carlean Purl     Family History  Problem Relation Age of Onset   Suicidality Father        suicide death pt was 3 yrs, 1950   Arrhythmia Mother    Hypertension Mother    Diabetes Mother    Dementia Mother    Heart attack Brother    Heart disease Paternal Aunt    Prostate cancer Maternal Grandfather    Diabetes Paternal Grandfather        fathers  side of the family   Colon cancer Maternal Aunt     Social History   Socioeconomic History   Marital status: Married    Spouse name: Not on file   Number of children: 0   Years of education: HS   Highest education level: Not on file  Occupational History   Occupation: retired    Comment: previously worked [employer]  Tobacco Use   Smoking status: Former Smoker    Packs/day: 1.00    Years: 5.00    Pack years: 5.00    Types: Cigarettes    Start date: 05/11/1978    Quit date: 07/03/1982    Years since quitting: 38.0   Smokeless tobacco: Never Used  Vaping Use   Vaping Use: Never used  Substance and Sexual Activity   Alcohol use: Yes    Comment: 04/23/2017 "once q 6 months; glass of wine"   Drug use: No   Sexual activity: Not Currently  Other Topics Concern   Not on file  Social History Narrative   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   Married    Originally from [place] of two, high school education   Former smoker Pension scheme manager dogs 7    Retired from [employer] 2    Social  Determinants of Radio broadcast assistant Strain: Not on [employer] Insecurity: Not on file  Transportation Needs: No Transportation Needs   Film/video editor (Medical): No   Lack of Transportation (Non-Medical): No  Physical Activity: Not on file  Stress: Not on file  Social Connections: Not on file    Outpatient Medications Prior to Visit  Medication Sig Dispense Refill   Ascorbic Acid (VITAMIN C) 1000 MG tablet Take 1,000 mg by mouth at bedtime.      atorvastatin (LIPITOR) 40 MG tablet TAKE 1 TABLET BY MOUTH EVERY DAY 90 tablet 3   Calcium Carbonate-Vitamin D (CALCIUM 600+D PO) Take 1 tablet by mouth daily.      diltiazem (CARDIZEM LA) 360 MG 24 hr tablet Take 1 tablet (360 mg total) by mouth daily. 90 tablet 3   dofetilide (TIKOSYN) 250 MCG capsule Take 1 capsule (250 mcg total) by mouth 2 (two) times daily. 180 capsule 2   DULoxetine (CYMBALTA) 60 MG capsule TAKE 1 CAPSULE BY MOUTH EVERY DAY (Patient taking differently: Take 60 mg by mouth daily.) 90 capsule 1   ELIQUIS 5 MG TABS tablet TAKE 1 TABLET BY MOUTH TWICE A DAY (Patient taking differently: Take 5 mg by mouth 2 (two) times daily.) 180 tablet 2   fluticasone (FLONASE) 50 MCG/ACT nasal spray Place 1 spray into both nostrils at bedtime.     loratadine (CLARITIN) 10 MG tablet TAKE 1 TABLET BY MOUTH EVERY DAY (Patient taking differently: Take 10 mg by mouth daily.) 90 tablet 1   Lysine 1000 MG TABS Take 1,000 mg by mouth at bedtime.      metolazone (ZAROXOLYN) 2.5 MG tablet Take 1 tablet (2.5 mg total) by mouth every 4 days AS NEEDED for swelling (Patient taking differently: Take 2.5 mg by mouth See admin instructions. Take 1 tablet (2.5 mg total) by mouth every 4 days as needed for swelling/overnight weight gain) 7 tablet 2   metoprolol tartrate (LOPRESSOR) 50 MG tablet Take 1 tablet (50 mg total) by mouth 2 (two) times daily. 60 tablet 1   Multiple Vitamin (MULTIVITAMIN WITH MINERALS)  TABS tablet Take 1  tablet by mouth daily. Centrum     nitroGLYCERIN (NITROSTAT) 0.4 MG SL tablet PLACE 1 TABLET (0.4 MG TOTAL) UNDER THE TONGUE EVERY 5 (FIVE) MINUTES X 3 DOSES AS NEEDED FOR CHEST PAIN. 75 tablet 1   nystatin-triamcinolone (MYCOLOG II) cream Apply 1 application topically 2 (two) times daily as needed. (Patient taking differently: Apply 1 application topically 2 (two) times daily as needed (rash).) 30 g 1   Polyethyl Glycol-Propyl Glycol (SYSTANE) 0.4-0.3 % SOLN Place 1 drop into both eyes at bedtime.     potassium chloride SA (KLOR-CON M20) 20 MEQ tablet Take 1 tablet (20 mEq total) by mouth 2 (two) times daily. Please make yearly appt with Dr. Acie Fredrickson for January 2022 for future refills. Thank you 1st attempt (Patient taking differently: Take 20 mEq by mouth See admin instructions. Take one tablet (20 meq) by mouth twice daily, may take an extra tablet (20 mg) daily as needed for leg cramps.  Please make yearly appt with Dr. Acie Fredrickson for January 2022 for future refills. Thank you 1st attempt) 180 tablet 0   torsemide (DEMADEX) 20 MG tablet Take 2 tablets (40 mg total) by mouth 2 (two) times daily. (Patient taking differently: Take 40 mg by mouth See admin instructions. Take 2 tablets (40 mg) by mouth twice daily - morning and 2pm) 360 tablet 3   valACYclovir (VALTREX) 1000 MG tablet Take 2 tablets (2,000 mg total) by mouth every 12 (twelve) hours as needed (fever blisters/cold sores.). 30 tablet 1   vitamin E 1000 UNIT capsule Take 1,000 Units by mouth at bedtime.      benzonatate (TESSALON) 100 MG capsule Take 1 capsule (100 mg total) by mouth 3 (three) times daily as needed for cough. 20 capsule 0   Cholecalciferol (VITAMIN D3) 125 MCG (5000 UT) TABS Take 5,000 Units by mouth at bedtime.      Cyanocobalamin (B-12) 5000 MCG CAPS Take 5,000 mcg by mouth at bedtime.      metFORMIN (GLUCOPHAGE) 500 MG tablet Take 1 tablet (500 mg total) by mouth 2 (two) times daily with a meal. 28 tablet 1   No  facility-administered medications prior to visit.     EXAM:  BP 112/64 (BP Location: Left Arm, Patient Position: Sitting, Cuff Size: Large)    Pulse 67    Temp 98.9 F (37.2 C) (Oral)    Ht 5' 6.5" (1.689 m)    Wt 274 lb (124.3 kg)    SpO2 94%    BMI 43.56 kg/m   Body mass index is 43.56 kg/m.  GENERAL: vitals reviewed and listed above, alert, oriented, appears well hydrated and in no acute distress HEENT: atraumatic, conjunctiva  clear, no obvious abnormalities on inspection of external nose and ears righ tm nl  Left tm partly seen nl  1+ wax no impacted  OP :masked  NECK: no obvious masses on inspection palpation  LUNGS: clear to auscultation bilaterally, no wheezes, rales or rhonchi, CV: HRRR, no clubbing cyanosis 2+   peripheral edema nl cap refill  MS: moves all extremities without noticeable focal  abnormality PSYCH: pleasant and cooperative, no obvious depression or anxiety Neuro  Mild bilateral hand tremor. Lab Results  Component Value Date   WBC 6.3 06/26/2020   HGB 14.5 06/26/2020   HCT 44.3 06/26/2020   PLT 197 06/26/2020   GLUCOSE 241 (H) 07/07/2020   CHOL 151 04/10/2019   TRIG 160.0 (H) 04/10/2019   HDL 50.80 04/10/2019   [place]  68 04/10/2019   ALT 28 01/28/2020   AST 33 01/28/2020   NA 134 (L) 07/07/2020   K 3.5 07/07/2020   CL 90 (L) 07/07/2020   CREATININE 1.54 (H) 07/07/2020   BUN 39 (H) 07/07/2020   CO2 27 07/07/2020   TSH 2.77 01/28/2020   INR 1.3 (H) 02/17/2020   HGBA1C 8.1 (H) 05/19/2020   MICROALBUR <0.7 01/23/2017   BP Readings from Last 3 Encounters:  07/12/20 112/64  07/07/20 (!) 123/50  06/30/20 129/69   bg review  Record review  ASSESSMENT AND PLAN:  Discussed the following assessment and plan:  Type 2 diabetes mellitus with hyperglycemia, without long-term current use of insulin (HCC)  Tremor - off an on family  hx  readdress at fu is on b blocker .   Skin lesion  Chronic diastolic heart failure (HCC)  Ear symptom  Morbid  obesity ([place]) Discussed options for best control refilled metformin she would be a good candidate for SGLT2 [place] or Jardiance with cardiovascular indications.  Continue metformin samples given of Farxiga 5 mg and also 10 mg she can split in half and prescription sent.  Hopefully your insurance plan will cover these medicines. Plan follow-up in about 2 months or thereabouts.  She can change her future appointment. Ear symptoms does not sound serious at this time since wax is not impacted will not flush out we will see how it persists Skin lesion looks dramatic and she reports it getting larger quickly question could be granuloma or other lesion see dermatology as soon as possible. Tremor could be essential or familial is already on a beta-blocker we will reassess when she comes back her last thyroid test was normal. Chronic diastolic heart failure and arrhythmias apparently stable follow-up cardiology has elevated pulmonary pressures  Record review visit counsel plan order 45 minutes. -Patient advised to return or notify health care team  if  new concerns arise.  Patient Instructions  See dermatology about the lesion on the left arm.  Left ear has some wax .  May add to the sx and can still have congestion  No infection.   Stay on metformin    And add medication.  .  Every day  Important to not be dehydrated on this medicaiton   If cannot get fluids in then hold the medication temporarily .  Continue  Monitoring BG  .   Plan FU  In about 6-8 weeks  Or as needed       Mariann Laster K. Vergia Chea M.D.

## 2020-07-12 ENCOUNTER — Telehealth: Payer: Self-pay | Admitting: Internal Medicine

## 2020-07-12 ENCOUNTER — Other Ambulatory Visit: Payer: Self-pay

## 2020-07-12 ENCOUNTER — Encounter: Payer: Self-pay | Admitting: Internal Medicine

## 2020-07-12 ENCOUNTER — Ambulatory Visit (INDEPENDENT_AMBULATORY_CARE_PROVIDER_SITE_OTHER): Payer: PPO | Admitting: Internal Medicine

## 2020-07-12 VITALS — BP 112/64 | HR 67 | Temp 98.9°F | Ht 66.5 in | Wt 274.0 lb

## 2020-07-12 DIAGNOSIS — R251 Tremor, unspecified: Secondary | ICD-10-CM

## 2020-07-12 DIAGNOSIS — R6889 Other general symptoms and signs: Secondary | ICD-10-CM

## 2020-07-12 DIAGNOSIS — I5032 Chronic diastolic (congestive) heart failure: Secondary | ICD-10-CM

## 2020-07-12 DIAGNOSIS — L989 Disorder of the skin and subcutaneous tissue, unspecified: Secondary | ICD-10-CM | POA: Diagnosis not present

## 2020-07-12 DIAGNOSIS — E1165 Type 2 diabetes mellitus with hyperglycemia: Secondary | ICD-10-CM | POA: Diagnosis not present

## 2020-07-12 MED ORDER — METFORMIN HCL 500 MG PO TABS
500.0000 mg | ORAL_TABLET | Freq: Two times a day (BID) | ORAL | 3 refills | Status: DC
Start: 2020-07-12 — End: 2020-07-12

## 2020-07-12 MED ORDER — METFORMIN HCL 500 MG PO TABS: 500.0000 mg | ORAL_TABLET | Freq: Two times a day (BID) | ORAL | 3 refills | Status: DC

## 2020-07-12 MED ORDER — DAPAGLIFLOZIN PROPANEDIOL 5 MG PO TABS: 5.0000 mg | ORAL_TABLET | Freq: Every day | ORAL | 3 refills | Status: DC

## 2020-07-12 NOTE — Telephone Encounter (Signed)
See message.

## 2020-07-12 NOTE — Telephone Encounter (Signed)
Disappointing that that medicine is not available at this time. I will send a message to our pharmacy team to see what can be done for future In the meantime you can try taking 3 a day of the metformin and after a week go up to 4 a day if you tolerate it.  I can send in other metformin prescription that says for a day.

## 2020-07-12 NOTE — Telephone Encounter (Signed)
Pt is calling in stating that she is not able to afford Rx dapagliflozin propanediol (FARXIGA) 5 MG it is $524.00 a month and pt stated if Dr. [name] would double her metformin that would be okay for her or whatever she would like to do other than the [facility].  Pt stated that she is at the pharmacy now and would like to see if it can be called in while she is there.

## 2020-07-12 NOTE — Patient Instructions (Addendum)
See dermatology about the lesion on the left arm.  Left ear has some wax .  May add to the sx and can still have congestion  No infection.   Stay on metformin    And add medication.  .  Every day  Important to not be dehydrated on this medicaiton   If cannot get fluids in then hold the medication temporarily .  Continue  Monitoring BG  .   Plan FU  In about 6-8 weeks  Or as needed

## 2020-07-14 ENCOUNTER — Telehealth: Payer: Self-pay

## 2020-07-14 DIAGNOSIS — E876 Hypokalemia: Secondary | ICD-10-CM

## 2020-07-14 NOTE — Telephone Encounter (Signed)
-----   Message from Will Meredith Leeds, MD sent at 07/01/2020  1:15 PM EST ----- Stable preop labs. K low, needs 20 meq daily and recheck in 2 weeks.

## 2020-07-14 NOTE — Telephone Encounter (Signed)
Pt advised and will take an extra KCL 20 meq daily until her repeat labs 07/28/20.

## 2020-07-15 DIAGNOSIS — C44629 Squamous cell carcinoma of skin of left upper limb, including shoulder: Secondary | ICD-10-CM | POA: Diagnosis not present

## 2020-07-15 DIAGNOSIS — Z85828 Personal history of other malignant neoplasm of skin: Secondary | ICD-10-CM | POA: Diagnosis not present

## 2020-07-15 DIAGNOSIS — D485 Neoplasm of uncertain behavior of skin: Secondary | ICD-10-CM | POA: Diagnosis not present

## 2020-07-15 NOTE — Telephone Encounter (Signed)
Returning to normal dosing. She will keep follow up blood work week after next.

## 2020-07-16 NOTE — Telephone Encounter (Signed)
Called patient to discuss plan for diabetes medications. Patient is ok with paying $45/month out of pocket for Jardiance, which is preferred by her insurance. Educated on mechanism of action for new medication and recommended for patient to drink lots of water while taking. Patient is aware to stop taper up with metformin at 2 tablets daily. Plan for repeat labs to check kidneys at follow up with Dr. [name] in February.  Will route to PCP to make her aware to send in a prescription for Jardiance 10 mg [facility] to [facility].

## 2020-07-16 NOTE — Telephone Encounter (Signed)
Please contact patient  And  Report that  Jardiance ( similar med ) may be covered by insurance  At [facility] $ [facility] and  We may have samples to start.  We can discuss at upcoming visit   Also

## 2020-07-20 MED ORDER — EMPAGLIFLOZIN 10 MG PO TABS: 10.0000 mg | ORAL_TABLET | Freq: Every day | ORAL | 3 refills | Status: DC

## 2020-07-20 NOTE — Telephone Encounter (Signed)
Thanks for the leg work.   Send in jardiance to local pharmacy  Please notify patient it has been sent

## 2020-07-20 NOTE — Addendum Note (Signed)
Addended byShanon Ace K on: 07/20/2020 05:11 PM   Modules accepted: Orders

## 2020-07-21 ENCOUNTER — Inpatient Hospital Stay: Payer: PPO | Admitting: Internal Medicine

## 2020-07-21 NOTE — Telephone Encounter (Addendum)
Called patient, she had just picked up medication from [facility]

## 2020-07-28 ENCOUNTER — Other Ambulatory Visit: Payer: Self-pay | Admitting: Cardiovascular Disease

## 2020-07-28 ENCOUNTER — Other Ambulatory Visit: Payer: PPO | Admitting: *Deleted

## 2020-07-28 ENCOUNTER — Other Ambulatory Visit: Payer: Self-pay

## 2020-07-28 DIAGNOSIS — E876 Hypokalemia: Secondary | ICD-10-CM | POA: Diagnosis not present

## 2020-07-28 LAB — BASIC METABOLIC PANEL
BUN/Creatinine Ratio: 25 (ref 12–28)
BUN: 37 mg/dL — ABNORMAL HIGH (ref 8–27)
CO2: 33 mmol/L — ABNORMAL HIGH (ref 20–29)
Calcium: 10.6 mg/dL — ABNORMAL HIGH (ref 8.7–10.3)
Chloride: 89 mmol/L — ABNORMAL LOW (ref 96–106)
Creatinine, Ser: 1.47 mg/dL — ABNORMAL HIGH (ref 0.57–1.00)
GFR calc Af Amer: 40 mL/min/{1.73_m2} — ABNORMAL LOW (ref >59)
GFR calc non Af Amer: 35 mL/min/{1.73_m2} — ABNORMAL LOW (ref >59)
Glucose: 240 mg/dL — ABNORMAL HIGH (ref 65–99)
Potassium: 3.4 mmol/L — ABNORMAL LOW (ref 3.5–5.2)
Sodium: 138 mmol/L (ref 134–144)

## 2020-07-29 ENCOUNTER — Other Ambulatory Visit: Payer: Self-pay | Admitting: Internal Medicine

## 2020-07-30 ENCOUNTER — Ambulatory Visit: Payer: PPO | Admitting: Internal Medicine

## 2020-08-02 ENCOUNTER — Other Ambulatory Visit: Payer: Self-pay

## 2020-08-02 ENCOUNTER — Ambulatory Visit (INDEPENDENT_AMBULATORY_CARE_PROVIDER_SITE_OTHER): Payer: PPO | Admitting: Family Medicine

## 2020-08-02 ENCOUNTER — Encounter: Payer: Self-pay | Admitting: Family Medicine

## 2020-08-02 VITALS — BP 102/72 | HR 73 | Temp 98.2°F | Ht 66.5 in | Wt 276.5 lb

## 2020-08-02 DIAGNOSIS — H6122 Impacted cerumen, left ear: Secondary | ICD-10-CM | POA: Diagnosis not present

## 2020-08-02 MED ORDER — NEOMYCIN-POLYMYXIN-HC 1 % OT SOLN: 3.0000 [drp] | Freq: Four times a day (QID) | OTIC | 0 refills | Status: DC

## 2020-08-02 NOTE — Patient Instructions (Signed)
Use prescription ear drop x 1 week, then use wax softening drops (like debrox or sweet oil) daily for second week. If ear still clogged return at that time for retreatment.

## 2020-08-02 NOTE — Progress Notes (Signed)
[name] DOB: Feb 28, 1946 Encounter date: 08/02/2020  This is a 75 y.o. female who presents with Chief Complaint  Patient presents with  . Ear Pain    Patient complains of left ear congestion intermittently x2 months    History of present illness: Can't hear out of left ear. Not sore. Usually it will pop and open back up. Had RSV in November and started on left side of head/ear; progressed and was hospitalized after this. Still fighting with blood pressure after recovering from this. No drainage.   Allergies  Allergen Reactions  . Hydrocodone-Guaifenesin Other (See Comments)    Confusion/ memory loss  . Adhesive [Tape] Rash and Other (See Comments)    Allergic to defibrillation pads.   Current Meds  Medication Sig  . Ascorbic Acid (VITAMIN C) 1000 MG tablet Take 1,000 mg by mouth at bedtime.   [name] atorvastatin (LIPITOR) 40 MG tablet TAKE 1 TABLET BY MOUTH EVERY DAY  . Calcium Carbonate-Vitamin D (CALCIUM 600+D PO) Take 1 tablet by mouth daily.   [name] diltiazem (CARDIZEM LA) 360 MG 24 hr tablet Take 1 tablet (360 mg total) by mouth daily.  [name] dofetilide (TIKOSYN) 250 MCG capsule Take 1 capsule (250 mcg total) by mouth 2 (two) times daily.  . DULoxetine (CYMBALTA) 60 MG capsule TAKE 1 CAPSULE BY MOUTH EVERY DAY (Patient taking differently: Take 60 mg by mouth daily.)  . ELIQUIS 5 MG TABS tablet TAKE 1 TABLET BY MOUTH TWICE A DAY (Patient taking differently: Take 5 mg by mouth 2 (two) times daily.)  . empagliflozin (JARDIANCE) 10 MG TABS tablet Take 1 tablet (10 mg total) by mouth daily before breakfast.  . fluticasone (FLONASE) 50 MCG/ACT nasal spray Place 1 spray into both nostrils at bedtime.  [name] loratadine (CLARITIN) 10 MG tablet TAKE 1 TABLET BY MOUTH EVERY DAY (Patient taking differently: Take 10 mg by mouth daily.)  . Lysine 1000 MG TABS Take 1,000 mg by mouth at bedtime.   . metFORMIN (GLUCOPHAGE) 500 MG tablet Take 1 tablet (500 mg total) by mouth 2 (two) times daily with a meal.  Increase to 3 per day and then 4 per day  . metolazone (ZAROXOLYN) 2.5 MG tablet Take 1 tablet (2.5 mg total) by mouth every 4 days AS NEEDED for swelling (Patient taking differently: Take 2.5 mg by mouth See admin instructions. Take 1 tablet (2.5 mg total) by mouth every 4 days as needed for swelling/overnight weight gain)  . metoprolol tartrate (LOPRESSOR) 50 MG tablet TAKE 1 TABLET BY MOUTH TWICE A DAY  . Multiple Vitamin (MULTIVITAMIN WITH MINERALS) TABS tablet Take 1 tablet by mouth daily. Centrum  . nitroGLYCERIN (NITROSTAT) 0.4 MG SL tablet PLACE 1 TABLET (0.4 MG TOTAL) UNDER THE TONGUE EVERY 5 (FIVE) MINUTES X 3 DOSES AS NEEDED FOR CHEST PAIN.  [name] nystatin-triamcinolone (MYCOLOG II) cream Apply 1 application topically 2 (two) times daily as needed. (Patient taking differently: Apply 1 application topically 2 (two) times daily as needed (rash).)  . Polyethyl Glycol-Propyl Glycol (SYSTANE) 0.4-0.3 % SOLN Place 1 drop into both eyes at bedtime.  . potassium chloride SA (KLOR-CON M20) 20 MEQ tablet Take 1 tablet (20 mEq total) by mouth 2 (two) times daily. May take an extra tablet (20 mg) daily as needed for leg cramps.  . torsemide (DEMADEX) 20 MG tablet TAKE 2 TABLETS (40 MG TOTAL) BY MOUTH 2 (TWO) TIMES DAILY.  . valACYclovir (VALTREX) 1000 MG tablet Take 2 tablets (2,000 mg total) by mouth every 12 (  twelve) hours as needed (fever blisters/cold sores.).  [name] vitamin E 1000 UNIT capsule Take 1,000 Units by mouth at bedtime.     Review of Systems  Constitutional: Negative for chills, fatigue and fever.  HENT: Positive for hearing loss. Negative for congestion, ear discharge, ear pain, postnasal drip, rhinorrhea and sinus pain.   Respiratory: Negative for cough, chest tightness, shortness of breath and wheezing.   Cardiovascular: Negative for chest pain, palpitations and leg swelling.    Objective:  BP 102/72 (BP Location: Left Arm, Patient Position: Sitting, Cuff Size: Large)   Pulse 73    Temp 98.2 F (36.8 C) (Oral)   Ht 5' 6.5" (1.689 m)   Wt 276 lb 8 oz (125.4 kg)   SpO2 93%   BMI 43.96 kg/m   Weight: 276 lb 8 oz (125.4 kg)   BP Readings from Last 3 Encounters:  08/02/20 102/72  07/12/20 112/64  07/07/20 (!) 123/50   Wt Readings from Last 3 Encounters:  08/02/20 276 lb 8 oz (125.4 kg)  07/12/20 274 lb (124.3 kg)  07/07/20 268 lb (121.6 kg)    Physical Exam Constitutional:      Appearance: Normal appearance.  HENT:     Right Ear: Tympanic membrane, ear canal and external ear normal.     Left Ear: External ear normal. There is impacted cerumen.  Neurological:     Mental Status: She is alert.    Cerumen removal: After patient gave verbal consent, I used lighted curette to remove some cerumen in the left ear canal.  Cerumen was very adherent to ear canal, so wax was softened with cottonball and peroxide.  We were able to get out small degree of laxity using lavage and manual debridement with lighted curette.  Patient did tolerate this well, however you reached a point where you are not getting any more wax out and ear canal was getting irritated.   Assessment/Plan 1. Hearing loss due to cerumen impaction, left Small amount of cerumen was able to be removed from left ear, but full removal was not possible today due to significant impaction.  Due to irritation to the ear canal, I did give patient some eardrops to use for the next week and instructed her to follow that up by 1 weeks worth of either Debrox or sweet oil into the ear canal to help loosen up remaining cerumen.  We will have her return in 2 weeks to attempt removal again.  - NEOMYCIN-POLYMYXIN-HYDROCORTISONE (CORTISPORIN) 1 % SOLN OTIC solution; Place 3 drops into the left ear 4 (four) times daily.  Dispense: 10 mL; Refill: 0    Return in 2 weeks (on 08/16/2020), or if symptoms worsen or fail to improve, for return for ear treatment.     Micheline Rough, MD

## 2020-08-03 ENCOUNTER — Ambulatory Visit: Payer: PPO | Admitting: Internal Medicine

## 2020-08-04 ENCOUNTER — Other Ambulatory Visit: Payer: Self-pay

## 2020-08-04 ENCOUNTER — Other Ambulatory Visit (HOSPITAL_COMMUNITY): Payer: Self-pay | Admitting: *Deleted

## 2020-08-04 ENCOUNTER — Ambulatory Visit (HOSPITAL_COMMUNITY)
Admission: RE | Admit: 2020-08-04 | Discharge: 2020-08-04 | Disposition: A | Discharge status: Home / Self Care | Payer: PPO | Source: Ambulatory Visit | Attending: Nurse Practitioner | Admitting: Nurse Practitioner

## 2020-08-04 ENCOUNTER — Encounter (HOSPITAL_COMMUNITY): Payer: Self-pay | Admitting: Nurse Practitioner

## 2020-08-04 VITALS — BP 110/80 | HR 64 | Ht 66.5 in | Wt 266.2 lb

## 2020-08-04 DIAGNOSIS — Z79899 Other long term (current) drug therapy: Secondary | ICD-10-CM | POA: Insufficient documentation

## 2020-08-04 DIAGNOSIS — N189 Chronic kidney disease, unspecified: Secondary | ICD-10-CM | POA: Insufficient documentation

## 2020-08-04 DIAGNOSIS — Z7901 Long term (current) use of anticoagulants: Secondary | ICD-10-CM | POA: Insufficient documentation

## 2020-08-04 DIAGNOSIS — I5032 Chronic diastolic (congestive) heart failure: Secondary | ICD-10-CM | POA: Insufficient documentation

## 2020-08-04 DIAGNOSIS — I4819 Other persistent atrial fibrillation: Secondary | ICD-10-CM | POA: Diagnosis not present

## 2020-08-04 DIAGNOSIS — R609 Edema, unspecified: Secondary | ICD-10-CM | POA: Diagnosis not present

## 2020-08-04 DIAGNOSIS — H6122 Impacted cerumen, left ear: Secondary | ICD-10-CM | POA: Diagnosis not present

## 2020-08-04 DIAGNOSIS — I130 Hypertensive heart and chronic kidney disease with heart failure and stage 1 through stage 4 chronic kidney disease, or unspecified chronic kidney disease: Secondary | ICD-10-CM | POA: Insufficient documentation

## 2020-08-04 DIAGNOSIS — Z87891 Personal history of nicotine dependence: Secondary | ICD-10-CM | POA: Diagnosis not present

## 2020-08-04 DIAGNOSIS — I4892 Unspecified atrial flutter: Secondary | ICD-10-CM | POA: Diagnosis not present

## 2020-08-04 DIAGNOSIS — D6869 Other thrombophilia: Secondary | ICD-10-CM | POA: Diagnosis not present

## 2020-08-04 DIAGNOSIS — Z8249 Family history of ischemic heart disease and other diseases of the circulatory system: Secondary | ICD-10-CM | POA: Insufficient documentation

## 2020-08-04 DIAGNOSIS — R0602 Shortness of breath: Secondary | ICD-10-CM | POA: Diagnosis not present

## 2020-08-04 DIAGNOSIS — H938X1 Other specified disorders of right ear: Secondary | ICD-10-CM | POA: Diagnosis not present

## 2020-08-04 LAB — BASIC METABOLIC PANEL
Anion gap: 16 — ABNORMAL HIGH (ref 5–15)
BUN: 25 mg/dL — ABNORMAL HIGH (ref 8–23)
CO2: 30 mmol/L (ref 22–32)
Calcium: 9.6 mg/dL (ref 8.9–10.3)
Chloride: 91 mmol/L — ABNORMAL LOW (ref 98–111)
Creatinine, Ser: 1.44 mg/dL — ABNORMAL HIGH (ref 0.44–1.00)
GFR, Estimated: 38 mL/min — ABNORMAL LOW (ref >60)
Glucose, Bld: 158 mg/dL — ABNORMAL HIGH (ref 70–99)
Potassium: 3.3 mmol/L — ABNORMAL LOW (ref 3.5–5.1)
Sodium: 137 mmol/L (ref 135–145)

## 2020-08-04 LAB — MAGNESIUM: Magnesium: 2.2 mg/dL (ref 1.7–2.4)

## 2020-08-04 MED ORDER — POTASSIUM CHLORIDE CRYS ER 20 MEQ PO TBCR: 40.0000 meq | EXTENDED_RELEASE_TABLET | Freq: Two times a day (BID) | ORAL | 2 refills | Status: DC

## 2020-08-04 NOTE — Progress Notes (Signed)
Primary Care Physician: [name], MD Referring Physician:[name] is a 75 y.o. female with a h/o PPM, chronic diastolic HF,  paroxysmal  Afib/flutter  that is in the afib clinic for persistent  afib for the last 2 weeks. She  feels more shortness of breath and has noted increase in pedal edema. She has had 3 prior ablations, most recent 03/14/2019.  She had had both covid shots and has not missed any anticoagulation with a CHA2DS2VASc of 3.   F/u in afib clinic, 01/30/20, she had a successful CV, 7/22. Unfortunately had ERAF. She has few choices going forward.. She had tikosyn in the past but it was stopped when she had an infection several years  ago, the pt thinks it was to treat pnumonia . She failed amio after [employer]. She had had 3 ablations and several cardioversion's. I discussed with Dr. Curt Bears. He is not in favor of repeat ablation, he suggested reloading of tikosyn. AV nodal ablation is a possibility as she already has a PPM, but he prefer this to be her last option. Her qt is at 471 ms today and she is on Prozac. She  will contact her PCP to see if she can be switched to Cymbalta, from Prozac,  preferred antidepressant with tikosyn. She is using benadryl now and this will need to be stopped.   F/u in afib clinic 02/27/20, as she now one week s/p Tikosyn loading. She underwent cardioversion after Tikosyn load and left the hospital in [place]. She reports that she feels improved. EKG shows Sinus rhythm with stable qt.   F/u in afib clinic 08/04/20. Pt is now s/p afib 4th ablation performed by Dr. Curt Bears 07/07/20. She had a prolonged hospitalization in November for RSV/ CHF in the setting of afib with RVR. Ekg shows SR. She remains on dofetilide 250 mcg bid. She is feeling improved. Denies  any swallowing or groin issues.   Today, she denies symptoms of palpitations, chest pain, shortness of breath, orthopnea, PND, lower extremity edema, dizziness, presyncope, syncope, or  neurologic sequela. The patient is tolerating medications without difficulties and is otherwise without complaint today.   Past Medical History:  Diagnosis Date  . Anticoagulant long-term use    pradaxa  . Anxiety   . Arthritis    "fingers, lower back" (04/23/2017   . CAD (coronary artery disease) 1607,3710   post PTCA with bare-metal stenting to mid RCA in December 2004     . CHF (congestive heart failure) ([place])   . Chronic atrial fibrillation ([place]) 06/2007   Tachybradycardia pacemaker  . Chronic kidney disease    10% function - ?R, other kidney is compensating    . CVA (cerebral vascular accident) [facility]) [phone]   denies residual on 04/23/2017  . Depression   . Diplopia 06/19/2008   Qualifier: Diagnosis of  By: [name] MD, [name]   . Dysrhythmia    ATRIAL FIBRILATION  . Edema of lower extremity   . Hyperlipidemia   . Hypertension   . Inferior myocardial infarction [facility])    acute inferior wall mi/other medical hx  . Myocardial infarction ([place]) [id number]  . Obesity   . OSA on CPAP    last test- 2010  . Pacemaker   . Pneumonia 2014   tx. ----  [facility]  . Pulmonary hypertension ([place])    moderate pulmonary hypertension by 10/2016 echo and 10/2013 cardiac cath  . Shortness of breath   . Skin cancer    "  cut off right [place]; burned off LLE" (04/23/2017)  . Sleep apnea   . Spondylolisthesis   . TIA (transient ischemic attack) 2008  . Unspecified hemorrhoids without mention of complication 4/94/4967   Colonoscopy--Dr. Carlean Purl    Past Surgical History:  Procedure Laterality Date  . ABDOMINAL HYSTERECTOMY    . APPENDECTOMY  1984  . ATRIAL FIBRILLATION ABLATION N/A 09/29/2016   Procedure: Atrial Fibrillation Ablation;  Surgeon: Will Meredith Leeds, MD;  Location: [place] CV LAB;  Service: Cardiovascular;  Laterality: N/A;  . ATRIAL FIBRILLATION ABLATION N/A 02/07/2018   Procedure: ATRIAL FIBRILLATION ABLATION;  Surgeon: Constance Haw, MD;  Location: [place] CV LAB;  Service: Cardiovascular;  Laterality: N/A;  . ATRIAL FIBRILLATION ABLATION N/A 03/13/2019   Procedure: ATRIAL FIBRILLATION ABLATION;  Surgeon: Constance Haw, MD;  Location: [place] CV LAB;  Service: Cardiovascular;  Laterality: N/A;  . ATRIAL FIBRILLATION ABLATION N/A 07/07/2020   Procedure: ATRIAL FIBRILLATION ABLATION;  Surgeon: Constance Haw, MD;  Location: [place] CV LAB;  Service: Cardiovascular;  Laterality: N/A;  . BACK SURGERY    . CARDIOVERSION N/A 09/12/2017   Procedure: CARDIOVERSION;  Surgeon: Jerline Pain, MD;  Location: [facility] ENDOSCOPY;  Service: Cardiovascular;  Laterality: N/A;  . CARDIOVERSION N/A 12/13/2017   Procedure: CARDIOVERSION;  Surgeon: Sanda Klein, MD;  Location: [place] ENDOSCOPY;  Service: Cardiovascular;  Laterality: N/A;  . CARDIOVERSION N/A 02/18/2018   Procedure: CARDIOVERSION;  Surgeon: Dorothy Spark, MD;  Location: [facility] ENDOSCOPY;  Service: Cardiovascular;  Laterality: N/A;  . CARDIOVERSION N/A 05/20/2018   Procedure: CARDIOVERSION;  Surgeon: [name], MD;  Location: [facility] ENDOSCOPY;  Service: Cardiovascular;  Laterality: N/A;  . CARDIOVERSION N/A 04/16/2019   Procedure: CARDIOVERSION;  Surgeon: Donato Heinz, MD;  Location: [place];  Service: Endoscopy;  Laterality: N/A;  . CARDIOVERSION N/A 01/22/2020   Procedure: CARDIOVERSION;  Surgeon: Geralynn Rile, MD;  Location: [place];  Service: Cardiovascular;  Laterality: N/A;  . CARDIOVERSION N/A 02/18/2020   Procedure: CARDIOVERSION;  Surgeon: Elouise Munroe, MD;  Location: [facility] ENDOSCOPY;  Service: Cardiovascular;  Laterality: N/A;  . CATARACT EXTRACTION W/ INTRAOCULAR LENS  IMPLANT, BILATERAL Bilateral 01/15/2017- 03/2017  . CHOLECYSTECTOMY    . CORONARY ANGIOPLASTY  X 2  . CORONARY ANGIOPLASTY WITH STENT PLACEMENT  1998; ~ 2007; ?date   "1 stent; replaced stent; not sure when I got the last stent" (04/23/2017)  . DOPPLER ECHOCARDIOGRAPHY   2009  . ELECTROPHYSIOLOGIC STUDY N/A 03/31/2016   Procedure: Cardioversion;  Surgeon: Evans Lance, MD;  Location: [place] CV LAB;  Service: Cardiovascular;  Laterality: N/A;  . ELECTROPHYSIOLOGIC STUDY N/A 08/04/2016   Procedure: Cardioversion;  Surgeon: Evans Lance, MD;  Location: [place] CV LAB;  Service: Cardiovascular;  Laterality: N/A;  . INSERT / REPLACE / REMOVE PACEMAKER  06/2007  . IR RADIOLOGY PERIPHERAL GUIDED IV START  01/31/2018  . IR US GUIDE VASC ACCESS LEFT  01/31/2018  . JOINT REPLACEMENT    . LAPAROSCOPIC CHOLECYSTECTOMY  1994  . LEFT AND RIGHT HEART CATHETERIZATION WITH CORONARY ANGIOGRAM N/A 10/06/2013   Procedure: LEFT AND RIGHT HEART CATHETERIZATION WITH CORONARY ANGIOGRAM;  Surgeon: Troy Sine, MD;  Location[facility] CATH LAB;  Service: Cardiovascular;  Laterality: N/A;  . LEFT OOPHORECTOMY Left ~ 1989  . POSTERIOR LUMBAR FUSION  2000s - 04/2015 X 3   L3-4; L4-5; L2-3; Dr Trenton Gammon  . RIGHT/LEFT HEART CATH AND CORONARY ANGIOGRAPHY N/A 05/09/2017   Procedure: RIGHT/LEFT HEART  CATH AND CORONARY ANGIOGRAPHY;  Surgeon: Sherren Mocha, MD;  Location: [place] CV LAB;  Service: Cardiovascular;  Laterality: N/A;  . SKIN CANCER EXCISION Right    [place]  . TEE WITHOUT CARDIOVERSION N/A 09/29/2016   Procedure: TRANSESOPHAGEAL ECHOCARDIOGRAM (TEE);  Surgeon: Jerline Pain, MD;  Location: [place];  Service: Cardiovascular;  Laterality: N/A;  . TOTAL ABDOMINAL HYSTERECTOMY  1984   "uterus & right ovary"  . TOTAL KNEE ARTHROPLASTY Left 04/23/2017  . TOTAL KNEE ARTHROPLASTY Left 04/23/2017   Procedure: TOTAL KNEE ARTHROPLASTY;  Surgeon: Frederik Pear, MD;  Location: [place];  Service: Orthopedics;  Laterality: Left;  . ULTRASOUND GUIDANCE FOR VASCULAR ACCESS  05/09/2017   Procedure: Ultrasound Guidance For Vascular Access;  Surgeon: Sherren Mocha, MD;  Location: [place] CV LAB;  Service: Cardiovascular;;    Current Outpatient Medications  Medication Sig Dispense Refill   . Ascorbic Acid (VITAMIN C) 1000 MG tablet Take 1,000 mg by mouth at bedtime.     [name] atorvastatin (LIPITOR) 40 MG tablet TAKE 1 TABLET BY MOUTH EVERY DAY 90 tablet 3  . Calcium Carbonate-Vitamin D (CALCIUM 600+D PO) Take 1 tablet by mouth daily.     [name] diltiazem (CARDIZEM LA) 360 MG 24 hr tablet Take 1 tablet (360 mg total) by mouth daily. 90 tablet 3  . dofetilide (TIKOSYN) 250 MCG capsule Take 1 capsule (250 mcg total) by mouth 2 (two) times daily. 180 capsule 2  . DULoxetine (CYMBALTA) 60 MG capsule TAKE 1 CAPSULE BY MOUTH EVERY DAY (Patient taking differently: Take 60 mg by mouth daily.) 90 capsule 1  . ELIQUIS 5 MG TABS tablet TAKE 1 TABLET BY MOUTH TWICE A DAY (Patient taking differently: Take 5 mg by mouth 2 (two) times daily.) 180 tablet 2  . empagliflozin (JARDIANCE) 10 MG TABS tablet Take 1 tablet (10 mg total) by mouth daily before breakfast. 30 tablet 3  . fluticasone (FLONASE) 50 MCG/ACT nasal spray Place 1 spray into both nostrils at bedtime.    [name] loratadine (CLARITIN) 10 MG tablet TAKE 1 TABLET BY MOUTH EVERY DAY (Patient taking differently: Take 10 mg by mouth daily.) 90 tablet 1  . Lysine 1000 MG TABS Take 1,000 mg by mouth at bedtime.     . metFORMIN (GLUCOPHAGE) 500 MG tablet Take 1 tablet (500 mg total) by mouth 2 (two) times daily with a meal. Increase to 3 per day and then 4 per day 6120 tablet 3  . metolazone (ZAROXOLYN) 2.5 MG tablet Take 1 tablet (2.5 mg total) by mouth every 4 days AS NEEDED for swelling (Patient taking differently: Take 2.5 mg by mouth See admin instructions. Take 1 tablet (2.5 mg total) by mouth every 4 days as needed for swelling/overnight weight gain) 7 tablet 2  . metoprolol tartrate (LOPRESSOR) 50 MG tablet TAKE 1 TABLET BY MOUTH TWICE A DAY 60 tablet 1  . Multiple Vitamin (MULTIVITAMIN WITH MINERALS) TABS tablet Take 1 tablet by mouth daily. Centrum    . NEOMYCIN-POLYMYXIN-HYDROCORTISONE (CORTISPORIN) 1 % SOLN OTIC solution Place 3 drops into the left  ear 4 (four) times daily. 10 mL 0  . nitroGLYCERIN (NITROSTAT) 0.4 MG SL tablet PLACE 1 TABLET (0.4 MG TOTAL) UNDER THE TONGUE EVERY 5 (FIVE) MINUTES X 3 DOSES AS NEEDED FOR CHEST PAIN. 75 tablet 1  . nystatin-triamcinolone (MYCOLOG II) cream Apply 1 application topically 2 (two) times daily as needed. (Patient taking differently: Apply 1 application topically 2 (two) times daily as needed (rash).) 30 g 1  .  Polyethyl Glycol-Propyl Glycol (SYSTANE) 0.4-0.3 % SOLN Place 1 drop into both eyes at bedtime.    . potassium chloride SA (KLOR-CON M20) 20 MEQ tablet Take 1 tablet (20 mEq total) by mouth 2 (two) times daily. May take an extra tablet (20 mg) daily as needed for leg cramps. 120 tablet 2  . torsemide (DEMADEX) 20 MG tablet TAKE 2 TABLETS (40 MG TOTAL) BY MOUTH 2 (TWO) TIMES DAILY. 360 tablet 3  . valACYclovir (VALTREX) 1000 MG tablet Take 2 tablets (2,000 mg total) by mouth every 12 (twelve) hours as needed (fever blisters/cold sores.). 30 tablet 1  . vitamin E 1000 UNIT capsule Take 1,000 Units by mouth at bedtime.      No current facility-administered medications for this encounter.    Allergies  Allergen Reactions  . Hydrocodone-Guaifenesin Other (See Comments)    Confusion/ memory loss  . Adhesive [Tape] Rash and Other (See Comments)    Allergic to defibrillation pads.  . Pedi-Pre Tape Spray [Wound Dressing Adhesive] Other (See Comments) and Rash    Allergic to defibrillation pads.    Social History   Socioeconomic History  . Marital status: Married    Spouse name: Not on file  . Number of children: 0  . Years of education: HS  . Highest education level: Not on file  Occupational History  . Occupation: retired    Comment: previously worked [employer]  . Smoking status: Former Smoker    Packs/day: 1.00    Years: 5.00    Pack years: 5.00    Types: Cigarettes    Start date: 05/11/1978    Quit date: 07/03/1982    Years since quitting: 38.1  . Smokeless  tobacco: Never Used  Vaping Use  . Vaping Use: Never used  Substance and Sexual Activity  . Alcohol use: Yes    Comment: 04/23/2017 "once q 6 months; glass of wine"  . Drug use: No  . Sexual activity: Not Currently  Other Topics Concern  . 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   Married    Originally from [place] of two, high school education   Former smoker Pension scheme manager dogs 7    Retired from [employer] 2    Social Determinants of Radio broadcast assistant Strain: Not on [employer] Insecurity: Not on file  Transportation Needs: No Transportation Needs  . Lack of Transportation (Medical): No  . Lack of Transportation (Non-Medical): No  Physical Activity: Not on file  Stress: Not on file  Social Connections: Not on file  Intimate Partner Violence: Not on file    Family History  Problem Relation Age of Onset  . Suicidality Father        suicide death pt was 3 yrs, 63  . Arrhythmia Mother   . Hypertension Mother   . Diabetes Mother   . Dementia Mother   . Heart attack Brother   . Heart disease Paternal Aunt   . Prostate cancer Maternal Grandfather   . Diabetes Paternal Grandfather        fathers side of the family  . Colon cancer Maternal Aunt     ROS- All systems are reviewed and negative except as per the HPI above  Physical Exam: Vitals:   08/04/20 1058  Height: 5' 6.5" (1.689 m)   Wt Readings from  Last 3 Encounters:  08/02/20 125.4 kg  07/12/20 124.3 kg  07/07/20 121.6 kg    Labs: Lab Results  Component Value Date   NA 138 07/28/2020   K 3.4 (L) 07/28/2020   CL 89 (L) 07/28/2020   CO2 33 (H) 07/28/2020   GLUCOSE 240 (H) 07/28/2020   BUN 37 (H) 07/28/2020   CREATININE 1.47 (H) 07/28/2020   CALCIUM 10.6 (H) 07/28/2020   PHOS 4.0 06/01/2018   MG 2.1 05/31/2020   Lab Results  Component Value Date   INR 1.3 (H) 02/17/2020    Lab Results  Component Value Date   CHOL 151 04/10/2019   HDL 50.80 04/10/2019   LDLCALC 68 04/10/2019   TRIG 160.0 (H) 04/10/2019     GEN- The patient is well appearing, alert and oriented x 3 today.   Head- normocephalic, atraumatic Eyes-  Sclera clear, conjunctiva pink Ears- hearing intact Oropharynx- clear Neck- supple, no JVP Lymph- no cervical lymphadenopathy Lungs- Clear to ausculation bilaterally, normal work of breathing Heart- regular rate and rhythm, no murmurs, rubs or gallops, PMI not laterally displaced GI- soft, NT, ND, + BS Extremities- no clubbing, cyanosis, or 1+ edema  MS- no significant deformity or atrophy Skin- no rash or lesion Psych- euthymic mood, full affect Neuro- strength and sensation are intact  EKG- NSR with first degree block  Vent. rate 64 BPM PR interval 242 ms QRS duration 88 ms QT/QTc 448/462 ms   Assessment and Plan: 1. Persistent  atrial flutter   4th  afib ablation one month ago Appears to be staying in [place] at 250 mcg bid Continue lopressor 50 mg bid  Continue Cardizem 360 mg daily   Feels improved  Bmet today   2. CHA2DS2VASc score of 3 Continue eliquis 5 mg bid   3. Diastolic HF Daily weights Torsemide  40 mg bid  Takes metolazone as needed if retains weight   F/u with Dr. Curt Bears  10/07/20  Geroge Baseman. Jackson Coffield, [facility] [address] [place], [place] [address] [phone]

## 2020-08-13 ENCOUNTER — Other Ambulatory Visit: Payer: Self-pay

## 2020-08-13 ENCOUNTER — Encounter: Payer: PPO | Admitting: Cardiology

## 2020-08-13 ENCOUNTER — Ambulatory Visit (HOSPITAL_COMMUNITY)
Admission: RE | Admit: 2020-08-13 | Discharge: 2020-08-13 | Disposition: A | Discharge status: Home / Self Care | Payer: PPO | Source: Ambulatory Visit | Attending: Nurse Practitioner | Admitting: Nurse Practitioner

## 2020-08-13 DIAGNOSIS — D6869 Other thrombophilia: Secondary | ICD-10-CM | POA: Diagnosis not present

## 2020-08-13 DIAGNOSIS — I4819 Other persistent atrial fibrillation: Secondary | ICD-10-CM | POA: Insufficient documentation

## 2020-08-13 LAB — BASIC METABOLIC PANEL
Anion gap: 16 — ABNORMAL HIGH (ref 5–15)
BUN: 28 mg/dL — ABNORMAL HIGH (ref 8–23)
CO2: 25 mmol/L (ref 22–32)
Calcium: 9.9 mg/dL (ref 8.9–10.3)
Chloride: 99 mmol/L (ref 98–111)
Creatinine, Ser: 1.25 mg/dL — ABNORMAL HIGH (ref 0.44–1.00)
GFR, Estimated: 45 mL/min — ABNORMAL LOW (ref >60)
Glucose, Bld: 247 mg/dL — ABNORMAL HIGH (ref 70–99)
Potassium: 4 mmol/L (ref 3.5–5.1)
Sodium: 140 mmol/L (ref 135–145)

## 2020-08-16 ENCOUNTER — Ambulatory Visit: Payer: PPO | Admitting: Internal Medicine

## 2020-08-18 DIAGNOSIS — G4733 Obstructive sleep apnea (adult) (pediatric): Secondary | ICD-10-CM | POA: Diagnosis not present

## 2020-08-22 NOTE — Progress Notes (Signed)
Chief Complaint  Patient presents with  . Follow-up  . Medication Management  . Diabetes    HPI: [name] 75 y.o. come in for Chronic disease management  Follow-up after beginning Jardiance 10 mg a day feels no significant difference feels that her blood sugars are slowly coming down. 169 this am in the past week or so in the 1 7180 range.  The lows. Indigestion at times to take Pepcid for a while if needed Tremor is about the same or slightly worse  Asks about covid 4th travel to family  hasnt seen in 1.5 years  A fib stable at this time. Has a follow-up with pulmonary soon. ROS: See pertinent positives and negatives per HPI.  Past Medical History:  Diagnosis Date  . Anticoagulant long-term use    pradaxa  . Anxiety   . Arthritis    "fingers, lower back" (04/23/2017   . CAD (coronary artery disease) 4259,5638   post PTCA with bare-metal stenting to mid RCA in December 2004     . CHF (congestive heart failure) ([place])   . Chronic atrial fibrillation ([place]) 06/2007   Tachybradycardia pacemaker  . Chronic kidney disease    10% function - ?R, other kidney is compensating    . CVA (cerebral vascular accident) [facility]) [phone]   denies residual on 04/23/2017  . Depression   . Diplopia 06/19/2008   Qualifier: Diagnosis of  By: [name] MD, [name]   . Dysrhythmia    ATRIAL FIBRILATION  . Edema of lower extremity   . Hyperlipidemia   . Hypertension   . Inferior myocardial infarction [facility])    acute inferior wall mi/other medical hx  . Myocardial infarction ([place]) [id number]  . Obesity   . OSA on CPAP    last test- 2010  . Pacemaker   . Pneumonia 2014   tx. ----  [facility]  . Pulmonary hypertension ([place])    moderate pulmonary hypertension by 10/2016 echo and 10/2013 cardiac cath  . Shortness of breath   . Skin cancer    "cut off right Jallow; burned off LLE" (04/23/2017)  . Sleep apnea   . Spondylolisthesis   . TIA (transient ischemic attack) 2008  . Unspecified  hemorrhoids without mention of complication 11/18/8414   Colonoscopy--Dr. Carlean Purl     Family History  Problem Relation Age of Onset  . Suicidality Father        suicide death pt was 3 yrs, 2  . Arrhythmia Mother   . Hypertension Mother   . Diabetes Mother   . Dementia Mother   . Heart attack Brother   . Heart disease Paternal Aunt   . Prostate cancer Maternal Grandfather   . Diabetes Paternal Grandfather        fathers side of the family  . Colon cancer Maternal Aunt     Social History   Socioeconomic History  . Marital status: Married    Spouse name: Not on file  . Number of children: 0  . Years of education: HS  . Highest education level: Not on file  Occupational History  . Occupation: retired    Comment: previously worked [employer]  . Smoking status: Former Smoker    Packs/day: 1.00    Years: 5.00    Pack years: 5.00    Types: Cigarettes    Start date: 05/11/1978    Quit date: 07/03/1982    Years since quitting: 38.1  . Smokeless tobacco: Never Used  Vaping Use  . Vaping Use: Never used  Substance and Sexual Activity  . Alcohol use: Yes    Comment: 04/23/2017 "once q 6 months; glass of wine"  . Drug use: No  . Sexual activity: Not Currently  Other Topics Concern  . 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   Married    Originally from [place] [place] of two, high school education   Former smoker Pension scheme manager dogs 7    Retired from [employer]    Social Determinants of Radio broadcast assistant Strain: Not on [employer] Insecurity: Not on file  Transportation Needs: No Transportation Needs  . Lack of Transportation (Medical): No  . Lack of Transportation (Non-Medical): No  Physical Activity: Not on file  Stress: Not on file  Social Connections: Not on file    Outpatient Medications Prior to Visit  Medication Sig Dispense  Refill  . Ascorbic Acid (VITAMIN C) 1000 MG tablet Take 1,000 mg by mouth at bedtime.     [name] atorvastatin (LIPITOR) 40 MG tablet TAKE 1 TABLET BY MOUTH EVERY DAY 90 tablet 3  . Calcium Carbonate-Vitamin D (CALCIUM 600+D PO) Take 1 tablet by mouth daily.     [name] diltiazem (CARDIZEM LA) 360 MG 24 hr tablet Take 1 tablet (360 mg total) by mouth daily. 90 tablet 3  . dofetilide (TIKOSYN) 250 MCG capsule Take 1 capsule (250 mcg total) by mouth 2 (two) times daily. 180 capsule 2  . DULoxetine (CYMBALTA) 60 MG capsule TAKE 1 CAPSULE BY MOUTH EVERY DAY (Patient taking differently: Take 60 mg by mouth daily.) 90 capsule 1  . ELIQUIS 5 MG TABS tablet TAKE 1 TABLET BY MOUTH TWICE A DAY (Patient taking differently: Take 5 mg by mouth 2 (two) times daily.) 180 tablet 2  . fluticasone (FLONASE) 50 MCG/ACT nasal spray Place 1 spray into both nostrils at bedtime.    [name] loratadine (CLARITIN) 10 MG tablet TAKE 1 TABLET BY MOUTH EVERY DAY (Patient taking differently: Take 10 mg by mouth daily.) 90 tablet 1  . Lysine 1000 MG TABS Take 1,000 mg by mouth at bedtime.     . metFORMIN (GLUCOPHAGE) 500 MG tablet Take 1 tablet (500 mg total) by mouth 2 (two) times daily with a meal. Increase to 3 per day and then 4 per day 6120 tablet 3  . metolazone (ZAROXOLYN) 2.5 MG tablet Take 1 tablet (2.5 mg total) by mouth every 4 days AS NEEDED for swelling (Patient taking differently: Take 2.5 mg by mouth See admin instructions. Take 1 tablet (2.5 mg total) by mouth every 4 days as needed for swelling/overnight weight gain) 7 tablet 2  . metoprolol tartrate (LOPRESSOR) 50 MG tablet TAKE 1 TABLET BY MOUTH TWICE A DAY 60 tablet 1  . Multiple Vitamin (MULTIVITAMIN WITH MINERALS) TABS tablet Take 1 tablet by mouth daily. Centrum    . nitroGLYCERIN (NITROSTAT) 0.4 MG SL tablet PLACE 1 TABLET (0.4 MG TOTAL) UNDER THE TONGUE EVERY 5 (FIVE) MINUTES X 3 DOSES AS NEEDED FOR CHEST PAIN. 75 tablet 1  . nystatin-triamcinolone (MYCOLOG II) cream Apply  1 application topically 2 (two) times daily as needed. (Patient taking differently: Apply 1 application topically 2 (two) times daily as needed (rash).) 30 g 1  . Polyethyl Glycol-Propyl Glycol (SYSTANE) 0.4-0.3 % SOLN Place 1 drop into both eyes at  bedtime.    . potassium chloride SA (KLOR-CON M20) 20 MEQ tablet Take 2 tablets (40 mEq total) by mouth 2 (two) times daily. May take an extra tablet (20 mg) daily as needed for leg cramps. 120 tablet 2  . torsemide (DEMADEX) 20 MG tablet TAKE 2 TABLETS (40 MG TOTAL) BY MOUTH 2 (TWO) TIMES DAILY. 360 tablet 3  . valACYclovir (VALTREX) 1000 MG tablet Take 2 tablets (2,000 mg total) by mouth every 12 (twelve) hours as needed (fever blisters/cold sores.). 30 tablet 1  . vitamin E 1000 UNIT capsule Take 1,000 Units by mouth at bedtime.     . empagliflozin (JARDIANCE) 10 MG TABS tablet Take 1 tablet (10 mg total) by mouth daily before breakfast. 30 tablet 3   No facility-administered medications prior to visit.     EXAM:  BP 110/76 (BP Location: Left Wrist, Patient Position: Sitting, Cuff Size: Large)   Pulse 69   Temp 97.9 F (36.6 C) (Oral)   Wt 268 lb (121.6 kg)   SpO2 96%   BMI 42.61 kg/m   Body mass index is 42.61 kg/m.  GENERAL: vitals reviewed and listed above, alert, oriented, appears well hydrated and in no acute distress HEENT: atraumatic, conjunctiva  clear, no obvious abnormalities on inspection of external nose and ears OP : Masked NECK: no obvious masses on inspection palpation  No respiratory distress color looks normal. CV: HRRR, no clubbing cyanosis or nl cap refill  MS: moves all extremities without noticeable focal  abnormality very fine almost on notable tremor intentional ambulatory without assistance today PSYCH: pleasant and cooperative, no obvious depression or anxiety Lab Results  Component Value Date   WBC 6.3 06/26/2020   HGB 14.5 06/26/2020   HCT 44.3 06/26/2020   PLT 197 06/26/2020   GLUCOSE 247 (H)  08/13/2020   CHOL 151 04/10/2019   TRIG 160.0 (H) 04/10/2019   HDL 50.80 04/10/2019   LDLCALC 68 04/10/2019   ALT 28 01/28/2020   AST 33 01/28/2020   NA 140 08/13/2020   K 4.0 08/13/2020   CL 99 08/13/2020   CREATININE 1.25 (H) 08/13/2020   BUN 28 (H) 08/13/2020   CO2 25 08/13/2020   TSH 2.77 01/28/2020   INR 1.3 (H) 02/17/2020   HGBA1C 8.1 (H) 05/19/2020   MICROALBUR <0.7 01/23/2017   BP Readings from Last 3 Encounters:  08/23/20 110/76  08/04/20 110/80  08/02/20 102/72   Wt Readings from Last 3 Encounters:  08/23/20 268 lb (121.6 kg)  08/04/20 266 lb 3.2 oz (120.7 kg)  08/02/20 276 lb 8 oz (125.4 kg)   Lab review GFR stable  ASSESSMENT AND PLAN:  Discussed the following assessment and plan:  Type 2 diabetes mellitus with hyperglycemia, without long-term current use of insulin (HCC)  Tremor  Medication management  Morbid obesity ([place]) No untoward side effects of the Jardiance increase to 25 mg a day consider adding other medicines options.  Close op follow-up is indicated.  she is on maximal Metformin cautioned if she gets vomiting diarrhea dehydration to back off on these medicines at that time. Can check blood sugar before her evening meal occasionally and occasional 2-hour postprandial to see peaks. We reviewed other dietary changes that she is aware of she can consider nutrition referral at some time.  We talked about visiting her family that she has not seen in quite a while because of the Covid pandemic and risk unknown if the fourth injection would be helpful or not but careful isolation  and testing by her family before she comes would be helpful and plus minus in 95 mask or other options.  Travel would be best under the situation when the omicron surges over in her family's community. Plan a1c at fu     Consider other interventions as indicated follow-up in 6 to 8 weeks again. -Patient advised to return or notify health care team  if  new concerns  arise. Record review counsel plan follow-up 30 minutes Patient Instructions  Continue  Am bg monitoring .   And then ocass 2 hours after eating Peak and or  Pre dinner meal.   Increase the jardiance 25 mg per day   consider referral for  Dietician nutrition We can add  More medication again . If needed.   Plan rov  In  6-8 weeks or as needed        [name]. Daveyon Kitchings M.D.

## 2020-08-23 ENCOUNTER — Other Ambulatory Visit: Payer: Self-pay

## 2020-08-23 ENCOUNTER — Ambulatory Visit (INDEPENDENT_AMBULATORY_CARE_PROVIDER_SITE_OTHER): Payer: PPO | Admitting: Internal Medicine

## 2020-08-23 ENCOUNTER — Other Ambulatory Visit: Payer: Self-pay | Admitting: Internal Medicine

## 2020-08-23 ENCOUNTER — Encounter: Payer: Self-pay | Admitting: Internal Medicine

## 2020-08-23 VITALS — BP 110/76 | HR 69 | Temp 97.9°F | Wt 268.0 lb

## 2020-08-23 DIAGNOSIS — R251 Tremor, unspecified: Secondary | ICD-10-CM

## 2020-08-23 DIAGNOSIS — Z79899 Other long term (current) drug therapy: Secondary | ICD-10-CM | POA: Diagnosis not present

## 2020-08-23 DIAGNOSIS — E1165 Type 2 diabetes mellitus with hyperglycemia: Secondary | ICD-10-CM | POA: Diagnosis not present

## 2020-08-23 MED ORDER — EMPAGLIFLOZIN 25 MG PO TABS: 25.0000 mg | ORAL_TABLET | Freq: Every day | ORAL | 3 refills | Status: DC

## 2020-08-23 MED ORDER — METFORMIN HCL 500 MG PO TABS: 1000.0000 mg | ORAL_TABLET | Freq: Two times a day (BID) | ORAL | 3 refills | Status: DC

## 2020-08-23 NOTE — Patient Instructions (Addendum)
Continue  Am bg monitoring .   And then ocass 2 hours after eating Peak and or  Pre dinner meal.   Increase the jardiance 25 mg per day   consider referral for  Dietician nutrition We can add  More medication again . If needed.   Plan rov  In  6-8 weeks or as needed

## 2020-08-24 ENCOUNTER — Other Ambulatory Visit: Payer: Self-pay | Admitting: Internal Medicine

## 2020-09-04 ENCOUNTER — Other Ambulatory Visit: Payer: Self-pay | Admitting: Internal Medicine

## 2020-09-04 ENCOUNTER — Other Ambulatory Visit: Payer: Self-pay | Admitting: Cardiovascular Disease

## 2020-09-08 ENCOUNTER — Other Ambulatory Visit: Payer: Self-pay

## 2020-09-08 NOTE — Progress Notes (Signed)
Received pharmacy request for metformin 500mg  will send correct dispensing amount to pharmacy

## 2020-09-21 ENCOUNTER — Encounter: Payer: Self-pay | Admitting: Internal Medicine

## 2020-09-21 ENCOUNTER — Ambulatory Visit: Payer: PPO | Admitting: Internal Medicine

## 2020-09-21 ENCOUNTER — Other Ambulatory Visit: Payer: Self-pay

## 2020-09-21 VITALS — BP 128/62 | HR 68 | Temp 97.9°F | Ht 66.0 in | Wt 266.0 lb

## 2020-09-21 DIAGNOSIS — Z8619 Personal history of other infectious and parasitic diseases: Secondary | ICD-10-CM

## 2020-09-21 DIAGNOSIS — J129 Viral pneumonia, unspecified: Secondary | ICD-10-CM

## 2020-09-21 DIAGNOSIS — R0600 Dyspnea, unspecified: Secondary | ICD-10-CM

## 2020-09-21 DIAGNOSIS — Z8709 Personal history of other diseases of the respiratory system: Secondary | ICD-10-CM

## 2020-09-21 DIAGNOSIS — R0609 Other forms of dyspnea: Secondary | ICD-10-CM

## 2020-09-21 NOTE — Progress Notes (Signed)
Subjective:     Patient ID: [name], female   DOB: 04-Sep-1945, 75 y.o.   MRN: [id number]  HPI PCP [name], [name], MD  HPI  IOV 12/05/2016  Chief Complaint  Patient presents with  . PULMONARY CONSULT    PULMONARY CONSULT FOR SOB referring doctor is Dr. [name]. DME is [facility] patient sleeps 8 hours a night with her CPAP machine     75 year old obese femal. She was admitted 11/12/2016 through 11/15/2016 for acute on chronic diastolic congestive heart failure. She is known to have sleep apnea essential hypertension coronary artery disease status post PTCA, paroxysmal A. fib, type 2 diabetes. She tells me that around 6 years ago she was diagnosed to have sleep apnea and started on CPAP therapy. Around this time she developed insidious onset of shortness of breath. Since then it has been progressively has been present on exertion relieved by rest. Class II-III activities bring it on. There is associated cough for the last 3 years. Both symptoms are progressive and is currently moderate in severity. Cough quality is dry. There is no associated chest pain or any radiation. There is no associated wheezing or orthopnea. Dyspnea can get worse during episodes of heart failure or atrial fibrillation. She's been on amiodarone therapy for the last 3 months after failing Tikosyn. She status post ablation. After the admission in May 2018 for heart failure that is concerned that she might have interstitial lung disease that she's been referred here. She feels currently well diuresed   Last chest x-ray 11/14/2016: Personally visualized and it looks like she has interstitial markings as opposed to heart failure   Echocardiogram April 2018: Pulmonary artery systolic pressure of 53 mmHg associated with grade 2 diastolic dysfunction  OV 03/22/1006  Chief Complaint  Patient presents with  . Follow-up    Pt here after HRCT. Pt states [place] the mountains recently and states she wasnt SOB at all but  when she returned home she was more SOB. Pt denies cough and CP/tightness.     Follow-up chronic diastolic heart failure and amiodarone therapy with? Of interstitial lung disease   Last visit June 2018 there was question of interstitial lung disease therefore we did a high-resolution CT chest with thoracic radiology believes that the findings are consistent with pulmonary edema. Therefore I held off on interstitial lung disease workup. In the interim she has lost 7 pounds. She went to the mountains and was active and return on the last week and she says that her dyspnea is actually better. She continues to be on 80 mg Lasix in the morning with 40 mg Lasix at night on her stable dose for several months. Because of the weight loss is actually feeling better although after return to [place] on lower altitude she says some of the shortness of breath is better but she still better than before. Walking desaturation test 185 feet 3 laps on room air: She walk only one lap and stopped due to knee pain she is upcoming knee replacement. A pulse ox did go down to 93% but it was still adequate.    IMPRESSION: 1. Cardiomegaly. Diffuse interlobular septal thickening, parahilar ground-glass attenuation and upper lung predominant mild patchy consolidation. These findings are most compatible with pulmonary edema due to congestive heart failure. 2. No specific findings of interstitial lung disease. No hyperdense foci of consolidation in the lungs specific for amiodarone pulmonary toxicity. 3. Mild to moderate mediastinal and mild bilateral  hilar lymphadenopathy, stable since 09/25/2016, suggesting reactive/congestive adenopathy. 4. Three-vessel coronary atherosclerosis.  Aortic Atherosclerosis (ICD10-I70.0).   Electronically Signed   By: Ilona Sorrel M.D.   On: 01/18/2017 16:41    OV 04/05/2017  Chief Complaint  Patient presents with  . Follow-up    Pt states that she went to the mountains  this past weekend 9/29-9/30 and breathing was a little labored then. Pt is working on losing weight which is helping with her breathing. Still becomes SOB on exertion and c/o sinus drainage. Denies any cough or CP.    259# -> 252# -> 245# with [place]   OCt 2020 tele visit    Today's tele-visit is a follow-up for sleep apnea.  She was last seen in 2018. Patient says she has been doing well on CPAP.  Says she never misses a night.  She wears it all night for at least 8 to 9 hours.  She feels rested with no significant daytime sleepiness.  States her mask is working well.  She does need order for new CPAP supplies. She is trying to lose weight but has not been as active with the COVID 19 virus  CPAP download shows excellent compliance with 100% usage.  She uses daily 9 hours.  She is on CPAP 13 cm H2O.  AHI 0.6.  Inpatient consult Nov 2021   Background Pulm History  - Obese lady.  With coronary artery disease that is post PTCA, A. fib, type 2 diabetes and hypertension. .  Used to be followed by Dr Rachell Cipro - [id number] for OSA (AHI 21/h in 2010)  and CPAP for which back then good compliance was reported.  Then in May 2018 2018 she she had an admission for diastolic heart failure.  In the aftermath of that she continues to have dyspnea and exertional desaturation in the setting of amiodarone therapy and there was concern that she had interstitial lung disease and was seen in the pulmonary clinic.  However radiology felt the CT scan was most consistent with heart failure.  With subsequent diuresis at follow-up in October 2018 she was improved though there was still crackles and there is clinical concern for ILD.  She was referred for heart catheterization and underwent this in November 2018.  Her V waves are prominent.  Her mean pulmonary pressure was elevated at 48 mm per mercury and a high cardiac output of 8.5 L/min with only minimally elevated pulmonary vascular resistance of 3 Wood units.  Dr.  Burt Knack recommended pulmonary vein isolation to rule out pulmonary vein stenosis.  In addition patient also had single-vessel coronary artery disease but with continued patency of the stented segment in the right coronary artery.  After that she was lost to pulmonary follow-up.  Review of the chart indicate atrial fibrillation has been a problem and an ablation has been planned by Dr. Curt Bears in January 2022.  She appears to no longer be on amiodarone.?  Stop date  She really presents now on May 19, 2020.  She tells at baseline she was not using oxygen at home.  She was exposed to her husband was sick with nonproductive cough.  Then she developed pain in her left ear.  She took outpatient antibiotics but did not respond she got progressively short of breath and got admitted to the hospital.  He had Covid and flu test have been negative.  She has had a Covid vaccine.  She required facemask and nasal cannula oxygen.  Chest x-ray just shows hyperinflation.  She had a CT chest without contrast [not a high-resolution CT chest] shows groundglass opacities in the upper lobes but otherwise no presence of chronic ILD.  She is on chronic eliquids  Pulmonary has been consulted   Dc summar 06/02/20  Acute hypoxemic respiratory failure/acute on chronic diastolic heart failure /Pulmonary hypertension Echocardiogram with preserved LV systolic function EF 55 to 60%, elevated PA pressure 63 mmHg systolic. Patient was seen by cardiology.  Patient was placed on diuretics with good diuresis.  Her oxygen requirements have improved.  She is now saturating normal on room air.  She is ambulated without any difficulty.    Atrial fibrillation with rapid ventricular response Thepatient has converted to2:1 flutter,rate in the 70's.  Seen by cardiology.  Will be discharged on beta-blocker diltiazem and dofetilide.  Continue apixaban.  She has an appointment with cardiology in January to consider ablation.     RSV bronchitis/ viral pneumonia (present on admission) Supportive care was provided.  Due to wheezing she was started on steroids with improvement.  She will be discharged on tapering doses.    Hyperglycemia due to steroids HbA1c noted to be 8.1.  Patient not on any glucose lowering agents at home.  This will need further evaluation in the outpatient setting.  Delirium/Sundowning Patient with hallucinations.  Likely due to medications and hospital stay.  Resolved.   Hyperlipidemia Continue with atorvastatin.  CKD II Creatinine noted to be 1.57 today.  She has good urine output.  Will recommend renal function be checked in the outpatient setting at follow-up..  Depression Cntinue withduloxetine  OV 09/21/2020  Subjective:  Patient ID: [name], female , DOB: 21-Dec-1945 , age 73 y.o. , MRN: [id number] , ADDRESS: [address] [place] [id number] PCP [name], [name], MD Patient Care Team: [name], MD as PCP - General Curt Bears, Ocie Doyne, MD as PCP - Electrophysiology (Cardiology) Nahser, Wonda Cheng, MD as PCP - Cardiology (Cardiology) Gaynelle Arabian, MD as Consulting Physician (Orthopedic Surgery) Brand Males, MD as Consulting Physician (Pulmonary Disease) Earnie Larsson, [facility] as Pharmacist (Pharmacist)  This Provider for this visit: Treatment Team:  Attending Provider: Brand Males, MD    09/21/2020 -   Chief Complaint  Patient presents with  . Follow-up    Hospital follow up-Shortness of breath with activity     HPI Ellarae C Vondrak 75 y.o. -post hospital follow-up for acute respiratory failure because of diastolic dysfunction and RSV bronchopneumonia in #2021.  She was extremely hypoxemic requiring high flow nasal cannula.  She required lot of diuresis and time.  She was discharged without oxygen.  Currently she says she is compliant with the CPAP without oxygen.  She is not needing any oxygen at rest or with exertion.  She has class  II dyspnea on exertion relieved by rest no chest pain.  She feels his dyspnea is baseline prehospitalization and is related to obesity.  Overall she is feeling good.  No orthopnea.  No proximal nocturnal dyspnea.  No cough.  No wheezing.   Simple office walk 185 feet x  3 laps goal with forehead probe 09/21/2020   O2 used ra  Number laps completed avg  Comments about pace 3 lap  Resting Pulse Ox/HR 94% and 66/min  Final Pulse Ox/HR 91% and 90/min  Desaturated </= 88% no  Desaturated <= 3% points yes  Got Tachycardic >/= 90/min yes  Symptoms at end of test soe dyspnea  Miscellaneous comments x      CT Chest  data  No results found.    PFT  PFT Results Latest Ref Rng & Units 11/03/2016  FVC-Pre L 1.38  FVC-Predicted Pre % 41  FVC-Post L 1.55  FVC-Predicted Post % 47  Pre FEV1/FVC % % 81  Post FEV1/FCV % % 85  FEV1-Pre L 1.12  FEV1-Predicted Pre % 44  FEV1-Post L 1.32  DLCO uncorrected ml/min/mmHg 14.71  DLCO UNC% % 53  DLCO corrected ml/min/mmHg 14.06  DLCO COR %Predicted % 50  DLVA Predicted % 88  TLC L 3.76  TLC % Predicted % 69  RV % Predicted % 103       has a past medical history of Anticoagulant long-term use, Anxiety, Arthritis, CAD (coronary artery disease) (8786,7672), CHF (congestive heart failure) ([place]), Chronic atrial fibrillation ([place]) (06/2007), Chronic kidney disease, CVA (cerebral vascular accident) ([place]) (0947,0962), Depression, Diplopia (06/19/2008), Dysrhythmia, Edema of lower extremity, Hyperlipidemia, Hypertension, Inferior myocardial infarction [facility]), Myocardial infarction ([place]) ([phone],[phone]), Obesity, OSA on CPAP, Pacemaker, Pneumonia (2014), Pulmonary hypertension ([place]), Shortness of breath, Skin cancer, Sleep apnea, Spondylolisthesis, TIA (transient ischemic attack) (2008), and Unspecified hemorrhoids without mention of complication (6/54/6503).   reports that she quit smoking about 38 years ago. Her smoking use included cigarettes. She started  smoking about 42 years ago. She has a 5.00 pack-year smoking history. She has never used smokeless tobacco.  Past Surgical History:  Procedure Laterality Date  . ABDOMINAL HYSTERECTOMY    . APPENDECTOMY  1984  . ATRIAL FIBRILLATION ABLATION N/A 09/29/2016   Procedure: Atrial Fibrillation Ablation;  Surgeon: Will Meredith Leeds, MD;  Location: [place] CV LAB;  Service: Cardiovascular;  Laterality: N/A;  . ATRIAL FIBRILLATION ABLATION N/A 02/07/2018   Procedure: ATRIAL FIBRILLATION ABLATION;  Surgeon: Constance Haw, MD;  Location: [place] CV LAB;  Service: Cardiovascular;  Laterality: N/A;  . ATRIAL FIBRILLATION ABLATION N/A 03/13/2019   Procedure: ATRIAL FIBRILLATION ABLATION;  Surgeon: Constance Haw, MD;  Location: [place] CV LAB;  Service: Cardiovascular;  Laterality: N/A;  . ATRIAL FIBRILLATION ABLATION N/A 07/07/2020   Procedure: ATRIAL FIBRILLATION ABLATION;  Surgeon: Constance Haw, MD;  Location: [place] CV LAB;  Service: Cardiovascular;  Laterality: N/A;  . BACK SURGERY    . CARDIOVERSION N/A 09/12/2017   Procedure: CARDIOVERSION;  Surgeon: Jerline Pain, MD;  Location: [facility] ENDOSCOPY;  Service: Cardiovascular;  Laterality: N/A;  . CARDIOVERSION N/A 12/13/2017   Procedure: CARDIOVERSION;  Surgeon: Sanda Klein, MD;  Location: [place] ENDOSCOPY;  Service: Cardiovascular;  Laterality: N/A;  . CARDIOVERSION N/A 02/18/2018   Procedure: CARDIOVERSION;  Surgeon: Dorothy Spark, MD;  Location: [facility] ENDOSCOPY;  Service: Cardiovascular;  Laterality: N/A;  . CARDIOVERSION N/A 05/20/2018   Procedure: CARDIOVERSION;  Surgeon: [name], MD;  Location: [facility] ENDOSCOPY;  Service: Cardiovascular;  Laterality: N/A;  . CARDIOVERSION N/A 04/16/2019   Procedure: CARDIOVERSION;  Surgeon: Donato Heinz, MD;  Location: [place];  Service: Endoscopy;  Laterality: N/A;  . CARDIOVERSION N/A 01/22/2020   Procedure: CARDIOVERSION;  Surgeon: Geralynn Rile, MD;   Location: [place];  Service: Cardiovascular;  Laterality: N/A;  . CARDIOVERSION N/A 02/18/2020   Procedure: CARDIOVERSION;  Surgeon: Elouise Munroe, MD;  Location: [facility] ENDOSCOPY;  Service: Cardiovascular;  Laterality: N/A;  . CATARACT EXTRACTION W/ INTRAOCULAR LENS  IMPLANT, BILATERAL Bilateral 01/15/2017- 03/2017  . CHOLECYSTECTOMY    . CORONARY ANGIOPLASTY  X 2  . CORONARY ANGIOPLASTY WITH STENT PLACEMENT  1998; ~ 2007; ?date   "1 stent; replaced stent; not sure when  I got the last stent" (04/23/2017)  . DOPPLER ECHOCARDIOGRAPHY  2009  . ELECTROPHYSIOLOGIC STUDY N/A 03/31/2016   Procedure: Cardioversion;  Surgeon: Evans Lance, MD;  Location: [place] CV LAB;  Service: Cardiovascular;  Laterality: N/A;  . ELECTROPHYSIOLOGIC STUDY N/A 08/04/2016   Procedure: Cardioversion;  Surgeon: Evans Lance, MD;  Location: [place] CV LAB;  Service: Cardiovascular;  Laterality: N/A;  . INSERT / REPLACE / REMOVE PACEMAKER  06/2007  . IR RADIOLOGY PERIPHERAL GUIDED IV START  01/31/2018  . IR US GUIDE VASC ACCESS LEFT  01/31/2018  . JOINT REPLACEMENT    . LAPAROSCOPIC CHOLECYSTECTOMY  1994  . LEFT AND RIGHT HEART CATHETERIZATION WITH CORONARY ANGIOGRAM N/A 10/06/2013   Procedure: LEFT AND RIGHT HEART CATHETERIZATION WITH CORONARY ANGIOGRAM;  Surgeon: Troy Sine, MD;  Location: [facility] CATH LAB;  Service: Cardiovascular;  Laterality: N/A;  . LEFT OOPHORECTOMY Left ~ 1989  . POSTERIOR LUMBAR FUSION  2000s - 04/2015 X 3   L3-4; L4-5; L2-3; Dr Trenton Gammon  . RIGHT/LEFT HEART CATH AND CORONARY ANGIOGRAPHY N/A 05/09/2017   Procedure: RIGHT/LEFT HEART CATH AND CORONARY ANGIOGRAPHY;  Surgeon: Sherren Mocha, MD;  Location: [place] CV LAB;  Service: Cardiovascular;  Laterality: N/A;  . SKIN CANCER EXCISION Right    [place]  . TEE WITHOUT CARDIOVERSION N/A 09/29/2016   Procedure: TRANSESOPHAGEAL ECHOCARDIOGRAM (TEE);  Surgeon: Jerline Pain, MD;  Location: [place];  Service: Cardiovascular;  Laterality:  N/A;  . TOTAL ABDOMINAL HYSTERECTOMY  1984   "uterus & right ovary"  . TOTAL KNEE ARTHROPLASTY Left 04/23/2017  . TOTAL KNEE ARTHROPLASTY Left 04/23/2017   Procedure: TOTAL KNEE ARTHROPLASTY;  Surgeon: Frederik Pear, MD;  Location: [place];  Service: Orthopedics;  Laterality: Left;  . ULTRASOUND GUIDANCE FOR VASCULAR ACCESS  05/09/2017   Procedure: Ultrasound Guidance For Vascular Access;  Surgeon: Sherren Mocha, MD;  Location: [place] CV LAB;  Service: Cardiovascular;;    Allergies  Allergen Reactions  . Hydrocodone-Guaifenesin Other (See Comments)    Confusion/ memory loss  . Adhesive [Tape] Rash and Other (See Comments)    Allergic to defibrillation pads.  . Pedi-Pre Tape Spray [Wound Dressing Adhesive] Other (See Comments) and Rash    Allergic to defibrillation pads.    Immunization History  Administered Date(s) Administered  . Fluad Quad(high Dose 65+) 04/10/2019  . Influenza Split 04/18/2011, 05/06/2012  . Influenza Whole 07/04/2007, 06/02/2009, 03/01/2010  . Influenza, High Dose Seasonal PF 04/03/2014, 04/18/2016, 04/05/2017, 05/06/2018  . Influenza,inj,Quad PF,6+ Mos 03/07/2013, 04/06/2015  . PFIZER(Purple Top)SARS-COV-2 Vaccination 07/22/2019, 08/10/2019, 03/29/2020  . Pneumococcal Conjugate-13 09/24/2013, 04/20/2020  . Pneumococcal Polysaccharide-23 07/03/2005, 07/04/2007, 04/18/2011  . Td 06/02/2009  . Zoster 07/03/2006    Family History  Problem Relation Age of Onset  . Suicidality Father        suicide death pt was 3 yrs, 61  . Arrhythmia Mother   . Hypertension Mother   . Diabetes Mother   . Dementia Mother   . Heart attack Brother   . Heart disease Paternal Aunt   . Prostate cancer Maternal Grandfather   . Diabetes Paternal Grandfather        fathers side of the family  . Colon cancer Maternal Aunt      Current Outpatient Medications:  .  Ascorbic Acid (VITAMIN C) 1000 MG tablet, Take 1,000 mg by mouth at bedtime. , Disp: , Rfl:  .  atorvastatin  (LIPITOR) 40 MG tablet, TAKE 1 TABLET BY MOUTH EVERY DAY, Disp: 90  tablet, Rfl: 3 .  Calcium Carbonate-Vitamin D (CALCIUM 600+D PO), Take 1 tablet by mouth daily. , Disp: , Rfl:  .  dofetilide (TIKOSYN) 250 MCG capsule, Take 1 capsule (250 mcg total) by mouth 2 (two) times daily., Disp: 180 capsule, Rfl: 2 .  DULoxetine (CYMBALTA) 60 MG capsule, TAKE 1 CAPSULE BY MOUTH EVERY DAY, Disp: 90 capsule, Rfl: 1 .  ELIQUIS 5 MG TABS tablet, TAKE 1 TABLET BY MOUTH TWICE A DAY (Patient taking differently: Take 5 mg by mouth 2 (two) times daily.), Disp: 180 tablet, Rfl: 2 .  empagliflozin (JARDIANCE) 25 MG TABS tablet, Take 1 tablet (25 mg total) by mouth daily before breakfast., Disp: 30 tablet, Rfl: 3 .  fluticasone (FLONASE) 50 MCG/ACT nasal spray, Place 1 spray into both nostrils at bedtime., Disp: , Rfl:  .  KLOR-CON M20 20 MEQ tablet, TAKE 1 TABLET (20 MEQ TOTAL) BY MOUTH 2 (TWO) TIMES DAILY. MAY TAKE AN EXTRA TABLET (20 MG) DAILY AS NEEDED FOR LEG CRAMPS., Disp: 120 tablet, Rfl: 0 .  loratadine (CLARITIN) 10 MG tablet, TAKE 1 TABLET BY MOUTH EVERY DAY (Patient taking differently: Take 10 mg by mouth daily.), Disp: 90 tablet, Rfl: 1 .  Lysine 1000 MG TABS, Take 1,000 mg by mouth at bedtime. , Disp: , Rfl:  .  metFORMIN (GLUCOPHAGE) 500 MG tablet, TAKE 1 TABLET (500 MG TOTAL) BY MOUTH 2 (TWO) TIMES DAILY WITH A MEAL. INCREASE TO 3 PER DAY AND THEN 4 PER DAY, Disp: 360 tablet, Rfl: 1 .  metolazone (ZAROXOLYN) 2.5 MG tablet, Take 1 tablet (2.5 mg total) by mouth every 4 days AS NEEDED for swelling (Patient taking differently: Take 2.5 mg by mouth See admin instructions. Take 1 tablet (2.5 mg total) by mouth every 4 days as needed for swelling/overnight weight gain), Disp: 7 tablet, Rfl: 2 .  metoprolol tartrate (LOPRESSOR) 50 MG tablet, TAKE 1 TABLET BY MOUTH TWICE A DAY, Disp: 60 tablet, Rfl: 1 .  Multiple Vitamin (MULTIVITAMIN WITH MINERALS) TABS tablet, Take 1 tablet by mouth daily. Centrum, Disp: , Rfl:  .   nitroGLYCERIN (NITROSTAT) 0.4 MG SL tablet, PLACE 1 TABLET (0.4 MG TOTAL) UNDER THE TONGUE EVERY 5 (FIVE) MINUTES X 3 DOSES AS NEEDED FOR CHEST PAIN., Disp: 75 tablet, Rfl: 1 .  nystatin-triamcinolone (MYCOLOG II) cream, Apply 1 application topically 2 (two) times daily as needed. (Patient taking differently: Apply 1 application topically 2 (two) times daily as needed (rash).), Disp: 30 g, Rfl: 1 .  Polyethyl Glycol-Propyl Glycol (SYSTANE) 0.4-0.3 % SOLN, Place 1 drop into both eyes at bedtime., Disp: , Rfl:  .  torsemide (DEMADEX) 20 MG tablet, TAKE 2 TABLETS (40 MG TOTAL) BY MOUTH 2 (TWO) TIMES DAILY., Disp: 360 tablet, Rfl: 3 .  valACYclovir (VALTREX) 1000 MG tablet, Take 2 tablets (2,000 mg total) by mouth every 12 (twelve) hours as needed (fever blisters/cold sores.)., Disp: 30 tablet, Rfl: 1 .  diltiazem (CARDIZEM LA) 360 MG 24 hr tablet, Take 1 tablet (360 mg total) by mouth daily. (Patient not taking: Reported on 09/21/2020), Disp: 90 tablet, Rfl: 3      Objective:   Vitals:   09/21/20 1448  BP: 128/62  Pulse: 68  Temp: 97.9 F (36.6 C)  TempSrc: Temporal  SpO2: 95%  Weight: 266 lb (120.7 kg)  Height: 5\' 6"  (1.676 m)    Estimated body mass index is 42.93 kg/m as calculated from the following:   Height as of this encounter: 5\' 6"  (1.676 m).  Weight as of this encounter: 266 lb (120.7 kg).  @WEIGHTCHANGE @  [employer]   09/21/20 1448  Weight: 266 lb (120.7 kg)     Physical Exam   General: No distress. Looks well. Obese Neuro: Alert and Oriented x 3. GCS 15. Speech normal Psych: Pleasant Resp:  Barrel Chest - no.  Wheeze - no, Crackles - no, No overt respiratory distress CVS: Normal heart sounds. Murmurs - no Ext: Stigmata of Connective Tissue Disease - no HEENT: Normal upper airway. PEERL +. No post nasal drip        Assessment:       ICD-10-CM   1. History of respiratory syncytial virus (RSV) infection  Z86.19   2. Viral pneumonia  J12.9   3. History  of acute respiratory failure  Z87.09        Plan:     Patient Instructions     ICD-10-CM   1. History of respiratory syncytial virus (RSV) infection  Z86.19   2. Viral pneumonia  J12.9   3. History of acute respiratory failure  Z87.09   4. Dyspnea on exertion  R06.00     Clinically improved but showing tendency to drop pulse ox t hough still adequate  Plan  - do simple walk test  - do HRCT supine and prone in 6 months  Followup  - APP or Dr Chase Caller in 6 months or sooner if needed   (Level 04: Estb 30-39 min visit type: on-site physical face to visit visit spent in total care time and counseling or/and coordination of care by this undersigned MD - Dr Brand Males. This includes one or more of the following on this same day 09/21/2020: pre-charting, chart review, note writing, documentation discussion of test results, diagnostic or treatment recommendations, prognosis, risks and benefits of management options, instructions, education, compliance or risk-factor reduction. It excludes time spent by the [facility] or office staff in the care of the patient . Actual time is 30 min)   SIGNATURE    Dr. Brand Males, M.D., F.C.C.P,  Pulmonary and Critical Care Medicine Staff Physician, [name] Director - Interstitial Lung Disease  Program  Pulmonary [place] [facility] [place], [place], [address]  Pager: [phone], If no answer or between  15:00h - 7:00h: call [phone] Telephone: [phone]  3:29 PM 09/21/2020

## 2020-09-21 NOTE — Patient Instructions (Addendum)
ICD-10-CM   1. History of respiratory syncytial virus (RSV) infection  Z86.19   2. Viral pneumonia  J12.9   3. History of acute respiratory failure  Z87.09   4. Dyspnea on exertion  R06.00     Clinically improved but showing tendency to drop pulse ox t hough still adequate  Plan  - do simple walk test  - do HRCT supine and prone in 6 months  Followup  - APP or Dr Chase Caller in 6 months or sooner if needed

## 2020-09-25 ENCOUNTER — Other Ambulatory Visit: Payer: Self-pay | Admitting: Internal Medicine

## 2020-09-27 ENCOUNTER — Other Ambulatory Visit: Payer: Self-pay | Admitting: Internal Medicine

## 2020-09-30 ENCOUNTER — Other Ambulatory Visit (HOSPITAL_COMMUNITY): Payer: Self-pay | Admitting: Nurse Practitioner

## 2020-10-04 NOTE — Progress Notes (Signed)
Chief Complaint  Patient presents with  . Follow-up  . Diabetes  . Medication Management    HPI: [name] 75 y.o. come in for Chronic disease management  Fu DM  In jardiance to 81    Am bgs recnetly 178 this am  180 176  160    This am  Had swelling and has regimen to take metalozone   As needed every 4 days if needed   Had left arm lesion removed and was a pre cancer   Has new one on right shin . To have  Lung scan  To have  This week  And alsocmnitz.   BPok  Tremor off and on  jusband more  Concerned than she  In am sometimes   No tadcaffiene  Pos family hx of later age tremor ( Not parkinsons) ROS: See pertinent positives and negatives per HPI.  Past Medical History:  Diagnosis Date  . Anticoagulant long-term use    pradaxa  . Anxiety   . Arthritis    "fingers, lower back" (04/23/2017   . CAD (coronary artery disease) 9735,3299   post PTCA with bare-metal stenting to mid RCA in December 2004     . CHF (congestive heart failure) ([place])   . Chronic atrial fibrillation ([place]) 06/2007   Tachybradycardia pacemaker  . Chronic kidney disease    10% function - ?R, other kidney is compensating    . CVA (cerebral vascular accident) [facility]) [phone]   denies residual on 04/23/2017  . Depression   . Diplopia 06/19/2008   Qualifier: Diagnosis of  By: [name] MD, [name]   . Dysrhythmia    ATRIAL FIBRILATION  . Edema of lower extremity   . Hyperlipidemia   . Hypertension   . Inferior myocardial infarction [facility])    acute inferior wall mi/other medical hx  . Myocardial infarction ([place]) [id number]  . Obesity   . OSA on CPAP    last test- 2010  . Pacemaker   . Pneumonia 2014   tx. ----  [facility]  . Pulmonary hypertension ([place])    moderate pulmonary hypertension by 10/2016 echo and 10/2013 cardiac cath  . Shortness of breath   . Skin cancer    "cut off right [place]; burned off LLE" (04/23/2017)  . Sleep apnea   . Spondylolisthesis   . TIA (transient ischemic  attack) 2008  . Unspecified hemorrhoids without mention of complication 9/62/2297   Colonoscopy--Dr. Carlean Purl     Family History  Problem Relation Age of Onset  . Suicidality Father        suicide death pt was 3 yrs, 32  . Arrhythmia Mother   . Hypertension Mother   . Diabetes Mother   . Dementia Mother   . Heart attack Brother   . Heart disease Paternal Aunt   . Prostate cancer Maternal Grandfather   . Diabetes Paternal Grandfather        fathers side of the family  . Colon cancer Maternal Aunt     Social History   Socioeconomic History  . Marital status: Married    Spouse name: Not on file  . Number of children: 0  . Years of education: HS  . Highest education level: Not on file  Occupational History  . Occupation: retired    Comment: previously worked [employer]  . Smoking status: Former Smoker    Packs/day: 1.00    Years: 5.00    Pack years: 5.00  Types: Cigarettes    Start date: 05/11/1978    Quit date: 07/03/1982    Years since quitting: 38.2  . Smokeless tobacco: Never Used  Vaping Use  . Vaping Use: Never used  Substance and Sexual Activity  . Alcohol use: Yes    Comment: 04/23/2017 "once q 6 months; glass of wine"  . Drug use: No  . Sexual activity: Not Currently  Other Topics Concern  . 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   Married    Originally from [place] of two, high school education   Former smoker Pension scheme manager dogs 7    Retired from [employer] 2    Social Determinants of Radio broadcast assistant Strain: Not on [employer] Insecurity: Not on file  Transportation Needs: No Transportation Needs  . Lack of Transportation (Medical): No  . Lack of Transportation (Non-Medical): No  Physical Activity: Not on file  Stress: Not on file  Social Connections: Not on file    Outpatient Medications Prior to Visit   Medication Sig Dispense Refill  . Ascorbic Acid (VITAMIN C) 1000 MG tablet Take 1,000 mg by mouth at bedtime.     [name] atorvastatin (LIPITOR) 40 MG tablet TAKE 1 TABLET BY MOUTH EVERY DAY 90 tablet 3  . Calcium Carbonate-Vitamin D (CALCIUM 600+D PO) Take 1 tablet by mouth daily.     [name] diltiazem (CARDIZEM LA) 360 MG 24 hr tablet Take 1 tablet (360 mg total) by mouth daily. 90 tablet 3  . dofetilide (TIKOSYN) 250 MCG capsule TAKE ONE CAPSULE BY MOUTH TWICE A DAY 180 capsule 2  . DULoxetine (CYMBALTA) 60 MG capsule TAKE 1 CAPSULE BY MOUTH EVERY DAY 90 capsule 1  . ELIQUIS 5 MG TABS tablet TAKE 1 TABLET BY MOUTH TWICE A DAY (Patient taking differently: Take 5 mg by mouth 2 (two) times daily.) 180 tablet 2  . empagliflozin (JARDIANCE) 25 MG TABS tablet Take 1 tablet (25 mg total) by mouth daily before breakfast. 30 tablet 3  . fluticasone (FLONASE) 50 MCG/ACT nasal spray Place 1 spray into both nostrils at bedtime.    [name] KLOR-CON M20 20 MEQ tablet TAKE 1 TABLET (20 MEQ TOTAL) BY MOUTH 2 (TWO) TIMES DAILY. MAY TAKE AN EXTRA TABLET (20 MG) DAILY AS NEEDED FOR LEG CRAMPS. 120 tablet 0  . loratadine (CLARITIN) 10 MG tablet TAKE 1 TABLET BY MOUTH EVERY DAY 90 tablet 1  . Lysine 1000 MG TABS Take 1,000 mg by mouth at bedtime.     . metFORMIN (GLUCOPHAGE) 500 MG tablet TAKE 1 TABLET (500 MG TOTAL) BY MOUTH 2 (TWO) TIMES DAILY WITH A MEAL. INCREASE TO 3 PER DAY AND THEN 4 PER DAY 360 tablet 1  . metolazone (ZAROXOLYN) 2.5 MG tablet Take 1 tablet (2.5 mg total) by mouth every 4 days AS NEEDED for swelling (Patient taking differently: Take 2.5 mg by mouth See admin instructions. Take 1 tablet (2.5 mg total) by mouth every 4 days as needed for swelling/overnight weight gain) 7 tablet 2  . metoprolol tartrate (LOPRESSOR) 50 MG tablet TAKE 1 TABLET BY MOUTH TWICE A DAY 60 tablet 1  . Multiple Vitamin (MULTIVITAMIN WITH MINERALS) TABS tablet Take 1 tablet by mouth daily. Centrum    . nitroGLYCERIN (NITROSTAT) 0.4 MG SL  tablet PLACE 1 TABLET (0.4 MG TOTAL) UNDER THE TONGUE EVERY  5 (FIVE) MINUTES X 3 DOSES AS NEEDED FOR CHEST PAIN. 75 tablet 1  . nystatin-triamcinolone (MYCOLOG II) cream Apply 1 application topically 2 (two) times daily as needed. (Patient taking differently: Apply 1 application topically 2 (two) times daily as needed (rash).) 30 g 1  . Polyethyl Glycol-Propyl Glycol (SYSTANE) 0.4-0.3 % SOLN Place 1 drop into both eyes at bedtime.    . torsemide (DEMADEX) 20 MG tablet TAKE 2 TABLETS (40 MG TOTAL) BY MOUTH 2 (TWO) TIMES DAILY. 360 tablet 3  . valACYclovir (VALTREX) 1000 MG tablet Take 2 tablets (2,000 mg total) by mouth every 12 (twelve) hours as needed (fever blisters/cold sores.). 30 tablet 1   No facility-administered medications prior to visit.     EXAM:  BP 130/70 (BP Location: Right Arm, Patient Position: Sitting, Cuff Size: Large)   Pulse 65   Temp 98.2 F (36.8 C) (Oral)   Ht 5\' 6"  (1.676 m)   Wt 265 lb 12.8 oz (120.6 kg)   SpO2 94%   BMI 42.90 kg/m   Body mass index is 42.9 kg/m.  GENERAL: vitals reviewed and listed above, alert, oriented, appears well hydrated and in no acute distress slihg t dyspea on exertgion HEENT: atraumatic, conjunctiva  clear, no obvious abnormalities on inspection of external nose and ears OP : masked   NECK: no obvious masses on inspection palpation  LUNGS: clear to auscultation bilaterally, no wheezes, rales or rhonchi, good air movement CV: HRRR, no clubbing cyanosis or  1-2+ edema nl cap refill  MS: moves all extremities without noticeable focal  Abnormality Skin right shin 6 mm keratotic lesion  PSYCH: pleasant and cooperative, no obvious depression or anxiety Lab Results  Component Value Date   WBC 6.3 06/26/2020   HGB 14.5 06/26/2020   HCT 44.3 06/26/2020   PLT 197 06/26/2020   GLUCOSE 247 (H) 08/13/2020   CHOL 151 04/10/2019   TRIG 160.0 (H) 04/10/2019   HDL 50.80 04/10/2019   LDLCALC 68 04/10/2019   ALT 28 01/28/2020   AST 33  01/28/2020   NA 140 08/13/2020   K 4.0 08/13/2020   CL 99 08/13/2020   CREATININE 1.25 (H) 08/13/2020   BUN 28 (H) 08/13/2020   CO2 25 08/13/2020   TSH 2.77 01/28/2020   INR 1.3 (H) 02/17/2020   HGBA1C 7.1 (A) 10/05/2020   MICROALBUR <0.7 01/23/2017   BP Readings from Last 3 Encounters:  10/05/20 130/70  09/21/20 128/62  08/23/20 110/76    ASSESSMENT AND PLAN:  Discussed the following assessment and plan:  Type 2 diabetes mellitus with hyperglycemia, without long-term current use of insulin (HCC) - improved  continue  for now   - Plan: POCT glycosylated hemoglobin (Hb A1C)  Medication management  Tremor - prob  essential familia   will follow   Chronic diastolic heart failure (HCC)  Morbid obesity ([place])  Skin lesion  -Patient advised to return or notify health care team  if  new concerns arise.  Patient Instructions  Continue jardiance   A 1c is now 7.1  Better !   Plan rov in 3-4 months or earlier  If needed   bg goal  120- in am  And 180 after eating as possible.  On average.   If needed we can add  Other medicaitons    Willodean Leven K. Naquan Garman M.D.

## 2020-10-05 ENCOUNTER — Encounter: Payer: Self-pay | Admitting: Internal Medicine

## 2020-10-05 ENCOUNTER — Other Ambulatory Visit: Payer: Self-pay

## 2020-10-05 ENCOUNTER — Ambulatory Visit (INDEPENDENT_AMBULATORY_CARE_PROVIDER_SITE_OTHER): Payer: PPO | Admitting: Internal Medicine

## 2020-10-05 VITALS — BP 130/70 | HR 65 | Temp 98.2°F | Ht 66.0 in | Wt 265.8 lb

## 2020-10-05 DIAGNOSIS — Z79899 Other long term (current) drug therapy: Secondary | ICD-10-CM

## 2020-10-05 DIAGNOSIS — R251 Tremor, unspecified: Secondary | ICD-10-CM

## 2020-10-05 DIAGNOSIS — I5032 Chronic diastolic (congestive) heart failure: Secondary | ICD-10-CM

## 2020-10-05 DIAGNOSIS — L989 Disorder of the skin and subcutaneous tissue, unspecified: Secondary | ICD-10-CM

## 2020-10-05 DIAGNOSIS — E1165 Type 2 diabetes mellitus with hyperglycemia: Secondary | ICD-10-CM

## 2020-10-05 LAB — POCT GLYCOSYLATED HEMOGLOBIN (HGB A1C): Hemoglobin A1C: 7.1 % — AB (ref 4.0–5.6)

## 2020-10-05 NOTE — Patient Instructions (Addendum)
Continue jardiance   A 1c is now 7.1  Better !   Plan rov in 3-4 months or earlier  If needed   bg goal  120- in am  And 180 after eating as possible.  On average.   If needed we can add  Other medicaitons

## 2020-10-07 ENCOUNTER — Ambulatory Visit: Payer: PPO | Admitting: Cardiology

## 2020-10-07 ENCOUNTER — Other Ambulatory Visit: Payer: Self-pay

## 2020-10-07 ENCOUNTER — Encounter: Payer: Self-pay | Admitting: Cardiology

## 2020-10-07 VITALS — BP 138/62 | HR 68 | Ht 66.0 in | Wt 260.4 lb

## 2020-10-07 DIAGNOSIS — I4819 Other persistent atrial fibrillation: Secondary | ICD-10-CM | POA: Diagnosis not present

## 2020-10-07 DIAGNOSIS — I495 Sick sinus syndrome: Secondary | ICD-10-CM

## 2020-10-07 DIAGNOSIS — I10 Essential (primary) hypertension: Secondary | ICD-10-CM

## 2020-10-07 DIAGNOSIS — G4733 Obstructive sleep apnea (adult) (pediatric): Secondary | ICD-10-CM | POA: Diagnosis not present

## 2020-10-07 DIAGNOSIS — I5032 Chronic diastolic (congestive) heart failure: Secondary | ICD-10-CM | POA: Diagnosis not present

## 2020-10-07 NOTE — Patient Instructions (Addendum)
Medication Instructions:  Your physician recommends that you continue on your current medications as directed. Please refer to the Current Medication list given to you today.  Labwork: None ordered.  Testing/Procedures: None ordered.  Follow-Up: Your physician wants you to follow-up in: 01/18/21 at 3:15 pm with Allegra Lai, MD   Any Other Special Instructions Will Be Listed Below (If Applicable).  If you need a refill on your cardiac medications before your next appointment, please call your pharmacy.

## 2020-10-07 NOTE — Progress Notes (Signed)
Electrophysiology Office Note   Date:  10/07/2020   ID:  BELLAH ALIA, DOB 1946/05/13, MRN [id number]  PCP:  [name], MD  Primary Electrophysiologist: Gaye Alken, MD    No chief complaint on file.    History of Present Illness: [name] is a 75 y.o. female who presents today for electrophysiology evaluation.     She has a history significant for coronary artery disease status post RCA stent, chronic diastolic heart failure, atrial fibrillation with tachybradycardia syndrome status post [facility] Jude pacemaker, CKD, hypertension, hyperlipidemia, OSA on CPAP.  She is status post AF ablation x3.  She has been loaded on dofetilide.  Unfortunately she came back to clinic with atrial flutter.  She is now status post atrial flutter ablation of a mitral annular flutter.  Today, denies symptoms of palpitations, chest pain, shortness of breath, orthopnea, PND, lower extremity edema, claudication, dizziness, presyncope, syncope, bleeding, or neurologic sequela. The patient is tolerating medications without difficulties.  Since her ablation she has done well.  She has no complaints at this time.  She continues to note short episodes of palpitations, but she overall feels well.  She has a 3.2% atrial fibrillation burden based on device interrogation.  Past Medical History:  Diagnosis Date  . Anticoagulant long-term use    pradaxa  . Anxiety   . Arthritis    "fingers, lower back" (04/23/2017   . CAD (coronary artery disease) 3546,5681   post PTCA with bare-metal stenting to mid RCA in December 2004     . CHF (congestive heart failure) ([place])   . Chronic atrial fibrillation ([place]) 06/2007   Tachybradycardia pacemaker  . Chronic kidney disease    10% function - ?R, other kidney is compensating    . CVA (cerebral vascular accident) [facility]) 2751,7001   denies residual on 04/23/2017  . Depression   . Diplopia 06/19/2008   Qualifier: Diagnosis of  By: [name] MD, [name]   .  Dysrhythmia    ATRIAL FIBRILATION  . Edema of lower extremity   . Hyperlipidemia   . Hypertension   . Inferior myocardial infarction [facility])    acute inferior wall mi/other medical hx  . Myocardial infarction ([place]) [id number]  . Obesity   . OSA on CPAP    last test- 2010  . Pacemaker   . Pneumonia 2014   tx. ----  [facility]  . Pulmonary hypertension ([place])    moderate pulmonary hypertension by 10/2016 echo and 10/2013 cardiac cath  . Shortness of breath   . Skin cancer    "cut off right [place]; burned off LLE" (04/23/2017)  . Sleep apnea   . Spondylolisthesis   . TIA (transient ischemic attack) 2008  . Unspecified hemorrhoids without mention of complication 7/49/4496   Colonoscopy--Dr. Carlean Purl    Past Surgical History:  Procedure Laterality Date  . ABDOMINAL HYSTERECTOMY    . APPENDECTOMY  1984  . ATRIAL FIBRILLATION ABLATION N/A 09/29/2016   Procedure: Atrial Fibrillation Ablation;  Surgeon: Taijon Vink Meredith Leeds, MD;  Location: [place] CV LAB;  Service: Cardiovascular;  Laterality: N/A;  . ATRIAL FIBRILLATION ABLATION N/A 02/07/2018   Procedure: ATRIAL FIBRILLATION ABLATION;  Surgeon: Constance Haw, MD;  Location: [place] CV LAB;  Service: Cardiovascular;  Laterality: N/A;  . ATRIAL FIBRILLATION ABLATION N/A 03/13/2019   Procedure: ATRIAL FIBRILLATION ABLATION;  Surgeon: Constance Haw, MD;  Location: [place] CV LAB;  Service: Cardiovascular;  Laterality: N/A;  . ATRIAL FIBRILLATION ABLATION  N/A 07/07/2020   Procedure: ATRIAL FIBRILLATION ABLATION;  Surgeon: Constance Haw, MD;  Location: [place] CV LAB;  Service: Cardiovascular;  Laterality: N/A;  . BACK SURGERY    . CARDIOVERSION N/A 09/12/2017   Procedure: CARDIOVERSION;  Surgeon: Jerline Pain, MD;  Location: [facility] ENDOSCOPY;  Service: Cardiovascular;  Laterality: N/A;  . CARDIOVERSION N/A 12/13/2017   Procedure: CARDIOVERSION;  Surgeon: Sanda Klein, MD;  Location: [place] ENDOSCOPY;  Service:  Cardiovascular;  Laterality: N/A;  . CARDIOVERSION N/A 02/18/2018   Procedure: CARDIOVERSION;  Surgeon: Dorothy Spark, MD;  Location: [facility] ENDOSCOPY;  Service: Cardiovascular;  Laterality: N/A;  . CARDIOVERSION N/A 05/20/2018   Procedure: CARDIOVERSION;  Surgeon: [name], MD;  Location: [facility] ENDOSCOPY;  Service: Cardiovascular;  Laterality: N/A;  . CARDIOVERSION N/A 04/16/2019   Procedure: CARDIOVERSION;  Surgeon: Donato Heinz, MD;  Location: [place];  Service: Endoscopy;  Laterality: N/A;  . CARDIOVERSION N/A 01/22/2020   Procedure: CARDIOVERSION;  Surgeon: Geralynn Rile, MD;  Location: [place];  Service: Cardiovascular;  Laterality: N/A;  . CARDIOVERSION N/A 02/18/2020   Procedure: CARDIOVERSION;  Surgeon: Elouise Munroe, MD;  Location: [facility] ENDOSCOPY;  Service: Cardiovascular;  Laterality: N/A;  . CATARACT EXTRACTION W/ INTRAOCULAR LENS  IMPLANT, BILATERAL Bilateral 01/15/2017- 03/2017  . CHOLECYSTECTOMY    . CORONARY ANGIOPLASTY  X 2  . CORONARY ANGIOPLASTY WITH STENT PLACEMENT  1998; ~ 2007; ?date   "1 stent; replaced stent; not sure when I got the last stent" (04/23/2017)  . DOPPLER ECHOCARDIOGRAPHY  2009  . ELECTROPHYSIOLOGIC STUDY N/A 03/31/2016   Procedure: Cardioversion;  Surgeon: Evans Lance, MD;  Location: [place] CV LAB;  Service: Cardiovascular;  Laterality: N/A;  . ELECTROPHYSIOLOGIC STUDY N/A 08/04/2016   Procedure: Cardioversion;  Surgeon: Evans Lance, MD;  Location: [place] CV LAB;  Service: Cardiovascular;  Laterality: N/A;  . INSERT / REPLACE / REMOVE PACEMAKER  06/2007  . IR RADIOLOGY PERIPHERAL GUIDED IV START  01/31/2018  . IR US GUIDE VASC ACCESS LEFT  01/31/2018  . JOINT REPLACEMENT    . LAPAROSCOPIC CHOLECYSTECTOMY  1994  . LEFT AND RIGHT HEART CATHETERIZATION WITH CORONARY ANGIOGRAM N/A 10/06/2013   Procedure: LEFT AND RIGHT HEART CATHETERIZATION WITH CORONARY ANGIOGRAM;  Surgeon: Troy Sine, MD;  Location: [facility] CATH  LAB;  Service: Cardiovascular;  Laterality: N/A;  . LEFT OOPHORECTOMY Left ~ 1989  . POSTERIOR LUMBAR FUSION  2000s - 04/2015 X 3   L3-4; L4-5; L2-3; Dr Trenton Gammon  . RIGHT/LEFT HEART CATH AND CORONARY ANGIOGRAPHY N/A 05/09/2017   Procedure: RIGHT/LEFT HEART CATH AND CORONARY ANGIOGRAPHY;  Surgeon: Sherren Mocha, MD;  Location: [place] CV LAB;  Service: Cardiovascular;  Laterality: N/A;  . SKIN CANCER EXCISION Right    [place]  . TEE WITHOUT CARDIOVERSION N/A 09/29/2016   Procedure: TRANSESOPHAGEAL ECHOCARDIOGRAM (TEE);  Surgeon: Jerline Pain, MD;  Location: [place];  Service: Cardiovascular;  Laterality: N/A;  . TOTAL ABDOMINAL HYSTERECTOMY  1984   "uterus & right ovary"  . TOTAL KNEE ARTHROPLASTY Left 04/23/2017  . TOTAL KNEE ARTHROPLASTY Left 04/23/2017   Procedure: TOTAL KNEE ARTHROPLASTY;  Surgeon: Frederik Pear, MD;  Location: [place];  Service: Orthopedics;  Laterality: Left;  . ULTRASOUND GUIDANCE FOR VASCULAR ACCESS  05/09/2017   Procedure: Ultrasound Guidance For Vascular Access;  Surgeon: Sherren Mocha, MD;  Location: [place] CV LAB;  Service: Cardiovascular;;     Current Outpatient Medications  Medication Sig Dispense Refill  . Ascorbic Acid (VITAMIN  C) 1000 MG tablet Take 1,000 mg by mouth at bedtime.     [name] atorvastatin (LIPITOR) 40 MG tablet TAKE 1 TABLET BY MOUTH EVERY DAY 90 tablet 3  . Calcium Carbonate-Vitamin D (CALCIUM 600+D PO) Take 1 tablet by mouth daily.     [name] diltiazem (CARDIZEM LA) 360 MG 24 hr tablet Take 1 tablet (360 mg total) by mouth daily. 90 tablet 3  . dofetilide (TIKOSYN) 250 MCG capsule TAKE ONE CAPSULE BY MOUTH TWICE A DAY 180 capsule 2  . DULoxetine (CYMBALTA) 60 MG capsule TAKE 1 CAPSULE BY MOUTH EVERY DAY 90 capsule 1  . ELIQUIS 5 MG TABS tablet TAKE 1 TABLET BY MOUTH TWICE A DAY (Patient taking differently: Take 5 mg by mouth 2 (two) times daily.) 180 tablet 2  . empagliflozin (JARDIANCE) 25 MG TABS tablet Take 1 tablet (25 mg total) by mouth  daily before breakfast. 30 tablet 3  . fluticasone (FLONASE) 50 MCG/ACT nasal spray Place 1 spray into both nostrils at bedtime.    [name] KLOR-CON M20 20 MEQ tablet TAKE 1 TABLET (20 MEQ TOTAL) BY MOUTH 2 (TWO) TIMES DAILY. MAY TAKE AN EXTRA TABLET (20 MG) DAILY AS NEEDED FOR LEG CRAMPS. 120 tablet 0  . loratadine (CLARITIN) 10 MG tablet TAKE 1 TABLET BY MOUTH EVERY DAY 90 tablet 1  . Lysine 1000 MG TABS Take 1,000 mg by mouth at bedtime.     . metFORMIN (GLUCOPHAGE) 500 MG tablet TAKE 1 TABLET (500 MG TOTAL) BY MOUTH 2 (TWO) TIMES DAILY WITH A MEAL. INCREASE TO 3 PER DAY AND THEN 4 PER DAY 360 tablet 1  . metolazone (ZAROXOLYN) 2.5 MG tablet Take 1 tablet (2.5 mg total) by mouth every 4 days AS NEEDED for swelling (Patient taking differently: Take 2.5 mg by mouth See admin instructions. Take 1 tablet (2.5 mg total) by mouth every 4 days as needed for swelling/overnight weight gain) 7 tablet 2  . metoprolol tartrate (LOPRESSOR) 50 MG tablet TAKE 1 TABLET BY MOUTH TWICE A DAY 60 tablet 1  . Multiple Vitamin (MULTIVITAMIN WITH MINERALS) TABS tablet Take 1 tablet by mouth daily. Centrum    . nitroGLYCERIN (NITROSTAT) 0.4 MG SL tablet PLACE 1 TABLET (0.4 MG TOTAL) UNDER THE TONGUE EVERY 5 (FIVE) MINUTES X 3 DOSES AS NEEDED FOR CHEST PAIN. 75 tablet 1  . nystatin-triamcinolone (MYCOLOG II) cream Apply 1 application topically 2 (two) times daily as needed. (Patient taking differently: Apply 1 application topically 2 (two) times daily as needed (rash).) 30 g 1  . Polyethyl Glycol-Propyl Glycol (SYSTANE) 0.4-0.3 % SOLN Place 1 drop into both eyes at bedtime.    . torsemide (DEMADEX) 20 MG tablet TAKE 2 TABLETS (40 MG TOTAL) BY MOUTH 2 (TWO) TIMES DAILY. 360 tablet 3  . valACYclovir (VALTREX) 1000 MG tablet Take 2 tablets (2,000 mg total) by mouth every 12 (twelve) hours as needed (fever blisters/cold sores.). 30 tablet 1   No current facility-administered medications for this visit.    Allergies:    Hydrocodone-guaifenesin, Adhesive [tape], and Pedi-pre tape spray [wound dressing adhesive]   Social History:  The patient  reports that she quit smoking about 38 years ago. Her smoking use included cigarettes. She started smoking about 42 years ago. She has a 5.00 pack-year smoking history. She has never used smokeless tobacco. She reports current alcohol use. She reports that she does not use drugs.   Family History:  The patient's family history includes Arrhythmia in her mother; Colon cancer in  her maternal aunt; Dementia in her mother; Diabetes in her mother and paternal grandfather; Heart attack in her brother; Heart disease in her paternal aunt; Hypertension in her mother; Prostate cancer in her maternal grandfather; Suicidality in her father.   ROS:  Please see the history of present illness.   Otherwise, review of systems is positive for none.   All other systems are reviewed and negative.   PHYSICAL EXAM: VS:  BP 138/62   Pulse 68   Ht 5\' 6"  (1.676 m)   Wt 260 lb 6.4 oz (118.1 kg)   SpO2 90%   BMI 42.03 kg/m  , BMI Body mass index is 42.03 kg/m. GEN: Well nourished, well developed, in no acute distress  HEENT: normal  Neck: no JVD, carotid bruits, or masses Cardiac: RRR; no murmurs, rubs, or gallops,no edema  Respiratory:  clear to auscultation bilaterally, normal work of breathing GI: soft, nontender, nondistended, + BS MS: no deformity or atrophy  Skin: warm and dry, device site well healed Neuro:  Strength and sensation are intact Psych: euthymic mood, full affect  EKG:  EKG is ordered today. Personal review of the ekg ordered shows sinus rhythm, rate 68  Personal review of the device interrogation today. Results in [place]: 01/28/2020: ALT 28; TSH 2.77 05/19/2020: B Natriuretic Peptide 51.8 06/26/2020: Hemoglobin 14.5; Platelets 197 08/04/2020: Magnesium 2.2 08/13/2020: BUN 28; Creatinine, Ser 1.25; Potassium 4.0; Sodium 140    Lipid Panel      Component Value Date/Time   CHOL 151 04/10/2019 0801   CHOL 116 10/16/2016 0850   TRIG 160.0 (H) 04/10/2019 0801   HDL 50.80 04/10/2019 0801   HDL 42 10/16/2016 0850   CHOLHDL 3 04/10/2019 0801   VLDL 32.0 04/10/2019 0801   LDLCALC 68 04/10/2019 0801   LDLCALC 46 10/16/2016 0850     Wt Readings from Last 3 Encounters:  10/07/20 260 lb 6.4 oz (118.1 kg)  10/05/20 265 lb 12.8 oz (120.6 kg)  09/21/20 266 lb (120.7 kg)      Other studies Reviewed: Additional studies/ records that were reviewed today include: TTE 03/28/16  Review of the above records today demonstrates:  - Left ventricle: The cavity size was normal. Wall thickness was   increased in a pattern of mild LVH. Systolic function was normal.   The estimated ejection fraction was in the range of 55% to 60%.   Indeterminant diastolic function (atrial fibrillation). Wall   motion was normal; there were no regional wall motion   abnormalities. - Aortic valve: There was no stenosis. - Mitral valve: Mildly calcified annulus. There was no significant   regurgitation. - Left atrium: The atrium was mildly dilated. - Right ventricle: The cavity size was normal. Pacer wire or   catheter noted in right ventricle. Systolic function was normal. - Tricuspid valve: Peak RV-RA gradient (S): 22 mm Hg. - Pulmonary arteries: PA peak pressure: 30 mm Hg (S). - Systemic veins: IVC measured 2.3 cm with normal respirophasic   variation, suggesting RA pressure 8 mmHg.  LHC/RHC 05/09/17 1.  Single-vessel coronary artery disease with continued patency of the stented segment in the right coronary artery and chronic occlusion of a small acute marginal branch collateralized by the left coronary artery 2.  Patent left main, LAD, and left circumflex without significant stenosis 3.  Vigorous LV systolic function with mildly elevated LVEDP 4.  Severe pulmonary hypertension with mean pulmonary artery pressure of 48 mmHg.  Because of high cardiac output  (8.5  L/min), the patient's pulmonary vascular resistance is only mildly increased at 3 [place] units.  ASSESSMENT AND PLAN:  1.  Persistent atrial fibrillation/flutter: Status post ablation x3.  Currently on Eliquis with a CHA2DS2-VASc of 3.  Has been started on dofetilide.  High risk medication monitoring.  She did go back into atrial flutter and is now status post ablation 07/07/2020.  She has a 3.2% atrial fibrillation burden.  She currently feels well and has no complaints.  She is happy with her control.  No changes.  2.  Hypertension: Currently well controlled  3.  Obesity: Diet and weight loss encouraged  4.  Chronic diastolic heart failure: No obvious volume overload  5.  Obstructive sleep apnea: CPAP compliance encouraged  6.  Sinus node dysfunction: Status post [employer] Jude dual-chamber pacemaker.  Device functioning appropriately.  No changes at this time.    Current medicines are reviewed at length with the patient today.   The patient does not have concerns regarding her medicines.  The following changes were made today: None  Labs/ tests ordered today include:  Orders Placed This Encounter  Procedures  . EKG 12-Lead     Disposition:   FU with Kaliah Haddaway 3 months  Signed, [place] Pelto Meredith Leeds, MD  10/07/2020 10:45 AM     [facility] [address] [place] [address] [phone] (office) [phone] (fax)

## 2020-10-08 ENCOUNTER — Ambulatory Visit
Admission: RE | Admit: 2020-10-08 | Discharge: 2020-10-08 | Disposition: A | Discharge status: Home / Self Care | Payer: PPO | Source: Ambulatory Visit | Attending: Internal Medicine | Admitting: Internal Medicine

## 2020-10-08 DIAGNOSIS — J129 Viral pneumonia, unspecified: Secondary | ICD-10-CM

## 2020-10-08 DIAGNOSIS — Z8709 Personal history of other diseases of the respiratory system: Secondary | ICD-10-CM

## 2020-10-08 DIAGNOSIS — R0609 Other forms of dyspnea: Secondary | ICD-10-CM | POA: Diagnosis not present

## 2020-10-08 DIAGNOSIS — I2510 Atherosclerotic heart disease of native coronary artery without angina pectoris: Secondary | ICD-10-CM | POA: Diagnosis not present

## 2020-10-08 DIAGNOSIS — Z8619 Personal history of other infectious and parasitic diseases: Secondary | ICD-10-CM

## 2020-10-08 DIAGNOSIS — M47814 Spondylosis without myelopathy or radiculopathy, thoracic region: Secondary | ICD-10-CM | POA: Diagnosis not present

## 2020-10-08 DIAGNOSIS — R0600 Dyspnea, unspecified: Secondary | ICD-10-CM

## 2020-10-08 DIAGNOSIS — J84112 Idiopathic pulmonary fibrosis: Secondary | ICD-10-CM | POA: Diagnosis not present

## 2020-10-11 DIAGNOSIS — C44722 Squamous cell carcinoma of skin of right lower limb, including hip: Secondary | ICD-10-CM | POA: Diagnosis not present

## 2020-10-11 DIAGNOSIS — L812 Freckles: Secondary | ICD-10-CM | POA: Diagnosis not present

## 2020-10-11 DIAGNOSIS — D225 Melanocytic nevi of trunk: Secondary | ICD-10-CM | POA: Diagnosis not present

## 2020-10-11 DIAGNOSIS — L820 Inflamed seborrheic keratosis: Secondary | ICD-10-CM | POA: Diagnosis not present

## 2020-10-11 DIAGNOSIS — L821 Other seborrheic keratosis: Secondary | ICD-10-CM | POA: Diagnosis not present

## 2020-10-11 DIAGNOSIS — D1801 Hemangioma of skin and subcutaneous tissue: Secondary | ICD-10-CM | POA: Diagnosis not present

## 2020-10-11 DIAGNOSIS — D2261 Melanocytic nevi of right upper limb, including shoulder: Secondary | ICD-10-CM | POA: Diagnosis not present

## 2020-10-11 DIAGNOSIS — D485 Neoplasm of uncertain behavior of skin: Secondary | ICD-10-CM | POA: Diagnosis not present

## 2020-10-11 DIAGNOSIS — Z85828 Personal history of other malignant neoplasm of skin: Secondary | ICD-10-CM | POA: Diagnosis not present

## 2020-10-11 NOTE — Progress Notes (Signed)
High-resolution CT chest shows improvement reflecting improvement from viral infection.  Please ensure follow-up in 5-6 months as per the office visit planned in March 2022.  Please note that the request was for the CT chest to be done in 6 months from the March visit.  In any event CT chest currently shows improvement

## 2020-10-23 ENCOUNTER — Other Ambulatory Visit: Payer: Self-pay | Admitting: Internal Medicine

## 2020-11-06 ENCOUNTER — Other Ambulatory Visit: Payer: Self-pay | Admitting: Cardiovascular Disease

## 2020-11-08 ENCOUNTER — Other Ambulatory Visit: Payer: Self-pay | Admitting: Cardiology

## 2020-11-12 ENCOUNTER — Telehealth: Payer: Self-pay | Admitting: Pharmacist

## 2020-11-12 NOTE — Chronic Care Management (AMB) (Signed)
Chronic Care Management Pharmacy Assistant   Name: [name]  [facility]: [id number] DOB: Aug 22, 1945  Reason for Encounter: General Adherence Call       Recent office visits:  . 04.05.2022 [name], [name], MD (PCP) follow- up visit . 02.21.2022 [name], [name], MD (PCP) follow- up visit o Medication change o Empagliflozin 25 mg Oral Daily before breakfast . 01.30.2022 Koberlein, Junell C, MD patient seen for acute visit cerumen impaction  o Medication prescribed o Neomycin-Polymyxin-HC 1 % 3 drops Left EAR 4 times daily . 01.10.2022 [name], [name], MD patient present for hospitalization follow up. o Medication changes o Empagliflozin 10 mg Oral Daily before breakfast o Metformin HCl 500 mg Oral 2 times daily with meals, Increase to 3 per day and then 4 per day o Dapagliflozin Propanediol 5 mg Oral Daily before breakfast (not covered by patient insurance) o Benzonatate 100 mg Oral 3 times daily PRN (Completed Course) o Cholecalciferol 5,000 Units Oral Daily at bedtime (Patient Preference) o Cyanocobalamin 5,000 mcg Oral Daily at bedtime (Patient Preference) . 12.06.2021 Laurey Morale, MD the patient was present for hospital follow up o Medications prescribed o Nystatin 500,000 Units Oral 4 times daily  Recent consult visits:  . 04.07.2022 Constance Haw, MD Cardiology patient present for electrophysiology evaluation. . 02.02.2022 Sherran Needs, NP Cardiology- follow-up visit  . 02.02.2022 Beckie Salts, MD ENT patient seen for initial console about cerumen impaction . 01.13.2022 Jarome Matin Dermatology- patient seen for follow-up  Hospital visits:  Medication Reconciliation was completed by comparing discharge summary, patient's EMR and Pharmacy list, and upon discussion with patient.  Admitted to the hospital on 01.05.2022 due to Atrial Fibrillation Ablation. Discharge date was 01.05.2022. Discharged from [place]?Medications Started at  [facility] Discharge:?? -started None  Medication Changes at Hospital Discharge: -Changed None  Medications Discontinued at Hospital Discharge: -Stopped None  Medications that remain the same after Hospital Discharge:??  -All other medications will remain the same.    Hospital visits:  Medication Reconciliation was completed by comparing discharge summary, patient's EMR and Pharmacy list, and upon discussion with patient.  Admitted to the hospital on 12.25.2021 due to Dizziness. Discharge date was 12.25.2021. Discharged from [place]?Medications Started at [facility] Discharge:?? -started none  Medication Changes at Hospital Discharge: -Changed Metformin 500 mg tablet  times daily with meals  Medications Discontinued at Hospital Discharge: -Stopped none  Medications that remain the same after Hospital Discharge:??  -All other medications will remain the same.     Medications: Outpatient Encounter Medications as of 11/12/2020  Medication Sig  . Ascorbic Acid (VITAMIN C) 1000 MG tablet Take 1,000 mg by mouth at bedtime.   [name] atorvastatin (LIPITOR) 40 MG tablet TAKE 1 TABLET BY MOUTH EVERY DAY  . Calcium Carbonate-Vitamin D (CALCIUM 600+D PO) Take 1 tablet by mouth daily.   [name] diltiazem (CARDIZEM LA) 360 MG 24 hr tablet Take 1 tablet (360 mg total) by mouth daily.  [name] dofetilide (TIKOSYN) 250 MCG capsule TAKE ONE CAPSULE BY MOUTH TWICE A DAY  . DULoxetine (CYMBALTA) 60 MG capsule TAKE 1 CAPSULE BY MOUTH EVERY DAY  . ELIQUIS 5 MG TABS tablet TAKE 1 TABLET BY MOUTH TWICE A DAY (Patient taking differently: Take 5 mg by mouth 2 (two) times daily.)  . empagliflozin (JARDIANCE) 25 MG TABS tablet Take 1 tablet (25 mg total) by mouth daily before breakfast.  . fluticasone (FLONASE)  50 MCG/ACT nasal spray Place 1 spray into both nostrils at bedtime.  [name] KLOR-CON M20 20 MEQ tablet TAKE 1 TABLET (20 MEQ TOTAL) BY MOUTH 2 (TWO) TIMES DAILY. MAY TAKE AN EXTRA TABLET (20  MG) DAILY AS NEEDED FOR LEG CRAMPS.  [name] loratadine (CLARITIN) 10 MG tablet TAKE 1 TABLET BY MOUTH EVERY DAY  . Lysine 1000 MG TABS Take 1,000 mg by mouth at bedtime.   . metFORMIN (GLUCOPHAGE) 500 MG tablet TAKE 1 TABLET (500 MG TOTAL) BY MOUTH 2 (TWO) TIMES DAILY WITH A MEAL. INCREASE TO 3 PER DAY AND THEN 4 PER DAY  . metolazone (ZAROXOLYN) 2.5 MG tablet TAKE 1 TABLET (2.5 MG TOTAL) BY MOUTH EVERY 4 DAYS AS NEEDED FOR SWELLING  . metoprolol tartrate (LOPRESSOR) 50 MG tablet TAKE 1 TABLET BY MOUTH TWICE A DAY  . Multiple Vitamin (MULTIVITAMIN WITH MINERALS) TABS tablet Take 1 tablet by mouth daily. Centrum  . nitroGLYCERIN (NITROSTAT) 0.4 MG SL tablet PLACE 1 TABLET (0.4 MG TOTAL) UNDER THE TONGUE EVERY 5 (FIVE) MINUTES X 3 DOSES AS NEEDED FOR CHEST PAIN.  [name] nystatin-triamcinolone (MYCOLOG II) cream Apply 1 application topically 2 (two) times daily as needed. (Patient taking differently: Apply 1 application topically 2 (two) times daily as needed (rash).)  . Polyethyl Glycol-Propyl Glycol (SYSTANE) 0.4-0.3 % SOLN Place 1 drop into both eyes at bedtime.  . torsemide (DEMADEX) 20 MG tablet TAKE 2 TABLETS (40 MG TOTAL) BY MOUTH 2 (TWO) TIMES DAILY.  . valACYclovir (VALTREX) 1000 MG tablet Take 2 tablets (2,000 mg total) by mouth every 12 (twelve) hours as needed (fever blisters/cold sores.).   No facility-administered encounter medications on file as of 11/12/2020.   I spoke to the patient about medication adherence. She stated that she was having some issues with nausea on and off. She was unsure of which medication was the cause. A new CCM appointment was made for 05.27.2022 at 9. She states she has had a few changes to her medication in the past couple of months and would talk to the pharmacist. She states that she is not experiencing any side effects from her current medication. There have been no urgent care or emergency department visits since her last PCP. The patient states she is taking  medication as prescribed. She is no issues with her current pharmacy.  Star Rating Drugs:   Dispensed Quantity Pharmacy  Jardiance 25 mg [facility]17.2022 [facility]  Metformin 500 mg [facility]16.2022 [facility]  Atorvastatin 40 mg 03.11.2022 [facility]   Amilia Revonda Standard, [place] Pharmacist Assistant [phone]

## 2020-11-14 ENCOUNTER — Other Ambulatory Visit: Payer: Self-pay | Admitting: Cardiovascular Disease

## 2020-11-22 ENCOUNTER — Other Ambulatory Visit: Payer: Self-pay | Admitting: Cardiology

## 2020-11-23 MED ORDER — DILTIAZEM HCL ER COATED BEADS 360 MG PO TB24: 360.0000 mg | ORAL_TABLET | Freq: Every day | ORAL | 3 refills | Status: DC

## 2020-11-24 DIAGNOSIS — G4733 Obstructive sleep apnea (adult) (pediatric): Secondary | ICD-10-CM | POA: Diagnosis not present

## 2020-11-25 ENCOUNTER — Telehealth: Payer: Self-pay | Admitting: Pharmacist

## 2020-11-25 NOTE — Chronic Care Management (AMB) (Signed)
Date- Patient called to remind of appointment with Watt Climes on 11/26/2020 at 9:00  am  Patient aware of appointment date, time, and type of appointment (either telephone or in person). Patient aware to have/bring all medications, supplements, blood pressure and/or blood sugar logs to visit.  Questions: Have you had any recent office visit or specialist visit outside of [place]? No Are there any concerns you would like to discuss during your office visit?  Some issues with nausea  Are you having any problems obtaining your medications? (Whether it pharmacy issues or cost) No  Star Rating Drug:  Dispensed Quantity Pharmacy  Jardiance 25 mg [facility]17[facility]2022 [facility]  Metformin 500 mg [facility]16.2022 [facility]  Atorvastatin 40 mg [facility]11.2022 [facility]    Any gaps in medications fill history?  Maia Breslow, [place] Pharmacist Assistant [phone]

## 2020-11-26 ENCOUNTER — Ambulatory Visit (INDEPENDENT_AMBULATORY_CARE_PROVIDER_SITE_OTHER): Payer: PPO | Admitting: Pharmacist

## 2020-11-26 DIAGNOSIS — I5032 Chronic diastolic (congestive) heart failure: Secondary | ICD-10-CM

## 2020-11-26 DIAGNOSIS — E1165 Type 2 diabetes mellitus with hyperglycemia: Secondary | ICD-10-CM | POA: Diagnosis not present

## 2020-11-26 NOTE — Patient Instructions (Signed)
Hi [name],  It was great speaking with you again! Just as a reminder, go ahead and alternate different tims of the day with checking your blood sugars so we have a better picture of what is going on after meals and keep a log of the sugars. Also, start taking your metformin immediately after eating to see if this helps with your upset stomach/stomach pain.  I also placed the healthy plate handout in the mail so hopefully you should receive that within the next week or two.   Please reach out to me if you have any questions or need anything before our follow up!  Best, Maddie  [name], PharmD, [facility]  Visit Information  Goals Addressed   None    Patient Care Plan: [facility] Pharmacy Care Plan    Problem Identified: Problem: Hypertension, Hyperlipidemia, Diabetes, Atrial Fibrillation, Heart Failure, Coronary Artery Disease, Anxiety and Allergic Rhinitis     Long-Range Goal: Patient-Specific Goal   Start Date: 11/26/2020  Expected End Date: 11/26/2021  This Visit's Progress: On track  Priority: High  Note:   Current Barriers:  . Unable to maintain control of diabetes  Pharmacist Clinical Goal(s):  [name] Patient will achieve adherence to monitoring guidelines and medication adherence to achieve therapeutic efficacy . achieve control of diabetes as evidenced by home blood sugar readings and A1c  through collaboration with PharmD and provider.   Interventions: . 1:1 collaboration with [name], [name], MD regarding development and update of comprehensive plan of care as evidenced by provider attestation and co-signature . Inter-disciplinary care team collaboration (see longitudinal plan of care) . Comprehensive medication review performed; medication list updated in electronic medical record  Hypertension (BP goal <130/80) -Not ideally controlled -Current treatment:  Diltiazem (Cardizem CD) 360mg , 1 capsule once daily    Metoprolol tartrate 50 mg 1 tablet twice daily -Medications previously tried: indapamide, losartan -Current home readings: did not provide -Current dietary habits: did not discuss -Current exercise habits: limited due to shortness of breath -Denies hypotensive/hypertensive symptoms -Educated on Importance of home blood pressure monitoring; Proper BP monitoring technique; -Counseled to monitor BP at home weekly, document, and provide log at future appointments -Counseled on diet and exercise extensively Recommended to continue current medication  Hyperlipidemia: (LDL goal < 70) -Controlled -Current treatment: . Atorvastatin 40mg , 1 tablet once daily -Medications previously tried: none  -Current dietary patterns: did not discuss -Current exercise habits: limited due to shortness of breath -Educated on Cholesterol goals;  Importance of limiting foods high in cholesterol; -Counseled on diet and exercise extensively Recommended to continue current medication Recommended repeat lipid panel as patient is overdue  CAD (Goal: prevent heart attacks and strokes) -Controlled -Current treatment   Atorvastatin 40mg , 1 tablet daily   Nitroglycerin 0.4mg  SL tablet, place 1 tablet under tongue every five minutes for 3 doses as needed for chest pain  -Medications previously tried: none  -Recommended to continue current medication   Diabetes (A1c goal <7%) -Not ideally controlled -Current medications: [name] Metformin 500 mg 2 tablets twice daily . Jardiance 25 mg 1 tablet daily -Medications previously tried: [place] (cost)  -Current home glucose readings . fasting glucose: 198, 201, 197, 205 . post prandial glucose: n/a -Denies hypoglycemic/hyperglycemic symptoms -Current meal patterns:  . breakfast: premier protein shake with a few sesame sticks  . lunch: did not discuss  . dinner: steak and potatoes . snacks: did not discuss . drinks: did not discuss -Current exercise: limited with  shortness  of breath -Educated on A1c and blood sugar goals; Benefits of routine self-monitoring of blood sugar; Carbohydrate counting and/or plate method -Counseled to check feet daily and get yearly eye exams -Counseled on diet and exercise extensively Recommended to continue current medication Counseled on importance of taking metformin immediately after eating to help with stomach upset  Atrial Fibrillation (Goal: prevent stroke and major bleeding) -Controlled -CHADSVASC: 6 -Current treatment: . Rate control: Diltiazem (Cardizem CD) 360mg , 1 capsule once daily, dofetilide 250 mcg 1 capsule twice daily  Anticoagulation: Eliquis 5mg , 1 tablet twice daily  -Medications previously tried: dronedarone (patient reported retaining fluid), amiodarone, dofetilide, sotalol  -Home BP and HR readings: did not discuss  -Counseled on importance of adherence to anticoagulant exactly as prescribed; avoidance of NSAIDs due to increased bleeding risk with anticoagulants; -Recommended to continue current medication  Heart Failure (Goal: manage symptoms and prevent exacerbations) -Controlled -Last ejection fraction: 60-65% (Date: 05/20/2020) -HF type: Diastolic -NYHA Class: III (marked limitation of activity) -AHA HF Stage: C (Heart disease and symptoms present) -Current treatment:  Torsemide 20mg , 2 tablets twice daily   Klor-con 43mEq, 1 tablet twice daily  Metoprolol tartrate 50 mg 1 tablet twice daily  Metolazone 2.5 mg 1 tablet every 4 days as needed -Medications previously tried: n/a  -Current home BP/HR readings: did not discuss -Current dietary habits: limiting salt intake -Current exercise habits: limited due to shortness of breath -Educated on Importance of weighing daily; if you gain more than 3 pounds in one day or 5 pounds in one week, call cardiologist -Recommended to continue current medication  Stress/Anxiety (Goal: minimize symptoms) -Controlled -Current  treatment: . Duloxetine 60mg ,1 capsule once daily -Medications previously tried/failed: fluoxetine -PHQ9: 0 -GAD7: n/a -Educated on Benefits of medication for symptom control -Recommended to continue current medication  Allergic rhinitis (Goal: minimize symptoms) -Controlled -Current treatment   Fluticasone (Flonase) 75mcg/ act nasal spray, 1 spray into both nostrils daily   Loratadine 10mg ,1 tablet once daily -Medications previously tried: none  -Recommended to continue current medication  GERD (Goal: minimize symptoms) -Controlled -Current treatment  . Omeprazole 20mg , 1 capsule once daily before breakfast -Medications previously tried: none  -Recommended to continue current medication Patient is taking for stomach pain and reports it is helping  Bone health (Goal prevent fractures) -Controlled -Last DEXA Scan: 04/08/2014  T-Score femoral neck: 0.85  T-Score total hip: n/a  T-Score lumbar spine: n/a  T-Score forearm radius: n/a  10-year probability of major osteoporotic fracture: 5.9%  10-year probability of hip fracture: 0.2% -Patient is not a candidate for pharmacologic treatment -Current treatment   Calcium carbonate/ vitamin D, 1 tablet at bedtime  Cholecalciferol (163mcg)  5000 units, 1 tablet every evening -Medications previously tried: none  -Recommend [phone] units of vitamin D daily. Recommend 1200 mg of calcium daily from dietary and supplemental sources. Recommend weight-bearing and muscle strengthening exercises for building and maintaining bone density. -Recommended to continue current medication  Health Maintenance -Vaccine gaps: Shingrix -Current therapy:   Ascorbic acid 1000mg , 1 tablet once daily  APAP 500mg , 2 in the AM, and 2 in the PM   Diphenhydramine/ APAP, 25-500mg , 2 tablets at bedtime   Lysine 1000mg , 1 tablet at bedtime   Multivitamin, 1 tablet once daily   Systane, 1 drop into both eyes twice daily as needed  Valacyclovir  1000mg , 2 tablet every twelve hours as needed for fever blisters/ cold sores   Vitamin B12 1049mcg, 1 tablet once daily  Vitamin E (670mg ) 1000 units, 1 capsule  at bedtime  -Educated on Cost vs benefit of each product must be carefully weighed by individual consumer -Patient is satisfied with current therapy and denies issues -Recommended to continue current medication  Patient Goals/Self-Care Activities . Patient will:  - take medications as prescribed check glucose daily, document, and provide at future appointments engage in dietary modifications by following healthy plate model  Follow Up Plan: Telephone follow up appointment with care management team member scheduled for:1 month       Patient verbalizes understanding of instructions provided today and agrees to view in [place].  Telephone follow up appointment with pharmacy team member scheduled for: 1 month  Viona Gilmore, [facility]

## 2020-11-26 NOTE — Progress Notes (Signed)
Chronic Care Management Pharmacy Note  11/26/2020 Name:  [name] MRN:  [id number] DOB:  1945-07-21  Subjective: [name] is an 75 y.o. year old female who is a primary patient of [name], [name], MD.  The CCM team was consulted for assistance with disease management and care coordination needs.    Engaged with patient by telephone for follow up visit in response to provider referral for pharmacy case management and/or care coordination services.   Consent to Services:  The patient was given information about Chronic Care Management services, agreed to services, and gave verbal consent prior to initiation of services.  Please see initial visit note for detailed documentation.   Patient Care Team: [name], [name], MD as PCP - General Curt Bears, Ocie Doyne, MD as PCP - Electrophysiology (Cardiology) Nahser, Wonda Cheng, MD as PCP - Cardiology (Cardiology) Gaynelle Arabian, MD as Consulting Physician (Orthopedic Surgery) Brand Males, MD as Consulting Physician (Pulmonary Disease) Viona Gilmore, [facility] as Pharmacist (Pharmacist)  Recent office visits:  04.05.2022 [name], MD: Presented for diabetes follow- up. A1c improved to 7.1%.   02.21.2022 [name], [name], MD: Presented for DM follow up. Increased Jardiance to 25 mg daily.   01.30.2022 Caren Macadam, MD patient seen for acute visit cerumen impaction. Prescribed neomycin-Polymyxin-HC 1 % 3 drops Left EAR 4 times daily.   01.10.2022 [name], [name], MD patient presented for hospitalization follow up. Prescribed Jardiance 10 mg daily, metformin HCl 500 mg Oral 2 times daily with meals, Increase to 3 per day and then 4 per day.   12.06.2021 Laurey Morale, MD the patient presented for hospital follow up. Prescribed nystatin 500,000 Units Oral 4 times daily.  Recent consult visits:  04.07.2022 Constance Haw, MD Cardiology patient present for electrophysiology evaluation.   02.02.2022 Sherran Needs, NP Cardiology- follow-up visit.   02.02.2022 Beckie Salts, MD ENT patient seen for initial consult for cerumen impaction.   01.13.2022 Jarome Matin Dermatology- patient seen for follow-up. Unable to access notes.   Hospital visits: Medication Reconciliation was completed by comparing discharge summary, patient's EMR and Pharmacy list, and upon discussion with patient.  Admitted to the hospital on 01.05.2022 due to Atrial Fibrillation Ablation. Discharge date was 01.05.2022. Discharged from [place]?Medications Started at [facility] Discharge:?? -started None  Medication Changes at Hospital Discharge: -Changed None  Objective:  Lab Results  Component Value Date   CREATININE 1.25 (H) 08/13/2020   BUN 28 (H) 08/13/2020   GFR 50.24 (L) 04/10/2019   GFRNONAA 45 (L) 08/13/2020   GFRAA 40 (L) 07/28/2020   NA 140 08/13/2020   K 4.0 08/13/2020   CALCIUM 9.9 08/13/2020   CO2 25 08/13/2020   GLUCOSE 247 (H) 08/13/2020    Lab Results  Component Value Date/Time   HGBA1C 7.1 (A) 10/05/2020 10:27 AM   HGBA1C 8.1 (H) 05/19/2020 07:46 PM   HGBA1C 6.8 (H) 01/28/2020 12:01 PM   GFR 50.24 (L) 04/10/2019 08:01 AM   GFR 52.39 (L) 06/24/2018 12:57 PM   MICROALBUR <0.7 01/23/2017 10:51 AM    Last diabetic Eye exam:  Lab Results  Component Value Date/Time   HMDIABEYEEXA [place] 07/03/2009 12:00 AM    Last diabetic Foot exam: No results found for: HMDIABFOOTEX   Lab Results  Component Value Date   CHOL 151 04/10/2019   HDL 50.80 04/10/2019   LDLCALC 68 04/10/2019   TRIG 160.0 (H) 04/10/2019   CHOLHDL 3  04/10/2019    Hepatic Function Latest Ref Rng & Units 01/28/2020 04/10/2019 06/24/2018  Total Protein 6.1 - 8.1 g/dL 7.5 7.3 -  Albumin 3.5 - 5.2 g/dL - 4.4 -  AST 10 - 35 U/L 33 38(H) -  ALT 6 - 29 U/L 28 40(H) -  Alk Phosphatase 39 - 117 U/L - 125(H) 130(H)  Total Bilirubin 0.2 - 1.2 mg/dL 0.5 0.8 -  Bilirubin, Direct 0.0 - 0.2 mg/dL 0.1  0.2 -    Lab Results  Component Value Date/Time   TSH 2.77 01/28/2020 12:01 PM   TSH 3.48 04/10/2019 08:01 AM   FREET4 0.83 05/06/2018 09:23 AM   FREET4 0.82 07/24/2016 01:39 PM    CBC Latest Ref Rng & Units 06/26/2020 06/21/2020 05/28/2020  WBC 4.0 - 10.5 K/uL 6.3 9.6 9.6  Hemoglobin 12.0 - 15.0 g/dL 14.5 16.1(H) 14.6  Hematocrit 36.0 - 46.0 % 44.3 46.5 45.2  Platelets 150 - 400 K/uL 197 202 290    Lab Results  Component Value Date/Time   VD25OH 32.83 01/23/2017 10:51 AM    Clinical ASCVD: No  The 10-year ASCVD risk score Mikey Bussing DC Jr., et al., 2013) is: 36.8%   Values used to calculate the score:     Age: 54 years     Sex: Female     Is Non-Hispanic African American: No     Diabetic: Yes     Tobacco smoker: No     Systolic Blood Pressure: 395 mmHg     Is BP treated: Yes     HDL Cholesterol: 50.8 mg/dL     Total Cholesterol: 151 mg/dL    Depression screen [facility] 2/9 11/17/2019 05/06/2018 05/15/2017  Decreased Interest 0 0 0  Down, Depressed, Hopeless 0 0 0  PHQ - 2 Score 0 0 0  Some recent data might be hidden     CHA2DS2/VAS Stroke Risk Points  Current as of about an hour ago     6 >= 2 Points: High Risk  1 - 1.99 Points: Medium Risk  0 Points: Low Risk    Last Change: N/A      Details    This score determines the patient's risk of having a stroke if the  patient has atrial fibrillation.       Points Metrics  1 Has Congestive Heart Failure:  Yes    Current as of about an hour ago  1 Has Vascular Disease:  Yes    Current as of about an hour ago  1 Has Hypertension:  Yes    Current as of about an hour ago  1 Age:  69    Current as of about an hour ago  1 Has Diabetes:  Yes    Current as of about an hour ago  0 Had Stroke:  No  Had TIA:  No  Had Thromboembolism:  No    Current as of about an hour ago  1 Female:  Yes    Current as of about an hour ago     Social History   Tobacco Use  Smoking Status Former Smoker  . Packs/day: 1.00  . Years: 5.00   . Pack years: 5.00  . Types: Cigarettes  . Start date: 05/11/1978  . Quit date: 07/03/1982  . Years since quitting: 38.4  Smokeless Tobacco Never Used   BP Readings from Last 3 Encounters:  10/07/20 138/62  10/05/20 130/70  09/21/20 128/62   Pulse Readings from Last 3 Encounters:  10/07/20 68  10/05/20 65  09/21/20 68   Wt Readings from Last 3 Encounters:  10/07/20 260 lb 6.4 oz (118.1 kg)  10/05/20 265 lb 12.8 oz (120.6 kg)  09/21/20 266 lb (120.7 kg)   BMI Readings from Last 3 Encounters:  10/07/20 42.03 kg/m  10/05/20 42.90 kg/m  09/21/20 42.93 kg/m    Assessment/Interventions: Review of patient past medical history, allergies, medications, health status, including review of consultants reports, laboratory and other test data, was performed as part of comprehensive evaluation and provision of chronic care management services.   SDOH:  (Social Determinants of Health) assessments and interventions performed: No  SDOH Screenings   Alcohol Screen: Not on file  Depression (JGO1-1): Not on file  Financial Resource Strain: Not on file  Food Insecurity: Not on file  Housing: Not on file  Physical Activity: Not on file  Social Connections: Not on file  Stress: Not on file  Tobacco Use: Medium Risk  . Smoking Tobacco Use: Former Smoker  . Smokeless Tobacco Use: Never Used  Transportation Needs: No Transportation Needs  . Lack of Transportation (Medical): No  . Lack of Transportation (Non-Medical): No   Patient's stomach doesn't feel very settled and reports it doesn't hurt but doesn't feel settled. Patient is having some constipation but takes a stool softener and then has some diarrhea. Patient tries to avoid constipation with her history of hemorrhoids.   Metformin taking before breakfast - recommended to eat immediately after breakfast  Have been drinking a premier protein shake for breakfast - protein; can add cheese toast to protein shake  Looked at labels -  [place] protein bar   Last night - hamburger steak and small baked potato  -send mail in healthy plate  CCM Care Plan  Allergies  Allergen Reactions  . Hydrocodone-Guaifenesin Other (See Comments)    Confusion/ memory loss  . Adhesive [Tape] Rash and Other (See Comments)    Allergic to defibrillation pads.  . Pedi-Pre Tape Spray [Wound Dressing Adhesive] Other (See Comments) and Rash    Allergic to defibrillation pads.    Medications Reviewed Today    Reviewed by Constance Haw, MD (Physician) on 10/07/20 at 1045  Med List Status: <None>  Medication Order Taking? Sig Documenting Provider Last Dose Status Informant  Ascorbic Acid (VITAMIN C) 1000 MG tablet [id number] Yes Take 1,000 mg by mouth at bedtime.  [provider] Taking Active Self  atorvastatin (LIPITOR) 40 MG tablet [id number] Yes TAKE 1 TABLET BY MOUTH EVERY DAY Camnitz, Will Hassell Done, MD Taking Active   Calcium Carbonate-Vitamin D (CALCIUM 600+D PO) [id number] Yes Take 1 tablet by mouth daily.  [provider] Taking Active Self  diltiazem (CARDIZEM LA) 360 MG 24 hr tablet [id number] Yes Take 1 tablet (360 mg total) by mouth daily. Constance Haw, MD Taking Active   dofetilide (TIKOSYN) 250 MCG capsule [id number] Yes TAKE ONE CAPSULE BY MOUTH TWICE A DAY Sherran Needs, NP Taking Active   DULoxetine (CYMBALTA) 60 MG capsule [id number] Yes TAKE 1 CAPSULE BY MOUTH EVERY DAY [name], [name], MD Taking Active   ELIQUIS 5 MG TABS tablet [id number] Yes TAKE 1 TABLET BY MOUTH TWICE A DAY  Patient taking differently: Take 5 mg by mouth 2 (two) times daily.   Constance Haw, MD Taking Active   empagliflozin (JARDIANCE) 25 MG TABS tablet [id number] Yes Take 1 tablet (25 mg total) by mouth daily before breakfast. [name], [name], MD Taking Active   fluticasone (FLONASE) 50 MCG/ACT  nasal spray [id number] Yes Place 1 spray into both nostrils at bedtime. [provider] Taking Active Self   KLOR-CON M20 20 MEQ tablet [id number] Yes TAKE 1 TABLET (20 MEQ TOTAL) BY MOUTH 2 (TWO) TIMES DAILY. MAY TAKE AN EXTRA TABLET (20 MG) DAILY AS NEEDED FOR LEG CRAMPS. Nahser, Wonda Cheng, MD Taking Active   loratadine (CLARITIN) 10 MG tablet [id number] Yes TAKE 1 TABLET BY MOUTH EVERY DAY [name], [name], MD Taking Active   Lysine 1000 MG TABS [id number] Yes Take 1,000 mg by mouth at bedtime.  [provider] Taking Active Self  metFORMIN (GLUCOPHAGE) 500 MG tablet [id number] Yes TAKE 1 TABLET (500 MG TOTAL) BY MOUTH 2 (TWO) TIMES DAILY WITH A MEAL. INCREASE TO 3 PER DAY AND THEN 4 PER DAY [name], [name], MD Taking Active   metolazone (ZAROXOLYN) 2.5 MG tablet [id number] Yes Take 1 tablet (2.5 mg total) by mouth every 4 days AS NEEDED for swelling  Patient taking differently: Take 2.5 mg by mouth See admin instructions. Take 1 tablet (2.5 mg total) by mouth every 4 days as needed for swelling/overnight weight gain   Constance Haw, MD Taking Active            Med Note Orvan Seen, Sharlette Dense   Sat Jun 26, 2020 10:43 AM) Today was the 1st dose in 2 weeks  metoprolol tartrate (LOPRESSOR) 50 MG tablet [id number] Yes TAKE 1 TABLET BY MOUTH TWICE A DAY [name], [name], MD Taking Active   Multiple Vitamin (MULTIVITAMIN WITH MINERALS) TABS tablet [id number] Yes Take 1 tablet by mouth daily. Centrum [provider] Taking Active Self  nitroGLYCERIN (NITROSTAT) 0.4 MG SL tablet [id number] Yes PLACE 1 TABLET (0.4 MG TOTAL) UNDER THE TONGUE EVERY 5 (FIVE) MINUTES X 3 DOSES AS NEEDED FOR CHEST PAIN. Nahser, Wonda Cheng, MD Taking Active Self  nystatin-triamcinolone [facility] II) cream [id number] Yes Apply 1 application topically 2 (two) times daily as needed.  Patient taking differently: Apply 1 application topically 2 (two) times daily as needed (rash).   [name], [name], MD Taking Active   Polyethyl Glycol-Propyl Glycol (SYSTANE) 0.4-0.3 % SOLN [id number] Yes Place 1 drop into both eyes at bedtime.  [provider] Taking Active Self  torsemide (DEMADEX) 20 MG tablet [id number] Yes TAKE 2 TABLETS (40 MG TOTAL) BY MOUTH 2 (TWO) TIMES DAILY. Nahser, Wonda Cheng, MD Taking Active   valACYclovir (VALTREX) 1000 MG tablet [id number] Yes Take 2 tablets (2,000 mg total) by mouth every 12 (twelve) hours as needed (fever blisters/cold sores.). [name], [name], MD Taking Active Self          Patient Active Problem List   Diagnosis Date Noted  . Type 2 diabetes mellitus with hyperlipidemia ([place]) 05/22/2020  . Acute respiratory failure ([place]) 05/19/2020  . Hypokalemia 05/19/2020  . Secondary hypercoagulable state ([place]) 02/16/2020  . CHF exacerbation ([place]) 05/31/2018  . Bronchitis 05/31/2018  . Primary osteoarthritis of left knee 04/23/2017  . Degenerative arthritis of left knee 04/19/2017  . Atypical atrial flutter ([place]) 09/30/2016  . Hematoma 09/30/2016  . Hypotension 09/30/2016  . Persistent atrial fibrillation ([place]) 09/29/2016  . Hyperlipidemia   . HLD (hyperlipidemia)   . Controlled type 2 diabetes mellitus without complication ([place])   . Acute respiratory failure with hypoxia ([place]) 03/26/2016  . DDD (degenerative disc disease), lumbar 04/05/2015  . Degenerative disc disease, lumbar 04/05/2015  . Severe obesity (BMI >= 40) ([place]) 02/16/2015  . Fatty liver disease, nonalcoholic 01/77/9390  . Cough,  persistent 04/03/2014  . Acute on chronic diastolic congestive heart failure ([place]) 10/07/2013  . Pre-diabetes 09/24/2013  . Peripheral edema 08/05/2013  . Neoplasm of uncertain behavior of skin of back 11/09/2012  . Edema 11/04/2012  . Bleeding mole 11/04/2012  . Pulmonary hypertension ([place]) 06/11/2012  . Pneumonia 06/05/2012  . Hypoxia 06/05/2012  . Anemia 06/05/2012  . Hemorrhoids 05/11/2012  . Dyspepsia 05/11/2012  . Medication management 04/20/2011  . Positional vertigo 10/07/2010  . Anticoagulant long-term use   . HERPES LABIALIS 12/30/2009  . INTERTRIGO 12/30/2009  .  Obesity 06/02/2009  . Obstructive sleep apnea 10/12/2008  . HYPERLIPIDEMIA 09/15/2008  . SNORING 09/15/2008  . Essential hypertension 06/19/2008  . CAD S/P percutaneous coronary angioplasty 06/19/2008  . Paroxysmal atrial fibrillation ([place]) 06/19/2008  . BRADYCARDIA-TACHYCARDIA SYNDROME 06/19/2008  . CEREBRAL ANEURYSM 06/19/2008  . Allergic rhinitis 06/19/2008  . HYPERGLYCEMIA 06/19/2008  . DEPRESSION, HX OF 06/19/2008  . CEREBROVASCULAR ACCIDENT, HX OF 06/19/2008  . PACEMAKER, PERMANENT 06/19/2008    Immunization History  Administered Date(s) Administered  . Fluad Quad(high Dose 65+) 04/10/2019  . Influenza Split 04/18/2011, 05/06/2012  . Influenza Whole 07/04/2007, 06/02/2009, 03/01/2010  . Influenza, High Dose Seasonal PF 04/03/2014, 04/18/2016, 04/05/2017, 05/06/2018  . Influenza,inj,Quad PF,6+ Mos 03/07/2013, 04/06/2015  . PFIZER(Purple Top)SARS-COV-2 Vaccination 07/22/2019, 08/10/2019, 03/29/2020  . Pneumococcal Conjugate-13 09/24/2013, 04/20/2020  . Pneumococcal Polysaccharide-23 07/03/2005, 07/04/2007, 04/18/2011  . Td 06/02/2009  . Zoster, Live 07/03/2006    Conditions to be addressed/monitored:  Hypertension, Hyperlipidemia, Diabetes, Atrial Fibrillation, Heart Failure, Coronary Artery Disease, Anxiety and Allergic Rhinitis  Care Plan : CCM Pharmacy Care Plan  Updates made by Viona Gilmore, [place] since 11/26/2020 12:00 AM    Problem: Problem: Hypertension, Hyperlipidemia, Diabetes, Atrial Fibrillation, Heart Failure, Coronary Artery Disease, Anxiety and Allergic Rhinitis     Long-Range Goal: Patient-Specific Goal   Start Date: 11/26/2020  Expected End Date: 11/26/2021  This Visit's Progress: On track  Priority: High  Note:   Current Barriers:  . Unable to maintain control of diabetes  Pharmacist Clinical Goal(s):  [name] Patient will achieve adherence to monitoring guidelines and medication adherence to achieve therapeutic efficacy . achieve control of diabetes as  evidenced by home blood sugar readings and A1c  through collaboration with PharmD and provider.   Interventions: . 1:1 collaboration with [name], [name], MD regarding development and update of comprehensive plan of care as evidenced by provider attestation and co-signature . Inter-disciplinary care team collaboration (see longitudinal plan of care) . Comprehensive medication review performed; medication list updated in electronic medical record  Hypertension (BP goal <130/80) -Not ideally controlled -Current treatment:  Diltiazem (Cardizem CD) 323m, 1 capsule once daily   Metoprolol tartrate 50 mg 1 tablet twice daily -Medications previously tried: indapamide, losartan -Current home readings: did not provide -Current dietary habits: did not discuss -Current exercise habits: limited due to shortness of breath -Denies hypotensive/hypertensive symptoms -Educated on Importance of home blood pressure monitoring; Proper BP monitoring technique; -Counseled to monitor BP at home weekly, document, and provide log at future appointments -Counseled on diet and exercise extensively Recommended to continue current medication  Hyperlipidemia: (LDL goal < 70) -Controlled -Current treatment: . Atorvastatin 453m 1 tablet once daily -Medications previously tried: none  -Current dietary patterns: did not discuss -Current exercise habits: limited due to shortness of breath -Educated on Cholesterol goals;  Importance of limiting foods high in cholesterol; -Counseled on diet and exercise extensively Recommended to continue current medication Recommended repeat lipid panel as patient is  overdue  CAD (Goal: prevent heart attacks and strokes) -Controlled -Current treatment   Atorvastatin 28m, 1 tablet daily   Nitroglycerin 0.417mSL tablet, place 1 tablet under tongue every five minutes for 3 doses as needed for chest pain  -Medications previously tried: none  -Recommended to continue current  medication   Diabetes (A1c goal <7%) -Not ideally controlled -Current medications: . Marland Kitchenetformin 500 mg 2 tablets twice daily . Jardiance 25 mg 1 tablet daily -Medications previously tried: FaIrancost)  -Current home glucose readings . fasting glucose: 198, 201, 197, 205 . post prandial glucose: n/a -Denies hypoglycemic/hyperglycemic symptoms -Current meal patterns:  . breakfast: premier protein shake with a few sesame sticks  . lunch: did not discuss  . dinner: steak and potatoes . snacks: did not discuss . drinks: did not discuss -Current exercise: limited with shortness of breath -Educated on A1c and blood sugar goals; Benefits of routine self-monitoring of blood sugar; Carbohydrate counting and/or plate method -Counseled to check feet daily and get yearly eye exams -Counseled on diet and exercise extensively Recommended to continue current medication Counseled on importance of taking metformin immediately after eating to help with stomach upset  Atrial Fibrillation (Goal: prevent stroke and major bleeding) -Controlled -CHADSVASC: 6 -Current treatment: . Rate control: Diltiazem (Cardizem CD) 36040m1 capsule once daily, dofetilide 250 mcg 1 capsule twice daily  Anticoagulation: Eliquis 5mg75m tablet twice daily  -Medications previously tried: dronedarone (patient reported retaining fluid), amiodarone, dofetilide, sotalol  -Home BP and HR readings: did not discuss  -Counseled on importance of adherence to anticoagulant exactly as prescribed; avoidance of NSAIDs due to increased bleeding risk with anticoagulants; -Recommended to continue current medication  Heart Failure (Goal: manage symptoms and prevent exacerbations) -Controlled -Last ejection fraction: 60-65% (Date: 05/20/2020) -HF type: Diastolic -NYHA Class: III (marked limitation of activity) -AHA HF Stage: C (Heart disease and symptoms present) -Current treatment:  Torsemide 20mg43mtablets twice daily    Klor-con 20mEq69mtablet twice daily  Metoprolol tartrate 50 mg 1 tablet twice daily  Metolazone 2.5 mg 1 tablet every 4 days as needed -Medications previously tried: n/a  -Current home BP/HR readings: did not discuss -Current dietary habits: limiting salt intake -Current exercise habits: limited due to shortness of breath -Educated on Importance of weighing daily; if you gain more than 3 pounds in one day or 5 pounds in one week, call cardiologist -Recommended to continue current medication  Stress/Anxiety (Goal: minimize symptoms) -Controlled -Current treatment: . Duloxetine 60mg,119msule once daily -Medications previously tried/failed: fluoxetine -PHQ9: 0 -GAD7: n/a -Educated on Benefits of medication for symptom control -Recommended to continue current medication  Allergic rhinitis (Goal: minimize symptoms) -Controlled -Current treatment   Fluticasone (Flonase) 50mcg/ 71mnasal spray, 1 spray into both nostrils daily   Loratadine 10mg,1 t71mt once daily -Medications previously tried: none  -Recommended to continue current medication  GERD (Goal: minimize symptoms) -Controlled -Current treatment  . Omeprazole 20mg, 1 c35mle once daily before breakfast -Medications previously tried: none  -Recommended to continue current medication Patient is taking for stomach pain and reports it is helping  Bone health (Goal prevent fractures) -Controlled -Last DEXA Scan: 04/08/2014  T-Score femoral neck: 0.85  T-Score total hip: n/a  T-Score lumbar spine: n/a  T-Score forearm radius: n/a  10-year probability of major osteoporotic fracture: 5.9%  10-year probability of hip fracture: 0.2% -Patient is not a candidate for pharmacologic treatment -Current treatment   Calcium carbonate/ vitamin D, 1 tablet at bedtime  Cholecalciferol (125mcg)46m  5000 units, 1 tablet every evening -Medications previously tried: none  -Recommend [phone] units of vitamin D daily. Recommend  1200 mg of calcium daily from dietary and supplemental sources. Recommend weight-bearing and muscle strengthening exercises for building and maintaining bone density. -Recommended to continue current medication  Health Maintenance -Vaccine gaps: Shingrix -Current therapy:   Ascorbic acid 1064m, 1 tablet once daily  APAP 5080m 2 in the AM, and 2 in the PM   Diphenhydramine/ APAP, 25-50081m2 tablets at bedtime   Lysine 1000m4m tablet at bedtime   Multivitamin, 1 tablet once daily   Systane, 1 drop into both eyes twice daily as needed  Valacyclovir 1000mg53mtablet every twelve hours as needed for fever blisters/ cold sores   Vitamin B12 1000mcg78mtablet once daily  Vitamin E (670mg) 24m units, 1 capsule at bedtime  -Educated on Cost vs benefit of each product must be carefully weighed by individual consumer -Patient is satisfied with current therapy and denies issues -Recommended to continue current medication  Patient Goals/Self-Care Activities . Patient will:  - take medications as prescribed check glucose daily, document, and provide at future appointments engage in dietary modifications by following healthy plate model  Follow Up Plan: Telephone follow up appointment with care management team member scheduled for:1 month      Medication Assistance: None required.  Patient affirms current coverage meets needs.  Compliance/Adherence/Medication fill history: Care Gaps: None  Star-Rating Drugs: Jardiance 25 mg - last filled 11/16/20 for 30 ds Metformin 500 mg - last filled 10/16/20 for 90 ds Atorvastatin 40 mg - last filled 09/10/20 for 90 ds  Patient's preferred pharmacy is:  [id number] - [id number], [place] - [phone] [phone]-[place] [facility]fielde[facility] [phone] box? Yes - patient uses weekly pill box for 2 weeks Pt endorses 100% compliance  We discussed: Current pharmacy is preferred with insurance plan and patient is satisfied with pharmacy services Patient decided to: Continue current medication management strategy  Care Plan and Follow Up Patient Decision:  Patient agrees to Care Plan and Follow-up.  Plan: Telephone follow up appointment with care management team member scheduled for:  1 month  MadelineJeni Salles BCACP ClYalobushaist [place]-[phone]

## 2020-11-30 ENCOUNTER — Other Ambulatory Visit: Payer: Self-pay

## 2020-11-30 ENCOUNTER — Ambulatory Visit (INDEPENDENT_AMBULATORY_CARE_PROVIDER_SITE_OTHER): Payer: PPO

## 2020-11-30 VITALS — BP 130/64 | HR 74 | Temp 98.5°F | Wt 264.6 lb

## 2020-11-30 DIAGNOSIS — Z0000 Encounter for general adult medical examination without abnormal findings: Secondary | ICD-10-CM | POA: Diagnosis not present

## 2020-11-30 DIAGNOSIS — Z1211 Encounter for screening for malignant neoplasm of colon: Secondary | ICD-10-CM | POA: Diagnosis not present

## 2020-11-30 NOTE — Progress Notes (Signed)
Subjective:   [name] is a 74 y.o. female who presents for Medicare Annual (Subsequent) preventive examination.  Review of Systems     Cardiac Risk Factors include: advanced age (>67men, >78 women);diabetes mellitus;dyslipidemia;hypertension;obesity (BMI >30kg/m2)     Objective:    Today's Vitals   11/30/20 0838  BP: 130/64  Pulse: 74  Temp: 98.5 F (36.9 C)  SpO2: 94%  Weight: 264 lb 9.6 oz (120 kg)   Body mass index is 42.71 kg/m.  Advanced Directives 11/30/2020 07/07/2020 06/26/2020 05/20/2020 02/17/2020 01/22/2020 01/22/2020  Does Patient Have a Medical Advance Directive? Yes Yes No Yes Yes No No  Type of Paramedic of [place];Living will - Healthcare Power of [place];Living will - -  Does patient want to make changes to medical advance directive? - No - Patient declined - No - Patient declined No - Patient declined - -  Copy of [place] in Chart? No - copy requested No - copy requested - - No - copy requested - -  Would patient like information on creating a medical advance directive? - - No - Patient declined - - No - Patient declined No - Patient declined  Pre-existing out of facility DNR order (yellow form or pink MOST form) - - - - - - -    Current Medications (verified) Outpatient Encounter Medications as of 11/30/2020  Medication Sig  . Ascorbic Acid (VITAMIN C) 1000 MG tablet Take 1,000 mg by mouth at bedtime.   [name] atorvastatin (LIPITOR) 40 MG tablet TAKE 1 TABLET BY MOUTH EVERY DAY  . Calcium Carbonate-Vitamin D (CALCIUM 600+D PO) Take 1 tablet by mouth daily.   [name] diltiazem (CARDIZEM LA) 360 MG 24 hr tablet Take 1 tablet (360 mg total) by mouth daily.  [name] dofetilide (TIKOSYN) 250 MCG capsule TAKE ONE CAPSULE BY MOUTH TWICE A DAY  . DULoxetine (CYMBALTA) 60 MG capsule TAKE 1 CAPSULE BY MOUTH EVERY DAY  . ELIQUIS 5 MG TABS tablet TAKE 1 TABLET BY MOUTH TWICE A DAY  (Patient taking differently: Take 5 mg by mouth 2 (two) times daily.)  . empagliflozin (JARDIANCE) 25 MG TABS tablet Take 1 tablet (25 mg total) by mouth daily before breakfast.  . fluticasone (FLONASE) 50 MCG/ACT nasal spray Place 1 spray into both nostrils at bedtime.  [name] KLOR-CON M20 20 MEQ tablet TAKE 1 TABLET (20 MEQ TOTAL) BY MOUTH 2 (TWO) TIMES DAILY. MAY TAKE AN EXTRA TABLET (20 MG) DAILY AS NEEDED FOR LEG CRAMPS.  [name] loratadine (CLARITIN) 10 MG tablet TAKE 1 TABLET BY MOUTH EVERY DAY  . Lysine 1000 MG TABS Take 1,000 mg by mouth at bedtime.   . metFORMIN (GLUCOPHAGE) 500 MG tablet TAKE 1 TABLET (500 MG TOTAL) BY MOUTH 2 (TWO) TIMES DAILY WITH A MEAL. INCREASE TO 3 PER DAY AND THEN 4 PER DAY  . metolazone (ZAROXOLYN) 2.5 MG tablet TAKE 1 TABLET (2.5 MG TOTAL) BY MOUTH EVERY 4 DAYS AS NEEDED FOR SWELLING  . metoprolol tartrate (LOPRESSOR) 50 MG tablet TAKE 1 TABLET BY MOUTH TWICE A DAY  . Multiple Vitamin (MULTIVITAMIN WITH MINERALS) TABS tablet Take 1 tablet by mouth daily. Centrum  . nitroGLYCERIN (NITROSTAT) 0.4 MG SL tablet Place 1 tablet (0.4 mg total) under the tongue every 5 (five) minutes x 3 doses as needed for chest pain.  [name] nystatin-triamcinolone (MYCOLOG II) cream Apply 1 application topically 2 (two) times daily as needed. (Patient taking  differently: Apply 1 application topically 2 (two) times daily as needed (rash).)  . omeprazole (PRILOSEC) 20 MG capsule Take 20 mg by mouth daily.  Vladimir Faster Glycol-Propyl Glycol (SYSTANE) 0.4-0.3 % SOLN Place 1 drop into both eyes at bedtime.  . torsemide (DEMADEX) 20 MG tablet TAKE 2 TABLETS (40 MG TOTAL) BY MOUTH 2 (TWO) TIMES DAILY.  . valACYclovir (VALTREX) 1000 MG tablet Take 2 tablets (2,000 mg total) by mouth every 12 (twelve) hours as needed (fever blisters/cold sores.).   No facility-administered encounter medications on file as of 11/30/2020.    Allergies (verified) Hydrocodone-guaifenesin, Adhesive [tape], and Pedi-pre tape spray  [wound dressing adhesive]   History: Past Medical History:  Diagnosis Date  . Anticoagulant long-term use    pradaxa  . Anxiety   . Arthritis    "fingers, lower back" (04/23/2017   . CAD (coronary artery disease) 9233,0076   post PTCA with bare-metal stenting to mid RCA in December 2004     . CHF (congestive heart failure) ([place])   . Chronic atrial fibrillation ([place]) 06/2007   Tachybradycardia pacemaker  . Chronic kidney disease    10% function - ?R, other kidney is compensating    . CVA (cerebral vascular accident) [facility]) [phone]   denies residual on 04/23/2017  . Depression   . Diplopia 06/19/2008   Qualifier: Diagnosis of  By: [name] MD, [name]   . Dysrhythmia    ATRIAL FIBRILATION  . Edema of lower extremity   . Hyperlipidemia   . Hypertension   . Inferior myocardial infarction [facility])    acute inferior wall mi/other medical hx  . Myocardial infarction ([place]) [id number]  . Obesity   . OSA on CPAP    last test- 2010  . Pacemaker   . Pneumonia 2014   tx. ----  [facility]  . Pulmonary hypertension ([place])    moderate pulmonary hypertension by 10/2016 echo and 10/2013 cardiac cath  . Shortness of breath   . Skin cancer    "cut off right Sensing; burned off LLE" (04/23/2017)  . Sleep apnea   . Spondylolisthesis   . TIA (transient ischemic attack) 2008  . Unspecified hemorrhoids without mention of complication 5/62/5638   Colonoscopy--Dr. Carlean Purl    Past Surgical History:  Procedure Laterality Date  . ABDOMINAL HYSTERECTOMY    . APPENDECTOMY  1984  . ATRIAL FIBRILLATION ABLATION N/A 09/29/2016   Procedure: Atrial Fibrillation Ablation;  Surgeon: Will Meredith Leeds, MD;  Location: [place] CV LAB;  Service: Cardiovascular;  Laterality: N/A;  . ATRIAL FIBRILLATION ABLATION N/A 02/07/2018   Procedure: ATRIAL FIBRILLATION ABLATION;  Surgeon: Constance Haw, MD;  Location: [place] CV LAB;  Service: Cardiovascular;  Laterality: N/A;  . ATRIAL FIBRILLATION  ABLATION N/A 03/13/2019   Procedure: ATRIAL FIBRILLATION ABLATION;  Surgeon: Constance Haw, MD;  Location: [place] CV LAB;  Service: Cardiovascular;  Laterality: N/A;  . ATRIAL FIBRILLATION ABLATION N/A 07/07/2020   Procedure: ATRIAL FIBRILLATION ABLATION;  Surgeon: Constance Haw, MD;  Location: [place] CV LAB;  Service: Cardiovascular;  Laterality: N/A;  . BACK SURGERY    . CARDIOVERSION N/A 09/12/2017   Procedure: CARDIOVERSION;  Surgeon: Jerline Pain, MD;  Location: [facility] ENDOSCOPY;  Service: Cardiovascular;  Laterality: N/A;  . CARDIOVERSION N/A 12/13/2017   Procedure: CARDIOVERSION;  Surgeon: Sanda Klein, MD;  Location: [place] ENDOSCOPY;  Service: Cardiovascular;  Laterality: N/A;  . CARDIOVERSION N/A 02/18/2018   Procedure: CARDIOVERSION;  Surgeon: Dorothy Spark, MD;  Location: [facility]  ENDOSCOPY;  Service: Cardiovascular;  Laterality: N/A;  . CARDIOVERSION N/A 05/20/2018   Procedure: CARDIOVERSION;  Surgeon: [name], MD;  Location: [facility] ENDOSCOPY;  Service: Cardiovascular;  Laterality: N/A;  . CARDIOVERSION N/A 04/16/2019   Procedure: CARDIOVERSION;  Surgeon: Donato Heinz, MD;  Location: [place];  Service: Endoscopy;  Laterality: N/A;  . CARDIOVERSION N/A 01/22/2020   Procedure: CARDIOVERSION;  Surgeon: Geralynn Rile, MD;  Location: [place];  Service: Cardiovascular;  Laterality: N/A;  . CARDIOVERSION N/A 02/18/2020   Procedure: CARDIOVERSION;  Surgeon: Elouise Munroe, MD;  Location: [facility] ENDOSCOPY;  Service: Cardiovascular;  Laterality: N/A;  . CATARACT EXTRACTION W/ INTRAOCULAR LENS  IMPLANT, BILATERAL Bilateral 01/15/2017- 03/2017  . CHOLECYSTECTOMY    . CORONARY ANGIOPLASTY  X 2  . CORONARY ANGIOPLASTY WITH STENT PLACEMENT  1998; ~ 2007; ?date   "1 stent; replaced stent; not sure when I got the last stent" (04/23/2017)  . DOPPLER ECHOCARDIOGRAPHY  2009  . ELECTROPHYSIOLOGIC STUDY N/A 03/31/2016   Procedure: Cardioversion;  Surgeon:  Evans Lance, MD;  Location: [place] CV LAB;  Service: Cardiovascular;  Laterality: N/A;  . ELECTROPHYSIOLOGIC STUDY N/A 08/04/2016   Procedure: Cardioversion;  Surgeon: Evans Lance, MD;  Location: [place] CV LAB;  Service: Cardiovascular;  Laterality: N/A;  . INSERT / REPLACE / REMOVE PACEMAKER  06/2007  . IR RADIOLOGY PERIPHERAL GUIDED IV START  01/31/2018  . IR US GUIDE VASC ACCESS LEFT  01/31/2018  . JOINT REPLACEMENT    . LAPAROSCOPIC CHOLECYSTECTOMY  1994  . LEFT AND RIGHT HEART CATHETERIZATION WITH CORONARY ANGIOGRAM N/A 10/06/2013   Procedure: LEFT AND RIGHT HEART CATHETERIZATION WITH CORONARY ANGIOGRAM;  Surgeon: Troy Sine, MD;  Location: [facility] CATH LAB;  Service: Cardiovascular;  Laterality: N/A;  . LEFT OOPHORECTOMY Left ~ 1989  . POSTERIOR LUMBAR FUSION  2000s - 04/2015 X 3   L3-4; L4-5; L2-3; Dr Trenton Gammon  . RIGHT/LEFT HEART CATH AND CORONARY ANGIOGRAPHY N/A 05/09/2017   Procedure: RIGHT/LEFT HEART CATH AND CORONARY ANGIOGRAPHY;  Surgeon: Sherren Mocha, MD;  Location: [place] CV LAB;  Service: Cardiovascular;  Laterality: N/A;  . SKIN CANCER EXCISION Right    [place]  . TEE WITHOUT CARDIOVERSION N/A 09/29/2016   Procedure: TRANSESOPHAGEAL ECHOCARDIOGRAM (TEE);  Surgeon: Jerline Pain, MD;  Location: [place];  Service: Cardiovascular;  Laterality: N/A;  . TOTAL ABDOMINAL HYSTERECTOMY  1984   "uterus & right ovary"  . TOTAL KNEE ARTHROPLASTY Left 04/23/2017  . TOTAL KNEE ARTHROPLASTY Left 04/23/2017   Procedure: TOTAL KNEE ARTHROPLASTY;  Surgeon: Frederik Pear, MD;  Location: [place];  Service: Orthopedics;  Laterality: Left;  . ULTRASOUND GUIDANCE FOR VASCULAR ACCESS  05/09/2017   Procedure: Ultrasound Guidance For Vascular Access;  Surgeon: Sherren Mocha, MD;  Location: [place] CV LAB;  Service: Cardiovascular;;   Family History  Problem Relation Age of Onset  . Suicidality Father        suicide death pt was 3 yrs, 41  . Arrhythmia Mother   . Hypertension  Mother   . Diabetes Mother   . Dementia Mother   . Heart attack Brother   . Heart disease Paternal Aunt   . Prostate cancer Maternal Grandfather   . Diabetes Paternal Grandfather        fathers side of the family  . Colon cancer Maternal Aunt    Social History   Socioeconomic History  . Marital status: Married    Spouse name: Not on file  . Number  of children: 0  . Years of education: HS  . Highest education level: Not on file  Occupational History  . Occupation: retired    Comment: previously worked [employer]  . Smoking status: Former Smoker    Packs/day: 1.00    Years: 5.00    Pack years: 5.00    Types: Cigarettes    Start date: 05/11/1978    Quit date: 07/03/1982    Years since quitting: 38.4  . Smokeless tobacco: Never Used  Vaping Use  . Vaping Use: Never used  Substance and Sexual Activity  . Alcohol use: Yes    Comment: 04/23/2017 "once q 6 months; glass of wine"  . Drug use: No  . Sexual activity: Not Currently  Other Topics Concern  . 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   Married    Originally from [place] [place] of two, high school education   Former smoker [phone] 1ppd   Hunting dogs 7    Retired from [employer] 2    Social Determinants of Radio broadcast assistant Strain: [place]   . Difficulty of Paying Living Expenses: Not hard at all  Food Insecurity: No Food Insecurity  . Worried About Charity fundraiser in the Last Year: Never true  . Ran Out of Food in the Last Year: Never true  Transportation Needs: No Transportation Needs  . Lack of Transportation (Medical): No  . Lack of Transportation (Non-Medical): No  Physical Activity: Unknown  . Days of Exercise per Week: Not on file  . Minutes of Exercise per Session: 20 min  Stress: Stress Concern Present  . Feeling of Stress : To some extent  Social Connections: Socially  Integrated  . Frequency of Communication with Friends and Family: More than three times a week  . Frequency of Social Gatherings with Friends and Family: More than three times a week  . Attends Religious Services: 1 to 4 times per year  . Active Member of Clubs or Organizations: Yes  . Attends Archivist Meetings: 1 to 4 times per year  . Marital Status: Married    Tobacco Counseling Counseling given: Not Answered   Clinical Intake:  Pre-visit preparation completed: Yes  Pain : No/denies pain     BMI - recorded: 42.71 Nutritional Status: BMI > 30  Obese Nutritional Risks: None Diabetes: Yes CBG done?: Yes (198) CBG resulted in Enter/ Edit results?: No Did pt. bring in CBG monitor from home?: No  How often do you need to have someone help you when you read instructions, pamphlets, or other written materials from your doctor or pharmacy?: 1 - Never  Diabetic?Nutrition Risk Assessment:  Has the patient had any N/V/D within the last 2 months?  No  Does the patient have any non-healing wounds?  No  Has the patient had any unintentional weight loss or weight gain?  No   Diabetes:  Is the patient diabetic?  Yes  If diabetic, was a CBG obtained today?  Yes  Did the patient bring in their glucometer from home?  No  How often do you monitor your CBG's? Daily.   Financial Strains and Diabetes Management:  Are you having any financial strains with the device, your supplies or your medication? No .  Does the patient want to be seen by Chronic Care Management for management of their  diabetes?  No  Would the patient like to be referred to a Nutritionist or for Diabetic Management?  No   Diabetic Exams:  Diabetic Eye Exam: Overdue for diabetic eye exam. Pt has been advised about the importance in completing this exam. Patient advised to call and schedule an eye exam. Diabetic Foot Exam: Overdue, Pt has been advised about the importance in completing this exam. Pt is  scheduled for diabetic foot exam on 01/28/20.   Interpreter Needed?: No  Information entered by :: Charlott Rakes, LPM   Activities of Daily Living In your present state of health, do you have any difficulty performing the following activities: 11/30/2020 05/20/2020  Hearing? N N  Vision? N N  Difficulty concentrating or making decisions? N N  Walking or climbing stairs? Y N  Comment uses a cane -  Dressing or bathing? N N  Doing errands, shopping? N N  Preparing Food and eating ? N -  Using the Toilet? N -  In the past six months, have you accidently leaked urine? Y -  Comment wears pads in panties -  Do you have problems with loss of bowel control? N -  Managing your Medications? N -  Housekeeping or managing your Housekeeping? N -  Some recent data might be hidden    Patient Care Team: [name], [name], MD as PCP - General Curt Bears, Ocie Doyne, MD as PCP - Electrophysiology (Cardiology) Nahser, Wonda Cheng, MD as PCP - Cardiology (Cardiology) Gaynelle Arabian, MD as Consulting Physician (Orthopedic Surgery) Brand Males, MD as Consulting Physician (Pulmonary Disease) Viona Gilmore, [facility] as Pharmacist (Pharmacist)  Indicate any recent Medical Services you may have received from other than [facility] providers in the past year (date may be approximate).     Assessment:   This is a routine wellness examination for [name].  Hearing/Vision screen  Hearing Screening   125Hz  250Hz  500Hz  1000Hz  2000Hz  3000Hz  4000Hz  6000Hz  8000Hz   Right ear:           Left ear:           Comments: Pt denies any hearing issues   Vision Screening Comments: Pt follows up with Dr Katy Fitch for annual eye exams  Dietary issues and exercise activities discussed: Current Exercise Habits: Home exercise routine, Type of exercise: walking, Time (Minutes): 20, Frequency (Times/Week): 3, Weekly Exercise (Minutes/Week): 60  Goals Addressed            This Visit's Progress   . Patient Stated       Lose  weight       Depression Screen PHQ 2/9 Scores 11/30/2020 11/17/2019 05/06/2018 05/15/2017 01/23/2017 10/17/2016 06/11/2015  PHQ - 2 Score 1 0 0 0 0 0 0    Fall Risk Fall Risk  11/30/2020 06/07/2020 09/04/2018 05/06/2018 05/15/2017  Falls in the past year? 0 1 0 0 No  Number falls in past yr: 0 0 0 - -  Injury with Fall? 0 0 0 - -  Risk for fall due to : Impaired vision - - - -  Follow up Falls prevention discussed - Falls evaluation completed - -    FALL RISK PREVENTION PERTAINING TO THE HOME:  Any stairs in or around the home? Yes  If so, are there any without handrails? No  Home free of loose throw rugs in walkways, pet beds, electrical cords, etc? Yes  Adequate lighting in your home to reduce risk of falls? Yes   ASSISTIVE DEVICES UTILIZED TO PREVENT FALLS:  Life  alert? Yes  Use of a cane, walker or w/c? Yes  Grab bars in the bathroom? Yes  Shower chair or bench in shower? Yes  Elevated toilet seat or a handicapped toilet? Yes   TIMED UP AND GO:  Was the test performed? Yes .  Length of time to ambulate 10 feet: 10 sec.   Gait slow and steady without use of assistive device  Cognitive Function: MMSE - Mini Mental State Exam 11/22/2016  Not completed: (No Data)     6CIT Screen 11/30/2020  What Year? 0 points  What month? 0 points  What time? 0 points  Count back from 20 0 points  Months in reverse 0 points  Repeat phrase 2 points  Total Score 2    Immunizations Immunization History  Administered Date(s) Administered  . Fluad Quad(high Dose 65+) 04/10/2019  . Influenza Split 04/18/2011, 05/06/2012  . Influenza Whole 07/04/2007, 06/02/2009, 03/01/2010  . Influenza, High Dose Seasonal PF 04/03/2014, 04/18/2016, 04/05/2017, 05/06/2018  . Influenza,inj,Quad PF,6+ Mos 03/07/2013, 04/06/2015  . PFIZER(Purple Top)SARS-COV-2 Vaccination 07/22/2019, 08/10/2019, 03/29/2020  . Pneumococcal Conjugate-13 09/24/2013, 04/20/2020  . Pneumococcal Polysaccharide-23 07/03/2005,  07/04/2007, 04/18/2011  . Td 06/02/2009  . Zoster, Live 07/03/2006    TDAP status: Due, Education has been provided regarding the importance of this vaccine. Advised may receive this vaccine at local pharmacy or Health Dept. Aware to provide a copy of the vaccination record if obtained from local pharmacy or Health Dept. Verbalized acceptance and understanding.  Flu Vaccine status: Due, Education has been provided regarding the importance of this vaccine. Advised may receive this vaccine at local pharmacy or Health Dept. Aware to provide a copy of the vaccination record if obtained from local pharmacy or Health Dept. Verbalized acceptance and understanding.  Pneumococcal vaccine status: Up to date  Covid-19 vaccine status: Completed vaccines  Qualifies for Shingles Vaccine? Yes   Zostavax completed Yes   Shingrix Completed?: No.    Education has been provided regarding the importance of this vaccine. Patient has been advised to call insurance company to determine out of pocket expense if they have not yet received this vaccine. Advised may also receive vaccine at local pharmacy or Health Dept. Verbalized acceptance and understanding.  Screening Tests Health Maintenance  Topic Date Due  . Zoster Vaccines- Shingrix (1 of 2) Never done  . COLONOSCOPY (Pts 45-71yrs Insurance coverage will need to be confirmed)  07/04/2015  . OPHTHALMOLOGY EXAM  01/08/2018  . URINE MICROALBUMIN  01/23/2018  . TETANUS/TDAP  06/03/2019  . COVID-19 Vaccine (4 - Booster for Pfizer series) 06/28/2020  . FOOT EXAM  01/27/2021  . INFLUENZA VACCINE  01/31/2021  . HEMOGLOBIN A1C  04/06/2021  . MAMMOGRAM  04/29/2022  . DEXA SCAN  Completed  . Hepatitis C Screening  Completed  . PNA vac Low Risk Adult  Completed  . HPV VACCINES  Aged Out    Health Maintenance  Health Maintenance Due  Topic Date Due  . Zoster Vaccines- Shingrix (1 of 2) Never done  . COLONOSCOPY (Pts 45-84yrs Insurance coverage will need to  be confirmed)  07/04/2015  . OPHTHALMOLOGY EXAM  01/08/2018  . URINE MICROALBUMIN  01/23/2018  . TETANUS/TDAP  06/03/2019  . COVID-19 Vaccine (4 - Booster for Pfizer series) 06/28/2020    Colorectal cancer screening: Referral to GI placed 11/30/20. Pt aware the office will call re: appt.  Mammogram status: Completed 04/29/20. Repeat every year  Bone Density status: Completed 04/08/14. Results reflect: Bone density results: NORMAL. Repeat  every 3-5 years.  Additional Screening:  Hepatitis C Screening:  Completed 01/23/17  Vision Screening: Recommended annual ophthalmology exams for early detection of glaucoma and other disorders of the eye. Is the patient up to date with their annual eye exam?  Yes  Who is the provider or what is the name of the office in which the patient attends annual eye exams? Dr Katy Fitch If pt is not established with a provider, would they like to be referred to a provider to establish care? No .   Dental Screening: Recommended annual dental exams for proper oral hygiene  Community Resource Referral / Chronic Care Management: CRR required this visit?  No   CCM required this visit?  No      Plan:     I have personally reviewed and noted the following in the patient's chart:   . Medical and social history . Use of alcohol, tobacco or illicit drugs  . Current medications and supplements including opioid prescriptions.  . Functional ability and status . Nutritional status . Physical activity . Advanced directives . List of other physicians . Hospitalizations, surgeries, and ER visits in previous 12 months . Vitals . Screenings to include cognitive, depression, and falls . Referrals and appointments  In addition, I have reviewed and discussed with patient certain preventive protocols, quality metrics, and best practice recommendations. A written personalized care plan for preventive services as well as general preventive health recommendations were provided  to patient.     Willette Brace, LPN   [id number]   Nurse Notes: None

## 2020-11-30 NOTE — Patient Instructions (Addendum)
[name] , Thank you for taking time to come for your Medicare Wellness Visit. I appreciate your ongoing commitment to your health goals. Please review the following plan we discussed and let me know if I can assist you in the future.   Screening recommendations/referrals: Colonoscopy: ordered 11/30/20 Mammogram: Done 04/29/20 Bone Density: Done 04/08/14 Recommended yearly ophthalmology/optometry visit for glaucoma screening and checkup Recommended yearly dental visit for hygiene and checkup  Vaccinations: Influenza vaccine: Due in season Pneumococcal vaccine: Up to date Tdap vaccine: Due and discussed Shingles vaccine: Shingrix discussed. Please contact your pharmacy for coverage information.    Covid-19:Completed 1/19, 2/7, & 03/29/20  Advanced directives: Please bring a copy of your health care power of attorney and living will to the office at your convenience.  Conditions/risks identified: Lose weight   Next appointment: Follow up in one year for your annual wellness visit    Preventive Care 65 Years and Older, Female Preventive care refers to lifestyle choices and visits with your health care provider that can promote health and wellness. What does preventive care include?  A yearly physical exam. This is also called an annual well check.  Dental exams once or twice a year.  Routine eye exams. Ask your health care provider how often you should have your eyes checked.  Personal lifestyle choices, including:  Daily care of your teeth and gums.  Regular physical activity.  Eating a healthy diet.  Avoiding tobacco and drug use.  Limiting alcohol use.  Practicing safe sex.  Taking low-dose aspirin every day.  Taking vitamin and mineral supplements as recommended by your health care provider. What happens during an annual well check? The services and screenings done by your health care provider during your annual well check will depend on your age, overall health,  lifestyle risk factors, and family history of disease. Counseling  Your health care provider may ask you questions about your:  Alcohol use.  Tobacco use.  Drug use.  Emotional well-being.  Home and relationship well-being.  Sexual activity.  Eating habits.  History of falls.  Memory and ability to understand (cognition).  Work and work Statistician.  Reproductive health. Screening  You may have the following tests or measurements:  Height, weight, and BMI.  Blood pressure.  Lipid and cholesterol levels. These may be checked every 5 years, or more frequently if you are over 59 years old.  Skin check.  Lung cancer screening. You may have this screening every year starting at age 64 if you have a 30-pack-year history of smoking and currently smoke or have quit within the past 15 years.  Fecal occult blood test (FOBT) of the stool. You may have this test every year starting at age 39.  Flexible sigmoidoscopy or colonoscopy. You may have a sigmoidoscopy every 5 years or a colonoscopy every 10 years starting at age 62.  Hepatitis C blood test.  Hepatitis B blood test.  Sexually transmitted disease (STD) testing.  Diabetes screening. This is done by checking your blood sugar (glucose) after you have not eaten for a while (fasting). You may have this done every 1-3 years.  Bone density scan. This is done to screen for osteoporosis. You may have this done starting at age 80.  Mammogram. This may be done every 1-2 years. Talk to your health care provider about how often you should have regular mammograms. Talk with your health care provider about your test results, treatment options, and if necessary, the need for more tests. Vaccines  Your health care provider may recommend certain vaccines, such as:  Influenza vaccine. This is recommended every year.  Tetanus, diphtheria, and acellular pertussis (Tdap, Td) vaccine. You may need a Td booster every 10 years.  Zoster  vaccine. You may need this after age 66.  Pneumococcal 13-valent conjugate (PCV13) vaccine. One dose is recommended after age 21.  Pneumococcal polysaccharide (PPSV23) vaccine. One dose is recommended after age 70. Talk to your health care provider about which screenings and vaccines you need and how often you need them. This information is not intended to replace advice given to you by your health care provider. Make sure you discuss any questions you have with your health care provider. Document Released: 07/16/2015 Document Revised: 03/08/2016 Document Reviewed: 04/20/2015 Elsevier Interactive Patient Education  2017 [employer] Prevention in the Home Falls can cause injuries. They can happen to people of all ages. There are many things you can do to make your home safe and to help prevent falls. What can I do on the outside of my home?  Regularly fix the edges of walkways and driveways and fix any cracks.  Remove anything that might make you trip as you walk through a door, such as a raised step or threshold.  Trim any bushes or trees on the path to your home.  Use bright outdoor lighting.  Clear any walking paths of anything that might make someone trip, such as rocks or tools.  Regularly check to see if handrails are loose or broken. Make sure that both sides of any steps have handrails.  Any raised decks and porches should have guardrails on the edges.  Have any leaves, snow, or ice cleared regularly.  Use sand or salt on walking paths during winter.  Clean up any spills in your garage right away. This includes oil or grease spills. What can I do in the bathroom?  Use night lights.  Install grab bars by the toilet and in the tub and shower. Do not use towel bars as grab bars.  Use non-skid mats or decals in the tub or shower.  If you need to sit down in the shower, use a plastic, non-slip stool.  Keep the floor dry. Clean up any water that spills on the  floor as soon as it happens.  Remove soap buildup in the tub or shower regularly.  Attach bath mats securely with double-sided non-slip rug tape.  Do not have throw rugs and other things on the floor that can make you trip. What can I do in the bedroom?  Use night lights.  Make sure that you have a light by your bed that is easy to reach.  Do not use any sheets or blankets that are too big for your bed. They should not hang down onto the floor.  Have a firm chair that has side arms. You can use this for support while you get dressed.  Do not have throw rugs and other things on the floor that can make you trip. What can I do in the kitchen?  Clean up any spills right away.  Avoid walking on wet floors.  Keep items that you use a lot in easy-to-reach places.  If you need to reach something above you, use a strong step stool that has a grab bar.  Keep electrical cords out of the way.  Do not use floor polish or wax that makes floors slippery. If you must use wax, use non-skid floor wax.  Do  not have throw rugs and other things on the floor that can make you trip. What can I do with my stairs?  Do not leave any items on the stairs.  Make sure that there are handrails on both sides of the stairs and use them. Fix handrails that are broken or loose. Make sure that handrails are as long as the stairways.  Check any carpeting to make sure that it is firmly attached to the stairs. Fix any carpet that is loose or worn.  Avoid having throw rugs at the top or bottom of the stairs. If you do have throw rugs, attach them to the floor with carpet tape.  Make sure that you have a light switch at the top of the stairs and the bottom of the stairs. If you do not have them, ask someone to add them for you. What else can I do to help prevent falls?  Wear shoes that:  Do not have high heels.  Have rubber bottoms.  Are comfortable and fit you well.  Are closed at the toe. Do not wear  sandals.  If you use a stepladder:  Make sure that it is fully opened. Do not climb a closed stepladder.  Make sure that both sides of the stepladder are locked into place.  Ask someone to hold it for you, if possible.  Clearly mark and make sure that you can see:  Any grab bars or handrails.  First and last steps.  Where the edge of each step is.  Use tools that help you move around (mobility aids) if they are needed. These include:  Canes.  Walkers.  Scooters.  Crutches.  Turn on the lights when you go into a dark area. Replace any light bulbs as soon as they burn out.  Set up your furniture so you have a clear path. Avoid moving your furniture around.  If any of your floors are uneven, fix them.  If there are any pets around you, be aware of where they are.  Review your medicines with your doctor. Some medicines can make you feel dizzy. This can increase your chance of falling. Ask your doctor what other things that you can do to help prevent falls. This information is not intended to replace advice given to you by your health care provider. Make sure you discuss any questions you have with your health care provider. Document Released: 04/15/2009 Document Revised: 11/25/2015 Document Reviewed: 07/24/2014 Elsevier Interactive Patient Education  2017 [employer].

## 2020-12-01 ENCOUNTER — Other Ambulatory Visit: Payer: Self-pay | Admitting: Cardiovascular Disease

## 2020-12-06 ENCOUNTER — Other Ambulatory Visit: Payer: Self-pay | Admitting: Cardiovascular Disease

## 2020-12-13 ENCOUNTER — Other Ambulatory Visit: Payer: Self-pay | Admitting: Internal Medicine

## 2020-12-18 ENCOUNTER — Other Ambulatory Visit: Payer: Self-pay | Admitting: Internal Medicine

## 2020-12-18 ENCOUNTER — Other Ambulatory Visit: Payer: Self-pay | Admitting: Cardiovascular Disease

## 2020-12-20 ENCOUNTER — Telehealth: Payer: Self-pay | Admitting: Pharmacist

## 2020-12-20 NOTE — Telephone Encounter (Signed)
Last OV: 10/05/2020 Last refill: 08/23/2020  Disp:30  R:3 Future OV: 02/08/2021

## 2020-12-20 NOTE — Chronic Care Management (AMB) (Signed)
Date- Patient called to remind of appointment with Watt Climes on 12/21/2020 at 9:00 am   No answer, left message of appointment date, time and type of appointment (telephone ). Left message to have all medications, supplements, blood pressure and/or blood sugar logs available during appointment and to return call if need to reschedule.  Star Rating Drug: Medication Dispensed  Quantity Pharmacy  Metformin 500 mg 04.19.2022 [facility]  Jardiance 25 mg 05.17.2022 [facility]     Any gaps in medications fill history?No    Maia Breslow, [place] Pharmacist Assistant [phone]

## 2020-12-20 NOTE — Progress Notes (Signed)
Chronic Care Management Pharmacy Note  12/21/2020 Name:  [name] MRN:  [id number] DOB:  [birth year]  Summary: A1c not at goal of < 7%   Recommendations/Changes made from today's visit: -Recommended to continue making dietary and lifestyle modifications to lower blood sugars -Recommended alternating checking blood sugars different times of day   Plan: Follow up in 3 months after PCP visit  Subjective: [name] is an 75 y.o. year old female who is a primary patient of [name], [name], MD.  The CCM team was consulted for assistance with disease management and care coordination needs.    Engaged with patient by telephone for follow up visit in response to provider referral for pharmacy case management and/or care coordination services.   Consent to Services:  The patient was given information about Chronic Care Management services, agreed to services, and gave verbal consent prior to initiation of services.  Please see initial visit note for detailed documentation.   Patient Care Team: [name], [name], MD as PCP - General Curt Bears, Ocie Doyne, MD as PCP - Electrophysiology (Cardiology) Nahser, Wonda Cheng, MD as PCP - Cardiology (Cardiology) Gaynelle Arabian, MD as Consulting Physician (Orthopedic Surgery) Brand Males, MD as Consulting Physician (Pulmonary Disease) Viona Gilmore, [facility] as Pharmacist (Pharmacist)  Recent office visits: 11/30/20 Samantha Rakes, LPN: Patient presented for AWV.  04.05.2022 [name], [name], MD: Presented for diabetes follow- up. A1c improved to 7.1%.  02.21.2022 [name], [name], MD: Presented for DM follow up. Increased Jardiance to 25 mg daily.  01.30.2022 Caren Macadam, MD patient seen for acute visit cerumen impaction. Prescribed neomycin-Polymyxin-HC 1 % 3 drops Left EAR 4 times daily.  01.10.2022 [name], [name], MD patient presented for hospitalization follow up. Prescribed Jardiance 10 mg daily, metformin HCl 500 mg Oral 2 times  daily with meals, Increase to 3 per day and then 4 per day.  12.06.2021 Laurey Morale, MD the patient presented for hospital follow up. Prescribed nystatin 500,000 Units Oral 4 times daily.  Recent consult visits: 04.07.2022 Constance Haw, MD Cardiology patient present for electrophysiology evaluation.  02.02.2022 Sherran Needs, NP Cardiology- follow-up visit.  02.02.2022 Beckie Salts, MD ENT patient seen for initial consult for cerumen impaction.  01.13.2022 Jarome Matin Dermatology- patient seen for follow-up. Unable to access notes.   Hospital visits: Medication Reconciliation was completed by comparing discharge summary, patient's EMR and Pharmacy list, and upon discussion with patient.   Admitted to the hospital on 01.05.2022 due to Atrial Fibrillation Ablation. Discharge date was 01.05.2022. Discharged from [facility]?Medications Started at [facility] Discharge:?? -started None   Medication Changes at Hospital Discharge: -Changed None  Objective:  Lab Results  Component Value Date   CREATININE 1.25 (H) 08/13/2020   BUN 28 (H) 08/13/2020   GFR 50.24 (L) 04/10/2019   GFRNONAA 45 (L) 08/13/2020   GFRAA 40 (L) 07/28/2020   NA 140 08/13/2020   K 4.0 08/13/2020   CALCIUM 9.9 08/13/2020   CO2 25 08/13/2020   GLUCOSE 247 (H) 08/13/2020    Lab Results  Component Value Date/Time   HGBA1C 7.1 (A) 10/05/2020 10:27 AM   HGBA1C 8.1 (H) 05/19/2020 07:46 PM   HGBA1C 6.8 (H) 01/28/2020 12:01 PM   GFR 50.24 (L) 04/10/2019 08:01 AM   GFR 52.39 (L) 06/24/2018 12:57 PM   MICROALBUR <0.7 01/23/2017 10:51 AM    Last diabetic Eye exam:  Lab Results  Component Value Date/Time  HMDIABEYEEXA [place] 07/03/2009 12:00 AM    Last diabetic Foot exam: No results found for: HMDIABFOOTEX   Lab Results  Component Value Date   CHOL 151 04/10/2019   HDL 50.80 04/10/2019   LDLCALC 68 04/10/2019   TRIG 160.0 (H) 04/10/2019   CHOLHDL 3 04/10/2019     Hepatic Function Latest Ref Rng & Units 01/28/2020 04/10/2019 06/24/2018  Total Protein 6.1 - 8.1 g/dL 7.5 7.3 -  Albumin 3.5 - 5.2 g/dL - 4.4 -  AST 10 - 35 U/L 33 38(H) -  ALT 6 - 29 U/L 28 40(H) -  Alk Phosphatase 39 - 117 U/L - 125(H) 130(H)  Total Bilirubin 0.2 - 1.2 mg/dL 0.5 0.8 -  Bilirubin, Direct 0.0 - 0.2 mg/dL 0.1 0.2 -    Lab Results  Component Value Date/Time   TSH 2.77 01/28/2020 12:01 PM   TSH 3.48 04/10/2019 08:01 AM   FREET4 0.83 05/06/2018 09:23 AM   FREET4 0.82 07/24/2016 01:39 PM    CBC Latest Ref Rng & Units 06/26/2020 06/21/2020 05/28/2020  WBC 4.0 - 10.5 K/uL 6.3 9.6 9.6  Hemoglobin 12.0 - 15.0 g/dL 14.5 16.1(H) 14.6  Hematocrit 36.0 - 46.0 % 44.3 46.5 45.2  Platelets 150 - 400 K/uL 197 202 290    Lab Results  Component Value Date/Time   VD25OH 32.83 01/23/2017 10:51 AM    Clinical ASCVD: No  The 10-year ASCVD risk score Mikey Bussing DC Jr., et al., 2013) is: 33.4%   Values used to calculate the score:     Age: 3 years     Sex: Female     Is Non-Hispanic African American: No     Diabetic: Yes     Tobacco smoker: No     Systolic Blood Pressure: 518 mmHg     Is BP treated: Yes     HDL Cholesterol: 50.8 mg/dL     Total Cholesterol: 151 mg/dL    Depression screen [facility] 2/9 11/30/2020 11/17/2019 05/06/2018  Decreased Interest 0 0 0  Down, Depressed, Hopeless 1 0 0  PHQ - 2 Score 1 0 0  Some recent data might be hidden     CHA2DS2/VAS Stroke Risk Points  Current as of about an hour ago     6 >= 2 Points: High Risk  1 - 1.99 Points: Medium Risk  0 Points: Low Risk    Last Change: N/A      Details    This score determines the patient's risk of having a stroke if the  patient has atrial fibrillation.       Points Metrics  1 Has Congestive Heart Failure:  Yes    Current as of about an hour ago  1 Has Vascular Disease:  Yes    Current as of about an hour ago  1 Has Hypertension:  Yes    Current as of about an hour ago  1 Age:  43    Current  as of about an hour ago  1 Has Diabetes:  Yes    Current as of about an hour ago  0 Had Stroke:  No  Had TIA:  No  Had Thromboembolism:  No    Current as of about an hour ago  1 Female:  Yes    Current as of about an hour ago     Social History   Tobacco Use  Smoking Status Former   Packs/day: 1.00   Years: 5.00   Pack years: 5.00   Types: Cigarettes  Start date: 05/11/1978   Quit date: 07/03/1982   Years since quitting: 38.4  Smokeless Tobacco Never   BP Readings from Last 3 Encounters:  11/30/20 130/64  10/07/20 138/62  10/05/20 130/70   Pulse Readings from Last 3 Encounters:  11/30/20 74  10/07/20 68  10/05/20 65   Wt Readings from Last 3 Encounters:  11/30/20 264 lb 9.6 oz (120 kg)  10/07/20 260 lb 6.4 oz (118.1 kg)  10/05/20 265 lb 12.8 oz (120.6 kg)   BMI Readings from Last 3 Encounters:  11/30/20 42.71 kg/m  10/07/20 42.03 kg/m  10/05/20 42.90 kg/m    Assessment/Interventions: Review of patient past medical history, allergies, medications, health status, including review of consultants reports, laboratory and other test data, was performed as part of comprehensive evaluation and provision of chronic care management services.   SDOH:  (Social Determinants of Health) assessments and interventions performed: No  SDOH Screenings   Alcohol Screen: Not on file  Depression (PHQ2-9): Low Risk    PHQ-2 Score: 1  Financial Resource Strain: Low Risk    Difficulty of Paying Living Expenses: Not hard at all  Food Insecurity: No Food Insecurity   Worried About Charity fundraiser in the Last Year: Never true   Ran Out of Food in the Last Year: Never true  Housing: Low Risk    Last Housing Risk Score: 0  Physical Activity: Unknown   Days of Exercise per Week: Not on file   Minutes of Exercise per Session: 20 min  Social Connections: Engineer, building services of Communication with Friends and Family: More than three times a week   Frequency of Social  Gatherings with Friends and Family: More than three times a week   Attends Religious Services: 1 to 4 times per year   Active Member of [employer] or Organizations: Yes   Attends Archivist Meetings: 1 to 4 times per year   Marital Status: Married  Stress: Stress Concern Present   Feeling of Stress : To some extent  Tobacco Use: Medium Risk   Smoking Tobacco Use: Former   Smokeless Tobacco Use: Never  Transportation Needs: No Data processing manager (Medical): No   Lack of Transportation (Non-Medical): No   Patient reports that since taking her Jardiance before breakfast and metformin immediately after eating has helped her stomach significantly. She does not feel unsettled anymore and only had one day of constipation recently. She has also been working on dietary modifications that we discussed at last visit such as increasing her carb intake at breakfast and has been eating more vegetables throughout the day.  Patient is discouraged by her weight and has not been exercising. Recommended setting little goals for exercise such as 15 minutes 3 times a week before trying to exercise every day. Recommended trying to obtain a bike pedal to do exercises while seated at home.  CCM Care Plan  Allergies  Allergen Reactions   Hydrocodone-Guaifenesin Other (See Comments)    Confusion/ memory loss   Adhesive [Tape] Rash and Other (See Comments)    Allergic to defibrillation pads.   Pedi-Pre Tape Spray [Wound Dressing Adhesive] Other (See Comments) and Rash    Allergic to defibrillation pads.    Medications Reviewed Today     Reviewed by Willette Brace, LPN (Licensed Practical Nurse) on 11/30/20 at [phone]  Med List Status: <None>   Medication Order Taking? Sig Documenting Provider Last Dose Status Informant  Ascorbic Acid (VITAMIN  C) 1000 MG tablet [id number] Yes Take 1,000 mg by mouth at bedtime.  [provider] Taking Active Self  atorvastatin (LIPITOR)  40 MG tablet [id number] Yes TAKE 1 TABLET BY MOUTH EVERY DAY Camnitz, Will Hassell Done, MD Taking Active   Calcium Carbonate-Vitamin D (CALCIUM 600+D PO) [id number] Yes Take 1 tablet by mouth daily.  [provider] Taking Active Self  diltiazem (CARDIZEM LA) 360 MG 24 hr tablet [id number] Yes Take 1 tablet (360 mg total) by mouth daily. Constance Haw, MD Taking Active   dofetilide (TIKOSYN) 250 MCG capsule [id number] Yes TAKE ONE CAPSULE BY MOUTH TWICE A DAY Sherran Needs, NP Taking Active   DULoxetine (CYMBALTA) 60 MG capsule [id number] Yes TAKE 1 CAPSULE BY MOUTH EVERY DAY [name], [name], MD Taking Active   ELIQUIS 5 MG TABS tablet [id number] Yes TAKE 1 TABLET BY MOUTH TWICE A DAY  Patient taking differently: Take 5 mg by mouth 2 (two) times daily.   Constance Haw, MD Taking Active   empagliflozin (JARDIANCE) 25 MG TABS tablet [id number] Yes Take 1 tablet (25 mg total) by mouth daily before breakfast. [name], [name], MD Taking Active   fluticasone (FLONASE) 50 MCG/ACT nasal spray [id number] Yes Place 1 spray into both nostrils at bedtime. [provider] Taking Active Self  KLOR-CON M20 20 MEQ tablet [id number] Yes TAKE 1 TABLET (20 MEQ TOTAL) BY MOUTH 2 (TWO) TIMES DAILY. MAY TAKE AN EXTRA TABLET (20 MG) DAILY AS NEEDED FOR LEG CRAMPS. Nahser, Wonda Cheng, MD Taking Active   loratadine (CLARITIN) 10 MG tablet [id number] Yes TAKE 1 TABLET BY MOUTH EVERY DAY [name], [name], MD Taking Active   Lysine 1000 MG TABS [id number] Yes Take 1,000 mg by mouth at bedtime.  [provider] Taking Active Self  metFORMIN (GLUCOPHAGE) 500 MG tablet [id number] Yes TAKE 1 TABLET (500 MG TOTAL) BY MOUTH 2 (TWO) TIMES DAILY WITH A MEAL. INCREASE TO 3 PER DAY AND THEN 4 PER DAY [name], [name], MD Taking Active   metolazone (ZAROXOLYN) 2.5 MG tablet [id number] Yes TAKE 1 TABLET (2.5 MG TOTAL) BY MOUTH EVERY 4 DAYS AS NEEDED FOR SWELLING Camnitz, Will Hassell Done, MD Taking Active   metoprolol  tartrate (LOPRESSOR) 50 MG tablet [id number] Yes TAKE 1 TABLET BY MOUTH TWICE A DAY [name], [name], MD Taking Active   Multiple Vitamin (MULTIVITAMIN WITH MINERALS) TABS tablet [id number] Yes Take 1 tablet by mouth daily. Centrum [provider] Taking Active Self  nitroGLYCERIN (NITROSTAT) 0.4 MG SL tablet [id number] Yes Place 1 tablet (0.4 mg total) under the tongue every 5 (five) minutes x 3 doses as needed for chest pain. Nahser, Wonda Cheng, MD Taking Active   nystatin-triamcinolone [facility] II) cream [id number] Yes Apply 1 application topically 2 (two) times daily as needed.  Patient taking differently: Apply 1 application topically 2 (two) times daily as needed (rash).   [name], [name], MD Taking Active   omeprazole (PRILOSEC) 20 MG capsule [id number] Yes Take 20 mg by mouth daily. [provider] Taking Active   Polyethyl Glycol-Propyl Glycol (SYSTANE) 0.4-0.3 % SOLN [id number] Yes Place 1 drop into both eyes at bedtime. [provider] Taking Active Self  torsemide (DEMADEX) 20 MG tablet [id number] Yes TAKE 2 TABLETS (40 MG TOTAL) BY MOUTH 2 (TWO) TIMES DAILY. Nahser, Wonda Cheng, MD Taking Active   valACYclovir (VALTREX) 1000 MG tablet [id number] Yes Take 2 tablets (2,000 mg total) by mouth every 12 (twelve) hours as needed (fever blisters/cold sores.).  [name], [name], MD Taking Active Self            Patient Active Problem List   Diagnosis Date Noted   Type 2 diabetes mellitus with hyperlipidemia ([place]) 05/22/2020   Acute respiratory failure ([place]) 05/19/2020   Hypokalemia 05/19/2020   Secondary hypercoagulable state ([place]) 02/16/2020   CHF exacerbation ([place]) 05/31/2018   Bronchitis 05/31/2018   Primary osteoarthritis of left knee 04/23/2017   Degenerative arthritis of left knee 04/19/2017   Atypical atrial flutter ([place]) 09/30/2016   Hematoma 09/30/2016   Hypotension 09/30/2016   Persistent atrial fibrillation ([place]) 09/29/2016   Hyperlipidemia    HLD  (hyperlipidemia)    Controlled type 2 diabetes mellitus without complication (HCC)    Acute respiratory failure with hypoxia ([place]) 03/26/2016   DDD (degenerative disc disease), lumbar 04/05/2015   Degenerative disc disease, lumbar 04/05/2015   Severe obesity (BMI >= 40) (HCC) 02/16/2015   Fatty liver disease, nonalcoholic 11/91/4782   Cough, persistent 04/03/2014   Acute on chronic diastolic congestive heart failure ([place]) 10/07/2013   Pre-diabetes 09/24/2013   Peripheral edema 08/05/2013   Neoplasm of uncertain behavior of skin of back 11/09/2012   Edema 11/04/2012   Bleeding mole 11/04/2012   Pulmonary hypertension ([place]) 06/11/2012   Pneumonia 06/05/2012   Hypoxia 06/05/2012   Anemia 06/05/2012   Hemorrhoids 05/11/2012   Dyspepsia 05/11/2012   Medication management 04/20/2011   Positional vertigo 10/07/2010   Anticoagulant long-term use    HERPES LABIALIS 12/30/2009   INTERTRIGO 12/30/2009   Obesity 06/02/2009   Obstructive sleep apnea 10/12/2008   HYPERLIPIDEMIA 09/15/2008   SNORING 09/15/2008   Essential hypertension 06/19/2008   CAD S/P percutaneous coronary angioplasty 06/19/2008   Paroxysmal atrial fibrillation ([place]) 06/19/2008   BRADYCARDIA-TACHYCARDIA SYNDROME 06/19/2008   CEREBRAL ANEURYSM 06/19/2008   Allergic rhinitis 06/19/2008   HYPERGLYCEMIA 06/19/2008   DEPRESSION, HX OF 06/19/2008   CEREBROVASCULAR ACCIDENT, HX OF 06/19/2008   PACEMAKER, PERMANENT 06/19/2008    Immunization History  Administered Date(s) Administered   Fluad Quad(high Dose 65+) 04/10/2019   Influenza Split 04/18/2011, 05/06/2012   Influenza Whole 07/04/2007, 06/02/2009, 03/01/2010   Influenza, High Dose Seasonal PF 04/03/2014, 04/18/2016, 04/05/2017, 05/06/2018   Influenza,inj,Quad PF,6+ Mos 03/07/2013, 04/06/2015   PFIZER(Purple Top)SARS-COV-2 Vaccination 07/22/2019, 08/10/2019, 03/29/2020   Pneumococcal Conjugate-13 09/24/2013, 04/20/2020   Pneumococcal Polysaccharide-23 07/03/2005,  07/04/2007, 04/18/2011   Td 06/02/2009   Zoster, Live 07/03/2006   BP Readings from Last 3 Encounters:  11/30/20 130/64  10/07/20 138/62  10/05/20 130/70     Conditions to be addressed/monitored:  Hypertension, Hyperlipidemia, Diabetes, Atrial Fibrillation, Heart Failure, Coronary Artery Disease, Anxiety and Allergic Rhinitis  Care Plan : CCM Pharmacy Care Plan  Updates made by Viona Gilmore, [place] since 12/21/2020 12:00 AM     Problem: Problem: Hypertension, Hyperlipidemia, Diabetes, Atrial Fibrillation, Heart Failure, Coronary Artery Disease, Anxiety and Allergic Rhinitis      Long-Range Goal: Patient-Specific Goal   Start Date: 11/26/2020  Expected End Date: 11/26/2021  Recent Progress: On track  Priority: High  Note:   Current Barriers:  Unable to maintain control of diabetes  Pharmacist Clinical Goal(s):  Patient will achieve adherence to monitoring guidelines and medication adherence to achieve therapeutic efficacy achieve control of diabetes as evidenced by home blood sugar readings and A1c  through collaboration with PharmD and provider.   Interventions: 1:1 collaboration with [name], [name], MD regarding development and update of comprehensive plan of care as evidenced by provider attestation and co-signature Inter-disciplinary care  team collaboration (see longitudinal plan of care) Comprehensive medication review performed; medication list updated in electronic medical record  Hypertension (BP goal <130/80) -Not ideally controlled -Current treatment: Diltiazem (Cardizem CD) 387m, 1 capsule once daily  Metoprolol tartrate 50 mg 1 tablet twice daily -Medications previously tried: indapamide, losartan -Current home readings: 125/70 -Current dietary habits: did not discuss -Current exercise habits: limited due to shortness of breath -Denies hypotensive/hypertensive symptoms -Educated on Importance of home blood pressure monitoring; Proper BP monitoring  technique; -Counseled to monitor BP at home weekly, document, and provide log at future appointments -Counseled on diet and exercise extensively Recommended to continue current medication  Hyperlipidemia: (LDL goal < 70) -Controlled -Current treatment: Atorvastatin 479m 1 tablet once daily -Medications previously tried: none  -Current dietary patterns: did not discuss -Current exercise habits: limited due to shortness of breath -Educated on Cholesterol goals;  Importance of limiting foods high in cholesterol; -Counseled on diet and exercise extensively Recommended to continue current medication Recommended repeat lipid panel as patient is overdue  CAD (Goal: prevent heart attacks and strokes) -Controlled -Current treatment  Atorvastatin 408m1 tablet daily  Nitroglycerin 0.4mg37m tablet, place 1 tablet under tongue every five minutes for 3 doses as needed for chest pain  -Medications previously tried: none  -Recommended to continue current medication   Diabetes (A1c goal <7%) -Not ideally controlled -Current medications: Metformin 500 mg 2 tablets twice daily Jardiance 25 mg 1 tablet daily -Medications previously tried: FarxIranst)  -Current home glucose readings fasting glucose: 165, 187, 180s post prandial glucose: n/a -Denies hypoglycemic/hyperglycemic symptoms -Current meal patterns:  breakfast: premier protein shake with fruit lunch: did not discuss  dinner: steak and potatoes snacks: did not discuss drinks: did not discuss -Current exercise: limited with shortness of breath -Educated on A1c and blood sugar goals; Benefits of routine self-monitoring of blood sugar; Carbohydrate counting and/or plate method -Counseled to check feet daily and get yearly eye exams -Counseled on diet and exercise extensively Recommended to continue current medication Counseled on importance of taking metformin immediately after eating to help with stomach upset  Atrial  Fibrillation (Goal: prevent stroke and major bleeding) -Controlled -CHADSVASC: 6 -Current treatment: Rate control: Diltiazem (Cardizem CD) 360mg81mcapsule once daily, dofetilide 250 mcg 1 capsule twice daily Anticoagulation: Eliquis 5mg, 47mablet twice daily  -Medications previously tried: dronedarone (patient reported retaining fluid), amiodarone, dofetilide, sotalol  -Home BP and HR readings: did not discuss  -Counseled on importance of adherence to anticoagulant exactly as prescribed; avoidance of NSAIDs due to increased bleeding risk with anticoagulants; -Recommended to continue current medication  Heart Failure (Goal: manage symptoms and prevent exacerbations) -Controlled -Last ejection fraction: 60-65% (Date: 05/20/2020) -HF type: Diastolic -NYHA Class: III (marked limitation of activity) -AHA HF Stage: C (Heart disease and symptoms present) -Current treatment: Torsemide 20mg, 7mblets twice daily  Klor-con 20mEq, 11mblet twice daily Metoprolol tartrate 50 mg 1 tablet twice daily Metolazone 2.5 mg 1 tablet every 4 days as needed -Medications previously tried: n/a  -Current home BP/HR readings: did not discuss -Current dietary habits: limiting salt intake -Current exercise habits: limited due to shortness of breath -Educated on Importance of weighing daily; if you gain more than 3 pounds in one day or 5 pounds in one week, call cardiologist -Recommended to continue current medication  Stress/Anxiety (Goal: minimize symptoms) -Controlled -Current treatment: Duloxetine 60mg,1 c43mle once daily -Medications previously tried/failed: fluoxetine -PHQ9: 0 -GAD7: n/a -Educated on Benefits of medication for symptom control -Recommended to  continue current medication  Allergic rhinitis (Goal: minimize symptoms) -Controlled -Current treatment  Fluticasone (Flonase) 66mg/ act nasal spray, 1 spray into both nostrils daily  Loratadine 157m1 tablet once daily -Medications  previously tried: none  -Recommended to continue current medication  GERD (Goal: minimize symptoms) -Controlled -Current treatment  Omeprazole 2010m1 capsule once daily before breakfast -Medications previously tried: none  -Recommended to continue current medication Patient is taking for stomach pain and reports it is helping  Bone health (Goal prevent fractures) -Controlled -Last DEXA Scan: 04/08/2014  T-Score femoral neck: 0.85  T-Score total hip: n/a  T-Score lumbar spine: n/a  T-Score forearm radius: n/a  10-year probability of major osteoporotic fracture: 5.9%  10-year probability of hip fracture: 0.2% -Patient is not a candidate for pharmacologic treatment -Current treatment  Calcium carbonate/ vitamin D, 1 tablet at bedtime Cholecalciferol (125m69m 5000 units, 1 tablet every evening -Medications previously tried: none  -Recommend [phone] units of vitamin D daily. Recommend 1200 mg of calcium daily from dietary and supplemental sources. Recommend weight-bearing and muscle strengthening exercises for building and maintaining bone density. -Recommended to continue current medication  Health Maintenance -Vaccine gaps: Shingrix -Current therapy:  Ascorbic acid 1000mg51mtablet once daily APAP 500mg,49mn the AM, and 2 in the PM  Diphenhydramine/ APAP, 25-500mg, 66mblets at bedtime  Lysine 1000mg, 164mlet at bedtime  Multivitamin, 1 tablet once daily  Systane, 1 drop into both eyes twice daily as needed Valacyclovir 1000mg, 2 68met every twelve hours as needed for fever blisters/ cold sores  Vitamin B12 1000mcg, 1 44met once daily Vitamin E (670mg) 100017mts, 1 capsule at bedtime  -Educated on Cost vs benefit of each product must be carefully weighed by individual consumer -Patient is satisfied with current therapy and denies issues -Recommended to continue current medication  Patient Goals/Self-Care Activities Patient will:  - take medications as  prescribed check glucose daily, document, and provide at future appointments engage in dietary modifications by following healthy plate model  Follow Up Plan: Telephone follow up appointment with care management team member scheduled for: 3 months      Patient Care Plan: CCM Pharmacy Care Plan     Problem Identified: Problem: Hypertension, Hyperlipidemia, Diabetes, Atrial Fibrillation, Heart Failure, Coronary Artery Disease, Anxiety and Allergic Rhinitis      Long-Range Goal: Patient-Specific Goal   Start Date: 11/26/2020  Expected End Date: 11/26/2021  Recent Progress: On track  Priority: High  Note:   Current Barriers:  Unable to maintain control of diabetes  Pharmacist Clinical Goal(s):  Patient will achieve adherence to monitoring guidelines and medication adherence to achieve therapeutic efficacy achieve control of diabetes as evidenced by home blood sugar readings and A1c  through collaboration with PharmD and provider.   Interventions: 1:1 collaboration with [name], Wanda K, MDStandley Brookinging development and update of comprehensive plan of care as evidenced by provider attestation and co-signature Inter-disciplinary care team collaboration (see longitudinal plan of care) Comprehensive medication review performed; medication list updated in electronic medical record  Hypertension (BP goal <130/80) -Not ideally controlled -Current treatment: Diltiazem (Cardizem CD) 360mg, 1 cap52m once daily  Metoprolol tartrate 50 mg 1 tablet twice daily -Medications previously tried: indapamide, losartan -Current home readings: 125/70 -Current dietary habits: did not discuss -Current exercise habits: limited due to shortness of breath -Denies hypotensive/hypertensive symptoms -Educated on Importance of home blood pressure monitoring; Proper BP monitoring technique; -Counseled to monitor BP at home weekly, document, and provide log at future appointments -  Counseled on diet and exercise  extensively Recommended to continue current medication  Hyperlipidemia: (LDL goal < 70) -Controlled -Current treatment: Atorvastatin 23m, 1 tablet once daily -Medications previously tried: none  -Current dietary patterns: did not discuss -Current exercise habits: limited due to shortness of breath -Educated on Cholesterol goals;  Importance of limiting foods high in cholesterol; -Counseled on diet and exercise extensively Recommended to continue current medication Recommended repeat lipid panel as patient is overdue  CAD (Goal: prevent heart attacks and strokes) -Controlled -Current treatment  Atorvastatin 467m 1 tablet daily  Nitroglycerin 0.79m779mL tablet, place 1 tablet under tongue every five minutes for 3 doses as needed for chest pain  -Medications previously tried: none  -Recommended to continue current medication   Diabetes (A1c goal <7%) -Not ideally controlled -Current medications: Metformin 500 mg 2 tablets twice daily Jardiance 25 mg 1 tablet daily -Medications previously tried: [place])  -Current home glucose readings fasting glucose: 165, 187, 180s post prandial glucose: n/a -Denies hypoglycemic/hyperglycemic symptoms -Current meal patterns:  breakfast: premier protein shake with fruit lunch: did not discuss  dinner: steak and potatoes snacks: did not discuss drinks: did not discuss -Current exercise: limited with shortness of breath -Educated on A1c and blood sugar goals; Benefits of routine self-monitoring of blood sugar; Carbohydrate counting and/or plate method -Counseled to check feet daily and get yearly eye exams -Counseled on diet and exercise extensively Recommended to continue current medication Counseled on importance of taking metformin immediately after eating to help with stomach upset  Atrial Fibrillation (Goal: prevent stroke and major bleeding) -Controlled -CHADSVASC: 6 -Current treatment: Rate control: Diltiazem (Cardizem  CD) 360m52m capsule once daily, dofetilide 250 mcg 1 capsule twice daily Anticoagulation: Eliquis 5mg,40mtablet twice daily  -Medications previously tried: dronedarone (patient reported retaining fluid), amiodarone, dofetilide, sotalol  -Home BP and HR readings: did not discuss  -Counseled on importance of adherence to anticoagulant exactly as prescribed; avoidance of NSAIDs due to increased bleeding risk with anticoagulants; -Recommended to continue current medication  Heart Failure (Goal: manage symptoms and prevent exacerbations) -Controlled -Last ejection fraction: 60-65% (Date: 05/20/2020) -HF type: Diastolic -NYHA Class: III (marked limitation of activity) -AHA HF Stage: C (Heart disease and symptoms present) -Current treatment: Torsemide 20mg,6mablets twice daily  Klor-con 20mEq,479mablet twice daily Metoprolol tartrate 50 mg 1 tablet twice daily Metolazone 2.5 mg 1 tablet every 4 days as needed -Medications previously tried: n/a  -Current home BP/HR readings: did not discuss -Current dietary habits: limiting salt intake -Current exercise habits: limited due to shortness of breath -Educated on Importance of weighing daily; if you gain more than 3 pounds in one day or 5 pounds in one week, call cardiologist -Recommended to continue current medication  Stress/Anxiety (Goal: minimize symptoms) -Controlled -Current treatment: Duloxetine 60mg,1 33mule once daily -Medications previously tried/failed: fluoxetine -PHQ9: 0 -GAD7: n/a -Educated on Benefits of medication for symptom control -Recommended to continue current medication  Allergic rhinitis (Goal: minimize symptoms) -Controlled -Current treatment  Fluticasone (Flonase) 50mcg/ a38masal spray, 1 spray into both nostrils daily  Loratadine 10mg,1 ta80m once daily -Medications previously tried: none  -Recommended to continue current medication  GERD (Goal: minimize symptoms) -Controlled -Current treatment   Omeprazole 20mg, 1 ca18me once daily before breakfast -Medications previously tried: none  -Recommended to continue current medication Patient is taking for stomach pain and reports it is helping  Bone health (Goal prevent fractures) -Controlled -Last DEXA Scan: 04/08/2014  T-Score femoral neck: 0.85  T-Score  total hip: n/a  T-Score lumbar spine: n/a  T-Score forearm radius: n/a  10-year probability of major osteoporotic fracture: 5.9%  10-year probability of hip fracture: 0.2% -Patient is not a candidate for pharmacologic treatment -Current treatment  Calcium carbonate/ vitamin D, 1 tablet at bedtime Cholecalciferol (164mg)  5000 units, 1 tablet every evening -Medications previously tried: none  -Recommend [phone] units of vitamin D daily. Recommend 1200 mg of calcium daily from dietary and supplemental sources. Recommend weight-bearing and muscle strengthening exercises for building and maintaining bone density. -Recommended to continue current medication  Health Maintenance -Vaccine gaps: Shingrix -Current therapy:  Ascorbic acid 10075m 1 tablet once daily APAP 50010m2 in the AM, and 2 in the PM  Diphenhydramine/ APAP, 25-500m72m tablets at bedtime  Lysine 1000mg68mtablet at bedtime  Multivitamin, 1 tablet once daily  Systane, 1 drop into both eyes twice daily as needed Valacyclovir 1000mg,50mablet every twelve hours as needed for fever blisters/ cold sores  Vitamin B12 1000mcg,80mablet once daily Vitamin E (670mg) 171munits, 1 capsule at bedtime  -Educated on Cost vs benefit of each product must be carefully weighed by individual consumer -Patient is satisfied with current therapy and denies issues -Recommended to continue current medication  Patient Goals/Self-Care Activities Patient will:  - take medications as prescribed check glucose daily, document, and provide at future appointments engage in dietary modifications by following healthy plate  model  Follow Up Plan: Telephone follow up appointment with care management team member scheduled for: 3 months      Medication Assistance: None required.  Patient affirms current coverage meets needs.  Compliance/Adherence/Medication fill history: Care Gaps: None  Star-Rating Drugs: Jardiance 25 mg - last filled 11/16/20 for 30 ds Metformin 500 mg - last filled 10/16/20 for 90 ds Atorvastatin 40 mg - last filled 09/10/20 for 90 ds  Patient's preferred pharmacy is:  [id number] [id number] [place] [address] [phone] [place] [facility] [address] [phone] [id number] [phone] box? Yes - patient uses weekly pill box for 2 weeks Pt endorses 100% compliance  We discussed: Current pharmacy is preferred with insurance plan and patient is satisfied with pharmacy services Patient decided to: Continue current medication management strategy  Care Plan and Follow Up Patient Decision:  Patient agrees to Care Plan and Follow-up.  Plan: Telephone follow up appointment with care management team member scheduled for:  3 months  Polina Burmaster Jeni SallesBCACP [place]st [place] HBig Rapidsfiel[place]rystal [phone]

## 2020-12-21 ENCOUNTER — Ambulatory Visit (INDEPENDENT_AMBULATORY_CARE_PROVIDER_SITE_OTHER): Payer: PPO | Admitting: Pharmacist

## 2020-12-21 DIAGNOSIS — I10 Essential (primary) hypertension: Secondary | ICD-10-CM

## 2020-12-21 DIAGNOSIS — E1165 Type 2 diabetes mellitus with hyperglycemia: Secondary | ICD-10-CM | POA: Diagnosis not present

## 2020-12-21 NOTE — Patient Instructions (Signed)
Hi [name],  It was great to speak with you again! As we discussed, try to challenge yourself to 15 minutes of exercise a few days a  week and work up from there. It's all about making small changes you can keep up with! You may want to look into that foot pedal I was talking about as it tends to be easier on joints and easier to start with.  Please reach out to me if you have any questions or need anything before our follow up!  Best, Maddie  [name], PharmD, [facility]   Visit Information   Goals Addressed   None    Patient Care Plan: [facility] Pharmacy Care Plan     Problem Identified: Problem: Hypertension, Hyperlipidemia, Diabetes, Atrial Fibrillation, Heart Failure, Coronary Artery Disease, Anxiety and Allergic Rhinitis      Long-Range Goal: Patient-Specific Goal   Start Date: 11/26/2020  Expected End Date: 11/26/2021  Recent Progress: On track  Priority: High  Note:   Current Barriers:  Unable to maintain control of diabetes  Pharmacist Clinical Goal(s):  Patient will achieve adherence to monitoring guidelines and medication adherence to achieve therapeutic efficacy achieve control of diabetes as evidenced by home blood sugar readings and A1c  through collaboration with PharmD and provider.   Interventions: 1:1 collaboration with [name], [name], MD regarding development and update of comprehensive plan of care as evidenced by provider attestation and co-signature Inter-disciplinary care team collaboration (see longitudinal plan of care) Comprehensive medication review performed; medication list updated in electronic medical record  Hypertension (BP goal <130/80) -Not ideally controlled -Current treatment: Diltiazem (Cardizem CD) 360mg , 1 capsule once daily  Metoprolol tartrate 50 mg 1 tablet twice daily -Medications previously tried: indapamide, losartan -Current home readings: 125/70 -Current dietary  habits: did not discuss -Current exercise habits: limited due to shortness of breath -Denies hypotensive/hypertensive symptoms -Educated on Importance of home blood pressure monitoring; Proper BP monitoring technique; -Counseled to monitor BP at home weekly, document, and provide log at future appointments -Counseled on diet and exercise extensively Recommended to continue current medication  Hyperlipidemia: (LDL goal < 70) -Controlled -Current treatment: Atorvastatin 40mg , 1 tablet once daily -Medications previously tried: none  -Current dietary patterns: did not discuss -Current exercise habits: limited due to shortness of breath -Educated on Cholesterol goals;  Importance of limiting foods high in cholesterol; -Counseled on diet and exercise extensively Recommended to continue current medication Recommended repeat lipid panel as patient is overdue  CAD (Goal: prevent heart attacks and strokes) -Controlled -Current treatment  Atorvastatin 40mg , 1 tablet daily  Nitroglycerin 0.4mg  SL tablet, place 1 tablet under tongue every five minutes for 3 doses as needed for chest pain  -Medications previously tried: none  -Recommended to continue current medication   Diabetes (A1c goal <7%) -Not ideally controlled -Current medications: Metformin 500 mg 2 tablets twice daily Jardiance 25 mg 1 tablet daily -Medications previously tried: [place] (cost)  -Current home glucose readings fasting glucose: 165, 187, 180s post prandial glucose: n/a -Denies hypoglycemic/hyperglycemic symptoms -Current meal patterns:  breakfast: premier protein shake with fruit lunch: did not discuss  dinner: steak and potatoes snacks: did not discuss drinks: did not discuss -Current exercise: limited with shortness of breath -Educated on A1c and blood sugar goals; Benefits of routine self-monitoring of blood sugar; Carbohydrate counting and/or plate method -Counseled to check feet daily and get yearly  eye exams -Counseled on diet and exercise extensively Recommended to continue current medication Counseled  on importance of taking metformin immediately after eating to help with stomach upset  Atrial Fibrillation (Goal: prevent stroke and major bleeding) -Controlled -CHADSVASC: 6 -Current treatment: Rate control: Diltiazem (Cardizem CD) 360mg , 1 capsule once daily, dofetilide 250 mcg 1 capsule twice daily Anticoagulation: Eliquis 5mg , 1 tablet twice daily  -Medications previously tried: dronedarone (patient reported retaining fluid), amiodarone, dofetilide, sotalol  -Home BP and HR readings: did not discuss  -Counseled on importance of adherence to anticoagulant exactly as prescribed; avoidance of NSAIDs due to increased bleeding risk with anticoagulants; -Recommended to continue current medication  Heart Failure (Goal: manage symptoms and prevent exacerbations) -Controlled -Last ejection fraction: 60-65% (Date: 05/20/2020) -HF type: Diastolic -NYHA Class: III (marked limitation of activity) -AHA HF Stage: C (Heart disease and symptoms present) -Current treatment: Torsemide 20mg , 2 tablets twice daily  Klor-con 8mEq, 1 tablet twice daily Metoprolol tartrate 50 mg 1 tablet twice daily Metolazone 2.5 mg 1 tablet every 4 days as needed -Medications previously tried: n/a  -Current home BP/HR readings: did not discuss -Current dietary habits: limiting salt intake -Current exercise habits: limited due to shortness of breath -Educated on Importance of weighing daily; if you gain more than 3 pounds in one day or 5 pounds in one week, call cardiologist -Recommended to continue current medication  Stress/Anxiety (Goal: minimize symptoms) -Controlled -Current treatment: Duloxetine 60mg ,1 capsule once daily -Medications previously tried/failed: fluoxetine -PHQ9: 0 -GAD7: n/a -Educated on Benefits of medication for symptom control -Recommended to continue current  medication  Allergic rhinitis (Goal: minimize symptoms) -Controlled -Current treatment  Fluticasone (Flonase) 18mcg/ act nasal spray, 1 spray into both nostrils daily  Loratadine 10mg ,1 tablet once daily -Medications previously tried: none  -Recommended to continue current medication  GERD (Goal: minimize symptoms) -Controlled -Current treatment  Omeprazole 20mg , 1 capsule once daily before breakfast -Medications previously tried: none  -Recommended to continue current medication Patient is taking for stomach pain and reports it is helping  Bone health (Goal prevent fractures) -Controlled -Last DEXA Scan: 04/08/2014  T-Score femoral neck: 0.85  T-Score total hip: n/a  T-Score lumbar spine: n/a  T-Score forearm radius: n/a  10-year probability of major osteoporotic fracture: 5.9%  10-year probability of hip fracture: 0.2% -Patient is not a candidate for pharmacologic treatment -Current treatment  Calcium carbonate/ vitamin D, 1 tablet at bedtime Cholecalciferol (122mcg)  5000 units, 1 tablet every evening -Medications previously tried: none  -Recommend [phone] units of vitamin D daily. Recommend 1200 mg of calcium daily from dietary and supplemental sources. Recommend weight-bearing and muscle strengthening exercises for building and maintaining bone density. -Recommended to continue current medication  Health Maintenance -Vaccine gaps: Shingrix -Current therapy:  Ascorbic acid 1000mg , 1 tablet once daily APAP 500mg , 2 in the AM, and 2 in the PM  Diphenhydramine/ APAP, 25-500mg , 2 tablets at bedtime  Lysine 1000mg , 1 tablet at bedtime  Multivitamin, 1 tablet once daily  Systane, 1 drop into both eyes twice daily as needed Valacyclovir 1000mg , 2 tablet every twelve hours as needed for fever blisters/ cold sores  Vitamin B12 1040mcg, 1 tablet once daily Vitamin E (670mg ) 1000 units, 1 capsule at bedtime  -Educated on Cost vs benefit of each product must be carefully  weighed by individual consumer -Patient is satisfied with current therapy and denies issues -Recommended to continue current medication  Patient Goals/Self-Care Activities Patient will:  - take medications as prescribed check glucose daily, document, and provide at future appointments engage in dietary modifications by following healthy plate model  Follow  Up Plan: Telephone follow up appointment with care management team member scheduled for: 3 months       Patient verbalizes understanding of instructions provided today and agrees to view in [place].  Telephone follow up appointment with pharmacy team member scheduled for:3 months  Viona Gilmore, [facility]

## 2021-01-06 ENCOUNTER — Encounter: Payer: Self-pay | Admitting: Internal Medicine

## 2021-01-07 ENCOUNTER — Other Ambulatory Visit: Payer: Self-pay | Admitting: Internal Medicine

## 2021-01-11 ENCOUNTER — Other Ambulatory Visit: Payer: Self-pay | Admitting: Cardiology

## 2021-01-18 ENCOUNTER — Encounter: Payer: Self-pay | Admitting: Cardiology

## 2021-01-18 ENCOUNTER — Ambulatory Visit: Payer: PPO | Admitting: Cardiology

## 2021-01-18 ENCOUNTER — Other Ambulatory Visit: Payer: Self-pay

## 2021-01-18 ENCOUNTER — Other Ambulatory Visit: Payer: Self-pay | Admitting: Internal Medicine

## 2021-01-18 VITALS — BP 130/68 | HR 68 | Ht 66.5 in | Wt 255.0 lb

## 2021-01-18 DIAGNOSIS — I4819 Other persistent atrial fibrillation: Secondary | ICD-10-CM

## 2021-01-18 NOTE — Patient Instructions (Signed)
Medication Instructions:  °Your physician recommends that you continue on your current medications as directed. Please refer to the Current Medication list given to you today. ° °*If you need a refill on your cardiac medications before your next appointment, please call your pharmacy* ° ° °Lab Work: °None ordered ° ° °Testing/Procedures: °None ordered ° ° °Follow-Up: °At [facility], you and your health needs are our priority.  As part of our continuing mission to provide you with exceptional heart care, we have created designated Provider Care Teams.  These Care Teams include your primary Cardiologist (physician) and Advanced Practice Providers (APPs -  Physician Assistants and Nurse Practitioners) who all work together to provide you with the care you need, when you need it. ° °Your next appointment:   °6 month(s) ° °The format for your next appointment:   °In Person ° °Provider:   °Will Camnitz, MD ° ° ° °Thank you for choosing [facility]!! ° ° °Makara Lanzo, RN °[phone] °  °

## 2021-01-18 NOTE — Progress Notes (Signed)
Electrophysiology Office Note   Date:  01/18/2021   ID:  [name], DOB 12-03-1945, MRN [id number]  PCP:  [name], MD  Primary Electrophysiologist: Gaye Alken, MD    No chief complaint on file.    History of Present Illness: [name] is a 75 y.o. female who presents today for electrophysiology evaluation.     She has a history significant for coronary artery disease status post RCA stent, diastolic heart failure, atrial fibrillation, tachybradycardia syndrome status post [facility] Jude dual-chamber pacemaker, CKD, hypertension, hyperlipidemia, OSA on CPAP.  She is status post AF ablation x3.  She has been loaded on dofetilide.  She came back with atrial flutter and is now status post mitral annular flutter ablation.  Today, denies symptoms of palpitations, chest pain, shortness of breath, orthopnea, PND, lower extremity edema, claudication, dizziness, presyncope, syncope, bleeding, or neurologic sequela. The patient is tolerating medications without difficulties.  Since being seen she has done well.  She has had minimal atrial arrhythmias since her most recent lesion.  She is overall happy with her control.  She has been losing some weight slowly.  She feels well.  Past Medical History:  Diagnosis Date   Anticoagulant long-term use    pradaxa   Anxiety    Arthritis    "fingers, lower back" (04/23/2017    CAD (coronary artery disease) 0737,1062   post PTCA with bare-metal stenting to mid RCA in December 2004      CHF (congestive heart failure) (HCC)    Chronic atrial fibrillation ([place]) 06/2007   Tachybradycardia pacemaker   Chronic kidney disease    10% function - ?R, other kidney is compensating     CVA (cerebral vascular accident) [facility]) [phone]   denies residual on 04/23/2017   Depression    Diplopia 06/19/2008   Qualifier: Diagnosis of  By: [name] MD, [name]    Dysrhythmia    ATRIAL FIBRILATION   Edema of lower extremity    Hyperlipidemia     Hypertension    Inferior myocardial infarction [facility])    acute inferior wall mi/other medical hx   Myocardial infarction [facility]) 7035,0093   Obesity    OSA on CPAP    last test- 2010   Pacemaker    Pneumonia 2014   tx. ----  [facility]   Pulmonary hypertension ([place])    moderate pulmonary hypertension by 10/2016 echo and 10/2013 cardiac cath   Shortness of breath    Skin cancer    "cut off right Bartha; burned off LLE" (04/23/2017)   Sleep apnea    Spondylolisthesis    TIA (transient ischemic attack) 2008   Unspecified hemorrhoids without mention of complication 02/18/2992   Colonoscopy--Dr. Carlean Purl    Past Surgical History:  Procedure Laterality Date   ABDOMINAL HYSTERECTOMY     APPENDECTOMY  1984   ATRIAL FIBRILLATION ABLATION N/A 09/29/2016   Procedure: Atrial Fibrillation Ablation;  Surgeon: Krupa Stege Meredith Leeds, MD;  Location: [place] CV LAB;  Service: Cardiovascular;  Laterality: N/A;   ATRIAL FIBRILLATION ABLATION N/A 02/07/2018   Procedure: ATRIAL FIBRILLATION ABLATION;  Surgeon: Constance Haw, MD;  Location: [place] CV LAB;  Service: Cardiovascular;  Laterality: N/A;   ATRIAL FIBRILLATION ABLATION N/A 03/13/2019   Procedure: ATRIAL FIBRILLATION ABLATION;  Surgeon: Constance Haw, MD;  Location: [place] CV LAB;  Service: Cardiovascular;  Laterality: N/A;   ATRIAL FIBRILLATION ABLATION N/A 07/07/2020   Procedure: ATRIAL FIBRILLATION ABLATION;  Surgeon: Curt Bears,  Ocie Doyne, MD;  Location: [place] CV LAB;  Service: Cardiovascular;  Laterality: N/A;   BACK SURGERY     CARDIOVERSION N/A 09/12/2017   Procedure: CARDIOVERSION;  Surgeon: Jerline Pain, MD;  Location: [facility] ENDOSCOPY;  Service: Cardiovascular;  Laterality: N/A;   CARDIOVERSION N/A 12/13/2017   Procedure: CARDIOVERSION;  Surgeon: Sanda Klein, MD;  Location: [place] ENDOSCOPY;  Service: Cardiovascular;  Laterality: N/A;   CARDIOVERSION N/A 02/18/2018   Procedure: CARDIOVERSION;  Surgeon: Dorothy Spark, MD;  Location: [facility] ENDOSCOPY;  Service: Cardiovascular;  Laterality: N/A;   CARDIOVERSION N/A 05/20/2018   Procedure: CARDIOVERSION;  Surgeon: [name], MD;  Location: [facility] ENDOSCOPY;  Service: Cardiovascular;  Laterality: N/A;   CARDIOVERSION N/A 04/16/2019   Procedure: CARDIOVERSION;  Surgeon: Donato Heinz, MD;  Location: [place];  Service: Endoscopy;  Laterality: N/A;   CARDIOVERSION N/A 01/22/2020   Procedure: CARDIOVERSION;  Surgeon: Geralynn Rile, MD;  Location: [place];  Service: Cardiovascular;  Laterality: N/A;   CARDIOVERSION N/A 02/18/2020   Procedure: CARDIOVERSION;  Surgeon: Elouise Munroe, MD;  Location: [facility] ENDOSCOPY;  Service: Cardiovascular;  Laterality: N/A;   CATARACT EXTRACTION W/ INTRAOCULAR LENS  IMPLANT, BILATERAL Bilateral 01/15/2017- 03/2017   CHOLECYSTECTOMY     CORONARY ANGIOPLASTY  X 2   CORONARY ANGIOPLASTY WITH [place]; ~ 2007; ?date   "1 stent; replaced stent; not sure when I got the last stent" (04/23/2017)   DOPPLER ECHOCARDIOGRAPHY  2009   ELECTROPHYSIOLOGIC STUDY N/A 03/31/2016   Procedure: Cardioversion;  Surgeon: Evans Lance, MD;  Location: [place] CV LAB;  Service: Cardiovascular;  Laterality: N/A;   ELECTROPHYSIOLOGIC STUDY N/A 08/04/2016   Procedure: Cardioversion;  Surgeon: Evans Lance, MD;  Location: [place] CV LAB;  Service: Cardiovascular;  Laterality: N/A;   INSERT / REPLACE / REMOVE PACEMAKER  06/2007   IR RADIOLOGY PERIPHERAL GUIDED IV START  01/31/2018   IR US GUIDE VASC ACCESS LEFT  01/31/2018   JOINT REPLACEMENT     LAPAROSCOPIC CHOLECYSTECTOMY  1994   LEFT AND RIGHT HEART CATHETERIZATION WITH CORONARY ANGIOGRAM N/A 10/06/2013   Procedure: LEFT AND RIGHT HEART CATHETERIZATION WITH CORONARY ANGIOGRAM;  Surgeon: Troy Sine, MD;  Location: [facility] CATH LAB;  Service: Cardiovascular;  Laterality: N/A;   LEFT OOPHORECTOMY Left ~ [place]  2000s - 04/2015 X 3    L3-4; L4-5; L2-3; Dr Trenton Gammon   RIGHT/LEFT HEART CATH AND CORONARY ANGIOGRAPHY N/A 05/09/2017   Procedure: RIGHT/LEFT HEART CATH AND CORONARY ANGIOGRAPHY;  Surgeon: Sherren Mocha, MD;  Location: [place] CV LAB;  Service: Cardiovascular;  Laterality: N/A;   SKIN CANCER EXCISION Right    [place]   TEE WITHOUT CARDIOVERSION N/A 09/29/2016   Procedure: TRANSESOPHAGEAL ECHOCARDIOGRAM (TEE);  Surgeon: Jerline Pain, MD;  Location: [facility] ENDOSCOPY;  Service: Cardiovascular;  Laterality: N/A;   TOTAL ABDOMINAL HYSTERECTOMY  1984   "uterus & right ovary"   TOTAL KNEE ARTHROPLASTY Left 04/23/2017   TOTAL KNEE ARTHROPLASTY Left 04/23/2017   Procedure: TOTAL KNEE ARTHROPLASTY;  Surgeon: Frederik Pear, MD;  Location: [place];  Service: Orthopedics;  Laterality: Left;   ULTRASOUND GUIDANCE FOR VASCULAR ACCESS  05/09/2017   Procedure: Ultrasound Guidance For Vascular Access;  Surgeon: Sherren Mocha, MD;  Location: [place] CV LAB;  Service: Cardiovascular;;     Current Outpatient Medications  Medication Sig Dispense Refill   Ascorbic Acid (VITAMIN C) 1000 MG tablet Take 1,000 mg by mouth at bedtime.  atorvastatin (LIPITOR) 40 MG tablet TAKE 1 TABLET BY MOUTH EVERY DAY 90 tablet 3   Calcium Carbonate-Vitamin D (CALCIUM 600+D PO) Take 1 tablet by mouth daily.      diltiazem (CARDIZEM LA) 360 MG 24 hr tablet Take 1 tablet (360 mg total) by mouth daily. 90 tablet 3   dofetilide (TIKOSYN) 250 MCG capsule TAKE ONE CAPSULE BY MOUTH TWICE A DAY 180 capsule 2   DULoxetine (CYMBALTA) 60 MG capsule TAKE 1 CAPSULE BY MOUTH EVERY DAY 90 capsule 1   ELIQUIS 5 MG TABS tablet TAKE 1 TABLET BY MOUTH TWICE A DAY 180 tablet 2   fluticasone (FLONASE) 50 MCG/ACT nasal spray Place 1 spray into both nostrils at bedtime.     JARDIANCE 25 MG TABS tablet TAKE 1 TABLET BY MOUTH DAILY BEFORE BREAKFAST. 30 tablet 3   loratadine (CLARITIN) 10 MG tablet TAKE 1 TABLET BY MOUTH EVERY DAY 90 tablet 1   Lysine 1000 MG TABS Take 1,000  mg by mouth at bedtime.      metFORMIN (GLUCOPHAGE) 500 MG tablet TAKE 1 TABLET (500 MG TOTAL) BY MOUTH 2 (TWO) TIMES DAILY WITH A MEAL. INCREASE TO 3 PER DAY AND THEN 4 PER DAY 360 tablet 1   metolazone (ZAROXOLYN) 2.5 MG tablet Take 1 tablet (2.5 mg total) by mouth every 4 days AS NEEDED for swelling. Please keep upcoming appointment in July 2022 for future refills. Thank you 21 tablet 0   metoprolol tartrate (LOPRESSOR) 50 MG tablet TAKE 1 TABLET BY MOUTH TWICE A DAY 60 tablet 3   Multiple Vitamin (MULTIVITAMIN WITH MINERALS) TABS tablet Take 1 tablet by mouth daily. Centrum     nitroGLYCERIN (NITROSTAT) 0.4 MG SL tablet Place 1 tablet (0.4 mg total) under the tongue every 5 (five) minutes x 3 doses as needed for chest pain. PLEASE CALL [phone] AND SCHEDULE AN OVERDUE APPT WITH DR Acie Fredrickson FOR FURTHER REFILLS TO BE GRANTED (2ND ATTEMPT) 25 tablet 0   nystatin-triamcinolone (MYCOLOG II) cream Apply 1 application topically 2 (two) times daily as needed. 30 g 1   omeprazole (PRILOSEC) 20 MG capsule Take 20 mg by mouth daily.     Polyethyl Glycol-Propyl Glycol (SYSTANE) 0.4-0.3 % SOLN Place 1 drop into both eyes at bedtime.     Potassium Chloride Crys ER (KLOR-CON M20 PO) Take 20 mg by mouth in the morning and at bedtime. Per patient taking 2 tablets daily     torsemide (DEMADEX) 20 MG tablet TAKE 2 TABLETS (40 MG TOTAL) BY MOUTH 2 (TWO) TIMES DAILY. 360 tablet 3   valACYclovir (VALTREX) 1000 MG tablet TAKE 2 TABS EVERY 12 HOURS ASD NEEDED 30 tablet 1   No current facility-administered medications for this visit.    Allergies:   Hydrocodone-guaifenesin, Adhesive [tape], and Pedi-pre tape spray [wound dressing adhesive]   Social History:  The patient  reports that she quit smoking about 38 years ago. Her smoking use included cigarettes. She started smoking about 42 years ago. She has a 5.00 pack-year smoking history. She has never used smokeless tobacco. She reports current alcohol use. She  reports that she does not use drugs.   Family History:  The patient's family history includes Arrhythmia in her mother; Colon cancer in her maternal aunt; Dementia in her mother; Diabetes in her mother and paternal grandfather; Heart attack in her brother; Heart disease in her paternal aunt; Hypertension in her mother; Prostate cancer in her maternal grandfather; Suicidality in her father.   ROS:  Please  see the history of present illness.   Otherwise, review of systems is positive for none.   All other systems are reviewed and negative.   PHYSICAL EXAM: VS:  BP 130/68   Pulse 68   Ht 5' 6.5" (1.689 m)   Wt 255 lb (115.7 kg)   SpO2 90%   BMI 40.54 kg/m  , BMI Body mass index is 40.54 kg/m. GEN: Well nourished, well developed, in no acute distress  HEENT: normal  Neck: no JVD, carotid bruits, or masses Cardiac: RRR; no murmurs, rubs, or gallops,no edema  Respiratory:  clear to auscultation bilaterally, normal work of breathing GI: soft, nontender, nondistended, + BS MS: no deformity or atrophy  Skin: warm and dry, device site well healed Neuro:  Strength and sensation are intact Psych: euthymic mood, full affect  EKG:  EKG is ordered today. Personal review of the ekg ordered shows sinus rhythm, rate 68  Personal review of the device interrogation today. Results in [place]: 01/28/2020: ALT 28; TSH 2.77 05/19/2020: B Natriuretic Peptide 51.8 06/26/2020: Hemoglobin 14.5; Platelets 197 08/04/2020: Magnesium 2.2 08/13/2020: BUN 28; Creatinine, Ser 1.25; Potassium 4.0; Sodium 140    Lipid Panel     Component Value Date/Time   CHOL 151 04/10/2019 0801   CHOL 116 10/16/2016 0850   TRIG 160.0 (H) 04/10/2019 0801   HDL 50.80 04/10/2019 0801   HDL 42 10/16/2016 0850   CHOLHDL 3 04/10/2019 0801   VLDL 32.0 04/10/2019 0801   LDLCALC 68 04/10/2019 0801   LDLCALC 46 10/16/2016 0850     Wt Readings from Last 3 Encounters:  01/18/21 255 lb (115.7 kg)  11/30/20 264 lb  9.6 oz (120 kg)  10/07/20 260 lb 6.4 oz (118.1 kg)      Other studies Reviewed: Additional studies/ records that were reviewed today include: TTE 03/28/16  Review of the above records today demonstrates:  - Left ventricle: The cavity size was normal. Wall thickness was   increased in a pattern of mild LVH. Systolic function was normal.   The estimated ejection fraction was in the range of 55% to 60%.   Indeterminant diastolic function (atrial fibrillation). Wall   motion was normal; there were no regional wall motion   abnormalities. - Aortic valve: There was no stenosis. - Mitral valve: Mildly calcified annulus. There was no significant   regurgitation. - Left atrium: The atrium was mildly dilated. - Right ventricle: The cavity size was normal. Pacer wire or   catheter noted in right ventricle. Systolic function was normal. - Tricuspid valve: Peak RV-RA gradient (S): 22 mm Hg. - Pulmonary arteries: PA peak pressure: 30 mm Hg (S). - Systemic veins: IVC measured 2.3 cm with normal respirophasic   variation, suggesting RA pressure 8 mmHg.  LHC/RHC 05/09/17 1.  Single-vessel coronary artery disease with continued patency of the stented segment in the right coronary artery and chronic occlusion of a small acute marginal branch collateralized by the left coronary artery 2.  Patent left main, LAD, and left circumflex without significant stenosis 3.  Vigorous LV systolic function with mildly elevated LVEDP 4.  Severe pulmonary hypertension with mean pulmonary artery pressure of 48 mmHg.  Because of high cardiac output (8.5 L/min), the patient's pulmonary vascular resistance is only mildly increased at 3 [place] units.  ASSESSMENT AND PLAN:  1.  Persistent atrial fibrillation/flutter: Status post ablation x3.  Currently on Eliquis with a CHA2DS2-VASc of 3.  Has been started on dofetilide.  High risk  medication monitoring.  She is remained in sinus rhythm.  2.  Hypertension: Currently well  controlled  3.  Obesity: Diet and exercise encouraged  4.  Chronic diastolic heart failure: No obvious volume overload.  5.  Obstructive sleep apnea: CPAP compliance encouraged  6.  Sinus node dysfunction: Status post [facility] Jude dual-chamber pacemaker.  Device functioning appropriately.  No changes at this time.  Current medicines are reviewed at length with the patient today.   The patient does not have concerns regarding her medicines.  The following changes were made today: None  Labs/ tests ordered today include:  Orders Placed This Encounter  Procedures   EKG 12-Lead      Disposition:   FU with Aneli Zara 6 months  Signed, Ivalee Strauser Meredith Leeds, MD  01/18/2021 3:52 PM     [place] [address] [place] [address] [phone] (office) [phone] (fax)

## 2021-01-21 ENCOUNTER — Other Ambulatory Visit: Payer: Self-pay | Admitting: Internal Medicine

## 2021-02-02 ENCOUNTER — Other Ambulatory Visit: Payer: Self-pay | Admitting: Cardiovascular Disease

## 2021-02-02 ENCOUNTER — Other Ambulatory Visit: Payer: Self-pay | Admitting: Cardiology

## 2021-02-02 ENCOUNTER — Other Ambulatory Visit: Payer: Self-pay | Admitting: Internal Medicine

## 2021-02-02 ENCOUNTER — Telehealth: Payer: Self-pay | Admitting: *Deleted

## 2021-02-02 MED ORDER — POTASSIUM CHLORIDE CRYS ER 20 MEQ PO TBCR: 20.0000 meq | EXTENDED_RELEASE_TABLET | Freq: Two times a day (BID) | ORAL | 6 refills | Status: DC

## 2021-02-02 NOTE — Telephone Encounter (Signed)
[facility] [address] [place] [phone] is requesting medication refill for Potassium Chloride Crys ER (Klor-Con M20 PO) medication is on patient's med list but no prescriber is listed to refill. Please advise. Thank you

## 2021-02-02 NOTE — Telephone Encounter (Addendum)
Prescription refill request for Eliquis received. Indication: Afib Last office visit:01/15/21 ([place]) Scr: 1.25 (08/13/20) Age: 75 Weight: 115.7kg  Appropriate dose and refill sent to requested pharmacy

## 2021-02-08 ENCOUNTER — Ambulatory Visit: Payer: PPO | Admitting: Internal Medicine

## 2021-02-08 ENCOUNTER — Other Ambulatory Visit: Payer: Self-pay | Admitting: Internal Medicine

## 2021-02-08 MED ORDER — POTASSIUM CHLORIDE CRYS ER 20 MEQ PO TBCR: 40.0000 meq | EXTENDED_RELEASE_TABLET | Freq: Two times a day (BID) | ORAL | 6 refills | Status: DC

## 2021-02-08 NOTE — Telephone Encounter (Signed)
Pt c/o medication issue:  1. Name of Medication:  potassium chloride SA (KLOR-CON M20) 20 MEQ tablet  2. How are you currently taking this medication (dosage and times per day)?  1 tablet by mouth 4X daily 3. Are you having a reaction (difficulty breathing--STAT)?  No   4. What is your medication issue?   Patient is following up regarding her recent medication refill, requesting to speak with Venida Jarvis, RN specifically. She states she has been taking 4 tablets daily, but the new prescription is for 2 tablets daily. She would like to know if it's absolutely necessary for her to take as prescribed by Dr. Acie Fredrickson. She states if she doesn't take as directed or have the prescription adjusted she will run out of medication early. Please advise.

## 2021-02-08 NOTE — Telephone Encounter (Signed)
Spoke with the patient who states that she has been taking potassium 2 tablets (40 meq) twice daily. She has been doing this for about 6 months.  Her prescription is only for 1 tablet (20 meq) twice daily.  She would like to clarify what dose she should be taking. It looks like dose was increased by [facility] in 08/2020.

## 2021-02-08 NOTE — Telephone Encounter (Signed)
Spoke with pts husband. Pt is on 28meq bid. New RX was sent to pharmacy.

## 2021-02-08 NOTE — Addendum Note (Signed)
Addended by: Juluis Mire on: 02/08/2021 02:12 PM   Modules accepted: Orders

## 2021-02-15 ENCOUNTER — Ambulatory Visit: Payer: PPO | Admitting: Internal Medicine

## 2021-03-18 ENCOUNTER — Telehealth: Payer: PPO

## 2021-03-23 ENCOUNTER — Telehealth: Payer: Self-pay | Admitting: Pharmacist

## 2021-03-23 NOTE — Chronic Care Management (AMB) (Signed)
Chronic Care Management Pharmacy Assistant   Name: [name]  MRN: [id number] DOB: 1945-12-28  03-23-2021 APPOINTMENT REMINDER   Called [name], No answer, left message of appointment on 03-24-2021 at 11 via telephone visit with [name], Pharm D. Notified to have all medications, supplements, blood pressure and/or blood sugar logs available during appointment and to return call if need to reschedule.  Care Gaps: Zoster Vaccines - Overdue Colonoscopy - Overdue Eye Exam - Overdue Urine Micro - Overdue  TDAP - Overdue COVID Booster #4 Therapist, music) - Overdue Foot Exam - Overdue Flu Vaccine - Overdue  AWV - Done 11-30-20  Star Rating Drugs: Metformin (Glucophage) 500 mg - [facility] Empagliflozin (Jardiance) 25 mg - Last filled [facility]-12-2021 [facility] Atorvastatin (Lipitor) 40 mg - Last [facility]-11-2021 [facility]  Any gaps in medications fill history? None   Medications: Outpatient Encounter Medications as of 03/23/2021  Medication Sig   Ascorbic Acid (VITAMIN C) 1000 MG tablet Take 1,000 mg by mouth at bedtime.    atorvastatin (LIPITOR) 40 MG tablet TAKE 1 TABLET BY MOUTH EVERY DAY   Calcium Carbonate-Vitamin D (CALCIUM 600+D PO) Take 1 tablet by mouth daily.    diltiazem (CARDIZEM LA) 360 MG 24 hr tablet Take 1 tablet (360 mg total) by mouth daily.   dofetilide (TIKOSYN) 250 MCG capsule TAKE ONE CAPSULE BY MOUTH TWICE A DAY   DULoxetine (CYMBALTA) 60 MG capsule TAKE 1 CAPSULE BY MOUTH EVERY DAY   ELIQUIS 5 MG TABS tablet TAKE 1 TABLET BY MOUTH TWICE A DAY   fluticasone (FLONASE) 50 MCG/ACT nasal spray Place 1 spray into both nostrils at bedtime.   JARDIANCE 25 MG TABS tablet TAKE 1 TABLET BY MOUTH DAILY BEFORE BREAKFAST.   loratadine (CLARITIN) 10 MG tablet TAKE 1 TABLET BY MOUTH EVERY DAY   Lysine 1000 MG TABS Take 1,000 mg by mouth at bedtime.    metFORMIN (GLUCOPHAGE) 500 MG tablet TAKE 1 TABLET (500 MG TOTAL) BY MOUTH 2 (TWO) TIMES DAILY  WITH A MEAL. INCREASE TO 3 PER DAY AND THEN 4 PER DAY   metolazone (ZAROXOLYN) 2.5 MG tablet Take 1 tablet (2.5 mg total) by mouth every 4 days AS NEEDED for swelling. Please keep upcoming appointment in July 2022 for future refills. Thank you   metoprolol tartrate (LOPRESSOR) 50 MG tablet TAKE 1 TABLET BY MOUTH TWICE A DAY   Multiple Vitamin (MULTIVITAMIN WITH MINERALS) TABS tablet Take 1 tablet by mouth daily. Centrum   nitroGLYCERIN (NITROSTAT) 0.4 MG SL tablet Place 1 tablet (0.4 mg total) under the tongue every 5 (five) minutes x 3 doses as needed for chest pain. PLEASE CALL [phone] AND SCHEDULE AN OVERDUE APPT WITH DR Acie Fredrickson FOR FURTHER REFILLS TO BE GRANTED (2ND ATTEMPT)   nystatin-triamcinolone (MYCOLOG II) cream Apply 1 application topically 2 (two) times daily as needed.   omeprazole (PRILOSEC) 20 MG capsule Take 20 mg by mouth daily.   Polyethyl Glycol-Propyl Glycol (SYSTANE) 0.4-0.3 % SOLN Place 1 drop into both eyes at bedtime.   potassium chloride SA (KLOR-CON M20) 20 MEQ tablet Take 2 tablets (40 mEq total) by mouth 2 (two) times daily.   torsemide (DEMADEX) 20 MG tablet TAKE 2 TABLETS (40 MG TOTAL) BY MOUTH 2 (TWO) TIMES DAILY.   valACYclovir (VALTREX) 1000 MG tablet TAKE 2 TABS EVERY 12 HOURS ASD NEEDED   No facility-administered encounter medications on file as of 03/23/2021.  Laresia Green CMA Clinical Pharmacist Assistant [phone]  

## 2021-03-24 ENCOUNTER — Ambulatory Visit (INDEPENDENT_AMBULATORY_CARE_PROVIDER_SITE_OTHER): Payer: PPO | Admitting: Pharmacist

## 2021-03-24 DIAGNOSIS — E1165 Type 2 diabetes mellitus with hyperglycemia: Secondary | ICD-10-CM

## 2021-03-24 DIAGNOSIS — I10 Essential (primary) hypertension: Secondary | ICD-10-CM

## 2021-03-24 NOTE — Progress Notes (Signed)
Chronic Care Management Pharmacy Note  04/04/2021 Name:  LAKEYSHA SLUTSKY MRN:  [id number] DOB:  October 23, 1945  Summary: A1c not at goal of < 7%   Recommendations/Changes made from today's visit: -Recommended to continue making dietary and lifestyle modifications to lower blood sugars -Recommended alternating checking blood sugars different times of day   Plan: Follow up in 3 months after PCP visit  Subjective: Lorre C Jenkinson is an 75 y.o. year old female who is a primary patient of [name], [name], MD.  The CCM team was consulted for assistance with disease management and care coordination needs.    Engaged with patient by telephone for follow up visit in response to provider referral for pharmacy case management and/or care coordination services.   Consent to Services:  The patient was given information about Chronic Care Management services, agreed to services, and gave verbal consent prior to initiation of services.  Please see initial visit note for detailed documentation.   Patient Care Team: [name], [name], MD as PCP - General Curt Bears, Ocie Doyne, MD as PCP - Electrophysiology (Cardiology) Nahser, Wonda Cheng, MD as PCP - Cardiology (Cardiology) Gaynelle Arabian, MD as Consulting Physician (Orthopedic Surgery) Brand Males, MD as Consulting Physician (Pulmonary Disease) Viona Gilmore, [facility] as Pharmacist (Pharmacist)  Recent office visits: 11/30/20 Charlott Rakes, LPN: Patient presented for AWV.  10/05/20 [name], [name], MD: Presented for diabetes follow- up. A1c improved to 7.1%.  Recent consult visits: 01/18/21 Constance Haw, MD Cardiology patient present for Afib follow up.  10/07/20 Constance Haw, MD Cardiology patient present for electrophysiology evaluation.   Hospital visits: Medication Reconciliation was completed by comparing discharge summary, patient's EMR and Pharmacy list, and upon discussion with patient.   Admitted to the hospital on 01.05.2022  due to Atrial Fibrillation Ablation. Discharge date was 01.05.2022. Discharged from [facility]?Medications Started at [facility] Discharge:?? -started None   Medication Changes at Hospital Discharge: -Changed None  Objective:  Lab Results  Component Value Date   CREATININE 1.25 (H) 08/13/2020   BUN 28 (H) 08/13/2020   GFR 50.24 (L) 04/10/2019   GFRNONAA 45 (L) 08/13/2020   GFRAA 40 (L) 07/28/2020   NA 140 08/13/2020   K 4.0 08/13/2020   CALCIUM 9.9 08/13/2020   CO2 25 08/13/2020   GLUCOSE 247 (H) 08/13/2020    Lab Results  Component Value Date/Time   HGBA1C 7.1 (A) 10/05/2020 10:27 AM   HGBA1C 8.1 (H) 05/19/2020 07:46 PM   HGBA1C 6.8 (H) 01/28/2020 12:01 PM   GFR 50.24 (L) 04/10/2019 08:01 AM   GFR 52.39 (L) 06/24/2018 12:57 PM   MICROALBUR <0.7 01/23/2017 10:51 AM    Last diabetic Eye exam:  Lab Results  Component Value Date/Time   HMDIABEYEEXA [place] 07/03/2009 12:00 AM    Last diabetic Foot exam: No results found for: HMDIABFOOTEX   Lab Results  Component Value Date   CHOL 151 04/10/2019   HDL 50.80 04/10/2019   LDLCALC 68 04/10/2019   TRIG 160.0 (H) 04/10/2019   CHOLHDL 3 04/10/2019    Hepatic Function Latest Ref Rng & Units 01/28/2020 04/10/2019 06/24/2018  Total Protein 6.1 - 8.1 g/dL 7.5 7.3 -  Albumin 3.5 - 5.2 g/dL - 4.4 -  AST 10 - 35 U/L 33 38(H) -  ALT 6 - 29 U/L 28 40(H) -  Alk Phosphatase 39 - 117 U/L - 125(H) 130(H)  Total Bilirubin 0.2 - 1.2 mg/dL 0.5 0.8 -  Bilirubin, Direct 0.0 - 0.2 mg/dL 0.1 0.2 -    Lab Results  Component Value Date/Time   TSH 2.77 01/28/2020 12:01 PM   TSH 3.48 04/10/2019 08:01 AM   FREET4 0.83 05/06/2018 09:23 AM   FREET4 0.82 07/24/2016 01:39 PM    CBC Latest Ref Rng & Units 06/26/2020 06/21/2020 05/28/2020  WBC 4.0 - 10.5 K/uL 6.3 9.6 9.6  Hemoglobin 12.0 - 15.0 g/dL 14.5 16.1(H) 14.6  Hematocrit 36.0 - 46.0 % 44.3 46.5 45.2  Platelets 150 - 400 K/uL 197 202 290    Lab  Results  Component Value Date/Time   VD25OH 32.83 01/23/2017 10:51 AM    Clinical ASCVD: No  The 10-year ASCVD risk score (Arnett DK, et al., 2019) is: 36.8%   Values used to calculate the score:     Age: 64 years     Sex: Female     Is Non-Hispanic African American: No     Diabetic: Yes     Tobacco smoker: No     Systolic Blood Pressure: 299 mmHg     Is BP treated: Yes     HDL Cholesterol: 50.8 mg/dL     Total Cholesterol: 151 mg/dL    Depression screen [facility] 2/9 11/30/2020 11/17/2019 05/06/2018  Decreased Interest 0 0 0  Down, Depressed, Hopeless 1 0 0  PHQ - 2 Score 1 0 0  Some recent data might be hidden     CHA2DS2/VAS Stroke Risk Points  Current as of about an hour ago     6 >= 2 Points: High Risk  1 - 1.99 Points: Medium Risk  0 Points: Low Risk    Last Change: N/A      Details    This score determines the patient's risk of having a stroke if the  patient has atrial fibrillation.       Points Metrics  1 Has Congestive Heart Failure:  Yes    Current as of about an hour ago  1 Has Vascular Disease:  Yes    Current as of about an hour ago  1 Has Hypertension:  Yes    Current as of about an hour ago  1 Age:  66    Current as of about an hour ago  1 Has Diabetes:  Yes    Current as of about an hour ago  0 Had Stroke:  No  Had TIA:  No  Had Thromboembolism:  No    Current as of about an hour ago  1 Female:  Yes    Current as of about an hour ago     Social History   Tobacco Use  Smoking Status Former   Packs/day: 1.00   Years: 5.00   Pack years: 5.00   Types: Cigarettes   Start date: 05/11/1978   Quit date: 07/03/1982   Years since quitting: 38.7  Smokeless Tobacco Never   BP Readings from Last 3 Encounters:  01/18/21 130/68  11/30/20 130/64  10/07/20 138/62   Pulse Readings from Last 3 Encounters:  01/18/21 68  11/30/20 74  10/07/20 68   Wt Readings from Last 3 Encounters:  01/18/21 255 lb (115.7 kg)  11/30/20 264 lb 9.6 oz (120 kg)  10/07/20  260 lb 6.4 oz (118.1 kg)   BMI Readings from Last 3 Encounters:  01/18/21 40.54 kg/m  11/30/20 42.71 kg/m  10/07/20 42.03 kg/m    Assessment/Interventions: Review of patient past medical history, allergies, medications, health status, including review of consultants reports,  laboratory and other test data, was performed as part of comprehensive evaluation and provision of chronic care management services.   SDOH:  (Social Determinants of Health) assessments and interventions performed: No  SDOH Screenings   Alcohol Screen: Not on file  Depression (PHQ2-9): Low Risk    PHQ-2 Score: 1  Financial Resource Strain: Low Risk    Difficulty of Paying Living Expenses: Not hard at all  Food Insecurity: No Food Insecurity   Worried About Charity fundraiser in the Last Year: Never true   Ran Out of Food in the Last Year: Never true  Housing: Low Risk    Last Housing Risk Score: 0  Physical Activity: Unknown   Days of Exercise per Week: Not on file   Minutes of Exercise per Session: 20 min  Social Connections: Engineer, building services of Communication with Friends and Family: More than three times a week   Frequency of Social Gatherings with Friends and Family: More than three times a week   Attends Religious Services: 1 to 4 times per year   Active Member of [employer] or Organizations: Yes   Attends Archivist Meetings: 1 to 4 times per year   Marital Status: Married  Stress: Stress Concern Present   Feeling of Stress : To some extent  Tobacco Use: Medium Risk   Smoking Tobacco Use: Former   Smokeless Tobacco Use: Never  Transportation Needs: No Data processing manager (Medical): No   Lack of Transportation (Non-Medical): No     CCM Care Plan  Allergies  Allergen Reactions   Hydrocodone-Guaifenesin Other (See Comments)    Confusion/ memory loss   Adhesive [Tape] Rash and Other (See Comments)    Allergic to defibrillation pads.   Pedi-Pre  Tape Spray [Wound Dressing Adhesive] Other (See Comments) and Rash    Allergic to defibrillation pads.    Medications Reviewed Today     Reviewed by Constance Haw, MD (Physician) on 01/18/21 at [place] List Status: <None>   Medication Order Taking? Sig Documenting Provider Last Dose Status Informant  Ascorbic Acid (VITAMIN C) 1000 MG tablet [id number] Yes Take 1,000 mg by mouth at bedtime.  [provider] Taking Active Self  atorvastatin (LIPITOR) 40 MG tablet [id number] Yes TAKE 1 TABLET BY MOUTH EVERY DAY Camnitz, Will Hassell Done, MD Taking Active   Calcium Carbonate-Vitamin D (CALCIUM 600+D PO) [id number] Yes Take 1 tablet by mouth daily.  [provider] Taking Active Self  diltiazem (CARDIZEM LA) 360 MG 24 hr tablet [id number] Yes Take 1 tablet (360 mg total) by mouth daily. Constance Haw, MD Taking Active   dofetilide (TIKOSYN) 250 MCG capsule [id number] Yes TAKE ONE CAPSULE BY MOUTH TWICE A DAY Sherran Needs, NP Taking Active   DULoxetine (CYMBALTA) 60 MG capsule [id number] Yes TAKE 1 CAPSULE BY MOUTH EVERY DAY [name], [name], MD Taking Active   ELIQUIS 5 MG TABS tablet [id number] Yes TAKE 1 TABLET BY MOUTH TWICE A DAY Camnitz, Will Hassell Done, MD Taking Active   fluticasone (FLONASE) 50 MCG/ACT nasal spray [id number] Yes Place 1 spray into both nostrils at bedtime. [provider] Taking Active Self  JARDIANCE 25 MG TABS tablet [id number] Yes TAKE 1 TABLET BY MOUTH DAILY BEFORE BREAKFAST. [name], [name], MD Taking Active   loratadine (CLARITIN) 10 MG tablet [id number] Yes TAKE 1 TABLET BY MOUTH EVERY DAY [name], [name], MD Taking Active   Lysine 1000 MG TABS [id number] Yes  Take 1,000 mg by mouth at bedtime.  [provider] Taking Active Self  metFORMIN (GLUCOPHAGE) 500 MG tablet [id number] Yes TAKE 1 TABLET (500 MG TOTAL) BY MOUTH 2 (TWO) TIMES DAILY WITH A MEAL. INCREASE TO 3 PER DAY AND THEN 4 PER DAY [name], [name], MD Taking Active    metolazone (ZAROXOLYN) 2.5 MG tablet [id number] Yes Take 1 tablet (2.5 mg total) by mouth every 4 days AS NEEDED for swelling. Please keep upcoming appointment in July 2022 for future refills. Thank you Constance Haw, MD Taking Active   metoprolol tartrate (LOPRESSOR) 50 MG tablet [id number] Yes TAKE 1 TABLET BY MOUTH TWICE A DAY [name], [name], MD Taking Active   Multiple Vitamin (MULTIVITAMIN WITH MINERALS) TABS tablet [id number] Yes Take 1 tablet by mouth daily. Centrum [provider] Taking Active Self  nitroGLYCERIN (NITROSTAT) 0.4 MG SL tablet [id number] Yes Place 1 tablet (0.4 mg total) under the tongue every 5 (five) minutes x 3 doses as needed for chest pain. PLEASE CALL [phone] AND SCHEDULE AN OVERDUE APPT WITH DR Acie Fredrickson FOR FURTHER REFILLS TO BE GRANTED (2ND ATTEMPT) Nahser, Wonda Cheng, MD Taking Active   nystatin-triamcinolone [facility] II) cream [id number] Yes Apply 1 application topically 2 (two) times daily as needed. [name], [name], MD Taking Active   omeprazole (PRILOSEC) 20 MG capsule [id number] Yes Take 20 mg by mouth daily. [provider] Taking Active   Polyethyl Glycol-Propyl Glycol (SYSTANE) 0.4-0.3 % SOLN [id number] Yes Place 1 drop into both eyes at bedtime. [provider] Taking Active Self  Potassium Chloride Crys ER (KLOR-CON M20 PO) [id number] Yes Take 20 mg by mouth in the morning and at bedtime. Per patient taking 2 tablets daily [provider] Taking Active   torsemide (DEMADEX) 20 MG tablet [id number] Yes TAKE 2 TABLETS (40 MG TOTAL) BY MOUTH 2 (TWO) TIMES DAILY. Nahser, Wonda Cheng, MD Taking Active   valACYclovir (VALTREX) 1000 MG tablet [id number] Yes TAKE 2 TABS EVERY 58 HOURS ASD NEEDED [name], [name], MD Taking Active             Patient Active Problem List   Diagnosis Date Noted   Type 2 diabetes mellitus with hyperlipidemia ([place]) 05/22/2020   Acute respiratory failure ([place]) 05/19/2020   Hypokalemia 05/19/2020    Secondary hypercoagulable state ([place]) 02/16/2020   CHF exacerbation ([place]) 05/31/2018   Bronchitis 05/31/2018   Primary osteoarthritis of left knee 04/23/2017   Degenerative arthritis of left knee 04/19/2017   Atypical atrial flutter ([place]) 09/30/2016   Hematoma 09/30/2016   Hypotension 09/30/2016   Persistent atrial fibrillation ([place]) 09/29/2016   Hyperlipidemia    HLD (hyperlipidemia)    Controlled type 2 diabetes mellitus without complication ([place])    Acute respiratory failure with hypoxia ([place]) 03/26/2016   DDD (degenerative disc disease), lumbar 04/05/2015   Degenerative disc disease, lumbar 04/05/2015   Severe obesity (BMI >= 40) ([place]) 02/16/2015   Fatty liver disease, nonalcoholic 37/94/4461   Cough, persistent 04/03/2014   Acute on chronic diastolic congestive heart failure ([place]) 10/07/2013   Pre-diabetes 09/24/2013   Peripheral edema 08/05/2013   Neoplasm of uncertain behavior of skin of back 11/09/2012   Edema 11/04/2012   Bleeding mole 11/04/2012   Pulmonary hypertension ([place]) 06/11/2012   Pneumonia 06/05/2012   Hypoxia 06/05/2012   Anemia 06/05/2012   Hemorrhoids 05/11/2012   Dyspepsia 05/11/2012   Medication management 04/20/2011   Positional vertigo 10/07/2010   Anticoagulant long-term use    HERPES LABIALIS 12/30/2009  INTERTRIGO 12/30/2009   Obesity 06/02/2009   Obstructive sleep apnea 10/12/2008   HYPERLIPIDEMIA 09/15/2008   SNORING 09/15/2008   Essential hypertension 06/19/2008   CAD S/P percutaneous coronary angioplasty 06/19/2008   Paroxysmal atrial fibrillation ([place]) 06/19/2008   BRADYCARDIA-TACHYCARDIA SYNDROME 06/19/2008   CEREBRAL ANEURYSM 06/19/2008   Allergic rhinitis 06/19/2008   HYPERGLYCEMIA 06/19/2008   DEPRESSION, HX OF 06/19/2008   CEREBROVASCULAR ACCIDENT, HX OF 06/19/2008   PACEMAKER, PERMANENT 06/19/2008    Immunization History  Administered Date(s) Administered   Fluad Quad(high Dose 65+) 04/10/2019   Influenza Split 04/18/2011,  05/06/2012   Influenza Whole 07/04/2007, 06/02/2009, 03/01/2010   Influenza, High Dose Seasonal PF 04/03/2014, 04/18/2016, 04/05/2017, 05/06/2018   Influenza,inj,Quad PF,6+ Mos 03/07/2013, 04/06/2015   PFIZER(Purple Top)SARS-COV-2 Vaccination 07/22/2019, 08/10/2019, 03/29/2020   Pneumococcal Conjugate-13 09/24/2013, 04/20/2020   Pneumococcal Polysaccharide-23 07/03/2005, 07/04/2007, 04/18/2011   Td 06/02/2009   Zoster Recombinat (Shingrix) 04/09/2020   Zoster, Live 07/03/2006   Patient reports she has been doing more cooking at home and working on diet changes. She has been eating salads with lunch and a lot more meat and vegetables and less bread overall. Patient has made more of an effort to eat breakfast and has been losing weight. She is down to 250 lbs.  Patient has been having issues with her pharmacy lately. She reported they filled her potassium for only 60 days and billed her for 90 days. She tried to call them to see what was going on and they would not fill it early. Called pharmacy to see if they would override it as a lost prescription.  Conditions to be addressed/monitored:  Hypertension, Hyperlipidemia, Diabetes, Atrial Fibrillation, Heart Failure, Coronary Artery Disease, Anxiety and Allergic Rhinitis  Conditions addressed this visit: Diabetes, hyperlipidemia  Care Plan : CCM Pharmacy Care Plan  Updates made by Viona Gilmore, [place] since 04/04/2021 12:00 AM     Problem: Problem: Hypertension, Hyperlipidemia, Diabetes, Atrial Fibrillation, Heart Failure, Coronary Artery Disease, Anxiety and Allergic Rhinitis      Long-Range Goal: Patient-Specific Goal   Start Date: 11/26/2020  Expected End Date: 11/26/2021  Recent Progress: On track  Priority: High  Note:   Current Barriers:  Unable to maintain control of diabetes  Pharmacist Clinical Goal(s):  Patient will achieve adherence to monitoring guidelines and medication adherence to achieve therapeutic efficacy achieve  control of diabetes as evidenced by home blood sugar readings and A1c  through collaboration with PharmD and provider.   Interventions: 1:1 collaboration with [name], [name], MD regarding development and update of comprehensive plan of care as evidenced by provider attestation and co-signature Inter-disciplinary care team collaboration (see longitudinal plan of care) Comprehensive medication review performed; medication list updated in electronic medical record  Hypertension (BP goal <130/80) -Not ideally controlled -Current treatment: Diltiazem (Cardizem CD) 346m, 1 capsule once daily  Metoprolol tartrate 50 mg 1 tablet twice daily -Medications previously tried: indapamide, losartan -Current home readings: 125/70 (couple times a week) -Current dietary habits: did not discuss -Current exercise habits: limited due to shortness of breath -Denies hypotensive/hypertensive symptoms -Educated on Importance of home blood pressure monitoring; Proper BP monitoring technique; -Counseled to monitor BP at home weekly, document, and provide log at future appointments -Counseled on diet and exercise extensively Recommended to continue current medication  Hyperlipidemia: (LDL goal < 70) -Controlled -Current treatment: Atorvastatin 411m 1 tablet once daily -Medications previously tried: none  -Current dietary patterns: did not discuss -Current exercise habits: limited due to shortness of breath -Educated on Cholesterol  goals;  Importance of limiting foods high in cholesterol; -Counseled on diet and exercise extensively Recommended to continue current medication Recommended repeat lipid panel as patient is overdue  CAD (Goal: prevent heart attacks and strokes) -Controlled -Current treatment  Atorvastatin 24m, 1 tablet daily  Nitroglycerin 0.457mSL tablet, place 1 tablet under tongue every five minutes for 3 doses as needed for chest pain  -Medications previously tried: none  -Recommended  to continue current medication   Diabetes (A1c goal <7%) -Not ideally controlled -Current medications: Metformin 500 mg 2 tablets twice daily Jardiance 25 mg 1 tablet daily -Medications previously tried: FaIrancost)  -Current home glucose readings fasting glucose: 209, 157, 150s post prandial glucose: n/a -Denies hypoglycemic/hyperglycemic symptoms -Current meal patterns:  breakfast: premier protein shake with fruit lunch: salad dinner: steak, salad and bread snacks: did not discuss drinks: did not discuss -Current exercise: limited with shortness of breath -Educated on A1c and blood sugar goals; Benefits of routine self-monitoring of blood sugar; Carbohydrate counting and/or plate method -Counseled to check feet daily and get yearly eye exams -Counseled on diet and exercise extensively Recommended to continue current medication  Atrial Fibrillation (Goal: prevent stroke and major bleeding) -Controlled -CHADSVASC: 6 -Current treatment: Rate control: Diltiazem (Cardizem CD) 36015m1 capsule once daily, dofetilide 250 mcg 1 capsule twice daily Anticoagulation: Eliquis 5mg13m tablet twice daily  -Medications previously tried: dronedarone (patient reported retaining fluid), amiodarone, dofetilide, sotalol  -Home BP and HR readings: did not discuss  -Counseled on importance of adherence to anticoagulant exactly as prescribed; avoidance of NSAIDs due to increased bleeding risk with anticoagulants; -Recommended to continue current medication  Heart Failure (Goal: manage symptoms and prevent exacerbations) -Controlled -Last ejection fraction: 60-65% (Date: 05/20/2020) -HF type: Diastolic -NYHA Class: III (marked limitation of activity) -AHA HF Stage: C (Heart disease and symptoms present) -Current treatment: Torsemide 20mg71mtablets twice daily  Klor-con 20mEq61mtablet twice daily Metoprolol tartrate 50 mg 1 tablet twice daily Metolazone 2.5 mg 1 tablet every 4 days as  needed -Medications previously tried: n/a  -Current home BP/HR readings: did not discuss -Current dietary habits: limiting salt intake -Current exercise habits: limited due to shortness of breath -Educated on Importance of weighing daily; if you gain more than 3 pounds in one day or 5 pounds in one week, call cardiologist -Recommended to continue current medication  Stress/Anxiety (Goal: minimize symptoms) -Controlled -Current treatment: Duloxetine 60mg,12msule once daily -Medications previously tried/failed: fluoxetine -PHQ9: 0 -GAD7: n/a -Educated on Benefits of medication for symptom control -Recommended to continue current medication  Allergic rhinitis (Goal: minimize symptoms) -Controlled -Current treatment  Fluticasone (Flonase) 50mcg/ 3mnasal spray, 1 spray into both nostrils daily  Loratadine 10mg,1 t59mt once daily -Medications previously tried: none  -Recommended to continue current medication  GERD (Goal: minimize symptoms) -Controlled -Current treatment  Omeprazole 20mg, 1 c49mle once daily before breakfast -Medications previously tried: none  -Recommended to continue current medication Patient is taking for stomach pain and reports it is helping  Bone health (Goal prevent fractures) -Controlled -Last DEXA Scan: 04/08/2014  T-Score femoral neck: 0.85  T-Score total hip: n/a  T-Score lumbar spine: n/a  T-Score forearm radius: n/a  10-year probability of major osteoporotic fracture: 5.9%  10-year probability of hip fracture: 0.2% -Patient is not a candidate for pharmacologic treatment -Current treatment  Calcium carbonate/ vitamin D, 1 tablet at bedtime Cholecalciferol (125mcg)  5073mnits, 1 tablet every evening -Medications previously tried: none  -Recommend [phone] units of vitamin  D daily. Recommend 1200 mg of calcium daily from dietary and supplemental sources. Recommend weight-bearing and muscle strengthening exercises for building and maintaining  bone density. -Recommended to continue current medication  Health Maintenance -Vaccine gaps: Shingrix -Current therapy:  Ascorbic acid 1024m, 1 tablet once daily APAP 5041m 2 in the AM, and 2 in the PM  Diphenhydramine/ APAP, 25-50076m2 tablets at bedtime  Lysine 1000m53m tablet at bedtime  Multivitamin, 1 tablet once daily  Systane, 1 drop into both eyes twice daily as needed Valacyclovir 1000mg28mtablet every twelve hours as needed for fever blisters/ cold sores  Vitamin B12 1000mcg36mtablet once daily Vitamin E (670mg) 22m units, 1 capsule at bedtime  -Educated on Cost vs benefit of each product must be carefully weighed by individual consumer -Patient is satisfied with current therapy and denies issues -Recommended to continue current medication  Patient Goals/Self-Care Activities Patient will:  - take medications as prescribed check glucose daily, document, and provide at future appointments engage in dietary modifications by following healthy plate model  Follow Up Plan: Telephone follow up appointment with care management team member scheduled for: 3 months      Medication Assistance: None required.  Patient affirms current coverage meets needs.  Compliance/Adherence/Medication fill history: Care Gaps: Shingrix - 2nd dose, colonoscopy, eye exam, urine microalbumin, tetanus, COVID booster, influenza  Star-Rating Drugs: Metformin (Glucophage) 500 mg - [facility]20[facility]2021 [facility] Empagliflozin (Jardiance) 25 mg - Last filled [facility]-12-2021 [facility] Atorvastatin (Lipitor) 40 mg - Last filled [facility]-11-2021 [facility]  [facility]:  [id number] [phone] to Registered Caremark Sites [phone]-[id number]AWNDALEBenton HeightsWNDALEPort [phone] box? Yes - patient uses weekly pill box for 2 weeks Pt endorses 100% compliance  We discussed: Benefits of medication synchronization, packaging and delivery as well as enhanced pharmacist oversight with [facility]. Patient decided to: Utilize [facility] pharmacy for medication synchronization, packaging and delivery  Care Plan and Follow Up Patient Decision:  Patient agrees to Care Plan and Follow-up.  Plan: Telephone follow up appointment with care management team member scheduled for:  3 months  MadelineJeni Salles BCACP ClWynnedaleist [phone]

## 2021-04-01 DIAGNOSIS — I10 Essential (primary) hypertension: Secondary | ICD-10-CM

## 2021-04-01 DIAGNOSIS — E1165 Type 2 diabetes mellitus with hyperglycemia: Secondary | ICD-10-CM

## 2021-04-04 NOTE — Patient Instructions (Signed)
Visit Information   Goals Addressed   None    Patient Care Plan: CCM Pharmacy Care Plan     Problem Identified: Problem: Hypertension, Hyperlipidemia, Diabetes, Atrial Fibrillation, Heart Failure, Coronary Artery Disease, Anxiety and Allergic Rhinitis      Long-Range Goal: Patient-Specific Goal   Start Date: 11/26/2020  Expected End Date: 11/26/2021  Recent Progress: On track  Priority: High  Note:   Current Barriers:  Unable to maintain control of diabetes  Pharmacist Clinical Goal(s):  Patient will achieve adherence to monitoring guidelines and medication adherence to achieve therapeutic efficacy achieve control of diabetes as evidenced by home blood sugar readings and A1c  through collaboration with PharmD and provider.   Interventions: 1:1 collaboration with [name], [name], MD regarding development and update of comprehensive plan of care as evidenced by provider attestation and co-signature Inter-disciplinary care team collaboration (see longitudinal plan of care) Comprehensive medication review performed; medication list updated in electronic medical record  Hypertension (BP goal <130/80) -Not ideally controlled -Current treatment: Diltiazem (Cardizem CD) 360mg , 1 capsule once daily  Metoprolol tartrate 50 mg 1 tablet twice daily -Medications previously tried: indapamide, losartan -Current home readings: 125/70 (couple times a week) -Current dietary habits: did not discuss -Current exercise habits: limited due to shortness of breath -Denies hypotensive/hypertensive symptoms -Educated on Importance of home blood pressure monitoring; Proper BP monitoring technique; -Counseled to monitor BP at home weekly, document, and provide log at future appointments -Counseled on diet and exercise extensively Recommended to continue current medication  Hyperlipidemia: (LDL goal < 70) -Controlled -Current treatment: Atorvastatin 40mg , 1 tablet once daily -Medications previously  tried: none  -Current dietary patterns: did not discuss -Current exercise habits: limited due to shortness of breath -Educated on Cholesterol goals;  Importance of limiting foods high in cholesterol; -Counseled on diet and exercise extensively Recommended to continue current medication Recommended repeat lipid panel as patient is overdue  CAD (Goal: prevent heart attacks and strokes) -Controlled -Current treatment  Atorvastatin 40mg , 1 tablet daily  Nitroglycerin 0.4mg  SL tablet, place 1 tablet under tongue every five minutes for 3 doses as needed for chest pain  -Medications previously tried: none  -Recommended to continue current medication   Diabetes (A1c goal <7%) -Not ideally controlled -Current medications: Metformin 500 mg 2 tablets twice daily Jardiance 25 mg 1 tablet daily -Medications previously tried: [place] (cost)  -Current home glucose readings fasting glucose: 209, 157, 150s post prandial glucose: n/a -Denies hypoglycemic/hyperglycemic symptoms -Current meal patterns:  breakfast: premier protein shake with fruit lunch: salad dinner: steak, salad and bread snacks: did not discuss drinks: did not discuss -Current exercise: limited with shortness of breath -Educated on A1c and blood sugar goals; Benefits of routine self-monitoring of blood sugar; Carbohydrate counting and/or plate method -Counseled to check feet daily and get yearly eye exams -Counseled on diet and exercise extensively Recommended to continue current medication  Atrial Fibrillation (Goal: prevent stroke and major bleeding) -Controlled -CHADSVASC: 6 -Current treatment: Rate control: Diltiazem (Cardizem CD) 360mg , 1 capsule once daily, dofetilide 250 mcg 1 capsule twice daily Anticoagulation: Eliquis 5mg , 1 tablet twice daily  -Medications previously tried: dronedarone (patient reported retaining fluid), amiodarone, dofetilide, sotalol  -Home BP and HR readings: did not discuss  -Counseled  on importance of adherence to anticoagulant exactly as prescribed; avoidance of NSAIDs due to increased bleeding risk with anticoagulants; -Recommended to continue current medication  Heart Failure (Goal: manage symptoms and prevent exacerbations) -Controlled -Last ejection fraction: 60-65% (Date: 05/20/2020) -HF type: Diastolic -NYHA  Class: III (marked limitation of activity) -AHA HF Stage: C (Heart disease and symptoms present) -Current treatment: Torsemide 20mg , 2 tablets twice daily  Klor-con 29mEq, 1 tablet twice daily Metoprolol tartrate 50 mg 1 tablet twice daily Metolazone 2.5 mg 1 tablet every 4 days as needed -Medications previously tried: n/a  -Current home BP/HR readings: did not discuss -Current dietary habits: limiting salt intake -Current exercise habits: limited due to shortness of breath -Educated on Importance of weighing daily; if you gain more than 3 pounds in one day or 5 pounds in one week, call cardiologist -Recommended to continue current medication  Stress/Anxiety (Goal: minimize symptoms) -Controlled -Current treatment: Duloxetine 60mg ,1 capsule once daily -Medications previously tried/failed: fluoxetine -PHQ9: 0 -GAD7: n/a -Educated on Benefits of medication for symptom control -Recommended to continue current medication  Allergic rhinitis (Goal: minimize symptoms) -Controlled -Current treatment  Fluticasone (Flonase) 9mcg/ act nasal spray, 1 spray into both nostrils daily  Loratadine 10mg ,1 tablet once daily -Medications previously tried: none  -Recommended to continue current medication  GERD (Goal: minimize symptoms) -Controlled -Current treatment  Omeprazole 20mg , 1 capsule once daily before breakfast -Medications previously tried: none  -Recommended to continue current medication Patient is taking for stomach pain and reports it is helping  Bone health (Goal prevent fractures) -Controlled -Last DEXA Scan: 04/08/2014  T-Score femoral  neck: 0.85  T-Score total hip: n/a  T-Score lumbar spine: n/a  T-Score forearm radius: n/a  10-year probability of major osteoporotic fracture: 5.9%  10-year probability of hip fracture: 0.2% -Patient is not a candidate for pharmacologic treatment -Current treatment  Calcium carbonate/ vitamin D, 1 tablet at bedtime Cholecalciferol (164mcg)  5000 units, 1 tablet every evening -Medications previously tried: none  -Recommend [phone] units of vitamin D daily. Recommend 1200 mg of calcium daily from dietary and supplemental sources. Recommend weight-bearing and muscle strengthening exercises for building and maintaining bone density. -Recommended to continue current medication  Health Maintenance -Vaccine gaps: Shingrix -Current therapy:  Ascorbic acid 1000mg , 1 tablet once daily APAP 500mg , 2 in the AM, and 2 in the PM  Diphenhydramine/ APAP, 25-500mg , 2 tablets at bedtime  Lysine 1000mg , 1 tablet at bedtime  Multivitamin, 1 tablet once daily  Systane, 1 drop into both eyes twice daily as needed Valacyclovir 1000mg , 2 tablet every twelve hours as needed for fever blisters/ cold sores  Vitamin B12 1055mcg, 1 tablet once daily Vitamin E (670mg ) 1000 units, 1 capsule at bedtime  -Educated on Cost vs benefit of each product must be carefully weighed by individual consumer -Patient is satisfied with current therapy and denies issues -Recommended to continue current medication  Patient Goals/Self-Care Activities Patient will:  - take medications as prescribed check glucose daily, document, and provide at future appointments engage in dietary modifications by following healthy plate model  Follow Up Plan: Telephone follow up appointment with care management team member scheduled for: 3 months       Patient verbalizes understanding of instructions provided today and agrees to view in [place].  The pharmacy team will reach out to the patient again over the next 30 days.   Viona Gilmore, [facility]

## 2021-04-08 ENCOUNTER — Other Ambulatory Visit: Payer: Self-pay | Admitting: Internal Medicine

## 2021-04-13 ENCOUNTER — Other Ambulatory Visit: Payer: Self-pay

## 2021-04-13 ENCOUNTER — Ambulatory Visit (INDEPENDENT_AMBULATORY_CARE_PROVIDER_SITE_OTHER): Payer: PPO

## 2021-04-13 DIAGNOSIS — I495 Sick sinus syndrome: Secondary | ICD-10-CM | POA: Diagnosis not present

## 2021-04-13 LAB — CUP PACEART INCLINIC DEVICE CHECK
Battery Impedance: 3400 Ohm
Battery Voltage: 2.76 V
Brady Statistic RA Percent Paced: 6.1 %
Brady Statistic RV Percent Paced: 1 % — CL
Date Time Interrogation Session: 20221012104758
Implantable Lead Implant Date: 20081211
Implantable Lead Implant Date: 20081211
Implantable Lead Location: 753859
Implantable Lead Location: 753860
Implantable Pulse Generator Implant Date: 20081211
Lead Channel Impedance Value: 453 Ohm
Lead Channel Impedance Value: 474 Ohm
Lead Channel Pacing Threshold Amplitude: 1 V
Lead Channel Pacing Threshold Amplitude: 1.75 V
Lead Channel Pacing Threshold Pulse Width: 0.5 ms
Lead Channel Pacing Threshold Pulse Width: 0.7 ms
Lead Channel Sensing Intrinsic Amplitude: 12 mV
Lead Channel Sensing Intrinsic Amplitude: 2.9 mV
Lead Channel Setting Pacing Amplitude: 2 V
Lead Channel Setting Pacing Amplitude: 3.5 V
Lead Channel Setting Pacing Pulse Width: 0.7 ms
Lead Channel Setting Sensing Sensitivity: 2.5 mV
Pulse Gen Model: 5826
Pulse Gen Serial Number: 1999089

## 2021-04-13 NOTE — Progress Notes (Signed)
Pacemaker check in clinic. Normal device function. Thresholds, sensing, impedances consistent with previous measurements. Device programmed to maximize longevity. AT/AF burden 2%, no EGM's available. All appear < 8 minutes. No high ventricular rates noted. Device programmed at appropriate safety margins. Histogram distribution appropriate for patient activity level. Device programmed to optimize intrinsic conduction. Estimated longevity 1.5-4.5 years. Patient is not remote capable. Patient education completed. ROV w/ Dr. Curt Bears 07/2021.

## 2021-04-20 ENCOUNTER — Encounter: Payer: Self-pay | Admitting: Internal Medicine

## 2021-04-20 ENCOUNTER — Ambulatory Visit (INDEPENDENT_AMBULATORY_CARE_PROVIDER_SITE_OTHER): Payer: PPO | Admitting: Internal Medicine

## 2021-04-20 ENCOUNTER — Other Ambulatory Visit: Payer: Self-pay

## 2021-04-20 VITALS — BP 114/62 | HR 67 | Temp 98.0°F | Ht 66.5 in | Wt 251.6 lb

## 2021-04-20 DIAGNOSIS — I5032 Chronic diastolic (congestive) heart failure: Secondary | ICD-10-CM

## 2021-04-20 DIAGNOSIS — Z79899 Other long term (current) drug therapy: Secondary | ICD-10-CM | POA: Diagnosis not present

## 2021-04-20 DIAGNOSIS — I10 Essential (primary) hypertension: Secondary | ICD-10-CM

## 2021-04-20 DIAGNOSIS — I25118 Atherosclerotic heart disease of native coronary artery with other forms of angina pectoris: Secondary | ICD-10-CM

## 2021-04-20 DIAGNOSIS — Z23 Encounter for immunization: Secondary | ICD-10-CM

## 2021-04-20 DIAGNOSIS — E1165 Type 2 diabetes mellitus with hyperglycemia: Secondary | ICD-10-CM | POA: Diagnosis not present

## 2021-04-20 DIAGNOSIS — R198 Other specified symptoms and signs involving the digestive system and abdomen: Secondary | ICD-10-CM | POA: Diagnosis not present

## 2021-04-20 DIAGNOSIS — E785 Hyperlipidemia, unspecified: Secondary | ICD-10-CM

## 2021-04-20 DIAGNOSIS — R110 Nausea: Secondary | ICD-10-CM

## 2021-04-20 LAB — LIPID PANEL
Cholesterol: 143 mg/dL (ref 0–200)
HDL: 40.3 mg/dL (ref >39.00)
NonHDL: 102.31
Total CHOL/HDL Ratio: 4
Triglycerides: 226 mg/dL — ABNORMAL HIGH (ref 0.0–149.0)
VLDL: 45.2 mg/dL — ABNORMAL HIGH (ref 0.0–40.0)

## 2021-04-20 LAB — BASIC METABOLIC PANEL
BUN: 31 mg/dL — ABNORMAL HIGH (ref 6–23)
CO2: 32 mEq/L (ref 19–32)
Calcium: 10.1 mg/dL (ref 8.4–10.5)
Chloride: 95 mEq/L — ABNORMAL LOW (ref 96–112)
Creatinine, Ser: 1.4 mg/dL — ABNORMAL HIGH (ref 0.40–1.20)
GFR: 36.88 mL/min — ABNORMAL LOW (ref >60.00)
Glucose, Bld: 178 mg/dL — ABNORMAL HIGH (ref 70–99)
Potassium: 3.6 mEq/L (ref 3.5–5.1)
Sodium: 139 mEq/L (ref 135–145)

## 2021-04-20 LAB — LDL CHOLESTEROL, DIRECT: Direct LDL: 81 mg/dL

## 2021-04-20 LAB — CBC WITH DIFFERENTIAL/PLATELET
Basophils Absolute: 0.1 10*3/uL (ref 0.0–0.1)
Basophils Relative: 1.1 % (ref 0.0–3.0)
Eosinophils Absolute: 0.4 10*3/uL (ref 0.0–0.7)
Eosinophils Relative: 4.4 % (ref 0.0–5.0)
HCT: 46.2 % — ABNORMAL HIGH (ref 36.0–46.0)
Hemoglobin: 14.8 g/dL (ref 12.0–15.0)
Lymphocytes Relative: 25.8 % (ref 12.0–46.0)
Lymphs Abs: 2.2 10*3/uL (ref 0.7–4.0)
MCHC: 32 g/dL (ref 30.0–36.0)
MCV: 94.8 fl (ref 78.0–100.0)
Monocytes Absolute: 0.9 10*3/uL (ref 0.1–1.0)
Monocytes Relative: 10.2 % (ref 3.0–12.0)
Neutro Abs: 4.9 10*3/uL (ref 1.4–7.7)
Neutrophils Relative %: 58.5 % (ref 43.0–77.0)
Platelets: 283 10*3/uL (ref 150.0–400.0)
RBC: 4.88 Mil/uL (ref 3.87–5.11)
RDW: 14.2 % (ref 11.5–15.5)
WBC: 8.5 10*3/uL (ref 4.0–10.5)

## 2021-04-20 LAB — T4, FREE: Free T4: 0.86 ng/dL (ref 0.60–1.60)

## 2021-04-20 LAB — HEPATIC FUNCTION PANEL
ALT: 69 U/L — ABNORMAL HIGH (ref 0–35)
AST: 60 U/L — ABNORMAL HIGH (ref 0–37)
Albumin: 4.3 g/dL (ref 3.5–5.2)
Alkaline Phosphatase: 86 U/L (ref 39–117)
Bilirubin, Direct: 0.1 mg/dL (ref 0.0–0.3)
Total Bilirubin: 0.6 mg/dL (ref 0.2–1.2)
Total Protein: 7.7 g/dL (ref 6.0–8.3)

## 2021-04-20 LAB — POC URINALSYSI DIPSTICK (AUTOMATED)
Bilirubin, UA: NEGATIVE
Blood, UA: NEGATIVE
Glucose, UA: POSITIVE — AB
Ketones, UA: NEGATIVE
Leukocytes, UA: NEGATIVE
Nitrite, UA: NEGATIVE
Protein, UA: NEGATIVE
Spec Grav, UA: 1.015 (ref 1.010–1.025)
Urobilinogen, UA: 0.2 E.U./dL
pH, UA: 6 (ref 5.0–8.0)

## 2021-04-20 LAB — HEMOGLOBIN A1C: Hgb A1c MFr Bld: 8.2 % — ABNORMAL HIGH (ref 4.6–6.5)

## 2021-04-20 LAB — TSH: TSH: 4.37 u[IU]/mL (ref 0.35–5.50)

## 2021-04-20 NOTE — Progress Notes (Signed)
Chief Complaint  Patient presents with   Follow-up   Medication Management   Diabetes   Nausea    HPI: [name] 75 y.o. come in for Chronic disease management  Dm CHF resp  but  since last months or having GI symptoms stomach problems it has been difficult for her she would describe it as a minor nausea or appetite change and trying to change different foods to see if it made a difference there is no vomiting but feels nauseous no change in bowel habits in general except occasional diarrhea 2-3 times a week. No new medicines except what is written continues to take the metformin Blood sugars not as good in the 200s. Husband had hip replacement .  He has been home helping with his rehab. Thinks her respiratory heart is stable is able to take extra diuretic every 4 days. ROS: See pertinent positives and negatives per HPI.  Past Medical History:  Diagnosis Date   Anticoagulant long-term use    pradaxa   Anxiety    Arthritis    "fingers, lower back" (04/23/2017    CAD (coronary artery disease) [phone],[phone]   post PTCA with bare-metal stenting to mid RCA in December 2004      CHF (congestive heart failure) (HCC)    Chronic atrial fibrillation ([place]) 06/2007   Tachybradycardia pacemaker   Chronic kidney disease    10% function - ?R, other kidney is compensating     CVA (cerebral vascular accident) [facility]) [phone]   denies residual on 04/23/2017   Depression    Diplopia 06/19/2008   Qualifier: Diagnosis of  By: [name] MD, [name]    Dysrhythmia    ATRIAL FIBRILATION   Edema of lower extremity    Hyperlipidemia    Hypertension    Inferior myocardial infarction ([place])    acute inferior wall mi/other medical hx   Myocardial infarction [facility]) [phone]   Obesity    OSA on CPAP    last test- 10-03-08   Pacemaker    Pneumonia 10/03/2012   tx. ----  [facility]   Pulmonary hypertension ([place])    moderate pulmonary hypertension by 10/2016 echo and 10/2013 cardiac cath   Shortness of  breath    Skin cancer    "cut off right [place]; burned off LLE" (04/23/2017)   Sleep apnea    Spondylolisthesis    TIA (transient ischemic attack) 10-04-2006   Unspecified hemorrhoids without mention of complication 2/70/6237   Colonoscopy--Dr. Carlean Purl     Family History  Problem Relation Age of Onset   Suicidality Father        suicide death pt was 3 yrs, [birth year]48   Arrhythmia Mother    Hypertension Mother    Diabetes Mother    Dementia Mother    Heart attack Brother    Heart disease Paternal Aunt    Prostate cancer Maternal Grandfather    Diabetes Paternal Grandfather        fathers side of the family   Colon cancer Maternal Aunt     Social History   Socioeconomic History   Marital status: Married    Spouse name: Not on file   Number of children: 0   Years of education: HS   Highest education level: Not on file  Occupational History   Occupation: retired    Comment: previously worked [employer]  Tobacco Use   Smoking status: Former    Packs/day: 1.00    Years: 5.00    Pack  years: 5.00    Types: Cigarettes    Start date: 05/11/1978    Quit date: 07/03/1982    Years since quitting: 38.8   Smokeless tobacco: Never  Vaping Use   Vaping Use: Never used  Substance and Sexual Activity   Alcohol use: Yes    Comment: 04/23/2017 "once q 6 months; glass of wine"   Drug use: No   Sexual activity: Not Currently  Other Topics Concern   Not on file  Social History Narrative   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   Married    Originally from [place] of two, high school education   Former smoker 1979-1984 1ppd   Hunting dogs 7    Retired from [place] 2    Social Determinants of Sales executive: Low Risk    Difficulty of Paying Living Expenses: Not hard at [employer] Insecurity: No Food Insecurity   Worried About Charity fundraiser in the Last Year: Never true   Arboriculturist in the Last  Year: Never true  Transportation Needs: No Data processing manager (Medical): No   Lack of Transportation (Non-Medical): No  Physical Activity: Unknown   Days of Exercise per Week: Not on file   Minutes of Exercise per Session: 20 min  Stress: Stress Concern Present   Feeling of Stress : To some extent  Social Connections: Engineer, building services of Communication with Friends and Family: More than three times a week   Frequency of Social Gatherings with Friends and Family: More than three times a week   Attends Religious Services: 1 to 4 times per year   Active Member of [employer] or Organizations: Yes   Attends Archivist Meetings: 1 to 4 times per year   Marital Status: Married    Outpatient Medications Prior to Visit  Medication Sig Dispense Refill   Ascorbic Acid (VITAMIN C) 1000 MG tablet Take 1,000 mg by mouth at bedtime.      atorvastatin (LIPITOR) 40 MG tablet TAKE 1 TABLET BY MOUTH EVERY DAY 90 tablet 3   Calcium Carbonate-Vitamin D (CALCIUM 600+D PO) Take 1 tablet by mouth daily.      diltiazem (CARDIZEM LA) 360 MG 24 hr tablet Take 1 tablet (360 mg total) by mouth daily. 90 tablet 3   dofetilide (TIKOSYN) 250 MCG capsule TAKE ONE CAPSULE BY MOUTH TWICE A DAY 180 capsule 2   DULoxetine (CYMBALTA) 60 MG capsule TAKE 1 CAPSULE BY MOUTH EVERY DAY 90 capsule 0   ELIQUIS 5 MG TABS tablet TAKE 1 TABLET BY MOUTH TWICE A DAY 180 tablet 2   empagliflozin (JARDIANCE) 25 MG TABS tablet Take 1 tablet (25 mg total) by mouth daily. 30 tablet 3   fluticasone (FLONASE) 50 MCG/ACT nasal spray Place 1 spray into both nostrils at bedtime.     loratadine (CLARITIN) 10 MG tablet TAKE 1 TABLET BY MOUTH EVERY DAY 90 tablet 0   Lysine 1000 MG TABS Take 1,000 mg by mouth at bedtime.      metFORMIN (GLUCOPHAGE) 500 MG tablet TAKE 1 TABLET (500 MG TOTAL) BY MOUTH 2 (TWO) TIMES DAILY WITH A MEAL. INCREASE TO 3 PER DAY AND THEN 4 PER DAY 360 tablet 0   metolazone  (ZAROXOLYN) 2.5 MG tablet Take 1 tablet (2.5 mg total) by mouth every 4 days AS NEEDED for swelling.  Please keep upcoming appointment in July 2022 for future refills. Thank you 21 tablet 0   metoprolol tartrate (LOPRESSOR) 50 MG tablet TAKE 1 TABLET BY MOUTH TWICE A DAY 180 tablet 0   Multiple Vitamin (MULTIVITAMIN WITH MINERALS) TABS tablet Take 1 tablet by mouth daily. Centrum     nitroGLYCERIN (NITROSTAT) 0.4 MG SL tablet Place 1 tablet (0.4 mg total) under the tongue every 5 (five) minutes x 3 doses as needed for chest pain. PLEASE CALL [phone] AND SCHEDULE AN OVERDUE APPT WITH DR Acie Fredrickson FOR FURTHER REFILLS TO BE GRANTED (2ND ATTEMPT) 25 tablet 0   nystatin-triamcinolone (MYCOLOG II) cream Apply 1 application topically 2 (two) times daily as needed. 30 g 1   omeprazole (PRILOSEC) 20 MG capsule Take 20 mg by mouth daily.     Polyethyl Glycol-Propyl Glycol (SYSTANE) 0.4-0.3 % SOLN Place 1 drop into both eyes at bedtime.     potassium chloride SA (KLOR-CON M20) 20 MEQ tablet Take 2 tablets (40 mEq total) by mouth 2 (two) times daily. 120 tablet 6   torsemide (DEMADEX) 20 MG tablet TAKE 2 TABLETS (40 MG TOTAL) BY MOUTH 2 (TWO) TIMES DAILY. 360 tablet 3   valACYclovir (VALTREX) 1000 MG tablet TAKE 2 TABS EVERY 12 HOURS ASD NEEDED 30 tablet 1   No facility-administered medications prior to visit.     EXAM:  BP 114/62 (BP Location: Left Arm, Patient Position: Sitting, Cuff Size: Large)   Pulse 67   Temp 98 F (36.7 C) (Oral)   Ht 5' 6.5" (1.689 m)   Wt 251 lb 9.6 oz (114.1 kg)   SpO2 91%   BMI 40.00 kg/m   Body mass index is 40 kg/m.  GENERAL: vitals reviewed and listed above, alert, oriented, appears well hydrated and in no acute distress HEENT: atraumatic, conjunctiva  clear, no obvious abnormalities on inspection of external nose and ears OP : masked  NECK: no obvious masses on inspection palpation  LUNGS: clear to auscultation bilaterally, no rales or rhonchi, rare wheeze  t CV: HRRR, no clubbing cyanosis nl cap refill  Abdomen soft without again a megaly guarding or rebound although liver at  and just below right costal margin MS: moves all extremities without noticeable focal  abnormality PSYCH: pleasant and cooperative, no obvious depression or anxiety Lab Results  Component Value Date   WBC 8.5 04/20/2021   HGB 14.8 04/20/2021   HCT 46.2 (H) 04/20/2021   PLT 283.0 04/20/2021   GLUCOSE 178 (H) 04/20/2021   CHOL 143 04/20/2021   TRIG 226.0 (H) 04/20/2021   HDL 40.30 04/20/2021   LDLDIRECT 81.0 04/20/2021   LDLCALC 68 04/10/2019   ALT 69 (H) 04/20/2021   AST 60 (H) 04/20/2021   NA 139 04/20/2021   K 3.6 04/20/2021   CL 95 (L) 04/20/2021   CREATININE 1.40 (H) 04/20/2021   BUN 31 (H) 04/20/2021   CO2 32 04/20/2021   TSH 4.37 04/20/2021   INR 1.3 (H) 02/17/2020   HGBA1C 8.2 (H) 04/20/2021   MICROALBUR <0.7 01/23/2017   BP Readings from Last 3 Encounters:  04/20/21 114/62  01/18/21 130/68  11/30/20 130/64    ASSESSMENT AND PLAN:  Discussed the following assessment and plan:  Queasiness - Plan: Basic metabolic panel, CBC with Differential/Platelet, Hemoglobin A1c, Hepatic function panel, Lipid panel, TSH, POCT Urinalysis Dipstick (Automated), T4, free, T4, free, TSH, Lipid panel, Hepatic function panel, Hemoglobin A1c, CBC with Differential/Platelet, Basic metabolic panel  Type 2 diabetes mellitus with hyperglycemia, without long-term  current use of insulin ([place]) - Plan: Basic metabolic panel, CBC with Differential/Platelet, Hemoglobin A1c, Hepatic function panel, Lipid panel, TSH, POCT Urinalysis Dipstick (Automated), T4, free, T4, free, TSH, Lipid panel, Hepatic function panel, Hemoglobin A1c, CBC with Differential/Platelet, Basic metabolic panel  Essential hypertension - Plan: Basic metabolic panel, CBC with Differential/Platelet, Hemoglobin A1c, Hepatic function panel, Lipid panel, TSH, POCT Urinalysis Dipstick (Automated), T4, free, T4,  free, TSH, Lipid panel, Hepatic function panel, Hemoglobin A1c, CBC with Differential/Platelet, Basic metabolic panel  Chronic diastolic heart failure (HCC) - Plan: Basic metabolic panel, CBC with Differential/Platelet, Hemoglobin A1c, Hepatic function panel, Lipid panel, TSH, POCT Urinalysis Dipstick (Automated), T4, free, T4, free, TSH, Lipid panel, Hepatic function panel, Hemoglobin A1c, CBC with Differential/Platelet, Basic metabolic panel  Coronary artery disease of native heart with stable angina pectoris, unspecified vessel or lesion type (HCC) - Plan: Basic metabolic panel, CBC with Differential/Platelet, Hemoglobin A1c, Hepatic function panel, Lipid panel, TSH, POCT Urinalysis Dipstick (Automated), T4, free, T4, free, TSH, Lipid panel, Hepatic function panel, Hemoglobin A1c, CBC with Differential/Platelet, Basic metabolic panel  Medication management - Plan: Basic metabolic panel, CBC with Differential/Platelet, Hemoglobin A1c, Hepatic function panel, Lipid panel, TSH, POCT Urinalysis Dipstick (Automated), T4, free, T4, free, TSH, Lipid panel, Hepatic function panel, Hemoglobin A1c, CBC with Differential/Platelet, Basic metabolic panel  Abdominal symptoms - Plan: POCT Urinalysis Dipstick (Automated)  Need for immunization against influenza - Plan: Flu Vaccine QUAD High Dose(Fluad) In short ROM increase the omeprazole to twice daily await metabolic information sitter GI help May need to add occasions to help with diabetes control. -Patient advised to return or notify health care team  if  new concerns arise.  Patient Instructions   Getting lab work to day help  to delineate  cause of your  nausea queasiness. Sometimes medication  diabetes can do this .  Consider getting the GI team to see you for help .   Wt Readings from Last 3 Encounters:  04/20/21 251 lb 9.6 oz (114.1 kg)  01/18/21 255 lb (115.7 kg)  11/30/20 264 lb 9.6 oz (120 kg)     [name] K. Angelik Walls M.D.

## 2021-04-20 NOTE — Patient Instructions (Addendum)
  Getting lab work to day help  to delineate  cause of your  nausea queasiness. Sometimes medication  diabetes can do this .  Consider getting the GI team to see you for help .   Wt Readings from Last 3 Encounters:  04/20/21 251 lb 9.6 oz (114.1 kg)  01/18/21 255 lb (115.7 kg)  11/30/20 264 lb 9.6 oz (120 kg)

## 2021-04-26 ENCOUNTER — Other Ambulatory Visit: Payer: Self-pay | Admitting: Internal Medicine

## 2021-05-04 DIAGNOSIS — L821 Other seborrheic keratosis: Secondary | ICD-10-CM | POA: Diagnosis not present

## 2021-05-04 DIAGNOSIS — Z85828 Personal history of other malignant neoplasm of skin: Secondary | ICD-10-CM | POA: Diagnosis not present

## 2021-05-05 ENCOUNTER — Other Ambulatory Visit: Payer: Self-pay | Admitting: Internal Medicine

## 2021-05-19 ENCOUNTER — Telehealth: Payer: Self-pay | Admitting: Pharmacist

## 2021-05-19 NOTE — Chronic Care Management (AMB) (Signed)
Chronic Care Management Pharmacy Assistant   Name: [name]  MRN: [id number] DOB: 01-28-1946   Reason for Encounter: Disease State Diabetes Assessment Call   Conditions to be addressed/monitored: DMII  Recent office visits:  04/20/21 [name] - Patient presented for queasiness and other concerns. No medication changes.  Recent consult visits:  None   Hospital visits:  None in previous 6 months  Medications: Outpatient Encounter Medications as of 05/19/2021  Medication Sig   Ascorbic Acid (VITAMIN C) 1000 MG tablet Take 1,000 mg by mouth at bedtime.    atorvastatin (LIPITOR) 40 MG tablet TAKE 1 TABLET BY MOUTH EVERY DAY   Calcium Carbonate-Vitamin D (CALCIUM 600+D PO) Take 1 tablet by mouth daily.    diltiazem (CARDIZEM LA) 360 MG 24 hr tablet Take 1 tablet (360 mg total) by mouth daily.   dofetilide (TIKOSYN) 250 MCG capsule TAKE ONE CAPSULE BY MOUTH TWICE A DAY   DULoxetine (CYMBALTA) 60 MG capsule TAKE 1 CAPSULE BY MOUTH EVERY DAY   ELIQUIS 5 MG TABS tablet TAKE 1 TABLET BY MOUTH TWICE A DAY   empagliflozin (JARDIANCE) 25 MG TABS tablet Take 1 tablet (25 mg total) by mouth daily.   fluticasone (FLONASE) 50 MCG/ACT nasal spray Place 1 spray into both nostrils at bedtime.   loratadine (CLARITIN) 10 MG tablet TAKE 1 TABLET BY MOUTH EVERY DAY   Lysine 1000 MG TABS Take 1,000 mg by mouth at bedtime.    metFORMIN (GLUCOPHAGE) 500 MG tablet TAKE 1 TABLET (500 MG TOTAL) BY MOUTH 2 (TWO) TIMES DAILY WITH A MEAL. INCREASE TO 3 PER DAY AND THEN 4 PER DAY   metolazone (ZAROXOLYN) 2.5 MG tablet Take 1 tablet (2.5 mg total) by mouth every 4 days AS NEEDED for swelling. Please keep upcoming appointment in July 2022 for future refills. Thank you   metoprolol tartrate (LOPRESSOR) 50 MG tablet TAKE 1 TABLET BY MOUTH TWICE A DAY   Multiple Vitamin (MULTIVITAMIN WITH MINERALS) TABS tablet Take 1 tablet by mouth daily. Centrum   nitroGLYCERIN (NITROSTAT) 0.4 MG SL tablet Place 1  tablet (0.4 mg total) under the tongue every 5 (five) minutes x 3 doses as needed for chest pain. PLEASE CALL [phone] AND SCHEDULE AN OVERDUE APPT WITH DR Acie Fredrickson FOR FURTHER REFILLS TO BE GRANTED (2ND ATTEMPT)   nystatin-triamcinolone (MYCOLOG II) cream Apply 1 application topically 2 (two) times daily as needed.   omeprazole (PRILOSEC) 20 MG capsule Take 20 mg by mouth daily.   Polyethyl Glycol-Propyl Glycol (SYSTANE) 0.4-0.3 % SOLN Place 1 drop into both eyes at bedtime.   potassium chloride SA (KLOR-CON M20) 20 MEQ tablet Take 2 tablets (40 mEq total) by mouth 2 (two) times daily.   torsemide (DEMADEX) 20 MG tablet TAKE 2 TABLETS (40 MG TOTAL) BY MOUTH 2 (TWO) TIMES DAILY.   valACYclovir (VALTREX) 1000 MG tablet TAKE 2 TABS EVERY 12 HOURS ASD NEEDED   No facility-administered encounter medications on file as of 05/19/2021.  Recent Relevant Labs: Lab Results  Component Value Date/Time   HGBA1C 8.2 (H) 04/20/2021 11:30 AM   HGBA1C 7.1 (A) 10/05/2020 10:27 AM   HGBA1C 8.1 (H) 05/19/2020 07:46 PM   MICROALBUR <0.7 01/23/2017 10:51 AM    Kidney Function Lab Results  Component Value Date/Time   CREATININE 1.40 (H) 04/20/2021 11:30 AM   CREATININE 1.25 (H) 08/13/2020 10:29 AM   CREATININE 1.52 (H) 06/07/2020 03:06 PM   CREATININE 0.96 10/06/2015 04:21 PM   GFR 36.88 (L)  04/20/2021 11:30 AM   GFRNONAA 45 (L) 08/13/2020 10:29 AM   GFRNONAA 33 (L) 06/07/2020 03:06 PM   GFRAA 40 (L) 07/28/2020 10:44 AM   GFRAA 39 (L) 06/07/2020 03:06 PM    Current antihyperglycemic regimen:  Diltiazem (Cardizem CD) 360mg , 1 capsule once daily  Metoprolol tartrate 50 mg 1 tablet twice daily Adherence Review: Is the patient currently on a STATIN medication? Yes Is the patient currently on ACE/ARB medication? No Does the patient have >5 day gap between last estimated fill dates? No   Care Gaps: CCM- Need Zoster Vaccines - Overdue Colonoscopy - Overdue Eye Exam - Overdue Urine Micro - Overdue   TDAP - Overdue Foot Exam - Overdue AWV - Done 5/22 BP - 114/62 (04/20/21) Lab Results  Component Value Date   HGBA1C 8.2 (H) 04/20/2021    Star Rating Drugs: Metformin (Glucophage) 500 mg - Last [facility]/20/2021 [facility] Empagliflozin (Jardiance) 25 mg - Last filled 05/10/2021 [facility] Atorvastatin (Lipitor) 40 mg - Last filled [facility]/11/2021 [facility]  Attempted to reach on 11/17 left msg Attempted to reach on 11/22 Attempted to reach on 11/29  [place] Pharmacist Assistant [phone]

## 2021-06-04 DIAGNOSIS — Z780 Asymptomatic menopausal state: Secondary | ICD-10-CM | POA: Diagnosis not present

## 2021-06-04 DIAGNOSIS — Z1231 Encounter for screening mammogram for malignant neoplasm of breast: Secondary | ICD-10-CM | POA: Diagnosis not present

## 2021-06-04 LAB — HM DEXA SCAN: HM Dexa Scan: NORMAL

## 2021-06-04 LAB — HM MAMMOGRAPHY

## 2021-06-09 ENCOUNTER — Encounter: Payer: Self-pay | Admitting: Internal Medicine

## 2021-06-17 DIAGNOSIS — Z1211 Encounter for screening for malignant neoplasm of colon: Secondary | ICD-10-CM | POA: Diagnosis not present

## 2021-06-25 LAB — COLOGUARD: Cologuard: NEGATIVE

## 2021-07-05 ENCOUNTER — Other Ambulatory Visit: Payer: Self-pay

## 2021-07-05 ENCOUNTER — Encounter: Payer: Self-pay | Admitting: Internal Medicine

## 2021-07-05 DIAGNOSIS — Z961 Presence of intraocular lens: Secondary | ICD-10-CM | POA: Diagnosis not present

## 2021-07-05 DIAGNOSIS — H04123 Dry eye syndrome of bilateral lacrimal glands: Secondary | ICD-10-CM | POA: Diagnosis not present

## 2021-07-05 DIAGNOSIS — H43813 Vitreous degeneration, bilateral: Secondary | ICD-10-CM | POA: Diagnosis not present

## 2021-07-05 DIAGNOSIS — H02831 Dermatochalasis of right upper eyelid: Secondary | ICD-10-CM | POA: Diagnosis not present

## 2021-07-05 DIAGNOSIS — H40053 Ocular hypertension, bilateral: Secondary | ICD-10-CM | POA: Diagnosis not present

## 2021-07-05 DIAGNOSIS — H26491 Other secondary cataract, right eye: Secondary | ICD-10-CM | POA: Diagnosis not present

## 2021-07-05 DIAGNOSIS — D492 Neoplasm of unspecified behavior of bone, soft tissue, and skin: Secondary | ICD-10-CM | POA: Diagnosis not present

## 2021-07-05 DIAGNOSIS — H02834 Dermatochalasis of left upper eyelid: Secondary | ICD-10-CM | POA: Diagnosis not present

## 2021-07-05 DIAGNOSIS — E119 Type 2 diabetes mellitus without complications: Secondary | ICD-10-CM | POA: Diagnosis not present

## 2021-07-05 DIAGNOSIS — H10413 Chronic giant papillary conjunctivitis, bilateral: Secondary | ICD-10-CM | POA: Diagnosis not present

## 2021-07-05 LAB — HM DIABETES EYE EXAM

## 2021-07-05 MED ORDER — DOFETILIDE 250 MCG PO CAPS: 250.0000 ug | ORAL_CAPSULE | Freq: Two times a day (BID) | ORAL | 0 refills | Status: DC

## 2021-07-05 MED ORDER — APIXABAN 5 MG PO TABS: 5.0000 mg | ORAL_TABLET | Freq: Two times a day (BID) | ORAL | 1 refills | Status: DC

## 2021-07-05 MED ORDER — NITROGLYCERIN 0.4 MG SL SUBL: 0.4000 mg | SUBLINGUAL_TABLET | SUBLINGUAL | 0 refills | Status: DC | PRN

## 2021-07-05 MED ORDER — ATORVASTATIN CALCIUM 40 MG PO TABS: 40.0000 mg | ORAL_TABLET | Freq: Every day | ORAL | 1 refills | Status: DC

## 2021-07-05 MED ORDER — TORSEMIDE 20 MG PO TABS: 40.0000 mg | ORAL_TABLET | Freq: Two times a day (BID) | ORAL | 0 refills | Status: DC

## 2021-07-05 MED ORDER — POTASSIUM CHLORIDE CRYS ER 20 MEQ PO TBCR: 40.0000 meq | EXTENDED_RELEASE_TABLET | Freq: Two times a day (BID) | ORAL | 0 refills | Status: DC

## 2021-07-05 MED ORDER — METOLAZONE 2.5 MG PO TABS: ORAL_TABLET | ORAL | 0 refills | Status: DC

## 2021-07-05 MED ORDER — DILTIAZEM HCL ER COATED BEADS 360 MG PO TB24: 360.0000 mg | ORAL_TABLET | Freq: Every day | ORAL | 1 refills | Status: DC

## 2021-07-05 NOTE — Telephone Encounter (Signed)
Pt last saw Dr Curt Bears 01/18/21, last labs 04/20/21 Creat 1.40, age 76, weight 114.1kg, based on specified criteria pt is on appropriate dosage of Eliquis 5mg  BID for afib.  Will refill rx.

## 2021-07-07 ENCOUNTER — Telehealth: Payer: Self-pay

## 2021-07-07 ENCOUNTER — Other Ambulatory Visit: Payer: Self-pay

## 2021-07-07 MED ORDER — APIXABAN 5 MG PO TABS: 5.0000 mg | ORAL_TABLET | Freq: Two times a day (BID) | ORAL | 1 refills | Status: DC

## 2021-07-07 MED ORDER — ATORVASTATIN CALCIUM 40 MG PO TABS: 40.0000 mg | ORAL_TABLET | Freq: Every day | ORAL | 1 refills | Status: DC

## 2021-07-07 MED ORDER — DILTIAZEM HCL ER COATED BEADS 360 MG PO TB24: 360.0000 mg | ORAL_TABLET | Freq: Every day | ORAL | 1 refills | Status: DC

## 2021-07-07 NOTE — Telephone Encounter (Signed)
[facility] mail order pharmacy requesting clarification. Pharmacy is stating that the concurrent use of torsemide and dofetilide may cause arrhythmias. Is Dr. Curt Bears aware of this and would like to prescribe these medications together?  Order # [id number]  Ph# [phone] Please address

## 2021-07-07 NOTE — Telephone Encounter (Signed)
Spoke to [facility] and informed we are aware and pt has been on both medications for some time now with close monitoring.

## 2021-07-07 NOTE — Telephone Encounter (Signed)
Eliquis 5mg  refill request received. Patient is 76 years old, weight-114.1kg, Crea-1.40 on 04/20/2021, Diagnosis-Afib, and last seen by Dr. Curt Bears on 01/18/2021. Dose is appropriate based on dosing criteria. Will send in refill to requested pharmacy.

## 2021-07-08 ENCOUNTER — Ambulatory Visit: Payer: Medicare Other | Admitting: Cardiology

## 2021-07-08 ENCOUNTER — Other Ambulatory Visit: Payer: Self-pay

## 2021-07-08 ENCOUNTER — Encounter: Payer: Self-pay | Admitting: Cardiology

## 2021-07-08 VITALS — BP 114/72 | HR 63 | Ht 66.5 in | Wt 251.8 lb

## 2021-07-08 DIAGNOSIS — R0602 Shortness of breath: Secondary | ICD-10-CM | POA: Diagnosis not present

## 2021-07-08 DIAGNOSIS — I495 Sick sinus syndrome: Secondary | ICD-10-CM | POA: Diagnosis not present

## 2021-07-08 NOTE — Progress Notes (Signed)
Electrophysiology Office Note   Date:  07/08/2021   ID:  JANET HUMPHREYS, DOB [birth year], MRN [id number]  PCP:  [name], MD  Primary Electrophysiologist: Gaye Alken, MD    No chief complaint on file.     History of Present Illness: [name] is a 76 y.o. female who presents today for electrophysiology evaluation.     She has a history significant for coronary artery disease status post RCA stent, diastolic heart failure, atrial fibrillation, tachybradycardia syndrome status post [facility] Jude dual-chamber pacemaker, CKD, hypertension, hyperlipidemia, OSA on CPAP.  She is status post AF ablation x3.  She is currently on dofetilide.  She presented with atrial flutter and is now status post mitral annular flutter ablation.  Today, denies symptoms of palpitations, chest pain, orthopnea, PND, lower extremity edema, claudication, dizziness, presyncope, syncope, bleeding, or neurologic sequela. The patient is tolerating medications without difficulties.  She is short of breath today.  She went into atrial flutter earlier this morning.  She is short of breath doing much daily activity at all.  This is worse than her normal baseline shortness of breath.  She has not had episodes of atrial flutters since October.  Past Medical History:  Diagnosis Date   Anticoagulant long-term use    pradaxa   Anxiety    Arthritis    "fingers, lower back" (04/23/2017    CAD (coronary artery disease) 1194,1740   post PTCA with bare-metal stenting to mid RCA in December 2004      CHF (congestive heart failure) (HCC)    Chronic atrial fibrillation ([place]) 06/2007   Tachybradycardia pacemaker   Chronic kidney disease    10% function - ?R, other kidney is compensating     CVA (cerebral vascular accident) [facility]) [phone]   denies residual on 04/23/2017   Depression    Diplopia 06/19/2008   Qualifier: Diagnosis of  By: [name] MD, [name]    Dysrhythmia    ATRIAL FIBRILATION   Edema of  lower extremity    Hyperlipidemia    Hypertension    Inferior myocardial infarction [facility])    acute inferior wall mi/other medical hx   Myocardial infarction [facility]) [phone]   Obesity    OSA on CPAP    last test- 2010   Pacemaker    Pneumonia 2014   tx. ----  [facility]   Pulmonary hypertension ([place])    moderate pulmonary hypertension by 10/2016 echo and 10/2013 cardiac cath   Shortness of breath    Skin cancer    "cut off right [place]; burned off LLE" (04/23/2017)   Sleep apnea    Spondylolisthesis    TIA (transient ischemic attack) 2008   Unspecified hemorrhoids without mention of complication 6/37/8588   Colonoscopy--Dr. Carlean Purl    Past Surgical History:  Procedure Laterality Date   ABDOMINAL HYSTERECTOMY     APPENDECTOMY  1984   ATRIAL FIBRILLATION ABLATION N/A 09/29/2016   Procedure: Atrial Fibrillation Ablation;  Surgeon: Zakeya Junker Meredith Leeds, MD;  Location: [place] CV LAB;  Service: Cardiovascular;  Laterality: N/A;   ATRIAL FIBRILLATION ABLATION N/A 02/07/2018   Procedure: ATRIAL FIBRILLATION ABLATION;  Surgeon: Constance Haw, MD;  Location: [place] CV LAB;  Service: Cardiovascular;  Laterality: N/A;   ATRIAL FIBRILLATION ABLATION N/A 03/13/2019   Procedure: ATRIAL FIBRILLATION ABLATION;  Surgeon: Constance Haw, MD;  Location: [place] CV LAB;  Service: Cardiovascular;  Laterality: N/A;   ATRIAL FIBRILLATION ABLATION N/A 07/07/2020   Procedure:  ATRIAL FIBRILLATION ABLATION;  Surgeon: Constance Haw, MD;  Location: [place] CV LAB;  Service: Cardiovascular;  Laterality: N/A;   BACK SURGERY     CARDIOVERSION N/A 09/12/2017   Procedure: CARDIOVERSION;  Surgeon: Jerline Pain, MD;  Location: [facility] ENDOSCOPY;  Service: Cardiovascular;  Laterality: N/A;   CARDIOVERSION N/A 12/13/2017   Procedure: CARDIOVERSION;  Surgeon: Sanda Klein, MD;  Location: [place] ENDOSCOPY;  Service: Cardiovascular;  Laterality: N/A;   CARDIOVERSION N/A 02/18/2018    Procedure: CARDIOVERSION;  Surgeon: Dorothy Spark, MD;  Location: [facility] END[facility]COPY;  Service: Cardiovascular;  Laterality: N/A;   CARDIOVERSION N/A 05/20/2018   Procedure: CARDIOVERSION;  Surgeon: [name], MD;  Location: [facility] ENDOSCOPY;  Service: Cardiovascular;  Laterality: N/A;   CARDIOVERSION N/A 04/16/2019   Procedure: CARDIOVERSION;  Surgeon: Donato Heinz, MD;  Location: [place];  Service: Endoscopy;  Laterality: N/A;   CARDIOVERSION N/A 01/22/2020   Procedure: CARDIOVERSION;  Surgeon: Geralynn Rile, MD;  Location: [place];  Service: Cardiovascular;  Laterality: N/A;   CARDIOVERSION N/A 02/18/2020   Procedure: CARDIOVERSION;  Surgeon: Elouise Munroe, MD;  Location: [facility] ENDOSCOPY;  Service: Cardiovascular;  Laterality: N/A;   CATARACT EXTRACTION W/ INTRAOCULAR LENS  IMPLANT, BILATERAL Bilateral 01/15/2017- 03/2017   CHOLECYSTECTOMY     CORONARY ANGIOPLASTY  X 2   CORONARY ANGIOPLASTY WITH [place]; ~ 2007; ?date   "1 stent; replaced stent; not sure when I got the last stent" (04/23/2017)   DOPPLER ECHOCARDIOGRAPHY  2009   ELECTROPHYSIOLOGIC STUDY N/A 03/31/2016   Procedure: Cardioversion;  Surgeon: Evans Lance, MD;  Location: [place] CV LAB;  Service: Cardiovascular;  Laterality: N/A;   ELECTROPHYSIOLOGIC STUDY N/A 08/04/2016   Procedure: Cardioversion;  Surgeon: Evans Lance, MD;  Location: [place] CV LAB;  Service: Cardiovascular;  Laterality: N/A;   INSERT / REPLACE / REMOVE PACEMAKER  06/2007   IR RADIOLOGY PERIPHERAL GUIDED IV START  01/31/2018   IR US GUIDE VASC ACCESS LEFT  01/31/2018   JOINT REPLACEMENT     LAPAROSCOPIC CHOLECYSTECTOMY  1994   LEFT AND RIGHT HEART CATHETERIZATION WITH CORONARY ANGIOGRAM N/A 10/06/2013   Procedure: LEFT AND RIGHT HEART CATHETERIZATION WITH CORONARY ANGIOGRAM;  Surgeon: Troy Sine, MD;  Location: [facility] CATH LAB;  Service: Cardiovascular;  Laterality: N/A;   LEFT OOPHORECTOMY Left ~ [place]  2000s - 04/2015 X 3   L3-4; L4-5; L2-3; Dr Trenton Gammon   RIGHT/LEFT HEART CATH AND CORONARY ANGIOGRAPHY N/A 05/09/2017   Procedure: RIGHT/LEFT HEART CATH AND CORONARY ANGIOGRAPHY;  Surgeon: Sherren Mocha, MD;  Location: [place] CV LAB;  Service: Cardiovascular;  Laterality: N/A;   SKIN CANCER EXCISION Right    [place]   TEE WITHOUT CARDIOVERSION N/A 09/29/2016   Procedure: TRANSESOPHAGEAL ECHOCARDIOGRAM (TEE);  Surgeon: Jerline Pain, MD;  Location: [facility] ENDOSCOPY;  Service: Cardiovascular;  Laterality: N/A;   TOTAL ABDOMINAL HYSTERECTOMY  1984   "uterus & right ovary"   TOTAL KNEE ARTHROPLASTY Left 04/23/2017   TOTAL KNEE ARTHROPLASTY Left 04/23/2017   Procedure: TOTAL KNEE ARTHROPLASTY;  Surgeon: Frederik Pear, MD;  Location: [place];  Service: Orthopedics;  Laterality: Left;   ULTRASOUND GUIDANCE FOR VASCULAR ACCESS  05/09/2017   Procedure: Ultrasound Guidance For Vascular Access;  Surgeon: Sherren Mocha, MD;  Location: [place] CV LAB;  Service: Cardiovascular;;     Current Outpatient Medications  Medication Sig Dispense Refill   apixaban (ELIQUIS) 5 MG TABS tablet Take 1  tablet (5 mg total) by mouth 2 (two) times daily. 180 tablet 1   Ascorbic Acid (VITAMIN C) 1000 MG tablet Take 1,000 mg by mouth at bedtime.      atorvastatin (LIPITOR) 40 MG tablet Take 1 tablet (40 mg total) by mouth daily. 90 tablet 1   Calcium Carbonate-Vitamin D (CALCIUM 600+D PO) Take 1 tablet by mouth daily.      diltiazem (CARDIZEM LA) 360 MG 24 hr tablet Take 1 tablet (360 mg total) by mouth daily. 90 tablet 1   dofetilide (TIKOSYN) 250 MCG capsule Take 1 capsule (250 mcg total) by mouth 2 (two) times daily. 180 capsule 0   DULoxetine (CYMBALTA) 60 MG capsule TAKE 1 CAPSULE BY MOUTH EVERY DAY 90 capsule 0   empagliflozin (JARDIANCE) 25 MG TABS tablet Take 1 tablet (25 mg total) by mouth daily. 30 tablet 3   fluticasone (FLONASE) 50 MCG/ACT nasal spray Place 1 spray into both nostrils at  bedtime.     loratadine (CLARITIN) 10 MG tablet TAKE 1 TABLET BY MOUTH EVERY DAY 90 tablet 0   Lysine 1000 MG TABS Take 1,000 mg by mouth at bedtime.      metFORMIN (GLUCOPHAGE) 500 MG tablet TAKE 1 TABLET (500 MG TOTAL) BY MOUTH 2 (TWO) TIMES DAILY WITH A MEAL. INCREASE TO 3 PER DAY AND THEN 4 PER DAY 360 tablet 0   metolazone (ZAROXOLYN) 2.5 MG tablet Take 1 tablet (2.5 mg total) by mouth every 4 days AS NEEDED for swelling. Please keep upcoming appt in Jan, 2023 for further refills 30 tablet 0   metoprolol tartrate (LOPRESSOR) 50 MG tablet TAKE 1 TABLET BY MOUTH TWICE A DAY 180 tablet 0   Multiple Vitamin (MULTIVITAMIN WITH MINERALS) TABS tablet Take 1 tablet by mouth daily. Centrum     nitroGLYCERIN (NITROSTAT) 0.4 MG SL tablet Place 1 tablet (0.4 mg total) under the tongue every 5 (five) minutes x 3 doses as needed for chest pain. PLEASE CALL [phone] AND SCHEDULE AN OVERDUE APPT WITH DR Acie Fredrickson FOR FURTHER REFILLS TO BE GRANTED (2ND ATTEMPT) 15 tablet 0   nystatin-triamcinolone (MYCOLOG II) cream Apply 1 application topically 2 (two) times daily as needed. 30 g 1   omeprazole (PRILOSEC) 20 MG capsule Take 20 mg by mouth daily.     Polyethyl Glycol-Propyl Glycol (SYSTANE) 0.4-0.3 % SOLN Place 1 drop into both eyes at bedtime.     potassium chloride SA (KLOR-CON M20) 20 MEQ tablet Take 2 tablets (40 mEq total) by mouth 2 (two) times daily. Please keep upcoming appt in Jan, 2023 for further refills 120 tablet 0   torsemide (DEMADEX) 20 MG tablet Take 2 tablets (40 mg total) by mouth 2 (two) times daily. 360 tablet 0   valACYclovir (VALTREX) 1000 MG tablet TAKE 2 TABS EVERY 12 HOURS ASD NEEDED 30 tablet 1   No current facility-administered medications for this visit.    Allergies:   Hydrocodone-guaifenesin, Adhesive [tape], and Pedi-pre tape spray [wound dressing adhesive]   Social History:  The patient  reports that she quit smoking about 39 years ago. Her smoking use included cigarettes.  She started smoking about 43 years ago. She has a 5.00 pack-year smoking history. She has never used smokeless tobacco. She reports current alcohol use. She reports that she does not use drugs.   Family History:  The patient's family history includes Arrhythmia in her mother; Colon cancer in her maternal aunt; Dementia in her mother; Diabetes in her mother and paternal grandfather; Heart  attack in her brother; Heart disease in her paternal aunt; Hypertension in her mother; Prostate cancer in her maternal grandfather; Suicidality in her father.   ROS:  Please see the history of present illness.   Otherwise, review of systems is positive for none.   All other systems are reviewed and negative.   PHYSICAL EXAM: VS:  BP 114/72    Pulse 63    Ht 5' 6.5" (1.689 m)    Wt 251 lb 12.8 oz (114.2 kg)    SpO2 (!) 88%    BMI 40.03 kg/m  , BMI Body mass index is 40.03 kg/m. GEN: Well nourished, well developed, in no acute distress  HEENT: normal  Neck: no JVD, carotid bruits, or masses Cardiac: RRR; no murmurs, rubs, or gallops,no edema  Respiratory:  clear to auscultation bilaterally, normal work of breathing GI: soft, nontender, nondistended, + BS MS: no deformity or atrophy  Skin: warm and dry, device site well healed Neuro:  Strength and sensation are intact Psych: euthymic mood, full affect  EKG:  EKG is ordered today. Personal review of the ekg ordered shows atrial flutter  Personal review of the device interrogation today. Results in [place]: 08/04/2020: Magnesium 2.2 04/20/2021: ALT 69; BUN 31; Creatinine, Ser 1.40; Hemoglobin 14.8; Platelets 283.0; Potassium 3.6; Sodium 139; TSH 4.37    Lipid Panel     Component Value Date/Time   CHOL 143 04/20/2021 1130   CHOL 116 10/16/2016 0850   TRIG 226.0 (H) 04/20/2021 1130   HDL 40.30 04/20/2021 1130   HDL 42 10/16/2016 0850   CHOLHDL 4 04/20/2021 1130   VLDL 45.2 (H) 04/20/2021 1130   LDLCALC 68 04/10/2019 0801   LDLCALC 46  10/16/2016 0850   LDLDIRECT 81.0 04/20/2021 1130     Wt Readings from Last 3 Encounters:  07/08/21 251 lb 12.8 oz (114.2 kg)  04/20/21 251 lb 9.6 oz (114.1 kg)  01/18/21 255 lb (115.7 kg)      Other studies Reviewed: Additional studies/ records that were reviewed today include: TTE 03/28/16  Review of the above records today demonstrates:  - Left ventricle: The cavity size was normal. Wall thickness was   increased in a pattern of mild LVH. Systolic function was normal.   The estimated ejection fraction was in the range of 55% to 60%.   Indeterminant diastolic function (atrial fibrillation). Wall   motion was normal; there were no regional wall motion   abnormalities. - Aortic valve: There was no stenosis. - Mitral valve: Mildly calcified annulus. There was no significant   regurgitation. - Left atrium: The atrium was mildly dilated. - Right ventricle: The cavity size was normal. Pacer wire or   catheter noted in right ventricle. Systolic function was normal. - Tricuspid valve: Peak RV-RA gradient (S): 22 mm Hg. - Pulmonary arteries: PA peak pressure: 30 mm Hg (S). - Systemic veins: IVC measured 2.3 cm with normal respirophasic   variation, suggesting RA pressure 8 mmHg.  LHC/RHC 05/09/17 1.  Single-vessel coronary artery disease with continued patency of the stented segment in the right coronary artery and chronic occlusion of a small acute marginal branch collateralized by the left coronary artery 2.  Patent left main, LAD, and left circumflex without significant stenosis 3.  Vigorous LV systolic function with mildly elevated LVEDP 4.  Severe pulmonary hypertension with mean pulmonary artery pressure of 48 mmHg.  Because of high cardiac output (8.5 L/min), the patient's pulmonary vascular resistance is only mildly increased at  3 Wood units.  ASSESSMENT AND PLAN:  1.  Persistent atrial fibrillation/flutter: Status post ablation x3.  Currently on Eliquis 5 mg twice daily with a  CHA2DS2-VASc of 3.  Has been started on dofetilide 250 mcg twice daily.  High risk medication monitoring via ECG.  Unfortunately she is in atrial flutter today.  An attempt was made to pace her out of atrial flutter back to sinus rhythm but this was unsuccessful.  We Rosella Crandell have her come back in 1 week for a device check to see if she is in atrial fibrillation or flutter at that time.  If so, we Gae Bihl likely plan for cardioversion.  2.  Hypertension: Currently well controlled  3.  Obesity: Diet and exercise encouraged  4.  Chronic diastolic heart failure: Short of breath today.  Unclear of her volume status.  We Fiore Detjen check a BNP.  5.  Obstructive sleep apnea: CPAP compliance encouraged  6.  Sick sinus syndrome: Status post [employer] dual-chamber pacemaker.  Device functioning appropriately.  No changes at this time.   Current medicines are reviewed at length with the patient today.   The patient does not have concerns regarding her medicines.  The following changes were made today: None  Labs/ tests ordered today include:  Orders Placed This Encounter  Procedures   Pro b natriuretic peptide (BNP)   EKG 12-Lead      Disposition:   FU with Dathan Attia 3 months  Signed, Zaydyn Havey Meredith Leeds, MD  07/08/2021 12:19 PM     [place] [address] [place] [id number] [phone] (office) [phone] (fax)

## 2021-07-08 NOTE — Patient Instructions (Signed)
Medication Instructions:  Your physician recommends that you continue on your current medications as directed. Please refer to the Current Medication list given to you today.  *If you need a refill on your cardiac medications before your next appointment, please call your pharmacy*   Lab Work: BNP today If you have labs (blood work) drawn today and your tests are completely normal, you will receive your results only by: [place] (if you have MyChart) OR A paper copy in the mail If you have any lab test that is abnormal or we need to change your treatment, we will call you to review the results.   Testing/Procedures: None ordered   Follow-Up: At [facility], you and your health needs are our priority.  As part of our continuing mission to provide you with exceptional heart care, we have created designated Provider Care Teams.  These Care Teams include your primary Cardiologist (physician) and Advanced Practice Providers (APPs -  Physician Assistants and Nurse Practitioners) who all work together to provide you with the care you need, when you need it.    Your next appointment:   3 month(s)  The format for your next appointment:   In Person  Provider:   You will see one of the following Advanced Practice Providers on your designated Care Team:   Tommye Standard, [place] Legrand Como "Jonni Sanger" Chalmers Cater, [place]    Thank you for choosing [facility]!!   Trinidad Curet, RN [phone]    Other Instructions

## 2021-07-09 LAB — PRO B NATRIURETIC PEPTIDE: NT-Pro BNP: 523 pg/mL (ref 0–738)

## 2021-07-13 ENCOUNTER — Other Ambulatory Visit: Payer: Self-pay

## 2021-07-13 MED ORDER — DULOXETINE HCL 60 MG PO CPEP: ORAL_CAPSULE | ORAL | 0 refills | Status: DC

## 2021-07-13 MED ORDER — METFORMIN HCL 500 MG PO TABS: ORAL_TABLET | ORAL | 0 refills | Status: DC

## 2021-07-13 MED ORDER — METOPROLOL TARTRATE 50 MG PO TABS: 50.0000 mg | ORAL_TABLET | Freq: Two times a day (BID) | ORAL | 0 refills | Status: DC

## 2021-07-13 MED ORDER — VALACYCLOVIR HCL 1 G PO TABS: ORAL_TABLET | ORAL | 1 refills | Status: DC

## 2021-07-15 ENCOUNTER — Encounter: Payer: Self-pay | Admitting: Internal Medicine

## 2021-07-16 ENCOUNTER — Other Ambulatory Visit: Payer: Self-pay | Admitting: Cardiology

## 2021-07-19 ENCOUNTER — Other Ambulatory Visit: Payer: Self-pay | Admitting: Internal Medicine

## 2021-07-24 IMAGING — CT CT HEAD W/O CM
4 series · 16 of 47 positions shown, 18 images · non-contrast
Comparison: 05/26/2020, 04/21/2010

CLINICAL DATA: Dizziness, hyperglycemia

EXAM:
CT HEAD WITHOUT CONTRAST
TECHNIQUE: Contiguous axial images were obtained from the base of the skull
through the vertex without intravenous contrast.

[Series 3: head without · axial · non-contrast · 0.49mm/px · z∈[-95,+25]mm · 7 of 34 slices shown, 9 images]
[im 5/34  brain]
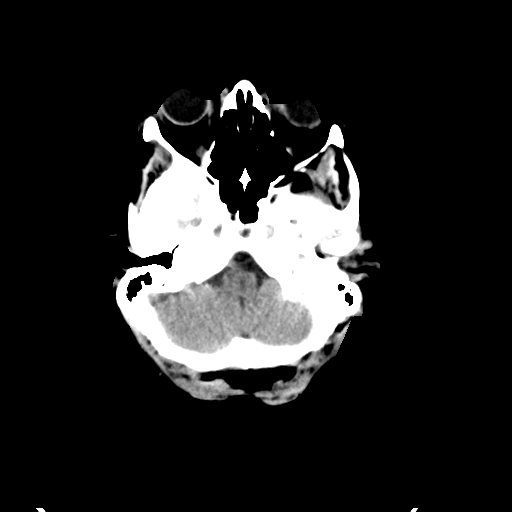
[im 5/34  bone]
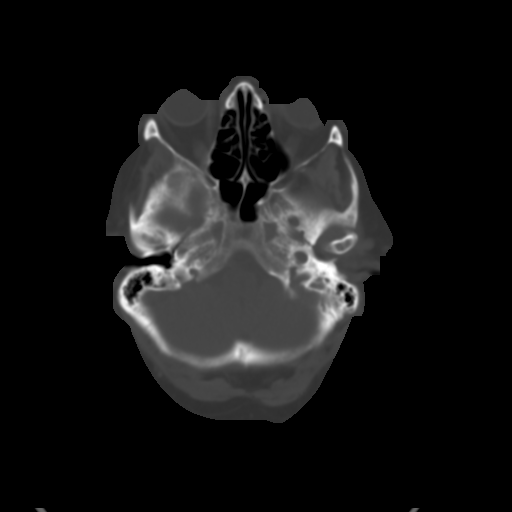
[im 9/34  brain]
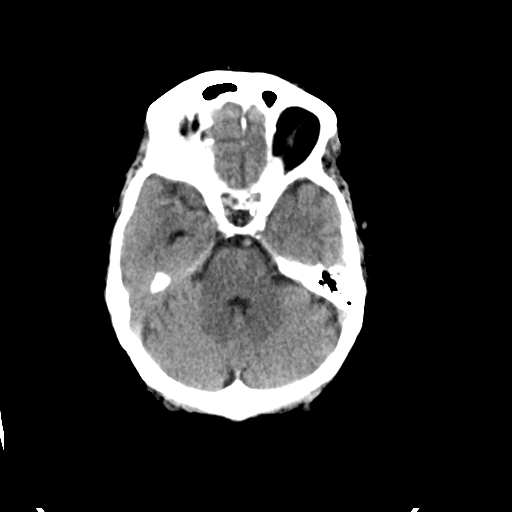
[im 13/34  brain]
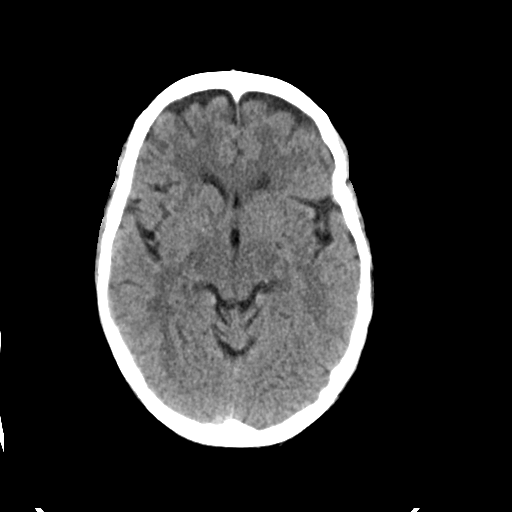
[im 17/34  brain]
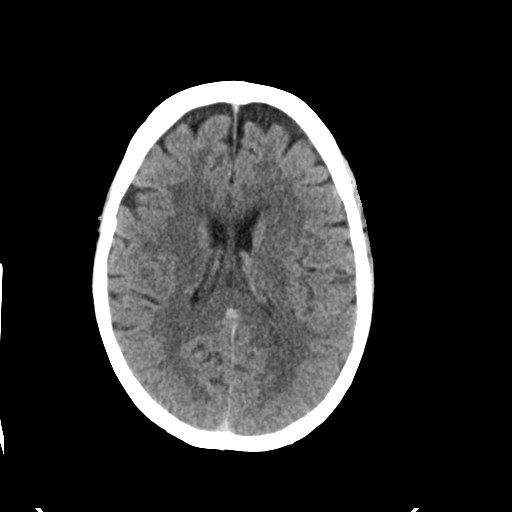
[im 21/34  brain]
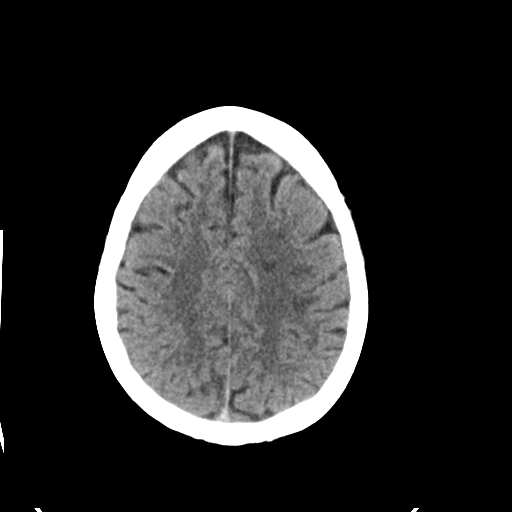
[im 21/34  bone]
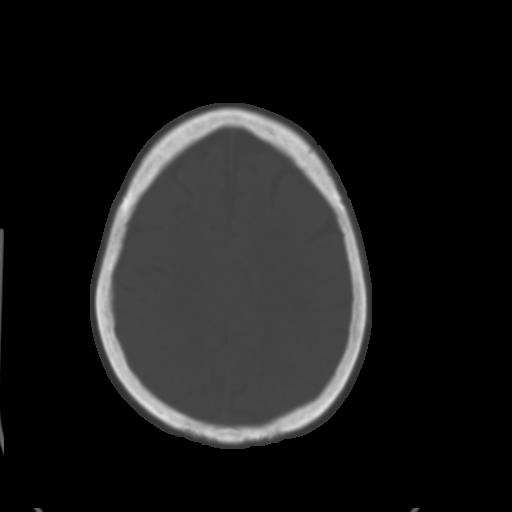
[im 25/34  brain]
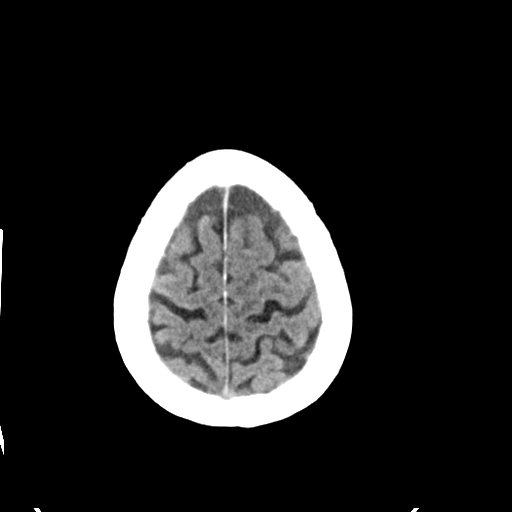
[im 29/34  brain]
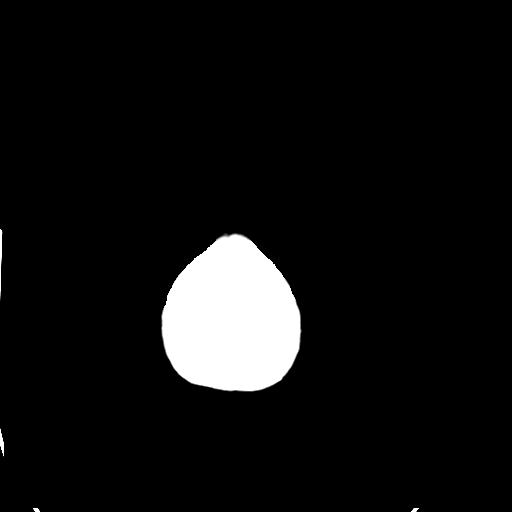

[Series 4: head bone · axial · 0.49mm/px · z∈[-99,-67]mm · 3 of 84 slices shown]
[im 9/84  bone]
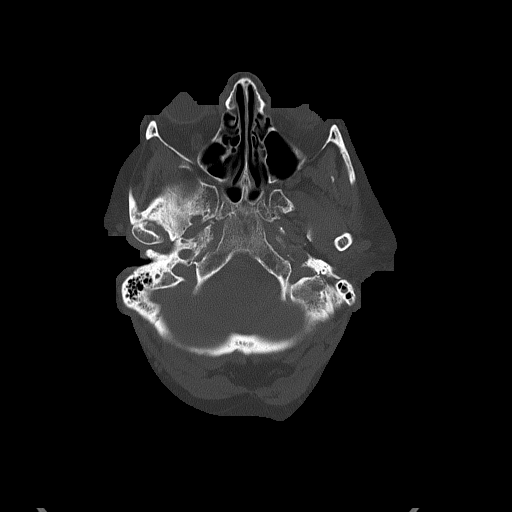
[im 17/84  bone]
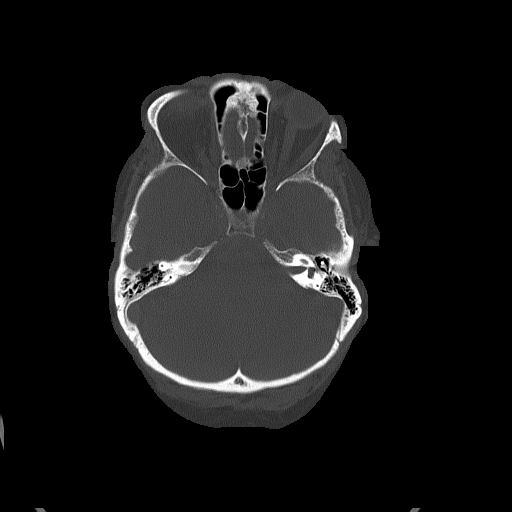
[im 25/84  bone]
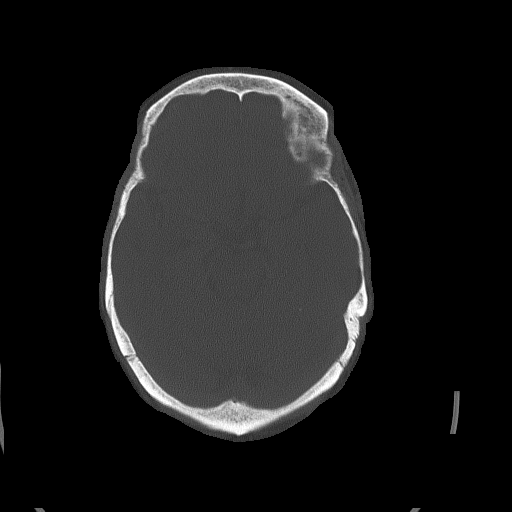

[Series 5: head without cor · coronal · non-contrast · 0.31mm/px · 3 of 67 slices shown]
[im 23/67  brain]
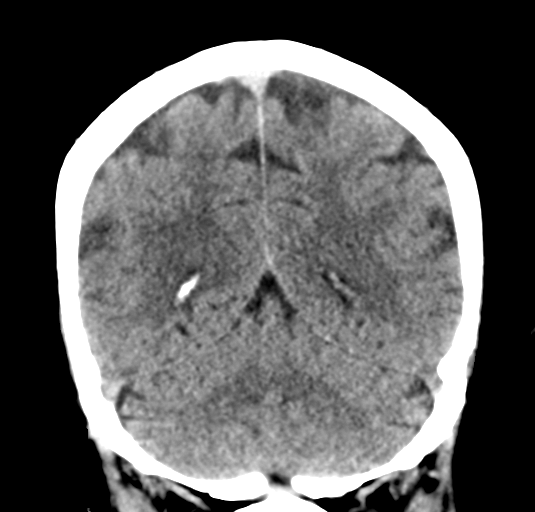
[im 30/67  brain]
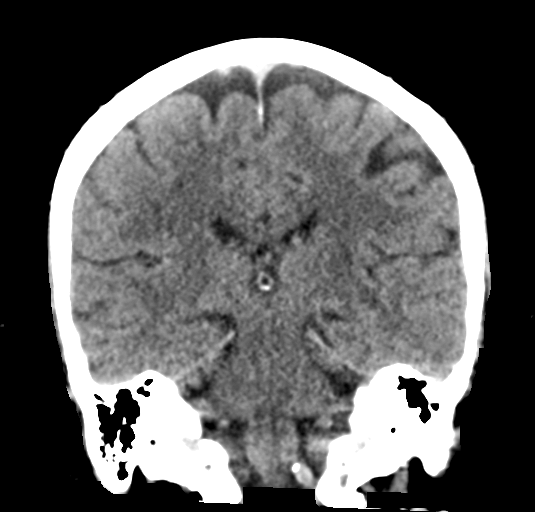
[im 37/67  brain]
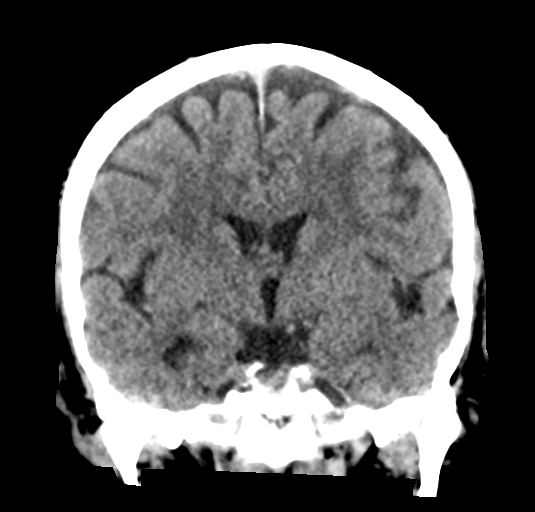

[Series 6: head without sag · sagittal · non-contrast · 0.30mm/px · 3 of 67 slices shown]
[im 23/67  brain]
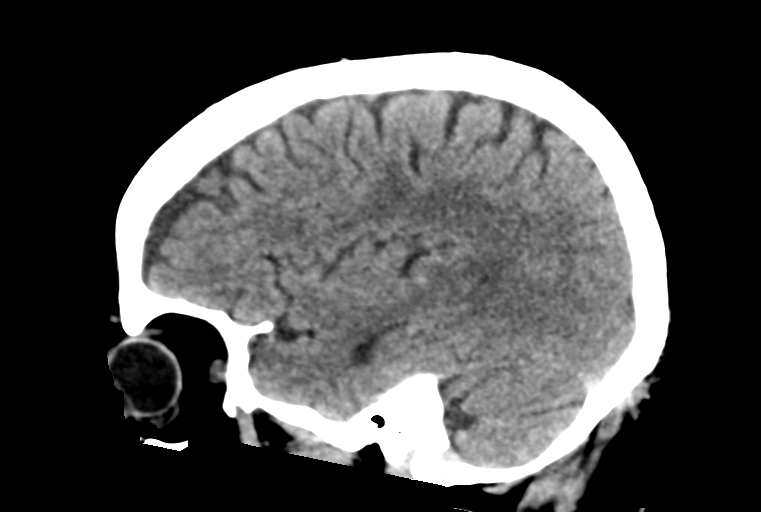
[im 34/67  brain]
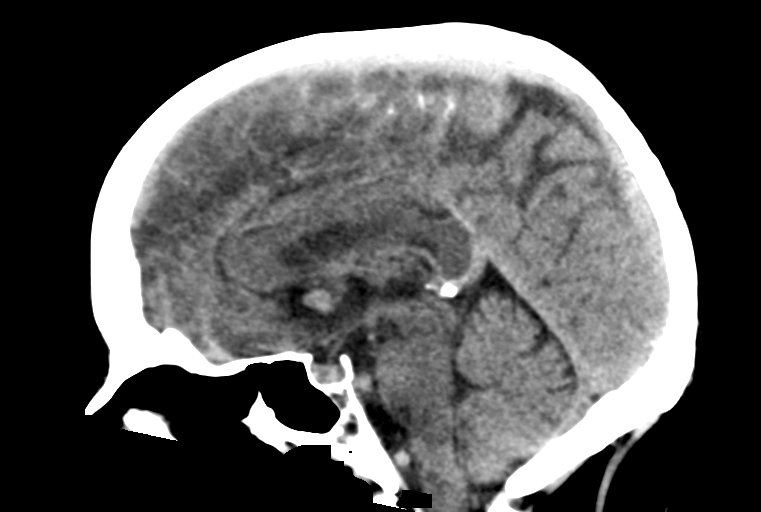
[im 45/67  brain]
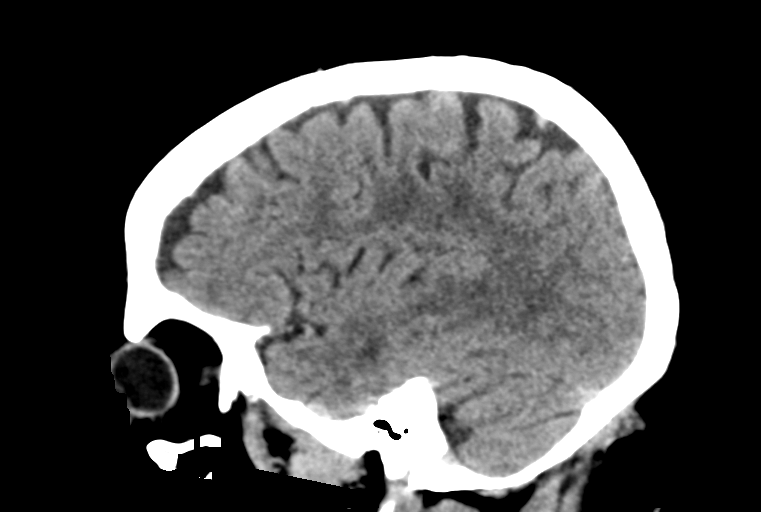

[16 of 47 positions shown; findings below may reference images not displayed]

FINDINGS: Brain: No evidence of acute infarction, hemorrhage, hydrocephalus,
extra-axial collection or mass lesion/mass effect. Scattered
low-density changes within the periventricular and subcortical white
matter compatible with chronic microvascular ischemic change. Mild
diffuse cerebral volume loss.

Vascular: Redemonstrated anterior communicating artery aneurysm
measuring approximately 8 x 5 x 6 mm (series 6, image 34), which
appears minimally increased in size in the AP dimension compared to
prior CT angiogram of 2311. Atherosclerotic calcifications involving
the large vessels of the skull base.

Skull: Normal. Negative for fracture or focal lesion.

Sinuses/Orbits: No acute finding.

Other: None.
IMPRESSION: 1. No acute intracranial findings.
2. Chronic microvascular ischemic change and cerebral volume loss.
3. Redemonstrated anterior communicating artery aneurysm measuring
up to 8 mm, which appears minimally increased in size compared to
prior CT angiogram of 2311.

## 2021-08-16 ENCOUNTER — Telehealth: Payer: Self-pay | Admitting: Pharmacist

## 2021-08-16 NOTE — Chronic Care Management (AMB) (Signed)
Chronic Care Management Pharmacy Assistant   Name: TAIZ BICKLE  MRN: [id number] DOB: Mar 07, 1946  Reason for Encounter: Medication Review/ Diabetes Assessment   Conditions to be addressed/monitored: DMII  Recent office visits:  None  Recent consult visits:  07/08/21 Constance Haw, MD (Cardiology) - Patient presented for Shortness of breath and other concerns.No medication changes.  06/04/21 Lorelle Gibbs (Radiology) - Patient presented for Mammogram and breast screenings. No other visit details available.  Hospital visits:  None in previous 6 months  Medications: Outpatient Encounter Medications as of 08/16/2021  Medication Sig   apixaban (ELIQUIS) 5 MG TABS tablet Take 1 tablet (5 mg total) by mouth 2 (two) times daily.   Ascorbic Acid (VITAMIN C) 1000 MG tablet Take 1,000 mg by mouth at bedtime.    atorvastatin (LIPITOR) 40 MG tablet Take 1 tablet (40 mg total) by mouth daily.   Calcium Carbonate-Vitamin D (CALCIUM 600+D PO) Take 1 tablet by mouth daily.    diltiazem (CARDIZEM LA) 360 MG 24 hr tablet Take 1 tablet (360 mg total) by mouth daily.   dofetilide (TIKOSYN) 250 MCG capsule Take 1 capsule (250 mcg total) by mouth 2 (two) times daily.   DULoxetine (CYMBALTA) 60 MG capsule TAKE 1 CAPSULE BY MOUTH EVERY DAY   fluticasone (FLONASE) 50 MCG/ACT nasal spray Place 1 spray into both nostrils at bedtime.   JARDIANCE 25 MG TABS tablet TAKE 1 TABLET BY MOUTH DAILY   loratadine (CLARITIN) 10 MG tablet TAKE 1 TABLET BY MOUTH EVERY DAY   Lysine 1000 MG TABS Take 1,000 mg by mouth at bedtime.    metFORMIN (GLUCOPHAGE) 500 MG tablet TAKE 1 TABLET (500 MG TOTAL) BY MOUTH 2 (TWO) TIMES DAILY WITH A MEAL. INCREASE TO 3 PER DAY AND THEN 4 PER DAY   metolazone (ZAROXOLYN) 2.5 MG tablet Take 1 tablet (2.5 mg total) by mouth every 4 days AS NEEDED for swelling. Please keep upcoming appt in Jan, 2023 for further refills   metoprolol tartrate (LOPRESSOR) 50 MG tablet Take 1  tablet (50 mg total) by mouth 2 (two) times daily.   Multiple Vitamin (MULTIVITAMIN WITH MINERALS) TABS tablet Take 1 tablet by mouth daily. Centrum   nitroGLYCERIN (NITROSTAT) 0.4 MG SL tablet Place 1 tablet (0.4 mg total) under the tongue every 5 (five) minutes x 3 doses as needed for chest pain. PLEASE CALL [phone] AND SCHEDULE AN OVERDUE APPT WITH DR Acie Fredrickson FOR FURTHER REFILLS TO BE GRANTED (2ND ATTEMPT)   nystatin-triamcinolone (MYCOLOG II) cream Apply 1 application topically 2 (two) times daily as needed.   omeprazole (PRILOSEC) 20 MG capsule Take 20 mg by mouth daily.   Polyethyl Glycol-Propyl Glycol (SYSTANE) 0.4-0.3 % SOLN Place 1 drop into both eyes at bedtime.   potassium chloride SA (KLOR-CON M) 20 MEQ tablet TAKE 2 TABLETS BY MOUTH TWICE  DAILY   torsemide (DEMADEX) 20 MG tablet Take 2 tablets (40 mg total) by mouth 2 (two) times daily.   valACYclovir (VALTREX) 1000 MG tablet TAKE 2 TABS EVERY 12 HOURS ASD NEEDED   No facility-administered encounter medications on file as of 08/16/2021.  Recent Relevant Labs: Lab Results  Component Value Date/Time   HGBA1C 8.2 (H) 04/20/2021 11:30 AM   HGBA1C 7.1 (A) 10/05/2020 10:27 AM   HGBA1C 8.1 (H) 05/19/2020 07:46 PM   MICROALBUR <0.7 01/23/2017 10:51 AM    Kidney Function Lab Results  Component Value Date/Time   CREATININE 1.40 (H) 04/20/2021 11:30 AM   CREATININE 1.25 (  H) 08/13/2020 10:29 AM   CREATININE 1.52 (H) 06/07/2020 03:06 PM   CREATININE 0.96 10/06/2015 04:21 PM   GFR 36.88 (L) 04/20/2021 11:30 AM   GFRNONAA 45 (L) 08/13/2020 10:29 AM   GFRNONAA 33 (L) 06/07/2020 03:06 PM   GFRAA 40 (L) 07/28/2020 10:44 AM   GFRAA 39 (L) 06/07/2020 03:06 PM    Current antihyperglycemic regimen:  Metformin 500 mg 2 tablets twice daily Jardiance 25 mg 1 tablet daily What recent interventions/DTPs have been made to improve glycemic control:  Patient reports no changes Have there been any recent hospitalizations or ED visits since  last visit with CPP? Patent reports none Patient denies hypoglycemic symptoms, including None Patient denies hyperglycemic symptoms, including none How often are you checking your blood sugar? in the morning before eating or drinking What are your blood sugars ranging?  Fasting: 190 patient reports it has been consistently in this range not higher or much lower. During the week, how often does your blood glucose drop below 70? Never  Adherence Review: Is the patient currently on a STATIN medication? Yes Is the patient currently on ACE/ARB medication? Yes Does the patient have >5 day gap between last estimated fill dates? No   Care Gaps: Colonoscopy - Overdue Eye Exam - Overdue Urine Micro - Overdue TDAP - Overdue Zoster Vaccines - Overdue Foot Exam - Overdue BP- 114/72 (07/08/21) AWV- 5/22 CCM- 4/23 Lab Results  Component Value Date   HGBA1C 8.2 (H) 04/20/2021    Star Rating Drugs: Metformin (Glucophage) 500 mg - Last filled 07/13/21 90 DS at [facility] Empagliflozin (Jardiance) 25 mg - Last filled 07/08/21 30 DS at [facility] Atorvastatin (Lipitor) 40 mg - Last filled 07/05/21 90 DS at [facility] Pharmacist Assistant [phone]

## 2021-08-20 ENCOUNTER — Other Ambulatory Visit: Payer: Self-pay | Admitting: Cardiology

## 2021-08-23 ENCOUNTER — Encounter: Payer: Self-pay | Admitting: Internal Medicine

## 2021-08-23 ENCOUNTER — Ambulatory Visit (INDEPENDENT_AMBULATORY_CARE_PROVIDER_SITE_OTHER): Payer: Medicare Other | Admitting: Internal Medicine

## 2021-08-23 VITALS — BP 110/66 | HR 79 | Temp 99.4°F | Ht 66.5 in | Wt 249.2 lb

## 2021-08-23 DIAGNOSIS — Z79899 Other long term (current) drug therapy: Secondary | ICD-10-CM

## 2021-08-23 DIAGNOSIS — E1165 Type 2 diabetes mellitus with hyperglycemia: Secondary | ICD-10-CM

## 2021-08-23 DIAGNOSIS — Z7901 Long term (current) use of anticoagulants: Secondary | ICD-10-CM

## 2021-08-23 DIAGNOSIS — E785 Hyperlipidemia, unspecified: Secondary | ICD-10-CM

## 2021-08-23 DIAGNOSIS — R102 Pelvic and perineal pain: Secondary | ICD-10-CM | POA: Diagnosis not present

## 2021-08-23 DIAGNOSIS — R109 Unspecified abdominal pain: Secondary | ICD-10-CM

## 2021-08-23 DIAGNOSIS — I5032 Chronic diastolic (congestive) heart failure: Secondary | ICD-10-CM | POA: Diagnosis not present

## 2021-08-23 DIAGNOSIS — R1031 Right lower quadrant pain: Secondary | ICD-10-CM

## 2021-08-23 LAB — POCT URINALYSIS DIPSTICK
Glucose, UA: POSITIVE — AB
Leukocytes, UA: NEGATIVE
Nitrite, UA: NEGATIVE
Protein, UA: NEGATIVE
Spec Grav, UA: 1.015 (ref 1.010–1.025)
Urobilinogen, UA: 0.2 E.U./dL
pH, UA: 6 (ref 5.0–8.0)

## 2021-08-23 LAB — CBC WITH DIFFERENTIAL/PLATELET
Basophils Absolute: 0.1 10*3/uL (ref 0.0–0.1)
Basophils Relative: 0.8 % (ref 0.0–3.0)
Eosinophils Absolute: 0.3 10*3/uL (ref 0.0–0.7)
Eosinophils Relative: 3.4 % (ref 0.0–5.0)
HCT: 45.5 % (ref 36.0–46.0)
Hemoglobin: 15 g/dL (ref 12.0–15.0)
Lymphocytes Relative: 23.5 % (ref 12.0–46.0)
Lymphs Abs: 2.2 10*3/uL (ref 0.7–4.0)
MCHC: 33 g/dL (ref 30.0–36.0)
MCV: 95.5 fl (ref 78.0–100.0)
Monocytes Absolute: 0.8 10*3/uL (ref 0.1–1.0)
Monocytes Relative: 8.4 % (ref 3.0–12.0)
Neutro Abs: 5.9 10*3/uL (ref 1.4–7.7)
Neutrophils Relative %: 63.9 % (ref 43.0–77.0)
Platelets: 235 10*3/uL (ref 150.0–400.0)
RBC: 4.76 Mil/uL (ref 3.87–5.11)
RDW: 14.5 % (ref 11.5–15.5)
WBC: 9.3 10*3/uL (ref 4.0–10.5)

## 2021-08-23 LAB — BASIC METABOLIC PANEL
BUN: 23 mg/dL (ref 6–23)
CO2: 34 mEq/L — ABNORMAL HIGH (ref 19–32)
Calcium: 9.8 mg/dL (ref 8.4–10.5)
Chloride: 97 mEq/L (ref 96–112)
Creatinine, Ser: 1.23 mg/dL — ABNORMAL HIGH (ref 0.40–1.20)
GFR: 42.98 mL/min — ABNORMAL LOW (ref >60.00)
Glucose, Bld: 163 mg/dL — ABNORMAL HIGH (ref 70–99)
Potassium: 4.8 mEq/L (ref 3.5–5.1)
Sodium: 139 mEq/L (ref 135–145)

## 2021-08-23 LAB — LIPID PANEL
Cholesterol: 147 mg/dL (ref 0–200)
HDL: 47.2 mg/dL (ref >39.00)
NonHDL: 100
Total CHOL/HDL Ratio: 3
Triglycerides: 206 mg/dL — ABNORMAL HIGH (ref 0.0–149.0)
VLDL: 41.2 mg/dL — ABNORMAL HIGH (ref 0.0–40.0)

## 2021-08-23 LAB — HEPATIC FUNCTION PANEL
ALT: 45 U/L — ABNORMAL HIGH (ref 0–35)
AST: 37 U/L (ref 0–37)
Albumin: 4.5 g/dL (ref 3.5–5.2)
Alkaline Phosphatase: 92 U/L (ref 39–117)
Bilirubin, Direct: 0.1 mg/dL (ref 0.0–0.3)
Total Bilirubin: 0.6 mg/dL (ref 0.2–1.2)
Total Protein: 8 g/dL (ref 6.0–8.3)

## 2021-08-23 LAB — LDL CHOLESTEROL, DIRECT: Direct LDL: 76 mg/dL

## 2021-08-23 LAB — TSH: TSH: 4.61 u[IU]/mL (ref 0.35–5.50)

## 2021-08-23 LAB — HEMOGLOBIN A1C: Hgb A1c MFr Bld: 8 % — ABNORMAL HIGH (ref 4.6–6.5)

## 2021-08-23 MED ORDER — NYSTATIN-TRIAMCINOLONE 100000-0.1 UNIT/GM-% EX CREA: 1.0000 "application " | TOPICAL_CREAM | Freq: Two times a day (BID) | CUTANEOUS | 1 refills | Status: DC | PRN

## 2021-08-23 NOTE — Patient Instructions (Addendum)
Pain seems mechanical cause and not in abdomen.   But advise more evaluation. Blood work today . Urinalysis. Plan ct scan to check abd area .  R/o hernia  or other serious problem  Fu depending  after all  info is back or if worse

## 2021-08-23 NOTE — Progress Notes (Signed)
Chief Complaint  Patient presents with   Abdominal Pain    Right side  Refill Nystatin- Triamcinolone cream    HPI: [name] 76 y.o. come in for follow-up and difficulty about 3 to 4 weeks of sharp pain right lower quadrant groin that radiates up into her right flank area.  No associated nausea vomiting fever UTI symptoms hematuria respiratory symptoms   r groin up to r abd  around hip bone  thinks it could be a pulled muscle but there is no history of injury fall When she is just sitting around it does not bother her at all but if she gets up from sitting and certain movements rolling over in bed it causes severe pain it actually is getting better recently however her husband was concerned and it is continuing. She does not feel any masses has not had this problem before.  Getting better  and when at worse   tylenol 2 bid  some help.  Is not have to take it anymore.  Feels that her sugars have been high recently because she has been eating differently but otherwise no change in medication. Fine if don ROS: See pertinent positives and negatives per HPI.  Past Medical History:  Diagnosis Date   Anticoagulant long-term use    pradaxa   Anxiety    Arthritis    "fingers, lower back" (04/23/2017    CAD (coronary artery disease) 7412,8786   post PTCA with bare-metal stenting to mid RCA in December 2004      CHF (congestive heart failure) (HCC)    Chronic atrial fibrillation ([place]) 06/2007   Tachybradycardia pacemaker   Chronic kidney disease    10% function - ?R, other kidney is compensating     CVA (cerebral vascular accident) [address] [facility]) [phone]   denies residual on 04/23/2017   Depression    Diplopia 06/19/2008   Qualifier: Diagnosis of  By: [name] MD, [name]    Dysrhythmia    ATRIAL FIBRILATION   Edema of lower extremity    Hyperlipidemia    Hypertension    Inferior myocardial infarction ([place])    acute inferior wall mi/other medical hx   Myocardial infarction [facility])  [phone]   Obesity    OSA on CPAP    last test- 10-02-2008   Pacemaker    Pneumonia 10/02/2012   tx. ----  [facility]   Pulmonary hypertension ([place])    moderate pulmonary hypertension by 10/2016 echo and 10/2013 cardiac cath   Shortness of breath    Skin cancer    "cut off right [place]; burned off LLE" (04/23/2017)   Sleep apnea    Spondylolisthesis    TIA (transient ischemic attack) October 03, 2006   Unspecified hemorrhoids without mention of complication 2/94/7654   Colonoscopy--Dr. Carlean Purl     Family History  Problem Relation Age of Onset   Suicidality Father        suicide death pt was 3 yrs, 1948[birth year]02   Arrhythmia Mother    Hypertension Mother    Diabetes Mother    Dementia Mother    Heart attack Brother    Heart disease Paternal Aunt    Prostate cancer Maternal Grandfather    Diabetes Paternal Grandfather        fathers side of the family   Colon cancer Maternal Aunt     Social History   Socioeconomic History   Marital status: Married    Spouse name: Not on file   Number of children: 0  Years of education: HS   Highest education level: Not on file  Occupational History   Occupation: retired    Comment: previously worked [employer]  Tobacco Use   Smoking status: Former    Packs/day: 1.00    Years: 5.00    Pack years: 5.00    Types: Cigarettes    Start date: 05/11/1978    Quit date: 07/03/1982    Years since quitting: 39.1   Smokeless tobacco: Never  Vaping Use   Vaping Use: Never used  Substance and Sexual Activity   Alcohol use: Yes    Comment: 04/23/2017 "once q 6 months; glass of wine"   Drug use: No   Sexual activity: Not Currently  Other Topics Concern   Not on file  Social History Narrative   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   Married    Originally from [place] of two, high school education   Former smoker 1979-1984 1ppd   Hunting dogs 7    Retired from [employer] 2    Social Determinants  of Sales executive: Low Risk    Difficulty of Paying Living Expenses: Not hard at [employer] Insecurity: No Food Insecurity   Worried About Charity fundraiser in the Last Year: Never true   Arboriculturist in the Last Year: Never true  Transportation Needs: No Data processing manager (Medical): No   Lack of Transportation (Non-Medical): No  Physical Activity: Unknown   Days of Exercise per Week: Not on file   Minutes of Exercise per Session: 20 min  Stress: Stress Concern Present   Feeling of Stress : To some extent  Social Connections: Engineer, building services of Communication with Friends and Family: More than three times a week   Frequency of Social Gatherings with Friends and Family: More than three times a week   Attends Religious Services: 1 to 4 times per year   Active Member of [employer] or Organizations: Yes   Attends Archivist Meetings: 1 to 4 times per year   Marital Status: Married    Outpatient Medications Prior to Visit  Medication Sig Dispense Refill   apixaban (ELIQUIS) 5 MG TABS tablet Take 1 tablet (5 mg total) by mouth 2 (two) times daily. 180 tablet 1   Ascorbic Acid (VITAMIN C) 1000 MG tablet Take 1,000 mg by mouth at bedtime.      atorvastatin (LIPITOR) 40 MG tablet Take 1 tablet (40 mg total) by mouth daily. 90 tablet 1   Calcium Carbonate-Vitamin D (CALCIUM 600+D PO) Take 1 tablet by mouth daily.      diltiazem (CARDIZEM LA) 360 MG 24 hr tablet Take 1 tablet (360 mg total) by mouth daily. 90 tablet 1   dofetilide (TIKOSYN) 250 MCG capsule TAKE 1 CAPSULE BY MOUTH TWICE  DAILY 180 capsule 3   DULoxetine (CYMBALTA) 60 MG capsule TAKE 1 CAPSULE BY MOUTH EVERY DAY 90 capsule 0   fluticasone (FLONASE) 50 MCG/ACT nasal spray Place 1 spray into both nostrils at bedtime.     JARDIANCE 25 MG TABS tablet TAKE 1 TABLET BY MOUTH DAILY 30 tablet 11   loratadine (CLARITIN) 10 MG tablet TAKE 1 TABLET BY MOUTH EVERY DAY  90 tablet 0   Lysine 1000 MG TABS Take 1,000 mg by mouth at bedtime.      metFORMIN (GLUCOPHAGE) 500  MG tablet TAKE 1 TABLET (500 MG TOTAL) BY MOUTH 2 (TWO) TIMES DAILY WITH A MEAL. INCREASE TO 3 PER DAY AND THEN 4 PER DAY 360 tablet 0   metolazone (ZAROXOLYN) 2.5 MG tablet Take 1 tablet (2.5 mg total) by mouth every 4 days AS NEEDED for swelling. Please keep upcoming appt in Jan, 2023 for further refills 30 tablet 0   metoprolol tartrate (LOPRESSOR) 50 MG tablet Take 1 tablet (50 mg total) by mouth 2 (two) times daily. 180 tablet 0   Multiple Vitamin (MULTIVITAMIN WITH MINERALS) TABS tablet Take 1 tablet by mouth daily. Centrum     nitroGLYCERIN (NITROSTAT) 0.4 MG SL tablet Place 1 tablet (0.4 mg total) under the tongue every 5 (five) minutes x 3 doses as needed for chest pain. PLEASE CALL [phone] AND SCHEDULE AN OVERDUE APPT WITH DR Acie Fredrickson FOR FURTHER REFILLS TO BE GRANTED (2ND ATTEMPT) 15 tablet 0   omeprazole (PRILOSEC) 20 MG capsule Take 20 mg by mouth daily.     Polyethyl Glycol-Propyl Glycol (SYSTANE) 0.4-0.3 % SOLN Place 1 drop into both eyes at bedtime.     potassium chloride SA (KLOR-CON M) 20 MEQ tablet TAKE 2 TABLETS BY MOUTH TWICE  DAILY 360 tablet 3   torsemide (DEMADEX) 20 MG tablet TAKE 2 TABLETS BY MOUTH TWICE  DAILY 360 tablet 3   valACYclovir (VALTREX) 1000 MG tablet TAKE 2 TABS EVERY 12 HOURS ASD NEEDED 30 tablet 1   nystatin-triamcinolone (MYCOLOG II) cream Apply 1 application topically 2 (two) times daily as needed. 30 g 1   No facility-administered medications prior to visit.     EXAM:  BP 110/66 (BP Location: Left Arm, Patient Position: Sitting, Cuff Size: Normal)    Pulse 79    Temp 99.4 F (37.4 C) (Oral)    Ht 5' 6.5" (1.689 m)    Wt 249 lb 3.2 oz (113 kg)    SpO2 91%    BMI 39.62 kg/m   Body mass index is 39.62 kg/m.  GENERAL: vitals reviewed and listed above, alert, oriented, appears well hydrated and in no acute distress HEENT: atraumatic, conjunctiva   clear, no obvious abnormalities on inspection of external nose and ears OP : Masked NECK: no obvious masses on inspection palpation  Chest clear to auscultation CV: HRRR, every third or fourth beat is irregular no clubbing cyanosis or  peripheral edema nl cap refill  Abdomen soft without again a megaly guarding or rebound examination of abdomen standing and laying no obvious hernia but location is very low on the right abdomen just above the inguinal area.  Negative CVA tenderness or significant hepatosplenomegaly liver 1 cm down right costal margin.  No fluid wave is noted.  No spine tenderness. Rotation hip joint does not cause pain. MS: moves all extremities without noticeable focal  abnormality notices pain when arising from sitting to standing. PSYCH: pleasant and cooperative, no obvious depression or anxiety Lab Results  Component Value Date   WBC 9.3 08/23/2021   HGB 15.0 08/23/2021   HCT 45.5 08/23/2021   PLT 235.0 08/23/2021   GLUCOSE 163 (H) 08/23/2021   CHOL 147 08/23/2021   TRIG 206.0 (H) 08/23/2021   HDL 47.20 08/23/2021   LDLDIRECT 76.0 08/23/2021   LDLCALC 68 04/10/2019   ALT 45 (H) 08/23/2021   AST 37 08/23/2021   NA 139 08/23/2021   K 4.8 08/23/2021   CL 97 08/23/2021   CREATININE 1.23 (H) 08/23/2021   BUN 23 08/23/2021   CO2 34 (  H) 08/23/2021   TSH 4.61 08/23/2021   INR 1.3 (H) 02/17/2020   HGBA1C 8.0 (H) 08/23/2021   MICROALBUR <0.7 01/23/2017   BP Readings from Last 3 Encounters:  08/23/21 110/66  07/08/21 114/72  04/20/21 114/62  Ate hashbrowns this morning with water.  ASSESSMENT AND PLAN:  Discussed the following assessment and plan:  Radiating abdominal pain - Plan: Basic metabolic panel, CBC with Differential/Platelet, Hemoglobin A1c, Hepatic function panel, TSH, Lipid panel, POC Urinalysis Dipstick, Lipid panel, TSH, Hepatic function panel, Hemoglobin A1c, CBC with Differential/Platelet, Basic metabolic panel, CT Abdomen Pelvis W Contrast  Groin  pain, right - Plan: Basic metabolic panel, CBC with Differential/Platelet, Hemoglobin A1c, Hepatic function panel, TSH, Lipid panel, Lipid panel, TSH, Hepatic function panel, Hemoglobin A1c, CBC with Differential/Platelet, Basic metabolic panel, CT Abdomen Pelvis W Contrast  Type 2 diabetes mellitus with hyperglycemia, without long-term current use of insulin (HCC) - Plan: Basic metabolic panel, CBC with Differential/Platelet, Hemoglobin A1c, Hepatic function panel, TSH, Lipid panel, Lipid panel, TSH, Hepatic function panel, Hemoglobin A1c, CBC with Differential/Platelet, Basic metabolic panel  Medication management - Plan: Basic metabolic panel, CBC with Differential/Platelet, Hemoglobin A1c, Hepatic function panel, TSH, Lipid panel, Lipid panel, TSH, Hepatic function panel, Hemoglobin A1c, CBC with Differential/Platelet, Basic metabolic panel  Chronic diastolic heart failure (HCC) - Plan: Basic metabolic panel, CBC with Differential/Platelet, Hemoglobin A1c, Hepatic function panel, TSH, Lipid panel, Lipid panel, TSH, Hepatic function panel, Hemoglobin A1c, CBC with Differential/Platelet, Basic metabolic panel  Anticoagulant long-term use - Plan: Basic metabolic panel, CBC with Differential/Platelet, Hemoglobin A1c, Hepatic function panel, TSH, Lipid panel, Lipid panel, TSH, Hepatic function panel, Hemoglobin A1c, CBC with Differential/Platelet, Basic metabolic panel  Right lower quadrant abdominal pain - Plan: CT Abdomen Pelvis W Contrast This may be mechanical but ongoing and concern of family check labs today urinalysis no obvious infection Imaging study CT scan.  Then plan follow-up if persistent progressive. -Patient advised to return or notify health care team  if  new concerns arise.  Patient Instructions  Pain seems mechanical cause and not in abdomen.   But advise more evaluation. Blood work today . Urinalysis. Plan ct scan to check abd area .  R/o hernia  or other serious problem  Fu  depending  after all  info is back or if worse    Mariann Laster K. [name] M.D.a

## 2021-08-25 ENCOUNTER — Ambulatory Visit
Admission: RE | Admit: 2021-08-25 | Discharge: 2021-08-25 | Disposition: A | Discharge status: Home / Self Care | Payer: Medicare Other | Source: Ambulatory Visit | Attending: Internal Medicine | Admitting: Internal Medicine

## 2021-08-25 ENCOUNTER — Other Ambulatory Visit: Payer: Self-pay

## 2021-08-25 DIAGNOSIS — R1031 Right lower quadrant pain: Secondary | ICD-10-CM

## 2021-08-25 DIAGNOSIS — N261 Atrophy of kidney (terminal): Secondary | ICD-10-CM | POA: Diagnosis not present

## 2021-08-25 DIAGNOSIS — E785 Hyperlipidemia, unspecified: Secondary | ICD-10-CM

## 2021-08-25 DIAGNOSIS — K7689 Other specified diseases of liver: Secondary | ICD-10-CM | POA: Diagnosis not present

## 2021-08-25 DIAGNOSIS — I700 Atherosclerosis of aorta: Secondary | ICD-10-CM | POA: Diagnosis not present

## 2021-08-25 DIAGNOSIS — R109 Unspecified abdominal pain: Secondary | ICD-10-CM

## 2021-08-25 DIAGNOSIS — D7389 Other diseases of spleen: Secondary | ICD-10-CM | POA: Diagnosis not present

## 2021-08-25 MED ORDER — IOPAMIDOL (ISOVUE-300) INJECTION 61%
80.0000 mL | Freq: Once | INTRAVENOUS | Status: AC | PRN
  Administered 2021-08-25: 80 mL via INTRAVENOUS

## 2021-08-25 NOTE — Progress Notes (Signed)
Ct shows no masses hernia  or findings that would cause acute pain . Liver shows some changes  that could  be consistent with cirrhosis    but no masses I think this was present previously  ( most common cause if fatty liver   best addressed by healthy eating  diabetes control.)    See lab results)   advise no lifting give more time to immprove   FU  visit  3 months   (  fu  earlier if  pain not getting better in next 2-4 weeks)

## 2021-08-25 NOTE — Progress Notes (Signed)
A1c is 8   need to get to 7 range or lower .  Liver tests are better  ( see ct report)    kidney function stable . No anemia and thyroid is normal

## 2021-08-27 ENCOUNTER — Other Ambulatory Visit: Payer: Self-pay | Admitting: Internal Medicine

## 2021-08-28 ENCOUNTER — Other Ambulatory Visit: Payer: Self-pay | Admitting: Internal Medicine

## 2021-08-29 ENCOUNTER — Other Ambulatory Visit: Payer: Self-pay | Admitting: Cardiology

## 2021-09-06 ENCOUNTER — Other Ambulatory Visit: Payer: Self-pay | Admitting: Internal Medicine

## 2021-09-09 ENCOUNTER — Other Ambulatory Visit: Payer: Self-pay | Admitting: Internal Medicine

## 2021-09-13 ENCOUNTER — Other Ambulatory Visit: Payer: Self-pay | Admitting: Internal Medicine

## 2021-09-21 ENCOUNTER — Other Ambulatory Visit (HOSPITAL_COMMUNITY): Payer: Self-pay | Admitting: Nurse Practitioner

## 2021-09-30 ENCOUNTER — Telehealth: Payer: Self-pay | Admitting: Pharmacist

## 2021-09-30 NOTE — Chronic Care Management (AMB) (Signed)
? ? ?  Chronic Care Management ?Pharmacy Assistant  ? ?Name: [name]  MRN: [id number] DOB: [birth year] ? ?09/30/21 APPOINTMENT REMINDER ? ? ?Patient was reminded to have all medications, supplements and any blood glucose and blood pressure readings available for review with [name], Pharm. D, for telephone visit on 10/03/21 at 11. ? ? ? ?Care Gaps: ?TDAP - Overdue ?AWV- 5/22 ?BP-110/66 ( 08/23/21) ?Lab Results  ?Component Value Date  ? HGBA1C 8.0 (H) 08/23/2021  ? ? ?Star Rating Drug: ?Metformin (Glucophage) 500 mg - Last filled 07/13/21 90 DS at [facility] ?Empagliflozin (Jardiance) 25 mg - Last filled 08/04/21 100 DS at [facility] ?Atorvastatin (Lipitor) 40 mg - Last filled 08/1721 100 DS at [facility] ? ?Any gaps in medications fill history? ? ? ? ?SIG:   ? ?Medications: ?Outpatient Encounter Medications as of 09/30/2021  ?Medication Sig  ? apixaban (ELIQUIS) 5 MG TABS tablet Take 1 tablet (5 mg total) by mouth 2 (two) times daily.  ? Ascorbic Acid (VITAMIN C) 1000 MG tablet Take 1,000 mg by mouth at bedtime.   ? atorvastatin (LIPITOR) 40 MG tablet Take 1 tablet (40 mg total) by mouth daily.  ? Calcium Carbonate-Vitamin D (CALCIUM 600+D PO) Take 1 tablet by mouth daily.   ? diltiazem (CARDIZEM LA) 360 MG 24 hr tablet Take 1 tablet (360 mg total) by mouth daily.  ? dofetilide (TIKOSYN) 250 MCG capsule TAKE ONE CAPSULE BY MOUTH TWICE A DAY  ? DULoxetine (CYMBALTA) 60 MG capsule TAKE 1 CAPSULE BY MOUTH EVERY DAY  ? fluticasone (FLONASE) 50 MCG/ACT nasal spray Place 1 spray into both nostrils at bedtime.  ? JARDIANCE 25 MG TABS tablet TAKE 1 TABLET BY MOUTH DAILY  ? loratadine (CLARITIN) 10 MG tablet TAKE 1 TABLET BY MOUTH EVERY DAY  ? Lysine 1000 MG TABS Take 1,000 mg by mouth at bedtime.   ? metFORMIN (GLUCOPHAGE) 500 MG tablet TAKE 1 TABLET (500 MG TOTAL) BY MOUTH 2 (TWO) TIMES DAILY WITH A MEAL. INCREASE TO 3 PER DAY AND THEN 4 PER DAY  ? metolazone (ZAROXOLYN) 2.5 MG tablet TAKE 1 TABLET BY MOUTH EVERY 4  DAYS AS NEEDED FOR  SWELLING  PLEASE KEEP UPCOMING APPOINTMENT IN JAN, 2023 FOR FURTHER REFILLS  ? metoprolol tartrate (LOPRESSOR) 50 MG tablet TAKE 1 TABLET BY MOUTH TWICE A DAY  ? Multiple Vitamin (MULTIVITAMIN WITH MINERALS) TABS tablet Take 1 tablet by mouth daily. Centrum  ? nitroGLYCERIN (NITROSTAT) 0.4 MG SL tablet Place 1 tablet (0.4 mg total) under the tongue every 5 (five) minutes x 3 doses as needed for chest pain. PLEASE CALL [phone] AND SCHEDULE AN OVERDUE APPT WITH DR Acie Fredrickson FOR FURTHER REFILLS TO BE GRANTED (2ND ATTEMPT)  ? nystatin-triamcinolone (MYCOLOG II) cream Apply 1 application topically 2 (two) times daily as needed.  ? omeprazole (PRILOSEC) 20 MG capsule Take 20 mg by mouth daily.  ? Polyethyl Glycol-Propyl Glycol (SYSTANE) 0.4-0.3 % SOLN Place 1 drop into both eyes at bedtime.  ? potassium chloride SA (KLOR-CON M) 20 MEQ tablet TAKE 2 TABLETS BY MOUTH TWICE  DAILY  ? torsemide (DEMADEX) 20 MG tablet TAKE 2 TABLETS BY MOUTH TWICE  DAILY  ? valACYclovir (VALTREX) 1000 MG tablet TAKE 2 TABLETS BY MOUTH EVERY 12 HOURS AS NEEDED  ? ?No facility-administered encounter medications on file as of 09/30/2021.  ? ? ? ? ?Ned Clines CMA ?Clinical Pharmacist Assistant ?[phone] ? ?

## 2021-10-03 ENCOUNTER — Ambulatory Visit (INDEPENDENT_AMBULATORY_CARE_PROVIDER_SITE_OTHER): Payer: Medicare Other | Admitting: Pharmacist

## 2021-10-03 DIAGNOSIS — R1013 Epigastric pain: Secondary | ICD-10-CM

## 2021-10-03 DIAGNOSIS — E1165 Type 2 diabetes mellitus with hyperglycemia: Secondary | ICD-10-CM

## 2021-10-03 NOTE — Patient Instructions (Signed)
Hi [name], ? ?It was great to catch up! I am so sorry you are feeling this way about your diabetes and how you are feeling in general. I will reach out once I talk to Dr. Regis [name] about switching to the other metformin. ? ?Best, ?[name] ? ?[name], PharmD, BCACP ?Clinical Pharmacist ?Therapist, music at [facility] ?[phone] ? ? Visit Information ? ? Goals Addressed   ?None ?  ? ?Patient Care Plan: [name]  ?  ? ?Problem Identified: Problem: Hypertension, Hyperlipidemia, Diabetes, Atrial Fibrillation, Heart Failure, Coronary Artery Disease, Anxiety and Allergic Rhinitis   ?  ? ?Long-Range Goal: Patient-Specific Goal   ?Start Date: 11/26/2020  ?Expected End Date: 11/26/2021  ?Recent Progress: On track  ?Priority: High  ?Note:   ?Current Barriers:  ?Unable to maintain control of diabetes ? ?Pharmacist Clinical Goal(s):  ?Patient will achieve adherence to monitoring guidelines and medication adherence to achieve therapeutic efficacy ?achieve control of diabetes as evidenced by home blood sugar readings and A1c  through collaboration with PharmD and provider.  ? ?Interventions: ?1:1 collaboration with [name], Samantha Brooking, MD regarding development and update of comprehensive plan of care as evidenced by provider attestation and co-signature ?Inter-disciplinary care team collaboration (see longitudinal plan of care) ?Comprehensive medication review performed; medication list updated in electronic medical record ? ?Hypertension (BP goal <130/80) ?-Not ideally controlled ?-Current treatment: ?Diltiazem (Cardizem CD) '360mg'$ , 1 capsule once daily  - Appropriate, Query effective, Safe, Accessible ?Metoprolol tartrate 50 mg 1 tablet twice daily - Appropriate, Query effective, Safe, Accessible ?-Medications previously tried: indapamide, losartan ?-Current home readings: 125/70 (couple times a week) ?-Current dietary habits: did not discuss ?-Current exercise habits: limited due to shortness of breath ?-Denies  hypotensive/hypertensive symptoms ?-Educated on Importance of home blood pressure monitoring; ?Proper BP monitoring technique; ?-Counseled to monitor BP at home weekly, document, and provide log at future appointments ?-Counseled on diet and exercise extensively ?Recommended to continue current medication ? ?Hyperlipidemia: (LDL goal < 70) ?-Not ideally controlled ?-Current treatment: ?Atorvastatin '40mg'$ , 1 tablet once daily - Appropriate, Query effective, Safe, Accessible ?-Medications previously tried: none  ?-Current dietary patterns: did not discuss ?-Current exercise habits: limited due to shortness of breath ?-Educated on Cholesterol goals;  ?Importance of limiting foods high in cholesterol; ?-Counseled on diet and exercise extensively ?Recommended to continue current medication ? ?CAD (Goal: prevent heart attacks and strokes) ?-Controlled ?-Current treatment  ?Atorvastatin '40mg'$ , 1 tablet daily  - Appropriate, Effective, Safe, Accessible ?Nitroglycerin 0.'4mg'$  SL tablet, place 1 tablet under tongue every five minutes for 3 doses as needed for chest pain - Appropriate, Effective, Safe, Accessible ?-Medications previously tried: none  ?-Recommended to continue current medication ? ? ?Diabetes (A1c goal <7%) ?-Not ideally controlled ?-Current medications: ?Metformin 500 mg 2 tablets twice daily - Appropriate, Query effective, Safe, Accessible ?Jardiance 25 mg 1 tablet daily - Appropriate, Query effective, Safe, Accessible ?-Medications previously tried: [place] (cost)  ?-Current home glucose readings ?fasting glucose: 205, 151 ?post prandial glucose: n/a ?-Denies hypoglycemic/hyperglycemic symptoms ?-Current meal patterns:  ?breakfast: premier protein shake with fruit ?lunch: salad ?dinner: steak, salad and bread ?snacks: did not discuss ?drinks: did not discuss ?-Current exercise: limited with shortness of breath ?-Educated on A1c and blood sugar goals; ?Benefits of routine self-monitoring of blood  sugar; ?Carbohydrate counting and/or plate method ?-Counseled to check feet daily and get yearly eye exams ?-Counseled on diet and exercise extensively ?Recommended switching to metformin XR to minimize GI discomfort. ? ?Atrial Fibrillation (Goal: prevent stroke and major  bleeding) ?-Controlled ?-CHADSVASC: 6 ?-Current treatment: ?Rate control: Diltiazem (Cardizem CD) '360mg'$ , 1 capsule once daily, dofetilide 250 mcg 1 capsule twice daily - Appropriate, Effective, Safe, Accessible ?Anticoagulation: Eliquis '5mg'$ , 1 tablet twice daily  - Appropriate, Effective, Safe, Accessible ?-Medications previously tried: dronedarone (patient reported retaining fluid), amiodarone, dofetilide, sotalol  ?-Home BP and HR readings: did not discuss  ?-Counseled on importance of adherence to anticoagulant exactly as prescribed; ?avoidance of NSAIDs due to increased bleeding risk with anticoagulants; ?-Recommended to continue current medication ? ?Heart Failure (Goal: manage symptoms and prevent exacerbations) ?-Controlled ?-Last ejection fraction: 60-65% (Date: 05/20/2020) ?-HF type: Diastolic ?-NYHA Class: III (marked limitation of activity) ?-AHA HF Stage: C (Heart disease and symptoms present) ?-Current treatment: ?Torsemide '20mg'$ , 2 tablets twice daily - Appropriate, Effective, Safe, Accessible ?Klor-con 3mq, 2 tablets twice daily - Appropriate, Effective, Safe, Accessible ?Jardiance 25 mg 1 tablet daily - Appropriate, Effective, Safe, Accessible ?Metoprolol tartrate 50 mg 1 tablet twice daily- Appropriate, Effective, Safe, Accessible ?Metolazone 2.5 mg 1 tablet every 4 days as needed - Appropriate, Effective, Safe, Accessible ?-Medications previously tried: n/a  ?-Current home BP/HR readings: did not discuss ?-Current dietary habits: limiting salt intake ?-Current exercise habits: limited due to shortness of breath ?-Educated on Importance of weighing daily; if you gain more than 3 pounds in one day or 5 pounds in one week, call  cardiologist ?-Recommended to continue current medication ? ?Stress/Anxiety (Goal: minimize symptoms) ?-Controlled ?-Current treatment: ?Duloxetine '60mg'$ ,1 capsule once daily - Appropriate, Effective, Safe, Accessible ?-Medications previously tried/failed: fluoxetine ?-PHQ9: 0 ?-GAD7: n/a ?-Educated on Benefits of medication for symptom control ?-Recommended to continue current medication ? ?Allergic rhinitis (Goal: minimize symptoms) ?-Controlled ?-Current treatment  ?Fluticasone (Flonase) 548m/ act nasal spray, 1 spray into both nostrils daily  - Appropriate, Effective, Safe, Accessible ?Loratadine '10mg'$ ,1 tablet once daily - Appropriate, Effective, Safe, Accessible ?-Medications previously tried: none  ?-Recommended to continue current medication ? ?GERD (Goal: minimize symptoms) ?-Controlled ?-Current treatment  ?Omeprazole '20mg'$ , 1 capsule twice daily - Appropriate, Effective, Safe, Accessible ?-Medications previously tried: none  ?-Recommended to continue current medication ?Patient is taking for stomach pain and reports it is helping now with twice daily ? ?Bone health (Goal prevent fractures) ?-Controlled ?-Last DEXA Scan: 04/08/2014 ? T-Score femoral neck: 0.85 ? T-Score total hip: n/a ? T-Score lumbar spine: n/a ? T-Score forearm radius: n/a ? 10-year probability of major osteoporotic fracture: 5.9% ? 10-year probability of hip fracture: 0.2% ?-Patient is not a candidate for pharmacologic treatment ?-Current treatment  ?Calcium carbonate/ vitamin D, 1 tablet at bedtime ?Cholecalciferol (12571m  5000 units, 1 tablet every evening ?-Medications previously tried: none  ?-Recommend [id number] units of vitamin D daily. Recommend 1200 mg of calcium daily from dietary and supplemental sources. Recommend weight-bearing and muscle strengthening exercises for building and maintaining bone density. ?-Recommended to continue current medication ? ?Health Maintenance ?-Vaccine gaps: Shingrix ?-Current therapy:  ?Ascorbic  acid '1000mg'$ , 1 tablet once daily ?APAP '500mg'$ , 2 in the AM, and 2 in the PM  ?Diphenhydramine/ APAP, 25-'500mg'$ , 2 tablets at bedtime  ?Lysine '1000mg'$ , 1 tablet at bedtime  ?Multivitamin, 1 tablet once daily  ?Systane, 1 drop int

## 2021-10-03 NOTE — Progress Notes (Signed)
? ?Chronic Care Management ?Pharmacy Note ? ?10/03/2021 ?Name:  [name] MRN:  [id number] DOB:  1946-06-13 ? ?Summary: ?A1c not at goal of < 7% ?  ?Recommendations/Changes made from today's visit: ?-Recommended switching metformin to 1000 mg XR BID to minimize GI discomfort ?-Recommended alternating checking blood sugars different times of day ?  ?Plan: ?Follow up DM assessment in 1 month ? ?Subjective: ?[name] is an 76 y.o. year old female who is a primary patient of [name], [name], MD.  The CCM team was consulted for assistance with disease management and care coordination needs.   ? ?Engaged with patient by telephone for follow up visit in response to provider referral for pharmacy case management and/or care coordination services.  ? ?Consent to Services:  ?The patient was given information about Chronic Care Management services, agreed to services, and gave verbal consent prior to initiation of services.  Please see initial visit note for detailed documentation.  ? ?Patient Care Team: ?[name], [name], MD as PCP - General ?Constance Haw, MD as PCP - Electrophysiology (Cardiology) ?Nahser, Wonda Cheng, MD as PCP - Cardiology (Cardiology) ?Gaynelle Arabian, MD as Consulting Physician (Orthopedic Surgery) ?Brand Males, MD as Consulting Physician (Pulmonary Disease) ?Viona Gilmore, [facility] as Pharmacist (Pharmacist) ? ?Recent office visits: ?08/23/21 [name], MD: Presented for diabetes follow- up and abdominal pain. A1c slightly decreased to 8.0%. ? ?04/20/21 [name], [name] - Patient presented for queasiness and other concerns. No medication changes. A1c increased to 8.2%. ? ?Recent consult visits: ?07/08/21 Constance Haw, MD Cardiology patient present for Afib and SOB follow up. Follow up in 3 months. ? ?05/04/21 Jarome Matin, MD (dermatology): Patient presented for follow up. Unable to access notes. ? ?Hospital visits: ?None in previous 6 months ? ?Objective: ? ?Lab Results  ?Component Value  Date  ? CREATININE 1.23 (H) 08/23/2021  ? BUN 23 08/23/2021  ? GFR 42.98 (L) 08/23/2021  ? GFRNONAA 45 (L) 08/13/2020  ? GFRAA 40 (L) 07/28/2020  ? NA 139 08/23/2021  ? K 4.8 08/23/2021  ? CALCIUM 9.8 08/23/2021  ? CO2 34 (H) 08/23/2021  ? GLUCOSE 163 (H) 08/23/2021  ? ? ?Lab Results  ?Component Value Date/Time  ? HGBA1C 8.0 (H) 08/23/2021 09:35 AM  ? HGBA1C 8.2 (H) 04/20/2021 11:30 AM  ? GFR 42.98 (L) 08/23/2021 09:35 AM  ? GFR 36.88 (L) 04/20/2021 11:30 AM  ? MICROALBUR <0.7 01/23/2017 10:51 AM  ?  ?Last diabetic Eye exam:  ?Lab Results  ?Component Value Date/Time  ? HMDIABEYEEXA No Retinopathy 07/05/2021 03:22 PM  ?  ?Last diabetic Foot exam: No results found for: HMDIABFOOTEX  ? ?Lab Results  ?Component Value Date  ? CHOL 147 08/23/2021  ? HDL 47.20 08/23/2021  ? Wagner 68 04/10/2019  ? LDLDIRECT 76.0 08/23/2021  ? TRIG 206.0 (H) 08/23/2021  ? CHOLHDL 3 08/23/2021  ? ? ? ?  Latest Ref Rng & Units 08/23/2021  ?  9:35 AM 04/20/2021  ? 11:30 AM 01/28/2020  ? 12:01 PM  ?Hepatic Function  ?Total Protein 6.0 - 8.3 g/dL 8.0   7.7   7.5    ?Albumin 3.5 - 5.2 g/dL 4.5   4.3     ?AST 0 - 37 U/L 37   60   33    ?ALT 0 - 35 U/L 45   69   28    ?Alk Phosphatase 39 - 117 U/L 92   86     ?Total Bilirubin 0.2 -  1.2 mg/dL 0.6   0.6   0.5    ?Bilirubin, Direct 0.0 - 0.3 mg/dL 0.1   0.1   0.1    ? ? ?Lab Results  ?Component Value Date/Time  ? TSH 4.61 08/23/2021 09:35 AM  ? TSH 4.37 04/20/2021 11:30 AM  ? FREET4 0.86 04/20/2021 11:30 AM  ? FREET4 0.83 05/06/2018 09:23 AM  ? ? ? ?  Latest Ref Rng & Units 08/23/2021  ?  9:35 AM 04/20/2021  ? 11:30 AM 06/26/2020  ?  8:43 AM  ?CBC  ?WBC 4.0 - 10.5 K/uL 9.3   8.5   6.3    ?Hemoglobin 12.0 - 15.0 g/dL 15.0   14.8   14.5    ?Hematocrit 36.0 - 46.0 % 45.5   46.2   44.3    ?Platelets 150.0 - 400.0 K/uL 235.0   283.0   197    ? ? ?Lab Results  ?Component Value Date/Time  ? VD25OH 32.83 01/23/2017 10:51 AM  ? ? ?Clinical ASCVD: No  ?The 10-year ASCVD risk score (Arnett DK, et al., 2019) is:  27.9% ?  Values used to calculate the score: ?    Age: 65 years ?    Sex: Female ?    Is Non-Hispanic African American: No ?    Diabetic: Yes ?    Tobacco smoker: No ?    Systolic Blood Pressure: 939 mmHg ?    Is BP treated: Yes ?    HDL Cholesterol: 47.2 mg/dL ?    Total Cholesterol: 147 mg/dL   ? ? ?  11/30/2020  ?  8:50 AM 11/17/2019  ? 11:36 AM 05/06/2018  ?  8:39 AM  ?Depression screen PHQ 2/9  ?Decreased Interest 0 0 0  ?Down, Depressed, Hopeless 1 0 0  ?PHQ - 2 Score 1 0 0  ?  ? ?CHA2DS2/VAS Stroke Risk Points  Current as of about an hour ago  ?   6 >= 2 Points: High Risk  ?1 - 1.99 Points: Medium Risk  ?0 Points: Low Risk  ?  Last Change: N/A   ?  ? Details   ? This score determines the patient's risk of having a stroke if the  ?patient has atrial fibrillation.  ?  ?  ? Points Metrics  ?1 Has Congestive Heart Failure:  Yes   ? Current as of about an hour ago  ?1 Has Vascular Disease:  Yes   ? Current as of about an hour ago  ?1 Has Hypertension:  Yes   ? Current as of about an hour ago  ?1 Age:  40   ? Current as of about an hour ago  ?1 Has Diabetes:  Yes   ? Current as of about an hour ago  ?0 Had Stroke:  No  Had TIA:  No  Had Thromboembolism:  No   ? Current as of about an hour ago  ?1 Female:  Yes   ? Current as of about an hour ago  ?  ? ?Social History  ? ?Tobacco Use  ?Smoking Status Former  ? Packs/day: 1.00  ? Years: 5.00  ? Pack years: 5.00  ? Types: Cigarettes  ? Start date: 05/11/1978  ? Quit date: 07/03/1982  ? Years since quitting: 39.2  ?Smokeless Tobacco Never  ? ?BP Readings from Last 3 Encounters:  ?08/23/21 110/66  ?07/08/21 114/72  ?04/20/21 114/62  ? ?Pulse Readings from Last 3 Encounters:  ?08/23/21 79  ?07/08/21 63  ?04/20/21  67  ? ?Wt Readings from Last 3 Encounters:  ?08/23/21 249 lb 3.2 oz (113 kg)  ?07/08/21 251 lb 12.8 oz (114.2 kg)  ?04/20/21 251 lb 9.6 oz (114.1 kg)  ? ?BMI Readings from Last 3 Encounters:  ?08/23/21 39.62 kg/m?  ?07/08/21 40.03 kg/m?  ?04/20/21 40.00 kg/m?   ? ? ?Assessment/Interventions: Review of patient past medical history, allergies, medications, health status, including review of consultants reports, laboratory and other test data, was performed as part of comprehensive evaluation and provision of chronic care management services.  ? ?SDOH:  (Social Determinants of Health) assessments and interventions performed: No ? ?SDOH Screenings  ? ?Alcohol Screen: Not on file  ?Depression (PHQ2-9): Low Risk   ? PHQ-2 Score: 1  ?Financial Resource Strain: Low Risk   ? Difficulty of Paying Living Expenses: Not hard at all  ?Food Insecurity: No Food Insecurity  ? Worried About Charity fundraiser in the Last Year: Never true  ? Ran Out of Food in the Last Year: Never true  ?Housing: Low Risk   ? Last Housing Risk Score: 0  ?Physical Activity: Unknown  ? Days of Exercise per Week: Not on file  ? Minutes of Exercise per Session: 20 min  ?Social Connections: Socially Integrated  ? Frequency of Communication with Friends and Family: More than three times a week  ? Frequency of Social Gatherings with Friends and Family: More than three times a week  ? Attends Religious Services: 1 to 4 times per year  ? Active Member of Clubs or Organizations: Yes  ? Attends Archivist Meetings: 1 to 4 times per year  ? Marital Status: Married  ?Stress: Stress Concern Present  ? Feeling of Stress : To some extent  ?Tobacco Use: Medium Risk  ? Smoking Tobacco Use: Former  ? Smokeless Tobacco Use: Never  ? Passive Exposure: Not on file  ?Transportation Needs: No Transportation Needs  ? Lack of Transportation (Medical): No  ? Lack of Transportation (Non-Medical): No  ? ? ? ?CCM Care Plan ? ?Allergies  ?Allergen Reactions  ? Hydrocodone-Guaifenesin Other (See Comments)  ?  Confusion/ memory loss  ? Adhesive [Tape] Rash and Other (See Comments)  ?  Allergic to defibrillation pads.  ? Pedi-Pre Tape Spray [Wound Dressing Adhesive] Other (See Comments) and Rash  ?  Allergic to defibrillation  pads.  ? ? ?Medications Reviewed Today   ? ? Reviewed by Viona Gilmore, [place] (Pharmacist) on 10/03/21 at [place] List Status: <None>  ? ?Medication Order Taking? Sig Documenting Provider Last Dose Status Info

## 2021-10-04 NOTE — Progress Notes (Signed)
? ?Cardiology Office Note ?Date:  10/06/2021  ?Patient ID:  [name], DOB 05-Oct-1945, MRN [id number] ?PCP:  [name], MD  ?Electrophysiologist: Dr. Lovena Le >> Dr. Curt Bears ? ?  ?Chief Complaint:  3 mo ? ?History of Present Illness: ?[name] is a 76 y.o. female with history of CAD (remote PCI to  RCA), tachy-brady w/PPM, AFib, CKD (III), HTN, HLD, chronic CHF (diastolic), OSA w/CPAP. ? ?She comes in today to be seen for Dr. Curt Bears, last seen by him Jan 2023, , she was in AFlutter, onset that AM and more SOB with it then at her usual baseline.  Attempts to pace terminate the flutter failed, with plans to check her rhythm in a week, plan for DCCV if needed, BNP planned. ? ?Pro BNP only 523 ? ?I don't see a f/u device check/visit/note ? ?TODAY ?She mentions symptoms of palpitations for the most part daily, most are momentary/ single beats here/there less often a more persistent awareness of AFib, though feels like she has quite a bit of AF. ?Infrequent sharp/fleeting type CP ?No SOB ?She will occasionally get a little lightheaded upon standing up, no near syncope or syncope ?No bleeding, signs of bleeding ? ? ? ?Device information ?Abbott dual chamber PPM implanted 06/13/2007 ? ?AFib/AAD Hx ?Tikosyn started 2009 ?02/07/2018: PVI and CTI ablation ?03/13/2019:  posterior wall and pulmonary veins remained isolated.  Ablation was performed from the right upper pulmonary vein to the mitral valve. ?07/07/2020: ablation of the anterior wall of the left atrium.  Additional ablation of the cavotricuspid isthmus ? ? ?Past Medical History:  ?Diagnosis Date  ? Anticoagulant long-term use   ? pradaxa  ? Anxiety   ? Arthritis   ? "fingers, lower back" (04/23/2017   ? CAD (coronary artery disease) 1324,4010  ? post PTCA with bare-metal stenting to mid RCA in December 2004     ? CHF (congestive heart failure) ([place])   ? Chronic atrial fibrillation ([place]) 06/2007  ? Tachybradycardia pacemaker  ? Chronic kidney disease   ? 10%  function - ?R, other kidney is compensating    ? CVA (cerebral vascular accident) [facility]) [phone],3664  ? denies residual on 04/23/2017  ? Depression   ? Diplopia 06/19/2008  ? Qualifier: Diagnosis of  By: [name] MD, [name]   ? Dysrhythmia   ? ATRIAL FIBRILATION  ? Edema of lower extremity   ? Hyperlipidemia   ? Hypertension   ? Inferior myocardial infarction [facility])   ? acute inferior wall mi/other medical hx  ? Myocardial infarction [facility]) 4034,7425  ? Obesity   ? OSA on CPAP   ? last test- 2010  ? Pacemaker   ? Pneumonia 2014  ? tx. ----  [facility]  ? Pulmonary hypertension ([place])   ? moderate pulmonary hypertension by 10/2016 echo and 10/2013 cardiac cath  ? Shortness of breath   ? Skin cancer   ? "cut off right [place]; burned off LLE" (04/23/2017)  ? Sleep apnea   ? Spondylolisthesis   ? TIA (transient ischemic attack) 2008  ? Unspecified hemorrhoids without mention of complication 9/56/3875  ? Colonoscopy--Dr. Carlean Purl   ? ? ?Past Surgical History:  ?Procedure Laterality Date  ? ABDOMINAL HYSTERECTOMY    ? APPENDECTOMY  1984  ? ATRIAL FIBRILLATION ABLATION N/A 09/29/2016  ? Procedure: Atrial Fibrillation Ablation;  Surgeon: Will Meredith Leeds, MD;  Location: [place] CV LAB;  Service: Cardiovascular;  Laterality: N/A;  ? ATRIAL FIBRILLATION ABLATION N/A 02/07/2018  ?  Procedure: ATRIAL FIBRILLATION ABLATION;  Surgeon: Constance Haw, MD;  Location: [place] CV LAB;  Service: Cardiovascular;  Laterality: N/A;  ? ATRIAL FIBRILLATION ABLATION N/A 03/13/2019  ? Procedure: ATRIAL FIBRILLATION ABLATION;  Surgeon: Constance Haw, MD;  Location: [place] CV LAB;  Service: Cardiovascular;  Laterality: N/A;  ? ATRIAL FIBRILLATION ABLATION N/A 07/07/2020  ? Procedure: ATRIAL FIBRILLATION ABLATION;  Surgeon: Constance Haw, MD;  Location: [place] CV LAB;  Service: Cardiovascular;  Laterality: N/A;  ? BACK SURGERY    ? CARDIOVERSION N/A 09/12/2017  ? Procedure: CARDIOVERSION;  Surgeon: Jerline Pain,  MD;  Location: [facility] ENDOSCOPY;  Service: Cardiovascular;  Laterality: N/A;  ? CARDIOVERSION N/A 12/13/2017  ? Procedure: CARDIOVERSION;  Surgeon: Sanda Klein, MD;  Location: [place];  Service: Cardiovascular;  Laterality: N/A;  ? CARDIOVERSION N/A 02/18/2018  ? Procedure: CARDIOVERSION;  Surgeon: Dorothy Spark, MD;  Location: [facility] ENDOSCOPY;  Service: Cardiovascular;  Laterality: N/A;  ? CARDIOVERSION N/A 05/20/2018  ? Procedure: CARDIOVERSION;  Surgeon: [name], MD;  Location: [facility] ENDOSCOPY;  Service: Cardiovascular;  Laterality: N/A;  ? CARDIOVERSION N/A 04/16/2019  ? Procedure: CARDIOVERSION;  Surgeon: Donato Heinz, MD;  Location: [facility] ENDOSCOPY;  Service: Endoscopy;  Laterality: N/A;  ? CARDIOVERSION N/A 01/22/2020  ? Procedure: CARDIOVERSION;  Surgeon: Geralynn Rile, MD;  Location: [place];  Service: Cardiovascular;  Laterality: N/A;  ? CARDIOVERSION N/A 02/18/2020  ? Procedure: CARDIOVERSION;  Surgeon: Elouise Munroe, MD;  Location: [facility] ENDOSCOPY;  Service: Cardiovascular;  Laterality: N/A;  ? CATARACT EXTRACTION W/ INTRAOCULAR LENS  IMPLANT, BILATERAL Bilateral 01/15/2017- 03/2017  ? CHOLECYSTECTOMY    ? CORONARY ANGIOPLASTY  X 2  ? CORONARY ANGIOPLASTY WITH STENT PLACEMENT  1998; ~ 2007; ?date  ? "1 stent; replaced stent; not sure when I got the last stent" (04/23/2017)  ? DOPPLER ECHOCARDIOGRAPHY  2009  ? ELECTROPHYSIOLOGIC STUDY N/A 03/31/2016  ? Procedure: Cardioversion;  Surgeon: Evans Lance, MD;  Location: [place] CV LAB;  Service: Cardiovascular;  Laterality: N/A;  ? ELECTROPHYSIOLOGIC STUDY N/A 08/04/2016  ? Procedure: Cardioversion;  Surgeon: Evans Lance, MD;  Location: [place] CV LAB;  Service: Cardiovascular;  Laterality: N/A;  ? INSERT / REPLACE / REMOVE PACEMAKER  06/2007  ? IR RADIOLOGY PERIPHERAL GUIDED IV START  01/31/2018  ? IR US GUIDE VASC ACCESS LEFT  01/31/2018  ? JOINT REPLACEMENT    ? LAPAROSCOPIC CHOLECYSTECTOMY  1994  ? LEFT AND RIGHT HEART  CATHETERIZATION WITH CORONARY ANGIOGRAM N/A 10/06/2013  ? Procedure: LEFT AND RIGHT HEART CATHETERIZATION WITH CORONARY ANGIOGRAM;  Surgeon: Troy Sine, MD;  Location: [facility] CATH LAB;  Service: Cardiovascular;  Laterality: N/A;  ? LEFT OOPHORECTOMY Left ~ 1989  ? POSTERIOR LUMBAR FUSION  2000s - 04/2015 X 3  ? L3-4; L4-5; L2-3; Dr Trenton Gammon  ? RIGHT/LEFT HEART CATH AND CORONARY ANGIOGRAPHY N/A 05/09/2017  ? Procedure: RIGHT/LEFT HEART CATH AND CORONARY ANGIOGRAPHY;  Surgeon: Sherren Mocha, MD;  Location: [place] CV LAB;  Service: Cardiovascular;  Laterality: N/A;  ? SKIN CANCER EXCISION Right   ? [place]  ? TEE WITHOUT CARDIOVERSION N/A 09/29/2016  ? Procedure: TRANSESOPHAGEAL ECHOCARDIOGRAM (TEE);  Surgeon: Jerline Pain, MD;  Location: [facility] ENDOSCOPY;  Service: Cardiovascular;  Laterality: N/A;  ? TOTAL ABDOMINAL HYSTERECTOMY  1984  ? "uterus & right ovary"  ? TOTAL KNEE ARTHROPLASTY Left 04/23/2017  ? TOTAL KNEE ARTHROPLASTY Left 04/23/2017  ? Procedure: TOTAL KNEE ARTHROPLASTY;  Surgeon: Frederik Pear, MD;  Location: [place];  Service: Orthopedics;  Laterality: Left;  ? ULTRASOUND GUIDANCE FOR VASCULAR ACCESS  05/09/2017  ? Procedure: Ultrasound Guidance For Vascular Access;  Surgeon: Sherren Mocha, MD;  Location: [place] CV LAB;  Service: Cardiovascular;;  ? ? ?Current Outpatient Medications  ?Medication Sig Dispense Refill  ? apixaban (ELIQUIS) 5 MG TABS tablet Take 1 tablet (5 mg total) by mouth 2 (two) times daily. 180 tablet 1  ? Ascorbic Acid (VITAMIN C) 1000 MG tablet Take 1,000 mg by mouth at bedtime.     ? atorvastatin (LIPITOR) 40 MG tablet Take 1 tablet (40 mg total) by mouth daily. 90 tablet 1  ? Calcium Carbonate-Vitamin D (CALCIUM 600+D PO) Take 1 tablet by mouth daily.     ? diltiazem (CARDIZEM LA) 360 MG 24 hr tablet Take 1 tablet (360 mg total) by mouth daily. 90 tablet 1  ? dofetilide (TIKOSYN) 250 MCG capsule TAKE ONE CAPSULE BY MOUTH TWICE A DAY 180 capsule 3  ? DULoxetine (CYMBALTA) 60 MG  capsule TAKE 1 CAPSULE BY MOUTH EVERY DAY 90 capsule 0  ? fluticasone (FLONASE) 50 MCG/ACT nasal spray Place 1 spray into both nostrils at bedtime.    ? JARDIANCE 25 MG TABS tablet TAKE 1 TABLET BY MOUTH DAILY 30

## 2021-10-06 ENCOUNTER — Ambulatory Visit: Payer: Medicare Other | Admitting: Physician Assistant

## 2021-10-06 ENCOUNTER — Encounter: Payer: Self-pay | Admitting: Physician Assistant

## 2021-10-06 VITALS — BP 132/62 | HR 61 | Ht 66.5 in | Wt 244.0 lb

## 2021-10-06 DIAGNOSIS — Z5181 Encounter for therapeutic drug level monitoring: Secondary | ICD-10-CM | POA: Diagnosis not present

## 2021-10-06 DIAGNOSIS — I2510 Atherosclerotic heart disease of native coronary artery without angina pectoris: Secondary | ICD-10-CM | POA: Diagnosis not present

## 2021-10-06 DIAGNOSIS — I480 Paroxysmal atrial fibrillation: Secondary | ICD-10-CM | POA: Diagnosis not present

## 2021-10-06 DIAGNOSIS — Z79899 Other long term (current) drug therapy: Secondary | ICD-10-CM | POA: Diagnosis not present

## 2021-10-06 DIAGNOSIS — I5032 Chronic diastolic (congestive) heart failure: Secondary | ICD-10-CM | POA: Diagnosis not present

## 2021-10-06 DIAGNOSIS — Z950 Presence of cardiac pacemaker: Secondary | ICD-10-CM | POA: Diagnosis not present

## 2021-10-06 LAB — CUP PACEART INCLINIC DEVICE CHECK
Battery Impedance: 4600 Ohm
Battery Voltage: 2.79 V
Date Time Interrogation Session: 20230406172038
Implantable Lead Implant Date: 20081211
Implantable Lead Implant Date: 20081211
Implantable Lead Location: 753859
Implantable Lead Location: 753860
Implantable Pulse Generator Implant Date: 20081211
Lead Channel Impedance Value: 495 Ohm
Lead Channel Impedance Value: 524 Ohm
Lead Channel Pacing Threshold Amplitude: 1 V
Lead Channel Pacing Threshold Amplitude: 1.25 V
Lead Channel Pacing Threshold Pulse Width: 0.5 ms
Lead Channel Pacing Threshold Pulse Width: 0.7 ms
Lead Channel Setting Pacing Amplitude: 2 V
Lead Channel Setting Pacing Amplitude: 3.5 V
Lead Channel Setting Pacing Pulse Width: 0.7 ms
Lead Channel Setting Sensing Sensitivity: 2.5 mV
Pulse Gen Model: 5826
Pulse Gen Serial Number: 1999089

## 2021-10-06 MED ORDER — METOPROLOL TARTRATE 50 MG PO TABS: 150.0000 mg | ORAL_TABLET | Freq: Every day | ORAL | 2 refills | Status: DC

## 2021-10-06 NOTE — Patient Instructions (Signed)
Medication Instructions:  ? ? ?START TAKING:  LOPRESSOR 50 MG IN THE AM  AND 100 MG IN THE PM  ? ? ? ?*If you need a refill on your cardiac medications before your next appointment, please call your pharmacy* ? ? ?Lab Work: BMET CBC AND MAG TODAY  ? ? ?If you have labs (blood work) drawn today and your tests are completely normal, you will receive your results only by: ?MyChart Message (if you have MyChart) OR ?A paper copy in the mail ?If you have any lab test that is abnormal or we need to change your treatment, we will call you to review the results. ? ? ?Testing/Procedures: NONE ORDERED  TODAY ? ? ? ?Follow-Up: ?At [facility], you and your health needs are our priority.  As part of our continuing mission to provide you with exceptional heart care, we have created designated Provider Care Teams.  These Care Teams include your primary Cardiologist (physician) and Advanced Practice Providers (APPs -  Physician Assistants and Nurse Practitioners) who all work together to provide you with the care you need, when you need it. ? ?We recommend signing up for the patient portal called "MyChart".  Sign up information is provided on this After Visit Summary.  MyChart is used to connect with patients for Virtual Visits (Telemedicine).  Patients are able to view lab/test results, encounter notes, upcoming appointments, etc.  Non-urgent messages can be sent to your provider as well.   ?To learn more about what you can do with MyChart, go to [email or web address].   ? ?Your next appointment:   ?6 month(s) ? ?The format for your next appointment:   ?In Person ? ?Provider:   ?Tommye Standard, PA-C  ? ? ?Other Instructions ? ?

## 2021-10-13 ENCOUNTER — Other Ambulatory Visit: Payer: Self-pay

## 2021-10-13 ENCOUNTER — Telehealth: Payer: Self-pay | Admitting: Pharmacist

## 2021-10-13 MED ORDER — METFORMIN HCL 1000 MG PO TABS: 1000.0000 mg | ORAL_TABLET | Freq: Two times a day (BID) | ORAL | 1 refills | Status: DC

## 2021-10-13 NOTE — Telephone Encounter (Signed)
Rx sent to pharmacy   

## 2021-10-13 NOTE — Telephone Encounter (Signed)
-----   Message from [name], MD sent at 10/12/2021  5:55 PM EDT ----- ?Regarding: metformin ?Im ok with  changing to metformin '1000mg'$  xr bid   . Can you get  my cma to send in 90 days refill x 1  is  in to her pharmacy if  ok with her  ?----- Message ----- ?From: Viona Gilmore, [facility] ?Sent: 10/03/2021  12:40 PM EDT ?To: [name], MD ? ?Hi, ? ?When you get back can we try to switch her to the XR of metformin? She is still having nausea and some diarrhea and I was hoping this would help some. I think she would prefer to take 1 tablet so can you switch her to the XR 1000 mg tablets BID? ? ?Let me know and I will call her back! ?Maddie ? ?

## 2021-10-14 ENCOUNTER — Other Ambulatory Visit: Payer: Self-pay

## 2021-10-14 DIAGNOSIS — E1165 Type 2 diabetes mellitus with hyperglycemia: Secondary | ICD-10-CM

## 2021-10-14 MED ORDER — METFORMIN HCL ER 500 MG PO TB24: 1000.0000 mg | ORAL_TABLET | Freq: Two times a day (BID) | ORAL | Status: DC

## 2021-10-14 NOTE — Telephone Encounter (Signed)
Called pharmacy to deactivate the metformin IR prescription and informed them to fill just the XR tablets.  ? ?Called patient to make her aware of the change. Left a voicemail with detailed instructions. Plan to follow up in 1 month to see how she tolerates the change. ?

## 2021-10-17 ENCOUNTER — Other Ambulatory Visit: Payer: Self-pay | Admitting: *Deleted

## 2021-10-17 DIAGNOSIS — Z79899 Other long term (current) drug therapy: Secondary | ICD-10-CM

## 2021-10-19 ENCOUNTER — Other Ambulatory Visit: Payer: Medicare Other

## 2021-10-19 DIAGNOSIS — Z79899 Other long term (current) drug therapy: Secondary | ICD-10-CM

## 2021-10-19 LAB — BASIC METABOLIC PANEL
BUN/Creatinine Ratio: 22 (ref 12–28)
BUN/Creatinine Ratio: 27 (ref 12–28)
BUN: 32 mg/dL — ABNORMAL HIGH (ref 8–27)
BUN: 31 mg/dL — ABNORMAL HIGH (ref 8–27)
CO2: 26 mmol/L (ref 20–29)
Calcium: 11 mg/dL — ABNORMAL HIGH (ref 8.7–10.3)
Calcium: 9.8 mg/dL (ref 8.7–10.3)
Chloride: 89 mmol/L — ABNORMAL LOW (ref 96–106)
Chloride: 101 mmol/L (ref 96–106)
Creatinine, Ser: 1.46 mg/dL — ABNORMAL HIGH (ref 0.57–1.00)
Creatinine, Ser: 1.15 mg/dL — ABNORMAL HIGH (ref 0.57–1.00)
Glucose: 147 mg/dL — ABNORMAL HIGH (ref 70–99)
Glucose: 183 mg/dL — ABNORMAL HIGH (ref 70–99)
Potassium: 4.1 mmol/L (ref 3.5–5.2)
Sodium: 138 mmol/L (ref 134–144)
Sodium: 140 mmol/L (ref 134–144)
eGFR: 37 mL/min/{1.73_m2} — ABNORMAL LOW (ref >59)
eGFR: 50 mL/min/{1.73_m2} — ABNORMAL LOW (ref >59)

## 2021-10-19 LAB — CBC
Hematocrit: 46.1 % (ref 34.0–46.6)
Hemoglobin: 16.4 g/dL — ABNORMAL HIGH (ref 11.1–15.9)
MCH: 32.2 pg (ref 26.6–33.0)
MCHC: 35.6 g/dL (ref 31.5–35.7)
MCV: 91 fL (ref 79–97)
Platelets: 340 10*3/uL (ref 150–450)
RBC: 5.09 x10E6/uL (ref 3.77–5.28)
RDW: 12.7 % (ref 11.7–15.4)
WBC: 11.4 10*3/uL — ABNORMAL HIGH (ref 3.4–10.8)

## 2021-10-19 LAB — MAGNESIUM: Magnesium: 2.3 mg/dL (ref 1.6–2.3)

## 2021-10-24 ENCOUNTER — Telehealth: Payer: Self-pay | Admitting: *Deleted

## 2021-10-24 NOTE — Telephone Encounter (Signed)
Spoke with patient aware of results and verbalized understanding ?

## 2021-10-24 NOTE — Telephone Encounter (Signed)
-----   Message from [place], [place] sent at 10/19/2021  5:40 PM EDT ----- ?Looks good for her Tikosyn.  BS was elevated even for a non-fasting sample, follow up with her PMD or managing doctor  for her diabetes. ?

## 2021-10-30 DIAGNOSIS — E1165 Type 2 diabetes mellitus with hyperglycemia: Secondary | ICD-10-CM

## 2021-11-01 DIAGNOSIS — G4733 Obstructive sleep apnea (adult) (pediatric): Secondary | ICD-10-CM | POA: Diagnosis not present

## 2021-11-10 ENCOUNTER — Other Ambulatory Visit: Payer: Self-pay | Admitting: Cardiology

## 2021-11-30 ENCOUNTER — Telehealth: Payer: Self-pay | Admitting: Pharmacist

## 2021-11-30 NOTE — Telephone Encounter (Signed)
Called patient to check back in on tolerability of metformin ER tablets as she has been on them for >1 month. Patient reports she is feeling much better with this formulation and would like to continue taking. Her blood sugars are also lower with the change. She is also overdue to see her PCP to recheck her A1c. Scheduled her for June and a follow up call for CCM in 3 months.

## 2021-12-09 DIAGNOSIS — R0602 Shortness of breath: Secondary | ICD-10-CM | POA: Diagnosis not present

## 2021-12-09 DIAGNOSIS — R6889 Other general symptoms and signs: Secondary | ICD-10-CM | POA: Diagnosis not present

## 2021-12-09 DIAGNOSIS — Z743 Need for continuous supervision: Secondary | ICD-10-CM | POA: Diagnosis not present

## 2021-12-09 DIAGNOSIS — R231 Pallor: Secondary | ICD-10-CM | POA: Diagnosis not present

## 2021-12-09 DIAGNOSIS — R0689 Other abnormalities of breathing: Secondary | ICD-10-CM | POA: Diagnosis not present

## 2021-12-10 ENCOUNTER — Inpatient Hospital Stay (HOSPITAL_COMMUNITY): Payer: Medicare Other

## 2021-12-10 ENCOUNTER — Emergency Department (HOSPITAL_COMMUNITY): Payer: Medicare Other

## 2021-12-10 ENCOUNTER — Inpatient Hospital Stay (HOSPITAL_COMMUNITY)
Admission: EM | Admit: 2021-12-10 | Discharge: 2021-12-16 | DRG: 291 | Disposition: A | Discharge status: Home / Self Care | Payer: Medicare Other | Attending: Internal Medicine | Admitting: Internal Medicine

## 2021-12-10 DIAGNOSIS — F32A Depression, unspecified: Secondary | ICD-10-CM | POA: Diagnosis not present

## 2021-12-10 DIAGNOSIS — Z8249 Family history of ischemic heart disease and other diseases of the circulatory system: Secondary | ICD-10-CM

## 2021-12-10 DIAGNOSIS — K219 Gastro-esophageal reflux disease without esophagitis: Secondary | ICD-10-CM | POA: Diagnosis present

## 2021-12-10 DIAGNOSIS — I5033 Acute on chronic diastolic (congestive) heart failure: Secondary | ICD-10-CM | POA: Diagnosis present

## 2021-12-10 DIAGNOSIS — E669 Obesity, unspecified: Secondary | ICD-10-CM

## 2021-12-10 DIAGNOSIS — G4733 Obstructive sleep apnea (adult) (pediatric): Secondary | ICD-10-CM | POA: Diagnosis present

## 2021-12-10 DIAGNOSIS — Z818 Family history of other mental and behavioral disorders: Secondary | ICD-10-CM

## 2021-12-10 DIAGNOSIS — I2510 Atherosclerotic heart disease of native coronary artery without angina pectoris: Secondary | ICD-10-CM | POA: Diagnosis not present

## 2021-12-10 DIAGNOSIS — Z885 Allergy status to narcotic agent status: Secondary | ICD-10-CM

## 2021-12-10 DIAGNOSIS — I480 Paroxysmal atrial fibrillation: Secondary | ICD-10-CM | POA: Diagnosis present

## 2021-12-10 DIAGNOSIS — Z9861 Coronary angioplasty status: Secondary | ICD-10-CM | POA: Diagnosis not present

## 2021-12-10 DIAGNOSIS — E662 Morbid (severe) obesity with alveolar hypoventilation: Secondary | ICD-10-CM | POA: Diagnosis present

## 2021-12-10 DIAGNOSIS — I110 Hypertensive heart disease with heart failure: Secondary | ICD-10-CM | POA: Diagnosis not present

## 2021-12-10 DIAGNOSIS — E876 Hypokalemia: Secondary | ICD-10-CM | POA: Diagnosis not present

## 2021-12-10 DIAGNOSIS — J84112 Idiopathic pulmonary fibrosis: Secondary | ICD-10-CM | POA: Diagnosis not present

## 2021-12-10 DIAGNOSIS — J9601 Acute respiratory failure with hypoxia: Secondary | ICD-10-CM | POA: Diagnosis not present

## 2021-12-10 DIAGNOSIS — E1169 Type 2 diabetes mellitus with other specified complication: Secondary | ICD-10-CM | POA: Diagnosis present

## 2021-12-10 DIAGNOSIS — Z85828 Personal history of other malignant neoplasm of skin: Secondary | ICD-10-CM

## 2021-12-10 DIAGNOSIS — I272 Pulmonary hypertension, unspecified: Secondary | ICD-10-CM | POA: Diagnosis not present

## 2021-12-10 DIAGNOSIS — R7401 Elevation of levels of liver transaminase levels: Secondary | ICD-10-CM | POA: Diagnosis present

## 2021-12-10 DIAGNOSIS — Z90710 Acquired absence of both cervix and uterus: Secondary | ICD-10-CM

## 2021-12-10 DIAGNOSIS — I4819 Other persistent atrial fibrillation: Secondary | ICD-10-CM | POA: Diagnosis present

## 2021-12-10 DIAGNOSIS — I130 Hypertensive heart and chronic kidney disease with heart failure and stage 1 through stage 4 chronic kidney disease, or unspecified chronic kidney disease: Principal | ICD-10-CM | POA: Diagnosis present

## 2021-12-10 DIAGNOSIS — I495 Sick sinus syndrome: Secondary | ICD-10-CM | POA: Diagnosis present

## 2021-12-10 DIAGNOSIS — R7989 Other specified abnormal findings of blood chemistry: Secondary | ICD-10-CM | POA: Diagnosis present

## 2021-12-10 DIAGNOSIS — Z9841 Cataract extraction status, right eye: Secondary | ICD-10-CM

## 2021-12-10 DIAGNOSIS — I10 Essential (primary) hypertension: Secondary | ICD-10-CM | POA: Diagnosis present

## 2021-12-10 DIAGNOSIS — Z9842 Cataract extraction status, left eye: Secondary | ICD-10-CM

## 2021-12-10 DIAGNOSIS — U099 Post covid-19 condition, unspecified: Secondary | ICD-10-CM | POA: Diagnosis present

## 2021-12-10 DIAGNOSIS — Z981 Arthrodesis status: Secondary | ICD-10-CM

## 2021-12-10 DIAGNOSIS — E875 Hyperkalemia: Secondary | ICD-10-CM | POA: Diagnosis present

## 2021-12-10 DIAGNOSIS — Z79899 Other long term (current) drug therapy: Secondary | ICD-10-CM

## 2021-12-10 DIAGNOSIS — Z955 Presence of coronary angioplasty implant and graft: Secondary | ICD-10-CM

## 2021-12-10 DIAGNOSIS — I509 Heart failure, unspecified: Secondary | ICD-10-CM | POA: Diagnosis not present

## 2021-12-10 DIAGNOSIS — E785 Hyperlipidemia, unspecified: Secondary | ICD-10-CM

## 2021-12-10 DIAGNOSIS — Z6839 Body mass index (BMI) 39.0-39.9, adult: Secondary | ICD-10-CM | POA: Diagnosis not present

## 2021-12-10 DIAGNOSIS — Z7984 Long term (current) use of oral hypoglycemic drugs: Secondary | ICD-10-CM

## 2021-12-10 DIAGNOSIS — Z20822 Contact with and (suspected) exposure to covid-19: Secondary | ICD-10-CM | POA: Diagnosis not present

## 2021-12-10 DIAGNOSIS — Z8673 Personal history of transient ischemic attack (TIA), and cerebral infarction without residual deficits: Secondary | ICD-10-CM

## 2021-12-10 DIAGNOSIS — R6510 Systemic inflammatory response syndrome (SIRS) of non-infectious origin without acute organ dysfunction: Secondary | ICD-10-CM | POA: Diagnosis present

## 2021-12-10 DIAGNOSIS — Z950 Presence of cardiac pacemaker: Secondary | ICD-10-CM | POA: Diagnosis not present

## 2021-12-10 DIAGNOSIS — I700 Atherosclerosis of aorta: Secondary | ICD-10-CM | POA: Diagnosis not present

## 2021-12-10 DIAGNOSIS — F419 Anxiety disorder, unspecified: Secondary | ICD-10-CM | POA: Diagnosis present

## 2021-12-10 DIAGNOSIS — Z87891 Personal history of nicotine dependence: Secondary | ICD-10-CM

## 2021-12-10 DIAGNOSIS — Z961 Presence of intraocular lens: Secondary | ICD-10-CM | POA: Diagnosis present

## 2021-12-10 DIAGNOSIS — N1832 Chronic kidney disease, stage 3b: Secondary | ICD-10-CM | POA: Diagnosis present

## 2021-12-10 DIAGNOSIS — J849 Interstitial pulmonary disease, unspecified: Secondary | ICD-10-CM

## 2021-12-10 DIAGNOSIS — R590 Localized enlarged lymph nodes: Secondary | ICD-10-CM | POA: Diagnosis not present

## 2021-12-10 DIAGNOSIS — I440 Atrioventricular block, first degree: Secondary | ICD-10-CM | POA: Diagnosis present

## 2021-12-10 DIAGNOSIS — Z7901 Long term (current) use of anticoagulants: Secondary | ICD-10-CM | POA: Diagnosis not present

## 2021-12-10 DIAGNOSIS — Z90721 Acquired absence of ovaries, unilateral: Secondary | ICD-10-CM

## 2021-12-10 DIAGNOSIS — Z833 Family history of diabetes mellitus: Secondary | ICD-10-CM

## 2021-12-10 DIAGNOSIS — R918 Other nonspecific abnormal finding of lung field: Secondary | ICD-10-CM | POA: Diagnosis not present

## 2021-12-10 DIAGNOSIS — I252 Old myocardial infarction: Secondary | ICD-10-CM

## 2021-12-10 DIAGNOSIS — J8410 Pulmonary fibrosis, unspecified: Secondary | ICD-10-CM

## 2021-12-10 DIAGNOSIS — E1122 Type 2 diabetes mellitus with diabetic chronic kidney disease: Secondary | ICD-10-CM | POA: Diagnosis present

## 2021-12-10 DIAGNOSIS — Z9049 Acquired absence of other specified parts of digestive tract: Secondary | ICD-10-CM

## 2021-12-10 DIAGNOSIS — R0602 Shortness of breath: Secondary | ICD-10-CM | POA: Diagnosis not present

## 2021-12-10 DIAGNOSIS — Z96652 Presence of left artificial knee joint: Secondary | ICD-10-CM | POA: Diagnosis present

## 2021-12-10 DIAGNOSIS — Z800 Family history of malignant neoplasm of digestive organs: Secondary | ICD-10-CM

## 2021-12-10 DIAGNOSIS — R0603 Acute respiratory distress: Secondary | ICD-10-CM

## 2021-12-10 DIAGNOSIS — E66812 Obesity, class 2: Secondary | ICD-10-CM

## 2021-12-10 LAB — COMPREHENSIVE METABOLIC PANEL
ALT: 65 U/L — ABNORMAL HIGH (ref 0–44)
AST: 73 U/L — ABNORMAL HIGH (ref 15–41)
Albumin: 3.6 g/dL (ref 3.5–5.0)
Alkaline Phosphatase: 88 U/L (ref 38–126)
Anion gap: 13 (ref 5–15)
BUN: 31 mg/dL — ABNORMAL HIGH (ref 8–23)
CO2: 26 mmol/L (ref 22–32)
Calcium: 9.4 mg/dL (ref 8.9–10.3)
Chloride: 100 mmol/L (ref 98–111)
Creatinine, Ser: 1.35 mg/dL — ABNORMAL HIGH (ref 0.44–1.00)
GFR, Estimated: 41 mL/min — ABNORMAL LOW (ref >60)
Glucose, Bld: 203 mg/dL — ABNORMAL HIGH (ref 70–99)
Potassium: 5.3 mmol/L — ABNORMAL HIGH (ref 3.5–5.1)
Sodium: 139 mmol/L (ref 135–145)
Total Bilirubin: 1.1 mg/dL (ref 0.3–1.2)
Total Protein: 6.9 g/dL (ref 6.5–8.1)

## 2021-12-10 LAB — GLUCOSE, CAPILLARY: Glucose-Capillary: 170 mg/dL — ABNORMAL HIGH (ref 70–99)

## 2021-12-10 LAB — CBC
HCT: 44.7 % (ref 36.0–46.0)
Hemoglobin: 14.4 g/dL (ref 12.0–15.0)
MCH: 31.2 pg (ref 26.0–34.0)
MCHC: 32.2 g/dL (ref 30.0–36.0)
MCV: 96.8 fL (ref 80.0–100.0)
Platelets: 274 10*3/uL (ref 150–400)
RBC: 4.62 MIL/uL (ref 3.87–5.11)
RDW: 14.5 % (ref 11.5–15.5)
WBC: 15 10*3/uL — ABNORMAL HIGH (ref 4.0–10.5)
nRBC: 0 % (ref 0.0–0.2)

## 2021-12-10 LAB — CBG MONITORING, ED
Glucose-Capillary: 193 mg/dL — ABNORMAL HIGH (ref 70–99)
Glucose-Capillary: 173 mg/dL — ABNORMAL HIGH (ref 70–99)

## 2021-12-10 LAB — ECHOCARDIOGRAM COMPLETE
AR max vel: 2.39 cm2
AV Peak grad: 12.3 mmHg
Ao pk vel: 1.76 m/s
Height: 66 in
S' Lateral: 2.7 cm
Weight: 3904.79 oz

## 2021-12-10 LAB — TROPONIN I (HIGH SENSITIVITY): Troponin I (High Sensitivity): 4 ng/L (ref <18)

## 2021-12-10 LAB — BRAIN NATRIURETIC PEPTIDE: B Natriuretic Peptide: 122.2 pg/mL — ABNORMAL HIGH (ref 0.0–100.0)

## 2021-12-10 MED ORDER — DOFETILIDE 250 MCG PO CAPS
250.0000 ug | ORAL_CAPSULE | Freq: Two times a day (BID) | ORAL | Status: DC
Start: 2021-12-10 — End: 2021-12-16
  Administered 2021-12-10 – 2021-12-16 (×13): 250 ug via ORAL
  Filled 2021-12-10 (×14): qty 1

## 2021-12-10 MED ORDER — ACETAMINOPHEN 325 MG PO TABS: 650.0000 mg | ORAL_TABLET | Freq: Four times a day (QID) | ORAL | Status: DC | PRN

## 2021-12-10 MED ORDER — APIXABAN 5 MG PO TABS
5.0000 mg | ORAL_TABLET | Freq: Two times a day (BID) | ORAL | Status: DC
  Administered 2021-12-10 – 2021-12-16 (×13): 5 mg via ORAL
  Filled 2021-12-10 (×13): qty 1

## 2021-12-10 MED ORDER — ORAL CARE MOUTH RINSE
15.0000 mL | Freq: Two times a day (BID) | OROMUCOSAL | Status: DC
  Administered 2021-12-11 – 2021-12-15 (×7): 15 mL via OROMUCOSAL

## 2021-12-10 MED ORDER — ALBUTEROL SULFATE (2.5 MG/3ML) 0.083% IN NEBU
2.5000 mg | INHALATION_SOLUTION | RESPIRATORY_TRACT | Status: DC | PRN
Start: 2021-12-10 — End: 2021-12-16

## 2021-12-10 MED ORDER — POLYVINYL ALCOHOL 1.4 % OP SOLN
1.0000 [drp] | Freq: Every day | OPHTHALMIC | Status: DC
  Administered 2021-12-10 – 2021-12-15 (×6): 1 [drp] via OPHTHALMIC
  Filled 2021-12-10: qty 15

## 2021-12-10 MED ORDER — ACETAMINOPHEN 650 MG RE SUPP: 650.0000 mg | Freq: Four times a day (QID) | RECTAL | Status: DC | PRN

## 2021-12-10 MED ORDER — INSULIN ASPART 100 UNIT/ML IJ SOLN
0.0000 [IU] | Freq: Three times a day (TID) | INTRAMUSCULAR | Status: DC
  Administered 2021-12-11: 5 [IU] via SUBCUTANEOUS
  Administered 2021-12-11: 3 [IU] via SUBCUTANEOUS
  Administered 2021-12-11: 2 [IU] via SUBCUTANEOUS
  Administered 2021-12-12: 3 [IU] via SUBCUTANEOUS
  Administered 2021-12-12: 2 [IU] via SUBCUTANEOUS
  Administered 2021-12-13 (×2): 3 [IU] via SUBCUTANEOUS
  Administered 2021-12-13: 2 [IU] via SUBCUTANEOUS
  Administered 2021-12-14: 3 [IU] via SUBCUTANEOUS
  Administered 2021-12-14: 2 [IU] via SUBCUTANEOUS
  Administered 2021-12-14 – 2021-12-15 (×3): 3 [IU] via SUBCUTANEOUS
  Administered 2021-12-15: 2 [IU] via SUBCUTANEOUS
  Administered 2021-12-16: 3 [IU] via SUBCUTANEOUS

## 2021-12-10 MED ORDER — SODIUM CHLORIDE 0.9% FLUSH
3.0000 mL | Freq: Two times a day (BID) | INTRAVENOUS | Status: DC
  Administered 2021-12-10 – 2021-12-16 (×13): 3 mL via INTRAVENOUS

## 2021-12-10 MED ORDER — ATORVASTATIN CALCIUM 40 MG PO TABS
40.0000 mg | ORAL_TABLET | Freq: Every day | ORAL | Status: DC
  Administered 2021-12-10 – 2021-12-16 (×7): 40 mg via ORAL
  Filled 2021-12-10 (×7): qty 1

## 2021-12-10 MED ORDER — FUROSEMIDE 10 MG/ML IJ SOLN
60.0000 mg | Freq: Two times a day (BID) | INTRAMUSCULAR | Status: DC
  Administered 2021-12-10 – 2021-12-11 (×4): 60 mg via INTRAVENOUS
  Filled 2021-12-10 (×5): qty 6

## 2021-12-10 MED ORDER — METOPROLOL TARTRATE 25 MG PO TABS: 50.0000 mg | ORAL_TABLET | ORAL | Status: DC

## 2021-12-10 MED ORDER — DILTIAZEM HCL ER COATED BEADS 360 MG PO CP24
360.0000 mg | ORAL_CAPSULE | Freq: Every day | ORAL | Status: DC
  Administered 2021-12-10 – 2021-12-16 (×7): 360 mg via ORAL
  Filled 2021-12-10 (×7): qty 1

## 2021-12-10 MED ORDER — CHLORHEXIDINE GLUCONATE 0.12 % MT SOLN
15.0000 mL | Freq: Two times a day (BID) | OROMUCOSAL | Status: DC
  Administered 2021-12-12 – 2021-12-15 (×7): 15 mL via OROMUCOSAL
  Filled 2021-12-10 (×7): qty 15

## 2021-12-10 MED ORDER — EMPAGLIFLOZIN 25 MG PO TABS
25.0000 mg | ORAL_TABLET | Freq: Every day | ORAL | Status: DC
  Administered 2021-12-10 – 2021-12-16 (×7): 25 mg via ORAL
  Filled 2021-12-10 (×7): qty 1

## 2021-12-10 MED ORDER — METOPROLOL TARTRATE 50 MG PO TABS
50.0000 mg | ORAL_TABLET | Freq: Every morning | ORAL | Status: DC
  Administered 2021-12-11 – 2021-12-16 (×6): 50 mg via ORAL
  Filled 2021-12-10 (×6): qty 1

## 2021-12-10 MED ORDER — PANTOPRAZOLE SODIUM 40 MG PO TBEC
40.0000 mg | DELAYED_RELEASE_TABLET | Freq: Two times a day (BID) | ORAL | Status: DC
  Administered 2021-12-10 – 2021-12-16 (×13): 40 mg via ORAL
  Filled 2021-12-10 (×13): qty 1

## 2021-12-10 MED ORDER — FLUTICASONE PROPIONATE 50 MCG/ACT NA SUSP
1.0000 | Freq: Every day | NASAL | Status: DC | PRN
Start: 2021-12-10 — End: 2021-12-16

## 2021-12-10 MED ORDER — FUROSEMIDE 10 MG/ML IJ SOLN
60.0000 mg | Freq: Once | INTRAMUSCULAR | Status: AC
  Administered 2021-12-10: 60 mg via INTRAVENOUS
  Filled 2021-12-10: qty 6

## 2021-12-10 MED ORDER — LORATADINE 10 MG PO TABS: 10.0000 mg | ORAL_TABLET | Freq: Every day | ORAL | Status: DC | PRN

## 2021-12-10 MED ORDER — NYSTATIN-TRIAMCINOLONE 100000-0.1 UNIT/GM-% EX CREA
1.0000 | TOPICAL_CREAM | Freq: Two times a day (BID) | CUTANEOUS | Status: DC | PRN
Start: 2021-12-10 — End: 2021-12-16

## 2021-12-10 MED ORDER — DULOXETINE HCL 60 MG PO CPEP
60.0000 mg | ORAL_CAPSULE | Freq: Every day | ORAL | Status: DC
  Administered 2021-12-10 – 2021-12-16 (×7): 60 mg via ORAL
  Filled 2021-12-10 (×7): qty 1

## 2021-12-10 MED ORDER — METOPROLOL TARTRATE 100 MG PO TABS
100.0000 mg | ORAL_TABLET | Freq: Every day | ORAL | Status: DC
  Administered 2021-12-10 – 2021-12-15 (×6): 100 mg via ORAL
  Filled 2021-12-10 (×6): qty 1

## 2021-12-10 NOTE — Consult Note (Addendum)
Cardiology Consultation:   Patient ID: [name] MRN: [id number]; DOB: 10-Apr-1946  Admit date: 12/10/2021 Date of Consult: 12/10/2021  PCP:  [name], MD   [facility] Providers Cardiologist:  Mertie Moores, MD  Electrophysiologist:  Constance Haw, MD  {  Patient Profile:   [name] is a 76 y.o. female with a hx of CAD, HFpEF, OSA on CPAP, SSS s/p PPM, A-fib, CKD stage III, DM2, hypertension, and hyperlipidemia who is being seen 12/10/2021 for the evaluation of dyspnea at the request of Dr. Tamala Julian.  History of Present Illness:   [name] follows with the EP service for persistent A-fib and is s/p A-fib ablation x3 and a history of multiple cardioversions..  She is maintained on dofetilide, metoprolol, Cardizem 360 mg, and anticoagulated with Eliquis.  She failed amiodarone therapy secondary to concern for ILD.  She has a remote history of CAD with BMS to mid RCA in 2004.  Heart catheterization 05/2017 with patent RCA stent and chronic occlusion of a small acute marginal branch.  She was also found to have severe pulmonary hypertension.  She was hospitalized 05/2020 with respiratory failure secondary to RSV and bronchitis.  Cardiology was consulted for A-fib RVR in the setting of infection.  Tikosyn was continued and Lopressor added.  In follow-up clinic, she was in atrial flutter.  She underwent a fourth ablation (atrial flutter) on 07/07/2020 with additional cardioversion to sinus rhythm.  She was last seen in EP clinic for 623 and was doing well at that time with only occasional, 1 beat palpitations.  She presented to [facility] ED 12/10/2021 with dyspnea on exertion and found to be hypoxic.    Work-up so far includes: BNP 122 AST 73 ALT 65 Creatinine 1.35 K 5.3 WBC 15.0 HST 4, delta pending Procalcitonin pending  CXR with mild cardiomegaly and mild vascular congestion.   Shortness of breath and acute respiratory failure felt consistent with CHF exacerbation and she  was diuresed.  Unfortunately following diuresis she was unable to be weaned from O2. Medicine was called to admit and cardiology asked to consult.   During my interview, she states she has been feeling short of breath for about 2 weeks with progressive lower extremity edema that has now extended up to her mid-abdomen. She denies recent illness, fever, chills, cough, congestion. She became acutely short of breath last evening prompting EMS dispatch and presentation to ER. She appears comfortable, but on NRB.   Past Medical History:  Diagnosis Date   Anticoagulant long-term use    pradaxa   Anxiety    Arthritis    "fingers, lower back" (04/23/2017    CAD (coronary artery disease) 5102,5852   post PTCA with bare-metal stenting to mid RCA in December 2004      CHF (congestive heart failure) (HCC)    Chronic atrial fibrillation ([place]) 06/2007   Tachybradycardia pacemaker   Chronic kidney disease    10% function - ?R, other kidney is compensating     CVA (cerebral vascular accident) [facility]) [phone]   denies residual on 04/23/2017   Depression    Diplopia 06/19/2008   Qualifier: Diagnosis of  By: [name] MD, [name]    Dysrhythmia    ATRIAL FIBRILATION   Edema of lower extremity    Hyperlipidemia    Hypertension    Inferior myocardial infarction [facility])    acute inferior wall mi/other medical hx   Myocardial infarction [facility]) [phone]   Obesity    OSA on  CPAP    last test- 2010   Pacemaker    Pneumonia 2014   tx. ----  [facility]   Pulmonary hypertension ([place])    moderate pulmonary hypertension by 10/2016 echo and 10/2013 cardiac cath   Shortness of breath    Skin cancer    "cut off right Lupinski; burned off LLE" (04/23/2017)   Sleep apnea    Spondylolisthesis    TIA (transient ischemic attack) 2008   Unspecified hemorrhoids without mention of complication 9/39/0300   Colonoscopy--Dr. Carlean Purl     Past Surgical History:  Procedure Laterality Date   [place] N/A 09/29/2016   Procedure: Atrial Fibrillation Ablation;  Surgeon: Ashiya Kinkead Meredith Leeds, MD;  Location: [place] CV LAB;  Service: Cardiovascular;  Laterality: N/A;   ATRIAL FIBRILLATION ABLATION N/A 02/07/2018   Procedure: ATRIAL FIBRILLATION ABLATION;  Surgeon: Constance Haw, MD;  Location: [place] CV LAB;  Service: Cardiovascular;  Laterality: N/A;   ATRIAL FIBRILLATION ABLATION N/A 03/13/2019   Procedure: ATRIAL FIBRILLATION ABLATION;  Surgeon: Constance Haw, MD;  Location: [place] CV LAB;  Service: Cardiovascular;  Laterality: N/A;   ATRIAL FIBRILLATION ABLATION N/A 07/07/2020   Procedure: ATRIAL FIBRILLATION ABLATION;  Surgeon: Constance Haw, MD;  Location: [place] CV LAB;  Service: Cardiovascular;  Laterality: N/A;   BACK SURGERY     CARDIOVERSION N/A 09/12/2017   Procedure: CARDIOVERSION;  Surgeon: Jerline Pain, MD;  Location: [facility] ENDOSCOPY;  Service: Cardiovascular;  Laterality: N/A;   CARDIOVERSION N/A 12/13/2017   Procedure: CARDIOVERSION;  Surgeon: Sanda Klein, MD;  Location: [place] ENDOSCOPY;  Service: Cardiovascular;  Laterality: N/A;   CARDIOVERSION N/A 02/18/2018   Procedure: CARDIOVERSION;  Surgeon: Dorothy Spark, MD;  Location: [facility] ENDOSCOPY;  Service: Cardiovascular;  Laterality: N/A;   CARDIOVERSION N/A 05/20/2018   Procedure: CARDIOVERSION;  Surgeon: [name], MD;  Location: [facility] ENDOSCOPY;  Service: Cardiovascular;  Laterality: N/A;   CARDIOVERSION N/A 04/16/2019   Procedure: CARDIOVERSION;  Surgeon: Donato Heinz, MD;  Location: [place];  Service: Endoscopy;  Laterality: N/A;   CARDIOVERSION N/A 01/22/2020   Procedure: CARDIOVERSION;  Surgeon: Geralynn Rile, MD;  Location: [place];  Service: Cardiovascular;  Laterality: N/A;   CARDIOVERSION N/A 02/18/2020   Procedure: CARDIOVERSION;  Surgeon: Elouise Munroe, MD;  Location: [facility] ENDOSCOPY;  Service:  Cardiovascular;  Laterality: N/A;   CATARACT EXTRACTION W/ INTRAOCULAR LENS  IMPLANT, BILATERAL Bilateral 01/15/2017- 03/2017   CHOLECYSTECTOMY     CORONARY ANGIOPLASTY  X 2   CORONARY ANGIOPLASTY WITH [place]; ~ 2007; ?date   "1 stent; replaced stent; not sure when I got the last stent" (04/23/2017)   DOPPLER ECHOCARDIOGRAPHY  2009   ELECTROPHYSIOLOGIC STUDY N/A 03/31/2016   Procedure: Cardioversion;  Surgeon: Evans Lance, MD;  Location: [place] CV LAB;  Service: Cardiovascular;  Laterality: N/A;   ELECTROPHYSIOLOGIC STUDY N/A 08/04/2016   Procedure: Cardioversion;  Surgeon: Evans Lance, MD;  Location: [place] CV LAB;  Service: Cardiovascular;  Laterality: N/A;   INSERT / REPLACE / REMOVE PACEMAKER  06/2007   IR RADIOLOGY PERIPHERAL GUIDED IV START  01/31/2018   IR US GUIDE VASC ACCESS LEFT  01/31/2018   JOINT REPLACEMENT     LAPAROSCOPIC CHOLECYSTECTOMY  1994   LEFT AND RIGHT HEART CATHETERIZATION WITH CORONARY ANGIOGRAM N/A 10/06/2013   Procedure: LEFT AND RIGHT HEART CATHETERIZATION WITH CORONARY ANGIOGRAM;  Surgeon: Marcello Moores  Floyce Stakes, MD;  Location: [facility] CATH LAB;  Service: Cardiovascular;  Laterality: N/A;   LEFT OOPHORECTOMY Left ~ [place]  2000s - 04/2015 X 3   L3-4; L4-5; L2-3; Dr Trenton Gammon   RIGHT/LEFT HEART CATH AND CORONARY ANGIOGRAPHY N/A 05/09/2017   Procedure: RIGHT/LEFT HEART CATH AND CORONARY ANGIOGRAPHY;  Surgeon: Sherren Mocha, MD;  Location: [place] CV LAB;  Service: Cardiovascular;  Laterality: N/A;   SKIN CANCER EXCISION Right    [place]   TEE WITHOUT CARDIOVERSION N/A 09/29/2016   Procedure: TRANSESOPHAGEAL ECHOCARDIOGRAM (TEE);  Surgeon: Jerline Pain, MD;  Location: [facility] ENDOSCOPY;  Service: Cardiovascular;  Laterality: N/A;   TOTAL ABDOMINAL HYSTERECTOMY  1984   "uterus & right ovary"   TOTAL KNEE ARTHROPLASTY Left 04/23/2017   TOTAL KNEE ARTHROPLASTY Left 04/23/2017   Procedure: TOTAL KNEE ARTHROPLASTY;  Surgeon: Frederik Pear,  MD;  Location: [place];  Service: Orthopedics;  Laterality: Left;   ULTRASOUND GUIDANCE FOR VASCULAR ACCESS  05/09/2017   Procedure: Ultrasound Guidance For Vascular Access;  Surgeon: Sherren Mocha, MD;  Location: [place] CV LAB;  Service: Cardiovascular;;     Home Medications:  Prior to Admission medications   Medication Sig Start Date End Date Taking? Authorizing Provider  apixaban (ELIQUIS) 5 MG TABS tablet Take 1 tablet (5 mg total) by mouth 2 (two) times daily. 07/07/21  Yes Alyaan Budzynski, Ocie Doyne, MD  Ascorbic Acid (VITAMIN C) 1000 MG tablet Take 1,000 mg by mouth at bedtime.    Yes [provider]  atorvastatin (LIPITOR) 40 MG tablet TAKE 1 TABLET BY MOUTH DAILY Patient taking differently: Take 40 mg by mouth daily. 11/11/21  Yes Braxtin Bamba, Ocie Doyne, MD  Calcium Carbonate-Vitamin D (CALCIUM 600+D PO) Take 1 tablet by mouth daily.    Yes [provider]  cholecalciferol (VITAMIN D) 25 MCG (1000 UNIT) tablet Take 1,000 Units by mouth at bedtime.   Yes [provider]  diltiazem (CARDIZEM LA) 360 MG 24 hr tablet Take 1 tablet (360 mg total) by mouth daily. 07/07/21  Yes Krystal Delduca Hassell Done, MD  dofetilide (TIKOSYN) 250 MCG capsule TAKE ONE CAPSULE BY MOUTH TWICE A DAY Patient taking differently: Take 250 mcg by mouth 2 (two) times daily. 09/21/21  Yes Alica Shellhammer Hassell Done, MD  DULoxetine (CYMBALTA) 60 MG capsule TAKE 1 CAPSULE BY MOUTH EVERY DAY Patient taking differently: Take 60 mg by mouth daily. 09/09/21  Yes [name], [name], MD  fluticasone (FLONASE) 50 MCG/ACT nasal spray Place 1 spray into both nostrils daily as needed for allergies.   Yes [provider]  JARDIANCE 25 MG TABS tablet TAKE 1 TABLET BY MOUTH DAILY Patient taking differently: Take 25 mg by mouth daily. 07/20/21  Yes [name], [name], MD  loratadine (CLARITIN) 10 MG tablet TAKE 1 TABLET BY MOUTH EVERY DAY Patient taking differently: Take 10 mg by mouth daily as needed for allergies.  04/28/21  Yes [name], [name], MD  Lysine 1000 MG TABS Take 1,000 mg by mouth at bedtime.    Yes [provider]  metFORMIN (GLUCOPHAGE) 1000 MG tablet Take 1 tablet (1,000 mg total) by mouth 2 (two) times daily. 10/13/21  Yes [name], [name], MD  metolazone (ZAROXOLYN) 2.5 MG tablet TAKE 1 TABLET BY MOUTH EVERY 4  DAYS AS NEEDED FOR SWELLING  PLEASE KEEP UPCOMING APPOINTMENT IN JAN, 2023 FOR FURTHER REFILLS Patient taking differently: Take 2.5 mg by mouth See admin instructions. Every 4 days as needed for swelling 08/30/21  Yes Kazzandra Desaulniers Hassell Done, MD  metoprolol tartrate (LOPRESSOR) 50 MG tablet Take 3 tablets (150 mg total) by mouth daily. TAKE 50 MG IN AM TAKE 100 MG IN PM Patient taking differently: Take 50-100 mg by mouth See admin instructions. 50 mg in the morning  100 mg in the evening 10/06/21  Yes Baldwin Jamaica, PA-C  Multiple Vitamin (MULTIVITAMIN WITH MINERALS) TABS tablet Take 1 tablet by mouth daily. Centrum   Yes [provider]  NON FORMULARY CPAP at bedtime   Yes [provider]  nystatin-triamcinolone (MYCOLOG II) cream Apply 1 application topically 2 (two) times daily as needed. Patient taking differently: Apply 1 application  topically 2 (two) times daily as needed (rash). 08/23/21  Yes [name], [name], MD  omeprazole (PRILOSEC) 20 MG capsule Take 20 mg by mouth 2 (two) times daily.   Yes [provider]  Polyethyl Glycol-Propyl Glycol (SYSTANE) 0.4-0.3 % SOLN Place 1 drop into both eyes at bedtime.   Yes [provider]  potassium chloride SA (KLOR-CON M) 20 MEQ tablet TAKE 2 TABLETS BY MOUTH TWICE  DAILY Patient taking differently: Take 40 mEq by mouth 2 (two) times daily. 07/19/21  Yes Gwenetta Devos Hassell Done, MD  torsemide (DEMADEX) 20 MG tablet TAKE 2 TABLETS BY MOUTH TWICE  DAILY Patient taking differently: Take 40 mg by mouth 2 (two) times daily. 08/22/21  Yes Arrin Ishler Hassell Done, MD  valACYclovir (VALTREX) 1000 MG tablet TAKE 2  TABLETS BY MOUTH EVERY 12 HOURS AS NEEDED Patient taking differently: Take 2,000 mg by mouth 2 (two) times daily as needed (fever blisters). 09/07/21  Yes [name], [name], MD  vitamin B-12 (CYANOCOBALAMIN) 1000 MCG tablet Take 1,000 mcg by mouth at bedtime.   Yes [provider]  nitroGLYCERIN (NITROSTAT) 0.4 MG SL tablet Place 1 tablet (0.4 mg total) under the tongue every 5 (five) minutes x 3 doses as needed for chest pain. PLEASE CALL [phone] AND SCHEDULE AN OVERDUE APPT WITH DR Acie Fredrickson FOR FURTHER REFILLS TO BE GRANTED (2ND ATTEMPT) Patient taking differently: Place 0.4 mg under the tongue every 5 (five) minutes x 3 doses as needed for chest pain. 07/05/21   Nahser, Wonda Cheng, MD    Inpatient Medications: Scheduled Meds:  apixaban  5 mg Oral BID   atorvastatin  40 mg Oral Daily   diltiazem  360 mg Oral Daily   dofetilide  250 mcg Oral BID   DULoxetine  60 mg Oral Daily   empagliflozin  25 mg Oral Daily   furosemide  60 mg Intravenous BID   insulin aspart  0-15 Units Subcutaneous TID WC   metFORMIN  1,000 mg Oral BID   metoprolol tartrate  50-100 mg Oral See admin instructions   pantoprazole  40 mg Oral BID   Polyethyl Glycol-Propyl Glycol  1 drop Both Eyes QHS   sodium chloride flush  3 mL Intravenous Q12H   Continuous Infusions:  PRN Meds: acetaminophen **OR** acetaminophen, albuterol, fluticasone, loratadine, nystatin-triamcinolone  Allergies:    Allergies  Allergen Reactions   Hydrocodone-Guaifenesin Other (See Comments)    Confusion/ memory loss   Adhesive [Tape] Rash and Other (See Comments)    Allergic to defibrillation pads.   Pedi-Pre Tape Spray [Wound Dressing Adhesive] Other (See Comments) and Rash    Allergic to defibrillation pads.    Social History:   Social History   Socioeconomic History   Marital status: Married    Spouse name: Not on file   Number of children: 0  Years of education: HS   Highest education level: Not on file  Occupational  History   Occupation: retired    Comment: previously worked [employer]  Tobacco Use   Smoking status: Former    Packs/day: 1.00    Years: 5.00    Total pack years: 5.00    Types: Cigarettes    Start date: 05/11/1978    Quit date: 07/03/1982    Years since quitting: 39.4   Smokeless tobacco: Never  Vaping Use   Vaping Use: Never used  Substance and Sexual Activity   Alcohol use: Yes    Comment: 04/23/2017 "once q 6 months; glass of wine"   Drug use: No   Sexual activity: Not Currently  Other Topics Concern   Not on file  Social History Narrative   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   Married    Originally from [place] of two, high school education   Former smoker 1979-1984 1ppd   Hunting dogs 7    Retired from [employer] 2    Social Determinants of Radio broadcast assistant Strain: [place]  (11/30/2020)   Overall Financial Resource Strain (CARDIA)    Difficulty of Paying Living Expenses: Not hard at all  Food Insecurity: No [place] (11/30/2020)   Hunger Vital Sign    Worried About Running Out of Food in the Last Year: Never true    [place] in the Last Year: Never true  Transportation Needs: No Transportation Needs (11/30/2020)   PRAPARE - Hydrologist (Medical): No    Lack of Transportation (Non-Medical): No  Physical Activity: Unknown (11/30/2020)   Exercise Vital Sign    Days of Exercise per Week: Not on file    Minutes of Exercise per Session: 20 min  Stress: Stress Concern Present (11/30/2020)   [place]    Feeling of Stress : To some extent  Social Connections: Socially Integrated (11/30/2020)   Social Connection and Isolation Panel [NHANES]    Frequency of Communication with Friends and Family: More than three times a week    Frequency of Social Gatherings with Friends and Family: More  than three times a week    Attends Religious Services: 1 to 4 times per year    Active Member of [employer] or Organizations: Yes    Attends Archivist Meetings: 1 to 4 times per year    Marital Status: Married  Human resources officer Violence: Not At Risk (11/30/2020)   Humiliation, Afraid, Rape, and Kick questionnaire    Fear of Current or Ex-Partner: No    Emotionally Abused: No    Physically Abused: No    Sexually Abused: No    Family History:    Family History  Problem Relation Age of Onset   Suicidality Father        suicide death pt was 3 yrs, 1950   Arrhythmia Mother    Hypertension Mother    Diabetes Mother    Dementia Mother    Heart attack Brother    Heart disease Paternal Aunt    Prostate cancer Maternal Grandfather    Diabetes Paternal Grandfather        fathers side of the family   Colon cancer Maternal Aunt      ROS:  Please see the history of present illness.   All other  ROS reviewed and negative.     Physical Exam/Data:   Vitals:   12/10/21 1200 12/10/21 1251 12/10/21 1400 12/10/21 1445  BP: 132/65  (!) 135/57   Pulse: 91  91 88  Resp: (!) 21  (!) 26 (!) 29  Temp:  97.9 F (36.6 C)    TempSrc:  Oral    SpO2: 92%   95%  Weight:      Height:       No intake or output data in the 24 hours ending 12/10/21 1504    12/10/2021   12:17 AM 10/06/2021   11:25 AM 08/23/2021    8:52 AM  Last 3 Weights  Weight (lbs) 244 lb 0.8 oz 244 lb 249 lb 3.2 oz  Weight (kg) 110.7 kg 110.678 kg 113.036 kg     Body mass index is 39.39 kg/m.  General:  Well nourished, well developed, in no acute distress HEENT: normal Neck: minimal JVD Vascular: No carotid bruits; Distal pulses 2+ bilaterally Cardiac:  normal S1, S2; RRR; no murmur  Lungs:  respirations unlabored, wheezing in bases  Abd: soft, nontender, no hepatomegaly  Ext: 1+ B LE edema Musculoskeletal:  No deformities, BUE and BLE strength normal and equal Skin: warm and dry  Neuro:  CNs 2-12 intact, no  focal abnormalities noted Psych:  Normal affect   EKG:  The EKG was personally reviewed and demonstrates:  sinus rhythm HR 60 with first degree heart block Telemetry:  Telemetry was personally reviewed and demonstrates:  sinus rhythm HR 80s with PVCs and episodes of bigeminy, no atrial arrhythmias  Relevant CV Studies:  Echo pending  Echo 05/2020: 1. Left ventricular ejection fraction, by estimation, is 60 to 65%. The  left ventricle has normal function. The left ventricle has no regional  wall motion abnormalities. There is mild left ventricular hypertrophy.  Left ventricular diastolic parameters  are indeterminate.   2. Right ventricular systolic function is normal. The right ventricular  size is normal. Tricuspid regurgitation signal is inadequate for assessing  PA pressure.   3. The mitral valve is abnormal. No evidence of mitral valve  regurgitation. Moderate mitral annular calcification.   4. The aortic valve was not well visualized. Aortic valve regurgitation  is not visualized. No aortic stenosis is present.   5. Agitated saline contrast bubble study was negative, with no evidence  of any interatrial shunt, though poor quality bubble study   Laboratory Data:  High Sensitivity Troponin:   Recent Labs  Lab 12/10/21 0700  TROPONINIHS 4     Chemistry Recent Labs  Lab 12/10/21 0022  NA 139  K 5.3*  CL 100  CO2 26  GLUCOSE 203*  BUN 31*  CREATININE 1.35*  CALCIUM 9.4  GFRNONAA 41*  ANIONGAP 13    Recent Labs  Lab 12/10/21 0022  PROT 6.9  ALBUMIN 3.6  AST 73*  ALT 65*  ALKPHOS 88  BILITOT 1.1   Lipids No results for input(s): "CHOL", "TRIG", "HDL", "LABVLDL", "LDLCALC", "CHOLHDL" in the last 168 hours.  Hematology Recent Labs  Lab 12/10/21 0022  WBC 15.0*  RBC 4.62  HGB 14.4  HCT 44.7  MCV 96.8  MCH 31.2  MCHC 32.2  RDW 14.5  PLT 274   Thyroid No results for input(s): "TSH", "FREET4" in the last 168 hours.  BNP Recent Labs  Lab  12/10/21 0022  BNP 122.2*    DDimer No results for input(s): "DDIMER" in the last 168 hours.   Radiology/Studies:  DG Chest  Port 1 View  Result Date: 12/10/2021 CLINICAL DATA:  Shortness of breath. EXAM: PORTABLE CHEST 1 VIEW COMPARISON:  Chest radiograph dated 05/27/2020. FINDINGS: Mild cardiomegaly with mild vascular congestion. No focal consolidation, pleural effusion, or pneumothorax. Left pectoral pacemaker device. Atherosclerotic calcification of the aortic arch. No acute osseous pathology. IMPRESSION: Mild cardiomegaly with mild vascular congestion. Electronically Signed   By: Anner Crete M.D.   On: 12/10/2021 00:50     Assessment and Plan:   Dyspnea on exertion Acute respiratory failure - given mild vascular congestion, agree with gentle diuresis - has received 60 mg IV lasix x 2 doses - diuresis of at least 1.5 L - I&Os not yet charted - recommend daily weights - would hold further diuresis today - evaluate BMP tomorrow - does not appear extremely volume up on exam - were able to reduce O2 on exam, O2 drifted to 92% on 3L with conversation   Pulmonary hypertension Severe by cath in 2018 - Dustyn Armbrister await echo read - prior question of ILD - may ultimately need a RHC   Persistent atrial fibrillation Paroxysmal atrial flutter PVCs, ventricular bigeminy S/p PVI and CTI ablation in 2019 S/p A-fib ablation 2020 S/p Atrial flutter ablation 2022 EKG today with sinus rhythm with 1st degree heart block Telemetry with PVCs and episodes of bigeminy - continue dofetilide, BB, and cardizem - Cozy Veale need to closely monitor on telemetry   Chronic anticoagulation No bleeding issues on eliquis - conitnue   Elevated LFTs - unclear etiology - do not suspect hepatic congestion   Leukocytosis - unclear etiology for elevated WBC - defer to primary   Wheezing on exam - may benefit from xopenex - would avoid albuterol given complex history of Afib/flutter with RVR   Risk  Assessment/Risk Scores:   New York Heart Association (NYHA) Functional Class NYHA Class IV  CHA2DS2-VASc Score = 7   This indicates a 11.2% annual risk of stroke. The patient's score is based upon: CHF History: 1 HTN History: 1 Diabetes History: 1 Stroke History: 0 Vascular Disease History: 1 Age Score: 2 Gender Score: 1       For questions or updates, please contact [place] Please consult [email or web address] for contact info under    Signed, Ledora Bottcher, PA  12/10/2021 3:04 PM  I have seen and examined this patient with [place].  Agree with above, note added to reflect my findings.  She has a history of coronary artery disease, diastolic heart failure, sleep apnea, atrial fibrillation, sick sinus syndrome status post pacemaker.  She presented emergency room with shortness of breath.  She states that she has been short of breath over the last few weeks, but it is significantly worsened.  In the emergency room, she received 2 doses of IV Lasix with significant urine output.  She continues to be short of breath requiring oxygen with a intermittent nonrebreather.  GEN: Well nourished, well developed, in no acute distress  HEENT: normal  Neck: no JVD, carotid bruits, or masses Cardiac: RRR; no murmurs, rubs, or gallops,no edema  Respiratory:  clear to auscultation bilaterally, normal work of breathing GI: soft, nontender, nondistended, + BS MS: no deformity or atrophy  Skin: warm and dry, device site well healed Neuro:  Strength and sensation are intact Psych: euthymic mood, full affect   Shortness of breath: At this point is unclear as to the cause of her shortness of breath.  She does not have significant evidence of volume overload.  Additionally, her LV  is normal.  Her BMP was not significantly elevated and she does not have lower extremity or obvious pulmonary edema on exam.  Additionally, she has been significantly diuresed and continues to have shortness of breath.   Additionally on her echo, her IVC is not dilated further ruling out volume overload.  Recommend looking for other causes of shortness of breath.  We Bree Heinzelman stop her Lasix for now pending creatinine tomorrow. Persistent atrial fibrillation: Currently on dofetilide.  Status post multiple ablations.  We Delonda Coley continue her anticoagulation and dofetilide.  QTc remained stable. Sick sinus syndrome: Status post pacemaker.  Device has been functioning appropriately on remote transmissions.  No changes.  Shynice Sigel M. Tyshauna Finkbiner MD 12/10/2021 3:50 PM

## 2021-12-10 NOTE — ED Notes (Signed)
Attempted to wean pt off NRB placed on 3L by consulting MD pt. Maintain 3L NRB at 90 attempted to place on  @ 3L pt is consistent now 85-88% 3L will notify RT and MD aware. Pt Is eating and is not c/o of anything but wanting a admission bed

## 2021-12-10 NOTE — ED Notes (Incomplete)
Pt was not tolerating the high flow [place] continued to desat in the high 80's never above 90.  Attempted venti mask at 14 as well prior NRB placed, pt breathing workload was increasing and pt couldn't rest pt. Placed back

## 2021-12-10 NOTE — H&P (Signed)
History and Physical    PatientKAMANI [name] ZOX:[id number] DOB: October 23, 1945 DOA: 12/10/2021 DOS: the patient was seen and examined on 12/10/2021 PCP: [name], MD  Patient coming from: Home via EMS  Chief Complaint:  Chief Complaint  Patient presents with   Shortness of Breath    BIB EMS, hx of CHF, x2 weeks SOB on exertion. Pt c/o edema bilateral lower leg. On 12L NRB upon arrival. Denies chest pain.    HPI: [name] is a 76 y.o. female with medical history significant of hypertension, hyperlipidemia, CHF, CAD s/p PCI, atrial fibrillation on chronic anticoagulation, tachybradycardia syndrome s/p PM, and OSA presents with complaints of shortness of breath acutely worse yesterday.  She checks her weight daily and notes that it may have been up only 5 pounds.  In addition to this patient reports having some orthopnea and lower extremity swelling.  Patient has been taking her Lasix as prescribed and taking extra dose every fourth day or so.  However, she reported that recently her weight did not go down like it normally does after taking extra dose of Lasix.  She had eaten some pork loins at [employer] yesterday that she believes did not agree with her stomach, and had 1 episode of nausea and vomiting.  Denies any recent fever, chills, chest pain, cough, wheezing, abdominal pain, diarrhea, constipation, or dysuria.  Normally patient is not on oxygen at baseline, but previously had been borderline in the past 2 qualify for home oxygen.  She has an reports that she has appointment with Dr. Golden Pop PA on 6/19.  Upon admission to the emergency department patient was seen to be afebrile with pulse 57-85, respirations 11-35, blood pressure 118/102 to 142/110, and O2 saturations noted to be as low as 87% with improvement on the BiPAP and O2 saturation currently 96%.  Labs significant for WBC 15, potassium 5.3, BUN 31, creatinine 1.35, glucose 203, AST 73, AST 65, BNP 122.2, and troponin 4.  Chest  x-ray noted mild "medically with mild vascular congestion.  Patient has been given furosemide 60 mg IV x1 dose.  TRH called to admit.   Review of Systems: As mentioned in the history of present illness. All other systems reviewed and are negative.  Past Medical History:  Diagnosis Date   Anticoagulant long-term use    pradaxa   Anxiety    Arthritis    "fingers, lower back" (04/23/2017    CAD (coronary artery disease) 8119,1478   post PTCA with bare-metal stenting to mid RCA in December 2004      CHF (congestive heart failure) (HCC)    Chronic atrial fibrillation ([place]) 06/2007   Tachybradycardia pacemaker   Chronic kidney disease    10% function - ?R, other kidney is compensating     CVA (cerebral vascular accident) [facility]) [id number]   denies residual on 04/23/2017   Depression    Diplopia 06/19/2008   Qualifier: Diagnosis of  By: [name] MD, [name]    Dysrhythmia    ATRIAL FIBRILATION   Edema of lower extremity    Hyperlipidemia    Hypertension    Inferior myocardial infarction [facility])    acute inferior wall mi/other medical hx   Myocardial infarction [facility]) [id number]   Obesity    OSA on CPAP    last test- 2010   Pacemaker    Pneumonia 2014   tx. ----  [facility]   Pulmonary hypertension ([place])    moderate pulmonary hypertension by 10/2016 echo and  10/2013 cardiac cath   Shortness of breath    Skin cancer    "cut off right [place]; burned off LLE" (04/23/2017)   Sleep apnea    Spondylolisthesis    TIA (transient ischemic attack) 2008   Unspecified hemorrhoids without mention of complication 3/71/0626   Colonoscopy--Dr. Carlean Purl    Past Surgical History:  Procedure Laterality Date   ABDOMINAL HYSTERECTOMY     [place] N/A 09/29/2016   Procedure: Atrial Fibrillation Ablation;  Surgeon: Will Meredith Leeds, MD;  Location: [place] CV LAB;  Service: Cardiovascular;  Laterality: N/A;   ATRIAL FIBRILLATION ABLATION N/A 02/07/2018    Procedure: ATRIAL FIBRILLATION ABLATION;  Surgeon: Constance Haw, MD;  Location: [place] CV LAB;  Service: Cardiovascular;  Laterality: N/A;   ATRIAL FIBRILLATION ABLATION N/A 03/13/2019   Procedure: ATRIAL FIBRILLATION ABLATION;  Surgeon: Constance Haw, MD;  Location: [place] CV LAB;  Service: Cardiovascular;  Laterality: N/A;   ATRIAL FIBRILLATION ABLATION N/A 07/07/2020   Procedure: ATRIAL FIBRILLATION ABLATION;  Surgeon: Constance Haw, MD;  Location: [place] CV LAB;  Service: Cardiovascular;  Laterality: N/A;   BACK SURGERY     CARDIOVERSION N/A 09/12/2017   Procedure: CARDIOVERSION;  Surgeon: Jerline Pain, MD;  Location: [facility] ENDOSCOPY;  Service: Cardiovascular;  Laterality: N/A;   CARDIOVERSION N/A 12/13/2017   Procedure: CARDIOVERSION;  Surgeon: Sanda Klein, MD;  Location: [place] ENDOSCOPY;  Service: Cardiovascular;  Laterality: N/A;   CARDIOVERSION N/A 02/18/2018   Procedure: CARDIOVERSION;  Surgeon: Dorothy Spark, MD;  Location: [facility] ENDOSCOPY;  Service: Cardiovascular;  Laterality: N/A;   CARDIOVERSION N/A 05/20/2018   Procedure: CARDIOVERSION;  Surgeon: [name], MD;  Location: [facility] ENDOSCOPY;  Service: Cardiovascular;  Laterality: N/A;   CARDIOVERSION N/A 04/16/2019   Procedure: CARDIOVERSION;  Surgeon: Donato Heinz, MD;  Location: [place];  Service: Endoscopy;  Laterality: N/A;   CARDIOVERSION N/A 01/22/2020   Procedure: CARDIOVERSION;  Surgeon: Geralynn Rile, MD;  Location: [place];  Service: Cardiovascular;  Laterality: N/A;   CARDIOVERSION N/A 02/18/2020   Procedure: CARDIOVERSION;  Surgeon: Elouise Munroe, MD;  Location: [facility] ENDOSCOPY;  Service: Cardiovascular;  Laterality: N/A;   CATARACT EXTRACTION W/ INTRAOCULAR LENS  IMPLANT, BILATERAL Bilateral 01/15/2017- 03/2017   CHOLECYSTECTOMY     CORONARY ANGIOPLASTY  X 2   CORONARY ANGIOPLASTY WITH [place]; ~ 2007; ?date   "1 stent; replaced stent; not  sure when I got the last stent" (04/23/2017)   DOPPLER ECHOCARDIOGRAPHY  2009   ELECTROPHYSIOLOGIC STUDY N/A 03/31/2016   Procedure: Cardioversion;  Surgeon: Evans Lance, MD;  Location: [place] CV LAB;  Service: Cardiovascular;  Laterality: N/A;   ELECTROPHYSIOLOGIC STUDY N/A 08/04/2016   Procedure: Cardioversion;  Surgeon: Evans Lance, MD;  Location: [employer] CV LAB;  Service: Cardiovascular;  Laterality: N/A;   INSERT / REPLACE / REMOVE PACEMAKER  06/2007   IR RADIOLOGY PERIPHERAL GUIDED IV START  01/31/2018   IR US GUIDE VASC ACCESS LEFT  01/31/2018   JOINT REPLACEMENT     LAPAROSCOPIC CHOLECYSTECTOMY  1994   LEFT AND RIGHT HEART CATHETERIZATION WITH CORONARY ANGIOGRAM N/A 10/06/2013   Procedure: LEFT AND RIGHT HEART CATHETERIZATION WITH CORONARY ANGIOGRAM;  Surgeon: Troy Sine, MD;  Location: [facility] CATH LAB;  Service: Cardiovascular;  Laterality: N/A;   LEFT OOPHORECTOMY Left ~ [place]  2000s - 04/2015 X 3   L3-4; L4-5; L2-3; Dr  Poole   RIGHT/LEFT HEART CATH AND CORONARY ANGIOGRAPHY N/A 05/09/2017   Procedure: RIGHT/LEFT HEART CATH AND CORONARY ANGIOGRAPHY;  Surgeon: Sherren Mocha, MD;  Location: [place] CV LAB;  Service: Cardiovascular;  Laterality: N/A;   SKIN CANCER EXCISION Right    [place]   TEE WITHOUT CARDIOVERSION N/A 09/29/2016   Procedure: TRANSESOPHAGEAL ECHOCARDIOGRAM (TEE);  Surgeon: Jerline Pain, MD;  Location: [facility] ENDOSCOPY;  Service: Cardiovascular;  Laterality: N/A;   TOTAL ABDOMINAL HYSTERECTOMY  1984   "uterus & right ovary"   TOTAL KNEE ARTHROPLASTY Left 04/23/2017   TOTAL KNEE ARTHROPLASTY Left 04/23/2017   Procedure: TOTAL KNEE ARTHROPLASTY;  Surgeon: Frederik Pear, MD;  Location: [place];  Service: Orthopedics;  Laterality: Left;   ULTRASOUND GUIDANCE FOR VASCULAR ACCESS  05/09/2017   Procedure: Ultrasound Guidance For Vascular Access;  Surgeon: Sherren Mocha, MD;  Location: [place] CV LAB;  Service: Cardiovascular;;   Social  History:  reports that she quit smoking about 39 years ago. Her smoking use included cigarettes. She started smoking about 43 years ago. She has a 5.00 pack-year smoking history. She has never used smokeless tobacco. She reports current alcohol use. She reports that she does not use drugs.  Allergies  Allergen Reactions   Hydrocodone-Guaifenesin Other (See Comments)    Confusion/ memory loss   Adhesive [Tape] Rash and Other (See Comments)    Allergic to defibrillation pads.   Pedi-Pre Tape Spray [Wound Dressing Adhesive] Other (See Comments) and Rash    Allergic to defibrillation pads.    Family History  Problem Relation Age of Onset   Suicidality Father        suicide death pt was 3 yrs, 12   Arrhythmia Mother    Hypertension Mother    Diabetes Mother    Dementia Mother    Heart attack Brother    Heart disease Paternal Aunt    Prostate cancer Maternal Grandfather    Diabetes Paternal Grandfather        fathers side of the family   Colon cancer Maternal Aunt     Prior to Admission medications   Medication Sig Start Date End Date Taking? Authorizing Provider  apixaban (ELIQUIS) 5 MG TABS tablet Take 1 tablet (5 mg total) by mouth 2 (two) times daily. 07/07/21  Yes Camnitz, Ocie Doyne, MD  Ascorbic Acid (VITAMIN C) 1000 MG tablet Take 1,000 mg by mouth at bedtime.    Yes [provider]  atorvastatin (LIPITOR) 40 MG tablet TAKE 1 TABLET BY MOUTH DAILY Patient taking differently: Take 40 mg by mouth daily. 11/11/21  Yes Camnitz, Ocie Doyne, MD  Calcium Carbonate-Vitamin D (CALCIUM 600+D PO) Take 1 tablet by mouth daily.    Yes [provider]  cholecalciferol (VITAMIN D) 25 MCG (1000 UNIT) tablet Take 1,000 Units by mouth at bedtime.   Yes [provider]  diltiazem (CARDIZEM LA) 360 MG 24 hr tablet Take 1 tablet (360 mg total) by mouth daily. 07/07/21  Yes Camnitz, Will Hassell Done, MD  dofetilide (TIKOSYN) 250 MCG capsule TAKE ONE CAPSULE BY MOUTH TWICE A  DAY Patient taking differently: Take 250 mcg by mouth 2 (two) times daily. 09/21/21  Yes Camnitz, Will Hassell Done, MD  DULoxetine (CYMBALTA) 60 MG capsule TAKE 1 CAPSULE BY MOUTH EVERY DAY Patient taking differently: Take 60 mg by mouth daily. 09/09/21  Yes [name], [name], MD  fluticasone (FLONASE) 50 MCG/ACT nasal spray Place 1 spray into both nostrils daily as needed for allergies.   Yes [provider]  JARDIANCE 25 MG TABS tablet TAKE 1 TABLET BY MOUTH DAILY Patient taking differently: Take 25 mg by mouth daily. 07/20/21  Yes [name], [name], MD  loratadine (CLARITIN) 10 MG tablet TAKE 1 TABLET BY MOUTH EVERY DAY Patient taking differently: Take 10 mg by mouth daily as needed for allergies. 04/28/21  Yes [name], [name], MD  Lysine 1000 MG TABS Take 1,000 mg by mouth at bedtime.    Yes [provider]  metFORMIN (GLUCOPHAGE) 1000 MG tablet Take 1 tablet (1,000 mg total) by mouth 2 (two) times daily. 10/13/21  Yes [name], [name], MD  metolazone (ZAROXOLYN) 2.5 MG tablet TAKE 1 TABLET BY MOUTH EVERY 4  DAYS AS NEEDED FOR SWELLING  PLEASE KEEP UPCOMING APPOINTMENT IN JAN, 2023 FOR FURTHER REFILLS Patient taking differently: Take 2.5 mg by mouth See admin instructions. Every 4 days as needed for swelling 08/30/21  Yes Camnitz, Ocie Doyne, MD  metoprolol tartrate (LOPRESSOR) 50 MG tablet Take 3 tablets (150 mg total) by mouth daily. TAKE 50 MG IN AM TAKE 100 MG IN PM Patient taking differently: Take 50-100 mg by mouth See admin instructions. 50 mg in the morning  100 mg in the evening 10/06/21  Yes Baldwin Jamaica, PA-C  Multiple Vitamin (MULTIVITAMIN WITH MINERALS) TABS tablet Take 1 tablet by mouth daily. Centrum   Yes [provider]  NON FORMULARY CPAP at bedtime   Yes [provider]  nystatin-triamcinolone (MYCOLOG II) cream Apply 1 application topically 2 (two) times daily as needed. Patient taking differently: Apply 1 application  topically 2 (two) times daily  as needed (rash). 08/23/21  Yes [name], [name], MD  omeprazole (PRILOSEC) 20 MG capsule Take 20 mg by mouth 2 (two) times daily.   Yes [provider]  Polyethyl Glycol-Propyl Glycol (SYSTANE) 0.4-0.3 % SOLN Place 1 drop into both eyes at bedtime.   Yes [provider]  potassium chloride SA (KLOR-CON M) 20 MEQ tablet TAKE 2 TABLETS BY MOUTH TWICE  DAILY Patient taking differently: Take 40 mEq by mouth 2 (two) times daily. 07/19/21  Yes Camnitz, Will Hassell Done, MD  torsemide (DEMADEX) 20 MG tablet TAKE 2 TABLETS BY MOUTH TWICE  DAILY Patient taking differently: Take 40 mg by mouth 2 (two) times daily. 08/22/21  Yes Camnitz, Will Hassell Done, MD  valACYclovir (VALTREX) 1000 MG tablet TAKE 2 TABLETS BY MOUTH EVERY 12 HOURS AS NEEDED Patient taking differently: Take 2,000 mg by mouth 2 (two) times daily as needed (fever blisters). 09/07/21  Yes [name], [name], MD  vitamin B-12 (CYANOCOBALAMIN) 1000 MCG tablet Take 1,000 mcg by mouth at bedtime.   Yes [provider]  nitroGLYCERIN (NITROSTAT) 0.4 MG SL tablet Place 1 tablet (0.4 mg total) under the tongue every 5 (five) minutes x 3 doses as needed for chest pain. PLEASE CALL [phone] AND SCHEDULE AN OVERDUE APPT WITH DR Acie Fredrickson FOR FURTHER REFILLS TO BE GRANTED (2ND ATTEMPT) Patient taking differently: Place 0.4 mg under the tongue every 5 (five) minutes x 3 doses as needed for chest pain. 07/05/21   Nahser, Wonda Cheng, MD    Physical Exam: Vitals:   12/10/21 0545 12/10/21 0600 12/10/21 0810 12/10/21 0910  BP:  (!) 127/56 (!) 140/55   Pulse: 75 73 85 82  Resp: (!) 30 (!) 25 (!) 32 (!) 35  Temp:      TempSrc:      SpO2: (!) 88% (!) 87% 95% 96%  Weight:  Height:       Constitutional: Pleasant elderly female who appears to be in some respiratory distress Eyes: PERRL, lids and conjunctivae normal ENMT: Mucous membranes are moist. Posterior pharynx clear of any exudate or lesions.Normal dentition.  Neck: normal, supple, JVD  present Respiratory: Decreased overall aeration without significant wheezes or rhonchi appreciated.  Patient on high flow nasal cannula oxygen with O2 saturations in the upper 80s.  Patient only able to speak in shortened sentences. Cardiovascular: Regular rate and rhythm, no murmurs / rubs / gallops.  +1 pitting lower extremity edema.   Abdomen: no tenderness, no masses palpated. Bowel sounds positive.  Musculoskeletal: No joint deformity upper and lower extremities. Good ROM, no contractures. Normal muscle tone.  Skin: no rashes, lesions, ulcers. No induration Neurologic: CN 2-12 grossly intact.  Able to move all extremities. Psychiatric: Normal judgment and insight. Alert and oriented x 3. Normal mood.   Data Reviewed:  EKG revealed sinus rhythm at 60 bpm with first-degree heart block and QTc was 480.  Assessment and Plan: Acute respiratory failure secondary to heart failure with preserved ejection fraction Acute on chronic.  Patient presents with progressively worsening shortness of breath.  Reports weight did not decrease does after taking extra dose of her diuretics.  Chest x-ray noted cardiomegaly with pulmonary vascular congestion.  BNP was elevated at 122.2.  Last echocardiogram noted EF of 60 to 65% with indeterminant diastolic parameters and 27/7824.  Patient had been given Lasix 60 mg IV in the ED.  Usually -Admit to a progressive bed -Heart failure order set utilized -Strict I&O's and daily weights -Continuous pulse oximetry with oxygen maintain O2 saturation greater than 92% -Continue BiPAP and wean off once able -Check echocardiogram -Lasix 60 mg IV twice daily -Cardiology consulted, we will follow-up for any further recommendations  SIRS Patient was noted to be tachypneic with initial white blood cell count elevated at 15.  Denies any recent fever, cough, or wheezing.  Thought to be possibly reactive in nature. -Check procalcitonin -Will monitor off of antibiotics at this  time.  Hyperkalemia Acute.  Initial potassium 5.3 on admission.  Home medication regimen includes potassium chloride 40 mEq twice daily.  Patient had been given Lasix in the ED. -Hold potassium supplementation -Continue to monitor and treat as needed  Paroxysmal atrial fibrillation on anticoagulation tachybradycardia syndrome s/p permanent pacemaker Patient appears to be rate controlled at this time.  Status post multiple ablations.  Last device interrogation noted normal function 10/06/2021.  Patient follows with Dr. Curt Bears of cardiology in the outpatient setting. -Continue Tikosyn and Eliquis  CAD Patient with prior PCI.  Noted to have coronary CTA back in 06/2020 -Continue anticoagulation and statin  Diabetes mellitus type 2, with hyperlipidemia Patient glucose elevated at 203.  Last hemoglobin A1c 8 on 08/23/21.  Home medication regimen includes metformin 1000 mg twice daily, Jardiance 25 mg daily, and atorvastatin 40 mg daily. -Hypoglycemic protocols -Hold metformin -Continue Jardiance and atorvastatin -CBGs before every meal with moderate SSI  Essential Hypertension -Continue current med regimen as tolerated  Transaminitis  Chronic. On admission AST 73 and ALT 65 intermittently elevated in there past.  Chronic kidney disease stage IIIb Patient presents with creatinine of 1.35 with BUN 31.  Baseline creatinine appears to range from 1.1-1.4. -Continue to monitor with diuresis  OSA -Continue CPAP at night    Obesity BMI 39.39 kg/m  GERD -Continue pharmacy substitution of Prilosec  DVT prophylaxis: Eliquis Advance Care Planning:   Code Status: Full Code  Consults: Cardiology  Family Communication: None requested  Severity of Illness: The appropriate patient status for this patient is INPATIENT. Inpatient status is judged to be reasonable and necessary in order to provide the required intensity of service to ensure the patient's safety. The patient's presenting  symptoms, physical exam findings, and initial radiographic and laboratory data in the context of their chronic comorbidities is felt to place them at high risk for further clinical deterioration. Furthermore, it is not anticipated that the patient will be medically stable for discharge from the hospital within 2 midnights of admission.   * I certify that at the point of admission it is my clinical judgment that the patient will require inpatient hospital care spanning beyond 2 midnights from the point of admission due to high intensity of service, high risk for further deterioration and high frequency of surveillance required.*  Author: Norval Morton, MD 12/10/2021 9:11 AM  For on call review [email or web address].

## 2021-12-10 NOTE — ED Provider Notes (Signed)
[facility] EMERGENCY DEPARTMENT Provider Note  CSN: [id number] Arrival date & time: 12/10/21 0014  Chief Complaint(s) Shortness of Breath (BIB EMS, hx of CHF, x2 weeks SOB on exertion. Pt c/o edema bilateral lower leg. On 12L NRB upon arrival. Denies chest pain. )  HPI [name] is a 76 y.o. female    The history is provided by the patient.  Shortness of Breath Severity:  Severe Onset quality:  Gradual Duration:  1 week Timing:  Constant Progression:  Worsening Chronicity:  Recurrent Relieved by:  Rest Worsened by:  Exertion, movement and activity Ineffective treatments:  Diuretics Associated symptoms: no chest pain, no fever, no sputum production and no wheezing   Associated symptoms comment:  Peripheral edema Risk factors: no prolonged immobilization   Risk factors comment:  CHF   Past Medical History Past Medical History:  Diagnosis Date   Anticoagulant long-term use    pradaxa   Anxiety    Arthritis    "fingers, lower back" (04/23/2017    CAD (coronary artery disease) 2706,2376   post PTCA with bare-metal stenting to mid RCA in December 2004      CHF (congestive heart failure) (HCC)    Chronic atrial fibrillation ([place]) 06/2007   Tachybradycardia pacemaker   Chronic kidney disease    10% function - ?R, other kidney is compensating     CVA (cerebral vascular accident) ([place]) [phone],5176   denies residual on 04/23/2017   Depression    Diplopia 06/19/2008   Qualifier: Diagnosis of  By: [name] MD, [name]    Dysrhythmia    ATRIAL FIBRILATION   Edema of lower extremity    Hyperlipidemia    Hypertension    Inferior myocardial infarction [facility])    acute inferior wall mi/other medical hx   Myocardial infarction [facility]) 1607,3710   Obesity    OSA on CPAP    last test- 2010   Pacemaker    Pneumonia 2014   tx. ----  [facility]   Pulmonary hypertension ([place])    moderate pulmonary hypertension by 10/2016 echo and 10/2013 cardiac cath    Shortness of breath    Skin cancer    "cut off right [place]; burned off LLE" (04/23/2017)   Sleep apnea    Spondylolisthesis    TIA (transient ischemic attack) 2008   Unspecified hemorrhoids without mention of complication 12/27/9483   Colonoscopy--Dr. Carlean Purl    Patient Active Problem List   Diagnosis Date Noted   Type 2 diabetes mellitus with hyperlipidemia ([place]) 05/22/2020   Acute respiratory failure ([place]) 05/19/2020   Hypokalemia 05/19/2020   Secondary hypercoagulable state ([place]) 02/16/2020   CHF exacerbation ([place]) 05/31/2018   Bronchitis 05/31/2018   Primary osteoarthritis of left knee 04/23/2017   Degenerative arthritis of left knee 04/19/2017   Atypical atrial flutter ([place]) 09/30/2016   Hematoma 09/30/2016   Hypotension 09/30/2016   Persistent atrial fibrillation ([place]) 09/29/2016   Hyperlipidemia    HLD (hyperlipidemia)    Controlled type 2 diabetes mellitus without complication ([place])    Acute respiratory failure with hypoxia ([place]) 03/26/2016   DDD (degenerative disc disease), lumbar 04/05/2015   Degenerative disc disease, lumbar 04/05/2015   Severe obesity (BMI >= 40) ([place]) 02/16/2015   Fatty liver disease, nonalcoholic 46/27/0350   Cough, persistent 04/03/2014   Acute on chronic diastolic congestive heart failure ([place]) 10/07/2013   Pre-diabetes 09/24/2013   Peripheral edema 08/05/2013   Neoplasm of uncertain behavior of skin of back 11/09/2012   Edema 11/04/2012  Bleeding mole 11/04/2012   Pulmonary hypertension ([place]) 06/11/2012   Pneumonia 06/05/2012   Hypoxia 06/05/2012   Anemia 06/05/2012   Hemorrhoids 05/11/2012   Dyspepsia 05/11/2012   Medication management 04/20/2011   Positional vertigo 10/07/2010   Anticoagulant long-term use    HERPES LABIALIS 12/30/2009   INTERTRIGO 12/30/2009   Obesity 06/02/2009   Obstructive sleep apnea 10/12/2008   HYPERLIPIDEMIA 09/15/2008   SNORING 09/15/2008   Essential hypertension 06/19/2008   CAD S/P percutaneous  coronary angioplasty 06/19/2008   Paroxysmal atrial fibrillation ([place]) 06/19/2008   BRADYCARDIA-TACHYCARDIA SYNDROME 06/19/2008   CEREBRAL ANEURYSM 06/19/2008   Allergic rhinitis 06/19/2008   HYPERGLYCEMIA 06/19/2008   DEPRESSION, HX OF 06/19/2008   CEREBROVASCULAR ACCIDENT, HX OF 06/19/2008   PACEMAKER, PERMANENT 06/19/2008   Home Medication(s) Prior to Admission medications   Medication Sig Start Date End Date Taking? Authorizing Provider  apixaban (ELIQUIS) 5 MG TABS tablet Take 1 tablet (5 mg total) by mouth 2 (two) times daily. 07/07/21  Yes Camnitz, Ocie Doyne, MD  Ascorbic Acid (VITAMIN C) 1000 MG tablet Take 1,000 mg by mouth at bedtime.    Yes [provider]  atorvastatin (LIPITOR) 40 MG tablet TAKE 1 TABLET BY MOUTH DAILY Patient taking differently: Take 40 mg by mouth daily. 11/11/21  Yes Camnitz, Ocie Doyne, MD  Calcium Carbonate-Vitamin D (CALCIUM 600+D PO) Take 1 tablet by mouth daily.    Yes [provider]  cholecalciferol (VITAMIN D) 25 MCG (1000 UNIT) tablet Take 1,000 Units by mouth at bedtime.   Yes [provider]  diltiazem (CARDIZEM LA) 360 MG 24 hr tablet Take 1 tablet (360 mg total) by mouth daily. 07/07/21  Yes Camnitz, Will Hassell Done, MD  dofetilide (TIKOSYN) 250 MCG capsule TAKE ONE CAPSULE BY MOUTH TWICE A DAY Patient taking differently: Take 250 mcg by mouth 2 (two) times daily. 09/21/21  Yes Camnitz, Will Hassell Done, MD  DULoxetine (CYMBALTA) 60 MG capsule TAKE 1 CAPSULE BY MOUTH EVERY DAY Patient taking differently: Take 60 mg by mouth daily. 09/09/21  Yes [name], [name], MD  fluticasone (FLONASE) 50 MCG/ACT nasal spray Place 1 spray into both nostrils daily as needed for allergies.   Yes [provider]  JARDIANCE 25 MG TABS tablet TAKE 1 TABLET BY MOUTH DAILY Patient taking differently: Take 25 mg by mouth daily. 07/20/21  Yes [name], [name], MD  loratadine (CLARITIN) 10 MG tablet TAKE 1 TABLET BY MOUTH EVERY DAY Patient taking  differently: Take 10 mg by mouth daily as needed for allergies. 04/28/21  Yes [name], [name], MD  Lysine 1000 MG TABS Take 1,000 mg by mouth at bedtime.    Yes [provider]  metFORMIN (GLUCOPHAGE) 1000 MG tablet Take 1 tablet (1,000 mg total) by mouth 2 (two) times daily. 10/13/21  Yes [name], [name], MD  metolazone (ZAROXOLYN) 2.5 MG tablet TAKE 1 TABLET BY MOUTH EVERY 4  DAYS AS NEEDED FOR SWELLING  PLEASE KEEP UPCOMING APPOINTMENT IN JAN, 2023 FOR FURTHER REFILLS Patient taking differently: Take 2.5 mg by mouth See admin instructions. Every 4 days as needed for swelling 08/30/21  Yes Camnitz, Ocie Doyne, MD  metoprolol tartrate (LOPRESSOR) 50 MG tablet Take 3 tablets (150 mg total) by mouth daily. TAKE 50 MG IN AM TAKE 100 MG IN PM Patient taking differently: Take 50-100 mg by mouth See admin instructions. 50 mg in the morning  100 mg in the evening 10/06/21  Yes Baldwin [place], PA-C  Multiple Vitamin (MULTIVITAMIN WITH MINERALS)  TABS tablet Take 1 tablet by mouth daily. Centrum   Yes [provider]  NON FORMULARY CPAP at bedtime   Yes [provider]  nystatin-triamcinolone (MYCOLOG II) cream Apply 1 application topically 2 (two) times daily as needed. Patient taking differently: Apply 1 application  topically 2 (two) times daily as needed (rash). 08/23/21  Yes [name], [name], MD  omeprazole (PRILOSEC) 20 MG capsule Take 20 mg by mouth 2 (two) times daily.   Yes [provider]  Polyethyl Glycol-Propyl Glycol (SYSTANE) 0.4-0.3 % SOLN Place 1 drop into both eyes at bedtime.   Yes [provider]  potassium chloride SA (KLOR-CON M) 20 MEQ tablet TAKE 2 TABLETS BY MOUTH TWICE  DAILY Patient taking differently: Take 40 mEq by mouth 2 (two) times daily. 07/19/21  Yes Camnitz, Will Hassell Done, MD  torsemide (DEMADEX) 20 MG tablet TAKE 2 TABLETS BY MOUTH TWICE  DAILY Patient taking differently: Take 40 mg by mouth 2 (two) times daily. 08/22/21  Yes Camnitz,  Will Hassell Done, MD  valACYclovir (VALTREX) 1000 MG tablet TAKE 2 TABLETS BY MOUTH EVERY 12 HOURS AS NEEDED Patient taking differently: Take 2,000 mg by mouth 2 (two) times daily as needed (fever blisters). 09/07/21  Yes [name], [name], MD  vitamin B-12 (CYANOCOBALAMIN) 1000 MCG tablet Take 1,000 mcg by mouth at bedtime.   Yes [provider]  nitroGLYCERIN (NITROSTAT) 0.4 MG SL tablet Place 1 tablet (0.4 mg total) under the tongue every 5 (five) minutes x 3 doses as needed for chest pain. PLEASE CALL [phone] AND SCHEDULE AN OVERDUE APPT WITH DR Acie Fredrickson FOR FURTHER REFILLS TO BE GRANTED (2ND ATTEMPT) Patient taking differently: Place 0.4 mg under the tongue every 5 (five) minutes x 3 doses as needed for chest pain. 07/05/21   Nahser, Wonda Cheng, MD                                                                                                                                    Allergies Hydrocodone-guaifenesin, Adhesive [tape], and Pedi-pre tape spray [wound dressing adhesive]  Review of Systems Review of Systems  Constitutional:  Negative for fever.  Respiratory:  Positive for shortness of breath. Negative for sputum production and wheezing.   Cardiovascular:  Negative for chest pain.   As noted in HPI  Physical Exam Vital Signs  I have reviewed the triage vital signs BP (!) 130/53   Pulse 69   Temp 98.2 F (36.8 C) (Oral)   Resp (!) 27   Ht '5\' 6"'$  (1.676 m)   Wt 110.7 kg   SpO2 94%   BMI 39.39 kg/m   Physical Exam Vitals reviewed.  Constitutional:      General: She is not in acute distress.    Appearance: She is well-developed. She is not diaphoretic.  HENT:     Head: Normocephalic and atraumatic.     Nose: Nose normal.  Eyes:  General: No scleral icterus.       Right eye: No discharge.        Left eye: No discharge.     Conjunctiva/sclera: Conjunctivae normal.     Pupils: Pupils are equal, round, and reactive to light.  Cardiovascular:     Rate and Rhythm:  Normal rate and regular rhythm.     Heart sounds: No murmur heard.    No friction rub. No gallop.  Pulmonary:     Effort: Pulmonary effort is normal. Tachypnea present. No respiratory distress.     Breath sounds: Normal breath sounds. No stridor. No rales.  Abdominal:     General: There is no distension.     Palpations: Abdomen is soft.     Tenderness: There is no abdominal tenderness.  Musculoskeletal:        General: No tenderness.     Cervical back: Normal range of motion and neck supple.     Right lower leg: 1+ Pitting Edema present.     Left lower leg: 1+ Pitting Edema present.  Skin:    General: Skin is warm and dry.     Findings: No erythema or rash.  Neurological:     Mental Status: She is alert and oriented to person, place, and time.     ED Results and Treatments Labs (all labs ordered are listed, but only abnormal results are displayed) Labs Reviewed  COMPREHENSIVE METABOLIC PANEL - Abnormal; Notable for the following components:      Result Value   Potassium 5.3 (*)    Glucose, Bld 203 (*)    BUN 31 (*)    Creatinine, Ser 1.35 (*)    AST 73 (*)    ALT 65 (*)    GFR, Estimated 41 (*)    All other components within normal limits  CBC - Abnormal; Notable for the following components:   WBC 15.0 (*)    All other components within normal limits  BRAIN NATRIURETIC PEPTIDE - Abnormal; Notable for the following components:   B Natriuretic Peptide 122.2 (*)    All other components within normal limits  TROPONIN I (HIGH SENSITIVITY)  TROPONIN I (HIGH SENSITIVITY)                                                                                                                         EKG  EKG Interpretation  Date/Time:  Saturday December 10 2021 00:18:43 EDT Ventricular Rate:  60 PR Interval:  288 QRS Duration: 91 QT Interval:  480 QTC Calculation: 480 R Axis:   84 Text Interpretation: Sinus  atrial rhythm Prolonged PR interval Baseline wander in lead(s) V2 V3 No  acute changes Confirmed by Addison Lank [phone]) on 12/10/2021 1:30:16 AM       Radiology DG Chest Port 1 View  Result Date: 12/10/2021 CLINICAL DATA:  Shortness of breath. EXAM: PORTABLE CHEST 1 VIEW COMPARISON:  Chest radiograph dated 05/27/2020. FINDINGS: Mild cardiomegaly with mild vascular congestion. No focal consolidation,  pleural effusion, or pneumothorax. Left pectoral pacemaker device. Atherosclerotic calcification of the aortic arch. No acute osseous pathology. IMPRESSION: Mild cardiomegaly with mild vascular congestion. Electronically Signed   By: Anner Crete M.D.   On: 12/10/2021 00:50    Pertinent labs & imaging results that were available during my care of the patient were reviewed by me and considered in my medical decision making (see MDM for details).  Medications Ordered in ED Medications  furosemide (LASIX) injection 60 mg (60 mg Intravenous Given 12/10/21 0200)                                                                                                                                     Procedures .Critical Care  Performed by: Fatima Blank, MD Authorized by: Fatima Blank, MD   Critical care provider statement:    Critical care time (minutes):  75   Critical care time was exclusive of:  Separately billable procedures and treating other patients   Critical care was necessary to treat or prevent imminent or life-threatening deterioration of the following conditions:  Respiratory failure and cardiac failure   Critical care was time spent personally by me on the following activities:  Development of treatment plan with patient or surrogate, discussions with consultants, evaluation of patient's response to treatment, examination of patient, obtaining history from patient or surrogate, review of old charts, re-evaluation of patient's condition, pulse oximetry, ordering and review of radiographic studies, ordering and review of laboratory studies and  ordering and performing treatments and interventions   Care discussed with: admitting provider     (including critical care time)  Medical Decision Making / ED Course    Complexity of Problem:  Co-morbidities/SDOH that complicate the patient evaluation/care: Noted above in HPI   Patient's presenting problem/concern, DDX, and MDM listed below: Shortness of breath Most concerning for CHF exacerbation. We will rule out pneumonia/pneumothorax. Lower suspicion for pulmonary embolism.  Hospitalization Considered:  Yes    Complexity of Data:   Cardiac Monitoring: The patient was maintained on a cardiac monitor.   I personally viewed and interpreted the cardiac monitored which showed an underlying rhythm of normal sinus rhythm with rates in the 60s to 70s.  No dysrhythmias or blocks.  Laboratory Tests ordered listed below with my independent interpretation: CBC with mild leukocytosis.  No anemia. Metabolic panel with mild hypokalemia.  Mild AKI.  Hyperglycemia without evidence of DKA. BNP less than 150.   Imaging Studies ordered listed below with my independent interpretation: Chest x-ray with cardiomegaly and pulmonary vascular congestion without overt pulmonary edema, pneumothorax, pneumonia, pleural effusion     ED Course:    Assessment, Add'l Intervention, and Reassessment: Shortness of breath Most consistent with CHF exacerbation She is currently requiring nonrebreather We will attempt to diurese patient here in the emergency department After 5 hours of diuresing well, the patient was reevaluated.  Attempt to wean her off  of oxygen was unsuccessful and patient needed to be placed back on 6 L nasal cannula due to sats dropping to 84% on room air while at rest. We will consult hospitalist service for admission. I spoke with Dr. Marlowe Sax, Who agreed to admit the patient.    Final Clinical Impression(s) / ED Diagnoses Final diagnoses:  Acute respiratory failure with  hypoxia (HCC)  Acute on chronic diastolic CHF (congestive heart failure) ([place])           This chart was dictated using voice recognition software.  Despite best efforts to proofread,  errors can occur which can change the documentation meaning.    Fatima Blank, MD 12/10/21 [phone]

## 2021-12-11 ENCOUNTER — Other Ambulatory Visit: Payer: Self-pay

## 2021-12-11 ENCOUNTER — Encounter (HOSPITAL_COMMUNITY): Payer: Self-pay | Admitting: Internal Medicine

## 2021-12-11 DIAGNOSIS — I480 Paroxysmal atrial fibrillation: Secondary | ICD-10-CM | POA: Diagnosis not present

## 2021-12-11 DIAGNOSIS — I5033 Acute on chronic diastolic (congestive) heart failure: Secondary | ICD-10-CM | POA: Diagnosis not present

## 2021-12-11 DIAGNOSIS — I495 Sick sinus syndrome: Secondary | ICD-10-CM | POA: Diagnosis not present

## 2021-12-11 LAB — CBC
HCT: 45.5 % (ref 36.0–46.0)
Hemoglobin: 14.4 g/dL (ref 12.0–15.0)
MCH: 31 pg (ref 26.0–34.0)
MCHC: 31.6 g/dL (ref 30.0–36.0)
MCV: 97.8 fL (ref 80.0–100.0)
Platelets: 262 10*3/uL (ref 150–400)
RBC: 4.65 MIL/uL (ref 3.87–5.11)
RDW: 14.6 % (ref 11.5–15.5)
WBC: 12.5 10*3/uL — ABNORMAL HIGH (ref 4.0–10.5)
nRBC: 0 % (ref 0.0–0.2)

## 2021-12-11 LAB — COMPREHENSIVE METABOLIC PANEL
ALT: 47 U/L — ABNORMAL HIGH (ref 0–44)
AST: 28 U/L (ref 15–41)
Albumin: 3.4 g/dL — ABNORMAL LOW (ref 3.5–5.0)
Alkaline Phosphatase: 84 U/L (ref 38–126)
Anion gap: 10 (ref 5–15)
BUN: 16 mg/dL (ref 8–23)
CO2: 34 mmol/L — ABNORMAL HIGH (ref 22–32)
Calcium: 9.2 mg/dL (ref 8.9–10.3)
Chloride: 97 mmol/L — ABNORMAL LOW (ref 98–111)
Creatinine, Ser: 1.3 mg/dL — ABNORMAL HIGH (ref 0.44–1.00)
GFR, Estimated: 43 mL/min — ABNORMAL LOW (ref >60)
Glucose, Bld: 165 mg/dL — ABNORMAL HIGH (ref 70–99)
Potassium: 3.4 mmol/L — ABNORMAL LOW (ref 3.5–5.1)
Sodium: 141 mmol/L (ref 135–145)
Total Bilirubin: 1.1 mg/dL (ref 0.3–1.2)
Total Protein: 6.9 g/dL (ref 6.5–8.1)

## 2021-12-11 LAB — GLUCOSE, CAPILLARY
Glucose-Capillary: 154 mg/dL — ABNORMAL HIGH (ref 70–99)
Glucose-Capillary: 147 mg/dL — ABNORMAL HIGH (ref 70–99)
Glucose-Capillary: 203 mg/dL — ABNORMAL HIGH (ref 70–99)
Glucose-Capillary: 191 mg/dL — ABNORMAL HIGH (ref 70–99)

## 2021-12-11 MED ORDER — POTASSIUM CHLORIDE CRYS ER 20 MEQ PO TBCR
40.0000 meq | EXTENDED_RELEASE_TABLET | Freq: Two times a day (BID) | ORAL | Status: DC
  Administered 2021-12-11 – 2021-12-14 (×6): 40 meq via ORAL
  Filled 2021-12-11 (×7): qty 2

## 2021-12-11 MED ORDER — POTASSIUM CHLORIDE CRYS ER 20 MEQ PO TBCR
20.0000 meq | EXTENDED_RELEASE_TABLET | ORAL | Status: AC
  Administered 2021-12-11 (×3): 20 meq via ORAL
  Filled 2021-12-11 (×2): qty 1

## 2021-12-11 NOTE — Progress Notes (Signed)
Progress Note  Patient Name: [name] Date of Encounter: 12/11/2021  [facility] Cardiologist: Mertie Moores, MD   Subjective   Feeling well.  Continues to remain on oxygen for low saturations.  Net -1.3 L since admission.  Inpatient Medications    Scheduled Meds:  apixaban  5 mg Oral BID   atorvastatin  40 mg Oral Daily   [START ON 12/12/2021] chlorhexidine  15 mL Mouth Rinse BID   diltiazem  360 mg Oral Daily   dofetilide  250 mcg Oral BID   DULoxetine  60 mg Oral Daily   empagliflozin  25 mg Oral Daily   furosemide  60 mg Intravenous BID   insulin aspart  0-15 Units Subcutaneous TID WC   mouth rinse  15 mL Mouth Rinse q12n4p   metoprolol tartrate  50 mg Oral q morning   And   metoprolol tartrate  100 mg Oral QHS   pantoprazole  40 mg Oral BID   polyvinyl alcohol  1 drop Both Eyes QHS   potassium chloride  40 mEq Oral BID   sodium chloride flush  3 mL Intravenous Q12H   Continuous Infusions:  PRN Meds: acetaminophen **OR** acetaminophen, albuterol, fluticasone, loratadine, nystatin-triamcinolone   Vital Signs    Vitals:   12/11/21 0453 12/11/21 0557 12/11/21 0600 12/11/21 0645  BP: (!) 122/51 (!) 107/43    Pulse: 68 66 66   Resp: (!) 21 (!) 24 20   Temp: 98.3 F (36.8 C) 98.5 F (36.9 C)    TempSrc:  Oral    SpO2: 94% 90% 93%   Weight:  108.7 kg  108.4 kg  Height:        Intake/Output Summary (Last 24 hours) at 12/11/2021 0738 Last data filed at 12/11/2021 0645 Gross per 24 hour  Intake 160 ml  Output 1500 ml  Net -1340 ml      12/11/2021    6:45 AM 12/11/2021    5:57 AM 12/10/2021    6:33 PM  Last 3 Weights  Weight (lbs) 239 lb 239 lb 10.2 oz 242 lb  Weight (kg) 108.41 kg 108.7 kg 109.77 kg      Telemetry    Sinus rhythm- Personally Reviewed  ECG    Sinus rhythm- Personally Reviewed  Physical Exam   GEN: No acute distress.   Neck: No JVD Cardiac: RRR, no murmurs, rubs, or gallops.  Respiratory: Fine crackles bilaterally. GI:  Soft, nontender, non-distended  MS: No edema; No deformity. Neuro:  Nonfocal  Psych: Normal affect   Labs    High Sensitivity Troponin:   Recent Labs  Lab 12/10/21 0700  TROPONINIHS 4     Chemistry Recent Labs  Lab 12/10/21 0022 12/11/21 0319  NA 139 141  K 5.3* 3.4*  CL 100 97*  CO2 26 34*  GLUCOSE 203* 165*  BUN 31* 16  CREATININE 1.35* 1.30*  CALCIUM 9.4 9.2  PROT 6.9 6.9  ALBUMIN 3.6 3.4*  AST 73* 28  ALT 65* 47*  ALKPHOS 88 84  BILITOT 1.1 1.1  GFRNONAA 41* 43*  ANIONGAP 13 10    Lipids No results for input(s): "CHOL", "TRIG", "HDL", "LABVLDL", "LDLCALC", "CHOLHDL" in the last 168 hours.  Hematology Recent Labs  Lab 12/10/21 0022 12/11/21 0319  WBC 15.0* 12.5*  RBC 4.62 4.65  HGB 14.4 14.4  HCT 44.7 45.5  MCV 96.8 97.8  MCH 31.2 31.0  MCHC 32.2 31.6  RDW 14.5 14.6  PLT 274 262   Thyroid No  results for input(s): "TSH", "FREET4" in the last 168 hours.  BNP Recent Labs  Lab 12/10/21 0022  BNP 122.2*    DDimer No results for input(s): "DDIMER" in the last 168 hours.   Radiology    ECHOCARDIOGRAM COMPLETE  Result Date: 12/10/2021    ECHOCARDIOGRAM REPORT   Patient Name:   [name] Date of Exam: 12/10/2021 Medical Rec #:  [id number]    Height:       66.0 in Accession #:    [id number]   Weight:       244.0 lb Date of Birth:  12/12/1945     BSA:          2.176 m Patient Age:    4 years     BP:           135/57 mmHg Patient Gender: F            HR:           86 bpm. Exam Location:  Inpatient Procedure: 2D Echo, Cardiac Doppler and Color Doppler Indications:    CHF  History:        Patient has prior history of Echocardiogram examinations, most                 recent 06/01/2018. CAD, Arrythmias:Atrial Fibrillation; Risk                 Factors:Sleep Apnea, Diabetes and Hypertension.  Sonographer:    Jefferey Pica Referring Phys: [id number] RONDELL A SMITH IMPRESSIONS  1. Left ventricular ejection fraction, by estimation, is 60 to 65%. The left ventricle has  normal function. The left ventricle has no regional wall motion abnormalities. Left ventricular diastolic parameters are indeterminate.  2. Right ventricular systolic function is normal. The right ventricular size is normal.  3. The mitral valve is normal in structure. Trivial mitral valve regurgitation. No evidence of mitral stenosis.  4. The aortic valve is tricuspid. Aortic valve regurgitation is not visualized. No aortic stenosis is present.  5. The inferior vena cava is normal in size with greater than 50% respiratory variability, suggesting right atrial pressure of 3 mmHg. FINDINGS  Left Ventricle: Left ventricular ejection fraction, by estimation, is 60 to 65%. The left ventricle has normal function. The left ventricle has no regional wall motion abnormalities. The left ventricular internal cavity size was normal in size. There is  no left ventricular hypertrophy. Left ventricular diastolic parameters are indeterminate. Right Ventricle: The right ventricular size is normal. Right ventricular systolic function is normal. Left Atrium: Left atrial size was normal in size. Right Atrium: Right atrial size was normal in size. Pericardium: There is no evidence of pericardial effusion. Mitral Valve: The mitral valve is normal in structure. Mild mitral annular calcification. Trivial mitral valve regurgitation. No evidence of mitral valve stenosis. Tricuspid Valve: The tricuspid valve is normal in structure. Tricuspid valve regurgitation is trivial. No evidence of tricuspid stenosis. Aortic Valve: The aortic valve is tricuspid. Aortic valve regurgitation is not visualized. No aortic stenosis is present. Aortic valve peak gradient measures 12.3 mmHg. Pulmonic Valve: The pulmonic valve was not well visualized. Pulmonic valve regurgitation is not visualized. No evidence of pulmonic stenosis. Aorta: The aortic root is normal in size and structure. Venous: The inferior vena cava is normal in size with greater than 50%  respiratory variability, suggesting right atrial pressure of 3 mmHg. IAS/Shunts: No atrial level shunt detected by color flow Doppler. Additional Comments: A device lead is visualized.  LEFT  VENTRICLE PLAX 2D LVIDd:         5.30 cm LVIDs:         2.70 cm LV PW:         1.10 cm LV IVS:        1.10 cm LVOT diam:     1.80 cm LV SV:         83 LV SV Index:   38 LVOT Area:     2.54 cm  RIGHT VENTRICLE          IVC RV Basal diam:  3.00 cm  IVC diam: 1.30 cm LEFT ATRIUM             Index        RIGHT ATRIUM           Index LA diam:        4.20 cm 1.93 cm/m   RA Area:     17.40 cm LA Vol (A2C):   72.2 ml 33.17 ml/m  RA Volume:   44.30 ml  20.36 ml/m LA Vol (A4C):   62.9 ml 28.90 ml/m LA Biplane Vol: 67.2 ml 30.88 ml/m  AORTIC VALVE                  PULMONIC VALVE AV Area (Vmax): 2.39 cm      PV Vmax:       0.94 m/s AV Vmax:        175.60 cm/s   PV Peak grad:  3.6 mmHg AV Peak Grad:   12.3 mmHg LVOT Vmax:      165.00 cm/s LVOT Vmean:     109.500 cm/s LVOT VTI:       0.327 m  AORTA Ao Root diam: 3.50 cm Ao Asc diam:  3.60 cm  SHUNTS Systemic VTI:  0.33 m Systemic Diam: 1.80 cm Kirk Ruths MD Electronically signed by Kirk Ruths MD Signature Date/Time: 12/10/2021/3:56:22 PM    Final    DG Chest Port 1 View  Result Date: 12/10/2021 CLINICAL DATA:  Shortness of breath. EXAM: PORTABLE CHEST 1 VIEW COMPARISON:  Chest radiograph dated 05/27/2020. FINDINGS: Mild cardiomegaly with mild vascular congestion. No focal consolidation, pleural effusion, or pneumothorax. Left pectoral pacemaker device. Atherosclerotic calcification of the aortic arch. No acute osseous pathology. IMPRESSION: Mild cardiomegaly with mild vascular congestion. Electronically Signed   By: Anner Crete M.D.   On: 12/10/2021 00:50    Cardiac Studies   TTE 12/10/2021  1. Left ventricular ejection fraction, by estimation, is 60 to 65%. The  left ventricle has normal function. The left ventricle has no regional  wall motion abnormalities.  Left ventricular diastolic parameters are  indeterminate.   2. Right ventricular systolic function is normal. The right ventricular  size is normal.   3. The mitral valve is normal in structure. Trivial mitral valve  regurgitation. No evidence of mitral stenosis.   4. The aortic valve is tricuspid. Aortic valve regurgitation is not  visualized. No aortic stenosis is present.   5. The inferior vena cava is normal in size with greater than 50%  respiratory variability, suggesting right atrial pressure of 3 mmHg.   Patient Profile     76 y.o. female with a history of atrial fibrillation, sick sinus syndrome status post pacemaker presented to the hospital with shortness of breath  Assessment & Plan    1.  Shortness of breath: It is unclear to me as to why she is short of breath.  She has mild crackles  on lung exam today, though she does not appear volume overloaded on exam otherwise and her echo shows a right atrial pressure of 3 mmHg.  She has diuresed well and her creatinine has remained stable.  Agree with continued diuresis, though other causes of shortness of breath could also be investigated.  2.  Persistent atrial fibrillation: We Aizah Gehlhausen continue Eliquis 5 mg twice daily, dofetilide 250 mcg twice daily.  Would keep potassium greater than 4, magnesium greater than 2 with diuresis.  3.  Hypokalemia: We Braleigh Massoud replete potassium today.  We Jamyiah Labella check magnesium level tomorrow.  4.  Sick sinus syndrome: Status post pacemaker.  Device functioning appropriately.  No changes.       For questions or updates, please contact [place] Please consult [email or web address] for contact info under        Signed, Jaryn Hocutt Meredith Leeds, MD  12/11/2021, 7:38 AM

## 2021-12-11 NOTE — Progress Notes (Signed)
PROGRESS NOTE    [name]  WJX:[id number] DOB: April 10, 1946 DOA: 12/10/2021 PCP: [name], MD  76/F history of chronic diastolic CHF, paroxysmal A-fib on Eliquis, tachybradycardia syndrome with PPM, sleep apnea, CAD, hypertension, dyslipidemia and obesity presented to the ED with dyspnea on exertion, orthopnea and leg swelling, she is compliant with Lasix and metolazone. -In the ED she was noted to be hypoxic with sats in the 80s required nonrebreather mask initially, subsequently weaned down to 6 to 7 L O2, BNP was 122, troponin was 4, chest x-ray noted mild pulmonary vascular congestion -Admitted, started on diuretics, cards following  Subjective: -Breathing improving, urinated good amounts yesterday  Assessment and Plan:  Acute hypoxic respiratory failure Acute on chronic diastolic CHF -?  Severe PAH noted on right heart cath 2018 -2D echo with EF of 60-65%, indeterminate diastolic parameters, RV size and function is normal -Clinically improving, continue IV Lasix 60 Mg twice daily, empagliflozin -Add low-dose Aldactone tomorrow if kidney function is stable -Cardiology following -Clinically do not suspect PE while on anticoagulation -Wean O2, anticipate need for home O2 at discharge, reports that its been borderline low last few hospitalizations  Hyperkalemia, corrected now hypokalemic -Supplement   Paroxysmal atrial fibrillation on anticoagulation tachybradycardia syndrome s/p permanent pacemaker Patient appears to be rate controlled at this time.  Status post multiple ablations.  Last device interrogation noted normal function 10/06/2021.   -Continue Tikosyn and Eliquis -Keep K> 4 and mag> 2   CAD Patient with prior PCI.  Noted to have coronary CTA back in 06/2020 -Continue anticoagulation and statin   Diabetes mellitus type 2, with hyperlipidemia - Last hemoglobin A1c 8 on 08/23/21.  Home medication regimen includes metformin 1000 mg twice daily, Jardiance 25 mg  daily -Hold metformin, -Continue Jardiance -CBGs are stable   Essential Hypertension -Continue current med regimen as tolerated   Transaminitis  Chronic. On admission AST 73 and ALT 65 intermittently elevated in there past.   Chronic kidney disease stage IIIb Patient presents with creatinine of 1.35 with BUN 31.  Baseline creatinine appears to range from 1.1-1.4. -Continue to monitor with diuresis   OSA -Continue CPAP at night     Obesity BMI 39.39 kg/m   GERD -Continue pharmacy substitution of Prilosec   DVT prophylaxis: Eliquis Advance Care Planning:   Code Status: Full Code  Family communication: Discussed patient detail, no family at bedside Disposition Plan: Home likely 2 to 3 days  Consultants: Cardiology   Procedures:   Antimicrobials:    Objective: Vitals:   12/11/21 0557 12/11/21 0600 12/11/21 0645 12/11/21 0759  BP: (!) 107/43   (!) 110/50  Pulse: 66 66  66  Resp: (!) '24 20  17  '$ Temp: 98.5 F (36.9 C)   97.9 F (36.6 C)  TempSrc: Oral   Oral  SpO2: 90% 93%  93%  Weight: 108.7 kg  108.4 kg   Height:        Intake/Output Summary (Last 24 hours) at 12/11/2021 1020 Last data filed at 12/11/2021 0800 Gross per 24 hour  Intake 160 ml  Output 1800 ml  Net -1640 ml   Filed Weights   12/10/21 1833 12/11/21 0557 12/11/21 0645  Weight: 109.8 kg 108.7 kg 108.4 kg    Examination:  General exam: Obese pleasant female sitting up in bed, AAOx3, no distress HEENT: Positive JVD CVS: S1-S2, regular rhythm Lungs: Few basilar rales Abdomen: Soft, nontender, bowel sounds present Extremities: 1+ edema Skin: No rashes Psychiatry:  Mood &  affect appropriate.   Data Reviewed:   CBC: Recent Labs  Lab 12/10/21 0022 12/11/21 0319  WBC 15.0* 12.5*  HGB 14.4 14.4  HCT 44.7 45.5  MCV 96.8 97.8  PLT 274 914   Basic Metabolic Panel: Recent Labs  Lab 12/10/21 0022 12/11/21 0319  NA 139 141  K 5.3* 3.4*  CL 100 97*  CO2 26 34*  GLUCOSE 203* 165*   BUN 31* 16  CREATININE 1.35* 1.30*  CALCIUM 9.4 9.2   GFR: Estimated Creatinine Clearance: 46.6 mL/min (A) (by C-G formula based on SCr of 1.3 mg/dL (H)). Liver Function Tests: Recent Labs  Lab 12/10/21 0022 12/11/21 0319  AST 73* 28  ALT 65* 47*  ALKPHOS 88 84  BILITOT 1.1 1.1  PROT 6.9 6.9  ALBUMIN 3.6 3.4*   No results for input(s): "LIPASE", "AMYLASE" in the last 168 hours. No results for input(s): "AMMONIA" in the last 168 hours. Coagulation Profile: No results for input(s): "INR", "PROTIME" in the last 168 hours. Cardiac Enzymes: No results for input(s): "CKTOTAL", "CKMB", "CKMBINDEX", "TROPONINI" in the last 168 hours. BNP (last 3 results) Recent Labs    07/08/21 1221  PROBNP 523   HbA1C: No results for input(s): "HGBA1C" in the last 72 hours. CBG: Recent Labs  Lab 12/10/21 1216 12/10/21 1654 12/10/21 2058 12/11/21 0607  GLUCAP 193* 173* 170* 154*   Lipid Profile: No results for input(s): "CHOL", "HDL", "LDLCALC", "TRIG", "CHOLHDL", "LDLDIRECT" in the last 72 hours. Thyroid Function Tests: No results for input(s): "TSH", "T4TOTAL", "FREET4", "T3FREE", "THYROIDAB" in the last 72 hours. Anemia Panel: No results for input(s): "VITAMINB12", "FOLATE", "FERRITIN", "TIBC", "IRON", "RETICCTPCT" in the last 72 hours. Urine analysis:    Component Value Date/Time   COLORURINE STRAW (A) 06/26/2020 1013   APPEARANCEUR CLEAR 06/26/2020 1013   LABSPEC 1.006 06/26/2020 1013   PHURINE 7.0 06/26/2020 1013   GLUCOSEU 150 (A) 06/26/2020 1013   HGBUR NEGATIVE 06/26/2020 1013   HGBUR negative 06/01/2010 0841   BILIRUBINUR Negative 04/20/2021 1120   KETONESUR NEGATIVE 06/26/2020 1013   PROTEINUR Negative 08/23/2021 0930   PROTEINUR NEGATIVE 06/26/2020 1013   UROBILINOGEN 0.2 08/23/2021 0930   UROBILINOGEN 0.2 06/01/2010 0841   NITRITE neg 08/23/2021 0930   NITRITE NEGATIVE 06/26/2020 1013   LEUKOCYTESUR Negative 08/23/2021 0930   LEUKOCYTESUR NEGATIVE 06/26/2020  1013   Sepsis Labs: '@LABRCNTIP'$ (procalcitonin:4,lacticidven:4)  )No results found for this or any previous visit (from the past 240 hour(s)).   Radiology Studies: ECHOCARDIOGRAM COMPLETE  Result Date: 12/10/2021    ECHOCARDIOGRAM REPORT   Patient Name:   BENNETT VANSCYOC Date of Exam: 12/10/2021 Medical Rec #:  [id number]    Height:       66.0 in Accession #:    [id number]   Weight:       244.0 lb Date of Birth:  Aug 07, 1945     BSA:          2.176 m Patient Age:    37 years     BP:           135/57 mmHg Patient Gender: F            HR:           86 bpm. Exam Location:  Inpatient Procedure: 2D Echo, Cardiac Doppler and Color Doppler Indications:    CHF  History:        Patient has prior history of Echocardiogram examinations, most  recent 06/01/2018. CAD, Arrythmias:Atrial Fibrillation; Risk                 Factors:Sleep Apnea, Diabetes and Hypertension.  Sonographer:    Jefferey Pica Referring Phys: [id number] RONDELL A SMITH IMPRESSIONS  1. Left ventricular ejection fraction, by estimation, is 60 to 65%. The left ventricle has normal function. The left ventricle has no regional wall motion abnormalities. Left ventricular diastolic parameters are indeterminate.  2. Right ventricular systolic function is normal. The right ventricular size is normal.  3. The mitral valve is normal in structure. Trivial mitral valve regurgitation. No evidence of mitral stenosis.  4. The aortic valve is tricuspid. Aortic valve regurgitation is not visualized. No aortic stenosis is present.  5. The inferior vena cava is normal in size with greater than 50% respiratory variability, suggesting right atrial pressure of 3 mmHg. FINDINGS  Left Ventricle: Left ventricular ejection fraction, by estimation, is 60 to 65%. The left ventricle has normal function. The left ventricle has no regional wall motion abnormalities. The left ventricular internal cavity size was normal in size. There is  no left ventricular hypertrophy. Left  ventricular diastolic parameters are indeterminate. Right Ventricle: The right ventricular size is normal. Right ventricular systolic function is normal. Left Atrium: Left atrial size was normal in size. Right Atrium: Right atrial size was normal in size. Pericardium: There is no evidence of pericardial effusion. Mitral Valve: The mitral valve is normal in structure. Mild mitral annular calcification. Trivial mitral valve regurgitation. No evidence of mitral valve stenosis. Tricuspid Valve: The tricuspid valve is normal in structure. Tricuspid valve regurgitation is trivial. No evidence of tricuspid stenosis. Aortic Valve: The aortic valve is tricuspid. Aortic valve regurgitation is not visualized. No aortic stenosis is present. Aortic valve peak gradient measures 12.3 mmHg. Pulmonic Valve: The pulmonic valve was not well visualized. Pulmonic valve regurgitation is not visualized. No evidence of pulmonic stenosis. Aorta: The aortic root is normal in size and structure. Venous: The inferior vena cava is normal in size with greater than 50% respiratory variability, suggesting right atrial pressure of 3 mmHg. IAS/Shunts: No atrial level shunt detected by color flow Doppler. Additional Comments: A device lead is visualized.  LEFT VENTRICLE PLAX 2D LVIDd:         5.30 cm LVIDs:         2.70 cm LV PW:         1.10 cm LV IVS:        1.10 cm LVOT diam:     1.80 cm LV SV:         83 LV SV Index:   38 LVOT Area:     2.54 cm  RIGHT VENTRICLE          IVC RV Basal diam:  3.00 cm  IVC diam: 1.30 cm LEFT ATRIUM             Index        RIGHT ATRIUM           Index LA diam:        4.20 cm 1.93 cm/m   RA Area:     17.40 cm LA Vol (A2C):   72.2 ml 33.17 ml/m  RA Volume:   44.30 ml  20.36 ml/m LA Vol (A4C):   62.9 ml 28.90 ml/m LA Biplane Vol: 67.2 ml 30.88 ml/m  AORTIC VALVE                  PULMONIC VALVE AV Area (  Vmax): 2.39 cm      PV Vmax:       0.94 m/s AV Vmax:        175.60 cm/s   PV Peak grad:  3.6 mmHg AV Peak Grad:    12.3 mmHg LVOT Vmax:      165.00 cm/s LVOT Vmean:     109.500 cm/s LVOT VTI:       0.327 m  AORTA Ao Root diam: 3.50 cm Ao Asc diam:  3.60 cm  SHUNTS Systemic VTI:  0.33 m Systemic Diam: 1.80 cm Kirk Ruths MD Electronically signed by Kirk Ruths MD Signature Date/Time: 12/10/2021/3:56:22 PM    Final    DG Chest Port 1 View  Result Date: 12/10/2021 CLINICAL DATA:  Shortness of breath. EXAM: PORTABLE CHEST 1 VIEW COMPARISON:  Chest radiograph dated 05/27/2020. FINDINGS: Mild cardiomegaly with mild vascular congestion. No focal consolidation, pleural effusion, or pneumothorax. Left pectoral pacemaker device. Atherosclerotic calcification of the aortic arch. No acute osseous pathology. IMPRESSION: Mild cardiomegaly with mild vascular congestion. Electronically Signed   By: Anner Crete M.D.   On: 12/10/2021 00:50     Scheduled Meds:  apixaban  5 mg Oral BID   atorvastatin  40 mg Oral Daily   [START ON 12/12/2021] chlorhexidine  15 mL Mouth Rinse BID   diltiazem  360 mg Oral Daily   dofetilide  250 mcg Oral BID   DULoxetine  60 mg Oral Daily   empagliflozin  25 mg Oral Daily   furosemide  60 mg Intravenous BID   insulin aspart  0-15 Units Subcutaneous TID WC   mouth rinse  15 mL Mouth Rinse q12n4p   metoprolol tartrate  50 mg Oral q morning   And   metoprolol tartrate  100 mg Oral QHS   pantoprazole  40 mg Oral BID   polyvinyl alcohol  1 drop Both Eyes QHS   potassium chloride  20 mEq Oral Q1 Hr x 3   potassium chloride  40 mEq Oral BID   sodium chloride flush  3 mL Intravenous Q12H   Continuous Infusions:   LOS: 1 day    Time spent: 43mn  PDomenic Polite MD [facility]   12/11/2021, 10:20 AM

## 2021-12-12 ENCOUNTER — Ambulatory Visit: Payer: Medicare Other | Admitting: Primary Care

## 2021-12-12 DIAGNOSIS — G4733 Obstructive sleep apnea (adult) (pediatric): Secondary | ICD-10-CM | POA: Diagnosis not present

## 2021-12-12 DIAGNOSIS — I480 Paroxysmal atrial fibrillation: Secondary | ICD-10-CM | POA: Diagnosis not present

## 2021-12-12 DIAGNOSIS — I5033 Acute on chronic diastolic (congestive) heart failure: Secondary | ICD-10-CM | POA: Diagnosis not present

## 2021-12-12 DIAGNOSIS — I2510 Atherosclerotic heart disease of native coronary artery without angina pectoris: Secondary | ICD-10-CM | POA: Diagnosis not present

## 2021-12-12 LAB — BASIC METABOLIC PANEL
Anion gap: 13 (ref 5–15)
BUN: 22 mg/dL (ref 8–23)
CO2: 25 mmol/L (ref 22–32)
Calcium: 8.7 mg/dL — ABNORMAL LOW (ref 8.9–10.3)
Chloride: 99 mmol/L (ref 98–111)
Creatinine, Ser: 1.43 mg/dL — ABNORMAL HIGH (ref 0.44–1.00)
GFR, Estimated: 38 mL/min — ABNORMAL LOW (ref >60)
Glucose, Bld: 174 mg/dL — ABNORMAL HIGH (ref 70–99)
Potassium: 4.2 mmol/L (ref 3.5–5.1)
Sodium: 137 mmol/L (ref 135–145)

## 2021-12-12 LAB — GLUCOSE, CAPILLARY
Glucose-Capillary: 171 mg/dL — ABNORMAL HIGH (ref 70–99)
Glucose-Capillary: 171 mg/dL — ABNORMAL HIGH (ref 70–99)
Glucose-Capillary: 152 mg/dL — ABNORMAL HIGH (ref 70–99)
Glucose-Capillary: 159 mg/dL — ABNORMAL HIGH (ref 70–99)

## 2021-12-12 LAB — MAGNESIUM: Magnesium: 2.4 mg/dL (ref 1.7–2.4)

## 2021-12-12 MED ORDER — FUROSEMIDE 10 MG/ML IJ SOLN: 80.0000 mg | Freq: Two times a day (BID) | INTRAMUSCULAR | Status: DC

## 2021-12-12 MED ORDER — FUROSEMIDE 10 MG/ML IJ SOLN
80.0000 mg | Freq: Two times a day (BID) | INTRAMUSCULAR | Status: DC
  Administered 2021-12-12 – 2021-12-13 (×3): 80 mg via INTRAVENOUS
  Filled 2021-12-12 (×3): qty 8

## 2021-12-12 NOTE — Progress Notes (Signed)
Pt will self administer home CPAP when she is ready for rest.

## 2021-12-12 NOTE — Progress Notes (Signed)
Mobility Specialist: Progress Note   12/12/21 1610  Mobility  Activity Ambulated with assistance in hallway  Level of Assistance Contact guard assist, steadying assist  Assistive Device Other (Comment) (HHA)  Distance Ambulated (ft) 470 ft  Activity Response Tolerated well  $Mobility charge 1 Mobility   Pre-Mobility on 7 L/min [place]: 67 HR, 91% SpO2 During Mobility on 8 L/min [place]: 89-91% SpO2 Post-Mobility on 7 L/min [place]: 76 HR, 93% SpO2  Pt received in the chair and agreeable to ambulation. Ambulated on 8 L/min [place]. Stopped x2 with c/o SOB, otherwise asymptomatic. Pt back to the chair after session with call bell and phone in reach.   [facility] [name] Mobility Specialist Mobility Specialist [address]: [phone]

## 2021-12-12 NOTE — Progress Notes (Addendum)
Progress Note  Patient Name: [name] Date of Encounter: 12/12/2021  Primary Cardiologist: Mertie Moores, MD   Subjective   Overnight has improved weight and SOB. Patient notes that she feels better with diuresis No CP, Palpitations. Has been stuck in the bed since Friday, more or less, because she is a fall risk.  Inpatient Medications    Scheduled Meds:  apixaban  5 mg Oral BID   atorvastatin  40 mg Oral Daily   chlorhexidine  15 mL Mouth Rinse BID   diltiazem  360 mg Oral Daily   dofetilide  250 mcg Oral BID   DULoxetine  60 mg Oral Daily   empagliflozin  25 mg Oral Daily   furosemide  60 mg Intravenous BID   insulin aspart  0-15 Units Subcutaneous TID WC   mouth rinse  15 mL Mouth Rinse q12n4p   metoprolol tartrate  50 mg Oral q morning   And   metoprolol tartrate  100 mg Oral QHS   pantoprazole  40 mg Oral BID   polyvinyl alcohol  1 drop Both Eyes QHS   potassium chloride  40 mEq Oral BID   sodium chloride flush  3 mL Intravenous Q12H   Continuous Infusions:  PRN Meds: acetaminophen **OR** acetaminophen, albuterol, fluticasone, loratadine, nystatin-triamcinolone   Vital Signs    Vitals:   12/11/21 2209 12/12/21 0130 12/12/21 0334 12/12/21 0721  BP: 137/68  119/71 132/70  Pulse: 79  63 69  Resp: (!) '22  19 20  '$ Temp:   98.9 F (37.2 C) 98.3 F (36.8 C)  TempSrc:   Oral Oral  SpO2: 93%  92% 93%  Weight:  108.9 kg    Height:        Intake/Output Summary (Last 24 hours) at 12/12/2021 0901 Last data filed at 12/12/2021 6789 Gross per 24 hour  Intake 250 ml  Output 2550 ml  Net -2300 ml   Filed Weights   12/11/21 0557 12/11/21 0645 12/12/21 0130  Weight: 108.7 kg 108.4 kg 108.9 kg    Telemetry    SR - Personally Reviewed  Physical Exam   Gen: no distress, morbid obesity   Neck: No JVD (supine) Cardiac: No Rubs or Gallops, no Murmur, RRR +2 radial pulses Respiratory: Inspiratory wheezes RUL, crackles bilatearlly, normal effort, normal   respiratory rate GI: Soft, nontender, non-distended  MS: Non pitting edema;  moves all extremities Integument: Skin feels warm Neuro:  At time of evaluation, alert and oriented to person/place/time/situation  Psych: Normal affect, patient feels [facility]   Labs    Chemistry Recent Labs  Lab 12/10/21 0022 12/11/21 0319 12/12/21 0240  NA 139 141 137  K 5.3* 3.4* 4.2  CL 100 97* 99  CO2 26 34* 25  GLUCOSE 203* 165* 174*  BUN 31* 16 22  CREATININE 1.35* 1.30* 1.43*  CALCIUM 9.4 9.2 8.7*  PROT 6.9 6.9  --   ALBUMIN 3.6 3.4*  --   AST 73* 28  --   ALT 65* 47*  --   ALKPHOS 88 84  --   BILITOT 1.1 1.1  --   GFRNONAA 41* 43* 38*  ANIONGAP '13 10 13     '$ Hematology Recent Labs  Lab 12/10/21 0022 12/11/21 0319  WBC 15.0* 12.5*  RBC 4.62 4.65  HGB 14.4 14.4  HCT 44.7 45.5  MCV 96.8 97.8  MCH 31.2 31.0  MCHC 32.2 31.6  RDW 14.5 14.6  PLT 274 262    Cardiac EnzymesNo  results for input(s): "TROPONINI" in the last 168 hours. No results for input(s): "TROPIPOC" in the last 168 hours.   BNP Recent Labs  Lab 12/10/21 0022  BNP 122.2*     DDimer No results for input(s): "DDIMER" in the last 168 hours.   Radiology    ECHOCARDIOGRAM COMPLETE  Result Date: 12/10/2021    ECHOCARDIOGRAM REPORT   Patient Name:   [name] Date of Exam: 12/10/2021 Medical Rec #:  [id number]    Height:       66.0 in Accession #:    [id number]   Weight:       244.0 lb Date of Birth:  06-24-1946     BSA:          2.176 m Patient Age:    76 years     BP:           135/57 mmHg Patient Gender: F            HR:           86 bpm. Exam Location:  Inpatient Procedure: 2D Echo, Cardiac Doppler and Color Doppler Indications:    CHF  History:        Patient has prior history of Echocardiogram examinations, most                 recent 06/01/2018. CAD, Arrythmias:Atrial Fibrillation; Risk                 Factors:Sleep Apnea, Diabetes and Hypertension.  Sonographer:    Jefferey Pica Referring Phys: [id number] RONDELL A  SMITH IMPRESSIONS  1. Left ventricular ejection fraction, by estimation, is 60 to 65%. The left ventricle has normal function. The left ventricle has no regional wall motion abnormalities. Left ventricular diastolic parameters are indeterminate.  2. Right ventricular systolic function is normal. The right ventricular size is normal.  3. The mitral valve is normal in structure. Trivial mitral valve regurgitation. No evidence of mitral stenosis.  4. The aortic valve is tricuspid. Aortic valve regurgitation is not visualized. No aortic stenosis is present.  5. The inferior vena cava is normal in size with greater than 50% respiratory variability, suggesting right atrial pressure of 3 mmHg. FINDINGS  Left Ventricle: Left ventricular ejection fraction, by estimation, is 60 to 65%. The left ventricle has normal function. The left ventricle has no regional wall motion abnormalities. The left ventricular internal cavity size was normal in size. There is  no left ventricular hypertrophy. Left ventricular diastolic parameters are indeterminate. Right Ventricle: The right ventricular size is normal. Right ventricular systolic function is normal. Left Atrium: Left atrial size was normal in size. Right Atrium: Right atrial size was normal in size. Pericardium: There is no evidence of pericardial effusion. Mitral Valve: The mitral valve is normal in structure. Mild mitral annular calcification. Trivial mitral valve regurgitation. No evidence of mitral valve stenosis. Tricuspid Valve: The tricuspid valve is normal in structure. Tricuspid valve regurgitation is trivial. No evidence of tricuspid stenosis. Aortic Valve: The aortic valve is tricuspid. Aortic valve regurgitation is not visualized. No aortic stenosis is present. Aortic valve peak gradient measures 12.3 mmHg. Pulmonic Valve: The pulmonic valve was not well visualized. Pulmonic valve regurgitation is not visualized. No evidence of pulmonic stenosis. Aorta: The aortic root  is normal in size and structure. Venous: The inferior vena cava is normal in size with greater than 50% respiratory variability, suggesting right atrial pressure of 3 mmHg. IAS/Shunts: No atrial level shunt detected by color  flow Doppler. Additional Comments: A device lead is visualized.  LEFT VENTRICLE PLAX 2D LVIDd:         5.30 cm LVIDs:         2.70 cm LV PW:         1.10 cm LV IVS:        1.10 cm LVOT diam:     1.80 cm LV SV:         83 LV SV Index:   38 LVOT Area:     2.54 cm  RIGHT VENTRICLE          IVC RV Basal diam:  3.00 cm  IVC diam: 1.30 cm LEFT ATRIUM             Index        RIGHT ATRIUM           Index LA diam:        4.20 cm 1.93 cm/m   RA Area:     17.40 cm LA Vol (A2C):   72.2 ml 33.17 ml/m  RA Volume:   44.30 ml  20.36 ml/m LA Vol (A4C):   62.9 ml 28.90 ml/m LA Biplane Vol: 67.2 ml 30.88 ml/m  AORTIC VALVE                  PULMONIC VALVE AV Area (Vmax): 2.39 cm      PV Vmax:       0.94 m/s AV Vmax:        175.60 cm/s   PV Peak grad:  3.6 mmHg AV Peak Grad:   12.3 mmHg LVOT Vmax:      165.00 cm/s LVOT Vmean:     109.500 cm/s LVOT VTI:       0.327 m  AORTA Ao Root diam: 3.50 cm Ao Asc diam:  3.60 cm  SHUNTS Systemic VTI:  0.33 m Systemic Diam: 1.80 cm Kirk Ruths MD Electronically signed by Kirk Ruths MD Signature Date/Time: 12/10/2021/3:56:22 PM    Final      Patient Profile     76 y.o. female CAD, HLD with DM, HFpEF, OSA on CPA, Morbid Obesity, HTN with DM, CKD Stage IIIa, SSS s/p PPM and Persistent AF seen for dyspnea  Assessment & Plan    Mixed Hypoxic respiratory failure OSA on CPAP  Heart Failure Preserved Ejection Fraction  - No significant LVH, diastology not performed in AF) HTN with DM CKD Stage IIIa - NYHA class III, Stage C, hypervolemic, etiology mixed picture - Diuretic regimen: Lasix 80 IV (increased today, at home takes metolazone every 4 days 2.5 mg ) - slight bump in creatinine 12/13/21 - Discussed the importance of fluid restriction of < 2 L, salt  restriction, and checking daily weights  -  Replace electrolytes PRN and keep K>4 and Mg>2. - given kidney function and Tikosyn, aggressive diuresis has not been pursued; low threshold for RHC if persistent SOB of unclear etiology - planned to start aldactone low dose; this is reasonable  History of pulmonary hypertension with prior queries of PVOD (not suggestive on 2021 CTPV -low threshold for RHC.  Persistent AF - on 150 mg metoprolol tartrate, Tikoysn 250, and  diltiazem 360 - on eliquis  CAD - s/p RCA PCI in the past - asymptomatic, continue statin  Sick sinus syndrome S/p PPM, no issues  For questions or updates, please contact [facility] Please consult [email or web address] for contact info under Cardiology/STEMI.      Rudean Haskell, MD  Cardiologist [facility] [address] [place], [place] [id number] [phone]  9:01 AM

## 2021-12-12 NOTE — Progress Notes (Addendum)
PROGRESS NOTE    [name]  WJX:[id number] DOB: 20-Jul-1945 DOA: 12/10/2021 PCP: [name], MD  75/F history of chronic diastolic CHF, paroxysmal A-fib on Eliquis, tachybradycardia syndrome with PPM, sleep apnea, CAD, hypertension, dyslipidemia and obesity presented to the ED with dyspnea on exertion, orthopnea and leg swelling, she is compliant with Lasix and metolazone. -In the ED she was noted to be hypoxic with sats in the 80s required nonrebreather mask initially, subsequently weaned down to 6 to 7 L O2, BNP was 122, troponin was 4, chest x-ray noted mild pulmonary vascular congestion -Admitted, started on diuretics, cards following  Subjective: -Slow improvement in breathing only, still requiring significant amounts of supplemental O2  Assessment and Plan:  Acute hypoxic respiratory failure Acute on chronic diastolic CHF -?  Severe PAH noted on right heart cath 2018 -2D echo with EF of 60-65%, indeterminate diastolic parameters, RV size and function is normal -Clinically improving, continue IV Lasix, dose increased to 80 Mg twice daily, empagliflozin, mild bump in creatinine, monitor  -Cardiology following -Wean O2 as tolerated, anticipate need for oxygen at discharge, -Ambulate, PT eval  History of pulmonary hypertension -On right heart cath 2018  Hyperkalemia, corrected now hypokalemic -Supplement   Paroxysmal atrial fibrillation on anticoagulation tachybradycardia syndrome s/p permanent pacemaker Patient appears to be rate controlled at this time.  Status post multiple ablations.  Last device interrogation noted normal function 10/06/2021.   -Continue Tikosyn and Eliquis -Keep K> 4 and mag> 2   CAD Patient with prior PCI to RCA.  Noted to have coronary CTA back in 06/2020 -Continue anticoagulation and statin   Diabetes mellitus type 2, with hyperlipidemia - Last hemoglobin A1c 8 on 08/23/21.  Home medication regimen includes metformin 1000 mg twice daily,  Jardiance 25 mg daily -Hold metformin, -Continue Jardiance -CBGs are stable   Essential Hypertension -Continue current med regimen as tolerated   Chronic kidney disease stage IIIb - baseline creatinine 1.1-1.4, creatinine stable around 1.4 now   OSA -Continue CPAP at night     Obesity BMI 39.39 kg/m   GERD -Continue pharmacy substitution of Prilosec   DVT prophylaxis: Eliquis Code Status: Full Code  Family communication: Discussed patient detail, no family at bedside Disposition Plan: Home likely 3 to 4 days  Consultants: Cardiology   Procedures:   Antimicrobials:    Objective: Vitals:   12/12/21 0130 12/12/21 0334 12/12/21 0721 12/12/21 1003  BP:  119/71 132/70   Pulse:  63 69   Resp:  '19 20 20  '$ Temp:  98.9 F (37.2 C) 98.3 F (36.8 C) 99.1 F (37.3 C)  TempSrc:  Oral Oral Oral  SpO2:  92% 93%   Weight: 108.9 kg     Height:        Intake/Output Summary (Last 24 hours) at 12/12/2021 1135 Last data filed at 12/12/2021 0910 Gross per 24 hour  Intake 490 ml  Output 2400 ml  Net -1910 ml   Filed Weights   12/11/21 0557 12/11/21 0645 12/12/21 0130  Weight: 108.7 kg 108.4 kg 108.9 kg    Examination:  General exam: Obese pleasant female sitting up in bed, AAOx3, no distress HEENT: Positive JVD CVS: S1-S2, regular rhythm Lungs: Few basilar rales Abdomen: Soft, nontender, bowel sounds present Extremities: 1+ edema Skin: No rashes Psychiatry:  Mood & affect appropriate.   Data Reviewed:   CBC: Recent Labs  Lab 12/10/21 0022 12/11/21 0319  WBC 15.0* 12.5*  HGB 14.4 14.4  HCT 44.7 45.5  MCV  96.8 97.8  PLT 274 606   Basic Metabolic Panel: Recent Labs  Lab 12/10/21 0022 12/11/21 0319 12/12/21 0240  NA 139 141 137  K 5.3* 3.4* 4.2  CL 100 97* 99  CO2 26 34* 25  GLUCOSE 203* 165* 174*  BUN 31* 16 22  CREATININE 1.35* 1.30* 1.43*  CALCIUM 9.4 9.2 8.7*  MG  --   --  2.4   GFR: Estimated Creatinine Clearance: 42.4 mL/min (A) (by C-G  formula based on SCr of 1.43 mg/dL (H)). Liver Function Tests: Recent Labs  Lab 12/10/21 0022 12/11/21 0319  AST 73* 28  ALT 65* 47*  ALKPHOS 88 84  BILITOT 1.1 1.1  PROT 6.9 6.9  ALBUMIN 3.6 3.4*   No results for input(s): "LIPASE", "AMYLASE" in the last 168 hours. No results for input(s): "AMMONIA" in the last 168 hours. Coagulation Profile: No results for input(s): "INR", "PROTIME" in the last 168 hours. Cardiac Enzymes: No results for input(s): "CKTOTAL", "CKMB", "CKMBINDEX", "TROPONINI" in the last 168 hours. BNP (last 3 results) Recent Labs    07/08/21 1221  PROBNP 523   HbA1C: No results for input(s): "HGBA1C" in the last 72 hours. CBG: Recent Labs  Lab 12/11/21 1224 12/11/21 1651 12/11/21 2135 12/12/21 0607 12/12/21 1105  GLUCAP 147* 203* 191* 171* 171*   Lipid Profile: No results for input(s): "CHOL", "HDL", "LDLCALC", "TRIG", "CHOLHDL", "LDLDIRECT" in the last 72 hours. Thyroid Function Tests: No results for input(s): "TSH", "T4TOTAL", "FREET4", "T3FREE", "THYROIDAB" in the last 72 hours. Anemia Panel: No results for input(s): "VITAMINB12", "FOLATE", "FERRITIN", "TIBC", "IRON", "RETICCTPCT" in the last 72 hours. Urine analysis:    Component Value Date/Time   COLORURINE STRAW (A) 06/26/2020 1013   APPEARANCEUR CLEAR 06/26/2020 1013   LABSPEC 1.006 06/26/2020 1013   PHURINE 7.0 06/26/2020 1013   GLUCOSEU 150 (A) 06/26/2020 1013   HGBUR NEGATIVE 06/26/2020 1013   HGBUR negative 06/01/2010 0841   BILIRUBINUR Negative 04/20/2021 1120   KETONESUR NEGATIVE 06/26/2020 1013   PROTEINUR Negative 08/23/2021 0930   PROTEINUR NEGATIVE 06/26/2020 1013   UROBILINOGEN 0.2 08/23/2021 0930   UROBILINOGEN 0.2 06/01/2010 0841   NITRITE neg 08/23/2021 0930   NITRITE NEGATIVE 06/26/2020 1013   LEUKOCYTESUR Negative 08/23/2021 0930   LEUKOCYTESUR NEGATIVE 06/26/2020 1013   Sepsis Labs: '@LABRCNTIP'$ (procalcitonin:4,lacticidven:4)  )No results found for this or any  previous visit (from the past 240 hour(s)).   Radiology Studies: ECHOCARDIOGRAM COMPLETE  Result Date: 12/10/2021    ECHOCARDIOGRAM REPORT   Patient Name:   [name] Date of Exam: 12/10/2021 Medical Rec #:  [id number]    Height:       66.0 in Accession #:    [id number]   Weight:       244.0 lb Date of Birth:  [birth year]     BSA:          2.176 m Patient Age:    51 years     BP:           135/57 mmHg Patient Gender: F            HR:           86 bpm. Exam Location:  Inpatient Procedure: 2D Echo, Cardiac Doppler and Color Doppler Indications:    CHF  History:        Patient has prior history of Echocardiogram examinations, most                 recent 06/01/2018. CAD, Arrythmias:Atrial Fibrillation;  Risk                 Factors:Sleep Apnea, Diabetes and Hypertension.  Sonographer:    Jefferey Pica Referring Phys: [id number] RONDELL A SMITH IMPRESSIONS  1. Left ventricular ejection fraction, by estimation, is 60 to 65%. The left ventricle has normal function. The left ventricle has no regional wall motion abnormalities. Left ventricular diastolic parameters are indeterminate.  2. Right ventricular systolic function is normal. The right ventricular size is normal.  3. The mitral valve is normal in structure. Trivial mitral valve regurgitation. No evidence of mitral stenosis.  4. The aortic valve is tricuspid. Aortic valve regurgitation is not visualized. No aortic stenosis is present.  5. The inferior vena cava is normal in size with greater than 50% respiratory variability, suggesting right atrial pressure of 3 mmHg. FINDINGS  Left Ventricle: Left ventricular ejection fraction, by estimation, is 60 to 65%. The left ventricle has normal function. The left ventricle has no regional wall motion abnormalities. The left ventricular internal cavity size was normal in size. There is  no left ventricular hypertrophy. Left ventricular diastolic parameters are indeterminate. Right Ventricle: The right ventricular size is  normal. Right ventricular systolic function is normal. Left Atrium: Left atrial size was normal in size. Right Atrium: Right atrial size was normal in size. Pericardium: There is no evidence of pericardial effusion. Mitral Valve: The mitral valve is normal in structure. Mild mitral annular calcification. Trivial mitral valve regurgitation. No evidence of mitral valve stenosis. Tricuspid Valve: The tricuspid valve is normal in structure. Tricuspid valve regurgitation is trivial. No evidence of tricuspid stenosis. Aortic Valve: The aortic valve is tricuspid. Aortic valve regurgitation is not visualized. No aortic stenosis is present. Aortic valve peak gradient measures 12.3 mmHg. Pulmonic Valve: The pulmonic valve was not well visualized. Pulmonic valve regurgitation is not visualized. No evidence of pulmonic stenosis. Aorta: The aortic root is normal in size and structure. Venous: The inferior vena cava is normal in size with greater than 50% respiratory variability, suggesting right atrial pressure of 3 mmHg. IAS/Shunts: No atrial level shunt detected by color flow Doppler. Additional Comments: A device lead is visualized.  LEFT VENTRICLE PLAX 2D LVIDd:         5.30 cm LVIDs:         2.70 cm LV PW:         1.10 cm LV IVS:        1.10 cm LVOT diam:     1.80 cm LV SV:         83 LV SV Index:   38 LVOT Area:     2.54 cm  RIGHT VENTRICLE          IVC RV Basal diam:  3.00 cm  IVC diam: 1.30 cm LEFT ATRIUM             Index        RIGHT ATRIUM           Index LA diam:        4.20 cm 1.93 cm/m   RA Area:     17.40 cm LA Vol (A2C):   72.2 ml 33.17 ml/m  RA Volume:   44.30 ml  20.36 ml/m LA Vol (A4C):   62.9 ml 28.90 ml/m LA Biplane Vol: 67.2 ml 30.88 ml/m  AORTIC VALVE                  PULMONIC VALVE AV Area (Vmax): 2.39 cm  PV Vmax:       0.94 m/s AV Vmax:        175.60 cm/s   PV Peak grad:  3.6 mmHg AV Peak Grad:   12.3 mmHg LVOT Vmax:      165.00 cm/s LVOT Vmean:     109.500 cm/s LVOT VTI:       0.327 m   AORTA Ao Root diam: 3.50 cm Ao Asc diam:  3.60 cm  SHUNTS Systemic VTI:  0.33 m Systemic Diam: 1.80 cm Kirk Ruths MD Electronically signed by Kirk Ruths MD Signature Date/Time: 12/10/2021/3:56:22 PM    Final      Scheduled Meds:  apixaban  5 mg Oral BID   atorvastatin  40 mg Oral Daily   chlorhexidine  15 mL Mouth Rinse BID   diltiazem  360 mg Oral Daily   dofetilide  250 mcg Oral BID   DULoxetine  60 mg Oral Daily   empagliflozin  25 mg Oral Daily   furosemide  80 mg Intravenous BID   insulin aspart  0-15 Units Subcutaneous TID WC   mouth rinse  15 mL Mouth Rinse q12n4p   metoprolol tartrate  50 mg Oral q morning   And   metoprolol tartrate  100 mg Oral QHS   pantoprazole  40 mg Oral BID   polyvinyl alcohol  1 drop Both Eyes QHS   potassium chloride  40 mEq Oral BID   sodium chloride flush  3 mL Intravenous Q12H   Continuous Infusions:   LOS: 2 days    Time spent: 40mn  PDomenic Polite MD [facility]   12/12/2021, 11:35 AM

## 2021-12-12 NOTE — Evaluation (Signed)
Physical Therapy Evaluation Patient Details Name: [name] MRN: [id number] DOB: [birth year] Today's Date: 12/12/2021  History of Present Illness  76 yo admitted 6/10 with DOE, edema and CHF exacerbation. PMHx:chronic diastolic CHF, paroxysmal A-fib on Eliquis, tachybradycardia syndrome with PPM, sleep apnea, CAD, HTN, HLD and obesity  Clinical Impression  Pt very pleasant and eager to get OOB. Pt reports no falls in the last year and only dizziness with drop in O2 sats PTA. Pt able to transfer to [facility] and walk in hall but required 10L to maintain sats >90%. Pt educated for breathing technique and energy conservation. Pt with decreased activity tolerance and pulmonary function who will benefit from acute therapy to maximize mobility and independence. Encouraged OOB to [facility] and up to chair daily with staff.   93% on 7L at rest 10L for gait with sats 90-94%        Recommendations for follow up therapy are one component of a multi-disciplinary discharge planning process, led by the attending physician.  Recommendations may be updated based on patient status, additional functional criteria and insurance authorization.  Follow Up Recommendations No PT follow up    Assistance Recommended at Discharge PRN  Patient can return home with the following  Help with stairs or ramp for entrance;A little help with bathing/dressing/bathroom;Assistance with cooking/housework    Equipment Recommendations None recommended by PT  Recommendations for Other Services       Functional Status Assessment Patient has had a recent decline in their functional status and demonstrates the ability to make significant improvements in function in a reasonable and predictable amount of time.     Precautions / Restrictions Precautions Precautions: Other (comment) Precaution Comments: watch sats      Mobility  Bed Mobility Overal bed mobility: Modified Independent                  Transfers Overall  transfer level: Modified independent                      Ambulation/Gait Ambulation/Gait assistance: Supervision Gait Distance (Feet): 120 Feet Assistive device: None Gait Pattern/deviations: Step-through pattern, Decreased stride length   Gait velocity interpretation: >2.62 ft/sec, indicative of community ambulatory   General Gait Details: cues for breathing technique and energy conservation  Stairs            Wheelchair Mobility    Modified Rankin (Stroke Patients Only)       Balance Overall balance assessment: No apparent balance deficits (not formally assessed)                                           Pertinent Vitals/Pain Pain Assessment Pain Assessment: No/denies pain    Home Living Family/patient expects to be discharged to:: Private residence Living Arrangements: Spouse/significant other Available Help at Discharge: Family;Available 24 hours/day Type of Home: House Home Access: Ramped entrance     Alternate Level Stairs-Number of Steps: flight Home Layout: Two level;Able to live on main level with bedroom/bathroom Home Equipment: Rolling Walker (2 wheels);Cane - single point;Crutches;Grab bars - tub/shower;Grab bars - toilet      Prior Function Prior Level of Function : Independent/Modified Independent                     Hand Dominance        Extremity/Trunk Assessment   Upper Extremity  Assessment Upper Extremity Assessment: Overall WFL for tasks assessed    Lower Extremity Assessment Lower Extremity Assessment: Overall WFL for tasks assessed    Cervical / Trunk Assessment Cervical / Trunk Assessment: Normal  Communication   Communication: No difficulties  Cognition Arousal/Alertness: Awake/alert Behavior During Therapy: WFL for tasks assessed/performed Overall Cognitive Status: Within Functional Limits for tasks assessed                                          General Comments       Exercises     Assessment/Plan    PT Assessment Patient needs continued PT services  PT Problem List Cardiopulmonary status limiting activity;Decreased activity tolerance;Decreased mobility       PT Treatment Interventions Gait training;Stair training;Functional mobility training;Therapeutic activities;Patient/family education    PT Goals (Current goals can be found in the Care Plan section)  Acute Rehab PT Goals Patient Stated Goal: return home, read PT Goal Formulation: With patient Time For Goal Achievement: 12/26/21 Potential to Achieve Goals: Good    Frequency Min 2X/week     Co-evaluation               AM-PAC PT "6 Clicks" Mobility  Outcome Measure Help needed turning from your back to your side while in a flat bed without using bedrails?: None Help needed moving from lying on your back to sitting on the side of a flat bed without using bedrails?: None Help needed moving to and from a bed to a chair (including a wheelchair)?: None Help needed standing up from a chair using your arms (e.g., wheelchair or bedside chair)?: None Help needed to walk in hospital room?: A Little Help needed climbing 3-5 steps with a railing? : A Lot 6 Click Score: 21    End of Session Equipment Utilized During Treatment: Oxygen Activity Tolerance: Patient tolerated treatment well Patient left: in chair;with call bell/phone within reach Nurse Communication: Mobility status PT Visit Diagnosis: Other abnormalities of gait and mobility (R26.89)    Time: 3845-3646 PT Time Calculation (min) (ACUTE ONLY): 38 min   Charges:   PT Evaluation $PT Eval Moderate Complexity: 1 Mod PT Treatments $Gait Training: 8-22 mins        Bayard Males, PT Acute Rehabilitation Services Office: [facility] 12/12/2021, 2:13 PM

## 2021-12-13 ENCOUNTER — Inpatient Hospital Stay (HOSPITAL_COMMUNITY): Payer: Medicare Other

## 2021-12-13 ENCOUNTER — Encounter (HOSPITAL_COMMUNITY): Payer: Self-pay | Admitting: Internal Medicine

## 2021-12-13 ENCOUNTER — Ambulatory Visit: Payer: PPO

## 2021-12-13 DIAGNOSIS — I2510 Atherosclerotic heart disease of native coronary artery without angina pectoris: Secondary | ICD-10-CM | POA: Diagnosis not present

## 2021-12-13 DIAGNOSIS — I5033 Acute on chronic diastolic (congestive) heart failure: Secondary | ICD-10-CM | POA: Diagnosis not present

## 2021-12-13 DIAGNOSIS — G4733 Obstructive sleep apnea (adult) (pediatric): Secondary | ICD-10-CM | POA: Diagnosis not present

## 2021-12-13 DIAGNOSIS — Z950 Presence of cardiac pacemaker: Secondary | ICD-10-CM | POA: Diagnosis not present

## 2021-12-13 LAB — BASIC METABOLIC PANEL
Anion gap: 10 (ref 5–15)
BUN: 24 mg/dL — ABNORMAL HIGH (ref 8–23)
CO2: 29 mmol/L (ref 22–32)
Calcium: 8.7 mg/dL — ABNORMAL LOW (ref 8.9–10.3)
Chloride: 99 mmol/L (ref 98–111)
Creatinine, Ser: 1.25 mg/dL — ABNORMAL HIGH (ref 0.44–1.00)
GFR, Estimated: 45 mL/min — ABNORMAL LOW (ref >60)
Glucose, Bld: 137 mg/dL — ABNORMAL HIGH (ref 70–99)
Potassium: 4 mmol/L (ref 3.5–5.1)
Sodium: 138 mmol/L (ref 135–145)

## 2021-12-13 LAB — GLUCOSE, CAPILLARY
Glucose-Capillary: 166 mg/dL — ABNORMAL HIGH (ref 70–99)
Glucose-Capillary: 172 mg/dL — ABNORMAL HIGH (ref 70–99)
Glucose-Capillary: 146 mg/dL — ABNORMAL HIGH (ref 70–99)
Glucose-Capillary: 168 mg/dL — ABNORMAL HIGH (ref 70–99)

## 2021-12-13 MED ORDER — FUROSEMIDE 10 MG/ML IJ SOLN
100.0000 mg | Freq: Two times a day (BID) | INTRAVENOUS | Status: DC
  Administered 2021-12-13 – 2021-12-14 (×2): 100 mg via INTRAVENOUS
  Filled 2021-12-13 (×4): qty 10

## 2021-12-13 NOTE — Progress Notes (Signed)
Heart Failure Nurse Navigator Progress Note  PCP: [name], [name], MD PCP-Cardiologist: [place] Admission Diagnosis: Acute respiratory failure with hypoxia, Acute on chronic diastolic congestive heart failure.  Admitted from: Home via EMS  Presentation:   [name] presented with shortness of breath x 2 weeks, bilateral lower leg edema, was taking extra lasix, however not helping, until she started to feel dizzy and called EMS, placed on NRB. BP 130/53, HR 69, Tachypnea, 1+ pitting edema, BNP 122, CXR showed Mild cardiomegaly with mild vascular congestion. Given Lasix 60 mg IV x!, admitted.   Patient educated on sign and symptoms of heart failure, Patient states she weighs herself daily, takes all medications as prescribed, watches her diet, however will need to cut back on her fluid amount, including going down to 1 soda a day. She was very interactive in education, asked a number of questions and will do a hospital follow up with HF TOC on 12/26/21 @ 9 am.   ECHO/ LVEF: 60-65%   Clinical Course:  Past Medical History:  Diagnosis Date   Anticoagulant long-term use    pradaxa   Anxiety    Arthritis    "fingers, lower back" (04/23/2017    CAD (coronary artery disease) [id number]   post PTCA with bare-metal stenting to mid RCA in December 2004      CHF (congestive heart failure) (HCC)    Chronic atrial fibrillation ([place]) 06/2007   Tachybradycardia pacemaker   Chronic kidney disease    10% function - ?R, other kidney is compensating     CVA (cerebral vascular accident) ([place]) [phone]   denies residual on 04/23/2017   Depression    Diplopia 06/19/2008   Qualifier: Diagnosis of  By: [name] MD, [name]    Dysrhythmia    ATRIAL FIBRILATION   Edema of lower extremity    Hyperlipidemia    Hypertension    Inferior myocardial infarction ([place])    acute inferior wall mi/other medical hx   Myocardial infarction [facility]) [phone]   Obesity    OSA on CPAP    last test- 2010   Pacemaker     Pneumonia 2014   tx. ----  [facility]   Pulmonary hypertension ([place])    moderate pulmonary hypertension by 10/2016 echo and 10/2013 cardiac cath   Shortness of breath    Skin cancer    "cut off right Steward; burned off LLE" (04/23/2017)   Sleep apnea    Spondylolisthesis    TIA (transient ischemic attack) 2008   Unspecified hemorrhoids without mention of complication 10/19/6220   Colonoscopy--Dr. Carlean Purl      Social History   Socioeconomic History   Marital status: Married    Spouse name: Diplomatic Services operational officer   Number of children: 0   Years of education: HS   Highest education level: High school graduate  Occupational History   Occupation: retired    Comment: previously worked [employer]  Tobacco Use   Smoking status: Former    Packs/day: 1.00    Years: 5.00    Total pack years: 5.00    Types: Cigarettes    Start date: 05/11/1978    Quit date: 07/03/1982    Years since quitting: 39.4   Smokeless tobacco: Never  Vaping Use   Vaping Use: Never used  Substance and Sexual Activity   Alcohol use: Yes   Drug use: No   Sexual activity: Not Currently  Other Topics Concern   Not on file  Social History Narrative  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   Married    Originally from [place] of two, high school education   Former smoker 1979-1984 1ppd   Hunting dogs 7    Retired from [place] 2    Social Determinants of Radio broadcast assistant Strain: [place]  (12/13/2021)   Overall Financial Resource Strain (CARDIA)    Difficulty of Paying Living Expenses: Not very hard  Food Insecurity: No Food Insecurity (12/13/2021)   Hunger Vital Sign    Worried About Running Out of Food in the Last Year: Never true    Ran Out of Food in the Last Year: Never true  Transportation Needs: No Transportation Needs (12/13/2021)   PRAPARE - Hydrologist (Medical): No    Lack of Transportation  (Non-Medical): No  Physical Activity: Unknown (11/30/2020)   Exercise Vital Sign    Days of Exercise per Week: Not on file    Minutes of Exercise per Session: 20 min  Stress: Stress Concern Present (11/30/2020)   [place]    Feeling of Stress : To some extent  Social Connections: Socially Integrated (11/30/2020)   Social Connection and Isolation Panel [NHANES]    Frequency of Communication with Friends and Family: More than three times a week    Frequency of Social Gatherings with Friends and Family: More than three times a week    Attends Religious Services: 1 to 4 times per year    Active Member of [employer] or Organizations: Yes    Attends Archivist Meetings: 1 to 4 times per year    Marital Status: Married   Teacher, early years/pre and Provision:  Detailed education and instructions provided on heart failure disease management including the following:  Signs and symptoms of Heart Failure When to call the physician Importance of daily weights Low sodium diet Fluid restriction Medication management Anticipated future follow-up appointments  Patient education given on each of the above topics.  Patient acknowledges understanding via teach back method and acceptance of all instructions.  Education Materials:  "Living Better With Heart Failure" Booklet, HF zone tool, & Daily Weight Tracker Tool.  Patient has scale at home: yes,  Patient has pill box at home: yes    High Risk Criteria for Readmission and/or Poor Patient Outcomes: Heart failure hospital admissions (last 6 months): 1  No Show rate: 2 % Difficult social situation: No Demonstrates medication adherence: Yes Primary Language: English Literacy level: reading, writing, and comprehension  Barriers of Care:   Diet/ fluid restrictions  Considerations/Referrals:   Referral made to Heart Failure Pharmacist Stewardship: Yes, Cost of newer  meds Referral made to Heart Failure CSW/NCM TOC: No Referral made to Heart & Vascular TOC clinic: Yes, 12/26/21 @ 9 am   Items for Follow-up on DC/TOC: Diet/ fluid restrictions Pharm: follow up on newer medication costs.    Earnestine Leys, BSN, Clinical cytogeneticist Only

## 2021-12-13 NOTE — Progress Notes (Signed)
Mobility Specialist Progress Note:   12/13/21 1603  Mobility  Activity Ambulated with assistance in hallway  Level of Assistance Standby assist, set-up cues, supervision of patient - no hands on  Assistive Device None  Distance Ambulated (ft) 500 ft  Activity Response Tolerated well  $Mobility charge 1 Mobility   Pt received in bed willing to participate in mobility. No complaints of pain. Left in bed with call bell in reach and all needs met.   [facility] Javontae Marlette Mobility Specialist

## 2021-12-13 NOTE — Progress Notes (Signed)
Mobility Specialist Progress Note:   12/13/21 1013  Mobility  Activity Ambulated with assistance in hallway  Level of Assistance Standby assist, set-up cues, supervision of patient - no hands on  Assistive Device None  Distance Ambulated (ft) 500 ft  Activity Response Tolerated well  $Mobility charge 1 Mobility   Pt received in bed willing to participate in mobility. No complaints of pain. Left at sink with NT to get washed up.   [facility] Librado Guandique Mobility Specialist

## 2021-12-13 NOTE — Progress Notes (Signed)
PROGRESS NOTE    [name]  HMC:[id number] DOB: 12/28/1945 DOA: 12/10/2021 PCP: [name], MD  75/F history of chronic diastolic CHF, paroxysmal A-fib on Eliquis, tachybradycardia syndrome with PPM, sleep apnea, CAD, hypertension, dyslipidemia and obesity presented to the ED with dyspnea on exertion, orthopnea and leg swelling, she is compliant with Lasix and metolazone. -In the ED she was noted to be hypoxic with sats in the 80s required nonrebreather mask initially, subsequently weaned down to 6 to 7 L O2, BNP was 122, troponin was 4, chest x-ray noted mild pulmonary vascular congestion -Admitted, started on diuretics, cards following  Subjective: -Some improvement reported in breathing, still requiring significant amounts of supplemental O2  Assessment and Plan:  Acute hypoxic respiratory failure Acute on chronic diastolic CHF -?  Severe PAH noted on right heart cath 2018 -2D echo with EF of 60-65%, indeterminate diastolic parameters, RV size and function is normal -Clinically improving,, she is 5.4 L negative clinically improving, still requiring 7 to 8 L of O2, CT chest from 4/22 with some findings of?  Pulmonary fibrosis, will check HRCT, continue IV Lasix and empagliflozin -Cardiology following -Wean O2 as tolerated, anticipate need for oxygen at discharge, incentive spirometry -Ambulate,   History of pulmonary hypertension -On right heart cath 2018, echo this admission without suggestion  Hyperkalemia, corrected now hypokalemic -Supplemented   Paroxysmal atrial fibrillation on anticoagulation tachybradycardia syndrome s/p permanent pacemaker Patient appears to be rate controlled at this time.  Status post multiple ablations.  Last device interrogation noted normal function 10/06/2021.   -Continue Tikosyn and Eliquis -Keep K> 4 and mag> 2   CAD Patient with prior PCI to RCA.  Noted to have coronary CTA back in 06/2020 -Continue anticoagulation and statin   Diabetes  mellitus type 2, with hyperlipidemia - Last hemoglobin A1c 8 on 08/23/21.  Home medication regimen includes metformin 1000 mg twice daily, Jardiance 25 mg daily -Hold metformin, -Continue Jardiance -CBGs are stable   Essential Hypertension -Continue current med regimen as tolerated   Chronic kidney disease stage IIIb - baseline creatinine 1.1-1.4, creatinine stable around 1.4 now   OSA -Continue CPAP at night     Obesity BMI 39.39 kg/m   GERD -Continue pharmacy substitution of Prilosec   DVT prophylaxis: Eliquis Code Status: Full Code  Family communication: Discussed patient detail, no family at bedside Disposition Plan: Home likely 3 to 4 days  Consultants: Cardiology   Procedures:   Antimicrobials:    Objective: Vitals:   12/12/21 2312 12/13/21 0410 12/13/21 0734 12/13/21 1102  BP:  (!) 134/54 130/60 (!) 121/57  Pulse:  62 64 61  Resp: '17 18 20 16  '$ Temp:  98.4 F (36.9 C) 98.8 F (37.1 C) 99.5 F (37.5 C)  TempSrc:  Oral Oral Oral  SpO2:  92% 94% 95%  Weight:  108.9 kg    Height:        Intake/Output Summary (Last 24 hours) at 12/13/2021 1226 Last data filed at 12/13/2021 1106 Gross per 24 hour  Intake 1373 ml  Output 2251 ml  Net -878 ml   Filed Weights   12/11/21 0645 12/12/21 0130 12/13/21 0410  Weight: 108.4 kg 108.9 kg 108.9 kg    Examination:  General exam: Obese pleasant female sitting up in bed, AAOx3, no distress HEENT: Positive JVD CVS: S1-S2, regular rhythm Lungs: Few basilar rales Abdomen: Soft, obese, nontender, bowel sounds present Extremities: Trace edema  Skin: No rashes Psychiatry:  Mood & affect appropriate.  Data Reviewed:   CBC: Recent Labs  Lab 12/10/21 0022 12/11/21 0319  WBC 15.0* 12.5*  HGB 14.4 14.4  HCT 44.7 45.5  MCV 96.8 97.8  PLT 274 299   Basic Metabolic Panel: Recent Labs  Lab 12/10/21 0022 12/11/21 0319 12/12/21 0240 12/13/21 0248  NA 139 141 137 138  K 5.3* 3.4* 4.2 4.0  CL 100 97* 99 99   CO2 26 34* 25 29  GLUCOSE 203* 165* 174* 137*  BUN 31* 16 22 24*  CREATININE 1.35* 1.30* 1.43* 1.25*  CALCIUM 9.4 9.2 8.7* 8.7*  MG  --   --  2.4  --    GFR: Estimated Creatinine Clearance: 48.6 mL/min (A) (by C-G formula based on SCr of 1.25 mg/dL (H)). Liver Function Tests: Recent Labs  Lab 12/10/21 0022 12/11/21 0319  AST 73* 28  ALT 65* 47*  ALKPHOS 88 84  BILITOT 1.1 1.1  PROT 6.9 6.9  ALBUMIN 3.6 3.4*   No results for input(s): "LIPASE", "AMYLASE" in the last 168 hours. No results for input(s): "AMMONIA" in the last 168 hours. Coagulation Profile: No results for input(s): "INR", "PROTIME" in the last 168 hours. Cardiac Enzymes: No results for input(s): "CKTOTAL", "CKMB", "CKMBINDEX", "TROPONINI" in the last 168 hours. BNP (last 3 results) Recent Labs    07/08/21 1221  PROBNP 523   HbA1C: No results for input(s): "HGBA1C" in the last 72 hours. CBG: Recent Labs  Lab 12/12/21 1105 12/12/21 1635 12/12/21 2127 12/13/21 0610 12/13/21 1105  GLUCAP 171* 152* 159* 166* 172*   Lipid Profile: No results for input(s): "CHOL", "HDL", "LDLCALC", "TRIG", "CHOLHDL", "LDLDIRECT" in the last 72 hours. Thyroid Function Tests: No results for input(s): "TSH", "T4TOTAL", "FREET4", "T3FREE", "THYROIDAB" in the last 72 hours. Anemia Panel: No results for input(s): "VITAMINB12", "FOLATE", "FERRITIN", "TIBC", "IRON", "RETICCTPCT" in the last 72 hours. Urine analysis:    Component Value Date/Time   COLORURINE STRAW (A) 06/26/2020 1013   APPEARANCEUR CLEAR 06/26/2020 1013   LABSPEC 1.006 06/26/2020 1013   PHURINE 7.0 06/26/2020 1013   GLUCOSEU 150 (A) 06/26/2020 1013   HGBUR NEGATIVE 06/26/2020 1013   HGBUR negative 06/01/2010 0841   BILIRUBINUR Negative 04/20/2021 1120   KETONESUR NEGATIVE 06/26/2020 1013   PROTEINUR Negative 08/23/2021 0930   PROTEINUR NEGATIVE 06/26/2020 1013   UROBILINOGEN 0.2 08/23/2021 0930   UROBILINOGEN 0.2 06/01/2010 0841   NITRITE neg  08/23/2021 0930   NITRITE NEGATIVE 06/26/2020 1013   LEUKOCYTESUR Negative 08/23/2021 0930   LEUKOCYTESUR NEGATIVE 06/26/2020 1013   Sepsis Labs: '@LABRCNTIP'$ (procalcitonin:4,lacticidven:4)  )No results found for this or any previous visit (from the past 240 hour(s)).   Radiology Studies:Scheduled Meds:  apixaban  5 mg Oral BID   atorvastatin  40 mg Oral Daily   chlorhexidine  15 mL Mouth Rinse BID   diltiazem  360 mg Oral Daily   dofetilide  250 mcg Oral BID   DULoxetine  60 mg Oral Daily   empagliflozin  25 mg Oral Daily   insulin aspart  0-15 Units Subcutaneous TID WC   mouth rinse  15 mL Mouth Rinse q12n4p   metoprolol tartrate  50 mg Oral q morning   And   metoprolol tartrate  100 mg Oral QHS   pantoprazole  40 mg Oral BID   polyvinyl alcohol  1 drop Both Eyes QHS   potassium chloride  40 mEq Oral BID   sodium chloride flush  3 mL Intravenous Q12H   Continuous Infusions:  furosemide  LOS: 3 days    Time spent: 30mn  PDomenic Polite MD [facility]   12/13/2021, 12:26 PM

## 2021-12-13 NOTE — Plan of Care (Signed)
°  Problem: Education: °Goal: Ability to demonstrate management of disease process will improve °Outcome: Progressing °  °Problem: Education: °Goal: Ability to verbalize understanding of medication therapies will improve °Outcome: Progressing °  °Problem: Education: °Goal: Individualized Educational Video(s) °Outcome: Progressing °  °Problem: Activity: °Goal: Capacity to carry out activities will improve °Outcome: Progressing °  °

## 2021-12-13 NOTE — TOC Initial Note (Signed)
Transition of Care [facility]) - Initial/Assessment Note    Patient Details  Name: [name] MRN: [id number] Date of Birth: 05/10/1946  Transition of Care [facility]) CM/SW Contact:    Zenon Mayo, RN Phone Number: 12/13/2021, 9:53 AM  Clinical Narrative:                 From home with spouse, SOB, CHF Ex, IV lasix 80  BID. 8 liters oxygen  , at baseline she wears no oxygen.  Her spouse will transport her home at dc. She has no DME at home.  She would like for meds to be sent to her local pharmacy. She has PCP.  TOC will continue to follow for dc needs.   Expected Discharge Plan: Home/Self Care Barriers to Discharge: Continued Medical Work up   Patient Goals and CMS Choice Patient states their goals for this hospitalization and ongoing recovery are:: return home   Choice offered to / list presented to : NA  Expected Discharge Plan and Services Expected Discharge Plan: Home/Self Care   Discharge Planning Services: CM Consult   Living arrangements for the past 2 months: Single Family Home                   DME Agency: NA       HH Arranged: NA          Prior Living Arrangements/Services Living arrangements for the past 2 months: Single Family Home Lives with:: Spouse Patient language and need for interpreter reviewed:: Yes Do you feel safe going back to the place where you live?: Yes      Need for Family Participation in Patient Care: Yes (Comment) Care giver support system in place?: Yes (comment)   Criminal Activity/Legal Involvement Pertinent to Current Situation/Hospitalization: No - Comment as needed  Activities of Daily Living Home Assistive Devices/Equipment: CPAP, Eyeglasses ADL Screening (condition at time of admission) Patient's cognitive ability adequate to safely complete daily activities?: Yes Is the patient deaf or have difficulty hearing?: No Does the patient have difficulty seeing, even when wearing glasses/contacts?: No Does the patient have  difficulty concentrating, remembering, or making decisions?: No Patient able to express need for assistance with ADLs?: Yes Does the patient have difficulty dressing or bathing?: No Independently performs ADLs?: Yes (appropriate for developmental age) Does the patient have difficulty walking or climbing stairs?: No Weakness of Legs: None Weakness of Arms/Hands: None  Permission Sought/Granted                  Emotional Assessment   Attitude/Demeanor/Rapport: Engaged Affect (typically observed): Appropriate Orientation: : Oriented to Self, Oriented to Place, Oriented to  Time, Oriented to Situation Alcohol / Substance Use: Not Applicable Psych Involvement: No (comment)  Admission diagnosis:  CHF exacerbation ([place]) [I50.9] Acute respiratory failure with hypoxia ([place]) [J96.01] Acute on chronic diastolic CHF (congestive heart failure) ([place]) [I50.33] Patient Active Problem List   Diagnosis Date Noted   SIRS (systemic inflammatory response syndrome) ([place]) 12/10/2021   Hyperkalemia 12/10/2021   Type 2 diabetes mellitus with hyperlipidemia ([place]) 05/22/2020   Acute respiratory failure ([place]) 05/19/2020   Hypokalemia 05/19/2020   Secondary hypercoagulable state ([place]) 02/16/2020   CHF exacerbation ([place]) 05/31/2018   Bronchitis 05/31/2018   Primary osteoarthritis of left knee 04/23/2017   Degenerative arthritis of left knee 04/19/2017   Atypical atrial flutter ([place]) 09/30/2016   Hematoma 09/30/2016   Hypotension 09/30/2016   Persistent atrial fibrillation ([place]) 09/29/2016   Hyperlipidemia    HLD (  hyperlipidemia)    Controlled type 2 diabetes mellitus without complication (HCC)    Acute respiratory failure with hypoxia ([place]) 03/26/2016   DDD (degenerative disc disease), lumbar 04/05/2015   Degenerative disc disease, lumbar 04/05/2015   Severe obesity (BMI >= 40) ([place]) 02/16/2015   Fatty liver disease, nonalcoholic 63/89/3734   Cough, persistent 04/03/2014   Acute on chronic  diastolic congestive heart failure ([place]) 10/07/2013   Pre-diabetes 09/24/2013   Peripheral edema 08/05/2013   Neoplasm of uncertain behavior of skin of back 11/09/2012   Edema 11/04/2012   Bleeding mole 11/04/2012   Pulmonary hypertension ([place]) 06/11/2012   Pneumonia 06/05/2012   Hypoxia 06/05/2012   Anemia 06/05/2012   Hemorrhoids 05/11/2012   Dyspepsia 05/11/2012   Medication management 04/20/2011   Positional vertigo 10/07/2010   Anticoagulant long-term use    HERPES LABIALIS 12/30/2009   INTERTRIGO 12/30/2009   Obesity 06/02/2009   Obstructive sleep apnea 10/12/2008   HYPERLIPIDEMIA 09/15/2008   SNORING 09/15/2008   Essential hypertension 06/19/2008   CAD S/P percutaneous coronary angioplasty 06/19/2008   Paroxysmal atrial fibrillation ([place]) 06/19/2008   BRADYCARDIA-TACHYCARDIA SYNDROME 06/19/2008   CEREBRAL ANEURYSM 06/19/2008   Allergic rhinitis 06/19/2008   HYPERGLYCEMIA 06/19/2008   DEPRESSION, HX OF 06/19/2008   CEREBROVASCULAR ACCIDENT, HX OF 06/19/2008   PACEMAKER, PERMANENT 06/19/2008   PCP:  [name], MD Pharmacy:   [id number] [place] [place]: [phone]: [phone] [facility] [place] Care[facility] [address]: [phone]: [phone]- [place]Phone: [phone]: [phone] [facility] ([facility] ) - [place]Phone: [phone]: [phone]    Social Determinants of Health (SDOH) Interventions    Readmission Risk Interventions    12/13/2021    9:47 AM  Readmission Risk Prevention Plan  Transportation Screening Complete  PCP  or Specialist Appt within 3-5 Days Complete  HRI or [place]  Social Work Consult for [place]anning/Counseling Complete  Palliative Care Screening Not Applicable  Medication Review (Press photographer Complete

## 2021-12-13 NOTE — Progress Notes (Signed)
Progress Note  Patient Name: [name] Date of Encounter: 12/13/2021  Primary Cardiologist: Mertie Moores, MD   Subjective   Overnight kidney function has improved. Patient notes her breathing has improved. Still has significant O2 requirement. No CP, palpitations.  Inpatient Medications    Scheduled Meds:  apixaban  5 mg Oral BID   atorvastatin  40 mg Oral Daily   chlorhexidine  15 mL Mouth Rinse BID   diltiazem  360 mg Oral Daily   dofetilide  250 mcg Oral BID   DULoxetine  60 mg Oral Daily   empagliflozin  25 mg Oral Daily   furosemide  80 mg Intravenous BID   insulin aspart  0-15 Units Subcutaneous TID WC   mouth rinse  15 mL Mouth Rinse q12n4p   metoprolol tartrate  50 mg Oral q morning   And   metoprolol tartrate  100 mg Oral QHS   pantoprazole  40 mg Oral BID   polyvinyl alcohol  1 drop Both Eyes QHS   potassium chloride  40 mEq Oral BID   sodium chloride flush  3 mL Intravenous Q12H   Continuous Infusions:  PRN Meds: acetaminophen **OR** acetaminophen, albuterol, fluticasone, loratadine, nystatin-triamcinolone   Vital Signs    Vitals:   12/12/21 2311 12/12/21 2312 12/13/21 0410 12/13/21 0734  BP: 136/60  (!) 134/54 130/60  Pulse:   62 64  Resp: '20 17 18 20  '$ Temp: 98.2 F (36.8 C)  98.4 F (36.9 C) 98.8 F (37.1 C)  TempSrc: Oral  Oral Oral  SpO2: 93%  92% 94%  Weight:   108.9 kg   Height:        Intake/Output Summary (Last 24 hours) at 12/13/2021 5631 Last data filed at 12/13/2021 4970 Gross per 24 hour  Intake 1373 ml  Output 2351 ml  Net -978 ml   Filed Weights   12/11/21 0645 12/12/21 0130 12/13/21 0410  Weight: 108.4 kg 108.9 kg 108.9 kg    Telemetry    SR, Sr w 1st HB - Personally Reviewed  Physical Exam   Gen: no distress, morbid obesity   Neck: No JVD Cardiac: No Rubs or Gallops, no murmur, RRR +2 radial pulses Respiratory: Bilateral inspiratory wheezes, bilateral crackles, normal effort, normal  respiratory rate GI:  Soft, nontender, non-distended  MS: Non pitting edema;  moves all extremities Integument: Skin feels warm Neuro:  At time of evaluation, alert and oriented to person/place/time/situation  Psych: Normal affect, patient feels [facility]   Labs    Chemistry Recent Labs  Lab 12/10/21 0022 12/11/21 0319 12/12/21 0240 12/13/21 0248  NA 139 141 137 138  K 5.3* 3.4* 4.2 4.0  CL 100 97* 99 99  CO2 26 34* 25 29  GLUCOSE 203* 165* 174* 137*  BUN 31* 16 22 24*  CREATININE 1.35* 1.30* 1.43* 1.25*  CALCIUM 9.4 9.2 8.7* 8.7*  PROT 6.9 6.9  --   --   ALBUMIN 3.6 3.4*  --   --   AST 73* 28  --   --   ALT 65* 47*  --   --   ALKPHOS 88 84  --   --   BILITOT 1.1 1.1  --   --   GFRNONAA 41* 43* 38* 45*  ANIONGAP '13 10 13 10     '$ Hematology Recent Labs  Lab 12/10/21 0022 12/11/21 0319  WBC 15.0* 12.5*  RBC 4.62 4.65  HGB 14.4 14.4  HCT 44.7 45.5  MCV 96.8 97.8  MCH 31.2 31.0  MCHC 32.2 31.6  RDW 14.5 14.6  PLT 274 262    Cardiac EnzymesNo results for input(s): "TROPONINI" in the last 168 hours. No results for input(s): "TROPIPOC" in the last 168 hours.   BNP Recent Labs  Lab 12/10/21 0022  BNP 122.2*     DDimer No results for input(s): "DDIMER" in the last 168 hours.   Radiology    No results found.   Patient Profile     76 y.o. female CAD, HLD with DM, HFpEF, OSA on CPA, Morbid Obesity, HTN with DM, CKD Stage IIIa, SSS s/p PPM and Persistent AF seen for dyspnea  Assessment & Plan    Mixed Hypoxic respiratory failure OSA on CPAP  Heart Failure Preserved Ejection Fraction  - (No significant LVH, diastology not performed in AF) HTN with DM CKD Stage IIIa (creatinine 1.1-1.4, still within her range) - NYHA class III, Stage C, hypervolemic, etiology mixed picture - Diuretic regimen: Lasix 100 IV (increased today, at home takes metolazone every 4 days 2.5 mg )  - agree with CT scan for today - given kidney function and Tikosyn, aggressive diuresis has not been pursued;  low threshold for RHC if persistent SOB of unclear etiology - if Cr is stable, will start aldactone this admission  History of pulmonary hypertension with prior queries of PVOD (not suggestive on 2021 CTPV -low threshold for RHC.  Persistent AF - on 150 mg metoprolol tartrate, Tikoysn 250, and  diltiazem 360 - on eliquis  CAD - s/p RCA PCI in the past - asymptomatic, continue statin  Sick sinus syndrome S/p PPM, no issues  For questions or updates, please contact [facility] Please consult [email or web address] for contact info under Cardiology/STEMI.      Rudean Haskell, MD Cardiologist Hypertrophic [address] [place], [place] [address] [phone]  9:21 AM

## 2021-12-14 ENCOUNTER — Other Ambulatory Visit (HOSPITAL_COMMUNITY): Payer: Self-pay

## 2021-12-14 DIAGNOSIS — I2510 Atherosclerotic heart disease of native coronary artery without angina pectoris: Secondary | ICD-10-CM | POA: Diagnosis not present

## 2021-12-14 DIAGNOSIS — Z950 Presence of cardiac pacemaker: Secondary | ICD-10-CM | POA: Diagnosis not present

## 2021-12-14 DIAGNOSIS — J9601 Acute respiratory failure with hypoxia: Secondary | ICD-10-CM

## 2021-12-14 DIAGNOSIS — I5033 Acute on chronic diastolic (congestive) heart failure: Secondary | ICD-10-CM | POA: Diagnosis not present

## 2021-12-14 DIAGNOSIS — J8410 Pulmonary fibrosis, unspecified: Secondary | ICD-10-CM | POA: Diagnosis not present

## 2021-12-14 DIAGNOSIS — G4733 Obstructive sleep apnea (adult) (pediatric): Secondary | ICD-10-CM | POA: Diagnosis not present

## 2021-12-14 DIAGNOSIS — J849 Interstitial pulmonary disease, unspecified: Secondary | ICD-10-CM | POA: Diagnosis not present

## 2021-12-14 LAB — GLUCOSE, CAPILLARY
Glucose-Capillary: 146 mg/dL — ABNORMAL HIGH (ref 70–99)
Glucose-Capillary: 137 mg/dL — ABNORMAL HIGH (ref 70–99)
Glucose-Capillary: 152 mg/dL — ABNORMAL HIGH (ref 70–99)
Glucose-Capillary: 167 mg/dL — ABNORMAL HIGH (ref 70–99)

## 2021-12-14 LAB — BASIC METABOLIC PANEL
Anion gap: 6 (ref 5–15)
BUN: 25 mg/dL — ABNORMAL HIGH (ref 8–23)
CO2: 32 mmol/L (ref 22–32)
Calcium: 8.9 mg/dL (ref 8.9–10.3)
Chloride: 103 mmol/L (ref 98–111)
Creatinine, Ser: 1.35 mg/dL — ABNORMAL HIGH (ref 0.44–1.00)
GFR, Estimated: 41 mL/min — ABNORMAL LOW (ref >60)
Glucose, Bld: 144 mg/dL — ABNORMAL HIGH (ref 70–99)
Potassium: 5.1 mmol/L (ref 3.5–5.1)
Sodium: 141 mmol/L (ref 135–145)

## 2021-12-14 LAB — CBC
HCT: 45.4 % (ref 36.0–46.0)
Hemoglobin: 14.4 g/dL (ref 12.0–15.0)
MCH: 31.2 pg (ref 26.0–34.0)
MCHC: 31.7 g/dL (ref 30.0–36.0)
MCV: 98.5 fL (ref 80.0–100.0)
Platelets: 284 10*3/uL (ref 150–400)
RBC: 4.61 MIL/uL (ref 3.87–5.11)
RDW: 14 % (ref 11.5–15.5)
WBC: 10.5 10*3/uL (ref 4.0–10.5)
nRBC: 0 % (ref 0.0–0.2)

## 2021-12-14 LAB — SEDIMENTATION RATE: Sed Rate: 60 mm/hr — ABNORMAL HIGH (ref 0–22)

## 2021-12-14 MED ORDER — POTASSIUM CHLORIDE CRYS ER 20 MEQ PO TBCR
20.0000 meq | EXTENDED_RELEASE_TABLET | Freq: Every day | ORAL | Status: DC
  Administered 2021-12-15 – 2021-12-16 (×2): 20 meq via ORAL
  Filled 2021-12-14 (×2): qty 1

## 2021-12-14 MED ORDER — FUROSEMIDE 10 MG/ML IJ SOLN
100.0000 mg | Freq: Once | INTRAVENOUS | Status: DC
  Filled 2021-12-14: qty 10

## 2021-12-14 NOTE — Consult Note (Signed)
[name], MRN:  [id number], DOB:  11/08/1945, LOS: 4 ADMISSION DATE:  12/10/2021, CONSULTATION DATE:  6/14 REFERRING MD:  Dr Roger Shelter TRH, CHIEF COMPLAINT:  hypoxia   History of Present Illness:  76 year old female with PMH as below, which is significant for CHF, OSA on CPAP, MI, and atrial fibrillation. Most of her history centers around cardiac issues. She undergone several ablations and cardioversions for AF. She has a pacemaker.  She has been treated with amiodarone briefly and Tikosyn. Also has had PCI for MI on two occasions. She had viral pneumonia secondary to RSV in 2020 and has had intermittent breathing troubles since then, but admits they may have been fluid related considering CHF history. She is compliant with CPAP. She has been seen by Dr. Chase Caller in the pulmonary clinic for ground glass type changes on CT after her RSV admission. He has been following serial CT, but has not given a diagnosis such as fibrosis.   She presented to [facility] 6/10 with complaints of shortness of breath and dizziness. Admitted to the hospitalist service for acute on chronic HFpEF and treated with diuretics. She did require BiPAP at the time of admission. Cardiology was consulted and has guided diuresis. As of 6/14 she had diuresed well as she was 8.5 liters negative, however, she still required 4L [place] to keep sats in the low to mid 90s. PCCM was consulted for further evaluation.   She denies cough, fever, chills. Does not report any environmental exposures like pets or mold. Did have lower extremity edema, which has improved. No sick contacts. Only lives with her husband. Very brief smoking history for 5 years and quit in 1984.  Pertinent  Medical History   has a past medical history of Anticoagulant long-term use, Anxiety, Arthritis, CAD (coronary artery disease) (3785,8850), CHF (congestive heart failure) ([place]), Chronic atrial fibrillation ([place]) (06/2007), Chronic kidney disease, CVA (cerebral  vascular accident) ([place]) (2774,1287), Depression, Diplopia (06/19/2008), Dysrhythmia, Edema of lower extremity, Hyperlipidemia, Hypertension, Inferior myocardial infarction [facility]), Myocardial infarction ([place]) (8676,7209), Obesity, OSA on CPAP, Pacemaker, Pneumonia (2014), Pulmonary hypertension ([place]), Shortness of breath, Skin cancer, Sleep apnea, Spondylolisthesis, TIA (transient ischemic attack) (2008), and Unspecified hemorrhoids without mention of complication (4/70/9628).   Significant Hospital Events: Including procedures, antibiotic start and stop dates in addition to other pertinent events     Interim History / Subjective:    Objective   Blood pressure (!) 121/54, pulse 60, temperature 98.7 F (37.1 C), temperature source Oral, resp. rate 19, height _0  (1.676 m), weight 107.4 kg, SpO2 94 %.        Intake/Output Summary (Last 24 hours) at 12/14/2021 1610 Last data filed at 12/14/2021 1532 Gross per 24 hour  Intake 786.41 ml  Output 3400 ml  Net -2613.59 ml   Filed Weights   12/12/21 0130 12/13/21 0410 12/14/21 0406  Weight: 108.9 kg 108.9 kg 107.4 kg    Examination: General: overweight elderly appearing female in [place]: [place]/AT, PERRL, no JVD Lungs: coarse crackles R base, otherwise cleared with cough.  Cardiovascular: RRR, no MRG Abdomen: Soft, non-tender, non-distended Extremities: No acute deformity, trace lower extremity edema.  Neuro: Alert, oriented, non-focal. Very pleasant.   HRCT 6/13 scattered ground glass. Mild underlying apical predominant interstitial opacities.   Echo 6/10 > LVEF 60-65%, Normal valves. RV, LV appear normal.    Resolved Hospital Problem list     Assessment & Plan:   Acute hypoxemic respiratory failure: multifactorial. Majority of her  symptoms likely derive from CHF exacerbation as she has improved quite a bit clinically with 8+ liters of diuresis. However, she does have abnormal GGO on her HRCT from 6/13. This is not significantly  different from scans she has had over the past couple of years, in fact, sometimes it has been worse than this on imaging. Does not clearly resemble pulmonary fibrosis. She had serum autoimmune markers sent a couple years ago with some abnormal results.  - Continue supplemental O2 to keep sats > 92% - No role for steroids or antibiotics at this time - Will repeat ESR, ANA, and CCP  - Will continue to monitor as she is further diuresed - Suspect she may ultimately need to be discharged on oxygen and follow up in the pulmonary clinic.   OSA on CPAP - continue home CPAP settings  Acute on chronic HFpEF Atrial fibrillation - Management per cardiology, TRH  Dm - per primary    Labs   CBC: Recent Labs  Lab 12/10/21 0022 12/11/21 0319 12/14/21 0330  WBC 15.0* 12.5* 10.5  HGB 14.4 14.4 14.4  HCT 44.7 45.5 45.4  MCV 96.8 97.8 98.5  PLT 274 262 440    Basic Metabolic Panel: Recent Labs  Lab 12/10/21 0022 12/11/21 0319 12/12/21 0240 12/13/21 0248 12/14/21 0330  NA 139 141 137 138 141  K 5.3* 3.4* 4.2 4.0 5.1  CL 100 97* 99 99 103  CO2 26 34* 25 29 32  GLUCOSE 203* 165* 174* 137* 144*  BUN 31* 16 22 24* 25*  CREATININE 1.35* 1.30* 1.43* 1.25* 1.35*  CALCIUM 9.4 9.2 8.7* 8.7* 8.9  MG  --   --  2.4  --   --    GFR: Estimated Creatinine Clearance: 44.6 mL/min (A) (by C-G formula based on SCr of 1.35 mg/dL (H)). Recent Labs  Lab 12/10/21 0022 12/11/21 0319 12/14/21 0330  WBC 15.0* 12.5* 10.5    Liver Function Tests: Recent Labs  Lab 12/10/21 0022 12/11/21 0319  AST 73* 28  ALT 65* 47*  ALKPHOS 88 84  BILITOT 1.1 1.1  PROT 6.9 6.9  ALBUMIN 3.6 3.4*   No results for input(s): "LIPASE", "AMYLASE" in the last 168 hours. No results for input(s): "AMMONIA" in the last 168 hours.  ABG    Component Value Date/Time   PHART 7.456 (H) 05/19/2020 1545   PCO2ART 51.6 (H) 05/19/2020 1545   PO2ART 60 (L) 05/19/2020 1545   HCO3 36.1 (H) 05/19/2020 1545   TCO2 38 (H)  05/19/2020 1545   O2SAT 90.0 05/19/2020 1545     Coagulation Profile: No results for input(s): "INR", "PROTIME" in the last 168 hours.  Cardiac Enzymes: No results for input(s): "CKTOTAL", "CKMB", "CKMBINDEX", "TROPONINI" in the last 168 hours.  HbA1C: Hgb A1c MFr Bld  Date/Time Value Ref Range Status  08/23/2021 09:35 AM 8.0 (H) 4.6 - 6.5 % Final    Comment:    Glycemic Control Guidelines for People with Diabetes:Non Diabetic:  <6%Goal of Therapy: <7%Additional Action Suggested:  >8%   04/20/2021 11:30 AM 8.2 (H) 4.6 - 6.5 % Final    Comment:    Glycemic Control Guidelines for People with Diabetes:Non Diabetic:  <6%Goal of Therapy: <7%Additional Action Suggested:  >8%     CBG: Recent Labs  Lab 12/13/21 1631 12/13/21 2131 12/14/21 0601 12/14/21 1124 12/14/21 1606  GLUCAP 146* 168* 146* 137* 152*    Review of Systems:    Bolds are positive  Constitutional: weight loss, gain, night sweats,  Fevers, chills, fatigue .  HEENT: headaches, Sore throat, sneezing, nasal congestion, post nasal drip, Difficulty swallowing, Tooth/dental problems, visual complaints visual changes, ear ache CV:  chest pain, radiates:,Orthopnea, PND, swelling in lower extremities, dizziness, palpitations, syncope.  GI  heartburn, indigestion, abdominal pain, nausea, vomiting, diarrhea, change in bowel habits, loss of appetite, bloody stools.  Resp: cough, productive: , hemoptysis, dyspnea, chest pain, pleuritic.  Skin: rash or itching or icterus GU: dysuria, change in color of urine, urgency or frequency. flank pain, hematuria  MS: joint pain or swelling. decreased range of motion  Psych: change in mood or affect. depression or anxiety.  Neuro: difficulty with speech, weakness, numbness, ataxia    Past Medical History:  She,  has a past medical history of Anticoagulant long-term use, Anxiety, Arthritis, CAD (coronary artery disease) (7035,0093), CHF (congestive heart failure) ([place]), Chronic atrial  fibrillation ([place]) (06/2007), Chronic kidney disease, CVA (cerebral vascular accident) ([place]) (8182,9937), Depression, Diplopia (06/19/2008), Dysrhythmia, Edema of lower extremity, Hyperlipidemia, Hypertension, Inferior myocardial infarction [facility]), Myocardial infarction ([place]) (1696,7893), Obesity, OSA on CPAP, Pacemaker, Pneumonia (2014), Pulmonary hypertension ([place]), Shortness of breath, Skin cancer, Sleep apnea, Spondylolisthesis, TIA (transient ischemic attack) (2008), and Unspecified hemorrhoids without mention of complication (02/09/1750).   Surgical History:   Past Surgical History:  Procedure Laterality Date   ABDOMINAL HYSTERECTOMY     APPENDECTOMY  1984   ATRIAL FIBRILLATION ABLATION N/A 09/29/2016   Procedure: Atrial Fibrillation Ablation;  Surgeon: Will Meredith Leeds, MD;  Location: [place] CV LAB;  Service: Cardiovascular;  Laterality: N/A;   ATRIAL FIBRILLATION ABLATION N/A 02/07/2018   Procedure: ATRIAL FIBRILLATION ABLATION;  Surgeon: Constance Haw, MD;  Location: [place] CV LAB;  Service: Cardiovascular;  Laterality: N/A;   ATRIAL FIBRILLATION ABLATION N/A 03/13/2019   Procedure: ATRIAL FIBRILLATION ABLATION;  Surgeon: Constance Haw, MD;  Location: [place] CV LAB;  Service: Cardiovascular;  Laterality: N/A;   ATRIAL FIBRILLATION ABLATION N/A 07/07/2020   Procedure: ATRIAL FIBRILLATION ABLATION;  Surgeon: Constance Haw, MD;  Location: [place] CV LAB;  Service: Cardiovascular;  Laterality: N/A;   BACK SURGERY     CARDIOVERSION N/A 09/12/2017   Procedure: CARDIOVERSION;  Surgeon: Jerline Pain, MD;  Location: [facility] ENDOSCOPY;  Service: Cardiovascular;  Laterality: N/A;   CARDIOVERSION N/A 12/13/2017   Procedure: CARDIOVERSION;  Surgeon: Sanda Klein, MD;  Location: [place] ENDOSCOPY;  Service: Cardiovascular;  Laterality: N/A;   CARDIOVERSION N/A 02/18/2018   Procedure: CARDIOVERSION;  Surgeon: Dorothy Spark, MD;  Location: [facility] ENDOSCOPY;  Service:  Cardiovascular;  Laterality: N/A;   CARDIOVERSION N/A 05/20/2018   Procedure: CARDIOVERSION;  Surgeon: [name], MD;  Location: [facility] ENDOSCOPY;  Service: Cardiovascular;  Laterality: N/A;   CARDIOVERSION N/A 04/16/2019   Procedure: CARDIOVERSION;  Surgeon: Donato Heinz, MD;  Location: [place];  Service: Endoscopy;  Laterality: N/A;   CARDIOVERSION N/A 01/22/2020   Procedure: CARDIOVERSION;  Surgeon: Geralynn Rile, MD;  Location: [place];  Service: Cardiovascular;  Laterality: N/A;   CARDIOVERSION N/A 02/18/2020   Procedure: CARDIOVERSION;  Surgeon: Elouise Munroe, MD;  Location: [facility] ENDOSCOPY;  Service: Cardiovascular;  Laterality: N/A;   CATARACT EXTRACTION W/ INTRAOCULAR LENS  IMPLANT, BILATERAL Bilateral 01/15/2017- 03/2017   CHOLECYSTECTOMY     CORONARY ANGIOPLASTY  X 2   CORONARY ANGIOPLASTY WITH STENT PLACEMENT  1998; ~ 2007; ?date   "1 stent; replaced stent; not sure when I got the last stent" (04/23/2017)   DOPPLER ECHOCARDIOGRAPHY  2009   ELECTROPHYSIOLOGIC STUDY  N/A 03/31/2016   Procedure: Cardioversion;  Surgeon: Evans Lance, MD;  Location: [place] CV LAB;  Service: Cardiovascular;  Laterality: N/A;   ELECTROPHYSIOLOGIC STUDY N/A 08/04/2016   Procedure: Cardioversion;  Surgeon: Evans Lance, MD;  Location: [place] CV LAB;  Service: Cardiovascular;  Laterality: N/A;   INSERT / REPLACE / REMOVE PACEMAKER  06/2007   IR RADIOLOGY PERIPHERAL GUIDED IV START  01/31/2018   IR US GUIDE VASC ACCESS LEFT  01/31/2018   JOINT REPLACEMENT     LAPAROSCOPIC CHOLECYSTECTOMY  1994   LEFT AND RIGHT HEART CATHETERIZATION WITH CORONARY ANGIOGRAM N/A 10/06/2013   Procedure: LEFT AND RIGHT HEART CATHETERIZATION WITH CORONARY ANGIOGRAM;  Surgeon: Troy Sine, MD;  Location: [facility] CATH LAB;  Service: Cardiovascular;  Laterality: N/A;   LEFT OOPHORECTOMY Left ~ [place]  2000s - 04/2015 X 3   L3-4; L4-5; L2-3; Dr Trenton Gammon   RIGHT/LEFT HEART CATH  AND CORONARY ANGIOGRAPHY N/A 05/09/2017   Procedure: RIGHT/LEFT HEART CATH AND CORONARY ANGIOGRAPHY;  Surgeon: Sherren Mocha, MD;  Location: [place] CV LAB;  Service: Cardiovascular;  Laterality: N/A;   SKIN CANCER EXCISION Right    [place]   TEE WITHOUT CARDIOVERSION N/A 09/29/2016   Procedure: TRANSESOPHAGEAL ECHOCARDIOGRAM (TEE);  Surgeon: Jerline Pain, MD;  Location: [facility] ENDOSCOPY;  Service: Cardiovascular;  Laterality: N/A;   TOTAL ABDOMINAL HYSTERECTOMY  1984   "uterus & right ovary"   TOTAL KNEE ARTHROPLASTY Left 04/23/2017   TOTAL KNEE ARTHROPLASTY Left 04/23/2017   Procedure: TOTAL KNEE ARTHROPLASTY;  Surgeon: Frederik Pear, MD;  Location: [place];  Service: Orthopedics;  Laterality: Left;   ULTRASOUND GUIDANCE FOR VASCULAR ACCESS  05/09/2017   Procedure: Ultrasound Guidance For Vascular Access;  Surgeon: Sherren Mocha, MD;  Location: [place] CV LAB;  Service: Cardiovascular;;     Social History:   reports that she quit smoking about 39 years ago. Her smoking use included cigarettes. She started smoking about 43 years ago. She has a 5.00 pack-year smoking history. She has never used smokeless tobacco. She reports current alcohol use. She reports that she does not use drugs.   Family History:  Her family history includes Arrhythmia in her mother; Colon cancer in her maternal aunt; Dementia in her mother; Diabetes in her mother and paternal grandfather; Heart attack in her brother; Heart disease in her paternal aunt; Hypertension in her mother; Prostate cancer in her maternal grandfather; Suicidality in her father.   Allergies Allergies  Allergen Reactions   Hydrocodone-Guaifenesin Other (See Comments)    Confusion/ memory loss   Adhesive [Tape] Rash and Other (See Comments)    Allergic to defibrillation pads.   Pedi-Pre Tape Spray [Wound Dressing Adhesive] Other (See Comments) and Rash    Allergic to defibrillation pads.     Home Medications  Prior to Admission  medications   Medication Sig Start Date End Date Taking? Authorizing Provider  apixaban (ELIQUIS) 5 MG TABS tablet Take 1 tablet (5 mg total) by mouth 2 (two) times daily. 07/07/21  Yes Camnitz, Ocie Doyne, MD  Ascorbic Acid (VITAMIN C) 1000 MG tablet Take 1,000 mg by mouth at bedtime.    Yes [provider]  atorvastatin (LIPITOR) 40 MG tablet TAKE 1 TABLET BY MOUTH DAILY Patient taking differently: Take 40 mg by mouth daily. 11/11/21  Yes Camnitz, Ocie Doyne, MD  Calcium Carbonate-Vitamin D (CALCIUM 600+D PO) Take 1 tablet by mouth daily.    Yes [provider]  cholecalciferol (  VITAMIN D) 25 MCG (1000 UNIT) tablet Take 1,000 Units by mouth at bedtime.   Yes [provider]  diltiazem (CARDIZEM LA) 360 MG 24 hr tablet Take 1 tablet (360 mg total) by mouth daily. 07/07/21  Yes Camnitz, Will Hassell Done, MD  dofetilide (TIKOSYN) 250 MCG capsule TAKE ONE CAPSULE BY MOUTH TWICE A DAY Patient taking differently: Take 250 mcg by mouth 2 (two) times daily. 09/21/21  Yes Camnitz, Will Hassell Done, MD  DULoxetine (CYMBALTA) 60 MG capsule TAKE 1 CAPSULE BY MOUTH EVERY DAY Patient taking differently: Take 60 mg by mouth daily. 09/09/21  Yes [name], [name], MD  fluticasone (FLONASE) 50 MCG/ACT nasal spray Place 1 spray into both nostrils daily as needed for allergies.   Yes [provider]  JARDIANCE 25 MG TABS tablet TAKE 1 TABLET BY MOUTH DAILY Patient taking differently: Take 25 mg by mouth daily. 07/20/21  Yes [name], [name], MD  loratadine (CLARITIN) 10 MG tablet TAKE 1 TABLET BY MOUTH EVERY DAY Patient taking differently: Take 10 mg by mouth daily as needed for allergies. 04/28/21  Yes [name], [name], MD  Lysine 1000 MG TABS Take 1,000 mg by mouth at bedtime.    Yes [provider]  metFORMIN (GLUCOPHAGE) 1000 MG tablet Take 1 tablet (1,000 mg total) by mouth 2 (two) times daily. 10/13/21  Yes [name], [name], MD  metolazone (ZAROXOLYN) 2.5 MG tablet TAKE 1 TABLET BY  MOUTH EVERY 4  DAYS AS NEEDED FOR SWELLING  PLEASE KEEP UPCOMING APPOINTMENT IN JAN, 2023 FOR FURTHER REFILLS Patient taking differently: Take 2.5 mg by mouth See admin instructions. Every 4 days as needed for swelling 08/30/21  Yes Camnitz, Ocie Doyne, MD  metoprolol tartrate (LOPRESSOR) 50 MG tablet Take 3 tablets (150 mg total) by mouth daily. TAKE 50 MG IN AM TAKE 100 MG IN PM Patient taking differently: Take 50-100 mg by mouth See admin instructions. 50 mg in the morning  100 mg in the evening 10/06/21  Yes Baldwin Jamaica, PA-C  Multiple Vitamin (MULTIVITAMIN WITH MINERALS) TABS tablet Take 1 tablet by mouth daily. Centrum   Yes [provider]  NON FORMULARY CPAP at bedtime   Yes [provider]  nystatin-triamcinolone (MYCOLOG II) cream Apply 1 application topically 2 (two) times daily as needed. Patient taking differently: Apply 1 application  topically 2 (two) times daily as needed (rash). 08/23/21  Yes [name], [name], MD  omeprazole (PRILOSEC) 20 MG capsule Take 20 mg by mouth 2 (two) times daily.   Yes [provider]  Polyethyl Glycol-Propyl Glycol (SYSTANE) 0.4-0.3 % SOLN Place 1 drop into both eyes at bedtime.   Yes [provider]  potassium chloride SA (KLOR-CON M) 20 MEQ tablet TAKE 2 TABLETS BY MOUTH TWICE  DAILY Patient taking differently: Take 40 mEq by mouth 2 (two) times daily. 07/19/21  Yes Camnitz, Will Hassell Done, MD  torsemide (DEMADEX) 20 MG tablet TAKE 2 TABLETS BY MOUTH TWICE  DAILY Patient taking differently: Take 40 mg by mouth 2 (two) times daily. 08/22/21  Yes Camnitz, Will Hassell Done, MD  valACYclovir (VALTREX) 1000 MG tablet TAKE 2 TABLETS BY MOUTH EVERY 12 HOURS AS NEEDED Patient taking differently: Take 2,000 mg by mouth 2 (two) times daily as needed (fever blisters). 09/07/21  Yes [name], [name], MD  vitamin B-12 (CYANOCOBALAMIN) 1000 MCG tablet Take 1,000 mcg by mouth at bedtime.   Yes [provider]  nitroGLYCERIN  (NITROSTAT) 0.4 MG SL tablet Place 1 tablet (0.4  mg total) under the tongue every 5 (five) minutes x 3 doses as needed for chest pain. PLEASE CALL [phone] AND SCHEDULE AN OVERDUE APPT WITH DR Acie Fredrickson FOR FURTHER REFILLS TO BE GRANTED (2ND ATTEMPT) Patient taking differently: Place 0.4 mg under the tongue every 5 (five) minutes x 3 doses as needed for chest pain. 07/05/21   Nahser, Wonda Cheng, MD     Critical care time:      Georgann Housekeeper, AGACNP-BC [place] for personal pager PCCM on call pager [phone] until 7pm. Please call Elink 7p-7a. [phone]  12/14/2021 4:34 PM

## 2021-12-14 NOTE — Progress Notes (Signed)
Mobility Specialist Progress Note:   12/14/21 0932  Mobility  Activity Ambulated with assistance in hallway  Level of Assistance Modified independent, requires aide device or extra time  Assistive Device None  Distance Ambulated (ft) 500 ft  Activity Response Tolerated well  $Mobility charge 1 Mobility   Pt received in bed willing to participate in mobility. No complaints of pain. Left in chair with call bell in reach and all needs met.   [facility] [name] Mobility Specialist

## 2021-12-14 NOTE — Progress Notes (Addendum)
Progress Note   Patient: [name] PNT:[id number] DOB: December 23, 1945 DOA: 12/10/2021     4 DOS: the patient was seen and examined on 12/14/2021   Brief hospital course: 75/F history of chronic diastolic CHF, paroxysmal A-fib on Eliquis, tachybradycardia syndrome with PPM, sleep apnea, CAD, hypertension, dyslipidemia and obesity presented to the ED with dyspnea on exertion, orthopnea and leg swelling, she is compliant with Lasix and metolazone. -In the ED she was noted to be hypoxic with sats in the 80s required nonrebreather mask initially, subsequently weaned down to 6 to 7 L O2, BNP was 122, troponin was 4, chest x-ray noted mild pulmonary vascular congestion -Admitted, started on diuretics, cards following    Assessment and Plan: Acute hypoxic respiratory failure Acute on chronic diastolic CHF Pulmonary fibrosis on high-resolution CT -?  Severe PAH noted on right heart cath 2018 -2D echo with EF of 60-65%, indeterminate diastolic parameters, RV size and function is normal -Clinically improving,, she is 5.4 L negative clinically improving, still requiring 7 to 8 L of O2, CT chest from 4/22 concerning for possible pulmonary fibrosis and this was confirmed on high-resolution CT.  Of note patient states that she had RSV in 2020 and briefly followed up with Dr. Chase Caller at the pulmonology office.  We will consult pulmonology team-may benefit from steroid rxn -Discussed with cardiology and suspect that there may be a component of noncardiogenic issues contributing to patient's pulmonary symptoms and require need for O2 therefore suspect we have reached an endpoint with diuresis.  Plan is to give 1 more dose of IV Lasix today then stop.  -Continue empagliflozin for now but watch renal function -Cardiology following -Wean O2 as tolerated, anticipate need for oxygen at discharge, incentive spirometry -We will need to check either ABG or ambulatory O2 sats prior to discharge   History of pulmonary  hypertension -On right heart cath 2018, echo this admission without suggestion   Hyperkalemia, corrected now hypokalemic -Supplemented   Paroxysmal atrial fibrillation on anticoagulation tachybradycardia syndrome s/p permanent pacemaker Patient appears to be rate controlled at this time.  Status post multiple ablations.  Last device interrogation noted normal function 10/06/2021.   -Continue Tikosyn and Eliquis -Keep K> 4 and mag> 2   CAD Patient with prior PCI to RCA.  Noted to have coronary CTA back in 06/2020 -Continue anticoagulation and statin   Diabetes mellitus type 2, with hyperlipidemia - Last hemoglobin A1c 8 on 08/23/21.  Home medication regimen includes metformin 1000 mg twice daily, Jardiance 25 mg daily -Hold metformin, -Continue Jardiance -CBGs are stable   Essential Hypertension -Continue current med regimen as tolerated   Chronic kidney disease stage IIIb - baseline creatinine 1.1-1.4, creatinine stable around 1.4 now   OSA -Continue CPAP at night     Obesity BMI 39.39 kg/m   GERD -Continue pharmacy substitution of Prilosec        Subjective: No shortness of breath reported while on oxygen and patient states only has dyspnea on exertion if ambulates at a rapid pace  Physical Exam: Vitals:   12/14/21 0004 12/14/21 0406 12/14/21 0743 12/14/21 1127  BP: (!) 112/58 104/69 (!) 128/51 129/61  Pulse: (!) 58 (!) 55 61 66  Resp: '18 19 16 18  '$ Temp: 98 F (36.7 C) 98.5 F (36.9 C) 98.3 F (36.8 C) 98.6 F (37 C)  TempSrc: Oral Oral Oral Oral  SpO2: 97% 92% 95% 92%  Weight:  107.4 kg    Height:  General exam: Obese pleasant female sitting up in bed, AAOx3, no distress  HEENT: Positive bilateral JVD CVS: S1-S2, regular rhythm, adequate capillary perfusion Lungs: Lung sounds on posterior exam with diffuse expiratory crackles Abdomen: Soft, obese, nontender, bowel sounds present Extremities: Trace edema  Skin: No rashes Psychiatry:  Mood & affect  appropriate.    Data Reviewed:  Cr up to 1.35, K+ 5.1  Family Communication: Patient only  Disposition: Status is: Inpatient Remains inpatient appropriate because: Continued need for high level nasal cannula O2 likely secondary to new onset pulmonary hypertension.  Will likely need inpatient pulmonary consultation.  With aggressive diuresis has developed some worsening of serum creatinine.  Planned Discharge Destination: Home    Time spent: 30 minutes  Author: Erin Hearing, NP 12/14/2021 2:55 PM  For on call review [email or web address].   Patient was seen and examined dependently, agree with above assessment and plan.  SIGNED: Deatra James, MD, FHM. [facility],  Pager (please use [email or web address] to page/text)  Please use Epic Secure Chat for non-urgent communication (7AM-7PM) If 7PM-7AM, please contact night-coverage [email or web address],  12/14/2021, 4:07 PM

## 2021-12-14 NOTE — Consult Note (Signed)
   [facility] CM Inpatient Consult   12/14/2021  [name] 07/14/1945 [id number]  [place] [facility] [[facility]] Patient: [name]  Primary Care Provider:  [name], MD, [place] [facility], is an embedded provider with a Chronic Care Management team and program, and is listed for the transition of care follow up and appointments.  Patient was screened for Embedded practice services noted  chronic care management with Embedded Pharmacist noted in encounters for diabetes medication management noted. Admitted with HF exacerbation.  Plan: Continue to follow and notification can be sent to the Embedded Care Management for any additional needs of TOC needs for post hospital needs.  Met with the patient at bedside regarding post hospital follow up. Patient given a reminder appointment card and endorses PCP follow up and wellness visits.  She also endorses follow up with the Embedded pharmacist for ongoing CCM needs. Spoke with patient at length regarding hospital ED experience.  Encourage patient to fill out surveys to assist with ongoing customer service improvements.  Please contact for further questions,  [name], RN BSN [facility] Liaison  [phone] business mobile phone Toll free office [phone]  Fax number: [phone] [email or web address]

## 2021-12-14 NOTE — Progress Notes (Signed)
Progress Note  Patient Name: [name] Date of Encounter: 12/14/2021  Primary Cardiologist: Mertie Moores, MD   Subjective   Stable kidney function (within her range). CT suggestive of UIP. Still has significant O2 requirement improved to 4 L. No CP, palpitations.  Inpatient Medications    Scheduled Meds:  apixaban  5 mg Oral BID   atorvastatin  40 mg Oral Daily   chlorhexidine  15 mL Mouth Rinse BID   diltiazem  360 mg Oral Daily   dofetilide  250 mcg Oral BID   DULoxetine  60 mg Oral Daily   empagliflozin  25 mg Oral Daily   insulin aspart  0-15 Units Subcutaneous TID WC   mouth rinse  15 mL Mouth Rinse q12n4p   metoprolol tartrate  50 mg Oral q morning   And   metoprolol tartrate  100 mg Oral QHS   pantoprazole  40 mg Oral BID   polyvinyl alcohol  1 drop Both Eyes QHS   potassium chloride  40 mEq Oral BID   sodium chloride flush  3 mL Intravenous Q12H   Continuous Infusions:  furosemide 100 mg (12/14/21 0946)   PRN Meds: acetaminophen **OR** acetaminophen, albuterol, fluticasone, loratadine, nystatin-triamcinolone   Vital Signs    Vitals:   12/13/21 1958 12/14/21 0004 12/14/21 0406 12/14/21 0743  BP: (!) 118/56 (!) 112/58 104/69 (!) 128/51  Pulse: 67 (!) 58 (!) 55 61  Resp: '18 18 19 16  '$ Temp: 97.8 F (36.6 C) 98 F (36.7 C) 98.5 F (36.9 C) 98.3 F (36.8 C)  TempSrc: Oral Oral Oral Oral  SpO2: 94% 97% 92% 95%  Weight:   107.4 kg   Height:        Intake/Output Summary (Last 24 hours) at 12/14/2021 1105 Last data filed at 12/14/2021 4709 Gross per 24 hour  Intake 543 ml  Output 2800 ml  Net -2257 ml   Filed Weights   12/12/21 0130 12/13/21 0410 12/14/21 0406  Weight: 108.9 kg 108.9 kg 107.4 kg    Telemetry    Sinus bradycardia to SR V paced - Personally Reviewed  Physical Exam   Gen: no distress, morbid obesity   Neck: No JVD Cardiac: No Rubs or Gallops, no murmur, RRR +2 radial pulses Respiratory: Bilateral inspiratory wheezes,  normal effort, normal  respiratory rate GI: Soft, nontender, non-distended  MS: Non pitting edema;  moves all extremities Integument: Skin feels warm Neuro:  At time of evaluation, alert and oriented to person/place/time/situation  Psych: Normal affect, patient feels OK   Labs    Chemistry Recent Labs  Lab 12/10/21 0022 12/11/21 0319 12/12/21 0240 12/13/21 0248 12/14/21 0330  NA 139 141 137 138 141  K 5.3* 3.4* 4.2 4.0 5.1  CL 100 97* 99 99 103  CO2 26 34* 25 29 32  GLUCOSE 203* 165* 174* 137* 144*  BUN 31* 16 22 24* 25*  CREATININE 1.35* 1.30* 1.43* 1.25* 1.35*  CALCIUM 9.4 9.2 8.7* 8.7* 8.9  PROT 6.9 6.9  --   --   --   ALBUMIN 3.6 3.4*  --   --   --   AST 73* 28  --   --   --   ALT 65* 47*  --   --   --   ALKPHOS 88 84  --   --   --   BILITOT 1.1 1.1  --   --   --   GFRNONAA 41* 43* 38* 45* 41*  ANIONGAP  $'13 10 13 10 6     'L$ Hematology Recent Labs  Lab 12/10/21 0022 12/11/21 0319 12/14/21 0330  WBC 15.0* 12.5* 10.5  RBC 4.62 4.65 4.61  HGB 14.4 14.4 14.4  HCT 44.7 45.5 45.4  MCV 96.8 97.8 98.5  MCH 31.2 31.0 31.2  MCHC 32.2 31.6 31.7  RDW 14.5 14.6 14.0  PLT 274 262 284    Cardiac EnzymesNo results for input(s): "TROPONINI" in the last 168 hours. No results for input(s): "TROPIPOC" in the last 168 hours.   BNP Recent Labs  Lab 12/10/21 0022  BNP 122.2*     DDimer No results for input(s): "DDIMER" in the last 168 hours.   Radiology    CT Chest High Resolution  Result Date: 12/13/2021 CLINICAL DATA:  Dyspnea on exertion EXAM: CT CHEST WITHOUT CONTRAST TECHNIQUE: Multidetector CT imaging of the chest was performed following the standard protocol without intravenous contrast. High resolution imaging of the lungs, as well as inspiratory and expiratory imaging, was performed. RADIATION DOSE REDUCTION: This exam was performed according to the departmental dose-optimization program which includes automated exposure control, adjustment of the mA and/or kV  according to patient size and/or use of iterative reconstruction technique. COMPARISON:  10/08/2020 FINDINGS: Cardiovascular: Aortic atherosclerosis. Left chest multi lead pacer defibrillator. Cardiomegaly. Three-vessel coronary artery calcifications and stents. No pericardial effusion. Mediastinum/Nodes: Numerous unchanged prominent mediastinal and hilar lymph nodes. Thyroid gland, trachea, and esophagus demonstrate no significant findings. Lungs/Pleura: New, scattered ground-glass airspace opacities and small consolidations throughout the lungs most conspicuously in the inferior right upper lobe although seen throughout bilaterally (series 6, image 39, 61, 104). Mild, apical predominant irregular interstitial opacity and septal thickening appears similar to prior examination. Lobular air trapping on expiratory phase imaging, similar to prior examination. No pleural effusion or pneumothorax. Upper Abdomen: No acute abnormality. Coarse, nodular cirrhotic morphology of the liver. Musculoskeletal: No chest wall abnormality. No suspicious osseous lesions identified. IMPRESSION: 1. New, scattered ground-glass airspace opacities and small consolidations throughout the lungs, consistent with multifocal infection or inflammation. 2. Mild, underlying apical predominant irregular interstitial opacity and septal thickening appears similar to prior examination. Mosaic airspace attenuation and lobular air trapping on expiratory phase imaging, similar to prior examination. Constellation of findings again suggests chronic fibrotic hypersensitivity pneumonitis, if characterized by ATS pulmonary fibrosis criteria are in an alternative diagnosis (not UIP) pattern per consensus guidelines: Diagnosis of Idiopathic Pulmonary Fibrosis: An Official ATS/ERS/JRS/ALAT Clinical Practice Guideline. Len[place], Iss 5, [phone], Mar 03 2017. 3. Numerous unchanged prominent mediastinal and hilar lymph nodes, likely  reactive. 4. Cardiomegaly and coronary artery disease. 5. Coarse, nodular cirrhotic morphology of the liver. Aortic Atherosclerosis (ICD10-I70.0). Electronically Signed   By: Delanna Ahmadi M.D.   On: 12/13/2021 10:50     Patient Profile     76 y.o. female CAD, HLD with DM, HFpEF, OSA on CPA, Morbid Obesity, HTN with DM, CKD Stage IIIa, SSS s/p PPM and Persistent AF seen for dyspnea  Assessment & Plan    Mixed Hypoxic respiratory failure Concern for ILD; discussed with TRH, PCCM consult pending OSA on CPAP  Heart Failure Preserved Ejection Fraction  - (No significant LVH, diastology not performed in AF) HTN with DM CKD Stage IIIa (creatinine 1.1-1.4, still within her range) - NYHA class III, Stage C, near euvolemic, etiology mixed picture - Diuretic regimen: one time lasix 100 IV today, with plans to PO transition 12/15/21 - BMP, Mg , BMP for 12/15/21 - given kidney  function and Tikosyn, aggressive diuresis has not been pursued - K is ULN, defer aldactone today   History of pulmonary hypertension with prior queries of PVOD (not suggestive on 2021 CTPV;  -will defer CTPV this admission  Persistent AF - on 150 mg metoprolol tartrate, Tikoysn 250, and  diltiazem 360 - on eliquis  CAD - s/p RCA PCI in the past - asymptomatic, continue statin  Sick sinus syndrome S/p PPM, no issues  For questions or updates, please contact [facility] Please consult [email or web address] for contact info under Cardiology/STEMI.      Rudean Haskell, MD Cardiologist [facility] [place], [address] [place], [place] [address] [phone]  11:05 AM

## 2021-12-14 NOTE — Plan of Care (Signed)
?  Problem: Education: ?Goal: Ability to demonstrate management of disease process will improve ?Outcome: Progressing ?  ?Problem: Activity: ?Goal: Capacity to carry out activities will improve ?Outcome: Progressing ?  ?Problem: Cardiac: ?Goal: Ability to achieve and maintain adequate cardiopulmonary perfusion will improve ?Outcome: Progressing ?  ?

## 2021-12-14 NOTE — Care Management Important Message (Signed)
Important Message  Patient Details  Name: [name] MRN: [id number] Date of Birth: 11/05/1945   Medicare Important Message Given:  Yes     [name] 12/14/2021, 7:40 AM

## 2021-12-14 NOTE — Progress Notes (Addendum)
Heart Failure Stewardship Pharmacist Progress Note   PCP: [name], [name], MD PCP-Cardiologist: Mertie Moores, MD    HPI:  76 yo F with PMH of HTN, HLD, CHF, CAD s/p PCI, afib, tachybrady syndrome s/p PPM, and OSA. Heart catheterization 05/2017 with patent RCA stent and chronic occlusion of a small acute marginal branch.  She was also found to have severe pulmonary hypertension. She presented to the ED on 6/10 with shortness of breath, weight gain of 5 lbs, orthopnea, and LEE. Acute exacerbation thought to be due to dietary source. She was taking her medications as prescribed.    CXR on admission with mild cardiomegaly and mild vascular congestion. An ECHO was done on 6/10 and LVEF stable at 60-65% with normal RV.   Shortness of breath and acute respiratory failure felt consistent with CHF exacerbation and she was diuresed.  Unfortunately following diuresis she was unable to be weaned from O2. Question of ILD. May need RHC to better assess pulmonary hypertension.  CT chest on 6/13 with concerns for multifocal infection or inflammation. PCCM consulted.  Current HF Medications: Diuretic: furosemide 100 mg IV x1 Beta Blocker: metoprolol tartrate 50 mg qAM; 100 mg qPM SGLT2i: Jardiance 25 mg daily  Prior to admission HF Medications: Diuretic: torsemide 40 mg BID; metolazone 2.5 mg every 4 days PRN Beta blocker: metoprolol tartrate 50 mg qAM; 100 mg qPM SGLT2i: Jardiance 25 mg daily  Pertinent Lab Values: Serum creatinine 1.35, BUN 25, Potassium 5.1, Sodium 141, BNP 122.2, Magnesium 2.4, A1c 8.0 (08/23/21)   Vital Signs: Weight: 236 lbs (admission weight: 242 lbs) Blood pressure: 120/50s  Heart rate: 50s  I/O: -1.5L yesterday; net -8.3L  Medication Assistance / Insurance Benefits Check: Does the patient have prescription insurance?  Yes Type of insurance plan: AARP Medicare  Does the patient qualify for medication assistance through manufacturers or grants?   Pending Eligible grants  and/or patient assistance programs: pending Medication assistance applications in progress: none  Medication assistance applications approved: none Approved medication assistance renewals will be completed by: pending  Outpatient Pharmacy:  Prior to admission [facility] Is the patient willing to use [facility] at discharge? Yes Is the patient willing to transition their outpatient pharmacy to utilize a [facility] outpatient pharmacy?   Pending    Assessment: 1. Acute on chronic diastolic CHF (LVEF 10-93%). NYHA class II symptoms. - Furosemide 100 mg IV x 1 today with plans to transition to PO tomorrow. Good UOP today. Weight down. No JVD and non-pitting edema on exam. - Continue metoprolol tartrate 50 mg qAM and 100 mg qPM - Continue Jardiance 25 mg daily - Caution addition of spironolactone with K >5 today   Plan: 1) Medication changes recommended at this time: - Agree with above  2) Patient assistance: - Patient is in the donut hole - Entresto copay until out of the donut hole $178.60 - Jardiance (refill too soon to provide test claim) - Eliquis (refill too soon to provide test claim) - Spoke with patient and she is not interested in pursuing additional assistance at this time. She stated she just paid for 3 months worth of Eliquis and Jardiance.  3)  Education  - Patient has been educated on current HF medications and potential additions to HF medication regimen - Patient verbalizes understanding that over the next few months, these medication doses may change and more medications may be added to optimize HF regimen - Patient has been educated on basic disease  state pathophysiology and goals of therapy   Kerby Nora, PharmD, BCPS Heart Failure Stewardship Pharmacist Phone [phone]

## 2021-12-14 NOTE — Progress Notes (Signed)
Patient resting well on home CPAP. 

## 2021-12-15 ENCOUNTER — Telehealth: Payer: Self-pay | Admitting: Pulmonary Disease

## 2021-12-15 DIAGNOSIS — I272 Pulmonary hypertension, unspecified: Secondary | ICD-10-CM | POA: Diagnosis not present

## 2021-12-15 DIAGNOSIS — G4733 Obstructive sleep apnea (adult) (pediatric): Secondary | ICD-10-CM | POA: Diagnosis not present

## 2021-12-15 DIAGNOSIS — J849 Interstitial pulmonary disease, unspecified: Secondary | ICD-10-CM

## 2021-12-15 DIAGNOSIS — J8410 Pulmonary fibrosis, unspecified: Secondary | ICD-10-CM | POA: Diagnosis not present

## 2021-12-15 DIAGNOSIS — I480 Paroxysmal atrial fibrillation: Secondary | ICD-10-CM | POA: Diagnosis not present

## 2021-12-15 LAB — BASIC METABOLIC PANEL
Anion gap: 8 (ref 5–15)
BUN: 24 mg/dL — ABNORMAL HIGH (ref 8–23)
CO2: 26 mmol/L (ref 22–32)
Calcium: 8.6 mg/dL — ABNORMAL LOW (ref 8.9–10.3)
Chloride: 101 mmol/L (ref 98–111)
Creatinine, Ser: 1.22 mg/dL — ABNORMAL HIGH (ref 0.44–1.00)
GFR, Estimated: 46 mL/min — ABNORMAL LOW (ref >60)
Glucose, Bld: 144 mg/dL — ABNORMAL HIGH (ref 70–99)
Potassium: 4.3 mmol/L (ref 3.5–5.1)
Sodium: 135 mmol/L (ref 135–145)

## 2021-12-15 LAB — GLUCOSE, CAPILLARY
Glucose-Capillary: 163 mg/dL — ABNORMAL HIGH (ref 70–99)
Glucose-Capillary: 131 mg/dL — ABNORMAL HIGH (ref 70–99)
Glucose-Capillary: 142 mg/dL — ABNORMAL HIGH (ref 70–99)
Glucose-Capillary: 187 mg/dL — ABNORMAL HIGH (ref 70–99)

## 2021-12-15 LAB — CBC
HCT: 41.3 % (ref 36.0–46.0)
Hemoglobin: 13.4 g/dL (ref 12.0–15.0)
MCH: 31.2 pg (ref 26.0–34.0)
MCHC: 32.4 g/dL (ref 30.0–36.0)
MCV: 96.3 fL (ref 80.0–100.0)
Platelets: 253 10*3/uL (ref 150–400)
RBC: 4.29 MIL/uL (ref 3.87–5.11)
RDW: 14 % (ref 11.5–15.5)
WBC: 8.4 10*3/uL (ref 4.0–10.5)
nRBC: 0 % (ref 0.0–0.2)

## 2021-12-15 LAB — CYCLIC CITRUL PEPTIDE ANTIBODY, IGG/IGA: CCP Antibodies IgG/IgA: 44 units — ABNORMAL HIGH (ref 0–19)

## 2021-12-15 LAB — ANA W/REFLEX IF POSITIVE: Anti Nuclear Antibody (ANA): NEGATIVE

## 2021-12-15 LAB — MAGNESIUM: Magnesium: 2.7 mg/dL — ABNORMAL HIGH (ref 1.7–2.4)

## 2021-12-15 LAB — BRAIN NATRIURETIC PEPTIDE: B Natriuretic Peptide: 66.6 pg/mL (ref 0.0–100.0)

## 2021-12-15 MED ORDER — TORSEMIDE 20 MG PO TABS
20.0000 mg | ORAL_TABLET | Freq: Two times a day (BID) | ORAL | Status: DC
Start: 2021-12-15 — End: 2021-12-16
  Administered 2021-12-15 – 2021-12-16 (×3): 20 mg via ORAL
  Filled 2021-12-15 (×3): qty 1

## 2021-12-15 NOTE — Progress Notes (Signed)
Heart Failure Stewardship Pharmacist Progress Note   PCP: [name], [name], MD PCP-Cardiologist: Mertie Moores, MD    HPI:  76 yo F with PMH of HTN, HLD, CHF, CAD s/p PCI, afib, tachybrady syndrome s/p PPM, and OSA. Heart catheterization 05/2017 with patent RCA stent and chronic occlusion of a small acute marginal branch.  She was also found to have severe pulmonary hypertension. She presented to the ED on 6/10 with shortness of breath, weight gain of 5 lbs, orthopnea, and LEE. Acute exacerbation thought to be due to dietary source. She was taking her medications as prescribed.    CXR on admission with mild cardiomegaly and mild vascular congestion. An ECHO was done on 6/10 and LVEF stable at 60-65% with normal RV.   Shortness of breath and acute respiratory failure felt consistent with CHF exacerbation and she was diuresed.  Unfortunately following diuresis she was unable to be weaned from O2. Question of ILD. May need RHC to better assess pulmonary hypertension.  CT chest on 6/13 with concerns for multifocal infection or inflammation. PCCM consulted.  Current HF Medications: Diuretic: torsemide 20 mg BID Beta Blocker: metoprolol tartrate 50 mg qAM; 100 mg qPM SGLT2i: Jardiance 25 mg daily  Prior to admission HF Medications: Diuretic: torsemide 40 mg BID; metolazone 2.5 mg every 4 days PRN Beta blocker: metoprolol tartrate 50 mg qAM; 100 mg qPM SGLT2i: Jardiance 25 mg daily  Pertinent Lab Values: Serum creatinine 1.22, BUN 24, Potassium 4.3, Sodium 135, BNP 122.2, Magnesium 2.7, A1c 8.0 (08/23/21)   Vital Signs: Weight: 239 lbs (admission weight: 242 lbs) Blood pressure: 120-150/50s  Heart rate: 50-60s  I/O: -3.4L yesterday; net -9L  Medication Assistance / Insurance Benefits Check: Does the patient have prescription insurance?  Yes Type of insurance plan: AARP Medicare  Does the patient qualify for medication assistance through manufacturers or grants?   Pending Eligible  grants and/or patient assistance programs: pending Medication assistance applications in progress: none  Medication assistance applications approved: none Approved medication assistance renewals will be completed by: pending  Outpatient Pharmacy:  [facility] Is the patient willing to use [facility] at discharge? Yes Is the patient willing to transition their outpatient pharmacy to utilize a [facility] outpatient pharmacy?   Pending    Assessment: 1. Acute on chronic diastolic CHF (LVEF 81-82%). NYHA class II symptoms. - Agree with transitioning to torsemide 20 mg BID. Good UOP. Weight up but question accuracy. No JVD and non-pitting edema on exam. - Continue metoprolol tartrate 50 mg qAM and 100 mg qPM - Continue Jardiance 25 mg daily - Consider addition of spironolactone 12.5 mg daily Plan: 1) Medication changes recommended at this time: - Start spironolactone 12.5 mg daily  2) Patient assistance: - Patient is in the donut hole - Entresto copay until out of the donut hole $178.60 - Jardiance (refill too soon to provide test claim) - Eliquis (refill too soon to provide test claim) - Spoke with patient and she is not interested in pursuing additional assistance at this time. She stated she just paid for 3 months worth of Eliquis and Jardiance.  3)  Education  - Patient has been educated on current HF medications and potential additions to HF medication regimen - Patient verbalizes understanding that over the next few months, these medication doses may change and more medications may be added to optimize HF regimen - Patient has been educated on basic disease state pathophysiology and goals of therapy  Kerby Nora, PharmD, BCPS Heart Failure Stewardship Pharmacist Phone [phone]

## 2021-12-15 NOTE — Progress Notes (Addendum)
Progress Note   Patient: [name] DJM:[id number] DOB: [birth year] DOA: 12/10/2021     5 DOS: the patient was seen and examined on 12/15/2021   Brief hospital course: 75/F history of chronic diastolic CHF, paroxysmal A-fib on Eliquis, tachybradycardia syndrome with PPM, sleep apnea, CAD, hypertension, dyslipidemia and obesity presented to the ED with dyspnea on exertion, orthopnea and leg swelling, she is compliant with Lasix and metolazone. -In the ED she was noted to be hypoxic with sats in the 80s required nonrebreather mask initially, subsequently weaned down to 6 to 7 L O2, BNP was 122, troponin was 4, chest x-ray noted mild pulmonary vascular congestion -Admitted, started on diuretics, cards following    Assessment and Plan: Acute hypoxic respiratory failure Acute on chronic diastolic CHF Pulmonary fibrosis on high-resolution CT -?  Severe PAH noted on right heart cath 2018 -2D echo with EF of 60-65%, indeterminate diastolic parameters, RV size and function is normal -Clinically improving,, she is 5.4 L negative clinically improving, still requiring 7 to 8 L of O2, CT chest from 4/22 concerning for possible pulmonary fibrosis and this was confirmed on high-resolution CT.  Of note patient states that she had RSV in 2020 and briefly followed up with Dr. Chase Caller at the pulmonology office.  We will consult pulmonology team-may benefit from steroid rxn -Discussed with cardiology and suspect that there may be a component of noncardiogenic issues contributing to patient's pulmonary symptoms and require need for O2 therefore suspect we have reached an endpoint with diuresis.  -Continue Jardiance; continue Lopressor and assess urine output/response to oral Demadex. -Cardiology following; appreciate assistance and recommendation. -Continue to wean off oxygen supplement as tolerated; follow desaturation screening and continue the use of flutter valve/incentive spirometer.   History of pulmonary  hypertension/interstitial lung disease -On right heart cath 2018, echo this admission without suggestion. -Continue outpatient follow-up with pulmonologist.   Hyperkalemia, corrected now hypokalemic -Continue to follow potassium trend.   Paroxysmal atrial fibrillation on anticoagulation tachybradycardia syndrome s/p permanent pacemaker -Patient appears to be rate controlled at this time.  Status post multiple ablations.  Last device interrogation noted normal function 10/06/2021.   -Continue Tikosyn and Eliquis -Electrolytes goal is to keep K> 4 and mag> 2 -Follow trend.   CAD -Patient with prior PCI to RCA.  Noted to have coronary CTA back in 06/2020 -Continue anticoagulation and statin -Patient denies chest pain.   Diabetes mellitus type 2, with hyperlipidemia - Last hemoglobin A1c 8 on 08/23/21. -Continue insulin scale insulin. -Continue Jardiance; holding metformin -Follow CBGs and further adjust hypoglycemic regimen as needed -Modified carbohydrate diet discussed with patient  Essential Hypertension -Stable labetalol -Continue to follow vital signs.   Chronic kidney disease stage IIIb -baseline creatinine 1.1-1.4 -Slight increase in creatinine appreciated after diuresis on 12/14/2021; diuretics transition to oral route and back to baseline today. -Continue to follow urine output.  OSA -continue CPAP QHS.   Obesity -Body mass index is 38.64 kg/m. -Low calorie diet, portion control and increase physical activity.   GERD -Continue PPI.   Subjective: Feeling short winded with activity; still requiring oxygen supplementation and expressing increasing her urine output.  No chest pain, no nausea, no vomiting.  Physical Exam: Vitals:   12/15/21 1052 12/15/21 1109 12/15/21 1518 12/15/21 1700  BP: (!) 125/49 140/63 99/81 115/69  Pulse: (!) 59 (!) 56 (!) 59   Resp: '19 20 17   '$ Temp: 98.2 F (36.8 C) 98 F (36.7 C) 98 F (36.7 C)   TempSrc:  Oral Oral Oral   SpO2:  91%  90%   Weight:      Height:       General exam: Alert, awake, oriented x 3; still short winded with activity and requiring oxygen supplementation; down to 2-3 L at rest.  Reports good urine output and denies chest pain, nausea and vomiting. Respiratory system: Decreased breath sounds at the bases without frank crackles; no wheezing, positive scattered rhonchi. Cardiovascular system:RRR.  Rubs or gallops, no JVD on exam. Gastrointestinal system: Abdomen is obese, nondistended, soft and nontender. No organomegaly or masses felt. Normal bowel sounds heard. Central nervous system: Alert and oriented. No focal neurological deficits. Extremities: No cyanosis clubbing; no edema. Skin: No petechiae. Psychiatry: Judgement and insight appear normal. Mood & affect appropriate.   Data Reviewed: CBC: WBCs 8.4, hemoglobin 13.4, platelet count 253 K. Basic metabolic panel: Sodium 224, potassium 4.3, BUN 24, creatinine 1.22 and GFR 46. BNP 66.6 Magnesium 2.7 CBG in the 130-160 range. Family Communication: Patient only  Disposition: Status is: Inpatient Remains inpatient appropriate because: Great response to the use of IV diuretics; continue to wean off oxygen supplementation and check for desaturation screening.  We will attempt oral diuretics and assess urine output and response.  Hopefully home in the next 24 to 48 hours.   Planned Discharge Destination: Home  DVT prophylaxis: Chronically on Eliquis.  Author: Barton Dubois, MD 12/15/2021 5:57 PM

## 2021-12-15 NOTE — Telephone Encounter (Signed)
Please arrange for hospital follow-up in 2 to 3 weeks with Dr. Chase Caller or APP Acute on chronic diastolic heart failure ILD

## 2021-12-15 NOTE — Progress Notes (Signed)
[name], MRN:  [id number], DOB:  1946-04-05, LOS: 5 ADMISSION DATE:  12/10/2021, CONSULTATION DATE:  6/14 REFERRING MD:  Dr Roger Shelter TRH, CHIEF COMPLAINT:  hypoxia   History of Present Illness:  76 year old female with PMH as below, which is significant for CHF, OSA on CPAP, MI, and atrial fibrillation. Most of her history centers around cardiac issues. She undergone several ablations and cardioversions for AF. She has a pacemaker.  She has been treated with amiodarone briefly and Tikosyn. Also has had PCI for MI on two occasions. She had viral pneumonia secondary to RSV in 2020 and has had intermittent breathing troubles since then, but admits they may have been fluid related considering CHF history. She is compliant with CPAP. She has been seen by Dr. Chase Caller in the pulmonary clinic for ground glass type changes on CT after her RSV admission. He has been following serial CT, but has not given a diagnosis such as fibrosis.   She presented to [facility] 6/10 with complaints of shortness of breath and dizziness. Admitted to the hospitalist service for acute on chronic HFpEF and treated with diuretics. She did require BiPAP at the time of admission. Cardiology was consulted and has guided diuresis. As of 6/14 she had diuresed well as she was 8.5 liters negative, however, she still required 4L [place] to keep sats in the low to mid 90s. PCCM was consulted for further evaluation.   She denies cough, fever, chills. Does not report any environmental exposures like pets or mold. Did have lower extremity edema, which has improved. No sick contacts. Only lives with her husband. Very brief smoking history for 5 years and quit in 1984.  Pertinent  Medical History   has a past medical history of Anticoagulant long-term use, Anxiety, Arthritis, CAD (coronary artery disease) (6256,3893), CHF (congestive heart failure) ([place]), Chronic atrial fibrillation ([place]) (06/2007), Chronic kidney disease, CVA (cerebral  vascular accident) ([place]) (7342,8768), Depression, Diplopia (06/19/2008), Dysrhythmia, Edema of lower extremity, Hyperlipidemia, Hypertension, Inferior myocardial infarction [facility]), Myocardial infarction ([place]) (1157,2620), Obesity, OSA on CPAP, Pacemaker, Pneumonia (2014), Pulmonary hypertension ([place]), Shortness of breath, Skin cancer, Sleep apnea, Spondylolisthesis, TIA (transient ischemic attack) (2008), and Unspecified hemorrhoids without mention of complication (3/55/9741).   Significant Hospital Events: Including procedures, antibiotic start and stop dates in addition to other pertinent events   Echo 6/10 > LVEF 60-65%, Normal valves. RV, LV appear normal.  HRCT 6/13 independently reviewed shows scattered groundglass opacities bilateral consistent with multifocal inflammation and unchanged prior appearance of septal thickening and mosaic attenuation with lobular air trapping on expiration.  Interestingly nodular cirrhotic morphology of the liver was noted 06/2020 CT cors/pulmonary vein >> atypical left pulmonary vein drainage into left atrium  Interim History / Subjective:   Afebrile No cough, dyspnea at baseline Saturation 91 to 94% on 3 L nasal cannula  Objective   Blood pressure 140/63, pulse (!) 56, temperature 98 F (36.7 C), temperature source Oral, resp. rate 20, height '5\' 6"'$  (1.676 m), weight 108.6 kg, SpO2 91 %.        Intake/Output Summary (Last 24 hours) at 12/15/2021 1447 Last data filed at 12/15/2021 1243 Gross per 24 hour  Intake 680.41 ml  Output 2000 ml  Net -1319.59 ml    Filed Weights   12/13/21 0410 12/14/21 0406 12/15/21 0013  Weight: 108.9 kg 107.4 kg 108.6 kg    Examination: General: overweight elderly appearing female in [place]: [place]/AT, PERRL, no JVD Lungs: Crackles right base,  no accessory muscle use Cardiovascular: S1-S2 irregular, no murmur Abdomen: Soft, non-tender, non-distended Extremities: No acute deformity, trace lower extremity edema.   Neuro: Alert, oriented, non-focal. Very pleasant.    Show normal electrolytes, stable renal function creatinine 1.2, no leukocytosis  ANA negative    Resolved Hospital Problem list     Assessment & Plan:   Acute hypoxemic respiratory failure: multifactorial. Majority of her symptoms likely derive from CHF exacerbation as she has improved quite a bit clinically with 8+ liters of diuresis. However, she does have abnormal GGO on her HRCT from 6/13. This is not significantly different from scans she has had over the past couple of years, in fact, sometimes it has been worse than this on imaging. Does not clearly resemble pulmonary fibrosis. She had serum autoimmune markers sent a couple years ago with some abnormal results.  - Continue supplemental O2 to keep sats > 92% - No role for steroids or antibiotics at this time -Repeat serology pending - Will continue to monitor as she is further diuresed - Suspect she may ultimately need to be discharged on oxygen and follow up in the pulmonary clinic.   Chronic underlying ILD -HRCT favors "alternative" diagnosis, air trapping and mosaic attenuation favors chronic hypersensitive pneumonitis.  Detailed environmental history obtained, no trigger identified She does not have stigmata of collagen vascular disease  -Would not use empiric steroids in this setting.  History of pulmonary hypertension with anomalous drainage noted on CT PV -Per cardiology, this does not represent PVOD -Echo not suggestive -Does she need right heart cath?  OSA on CPAP - continue home CPAP settings  Acute on chronic HFpEF Atrial fibrillation - Management per cardiology, TRH  Dm - per primary  Outpatient pulmonary clinic follow-up will be scheduled  Labs   CBC: Recent Labs  Lab 12/10/21 0022 12/11/21 0319 12/14/21 0330 12/15/21 0403  WBC 15.0* 12.5* 10.5 8.4  HGB 14.4 14.4 14.4 13.4  HCT 44.7 45.5 45.4 41.3  MCV 96.8 97.8 98.5 96.3  PLT 274 262 284 253      Basic Metabolic Panel: Recent Labs  Lab 12/11/21 0319 12/12/21 0240 12/13/21 0248 12/14/21 0330 12/15/21 0403  NA 141 137 138 141 135  K 3.4* 4.2 4.0 5.1 4.3  CL 97* 99 99 103 101  CO2 34* 25 29 32 26  GLUCOSE 165* 174* 137* 144* 144*  BUN 16 22 24* 25* 24*  CREATININE 1.30* 1.43* 1.25* 1.35* 1.22*  CALCIUM 9.2 8.7* 8.7* 8.9 8.6*  MG  --  2.4  --   --  2.7*    GFR: Estimated Creatinine Clearance: 49.7 mL/min (A) (by C-G formula based on SCr of 1.22 mg/dL (H)). Recent Labs  Lab 12/10/21 0022 12/11/21 0319 12/14/21 0330 12/15/21 0403  WBC 15.0* 12.5* 10.5 8.4     Liver Function Tests: Recent Labs  Lab 12/10/21 0022 12/11/21 0319  AST 73* 28  ALT 65* 47*  ALKPHOS 88 84  BILITOT 1.1 1.1  PROT 6.9 6.9  ALBUMIN 3.6 3.4*    No results for input(s): "LIPASE", "AMYLASE" in the last 168 hours. No results for input(s): "AMMONIA" in the last 168 hours.  ABG    Component Value Date/Time   PHART 7.456 (H) 05/19/2020 1545   PCO2ART 51.6 (H) 05/19/2020 1545   PO2ART 60 (L) 05/19/2020 1545   HCO3 36.1 (H) 05/19/2020 1545   TCO2 38 (H) 05/19/2020 1545   O2SAT 90.0 05/19/2020 1545     Coagulation Profile: No results for  input(s): "INR", "PROTIME" in the last 168 hours.  Cardiac Enzymes: No results for input(s): "CKTOTAL", "CKMB", "CKMBINDEX", "TROPONINI" in the last 168 hours.  HbA1C: Hgb A1c MFr Bld  Date/Time Value Ref Range Status  08/23/2021 09:35 AM 8.0 (H) 4.6 - 6.5 % Final    Comment:    Glycemic Control Guidelines for People with Diabetes:Non Diabetic:  <6%Goal of Therapy: <7%Additional Action Suggested:  >8%   04/20/2021 11:30 AM 8.2 (H) 4.6 - 6.5 % Final    Comment:    Glycemic Control Guidelines for People with Diabetes:Non Diabetic:  <6%Goal of Therapy: <7%Additional Action Suggested:  >8%     CBG: Recent Labs  Lab 12/14/21 1124 12/14/21 1606 12/14/21 2105 12/15/21 0610 12/15/21 1112  GLUCAP 137* 152* 167* 163* 131*   Kara Mead  MD. FCCP. [facility] Pager : [phone]  If no response to pager , please call [phone] until 7 pm After 7:00 pm call Elink  [phone]   12/15/2021  12/15/2021 2:47 PM

## 2021-12-15 NOTE — Progress Notes (Signed)
Mobility Specialist Progress Note:   12/15/21 1239  Mobility  Activity Ambulated with assistance in hallway  Level of Assistance Independent after set-up  Assistive Device Front wheel walker  Distance Ambulated (ft) 500 ft  Activity Response Tolerated well  $Mobility charge 1 Mobility   Pt received in chair willing to participate in mobility. No complaints of pain. Left in chair with call bell in reach and all needs met.   [facility] [name] Mobility Specialist

## 2021-12-15 NOTE — Progress Notes (Signed)
SATURATION QUALIFICATIONS: (This note is used to comply with regulatory documentation for home oxygen)  Patient Saturations on Room Air at Rest = 92%  Patient Saturations on Room Air while Ambulating = 87%  Patient Saturations on 2 Liters of oxygen while Ambulating = 90%  Please briefly explain why patient needs home oxygen:  Pt requires 2L O2 via [place] to maintain SpO2 >90%O2 with mobilization.  Kiersten Coss B. Migdalia Dk PT, DPT Acute Rehabilitation Services Please use secure chat or  Call Office [phone]

## 2021-12-15 NOTE — Progress Notes (Signed)
Progress Note  Patient Name: [name] Date of Encounter: 12/15/2021  Primary Cardiologist: Mertie Moores, MD   Subjective   Saw PCCM, getting lab testing. Feels better. Nervous but optimistic about lung testing.  Inpatient Medications    Scheduled Meds:  apixaban  5 mg Oral BID   atorvastatin  40 mg Oral Daily   chlorhexidine  15 mL Mouth Rinse BID   diltiazem  360 mg Oral Daily   dofetilide  250 mcg Oral BID   DULoxetine  60 mg Oral Daily   empagliflozin  25 mg Oral Daily   insulin aspart  0-15 Units Subcutaneous TID WC   mouth rinse  15 mL Mouth Rinse q12n4p   metoprolol tartrate  50 mg Oral q morning   And   metoprolol tartrate  100 mg Oral QHS   pantoprazole  40 mg Oral BID   polyvinyl alcohol  1 drop Both Eyes QHS   potassium chloride  20 mEq Oral Daily   sodium chloride flush  3 mL Intravenous Q12H   Continuous Infusions:   PRN Meds: acetaminophen **OR** acetaminophen, albuterol, fluticasone, loratadine, nystatin-triamcinolone   Vital Signs    Vitals:   12/15/21 0013 12/15/21 0017 12/15/21 0435 12/15/21 0818  BP:  (!) 152/65 (!) 108/45 (!) 124/48  Pulse:  (!) 55 (!) 57 63  Resp:  18 19   Temp:  97.6 F (36.4 C)    TempSrc:  Oral    SpO2:  94% 93%   Weight: 108.6 kg     Height:        Intake/Output Summary (Last 24 hours) at 12/15/2021 0835 Last data filed at 12/15/2021 0800 Gross per 24 hour  Intake 740.41 ml  Output 2100 ml  Net -1359.59 ml   Filed Weights   12/13/21 0410 12/14/21 0406 12/15/21 0013  Weight: 108.9 kg 107.4 kg 108.6 kg    Telemetry    Sinus bradycardia to SR  - Personally Reviewed  Physical Exam   Gen: no distress, morbid obesity   Neck: No JVD Cardiac: No Rubs or Gallops, no murmur, RRR +2 radial pulses Respiratory: Bilateral inspiratory wheezes, normal effort, normal  respiratory rate GI: Soft, nontender, non-distended  MS: Non pitting edema;  moves all extremities Integument: Skin feels warm Neuro:  At time  of evaluation, alert and oriented to person/place/time/situation  Psych: Normal affect, patient feels OK   Labs    Chemistry Recent Labs  Lab 12/10/21 0022 12/11/21 0319 12/12/21 0240 12/13/21 0248 12/14/21 0330 12/15/21 0403  NA 139 141   < > 138 141 135  K 5.3* 3.4*   < > 4.0 5.1 4.3  CL 100 97*   < > 99 103 101  CO2 26 34*   < > 29 32 26  GLUCOSE 203* 165*   < > 137* 144* 144*  BUN 31* 16   < > 24* 25* 24*  CREATININE 1.35* 1.30*   < > 1.25* 1.35* 1.22*  CALCIUM 9.4 9.2   < > 8.7* 8.9 8.6*  PROT 6.9 6.9  --   --   --   --   ALBUMIN 3.6 3.4*  --   --   --   --   AST 73* 28  --   --   --   --   ALT 65* 47*  --   --   --   --   ALKPHOS 88 84  --   --   --   --  BILITOT 1.1 1.1  --   --   --   --   GFRNONAA 41* 43*   < > 45* 41* 46*  ANIONGAP 13 10   < > '10 6 8   '$ < > = values in this interval not displayed.     Hematology Recent Labs  Lab 12/11/21 0319 12/14/21 0330 12/15/21 0403  WBC 12.5* 10.5 8.4  RBC 4.65 4.61 4.29  HGB 14.4 14.4 13.4  HCT 45.5 45.4 41.3  MCV 97.8 98.5 96.3  MCH 31.0 31.2 31.2  MCHC 31.6 31.7 32.4  RDW 14.6 14.0 14.0  PLT 262 284 253    Cardiac EnzymesNo results for input(s): "TROPONINI" in the last 168 hours. No results for input(s): "TROPIPOC" in the last 168 hours.   BNP Recent Labs  Lab 12/10/21 0022 12/15/21 0403  BNP 122.2* 66.6     DDimer No results for input(s): "DDIMER" in the last 168 hours.   Radiology    CT Chest High Resolution  Result Date: 12/13/2021 CLINICAL DATA:  Dyspnea on exertion EXAM: CT CHEST WITHOUT CONTRAST TECHNIQUE: Multidetector CT imaging of the chest was performed following the standard protocol without intravenous contrast. High resolution imaging of the lungs, as well as inspiratory and expiratory imaging, was performed. RADIATION DOSE REDUCTION: This exam was performed according to the departmental dose-optimization program which includes automated exposure control, adjustment of the mA and/or kV  according to patient size and/or use of iterative reconstruction technique. COMPARISON:  10/08/2020 FINDINGS: Cardiovascular: Aortic atherosclerosis. Left chest multi lead pacer defibrillator. Cardiomegaly. Three-vessel coronary artery calcifications and stents. No pericardial effusion. Mediastinum/Nodes: Numerous unchanged prominent mediastinal and hilar lymph nodes. Thyroid gland, trachea, and esophagus demonstrate no significant findings. Lungs/Pleura: New, scattered ground-glass airspace opacities and small consolidations throughout the lungs most conspicuously in the inferior right upper lobe although seen throughout bilaterally (series 6, image 39, 61, 104). Mild, apical predominant irregular interstitial opacity and septal thickening appears similar to prior examination. Lobular air trapping on expiratory phase imaging, similar to prior examination. No pleural effusion or pneumothorax. Upper Abdomen: No acute abnormality. Coarse, nodular cirrhotic morphology of the liver. Musculoskeletal: No chest wall abnormality. No suspicious osseous lesions identified. IMPRESSION: 1. New, scattered ground-glass airspace opacities and small consolidations throughout the lungs, consistent with multifocal infection or inflammation. 2. Mild, underlying apical predominant irregular interstitial opacity and septal thickening appears similar to prior examination. Mosaic airspace attenuation and lobular air trapping on expiratory phase imaging, similar to prior examination. Constellation of findings again suggests chronic fibrotic hypersensitivity pneumonitis, if characterized by ATS pulmonary fibrosis criteria are in an alternative diagnosis (not UIP) pattern per consensus guidelines: Diagnosis of Idiopathic Pulmonary Fibrosis: An Official ATS/ERS/JRS/ALAT Clinical Practice Guideline. [place], Iss 5, [phone], Mar 03 2017. 3. Numerous unchanged prominent mediastinal and hilar lymph nodes, likely  reactive. 4. Cardiomegaly and coronary artery disease. 5. Coarse, nodular cirrhotic morphology of the liver. Aortic Atherosclerosis (ICD10-I70.0). Electronically Signed   By: Delanna Ahmadi M.D.   On: 12/13/2021 10:50     Patient Profile     76 y.o. female CAD, HLD with DM, HFpEF, OSA on CPA, Morbid Obesity, HTN with DM, CKD Stage IIIa, SSS s/p PPM and Persistent AF seen for dyspnea  Assessment & Plan    Mixed Hypoxic respiratory failure Concern for ILD and mixed II/III PH; discussed with TRH, PCCM consult pending OSA on CPAP  Heart Failure Preserved Ejection Fraction  - (No significant LVH, diastology not performed  in AF) HTN with DM CKD Stage IIIa (creatinine 1.1-1.4, still within her range) - NYHA class III, Stage C, euvolemic, etiology mixed picture - Diuretic regimen: torsemide PO BID for maintenance - BNP 67  Persistent AF - on 150 mg metoprolol tartrate, Tikoysn 250, and diltiazem 360 - on eliquis  CAD - s/p RCA PCI in the past - asymptomatic, continue statin  Sick sinus syndrome S/p PPM, no issues    [facility] will sign off.   Medication Recommendations:  torsemide 20 mg PO BID, has metolaztone 2.5 mg PO PRN edema/weight gain Other recommendations (labs, testing, etc):  BMP, Mg  Follow up as an outpatient:  Has 12/26/21 follow up 9:30 AM    For questions or updates, please contact [facility] Please consult [email or web address] for contact info under Cardiology/STEMI.      Rudean Haskell, MD Cardiologist [facility] [address], [address] [place], [place] [address] [phone]  8:35 AM

## 2021-12-15 NOTE — Progress Notes (Signed)
Physical Therapy Treatment Patient Details Name: [name] MRN: [id number] DOB: 06-01-1946 Today's Date: 12/15/2021   History of Present Illness 76 yo admitted 6/10 with DOE, edema and CHF exacerbation. PMHx:chronic diastolic CHF, paroxysmal A-fib on Eliquis, tachybradycardia syndrome with PPM, sleep apnea, CAD, HTN, HLD and obesity    PT Comments    Pt is making good progress towards her goals and has been able to progress her mobility. Pt is limited however by increased O2 demand with ambulation. Pt is very gregarious and likes to hold a conversation with ambulation which increases her RR to 63s and decreases her O2 demand. Pt currently requiring 2L O2 via [place] for ambulation.  Pt able to maintain SpO2 >90% on RA at rest with increased cuing for deep purse lipped breathing. PT encouraged pt to utilize IS to improve her inspiration volume. Pt currently able to draw 1533m with increased concentration. Left pt on RA at end of session, and educated to alarm that rings when her O2 saturation drops below 90%O2. Pt encouraged to try to bring up O2 saturation with deep breathing if her O2 levels drop. If she is not able to, she should replace her [place] which was left at 2L O2. Discharge plans remain appropriate.   Recommendations for follow up therapy are one component of a multi-disciplinary discharge planning process, led by the attending physician.  Recommendations may be updated based on patient status, additional functional criteria and insurance authorization.  Follow Up Recommendations  No PT follow up     Assistance Recommended at Discharge PRN  Patient can return home with the following Help with stairs or ramp for entrance;A little help with bathing/dressing/bathroom;Assistance with cooking/housework   Equipment Recommendations  None recommended by PT       Precautions / Restrictions Precautions Precautions: Other (comment) Precaution Comments: watch sats Restrictions Weight Bearing  Restrictions: No     Mobility  Bed Mobility Overal bed mobility: Modified Independent                  Transfers Overall transfer level: Modified independent                      Ambulation/Gait Ambulation/Gait assistance: Supervision Gait Distance (Feet): 500 Feet Assistive device: None Gait Pattern/deviations: Step-through pattern, Decreased stride length Gait velocity: WFL Gait velocity interpretation: >2.62 ft/sec, indicative of community ambulatory   General Gait Details: 2x standing rest breaks, cues for decreased conversation, and deep breathing      Balance Overall balance assessment: No apparent balance deficits (not formally assessed)                                          Cognition Arousal/Alertness: Awake/alert Behavior During Therapy: WFL for tasks assessed/performed Overall Cognitive Status: Within Functional Limits for tasks assessed                                          Exercises Other Exercises Other Exercises: IS x5 max inhalation 15032m       Pertinent Vitals/Pain Pain Assessment Pain Assessment: No/denies pain     PT Goals (current goals can now be found in the care plan section) Acute Rehab PT Goals Patient Stated Goal: return home, read PT Goal Formulation: With patient Time  For Goal Achievement: 12/26/21 Potential to Achieve Goals: Good Progress towards PT goals: Progressing toward goals    Frequency    Min 2X/week      PT Plan Current plan remains appropriate       AM-PAC PT "6 Clicks" Mobility   Outcome Measure  Help needed turning from your back to your side while in a flat bed without using bedrails?: None Help needed moving from lying on your back to sitting on the side of a flat bed without using bedrails?: None Help needed moving to and from a bed to a chair (including a wheelchair)?: None Help needed standing up from a chair using your arms (e.g., wheelchair  or bedside chair)?: None Help needed to walk in hospital room?: A Little Help needed climbing 3-5 steps with a railing? : A Lot 6 Click Score: 21    End of Session Equipment Utilized During Treatment: Oxygen Activity Tolerance: Patient tolerated treatment well Patient left: in chair;with call bell/phone within reach Nurse Communication: Mobility status PT Visit Diagnosis: Other abnormalities of gait and mobility (R26.89)     Time: 7782-4235 PT Time Calculation (min) (ACUTE ONLY): 31 min  Charges:  $Gait Training: 8-22 mins $Therapeutic Exercise: 8-22 mins                     [name] B. Migdalia Dk PT, DPT Acute Rehabilitation Services Please use secure chat or  Call Office [phone]    [place] 12/15/2021, 5:28 PM

## 2021-12-16 ENCOUNTER — Other Ambulatory Visit (HOSPITAL_COMMUNITY): Payer: Self-pay

## 2021-12-16 DIAGNOSIS — E669 Obesity, unspecified: Secondary | ICD-10-CM

## 2021-12-16 LAB — BASIC METABOLIC PANEL
Anion gap: 7 (ref 5–15)
BUN: 26 mg/dL — ABNORMAL HIGH (ref 8–23)
CO2: 29 mmol/L (ref 22–32)
Calcium: 8.6 mg/dL — ABNORMAL LOW (ref 8.9–10.3)
Chloride: 103 mmol/L (ref 98–111)
Creatinine, Ser: 1.31 mg/dL — ABNORMAL HIGH (ref 0.44–1.00)
GFR, Estimated: 42 mL/min — ABNORMAL LOW (ref >60)
Glucose, Bld: 151 mg/dL — ABNORMAL HIGH (ref 70–99)
Potassium: 4.5 mmol/L (ref 3.5–5.1)
Sodium: 139 mmol/L (ref 135–145)

## 2021-12-16 LAB — CBC
HCT: 40.3 % (ref 36.0–46.0)
Hemoglobin: 13.1 g/dL (ref 12.0–15.0)
MCH: 31.1 pg (ref 26.0–34.0)
MCHC: 32.5 g/dL (ref 30.0–36.0)
MCV: 95.7 fL (ref 80.0–100.0)
Platelets: 255 10*3/uL (ref 150–400)
RBC: 4.21 MIL/uL (ref 3.87–5.11)
RDW: 13.9 % (ref 11.5–15.5)
WBC: 8 10*3/uL (ref 4.0–10.5)
nRBC: 0 % (ref 0.0–0.2)

## 2021-12-16 LAB — GLUCOSE, CAPILLARY: Glucose-Capillary: 163 mg/dL — ABNORMAL HIGH (ref 70–99)

## 2021-12-16 LAB — BRAIN NATRIURETIC PEPTIDE: B Natriuretic Peptide: 67.3 pg/mL (ref 0.0–100.0)

## 2021-12-16 MED ORDER — POTASSIUM CHLORIDE CRYS ER 20 MEQ PO TBCR: 40.0000 meq | EXTENDED_RELEASE_TABLET | Freq: Every day | ORAL | Status: DC

## 2021-12-16 MED ORDER — TORSEMIDE 20 MG PO TABS
ORAL_TABLET | ORAL | 3 refills | Status: DC
Start: 2021-12-16 — End: 2021-12-26
  Filled 2021-12-16: qty 100, 33d supply, fill #0

## 2021-12-16 NOTE — Progress Notes (Signed)
Physical Therapy Treatment Patient Details Name: [name] MRN: [id number] DOB: [birth year] Today's Date: 12/16/2021   History of Present Illness 76 yo admitted 6/10 with DOE, edema and CHF exacerbation. PMHx:chronic diastolic CHF, paroxysmal A-fib on Eliquis, tachybradycardia syndrome with PPM, sleep apnea, CAD, HTN, HLD and obesity    PT Comments    Although pt has made strides in supplemental oxygen demand in seated she continues to require 2L O2 via [place] to maintain SpO2 >90%O2 with longer distance ambulation or ambulation with conversation. Pt provided additional education on use of pulse oximeter and monitoring supplemental O2 need with her activity in an attempt to eventually wean off of supplemental O2. Also encouraged IS use and mindful deep purse lip breathing hourly. Pt is looking forward to discharge home today.    Recommendations for follow up therapy are one component of a multi-disciplinary discharge planning process, led by the attending physician.  Recommendations may be updated based on patient status, additional functional criteria and insurance authorization.  Follow Up Recommendations  No PT follow up     Assistance Recommended at Discharge PRN  Patient can return home with the following Help with stairs or ramp for entrance;A little help with bathing/dressing/bathroom;Assistance with cooking/housework   Equipment Recommendations  None recommended by PT       Precautions / Restrictions Precautions Precautions: Other (comment) Precaution Comments: watch sats Restrictions Weight Bearing Restrictions: No     Mobility  Bed Mobility Overal bed mobility: Modified Independent                  Transfers Overall transfer level: Modified independent                      Ambulation/Gait Ambulation/Gait assistance: Supervision Gait Distance (Feet): 500 Feet Assistive device: None Gait Pattern/deviations: Step-through pattern, Decreased stride  length Gait velocity: WFL Gait velocity interpretation: >2.62 ft/sec, indicative of community ambulatory   General Gait Details: 1xstanding rest breaks, cues for decreased conversation, and deep breathing       Balance Overall balance assessment: No apparent balance deficits (not formally assessed)                                          Cognition Arousal/Alertness: Awake/alert Behavior During Therapy: WFL for tasks assessed/performed Overall Cognitive Status: Within Functional Limits for tasks assessed                                             General Comments  Education on deep purse lip breathing and IS usage to wean off supplemental O2      Pertinent Vitals/Pain Pain Assessment Pain Assessment: No/denies pain     PT Goals (current goals can now be found in the care plan section) Acute Rehab PT Goals Patient Stated Goal: return home, read PT Goal Formulation: With patient Time For Goal Achievement: 12/26/21 Potential to Achieve Goals: Good Progress towards PT goals: Progressing toward goals    Frequency    Min 2X/week      PT Plan Current plan remains appropriate       AM-PAC PT "6 Clicks" Mobility   Outcome Measure  Help needed turning from your back to your side while in a flat bed without using bedrails?: None  Help needed moving from lying on your back to sitting on the side of a flat bed without using bedrails?: None Help needed moving to and from a bed to a chair (including a wheelchair)?: None Help needed standing up from a chair using your arms (e.g., wheelchair or bedside chair)?: None Help needed to walk in hospital room?: A Little Help needed climbing 3-5 steps with a railing? : A Lot 6 Click Score: 21    End of Session   Activity Tolerance: Patient tolerated treatment well Patient left: in chair;with call bell/phone within reach Nurse Communication: Mobility status PT Visit Diagnosis: Other  abnormalities of gait and mobility (R26.89)     Time: 7741-2878 PT Time Calculation (min) (ACUTE ONLY): 23 min  Charges:  $Gait Training: 8-22 mins $Self Care/Home Management: 8-22                     Zachory Mangual B. Migdalia Dk PT, DPT Acute Rehabilitation Services Please use secure chat or  Call Office [phone]    [place] 12/16/2021, 10:05 AM

## 2021-12-16 NOTE — Progress Notes (Signed)
Heart Failure Stewardship Pharmacist Progress Note   PCP: [name], [name], MD PCP-Cardiologist: Mertie Moores, MD    HPI:  76 yo F with PMH of HTN, HLD, CHF, CAD s/p PCI, afib, tachybrady syndrome s/p PPM, and OSA. Heart catheterization 05/2017 with patent RCA stent and chronic occlusion of a small acute marginal branch.  She was also found to have severe pulmonary hypertension. She presented to the ED on 6/10 with shortness of breath, weight gain of 5 lbs, orthopnea, and LEE. Acute exacerbation thought to be due to dietary source. She was taking her medications as prescribed.    CXR on admission with mild cardiomegaly and mild vascular congestion. An ECHO was done on 6/10 and LVEF stable at 60-65% with normal RV.   Shortness of breath and acute respiratory failure felt consistent with CHF exacerbation and she was diuresed.  Unfortunately following diuresis she was unable to be weaned from O2. Question of ILD. May need RHC to better assess pulmonary hypertension.  CT chest on 6/13 with concerns for multifocal infection or inflammation. PCCM consulted.  Current HF Medications: Diuretic: torsemide 20 mg BID Beta Blocker: metoprolol tartrate 50 mg qAM; 100 mg qPM SGLT2i: Jardiance 25 mg daily  Prior to admission HF Medications: Diuretic: torsemide 40 mg BID; metolazone 2.5 mg every 4 days PRN Beta blocker: metoprolol tartrate 50 mg qAM; 100 mg qPM SGLT2i: Jardiance 25 mg daily  Pertinent Lab Values: Serum creatinine 1.31, BUN 26, Potassium 4.5, Sodium 139, BNP 122.2>>67.3, Magnesium 2.7, A1c 8.0 (08/23/21)   Vital Signs: Weight: 239 lbs (admission weight: 242 lbs) Blood pressure: 110/50s  Heart rate: 50-60s  I/O: -1.8L yesterday; net -10.2L  Medication Assistance / Insurance Benefits Check: Does the patient have prescription insurance?  Yes Type of insurance plan: [employer] Medicare  Does the patient qualify for medication assistance through manufacturers or grants?   Pending Eligible  grants and/or patient assistance programs: pending Medication assistance applications in progress: none  Medication assistance applications approved: none Approved medication assistance renewals will be completed by: pending  Outpatient Pharmacy:  Prior to admission outpatient pharmacy: [facility] order Is the patient willing to use [facility] at discharge? Yes Is the patient willing to transition their outpatient pharmacy to utilize a [facility] outpatient pharmacy?   Pending    Assessment: 1. Acute on chronic diastolic CHF (LVEF 49-70%). NYHA class II symptoms. - Continue torsemide 20 mg BID - Continue metoprolol tartrate 50 mg qAM and 100 mg qPM - Continue Jardiance 25 mg daily - Consider addition of spironolactone 12.5 mg daily at follow up  Plan: 1) Medication changes recommended at this time: - Start spironolactone 12.5 mg daily at follow up  2) Patient assistance: - Patient is in the donut hole - Entresto copay until out of the donut hole $178.60 - Jardiance (refill too soon to provide test claim) - Eliquis (refill too soon to provide test claim) - Spoke with patient and she is not interested in pursuing additional assistance at this time. She stated she just paid for 3 months worth of Eliquis and Jardiance.  3)  Education  - Patient has been educated on current HF medications and potential additions to HF medication regimen - Patient verbalizes understanding that over the next few months, these medication doses may change and more medications may be added to optimize HF regimen - Patient has been educated on basic disease state pathophysiology and goals of therapy   Kerby Nora, PharmD, BCPS Heart Failure Stewardship Pharmacist  Phone [phone]

## 2021-12-16 NOTE — Discharge Summary (Signed)
Physician Discharge Summary   Patient: [name] MRN: [id number] DOB: March 02, 1946  Admit date:     12/10/2021  Discharge date: 12/16/21  Discharge Physician: Barton Dubois   PCP: [name], MD   Recommendations at discharge:  Repeat basic metabolic panel to follow electrolytes renal function. Reassess volume status with further adjustment to diuretic regimen as needed. Continue close monitoring of patient's CBGs with further adjustment to hypoglycemic regimen as required. Make sure patient has follow-up with cardiology and pulmonology as instructed. Reassess desaturation screening and need for oxygen supplementation.  Discharge Diagnoses: Active Problems:   Acute on chronic diastolic congestive heart failure (HCC)   SIRS (systemic inflammatory response syndrome) (HCC)   Hyperkalemia   Paroxysmal atrial fibrillation (HCC)   BRADYCARDIA-TACHYCARDIA SYNDROME   PACEMAKER, PERMANENT   Class 2 obesity   Obstructive sleep apnea   Essential hypertension   CAD S/P percutaneous coronary angioplasty   Anticoagulant long-term use   Acute on chronic diastolic CHF (congestive heart failure) (HCC)   Type 2 diabetes mellitus with hyperlipidemia (HCC)   Pulmonary fibrosis (HCC)   ILD (interstitial lung disease) [facility])  Hospital Course: As per H&P written by Dr. Tamala Julian on 12/10/2021 [name] is a 76 y.o. female with medical history significant of hypertension, hyperlipidemia, CHF, CAD s/p PCI, atrial fibrillation on chronic anticoagulation, tachybradycardia syndrome s/p PM, and OSA presents with complaints of shortness of breath acutely worse yesterday.  She checks her weight daily and notes that it may have been up only 5 pounds.  In addition to this patient reports having some orthopnea and lower extremity swelling.  Patient has been taking her Lasix as prescribed and taking extra dose every fourth day or so.  However, she reported that recently her weight did not go down like it normally  does after taking extra dose of Lasix.  She had eaten some pork loins at [employer] yesterday that she believes did not agree with her stomach, and had 1 episode of nausea and vomiting.  Denies any recent fever, chills, chest pain, cough, wheezing, abdominal pain, diarrhea, constipation, or dysuria.  Normally patient is not on oxygen at baseline, but previously had been borderline in the past 2 qualify for home oxygen.  She has an reports that she has appointment with Dr. Golden Pop PA on 6/19.   Upon admission to the emergency department patient was seen to be afebrile with pulse 57-85, respirations 11-35, blood pressure 118/102 to 142/110, and O2 saturations noted to be as low as 87% with improvement on the BiPAP and O2 saturation currently 96%.  Labs significant for WBC 15, potassium 5.3, BUN 31, creatinine 1.35, glucose 203, AST 73, AST 65, BNP 122.2, and troponin 4.  Chest x-ray noted mild "medically with mild vascular congestion.  Patient has been given furosemide 60 mg IV x1 dose.  TRH called to admit.  Assessment and Plan: Acute hypoxic respiratory failure Acute on chronic diastolic CHF Pulmonary fibrosis on high-resolution CT Presumed component of OSA and OHS. -Questionable severe PAH noted on right heart cath 2018 -2D echo with EF of 60-65%, indeterminate diastolic parameters, RV size and function is normal.  No mentions on pulmonary hypertension on patient's 2D echo this time. -Clinically improving,, she is 7 L negative clinically improving, still requiring 2 L of O2, CT chest from 4/22 concerning for possible pulmonary fibrosis and this was confirmed on high-resolution CT.  Of note patient states that she had RSV in 2020 and briefly followed up with Dr. Chase Caller  at the pulmonology office.   -Discussed with cardiology and suspect that there may be a component of noncardiogenic issues contributing to patient's pulmonary symptoms and hypoxia. -Continue Jardiance; continue Lopressor and adjusted  dose of Demadex. -Patient will also follow-up with cardiology as an outpatient. -2 L oxygen supplementation found to be required at time of discharge. -Continue CPAP nightly.   History of pulmonary hypertension/interstitial lung disease and presumed fibrosis development after COVID infection. -Pulmonary hypertension mentioned on right heart cath 2018, echo this admission without suggestion. -Continue outpatient follow-up with pulmonologist. -Patient ended requiring 2 L nasal cannula supplementation at time of discharge.   Hyperkalemia, corrected with interval hypokalemic episode -Continue to follow potassium trend. -Resume adjusted supplementation dose.   Paroxysmal atrial fibrillation on anticoagulation tachybradycardia syndrome s/p permanent pacemaker -Patient appears to be rate controlled at this time.  Status post multiple ablations and pacemaker implantation.  Last device interrogation noted normal function 10/06/2021.   -Continue Tikosyn and Eliquis -Electrolytes goal is to keep K> 4 and mag> 2 -Continue follow-up with cardiology service as an outpatient.   CAD -Patient with prior PCI to RCA.  Noted to have coronary CTA back in 06/2020 -Continue anticoagulation and statin -Patient denies chest pain. -Continue patient follow-up with cardiology service.   Diabetes mellitus type 2, with hyperlipidemia - Last hemoglobin A1c 8 on 08/23/21. -Continue Jardiance and metformin -Modified carbohydrate diet discussed with patient -Continue close monitoring to patient's CBGs to further adjust hypoglycemia regimen as required.   Essential Hypertension -Stable vital signs -Continue current antihypertensive agents -Patient advised to follow heart healthy diet.   Chronic kidney disease stage IIIb -baseline creatinine 1.1-1.4 -Slight increase in creatinine appreciated after diuresis on 12/14/2021; diuretics transition to oral route and back to baseline today. -Continue to follow renal  function trend as an outpatient -Advised to maintain adequate hydration.   OSA -continue the use of CPAP QHS.   Class II obesity -Body mass index is 38.64 kg/m. -Low calorie diet, portion control and increase physical activity.   GERD -Continue PPI. -Lifestyle changes discussed with patient.   Consultants: Cardiology service; PCCM. Procedures performed: See below for x-ray report; 2D echo. Disposition: Discharged home with arrangement for home oxygen. Diet recommendation: Heart healthy, low calorie diet.  DISCHARGE MEDICATION: Allergies as of 12/16/2021       Reactions   Hydrocodone-guaifenesin Other (See Comments)   Confusion/ memory loss   Adhesive [tape] Rash, Other (See Comments)   Allergic to defibrillation pads.   Pedi-pre Tape Spray [wound Dressing Adhesive] Other (See Comments), Rash   Allergic to defibrillation pads.        Medication List     TAKE these medications    apixaban 5 MG Tabs tablet Commonly known as: Eliquis Take 1 tablet (5 mg total) by mouth 2 (two) times daily.   atorvastatin 40 MG tablet Commonly known as: LIPITOR TAKE 1 TABLET BY MOUTH DAILY   CALCIUM 600+D PO Take 1 tablet by mouth daily.   cholecalciferol 25 MCG (1000 UNIT) tablet Commonly known as: VITAMIN D Take 1,000 Units by mouth at bedtime.   diltiazem 360 MG 24 hr tablet Commonly known as: CARDIZEM LA Take 1 tablet (360 mg total) by mouth daily.   dofetilide 250 MCG capsule Commonly known as: TIKOSYN TAKE ONE CAPSULE BY MOUTH TWICE A DAY   DULoxetine 60 MG capsule Commonly known as: CYMBALTA TAKE 1 CAPSULE BY MOUTH EVERY DAY What changed: how much to take   fluticasone 50 MCG/ACT nasal spray Commonly  known as: FLONASE Place 1 spray into both nostrils daily as needed for allergies.   Jardiance 25 MG Tabs tablet Generic drug: empagliflozin TAKE 1 TABLET BY MOUTH DAILY What changed: how much to take   loratadine 10 MG tablet Commonly known as: CLARITIN TAKE  1 TABLET BY MOUTH EVERY DAY What changed:  when to take this reasons to take this   Lysine 1000 MG Tabs Take 1,000 mg by mouth at bedtime.   metFORMIN 1000 MG tablet Commonly known as: GLUCOPHAGE Take 1 tablet (1,000 mg total) by mouth 2 (two) times daily.   metolazone 2.5 MG tablet Commonly known as: ZAROXOLYN TAKE 1 TABLET BY MOUTH EVERY 4  DAYS AS NEEDED FOR SWELLING  PLEASE KEEP UPCOMING APPOINTMENT IN JAN, 2023 FOR FURTHER REFILLS What changed:  how much to take how to take this when to take this additional instructions   metoprolol tartrate 50 MG tablet Commonly known as: LOPRESSOR Take 3 tablets (150 mg total) by mouth daily. TAKE 50 MG IN AM TAKE 100 MG IN PM What changed:  how much to take when to take this additional instructions   multivitamin with minerals Tabs tablet Take 1 tablet by mouth daily. Centrum   nitroGLYCERIN 0.4 MG SL tablet Commonly known as: NITROSTAT Place 1 tablet (0.4 mg total) under the tongue every 5 (five) minutes x 3 doses as needed for chest pain. PLEASE CALL [phone] AND SCHEDULE AN OVERDUE APPT WITH DR Acie Fredrickson FOR FURTHER REFILLS TO BE GRANTED (2ND ATTEMPT) What changed: additional instructions   NON FORMULARY CPAP at bedtime   nystatin-triamcinolone cream Commonly known as: MYCOLOG II Apply 1 application topically 2 (two) times daily as needed. What changed: reasons to take this   omeprazole 20 MG capsule Commonly known as: PRILOSEC Take 20 mg by mouth 2 (two) times daily.   potassium chloride SA 20 MEQ tablet Commonly known as: KLOR-CON M Take 2 tablets (40 mEq total) by mouth daily. What changed: when to take this   Systane 0.4-0.3 % Soln Generic drug: Polyethyl Glycol-Propyl Glycol Place 1 drop into both eyes at bedtime.   torsemide 20 MG tablet Commonly known as: DEMADEX Take '40mg'$  in am and 20 mg at bedtime. What changed:  how much to take how to take this when to take this additional instructions    valACYclovir 1000 MG tablet Commonly known as: VALTREX TAKE 2 TABLETS BY MOUTH EVERY 12 HOURS AS NEEDED What changed:  how much to take how to take this when to take this reasons to take this additional instructions   vitamin B-12 1000 MCG tablet Commonly known as: CYANOCOBALAMIN Take 1,000 mcg by mouth at bedtime.   vitamin C 1000 MG tablet Take 1,000 mg by mouth at bedtime.               Durable Medical Equipment  (From admission, onward)           Start     Ordered   12/16/21 0716  For home use only DME oxygen  Once       Question Answer Comment  Length of Need 12 Months   Mode or (Route) Nasal cannula   Liters per Minute 2   Frequency Continuous (stationary and portable oxygen unit needed)   Oxygen conserving device Yes   Oxygen delivery system Gas      12/16/21 0715            Follow-up Information     [name], [name], MD. Daphane Shepherd  on 12/26/2021.   Specialties: Internal Medicine, Pediatrics Why: '@2'$ :30pm Contact information: [employer] [place] [address] [phone]         Constance Haw, MD .   Specialty: Cardiology Contact information: [address] [place] [address] [phone]         Nahser, Wonda Cheng, MD .   Specialty: Cardiology Contact information: [place] [address] [phone]         [facility]. Go in 12 day(s).   Specialty: Cardiology Why: Hospital Follow up PLEASE bring current medication list to appointment FREE valet parking, Entrance C, off of [employer] information: [address] [id number] [place] [phone]               Discharge Exam: Filed Weights   12/14/21 0406 12/15/21 0013 12/16/21 0404  Weight: 107.4 kg 108.6 kg 108.7 kg   General exam: Alert, awake, oriented x 3; no chest pain, no nausea, no vomiting, no palpitations.   Reports breathing is much better.  2 L nasal cannula in place. Respiratory system: Positive scattered rhonchi; no frank crackles on exam.  Decreased breath sounds at the bases. Cardiovascular system:Rate controlled, no rubs, no gallops, no JVD on exam. Gastrointestinal system: Abdomen is obese, nondistended, soft and nontender. No organomegaly or masses felt. Normal bowel sounds heard. Central nervous system: Alert and oriented. No focal neurological deficits. Extremities: No cyanosis or clubbing. Skin: No petechiae. Psychiatry: Judgement and insight appear normal. Mood & affect appropriate.    Condition at discharge: Stable and improved.  The results of significant diagnostics from this hospitalization (including imaging, microbiology, ancillary and laboratory) are listed below for reference.   Imaging Studies: CT Chest High Resolution  Result Date: 12/13/2021 CLINICAL DATA:  Dyspnea on exertion EXAM: CT CHEST WITHOUT CONTRAST TECHNIQUE: Multidetector CT imaging of the chest was performed following the standard protocol without intravenous contrast. High resolution imaging of the lungs, as well as inspiratory and expiratory imaging, was performed. RADIATION DOSE REDUCTION: This exam was performed according to the departmental dose-optimization program which includes automated exposure control, adjustment of the mA and/or kV according to patient size and/or use of iterative reconstruction technique. COMPARISON:  10/08/2020 FINDINGS: Cardiovascular: Aortic atherosclerosis. Left chest multi lead pacer defibrillator. Cardiomegaly. Three-vessel coronary artery calcifications and stents. No pericardial effusion. Mediastinum/Nodes: Numerous unchanged prominent mediastinal and hilar lymph nodes. Thyroid gland, trachea, and esophagus demonstrate no significant findings. Lungs/Pleura: New, scattered ground-glass airspace opacities and small consolidations throughout the lungs most conspicuously in the  inferior right upper lobe although seen throughout bilaterally (series 6, image 39, 61, 104). Mild, apical predominant irregular interstitial opacity and septal thickening appears similar to prior examination. Lobular air trapping on expiratory phase imaging, similar to prior examination. No pleural effusion or pneumothorax. Upper Abdomen: No acute abnormality. Coarse, nodular cirrhotic morphology of the liver. Musculoskeletal: No chest wall abnormality. No suspicious osseous lesions identified. IMPRESSION: 1. New, scattered ground-glass airspace opacities and small consolidations throughout the lungs, consistent with multifocal infection or inflammation. 2. Mild, underlying apical predominant irregular interstitial opacity and septal thickening appears similar to prior examination. Mosaic airspace attenuation and lobular air trapping on expiratory phase imaging, similar to prior examination. Constellation of findings again suggests chronic fibrotic hypersensitivity pneumonitis, if characterized by ATS pulmonary fibrosis criteria are in an alternative diagnosis (not UIP) pattern per consensus guidelines: Diagnosis of Idiopathic Pulmonary Fibrosis: An Official ATS/ERS/JRS/ALAT  Clinical Practice Guideline. [place], Iss 5, [phone], Mar 03 2017. 3. Numerous unchanged prominent mediastinal and hilar lymph nodes, likely reactive. 4. Cardiomegaly and coronary artery disease. 5. Coarse, nodular cirrhotic morphology of the liver. Aortic Atherosclerosis (ICD10-I70.0). Electronically Signed   By: Delanna Ahmadi M.D.   On: 12/13/2021 10:50   ECHOCARDIOGRAM COMPLETE  Result Date: 12/10/2021    ECHOCARDIOGRAM REPORT   Patient Name:   NELLINE LIO Date of Exam: 12/10/2021 Medical Rec #:  [id number]    Height:       66.0 in Accession #:    [id number]   Weight:       244.0 lb Date of Birth:  1945-07-07     BSA:          2.176 m Patient Age:    53 years     BP:           135/57 mmHg Patient Gender: F             HR:           86 bpm. Exam Location:  Inpatient Procedure: 2D Echo, Cardiac Doppler and Color Doppler Indications:    CHF  History:        Patient has prior history of Echocardiogram examinations, most                 recent 06/01/2018. CAD, Arrythmias:Atrial Fibrillation; Risk                 Factors:Sleep Apnea, Diabetes and Hypertension.  Sonographer:    Jefferey Pica Referring Phys: [id number] RONDELL A SMITH IMPRESSIONS  1. Left ventricular ejection fraction, by estimation, is 60 to 65%. The left ventricle has normal function. The left ventricle has no regional wall motion abnormalities. Left ventricular diastolic parameters are indeterminate.  2. Right ventricular systolic function is normal. The right ventricular size is normal.  3. The mitral valve is normal in structure. Trivial mitral valve regurgitation. No evidence of mitral stenosis.  4. The aortic valve is tricuspid. Aortic valve regurgitation is not visualized. No aortic stenosis is present.  5. The inferior vena cava is normal in size with greater than 50% respiratory variability, suggesting right atrial pressure of 3 mmHg. FINDINGS  Left Ventricle: Left ventricular ejection fraction, by estimation, is 60 to 65%. The left ventricle has normal function. The left ventricle has no regional wall motion abnormalities. The left ventricular internal cavity size was normal in size. There is  no left ventricular hypertrophy. Left ventricular diastolic parameters are indeterminate. Right Ventricle: The right ventricular size is normal. Right ventricular systolic function is normal. Left Atrium: Left atrial size was normal in size. Right Atrium: Right atrial size was normal in size. Pericardium: There is no evidence of pericardial effusion. Mitral Valve: The mitral valve is normal in structure. Mild mitral annular calcification. Trivial mitral valve regurgitation. No evidence of mitral valve stenosis. Tricuspid Valve: The tricuspid valve is normal in  structure. Tricuspid valve regurgitation is trivial. No evidence of tricuspid stenosis. Aortic Valve: The aortic valve is tricuspid. Aortic valve regurgitation is not visualized. No aortic stenosis is present. Aortic valve peak gradient measures 12.3 mmHg. Pulmonic Valve: The pulmonic valve was not well visualized. Pulmonic valve regurgitation is not visualized. No evidence of pulmonic stenosis. Aorta: The aortic root is normal in size and structure. Venous: The inferior vena cava is normal in size with greater than 50% respiratory variability, suggesting right atrial pressure of 3  mmHg. IAS/Shunts: No atrial level shunt detected by color flow Doppler. Additional Comments: A device lead is visualized.  LEFT VENTRICLE PLAX 2D LVIDd:         5.30 cm LVIDs:         2.70 cm LV PW:         1.10 cm LV IVS:        1.10 cm LVOT diam:     1.80 cm LV SV:         83 LV SV Index:   38 LVOT Area:     2.54 cm  RIGHT VENTRICLE          IVC RV Basal diam:  3.00 cm  IVC diam: 1.30 cm LEFT ATRIUM             Index        RIGHT ATRIUM           Index LA diam:        4.20 cm 1.93 cm/m   RA Area:     17.40 cm LA Vol (A2C):   72.2 ml 33.17 ml/m  RA Volume:   44.30 ml  20.36 ml/m LA Vol (A4C):   62.9 ml 28.90 ml/m LA Biplane Vol: 67.2 ml 30.88 ml/m  AORTIC VALVE                  PULMONIC VALVE AV Area (Vmax): 2.39 cm      PV Vmax:       0.94 m/s AV Vmax:        175.60 cm/s   PV Peak grad:  3.6 mmHg AV Peak Grad:   12.3 mmHg LVOT Vmax:      165.00 cm/s LVOT Vmean:     109.500 cm/s LVOT VTI:       0.327 m  AORTA Ao Root diam: 3.50 cm Ao Asc diam:  3.60 cm  SHUNTS Systemic VTI:  0.33 m Systemic Diam: 1.80 cm Kirk Ruths MD Electronically signed by Kirk Ruths MD Signature Date/Time: 12/10/2021/3:56:22 PM    Final    DG Chest Port 1 View  Result Date: 12/10/2021 CLINICAL DATA:  Shortness of breath. EXAM: PORTABLE CHEST 1 VIEW COMPARISON:  Chest radiograph dated 05/27/2020. FINDINGS: Mild cardiomegaly with mild vascular  congestion. No focal consolidation, pleural effusion, or pneumothorax. Left pectoral pacemaker device. Atherosclerotic calcification of the aortic arch. No acute osseous pathology. IMPRESSION: Mild cardiomegaly with mild vascular congestion. Electronically Signed   By: Anner Crete M.D.   On: 12/10/2021 00:50    Microbiology: Results for orders placed or performed during the hospital encounter of 07/05/20  SARS CORONAVIRUS 2 (TAT 6-24 HRS) Nasopharyngeal Nasopharyngeal Swab     Status: None   Collection Time: 07/05/20  9:50 AM   Specimen: Nasopharyngeal Swab  Result Value Ref Range Status   SARS Coronavirus 2 NEGATIVE NEGATIVE Final    Comment: (NOTE) SARS-CoV-2 target nucleic acids are NOT DETECTED.  The SARS-CoV-2 RNA is generally detectable in upper and lower respiratory specimens during the acute phase of infection. Negative results do not preclude SARS-CoV-2 infection, do not rule out co-infections with other pathogens, and should not be used as the sole basis for treatment or other patient management decisions. Negative results must be combined with clinical observations, patient history, and epidemiological information. The expected result is Negative.  Fact Sheet for Patients: SugarRoll.be  Fact Sheet for Healthcare Providers: https://www.woods-mathews.com/  This test is not yet approved or cleared by the [place] FDA and  has been authorized for detection and/or diagnosis of SARS-CoV-2 by FDA under an Emergency Use Authorization (EUA). This EUA will remain  in effect (meaning this test can be used) for the duration of the COVID-19 declaration under Se ction 564(b)(1) of the Act, 21 U.S.C. section 360bbb-3(b)(1), unless the authorization is terminated or revoked sooner.  Performed at [facility], [place] [address], [place], [place] [address]     Labs: CBC: Recent Labs  Lab 12/10/21 0022 12/11/21 0319  12/14/21 0330 12/15/21 0403 12/16/21 0309  WBC 15.0* 12.5* 10.5 8.4 8.0  HGB 14.4 14.4 14.4 13.4 13.1  HCT 44.7 45.5 45.4 41.3 40.3  MCV 96.8 97.8 98.5 96.3 95.7  PLT 274 262 284 253 546   Basic Metabolic Panel: Recent Labs  Lab 12/12/21 0240 12/13/21 0248 12/14/21 0330 12/15/21 0403 12/16/21 0309  NA 137 138 141 135 139  K 4.2 4.0 5.1 4.3 4.5  CL 99 99 103 101 103  CO2 25 29 32 26 29  GLUCOSE 174* 137* 144* 144* 151*  BUN 22 24* 25* 24* 26*  CREATININE 1.43* 1.25* 1.35* 1.22* 1.31*  CALCIUM 8.7* 8.7* 8.9 8.6* 8.6*  MG 2.4  --   --  2.7*  --    Liver Function Tests: Recent Labs  Lab 12/10/21 0022 12/11/21 0319  AST 73* 28  ALT 65* 47*  ALKPHOS 88 84  BILITOT 1.1 1.1  PROT 6.9 6.9  ALBUMIN 3.6 3.4*   CBG: Recent Labs  Lab 12/15/21 0610 12/15/21 1112 12/15/21 1521 12/15/21 2232 12/16/21 0612  GLUCAP 163* 131* 142* 187* 163*    Discharge time spent: greater than 30 minutes.  Signed: Barton Dubois, MD [facility] 12/16/2021

## 2021-12-16 NOTE — TOC Transition Note (Addendum)
Transition of Care [facility]) - CM/SW Discharge Note   Patient Details  Name: [name] MRN: [id number] Date of Birth: Aug 01, 1945  [facility] CM/SW Contact:  Zenon Mayo, RN Phone Number: 12/16/2021, 8:09 AM   Clinical Narrative:    Patient is for dc today, she has transport.  NCM asked patient about her oxygen she states she does not have any on right now and they will check her again and she is hoping she does not need it. NCM will check back with Staff RN to see if she needs home oxygen.  NCM was informed patient needs home oxygen. She has no preference of DME company.  NCM made referral to [facility] with [employer].     Final next level of care: Home/Self Care Barriers to Discharge: Continued Medical Work up   Patient Goals and CMS Choice Patient states their goals for this hospitalization and ongoing recovery are:: return home   Choice offered to / list presented to : NA  Discharge Placement                       Discharge Plan and Services   Discharge Planning Services: CM Consult              DME Agency: NA       HH Arranged: NA          Social Determinants of Health (SDOH) Interventions Food Insecurity Interventions: Intervention Not Indicated Financial Strain Interventions: Intervention Not Indicated Housing Interventions: Intervention Not Indicated Transportation Interventions: Intervention Not Indicated   Readmission Risk Interventions    12/13/2021    9:47 AM  Readmission Risk Prevention Plan  Transportation Screening Complete  PCP or Specialist Appt within 3-5 Days Complete  HRI or Home Care Consult Complete  Social Work Consult for [place] Planning/Counseling Complete  Palliative Care Screening Not Applicable  Medication Review [employer]) Complete

## 2021-12-16 NOTE — Telephone Encounter (Signed)
Patient is scheduled on 12/29/2021 at 9:30am with Derl Barrow- ILD packet and reminder mailed to address on file. Nothing further needed.

## 2021-12-19 ENCOUNTER — Telehealth: Payer: Self-pay

## 2021-12-19 NOTE — Telephone Encounter (Signed)
Transition Care Management Follow-up Telephone Call Date of discharge and from where: [facility] 12-16-21 Dx: acute on chronic diastolic heart failure  How have you been since you were released from the hospital? Doing ok  Any questions or concerns? No  Items Reviewed: Did the pt receive and understand the discharge instructions provided? Yes  Medications obtained and verified? Yes  Other? No  Any new allergies since your discharge? No  Dietary orders reviewed? yes Do you have support at home? Yes   Home Care and Equipment/Supplies: Were home health services ordered? no If so, what is the name of the agency? na  Has the agency set up a time to come to the patient's home? not applicable Were any new equipment or medical supplies ordered?  Yes: oxygen  What is the name of the medical supply agency? [employer] Were you able to get the supplies/equipment? yes Do you have any questions related to the use of the equipment or supplies? No  Functional Questionnaire: (I = Independent and D = Dependent) ADLs: I  Bathing/Dressing- I  Meal Prep- I  Eating- I  Maintaining continence- I  Transferring/Ambulation- I  Managing Meds- I  Follow up appointments reviewed:  PCP Hospital f/u appt confirmed? Yes  Scheduled to see Dr [name]  on 12-26-21 @ 230pm. [facility] f/u appt confirmed? Yes  Scheduled to see Dr Volanda Napoleon on 12-29-21 @ 930am. Are transportation arrangements needed? No  If their condition worsens, is the pt aware to call PCP or go to the Emergency Dept.? Yes Was the patient provided with contact information for the PCP's office or ED? Yes Was to pt encouraged to call back with questions or concerns? Yes

## 2021-12-25 NOTE — H&P (View-Only) (Signed)
HEART & VASCULAR TRANSITION OF CARE CONSULT NOTE     Referring Physician: Dr. Dyann Kief Primary Care: Dr. [name] Primary Cardiologist: Previously Dr. Acie Fredrickson (last seen 2021) EP: Dr. Curt Bears  HPI: Referred to clinic by Dr. Dyann Kief for heart failure consultation. 76 y.o. female with history of chronic diastolic CHF, CAD s/p BMS RCA in 2004, paroxysmal atrial fibrillation s/p AF ablation X 3 and multiple cardioversions on dofetilide, tachybrady syndrome s/p PPM, OSA, obesity DM II, CKD IIIb.   [place] 2018 to evaluate dyspnea: RA mean 13, PA 76/29 (48), PCWP mean 33, LVEDP 21, Fick CO/CI 8.47/3.87, PVR 3 WU. D/t discrepancy in PCWP and LVEDP, CTA heart recommended to r/o pulmonary vein stenosis given hx pulmonary vein isolation. Cardiac CTA with no evidence of pulmonary vein stenosis but did show atypical left pulmonary vein drainage into left atrium.  She was hospitalized 05/2020 with respiratory failure secondary to RSV and bronchitis.  Required supplemental O2. Cardiology was consulted for A-fib RVR in the setting of infection.  Tikosyn was continued and Lopressor added.  At f/u, she was in atrial flutter.  She underwent a fourth ablation (atrial flutter) and DCCV to SR on 07/07/2020. The patient had seen Dr. Chase Caller after hospitalization given GGO on CT chest (not high res) and concern for ILD. High res CT chest 04/22 with improvement in GGO, air trapping present both lungs.  She was admitted earlier this month with acute hypoxic respiratory failure d/t  a/c CHF and possible component of ILD, and ? Mixed WHO Group II/III PH. Cardiology consulted. She diuresed with IV lasix then transitioned to po Torsemide. Echo EF 60-65%, RV okay, no significant valvular disease. HRCT did show ground glass opacities and air trapping also noted on prior scans. Pulmonary consulted. Some findings on imaging appeared suggestive of chronic hypersensitivity pneumonitis. Outpatient follow-up with pulmonary recommended at  discharge. Discharged with supplemental O2, 2L.   She is here today for hospital follow-up.  Continues on 2L O2 [place]. Reports that O2 sats drop to mid 80s when ambulating around the house. Easily winded with little exertion but feels she has been doing better since discharge. No longer having orthopnea, PND or LE edema. Weight has been stable around 235 lb the last few days. Reports that she took metolazone on 06/20 when she gained 1.5 lb. Reports she has been using PRN metolazone for a while. She is very diligent about watching fluid and sodium intake.    Review of Systems: [y] = yes, '[ ]'  = no   General: Weight gain [Y]; Weight loss '[ ]' ; Anorexia '[ ]' ; Fatigue [Y]; Fever '[ ]' ; Chills '[ ]' ; Weakness '[ ]'   Cardiac: Chest pain/pressure '[ ]' ; Resting SOB '[ ]' ; Exertional SOB [Y]; Orthopnea '[ ]' ; Pedal Edema '[ ]' ; Palpitations '[ ]' ; Syncope '[ ]' ; Presyncope '[ ]' ; Paroxysmal nocturnal dyspnea'[ ]'   Pulmonary: Cough '[ ]' ; Wheezing'[ ]' ; Hemoptysis'[ ]' ; Sputum '[ ]' ; Snoring '[ ]'   GI: Vomiting'[ ]' ; Dysphagia'[ ]' ; Melena'[ ]' ; Hematochezia '[ ]' ; Heartburn'[ ]' ; Abdominal pain '[ ]' ; Constipation '[ ]' ; Diarrhea '[ ]' ; BRBPR '[ ]'   GU: Hematuria'[ ]' ; Dysuria '[ ]' ; Nocturia'[ ]'   Vascular: Pain in legs with walking '[ ]' ; Pain in feet with lying flat '[ ]' ; Non-healing sores '[ ]' ; Stroke '[ ]' ; TIA '[ ]' ; Slurred speech '[ ]' ;  Neuro: Headaches'[ ]' ; Vertigo'[ ]' ; Seizures'[ ]' ; Paresthesias'[ ]' ;Blurred vision '[ ]' ; Diplopia '[ ]' ; Vision changes '[ ]'   Ortho/Skin: Arthritis '[ ]' ; Joint pain '[ ]' ; Muscle pain '[ ]' ;  Joint swelling '[ ]' ; Back Pain '[ ]' ; Rash '[ ]'   Psych: Depression'[ ]' ; Anxiety'[ ]'   Heme: Bleeding problems '[ ]' ; Clotting disorders '[ ]' ; Anemia '[ ]'   Endocrine: Diabetes [Y]; Thyroid dysfunction'[ ]'    Past Medical History:  Diagnosis Date   Anticoagulant long-term use    pradaxa   Anxiety    Arthritis    "fingers, lower back" (04/23/2017    CAD (coronary artery disease) 0300,9233   post PTCA with bare-metal stenting to mid RCA in December 2004      CHF  (congestive heart failure) (HCC)    Chronic atrial fibrillation ([place]) 06/2007   Tachybradycardia pacemaker   Chronic kidney disease    10% function - ?R, other kidney is compensating     CVA (cerebral vascular accident) ([place]) 0076,2263   denies residual on 04/23/2017   Depression    Diplopia 06/19/2008   Qualifier: Diagnosis of  By: [name] MD, [name]    Dysrhythmia    ATRIAL FIBRILATION   Edema of lower extremity    Hyperlipidemia    Hypertension    Inferior myocardial infarction [facility])    acute inferior wall mi/other medical hx   Myocardial infarction [facility]) [phone]   Obesity    OSA on CPAP    last test- 2010   Pacemaker    Pneumonia 2014   tx. ----  [facility]   Pulmonary hypertension ([place])    moderate pulmonary hypertension by 10/2016 echo and 10/2013 cardiac cath   Shortness of breath    Skin cancer    "cut off right [place]; burned off LLE" (04/23/2017)   Sleep apnea    Spondylolisthesis    TIA (transient ischemic attack) 2008   Unspecified hemorrhoids without mention of complication 6/38/9373   Colonoscopy--Dr. Carlean Purl     Current Outpatient Medications  Medication Sig Dispense Refill   apixaban (ELIQUIS) 5 MG TABS tablet Take 1 tablet (5 mg total) by mouth 2 (two) times daily. 180 tablet 1   Ascorbic Acid (VITAMIN C) 1000 MG tablet Take 1,000 mg by mouth at bedtime.      atorvastatin (LIPITOR) 40 MG tablet TAKE 1 TABLET BY MOUTH DAILY (Patient taking differently: Take 40 mg by mouth daily.) 100 tablet 2   Calcium Carbonate-Vitamin D (CALCIUM 600+D PO) Take 1 tablet by mouth daily.      cholecalciferol (VITAMIN D) 25 MCG (1000 UNIT) tablet Take 1,000 Units by mouth at bedtime.     diltiazem (CARDIZEM LA) 360 MG 24 hr tablet Take 1 tablet (360 mg total) by mouth daily. 90 tablet 1   dofetilide (TIKOSYN) 250 MCG capsule TAKE ONE CAPSULE BY MOUTH TWICE A DAY (Patient taking differently: Take 250 mcg by mouth 2 (two) times daily.) 180 capsule 3   DULoxetine  (CYMBALTA) 60 MG capsule TAKE 1 CAPSULE BY MOUTH EVERY DAY (Patient taking differently: Take 60 mg by mouth daily.) 90 capsule 0   fluticasone (FLONASE) 50 MCG/ACT nasal spray Place 1 spray into both nostrils daily as needed for allergies.     JARDIANCE 25 MG TABS tablet TAKE 1 TABLET BY MOUTH DAILY (Patient taking differently: Take 25 mg by mouth daily.) 30 tablet 11   loratadine (CLARITIN) 10 MG tablet TAKE 1 TABLET BY MOUTH EVERY DAY (Patient taking differently: Take 10 mg by mouth daily as needed for allergies.) 90 tablet 0   Lysine 1000 MG TABS Take 1,000 mg by mouth at bedtime.      metFORMIN (GLUCOPHAGE) 1000 MG tablet Take  1 tablet (1,000 mg total) by mouth 2 (two) times daily. 180 tablet 1   metolazone (ZAROXOLYN) 2.5 MG tablet TAKE 1 TABLET BY MOUTH EVERY 4  DAYS AS NEEDED FOR SWELLING  PLEASE KEEP UPCOMING APPOINTMENT IN JAN, 2023 FOR FURTHER REFILLS 25 tablet 2   metoprolol tartrate (LOPRESSOR) 50 MG tablet Take 50 mg by mouth 2 (two) times daily. 50 mg in the AM and 100 mg in the PM     Multiple Vitamin (MULTIVITAMIN WITH MINERALS) TABS tablet Take 1 tablet by mouth daily. Centrum     NON FORMULARY CPAP at bedtime     nystatin-triamcinolone (MYCOLOG II) cream Apply 1 application topically 2 (two) times daily as needed. (Patient taking differently: Apply 1 application  topically 2 (two) times daily as needed (rash).) 30 g 1   omeprazole (PRILOSEC) 20 MG capsule Take 20 mg by mouth 2 (two) times daily.     Polyethyl Glycol-Propyl Glycol (SYSTANE) 0.4-0.3 % SOLN Place 1 drop into both eyes at bedtime.     potassium chloride SA (KLOR-CON M) 20 MEQ tablet Take 2 tablets (40 mEq total) by mouth daily.     spironolactone (ALDACTONE) 25 MG tablet Take 0.5 tablets (12.5 mg total) by mouth daily. 45 tablet 3   torsemide (DEMADEX) 20 MG tablet Take 2 tablets (30m) by mouth in the morning and 1 tablet (20 mg) at bedtime. 360 tablet 3   valACYclovir (VALTREX) 1000 MG tablet TAKE 2 TABLETS BY MOUTH  EVERY 12 HOURS AS NEEDED (Patient taking differently: Take 2,000 mg by mouth 2 (two) times daily as needed (fever blisters).) 34 tablet 2   vitamin B-12 (CYANOCOBALAMIN) 1000 MCG tablet Take 1,000 mcg by mouth at bedtime.     nitroGLYCERIN (NITROSTAT) 0.4 MG SL tablet Place 1 tablet (0.4 mg total) under the tongue every 5 (five) minutes x 3 doses as needed for chest pain. PLEASE CALL [phone] SCHEDULE AN OVERDUE APPT WITH DR NAcie FredricksonFOR FURTHER REFILLS TO BE GRANTED (2ND ATTEMPT) (Patient not taking: Reported on 12/26/2021) 15 tablet 0   No current facility-administered medications for this encounter.    Allergies  Allergen Reactions   Hydrocodone-Guaifenesin Other (See Comments)    Confusion/ memory loss   Adhesive [Tape] Rash and Other (See Comments)    Allergic to defibrillation pads.   Pedi-Pre Tape Spray [Wound Dressing Adhesive] Other (See Comments) and Rash    Allergic to defibrillation pads.      Social History   Socioeconomic History   Marital status: Married    Spouse name: DDiplomatic Services operational officer  Number of children: 0   Years of education: HS   Highest education level: High school graduate  Occupational History   Occupation: retired    Comment: previously worked [employer] Tobacco Use   Smoking status: Former    Packs/day: 1.00    Years: 5.00    Total pack years: 5.00    Types: Cigarettes    Start date: 05/11/1978    Quit date: 07/03/1982    Years since quitting: 39.5   Smokeless tobacco: Never  Vaping Use   Vaping Use: Never used  Substance and Sexual Activity   Alcohol use: Yes   Drug use: No   Sexual activity: Not Currently  Other Topics Concern   Not on file  Social History Narrative   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   Married    [place]  Former smoker Pension scheme manager dogs 7    Retired from [place] 2    [place] Strain: [place]  (12/13/2021)   Overall Financial Resource Strain (CARDIA)    Difficulty of Paying Living Expenses: Not very hard  Food Insecurity: No Food Insecurity (12/13/2021)   Hunger Vital Sign    Worried About Running Out of Food in the Last Year: Never true    [place] in the Last Year: Never true  Transportation Needs: No Transportation Needs (12/13/2021)   PRAPARE - Hydrologist (Medical): No    Lack of Transportation (Non-Medical): No  Physical Activity: Unknown (11/30/2020)   Exercise Vital Sign    Days of Exercise per Week: Not on file    Minutes of Exercise per Session: 20 min  Stress: Stress Concern Present (11/30/2020)   [place]    Feeling of Stress : To some extent  Social Connections: Socially Integrated (11/30/2020)   Social Connection and Isolation Panel [NHANES]    Frequency of Communication with Friends and Family: More than three times a week    Frequency of Social Gatherings with Friends and Family: More than three times a week    Attends Religious Services: 1 to 4 times per year    Active Member of [employer] or Organizations: Yes    Attends Archivist Meetings: 1 to 4 times per year    Marital Status: Married  Human resources officer Violence: Not At Risk (11/30/2020)   Humiliation, Afraid, Rape, and Kick questionnaire    Fear of Current or Ex-Partner: No    Emotionally Abused: No    Physically Abused: No    Sexually Abused: No      Family History  Problem Relation Age of Onset   Suicidality Father        suicide death pt was 3 yrs, 1950   Arrhythmia Mother    Hypertension Mother    Diabetes Mother    Dementia Mother    Heart attack Brother    Heart disease Paternal Aunt    Prostate cancer Maternal Grandfather    Diabetes Paternal Grandfather        fathers side of the family   Colon cancer Maternal Aunt     Vitals:    12/26/21 0903  BP: 116/68  Pulse: 84  SpO2: 91%  Weight: 108.4 kg (238 lb 14.4 oz)    PHYSICAL EXAM: General:  Well appearing elderly female. Arrived in wheelchair.  HEENT: normal Neck: supple. no JVD. Carotids 2+ bilat; no bruits.  Cor: PMI nondisplaced. Regular rate & rhythm. No rubs, gallops or murmurs. Lungs: diminished throughout. On supplemental O2. Abdomen: obese, soft, nontender, nondistended.  Extremities: no cyanosis, clubbing, rash, edema Neuro: alert & oriented x 3, cranial nerves grossly intact. moves all 4 extremities w/o difficulty. Affect pleasant.   ASSESSMENT & PLAN: Chronic diastolic CHF -Echo 27/7412: EF 55-60%, RV okay, PASP 53 mmHg -RHC 11/18: RA mean 13, PA 76/29 (48), PCWP mean 33, LVEDP 21, Fick CO/CI 8.47/3.87, PVR 3 WU -Echo 11/21: EF 60-65%, RV okay, TR signal not adequate to assess PA pressure -Echo 06/23: EF 60-65%, RV okay, RVSP not estimated -NYHA III. Volume looks okay today. Continue Torsemide 40 mg q am and 20 mg q pm. Decrease potassium chloride to 20 mEq daily with addition of MRA.  She takes 2.5 mg metolazone PRN for weight gain or edema. - Continue jardiance 25 mg daily (has DM II) - Continue metoprolol tartrate 50 BID - Add spiro 12.5 mg daily - Consider ARNi or ARB next.  -Compliant with sodium and fluid restriction. Discussed in detail today. -Adherent with CPAP but reports she has not had a chip download in years. ? Element OHS. -CMET and BNP today, BMET in 1 week  2. Pulmonary hypertension -Prior RHC in 2018 as above -No pulmonary vein stenosis on cardiac CTA 07/2017 -Cardiac CTA in 12/21 with atypical left pulmonary vein drainage into left atrium (reported as normal on CTA 07/2017) -Echo 06/23: EF 60-65%, RV okay, RVSP not estimated -? Underlying ILD. Followed by Pulmonary. ? Chronic hypersensitivity pneumonitis based on HRCT. - Serologies obtained during recent admit. ANA Negative (previously positive), ESR 60 and CCP 44 (146 a  year ago) - Discussed all of the above and reviewed prior RHC with Dr. Aundra Dubin. Will repeat RHC.  3. Chronic respiratory failure with hypoxia - Suspect multifactorial, combination of chronic diastolic CHF, underlying pulmonary disease, OSA/? OHS - Has been on 2L O2 since discharge, reports desaturations with ambulation.  - Keep O2 sats > 92% - Has f/u with Pulmonary   4. Coronary artery disease - S/p BMS RCA in 2004 - [place] 2018: patent stent RCA, CTO small acute marginal branch with collaterals, LAD and Lcx patent - Denies CP - On atorvastatin 40 daily - No aspirin with anticoagulation  5. Atrial fibrillation/flutter - Hx Afib ablation X 3 and Aflutter ablation in 2022 - SR during recent admit - Regular rhythm on exam today - On Tikosyn 250 mcg BID, Metoprolol tartrate 50 BID and diltiazem 360 mg daily - Anticoagulated with Eliquis - Followed by EP  6. Tachybrady syndrome - s/p PPM  6. OSA - On CPAP - No download or titration in multiple years - Recommended f/u  7. Possible liver cirrhosis - Nodularity of liver noted on CT abdomen pelvis 02/23 and HR CT 06/23 - Has f/u with PCP scheduled. Consider GI referral.    NYHA III GDMT  Diuretic-Torsemide 40 q am, 20 q pm. PRN metolazone BB-Metoprolol tartrate 50 BID Ace/ARB/ARNI-Consider next MRA-Adding spiro 12.5 mg daily SGLT2i-Jardiance 25 mg daily    Referred to HFSW (PCP, Medications, Transportation, ETOH Abuse, Drug Abuse, Insurance, Museum/gallery curator ): No Refer to Pharmacy: No Refer to Home Health: No Refer to Advanced Heart Failure Clinic: Yes, for RHC, f/u depending results Refer to General Cardiology: No, already established with Dr. Acie Fredrickson  Follow up: Advanced Heart Failure pending RHC results, already established with Dr. Acie Fredrickson

## 2021-12-26 ENCOUNTER — Other Ambulatory Visit: Payer: Self-pay | Admitting: Internal Medicine

## 2021-12-26 ENCOUNTER — Encounter: Payer: Self-pay | Admitting: Internal Medicine

## 2021-12-26 ENCOUNTER — Telehealth (HOSPITAL_COMMUNITY): Payer: Self-pay

## 2021-12-26 ENCOUNTER — Ambulatory Visit (INDEPENDENT_AMBULATORY_CARE_PROVIDER_SITE_OTHER): Payer: Medicare Other | Admitting: Internal Medicine

## 2021-12-26 ENCOUNTER — Other Ambulatory Visit (HOSPITAL_COMMUNITY): Payer: Self-pay

## 2021-12-26 ENCOUNTER — Encounter (HOSPITAL_COMMUNITY): Payer: Self-pay

## 2021-12-26 ENCOUNTER — Ambulatory Visit (HOSPITAL_COMMUNITY)
Admit: 2021-12-26 | Discharge: 2021-12-26 | Disposition: A | Discharge status: Home / Self Care | Payer: Medicare Other | Source: Ambulatory Visit | Attending: Physician Assistant | Admitting: Physician Assistant

## 2021-12-26 VITALS — BP 100/52 | Temp 98.8°F | Ht 66.0 in | Wt 239.8 lb

## 2021-12-26 VITALS — BP 116/68 | HR 84 | Wt 238.9 lb

## 2021-12-26 DIAGNOSIS — Z7984 Long term (current) use of oral hypoglycemic drugs: Secondary | ICD-10-CM | POA: Insufficient documentation

## 2021-12-26 DIAGNOSIS — G4733 Obstructive sleep apnea (adult) (pediatric): Secondary | ICD-10-CM | POA: Diagnosis not present

## 2021-12-26 DIAGNOSIS — Z9989 Dependence on other enabling machines and devices: Secondary | ICD-10-CM | POA: Insufficient documentation

## 2021-12-26 DIAGNOSIS — Z9981 Dependence on supplemental oxygen: Secondary | ICD-10-CM | POA: Diagnosis not present

## 2021-12-26 DIAGNOSIS — I482 Chronic atrial fibrillation, unspecified: Secondary | ICD-10-CM | POA: Insufficient documentation

## 2021-12-26 DIAGNOSIS — E1165 Type 2 diabetes mellitus with hyperglycemia: Secondary | ICD-10-CM | POA: Diagnosis not present

## 2021-12-26 DIAGNOSIS — Z789 Other specified health status: Secondary | ICD-10-CM | POA: Diagnosis not present

## 2021-12-26 DIAGNOSIS — I4892 Unspecified atrial flutter: Secondary | ICD-10-CM | POA: Insufficient documentation

## 2021-12-26 DIAGNOSIS — R1013 Epigastric pain: Secondary | ICD-10-CM | POA: Diagnosis not present

## 2021-12-26 DIAGNOSIS — Z950 Presence of cardiac pacemaker: Secondary | ICD-10-CM | POA: Insufficient documentation

## 2021-12-26 DIAGNOSIS — J849 Interstitial pulmonary disease, unspecified: Secondary | ICD-10-CM | POA: Diagnosis not present

## 2021-12-26 DIAGNOSIS — R110 Nausea: Secondary | ICD-10-CM | POA: Diagnosis not present

## 2021-12-26 DIAGNOSIS — Z79899 Other long term (current) drug therapy: Secondary | ICD-10-CM | POA: Insufficient documentation

## 2021-12-26 DIAGNOSIS — Z955 Presence of coronary angioplasty implant and graft: Secondary | ICD-10-CM | POA: Diagnosis not present

## 2021-12-26 DIAGNOSIS — I5032 Chronic diastolic (congestive) heart failure: Secondary | ICD-10-CM | POA: Insufficient documentation

## 2021-12-26 DIAGNOSIS — Z7901 Long term (current) use of anticoagulants: Secondary | ICD-10-CM

## 2021-12-26 DIAGNOSIS — E669 Obesity, unspecified: Secondary | ICD-10-CM | POA: Insufficient documentation

## 2021-12-26 DIAGNOSIS — I495 Sick sinus syndrome: Secondary | ICD-10-CM | POA: Diagnosis not present

## 2021-12-26 DIAGNOSIS — I10 Essential (primary) hypertension: Secondary | ICD-10-CM

## 2021-12-26 DIAGNOSIS — J9611 Chronic respiratory failure with hypoxia: Secondary | ICD-10-CM

## 2021-12-26 DIAGNOSIS — I2510 Atherosclerotic heart disease of native coronary artery without angina pectoris: Secondary | ICD-10-CM

## 2021-12-26 DIAGNOSIS — N1832 Chronic kidney disease, stage 3b: Secondary | ICD-10-CM | POA: Diagnosis not present

## 2021-12-26 DIAGNOSIS — I480 Paroxysmal atrial fibrillation: Secondary | ICD-10-CM | POA: Diagnosis not present

## 2021-12-26 DIAGNOSIS — I130 Hypertensive heart and chronic kidney disease with heart failure and stage 1 through stage 4 chronic kidney disease, or unspecified chronic kidney disease: Secondary | ICD-10-CM | POA: Diagnosis not present

## 2021-12-26 DIAGNOSIS — E1122 Type 2 diabetes mellitus with diabetic chronic kidney disease: Secondary | ICD-10-CM | POA: Insufficient documentation

## 2021-12-26 DIAGNOSIS — I272 Pulmonary hypertension, unspecified: Secondary | ICD-10-CM | POA: Insufficient documentation

## 2021-12-26 DIAGNOSIS — Z9861 Coronary angioplasty status: Secondary | ICD-10-CM

## 2021-12-26 DIAGNOSIS — Z09 Encounter for follow-up examination after completed treatment for conditions other than malignant neoplasm: Secondary | ICD-10-CM

## 2021-12-26 LAB — COMPREHENSIVE METABOLIC PANEL
ALT: 46 U/L — ABNORMAL HIGH (ref 0–44)
AST: 36 U/L (ref 15–41)
Albumin: 3.9 g/dL (ref 3.5–5.0)
Alkaline Phosphatase: 83 U/L (ref 38–126)
Anion gap: 13 (ref 5–15)
BUN: 40 mg/dL — ABNORMAL HIGH (ref 8–23)
CO2: 33 mmol/L — ABNORMAL HIGH (ref 22–32)
Calcium: 10.9 mg/dL — ABNORMAL HIGH (ref 8.9–10.3)
Chloride: 91 mmol/L — ABNORMAL LOW (ref 98–111)
Creatinine, Ser: 1.58 mg/dL — ABNORMAL HIGH (ref 0.44–1.00)
GFR, Estimated: 34 mL/min — ABNORMAL LOW (ref >60)
Glucose, Bld: 212 mg/dL — ABNORMAL HIGH (ref 70–99)
Potassium: 4 mmol/L (ref 3.5–5.1)
Sodium: 137 mmol/L (ref 135–145)
Total Bilirubin: 0.7 mg/dL (ref 0.3–1.2)
Total Protein: 7.5 g/dL (ref 6.5–8.1)

## 2021-12-26 LAB — BRAIN NATRIURETIC PEPTIDE: B Natriuretic Peptide: 93.2 pg/mL (ref 0.0–100.0)

## 2021-12-26 LAB — POCT GLYCOSYLATED HEMOGLOBIN (HGB A1C): Hemoglobin A1C: 7.7 % — AB (ref 4.0–5.6)

## 2021-12-26 MED ORDER — SODIUM CHLORIDE 0.9% FLUSH: 3.0000 mL | Freq: Two times a day (BID) | INTRAVENOUS | Status: DC

## 2021-12-26 MED ORDER — FREESTYLE LIBRE 14 DAY READER DEVI: 3 refills | Status: DC

## 2021-12-26 MED ORDER — POTASSIUM CHLORIDE CRYS ER 20 MEQ PO TBCR: 20.0000 meq | EXTENDED_RELEASE_TABLET | Freq: Every day | ORAL | Status: DC

## 2021-12-26 MED ORDER — FREESTYLE LIBRE 14 DAY SENSOR MISC: 3 refills | Status: DC

## 2021-12-26 MED ORDER — TORSEMIDE 20 MG PO TABS: 40.0000 mg | ORAL_TABLET | Freq: Every day | ORAL | 3 refills | Status: DC

## 2021-12-26 MED ORDER — SPIRONOLACTONE 25 MG PO TABS: 12.5000 mg | ORAL_TABLET | Freq: Every day | ORAL | 3 refills | Status: DC

## 2021-12-26 NOTE — Progress Notes (Signed)
Chief Complaint  Patient presents with   Hospitalization Follow-up    HPI: [name] 76 y.o. come in for hospitalization 6/10 to 12/16/2021 for acute respiratory failure diet acute heart failure and here for follow-up of the medical problems more specifically diabetes management and of note had hypercalcemia of 10.8 on laboratory test done in the heart failure clinic this morning. She was discharged on 2 L of O2 appears to be stable is plan for heart cath next week.  She is to see the pulmonary team later this week.  Currently on metformin at 1000 mg twice daily and Jardiance.  In the hospital she was on insulin per protocol.  States she was 150 blood sugar yesterday but did go up to 200 recently.  Would be interested and GLP 2 medicines however Over the last 3 to 4 weeks she is having more GI symptoms she would call nausea queasiness but did have a vomiting episode once this happens mostly in the morning feels like food perhaps gets stuck in her stomach no change in bowel habits otherwise.  In the past when omeprazole was increased to twice daily symptoms improved she may not have been taking her second dose of omeprazole most recently but plans on doing this.  Her diuretic medication have been changed she had IV diuresis in hospital and her torsemide was decreased to 2 x 20 mg in the morning.  She is also on a fluid restriction.  Having some difficulty trying decide what to eat since she is not allowed to shop as her husband not letting her shop yet to pick out foods.  She is monitoring her blood sugar by CBG.  Multiple sticks while in hospital.  She missed her meeting with Maddie our pharmacist because she was hospitalized. ROS: See pertinent positives and negatives per HPI.  Past Medical History:  Diagnosis Date   Anticoagulant long-term use    pradaxa   Anxiety    Arthritis    "fingers, lower back" (04/23/2017    CAD (coronary artery disease) 2956,2130   post PTCA with  bare-metal stenting to mid RCA in December 2004      CHF (congestive heart failure) (HCC)    Chronic atrial fibrillation (HCC) 06/2007   Tachybradycardia pacemaker   Chronic kidney disease    10% function - ?R, other kidney is compensating     CVA (cerebral vascular accident) [facility]) [phone]   denies residual on 04/23/2017   Depression    Diplopia 06/19/2008   Qualifier: Diagnosis of  By: Fabian Sharp MD, Neta Mends    Dysrhythmia    ATRIAL FIBRILATION   Edema of lower extremity    Hyperlipidemia    Hypertension    Inferior myocardial infarction [facility])    acute inferior wall mi/other medical hx   Myocardial infarction [facility]) [phone]   Obesity    OSA on CPAP    last test- 2010   Pacemaker    Pneumonia 2014   tx. ----  [facility]   Pulmonary hypertension (HCC)    moderate pulmonary hypertension by 10/2016 echo and 10/2013 cardiac cath   Shortness of breath    Skin cancer    "cut off right [place]; burned off LLE" (04/23/2017)   Sleep apnea    Spondylolisthesis    TIA (transient ischemic attack) 2008   Unspecified hemorrhoids without mention of complication 07/15/2003   Colonoscopy--Dr. Leone Payor     Family History  Problem Relation Age of Onset   Suicidality Father  suicide death pt was 3 yrs, 62   Arrhythmia Mother    Hypertension Mother    Diabetes Mother    Dementia Mother    Heart attack Brother    Heart disease Paternal Aunt    Prostate cancer Maternal Grandfather    Diabetes Paternal Grandfather        fathers side of the family   Colon cancer Maternal Aunt     Social History   Socioeconomic History   Marital status: Married    Spouse name: Financial risk analyst   Number of children: 0   Years of education: HS   Highest education level: High school graduate  Occupational History   Occupation: retired    Comment: previously worked [employer]  Tobacco Use   Smoking status: Former    Packs/day: 1.00    Years: 5.00    Total pack years: 5.00    Types:  Cigarettes    Start date: 05/11/1978    Quit date: 07/03/1982    Years since quitting: 39.5   Smokeless tobacco: Never  Vaping Use   Vaping Use: Never used  Substance and Sexual Activity   Alcohol use: Yes   Drug use: No   Sexual activity: Not Currently  Other Topics Concern   Not on file  Social History Narrative   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   Married    Originally from [place] of two, high school education   Former smoker 1979-1984 1ppd   Hunting dogs 7    Retired from [employer]   [id number]   [employer]hof 2    Social Determinants of Corporate investment banker Strain: Low Risk  (12/13/2021)   Overall Financial Resource Strain (CARDIA)    Difficulty of Paying Living Expenses: Not very hard  Food Insecurity: No Food Insecurity (12/13/2021)   Hunger Vital Sign    Worried About Running Out of Food in the Last Year: Never true    Ran Out of Food in the Last Year: Never true  Transportation Needs: No Transportation Needs (12/13/2021)   PRAPARE - Administrator, Civil Service (Medical): No    Lack of Transportation (Non-Medical): No  Physical Activity: Unknown (11/30/2020)   Exercise Vital Sign    Days of Exercise per Week: Not on file    Minutes of Exercise per Session: 20 min  Stress: Stress Concern Present (11/30/2020)   [employer] of Occupational Health - Occupational Stress Questionnaire    Feeling of Stress : To some extent  Social Connections: Socially Integrated (11/30/2020)   Social Connection and Isolation Panel [NHANES]    Frequency of Communication with Friends and Family: More than three times a week    Frequency of Social Gatherings with Friends and Family: More than three times a week    Attends Religious Services: 1 to 4 times per year    Active Member of [employer] or Organizations: Yes    Attends Banker Meetings: 1 to 4 times per year    Marital Status: Married    Outpatient Medications  Prior to Visit  Medication Sig Dispense Refill   apixaban (ELIQUIS) 5 MG TABS tablet Take 1 tablet (5 mg total) by mouth 2 (two) times daily. 180 tablet 1   Ascorbic Acid (VITAMIN C) 1000 MG tablet Take 1,000 mg by mouth at bedtime.      atorvastatin (LIPITOR) 40 MG tablet TAKE 1 TABLET BY MOUTH DAILY (Patient taking differently:  Take 40 mg by mouth daily.) 100 tablet 2   Calcium Carbonate-Vitamin D (CALCIUM 600+D PO) Take 1 tablet by mouth daily.      cholecalciferol (VITAMIN D) 25 MCG (1000 UNIT) tablet Take 1,000 Units by mouth at bedtime.     diltiazem (CARDIZEM LA) 360 MG 24 hr tablet Take 1 tablet (360 mg total) by mouth daily. 90 tablet 1   dofetilide (TIKOSYN) 250 MCG capsule TAKE ONE CAPSULE BY MOUTH TWICE A DAY (Patient taking differently: Take 250 mcg by mouth 2 (two) times daily.) 180 capsule 3   DULoxetine (CYMBALTA) 60 MG capsule TAKE 1 CAPSULE BY MOUTH EVERY DAY (Patient taking differently: Take 60 mg by mouth daily.) 90 capsule 0   fluticasone (FLONASE) 50 MCG/ACT nasal spray Place 1 spray into both nostrils daily as needed for allergies.     JARDIANCE 25 MG TABS tablet TAKE 1 TABLET BY MOUTH DAILY (Patient taking differently: Take 25 mg by mouth daily.) 30 tablet 11   loratadine (CLARITIN) 10 MG tablet TAKE 1 TABLET BY MOUTH EVERY DAY (Patient taking differently: Take 10 mg by mouth daily as needed for allergies.) 90 tablet 0   Lysine 1000 MG TABS Take 1,000 mg by mouth at bedtime.      metFORMIN (GLUCOPHAGE) 1000 MG tablet Take 1 tablet (1,000 mg total) by mouth 2 (two) times daily. 180 tablet 1   metolazone (ZAROXOLYN) 2.5 MG tablet TAKE 1 TABLET BY MOUTH EVERY 4  DAYS AS NEEDED FOR SWELLING  PLEASE KEEP UPCOMING APPOINTMENT IN JAN, 2023 FOR FURTHER REFILLS 25 tablet 2   metoprolol tartrate (LOPRESSOR) 50 MG tablet Take 50 mg by mouth 2 (two) times daily. 50 mg in the AM and 100 mg in the PM     Multiple Vitamin (MULTIVITAMIN WITH MINERALS) TABS tablet Take 1 tablet by mouth  daily. Centrum     nitroGLYCERIN (NITROSTAT) 0.4 MG SL tablet Place 1 tablet (0.4 mg total) under the tongue every 5 (five) minutes x 3 doses as needed for chest pain. PLEASE CALL [phone] AND SCHEDULE AN OVERDUE APPT WITH DR Elease Hashimoto FOR FURTHER REFILLS TO BE GRANTED (2ND ATTEMPT) 15 tablet 0   NON FORMULARY CPAP at bedtime     nystatin-triamcinolone (MYCOLOG II) cream Apply 1 application topically 2 (two) times daily as needed. (Patient taking differently: Apply 1 application  topically 2 (two) times daily as needed (rash).) 30 g 1   omeprazole (PRILOSEC) 20 MG capsule Take 20 mg by mouth 2 (two) times daily.     Polyethyl Glycol-Propyl Glycol (SYSTANE) 0.4-0.3 % SOLN Place 1 drop into both eyes at bedtime.     potassium chloride SA (KLOR-CON M) 20 MEQ tablet Take 1 tablet (20 mEq total) by mouth daily.     spironolactone (ALDACTONE) 25 MG tablet Take 0.5 tablets (12.5 mg total) by mouth daily. 45 tablet 3   torsemide (DEMADEX) 20 MG tablet Take 2 tablets (40 mg total) by mouth daily. 360 tablet 3   valACYclovir (VALTREX) 1000 MG tablet TAKE 2 TABLETS BY MOUTH EVERY 12 HOURS AS NEEDED (Patient taking differently: Take 2,000 mg by mouth 2 (two) times daily as needed (fever blisters).) 34 tablet 2   vitamin B-12 (CYANOCOBALAMIN) 1000 MCG tablet Take 1,000 mcg by mouth at bedtime.     No facility-administered medications prior to visit.     EXAM:  BP (!) 100/52 (BP Location: Left Arm, Patient Position: Sitting, Cuff Size: Normal)   Temp 98.8 F (37.1 C)   Ht  5\' 6"  (1.676 m)   Wt 239 lb 12.8 oz (108.8 kg)   SpO2 93%   BMI 38.70 kg/m   Body mass index is 38.7 kg/m.  GENERAL: vitals reviewed and listed above, alert, oriented, appears well hydrated and in no acute distress on O2 normal cognition and speech does not appear dyspneic at rest HEENT: atraumatic, conjunctiva  clear, no obvious abnormalities on inspection of external nose and ears NECK: no obvious masses on inspection palpation   LUNGS: clear to auscultation bilaterally, no wheezes, rales or rhonchi, EXTR no few Velcro sounds CV: HRRR, no clubbing cyanosis or slight edema nl cap refill  MS: moves all extremities without noticeable focal  abnormality PSYCH: pleasant and cooperative, no obvious depression or anxiety Lab Results  Component Value Date   WBC 8.0 12/16/2021   HGB 13.1 12/16/2021   HCT 40.3 12/16/2021   PLT 255 12/16/2021   GLUCOSE 212 (H) 12/26/2021   CHOL 147 08/23/2021   TRIG 206.0 (H) 08/23/2021   HDL 47.20 08/23/2021   LDLDIRECT 76.0 08/23/2021   LDLCALC 68 04/10/2019   ALT 46 (H) 12/26/2021   AST 36 12/26/2021   NA 137 12/26/2021   K 4.0 12/26/2021   CL 91 (L) 12/26/2021   CREATININE 1.58 (H) 12/26/2021   BUN 40 (H) 12/26/2021   CO2 33 (H) 12/26/2021   TSH 4.61 08/23/2021   INR 1.3 (H) 02/17/2020   HGBA1C 7.7 (A) 12/26/2021   MICROALBUR <0.7 01/23/2017   BP Readings from Last 3 Encounters:  12/26/21 (!) 100/52  12/26/21 116/68  12/16/21 (!) 114/59   Hospital record discharge summary reviewed and recent lab work.  ASSESSMENT AND PLAN:  Discussed the following assessment and plan:  Type 2 diabetes mellitus with hyperglycemia, without long-term current use of insulin (HCC) - Plan: POC HgB A1c, Calcium, ionized, Basic Metabolic Panel, PTH, Intact and Calcium  Hypercalcemia - VS calciums have been in the 8 range we will repeat nonfasting on the 28th.  Diuretics have been backed off as it appears they thought she was a bit prerenal. - Plan: Calcium, ionized, Basic Metabolic Panel, PTH, Intact and Calcium, VITAMIN D 25 Hydroxy (Vit-D Deficiency, Fractures)  Hospital discharge follow-up  Medication management - Plan: Calcium, ionized, Basic Metabolic Panel, PTH, Intact and Calcium  Dyspepsia  Queasiness  Essential hypertension  Pulmonary hypertension (HCC)  CAD S/P percutaneous coronary angioplasty  ILD (interstitial lung disease) (HCC)  Chronic diastolic heart failure  (HCC)  Anticoagulant long-term use  Medically complex patient Complex medical disease recent hospitalization for acute heart failure / respiratory failure may be combination of pulmonary cardiac.  Causes.  In the meantime she is diabetic A1c is acceptable although not optimal.  Would want to avoid hypoglycemia. Consideration of low-dose basal insulin would prefer she be on something like Ozempic but it her GI symptoms may preclude use.  She was already on an SGLT2. Glucose monitoring may be helpful to get more information and better control prescription written for freestyle libre hopefully insurance will accept this Will involve [facility] pharmacist Richardean Canal .  If needed we may have to add low-dose basal insulin.  We will repeat her calcium with ionized calcium PTH vitamin D.  Also of note on CT scan from last fall was noted cirrhotic changes but no portal hypertension signs.  Most likely NAFLD as cause .  -Patient advised to return or notify health care team  if  new concerns arise. In interiim   Patient Instructions  Plan lab  not fasting  on Wednesday Stop the calcium for now.  Try the freestyle libre  for now to get  glucose readings.  2 x per day  I will  have Maddie reconnect with you.    Make sure  taking omeprazole twice a day.    A1c is 7.4-6 range.  Plan fu   in 3-4 weeks  otherwise.  Seeing   GI is in future regarding liver  and gi sx.        Neta Mends. Timothey Dahlstrom M.D.

## 2021-12-26 NOTE — Telephone Encounter (Signed)
Spoke with patient regarding lab results and decrease torsemide to 40mg  daily. Pt aware, agreeable, and verbalized understanding.  Messaged PCP regarding elevated calcium level.

## 2021-12-28 ENCOUNTER — Other Ambulatory Visit (INDEPENDENT_AMBULATORY_CARE_PROVIDER_SITE_OTHER): Payer: Medicare Other

## 2021-12-28 DIAGNOSIS — Z79899 Other long term (current) drug therapy: Secondary | ICD-10-CM

## 2021-12-28 DIAGNOSIS — E1165 Type 2 diabetes mellitus with hyperglycemia: Secondary | ICD-10-CM

## 2021-12-28 LAB — BASIC METABOLIC PANEL
BUN: 29 mg/dL — ABNORMAL HIGH (ref 6–23)
CO2: 31 mEq/L (ref 19–32)
Calcium: 10.3 mg/dL (ref 8.4–10.5)
Chloride: 91 mEq/L — ABNORMAL LOW (ref 96–112)
Creatinine, Ser: 1.34 mg/dL — ABNORMAL HIGH (ref 0.40–1.20)
GFR: 38.69 mL/min — ABNORMAL LOW (ref >60.00)
Glucose, Bld: 240 mg/dL — ABNORMAL HIGH (ref 70–99)
Potassium: 4.3 mEq/L (ref 3.5–5.1)
Sodium: 133 mEq/L — ABNORMAL LOW (ref 135–145)

## 2021-12-28 LAB — VITAMIN D 25 HYDROXY (VIT D DEFICIENCY, FRACTURES): VITD: 79.26 ng/mL (ref 30.00–100.00)

## 2021-12-29 ENCOUNTER — Encounter: Payer: Self-pay | Admitting: Primary Care

## 2021-12-29 ENCOUNTER — Telehealth: Payer: Self-pay | Admitting: *Deleted

## 2021-12-29 ENCOUNTER — Ambulatory Visit: Payer: Medicare Other | Admitting: Primary Care

## 2021-12-29 VITALS — BP 110/60 | HR 69 | Temp 98.9°F | Ht 66.5 in | Wt 242.4 lb

## 2021-12-29 DIAGNOSIS — G4733 Obstructive sleep apnea (adult) (pediatric): Secondary | ICD-10-CM

## 2021-12-29 DIAGNOSIS — J849 Interstitial pulmonary disease, unspecified: Secondary | ICD-10-CM

## 2021-12-29 DIAGNOSIS — J9601 Acute respiratory failure with hypoxia: Secondary | ICD-10-CM | POA: Diagnosis not present

## 2021-12-29 DIAGNOSIS — I5033 Acute on chronic diastolic (congestive) heart failure: Secondary | ICD-10-CM

## 2021-12-29 LAB — PTH, INTACT AND CALCIUM
Calcium: 10.5 mg/dL — ABNORMAL HIGH (ref 8.6–10.4)
PTH: 28 pg/mL (ref 16–77)

## 2021-12-29 LAB — CALCIUM, IONIZED: Calcium, Ion: 5.4 mg/dL (ref 4.7–5.5)

## 2021-12-29 NOTE — Telephone Encounter (Signed)
[name] is to call back to let us know if she wants to get her oxygen through [employer] (concentrator) or [employer].  When she calls back, message [name] and let her know.  Thank you.

## 2021-12-29 NOTE — Progress Notes (Signed)
$'@Patient'X$  ID: [name], female    DOB: 1945-07-31, 76 y.o.   MRN: [id number]  Chief Complaint  Patient presents with   Hospitalization Follow-up    SOB with exertion.  Sats still drop on 2L of oxygen, oxygen increased to 3L.    Referring provider: [name], MD  HPI: 76 year old female, former smoker.  Past medical history significant for chronic diastolic heart failure, coronary artery disease, A-fib, pulmonary hypertension, obstructive sleep apnea, respiratory failure with hypoxia, interstitial lung disease, pulmonary fibrosis, nonalcoholic fatty liver, type 2 diabetes.  Patient of Dr. Chase Caller, last seen in office on 09/21/2020.  underlying ILD -HRCT favors "alternative" diagnosis, air trapping and mosaic attenuation favors chronic hypersensitive pneumonitis.  Detailed environmental history obtained, no trigger identified She does not have stigmata of collagen vascular disease  -Would not use empiric steroids in this setting.   History of pulmonary hypertension with anomalous drainage noted on CT PV -Per cardiology, this does not represent PVOD -Echo not suggestive -Does she need right heart cath?  [facility]23[facility] follow[facility] Patient presents today for hospital follow-up.  Patient was admitted from 12/10/2021 - 12/16/2021 for acute hypoxic respiratory failure secondary to acute on chronic diastolic heart failure, pulmonary fibrosis and presumed OSA/OHS.  Patient had questionable severe PAH noted on right heart cath in 2018.  No mention of pulmonary hypertension on patient's 2D echo at this time.  She was diuresed 7 L, still requiring 2 L supplemental oxygen.  CT chest from April 2022 concerning for possible pulmonary fibrosis confirmed on high-resolution CT.  Patient had RSV in 2020 and has seen Dr. Chase Caller outpatient.  She will continue on torsemide, supplemental oxygen and CPAP at night.             Respiratory failure multifactorial due to CHF, pulmonary fibrosis and OSA.   She has abnormal groundglass opacities on HRCT from 6/13.  This is not significantly changed from prior scans over the past couple years.  Felt to clearly resemble pulmonary fibrosis.  No role for steroids or antibiotics at this time.  Recommend repeating serology.  She is feeling better since discharged She has noticed increased swelling in her feet/ankles and hands She is following with HF clinic, seen on Monday  She is on Torsemide '40mg'$  daily  Planning for heart cath in July with cardiology   TESTING: Echo 6/10 > LVEF 60-65%, Normal valves. RV, LV appear normal.  HRCT 6/13 independently reviewed shows scattered groundglass opacities bilateral consistent with multifocal inflammation and unchanged prior appearance of septal thickening and mosaic attenuation with lobular air trapping on expiration.  Interestingly nodular cirrhotic morphology of the liver was noted 06/2020 CT cors/pulmonary vein >> atypical left pulmonary vein drainage into left atrium  Allergies  Allergen Reactions   Hydrocodone-Guaifenesin Other (See Comments)    Confusion/ memory loss   Adhesive [Tape] Rash and Other (See Comments)    Allergic to defibrillation pads.   Pedi-Pre Tape Spray [Wound Dressing Adhesive] Other (See Comments) and Rash    Allergic to defibrillation pads.    Immunization History  Administered Date(s) Administered   Fluad Quad(high Dose 65+) 04/10/2019, 04/20/2021   Influenza Split 04/18/2011, 05/06/2012   Influenza Whole 07/04/2007, 06/02/2009, 03/01/2010   Influenza, High Dose Seasonal PF 04/03/2014, 04/18/2016, 04/05/2017, 05/06/2018   Influenza,inj,Quad PF,6+ Mos 03/07/2013, 04/06/2015   PFIZER(Purple Top)SARS-COV-2 Vaccination 07/22/2019, 08/10/2019, 03/29/2020   PNEUMOCOCCAL CONJUGATE-20 04/22/2021   Pfizer Covid-19 Vaccine Bivalent Booster 35yr & up 04/22/2021   Pneumococcal Conjugate-13 09/24/2013, 04/20/2020  Pneumococcal Polysaccharide-23 07/03/2005, 07/04/2007, 04/18/2011   Td  06/02/2009   Zoster Recombinat (Shingrix) 04/09/2020   Zoster, Live 07/03/2006    Past Medical History:  Diagnosis Date   Anticoagulant long-term use    pradaxa   Anxiety    Arthritis    "fingers, lower back" (04/23/2017    CAD (coronary artery disease) [phone]   post PTCA with bare-metal stenting to mid RCA in December 2004      CHF (congestive heart failure) (HCC)    Chronic atrial fibrillation ([place]) 06/2007   Tachybradycardia pacemaker   Chronic kidney disease    10% function - ?R, other kidney is compensating     CVA (cerebral vascular accident) ([place]) [phone]   denies residual on 04/23/2017   Depression    Diplopia 06/19/2008   Qualifier: Diagnosis of  By: [name] MD, [name]    Dysrhythmia    ATRIAL FIBRILATION   Edema of lower extremity    Hyperlipidemia    Hypertension    Inferior myocardial infarction [facility])    acute inferior wall mi/other medical hx   Myocardial infarction [facility]) [phone]   Obesity    OSA on CPAP    last test- 2010   Pacemaker    Pneumonia 2014   tx. ----  [facility]   Pulmonary hypertension ([place])    moderate pulmonary hypertension by 10/2016 echo and 10/2013 cardiac cath   Shortness of breath    Skin cancer    "cut off right [place]; burned off LLE" (04/23/2017)   Sleep apnea    Spondylolisthesis    TIA (transient ischemic attack) 2008   Unspecified hemorrhoids without mention of complication 2/37/6283   Colonoscopy--Dr. Carlean Purl     Tobacco History: Social History   Tobacco Use  Smoking Status Former   Packs/day: 1.00   Years: 5.00   Total pack years: 5.00   Types: Cigarettes   Start date: 05/11/1978   Quit date: 07/03/1982   Years since quitting: 39.5  Smokeless Tobacco Never   Counseling given: Not Answered   Outpatient Medications Prior to Visit  Medication Sig Dispense Refill   apixaban (ELIQUIS) 5 MG TABS tablet Take 1 tablet (5 mg total) by mouth 2 (two) times daily. 180 tablet 1   Ascorbic Acid (VITAMIN C) 1000  MG tablet Take 1,000 mg by mouth at bedtime.      atorvastatin (LIPITOR) 40 MG tablet TAKE 1 TABLET BY MOUTH DAILY (Patient taking differently: Take 40 mg by mouth daily.) 100 tablet 2   Calcium Carbonate-Vitamin D (CALCIUM 600+D PO) Take 1 tablet by mouth daily.      cholecalciferol (VITAMIN D) 25 MCG (1000 UNIT) tablet Take 1,000 Units by mouth at bedtime.     diltiazem (CARDIZEM LA) 360 MG 24 hr tablet Take 1 tablet (360 mg total) by mouth daily. 90 tablet 1   dofetilide (TIKOSYN) 250 MCG capsule TAKE ONE CAPSULE BY MOUTH TWICE A DAY (Patient taking differently: Take 250 mcg by mouth 2 (two) times daily.) 180 capsule 3   DULoxetine (CYMBALTA) 60 MG capsule TAKE 1 CAPSULE BY MOUTH EVERY DAY (Patient taking differently: Take 60 mg by mouth daily.) 90 capsule 0   fluticasone (FLONASE) 50 MCG/ACT nasal spray Place 1 spray into both nostrils daily as needed for allergies.     JARDIANCE 25 MG TABS tablet TAKE 1 TABLET BY MOUTH DAILY (Patient taking differently: Take 25 mg by mouth daily.) 30 tablet 11   loratadine (CLARITIN) 10 MG tablet TAKE 1 TABLET  BY MOUTH EVERY DAY (Patient taking differently: Take 10 mg by mouth daily as needed for allergies.) 90 tablet 0   Lysine 1000 MG TABS Take 1,000 mg by mouth at bedtime.      metFORMIN (GLUCOPHAGE) 1000 MG tablet Take 1 tablet (1,000 mg total) by mouth 2 (two) times daily. 180 tablet 1   metolazone (ZAROXOLYN) 2.5 MG tablet TAKE 1 TABLET BY MOUTH EVERY 4  DAYS AS NEEDED FOR SWELLING  PLEASE KEEP UPCOMING APPOINTMENT IN JAN, 2023 FOR FURTHER REFILLS 25 tablet 2   metoprolol tartrate (LOPRESSOR) 50 MG tablet Take 50 mg by mouth 2 (two) times daily. 50 mg in the AM and 100 mg in the PM     Multiple Vitamin (MULTIVITAMIN WITH MINERALS) TABS tablet Take 1 tablet by mouth daily. Centrum     nitroGLYCERIN (NITROSTAT) 0.4 MG SL tablet Place 1 tablet (0.4 mg total) under the tongue every 5 (five) minutes x 3 doses as needed for chest pain. PLEASE CALL [phone]  AND SCHEDULE AN OVERDUE APPT WITH DR Acie Fredrickson FOR FURTHER REFILLS TO BE GRANTED (2ND ATTEMPT) 15 tablet 0   NON FORMULARY CPAP at bedtime     nystatin-triamcinolone (MYCOLOG II) cream Apply 1 application topically 2 (two) times daily as needed. (Patient taking differently: Apply 1 application  topically 2 (two) times daily as needed (rash).) 30 g 1   omeprazole (PRILOSEC) 20 MG capsule Take 20 mg by mouth 2 (two) times daily.     Polyethyl Glycol-Propyl Glycol (SYSTANE) 0.4-0.3 % SOLN Place 1 drop into both eyes at bedtime.     potassium chloride SA (KLOR-CON M) 20 MEQ tablet Take 1 tablet (20 mEq total) by mouth daily.     spironolactone (ALDACTONE) 25 MG tablet Take 0.5 tablets (12.5 mg total) by mouth daily. 45 tablet 3   valACYclovir (VALTREX) 1000 MG tablet TAKE 2 TABLETS BY MOUTH EVERY 12 HOURS AS NEEDED (Patient taking differently: Take 2,000 mg by mouth 2 (two) times daily as needed (fever blisters).) 34 tablet 2   vitamin B-12 (CYANOCOBALAMIN) 1000 MCG tablet Take 1,000 mcg by mouth at bedtime.     torsemide (DEMADEX) 20 MG tablet Take 2 tablets (40 mg total) by mouth daily. 360 tablet 3   Continuous Blood Gluc Receiver (FREESTYLE LIBRE 14 DAY READER) DEVI Use   14 days for diabetes control. (Patient not taking: Reported on 12/29/2021) 1 each 3   Continuous Blood Gluc Sensor (FREESTYLE LIBRE 14 DAY SENSOR) MISC Apply for 14 days  for diabetes  monitoring. (Patient not taking: Reported on 12/29/2021) 1 each 3   No facility-administered medications prior to visit.   Review of Systems  Review of Systems  Constitutional: Negative.   HENT: Negative.    Respiratory:  Positive for shortness of breath.   Cardiovascular:  Positive for leg swelling.     Physical Exam  BP 110/60 (BP Location: Right Arm, Patient Position: Sitting, Cuff Size: Large)   Pulse 69   Temp 98.9 F (37.2 C) (Oral)   Ht 5' 6.5" (1.689 m)   Wt 242 lb 6.4 oz (110 kg)   SpO2 (!) 87% Comment: 2L, increased to 3L sats up  to 92%.  BMI 38.54 kg/m  Physical Exam Constitutional:      Appearance: Normal appearance. She is not ill-appearing.  HENT:     Head: Normocephalic and atraumatic.     Mouth/Throat:     Mouth: Mucous membranes are moist.  Cardiovascular:     Rate and Rhythm:  Normal rate.     Comments: Traced - +1 BLE edema  Pulmonary:     Effort: Pulmonary effort is normal.     Breath sounds: Rales present.     Comments: 2-3L O2 Musculoskeletal:        General: Normal range of motion.  Skin:    General: Skin is warm.  Neurological:     General: No focal deficit present.     Mental Status: She is alert. Mental status is at baseline.  Psychiatric:        Judgment: Judgment normal.      Lab Results:  CBC    Component Value Date/Time   WBC 8.0 12/16/2021 0309   RBC 4.21 12/16/2021 0309   HGB 14.3 01/04/2022 0811   HGB 14.6 01/04/2022 0811   HGB 16.4 (H) 10/06/2021 1234   HCT 42.0 01/04/2022 0811   HCT 43.0 01/04/2022 0811   HCT 46.1 10/06/2021 1234   PLT 255 12/16/2021 0309   PLT 340 10/06/2021 1234   MCV 95.7 12/16/2021 0309   MCV 91 10/06/2021 1234   MCH 31.1 12/16/2021 0309   MCHC 32.5 12/16/2021 0309   RDW 13.9 12/16/2021 0309   RDW 12.7 10/06/2021 1234   LYMPHSABS 2.2 08/23/2021 0935   LYMPHSABS 1.9 09/07/2017 1139   MONOABS 0.8 08/23/2021 0935   EOSABS 0.3 08/23/2021 0935   EOSABS 0.3 09/07/2017 1139   BASOSABS 0.1 08/23/2021 0935   BASOSABS 0.0 09/07/2017 1139    BMET    Component Value Date/Time   NA 138 01/04/2022 0811   NA 138 01/04/2022 0811   NA 140 10/19/2021 0916   K 3.8 01/04/2022 0811   K 3.8 01/04/2022 0811   CL 93 (L) 01/04/2022 0659   CO2 26 01/04/2022 0659   GLUCOSE 196 (H) 01/04/2022 0659   BUN 30 (H) 01/04/2022 0659   BUN 31 (H) 10/19/2021 0916   CREATININE 1.53 (H) 01/04/2022 0659   CREATININE 1.52 (H) 06/07/2020 1506   CALCIUM 9.2 01/04/2022 0659   GFRNONAA 35 (L) 01/04/2022 0659   GFRNONAA 33 (L) 06/07/2020 1506   GFRAA 40 (L)  07/28/2020 1044   GFRAA 39 (L) 06/07/2020 1506    BNP    Component Value Date/Time   BNP 93.2 12/26/2021 0951    ProBNP    Component Value Date/Time   PROBNP 523 07/08/2021 1221   PROBNP 1,006.0 (H) 03/21/2014 1945    Imaging: CARDIAC CATHETERIZATION  Result Date: 01/04/2022 1. Elevated right and left heart filling pressures. 2. Moderate primarily pulmonary venous hypertension. 3. Preserved cardiac output. Patient will increase torsemide to 60 mg daily.   CT Chest High Resolution  Result Date: 12/13/2021 CLINICAL DATA:  Dyspnea on exertion EXAM: CT CHEST WITHOUT CONTRAST TECHNIQUE: Multidetector CT imaging of the chest was performed following the standard protocol without intravenous contrast. High resolution imaging of the lungs, as well as inspiratory and expiratory imaging, was performed. RADIATION DOSE REDUCTION: This exam was performed according to the departmental dose-optimization program which includes automated exposure control, adjustment of the mA and/or kV according to patient size and/or use of iterative reconstruction technique. COMPARISON:  10/08/2020 FINDINGS: Cardiovascular: Aortic atherosclerosis. Left chest multi lead pacer defibrillator. Cardiomegaly. Three-vessel coronary artery calcifications and stents. No pericardial effusion. Mediastinum/Nodes: Numerous unchanged prominent mediastinal and hilar lymph nodes. Thyroid gland, trachea, and esophagus demonstrate no significant findings. Lungs/Pleura: New, scattered ground-glass airspace opacities and small consolidations throughout the lungs most conspicuously in the inferior right upper lobe although seen throughout bilaterally (  series 6, image 39, 61, 104). Mild, apical predominant irregular interstitial opacity and septal thickening appears similar to prior examination. Lobular air trapping on expiratory phase imaging, similar to prior examination. No pleural effusion or pneumothorax. Upper Abdomen: No acute abnormality.  Coarse, nodular cirrhotic morphology of the liver. Musculoskeletal: No chest wall abnormality. No suspicious osseous lesions identified. IMPRESSION: 1. New, scattered ground-glass airspace opacities and small consolidations throughout the lungs, consistent with multifocal infection or inflammation. 2. Mild, underlying apical predominant irregular interstitial opacity and septal thickening appears similar to prior examination. Mosaic airspace attenuation and lobular air trapping on expiratory phase imaging, similar to prior examination. Constellation of findings again suggests chronic fibrotic hypersensitivity pneumonitis, if characterized by ATS pulmonary fibrosis criteria are in an alternative diagnosis (not UIP) pattern per consensus guidelines: Diagnosis of Idiopathic Pulmonary Fibrosis: An Official ATS/ERS/JRS/ALAT Clinical Practice Guideline. [place], Iss 5, [phone], Mar 03 2017. 3. Numerous unchanged prominent mediastinal and hilar lymph nodes, likely reactive. 4. Cardiomegaly and coronary artery disease. 5. Coarse, nodular cirrhotic morphology of the liver. Aortic Atherosclerosis (ICD10-I70.0). Electronically Signed   By: Delanna Ahmadi M.D.   On: 12/13/2021 10:50   ECHOCARDIOGRAM COMPLETE  Result Date: 12/10/2021    ECHOCARDIOGRAM REPORT   Patient Name:   [name] Date of Exam: 12/10/2021 Medical Rec #:  [id number]    Height:       66.0 in Accession #:    [id number]   Weight:       244.0 lb Date of Birth:  [birth year]     BSA:          2.176 m Patient Age:    23 years     BP:           135/57 mmHg Patient Gender: F            HR:           86 bpm. Exam Location:  Inpatient Procedure: 2D Echo, Cardiac Doppler and Color Doppler Indications:    CHF  History:        Patient has prior history of Echocardiogram examinations, most                 recent 06/01/2018. CAD, Arrythmias:Atrial Fibrillation; Risk                 Factors:Sleep Apnea, Diabetes and Hypertension.  Sonographer:     Jefferey Pica Referring Phys: [id number] RONDELL A SMITH IMPRESSIONS  1. Left ventricular ejection fraction, by estimation, is 60 to 65%. The left ventricle has normal function. The left ventricle has no regional wall motion abnormalities. Left ventricular diastolic parameters are indeterminate.  2. Right ventricular systolic function is normal. The right ventricular size is normal.  3. The mitral valve is normal in structure. Trivial mitral valve regurgitation. No evidence of mitral stenosis.  4. The aortic valve is tricuspid. Aortic valve regurgitation is not visualized. No aortic stenosis is present.  5. The inferior vena cava is normal in size with greater than 50% respiratory variability, suggesting right atrial pressure of 3 mmHg. FINDINGS  Left Ventricle: Left ventricular ejection fraction, by estimation, is 60 to 65%. The left ventricle has normal function. The left ventricle has no regional wall motion abnormalities. The left ventricular internal cavity size was normal in size. There is  no left ventricular hypertrophy. Left ventricular diastolic parameters are indeterminate. Right Ventricle: The right ventricular size is normal. Right ventricular systolic function is normal. Left  Atrium: Left atrial size was normal in size. Right Atrium: Right atrial size was normal in size. Pericardium: There is no evidence of pericardial effusion. Mitral Valve: The mitral valve is normal in structure. Mild mitral annular calcification. Trivial mitral valve regurgitation. No evidence of mitral valve stenosis. Tricuspid Valve: The tricuspid valve is normal in structure. Tricuspid valve regurgitation is trivial. No evidence of tricuspid stenosis. Aortic Valve: The aortic valve is tricuspid. Aortic valve regurgitation is not visualized. No aortic stenosis is present. Aortic valve peak gradient measures 12.3 mmHg. Pulmonic Valve: The pulmonic valve was not well visualized. Pulmonic valve regurgitation is not visualized. No  evidence of pulmonic stenosis. Aorta: The aortic root is normal in size and structure. Venous: The inferior vena cava is normal in size with greater than 50% respiratory variability, suggesting right atrial pressure of 3 mmHg. IAS/Shunts: No atrial level shunt detected by color flow Doppler. Additional Comments: A device lead is visualized.  LEFT VENTRICLE PLAX 2D LVIDd:         5.30 cm LVIDs:         2.70 cm LV PW:         1.10 cm LV IVS:        1.10 cm LVOT diam:     1.80 cm LV SV:         83 LV SV Index:   38 LVOT Area:     2.54 cm  RIGHT VENTRICLE          IVC RV Basal diam:  3.00 cm  IVC diam: 1.30 cm LEFT ATRIUM             Index        RIGHT ATRIUM           Index LA diam:        4.20 cm 1.93 cm/m   RA Area:     17.40 cm LA Vol (A2C):   72.2 ml 33.17 ml/m  RA Volume:   44.30 ml  20.36 ml/m LA Vol (A4C):   62.9 ml 28.90 ml/m LA Biplane Vol: 67.2 ml 30.88 ml/m  AORTIC VALVE                  PULMONIC VALVE AV Area (Vmax): 2.39 cm      PV Vmax:       0.94 m/s AV Vmax:        175.60 cm/s   PV Peak grad:  3.6 mmHg AV Peak Grad:   12.3 mmHg LVOT Vmax:      165.00 cm/s LVOT Vmean:     109.500 cm/s LVOT VTI:       0.327 m  AORTA Ao Root diam: 3.50 cm Ao Asc diam:  3.60 cm  SHUNTS Systemic VTI:  0.33 m Systemic Diam: 1.80 cm Kirk Ruths MD Electronically signed by Kirk Ruths MD Signature Date/Time: 12/10/2021/3:56:22 PM    Final    DG Chest Port 1 View  Result Date: 12/10/2021 CLINICAL DATA:  Shortness of breath. EXAM: PORTABLE CHEST 1 VIEW COMPARISON:  Chest radiograph dated 05/27/2020. FINDINGS: Mild cardiomegaly with mild vascular congestion. No focal consolidation, pleural effusion, or pneumothorax. Left pectoral pacemaker device. Atherosclerotic calcification of the aortic arch. No acute osseous pathology. IMPRESSION: Mild cardiomegaly with mild vascular congestion. Electronically Signed   By: Anner Crete M.D.   On: 12/10/2021 00:50     Assessment & Plan:   Acute respiratory failure  with hypoxia ([place]) - Admitted 12/10/21-12/16/21 for acute on chronic respiratory  failure secondary to heart failure, pulmonary fibrosis and OSA/OHS. Placing an order for [employer]. Referring to pulmonary rehab   Acute on chronic diastolic CHF (congestive heart failure) ([place]) - Stable; Feeling better since discharge. Noticed some increased swelling. Following with HF clinic, recently seen  - Continue Torsemide 40 mg daily - You can take Zaroxolyn 2.5 mg every 4 days as needed for swelling (I would notify cardiology first before taking)  ILD (interstitial lung disease) ([place]) - Abnormal groundglass opacities on HRCT from 12/13/21.  This has not significantly changed from prior scans over the past couple years.  Felt to clearly resemble pulmonary fibrosis.  No role for steroids or antibiotics at this time.  Recommend repeating serology. Get repeat HRCT in 3 months. Right heart cath to evaluate pulmonary hypertension, potentially may need to be started on vasodilators   Obstructive sleep apnea - Currently well controlled on CPAP   Martyn Ehrich, NP 01/05/2022

## 2021-12-29 NOTE — Patient Instructions (Addendum)
Sleep apnea is currently well controlled on CPAP, no changes to pressure setting recommended  Keep appointment with cardiology for right heart cath to evaluate pulmonary hypertension, potentially may need to be started on vasodilators  Recommendations: - Continue Torsemide 40 mg daily - You can take Zaroxolyn 2.5 mg every 4 days as needed for swelling (I would notify cardiology first before taking) - Continue to wear CPAP every night for minimum 4 to 6 hours or longer - Continue to wear oxygen 24/7  Orders: - Labs today (ordered) - Repeat HRCT in 3 months (ordered) - [employer] portable concentrator   Referral: - Pulmonary rehab re: resp failure   Follow-up: - 3 months with Dr. Chase Caller

## 2021-12-30 LAB — RHEUMATOID FACTOR: Rheumatoid fact SerPl-aCnc: <14 IU/mL (ref <14)

## 2021-12-30 LAB — ANTI-DNA ANTIBODY, DOUBLE-STRANDED: ds DNA Ab: 3 IU/mL

## 2021-12-30 LAB — SJOGRENS SYNDROME-A EXTRACTABLE NUCLEAR ANTIBODY: SSA (Ro) (ENA) Antibody, IgG: <1 AI

## 2021-12-30 LAB — SJOGREN'S SYNDROME ANTIBODS(SSA + SSB)
SSA (Ro) (ENA) Antibody, IgG: <1 AI
SSB (La) (ENA) Antibody, IgG: <1 AI

## 2021-12-30 LAB — ANTI-SCLERODERMA ANTIBODY: Scleroderma (Scl-70) (ENA) Antibody, IgG: <1 AI

## 2021-12-30 NOTE — Telephone Encounter (Signed)
Order updated to reflect that patient would like to use [employer] for all oxygen needs.  Nothing further needed.

## 2021-12-30 NOTE — Telephone Encounter (Signed)
Patient would like to get oxygen thru [employer]. Patient phone number is [phone].

## 2022-01-01 NOTE — Progress Notes (Signed)
Ionized calcium is in normal range  but serum calcium is slightly elevated.  Will follow  Random Blood sugar was elevated.  Will discuss at her fu visit  and after her heart cath on July 5

## 2022-01-02 ENCOUNTER — Telehealth: Payer: Self-pay | Admitting: Pharmacist

## 2022-01-02 NOTE — Telephone Encounter (Signed)
Called patient about getting coverage for a CGM after discussion with PCP. Left voicemail requesting a call back.

## 2022-01-04 ENCOUNTER — Ambulatory Visit (HOSPITAL_COMMUNITY)
Admission: RE | Admit: 2022-01-04 | Discharge: 2022-01-04 | Disposition: A | Discharge status: Home / Self Care | Payer: Medicare Other | Source: Ambulatory Visit | Attending: Cardiology | Admitting: Cardiology

## 2022-01-04 ENCOUNTER — Encounter (HOSPITAL_COMMUNITY)
Admission: RE | Disposition: A | Discharge status: Home / Self Care | Payer: Self-pay | Source: Ambulatory Visit | Attending: Cardiology

## 2022-01-04 DIAGNOSIS — Z87891 Personal history of nicotine dependence: Secondary | ICD-10-CM | POA: Diagnosis not present

## 2022-01-04 DIAGNOSIS — N1832 Chronic kidney disease, stage 3b: Secondary | ICD-10-CM | POA: Insufficient documentation

## 2022-01-04 DIAGNOSIS — J8410 Pulmonary fibrosis, unspecified: Secondary | ICD-10-CM | POA: Diagnosis not present

## 2022-01-04 DIAGNOSIS — I480 Paroxysmal atrial fibrillation: Secondary | ICD-10-CM | POA: Insufficient documentation

## 2022-01-04 DIAGNOSIS — J9611 Chronic respiratory failure with hypoxia: Secondary | ICD-10-CM | POA: Insufficient documentation

## 2022-01-04 DIAGNOSIS — Z7984 Long term (current) use of oral hypoglycemic drugs: Secondary | ICD-10-CM | POA: Diagnosis not present

## 2022-01-04 DIAGNOSIS — Z950 Presence of cardiac pacemaker: Secondary | ICD-10-CM | POA: Insufficient documentation

## 2022-01-04 DIAGNOSIS — I2510 Atherosclerotic heart disease of native coronary artery without angina pectoris: Secondary | ICD-10-CM | POA: Diagnosis not present

## 2022-01-04 DIAGNOSIS — I272 Pulmonary hypertension, unspecified: Secondary | ICD-10-CM | POA: Diagnosis not present

## 2022-01-04 DIAGNOSIS — J849 Interstitial pulmonary disease, unspecified: Secondary | ICD-10-CM | POA: Diagnosis not present

## 2022-01-04 DIAGNOSIS — Z7901 Long term (current) use of anticoagulants: Secondary | ICD-10-CM | POA: Insufficient documentation

## 2022-01-04 DIAGNOSIS — Z79899 Other long term (current) drug therapy: Secondary | ICD-10-CM | POA: Diagnosis not present

## 2022-01-04 DIAGNOSIS — E1122 Type 2 diabetes mellitus with diabetic chronic kidney disease: Secondary | ICD-10-CM | POA: Insufficient documentation

## 2022-01-04 DIAGNOSIS — I495 Sick sinus syndrome: Secondary | ICD-10-CM | POA: Diagnosis not present

## 2022-01-04 DIAGNOSIS — J9601 Acute respiratory failure with hypoxia: Secondary | ICD-10-CM | POA: Diagnosis not present

## 2022-01-04 DIAGNOSIS — I130 Hypertensive heart and chronic kidney disease with heart failure and stage 1 through stage 4 chronic kidney disease, or unspecified chronic kidney disease: Secondary | ICD-10-CM | POA: Diagnosis not present

## 2022-01-04 DIAGNOSIS — I5032 Chronic diastolic (congestive) heart failure: Secondary | ICD-10-CM | POA: Insufficient documentation

## 2022-01-04 DIAGNOSIS — I4892 Unspecified atrial flutter: Secondary | ICD-10-CM | POA: Diagnosis not present

## 2022-01-04 DIAGNOSIS — G4733 Obstructive sleep apnea (adult) (pediatric): Secondary | ICD-10-CM | POA: Diagnosis not present

## 2022-01-04 HISTORY — PX: RIGHT HEART CATH: CATH118263

## 2022-01-04 LAB — POCT I-STAT EG7
Acid-Base Excess: 6 mmol/L — ABNORMAL HIGH (ref 0.0–2.0)
Acid-Base Excess: 6 mmol/L — ABNORMAL HIGH (ref 0.0–2.0)
Bicarbonate: 32.7 mmol/L — ABNORMAL HIGH (ref 20.0–28.0)
Bicarbonate: 32.1 mmol/L — ABNORMAL HIGH (ref 20.0–28.0)
Calcium, Ion: 1.24 mmol/L (ref 1.15–1.40)
Calcium, Ion: 1.23 mmol/L (ref 1.15–1.40)
HCT: 42 % (ref 36.0–46.0)
HCT: 43 % (ref 36.0–46.0)
Hemoglobin: 14.3 g/dL (ref 12.0–15.0)
Hemoglobin: 14.6 g/dL (ref 12.0–15.0)
O2 Saturation: 70 %
O2 Saturation: 72 %
Potassium: 3.8 mmol/L (ref 3.5–5.1)
Potassium: 3.8 mmol/L (ref 3.5–5.1)
Sodium: 138 mmol/L (ref 135–145)
Sodium: 138 mmol/L (ref 135–145)
TCO2: 34 mmol/L — ABNORMAL HIGH (ref 22–32)
TCO2: 34 mmol/L — ABNORMAL HIGH (ref 22–32)
pCO2, Ven: 53.2 mmHg (ref 44–60)
pCO2, Ven: 52.8 mmHg (ref 44–60)
pH, Ven: 7.396 (ref 7.25–7.43)
pH, Ven: 7.391 (ref 7.25–7.43)
pO2, Ven: 38 mmHg (ref 32–45)
pO2, Ven: 39 mmHg (ref 32–45)

## 2022-01-04 LAB — BASIC METABOLIC PANEL
Anion gap: 15 (ref 5–15)
BUN: 30 mg/dL — ABNORMAL HIGH (ref 8–23)
CO2: 26 mmol/L (ref 22–32)
Calcium: 9.2 mg/dL (ref 8.9–10.3)
Chloride: 93 mmol/L — ABNORMAL LOW (ref 98–111)
Creatinine, Ser: 1.53 mg/dL — ABNORMAL HIGH (ref 0.44–1.00)
GFR, Estimated: 35 mL/min — ABNORMAL LOW (ref >60)
Glucose, Bld: 196 mg/dL — ABNORMAL HIGH (ref 70–99)
Potassium: 4.6 mmol/L (ref 3.5–5.1)
Sodium: 134 mmol/L — ABNORMAL LOW (ref 135–145)

## 2022-01-04 LAB — GLUCOSE, CAPILLARY: Glucose-Capillary: 209 mg/dL — ABNORMAL HIGH (ref 70–99)

## 2022-01-04 SURGERY — RIGHT HEART CATH
Anesthesia: LOCAL

## 2022-01-04 MED ORDER — ACETAMINOPHEN 325 MG PO TABS: 650.0000 mg | ORAL_TABLET | ORAL | Status: DC | PRN

## 2022-01-04 MED ORDER — LIDOCAINE HCL (PF) 1 % IJ SOLN
INTRAMUSCULAR | Status: AC
  Filled 2022-01-04: qty 30

## 2022-01-04 MED ORDER — LABETALOL HCL 5 MG/ML IV SOLN: 10.0000 mg | INTRAVENOUS | Status: DC | PRN

## 2022-01-04 MED ORDER — HEPARIN (PORCINE) IN NACL 1000-0.9 UT/500ML-% IV SOLN
INTRAVENOUS | Status: DC | PRN
  Administered 2022-01-04: 500 mL

## 2022-01-04 MED ORDER — LIDOCAINE HCL (PF) 1 % IJ SOLN
INTRAMUSCULAR | Status: DC | PRN
  Administered 2022-01-04: 2 mL

## 2022-01-04 MED ORDER — SODIUM CHLORIDE 0.9% FLUSH: 3.0000 mL | Freq: Two times a day (BID) | INTRAVENOUS | Status: DC

## 2022-01-04 MED ORDER — SODIUM CHLORIDE 0.9 % IV SOLN: 250.0000 mL | INTRAVENOUS | Status: DC | PRN

## 2022-01-04 MED ORDER — ONDANSETRON HCL 4 MG/2ML IJ SOLN: 4.0000 mg | Freq: Four times a day (QID) | INTRAMUSCULAR | Status: DC | PRN

## 2022-01-04 MED ORDER — HYDRALAZINE HCL 20 MG/ML IJ SOLN: 10.0000 mg | INTRAMUSCULAR | Status: DC | PRN

## 2022-01-04 MED ORDER — TORSEMIDE 20 MG PO TABS: 60.0000 mg | ORAL_TABLET | Freq: Every day | ORAL | 3 refills | Status: DC

## 2022-01-04 MED ORDER — SODIUM CHLORIDE 0.9% FLUSH: 3.0000 mL | INTRAVENOUS | Status: DC | PRN

## 2022-01-04 MED ORDER — SODIUM CHLORIDE 0.9 % IV SOLN: INTRAVENOUS | Status: DC

## 2022-01-04 SURGICAL SUPPLY — 7 items
CATH BALLN WEDGE 5F 110CM (CATHETERS) ×1 IMPLANT
KIT HEART LEFT (KITS) ×2 IMPLANT
PACK CARDIAC CATHETERIZATION (CUSTOM PROCEDURE TRAY) ×2 IMPLANT
PROTECTION STATION PRESSURIZED (MISCELLANEOUS) ×2
SHEATH GLIDE SLENDER 4/5FR (SHEATH) ×1 IMPLANT
STATION PROTECTION PRESSURIZED (MISCELLANEOUS) IMPLANT
TRANSDUCER W/STOPCOCK (MISCELLANEOUS) ×2 IMPLANT

## 2022-01-04 NOTE — Discharge Instructions (Addendum)
Increase torsemide to 60 mg daily (3 tabs) : CALL DR MCLEAN'S OFFICE IF ANY PROBLEMS, QUESTIONS OR CONCERNS; CALL IF ANY BLEEDING, DRAINAGE, FEVER, PAIN OR SWELLING RIGHT ARM SITE

## 2022-01-04 NOTE — Interval H&P Note (Signed)
History and Physical Interval Note:  01/04/2022 7:56 AM  [name]  has presented today for surgery, with the diagnosis of hp.  The various methods of treatment have been discussed with the patient and family. After consideration of risks, benefits and other options for treatment, the patient has consented to  Procedure(s): RIGHT HEART CATH (N/A) as a surgical intervention.  The patient's history has been reviewed, patient examined, no change in status, stable for surgery.  I have reviewed the patient's chart and labs.  Questions were answered to the patient's satisfaction.     Glender Augusta [employer]

## 2022-01-05 ENCOUNTER — Encounter (HOSPITAL_COMMUNITY): Payer: Self-pay | Admitting: Cardiology

## 2022-01-05 LAB — HYPERSENSITIVITY PNEUMONITIS
A. Pullulans Abs: NEGATIVE
A.Fumigatus #1 Abs: POSITIVE — AB
Micropolyspora faeni, IgG: NEGATIVE
Pigeon Serum Abs: NEGATIVE
Thermoact. Saccharii: NEGATIVE
Thermoactinomyces vulgaris, IgG: NEGATIVE

## 2022-01-05 NOTE — Assessment & Plan Note (Addendum)
-   Admitted 12/10/21-12/16/21 for acute on chronic respiratory failure secondary to heart failure, pulmonary fibrosis and OSA/OHS. Placing an order for [employer]. Referring to pulmonary rehab

## 2022-01-05 NOTE — Assessment & Plan Note (Signed)
-   Currently well controlled on CPAP

## 2022-01-05 NOTE — Assessment & Plan Note (Signed)
-   Stable; Feeling better since discharge. Noticed some increased swelling. Following with HF clinic, recently seen  - Continue Torsemide 40 mg daily - You can take Zaroxolyn 2.5 mg every 4 days as needed for swelling (I would notify cardiology first before taking)

## 2022-01-05 NOTE — Assessment & Plan Note (Addendum)
-   Abnormal groundglass opacities on HRCT from 12/13/21.  This has not significantly changed from prior scans over the past couple years.  Felt to clearly resemble pulmonary fibrosis.  No role for steroids or antibiotics at this time.  Recommend repeating serology. Get repeat HRCT in 3 months. Right heart cath to evaluate pulmonary hypertension, potentially may need to be started on vasodilators

## 2022-01-06 ENCOUNTER — Other Ambulatory Visit: Payer: Self-pay | Admitting: Internal Medicine

## 2022-01-09 NOTE — Telephone Encounter (Signed)
Called patient again about CGM coverage and was unable to reach. Left a voicemail requesting a call back.

## 2022-01-12 ENCOUNTER — Other Ambulatory Visit: Payer: Self-pay | Admitting: Cardiology

## 2022-01-12 ENCOUNTER — Telehealth: Payer: Self-pay | Admitting: Internal Medicine

## 2022-01-12 ENCOUNTER — Ambulatory Visit (INDEPENDENT_AMBULATORY_CARE_PROVIDER_SITE_OTHER): Payer: Medicare Other

## 2022-01-12 VITALS — BP 120/62 | HR 61 | Temp 98.8°F | Ht 66.5 in | Wt 240.0 lb

## 2022-01-12 DIAGNOSIS — Z0000 Encounter for general adult medical examination without abnormal findings: Secondary | ICD-10-CM | POA: Diagnosis not present

## 2022-01-12 NOTE — Progress Notes (Signed)
Subjective:   [name] is a 76 y.o. female who presents for Medicare Annual (Subsequent) preventive examination.  Review of Systems     Cardiac Risk Factors include: advanced age (>69mn, >>2women);hypertension;obesity (BMI >30kg/m2);diabetes mellitus     Objective:    Today's Vitals   01/12/22 0932 01/12/22 0935  BP: 120/62 120/62  Pulse:  61  Temp:  98.8 F (37.1 C)  TempSrc:  Oral  SpO2:  96%  Weight: 240 lb (108.9 kg) 240 lb (108.9 kg)  Height: 5' 6.5" (1.689 m) 5' 6.5" (1.689 m)  PainSc:  0-No pain   Body mass index is 38.16 kg/m.     01/12/2022    9:59 AM 01/04/2022    7:01 AM 12/11/2021    6:52 AM 11/30/2020    8:57 AM 07/07/2020    6:45 AM 06/26/2020   10:04 AM 05/20/2020    2:00 AM  Advanced Directives  Does Patient Have a Medical Advance Directive? Yes Yes Yes Yes Yes No Yes  Type of AParamedicof [place]ark[place]Living will [place] will Living will Healthcare Power of [place]Living will  [place] Does patient want to make changes to medical advance directive? No - Patient declined No - Patient declined No - Patient declined  No - Patient declined  No - Patient declined  Copy of [place]in Chart? No - copy requested No - copy requested  No - copy requested No - copy requested    Would patient like information on creating a medical advance directive?      No - Patient declined     Current Medications (verified) Outpatient Encounter Medications as of 01/12/2022  Medication Sig   apixaban (ELIQUIS) 5 MG TABS tablet Take 1 tablet (5 mg total) by mouth 2 (two) times daily.   Ascorbic Acid (VITAMIN C) 1000 MG tablet Take 1,000 mg by mouth at bedtime.  (Patient not taking: Reported on 01/12/2022)   atorvastatin (LIPITOR) 40 MG tablet TAKE 1 TABLET BY MOUTH DAILY (Patient taking differently: Take 40 mg by mouth daily.)   Calcium Carbonate-Vitamin D  (CALCIUM 600+D PO) Take 1 tablet by mouth daily.  (Patient not taking: Reported on 01/12/2022)   cholecalciferol (VITAMIN D) 25 MCG (1000 UNIT) tablet Take 1,000 Units by mouth at bedtime. (Patient not taking: Reported on 01/12/2022)   Continuous Blood Gluc Receiver (FREESTYLE LIBRE 14 DAY READER) DEVI Use   14 days for diabetes control. (Patient not taking: Reported on 12/29/2021)   Continuous Blood Gluc Sensor (FREESTYLE LIBRE 14 DAY SENSOR) MISC Apply for 14 days  for diabetes  monitoring. (Patient not taking: Reported on 12/29/2021)   diltiazem (CARDIZEM LA) 360 MG 24 hr tablet Take 1 tablet (360 mg total) by mouth daily.   dofetilide (TIKOSYN) 250 MCG capsule TAKE ONE CAPSULE BY MOUTH TWICE A DAY (Patient taking differently: Take 250 mcg by mouth 2 (two) times daily.)   DULoxetine (CYMBALTA) 60 MG capsule TAKE 1 CAPSULE BY MOUTH EVERY DAY (Patient taking differently: Take 60 mg by mouth daily.)   fluticasone (FLONASE) 50 MCG/ACT nasal spray Place 1 spray into both nostrils daily as needed for allergies.   JARDIANCE 25 MG TABS tablet TAKE 1 TABLET (25 MG TOTAL) BY MOUTH DAILY.   loratadine (CLARITIN) 10 MG tablet TAKE 1 TABLET BY MOUTH EVERY DAY (Patient taking differently: Take 10 mg by mouth daily as needed for allergies.)   Lysine  1000 MG TABS Take 1,000 mg by mouth at bedtime.    metFORMIN (GLUCOPHAGE) 1000 MG tablet Take 1 tablet (1,000 mg total) by mouth 2 (two) times daily.   metFORMIN (GLUCOPHAGE) 500 MG tablet TAKE 1 TABLET (500 MG TOTAL) BY MOUTH 2 (TWO) TIMES DAILY WITH A MEAL. INCREASE TO 3 PER DAY AND THEN 4 PER DAY   metolazone (ZAROXOLYN) 2.5 MG tablet TAKE 1 TABLET BY MOUTH EVERY 4  DAYS AS NEEDED FOR SWELLING  PLEASE KEEP UPCOMING APPOINTMENT IN JAN, 2023 FOR FURTHER REFILLS   metoprolol tartrate (LOPRESSOR) 50 MG tablet Take 50 mg by mouth 2 (two) times daily. 50 mg in the AM and 100 mg in the PM   Multiple Vitamin (MULTIVITAMIN WITH MINERALS) TABS tablet Take 1 tablet by mouth  daily. Centrum   nitroGLYCERIN (NITROSTAT) 0.4 MG SL tablet Place 1 tablet (0.4 mg total) under the tongue every 5 (five) minutes x 3 doses as needed for chest pain. PLEASE CALL [phone] AND SCHEDULE AN OVERDUE APPT WITH DR Acie Fredrickson FOR FURTHER REFILLS TO BE GRANTED (2ND ATTEMPT)   NON FORMULARY CPAP at bedtime   nystatin-triamcinolone (MYCOLOG II) cream Apply 1 application topically 2 (two) times daily as needed. (Patient taking differently: Apply 1 application  topically 2 (two) times daily as needed (rash).)   omeprazole (PRILOSEC) 20 MG capsule Take 20 mg by mouth 2 (two) times daily.   Polyethyl Glycol-Propyl Glycol (SYSTANE) 0.4-0.3 % SOLN Place 1 drop into both eyes at bedtime.   potassium chloride SA (KLOR-CON M) 20 MEQ tablet Take 1 tablet (20 mEq total) by mouth daily.   spironolactone (ALDACTONE) 25 MG tablet Take 0.5 tablets (12.5 mg total) by mouth daily.   torsemide (DEMADEX) 20 MG tablet Take 3 tablets (60 mg total) by mouth daily.   valACYclovir (VALTREX) 1000 MG tablet TAKE 2 TABLETS BY MOUTH EVERY 12 HOURS AS NEEDED (Patient taking differently: Take 2,000 mg by mouth 2 (two) times daily as needed (fever blisters).)   vitamin B-12 (CYANOCOBALAMIN) 1000 MCG tablet Take 1,000 mcg by mouth at bedtime. (Patient not taking: Reported on 01/12/2022)   No facility-administered encounter medications on file as of 01/12/2022.    Allergies (verified) Hydrocodone-guaifenesin, Adhesive [tape], and Pedi-pre tape spray [wound dressing adhesive]   History: Past Medical History:  Diagnosis Date   Anticoagulant long-term use    pradaxa   Anxiety    Arthritis    "fingers, lower back" (04/23/2017    CAD (coronary artery disease) 7408,1448   post PTCA with bare-metal stenting to mid RCA in December 2004      CHF (congestive heart failure) (HCC)    Chronic atrial fibrillation ([place]) 06/2007   Tachybradycardia pacemaker   Chronic kidney disease    10% function - ?R, other kidney is  compensating     CVA (cerebral vascular accident) [facility]) [phone]   denies residual on 04/23/2017   Depression    Diplopia 06/19/2008   Qualifier: Diagnosis of  By: [name] MD, [name]    Dysrhythmia    ATRIAL FIBRILATION   Edema of lower extremity    Hyperlipidemia    Hypertension    Inferior myocardial infarction [facility])    acute inferior wall mi/other medical hx   Myocardial infarction [facility]) 7026,3785   Obesity    OSA on CPAP    last test- 2010   Pacemaker    Pneumonia 2014   tx. ----  [facility]   Pulmonary hypertension ([place])    moderate pulmonary  hypertension by 10/2016 echo and 10/2013 cardiac cath   Shortness of breath    Skin cancer    "cut off right [place]; burned off LLE" (04/23/2017)   Sleep apnea    Spondylolisthesis    TIA (transient ischemic attack) 2008   Unspecified hemorrhoids without mention of complication 0/09/4915   Colonoscopy--Dr. Carlean Purl    Past Surgical History:  Procedure Laterality Date   ABDOMINAL HYSTERECTOMY     [place] N/A 09/29/2016   Procedure: Atrial Fibrillation Ablation;  Surgeon: Will Meredith Leeds, MD;  Location: [place] CV LAB;  Service: Cardiovascular;  Laterality: N/A;   ATRIAL FIBRILLATION ABLATION N/A 02/07/2018   Procedure: ATRIAL FIBRILLATION ABLATION;  Surgeon: Constance Haw, MD;  Location: [place] CV LAB;  Service: Cardiovascular;  Laterality: N/A;   ATRIAL FIBRILLATION ABLATION N/A 03/13/2019   Procedure: ATRIAL FIBRILLATION ABLATION;  Surgeon: Constance Haw, MD;  Location: [place] CV LAB;  Service: Cardiovascular;  Laterality: N/A;   ATRIAL FIBRILLATION ABLATION N/A 07/07/2020   Procedure: ATRIAL FIBRILLATION ABLATION;  Surgeon: Constance Haw, MD;  Location: [place] CV LAB;  Service: Cardiovascular;  Laterality: N/A;   BACK SURGERY     CARDIOVERSION N/A 09/12/2017   Procedure: CARDIOVERSION;  Surgeon: Jerline Pain, MD;  Location: [facility] ENDOSCOPY;   Service: Cardiovascular;  Laterality: N/A;   CARDIOVERSION N/A 12/13/2017   Procedure: CARDIOVERSION;  Surgeon: Sanda Klein, MD;  Location: [place] ENDOSCOPY;  Service: Cardiovascular;  Laterality: N/A;   CARDIOVERSION N/A 02/18/2018   Procedure: CARDIOVERSION;  Surgeon: Dorothy Spark, MD;  Location: [facility] ENDOSCOPY;  Service: Cardiovascular;  Laterality: N/A;   CARDIOVERSION N/A 05/20/2018   Procedure: CARDIOVERSION;  Surgeon: [name], MD;  Location: [facility] ENDOSCOPY;  Service: Cardiovascular;  Laterality: N/A;   CARDIOVERSION N/A 04/16/2019   Procedure: CARDIOVERSION;  Surgeon: Donato Heinz, MD;  Location: [place];  Service: Endoscopy;  Laterality: N/A;   CARDIOVERSION N/A 01/22/2020   Procedure: CARDIOVERSION;  Surgeon: Geralynn Rile, MD;  Location: [place];  Service: Cardiovascular;  Laterality: N/A;   CARDIOVERSION N/A 02/18/2020   Procedure: CARDIOVERSION;  Surgeon: Elouise Munroe, MD;  Location: [facility] END[facility]COPY;  Service: Cardiovascular;  Laterality: N/A;   CATARACT EXTRACTION W/ INTRAOCULAR LENS  IMPLANT, BILATERAL Bilateral 01/15/2017- 03/2017   CHOLECYSTECTOMY     CORONARY ANGIOPLASTY  X 2   CORONARY ANGIOPLASTY WITH [place]; ~ 2007; ?date   "1 stent; replaced stent; not sure when I got the last stent" (04/23/2017)   DOPPLER ECHOCARDIOGRAPHY  2009   ELECTROPHYSIOLOGIC STUDY N/A 03/31/2016   Procedure: Cardioversion;  Surgeon: Evans Lance, MD;  Location: [place] CV LAB;  Service: Cardiovascular;  Laterality: N/A;   ELECTROPHYSIOLOGIC STUDY N/A 08/04/2016   Procedure: Cardioversion;  Surgeon: Evans Lance, MD;  Location: [place] CV LAB;  Service: Cardiovascular;  Laterality: N/A;   INSERT / REPLACE / REMOVE PACEMAKER  06/2007   IR RADIOLOGY PERIPHERAL GUIDED IV START  01/31/2018   IR US GUIDE VASC ACCESS LEFT  01/31/2018   JOINT REPLACEMENT     LAPAROSCOPIC CHOLECYSTECTOMY  1994   LEFT AND RIGHT HEART CATHETERIZATION WITH CORONARY  ANGIOGRAM N/A 10/06/2013   Procedure: LEFT AND RIGHT HEART CATHETERIZATION WITH CORONARY ANGIOGRAM;  Surgeon: Troy Sine, MD;  Location: [facility] CATH LAB;  Service: Cardiovascular;  Laterality: N/A;   LEFT OOPHORECTOMY Left ~ [place]  2000s - 04/2015 X 3  L3-4; L4-5; L2-3; Dr Trenton Gammon   RIGHT HEART CATH N/A 01/04/2022   Procedure: RIGHT HEART CATH;  Surgeon: Larey Dresser, MD;  Location: [place] CV LAB;  Service: Cardiovascular;  Laterality: N/A;   RIGHT/LEFT HEART CATH AND CORONARY ANGIOGRAPHY N/A 05/09/2017   Procedure: RIGHT/LEFT HEART CATH AND CORONARY ANGIOGRAPHY;  Surgeon: Sherren Mocha, MD;  Location: [place] CV LAB;  Service: Cardiovascular;  Laterality: N/A;   SKIN CANCER EXCISION Right    [place]   TEE WITHOUT CARDIOVERSION N/A 09/29/2016   Procedure: TRANSESOPHAGEAL ECHOCARDIOGRAM (TEE);  Surgeon: Jerline Pain, MD;  Location: [facility] ENDOSCOPY;  Service: Cardiovascular;  Laterality: N/A;   TOTAL ABDOMINAL HYSTERECTOMY  1984   "uterus & right ovary"   TOTAL KNEE ARTHROPLASTY Left 04/23/2017   TOTAL KNEE ARTHROPLASTY Left 04/23/2017   Procedure: TOTAL KNEE ARTHROPLASTY;  Surgeon: Frederik Pear, MD;  Location: [place];  Service: Orthopedics;  Laterality: Left;   ULTRASOUND GUIDANCE FOR VASCULAR ACCESS  05/09/2017   Procedure: Ultrasound Guidance For Vascular Access;  Surgeon: Sherren Mocha, MD;  Location: [place] CV LAB;  Service: Cardiovascular;;   Family History  Problem Relation Age of Onset   Suicidality Father        suicide death pt was 3 yrs, 77   Arrhythmia Mother    Hypertension Mother    Diabetes Mother    Dementia Mother    Heart attack Brother    Heart disease Paternal Aunt    Prostate cancer Maternal Grandfather    Diabetes Paternal Grandfather        fathers side of the family   Colon cancer Maternal Aunt    Social History   Socioeconomic History   Marital status: Married    Spouse name: Diplomatic Services operational officer   Number of children: 0    Years of education: HS   Highest education level: High school graduate  Occupational History   Occupation: retired    Comment: previously worked [employer]  Tobacco Use   Smoking status: Former    Packs/day: 1.00    Years: 5.00    Total pack years: 5.00    Types: Cigarettes    Start date: 05/11/1978    Quit date: 07/03/1982    Years since quitting: 39.5   Smokeless tobacco: Never  Vaping Use   Vaping Use: Never used  Substance and Sexual Activity   Alcohol use: Yes   Drug use: No   Sexual activity: Not Currently  Other Topics Concern   Not on file  Social History Narrative   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   Married    Originally from [place] of two, high school education   Former smoker 1979-1984 1ppd   Hunting dogs 7    Retired from [place] 2    Social Determinants of Radio broadcast assistant Strain: [place]  (01/12/2022)   Overall Financial Resource Strain (CARDIA)    Difficulty of Paying Living Expenses: Not hard at all  Food Insecurity: No Food Insecurity (01/12/2022)   Hunger Vital Sign    Worried About Running Out of Food in the Last Year: Never true    [place] in the Last Year: Never true  Transportation Needs: No Transportation Needs (01/12/2022)   PRAPARE - Hydrologist (Medical): No    Lack of Transportation (Non-Medical): No  Physical Activity: Inactive (01/12/2022)   Exercise  Vital Sign    Days of Exercise per Week: 0 days    Minutes of Exercise per Session: 0 min  Stress: No Stress Concern Present (01/12/2022)   [place]    Feeling of Stress : Not at all  Social Connections: [place] (01/12/2022)   Social Connection and Isolation Panel [NHANES]    Frequency of Communication with Friends and Family: More than three times a week    Frequency of Social Gatherings with  Friends and Family: More than three times a week    Attends Religious Services: More than 4 times per year    Active Member of [employer] or Organizations: Yes    Attends Music therapist: More than 4 times per year    Marital Status: Married     Clinical Intake: Nutrition Risk Assessment:  Has the patient had any N/V/D within the last 2 months?  No  Does the patient have any non-healing wounds?  No  Has the patient had any unintentional weight loss or weight gain?  No   Diabetes:  Is the patient diabetic?  Yes  Pre Diabetic If diabetic, was a CBG obtained today?  Yes  CBG 197 Taken by patient Did the patient bring in their glucometer from home?  No  How often do you monitor your CBG's? Daily.   Financial Strains and Diabetes Management:  Are you having any financial strains with the device, your supplies or your medication? No .  Does the patient want to be seen by Chronic Care Management for management of their diabetes?  No  Would the patient like to be referred to a Nutritionist or for Diabetic Management?  No   Diabetic Exams:  Diabetic Eye Exam: Completed Yes. Overdue for diabetic eye exam. Pt has been advised about the importance in completing this exam. A referral has been placed today. Message sent to referral coordinator for scheduling purposes. Advised pt to expect a call from office referred to regarding appt.  Diabetic Foot Exam: Completed Yes. Pt has been advised about the importance in completing this exam. Pt is scheduled for diabetic foot exam on Followed by PCP.    Diabetic?  Yes   Activities of Daily Living    01/12/2022    9:52 AM 12/11/2021    6:52 AM  In your present state of health, do you have any difficulty performing the following activities:  Hearing? 0 0  Vision? 0 0  Difficulty concentrating or making decisions? 0 0  Walking or climbing stairs? 0 0  Dressing or bathing? 0 0  Doing errands, shopping? 0 0  Preparing Food and eating ? N    Using the Toilet? N   In the past six months, have you accidently leaked urine? Y   Comment Wears breifs. Followed by PCP   Do you have problems with loss of bowel control? N   Managing your Medications? N   Managing your Finances? N   Housekeeping or managing your Housekeeping? N     Patient Care Team: [name], [name], MD as PCP - General Curt Bears, Ocie Doyne, MD as PCP - Electrophysiology (Cardiology) Nahser, Wonda Cheng, MD as PCP - Cardiology (Cardiology) Gaynelle Arabian, MD as Consulting Physician (Orthopedic Surgery) Brand Males, MD as Consulting Physician (Pulmonary Disease) Viona Gilmore, [facility] as Pharmacist (Pharmacist)  Indicate any recent Medical Services you may have received from other than [facility] providers in the past year (date may be approximate).  Assessment:   This is a routine wellness examination for Roxie.  Hearing/Vision screen Hearing Screening - Comments:: No hearing difficulty Vision Screening - Comments:: Wears glasses. Followed by Dr Schuyler Amor.  Dietary issues and exercise activities discussed: Exercise limited by: None identified   Goals Addressed               This Visit's Progress     Increase physical activity (pt-stated)        Lose weight and eat healthy.       Depression Screen    01/12/2022    9:50 AM 12/26/2021    2:46 PM 11/30/2020    8:50 AM 11/17/2019   11:36 AM 05/06/2018    8:39 AM 05/15/2017   10:29 AM 01/23/2017    9:57 AM  PHQ 2/9 Scores  PHQ - 2 Score 0 2 1 0 0 0 0  PHQ- 9 Score 0 8         Fall Risk    01/12/2022    9:55 AM 12/26/2021    2:46 PM 08/23/2021    8:55 AM 11/30/2020    8:58 AM 06/07/2020    2:09 PM  Fall Risk   Falls in the past year? 0 0 0 0 1  Number falls in past yr: 0 0 0 0 0  Injury with Fall? 0 0 0 0 0  Risk for fall due to : No Fall Risks No Fall Risks Other (Comment) Impaired vision   Follow up  Falls prevention discussed Falls evaluation completed Falls prevention discussed     FALL  RISK PREVENTION PERTAINING TO THE HOME:  Any stairs in or around the home? Yes  If so, are there any without handrails? No  Home free of loose throw rugs in walkways, pet beds, electrical cords, etc? Yes  Adequate lighting in your home to reduce risk of falls? Yes   ASSISTIVE DEVICES UTILIZED TO PREVENT FALLS:  Life alert? No  Use of a cane, walker or w/c? Yes  Grab bars in the bathroom? Yes  Shower chair or bench in shower? Yes  Elevated toilet seat or a handicapped toilet? Yes   TIMED UP AND GO:  Was the test performed? Yes .  Length of time to ambulate 10 feet: 5 sec.   Gait steady and fast without use of assistive device  Cognitive Function:        01/12/2022    9:59 AM 11/30/2020    8:59 AM  6CIT Screen  What Year? 0 points 0 points  What month? 0 points 0 points  What time? 0 points 0 points  Count back from 20 0 points 0 points  Months in reverse 0 points 0 points  Repeat phrase 0 points 2 points  Total Score 0 points 2 points    Immunizations Immunization History  Administered Date(s) Administered   Fluad Quad(high Dose 65+) 04/10/2019, 04/20/2021   Influenza Split 04/18/2011, 05/06/2012   Influenza Whole 07/04/2007, 06/02/2009, 03/01/2010   Influenza, High Dose Seasonal PF 04/03/2014, 04/18/2016, 04/05/2017, 05/06/2018   Influenza,inj,Quad PF,6+ Mos 03/07/2013, 04/06/2015   PFIZER(Purple Top)SARS-COV-2 Vaccination 07/22/2019, 08/10/2019, 03/29/2020   PNEUMOCOCCAL CONJUGATE-20 04/22/2021   Pfizer Covid-19 Vaccine Bivalent Booster 55yr & up 04/22/2021   Pneumococcal Conjugate-13 09/24/2013, 04/20/2020   Pneumococcal Polysaccharide-23 07/03/2005, 07/04/2007, 04/18/2011   Td 06/02/2009   Zoster Recombinat (Shingrix) 04/09/2020   Zoster, Live 07/03/2006    Flu Vaccine status: Up to date  Pneumococcal vaccine status: Up to date  Covid-19 vaccine status:  Completed vaccines  Qualifies for Shingles Vaccine? Yes   Zostavax completed Yes   Shingrix  Completed?: Yes  Screening Tests Health Maintenance  Topic Date Due   FOOT EXAM  02/20/2022 (Originally 01/27/2021)   URINE MICROALBUMIN  02/20/2022 (Originally 01/23/2018)   COLONOSCOPY (Pts 45-44yr Insurance coverage will need to be confirmed)  02/20/2022 (Originally 07/04/2015)   Zoster Vaccines- Shingrix (2 of 2) 02/20/2022 (Originally 06/04/2020)   TETANUS/TDAP  01/13/2023 (Originally 06/03/2019)   INFLUENZA VACCINE  01/31/2022   HEMOGLOBIN A1C  06/27/2022   OPHTHALMOLOGY EXAM  07/05/2022   Pneumonia Vaccine 76 Years old  Completed   DEXA SCAN  Completed   COVID-19 Vaccine  Completed   Hepatitis C Screening  Completed   HPV VACCINES  Aged Out    Health Maintenance  There are no preventive care reminders to display for this patient.   Colorectal cancer screening: No longer required.   Mammogram status: No longer required due to Age.  Bone Density status: Completed 06/04/21. Results reflect: Bone density results: NORMAL. Repeat every   years.  Lung Cancer Screening: (Low Dose CT Chest recommended if Age 76-80years, 30 pack-year currently smoking OR have quit w/in 15years.) does not qualify.     Additional Screening:  Hepatitis C Screening: does qualify; Completed 01/23/17  Vision Screening: Recommended annual ophthalmology exams for early detection of glaucoma and other disorders of the eye. Is the patient up to date with their annual eye exam?  Yes  Who is the provider or what is the name of the office in which the patient attends annual eye exams? Dr GSchuyler AmorIf pt is not established with a provider, would they like to be referred to a provider to establish care? No .   Dental Screening: Recommended annual dental exams for proper oral hygiene  Community Resource Referral / Chronic Care Management:  CRR required this visit?  No   CCM required this visit?  No      Plan:     I have personally reviewed and noted the following in the patient's chart:   Medical and  social history Use of alcohol, tobacco or illicit drugs  Current medications and supplements including opioid prescriptions.  Functional ability and status Nutritional status Physical activity Advanced directives List of other physicians Hospitalizations, surgeries, and ER visits in previous 12 months Vitals Screenings to include cognitive, depression, and falls Referrals and appointments  In addition, I have reviewed and discussed with patient certain preventive protocols, quality metrics, and best practice recommendations. A written personalized care plan for preventive services as well as general preventive health recommendations were provided to patient.     BCriselda Peaches LPN   [id number]  Nurse Notes: None

## 2022-01-12 NOTE — Telephone Encounter (Signed)
Patient received a call on the 3rd--she was in the office and requested a call back.  She is wanting a call back on Friday, 07/13.

## 2022-01-12 NOTE — Patient Instructions (Addendum)
[name] , Thank you for taking time to come for your Medicare Wellness Visit. I appreciate your ongoing commitment to your health goals. Please review the following plan we discussed and let me know if I can assist you in the future.   These are the goals we discussed:  Goals       Increase physical activity (pt-stated)      Lose weight and eat healthy.      Patient Stated      Lose weight       Weight (lb) < 200 lb (90.7 kg)      Motivation is a 5 and family;  You feel better when you eat salads; cut back on high calories foods as you are doing  Check out  online nutrition programs as GumSearch.nl and http://vang.com/; fit31m; Look for foods with "whole" wheat; bran; oatmeal etc Shot at the farmer's markets in season for fresher choices  Watch for "hydrogenated" on the label of oils which are trans-fats.  Watch for "high fructose corn syrup" in snacks, yogurt or ketchup  Meats have less marbling; bright colored fruits and vegetables;  Canned; dump out liquid and wash vegetables. Be mindful of what we are eating  Portion control is essential to a health weight! Sit down; take a break and enjoy your meal; take smaller bites; put the fork down between bites;  It takes 20 minutes to get full; so check in with your fullness cues and stop eating when you start to fill full   Look up online 25 go to small 100  Or 150 calorie snacks and bag them Challenge yourself with new recipes  Adding protein to you meals is good; try to allow for one or two starches  Or pick your daily goal                This is a list of the screening recommended for you and due dates:  Health Maintenance  Topic Date Due   Complete foot exam   02/20/2022*   Urine Protein Check  02/20/2022*   Colon Cancer Screening  02/20/2022*   Zoster (Shingles) Vaccine (2 of 2) 02/20/2022*   Tetanus Vaccine  01/13/2023*   Flu Shot  01/31/2022   Hemoglobin A1C  06/27/2022   Eye exam for diabetics   07/05/2022   Pneumonia Vaccine  Completed   DEXA scan (bone density measurement)  Completed   COVID-19 Vaccine  Completed   Hepatitis C Screening: USPSTF Recommendation to screen - Ages 174-79yo.  Completed   HPV Vaccine  Aged Out  *Topic was postponed. The date shown is not the original due date.      Advanced directives: Yes Patient will submit copy  Conditions/risks identified: None  Next appointment: Follow up in one year for your annual wellness visit    Preventive Care 65 Years and Older, Female Preventive care refers to lifestyle choices and visits with your health care provider that can promote health and wellness. What does preventive care include? A yearly physical exam. This is also called an annual well check. Dental exams once or twice a year. Routine eye exams. Ask your health care provider how often you should have your eyes checked. Personal lifestyle choices, including: Daily care of your teeth and gums. Regular physical activity. Eating a healthy diet. Avoiding tobacco and drug use. Limiting alcohol use. Practicing safe sex. Taking low-dose aspirin every day. Taking vitamin and mineral supplements as recommended by your health care provider. What happens  during an annual well check? The services and screenings done by your health care provider during your annual well check will depend on your age, overall health, lifestyle risk factors, and family history of disease. Counseling  Your health care provider may ask you questions about your: Alcohol use. Tobacco use. Drug use. Emotional well-being. Home and relationship well-being. Sexual activity. Eating habits. History of falls. Memory and ability to understand (cognition). Work and work Statistician. Reproductive health. Screening  You may have the following tests or measurements: Height, weight, and BMI. Blood pressure. Lipid and cholesterol levels. These may be checked every 5 years, or more  frequently if you are over 50 years old. Skin check. Lung cancer screening. You may have this screening every year starting at age 50 if you have a 30-pack-year history of smoking and currently smoke or have quit within the past 15 years. Fecal occult blood test (FOBT) of the stool. You may have this test every year starting at age 50. Flexible sigmoidoscopy or colonoscopy. You may have a sigmoidoscopy every 5 years or a colonoscopy every 10 years starting at age 25. Hepatitis C blood test. Hepatitis B blood test. Sexually transmitted disease (STD) testing. Diabetes screening. This is done by checking your blood sugar (glucose) after you have not eaten for a while (fasting). You may have this done every 1-3 years. Bone density scan. This is done to screen for osteoporosis. You may have this done starting at age 91. Mammogram. This may be done every 1-2 years. Talk to your health care provider about how often you should have regular mammograms. Talk with your health care provider about your test results, treatment options, and if necessary, the need for more tests. Vaccines  Your health care provider may recommend certain vaccines, such as: Influenza vaccine. This is recommended every year. Tetanus, diphtheria, and acellular pertussis (Tdap, Td) vaccine. You may need a Td booster every 10 years. Zoster vaccine. You may need this after age 26. Pneumococcal 13-valent conjugate (PCV13) vaccine. One dose is recommended after age 29. Pneumococcal polysaccharide (PPSV23) vaccine. One dose is recommended after age 102. Talk to your health care provider about which screenings and vaccines you need and how often you need them. This information is not intended to replace advice given to you by your health care provider. Make sure you discuss any questions you have with your health care provider. Document Released: 07/16/2015 Document Revised: 03/08/2016 Document Reviewed: 04/20/2015 Elsevier Interactive  Patient Education  2017 [place] Prevention in the Home Falls can cause injuries. They can happen to people of all ages. There are many things you can do to make your home safe and to help prevent falls. What can I do on the outside of my home? Regularly fix the edges of walkways and driveways and fix any cracks. Remove anything that might make you trip as you walk through a door, such as a raised step or threshold. Trim any bushes or trees on the path to your home. Use bright outdoor lighting. Clear any walking paths of anything that might make someone trip, such as rocks or tools. Regularly check to see if handrails are loose or broken. Make sure that both sides of any steps have handrails. Any raised decks and porches should have guardrails on the edges. Have any leaves, snow, or ice cleared regularly. Use sand or salt on walking paths during winter. Clean up any spills in your garage right away. This includes oil or grease spills. What  can I do in the bathroom? Use night lights. Install grab bars by the toilet and in the tub and shower. Do not use towel bars as grab bars. Use non-skid mats or decals in the tub or shower. If you need to sit down in the shower, use a plastic, non-slip stool. Keep the floor dry. Clean up any water that spills on the floor as soon as it happens. Remove soap buildup in the tub or shower regularly. Attach bath mats securely with double-sided non-slip rug tape. Do not have throw rugs and other things on the floor that can make you trip. What can I do in the bedroom? Use night lights. Make sure that you have a light by your bed that is easy to reach. Do not use any sheets or blankets that are too big for your bed. They should not hang down onto the floor. Have a firm chair that has side arms. You can use this for support while you get dressed. Do not have throw rugs and other things on the floor that can make you trip. What can I do in the  kitchen? Clean up any spills right away. Avoid walking on wet floors. Keep items that you use a lot in easy-to-reach places. If you need to reach something above you, use a strong step stool that has a grab bar. Keep electrical cords out of the way. Do not use floor polish or wax that makes floors slippery. If you must use wax, use non-skid floor wax. Do not have throw rugs and other things on the floor that can make you trip. What can I do with my stairs? Do not leave any items on the stairs. Make sure that there are handrails on both sides of the stairs and use them. Fix handrails that are broken or loose. Make sure that handrails are as long as the stairways. Check any carpeting to make sure that it is firmly attached to the stairs. Fix any carpet that is loose or worn. Avoid having throw rugs at the top or bottom of the stairs. If you do have throw rugs, attach them to the floor with carpet tape. Make sure that you have a light switch at the top of the stairs and the bottom of the stairs. If you do not have them, ask someone to add them for you. What else can I do to help prevent falls? Wear shoes that: Do not have high heels. Have rubber bottoms. Are comfortable and fit you well. Are closed at the toe. Do not wear sandals. If you use a stepladder: Make sure that it is fully opened. Do not climb a closed stepladder. Make sure that both sides of the stepladder are locked into place. Ask someone to hold it for you, if possible. Clearly mark and make sure that you can see: Any grab bars or handrails. First and last steps. Where the edge of each step is. Use tools that help you move around (mobility aids) if they are needed. These include: Canes. Walkers. Scooters. Crutches. Turn on the lights when you go into a dark area. Replace any light bulbs as soon as they burn out. Set up your furniture so you have a clear path. Avoid moving your furniture around. If any of your floors are  uneven, fix them. If there are any pets around you, be aware of where they are. Review your medicines with your doctor. Some medicines can make you feel dizzy. This can increase your chance of  falling. Ask your doctor what other things that you can do to help prevent falls. This information is not intended to replace advice given to you by your health care provider. Make sure you discuss any questions you have with your health care provider. Document Released: 04/15/2009 Document Revised: 11/25/2015 Document Reviewed: 07/24/2014 Elsevier Interactive Patient Education  2017 [employer].

## 2022-01-15 NOTE — Progress Notes (Signed)
HEART IMPACT TRANSITIONS OF CARE    PCP: Primary Cardiologist:  HPI:       ROS: All systems negative except as listed in HPI, PMH and Problem List.  SH:  Social History   Socioeconomic History   Marital status: Married    Spouse name: Diplomatic Services operational officer   Number of children: 0   Years of education: HS   Highest education level: High school graduate  Occupational History   Occupation: retired    Comment: previously worked [employer]  Tobacco Use   Smoking status: Former    Packs/day: 1.00    Years: 5.00    Total pack years: 5.00    Types: Cigarettes    Start date: 05/11/1978    Quit date: 07/03/1982    Years since quitting: 39.5   Smokeless tobacco: Never  Vaping Use   Vaping Use: Never used  Substance and Sexual Activity   Alcohol use: Yes   Drug use: No   Sexual activity: Not Currently  Other Topics Concern   Not on file  Social History Narrative   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   Married    Originally from [place] of two, high school education   Former smoker 1979-1984 1ppd   Hunting dogs 7    Retired from [employer] 2    Social Determinants of Radio broadcast assistant Strain: [place]  (01/12/2022)   Overall Financial Resource Strain (CARDIA)    Difficulty of Paying Living Expenses: Not hard at all  Food Insecurity: No Food Insecurity (01/12/2022)   Hunger Vital Sign    Worried About Running Out of Food in the Last Year: Never true    [place] in the Last Year: Never true  Transportation Needs: No Transportation Needs (01/12/2022)   PRAPARE - Hydrologist (Medical): No    Lack of Transportation (Non-Medical): No  Physical Activity: Inactive (01/12/2022)   Exercise Vital Sign    Days of Exercise per Week: 0 days    Minutes of Exercise per Session: 0 min  Stress: No Stress Concern Present (01/12/2022)   [place]    Feeling of Stress : Not at all  Social Connections: [place] (01/12/2022)   Social Connection and Isolation Panel [NHANES]    Frequency of Communication with Friends and Family: More than three times a week    Frequency of Social Gatherings with Friends and Family: More than three times a week    Attends Religious Services: More than 4 times per year    Active Member of [employer] or Organizations: Yes    Attends Music therapist: More than 4 times per year    Marital Status: Married  Human resources officer Violence: Not At Risk (01/12/2022)   Humiliation, Afraid, Rape, and Kick questionnaire    Fear of Current or Ex-Partner: No    Emotionally Abused: No    Physically Abused: No    Sexually Abused: No    FH:  Family History  Problem Relation Age of Onset   Suicidality Father        suicide death pt was 3 yrs, 1950   Arrhythmia Mother    Hypertension Mother    Diabetes Mother    Dementia Mother    Heart attack Brother    Heart disease Paternal Aunt  Prostate cancer Maternal Grandfather    Diabetes Paternal Grandfather        fathers side of the family   Colon cancer Maternal Aunt     Past Medical History:  Diagnosis Date   Anticoagulant long-term use    pradaxa   Anxiety    Arthritis    "fingers, lower back" (04/23/2017    CAD (coronary artery disease) 5852,7782   post PTCA with bare-metal stenting to mid RCA in December 2004      CHF (congestive heart failure) (HCC)    Chronic atrial fibrillation ([place]) 06/2007   Tachybradycardia pacemaker   Chronic kidney disease    10% function - ?R, other kidney is compensating     CVA (cerebral vascular accident) ([place]) [phone]   denies residual on 04/23/2017   Depression    Diplopia 06/19/2008   Qualifier: Diagnosis of  By: [name] MD, [name]    Dysrhythmia    ATRIAL FIBRILATION   Edema of lower extremity    Hyperlipidemia    Hypertension    Inferior myocardial  infarction [facility])    acute inferior wall mi/other medical hx   Myocardial infarction [facility]) [phone]   Obesity    OSA on CPAP    last test- 2010   Pacemaker    Pneumonia 2014   tx. ----  [facility]   Pulmonary hypertension ([place])    moderate pulmonary hypertension by 10/2016 echo and 10/2013 cardiac cath   Shortness of breath    Skin cancer    "cut off right Bisch; burned off LLE" (04/23/2017)   Sleep apnea    Spondylolisthesis    TIA (transient ischemic attack) 2008   Unspecified hemorrhoids without mention of complication 6/76/1950   Colonoscopy--Dr. Carlean Purl     Current Outpatient Medications  Medication Sig Dispense Refill   apixaban (ELIQUIS) 5 MG TABS tablet Take 1 tablet (5 mg total) by mouth 2 (two) times daily. 180 tablet 1   Ascorbic Acid (VITAMIN C) 1000 MG tablet Take 1,000 mg by mouth at bedtime.  (Patient not taking: Reported on 01/12/2022)     atorvastatin (LIPITOR) 40 MG tablet TAKE 1 TABLET BY MOUTH DAILY (Patient taking differently: Take 40 mg by mouth daily.) 100 tablet 2   Calcium Carbonate-Vitamin D (CALCIUM 600+D PO) Take 1 tablet by mouth daily.  (Patient not taking: Reported on 01/12/2022)     cholecalciferol (VITAMIN D) 25 MCG (1000 UNIT) tablet Take 1,000 Units by mouth at bedtime. (Patient not taking: Reported on 01/12/2022)     Continuous Blood Gluc Receiver (FREESTYLE LIBRE 14 DAY READER) DEVI Use   14 days for diabetes control. (Patient not taking: Reported on 12/29/2021) 1 each 3   Continuous Blood Gluc Sensor (FREESTYLE LIBRE 14 DAY SENSOR) MISC Apply for 14 days  for diabetes  monitoring. (Patient not taking: Reported on 12/29/2021) 1 each 3   diltiazem (CARDIZEM LA) 360 MG 24 hr tablet TAKE 1 TABLET BY MOUTH DAILY 90 tablet 2   dofetilide (TIKOSYN) 250 MCG capsule TAKE ONE CAPSULE BY MOUTH TWICE A DAY (Patient taking differently: Take 250 mcg by mouth 2 (two) times daily.) 180 capsule 3   DULoxetine (CYMBALTA) 60 MG capsule TAKE 1 CAPSULE BY MOUTH EVERY  DAY (Patient taking differently: Take 60 mg by mouth daily.) 90 capsule 0   fluticasone (FLONASE) 50 MCG/ACT nasal spray Place 1 spray into both nostrils daily as needed for allergies.     JARDIANCE 25 MG TABS tablet TAKE 1 TABLET (25 MG  TOTAL) BY MOUTH DAILY. 30 tablet 3   loratadine (CLARITIN) 10 MG tablet TAKE 1 TABLET BY MOUTH EVERY DAY (Patient taking differently: Take 10 mg by mouth daily as needed for allergies.) 90 tablet 0   Lysine 1000 MG TABS Take 1,000 mg by mouth at bedtime.      metFORMIN (GLUCOPHAGE) 1000 MG tablet Take 1 tablet (1,000 mg total) by mouth 2 (two) times daily. 180 tablet 1   metFORMIN (GLUCOPHAGE) 500 MG tablet TAKE 1 TABLET (500 MG TOTAL) BY MOUTH 2 (TWO) TIMES DAILY WITH A MEAL. INCREASE TO 3 PER DAY AND THEN 4 PER DAY 360 tablet 0   metolazone (ZAROXOLYN) 2.5 MG tablet TAKE 1 TABLET BY MOUTH EVERY 4  DAYS AS NEEDED FOR SWELLING  PLEASE KEEP UPCOMING APPOINTMENT IN JAN, 2023 FOR FURTHER REFILLS 25 tablet 2   metoprolol tartrate (LOPRESSOR) 50 MG tablet Take 50 mg by mouth 2 (two) times daily. 50 mg in the AM and 100 mg in the PM     Multiple Vitamin (MULTIVITAMIN WITH MINERALS) TABS tablet Take 1 tablet by mouth daily. Centrum     nitroGLYCERIN (NITROSTAT) 0.4 MG SL tablet Place 1 tablet (0.4 mg total) under the tongue every 5 (five) minutes x 3 doses as needed for chest pain. PLEASE CALL [phone] AND SCHEDULE AN OVERDUE APPT WITH DR Acie Fredrickson FOR FURTHER REFILLS TO BE GRANTED (2ND ATTEMPT) 15 tablet 0   NON FORMULARY CPAP at bedtime     nystatin-triamcinolone (MYCOLOG II) cream Apply 1 application topically 2 (two) times daily as needed. (Patient taking differently: Apply 1 application  topically 2 (two) times daily as needed (rash).) 30 g 1   omeprazole (PRILOSEC) 20 MG capsule Take 20 mg by mouth 2 (two) times daily.     Polyethyl Glycol-Propyl Glycol (SYSTANE) 0.4-0.3 % SOLN Place 1 drop into both eyes at bedtime.     potassium chloride SA (KLOR-CON M) 20 MEQ  tablet Take 1 tablet (20 mEq total) by mouth daily.     spironolactone (ALDACTONE) 25 MG tablet Take 0.5 tablets (12.5 mg total) by mouth daily. 45 tablet 3   torsemide (DEMADEX) 20 MG tablet Take 3 tablets (60 mg total) by mouth daily. 360 tablet 3   valACYclovir (VALTREX) 1000 MG tablet TAKE 2 TABLETS BY MOUTH EVERY 12 HOURS AS NEEDED (Patient taking differently: Take 2,000 mg by mouth 2 (two) times daily as needed (fever blisters).) 34 tablet 2   vitamin B-12 (CYANOCOBALAMIN) 1000 MCG tablet Take 1,000 mcg by mouth at bedtime. (Patient not taking: Reported on 01/12/2022)     No current facility-administered medications for this visit.    There were no vitals filed for this visit.  PHYSICAL EXAM:  General:  Well appearing. No resp difficulty HEENT: normal Neck: supple. JVP flat. Carotids 2+ bilaterally; no bruits. No lymphadenopathy or thryomegaly appreciated. Cor: PMI normal. Regular rate & rhythm. No rubs, gallops or murmurs. Lungs: clear Abdomen: soft, nontender, nondistended. No hepatosplenomegaly. No bruits or masses. Good bowel sounds. Extremities: no cyanosis, clubbing, rash, edema Neuro: alert & orientedx3, cranial nerves grossly intact. Moves all 4 extremities w/o difficulty. Affect pleasant.   ECG:   ASSESSMENT & PLAN:        Follow up

## 2022-01-16 ENCOUNTER — Telehealth: Payer: Self-pay | Admitting: Cardiovascular Disease

## 2022-01-16 ENCOUNTER — Encounter (HOSPITAL_COMMUNITY): Payer: Self-pay

## 2022-01-16 ENCOUNTER — Ambulatory Visit (HOSPITAL_COMMUNITY)
Admit: 2022-01-16 | Discharge: 2022-01-16 | Disposition: A | Discharge status: Home / Self Care | Payer: Medicare Other | Attending: Adult Health | Admitting: Adult Health

## 2022-01-16 ENCOUNTER — Telehealth (HOSPITAL_COMMUNITY): Payer: Self-pay | Admitting: *Deleted

## 2022-01-16 VITALS — BP 154/66 | HR 66 | Wt 242.8 lb

## 2022-01-16 DIAGNOSIS — N1832 Chronic kidney disease, stage 3b: Secondary | ICD-10-CM | POA: Diagnosis not present

## 2022-01-16 DIAGNOSIS — I5032 Chronic diastolic (congestive) heart failure: Secondary | ICD-10-CM | POA: Diagnosis not present

## 2022-01-16 DIAGNOSIS — Z79899 Other long term (current) drug therapy: Secondary | ICD-10-CM | POA: Insufficient documentation

## 2022-01-16 DIAGNOSIS — Z7984 Long term (current) use of oral hypoglycemic drugs: Secondary | ICD-10-CM | POA: Insufficient documentation

## 2022-01-16 DIAGNOSIS — I2510 Atherosclerotic heart disease of native coronary artery without angina pectoris: Secondary | ICD-10-CM

## 2022-01-16 DIAGNOSIS — E1122 Type 2 diabetes mellitus with diabetic chronic kidney disease: Secondary | ICD-10-CM | POA: Diagnosis not present

## 2022-01-16 DIAGNOSIS — Z7901 Long term (current) use of anticoagulants: Secondary | ICD-10-CM | POA: Insufficient documentation

## 2022-01-16 DIAGNOSIS — E669 Obesity, unspecified: Secondary | ICD-10-CM | POA: Diagnosis not present

## 2022-01-16 DIAGNOSIS — J9611 Chronic respiratory failure with hypoxia: Secondary | ICD-10-CM | POA: Diagnosis not present

## 2022-01-16 DIAGNOSIS — I130 Hypertensive heart and chronic kidney disease with heart failure and stage 1 through stage 4 chronic kidney disease, or unspecified chronic kidney disease: Secondary | ICD-10-CM | POA: Insufficient documentation

## 2022-01-16 DIAGNOSIS — I480 Paroxysmal atrial fibrillation: Secondary | ICD-10-CM | POA: Diagnosis not present

## 2022-01-16 DIAGNOSIS — G4733 Obstructive sleep apnea (adult) (pediatric): Secondary | ICD-10-CM | POA: Insufficient documentation

## 2022-01-16 DIAGNOSIS — J849 Interstitial pulmonary disease, unspecified: Secondary | ICD-10-CM | POA: Diagnosis not present

## 2022-01-16 DIAGNOSIS — I495 Sick sinus syndrome: Secondary | ICD-10-CM | POA: Diagnosis not present

## 2022-01-16 DIAGNOSIS — I4892 Unspecified atrial flutter: Secondary | ICD-10-CM | POA: Insufficient documentation

## 2022-01-16 DIAGNOSIS — Z955 Presence of coronary angioplasty implant and graft: Secondary | ICD-10-CM | POA: Insufficient documentation

## 2022-01-16 DIAGNOSIS — Z950 Presence of cardiac pacemaker: Secondary | ICD-10-CM | POA: Insufficient documentation

## 2022-01-16 DIAGNOSIS — I272 Pulmonary hypertension, unspecified: Secondary | ICD-10-CM | POA: Diagnosis not present

## 2022-01-16 LAB — BASIC METABOLIC PANEL
Anion gap: 11 (ref 5–15)
BUN: 18 mg/dL (ref 8–23)
CO2: 32 mmol/L (ref 22–32)
Calcium: 9.6 mg/dL (ref 8.9–10.3)
Chloride: 95 mmol/L — ABNORMAL LOW (ref 98–111)
Creatinine, Ser: 1.42 mg/dL — ABNORMAL HIGH (ref 0.44–1.00)
GFR, Estimated: 39 mL/min — ABNORMAL LOW (ref >60)
Glucose, Bld: 165 mg/dL — ABNORMAL HIGH (ref 70–99)
Potassium: 4.5 mmol/L (ref 3.5–5.1)
Sodium: 138 mmol/L (ref 135–145)

## 2022-01-16 NOTE — Progress Notes (Signed)
Chronic Care Management Pharmacy Note  01/17/2022 Name:  [name] MRN:  [id number] DOB:  03-11-1946  Summary: A1c not at goal of < 7% Pt was taking a double dose of diltiazem as she was receiving from 2 pharmacies   Recommendations/Changes made from today's visit: -Recommended Dexcom receiver and sensors prescriptions   -Recommended Tresiba 10 units daily -Recommended BP monitoring at home and only taking 1 diltiazem daily -Consider adherence packaging for medications    Plan: Follow up DM assessment in 1 month  Subjective: [name] is an 75 y.o. year old female who is a primary patient of [name], [name], MD.  The CCM team was consulted for assistance with disease management and care coordination needs.    Engaged with patient by telephone for follow up visit in response to provider referral for pharmacy case management and/or care coordination services.   Consent to Services:  The patient was given information about Chronic Care Management services, agreed to services, and gave verbal consent prior to initiation of services.  Please see initial visit note for detailed documentation.   Patient Care Team: [name], [name], MD as PCP - General Curt Bears, Ocie Doyne, MD as PCP - Electrophysiology (Cardiology) Nahser, Wonda Cheng, MD as PCP - Cardiology (Cardiology) Gaynelle Arabian, MD as Consulting Physician (Orthopedic Surgery) Brand Males, MD as Consulting Physician (Pulmonary Disease) Viona Gilmore, [facility] as Pharmacist (Pharmacist)  Recent office visits: 01/12/22 Rolene Arbour, LPN: Patient presented for AWV.  12/26/21 [name], MD: Patient presented for hospital follow up.  08/23/21 [name], MD: Patient presented for diabetes follow- up and abdominal pain. A1c slightly decreased to 7.7%. Plan for CGM start and reached out to [facility] team for support.  04/20/21 [name], [name] - Patient presented for queasiness and other concerns. No medication changes. A1c  increased to 8.2%.  Recent consult visits: 01/16/22 Patient presented for Heart & Vascular Transitions of Care appointment.   01/04/22 Patient presented for right heart cath.  12/29/21 Geraldo Pitter, NP (pulmonary): Patient presented for ILD and hospital follow up. Follow up in 3 months.  12/26/21 Marlyce Huge, PA-C (CHF clinic): Patient presented for hospital follow up. Decreased potassium to 20 mEq per day and started spironolactone 12.5 mg daily.   10/06/21 Tommye Standard, PA-C (cardiology): Patient presented for Afib follow up. Changed metoprolol to 50 mg in the AM and 100 mg in the PM. Follow up in 6 months.  Hospital visits: 6/10-6/16/23 Patient admitted to [facility] for CHF exacerbation. Prescribed metolazone for PRN use.  Objective:  Lab Results  Component Value Date   CREATININE 1.42 (H) 01/16/2022   BUN 18 01/16/2022   GFR 38.69 (L) 12/28/2021   GFRNONAA 39 (L) 01/16/2022   GFRAA 40 (L) 07/28/2020   NA 138 01/16/2022   K 4.5 01/16/2022   CALCIUM 9.6 01/16/2022   CO2 32 01/16/2022   GLUCOSE 165 (H) 01/16/2022    Lab Results  Component Value Date/Time   HGBA1C 7.7 (A) 12/26/2021 03:38 PM   HGBA1C 8.0 (H) 08/23/2021 09:35 AM   HGBA1C 8.2 (H) 04/20/2021 11:30 AM   GFR 38.69 (L) 12/28/2021 09:04 AM   GFR 42.98 (L) 08/23/2021 09:35 AM   MICROALBUR <0.7 01/23/2017 10:51 AM    Last diabetic Eye exam:  Lab Results  Component Value Date/Time   HMDIABEYEEXA No Retinopathy 07/05/2021 03:22 PM    Last diabetic Foot exam: No results found for: "HMDIABFOOTEX"   Lab Results  Component Value Date  CHOL 147 08/23/2021   HDL 47.20 08/23/2021   LDLCALC 68 04/10/2019   LDLDIRECT 76.0 08/23/2021   TRIG 206.0 (H) 08/23/2021   CHOLHDL 3 08/23/2021       Latest Ref Rng & Units 12/26/2021    9:56 AM 12/11/2021    3:19 AM 12/10/2021   12:22 AM  Hepatic Function  Total Protein 6.5 - 8.1 g/dL 7.5  6.9  6.9   Albumin 3.5 - 5.0 g/dL 3.9  3.4  3.6   AST 15 - 41  U/L 36  28  73   ALT 0 - 44 U/L 46  47  65   Alk Phosphatase 38 - 126 U/L 83  84  88   Total Bilirubin 0.3 - 1.2 mg/dL 0.7  1.1  1.1     Lab Results  Component Value Date/Time   TSH 4.61 08/23/2021 09:35 AM   TSH 4.37 04/20/2021 11:30 AM   FREET4 0.86 04/20/2021 11:30 AM   FREET4 0.83 05/06/2018 09:23 AM       Latest Ref Rng & Units 01/04/2022    8:11 AM 12/16/2021    3:09 AM 12/15/2021    4:03 AM  CBC  WBC 4.0 - 10.5 K/uL  8.0  8.4   Hemoglobin 12.0 - 15.0 g/dL 12.0 - 15.0 g/dL 14.3    14.6  13.1  13.4   Hematocrit 36.0 - 46.0 % 36.0 - 46.0 % 42.0    43.0  40.3  41.3   Platelets 150 - 400 K/uL  255  253     Lab Results  Component Value Date/Time   VD25OH 79.26 12/28/2021 09:04 AM   VD25OH 32.83 01/23/2017 10:51 AM    Clinical ASCVD: No  The 10-year ASCVD risk score (Arnett DK, et al., 2019) is: 47.5%   Values used to calculate the score:     Age: 32 years     Sex: Female     Is Non-Hispanic African American: No     Diabetic: Yes     Tobacco smoker: No     Systolic Blood Pressure: 660 mmHg     Is BP treated: Yes     HDL Cholesterol: 47.2 mg/dL     Total Cholesterol: 147 mg/dL       01/12/2022    9:50 AM 12/26/2021    2:46 PM 11/30/2020    8:50 AM  Depression screen PHQ 2/9  Decreased Interest 0 1 0  Down, Depressed, Hopeless 0 1 1  PHQ - 2 Score 0 2 1  Altered sleeping 0 2   Tired, decreased energy 0 1   Change in appetite 0 2   Feeling bad or failure about yourself  0 0   Trouble concentrating 0 1   Moving slowly or fidgety/restless 0 0   Suicidal thoughts 0 0   PHQ-9 Score 0 8   Difficult doing work/chores Not difficult at all Somewhat difficult      CHA2DS2/VAS Stroke Risk Points  Current as of about an hour ago     6 >= 2 Points: High Risk  1 - 1.99 Points: Medium Risk  0 Points: Low Risk    Last Change: N/A      Details    This score determines the patient's risk of having a stroke if the  patient has atrial fibrillation.       Points  Metrics  1 Has Congestive Heart Failure:  Yes    Current as of about an hour ago  1 Has Vascular Disease:  Yes    Current as of about an hour ago  1 Has Hypertension:  Yes    Current as of about an hour ago  1 Age:  65    Current as of about an hour ago  1 Has Diabetes:  Yes    Current as of about an hour ago  0 Had Stroke:  No  Had TIA:  No  Had Thromboembolism:  No    Current as of about an hour ago  1 Female:  Yes    Current as of about an hour ago     Social History   Tobacco Use  Smoking Status Former   Packs/day: 1.00   Years: 5.00   Total pack years: 5.00   Types: Cigarettes   Start date: 05/11/1978   Quit date: 07/03/1982   Years since quitting: 39.5  Smokeless Tobacco Never   BP Readings from Last 3 Encounters:  01/16/22 (!) 154/66  01/12/22 120/62  01/04/22 (!) 110/45   Pulse Readings from Last 3 Encounters:  01/16/22 66  01/12/22 61  01/04/22 65   Wt Readings from Last 3 Encounters:  01/16/22 242 lb 12.8 oz (110.1 kg)  01/12/22 240 lb (108.9 kg)  01/04/22 238 lb (108 kg)   BMI Readings from Last 3 Encounters:  01/16/22 38.60 kg/m  01/12/22 38.16 kg/m  01/04/22 37.84 kg/m    Assessment/Interventions: Review of patient past medical history, allergies, medications, health status, including review of consultants reports, laboratory and other test data, was performed as part of comprehensive evaluation and provision of chronic care management services.   SDOH:  (Social Determinants of Health) assessments and interventions performed: Yes SDOH Interventions    Flowsheet Row Most Recent Value  SDOH Interventions   Financial Strain Interventions Intervention Not Indicated, Other (Comment)  [patient denied patient assistance]      SDOH Screenings   Alcohol Screen: Low Risk  (01/12/2022)   Alcohol Screen    Last Alcohol Screening Score (AUDIT): 1  Depression (PHQ2-9): Low Risk  (01/12/2022)   Depression (PHQ2-9)    PHQ-2 Score: 0  Recent Concern:  Depression (PHQ2-9) - Medium Risk (12/26/2021)   Depression (PHQ2-9)    PHQ-2 Score: 8  Financial Resource Strain: Low Risk  (01/17/2022)   Overall Financial Resource Strain (CARDIA)    Difficulty of Paying Living Expenses: Not very hard  Food Insecurity: No Food Insecurity (01/12/2022)   Hunger Vital Sign    Worried About Running Out of Food in the Last Year: Never true    [place] in the Last Year: Never true  Housing: Low Risk  (01/12/2022)   Housing    Last Housing Risk Score: 0  Physical Activity: Inactive (01/12/2022)   Exercise Vital Sign    Days of Exercise per Week: 0 days    Minutes of Exercise per Session: 0 min  Social Connections: Socially Integrated (01/12/2022)   Social Connection and Isolation Panel [NHANES]    Frequency of Communication with Friends and Family: More than three times a week    Frequency of Social Gatherings with Friends and Family: More than three times a week    Attends Religious Services: More than 4 times per year    Active Member of [employer] or Organizations: Yes    Attends Archivist Meetings: More than 4 times per year    Marital Status: Married  Stress: No Stress Concern Present (01/12/2022)   [employer] -  Occupational Stress Questionnaire    Feeling of Stress : Not at all  Tobacco Use: Medium Risk (01/16/2022)   Patient History    Smoking Tobacco Use: Former    Smokeless Tobacco Use: Never    Passive Exposure: Not on file  Transportation Needs: No Transportation Needs (01/12/2022)   PRAPARE - Hydrologist (Medical): No    Lack of Transportation (Non-Medical): No     CCM Care Plan  Allergies  Allergen Reactions   Hydrocodone-Guaifenesin Other (See Comments)    Confusion/ memory loss   Adhesive [Tape] Rash and Other (See Comments)    Allergic to defibrillation pads.   Pedi-Pre Tape Spray [Wound Dressing Adhesive] Other (See Comments) and Rash    Allergic to  defibrillation pads.    Medications Reviewed Today     Reviewed by Viona Gilmore, [facility] (Pharmacist) on 01/17/22 at [place] List Status: <None>   Medication Order Taking? Sig Documenting Provider Last Dose Status Informant  apixaban (ELIQUIS) 5 MG TABS tablet [id number]  Take 1 tablet (5 mg total) by mouth 2 (two) times daily. Constance Haw, MD  Active Self  atorvastatin (LIPITOR) 40 MG tablet [id number]  TAKE 1 TABLET BY MOUTH DAILY Camnitz, Ocie Doyne, MD  Active Self  Continuous Blood Gluc Receiver (FREESTYLE LIBRE 14 DAY READER) DEVI [id number]  Use   14 days for diabetes control.  Patient not taking: Reported on 12/29/2021   [name], MD  Active   diltiazem (CARDIZEM LA) 360 MG 24 hr tablet [id number]  TAKE 1 TABLET BY MOUTH DAILY Camnitz, Will Hassell Done, MD  Active   dofetilide (TIKOSYN) 250 MCG capsule [id number]  TAKE ONE CAPSULE BY MOUTH TWICE A DAY Camnitz, Will Hassell Done, MD  Active Self  DULoxetine (CYMBALTA) 60 MG capsule [id number]  TAKE 1 CAPSULE BY MOUTH EVERY DAY [name], [name], MD  Active Self  fluticasone (FLONASE) 50 MCG/ACT nasal spray [id number]  Place 1 spray into both nostrils daily as needed for allergies. [provider]  Active Self  JARDIANCE 25 MG TABS tablet [id number]  TAKE 1 TABLET (25 MG TOTAL) BY MOUTH DAILY. [name], [name], MD  Active   loratadine (CLARITIN) 10 MG tablet [id number]  TAKE 1 TABLET BY MOUTH EVERY DAY  Patient taking differently: Take 10 mg by mouth daily as needed for allergies.   [name], [name], MD  Active Self  Lysine 1000 MG TABS [id number] Yes Take 1,000 mg by mouth at bedtime.  [provider] Taking Active Self  metFORMIN (GLUCOPHAGE) 1000 MG tablet [id number]  Take 1 tablet (1,000 mg total) by mouth 2 (two) times daily. [name], [name], MD  Active Self  metolazone (ZAROXOLYN) 2.5 MG tablet [id number]  TAKE 1 TABLET BY MOUTH EVERY 4  DAYS AS NEEDED FOR SWELLING  PLEASE KEEP UPCOMING APPOINTMENT IN JAN, 2023 FOR  FURTHER REFILLS Camnitz, Will Hassell Done, MD  Active Self  metoprolol tartrate (LOPRESSOR) 50 MG tablet [id number]  Take 50 mg by mouth 2 (two) times daily. 50 mg in the AM and 100 mg in the PM [provider]  Active   Multiple Vitamin (MULTIVITAMIN WITH MINERALS) TABS tablet [id number] Yes Take 1 tablet by mouth daily. Centrum [provider] Taking Active Self  nitroGLYCERIN (NITROSTAT) 0.4 MG SL tablet [id number]  Place 1 tablet (0.4 mg total) under the tongue every 5 (five) minutes x 3 doses as needed for chest pain. PLEASE CALL [phone] AND SCHEDULE AN OVERDUE APPT WITH  DR Acie Fredrickson FOR FURTHER REFILLS TO BE GRANTED (2ND ATTEMPT) Nahser, Wonda Cheng, MD  Active Self  NON FORMULARY [id number]  CPAP at bedtime [provider]  Active Self  nystatin-triamcinolone (MYCOLOG II) cream [id number]  Apply 1 application topically 2 (two) times daily as needed.  Patient taking differently: Apply 1 application  topically 2 (two) times daily as needed (rash).   [name], [name], MD  Active Self  omeprazole (PRILOSEC) 20 MG capsule [id number]  Take 20 mg by mouth 2 (two) times daily. [provider]  Active Self  Polyethyl Glycol-Propyl Glycol (SYSTANE) 0.4-0.3 % SOLN [id number] Yes Place 1 drop into both eyes at bedtime. [provider] Taking Active Self  potassium chloride SA (KLOR-CON M) 20 MEQ tablet [id number]  Take 1 tablet (20 mEq total) by mouth daily. Joette Catching, PA-C  Active   spironolactone (ALDACTONE) 25 MG tablet [id number]  Take 0.5 tablets (12.5 mg total) by mouth daily. Joette Catching, PA-C  Active   torsemide [facility]) 20 MG tablet [id number]  Take 3 tablets (60 mg total) by mouth daily. Larey Dresser, MD  Active   valACYclovir (VALTREX) 1000 MG tablet [id number]  TAKE 2 TABLETS BY MOUTH EVERY 12 HOURS AS NEEDED  Patient taking differently: Take 2,000 mg by mouth 2 (two) times daily as needed (fever blisters).   [name], [name], MD  Active Self             Patient Active Problem List   Diagnosis Date Noted   ILD (interstitial lung disease) ([place])    Pulmonary fibrosis ([place])    SIRS (systemic inflammatory response syndrome) ([place]) 12/10/2021   Hyperkalemia 12/10/2021   Type 2 diabetes mellitus with hyperlipidemia ([place]) 05/22/2020   Acute respiratory failure ([place]) 05/19/2020   Hypokalemia 05/19/2020   Secondary hypercoagulable state ([place]) 02/16/2020   CHF exacerbation ([place]) 05/31/2018   Bronchitis 05/31/2018   Primary osteoarthritis of left knee 04/23/2017   Degenerative arthritis of left knee 04/19/2017   Acute on chronic diastolic CHF (congestive heart failure) ([place]) 11/12/2016   Atypical atrial flutter ([place]) 09/30/2016   Hematoma 09/30/2016   Hypotension 09/30/2016   Persistent atrial fibrillation ([place]) 09/29/2016   Hyperlipidemia    HLD (hyperlipidemia)    Controlled type 2 diabetes mellitus without complication ([place])    Acute respiratory failure with hypoxia ([place]) 03/26/2016   DDD (degenerative disc disease), lumbar 04/05/2015   Degenerative disc disease, lumbar 04/05/2015   Severe obesity (BMI >= 40) ([place]) 02/16/2015   Fatty liver disease, nonalcoholic 75/64/3329   Cough, persistent 04/03/2014   Acute on chronic diastolic congestive heart failure ([place]) 10/07/2013   Pre-diabetes 09/24/2013   Peripheral edema 08/05/2013   Neoplasm of uncertain behavior of skin of back 11/09/2012   Edema 11/04/2012   Bleeding mole 11/04/2012   Pulmonary hypertension ([place]) 06/11/2012   Pneumonia 06/05/2012   Hypoxia 06/05/2012   Anemia 06/05/2012   Hemorrhoids 05/11/2012   Dyspepsia 05/11/2012   Medication management 04/20/2011   Positional vertigo 10/07/2010   Anticoagulant long-term use    HERPES LABIALIS 12/30/2009   INTERTRIGO 12/30/2009   Class 2 obesity 06/02/2009   Obstructive sleep apnea 10/12/2008   HYPERLIPIDEMIA 09/15/2008   SNORING 09/15/2008   Essential hypertension 06/19/2008   CAD S/P percutaneous  coronary angioplasty 06/19/2008   Paroxysmal atrial fibrillation ([place]) 06/19/2008   BRADYCARDIA-TACHYCARDIA SYNDROME 06/19/2008   CEREBRAL ANEURYSM 06/19/2008   Allergic rhinitis 06/19/2008   HYPERGLYCEMIA 06/19/2008   DEPRESSION, HX OF 06/19/2008   CEREBROVASCULAR  ACCIDENT, HX OF 06/19/2008   PACEMAKER, PERMANENT 06/19/2008    Immunization History  Administered Date(s) Administered   Fluad Quad(high Dose 65+) 04/10/2019, 04/20/2021   Influenza Split 04/18/2011, 05/06/2012   Influenza Whole 07/04/2007, 06/02/2009, 03/01/2010   Influenza, High Dose Seasonal PF 04/03/2014, 04/18/2016, 04/05/2017, 05/06/2018   Influenza,inj,Quad PF,6+ Mos 03/07/2013, 04/06/2015   PFIZER(Purple Top)SARS-COV-2 Vaccination 07/22/2019, 08/10/2019, 03/29/2020   PNEUMOCOCCAL CONJUGATE-20 04/22/2021   Pfizer Covid-19 Vaccine Bivalent Booster 23yr & up 04/22/2021   Pneumococcal Conjugate-13 09/24/2013, 04/20/2020   Pneumococcal Polysaccharide-23 07/03/2005, 07/04/2007, 04/18/2011   Td 06/02/2009   Zoster Recombinat (Shingrix) 04/09/2020   Zoster, Live 07/03/2006   Patient reports she has had a lot of medication changes and it has been difficult to keep up with. Patient realized that she has been taking a double dose of diltiazem as she had 2 bottles with different medication names listed.  Patient also reports her blood sugars have been much higher this past week. They have been > 200 in the mornings and she is unsure why. She has not changed any of her eating habits but has had more stress recently with her husband. He is working and vents to her with problems with work because he doesn't have anyone else to tell.   Conditions to be addressed/monitored:  Hypertension, Hyperlipidemia, Diabetes, Atrial Fibrillation, Heart Failure, Coronary Artery Disease, Anxiety and Allergic Rhinitis  Conditions addressed this visit: Diabetes, GERD  Care Plan : CCM Pharmacy Care Plan  Updates made by PViona Gilmore  [place] 01/17/2022 12:00 AM     Problem: Problem: Hypertension, Hyperlipidemia, Diabetes, Atrial Fibrillation, Heart Failure, Coronary Artery Disease, Anxiety and Allergic Rhinitis      Long-Range Goal: Patient-Specific Goal   Start Date: 11/26/2020  Expected End Date: 11/26/2021  Recent Progress: On track  Priority: High  Note:   Current Barriers:  Unable to maintain control of diabetes  Pharmacist Clinical Goal(s):  Patient will achieve adherence to monitoring guidelines and medication adherence to achieve therapeutic efficacy achieve control of diabetes as evidenced by home blood sugar readings and A1c  through collaboration with PharmD and provider.   Interventions: 1:1 collaboration with [name], WStandley Brooking MD regarding development and update of comprehensive plan of care as evidenced by provider attestation and co-signature Inter-disciplinary care team collaboration (see longitudinal plan of care) Comprehensive medication review performed; medication list updated in electronic medical record  Hypertension (BP goal <130/80) -Not ideally controlled -Current treatment: Diltiazem (Cardizem CD) 3617m 1 capsule once daily  - Appropriate, Query effective, Safe, Accessible Metoprolol tartrate 50 mg 1 tablet in the AM and 2 tablets in the PM - Appropriate, Query effective, Safe, Accessible Spironolactone 1/2 tablet once daily - Appropriate, Query effective, Safe, Accessible -Medications previously tried: indapamide, losartan -Current home readings: 124/64 (couple times a week) -Current dietary habits: did not discuss -Current exercise habits: limited due to shortness of breath -Denies hypotensive/hypertensive symptoms -Educated on Importance of home blood pressure monitoring; Proper BP monitoring technique; -Counseled to monitor BP at home weekly, document, and provide log at future appointments -Counseled on diet and exercise extensively Recommended to continue current  medication  Hyperlipidemia: (LDL goal < 55) -Uncontrolled -Current treatment: Atorvastatin 4016m1 tablet once daily - Appropriate, Query effective, Safe, Accessible -Medications previously tried: none  -Current dietary patterns: did not discuss -Current exercise habits: limited due to shortness of breath -Educated on Cholesterol goals;  Importance of limiting foods high in cholesterol; -Counseled on diet and exercise extensively Recommended to continue current medication  CAD (Goal: prevent heart attacks and strokes) -Controlled -Current treatment  Atorvastatin 28m, 1 tablet daily  - Appropriate, Effective, Safe, Accessible Nitroglycerin 0.457mSL tablet, place 1 tablet under tongue every five minutes for 3 doses as needed for chest pain - Appropriate, Effective, Safe, Accessible -Medications previously tried: none  -Recommended to continue current medication   Diabetes (A1c goal <7%) -Not ideally controlled -Current medications: Metformin 1000 mg XR 1 tablet twice daily - Appropriate, Query effective, Safe, Accessible Jardiance 25 mg 1 tablet daily - Appropriate, Query effective, Safe, Accessible -Medications previously tried: FaIrancost)  -Current home glucose readings fasting glucose: 205, 212, 202 (> 200 for the last week) post prandial glucose: n/a -Denies hypoglycemic/hyperglycemic symptoms -Current meal patterns:  breakfast: premier protein shake with fruit lunch: salad dinner: steak, salad and bread snacks: did not discuss drinks: did not discuss -Current exercise: limited with shortness of breath -Educated on A1c and blood sugar goals; Benefits of routine self-monitoring of blood sugar; Carbohydrate counting and/or plate method -Counseled to check feet daily and get yearly eye exams -Counseled on diet and exercise extensively Recommended Dexcom CGM and Tresiba 10 units daily.  Atrial Fibrillation (Goal: prevent stroke and major  bleeding) -Controlled -CHADSVASC: 6 -Current treatment: Rate control: Diltiazem (Cardizem CD) 36054m1 capsule once daily, dofetilide 250 mcg 1 capsule twice daily - Appropriate, Effective, Safe, Accessible Anticoagulation: Eliquis 5mg40m tablet twice daily  - Appropriate, Effective, Safe, Accessible -Medications previously tried: dronedarone (patient reported retaining fluid), amiodarone, dofetilide, sotalol  -Home BP and HR readings: did not discuss  -Counseled on importance of adherence to anticoagulant exactly as prescribed; avoidance of NSAIDs due to increased bleeding risk with anticoagulants; -Recommended to continue current medication  Heart Failure (Goal: manage symptoms and prevent exacerbations) -Controlled -Last ejection fraction: 60-65% (Date: 05/20/2020) -HF type: Diastolic -NYHA Class: III (marked limitation of activity) -AHA HF Stage: C (Heart disease and symptoms present) -Current treatment: Torsemide 20mg22mtablets daily - Appropriate, Effective, Safe, Accessible Klor-con 20mEq31mtablet daily - Appropriate, Effective, Safe, Accessible Jardiance 25 mg 1 tablet daily - Appropriate, Effective, Safe, Accessible Metoprolol tartrate 50 mg 1 tablet in the AM and 2 tablets in the PM - Appropriate, Effective, Safe, Accessible Metolazone 2.5 mg 1 tablet every 4 days as needed - Appropriate, Effective, Safe, Accessible -Medications previously tried: n/a  -Current home BP/HR readings: did not discuss -Current dietary habits: limiting salt intake -Current exercise habits: limited due to shortness of breath -Educated on Importance of weighing daily; if you gain more than 3 pounds in one day or 5 pounds in one week, call cardiologist -Recommended to continue current medication  Stress/Anxiety (Goal: minimize symptoms) -Controlled -Current treatment: Duloxetine 60mg,136msule once daily - Appropriate, Effective, Safe, Accessible -Medications previously tried/failed:  fluoxetine -PHQ9: 0 -GAD7: n/a -Educated on Benefits of medication for symptom control -Recommended to continue current medication  Allergic rhinitis (Goal: minimize symptoms) -Controlled -Current treatment  Fluticasone (Flonase) 50mcg/ 36mnasal spray, 1 spray into both nostrils daily  - Appropriate, Effective, Safe, Accessible Loratadine 10mg,1 t25mt once daily - Appropriate, Effective, Safe, Accessible -Medications previously tried: none  -Recommended to continue current medication  GERD (Goal: minimize symptoms) -Controlled -Current treatment  Omeprazole 20mg, 1 c22mle twice daily - Appropriate, Effective, Safe, Accessible -Medications previously tried: none  -Recommended to continue current medication Patient is taking for stomach pain and reports it is helping now with twice daily  Bone health (Goal prevent fractures) -Controlled -Last DEXA Scan: 04/08/2014  T-Score femoral neck:  0.85  T-Score total hip: n/a  T-Score lumbar spine: n/a  T-Score forearm radius: n/a  10-year probability of major osteoporotic fracture: 5.9%  10-year probability of hip fracture: 0.2% -Patient is not a candidate for pharmacologic treatment -Current treatment  Calcium carbonate/ vitamin D, 1 tablet at bedtime Cholecalciferol (162mg)  5000 units, 1 tablet every evening -Medications previously tried: none  -Recommend [phone] units of vitamin D daily. Recommend 1200 mg of calcium daily from dietary and supplemental sources. Recommend weight-bearing and muscle strengthening exercises for building and maintaining bone density. -Recommended to continue current medication  Health Maintenance -Vaccine gaps: Shingrix -Current therapy:  APAP 5051m 2 in the AM, and 2 in the PM  Lysine 10002m1 tablet at bedtime  Multivitamin, 1 tablet once daily  Systane, 1 drop into both eyes twice daily as needed Valacyclovir 1000m67m tablet every twelve hours as needed for fever blisters/ cold sores   -Educated on Cost vs benefit of each product must be carefully weighed by individual consumer -Patient is satisfied with current therapy and denies issues -Recommended to continue current medication  Patient Goals/Self-Care Activities Patient will:  - take medications as prescribed check glucose daily, document, and provide at future appointments engage in dietary modifications by following healthy plate model  Follow Up Plan: The care management team will reach out to the patient again over the next 14 days.         Medication Assistance: None required.  Patient affirms current coverage meets needs.  Compliance/Adherence/Medication fill history: Care Gaps: Shingrix - 2nd dose, colonoscopy, eye exam, urine microalbumin, tetanus, foot exam BP-154/66 ( 01/16/22) A1c - 7.7% (12/26/21)  Star-Rating Drugs: Metformin (Glucophage) 500 mg - Last filled 01/12/22 90 DS at [facility] Empagliflozin (Jardiance) 25 mg - Last filled 11/17/21 100 DS at [facility] Atorvastatin (Lipitor) 40 mg - Last filled 12/07/21 100 DS at [facility]  Patient's preferred pharmacy is:  [id number] [place] [facility] [id number]: [phone] [place]- [place] Care[facility] Sites [id number]: [phone] [place]- [place] LAWNDLocust [place]orkNL[id number]: [phone] ([facility] ) - [place]- [place] [address]646[address]e: [phone] Pont[facility] [address]5038e: [phone] pill box? Yes - patient uses weekly pill box for 2 weeks Pt endorses  100% compliance  We discussed: Benefits of medication synchronization, packaging and delivery as well as enhanced pharmacist oversight with [facility]. Patient decided to: Continue current medication management strategy  Care Plan and Follow Up Patient Decision:  Patient agrees to Care Plan and Follow-up.  Plan: The care management team will reach out to the patient again over the next 14 days.  MadelJeni SallesrmD BCACPKindred Hospital The Heightsical Pharmacist [place]

## 2022-01-16 NOTE — Telephone Encounter (Signed)
Patient requesting to switch from Dr. Acie Fredrickson to Dr. Johney Frame.

## 2022-01-16 NOTE — Telephone Encounter (Signed)
Called patient back but unable to reach. Left a voicemail requesting a call back.  Patient is seeing PCP tomorrow - will try to see if she is available to speak for a few minutes after her PCP appointment if she does not call back before then.

## 2022-01-16 NOTE — Patient Instructions (Signed)
Labs done today. We will contact you only if your labs are abnormal.  No medication changes were made. Please continue all current medications as prescribed.  You have been referred to [facility] That office will contact you to schedule an appointment.   If you have any questions or concerns before your next appointment please send [place] a message through [place] or call our office at [phone].    TO LEAVE A MESSAGE FOR THE NURSE SELECT OPTION 2, PLEASE LEAVE A MESSAGE INCLUDING: YOUR NAME DATE OF BIRTH CALL BACK NUMBER REASON FOR CALL**this is important as we prioritize the call backs  YOU WILL RECEIVE A CALL BACK THE SAME DAY AS LONG AS YOU CALL BEFORE 4:00 PM

## 2022-01-16 NOTE — Telephone Encounter (Signed)
Called to confirm Heart & Vascular Transitions of Care appointment at 10 am on 01/16/22. Patient reminded to bring all medications and pill box organizer with them. Confirmed patient has transportation. Gave directions, instructed to utilize [place] parking.  Confirmed appointment prior to ending call.    Earnestine Leys, BSN, Clinical cytogeneticist Only

## 2022-01-17 ENCOUNTER — Ambulatory Visit (INDEPENDENT_AMBULATORY_CARE_PROVIDER_SITE_OTHER): Payer: Medicare Other | Admitting: Pharmacist

## 2022-01-17 ENCOUNTER — Ambulatory Visit: Payer: Medicare Other | Admitting: Internal Medicine

## 2022-01-17 DIAGNOSIS — E1165 Type 2 diabetes mellitus with hyperglycemia: Secondary | ICD-10-CM

## 2022-01-17 DIAGNOSIS — I10 Essential (primary) hypertension: Secondary | ICD-10-CM

## 2022-01-17 NOTE — Patient Instructions (Signed)
Hi [name],  It was great to catch up with you again!  Please reach out to me if you have any questions or need anything before our follow up!  Best, Maddie  [name], PharmD, [facility]   Visit Information   Goals Addressed   None    Patient Care Plan: CCM Pharmacy Care Plan     Problem Identified: Problem: Hypertension, Hyperlipidemia, Diabetes, Atrial Fibrillation, Heart Failure, Coronary Artery Disease, Anxiety and Allergic Rhinitis      Long-Range Goal: Patient-Specific Goal   Start Date: 11/26/2020  Expected End Date: 11/26/2021  Recent Progress: On track  Priority: High  Note:   Current Barriers:  Unable to maintain control of diabetes  Pharmacist Clinical Goal(s):  Patient will achieve adherence to monitoring guidelines and medication adherence to achieve therapeutic efficacy achieve control of diabetes as evidenced by home blood sugar readings and A1c  through collaboration with PharmD and provider.   Interventions: 1:1 collaboration with [name], [name], MD regarding development and update of comprehensive plan of care as evidenced by provider attestation and co-signature Inter-disciplinary care team collaboration (see longitudinal plan of care) Comprehensive medication review performed; medication list updated in electronic medical record  Hypertension (BP goal <130/80) -Not ideally controlled -Current treatment: Diltiazem (Cardizem CD) '360mg'$ , 1 capsule once daily  - Appropriate, Query effective, Safe, Accessible Metoprolol tartrate 50 mg 1 tablet in the AM and 2 tablets in the PM - Appropriate, Query effective, Safe, Accessible Spironolactone 1/2 tablet once daily - Appropriate, Query effective, Safe, Accessible -Medications previously tried: indapamide, losartan -Current home readings: 124/64 (couple times a week) -Current dietary habits: did not discuss -Current exercise habits: limited  due to shortness of breath -Denies hypotensive/hypertensive symptoms -Educated on Importance of home blood pressure monitoring; Proper BP monitoring technique; -Counseled to monitor BP at home weekly, document, and provide log at future appointments -Counseled on diet and exercise extensively Recommended to continue current medication  Hyperlipidemia: (LDL goal < 55) -Uncontrolled -Current treatment: Atorvastatin '40mg'$ , 1 tablet once daily - Appropriate, Query effective, Safe, Accessible -Medications previously tried: none  -Current dietary patterns: did not discuss -Current exercise habits: limited due to shortness of breath -Educated on Cholesterol goals;  Importance of limiting foods high in cholesterol; -Counseled on diet and exercise extensively Recommended to continue current medication  CAD (Goal: prevent heart attacks and strokes) -Controlled -Current treatment  Atorvastatin '40mg'$ , 1 tablet daily  - Appropriate, Effective, Safe, Accessible Nitroglycerin 0.'4mg'$  SL tablet, place 1 tablet under tongue every five minutes for 3 doses as needed for chest pain - Appropriate, Effective, Safe, Accessible -Medications previously tried: none  -Recommended to continue current medication   Diabetes (A1c goal <7%) -Not ideally controlled -Current medications: Metformin 1000 mg XR 1 tablet twice daily - Appropriate, Query effective, Safe, Accessible Jardiance 25 mg 1 tablet daily - Appropriate, Query effective, Safe, Accessible -Medications previously tried: [place] (cost)  -Current home glucose readings fasting glucose: 205, 212, 202 (> 200 for the last week) post prandial glucose: n/a -Denies hypoglycemic/hyperglycemic symptoms -Current meal patterns:  breakfast: premier protein shake with fruit lunch: salad dinner: steak, salad and bread snacks: did not discuss drinks: did not discuss -Current exercise: limited with shortness of breath -Educated on A1c and blood sugar  goals; Benefits of routine self-monitoring of blood sugar; Carbohydrate counting and/or plate method -Counseled to check feet daily and get yearly eye exams -Counseled on diet and exercise extensively Recommended Dexcom CGM and Tresiba 10 units  daily.  Atrial Fibrillation (Goal: prevent stroke and major bleeding) -Controlled -CHADSVASC: 6 -Current treatment: Rate control: Diltiazem (Cardizem CD) '360mg'$ , 1 capsule once daily, dofetilide 250 mcg 1 capsule twice daily - Appropriate, Effective, Safe, Accessible Anticoagulation: Eliquis '5mg'$ , 1 tablet twice daily  - Appropriate, Effective, Safe, Accessible -Medications previously tried: dronedarone (patient reported retaining fluid), amiodarone, dofetilide, sotalol  -Home BP and HR readings: did not discuss  -Counseled on importance of adherence to anticoagulant exactly as prescribed; avoidance of NSAIDs due to increased bleeding risk with anticoagulants; -Recommended to continue current medication  Heart Failure (Goal: manage symptoms and prevent exacerbations) -Controlled -Last ejection fraction: 60-65% (Date: 05/20/2020) -HF type: Diastolic -NYHA Class: III (marked limitation of activity) -AHA HF Stage: C (Heart disease and symptoms present) -Current treatment: Torsemide '20mg'$ , 3 tablets daily - Appropriate, Effective, Safe, Accessible Klor-con 23mq, 1 tablet daily - Appropriate, Effective, Safe, Accessible Jardiance 25 mg 1 tablet daily - Appropriate, Effective, Safe, Accessible Metoprolol tartrate 50 mg 1 tablet in the AM and 2 tablets in the PM - Appropriate, Effective, Safe, Accessible Metolazone 2.5 mg 1 tablet every 4 days as needed - Appropriate, Effective, Safe, Accessible -Medications previously tried: n/a  -Current home BP/HR readings: did not discuss -Current dietary habits: limiting salt intake -Current exercise habits: limited due to shortness of breath -Educated on Importance of weighing daily; if you gain more than 3  pounds in one day or 5 pounds in one week, call cardiologist -Recommended to continue current medication  Stress/Anxiety (Goal: minimize symptoms) -Controlled -Current treatment: Duloxetine '60mg'$ ,1 capsule once daily - Appropriate, Effective, Safe, Accessible -Medications previously tried/failed: fluoxetine -PHQ9: 0 -GAD7: n/a -Educated on Benefits of medication for symptom control -Recommended to continue current medication  Allergic rhinitis (Goal: minimize symptoms) -Controlled -Current treatment  Fluticasone (Flonase) 550m/ act nasal spray, 1 spray into both nostrils daily  - Appropriate, Effective, Safe, Accessible Loratadine '10mg'$ ,1 tablet once daily - Appropriate, Effective, Safe, Accessible -Medications previously tried: none  -Recommended to continue current medication  GERD (Goal: minimize symptoms) -Controlled -Current treatment  Omeprazole '20mg'$ , 1 capsule twice daily - Appropriate, Effective, Safe, Accessible -Medications previously tried: none  -Recommended to continue current medication Patient is taking for stomach pain and reports it is helping now with twice daily  Bone health (Goal prevent fractures) -Controlled -Last DEXA Scan: 04/08/2014  T-Score femoral neck: 0.85  T-Score total hip: n/a  T-Score lumbar spine: n/a  T-Score forearm radius: n/a  10-year probability of major osteoporotic fracture: 5.9%  10-year probability of hip fracture: 0.2% -Patient is not a candidate for pharmacologic treatment -Current treatment  Calcium carbonate/ vitamin D, 1 tablet at bedtime Cholecalciferol (1253m  5000 units, 1 tablet every evening -Medications previously tried: none  -Recommend [phone] units of vitamin D daily. Recommend 1200 mg of calcium daily from dietary and supplemental sources. Recommend weight-bearing and muscle strengthening exercises for building and maintaining bone density. -Recommended to continue current medication  Health Maintenance -Vaccine  gaps: Shingrix -Current therapy:  APAP '500mg'$ , 2 in the AM, and 2 in the PM  Lysine '1000mg'$ , 1 tablet at bedtime  Multivitamin, 1 tablet once daily  Systane, 1 drop into both eyes twice daily as needed Valacyclovir '1000mg'$ , 2 tablet every twelve hours as needed for fever blisters/ cold sores  -Educated on Cost vs benefit of each product must be carefully weighed by individual consumer -Patient is satisfied with current therapy and denies issues -Recommended to continue current medication  Patient Goals/Self-Care Activities Patient will:  - take  medications as prescribed check glucose daily, document, and provide at future appointments engage in dietary modifications by following healthy plate model  Follow Up Plan: The care management team will reach out to the patient again over the next 14 days.         Patient verbalizes understanding of instructions and care plan provided today and agrees to view in [place]. Active MyChart status and patient understanding of how to access instructions and care plan via MyChart confirmed with patient.    The pharmacy team will reach out to the patient again over the next 14 days.   Viona Gilmore, [facility]

## 2022-01-20 ENCOUNTER — Telehealth: Payer: Self-pay | Admitting: Primary Care

## 2022-01-20 LAB — MYOMARKER 3 PLUS PROFILE (RDL)

## 2022-01-20 LAB — ANTI-JO-1 AB (RDL)

## 2022-01-20 NOTE — Telephone Encounter (Signed)
Patient of yours with ILD. She has abnormal groundglass opacities on HRCT from 12/13/21. This is not significantly changed from prior scans over the past couple years. 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   Hypersensitive pneumonitis panel just came back positive for A. Fumigatus. Do you want me to refer to ID?

## 2022-01-23 NOTE — Telephone Encounter (Signed)
Suspect HP. No need to send to ID . AGree with repeat HRCT. She should get a ILD questionnaire to be done before seeing me

## 2022-01-23 NOTE — Telephone Encounter (Signed)
Can we make sure patient has repeat HRCT before October and complete ILD questionnaire before seeing MR in October

## 2022-01-25 ENCOUNTER — Other Ambulatory Visit: Payer: Self-pay | Admitting: Cardiology

## 2022-01-25 DIAGNOSIS — M5136 Other intervertebral disc degeneration, lumbar region: Secondary | ICD-10-CM | POA: Diagnosis not present

## 2022-01-25 DIAGNOSIS — M9903 Segmental and somatic dysfunction of lumbar region: Secondary | ICD-10-CM | POA: Diagnosis not present

## 2022-01-25 DIAGNOSIS — M50322 Other cervical disc degeneration at C5-C6 level: Secondary | ICD-10-CM | POA: Diagnosis not present

## 2022-01-25 DIAGNOSIS — I480 Paroxysmal atrial fibrillation: Secondary | ICD-10-CM

## 2022-01-25 DIAGNOSIS — M9901 Segmental and somatic dysfunction of cervical region: Secondary | ICD-10-CM | POA: Diagnosis not present

## 2022-01-25 NOTE — Telephone Encounter (Signed)
Prescription refill request for Eliquis received. Indication:Afib  Last office visit: 12/26/21 [name], [place])  Scr: 1.42 (01/16/22) Age: 76 Weight: 110.1kg  Appropriate dose and refill sent to requested pharmacy.

## 2022-01-26 DIAGNOSIS — M50322 Other cervical disc degeneration at C5-C6 level: Secondary | ICD-10-CM | POA: Diagnosis not present

## 2022-01-26 DIAGNOSIS — M9901 Segmental and somatic dysfunction of cervical region: Secondary | ICD-10-CM | POA: Diagnosis not present

## 2022-01-26 DIAGNOSIS — M9903 Segmental and somatic dysfunction of lumbar region: Secondary | ICD-10-CM | POA: Diagnosis not present

## 2022-01-26 DIAGNOSIS — M5136 Other intervertebral disc degeneration, lumbar region: Secondary | ICD-10-CM | POA: Diagnosis not present

## 2022-01-30 DIAGNOSIS — I10 Essential (primary) hypertension: Secondary | ICD-10-CM | POA: Diagnosis not present

## 2022-01-30 DIAGNOSIS — E1165 Type 2 diabetes mellitus with hyperglycemia: Secondary | ICD-10-CM | POA: Diagnosis not present

## 2022-01-30 DIAGNOSIS — M50322 Other cervical disc degeneration at C5-C6 level: Secondary | ICD-10-CM | POA: Diagnosis not present

## 2022-01-30 DIAGNOSIS — M9903 Segmental and somatic dysfunction of lumbar region: Secondary | ICD-10-CM | POA: Diagnosis not present

## 2022-01-30 DIAGNOSIS — M9901 Segmental and somatic dysfunction of cervical region: Secondary | ICD-10-CM | POA: Diagnosis not present

## 2022-01-30 DIAGNOSIS — M5136 Other intervertebral disc degeneration, lumbar region: Secondary | ICD-10-CM | POA: Diagnosis not present

## 2022-01-31 ENCOUNTER — Telehealth: Payer: Self-pay | Admitting: Pharmacist

## 2022-01-31 DIAGNOSIS — M5136 Other intervertebral disc degeneration, lumbar region: Secondary | ICD-10-CM | POA: Diagnosis not present

## 2022-01-31 DIAGNOSIS — M9903 Segmental and somatic dysfunction of lumbar region: Secondary | ICD-10-CM | POA: Diagnosis not present

## 2022-01-31 DIAGNOSIS — M9901 Segmental and somatic dysfunction of cervical region: Secondary | ICD-10-CM | POA: Diagnosis not present

## 2022-01-31 DIAGNOSIS — M50322 Other cervical disc degeneration at C5-C6 level: Secondary | ICD-10-CM | POA: Diagnosis not present

## 2022-01-31 MED ORDER — DEXCOM G7 SENSOR MISC: 3 refills | Status: DC

## 2022-01-31 MED ORDER — DEXCOM G7 RECEIVER DEVI: 3 refills | Status: DC

## 2022-01-31 MED ORDER — INSULIN DEGLUDEC 100 UNIT/ML ~~LOC~~ SOPN: 10.0000 [IU] | PEN_INJECTOR | Freq: Every day | SUBCUTANEOUS | 3 refills | Status: DC

## 2022-01-31 NOTE — Telephone Encounter (Signed)
[name] and dexcom rx sent to pharmacy.

## 2022-01-31 NOTE — Telephone Encounter (Signed)
Patient called back about trying insulin and Dexcom for CGM monitoring. Patient does wish to do this and move forward with it. Will route to PCP to send in the prescriptions and will plan to follow up with patient in office to go over proper use.

## 2022-02-01 NOTE — Telephone Encounter (Signed)
Called and spoke with pt letting her know the info per BW and MR and she verbalized understanding. ILD questionnaire packet mailed to pt. Nothing further needed.

## 2022-02-02 DIAGNOSIS — M9901 Segmental and somatic dysfunction of cervical region: Secondary | ICD-10-CM | POA: Diagnosis not present

## 2022-02-02 DIAGNOSIS — M5136 Other intervertebral disc degeneration, lumbar region: Secondary | ICD-10-CM | POA: Diagnosis not present

## 2022-02-02 DIAGNOSIS — M50322 Other cervical disc degeneration at C5-C6 level: Secondary | ICD-10-CM | POA: Diagnosis not present

## 2022-02-02 DIAGNOSIS — M9903 Segmental and somatic dysfunction of lumbar region: Secondary | ICD-10-CM | POA: Diagnosis not present

## 2022-02-04 DIAGNOSIS — J9601 Acute respiratory failure with hypoxia: Secondary | ICD-10-CM | POA: Diagnosis not present

## 2022-02-04 DIAGNOSIS — J8410 Pulmonary fibrosis, unspecified: Secondary | ICD-10-CM | POA: Diagnosis not present

## 2022-02-04 DIAGNOSIS — J849 Interstitial pulmonary disease, unspecified: Secondary | ICD-10-CM | POA: Diagnosis not present

## 2022-02-06 DIAGNOSIS — M5136 Other intervertebral disc degeneration, lumbar region: Secondary | ICD-10-CM | POA: Diagnosis not present

## 2022-02-06 DIAGNOSIS — M9903 Segmental and somatic dysfunction of lumbar region: Secondary | ICD-10-CM | POA: Diagnosis not present

## 2022-02-06 DIAGNOSIS — M50322 Other cervical disc degeneration at C5-C6 level: Secondary | ICD-10-CM | POA: Diagnosis not present

## 2022-02-06 DIAGNOSIS — M9901 Segmental and somatic dysfunction of cervical region: Secondary | ICD-10-CM | POA: Diagnosis not present

## 2022-02-06 NOTE — Telephone Encounter (Signed)
Called pharmacy to make sure Dexcom and Tresiba prescriptions were processed. Cor[facility]. Patient should be able to pick up all 3 tomorrow from the pharmacy. Scheduled in office visit to go over proper use tomorrow afternoon.

## 2022-02-07 ENCOUNTER — Ambulatory Visit (INDEPENDENT_AMBULATORY_CARE_PROVIDER_SITE_OTHER): Payer: Medicare Other | Admitting: Pharmacist

## 2022-02-07 ENCOUNTER — Telehealth: Payer: Self-pay

## 2022-02-07 DIAGNOSIS — M9903 Segmental and somatic dysfunction of lumbar region: Secondary | ICD-10-CM | POA: Diagnosis not present

## 2022-02-07 DIAGNOSIS — M9901 Segmental and somatic dysfunction of cervical region: Secondary | ICD-10-CM | POA: Diagnosis not present

## 2022-02-07 DIAGNOSIS — M5136 Other intervertebral disc degeneration, lumbar region: Secondary | ICD-10-CM | POA: Diagnosis not present

## 2022-02-07 DIAGNOSIS — E1165 Type 2 diabetes mellitus with hyperglycemia: Secondary | ICD-10-CM

## 2022-02-07 DIAGNOSIS — M50322 Other cervical disc degeneration at C5-C6 level: Secondary | ICD-10-CM | POA: Diagnosis not present

## 2022-02-07 DIAGNOSIS — I10 Essential (primary) hypertension: Secondary | ICD-10-CM

## 2022-02-07 NOTE — Progress Notes (Unsigned)
Chronic Care Management Pharmacy Note  02/08/2022 Name:  [name] MRN:  [id number] DOB:  17-Aug-1945  Summary: A1c not at goal of < 7% Pt brought Dexcom and Tresiba supplies   Recommendations/Changes made from today's visit: -Demonstrated proper use and administration for Dexcom and Tresiba -Recommended BP monitoring at home and only taking 1 diltiazem daily -Recommended increasing Tresiba by 2 units every 2-3 days to target fasting BGs < 130 -Transition to adherence packaging for medications -Requested pen needles prescription   Plan: Follow up in 1 month  Subjective: [name] is an 76 y.o. year old female who is a primary patient of [name], [name], MD.  The CCM team was consulted for assistance with disease management and care coordination needs.    Engaged with patient by telephone for follow up visit in response to provider referral for pharmacy case management and/or care coordination services.   Consent to Services:  The patient was given information about Chronic Care Management services, agreed to services, and gave verbal consent prior to initiation of services.  Please see initial visit note for detailed documentation.   Patient Care Team: [name], [name], MD as PCP - General (Internal Medicine) Constance Haw, MD as PCP - Electrophysiology (Cardiology) Nahser, Wonda Cheng, MD as PCP - Cardiology (Cardiology) Gaynelle Arabian, MD as Consulting Physician (Orthopedic Surgery) Brand Males, MD as Consulting Physician (Pulmonary Disease) Viona Gilmore, [facility] as Pharmacist (Pharmacist)  Recent office visits: 01/12/22 Rolene Arbour, LPN: Patient presented for AWV.  12/26/21 [name], MD: Patient presented for hospital follow up.  08/23/21 [name], MD: Patient presented for diabetes follow- up and abdominal pain. A1c slightly decreased to 7.7%. Plan for CGM start and reached out to [facility] team for support.  04/20/21 [name], [name] - Patient presented  for queasiness and other concerns. No medication changes. A1c increased to 8.2%.  Recent consult visits: 01/16/22 Patient presented for Heart & Vascular Transitions of Care appointment.   01/04/22 Patient presented for right heart cath.  12/29/21 Geraldo Pitter, NP (pulmonary): Patient presented for ILD and hospital follow up. Follow up in 3 months.  12/26/21 Marlyce Huge, PA-C (CHF clinic): Patient presented for hospital follow up. Decreased potassium to 20 mEq per day and started spironolactone 12.5 mg daily.   10/06/21 Tommye Standard, PA-C (cardiology): Patient presented for Afib follow up. Changed metoprolol to 50 mg in the AM and 100 mg in the PM. Follow up in 6 months.  Hospital visits: 6/10-6/16/23 Patient admitted to [facility] for CHF exacerbation. Prescribed metolazone for PRN use.  Objective:  Lab Results  Component Value Date   CREATININE 1.42 (H) 01/16/2022   BUN 18 01/16/2022   GFR 38.69 (L) 12/28/2021   GFRNONAA 39 (L) 01/16/2022   GFRAA 40 (L) 07/28/2020   NA 138 01/16/2022   K 4.5 01/16/2022   CALCIUM 9.6 01/16/2022   CO2 32 01/16/2022   GLUCOSE 165 (H) 01/16/2022    Lab Results  Component Value Date/Time   HGBA1C 7.7 (A) 12/26/2021 03:38 PM   HGBA1C 8.0 (H) 08/23/2021 09:35 AM   HGBA1C 8.2 (H) 04/20/2021 11:30 AM   GFR 38.69 (L) 12/28/2021 09:04 AM   GFR 42.98 (L) 08/23/2021 09:35 AM   MICROALBUR <0.7 01/23/2017 10:51 AM    Last diabetic Eye exam:  Lab Results  Component Value Date/Time   HMDIABEYEEXA No Retinopathy 07/05/2021 03:22 PM    Last diabetic Foot exam: No results found for: "HMDIABFOOTEX"  Lab Results  Component Value Date   CHOL 147 08/23/2021   HDL 47.20 08/23/2021   LDLCALC 68 04/10/2019   LDLDIRECT 76.0 08/23/2021   TRIG 206.0 (H) 08/23/2021   CHOLHDL 3 08/23/2021       Latest Ref Rng & Units 12/26/2021    9:56 AM 12/11/2021    3:19 AM 12/10/2021   12:22 AM  Hepatic Function  Total Protein 6.5 - 8.1 g/dL 7.5  6.9   6.9   Albumin 3.5 - 5.0 g/dL 3.9  3.4  3.6   AST 15 - 41 U/L 36  28  73   ALT 0 - 44 U/L 46  47  65   Alk Phosphatase 38 - 126 U/L 83  84  88   Total Bilirubin 0.3 - 1.2 mg/dL 0.7  1.1  1.1     Lab Results  Component Value Date/Time   TSH 4.61 08/23/2021 09:35 AM   TSH 4.37 04/20/2021 11:30 AM   FREET4 0.86 04/20/2021 11:30 AM   FREET4 0.83 05/06/2018 09:23 AM       Latest Ref Rng & Units 01/04/2022    8:11 AM 12/16/2021    3:09 AM 12/15/2021    4:03 AM  CBC  WBC 4.0 - 10.5 K/uL  8.0  8.4   Hemoglobin 12.0 - 15.0 g/dL 12.0 - 15.0 g/dL 14.3    14.6  13.1  13.4   Hematocrit 36.0 - 46.0 % 36.0 - 46.0 % 42.0    43.0  40.3  41.3   Platelets 150 - 400 K/uL  255  253     Lab Results  Component Value Date/Time   VD25OH 79.26 12/28/2021 09:04 AM   VD25OH 32.83 01/23/2017 10:51 AM    Clinical ASCVD: No  The 10-year ASCVD risk score (Arnett DK, et al., 2019) is: 51.6%   Values used to calculate the score:     Age: 13 years     Sex: Female     Is Non-Hispanic African American: No     Diabetic: Yes     Tobacco smoker: No     Systolic Blood Pressure: 350 mmHg     Is BP treated: Yes     HDL Cholesterol: 47.2 mg/dL     Total Cholesterol: 147 mg/dL       01/12/2022    9:50 AM 12/26/2021    2:46 PM 11/30/2020    8:50 AM  Depression screen PHQ 2/9  Decreased Interest 0 1 0  Down, Depressed, Hopeless 0 1 1  PHQ - 2 Score 0 2 1  Altered sleeping 0 2   Tired, decreased energy 0 1   Change in appetite 0 2   Feeling bad or failure about yourself  0 0   Trouble concentrating 0 1   Moving slowly or fidgety/restless 0 0   Suicidal thoughts 0 0   PHQ-9 Score 0 8   Difficult doing work/chores Not difficult at all Somewhat difficult      CHA2DS2/VAS Stroke Risk Points  Current as of about an hour ago     6 >= 2 Points: High Risk  1 - 1.99 Points: Medium Risk  0 Points: Low Risk    Last Change: N/A      Details    This score determines the patient's risk of having a stroke if  the  patient has atrial fibrillation.       Points Metrics  1 Has Congestive Heart Failure:  Yes  Current as of about an hour ago  1 Has Vascular Disease:  Yes    Current as of about an hour ago  1 Has Hypertension:  Yes    Current as of about an hour ago  1 Age:  25    Current as of about an hour ago  1 Has Diabetes:  Yes    Current as of about an hour ago  0 Had Stroke:  No  Had TIA:  No  Had Thromboembolism:  No    Current as of about an hour ago  1 Female:  Yes    Current as of about an hour ago     Social History   Tobacco Use  Smoking Status Former   Packs/day: 1.00   Years: 5.00   Total pack years: 5.00   Types: Cigarettes   Start date: 05/11/1978   Quit date: 07/03/1982   Years since quitting: 39.6  Smokeless Tobacco Never   BP Readings from Last 3 Encounters:  01/16/22 (!) 154/66  01/12/22 120/62  01/04/22 (!) 110/45   Pulse Readings from Last 3 Encounters:  01/16/22 66  01/12/22 61  01/04/22 65   Wt Readings from Last 3 Encounters:  01/16/22 242 lb 12.8 oz (110.1 kg)  01/12/22 240 lb (108.9 kg)  01/04/22 238 lb (108 kg)   BMI Readings from Last 3 Encounters:  01/16/22 38.60 kg/m  01/12/22 38.16 kg/m  01/04/22 37.84 kg/m    Assessment/Interventions: Review of patient past medical history, allergies, medications, health status, including review of consultants reports, laboratory and other test data, was performed as part of comprehensive evaluation and provision of chronic care management services.   SDOH:  (Social Determinants of Health) assessments and interventions performed: Yes SDOH Interventions    Flowsheet Row Most Recent Value  SDOH Interventions   Financial Strain Interventions Intervention Not Indicated       SDOH Screenings   Alcohol Screen: Low Risk  (01/12/2022)   Alcohol Screen    Last Alcohol Screening Score (AUDIT): 1  Depression (PHQ2-9): Low Risk  (01/12/2022)   Depression (PHQ2-9)    PHQ-2 Score: 0  Recent Concern:  Depression (PHQ2-9) - Medium Risk (12/26/2021)   Depression (PHQ2-9)    PHQ-2 Score: 8  Financial Resource Strain: Low Risk  (02/08/2022)   Overall Financial Resource Strain (CARDIA)    Difficulty of Paying Living Expenses: Not very hard  Food Insecurity: No Food Insecurity (01/12/2022)   Hunger Vital Sign    Worried About Running Out of Food in the Last Year: Never true    [place] in the Last Year: Never true  Housing: Low Risk  (01/12/2022)   Housing    Last Housing Risk Score: 0  Physical Activity: Inactive (01/12/2022)   Exercise Vital Sign    Days of Exercise per Week: 0 days    Minutes of Exercise per Session: 0 min  Social Connections: Socially Integrated (01/12/2022)   Social Connection and Isolation Panel [NHANES]    Frequency of Communication with Friends and Family: More than three times a week    Frequency of Social Gatherings with Friends and Family: More than three times a week    Attends Religious Services: More than 4 times per year    Active Member of [employer] or Organizations: Yes    Attends Archivist Meetings: More than 4 times per year    Marital Status: Married  Stress: No Stress Concern Present (01/12/2022)   [employer] of  Occupational Health - Occupational Stress Questionnaire    Feeling of Stress : Not at all  Tobacco Use: Medium Risk (01/16/2022)   Patient History    Smoking Tobacco Use: Former    Smokeless Tobacco Use: Never    Passive Exposure: Not on file  Transportation Needs: No Transportation Needs (01/12/2022)   PRAPARE - Hydrologist (Medical): No    Lack of Transportation (Non-Medical): No     CCM Care Plan  Allergies  Allergen Reactions   Hydrocodone-Guaifenesin Other (See Comments)    Confusion/ memory loss   Adhesive [Tape] Rash and Other (See Comments)    Allergic to defibrillation pads.   Pedi-Pre Tape Spray [Wound Dressing Adhesive] Other (See Comments) and Rash    Allergic to  defibrillation pads.    Medications Reviewed Today     Reviewed by Viona Gilmore, [facility] (Pharmacist) on 01/17/22 at [facility] Status: <None>   Medication Order Taking? Sig Documenting Provider Last Dose Status Informant  apixaban (ELIQUIS) 5 MG TABS tablet [id number]  Take 1 tablet (5 mg total) by mouth 2 (two) times daily. Constance Haw, MD  Active Self  atorvastatin (LIPITOR) 40 MG tablet [id number]  TAKE 1 TABLET BY MOUTH DAILY Camnitz, Ocie Doyne, MD  Active Self  Continuous Blood Gluc Receiver (FREESTYLE LIBRE 14 DAY READER) DEVI [id number]  Use   14 days for diabetes control.  Patient not taking: Reported on 12/29/2021   [name], MD  Active   diltiazem (CARDIZEM LA) 360 MG 24 hr tablet [id number]  TAKE 1 TABLET BY MOUTH DAILY Camnitz, Will Hassell Done, MD  Active   dofetilide (TIKOSYN) 250 MCG capsule [id number]  TAKE ONE CAPSULE BY MOUTH TWICE A DAY Camnitz, Will Hassell Done, MD  Active Self  DULoxetine (CYMBALTA) 60 MG capsule [id number]  TAKE 1 CAPSULE BY MOUTH EVERY DAY [name], [name], MD  Active Self  fluticasone (FLONASE) 50 MCG/ACT nasal spray [id number]  Place 1 spray into both nostrils daily as needed for allergies. [provider]  Active Self  JARDIANCE 25 MG TABS tablet [id number]  TAKE 1 TABLET (25 MG TOTAL) BY MOUTH DAILY. [name], [name], MD  Active   loratadine (CLARITIN) 10 MG tablet [id number]  TAKE 1 TABLET BY MOUTH EVERY DAY  Patient taking differently: Take 10 mg by mouth daily as needed for allergies.   [name], [name], MD  Active Self  Lysine 1000 MG TABS [id number] Yes Take 1,000 mg by mouth at bedtime.  [provider] Taking Active Self  metFORMIN (GLUCOPHAGE) 1000 MG tablet [id number]  Take 1 tablet (1,000 mg total) by mouth 2 (two) times daily. [name], [name], MD  Active Self  metolazone (ZAROXOLYN) 2.5 MG tablet [id number]  TAKE 1 TABLET BY MOUTH EVERY 4  DAYS AS NEEDED FOR SWELLING  PLEASE KEEP UPCOMING APPOINTMENT IN JAN, 2023 FOR  FURTHER REFILLS Camnitz, Will Hassell Done, MD  Active Self  metoprolol tartrate (LOPRESSOR) 50 MG tablet [id number]  Take 50 mg by mouth 2 (two) times daily. 50 mg in the AM and 100 mg in the PM [provider]  Active   Multiple Vitamin (MULTIVITAMIN WITH MINERALS) TABS tablet [id number] Yes Take 1 tablet by mouth daily. Centrum [provider] Taking Active Self  nitroGLYCERIN (NITROSTAT) 0.4 MG SL tablet [id number]  Place 1 tablet (0.4 mg total) under the tongue every 5 (five) minutes x 3 doses as needed for chest pain. PLEASE CALL [phone] AND SCHEDULE AN  OVERDUE APPT WITH DR Acie Fredrickson FOR FURTHER REFILLS TO BE GRANTED (2ND ATTEMPT) Nahser, Wonda Cheng, MD  Active Self  NON FORMULARY [id number]  CPAP at bedtime [provider]  Active Self  nystatin-triamcinolone (MYCOLOG II) cream [id number]  Apply 1 application topically 2 (two) times daily as needed.  Patient taking differently: Apply 1 application  topically 2 (two) times daily as needed (rash).   [name], [name], MD  Active Self  omeprazole (PRILOSEC) 20 MG capsule [id number]  Take 20 mg by mouth 2 (two) times daily. [provider]  Active Self  Polyethyl Glycol-Propyl Glycol (SYSTANE) 0.4-0.3 % SOLN [id number] Yes Place 1 drop into both eyes at bedtime. [provider] Taking Active Self  potassium chloride SA (KLOR-CON M) 20 MEQ tablet [id number]  Take 1 tablet (20 mEq total) by mouth daily. Joette Catching, PA-C  Active   spironolactone (ALDACTONE) 25 MG tablet [id number]  Take 0.5 tablets (12.5 mg total) by mouth daily. Joette Catching, PA-C  Active   torsemide [facility]) 20 MG tablet [id number]  Take 3 tablets (60 mg total) by mouth daily. Larey Dresser, MD  Active   valACYclovir (VALTREX) 1000 MG tablet [id number]  TAKE 2 TABLETS BY MOUTH EVERY 12 HOURS AS NEEDED  Patient taking differently: Take 2,000 mg by mouth 2 (two) times daily as needed (fever blisters).   [name], [name], MD  Active Self             Patient Active Problem List   Diagnosis Date Noted   ILD (interstitial lung disease) ([place])    Pulmonary fibrosis ([place])    SIRS (systemic inflammatory response syndrome) ([place]) 12/10/2021   Hyperkalemia 12/10/2021   Type 2 diabetes mellitus with hyperlipidemia ([place]) 05/22/2020   Acute respiratory failure ([place]) 05/19/2020   Hypokalemia 05/19/2020   Secondary hypercoagulable state ([place]) 02/16/2020   CHF exacerbation ([place]) 05/31/2018   Bronchitis 05/31/2018   Primary osteoarthritis of left knee 04/23/2017   Degenerative arthritis of left knee 04/19/2017   Acute on chronic diastolic CHF (congestive heart failure) ([place]) 11/12/2016   Atypical atrial flutter ([place]) 09/30/2016   Hematoma 09/30/2016   Hypotension 09/30/2016   Persistent atrial fibrillation ([place]) 09/29/2016   Hyperlipidemia    HLD (hyperlipidemia)    Controlled type 2 diabetes mellitus without complication ([place])    Acute respiratory failure with hypoxia ([place]) 03/26/2016   DDD (degenerative disc disease), lumbar 04/05/2015   Degenerative disc disease, lumbar 04/05/2015   Severe obesity (BMI >= 40) ([place]) 02/16/2015   Fatty liver disease, nonalcoholic 77/82/4235   Cough, persistent 04/03/2014   Acute on chronic diastolic congestive heart failure ([place]) 10/07/2013   Pre-diabetes 09/24/2013   Peripheral edema 08/05/2013   Neoplasm of uncertain behavior of skin of back 11/09/2012   Edema 11/04/2012   Bleeding mole 11/04/2012   Pulmonary hypertension ([place]) 06/11/2012   Pneumonia 06/05/2012   Hypoxia 06/05/2012   Anemia 06/05/2012   Hemorrhoids 05/11/2012   Dyspepsia 05/11/2012   Medication management 04/20/2011   Positional vertigo 10/07/2010   Anticoagulant long-term use    HERPES LABIALIS 12/30/2009   INTERTRIGO 12/30/2009   Class 2 obesity 06/02/2009   Obstructive sleep apnea 10/12/2008   HYPERLIPIDEMIA 09/15/2008   SNORING 09/15/2008   Essential hypertension 06/19/2008   CAD S/P percutaneous  coronary angioplasty 06/19/2008   Paroxysmal atrial fibrillation ([place]) 06/19/2008   BRADYCARDIA-TACHYCARDIA SYNDROME 06/19/2008   CEREBRAL ANEURYSM 06/19/2008   Allergic rhinitis 06/19/2008   HYPERGLYCEMIA 06/19/2008   DEPRESSION, HX OF 06/19/2008  CEREBROVASCULAR ACCIDENT, HX OF 06/19/2008   PACEMAKER, PERMANENT 06/19/2008    Immunization History  Administered Date(s) Administered   Fluad Quad(high Dose 65+) 04/10/2019, 04/20/2021   Influenza Split 04/18/2011, 05/06/2012   Influenza Whole 07/04/2007, 06/02/2009, 03/01/2010   Influenza, High Dose Seasonal PF 04/03/2014, 04/18/2016, 04/05/2017, 05/06/2018   Influenza,inj,Quad PF,6+ Mos 03/07/2013, 04/06/2015   PFIZER(Purple Top)SARS-COV-2 Vaccination 07/22/2019, 08/10/2019, 03/29/2020   PNEUMOCOCCAL CONJUGATE-20 04/22/2021   Pfizer Covid-19 Vaccine Bivalent Booster 31yr & up 04/22/2021   Pneumococcal Conjugate-13 09/24/2013, 04/20/2020   Pneumococcal Polysaccharide-23 07/03/2005, 07/04/2007, 04/18/2011   Td 06/02/2009   Zoster Recombinat (Shingrix) 04/09/2020   Zoster, Live 07/03/2006   Patient brought Dexcom G7 CGM supplies and presents today for education on use and initial placement.  I have reviewed proper use, including but not limited to: glucose direction and how to incorporate it in treatment decisions including meal insulin and exercise, evaluating previous evening trends first thing in the morning, and downloading and attaching to email.  Sensor lag was explained. Oriented to the reader device and reminded not to discard Transmitter when the sensors are replaced every 10 days. Set lower BG alarm to 70 and upper BG alarm to 250.  No calibration and no finger sticks needed. Half-hour warm up and staying within 20 feet of transmitter reviewed. Advised not to administer insulin within 3 inches of sensor.  Procedure and information discussed with patient. Patient has no further questions at this time. Verbalizes understanding and  agrees to have CGM placed.  left arm cleaned and prepared as instructed by manufacturers instructions. Dexcom G7 CGM placed as instructed by manufacturers instructions. No redness, swelling, bleeding, or bruising noted. Sensor activated and monitoring at this time. Patient is aware to resume normal activities including bathing, getting dressed, and exercise.   Conditions to be addressed/monitored:  Hypertension, Hyperlipidemia, Diabetes, Atrial Fibrillation, Heart Failure, Coronary Artery Disease, Anxiety and Allergic Rhinitis  Conditions addressed this visit: Diabetes, GERD  Care Plan : CCM Pharmacy Care Plan  Updates made by PViona Gilmore RHamblensince 02/08/2022 12:00 AM     Problem: Problem: Hypertension, Hyperlipidemia, Diabetes, Atrial Fibrillation, Heart Failure, Coronary Artery Disease, Anxiety and Allergic Rhinitis      Long-Range Goal: Patient-Specific Goal   Start Date: 11/26/2020  Expected End Date: 11/26/2021  Recent Progress: On track  Priority: High  Note:   Current Barriers:  Unable to maintain control of diabetes  Pharmacist Clinical Goal(s):  Patient will achieve adherence to monitoring guidelines and medication adherence to achieve therapeutic efficacy achieve control of diabetes as evidenced by home blood sugar readings and A1c  through collaboration with PharmD and provider.   Interventions: 1:1 collaboration with [name], WStandley Brooking MD regarding development and update of comprehensive plan of care as evidenced by provider attestation and co-signature Inter-disciplinary care team collaboration (see longitudinal plan of care) Comprehensive medication review performed; medication list updated in electronic medical record  Hypertension (BP goal <130/80) -Not ideally controlled -Current treatment: Diltiazem (Cardizem CD) 3656m 1 capsule once daily  - Appropriate, Query effective, Safe, Accessible Metoprolol tartrate 50 mg 1 tablet in the AM and 2 tablets in the PM -  Appropriate, Query effective, Safe, Accessible Spironolactone 1/2 tablet once daily - Appropriate, Query effective, Safe, Accessible -Medications previously tried: indapamide, losartan -Current home readings: 124/64 (couple times a week) -Current dietary habits: did not discuss -Current exercise habits: limited due to shortness of breath -Denies hypotensive/hypertensive symptoms -Educated on Importance of home blood pressure monitoring; Proper BP monitoring technique; -Counseled  to monitor BP at home weekly, document, and provide log at future appointments -Counseled on diet and exercise extensively Recommended to continue current medication  Hyperlipidemia: (LDL goal < 55) -Uncontrolled -Current treatment: Atorvastatin 624m, 1 tablet once daily - Appropriate, Query effective, Safe, Accessible -Medications previously tried: none  -Current dietary patterns: did not discuss -Current exercise habits: limited due to shortness of breath -Educated on Cholesterol goals;  Importance of limiting foods high in cholesterol; -Counseled on diet and exercise extensively Recommended to continue current medication  CAD (Goal: prevent heart attacks and strokes) -Controlled -Current treatment  Atorvastatin 47m 1 tablet daily  - Appropriate, Effective, Safe, Accessible Nitroglycerin 0.24m45mL tablet, place 1 tablet under tongue every five minutes for 3 doses as needed for chest pain - Appropriate, Effective, Safe, Accessible -Medications previously tried: none  -Recommended to continue current medication   Diabetes (A1c goal <7%) -Not ideally controlled -Current medications: Metformin 1000 mg XR 1 tablet twice daily - Appropriate, Query effective, Safe, Accessible Jardiance 25 mg 1 tablet daily - Appropriate, Query effective, Safe, Accessible Tresiba inject 10 units daily - Appropriate, Query effective, Safe, Accessible -Medications previously tried: [place])  -Current home glucose  readings fasting glucose: 205, 212, 202 (> 200 for the last week) post prandial glucose: n/a -Denies hypoglycemic/hyperglycemic symptoms -Current meal patterns:  breakfast: premier protein shake with fruit lunch: salad dinner: steak, salad and bread snacks: did not discuss drinks: did not discuss -Current exercise: limited with shortness of breath -Educated on A1c and blood sugar goals; Benefits of routine self-monitoring of blood sugar; Carbohydrate counting and/or plate method -Counseled to check feet daily and get yearly eye exams -Counseled on diet and exercise extensively Recommended to continue current medication Collaborated with PCP to discuss tapering up on insulin dose.  Atrial Fibrillation (Goal: prevent stroke and major bleeding) -Controlled -CHADSVASC: 6 -Current treatment: Rate control: Diltiazem (Cardizem CD) 360m8m capsule once daily, dofetilide 250 mcg 1 capsule twice daily - Appropriate, Effective, Safe, Accessible Anticoagulation: Eliquis 5mg,25mtablet twice daily  - Appropriate, Effective, Safe, Accessible -Medications previously tried: dronedarone (patient reported retaining fluid), amiodarone, dofetilide, sotalol  -Home BP and HR readings: did not discuss  -Counseled on importance of adherence to anticoagulant exactly as prescribed; avoidance of NSAIDs due to increased bleeding risk with anticoagulants; -Recommended to continue current medication  Heart Failure (Goal: manage symptoms and prevent exacerbations) -Controlled -Last ejection fraction: 60-65% (Date: 05/20/2020) -HF type: Diastolic -NYHA Class: III (marked limitation of activity) -AHA HF Stage: C (Heart disease and symptoms present) -Current treatment: Torsemide 20mg,79mablets daily - Appropriate, Effective, Safe, Accessible Klor-con 20mEq,724mablet daily - Appropriate, Effective, Safe, Accessible Jardiance 25 mg 1 tablet daily - Appropriate, Effective, Safe, Accessible Metoprolol tartrate  50 mg 1 tablet in the AM and 2 tablets in the PM - Appropriate, Effective, Safe, Accessible Metolazone 2.5 mg 1 tablet every 4 days as needed - Appropriate, Effective, Safe, Accessible -Medications previously tried: n/a  -Current home BP/HR readings: did not discuss -Current dietary habits: limiting salt intake -Current exercise habits: limited due to shortness of breath -Educated on Importance of weighing daily; if you gain more than 3 pounds in one day or 5 pounds in one week, call cardiologist -Recommended to continue current medication  Stress/Anxiety (Goal: minimize symptoms) -Controlled -Current treatment: Duloxetine 60mg,1 105mule once daily - Appropriate, Effective, Safe, Accessible -Medications previously tried/failed: fluoxetine -PHQ9: 0 -GAD7: n/a -Educated on Benefits of medication for symptom control -Recommended to continue current medication  Allergic rhinitis (Goal: minimize symptoms) -Controlled -Current treatment  Fluticasone (Flonase) 60mg/ act nasal spray, 1 spray into both nostrils daily  - Appropriate, Effective, Safe, Accessible Loratadine 13m1 tablet once daily - Appropriate, Effective, Safe, Accessible -Medications previously tried: none  -Recommended to continue current medication  GERD (Goal: minimize symptoms) -Controlled -Current treatment  Omeprazole 2055m1 capsule twice daily - Appropriate, Effective, Safe, Accessible -Medications previously tried: none  -Recommended to continue current medication Patient is taking for stomach pain and reports it is helping now with twice daily  Bone health (Goal prevent fractures) -Controlled -Last DEXA Scan: 04/08/2014  T-Score femoral neck: 0.85  T-Score total hip: n/a  T-Score lumbar spine: n/a  T-Score forearm radius: n/a  10-year probability of major osteoporotic fracture: 5.9%  10-year probability of hip fracture: 0.2% -Patient is not a candidate for pharmacologic treatment -Current treatment   Calcium carbonate/ vitamin D, 1 tablet at bedtime Cholecalciferol (125m5m 5000 units, 1 tablet every evening -Medications previously tried: none  -Recommend [phone] units of vitamin D daily. Recommend 1200 mg of calcium daily from dietary and supplemental sources. Recommend weight-bearing and muscle strengthening exercises for building and maintaining bone density. -Recommended to continue current medication  Health Maintenance -Vaccine gaps: Shingrix -Current therapy:  APAP 500mg82min the AM, and 2 in the PM  Lysine 1000mg,67mablet at bedtime  Multivitamin, 1 tablet once daily  Systane, 1 drop into both eyes twice daily as needed Valacyclovir 1000mg, 37mblet every twelve hours as needed for fever blisters/ cold sores  -Educated on Cost vs benefit of each product must be carefully weighed by individual consumer -Patient is satisfied with current therapy and denies issues -Recommended to continue current medication  Patient Goals/Self-Care Activities Patient will:  - take medications as prescribed check glucose daily, document, and provide at future appointments engage in dietary modifications by following healthy plate model  Follow Up Plan: Face to Face appointment with care management team member scheduled for:  1 month         Medication Assistance: None required.  Patient affirms current coverage meets needs.  Compliance/Adherence/Medication fill history: Care Gaps: Shingrix - 2nd dose, colonoscopy, eye exam, urine microalbumin, tetanus, foot exam BP-154/66 ( 01/16/22) A1c - 7.7% (12/26/21)  Star-Rating Drugs: Metformin (Glucophage) 500 mg - Last filled 01/12/22 90 DS at [facility] Empagliflozin (Jardiance) 25 mg - Last filled 11/17/21 100 DS at [facility] Atorvastatin (Lipitor) 40 mg - Last filled 12/07/21 100 DS at [facility]  Patient's preferred pharmacy is:  [id number] [id number] [phone]ed Care[facility] Sites [address] [phone]-[place]775883254SLady Gary63[place]askaLAWNDALEPinelandWNDALENanwalekOLady [phone] Delivery ([facility]) - [phone] [phone] box? Yes - patient uses weekly pill box for 2 weeks Pt endorses 100% compliance  We discussed: Benefits of medication synchronization, packaging and delivery as well as enhanced pharmacist oversight with [facility]. Patient decided to: Continue current medication management strategy  Care Plan  and Follow Up Patient Decision:  Patient agrees to Care Plan and Follow-up.  Plan: Face to Face appointment with care management team member scheduled for: 1 month  [name], PharmD [place] Pharmacist [place] [facility] [place] [phone]

## 2022-02-07 NOTE — Telephone Encounter (Signed)
Contacted pharmacy for verbal order on pen needles for [place]. Size: 32G x 86m  Phone call was incomplete. The worker was rush and hung up before informing them the size needle and asking for name.

## 2022-02-08 ENCOUNTER — Telehealth: Payer: Self-pay | Admitting: *Deleted

## 2022-02-08 MED ORDER — EMPAGLIFLOZIN 25 MG PO TABS: 25.0000 mg | ORAL_TABLET | Freq: Every day | ORAL | 0 refills | Status: DC

## 2022-02-08 MED ORDER — DULOXETINE HCL 60 MG PO CPEP: 60.0000 mg | ORAL_CAPSULE | Freq: Every day | ORAL | 0 refills | Status: DC

## 2022-02-08 MED ORDER — METFORMIN HCL 1000 MG PO TABS: 1000.0000 mg | ORAL_TABLET | Freq: Two times a day (BID) | ORAL | 0 refills | Status: DC

## 2022-02-08 NOTE — Telephone Encounter (Signed)
Refills sent

## 2022-02-08 NOTE — Telephone Encounter (Signed)
-----   Message from Viona Gilmore, [facility] sent at 02/08/2022  1:57 PM EDT ----- Regarding: Refills Hi,  [name] is going to try out adherence packaging with [facility]. Can you please send refills of the following to [facility]:  -duloxetine -Jardiance -metformin   Thank you! Maddie

## 2022-02-10 ENCOUNTER — Other Ambulatory Visit: Payer: Self-pay | Admitting: Cardiovascular Disease

## 2022-02-10 ENCOUNTER — Telehealth: Payer: Self-pay | Admitting: Pharmacist

## 2022-02-10 ENCOUNTER — Other Ambulatory Visit (HOSPITAL_COMMUNITY): Payer: Self-pay | Admitting: *Deleted

## 2022-02-10 DIAGNOSIS — I480 Paroxysmal atrial fibrillation: Secondary | ICD-10-CM

## 2022-02-10 MED ORDER — METOLAZONE 2.5 MG PO TABS: ORAL_TABLET | ORAL | 2 refills | Status: DC

## 2022-02-10 MED ORDER — ATORVASTATIN CALCIUM 40 MG PO TABS: 40.0000 mg | ORAL_TABLET | Freq: Every day | ORAL | 0 refills | Status: DC

## 2022-02-10 MED ORDER — TORSEMIDE 20 MG PO TABS: 60.0000 mg | ORAL_TABLET | Freq: Every day | ORAL | 0 refills | Status: DC

## 2022-02-10 MED ORDER — METOPROLOL TARTRATE 50 MG PO TABS: 50.0000 mg | ORAL_TABLET | Freq: Two times a day (BID) | ORAL | 0 refills | Status: DC

## 2022-02-10 MED ORDER — APIXABAN 5 MG PO TABS: 5.0000 mg | ORAL_TABLET | Freq: Two times a day (BID) | ORAL | 1 refills | Status: DC

## 2022-02-10 MED ORDER — DILTIAZEM HCL ER 360 MG PO TB24: 360.0000 mg | ORAL_TABLET | Freq: Every day | ORAL | 0 refills | Status: DC

## 2022-02-10 MED ORDER — DOFETILIDE 250 MCG PO CAPS: 250.0000 ug | ORAL_CAPSULE | Freq: Two times a day (BID) | ORAL | 3 refills | Status: DC

## 2022-02-10 NOTE — Addendum Note (Signed)
Addended by: Wonda Horner on: 02/10/2022 01:14 PM   Modules accepted: Orders

## 2022-02-10 NOTE — Telephone Encounter (Signed)
*  STAT* If patient is at the pharmacy, call can be transferred to refill team.   1. Which medications need to be refilled? (please list name of each medication and dose if known)   apixaban (ELIQUIS) 5 MG TABS tablet    atorvastatin (LIPITOR) 40 MG tablet    diltiazem (CARDIZEM LA) 360 MG 24 hr tablet    dofetilide (TIKOSYN) 250 MCG capsule    metoprolol tartrate (LOPRESSOR) 50 MG tablet    torsemide (DEMADEX) 20 MG tablet    metolazone (ZAROXOLYN) 2.5 MG tablet    2. Which pharmacy/location (including street and city if local pharmacy) is medication to be sent to?  [facility] - [place], [place] - [address] Phone:  [phone]        3. Do they need a 30 day or 90 day supply?  30 day

## 2022-02-10 NOTE — Telephone Encounter (Signed)
Prescription refill request for Eliquis received. Indication: Afib  Last office visit:12/26/21 Scr: 1.42 (01/16/22)  Age: 76 Weight: 110.1kg  Appropriate dose and refill sent to requested pharmacy.

## 2022-02-10 NOTE — Addendum Note (Signed)
Addended by: Leonidas Romberg on: 02/10/2022 01:18 PM   Modules accepted: Orders

## 2022-02-10 NOTE — Chronic Care Management (AMB) (Cosign Needed Addendum)
Chronic Care Management    Outreach Note   Name: [name]  MRN: [id number] DOB: [birth year]  Referred by: [name], MD  Reason for referral: Chronic care management  Reviewed chart for medication changes ahead of medication coordination call.  BP Readings from Last 3 Encounters:  01/16/22 (!) 154/66  01/12/22 120/62  01/04/22 (!) 110/45    Lab Results  Component Value Date   HGBA1C 7.7 (A) 12/26/2021     Verbal consent obtained for [facility] enhanced pharmacy services (medication synchronization, adherence packaging, delivery coordination). A medication sync plan was created to allow patient to get all medications delivered once every 30 to 90 days per patient preference. Patient understands they have freedom to choose pharmacy and clinical pharmacist will coordinate care between all prescribers and [facility].  Patient requested to obtain medications through Adherence Packaging  30 Days   Med sync plan: Torsemide 20 Mg  Loralie Champagne MD February 10, 2022 3      01/03/22 100 DS April 13, 2022  Eliquis 5 Mg  Nahser, Wonda Cheng, MD  February 10, 2022  1   1  #65 on 02/13/22 March 17, 2022  Atorvastatin 40 Mg  Constance Haw, MD  February 10, 2022  1     12/07/21 100 DS March 17, 2022  Diltiazem 360 Mg  Constance Haw, MD  February 10, 2022  1     #29 tabs on hand as of 02/07/22 March 08, 2022  Duloxetine 60 Mg x    1     #12 caps on hand as of 8/8 February 19, 2022  Jardiance 25 Mg x   1      02/08/22 30 DS March 10, 2022  Metformin 1000 Mg x    1  1   01/12/22 90 DS April 12, 2022  Potassium 20 MeQ  Marlyce Huge [place], [place] February 10, 2022     1  11/17/21 100 DS February 25, 2022  Omeprazole 20 Mg  OTC  1    1  OTC - add when packs start  Dofetilide 250 Mg  Constance Haw, MD  February 10, 2022  1   1  12/28/21 90 DS March 28, 2022  Spironolactone 25 Mg Marlyce Huge [place], [place] February 10, 2022  0.5     12/26/21 90 DS March 26, 2022   Metoprolol tartrate 50 Mg Constance Haw, MD  February 10, 2022  1   2  01/02/22 90 DS April 02, 2022  Metolazone 2.5 Mg  Constance Haw, MD  February 10, 2022  P R N   12/20/21 25 Tabs  Tresiba  100 Unit x         02/07/22 90 DS May 08, 2022  Pen needles x         02/07/22 90 DS May 08, 2022  Dexcom G7 sensors x         02/07/22 30 DS March 09, 2022    Prescriptions requested from PCP and specialists as noted below: torsemide (DEMADEX) 20 MG  apixaban (ELIQUIS) 5 MG  atorvastatin (LIPITOR) 40 MG diltiazem (CARDIZEM LA) 360 MG  dofetilide (TIKOSYN) 250 MCG  metoprolol tartrate (LOPRESSOR) 50 MG  metolazone (ZAROXOLYN) 2.5 MG  [address] [place], [place] [address]  Phone:  [phone]    Spoke to [facility] who confirms request has been sent   spironolactone (ALDACTONE) 25 MG  potassium chloride  SA (KLOR-CON M) 20 MEQ Joette Catching, [place]- C  [address] [place] [address]  Phone:  [phone]    Left Message on Nurse Line no ans on Physician Line   omeprazole ([place]) 20 MG  Unable to locate fills/prescriber   Medications: Outpatient Encounter Medications as of 02/10/2022  Medication Sig   apixaban (ELIQUIS) 5 MG TABS tablet TAKE 1 TABLET BY MOUTH TWICE  DAILY   atorvastatin (LIPITOR) 40 MG tablet TAKE 1 TABLET BY MOUTH DAILY   Continuous Blood Gluc Receiver (DEXCOM G7 RECEIVER) DEVI Use as directed while on insulin   Continuous Blood Gluc Receiver (FREESTYLE LIBRE 14 DAY READER) DEVI Use   14 days for diabetes control. (Patient not taking: Reported on 12/29/2021)   Continuous Blood Gluc Sensor (DEXCOM G7 SENSOR) MISC Use as directed while on insulin   diltiazem (CARDIZEM LA) 360 MG 24 hr tablet TAKE 1 TABLET BY MOUTH DAILY   dofetilide (TIKOSYN) 250 MCG capsule TAKE ONE CAPSULE BY MOUTH TWICE A DAY   DULoxetine (CYMBALTA) 60 MG capsule Take 1 capsule (60 mg total) by mouth daily.   empagliflozin (JARDIANCE) 25 MG TABS tablet  Take 1 tablet (25 mg total) by mouth daily.   fluticasone (FLONASE) 50 MCG/ACT nasal spray Place 1 spray into both nostrils daily as needed for allergies.   insulin degludec (TRESIBA) 100 UNIT/ML FlexTouch Pen Inject 10 Units into the skin daily.   loratadine (CLARITIN) 10 MG tablet TAKE 1 TABLET BY MOUTH EVERY DAY (Patient taking differently: Take 10 mg by mouth daily as needed for allergies.)   Lysine 1000 MG TABS Take 1,000 mg by mouth at bedtime.    metFORMIN (GLUCOPHAGE) 1000 MG tablet Take 1 tablet (1,000 mg total) by mouth 2 (two) times daily.   metolazone (ZAROXOLYN) 2.5 MG tablet TAKE 1 TABLET BY MOUTH EVERY 4  DAYS AS NEEDED FOR SWELLING  PLEASE KEEP UPCOMING APPOINTMENT IN JAN, 2023 FOR FURTHER REFILLS   metoprolol tartrate (LOPRESSOR) 50 MG tablet Take 50 mg by mouth 2 (two) times daily. 50 mg in the AM and 100 mg in the PM   Multiple Vitamin (MULTIVITAMIN WITH MINERALS) TABS tablet Take 1 tablet by mouth daily. Centrum   nitroGLYCERIN (NITROSTAT) 0.4 MG SL tablet Place 1 tablet (0.4 mg total) under the tongue every 5 (five) minutes x 3 doses as needed for chest pain. PLEASE CALL [phone] AND SCHEDULE AN OVERDUE APPT WITH DR Acie Fredrickson FOR FURTHER REFILLS TO BE GRANTED (2ND ATTEMPT)   NON FORMULARY CPAP at bedtime   nystatin-triamcinolone (MYCOLOG II) cream Apply 1 application topically 2 (two) times daily as needed. (Patient taking differently: Apply 1 application  topically 2 (two) times daily as needed (rash).)   omeprazole (PRILOSEC) 20 MG capsule Take 20 mg by mouth 2 (two) times daily.   Polyethyl Glycol-Propyl Glycol (SYSTANE) 0.4-0.3 % SOLN Place 1 drop into both eyes at bedtime.   potassium chloride SA (KLOR-CON M) 20 MEQ tablet Take 1 tablet (20 mEq total) by mouth daily.   spironolactone (ALDACTONE) 25 MG tablet Take 0.5 tablets (12.5 mg total) by mouth daily.   torsemide (DEMADEX) 20 MG tablet Take 3 tablets (60 mg total) by mouth daily.   valACYclovir (VALTREX) 1000 MG  tablet TAKE 2 TABLETS BY MOUTH EVERY 12 HOURS AS NEEDED (Patient taking differently: Take 2,000 mg by mouth 2 (two) times daily as needed (fever blisters).)   No facility-administered encounter medications on file as of 02/10/2022.    Care Gaps:  COVID Booster - Overdue Flu Vaccine - Overdue Foot Exam - Postponed Urine Micro - Postponed Zoster Vaccine - Postponed TDAP - Postponed Lab Results  Component Value Date   HGBA1C 7.7 (A) 12/26/2021    Star Rating Drugs: Metformin (Glucophage) 500 mg - Last filled 01/12/22 90 DS at [facility] Empagliflozin (Jardiance) 25 mg - Last filled [facility]/08/22 [facility] Atorvastatin (Lipitor) 40 mg - Last filled 12/07/21 100 DS at [facility] Pharmacist Assistant [phone]

## 2022-02-13 ENCOUNTER — Telehealth: Payer: Self-pay | Admitting: Cardiovascular Disease

## 2022-02-13 DIAGNOSIS — M9901 Segmental and somatic dysfunction of cervical region: Secondary | ICD-10-CM | POA: Diagnosis not present

## 2022-02-13 DIAGNOSIS — M50322 Other cervical disc degeneration at C5-C6 level: Secondary | ICD-10-CM | POA: Diagnosis not present

## 2022-02-13 DIAGNOSIS — M9903 Segmental and somatic dysfunction of lumbar region: Secondary | ICD-10-CM | POA: Diagnosis not present

## 2022-02-13 DIAGNOSIS — M5136 Other intervertebral disc degeneration, lumbar region: Secondary | ICD-10-CM | POA: Diagnosis not present

## 2022-02-13 MED ORDER — METOPROLOL TARTRATE 50 MG PO TABS: ORAL_TABLET | ORAL | 0 refills | Status: DC

## 2022-02-13 NOTE — Telephone Encounter (Signed)
Reached out to patient for clarification on Metoprolol Tartrate instructions. Current instructions read "'50mg'$  twice daily. '50mg'$  in the morning and '100mg'$  in the evening." Patient states she has been taking it per the latter (total of 3 tablets per day). Will send renewal in with correct quantity and sig under Dr Acie Fredrickson. Pt has switched provider's to [place], but hasn't seen her yet.

## 2022-02-13 NOTE — Telephone Encounter (Signed)
Pt c/o medication issue:  1. Name of Medication: metoprolol tartrate (LOPRESSOR) 50 MG tablet  2. How are you currently taking this medication (dosage and times per day)? Needs verification   3. Are you having a reaction (difficulty breathing--STAT)? no  4. What is your medication issue? [facility] states the instructions are to take 1 tablet in the am and 2 in the evening. She say the quantity they received was for 60 instead of 90. She says if the instructions are correct they need a prescription sent over for 90 tablets. Phone: [phone]

## 2022-02-14 ENCOUNTER — Ambulatory Visit: Payer: Medicare Other | Admitting: Pharmacist

## 2022-02-14 DIAGNOSIS — E1165 Type 2 diabetes mellitus with hyperglycemia: Secondary | ICD-10-CM

## 2022-02-14 DIAGNOSIS — M5136 Other intervertebral disc degeneration, lumbar region: Secondary | ICD-10-CM | POA: Diagnosis not present

## 2022-02-14 DIAGNOSIS — I5032 Chronic diastolic (congestive) heart failure: Secondary | ICD-10-CM

## 2022-02-14 DIAGNOSIS — M50322 Other cervical disc degeneration at C5-C6 level: Secondary | ICD-10-CM | POA: Diagnosis not present

## 2022-02-14 DIAGNOSIS — M9901 Segmental and somatic dysfunction of cervical region: Secondary | ICD-10-CM | POA: Diagnosis not present

## 2022-02-14 DIAGNOSIS — M9903 Segmental and somatic dysfunction of lumbar region: Secondary | ICD-10-CM | POA: Diagnosis not present

## 2022-02-14 NOTE — Progress Notes (Signed)
Chronic Care Management Pharmacy Note  02/14/2022 Name:  [name] MRN:  [id number] DOB:  [birth year]  Summary: A1c not at goal of < 7% Pt bought a new phone and wanted to sync Dexcom to it   Recommendations/Changes made from today's visit: -Set up Dexcom with clinic clarity account -Recommended increasing Tresiba by 2 units every 2-3 days to target fasting BGs < 140   Plan: Follow up in office in 1 month  Subjective: [name] is an 76 y.o. year old female who is a primary patient of [name], [name], MD.  The CCM team was consulted for assistance with disease management and care coordination needs.    Engaged with patient by telephone for follow up visit in response to provider referral for pharmacy case management and/or care coordination services.   Consent to Services:  The patient was given information about Chronic Care Management services, agreed to services, and gave verbal consent prior to initiation of services.  Please see initial visit note for detailed documentation.   Patient Care Team: [name], [name], MD as PCP - General (Internal Medicine) Constance Haw, MD as PCP - Electrophysiology (Cardiology) Nahser, Wonda Cheng, MD as PCP - Cardiology (Cardiology) Gaynelle Arabian, MD as Consulting Physician (Orthopedic Surgery) Brand Males, MD as Consulting Physician (Pulmonary Disease) Viona Gilmore, [facility] as Pharmacist (Pharmacist)  Recent office visits: 01/12/22 Rolene Arbour, LPN: Patient presented for AWV.  12/26/21 [name], MD: Patient presented for hospital follow up.  08/23/21 [name], MD: Patient presented for diabetes follow- up and abdominal pain. A1c slightly decreased to 7.7%. Plan for CGM start and reached out to [facility] team for support.  04/20/21 [name], [name] - Patient presented for queasiness and other concerns. No medication changes. A1c increased to 8.2%.  Recent consult visits: 01/16/22 Patient presented for Heart & Vascular  Transitions of Care appointment.   01/04/22 Patient presented for right heart cath.  12/29/21 Geraldo Pitter, NP (pulmonary): Patient presented for ILD and hospital follow up. Follow up in 3 months.  12/26/21 Marlyce Huge, PA-C (CHF clinic): Patient presented for hospital follow up. Decreased potassium to 20 mEq per day and started spironolactone 12.5 mg daily.   10/06/21 Tommye Standard, PA-C (cardiology): Patient presented for Afib follow up. Changed metoprolol to 50 mg in the AM and 100 mg in the PM. Follow up in 6 months.  Hospital visits: 6/10-6/16/23 Patient admitted to [facility] for CHF exacerbation. Prescribed metolazone for PRN use.  Objective:  Lab Results  Component Value Date   CREATININE 1.42 (H) 01/16/2022   BUN 18 01/16/2022   GFR 38.69 (L) 12/28/2021   GFRNONAA 39 (L) 01/16/2022   GFRAA 40 (L) 07/28/2020   NA 138 01/16/2022   K 4.5 01/16/2022   CALCIUM 9.6 01/16/2022   CO2 32 01/16/2022   GLUCOSE 165 (H) 01/16/2022    Lab Results  Component Value Date/Time   HGBA1C 7.7 (A) 12/26/2021 03:38 PM   HGBA1C 8.0 (H) 08/23/2021 09:35 AM   HGBA1C 8.2 (H) 04/20/2021 11:30 AM   GFR 38.69 (L) 12/28/2021 09:04 AM   GFR 42.98 (L) 08/23/2021 09:35 AM   MICROALBUR <0.7 01/23/2017 10:51 AM    Last diabetic Eye exam:  Lab Results  Component Value Date/Time   HMDIABEYEEXA No Retinopathy 07/05/2021 03:22 PM    Last diabetic Foot exam: No results found for: "HMDIABFOOTEX"   Lab Results  Component Value Date   CHOL 147 08/23/2021   HDL 47.20  08/23/2021   LDLCALC 68 04/10/2019   LDLDIRECT 76.0 08/23/2021   TRIG 206.0 (H) 08/23/2021   CHOLHDL 3 08/23/2021       Latest Ref Rng & Units 12/26/2021    9:56 AM 12/11/2021    3:19 AM 12/10/2021   12:22 AM  Hepatic Function  Total Protein 6.5 - 8.1 g/dL 7.5  6.9  6.9   Albumin 3.5 - 5.0 g/dL 3.9  3.4  3.6   AST 15 - 41 U/L 36  28  73   ALT 0 - 44 U/L 46  47  65   Alk Phosphatase 38 - 126 U/L 83  84  88    Total Bilirubin 0.3 - 1.2 mg/dL 0.7  1.1  1.1     Lab Results  Component Value Date/Time   TSH 4.61 08/23/2021 09:35 AM   TSH 4.37 04/20/2021 11:30 AM   FREET4 0.86 04/20/2021 11:30 AM   FREET4 0.83 05/06/2018 09:23 AM       Latest Ref Rng & Units 01/04/2022    8:11 AM 12/16/2021    3:09 AM 12/15/2021    4:03 AM  CBC  WBC 4.0 - 10.5 K/uL  8.0  8.4   Hemoglobin 12.0 - 15.0 g/dL 12.0 - 15.0 g/dL 14.3    14.6  13.1  13.4   Hematocrit 36.0 - 46.0 % 36.0 - 46.0 % 42.0    43.0  40.3  41.3   Platelets 150 - 400 K/uL  255  253     Lab Results  Component Value Date/Time   VD25OH 79.26 12/28/2021 09:04 AM   VD25OH 32.83 01/23/2017 10:51 AM    Clinical ASCVD: No  The 10-year ASCVD risk score (Arnett DK, et al., 2019) is: 51.6%   Values used to calculate the score:     Age: 46 years     Sex: Female     Is Non-Hispanic African American: No     Diabetic: Yes     Tobacco smoker: No     Systolic Blood Pressure: 979 mmHg     Is BP treated: Yes     HDL Cholesterol: 47.2 mg/dL     Total Cholesterol: 147 mg/dL       01/12/2022    9:50 AM 12/26/2021    2:46 PM 11/30/2020    8:50 AM  Depression screen PHQ 2/9  Decreased Interest 0 1 0  Down, Depressed, Hopeless 0 1 1  PHQ - 2 Score 0 2 1  Altered sleeping 0 2   Tired, decreased energy 0 1   Change in appetite 0 2   Feeling bad or failure about yourself  0 0   Trouble concentrating 0 1   Moving slowly or fidgety/restless 0 0   Suicidal thoughts 0 0   PHQ-9 Score 0 8   Difficult doing work/chores Not difficult at all Somewhat difficult      CHA2DS2/VAS Stroke Risk Points  Current as of about an hour ago     6 >= 2 Points: High Risk  1 - 1.99 Points: Medium Risk  0 Points: Low Risk    Last Change: N/A      Details    This score determines the patient's risk of having a stroke if the  patient has atrial fibrillation.       Points Metrics  1 Has Congestive Heart Failure:  Yes    Current as of about an hour ago  1 Has  Vascular Disease:  Yes  Current as of about an hour ago  1 Has Hypertension:  Yes    Current as of about an hour ago  1 Age:  35    Current as of about an hour ago  1 Has Diabetes:  Yes    Current as of about an hour ago  0 Had Stroke:  No  Had TIA:  No  Had Thromboembolism:  No    Current as of about an hour ago  1 Female:  Yes    Current as of about an hour ago     Social History   Tobacco Use  Smoking Status Former   Packs/day: 1.00   Years: 5.00   Total pack years: 5.00   Types: Cigarettes   Start date: 05/11/1978   Quit date: 07/03/1982   Years since quitting: 39.6  Smokeless Tobacco Never   BP Readings from Last 3 Encounters:  01/16/22 (!) 154/66  01/12/22 120/62  01/04/22 (!) 110/45   Pulse Readings from Last 3 Encounters:  01/16/22 66  01/12/22 61  01/04/22 65   Wt Readings from Last 3 Encounters:  01/16/22 242 lb 12.8 oz (110.1 kg)  01/12/22 240 lb (108.9 kg)  01/04/22 238 lb (108 kg)   BMI Readings from Last 3 Encounters:  01/16/22 38.60 kg/m  01/12/22 38.16 kg/m  01/04/22 37.84 kg/m    Assessment/Interventions: Review of patient past medical history, allergies, medications, health status, including review of consultants reports, laboratory and other test data, was performed as part of comprehensive evaluation and provision of chronic care management services.   SDOH:  (Social Determinants of Health) assessments and interventions performed: Yes    SDOH Screenings   Alcohol Screen: Low Risk  (01/12/2022)   Alcohol Screen    Last Alcohol Screening Score (AUDIT): 1  Depression (PHQ2-9): Low Risk  (01/12/2022)   Depression (PHQ2-9)    PHQ-2 Score: 0  Recent Concern: Depression (PHQ2-9) - Medium Risk (12/26/2021)   Depression (PHQ2-9)    PHQ-2 Score: 8  Financial Resource Strain: Low Risk  (02/08/2022)   Overall Financial Resource Strain (CARDIA)    Difficulty of Paying Living Expenses: Not very hard  Food Insecurity: No Food Insecurity  (01/12/2022)   Hunger Vital Sign    Worried About Running Out of Food in the Last Year: Never true    [place] in the Last Year: Never true  Housing: Low Risk  (01/12/2022)   Housing    Last Housing Risk Score: 0  Physical Activity: Inactive (01/12/2022)   Exercise Vital Sign    Days of Exercise per Week: 0 days    Minutes of Exercise per Session: 0 min  Social Connections: Socially Integrated (01/12/2022)   Social Connection and Isolation Panel [NHANES]    Frequency of Communication with Friends and Family: More than three times a week    Frequency of Social Gatherings with Friends and Family: More than three times a week    Attends Religious Services: More than 4 times per year    Active Member of Clubs or Organizations: Yes    Attends Archivist Meetings: More than 4 times per year    Marital Status: Married  Stress: No Stress Concern Present (01/12/2022)   [place]    Feeling of Stress : Not at all  Tobacco Use: Medium Risk (01/16/2022)   Patient History    Smoking Tobacco Use: Former    Smokeless Tobacco Use: Never  Passive Exposure: Not on file  Transportation Needs: No Transportation Needs (01/12/2022)   PRAPARE - Hydrologist (Medical): No    Lack of Transportation (Non-Medical): No     CCM Care Plan  Allergies  Allergen Reactions   Hydrocodone-Guaifenesin Other (See Comments)    Confusion/ memory loss   Adhesive [Tape] Rash and Other (See Comments)    Allergic to defibrillation pads.   Pedi-Pre Tape Spray [Wound Dressing Adhesive] Other (See Comments) and Rash    Allergic to defibrillation pads.    Medications Reviewed Today     Reviewed by Viona Gilmore, [facility] (Pharmacist) on 01/17/22 at [place] List Status: <None>   Medication Order Taking? Sig Documenting Provider Last Dose Status Informant  apixaban (ELIQUIS) 5 MG TABS tablet [id number]  Take 1  tablet (5 mg total) by mouth 2 (two) times daily. Constance Haw, MD  Active Self  atorvastatin (LIPITOR) 40 MG tablet [id number]  TAKE 1 TABLET BY MOUTH DAILY Camnitz, Ocie Doyne, MD  Active Self  Continuous Blood Gluc Receiver (FREESTYLE LIBRE 14 DAY READER) DEVI [id number]  Use   14 days for diabetes control.  Patient not taking: Reported on 12/29/2021   [name], MD  Active   diltiazem (CARDIZEM LA) 360 MG 24 hr tablet [id number]  TAKE 1 TABLET BY MOUTH DAILY Camnitz, Will Hassell Done, MD  Active   dofetilide (TIKOSYN) 250 MCG capsule [id number]  TAKE ONE CAPSULE BY MOUTH TWICE A DAY Camnitz, Will Hassell Done, MD  Active Self  DULoxetine (CYMBALTA) 60 MG capsule [id number]  TAKE 1 CAPSULE BY MOUTH EVERY DAY [name], [name], MD  Active Self  fluticasone (FLONASE) 50 MCG/ACT nasal spray [id number]  Place 1 spray into both nostrils daily as needed for allergies. [provider]  Active Self  JARDIANCE 25 MG TABS tablet [id number]  TAKE 1 TABLET (25 MG TOTAL) BY MOUTH DAILY. [name], [name], MD  Active   loratadine (CLARITIN) 10 MG tablet [id number]  TAKE 1 TABLET BY MOUTH EVERY DAY  Patient taking differently: Take 10 mg by mouth daily as needed for allergies.   [name], [name], MD  Active Self  Lysine 1000 MG TABS [id number] Yes Take 1,000 mg by mouth at bedtime.  [provider] Taking Active Self  metFORMIN (GLUCOPHAGE) 1000 MG tablet [id number]  Take 1 tablet (1,000 mg total) by mouth 2 (two) times daily. [name], [name], MD  Active Self  metolazone (ZAROXOLYN) 2.5 MG tablet [id number]  TAKE 1 TABLET BY MOUTH EVERY 4  DAYS AS NEEDED FOR SWELLING  PLEASE KEEP UPCOMING APPOINTMENT IN JAN, 2023 FOR FURTHER REFILLS Camnitz, Will Hassell Done, MD  Active Self  metoprolol tartrate (LOPRESSOR) 50 MG tablet [id number]  Take 50 mg by mouth 2 (two) times daily. 50 mg in the AM and 100 mg in the PM [provider]  Active   Multiple Vitamin (MULTIVITAMIN WITH MINERALS) TABS tablet [id number]  Yes Take 1 tablet by mouth daily. Centrum [provider] Taking Active Self  nitroGLYCERIN (NITROSTAT) 0.4 MG SL tablet [id number]  Place 1 tablet (0.4 mg total) under the tongue every 5 (five) minutes x 3 doses as needed for chest pain. PLEASE CALL [phone] AND SCHEDULE AN OVERDUE APPT WITH DR Acie Fredrickson FOR FURTHER REFILLS TO BE GRANTED (2ND ATTEMPT) Nahser, Wonda Cheng, MD  Active Self  NON FORMULARY [id number]  CPAP at bedtime [provider]  Active Self  nystatin-triamcinolone (MYCOLOG II) cream [id number]  Apply 1  application topically 2 (two) times daily as needed.  Patient taking differently: Apply 1 application  topically 2 (two) times daily as needed (rash).   [name], [name], MD  Active Self  omeprazole (PRILOSEC) 20 MG capsule [id number]  Take 20 mg by mouth 2 (two) times daily. [provider]  Active Self  Polyethyl Glycol-Propyl Glycol (SYSTANE) 0.4-0.3 % SOLN [id number] Yes Place 1 drop into both eyes at bedtime. [provider] Taking Active Self  potassium chloride SA (KLOR-CON M) 20 MEQ tablet [id number]  Take 1 tablet (20 mEq total) by mouth daily. Joette Catching, PA-C  Active   spironolactone (ALDACTONE) 25 MG tablet [id number]  Take 0.5 tablets (12.5 mg total) by mouth daily. Joette Catching, PA-C  Active   torsemide [facility]) 20 MG tablet [id number]  Take 3 tablets (60 mg total) by mouth daily. Larey Dresser, MD  Active   valACYclovir (VALTREX) 1000 MG tablet [id number]  TAKE 2 TABLETS BY MOUTH EVERY 12 HOURS AS NEEDED  Patient taking differently: Take 2,000 mg by mouth 2 (two) times daily as needed (fever blisters).   [name], [name], MD  Active Self            Patient Active Problem List   Diagnosis Date Noted   ILD (interstitial lung disease) ([place])    Pulmonary fibrosis ([place])    SIRS (systemic inflammatory response syndrome) ([place]) 12/10/2021   Hyperkalemia 12/10/2021   Type 2 diabetes mellitus with hyperlipidemia ([place])  05/22/2020   Acute respiratory failure ([place]) 05/19/2020   Hypokalemia 05/19/2020   Secondary hypercoagulable state ([place]) 02/16/2020   CHF exacerbation ([place]) 05/31/2018   Bronchitis 05/31/2018   Primary osteoarthritis of left knee 04/23/2017   Degenerative arthritis of left knee 04/19/2017   Acute on chronic diastolic CHF (congestive heart failure) ([place]) 11/12/2016   Atypical atrial flutter ([place]) 09/30/2016   Hematoma 09/30/2016   Hypotension 09/30/2016   Persistent atrial fibrillation ([place]) 09/29/2016   Hyperlipidemia    HLD (hyperlipidemia)    Controlled type 2 diabetes mellitus without complication ([place])    Acute respiratory failure with hypoxia ([place]) 03/26/2016   DDD (degenerative disc disease), lumbar 04/05/2015   Degenerative disc disease, lumbar 04/05/2015   Severe obesity (BMI >= 40) ([place]) 02/16/2015   Fatty liver disease, nonalcoholic 18/84/1660   Cough, persistent 04/03/2014   Acute on chronic diastolic congestive heart failure ([place]) 10/07/2013   Pre-diabetes 09/24/2013   Peripheral edema 08/05/2013   Neoplasm of uncertain behavior of skin of back 11/09/2012   Edema 11/04/2012   Bleeding mole 11/04/2012   Pulmonary hypertension ([place]) 06/11/2012   Pneumonia 06/05/2012   Hypoxia 06/05/2012   Anemia 06/05/2012   Hemorrhoids 05/11/2012   Dyspepsia 05/11/2012   Medication management 04/20/2011   Positional vertigo 10/07/2010   Anticoagulant long-term use    HERPES LABIALIS 12/30/2009   INTERTRIGO 12/30/2009   Class 2 obesity 06/02/2009   Obstructive sleep apnea 10/12/2008   HYPERLIPIDEMIA 09/15/2008   SNORING 09/15/2008   Essential hypertension 06/19/2008   CAD S/P percutaneous coronary angioplasty 06/19/2008   Paroxysmal atrial fibrillation ([place]) 06/19/2008   BRADYCARDIA-TACHYCARDIA SYNDROME 06/19/2008   CEREBRAL ANEURYSM 06/19/2008   Allergic rhinitis 06/19/2008   HYPERGLYCEMIA 06/19/2008   DEPRESSION, HX OF 06/19/2008   CEREBROVASCULAR ACCIDENT, HX OF  06/19/2008   PACEMAKER, PERMANENT 06/19/2008    Immunization History  Administered Date(s) Administered   Fluad Quad(high Dose 65+) 04/10/2019, 04/20/2021   Influenza Split 04/18/2011, 05/06/2012   Influenza Whole 07/04/2007, 06/02/2009, 03/01/2010  Influenza, High Dose Seasonal PF 04/03/2014, 04/18/2016, 04/05/2017, 05/06/2018   Influenza,inj,Quad PF,6+ Mos 03/07/2013, 04/06/2015   PFIZER(Purple Top)SARS-COV-2 Vaccination 07/22/2019, 08/10/2019, 03/29/2020   PNEUMOCOCCAL CONJUGATE-20 04/22/2021   Pfizer Covid-19 Vaccine Bivalent Booster 94yr & up 04/22/2021   Pneumococcal Conjugate-13 09/24/2013, 04/20/2020   Pneumococcal Polysaccharide-23 07/03/2005, 07/04/2007, 04/18/2011   Td 06/02/2009   Zoster Recombinat (Shingrix) 04/09/2020   Zoster, Live 07/03/2006   Patient brought Dexcom G7 CGM supplies and presents today for education on use and initial placement.  I have reviewed proper use, including but not limited to: glucose direction and how to incorporate it in treatment decisions including meal insulin and exercise, evaluating previous evening trends first thing in the morning, and downloading and attaching to email.  Sensor lag was explained. Oriented to the reader device and reminded not to discard Transmitter when the sensors are replaced every 10 days. Set lower BG alarm to 70 and upper BG alarm to 250.  No calibration and no finger sticks needed. Half-hour warm up and staying within 20 feet of transmitter reviewed. Advised not to administer insulin within 3 inches of sensor.  Procedure and information discussed with patient. Patient has no further questions at this time. Verbalizes understanding and agrees to have CGM placed.  left arm cleaned and prepared as instructed by manufacturers instructions. Dexcom G7 CGM placed as instructed by manufacturers instructions. No redness, swelling, bleeding, or bruising noted. Sensor activated and monitoring at this time. Patient is aware to  resume normal activities including bathing, getting dressed, and exercise.   Conditions to be addressed/monitored:  Hypertension, Hyperlipidemia, Diabetes, Atrial Fibrillation, Heart Failure, Coronary Artery Disease, Anxiety and Allergic Rhinitis  Conditions addressed this visit: Diabetes, GERD  Care Plan : CCM Pharmacy Care Plan  Updates made by PViona Gilmore RAmandasince 02/14/2022 12:00 AM     Problem: Problem: Hypertension, Hyperlipidemia, Diabetes, Atrial Fibrillation, Heart Failure, Coronary Artery Disease, Anxiety and Allergic Rhinitis      Long-Range Goal: Patient-Specific Goal   Start Date: 11/26/2020  Expected End Date: 11/26/2021  Recent Progress: On track  Priority: High  Note:   Current Barriers:  Unable to maintain control of diabetes  Pharmacist Clinical Goal(s):  Patient will achieve adherence to monitoring guidelines and medication adherence to achieve therapeutic efficacy achieve control of diabetes as evidenced by home blood sugar readings and A1c  through collaboration with PharmD and provider.   Interventions: 1:1 collaboration with [name], WStandley Brooking MD regarding development and update of comprehensive plan of care as evidenced by provider attestation and co-signature Inter-disciplinary care team collaboration (see longitudinal plan of care) Comprehensive medication review performed; medication list updated in electronic medical record  Hypertension (BP goal <130/80) -Not ideally controlled -Current treatment: Diltiazem (Cardizem CD) 3638m 1 capsule once daily  - Appropriate, Query effective, Safe, Accessible Metoprolol tartrate 50 mg 1 tablet in the AM and 2 tablets in the PM - Appropriate, Query effective, Safe, Accessible Spironolactone 1/2 tablet once daily - Appropriate, Query effective, Safe, Accessible -Medications previously tried: indapamide, losartan -Current home readings: 124/64 (couple times a week) -Current dietary habits: did not  discuss -Current exercise habits: limited due to shortness of breath -Denies hypotensive/hypertensive symptoms -Educated on Importance of home blood pressure monitoring; Proper BP monitoring technique; -Counseled to monitor BP at home weekly, document, and provide log at future appointments -Counseled on diet and exercise extensively Recommended to continue current medication  Hyperlipidemia: (LDL goal < 55) -Uncontrolled -Current treatment: Atorvastatin 4045m1 tablet once daily - Appropriate, Query  effective, Safe, Accessible -Medications previously tried: none  -Current dietary patterns: did not discuss -Current exercise habits: limited due to shortness of breath -Educated on Cholesterol goals;  Importance of limiting foods high in cholesterol; -Counseled on diet and exercise extensively Recommended to continue current medication  CAD (Goal: prevent heart attacks and strokes) -Controlled -Current treatment  Atorvastatin 81m, 1 tablet daily  - Appropriate, Effective, Safe, Accessible Nitroglycerin 0.439mSL tablet, place 1 tablet under tongue every five minutes for 3 doses as needed for chest pain - Appropriate, Effective, Safe, Accessible -Medications previously tried: none  -Recommended to continue current medication   Diabetes (A1c goal <7%) -Not ideally controlled -Current medications: Metformin 1000 mg XR 1 tablet twice daily - Appropriate, Query effective, Safe, Accessible Jardiance 25 mg 1 tablet daily - Appropriate, Query effective, Safe, Accessible Tresiba inject 10 units daily - Appropriate, Query effective, Safe, Accessible -Medications previously tried: FaIrancost)  -Current home glucose readings fasting glucose: 205, 212, 202 (> 200 for the last week) post prandial glucose: n/a -Denies hypoglycemic/hyperglycemic symptoms -Current meal patterns:  breakfast: premier protein shake with fruit lunch: salad dinner: steak, salad and bread snacks: did not  discuss drinks: did not discuss -Current exercise: limited with shortness of breath -Educated on A1c and blood sugar goals; Benefits of routine self-monitoring of blood sugar; Carbohydrate counting and/or plate method -Counseled to check feet daily and get yearly eye exams -Counseled on diet and exercise extensively Recommended to continue current medication Collaborated with PCP to increase Tresiba by 2 units every 2-3 days. .  Atrial Fibrillation (Goal: prevent stroke and major bleeding) -Controlled -CHADSVASC: 6 -Current treatment: Rate control: Diltiazem (Cardizem CD) 36052m1 capsule once daily, dofetilide 250 mcg 1 capsule twice daily - Appropriate, Effective, Safe, Accessible Anticoagulation: Eliquis 5mg82m tablet twice daily  - Appropriate, Effective, Safe, Accessible -Medications previously tried: dronedarone (patient reported retaining fluid), amiodarone, dofetilide, sotalol  -Home BP and HR readings: did not discuss  -Counseled on importance of adherence to anticoagulant exactly as prescribed; avoidance of NSAIDs due to increased bleeding risk with anticoagulants; -Recommended to continue current medication  Heart Failure (Goal: manage symptoms and prevent exacerbations) -Controlled -Last ejection fraction: 60-65% (Date: 05/20/2020) -HF type: Diastolic -NYHA Class: III (marked limitation of activity) -AHA HF Stage: C (Heart disease and symptoms present) -Current treatment: Torsemide 20mg22mtablets daily - Appropriate, Effective, Safe, Accessible Klor-con 20mEq71mtablet daily - Appropriate, Effective, Safe, Accessible Jardiance 25 mg 1 tablet daily - Appropriate, Effective, Safe, Accessible Metoprolol tartrate 50 mg 1 tablet in the AM and 2 tablets in the PM - Appropriate, Effective, Safe, Accessible Metolazone 2.5 mg 1 tablet every 4 days as needed - Appropriate, Effective, Safe, Accessible -Medications previously tried: n/a  -Current home BP/HR readings: did not  discuss -Current dietary habits: limiting salt intake -Current exercise habits: limited due to shortness of breath -Educated on Importance of weighing daily; if you gain more than 3 pounds in one day or 5 pounds in one week, call cardiologist -Recommended to continue current medication  Stress/Anxiety (Goal: minimize symptoms) -Controlled -Current treatment: Duloxetine 60mg,186msule once daily - Appropriate, Effective, Safe, Accessible -Medications previously tried/failed: fluoxetine -PHQ9: 0 -GAD7: n/a -Educated on Benefits of medication for symptom control -Recommended to continue current medication  Allergic rhinitis (Goal: minimize symptoms) -Controlled -Current treatment  Fluticasone (Flonase) 50mcg/ 36mnasal spray, 1 spray into both nostrils daily  - Appropriate, Effective, Safe, Accessible Loratadine 10mg,1 t67mt once daily - Appropriate, Effective, Safe, Accessible -Medications previously  tried: none  -Recommended to continue current medication  GERD (Goal: minimize symptoms) -Controlled -Current treatment  Omeprazole 22m, 1 capsule twice daily - Appropriate, Effective, Safe, Accessible -Medications previously tried: none  -Recommended to continue current medication Patient is taking for stomach pain and reports it is helping now with twice daily  Bone health (Goal prevent fractures) -Controlled -Last DEXA Scan: 04/08/2014  T-Score femoral neck: 0.85  T-Score total hip: n/a  T-Score lumbar spine: n/a  T-Score forearm radius: n/a  10-year probability of major osteoporotic fracture: 5.9%  10-year probability of hip fracture: 0.2% -Patient is not a candidate for pharmacologic treatment -Current treatment  Calcium carbonate/ vitamin D, 1 tablet at bedtime - Appropriate, Effective, Safe, Accessible Cholecalciferol (1283m)  5000 units, 1 tablet every evening - Appropriate, Effective, Safe, Accessible -Medications previously tried: none  -Recommend [phone] units of  vitamin D daily. Recommend 1200 mg of calcium daily from dietary and supplemental sources. Recommend weight-bearing and muscle strengthening exercises for building and maintaining bone density. -Recommended to continue current medication  Health Maintenance -Vaccine gaps: Shingrix -Current therapy:  APAP 50073m2 in the AM, and 2 in the PM  Lysine 1000m57m tablet at bedtime  Multivitamin, 1 tablet once daily  Systane, 1 drop into both eyes twice daily as needed Valacyclovir 1000mg17mtablet every twelve hours as needed for fever blisters/ cold sores  -Educated on Cost vs benefit of each product must be carefully weighed by individual consumer -Patient is satisfied with current therapy and denies issues -Recommended to continue current medication  Patient Goals/Self-Care Activities Patient will:  - take medications as prescribed check glucose daily, document, and provide at future appointments engage in dietary modifications by following healthy plate model  Follow Up Plan: Face to Face appointment with care management team member scheduled for: 1 month          Medication Assistance: None required.  Patient affirms current coverage meets needs.  Compliance/Adherence/Medication fill history: Care Gaps: Shingrix - 2nd dose, colonoscopy, eye exam, urine microalbumin, tetanus, foot exam BP-154/66 ( 01/16/22) A1c - 7.7% (12/26/21)  Star-Rating Drugs: Metformin (Glucophage) 500 mg - Last filled 01/12/22 90 DS at [facility] Empagliflozin (Jardiance) 25 mg - Last filled 11/17/21 100 DS at [facility] Atorvastatin (Lipitor) 40 mg - Last filled 12/07/21 100 DS at [facility]  Patient's preferred pharmacy is:  [id number] [address], [place] - [address] [address]: [phone] 382 [address]CaremaLehi OOutagamiegistered Caremark Sites [address] [place] [address]: [phone] [place] [address] [place] LineLAWNDAM[place] [address]: [phone] Home Delivery ([facility]) - [place] [id number]: [phone] [facility] [address] [address]: [phone] - [place] [address] [place] [address]: [phone] pill box? Yes - patient uses weekly pill box for 2 weeks Pt endorses 100% compliance  We discussed: Benefits of medication synchronization, packaging and delivery as well as enhanced pharmacist oversight with [facility]. Patient decided to: Continue current medication management strategy  Care Plan and Follow Up  Patient Decision:  Patient agrees to Care Plan and Follow-up.  Plan: Face to Face appointment with care management team member scheduled for: 1 month  [name], PharmD [place] Pharmacist [place] [facility] [place] [phone]

## 2022-02-14 NOTE — Patient Instructions (Signed)
Go ahead and increase Tresiba to 12 units tomorrow and keep increasing it every 2-3 days and until your fasting blood sugars in the morning are around 140 and less  Maddie [name], PharmD, [place] [facility] [place]

## 2022-02-17 ENCOUNTER — Telehealth: Payer: Self-pay | Admitting: Cardiology

## 2022-02-17 MED ORDER — POTASSIUM CHLORIDE CRYS ER 20 MEQ PO TBCR: 20.0000 meq | EXTENDED_RELEASE_TABLET | Freq: Every day | ORAL | 0 refills | Status: DC

## 2022-02-17 MED ORDER — TORSEMIDE 20 MG PO TABS: 60.0000 mg | ORAL_TABLET | Freq: Every day | ORAL | 0 refills | Status: DC

## 2022-02-17 MED ORDER — SPIRONOLACTONE 25 MG PO TABS: 12.5000 mg | ORAL_TABLET | Freq: Every day | ORAL | 0 refills | Status: DC

## 2022-02-17 NOTE — Telephone Encounter (Signed)
Pt's medications were sent to pt's pharmacy as requested. Confirmation received.  

## 2022-02-17 NOTE — Telephone Encounter (Signed)
*  STAT* If patient is at the pharmacy, call can be transferred to refill team.   1. Which medications need to be refilled? (please list name of each medication and dose if known)  Potasium Chloride, Torsemide and Spironolactone  2. Which pharmacy/location (including street and city if local pharmacy) is medication to be sent to?[facility]  3. Do they need a 30 day or 90 day supply? 30 days and refills

## 2022-02-23 NOTE — Progress Notes (Signed)
Cardiology Office Note:    Date:  02/24/2022   ID:  [name], DOB [birth year], MRN [id number]  PCP:  [name], MD   [place] Prov[facility] Cardiologist:  [name], MD Electrophysiologist:  Constance Haw, MD {  Referring MD: Conrad Windham, NP    History of Present Illness:    [name] is a 76 y.o. female with a hx of chronic diastolic CHF, CAD s/p BMS RCA in 2004, paroxysmal atrial fibrillation s/p AF ablation X 3 and multiple cardioversions on dofetilide, tachybrady syndrome s/p PPM, OSA, obesity DM II, CKD IIIb who was previously followed by Dr. Acie Fredrickson who now presents to clinic for follow-up.  Per review of the record, the patient underwent RHC in 2018 to evaluate dyspnea which showed. RA mean 13, PA 76/29 (48), PCWP mean 33, LVEDP 21, Fick CO/CI 8.47/3.87, PVR 3 WU. Due to discrepancy in PCWP and LVEDP, CTA heart was recommended to rule out pulmonary vein stenosis given history of PV isolation. Cardiac CTA with no evidence of pulmonary vein stenosis but did show atypical left pulmonary vein drainage into left atrium.  She was hospitalized in 05/2020 for respiratory failure secondary to RSV and bronchitis. Course was complicated by Afib with RV. Tikosyn was continued and metop was added. She returned to clinic and was in [place]. She then underwent a fourth Aflutter ablation and DCCV on 07/07/20.  She was readmitted in 12/2021 for acute hypoxic respiratory failure due to acute on chronic HFpEF and possible ILD/mized WHO group II/III PH. She was diuresed with IV lasix and transitioned to PO torsemide.  Echo EF 60-65%, RV okay, no significant valvular disease. HRCT did show ground glass opacities and air trapping also noted on prior scans. Pulmonary consulted. Some findings on imaging appeared suggestive of chronic hypersensitivity pneumonitis. Outpatient follow-up with pulmonary recommended at discharge. Discharged with supplemental 2L O2.   Had repeat  RHC on 01/04/22 which showed elevated filling pressures. Torsemide was increased to '60mg'$  daily.   Was last seen in clinic by Darrick Grinder, NP on 01/16/22 where she continued to have DOE. Was on 3L [place]. Weight 239-241lbs.  Today, the patient overall feels well. No chest pain, orthopnea, or PND. Has occasional LE edema/SOB and will take metolazone at that time with resolution of symptoms. DOE has been stable. Wt is down to 233lbs at home and has been stable with no significant changes. Blood pressure is controlled with no readings with SBP<100. Occasional dizziness with position changes but otherwise no orthostatic symptoms. Tolerating all medications. Has occasional intermittent palpitations with last episode at the beach last week that lasted 39mn before resolving. No sustained episodes.   Past Medical History:  Diagnosis Date   Anticoagulant long-term use    pradaxa   Anxiety    Arthritis    "fingers, lower back" (04/23/2017    CAD (coronary artery disease) [id number]  post PTCA with bare-metal stenting to mid RCA in December 2004      CHF (congestive heart failure) (HCC)    Chronic atrial fibrillation ([place] 06/2007   Tachybradycardia pacemaker   Chronic kidney disease    10% function - ?R, other kidney is compensating     CVA (cerebral vascular accident) ([facility] [id number]  denies residual on 04/23/2017   Depression    Diplopia 06/19/2008   Qualifier: Diagnosis of  By: PRegis BillMD, WStandley Brooking   Dysrhythmia    ATRIAL FIBRILATION   Edema of lower extremity  Hyperlipidemia    Hypertension    Inferior myocardial infarction [facility])    acute inferior wall mi/other medical hx   Myocardial infarction [facility]) [phone]   Obesity    OSA on CPAP    last test- 2010   Pacemaker    Pneumonia 2014   tx. ----  [facility]   Pulmonary hypertension ([place])    moderate pulmonary hypertension by 10/2016 echo and 10/2013 cardiac cath   Shortness of breath    Skin cancer    "cut off right [place]; burned  off LLE" (04/23/2017)   Sleep apnea    Spondylolisthesis    TIA (transient ischemic attack) 2008   Unspecified hemorrhoids without mention of complication 08/28/3333   Colonoscopy--Dr. Carlean Purl     Past Surgical History:  Procedure Laterality Date   [place] N/A 09/29/2016   Procedure: Atrial Fibrillation Ablation;  Surgeon: Will Meredith Leeds, MD;  Location: [place] CV LAB;  Service: Cardiovascular;  Laterality: N/A;   ATRIAL FIBRILLATION ABLATION N/A 02/07/2018   Procedure: ATRIAL FIBRILLATION ABLATION;  Surgeon: Constance Haw, MD;  Location: [place] CV LAB;  Service: Cardiovascular;  Laterality: N/A;   ATRIAL FIBRILLATION ABLATION N/A 03/13/2019   Procedure: ATRIAL FIBRILLATION ABLATION;  Surgeon: Constance Haw, MD;  Location: [place] CV LAB;  Service: Cardiovascular;  Laterality: N/A;   ATRIAL FIBRILLATION ABLATION N/A 07/07/2020   Procedure: ATRIAL FIBRILLATION ABLATION;  Surgeon: Constance Haw, MD;  Location: [place] CV LAB;  Service: Cardiovascular;  Laterality: N/A;   BACK SURGERY     CARDIOVERSION N/A 09/12/2017   Procedure: CARDIOVERSION;  Surgeon: Jerline Pain, MD;  Location: [facility] ENDOSCOPY;  Service: Cardiovascular;  Laterality: N/A;   CARDIOVERSION N/A 12/13/2017   Procedure: CARDIOVERSION;  Surgeon: Sanda Klein, MD;  Location: [place] ENDOSCOPY;  Service: Cardiovascular;  Laterality: N/A;   CARDIOVERSION N/A 02/18/2018   Procedure: CARDIOVERSION;  Surgeon: Dorothy Spark, MD;  Location: [facility] ENDOSCOPY;  Service: Cardiovascular;  Laterality: N/A;   CARDIOVERSION N/A 05/20/2018   Procedure: CARDIOVERSION;  Surgeon: [name], MD;  Location: [facility] ENDOSCOPY;  Service: Cardiovascular;  Laterality: N/A;   CARDIOVERSION N/A 04/16/2019   Procedure: CARDIOVERSION;  Surgeon: Donato Heinz, MD;  Location: [place];  Service: Endoscopy;  Laterality: N/A;   CARDIOVERSION  N/A 01/22/2020   Procedure: CARDIOVERSION;  Surgeon: Geralynn Rile, MD;  Location: [place];  Service: Cardiovascular;  Laterality: N/A;   CARDIOVERSION N/A 02/18/2020   Procedure: CARDIOVERSION;  Surgeon: Elouise Munroe, MD;  Location: [facility] ENDOSCOPY;  Service: Cardiovascular;  Laterality: N/A;   CATARACT EXTRACTION W/ INTRAOCULAR LENS  IMPLANT, BILATERAL Bilateral 01/15/2017- 03/2017   CHOLECYSTECTOMY     CORONARY ANGIOPLASTY  X 2   CORONARY ANGIOPLASTY WITH [place]; ~ 2007; ?date   "1 stent; replaced stent; not sure when I got the last stent" (04/23/2017)   DOPPLER ECHOCARDIOGRAPHY  2009   ELECTROPHYSIOLOGIC STUDY N/A 03/31/2016   Procedure: Cardioversion;  Surgeon: Evans Lance, MD;  Location: [place] CV LAB;  Service: Cardiovascular;  Laterality: N/A;   ELECTROPHYSIOLOGIC STUDY N/A 08/04/2016   Procedure: Cardioversion;  Surgeon: Evans Lance, MD;  Location: [place] CV LAB;  Service: Cardiovascular;  Laterality: N/A;   INSERT / REPLACE / REMOVE PACEMAKER  06/2007   IR RADIOLOGY PERIPHERAL GUIDED IV START  01/31/2018   IR US GUIDE VASC ACCESS LEFT  01/31/2018   JOINT  REPLACEMENT     LAPAROSCOPIC CHOLECYSTECTOMY  1994   LEFT AND RIGHT HEART CATHETERIZATION WITH CORONARY ANGIOGRAM N/A 10/06/2013   Procedure: LEFT AND RIGHT HEART CATHETERIZATION WITH CORONARY ANGIOGRAM;  Surgeon: Troy Sine, MD;  Location: [facility] CATH LAB;  Service: Cardiovascular;  Laterality: N/A;   LEFT OOPHORECTOMY Left ~ [place]  2000s - 04/2015 X 3   L3-4; L4-5; L2-3; Dr Trenton Gammon   RIGHT HEART CATH N/A 01/04/2022   Procedure: RIGHT HEART CATH;  Surgeon: Larey Dresser, MD;  Location: [place] CV LAB;  Service: Cardiovascular;  Laterality: N/A;   RIGHT/LEFT HEART CATH AND CORONARY ANGIOGRAPHY N/A 05/09/2017   Procedure: RIGHT/LEFT HEART CATH AND CORONARY ANGIOGRAPHY;  Surgeon: Sherren Mocha, MD;  Location: [place] CV LAB;  Service: Cardiovascular;  Laterality:  N/A;   SKIN CANCER EXCISION Right    [place]   TEE WITHOUT CARDIOVERSION N/A 09/29/2016   Procedure: TRANSESOPHAGEAL ECHOCARDIOGRAM (TEE);  Surgeon: Jerline Pain, MD;  Location: [facility] ENDOSCOPY;  Service: Cardiovascular;  Laterality: N/A;   TOTAL ABDOMINAL HYSTERECTOMY  1984   "uterus & right ovary"   TOTAL KNEE ARTHROPLASTY Left 04/23/2017   TOTAL KNEE ARTHROPLASTY Left 04/23/2017   Procedure: TOTAL KNEE ARTHROPLASTY;  Surgeon: Frederik Pear, MD;  Location: [place];  Service: Orthopedics;  Laterality: Left;   ULTRASOUND GUIDANCE FOR VASCULAR ACCESS  05/09/2017   Procedure: Ultrasound Guidance For Vascular Access;  Surgeon: Sherren Mocha, MD;  Location: [place] CV LAB;  Service: Cardiovascular;;    Current Medications: Current Meds  Medication Sig   apixaban (ELIQUIS) 5 MG TABS tablet Take 1 tablet (5 mg total) by mouth 2 (two) times daily.   atorvastatin (LIPITOR) 40 MG tablet Take 1 tablet (40 mg total) by mouth daily. KEEP UPCOMING APPOINTMENT FOR FUTURE REFILL.   Continuous Blood Gluc Receiver (DEXCOM G7 RECEIVER) DEVI Use as directed while on insulin   Continuous Blood Gluc Receiver (FREESTYLE LIBRE 14 DAY READER) DEVI Use   14 days for diabetes control.   Continuous Blood Gluc Sensor (DEXCOM G7 SENSOR) MISC Use as directed while on insulin   diltiazem (CARDIZEM LA) 360 MG 24 hr tablet Take 1 tablet (360 mg total) by mouth daily. KEEP UPCOMING APPOINTMENT FOR FUTURE REFILLS.   dofetilide (TIKOSYN) 250 MCG capsule Take 1 capsule (250 mcg total) by mouth 2 (two) times daily. KEEP UPCOMING APPOINTMENT FOR FUTURE REFILLS.   DULoxetine (CYMBALTA) 60 MG capsule Take 1 capsule (60 mg total) by mouth daily.   empagliflozin (JARDIANCE) 25 MG TABS tablet Take 1 tablet (25 mg total) by mouth daily.   fluticasone (FLONASE) 50 MCG/ACT nasal spray Place 1 spray into both nostrils daily as needed for allergies.   insulin degludec (TRESIBA) 100 UNIT/ML FlexTouch Pen Inject 10 Units into the skin daily.    loratadine (CLARITIN) 10 MG tablet TAKE 1 TABLET BY MOUTH EVERY DAY   Lysine 1000 MG TABS Take 1,000 mg by mouth at bedtime.    metFORMIN (GLUCOPHAGE) 1000 MG tablet Take 1 tablet (1,000 mg total) by mouth 2 (two) times daily.   metolazone (ZAROXOLYN) 2.5 MG tablet TAKE 1 TABLET BY MOUTH EVERY 4  DAYS AS NEEDED FOR SWELLING  PLEASE KEEP UPCOMING APPOINTMENT FURTHER REFILLS   metoprolol tartrate (LOPRESSOR) 50 MG tablet Take 1 tablet by mouth in the morning and 2 tablets in the evening each day   Multiple Vitamin (MULTIVITAMIN WITH MINERALS) TABS tablet Take 1 tablet by mouth daily. Centrum   nitroGLYCERIN (  NITROSTAT) 0.4 MG SL tablet Place 1 tablet (0.4 mg total) under the tongue every 5 (five) minutes x 3 doses as needed for chest pain. PLEASE CALL [phone] AND SCHEDULE AN OVERDUE APPT WITH DR Acie Fredrickson FOR FURTHER REFILLS TO BE GRANTED (2ND ATTEMPT)   NON FORMULARY CPAP at bedtime   nystatin-triamcinolone (MYCOLOG II) cream Apply 1 application topically 2 (two) times daily as needed.   omeprazole (PRILOSEC) 20 MG capsule Take 20 mg by mouth 2 (two) times daily.   Polyethyl Glycol-Propyl Glycol (SYSTANE) 0.4-0.3 % SOLN Place 1 drop into both eyes at bedtime.   potassium chloride SA (KLOR-CON M) 20 MEQ tablet Take 1 tablet (20 mEq total) by mouth daily.   spironolactone (ALDACTONE) 25 MG tablet Take 0.5 tablets (12.5 mg total) by mouth daily.   torsemide (DEMADEX) 20 MG tablet Take 3 tablets (60 mg total) by mouth daily.   valACYclovir (VALTREX) 1000 MG tablet TAKE 2 TABLETS BY MOUTH EVERY 12 HOURS AS NEEDED     Allergies:   Hydrocodone-guaifenesin, Adhesive [tape], and Pedi-pre tape spray [wound dressing adhesive]   Social History   Socioeconomic History   Marital status: Married    Spouse name: Diplomatic Services operational officer   Number of children: 0   Years of education: HS   Highest education level: High school graduate  Occupational History   Occupation: retired    Comment: previously worked [employer]  Tobacco Use   Smoking status: Former    Packs/day: 1.00    Years: 5.00    Total pack years: 5.00    Types: Cigarettes    Start date: 05/11/1978    Quit date: 07/03/1982    Years since quitting: 39.6   Smokeless tobacco: Never  Vaping Use   Vaping Use: Never used  Substance and Sexual Activity   Alcohol use: Yes   Drug use: No   Sexual activity: Not Currently  Other Topics Concern   Not on file  Social History Narrative   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   Married    Originally from [place] of two, high school education   Former smoker 1979-1984 1ppd   Hunting dogs 7    Retired from [employer] 2    [place] Strain: [place]  (02/08/2022)   Overall Financial Resource Strain (CARDIA)    Difficulty of Paying Living Expenses: Not very hard  Food Insecurity: No Food Insecurity (01/12/2022)   Hunger Vital Sign    Worried About Running Out of Food in the Last Year: Never true    [place] in the Last Year: Never true  Transportation Needs: No Transportation Needs (01/12/2022)   PRAPARE - Hydrologist (Medical): No    Lack of Transportation (Non-Medical): No  Physical Activity: Inactive (01/12/2022)   Exercise Vital Sign    Days of Exercise per Week: 0 days    Minutes of Exercise per Session: 0 min  Stress: No Stress Concern Present (01/12/2022)   [place]    Feeling of Stress : Not at all  Social Connections: [place] (01/12/2022)   Social Connection and Isolation Panel [NHANES]    Frequency of Communication with Friends and Family: More than three times a week    Frequency of Social Gatherings with Friends and Family: More than three times a week  Attends Religious Services: More than 4 times per year    Active Member of Clubs or Organizations: Yes     Attends Music therapist: More than 4 times per year    Marital Status: Married     Family History: The patient's family history includes Arrhythmia in her mother; Colon cancer in her maternal aunt; Dementia in her mother; Diabetes in her mother and paternal grandfather; Heart attack in her brother; Heart disease in her paternal aunt; Hypertension in her mother; Prostate cancer in her maternal grandfather; Suicidality in her father.  ROS:   Please see the history of present illness.     All other systems reviewed and are negative.  EKGs/Labs/Other Studies Reviewed:    The following studies were reviewed today: RHC 2022/01/26 RA mean 14 RV 68/18 PA 63/24, mean 45 PCWP mean 25 with v-waves to 35 (prominent) Oxygen saturations: PA 71% AO 92% Cardiac Output (Fick) 7.7  Cardiac Index (Fick) 3.5 PVR 2.6 WU PAPi 2.8  TTE 12/10/21: IMPRESSIONS   1. Left ventricular ejection fraction, by estimation, is 60 to 65%. The  left ventricle has normal function. The left ventricle has no regional  wall motion abnormalities. Left ventricular diastolic parameters are  indeterminate.   2. Right ventricular systolic function is normal. The right ventricular  size is normal.   3. The mitral valve is normal in structure. Trivial mitral valve  regurgitation. No evidence of mitral stenosis.   4. The aortic valve is tricuspid. Aortic valve regurgitation is not  visualized. No aortic stenosis is present.   5. The inferior vena cava is normal in size with greater than 50%  respiratory variability, suggesting right atrial pressure of 3 mmHg.   EKG:  EKG not ordered today  Recent Labs: 07/08/2021: NT-Pro BNP 523 08/23/2021: TSH 4.61 12/15/2021: Magnesium 2.7 12/16/2021: Platelets 255 12/26/2021: ALT 46; B Natriuretic Peptide 93.2 2022-01-26: Hemoglobin 14.3; Hemoglobin 14.6 01/16/2022: BUN 18; Creatinine, Ser 1.42; Potassium 4.5; Sodium 138  Recent Lipid Panel    Component Value Date/Time    CHOL 147 08/23/2021 0935   CHOL 116 10/16/2016 0850   TRIG 206.0 (H) 08/23/2021 0935   HDL 47.20 08/23/2021 0935   HDL 42 10/16/2016 0850   CHOLHDL 3 08/23/2021 0935   VLDL 41.2 (H) 08/23/2021 0935   LDLCALC 68 04/10/2019 0801   LDLCALC 46 10/16/2016 0850   LDLDIRECT 76.0 08/23/2021 0935     Risk Assessment/Calculations:    CHA2DS2-VASc Score = 7   This indicates a 11.2% annual risk of stroke. The patient's score is based upon: CHF History: 1 HTN History: 1 Diabetes History: 1 Stroke History: 0 Vascular Disease History: 1 Age Score: 2 Gender Score: 1        Physical Exam:    VS:  BP (!) 102/58   Pulse (!) 57   Ht 5' 6.5" (1.689 m)   Wt 237 lb 9.6 oz (107.8 kg)   SpO2 92%   BMI 37.78 kg/m     Wt Readings from Last 3 Encounters:  02/24/22 237 lb 9.6 oz (107.8 kg)  01/16/22 242 lb 12.8 oz (110.1 kg)  01/12/22 240 lb (108.9 kg)     GEN:  Comfortable, on chronic O2 HEENT: Normal NECK: No JVD; No carotid bruits CARDIAC: RRR, no murmurs.  RESPIRATORY:  Faint expiratory wheezing at right lung base. Otherwise clear ABDOMEN: Soft, non-tender, non-distended MUSCULOSKELETAL:  No edema; No deformity  SKIN: Warm and dry NEUROLOGIC:  Alert and oriented x 3 PSYCHIATRIC:  Normal affect   ASSESSMENT:    1. Pulmonary hypertension ([place])   2. Chronic diastolic congestive heart failure ([place])   3. Chronic respiratory failure with hypoxia (HCC)   4. Coronary artery disease involving native coronary artery of native heart without angina pectoris   5. Paroxysmal atrial fibrillation (HCC)   6. OSA (obstructive sleep apnea)   7. Cardiac pacemaker in situ    PLAN:    In order of problems listed above:  #Chronic diastolic CHF: - Last TTE 73/7106 with EF 60-65%, normal RV, unable to assess PASP - RHC 12/2021: RAP 14, PA mean 54mHg, PCWP 237mg, CI 3.55, CO 7.74, PVR 2.6 WU - Currently compensated and euvolemic on exam with NYHA class III symptoms - Continue torsemide '60mg'$   daily and metolazone 2.'5mg'$  as needed for weight gain - Continue potassium supplementation; takes extra on days she takes metolazone - Continue jardiance 25 mg daily for underlying HF and DM - Continue metoprolol tartrate 50 BID - Continue spiro 12.5 mg daily  -Low Na diet - Monitor weights   #Pulmonary hypertension - Suspect mixed Group II/III in the setting of chronic diastolic HF, ILD, hypersensitivity pneumonitis, OSA/OHS - RHC 12/2021: RAP 14, PA mean 452m, PCWP 89m43m CI 3.55, CO 7.74, PVR 2.6 WU - TTE 12/2021 with normal RV - Followed by Dr. RamaChase CallerChronic respiratory failure with hypoxia - Suspect multifactorial, combination of chronic diastolic CHF, underlying pulmonary disease, OSA and OHS - Has been on 3L O2  - Follows with pulm as above   #Coronary artery disease - S/p BMS RCA in 2004 - [facility] [place]8: patent stent RCA, CTO small acute marginal branch with collaterals, LAD and Lcx patent - No chest apin - On atorvastatin 40 daily - No aspirin with anticoagulation   #Atrial fibrillation/flutter - Histoy of Afib ablationx3 and Aflutter ablation in 2022 - Continue Tikosyn 250 mcg BID, Metoprolol tartrate 50 BID and diltiazem 360 mg daily - Continue Eliquis '5mg'$  BID - Followed by EP   #Tachybrady syndrome - S/p PPM - Follow-up with EP as scheduled   #OSA - On CPAP   #Possible liver cirrhosis - Nodularity of liver noted on CT abdomen pelvis 02/23 and HR CT 06/23 - Follow-up with PCP as scheduled           Medication Adjustments/Labs and Tests Ordered: Current medicines are reviewed at length with the patient today.  Concerns regarding medicines are outlined above.  No orders of the defined types were placed in this encounter.  No orders of the defined types were placed in this encounter.   Patient Instructions  Medication Instructions:  Your physician recommends that you continue on your current medications as directed. Please refer to the Current  Medication list given to you today.  *If you need a refill on your cardiac medications before your next appointment, please call your pharmacy*   Lab Work: None If you have labs (blood work) drawn today and your tests are completely normal, you will receive your results only by: [place] you have MyChart) OR A paper copy in the mail If you have any lab test that is abnormal or we need to change your treatment, we will call you to review the results.   Follow-Up: [facility] and your health needs are our priority.  As part of our continuing mission to provide you with exceptional heart care, we have created designated Provider Care Teams.  These Care Teams include your primary Cardiologist (physician)  and Advanced Practice Providers (APPs -  Physician Assistants and Nurse Practitioners) who all work together to provide you with the care you need, when you need it.  Your next appointment:   6 month(s)  The format for your next appointment:   In Person  Provider:   [name], MD {  Important Information About [place]         Signed, [name], MD  02/24/2022 11:31 AM    [place]

## 2022-02-24 ENCOUNTER — Ambulatory Visit: Payer: Medicare Other | Admitting: Cardiology

## 2022-02-24 ENCOUNTER — Encounter: Payer: Self-pay | Admitting: Cardiology

## 2022-02-24 VITALS — BP 102/58 | HR 57 | Ht 66.5 in | Wt 237.6 lb

## 2022-02-24 DIAGNOSIS — Z950 Presence of cardiac pacemaker: Secondary | ICD-10-CM

## 2022-02-24 DIAGNOSIS — I272 Pulmonary hypertension, unspecified: Secondary | ICD-10-CM | POA: Diagnosis not present

## 2022-02-24 DIAGNOSIS — G4733 Obstructive sleep apnea (adult) (pediatric): Secondary | ICD-10-CM | POA: Diagnosis not present

## 2022-02-24 DIAGNOSIS — I5032 Chronic diastolic (congestive) heart failure: Secondary | ICD-10-CM | POA: Diagnosis not present

## 2022-02-24 DIAGNOSIS — I2510 Atherosclerotic heart disease of native coronary artery without angina pectoris: Secondary | ICD-10-CM

## 2022-02-24 DIAGNOSIS — J9611 Chronic respiratory failure with hypoxia: Secondary | ICD-10-CM | POA: Diagnosis not present

## 2022-02-24 DIAGNOSIS — I480 Paroxysmal atrial fibrillation: Secondary | ICD-10-CM | POA: Diagnosis not present

## 2022-02-24 NOTE — Patient Instructions (Signed)
Medication Instructions:  Your physician recommends that you continue on your current medications as directed. Please refer to the Current Medication list given to you today.  *If you need a refill on your cardiac medications before your next appointment, please call your pharmacy*   Lab Work: None If you have labs (blood work) drawn today and your tests are completely normal, you will receive your results only by: [place] (if you have MyChart) OR A paper copy in the mail If you have any lab test that is abnormal or we need to change your treatment, we will call you to review the results.   Follow-Up: At [facility], you and your health needs are our priority.  As part of our continuing mission to provide you with exceptional heart care, we have created designated Provider Care Teams.  These Care Teams include your primary Cardiologist (physician) and Advanced Practice Providers (APPs -  Physician Assistants and Nurse Practitioners) who all work together to provide you with the care you need, when you need it.  Your next appointment:   6 month(s)  The format for your next appointment:   In Person  Provider:   [name], MD {  Important Information About [place]

## 2022-02-27 ENCOUNTER — Other Ambulatory Visit: Payer: Medicare Other

## 2022-02-28 ENCOUNTER — Telehealth: Payer: Self-pay | Admitting: Internal Medicine

## 2022-02-28 DIAGNOSIS — G4733 Obstructive sleep apnea (adult) (pediatric): Secondary | ICD-10-CM

## 2022-03-02 DIAGNOSIS — E785 Hyperlipidemia, unspecified: Secondary | ICD-10-CM

## 2022-03-02 DIAGNOSIS — I110 Hypertensive heart disease with heart failure: Secondary | ICD-10-CM | POA: Diagnosis not present

## 2022-03-02 DIAGNOSIS — E1159 Type 2 diabetes mellitus with other circulatory complications: Secondary | ICD-10-CM

## 2022-03-02 DIAGNOSIS — I2510 Atherosclerotic heart disease of native coronary artery without angina pectoris: Secondary | ICD-10-CM

## 2022-03-02 DIAGNOSIS — I5030 Unspecified diastolic (congestive) heart failure: Secondary | ICD-10-CM | POA: Diagnosis not present

## 2022-03-02 NOTE — Telephone Encounter (Signed)
DME order placed.  Patient aware of order. Nothing further at this time.

## 2022-03-02 NOTE — Telephone Encounter (Signed)
Order- DME [facility]- please replace old CPAP machine (has "exceeded motor life"), changing to auto 5-15, mask of choice, humidifier, supplies, Airview/ card

## 2022-03-02 NOTE — Telephone Encounter (Signed)
Called and spoke with patient.  Patient stated her cpap is reading exceeded lifeline. Patient is requesting a new cpap and mask of choice to [facility].   Message routed to Dr. Annamaria Boots to advise

## 2022-03-03 ENCOUNTER — Telehealth: Payer: Medicare Other

## 2022-03-07 ENCOUNTER — Telehealth: Payer: Self-pay | Admitting: Pharmacist

## 2022-03-07 ENCOUNTER — Telehealth: Payer: Self-pay | Admitting: Internal Medicine

## 2022-03-07 DIAGNOSIS — M5136 Other intervertebral disc degeneration, lumbar region: Secondary | ICD-10-CM | POA: Diagnosis not present

## 2022-03-07 DIAGNOSIS — M9903 Segmental and somatic dysfunction of lumbar region: Secondary | ICD-10-CM | POA: Diagnosis not present

## 2022-03-07 DIAGNOSIS — J8410 Pulmonary fibrosis, unspecified: Secondary | ICD-10-CM | POA: Diagnosis not present

## 2022-03-07 DIAGNOSIS — J9601 Acute respiratory failure with hypoxia: Secondary | ICD-10-CM | POA: Diagnosis not present

## 2022-03-07 DIAGNOSIS — M9901 Segmental and somatic dysfunction of cervical region: Secondary | ICD-10-CM | POA: Diagnosis not present

## 2022-03-07 DIAGNOSIS — J849 Interstitial pulmonary disease, unspecified: Secondary | ICD-10-CM | POA: Diagnosis not present

## 2022-03-07 DIAGNOSIS — M50322 Other cervical disc degeneration at C5-C6 level: Secondary | ICD-10-CM | POA: Diagnosis not present

## 2022-03-07 NOTE — Chronic Care Management (AMB) (Signed)
Chronic Care Management Pharmacy Assistant   Name: [name]  MRN: [id number] DOB: Jan 27, 1946  03/07/22 APPOINTMENT REMINDER   Called Patient No answer, left message of appointment on 03/08/22 at 3 via office visit with [name], Pharm D.   Notified to have all medications, supplements, blood pressure and/or blood sugar logs available during appointment and to return call if need to reschedule.     Care Gaps: Urine Micro - Overdue Zoster Vaccine - Overdue Foot Exam - Overdue COVID Booster - Overdue Flu Vaccine - Overdue BP- 102/58 02/24/22 AWV- 7/23 Lab Results  Component Value Date   HGBA1C 7.7 (A) 12/26/2021    Star Rating Drug: Metformin (Glucophage) 500 mg - Last filled 01/12/22 90 DS at [facility] Empagliflozin (Jardiance) 25 mg - Last filled 02/08/22 30 Ds at [facility] Atorvastatin (Lipitor) 40 mg - Last filled 12/07/21 100 DS at [facility]   Medications: Outpatient Encounter Medications as of 03/07/2022  Medication Sig   apixaban (ELIQUIS) 5 MG TABS tablet Take 1 tablet (5 mg total) by mouth 2 (two) times daily.   atorvastatin (LIPITOR) 40 MG tablet Take 1 tablet (40 mg total) by mouth daily. KEEP UPCOMING APPOINTMENT FOR FUTURE REFILL.   Continuous Blood Gluc Receiver (DEXCOM G7 RECEIVER) DEVI Use as directed while on insulin   Continuous Blood Gluc Receiver (FREESTYLE LIBRE 14 DAY READER) DEVI Use   14 days for diabetes control.   Continuous Blood Gluc Sensor (DEXCOM G7 SENSOR) MISC Use as directed while on insulin   diltiazem (CARDIZEM LA) 360 MG 24 hr tablet Take 1 tablet (360 mg total) by mouth daily. KEEP UPCOMING APPOINTMENT FOR FUTURE REFILLS.   dofetilide (TIKOSYN) 250 MCG capsule Take 1 capsule (250 mcg total) by mouth 2 (two) times daily. KEEP UPCOMING APPOINTMENT FOR FUTURE REFILLS.   DULoxetine (CYMBALTA) 60 MG capsule Take 1 capsule (60 mg total) by mouth daily.   empagliflozin (JARDIANCE) 25 MG TABS tablet Take 1 tablet (25 mg total) by mouth daily.   fluticasone  (FLONASE) 50 MCG/ACT nasal spray Place 1 spray into both nostrils daily as needed for allergies.   insulin degludec (TRESIBA) 100 UNIT/ML FlexTouch Pen Inject 10 Units into the skin daily.   loratadine (CLARITIN) 10 MG tablet TAKE 1 TABLET BY MOUTH EVERY DAY   Lysine 1000 MG TABS Take 1,000 mg by mouth at bedtime.    metFORMIN (GLUCOPHAGE) 1000 MG tablet Take 1 tablet (1,000 mg total) by mouth 2 (two) times daily.   metolazone (ZAROXOLYN) 2.5 MG tablet TAKE 1 TABLET BY MOUTH EVERY 4  DAYS AS NEEDED FOR SWELLING  PLEASE KEEP UPCOMING APPOINTMENT FURTHER REFILLS   metoprolol tartrate (LOPRESSOR) 50 MG tablet Take 1 tablet by mouth in the morning and 2 tablets in the evening each day   Multiple Vitamin (MULTIVITAMIN WITH MINERALS) TABS tablet Take 1 tablet by mouth daily. Centrum   nitroGLYCERIN (NITROSTAT) 0.4 MG SL tablet Place 1 tablet (0.4 mg total) under the tongue every 5 (five) minutes x 3 doses as needed for chest pain. PLEASE CALL [phone] AND SCHEDULE AN OVERDUE APPT WITH DR Acie Fredrickson FOR FURTHER REFILLS TO BE GRANTED (2ND ATTEMPT)   NON FORMULARY CPAP at bedtime   nystatin-triamcinolone (MYCOLOG II) cream Apply 1 application topically 2 (two) times daily as needed.   omeprazole (PRILOSEC) 20 MG capsule Take 20 mg by mouth 2 (two) times daily.   Polyethyl Glycol-Propyl Glycol (SYSTANE) 0.4-0.3 % SOLN Place 1 drop into both eyes at bedtime.   potassium  chloride SA (KLOR-CON M) 20 MEQ tablet Take 1 tablet (20 mEq total) by mouth daily.   spironolactone (ALDACTONE) 25 MG tablet Take 0.5 tablets (12.5 mg total) by mouth daily.   torsemide (DEMADEX) 20 MG tablet Take 3 tablets (60 mg total) by mouth daily.   valACYclovir (VALTREX) 1000 MG tablet TAKE 2 TABLETS BY MOUTH EVERY 12 HOURS AS NEEDED   No facility-administered encounter medications on file as of 03/07/2022.      [facility] Clinical Pharmacist Assistant [phone]

## 2022-03-08 ENCOUNTER — Ambulatory Visit (INDEPENDENT_AMBULATORY_CARE_PROVIDER_SITE_OTHER): Payer: Medicare Other | Admitting: Pharmacist

## 2022-03-08 ENCOUNTER — Ambulatory Visit: Payer: Medicare Other | Admitting: Internal Medicine

## 2022-03-08 ENCOUNTER — Other Ambulatory Visit: Payer: Self-pay | Admitting: Cardiovascular Disease

## 2022-03-08 DIAGNOSIS — E1165 Type 2 diabetes mellitus with hyperglycemia: Secondary | ICD-10-CM

## 2022-03-08 DIAGNOSIS — I5032 Chronic diastolic (congestive) heart failure: Secondary | ICD-10-CM

## 2022-03-08 DIAGNOSIS — I10 Essential (primary) hypertension: Secondary | ICD-10-CM

## 2022-03-08 NOTE — Progress Notes (Signed)
Chronic Care Management Pharmacy Note  03/08/2022 Name:  [name] MRN:  [id number] DOB:  12/11/1945  Summary: A1c not at goal of < 7.5% but CGM report demonstrated improvement in blood sugars Pt is currently on Tresiba 28 units Pt is having issues canceling mail order prescriptions   Recommendations/Changes made from today's visit: -Recommended continuing increasing Tresiba by 2 units every 2-3 days to target fasting BGs < 140 -Requested prescription for updated insulin dose   Plan: Follow up with PCP in 2 weeks  Subjective: [name] is an 76 y.o. year old female who is a primary patient of [name], [name], MD.  The CCM team was consulted for assistance with disease management and care coordination needs.    Engaged with patient by telephone for follow up visit in response to provider referral for pharmacy case management and/or care coordination services.   Consent to Services:  The patient was given information about Chronic Care Management services, agreed to services, and gave verbal consent prior to initiation of services.  Please see initial visit note for detailed documentation.   Patient Care Team: [name], [name], MD as PCP - General (Internal Medicine) Constance Haw, MD as PCP - Electrophysiology (Cardiology) [name], MD as PCP - Cardiology (Cardiology) Gaynelle Arabian, MD as Consulting Physician (Orthopedic Surgery) Brand Males, MD as Consulting Physician (Pulmonary Disease) Viona Gilmore, [facility] as Pharmacist (Pharmacist)  Recent office visits: 01/12/22 Rolene Arbour, LPN: Patient presented for AWV.  12/26/21 [name], MD: Patient presented for hospital follow up.  08/23/21 [name], MD: Patient presented for diabetes follow- up and abdominal pain. A1c slightly decreased to 7.7%. Plan for CGM start and reached out to [facility] team for support.  Recent consult visits: 02/24/22 Gwyndolyn Kaufman, MD (cardiology): Patient presented  for pulmonary hypertension. Follow up in 6 months.   01/16/22 Patient presented for Heart & Vascular Transitions of Care appointment.   01/04/22 Patient presented for right heart cath.  12/29/21 Geraldo Pitter, NP (pulmonary): Patient presented for ILD and hospital follow up. Follow up in 3 months.  12/26/21 Marlyce Huge, PA-C (CHF clinic): Patient presented for hospital follow up. Decreased potassium to 20 mEq per day and started spironolactone 12.5 mg daily.   10/06/21 Tommye Standard, PA-C (cardiology): Patient presented for Afib follow up. Changed metoprolol to 50 mg in the AM and 100 mg in the PM. Follow up in 6 months.  Hospital visits: 6/10-6/16/23 Patient admitted to [facility] for CHF exacerbation. Prescribed metolazone for PRN use.  Objective:  Lab Results  Component Value Date   CREATININE 1.42 (H) 01/16/2022   BUN 18 01/16/2022   GFR 38.69 (L) 12/28/2021   GFRNONAA 39 (L) 01/16/2022   GFRAA 40 (L) 07/28/2020   NA 138 01/16/2022   K 4.5 01/16/2022   CALCIUM 9.6 01/16/2022   CO2 32 01/16/2022   GLUCOSE 165 (H) 01/16/2022    Lab Results  Component Value Date/Time   HGBA1C 7.7 (A) 12/26/2021 03:38 PM   HGBA1C 8.0 (H) 08/23/2021 09:35 AM   HGBA1C 8.2 (H) 04/20/2021 11:30 AM   GFR 38.69 (L) 12/28/2021 09:04 AM   GFR 42.98 (L) 08/23/2021 09:35 AM   MICROALBUR <0.7 01/23/2017 10:51 AM    Last diabetic Eye exam:  Lab Results  Component Value Date/Time   HMDIABEYEEXA No Retinopathy 07/05/2021 03:22 PM    Last diabetic Foot exam: No results found for: "HMDIABFOOTEX"   Lab Results  Component Value Date  CHOL 147 08/23/2021   HDL 47.20 08/23/2021   LDLCALC 68 04/10/2019   LDLDIRECT 76.0 08/23/2021   TRIG 206.0 (H) 08/23/2021   CHOLHDL 3 08/23/2021       Latest Ref Rng & Units 12/26/2021    9:56 AM 12/11/2021    3:19 AM 12/10/2021   12:22 AM  Hepatic Function  Total Protein 6.5 - 8.1 g/dL 7.5  6.9  6.9   Albumin 3.5 - 5.0 g/dL 3.9  3.4  3.6   AST  15 - 41 U/L 36  28  73   ALT 0 - 44 U/L 46  47  65   Alk Phosphatase 38 - 126 U/L 83  84  88   Total Bilirubin 0.3 - 1.2 mg/dL 0.7  1.1  1.1     Lab Results  Component Value Date/Time   TSH 4.61 08/23/2021 09:35 AM   TSH 4.37 04/20/2021 11:30 AM   FREET4 0.86 04/20/2021 11:30 AM   FREET4 0.83 05/06/2018 09:23 AM       Latest Ref Rng & Units 01/04/2022    8:11 AM 12/16/2021    3:09 AM 12/15/2021    4:03 AM  CBC  WBC 4.0 - 10.5 K/uL  8.0  8.4   Hemoglobin 12.0 - 15.0 g/dL 12.0 - 15.0 g/dL 14.3    14.6  13.1  13.4   Hematocrit 36.0 - 46.0 % 36.0 - 46.0 % 42.0    43.0  40.3  41.3   Platelets 150 - 400 K/uL  255  253     Lab Results  Component Value Date/Time   VD25OH 79.26 12/28/2021 09:04 AM   VD25OH 32.83 01/23/2017 10:51 AM    Clinical ASCVD: No  The 10-year ASCVD risk score (Arnett DK, et al., 2019) is: 27.1%   Values used to calculate the score:     Age: 32 years     Sex: Female     Is Non-Hispanic African American: No     Diabetic: Yes     Tobacco smoker: No     Systolic Blood Pressure: 355 mmHg     Is BP treated: Yes     HDL Cholesterol: 47.2 mg/dL     Total Cholesterol: 147 mg/dL       01/12/2022    9:50 AM 12/26/2021    2:46 PM 11/30/2020    8:50 AM  Depression screen PHQ 2/9  Decreased Interest 0 1 0  Down, Depressed, Hopeless 0 1 1  PHQ - 2 Score 0 2 1  Altered sleeping 0 2   Tired, decreased energy 0 1   Change in appetite 0 2   Feeling bad or failure about yourself  0 0   Trouble concentrating 0 1   Moving slowly or fidgety/restless 0 0   Suicidal thoughts 0 0   PHQ-9 Score 0 8   Difficult doing work/chores Not difficult at all Somewhat difficult      CHA2DS2/VAS Stroke Risk Points  Current as of about an hour ago     6 >= 2 Points: High Risk  1 - 1.99 Points: Medium Risk  0 Points: Low Risk    Last Change: N/A      Details    This score determines the patient's risk of having a stroke if the  patient has atrial fibrillation.        Points Metrics  1 Has Congestive Heart Failure:  Yes    Current as of about an hour ago  1 Has Vascular Disease:  Yes    Current as of about an hour ago  1 Has Hypertension:  Yes    Current as of about an hour ago  1 Age:  76    Current as of about an hour ago  1 Has Diabetes:  Yes    Current as of about an hour ago  0 Had Stroke:  No  Had TIA:  No  Had Thromboembolism:  No    Current as of about an hour ago  1 Female:  Yes    Current as of about an hour ago     Social History   Tobacco Use  Smoking Status Former   Packs/day: 1.00   Years: 5.00   Total pack years: 5.00   Types: Cigarettes   Start date: 05/11/1978   Quit date: 07/03/1982   Years since quitting: 39.7  Smokeless Tobacco Never   BP Readings from Last 3 Encounters:  02/24/22 (!) 102/58  01/16/22 (!) 154/66  01/12/22 120/62   Pulse Readings from Last 3 Encounters:  02/24/22 (!) 57  01/16/22 66  01/12/22 61   Wt Readings from Last 3 Encounters:  02/24/22 237 lb 9.6 oz (107.8 kg)  01/16/22 242 lb 12.8 oz (110.1 kg)  01/12/22 240 lb (108.9 kg)   BMI Readings from Last 3 Encounters:  02/24/22 37.78 kg/m  01/16/22 38.60 kg/m  01/12/22 38.16 kg/m    Assessment/Interventions: Review of patient past medical history, allergies, medications, health status, including review of consultants reports, laboratory and other test data, was performed as part of comprehensive evaluation and provision of chronic care management services.   SDOH:  (Social Determinants of Health) assessments and interventions performed: Yes SDOH Interventions    Flowsheet Row Chronic Care Management from 02/07/2022 in [place] at [place] Management from 01/17/2022 in [place] [facility] [place] from 01/12/2022 in [place] at [place] ED to Hosp-Admission (Discharged) from 12/10/2021 in [place] HF PCU Chronic Care Management from 11/17/2019 in [place] at  [place] Interventions -- -- Intervention Not Indicated Intervention Not Indicated --  Housing Interventions -- -- Intervention Not Indicated Intervention Not Indicated --  Transportation Interventions -- -- Intervention Not Indicated Intervention Not Indicated Intervention Not Indicated  Financial Strain Interventions Intervention Not Indicated Intervention Not Indicated, Other (Comment)  [patient denied patient assistance] Intervention Not Indicated Intervention Not Indicated --  Physical Activity Interventions -- -- Intervention Not Indicated -- --  Stress Interventions -- -- Intervention Not Indicated -- --  Social Connections Interventions -- -- Intervention Not Indicated -- --        SDOH Screenings   Food Insecurity: No Food Insecurity (01/12/2022)  Housing: Low Risk  (01/12/2022)  Transportation Needs: No Transportation Needs (01/12/2022)  Alcohol Screen: Low Risk  (01/12/2022)  Depression (PHQ2-9): Low Risk  (01/12/2022)  Recent Concern: Depression (PHQ2-9) - Medium Risk (12/26/2021)  Financial Resource Strain: Low Risk  (02/08/2022)  Physical Activity: Inactive (01/12/2022)  Social Connections: Socially Integrated (01/12/2022)  Stress: No Stress Concern Present (01/12/2022)  Tobacco Use: Medium Risk (02/24/2022)     [place]  Allergies  Allergen Reactions   Hydrocodone-Guaifenesin Other (See Comments)    Confusion/ memory loss   Adhesive [Tape] Rash and Other (See Comments)    Allergic to defibrillation pads.   Pedi-Pre Tape Spray [Wound Dressing Adhesive] Other (See Comments) and Rash    Allergic to  defibrillation pads.    Medications Reviewed Today     Reviewed by Viona Gilmore, [facility] (Pharmacist) on 03/08/22 at 1512  Med List Status: <None>   Medication Order Taking? Sig Documenting Provider Last Dose Status Informant  apixaban (ELIQUIS) 5 MG TABS tablet [id number] No Take 1 tablet (5 mg total) by mouth 2 (two) times  daily. [name], MD Taking Active   atorvastatin (LIPITOR) 40 MG tablet [id number] No Take 1 tablet (40 mg total) by mouth daily. KEEP UPCOMING APPOINTMENT FOR FUTURE REFILL. Nahser, Wonda Cheng, MD Taking Active   Continuous Blood Gluc Receiver ([place]) [place] [id number] No Use as directed while on insulin [name], [name], MD Taking Active   Continuous Blood Gluc Receiver (FREESTYLE LIBRE 14 DAY READER) [place]VI [id number] No Use   14 days for diabetes control. [name], [name], MD Taking Active   Continuous Blood Gluc Sensor (DEXCOM G7 SENSOR) MISC [id number] No Use as directed while on insulin [name], [name], MD Taking Active   diltiazem (CARDIZEM LA) 360 MG 24 hr tablet [id number] No Take 1 tablet (360 mg total) by mouth daily. KEEP UPCOMING APPOINTMENT FOR FUTURE REFILLS. Nahser, Wonda Cheng, MD Taking Active   dofetilide [facility]) 250 MCG capsule [id number] No Take 1 capsule (250 mcg total) by mouth 2 (two) times daily. KEEP UPCOMING APPOINTMENT FOR FUTURE REFILLS. Nahser, Wonda Cheng, MD Taking Active   DULoxetine (CYMBALTA) 60 MG capsule [id number] No Take 1 capsule (60 mg total) by mouth daily. [name], [name], MD Taking Active   empagliflozin (JARDIANCE) 25 MG TABS tablet [id number] No Take 1 tablet (25 mg total) by mouth daily. [name], [name], MD Taking Active   fluticasone [facility]) 50 MCG/ACT nasal spray [id number] No Place 1 spray into both nostrils daily as needed for allergies. [provider] Taking Active Self  insulin degludec (TRESIBA) 100 UNIT/ML FlexTouch Pen [id number] No Inject 10 Units into the skin daily. [name], [name], MD Taking Active   loratadine (CLARITIN) 10 MG tablet [id number] No TAKE 1 TABLET BY MOUTH EVERY DAY [name], [name], MD Taking Active Self  Lysine 1000 MG TABS [id number] No Take 1,000 mg by mouth at bedtime.  [provider] Taking Active Self  metFORMIN (GLUCOPHAGE) 1000 MG tablet [id number] No Take 1 tablet (1,000 mg total) by mouth 2 (two)  times daily. [name], [name], MD Taking Active   metolazone (ZAROXOLYN) 2.5 MG tablet [id number] No TAKE 1 TABLET BY MOUTH EVERY 4  DAYS AS NEEDED FOR SWELLING  PLEASE KEEP UPCOMING APPOINTMENT FURTHER REFILLS Nahser, Wonda Cheng, MD Taking Active   metoprolol tartrate (LOPRESSOR) 50 MG tablet [id number] No Take 1 tablet by mouth in the morning and 2 tablets in the evening each day Nahser, Wonda Cheng, MD Taking Active   Multiple Vitamin (MULTIVITAMIN WITH MINERALS) TABS tablet [id number] No Take 1 tablet by mouth daily. [place] [provider] Taking Active Self  nitroGLYCERIN (NITROSTAT) 0.4 MG SL tablet [id number] No Place 1 tablet (0.4 mg total) under the tongue every 5 (five) minutes x 3 doses as needed for chest pain. PLEASE CALL [phone] AND SCHEDULE AN OVERDUE APPT WITH DR Acie Fredrickson FOR FURTHER REFILLS TO BE GRANTED (2ND ATTEMPT) Nahser, Wonda Cheng, MD Taking Active Self  NON FORMULARY [id number] No CPAP at bedtime [provider] Taking Active Self  nystatin-triamcinolone (MYCOLOG II) cream [id number] No Apply 1 application topically 2 (two) times daily as needed. [name], [name], MD Taking Active Self  omeprazole (PRILOSEC) 20 MG  capsule [id number] No Take 20 mg by mouth 2 (two) times daily. [provider] Taking Active Self  Polyethyl Glycol-Propyl Glycol (SYSTANE) 0.4-0.3 % SOLN [id number] No Place 1 drop into both eyes at bedtime. [provider] Taking Active Self  potassium chloride SA (KLOR-CON M) 20 MEQ tablet [id number] No Take 1 tablet (20 mEq total) by mouth daily. [name], MD Taking Active   spironolactone (ALDACTONE) 25 MG tablet [id number] No Take 0.5 tablets (12.5 mg total) by mouth daily. [name], MD Taking Active   torsemide (DEMADEX) 20 MG tablet [id number] No Take 3 tablets (60 mg total) by mouth daily. [name], MD Taking Active   valACYclovir (VALTREX) 1000 MG tablet [id number] No TAKE 2 TABLETS BY MOUTH EVERY 12 HOURS  AS NEEDED [name], [name], MD Taking Active Self            Patient Active Problem List   Diagnosis Date Noted   ILD (interstitial lung disease) ([place])    Pulmonary fibrosis ([place])    SIRS (systemic inflammatory response syndrome) ([place]) 12/10/2021   Hyperkalemia 12/10/2021   Type 2 diabetes mellitus with hyperlipidemia ([place]) 05/22/2020   Acute respiratory failure ([place]) 05/19/2020   Hypokalemia 05/19/2020   Secondary hypercoagulable state ([place]) 02/16/2020   CHF exacerbation ([place]) 05/31/2018   Bronchitis 05/31/2018   Primary osteoarthritis of left knee 04/23/2017   Degenerative arthritis of left knee 04/19/2017   Acute on chronic diastolic CHF (congestive heart failure) ([place]) 11/12/2016   Atypical atrial flutter ([place]) 09/30/2016   Hematoma 09/30/2016   Hypotension 09/30/2016   Persistent atrial fibrillation ([place]) 09/29/2016   Hyperlipidemia    HLD (hyperlipidemia)    Controlled type 2 diabetes mellitus without complication (HCC)    Acute respiratory failure with hypoxia ([place]) 03/26/2016   DDD (degenerative disc disease), lumbar 04/05/2015   Degenerative disc disease, lumbar 04/05/2015   Severe obesity (BMI >= 40) ([place]) 02/16/2015   Fatty liver disease, nonalcoholic 73/22/0254   Cough, persistent 04/03/2014   Acute on chronic diastolic congestive heart failure ([place]) 10/07/2013   Pre-diabetes 09/24/2013   Peripheral edema 08/05/2013   Neoplasm of uncertain behavior of skin of back 11/09/2012   Edema 11/04/2012   Bleeding mole 11/04/2012   Pulmonary hypertension ([place]) 06/11/2012   Pneumonia 06/05/2012   Hypoxia 06/05/2012   Anemia 06/05/2012   Hemorrhoids 05/11/2012   Dyspepsia 05/11/2012   Medication management 04/20/2011   Positional vertigo 10/07/2010   Anticoagulant long-term use    HERPES LABIALIS 12/30/2009   INTERTRIGO 12/30/2009   Class 2 obesity 06/02/2009   Obstructive sleep apnea 10/12/2008   HYPERLIPIDEMIA 09/15/2008   SNORING 09/15/2008   Essential  hypertension 06/19/2008   CAD S/P percutaneous coronary angioplasty 06/19/2008   Paroxysmal atrial fibrillation ([place]) 06/19/2008   BRADYCARDIA-TACHYCARDIA SYNDROME 06/19/2008   CEREBRAL ANEURYSM 06/19/2008   Allergic rhinitis 06/19/2008   HYPERGLYCEMIA 06/19/2008   DEPRESSION, HX OF 06/19/2008   CEREBROVASCULAR ACCIDENT, HX OF 06/19/2008   PACEMAKER, PERMANENT 06/19/2008    Immunization History  Administered Date(s) Administered   Fluad Quad(high Dose 65+) 04/10/2019, 04/20/2021   Influenza Split 04/18/2011, 05/06/2012   Influenza Whole 07/04/2007, 06/02/2009, 03/01/2010   Influenza, High Dose Seasonal PF 04/03/2014, 04/18/2016, 04/05/2017, 05/06/2018   Influenza,inj,Quad PF,6+ Mos 03/07/2013, 04/06/2015   PFIZER(Purple Top)SARS-COV-2 Vaccination 07/22/2019, 08/10/2019, 03/29/2020   PNEUMOCOCCAL CONJUGATE-20 04/22/2021   Pfizer Covid-19 Vaccine Bivalent Booster 14yr & up 04/22/2021   Pneumococcal Conjugate-13 09/24/2013, 04/20/2020   Pneumococcal Polysaccharide-23 07/03/2005, 07/04/2007, 04/18/2011   Td  06/02/2009   Zoster Recombinat (Shingrix) 04/09/2020   Zoster, Live 07/03/2006   Patient usually eats granola bars and cereal for breakfast. Patient feels like breakfast is difficult because she needs to eat protein. She likes eggs and doesn't eat them often.   Patient is on 28 units of Tresiba right now and will continue increasing her dose to aim for BGs < 140 in the morning.  CGM report:      Conditions to be addressed/monitored:  Hypertension, Hyperlipidemia, Diabetes, Atrial Fibrillation, Heart Failure, Coronary Artery Disease, Anxiety and Allergic Rhinitis  Conditions addressed this visit: Diabetes, GERD  Care Plan : CCM Pharmacy Care Plan  Updates made by Viona Gilmore, [place] since 03/08/2022 12:00 AM     Problem: Problem: Hypertension, Hyperlipidemia, Diabetes, Atrial Fibrillation, Heart Failure, Coronary Artery Disease, Anxiety and Allergic Rhinitis       Long-Range Goal: Patient-Specific Goal   Start Date: 11/26/2020  Expected End Date: 11/26/2021  Recent Progress: On track  Priority: High  Note:   Current Barriers:  Unable to maintain control of diabetes  Pharmacist Clinical Goal(s):  Patient will achieve adherence to monitoring guidelines and medication adherence to achieve therapeutic efficacy achieve control of diabetes as evidenced by home blood sugar readings and A1c  through collaboration with PharmD and provider.   Interventions: 1:1 collaboration with [name], [name], MD regarding development and update of comprehensive plan of care as evidenced by provider attestation and co-signature Inter-disciplinary care team collaboration (see longitudinal plan of care) Comprehensive medication review performed; medication list updated in electronic medical record  Hypertension (BP goal <130/80) -Not ideally controlled -Current treatment: Diltiazem (Cardizem CD) 325m, 1 capsule once daily  - Appropriate, Query effective, Safe, Accessible Metoprolol tartrate 50 mg 1 tablet in the AM and 2 tablets in the PM - Appropriate, Query effective, Safe, Accessible Spironolactone 1/2 tablet once daily - Appropriate, Query effective, Safe, Accessible -Medications previously tried: indapamide, losartan -Current home readings: 124/64 (couple times a week) -Current dietary habits: did not discuss -Current exercise habits: limited due to shortness of breath -Denies hypotensive/hypertensive symptoms -Educated on Importance of home blood pressure monitoring; Proper BP monitoring technique; -Counseled to monitor BP at home weekly, document, and provide log at future appointments -Counseled on diet and exercise extensively Recommended to continue current medication  Hyperlipidemia: (LDL goal < 55) -Uncontrolled -Current treatment: Atorvastatin 444m 1 tablet once daily - Appropriate, Query effective, Safe, Accessible -Medications previously tried:  none  -Current dietary patterns: did not discuss -Current exercise habits: limited due to shortness of breath -Educated on Cholesterol goals;  Importance of limiting foods high in cholesterol; -Counseled on diet and exercise extensively Recommended to continue current medication  CAD (Goal: prevent heart attacks and strokes) -Controlled -Current treatment  Atorvastatin 4052m1 tablet daily  - Appropriate, Effective, Safe, Accessible Nitroglycerin 0.4mg46m tablet, place 1 tablet under tongue every five minutes for 3 doses as needed for chest pain - Appropriate, Effective, Safe, Accessible -Medications previously tried: none  -Recommended to continue current medication  Diabetes (A1c goal  <7.5% per PCP ) -Not ideally controlled -Current medications: Metformin 1000 mg XR 1 tablet twice daily - Appropriate, Query effective, Safe, Accessible Jardiance 25 mg 1 tablet daily - Appropriate, Query effective, Safe, Accessible Tresiba inject 28 units daily - Appropriate, Query effective, Safe, Accessible -Medications previously tried: FarxIranst)  -Current home glucose readings fasting glucose: 150-160s range post prandial glucose: n/a -Denies hypoglycemic/hyperglycemic symptoms -Current meal patterns:  breakfast: premier protein shake with fruit lunch:  salad dinner: steak, salad and bread snacks: did not discuss drinks: did not discuss -Current exercise: limited with shortness of breath -Educated on A1c and blood sugar goals; Benefits of routine self-monitoring of blood sugar; Carbohydrate counting and/or plate method -Counseled to check feet daily and get yearly eye exams -Counseled on diet and exercise extensively Recommended to continue current medication Collaborated with PCP to continue to increase Tresiba by 2 units every 2-3 days. .  Atrial Fibrillation (Goal: prevent stroke and major bleeding) -Controlled -CHADSVASC: 6 -Current treatment: Rate control: Diltiazem  (Cardizem CD) 343m, 1 capsule once daily, dofetilide 250 mcg 1 capsule twice daily - Appropriate, Effective, Safe, Accessible Anticoagulation: Eliquis 523m 1 tablet twice daily  - Appropriate, Effective, Safe, Accessible -Medications previously tried: dronedarone (patient reported retaining fluid), amiodarone, dofetilide, sotalol  -Home BP and HR readings: did not discuss  -Counseled on importance of adherence to anticoagulant exactly as prescribed; avoidance of NSAIDs due to increased bleeding risk with anticoagulants; -Recommended to continue current medication  Heart Failure (Goal: manage symptoms and prevent exacerbations) -Controlled -Last ejection fraction: 60-65% (Date: 05/20/2020) -HF type: Diastolic -NYHA Class: III (marked limitation of activity) -AHA HF Stage: C (Heart disease and symptoms present) -Current treatment: Torsemide 2032m3 tablets daily - Appropriate, Effective, Safe, Accessible Klor-con 88m21m1 tablet daily - Appropriate, Effective, Safe, Accessible Jardiance 25 mg 1 tablet daily - Appropriate, Effective, Safe, Accessible Metoprolol tartrate 50 mg 1 tablet in the AM and 2 tablets in the PM - Appropriate, Effective, Safe, Accessible Metolazone 2.5 mg 1 tablet every 4 days as needed - Appropriate, Effective, Safe, Accessible -Medications previously tried: n/a  -Current home BP/HR readings: did not discuss -Current dietary habits: limiting salt intake -Current exercise habits: limited due to shortness of breath -Educated on Importance of weighing daily; if you gain more than 3 pounds in one day or 5 pounds in one week, call cardiologist -Recommended to continue current medication  Stress/Anxiety (Goal: minimize symptoms) -Controlled -Current treatment: Duloxetine 60mg29mapsule once daily - Appropriate, Effective, Safe, Accessible -Medications previously tried/failed: fluoxetine -PHQ9: 0 -GAD7: n/a -Educated on Benefits of medication for symptom  control -Recommended to continue current medication  Allergic rhinitis (Goal: minimize symptoms) -Controlled -Current treatment  Fluticasone (Flonase) 50mcg10mt nasal spray, 1 spray into both nostrils daily  - Appropriate, Effective, Safe, Accessible Loratadine 10mg,173mlet once daily - Appropriate, Effective, Safe, Accessible -Medications previously tried: none  -Recommended to continue current medication  GERD (Goal: minimize symptoms) -Controlled -Current treatment  Omeprazole 88mg, 146msule twice daily - Appropriate, Effective, Safe, Accessible -Medications previously tried: none  -Recommended to continue current medication Patient is taking for stomach pain and reports it is helping now with twice daily  Bone health (Goal prevent fractures) -Controlled -Last DEXA Scan: 04/08/2014  T-Score femoral neck: 0.85  T-Score total hip: n/a  T-Score lumbar spine: n/a  T-Score forearm radius: n/a  10-year probability of major osteoporotic fracture: 5.9%  10-year probability of hip fracture: 0.2% -Patient is not a candidate for pharmacologic treatment -Current treatment  Calcium carbonate/ vitamin D, 1 tablet at bedtime - Appropriate, Effective, Safe, Accessible Cholecalciferol (125mcg)  22m units, 1 tablet every evening - Appropriate, Effective, Safe, Accessible -Medications previously tried: none  -Recommend [phone] units of vitamin D daily. Recommend 1200 mg of calcium daily from dietary and supplemental sources. Recommend weight-bearing and muscle strengthening exercises for building and maintaining bone density. -Recommended to continue current medication  Health Maintenance -Vaccine gaps: Shingrix -Current therapy:  APAP 500mg, 244m  in the AM, and 2 in the PM  Lysine 1071m, 1 tablet at bedtime  Multivitamin, 1 tablet once daily  Systane, 1 drop into both eyes twice daily as needed Valacyclovir 10015m 2 tablet every twelve hours as needed for fever blisters/ cold sores   -Educated on Cost vs benefit of each product must be carefully weighed by individual consumer -Patient is satisfied with current therapy and denies issues -Recommended to continue current medication  Patient Goals/Self-Care Activities Patient will:  - take medications as prescribed check glucose daily, document, and provide at future appointments engage in dietary modifications by following healthy plate model  Follow Up Plan: The care management team will reach out to the patient again over the next 30 days.           Medication Assistance: None required.  Patient affirms current coverage meets needs.  Compliance/Adherence/Medication fill history: Care Gaps: Shingrix - 2nd dose, colonoscopy, eye exam, urine microalbumin, tetanus, foot exam BP-154/66 ( 01/16/22) A1c - 7.7% (12/26/21)  Star-Rating Drugs: Metformin (Glucophage) 500 mg - Last filled 01/12/22 90 DS at [facility] Empagliflozin (Jardiance) 25 mg - Last filled 11/17/21 100 DS at [facility] Atorvastatin (Lipitor) 40 mg - Last filled 12/07/21 100 DS at [facility]  Patient's preferred pharmacy is:  [id number], [place] [address] [place]: [phone]: [phone] [place] Registered Caremark Sites [address] [place]: [phone]: [id number] GaryC [place] [place]WH[address] [place] Ranch[place]ady [place]one: [phone]: [phone] ([facility]) - [place] [id number]: [phone]: [phone] [facility] [address] [place]one: [phone]: [phone] - Gre[place]qu[place]C [place]  [id number] [place]: [phone]: [phone] pill box? Yes - patient uses weekly pill box for 2 weeks Pt endorses 100% compliance  We discussed: Benefits of medication synchronization, packaging and delivery as well as enhanced pharmacist oversight with [facility]. Patient decided to: Continue current medication management strategy  Care Plan and Follow Up Patient Decision:  Patient agrees to Care Plan and Follow-up.  Plan: The care management team will reach out to the patient again over the next 30 days.  MadJeni SallesharmD BCABaylor [facility] Pharmacist [place]

## 2022-03-08 NOTE — Telephone Encounter (Signed)
[facility] has the order confirmation received

## 2022-03-09 DIAGNOSIS — M9903 Segmental and somatic dysfunction of lumbar region: Secondary | ICD-10-CM | POA: Diagnosis not present

## 2022-03-09 DIAGNOSIS — M5136 Other intervertebral disc degeneration, lumbar region: Secondary | ICD-10-CM | POA: Diagnosis not present

## 2022-03-09 DIAGNOSIS — M50322 Other cervical disc degeneration at C5-C6 level: Secondary | ICD-10-CM | POA: Diagnosis not present

## 2022-03-09 DIAGNOSIS — M9901 Segmental and somatic dysfunction of cervical region: Secondary | ICD-10-CM | POA: Diagnosis not present

## 2022-03-13 ENCOUNTER — Inpatient Hospital Stay: Admission: RE | Admit: 2022-03-13 | Payer: Medicare Other | Source: Ambulatory Visit

## 2022-03-13 ENCOUNTER — Ambulatory Visit
Admission: RE | Admit: 2022-03-13 | Discharge: 2022-03-13 | Disposition: A | Discharge status: Home / Self Care | Payer: Medicare Other | Source: Ambulatory Visit | Attending: Primary Care | Admitting: Primary Care

## 2022-03-13 DIAGNOSIS — R590 Localized enlarged lymph nodes: Secondary | ICD-10-CM | POA: Diagnosis not present

## 2022-03-13 DIAGNOSIS — J84112 Idiopathic pulmonary fibrosis: Secondary | ICD-10-CM | POA: Diagnosis not present

## 2022-03-13 DIAGNOSIS — R918 Other nonspecific abnormal finding of lung field: Secondary | ICD-10-CM | POA: Diagnosis not present

## 2022-03-13 DIAGNOSIS — I700 Atherosclerosis of aorta: Secondary | ICD-10-CM | POA: Diagnosis not present

## 2022-03-13 DIAGNOSIS — J849 Interstitial pulmonary disease, unspecified: Secondary | ICD-10-CM

## 2022-03-15 DIAGNOSIS — M9901 Segmental and somatic dysfunction of cervical region: Secondary | ICD-10-CM | POA: Diagnosis not present

## 2022-03-15 DIAGNOSIS — M9903 Segmental and somatic dysfunction of lumbar region: Secondary | ICD-10-CM | POA: Diagnosis not present

## 2022-03-15 DIAGNOSIS — M5136 Other intervertebral disc degeneration, lumbar region: Secondary | ICD-10-CM | POA: Diagnosis not present

## 2022-03-15 DIAGNOSIS — M50322 Other cervical disc degeneration at C5-C6 level: Secondary | ICD-10-CM | POA: Diagnosis not present

## 2022-03-16 DIAGNOSIS — M50322 Other cervical disc degeneration at C5-C6 level: Secondary | ICD-10-CM | POA: Diagnosis not present

## 2022-03-16 DIAGNOSIS — M9901 Segmental and somatic dysfunction of cervical region: Secondary | ICD-10-CM | POA: Diagnosis not present

## 2022-03-16 DIAGNOSIS — M9903 Segmental and somatic dysfunction of lumbar region: Secondary | ICD-10-CM | POA: Diagnosis not present

## 2022-03-16 DIAGNOSIS — M5136 Other intervertebral disc degeneration, lumbar region: Secondary | ICD-10-CM | POA: Diagnosis not present

## 2022-03-21 ENCOUNTER — Ambulatory Visit: Payer: Medicare Other | Admitting: Internal Medicine

## 2022-03-21 ENCOUNTER — Other Ambulatory Visit: Payer: Self-pay

## 2022-03-21 DIAGNOSIS — M9901 Segmental and somatic dysfunction of cervical region: Secondary | ICD-10-CM | POA: Diagnosis not present

## 2022-03-21 DIAGNOSIS — M50322 Other cervical disc degeneration at C5-C6 level: Secondary | ICD-10-CM | POA: Diagnosis not present

## 2022-03-21 DIAGNOSIS — M9903 Segmental and somatic dysfunction of lumbar region: Secondary | ICD-10-CM | POA: Diagnosis not present

## 2022-03-21 DIAGNOSIS — M5136 Other intervertebral disc degeneration, lumbar region: Secondary | ICD-10-CM | POA: Diagnosis not present

## 2022-03-21 MED ORDER — SPIRONOLACTONE 25 MG PO TABS: 12.5000 mg | ORAL_TABLET | Freq: Every day | ORAL | 3 refills | Status: DC

## 2022-03-22 DIAGNOSIS — G4733 Obstructive sleep apnea (adult) (pediatric): Secondary | ICD-10-CM | POA: Diagnosis not present

## 2022-03-22 DIAGNOSIS — G4710 Hypersomnia, unspecified: Secondary | ICD-10-CM | POA: Diagnosis not present

## 2022-03-22 DIAGNOSIS — I272 Pulmonary hypertension, unspecified: Secondary | ICD-10-CM | POA: Diagnosis not present

## 2022-03-22 DIAGNOSIS — I10 Essential (primary) hypertension: Secondary | ICD-10-CM | POA: Diagnosis not present

## 2022-03-23 ENCOUNTER — Telehealth: Payer: Self-pay | Admitting: Pharmacist

## 2022-03-23 NOTE — Telephone Encounter (Signed)
Patient called me as she had her first incident of a low yesterday and was worried. Discussed how to treat a low blood sugar. Patient is also aware to decrease her dose if she experiences another low. She also was unable to inject her insulin today. Instructed her to make sure the needle was screwed on tightly and patient was missing that step. She was able to inject over the phone. Recommended to reach back out for further questions.

## 2022-03-27 ENCOUNTER — Telehealth: Payer: Self-pay | Admitting: Internal Medicine

## 2022-03-27 NOTE — Telephone Encounter (Signed)
Pt is calling and would like to speak with [facility]

## 2022-03-27 NOTE — Telephone Encounter (Signed)
Patient reported her sensor is giving her low readings but she checked with her glucometer and it was a 154. She said it started yesterday and keeps saying she is having a low since then. Recommended calling [employer] for a replacement sensor and going ahead and taking off her current one and replacing it. Patient verbalized her understanding.

## 2022-03-27 NOTE — Progress Notes (Signed)
See above Cirrhosis of the liver, she has a hx fatty liver. Make sure is just following with a GI doctor for this.

## 2022-03-27 NOTE — Progress Notes (Signed)
CT chest showed improvement in upper lung GGO/consolidation consistent with inflammation vs nonspecific infection. Stable areas of fibrosis. She has an apt with Dr. Chase Caller on 10/3, we can review at that time further.

## 2022-03-28 DIAGNOSIS — M5136 Other intervertebral disc degeneration, lumbar region: Secondary | ICD-10-CM | POA: Diagnosis not present

## 2022-03-28 DIAGNOSIS — M50322 Other cervical disc degeneration at C5-C6 level: Secondary | ICD-10-CM | POA: Diagnosis not present

## 2022-03-28 DIAGNOSIS — M9903 Segmental and somatic dysfunction of lumbar region: Secondary | ICD-10-CM | POA: Diagnosis not present

## 2022-03-28 DIAGNOSIS — M9901 Segmental and somatic dysfunction of cervical region: Secondary | ICD-10-CM | POA: Diagnosis not present

## 2022-04-01 DIAGNOSIS — E1159 Type 2 diabetes mellitus with other circulatory complications: Secondary | ICD-10-CM

## 2022-04-01 DIAGNOSIS — I2510 Atherosclerotic heart disease of native coronary artery without angina pectoris: Secondary | ICD-10-CM | POA: Diagnosis not present

## 2022-04-01 DIAGNOSIS — I5030 Unspecified diastolic (congestive) heart failure: Secondary | ICD-10-CM | POA: Diagnosis not present

## 2022-04-01 DIAGNOSIS — I110 Hypertensive heart disease with heart failure: Secondary | ICD-10-CM | POA: Diagnosis not present

## 2022-04-01 DIAGNOSIS — I4891 Unspecified atrial fibrillation: Secondary | ICD-10-CM | POA: Diagnosis not present

## 2022-04-01 DIAGNOSIS — Z794 Long term (current) use of insulin: Secondary | ICD-10-CM

## 2022-04-04 ENCOUNTER — Telehealth: Payer: Self-pay

## 2022-04-04 ENCOUNTER — Ambulatory Visit: Payer: Medicare Other | Admitting: Internal Medicine

## 2022-04-04 ENCOUNTER — Telehealth: Payer: Self-pay | Admitting: Internal Medicine

## 2022-04-04 ENCOUNTER — Encounter: Payer: Self-pay | Admitting: Internal Medicine

## 2022-04-04 VITALS — BP 118/72 | HR 63 | Temp 99.1°F | Ht 65.5 in | Wt 232.0 lb

## 2022-04-04 DIAGNOSIS — Z23 Encounter for immunization: Secondary | ICD-10-CM | POA: Diagnosis not present

## 2022-04-04 DIAGNOSIS — J9611 Chronic respiratory failure with hypoxia: Secondary | ICD-10-CM

## 2022-04-04 DIAGNOSIS — Z8709 Personal history of other diseases of the respiratory system: Secondary | ICD-10-CM

## 2022-04-04 DIAGNOSIS — I5032 Chronic diastolic (congestive) heart failure: Secondary | ICD-10-CM | POA: Diagnosis not present

## 2022-04-04 DIAGNOSIS — I2729 Other secondary pulmonary hypertension: Secondary | ICD-10-CM

## 2022-04-04 DIAGNOSIS — Z8619 Personal history of other infectious and parasitic diseases: Secondary | ICD-10-CM

## 2022-04-04 DIAGNOSIS — R0609 Other forms of dyspnea: Secondary | ICD-10-CM

## 2022-04-04 NOTE — Patient Instructions (Addendum)
ICD-10-CM   1. Chronic respiratory failure with hypoxia (HCC)  J96.11     2. Hypersensitivity pneumonia (HCC)  J67.9     3. ILD (interstitial lung disease) (HCC)  J84.9     4. Chronic diastolic CHF (congestive heart failure) (HCC)  I50.32     5. WHO group 3 pulmonary arterial hypertension (HCC)  I27.23          Right heart cath OCt 2023 showed resolved pulmonary congestion compared to July 2023 Right heart cath Currently you do have pulmonary hypertension from sleep apnea You might still have ILD from  chronic hypersensitivity pneumonitis  to explain your o2 need  Plan - do simple walking desaturation test on room air 07/10/2022  - continue 2-4L [place] at home  - - do full PFT first available in 3 months - do HRCT supine and prone in 3 months with insipratory and expiratory volume   = help establish for sure if there is ILD or not  - talk to PCP [name], Wanda K, MD about ozempic or wegovy - weight loss needed  - discussed tyvaso for pulmonary hypertension  - shared decision making and will hold off  Followup  -3onths but after PFT and HRCT - 30 min visit  - simple walk test at followup 

## 2022-04-04 NOTE — Telephone Encounter (Signed)
HI Dr Johney Frame  Re [name] -wondering if she needs a right heart catheterization repeated.  She is actually holding oxygen sat is better.  When I walked her on room air she was able to walk over 200 feet without desaturations.  Even in the past has been some confusion whether she has ILD versus heart failure on her CT scan.  In 2018 the radiologist thought features which was similar to current were reflective of heart failure.   If her medical diagnosis of ILD it would be chronic hypersensitivity pneumonitis for which prednisone CellCept or nintedanib other medications all of which have side effects   Thanks    SIGNATURE    Dr. Brand Males, M.D., F.C.C.P,  Pulmonary and Critical Care Medicine Staff Physician, [place] Director - Interstitial Lung Disease  Program  Medical Director - [facility] [place] [facility] [place], [place], [address]   Pager: [phone], If no answer  -[place] or Try [phone] Telephone (clinical office): [phone] Telephone (research): [phone]  1:21 PM 04/04/2022

## 2022-04-04 NOTE — Progress Notes (Signed)
IOV 12/05/2016  Chief Complaint  Patient presents with   PULMONARY CONSULT    PULMONARY CONSULT FOR SOB referring doctor is Dr. [name]. DME is [facility] patient sleeps 8 hours a night with her CPAP machine     76 year old obese femal. She was admitted 11/12/2016 through 11/15/2016 for acute on chronic diastolic congestive heart failure. She is known to have sleep apnea essential hypertension coronary artery disease status post PTCA, paroxysmal A. fib, type 2 diabetes. She tells me that around 6 years ago she was diagnosed to have sleep apnea and started on CPAP therapy. Around this time she developed insidious onset of shortness of breath. Since then it has been progressively has been present on exertion relieved by rest. Class II-III activities bring it on. There is associated cough for the last 3 years. Both symptoms are progressive and is currently moderate in severity. Cough quality is dry. There is no associated chest pain or any radiation. There is no associated wheezing or orthopnea. Dyspnea can get worse during episodes of heart failure or atrial fibrillation. She's been on amiodarone therapy for the last 3 months after failing Tikosyn. She status post ablation. After the admission in May 2018 for heart failure that is concerned that she might have interstitial lung disease that she's been referred here. She feels currently well diuresed   Last chest x-ray 11/14/2016: Personally visualized and it looks like she has interstitial markings as opposed to heart failure   Echocardiogram April 2018: Pulmonary artery systolic pressure of 53 mmHg associated with grade 2 diastolic dysfunction  OV 0/93/8182  Chief Complaint  Patient presents with   Follow-up    Pt here after HRCT. Pt states [place] the mountains [place] and states she wasnt SOB at all but when she returned home she was more SOB. Pt denies cough and CP/tightness.     Follow-up chronic diastolic heart failure and  amiodarone therapy with? Of interstitial lung disease   Last visit June 2018 there was question of interstitial lung disease therefore we did a high-resolution CT chest with thoracic radiology believes that the findings are consistent with pulmonary edema. Therefore I held off on interstitial lung disease workup. In the interim she has lost 7 pounds. She went to the mountains and was active and return on the last week and she says that her dyspnea is actually better. She continues to be on 80 mg Lasix in the morning with 40 mg Lasix at night on her stable dose for several months. Because of the weight loss is actually feeling better although after return to [place] on lower altitude she says some of the shortness of breath is better but she still better than before. Walking desaturation test 185 feet 3 laps on room air: She walk only one lap and stopped due to knee pain she is upcoming knee replacement. A pulse ox did go down to 93% but it was still adequate.    IMPRESSION: 1. Cardiomegaly. Diffuse interlobular septal thickening, parahilar ground-glass attenuation and upper lung predominant mild patchy consolidation. These findings are most compatible with pulmonary edema due to congestive heart failure. 2. No specific findings of interstitial lung disease. No hyperdense foci of consolidation in the lungs specific for amiodarone pulmonary toxicity. 3. Mild to moderate mediastinal and mild bilateral hilar lymphadenopathy, stable since 09/25/2016, suggesting reactive/congestive adenopathy. 4. Three-vessel coronary atherosclerosis.   Aortic Atherosclerosis (ICD10-I70.0).     Electronically Signed   By: Rinaldo Ratel  Poff M.D.   On: 01/18/2017 16:41     OV 04/05/2017  Chief Complaint  Patient presents with   Follow-up    Pt states that she went to the mountains this past weekend 9/29-9/30 and breathing was a little labored then. Pt is working on losing weight which is helping with her  breathing. Still becomes SOB on exertion and c/o sinus drainage. Denies any cough or CP.    259# -> 252# -> 245# with [place]   OCt 2020 tele visit    Today's tele-visit is a follow-up for sleep apnea.  She was last seen in 2018. Patient says she has been doing well on CPAP.  Says she never misses a night.  She wears it all night for at least 8 to 9 hours.  She feels rested with no significant daytime sleepiness.  States her mask is working well.  She does need order for new CPAP supplies. She is trying to lose weight but has not been as active with the COVID 19 virus  CPAP download shows excellent compliance with 100% usage.  She uses daily 9 hours.  She is on CPAP 13 cm H2O.  AHI 0.6.  Inpatient consult Nov 2021   Background Pulm History  - Obese lady.  With coronary artery disease that is post PTCA, A. fib, type 2 diabetes and hypertension. .  Used to be followed by Dr Rachell Cipro - [id number] for OSA (AHI 21/h in 2010)  and CPAP for which back then good compliance was reported.  Then in May 2018 2018 she she had an admission for diastolic heart failure.  In the aftermath of that she continues to have dyspnea and exertional desaturation in the setting of amiodarone therapy and there was concern that she had interstitial lung disease and was seen in the pulmonary clinic.  However radiology felt the CT scan was most consistent with heart failure.  With subsequent diuresis at follow-up in October 2018 she was improved though there was still crackles and there is clinical concern for ILD.  She was referred for heart catheterization and underwent this in November 2018.  Her V waves are prominent.  Her mean pulmonary pressure was elevated at 48 mm per mercury and a high cardiac output of 8.5 L/min with only minimally elevated pulmonary vascular resistance of 3 Wood units.  Dr. Burt Knack recommended pulmonary vein isolation to rule out pulmonary vein stenosis.  In addition patient also had single-vessel  coronary artery disease but with continued patency of the stented segment in the right coronary artery.   After that she was lost to pulmonary follow-up.  Review of the chart indicate atrial fibrillation has been a problem and an ablation has been planned by Dr. Curt Bears in January 2022.  She appears to no longer be on amiodarone.?  Stop date   She really presents now on May 19, 2020.  She tells at baseline she was not using oxygen at home.  She was exposed to her husband was sick with nonproductive cough.  Then she developed pain in her left ear.  She took outpatient antibiotics but did not respond she got progressively short of breath and got admitted to the hospital.  He had Covid and flu test have been negative.  She has had a Covid vaccine.  She required facemask and nasal cannula oxygen.  Chest x-ray just shows hyperinflation.  She had a CT chest without contrast [not a high-resolution CT chest] shows groundglass opacities in the upper lobes but otherwise  no presence of chronic ILD.   She is on chronic eliquids   Pulmonary has been consulted    Dc summar 06/02/20  Acute hypoxemic respiratory failure/acute on chronic diastolic heart failure /Pulmonary hypertension Echocardiogram with preserved LV systolic function EF 55 to 60%, elevated PA pressure 63 mmHg systolic.  Patient was seen by cardiology.  Patient was placed on diuretics with good diuresis.  Her oxygen requirements have improved.  She is now saturating normal on room air.  She is ambulated without any difficulty.     Atrial fibrillation with rapid ventricular response The patient has converted to 2:1 flutter, rate in the 70's.  Seen by cardiology.  Will be discharged on beta-blocker diltiazem and dofetilide.  Continue apixaban.  She has an appointment with cardiology in January to consider ablation.      RSV bronchitis/ viral pneumonia (present on admission) Supportive care was provided.  Due to wheezing she was started on  steroids with improvement.  She will be discharged on tapering doses.     Hyperglycemia due to steroids HbA1c noted to be 8.1.  Patient not on any glucose lowering agents at home.  This will need further evaluation in the outpatient setting.   Delirium/Sundowning Patient with hallucinations.  Likely due to medications and hospital stay.  Resolved.    Hyperlipidemia Continue with atorvastatin.    CKD II Creatinine noted to be 1.57 today.  She has good urine output.  Will recommend renal function be checked in the outpatient setting at follow-up..    Depression Cntinue with duloxetine  OV 09/21/2020  Subjective:  Patient ID: [name], female , DOB: 04-08-1946 , age 64 y.o. , MRN: [id number] , ADDRESS: [place] [id number] PCP [name], [name], MD Patient Care Team: [name], MD as PCP - General Curt Bears, Ocie Doyne, MD as PCP - Electrophysiology (Cardiology) Nahser, Wonda Cheng, MD as PCP - Cardiology (Cardiology) Gaynelle Arabian, MD as Consulting Physician (Orthopedic Surgery) Brand Males, MD as Consulting Physician (Pulmonary Disease) Earnie Larsson, [facility] as Pharmacist (Pharmacist)  This Provider for this visit: Treatment Team:  Attending Provider: Brand Males, MD    09/21/2020 -   Chief Complaint  Patient presents with   [facility] follow up-Shortness of breath with activity     [name] 76 y.o. -post hospital follow-up for acute respiratory failure because of diastolic dysfunction and RSV bronchopneumonia in #2021.  She was extremely hypoxemic requiring high flow nasal cannula.  She required lot of diuresis and time.  She was discharged without oxygen.  Currently she says she is compliant with the CPAP without oxygen.  She is not needing any oxygen at rest or with exertion.  She has class II dyspnea on exertion relieved by rest no chest pain.  She feels his dyspnea is baseline prehospitalization and is related to  obesity.  Overall she is feeling good.  No orthopnea.  No proximal nocturnal dyspnea.  No cough.  No wheezing.   [facility] Patient presents today for hospital follow-up.  Patient was admitted from 12/10/2021 - 12/16/2021 for acute hypoxic respiratory failure secondary to acute on chronic diastolic heart failure, pulmonary fibrosis and presumed OSA/OHS.  Patient had questionable severe PAH noted on right heart cath in 2018.  No mention of pulmonary hypertension on patient's 2D echo at this time.  She was diuresed 7 L, still requiring 2 L supplemental oxygen.  CT chest from April 2022 concerning for possible pulmonary fibrosis  confirmed on high-resolution CT.  Patient had RSV in 2020 and has seen Dr. Chase Caller outpatient.  She will continue on torsemide, supplemental oxygen and CPAP at night.             Respiratory failure multifactorial due to CHF, pulmonary fibrosis and OSA.  She has abnormal groundglass opacities on HRCT from 6/13.  This is not significantly changed from prior scans over the past couple years.  Felt to clearly resemble pulmonary fibrosis.  No role for steroids or antibiotics at this time.  Recommend repeating serology.  She is feeling better since discharged She has noticed increased swelling in her feet/ankles and hands She is following with HF clinic, seen on Monday  She is on Torsemide '40mg'$  daily  Planning for heart cath in July with cardiology   TESTING: Echo 6/10 > LVEF 60-65%, Normal valves. RV, LV appear normal.  HRCT 6/13 independently reviewed shows scattered groundglass opacities bilateral consistent with multifocal inflammation and unchanged prior appearance of septal thickening and mosaic attenuation with lobular air trapping on expiration.  Interestingly nodular cirrhotic morphology of the liver was noted 06/2020 CT cors/pulmonary vein >> atypical left pulmonary vein drainage into left atrium   [facility] July 2023  Right Heart Pressures [facility] Procedural  Findings: Hemodynamics (mmHg) RA mean 14 RV 68/18 PA 63/24, mean 45 PCWP mean 25 with v-waves to 35 (prominent)  Oxygen saturations: PA 71% AO 92%  Cardiac Output (Fick) 7.7  Cardiac Index (Fick) 3.5 PVR 2.6 WU  PAPi 2.8    OV 04/04/2022  Subjective:  Patient ID: [name], female , DOB: 1946/02/13 , age 69 y.o. , MRN: [id number] , ADDRESS: [address] [place] [address] PCP [name], [name], MD Patient Care Team: [name], MD as PCP - General (Internal Medicine) Constance Haw, MD as PCP - Electrophysiology (Cardiology) [name], MD as PCP - Cardiology (Cardiology) Gaynelle Arabian, MD as Consulting Physician (Orthopedic Surgery) Brand Males, MD as Consulting Physician (Pulmonary Disease) Viona Gilmore, [facility] as Pharmacist (Pharmacist)  This Provider for this visit: Treatment Team:  Attending Provider: Brand Males, MD    04/04/2022 -   Chief Complaint  Patient presents with   Follow-up    Breathing is much improved. She has minimal, non prod cough at night.      HPI [name] 76 y.o. -morbidly obese lady who I have followed intermittently since 2018.  She is here with her husband who I am meeting for the first time.  Husband is a Clinical biochemist and does work for Dr. [employer].  Followed intermittently since 2018.  Back in 2018 there was question of interstitial lung disease but radiology felt findings on CT scan were more consistent with heart failure.  She is known to have obesity, diastolic dysfunction, pulmonary venous hypertension with prominent V waves and sleep apnea on CPAP.  However in 0932 course was complicated by RSV pneumonia and respiratory failure on admission.  In this admission 2 she required a lot of diuresis.  Then I last saw her in March 2022 after that not seen her for follow-up.  She then got hospitalized in June 2023 with similar admission.  Review of the notes indicate significant acute on  chronic diastolic heart failure requiring significant amount of diuresis but also concern for associated interstitial lung disease.  After diuresis she is significantly improved but still was discharged on approximately 3 L of oxygen.  She says that right now when she walks  10 feet on room air she will get dyspneic and wear the oxygen although she is not sure if she desaturates.  Walking oxygen desaturation test on room air she got dyspneic walking just 2 out of 3 laps but did not desaturate below 92%.  Overall she does feel better.    Since her admission she has followed up with nurse practitioner who did a serology profile which was negative but hypersensitivity pneumonitis profile is positive for Aspergillus Antibody.  With the CT findings of groundglass opacities concern is for hypersensitive pneumonitis.  However I repeatedly asked her whether she had any birds or down jacket or down pillow or down sulfa or any mildew exposure during the course of her life or any mold exposure.  She denied all this.  She is worked as a Scientist, physiological in the   but has been in different buildings but does not recall any mold or musty smell.  This also includes a home.  SYMPTOM SCALE - ILD 04/04/2022  Current weight   O2 use 3L at home  Shortness of Breath 0 -> 5 scale with 5 being worst (score 6 If unable to do)  At rest 5  Simple tasks - showers, clothes change, eating, shaving 3  Household (dishes, doing bed, laundry) 2  Shopping 2  Walking level at own pace 4  Walking up Stairs 2  Total (30-36) Dyspnea Score 18  How bad is your cough? 4  How bad is your fatigue 4  How bad is nausea 0  How bad is vomiting?  0  How bad is diarrhea? 2  How bad is anxiety? 3  How bad is depression 2  Any chronic pain - if so where and how bad x       Simple office walk 185 feet x  3 laps goal with forehead probe 09/21/2020  04/04/2022   O2 used ra Ra walk (uses 3L o2 at hom 24/7) - IMPROVED  Number laps completed avg  Only 2 of 3 and stopped due to fatigue, back pain and dyspnea  Comments about pace 3 lap 2  Resting Pulse Ox/HR 94% and 66/min 92% ANd HR 61  Final Pulse Ox/HR 91% and 90/min 92% and HR 73  Desaturated </= 88% no no  Desaturated <= 3% points yes no  Got Tachycardic >/= 90/min yes no  Symptoms at end of test soe dyspnea Dyspnea, back pain  Miscellaneous comments x DID NOT deags     PFT     Latest Ref Rng & Units 11/03/2016   11:34 AM  PFT Results  FVC-Pre L 1.38   FVC-Predicted Pre % 41   FVC-Post L 1.55   FVC-Predicted Post % 47   Pre FEV1/FVC % % 81   Post FEV1/FCV % % 85   FEV1-Pre L 1.12   FEV1-Predicted Pre % 44   FEV1-Post L 1.32   DLCO uncorrected ml/min/mmHg 14.71   DLCO UNC% % 53   DLCO corrected ml/min/mmHg 14.06   DLCO COR %Predicted % 50   DLVA Predicted % 88   TLC L 3.76   TLC % Predicted % 69   RV % Predicted % 103      Latest Reference Range & Units 12/29/21 10:37  ds DNA Ab IU/mL 3  RA Latex Turbid. <14 IU/mL <14  Anti-Jo-1 Ab (RDL) Units <20 Units CANCELED <20  Anti-PL-7 Ab (RDL) Negative  Negative  Anti-PL-12 Ab (RDL) Negative  Negative  Anti-EJ Ab (RDL) Negative  Negative  Anti-OJ Ab (RDL) Negative  Negative  Anti-SRP Ab (RDL) Negative  Negative  Anti-Mi-2 Ab (RDL) Negative  Negative  Anti-TIF-1gamma Ab (RDL) <20 Units <20  Anti-MDA-5 Ab (CADM-140)(RDL) <20 Units <20  Anti-NXP-2 (P140) Ab (RDL) <20 Units <20  Anti-SAE1 Ab, IgG (RDL) <20 Units <20  Anti-PM/Scl-100 Ab (RDL) <20 Units <20  Anti-Ku Ab (RDL) Negative  Negative  Anti-SS-A 52kD Ab, IgG (RDL) <20 Units <20  Anti-U1 RNP Ab (RDL) <20 Units <20  Anti-U2 RNP Ab (RDL) Negative  Negative  Anti-U3 RNP (Fibrillarin)(RDL) Negative  Negative  SSA (Ro) (ENA) Antibody, IgG <1.0 NEG AI <1.0 NEG  Scleroderma (Scl-70) (ENA) Antibody, IgG <1.0 NEG AI <1.0 NEG  A.Fumigatus #1 Abs Negative  Positive !  Micropolyspora faeni, IgG Negative  Negative  Thermoactinomyces vulgaris, IgG Negative  Negative   A. Pullulans Abs Negative  Negative  Thermoact. Saccharii Negative  Negative  [place] Serum Abs Negative  Negative  !: Data is abnormal   has a past medical history of Anticoagulant long-term use, Anxiety, Arthritis, CAD (coronary artery disease) (6948,5462), CHF (congestive heart failure) ([place]), Chronic atrial fibrillation ([place]) (06/2007), Chronic kidney disease, CVA (cerebral vascular accident) ([place]) (7035,0093), Depression, Diplopia (06/19/2008), Dysrhythmia, Edema of lower extremity, Hyperlipidemia, Hypertension, Inferior myocardial infarction [facility]), Myocardial infarction ([place]) (8182,9937), Obesity, OSA on CPAP, Pacemaker, Pneumonia (2014), Pulmonary hypertension ([place]), Shortness of breath, Skin cancer, Sleep apnea, Spondylolisthesis, TIA (transient ischemic attack) (2008), and Unspecified hemorrhoids without mention of complication (1/69/6789).   reports that she quit smoking about 39 years ago. Her smoking use included cigarettes. She started smoking about 43 years ago. She has a 5.00 pack-year smoking history. She has never used smokeless tobacco.  Past Surgical History:  Procedure Laterality Date   ABDOMINAL HYSTERECTOMY     APPENDECTOMY  1984   ATRIAL FIBRILLATION ABLATION N/A 09/29/2016   Procedure: Atrial Fibrillation Ablation;  Surgeon: Will Meredith Leeds, MD;  Location: [place] CV LAB;  Service: Cardiovascular;  Laterality: N/A;   ATRIAL FIBRILLATION ABLATION N/A 02/07/2018   Procedure: ATRIAL FIBRILLATION ABLATION;  Surgeon: Constance Haw, MD;  Location: [place] [facility];  Service: Cardiovascular;  Laterality: N/A;   ATRIAL FIBRILLATION ABLATION N/A 03/13/2019   Procedure: ATRIAL FIBRILLATION ABLATION;  Surgeon: Constance Haw, MD;  Location: [place] [facility];  Service: Cardiovascular;  Laterality: N/A;   ATRIAL FIBRILLATION ABLATION N/A 07/07/2020   Procedure: ATRIAL FIBRILLATION ABLATION;  Surgeon: Constance Haw, MD;  Location: [place] [facility];  Service:  Cardiovascular;  Laterality: N/A;   BACK SURGERY     CARDIOVERSION N/A 09/12/2017   Procedure: CARDIOVERSION;  Surgeon: Jerline Pain, MD;  Location: [facility] ENDOSCOPY;  Service: Cardiovascular;  Laterality: N/A;   CARDIOVERSION N/A 12/13/2017   Procedure: CARDIOVERSION;  Surgeon: Sanda Klein, MD;  Location: [place] ENDOSCOPY;  Service: Cardiovascular;  Laterality: N/A;   CARDIOVERSION N/A 02/18/2018   Procedure: CARDIOVERSION;  Surgeon: Dorothy Spark, MD;  Location: [facility] ENDOSCOPY;  Service: Cardiovascular;  Laterality: N/A;   CARDIOVERSION N/A 05/20/2018   Procedure: CARDIOVERSION;  Surgeon: [name], MD;  Location: [facility] ENDOSCOPY;  Service: Cardiovascular;  Laterality: N/A;   CARDIOVERSION N/A 04/16/2019   Procedure: CARDIOVERSION;  Surgeon: Donato Heinz, MD;  Location: [place];  Service: Endoscopy;  Laterality: N/A;   CARDIOVERSION N/A 01/22/2020   Procedure: CARDIOVERSION;  Surgeon: Geralynn Rile, MD;  Location: [place];  Service: Cardiovascular;  Laterality: N/A;   CARDIOVERSION N/A 02/18/2020   Procedure: CARDIOVERSION;  Surgeon: Cherlynn Kaiser  A, MD;  Location: [place];  Service: Cardiovascular;  Laterality: N/A;   CATARACT EXTRACTION W/ INTRAOCULAR LENS  IMPLANT, BILATERAL Bilateral 01/15/2017- 03/2017   CHOLECYSTECTOMY     CORONARY ANGIOPLASTY  X 2   CORONARY ANGIOPLASTY WITH STENT PLACEMENT  1998; ~ 2007; ?date   "1 stent; replaced stent; not sure when I got the last stent" (04/23/2017)   DOPPLER ECHOCARDIOGRAPHY  2009   ELECTROPHYSIOLOGIC STUDY N/A 03/31/2016   Procedure: Cardioversion;  Surgeon: Evans Lance, MD;  Location: [place] [facility];  Service: Cardiovascular;  Laterality: N/A;   ELECTROPHYSIOLOGIC STUDY N/A 08/04/2016   Procedure: Cardioversion;  Surgeon: Evans Lance, MD;  Location: [place] CV LAB;  Service: Cardiovascular;  Laterality: N/A;   INSERT / REPLACE / REMOVE PACEMAKER  06/2007   IR RADIOLOGY PERIPHERAL GUIDED IV START   01/31/2018   IR US GUIDE VASC ACCESS LEFT  01/31/2018   JOINT REPLACEMENT     LAPAROSCOPIC CHOLECYSTECTOMY  1994   LEFT AND RIGHT HEART CATHETERIZATION WITH CORONARY ANGIOGRAM N/A 10/06/2013   Procedure: LEFT AND RIGHT HEART CATHETERIZATION WITH CORONARY ANGIOGRAM;  Surgeon: Troy Sine, MD;  Location: [facility] CATH LAB;  Service: Cardiovascular;  Laterality: N/A;   LEFT OOPHORECTOMY Left ~ [place]  2000s - 04/2015 X 3   L3-4; L4-5; L2-3; Dr Trenton Gammon   RIGHT HEART CATH N/A 01/04/2022   Procedure: RIGHT HEART CATH;  Surgeon: Larey Dresser, MD;  Location: [place] CV LAB;  Service: Cardiovascular;  Laterality: N/A;   RIGHT/LEFT HEART CATH AND CORONARY ANGIOGRAPHY N/A 05/09/2017   Procedure: RIGHT/LEFT HEART CATH AND CORONARY ANGIOGRAPHY;  Surgeon: Sherren Mocha, MD;  Location: [place] CV LAB;  Service: Cardiovascular;  Laterality: N/A;   SKIN CANCER EXCISION Right    [place]   TEE WITHOUT CARDIOVERSION N/A 09/29/2016   Procedure: TRANSESOPHAGEAL ECHOCARDIOGRAM (TEE);  Surgeon: Jerline Pain, MD;  Location: [facility] ENDOSCOPY;  Service: Cardiovascular;  Laterality: N/A;   TOTAL ABDOMINAL HYSTERECTOMY  1984   "uterus & right ovary"   TOTAL KNEE ARTHROPLASTY Left 04/23/2017   TOTAL KNEE ARTHROPLASTY Left 04/23/2017   Procedure: TOTAL KNEE ARTHROPLASTY;  Surgeon: Frederik Pear, MD;  Location: [place];  Service: Orthopedics;  Laterality: Left;   ULTRASOUND GUIDANCE FOR VASCULAR ACCESS  05/09/2017   Procedure: Ultrasound Guidance For Vascular Access;  Surgeon: Sherren Mocha, MD;  Location: [place] [facility] LAB;  Service: Cardiovascular;;    Allergies  Allergen Reactions   Hydrocodone-Guaifenesin Other (See Comments)    Confusion/ memory loss   Adhesive [Tape] Rash and Other (See Comments)    Allergic to defibrillation pads.   Pedi-Pre Tape Spray [Wound Dressing Adhesive] Other (See Comments) and Rash    Allergic to defibrillation pads.    Immunization History  Administered  Date(s) Administered   Fluad Quad(high Dose 65+) 04/10/2019, 04/20/2021   Influenza Split 04/18/2011, 05/06/2012   Influenza Whole 07/04/2007, 06/02/2009, 03/01/2010   Influenza, High Dose Seasonal PF 04/03/2014, 04/18/2016, 04/05/2017, 05/06/2018   Influenza,inj,Quad PF,6+ Mos 03/07/2013, 04/06/2015   PFIZER(Purple Top)SARS-COV-2 Vaccination 07/22/2019, 08/10/2019, 03/29/2020   PNEUMOCOCCAL CONJUGATE-20 04/22/2021   Pfizer Covid-19 Vaccine Bivalent Booster 50yr & up 04/22/2021   Pneumococcal Conjugate-13 09/24/2013, 04/20/2020   Pneumococcal Polysaccharide-23 07/03/2005, 07/04/2007, 04/18/2011   Td 06/02/2009   Zoster Recombinat (Shingrix) 04/09/2020   Zoster, Live 07/03/2006    Family History  Problem Relation Age of Onset   Suicidality Father        suicide death pt  was 3 yrs, 1950   Arrhythmia Mother    Hypertension Mother    Diabetes Mother    Dementia Mother    Heart attack Brother    Heart disease Paternal Aunt    Prostate cancer Maternal Grandfather    Diabetes Paternal Grandfather        fathers side of the family   [name] cancer Maternal Aunt      Current Outpatient Medications:    apixaban (ELIQUIS) 5 MG TABS tablet, Take 1 tablet (5 mg total) by mouth 2 (two) times daily., Disp: 180 tablet, Rfl: 1   atorvastatin (LIPITOR) 40 MG tablet, TAKE ONE TABLET BY MOUTH ONCE DAILY, Disp: 90 tablet, Rfl: 1   Continuous Blood Gluc Receiver (Chandler) DEVI, Use as directed while on insulin, Disp: 1 each, Rfl: 3   Continuous Blood Gluc Receiver (FREESTYLE LIBRE 14 DAY READER) DEVI, Use   14 days for diabetes control., Disp: 1 each, Rfl: 3   Continuous Blood Gluc Sensor (DEXCOM G7 SENSOR) MISC, Use as directed while on insulin, Disp: 1 each, Rfl: 3   diltiazem (CARDIZEM LA) 360 MG 24 hr tablet, Take 1 tablet (360 mg total) by mouth daily. KEEP UPCOMING APPOINTMENT FOR FUTURE REFILLS., Disp: 30 tablet, Rfl: 0   dofetilide (TIKOSYN) 250 MCG capsule, Take 1 capsule (250  mcg total) by mouth 2 (two) times daily. KEEP UPCOMING APPOINTMENT FOR FUTURE REFILLS., Disp: 180 capsule, Rfl: 3   DULoxetine (CYMBALTA) 60 MG capsule, Take 1 capsule (60 mg total) by mouth daily., Disp: 90 capsule, Rfl: 0   empagliflozin (JARDIANCE) 25 MG TABS tablet, Take 1 tablet (25 mg total) by mouth daily., Disp: 90 tablet, Rfl: 0   fluticasone (FLONASE) 50 MCG/ACT nasal spray, Place 1 spray into both nostrils daily as needed for allergies., Disp: , Rfl:    insulin degludec (TRESIBA) 100 UNIT/ML FlexTouch Pen, Inject 10 Units into the skin daily. (Patient taking differently: Inject 28 Units into the skin daily.), Disp: 3 mL, Rfl: 3   loratadine (CLARITIN) 10 MG tablet, TAKE 1 TABLET BY MOUTH EVERY DAY, Disp: 90 tablet, Rfl: 0   Lysine 1000 MG TABS, Take 1,000 mg by mouth at bedtime. , Disp: , Rfl:    metFORMIN (GLUCOPHAGE) 1000 MG tablet, Take 1 tablet (1,000 mg total) by mouth 2 (two) times daily., Disp: 180 tablet, Rfl: 0   metolazone (ZAROXOLYN) 2.5 MG tablet, TAKE 1 TABLET BY MOUTH EVERY 4  DAYS AS NEEDED FOR SWELLING  PLEASE KEEP UPCOMING APPOINTMENT FURTHER REFILLS, Disp: 25 tablet, Rfl: 2   metoprolol tartrate (LOPRESSOR) 50 MG tablet, Take 1 tablet by mouth in the morning and 2 tablets in the evening each day, Disp: 270 tablet, Rfl: 0   Multiple Vitamin (MULTIVITAMIN WITH MINERALS) TABS tablet, Take 1 tablet by mouth daily. Centrum, Disp: , Rfl:    nitroGLYCERIN (NITROSTAT) 0.4 MG SL tablet, Place 1 tablet (0.4 mg total) under the tongue every 5 (five) minutes x 3 doses as needed for chest pain. PLEASE CALL [phone] AND SCHEDULE AN OVERDUE APPT WITH DR Acie Fredrickson FOR FURTHER REFILLS TO BE GRANTED (2ND ATTEMPT), Disp: 15 tablet, Rfl: 0   NON FORMULARY, CPAP at bedtime, Disp: , Rfl:    nystatin-triamcinolone (MYCOLOG II) cream, Apply 1 application topically 2 (two) times daily as needed., Disp: 30 g, Rfl: 1   omeprazole (PRILOSEC) 20 MG capsule, Take 20 mg by mouth 2 (two) times daily.,  Disp: , Rfl:    Polyethyl Glycol-Propyl Glycol (SYSTANE) 0.4-0.3 %  SOLN, Place 1 drop into both eyes at bedtime., Disp: , Rfl:    potassium chloride SA (KLOR-CON M) 20 MEQ tablet, Take 1 tablet (20 mEq total) by mouth daily., Disp: 30 tablet, Rfl: 0   spironolactone (ALDACTONE) 25 MG tablet, Take 0.5 tablets (12.5 mg total) by mouth daily., Disp: 45 tablet, Rfl: 3   torsemide (DEMADEX) 20 MG tablet, Take 3 tablets (60 mg total) by mouth daily., Disp: 30 tablet, Rfl: 0   valACYclovir (VALTREX) 1000 MG tablet, TAKE 2 TABLETS BY MOUTH EVERY 12 HOURS AS NEEDED, Disp: 34 tablet, Rfl: 2      Objective:   Vitals:   04/04/22 0950  BP: 118/72  Pulse: 63  Temp: 99.1 F (37.3 C)  TempSrc: Oral  SpO2: 94%  Weight: 232 lb (105.2 kg)  Height: 5' 5.5" (1.664 m)    Estimated body mass index is 38.02 kg/m as calculated from the following:   Height as of this encounter: 5' 5.5" (1.664 m).   Weight as of this encounter: 232 lb (105.2 kg).  '@WEIGHTCHANGE'$ @  [employer]   04/04/22 0950  Weight: 232 lb (105.2 kg)     Physical Exam    General: No distress. Obese  On o2 Neuro: Alert and Oriented x 3. GCS 15. Speech normal Psych: Pleasant Resp:  Barrel Chest - no.  Wheeze - no, Crackles - YES, No overt respiratory distress CVS: Normal heart sounds. Murmurs - no Ext: Stigmata of Connective Tissue Disease - no HEENT: Normal upper airway. PEERL +. No post nasal drip        Assessment:       ICD-10-CM   1. Chronic respiratory failure with hypoxia (HCC)  J96.11     2. History of respiratory syncytial virus (RSV) infection  Z86.19     3. Chronic diastolic CHF (congestive heart failure) (HCC)  I50.32     4. 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 Dyspnea on exertion  R06.09     6. History of acute respiratory failure  Z87.09       If she has chronic hypersensitivity pneumonitis and now she is on oxygen -> then with progressive phenotype treatment is indicated.   Treatment agents would be prednisone versus CellCept and/or antifibrotic's nintedanib is indicated.  I went over these 3 medications with her and the side effect profile.  She is not ready to start these medications and she wants to reflect on these.  She found the conversation on this somewhat overwhelming.  To me she has groundglass opacities and the Aspergillus antibody is positive and with crackles and the chronicity of these groundglass opacities the  concern she has chronic hypersensitive pneumonitis/ILD is valid.  However, we need to ensure there is no ongoing persistent pulmonary venous hypertension which has been documented in the past.  In fact in 2018 radiologist thought that her features are more consistent with CHF.  The improvement in hypoxemia would also be more consistent with CHF.       Plan:     Patient Instructions     ICD-10-CM   1. Chronic respiratory failure with hypoxia (HCC)  J96.11     2. History of respiratory syncytial virus (RSV) infection  Z86.19     3. Chronic diastolic CHF (congestive heart failure) (HCC)  I50.32     4. 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 Dyspnea on exertion  R06.09     6. History of  acute respiratory failure  Z87.09        Concern is for chronic hypersensitivity pneumonitis but heart issues might confound Right heart cath July 2023 still showed pulmonary congestion   Plan  - do walk test on room air for distance to desaturaton  - continue 2-4L [place] at home  - will message Dr Johney Frame about repeating right heart cath - high dose flu shot 04/04/2022 - do full PFT first available  - talk to PCP [name], [name], MD about ozempic or wegovy - weight loss needed - discussed prednisone, cellcept and ofev  - shared decision making to hold off given side effect profile and pending further data   Followup  - 2-3 months but after PFT - 30 min visit  ( Level 05 visit: Estb 40-54 min   in  visit type: on-site physical  face to visit  in total care time and counseling or/and coordination of care by this undersigned MD - Dr Brand Males. This includes one or more of the following on this same day 04/04/2022: pre-charting, chart review, note writing, documentation discussion of test results, diagnostic or treatment recommendations, prognosis, risks and benefits of management options, instructions, education, compliance or risk-factor reduction. It excludes time spent by the [place] or office staff in the care of the patient. Actual time 38 min)   SIGNATURE    Dr. Brand Males, M.D., F.C.C.P,  Pulmonary and Critical Care Medicine Staff Physician, [place] Director - Interstitial Lung Disease  Program  P[facility] [place] at [place], [place], [address]  Pager: [phone], If no answer or between  15:00h - 7:00h: call [phone] Telephone: [phone]  10:51 AM 04/04/2022

## 2022-04-05 ENCOUNTER — Telehealth: Payer: Self-pay | Admitting: *Deleted

## 2022-04-05 NOTE — Telephone Encounter (Signed)
-----   Message from [name], MD sent at 04/05/2022  2:47 PM EDT ----- Can we get her set up for repeat RHC to evaluate response to therapy? Has pulmonary HTN and diastolic heart failure.  Thank you!!  -[name]

## 2022-04-05 NOTE — Telephone Encounter (Signed)
Per Dr. Johney Frame, pt will need an updated OV to get RHC arranged for Pulmonary HTN.   Scheduled the pt to come in and see Ambrose Pancoast NP for this Friday 10/6 at 1:55 pm.  She is aware to arrive 15 mins prior to this appt.   Pt will need an EKG, updated H&P, labs, and RHC set-up for this visit.  This was requested by her Pulmonologist Dr. Chase Caller.  Pt verbalized understanding and agrees with this plan.

## 2022-04-05 NOTE — Telephone Encounter (Signed)
Pt will come into the clinic on this Friday 10/6 to see Ambrose Pancoast NP for scheduling of [place].

## 2022-04-06 DIAGNOSIS — J8410 Pulmonary fibrosis, unspecified: Secondary | ICD-10-CM | POA: Diagnosis not present

## 2022-04-06 DIAGNOSIS — J9601 Acute respiratory failure with hypoxia: Secondary | ICD-10-CM | POA: Diagnosis not present

## 2022-04-06 DIAGNOSIS — J849 Interstitial pulmonary disease, unspecified: Secondary | ICD-10-CM | POA: Diagnosis not present

## 2022-04-06 NOTE — H&P (View-Only) (Signed)
Office Visit    Patient Name: [name] Date of Encounter: 04/07/2022  Primary Care Provider:  [name], MD Primary Cardiologist:  [name], MD Primary Electrophysiologist: Will Meredith Leeds, MD  Chief Complaint    [name] is a 76 y.o. female with PMH of CAD s/p BMS to mid RCA in 2004 and patent stent LHC in 2018 paroxysmal atrial fibrillation s/p AF ablation x3, currently managed on dofetilide, SSS s/p PPM, OSA, DM type II, CKD stage IIIb, obesity who presents today for pulmonary hypertension and right heart catheterization.  Past Medical History    Past Medical History:  Diagnosis Date   Anticoagulant long-term use    pradaxa   Anxiety    Arthritis    "fingers, lower back" (04/23/2017    CAD (coronary artery disease) 0938,1829   post PTCA with bare-metal stenting to mid RCA in December 2004      CHF (congestive heart failure) (HCC)    Chronic atrial fibrillation ([place]) 06/2007   Tachybradycardia pacemaker   Chronic kidney disease    10% function - ?R, other kidney is compensating     CVA (cerebral vascular accident) [facility]) [phone]   denies residual on 04/23/2017   Depression    Diplopia 06/19/2008   Qualifier: Diagnosis of  By: [name] MD, [name]    Dysrhythmia    ATRIAL FIBRILATION   Edema of lower extremity    Hyperlipidemia    Hypertension    Inferior myocardial infarction [facility])    acute inferior wall mi/other medical hx   Myocardial infarction [facility]) [phone]   Obesity    OSA on CPAP    last test- 2010   Pacemaker    Pneumonia 2014   tx. ----  [facility]   Pulmonary hypertension ([place])    moderate pulmonary hypertension by 10/2016 echo and 10/2013 cardiac cath   Shortness of breath    Skin cancer    "cut off right [place]; burned off LLE" (04/23/2017)   Sleep apnea    Spondylolisthesis    TIA (transient ischemic attack) 2008   Unspecified hemorrhoids without mention of complication 11/10/2583   Colonoscopy--Dr. Carlean Purl     Past Surgical History:  Procedure Laterality Date   ABDOMINAL HYSTERECTOMY     APPENDECTOMY  1984   ATRIAL FIBRILLATION ABLATION N/A 09/29/2016   Procedure: Atrial Fibrillation Ablation;  Surgeon: Will Meredith Leeds, MD;  Location: [place] CV LAB;  Service: Cardiovascular;  Laterality: N/A;   ATRIAL FIBRILLATION ABLATION N/A 02/07/2018   Procedure: ATRIAL FIBRILLATION ABLATION;  Surgeon: Constance Haw, MD;  Location: [place] CV LAB;  Service: Cardiovascular;  Laterality: N/A;   ATRIAL FIBRILLATION ABLATION N/A 03/13/2019   Procedure: ATRIAL FIBRILLATION ABLATION;  Surgeon: Constance Haw, MD;  Location: [place] CV LAB;  Service: Cardiovascular;  Laterality: N/A;   ATRIAL FIBRILLATION ABLATION N/A 07/07/2020   Procedure: ATRIAL FIBRILLATION ABLATION;  Surgeon: Constance Haw, MD;  Location: [place] CV LAB;  Service: Cardiovascular;  Laterality: N/A;   BACK SURGERY     CARDIOVERSION N/A 09/12/2017   Procedure: CARDIOVERSION;  Surgeon: Jerline Pain, MD;  Location: [facility] ENDOSCOPY;  Service: Cardiovascular;  Laterality: N/A;   CARDIOVERSION N/A 12/13/2017   Procedure: CARDIOVERSION;  Surgeon: Sanda Klein, MD;  Location: [place] ENDOSCOPY;  Service: Cardiovascular;  Laterality: N/A;   CARDIOVERSION N/A 02/18/2018   Procedure: CARDIOVERSION;  Surgeon: Dorothy Spark, MD;  Location: [place];  Service: Cardiovascular;  Laterality: N/A;  CARDIOVERSION N/A 05/20/2018   Procedure: CARDIOVERSION;  Surgeon: [name], MD;  Location: [facility] ENDOSCOPY;  Service: Cardiovascular;  Laterality: N/A;   CARDIOVERSION N/A 04/16/2019   Procedure: CARDIOVERSION;  Surgeon: Donato Heinz, MD;  Location: [place];  Service: Endoscopy;  Laterality: N/A;   CARDIOVERSION N/A 01/22/2020   Procedure: CARDIOVERSION;  Surgeon: Geralynn Rile, MD;  Location: [place];  Service: Cardiovascular;  Laterality: N/A;   CARDIOVERSION N/A 02/18/2020   Procedure:  CARDIOVERSION;  Surgeon: Elouise Munroe, MD;  Location: [facility] ENDOSCOPY;  Service: Cardiovascular;  Laterality: N/A;   CATARACT EXTRACTION W/ INTRAOCULAR LENS  IMPLANT, BILATERAL Bilateral 01/15/2017- 03/2017   CHOLECYSTECTOMY     CORONARY ANGIOPLASTY  X 2   CORONARY ANGIOPLASTY WITH [place]; ~ 2007; ?date   "1 stent; replaced stent; not sure when I got the last stent" (04/23/2017)   DOPPLER ECHOCARDIOGRAPHY  2009   ELECTROPHYSIOLOGIC STUDY N/A 03/31/2016   Procedure: Cardioversion;  Surgeon: Evans Lance, MD;  Location: [place] CV LAB;  Service: Cardiovascular;  Laterality: N/A;   ELECTROPHYSIOLOGIC STUDY N/A 08/04/2016   Procedure: Cardioversion;  Surgeon: Evans Lance, MD;  Location: [place] CV LAB;  Service: Cardiovascular;  Laterality: N/A;   INSERT / REPLACE / REMOVE PACEMAKER  06/2007   IR RADIOLOGY PERIPHERAL GUIDED IV START  01/31/2018   IR US GUIDE VASC ACCESS LEFT  01/31/2018   JOINT REPLACEMENT     LAPAROSCOPIC CHOLECYSTECTOMY  1994   LEFT AND RIGHT HEART CATHETERIZATION WITH CORONARY ANGIOGRAM N/A 10/06/2013   Procedure: LEFT AND RIGHT HEART CATHETERIZATION WITH CORONARY ANGIOGRAM;  Surgeon: Troy Sine, MD;  Location: [facility] CATH LAB;  Service: Cardiovascular;  Laterality: N/A;   LEFT OOPHORECTOMY Left ~ [place]  2000s - 04/2015 X 3   L3-4; L4-5; L2-3; Dr Trenton Gammon   RIGHT HEART CATH N/A 01/04/2022   Procedure: RIGHT HEART CATH;  Surgeon: Larey Dresser, MD;  Location: [place] CV LAB;  Service: Cardiovascular;  Laterality: N/A;   RIGHT/LEFT HEART CATH AND CORONARY ANGIOGRAPHY N/A 05/09/2017   Procedure: RIGHT/LEFT HEART CATH AND CORONARY ANGIOGRAPHY;  Surgeon: Sherren Mocha, MD;  Location: [place] CV LAB;  Service: Cardiovascular;  Laterality: N/A;   SKIN CANCER EXCISION Right    [place]   TEE WITHOUT CARDIOVERSION N/A 09/29/2016   Procedure: TRANSESOPHAGEAL ECHOCARDIOGRAM (TEE);  Surgeon: Jerline Pain, MD;  Location: [facility] ENDOSCOPY;   Service: Cardiovascular;  Laterality: N/A;   TOTAL ABDOMINAL HYSTERECTOMY  1984   "uterus & right ovary"   TOTAL KNEE ARTHROPLASTY Left 04/23/2017   TOTAL KNEE ARTHROPLASTY Left 04/23/2017   Procedure: TOTAL KNEE ARTHROPLASTY;  Surgeon: Frederik Pear, MD;  Location: [place];  Service: Orthopedics;  Laterality: Left;   ULTRASOUND GUIDANCE FOR VASCULAR ACCESS  05/09/2017   Procedure: Ultrasound Guidance For Vascular Access;  Surgeon: Sherren Mocha, MD;  Location: [place] CV LAB;  Service: Cardiovascular;;    Allergies  Allergies  Allergen Reactions   Hydrocodone-Guaifenesin Other (See Comments)    Confusion/ memory loss   Adhesive [Tape] Rash and Other (See Comments)    Allergic to defibrillation pads.   Pedi-Pre Tape Spray [Wound Dressing Adhesive] Other (See Comments) and Rash    Allergic to defibrillation pads.    History of Present Illness    [name]  is a 76 year old female with the above mention past medical history who presents today for pulmonary hypertension and arrangement of RHC  per pulmonologist.  Patient has remote history of CAD with BMS that was placed in 2004 to RCA with repeat cath in 2018 showing patent RCA stent with chronic occlusion of the small acute marginal branch.  Patient was hospitalized in 2021 due to respiratory failure secondary to RSV and bronchitis.  She was found to be in AF with RVR in the setting of respiratory infection.  Patient underwent a fourth ablation on 07/07/2020 with additional cardioversion to sinus rhythm.  She was recently admitted to the ED at [facility] on 12/10/2021 with dyspnea on exertion and found to be hypoxic.  Patient RHC performed by Dr. Aundra Dubin on 01/04/2022 that showed preserved cardiac output with elevated right and left heart pressures and moderate primary pulmonary venous hypertension.  Patient's torsemide was increased to 60 mg daily.  She was seen on 01/16/2022 at advanced heart failure clinic and during visit patient is feeling  well overall and was maintained on 3 L nasal cannula.  She was last seen by Dr. Johney Frame on 02/24/2022 for follow-up.  During visit patient endorsed feeling well with occasional lower extremity swelling.  She takes metolazone when swelling occurs with resolution of symptoms.  BP was well controlled with systolics greater than 818.  She was seen by Dr. Chase Caller on 04/04/2022 for consultation and shortness of breath.  [name] reports today for precatheterization appointment alone.  Since last being seen in the office patient reports she has been doing well and fluid volume has been stable with current regimen she takes Zaroxolyn every 4 days as needed for increased swelling.  She currently states her last dose was 1 week ago and fluid volume has remained stable.  She is euvolemic otherwise on examination with no complaints of chest pain or palpitations.  She is tolerating her current medications without adverse reactions.  During her visit we discussed the need for right heart catheterization for further evaluation of her pulmonary hypertension.  Patient had all questions answered to her satisfaction.  Patient denies chest pain, palpitations, dyspnea, PND, orthopnea, nausea, vomiting, dizziness, syncope, edema, weight gain, or early satiety.  Home Medications    Current Outpatient Medications  Medication Sig Dispense Refill   apixaban (ELIQUIS) 5 MG TABS tablet Take 1 tablet (5 mg total) by mouth 2 (two) times daily. 180 tablet 1   atorvastatin (LIPITOR) 40 MG tablet TAKE ONE TABLET BY MOUTH ONCE DAILY 90 tablet 1   Continuous Blood Gluc Receiver (DEXCOM G7 RECEIVER) DEVI Use as directed while on insulin 1 each 3   Continuous Blood Gluc Receiver (FREESTYLE LIBRE 14 DAY READER) DEVI Use   14 days for diabetes control. 1 each 3   Continuous Blood Gluc Sensor (DEXCOM G7 SENSOR) MISC Use as directed while on insulin 1 each 3   diltiazem (CARDIZEM LA) 360 MG 24 hr tablet Take 1 tablet (360 mg total) by  mouth daily. KEEP UPCOMING APPOINTMENT FOR FUTURE REFILLS. 30 tablet 0   dofetilide (TIKOSYN) 250 MCG capsule Take 1 capsule (250 mcg total) by mouth 2 (two) times daily. KEEP UPCOMING APPOINTMENT FOR FUTURE REFILLS. 180 capsule 3   DULoxetine (CYMBALTA) 60 MG capsule Take 1 capsule (60 mg total) by mouth daily. 90 capsule 0   empagliflozin (JARDIANCE) 25 MG TABS tablet Take 1 tablet (25 mg total) by mouth daily. 90 tablet 0   fluticasone (FLONASE) 50 MCG/ACT nasal spray Place 1 spray into both nostrils daily as needed for allergies.     insulin degludec (TRESIBA) 100 UNIT/ML FlexTouch  Pen Inject 10 Units into the skin daily. (Patient taking differently: Inject 28 Units into the skin daily.) 3 mL 3   loratadine (CLARITIN) 10 MG tablet TAKE 1 TABLET BY MOUTH EVERY DAY 90 tablet 0   Lysine 1000 MG TABS Take 1,000 mg by mouth at bedtime.      metFORMIN (GLUCOPHAGE) 1000 MG tablet Take 1 tablet (1,000 mg total) by mouth 2 (two) times daily. 180 tablet 0   metolazone (ZAROXOLYN) 2.5 MG tablet TAKE 1 TABLET BY MOUTH EVERY 4  DAYS AS NEEDED FOR SWELLING  PLEASE KEEP UPCOMING APPOINTMENT FURTHER REFILLS 25 tablet 2   metoprolol tartrate (LOPRESSOR) 50 MG tablet Take 1 tablet by mouth in the morning and 2 tablets in the evening each day 270 tablet 0   Multiple Vitamin (MULTIVITAMIN WITH MINERALS) TABS tablet Take 1 tablet by mouth daily. Centrum     nitroGLYCERIN (NITROSTAT) 0.4 MG SL tablet Place 1 tablet (0.4 mg total) under the tongue every 5 (five) minutes x 3 doses as needed for chest pain. PLEASE CALL [phone] AND SCHEDULE AN OVERDUE APPT WITH DR Acie Fredrickson FOR FURTHER REFILLS TO BE GRANTED (2ND ATTEMPT) 15 tablet 0   NON FORMULARY CPAP at bedtime     nystatin-triamcinolone (MYCOLOG II) cream Apply 1 application topically 2 (two) times daily as needed. 30 g 1   omeprazole (PRILOSEC) 20 MG capsule Take 20 mg by mouth 2 (two) times daily.     Polyethyl Glycol-Propyl Glycol (SYSTANE) 0.4-0.3 % SOLN Place 1  drop into both eyes at bedtime.     potassium chloride SA (KLOR-CON M) 20 MEQ tablet Take 1 tablet (20 mEq total) by mouth daily. 30 tablet 0   spironolactone (ALDACTONE) 25 MG tablet Take 0.5 tablets (12.5 mg total) by mouth daily. 45 tablet 3   torsemide (DEMADEX) 20 MG tablet Take 3 tablets (60 mg total) by mouth daily. 30 tablet 0   valACYclovir (VALTREX) 1000 MG tablet TAKE 2 TABLETS BY MOUTH EVERY 12 HOURS AS NEEDED 34 tablet 2   No current facility-administered medications for this visit.     Review of Systems  Please see the history of present illness.    (+) Chronic shortness of breath (+) Trace lower extremity swelling  All other systems reviewed and are otherwise negative except as noted above.  Physical Exam    Wt Readings from Last 3 Encounters:  04/07/22 234 lb (106.1 kg)  04/04/22 232 lb (105.2 kg)  02/24/22 237 lb 9.6 oz (107.8 kg)   VS: Vitals:   04/07/22 1401  BP: 124/72  Pulse: 60  SpO2: 92%  ,Body mass index is 37.2 kg/m.  Constitutional:      Appearance: Healthy appearance. Not in distress.  Neck:     Vascular: JVD normal.  Pulmonary:     Effort: Pulmonary effort is normal.     Breath sounds: No wheezing. No rales. Diminished in the bases Cardiovascular:     Normal rate. Regular rhythm. Normal S1. Normal S2.      Murmurs: There is no murmur.  Edema:    Peripheral edema absent.  Abdominal:     Palpations: Abdomen is soft non tender. There is no hepatomegaly.  Skin:    General: Skin is warm and dry.  Neurological:     General: No focal deficit present.     Mental Status: Alert and oriented to person, place and time.     Cranial Nerves: Cranial nerves are intact.  EKG/LABS/Other Studies Reviewed  ECG personally reviewed by me today -ventricular paced with rate of 60 bpm  Risk Assessment/Calculations:    CHA2DS2-VASc Score = 7   This indicates a 11.2% annual risk of stroke. The patient's score is based upon: CHF History: 1 HTN History:  1 Diabetes History: 1 Stroke History: 0 Vascular Disease History: 1 Age Score: 2 Gender Score: 1      Lab Results  Component Value Date   WBC 8.0 12/16/2021   HGB 14.3 01/04/2022   HGB 14.6 01/04/2022   HCT 42.0 01/04/2022   HCT 43.0 01/04/2022   MCV 95.7 12/16/2021   PLT 255 12/16/2021   Lab Results  Component Value Date   CREATININE 1.42 (H) 01/16/2022   BUN 18 01/16/2022   NA 138 01/16/2022   K 4.5 01/16/2022   CL 95 (L) 01/16/2022   CO2 32 01/16/2022   Lab Results  Component Value Date   ALT 46 (H) 12/26/2021   AST 36 12/26/2021   ALKPHOS 83 12/26/2021   BILITOT 0.7 12/26/2021   Lab Results  Component Value Date   CHOL 147 08/23/2021   HDL 47.20 08/23/2021   LDLCALC 68 04/10/2019   LDLDIRECT 76.0 08/23/2021   TRIG 206.0 (H) 08/23/2021   CHOLHDL 3 08/23/2021    Lab Results  Component Value Date   HGBA1C 7.7 (A) 12/26/2021    Assessment & Plan    1.  Chronic diastolic CHF: -2D echo was most recently completed 12/2008 with an EF of 60-65% with no RWMA, normal mitral valve structure with trivial mitral valve regurgitation -Today patient was euvolemic on examination and states that she has only had to use her Zaroxolyn a week ago for excess fluid volume in lower extremities -She is currently on GDMT with Jardiance, Aldactone, metoprolol 50 mg, Demadex and Zaroxolyn as needed -Low sodium diet, fluid restriction <2L, and daily weights encouraged. Educated to contact our office for weight gain of 2 lbs overnight or 5 lbs in one week.   2.  Coronary artery disease: -Status post LHC in 2004 with BMS to RCA and recent LHC in 2018 revealing patent RCA stent with CTO to small acute marginal branch with patent LAD and left circumflex -Patient endorses no chest pain today -Continue GDMT with atorvastatin 40 mg and currently not on ASA due to Eliquis for AF  3.  Pulmonary hypertension: -RHC scheduled today to be performed to evaluate PHT in the setting of heart  failure and interstitial lung disease -Patient is currently followed by Dr. Chase Caller for pulmonology -BMET, CBC today -Last TTE performed June 2023  4.  Tachybradycardia syndrome: -s/p [employer] Jude PPM -Last Paceart report with normal histogram  5.  History of atrial fibrillation/flutter: -Patient has had 3 AF ablations with last in 2022 followed by Dr. Curt Bears -She is currently on Tikosyn 250 mics twice daily and metoprolol 50 mg twice daily and Cardizem during to 60 mg daily for rate control -Continue Eliquis 5 mg twice daily as noted above -No bleeding reported and dose appropriate based on recent creatinine and CBC      Disposition: Follow-up with [name], MD or APP in 3 weeks Shared Decision Making/Informed Consent The risks [stroke (1 in 1000), death (1 in 1000), kidney failure [usually temporary] (1 in 500), bleeding (1 in 200), allergic reaction [possibly serious] (1 in 200)], benefits (diagnostic support and management of coronary artery disease) and alternatives of a cardiac catheterization were discussed in detail with [name] and she is willing to  proceed.   Medication Adjustments/Labs and Tests Ordered: Current medicines are reviewed at length with the patient today.  Concerns regarding medicines are outlined above.   Signed, Mable Fill, Marissa Nestle, NP 04/07/2022, 3:41 PM [facility]  Note:  This document was prepared using Dragon voice recognition software and may include unintentional dictation errors.

## 2022-04-06 NOTE — Progress Notes (Signed)
Office Visit    Patient Name: [name] Date of Encounter: 04/07/2022  Primary Care Provider:  [name], MD Primary Cardiologist:  [name], MD Primary Electrophysiologist: Will Meredith Leeds, MD  Chief Complaint    [name] is a 76 y.o. female with PMH of CAD s/p BMS to mid RCA in 2004 and patent stent LHC in 2018 paroxysmal atrial fibrillation s/p AF ablation x3, currently managed on dofetilide, SSS s/p PPM, OSA, DM type II, CKD stage IIIb, obesity who presents today for pulmonary hypertension and right heart catheterization.  Past Medical History    Past Medical History:  Diagnosis Date   Anticoagulant long-term use    pradaxa   Anxiety    Arthritis    "fingers, lower back" (04/23/2017    CAD (coronary artery disease) [id number]   post PTCA with bare-metal stenting to mid RCA in December 2004      CHF (congestive heart failure) (HCC)    Chronic atrial fibrillation ([place]) 06/2007   Tachybradycardia pacemaker   Chronic kidney disease    10% function - ?R, other kidney is compensating     CVA (cerebral vascular accident) [facility]) [phone]   denies residual on 04/23/2017   Depression    Diplopia 06/19/2008   Qualifier: Diagnosis of  By: [name] MD, [name]    Dysrhythmia    ATRIAL FIBRILATION   Edema of lower extremity    Hyperlipidemia    Hypertension    Inferior myocardial infarction [facility])    acute inferior wall mi/other medical hx   Myocardial infarction [facility]) [phone]   Obesity    OSA on CPAP    last test- 2010   Pacemaker    Pneumonia 2014   tx. ----  [facility]   Pulmonary hypertension ([place])    moderate pulmonary hypertension by 10/2016 echo and 10/2013 cardiac cath   Shortness of breath    Skin cancer    "cut off right Bodner; burned off LLE" (04/23/2017)   Sleep apnea    Spondylolisthesis    TIA (transient ischemic attack) 2008   Unspecified hemorrhoids without mention of complication 02/28/9370   Colonoscopy--Dr. Carlean Purl     Past Surgical History:  Procedure Laterality Date   ABDOMINAL HYSTERECTOMY     APPENDECTOMY  1984   ATRIAL FIBRILLATION ABLATION N/A 09/29/2016   Procedure: Atrial Fibrillation Ablation;  Surgeon: Will Meredith Leeds, MD;  Location: [place] CV LAB;  Service: Cardiovascular;  Laterality: N/A;   ATRIAL FIBRILLATION ABLATION N/A 02/07/2018   Procedure: ATRIAL FIBRILLATION ABLATION;  Surgeon: Constance Haw, MD;  Location: [place] CV LAB;  Service: Cardiovascular;  Laterality: N/A;   ATRIAL FIBRILLATION ABLATION N/A 03/13/2019   Procedure: ATRIAL FIBRILLATION ABLATION;  Surgeon: Constance Haw, MD;  Location: [place] CV LAB;  Service: Cardiovascular;  Laterality: N/A;   ATRIAL FIBRILLATION ABLATION N/A 07/07/2020   Procedure: ATRIAL FIBRILLATION ABLATION;  Surgeon: Constance Haw, MD;  Location: [place] CV LAB;  Service: Cardiovascular;  Laterality: N/A;   BACK SURGERY     CARDIOVERSION N/A 09/12/2017   Procedure: CARDIOVERSION;  Surgeon: Jerline Pain, MD;  Location: [facility] ENDOSCOPY;  Service: Cardiovascular;  Laterality: N/A;   CARDIOVERSION N/A 12/13/2017   Procedure: CARDIOVERSION;  Surgeon: Sanda Klein, MD;  Location: [place] ENDOSCOPY;  Service: Cardiovascular;  Laterality: N/A;   CARDIOVERSION N/A 02/18/2018   Procedure: CARDIOVERSION;  Surgeon: Dorothy Spark, MD;  Location: [place];  Service: Cardiovascular;  Laterality: N/A;  CARDIOVERSION N/A 05/20/2018   Procedure: CARDIOVERSION;  Surgeon: [name], MD;  Location: [facility] ENDOSCOPY;  Service: Cardiovascular;  Laterality: N/A;   CARDIOVERSION N/A 04/16/2019   Procedure: CARDIOVERSION;  Surgeon: Donato Heinz, MD;  Location: [place];  Service: Endoscopy;  Laterality: N/A;   CARDIOVERSION N/A 01/22/2020   Procedure: CARDIOVERSION;  Surgeon: Geralynn Rile, MD;  Location: [place];  Service: Cardiovascular;  Laterality: N/A;   CARDIOVERSION N/A 02/18/2020   Procedure:  CARDIOVERSION;  Surgeon: Elouise Munroe, MD;  Location: [facility] ENDOSCOPY;  Service: Cardiovascular;  Laterality: N/A;   CATARACT EXTRACTION W/ INTRAOCULAR LENS  IMPLANT, BILATERAL Bilateral 01/15/2017- 03/2017   CHOLECYSTECTOMY     CORONARY ANGIOPLASTY  X 2   CORONARY ANGIOPLASTY WITH [place]; ~ 2007; ?date   "1 stent; replaced stent; not sure when I got the last stent" (04/23/2017)   DOPPLER ECHOCARDIOGRAPHY  2009   ELECTROPHYSIOLOGIC STUDY N/A 03/31/2016   Procedure: Cardioversion;  Surgeon: Evans Lance, MD;  Location: [place] CV LAB;  Service: Cardiovascular;  Laterality: N/A;   ELECTROPHYSIOLOGIC STUDY N/A 08/04/2016   Procedure: Cardioversion;  Surgeon: Evans Lance, MD;  Location: [place] CV LAB;  Service: Cardiovascular;  Laterality: N/A;   INSERT / REPLACE / REMOVE PACEMAKER  06/2007   IR RADIOLOGY PERIPHERAL GUIDED IV START  01/31/2018   IR US GUIDE VASC ACCESS LEFT  01/31/2018   JOINT REPLACEMENT     LAPAROSCOPIC CHOLECYSTECTOMY  1994   LEFT AND RIGHT HEART CATHETERIZATION WITH CORONARY ANGIOGRAM N/A 10/06/2013   Procedure: LEFT AND RIGHT HEART CATHETERIZATION WITH CORONARY ANGIOGRAM;  Surgeon: Troy Sine, MD;  Location: [facility] CATH LAB;  Service: Cardiovascular;  Laterality: N/A;   LEFT OOPHORECTOMY Left ~ [place]  2000s - 04/2015 X 3   L3-4; L4-5; L2-3; Dr Trenton Gammon   RIGHT HEART CATH N/A 01/04/2022   Procedure: RIGHT HEART CATH;  Surgeon: Larey Dresser, MD;  Location: [place] CV LAB;  Service: Cardiovascular;  Laterality: N/A;   RIGHT/LEFT HEART CATH AND CORONARY ANGIOGRAPHY N/A 05/09/2017   Procedure: RIGHT/LEFT HEART CATH AND CORONARY ANGIOGRAPHY;  Surgeon: Sherren Mocha, MD;  Location: [place] CV LAB;  Service: Cardiovascular;  Laterality: N/A;   SKIN CANCER EXCISION Right    [place]   TEE WITHOUT CARDIOVERSION N/A 09/29/2016   Procedure: TRANSESOPHAGEAL ECHOCARDIOGRAM (TEE);  Surgeon: Jerline Pain, MD;  Location: [facility] ENDOSCOPY;   Service: Cardiovascular;  Laterality: N/A;   TOTAL ABDOMINAL HYSTERECTOMY  1984   "uterus & right ovary"   TOTAL KNEE ARTHROPLASTY Left 04/23/2017   TOTAL KNEE ARTHROPLASTY Left 04/23/2017   Procedure: TOTAL KNEE ARTHROPLASTY;  Surgeon: Frederik Pear, MD;  Location: [place];  Service: Orthopedics;  Laterality: Left;   ULTRASOUND GUIDANCE FOR VASCULAR ACCESS  05/09/2017   Procedure: Ultrasound Guidance For Vascular Access;  Surgeon: Sherren Mocha, MD;  Location: [place] CV LAB;  Service: Cardiovascular;;    Allergies  Allergies  Allergen Reactions   Hydrocodone-Guaifenesin Other (See Comments)    Confusion/ memory loss   Adhesive [Tape] Rash and Other (See Comments)    Allergic to defibrillation pads.   Pedi-Pre Tape Spray [Wound Dressing Adhesive] Other (See Comments) and Rash    Allergic to defibrillation pads.    History of Present Illness    [name]  is a 76 year old female with the above mention past medical history who presents today for pulmonary hypertension and arrangement of RHC  per pulmonologist.  Patient has remote history of CAD with BMS that was placed in 2004 to RCA with repeat cath in 2018 showing patent RCA stent with chronic occlusion of the small acute marginal branch.  Patient was hospitalized in 2021 due to respiratory failure secondary to RSV and bronchitis.  She was found to be in AF with RVR in the setting of respiratory infection.  Patient underwent a fourth ablation on 07/07/2020 with additional cardioversion to sinus rhythm.  She was recently admitted to the ED at [facility] on 12/10/2021 with dyspnea on exertion and found to be hypoxic.  Patient RHC performed by Dr. Aundra Dubin on 01/04/2022 that showed preserved cardiac output with elevated right and left heart pressures and moderate primary pulmonary venous hypertension.  Patient's torsemide was increased to 60 mg daily.  She was seen on 01/16/2022 at advanced heart failure clinic and during visit patient is feeling  well overall and was maintained on 3 L nasal cannula.  She was last seen by Dr. Johney Frame on 02/24/2022 for follow-up.  During visit patient endorsed feeling well with occasional lower extremity swelling.  She takes metolazone when swelling occurs with resolution of symptoms.  BP was well controlled with systolics greater than 384.  She was seen by Dr. Chase Caller on 04/04/2022 for consultation and shortness of breath.  Ms. Bratcher reports today for precatheterization appointment alone.  Since last being seen in the office patient reports she has been doing well and fluid volume has been stable with current regimen she takes Zaroxolyn every 4 days as needed for increased swelling.  She currently states her last dose was 1 week ago and fluid volume has remained stable.  She is euvolemic otherwise on examination with no complaints of chest pain or palpitations.  She is tolerating her current medications without adverse reactions.  During her visit we discussed the need for right heart catheterization for further evaluation of her pulmonary hypertension.  Patient had all questions answered to her satisfaction.  Patient denies chest pain, palpitations, dyspnea, PND, orthopnea, nausea, vomiting, dizziness, syncope, edema, weight gain, or early satiety.  Home Medications    Current Outpatient Medications  Medication Sig Dispense Refill   apixaban (ELIQUIS) 5 MG TABS tablet Take 1 tablet (5 mg total) by mouth 2 (two) times daily. 180 tablet 1   atorvastatin (LIPITOR) 40 MG tablet TAKE ONE TABLET BY MOUTH ONCE DAILY 90 tablet 1   Continuous Blood Gluc Receiver (DEXCOM G7 RECEIVER) DEVI Use as directed while on insulin 1 each 3   Continuous Blood Gluc Receiver (FREESTYLE LIBRE 14 DAY READER) DEVI Use   14 days for diabetes control. 1 each 3   Continuous Blood Gluc Sensor (DEXCOM G7 SENSOR) MISC Use as directed while on insulin 1 each 3   diltiazem (CARDIZEM LA) 360 MG 24 hr tablet Take 1 tablet (360 mg total) by  mouth daily. KEEP UPCOMING APPOINTMENT FOR FUTURE REFILLS. 30 tablet 0   dofetilide (TIKOSYN) 250 MCG capsule Take 1 capsule (250 mcg total) by mouth 2 (two) times daily. KEEP UPCOMING APPOINTMENT FOR FUTURE REFILLS. 180 capsule 3   DULoxetine (CYMBALTA) 60 MG capsule Take 1 capsule (60 mg total) by mouth daily. 90 capsule 0   empagliflozin (JARDIANCE) 25 MG TABS tablet Take 1 tablet (25 mg total) by mouth daily. 90 tablet 0   fluticasone (FLONASE) 50 MCG/ACT nasal spray Place 1 spray into both nostrils daily as needed for allergies.     insulin degludec (TRESIBA) 100 UNIT/ML FlexTouch  Pen Inject 10 Units into the skin daily. (Patient taking differently: Inject 28 Units into the skin daily.) 3 mL 3   loratadine (CLARITIN) 10 MG tablet TAKE 1 TABLET BY MOUTH EVERY DAY 90 tablet 0   Lysine 1000 MG TABS Take 1,000 mg by mouth at bedtime.      metFORMIN (GLUCOPHAGE) 1000 MG tablet Take 1 tablet (1,000 mg total) by mouth 2 (two) times daily. 180 tablet 0   metolazone (ZAROXOLYN) 2.5 MG tablet TAKE 1 TABLET BY MOUTH EVERY 4  DAYS AS NEEDED FOR SWELLING  PLEASE KEEP UPCOMING APPOINTMENT FURTHER REFILLS 25 tablet 2   metoprolol tartrate (LOPRESSOR) 50 MG tablet Take 1 tablet by mouth in the morning and 2 tablets in the evening each day 270 tablet 0   Multiple Vitamin (MULTIVITAMIN WITH MINERALS) TABS tablet Take 1 tablet by mouth daily. Centrum     nitroGLYCERIN (NITROSTAT) 0.4 MG SL tablet Place 1 tablet (0.4 mg total) under the tongue every 5 (five) minutes x 3 doses as needed for chest pain. PLEASE CALL [phone] AND SCHEDULE AN OVERDUE APPT WITH DR Acie Fredrickson FOR FURTHER REFILLS TO BE GRANTED (2ND ATTEMPT) 15 tablet 0   NON FORMULARY CPAP at bedtime     nystatin-triamcinolone (MYCOLOG II) cream Apply 1 application topically 2 (two) times daily as needed. 30 g 1   omeprazole (PRILOSEC) 20 MG capsule Take 20 mg by mouth 2 (two) times daily.     Polyethyl Glycol-Propyl Glycol (SYSTANE) 0.4-0.3 % SOLN Place 1  drop into both eyes at bedtime.     potassium chloride SA (KLOR-CON M) 20 MEQ tablet Take 1 tablet (20 mEq total) by mouth daily. 30 tablet 0   spironolactone (ALDACTONE) 25 MG tablet Take 0.5 tablets (12.5 mg total) by mouth daily. 45 tablet 3   torsemide (DEMADEX) 20 MG tablet Take 3 tablets (60 mg total) by mouth daily. 30 tablet 0   valACYclovir (VALTREX) 1000 MG tablet TAKE 2 TABLETS BY MOUTH EVERY 12 HOURS AS NEEDED 34 tablet 2   No current facility-administered medications for this visit.     Review of Systems  Please see the history of present illness.    (+) Chronic shortness of breath (+) Trace lower extremity swelling  All other systems reviewed and are otherwise negative except as noted above.  Physical Exam    Wt Readings from Last 3 Encounters:  04/07/22 234 lb (106.1 kg)  04/04/22 232 lb (105.2 kg)  02/24/22 237 lb 9.6 oz (107.8 kg)   VS: Vitals:   04/07/22 1401  BP: 124/72  Pulse: 60  SpO2: 92%  ,Body mass index is 37.2 kg/m.  Constitutional:      Appearance: Healthy appearance. Not in distress.  Neck:     Vascular: JVD normal.  Pulmonary:     Effort: Pulmonary effort is normal.     Breath sounds: No wheezing. No rales. Diminished in the bases Cardiovascular:     Normal rate. Regular rhythm. Normal S1. Normal S2.      Murmurs: There is no murmur.  Edema:    Peripheral edema absent.  Abdominal:     Palpations: Abdomen is soft non tender. There is no hepatomegaly.  Skin:    General: Skin is warm and dry.  Neurological:     General: No focal deficit present.     Mental Status: Alert and oriented to person, place and time.     Cranial Nerves: Cranial nerves are intact.  EKG/LABS/Other Studies Reviewed  ECG personally reviewed by me today -ventricular paced with rate of 60 bpm  Risk Assessment/Calculations:    CHA2DS2-VASc Score = 7   This indicates a 11.2% annual risk of stroke. The patient's score is based upon: CHF History: 1 HTN History:  1 Diabetes History: 1 Stroke History: 0 Vascular Disease History: 1 Age Score: 2 Gender Score: 1      Lab Results  Component Value Date   WBC 8.0 12/16/2021   HGB 14.3 01/04/2022   HGB 14.6 01/04/2022   HCT 42.0 01/04/2022   HCT 43.0 01/04/2022   MCV 95.7 12/16/2021   PLT 255 12/16/2021   Lab Results  Component Value Date   CREATININE 1.42 (H) 01/16/2022   BUN 18 01/16/2022   NA 138 01/16/2022   K 4.5 01/16/2022   CL 95 (L) 01/16/2022   CO2 32 01/16/2022   Lab Results  Component Value Date   ALT 46 (H) 12/26/2021   AST 36 12/26/2021   ALKPHOS 83 12/26/2021   BILITOT 0.7 12/26/2021   Lab Results  Component Value Date   CHOL 147 08/23/2021   HDL 47.20 08/23/2021   LDLCALC 68 04/10/2019   LDLDIRECT 76.0 08/23/2021   TRIG 206.0 (H) 08/23/2021   CHOLHDL 3 08/23/2021    Lab Results  Component Value Date   HGBA1C 7.7 (A) 12/26/2021    Assessment & Plan    1.  Chronic diastolic CHF: -2D echo was most recently completed 12/2008 with an EF of 60-65% with no RWMA, normal mitral valve structure with trivial mitral valve regurgitation -Today patient was euvolemic on examination and states that she has only had to use her Zaroxolyn a week ago for excess fluid volume in lower extremities -She is currently on GDMT with Jardiance, Aldactone, metoprolol 50 mg, Demadex and Zaroxolyn as needed -Low sodium diet, fluid restriction <2L, and daily weights encouraged. Educated to contact our office for weight gain of 2 lbs overnight or 5 lbs in one week.   2.  Coronary artery disease: -Status post LHC in 2004 with BMS to RCA and recent LHC in 2018 revealing patent RCA stent with CTO to small acute marginal branch with patent LAD and left circumflex -Patient endorses no chest pain today -Continue GDMT with atorvastatin 40 mg and currently not on ASA due to Eliquis for AF  3.  Pulmonary hypertension: -RHC scheduled today to be performed to evaluate PHT in the setting of heart  failure and interstitial lung disease -Patient is currently followed by Dr. Chase Caller for pulmonology -BMET, CBC today -Last TTE performed June 2023  4.  Tachybradycardia syndrome: -s/p [employer] Jude PPM -Last Paceart report with normal histogram  5.  History of atrial fibrillation/flutter: -Patient has had 3 AF ablations with last in 2022 followed by Dr. Curt Bears -She is currently on Tikosyn 250 mics twice daily and metoprolol 50 mg twice daily and Cardizem during to 60 mg daily for rate control -Continue Eliquis 5 mg twice daily as noted above -No bleeding reported and dose appropriate based on recent creatinine and CBC      Disposition: Follow-up with [name], MD or APP in 3 weeks Shared Decision Making/Informed Consent The risks [stroke (1 in 1000), death (1 in 1000), kidney failure [usually temporary] (1 in 500), bleeding (1 in 200), allergic reaction [possibly serious] (1 in 200)], benefits (diagnostic support and management of coronary artery disease) and alternatives of a cardiac catheterization were discussed in detail with Ms. Lallier and she is willing to  proceed.   Medication Adjustments/Labs and Tests Ordered: Current medicines are reviewed at length with the patient today.  Concerns regarding medicines are outlined above.   Signed, Mable Fill, Marissa Nestle, NP 04/07/2022, 3:41 PM [facility]  Note:  This document was prepared using Dragon voice recognition software and may include unintentional dictation errors.

## 2022-04-07 ENCOUNTER — Ambulatory Visit: Payer: Medicare Other | Attending: Nurse Practitioner | Admitting: Nurse Practitioner

## 2022-04-07 ENCOUNTER — Other Ambulatory Visit: Payer: Self-pay | Admitting: Internal Medicine

## 2022-04-07 ENCOUNTER — Encounter: Payer: Self-pay | Admitting: Nurse Practitioner

## 2022-04-07 VITALS — BP 124/72 | HR 60 | Ht 66.5 in | Wt 234.0 lb

## 2022-04-07 DIAGNOSIS — I2510 Atherosclerotic heart disease of native coronary artery without angina pectoris: Secondary | ICD-10-CM | POA: Diagnosis not present

## 2022-04-07 DIAGNOSIS — Z01812 Encounter for preprocedural laboratory examination: Secondary | ICD-10-CM

## 2022-04-07 DIAGNOSIS — I272 Pulmonary hypertension, unspecified: Secondary | ICD-10-CM

## 2022-04-07 DIAGNOSIS — I495 Sick sinus syndrome: Secondary | ICD-10-CM

## 2022-04-07 DIAGNOSIS — I5032 Chronic diastolic (congestive) heart failure: Secondary | ICD-10-CM

## 2022-04-07 MED ORDER — SODIUM CHLORIDE 0.9% FLUSH: 3.0000 mL | Freq: Two times a day (BID) | INTRAVENOUS | Status: DC

## 2022-04-07 NOTE — Patient Instructions (Addendum)
Medication Instructions:  Your physician recommends that you continue on your current medications as directed. Please refer to the Current Medication list given to you today. See instructions below  *If you need a refill on your cardiac medications before your next appointment, please call your pharmacy*   Lab Work: Lab work to be done today--BMP and CBC If you have labs (blood work) drawn today and your tests are completely normal, you will receive your results only by: [place] (if you have MyChart) OR A paper copy in the mail If you have any lab test that is abnormal or we need to change your treatment, we will call you to review the results.   Testing/Procedures: Your physician has requested that you have a cardiac catheterization. Cardiac catheterization is used to diagnose and/or treat various heart conditions. Doctors may recommend this procedure for a number of different reasons. The most common reason is to evaluate chest pain. Chest pain can be a symptom of coronary artery disease (CAD), and cardiac catheterization can show whether plaque is narrowing or blocking your heart's arteries. This procedure is also used to evaluate the valves, as well as measure the blood flow and oxygen levels in different parts of your heart. For further information please visit [email or web address]. Please follow instruction sheet, as given. Scheduled for April 11, 2022    Follow-Up: At [facility], you and your health needs are our priority.  As part of our continuing mission to provide you with exceptional heart care, we have created designated Provider Care Teams.  These Care Teams include your primary Cardiologist (physician) and Advanced Practice Providers (APPs -  Physician Assistants and Nurse Practitioners) who all work together to provide you with the care you need, when you need it.  We recommend signing up for the patient portal called "MyChart".  Sign up information is  provided on this After Visit Summary.  MyChart is used to connect with patients for Virtual Visits (Telemedicine).  Patients are able to view lab/test results, encounter notes, upcoming appointments, etc.  Non-urgent messages can be sent to your provider as well.   To learn more about what you can do with MyChart, go to [email or web address].    Your next appointment:   3 week(s)  The format for your next appointment:   In Person  Provider:   [name], MD     Other Instructions       Cardiac/Peripheral Catheterization   You are scheduled for a Cardiac Catheterization on Tuesday, October 10 with Dr. Shelva Majestic.  1. Please arrive at the Main Entrance A at [facility]: [place], [place] [address] on October 10 at 5:30 AM (This time is two hours before your procedure to ensure your preparation). Free valet parking service is available. You will check in at ADMITTING. The support person will be asked to wait in the waiting room.  It is OK to have someone drop you off and come back when you are ready to be discharged.        Special note: Every effort is made to have your procedure done on time. Please understand that emergencies sometimes delay scheduled procedures.   . 2. Diet: Do not eat solid foods after midnight.  You may have clear liquids until 5 AM the day of the procedure.  3. Labs: done in office on 10/6  4. Medication instructions in preparation for your procedure:   Contrast Allergy: No  Stop taking Eliquis after evening  dose on April 08, 2022 Do not take insulin or any diabetes medication the morning of the procedure. Do not take torsemide, metolazone and potassium the morning of the procedure.    On the morning of your procedure, take Aspirin 81 mg and any morning medicines NOT listed above.  You may use sips of water.  5. Plan to go home the same day, you will only stay overnight if medically necessary. 6. You MUST have a  responsible adult to drive you home. 7. An adult MUST be with you the first 24 hours after you arrive home. 8. Bring a current list of your medications, and the last time and date medication taken. 9. Bring ID and current insurance cards. 10.Please wear clothes that are easy to get on and off and wear slip-on shoes.  Thank you for allowing [place] to care for you!   -- [place]   Important Information About Sugar

## 2022-04-08 LAB — BASIC METABOLIC PANEL
BUN/Creatinine Ratio: 18 (ref 12–28)
BUN: 26 mg/dL (ref 8–27)
CO2: 25 mmol/L (ref 20–29)
Calcium: 9.6 mg/dL (ref 8.7–10.3)
Chloride: 94 mmol/L — ABNORMAL LOW (ref 96–106)
Creatinine, Ser: 1.46 mg/dL — ABNORMAL HIGH (ref 0.57–1.00)
Glucose: 130 mg/dL — ABNORMAL HIGH (ref 70–99)
Potassium: 4.4 mmol/L (ref 3.5–5.2)
Sodium: 138 mmol/L (ref 134–144)
eGFR: 37 mL/min/{1.73_m2} — ABNORMAL LOW (ref >59)

## 2022-04-08 LAB — CBC
Hematocrit: 46 % (ref 34.0–46.6)
Hemoglobin: 15.6 g/dL (ref 11.1–15.9)
MCH: 30.9 pg (ref 26.6–33.0)
MCHC: 33.9 g/dL (ref 31.5–35.7)
MCV: 91 fL (ref 79–97)
Platelets: 283 10*3/uL (ref 150–450)
RBC: 5.05 x10E6/uL (ref 3.77–5.28)
RDW: 12.4 % (ref 11.7–15.4)
WBC: 8.5 10*3/uL (ref 3.4–10.8)

## 2022-04-10 ENCOUNTER — Other Ambulatory Visit: Payer: Self-pay | Admitting: Nurse Practitioner

## 2022-04-10 ENCOUNTER — Telehealth: Payer: Self-pay | Admitting: Cardiovascular Disease

## 2022-04-10 ENCOUNTER — Other Ambulatory Visit: Payer: Self-pay

## 2022-04-10 ENCOUNTER — Telehealth: Payer: Self-pay

## 2022-04-10 DIAGNOSIS — Z01812 Encounter for preprocedural laboratory examination: Secondary | ICD-10-CM

## 2022-04-10 MED ORDER — INSULIN DEGLUDEC 100 UNIT/ML ~~LOC~~ SOPN: PEN_INJECTOR | SUBCUTANEOUS | 3 refills | Status: DC

## 2022-04-10 NOTE — Telephone Encounter (Signed)
Contacted the pharmacy and spoke to [place]. Inform him to cancelled the Metformin that was sent in today.   Was trying to see if he can removed the automatic refills as patient expressed she didn't it. He states he is not able to.

## 2022-04-10 NOTE — Telephone Encounter (Signed)
Pt is requesting call back in regards to upcoming heart cath procedure on 10/10. She states that she has developed a cough and would like to know if this is going to be okay with continuation of said procedure.

## 2022-04-10 NOTE — Addendum Note (Signed)
Addended by: Aris Georgia, Arlyss Weathersby L on: 04/10/2022 12:15 PM   Modules accepted: Orders

## 2022-04-10 NOTE — Telephone Encounter (Signed)
Called patient back about her message. Patient will reschedule her right heart cath to next week, due to having a cough and sore throat. Patient stated she was negative for covid, but she got 4 vaccines last week and she is not sure if she has RSV again. Patient stated she should be fine in a week. Reschedule with the same doctor. Informed patient that her lab work should still be good, but if it is needing rechecked we will contact her. Patient is to check mychart for her updated instruction letter. Will send message to Jaquelyn Bitter about repeat lab work.

## 2022-04-10 NOTE — Telephone Encounter (Signed)
Placed order for BMET, per Ambrose Pancoast NP. Patient will get BMET on Monday prior to cath.

## 2022-04-11 ENCOUNTER — Telehealth (INDEPENDENT_AMBULATORY_CARE_PROVIDER_SITE_OTHER): Payer: Medicare Other | Admitting: Family Medicine

## 2022-04-11 ENCOUNTER — Encounter: Payer: Self-pay | Admitting: Family Medicine

## 2022-04-11 VITALS — HR 65 | Ht 66.5 in | Wt 230.0 lb

## 2022-04-11 DIAGNOSIS — R0981 Nasal congestion: Secondary | ICD-10-CM | POA: Diagnosis not present

## 2022-04-11 DIAGNOSIS — R059 Cough, unspecified: Secondary | ICD-10-CM

## 2022-04-11 DIAGNOSIS — R0982 Postnasal drip: Secondary | ICD-10-CM | POA: Diagnosis not present

## 2022-04-11 MED ORDER — BENZONATATE 100 MG PO CAPS: ORAL_CAPSULE | ORAL | 0 refills | Status: DC

## 2022-04-11 NOTE — Patient Instructions (Signed)
  HOME CARE TIPS:  -COVID19 testing information: [email or web address]  Most pharmacies also offer testing and home test kits. If the Covid19 test is positive and you desire antiviral treatment, please contact a [facility] or schedule a follow up virtual visit through your primary care office or through the [employer].  Other test to treat options: [email or web address]  -I sent the medication(s) we discussed to your pharmacy: Meds ordered this encounter  Medications   benzonatate (TESSALON PERLES) 100 MG capsule    Sig: 1-2 capsules up to twice daily as needed for cough.    Dispense:  30 capsule    Refill:  0    -monitor oxygen  -let your lung and heart doctors know about your symptoms  -nasal saline sinus rinses twice daily  -follow up with your doctor in 1-2 days unless improving and feeling better   It was nice to meet you today, and I really hope you are feeling better soon. I help [facility] out with telemedicine visits on Tuesdays and Thursdays and am happy to help if you need a follow up virtual visit on those days. Otherwise, if you have any concerns or questions following this visit please schedule a follow up visit with your Primary Care doctor or seek care at a local urgent care clinic to avoid delays in care.    Seek in person care or schedule a follow up video visit promptly if your symptoms worsen, new concerns arise or you are not improving with treatment. Call 911 and/or seek emergency care if your symptoms are severe or life threatening.

## 2022-04-11 NOTE — Progress Notes (Signed)
Virtual Visit via Video Note  I connected with Serin  on 04/11/22 at  1:20 PM EDT by a video enabled telemedicine application and verified that I am speaking with the correct person using two identifiers.  Location patient: [place] Location provider:work or home office Persons participating in the virtual visit: patient, provider  I discussed the limitations and requested verbal permission for telemedicine visit. The patient expressed understanding and agreed to proceed.   HPI:  Acute telemedicine visit for cough and congestion: -Onset:3 days ago -Symptoms include: nasal congestion, cough - mildly productive, tickle in throat/sore throat mild at times -Denies:Cp, SOB, fevers, swelling, wt gain, chest discomfort, chills, body aches, wheezing, malaise or decreased energy -O2 level has been at her baseline per her report, is > 90 on O2 and she even did ok off of her O2 (has chronic lung disease, is waiting on heart cath as cardiology and pulmonology are trying to sort out weather her issues are lung related or heart related.) -Pertinent past medical history: see below -Pertinent medication allergies: Allergies  Allergen Reactions   Hydrocodone-Guaifenesin Other (See Comments)    Confusion/ memory loss   Adhesive [Tape] Rash and Other (See Comments)    Allergic to defibrillation pads.  -COVID-19 vaccine status:  Immunization History  Administered Date(s) Administered   Fluad Quad(high Dose 65+) 04/10/2019, 04/20/2021, 04/04/2022   Influenza Split 04/18/2011, 05/06/2012   Influenza Whole 07/04/2007, 06/02/2009, 03/01/2010   Influenza, High Dose Seasonal PF 04/03/2014, 04/18/2016, 04/05/2017, 05/06/2018   Influenza,inj,Quad PF,6+ Mos 03/07/2013, 04/06/2015   PFIZER Comirnaty(Gray Top)Covid-19 Tri-Sucrose Vaccine 04/05/2022   PFIZER(Purple Top)SARS-COV-2 Vaccination 07/22/2019, 08/10/2019, 03/29/2020   PNEUMOCOCCAL CONJUGATE-20 04/22/2021   Pfizer Covid-19 Vaccine Bivalent Booster 66yr & up  04/22/2021   Pneumococcal Conjugate-13 09/24/2013, 04/20/2020   Pneumococcal Polysaccharide-23 07/03/2005, 07/04/2007, 04/18/2011, 04/05/2022   Respiratory Syncytial Virus Vaccine,Recomb Aduvanted(Arexvy) 04/05/2022   Td 06/02/2009   Zoster Recombinat (Shingrix) 04/09/2020   Zoster, Live 07/03/2006     ROS: See pertinent positives and negatives per HPI.  Past Medical History:  Diagnosis Date   Anticoagulant long-term use    pradaxa   Anxiety    Arthritis    "fingers, lower back" (04/23/2017    CAD (coronary artery disease) 11610,9604  post PTCA with bare-metal stenting to mid RCA in December 2004      CHF (congestive heart failure) (HCC)    Chronic atrial fibrillation ([place] 06[place]2007   Tachybradycardia pacemaker   Chronic kidney disease    10% function - ?R, other kidney is compensating     CVA (cerebral vascular accident) ([facility] [address]  denies residual on 04/23/2017   Depression    Diplopia 06/19/2008   Qualifier: Diagnosis of  By: PRegis BillMD, WStandley Brooking   Dysrhythmia    ATRIAL FIBRILATION   Edema of lower extremity    Hyperlipidemia    Hypertension    Inferior myocardial infarction ([facility]    acute inferior wall mi/other medical hx   Myocardial infarction ([facility] 11478,2956  Obesity    OSA on CPAP    last test- 2010   Pacemaker    [facility] 2014   [facility]  Pulmonary hypertension ([facility]    moderate pulmonary hypertension by 10/2016 echo and 10/2013 cardiac cath   Shortness of breath    Skin cancer    "cut off right A[place]; burned off LLE" (04/23/2017)   Sleep apnea    Spondylolisthesis    TIA (transient ischemic attack) 2008  Unspecified hemorrhoids without mention of complication 3/61/4431   Colonoscopy--Dr. Carlean Purl     Past Surgical History:  Procedure Laterality Date   ABDOMINAL HYSTERECTOMY     APPENDECTOMY  1984   ATRIAL FIBRILLATION ABLATION N/A 09/29/2016   Procedure: Atrial Fibrillation Ablation;  Surgeon: Will Meredith Leeds, MD;   Location: [place] CV LAB;  Service: Cardiovascular;  Laterality: N/A;   ATRIAL FIBRILLATION ABLATION N/A 02/07/2018   Procedure: ATRIAL FIBRILLATION ABLATION;  Surgeon: Constance Haw, MD;  Location: [place] CV LAB;  Service: Cardiovascular;  Laterality: N/A;   ATRIAL FIBRILLATION ABLATION N/A 03/13/2019   Procedure: ATRIAL FIBRILLATION ABLATION;  Surgeon: Constance Haw, MD;  Location: [place] CV LAB;  Service: Cardiovascular;  Laterality: N/A;   ATRIAL FIBRILLATION ABLATION N/A 07/07/2020   Procedure: ATRIAL FIBRILLATION ABLATION;  Surgeon: Constance Haw, MD;  Location: [place] CV LAB;  Service: Cardiovascular;  Laterality: N/A;   BACK SURGERY     CARDIOVERSION N/A 09/12/2017   Procedure: CARDIOVERSION;  Surgeon: Jerline Pain, MD;  Location: [facility] ENDOSCOPY;  Service: Cardiovascular;  Laterality: N/A;   CARDIOVERSION N/A 12/13/2017   Procedure: CARDIOVERSION;  Surgeon: Sanda Klein, MD;  Location: [place] ENDOSCOPY;  Service: Cardiovascular;  Laterality: N/A;   CARDIOVERSION N/A 02/18/2018   Procedure: CARDIOVERSION;  Surgeon: Dorothy Spark, MD;  Location: [facility] ENDOSCOPY;  Service: Cardiovascular;  Laterality: N/A;   CARDIOVERSION N/A 05/20/2018   Procedure: CARDIOVERSION;  Surgeon: [name], MD;  Location: [facility] ENDOSCOPY;  Service: Cardiovascular;  Laterality: N/A;   CARDIOVERSION N/A 04/16/2019   Procedure: CARDIOVERSION;  Surgeon: Donato Heinz, MD;  Location: [place];  Service: Endoscopy;  Laterality: N/A;   CARDIOVERSION N/A 01/22/2020   Procedure: CARDIOVERSION;  Surgeon: Geralynn Rile, MD;  Location: [place];  Service: Cardiovascular;  Laterality: N/A;   CARDIOVERSION N/A 02/18/2020   Procedure: CARDIOVERSION;  Surgeon: Elouise Munroe, MD;  Location: [facility] ENDOSCOPY;  Service: Cardiovascular;  Laterality: N/A;   CATARACT EXTRACTION W/ INTRAOCULAR LENS  IMPLANT, BILATERAL Bilateral 01/15/2017- 03/2017   CHOLECYSTECTOMY      CORONARY ANGIOPLASTY  X 2   CORONARY ANGIOPLASTY WITH [place]; ~ 2007; ?date   "1 stent; replaced stent; not sure when I got the last stent" (04/23/2017)   DOPPLER ECHOCARDIOGRAPHY  2009   ELECTROPHYSIOLOGIC STUDY N/A 03/31/2016   Procedure: Cardioversion;  Surgeon: Evans Lance, MD;  Location: [place] CV LAB;  Service: Cardiovascular;  Laterality: N/A;   ELECTROPHYSIOLOGIC STUDY N/A 08/04/2016   Procedure: Cardioversion;  Surgeon: Evans Lance, MD;  Location: [place] CV LAB;  Service: Cardiovascular;  Laterality: N/A;   INSERT / REPLACE / REMOVE PACEMAKER  06/2007   IR RADIOLOGY PERIPHERAL GUIDED IV START  01/31/2018   IR US GUIDE VASC ACCESS LEFT  01/31/2018   JOINT REPLACEMENT     LAPAROSCOPIC CHOLECYSTECTOMY  1994   LEFT AND RIGHT HEART CATHETERIZATION WITH CORONARY ANGIOGRAM N/A 10/06/2013   Procedure: LEFT AND RIGHT HEART CATHETERIZATION WITH CORONARY ANGIOGRAM;  Surgeon: Troy Sine, MD;  Location: [facility] CATH LAB;  Service: Cardiovascular;  Laterality: N/A;   LEFT OOPHORECTOMY Left ~ [place]  2000s - 04/2015 X 3   L3-4; L4-5; L2-3; Dr Trenton Gammon   RIGHT HEART CATH N/A 01/04/2022   Procedure: RIGHT HEART CATH;  Surgeon: Larey Dresser, MD;  Location: [place] CV LAB;  Service: Cardiovascular;  Laterality: N/A;   RIGHT/LEFT HEART CATH AND CORONARY ANGIOGRAPHY N/A  05/09/2017   Procedure: RIGHT/LEFT HEART CATH AND CORONARY ANGIOGRAPHY;  Surgeon: Sherren Mocha, MD;  Location: [place] CV LAB;  Service: Cardiovascular;  Laterality: N/A;   SKIN CANCER EXCISION Right    [place]   TEE WITHOUT CARDIOVERSION N/A 09/29/2016   Procedure: TRANSESOPHAGEAL ECHOCARDIOGRAM (TEE);  Surgeon: Jerline Pain, MD;  Location: [facility] ENDOSCOPY;  Service: Cardiovascular;  Laterality: N/A;   TOTAL ABDOMINAL HYSTERECTOMY  1984   "uterus & right ovary"   TOTAL KNEE ARTHROPLASTY Left 04/23/2017   TOTAL KNEE ARTHROPLASTY Left 04/23/2017   Procedure: TOTAL KNEE ARTHROPLASTY;   Surgeon: Frederik Pear, MD;  Location: [place];  Service: Orthopedics;  Laterality: Left;   ULTRASOUND GUIDANCE FOR VASCULAR ACCESS  05/09/2017   Procedure: Ultrasound Guidance For Vascular Access;  Surgeon: Sherren Mocha, MD;  Location: [place] CV LAB;  Service: Cardiovascular;;     Current Outpatient Medications:    apixaban (ELIQUIS) 5 MG TABS tablet, Take 1 tablet (5 mg total) by mouth 2 (two) times daily., Disp: 180 tablet, Rfl: 1   atorvastatin (LIPITOR) 40 MG tablet, TAKE ONE TABLET BY MOUTH ONCE DAILY, Disp: 90 tablet, Rfl: 1   benzonatate (TESSALON PERLES) 100 MG capsule, 1-2 capsules up to twice daily as needed for cough., Disp: 30 capsule, Rfl: 0   Cholecalciferol (DIALYVITE VITAMIN D 5000) 125 MCG (5000 UT) capsule, Take 5,000 Units by mouth daily., Disp: , Rfl:    Continuous Blood Gluc Receiver ([place]) DEVI, Use as directed while on insulin, Disp: 1 each, Rfl: 3   Continuous Blood Gluc Receiver (FREESTYLE LIBRE 14 DAY READER) DEVI, Use   14 days for diabetes control., Disp: 1 each, Rfl: 3   Continuous Blood Gluc Sensor (DEXCOM G7 SENSOR) MISC, Use as directed while on insulin, Disp: 1 each, Rfl: 3   diltiazem (CARDIZEM LA) 360 MG 24 hr tablet, Take 1 tablet (360 mg total) by mouth daily. KEEP UPCOMING APPOINTMENT FOR FUTURE REFILLS., Disp: 30 tablet, Rfl: 0   dofetilide (TIKOSYN) 250 MCG capsule, Take 1 capsule (250 mcg total) by mouth 2 (two) times daily. KEEP UPCOMING APPOINTMENT FOR FUTURE REFILLS., Disp: 180 capsule, Rfl: 3   DULoxetine (CYMBALTA) 60 MG capsule, Take 1 capsule (60 mg total) by mouth daily., Disp: 90 capsule, Rfl: 0   empagliflozin (JARDIANCE) 25 MG TABS tablet, Take 1 tablet (25 mg total) by mouth daily., Disp: 90 tablet, Rfl: 0   fluticasone (FLONASE) 50 MCG/ACT nasal spray, Place 1 spray into both nostrils daily as needed for allergies., Disp: , Rfl:    insulin degludec (TRESIBA) 100 UNIT/ML FlexTouch Pen, Inject 28 units into the skin daily.,  Disp: 3 mL, Rfl: 3   Lysine 1000 MG TABS, Take 1,000 mg by mouth at bedtime. , Disp: , Rfl:    metFORMIN (GLUCOPHAGE) 1000 MG tablet, TAKE 1 TABLET BY MOUTH TWICE A DAY, Disp: 180 tablet, Rfl: 1   metolazone (ZAROXOLYN) 2.5 MG tablet, TAKE 1 TABLET BY MOUTH EVERY 4  DAYS AS NEEDED FOR SWELLING  PLEASE KEEP UPCOMING APPOINTMENT FURTHER REFILLS, Disp: 25 tablet, Rfl: 2   metoprolol tartrate (LOPRESSOR) 50 MG tablet, Take 1 tablet by mouth in the morning and 2 tablets in the evening each day, Disp: 270 tablet, Rfl: 0   Multiple Vitamin (MULTIVITAMIN WITH MINERALS) TABS tablet, Take 1 tablet by mouth daily. Centrum, Disp: , Rfl:    nitroGLYCERIN (NITROSTAT) 0.4 MG SL tablet, Place 1 tablet (0.4 mg total) under the tongue every 5 (five) minutes x 3  doses as needed for chest pain. PLEASE CALL [phone] AND SCHEDULE AN OVERDUE APPT WITH DR Acie Fredrickson FOR FURTHER REFILLS TO BE GRANTED (2ND ATTEMPT), Disp: 15 tablet, Rfl: 0   NON FORMULARY, CPAP at bedtime, Disp: , Rfl:    omeprazole (PRILOSEC) 20 MG capsule, Take 20 mg by mouth 2 (two) times daily., Disp: , Rfl:    Polyethyl Glycol-Propyl Glycol (SYSTANE) 0.4-0.3 % SOLN, Place 1 drop into both eyes daily as needed (dry eyes)., Disp: , Rfl:    potassium chloride SA (KLOR-CON M) 20 MEQ tablet, Take 1 tablet (20 mEq total) by mouth daily. (Patient taking differently: Take 20 mEq by mouth at bedtime.), Disp: 30 tablet, Rfl: 0   spironolactone (ALDACTONE) 25 MG tablet, Take 0.5 tablets (12.5 mg total) by mouth daily., Disp: 45 tablet, Rfl: 3   torsemide (DEMADEX) 20 MG tablet, Take 3 tablets (60 mg total) by mouth daily., Disp: 30 tablet, Rfl: 0   valACYclovir (VALTREX) 1000 MG tablet, TAKE 2 TABLETS BY MOUTH EVERY 12 HOURS AS NEEDED, Disp: 34 tablet, Rfl: 2   loratadine (CLARITIN) 10 MG tablet, TAKE 1 TABLET BY MOUTH EVERY DAY (Patient not taking: Reported on 04/07/2022), Disp: 90 tablet, Rfl: 0   nystatin-triamcinolone (MYCOLOG II) cream, Apply 1 application  topically 2 (two) times daily as needed. (Patient not taking: Reported on 04/07/2022), Disp: 30 g, Rfl: 1  Current Facility-Administered Medications:    sodium chloride flush (NS) 0.9 % injection 3 mL, 3 mL, Intravenous, Q12H, Dick, Junius Creamer., NP  Jasmine DecemberTonette Bihari per patient if applicable:  GENERAL: alert, oriented, appears well and in no acute distress  HEENT: atraumatic, conjunttiva clear, no obvious abnormalities on inspection of external nose and ears, wearing O2 via [place]  NECK: normal movements of the head and neck  LUNGS: on inspection no signs of respiratory distress, breathing rate appears normal, no obvious gross SOB, gasping or wheezing, occasional cough  CV: no obvious cyanosis  MS: moves all visible extremities without noticeable abnormality  PSYCH/NEURO: pleasant and cooperative, no obvious depression or anxiety, speech and thought processing grossly intact  ASSESSMENT AND PLAN:  Discussed the following assessment and plan:  Cough, unspecified type  PND (post-nasal drip)  Nasal congestion  -we discussed possible serious and likely etiologies, options for evaluation and workup, limitations of telemedicine visit vs in person visit, treatment, treatment risks and precautions. Pt is agreeable to treatment via telemedicine at this moment. Query covid, vuri, allergies vs other. She plans to do covid test (has flowflex home test kit) and if positive call [place] pharmacy for antiviral. Let her know about cdc recs for serial testing. O/w nasal saline rinse and Tessalon for cough and urged her to have low thresh hold to seek prompt in person care if worsening, new symptoms arise, or if is not improving with treatment as expected per our conversation of expected course. Also advised she notify specialist which she reports they are aware.  Discussed options for follow up care. Did let this patient know that I do telemedicine on Tuesdays and Thursdays for [place] and those are the  days I am logged into the system. Advised to schedule follow up visit with PCP, [place] virtual visits or [facility] if any further questions or concerns to avoid delays in care.   I discussed the assessment and treatment plan with the patient. The patient was provided an opportunity to ask questions and all were answered. The patient agreed with the plan and demonstrated an understanding of the  instructions.     Lucretia Kern, DO

## 2022-04-11 NOTE — Telephone Encounter (Signed)
Right Heart Cath only>contrast not used.

## 2022-04-12 ENCOUNTER — Other Ambulatory Visit: Payer: Self-pay | Admitting: *Deleted

## 2022-04-12 DIAGNOSIS — J9611 Chronic respiratory failure with hypoxia: Secondary | ICD-10-CM

## 2022-04-12 NOTE — Progress Notes (Signed)
Pft   

## 2022-04-13 ENCOUNTER — Other Ambulatory Visit: Payer: Self-pay

## 2022-04-13 MED ORDER — DEXCOM G7 SENSOR MISC: 3 refills | Status: DC

## 2022-04-14 ENCOUNTER — Ambulatory Visit (INDEPENDENT_AMBULATORY_CARE_PROVIDER_SITE_OTHER): Payer: Medicare Other | Admitting: Internal Medicine

## 2022-04-14 DIAGNOSIS — J9611 Chronic respiratory failure with hypoxia: Secondary | ICD-10-CM

## 2022-04-14 LAB — PULMONARY FUNCTION TEST
DL/VA % pred: 135 %
DL/VA: 5.44 ml/min/mmHg/L
DLCO cor % pred: 72 %
DLCO cor: 14.91 ml/min/mmHg
DLCO unc % pred: 76 %
DLCO unc: 15.83 ml/min/mmHg
FEF 25-75 Post: 1.99 L/sec
FEF 25-75 Pre: 1.44 L/sec
FEF2575-%Change-Post: 38 %
FEF2575-%Pred-Post: 113 %
FEF2575-%Pred-Pre: 82 %
FEV1-%Change-Post: 6 %
FEV1-%Pred-Post: 57 %
FEV1-%Pred-Pre: 53 %
FEV1-Post: 1.33 L
FEV1-Pre: 1.25 L
FEV1FVC-%Change-Post: 4 %
FEV1FVC-%Pred-Pre: 111 %
FEV6-%Change-Post: 1 %
FEV6-%Pred-Post: 51 %
FEV6-%Pred-Pre: 50 %
FEV6-Post: 1.52 L
FEV6-Pre: 1.49 L
FEV6FVC-%Pred-Post: 105 %
FEV6FVC-%Pred-Pre: 105 %
FVC-%Change-Post: 2 %
FVC-%Pred-Post: 49 %
FVC-%Pred-Pre: 48 %
FVC-Post: 1.53 L
FVC-Pre: 1.49 L
Post FEV1/FVC ratio: 87 %
Post FEV6/FVC ratio: 100 %
Pre FEV1/FVC ratio: 83 %
Pre FEV6/FVC Ratio: 100 %
RV % pred: 111 %
RV: 2.7 L
TLC % pred: 79 %
TLC: 4.34 L

## 2022-04-14 NOTE — Progress Notes (Signed)
Full PFT performed today. °

## 2022-04-14 NOTE — Patient Instructions (Signed)
Full PFT performed today. °

## 2022-04-17 ENCOUNTER — Other Ambulatory Visit: Payer: Medicare Other

## 2022-04-17 ENCOUNTER — Telehealth: Payer: Self-pay | Admitting: Pharmacist

## 2022-04-17 NOTE — Progress Notes (Signed)
Call to patient per [facility] to ask if she would like her sensors filled and delivered to her on 04/18/22 patient reports yes that would be fine. Pharmacy advised. She also would like her Jardiance filled if it will be covered under her insurance as she had an issue filling with out of state pharmacy and is awaiting a credit to be returned to her. Advised her I would check with [facility] and let her know if it can be sent out tomorrow also or if ins is still not covering for her. Per [facility] is too soon to run on insurance cant be ran until 11/11 patient aware and advised will try again at that time.    [place] Clinical Pharmacist Assistant [phone]

## 2022-04-18 ENCOUNTER — Telehealth: Payer: Self-pay | Admitting: *Deleted

## 2022-04-18 ENCOUNTER — Ambulatory Visit (INDEPENDENT_AMBULATORY_CARE_PROVIDER_SITE_OTHER): Payer: Medicare Other

## 2022-04-18 ENCOUNTER — Ambulatory Visit (INDEPENDENT_AMBULATORY_CARE_PROVIDER_SITE_OTHER): Payer: Medicare Other | Admitting: Internal Medicine

## 2022-04-18 ENCOUNTER — Encounter: Payer: Self-pay | Admitting: Internal Medicine

## 2022-04-18 ENCOUNTER — Other Ambulatory Visit: Payer: Self-pay | Admitting: *Deleted

## 2022-04-18 VITALS — BP 110/56 | HR 53 | Temp 97.7°F | Wt 237.0 lb

## 2022-04-18 DIAGNOSIS — E1165 Type 2 diabetes mellitus with hyperglycemia: Secondary | ICD-10-CM

## 2022-04-18 DIAGNOSIS — I2510 Atherosclerotic heart disease of native coronary artery without angina pectoris: Secondary | ICD-10-CM | POA: Diagnosis not present

## 2022-04-18 DIAGNOSIS — I272 Pulmonary hypertension, unspecified: Secondary | ICD-10-CM

## 2022-04-18 DIAGNOSIS — R059 Cough, unspecified: Secondary | ICD-10-CM | POA: Diagnosis not present

## 2022-04-18 DIAGNOSIS — J849 Interstitial pulmonary disease, unspecified: Secondary | ICD-10-CM

## 2022-04-18 DIAGNOSIS — Z9861 Coronary angioplasty status: Secondary | ICD-10-CM | POA: Diagnosis not present

## 2022-04-18 DIAGNOSIS — I5032 Chronic diastolic (congestive) heart failure: Secondary | ICD-10-CM | POA: Diagnosis not present

## 2022-04-18 DIAGNOSIS — Z79899 Other long term (current) drug therapy: Secondary | ICD-10-CM

## 2022-04-18 LAB — POCT GLYCOSYLATED HEMOGLOBIN (HGB A1C): Hemoglobin A1C: 7 % — AB (ref 4.0–5.6)

## 2022-04-18 LAB — POC COVID19 BINAXNOW: SARS Coronavirus 2 Ag: NEGATIVE

## 2022-04-18 MED ORDER — EMPAGLIFLOZIN 25 MG PO TABS: 25.0000 mg | ORAL_TABLET | Freq: Every day | ORAL | 0 refills | Status: DC

## 2022-04-18 MED ORDER — PREDNISONE 20 MG PO TABS: 20.0000 mg | ORAL_TABLET | Freq: Two times a day (BID) | ORAL | 0 refills | Status: DC

## 2022-04-18 NOTE — Progress Notes (Signed)
Chief Complaint  Patient presents with   Follow-up   Diabetes   Cough    Pt reports coughing on 10/7. Productive-clear to white phlegm. With chest congestion. Had negative covid home test on October 01, 2022 on 10/17.     HPI: [name] 76 y.o. come in for Chronic disease management   and new problem  Dm had CM  had high 250 after cake other wise mostly ok no serious low except  poss monitor malfunction A1c predicted 6.9 had high after eating cake   no severe low bg . New problem  Hacking cough getting worse .  Had virtual, with dr Maudie Mercury  and given tessalon perles not helping  No fever has tested neg for  covid rapid . No fever  cough clear phelgm mucous  no hemoptysis wheeze  tachycardia  . Change in o2 sats  ROS: See pertinent positives and negatives per HPI. Coughing episode illness  for 10 days still coughing   no new desaturation on O2  "Everytime she has a cough like this ends up in Hospital"  Supposed to have right heart cath tomorrow to help determine cause of her symptoms pulmonary versus cardiac.  Discussed about the leg because of her illness. No exposures  she has had rsv vaccine  Past Medical History:  Diagnosis Date   Anticoagulant long-term use    pradaxa   Anxiety    Arthritis    "fingers, lower back" (04/23/2017    CAD (coronary artery disease) 9563,8756   post PTCA with bare-metal stenting to mid RCA in December 2004      CHF (congestive heart failure) (HCC)    Chronic atrial fibrillation ([place]) 06/2007   Tachybradycardia pacemaker   Chronic kidney disease    10% function - ?R, other kidney is compensating     CVA (cerebral vascular accident) [facility]) [id number]   denies residual on 04/23/2017   Depression    Diplopia 06/19/2008   Qualifier: Diagnosis of  By: [name] MD, [name]    Dysrhythmia    ATRIAL FIBRILATION   Edema of lower extremity    Hyperlipidemia    Hypertension    Inferior myocardial infarction ([place])    acute inferior wall mi/other medical hx    Myocardial infarction [facility]) 8416,6063   Obesity    OSA on CPAP    last test- 09-30-08   Pacemaker    Pneumonia 2012-09-30   tx. ----  [facility]   Pulmonary hypertension ([place])    moderate pulmonary hypertension by 10/2016 echo and 10/2013 cardiac cath   Shortness of breath    Skin cancer    "cut off right [place]; burned off LLE" (04/23/2017)   Sleep apnea    Spondylolisthesis    TIA (transient ischemic attack) 2006/10/01   Unspecified hemorrhoids without mention of complication 0/16/0109   Colonoscopy--Dr. Carlean Purl     Family History  Problem Relation Age of Onset   Suicidality Father        suicide death pt was 3 yrs, 09/30/1948   Arrhythmia Mother    Hypertension Mother    Diabetes Mother    Dementia Mother    Heart attack Brother    Heart disease Paternal Aunt    Prostate cancer Maternal Grandfather    Diabetes Paternal Grandfather        fathers side of the family   Colon cancer Maternal Aunt     Social History   Socioeconomic History   Marital status: Married  Spouse name: Alinna Siple   Number of children: 0   Years of education: HS   Highest education level: High school graduate  Occupational History   Occupation: retired    Comment: previously worked [employer]  Tobacco Use   Smoking status: Former    Packs/day: 1.00    Years: 5.00    Total pack years: 5.00    Types: Cigarettes    Start date: 05/11/1978    Quit date: 07/03/1982    Years since quitting: 39.8   Smokeless tobacco: Never  Vaping Use   Vaping Use: Never used  Substance and Sexual Activity   Alcohol use: Yes   Drug use: No   Sexual activity: Not Currently  Other Topics Concern   Not on file  Social History Narrative   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   Married    Originally from [place] of two, high school education   Former smoker 1979-1984 1ppd   Hunting dogs 7    Retired from [employer] 2    [place] Strain: [place]  (02/08/2022)   Overall Financial Resource Strain (CARDIA)    Difficulty of Paying Living Expenses: Not very hard  Food Insecurity: No Food Insecurity (01/12/2022)   Hunger Vital Sign    Worried About Running Out of Food in the Last Year: Never true    [place] in the Last Year: Never true  Transportation Needs: No Transportation Needs (01/12/2022)   PRAPARE - Hydrologist (Medical): No    Lack of Transportation (Non-Medical): No  Physical Activity: Inactive (01/12/2022)   Exercise Vital Sign    Days of Exercise per Week: 0 days    Minutes of Exercise per Session: 0 min  Stress: No Stress Concern Present (01/12/2022)   [place]    Feeling of Stress : Not at all  Social Connections: [place] (01/12/2022)   Social Connection and Isolation Panel [NHANES]    Frequency of Communication with Friends and Family: More than three times a week    Frequency of Social Gatherings with Friends and Family: More than three times a week    Attends Religious Services: More than 4 times per year    Active Member of [employer] or Organizations: Yes    Attends Music therapist: More than 4 times per year    Marital Status: Married    Outpatient Medications Prior to Visit  Medication Sig Dispense Refill   apixaban (ELIQUIS) 5 MG TABS tablet Take 1 tablet (5 mg total) by mouth 2 (two) times daily. 180 tablet 1   atorvastatin (LIPITOR) 40 MG tablet TAKE ONE TABLET BY MOUTH ONCE DAILY 90 tablet 1   BD PEN NEEDLE NANO 2ND GEN 32G X 4 MM MISC daily.     benzonatate (TESSALON PERLES) 100 MG capsule 1-2 capsules up to twice daily as needed for cough. 30 capsule 0   Cholecalciferol (DIALYVITE VITAMIN D 5000) 125 MCG (5000 UT) capsule Take 5,000 Units by mouth daily.     Continuous Blood Gluc Receiver (DEXCOM G7 RECEIVER) DEVI Use as directed while on insulin  1 each 3   Continuous Blood Gluc Sensor (DEXCOM G7 SENSOR) MISC Use as directed while on insulin 3 each 3   diltiazem (CARDIZEM LA) 360 MG 24 hr tablet Take  1 tablet (360 mg total) by mouth daily. KEEP UPCOMING APPOINTMENT FOR FUTURE REFILLS. 30 tablet 0   dofetilide (TIKOSYN) 250 MCG capsule Take 1 capsule (250 mcg total) by mouth 2 (two) times daily. KEEP UPCOMING APPOINTMENT FOR FUTURE REFILLS. 180 capsule 3   DULoxetine (CYMBALTA) 60 MG capsule Take 1 capsule (60 mg total) by mouth daily. 90 capsule 0   empagliflozin (JARDIANCE) 25 MG TABS tablet Take 1 tablet (25 mg total) by mouth daily. 14 tablet 0   fluticasone (FLONASE) 50 MCG/ACT nasal spray Place 1 spray into both nostrils daily as needed for allergies.     insulin degludec (TRESIBA) 100 UNIT/ML FlexTouch Pen Inject 28 units into the skin daily. 3 mL 3   Lysine 1000 MG TABS Take 1,000 mg by mouth at bedtime.      metFORMIN (GLUCOPHAGE) 1000 MG tablet TAKE 1 TABLET BY MOUTH TWICE A DAY 180 tablet 1   metolazone (ZAROXOLYN) 2.5 MG tablet TAKE 1 TABLET BY MOUTH EVERY 4  DAYS AS NEEDED FOR SWELLING  PLEASE KEEP UPCOMING APPOINTMENT FURTHER REFILLS 25 tablet 2   metoprolol tartrate (LOPRESSOR) 50 MG tablet Take 1 tablet by mouth in the morning and 2 tablets in the evening each day 270 tablet 0   Multiple Vitamin (MULTIVITAMIN WITH MINERALS) TABS tablet Take 1 tablet by mouth daily. Centrum     nitroGLYCERIN (NITROSTAT) 0.4 MG SL tablet Place 1 tablet (0.4 mg total) under the tongue every 5 (five) minutes x 3 doses as needed for chest pain. PLEASE CALL [phone] AND SCHEDULE AN OVERDUE APPT WITH DR Acie Fredrickson FOR FURTHER REFILLS TO BE GRANTED (2ND ATTEMPT) 15 tablet 0   NON FORMULARY CPAP at bedtime     nystatin-triamcinolone (MYCOLOG II) cream Apply 1 application topically 2 (two) times daily as needed. 30 g 1   omeprazole (PRILOSEC) 20 MG capsule Take 20 mg by mouth 2 (two) times daily.     Polyethyl Glycol-Propyl Glycol (SYSTANE) 0.4-0.3 %  SOLN Place 1 drop into both eyes daily as needed (dry eyes).     potassium chloride SA (KLOR-CON M) 20 MEQ tablet Take 1 tablet (20 mEq total) by mouth daily. (Patient taking differently: Take 20 mEq by mouth at bedtime.) 30 tablet 0   spironolactone (ALDACTONE) 25 MG tablet Take 0.5 tablets (12.5 mg total) by mouth daily. 45 tablet 3   torsemide (DEMADEX) 20 MG tablet Take 3 tablets (60 mg total) by mouth daily. 30 tablet 0   valACYclovir (VALTREX) 1000 MG tablet TAKE 2 TABLETS BY MOUTH EVERY 12 HOURS AS NEEDED 34 tablet 2   loratadine (CLARITIN) 10 MG tablet TAKE 1 TABLET BY MOUTH EVERY DAY (Patient not taking: Reported on 04/07/2022) 90 tablet 0   Continuous Blood Gluc Receiver (FREESTYLE LIBRE 14 DAY READER) DEVI Use   14 days for diabetes control. (Patient not taking: Reported on 04/18/2022) 1 each 3   Facility-Administered Medications Prior to Visit  Medication Dose Route Frequency Provider Last Rate Last Admin   sodium chloride flush (NS) 0.9 % injection 3 mL  3 mL Intravenous Q12H Marylu Lund., NP         EXAM:  BP (!) 110/56 (BP Location: Left Arm, Patient Position: Sitting, Cuff Size: Large)   Pulse (!) 53   Temp 97.7 F (36.5 C) (Oral)   Wt 237 lb (107.5 kg)   SpO2 93%   BMI 37.68 kg/m   Body mass index is 37.68 kg/m.  GENERAL: vitals reviewed and listed above,  alert, oriented, appears well hydrated and in no acute distress  on o2  coughing   mild  dyspnea and bronchitis type cough  color good  on 3 liters of O2  HEENT: atraumatic, conjunctiva  clear, no obvious abnormalities on inspection of external nose and ears masked  NECK: no obvious masses on inspection palpation  LUNGS: dec bs but no rales  ocass musical  sounds ?  no rales  CV: HRRR, no clubbing cyanosis no inc pedal edema nl cap refill  MS: moves all extremities without noticeable focal  abnormality PSYCH: pleasant and cooperative, no obvious depression or anxiety Lab Results  Component Value Date   WBC  8.5 04/07/2022   HGB 15.6 04/07/2022   HCT 46.0 04/07/2022   PLT 283 04/07/2022   GLUCOSE 130 (H) 04/07/2022   CHOL 147 08/23/2021   TRIG 206.0 (H) 08/23/2021   HDL 47.20 08/23/2021   LDLDIRECT 76.0 08/23/2021   LDLCALC 68 04/10/2019   ALT 46 (H) 12/26/2021   AST 36 12/26/2021   NA 138 04/07/2022   K 4.4 04/07/2022   CL 94 (L) 04/07/2022   CREATININE 1.46 (H) 04/07/2022   BUN 26 04/07/2022   CO2 25 04/07/2022   TSH 4.61 08/23/2021   INR 1.3 (H) 02/17/2020   HGBA1C 7.0 (A) 04/18/2022   MICROALBUR <0.7 01/23/2017   BP Readings from Last 3 Encounters:  04/18/22 (!) 110/56  04/07/22 124/72  04/04/22 118/72  Reviewed  CGM to scan   ASSESSMENT AND PLAN:  Discussed the following assessment and plan:  Cough, unspecified type - Plan: POC COVID-19, DG Chest 2 View  Type 2 diabetes mellitus with hyperglycemia, without long-term current use of insulin ([place]) - Plan: POC HgB A1c, DG Chest 2 View  Chronic diastolic heart failure ([place]) - Plan: DG Chest 2 View  Pulmonary hypertension ([place]) - Plan: DG Chest 2 View  Medication management  CAD S/P percutaneous coronary angioplasty  ILD (interstitial lung disease) ([place]) Diabetes reasonable control samples farxiga   today  cost issues . Coughing  illness since Oct 7   resp infection vs  Card cause   high risk  .  Can add mucinex and will get input Dr Chase Caller :  plan  short course prednisone  5 days . Seek care for alarm sx  Time record review glucose data new problem exacerbation evaluation follow-up 40 minutes -Patient advised to return or notify health care team  if  new concerns arise.  Patient Instructions  X ray is ok today  Will send info to Dr.  Marylene Land about advice . To help   ? Add steroids  or antibiotic .  Also to  send to dr Claiborne Billings  Add mucinex  for now  .  Also .   Diabetes ok for now  ROV in 3 months .     [name]. Kymia Simi M.D.

## 2022-04-18 NOTE — Patient Instructions (Addendum)
X ray is ok today  Will send info to Dr.  Marylene Land about advice . To help   ? Add steroids  or antibiotic .  Also to  send to dr Claiborne Billings  Add mucinex  for now  .  Also .   Diabetes ok for now  ROV in 3 months .

## 2022-04-18 NOTE — Telephone Encounter (Signed)
Call placed to patient to review 04/19/22 Right Heart Cath instructions. Patient reports she has had cough since 04/08/22, afebrile, home test negative for COVID shortly after cough started. Patient reports was told CXR at PCP today looked okay, but with cough, recommend reschedule Right Heart Cath to later date. Patient aware Right Heart Cath has been rescheduled with Dr Claiborne Billings to 05/02/22-reviewed new procedure instructions and will send via MyChart

## 2022-04-21 DIAGNOSIS — G4710 Hypersomnia, unspecified: Secondary | ICD-10-CM | POA: Diagnosis not present

## 2022-04-21 DIAGNOSIS — G4733 Obstructive sleep apnea (adult) (pediatric): Secondary | ICD-10-CM | POA: Diagnosis not present

## 2022-04-21 DIAGNOSIS — I272 Pulmonary hypertension, unspecified: Secondary | ICD-10-CM | POA: Diagnosis not present

## 2022-04-21 DIAGNOSIS — I10 Essential (primary) hypertension: Secondary | ICD-10-CM | POA: Diagnosis not present

## 2022-04-27 NOTE — Telephone Encounter (Signed)
Call placed to patient to see if cough has improved and review instructions for Right Heart Cath 05/02/22. Patient reports she still has cough, no improvement, remains afebrile. Patient states she recently completed a short course of Prednisone with no improvement in cough, started OTC cough medication on her own today to see if it will help with cough.  Patient advised I will forward to Ambrose Pancoast, NP for recommendation about proceeding with Right Heart Cath scheduled 05/02/22.

## 2022-04-28 NOTE — Telephone Encounter (Signed)
Spoke to patient. Offer ov today with Dr. Elease Hashimoto. Patient declined. Patient wants to see with Dr. [name]. Scheduled an appointment on 05/01/22 at 10:30am.

## 2022-04-30 NOTE — Progress Notes (Unsigned)
No chief complaint on file.   HPI: [name] 76 y.o. come in for  ROS: See pertinent positives and negatives per HPI.  Past Medical History:  Diagnosis Date   Anticoagulant long-term use    pradaxa   Anxiety    Arthritis    "fingers, lower back" (04/23/2017    CAD (coronary artery disease) 5643,3295   post PTCA with bare-metal stenting to mid RCA in December 2004      CHF (congestive heart failure) (HCC)    Chronic atrial fibrillation ([place]) 06/2007   Tachybradycardia pacemaker   Chronic kidney disease    10% function - ?R, other kidney is compensating     CVA (cerebral vascular accident) [facility]) [id number]   denies residual on 04/23/2017   Depression    Diplopia 06/19/2008   Qualifier: Diagnosis of  By: [name] MD, [name]    Dysrhythmia    ATRIAL FIBRILATION   Edema of lower extremity    Hyperlipidemia    Hypertension    Inferior myocardial infarction ([place])    acute inferior wall mi/other medical hx   Myocardial infarction ([place]) 6301,6010   Obesity    OSA on CPAP    last test- 2010   Pacemaker    Pneumonia 2014   tx. ----  [facility]   Pulmonary hypertension ([place])    moderate pulmonary hypertension by 10/2016 echo and 10/2013 cardiac cath   Shortness of breath    Skin cancer    "cut off right [place]; burned off LLE" (04/23/2017)   Sleep apnea    Spondylolisthesis    TIA (transient ischemic attack) 2008   Unspecified hemorrhoids without mention of complication 9/32/3557   Colonoscopy--Dr. Carlean Purl     Family History  Problem Relation Age of Onset   Suicidality Father        suicide death pt was 3 yrs, 64   Arrhythmia Mother    Hypertension Mother    Diabetes Mother    Dementia Mother    Heart attack Brother    Heart disease Paternal Aunt    Prostate cancer Maternal Grandfather    Diabetes Paternal Grandfather        fathers side of the family   Colon cancer Maternal Aunt     Social History   Socioeconomic History   Marital status: Married     Spouse name: Diplomatic Services operational officer   Number of children: 0   Years of education: HS   Highest education level: High school graduate  Occupational History   Occupation: retired    Comment: previously worked [employer]  Tobacco Use   Smoking status: Former    Packs/day: 1.00    Years: 5.00    Total pack years: 5.00    Types: Cigarettes    Start date: 05/11/1978    Quit date: 07/03/1982    Years since quitting: 39.8   Smokeless tobacco: Never  Vaping Use   Vaping Use: Never used  Substance and Sexual Activity   Alcohol use: Yes   Drug use: No   Sexual activity: Not Currently  Other Topics Concern   Not on file  Social History Narrative   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   Married    Originally from [place] of two, high school education   Former smoker [phone] 1ppd   Hunting dogs 7    Retired from [place] 2    Social Determinants  of Health   Financial Resource Strain: Low Risk  (02/08/2022)   Overall Financial Resource Strain (CARDIA)    Difficulty of Paying Living Expenses: Not very hard  Food Insecurity: No Food Insecurity (01/12/2022)   Hunger Vital Sign    Worried About Running Out of Food in the Last Year: Never true    Ran Out of Food in the Last Year: Never true  Transportation Needs: No Transportation Needs (01/12/2022)   PRAPARE - Hydrologist (Medical): No    Lack of Transportation (Non-Medical): No  Physical Activity: Inactive (01/12/2022)   Exercise Vital Sign    Days of Exercise per Week: 0 days    Minutes of Exercise per Session: 0 min  Stress: No Stress Concern Present (01/12/2022)   [place]    Feeling of Stress : Not at all  Social Connections: [place] (01/12/2022)   Social Connection and Isolation Panel [NHANES]    Frequency of Communication with Friends and Family: More than three  times a week    Frequency of Social Gatherings with Friends and Family: More than three times a week    Attends Religious Services: More than 4 times per year    Active Member of [employer] or Organizations: Yes    Attends Music therapist: More than 4 times per year    Marital Status: Married    Outpatient Medications Prior to Visit  Medication Sig Dispense Refill   apixaban (ELIQUIS) 5 MG TABS tablet Take 1 tablet (5 mg total) by mouth 2 (two) times daily. 180 tablet 1   atorvastatin (LIPITOR) 40 MG tablet TAKE ONE TABLET BY MOUTH ONCE DAILY 90 tablet 1   BD PEN NEEDLE NANO 2ND GEN 32G X 4 MM MISC daily.     benzonatate (TESSALON PERLES) 100 MG capsule 1-2 capsules up to twice daily as needed for cough. 30 capsule 0   Cholecalciferol (DIALYVITE VITAMIN D 5000) 125 MCG (5000 UT) capsule Take 5,000 Units by mouth daily.     Continuous Blood Gluc Receiver (DEXCOM G7 RECEIVER) DEVI Use as directed while on insulin 1 each 3   Continuous Blood Gluc Sensor (DEXCOM G7 SENSOR) MISC Use as directed while on insulin 3 each 3   diltiazem (CARDIZEM LA) 360 MG 24 hr tablet Take 1 tablet (360 mg total) by mouth daily. KEEP UPCOMING APPOINTMENT FOR FUTURE REFILLS. 30 tablet 0   dofetilide (TIKOSYN) 250 MCG capsule Take 1 capsule (250 mcg total) by mouth 2 (two) times daily. KEEP UPCOMING APPOINTMENT FOR FUTURE REFILLS. 180 capsule 3   DULoxetine (CYMBALTA) 60 MG capsule Take 1 capsule (60 mg total) by mouth daily. 90 capsule 0   empagliflozin (JARDIANCE) 25 MG TABS tablet Take 1 tablet (25 mg total) by mouth daily. 14 tablet 0   fluticasone (FLONASE) 50 MCG/ACT nasal spray Place 1 spray into both nostrils daily as needed for allergies.     insulin degludec (TRESIBA) 100 UNIT/ML FlexTouch Pen Inject 28 units into the skin daily. 3 mL 3   loratadine (CLARITIN) 10 MG tablet TAKE 1 TABLET BY MOUTH EVERY DAY (Patient not taking: Reported on 04/07/2022) 90 tablet 0   Lysine 1000 MG TABS Take 1,000 mg by  mouth at bedtime.      metFORMIN (GLUCOPHAGE) 1000 MG tablet TAKE 1 TABLET BY MOUTH TWICE A DAY 180 tablet 1   metolazone (ZAROXOLYN) 2.5 MG tablet TAKE 1 TABLET BY MOUTH EVERY 4  DAYS AS NEEDED FOR SWELLING  PLEASE KEEP UPCOMING APPOINTMENT FURTHER REFILLS 25 tablet 2   metoprolol tartrate (LOPRESSOR) 50 MG tablet Take 1 tablet by mouth in the morning and 2 tablets in the evening each day 270 tablet 0   Multiple Vitamin (MULTIVITAMIN WITH MINERALS) TABS tablet Take 1 tablet by mouth daily. Centrum     nitroGLYCERIN (NITROSTAT) 0.4 MG SL tablet Place 1 tablet (0.4 mg total) under the tongue every 5 (five) minutes x 3 doses as needed for chest pain. PLEASE CALL [phone] AND SCHEDULE AN OVERDUE APPT WITH DR Acie Fredrickson FOR FURTHER REFILLS TO BE GRANTED (2ND ATTEMPT) 15 tablet 0   NON FORMULARY CPAP at bedtime     nystatin-triamcinolone (MYCOLOG II) cream Apply 1 application topically 2 (two) times daily as needed. 30 g 1   omeprazole (PRILOSEC) 20 MG capsule Take 20 mg by mouth 2 (two) times daily.     Polyethyl Glycol-Propyl Glycol (SYSTANE) 0.4-0.3 % SOLN Place 1 drop into both eyes daily as needed (dry eyes).     potassium chloride SA (KLOR-CON M) 20 MEQ tablet Take 1 tablet (20 mEq total) by mouth daily. (Patient taking differently: Take 20 mEq by mouth at bedtime.) 30 tablet 0   predniSONE (DELTASONE) 20 MG tablet Take 1 tablet (20 mg total) by mouth 2 (two) times daily with a meal. May raise your blood sugar temporarily 10 tablet 0   spironolactone (ALDACTONE) 25 MG tablet Take 0.5 tablets (12.5 mg total) by mouth daily. 45 tablet 3   torsemide (DEMADEX) 20 MG tablet Take 3 tablets (60 mg total) by mouth daily. 30 tablet 0   valACYclovir (VALTREX) 1000 MG tablet TAKE 2 TABLETS BY MOUTH EVERY 12 HOURS AS NEEDED 34 tablet 2   Facility-Administered Medications Prior to Visit  Medication Dose Route Frequency Provider Last Rate Last Admin   sodium chloride flush (NS) 0.9 % injection 3 mL  3 mL  Intravenous Q12H Marylu Lund., NP         EXAM:  There were no vitals taken for this visit.  There is no height or weight on file to calculate BMI.  GENERAL: vitals reviewed and listed above, alert, oriented, appears well hydrated and in no acute distress HEENT: atraumatic, conjunctiva  clear, no obvious abnormalities on inspection of external nose and ears OP : no lesion edema or exudate  NECK: no obvious masses on inspection palpation  LUNGS: clear to auscultation bilaterally, no wheezes, rales or rhonchi, good air movement CV: HRRR, no clubbing cyanosis or  peripheral edema nl cap refill  MS: moves all extremities without noticeable focal  abnormality PSYCH: pleasant and cooperative, no obvious depression or anxiety Lab Results  Component Value Date   WBC 8.5 04/07/2022   HGB 15.6 04/07/2022   HCT 46.0 04/07/2022   PLT 283 04/07/2022   GLUCOSE 130 (H) 04/07/2022   CHOL 147 08/23/2021   TRIG 206.0 (H) 08/23/2021   HDL 47.20 08/23/2021   LDLDIRECT 76.0 08/23/2021   LDLCALC 68 04/10/2019   ALT 46 (H) 12/26/2021   AST 36 12/26/2021   NA 138 04/07/2022   K 4.4 04/07/2022   CL 94 (L) 04/07/2022   CREATININE 1.46 (H) 04/07/2022   BUN 26 04/07/2022   CO2 25 04/07/2022   TSH 4.61 08/23/2021   INR 1.3 (H) 02/17/2020   HGBA1C 7.0 (A) 04/18/2022   MICROALBUR <0.7 01/23/2017   BP Readings from Last 3 Encounters:  04/18/22 (!) 110/56  04/07/22 124/72  04/04/22 118/72    ASSESSMENT AND PLAN:  Discussed the following assessment and plan:  No diagnosis found.  -Patient advised to return or notify health care team  if  new concerns arise.  There are no Patient Instructions on file for this visit.   [name]. Navi Erber M.D.

## 2022-05-01 ENCOUNTER — Encounter: Payer: Self-pay | Admitting: Internal Medicine

## 2022-05-01 ENCOUNTER — Telehealth: Payer: Self-pay | Admitting: *Deleted

## 2022-05-01 ENCOUNTER — Ambulatory Visit (INDEPENDENT_AMBULATORY_CARE_PROVIDER_SITE_OTHER): Payer: Medicare Other | Admitting: Internal Medicine

## 2022-05-01 VITALS — BP 92/58 | HR 65 | Temp 97.9°F | Wt 234.6 lb

## 2022-05-01 DIAGNOSIS — R0981 Nasal congestion: Secondary | ICD-10-CM | POA: Diagnosis not present

## 2022-05-01 DIAGNOSIS — J849 Interstitial pulmonary disease, unspecified: Secondary | ICD-10-CM | POA: Diagnosis not present

## 2022-05-01 DIAGNOSIS — J40 Bronchitis, not specified as acute or chronic: Secondary | ICD-10-CM

## 2022-05-01 DIAGNOSIS — R059 Cough, unspecified: Secondary | ICD-10-CM

## 2022-05-01 LAB — POC COVID19 BINAXNOW: SARS Coronavirus 2 Ag: NEGATIVE

## 2022-05-01 LAB — POCT INFLUENZA A/B
Influenza A, POC: NEGATIVE
Influenza B, POC: NEGATIVE

## 2022-05-01 MED ORDER — ALBUTEROL SULFATE HFA 108 (90 BASE) MCG/ACT IN AERS: 2.0000 | INHALATION_SPRAY | RESPIRATORY_TRACT | 1 refills | Status: DC | PRN

## 2022-05-01 MED ORDER — METHYLPREDNISOLONE ACETATE 40 MG/ML IJ SUSP
40.0000 mg | Freq: Once | INTRAMUSCULAR | Status: AC
  Administered 2022-05-01: 40 mg via INTRAMUSCULAR

## 2022-05-01 MED ORDER — METHYLPREDNISOLONE ACETATE 80 MG/ML IJ SUSP
80.0000 mg | Freq: Once | INTRAMUSCULAR | Status: AC
  Administered 2022-05-01: 80 mg via INTRAMUSCULAR

## 2022-05-01 MED ORDER — IPRATROPIUM-ALBUTEROL 0.5-2.5 (3) MG/3ML IN SOLN
3.0000 mL | Freq: Once | RESPIRATORY_TRACT | Status: AC
  Administered 2022-05-01: 3 mL via RESPIRATORY_TRACT

## 2022-05-01 NOTE — Telephone Encounter (Signed)
Right Heart Cath scheduled at [facility] for: Tuesday May 02, 2022 9 AM Arrival time and place: [name] Main Entrance A at: 7 AM  Nothing to eat after midnight prior to procedure, clear liquids until 5 AM day of procedure.  Medication instructions: -Hold:  Eliquis-none 04/30/22 until post procedure  Insulin/Metformin/Jardiance AM of procedure  Torsemide/Spironolactone/Metolazone/KCl-AM of procedure -Except hold medications usual morning medications can be taken with sips of water.  Confirmed patient has responsible adult to drive home post procedure and be with patient first 24 hours after arriving home.  Patient reports no new symptoms concerning for COVID-19 in the past 10 days.  Reviewed procedure instructions with patient.

## 2022-05-01 NOTE — Telephone Encounter (Signed)
Right Heart Cath scheduled 05/02/22. Due to cough (pt reports started around 04/08/22), Right Heart Cath has previously been rescheduled from 04/11/22 and 04/19/22. Patient had Video Visit 04/11/22 with provider regarding cough, then saw PCP 04/18/22, had CXR 04/18/22 " Lungs are clear without infiltrate or effusion." Patient saw PCP in follow up today and reports she tested negative for flu and COVID today, did not feel any signs of infection today, was given Prednisone shot and albuterol nebulizer-pt reports she feels this may have helped cough. Patient reports she was prescribed albuterol inhaler today and and knows she can take with her tomorrow to use prn. Patient reports she has been afebrile.   Patient will plan to proceed with Right Heart Cath 05/02/22.  I will forward to Dr Johney Frame for her review and any further recommendations.

## 2022-05-01 NOTE — Patient Instructions (Signed)
Wheezing seems better  after  albuterol nebulizer  Injection of  steroid  today and hopefully will resolve soon.  Can use  albuterol inhaler sent to pharmacy every 4-6 hours for wheezing over the next 3-5 days as needed. ( If needed more then need medica assessment)   I will share info with your medical team  .  And let them decide if ok to proceed   I do not think you have infection at this time.

## 2022-05-02 ENCOUNTER — Encounter (HOSPITAL_COMMUNITY): Payer: Self-pay | Admitting: Cardiology

## 2022-05-02 ENCOUNTER — Other Ambulatory Visit: Payer: Self-pay

## 2022-05-02 ENCOUNTER — Encounter (HOSPITAL_COMMUNITY)
Admission: RE | Disposition: A | Discharge status: Home / Self Care | Payer: Self-pay | Source: Ambulatory Visit | Attending: Cardiovascular Disease

## 2022-05-02 ENCOUNTER — Ambulatory Visit (HOSPITAL_COMMUNITY)
Admission: RE | Admit: 2022-05-02 | Discharge: 2022-05-02 | Disposition: A | Discharge status: Home / Self Care | Payer: Medicare Other | Source: Ambulatory Visit | Attending: Cardiovascular Disease | Admitting: Cardiovascular Disease

## 2022-05-02 DIAGNOSIS — Z955 Presence of coronary angioplasty implant and graft: Secondary | ICD-10-CM | POA: Insufficient documentation

## 2022-05-02 DIAGNOSIS — I480 Paroxysmal atrial fibrillation: Secondary | ICD-10-CM | POA: Insufficient documentation

## 2022-05-02 DIAGNOSIS — I509 Heart failure, unspecified: Secondary | ICD-10-CM | POA: Diagnosis not present

## 2022-05-02 DIAGNOSIS — Z950 Presence of cardiac pacemaker: Secondary | ICD-10-CM | POA: Insufficient documentation

## 2022-05-02 DIAGNOSIS — Z794 Long term (current) use of insulin: Secondary | ICD-10-CM | POA: Diagnosis not present

## 2022-05-02 DIAGNOSIS — Z6837 Body mass index (BMI) 37.0-37.9, adult: Secondary | ICD-10-CM | POA: Insufficient documentation

## 2022-05-02 DIAGNOSIS — I5032 Chronic diastolic (congestive) heart failure: Secondary | ICD-10-CM | POA: Diagnosis not present

## 2022-05-02 DIAGNOSIS — Z7901 Long term (current) use of anticoagulants: Secondary | ICD-10-CM | POA: Insufficient documentation

## 2022-05-02 DIAGNOSIS — N1832 Chronic kidney disease, stage 3b: Secondary | ICD-10-CM | POA: Diagnosis not present

## 2022-05-02 DIAGNOSIS — Z7984 Long term (current) use of oral hypoglycemic drugs: Secondary | ICD-10-CM | POA: Diagnosis not present

## 2022-05-02 DIAGNOSIS — I130 Hypertensive heart and chronic kidney disease with heart failure and stage 1 through stage 4 chronic kidney disease, or unspecified chronic kidney disease: Secondary | ICD-10-CM | POA: Insufficient documentation

## 2022-05-02 DIAGNOSIS — Z79899 Other long term (current) drug therapy: Secondary | ICD-10-CM | POA: Insufficient documentation

## 2022-05-02 DIAGNOSIS — I2510 Atherosclerotic heart disease of native coronary artery without angina pectoris: Secondary | ICD-10-CM | POA: Diagnosis not present

## 2022-05-02 DIAGNOSIS — I495 Sick sinus syndrome: Secondary | ICD-10-CM | POA: Diagnosis not present

## 2022-05-02 DIAGNOSIS — I272 Pulmonary hypertension, unspecified: Secondary | ICD-10-CM | POA: Insufficient documentation

## 2022-05-02 DIAGNOSIS — E669 Obesity, unspecified: Secondary | ICD-10-CM | POA: Insufficient documentation

## 2022-05-02 DIAGNOSIS — E1122 Type 2 diabetes mellitus with diabetic chronic kidney disease: Secondary | ICD-10-CM | POA: Diagnosis not present

## 2022-05-02 DIAGNOSIS — G4733 Obstructive sleep apnea (adult) (pediatric): Secondary | ICD-10-CM | POA: Insufficient documentation

## 2022-05-02 HISTORY — PX: RIGHT HEART CATH: CATH118263

## 2022-05-02 LAB — POCT I-STAT EG7
Acid-Base Excess: 2 mmol/L (ref 0.0–2.0)
Acid-Base Excess: 3 mmol/L — ABNORMAL HIGH (ref 0.0–2.0)
Bicarbonate: 27.8 mmol/L (ref 20.0–28.0)
Bicarbonate: 28.4 mmol/L — ABNORMAL HIGH (ref 20.0–28.0)
Calcium, Ion: 1.17 mmol/L (ref 1.15–1.40)
Calcium, Ion: 1.26 mmol/L (ref 1.15–1.40)
HCT: 42 % (ref 36.0–46.0)
HCT: 42 % (ref 36.0–46.0)
Hemoglobin: 14.3 g/dL (ref 12.0–15.0)
Hemoglobin: 14.3 g/dL (ref 12.0–15.0)
O2 Saturation: 74 %
O2 Saturation: 74 %
Potassium: 3.8 mmol/L (ref 3.5–5.1)
Potassium: 4 mmol/L (ref 3.5–5.1)
Sodium: 140 mmol/L (ref 135–145)
Sodium: 142 mmol/L (ref 135–145)
TCO2: 29 mmol/L (ref 22–32)
TCO2: 30 mmol/L (ref 22–32)
pCO2, Ven: 46.2 mmHg (ref 44–60)
pCO2, Ven: 47.4 mmHg (ref 44–60)
pH, Ven: 7.385 (ref 7.25–7.43)
pH, Ven: 7.388 (ref 7.25–7.43)
pO2, Ven: 40 mmHg (ref 32–45)
pO2, Ven: 40 mmHg (ref 32–45)

## 2022-05-02 LAB — GLUCOSE, CAPILLARY: Glucose-Capillary: 167 mg/dL — ABNORMAL HIGH (ref 70–99)

## 2022-05-02 SURGERY — RIGHT HEART CATH
Anesthesia: LOCAL

## 2022-05-02 MED ORDER — SODIUM CHLORIDE 0.9% FLUSH: 3.0000 mL | Freq: Two times a day (BID) | INTRAVENOUS | Status: DC

## 2022-05-02 MED ORDER — LIDOCAINE HCL (PF) 1 % IJ SOLN
INTRAMUSCULAR | Status: AC
  Filled 2022-05-02: qty 30

## 2022-05-02 MED ORDER — SODIUM CHLORIDE 0.9% FLUSH: 3.0000 mL | INTRAVENOUS | Status: DC | PRN

## 2022-05-02 MED ORDER — SODIUM CHLORIDE 0.9 % IV SOLN: 250.0000 mL | INTRAVENOUS | Status: DC | PRN

## 2022-05-02 MED ORDER — ONDANSETRON HCL 4 MG/2ML IJ SOLN: 4.0000 mg | Freq: Four times a day (QID) | INTRAMUSCULAR | Status: DC | PRN

## 2022-05-02 MED ORDER — ACETAMINOPHEN 325 MG PO TABS: 650.0000 mg | ORAL_TABLET | ORAL | Status: DC | PRN

## 2022-05-02 MED ORDER — SODIUM CHLORIDE 0.9 % IV SOLN: INTRAVENOUS | Status: DC

## 2022-05-02 MED ORDER — ASPIRIN 81 MG PO CHEW: 81.0000 mg | CHEWABLE_TABLET | ORAL | Status: DC

## 2022-05-02 MED ORDER — HYDRALAZINE HCL 20 MG/ML IJ SOLN: 10.0000 mg | INTRAMUSCULAR | Status: DC | PRN

## 2022-05-02 MED ORDER — LABETALOL HCL 5 MG/ML IV SOLN: 10.0000 mg | INTRAVENOUS | Status: DC | PRN

## 2022-05-02 MED ORDER — HEPARIN (PORCINE) IN NACL 1000-0.9 UT/500ML-% IV SOLN
INTRAVENOUS | Status: DC | PRN
  Administered 2022-05-02: 500 mL

## 2022-05-02 MED ORDER — LIDOCAINE HCL (PF) 1 % IJ SOLN
INTRAMUSCULAR | Status: DC | PRN
  Administered 2022-05-02: 2 mL

## 2022-05-02 MED ORDER — HEPARIN (PORCINE) IN NACL 1000-0.9 UT/500ML-% IV SOLN
INTRAVENOUS | Status: AC
  Filled 2022-05-02: qty 500

## 2022-05-02 SURGICAL SUPPLY — 8 items
CATH SWAN GANZ 7F STRAIGHT (CATHETERS) IMPLANT
KIT MICROPUNCTURE NIT STIFF (SHEATH) IMPLANT
PACK CARDIAC CATHETERIZATION (CUSTOM PROCEDURE TRAY) IMPLANT
SHEATH PINNACLE 4F 10CM (SHEATH) IMPLANT
SHEATH PINNACLE 5F 10CM (SHEATH) IMPLANT
SHEATH PINNACLE 7F 10CM (SHEATH) IMPLANT
SHEATH PROBE COVER 6X72 (BAG) IMPLANT
WIRE MICROINTRODUCER 60CM (WIRE) IMPLANT

## 2022-05-02 NOTE — Discharge Instructions (Signed)
CALL DR MCLEAN'S OFFICE IF ANY PROBLEM, QUESTIONS OR CONCERNS; CALL IF ANY SIGNS OF INFECTION RIGHT ARM SITE

## 2022-05-02 NOTE — Interval H&P Note (Signed)
History and Physical Interval Note:  05/02/2022 9:20 AM  [name]  has presented today for surgery, with the diagnosis of pulmonary htn.  The various methods of treatment have been discussed with the patient and family. After consideration of risks, benefits and other options for treatment, the patient has consented to  Procedure(s): RIGHT HEART CATH (N/A) as a surgical intervention.  The patient's history has been reviewed, patient examined, no change in status, stable for surgery.  I have reviewed the patient's chart and labs.  Questions were answered to the patient's satisfaction.     Ronalda Walpole [employer]

## 2022-05-03 NOTE — Progress Notes (Unsigned)
Cardiology Office Note:    Date:  05/03/2022   ID:  [name], DOB 10-11-1945, MRN [id number]  PCP:  [name], MD   [place] Prov[facility] Cardiologist:  [name], MD Electrophysiologist:  Constance Haw, MD {  Referring MD: [name], MD    History of Present Illness:    [name] is a 76 y.o. female with a hx of chronic diastolic CHF, CAD s/p BMS RCA in 2004, paroxysmal atrial fibrillation s/p AF ablation X 3 and multiple cardioversions on dofetilide, tachybrady syndrome s/p PPM, OSA, obesity DM II, CKD IIIb who was previously followed by Dr. Acie Fredrickson who now presents to clinic for follow-up.  Per review of the record, the patient underwent RHC in 2018 to evaluate dyspnea which showed. RA mean 13, PA 76/29 (48), PCWP mean 33, LVEDP 21, Fick CO/CI 8.47/3.87, PVR 3 WU. Due to discrepancy in PCWP and LVEDP, CTA heart was recommended to rule out pulmonary vein stenosis given history of PV isolation. Cardiac CTA with no evidence of pulmonary vein stenosis but did show atypical left pulmonary vein drainage into left atrium.  She was hospitalized in 05/2020 for respiratory failure secondary to RSV and bronchitis. Course was complicated by Afib with RV. Tikosyn was continued and metop was added. She returned to clinic and was in [place]. She then underwent a fourth Aflutter ablation and DCCV on 07/07/20.  She was readmitted in 12/2021 for acute hypoxic respiratory failure due to acute on chronic HFpEF and possible ILD/mized WHO group II/III PH. She was diuresed with IV lasix and transitioned to PO torsemide.  Echo EF 60-65%, RV okay, no significant valvular disease. HRCT did show ground glass opacities and air trapping also noted on prior scans. Pulmonary consulted. Some findings on imaging appeared suggestive of chronic hypersensitivity pneumonitis. Outpatient follow-up with pulmonary recommended at discharge. Discharged with supplemental 2L O2.   Had repeat  RHC on 01/04/22 which showed elevated filling pressures. Torsemide was increased to '60mg'$  daily.   Was  seen in clinic by Darrick Grinder, NP on 01/16/22 where she continued to have DOE. Was on 3L [place]. Weight 239-241lbs.  Was last seen 02/24/22 where she was stable from a CV standpoint. Weight had been down to 233lbs. Tolerating medications as prescribed. [place] 05/02/22 with RAP 4, PAP 50/24 with mean 35, PCWP 16, PA sat 74%, CO 6.4, CI 2.99, PVR 2.97 WU. Recommended for continued medical therapy with torsemide and jardiance.  Today, ***  Past Medical History:  Diagnosis Date   Anticoagulant long-term use    pradaxa   Anxiety    Arthritis    "fingers, lower back" (04/23/2017    CAD (coronary artery disease) 4098,1191   post PTCA with bare-metal stenting to mid RCA in December 2004      CHF (congestive heart failure) (HCC)    Chronic atrial fibrillation ([place]) 06/2007   Tachybradycardia pacemaker   Chronic kidney disease    10% function - ?R, other kidney is compensating     CVA (cerebral vascular accident) [facility]) [phone]   denies residual on 04/23/2017   Depression    Diplopia 06/19/2008   Qualifier: Diagnosis of  By: [name] MD, [name]    Dysrhythmia    ATRIAL FIBRILATION   Edema of lower extremity    Hyperlipidemia    Hypertension    Inferior myocardial infarction [facility])    acute inferior wall mi/other medical hx   Myocardial infarction [facility]) [phone]   Obesity  OSA on CPAP    last test- 2010   Pacemaker    Pneumonia 2014   tx. ----  [facility]   Pulmonary hypertension ([place])    moderate pulmonary hypertension by 10/2016 echo and 10/2013 cardiac cath   Shortness of breath    Skin cancer    "cut off right Gruetz[place]her; burned off LLE" (04/23/2017)   Sleep apnea    Spondylolisthesis    TIA (transient ischemic attack) 2008   Unspecified hemorrhoids without mention of complication 02/13/4817   Colonoscopy--Dr. Carlean Purl     Past Surgical History:  Procedure Laterality Date    [place] N/A 09/29/2016   Procedure: Atrial Fibrillation Ablation;  Surgeon: Will Meredith Leeds, MD;  Location: [place] CV LAB;  Service: Cardiovascular;  Laterality: N/A;   ATRIAL FIBRILLATION ABLATION N/A 02/07/2018   Procedure: ATRIAL FIBRILLATION ABLATION;  Surgeon: Constance Haw, MD;  Location: [place] [facility];  Service: Cardiovascular;  Laterality: N/A;   ATRIAL FIBRILLATION ABLATION N/A 03/13/2019   Procedure: ATRIAL FIBRILLATION ABLATION;  Surgeon: Constance Haw, MD;  Location: [place] [facility] LAB;  Service: Cardiovascular;  Laterality: N/A;   ATRIAL FIBRILLATION ABLATION N/A 07/07/2020   Procedure: ATRIAL FIBRILLATION ABLATION;  Surgeon: Constance Haw, MD;  Location: [place] [facility] LAB;  Service: Cardiovascular;  Laterality: N/A;   BACK SURGERY     CARDIOVERSION N/A 09/12/2017   Procedure: CARDIOVERSION;  Surgeon: Jerline Pain, MD;  Location: [facility] ENDOSCOPY;  Service: Cardiovascular;  Laterality: N/A;   CARDIOVERSION N/A 12/13/2017   Procedure: CARDIOVERSION;  Surgeon: Sanda Klein, MD;  Location: [place] ENDOSCOPY;  Service: Cardiovascular;  Laterality: N/A;   CARDIOVERSION N/A 02/18/2018   Procedure: CARDIOVERSION;  Surgeon: Dorothy Spark, MD;  Location: [facility] ENDOSCOPY;  Service: Cardiovascular;  Laterality: N/A;   CARDIOVERSION N/A 05/20/2018   Procedure: CARDIOVERSION;  Surgeon: [name], MD;  Location: [facility] ENDOSCOPY;  Service: Cardiovascular;  Laterality: N/A;   CARDIOVERSION N/A 04/16/2019   Procedure: CARDIOVERSION;  Surgeon: Donato Heinz, MD;  Location: [place];  Service: Endoscopy;  Laterality: N/A;   CARDIOVERSION N/A 01/22/2020   Procedure: CARDIOVERSION;  Surgeon: Geralynn Rile, MD;  Location: [place];  Service: Cardiovascular;  Laterality: N/A;   CARDIOVERSION N/A 02/18/2020   Procedure: CARDIOVERSION;  Surgeon: Elouise Munroe, MD;  Location: [facility]  ENDOSCOPY;  Service: Cardiovascular;  Laterality: N/A;   CATARACT EXTRACTION W/ INTRAOCULAR LENS  IMPLANT, BILATERAL Bilateral 01/15/2017- 03/2017   CHOLECYSTECTOMY     CORONARY ANGIOPLASTY  X 2   CORONARY ANGIOPLASTY WITH [place]; ~ 2007; ?date   "1 stent; replaced stent; not sure when I got the last stent" (04/23/2017)   DOPPLER ECHOCARDIOGRAPHY  2009   ELECTROPHYSIOLOGIC STUDY N/A 03/31/2016   Procedure: Cardioversion;  Surgeon: Evans Lance, MD;  Location: [place] CV LAB;  Service: Cardiovascular;  Laterality: N/A;   ELECTROPHYSIOLOGIC STUDY N/A 08/04/2016   Procedure: Cardioversion;  Surgeon: Evans Lance, MD;  Location: [place] CV LAB;  Service: Cardiovascular;  Laterality: N/A;   INSERT / REPLACE / REMOVE PACEMAKER  06/2007   IR RADIOLOGY PERIPHERAL GUIDED IV START  01/31/2018   IR US GUIDE VASC ACCESS LEFT  01/31/2018   JOINT REPLACEMENT     LAPAROSCOPIC CHOLECYSTECTOMY  1994   LEFT AND RIGHT HEART CATHETERIZATION WITH CORONARY ANGIOGRAM N/A 10/06/2013   Procedure: LEFT AND RIGHT HEART CATHETERIZATION WITH CORONARY ANGIOGRAM;  Surgeon: Troy Sine, MD;  Location: [facility] CATH LAB;  Service: Cardiovascular;  Laterality: N/A;   LEFT OOPHORECTOMY Left ~ [place]  2000s - 04/2015 X 3   L3-4; L4-5; L2-3; Dr Trenton Gammon   RIGHT HEART CATH N/A 01/04/2022   Procedure: RIGHT HEART CATH;  Surgeon: Larey Dresser, MD;  Location: [place] CV LAB;  Service: Cardiovascular;  Laterality: N/A;   RIGHT HEART CATH N/A 05/02/2022   Procedure: RIGHT HEART CATH;  Surgeon: Larey Dresser, MD;  Location: [place] CV LAB;  Service: Cardiovascular;  Laterality: N/A;   RIGHT/LEFT HEART CATH AND CORONARY ANGIOGRAPHY N/A 05/09/2017   Procedure: RIGHT/LEFT HEART CATH AND CORONARY ANGIOGRAPHY;  Surgeon: Sherren Mocha, MD;  Location: [place] CV LAB;  Service: Cardiovascular;  Laterality: N/A;   SKIN CANCER EXCISION Right    [place]   TEE WITHOUT CARDIOVERSION N/A  09/29/2016   Procedure: TRANSESOPHAGEAL ECHOCARDIOGRAM (TEE);  Surgeon: Jerline Pain, MD;  Location: [facility] ENDOSCOPY;  Service: Cardiovascular;  Laterality: N/A;   TOTAL ABDOMINAL HYSTERECTOMY  1984   "uterus & right ovary"   TOTAL KNEE ARTHROPLASTY Left 04/23/2017   TOTAL KNEE ARTHROPLASTY Left 04/23/2017   Procedure: TOTAL KNEE ARTHROPLASTY;  Surgeon: Frederik Pear, MD;  Location: [place];  Service: Orthopedics;  Laterality: Left;   ULTRASOUND GUIDANCE FOR VASCULAR ACCESS  05/09/2017   Procedure: Ultrasound Guidance For Vascular Access;  Surgeon: Sherren Mocha, MD;  Location: [place] CV LAB;  Service: Cardiovascular;;    Current Medications: No outpatient medications have been marked as taking for the 05/04/22 encounter (Appointment) with [name], MD.   Current Facility-Administered Medications for the 05/04/22 encounter (Appointment) with [name], MD  Medication   sodium chloride flush (NS) 0.9 % injection 3 mL     Allergies:   Hydrocodone-guaifenesin and Adhesive [tape]   Social History   Socioeconomic History   Marital status: Married    Spouse name: Diplomatic Services operational officer   Number of children: 0   Years of education: HS   Highest education level: High school graduate  Occupational History   Occupation: retired    Comment: previously worked [employer]  Tobacco Use   Smoking status: Former    Packs/day: 1.00    Years: 5.00    Total pack years: 5.00    Types: Cigarettes    Start date: 05/11/1978    Quit date: 07/03/1982    Years since quitting: 39.8   Smokeless tobacco: Never  Vaping Use   Vaping Use: Never used  Substance and Sexual Activity   Alcohol use: Yes   Drug use: No   Sexual activity: Not Currently  Other Topics Concern   Not on file  Social History Narrative   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   Married    Originally from [place] of two, high school education   Former smoker 1979-1984 1ppd   Hunting dogs 7     Retired from [employer] 2    Social Determinants of Radio broadcast assistant Strain: [place]  (02/08/2022)   Overall Financial Resource Strain (CARDIA)    Difficulty of Paying Living Expenses: Not very hard  Food Insecurity: No Food Insecurity (01/12/2022)   Hunger Vital Sign    Worried About Running Out of Food in the Last Year: Never true    Ran Out of Food in the Last Year: Never  true  Transportation Needs: No Transportation Needs (01/12/2022)   PRAPARE - Hydrologist (Medical): No    Lack of Transportation (Non-Medical): No  Physical Activity: Inactive (01/12/2022)   Exercise Vital Sign    Days of Exercise per Week: 0 days    Minutes of Exercise per Session: 0 min  Stress: No Stress Concern Present (01/12/2022)   [place]    Feeling of Stress : Not at all  Social Connections: [place] (01/12/2022)   Social Connection and Isolation Panel [NHANES]    Frequency of Communication with Friends and Family: More than three times a week    Frequency of Social Gatherings with Friends and Family: More than three times a week    Attends Religious Services: More than 4 times per year    Active Member of [employer] or Organizations: Yes    Attends Music therapist: More than 4 times per year    Marital Status: Married     Family History: The patient's family history includes Arrhythmia in her mother; Colon cancer in her maternal aunt; Dementia in her mother; Diabetes in her mother and paternal grandfather; Heart attack in her brother; Heart disease in her paternal aunt; Hypertension in her mother; Prostate cancer in her maternal grandfather; Suicidality in her father.  ROS:   Please see the history of present illness.     All other systems reviewed and are negative.  EKGs/Labs/Other Studies Reviewed:    The following studies were  reviewed today: [place] 05/02/22: Right Heart Pressures RHC Procedural Findings: Hemodynamics (mmHg) RA mean 4 RV 49/7 PA 50/24, mean 35 PCWP mean 16  Oxygen saturations: PA 74% AO 95%  Cardiac Output (Fick) 6.4  Cardiac Index (Fick) 2.99 PVR 2.97 WU   RHC 01/04/22 RA mean 14 RV 68/18 PA 63/24, mean 45 PCWP mean 25 with v-waves to 35 (prominent) Oxygen saturations: PA 71% AO 92% Cardiac Output (Fick) 7.7  Cardiac Index (Fick) 3.5 PVR 2.6 WU PAPi 2.8  TTE 12/10/21: IMPRESSIONS   1. Left ventricular ejection fraction, by estimation, is 60 to 65%. The  left ventricle has normal function. The left ventricle has no regional  wall motion abnormalities. Left ventricular diastolic parameters are  indeterminate.   2. Right ventricular systolic function is normal. The right ventricular  size is normal.   3. The mitral valve is normal in structure. Trivial mitral valve  regurgitation. No evidence of mitral stenosis.   4. The aortic valve is tricuspid. Aortic valve regurgitation is not  visualized. No aortic stenosis is present.   5. The inferior vena cava is normal in size with greater than 50%  respiratory variability, suggesting right atrial pressure of 3 mmHg.   EKG:  EKG not ordered today  Recent Labs: 07/08/2021: NT-Pro BNP 523 08/23/2021: TSH 4.61 12/15/2021: Magnesium 2.7 12/26/2021: ALT 46; B Natriuretic Peptide 93.2 04/07/2022: BUN 26; Creatinine, Ser 1.46; Platelets 283 05/02/2022: Hemoglobin 14.3; Potassium 3.8; Sodium 142  Recent Lipid Panel    Component Value Date/Time   CHOL 147 08/23/2021 0935   CHOL 116 10/16/2016 0850   TRIG 206.0 (H) 08/23/2021 0935   HDL 47.20 08/23/2021 0935   HDL 42 10/16/2016 0850   CHOLHDL 3 08/23/2021 0935   VLDL 41.2 (H) 08/23/2021 0935   LDLCALC 68 04/10/2019 0801   LDLCALC 46 10/16/2016 0850   LDLDIRECT 76.0 08/23/2021 0935     Risk Assessment/Calculations:    CHA2DS2-VASc Score =  7   This indicates a 11.2% annual risk of  stroke. The patient's score is based upon: CHF History: 1 HTN History: 1 Diabetes History: 1 Stroke History: 0 Vascular Disease History: 1 Age Score: 2 Gender Score: 1        Physical Exam:    VS:  There were no vitals taken for this visit.    Wt Readings from Last 3 Encounters:  05/02/22 230 lb (104.3 kg)  05/01/22 234 lb 9.6 oz (106.4 kg)  04/18/22 237 lb (107.5 kg)     GEN:  Comfortable, on chronic O2 HEENT: Normal NECK: No JVD; No carotid bruits CARDIAC: RRR, no murmurs.  RESPIRATORY:  Faint expiratory wheezing at right lung base. Otherwise clear ABDOMEN: Soft, non-tender, non-distended MUSCULOSKELETAL:  No edema; No deformity  SKIN: Warm and dry NEUROLOGIC:  Alert and oriented x 3 PSYCHIATRIC:  Normal affect   ASSESSMENT:    No diagnosis found.  PLAN:    In order of problems listed above:  #Chronic diastolic CHF: - Last TTE 72/0947 with EF 60-65%, normal RV, unable to assess PASP - RHC 12/2021: RAP 14, PA mean 66mHg, PCWP 273mg, CI 3.55, CO 7.74, PVR 2.6 WU -RHC 05/02/22 with RAP 4, PAP 50/24 with mean 35, PCWP 16, PA sat 74%, CO 6.4, CI 2.99, PVR 2.97 WU.  - Currently compensated and euvolemic on exam with NYHA class III symptoms - Continue torsemide '60mg'$  daily and metolazone 2.'5mg'$  as needed for weight gain - Continue potassium supplementation; takes extra on days she takes metolazone - Continue jardiance 25 mg daily for underlying HF and DM - Continue metoprolol tartrate 50 BID - Continue spiro 12.5 mg daily  -Low Na diet - Monitor weights   #Pulmonary hypertension - Suspect mixed Group II/III in the setting of chronic diastolic HF, ILD, hypersensitivity pneumonitis, OSA/OHS - RHC 12/2021: RAP 14, PA mean 4554m, PCWP 50m80m CI 3.55, CO 7.74, PVR 2.6 WU - RHC 05/02/22 with RAP 4, PAP 50/24 with mean 35, PCWP 16, PA sat 74%, CO 6.4, CI 2.99, PVR 2.97 WU.  - TTE 12/2021 with normal RV - Followed by Dr. RamaChase CallerChronic respiratory failure with  hypoxia - Suspect multifactorial, combination of chronic diastolic CHF, underlying pulmonary disease, OSA and OHS - Has been on 3L O2  - Follows with pulm as above   #Coronary artery disease - S/p BMS RCA in 2004 - [facility] [place]8: patent stent RCA, CTO small acute marginal branch with collaterals, LAD and Lcx patent - No chest apin - On atorvastatin 40 daily - No aspirin with anticoagulation   #Atrial fibrillation/flutter - Histoy of Afib ablationx3 and Aflutter ablation in 2022 - Continue Tikosyn 250 mcg BID, Metoprolol tartrate 50 BID and diltiazem 360 mg daily - Continue Eliquis '5mg'$  BID - Followed by EP   #Tachybrady syndrome - S/p PPM - Follow-up with EP as scheduled   #OSA - On CPAP   #Possible liver cirrhosis - Nodularity of liver noted on CT abdomen pelvis 02/23 and HR CT 06/23 - Follow-up with PCP as scheduled           Medication Adjustments/Labs and Tests Ordered: Current medicines are reviewed at length with the patient today.  Concerns regarding medicines are outlined above.  No orders of the defined types were placed in this encounter.  No orders of the defined types were placed in this encounter.   There are no Patient Instructions on file for this visit.   Signed, HeatFreada Bergeron  05/03/2022 8:28 PM    [place]

## 2022-05-04 ENCOUNTER — Encounter: Payer: Self-pay | Admitting: Cardiology

## 2022-05-04 ENCOUNTER — Ambulatory Visit: Payer: Medicare Other | Attending: Cardiology | Admitting: Cardiology

## 2022-05-04 VITALS — BP 116/62 | HR 62 | Ht 66.5 in | Wt 233.8 lb

## 2022-05-04 DIAGNOSIS — I5032 Chronic diastolic (congestive) heart failure: Secondary | ICD-10-CM

## 2022-05-04 DIAGNOSIS — Z950 Presence of cardiac pacemaker: Secondary | ICD-10-CM

## 2022-05-04 DIAGNOSIS — G4733 Obstructive sleep apnea (adult) (pediatric): Secondary | ICD-10-CM | POA: Diagnosis not present

## 2022-05-04 DIAGNOSIS — I272 Pulmonary hypertension, unspecified: Secondary | ICD-10-CM | POA: Diagnosis not present

## 2022-05-04 DIAGNOSIS — J9611 Chronic respiratory failure with hypoxia: Secondary | ICD-10-CM

## 2022-05-04 DIAGNOSIS — I2510 Atherosclerotic heart disease of native coronary artery without angina pectoris: Secondary | ICD-10-CM

## 2022-05-04 MED ORDER — BENZONATATE 100 MG PO CAPS: 100.0000 mg | ORAL_CAPSULE | Freq: Three times a day (TID) | ORAL | 0 refills | Status: DC | PRN

## 2022-05-04 MED ORDER — NITROGLYCERIN 0.4 MG SL SUBL: 0.4000 mg | SUBLINGUAL_TABLET | SUBLINGUAL | 6 refills | Status: DC | PRN

## 2022-05-04 NOTE — Progress Notes (Signed)
Cardiology Office Note:    Date:  05/04/2022   ID:  [name], DOB 04-03-1946, MRN [id number]  PCP:  [name], MD   [facility] Prov[facility] Cardiologist:  [name], MD Electrophysiologist:  Constance Haw, MD  {  Referring MD: [name], MD    History of Present Illness:    [name] is a 76 y.o. female with a hx of chronic diastolic CHF, CAD s/p BMS RCA in 2004, paroxysmal atrial fibrillation s/p AF ablation X 3 and multiple cardioversions on dofetilide, tachybrady syndrome s/p PPM, OSA, obesity DM II, CKD IIIb who was previously followed by Dr. Acie Fredrickson who now presents to clinic for follow-up.  Per review of the record, the patient underwent RHC in 2018 to evaluate dyspnea which showed. RA mean 13, PA 76/29 (48), PCWP mean 33, LVEDP 21, Fick CO/CI 8.47/3.87, PVR 3 WU. Due to discrepancy in PCWP and LVEDP, CTA heart was recommended to rule out pulmonary vein stenosis given history of PV isolation. Cardiac CTA with no evidence of pulmonary vein stenosis but did show atypical left pulmonary vein drainage into left atrium.  She was hospitalized in 05/2020 for respiratory failure secondary to RSV and bronchitis. Course was complicated by Afib with RV. Tikosyn was continued and metop was added. She returned to clinic and was in [place]. She then underwent a fourth Aflutter ablation and DCCV on 07/07/20.  She was readmitted in 12/2021 for acute hypoxic respiratory failure due to acute on chronic HFpEF and possible ILD/mized WHO group II/III PH. She was diuresed with IV lasix and transitioned to PO torsemide.  Echo EF 60-65%, RV okay, no significant valvular disease. HRCT did show ground glass opacities and air trapping also noted on prior scans. Pulmonary consulted. Some findings on imaging appeared suggestive of chronic hypersensitivity pneumonitis. Outpatient follow-up with pulmonary recommended at discharge. Discharged with supplemental 2L O2.   Had  repeat RHC on 01/04/22 which showed elevated filling pressures. Torsemide was increased to '60mg'$  daily.   Was  seen in clinic by Darrick Grinder, NP on 01/16/22 where she continued to have DOE. Was on 3L [place]. Weight 239-241lbs.  Was last seen 02/24/22 where she was stable from a CV standpoint. Weight had been down to 233lbs. Tolerating medications as prescribed. [place] 05/02/22 with RAP 4, PAP 50/24 with mean 35, PCWP 16, PA sat 74%, CO 6.4, CI 2.99, PVR 2.97 WU. Recommended for continued medical therapy with torsemide and jardiance.  Today, she presents with a persistent cough. She reports that she has had a cough for the past 5 weeks. Symptoms have not improved with a course of steroids but may improve some with tesslon pearls. She denies any fevers or chills. SOB is at her baseline.   Otherwise, she is stable from a CV standpoint. She denies chest pain, orthopnea, PND or LE edema. She reports having lost weight and is currently at 233 lbs. She had been having trouble receiving some of her medications due to her pharmacy in [place] refilling her prescription.   She denies any chest pain. No lightheadedness, headaches, syncope, orthopnea, or PND.  Past Medical History:  Diagnosis Date   Anticoagulant long-term use    pradaxa   Anxiety    Arthritis    "fingers, lower back" (04/23/2017    CAD (coronary artery disease) 4008,6761   post PTCA with bare-metal stenting to mid RCA in December 2004      CHF (congestive heart failure) ([place])  Chronic atrial fibrillation ([place]) 06/2007   Tachybradycardia pacemaker   Chronic kidney disease    10% function - ?R, other kidney is compensating     CVA (cerebral vascular accident) [facility]) [phone]   denies residual on 04/23/2017   Depression    Diplopia 06/19/2008   Qualifier: Diagnosis of  By: [name] MD, [name]    Dysrhythmia    ATRIAL FIBRILATION   Edema of lower extremity    Hyperlipidemia    Hypertension    Inferior myocardial infarction [facility])    acute  inferior wall mi/other medical hx   Myocardial infarction [facility]) [phone]   Obesity    OSA on CPAP    last test- 2010   Pacemaker    Pneumonia 2014   tx. ----  [facility]   Pulmonary hypertension ([place])    moderate pulmonary hypertension by 10/2016 echo and 10/2013 cardiac cath   Shortness of breath    Skin cancer    "cut off right [place]; burned off LLE" (04/23/2017)   Sleep apnea    Spondylolisthesis    TIA (transient ischemic attack) 2008   Unspecified hemorrhoids without mention of complication 3/53/2992   Colonoscopy--Dr. Carlean Purl     Past Surgical History:  Procedure Laterality Date   [place] N/A 09/29/2016   Procedure: Atrial Fibrillation Ablation;  Surgeon: Will Meredith Leeds, MD;  Location: [place] CV LAB;  Service: Cardiovascular;  Laterality: N/A;   ATRIAL FIBRILLATION ABLATION N/A 02/07/2018   Procedure: ATRIAL FIBRILLATION ABLATION;  Surgeon: Constance Haw, MD;  Location: [place] CV LAB;  Service: Cardiovascular;  Laterality: N/A;   ATRIAL FIBRILLATION ABLATION N/A 03/13/2019   Procedure: ATRIAL FIBRILLATION ABLATION;  Surgeon: Constance Haw, MD;  Location: [place] CV LAB;  Service: Cardiovascular;  Laterality: N/A;   ATRIAL FIBRILLATION ABLATION N/A 07/07/2020   Procedure: ATRIAL FIBRILLATION ABLATION;  Surgeon: Constance Haw, MD;  Location: [place] CV LAB;  Service: Cardiovascular;  Laterality: N/A;   BACK SURGERY     CARDIOVERSION N/A 09/12/2017   Procedure: CARDIOVERSION;  Surgeon: Jerline Pain, MD;  Location: [facility] ENDOSCOPY;  Service: Cardiovascular;  Laterality: N/A;   CARDIOVERSION N/A 12/13/2017   Procedure: CARDIOVERSION;  Surgeon: Sanda Klein, MD;  Location: [place] ENDOSCOPY;  Service: Cardiovascular;  Laterality: N/A;   CARDIOVERSION N/A 02/18/2018   Procedure: CARDIOVERSION;  Surgeon: Dorothy Spark, MD;  Location: [facility] ENDOSCOPY;  Service:  Cardiovascular;  Laterality: N/A;   CARDIOVERSION N/A 05/20/2018   Procedure: CARDIOVERSION;  Surgeon: [name], MD;  Location: [facility] ENDOSCOPY;  Service: Cardiovascular;  Laterality: N/A;   CARDIOVERSION N/A 04/16/2019   Procedure: CARDIOVERSION;  Surgeon: Donato Heinz, MD;  Location: [place];  Service: Endoscopy;  Laterality: N/A;   CARDIOVERSION N/A 01/22/2020   Procedure: CARDIOVERSION;  Surgeon: Geralynn Rile, MD;  Location: [place];  Service: Cardiovascular;  Laterality: N/A;   CARDIOVERSION N/A 02/18/2020   Procedure: CARDIOVERSION;  Surgeon: Elouise Munroe, MD;  Location: [facility] ENDOSCOPY;  Service: Cardiovascular;  Laterality: N/A;   CATARACT EXTRACTION W/ INTRAOCULAR LENS  IMPLANT, BILATERAL Bilateral 01/15/2017- 03/2017   CHOLECYSTECTOMY     CORONARY ANGIOPLASTY  X 2   CORONARY ANGIOPLASTY WITH [place]; ~ 2007; ?date   "1 stent; replaced stent; not sure when I got the last stent" (04/23/2017)   DOPPLER ECHOCARDIOGRAPHY  2009   ELECTROPHYSIOLOGIC STUDY N/A 03/31/2016   Procedure: Cardioversion;  Surgeon: Evans Lance, MD;  Location: [place] CV LAB;  Service: Cardiovascular;  Laterality: N/A;   ELECTROPHYSIOLOGIC STUDY N/A 08/04/2016   Procedure: Cardioversion;  Surgeon: Evans Lance, MD;  Location: [place] CV LAB;  Service: Cardiovascular;  Laterality: N/A;   INSERT / REPLACE / REMOVE PACEMAKER  06/2007   IR RADIOLOGY PERIPHERAL GUIDED IV START  01/31/2018   IR US GUIDE VASC ACCESS LEFT  01/31/2018   JOINT REPLACEMENT     LAPAROSCOPIC CHOLECYSTECTOMY  1994   LEFT AND RIGHT HEART CATHETERIZATION WITH CORONARY ANGIOGRAM N/A 10/06/2013   Procedure: LEFT AND RIGHT HEART CATHETERIZATION WITH CORONARY ANGIOGRAM;  Surgeon: Troy Sine, MD;  Location: [facility] CATH LAB;  Service: Cardiovascular;  Laterality: N/A;   LEFT OOPHORECTOMY Left ~ [place]  2000s - 04/2015 X 3   L3-4; L4-5; L2-3; Dr Trenton Gammon   RIGHT HEART CATH N/A  01/04/2022   Procedure: RIGHT HEART CATH;  Surgeon: Larey Dresser, MD;  Location: [place] CV LAB;  Service: Cardiovascular;  Laterality: N/A;   RIGHT HEART CATH N/A 05/02/2022   Procedure: RIGHT HEART CATH;  Surgeon: Larey Dresser, MD;  Location: [place] CV LAB;  Service: Cardiovascular;  Laterality: N/A;   RIGHT/LEFT HEART CATH AND CORONARY ANGIOGRAPHY N/A 05/09/2017   Procedure: RIGHT/LEFT HEART CATH AND CORONARY ANGIOGRAPHY;  Surgeon: Sherren Mocha, MD;  Location: [place] CV LAB;  Service: Cardiovascular;  Laterality: N/A;   SKIN CANCER EXCISION Right    [place]   TEE WITHOUT CARDIOVERSION N/A 09/29/2016   Procedure: TRANSESOPHAGEAL ECHOCARDIOGRAM (TEE);  Surgeon: Jerline Pain, MD;  Location: [facility] ENDOSCOPY;  Service: Cardiovascular;  Laterality: N/A;   TOTAL ABDOMINAL HYSTERECTOMY  1984   "uterus & right ovary"   TOTAL KNEE ARTHROPLASTY Left 04/23/2017   TOTAL KNEE ARTHROPLASTY Left 04/23/2017   Procedure: TOTAL KNEE ARTHROPLASTY;  Surgeon: Frederik Pear, MD;  Location: [place];  Service: Orthopedics;  Laterality: Left;   ULTRASOUND GUIDANCE FOR VASCULAR ACCESS  05/09/2017   Procedure: Ultrasound Guidance For Vascular Access;  Surgeon: Sherren Mocha, MD;  Location: [place] CV LAB;  Service: Cardiovascular;;    Current Medications: Current Meds  Medication Sig   albuterol (VENTOLIN HFA) 108 (90 Base) MCG/ACT inhaler Inhale 2 puffs into the lungs every 4 (four) hours as needed for wheezing or shortness of breath.   apixaban (ELIQUIS) 5 MG TABS tablet Take 1 tablet (5 mg total) by mouth 2 (two) times daily.   atorvastatin (LIPITOR) 40 MG tablet TAKE ONE TABLET BY MOUTH ONCE DAILY   BD PEN NEEDLE NANO 2ND GEN 32G X 4 MM MISC daily.   Cholecalciferol (DIALYVITE VITAMIN D 5000) 125 MCG (5000 UT) capsule Take 5,000 Units by mouth daily.   Continuous Blood Gluc Receiver (DEXCOM G7 RECEIVER) DEVI Use as directed while on insulin   Continuous Blood Gluc Sensor (DEXCOM G7 SENSOR)  MISC Use as directed while on insulin   diltiazem (CARDIZEM LA) 360 MG 24 hr tablet Take 1 tablet (360 mg total) by mouth daily. KEEP UPCOMING APPOINTMENT FOR FUTURE REFILLS.   dofetilide (TIKOSYN) 250 MCG capsule Take 1 capsule (250 mcg total) by mouth 2 (two) times daily. KEEP UPCOMING APPOINTMENT FOR FUTURE REFILLS.   DULoxetine (CYMBALTA) 60 MG capsule Take 1 capsule (60 mg total) by mouth daily.   fluticasone (FLONASE) 50 MCG/ACT nasal spray Place 1 spray into both nostrils daily as needed for allergies.   insulin degludec (TRESIBA) 100 UNIT/ML FlexTouch Pen  Inject 28 units into the skin daily.   loratadine (CLARITIN) 10 MG tablet TAKE 1 TABLET BY MOUTH EVERY DAY   Lysine 1000 MG TABS Take 1,000 mg by mouth at bedtime.    metFORMIN (GLUCOPHAGE) 1000 MG tablet TAKE 1 TABLET BY MOUTH TWICE A DAY   metolazone (ZAROXOLYN) 2.5 MG tablet TAKE 1 TABLET BY MOUTH EVERY 4  DAYS AS NEEDED FOR SWELLING  PLEASE KEEP UPCOMING APPOINTMENT FURTHER REFILLS   metoprolol tartrate (LOPRESSOR) 50 MG tablet Take 1 tablet by mouth in the morning and 2 tablets in the evening each day   Multiple Vitamin (MULTIVITAMIN WITH MINERALS) TABS tablet Take 1 tablet by mouth daily. [place]   NON FORMULARY CPAP at bedtime   nystatin-triamcinolone (MYCOLOG II) cream Apply 1 application topically 2 (two) times daily as needed.   omeprazole (PRILOSEC) 20 MG capsule Take 20 mg by mouth 2 (two) times daily.   Polyethyl Glycol-Propyl Glycol (SYSTANE) 0.4-0.3 % SOLN Place 1 drop into both eyes daily as needed (dry eyes).   potassium chloride SA (KLOR-CON M) 20 MEQ tablet Take 1 tablet (20 mEq total) by mouth daily. (Patient taking differently: Take 20 mEq by mouth at bedtime.)   spironolactone (ALDACTONE) 25 MG tablet Take 0.5 tablets (12.5 mg total) by mouth daily.   torsemide (DEMADEX) 20 MG tablet Take 3 tablets (60 mg total) by mouth daily.   valACYclovir (VALTREX) 1000 MG tablet TAKE 2 TABLETS BY MOUTH EVERY 12 HOURS AS NEEDED    [DISCONTINUED] nitroGLYCERIN (NITROSTAT) 0.4 MG SL tablet Place 1 tablet (0.4 mg total) under the tongue every 5 (five) minutes x 3 doses as needed for chest pain. PLEASE CALL [phone] AND SCHEDULE AN OVERDUE APPT WITH DR Acie Fredrickson FOR FURTHER REFILLS TO BE GRANTED (2ND ATTEMPT)   Current Facility-Administered Medications for the 05/04/22 encounter (Office Visit) with [name], MD  Medication   sodium chloride flush (NS) 0.9 % injection 3 mL     Allergies:   Hydrocodone-guaifenesin and Adhesive [tape]   Social History   Socioeconomic History   Marital status: Married    Spouse name: Diplomatic Services operational officer   Number of children: 0   Years of education: HS   Highest education level: High school graduate  Occupational History   Occupation: retired    Comment: previously worked [employer]  Tobacco Use   Smoking status: Former    Packs/day: 1.00    Years: 5.00    Total pack years: 5.00    Types: Cigarettes    Start date: 05/11/1978    Quit date: 07/03/1982    Years since quitting: 39.8   Smokeless tobacco: Never  Vaping Use   Vaping Use: Never used  Substance and Sexual Activity   Alcohol use: Yes   Drug use: No   Sexual activity: Not Currently  Other Topics Concern   Not on file  Social History Narrative   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   Married    Originally from [place] of two, high school education   Former smoker 1979-1984 1ppd   Hunting dogs 7    Retired from [employer] 2    Social Determinants of Radio broadcast assistant Strain: [place]  (02/08/2022)   Overall Financial Resource Strain (CARDIA)    Difficulty of Paying Living Expenses: Not very hard  Food Insecurity: No Food Insecurity (01/12/2022)   Hunger Vital Sign    Worried  About Running Out of Food in the Last Year: Never true    Ran Out of Food in the Last Year: Never true  Transportation Needs: No Transportation Needs (01/12/2022)    PRAPARE - Hydrologist (Medical): No    Lack of Transportation (Non-Medical): No  Physical Activity: Inactive (01/12/2022)   Exercise Vital Sign    Days of Exercise per Week: 0 days    Minutes of Exercise per Session: 0 min  Stress: No Stress Concern Present (01/12/2022)   [place]    Feeling of Stress : Not at all  Social Connections: [place] (01/12/2022)   Social Connection and Isolation Panel [NHANES]    Frequency of Communication with Friends and Family: More than three times a week    Frequency of Social Gatherings with Friends and Family: More than three times a week    Attends Religious Services: More than 4 times per year    Active Member of [employer] or Organizations: Yes    Attends Music therapist: More than 4 times per year    Marital Status: Married     Family History: The patient's family history includes Arrhythmia in her mother; Colon cancer in her maternal aunt; Dementia in her mother; Diabetes in her mother and paternal grandfather; Heart attack in her brother; Heart disease in her paternal aunt; Hypertension in her mother; Prostate cancer in her maternal grandfather; Suicidality in her father.  ROS:   Please see the history of present illness.    Review of Systems  Constitutional:  Negative for diaphoresis, fever and weight loss.  HENT:  Negative for congestion and tinnitus.   Eyes:  Negative for double vision and discharge.  Respiratory:  Positive for cough (persisitent), shortness of breath (worse with exertion) and wheezing (expritory wheeze at the end). Negative for sputum production and stridor.   Cardiovascular:  Positive for palpitations and leg swelling. Negative for chest pain, orthopnea, claudication and PND.  Gastrointestinal:  Negative for abdominal pain, blood in stool and heartburn.  Skin:  Negative for itching and rash.  Neurological:   Negative for tremors, focal weakness and headaches.  Endo/Heme/Allergies:  Does not bruise/bleed easily.  Psychiatric/Behavioral:  Negative for substance abuse. The patient is not nervous/anxious.      All other systems reviewed and are negative.  EKGs/Labs/Other Studies Reviewed:    The following studies were reviewed today: [place] May 17, 2022: Right Heart Pressures RHC Procedural Findings: Hemodynamics (mmHg) RA mean 4 RV 49/7 PA 50/24, mean 35 PCWP mean 16  Oxygen saturations: PA 74% AO 95%  Cardiac Output (Fick) 6.4  Cardiac Index (Fick) 2.99 PVR 2.97 WU   RHC 01/04/22 RA mean 14 RV 68/18 PA 63/24, mean 45 PCWP mean 25 with v-waves to 35 (prominent) Oxygen saturations: PA 71% AO 92% Cardiac Output (Fick) 7.7  Cardiac Index (Fick) 3.5 PVR 2.6 WU PAPi 2.8  TTE 12/10/21: IMPRESSIONS   1. Left ventricular ejection fraction, by estimation, is 60 to 65%. The  left ventricle has normal function. The left ventricle has no regional  wall motion abnormalities. Left ventricular diastolic parameters are  indeterminate.   2. Right ventricular systolic function is normal. The right ventricular  size is normal.   3. The mitral valve is normal in structure. Trivial mitral valve  regurgitation. No evidence of mitral stenosis.   4. The aortic valve is tricuspid. Aortic valve regurgitation is not  visualized. No aortic stenosis is  present.   5. The inferior vena cava is normal in size with greater than 50%  respiratory variability, suggesting right atrial pressure of 3 mmHg.   EKG:  EKG is personally reviewed. 05/04/2022: EKG not ordered today 04/07/2022: ventricular paced with rate of 60 bpm    Recent Labs: 07/08/2021: NT-Pro BNP 523 08/23/2021: TSH 4.61 12/15/2021: Magnesium 2.7 12/26/2021: ALT 46; B Natriuretic Peptide 93.2 04/07/2022: BUN 26; Creatinine, Ser 1.46; Platelets 283 05/02/2022: Hemoglobin 14.3; Potassium 3.8; Sodium 142  Recent Lipid Panel    Component Value  Date/Time   CHOL 147 08/23/2021 0935   CHOL 116 10/16/2016 0850   TRIG 206.0 (H) 08/23/2021 0935   HDL 47.20 08/23/2021 0935   HDL 42 10/16/2016 0850   CHOLHDL 3 08/23/2021 0935   VLDL 41.2 (H) 08/23/2021 0935   LDLCALC 68 04/10/2019 0801   LDLCALC 46 10/16/2016 0850   LDLDIRECT 76.0 08/23/2021 0935     Risk Assessment/Calculations:    CHA2DS2-VASc Score = 7   This indicates a 11.2% annual risk of stroke. The patient's score is based upon: CHF History: 1 HTN History: 1 Diabetes History: 1 Stroke History: 0 Vascular Disease History: 1 Age Score: 2 Gender Score: 1        Physical Exam:    VS:  BP 116/62   Pulse 62   Ht 5' 6.5" (1.689 m)   Wt 233 lb 12.8 oz (106.1 kg)   SpO2 91%   BMI 37.17 kg/m     Wt Readings from Last 3 Encounters:  05/04/22 233 lb 12.8 oz (106.1 kg)  05/02/22 230 lb (104.3 kg)  05/01/22 234 lb 9.6 oz (106.4 kg)     GEN:  Comfortable, on chronic O2 HEENT: Normal NECK: No JVD; No carotid bruits CARDIAC: RRR, no murmurs.  RESPIRATORY:  End expiratory wheezing throughout ABDOMEN: Soft, non-tender, non-distended MUSCULOSKELETAL:  No edema; No deformity  SKIN: Warm and dry NEUROLOGIC:  Alert and oriented x 3 PSYCHIATRIC:  Normal affect   ASSESSMENT:    1. Chronic diastolic congestive heart failure ([place])   2. Pulmonary hypertension, unspecified ([place])   3. Pulmonary hypertension ([place])   4. OSA (obstructive sleep apnea)   5. Cardiac pacemaker in situ   6. Chronic respiratory failure with hypoxia (HCC)   7. Coronary artery disease involving native coronary artery of native heart without angina pectoris   8. Chronic diastolic heart failure (HCC)     PLAN:    In order of problems listed above:  #Chronic diastolic CHF: - Last TTE 87/6811 with EF 60-65%, normal RV, unable to assess PASP - RHC 12/2021: RAP 14, PA mean 29mHg, PCWP 236mg, CI 3.55, CO 7.74, PVR 2.6 WU - RHC 05/02/22 with RAP 4, PAP 50/24 with mean 35, PCWP 16, PA sat 74%,  CO 6.4, CI 2.99, PVR 2.97 WU.  - Currently compensated and euvolemic on exam with NYHA class III symptoms - Continue torsemide '60mg'$  daily and metolazone 2.'5mg'$  as needed for weight gain - Continue potassium supplementation; takes extra on days she takes metolazone - Continue jardiance 25 mg daily for underlying HF and DM - Continue metoprolol tartrate 50 BID - Continue spiro 12.5 mg daily  -Low Na diet - Monitor weights   #Pulmonary hypertension - Suspect mixed Group II/III in the setting of chronic diastolic HF, ILD, hypersensitivity pneumonitis, OSA/OHS - RHC 12/2021: RAP 14, PA mean 4515m, PCWP 64m109m CI 3.55, CO 7.74, PVR 2.6 WU - RHC 05/02/22 with RAP 4, PAP 50/24 with mean 35,  PCWP 16, PA sat 74%, CO 6.4, CI 2.99, PVR 2.97 WU.  - TTE 12/2021 with normal RV - Followed by Dr. Chase Caller   #Chronic respiratory failure with hypoxia - Suspect multifactorial, combination of chronic diastolic CHF, underlying pulmonary disease, OSA and OHS - Has been on 3L O2  - Follows with pulm as above   #Coronary artery disease - S/p BMS RCA in 2004 - [place] 2018: patent stent RCA, CTO small acute marginal branch with collaterals, LAD and Lcx patent - No chest apin - On atorvastatin 40 daily - No aspirin with anticoagulation   #Atrial fibrillation/flutter - Histoy of Afib ablationx3 and Aflutter ablation in 2022 - Continue Tikosyn 250 mcg BID, Metoprolol tartrate 50 BID and diltiazem 360 mg daily - Continue Eliquis '5mg'$  BID - Followed by EP   #Tachybrady syndrome - S/p PPM - Follow-up with EP as scheduled   #OSA - On CPAP   #Possible liver cirrhosis - Nodularity of liver noted on CT abdomen pelvis 02/23 and HR CT 06/23 - Follow-up with PCP as scheduled     Follow up in 3 months      Medication Adjustments/Labs and Tests Ordered: Current medicines are reviewed at length with the patient today.  Concerns regarding medicines are outlined above.  No orders of the defined types were  placed in this encounter.  Meds ordered this encounter  Medications   nitroGLYCERIN (NITROSTAT) 0.4 MG SL tablet    Sig: Place 1 tablet (0.4 mg total) under the tongue every 5 (five) minutes x 3 doses as needed for chest pain.    Dispense:  15 tablet    Refill:  6   benzonatate (TESSALON PERLES) 100 MG capsule    Sig: Take 1 capsule (100 mg total) by mouth 3 (three) times daily as needed for cough.    Dispense:  90 capsule    Refill:  0    Patient Instructions  Medication Instructions:   START TAKING TESSALON PERLES 100 MG BY MOUTH THREE TIMES DAILY AS NEEDED FOR COUGH  *If you need a refill on your cardiac medications before your next appointment, please call your pharmacy*    Follow-Up:  3 MONTHS WITH ERNEST DICK  NP IN THE OFFICE   Important Information About Sugar         I,Rachel Rivera,acting as a scribe for [name], MD.,have documented all relevant documentation on the behalf of [name], MD,as directed by  [name], MD while in the presence of [name], MD.  I, [name], MD, have reviewed all documentation for this visit. The documentation on 05/04/22 for the exam, diagnosis, procedures, and orders are all accurate and complete.   Signed, [name], MD  05/04/2022 10:06 AM    [place]

## 2022-05-04 NOTE — Patient Instructions (Addendum)
Medication Instructions:   START TAKING TESSALON PERLES 100 MG BY MOUTH THREE TIMES DAILY AS NEEDED FOR COUGH  *If you need a refill on your cardiac medications before your next appointment, please call your pharmacy*    Follow-Up:  [place]  NP IN THE OFFICE   Important Information About Sugar

## 2022-05-07 DIAGNOSIS — J849 Interstitial pulmonary disease, unspecified: Secondary | ICD-10-CM | POA: Diagnosis not present

## 2022-05-07 DIAGNOSIS — J9601 Acute respiratory failure with hypoxia: Secondary | ICD-10-CM | POA: Diagnosis not present

## 2022-05-07 DIAGNOSIS — J8410 Pulmonary fibrosis, unspecified: Secondary | ICD-10-CM | POA: Diagnosis not present

## 2022-05-07 NOTE — Progress Notes (Unsigned)
Subjective:     Patient ID: [name], female   DOB: Apr 08, 1946,    MRN: [id number]  HPI  F quit smoking in 1984 followed previously by [facility] for osa on CPAP since 2010 NPSG 2010- AHI 21/ hr Hypersensitivity Pneumonia panel 12/29/2021 positive for Aspergillus fumigatus antibodies PFT 04/14/22- Moderate restriction, Increased Diffusion =============================================================  12/06/2015-76 year old female former smoker followed for OSA, complicated by morbid obesity, pulmonary hypertension, CAD/stents/ CHF PAfib/ pacemaker, history CVA, NPSG 2010- AHI 21/ hr New machine 2016 CPAP 13/ [facility] [place] pt-seen last by MW 06-2015; DME  [facility] Pt wears CPAP every night and needs new mask.  05/09/22- 76 year old female former smoker followed for OSA, complicated by Chronic Hypoxic Resp Failure, Hypersensitivity Pneumonia, morbid Obesity,  CAD/stents/ MI/CHF, PAfib/ pacemaker, dCHF,  history CVA, WHO3 Pulmonary Hypertension, CKD3,  -albuterol hfa,  O2 3L / [facility] new hosp 05/04/22 CPAP 13/ [facility] AirSense 10 AutoSet Body weight today-232 lbs Download compliance-93%, AHI 0.7/ hr Covid vax-5 Phizer RSV vax- had Flu vax-had Hosp June- Acute Hypoxic Respiratory Failure, WHO III PH followed by Cardiology. Pulmonary consult recommended Pulm f/u> O2 2L,  She is using her rescue inhaler 3 times daily.  It seems to help some.  Reviewing chart, imaging seems to indicate ILD/ Hypersensitivity Pneumonia pattern-discussed today. Lab-Hypersensitivity Pneumonia panel 12/29/2021 positive for Aspergillus fumigatus antibodies. She lives in a two-story home without pets or basement and says it is not moldy. Sputum now described as slightly yellow with cough x6 weeks like clearing her throat and getting better.  No fever, chills, adenopathy or rash. CPAP download reviewed.  She got her current replacement machine 2 months ago and it is working well.  HRCT chest 03/14/22- IMPRESSION: 1.  Significantly improved, although persistent ground-glass airspace opacity and consolidation throughout the upper lobes consistent with improved nonspecific infection or inflammation. 2. No significant change in previously described underlying mosaic attenuation of the airspaces with apical predominant irregular interstitial opacity and septal thickening. Lobular air trapping on expiratory phase imaging. Findings remain suggestive of chronic fibrotic hypersensitivity pneumonitis and are in an alternative diagnosis (not UIP) pattern per consensus guidelines: Diagnosis of Idiopathic Pulmonary Fibrosis: An Official ATS/ERS/JRS/ALAT Clinical Practice Guideline. [place], Iss 5, [phone], Mar 03 2017. 3. Unchanged prominent mediastinal and hilar lymph nodes, likely reactive. 4. Coronary artery disease. 5. Cirrhosis. Aortic Atherosclerosis (ICD10-I70.0).   ROS-see HPI  + = positive Constitutional:    weight loss, night sweats, fevers, chills, fatigue, lassitude. HEENT:    headaches, difficulty swallowing, tooth/dental problems, sore throat,       sneezing, itching, ear ache, nasal congestion, post nasal drip, snoring CV:    chest pain, orthopnea, PND, swelling in lower extremities, anasarca,                                                    dizziness, palpitations Resp:   shortness of breath with exertion or at rest.                productive cough,   non-productive cough, coughing up of blood.              change in color of mucus.  wheezing.   Skin:    rash or lesions. GI:  No-   heartburn, indigestion, abdominal pain, nausea, vomiting, diarrhea,  change in bowel habits, loss of appetite GU: dysuria, change in color of urine, no urgency or frequency.   flank pain. MS:   joint pain, stiffness, decreased range of motion, back pain. Neuro-     nothing unusual Psych:  change in mood or affect.  depression or anxiety.   memory loss.  OBJ- Physical  Exam General- Alert, Oriented, Affect-appropriate, Distress- none acute, + obese Skin- rash-none, lesions- none, excoriation- none Lymphadenopathy- none Head- atraumatic            Eyes- Gross vision intact, PERRLA, conjunctivae and secretions clear            Ears- Hearing, canals-normal            Nose- Clear, no-Septal dev, mucus, polyps, erosion, perforation             Throat- Mallampati II , mucosa clear , drainage- none, tonsils- atrophic Neck- flexible , trachea midline, no stridor , thyroid nl, carotid no bruit Chest - symmetrical excursion , unlabored           Heart/CV- RRR , no murmur , no gallop  , no rub, nl s1 s2                           - JVD- none , edema- none, stasis changes- none, varices- none           Lung- clear to P&A, wheeze- none, cough- none , dullness-none, rub- none           Chest wall-+  pacemaker left Abd-  Br/ Gen/ Rectal- Not done, not indicated Extrem- cyanosis- none, clubbing, none, atrophy- none, strength- nl Neuro- grossly intact to observation    Assessment:

## 2022-05-09 ENCOUNTER — Ambulatory Visit: Payer: Medicare Other | Admitting: Internal Medicine

## 2022-05-09 ENCOUNTER — Encounter: Payer: Self-pay | Admitting: Internal Medicine

## 2022-05-09 VITALS — BP 120/70 | HR 64 | Ht 66.5 in | Wt 232.8 lb

## 2022-05-09 DIAGNOSIS — G4733 Obstructive sleep apnea (adult) (pediatric): Secondary | ICD-10-CM

## 2022-05-09 DIAGNOSIS — J849 Interstitial pulmonary disease, unspecified: Secondary | ICD-10-CM

## 2022-05-09 DIAGNOSIS — J9601 Acute respiratory failure with hypoxia: Secondary | ICD-10-CM

## 2022-05-09 DIAGNOSIS — J679 Hypersensitivity pneumonitis due to unspecified organic dust: Secondary | ICD-10-CM

## 2022-05-09 LAB — CBC WITH DIFFERENTIAL/PLATELET
Basophils Absolute: 0.1 10*3/uL (ref 0.0–0.1)
Basophils Relative: 0.8 % (ref 0.0–3.0)
Eosinophils Absolute: 0.8 10*3/uL — ABNORMAL HIGH (ref 0.0–0.7)
Eosinophils Relative: 7.2 % — ABNORMAL HIGH (ref 0.0–5.0)
HCT: 45.4 % (ref 36.0–46.0)
Hemoglobin: 14.8 g/dL (ref 12.0–15.0)
Lymphocytes Relative: 20.6 % (ref 12.0–46.0)
Lymphs Abs: 2.3 10*3/uL (ref 0.7–4.0)
MCHC: 32.5 g/dL (ref 30.0–36.0)
MCV: 94.5 fl (ref 78.0–100.0)
Monocytes Absolute: 1 10*3/uL (ref 0.1–1.0)
Monocytes Relative: 9.2 % (ref 3.0–12.0)
Neutro Abs: 7.1 10*3/uL (ref 1.4–7.7)
Neutrophils Relative %: 62.2 % (ref 43.0–77.0)
Platelets: 247 10*3/uL (ref 150.0–400.0)
RBC: 4.8 Mil/uL (ref 3.87–5.11)
RDW: 14.3 % (ref 11.5–15.5)
WBC: 11.4 10*3/uL — ABNORMAL HIGH (ref 4.0–10.5)

## 2022-05-09 NOTE — Patient Instructions (Addendum)
Order- Sputum culture- fungal, AF B     dx Hypersensitivity pneumonia  Order- lab- CBC w diff, IgE    dx Hypersensitivity Pneumonia

## 2022-05-10 ENCOUNTER — Encounter: Payer: Self-pay | Admitting: Internal Medicine

## 2022-05-10 ENCOUNTER — Other Ambulatory Visit: Payer: Self-pay | Admitting: Internal Medicine

## 2022-05-10 LAB — IGE: IgE (Immunoglobulin E), Serum: 302 kU/L — ABNORMAL HIGH (ref <=114)

## 2022-05-10 NOTE — Assessment & Plan Note (Signed)
Benefits from CPAP with good compliance and control Plan-continue CPAP 13

## 2022-05-10 NOTE — Assessment & Plan Note (Signed)
Benefits from oxygen and will continue for now

## 2022-05-16 ENCOUNTER — Telehealth: Payer: Self-pay | Admitting: Internal Medicine

## 2022-05-16 ENCOUNTER — Other Ambulatory Visit: Payer: Self-pay | Admitting: Internal Medicine

## 2022-05-16 NOTE — Telephone Encounter (Signed)
Contact [facility]. They receive the refill and no further action is needed.

## 2022-05-16 NOTE — Telephone Encounter (Signed)
  [facility] - [place], [place] - [address] Phone: [phone]  Fax: [phone]     Desiree Lucy called to request a new Rx for the  insulin degludec (TRESIBA) 100 UNIT/ML FlexTouch Pen   Pt needs 9 mls (32 day supply) Also, original Rx is for 3 refill Please call back for clarification.

## 2022-05-19 ENCOUNTER — Other Ambulatory Visit: Payer: Self-pay | Admitting: Internal Medicine

## 2022-05-19 ENCOUNTER — Encounter: Payer: Self-pay | Admitting: Internal Medicine

## 2022-05-19 ENCOUNTER — Other Ambulatory Visit: Payer: Self-pay

## 2022-05-19 ENCOUNTER — Telehealth: Payer: Self-pay | Admitting: Pharmacist

## 2022-05-19 DIAGNOSIS — E1165 Type 2 diabetes mellitus with hyperglycemia: Secondary | ICD-10-CM

## 2022-05-19 MED ORDER — EMPAGLIFLOZIN 25 MG PO TABS: 25.0000 mg | ORAL_TABLET | Freq: Every day | ORAL | 1 refills | Status: DC

## 2022-05-19 NOTE — Progress Notes (Signed)
Call to patient per [facility] to inform her of co pay for 30 DS supply of Jardiance that was sent in for her of 31.72. Patient reports she is ok with the cost and delivery on today and that next fill we can try for 90 DS. Informed Pharmacy and [name] so that she may request 63 DS be sent to the pharmacy.   [place] Clinical Pharmacist Assistant [phone]

## 2022-05-22 ENCOUNTER — Telehealth: Payer: Self-pay | Admitting: Pharmacist

## 2022-05-22 DIAGNOSIS — I272 Pulmonary hypertension, unspecified: Secondary | ICD-10-CM | POA: Diagnosis not present

## 2022-05-22 DIAGNOSIS — G4710 Hypersomnia, unspecified: Secondary | ICD-10-CM | POA: Diagnosis not present

## 2022-05-22 DIAGNOSIS — G4733 Obstructive sleep apnea (adult) (pediatric): Secondary | ICD-10-CM | POA: Diagnosis not present

## 2022-05-22 DIAGNOSIS — I10 Essential (primary) hypertension: Secondary | ICD-10-CM | POA: Diagnosis not present

## 2022-05-22 NOTE — Progress Notes (Signed)
Chronic Care Management Pharmacy Assistant   Name: [name]  MRN: [id number] DOB: Jul 15, 1945  Reason for Encounter: Medication Review Medication Coordination    Recent office visits:  05/01/22 [name], [name], MD - Patient presented for Wheezy bronchitis and other concerns. Administered Depo-Medrol. Prescribed Albuterol Inhaler.   04/18/22 [name], [name], MD - Patient presented for cough and other concerns. No medication changes.   04/11/22 Lucretia Kern, DO - Patient presented via video for cough unspecified and other concerns. No medication changes.   Recent consult visits:  05/09/22 Deneise Lever, MD (Pulmonology) - Patient presented for Hypersensitivity pneumonia and other concerns. No medication changes.   05/04/22 [name], MD (Cardiology) - Patient presented for Chronic diastolic congestive heart failure and other concerns. No medication changes.   04/07/22 Marylu Lund., NP - (Cardiology) - Patient presented for Chronic diastolic congestive heart failure and other concerns. No medication changes.   04/04/22 Brand Males, MD (Pulmonology) - Patient presented for Chronic respiratory failure with hypoxia and other concerns. No medication changes.    Hospital visits:  Medication Reconciliation was completed by comparing discharge summary, patient's EMR and Pharmacy list, and upon discussion with patient.  Patient presented to [facility] on 05/02/22 due to Pulmonary Hypertension and Procedure.Patient was present for 4 hours.  New?Medications Started at [facility] Discharge:?? -started  none  Medication Changes at Hospital Discharge: -Changed  none  Medications Discontinued at Hospital Discharge: -Stopped  None  Medications that remain the same after Hospital Discharge:??  -All other medications will remain the same.    Medications: Outpatient Encounter Medications as of 05/22/2022  Medication Sig   albuterol (VENTOLIN HFA)  108 (90 Base) MCG/ACT inhaler Inhale 2 puffs into the lungs every 4 (four) hours as needed for wheezing or shortness of breath.   apixaban (ELIQUIS) 5 MG TABS tablet Take 1 tablet (5 mg total) by mouth 2 (two) times daily.   atorvastatin (LIPITOR) 40 MG tablet TAKE ONE TABLET BY MOUTH ONCE DAILY   BD PEN NEEDLE NANO 2ND GEN 32G X 4 MM MISC daily.   benzonatate (TESSALON PERLES) 100 MG capsule Take 1 capsule (100 mg total) by mouth 3 (three) times daily as needed for cough.   Cholecalciferol (DIALYVITE VITAMIN D 5000) 125 MCG (5000 UT) capsule Take 5,000 Units by mouth daily.   Continuous Blood Gluc Receiver (DEXCOM G7 RECEIVER) DEVI Use as directed while on insulin   Continuous Blood Gluc Sensor (DEXCOM G7 SENSOR) MISC Use as directed while on insulin   diltiazem (CARDIZEM LA) 360 MG 24 hr tablet Take 1 tablet (360 mg total) by mouth daily. KEEP UPCOMING APPOINTMENT FOR FUTURE REFILLS.   dofetilide (TIKOSYN) 250 MCG capsule Take 1 capsule (250 mcg total) by mouth 2 (two) times daily. KEEP UPCOMING APPOINTMENT FOR FUTURE REFILLS.   DULoxetine (CYMBALTA) 60 MG capsule Take 1 capsule (60 mg total) by mouth daily.   empagliflozin (JARDIANCE) 25 MG TABS tablet Take 1 tablet (25 mg total) by mouth daily.   fluticasone (FLONASE) 50 MCG/ACT nasal spray Place 1 spray into both nostrils daily as needed for allergies.   insulin degludec (TRESIBA FLEXTOUCH) 100 UNIT/ML FlexTouch Pen Inject 28 units into THE SKIN daily   loratadine (CLARITIN) 10 MG tablet TAKE 1 TABLET BY MOUTH EVERY DAY   Lysine 1000 MG TABS Take 1,000 mg by mouth at bedtime.    metFORMIN (GLUCOPHAGE) 1000 MG tablet TAKE 1 TABLET BY MOUTH TWICE A DAY  metolazone (ZAROXOLYN) 2.5 MG tablet TAKE 1 TABLET BY MOUTH EVERY 4  DAYS AS NEEDED FOR SWELLING  PLEASE KEEP UPCOMING APPOINTMENT FURTHER REFILLS   metoprolol tartrate (LOPRESSOR) 50 MG tablet Take 1 tablet by mouth in the morning and 2 tablets in the evening each day   Multiple Vitamin  (MULTIVITAMIN WITH MINERALS) TABS tablet Take 1 tablet by mouth daily. Centrum   nitroGLYCERIN (NITROSTAT) 0.4 MG SL tablet Place 1 tablet (0.4 mg total) under the tongue every 5 (five) minutes x 3 doses as needed for chest pain.   NON FORMULARY CPAP at bedtime   nystatin-triamcinolone (MYCOLOG II) cream Apply 1 application topically 2 (two) times daily as needed.   omeprazole (PRILOSEC) 20 MG capsule Take 20 mg by mouth 2 (two) times daily.   Polyethyl Glycol-Propyl Glycol (SYSTANE) 0.4-0.3 % SOLN Place 1 drop into both eyes daily as needed (dry eyes).   potassium chloride SA (KLOR-CON M) 20 MEQ tablet Take 1 tablet (20 mEq total) by mouth daily. (Patient taking differently: Take 20 mEq by mouth at bedtime.)   predniSONE (DELTASONE) 20 MG tablet Take 1 tablet (20 mg total) by mouth 2 (two) times daily with a meal. May raise your blood sugar temporarily   spironolactone (ALDACTONE) 25 MG tablet Take 0.5 tablets (12.5 mg total) by mouth daily.   torsemide (DEMADEX) 20 MG tablet Take 3 tablets (60 mg total) by mouth daily.   valACYclovir (VALTREX) 1000 MG tablet TAKE 2 TABLETS BY MOUTH EVERY 12 HOURS AS NEEDED   Facility-Administered Encounter Medications as of 05/22/2022  Medication   sodium chloride flush (NS) 0.9 % injection 3 mL   Reviewed chart for medication changes ahead of medication coordination call.   BP Readings from Last 3 Encounters:  05/09/22 120/70  05/04/22 116/62  05/02/22 125/69    Lab Results  Component Value Date   HGBA1C 7.0 (A) 04/18/2022     Patient obtains medications through Adherence Packaging  30 Days   Patients 1st delivery of medications will include: Torsemide 20 mg: Take 3 tabs before breakfast Eliquis 5 mg: Take one tab at Breakfast and one tab at Bedtime Atorvastatin 40 mg: take one tab at Breakfast Diltiazem 360 mg: Take one tab at Breakfast Duloxetine 60 mg: Take one cap at Breakfast Jardiance 25 mg: Take one tab before Breakfast Metformin 1000  mg: Take one tab at Breakfast and one tab at [place] Potassium 20 Meq: Take one at Bedtime Omeprazole 20 mg: Take one cap before Breakfast and one at Bedtime Dofetilide 250 mg: Take one cap at Breakfast and one at Bedtime Spironolactone 25 mg: Take half tab at Breakfast Metoprolol 50 mg: Take one tab at Breakfast and 2 tabs at Bedtime Pen Needles Dexcom G7 Sensors   Confirmed delivery date of 06/05/22, advised patient that pharmacy will contact them the morning of delivery.   Care Gaps: Diabetic Urine - Overdue Foot Exam - Overdue BP- 120/70 05/09/22 AWV- 01/12/22 Lab Results  Component Value Date   HGBA1C 7.0 (A) 04/18/2022    Star Rating Drugs: Metformin (Glucophage) 500 mg - Last filled 04/10/22 56 DS  [facility]  Empagliflozin (Jardiance) 25 mg - Last filled 05/19/22 19 DS [facility] Atorvastatin (Lipitor) 40 mg - Last filled 03/09/22 86 DS [facility]    [place] Clinical Pharmacist Assistant [phone]

## 2022-05-29 ENCOUNTER — Other Ambulatory Visit: Payer: Self-pay | Admitting: Cardiovascular Disease

## 2022-05-29 ENCOUNTER — Other Ambulatory Visit: Payer: Self-pay | Admitting: Internal Medicine

## 2022-05-30 ENCOUNTER — Other Ambulatory Visit: Payer: Self-pay

## 2022-05-30 MED ORDER — TORSEMIDE 20 MG PO TABS
60.0000 mg | ORAL_TABLET | Freq: Every day | ORAL | 3 refills | Status: DC
  Filled 2023-02-26: qty 270, 90d supply, fill #0

## 2022-05-31 MED ORDER — OMEPRAZOLE 20 MG PO CPDR: 20.0000 mg | DELAYED_RELEASE_CAPSULE | Freq: Two times a day (BID) | ORAL | 2 refills | Status: DC

## 2022-05-31 NOTE — Addendum Note (Signed)
Addended byShanon Ace K on: 05/31/2022 07:11 PM   Modules accepted: Orders

## 2022-06-02 DIAGNOSIS — I5040 Unspecified combined systolic (congestive) and diastolic (congestive) heart failure: Secondary | ICD-10-CM | POA: Diagnosis not present

## 2022-06-02 DIAGNOSIS — M1712 Unilateral primary osteoarthritis, left knee: Secondary | ICD-10-CM | POA: Diagnosis not present

## 2022-06-02 DIAGNOSIS — I10 Essential (primary) hypertension: Secondary | ICD-10-CM | POA: Diagnosis not present

## 2022-06-02 DIAGNOSIS — G4733 Obstructive sleep apnea (adult) (pediatric): Secondary | ICD-10-CM | POA: Diagnosis not present

## 2022-06-02 DIAGNOSIS — I272 Pulmonary hypertension, unspecified: Secondary | ICD-10-CM | POA: Diagnosis not present

## 2022-06-06 DIAGNOSIS — J849 Interstitial pulmonary disease, unspecified: Secondary | ICD-10-CM | POA: Diagnosis not present

## 2022-06-06 DIAGNOSIS — J9601 Acute respiratory failure with hypoxia: Secondary | ICD-10-CM | POA: Diagnosis not present

## 2022-06-06 DIAGNOSIS — J8410 Pulmonary fibrosis, unspecified: Secondary | ICD-10-CM | POA: Diagnosis not present

## 2022-06-20 ENCOUNTER — Telehealth: Payer: Self-pay | Admitting: Pharmacist

## 2022-06-20 ENCOUNTER — Other Ambulatory Visit: Payer: Self-pay | Admitting: Cardiovascular Disease

## 2022-06-20 NOTE — Chronic Care Management (AMB) (Signed)
Chronic Care Management Pharmacy Assistant   Name: [name]  MRN: [id number] DOB: February 06, 1946  Reason for Encounter: Medication Review/ Medication Coordination    Recent office visits:  None  Recent consult visits:  None  Hospital visits:  Medication Reconciliation was completed by comparing discharge summary, patient's EMR and Pharmacy list, and upon discussion with patient.   Patient presented to [facility] on 05/02/22 due to Pulmonary Hypertension and Procedure.Patient was present for 4 hours.   New?Medications Started at [facility] Discharge:?? -started  none   Medication Changes at Hospital Discharge: -Changed  none   Medications Discontinued at Hospital Discharge: -Stopped  None   Medications that remain the same after Hospital Discharge:??  -All other medications will remain the same.    Medications: Outpatient Encounter Medications as of 06/20/2022  Medication Sig   albuterol (VENTOLIN HFA) 108 (90 Base) MCG/ACT inhaler Inhale 2 puffs into the lungs every 4 (four) hours as needed for wheezing or shortness of breath.   apixaban (ELIQUIS) 5 MG TABS tablet Take 1 tablet (5 mg total) by mouth 2 (two) times daily.   atorvastatin (LIPITOR) 40 MG tablet TAKE ONE TABLET BY MOUTH ONCE DAILY   BD PEN NEEDLE NANO 2ND GEN 32G X 4 MM MISC daily.   benzonatate (TESSALON PERLES) 100 MG capsule Take 1 capsule (100 mg total) by mouth 3 (three) times daily as needed for cough.   Cholecalciferol (DIALYVITE VITAMIN D 5000) 125 MCG (5000 UT) capsule Take 5,000 Units by mouth daily.   Continuous Blood Gluc Receiver (DEXCOM G7 RECEIVER) DEVI Use as directed while on insulin   Continuous Blood Gluc Sensor (DEXCOM G7 SENSOR) MISC Use as directed while on insulin   diltiazem (CARDIZEM LA) 360 MG 24 hr tablet TAKE ONE TABLET BY MOUTH ONCE DAILY   dofetilide (TIKOSYN) 250 MCG capsule Take 1 capsule (250 mcg total) by mouth 2 (two) times daily. KEEP UPCOMING APPOINTMENT  FOR FUTURE REFILLS.   DULoxetine (CYMBALTA) 60 MG capsule TAKE ONE CAPSULE BY MOUTH ONCE DAILY   empagliflozin (JARDIANCE) 25 MG TABS tablet Take 1 tablet (25 mg total) by mouth daily.   fluticasone (FLONASE) 50 MCG/ACT nasal spray Place 1 spray into both nostrils daily as needed for allergies.   insulin degludec (TRESIBA FLEXTOUCH) 100 UNIT/ML FlexTouch Pen Inject 28 units into THE SKIN daily   loratadine (CLARITIN) 10 MG tablet TAKE 1 TABLET BY MOUTH EVERY DAY   Lysine 1000 MG TABS Take 1,000 mg by mouth at bedtime.    metFORMIN (GLUCOPHAGE) 1000 MG tablet TAKE ONE TABLET BY MOUTH TWICE DAILY   metolazone (ZAROXOLYN) 2.5 MG tablet TAKE 1 TABLET BY MOUTH EVERY 4  DAYS AS NEEDED FOR SWELLING  PLEASE KEEP UPCOMING APPOINTMENT FURTHER REFILLS   metoprolol tartrate (LOPRESSOR) 50 MG tablet TAKE ONE TABLET BY MOUTH EVERY MORNING and TAKE TWO TABLETS BY MOUTH EVERY EVENING   Multiple Vitamin (MULTIVITAMIN WITH MINERALS) TABS tablet Take 1 tablet by mouth daily. Centrum   nitroGLYCERIN (NITROSTAT) 0.4 MG SL tablet Place 1 tablet (0.4 mg total) under the tongue every 5 (five) minutes x 3 doses as needed for chest pain.   NON FORMULARY CPAP at bedtime   nystatin-triamcinolone (MYCOLOG II) cream Apply 1 application topically 2 (two) times daily as needed.   omeprazole (PRILOSEC) 20 MG capsule Take 1 capsule (20 mg total) by mouth 2 (two) times daily.   Polyethyl Glycol-Propyl Glycol (SYSTANE) 0.4-0.3 % SOLN Place 1 drop into both eyes daily  as needed (dry eyes).   potassium chloride SA (KLOR-CON M) 20 MEQ tablet Take 1 tablet (20 mEq total) by mouth daily. (Patient taking differently: Take 20 mEq by mouth at bedtime.)   predniSONE (DELTASONE) 20 MG tablet Take 1 tablet (20 mg total) by mouth 2 (two) times daily with a meal. May raise your blood sugar temporarily   spironolactone (ALDACTONE) 25 MG tablet Take 0.5 tablets (12.5 mg total) by mouth daily.   torsemide (DEMADEX) 20 MG tablet Take 3 tablets (60  mg total) by mouth daily.   valACYclovir (VALTREX) 1000 MG tablet TAKE 2 TABLETS BY MOUTH EVERY 12 HOURS AS NEEDED   Facility-Administered Encounter Medications as of 06/20/2022  Medication   sodium chloride flush (NS) 0.9 % injection 3 mL   Reviewed chart for medication changes ahead of medication coordination call.   BP Readings from Last 3 Encounters:  05/09/22 120/70  05/04/22 116/62  05/02/22 125/69    Lab Results  Component Value Date   HGBA1C 7.0 (A) 04/18/2022     Patient obtains medications through Adherence Packaging  30 Days   Last adherence delivery included: Torsemide 20 mg: Take 3 tabs before breakfast Eliquis 5 mg: Take one tab at Breakfast and one tab at Bedtime Atorvastatin 40 mg: take one tab at Breakfast Diltiazem 360 mg: Take one tab at Breakfast Duloxetine 60 mg: Take one cap at Breakfast Jardiance 25 mg: Take one tab before Breakfast Metformin 1000 mg: Take one tab at Breakfast and one tab at [place] Potassium 20 Meq: Take one at Bedtime Omeprazole 20 mg: Take one cap before Breakfast and one at Bedtime Dofetilide 250 mg: Take one cap at Breakfast and one at Bedtime Spironolactone 25 mg: Take half tab at Breakfast Metoprolol 50 mg: Take one tab at Breakfast and 2 tabs at Bedtime Pen Needles Dexcom G7 Sensors     Confirmed delivery date of 06/05/22, advised patient that pharmacy will contact them the morning of delivery.     Patient is due for next adherence delivery on: 07/04/22. Called patient and reviewed medications and coordinated delivery. Packs 30 DS  This delivery to include:  Torsemide 20 mg: Take 3 tabs before breakfast Eliquis 5 mg: Take one tab at Breakfast and one tab at Bedtime Atorvastatin 40 mg: take one tab at Breakfast Diltiazem 360 mg: Take one tab at Breakfast Duloxetine 60 mg: Take one cap at Breakfast Jardiance 25 mg: Take one tab before Breakfast Metformin 1000 mg: Take one tab at Breakfast and one tab at [place] Potassium  20 Meq: Take one at Bedtime Omeprazole 20 mg: Take one cap before Breakfast and one at Bedtime Dofetilide 250 mg: Take one cap at Breakfast and one at Bedtime Spironolactone 25 mg: Take half tab at Breakfast Metoprolol 50 mg: Take one tab at Breakfast and 2 tabs at Bedtime Dexcom [place]   Patient declined the following medications (meds) due to (reason) Pen Needles- hold due to abundance   Confirmed delivery date of 07/04/22, advised patient that pharmacy will contact them the morning of delivery.   ALBUTEROL SULFATE HFA  108 (90 Base) MCG/ACT AERS 05/28/2022 30   BENZONATATE 100 MG CAPSULE 05/04/2022 30   NITROGLYCERIN 0.4 MG TABLET SL 05/04/2022 7     Care Gaps: Diabetic Urine - Overdue TDAP  -Overdue Foot Exam - Overdue COVID Booster - Overdue Zoster Vaccine - Postponed AWV- 01/12/22 BP- 120/70 05/09/22 Lab Results  Component Value Date   HGBA1C 7.0 (A) 04/18/2022  Star Rating Drugs: Metformin (Glucophage) 500 mg - Last filled 05/31/22 56 DS  [facility]  Empagliflozin (Jardiance) 25 mg - Last filled 06/05/22 19 DS [facility] Atorvastatin (Lipitor) 40 mg - Last filled 05/31/22 86 DS [facility]      [place] Clinical Pharmacist Assistant [phone]

## 2022-06-21 DIAGNOSIS — G4733 Obstructive sleep apnea (adult) (pediatric): Secondary | ICD-10-CM | POA: Diagnosis not present

## 2022-06-21 DIAGNOSIS — I10 Essential (primary) hypertension: Secondary | ICD-10-CM | POA: Diagnosis not present

## 2022-06-21 DIAGNOSIS — I272 Pulmonary hypertension, unspecified: Secondary | ICD-10-CM | POA: Diagnosis not present

## 2022-06-21 DIAGNOSIS — G4710 Hypersomnia, unspecified: Secondary | ICD-10-CM | POA: Diagnosis not present

## 2022-06-22 ENCOUNTER — Ambulatory Visit: Payer: Medicare Other | Admitting: Internal Medicine

## 2022-06-29 ENCOUNTER — Other Ambulatory Visit: Payer: Self-pay

## 2022-06-29 MED ORDER — POTASSIUM CHLORIDE CRYS ER 20 MEQ PO TBCR
20.0000 meq | EXTENDED_RELEASE_TABLET | Freq: Every day | ORAL | 3 refills | Status: DC
  Filled 2023-02-23: qty 90, 90d supply, fill #0

## 2022-07-07 DIAGNOSIS — J9601 Acute respiratory failure with hypoxia: Secondary | ICD-10-CM | POA: Diagnosis not present

## 2022-07-07 DIAGNOSIS — J8410 Pulmonary fibrosis, unspecified: Secondary | ICD-10-CM | POA: Diagnosis not present

## 2022-07-07 DIAGNOSIS — J849 Interstitial pulmonary disease, unspecified: Secondary | ICD-10-CM | POA: Diagnosis not present

## 2022-07-09 NOTE — Patient Instructions (Signed)
ICD-10-CM   1. Chronic respiratory failure with hypoxia (HCC)  J96.11     2. Hypersensitivity pneumonia (HCC)  J67.9     3. ILD (interstitial lung disease) (HCC)  J84.9     4. Chronic diastolic CHF (congestive heart failure) (HCC)  I50.32     5. WHO group 3 pulmonary arterial hypertension (HCC)  I27.23          Right heart cath OCt 2023 showed resolved pulmonary congestion compared to July 2023 Right heart cath Currently you do have pulmonary hypertension from sleep apnea You might still have ILD from  chronic hypersensitivity pneumonitis  to explain your o2 need  Plan - do simple walking desaturation test on room air 07/10/2022  - continue 2-4L [place] at home  - - do full PFT first available in 3 months - do HRCT supine and prone in 3 months with insipratory and expiratory volume   = help establish for sure if there is ILD or not  - talk to PCP [name], Neta Mends, MD about ozempic or wegovy - weight loss needed  - discussed tyvaso for pulmonary hypertension  - shared decision making and will hold off  Followup  -3onths but after PFT and HRCT - 30 min visit  - simple walk test at followup

## 2022-07-09 NOTE — Progress Notes (Unsigned)
IOV 12/05/2016  Chief Complaint  Patient presents with   PULMONARY CONSULT    PULMONARY CONSULT FOR SOB referring doctor is Dr. [name]. DME is [facility] patient sleeps 8 hours a night with her CPAP machine     77 year old obese femal. She was admitted 11/12/2016 through 11/15/2016 for acute on chronic diastolic congestive heart failure. She is known to have sleep apnea essential hypertension coronary artery disease status post PTCA, paroxysmal A. fib, type 2 diabetes. She tells me that around 6 years ago she was diagnosed to have sleep apnea and started on CPAP therapy. Around this time she developed insidious onset of shortness of breath. Since then it has been progressively has been present on exertion relieved by rest. Class II-III activities bring it on. There is associated cough for the last 3 years. Both symptoms are progressive and is currently moderate in severity. Cough quality is dry. There is no associated chest pain or any radiation. There is no associated wheezing or orthopnea. Dyspnea can get worse during episodes of heart failure or atrial fibrillation. She's been on amiodarone therapy for the last 3 months after failing Tikosyn. She status post ablation. After the admission in May 2018 for heart failure that is concerned that she might have interstitial lung disease that she's been referred here. She feels currently well diuresed   Last chest x-ray 11/14/2016: Personally visualized and it looks like she has interstitial markings as opposed to heart failure   Echocardiogram April 2018: Pulmonary artery systolic pressure of 53 mmHg associated with grade 2 diastolic dysfunction  OV 01/06/2375  Chief Complaint  Patient presents with   Follow-up    Pt here after HRCT. Pt states [place] and states she wasnt SOB at all but when she returned home she was more SOB. Pt denies cough and CP/tightness.     Follow-up chronic diastolic heart failure and  amiodarone therapy with? Of interstitial lung disease   Last visit June 2018 there was question of interstitial lung disease therefore we did a high-resolution CT chest with thoracic radiology believes that the findings are consistent with pulmonary edema. Therefore I held off on interstitial lung disease workup. In the interim she has lost 7 pounds. She went to the mountains and was active and return on the last week and she says that her dyspnea is actually better. She continues to be on 80 mg Lasix in the morning with 40 mg Lasix at night on her stable dose for several months. Because of the weight loss is actually feeling better although after return to [place] on lower altitude she says some of the shortness of breath is better but she still better than before. Walking desaturation test 185 feet 3 laps on room air: She walk only one lap and stopped due to knee pain she is upcoming knee replacement. A pulse ox did go down to 93% but it was still adequate.    IMPRESSION: 1. Cardiomegaly. Diffuse interlobular septal thickening, parahilar ground-glass attenuation and upper lung predominant mild patchy consolidation. These findings are most compatible with pulmonary edema due to congestive heart failure. 2. No specific findings of interstitial lung disease. No hyperdense foci of consolidation in the lungs specific for amiodarone pulmonary toxicity. 3. Mild to moderate mediastinal and mild bilateral hilar lymphadenopathy, stable since 09/25/2016, suggesting reactive/congestive adenopathy. 4. Three-vessel coronary atherosclerosis.   Aortic Atherosclerosis (ICD10-I70.0).     Electronically Signed   By: Corene Cornea  A Poff M.D.   On: 01/18/2017 16:41     OV 04/05/2017  Chief Complaint  Patient presents with   Follow-up    Pt states that she went to the mountains this past weekend 9/29-9/30 and breathing was a little labored then. Pt is working on losing weight which is helping with her  breathing. Still becomes SOB on exertion and c/o sinus drainage. Denies any cough or CP.    259# -> 252# -> 245# with [place]   OCt 2020 tele visit    Today's tele-visit is a follow-up for sleep apnea.  She was last seen in 2018. Patient says she has been doing well on CPAP.  Says she never misses a night.  She wears it all night for at least 8 to 9 hours.  She feels rested with no significant daytime sleepiness.  States her mask is working well.  She does need order for new CPAP supplies. She is trying to lose weight but has not been as active with the COVID 19 virus  CPAP download shows excellent compliance with 100% usage.  She uses daily 9 hours.  She is on CPAP 13 cm H2O.  AHI 0.6.  Inpatient consult Nov 2021   Background Pulm History  - Obese lady.  With coronary artery disease that is post PTCA, A. fib, type 2 diabetes and hypertension. .  Used to be followed by Dr Rachell Cipro - [id number] for OSA (AHI 21/h in 2010)  and CPAP for which back then good compliance was reported.  Then in May 2018 2018 she she had an admission for diastolic heart failure.  In the aftermath of that she continues to have dyspnea and exertional desaturation in the setting of amiodarone therapy and there was concern that she had interstitial lung disease and was seen in the pulmonary clinic.  However radiology felt the CT scan was most consistent with heart failure.  With subsequent diuresis at follow-up in October 2018 she was improved though there was still crackles and there is clinical concern for ILD.  She was referred for heart catheterization and underwent this in November 2018.  Her V waves are prominent.  Her mean pulmonary pressure was elevated at 48 mm per mercury and a high cardiac output of 8.5 L/min with only minimally elevated pulmonary vascular resistance of 3 Wood units.  Dr. Burt Knack recommended pulmonary vein isolation to rule out pulmonary vein stenosis.  In addition patient also had single-vessel  coronary artery disease but with continued patency of the stented segment in the right coronary artery.   After that she was lost to pulmonary follow-up.  Review of the chart indicate atrial fibrillation has been a problem and an ablation has been planned by Dr. Curt Bears in January 2022.  She appears to no longer be on amiodarone.?  Stop date   She really presents now on May 19, 2020.  She tells at baseline she was not using oxygen at home.  She was exposed to her husband was sick with nonproductive cough.  Then she developed pain in her left ear.  She took outpatient antibiotics but did not respond she got progressively short of breath and got admitted to the hospital.  He had Covid and flu test have been negative.  She has had a Covid vaccine.  She required facemask and nasal cannula oxygen.  Chest x-ray just shows hyperinflation.  She had a CT chest without contrast [not a high-resolution CT chest] shows groundglass opacities in the upper lobes but  otherwise no presence of chronic ILD.   She is on chronic eliquids   Pulmonary has been consulted    Dc summar 06/02/20  Acute hypoxemic respiratory failure/acute on chronic diastolic heart failure /Pulmonary hypertension Echocardiogram with preserved LV systolic function EF 55 to 60%, elevated PA pressure 63 mmHg systolic.  Patient was seen by cardiology.  Patient was placed on diuretics with good diuresis.  Her oxygen requirements have improved.  She is now saturating normal on room air.  She is ambulated without any difficulty.     Atrial fibrillation with rapid ventricular response The patient has converted to 2:1 flutter, rate in the 70's.  Seen by cardiology.  Will be discharged on beta-blocker diltiazem and dofetilide.  Continue apixaban.  She has an appointment with cardiology in January to consider ablation.      RSV bronchitis/ viral pneumonia (present on admission) Supportive care was provided.  Due to wheezing she was started on  steroids with improvement.  She will be discharged on tapering doses.     Hyperglycemia due to steroids HbA1c noted to be 8.1.  Patient not on any glucose lowering agents at home.  This will need further evaluation in the outpatient setting.   Delirium/Sundowning Patient with hallucinations.  Likely due to medications and hospital stay.  Resolved.    Hyperlipidemia Continue with atorvastatin.    CKD II Creatinine noted to be 1.57 today.  She has good urine output.  Will recommend renal function be checked in the outpatient setting at follow-up..    Depression Cntinue with duloxetine  OV 09/21/2020  Subjective:  Patient ID: [name], female , DOB: [birth year] , age 89 y.o. , MRN: [id number] , ADDRESS: [place]  [id number] PCP [name], [name], MD Patient Care Team: [name], MD as PCP - General Curt Bears, Ocie Doyne, MD as PCP - Electrophysiology (Cardiology) Nahser, Wonda Cheng, MD as PCP - Cardiology (Cardiology) Gaynelle Arabian, MD as Consulting Physician (Orthopedic Surgery) Brand Males, MD as Consulting Physician (Pulmonary Disease) Earnie Larsson, [facility] as Pharmacist (Pharmacist)  This Provider for this visit: Treatment Team:  Attending Provider: Brand Males, MD    09/21/2020 -   Chief Complaint  Patient presents with   [facility] follow up-Shortness of breath with activity     [name] 77 y.o. -post hospital follow-up for acute respiratory failure because of diastolic dysfunction and RSV bronchopneumonia in #2021.  She was extremely hypoxemic requiring high flow nasal cannula.  She required lot of diuresis and time.  She was discharged without oxygen.  Currently she says she is compliant with the CPAP without oxygen.  She is not needing any oxygen at rest or with exertion.  She has class II dyspnea on exertion relieved by rest no chest pain.  She feels his dyspnea is baseline prehospitalization and is related to  obesity.  Overall she is feeling good.  No orthopnea.  No proximal nocturnal dyspnea.  No cough.  No wheezing.   [facility]23[facility] follow-up Patient presents today for hospital follow-up.  Patient was admitted from 12/10/2021 - 12/16/2021 for acute hypoxic respiratory failure secondary to acute on chronic diastolic heart failure, pulmonary fibrosis and presumed OSA/OHS.  Patient had questionable severe PAH noted on right heart cath in 2018.  No mention of pulmonary hypertension on patient's 2D echo at this time.  She was diuresed 7 L, still requiring 2 L supplemental oxygen.  CT chest from April 2022 concerning for possible pulmonary  fibrosis confirmed on high-resolution CT.  Patient had RSV in 2020 and has seen Dr. Chase Caller outpatient.  She will continue on torsemide, supplemental oxygen and CPAP at night.             Respiratory failure multifactorial due to CHF, pulmonary fibrosis and OSA.  She has abnormal groundglass opacities on HRCT from 6/13.  This is not significantly changed from prior scans over the past couple years.  Felt to clearly resemble pulmonary fibrosis.  No role for steroids or antibiotics at this time.  Recommend repeating serology.  She is feeling better since discharged She has noticed increased swelling in her feet/ankles and hands She is following with HF clinic, seen on Monday  She is on Torsemide '40mg'$  daily  Planning for heart cath in July with cardiology   TESTING: Echo 6/10 > LVEF 60-65%, Normal valves. RV, LV appear normal.  HRCT 6/13 independently reviewed shows scattered groundglass opacities bilateral consistent with multifocal inflammation and unchanged prior appearance of septal thickening and mosaic attenuation with lobular air trapping on expiration.  Interestingly nodular cirrhotic morphology of the liver was noted 06/2020 CT cors/pulmonary vein >> atypical left pulmonary vein drainage into left atrium   RHC July 2023  Right Heart Pressures [facility] Procedural  Findings: Hemodynamics (mmHg) RA mean 14 RV 68/18 PA 63/24, mean 45 PCWP mean 25 with v-waves to 35 (prominent)  Oxygen saturations: PA 71% AO 92%  Cardiac Output (Fick) 7.7  Cardiac Index (Fick) 3.5 PVR 2.6 WU  PAPi 2.8    OV 04/04/2022  Subjective:  Patient ID: [name], female , DOB: August 11, 1945 , age 40 y.o. , MRN: [id number] , ADDRESS: [address] [place] [address] PCP [name], [name], MD Patient Care Team: [name], MD as PCP - General (Internal Medicine) Constance Haw, MD as PCP - Electrophysiology (Cardiology) [name], MD as PCP - Cardiology (Cardiology) Gaynelle Arabian, MD as Consulting Physician (Orthopedic Surgery) Brand Males, MD as Consulting Physician (Pulmonary Disease) Viona Gilmore, [facility] as Pharmacist (Pharmacist)  This Provider for this visit: Treatment Team:  Attending Provider: Brand Males, MD    04/04/2022 -   Chief Complaint  Patient presents with   Follow-up    Breathing is much improved. She has minimal, non prod cough at night.      HPI [name] 77 y.o. -morbidly obese lady who I have followed intermittently since 2018.  She is here with her husband who I am meeting for the first time.  Husband is a Clinical biochemist and does work for Dr. Ar[employer].  Followed intermittently since 2018.  Back in 2018 there was question of interstitial lung disease but radiology felt findings on CT scan were more consistent with heart failure.  She is known to have obesity, diastolic dysfunction, pulmonary venous hypertension with prominent V waves and sleep apnea on CPAP.  However in 7829 course was complicated by RSV pneumonia and respiratory failure on admission.  In this admission 2 she required a lot of diuresis.  Then I last saw her in March 2022 after that not seen her for follow-up.  She then got hospitalized in June 2023 with similar admission.  Review of the notes indicate significant acute on  chronic diastolic heart failure requiring significant amount of diuresis but also concern for associated interstitial lung disease.  After diuresis she is significantly improved but still was discharged on approximately 3 L of oxygen.  She says that right now when she  walks 10 feet on room air she will get dyspneic and wear the oxygen although she is not sure if she desaturates.  Walking oxygen desaturation test on room air she got dyspneic walking just 2 out of 3 laps but did not desaturate below 92%.  Overall she does feel better.    Since her admission she has followed up with nurse practitioner who did a serology profile which was negative but hypersensitivity pneumonitis profile is positive for Aspergillus Antibody.  With the CT findings of groundglass opacities concern is for hypersensitive pneumonitis.  However I repeatedly asked her whether she had any birds or down jacket or down pillow or down sulfa or any mildew exposure during the course of her life or any mold exposure.  She denied all this.  She is worked as a Scientist, physiological in the   but has been in different buildings but does not recall any mold or musty smell.  This also includes a home.     OV 07/10/2022  Subjective:  Patient ID: [name], female , DOB: 01-Apr-1946 , age 52 y.o. , MRN: [id number] , ADDRESS: [place] [phone] PCP [name], [name], MD Patient Care Team: [name], MD as PCP - General (Internal Medicine) Constance Haw, MD as PCP - Electrophysiology (Cardiology) [name], MD as PCP - Cardiology (Cardiology) Gaynelle Arabian, MD as Consulting Physician (Orthopedic Surgery) Brand Males, MD as Consulting Physician (Pulmonary Disease) Viona Gilmore, [facility] as Pharmacist (Pharmacist) Troy Sine, MD as Consulting Physician (Cardiology)  This Provider for this visit: Treatment Team:  Attending Provider: Brand Males, MD    07/10/2022 -   Chief Complaint   Patient presents with   Follow-up   #Multifactorial chronic hypoxic respiratory failure  -Possible ILD with hypersensitive pneumonitis [CT scan in 2023 showed air trapping with possible groundglass and serum hypersensitivity Aspergillus profile positive but no known exposures]  -Pulmonary hypertension   -Venous congestion with elevated wedge in summer 2023 followed by euvolemia but still persistent elevated mean pulmonary pressure in the 30s in October 2023  -Obesity  -Keep apnea on CPAP followed by Dr. Annamaria Boots  HPI [name] 77 y.o. -returns for follow-up.  After the last visit she underwent right heart catheterization after he spoke to Dr. Johney Frame.  Her pulmonary wedge pressure improved significantly to 16 with a mean pulmonary artery pressures 35 mmHg.  This information was obtained by review of the external records.  She presents with her husband who feels that she is currently stable.  At this point in time she still uses 3 L oxygen.  She is stable.  In October 2023 she did have pulmonary function test and this is stable.  I personally visualized the CT scan and also showed it to her.  Showed a possible groundglass opacities versus air trapping.  We discussed treatment for pulmonary hypertension but because she is doing well she and her husband felt this is best that she just maintain stability with the current treatment regimen.  We did discuss inhaled treprostinil and the side effects.  Currently she does not have much cough.  She only has dyspnea on exertion class III so she will just wants expectant follow-up.  She did complain of dizziness when she bends down and I advised her not to crunch her diaphragm.  They are willing for repeat CT scan and pulmonary function test.    SYMPTOM SCALE - ILD 04/04/2022  Current weight   O2 use  3L at home  Shortness of Breath 0 -> 5 scale with 5 being worst (score 6 If unable to do)  At rest 5  Simple tasks - showers, clothes change, eating, shaving  3  Household (dishes, doing bed, laundry) 2  Shopping 2  Walking level at own pace 4  Walking up Stairs 2  Total (30-36) Dyspnea Score 18  How bad is your cough? 4  How bad is your fatigue 4  How bad is nausea 0  How bad is vomiting?  0  How bad is diarrhea? 2  How bad is anxiety? 3  How bad is depression 2  Any chronic pain - if so where and how bad x       Simple office walk 185 feet x  3 laps goal with forehead probe 09/21/2020  04/04/2022  07/10/2022 Had to leave before could be walked  O2 used ra Ra walk (uses 3L o2 at hom 24/7) - IMPROVED   Number laps completed avg Only 2 of 3 and stopped due to fatigue, back pain and dyspnea   Comments about pace 3 lap 2   Resting Pulse Ox/HR 94% and 66/min 92% ANd HR 61   Final Pulse Ox/HR 91% and 90/min 92% and HR 73   Desaturated </= 88% no no   Desaturated <= 3% points yes no   Got Tachycardic >/= 90/min yes no   Symptoms at end of test soe dyspnea Dyspnea, back pain   Miscellaneous comments x DID NOT deags     Right Heart Pressures [facility] Procedural Findings: Hemodynamics (mmHg) - OCT 2023 RA mean 4 RV 49/7 PA 50/24, mean 35 PCWP mean 16  Oxygen saturations: PA 74% AO 95%  Cardiac Output (Fick) 6.4  Cardiac Index (Fick) 2.99 PVR 2.97 WU    PFT     Latest Ref Rng & Units 04/14/2022   10:52 AM 11/03/2016   11:34 AM  PFT Results  FVC-Pre L 1.49  1.38   FVC-Predicted Pre % 48  41   FVC-Post L 1.53  1.55   FVC-Predicted Post % 49  47   Pre FEV1/FVC % % 83  81   Post FEV1/FCV % % 87  85   FEV1-Pre L 1.25  1.12   FEV1-Predicted Pre % 53  44   FEV1-Post L 1.33  1.32   DLCO uncorrected ml/min/mmHg 15.83  14.71   DLCO UNC% % 76  53   DLCO corrected ml/min/mmHg 14.91  14.06   DLCO COR %Predicted % 72  50   DLVA Predicted % 135  88   TLC L 4.34  3.76   TLC % Predicted % 79  69   RV % Predicted % 111  103        has a past medical history of Anticoagulant long-term use, Anxiety, Arthritis, CAD (coronary artery  disease) (8887,5797), CHF (congestive heart failure) ([place]), Chronic atrial fibrillation ([place]) (06/2007), Chronic kidney disease, CVA (cerebral vascular accident) ([place]) (2820,6015), Depression, Diplopia (06/19/2008), Dysrhythmia, Edema of lower extremity, Hyperlipidemia, Hypertension, Inferior myocardial infarction [facility]), Myocardial infarction ([place]) (6153,7943), Obesity, OSA on CPAP, Pacemaker, Pneumonia (2014), Pulmonary hypertension ([place]), Shortness of breath, Skin cancer, Sleep apnea, Spondylolisthesis, TIA (transient ischemic attack) (2008), and Unspecified hemorrhoids without mention of complication (2/76/1470).   reports that she quit smoking about 40 years ago. Her smoking use included cigarettes. She started smoking about 44 years ago. She has a 5.00 pack-year smoking history. She has never used smokeless tobacco.  Past Surgical History:  Procedure Laterality Date   ABDOMINAL HYSTERECTOMY     APPENDECTOMY  1984   ATRIAL FIBRILLATION ABLATION N/A 09/29/2016   Procedure: Atrial Fibrillation Ablation;  Surgeon: Will Meredith Leeds, MD;  Location: [place] CV LAB;  Service: Cardiovascular;  Laterality: N/A;   ATRIAL FIBRILLATION ABLATION N/A 02/07/2018   Procedure: ATRIAL FIBRILLATION ABLATION;  Surgeon: Constance Haw, MD;  Location: [place] [facility] LAB;  Service: Cardiovascular;  Laterality: N/A;   ATRIAL FIBRILLATION ABLATION N/A 03/13/2019   Procedure: ATRIAL FIBRILLATION ABLATION;  Surgeon: Constance Haw, MD;  Location: [place] CV LAB;  Service: Cardiovascular;  Laterality: N/A;   ATRIAL FIBRILLATION ABLATION N/A 07/07/2020   Procedure: ATRIAL FIBRILLATION ABLATION;  Surgeon: Constance Haw, MD;  Location: [place] CV LAB;  Service: Cardiovascular;  Laterality: N/A;   BACK SURGERY     CARDIOVERSION N/A 09/12/2017   Procedure: CARDIOVERSION;  Surgeon: Jerline Pain, MD;  Location: [facility] ENDOSCOPY;  Service: Cardiovascular;  Laterality: N/A;   CARDIOVERSION N/A 12/13/2017    Procedure: CARDIOVERSION;  Surgeon: Sanda Klein, MD;  Location: [place] ENDOSCOPY;  Service: Cardiovascular;  Laterality: N/A;   CARDIOVERSION N/A 02/18/2018   Procedure: CARDIOVERSION;  Surgeon: Dorothy Spark, MD;  Location: [facility] ENDOSCOPY;  Service: Cardiovascular;  Laterality: N/A;   CARDIOVERSION N/A 05/20/2018   Procedure: CARDIOVERSION;  Surgeon: [name], MD;  [facility];  Service: Cardiovascular;  Laterality: N/A;   CARDIOVERSION N/A 04/16/2019   Procedure: CARDIOVERSION;  Surgeon: Donato Heinz, MD;  Location: [place];  Service: Endoscopy;  Laterality: N/A;   CARDIOVERSION N/A 01/22/2020   Procedure: CARDIOVERSION;  Surgeon: Geralynn Rile, MD;  Location: [place];  Service: Cardiovascular;  Laterality: N/A;   CARDIOVERSION N/A 02/18/2020   Procedure: CARDIOVERSION;  Surgeon: Elouise Munroe, MD;  Location: [facility] ENDOSCOPY;  Service: Cardiovascular;  Laterality: N/A;   CATARACT EXTRACTION W/ INTRAOCULAR LENS  IMPLANT, BILATERAL Bilateral 01/15/2017- 03/2017   CHOLECYSTECTOMY     CORONARY ANGIOPLASTY  X 2   CORONARY ANGIOPLASTY WITH [place]; ~ 2007; ?date   "1 stent; replaced stent; not sure when I got the last stent" (04/23/2017)   DOPPLER ECHOCARDIOGRAPHY  2009   ELECTROPHYSIOLOGIC STUDY N/A 03/31/2016   Procedure: Cardioversion;  Surgeon: Evans Lance, MD;  Location: [place] CV LAB;  Service: Cardiovascular;  Laterality: N/A;   ELECTROPHYSIOLOGIC STUDY N/A 08/04/2016   Procedure: Cardioversion;  Surgeon: Evans Lance, MD;  Location: [place] CV LAB;  Service: Cardiovascular;  Laterality: N/A;   INSERT / REPLACE / REMOVE PACEMAKER  06/2007   IR RADIOLOGY PERIPHERAL GUIDED IV START  01/31/2018   IR US GUIDE VASC ACCESS LEFT  01/31/2018   JOINT REPLACEMENT     LAPAROSCOPIC CHOLECYSTECTOMY  1994   LEFT AND RIGHT HEART CATHETERIZATION WITH CORONARY ANGIOGRAM N/A 10/06/2013   Procedure: LEFT AND RIGHT HEART CATHETERIZATION  WITH CORONARY ANGIOGRAM;  Surgeon: Troy Sine, MD;  Location: [facility] CATH LAB;  Service: Cardiovascular;  Laterality: N/A;   LEFT OOPHORECTOMY Left ~ [place]  2000s - 04/2015 X 3   L3-4; L4-5; L2-3; Dr Trenton Gammon   RIGHT HEART CATH N/A 01/04/2022   Procedure: RIGHT HEART CATH;  Surgeon: Larey Dresser, MD;  Location: [place] CV LAB;  Service: Cardiovascular;  Laterality: N/A;   RIGHT HEART CATH N/A 05/02/2022   Procedure: RIGHT HEART CATH;  Surgeon: Larey Dresser, MD;  Location: [place] CV LAB;  Service: Cardiovascular;  Laterality: N/A;   RIGHT/LEFT HEART CATH AND CORONARY ANGIOGRAPHY N/A 05/09/2017   Procedure: RIGHT/LEFT HEART CATH AND CORONARY ANGIOGRAPHY;  Surgeon: Sherren Mocha, MD;  Location: [place];  Service: Cardiovascular;  Laterality: N/A;   SKIN CANCER EXCISION Right    [place]   TEE WITHOUT CARDIOVERSION N/A 09/29/2016   Procedure: TRANSESOPHAGEAL ECHOCARDIOGRAM (TEE);  Surgeon: Jerline Pain, MD;  Location: [facility];  Service: Cardiovascular;  Laterality: N/A;   TOTAL ABDOMINAL HYSTERECTOMY  1984   "uterus & right ovary"   TOTAL KNEE ARTHROPLASTY Left 04/23/2017   TOTAL KNEE ARTHROPLASTY Left 04/23/2017   Procedure: TOTAL KNEE ARTHROPLASTY;  Surgeon: Frederik Pear, MD;  Location: [place];  Service: Orthopedics;  Laterality: Left;   ULTRASOUND GUIDANCE FOR VASCULAR ACCESS  05/09/2017   Procedure: Ultrasound Guidance For Vascular Access;  Surgeon: Sherren Mocha, MD;  Location: [place] [facility];  Service: Cardiovascular;;    Allergies  Allergen Reactions   Hydrocodone-Guaifenesin Other (See Comments)    Confusion/ memory loss   Adhesive [Tape] Rash and Other (See Comments)    Allergic to defibrillation pads.    Immunization History  Administered Date(s) Administered   Fluad Quad(high Dose 65+) 04/10/2019, 04/20/2021, 04/04/2022   Influenza Split 04/18/2011, 05/06/2012   Influenza Whole 07/04/2007, 06/02/2009, 03/01/2010    Influenza, High Dose Seasonal PF 04/03/2014, 04/18/2016, 04/05/2017, 05/06/2018   Influenza,inj,Quad PF,6+ Mos 03/07/2013, 04/06/2015   PFIZER Comirnaty(Gray Top)Covid-19 Tri-Sucrose Vaccine 04/05/2022   PFIZER(Purple Top)SARS-COV-2 Vaccination 07/22/2019, 08/10/2019, 03/29/2020   PNEUMOCOCCAL CONJUGATE-20 04/22/2021   Pfizer Covid-19 Vaccine Bivalent Booster 33yr & up 04/22/2021   Pneumococcal Conjugate-13 09/24/2013, 04/20/2020   Pneumococcal Polysaccharide-23 07/03/2005, 07/04/2007, 04/18/2011, 04/05/2022   Respiratory Syncytial Virus Vaccine,Recomb Aduvanted(Arexvy) 04/05/2022   Td 06/02/2009   Zoster Recombinat (Shingrix) 04/09/2020   Zoster, Live 07/03/2006    Family History  Problem Relation Age of Onset   Suicidality Father        suicide death pt was 3 yrs, 89  Arrhythmia Mother    Hypertension Mother    Diabetes Mother    Dementia Mother    Heart attack Brother    Heart disease Paternal Aunt    Prostate cancer Maternal Grandfather    Diabetes Paternal Grandfather        fathers side of the family   Colon cancer Maternal Aunt      Current Outpatient Medications:    fluticasone (FLONASE) 50 MCG/ACT nasal spray, Place 1 spray into both nostrils daily as needed for allergies., Disp: , Rfl:    albuterol (VENTOLIN HFA) 108 (90 Base) MCG/ACT inhaler, Inhale 2 puffs into the lungs every 4 (four) hours as needed for wheezing or shortness of breath. (Patient not taking: Reported on 07/10/2022), Disp: 1 each, Rfl: 1   apixaban (ELIQUIS) 5 MG TABS tablet, Take 1 tablet (5 mg total) by mouth 2 (two) times daily., Disp: 180 tablet, Rfl: 1   atorvastatin (LIPITOR) 40 MG tablet, TAKE ONE TABLET BY MOUTH ONCE DAILY, Disp: 90 tablet, Rfl: 1   BD PEN NEEDLE NANO 2ND GEN 32G X 4 MM MISC, daily., Disp: , Rfl:    benzonatate (TESSALON PERLES) 100 MG capsule, Take 1 capsule (100 mg total) by mouth 3 (three) times daily as needed for cough. (Patient not taking: Reported on 07/10/2022), Disp:  90 capsule, Rfl: 0   Cholecalciferol (DIALYVITE VITAMIN D 5000) 125 MCG (5000 UT) capsule, Take 5,000 Units by mouth daily., Disp: , Rfl:    Continuous Blood Gluc Receiver ([facility]  G7 RECEIVER) DEVI, Use as directed while on insulin, Disp: 1 each, Rfl: 3   Continuous Blood Gluc Sensor (DEXCOM G7 SENSOR) MISC, Use as directed while on insulin, Disp: 3 each, Rfl: 3   diltiazem (CARDIZEM LA) 360 MG 24 hr tablet, TAKE ONE TABLET BY MOUTH ONCE DAILY, Disp: 90 tablet, Rfl: 3   dofetilide (TIKOSYN) 250 MCG capsule, Take 1 capsule (250 mcg total) by mouth 2 (two) times daily. KEEP UPCOMING APPOINTMENT FOR FUTURE REFILLS., Disp: 180 capsule, Rfl: 3   DULoxetine (CYMBALTA) 60 MG capsule, TAKE ONE CAPSULE BY MOUTH ONCE DAILY, Disp: 90 capsule, Rfl: 0   empagliflozin (JARDIANCE) 25 MG TABS tablet, Take 1 tablet (25 mg total) by mouth daily., Disp: 90 tablet, Rfl: 1   insulin degludec (TRESIBA FLEXTOUCH) 100 UNIT/ML FlexTouch Pen, Inject 28 units into THE SKIN daily, Disp: 3 mL, Rfl: 3   loratadine (CLARITIN) 10 MG tablet, TAKE 1 TABLET BY MOUTH EVERY DAY (Patient not taking: Reported on 07/10/2022), Disp: 90 tablet, Rfl: 0   Lysine 1000 MG TABS, Take 1,000 mg by mouth at bedtime. , Disp: , Rfl:    metFORMIN (GLUCOPHAGE) 1000 MG tablet, TAKE ONE TABLET BY MOUTH TWICE DAILY, Disp: 180 tablet, Rfl: 0   metolazone (ZAROXOLYN) 2.5 MG tablet, TAKE 1 TABLET BY MOUTH EVERY 4  DAYS AS NEEDED FOR SWELLING  PLEASE KEEP UPCOMING APPOINTMENT FURTHER REFILLS, Disp: 25 tablet, Rfl: 2   metoprolol tartrate (LOPRESSOR) 50 MG tablet, TAKE ONE TABLET BY MOUTH EVERY MORNING and TAKE TWO TABLETS BY MOUTH EVERY EVENING, Disp: 270 tablet, Rfl: 3   Multiple Vitamin (MULTIVITAMIN WITH MINERALS) TABS tablet, Take 1 tablet by mouth daily. Centrum, Disp: , Rfl:    nitroGLYCERIN (NITROSTAT) 0.4 MG SL tablet, Place 1 tablet (0.4 mg total) under the tongue every 5 (five) minutes x 3 doses as needed for chest pain., Disp: 15 tablet, Rfl: 6   NON  FORMULARY, CPAP at bedtime, Disp: , Rfl:    nystatin-triamcinolone (MYCOLOG II) cream, Apply 1 application topically 2 (two) times daily as needed., Disp: 30 g, Rfl: 1   omeprazole (PRILOSEC) 20 MG capsule, Take 1 capsule (20 mg total) by mouth 2 (two) times daily., Disp: 60 capsule, Rfl: 2   Polyethyl Glycol-Propyl Glycol (SYSTANE) 0.4-0.3 % SOLN, Place 1 drop into both eyes daily as needed (dry eyes)., Disp: , Rfl:    potassium chloride SA (KLOR-CON M) 20 MEQ tablet, Take 1 tablet (20 mEq total) by mouth daily., Disp: 90 tablet, Rfl: 3   predniSONE (DELTASONE) 20 MG tablet, Take 1 tablet (20 mg total) by mouth 2 (two) times daily with a meal. May raise your blood sugar temporarily (Patient not taking: Reported on 07/10/2022), Disp: 10 tablet, Rfl: 0   spironolactone (ALDACTONE) 25 MG tablet, Take 0.5 tablets (12.5 mg total) by mouth daily., Disp: 45 tablet, Rfl: 3   torsemide (DEMADEX) 20 MG tablet, Take 3 tablets (60 mg total) by mouth daily., Disp: 270 tablet, Rfl: 3   valACYclovir (VALTREX) 1000 MG tablet, TAKE 2 TABLETS BY MOUTH EVERY 12 HOURS AS NEEDED, Disp: 34 tablet, Rfl: 2  Current Facility-Administered Medications:    sodium chloride flush (NS) 0.9 % injection 3 mL, 3 mL, Intravenous, Q12H, Marylu Lund., NP      Objective:   Vitals:   07/10/22 1436  BP: 110/70  Pulse: 68  Temp: 98 F (36.7 C)  SpO2: 93%  Weight: 235 lb (106.6 kg)  Height: 5' 6.5" (1.689 m)  Estimated body mass index is 37.36 kg/m as calculated from the following:   Height as of this encounter: 5' 6.5" (1.689 m).   Weight as of this encounter: 235 lb (106.6 kg).  '@WEIGHTCHANGE'$ @  Filed Weights   07/10/22 1436  Weight: 235 lb (106.6 kg)     Physical Exam   General: No distress. obese Neuro: Alert and Oriented x 3. GCS 15. Speech normal Psych: Pleasant Resp:  Barrel Chest - no.  Wheeze - no, Crackles - no, No overt respiratory distress CVS: Normal heart sounds. Murmurs - no Ext: Stigmata  of Connective Tissue Disease - no HEENT: Normal upper airway. PEERL +. No post nasal drip        Assessment:       ICD-10-CM   1. Chronic respiratory failure with hypoxia (HCC)  J96.11 Pulmonary function test    CT Chest High Resolution    2. Hypersensitivity pneumonia ([place])  J67.9     3. ILD (interstitial lung disease) ([place])  J84.9     4. Chronic diastolic CHF (congestive heart failure) (HCC)  I50.32     5. WHO group 3 pulmonary arterial hypertension (HCC)  I27.23          Plan:     Patient Instructions     ICD-10-CM   1. Chronic respiratory failure with hypoxia (HCC)  J96.11     2. Hypersensitivity pneumonia ([place])  J67.9     3. ILD (interstitial lung disease) ([place])  J84.9     4. Chronic diastolic CHF (congestive heart failure) (HCC)  I50.32     5. WHO group 3 pulmonary arterial hypertension (HCC)  I27.23          Right heart cath OCt 2023 showed resolved pulmonary congestion compared to July 2023 Right heart cath Currently you do have pulmonary hypertension from sleep apnea You might still have ILD from  chronic hypersensitivity pneumonitis  to explain your o2 need  Plan - do simple walking desaturation test on room air 07/10/2022  - continue 2-4L [place] at home  - - do full PFT first available in 3 months - do HRCT supine and prone in 3 months with insipratory and expiratory volume   = help establish for sure if there is ILD or not  - talk to PCP [name], [name], MD about ozempic or wegovy - weight loss needed  - discussed tyvaso for pulmonary hypertension  - shared decision making and will hold off  Followup  -3onths but after PFT and HRCT - 30 min visit  - simple walk test at followup  Moderate Complexity MDM NEW OFFICE  The table below is from the 2021 E/M guidelines, first released in 2021, with minor revisions added in 2023. Must meet the requirements for 2 out of 3 dimensions to qualify.    Number and complexity of problems addressed Amount  and/or complexity of data reviewed Risk of complications and/or morbidity  One or more chronic illness with mild exacerbation, progression, or side effects of treatment  Two or more stable chronic illnesses  One undiagnosed new problem with uncertain prognosis  One acute illness with systemic symptoms   Acute complicated injury Must meet the requirements for 1 of 3 of the categories)  Category 1: Tests and documents, historian  Any combination of 3 of the following:  Assessment requiring an independent historian  Review of prior external records  Review of results of each unique test  Ordering of each unique test    Category  2: Interpretation of tests  Independent interpretation of a test perfromed by another physician/NPP  Category 3: Discuss management/tests  Discussion of magagement or tests with an external physician/NPP Prescription drug management  Decision regarding minor surgery with identfied patient or procedure risk factors  Decision regarding elective major surgery without identified patient or procedure risk factors  Diagnosis or treatment significantly limited by social determinants of health              SIGNATURE    Dr. Brand Males, M.D., F.C.C.P,  Pulmonary and Critical Care Medicine Staff Physician, [place] Director - Interstitial Lung Disease  Program  Pulmonary [place] at [place], [place], [address]  Pager: [phone], If no answer or between  15:00h - 7:00h: call [phone] Telephone: [phone]  4:18 PM 07/10/2022

## 2022-07-10 ENCOUNTER — Ambulatory Visit: Payer: Medicare Other | Admitting: Internal Medicine

## 2022-07-10 ENCOUNTER — Encounter: Payer: Self-pay | Admitting: Internal Medicine

## 2022-07-10 VITALS — BP 110/70 | HR 68 | Temp 98.0°F | Ht 66.5 in | Wt 235.0 lb

## 2022-07-10 DIAGNOSIS — J9611 Chronic respiratory failure with hypoxia: Secondary | ICD-10-CM

## 2022-07-10 DIAGNOSIS — J849 Interstitial pulmonary disease, unspecified: Secondary | ICD-10-CM

## 2022-07-10 DIAGNOSIS — J679 Hypersensitivity pneumonitis due to unspecified organic dust: Secondary | ICD-10-CM

## 2022-07-10 DIAGNOSIS — I2723 Pulmonary hypertension due to lung diseases and hypoxia: Secondary | ICD-10-CM | POA: Diagnosis not present

## 2022-07-10 DIAGNOSIS — I5032 Chronic diastolic (congestive) heart failure: Secondary | ICD-10-CM

## 2022-07-21 ENCOUNTER — Telehealth: Payer: Self-pay | Admitting: Pharmacist

## 2022-07-21 NOTE — Progress Notes (Signed)
Care Coordination Pharmacy Assistant   Patient ID: [name], female   DOB: 1945-08-25, 77 y.o.   MRN: [id number]  Reason for Encounter: Medication Review Medication Coordination   Recent office visits:  None  Recent consult visits:  07/10/22 Brand Males, MD (Pulmon) - Patient presented for Chronic respiratory failure with hypoxia and other concerns. No medication changes.   Hospital visits:  Medication Reconciliation was completed by comparing discharge summary, patient's EMR and Pharmacy list, and upon discussion with patient.   Patient presented to [facility] on 05/02/22 due to Pulmonary Hypertension and Procedure.Patient was present for 4 hours.   New?Medications Started at [facility] Discharge:?? -started  none   Medication Changes at Hospital Discharge: -Changed  none   Medications Discontinued at Hospital Discharge: -Stopped  None   Medications that remain the same after Hospital Discharge:??  -All other medications will remain the same.      Medications: Outpatient Encounter Medications as of 07/21/2022  Medication Sig   albuterol (VENTOLIN HFA) 108 (90 Base) MCG/ACT inhaler Inhale 2 puffs into the lungs every 4 (four) hours as needed for wheezing or shortness of breath. (Patient not taking: Reported on 07/10/2022)   apixaban (ELIQUIS) 5 MG TABS tablet Take 1 tablet (5 mg total) by mouth 2 (two) times daily.   atorvastatin (LIPITOR) 40 MG tablet TAKE ONE TABLET BY MOUTH ONCE DAILY   BD PEN NEEDLE NANO 2ND GEN 32G X 4 MM MISC daily.   benzonatate (TESSALON PERLES) 100 MG capsule Take 1 capsule (100 mg total) by mouth 3 (three) times daily as needed for cough. (Patient not taking: Reported on 07/10/2022)   Cholecalciferol (DIALYVITE VITAMIN D 5000) 125 MCG (5000 UT) capsule Take 5,000 Units by mouth daily.   Continuous Blood Gluc Receiver (DEXCOM G7 RECEIVER) DEVI Use as directed while on insulin   Continuous Blood Gluc Sensor (DEXCOM G7 SENSOR) MISC  Use as directed while on insulin   diltiazem (CARDIZEM LA) 360 MG 24 hr tablet TAKE ONE TABLET BY MOUTH ONCE DAILY   dofetilide (TIKOSYN) 250 MCG capsule Take 1 capsule (250 mcg total) by mouth 2 (two) times daily. KEEP UPCOMING APPOINTMENT FOR FUTURE REFILLS.   DULoxetine (CYMBALTA) 60 MG capsule TAKE ONE CAPSULE BY MOUTH ONCE DAILY   empagliflozin (JARDIANCE) 25 MG TABS tablet Take 1 tablet (25 mg total) by mouth daily.   fluticasone (FLONASE) 50 MCG/ACT nasal spray Place 1 spray into both nostrils daily as needed for allergies.   insulin degludec (TRESIBA FLEXTOUCH) 100 UNIT/ML FlexTouch Pen Inject 28 units into THE SKIN daily   loratadine (CLARITIN) 10 MG tablet TAKE 1 TABLET BY MOUTH EVERY DAY (Patient not taking: Reported on 07/10/2022)   Lysine 1000 MG TABS Take 1,000 mg by mouth at bedtime.    metFORMIN (GLUCOPHAGE) 1000 MG tablet TAKE ONE TABLET BY MOUTH TWICE DAILY   metolazone (ZAROXOLYN) 2.5 MG tablet TAKE 1 TABLET BY MOUTH EVERY 4  DAYS AS NEEDED FOR SWELLING  PLEASE KEEP UPCOMING APPOINTMENT FURTHER REFILLS   metoprolol tartrate (LOPRESSOR) 50 MG tablet TAKE ONE TABLET BY MOUTH EVERY MORNING and TAKE TWO TABLETS BY MOUTH EVERY EVENING   Multiple Vitamin (MULTIVITAMIN WITH MINERALS) TABS tablet Take 1 tablet by mouth daily. Centrum   nitroGLYCERIN (NITROSTAT) 0.4 MG SL tablet Place 1 tablet (0.4 mg total) under the tongue every 5 (five) minutes x 3 doses as needed for chest pain.   NON FORMULARY CPAP at bedtime   nystatin-triamcinolone (MYCOLOG II) cream Apply 1  application topically 2 (two) times daily as needed.   omeprazole (PRILOSEC) 20 MG capsule Take 1 capsule (20 mg total) by mouth 2 (two) times daily.   Polyethyl Glycol-Propyl Glycol (SYSTANE) 0.4-0.3 % SOLN Place 1 drop into both eyes daily as needed (dry eyes).   potassium chloride SA (KLOR-CON M) 20 MEQ tablet Take 1 tablet (20 mEq total) by mouth daily.   predniSONE (DELTASONE) 20 MG tablet Take 1 tablet (20 mg total) by  mouth 2 (two) times daily with a meal. May raise your blood sugar temporarily (Patient not taking: Reported on 07/10/2022)   spironolactone (ALDACTONE) 25 MG tablet Take 0.5 tablets (12.5 mg total) by mouth daily.   torsemide (DEMADEX) 20 MG tablet Take 3 tablets (60 mg total) by mouth daily.   valACYclovir (VALTREX) 1000 MG tablet TAKE 2 TABLETS BY MOUTH EVERY 12 HOURS AS NEEDED   Facility-Administered Encounter Medications as of 07/21/2022  Medication   sodium chloride flush (NS) 0.9 % injection 3 mL   Reviewed chart for medication changes ahead of medication coordination call.  No OVs, Consults, or hospital visits since last care coordination call/Pharmacist visit. (If appropriate, list visit date, provider name)  No medication changes indicated OR if recent visit, treatment plan here.  BP Readings from Last 3 Encounters:  07/10/22 110/70  05/09/22 120/70  05/04/22 116/62    Lab Results  Component Value Date   HGBA1C 7.0 (A) 04/18/2022     Patient obtains medications through Adherence Packaging  30 Days   Last adherence delivery included:  Torsemide 20 mg: Take 3 tabs before breakfast Eliquis 5 mg: Take one tab at Breakfast and one tab at Bedtime Atorvastatin 40 mg: take one tab at Breakfast Diltiazem 360 mg: Take one tab at Breakfast Duloxetine 60 mg: Take one cap at Breakfast Jardiance 25 mg: Take one tab before Breakfast Metformin 1000 mg: Take one tab at Breakfast and one tab at [place] Potassium 20 Meq: Take one at Bedtime Omeprazole 20 mg: Take one cap before Breakfast and one at Bedtime Dofetilide 250 mg: Take one cap at Breakfast and one at Bedtime Spironolactone 25 mg: Take half tab at Breakfast Metoprolol 50 mg: Take one tab at Breakfast and 2 tabs at Bedtime Dexcom [place]     Patient declined the following medications (meds) due to (reason) Pen Needles- hold due to abundance     Confirmed delivery date of 07/04/22, advised patient that pharmacy will contact  them the morning of delivery.    ALBUTEROL SULFATE HFA  108 (90 Base) MCG/ACT AERS 05/28/2022 30    BENZONATATE 100 MG CAPSULE 05/04/2022 30    NITROGLYCERIN 0.4 MG TABLET SL 05/04/2022 7    Patient is due for next adherence delivery on: 08/02/22. Called patient and reviewed medications and coordinated delivery. Packs 30 DS  This delivery to include: Torsemide 20 mg: Take 3 tabs before breakfast Eliquis 5 mg: Take one tab at Breakfast and one tab at Bedtime Atorvastatin 40 mg: take one tab at Breakfast Diltiazem 360 mg: Take one tab at Breakfast Duloxetine 60 mg: Take one cap at Breakfast Jardiance 25 mg: Take one tab before Breakfast Metformin 1000 mg: Take one tab at Breakfast and one tab at [place] Potassium 20 Meq: Take one at Bedtime Omeprazole 20 mg: Take one cap before Breakfast and one at Bedtime Dofetilide 250 mg: Take one cap at Breakfast and one at Bedtime Spironolactone 25 mg: Take half tab at Breakfast Metoprolol 50 mg: Take one tab at  Breakfast and 2 tabs at Bedtime Dexcom G7 Sensors  Tyler Aas FlexTouch U-100 insulin 100 unit/mL (3 mL) subcutaneous pen 07/19/2022 33  T[place] to go out to pt on 2/19 made patient aware    Confirmed delivery date of 08/02/22, advised patient that pharmacy will contact them the morning of delivery.   Care Gaps: Diabetic Urine - Overdue TDAP - Overdue Zoster Vaccine - Overdue Foot Exam - Overdue  COVID Booster - Overdue Eye Exam - Overdue AWV- 01/12/22 BP-110/70 07/10/22 Lab Results  Component Value Date   HGBA1C 7.0 (A) 04/18/2022    Star Rating Drugs: Metformin (Glucophage) 500 mg - Last filled 06/29/22 30 DS  Upstream  Empagliflozin (Jardiance) 25 mg - Last filled 06/29/22 30 DS Upstream Atorvastatin (Lipitor) 40 mg - Last filled 06/29/22 30 DS Upstream     [place] Clinical Pharmacist Assistant [phone]

## 2022-07-22 DIAGNOSIS — G4733 Obstructive sleep apnea (adult) (pediatric): Secondary | ICD-10-CM | POA: Diagnosis not present

## 2022-07-22 DIAGNOSIS — I272 Pulmonary hypertension, unspecified: Secondary | ICD-10-CM | POA: Diagnosis not present

## 2022-07-22 DIAGNOSIS — I10 Essential (primary) hypertension: Secondary | ICD-10-CM | POA: Diagnosis not present

## 2022-07-22 DIAGNOSIS — G4710 Hypersomnia, unspecified: Secondary | ICD-10-CM | POA: Diagnosis not present

## 2022-08-04 DIAGNOSIS — I10 Essential (primary) hypertension: Secondary | ICD-10-CM | POA: Diagnosis not present

## 2022-08-04 DIAGNOSIS — G4710 Hypersomnia, unspecified: Secondary | ICD-10-CM | POA: Diagnosis not present

## 2022-08-04 DIAGNOSIS — G4733 Obstructive sleep apnea (adult) (pediatric): Secondary | ICD-10-CM | POA: Diagnosis not present

## 2022-08-07 DIAGNOSIS — J8410 Pulmonary fibrosis, unspecified: Secondary | ICD-10-CM | POA: Diagnosis not present

## 2022-08-07 DIAGNOSIS — J9601 Acute respiratory failure with hypoxia: Secondary | ICD-10-CM | POA: Diagnosis not present

## 2022-08-07 DIAGNOSIS — J849 Interstitial pulmonary disease, unspecified: Secondary | ICD-10-CM | POA: Diagnosis not present

## 2022-08-08 NOTE — Progress Notes (Unsigned)
Subjective:     Patient ID: [name], female   DOB: 24-Nov-1945,    MRN: [id number]  HPI  F quit smoking in 1984 followed previously by [facility] for osa on CPAP since 2010 NPSG 2010- AHI 21/ hr Hypersensitivity Pneumonia panel 12/29/2021 positive for Aspergillus fumigatus antibodies PFT 04/14/22- Moderate restriction, Increased Diffusion =============================================================  12/06/2015-77 year old female former smoker followed for OSA, complicated by morbid obesity, pulmonary hypertension, CAD/stents/ CHF PAfib/ pacemaker, history CVA, NPSG 2010- AHI 21/ hr New machine 2016 CPAP 13/ [name] pt-seen last by MW 06-2015; DME  [facility] Pt wears CPAP every night and needs new mask.  05/09/22- 77 year old female former smoker followed for OSA, complicated by Chronic Hypoxic Resp Failure, Hypersensitivity Pneumonia, morbid Obesity,  CAD/stents/ MI/CHF, PAfib/ pacemaker, dCHF,  history CVA, WHO3 Pulmonary Hypertension, CKD3,  -albuterol hfa,  O2 3L / [facility] new hosp 05/04/22 CPAP 13/ [facility] AirSense 10 AutoSet Body weight today-232 lbs Download compliance-93%, AHI 0.7/ hr Covid vax-5 Phizer RSV vax- had Flu vax-had Hosp June- Acute Hypoxic Respiratory Failure, WHO III PH followed by Cardiology. Pulmonary consult recommended Pulm f/u> O2 2L,  She is using her rescue inhaler 3 times daily.  It seems to help some.  Reviewing chart, imaging seems to indicate ILD/ Hypersensitivity Pneumonia pattern-discussed today. Lab-Hypersensitivity Pneumonia panel 12/29/2021 positive for Aspergillus fumigatus antibodies. She lives in a two-story home without pets or basement and says it is not moldy. Sputum now described as slightly yellow with cough x6 weeks like clearing her throat and getting better.  No fever, chills, adenopathy or rash. CPAP download reviewed.  She got her current replacement machine 2 months ago and it is working well.  HRCT chest 03/14/22- IMPRESSION: 1.  Significantly improved, although persistent ground-glass airspace opacity and consolidation throughout the upper lobes consistent with improved nonspecific infection or inflammation. 2. No significant change in previously described underlying mosaic attenuation of the airspaces with apical predominant irregular interstitial opacity and septal thickening. Lobular air trapping on expiratory phase imaging. Findings remain suggestive of chronic fibrotic hypersensitivity pneumonitis and are in an alternative diagnosis (not UIP) pattern per consensus guidelines: Diagnosis of Idiopathic Pulmonary Fibrosis: An Official ATS/ERS/JRS/ALAT Clinical Practice Guideline. [place], Iss 5, [phone], Mar 03 2017. 3. Unchanged prominent mediastinal and hilar lymph nodes, likely reactive. 4. Coronary artery disease. 5. Cirrhosis. Aortic Atherosclerosis (ICD10-I70.0).  08/10/22- 77 year old female former smoker followed for OSA, complicated by Chronic Hypoxic Resp Failure,    ( Dr Chase Caller), Hypersensitivity Pneumonia, morbid Obesity,  CAD/stents/ MI/CHF, PAfib/ pacemaker, dCHF,  history CVA, WHO3 Pulmonary Hypertension, CKD3,  -albuterol hfa,  O2 3L / [facility] new hosp 05/04/22 CPAP 13/ [facility] AirSense 10 AutoSet Body weight today- Download compliance- Covid vax-5 Phizer RSV vax- had Flu vax-had  CXR 04/18/22- IMPRESSION: No active cardiopulmonary disease.   ROS-see HPI  + = positive Constitutional:    weight loss, night sweats, fevers, chills, fatigue, lassitude. HEENT:    headaches, difficulty swallowing, tooth/dental problems, sore throat,       sneezing, itching, ear ache, nasal congestion, post nasal drip, snoring CV:    chest pain, orthopnea, PND, swelling in lower extremities, anasarca,                                                    dizziness, palpitations Resp:  shortness of breath with exertion or at rest.                productive cough,   non-productive cough,  coughing up of blood.              change in color of mucus.  wheezing.   Skin:    rash or lesions. GI:  No-   heartburn, indigestion, abdominal pain, nausea, vomiting, diarrhea,                 change in bowel habits, loss of appetite GU: dysuria, change in color of urine, no urgency or frequency.   flank pain. MS:   joint pain, stiffness, decreased range of motion, back pain. Neuro-     nothing unusual Psych:  change in mood or affect.  depression or anxiety.   memory loss.  OBJ- Physical Exam General- Alert, Oriented, Affect-appropriate, Distress- none acute, + obese Skin- rash-none, lesions- none, excoriation- none Lymphadenopathy- none Head- atraumatic            Eyes- Gross vision intact, PERRLA, conjunctivae and secretions clear            Ears- Hearing, canals-normal            Nose- Clear, no-Septal dev, mucus, polyps, erosion, perforation             Throat- Mallampati II , mucosa clear , drainage- none, tonsils- atrophic Neck- flexible , trachea midline, no stridor , thyroid nl, carotid no bruit Chest - symmetrical excursion , unlabored           Heart/CV- RRR , no murmur , no gallop  , no rub, nl s1 s2                           - JVD- none , edema- none, stasis changes- none, varices- none           Lung- clear to P&A, wheeze- none, cough- none , dullness-none, rub- none           Chest wall-+  pacemaker left Abd-  Br/ Gen/ Rectal- Not done, not indicated Extrem- cyanosis- none, clubbing, none, atrophy- none, strength- nl Neuro- grossly intact to observation    Assessment:

## 2022-08-10 ENCOUNTER — Encounter: Payer: Self-pay | Admitting: Internal Medicine

## 2022-08-10 ENCOUNTER — Ambulatory Visit: Payer: Medicare Other | Admitting: Internal Medicine

## 2022-08-10 ENCOUNTER — Encounter (HOSPITAL_COMMUNITY): Payer: Self-pay | Admitting: *Deleted

## 2022-08-10 VITALS — BP 120/78 | HR 54 | Ht 66.5 in | Wt 231.0 lb

## 2022-08-10 DIAGNOSIS — I272 Pulmonary hypertension, unspecified: Secondary | ICD-10-CM

## 2022-08-10 DIAGNOSIS — I5033 Acute on chronic diastolic (congestive) heart failure: Secondary | ICD-10-CM | POA: Diagnosis not present

## 2022-08-10 DIAGNOSIS — J849 Interstitial pulmonary disease, unspecified: Secondary | ICD-10-CM

## 2022-08-10 DIAGNOSIS — G4733 Obstructive sleep apnea (adult) (pediatric): Secondary | ICD-10-CM

## 2022-08-10 MED ORDER — IPRATROPIUM BROMIDE 0.06 % NA SOLN: NASAL | 12 refills | Status: DC

## 2022-08-10 NOTE — Assessment & Plan Note (Signed)
Benefits from CPAP Plan-continue auto 5-15.  Noted pressure requirement is fairly low (range 5.7-7.5) and it may be appropriate to offer an updated sleep study off of CPAP in the future.

## 2022-08-10 NOTE — Patient Instructions (Signed)
We can continue using CPAP auto 5-15  If you decide you would like to talk with the doctor who can do the [facility] nerve stimulator implant as an alternative to CPAP, we will be happy to refer you.  For the runny nose: I have sent script to try ipratropium nasal spray- you can use it before meals or as needed.  Also try otc nasal saline gel (AYR, NeilMed, store brand), for instance at bedtime or as needed for dry, irritated nose.

## 2022-08-10 NOTE — Assessment & Plan Note (Signed)
She will keep appointment with Dr. Chase Caller

## 2022-08-10 NOTE — Assessment & Plan Note (Signed)
Continues oxygen.

## 2022-08-10 NOTE — Assessment & Plan Note (Signed)
Pacemaker follow-up by cardiology

## 2022-08-20 NOTE — Progress Notes (Unsigned)
Cardiology Office Note:    Date:  08/23/2022   ID:  [name], DOB [birth year], MRN [id number]  PCP:  [name], MD   [place] Providers Cardiologist:  [name], MD Electrophysiologist:  Constance Haw, MD  {  Referring MD: [name], MD    History of Present Illness:    [name] is a 77 y.o. female with a hx of chronic diastolic CHF, CAD s/p BMS RCA in 2004, paroxysmal atrial fibrillation s/p AF ablation X 3 and multiple cardioversions on dofetilide, tachybrady syndrome s/p PPM, OSA, obesity DM II, CKD IIIb who was previously followed by Dr. Acie Fredrickson who now presents to clinic for follow-up.  Per review of the record, the patient underwent RHC in 2018 to evaluate dyspnea which showed. RA mean 13, PA 76/29 (48), PCWP mean 33, LVEDP 21, Fick CO/CI 8.47/3.87, PVR 3 WU. Due to discrepancy in PCWP and LVEDP, CTA heart was recommended to rule out pulmonary vein stenosis given history of PV isolation. Cardiac CTA with no evidence of pulmonary vein stenosis but did show atypical left pulmonary vein drainage into left atrium.  She was hospitalized in 05/2020 for respiratory failure secondary to RSV and bronchitis. Course was complicated by Afib with RV. Tikosyn was continued and metop was added. She returned to clinic and was in [place]. She then underwent a fourth Aflutter ablation and DCCV on 07/07/20.  She was readmitted in 12/2021 for acute hypoxic respiratory failure due to acute on chronic HFpEF and possible ILD/mized WHO group II/III PH. She was diuresed with IV lasix and transitioned to PO torsemide.  Echo EF 60-65%, RV okay, no significant valvular disease. HRCT did show ground glass opacities and air trapping also noted on prior scans. Pulmonary consulted. Some findings on imaging appeared suggestive of chronic hypersensitivity pneumonitis. Outpatient follow-up with pulmonary recommended at discharge. Discharged with supplemental 2L O2.   Had  repeat RHC on 01/04/22 which showed elevated filling pressures. Torsemide was increased to 79m daily.   Was seen 02/24/22 where she was stable from a CV standpoint. Weight had been down to 233lbs. Tolerating medications as prescribed. [place]23 with RAP 4, PAP 50/24 with mean 35, PCWP 16, PA sat 74%, CO 6.4, CI 2.99, PVR 2.97 WU. Recommended for continued medical therapy with torsemide and jardiance.  Was last seen in clinic on 05/2022 where she was stable from a CV perspective.   Today, the patient overall feels well today. Has been on 3L [place] during the day and wears her CPAP with 3L at night. No chest pain, orthopnea, or PND. Has occasional LE edema for which she takes metolazone with resolution. Rare palpitations. Tolerating medications but has some GI upset with metformin. Blood pressure is well controlled.   Past Medical History:  Diagnosis Date   Anticoagulant long-term use    pradaxa   Anxiety    Arthritis    "fingers, lower back" (04/23/2017    CAD (coronary artery disease) [id number]  post PTCA with bare-metal stenting to mid RCA in December 2004      CHF (congestive heart failure) (HCC)    Chronic atrial fibrillation ([place] 06/2007   Tachybradycardia pacemaker   Chronic kidney disease    10% function - ?R, other kidney is compensating     CVA (cerebral vascular accident) ([facility] [id number]  denies residual on 04/23/2017   Depression    Diplopia 06/19/2008   Qualifier: Diagnosis of  By: PRegis BillMD, WStandley Brooking  Dysrhythmia    ATRIAL FIBRILATION   Edema of lower extremity    Hyperlipidemia    Hypertension    Inferior myocardial infarction [facility])    acute inferior wall mi/other medical hx   Myocardial infarction [facility]) [id number]   Obesity    OSA on CPAP    last test- 2010   Pacemaker    Pneumonia 2014   tx. ----  [facility]   Pulmonary hypertension ([place])    moderate pulmonary hypertension by 10/2016 echo and 10/2013 cardiac cath   Shortness of breath    Skin cancer     "cut off right [place]; burned off LLE" (04/23/2017)   Sleep apnea    Spondylolisthesis    TIA (transient ischemic attack) 2008   Unspecified hemorrhoids without mention of complication [id number]   Colonoscopy--Dr. Carlean Purl     Past Surgical History:  Procedure Laterality Date   [place] N/A 09/29/2016   Procedure: Atrial Fibrillation Ablation;  Surgeon: Will Meredith Leeds, MD;  Location: [place] CV LAB;  Service: Cardiovascular;  Laterality: N/A;   ATRIAL FIBRILLATION ABLATION N/A 02/07/2018   Procedure: ATRIAL FIBRILLATION ABLATION;  Surgeon: Constance Haw, MD;  Location: [place] CV LAB;  Service: Cardiovascular;  Laterality: N/A;   ATRIAL FIBRILLATION ABLATION N/A 03/13/2019   Procedure: ATRIAL FIBRILLATION ABLATION;  Surgeon: Constance Haw, MD;  Location: [place] CV LAB;  Service: Cardiovascular;  Laterality: N/A;   ATRIAL FIBRILLATION ABLATION N/A 07/07/2020   Procedure: ATRIAL FIBRILLATION ABLATION;  Surgeon: Constance Haw, MD;  Location: [place] CV LAB;  Service: Cardiovascular;  Laterality: N/A;   BACK SURGERY     CARDIOVERSION N/A 09/12/2017   Procedure: CARDIOVERSION;  Surgeon: Jerline Pain, MD;  Location: [facility] ENDOSCOPY;  Service: Cardiovascular;  Laterality: N/A;   CARDIOVERSION N/A 12/13/2017   Procedure: CARDIOVERSION;  Surgeon: Sanda Klein, MD;  Location: [place] ENDOSCOPY;  Service: Cardiovascular;  Laterality: N/A;   CARDIOVERSION N/A 02/18/2018   Procedure: CARDIOVERSION;  Surgeon: Dorothy Spark, MD;  Location: [facility] ENDOSCOPY;  Service: Cardiovascular;  Laterality: N/A;   CARDIOVERSION N/A 05/20/2018   Procedure: CARDIOVERSION;  Surgeon: [name], MD;  Location: [facility] ENDOSCOPY;  Service: Cardiovascular;  Laterality: N/A;   CARDIOVERSION N/A 04/16/2019   Procedure: CARDIOVERSION;  Surgeon: Donato Heinz, MD;  Location: [place];  Service: Endoscopy;   Laterality: N/A;   CARDIOVERSION N/A 01/22/2020   Procedure: CARDIOVERSION;  Surgeon: Geralynn Rile, MD;  Location: [place];  Service: Cardiovascular;  Laterality: N/A;   CARDIOVERSION N/A 02/18/2020   Procedure: CARDIOVERSION;  Surgeon: Elouise Munroe, MD;  Location: [facility] ENDOSCOPY;  Service: Cardiovascular;  Laterality: N/A;   CATARACT EXTRACTION W/ INTRAOCULAR LENS  IMPLANT, BILATERAL Bilateral 01/15/2017- 03/2017   CHOLECYSTECTOMY     CORONARY ANGIOPLASTY  X 2   CORONARY ANGIOPLASTY WITH [place]; ~ 2007; ?date   "1 stent; replaced stent; not sure when I got the last stent" (04/23/2017)   DOPPLER ECHOCARDIOGRAPHY  2009   ELECTROPHYSIOLOGIC STUDY N/A 03/31/2016   Procedure: Cardioversion;  Surgeon: Evans Lance, MD;  Location: [place] CV LAB;  Service: Cardiovascular;  Laterality: N/A;   ELECTROPHYSIOLOGIC STUDY N/A 08/04/2016   Procedure: Cardioversion;  Surgeon: Evans Lance, MD;  Location: [place] CV LAB;  Service: Cardiovascular;  Laterality: N/A;   INSERT / REPLACE / REMOVE PACEMAKER  06/2007   IR RADIOLOGY PERIPHERAL GUIDED IV START  01/31/2018   IR US GUIDE VASC ACCESS LEFT  01/31/2018   JOINT REPLACEMENT     LAPAROSCOPIC CHOLECYSTECTOMY  1994   LEFT AND RIGHT HEART CATHETERIZATION WITH CORONARY ANGIOGRAM N/A 10/06/2013   Procedure: LEFT AND RIGHT HEART CATHETERIZATION WITH CORONARY ANGIOGRAM;  Surgeon: Troy Sine, MD;  Location: [facility] CATH LAB;  Service: Cardiovascular;  Laterality: N/A;   LEFT OOPHORECTOMY Left ~ [place]  2000s - 04/2015 X 3   L3-4; L4-5; L2-3; Dr Trenton Gammon   RIGHT HEART CATH N/A 01/04/2022   Procedure: RIGHT HEART CATH;  Surgeon: Larey Dresser, MD;  Location: [place] CV LAB;  Service: Cardiovascular;  Laterality: N/A;   RIGHT HEART CATH N/A 05/02/2022   Procedure: RIGHT HEART CATH;  Surgeon: Larey Dresser, MD;  Location: [place] CV LAB;  Service: Cardiovascular;  Laterality: N/A;   RIGHT/LEFT HEART  CATH AND CORONARY ANGIOGRAPHY N/A 05/09/2017   Procedure: RIGHT/LEFT HEART CATH AND CORONARY ANGIOGRAPHY;  Surgeon: Sherren Mocha, MD;  Location: [place] CV LAB;  Service: Cardiovascular;  Laterality: N/A;   SKIN CANCER EXCISION Right    [place]   TEE WITHOUT CARDIOVERSION N/A 09/29/2016   Procedure: TRANSESOPHAGEAL ECHOCARDIOGRAM (TEE);  Surgeon: Jerline Pain, MD;  Location: [facility] ENDOSCOPY;  Service: Cardiovascular;  Laterality: N/A;   TOTAL ABDOMINAL HYSTERECTOMY  1984   "uterus & right ovary"   TOTAL KNEE ARTHROPLASTY Left 04/23/2017   TOTAL KNEE ARTHROPLASTY Left 04/23/2017   Procedure: TOTAL KNEE ARTHROPLASTY;  Surgeon: Frederik Pear, MD;  Location: [place];  Service: Orthopedics;  Laterality: Left;   ULTRASOUND GUIDANCE FOR VASCULAR ACCESS  05/09/2017   Procedure: Ultrasound Guidance For Vascular Access;  Surgeon: Sherren Mocha, MD;  Location: [place] CV LAB;  Service: Cardiovascular;;    Current Medications: Current Meds  Medication Sig   albuterol (VENTOLIN HFA) 108 (90 Base) MCG/ACT inhaler Inhale 2 puffs into the lungs every 4 (four) hours as needed for wheezing or shortness of breath.   apixaban (ELIQUIS) 5 MG TABS tablet Take 1 tablet (5 mg total) by mouth 2 (two) times daily.   atorvastatin (LIPITOR) 40 MG tablet TAKE ONE TABLET BY MOUTH ONCE DAILY   BD PEN NEEDLE NANO 2ND GEN 32G X 4 MM MISC daily.   benzonatate (TESSALON PERLES) 100 MG capsule Take 1 capsule (100 mg total) by mouth 3 (three) times daily as needed for cough.   Cholecalciferol (DIALYVITE VITAMIN D 5000) 125 MCG (5000 UT) capsule Take 5,000 Units by mouth daily.   Continuous Blood Gluc Receiver (DEXCOM G7 RECEIVER) DEVI Use as directed while on insulin   Continuous Blood Gluc Sensor (DEXCOM G7 SENSOR) MISC USE TO check blood glucose AS DIRECTED AND CHANGE sensor every 10 DAYS   diltiazem (CARDIZEM LA) 360 MG 24 hr tablet TAKE ONE TABLET BY MOUTH ONCE DAILY   dofetilide (TIKOSYN) 250 MCG capsule Take 1  capsule (250 mcg total) by mouth 2 (two) times daily. KEEP UPCOMING APPOINTMENT FOR FUTURE REFILLS.   DULoxetine (CYMBALTA) 60 MG capsule TAKE ONE CAPSULE BY MOUTH ONCE DAILY   empagliflozin (JARDIANCE) 25 MG TABS tablet Take 1 tablet (25 mg total) by mouth daily.   fluticasone (FLONASE) 50 MCG/ACT nasal spray Place 1 spray into both nostrils daily as needed for allergies.   insulin degludec (TRESIBA FLEXTOUCH) 100 UNIT/ML FlexTouch Pen Inject 28 units into THE SKIN daily   ipratropium (ATROVENT) 0.06 % nasal spray 1-2 puffs each nostril up to 4 times daily  loratadine (CLARITIN) 10 MG tablet TAKE 1 TABLET BY MOUTH EVERY DAY   Lysine 1000 MG TABS Take 1,000 mg by mouth at bedtime.    metFORMIN (GLUCOPHAGE) 1000 MG tablet TAKE ONE TABLET BY MOUTH TWICE DAILY   metolazone (ZAROXOLYN) 2.5 MG tablet TAKE 1 TABLET BY MOUTH EVERY 4  DAYS AS NEEDED FOR SWELLING  PLEASE KEEP UPCOMING APPOINTMENT FURTHER REFILLS   metoprolol tartrate (LOPRESSOR) 50 MG tablet TAKE ONE TABLET BY MOUTH EVERY MORNING and TAKE TWO TABLETS BY MOUTH EVERY EVENING   Multiple Vitamin (MULTIVITAMIN WITH MINERALS) TABS tablet Take 1 tablet by mouth daily. Centrum   nitroGLYCERIN (NITROSTAT) 0.4 MG SL tablet Place 1 tablet (0.4 mg total) under the tongue every 5 (five) minutes x 3 doses as needed for chest pain.   NON FORMULARY CPAP at bedtime   nystatin-triamcinolone (MYCOLOG II) cream Apply 1 application topically 2 (two) times daily as needed.   omeprazole (PRILOSEC) 20 MG capsule TAKE ONE CAPSULE BY MOUTH BEFORE BREAKFAST and TAKE ONE CAPSULE BY MOUTH EVERYDAY AT BEDTIME   Polyethyl Glycol-Propyl Glycol (SYSTANE) 0.4-0.3 % SOLN Place 1 drop into both eyes daily as needed (dry eyes).   potassium chloride SA (KLOR-CON M) 20 MEQ tablet Take 1 tablet (20 mEq total) by mouth daily.   predniSONE (DELTASONE) 20 MG tablet Take 1 tablet (20 mg total) by mouth 2 (two) times daily with a meal. May raise your blood sugar temporarily    spironolactone (ALDACTONE) 25 MG tablet Take 0.5 tablets (12.5 mg total) by mouth daily.   torsemide (DEMADEX) 20 MG tablet Take 3 tablets (60 mg total) by mouth daily.   valACYclovir (VALTREX) 1000 MG tablet TAKE 2 TABLETS BY MOUTH EVERY 12 HOURS AS NEEDED   Current Facility-Administered Medications for the 08/23/22 encounter (Office Visit) with [name], MD  Medication   sodium chloride flush (NS) 0.9 % injection 3 mL     Allergies:   Hydrocodone-guaifenesin and Adhesive [tape]   Social History   Socioeconomic History   Marital status: Married    Spouse name: Diplomatic Services operational officer   Number of children: 0   Years of education: HS   Highest education level: High school graduate  Occupational History   Occupation: retired    Comment: previously worked [employer]  Tobacco Use   Smoking status: Former    Packs/day: 1.00    Years: 5.00    Total pack years: 5.00    Types: Cigarettes    Start date: 05/11/1978    Quit date: 07/03/1982    Years since quitting: 40.1   Smokeless tobacco: Never  Vaping Use   Vaping Use: Never used  Substance and Sexual Activity   Alcohol use: Yes   Drug use: No   Sexual activity: Not Currently  Other Topics Concern   Not on file  Social History Narrative   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   Married    Originally from [place] of two, high school education   Former smoker 1979-1984 1ppd   Hunting dogs 7    Retired from [place] 2    Social Determinants of Radio broadcast assistant Strain: [place]  (02/08/2022)   Overall Financial Resource Strain (CARDIA)    Difficulty of Paying Living Expenses: Not very hard  Food Insecurity: No Food Insecurity (01/12/2022)   Hunger Vital Sign    Worried About Charity fundraiser in  the Last Year: Never true    [place] in the Last Year: Never true  Transportation Needs: No Transportation Needs (01/12/2022)   PRAPARE -  Hydrologist (Medical): No    Lack of Transportation (Non-Medical): No  Physical Activity: Inactive (01/12/2022)   Exercise Vital Sign    Days of Exercise per Week: 0 days    Minutes of Exercise per Session: 0 min  Stress: No Stress Concern Present (01/12/2022)   [place]    Feeling of Stress : Not at all  Social Connections: [place] (01/12/2022)   Social Connection and Isolation Panel [NHANES]    Frequency of Communication with Friends and Family: More than three times a week    Frequency of Social Gatherings with Friends and Family: More than three times a week    Attends Religious Services: More than 4 times per year    Active Member of [employer] or Organizations: Yes    Attends Music therapist: More than 4 times per year    Marital Status: Married     Family History: The patient's family history includes Arrhythmia in her mother; Colon cancer in her maternal aunt; Dementia in her mother; Diabetes in her mother and paternal grandfather; Heart attack in her brother; Heart disease in her paternal aunt; Hypertension in her mother; Prostate cancer in her maternal grandfather; Suicidality in her father.  ROS:   Please see the history of present illness.    Review of Systems  Constitutional:  Negative for diaphoresis, fever and weight loss.  HENT:  Negative for congestion and tinnitus.   Eyes:  Negative for double vision and discharge.  Respiratory:  Positive for shortness of breath (worse with exertion). Negative for sputum production and stridor. Cough: persisitent. Wheezing: expritory wheeze at the end.  Cardiovascular:  Positive for palpitations and leg swelling. Negative for chest pain, orthopnea, claudication and PND.  Gastrointestinal:  Positive for abdominal pain and diarrhea. Negative for blood in stool, heartburn and melena.  Genitourinary:  Negative for hematuria.   Skin:  Negative for itching and rash.  Neurological:  Negative for dizziness and loss of consciousness.  Psychiatric/Behavioral:  Negative for substance abuse. The patient is not nervous/anxious.      All other systems reviewed and are negative.  EKGs/Labs/Other Studies Reviewed:    The following studies were reviewed today: [place] 05-04-2022: Right Heart Pressures RHC Procedural Findings: Hemodynamics (mmHg) RA mean 4 RV 49/7 PA 50/24, mean 35 PCWP mean 16  Oxygen saturations: PA 74% AO 95%  Cardiac Output (Fick) 6.4  Cardiac Index (Fick) 2.99 PVR 2.97 WU   RHC 01/04/22 RA mean 14 RV 68/18 PA 63/24, mean 45 PCWP mean 25 with v-waves to 35 (prominent) Oxygen saturations: PA 71% AO 92% Cardiac Output (Fick) 7.7  Cardiac Index (Fick) 3.5 PVR 2.6 WU PAPi 2.8  TTE 12/10/21: IMPRESSIONS   1. Left ventricular ejection fraction, by estimation, is 60 to 65%. The  left ventricle has normal function. The left ventricle has no regional  wall motion abnormalities. Left ventricular diastolic parameters are  indeterminate.   2. Right ventricular systolic function is normal. The right ventricular  size is normal.   3. The mitral valve is normal in structure. Trivial mitral valve  regurgitation. No evidence of mitral stenosis.   4. The aortic valve is tricuspid. Aortic valve regurgitation is not  visualized. No aortic stenosis is present.   5.  The inferior vena cava is normal in size with greater than 50%  respiratory variability, suggesting right atrial pressure of 3 mmHg.   EKG:  EKG is personally reviewed. 05/04/2022: EKG not ordered today 04/07/2022: ventricular paced with rate of 60 bpm    Recent Labs: 12/15/2021: Magnesium 2.7 12/26/2021: ALT 46; B Natriuretic Peptide 93.2 04/07/2022: BUN 26; Creatinine, Ser 1.46 05/02/2022: Potassium 3.8; Sodium 142 05/09/2022: Hemoglobin 14.8; Platelets 247.0  Recent Lipid Panel    Component Value Date/Time   CHOL 147 08/23/2021 0935    CHOL 116 10/16/2016 0850   TRIG 206.0 (H) 08/23/2021 0935   HDL 47.20 08/23/2021 0935   HDL 42 10/16/2016 0850   CHOLHDL 3 08/23/2021 0935   VLDL 41.2 (H) 08/23/2021 0935   LDLCALC 68 04/10/2019 0801   LDLCALC 46 10/16/2016 0850   LDLDIRECT 76.0 08/23/2021 0935     Risk Assessment/Calculations:    CHA2DS2-VASc Score = 7   This indicates a 11.2% annual risk of stroke. The patient's score is based upon: CHF History: 1 HTN History: 1 Diabetes History: 1 Stroke History: 0 Vascular Disease History: 1 Age Score: 2 Gender Score: 1        Physical Exam:    VS:  BP 120/72   Pulse 65   Ht 5' 6.5" (1.689 m)   Wt 236 lb 6.4 oz (107.2 kg)   SpO2 97%   BMI 37.58 kg/m     Wt Readings from Last 3 Encounters:  08/23/22 236 lb 6.4 oz (107.2 kg)  08/10/22 231 lb (104.8 kg)  07/10/22 235 lb (106.6 kg)     GEN:  Comfortable, on chronic O2 HEENT: Normal NECK: No JVD; No carotid bruits CARDIAC: RRR, no murmurs.  RESPIRATORY:  End expiratory wheezing throughout ABDOMEN: Soft, non-tender, non-distended MUSCULOSKELETAL:  No edema; No deformity  SKIN: Warm and dry NEUROLOGIC:  Alert and oriented x 3 PSYCHIATRIC:  Normal affect   ASSESSMENT:    1. Chronic diastolic congestive heart failure ([place])   2. Pulmonary hypertension, unspecified ([place])   3. OSA (obstructive sleep apnea)   4. Chronic respiratory failure with hypoxia (HCC)   5. Coronary artery disease involving native coronary artery of native heart without angina pectoris   6. Paroxysmal atrial fibrillation (HCC)      PLAN:    In order of problems listed above:  #Chronic diastolic CHF: - Last TTE [id number] with EF 60-65%, normal RV, unable to assess PASP - RHC 12/2021: RAP 14, PA mean 63mHg, PCWP 272mg, CI 3.55, CO 7.74, PVR 2.6 WU - RHC 05/02/22 with RAP 4, PAP 50/24 with mean 35, PCWP 16, PA sat 74%, CO 6.4, CI 2.99, PVR 2.97 WU.  - Currently compensated and euvolemic on exam with NYHA class III symptoms -  Continue torsemide 6069maily and metolazone 2.5mg35m needed for weight gain - Continue potassium supplementation; takes extra on days she takes metolazone - Continue jardiance 25 mg daily for underlying HF and DM - Continue metoprolol tartrate 50 BID - Continue spiro 12.5 mg daily  -Low Na diet - Monitor weights   #Pulmonary hypertension - Suspect mixed Group II/III in the setting of chronic diastolic HF, ILD, hypersensitivity pneumonitis, OSA/OHS - RHC 12/2021: RAP 14, PA mean 45mm58mPCWP 25mmH57mI 3.55, CO 7.74, PVR 2.6 WU - RHC 05/02/22 with RAP 4, PAP 50/24 with mean 35, PCWP 16, PA sat 74%, CO 6.4, CI 2.99, PVR 2.97 WU.  - TTE 12/2021 with normal RV - Followed by Dr. RamaswChase Caller  Chronic respiratory failure with hypoxia - Suspect multifactorial, combination of chronic diastolic CHF, underlying pulmonary disease, OSA and OHS - Has been on 3L O2  - Follows with pulm as above   #Coronary artery disease - S/p BMS RCA in 2004 - [place] 2018: patent stent RCA, CTO small acute marginal branch with collaterals, LAD and Lcx patent - No chest pain - On atorvastatin 40 daily - No aspirin with anticoagulation   #Atrial fibrillation/flutter - Histoy of Afib ablationx3 and Aflutter ablation in 2022 - Continue Tikosyn 250 mcg BID, Metoprolol tartrate 50 BID and diltiazem 360 mg daily - Continue Eliquis 86m BID - Followed by EP  #DMII: -On insulin, metformin, jardiance -Will discuss with Dr. PRegis Billabout starting mounjaro; happy to arrange for her unless Dr. PRegis Billwishes to prescribe   #Tachybrady syndrome - S/p PPM - Follow-up with EP as scheduled   #OSA - On CPAP   #Possible liver cirrhosis - Nodularity of liver noted on CT abdomen pelvis 02/23 and HR CT 06/23 - Follow-up with PCP as scheduled  #HLD: -Continue lipitor -Check lipids when fasting     Follow up in 6 months      Medication Adjustments/Labs and Tests Ordered: Current medicines are reviewed at length with  the patient today.  Concerns regarding medicines are outlined above.  Orders Placed This Encounter  Procedures   Lipid Profile   Basic metabolic panel   No orders of the defined types were placed in this encounter.   Patient Instructions  Medication Instructions:   Your physician recommends that you continue on your current medications as directed. Please refer to the Current Medication list given to you today.  *If you need a refill on your cardiac medications before your next appointment, please call your pharmacy*   Lab Work:  SOMETIME NEXT WEEK HERE IN THE OFFICE--BMET AND LIPIDS--PLEASE COME FASTING TO THIS LAB APPOINTMENT  If you have labs (blood work) drawn today and your tests are completely normal, you will receive your results only by: [place](if you have MyChart) OR A paper copy in the mail If you have any lab test that is abnormal or we need to change your treatment, we will call you to review the results.    Follow-Up: [facility] and your health needs are our priority.  As part of our continuing mission to provide you with exceptional heart care, we have created designated Provider Care Teams.  These Care Teams include your primary Cardiologist (physician) and Advanced Practice Providers (APPs -  Physician Assistants and Nurse Practitioners) who all work together to provide you with the care you need, when you need it.  We recommend signing up for the patient portal called "MyChart".  Sign up information is provided on this After Visit Summary.  MyChart is used to connect with patients for Virtual Visits (Telemedicine).  Patients are able to view lab/test results, encounter notes, upcoming appointments, etc.  Non-urgent messages can be sent to your provider as well.   To learn more about what you can do with MyChart, go to [email or web address]    Your next appointment:   6 month(s)  Provider:   HFreada Bergeron MD            Signed, HFreada Bergeron MD  08/23/2022 11:21 AM    [place]

## 2022-08-21 ENCOUNTER — Other Ambulatory Visit: Payer: Self-pay | Admitting: Cardiovascular Disease

## 2022-08-21 ENCOUNTER — Other Ambulatory Visit: Payer: Self-pay | Admitting: Internal Medicine

## 2022-08-21 ENCOUNTER — Other Ambulatory Visit: Payer: Self-pay | Admitting: *Deleted

## 2022-08-21 DIAGNOSIS — I480 Paroxysmal atrial fibrillation: Secondary | ICD-10-CM

## 2022-08-21 MED ORDER — APIXABAN 5 MG PO TABS: 5.0000 mg | ORAL_TABLET | Freq: Two times a day (BID) | ORAL | 1 refills | Status: DC

## 2022-08-21 NOTE — Telephone Encounter (Signed)
Eliquis 45m refill request received. Patient is 77years old, weight-104.8kg, Crea-1.46 on 04/07/22, Diagnosis-Afib, and last seen by HGwyndolyn Kaufmanon 05/04/22. Dose is appropriate based on dosing criteria. Will send in refill to requested pharmacy.

## 2022-08-22 ENCOUNTER — Telehealth: Payer: Self-pay

## 2022-08-22 DIAGNOSIS — G4710 Hypersomnia, unspecified: Secondary | ICD-10-CM | POA: Diagnosis not present

## 2022-08-22 DIAGNOSIS — I272 Pulmonary hypertension, unspecified: Secondary | ICD-10-CM | POA: Diagnosis not present

## 2022-08-22 DIAGNOSIS — G4733 Obstructive sleep apnea (adult) (pediatric): Secondary | ICD-10-CM | POA: Diagnosis not present

## 2022-08-22 DIAGNOSIS — I10 Essential (primary) hypertension: Secondary | ICD-10-CM | POA: Diagnosis not present

## 2022-08-22 NOTE — Progress Notes (Signed)
Patient ID: [name], female   DOB: 1946-04-17, 77 y.o.   MRN: [id number]  Care Management & Coordination Services Pharmacy Team  Reason for Encounter: Medication coordination and delivery  Contacted patient to discuss medications and coordinate delivery from [facility]. Spoke with patient on 08/22/2022  Cycle dispensing form sent to [facility] for review.   Last adherence delivery date:08/02/22      Patient is due for next adherence delivery on: 09/01/22  This delivery to include: Adherence Packaging  30 Days  Torsemide 20 mg: Take 3 tabs before breakfast Eliquis 5 mg: Take one tab at Breakfast and one tab at Bedtime Atorvastatin 40 mg: take one tab at Breakfast Diltiazem 360 mg: Take one tab at Breakfast Duloxetine 60 mg: Take one cap at Breakfast Jardiance 25 mg: Take one tab before Breakfast Metformin 1000 mg: Take one tab at Breakfast and one tab at [place] Potassium 20 Meq: Take one at Bedtime Omeprazole 20 mg: Take one cap before Breakfast and one at Bedtime Dofetilide 250 mg: Take one cap at Breakfast and one at Bedtime Spironolactone 25 mg: Take half tab at Breakfast Metoprolol 50 mg: Take one tab at Breakfast and 2 tabs at Bedtime Dexcom G7 Sensors  Tresiba to be re- attempted on today 08/22/22 ** Patient aware    IPRATROPIUM 0.06% SPRAY 08/10/2022 90   Patient declined the following medications this month: Pen needles patient reports she still has plenty  Any concerns about your medications? No  How often do you forget or accidentally miss a dose? Never  Do you use a pillbox? No  Is patient in packaging Yes  The patient denies side effects with their medications.   How often do you forget or accidentally miss a dose? Never  Do you use a pillbox? No  Are you having any problems getting your medications from your pharmacy? Yes, Patient reports that she was expecting a delivery from the pharmacy and they came but did not leave the medication as she was  not home. Upon investigation with the Pharmacy was advised they did not leave her medication as they were unable to reach the patient to confirm that it was ok to charge her card for a copay of 70.00. Advised patient and she expresses understanding, delivery attempt to be made again on today  Has the cost of your medications been a concern? No  The patient has not had an ED visit since last contact.   The patient denies problems with their health.   Patient denies concerns or questions for Theo Dills, PharmD at this time.   Counseled patient on: Great job taking medications and Access to carecoordination team for any cost, medication or pharmacy concerns.  Chart review: Recent office visits:  None   Recent consult visits:  08/10/22 Deneise Lever, MD - Patient presented for Obstructive sleep apnea and other concerns. Prescribed Atrovent.  Hospital visits:  Medication Reconciliation was completed by comparing discharge summary, patient's EMR and Pharmacy list, and upon discussion with patient.   Patient presented to [facility] on 05/02/22 due to Pulmonary Hypertension and Procedure.Patient was present for 4 hours.   New?Medications Started at [facility] Discharge:?? -started  none   Medication Changes at Hospital Discharge: -Changed  none   Medications Discontinued at Hospital Discharge: -Stopped  None   Medications that remain the same after Hospital Discharge:??  -All other medications will remain the same.      Medications: Outpatient Encounter Medications as of 08/22/2022  Medication Sig   albuterol (VENTOLIN HFA) 108 (90 Base) MCG/ACT inhaler Inhale 2 puffs into the lungs every 4 (four) hours as needed for wheezing or shortness of breath.   apixaban (ELIQUIS) 5 MG TABS tablet Take 1 tablet (5 mg total) by mouth 2 (two) times daily.   atorvastatin (LIPITOR) 40 MG tablet TAKE ONE TABLET BY MOUTH ONCE DAILY   BD PEN NEEDLE NANO 2ND GEN 32G X 4 MM MISC  daily.   benzonatate (TESSALON PERLES) 100 MG capsule Take 1 capsule (100 mg total) by mouth 3 (three) times daily as needed for cough.   Cholecalciferol (DIALYVITE VITAMIN D 5000) 125 MCG (5000 UT) capsule Take 5,000 Units by mouth daily.   Continuous Blood Gluc Receiver (DEXCOM G7 RECEIVER) DEVI Use as directed while on insulin   Continuous Blood Gluc Sensor (DEXCOM G7 SENSOR) MISC USE TO check blood glucose AS DIRECTED AND CHANGE sensor every 10 DAYS   diltiazem (CARDIZEM LA) 360 MG 24 hr tablet TAKE ONE TABLET BY MOUTH ONCE DAILY   dofetilide (TIKOSYN) 250 MCG capsule Take 1 capsule (250 mcg total) by mouth 2 (two) times daily. KEEP UPCOMING APPOINTMENT FOR FUTURE REFILLS.   DULoxetine (CYMBALTA) 60 MG capsule TAKE ONE CAPSULE BY MOUTH ONCE DAILY   empagliflozin (JARDIANCE) 25 MG TABS tablet Take 1 tablet (25 mg total) by mouth daily.   fluticasone (FLONASE) 50 MCG/ACT nasal spray Place 1 spray into both nostrils daily as needed for allergies.   insulin degludec (TRESIBA FLEXTOUCH) 100 UNIT/ML FlexTouch Pen Inject 28 units into THE SKIN daily   ipratropium (ATROVENT) 0.06 % nasal spray 1-2 puffs each nostril up to 4 times daily   loratadine (CLARITIN) 10 MG tablet TAKE 1 TABLET BY MOUTH EVERY DAY   Lysine 1000 MG TABS Take 1,000 mg by mouth at bedtime.    metFORMIN (GLUCOPHAGE) 1000 MG tablet TAKE ONE TABLET BY MOUTH TWICE DAILY   metolazone (ZAROXOLYN) 2.5 MG tablet TAKE 1 TABLET BY MOUTH EVERY 4  DAYS AS NEEDED FOR SWELLING  PLEASE KEEP UPCOMING APPOINTMENT FURTHER REFILLS   metoprolol tartrate (LOPRESSOR) 50 MG tablet TAKE ONE TABLET BY MOUTH EVERY MORNING and TAKE TWO TABLETS BY MOUTH EVERY EVENING   Multiple Vitamin (MULTIVITAMIN WITH MINERALS) TABS tablet Take 1 tablet by mouth daily. Centrum   nitroGLYCERIN (NITROSTAT) 0.4 MG SL tablet Place 1 tablet (0.4 mg total) under the tongue every 5 (five) minutes x 3 doses as needed for chest pain.   NON FORMULARY CPAP at bedtime    nystatin-triamcinolone (MYCOLOG II) cream Apply 1 application topically 2 (two) times daily as needed.   omeprazole (PRILOSEC) 20 MG capsule TAKE ONE CAPSULE BY MOUTH BEFORE BREAKFAST and TAKE ONE CAPSULE BY MOUTH EVERYDAY AT BEDTIME   Polyethyl Glycol-Propyl Glycol (SYSTANE) 0.4-0.3 % SOLN Place 1 drop into both eyes daily as needed (dry eyes).   potassium chloride SA (KLOR-CON M) 20 MEQ tablet Take 1 tablet (20 mEq total) by mouth daily.   predniSONE (DELTASONE) 20 MG tablet Take 1 tablet (20 mg total) by mouth 2 (two) times daily with a meal. May raise your blood sugar temporarily   spironolactone (ALDACTONE) 25 MG tablet Take 0.5 tablets (12.5 mg total) by mouth daily.   torsemide (DEMADEX) 20 MG tablet Take 3 tablets (60 mg total) by mouth daily.   valACYclovir (VALTREX) 1000 MG tablet TAKE 2 TABLETS BY MOUTH EVERY 12 HOURS AS NEEDED   Facility-Administered Encounter Medications as of 08/22/2022  Medication   sodium chloride  flush (NS) 0.9 % injection 3 mL   BP Readings from Last 3 Encounters:  08/10/22 120/78  07/10/22 110/70  05/09/22 120/70    Pulse Readings from Last 3 Encounters:  08/10/22 (!) 54  07/10/22 68  05/09/22 64    Lab Results  Component Value Date/Time   HGBA1C 7.0 (A) 04/18/2022 11:19 AM   HGBA1C 7.7 (A) 12/26/2021 03:38 PM   HGBA1C 8.0 (H) 08/23/2021 09:35 AM   HGBA1C 8.2 (H) 04/20/2021 11:30 AM   Lab Results  Component Value Date   CREATININE 1.46 (H) 04/07/2022   BUN 26 04/07/2022   GFR 38.69 (L) 12/28/2021   GFRNONAA 39 (L) 01/16/2022   GFRAA 40 (L) 07/28/2020   NA 142 05/02/2022   K 3.8 05/02/2022   CALCIUM 9.6 04/07/2022   CO2 25 04/07/2022     Care Gaps: Diabetic Urine - Overdue TDAP - Overdue Zoster Vaccine - Overdue Foot Exam - Overdue COVID Booster - [place] Exam - Overdue AWV- 12/2021 Pharmacist appt : April 2024  Star Rating Drugs:  Metformin (Glucophage) 500 mg - Last filled 07/31/22 30 DS  [facility]  Empagliflozin (Jardiance)  25 mg - Last filled 07/31/22 30 DS [facility] Atorvastatin (Lipitor) 40 mg - Last filled 07/31/22 30 DS [facility]         [place] Clinical Pharmacist Assistant [phone]

## 2022-08-23 ENCOUNTER — Ambulatory Visit: Payer: Medicare Other | Attending: Cardiology | Admitting: Cardiology

## 2022-08-23 ENCOUNTER — Encounter: Payer: Self-pay | Admitting: Cardiology

## 2022-08-23 VITALS — BP 120/72 | HR 65 | Ht 66.5 in | Wt 236.4 lb

## 2022-08-23 DIAGNOSIS — G4733 Obstructive sleep apnea (adult) (pediatric): Secondary | ICD-10-CM

## 2022-08-23 DIAGNOSIS — J9611 Chronic respiratory failure with hypoxia: Secondary | ICD-10-CM

## 2022-08-23 DIAGNOSIS — I5032 Chronic diastolic (congestive) heart failure: Secondary | ICD-10-CM

## 2022-08-23 DIAGNOSIS — I2510 Atherosclerotic heart disease of native coronary artery without angina pectoris: Secondary | ICD-10-CM

## 2022-08-23 DIAGNOSIS — I480 Paroxysmal atrial fibrillation: Secondary | ICD-10-CM

## 2022-08-23 DIAGNOSIS — I272 Pulmonary hypertension, unspecified: Secondary | ICD-10-CM

## 2022-08-23 NOTE — Patient Instructions (Signed)
Medication Instructions:   Your physician recommends that you continue on your current medications as directed. Please refer to the Current Medication list given to you today.  *If you need a refill on your cardiac medications before your next appointment, please call your pharmacy*   Lab Work:  SOMETIME NEXT WEEK HERE IN THE OFFICE--BMET AND LIPIDS--PLEASE COME FASTING TO THIS LAB APPOINTMENT  If you have labs (blood work) drawn today and your tests are completely normal, you will receive your results only by: [place] (if you have MyChart) OR A paper copy in the mail If you have any lab test that is abnormal or we need to change your treatment, we will call you to review the results.    Follow-Up: At [facility], you and your health needs are our priority.  As part of our continuing mission to provide you with exceptional heart care, we have created designated Provider Care Teams.  These Care Teams include your primary Cardiologist (physician) and Advanced Practice Providers (APPs -  Physician Assistants and Nurse Practitioners) who all work together to provide you with the care you need, when you need it.  We recommend signing up for the patient portal called "MyChart".  Sign up information is provided on this After Visit Summary.  MyChart is used to connect with patients for Virtual Visits (Telemedicine).  Patients are able to view lab/test results, encounter notes, upcoming appointments, etc.  Non-urgent messages can be sent to your provider as well.   To learn more about what you can do with MyChart, go to [email or web address].    Your next appointment:   6 month(s)  Provider:   [name], MD

## 2022-08-25 ENCOUNTER — Telehealth: Payer: Self-pay | Admitting: Pharmacist

## 2022-08-25 MED ORDER — MOUNJARO 2.5 MG/0.5ML ~~LOC~~ SOAJ: 2.5000 mg | SUBCUTANEOUS | 0 refills | Status: DC

## 2022-08-25 NOTE — Telephone Encounter (Signed)
-----   Message from [name], MD sent at 08/25/2022  9:38 AM EST ----- Regarding: RE: Darcel Bayley Anytime! We will get her started!  Megan or Donya Tomaro, do you mind helping her get initiated on Mounjaro? ----- Message ----- From: [name], MD Sent: 08/24/2022   2:21 PM EST To: [name], MD Subject: RE: Darcel Bayley                                   I am ok with this  may be quicker for ytou to initiate since  I am out next week and tomorrow  and will need a PA . Most likely but I can follow up  Thanks ----- Message ----- From: [name], MD Sent: 08/23/2022  10:25 AM EST To: [name], MD; Brand Males, MD Subject: Clarene Reamer Dr. [name],  I just saw her today. Would you be okay if she started [facility] to help with DMII and weight loss? We are happy to arrange for her unless you would like to do it.  Thank you so much and hope you have a wonderful day!  -[name]

## 2022-08-25 NOTE — Telephone Encounter (Signed)
PA for [facility] submitted Key: [id number]

## 2022-08-25 NOTE — Telephone Encounter (Signed)
PA approved. Spoke with patient who is interested in starting therapy. Reviewed cost and she is ok with it. Rx sent to [facility] and pt scheduled for Monday at 3:30. She is interested in decreasing metformin due to upset stomach. She is also on insulin. This may need to be decreased at some point.

## 2022-08-28 ENCOUNTER — Ambulatory Visit: Payer: Medicare Other

## 2022-08-30 ENCOUNTER — Ambulatory Visit: Payer: Medicare Other | Attending: Cardiology

## 2022-08-30 DIAGNOSIS — J9611 Chronic respiratory failure with hypoxia: Secondary | ICD-10-CM | POA: Diagnosis not present

## 2022-08-30 DIAGNOSIS — I5032 Chronic diastolic (congestive) heart failure: Secondary | ICD-10-CM | POA: Diagnosis not present

## 2022-08-30 DIAGNOSIS — I2510 Atherosclerotic heart disease of native coronary artery without angina pectoris: Secondary | ICD-10-CM | POA: Diagnosis not present

## 2022-08-30 DIAGNOSIS — I272 Pulmonary hypertension, unspecified: Secondary | ICD-10-CM

## 2022-08-30 DIAGNOSIS — G4733 Obstructive sleep apnea (adult) (pediatric): Secondary | ICD-10-CM | POA: Diagnosis not present

## 2022-08-30 DIAGNOSIS — I480 Paroxysmal atrial fibrillation: Secondary | ICD-10-CM

## 2022-08-31 ENCOUNTER — Telehealth: Payer: Self-pay | Admitting: *Deleted

## 2022-08-31 DIAGNOSIS — I2510 Atherosclerotic heart disease of native coronary artery without angina pectoris: Secondary | ICD-10-CM

## 2022-08-31 DIAGNOSIS — Z79899 Other long term (current) drug therapy: Secondary | ICD-10-CM

## 2022-08-31 DIAGNOSIS — E785 Hyperlipidemia, unspecified: Secondary | ICD-10-CM

## 2022-08-31 LAB — LIPID PANEL
Chol/HDL Ratio: 3.4 ratio (ref 0.0–4.4)
Cholesterol, Total: 170 mg/dL (ref 100–199)
HDL: 50 mg/dL (ref >39)
LDL Chol Calc (NIH): 90 mg/dL (ref 0–99)
Triglycerides: 178 mg/dL — ABNORMAL HIGH (ref 0–149)
VLDL Cholesterol Cal: 30 mg/dL (ref 5–40)

## 2022-08-31 LAB — BASIC METABOLIC PANEL
BUN/Creatinine Ratio: 23 (ref 12–28)
BUN: 32 mg/dL — ABNORMAL HIGH (ref 8–27)
CO2: 29 mmol/L (ref 20–29)
Calcium: 10.7 mg/dL — ABNORMAL HIGH (ref 8.7–10.3)
Chloride: 91 mmol/L — ABNORMAL LOW (ref 96–106)
Creatinine, Ser: 1.4 mg/dL — ABNORMAL HIGH (ref 0.57–1.00)
Glucose: 150 mg/dL — ABNORMAL HIGH (ref 70–99)
Potassium: 3.9 mmol/L (ref 3.5–5.2)
Sodium: 141 mmol/L (ref 134–144)
eGFR: 39 mL/min/{1.73_m2} — ABNORMAL LOW (ref >59)

## 2022-08-31 MED ORDER — EZETIMIBE 10 MG PO TABS
10.0000 mg | ORAL_TABLET | Freq: Every day | ORAL | 2 refills | Status: DC
  Filled 2023-02-26: qty 90, 90d supply, fill #0

## 2022-08-31 NOTE — Telephone Encounter (Signed)
-----   Message from [name], MD sent at 08/31/2022  7:39 AM EST ----- Her LDL is above goal at 90. Can we add zetia 56m daily and repeat lipids in 8 weeks. Otherwise, kidney function and electrolytes look stable.

## 2022-08-31 NOTE — Telephone Encounter (Signed)
The patient has been notified of the result and verbalized understanding.  All questions (if any) were answered.  Pt aware to start taking zetia 10 mg po daily and come in for repeat lipids in 8 weeks.   Confirmed the pharmacy of choice with the pt.   Scheduled the pt for repeat lipids in 8 weeks on 10/23/22.  She is aware to come fasting.  Pt verbalized understanding and agrees with this plan.  OFF NOTE:  Pt did want to let our Pharmacist in lipid clinic know that [facility] was never able to get her mounjaro injection.  Pt states she was suppose to get her first injection and come into see them in clinic for teaching on 2/26, but had to cancel this due to [facility] not being able to get the medication.    Pt is requesting our Pharmacist call her back to get her appt with them rescheduled and to send the mounjaro to a different pharmacy that would have this in stock.   Pt is aware that I will route this message to our PharmD team to further review, advise, and follow-up with the pt.   Pt agreed to this plan.

## 2022-08-31 NOTE — Addendum Note (Signed)
Addended by: Marcelle Overlie D on: 08/31/2022 02:13 PM   Modules accepted: Orders

## 2022-09-01 MED ORDER — MOUNJARO 2.5 MG/0.5ML ~~LOC~~ SOAJ: 2.5000 mg | SUBCUTANEOUS | 0 refills | Status: DC

## 2022-09-01 NOTE — Telephone Encounter (Signed)
Called pt to see which pharmacy she wants [facility] rx sent to, she'd like to try [facility]. Rx sent in. She stated she will call us when she's able to pick up med and will have PharmD appt scheduled at that time.

## 2022-09-01 NOTE — Addendum Note (Signed)
Addended by: Ilham Roughton E on: 09/01/2022 11:08 AM   Modules accepted: Orders

## 2022-09-05 DIAGNOSIS — J849 Interstitial pulmonary disease, unspecified: Secondary | ICD-10-CM | POA: Diagnosis not present

## 2022-09-05 DIAGNOSIS — J8410 Pulmonary fibrosis, unspecified: Secondary | ICD-10-CM | POA: Diagnosis not present

## 2022-09-05 DIAGNOSIS — J9601 Acute respiratory failure with hypoxia: Secondary | ICD-10-CM | POA: Diagnosis not present

## 2022-09-06 NOTE — Telephone Encounter (Signed)
Patient was able to get [facility]. She feels comfortable with injection. AM sugars are 140's and post prandial can be up to 180. No adjustment in insulin needed. She will start injections today. Will come in next thursday for lifestyle educations.

## 2022-09-10 ENCOUNTER — Other Ambulatory Visit: Payer: Self-pay | Admitting: Internal Medicine

## 2022-09-14 ENCOUNTER — Ambulatory Visit: Payer: Medicare Other | Attending: Internal Medicine | Admitting: Pharmacist

## 2022-09-14 VITALS — Wt 233.2 lb

## 2022-09-14 DIAGNOSIS — E1169 Type 2 diabetes mellitus with other specified complication: Secondary | ICD-10-CM

## 2022-09-14 DIAGNOSIS — E669 Obesity, unspecified: Secondary | ICD-10-CM | POA: Diagnosis not present

## 2022-09-14 DIAGNOSIS — E785 Hyperlipidemia, unspecified: Secondary | ICD-10-CM | POA: Diagnosis not present

## 2022-09-14 NOTE — Patient Instructions (Signed)
GLP-1 Receptor Agonist Counseling Points This medication reduces your appetite and may make you feel fuller longer.  Stop eating when your body tells you that you are full. This will likely happen sooner than you are used to. Fried/greasy food and sweets may upset your stomach - minimize these as much as possible. Store your medication in the fridge until you are ready to use it. Inject your medication in the fatty tissue of your lower abdominal area (2 inches away from belly button) or upper outer thigh. Rotate injection sites. Common side effects include: nausea, diarrhea/constipation, and heartburn, and are more likely to occur if you overeat. Stop your injection for 7 days prior to surgical procedures requiring anesthesia.  Dosing schedule: - Month 1: Inject 2.'5mg'$  subcutaneously once weekly for 4 weeks - Month 2: Inject '5mg'$  subcutaneously once weekly for 4 weeks - Month 3: Inject 7.'5mg'$  subcutaneously once weekly for 4 weeks - Month 4: Inject '10mg'$  subcutaneously once weekly for 4 weeks - Month 5: Inject 12.'5mg'$  subcutaneously once weekly for 4 weeks - Month 5: Inject '15mg'$  subcutaneously once weekly  Tips for success: Write down the reasons why you want to lose weight and post it in a place where you'll see it often.  Start small and work your way up. Keep in mind that it takes time to achieve goals, and small steps add up.  Any additional movements help to burn calories. Taking the stairs rather than the elevator and parking at the far end of your parking lot are easy ways to start. Brisk walking for at least 30 minutes 4 or more days of the week is an excellent goal to work toward  Understanding what it means to feel full: Did you know that it can take 15 minutes or more for your brain to receive the message that you've eaten? That means that, if you eat less food, but consume it slower, you may still feel satisfied.  Eating a lot of fruits and vegetables can also help you feel  fuller.  Eat off of smaller plates so that moderate portions don't seem too small  Tips for living a healthier life     Building a Healthy and Balanced Diet Make most of your meal vegetables and fruits -  of your plate. Aim for color and variety, and remember that potatoes don't count as vegetables on the Healthy Eating Plate because of their negative impact on blood sugar.  Go for whole grains -  of your plate. Whole and intact grains--whole wheat, barley, wheat berries, quinoa, oats, brown rice, and foods made with them, such as whole wheat pasta--have a milder effect on blood sugar and insulin than white bread, white rice, and other refined grains.  Protein power -  of your plate. Fish, poultry, beans, and nuts are all healthy, versatile protein sources--they can be mixed into salads, and pair well with vegetables on a plate. Limit red meat, and avoid processed meats such as bacon and sausage.  Healthy plant oils - in moderation. Choose healthy vegetable oils like olive, canola, soy, corn, sunflower, peanut, and others, and avoid partially hydrogenated oils, which contain unhealthy trans fats. Remember that low-fat does not mean "healthy."  Drink water, coffee, or tea. Skip sugary drinks, limit milk and dairy products to one to two servings per day, and limit juice to a small glass per day.  Stay active. The red figure running across the [place] is a reminder that staying active is also important in  weight control.  The main message of the Healthy Eating Plate is to focus on diet quality:  The type of carbohydrate in the diet is more important than the amount of carbohydrate in the diet, because some sources of carbohydrate--like vegetables (other than potatoes), fruits, whole grains, and beans--are healthier than others. The Healthy Eating Plate also advises consumers to avoid sugary beverages, a major source of calories--usually with little nutritional  value--in the American diet. The Healthy Eating Plate encourages consumers to use healthy oils, and it does not set a maximum on the percentage of calories people should get each day from healthy sources of fat. In this way, the Healthy Eating Plate recommends the opposite of the low-fat message promoted for decades by the USDA.  DeskDistributor.no  SUGAR  Sugar is a huge problem in the modern day diet. Sugar is a big contributor to heart disease, diabetes, high triglyceride levels, fatty liver disease and obesity. Sugar is hidden in almost all packaged foods/beverages. Added sugar is extra sugar that is added beyond what is naturally found and has no nutritional benefit for your body. The American Heart Association recommends limiting added sugars to no more than 25g for women and 36 grams for men per day. There are many names for sugar including maltose, sucrose (names ending in "ose"), high fructose corn syrup, molasses, cane sugar, corn sweetener, raw sugar, syrup, honey or fruit juice concentrate.   One of the best ways to limit your added sugars is to stop drinking sweetened beverages such as soda, sweet tea, and fruit juice.  There is 65g of added sugars in one 20oz bottle of Coke! That is equal to 7.5 donuts.   Pay attention and read all nutrition facts labels. Below is an examples of a nutrition facts label. The #1 is showing you the total sugars where the # 2 is showing you the added sugars. This one serving has almost the max amount of added sugars per day!   EXERCISE  Exercise is good. We've all heard that. In an ideal world, we would all have time and resources to get plenty of it. When you are active, your heart pumps more efficiently and you will feel better.  Multiple studies show that even walking regularly has benefits that include living a longer life. The American Heart Association recommends 150 minutes per week of exercise (30  minutes per day most days of the week). You can do this in any increment you wish. Nine or more 10-minute walks count. So does an hour-long exercise class. Break the time apart into what will work in your life. Some of the best things you can do include walking briskly, jogging, cycling or swimming laps. Not everyone is ready to "exercise." Sometimes we need to start with just getting active. Here are some easy ways to be more active throughout the day:  Take the stairs instead of the elevator  Go for a 10-15 minute walk during your lunch break (find a friend to make it more enjoyable)  When shopping, park at the back of the parking lot  If you take public transportation, get off one stop early and walk the extra distance  Pace around while making phone calls  Check with your doctor if you aren't sure what your limitations may be. Always remember to drink plenty of water when doing any type of exercise. Don't feel like a failure if you're not getting the 90-150 minutes per week. If you started by being a couch potato,  then just a 10-minute walk each day is a huge improvement. Start with little victories and work your way up.   HEALTHY EATING TIPS              Plan ahead: make a menu of the meals for a week then create a grocery list to go with that menu. Consider meals that easily stretch into a night of leftovers, such as stews or casseroles. Or consider making two of your favorite meal and put one in the freezer for another night. Try a night or two each week that is "meatless" or "no cook" such as salads. When you get home from the grocery store wash and prepare your vegetables and fruits. Then when you need them they are ready to go.   Tips for going to the grocery store:  [employer] store or generic brands  Check the weekly ad from your store on-line or in their in-store flyer  Look at the unit price on the shelf tag to compare/contrast the costs of different items  Buy fruits/vegetables in season   Carrots, bananas and apples are low-cost, naturally healthy items  If meats or frozen vegetables are on sale, buy some extras and put in your freezer  Limit buying prepared or "ready to eat" items, even if they are pre-made salads or fruit snacks  Do not shop when you're hungry  Foods at eye level tend to be more expensive. Look on the high and low shelves for deals.  Consider shopping at the farmer's market for fresh foods in season.  Avoid the cookie and chip aisles (these are expensive, high in calories and low in nutritional value). Shop on the outside of the grocery store.  Healthy food preparations:  If you can't get lean hamburger, be sure to drain the fat when cooking  Steam, saut (in olive oil), grill or bake foods  Experiment with different seasonings to avoid adding salt to your foods. Kosher salt, sea salt and Himalayan salt are all still salt and should be avoided. Try seasoning food with onion, garlic, thyme, rosemary, basil ect. Onion powder or garlic powder is ok. Avoid if it says salt (ie garlic salt).

## 2022-09-14 NOTE — Progress Notes (Signed)
Patient ID: PEGGE NIPPLE                 DOB: May 05, 1946                    MRN: [id number]      HPI: [name] is a 77 y.o. female patient referred to pharmacy clinic by Dr. Johney Frame to initiate weight loss therapy with GLP1-RA. PMH is significant for obesity complicated by chronic medical conditions including chronic diastolic CHF, CAD s/p BMS RCA in 2004, paroxysmal atrial fibrillation s/p AF ablation X 3 and multiple cardioversions on dofetilide, tachybrady syndrome s/p PPM, OSA, obesity DM II, CKD IIIb . Most recent BMI 37.08 (09/14/22), previous BMI 37.59 (08/23/2022).  Patient reported getting Mounjaro on 09/06/2022. Reported feeling comfortable with injection. AM sugars 140s, post-prandial can be up to 180. Starting injections 09/07/2022. No adjustment to insulin - currently on Tresiba 28 units daily.   At visit today, patient reports taking Mounjaro every Wednesday at 2:00 PM. She has taken 2 doses so far and is rotating injection sites. Denies any side effects including upset stomach, diarrhea/constipation, nausea, and dehydration. CGM states 90% in range (70-180). No lows.  Patient reports not eating when she's not hungry, eating smaller portions, and limiting bread. Exercise is limited due to back pain.   Current weight management medications: tirzepatide 2.'5mg'$ /0.14m  Current meds that may affect weight: prednisone, Tresiba   Insurance payor: [employer] Medicare   Diet:  -Breakfast: premier protein, 2 slices whole wheat bread no crest -Lunch: grilled chicken sandwich (half bread)  -Dinner: half servings, tries to stay away from bread, small servings of pasta  -Snacks: some fruits -Drinks: caffeine free diet pepsi (2 bottles), milk and coffee, water seldom   Exercise: very little exercise, housework, limited by back concerns  - does some chair exercises (minimal)  Family History: The patient's family history includes Arrhythmia in her mother; Colon cancer in her maternal aunt; Dementia in  her mother; Diabetes in her mother and paternal grandfather; Heart attack in her brother; Heart disease in her paternal aunt; Hypertension in her mother; Prostate cancer in her maternal grandfather; Suicidality in her father.   Social History: former smoker 1 pack/day for 5 years. Quit date 07/03/1982. Alcohol use. No drug use.   Labs: Lab Results  Component Value Date   HGBA1C 7.0 (A) 04/18/2022    Wt Readings from Last 1 Encounters:  09/14/22 233 lb 3.2 oz (105.8 kg)    BP Readings from Last 1 Encounters:  08/23/22 120/72   Pulse Readings from Last 1 Encounters:  08/23/22 65       Component Value Date/Time   CHOL 170 08/30/2022 0753   TRIG 178 (H) 08/30/2022 0753   HDL 50 08/30/2022 0753   CHOLHDL 3.4 08/30/2022 0753   CHOLHDL 3 08/23/2021 0935   VLDL 41.2 (H) 08/23/2021 0935   LDLCALC 90 08/30/2022 0753   LDLDIRECT 76.0 08/23/2021 0935    Past Medical History:  Diagnosis Date   Anticoagulant long-term use    pradaxa   Anxiety    Arthritis    "fingers, lower back" (04/23/2017    CAD (coronary artery disease) [id number]  post PTCA with bare-metal stenting to mid RCA in December 2004      CHF (congestive heart failure) (HCC)    Chronic atrial fibrillation [place] 06/2007   Tachybradycardia pacemaker   Chronic kidney disease    10% function - ?R, other kidney is compensating  CVA (cerebral vascular accident) [facility]) [phone]   denies residual on 04/23/2017   Depression    Diplopia 06/19/2008   Qualifier: Diagnosis of  By: [name] MD, [name]    Dysrhythmia    ATRIAL FIBRILATION   Edema of lower extremity    Hyperlipidemia    Hypertension    Inferior myocardial infarction [facility])    acute inferior wall mi/other medical hx   Myocardial infarction [facility]) [id number]   Obesity    OSA on CPAP    last test- 2010   Pacemaker    Pneumonia 2014   tx. ----  [facility]   Pulmonary hypertension ([place])    moderate pulmonary hypertension by 10/2016 echo and 10/2013  cardiac cath   Shortness of breath    Skin cancer    "cut off right [place]; burned off LLE" (04/23/2017)   Sleep apnea    Spondylolisthesis    TIA (transient ischemic attack) 2008   Unspecified hemorrhoids without mention of complication [id number]   Colonoscopy--Dr. Carlean Purl     Current Outpatient Medications on File Prior to Visit  Medication Sig Dispense Refill   albuterol (VENTOLIN HFA) 108 (90 Base) MCG/ACT inhaler Inhale 2 puffs into the lungs every 4 (four) hours as needed for wheezing or shortness of breath. 1 each 1   apixaban (ELIQUIS) 5 MG TABS tablet Take 1 tablet (5 mg total) by mouth 2 (two) times daily. 180 tablet 1   atorvastatin (LIPITOR) 40 MG tablet TAKE ONE TABLET BY MOUTH ONCE DAILY 90 tablet 3   BD PEN NEEDLE NANO 2ND GEN 32G X 4 MM MISC daily.     benzonatate (TESSALON PERLES) 100 MG capsule Take 1 capsule (100 mg total) by mouth 3 (three) times daily as needed for cough. 90 capsule 0   Cholecalciferol (DIALYVITE VITAMIN D 5000) 125 MCG (5000 UT) capsule Take 5,000 Units by mouth daily.     Continuous Blood Gluc Receiver (DEXCOM G7 RECEIVER) DEVI Use as directed while on insulin 1 each 3   Continuous Blood Gluc Sensor (DEXCOM G7 SENSOR) MISC USE TO check blood glucose AS DIRECTED AND CHANGE sensor every 10 DAYS 3 each 3   diltiazem (CARDIZEM LA) 360 MG 24 hr tablet TAKE ONE TABLET BY MOUTH ONCE DAILY 90 tablet 3   dofetilide (TIKOSYN) 250 MCG capsule Take 1 capsule (250 mcg total) by mouth 2 (two) times daily. KEEP UPCOMING APPOINTMENT FOR FUTURE REFILLS. 180 capsule 3   DULoxetine (CYMBALTA) 60 MG capsule TAKE ONE CAPSULE BY MOUTH ONCE DAILY 90 capsule 3   empagliflozin (JARDIANCE) 25 MG TABS tablet Take 1 tablet (25 mg total) by mouth daily. 90 tablet 1   ezetimibe (ZETIA) 10 MG tablet Take 1 tablet (10 mg total) by mouth daily. 90 tablet 2   fluticasone (FLONASE) 50 MCG/ACT nasal spray Place 1 spray into both nostrils daily as needed for allergies.     ipratropium  (ATROVENT) 0.06 % nasal spray 1-2 puffs each nostril up to 4 times daily 15 mL 12   loratadine (CLARITIN) 10 MG tablet TAKE 1 TABLET BY MOUTH EVERY DAY 90 tablet 0   Lysine 1000 MG TABS Take 1,000 mg by mouth at bedtime.      metFORMIN (GLUCOPHAGE) 1000 MG tablet TAKE ONE TABLET BY MOUTH TWICE DAILY 180 tablet 3   metolazone (ZAROXOLYN) 2.5 MG tablet TAKE 1 TABLET BY MOUTH EVERY 4  DAYS AS NEEDED FOR SWELLING  PLEASE KEEP UPCOMING APPOINTMENT FURTHER REFILLS 25 tablet 2   metoprolol  tartrate (LOPRESSOR) 50 MG tablet TAKE ONE TABLET BY MOUTH EVERY MORNING and TAKE TWO TABLETS BY MOUTH EVERY EVENING 270 tablet 3   Multiple Vitamin (MULTIVITAMIN WITH MINERALS) TABS tablet Take 1 tablet by mouth daily. Centrum     nitroGLYCERIN (NITROSTAT) 0.4 MG SL tablet Place 1 tablet (0.4 mg total) under the tongue every 5 (five) minutes x 3 doses as needed for chest pain. 15 tablet 6   NON FORMULARY CPAP at bedtime     nystatin-triamcinolone (MYCOLOG II) cream Apply 1 application topically 2 (two) times daily as needed. 30 g 1   omeprazole (PRILOSEC) 20 MG capsule TAKE ONE CAPSULE BY MOUTH BEFORE BREAKFAST and TAKE ONE CAPSULE BY MOUTH EVERYDAY AT BEDTIME 60 capsule 3   Polyethyl Glycol-Propyl Glycol (SYSTANE) 0.4-0.3 % SOLN Place 1 drop into both eyes daily as needed (dry eyes).     potassium chloride SA (KLOR-CON M) 20 MEQ tablet Take 1 tablet (20 mEq total) by mouth daily. 90 tablet 3   predniSONE (DELTASONE) 20 MG tablet Take 1 tablet (20 mg total) by mouth 2 (two) times daily with a meal. May raise your blood sugar temporarily 10 tablet 0   spironolactone (ALDACTONE) 25 MG tablet Take 0.5 tablets (12.5 mg total) by mouth daily. 45 tablet 3   tirzepatide (MOUNJARO) 2.5 MG/0.5ML Pen Inject 2.5 mg into the skin once a week. 2 mL 0   torsemide (DEMADEX) 20 MG tablet Take 3 tablets (60 mg total) by mouth daily. 270 tablet 3   TRESIBA FLEXTOUCH 100 UNIT/ML FlexTouch Pen Inject 28 units into THE SKIN daily 9 mL 3    valACYclovir (VALTREX) 1000 MG tablet TAKE 2 TABLETS BY MOUTH EVERY 12 HOURS AS NEEDED 34 tablet 2   Current Facility-Administered Medications on File Prior to Visit  Medication Dose Route Frequency Provider Last Rate Last Admin   sodium chloride flush (NS) 0.9 % injection 3 mL  3 mL Intravenous Q12H Marylu Lund., NP        Allergies  Allergen Reactions   Hydrocodone-Guaifenesin Other (See Comments)    Confusion/ memory loss   Adhesive [Tape] Rash and Other (See Comments)    Allergic to defibrillation pads.     Assessment/Plan:  1. Weight loss - Will continue Mounjaro 2.'5mg'$ /0.73m. Confirmed patient not pregnant and no personal or family history of medullary thyroid carcinoma (MTC) or Multiple Endocrine Neoplasia syndrome type 2 (MEN 2).   Advised patient on common side effects including nausea, diarrhea, dyspepsia, decreased appetite, and fatigue. Counseled patient on reducing meal size and avoiding fatty/greasy foods to minimize side effects. Counseled patient to call if intolerable side effects or if experiencing dehydration, abdominal pain, or dizziness.   Encouraged [employer], [employer] or [employer] (Leisure [employer] manager for increasing exercise (chair yoga, chair exercises).  Encouraged patient to increase water intake and continue all other dietary modifications.   Follow up in around 1 week.   Thank you,  AWallene Huh PharmD Candidate

## 2022-09-14 NOTE — Assessment & Plan Note (Signed)
Will continue Mounjaro 2.'5mg'$ /0.75m. Confirmed patient not pregnant and no personal or family history of medullary thyroid carcinoma (MTC) or Multiple Endocrine Neoplasia syndrome type 2 (MEN 2).   Advised patient on common side effects including nausea, diarrhea, dyspepsia, decreased appetite, and fatigue. Counseled patient on reducing meal size and avoiding fatty/greasy foods to minimize side effects. Counseled patient to call if intolerable side effects or if experiencing dehydration, abdominal pain, or dizziness.   Encouraged [employer], [employer] or [employer] (Leisure centre manager for increasing exercise (chair yoga, chair exercises).  Encouraged patient to increase water intake and continue all other dietary modifications.   Follow up in around 1 week.

## 2022-09-20 ENCOUNTER — Telehealth: Payer: Self-pay | Admitting: Cardiology

## 2022-09-20 ENCOUNTER — Telehealth: Payer: Self-pay

## 2022-09-20 DIAGNOSIS — G4733 Obstructive sleep apnea (adult) (pediatric): Secondary | ICD-10-CM | POA: Diagnosis not present

## 2022-09-20 DIAGNOSIS — I10 Essential (primary) hypertension: Secondary | ICD-10-CM | POA: Diagnosis not present

## 2022-09-20 DIAGNOSIS — I272 Pulmonary hypertension, unspecified: Secondary | ICD-10-CM | POA: Diagnosis not present

## 2022-09-20 DIAGNOSIS — G4710 Hypersomnia, unspecified: Secondary | ICD-10-CM | POA: Diagnosis not present

## 2022-09-20 NOTE — Telephone Encounter (Signed)
*  STAT* If patient is at the pharmacy, call can be transferred to refill team.   1. Which medications need to be refilled? (please list name of each medication and dose if known)   tirzepatide (MOUNJARO) 2.5 MG/0.5ML Pen  Inject 2.5 mg into the skin once a week.   2. Which pharmacy/location (including street and city if local pharmacy) is medication to be sent to?[facility] - [place], [place] - [place] [address]   3. Do they need a 30 day or 90 day supply? 30 Day Supply

## 2022-09-20 NOTE — Progress Notes (Signed)
Patient ID: [name], female   DOB: [birth year], 77 y.o.   MRN: [id number]  Care Management & Coordination Services Pharmacy Team  Reason for Encounter: Medication coordination and delivery  Contacted patient to discuss medications and coordinate delivery from [facility]. Spoke with patient on 09/20/2022  Cycle dispensing form sent to [facility] for review.   Last adherence delivery date:09/01/22      Patient is due for next adherence delivery on: 10/02/22  This delivery to include: Adherence Packaging  30 Days  Torsemide 20 mg: Take 3 tabs before breakfast Eliquis 5 mg: Take one tab at Breakfast and one tab at Bedtime Atorvastatin 40 mg: take one tab at Breakfast Diltiazem 360 mg: Take one tab at Breakfast Duloxetine 60 mg: Take one cap at Breakfast Jardiance 25 mg: Take one tab before Breakfast Metformin 1000 mg: Take one tab at Breakfast and one tab at [place] Potassium 20 Meq: Take one at Bedtime Omeprazole 20 mg: Take one cap before Breakfast and one at Bedtime Dofetilide 250 mg: Take one cap at Breakfast and one at Bedtime Spironolactone 25 mg: Take half tab at Breakfast Metoprolol 50 mg: Take one tab at Breakfast and 2 tabs at Bedtime Dexcom G7 Sensors Ezetimibe 10 mg : Take one at Breakfast ezetimibe 10 mg tablet 09/05/2022 30   Mounjaro 2.5 mg/0.5 mL subcutaneous pen injector 08/28/2022 30   Any concerns about your medications? No  How often do you forget or accidentally miss a dose? Never  Do you use a pillbox? No  Is patient in packaging Yes   Patient reports she has 1 pen left on her Monjauro/ call to prescriber to request [facility] as [facility]  Chart review: Recent office visits:  None   Recent consult visits:  09/14/22 [name], RPH-CPP - Patient presented for Class 2 Obesity and other concerns. No medication changes.   08/23/22 Samantha Bergeron, MD (Cardiology) - Patient presented for Chronic diastolic  congestive heart failure and other concerns. No medication changes.   Hospital visits:  Medication Reconciliation was completed by comparing discharge summary, patient's EMR and Pharmacy list, and upon discussion with patient.   Patient presented to [facility] on 05/02/22 due to Pulmonary Hypertension and Procedure.Patient was present for 4 hours.   New?Medications Started at [facility] Discharge:?? -started  none   Medication Changes at Hospital Discharge: -Changed  none   Medications Discontinued at Hospital Discharge: -Stopped  None   Medications that remain the same after Hospital Discharge:??  -All other medications will remain the same.    Medications: Outpatient Encounter Medications as of 09/20/2022  Medication Sig   albuterol (VENTOLIN HFA) 108 (90 Base) MCG/ACT inhaler Inhale 2 puffs into the lungs every 4 (four) hours as needed for wheezing or shortness of breath.   apixaban (ELIQUIS) 5 MG TABS tablet Take 1 tablet (5 mg total) by mouth 2 (two) times daily.   atorvastatin (LIPITOR) 40 MG tablet TAKE ONE TABLET BY MOUTH ONCE DAILY   BD PEN NEEDLE NANO 2ND GEN 32G X 4 MM MISC daily.   benzonatate (TESSALON PERLES) 100 MG capsule Take 1 capsule (100 mg total) by mouth 3 (three) times daily as needed for cough.   Cholecalciferol (DIALYVITE VITAMIN D 5000) 125 MCG (5000 UT) capsule Take 5,000 Units by mouth daily.   Continuous Blood Gluc Receiver (DEXCOM G7 RECEIVER) DEVI Use as directed while on insulin   Continuous Blood Gluc Sensor (DEXCOM  G7 SENSOR) MISC USE TO check blood glucose AS DIRECTED AND CHANGE sensor every 10 DAYS   diltiazem (CARDIZEM LA) 360 MG 24 hr tablet TAKE ONE TABLET BY MOUTH ONCE DAILY   dofetilide (TIKOSYN) 250 MCG capsule Take 1 capsule (250 mcg total) by mouth 2 (two) times daily. KEEP UPCOMING APPOINTMENT FOR FUTURE REFILLS.   DULoxetine (CYMBALTA) 60 MG capsule TAKE ONE CAPSULE BY MOUTH ONCE DAILY   empagliflozin (JARDIANCE) 25 MG  TABS tablet Take 1 tablet (25 mg total) by mouth daily.   ezetimibe (ZETIA) 10 MG tablet Take 1 tablet (10 mg total) by mouth daily.   fluticasone (FLONASE) 50 MCG/ACT nasal spray Place 1 spray into both nostrils daily as needed for allergies.   ipratropium (ATROVENT) 0.06 % nasal spray 1-2 puffs each nostril up to 4 times daily   loratadine (CLARITIN) 10 MG tablet TAKE 1 TABLET BY MOUTH EVERY DAY   Lysine 1000 MG TABS Take 1,000 mg by mouth at bedtime.    metFORMIN (GLUCOPHAGE) 1000 MG tablet TAKE ONE TABLET BY MOUTH TWICE DAILY   metolazone (ZAROXOLYN) 2.5 MG tablet TAKE 1 TABLET BY MOUTH EVERY 4  DAYS AS NEEDED FOR SWELLING  PLEASE KEEP UPCOMING APPOINTMENT FURTHER REFILLS   metoprolol tartrate (LOPRESSOR) 50 MG tablet TAKE ONE TABLET BY MOUTH EVERY MORNING and TAKE TWO TABLETS BY MOUTH EVERY EVENING   Multiple Vitamin (MULTIVITAMIN WITH MINERALS) TABS tablet Take 1 tablet by mouth daily. Centrum   nitroGLYCERIN (NITROSTAT) 0.4 MG SL tablet Place 1 tablet (0.4 mg total) under the tongue every 5 (five) minutes x 3 doses as needed for chest pain.   NON FORMULARY CPAP at bedtime   nystatin-triamcinolone (MYCOLOG II) cream Apply 1 application topically 2 (two) times daily as needed.   omeprazole (PRILOSEC) 20 MG capsule TAKE ONE CAPSULE BY MOUTH BEFORE BREAKFAST and TAKE ONE CAPSULE BY MOUTH EVERYDAY AT BEDTIME   Polyethyl Glycol-Propyl Glycol (SYSTANE) 0.4-0.3 % SOLN Place 1 drop into both eyes daily as needed (dry eyes).   potassium chloride SA (KLOR-CON M) 20 MEQ tablet Take 1 tablet (20 mEq total) by mouth daily.   predniSONE (DELTASONE) 20 MG tablet Take 1 tablet (20 mg total) by mouth 2 (two) times daily with a meal. May raise your blood sugar temporarily   spironolactone (ALDACTONE) 25 MG tablet Take 0.5 tablets (12.5 mg total) by mouth daily.   tirzepatide [facility]) 2.5 MG/0.5ML Pen Inject 2.5 mg into the skin once a week.   torsemide (DEMADEX) 20 MG tablet Take 3 tablets (60 mg total) by  mouth daily.   TRESIBA FLEXTOUCH 100 UNIT/ML FlexTouch Pen Inject 28 units into THE SKIN daily   valACYclovir (VALTREX) 1000 MG tablet TAKE 2 TABLETS BY MOUTH EVERY 12 HOURS AS NEEDED   Facility-Administered Encounter Medications as of 09/20/2022  Medication   sodium chloride flush (NS) 0.9 % injection 3 mL   BP Readings from Last 3 Encounters:  08/23/22 120/72  08/10/22 120/78  07/10/22 110/70    Pulse Readings from Last 3 Encounters:  08/23/22 65  08/10/22 (!) 54  07/10/22 68    Lab Results  Component Value Date/Time   HGBA1C 7.0 (A) 04/18/2022 11:19 AM   HGBA1C 7.7 (A) 12/26/2021 03:38 PM   HGBA1C 8.0 (H) 08/23/2021 09:35 AM   HGBA1C 8.2 (H) 04/20/2021 11:30 AM   Lab Results  Component Value Date   CREATININE 1.40 (H) 08/30/2022   BUN 32 (H) 08/30/2022   GFR 38.69 (L) 12/28/2021   GFRNONAA  39 (L) 01/16/2022   GFRAA 40 (L) 07/28/2020   NA 141 08/30/2022   K 3.9 08/30/2022   CALCIUM 10.7 (H) 08/30/2022   CO2 29 08/30/2022     [place] Clinical Pharmacist Assistant [phone]

## 2022-09-21 MED ORDER — MOUNJARO 5 MG/0.5ML ~~LOC~~ SOAJ: 5.0000 mg | SUBCUTANEOUS | 0 refills | Status: DC

## 2022-09-21 NOTE — Telephone Encounter (Signed)
Patient doing well on Mounjaro 2.5mg . Will increase to 5mg . Rx sent to [facility].

## 2022-10-06 DIAGNOSIS — J8410 Pulmonary fibrosis, unspecified: Secondary | ICD-10-CM | POA: Diagnosis not present

## 2022-10-06 DIAGNOSIS — J849 Interstitial pulmonary disease, unspecified: Secondary | ICD-10-CM | POA: Diagnosis not present

## 2022-10-06 DIAGNOSIS — J9601 Acute respiratory failure with hypoxia: Secondary | ICD-10-CM | POA: Diagnosis not present

## 2022-10-13 MED ORDER — MOUNJARO 5 MG/0.5ML ~~LOC~~ SOAJ: 5.0000 mg | SUBCUTANEOUS | 0 refills | Status: DC

## 2022-10-13 NOTE — Telephone Encounter (Signed)
Spoke to patient who states she is doing well on Mounjaro 5mg  weekly. She does not have any side effects. Doing well with her diet. Has not started exercise yet. Encouraged her to do this. She is going on vacation next week and would like to stay at 5mg  for another month. Rx sent in.

## 2022-10-13 NOTE — Addendum Note (Signed)
Addended by: Malena Peer D on: 10/13/2022 12:58 PM   Modules accepted: Orders

## 2022-10-16 ENCOUNTER — Ambulatory Visit
Admission: RE | Admit: 2022-10-16 | Discharge: 2022-10-16 | Disposition: A | Discharge status: Home / Self Care | Payer: Medicare Other | Source: Ambulatory Visit | Attending: Internal Medicine | Admitting: Internal Medicine

## 2022-10-16 DIAGNOSIS — I700 Atherosclerosis of aorta: Secondary | ICD-10-CM | POA: Diagnosis not present

## 2022-10-16 DIAGNOSIS — J9611 Chronic respiratory failure with hypoxia: Secondary | ICD-10-CM

## 2022-10-16 DIAGNOSIS — J84112 Idiopathic pulmonary fibrosis: Secondary | ICD-10-CM | POA: Diagnosis not present

## 2022-10-16 DIAGNOSIS — J9610 Chronic respiratory failure, unspecified whether with hypoxia or hypercapnia: Secondary | ICD-10-CM | POA: Diagnosis not present

## 2022-10-16 DIAGNOSIS — R918 Other nonspecific abnormal finding of lung field: Secondary | ICD-10-CM | POA: Diagnosis not present

## 2022-10-20 ENCOUNTER — Telehealth: Payer: Self-pay

## 2022-10-20 NOTE — Progress Notes (Signed)
Care Management & Coordination Services Pharmacy Note  10/30/2022 Name:  [name] MRN:  [id number] DOB:  08-29-1945  Summary: A1C at goal <7 BP <130/80, denies any fluid retention signs  Recommendations/Changes made from today's visit: -Continue to check blood glucose daily and be mindful of carbohydrate intake -Recommend r/s eye exam appt that was cancelled at beginning of year -Counseled to check weight daily and keep a log, watch for >3lbs in 1 day or >5lbs gained in 1 week, follow cardio instructions for fluid pill  Follow up plan: BP call in 3 months Pharmacist visit in 6-7 months   Subjective: [name] is an 77 y.o. year old female who is a primary patient of [name], Neta Mends, MD.  The care coordination team was consulted for assistance with disease management and care coordination needs.    Engaged with patient by telephone for follow up visit.  Recent office visits: None  Recent consult visits: 09/14/22 Olene Floss, RPH-CPP - Patient presented for Class 2 Obesity and other concerns. No medication changes.    08/23/22 Meriam Sprague, MD (Cardiology) - Patient presented for Chronic diastolic congestive heart failure and other concerns. No medication changes.   08/10/22 Waymon Budge, MD - Patient presented for Obstructive sleep apnea and other concerns. Prescribed Atrovent.   07/10/22 Kalman Shan, MD (Pulmon) - Patient presented for Chronic respiratory failure with hypoxia and other concerns. No medication changes.     Hospital visits: None in previous 6 months   Objective:  Lab Results  Component Value Date   CREATININE 1.40 (H) 08/30/2022   BUN 32 (H) 08/30/2022   GFR 38.69 (L) 12/28/2021   EGFR 39 (L) 08/30/2022   GFRNONAA 39 (L) 01/16/2022   GFRAA 40 (L) 07/28/2020   NA 141 08/30/2022   K 3.9 08/30/2022   CALCIUM 10.7 (H) 08/30/2022   CO2 29 08/30/2022   GLUCOSE 150 (H) 08/30/2022    Lab Results  Component Value Date/Time    HGBA1C 7.0 (A) 04/18/2022 11:19 AM   HGBA1C 7.7 (A) 12/26/2021 03:38 PM   HGBA1C 8.0 (H) 08/23/2021 09:35 AM   HGBA1C 8.2 (H) 04/20/2021 11:30 AM   GFR 38.69 (L) 12/28/2021 09:04 AM   GFR 42.98 (L) 08/23/2021 09:35 AM   MICROALBUR <0.7 01/23/2017 10:51 AM    Last diabetic Eye exam:  Lab Results  Component Value Date/Time   HMDIABEYEEXA No Retinopathy 07/05/2021 03:22 PM    Last diabetic Foot exam: No results found for: "HMDIABFOOTEX"   Lab Results  Component Value Date   CHOL 104 10/23/2022   HDL 33 (L) 10/23/2022   LDLCALC 42 10/23/2022   LDLDIRECT 76.0 08/23/2021   TRIG 173 (H) 10/23/2022   CHOLHDL 3.2 10/23/2022       Latest Ref Rng & Units 12/26/2021    9:56 AM 12/11/2021    3:19 AM 12/10/2021   12:22 AM  Hepatic Function  Total Protein 6.5 - 8.1 g/dL 7.5  6.9  6.9   Albumin 3.5 - 5.0 g/dL 3.9  3.4  3.6   AST 15 - 41 U/L 36  28  73   ALT 0 - 44 U/L 46  47  65   Alk Phosphatase 38 - 126 U/L 83  84  88   Total Bilirubin 0.3 - 1.2 mg/dL 0.7  1.1  1.1     Lab Results  Component Value Date/Time   TSH 4.61 08/23/2021 09:35 AM   TSH 4.37 04/20/2021 11:30 AM  FREET4 0.86 04/20/2021 11:30 AM   FREET4 0.83 05/06/2018 09:23 AM       Latest Ref Rng & Units 05/09/2022   11:29 AM 05/02/2022    9:42 AM 05/02/2022    9:41 AM  CBC  WBC 4.0 - 10.5 K/uL 11.4     Hemoglobin 12.0 - 15.0 g/dL 40.9  81.1  91.4   Hematocrit 36.0 - 46.0 % 45.4  42.0  42.0   Platelets 150.0 - 400.0 K/uL 247.0       Lab Results  Component Value Date/Time   VD25OH 79.26 12/28/2021 09:04 AM   VD25OH 32.83 01/23/2017 10:51 AM    Clinical ASCVD: Yes  The ASCVD Risk score (Arnett DK, et al., 2019) failed to calculate for the following reasons:   The patient has a prior MI or stroke diagnosis    DEXA 06/04/21 - Normal BMD     04/18/2022   12:19 PM 04/11/2022    1:20 PM 01/12/2022    9:50 AM  Depression screen PHQ 2/9  Decreased Interest 0 0 0  Down, Depressed, Hopeless 1 0 0  PHQ - 2  Score 1 0 0  Altered sleeping 0 0 0  Tired, decreased energy 1 0 0  Change in appetite 1 0 0  Feeling bad or failure about yourself  0 0 0  Trouble concentrating 0 0 0  Moving slowly or fidgety/restless 0 0 0  Suicidal thoughts 0 0 0  PHQ-9 Score 3 0 0  Difficult doing work/chores Not difficult at all  Not difficult at all     Social History   Tobacco Use  Smoking Status Former   Packs/day: 1.00   Years: 5.00   Additional pack years: 0.00   Total pack years: 5.00   Types: Cigarettes   Start date: 05/11/1978   Quit date: 07/03/1982   Years since quitting: 40.3  Smokeless Tobacco Never   BP Readings from Last 3 Encounters:  08/23/22 120/72  08/10/22 120/78  07/10/22 110/70   Pulse Readings from Last 3 Encounters:  08/23/22 65  08/10/22 (!) 54  07/10/22 68   Wt Readings from Last 3 Encounters:  09/14/22 233 lb 3.2 oz (105.8 kg)  08/23/22 236 lb 6.4 oz (107.2 kg)  08/10/22 231 lb (104.8 kg)   BMI Readings from Last 3 Encounters:  09/14/22 37.08 kg/m  08/23/22 37.58 kg/m  08/10/22 36.73 kg/m    Allergies  Allergen Reactions   Hydrocodone-Guaifenesin Other (See Comments)    Confusion/ memory loss   Adhesive [Tape] Rash and Other (See Comments)    Allergic to defibrillation pads.    Medications Reviewed Today     Reviewed by Sherrill Raring, RPH (Pharmacist) on 10/30/22 at 1309  Med List Status: <None>   Medication Order Taking? Sig Documenting Provider Last Dose Status Informant  albuterol (VENTOLIN HFA) 108 (90 Base) MCG/ACT inhaler [id number] No Inhale 2 puffs into the lungs every 4 (four) hours as needed for wheezing or shortness of breath. [name], Neta Mends, MD Taking Active   apixaban (ELIQUIS) 5 MG TABS tablet [id number] No Take 1 tablet (5 mg total) by mouth 2 (two) times daily. Meriam Sprague, MD Taking Active   atorvastatin (LIPITOR) 40 MG tablet [id number] No TAKE ONE TABLET BY MOUTH ONCE DAILY Meriam Sprague, MD Taking Active   BD PEN  NEEDLE NANO 2ND GEN 32G X 4 MM MISC [id number] No daily. [provider] Taking Active   benzonatate (TESSALON PERLES) 100 MG capsule  [id number] No Take 1 capsule (100 mg total) by mouth 3 (three) times daily as needed for cough. Meriam Sprague, MD Taking Active   Cholecalciferol (DIALYVITE VITAMIN D 5000) 125 MCG (5000 UT) capsule [id number] No Take 5,000 Units by mouth daily. [provider] Taking Active Self  Continuous Blood Gluc Receiver (DEXCOM G7 [place]) DEVI [id number] No Use as directed while on insulin [name], Neta Mends, MD Taking Active Self  Continuous Blood Gluc Sensor (DEXCOM G7 SENSOR) MISC [id number] No USE TO check blood glucose AS DIRECTED AND CHANGE sensor every 10 DAYS Worthy Rancher B, FNP Taking Active   diltiazem (CARDIZEM LA) 360 MG 24 hr tablet [id number] No TAKE ONE TABLET BY MOUTH ONCE DAILY Meriam Sprague, MD Taking Active   dofetilide (TIKOSYN) 250 MCG capsule [id number] No Take 1 capsule (250 mcg total) by mouth 2 (two) times daily. KEEP UPCOMING APPOINTMENT FOR FUTURE REFILLS. Nahser, Deloris Ping, MD Taking Active Self  DULoxetine (CYMBALTA) 60 MG capsule [id number] No TAKE ONE CAPSULE BY MOUTH ONCE DAILY Worthy Rancher B, FNP Taking Active   empagliflozin (JARDIANCE) 25 MG TABS tablet [id number] No Take 1 tablet (25 mg total) by mouth daily. [name], Neta Mends, MD Taking Active   ezetimibe (ZETIA) 10 MG tablet [id number]  Take 1 tablet (10 mg total) by mouth daily. Meriam Sprague, MD  Active   fluticasone [name]) 50 MCG/ACT nasal spray [id number] No Place 1 spray into both nostrils daily as needed for allergies. [provider] Taking Active Self  ipratropium (ATROVENT) 0.06 % nasal spray [id number] No 1-2 puffs each nostril up to 4 times daily Jetty Duhamel D, MD Taking Active   loratadine (CLARITIN) 10 MG tablet [id number] No TAKE 1 TABLET BY MOUTH EVERY DAY [name], Neta Mends, MD Taking Active Self  Lysine 1000 MG TABS [id number] No Take 1,000  mg by mouth at bedtime.  [provider] Taking Active Self  metFORMIN (GLUCOPHAGE) 1000 MG tablet [id number] No TAKE ONE TABLET BY MOUTH TWICE DAILY Worthy Rancher B, FNP Taking Active   metolazone (ZAROXOLYN) 2.5 MG tablet [id number] No TAKE 1 TABLET BY MOUTH EVERY 4  DAYS AS NEEDED FOR SWELLING  PLEASE KEEP UPCOMING APPOINTMENT FURTHER REFILLS Nahser, Deloris Ping, MD Taking Active Self  metoprolol tartrate (LOPRESSOR) 50 MG tablet [id number] No TAKE ONE TABLET BY MOUTH EVERY MORNING and TAKE TWO TABLETS BY MOUTH EVERY EVENING Meriam Sprague, MD Taking Active   Multiple Vitamin (MULTIVITAMIN WITH MINERALS) TABS tablet [id number] No Take 1 tablet by mouth daily. Centrum [provider] Taking Active Self  nitroGLYCERIN (NITROSTAT) 0.4 MG SL tablet [id number] No Place 1 tablet (0.4 mg total) under the tongue every 5 (five) minutes x 3 doses as needed for chest pain. Meriam Sprague, MD Taking Active   [facility] [id number] No CPAP at bedtime [provider] Taking Active Self  nystatin-triamcinolone (MYCOLOG II) cream [id number] No Apply 1 application topically 2 (two) times daily as needed. [name], Neta Mends, MD Taking Active Self  omeprazole (PRILOSEC) 20 MG capsule [id number] No TAKE ONE CAPSULE BY MOUTH BEFORE BREAKFAST and TAKE ONE CAPSULE BY MOUTH EVERYDAY AT BEDTIME Worthy Rancher B, FNP Taking Active   Polyethyl Glycol-Propyl Glycol (SYSTANE) 0.4-0.3 % SOLN [id number] No Place 1 drop into both eyes daily as needed (dry eyes). [provider] Taking Active Self  potassium chloride SA (KLOR-CON M) 20 MEQ tablet [id number] No Take 1 tablet (20 mEq total) by mouth daily. Meriam Sprague, MD Taking Active  predniSONE (DELTASONE) 20 MG tablet [id number] No Take 1 tablet (20 mg total) by mouth 2 (two) times daily with a meal. May raise your blood sugar temporarily [name], Neta Mends, MD Taking Active   sodium chloride flush (NS) 0.9 % injection 3 mL [id number]   Gaston Islam., NP  Active   spironolactone (ALDACTONE) 25 MG tablet [id number] No Take 0.5 tablets (12.5 mg total) by mouth daily. Meriam Sprague, MD Taking Active Self  tirzepatide [facility]) 5 MG/0.5ML Pen [id number]  Inject 5 mg into the skin once a week. Meriam Sprague, MD  Active   torsemide (DEMADEX) 20 MG tablet [id number] No Take 3 tablets (60 mg total) by mouth daily. Meriam Sprague, MD Taking Active   TRESIBA FLEXTOUCH 100 UNIT/ML FlexTouch Pen [id number]  Inject 28 units into THE SKIN daily Worthy Rancher B, FNP  Active   valACYclovir (VALTREX) 1000 MG tablet [id number] No TAKE 2 TABLETS BY MOUTH EVERY 12 HOURS AS NEEDED [name], Neta Mends, MD Taking Active Self            SDOH:  (Social Determinants of Health) assessments and interventions performed: Yes SDOH Interventions    Flowsheet Row Care Coordination from 10/30/2022 in [facility] Chronic Care Management from 02/07/2022 in [facility] [place] Chronic Care Management from 01/17/2022 in [facility] at [place] Clinical Support from 01/12/2022 in [facility] [place] ED to Hosp-Admission (Discharged) from 12/10/2021 in [facility] 3E HF PCU Chronic Care Management from 11/17/2019 in [facility] at [place]  SDOH Interventions        Food Insecurity Interventions Intervention Not Indicated -- -- Intervention Not Indicated Intervention Not Indicated --  Housing Interventions Intervention Not Indicated -- -- Intervention Not Indicated Intervention Not Indicated --  Transportation Interventions -- -- -- Intervention Not Indicated Intervention Not Indicated Intervention Not Indicated  Financial Strain Interventions -- Intervention Not Indicated Intervention Not Indicated, Other (Comment)  [patient denied patient assistance] Intervention Not Indicated Intervention Not Indicated --  Physical Activity Interventions -- -- --  Intervention Not Indicated -- --  Stress Interventions -- -- -- Intervention Not Indicated -- --  Social Connections Interventions -- -- -- Intervention Not Indicated -- --       Medication Assistance: None required.  Patient affirms current coverage meets needs.  Medication Access: Within the past 30 days, how often has patient missed a dose of medication? None Is a pillbox or other method used to improve adherence? Yes  Factors that may affect medication adherence? no barriers identified Are meds synced by current pharmacy? Yes  Are meds delivered by current pharmacy? Yes  Does patient experience delays in picking up medications due to transportation concerns? No   [facility] Reviewed: Is patient disadvantaged to use [facility]?: No  Current Rx insurance plan: [facility] Name and location of Current pharmacy:  [facility] - [place], [place] - [address] [place] [address] Phone: [phone] Fax: [phone]  [id number] [place], [place] - [address] AT [facility] OF [facility] [address] [place] [address] Phone: [phone] Fax: [phone]  [facility] services reviewed with patient today?: No  Patient requests to transfer care to [facility]?: No  Reason patient declined to change pharmacies: Patient is already actively enrolled with [facility]  Compliance/Adherence/Medication fill history: Care Gaps: AWV - completed  01/12/2022 Last eye exam - 07/05/2021 Last foot exam - 01/28/2020 Last BP - 120/72 on 08/23/2022 Last A1C - 7.0 on 04/18/2022 Urine ACR - overdue Tdap - overdue Covid - overdue  [facility]: Metformin 1000 mg - Last filled 09/27/2022 30 DS [facility]  Empagliflozin 25 mg - Last filled 09/27/2022 30 [facility] Atorvastatin 40 mg - Last filled 09/27/2022 30 [facility]    Assessment/Plan   Diabetes (A1c goal  <7%) -Controlled -Current medications: Jardiance 25mg  1qd Appropriate, Effective, Safe, Accessible Metformin 1000mg  1 BID Appropriate, Effective, Safe, Accessible Mounjaro 5mg  once weekly Appropriate, Effective, Safe, Accessible Tresiba 28 units into the skin daily Appropriate, Effective, Safe, Accessible -Medications previously tried: None  -Current home glucose readings Checks at least once daily, denies any sugars outside of range -Denies hypoglycemic/hyperglycemic symptoms -Current exercise: Not discussed -Educated on A1c and blood sugar goals; Benefits of weight loss; Proper insulin injection technique; -Counseled to check feet daily and get yearly eye exams -Recommended to continue current medication Recommended rescheduling eye exam for this year that was cancelled  Heart Failure (Goal: manage symptoms and prevent exacerbations) -Controlled -Last ejection fraction: 60-65% (Date: 12/10/21) -HF type: HFpEF (EF > 50%) -NYHA Class: III (marked limitation of activity) -AHA HF Stage: C (Heart disease and symptoms present) -Current treatment: Metolazone 2.5mg  4 days prn swelling Appropriate, Effective, Safe, Accessible Klor-Con 20 mEq 1 qd Appropriate, Effective, Safe, Accessible Spironolactone 25mg  1/2 tab qd Appropriate, Effective, Safe, Accessible Metoprolol Tartrate 50mg  1qam and 2 qpm Appropriate, Effective, Safe, Accessible Jardiance 25mg  1qd Appropriate, Effective, Safe, Accessible Torsemide 20mg  3 tabs daily Appropriate, Effective, Safe, Accessible -Medications previously tried: Losartan  -Current home BP/HR readings: once a week -Current home daily weights: checks daily and denies any gains, is losing currently with mounjaro use -Current dietary habits: is attending weight loss clinic and following their nutrition recommendations -Current exercise habits: not discussed -Educated on Benefits of medications for managing symptoms and prolonging life Proper diuretic  administration and potassium supplementation Importance of blood pressure control -Recommended to continue current medication  Sherrill Raring Clinical Pharmacist [phone]

## 2022-10-20 NOTE — Progress Notes (Signed)
Patient ID: [name], female   DOB: 21-Jul-1945, 77 y.o.   MRN: [id number]   Care Management & Coordination Services Pharmacy Team  Reason for Encounter: Medication coordination and delivery  Contacted patient to discuss medications and coordinate delivery from [facility]. Spoke with patient on 10/20/2022  Cycle dispensing form sent to [facility] for review.   Last adherence delivery date:10/02/22       Patient is due for next adherence delivery on: 11/01/22  This delivery to include: Adherence Packaging  30 Days  Torsemide 20 mg: Take 3 tabs before breakfast Eliquis 5 mg: Take one tab at Breakfast and one tab at Bedtime Atorvastatin 40 mg: take one tab at Breakfast Diltiazem 360 mg: Take one tab at Breakfast Duloxetine 60 mg: Take one cap at Breakfast Jardiance 25 mg: Take one tab before Breakfast Metformin 1000 mg: Take one tab at Breakfast and one tab at Dinner Potassium 20 Meq: Take one at Bedtime Omeprazole 20 mg: Take one cap before Breakfast and one at Bedtime Dofetilide 250 mg: Take one cap at Breakfast and one at Bedtime Spironolactone 25 mg: Take half tab at Breakfast Metoprolol 50 mg: Take one tab at Breakfast and 2 tabs at Bedtime Dexcom G7 Sensors Ezetimibe 10 mg : Take one at Breakfast Mounjaro 5 mg Pen Tresiba Flex Inj 100 u    Any concerns about your medications? Yes Patient reports she thinks she has been having a reaction with the [employer], she reports she has some itchy red spots on her legs that she didn't have prior, is to see the prescriber next week about this and will let me know if she Is instructed to stop the medication so it may be taken off of her order.  How often do you forget or accidentally miss a dose? Never  Do you use a pillbox? No  Is patient in packaging Yes    Chart review: Recent office visits:  None  Recent consult visits:  10/16/22  - Patient presented to [facility] imaging for GI CT.  Hospital visits:  Medication  Reconciliation was completed by comparing discharge summary, patient's EMR and Pharmacy list, and upon discussion with patient.   Patient presented to [facility] on 05/02/22 due to Pulmonary Hypertension and Procedure.Patient was present for 4 hours.   New?Medications Started at [facility] Discharge:?? -started  none   Medication Changes at Hospital Discharge: -Changed  none   Medications Discontinued at Hospital Discharge: -Stopped  None   Medications that remain the same after Hospital Discharge:??  -All other medications will remain the same.    Medications: Outpatient Encounter Medications as of 10/20/2022  Medication Sig   albuterol (VENTOLIN HFA) 108 (90 Base) MCG/ACT inhaler Inhale 2 puffs into the lungs every 4 (four) hours as needed for wheezing or shortness of breath.   apixaban (ELIQUIS) 5 MG TABS tablet Take 1 tablet (5 mg total) by mouth 2 (two) times daily.   atorvastatin (LIPITOR) 40 MG tablet TAKE ONE TABLET BY MOUTH ONCE DAILY   BD PEN NEEDLE NANO 2ND GEN 32G X 4 MM MISC daily.   benzonatate (TESSALON PERLES) 100 MG capsule Take 1 capsule (100 mg total) by mouth 3 (three) times daily as needed for cough.   Cholecalciferol (DIALYVITE VITAMIN D 5000) 125 MCG (5000 UT) capsule Take 5,000 Units by mouth daily.   Continuous Blood Gluc Receiver (DEXCOM G7 RECEIVER) DEVI Use as directed while on insulin   Continuous Blood Gluc Sensor (DEXCOM G7 SENSOR) MISC  USE TO check blood glucose AS DIRECTED AND CHANGE sensor every 10 DAYS   diltiazem (CARDIZEM LA) 360 MG 24 hr tablet TAKE ONE TABLET BY MOUTH ONCE DAILY   dofetilide (TIKOSYN) 250 MCG capsule Take 1 capsule (250 mcg total) by mouth 2 (two) times daily. KEEP UPCOMING APPOINTMENT FOR FUTURE REFILLS.   DULoxetine (CYMBALTA) 60 MG capsule TAKE ONE CAPSULE BY MOUTH ONCE DAILY   empagliflozin (JARDIANCE) 25 MG TABS tablet Take 1 tablet (25 mg total) by mouth daily.   ezetimibe (ZETIA) 10 MG tablet Take 1 tablet  (10 mg total) by mouth daily.   fluticasone (FLONASE) 50 MCG/ACT nasal spray Place 1 spray into both nostrils daily as needed for allergies.   ipratropium (ATROVENT) 0.06 % nasal spray 1-2 puffs each nostril up to 4 times daily   loratadine (CLARITIN) 10 MG tablet TAKE 1 TABLET BY MOUTH EVERY DAY   Lysine 1000 MG TABS Take 1,000 mg by mouth at bedtime.    metFORMIN (GLUCOPHAGE) 1000 MG tablet TAKE ONE TABLET BY MOUTH TWICE DAILY   metolazone (ZAROXOLYN) 2.5 MG tablet TAKE 1 TABLET BY MOUTH EVERY 4  DAYS AS NEEDED FOR SWELLING  PLEASE KEEP UPCOMING APPOINTMENT FURTHER REFILLS   metoprolol tartrate (LOPRESSOR) 50 MG tablet TAKE ONE TABLET BY MOUTH EVERY MORNING and TAKE TWO TABLETS BY MOUTH EVERY EVENING   Multiple Vitamin (MULTIVITAMIN WITH MINERALS) TABS tablet Take 1 tablet by mouth daily. Centrum   nitroGLYCERIN (NITROSTAT) 0.4 MG SL tablet Place 1 tablet (0.4 mg total) under the tongue every 5 (five) minutes x 3 doses as needed for chest pain.   NON FORMULARY CPAP at bedtime   nystatin-triamcinolone (MYCOLOG II) cream Apply 1 application topically 2 (two) times daily as needed.   omeprazole (PRILOSEC) 20 MG capsule TAKE ONE CAPSULE BY MOUTH BEFORE BREAKFAST and TAKE ONE CAPSULE BY MOUTH EVERYDAY AT BEDTIME   Polyethyl Glycol-Propyl Glycol (SYSTANE) 0.4-0.3 % SOLN Place 1 drop into both eyes daily as needed (dry eyes).   potassium chloride SA (KLOR-CON M) 20 MEQ tablet Take 1 tablet (20 mEq total) by mouth daily.   predniSONE (DELTASONE) 20 MG tablet Take 1 tablet (20 mg total) by mouth 2 (two) times daily with a meal. May raise your blood sugar temporarily   spironolactone (ALDACTONE) 25 MG tablet Take 0.5 tablets (12.5 mg total) by mouth daily.   tirzepatide [facility]) 5 MG/0.5ML Pen Inject 5 mg into the skin once a week.   torsemide (DEMADEX) 20 MG tablet Take 3 tablets (60 mg total) by mouth daily.   TRESIBA FLEXTOUCH 100 UNIT/ML FlexTouch Pen Inject 28 units into THE SKIN daily    valACYclovir (VALTREX) 1000 MG tablet TAKE 2 TABLETS BY MOUTH EVERY 12 HOURS AS NEEDED   Facility-Administered Encounter Medications as of 10/20/2022  Medication   sodium chloride flush (NS) 0.9 % injection 3 mL   BP Readings from Last 3 Encounters:  08/23/22 120/72  08/10/22 120/78  07/10/22 110/70    Pulse Readings from Last 3 Encounters:  08/23/22 65  08/10/22 (!) 54  07/10/22 68    Lab Results  Component Value Date/Time   HGBA1C 7.0 (A) 04/18/2022 11:19 AM   HGBA1C 7.7 (A) 12/26/2021 03:38 PM   HGBA1C 8.0 (H) 08/23/2021 09:35 AM   HGBA1C 8.2 (H) 04/20/2021 11:30 AM   Lab Results  Component Value Date   CREATININE 1.40 (H) 08/30/2022   BUN 32 (H) 08/30/2022   GFR 38.69 (L) 12/28/2021   GFRNONAA 39 (L) 01/16/2022  GFRAA 40 (L) 07/28/2020   NA 141 08/30/2022   K 3.9 08/30/2022   CALCIUM 10.7 (H) 08/30/2022   CO2 29 08/30/2022       Pamala Duffel CMA Clinical Pharmacist Assistant [phone]

## 2022-10-21 DIAGNOSIS — G4710 Hypersomnia, unspecified: Secondary | ICD-10-CM | POA: Diagnosis not present

## 2022-10-21 DIAGNOSIS — I10 Essential (primary) hypertension: Secondary | ICD-10-CM | POA: Diagnosis not present

## 2022-10-21 DIAGNOSIS — I272 Pulmonary hypertension, unspecified: Secondary | ICD-10-CM | POA: Diagnosis not present

## 2022-10-21 DIAGNOSIS — G4733 Obstructive sleep apnea (adult) (pediatric): Secondary | ICD-10-CM | POA: Diagnosis not present

## 2022-10-23 ENCOUNTER — Telehealth: Payer: Self-pay | Admitting: Cardiology

## 2022-10-23 ENCOUNTER — Ambulatory Visit: Payer: Medicare Other | Attending: Cardiology

## 2022-10-23 DIAGNOSIS — I2510 Atherosclerotic heart disease of native coronary artery without angina pectoris: Secondary | ICD-10-CM | POA: Diagnosis not present

## 2022-10-23 DIAGNOSIS — Z79899 Other long term (current) drug therapy: Secondary | ICD-10-CM | POA: Diagnosis not present

## 2022-10-23 DIAGNOSIS — E785 Hyperlipidemia, unspecified: Secondary | ICD-10-CM | POA: Diagnosis not present

## 2022-10-23 NOTE — Telephone Encounter (Signed)
Pt c/o medication issue:  1. Name of Medication: tirzepatide [facility]) 5 MG/0.5ML Pen   2. How are you currently taking this medication (dosage and times per day)?    Inject 5 mg into the skin once a week.    3. Are you having a reaction (difficulty breathing--STAT)? no  4. What is your medication issue? Patient states at the injection site she has a rash (about the size of a silver dollar) and it's itchy.  She wants to know if she should continue to use this medication.

## 2022-10-24 LAB — LIPID PANEL
Chol/HDL Ratio: 3.2 ratio (ref 0.0–4.4)
Cholesterol, Total: 104 mg/dL (ref 100–199)
HDL: 33 mg/dL — ABNORMAL LOW (ref >39)
LDL Chol Calc (NIH): 42 mg/dL (ref 0–99)
Triglycerides: 173 mg/dL — ABNORMAL HIGH (ref 0–149)
VLDL Cholesterol Cal: 29 mg/dL (ref 5–40)

## 2022-10-25 NOTE — Telephone Encounter (Signed)
Spoke to patient, reports she had small rash at the injection site after 2nd and 3rd dose of tirzepatide. Rash was itchy initially but it improves after day or two. Other than this small reaction she denies   rash, itching/swelling (especially of the face/tongue/throat), severe dizziness, trouble breathing.  Suggest to apply ice to the injection site if rash develops at next dose. And watch for serious allergic reaction.  (rash, itching/swelling (especially of the face/tongue/throat), severe dizziness, trouble breathing)

## 2022-10-27 ENCOUNTER — Telehealth: Payer: Self-pay

## 2022-10-27 NOTE — Progress Notes (Signed)
Care Management & Coordination Services Pharmacy Team  Reason for Encounter: Appointment Reminder  Contacted patient to confirm telephone appointment with Delano Metz, PharmD on 10/30/2022 at 1:00. Unsuccessful outreach. Left voicemail for patient to return call.  Care Gaps: AWV - completed 01/12/2022 Last eye exam - 07/05/2021 Last foot exam - 01/28/2020 Last BP - 120/72 on 08/23/2022 Last A1C - 7.0 on 04/18/2022 Urine ACR - overdue Tdap - overdue Covid - overdue   Star Rating Drugs: Metformin 1000 mg - Last filled 09/27/2022 30 DS [facility]  Empagliflozin 25 mg - Last filled 09/27/2022 30 [facility] Atorvastatin 40 mg - Last filled 09/27/2022 30 [facility]   Inetta Fermo [facility]  Clinical Pharmacist Assistant [phone]

## 2022-10-30 ENCOUNTER — Ambulatory Visit: Payer: Medicare Other

## 2022-11-05 DIAGNOSIS — J849 Interstitial pulmonary disease, unspecified: Secondary | ICD-10-CM | POA: Diagnosis not present

## 2022-11-05 DIAGNOSIS — J9601 Acute respiratory failure with hypoxia: Secondary | ICD-10-CM | POA: Diagnosis not present

## 2022-11-05 DIAGNOSIS — J8410 Pulmonary fibrosis, unspecified: Secondary | ICD-10-CM | POA: Diagnosis not present

## 2022-11-16 ENCOUNTER — Telehealth: Payer: Self-pay | Admitting: Pharmacist

## 2022-11-16 NOTE — Telephone Encounter (Signed)
Called pt to follow up on how she is doing on Mounjaro. LVM for pt to call back. Previously reported an injection site reaction with Mounjaro.

## 2022-11-17 MED ORDER — MOUNJARO 7.5 MG/0.5ML ~~LOC~~ SOAJ: 7.5000 mg | SUBCUTANEOUS | 0 refills | Status: DC

## 2022-11-17 NOTE — Telephone Encounter (Signed)
Called and spoke with patient. States she has some constipation. Still going everyday but has hemorrhoids so it gets back it her stool gets hard. Recommended miralax, docusate or benefiber. She is down to 218lb Still has a mild injection site reaction, but its not any worse. Will increase to 7.5mg  weekly of Mounjaro.

## 2022-11-20 ENCOUNTER — Telehealth: Payer: Self-pay

## 2022-11-20 DIAGNOSIS — G4710 Hypersomnia, unspecified: Secondary | ICD-10-CM | POA: Diagnosis not present

## 2022-11-20 DIAGNOSIS — I272 Pulmonary hypertension, unspecified: Secondary | ICD-10-CM | POA: Diagnosis not present

## 2022-11-20 DIAGNOSIS — I10 Essential (primary) hypertension: Secondary | ICD-10-CM | POA: Diagnosis not present

## 2022-11-20 DIAGNOSIS — G4733 Obstructive sleep apnea (adult) (pediatric): Secondary | ICD-10-CM | POA: Diagnosis not present

## 2022-11-20 NOTE — Progress Notes (Signed)
Patient ID: [name], female   DOB: Apr 07, 1946, 77 y.o.   MRN: [id number]  Care Management & Coordination Services Pharmacy Team  Reason for Encounter: Medication coordination and delivery  Contacted patient to discuss medications and coordinate delivery from [facility]. Spoke with patient on 11/20/2022  Cycle dispensing form sent to [facility] for review.   Last adherence delivery date:11/01/22       Patient is due for next adherence delivery on: 11/30/22  This delivery to include: Adherence Packaging  30 Days  Torsemide 20 mg: Take 3 tabs before breakfast Eliquis 5 mg: Take one tab at Breakfast and one tab at Bedtime Atorvastatin 40 mg: take one tab at Breakfast Diltiazem 360 mg: Take one tab at Breakfast Duloxetine 60 mg: Take one cap at Breakfast Jardiance 25 mg: Take one tab before Breakfast Metformin 1000 mg: Take one tab at Breakfast and one tab at Dinner Potassium 20 Meq: Take one at Bedtime Omeprazole 20 mg: Take one cap before Breakfast and one at Bedtime Dofetilide 250 mg: Take one cap at Breakfast and one at Bedtime Spironolactone 25 mg: Take half tab at Breakfast Metoprolol 50 mg: Take one tab at Breakfast and 2 tabs at Bedtime Dexcom G7 Sensors Ezetimibe 10 mg : Take one at Breakfast Mounjaro 7.5 mg Pen Tresiba Flex Inj 100 u  Patient aware of increased dose of  Mounjaro 7.5 mg Pen   Confirmed delivery date of 11/30/22, advised patient that pharmacy will contact them the morning of delivery.   Any concerns about your medications? No  How often do you forget or accidentally miss a dose? Never  Do you use a pillbox? No  Is patient in packaging Yes   Chart review: Recent office visits:  None  Recent consult visits:  None   Hospital visits:  None in previous 6 months  Medications: Outpatient Encounter Medications as of 11/20/2022  Medication Sig   albuterol (VENTOLIN HFA) 108 (90 Base) MCG/ACT inhaler Inhale 2 puffs into the lungs every 4  (four) hours as needed for wheezing or shortness of breath.   apixaban (ELIQUIS) 5 MG TABS tablet Take 1 tablet (5 mg total) by mouth 2 (two) times daily.   atorvastatin (LIPITOR) 40 MG tablet TAKE ONE TABLET BY MOUTH ONCE DAILY   BD PEN NEEDLE NANO 2ND GEN 32G X 4 MM MISC daily.   benzonatate (TESSALON PERLES) 100 MG capsule Take 1 capsule (100 mg total) by mouth 3 (three) times daily as needed for cough.   Cholecalciferol (DIALYVITE VITAMIN D 5000) 125 MCG (5000 UT) capsule Take 5,000 Units by mouth daily.   Continuous Blood Gluc Receiver (DEXCOM G7 RECEIVER) DEVI Use as directed while on insulin   Continuous Blood Gluc Sensor (DEXCOM G7 SENSOR) MISC USE TO check blood glucose AS DIRECTED AND CHANGE sensor every 10 DAYS   diltiazem (CARDIZEM LA) 360 MG 24 hr tablet TAKE ONE TABLET BY MOUTH ONCE DAILY   dofetilide (TIKOSYN) 250 MCG capsule Take 1 capsule (250 mcg total) by mouth 2 (two) times daily. KEEP UPCOMING APPOINTMENT FOR FUTURE REFILLS.   DULoxetine (CYMBALTA) 60 MG capsule TAKE ONE CAPSULE BY MOUTH ONCE DAILY   empagliflozin (JARDIANCE) 25 MG TABS tablet Take 1 tablet (25 mg total) by mouth daily.   ezetimibe (ZETIA) 10 MG tablet Take 1 tablet (10 mg total) by mouth daily.   fluticasone (FLONASE) 50 MCG/ACT nasal spray Place 1 spray into both nostrils daily as needed for allergies.   ipratropium (ATROVENT) 0.06 %  nasal spray 1-2 puffs each nostril up to 4 times daily   loratadine (CLARITIN) 10 MG tablet TAKE 1 TABLET BY MOUTH EVERY DAY   Lysine 1000 MG TABS Take 1,000 mg by mouth at bedtime.    metFORMIN (GLUCOPHAGE) 1000 MG tablet TAKE ONE TABLET BY MOUTH TWICE DAILY   metolazone (ZAROXOLYN) 2.5 MG tablet TAKE 1 TABLET BY MOUTH EVERY 4  DAYS AS NEEDED FOR SWELLING  PLEASE KEEP UPCOMING APPOINTMENT FURTHER REFILLS   metoprolol tartrate (LOPRESSOR) 50 MG tablet TAKE ONE TABLET BY MOUTH EVERY MORNING and TAKE TWO TABLETS BY MOUTH EVERY EVENING   Multiple Vitamin (MULTIVITAMIN WITH  MINERALS) TABS tablet Take 1 tablet by mouth daily. Centrum   nitroGLYCERIN (NITROSTAT) 0.4 MG SL tablet Place 1 tablet (0.4 mg total) under the tongue every 5 (five) minutes x 3 doses as needed for chest pain.   NON FORMULARY CPAP at bedtime   nystatin-triamcinolone (MYCOLOG II) cream Apply 1 application topically 2 (two) times daily as needed.   omeprazole (PRILOSEC) 20 MG capsule TAKE ONE CAPSULE BY MOUTH BEFORE BREAKFAST and TAKE ONE CAPSULE BY MOUTH EVERYDAY AT BEDTIME   Polyethyl Glycol-Propyl Glycol (SYSTANE) 0.4-0.3 % SOLN Place 1 drop into both eyes daily as needed (dry eyes).   potassium chloride SA (KLOR-CON M) 20 MEQ tablet Take 1 tablet (20 mEq total) by mouth daily.   predniSONE (DELTASONE) 20 MG tablet Take 1 tablet (20 mg total) by mouth 2 (two) times daily with a meal. May raise your blood sugar temporarily   spironolactone (ALDACTONE) 25 MG tablet Take 0.5 tablets (12.5 mg total) by mouth daily.   tirzepatide (MOUNJARO) 7.5 MG/0.5ML Pen Inject 7.5 mg into the skin once a week.   torsemide (DEMADEX) 20 MG tablet Take 3 tablets (60 mg total) by mouth daily.   TRESIBA FLEXTOUCH 100 UNIT/ML FlexTouch Pen Inject 28 units into THE SKIN daily   valACYclovir (VALTREX) 1000 MG tablet TAKE 2 TABLETS BY MOUTH EVERY 12 HOURS AS NEEDED   Facility-Administered Encounter Medications as of 11/20/2022  Medication   sodium chloride flush (NS) 0.9 % injection 3 mL   BP Readings from Last 3 Encounters:  08/23/22 120/72  08/10/22 120/78  07/10/22 110/70    Pulse Readings from Last 3 Encounters:  08/23/22 65  08/10/22 (!) 54  07/10/22 68    Lab Results  Component Value Date/Time   HGBA1C 7.0 (A) 04/18/2022 11:19 AM   HGBA1C 7.7 (A) 12/26/2021 03:38 PM   HGBA1C 8.0 (H) 08/23/2021 09:35 AM   HGBA1C 8.2 (H) 04/20/2021 11:30 AM   Lab Results  Component Value Date   CREATININE 1.40 (H) 08/30/2022   BUN 32 (H) 08/30/2022   GFR 38.69 (L) 12/28/2021   GFRNONAA 39 (L) 01/16/2022   GFRAA 40  (L) 07/28/2020   NA 141 08/30/2022   K 3.9 08/30/2022   CALCIUM 10.7 (H) 08/30/2022   CO2 29 08/30/2022       Pamala Duffel CMA Clinical Pharmacist Assistant [phone]

## 2022-11-21 ENCOUNTER — Ambulatory Visit: Payer: Medicare Other | Admitting: Internal Medicine

## 2022-11-28 ENCOUNTER — Other Ambulatory Visit: Payer: Self-pay | Admitting: Internal Medicine

## 2022-11-28 DIAGNOSIS — E1165 Type 2 diabetes mellitus with hyperglycemia: Secondary | ICD-10-CM

## 2022-12-06 DIAGNOSIS — J9601 Acute respiratory failure with hypoxia: Secondary | ICD-10-CM | POA: Diagnosis not present

## 2022-12-06 DIAGNOSIS — J8410 Pulmonary fibrosis, unspecified: Secondary | ICD-10-CM | POA: Diagnosis not present

## 2022-12-06 DIAGNOSIS — J849 Interstitial pulmonary disease, unspecified: Secondary | ICD-10-CM | POA: Diagnosis not present

## 2022-12-18 ENCOUNTER — Other Ambulatory Visit: Payer: Self-pay | Admitting: Family

## 2022-12-21 DIAGNOSIS — I272 Pulmonary hypertension, unspecified: Secondary | ICD-10-CM | POA: Diagnosis not present

## 2022-12-21 DIAGNOSIS — G4710 Hypersomnia, unspecified: Secondary | ICD-10-CM | POA: Diagnosis not present

## 2022-12-21 DIAGNOSIS — G4733 Obstructive sleep apnea (adult) (pediatric): Secondary | ICD-10-CM | POA: Diagnosis not present

## 2022-12-21 DIAGNOSIS — I10 Essential (primary) hypertension: Secondary | ICD-10-CM | POA: Diagnosis not present

## 2022-12-25 ENCOUNTER — Telehealth: Payer: Self-pay | Admitting: Pharmacist

## 2022-12-25 MED ORDER — MOUNJARO 7.5 MG/0.5ML ~~LOC~~ SOAJ: 7.5000 mg | SUBCUTANEOUS | 11 refills | Status: DC

## 2022-12-25 NOTE — Telephone Encounter (Signed)
Received fax request from [facility] for [facility] 7.5mg  refill. Called pt to assess tolerability, last note mentions some constipation on 5mg  dose.  Pt reports constipation on 7.5mg  dose as well, taking laxatives every day (3 dulcolax with lunch every day). Down to 213 lbs this AM. She is pleased with how the med is working, does not want to decrease her dose. Don't recommend increasing her dose since she's already needing daily meds for constipation. Encouraged her to stay hydrated with water and increase her fiber intake. She will continue on 7.5mg  dose and call us with any concerns. Refill sent in.

## 2023-01-05 DIAGNOSIS — J8410 Pulmonary fibrosis, unspecified: Secondary | ICD-10-CM | POA: Diagnosis not present

## 2023-01-05 DIAGNOSIS — J849 Interstitial pulmonary disease, unspecified: Secondary | ICD-10-CM | POA: Diagnosis not present

## 2023-01-05 DIAGNOSIS — J9601 Acute respiratory failure with hypoxia: Secondary | ICD-10-CM | POA: Diagnosis not present

## 2023-01-10 ENCOUNTER — Ambulatory Visit: Payer: Medicare Other | Admitting: Internal Medicine

## 2023-01-10 ENCOUNTER — Telehealth: Payer: Self-pay | Admitting: Internal Medicine

## 2023-01-10 DIAGNOSIS — J9611 Chronic respiratory failure with hypoxia: Secondary | ICD-10-CM | POA: Diagnosis not present

## 2023-01-10 LAB — PULMONARY FUNCTION TEST
DL/VA % pred: 127 %
DL/VA: 5.14 ml/min/mmHg/L
DLCO cor % pred: 74 %
DLCO cor: 15.37 ml/min/mmHg
DLCO unc % pred: 74 %
DLCO unc: 15.37 ml/min/mmHg
FEF 25-75 Post: 2.14 L/sec
FEF 25-75 Pre: 1.45 L/sec
FEF2575-%Change-Post: 47 %
FEF2575-%Pred-Post: 121 %
FEF2575-%Pred-Pre: 82 %
FEV1-%Change-Post: 8 %
FEV1-%Pred-Post: 52 %
FEV1-%Pred-Pre: 48 %
FEV1-Post: 1.22 L
FEV1-Pre: 1.12 L
FEV1FVC-%Change-Post: 0 %
FEV1FVC-%Pred-Pre: 114 %
FEV6-%Change-Post: 8 %
FEV6-%Pred-Post: 48 %
FEV6-%Pred-Pre: 44 %
FEV6-Post: 1.42 L
FEV6-Pre: 1.31 L
FEV6FVC-%Pred-Post: 105 %
FEV6FVC-%Pred-Pre: 105 %
FVC-%Change-Post: 8 %
FVC-%Pred-Post: 46 %
FVC-%Pred-Pre: 42 %
FVC-Post: 1.42 L
Post FEV1/FVC ratio: 86 %
Post FEV6/FVC ratio: 100 %
Pre FEV1/FVC ratio: 86 %
Pre FEV6/FVC Ratio: 100 %
RV % pred: 110 %
RV: 2.7 L
TLC % pred: 81 %
TLC: 4.41 L

## 2023-01-10 MED ORDER — DEXCOM G7 RECEIVER DEVI
3 refills | Status: AC
  Filled 2023-02-23: qty 1, 30d supply, fill #0

## 2023-01-10 NOTE — Telephone Encounter (Signed)
Rx's sent in - request was sent to covering provider instead of PCP.

## 2023-01-10 NOTE — Patient Instructions (Signed)
Full PFT performed today. °

## 2023-01-10 NOTE — Progress Notes (Signed)
Full PFT performed today. °

## 2023-01-10 NOTE — Telephone Encounter (Signed)
Prescription Request  01/10/2023  LOV: 05/01/2022  What is the name of the medication or equipment?   Continuous Blood Gluc Receiver (DEXCOM G7 RECEIVER) DEVI  Continuous Blood Gluc Sensor (DEXCOM G7 SENSOR) MISC  Pt states she has been without for 2 days now, and the pharmacy told her they are waiting for MD  Have you contacted your pharmacy to request a refill? Yes   Which pharmacy would you like this sent to?  [facility] - [place], [place] - [address] Phone: [phone]  Fax: [phone]       Patient notified that their request is being sent to the clinical staff for review and that they should receive a response within 2 business days.   Please advise at Mobile [phone] (mobile)

## 2023-01-15 ENCOUNTER — Other Ambulatory Visit: Payer: Self-pay | Admitting: Family

## 2023-01-17 ENCOUNTER — Ambulatory Visit: Payer: Medicare Other

## 2023-01-17 VITALS — Ht 66.5 in | Wt 213.0 lb

## 2023-01-17 DIAGNOSIS — Z0000 Encounter for general adult medical examination without abnormal findings: Secondary | ICD-10-CM

## 2023-01-17 NOTE — Progress Notes (Signed)
Subjective:   [name] is a 77 y.o. female who presents for Medicare Annual (Subsequent) preventive examination.  Visit Complete: Virtual  I connected with  Landra C Ozga on 01/17/23 by a audio enabled telemedicine application and verified that I am speaking with the correct person using two identifiers.  Patient Location: Home  Provider Location: Home Office  I discussed the limitations of evaluation and management by telemedicine. The patient expressed understanding and agreed to proceed.  Patient Medicare AWV questionnaire was completed by the patient on  ; I have confirmed that all information answered by patient is correct and no changes since this date.  Review of Systems     Cardiac Risk Factors include: advanced age (>80men, >68 women);diabetes mellitus;hypertension     Objective:    Today's Vitals   01/17/23 1054  Weight: 213 lb (96.6 kg)  Height: 5' 6.5" (1.689 m)   Body mass index is 33.86 kg/m.     01/17/2023   11:01 AM 05/02/2022    7:47 AM 01/12/2022    9:59 AM 01/04/2022    7:01 AM 12/11/2021    6:52 AM 11/30/2020    8:57 AM 07/07/2020    6:45 AM  Advanced Directives  Does Patient Have a Medical Advance Directive? Yes Yes Yes Yes Yes Yes Yes  Type of Estate agent of [place];Living will Healthcare Power of [place];Living will Healthcare Power of [place];Living will Healthcare Power of [place];Living will Living will Healthcare Power of [employer] Power of [place];Living will  Does patient want to make changes to medical advance directive?   No - Patient declined No - Patient declined No - Patient declined  No - Patient declined  Copy of Healthcare Power of Attorney in Chart? No - copy requested No - copy requested No - copy requested No - copy requested  No - copy requested No - copy requested    Current Medications (verified) Outpatient Encounter Medications as of 01/17/2023  Medication Sig   apixaban (ELIQUIS) 5 MG  TABS tablet Take 1 tablet (5 mg total) by mouth 2 (two) times daily.   atorvastatin (LIPITOR) 40 MG tablet TAKE ONE TABLET BY MOUTH ONCE DAILY   BD PEN NEEDLE NANO 2ND GEN 32G X 4 MM MISC daily.   Continuous Glucose Receiver (DEXCOM G7 RECEIVER) DEVI Use as directed while on insulin   Continuous Glucose Sensor (DEXCOM G7 SENSOR) MISC Apply new sensor every 10 days to monitor glucose. Remove old sensor before applying new one.   diltiazem (CARDIZEM LA) 360 MG 24 hr tablet TAKE ONE TABLET BY MOUTH ONCE DAILY   dofetilide (TIKOSYN) 250 MCG capsule Take 1 capsule (250 mcg total) by mouth 2 (two) times daily. KEEP UPCOMING APPOINTMENT FOR FUTURE REFILLS.   DULoxetine (CYMBALTA) 60 MG capsule TAKE ONE CAPSULE BY MOUTH ONCE DAILY   empagliflozin (JARDIANCE) 25 MG TABS tablet TAKE ONE TABLET BY MOUTH BEFORE BREAKFAST   ezetimibe (ZETIA) 10 MG tablet Take 1 tablet (10 mg total) by mouth daily.   fluticasone (FLONASE) 50 MCG/ACT nasal spray Place 1 spray into both nostrils daily as needed for allergies.   ipratropium (ATROVENT) 0.06 % nasal spray 1-2 puffs each nostril up to 4 times daily   loratadine (CLARITIN) 10 MG tablet TAKE 1 TABLET BY MOUTH EVERY DAY   metFORMIN (GLUCOPHAGE) 1000 MG tablet TAKE ONE TABLET BY MOUTH TWICE DAILY   metolazone (ZAROXOLYN) 2.5 MG tablet TAKE 1 TABLET BY MOUTH EVERY 4  DAYS AS NEEDED FOR  SWELLING  PLEASE KEEP UPCOMING APPOINTMENT FURTHER REFILLS   metoprolol tartrate (LOPRESSOR) 50 MG tablet TAKE ONE TABLET BY MOUTH EVERY MORNING and TAKE TWO TABLETS BY MOUTH EVERY EVENING   nitroGLYCERIN (NITROSTAT) 0.4 MG SL tablet Place 1 tablet (0.4 mg total) under the tongue every 5 (five) minutes x 3 doses as needed for chest pain.   NON FORMULARY CPAP at bedtime   nystatin-triamcinolone (MYCOLOG II) cream Apply 1 application topically 2 (two) times daily as needed.   omeprazole (PRILOSEC) 20 MG capsule TAKE ONE CAPSULE BY MOUTH BEFORE BREAKFAST and TAKE ONE CAPSULE EVERYDAY AT  BEDTIME   Polyethyl Glycol-Propyl Glycol (SYSTANE) 0.4-0.3 % SOLN Place 1 drop into both eyes daily as needed (dry eyes).   potassium chloride SA (KLOR-CON M) 20 MEQ tablet Take 1 tablet (20 mEq total) by mouth daily.   predniSONE (DELTASONE) 20 MG tablet Take 1 tablet (20 mg total) by mouth 2 (two) times daily with a meal. May raise your blood sugar temporarily   spironolactone (ALDACTONE) 25 MG tablet Take 0.5 tablets (12.5 mg total) by mouth daily.   tirzepatide (MOUNJARO) 7.5 MG/0.5ML Pen Inject 7.5 mg into the skin once a week.   torsemide (DEMADEX) 20 MG tablet Take 3 tablets (60 mg total) by mouth daily.   TRESIBA FLEXTOUCH 100 UNIT/ML FlexTouch Pen Inject 28 units into THE SKIN daily   valACYclovir (VALTREX) 1000 MG tablet TAKE 2 TABLETS BY MOUTH EVERY 12 HOURS AS NEEDED   [DISCONTINUED] Lysine 1000 MG TABS Take 1,000 mg by mouth at bedtime.    [DISCONTINUED] Multiple Vitamin (MULTIVITAMIN WITH MINERALS) TABS tablet Take 1 tablet by mouth daily. Centrum   Facility-Administered Encounter Medications as of 01/17/2023  Medication   sodium chloride flush (NS) 0.9 % injection 3 mL    Allergies (verified) Hydrocodone-guaifenesin and Adhesive [tape]   History: Past Medical History:  Diagnosis Date   Anticoagulant long-term use    pradaxa   Anxiety    Arthritis    "fingers, lower back" (04/23/2017    CAD (coronary artery disease) 0960,4540   post PTCA with bare-metal stenting to mid RCA in December 2004      CHF (congestive heart failure) (HCC)    Chronic atrial fibrillation (HCC) 06/2007   Tachybradycardia pacemaker   Chronic kidney disease    10% function - ?R, other kidney is compensating     CVA (cerebral vascular accident) [facility]) [id number]   denies residual on 04/23/2017   Depression    Diplopia 06/19/2008   Qualifier: Diagnosis of  By: Fabian Sharp MD, Neta Mends    Dysrhythmia    ATRIAL FIBRILATION   Edema of lower extremity    Hyperlipidemia    Hypertension    Inferior  myocardial infarction [facility])    acute inferior wall mi/other medical hx   Myocardial infarction [facility]) [phone]   Obesity    OSA on CPAP    last test- 2010   Pacemaker    Pneumonia 2014   tx. ----  [facility]   Pulmonary hypertension (HCC)    moderate pulmonary hypertension by 10/2016 echo and 10/2013 cardiac cath   Shortness of breath    Skin cancer    "cut off right [place]; burned off LLE" (04/23/2017)   Sleep apnea    Spondylolisthesis    TIA (transient ischemic attack) 2008   Unspecified hemorrhoids without mention of complication 07/15/2003   Colonoscopy--Dr. Leone Payor    Past Surgical History:  Procedure Laterality Date   ABDOMINAL HYSTERECTOMY  APPENDECTOMY  1984   ATRIAL FIBRILLATION ABLATION N/A 09/29/2016   Procedure: Atrial Fibrillation Ablation;  Surgeon: Will Jorja Loa, MD;  [facility]: [facility] INVASIVE CV LAB;  Service: Cardiovascular;  Laterality: N/A;   ATRIAL FIBRILLATION ABLATION N/A 02/07/2018   Procedure: ATRIAL FIBRILLATION ABLATION;  Surgeon: Regan Lemming, MD;  [facility]: [facility]VAS[facility] LAB;  Service: Cardiovascular;  Laterality: N/A;   ATRIAL FIBRILLATION ABLATION N/A 03/13/2019   Procedure: ATRIAL FIBRILLATION ABLATION;  Surgeon: Regan Lemming, MD;  [facility];  Service: Cardiovascular;  Laterality: N/A;   ATRIAL FIBRILLATION ABLATION N/A 07/07/2020   Procedure: ATRIAL FIBRILLATION ABLATION;  Surgeon: Regan Lemming, MD;  [facility];  Service: Cardiovascular;  Laterality: N/A;   BACK SURGERY     CARDIOVERSION N/A 09/12/2017   Procedure: CARDIOVERSION;  Surgeon: Jake Bathe, MD;  Location: [facility] ENDOSCOPY;  Service: Cardiovascular;  Laterality: N/A;   CARDIOVERSION N/A 12/13/2017   Procedure: CARDIOVERSION;  Surgeon: Thurmon Fair, MD;  [facility]: [facility] ENDOSCOPY;  Service: Cardiovascular;  Laterality: N/A;   CARDIOVERSION N/A 02/18/2018   Procedure: CARDIOVERSION;  Surgeon: Lars Masson, MD;  Location: [facility]  ENDOSCOPY;  Service: Cardiovascular;  Laterality: N/A;   CARDIOVERSION N/A 05/20/2018   Procedure: CARDIOVERSION;  Surgeon: Quintella Reichert, MD;  Location: [facility] ENDOSCOPY;  Service: Cardiovascular;  Laterality: N/A;   CARDIOVERSION N/A 04/16/2019   Procedure: CARDIOVERSION;  Surgeon: Little Ishikawa, MD;  Location: [facility] ENDOSCOPY;  Service: Endoscopy;  Laterality: N/A;   CARDIOVERSION N/A 01/22/2020   Procedure: CARDIOVERSION;  Surgeon: Sande Rives, MD;  Location: [facility] ENDOSCOPY;  Service: Cardiovascular;  Laterality: N/A;   CARDIOVERSION N/A 02/18/2020   Procedure: CARDIOVERSION;  Surgeon: Parke Poisson, MD;  Location: [facility] ENDOSCOPY;  Service: Cardiovascular;  Laterality: N/A;   CATARACT EXTRACTION W/ INTRAOCULAR LENS  IMPLANT, BILATERAL Bilateral 01/15/2017- 03/2017   CHOLECYSTECTOMY     CORONARY ANGIOPLASTY  X 2   CORONARY ANGIOPLASTY WITH STENT PLACEMENT  1998; ~ 2007; ?date   "1 stent; replaced stent; not sure when I got the last stent" (04/23/2017)   DOPPLER ECHOCARDIOGRAPHY  2009   ELECTROPHYSIOLOGIC STUDY N/A 03/31/2016   Procedure: Cardioversion;  Surgeon: Marinus Maw, MD;  [facility];  Service: Cardiovascular;  Laterality: N/A;   ELECTROPHYSIOLOGIC STUDY N/A 08/04/2016   Procedure: Cardioversion;  Surgeon: Marinus Maw, MD;  Location: [facility] INVASIVE CV LAB;  Service: Cardiovascular;  Laterality: N/A;   INSERT / REPLACE / REMOVE PACEMAKER  06/2007   IR RADIOLOGY PERIPHERAL GUIDED IV START  01/31/2018   IR US GUIDE VASC ACCESS LEFT  01/31/2018   JOINT REPLACEMENT     LAPAROSCOPIC CHOLECYSTECTOMY  1994   LEFT AND RIGHT HEART CATHETERIZATION WITH CORONARY ANGIOGRAM N/A 10/06/2013   Procedure: LEFT AND RIGHT HEART CATHETERIZATION WITH CORONARY ANGIOGRAM;  Surgeon: Lennette Bihari, MD;  Location: [facility] CATH LAB;  Service: Cardiovascular;  Laterality: N/A;   LEFT OOPHORECTOMY Left ~ 1989   POSTERIOR LUMBAR FUSION  2000s - 04/2015 X 3   L3-4; L4-5; L2-3; Dr Dutch Quint    RIGHT HEART CATH N/A 01/04/2022   Procedure: RIGHT HEART CATH;  Surgeon: Laurey Morale, MD;  Location: [facility] INVASIVE CV LAB;  Service: Cardiovascular;  Laterality: N/A;   RIGHT HEART CATH N/A 05/02/2022   Procedure: RIGHT HEART CATH;  Surgeon: Laurey Morale, MD;  Location: [facility] INVASIVE CV LAB;  Service: Cardiovascular;  Laterality: N/A;   RIGHT/LEFT HEART CATH AND CORONARY ANGIOGRAPHY  N/A 05/09/2017   Procedure: RIGHT/LEFT HEART CATH AND CORONARY ANGIOGRAPHY;  Surgeon: Tonny Bollman, MD;  Location: [facility] INVASIVE CV LAB;  Service: Cardiovascular;  Laterality: N/A;   SKIN CANCER EXCISION Right    [place]   TEE WITHOUT CARDIOVERSION N/A 09/29/2016   Procedure: TRANSESOPHAGEAL ECHOCARDIOGRAM (TEE);  Surgeon: Jake Bathe, MD;  Location: [facility] ENDOSCOPY;  Service: Cardiovascular;  Laterality: N/A;   TOTAL ABDOMINAL HYSTERECTOMY  1984   "uterus & right ovary"   TOTAL KNEE ARTHROPLASTY Left 04/23/2017   TOTAL KNEE ARTHROPLASTY Left 04/23/2017   Procedure: TOTAL KNEE ARTHROPLASTY;  Surgeon: Gean Birchwood, MD;  [facility] OR;  Service: Orthopedics;  Laterality: Left;   ULTRASOUND GUIDANCE FOR VASCULAR ACCESS  05/09/2017   Procedure: Ultrasound Guidance For Vascular Access;  Surgeon: Tonny Bollman, MD;  Location: [facility] INVASIVE CV LAB;  Service: Cardiovascular;;   Family History  Problem Relation Age of Onset   Suicidality Father        suicide death pt was 3 yrs, 89   Arrhythmia Mother    Hypertension Mother    Diabetes Mother    Dementia Mother    Heart attack Brother    Heart disease Paternal Aunt    Prostate cancer Maternal Grandfather    Diabetes Paternal Grandfather        fathers side of the family   Colon cancer Maternal Aunt    Social History   Socioeconomic History   Marital status: Married    Spouse name: Financial risk analyst   Number of children: 0   Years of education: HS   Highest education level: High school graduate  Occupational History   Occupation: retired    Comment: previously  worked [employer]  Tobacco Use   Smoking status: Former    Current packs/day: 0.00    Average packs/day: 1 pack/day for 5.0 years (5.0 ttl pk-yrs)    Types: Cigarettes    Start date: 05/11/1978    Quit date: 07/03/1982    Years since quitting: 40.5   Smokeless tobacco: Never  Vaping Use   Vaping status: Never Used  Substance and Sexual Activity   Alcohol use: Yes   Drug use: No   Sexual activity: Not Currently  Other Topics Concern   Not on file  Social History Narrative   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   Married    Originally from [place] of two, high school education   Former smoker 1979-1984 1ppd   Hunting dogs 7    Retired from [employer]   [id number]   [employer] 2    Social Determinants of Corporate investment banker Strain: Low Risk  (01/17/2023)   Overall Financial Resource Strain (CARDIA)    Difficulty of Paying Living Expenses: Not hard at all  Food Insecurity: No Food Insecurity (01/17/2023)   Hunger Vital Sign    Worried About Running Out of Food in the Last Year: Never true    Ran Out of Food in the Last Year: Never true  Transportation Needs: No Transportation Needs (01/17/2023)   PRAPARE - Administrator, Civil Service (Medical): No    Lack of Transportation (Non-Medical): No  Physical Activity: Insufficiently Active (01/17/2023)   Exercise Vital Sign    Days of Exercise per Week: 3 days    Minutes of Exercise per Session: 20 min  Stress: No Stress Concern Present (01/17/2023)   [employer] of Occupational Health - Occupational Stress Questionnaire  Feeling of Stress : Not at all  Social Connections: Socially Integrated (01/17/2023)   Social Connection and Isolation Panel [NHANES]    Frequency of Communication with Friends and Family: More than three times a week    Frequency of Social Gatherings with Friends and Family: More than three times a week    Attends Religious Services: More than 4 times  per year    Active Member of [employer] or Organizations: Yes    Attends Engineer, structural: More than 4 times per year    Marital Status: Married    Tobacco Counseling Counseling given: Not Answered   Clinical Intake:  Pre-visit preparation completed: No  Pain : No/denies pain     BMI - recorded: 33.86 Nutritional Status: BMI > 30  Obese Nutritional Risks: None Diabetes: Yes CBG done?: Yes CBG resulted in Enter/ Edit results?: Yes (CBG 143 Taken by patient) Did pt. bring in CBG monitor from home?: No  How often do you need to have someone help you when you read instructions, pamphlets, or other written materials from your doctor or pharmacy?: 1 - Never  Interpreter Needed?: No  Information entered by :: Theresa Mulligan LPN   Activities of Daily Living    01/17/2023   11:01 AM  In your present state of health, do you have any difficulty performing the following activities:  Hearing? 0  Vision? 0  Difficulty concentrating or making decisions? 0  Walking or climbing stairs? 0  Dressing or bathing? 0  Doing errands, shopping? 0  Preparing Food and eating ? N  Using the Toilet? N  In the past six months, have you accidently leaked urine? N  Do you have problems with loss of bowel control? N  Managing your Medications? N  Managing your Finances? N  Housekeeping or managing your Housekeeping? N    Patient Care Team: [name], Neta Mends, MD as PCP - General (Internal Medicine) Regan Lemming, MD as PCP - Electrophysiology (Cardiology) Meriam Sprague, MD as PCP - Cardiology (Cardiology) Ollen Gross, MD as Consulting Physician (Orthopedic Surgery) Kalman Shan, MD as Consulting Physician (Pulmonary Disease) Lennette Bihari, MD as Consulting Physician (Cardiology) Flasher, Milas Kocher, [facility] (Inactive) (Pharmacist)  Indicate any recent Medical Services you may have received from other than [facility] providers in the past year (date may be  approximate).     Assessment:   This is a routine wellness examination for [place].  Hearing/Vision screen Hearing Screening - Comments:: Denies hearing difficulties   Vision Screening - Comments:: Wears rx glasses - up to date with routine eye exams with  Dr Dione Booze  Dietary issues and exercise activities discussed:     Goals Addressed               This Visit's Progress     Increase physical activity (pt-stated)        Lose weight and eat healthy.       Depression Screen    01/17/2023   11:00 AM 04/18/2022   12:19 PM 04/11/2022    1:20 PM 01/12/2022    9:50 AM 12/26/2021    2:46 PM 11/30/2020    8:50 AM 11/17/2019   11:36 AM  PHQ 2/9 Scores  PHQ - 2 Score 0 1 0 0 2 1 0  PHQ- 9 Score  3 0 0 8      Fall Risk    01/17/2023   11:01 AM 04/18/2022   12:20 PM 04/11/2022    1:21 PM  01/12/2022    9:55 AM 12/26/2021    2:46 PM  Fall Risk   Falls in the past year? 0 0 0 0 0  Number falls in past yr: 0 0 0 0 0  Injury with Fall? 0 0 0 0 0  Risk for fall due to : No Fall Risks No Fall Risks No Fall Risks No Fall Risks No Fall Risks  Follow up Falls prevention discussed Falls evaluation completed Falls evaluation completed  Falls prevention discussed    MEDICARE RISK AT HOME:  Medicare Risk at Home - 01/17/23 1108     Any stairs in or around the home? Yes    If so, are there any without handrails? No    Home free of loose throw rugs in walkways, pet beds, electrical cords, etc? Yes    Adequate lighting in your home to reduce risk of falls? Yes    Life alert? Yes    Use of a cane, walker or w/c? No    Grab bars in the bathroom? Yes    Shower chair or bench in shower? Yes    Elevated toilet seat or a handicapped toilet? Yes             TIMED UP AND GO:  Was the test performed?  No    Cognitive Function:    11/22/2016    3:08 PM  MMSE - Mini Mental State Exam  Not completed: --        01/17/2023   11:02 AM 01/12/2022    9:59 AM 11/30/2020    8:59 AM  6CIT  Screen  What Year? 0 points 0 points 0 points  What month? 0 points 0 points 0 points  What time? 0 points 0 points 0 points  Count back from 20 0 points 0 points 0 points  Months in reverse 0 points 0 points 0 points  Repeat phrase 0 points 0 points 2 points  Total Score 0 points 0 points 2 points    Immunizations Immunization History  Administered Date(s) Administered   Fluad Quad(high Dose 65+) 04/10/2019, 04/20/2021, 04/04/2022   Influenza Split 04/18/2011, 05/06/2012   Influenza Whole 07/04/2007, 06/02/2009, 03/01/2010   Influenza, High Dose Seasonal PF 04/03/2014, 04/18/2016, 04/05/2017, 05/06/2018   Influenza,inj,Quad PF,6+ Mos 03/07/2013, 04/06/2015   PFIZER Comirnaty(Gray Top)Covid-19 Tri-Sucrose Vaccine 04/05/2022   PFIZER(Purple Top)SARS-COV-2 Vaccination 07/22/2019, 08/10/2019, 03/29/2020   PNEUMOCOCCAL CONJUGATE-20 04/22/2021   Pfizer Covid-19 Vaccine Bivalent Booster 77yrs & up 04/22/2021   Pneumococcal Conjugate-13 09/24/2013, 04/20/2020   Pneumococcal Polysaccharide-23 07/03/2005, 07/04/2007, 04/18/2011, 04/05/2022   Respiratory Syncytial Virus Vaccine,Recomb Aduvanted(Arexvy) 04/05/2022   Td 06/02/2009   Zoster Recombinant(Shingrix) 04/09/2020   Zoster, Live 07/03/2006    TDAP status: Due, Education has been provided regarding the importance of this vaccine. Advised may receive this vaccine at local pharmacy or Health Dept. Aware to provide a copy of the vaccination record if obtained from local pharmacy or Health Dept. Verbalized acceptance and understanding.  Flu Vaccine status: Up to date  Pneumococcal vaccine status: Up to date  Covid-19 vaccine status: Completed vaccines  Qualifies for Shingles Vaccine? Yes   Zostavax completed Yes   Shingrix Completed?: Yes  Screening Tests Health Maintenance  Topic Date Due   DTaP/Tdap/Td (2 - Tdap) 06/03/2019   FOOT EXAM  01/27/2021   HEMOGLOBIN A1C  10/18/2022   OPHTHALMOLOGY EXAM  01/17/2023 (Originally  07/05/2022)   Diabetic kidney evaluation - Urine ACR  01/18/2023 (Originally 01/23/2018)   COVID-19 Vaccine (6 -  2023-24 season) 02/02/2023 (Originally 05/31/2022)   INFLUENZA VACCINE  02/01/2023   Diabetic kidney evaluation - eGFR measurement  08/31/2023   Medicare Annual Wellness (AWV)  01/17/2024   Pneumonia Vaccine 31+ Years old  Completed   DEXA SCAN  Completed   Hepatitis C Screening  Completed   Zoster Vaccines- Shingrix  Completed   HPV VACCINES  Aged Out   Colonoscopy  Discontinued    Health Maintenance  Health Maintenance Due  Topic Date Due   DTaP/Tdap/Td (2 - Tdap) 06/03/2019   FOOT EXAM  01/27/2021   HEMOGLOBIN A1C  10/18/2022    Colorectal cancer screening: No longer required.   Mammogram status: No longer required due to Age.  Bone Density status: Completed 06/04/21. Results reflect: Bone density results: NORMAL. Repeat every   years.  Lung Cancer Screening: (Low Dose CT Chest recommended if Age 8-80 years, 20 pack-year currently smoking OR have quit w/in 15years.) does not qualify.    Additional Screening:  Hepatitis C Screening: does qualify; Completed 01/23/17  Vision Screening: Recommended annual ophthalmology exams for early detection of glaucoma and other disorders of the eye. Is the patient up to date with their annual eye exam?  Yes  Who is the provider or what is the name of the office in which the patient attends annual eye exams? Dr Dione Booze If pt is not established with a provider, would they like to be referred to a provider to establish care? No .   Dental Screening: Recommended annual dental exams for proper oral hygiene  Diabetic Foot Exam: Diabetic Foot Exam: Overdue, Pt has been advised about the importance in completing this exam. Pt is scheduled for diabetic foot exam on Followed by PCP.  Community Resource Referral / Chronic Care Management:  CRR required this visit?  No   CCM required this visit?  No     Plan:     I have personally  reviewed and noted the following in the patient's chart:   Medical and social history Use of alcohol, tobacco or illicit drugs  Current medications and supplements including opioid prescriptions. Patient is not currently taking opioid prescriptions. Functional ability and status Nutritional status Physical activity Advanced directives List of other physicians Hospitalizations, surgeries, and ER visits in previous 12 months Vitals Screenings to include cognitive, depression, and falls Referrals and appointments  In addition, I have reviewed and discussed with patient certain preventive protocols, quality metrics, and best practice recommendations. A written personalized care plan for preventive services as well as general preventive health recommendations were provided to patient.     Tillie Rung, LPN   10/09/8117   After Visit Summary: (Mail) Due to this being a telephonic visit, the after visit summary with patients personalized plan was offered to patient via mail   Nurse Notes: Patient due Hemoglobin A1C and Diabetic kidney evaluation-Urine ACR

## 2023-01-17 NOTE — Patient Instructions (Addendum)
[name] , Thank you for taking time to come for your Medicare Wellness Visit. I appreciate your ongoing commitment to your health goals. Please review the following plan we discussed and let me know if I can assist you in the future.   These are the goals we discussed:  Goals       Increase physical activity (pt-stated)      Lose weight and eat healthy.      Patient Stated      Lose weight       Weight (lb) < 200 lb (90.7 kg)      Motivation is a 5 and family;  You feel better when you eat salads; cut back on high calories foods as you are doing  Check out  online nutrition programs as WikiBlast.com.cy and LimitLaws.com.cy; fit5me; Look for foods with "whole" wheat; bran; oatmeal etc Shot at the farmer's markets in season for fresher choices  Watch for "hydrogenated" on the label of oils which are trans-fats.  Watch for "high fructose corn syrup" in snacks, yogurt or ketchup  Meats have less marbling; bright colored fruits and vegetables;  Canned; dump out liquid and wash vegetables. Be mindful of what we are eating  Portion control is essential to a health weight! Sit down; take a break and enjoy your meal; take smaller bites; put the fork down between bites;  It takes 20 minutes to get full; so check in with your fullness cues and stop eating when you start to fill full   Look up online 25 go to small 100  Or 150 calorie snacks and bag them Challenge yourself with new recipes  Adding protein to you meals is good; try to allow for one or two starches  Or pick your daily goal                This is a list of the screening recommended for you and due dates:  Health Maintenance  Topic Date Due   DTaP/Tdap/Td vaccine (2 - Tdap) 06/03/2019   Complete foot exam   01/27/2021   Hemoglobin A1C  10/18/2022   Eye exam for diabetics  01/17/2023*   Yearly kidney health urinalysis for diabetes  01/18/2023*   COVID-19 Vaccine (6 - 2023-24 season) 02/02/2023*   Flu Shot   02/01/2023   Yearly kidney function blood test for diabetes  08/31/2023   Medicare Annual Wellness Visit  01/17/2024   Pneumonia Vaccine  Completed   DEXA scan (bone density measurement)  Completed   Hepatitis C Screening  Completed   Zoster (Shingles) Vaccine  Completed   HPV Vaccine  Aged Out   Colon Cancer Screening  Discontinued  *Topic was postponed. The date shown is not the original due date.    Advanced directives: Please bring a copy of your health care power of attorney and living will to the office to be added to your chart at your convenience.   Conditions/risks identified: None  Next appointment: Follow up in one year for your annual wellness visit    Preventive Care 65 Years and Older, Female Preventive care refers to lifestyle choices and visits with your health care provider that can promote health and wellness. What does preventive care include? A yearly physical exam. This is also called an annual well check. Dental exams once or twice a year. Routine eye exams. Ask your health care provider how often you should have your eyes checked. Personal lifestyle choices, including: Daily care of your teeth and gums.  Regular physical activity. Eating a healthy diet. Avoiding tobacco and drug use. Limiting alcohol use. Practicing safe sex. Taking low-dose aspirin every day. Taking vitamin and mineral supplements as recommended by your health care provider. What happens during an annual well check? The services and screenings done by your health care provider during your annual well check will depend on your age, overall health, lifestyle risk factors, and family history of disease. Counseling  Your health care provider may ask you questions about your: Alcohol use. Tobacco use. Drug use. Emotional well-being. Home and relationship well-being. Sexual activity. Eating habits. History of falls. Memory and ability to understand (cognition). Work and work  Astronomer. Reproductive health. Screening  You may have the following tests or measurements: Height, weight, and BMI. Blood pressure. Lipid and cholesterol levels. These may be checked every 5 years, or more frequently if you are over 83 years old. Skin check. Lung cancer screening. You may have this screening every year starting at age 75 if you have a 30-pack-year history of smoking and currently smoke or have quit within the past 15 years. Fecal occult blood test (FOBT) of the stool. You may have this test every year starting at age 33. Flexible sigmoidoscopy or colonoscopy. You may have a sigmoidoscopy every 5 years or a colonoscopy every 10 years starting at age 50. Hepatitis C blood test. Hepatitis B blood test. Sexually transmitted disease (STD) testing. Diabetes screening. This is done by checking your blood sugar (glucose) after you have not eaten for a while (fasting). You may have this done every 1-3 years. Bone density scan. This is done to screen for osteoporosis. You may have this done starting at age 19. Mammogram. This may be done every 1-2 years. Talk to your health care provider about how often you should have regular mammograms. Talk with your health care provider about your test results, treatment options, and if necessary, the need for more tests. Vaccines  Your health care provider may recommend certain vaccines, such as: Influenza vaccine. This is recommended every year. Tetanus, diphtheria, and acellular pertussis (Tdap, Td) vaccine. You may need a Td booster every 10 years. Zoster vaccine. You may need this after age 23. Pneumococcal 13-valent conjugate (PCV13) vaccine. One dose is recommended after age 42. Pneumococcal polysaccharide (PPSV23) vaccine. One dose is recommended after age 80. Talk to your health care provider about which screenings and vaccines you need and how often you need them. This information is not intended to replace advice given to you by  your health care provider. Make sure you discuss any questions you have with your health care provider. Document Released: 07/16/2015 Document Revised: 03/08/2016 Document Reviewed: 04/20/2015 Elsevier Interactive Patient Education  2017 [employer].  Fall Prevention in the Home Falls can cause injuries. They can happen to people of all ages. There are many things you can do to make your home safe and to help prevent falls. What can I do on the outside of my home? Regularly fix the edges of walkways and driveways and fix any cracks. Remove anything that might make you trip as you walk through a door, such as a raised step or threshold. Trim any bushes or trees on the path to your home. Use bright outdoor lighting. Clear any walking paths of anything that might make someone trip, such as rocks or tools. Regularly check to see if handrails are loose or broken. Make sure that both sides of any steps have handrails. Any raised decks and porches should have  guardrails on the edges. Have any leaves, snow, or ice cleared regularly. Use sand or salt on walking paths during winter. Clean up any spills in your garage right away. This includes oil or grease spills. What can I do in the bathroom? Use night lights. Install grab bars by the toilet and in the tub and shower. Do not use towel bars as grab bars. Use non-skid mats or decals in the tub or shower. If you need to sit down in the shower, use a plastic, non-slip stool. Keep the floor dry. Clean up any water that spills on the floor as soon as it happens. Remove soap buildup in the tub or shower regularly. Attach bath mats securely with double-sided non-slip rug tape. Do not have throw rugs and other things on the floor that can make you trip. What can I do in the bedroom? Use night lights. Make sure that you have a light by your bed that is easy to reach. Do not use any sheets or blankets that are too big for your bed. They should not hang  down onto the floor. Have a firm chair that has side arms. You can use this for support while you get dressed. Do not have throw rugs and other things on the floor that can make you trip. What can I do in the kitchen? Clean up any spills right away. Avoid walking on wet floors. Keep items that you use a lot in easy-to-reach places. If you need to reach something above you, use a strong step stool that has a grab bar. Keep electrical cords out of the way. Do not use floor polish or wax that makes floors slippery. If you must use wax, use non-skid floor wax. Do not have throw rugs and other things on the floor that can make you trip. What can I do with my stairs? Do not leave any items on the stairs. Make sure that there are handrails on both sides of the stairs and use them. Fix handrails that are broken or loose. Make sure that handrails are as long as the stairways. Check any carpeting to make sure that it is firmly attached to the stairs. Fix any carpet that is loose or worn. Avoid having throw rugs at the top or bottom of the stairs. If you do have throw rugs, attach them to the floor with carpet tape. Make sure that you have a light switch at the top of the stairs and the bottom of the stairs. If you do not have them, ask someone to add them for you. What else can I do to help prevent falls? Wear shoes that: Do not have high heels. Have rubber bottoms. Are comfortable and fit you well. Are closed at the toe. Do not wear sandals. If you use a stepladder: Make sure that it is fully opened. Do not climb a closed stepladder. Make sure that both sides of the stepladder are locked into place. Ask someone to hold it for you, if possible. Clearly mark and make sure that you can see: Any grab bars or handrails. First and last steps. Where the edge of each step is. Use tools that help you move around (mobility aids) if they are needed. These  include: Canes. Walkers. Scooters. Crutches. Turn on the lights when you go into a dark area. Replace any light bulbs as soon as they burn out. Set up your furniture so you have a clear path. Avoid moving your furniture around. If any of your  floors are uneven, fix them. If there are any pets around you, be aware of where they are. Review your medicines with your doctor. Some medicines can make you feel dizzy. This can increase your chance of falling. Ask your doctor what other things that you can do to help prevent falls. This information is not intended to replace advice given to you by your health care provider. Make sure you discuss any questions you have with your health care provider. Document Released: 04/15/2009 Document Revised: 11/25/2015 Document Reviewed: 07/24/2014 Elsevier Interactive Patient Education  2017 [employer].

## 2023-01-18 ENCOUNTER — Telehealth: Payer: Self-pay | Admitting: Pharmacist

## 2023-01-18 ENCOUNTER — Ambulatory Visit: Payer: Medicare Other | Admitting: Internal Medicine

## 2023-01-18 ENCOUNTER — Encounter: Payer: Self-pay | Admitting: Internal Medicine

## 2023-01-18 VITALS — BP 120/60 | HR 67 | Temp 98.8°F | Ht 66.5 in | Wt 218.6 lb

## 2023-01-18 DIAGNOSIS — I2723 Pulmonary hypertension due to lung diseases and hypoxia: Secondary | ICD-10-CM | POA: Diagnosis not present

## 2023-01-18 DIAGNOSIS — J9611 Chronic respiratory failure with hypoxia: Secondary | ICD-10-CM

## 2023-01-18 DIAGNOSIS — E669 Obesity, unspecified: Secondary | ICD-10-CM

## 2023-01-18 DIAGNOSIS — J679 Hypersensitivity pneumonitis due to unspecified organic dust: Secondary | ICD-10-CM | POA: Diagnosis not present

## 2023-01-18 DIAGNOSIS — J849 Interstitial pulmonary disease, unspecified: Secondary | ICD-10-CM

## 2023-01-18 DIAGNOSIS — I5032 Chronic diastolic (congestive) heart failure: Secondary | ICD-10-CM

## 2023-01-18 MED ORDER — MOUNJARO 10 MG/0.5ML ~~LOC~~ SOAJ: 10.0000 mg | SUBCUTANEOUS | 0 refills | Status: DC

## 2023-01-18 NOTE — Patient Instructions (Addendum)
ICD-10-CM   1. Chronic respiratory failure with hypoxia (HCC)  J96.11     2. Hypersensitivity pneumonia (HCC)  J67.9     3. ILD (interstitial lung disease) (HCC)  J84.9     4. WHO group 3 pulmonary arterial hypertension (HCC)  I27.23     5. Chronic diastolic CHF (congestive heart failure) (HCC)  I50.32     6. Obesity (BMI 30.0-34.9)  E66.9      #Interstitial lung disease/hypersensitive pneumonitis  -I believe that he has some amount of mild ILD based on the upper lobe infiltrates, Aspergillus antibody positive but this is stable over time -In the past CCP antibody positive  Plan - Took shared decision making not to do steroids or other immunosuppressants given stability and side effect profile of the medications -Do repeat spirometry and DLCO in 9 months - 6 months -Monitor joint for any development of rheumatoid arthritis  #Pulmonary hypertension -postcapillary in July 2023 but precapillary in October 2023 after diuresis   -Probably multifactorial associated with the mild ILD, obesity and sleep apnea  Plan   - After shared decision not to do TAVR also  #Chronic hypoxemic respiratory failure  -Stable on pulmonary function test and CT and oxygen  Plan - Continue baseline 3 L oxygen  Obesity  - congrasts on 15# wiegh tloss with Mounjaro  Plan  - weight loss to continue; primary strategy now  April 2024  - 6-9 months but after pulmonary function test; 15-minute visit

## 2023-01-18 NOTE — Telephone Encounter (Signed)
Patient called and LVM on my machine that she was doing well on Mounjaro 7.5mg  and wanted to increase. Requested that Rx be sent to pharmacy. Rx for [facility] 10mg  sent to [facility].

## 2023-01-18 NOTE — Addendum Note (Signed)
Addended by: Delrae Rend on: 01/18/2023 10:25 AM   Modules accepted: Orders

## 2023-01-18 NOTE — Progress Notes (Signed)
IOV 12/05/2016  Chief Complaint  Patient presents with   PULMONARY CONSULT    PULMONARY CONSULT FOR SOB referring doctor is Dr. Berniece Andreas. DME is [facility] patient sleeps 8 hours a night with her CPAP machine     77 year old obese femal. She was admitted 11/12/2016 through 11/15/2016 for acute on chronic diastolic congestive heart failure. She is known to have sleep apnea essential hypertension coronary artery disease status post PTCA, paroxysmal A. fib, type 2 diabetes. She tells me that around 6 years ago she was diagnosed to have sleep apnea and started on CPAP therapy. Around this time she developed insidious onset of shortness of breath. Since then it has been progressively has been present on exertion relieved by rest. Class II-III activities bring it on. There is associated cough for the last 3 years. Both symptoms are progressive and is currently moderate in severity. Cough quality is dry. There is no associated chest pain or any radiation. There is no associated wheezing or orthopnea. Dyspnea can get worse during episodes of heart failure or atrial fibrillation. She's been on amiodarone therapy for the last 3 months after failing Tikosyn. She status post ablation. After the admission in May 2018 for heart failure that is concerned that she might have interstitial lung disease that she's been referred here. She feels currently well diuresed   Last chest x-ray 11/14/2016: Personally visualized and it looks like she has interstitial markings as opposed to heart failure   Echocardiogram April 2018: Pulmonary artery systolic pressure of 53 mmHg associated with grade 2 diastolic dysfunction  OV 02/22/2017  Chief Complaint  Patient presents with   Follow-up    Pt here after HRCT. Pt [place] and states she wasnt SOB at all but when she returned home she was more SOB. Pt denies cough and CP/tightness.     Follow-up chronic diastolic heart failure and  amiodarone therapy with? Of interstitial lung disease   Last visit June 2018 there was question of interstitial lung disease therefore we did a high-resolution CT chest with thoracic radiology believes that the findings are consistent with pulmonary edema. Therefore I held off on interstitial lung disease workup. In the interim she has lost 7 pounds. She went to the mountains and was active and return on the last week and she says that her dyspnea is actually better. She continues to be on 80 mg Lasix in the morning with 40 mg Lasix at night on her stable dose for several months. Because of the weight loss is actually feeling better although after return to [place] on lower altitude she says some of the shortness of breath is better but she still better than before. Walking desaturation test 185 feet 3 laps on room air: She walk only one lap and stopped due to knee pain she is upcoming knee replacement. A pulse ox did go down to 93% but it was still adequate.    IMPRESSION: 1. Cardiomegaly. Diffuse interlobular septal thickening, parahilar ground-glass attenuation and upper lung predominant mild patchy consolidation. These findings are most compatible with pulmonary edema due to congestive heart failure. 2. No specific findings of interstitial lung disease. No hyperdense foci of consolidation in the lungs specific for amiodarone pulmonary toxicity. 3. Mild to moderate mediastinal and mild bilateral hilar lymphadenopathy, stable since 09/25/2016, suggesting reactive/congestive adenopathy. 4. Three-vessel coronary atherosclerosis.   Aortic Atherosclerosis (ICD10-I70.0).     Electronically Signed   By: Tomasa Hose  Poff M.D.   On: 01/18/2017 16:41     OV 04/05/2017  Chief Complaint  Patient presents with   Follow-up    Pt states that she went to the mountains this past weekend 9/29-9/30 and breathing was a little labored then. Pt is working on losing weight which is helping with her  breathing. Still becomes SOB on exertion and c/o sinus drainage. Denies any cough or CP.    259# -> 252# -> 245# with [place]   OCt 2020 tele visit    Today's tele-visit is a follow-up for sleep apnea.  She was last seen in 2018. Patient says she has been doing well on CPAP.  Says she never misses a night.  She wears it all night for at least 8 to 9 hours.  She feels rested with no significant daytime sleepiness.  States her mask is working well.  She does need order for new CPAP supplies. She is trying to lose weight but has not been as active with the COVID 19 virus  CPAP download shows excellent compliance with 100% usage.  She uses daily 9 hours.  She is on CPAP 13 cm H2O.  AHI 0.6.  Inpatient consult Nov 2021   Background Pulm History  - Obese lady.  With coronary artery disease that is post PTCA, A. fib, type 2 diabetes and hypertension. .  Used to be followed by Dr Diona Foley - [id number] for OSA (AHI 21/h in 2010)  and CPAP for which back then good compliance was reported.  Then in May 2018 2018 she she had an admission for diastolic heart failure.  In the aftermath of that she continues to have dyspnea and exertional desaturation in the setting of amiodarone therapy and there was concern that she had interstitial lung disease and was seen in the pulmonary clinic.  However radiology felt the CT scan was most consistent with heart failure.  With subsequent diuresis at follow-up in October 2018 she was improved though there was still crackles and there is clinical concern for ILD.  She was referred for heart catheterization and underwent this in November 2018.  Her V waves are prominent.  Her mean pulmonary pressure was elevated at 48 mm per mercury and a high cardiac output of 8.5 L/min with only minimally elevated pulmonary vascular resistance of 3 Wood units.  Dr. Excell Seltzer recommended pulmonary vein isolation to rule out pulmonary vein stenosis.  In addition patient also had single-vessel  coronary artery disease but with continued patency of the stented segment in the right coronary artery.   After that she was lost to pulmonary follow-up.  Review of the chart indicate atrial fibrillation has been a problem and an ablation has been planned by Dr. Elberta Fortis in January 2022.  She appears to no longer be on amiodarone.?  Stop date   She really presents now on May 19, 2020.  She tells at baseline she was not using oxygen at home.  She was exposed to her husband was sick with nonproductive cough.  Then she developed pain in her left ear.  She took outpatient antibiotics but did not respond she got progressively short of breath and got admitted to the hospital.  He had Covid and flu test have been negative.  She has had a Covid vaccine.  She required facemask and nasal cannula oxygen.  Chest x-ray just shows hyperinflation.  She had a CT chest without contrast [not a high-resolution CT chest] shows groundglass opacities in the upper lobes but otherwise  no presence of chronic ILD.   She is on chronic eliquids   Pulmonary has been consulted    Dc summar 06/02/20  Acute hypoxemic respiratory failure/acute on chronic diastolic heart failure /Pulmonary hypertension Echocardiogram with preserved LV systolic function EF 55 to 60%, elevated PA pressure 63 mmHg systolic.  Patient was seen by cardiology.  Patient was placed on diuretics with good diuresis.  Her oxygen requirements have improved.  She is now saturating normal on room air.  She is ambulated without any difficulty.     Atrial fibrillation with rapid ventricular response The patient has converted to 2:1 flutter, rate in the 70's.  Seen by cardiology.  Will be discharged on beta-blocker diltiazem and dofetilide.  Continue apixaban.  She has an appointment with cardiology in January to consider ablation.      RSV bronchitis/ viral pneumonia (present on admission) Supportive care was provided.  Due to wheezing she was started on  steroids with improvement.  She will be discharged on tapering doses.     Hyperglycemia due to steroids HbA1c noted to be 8.1.  Patient not on any glucose lowering agents at home.  This will need further evaluation in the outpatient setting.   Delirium/Sundowning Patient with hallucinations.  Likely due to medications and hospital stay.  Resolved.    Hyperlipidemia Continue with atorvastatin.    CKD II Creatinine noted to be 1.57 today.  She has good urine output.  Will recommend renal function be checked in the outpatient setting at follow-up..    Depression Cntinue with duloxetine  OV 09/21/2020  Subjective:  Patient ID: [name], female , DOB: 1945/10/17 , age 56 y.o. , MRN: [id number] , ADDRESS: [address] [place] [address] PCP [name], Neta Mends, MD Patient Care Team: Madelin Headings, MD as PCP - General Elberta Fortis, Andree Coss, MD as PCP - Electrophysiology (Cardiology) Nahser, Deloris Ping, MD as PCP - Cardiology (Cardiology) Ollen Gross, MD as Consulting Physician (Orthopedic Surgery) Kalman Shan, MD as Consulting Physician (Pulmonary Disease) Lutricia Feil, [employer] [facility] [employer] as Pharmacist (Pharmacist)  This Provider for this visit: Treatment Team:  Attending Provider: Kalman Shan, MD    09/21/2020 -   Chief Complaint  Patient presents with   Follow-up    Hospital follow up-Shortness of breath with activity     Adi C Hawn 77 y.o. -post hospital follow-up for acute respiratory failure because of diastolic dysfunction and RSV bronchopneumonia in #2021.  She was extremely hypoxemic requiring high flow nasal cannula.  She required lot of diuresis and time.  She was discharged without oxygen.  Currently she says she is compliant with the CPAP without oxygen.  She is not needing any oxygen at rest or with exertion.  She has class II dyspnea on exertion relieved by rest no chest pain.  She feels his dyspnea is baseline prehospitalization and is related to  obesity.  Overall she is feeling good.  No orthopnea.  No proximal nocturnal dyspnea.  No cough.  No wheezing.   [facility] Patient presents today for hospital follow-up.  Patient was admitted from 12/10/2021 - 12/16/2021 for acute hypoxic respiratory failure secondary to acute on chronic diastolic heart failure, pulmonary fibrosis and presumed OSA/OHS.  Patient had questionable severe PAH noted on right heart cath in 2018.  No mention of pulmonary hypertension on patient's 2D echo at this time.  She was diuresed 7 L, still requiring 2 L supplemental oxygen.  CT chest from April 2022 concerning for possible pulmonary fibrosis  confirmed on high-resolution CT.  Patient had RSV in 2020 and has seen Dr. Marchelle Gearing outpatient.  She will continue on torsemide, supplemental oxygen and CPAP at night.             Respiratory failure multifactorial due to CHF, pulmonary fibrosis and OSA.  She has abnormal groundglass opacities on HRCT from 6/13.  This is not significantly changed from prior scans over the past couple years.  Felt to clearly resemble pulmonary fibrosis.  No role for steroids or antibiotics at this time.  Recommend repeating serology.  She is feeling better since discharged She has noticed increased swelling in her feet/ankles and hands She is following with HF clinic, seen on Monday  She is on Torsemide 40mg  daily  Planning for heart cath in July with cardiology   TESTING: Echo 6/10 > LVEF 60-65%, Normal valves. RV, LV appear normal.  HRCT 6/13 independently reviewed shows scattered groundglass opacities bilateral consistent with multifocal inflammation and unchanged prior appearance of septal thickening and mosaic attenuation with lobular air trapping on expiration.  Interestingly nodular cirrhotic morphology of the liver was noted 06/2020 CT cors/pulmonary vein >> atypical left pulmonary vein drainage into left atrium   RHC July 2023  Right Heart Pressures [facility] Procedural  Findings: Hemodynamics (mmHg) RA mean 14 RV 68/18 PA 63/24, mean 45 PCWP mean 25 with v-waves to 35 (prominent)  Oxygen saturations: PA 71% AO 92%  Cardiac Output (Fick) 7.7  Cardiac Index (Fick) 3.5 PVR 2.6 WU  PAPi 2.8    OV 04/04/2022  Subjective:  Patient ID: Lurline Hare, female , DOB: 1946/04/24 , age 31 y.o. , MRN: [id number] , ADDRESS: [address] [place] [address] PCP [name], Neta Mends, MD Patient Care Team: Madelin Headings, MD as PCP - General (Internal Medicine) Regan Lemming, MD as PCP - Electrophysiology (Cardiology) Meriam Sprague, MD as PCP - Cardiology (Cardiology) Ollen Gross, MD as Consulting Physician (Orthopedic Surgery) Kalman Shan, MD as Consulting Physician (Pulmonary Disease) Verner Chol, [facility] as Pharmacist (Pharmacist)  This Provider for this visit: Treatment Team:  Attending Provider: Kalman Shan, MD    04/04/2022 -   Chief Complaint  Patient presents with   Follow-up    Breathing is much improved. She has minimal, non prod cough at night.      HPI Channon C Bakula 77 y.o. -morbidly obese lady who I have followed intermittently since 2018.  She is here with her husband who I am meeting for the first time.  Husband is a Product/process development scientist and does work for Dr. Brynda Peon.  Followed intermittently since 2018.  Back in 2018 there was question of interstitial lung disease but radiology felt findings on CT scan were more consistent with heart failure.  She is known to have obesity, diastolic dysfunction, pulmonary venous hypertension with prominent V waves and sleep apnea on CPAP.  However in 2021 course was complicated by RSV pneumonia and respiratory failure on admission.  In this admission 2 she required a lot of diuresis.  Then I last saw her in March 2022 after that not seen her for follow-up.  She then got hospitalized in June 2023 with similar admission.  Review of the notes indicate significant acute on  chronic diastolic heart failure requiring significant amount of diuresis but also concern for associated interstitial lung disease.  After diuresis she is significantly improved but still was discharged on approximately 3 L of oxygen.  She says that right now when she walks  10 feet on room air she will get dyspneic and wear the oxygen although she is not sure if she desaturates.  Walking oxygen desaturation test on room air she got dyspneic walking just 2 out of 3 laps but did not desaturate below 92%.  Overall she does feel better.    Since her admission she has followed up with nurse practitioner who did a serology profile which was negative but hypersensitivity pneumonitis profile is positive for Aspergillus Antibody.  With the CT findings of groundglass opacities concern is for hypersensitive pneumonitis.  However I repeatedly asked her whether she had any birds or down jacket or down pillow or down sulfa or any mildew exposure during the course of her life or any mold exposure.  She denied all this.  She is worked as a Production designer, theatre/television/film in the   but has been in different buildings but does not recall any mold or musty smell.  This also includes a home.     OV 07/10/2022  Subjective:  Patient ID: Lurline Hare, female , DOB: 02-20-1946 , age 33 y.o. , MRN: [id number] , ADDRESS: [address] [place] [address] PCP [name], Neta Mends, MD Patient Care Team: Madelin Headings, MD as PCP - General (Internal Medicine) Regan Lemming, MD as PCP - Electrophysiology (Cardiology) Meriam Sprague, MD as PCP - Cardiology (Cardiology) Ollen Gross, MD as Consulting Physician (Orthopedic Surgery) Kalman Shan, MD as Consulting Physician (Pulmonary Disease) Verner Chol, [facility] as Pharmacist (Pharmacist) Lennette Bihari, MD as Consulting Physician (Cardiology)  This Provider for this visit: Treatment Team:  Attending Provider: Kalman Shan, MD    07/10/2022 -   Chief Complaint   Patient presents with   Follow-up     HPI [name] 77 y.o. -returns for follow-up.  After the last visit she underwent right heart catheterization after he spoke to Dr. Shari Prows.  Her pulmonary wedge pressure improved significantly to 16 with a mean pulmonary artery pressures 35 mmHg.  This information was obtained by review of the external records.  She presents with her husband who feels that she is currently stable.  At this point in time she still uses 3 L oxygen.  She is stable.  In October 2023 she did have pulmonary function test and this is stable.  I personally visualized the CT scan and also showed it to her.  Showed a possible groundglass opacities versus air trapping.  We discussed treatment for pulmonary hypertension but because she is doing well she and her husband felt this is best that she just maintain stability with the current treatment regimen.  We did discuss inhaled treprostinil and the side effects.  Currently she does not have much cough.  She only has dyspnea on exertion class III so she will just wants expectant follow-up.  She did complain of dizziness when she bends down and I advised her not to crunch her diaphragm.  They are willing for repeat CT scan and pulmonary function test.       OV 01/18/2023  Subjective:  Patient ID: Lurline Hare, female , DOB: 11/20/1945 , age 66 y.o. , MRN: [id number] , ADDRESS: [address] [place] [address] PCP [name], Neta Mends, MD Patient Care Team: Madelin Headings, MD as PCP - General (Internal Medicine) Regan Lemming, MD as PCP - Electrophysiology (Cardiology) Meriam Sprague, MD as PCP - Cardiology (Cardiology) Ollen Gross, MD as Consulting Physician (Orthopedic Surgery) Kalman Shan, MD as Consulting Physician (Pulmonary  Disease) Lennette Bihari, MD as Consulting Physician (Cardiology) Sherrill Raring, [facility] (Inactive) (Pharmacist)  This Provider for this visit: Treatment Team:  Attending  Provider: Kalman Shan, MD  #Multifactorial chronic hypoxic respiratory failure  -Possible ILD with hypersensitive pneumonitis [CT scan in 2023 showed air trapping with possible groundglass and serum hypersensitivity Aspergillus profile positive but no known exposures]  -Mildly elevated CCP antibody June 2023 and November 2021 but otherwise serology negative  -ANA +1: 80 in 2020 and 2019  -Elevated IgE 302 and November 2023  -Positive Aspergillus fumigatus antibody June 2023  -Pulmonary hypertension   -Venous congestion with elevated wedge in summer 2023 followed by euvolemia but still persistent elevated mean pulmonary pressure in the 30s in October 2023   - July 2023: PA Meam 45, PCWP 25 with v waver to 35, PV 2.6, CI 3.5   - OCt 2023: PA mean 35, PCWP 16, PVR 2.97, CI 2.99 -Obesity  -Morbid and started on [facility] June 2023   Sleep  apnea on CPAP followed by Dr. Maple Hudson  01/18/2023 -   Chief Complaint  Patient presents with   Follow-up    No problems.  Breathing at baseline.     HPI Amal C Pilkington 77 y.o. -returns for follow-up.  Presents with her husband.  Overall she reports stability in health.  Since her last visit she started [facility] and she is lost 15 pounds of weight.  She is happy about this.  She and her husband attest that weight loss is the primary goal.  Husband is an independent historian.  She had pulmonary function test and this shows stability over few years.  She had CT scan of the chest that I personally reviewed and this also shows stability.  I believe she has ILD.  She did have crackles on the exam in the upper lobe.  Features seem to be more consistent with hypertensive pneumonitis even though she does have some autoimmune antibodies positive.  In any event she is stable.  We again discussed antifibrotic's and we took a shared decision making just to monitor because she is stable.   SYMPTOM SCALE - ILD 04/04/2022 01/18/2023   Current weight    O2 use 3L at  home   Shortness of Breath 0 -> 5 scale with 5 being worst (score 6 If unable to do)   At rest 5   Simple tasks - showers, clothes change, eating, shaving 3   Household (dishes, doing bed, laundry) 2   Shopping 2   Walking level at own pace 4   Walking up Stairs 2   Total (30-36) Dyspnea Score 18   How bad is your cough? 4   How bad is your fatigue 4   How bad is nausea 0   How bad is vomiting?  0   How bad is diarrhea? 2   How bad is anxiety? 3   How bad is depression 2   Any chronic pain - if so where and how bad x        Simple office walk 185 feet x  3 laps goal with forehead probe 09/21/2020  04/04/2022  01/18/2023   O2 used ra Ra walk (uses 3L o2 at [employer]) - IMPROVED   Number laps completed avg Only 2 of 3 and stopped due to fatigue, back pain and dyspnea   Comments about pace 3 lap 2   Resting Pulse Ox/HR 94% and 66/min 92% ANd HR 61   Final Pulse Ox/HR  91% and 90/min 92% and HR 73   Desaturated </= 88% no no   Desaturated <= 3% points yes no   Got Tachycardic >/= 90/min yes no   Symptoms at end of test soe dyspnea Dyspnea, back pain   Miscellaneous comments x DID NOT deags     Right Heart Pressures R[facility] Procedural Findings: Hemodynamics (mmHg) - OCT 2023 RA mean 4 RV 49/7 PA 50/24, mean 35 PCWP mean 16  Oxygen saturations: PA 74% AO 95%  Cardiac Output (Fick) 6.4  Cardiac Index (Fick) 2.99 PVR 2.97 WU    CT Chest data from date:   - personally visualized and independently interpreted : April 2024 - my findings are: STABLE agree with the concern of HP   Narrative & Impression  CLINICAL DATA:  Chronic respiratory failure   EXAM: CT CHEST WITHOUT CONTRAST   TECHNIQUE: Multidetector CT imaging of the chest was performed following the standard protocol without intravenous contrast. High resolution imaging of the lungs, as well as inspiratory and expiratory imaging, was performed.   RADIATION DOSE REDUCTION: This exam was performed according to  the departmental dose-optimization program which includes automated exposure control, adjustment of the mA and/or kV according to patient size and/or use of iterative reconstruction technique.   COMPARISON:  Multiple priors, most recent chest CT dated March 13, 2022   FINDINGS: Cardiovascular: Normal heart size. No pericardial effusion. Normal caliber thoracic aorta with moderate atherosclerotic disease. Severe coronary artery calcifications. Chest wall dual lead pacer with leads in the right atrium right ventricle.   Mediastinum/Nodes: Esophagus and thyroid are unremarkable. Stable mildly enlarged precarinal lymph node measuring 10 mm in short axis on series 2, image 53.   Lungs/Pleura: Central airways are patent. Mosaic attenuation with evidence of air trapping on expiratory phase imaging. Mild reticular and ground-glass opacities of the bilateral upper lobes, unchanged when compared with the prior exam. Solid pulmonary nodule of the right lower lobe measuring 6 mm on series 7, image 12, stable when compared with prior exams dating back to October 08, 2020 exam, no specific follow-up imaging is necessary given greater than 1 year stability. No pleural effusion pneumothorax.   Upper Abdomen: Nodular liver contour.  Prior cholecystectomy.   Musculoskeletal: No chest wall mass or suspicious bone lesions identified.   IMPRESSION: 1. Mosaic attenuation and air trapping, similar to prior exams, findings can be seen in the setting of small airways disease. 2. Unchanged mild reticular and ground-glass opacities of the bilateral upper lobes, possibly scarring related to prior infectious or inflammatory process. Interstitial lung disease is an additional consideration, hypersensitivity pneumonitis would be the primary differential given upper lobe predominance and air trapping. Findings are suggestive of an alternative diagnosis (not UIP) per consensus guidelines: Diagnosis of  Idiopathic Pulmonary Fibrosis: An Official ATS/ERS/JRS/ALAT Clinical Practice Guideline. Am Rosezetta Schlatter Crit Care Med Vol 198, Iss 5, [phone], Mar 03 2017. 3. Nodular liver contour, finding can be seen in the setting of cirrhosis. 4. Coronary artery calcifications and aortic atherosclerosis (ICD10-I70.0).     Electronically Signed   By: Allegra Lai M.D.   On: 10/17/2022 20:45         Latest Ref Rng & Units 01/10/2023    4:44 PM 04/14/2022   10:52 AM 11/03/2016   11:34 AM  PFT Results  FVC-Pre L  1.49  1.38   FVC-Predicted Pre % 42  P 48  41   FVC-Post L 1.42  P 1.53  1.55   FVC-Predicted Post %  46  P 49  47   Pre FEV1/FVC % % 86  P 83  81   Post FEV1/FCV % % 86  P 87  85   FEV1-Pre L 1.12  P 1.25  1.12   FEV1-Predicted Pre % 48  P 53  44   FEV1-Post L 1.22  P 1.33  1.32   DLCO uncorrected ml/min/mmHg 15.37  P 15.83  14.71   DLCO UNC% % 74  P 76  53   DLCO corrected ml/min/mmHg 15.37  P 14.91  14.06   DLCO COR %Predicted % 74  P 72  50   DLVA Predicted % 127  P 135  88   TLC L 4.41  P 4.34  3.76   TLC % Predicted % 81  P 79  69   RV % Predicted % 110  P 111  103     P Preliminary result     LAB RESULTS last 96 hours No results found.  LAB RESULTS last 90 days Recent Results (from the past 2160 hour(s))  Lipid Profile     Status: Abnormal   Collection Time: 10/23/22  9:00 AM  Result Value Ref Range   Cholesterol, Total 104 100 - 199 mg/dL   Triglycerides 604 (H) 0 - 149 mg/dL   HDL 33 (L) >54 mg/dL   VLDL Cholesterol Cal 29 5 - 40 mg/dL   LDL Chol Calc (NIH) 42 0 - 99 mg/dL   Chol/HDL Ratio 3.2 0.0 - 4.4 ratio    Comment:                                   T. Chol/HDL Ratio                                             Men  Women                               1/2 Avg.Risk  3.4    3.3                                   Avg.Risk  5.0    4.4                                2X Avg.Risk  9.6    7.1                                3X Avg.Risk 23.4   11.0   Pulmonary  function test     Status: None (Preliminary result)   Collection Time: 01/10/23  4:44 PM  Result Value Ref Range   FVC-%Pred-Pre 42 %   FVC-Post 1.42 L   FVC-%Pred-Post 46 %   FVC-%Change-Post 8 %   FEV1-Pre 1.12 L   FEV1-%Pred-Pre 48 %   FEV1-Post 1.22 L   FEV1-%Pred-Post 52 %   FEV1-%Change-Post 8 %   FEV6-Pre 1.31 L   FEV6-%Pred-Pre 44 %   FEV6-Post 1.42 L   FEV6-%Pred-Post 48 %   FEV6-%Change-Post 8 %  Pre FEV1/FVC ratio 86 %   FEV1FVC-%Pred-Pre 114 %   Post FEV1/FVC ratio 86 %   FEV1FVC-%Change-Post 0 %   Pre FEV6/FVC Ratio 100 %   FEV6FVC-%Pred-Pre 105 %   Post FEV6/FVC ratio 100 %   FEV6FVC-%Pred-Post 105 %   FEF 25-75 Pre 1.45 L/sec   FEF2575-%Pred-Pre 82 %   FEF 25-75 Post 2.14 L/sec   FEF2575-%Pred-Post 121 %   FEF2575-%Change-Post 47 %   RV 2.70 L   RV % pred 110 %   TLC 4.41 L   TLC % pred 81 %   DLCO unc 15.37 ml/min/mmHg   DLCO unc % pred 74 %   DLCO cor 15.37 ml/min/mmHg   DLCO cor % pred 74 %   DL/VA 1.30 ml/min/mmHg/L   DL/VA % pred 865 %         has a past medical history of Anticoagulant long-term use, Anxiety, Arthritis, CAD (coronary artery disease) (7846,9629), CHF (congestive heart failure) (HCC), Chronic atrial fibrillation (HCC) (06/2007), Chronic kidney disease, CVA (cerebral vascular accident) (HCC) (5284,1324), Depression, Diplopia (06/19/2008), Dysrhythmia, Edema of lower extremity, Hyperlipidemia, Hypertension, Inferior myocardial infarction [facility]), Myocardial infarction (HCC) (4010,2725), Obesity, OSA on CPAP, Pacemaker, Pneumonia (2014), Pulmonary hypertension (HCC), Shortness of breath, Skin cancer, Sleep apnea, Spondylolisthesis, TIA (transient ischemic attack) (2008), and Unspecified hemorrhoids without mention of complication (07/15/2003).   reports that she quit smoking about 40 years ago. Her smoking use included cigarettes. She started smoking about 44 years ago. She has a 5 pack-year smoking history. She has never used smokeless  tobacco.  Past Surgical History:  Procedure Laterality Date   ABDOMINAL HYSTERECTOMY     APPENDECTOMY  1984   ATRIAL FIBRILLATION ABLATION N/A 09/29/2016   Procedure: Atrial Fibrillation Ablation;  Surgeon: Will Jorja Loa, MD;  [facility]: [facility];  Service: Cardiovascular;  Laterality: N/A;   ATRIAL FIBRILLATION ABLATION N/A 02/07/2018   Procedure: ATRIAL FIBRILLATION ABLATION;  Surgeon: Regan Lemming, MD;  Location: [facility];  Service: Cardiovascular;  Laterality: N/A;   ATRIAL FIBRILLATION ABLATION N/A 03/13/2019   Procedure: ATRIAL FIBRILLATION ABLATION;  Surgeon: Regan Lemming, MD;  [facility]: [facility];  Service: Cardiovascular;  Laterality: N/A;   ATRIAL FIBRILLATION ABLATION N/A 07/07/2020   Procedure: ATRIAL FIBRILLATION ABLATION;  Surgeon: Regan Lemming, MD;  [facility];  Service: Cardiovascular;  Laterality: N/A;   BACK SURGERY     CARDIOVERSION N/A 09/12/2017   Procedure: CARDIOVERSION;  Surgeon: Jake Bathe, MD;  Location: [facility] ENDOSCOPY;  Service: Cardiovascular;  Laterality: N/A;   CARDIOVERSION N/A 12/13/2017   Procedure: CARDIOVERSION;  Surgeon: Thurmon Fair, MD;  Location: [facility] ENDOSCOPY;  Service: Cardiovascular;  Laterality: N/A;   CARDIOVERSION N/A 02/18/2018   Procedure: CARDIOVERSION;  Surgeon: Lars Masson, MD;  Location: [facility] ENDOSCOPY;  Service: Cardiovascular;  Laterality: N/A;   CARDIOVERSION N/A 05/20/2018   Procedure: CARDIOVERSION;  Surgeon: Quintella Reichert, MD;  Location: [facility] ENDOSCOPY;  Service: Cardiovascular;  Laterality: N/A;   CARDIOVERSION N/A 04/16/2019   Procedure: CARDIOVERSION;  Surgeon: Little Ishikawa, MD;  Location: [facility] ENDOSCOPY;  Service: Endoscopy;  Laterality: N/A;   CARDIOVERSION N/A 01/22/2020   Procedure: CARDIOVERSION;  Surgeon: Sande Rives, MD;  Location: [facility] ENDOSCOPY;  Service: Cardiovascular;  Laterality: N/A;   CARDIOVERSION N/A 02/18/2020   Procedure:  CARDIOVERSION;  Surgeon: Parke Poisson, MD;  Location: [facility] ENDOSCOPY;  Service: Cardiovascular;  Laterality: N/A;   CATARACT EXTRACTION W/ INTRAOCULAR LENS  IMPLANT, BILATERAL Bilateral 01/15/2017- 03/2017   CHOLECYSTECTOMY     CORONARY ANGIOPLASTY  X 2   CORONARY ANGIOPLASTY WITH STENT PLACEMENT  1998; ~ 2007; ?date   "1 stent; replaced stent; not sure when I got the last stent" (04/23/2017)   DOPPLER ECHOCARDIOGRAPHY  2009   ELECTROPHYSIOLOGIC STUDY N/A 03/31/2016   Procedure: Cardioversion;  Surgeon: Marinus Maw, MD;  [facility];  Service: Cardiovascular;  Laterality: N/A;   ELECTROPHYSIOLOGIC STUDY N/A 08/04/2016   Procedure: Cardioversion;  Surgeon: Marinus Maw, MD;  Location: [facility] INVASIVE CV LAB;  Service: Cardiovascular;  Laterality: N/A;   INSERT / REPLACE / REMOVE PACEMAKER  06/2007   IR RADIOLOGY PERIPHERAL GUIDED IV START  01/31/2018   IR US GUIDE VASC ACCESS LEFT  01/31/2018   JOINT REPLACEMENT     LAPAROSCOPIC CHOLECYSTECTOMY  1994   LEFT AND RIGHT HEART CATHETERIZATION WITH CORONARY ANGIOGRAM N/A 10/06/2013   Procedure: LEFT AND RIGHT HEART CATHETERIZATION WITH CORONARY ANGIOGRAM;  Surgeon: Lennette Bihari, MD;  Location: [facility] CATH LAB;  Service: Cardiovascular;  Laterality: N/A;   LEFT OOPHORECTOMY Left ~ 1989   POSTERIOR LUMBAR FUSION  2000s - 04/2015 X 3   L3-4; L4-5; L2-3; Dr Dutch Quint   RIGHT HEART CATH N/A 01/04/2022   Procedure: RIGHT HEART CATH;  Surgeon: Laurey Morale, MD;  Location: [facility] INVASIVE CV LAB;  Service: Cardiovascular;  Laterality: N/A;   RIGHT HEART CATH N/A 05/02/2022   Procedure: RIGHT HEART CATH;  Surgeon: Laurey Morale, MD;  Location: [facility] INVASIVE CV LAB;  Service: Cardiovascular;  Laterality: N/A;   RIGHT/LEFT HEART CATH AND CORONARY ANGIOGRAPHY N/A 05/09/2017   Procedure: RIGHT/LEFT HEART CATH AND CORONARY ANGIOGRAPHY;  Surgeon: Tonny Bollman, MD;  Location: [facility] INVASIVE CV LAB;  Service: Cardiovascular;  Laterality: N/A;   SKIN CANCER  EXCISION Right    [place]   TEE WITHOUT CARDIOVERSION N/A 09/29/2016   Procedure: TRANSESOPHAGEAL ECHOCARDIOGRAM (TEE);  Surgeon: Jake Bathe, MD;  Location: [facility] ENDOSCOPY;  Service: Cardiovascular;  Laterality: N/A;   TOTAL ABDOMINAL HYSTERECTOMY  1984   "uterus & right ovary"   TOTAL KNEE ARTHROPLASTY Left 04/23/2017   TOTAL KNEE ARTHROPLASTY Left 04/23/2017   Procedure: TOTAL KNEE ARTHROPLASTY;  Surgeon: Gean Birchwood, MD;  Location: [facility] OR;  Service: Orthopedics;  Laterality: Left;   ULTRASOUND GUIDANCE FOR VASCULAR ACCESS  05/09/2017   Procedure: Ultrasound Guidance For Vascular Access;  Surgeon: Tonny Bollman, MD;  Location: [facility] INVASIVE CV LAB;  Service: Cardiovascular;;    Allergies  Allergen Reactions   Hydrocodone-Guaifenesin Other (See Comments)    Confusion/ memory loss   Adhesive [Tape] Rash and Other (See Comments)    Allergic to defibrillation pads.    Immunization History  Administered Date(s) Administered   Fluad Quad(high Dose 65+) 04/10/2019, 04/20/2021, 04/04/2022   Influenza Split 04/18/2011, 05/06/2012   Influenza Whole 07/04/2007, 06/02/2009, 03/01/2010   Influenza, High Dose Seasonal PF 04/03/2014, 04/18/2016, 04/05/2017, 05/06/2018   Influenza,inj,Quad PF,6+ Mos 03/07/2013, 04/06/2015   PFIZER Comirnaty(Gray Top)Covid-19 Tri-Sucrose Vaccine 04/05/2022   PFIZER(Purple Top)SARS-COV-2 Vaccination 07/22/2019, 08/10/2019, 03/29/2020   PNEUMOCOCCAL CONJUGATE-20 04/22/2021   Pfizer Covid-19 Vaccine Bivalent Booster 5yrs & up 04/22/2021   Pneumococcal Conjugate-13 09/24/2013, 04/20/2020   Pneumococcal Polysaccharide-23 07/03/2005, 07/04/2007, 04/18/2011, 04/05/2022   Respiratory Syncytial Virus Vaccine,Recomb Aduvanted(Arexvy) 04/05/2022   Td 06/02/2009   Zoster Recombinant(Shingrix) 04/09/2020, 10/24/2022   Zoster, Live 07/03/2006    Family History  Problem Relation Age of Onset   Suicidality Father  suicide death pt was 3 yrs, 52   Arrhythmia Mother     Hypertension Mother    Diabetes Mother    Dementia Mother    Heart attack Brother    Heart disease Paternal Aunt    Prostate cancer Maternal Grandfather    Diabetes Paternal Grandfather        fathers side of the family   Colon cancer Maternal Aunt      Current Outpatient Medications:    apixaban (ELIQUIS) 5 MG TABS tablet, Take 1 tablet (5 mg total) by mouth 2 (two) times daily., Disp: 180 tablet, Rfl: 1   atorvastatin (LIPITOR) 40 MG tablet, TAKE ONE TABLET BY MOUTH ONCE DAILY, Disp: 90 tablet, Rfl: 3   BD PEN NEEDLE NANO 2ND GEN 32G X 4 MM MISC, daily., Disp: , Rfl:    Continuous Glucose Receiver (DEXCOM G7 RECEIVER) DEVI, Use as directed while on insulin, Disp: 1 each, Rfl: 3   Continuous Glucose Sensor (DEXCOM G7 SENSOR) MISC, Apply new sensor every 10 days to monitor glucose. Remove old sensor before applying new one., Disp: 3 each, Rfl: 3   diltiazem (CARDIZEM LA) 360 MG 24 hr tablet, TAKE ONE TABLET BY MOUTH ONCE DAILY, Disp: 90 tablet, Rfl: 3   dofetilide (TIKOSYN) 250 MCG capsule, Take 1 capsule (250 mcg total) by mouth 2 (two) times daily. KEEP UPCOMING APPOINTMENT FOR FUTURE REFILLS., Disp: 180 capsule, Rfl: 3   DULoxetine (CYMBALTA) 60 MG capsule, TAKE ONE CAPSULE BY MOUTH ONCE DAILY, Disp: 90 capsule, Rfl: 3   empagliflozin (JARDIANCE) 25 MG TABS tablet, TAKE ONE TABLET BY MOUTH BEFORE BREAKFAST, Disp: 90 tablet, Rfl: 0   ezetimibe (ZETIA) 10 MG tablet, Take 1 tablet (10 mg total) by mouth daily., Disp: 90 tablet, Rfl: 2   fluticasone (FLONASE) 50 MCG/ACT nasal spray, Place 1 spray into both nostrils daily as needed for allergies., Disp: , Rfl:    ipratropium (ATROVENT) 0.06 % nasal spray, 1-2 puffs each nostril up to 4 times daily, Disp: 15 mL, Rfl: 12   loratadine (CLARITIN) 10 MG tablet, TAKE 1 TABLET BY MOUTH EVERY DAY, Disp: 90 tablet, Rfl: 0   metFORMIN (GLUCOPHAGE) 1000 MG tablet, TAKE ONE TABLET BY MOUTH TWICE DAILY, Disp: 180 tablet, Rfl: 3   metolazone  (ZAROXOLYN) 2.5 MG tablet, TAKE 1 TABLET BY MOUTH EVERY 4  DAYS AS NEEDED FOR SWELLING  PLEASE KEEP UPCOMING APPOINTMENT FURTHER REFILLS, Disp: 25 tablet, Rfl: 2   metoprolol tartrate (LOPRESSOR) 50 MG tablet, TAKE ONE TABLET BY MOUTH EVERY MORNING and TAKE TWO TABLETS BY MOUTH EVERY EVENING, Disp: 270 tablet, Rfl: 3   nitroGLYCERIN (NITROSTAT) 0.4 MG SL tablet, Place 1 tablet (0.4 mg total) under the tongue every 5 (five) minutes x 3 doses as needed for chest pain., Disp: 15 tablet, Rfl: 6   NON FORMULARY, CPAP at bedtime, Disp: , Rfl:    nystatin-triamcinolone (MYCOLOG II) cream, Apply 1 application topically 2 (two) times daily as needed., Disp: 30 g, Rfl: 1   omeprazole (PRILOSEC) 20 MG capsule, TAKE ONE CAPSULE BY MOUTH BEFORE BREAKFAST and TAKE ONE CAPSULE EVERYDAY AT BEDTIME, Disp: 60 capsule, Rfl: 3   Polyethyl Glycol-Propyl Glycol (SYSTANE) 0.4-0.3 % SOLN, Place 1 drop into both eyes daily as needed (dry eyes)., Disp: , Rfl:    potassium chloride SA (KLOR-CON M) 20 MEQ tablet, Take 1 tablet (20 mEq total) by mouth daily., Disp: 90 tablet, Rfl: 3   predniSONE (DELTASONE) 20 MG tablet, Take 1 tablet (20 mg  total) by mouth 2 (two) times daily with a meal. May raise your blood sugar temporarily, Disp: 10 tablet, Rfl: 0   spironolactone (ALDACTONE) 25 MG tablet, Take 0.5 tablets (12.5 mg total) by mouth daily., Disp: 45 tablet, Rfl: 3   tirzepatide (MOUNJARO) 7.5 MG/0.5ML Pen, Inject 7.5 mg into the skin once a week., Disp: 2 mL, Rfl: 11   torsemide (DEMADEX) 20 MG tablet, Take 3 tablets (60 mg total) by mouth daily., Disp: 270 tablet, Rfl: 3   TRESIBA FLEXTOUCH 100 UNIT/ML FlexTouch Pen, Inject 28 units into THE SKIN daily, Disp: 9 mL, Rfl: 3   valACYclovir (VALTREX) 1000 MG tablet, TAKE 2 TABLETS BY MOUTH EVERY 12 HOURS AS NEEDED, Disp: 34 tablet, Rfl: 2  Current Facility-Administered Medications:    sodium chloride flush (NS) 0.9 % injection 3 mL, 3 mL, Intravenous, Q12H, Gaston Islam.,  NP      Objective:   Vitals:   01/18/23 0949  BP: 120/60  Pulse: 67  Temp: 98.8 F (37.1 C)  TempSrc: Oral  SpO2: 93%  Weight: 218 lb 9.6 oz (99.2 kg)  Height: 5' 6.5" (1.689 m)    Estimated body mass index is 34.75 kg/m as calculated from the following:   Height as of this encounter: 5' 6.5" (1.689 m).   Weight as of this encounter: 218 lb 9.6 oz (99.2 kg).  @WEIGHTCHANGE @  [employer]   01/18/23 0949  Weight: 218 lb 9.6 oz (99.2 kg)     Physical Exam   General: No distress. Looks we O2 at rest: YES Cane present: no Sitting in wheel chair: no Frail: no Obese: YES Neuro: Alert and Oriented x 3. GCS 15. Speech normal Psych: Pleasant Resp:  Barrel Chest - no.  Wheeze - no, Crackles - YES BILATERAL UL, No overt respiratory distress CVS: Normal heart sounds. Murmurs - n Ext: Stigmata of Connective Tissue Disease - ono HEENT: Normal upper airway. PEERL +. No post nasal drip        Assessment:       ICD-10-CM   1. Chronic respiratory failure with hypoxia (HCC)  J96.11     2. Hypersensitivity pneumonia (HCC)  J67.9     3. ILD (interstitial lung disease) (HCC)  J84.9     4. WHO group 3 pulmonary arterial hypertension (HCC)  I27.23     5. Chronic diastolic CHF (congestive heart failure) (HCC)  I50.32     6. Obesity (BMI 30.0-34.9)  E66.9          Plan:     Patient Instructions     ICD-10-CM   1. Chronic respiratory failure with hypoxia (HCC)  J96.11     2. Hypersensitivity pneumonia (HCC)  J67.9     3. ILD (interstitial lung disease) (HCC)  J84.9     4. WHO group 3 pulmonary arterial hypertension (HCC)  I27.23     5. Chronic diastolic CHF (congestive heart failure) (HCC)  I50.32     6. Obesity (BMI 30.0-34.9)  E66.9      #Interstitial lung disease/hypersensitive pneumonitis  -I believe that he has some amount of mild ILD based on the upper lobe infiltrates, Aspergillus antibody positive but this is stable over time -In the past CCP  antibody positive  Plan - Took shared decision making not to do steroids or other immunosuppressants given stability and side effect profile of the medications -Do repeat spirometry and DLCO in 9 months - 6 months -Monitor joint for any development of rheumatoid arthritis  #  Pulmonary hypertension -postcapillary in July 2023 but precapillary in October 2023 after diuresis   -Probably multifactorial associated with the mild ILD, obesity and sleep apnea  Plan   - After shared decision not to do TAVR also  #Chronic hypoxemic respiratory failure  -Stable on pulmonary function test and CT and oxygen  Plan - Continue baseline 3 L oxygen  Obesity  - congrasts on 15# wiegh tloss with Mounjaro  Plan  - weight loss to continue; primary strategy now  April 2024  - 6-9 months but after pulmonary function test; 15-minute visit    FOLLOWUP Return in about 8 months (around 09/18/2023) for 15 min visit, after Cleda Daub and DLCO, with Dr Marchelle Gearing, Face to Face Visit.  (Level 04 E&M 2024: Estb >= 30 min visit type: on-site physical face to visit visit spent in total care time and counseling or/and coordination of care by this undersigned MD - Dr Kalman Shan. This includes one or more of the following on this same day 01/18/2023: pre-charting, chart review, note writing, documentation discussion of test results, diagnostic or treatment recommendations, prognosis, risks and benefits of management options, instructions, education, compliance or risk-factor reduction. It excludes time spent by the CMA or office staff in the care of the patient . Actual time is 30 min)   SIGNATURE    Dr. Kalman Shan, M.D., F.C.C.P,  Pulmonary and Critical Care Medicine Staff Physician, [facility] Center Director - Interstitial Lung Disease  Program  Pulmonary Fibrosis [facility] at [facility] [place], [place], [address]  Pager: [phone], If no answer or between  15:00h  - 7:00h: call [phone] Telephone: [phone]  10:17 AM 01/18/2023   Moderate Complexity MDM OFFICE  2021 E/M guidelines, first released in 2021, with minor revisions added in 2023 and 2024 Must meet the requirements for 2 out of 3 dimensions to qualify.    Number and complexity of problems addressed Amount and/or complexity of data reviewed Risk of complications and/or morbidity  One or more chronic illness with mild exacerbation, OR progression, OR  side effects of treatment  Two or more stable chronic illnesses  One undiagnosed new problem with uncertain prognosis  One acute illness with systemic symptoms   One Acute complicated injury Must meet the requirements for 1 of 3 of the categories)  Category 1: Tests and documents, historian  Any combination of 3 of the following:  Assessment requiring an independent historian  Review of prior external note(s) from each unique source  Review of results of each unique test  Ordering of each unique test    Category 2: Interpretation of tests   Independent interpretation of a test performed by another physician/other qualified health care professional (not separately reported)  Category 3: Discuss management/tests  Discussion of management or test interpretation with external physician/other qualified health care professional/appropriate source (not separately reported) Moderate risk of morbidity from additional diagnostic testing or treatment Examples only:  Prescription drug management  Decision regarding minor surgery with identfied patient or procedure risk factors  Decision regarding elective major surgery without identified patient or procedure risk factors  Diagnosis or treatment significantly limited by social determinants of health             HIGh Complexity  OFFICE   2021 E/M guidelines, first released in 2021, with minor revisions added in 2023. Must meet the requirements for 2 out of 3  dimensions to qualify.    Number and  complexity of problems addressed Amount and/or complexity of data reviewed Risk of complications and/or morbidity  Severe exacerbation of chronic illness  Acute or chronic illnesses that may pose a threat to life or bodily function, e.g., multiple trauma, acute MI, pulmonary embolus, severe respiratory distress, progressive rheumatoid arthritis, psychiatric illness with potential threat to self or others, peritonitis, acute renal failure, abrupt change in neurological status Must meet the requirements for 2 of 3 of the categories)  Category 1: Tests and documents, historian  Any combination of 3 of the following:  Assessment requiring an independent historian  Review of prior external note(s) from each unique source  Review of results of each unique test  Ordering of each unique test    Category 2: Interpretation of tests    Independent interpretation of a test performed by another physician/other qualified health care professional (not separately reported)  Category 3: Discuss management/tests  Discussion of management or test interpretation with external physician/other qualified health care professional/appropriate source (not separately reported)  HIGH risk of morbidity from additional diagnostic testing or treatment Examples only:  Drug therapy requiring intensive monitoring for toxicity  Decision for elective major surgery with identified pateint or procedure risk factors  Decision regarding hospitalization or escalation of level of care  Decision for DNR or to de-escalate care   Parenteral controlled  substances            LEGEND - Independent interpretation involves the interpretation of a test for which there is a CPT code, and an interpretation or report is customary. When a review and interpretation of a test is performed and documented by the provider, but not separately reported (billed), then this would represent  an independent interpretation. This report does not need to conform to the usual standards of a complete report of the test. This does not include interpretation of tests that do not have formal reports such as a complete blood count with differential and blood cultures. Examples would include reviewing a chest radiograph and documenting in the medical record an interpretation, but not separately reporting (billing) the interpretation of the chest radiograph.   An appropriate source includes professionals who are not health care professionals but may be involved in the management of the patient, such as a Clinical research associate, upper officer, case manager or teacher, and does not include discussion with family or informal caregivers.    - SDOH: SDOH are the conditions in the environments where people are born, live, learn, work, play, worship, and age that affect a wide range of health, functioning, and quality-of-life outcomes and risks. (e.g., housing, food insecurity, transportation, etc.). SDOH-related Z codes ranging from Z55-Z65 are the ICD-10-CM diagnosis codes used to document SDOH data Z55 - Problems related to education and literacy Z56 - Problems related to employment and unemployment Z57 - Occupational exposure to risk factors Z58 - Problems related to physical environment Z59 - Problems related to housing and economic circumstances [phone] - Problems related to social environment [phone] - Problems related to upbringing [phone] - Other problems related to primary support group, including family circumstances Z80 - Problems related to certain psychosocial circumstances Z65 - Problems related to other psychosocial circumstances

## 2023-01-24 ENCOUNTER — Other Ambulatory Visit (HOSPITAL_COMMUNITY): Payer: Self-pay

## 2023-01-24 MED ORDER — MOUNJARO 10 MG/0.5ML ~~LOC~~ SOAJ
10.0000 mg | SUBCUTANEOUS | 0 refills | Status: DC
  Filled 2023-01-24: qty 2, 28d supply, fill #0

## 2023-01-24 NOTE — Addendum Note (Signed)
Addended by: Malena Peer D on: 01/24/2023 01:22 PM   Modules accepted: Orders

## 2023-01-24 NOTE — Telephone Encounter (Signed)
Per patient, [facility] do not have Mounjaro 10mg . Will send to [facility] as it looks like they have some in stock. Pt appreciative of the help.

## 2023-01-26 ENCOUNTER — Other Ambulatory Visit (HOSPITAL_COMMUNITY): Payer: Self-pay

## 2023-02-05 DIAGNOSIS — J9601 Acute respiratory failure with hypoxia: Secondary | ICD-10-CM | POA: Diagnosis not present

## 2023-02-05 DIAGNOSIS — J8410 Pulmonary fibrosis, unspecified: Secondary | ICD-10-CM | POA: Diagnosis not present

## 2023-02-05 DIAGNOSIS — J849 Interstitial pulmonary disease, unspecified: Secondary | ICD-10-CM | POA: Diagnosis not present

## 2023-02-14 ENCOUNTER — Other Ambulatory Visit: Payer: Self-pay | Admitting: Cardiovascular Disease

## 2023-02-14 ENCOUNTER — Other Ambulatory Visit: Payer: Self-pay

## 2023-02-14 DIAGNOSIS — I480 Paroxysmal atrial fibrillation: Secondary | ICD-10-CM

## 2023-02-14 MED ORDER — APIXABAN 5 MG PO TABS
5.0000 mg | ORAL_TABLET | Freq: Two times a day (BID) | ORAL | 1 refills | Status: DC
Start: 2023-02-14 — End: 2023-02-14

## 2023-02-14 MED ORDER — APIXABAN 5 MG PO TABS
5.0000 mg | ORAL_TABLET | Freq: Two times a day (BID) | ORAL | 1 refills | Status: DC
Start: 2023-02-14 — End: 2023-08-16
  Filled 2023-02-23: qty 180, 90d supply, fill #0
  Filled 2023-05-21: qty 180, 90d supply, fill #1

## 2023-02-14 NOTE — Addendum Note (Signed)
Addended by: Satira Sark on: 02/14/2023 09:29 AM   Modules accepted: Orders

## 2023-02-14 NOTE — Addendum Note (Signed)
Addended by: Satira Sark on: 02/14/2023 09:25 AM   Modules accepted: Orders

## 2023-02-14 NOTE — Telephone Encounter (Signed)
Received faxed Eliquis refill request from [facility].  Pt last saw Dr Shari Prows 08/23/22, last labs 08/30/22 Creat 1.40, age 77, weight 99.2kg, based on specified criteria pt is on appropriate dosage of Eliquis 5mg  BID for afib.  Will refill rx.

## 2023-02-15 ENCOUNTER — Ambulatory Visit: Payer: Medicare Other | Admitting: Internal Medicine

## 2023-02-19 ENCOUNTER — Other Ambulatory Visit (HOSPITAL_COMMUNITY): Payer: Self-pay

## 2023-02-19 ENCOUNTER — Telehealth: Payer: Self-pay | Admitting: Internal Medicine

## 2023-02-19 MED ORDER — MOUNJARO 10 MG/0.5ML ~~LOC~~ SOAJ
10.0000 mg | SUBCUTANEOUS | 0 refills | Status: DC
  Filled 2023-02-19: qty 2, 28d supply, fill #0

## 2023-02-19 NOTE — Telephone Encounter (Signed)
Pt is calling and [facility] is going out of business in 2 wks and she would like TRESIBA FLEXTOUCH 100 UNIT/ML FlexTouch Pen  to be sent to  [facility] Phone: [phone]  Fax: [phone]

## 2023-02-21 ENCOUNTER — Other Ambulatory Visit (HOSPITAL_COMMUNITY): Payer: Self-pay

## 2023-02-21 MED ORDER — TRESIBA FLEXTOUCH 100 UNIT/ML ~~LOC~~ SOPN
PEN_INJECTOR | SUBCUTANEOUS | 3 refills | Status: DC
  Filled 2023-02-21: qty 9, 30d supply, fill #0
  Filled 2023-03-26: qty 9, 30d supply, fill #1
  Filled 2023-04-18: qty 9, 30d supply, fill #2
  Filled 2023-05-18: qty 9, 30d supply, fill #3

## 2023-02-21 NOTE — Telephone Encounter (Signed)
Pt called to say she reached out to the pharmacy and was told they never received her refill request. Pt states she is completely out at this point, and needs her refill asap.  [facility] Phone: [phone]  Fax: [phone]

## 2023-02-21 NOTE — Telephone Encounter (Signed)
Rx sent.   Pt is aware and follow up appt was made .

## 2023-02-23 ENCOUNTER — Other Ambulatory Visit (HOSPITAL_COMMUNITY): Payer: Self-pay

## 2023-02-23 ENCOUNTER — Ambulatory Visit: Payer: Medicare Other | Admitting: Cardiology

## 2023-02-23 MED ORDER — DILTIAZEM HCL ER 360 MG PO TB24
360.0000 mg | ORAL_TABLET | Freq: Every day | ORAL | 3 refills | Status: DC
  Filled 2023-02-26: qty 90, 90d supply, fill #0

## 2023-02-23 MED FILL — Empagliflozin Tab 25 MG: ORAL | 30 days supply | Qty: 30 | Fill #0 | Status: AC

## 2023-02-26 ENCOUNTER — Other Ambulatory Visit (HOSPITAL_COMMUNITY): Payer: Self-pay

## 2023-02-26 ENCOUNTER — Other Ambulatory Visit: Payer: Self-pay

## 2023-02-26 MED FILL — Continuous Glucose System Sensor: 30 days supply | Qty: 3 | Fill #0 | Status: CN

## 2023-02-26 MED FILL — Atorvastatin Calcium Tab 40 MG (Base Equivalent): ORAL | 90 days supply | Qty: 90 | Fill #0 | Status: CN

## 2023-02-26 MED FILL — Atorvastatin Calcium Tab 40 MG (Base Equivalent): ORAL | 90 days supply | Qty: 90 | Fill #0 | Status: AC

## 2023-02-26 MED FILL — Duloxetine HCl Enteric Coated Pellets Cap 60 MG (Base Eq): ORAL | 90 days supply | Qty: 90 | Fill #0 | Status: CN

## 2023-02-26 MED FILL — Dofetilide Cap 250 MCG (0.25 MG): ORAL | 90 days supply | Qty: 180 | Fill #0 | Status: AC

## 2023-02-26 MED FILL — Metoprolol Tartrate Tab 50 MG: ORAL | 90 days supply | Qty: 270 | Fill #0 | Status: AC

## 2023-02-26 MED FILL — Metformin HCl Tab 1000 MG: ORAL | 90 days supply | Qty: 180 | Fill #0 | Status: AC

## 2023-02-26 MED FILL — Omeprazole Cap Delayed Release 20 MG: ORAL | 30 days supply | Qty: 60 | Fill #0 | Status: AC

## 2023-02-27 NOTE — Progress Notes (Unsigned)
No chief complaint on file.   HPI: [name] 77 y.o. come in for Chronic disease management   Dm fu  needs tresiba  l;ast A1c 7 10 23   on mounjaro, jardiance also  and dex com 7 Under caer cardi9ology Pulmonology  ILD Hypersensitivity pneumonitis and other sx  and other times pulm edema cardiogenic  chronic  hypoxic resp faiure   ROS: See pertinent positives and negatives per HPI.  Past Medical History:  Diagnosis Date   Anticoagulant long-term use    pradaxa   Anxiety    Arthritis    "fingers, lower back" (04/23/2017    CAD (coronary artery disease) 0981,1914   post PTCA with bare-metal stenting to mid RCA in December 2004      CHF (congestive heart failure) (HCC)    Chronic atrial fibrillation (HCC) 06/2007   Tachybradycardia pacemaker   Chronic kidney disease    10% function - ?R, other kidney is compensating     CVA (cerebral vascular accident) [facility]) [id number]   denies residual on 04/23/2017   Depression    Diplopia 06/19/2008   Qualifier: Diagnosis of  By: Fabian Sharp MD, Neta Mends    Dysrhythmia    ATRIAL FIBRILATION   Edema of lower extremity    Hyperlipidemia    Hypertension    Inferior myocardial infarction (HCC)    acute inferior wall mi/other medical hx   Myocardial infarction (HCC) 3086,5784   Obesity    OSA on CPAP    last test- 2010   Pacemaker    Pneumonia 2014   tx. ----  [facility]   Pulmonary hypertension (HCC)    moderate pulmonary hypertension by 10/2016 echo and 10/2013 cardiac cath   Shortness of breath    Skin cancer    "cut off right [place]; burned off LLE" (04/23/2017)   Sleep apnea    Spondylolisthesis    TIA (transient ischemic attack) 2008   Unspecified hemorrhoids without mention of complication 07/15/2003   Colonoscopy--Dr. Leone Payor     Family History  Problem Relation Age of Onset   Suicidality Father        suicide death pt was 3 yrs, 48   Arrhythmia Mother    Hypertension Mother    Diabetes Mother    Dementia Mother     Heart attack Brother    Heart disease Paternal Aunt    Prostate cancer Maternal Grandfather    Diabetes Paternal Grandfather        fathers side of the family   Colon cancer Maternal Aunt     Social History   Socioeconomic History   Marital status: Married    Spouse name: Financial risk analyst   Number of children: 0   Years of education: HS   Highest education level: High school graduate  Occupational History   Occupation: retired    Comment: previously worked [employer]  Tobacco Use   Smoking status: Former    Current packs/day: 0.00    Average packs/day: 1 pack/day for 5.0 years (5.0 ttl pk-yrs)    Types: Cigarettes    Start date: 05/11/1978    Quit date: 07/03/1982    Years since quitting: 40.6   Smokeless tobacco: Never  Vaping Use   Vaping status: Never Used  Substance and Sexual Activity   Alcohol use: Yes   Drug use: No   Sexual activity: Not Currently  Other Topics Concern   Not on file  Social History Narrative   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   Married    Originally from [place] of two, high school education   Former smoker 1979-1984 1ppd   Hunting dogs 7    Retired from [employer]   [id number]   [employer] 2    Social Determinants of Corporate investment banker Strain: Low Risk  (01/17/2023)   Overall Financial Resource Strain (CARDIA)    Difficulty of Paying Living Expenses: Not hard at all  Food Insecurity: No Food Insecurity (01/17/2023)   Hunger Vital Sign    Worried About Running Out of Food in the Last Year: Never true    Ran Out of Food in the Last Year: Never true  Transportation Needs: No Transportation Needs (01/17/2023)   PRAPARE - Administrator, Civil Service (Medical): No    Lack of Transportation (Non-Medical): No  Physical Activity: Insufficiently Active (01/17/2023)   Exercise Vital Sign    Days of Exercise per Week: 3 days    Minutes of Exercise per Session: 20 min  Stress: No Stress  Concern Present (01/17/2023)   [employer] of Occupational Health - Occupational Stress Questionnaire    Feeling of Stress : Not at all  Social Connections: Socially Integrated (01/17/2023)   Social Connection and Isolation Panel [NHANES]    Frequency of Communication with Friends and Family: More than three times a week    Frequency of Social Gatherings with Friends and Family: More than three times a week    Attends Religious Services: More than 4 times per year    Active Member of [employer] or Organizations: Yes    Attends Engineer, structural: More than 4 times per year    Marital Status: Married    Outpatient Medications Prior to Visit  Medication Sig Dispense Refill   apixaban (ELIQUIS) 5 MG TABS tablet Take 1 tablet (5 mg total) by mouth 2 (two) times daily. 180 tablet 1   atorvastatin (LIPITOR) 40 MG tablet Take 1 tablet (40 mg total) by mouth daily. 90 tablet 3   BD PEN NEEDLE NANO 2ND GEN 32G X 4 MM MISC daily.     Continuous Glucose Receiver (DEXCOM G7 RECEIVER) DEVI Use as directed while on insulin 1 each 3   Continuous Glucose Sensor (DEXCOM G7 SENSOR) MISC Apply new sensor every 10 days to monitor glucose. Remove old sensor before applying new one. 3 each 3   diltiazem (CARDIZEM LA) 360 MG 24 hr tablet Take 1 tablet (360 mg total) by mouth daily. 90 tablet 3   diltiazem (CARDIZEM LA) 360 MG 24 hr tablet TAKE ONE TABLET BY MOUTH ONCE DAILY 90 tablet 3   dofetilide (TIKOSYN) 250 MCG capsule Take 1 capsule (250 mcg total) by mouth 2 (two) times daily with breakfast and at bedtime. 180 capsule 0   DULoxetine (CYMBALTA) 60 MG capsule Take 1 capsule (60 mg total) by mouth daily. 90 capsule 3   empagliflozin (JARDIANCE) 25 MG TABS tablet TAKE ONE TABLET BY MOUTH BEFORE BREAKFAST 90 tablet 0   ezetimibe (ZETIA) 10 MG tablet Take 1 tablet (10 mg total) by mouth daily. 90 tablet 2   fluticasone (FLONASE) 50 MCG/ACT nasal spray Place 1 spray into both nostrils daily as needed  for allergies.     insulin degludec (TRESIBA FLEXTOUCH) 100 UNIT/ML FlexTouch Pen Inject 28 units into the skin daily as directed 9 mL 3   ipratropium (ATROVENT) 0.06 % nasal spray 1-2 puffs each nostril up to  4 times daily 15 mL 12   loratadine (CLARITIN) 10 MG tablet TAKE 1 TABLET BY MOUTH EVERY DAY 90 tablet 0   metFORMIN (GLUCOPHAGE) 1000 MG tablet Take 1 tablet (1,000 mg total) by mouth 2 (two) times daily. 180 tablet 3   metolazone (ZAROXOLYN) 2.5 MG tablet TAKE 1 TABLET BY MOUTH EVERY 4  DAYS AS NEEDED FOR SWELLING  PLEASE KEEP UPCOMING APPOINTMENT FURTHER REFILLS 25 tablet 2   metoprolol tartrate (LOPRESSOR) 50 MG tablet TAKE ONE TABLET BY MOUTH EVERY MORNING and TAKE TWO TABLETS BY MOUTH EVERY EVENING 270 tablet 3   nitroGLYCERIN (NITROSTAT) 0.4 MG SL tablet Place 1 tablet (0.4 mg total) under the tongue every 5 (five) minutes x 3 doses as needed for chest pain. 15 tablet 6   NON FORMULARY CPAP at bedtime     nystatin-triamcinolone (MYCOLOG II) cream Apply 1 application topically 2 (two) times daily as needed. 30 g 1   omeprazole (PRILOSEC) 20 MG capsule TAKE ONE CAPSULE BY MOUTH BEFORE BREAKFAST and TAKE ONE CAPSULE EVERYDAY AT BEDTIME 60 capsule 3   Polyethyl Glycol-Propyl Glycol (SYSTANE) 0.4-0.3 % SOLN Place 1 drop into both eyes daily as needed (dry eyes).     potassium chloride SA (KLOR-CON M) 20 MEQ tablet Take 1 tablet (20 mEq total) by mouth daily. 90 tablet 3   predniSONE (DELTASONE) 20 MG tablet Take 1 tablet (20 mg total) by mouth 2 (two) times daily with a meal. May raise your blood sugar temporarily 10 tablet 0   spironolactone (ALDACTONE) 25 MG tablet Take 0.5 tablets (12.5 mg total) by mouth daily. 45 tablet 3   tirzepatide (MOUNJARO) 10 MG/0.5ML Pen Inject 10 mg into the skin once a week. 2 mL 0   torsemide (DEMADEX) 20 MG tablet Take 3 tablets (60 mg total) by mouth daily before breakfast 270 tablet 3   valACYclovir (VALTREX) 1000 MG tablet TAKE 2 TABLETS BY MOUTH EVERY 12  HOURS AS NEEDED 34 tablet 2   Facility-Administered Medications Prior to Visit  Medication Dose Route Frequency Provider Last Rate Last Admin   sodium chloride flush (NS) 0.9 % injection 3 mL  3 mL Intravenous Q12H Gaston Islam., NP         EXAM:  There were no vitals taken for this visit.  There is no height or weight on file to calculate BMI.  GENERAL: vitals reviewed and listed above, alert, oriented, appears well hydrated and in no acute distress HEENT: atraumatic, conjunctiva  clear, no obvious abnormalities on inspection of external nose and ears  NECK: no obvious masses on inspection palpation  LUNGS: clear to auscultation bilaterally, no wheezes, rales or rhonchi, good air movement CV: HRRR, no clubbing cyanosis or  peripheral edema nl cap refill  MS: moves all extremities without noticeable focal  abnormality PSYCH: pleasant and cooperative, no obvious depression or anxiety Lab Results  Component Value Date   WBC 11.4 (H) 05/09/2022   HGB 14.8 05/09/2022   HCT 45.4 05/09/2022   PLT 247.0 05/09/2022   GLUCOSE 150 (H) 08/30/2022   CHOL 104 10/23/2022   TRIG 173 (H) 10/23/2022   HDL 33 (L) 10/23/2022   LDLDIRECT 76.0 08/23/2021   LDLCALC 42 10/23/2022   ALT 46 (H) 12/26/2021   AST 36 12/26/2021   NA 141 08/30/2022   K 3.9 08/30/2022   CL 91 (L) 08/30/2022   CREATININE 1.40 (H) 08/30/2022   BUN 32 (H) 08/30/2022   CO2 29 08/30/2022   TSH  4.61 08/23/2021   INR 1.3 (H) 02/17/2020   HGBA1C 7.0 (A) 04/18/2022   MICROALBUR <0.7 01/23/2017   BP Readings from Last 3 Encounters:  01/18/23 120/60  08/23/22 120/72  08/10/22 120/78    ASSESSMENT AND PLAN:  Discussed the following assessment and plan:  Type 2 diabetes mellitus with hyperglycemia, without long-term current use of insulin (HCC) Urine microalbumin  -Patient advised to return or notify health care team  if  new concerns arise.  There are no Patient Instructions on file for this visit.   Neta Mends. Perla Echavarria M.D.

## 2023-02-28 ENCOUNTER — Encounter: Payer: Self-pay | Admitting: Internal Medicine

## 2023-02-28 ENCOUNTER — Ambulatory Visit (INDEPENDENT_AMBULATORY_CARE_PROVIDER_SITE_OTHER): Payer: Medicare Other | Admitting: Internal Medicine

## 2023-02-28 ENCOUNTER — Other Ambulatory Visit (HOSPITAL_COMMUNITY): Payer: Self-pay

## 2023-02-28 ENCOUNTER — Other Ambulatory Visit: Payer: Self-pay

## 2023-02-28 VITALS — BP 102/60 | HR 58 | Temp 98.0°F | Ht 66.5 in | Wt 212.0 lb

## 2023-02-28 DIAGNOSIS — E1165 Type 2 diabetes mellitus with hyperglycemia: Secondary | ICD-10-CM

## 2023-02-28 DIAGNOSIS — Z7985 Long-term (current) use of injectable non-insulin antidiabetic drugs: Secondary | ICD-10-CM

## 2023-02-28 DIAGNOSIS — I5032 Chronic diastolic (congestive) heart failure: Secondary | ICD-10-CM | POA: Diagnosis not present

## 2023-02-28 DIAGNOSIS — Z79899 Other long term (current) drug therapy: Secondary | ICD-10-CM | POA: Diagnosis not present

## 2023-02-28 LAB — POCT GLYCOSYLATED HEMOGLOBIN (HGB A1C): Hemoglobin A1C: 6.1 % — AB (ref 4.0–5.6)

## 2023-02-28 MED ORDER — EMPAGLIFLOZIN 25 MG PO TABS
25.0000 mg | ORAL_TABLET | Freq: Every day | ORAL | 0 refills | Status: DC
Start: 2023-02-28 — End: 2023-06-21
  Filled 2023-02-28: qty 90, fill #0
  Filled 2023-03-27: qty 90, 90d supply, fill #0

## 2023-02-28 MED ORDER — VALACYCLOVIR HCL 1 G PO TABS
ORAL_TABLET | ORAL | 2 refills | Status: AC
  Filled 2023-02-28: qty 34, 9d supply, fill #0

## 2023-02-28 MED ORDER — DULOXETINE HCL 60 MG PO CPEP
60.0000 mg | ORAL_CAPSULE | Freq: Every day | ORAL | 3 refills | Status: DC
  Filled 2023-02-28: qty 90, 90d supply, fill #0
  Filled 2023-05-24: qty 90, 90d supply, fill #1
  Filled 2023-08-22: qty 90, 90d supply, fill #2
  Filled 2023-11-20: qty 90, 90d supply, fill #3

## 2023-02-28 MED ORDER — ONDANSETRON 4 MG PO TBDP
4.0000 mg | ORAL_TABLET | Freq: Three times a day (TID) | ORAL | 1 refills | Status: AC | PRN
  Filled 2023-02-28: qty 20, 7d supply, fill #0
  Filled 2023-06-21: qty 20, 7d supply, fill #1

## 2023-02-28 MED FILL — Continuous Glucose System Sensor: 30 days supply | Qty: 3 | Fill #0 | Status: AC

## 2023-02-28 NOTE — Patient Instructions (Addendum)
Good to see you today . Refill meds. Today .  Ok to use as needed  zofran.  If having to use a lot or vomiting get medically assessed.

## 2023-03-01 ENCOUNTER — Other Ambulatory Visit (HOSPITAL_COMMUNITY): Payer: Self-pay

## 2023-03-02 ENCOUNTER — Other Ambulatory Visit: Payer: Self-pay

## 2023-03-02 ENCOUNTER — Other Ambulatory Visit (HOSPITAL_COMMUNITY): Payer: Self-pay

## 2023-03-02 MED ORDER — SPIRONOLACTONE 25 MG PO TABS
12.5000 mg | ORAL_TABLET | Freq: Every day | ORAL | 1 refills | Status: DC
  Filled 2023-03-02: qty 45, 90d supply, fill #0
  Filled 2023-05-28: qty 45, 90d supply, fill #1

## 2023-03-02 MED ORDER — DILTIAZEM HCL ER 360 MG PO TB24
360.0000 mg | ORAL_TABLET | Freq: Every day | ORAL | 1 refills | Status: DC
  Filled 2023-03-02: qty 90, 90d supply, fill #0
  Filled 2023-06-04: qty 90, 90d supply, fill #1

## 2023-03-08 DIAGNOSIS — J849 Interstitial pulmonary disease, unspecified: Secondary | ICD-10-CM | POA: Diagnosis not present

## 2023-03-08 DIAGNOSIS — J8410 Pulmonary fibrosis, unspecified: Secondary | ICD-10-CM | POA: Diagnosis not present

## 2023-03-08 DIAGNOSIS — J9601 Acute respiratory failure with hypoxia: Secondary | ICD-10-CM | POA: Diagnosis not present

## 2023-03-21 ENCOUNTER — Ambulatory Visit (INDEPENDENT_AMBULATORY_CARE_PROVIDER_SITE_OTHER): Payer: Medicare Other

## 2023-03-21 ENCOUNTER — Telehealth (INDEPENDENT_AMBULATORY_CARE_PROVIDER_SITE_OTHER): Payer: Medicare Other | Admitting: Family Medicine

## 2023-03-21 ENCOUNTER — Encounter: Payer: Self-pay | Admitting: Family Medicine

## 2023-03-21 ENCOUNTER — Other Ambulatory Visit: Payer: Self-pay | Admitting: Cardiology

## 2023-03-21 ENCOUNTER — Telehealth: Payer: Self-pay | Admitting: Pharmacist

## 2023-03-21 ENCOUNTER — Other Ambulatory Visit (HOSPITAL_COMMUNITY): Payer: Self-pay

## 2023-03-21 VITALS — Ht 66.5 in

## 2023-03-21 DIAGNOSIS — U071 COVID-19: Secondary | ICD-10-CM | POA: Diagnosis not present

## 2023-03-21 DIAGNOSIS — R051 Acute cough: Secondary | ICD-10-CM

## 2023-03-21 DIAGNOSIS — R6889 Other general symptoms and signs: Secondary | ICD-10-CM

## 2023-03-21 LAB — POCT INFLUENZA A/B
Influenza A, POC: NEGATIVE
Influenza B, POC: NEGATIVE

## 2023-03-21 LAB — POCT RAPID STREP A (OFFICE): Rapid Strep A Screen: NEGATIVE

## 2023-03-21 LAB — POC COVID19 BINAXNOW: SARS Coronavirus 2 Ag: POSITIVE — AB

## 2023-03-21 MED ORDER — MOUNJARO 12.5 MG/0.5ML ~~LOC~~ SOAJ
12.5000 mg | SUBCUTANEOUS | 0 refills | Status: DC
  Filled 2023-03-21: qty 2, 28d supply, fill #0

## 2023-03-21 MED ORDER — MOLNUPIRAVIR EUA 200MG CAPSULE
4.0000 | ORAL_CAPSULE | Freq: Two times a day (BID) | ORAL | 0 refills | Status: AC
Start: 2023-03-21 — End: 2023-03-26
  Filled 2023-03-21: qty 40, 5d supply, fill #0

## 2023-03-21 MED ORDER — BENZONATATE 100 MG PO CAPS
200.0000 mg | ORAL_CAPSULE | Freq: Two times a day (BID) | ORAL | 0 refills | Status: AC | PRN
Start: 2023-03-21 — End: 2023-03-31
  Filled 2023-03-21: qty 30, 8d supply, fill #0

## 2023-03-21 NOTE — Telephone Encounter (Signed)
Received refill request for Mounjaro 10mg . Called pt to discuss med tolerability and potential dose increase. Tolerating med well, no side effects. Wishes to increase dose. Rx sent in for 12.5mg  for next month.

## 2023-03-21 NOTE — Telephone Encounter (Signed)
Called pt to discuss med tolerability and potential dose increase. Tolerating med well, no side effects. Wishes to increase dose. Rx sent in for 12.5mg  for next month.

## 2023-03-21 NOTE — Progress Notes (Signed)
Virtual Visit via Video Note I connected with [name] on 03/19/23 by a video enabled telemedicine application and verified that I am speaking with the correct person using two identifiers. Location patient: home Location provider:work office Persons participating in the virtual visit: patient, provider  I discussed the limitations of evaluation and management by telemedicine and the availability of in person appointments. The patient expressed understanding and agreed to proceed.  Chief Complaint  Patient presents with   Covid Positive    Started 4 days ago, headache, coughing, runny nose and congestion,    HPI:  Ms.[name] is a 77 y.o. female with a PMHx significant for CAD, atrial fibrillation on chronic anticoagulation , HTN, HLD, Pre DM, and Allergic rhinitis, and CKD who is complaining of congestion and coughing.   Patient states she developed a headache Saturday night and went to sleep before waking up Sunday with her right-sided sore throat, and ear "stuffed up," as well as some sneezing and coughing.  She states her cough is non-productive. She also states she has been experiencing nasal congestion,post nasal drainage, and rhinorrhea.  Frontal headache, fatigue,and body aches.  While she is on supplemental oxygen, she reports chronic DOE, unchanged from her baseline. She states that she has been taking Nyquil to help her sleep.   She denies fever,chills, earache,CP, palpitation, abd pain, nausea, vomiting, urinary symptoms, or skin rash.   Lab Results  Component Value Date   NA 141 08/30/2022   CL 91 (L) 08/30/2022   K 3.9 08/30/2022   CO2 29 08/30/2022   BUN 32 (H) 08/30/2022   CREATININE 1.40 (H) 08/30/2022   EGFR 39 (L) 08/30/2022   CALCIUM 10.7 (H) 08/30/2022   PHOS 4.0 06/01/2018   ALBUMIN 3.9 12/26/2021   GLUCOSE 150 (H) 08/30/2022   Atrial fibrillation on Eliquis 5 mg twice daily. Patient states her old cardiologist has moved so she will establish with  a new cardiologist in 10/24.  ROS: See pertinent positives and negatives per HPI.  Past Medical History:  Diagnosis Date   Anticoagulant long-term use    pradaxa   Anxiety    Arthritis    "fingers, lower back" (04/23/2017    CAD (coronary artery disease) 1610,9604   post PTCA with bare-metal stenting to mid RCA in December 2004      CHF (congestive heart failure) (HCC)    Chronic atrial fibrillation (HCC) 06/2007   Tachybradycardia pacemaker   Chronic kidney disease    10% function - ?R, other kidney is compensating     CVA (cerebral vascular accident) [facility]) [phone]   denies residual on 04/23/2017   Depression    Diplopia 06/19/2008   Qualifier: Diagnosis of  By: Fabian Sharp MD, Neta Mends    Dysrhythmia    ATRIAL FIBRILATION   Edema of lower extremity    Hyperlipidemia    Hypertension    Inferior myocardial infarction [facility])    acute inferior wall mi/other medical hx   Myocardial infarction [facility]) [phone]   Obesity    OSA on CPAP    last test- 2010   Pacemaker    Pneumonia 2014   tx. ----  [facility]   Pulmonary hypertension (HCC)    moderate pulmonary hypertension by 10/2016 echo and 10/2013 cardiac cath   Shortness of breath    Skin cancer    "cut off right Fecteau; burned off LLE" (04/23/2017)   Sleep apnea    Spondylolisthesis    TIA (transient ischemic attack) 2008  Unspecified hemorrhoids without mention of complication 07/15/2003   Colonoscopy--Dr. Leone Payor     Past Surgical History:  Procedure Laterality Date   ABDOMINAL HYSTERECTOMY     APPENDECTOMY  1984   ATRIAL FIBRILLATION ABLATION N/A 09/29/2016   Procedure: Atrial Fibrillation Ablation;  Surgeon: Will Jorja Loa, MD;  Location: [facility] INVASIVE CV LAB;  Service: Cardiovascular;  Laterality: N/A;   ATRIAL FIBRILLATION ABLATION N/A 02/07/2018   Procedure: ATRIAL FIBRILLATION ABLATION;  Surgeon: Regan Lemming, MD;  Location: [facility] INVAS[facility] CV LAB;  Service: Cardiovascular;  Laterality: N/A;   ATRIAL  FIBRILLATION ABLATION N/A 03/13/2019   Procedure: ATRIAL FIBRILLATION ABLATION;  Surgeon: Regan Lemming, MD;  Location: [facility] CV LAB;  Service: Cardiovascular;  Laterality: N/A;   ATRIAL FIBRILLATION ABLATION N/A 07/07/2020   Procedure: ATRIAL FIBRILLATION ABLATION;  Surgeon: Regan Lemming, MD;  [facility]: [facility];  Service: Cardiovascular;  Laterality: N/A;   BACK SURGERY     CARDIOVERSION N/A 09/12/2017   Procedure: CARDIOVERSION;  Surgeon: Jake Bathe, MD;  Location: [facility] ENDOSCOPY;  Service: Cardiovascular;  Laterality: N/A;   CARDIOVERSION N/A 12/13/2017   Procedure: CARDIOVERSION;  Surgeon: Thurmon Fair, MD;  Location: [facility] ENDOSCOPY;  Service: Cardiovascular;  Laterality: N/A;   CARDIOVERSION N/A 02/18/2018   Procedure: CARDIOVERSION;  Surgeon: Lars Masson, MD;  Location: [facility] ENDOSCOPY;  Service: Cardiovascular;  Laterality: N/A;   CARDIOVERSION N/A 05/20/2018   Procedure: CARDIOVERSION;  Surgeon: Quintella Reichert, MD;  Location: [facility] ENDOSCOPY;  Service: Cardiovascular;  Laterality: N/A;   CARDIOVERSION N/A 04/16/2019   Procedure: CARDIOVERSION;  Surgeon: Little Ishikawa, MD;  Location: [facility] ENDOSCOPY;  Service: Endoscopy;  Laterality: N/A;   CARDIOVERSION N/A 01/22/2020   Procedure: CARDIOVERSION;  Surgeon: Sande Rives, MD;  Location: [facility] ENDOSCOPY;  Service: Cardiovascular;  Laterality: N/A;   CARDIOVERSION N/A 02/18/2020   Procedure: CARDIOVERSION;  Surgeon: Parke Poisson, MD;  Location: [facility] ENDOSCOPY;  Service: Cardiovascular;  Laterality: N/A;   CATARACT EXTRACTION W/ INTRAOCULAR LENS  IMPLANT, BILATERAL Bilateral 01/15/2017- 03/2017   CHOLECYSTECTOMY     CORONARY ANGIOPLASTY  X 2   CORONARY ANGIOPLASTY WITH STENT PLACEMENT  1998; ~ 2007; ?date   "1 stent; replaced stent; not sure when I got the last stent" (04/23/2017)   DOPPLER ECHOCARDIOGRAPHY  2009   ELECTROPHYSIOLOGIC STUDY N/A 03/31/2016   Procedure: Cardioversion;  Surgeon:  Marinus Maw, MD;  [facility];  Service: Cardiovascular;  Laterality: N/A;   ELECTROPHYSIOLOGIC STUDY N/A 08/04/2016   Procedure: Cardioversion;  Surgeon: Marinus Maw, MD;  Location: [facility] INVASIVE CV LAB;  Service: Cardiovascular;  Laterality: N/A;   INSERT / REPLACE / REMOVE PACEMAKER  06/2007   IR RADIOLOGY PERIPHERAL GUIDED IV START  01/31/2018   IR US GUIDE VASC ACCESS LEFT  01/31/2018   JOINT REPLACEMENT     LAPAROSCOPIC CHOLECYSTECTOMY  1994   LEFT AND RIGHT HEART CATHETERIZATION WITH CORONARY ANGIOGRAM N/A 10/06/2013   Procedure: LEFT AND RIGHT HEART CATHETERIZATION WITH CORONARY ANGIOGRAM;  Surgeon: Lennette Bihari, MD;  Location: [facility] CATH LAB;  Service: Cardiovascular;  Laterality: N/A;   LEFT OOPHORECTOMY Left ~ 1989   POSTERIOR LUMBAR FUSION  2000s - 04/2015 X 3   L3-4; L4-5; L2-3; Dr Dutch Quint   RIGHT HEART CATH N/A 01/04/2022   Procedure: RIGHT HEART CATH;  Surgeon: Laurey Morale, MD;  Location: [facility] INVASIVE CV LAB;  Service: Cardiovascular;  Laterality: N/A;   RIGHT HEART CATH N/A 05/02/2022  Procedure: RIGHT HEART CATH;  Surgeon: Laurey Morale, MD;  Location: [facility] INVASIVE CV LAB;  Service: Cardiovascular;  Laterality: N/A;   RIGHT/LEFT HEART CATH AND CORONARY ANGIOGRAPHY N/A 05/09/2017   Procedure: RIGHT/LEFT HEART CATH AND CORONARY ANGIOGRAPHY;  Surgeon: Tonny Bollman, MD;  Location: [facility] INVASIVE CV LAB;  Service: Cardiovascular;  Laterality: N/A;   SKIN CANCER EXCISION Right    Gianfrancesco   TEE WITHOUT CARDIOVERSION N/A 09/29/2016   Procedure: TRANSESOPHAGEAL ECHOCARDIOGRAM (TEE);  Surgeon: Jake Bathe, MD;  Location: [facility] ENDOSCOPY;  Service: Cardiovascular;  Laterality: N/A;   TOTAL ABDOMINAL HYSTERECTOMY  1984   "uterus & right ovary"   TOTAL KNEE ARTHROPLASTY Left 04/23/2017   TOTAL KNEE ARTHROPLASTY Left 04/23/2017   Procedure: TOTAL KNEE ARTHROPLASTY;  Surgeon: Gean Birchwood, MD;  Location: [facility] OR;  Service: Orthopedics;  Laterality: Left;   ULTRASOUND GUIDANCE  FOR VASCULAR ACCESS  05/09/2017   Procedure: Ultrasound Guidance For Vascular Access;  Surgeon: Tonny Bollman, MD;  Location: [facility] INVASIVE CV LAB;  Service: Cardiovascular;;    Family History  Problem Relation Age of Onset   Suicidality Father        suicide death pt was 3 yrs, 28   Arrhythmia Mother    Hypertension Mother    Diabetes Mother    Dementia Mother    Heart attack Brother    Heart disease Paternal Aunt    Prostate cancer Maternal Grandfather    Diabetes Paternal Grandfather        fathers side of the family   Colon cancer Maternal Aunt     Social History   Socioeconomic History   Marital status: Married    Spouse name: Financial risk analyst   Number of children: 0   Years of education: HS   Highest education level: High school graduate  Occupational History   Occupation: retired    Comment: previously worked [employer]  Tobacco Use   Smoking status: Former    Current packs/day: 0.00    Average packs/day: 1 pack/day for 5.0 years (5.0 ttl pk-yrs)    Types: Cigarettes    Start date: 05/11/1978    Quit date: 07/03/1982    Years since quitting: 40.7   Smokeless tobacco: Never  Vaping Use   Vaping status: Never Used  Substance and Sexual Activity   Alcohol use: Yes   Drug use: No   Sexual activity: Not Currently  Other Topics Concern   Not on file  Social History Narrative   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   Married    Originally from [place] of two, high school education   Former smoker 1979-1984 1ppd   Hunting dogs 7    Retired from [employer]   [id number]   [employer] 2    Social Determinants of Corporate investment banker Strain: Low Risk  (01/17/2023)   Overall Financial Resource Strain (CARDIA)    Difficulty of Paying Living Expenses: Not hard at all  Food Insecurity: No Food Insecurity (01/17/2023)   Hunger Vital Sign    Worried About Running Out of Food in the Last Year: Never true    Ran Out of Food in the  Last Year: Never true  Transportation Needs: No Transportation Needs (01/17/2023)   PRAPARE - Administrator, Civil Service (Medical): No    Lack of Transportation (Non-Medical): No  Physical Activity: Insufficiently Active (01/17/2023)   Exercise Vital Sign    Days of Exercise  per Week: 3 days    Minutes of Exercise per Session: 20 min  Stress: No Stress Concern Present (01/17/2023)   [employer] of Occupational Health - Occupational Stress Questionnaire    Feeling of Stress : Not at all  Social Connections: Socially Integrated (01/17/2023)   Social Connection and Isolation Panel [NHANES]    Frequency of Communication with Friends and Family: More than three times a week    Frequency of Social Gatherings with Friends and Family: More than three times a week    Attends Religious Services: More than 4 times per year    Active Member of [employer] or Organizations: Yes    Attends Engineer, structural: More than 4 times per year    Marital Status: Married  Catering manager Violence: Not At Risk (01/17/2023)   Humiliation, Afraid, Rape, and Kick questionnaire    Fear of Current or Ex-Partner: No    Emotionally Abused: No    Physically Abused: No    Sexually Abused: No     Current Outpatient Medications:    apixaban (ELIQUIS) 5 MG TABS tablet, Take 1 tablet (5 mg total) by mouth 2 (two) times daily., Disp: 180 tablet, Rfl: 1   atorvastatin (LIPITOR) 40 MG tablet, Take 1 tablet (40 mg total) by mouth daily., Disp: 90 tablet, Rfl: 3   BD PEN NEEDLE NANO 2ND GEN 32G X 4 MM MISC, daily., Disp: , Rfl:    benzonatate (TESSALON) 100 MG capsule, Take 2 capsules (200 mg total) by mouth 2 (two) times daily as needed for up to 10 days., Disp: 30 capsule, Rfl: 0   Continuous Glucose Receiver (DEXCOM G7 RECEIVER) DEVI, Use as directed while on insulin, Disp: 1 each, Rfl: 3   Continuous Glucose Sensor (DEXCOM G7 SENSOR) MISC, Apply new sensor every 10 days to monitor glucose. Remove  old sensor before applying new one., Disp: 3 each, Rfl: 3   diltiazem (CARDIZEM LA) 360 MG 24 hr tablet, Take 1 tablet (360 mg total) by mouth daily., Disp: 90 tablet, Rfl: 1   dofetilide (TIKOSYN) 250 MCG capsule, Take 1 capsule (250 mcg total) by mouth 2 (two) times daily with breakfast and at bedtime., Disp: 180 capsule, Rfl: 0   DULoxetine (CYMBALTA) 60 MG capsule, Take 1 capsule (60 mg total) by mouth daily., Disp: 90 capsule, Rfl: 3   empagliflozin (JARDIANCE) 25 MG TABS tablet, TAKE ONE TABLET BY MOUTH BEFORE BREAKFAST, Disp: 90 tablet, Rfl: 0   ezetimibe (ZETIA) 10 MG tablet, Take 1 tablet (10 mg total) by mouth daily., Disp: 90 tablet, Rfl: 2   fluticasone (FLONASE) 50 MCG/ACT nasal spray, Place 1 spray into both nostrils daily as needed for allergies., Disp: , Rfl:    insulin degludec (TRESIBA FLEXTOUCH) 100 UNIT/ML FlexTouch Pen, Inject 28 units into the skin daily as directed, Disp: 9 mL, Rfl: 3   ipratropium (ATROVENT) 0.06 % nasal spray, 1-2 puffs each nostril up to 4 times daily, Disp: 15 mL, Rfl: 12   loratadine (CLARITIN) 10 MG tablet, TAKE 1 TABLET BY MOUTH EVERY DAY, Disp: 90 tablet, Rfl: 0   metFORMIN (GLUCOPHAGE) 1000 MG tablet, Take 1 tablet (1,000 mg total) by mouth 2 (two) times daily., Disp: 180 tablet, Rfl: 3   metolazone (ZAROXOLYN) 2.5 MG tablet, TAKE 1 TABLET BY MOUTH EVERY 4  DAYS AS NEEDED FOR SWELLING  PLEASE KEEP UPCOMING APPOINTMENT FURTHER REFILLS, Disp: 25 tablet, Rfl: 2   metoprolol tartrate (LOPRESSOR) 50 MG tablet, TAKE ONE TABLET BY MOUTH  EVERY MORNING and TAKE TWO TABLETS BY MOUTH EVERY EVENING, Disp: 270 tablet, Rfl: 3   molnupiravir EUA (LAGEVRIO) 200 mg CAPS capsule, Take 4 capsules (800 mg total) by mouth 2 (two) times daily for 5 days., Disp: 40 capsule, Rfl: 0   nitroGLYCERIN (NITROSTAT) 0.4 MG SL tablet, Place 1 tablet (0.4 mg total) under the tongue every 5 (five) minutes x 3 doses as needed for chest pain., Disp: 15 tablet, Rfl: 6   NON FORMULARY, CPAP  at bedtime, Disp: , Rfl:    nystatin-triamcinolone (MYCOLOG II) cream, Apply 1 application topically 2 (two) times daily as needed., Disp: 30 g, Rfl: 1   omeprazole (PRILOSEC) 20 MG capsule, TAKE ONE CAPSULE BY MOUTH BEFORE BREAKFAST and TAKE ONE CAPSULE EVERYDAY AT BEDTIME, Disp: 60 capsule, Rfl: 3   ondansetron (ZOFRAN-ODT) 4 MG disintegrating tablet, Take 1 tablet (4 mg total) by mouth every 8 (eight) hours as needed for nausea or vomiting., Disp: 20 tablet, Rfl: 1   Polyethyl Glycol-Propyl Glycol (SYSTANE) 0.4-0.3 % SOLN, Place 1 drop into both eyes daily as needed (dry eyes)., Disp: , Rfl:    potassium chloride SA (KLOR-CON M) 20 MEQ tablet, Take 1 tablet (20 mEq total) by mouth daily., Disp: 90 tablet, Rfl: 3   spironolactone (ALDACTONE) 25 MG tablet, Take 0.5 tablets (12.5 mg total) by mouth daily., Disp: 45 tablet, Rfl: 1   torsemide (DEMADEX) 20 MG tablet, Take 3 tablets (60 mg total) by mouth daily before breakfast, Disp: 270 tablet, Rfl: 3   valACYclovir (VALTREX) 1000 MG tablet, TAKE 2 TABLETS BY MOUTH EVERY 12 HOURS AS NEEDED, Disp: 34 tablet, Rfl: 2   tirzepatide (MOUNJARO) 12.5 MG/0.5ML Pen, Inject 12.5 mg into the skin once a week., Disp: 2 mL, Rfl: 0  Current Facility-Administered Medications:    sodium chloride flush (NS) 0.9 % injection 3 mL, 3 mL, Intravenous, Q12H, Louanne Skye, Devoria Albe., NP  Francia GreavesTheodoro Kos per patient if applicable:Ht 5' 6.5" (1.689 m)   BMI 33.71 kg/m   GENERAL: alert, oriented, appears well and in no acute distress  HEENT: atraumatic, conjunctiva clear, no obvious abnormalities on inspection of external nose and ears  NECK: normal movements of the head and neck  LUNGS: on inspection no signs of respiratory distress, breathing rate appears normal, no obvious gross SOB, gasping or wheezing. On supplemental O2 per nasal cannula.  CV: no obvious cyanosis  MS: moves all visible extremities without noticeable abnormality  PSYCH/NEURO: pleasant and  cooperative, no obvious depression or anxiety, speech and thought processing grossly intact  ASSESSMENT AND PLAN:  Discussed the following assessment and plan:  COVID-19 virus infection - Plan: molnupiravir EUA (LAGEVRIO) 200 mg CAPS capsule We discussed Dx,possible complications and treatment options. She has a mild to moderate case with high risk for complications. We discussed oral antiviral options and side effects.  Because the risk of med interaction and her chronic comorbidities, I do not think Paxlovid is a good option. She agrees with trying molnupiravir. Symptomatic treatment with plenty of fluids,rest,tylenol 500 mg 3-4 times per day prn. Throat lozenges if needed for sore throat. Explained that cough and congestion may last a few more days and even weeks after acute symptoms have resolved. Clearly instructed about warning signs.  Acute cough - Plan: benzonatate (TESSALON) 100 MG capsule For cough management recommend benzonatate. Adequate hydration. If cough becomes more productive, she can try plain Mucinex.  We discussed possible serious and likely etiologies, options for evaluation and workup, limitations of  telemedicine visit vs in person visit, treatment, treatment risks and precautions. The patient was advised to call back or seek an in-person evaluation if the symptoms worsen or if the condition fails to improve as anticipated. I discussed the assessment and treatment plan with the patient. The patient was provided an opportunity to ask questions and all were answered. The patient agreed with the plan and demonstrated an understanding of the instructions.  Return if symptoms worsen or fail to improve.  I,[name],acting as a scribe for Wilgus Deyton [place], MD.,have documented all relevant documentation on the behalf of Samantha Mahajan [place], MD,as directed by  Samantha Bulger [place], MD while in the presence of Samantha Brunetto [place], MD.  I, Deja Kaigler [place], MD, have reviewed all documentation for  this visit. The documentation on 03/21/23 for the exam, diagnosis, procedures, and orders are all accurate and complete.  Samantha Sher [place], MD

## 2023-03-21 NOTE — Progress Notes (Signed)
Patient was unable to self-report due to a lack of equipment at home via telehealth

## 2023-03-22 ENCOUNTER — Other Ambulatory Visit (HOSPITAL_COMMUNITY): Payer: Self-pay

## 2023-03-26 ENCOUNTER — Other Ambulatory Visit (HOSPITAL_COMMUNITY): Payer: Self-pay

## 2023-03-26 ENCOUNTER — Other Ambulatory Visit: Payer: Self-pay

## 2023-03-27 ENCOUNTER — Other Ambulatory Visit: Payer: Self-pay

## 2023-03-27 DIAGNOSIS — H43813 Vitreous degeneration, bilateral: Secondary | ICD-10-CM | POA: Diagnosis not present

## 2023-03-27 DIAGNOSIS — Z961 Presence of intraocular lens: Secondary | ICD-10-CM | POA: Diagnosis not present

## 2023-03-27 DIAGNOSIS — D492 Neoplasm of unspecified behavior of bone, soft tissue, and skin: Secondary | ICD-10-CM | POA: Diagnosis not present

## 2023-03-27 DIAGNOSIS — E119 Type 2 diabetes mellitus without complications: Secondary | ICD-10-CM | POA: Diagnosis not present

## 2023-03-27 DIAGNOSIS — H10413 Chronic giant papillary conjunctivitis, bilateral: Secondary | ICD-10-CM | POA: Diagnosis not present

## 2023-03-27 DIAGNOSIS — H04123 Dry eye syndrome of bilateral lacrimal glands: Secondary | ICD-10-CM | POA: Diagnosis not present

## 2023-03-27 DIAGNOSIS — H26491 Other secondary cataract, right eye: Secondary | ICD-10-CM | POA: Diagnosis not present

## 2023-03-27 DIAGNOSIS — H02834 Dermatochalasis of left upper eyelid: Secondary | ICD-10-CM | POA: Diagnosis not present

## 2023-03-27 DIAGNOSIS — H02831 Dermatochalasis of right upper eyelid: Secondary | ICD-10-CM | POA: Diagnosis not present

## 2023-03-27 DIAGNOSIS — H40053 Ocular hypertension, bilateral: Secondary | ICD-10-CM | POA: Diagnosis not present

## 2023-03-27 LAB — HM DIABETES EYE EXAM

## 2023-03-27 MED FILL — Continuous Glucose System Sensor: 30 days supply | Qty: 3 | Fill #1 | Status: AC

## 2023-03-27 MED FILL — Omeprazole Cap Delayed Release 20 MG: ORAL | 30 days supply | Qty: 60 | Fill #1 | Status: AC

## 2023-04-02 ENCOUNTER — Other Ambulatory Visit (HOSPITAL_COMMUNITY): Payer: Self-pay

## 2023-04-16 ENCOUNTER — Other Ambulatory Visit: Payer: Self-pay | Admitting: Internal Medicine

## 2023-04-17 ENCOUNTER — Other Ambulatory Visit (HOSPITAL_COMMUNITY): Payer: Self-pay

## 2023-04-17 ENCOUNTER — Telehealth: Payer: Self-pay | Admitting: Pharmacist

## 2023-04-17 MED ORDER — MOUNJARO 12.5 MG/0.5ML ~~LOC~~ SOAJ
12.5000 mg | SUBCUTANEOUS | 0 refills | Status: DC
  Filled 2023-04-17: qty 2, 28d supply, fill #0

## 2023-04-17 NOTE — Telephone Encounter (Signed)
Received refill request for Mounjaro 12.5mg . Called pt to follow up on tolerability and see if she would like to increase dose. Spoke with patient. She would like to stay at 12.5mg  for another month. Rx sent in in another encounter.

## 2023-04-23 ENCOUNTER — Other Ambulatory Visit (HOSPITAL_COMMUNITY): Payer: Self-pay

## 2023-04-23 MED ORDER — COVID-19 MRNA VAC-TRIS(PFIZER) 30 MCG/0.3ML IM SUSY
0.3000 mL | PREFILLED_SYRINGE | Freq: Once | INTRAMUSCULAR | 0 refills | Status: AC
  Filled 2023-04-23: qty 0.3, 1d supply, fill #0

## 2023-04-23 MED ORDER — INFLUENZA VAC A&B SURF ANT ADJ 0.5 ML IM SUSY
0.5000 mL | PREFILLED_SYRINGE | Freq: Once | INTRAMUSCULAR | 0 refills | Status: AC
  Filled 2023-04-23: qty 0.5, 1d supply, fill #0

## 2023-04-25 ENCOUNTER — Other Ambulatory Visit: Payer: Self-pay | Admitting: Family

## 2023-04-25 ENCOUNTER — Other Ambulatory Visit: Payer: Self-pay

## 2023-04-25 MED FILL — Omeprazole Cap Delayed Release 20 MG: ORAL | 30 days supply | Qty: 60 | Fill #2 | Status: AC

## 2023-04-26 ENCOUNTER — Other Ambulatory Visit (HOSPITAL_COMMUNITY): Payer: Self-pay

## 2023-05-04 ENCOUNTER — Other Ambulatory Visit (HOSPITAL_COMMUNITY): Payer: Self-pay

## 2023-05-07 ENCOUNTER — Telehealth: Payer: Self-pay

## 2023-05-07 ENCOUNTER — Other Ambulatory Visit (HOSPITAL_COMMUNITY): Payer: Self-pay

## 2023-05-07 DIAGNOSIS — E1169 Type 2 diabetes mellitus with other specified complication: Secondary | ICD-10-CM

## 2023-05-07 MED ORDER — DEXCOM G7 SENSOR MISC
11 refills | Status: DC
  Filled 2023-05-07: qty 3, 30d supply, fill #0
  Filled 2023-05-31: qty 3, 30d supply, fill #1
  Filled 2023-06-30: qty 3, 30d supply, fill #2
  Filled 2023-07-30: qty 3, 30d supply, fill #3
  Filled 2023-08-25: qty 3, 30d supply, fill #4
  Filled 2023-09-24: qty 3, 30d supply, fill #5
  Filled 2023-10-23: qty 3, 30d supply, fill #6
  Filled 2023-11-22: qty 3, 30d supply, fill #7
  Filled 2023-12-21: qty 3, 30d supply, fill #8
  Filled 2024-01-21: qty 3, 30d supply, fill #9
  Filled 2024-02-19: qty 3, 30d supply, fill #10
  Filled 2024-03-17: qty 3, 30d supply, fill #11

## 2023-05-07 NOTE — Progress Notes (Signed)
   05/07/2023  Patient ID: [name], female   DOB: [birth year], 77 y.o.   MRN: [id number]  Patient called in stating that she needed a refill on her Dexcom G7 Sensors. Pharmacy has sent the request.  Will renew sensor prescription per standing protocol.  Sherrill Raring, PharmD Clinical Pharmacist [phone]

## 2023-05-08 ENCOUNTER — Other Ambulatory Visit (HOSPITAL_COMMUNITY): Payer: Self-pay

## 2023-05-14 ENCOUNTER — Encounter: Payer: Self-pay | Admitting: Internal Medicine

## 2023-05-14 ENCOUNTER — Ambulatory Visit: Payer: Medicare Other | Attending: Cardiology | Admitting: Internal Medicine

## 2023-05-14 ENCOUNTER — Other Ambulatory Visit (HOSPITAL_COMMUNITY): Payer: Self-pay

## 2023-05-14 VITALS — BP 100/64 | HR 74 | Ht 66.5 in | Wt 204.6 lb

## 2023-05-14 DIAGNOSIS — I495 Sick sinus syndrome: Secondary | ICD-10-CM | POA: Diagnosis not present

## 2023-05-14 DIAGNOSIS — G4733 Obstructive sleep apnea (adult) (pediatric): Secondary | ICD-10-CM | POA: Diagnosis not present

## 2023-05-14 DIAGNOSIS — I2510 Atherosclerotic heart disease of native coronary artery without angina pectoris: Secondary | ICD-10-CM | POA: Diagnosis not present

## 2023-05-14 DIAGNOSIS — I480 Paroxysmal atrial fibrillation: Secondary | ICD-10-CM

## 2023-05-14 DIAGNOSIS — J8410 Pulmonary fibrosis, unspecified: Secondary | ICD-10-CM

## 2023-05-14 DIAGNOSIS — Z9861 Coronary angioplasty status: Secondary | ICD-10-CM

## 2023-05-14 DIAGNOSIS — I272 Pulmonary hypertension, unspecified: Secondary | ICD-10-CM | POA: Diagnosis not present

## 2023-05-14 DIAGNOSIS — J849 Interstitial pulmonary disease, unspecified: Secondary | ICD-10-CM | POA: Diagnosis not present

## 2023-05-14 MED ORDER — NITROGLYCERIN 0.4 MG SL SUBL
0.4000 mg | SUBLINGUAL_TABLET | SUBLINGUAL | 3 refills | Status: AC | PRN
  Filled 2023-05-14: qty 25, 3d supply, fill #0

## 2023-05-14 NOTE — Progress Notes (Signed)
Cardiology Office Note:  .    Date:  05/14/2023  ID:  [name], DOB 07/20/1945, MRN [id number] PCP: Madelin Headings, MD  [facility] Providers Cardiologist:  Meriam Sprague, MD (Inactive) Electrophysiologist:  Will Jorja Loa, MD     CC: Transition to new cardiologist  History of Present Illness: .    [name] is a 77 y.o. female  with mixed PH and complex PAF presents to transition her care from Dr. Shari Prows.  Discussed the use of AI scribe software for clinical note transcription with the patient, who gave verbal consent to proceed.  Miss Wissel, a 77 year old individual with a complex medical history, presents for follow-up. She has a history of coronary artery disease, status post bare metal stent placement in the right coronary artery in 2004. She also has heart failure with preserved ejection fraction and paroxysmal atrial fibrillation, which has been managed with multiple ablations, cardioversion, and Tikosyn. However, this treatment was complicated by tachy-brady syndrome, necessitating a pacemaker. She also has obstructive sleep apnea and morbid obesity, for which she is taking Mounjaro.  In her last evaluation, she was found to have mixed pulmonary hypertension related to interstitial lung disease. She required three liters of nasal cannula during the day and at night with her CPAP. Occasionally, she needed metolazone and was classified as NYHA class two to three.  She has been working on weight loss and has lost about 70 pounds. She reports feeling the best she has in years and is motivated to continue her weight loss journey. She has been managing her hyperlipidemia with atorvastatin and Zetia, and her LDL is at goal. She is also on diltiazem for atrial fibrillation and metoprolol without evidence of significant bradycardia. Her potassium is well controlled on potassium 20 mcg.  She is on 60 mg of torsemide daily and 12.5 mg of spironolactone daily. She  has not needed any of her pure and nitroglycerin. She has been compliant with her CPAP for obstructive sleep apnea. She reports occasional hot flashes. She has been experiencing some unsteadiness but has not had any falls.  No chest pain, SOB, or palpitations.  Relevant histories: .  Social former PN, GT, HP patient; she is from [place]. ROS: As per HPI.   Studies Reviewed: .   Cardiac Studies & Procedures   CARDIAC CATHETERIZATION  CARDIAC CATHETERIZATION 05/02/2022  Narrative 1. Borderline elevated PCWP, normal RA pressure. 2. Moderate primarily pulmonary venous hypertension with PVR 2.97 WU.  Continue current torsemide and Jardiance.   CARDIAC CATHETERIZATION  CARDIAC CATHETERIZATION 01/04/2022  Narrative 1. Elevated right and left heart filling pressures. 2. Moderate primarily pulmonary venous hypertension. 3. Preserved cardiac output.  Patient will increase torsemide to 60 mg daily.     ECHOCARDIOGRAM  ECHOCARDIOGRAM COMPLETE 12/10/2021  Narrative ECHOCARDIOGRAM REPORT    Patient Name:   [name] Date of Exam: 12/10/2021 Medical Rec #:  [id number]    Height:       66.0 in Accession #:    [id number]   Weight:       244.0 lb Date of Birth:  Nov 27, 1945     BSA:          2.176 m Patient Age:    75 years     BP:           135/57 mmHg Patient Gender: F            HR:  86 bpm. Exam Location:  Inpatient  Procedure: 2D Echo, Cardiac Doppler and Color Doppler  Indications:    CHF  History:        Patient has prior history of Echocardiogram examinations, most recent 06/01/2018. CAD, Arrythmias:Atrial Fibrillation; Risk Factors:Sleep Apnea, Diabetes and Hypertension.  Sonographer:    Eduard Roux Referring Phys: [id number] RONDELL A SMITH  IMPRESSIONS   1. Left ventricular ejection fraction, by estimation, is 60 to 65%. The left ventricle has normal function. The left ventricle has no regional wall motion abnormalities. Left ventricular diastolic  parameters are indeterminate. 2. Right ventricular systolic function is normal. The right ventricular size is normal. 3. The mitral valve is normal in structure. Trivial mitral valve regurgitation. No evidence of mitral stenosis. 4. The aortic valve is tricuspid. Aortic valve regurgitation is not visualized. No aortic stenosis is present. 5. The inferior vena cava is normal in size with greater than 50% respiratory variability, suggesting right atrial pressure of 3 mmHg.  FINDINGS Left Ventricle: Left ventricular ejection fraction, by estimation, is 60 to 65%. The left ventricle has normal function. The left ventricle has no regional wall motion abnormalities. The left ventricular internal cavity size was normal in size. There is no left ventricular hypertrophy. Left ventricular diastolic parameters are indeterminate.  Right Ventricle: The right ventricular size is normal. Right ventricular systolic function is normal.  Left Atrium: Left atrial size was normal in size.  Right Atrium: Right atrial size was normal in size.  Pericardium: There is no evidence of pericardial effusion.  Mitral Valve: The mitral valve is normal in structure. Mild mitral annular calcification. Trivial mitral valve regurgitation. No evidence of mitral valve stenosis.  Tricuspid Valve: The tricuspid valve is normal in structure. Tricuspid valve regurgitation is trivial. No evidence of tricuspid stenosis.  Aortic Valve: The aortic valve is tricuspid. Aortic valve regurgitation is not visualized. No aortic stenosis is present. Aortic valve peak gradient measures 12.3 mmHg.  Pulmonic Valve: The pulmonic valve was not well visualized. Pulmonic valve regurgitation is not visualized. No evidence of pulmonic stenosis.  Aorta: The aortic root is normal in size and structure.  Venous: The inferior vena cava is normal in size with greater than 50% respiratory variability, suggesting right atrial pressure of 3  mmHg.  IAS/Shunts: No atrial level shunt detected by color flow Doppler.  Additional Comments: A device lead is visualized.   LEFT VENTRICLE PLAX 2D LVIDd:         5.30 cm LVIDs:         2.70 cm LV PW:         1.10 cm LV IVS:        1.10 cm LVOT diam:     1.80 cm LV SV:         83 LV SV Index:   38 LVOT Area:     2.54 cm   RIGHT VENTRICLE          IVC RV Basal diam:  3.00 cm  IVC diam: 1.30 cm  LEFT ATRIUM             Index        RIGHT ATRIUM           Index LA diam:        4.20 cm 1.93 cm/m   RA Area:     17.40 cm LA Vol (A2C):   72.2 ml 33.17 ml/m  RA Volume:   44.30 ml  20.36 ml/m LA Vol (A4C):  62.9 ml 28.90 ml/m LA Biplane Vol: 67.2 ml 30.88 ml/m AORTIC VALVE                  PULMONIC VALVE AV Area (Vmax): 2.39 cm      PV Vmax:       0.94 m/s AV Vmax:        175.60 cm/s   PV Peak grad:  3.6 mmHg AV Peak Grad:   12.3 mmHg LVOT Vmax:      165.00 cm/s LVOT Vmean:     109.500 cm/s LVOT VTI:       0.327 m  AORTA Ao Root diam: 3.50 cm Ao Asc diam:  3.60 cm   SHUNTS Systemic VTI:  0.33 m Systemic Diam: 1.80 cm  Olga Millers MD Electronically signed by Olga Millers MD Signature Date/Time: 12/10/2021/3:56:22 PM    Final   TEE  ECHO TEE 09/29/2016  Narrative *[place]* *[facility]* [address] [place], [place] [address] [phone]  ------------------------------------------------------------------- Transesophageal Echocardiography  Patient:    Rossalyn, Courtade MR #:       [id number] Study Date: 09/29/2016 Gender:     F Age:        70 Height: Weight: BSA: Pt. Status: Room:       [facility]  PERFORMING   Donato Schultz, M.D. ADMITTING    Will Elberta Fortis, MD ATTENDING    Loman Brooklyn, MD ORDERING     Loman Brooklyn, MD REFERRING    Loman Brooklyn, MD SONOGRAPHER  [facility]  cc:  ------------------------------------------------------------------- LV EF: 55% -    60%  ------------------------------------------------------------------- Indications:      Atrial fibrillation - 427.31.  ------------------------------------------------------------------- History:   PMH:   Transient ischemic attack.  Risk factors: obesity. Hypertension. Dyslipidemia.  ------------------------------------------------------------------- Study Conclusions  - Left ventricle: The cavity size was normal. Wall thickness was normal. Systolic function was normal. The estimated ejection fraction was in the range of 55% to 60%. - Aortic valve: No evidence of vegetation. - Mitral valve: No evidence of vegetation. There was mild regurgitation. - Left atrium: No evidence of thrombus in the atrial cavity or appendage. No evidence of thrombus in the appendage. no mass. - Right atrium: No evidence of thrombus in the atrial cavity or appendage. - Atrial septum: No defect or patent foramen ovale was identified. Echo contrast study showed no right-to-left atrial level shunt, following an increase in RA pressure induced by provocative maneuvers. - Tricuspid valve: No evidence of vegetation. - Line: A venous pacing wire was visualized in the right atrial cavity and right ventricular cavity, with its tip at the RV apex. - Superior vena cava: The study excluded a thrombus.  ------------------------------------------------------------------- Study data:   Study status:  Routine.  Consent:  The risks, benefits, and alternatives to the procedure were explained to the patient and informed consent was obtained.  Procedure:  Initial setup. The patient was brought to the laboratory. Surface ECG leads were monitored. Sedation. Conscious sedation was administered. Transesophageal echocardiography. Topical anesthesia was obtained using viscous lidocaine. An adult multiplane transesophageal probe was inserted by the attending cardiologist. Image quality was adequate.  Study completion:  The  patient tolerated the procedure well. There were no complications.  Administered medications: Fentanyl, , IV.  Midazolam, 4mg , IV.          Diagnostic transesophageal echocardiography.  2D and color Doppler. Birthdate:  Patient birthdate: [birth year].  Age:  Patient is 77 yr old.  Sex:  Gender: female.  Blood pressure:  144/81  Patient status:  Inpatient.  Study date:  Study date: 09/29/2016. Study time: 09:19 AM.  Location:  Endoscopy.  -------------------------------------------------------------------  ------------------------------------------------------------------- Left ventricle:  The cavity size was normal. Wall thickness was normal. Systolic function was normal. The estimated ejection fraction was in the range of 55% to 60%.  ------------------------------------------------------------------- Aortic valve:   Structurally normal valve.   Cusp separation was normal.  No evidence of vegetation.  Doppler:  There was no regurgitation.  ------------------------------------------------------------------- Aorta:  The aorta was normal, not dilated, and non-diseased.  ------------------------------------------------------------------- Mitral valve:   Mildly thickened leaflets . Leaflet separation was normal.  No evidence of vegetation.  Doppler:  There was mild regurgitation.  ------------------------------------------------------------------- Left atrium:  Prominent ridge posterior to LA appendage (normal variant). The atrium was normal in size.  No evidence of thrombus in the atrial cavity or appendage.  No evidence of thrombus in the appendage. The appendage was of normal size. Emptying velocity was normal.  ------------------------------------------------------------------- Atrial septum:  No defect or patent foramen ovale was identified. Echo contrast study showed no right-to-left atrial level shunt, following an increase in RA pressure induced by  provocative maneuvers.  ------------------------------------------------------------------- Right ventricle:  The cavity size was normal. Wall thickness was normal. Pacer wire or catheter noted in right ventricle. Systolic function was normal.  ------------------------------------------------------------------- Pulmonic valve:    Doppler:  There was trivial regurgitation.  ------------------------------------------------------------------- Tricuspid valve:   Structurally normal valve.   Leaflet separation was normal.  No evidence of vegetation.  Doppler:  There was mild regurgitation.  ------------------------------------------------------------------- Right atrium:  The atrium was normal in size. Pacer wire or catheter noted in right atrium.  No evidence of thrombus in the atrial cavity or appendage.  ------------------------------------------------------------------- Pericardium:  The pericardium was normal in appearance. There was no pericardial effusion.  ------------------------------------------------------------------- Systemic veins: Line: A venous pacing wire was visualized in the right atrial cavity and right ventricular cavity, with its tip at the RV apex. Superior vena cava: The study excluded a thrombus.  ------------------------------------------------------------------- Post procedure conclusions Ascending Aorta:  - The aorta was normal, not dilated, and non-diseased.  ------------------------------------------------------------------- Prepared and Electronically Authenticated by  Donato Schultz, M.D. 2018-03-30T10:35:04    CT SCANS  CT CORONARY MORPH W/CTA COR W/SCORE 07/04/2017  Addendum 07/05/2017  2:25 PM ADDENDUM REPORT: 07/05/2017 14:23  CLINICAL DATA:  77 year old female with h/o atrial fibrillation ablation now presenting with DOE.  EXAM: Cardiac CT/CTA  TECHNIQUE: The patient was scanned on a Siemens Somatom scanner.  COMPARISON:  Cardiac  CTA from 04/09/2017.  FINDINGS: A 120 kV prospective scan was triggered in the descending thoracic aorta at 111 HU's. Gantry rotation speed was 280 msecs and collimation was .9 mm. No beta blockade and no NTG was given. The 3D data set was reconstructed in 5% intervals of the 60-80 % of the R-R cycle. Diastolic phases were analyzed on a dedicated work station using MPR, MIP and VRT modes. The patient received 80 cc of contrast.  There is normal pulmonary vein drainage into the left atrium (2 on the right and 2 on the left) with ostial measurements as follows:  RUPV:  21 x 17 mm (previously 21 x 19 mm)  RLPV:  18 x 14 mm (previously 20 x 14 mm)  LUPV:  22 x 18 mm (previously 19 x 17 mm)  LLPV:  19 x 18 mm (previously 19 x 16 mm)  The left atrial appendage is large with chicken wing morphology and one major lobe with ostial  size 30 x 26 mm and length 46 mm. There is no thrombus in the left atrial appendage.  The esophagus runs to the left from the left atrial midline and is in the proximity to the LLPV.  Aorta:  Normal caliber.  No dissection, minimal calcifications.  Aortic Valve:  Trileaflet.  Trivial calcifications.  Coronary Arteries: Normal coronary origin. Right dominance. The study was performed without use of NTG and insufficient for plaque evaluation.  Dilated pulmonary artery measuring 32 mm suggestive of pulmonary hypertension.  IMPRESSION: 1. There is normal pulmonary vein drainage into the left atrium. There is no evidence for pulmonary vein stenosis that is defined as 50% narrowing of the lumen when compared to the prior study.  2. The left atrial appendage is large with chicken wing morphology and one major lobe with ostial size 30 x 26 mm and length 46 mm. There is no thrombus in the left atrial appendage.  3. The esophagus runs to the left from the left atrial midline and is in the proximity to the LLPV.  4. Dilated pulmonary artery measuring 32 mm  suggestive of pulmonary hypertension.   Electronically Signed By: Tobias Alexander On: 07/05/2017 14:23  Narrative EXAM: OVER-READ INTERPRETATION  CT CHEST  The following report is an over-read performed by radiologist Dr. Royal Piedra [facility] on 07/04/2017. This over-read does not include interpretation of cardiac or coronary anatomy or pathology. The coronary calcium score/coronary CTA interpretation by the cardiologist is attached.  COMPARISON:  Chest CT 04/09/2017.  FINDINGS: Aortic atherosclerosis. Within the visualized portions of the thorax there are no suspicious appearing pulmonary nodules or masses, there is no acute consolidative airspace disease, no pleural effusions, no pneumothorax and no lymphadenopathy. Visualized portions of the upper abdomen are unremarkable. There are no aggressive appearing lytic or blastic lesions noted in the visualized portions of the skeleton.  IMPRESSION: 1.  Aortic Atherosclerosis (ICD10-I70.0).  Electronically Signed: By: Trudie Reed M.D. On: 07/04/2017 10:49           Physical Exam:    VS:  BP 100/64   Pulse 74   Ht 5' 6.5" (1.689 m)   Wt 204 lb 9.6 oz (92.8 kg)   SpO2 93%   BMI 32.53 kg/m    Wt Readings from Last 3 Encounters:  05/14/23 204 lb 9.6 oz (92.8 kg)  02/28/23 212 lb (96.2 kg)  01/18/23 218 lb 9.6 oz (99.2 kg)    Gen: no distress, obese  Neck: No JVD at 90 degrees or with HJR Cardiac: No Rubs or Gallops, systolic murmur, distant heart sounds +2 radial pulses Respiratory: Rhonchi bilaterally, normal effort, normal  respiratory rate GI: Soft, nontender, non-distended  MS: No  edema;  moves all extremities normal gait Integument: Skin feels warm Neuro:  At time of evaluation, alert and oriented to person/place/time/situation  Psych: Normal affect, patient feels well   ASSESSMENT AND PLAN: .    Mixed Pulmonary Hypertension - Mixed pulmonary hypertension (Group 2 and 3)  related to interstitial lung disease. Requires 3L nasal cannula during the day and 3L at night with CPAP. NYHA class II-III. Six-minute walk test: 420 meters, personally performed - Discussed symptom monitoring and potential need for advanced heart failure follow-up if symptoms worsen - Reveal Lite Score 7 pending repeat BMP and BNP. - Follow up with advanced heart failure if symptoms worsen or if Reveal Lite score elevates - Consider referral to Dr. Sherlie Ban if lab results indicate higher risk  Heart Failure with  Preserved Ejection Fraction (chronic) - NYHA II-III - Chronic heart failure with preserved ejection fraction. Managed with torsemide, metolazone, potassium, Jardiance, spironolactone, and metoprolol 50 mg. No significant bradycardia. Discussed medication adherence and symptom monitoring. - Continue current medications: torsemide 60 mg daily, metolazone as needed, potassium 20 mcg, Jardiance, spironolactone 25 mg daily, metoprolol 50 mg  Coronary Artery Disease - Coronary artery disease with a bare metal stent (2004). Small CTO in the acute marginal branch with collateral flow. LDL at goal on atorvastatin 40 mg and Zetia 10 mg. Discussed secondary prevention strategies and maintaining current medication regimen. - Continue atorvastatin 40 mg and Zetia 10 mg - Focus on secondary prevention, she Is asymptomatic  Paroxysmal Atrial Fibrillation - Paroxysmal atrial fibrillation, status post multiple ablations and cardioversion. Managed on Tikosyn and diltiazem 360 mg. EKG: sinus rhythm with first-degree heart block, improved QTc and PR interval. Discussed regular monitoring and medication adherence. - Continue Tikosyn and diltiazem 360 mg - Continue Eliquis 5 mg - send message to her EP as courtesy - she is s/p PPM (Dr. Ladona Ridgel) without pacing need)  Obstructive Sleep Apnea - Obstructive sleep apnea managed with CPAP. Encouraged continued CPAP use.   Morbid Obesity Morbid obesity  managed with Mounjaro. Lost approximately 70 pounds. Discussed strategies to avoid sarcopenia: fiber intake, protein intake, and light exercise. Emphasized maintaining weight loss and monitoring Mounjaro side effects. - Continue Mounjaro - Increase fiber intake - Increase protein intake to 1.5-2.5 g/kg - Incorporate light exercise, such as using 2 lb weights while watching TV  General Health Maintenance Discussed diet and exercise, emphasizing fiber and protein intake for overall health and sarcopenia prevention. - Order blood work - Discuss fiber and protein intake - Encourage light exercise  Follow-up - Follow up in 6 months with me or Alden Server  Time Spent Directly with Patient:   I have spent a total of 42 minutes with the patient reviewing notes, imaging, EKGs, labs and examining the patient as well as establishing an assessment and plan that was discussed personally with the patient. Discussed disease state education ,and personally performed . Reviewed care and plan in collaboration with EP and PCCM    Riley Lam, MD FASE [facility] Cardiologist [facility]  [address] [place], [address] [place], [place] [address] [phone]  9:02 AM

## 2023-05-14 NOTE — Patient Instructions (Signed)
Medication Instructions:  Your physician recommends that you continue on your current medications as directed. Please refer to the Current Medication list given to you today.  *If you need a refill on your cardiac medications before your next appointment, please call your pharmacy*   Lab Work: BMP, BNP  If you have labs (blood work) drawn today and your tests are completely normal, you will receive your results only by: MyChart Message (if you have MyChart) OR A paper copy in the mail If you have any lab test that is abnormal or we need to change your treatment, we will call you to review the results.   Testing/Procedures: NONE   Follow-Up: At [facility], you and your health needs are our priority.  As part of our continuing mission to provide you with exceptional heart care, we have created designated Provider Care Teams.  These Care Teams include your primary Cardiologist (physician) and Advanced Practice Providers (APPs -  Physician Assistants and Nurse Practitioners) who all work together to provide you with the care you need, when you need it.   Your next appointment:   6 month(s)  Provider:   Riley Lam, MD  or Robin Searing, NP

## 2023-05-15 ENCOUNTER — Other Ambulatory Visit: Payer: Self-pay | Admitting: Pharmacist

## 2023-05-15 ENCOUNTER — Other Ambulatory Visit (HOSPITAL_COMMUNITY): Payer: Self-pay

## 2023-05-15 DIAGNOSIS — E1169 Type 2 diabetes mellitus with other specified complication: Secondary | ICD-10-CM

## 2023-05-15 LAB — BASIC METABOLIC PANEL
BUN/Creatinine Ratio: 25 (ref 12–28)
BUN: 37 mg/dL — ABNORMAL HIGH (ref 8–27)
CO2: 22 mmol/L (ref 20–29)
Calcium: 10 mg/dL (ref 8.7–10.3)
Chloride: 91 mmol/L — ABNORMAL LOW (ref 96–106)
Creatinine, Ser: 1.48 mg/dL — ABNORMAL HIGH (ref 0.57–1.00)
Glucose: 118 mg/dL — ABNORMAL HIGH (ref 70–99)
Potassium: 4.5 mmol/L (ref 3.5–5.2)
Sodium: 136 mmol/L (ref 134–144)
eGFR: 36 mL/min/{1.73_m2} — ABNORMAL LOW (ref >59)

## 2023-05-15 LAB — PRO B NATRIURETIC PEPTIDE: NT-Pro BNP: 853 pg/mL — ABNORMAL HIGH (ref 0–738)

## 2023-05-15 MED ORDER — AREXVY 120 MCG/0.5ML IM SUSR
INTRAMUSCULAR | 0 refills | Status: DC
  Filled 2023-05-15: qty 1, 1d supply, fill #0
  Filled 2023-05-16: qty 1, fill #0

## 2023-05-16 ENCOUNTER — Other Ambulatory Visit (HOSPITAL_COMMUNITY): Payer: Self-pay

## 2023-05-16 MED ORDER — MOUNJARO 15 MG/0.5ML ~~LOC~~ SOAJ
15.0000 mg | SUBCUTANEOUS | 11 refills | Status: DC
Start: 2023-05-16 — End: 2024-03-25
  Filled 2023-05-16: qty 2, 28d supply, fill #0
  Filled 2023-06-06 (×2): qty 2, 28d supply, fill #1
  Filled 2023-07-04 – 2023-07-06 (×2): qty 2, 28d supply, fill #2
  Filled 2023-08-01: qty 2, 28d supply, fill #3
  Filled 2023-08-24: qty 2, 28d supply, fill #4
  Filled 2023-09-21: qty 2, 28d supply, fill #5
  Filled 2023-10-19: qty 2, 28d supply, fill #6
  Filled 2023-11-12: qty 2, 28d supply, fill #7
  Filled 2023-12-10: qty 2, 28d supply, fill #8
  Filled 2024-01-07: qty 2, 28d supply, fill #9
  Filled 2024-02-01: qty 2, 28d supply, fill #10
  Filled 2024-02-29: qty 2, 28d supply, fill #11

## 2023-05-18 ENCOUNTER — Other Ambulatory Visit: Payer: Self-pay

## 2023-05-19 ENCOUNTER — Other Ambulatory Visit (HOSPITAL_COMMUNITY): Payer: Self-pay

## 2023-05-21 ENCOUNTER — Other Ambulatory Visit (HOSPITAL_COMMUNITY): Payer: Self-pay

## 2023-05-21 ENCOUNTER — Other Ambulatory Visit: Payer: Self-pay

## 2023-05-22 ENCOUNTER — Other Ambulatory Visit (HOSPITAL_COMMUNITY): Payer: Self-pay

## 2023-05-22 ENCOUNTER — Telehealth: Payer: Self-pay

## 2023-05-22 DIAGNOSIS — I272 Pulmonary hypertension, unspecified: Secondary | ICD-10-CM

## 2023-05-22 NOTE — Telephone Encounter (Signed)
The patient has been notified of the result and verbalized understanding.  All questions (if any) were answered. Macie Burows, RN 05/22/2023 2:41 PM

## 2023-05-22 NOTE — Telephone Encounter (Signed)
-----   Message from Christell Constant sent at 05/18/2023  2:41 PM EST ----- Results: REVEAL LITE 2 Risk Score 9 Plan: Transition back to Dr. Gevena Cotton, MD

## 2023-05-24 ENCOUNTER — Other Ambulatory Visit: Payer: Self-pay

## 2023-05-24 ENCOUNTER — Other Ambulatory Visit (HOSPITAL_COMMUNITY): Payer: Self-pay

## 2023-05-24 ENCOUNTER — Other Ambulatory Visit: Payer: Self-pay | Admitting: Cardiovascular Disease

## 2023-05-24 MED ORDER — METOPROLOL TARTRATE 50 MG PO TABS
ORAL_TABLET | ORAL | 3 refills | Status: DC
  Filled 2023-05-24: qty 270, 90d supply, fill #0
  Filled 2023-08-27: qty 270, 90d supply, fill #1
  Filled 2023-11-27: qty 270, 90d supply, fill #2
  Filled 2024-02-21: qty 270, 90d supply, fill #3

## 2023-05-24 MED ORDER — DOFETILIDE 250 MCG PO CAPS
250.0000 ug | ORAL_CAPSULE | Freq: Two times a day (BID) | ORAL | 3 refills | Status: DC
  Filled 2023-05-24: qty 180, 90d supply, fill #0
  Filled 2023-08-22: qty 180, 90d supply, fill #1
  Filled 2023-11-20: qty 180, 90d supply, fill #2
  Filled 2024-02-13: qty 180, 90d supply, fill #3

## 2023-05-24 MED FILL — Metformin HCl Tab 1000 MG: ORAL | 90 days supply | Qty: 180 | Fill #1 | Status: AC

## 2023-05-24 MED FILL — Atorvastatin Calcium Tab 40 MG (Base Equivalent): ORAL | 90 days supply | Qty: 90 | Fill #1 | Status: AC

## 2023-05-25 ENCOUNTER — Other Ambulatory Visit: Payer: Self-pay | Admitting: Family

## 2023-05-28 ENCOUNTER — Other Ambulatory Visit: Payer: Self-pay

## 2023-06-01 ENCOUNTER — Other Ambulatory Visit: Payer: Self-pay

## 2023-06-02 ENCOUNTER — Other Ambulatory Visit (HOSPITAL_COMMUNITY): Payer: Self-pay

## 2023-06-04 ENCOUNTER — Other Ambulatory Visit (HOSPITAL_COMMUNITY): Payer: Self-pay

## 2023-06-04 ENCOUNTER — Other Ambulatory Visit: Payer: Self-pay

## 2023-06-05 ENCOUNTER — Other Ambulatory Visit: Payer: Self-pay | Admitting: Family

## 2023-06-05 ENCOUNTER — Other Ambulatory Visit: Payer: Self-pay | Admitting: Internal Medicine

## 2023-06-05 ENCOUNTER — Telehealth: Payer: Self-pay | Admitting: Internal Medicine

## 2023-06-05 ENCOUNTER — Other Ambulatory Visit (HOSPITAL_COMMUNITY): Payer: Self-pay

## 2023-06-05 ENCOUNTER — Other Ambulatory Visit: Payer: Self-pay

## 2023-06-05 DIAGNOSIS — E785 Hyperlipidemia, unspecified: Secondary | ICD-10-CM

## 2023-06-05 DIAGNOSIS — Z79899 Other long term (current) drug therapy: Secondary | ICD-10-CM

## 2023-06-05 DIAGNOSIS — I2510 Atherosclerotic heart disease of native coronary artery without angina pectoris: Secondary | ICD-10-CM

## 2023-06-05 MED ORDER — EZETIMIBE 10 MG PO TABS
10.0000 mg | ORAL_TABLET | Freq: Every day | ORAL | 3 refills | Status: DC
  Filled 2023-06-05: qty 90, 90d supply, fill #0
  Filled 2023-08-27: qty 90, 90d supply, fill #1
  Filled 2023-11-26: qty 90, 90d supply, fill #2
  Filled 2024-02-25: qty 90, 90d supply, fill #3

## 2023-06-05 MED ORDER — DILTIAZEM HCL ER 360 MG PO TB24
360.0000 mg | ORAL_TABLET | Freq: Every day | ORAL | 3 refills | Status: DC
  Filled 2023-06-05: qty 90, 90d supply, fill #0
  Filled 2023-08-27: qty 90, 90d supply, fill #1
  Filled 2023-11-27: qty 90, 90d supply, fill #2
  Filled 2024-03-08 – 2024-03-10 (×2): qty 90, 90d supply, fill #3

## 2023-06-05 MED ORDER — POTASSIUM CHLORIDE CRYS ER 20 MEQ PO TBCR
20.0000 meq | EXTENDED_RELEASE_TABLET | Freq: Every day | ORAL | 3 refills | Status: DC
  Filled 2023-06-05: qty 90, 90d supply, fill #0
  Filled 2023-08-27: qty 90, 90d supply, fill #1

## 2023-06-05 NOTE — Telephone Encounter (Signed)
 RX sent to requested Pharmacy

## 2023-06-05 NOTE — Telephone Encounter (Signed)
*  STAT* If patient is at the pharmacy, call can be transferred to refill team.   1. Which medications need to be refilled? (please list name of each medication and dose if known) potassium chloride SA (KLOR-CON M) 20 MEQ tablet  diltiazem (CARDIZEM LA) 360 MG 24 hr tablet ezetimibe (ZETIA) 10 MG tablet  2. Which pharmacy/location (including street and city if local pharmacy) is medication to be sent to? [facility]    3. Do they need a 30 day or 90 day supply?   90 day supply  Patient is completely out of medication.

## 2023-06-06 ENCOUNTER — Other Ambulatory Visit: Payer: Self-pay

## 2023-06-06 ENCOUNTER — Other Ambulatory Visit: Payer: Self-pay | Admitting: Internal Medicine

## 2023-06-06 ENCOUNTER — Other Ambulatory Visit (HOSPITAL_COMMUNITY): Payer: Self-pay

## 2023-06-06 MED ORDER — OMEPRAZOLE 20 MG PO CPDR
20.0000 mg | DELAYED_RELEASE_CAPSULE | Freq: Two times a day (BID) | ORAL | 3 refills | Status: DC
  Filled 2023-06-06: qty 60, 30d supply, fill #0
  Filled 2023-08-27: qty 60, 30d supply, fill #1
  Filled 2023-10-29: qty 60, 30d supply, fill #2
  Filled 2023-12-28: qty 60, 30d supply, fill #3

## 2023-06-06 NOTE — Telephone Encounter (Signed)
I am not the prescriber of this medicine . Please send request to her cardiology team

## 2023-06-07 ENCOUNTER — Other Ambulatory Visit (HOSPITAL_COMMUNITY): Payer: Self-pay

## 2023-06-07 ENCOUNTER — Other Ambulatory Visit: Payer: Self-pay | Admitting: Cardiovascular Disease

## 2023-06-07 NOTE — Telephone Encounter (Signed)
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 Please review thanks

## 2023-06-08 ENCOUNTER — Other Ambulatory Visit (HOSPITAL_COMMUNITY): Payer: Self-pay

## 2023-06-08 MED ORDER — TORSEMIDE 20 MG PO TABS
60.0000 mg | ORAL_TABLET | Freq: Every day | ORAL | 3 refills | Status: AC
  Filled 2023-06-08: qty 270, 90d supply, fill #0
  Filled 2023-09-03: qty 270, 90d supply, fill #1
  Filled 2023-12-01: qty 270, 90d supply, fill #2
  Filled 2024-02-29: qty 270, 90d supply, fill #3

## 2023-06-21 ENCOUNTER — Other Ambulatory Visit: Payer: Self-pay | Admitting: Internal Medicine

## 2023-06-21 ENCOUNTER — Other Ambulatory Visit: Payer: Self-pay

## 2023-06-21 DIAGNOSIS — E1165 Type 2 diabetes mellitus with hyperglycemia: Secondary | ICD-10-CM

## 2023-06-21 MED ORDER — EMPAGLIFLOZIN 25 MG PO TABS
25.0000 mg | ORAL_TABLET | Freq: Every day | ORAL | 0 refills | Status: DC
  Filled 2023-06-21: qty 90, 90d supply, fill #0

## 2023-06-22 ENCOUNTER — Other Ambulatory Visit: Payer: Self-pay

## 2023-06-22 ENCOUNTER — Other Ambulatory Visit (HOSPITAL_COMMUNITY): Payer: Self-pay

## 2023-07-03 ENCOUNTER — Other Ambulatory Visit (HOSPITAL_COMMUNITY): Payer: Self-pay

## 2023-07-04 ENCOUNTER — Other Ambulatory Visit: Payer: Self-pay

## 2023-07-05 ENCOUNTER — Other Ambulatory Visit: Payer: Self-pay

## 2023-07-05 ENCOUNTER — Other Ambulatory Visit (HOSPITAL_COMMUNITY): Payer: Self-pay

## 2023-07-06 ENCOUNTER — Other Ambulatory Visit (HOSPITAL_COMMUNITY): Payer: Self-pay

## 2023-07-13 ENCOUNTER — Ambulatory Visit: Payer: Medicare Other

## 2023-07-16 NOTE — Progress Notes (Signed)
 Pt came in for Dexcom sensor to change into different phone.   Pt reports she has a dexcom sensor that is connected to her old phone. which is working fine. Pt would like to switch it to a different phone.   Work with patient to switch it. Couldn't figure out the option to transfer the sensor to a different phone. The only option found was to stop the sensor and if stop, it stated it would not able to restart again.   Advise pt that she would need to complete using the sensor until a new sensor is need to change. Then she can connect it to her new phone. Pt verbalized understanding.

## 2023-07-18 ENCOUNTER — Other Ambulatory Visit (HOSPITAL_COMMUNITY): Payer: Self-pay

## 2023-07-18 ENCOUNTER — Telehealth: Payer: Self-pay | Admitting: Pharmacy Technician

## 2023-07-18 NOTE — Telephone Encounter (Signed)
 Pharmacy Patient Advocate Encounter   Received notification from CoverMyMeds that prior authorization for Mounjaro  15mg  is required/requested.   Insurance verification completed.   The patient is insured through [facility] .   Per test claim: Refill too soon. PA is not needed at this time. Medication was filled 07/06/23. Next eligible fill date is 07/27/23.

## 2023-07-31 ENCOUNTER — Other Ambulatory Visit (HOSPITAL_COMMUNITY): Payer: Self-pay

## 2023-08-03 ENCOUNTER — Other Ambulatory Visit (HOSPITAL_COMMUNITY): Payer: Self-pay

## 2023-08-03 ENCOUNTER — Ambulatory Visit (HOSPITAL_COMMUNITY)
Admission: RE | Admit: 2023-08-03 | Discharge: 2023-08-03 | Disposition: A | Discharge status: Home / Self Care | Payer: Medicare Other | Source: Ambulatory Visit | Attending: Cardiology | Admitting: Cardiology

## 2023-08-03 ENCOUNTER — Encounter: Payer: Self-pay | Admitting: *Deleted

## 2023-08-03 ENCOUNTER — Encounter (HOSPITAL_COMMUNITY): Payer: Self-pay | Admitting: Cardiology

## 2023-08-03 VITALS — BP 110/64 | HR 59 | Wt 195.0 lb

## 2023-08-03 DIAGNOSIS — Z006 Encounter for examination for normal comparison and control in clinical research program: Secondary | ICD-10-CM

## 2023-08-03 DIAGNOSIS — I480 Paroxysmal atrial fibrillation: Secondary | ICD-10-CM | POA: Diagnosis not present

## 2023-08-03 DIAGNOSIS — I2510 Atherosclerotic heart disease of native coronary artery without angina pectoris: Secondary | ICD-10-CM | POA: Insufficient documentation

## 2023-08-03 DIAGNOSIS — I272 Pulmonary hypertension, unspecified: Secondary | ICD-10-CM

## 2023-08-03 DIAGNOSIS — J069 Acute upper respiratory infection, unspecified: Secondary | ICD-10-CM | POA: Insufficient documentation

## 2023-08-03 DIAGNOSIS — I495 Sick sinus syndrome: Secondary | ICD-10-CM | POA: Insufficient documentation

## 2023-08-03 DIAGNOSIS — J9611 Chronic respiratory failure with hypoxia: Secondary | ICD-10-CM | POA: Insufficient documentation

## 2023-08-03 DIAGNOSIS — I5032 Chronic diastolic (congestive) heart failure: Secondary | ICD-10-CM

## 2023-08-03 LAB — BASIC METABOLIC PANEL
Anion gap: 15 (ref 5–15)
BUN: 32 mg/dL — ABNORMAL HIGH (ref 8–23)
CO2: 25 mmol/L (ref 22–32)
Calcium: 9.7 mg/dL (ref 8.9–10.3)
Chloride: 96 mmol/L — ABNORMAL LOW (ref 98–111)
Creatinine, Ser: 1.25 mg/dL — ABNORMAL HIGH (ref 0.44–1.00)
GFR, Estimated: 44 mL/min — ABNORMAL LOW (ref >60)
Glucose, Bld: 107 mg/dL — ABNORMAL HIGH (ref 70–99)
Potassium: 4.9 mmol/L (ref 3.5–5.1)
Sodium: 136 mmol/L (ref 135–145)

## 2023-08-03 LAB — LIPID PANEL
Cholesterol: 111 mg/dL (ref 0–200)
HDL: 28 mg/dL — ABNORMAL LOW (ref >40)
LDL Cholesterol: 45 mg/dL (ref 0–99)
Total CHOL/HDL Ratio: 4 {ratio}
Triglycerides: 190 mg/dL — ABNORMAL HIGH (ref <150)
VLDL: 38 mg/dL (ref 0–40)

## 2023-08-03 LAB — BRAIN NATRIURETIC PEPTIDE: B Natriuretic Peptide: 80.4 pg/mL (ref 0.0–100.0)

## 2023-08-03 MED ORDER — DOXYCYCLINE HYCLATE 100 MG PO CAPS
100.0000 mg | ORAL_CAPSULE | Freq: Two times a day (BID) | ORAL | 0 refills | Status: DC
  Filled 2023-08-03: qty 14, 7d supply, fill #0

## 2023-08-03 NOTE — Patient Instructions (Signed)
START Doxycycline 100 mg Twice daily for 7 days.  Labs done today, your results will be available in MyChart, we will contact you for abnormal readings.  Your physician has requested that you have an echocardiogram. Echocardiography is a painless test that uses sound waves to create images of your heart. It provides your doctor with information about the size and shape of your heart and how well your heart's chambers and valves are working. This procedure takes approximately one hour. There are no restrictions for this procedure. Please do NOT wear cologne, perfume, aftershave, or lotions (deodorant is allowed). Please arrive 15 minutes prior to your appointment time.  Please note: We ask at that you not bring children with you during ultrasound (echo/ vascular) testing. Due to room size and safety concerns, children are not allowed in the ultrasound rooms during exams. Our front office staff cannot provide observation of children in our lobby area while testing is being conducted. An adult accompanying a patient to their appointment will only be allowed in the ultrasound room at the discretion of the ultrasound technician under special circumstances. We apologize for any inconvenience.  You are scheduled for a Cardiac Catheterization on Tuesday, February 18 with Dr. Marca Ancona.  1. Please arrive at the [facility] (Main Entrance A) at [facility]: [address] [place], [place] [address] at 5:30 AM (This time is 2 hour(s) before your procedure to ensure your preparation).   Free valet parking service is available. You will check in at ADMITTING. The support person will be asked to wait in the waiting room.  It is OK to have someone drop you off and come back when you are ready to be discharged.    Special note: Every effort is made to have your procedure done on time. Please understand that emergencies sometimes delay scheduled procedures.  2. Diet: Do not eat solid foods after  midnight.  The patient may have clear liquids until 5am upon the day of the procedure.  3. Medication instructions in preparation for your procedure:   Contrast Allergy: No  HOLD YOUR ELIQUIS THE DAY BEFORE YOUR PROCEDURE AND THE DAY OF YOUR PROCEDURE.  HOLD YOUR JARDIANCE, SPIRONOLACTONE, TORSEMIDE AND MOUNJARO THE DAY OF THE PROCEDURE.  Take only 14 units of insulin the night before your procedure. Do not take any insulin on the day of the procedure.  Do not take Diabetes Med Glucophage (Metformin) on the day of the procedure and HOLD 48 HOURS AFTER THE PROCEDURE.  On the morning of your procedure, take any morning medicines NOT listed above.  You may use sips of water.  5. Plan to go home the same day, you will only stay overnight if medically necessary. 6. Bring a current list of your medications and current insurance cards. 7. You MUST have a responsible person to drive you home. 8. Someone MUST be with you the first 24 hours after you arrive home or your discharge will be delayed. 9. Please wear clothes that are easy to get on and off and wear slip-on shoes.  Your physician recommends that you schedule a follow-up appointment in: 6 weeks  If you have any questions or concerns before your next appointment please send [place] a message through [place] or call our office at [phone].    TO LEAVE A MESSAGE FOR THE NURSE SELECT OPTION 2, PLEASE LEAVE A MESSAGE INCLUDING: YOUR NAME DATE OF BIRTH CALL BACK NUMBER REASON FOR CALL**this is important as we prioritize the call  backs  YOU WILL RECEIVE A CALL BACK THE SAME DAY AS LONG AS YOU CALL BEFORE 4:00 PM  At the [facility], you and your health needs are our priority. As part of our continuing mission to provide you with exceptional heart care, we have created designated Provider Care Teams. These Care Teams include your primary Cardiologist (physician) and Advanced Practice Providers (APPs- Physician Assistants and  Nurse Practitioners) who all work together to provide you with the care you need, when you need it.   You may see any of the following providers on your designated Care Team at your next follow up: Dr Arvilla Meres Dr Marca Ancona Dr. Dorthula Nettles Dr. Clearnce Hasten Amy Filbert Schilder, NP Robbie Lis, [place] [facility] [place], [place] Brynda Peon, NP [place] Lee, NP Karle Plumber, PharmD   Please be sure to bring in all your medications bottles to every appointment.    Thank you for choosing [facility] \

## 2023-08-03 NOTE — Research (Signed)
Spoke with patient about level study  Answered any questions and gave her ICF and pamphlet about study  Will follow up with her in a week or so   Mercer Pod :) RN BSN  Clinical Research Nurse  Be strong and take heart, all you who hope in the [place]. ~ Psalm 31:24

## 2023-08-05 NOTE — H&P (View-Only) (Signed)
 PCP:  Madelin Headings, MD Cardiology: Dr. Izora Ribas HF Cardiology: Dr. Shirlee Latch  78 y.o. with history of CAD, paroxysmal atrial fibrillation, and HFpEF was referred by Dr. Izora Ribas for evaluation of CHF.  Patient had BMS to RCA in 2004, last coronary angiogram in 11/18 showed patent RCA stent with occluded acute marginal branch.  Patient has had 3 atrial fibrillation ablations and has been on amiodarone in the past.  Currently, she is on Tikosyn and is maintaining NSR.  She has tachy-brady syndrome with Abbott PPM.  She has been on home oxygen since 2020 with chronic hypoxemic respiratory failure.  Possible hypersensitivity pneumonitis by imaging. Follows with Dr. Marchelle Gearing, has not been on steroids or treatment for ILD.  She has OSA and uses CPAP at night.  She has a history of HFpEF, last echo showed EF 60-65% with normal RV in 6/23. RHCs done in 7/23 and 10/23 both showed primarily pulmonary venous hypertension, filling pressures significantly elevated in 7/23 but better-controlled in 10/23.    Patient is currently on torsemide 60 mg daily with metolazone for prn use.  She has not used metolazone recently.  Weight generally has been trending down.  She is short of breath walking "fast" or walking up stairs.  She generally walks slowly.  She is able to go to the grocery store and bring bags in the house, but she is tired and short of breath after finishing.  She short of breath after she finishes her shower and gets dressed, has to rest briefly.  No chest pain.  Rare lightheadedness. No palpitations. She is in NSR today. She has had a URI recently with coughing and congestion, no fever.   ECG (personally reviewed): NSR, 1st degree AVB, QTc 468 msec  Labs (9/24): LDL 42 Labs (11/24): pro-BNP 853, K 4.5, creatinine 1.48  PMH: 1. Type 2 diabetes 2. CAD: BMS to RCA in 2004.  - LHC (11/18): Patent RCA stent, CTO acute marginal branch 3. Atrial fibrillation: Paroxysmal.  S/p AF ablation x 3 in  8/19, 7/20, 1/22.  Prior amiodarone use.  4. OSA 5. Tachy-brady syndrome: Abbott dual chamber PPM.  6. CKD stage 3 7. Chronic hypoxemic respiratory failure: On home oxygen since 2020.  Suspect due to ILD, possible hypersensitivity pneumonitis.  - CCP antibody positive.  - HRCT chest (4/24): Scarring from prior infection versus ILD, possible hypersensitivity pneumonitis with upper lobe predominance.  - Has seen Dr. Marchelle Gearing, not on steroids or Ofev.  8. Cirrhosis by imaging.  9. HFpEF: Echo (6/23) with EF 60-65%, normal RV.  - RHC (7/23):mean RA 14, PA 63/24 mean 45, mean PCWP 25, CI 3.5, PVR 2.6.  - RHC (10/23): mean RA 4, PA 50/24 mean 35, mean PCWP 16, CI 2.99, PVR 2.97 WU 10. Type 2 diabetes  Social History   Socioeconomic History   Marital status: Married    Spouse name: Financial risk analyst   Number of children: 0   Years of education: HS   Highest education level: High school graduate  Occupational History   Occupation: retired    Comment: previously worked [employer]  Tobacco Use   Smoking status: Former    Current packs/day: 0.00    Average packs/day: 1 pack/day for 5.0 years (5.0 ttl pk-yrs)    Types: Cigarettes    Start date: 05/11/1978    Quit date: 07/03/1982    Years since quitting: 41.1   Smokeless tobacco: Never  Vaping Use   Vaping status: Never Used  Substance and Sexual  Activity   Alcohol use: Yes   Drug use: No   Sexual activity: Not Currently  Other Topics Concern   Not on file  Social History Narrative   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   Married    Originally from [place] of two, high school education   Former smoker 1979-1984 1ppd   Hunting dogs 7    Retired from [employer]   [id number]   [employer] 2    Social Drivers of Corporate investment banker Strain: Low Risk  (01/17/2023)   Overall Financial Resource Strain (CARDIA)    Difficulty of Paying Living Expenses: Not hard at all  Food Insecurity: No Food  Insecurity (01/17/2023)   Hunger Vital Sign    Worried About Running Out of Food in the Last Year: Never true    Ran Out of Food in the Last Year: Never true  Transportation Needs: No Transportation Needs (01/17/2023)   PRAPARE - Administrator, Civil Service (Medical): No    Lack of Transportation (Non-Medical): No  Physical Activity: Insufficiently Active (01/17/2023)   Exercise Vital Sign    Days of Exercise per Week: 3 days    Minutes of Exercise per Session: 20 min  Stress: No Stress Concern Present (01/17/2023)   [employer] of Occupational Health - Occupational Stress Questionnaire    Feeling of Stress : Not at all  Social Connections: Socially Integrated (01/17/2023)   Social Connection and Isolation Panel [NHANES]    Frequency of Communication with Friends and Family: More than three times a week    Frequency of Social Gatherings with Friends and Family: More than three times a week    Attends Religious Services: More than 4 times per year    Active Member of [employer] or Organizations: Yes    Attends Engineer, structural: More than 4 times per year    Marital Status: Married  Catering manager Violence: Not At Risk (01/17/2023)   Humiliation, Afraid, Rape, and Kick questionnaire    Fear of Current or Ex-Partner: No    Emotionally Abused: No    Physically Abused: No    Sexually Abused: No   Family History  Problem Relation Age of Onset   Suicidality Father        suicide death pt was 3 yrs, 1950   Arrhythmia Mother    Hypertension Mother    Diabetes Mother    Dementia Mother    Heart attack Brother    Heart disease Paternal Aunt    Prostate cancer Maternal Grandfather    Diabetes Paternal Grandfather        fathers side of the family   Colon cancer Maternal Aunt    ROS: All systems reviewed and negative except as per HPI.   Current Outpatient Medications  Medication Sig Dispense Refill   apixaban (ELIQUIS) 5 MG TABS tablet Take 1 tablet (5 mg  total) by mouth 2 (two) times daily. 180 tablet 1   atorvastatin (LIPITOR) 40 MG tablet Take 1 tablet (40 mg total) by mouth daily. 90 tablet 3   BD PEN NEEDLE NANO 2ND GEN 32G X 4 MM MISC daily.     Continuous Glucose Receiver (DEXCOM G7 RECEIVER) DEVI Use as directed while on insulin 1 each 3   Continuous Glucose Sensor (DEXCOM G7 SENSOR) MISC Apply 1 sensor to skin as directed to monitor blood sugars. Replace with new sensor every 10 days. 3 each 11  diltiazem (CARDIZEM LA) 360 MG 24 hr tablet Take 1 tablet (360 mg total) by mouth daily. 90 tablet 3   dofetilide (TIKOSYN) 250 MCG capsule Take 1 capsule (250 mcg total) by mouth 2 (two) times daily with breakfast and at bedtime. 180 capsule 3   doxycycline (VIBRAMYCIN) 100 MG capsule Take 1 capsule (100 mg total) by mouth 2 (two) times daily for 7 days. 14 capsule 0   DULoxetine (CYMBALTA) 60 MG capsule Take 1 capsule (60 mg total) by mouth daily. 90 capsule 3   empagliflozin (JARDIANCE) 25 MG TABS tablet Take 1 tablet (25 mg total) by mouth daily before breakfast. 90 tablet 0   ezetimibe (ZETIA) 10 MG tablet Take 1 tablet (10 mg total) by mouth daily. 90 tablet 3   fluticasone (FLONASE) 50 MCG/ACT nasal spray Place 1 spray into both nostrils daily as needed for allergies.     insulin degludec (TRESIBA FLEXTOUCH) 100 UNIT/ML FlexTouch Pen Inject 28 units into the skin daily as directed 9 mL 3   ipratropium (ATROVENT) 0.06 % nasal spray 1-2 puffs each nostril up to 4 times daily 15 mL 12   loratadine (CLARITIN) 10 MG tablet TAKE 1 TABLET BY MOUTH EVERY DAY 90 tablet 0   metFORMIN (GLUCOPHAGE) 1000 MG tablet Take 1 tablet (1,000 mg total) by mouth 2 (two) times daily. 180 tablet 3   metolazone (ZAROXOLYN) 2.5 MG tablet TAKE 1 TABLET BY MOUTH EVERY 4  DAYS AS NEEDED FOR SWELLING  PLEASE KEEP UPCOMING APPOINTMENT FURTHER REFILLS 25 tablet 2   metoprolol tartrate (LOPRESSOR) 50 MG tablet Take 1 tablet (50 mg total) by mouth every morning AND 2 tablets  (100 mg total) every evening. 270 tablet 3   nitroGLYCERIN (NITROSTAT) 0.4 MG SL tablet Place 1 tablet (0.4 mg total) under the tongue every 5 (five) minutes x 3 doses as needed for chest pain. 25 tablet 3   NON FORMULARY CPAP at bedtime     nystatin-triamcinolone (MYCOLOG II) cream Apply 1 application topically 2 (two) times daily as needed. 30 g 1   omeprazole (PRILOSEC) 20 MG capsule Take 1 capsule (20 mg total) by mouth 2 (two) times daily before breakfast and at bedtime. 60 capsule 3   ondansetron (ZOFRAN-ODT) 4 MG disintegrating tablet Take 1 tablet (4 mg total) by mouth every 8 (eight) hours as needed for nausea or vomiting. 20 tablet 1   Polyethyl Glycol-Propyl Glycol (SYSTANE) 0.4-0.3 % SOLN Place 1 drop into both eyes daily as needed (dry eyes).     potassium chloride SA (KLOR-CON M) 20 MEQ tablet Take 1 tablet (20 mEq total) by mouth daily. 90 tablet 3   spironolactone (ALDACTONE) 25 MG tablet Take 0.5 tablets (12.5 mg total) by mouth daily. 45 tablet 1   tirzepatide (MOUNJARO) 15 MG/0.5ML Pen Inject 15 mg into the skin once a week. 2 mL 11   torsemide (DEMADEX) 20 MG tablet Take 3 tablets (60 mg total) by mouth daily before breakfast 270 tablet 3   valACYclovir (VALTREX) 1000 MG tablet TAKE 2 TABLETS BY MOUTH EVERY 12 HOURS AS NEEDED 34 tablet 2   Current Facility-Administered Medications  Medication Dose Route Frequency Provider Last Rate Last Admin   sodium chloride flush (NS) 0.9 % injection 3 mL  3 mL Intravenous Q12H Gaston Islam., NP       BP 110/64   Pulse (!) 59   Wt 88.5 kg (195 lb)   SpO2 95% Comment: 4 l n/c  BMI 31.00  kg/m  General: NAD Neck: No JVD, no thyromegaly or thyroid nodule.  Lungs: Upper lobe predominant crackles. . CV: Nondisplaced PMI.  Heart regular S1/S2, no S3/S4, 2/6 SEM RUSB.  No peripheral edema.  No carotid bruit.  Normal pedal pulses.  Abdomen: Soft, nontender, no hepatosplenomegaly, no distention.  Skin: Intact without lesions or rashes.   Neurologic: Alert and oriented x 3.  Psych: Normal affect. Extremities: No clubbing or cyanosis.  HEENT: Normal.   Assessment/Plan: 1. Chronic diastolic CHF: Last echo in 6/23 showed EF 60-65%, normal RV. RHCs in 7/23 and 10/23 showed primarily pulmonary venous hypertension.  NYHA class III symptoms, but think her dyspnea is likely multifactorial => CHF, hypersensitivity pneumonitis/ILD, deconditioning. She does not look significantly volume overloaded on exam today.  - I think it would be reasonable to repeat RHC to reassess filling pressures and PA pressure.  If there appears to be a component of pulmonary arterial hypertension, may be worthwhile treating for pulmonary hypertension related to ILD with Tyvaso.  However, I suspect that her PH is primarily pulmonary venous hypertension not amenable to pulmonary vasodilators.  We discussed risks/benefits of RHC and she agrees to procedure.  - I am going to arrange for repeat echo.  If morphology looks concerning, would workup for cardiac amyloidosis (PYP scan, myeloma studies).  - Continue current torsemide 60 mg daily, check BMET/BNP today.  2. Chronic hypoxemic respiratory failure: She has been on home oxygen since 2020.  Suspect hypersensitivity pneumonitis based on HRCT chest in 4/24.  She follows with Dr. Marchelle Gearing for pulmonary.  3. OSA: Continue CPAP.  4. Atrial fibrillation: Paroxysmal.  Has had 3 AF ablations.  Currently on Tikosyn.  - Continue Tikosyn, QTc ok on ECG today.  - Continue apixaban.  5. CAD: No chest pain.  - Continue atorvastatin, check lipids today.  - On apixaban so no ASA.  6. Tachy-brady syndrome: Has Abbott PPM.  7. URI: Persistent URI symptoms with cough and congestion.  - I will give her 7 days worth of doxycycline.   Followup in 6 wks after studies.   I spent 61 minutes reviewing all the old records records, interviewing/examining patient, and managing orders.   Marca Ancona 08/05/2023

## 2023-08-05 NOTE — Progress Notes (Addendum)
PCP:  Madelin Headings, MD Cardiology: Dr. Izora Ribas HF Cardiology: Dr. Shirlee Latch  78 y.o. with history of CAD, paroxysmal atrial fibrillation, and HFpEF was referred by Dr. Izora Ribas for evaluation of CHF.  Patient had BMS to RCA in 2004, last coronary angiogram in 11/18 showed patent RCA stent with occluded acute marginal branch.  Patient has had 3 atrial fibrillation ablations and has been on amiodarone in the past.  Currently, she is on Tikosyn and is maintaining NSR.  She has tachy-brady syndrome with Abbott PPM.  She has been on home oxygen since 2020 with chronic hypoxemic respiratory failure.  Possible hypersensitivity pneumonitis by imaging. Follows with Dr. Marchelle Gearing, has not been on steroids or treatment for ILD.  She has OSA and uses CPAP at night.  She has a history of HFpEF, last echo showed EF 60-65% with normal RV in 6/23. RHCs done in 7/23 and 10/23 both showed primarily pulmonary venous hypertension, filling pressures significantly elevated in 7/23 but better-controlled in 10/23.    Patient is currently on torsemide 60 mg daily with metolazone for prn use.  She has not used metolazone recently.  Weight generally has been trending down.  She is short of breath walking "fast" or walking up stairs.  She generally walks slowly.  She is able to go to the grocery store and bring bags in the house, but she is tired and short of breath after finishing.  She short of breath after she finishes her shower and gets dressed, has to rest briefly.  No chest pain.  Rare lightheadedness. No palpitations. She is in NSR today. She has had a URI recently with coughing and congestion, no fever.   ECG (personally reviewed): NSR, 1st degree AVB, QTc 468 msec  Labs (9/24): LDL 42 Labs (11/24): pro-BNP 853, K 4.5, creatinine 1.48  PMH: 1. Type 2 diabetes 2. CAD: BMS to RCA in 2004.  - LHC (11/18): Patent RCA stent, CTO acute marginal branch 3. Atrial fibrillation: Paroxysmal.  S/p AF ablation x 3 in  8/19, 7/20, 1/22.  Prior amiodarone use.  4. OSA 5. Tachy-brady syndrome: Abbott dual chamber PPM.  6. CKD stage 3 7. Chronic hypoxemic respiratory failure: On home oxygen since 2020.  Suspect due to ILD, possible hypersensitivity pneumonitis.  - CCP antibody positive.  - HRCT chest (4/24): Scarring from prior infection versus ILD, possible hypersensitivity pneumonitis with upper lobe predominance.  - Has seen Dr. Marchelle Gearing, not on steroids or Ofev.  8. Cirrhosis by imaging.  9. HFpEF: Echo (6/23) with EF 60-65%, normal RV.  - RHC (7/23):mean RA 14, PA 63/24 mean 45, mean PCWP 25, CI 3.5, PVR 2.6.  - RHC (10/23): mean RA 4, PA 50/24 mean 35, mean PCWP 16, CI 2.99, PVR 2.97 WU 10. Type 2 diabetes  Social History   Socioeconomic History   Marital status: Married    Spouse name: Financial risk analyst   Number of children: 0   Years of education: HS   Highest education level: High school graduate  Occupational History   Occupation: retired    Comment: previously worked [employer]  Tobacco Use   Smoking status: Former    Current packs/day: 0.00    Average packs/day: 1 pack/day for 5.0 years (5.0 ttl pk-yrs)    Types: Cigarettes    Start date: 05/11/1978    Quit date: 07/03/1982    Years since quitting: 41.1   Smokeless tobacco: Never  Vaping Use   Vaping status: Never Used  Substance and Sexual  Activity   Alcohol use: Yes   Drug use: No   Sexual activity: Not Currently  Other Topics Concern   Not on file  Social History Narrative   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   Married    Originally from [place] of two, high school education   Former smoker 1979-1984 1ppd   Hunting dogs 7    Retired from [employer]   [id number]   [employer] 2    Social Drivers of Corporate investment banker Strain: Low Risk  (01/17/2023)   Overall Financial Resource Strain (CARDIA)    Difficulty of Paying Living Expenses: Not hard at all  Food Insecurity: No Food  Insecurity (01/17/2023)   Hunger Vital Sign    Worried About Running Out of Food in the Last Year: Never true    Ran Out of Food in the Last Year: Never true  Transportation Needs: No Transportation Needs (01/17/2023)   PRAPARE - Administrator, Civil Service (Medical): No    Lack of Transportation (Non-Medical): No  Physical Activity: Insufficiently Active (01/17/2023)   Exercise Vital Sign    Days of Exercise per Week: 3 days    Minutes of Exercise per Session: 20 min  Stress: No Stress Concern Present (01/17/2023)   [employer] of Occupational Health - Occupational Stress Questionnaire    Feeling of Stress : Not at all  Social Connections: Socially Integrated (01/17/2023)   Social Connection and Isolation Panel [NHANES]    Frequency of Communication with Friends and Family: More than three times a week    Frequency of Social Gatherings with Friends and Family: More than three times a week    Attends Religious Services: More than 4 times per year    Active Member of [employer] or Organizations: Yes    Attends Engineer, structural: More than 4 times per year    Marital Status: Married  Catering manager Violence: Not At Risk (01/17/2023)   Humiliation, Afraid, Rape, and Kick questionnaire    Fear of Current or Ex-Partner: No    Emotionally Abused: No    Physically Abused: No    Sexually Abused: No   Family History  Problem Relation Age of Onset   Suicidality Father        suicide death pt was 3 yrs, 1950   Arrhythmia Mother    Hypertension Mother    Diabetes Mother    Dementia Mother    Heart attack Brother    Heart disease Paternal Aunt    Prostate cancer Maternal Grandfather    Diabetes Paternal Grandfather        fathers side of the family   Colon cancer Maternal Aunt    ROS: All systems reviewed and negative except as per HPI.   Current Outpatient Medications  Medication Sig Dispense Refill   apixaban (ELIQUIS) 5 MG TABS tablet Take 1 tablet (5 mg  total) by mouth 2 (two) times daily. 180 tablet 1   atorvastatin (LIPITOR) 40 MG tablet Take 1 tablet (40 mg total) by mouth daily. 90 tablet 3   BD PEN NEEDLE NANO 2ND GEN 32G X 4 MM MISC daily.     Continuous Glucose Receiver (DEXCOM G7 RECEIVER) DEVI Use as directed while on insulin 1 each 3   Continuous Glucose Sensor (DEXCOM G7 SENSOR) MISC Apply 1 sensor to skin as directed to monitor blood sugars. Replace with new sensor every 10 days. 3 each 11  diltiazem (CARDIZEM LA) 360 MG 24 hr tablet Take 1 tablet (360 mg total) by mouth daily. 90 tablet 3   dofetilide (TIKOSYN) 250 MCG capsule Take 1 capsule (250 mcg total) by mouth 2 (two) times daily with breakfast and at bedtime. 180 capsule 3   doxycycline (VIBRAMYCIN) 100 MG capsule Take 1 capsule (100 mg total) by mouth 2 (two) times daily for 7 days. 14 capsule 0   DULoxetine (CYMBALTA) 60 MG capsule Take 1 capsule (60 mg total) by mouth daily. 90 capsule 3   empagliflozin (JARDIANCE) 25 MG TABS tablet Take 1 tablet (25 mg total) by mouth daily before breakfast. 90 tablet 0   ezetimibe (ZETIA) 10 MG tablet Take 1 tablet (10 mg total) by mouth daily. 90 tablet 3   fluticasone (FLONASE) 50 MCG/ACT nasal spray Place 1 spray into both nostrils daily as needed for allergies.     insulin degludec (TRESIBA FLEXTOUCH) 100 UNIT/ML FlexTouch Pen Inject 28 units into the skin daily as directed 9 mL 3   ipratropium (ATROVENT) 0.06 % nasal spray 1-2 puffs each nostril up to 4 times daily 15 mL 12   loratadine (CLARITIN) 10 MG tablet TAKE 1 TABLET BY MOUTH EVERY DAY 90 tablet 0   metFORMIN (GLUCOPHAGE) 1000 MG tablet Take 1 tablet (1,000 mg total) by mouth 2 (two) times daily. 180 tablet 3   metolazone (ZAROXOLYN) 2.5 MG tablet TAKE 1 TABLET BY MOUTH EVERY 4  DAYS AS NEEDED FOR SWELLING  PLEASE KEEP UPCOMING APPOINTMENT FURTHER REFILLS 25 tablet 2   metoprolol tartrate (LOPRESSOR) 50 MG tablet Take 1 tablet (50 mg total) by mouth every morning AND 2 tablets  (100 mg total) every evening. 270 tablet 3   nitroGLYCERIN (NITROSTAT) 0.4 MG SL tablet Place 1 tablet (0.4 mg total) under the tongue every 5 (five) minutes x 3 doses as needed for chest pain. 25 tablet 3   NON FORMULARY CPAP at bedtime     nystatin-triamcinolone (MYCOLOG II) cream Apply 1 application topically 2 (two) times daily as needed. 30 g 1   omeprazole (PRILOSEC) 20 MG capsule Take 1 capsule (20 mg total) by mouth 2 (two) times daily before breakfast and at bedtime. 60 capsule 3   ondansetron (ZOFRAN-ODT) 4 MG disintegrating tablet Take 1 tablet (4 mg total) by mouth every 8 (eight) hours as needed for nausea or vomiting. 20 tablet 1   Polyethyl Glycol-Propyl Glycol (SYSTANE) 0.4-0.3 % SOLN Place 1 drop into both eyes daily as needed (dry eyes).     potassium chloride SA (KLOR-CON M) 20 MEQ tablet Take 1 tablet (20 mEq total) by mouth daily. 90 tablet 3   spironolactone (ALDACTONE) 25 MG tablet Take 0.5 tablets (12.5 mg total) by mouth daily. 45 tablet 1   tirzepatide (MOUNJARO) 15 MG/0.5ML Pen Inject 15 mg into the skin once a week. 2 mL 11   torsemide (DEMADEX) 20 MG tablet Take 3 tablets (60 mg total) by mouth daily before breakfast 270 tablet 3   valACYclovir (VALTREX) 1000 MG tablet TAKE 2 TABLETS BY MOUTH EVERY 12 HOURS AS NEEDED 34 tablet 2   Current Facility-Administered Medications  Medication Dose Route Frequency Provider Last Rate Last Admin   sodium chloride flush (NS) 0.9 % injection 3 mL  3 mL Intravenous Q12H Gaston Islam., NP       BP 110/64   Pulse (!) 59   Wt 88.5 kg (195 lb)   SpO2 95% Comment: 4 l n/c  BMI 31.00  kg/m  General: NAD Neck: No JVD, no thyromegaly or thyroid nodule.  Lungs: Upper lobe predominant crackles. . CV: Nondisplaced PMI.  Heart regular S1/S2, no S3/S4, 2/6 SEM RUSB.  No peripheral edema.  No carotid bruit.  Normal pedal pulses.  Abdomen: Soft, nontender, no hepatosplenomegaly, no distention.  Skin: Intact without lesions or rashes.   Neurologic: Alert and oriented x 3.  Psych: Normal affect. Extremities: No clubbing or cyanosis.  HEENT: Normal.   Assessment/Plan: 1. Chronic diastolic CHF: Last echo in 6/23 showed EF 60-65%, normal RV. RHCs in 7/23 and 10/23 showed primarily pulmonary venous hypertension.  NYHA class III symptoms, but think her dyspnea is likely multifactorial => CHF, hypersensitivity pneumonitis/ILD, deconditioning. She does not look significantly volume overloaded on exam today.  - I think it would be reasonable to repeat RHC to reassess filling pressures and PA pressure.  If there appears to be a component of pulmonary arterial hypertension, may be worthwhile treating for pulmonary hypertension related to ILD with Tyvaso.  However, I suspect that her PH is primarily pulmonary venous hypertension not amenable to pulmonary vasodilators.  We discussed risks/benefits of RHC and she agrees to procedure.  - I am going to arrange for repeat echo.  If morphology looks concerning, would workup for cardiac amyloidosis (PYP scan, myeloma studies).  - Continue current torsemide 60 mg daily, check BMET/BNP today.  2. Chronic hypoxemic respiratory failure: She has been on home oxygen since 2020.  Suspect hypersensitivity pneumonitis based on HRCT chest in 4/24.  She follows with Dr. Marchelle Gearing for pulmonary.  3. OSA: Continue CPAP.  4. Atrial fibrillation: Paroxysmal.  Has had 3 AF ablations.  Currently on Tikosyn.  - Continue Tikosyn, QTc ok on ECG today.  - Continue apixaban.  5. CAD: No chest pain.  - Continue atorvastatin, check lipids today.  - On apixaban so no ASA.  6. Tachy-brady syndrome: Has Abbott PPM.  7. URI: Persistent URI symptoms with cough and congestion.  - I will give her 7 days worth of doxycycline.   Followup in 6 wks after studies.   I spent 61 minutes reviewing all the old records records, interviewing/examining patient, and managing orders.   Marca Ancona 08/05/2023

## 2023-08-05 NOTE — Addendum Note (Signed)
Encounter addended by: Laurey Morale, MD on: 08/05/2023 10:54 PM  Actions taken: Clinical Note Signed

## 2023-08-14 ENCOUNTER — Other Ambulatory Visit (HOSPITAL_COMMUNITY): Payer: Medicare Other

## 2023-08-15 ENCOUNTER — Other Ambulatory Visit: Payer: Self-pay | Admitting: *Deleted

## 2023-08-15 DIAGNOSIS — Z006 Encounter for examination for normal comparison and control in clinical research program: Secondary | ICD-10-CM

## 2023-08-15 NOTE — Progress Notes (Signed)
Order for echo for level study

## 2023-08-16 ENCOUNTER — Other Ambulatory Visit: Payer: Self-pay | Admitting: Nurse Practitioner

## 2023-08-16 ENCOUNTER — Other Ambulatory Visit: Payer: Self-pay | Admitting: Internal Medicine

## 2023-08-16 ENCOUNTER — Other Ambulatory Visit: Payer: Self-pay | Admitting: Cardiovascular Disease

## 2023-08-16 ENCOUNTER — Other Ambulatory Visit (HOSPITAL_COMMUNITY): Payer: Self-pay

## 2023-08-16 ENCOUNTER — Other Ambulatory Visit (HOSPITAL_COMMUNITY): Payer: Self-pay | Admitting: Cardiology

## 2023-08-16 ENCOUNTER — Other Ambulatory Visit: Payer: Self-pay | Admitting: Family

## 2023-08-16 ENCOUNTER — Telehealth: Payer: Self-pay | Admitting: Internal Medicine

## 2023-08-16 DIAGNOSIS — E1165 Type 2 diabetes mellitus with hyperglycemia: Secondary | ICD-10-CM

## 2023-08-16 DIAGNOSIS — I480 Paroxysmal atrial fibrillation: Secondary | ICD-10-CM

## 2023-08-16 MED ORDER — METFORMIN HCL 1000 MG PO TABS
1000.0000 mg | ORAL_TABLET | Freq: Two times a day (BID) | ORAL | 3 refills | Status: AC
  Filled 2023-08-16: qty 180, 90d supply, fill #0
  Filled 2023-11-20: qty 180, 90d supply, fill #1
  Filled 2024-02-13 (×2): qty 180, 90d supply, fill #2
  Filled 2024-05-13: qty 180, 90d supply, fill #3

## 2023-08-16 MED ORDER — SPIRONOLACTONE 25 MG PO TABS
12.5000 mg | ORAL_TABLET | Freq: Every day | ORAL | 2 refills | Status: DC
  Filled 2023-08-16: qty 45, 90d supply, fill #0
  Filled 2023-11-24: qty 45, 90d supply, fill #1
  Filled 2024-02-20: qty 45, 90d supply, fill #2

## 2023-08-16 MED ORDER — ATORVASTATIN CALCIUM 40 MG PO TABS
40.0000 mg | ORAL_TABLET | Freq: Every day | ORAL | 3 refills | Status: AC
  Filled 2023-08-16: qty 90, 90d supply, fill #0
  Filled 2023-11-20: qty 90, 90d supply, fill #1
  Filled 2024-02-13 (×2): qty 90, 90d supply, fill #2
  Filled 2024-05-13: qty 90, 90d supply, fill #3

## 2023-08-16 MED ORDER — APIXABAN 5 MG PO TABS
5.0000 mg | ORAL_TABLET | Freq: Two times a day (BID) | ORAL | 1 refills | Status: DC
  Filled 2023-08-16: qty 180, 90d supply, fill #0
  Filled 2023-11-16: qty 180, 90d supply, fill #1

## 2023-08-16 NOTE — Telephone Encounter (Signed)
Copied from CRM [id number]. Topic: Clinical - Medication Refill >> Aug 16, 2023  3:01 PM Gurney Maxin H wrote: Most Recent Primary Care Visit:  Provider: Vickii Chafe  Department: [facility]  Visit Type: NURSE VISIT  Date: 07/13/2023  Medication: empagliflozin (JARDIANCE) 25 MG TABS tablet, insulin degludec (TRESIBA FLEXTOUCH) 100 UNIT/ML FlexTouch Pen  Has the patient contacted their pharmacy? Yes, Pharmacy calling for verbal (Agent: If no, request that the patient contact the pharmacy for the refill. If patient does not wish to contact the pharmacy document the reason why and proceed with request.) (Agent: If yes, when and what did the pharmacy advise?)  Is this the correct pharmacy for this prescription? Yes If no, delete pharmacy and type the correct one.  This is the patient's preferred pharmacy:  [facility] [address] [place] [address] Phone: [phone] Fax: [phone]   Has the prescription been filled recently? No  Is the patient out of the medication? Yes  Has the patient been seen for an appointment in the last year OR does the patient have an upcoming appointment? Yes  Can we respond through MyChart? No  Agent: Please be advised that Rx refills may take up to 3 business days. We ask that you follow-up with your pharmacy.

## 2023-08-16 NOTE — Telephone Encounter (Signed)
Pt last saw Dr Izora Ribas on 05/14/23, last labs 08/03/23 Creat 1.25, age 78, weight 88.5kg, based on specified criteria pt is on appropriate dosage of Eliquis 5mg  BID for afib.  Will refill rx.

## 2023-08-16 NOTE — Telephone Encounter (Signed)
Copied from CRM [id number]. Topic: Clinical - Medication Refill >> Aug 16, 2023  4:47 PM Louie Casa B wrote: Most Recent Primary Care Visit:  Provider: Vickii Chafe  Department: [facility]  Visit Type: NURSE VISIT  Date: 07/13/2023  Medication: insulin degludec (TRESIBA FLEXTOUCH) 100 UNIT/ML FlexTo  Has the patient contacted their pharmacy? Yes (Agent: If no, request that the patient contact the pharmacy for the refill. If patient does not wish to contact the pharmacy document the reason why and proceed with request.) (Agent: If yes, when and what did the pharmacy advise?)  Is this the correct pharmacy for this prescription? Yes If no, delete pharmacy and type the correct one.  This is the patient's preferred pharmacy:  [facility] [address] [place] [address] Phone: [phone] Fax: [phone]   Has the prescription been filled recently? Yes  Is the patient out of the medication? Yes  Has the patient been seen for an appointment in the last year OR does the patient have an upcoming appointment? Yes  Can we respond through MyChart? Yes  Agent: Please be advised that Rx refills may take up to 3 business days. We ask that you follow-up with your pharmacy.

## 2023-08-17 ENCOUNTER — Ambulatory Visit (HOSPITAL_COMMUNITY)
Admission: RE | Admit: 2023-08-17 | Discharge: 2023-08-17 | Disposition: A | Discharge status: Home / Self Care | Payer: Medicare Other | Source: Ambulatory Visit | Attending: Internal Medicine | Admitting: Internal Medicine

## 2023-08-17 ENCOUNTER — Encounter (HOSPITAL_COMMUNITY): Payer: Self-pay

## 2023-08-17 ENCOUNTER — Other Ambulatory Visit (HOSPITAL_COMMUNITY): Payer: Self-pay

## 2023-08-17 VITALS — BP 110/47 | HR 57 | Temp 97.2°F | Resp 16 | Ht 66.0 in | Wt 192.5 lb

## 2023-08-17 DIAGNOSIS — Z006 Encounter for examination for normal comparison and control in clinical research program: Secondary | ICD-10-CM

## 2023-08-17 LAB — ECHOCARDIOGRAM COMPLETE
Height: 66 in
Weight: 3080 [oz_av]

## 2023-08-17 MED ORDER — EMPAGLIFLOZIN 25 MG PO TABS
25.0000 mg | ORAL_TABLET | Freq: Every day | ORAL | 0 refills | Status: DC
  Filled 2023-08-17 – 2023-09-25 (×2): qty 90, 90d supply, fill #0

## 2023-08-17 MED ORDER — TRESIBA FLEXTOUCH 100 UNIT/ML ~~LOC~~ SOPN
28.0000 [IU] | PEN_INJECTOR | Freq: Every day | SUBCUTANEOUS | 3 refills | Status: DC
  Filled 2023-08-17: qty 9, 32d supply, fill #0

## 2023-08-17 MED ORDER — TRESIBA FLEXTOUCH 100 UNIT/ML ~~LOC~~ SOPN
28.0000 [IU] | PEN_INJECTOR | Freq: Every day | SUBCUTANEOUS | 3 refills | Status: DC
  Filled 2023-08-17: qty 9, 32d supply, fill #0
  Filled 2023-09-24: qty 9, 32d supply, fill #1
  Filled 2023-11-12: qty 9, 32d supply, fill #2
  Filled 2024-01-08: qty 9, 32d supply, fill #3

## 2023-08-17 NOTE — Telephone Encounter (Signed)
Rx sent

## 2023-08-17 NOTE — Research (Signed)
Level Informed Consent   Subject Name: [name]  Subject met inclusion and exclusion criteria.  The informed consent form, study requirements and expectations were reviewed with the subject and questions and concerns were addressed prior to the signing of the consent form.  The subject verbalized understanding of the trial requirements.  The subject agreed to participate in the LEVEL trial and signed the informed consent at 1125 on 08/17/2023.  The informed consent was obtained prior to performance of any protocol-specific procedures for the subject.  A copy of the signed informed consent was given to the subject and a copy was placed in the subject's medical record.   Mercer Pod D    Level Screening Visit  VS: [x]  Blood work: 1134 Echo 08/17/2023  RHC 08/21/2023: RA mean 4 RV 34/2 PA 34/10, mean 19 PCWP mean 7  Oxygen saturations: PA 72% AO 97%  Cardiac Output (Fick) 5.39  Cardiac Index (Fick) 2.75 PVR 2.2 WU    EKG: [x]    6 Minute Walk Distance Worksheet Subject ID Date: 14/Feb/2025  Visit (check one): [x] Screening Visit [] Day 1 Clinic Visit [] Week 4 Clinic Visit [] Week 8 Clinic Visit[] Week 12 Clinic Visit or early termination visit if applicable                    [] Unscheduled Visit  OLE Visit (check one): [] Week 12 [] Week 16 [] Week 20 [] Week 24 [] Week 36 [] Week 48 [] Week 72 [] Week 96 [] Week 104 [] Unscheduled Visit  1. Did the subject rest in a chair for at least 10 minutes prior to the test? [x] Yes [] No 2. Was supplemental oxygen used during the test? [x] Yes [] No If Yes, please specify:  Type of oxygen tank: __portable___  Oxygen flow rate : _4____( L) How was oxygen transported? [] Pushed by patient [x] Pulled by patient [] Worn in backpack or waist pack [] Transported by site staff (not recommended) 3. Please verify no walking aid was used during the test: [] Yes [x] No 4. If this is the screening , did the subject complete the practice  walk? [x] Yes [] No [] N/A  Vitals Pre-test (after 5 min seated rest [x] Yes [] No): BP _112/52______ HR _57_ SpO2 __97__ Post-test (after 5 min seated rest [x] Yes [] No): BP _100/65___ HR _58_ SpO2 _96__ If SBP >180 and/or DBP >100 and/or resting HR >120bpm contact PI prior to completing . Start Time: _1222____ Laps/distance Demetrio Lapping for full or half lap. LAPS 1 2 3 4 5 6 7 8 9 10   FULL X X X X X X X     HALF X X X X X X X X    # of laps over 10:_0___ Total laps completed: _7_____ Lance Bosch of a partial lap (m): __9.14 m____ Total distance calculation: Distance of 1 full lap _30.48__ (m) x __7__number of full laps completed = _213.36_+ distance of partial lap _9.14_ (m) =  __222.5___m (total distance) End Time: _1230______  5.Were there any interruptions during the test (i.e., rest stops)? [x] Yes [] No If yes, describe: Start Time of Interruption: __1226_____ End Time of Interruption: _1227______ 6. Did the test have to be terminated early? [] Yes [x] No If yes, describe: 7. Were there any abnormalities during the test? [] Yes [x] No If yes, please specify: 8. Were there other symptoms at the end of the test? [x] Yes [] No If other, please specify: short of breath

## 2023-08-17 NOTE — Addendum Note (Signed)
Addended byVickii Chafe on: 08/17/2023 10:08 AM   Modules accepted: Orders

## 2023-08-20 ENCOUNTER — Telehealth (HOSPITAL_COMMUNITY): Payer: Self-pay

## 2023-08-20 NOTE — Telephone Encounter (Signed)
Spoke to patient aware of time and place for procedure for tomorrow. Aware of nothing to eat or drink after midnight  tonight . No insulin in the morning. Aware of holding Eliquis today and tomorrow. Has transportation to and from procedure.Holding diuretics am of procedure

## 2023-08-21 ENCOUNTER — Encounter (HOSPITAL_COMMUNITY): Payer: Self-pay | Admitting: Cardiology

## 2023-08-21 ENCOUNTER — Encounter (HOSPITAL_COMMUNITY)
Admission: RE | Disposition: A | Discharge status: Home / Self Care | Payer: Self-pay | Source: Home / Self Care | Attending: Cardiology

## 2023-08-21 ENCOUNTER — Other Ambulatory Visit: Payer: Self-pay

## 2023-08-21 ENCOUNTER — Ambulatory Visit (HOSPITAL_COMMUNITY)
Admission: RE | Admit: 2023-08-21 | Discharge: 2023-08-21 | Disposition: A | Discharge status: Home / Self Care | Payer: Medicare Other | Attending: Cardiology | Admitting: Cardiology

## 2023-08-21 DIAGNOSIS — I272 Pulmonary hypertension, unspecified: Secondary | ICD-10-CM | POA: Insufficient documentation

## 2023-08-21 DIAGNOSIS — G4733 Obstructive sleep apnea (adult) (pediatric): Secondary | ICD-10-CM | POA: Insufficient documentation

## 2023-08-21 DIAGNOSIS — Z9981 Dependence on supplemental oxygen: Secondary | ICD-10-CM | POA: Insufficient documentation

## 2023-08-21 DIAGNOSIS — J9611 Chronic respiratory failure with hypoxia: Secondary | ICD-10-CM | POA: Insufficient documentation

## 2023-08-21 DIAGNOSIS — Z955 Presence of coronary angioplasty implant and graft: Secondary | ICD-10-CM | POA: Insufficient documentation

## 2023-08-21 DIAGNOSIS — I2510 Atherosclerotic heart disease of native coronary artery without angina pectoris: Secondary | ICD-10-CM | POA: Insufficient documentation

## 2023-08-21 DIAGNOSIS — N183 Chronic kidney disease, stage 3 unspecified: Secondary | ICD-10-CM | POA: Insufficient documentation

## 2023-08-21 DIAGNOSIS — E1122 Type 2 diabetes mellitus with diabetic chronic kidney disease: Secondary | ICD-10-CM | POA: Insufficient documentation

## 2023-08-21 DIAGNOSIS — Z7985 Long-term (current) use of injectable non-insulin antidiabetic drugs: Secondary | ICD-10-CM | POA: Insufficient documentation

## 2023-08-21 DIAGNOSIS — I480 Paroxysmal atrial fibrillation: Secondary | ICD-10-CM | POA: Insufficient documentation

## 2023-08-21 DIAGNOSIS — Z87891 Personal history of nicotine dependence: Secondary | ICD-10-CM | POA: Insufficient documentation

## 2023-08-21 DIAGNOSIS — J069 Acute upper respiratory infection, unspecified: Secondary | ICD-10-CM | POA: Insufficient documentation

## 2023-08-21 DIAGNOSIS — I5032 Chronic diastolic (congestive) heart failure: Secondary | ICD-10-CM | POA: Insufficient documentation

## 2023-08-21 DIAGNOSIS — Z950 Presence of cardiac pacemaker: Secondary | ICD-10-CM | POA: Insufficient documentation

## 2023-08-21 DIAGNOSIS — Z7901 Long term (current) use of anticoagulants: Secondary | ICD-10-CM | POA: Insufficient documentation

## 2023-08-21 DIAGNOSIS — Z79899 Other long term (current) drug therapy: Secondary | ICD-10-CM | POA: Insufficient documentation

## 2023-08-21 DIAGNOSIS — I495 Sick sinus syndrome: Secondary | ICD-10-CM | POA: Insufficient documentation

## 2023-08-21 HISTORY — PX: RIGHT HEART CATH: CATH118263

## 2023-08-21 LAB — GLUCOSE, CAPILLARY: Glucose-Capillary: 84 mg/dL (ref 70–99)

## 2023-08-21 LAB — POCT I-STAT 7, (LYTES, BLD GAS, ICA,H+H)
Acid-Base Excess: 10 mmol/L — ABNORMAL HIGH (ref 0.0–2.0)
Bicarbonate: 35.9 mmol/L — ABNORMAL HIGH (ref 20.0–28.0)
Calcium, Ion: 1.19 mmol/L (ref 1.15–1.40)
HCT: 41 % (ref 36.0–46.0)
Hemoglobin: 13.9 g/dL (ref 12.0–15.0)
O2 Saturation: 71 %
Potassium: 3.3 mmol/L — ABNORMAL LOW (ref 3.5–5.1)
Sodium: 136 mmol/L (ref 135–145)
TCO2: 38 mmol/L — ABNORMAL HIGH (ref 22–32)
pCO2 arterial: 54 mm[Hg] — ABNORMAL HIGH (ref 32–48)
pH, Arterial: 7.431 (ref 7.35–7.45)
pO2, Arterial: 37 mm[Hg] — CL (ref 83–108)

## 2023-08-21 LAB — CBC
HCT: 42.9 % (ref 36.0–46.0)
Hemoglobin: 14.2 g/dL (ref 12.0–15.0)
MCH: 28.7 pg (ref 26.0–34.0)
MCHC: 33.1 g/dL (ref 30.0–36.0)
MCV: 86.8 fL (ref 80.0–100.0)
Platelets: 331 10*3/uL (ref 150–400)
RBC: 4.94 MIL/uL (ref 3.87–5.11)
RDW: 14.5 % (ref 11.5–15.5)
WBC: 12.7 10*3/uL — ABNORMAL HIGH (ref 4.0–10.5)
nRBC: 0 % (ref 0.0–0.2)

## 2023-08-21 SURGERY — RIGHT HEART CATH
Anesthesia: LOCAL

## 2023-08-21 MED ORDER — LABETALOL HCL 5 MG/ML IV SOLN: 10.0000 mg | INTRAVENOUS | Status: DC | PRN

## 2023-08-21 MED ORDER — LIDOCAINE HCL (PF) 1 % IJ SOLN
INTRAMUSCULAR | Status: AC
  Filled 2023-08-21: qty 30

## 2023-08-21 MED ORDER — ONDANSETRON HCL 4 MG/2ML IJ SOLN: 4.0000 mg | Freq: Four times a day (QID) | INTRAMUSCULAR | Status: DC | PRN

## 2023-08-21 MED ORDER — SODIUM CHLORIDE 0.9 % IV SOLN: 250.0000 mL | INTRAVENOUS | Status: DC | PRN

## 2023-08-21 MED ORDER — SODIUM CHLORIDE 0.9% FLUSH: 3.0000 mL | INTRAVENOUS | Status: DC | PRN

## 2023-08-21 MED ORDER — HYDRALAZINE HCL 20 MG/ML IJ SOLN: 10.0000 mg | INTRAMUSCULAR | Status: DC | PRN

## 2023-08-21 MED ORDER — ACETAMINOPHEN 325 MG PO TABS: 650.0000 mg | ORAL_TABLET | ORAL | Status: DC | PRN

## 2023-08-21 MED ORDER — LIDOCAINE HCL (PF) 1 % IJ SOLN
INTRAMUSCULAR | Status: DC | PRN
  Administered 2023-08-21: 2 mL

## 2023-08-21 MED ORDER — HEPARIN (PORCINE) IN NACL 1000-0.9 UT/500ML-% IV SOLN
INTRAVENOUS | Status: DC | PRN
  Administered 2023-08-21: 500 mL

## 2023-08-21 MED ORDER — SODIUM CHLORIDE 0.9 % IV SOLN
INTRAVENOUS | Status: DC
Start: 2023-08-21 — End: 2023-08-21

## 2023-08-21 MED ORDER — SODIUM CHLORIDE 0.9% FLUSH
3.0000 mL | Freq: Two times a day (BID) | INTRAVENOUS | Status: DC
Start: 2023-08-21 — End: 2023-08-21

## 2023-08-21 SURGICAL SUPPLY — 5 items
CATH BALLN WEDGE 5F 110CM (CATHETERS) IMPLANT
PACK CARDIAC CATHETERIZATION (CUSTOM PROCEDURE TRAY) ×1 IMPLANT
SHEATH GLIDE SLENDER 4/5FR (SHEATH) IMPLANT
STOPCOCK MORSE 400PSI 3WAY (MISCELLANEOUS) IMPLANT
TUBING ART PRESS 72 MALE/FEM (TUBING) IMPLANT

## 2023-08-21 NOTE — Interval H&P Note (Signed)
History and Physical Interval Note:  08/21/2023 7:54 AM  [name]  has presented today for surgery, with the diagnosis of hp.  The various methods of treatment have been discussed with the patient and family. After consideration of risks, benefits and other options for treatment, the patient has consented to  Procedure(s): RIGHT HEART CATH (N/A) as a surgical intervention.  The patient's history has been reviewed, patient examined, no change in status, stable for surgery.  I have reviewed the patient's chart and labs.  Questions were answered to the patient's satisfaction.     Dorri Ozturk [employer]

## 2023-08-21 NOTE — Discharge Instructions (Signed)

## 2023-08-22 ENCOUNTER — Other Ambulatory Visit (HOSPITAL_COMMUNITY): Payer: Self-pay

## 2023-08-22 LAB — POCT I-STAT 7, (LYTES, BLD GAS, ICA,H+H)
Acid-Base Excess: 10 mmol/L — ABNORMAL HIGH (ref 0.0–2.0)
Bicarbonate: 35.8 mmol/L — ABNORMAL HIGH (ref 20.0–28.0)
Calcium, Ion: 1.18 mmol/L (ref 1.15–1.40)
HCT: 40 % (ref 36.0–46.0)
Hemoglobin: 13.6 g/dL (ref 12.0–15.0)
O2 Saturation: 73 %
Potassium: 3.3 mmol/L — ABNORMAL LOW (ref 3.5–5.1)
Sodium: 136 mmol/L (ref 135–145)
TCO2: 37 mmol/L — ABNORMAL HIGH (ref 22–32)
pCO2 arterial: 54 mm[Hg] — ABNORMAL HIGH (ref 32–48)
pH, Arterial: 7.43 (ref 7.35–7.45)
pO2, Arterial: 38 mm[Hg] — CL (ref 83–108)

## 2023-08-24 NOTE — Research (Signed)
Due to RHC results she is a screen fail for the study   Patient aware and thanked her for screening for the study.   Mercer Pod :) RN BSN  Clinical Research Nurse  Be strong and take heart, all you who hope in the [place]. ~ Psalm 31:24

## 2023-08-24 NOTE — Research (Addendum)
 Are there any labs that are clinically significant?  Yes []  OR No[x]   Is the patient eligible to continue enrollment in the study after screening visit?  Yes []   OR No[x]   Please FORWARD back to me with any changes or follow up!

## 2023-08-27 ENCOUNTER — Other Ambulatory Visit (HOSPITAL_COMMUNITY): Payer: Self-pay

## 2023-08-28 ENCOUNTER — Other Ambulatory Visit: Payer: Self-pay

## 2023-08-28 ENCOUNTER — Other Ambulatory Visit (HOSPITAL_COMMUNITY): Payer: Self-pay

## 2023-08-28 ENCOUNTER — Telehealth: Payer: Self-pay

## 2023-08-28 NOTE — Telephone Encounter (Signed)
 Copied from CRM [id number]. Topic: Referral - Request for Referral >> Aug 28, 2023  8:26 AM Martinique E wrote: Did the patient discuss referral with their provider in the last year? Yes (If No - schedule appointment) (If Yes - send message)  Appointment offered? Yes  Type of order/referral and detailed reason for visit: Patient has been dealing with hemorrhoids for the past couple of years and it has gotten to the point where if she is constipated, she cannot have a bowel movement.   Preference of office, provider, location: Romie Levee, MD (colorectal surgeon)  If referral order, have you been seen by this specialty before? No (If Yes, this issue or another issue? When? Where?  Can we respond through MyChart? Yes

## 2023-08-31 ENCOUNTER — Other Ambulatory Visit (HOSPITAL_COMMUNITY): Payer: Self-pay

## 2023-09-12 ENCOUNTER — Other Ambulatory Visit (HOSPITAL_COMMUNITY): Payer: Self-pay

## 2023-09-20 DIAGNOSIS — M47816 Spondylosis without myelopathy or radiculopathy, lumbar region: Secondary | ICD-10-CM | POA: Diagnosis not present

## 2023-09-24 ENCOUNTER — Other Ambulatory Visit (HOSPITAL_COMMUNITY): Payer: Self-pay

## 2023-09-25 ENCOUNTER — Other Ambulatory Visit: Payer: Self-pay

## 2023-09-26 ENCOUNTER — Encounter (HOSPITAL_COMMUNITY): Payer: Self-pay | Admitting: Cardiology

## 2023-09-26 ENCOUNTER — Ambulatory Visit (HOSPITAL_COMMUNITY)
Admission: RE | Admit: 2023-09-26 | Discharge: 2023-09-26 | Disposition: A | Discharge status: Home / Self Care | Payer: Medicare Other | Source: Ambulatory Visit | Attending: Cardiology | Admitting: Cardiology

## 2023-09-26 ENCOUNTER — Other Ambulatory Visit (HOSPITAL_COMMUNITY): Payer: Self-pay

## 2023-09-26 VITALS — BP 100/60 | HR 51 | Wt 197.6 lb

## 2023-09-26 DIAGNOSIS — I440 Atrioventricular block, first degree: Secondary | ICD-10-CM | POA: Diagnosis not present

## 2023-09-26 DIAGNOSIS — Z955 Presence of coronary angioplasty implant and graft: Secondary | ICD-10-CM | POA: Diagnosis not present

## 2023-09-26 DIAGNOSIS — I5032 Chronic diastolic (congestive) heart failure: Secondary | ICD-10-CM | POA: Insufficient documentation

## 2023-09-26 DIAGNOSIS — Z79899 Other long term (current) drug therapy: Secondary | ICD-10-CM | POA: Diagnosis not present

## 2023-09-26 DIAGNOSIS — Z9981 Dependence on supplemental oxygen: Secondary | ICD-10-CM | POA: Diagnosis not present

## 2023-09-26 DIAGNOSIS — I480 Paroxysmal atrial fibrillation: Secondary | ICD-10-CM | POA: Diagnosis not present

## 2023-09-26 DIAGNOSIS — I2510 Atherosclerotic heart disease of native coronary artery without angina pectoris: Secondary | ICD-10-CM | POA: Diagnosis not present

## 2023-09-26 DIAGNOSIS — I4891 Unspecified atrial fibrillation: Secondary | ICD-10-CM | POA: Insufficient documentation

## 2023-09-26 DIAGNOSIS — J9611 Chronic respiratory failure with hypoxia: Secondary | ICD-10-CM | POA: Diagnosis not present

## 2023-09-26 DIAGNOSIS — G4733 Obstructive sleep apnea (adult) (pediatric): Secondary | ICD-10-CM | POA: Diagnosis not present

## 2023-09-26 DIAGNOSIS — Z7901 Long term (current) use of anticoagulants: Secondary | ICD-10-CM | POA: Diagnosis not present

## 2023-09-26 DIAGNOSIS — Z950 Presence of cardiac pacemaker: Secondary | ICD-10-CM | POA: Insufficient documentation

## 2023-09-26 DIAGNOSIS — I495 Sick sinus syndrome: Secondary | ICD-10-CM | POA: Insufficient documentation

## 2023-09-26 DIAGNOSIS — I272 Pulmonary hypertension, unspecified: Secondary | ICD-10-CM | POA: Diagnosis not present

## 2023-09-26 LAB — BASIC METABOLIC PANEL
Anion gap: 14 (ref 5–15)
BUN: 40 mg/dL — ABNORMAL HIGH (ref 8–23)
CO2: 31 mmol/L (ref 22–32)
Calcium: 10 mg/dL (ref 8.9–10.3)
Chloride: 90 mmol/L — ABNORMAL LOW (ref 98–111)
Creatinine, Ser: 1.57 mg/dL — ABNORMAL HIGH (ref 0.44–1.00)
GFR, Estimated: 34 mL/min — ABNORMAL LOW (ref >60)
Glucose, Bld: 137 mg/dL — ABNORMAL HIGH (ref 70–99)
Potassium: 4.4 mmol/L (ref 3.5–5.1)
Sodium: 135 mmol/L (ref 135–145)

## 2023-09-26 LAB — BRAIN NATRIURETIC PEPTIDE: B Natriuretic Peptide: 71.8 pg/mL (ref 0.0–100.0)

## 2023-09-26 MED ORDER — METOLAZONE 2.5 MG PO TABS
2.5000 mg | ORAL_TABLET | ORAL | 2 refills | Status: AC
  Filled 2023-09-26: qty 25, 25d supply, fill #0

## 2023-09-26 NOTE — Patient Instructions (Signed)
 There has been no changes to your medications.  Labs done today, your results will be available in MyChart, we will contact you for abnormal readings.  Your physician recommends that you schedule a follow-up appointment in: 6 months.  If you have any questions or concerns before your next appointment please send [place] a message through [place] or call our office at [phone].    TO LEAVE A MESSAGE FOR THE NURSE SELECT OPTION 2, PLEASE LEAVE A MESSAGE INCLUDING: YOUR NAME DATE OF BIRTH CALL BACK NUMBER REASON FOR CALL**this is important as we prioritize the call backs  YOU WILL RECEIVE A CALL BACK THE SAME DAY AS LONG AS YOU CALL BEFORE 4:00 PM  At the [facility], you and your health needs are our priority. As part of our continuing mission to provide you with exceptional heart care, we have created designated Provider Care Teams. These Care Teams include your primary Cardiologist (physician) and Advanced Practice Providers (APPs- Physician Assistants and Nurse Practitioners) who all work together to provide you with the care you need, when you need it.   You may see any of the following providers on your designated Care Team at your next follow up: Dr Arvilla Meres Dr Marca Ancona Dr. Dorthula Nettles Dr. Clearnce Hasten Amy Filbert Schilder, NP Robbie Lis, [place] [facility] [place], [place] Brynda Peon, NP [place] Lee, NP Clarisa Kindred, NP Karle Plumber, PharmD Enos Fling, PharmD   Please be sure to bring in all your medications bottles to every appointment.    Thank you for choosing [facility]

## 2023-09-26 NOTE — Progress Notes (Signed)
 PCP:  Madelin Headings, MD Cardiology: Dr. Izora Ribas HF Cardiology: Dr. Shirlee Latch  Chief Complaint: CHF  78 y.o. with history of CAD, paroxysmal atrial fibrillation, and HFpEF was referred by Dr. Izora Ribas for evaluation of CHF.  Patient had BMS to RCA in 2004, last coronary angiogram in 11/18 showed patent RCA stent with occluded acute marginal branch.  Patient has had 3 atrial fibrillation ablations and has been on amiodarone in the past.  Currently, she is on Tikosyn and is maintaining NSR.  She has tachy-brady syndrome with Abbott PPM.  She has been on home oxygen since 2020 with chronic hypoxemic respiratory failure.  Possible hypersensitivity pneumonitis by imaging. Follows with Dr. Marchelle Gearing, has not been on steroids or treatment for ILD.  She has OSA and uses CPAP at night.  She has a history of HFpEF, last echo showed EF 60-65% with normal RV in 6/23. RHCs done in 7/23 and 10/23 both showed primarily pulmonary venous hypertension, filling pressures significantly elevated in 7/23 but better-controlled in 10/23.    Echo in 2/25 showed EF 55-60%, normal RV, PA systolic pressure 37 mmHg, IVC normal.  RHC in 2/25 showed PA 34/10 with mean PCWP 7, CI 2.75, PVR 2.2 WU.   Patient returns for followup of CHF.  Main complaint today is low back pain, planning steroid injection.  Weight is stable. She is wearing 4L home oxygen. No dyspnea walking on flat ground and able to get around the grocery store.  No palpitations.  She takes occasional metolazone.   ECG (personally reviewed): NSR, 1st degree AVB, QTc 468 msec  Labs (9/24): LDL 42 Labs (11/24): pro-BNP 853, K 4.5, creatinine 1.48 Labs (1/25): LDL 45, BNP 88, K 4.9, creatinine 1.25  PMH: 1. Type 2 diabetes 2. CAD: BMS to RCA in 2004.  - LHC (11/18): Patent RCA stent, CTO acute marginal branch 3. Atrial fibrillation: Paroxysmal.  S/p AF ablation x 3 in 8/19, 7/20, 1/22.  Prior amiodarone use.  4. OSA 5. Tachy-brady syndrome: Abbott dual  chamber PPM.  6. CKD stage 3 7. Chronic hypoxemic respiratory failure: On home oxygen since 2020.  Suspect due to ILD, possible hypersensitivity pneumonitis.  - CCP antibody positive.  - HRCT chest (4/24): Scarring from prior infection versus ILD, possible hypersensitivity pneumonitis with upper lobe predominance.  - Has seen Dr. Marchelle Gearing, not on steroids or Ofev.  8. Cirrhosis by imaging.  9. HFpEF: Echo (6/23) with EF 60-65%, normal RV.  - RHC (7/23):mean RA 14, PA 63/24 mean 45, mean PCWP 25, CI 3.5, PVR 2.6.  - RHC (10/23): mean RA 4, PA 50/24 mean 35, mean PCWP 16, CI 2.99, PVR 2.97 WU - Echo (2/25): EF 55-60%, normal RV, PASP 37 mmHg, IVC normal. - RHC (2/25): mean RA 4, PA 34/10 mean 19, mean PCWP 7, CI 2.75, PVR 2.2 WU 10. Type 2 diabetes  Social History   Socioeconomic History   Marital status: Married    Spouse name: Financial risk analyst   Number of children: 0   Years of education: HS   Highest education level: High school graduate  Occupational History   Occupation: retired    Comment: previously worked [employer]  Tobacco Use   Smoking status: Former    Current packs/day: 0.00    Average packs/day: 1 pack/day for 5.0 years (5.0 ttl pk-yrs)    Types: Cigarettes    Start date: 05/11/1978    Quit date: 07/03/1982    Years since quitting: 41.2   Smokeless tobacco: Never  Vaping Use   Vaping status: Never Used  Substance and Sexual Activity   Alcohol use: Yes   Drug use: No   Sexual activity: Not Currently  Other Topics Concern   Not on file  Social History Narrative   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   Married    Originally from [place] of two, high school education   Former smoker 1979-1984 1ppd   Hunting dogs 7    Retired from [employer]   [id number]   [employer] 2    Social Drivers of Corporate investment banker Strain: Low Risk  (01/17/2023)   Overall Financial Resource Strain (CARDIA)    Difficulty of Paying Living  Expenses: Not hard at all  Food Insecurity: No Food Insecurity (01/17/2023)   Hunger Vital Sign    Worried About Running Out of Food in the Last Year: Never true    Ran Out of Food in the Last Year: Never true  Transportation Needs: No Transportation Needs (01/17/2023)   PRAPARE - Administrator, Civil Service (Medical): No    Lack of Transportation (Non-Medical): No  Physical Activity: Insufficiently Active (01/17/2023)   Exercise Vital Sign    Days of Exercise per Week: 3 days    Minutes of Exercise per Session: 20 min  Stress: No Stress Concern Present (01/17/2023)   [employer] of Occupational Health - Occupational Stress Questionnaire    Feeling of Stress : Not at all  Social Connections: Socially Integrated (01/17/2023)   Social Connection and Isolation Panel [NHANES]    Frequency of Communication with Friends and Family: More than three times a week    Frequency of Social Gatherings with Friends and Family: More than three times a week    Attends Religious Services: More than 4 times per year    Active Member of [employer] or Organizations: Yes    Attends Engineer, structural: More than 4 times per year    Marital Status: Married  Catering manager Violence: Not At Risk (01/17/2023)   Humiliation, Afraid, Rape, and Kick questionnaire    Fear of Current or Ex-Partner: No    Emotionally Abused: No    Physically Abused: No    Sexually Abused: No   Family History  Problem Relation Age of Onset   Suicidality Father        suicide death pt was 3 yrs, 1950   Arrhythmia Mother    Hypertension Mother    Diabetes Mother    Dementia Mother    Heart attack Brother    Heart disease Paternal Aunt    Prostate cancer Maternal Grandfather    Diabetes Paternal Grandfather        fathers side of the family   Colon cancer Maternal Aunt    ROS: All systems reviewed and negative except as per HPI.   Current Outpatient Medications  Medication Sig Dispense Refill    apixaban (ELIQUIS) 5 MG TABS tablet Take 1 tablet (5 mg total) by mouth 2 (two) times daily. 180 tablet 1   atorvastatin (LIPITOR) 40 MG tablet Take 1 tablet (40 mg total) by mouth daily. 90 tablet 3   BD PEN NEEDLE NANO 2ND GEN 32G X 4 MM MISC daily.     Continuous Glucose Receiver (DEXCOM G7 RECEIVER) DEVI Use as directed while on insulin 1 each 3   Continuous Glucose Sensor (DEXCOM G7 SENSOR) MISC Apply 1 sensor to skin as directed to monitor  blood sugars. Replace with new sensor every 10 days. 3 each 11   diltiazem (CARDIZEM LA) 360 MG 24 hr tablet Take 1 tablet (360 mg total) by mouth daily. 90 tablet 3   dofetilide (TIKOSYN) 250 MCG capsule Take 1 capsule (250 mcg total) by mouth 2 (two) times daily with breakfast and at bedtime. 180 capsule 3   DULoxetine (CYMBALTA) 60 MG capsule Take 1 capsule (60 mg total) by mouth daily. 90 capsule 3   empagliflozin (JARDIANCE) 25 MG TABS tablet Take 1 tablet (25 mg total) by mouth daily before breakfast. 90 tablet 0   ezetimibe (ZETIA) 10 MG tablet Take 1 tablet (10 mg total) by mouth daily. 90 tablet 3   fluticasone (FLONASE) 50 MCG/ACT nasal spray Place 1 spray into both nostrils daily as needed for allergies.     insulin degludec (TRESIBA FLEXTOUCH) 100 UNIT/ML FlexTouch Pen Inject 28 Units into the skin daily as directed 9 mL 3   ipratropium (ATROVENT) 0.06 % nasal spray Place 1 spray into both nostrils as needed for rhinitis.     loratadine (CLARITIN) 10 MG tablet TAKE 1 TABLET BY MOUTH EVERY DAY 90 tablet 0   metFORMIN (GLUCOPHAGE) 1000 MG tablet Take 1 tablet (1,000 mg total) by mouth 2 (two) times daily. 180 tablet 3   metoprolol tartrate (LOPRESSOR) 50 MG tablet Take 1 tablet (50 mg total) by mouth every morning AND 2 tablets (100 mg total) every evening. 270 tablet 3   nitroGLYCERIN (NITROSTAT) 0.4 MG SL tablet Place 1 tablet (0.4 mg total) under the tongue every 5 (five) minutes x 3 doses as needed for chest pain. 25 tablet 3   NON FORMULARY  CPAP at bedtime     nystatin-triamcinolone (MYCOLOG II) cream Apply 1 application topically 2 (two) times daily as needed. 30 g 1   omeprazole (PRILOSEC) 20 MG capsule Take 1 capsule (20 mg total) by mouth 2 (two) times daily before breakfast and at bedtime. 60 capsule 3   ondansetron (ZOFRAN-ODT) 4 MG disintegrating tablet Take 1 tablet (4 mg total) by mouth every 8 (eight) hours as needed for nausea or vomiting. 20 tablet 1   Polyethyl Glycol-Propyl Glycol (SYSTANE) 0.4-0.3 % SOLN Place 1 drop into both eyes daily as needed (dry eyes).     potassium chloride SA (KLOR-CON M) 20 MEQ tablet Take 1 tablet (20 mEq total) by mouth daily. 90 tablet 3   spironolactone (ALDACTONE) 25 MG tablet Take 0.5 tablets (12.5 mg total) by mouth daily. 45 tablet 2   tirzepatide (MOUNJARO) 15 MG/0.5ML Pen Inject 15 mg into the skin once a week. 2 mL 11   torsemide (DEMADEX) 20 MG tablet Take 3 tablets (60 mg total) by mouth daily before breakfast 270 tablet 3   valACYclovir (VALTREX) 1000 MG tablet TAKE 2 TABLETS BY MOUTH EVERY 12 HOURS AS NEEDED 34 tablet 2   metolazone (ZAROXOLYN) 2.5 MG tablet Take 1 tablet (2.5 mg total) by mouth as directed. Weekly as needed for weight gain of 3 lb in 24 hours, 5 lb in a week or leg swelling 25 tablet 2   Current Facility-Administered Medications  Medication Dose Route Frequency Provider Last Rate Last Admin   sodium chloride flush (NS) 0.9 % injection 3 mL  3 mL Intravenous Q12H Gaston Islam., NP       BP 100/60   Pulse (!) 51   Wt 89.6 kg (197 lb 9.6 oz)   SpO2 94%   BMI 31.89 kg/m  General: NAD Neck: No JVD, no thyromegaly or thyroid nodule.  Lungs: Mild crackles at bases.  CV: Nondisplaced PMI.  Heart regular S1/S2, no S3/S4, no murmur.  No peripheral edema.  No carotid bruit.  Normal pedal pulses.  Abdomen: Soft, nontender, no hepatosplenomegaly, no distention.  Skin: Intact without lesions or rashes.  Neurologic: Alert and oriented x 3.  Psych: Normal  affect. Extremities: No clubbing or cyanosis.  HEENT: Normal.   Assessment/Plan: 1. Chronic diastolic CHF: Last echo in 2/25 showed EF 55-60%, normal RV. RHCs in 7/23 and 10/23 showed primarily pulmonary venous hypertension, RHC in 2/25 with good diuresis showed normal PA pressure and normal filling pressures.  NYHA class II symptoms.  I suspect that her dyspnea is likely multifactorial => CHF, hypersensitivity pneumonitis/ILD, deconditioning. She does not look significantly volume overloaded on exam today.  - Continue torsemide 60 mg daily.  BMET/BNP today.  2. Chronic hypoxemic respiratory failure: She has been on home oxygen since 2020.  Suspect hypersensitivity pneumonitis based on HRCT chest in 4/24.  She follows with Dr. Marchelle Gearing for pulmonary.  3. OSA: Continue CPAP.  4. Atrial fibrillation: Paroxysmal.  Has had 3 AF ablations.  Currently on Tikosyn.  - Continue Tikosyn, QTc ok on ECG today.  - Continue apixaban.  5. CAD: No chest pain.  - Continue atorvastatin, good lipids in 1/25.   - On apixaban so no ASA.  6. Tachy-brady syndrome: Has Abbott PPM.   Followup in 6 months with APP.   I spent 31 minutes reviewing records, interviewing/examining patient, and managing orders.   Marca Ancona 09/26/2023

## 2023-09-28 ENCOUNTER — Telehealth (HOSPITAL_COMMUNITY): Payer: Self-pay

## 2023-09-28 DIAGNOSIS — I5032 Chronic diastolic (congestive) heart failure: Secondary | ICD-10-CM

## 2023-09-28 NOTE — Telephone Encounter (Signed)
 Patient's lab orders placed. In addition pt will avoid taking her metolazone medication. Pt aware, agreeable, and verbalized understanding.

## 2023-09-28 NOTE — Telephone Encounter (Signed)
-----   Message from Marca Ancona sent at 09/26/2023 10:32 PM EDT ----- Avoid metolazone use, repeat BMET in 10 days to make sure renal function remains stable.

## 2023-10-01 DIAGNOSIS — M47816 Spondylosis without myelopathy or radiculopathy, lumbar region: Secondary | ICD-10-CM | POA: Diagnosis not present

## 2023-10-01 DIAGNOSIS — I4891 Unspecified atrial fibrillation: Secondary | ICD-10-CM | POA: Diagnosis not present

## 2023-10-11 ENCOUNTER — Ambulatory Visit (HOSPITAL_COMMUNITY)
Admission: RE | Admit: 2023-10-11 | Discharge: 2023-10-11 | Disposition: A | Discharge status: Home / Self Care | Source: Ambulatory Visit | Attending: Cardiology | Admitting: Cardiology

## 2023-10-11 DIAGNOSIS — I5032 Chronic diastolic (congestive) heart failure: Secondary | ICD-10-CM | POA: Insufficient documentation

## 2023-10-11 LAB — BASIC METABOLIC PANEL WITH GFR
Anion gap: 10 (ref 5–15)
BUN: 33 mg/dL — ABNORMAL HIGH (ref 8–23)
CO2: 26 mmol/L (ref 22–32)
Calcium: 9.6 mg/dL (ref 8.9–10.3)
Chloride: 98 mmol/L (ref 98–111)
Creatinine, Ser: 1.25 mg/dL — ABNORMAL HIGH (ref 0.44–1.00)
GFR, Estimated: 44 mL/min — ABNORMAL LOW (ref >60)
Glucose, Bld: 131 mg/dL — ABNORMAL HIGH (ref 70–99)
Potassium: 5 mmol/L (ref 3.5–5.1)
Sodium: 134 mmol/L — ABNORMAL LOW (ref 135–145)

## 2023-10-12 ENCOUNTER — Encounter (HOSPITAL_COMMUNITY): Payer: Self-pay

## 2023-10-15 ENCOUNTER — Telehealth (HOSPITAL_COMMUNITY): Payer: Self-pay

## 2023-10-15 NOTE — Telephone Encounter (Signed)
-----   Message from Peder Bourdon sent at 10/11/2023  9:23 PM EDT ----- Stop KCl.

## 2023-10-15 NOTE — Telephone Encounter (Signed)
 Patient's kcl medication has been discontinued and updated in pt's chart. Pt aware, agreeable, and verbalized understanding.

## 2023-10-26 ENCOUNTER — Other Ambulatory Visit (HOSPITAL_COMMUNITY): Payer: Self-pay

## 2023-10-29 ENCOUNTER — Other Ambulatory Visit (HOSPITAL_COMMUNITY): Payer: Self-pay

## 2023-11-01 DIAGNOSIS — M533 Sacrococcygeal disorders, not elsewhere classified: Secondary | ICD-10-CM | POA: Diagnosis not present

## 2023-11-19 DIAGNOSIS — M533 Sacrococcygeal disorders, not elsewhere classified: Secondary | ICD-10-CM | POA: Diagnosis not present

## 2023-11-22 ENCOUNTER — Other Ambulatory Visit (HOSPITAL_COMMUNITY): Payer: Self-pay

## 2023-11-27 ENCOUNTER — Other Ambulatory Visit: Payer: Self-pay | Admitting: Family

## 2023-11-27 DIAGNOSIS — E1165 Type 2 diabetes mellitus with hyperglycemia: Secondary | ICD-10-CM

## 2023-12-03 ENCOUNTER — Other Ambulatory Visit (HOSPITAL_COMMUNITY): Payer: Self-pay

## 2023-12-27 ENCOUNTER — Other Ambulatory Visit: Payer: Self-pay | Admitting: Internal Medicine

## 2023-12-27 ENCOUNTER — Other Ambulatory Visit (HOSPITAL_COMMUNITY): Payer: Self-pay

## 2023-12-27 DIAGNOSIS — E1165 Type 2 diabetes mellitus with hyperglycemia: Secondary | ICD-10-CM

## 2023-12-27 MED ORDER — EMPAGLIFLOZIN 25 MG PO TABS
25.0000 mg | ORAL_TABLET | Freq: Every day | ORAL | 0 refills | Status: DC
  Filled 2023-12-27: qty 90, 90d supply, fill #0

## 2023-12-31 ENCOUNTER — Other Ambulatory Visit (HOSPITAL_COMMUNITY): Payer: Self-pay

## 2023-12-31 ENCOUNTER — Encounter (HOSPITAL_BASED_OUTPATIENT_CLINIC_OR_DEPARTMENT_OTHER): Payer: Self-pay

## 2023-12-31 NOTE — Progress Notes (Unsigned)
 Cardiology Office Note    Date:  01/03/2024  ID:  [name], DOB 1945-10-03, MRN [id number] PCP:  Samantha Apolinar POUR, MD  Cardiologist:  Stanly DELENA Leavens, MD  Electrophysiologist:  Soyla Gladis Norton, MD   Chief Complaint: Follow up for CAD, PAF   History of Present Illness: .    [name] is a 78 y.o. female with visit-pertinent history of chronic diastolic CHF, CAD s/p BMS to the RCA in 2004, paroxysmal atrial fibrillation s/p A-fib ablation x 3 with multiple cardioversions, now on Tikosyn , tachybradycardia syndrome s/p PPM, OSA, obesity, type II DM, CKD stage IIIb.   In 2018 patient underwent RHC to evaluate dyspnea which showed RA mean 13, PA 76/29, PCWP mean 33, LVEDP 21, Fick CO/CI 8.47/3.87, PVR 3 [place].  Due to discrepancy in PCWP and LVEDP, CTA heart was recommended to rule out pulmonary vein stenosis given history of PV isolation.  Cardiac CTA with no evidence of pulmonary vein stenosis but did show atypical left pulmonary vein drainage into left atrium.  Patient was admitted in 12/2021 for acute hypoxic respiratory failure due to acute on chronic HFpEF and possible ILD WHO group 2/3 PAH.  She was diuresed with IV Lasix  and transition to p.o. torsemide .  Echo indicated EF 60 to 65%, RV was okay with no significant valve disease.  HRCT did show groundglass opacities and air trapping also noted on prior scans.  Pulmonary was consulted.  Some findings on imaging appeared suggestive chronic hypersensitivity pneumonitis.  Outpatient follow-up with pulmonology was recommended.  She was discharged on supplemental oxygen .  Patient was seen by Dr. Arnetha on 05/14/2023 to transition care from Dr. Hobart.  Patient reported that she was feeling very well.  She was referred to heart failure clinic.  Patient had a repeat RHC on 01/04/2022 which showed elevated filling pressures.  Torsemide  was increased to 60 mg daily. RHC 05/02/22 with RAP 4, PAP 50/24 with mean 35, PCWP 16, PA sat  74%, CO 6.4, CI 2.99, PVR 2.97 WU. Recommended for continued medical therapy with torsemide  and jardiance .   Today she presents for follow-up.  She reports that she has been doing well overall.  She denies any significant chest pain, pressure or tightness.  She does report some intermittent episodes of brain fog and feeling slightly fatigued.  She reports that her breathing has remained stable, denies any significant changes, she is currently on 4 L of oxygen  nasal cannula.  She denies any increased lower extremity edema, orthopnea or PND.  She denies any dizziness, lightheadedness, presyncope or syncope.  ROS: .   Today she denies chest pain, lower extremity edema, palpitations, melena, hematuria, hemoptysis, diaphoresis, weakness, presyncope, syncope, orthopnea, and PND.  All other systems are reviewed and otherwise negative. Studies Reviewed: SABRA   EKG:  EKG is ordered today, personally reviewed, demonstrating  EKG Interpretation Date/Time:  Wednesday January 02 2024 13:46:34 EDT Ventricular Rate:  58 PR Interval:  392 QRS Duration:  122 QT Interval:  448 QTC Calculation: 439 R Axis:   68  Text Interpretation: Sinus bradycardia with sinus arrhythmia with 1st degree A-V block with frequent ventricular-paced complexes Non-specific intra-ventricular conduction delay When compared with ECG of 26-Sep-2023 08:46, Electronic ventricular pacemaker has replaced Sinus rhythm Confirmed by Artemus Romanoff [phone]) on 01/03/2024 7:28:02 PM   CV Studies: Cardiac studies reviewed are outlined and summarized above. Otherwise please see EMR for full report. Cardiac Studies & Procedures   ______________________________________________________________________________________________ CARDIAC CATHETERIZATION  CARDIAC CATHETERIZATION 08/21/2023  Conclusion 1. Normal filling pressures. 2. Upper normal PA pressure. 3. Normal cardiac output.   CARDIAC CATHETERIZATION  CARDIAC CATHETERIZATION  05/02/2022  Conclusion 1. Borderline elevated PCWP, normal RA pressure. 2. Moderate primarily pulmonary venous hypertension with PVR 2.97 WU.  Continue current torsemide  and Jardiance .     ECHOCARDIOGRAM  ECHOCARDIOGRAM COMPLETE 08/17/2023  Narrative ECHOCARDIOGRAM REPORT    Patient Name:   [name] Date of Exam: 08/17/2023 Medical Rec #:  [id number]    Height:       66.5 in Accession #:    [id number]   Weight:       195.0 lb Date of Birth:  [birth year]     BSA:          1.990 m Patient Age:    78 years     BP:           103/62 mmHg Patient Gender: F            HR:           57 bpm. Exam Location:  Outpatient  Procedure: 2D Echo, Cardiac Doppler and Color Doppler (Both Spectral and Color Flow Doppler were utilized during procedure).  Indications:    Research study patient  History:        Patient has prior history of Echocardiogram examinations, most recent 12/10/2021.  Sonographer:    Ozell Free Referring Phys: [id number] DANIEL R BENSIMHON  IMPRESSIONS   1. Left ventricular ejection fraction, by estimation, is 55 to 60%. The left ventricle has normal function. The left ventricle has no regional wall motion abnormalities. Left ventricular diastolic parameters are indeterminate. 2. Right ventricular systolic function is normal. The right ventricular size is normal. There is mildly elevated pulmonary artery systolic pressure. The estimated right ventricular systolic pressure is 36.6 mmHg. 3. The mitral valve is abnormal. Mild mitral valve regurgitation. No evidence of mitral stenosis. 4. The aortic valve is normal in structure. Aortic valve regurgitation is not visualized. No aortic stenosis is present. 5. The inferior vena cava is normal in size with greater than 50% respiratory variability, suggesting right atrial pressure of 3 mmHg.  FINDINGS Left Ventricle: Left ventricular ejection fraction, by estimation, is 55 to 60%. The left ventricle has normal function. The left  ventricle has no regional wall motion abnormalities. Strain imaging was not performed. The left ventricular internal cavity size was normal in size. There is no left ventricular hypertrophy. Left ventricular diastolic parameters are indeterminate.  Right Ventricle: The right ventricular size is normal. No increase in right ventricular wall thickness. Right ventricular systolic function is normal. There is mildly elevated pulmonary artery systolic pressure. The tricuspid regurgitant velocity is 2.90 m/s, and with an assumed right atrial pressure of 3 mmHg, the estimated right ventricular systolic pressure is 36.6 mmHg.  Left Atrium: Left atrial size was normal in size.  Right Atrium: Right atrial size was normal in size.  Pericardium: There is no evidence of pericardial effusion.  Mitral Valve: The mitral valve is abnormal. There is mild calcification of the mitral valve leaflet(s). Mild mitral annular calcification. Mild mitral valve regurgitation. No evidence of mitral valve stenosis.  Tricuspid Valve: The tricuspid valve is normal in structure. Tricuspid valve regurgitation is trivial. No evidence of tricuspid stenosis.  Aortic Valve: The aortic valve is normal in structure. Aortic valve regurgitation is not visualized. No aortic stenosis is present.  Pulmonic Valve: The pulmonic valve was normal in structure. Pulmonic valve regurgitation is not visualized. No evidence of pulmonic  stenosis.  Aorta: The aortic root is normal in size and structure.  Venous: The inferior vena cava is normal in size with greater than 50% respiratory variability, suggesting right atrial pressure of 3 mmHg.  IAS/Shunts: No atrial level shunt detected by color flow Doppler.  Additional Comments: 3D imaging was not performed. A device lead is visualized.   TRICUSPID VALVE TR Peak grad:   33.6 mmHg TR Vmax:        290.00 cm/s  Toribio Fuel MD Electronically signed by Toribio Fuel MD Signature  Date/Time: 08/17/2023/4:14:57 PM    Final   TEE  ECHO TEE 09/29/2016  Narrative *[place]* *[facility]* [address] [place], [place] [address] [phone]  ------------------------------------------------------------------- Transesophageal Echocardiography  Patient:    Tomasina, Keasling MR #:       [id number] Study Date: 09/29/2016 Gender:     F Age:        70 Height: Weight: BSA: Pt. Status: Room:       [facility]  PERFORMING   Oneil Parchment, M.D. ADMITTING    Will Inocencio, MD ATTENDING    Soyla Inocencio, MD ORDERING     Soyla Inocencio, MD REFERRING    Soyla Inocencio, MD SONOGRAPHER  [facility]  cc:  ------------------------------------------------------------------- LV EF: 55% -   60%  ------------------------------------------------------------------- Indications:      Atrial fibrillation - 427.31.  ------------------------------------------------------------------- History:   PMH:   Transient ischemic attack.  Risk factors: obesity. Hypertension. Dyslipidemia.  ------------------------------------------------------------------- Study Conclusions  - Left ventricle: The cavity size was normal. Wall thickness was normal. Systolic function was normal. The estimated ejection fraction was in the range of 55% to 60%. - Aortic valve: No evidence of vegetation. - Mitral valve: No evidence of vegetation. There was mild regurgitation. - Left atrium: No evidence of thrombus in the atrial cavity or appendage. No evidence of thrombus in the appendage. no mass. - Right atrium: No evidence of thrombus in the atrial cavity or appendage. - Atrial septum: No defect or patent foramen ovale was identified. Echo contrast study showed no right-to-left atrial level shunt, following an increase in RA pressure induced by provocative maneuvers. - Tricuspid valve: No evidence of vegetation. - Line: A venous pacing wire was visualized in the right atrial cavity  and right ventricular cavity, with its tip at the RV apex. - Superior vena cava: The study excluded a thrombus.  ------------------------------------------------------------------- Study data:   Study status:  Routine.  Consent:  The risks, benefits, and alternatives to the procedure were explained to the patient and informed consent was obtained.  Procedure:  Initial setup. The patient was brought to the laboratory. Surface ECG leads were monitored. Sedation. Conscious sedation was administered. Transesophageal echocardiography. Topical anesthesia was obtained using viscous lidocaine . An adult multiplane transesophageal probe was inserted by the attending cardiologist. Image quality was adequate.  Study completion:  The patient tolerated the procedure well. There were no complications.  Administered medications: Fentanyl , 50mcg, IV.  Midazolam , 4mg , IV.          Diagnostic transesophageal echocardiography.  2D and color Doppler. Birthdate:  Patient birthdate: Mar 27, 1946.  Age:  Patient is 78 yr old.  Sex:  Gender: female.  Blood pressure:     144/81  Patient status:  Inpatient.  Study date:  Study date: 09/29/2016. Study time: 09:19 AM.  Location:  Endoscopy.  -------------------------------------------------------------------  ------------------------------------------------------------------- Left ventricle:  The cavity size was normal. Wall thickness was normal. Systolic function was normal. The estimated ejection fraction  was in the range of 55% to 60%.  ------------------------------------------------------------------- Aortic valve:   Structurally normal valve.   Cusp separation was normal.  No evidence of vegetation.  Doppler:  There was no regurgitation.  ------------------------------------------------------------------- Aorta:  The aorta was normal, not dilated, and non-diseased.  ------------------------------------------------------------------- Mitral valve:    Mildly thickened leaflets . Leaflet separation was normal.  No evidence of vegetation.  Doppler:  There was mild regurgitation.  ------------------------------------------------------------------- Left atrium:  Prominent ridge posterior to LA appendage (normal variant). The atrium was normal in size.  No evidence of thrombus in the atrial cavity or appendage.  No evidence of thrombus in the appendage. The appendage was of normal size. Emptying velocity was normal.  ------------------------------------------------------------------- Atrial septum:  No defect or patent foramen ovale was identified. Echo contrast study showed no right-to-left atrial level shunt, following an increase in RA pressure induced by provocative maneuvers.  ------------------------------------------------------------------- Right ventricle:  The cavity size was normal. Wall thickness was normal. Pacer wire or catheter noted in right ventricle. Systolic function was normal.  ------------------------------------------------------------------- Pulmonic valve:    Doppler:  There was trivial regurgitation.  ------------------------------------------------------------------- Tricuspid valve:   Structurally normal valve.   Leaflet separation was normal.  No evidence of vegetation.  Doppler:  There was mild regurgitation.  ------------------------------------------------------------------- Right atrium:  The atrium was normal in size. Pacer wire or catheter noted in right atrium.  No evidence of thrombus in the atrial cavity or appendage.  ------------------------------------------------------------------- Pericardium:  The pericardium was normal in appearance. There was no pericardial effusion.  ------------------------------------------------------------------- Systemic veins: Line: A venous pacing wire was visualized in the right atrial cavity and right ventricular cavity, with its tip at the RV  apex. Superior vena cava: The study excluded a thrombus.  ------------------------------------------------------------------- Post procedure conclusions Ascending Aorta:  - The aorta was normal, not dilated, and non-diseased.  ------------------------------------------------------------------- Prepared and Electronically Authenticated by  Oneil Parchment, M.D. 2018-03-30T10:35:04    CT SCANS  CT CORONARY MORPH W/CTA COR W/SCORE 07/04/2017  Addendum 07/05/2017  2:25 PM ADDENDUM REPORT: 07/05/2017 14:23  CLINICAL DATA:  78 year old female with h/o atrial fibrillation ablation now presenting with DOE.  EXAM: Cardiac CT/CTA  TECHNIQUE: The patient was scanned on a Siemens Somatom scanner.  COMPARISON:  Cardiac CTA from 04/09/2017.  FINDINGS: A 120 kV prospective scan was triggered in the descending thoracic aorta at 111 HU's. Gantry rotation speed was 280 msecs and collimation was .9 mm. No beta blockade and no NTG was given. The 3D data set was reconstructed in 5% intervals of the 60-80 % of the R-R cycle. Diastolic phases were analyzed on a dedicated work station using MPR, MIP and VRT modes. The patient received 80 cc of contrast.  There is normal pulmonary vein drainage into the left atrium (2 on the right and 2 on the left) with ostial measurements as follows:  RUPV:  21 x 17 mm (previously 21 x 19 mm)  RLPV:  18 x 14 mm (previously 20 x 14 mm)  LUPV:  22 x 18 mm (previously 19 x 17 mm)  LLPV:  19 x 18 mm (previously 19 x 16 mm)  The left atrial appendage is large with chicken wing morphology and one major lobe with ostial size 30 x 26 mm and length 46 mm. There is no thrombus in the left atrial appendage.  The esophagus runs to the left from the left atrial midline and is in the proximity to the LLPV.  Aorta:  Normal caliber.  No  dissection, minimal calcifications.  Aortic Valve:  Trileaflet.  Trivial calcifications.  Coronary Arteries: Normal coronary  origin. Right dominance. The study was performed without use of NTG and insufficient for plaque evaluation.  Dilated pulmonary artery measuring 32 mm suggestive of pulmonary hypertension.  IMPRESSION: 1. There is normal pulmonary vein drainage into the left atrium. There is no evidence for pulmonary vein stenosis that is defined as 50% narrowing of the lumen when compared to the prior study.  2. The left atrial appendage is large with chicken wing morphology and one major lobe with ostial size 30 x 26 mm and length 46 mm. There is no thrombus in the left atrial appendage.  3. The esophagus runs to the left from the left atrial midline and is in the proximity to the LLPV.  4. Dilated pulmonary artery measuring 32 mm suggestive of pulmonary hypertension.   Electronically Signed By: Leim Moose On: 07/05/2017 14:23  Narrative EXAM: OVER-READ INTERPRETATION  CT CHEST  The following report is an over-read performed by radiologist Dr. Toribio Cove [facility] on 07/04/2017. This over-read does not include interpretation of cardiac or coronary anatomy or pathology. The coronary calcium  score/coronary CTA interpretation by the cardiologist is attached.  COMPARISON:  Chest CT 04/09/2017.  FINDINGS: Aortic atherosclerosis. Within the visualized portions of the thorax there are no suspicious appearing pulmonary nodules or masses, there is no acute consolidative airspace disease, no pleural effusions, no pneumothorax and no lymphadenopathy. Visualized portions of the upper abdomen are unremarkable. There are no aggressive appearing lytic or blastic lesions noted in the visualized portions of the skeleton.  IMPRESSION: 1.  Aortic Atherosclerosis (ICD10-I70.0).  Electronically Signed: By: Toribio Aye M.D. On: 07/04/2017 10:49     ______________________________________________________________________________________________       Current Reported  Medications:.    Current Meds  Medication Sig   apixaban  (ELIQUIS ) 5 MG TABS tablet Take 1 tablet (5 mg total) by mouth 2 (two) times daily.   atorvastatin  (LIPITOR) 40 MG tablet Take 1 tablet (40 mg total) by mouth daily.   BD PEN NEEDLE NANO 2ND GEN 32G X 4 MM MISC daily.   Continuous Glucose Receiver (DEXCOM G7 RECEIVER) DEVI Use as directed while on insulin    Continuous Glucose Sensor (DEXCOM G7 SENSOR) MISC Apply 1 sensor to skin as directed to monitor blood sugars. Replace with new sensor every 10 days.   diltiazem  (CARDIZEM  LA) 360 MG 24 hr tablet Take 1 tablet (360 mg total) by mouth daily.   dofetilide  (TIKOSYN ) 250 MCG capsule Take 1 capsule (250 mcg total) by mouth 2 (two) times daily with breakfast and at bedtime.   DULoxetine  (CYMBALTA ) 60 MG capsule Take 1 capsule (60 mg total) by mouth daily.   empagliflozin  (JARDIANCE ) 25 MG TABS tablet Take 1 tablet (25 mg total) by mouth daily before breakfast.   ezetimibe  (ZETIA ) 10 MG tablet Take 1 tablet (10 mg total) by mouth daily.   fluticasone  (FLONASE ) 50 MCG/ACT nasal spray Place 1 spray into both nostrils daily as needed for allergies.   insulin  degludec (TRESIBA  FLEXTOUCH) 100 UNIT/ML FlexTouch Pen Inject 28 Units into the skin daily as directed   ipratropium (ATROVENT ) 0.06 % nasal spray Place 1 spray into both nostrils as needed for rhinitis.   loratadine  (CLARITIN ) 10 MG tablet TAKE 1 TABLET BY MOUTH EVERY DAY   metFORMIN  (GLUCOPHAGE ) 1000 MG tablet Take 1 tablet (1,000 mg total) by mouth 2 (two) times daily.   metolazone  (ZAROXOLYN ) 2.5 MG tablet Take  1 tablet (2.5 mg total) by mouth as directed. Weekly as needed for weight gain of 3 lb in 24 hours, 5 lb in a week or leg swelling   metoprolol  tartrate (LOPRESSOR ) 50 MG tablet Take 1 tablet (50 mg total) by mouth every morning AND 2 tablets (100 mg total) every evening.   nitroGLYCERIN  (NITROSTAT ) 0.4 MG SL tablet Place 1 tablet (0.4 mg total) under the tongue every 5 (five)  minutes x 3 doses as needed for chest pain.   NON FORMULARY CPAP at bedtime   nystatin -triamcinolone  (MYCOLOG II) cream Apply 1 application topically 2 (two) times daily as needed.   omeprazole  (PRILOSEC) 20 MG capsule Take 1 capsule (20 mg total) by mouth 2 (two) times daily before breakfast and at bedtime. (Patient taking differently: Take 20 mg by mouth 2 (two) times daily. Per patient taking 1 tablet in the morning and 1 tablet in the night.)   ondansetron  (ZOFRAN -ODT) 4 MG disintegrating tablet Take 1 tablet (4 mg total) by mouth every 8 (eight) hours as needed for nausea or vomiting.   Polyethyl Glycol-Propyl Glycol (SYSTANE) 0.4-0.3 % SOLN Place 1 drop into both eyes daily as needed (dry eyes).   spironolactone  (ALDACTONE ) 25 MG tablet Take 0.5 tablets (12.5 mg total) by mouth daily.   tirzepatide  (MOUNJARO ) 15 MG/0.5ML Pen Inject 15 mg into the skin once a week.   torsemide  (DEMADEX ) 20 MG tablet Take 3 tablets (60 mg total) by mouth daily before breakfast   valACYclovir  (VALTREX ) 1000 MG tablet TAKE 2 TABLETS BY MOUTH EVERY 12 HOURS AS NEEDED   Current Facility-Administered Medications for the 01/02/24 encounter (Office Visit) with Lamekia Nolden D, NP  Medication   sodium chloride  flush (NS) 0.9 % injection 3 mL   Physical Exam:    VS:  BP 124/64   Pulse (!) 58   Ht 5' 6.5 (1.689 m)   Wt 195 lb 6.4 oz (88.6 kg)   SpO2 93%   BMI 31.07 kg/m    Wt Readings from Last 3 Encounters:  01/02/24 195 lb 6.4 oz (88.6 kg)  09/26/23 197 lb 9.6 oz (89.6 kg)  08/21/23 189 lb 8 oz (86 kg)    GEN: Well nourished, well developed in no acute distress NECK: No JVD; No carotid bruits CARDIAC: RRR, no murmurs, rubs, gallops RESPIRATORY:  mild crackles at bases  ABDOMEN: Soft, non-tender, non-distended EXTREMITIES:  No edema; No acute deformity     Asessement and Plan:.    Chronic diastolic CHF: Echo in 2/25 showed EF 55-60%, normal RV, PA systolic pressure 37 mmHg, IVC normal. RHC in 2/25  showed PA 34/10 with mean PCWP 7, CI 2.75, PVR 2.2 WU.  Today she appears euvolemic and well compensated on exam.  She reports that her breathing is stable, denies any lower extremity edema, orthopnea or PND.  She remains on 4 L nasal cannula.  Continue Jardiance  25 mg daily, Lopressor  50 mg twice daily, spironolactone  12.5 mg daily and torsemide  60 mg daily.  She has metolazone  as needed for weight gain of 3 pounds in 24 hours or 5 pounds in a week or increased lower extremity edema.  She reports that she has not recently required this.  Pulmonary HTN/chronic respiratory failure with hypoxia: RHCs done in 7/23 and 10/23 both showed primarily pulmonary venous hypertension, filling pressures significantly elevated in 7/23 but better-controlled in 10/23.  Patient with mixed pulmonary hypertension with group 2 and group 3 related interstitial lung disease.  Patient reports that her  breathing has been stable, remains on 4 L nasal cannula.  Also follows with pulmonology.  Coronary artery disease: S/p BMS to the RCA in 2004.  LHC in 2018 with patent stent to the RCA, CTO of small acute marginal branch with collaterals, LAD and LCx patent.  Patient denies any significant chest pain, pressure or tightness.  Reports chronic and stable shortness of breath as noted above.  Denies any recent significant changes.  Continue Eliquis  5 mg twice daily, Lipitor 40 mg daily, diltiazem  360 mg daily, Tikosyn  250 mcg twice daily, Jardiance  25 mg daily, Zetia  10 mg daily, metoprolol  tartrate 50 mg twice daily, spironolactone  12.5 mg daily and torsemide  60 mg daily.  Paroxysmal atrial fibrillation/flutter: History of A-fib ablationx3 and a flutter ablation in 2022.  Patient denies any significant palpitations or feeling of irregular heartbeats.  She denies any bleeding problems on Eliquis .  EKG today indicates sinus rhythm with sinus arrhythmia and first-degree AV block with V paced complexes versus PVCs, noted to have some QRS  widening with nonspecific intraventricular conduction delay.  Encourage patient to have soon follow-up with the EP as she has not been seen in 2 years. Continue Tikosyn  250 mcg twice daily, metoprolol  tartrate 50 mg twice daily and diltiazem  360 mg daily. Continue Eliquis  5 mg twice daily. Check CBC, BMET and magnesium  level.   DM type II: Last hemoglobin A1c 6.10% on 02/28/2023.  Monitor managed per PCP.  Tachybradycardia syndrome: S/p [employer] Jude dual-chamber pacemaker.  On chart review patient has not been seen by EP or had a device interrogation in over 2 years.  EKG today with possible V paced complexes versus PVCs, reviewed with Daphne Barrack, PA with EP, QTc noted to be stable, recommended follow-up with EP.  OSA: Patient reports CPAP compliance.  HLD: Last lipid profile on 08/03/2023 indicated total cholesterol 111, HDL 28, triglycerides 190 and LDL 45.  Continue Lipitor.   Disposition: F/u with EP team at next available appointment. F/u with Dr. Santo in 3 months.   Signed, Cay Kath D Athol Bolds, NP

## 2024-01-02 ENCOUNTER — Encounter: Payer: Self-pay | Admitting: Cardiology

## 2024-01-02 ENCOUNTER — Ambulatory Visit: Attending: Cardiology | Admitting: Cardiology

## 2024-01-02 VITALS — BP 124/64 | HR 58 | Ht 66.5 in | Wt 195.4 lb

## 2024-01-02 DIAGNOSIS — E785 Hyperlipidemia, unspecified: Secondary | ICD-10-CM

## 2024-01-02 DIAGNOSIS — I2510 Atherosclerotic heart disease of native coronary artery without angina pectoris: Secondary | ICD-10-CM

## 2024-01-02 DIAGNOSIS — I5032 Chronic diastolic (congestive) heart failure: Secondary | ICD-10-CM

## 2024-01-02 DIAGNOSIS — I272 Pulmonary hypertension, unspecified: Secondary | ICD-10-CM | POA: Diagnosis not present

## 2024-01-02 DIAGNOSIS — G4733 Obstructive sleep apnea (adult) (pediatric): Secondary | ICD-10-CM | POA: Diagnosis not present

## 2024-01-02 DIAGNOSIS — Z79899 Other long term (current) drug therapy: Secondary | ICD-10-CM | POA: Diagnosis not present

## 2024-01-02 NOTE — Patient Instructions (Signed)
 Medication Instructions:  No changes *If you need a refill on your cardiac medications before your next appointment, please call your pharmacy*  Lab Work: Today we are going to draw a CBC, Bmet, and Mag level If you have labs (blood work) drawn today and your tests are completely normal, you will receive your results only by: MyChart Message (if you have MyChart) OR A paper copy in the mail If you have any lab test that is abnormal or we need to change your treatment, we will call you to review the results.  Testing/Procedures: No testing  Follow-Up: At [facility], you and your health needs are our priority.  As part of our continuing mission to provide you with exceptional heart care, our providers are all part of one team.  This team includes your primary Cardiologist (physician) and Advanced Practice Providers or APPs (Physician Assistants and Nurse Practitioners) who all work together to provide you with the care you need, when you need it.  Your next appointment:   3 month(s)  Provider:   Stanly DELENA Leavens, MD    Other appointments:  F/u with EP  We recommend signing up for the patient portal called MyChart.  Sign up information is provided on this After Visit Summary.  MyChart is used to connect with patients for Virtual Visits (Telemedicine).  Patients are able to view lab/test results, encounter notes, upcoming appointments, etc.  Non-urgent messages can be sent to your provider as well.   To learn more about what you can do with MyChart, go to [email or web address].

## 2024-01-03 ENCOUNTER — Encounter: Payer: Self-pay | Admitting: Cardiology

## 2024-01-03 ENCOUNTER — Ambulatory Visit: Payer: Self-pay | Admitting: Cardiology

## 2024-01-03 DIAGNOSIS — E875 Hyperkalemia: Secondary | ICD-10-CM | POA: Diagnosis not present

## 2024-01-03 LAB — BASIC METABOLIC PANEL WITH GFR
BUN/Creatinine Ratio: 20 (ref 12–28)
BUN/Creatinine Ratio: 18 (ref 12–28)
BUN: 25 mg/dL (ref 8–27)
BUN: 22 mg/dL (ref 8–27)
CO2: 22 mmol/L (ref 20–29)
CO2: 28 mmol/L (ref 20–29)
Calcium: 10.3 mg/dL (ref 8.7–10.3)
Calcium: 9.4 mg/dL (ref 8.7–10.3)
Chloride: 94 mmol/L — ABNORMAL LOW (ref 96–106)
Chloride: 96 mmol/L (ref 96–106)
Creatinine, Ser: 1.27 mg/dL — ABNORMAL HIGH (ref 0.57–1.00)
Creatinine, Ser: 1.21 mg/dL — ABNORMAL HIGH (ref 0.57–1.00)
Glucose: 110 mg/dL — ABNORMAL HIGH (ref 70–99)
Glucose: 116 mg/dL — ABNORMAL HIGH (ref 70–99)
Potassium: 5.9 mmol/L (ref 3.5–5.2)
Potassium: 4.4 mmol/L (ref 3.5–5.2)
Sodium: 136 mmol/L (ref 134–144)
Sodium: 139 mmol/L (ref 134–144)
eGFR: 44 mL/min/{1.73_m2} — ABNORMAL LOW (ref >59)
eGFR: 46 mL/min/{1.73_m2} — ABNORMAL LOW (ref >59)

## 2024-01-03 LAB — CBC
Hematocrit: 44.6 % (ref 34.0–46.6)
Hemoglobin: 13.9 g/dL (ref 11.1–15.9)
MCH: 28.4 pg (ref 26.6–33.0)
MCHC: 31.2 g/dL — ABNORMAL LOW (ref 31.5–35.7)
MCV: 91 fL (ref 79–97)
Platelets: 490 10*3/uL — ABNORMAL HIGH (ref 150–450)
RBC: 4.89 x10E6/uL (ref 3.77–5.28)
RDW: 13.7 % (ref 11.7–15.4)
WBC: 12.3 10*3/uL — ABNORMAL HIGH (ref 3.4–10.8)

## 2024-01-03 LAB — MAGNESIUM: Magnesium: 2.5 mg/dL — ABNORMAL HIGH (ref 1.6–2.3)

## 2024-01-03 MED ORDER — LOKELMA 10 G PO PACK: PACK | ORAL | Status: DC

## 2024-01-03 MED ORDER — LOKELMA 10 G PO PACK: 10.0000 g | PACK | Freq: Once | ORAL | Status: DC

## 2024-01-03 NOTE — Telephone Encounter (Signed)
-----   Message from Katlyn D West sent at 01/03/2024  8:07 AM EDT ----- Please call [name] and let her know that her potassium level is elevated. She needs to stop her spironolactone  and any supplements she is currently taking, including a potassium or magnesium   supplement. Her kidney function is normal, her hemoglobin and hematocrit are normal, her white count is minimally elevated but is close to prior levels. Her magnesium  level is elevated.   Plan:  - Have her go to [facility] for a stat BMET today - Stop any supplements (please confirm if she has been taking a potassium supplement, she has been taking an OTC magnesium  supplement, she should stop this)  - Stop spironolactone   ----- Message ----- From: Interface, [facility] Lab Results In Sent: 01/03/2024   2:35 AM EDT To: Katlyn D West, NP

## 2024-01-03 NOTE — Telephone Encounter (Signed)
 Received message via secure chat from [name] CMA requesting sample of Lokelma be given to patient if potassium still elevated Patient came by office, sample x 1 given to patient Advised patient she is not to take [facility] unless the office contacts and advises to do so  Patient verbalized understanding

## 2024-01-03 NOTE — Telephone Encounter (Signed)
 Called patient advised of below they verbalized understanding They are going there after they get dress ordered labs STAT

## 2024-01-03 NOTE — Telephone Encounter (Signed)
 Called patient advised of below they verbalized understanding.

## 2024-01-03 NOTE — Telephone Encounter (Signed)
-----   Message from Katlyn D West sent at 01/03/2024 12:55 PM EDT ----- Please call [name], her potassium is normal, she should not take Lokelma and can resume her medications as she was previously taking them. Would recommend she take her magnesium  supplement only  twice a week as this was mildly elevated. Overall good results!  ----- Message ----- From: Interface, [facility] Lab Results In Sent: 01/03/2024  12:36 PM EDT To: Katlyn D West, NP

## 2024-01-03 NOTE — Addendum Note (Signed)
 Addended by: FREDIRICK BEAU B on: 01/03/2024 09:39 AM   Modules accepted: Orders

## 2024-01-08 ENCOUNTER — Other Ambulatory Visit (HOSPITAL_COMMUNITY): Payer: Self-pay

## 2024-01-10 DIAGNOSIS — M533 Sacrococcygeal disorders, not elsewhere classified: Secondary | ICD-10-CM | POA: Diagnosis not present

## 2024-01-14 NOTE — Progress Notes (Unsigned)
  Electrophysiology Office Note:   ID:  [name], [name] Nov 01, 1945, MRN [id number]  Primary Cardiologist: Stanly DELENA Leavens, MD Electrophysiologist: Soyla Gladis Norton, MD  {Click to update primary MD,subspecialty MD or APP then REFRESH:1}    History of Present Illness:   [name] is a 78 y.o. female with h/o CAD (remote PCI to  RCA), tachy-brady w/PPM, AFib, CKD (III), HTN, HLD, chronic CHF (diastolic), OSA w/CPAP  seen today for overdue routine electrophysiology followup.   Last seen by EP during admission 12/2021. Has not sent remotes since 2023.  Since last being seen in our clinic the patient reports doing ***.  she denies chest pain, palpitations, dyspnea, PND, orthopnea, nausea, vomiting, dizziness, syncope, edema, weight gain, or early satiety.   Review of systems complete and found to be negative unless listed in HPI.   EP Information / Studies Reviewed:    {EKGtoday:28818}       PPM Interrogation-  reviewed in detail today,  See PACEART report.  Arrhythmia/Device History PPM-St.Jude   Physical Exam:   VS:  There were no vitals taken for this visit.   Wt Readings from Last 3 Encounters:  01/02/24 195 lb 6.4 oz (88.6 kg)  09/26/23 197 lb 9.6 oz (89.6 kg)  08/21/23 189 lb 8 oz (86 kg)     GEN: No acute distress  NECK: No JVD; No carotid bruits CARDIAC: {EPRHYTHM:28826}, no murmurs, rubs, gallops RESPIRATORY:  Clear to auscultation without rales, wheezing or rhonchi  ABDOMEN: Soft, non-tender, non-distended EXTREMITIES:  {EDEMA LEVEL:28147::No} edema; No deformity   ASSESSMENT AND PLAN:    Tachy-Brady syndrome s/p Abbott PPM  Normal PPM function See Pace Art report No changes today  Paroxysmal AF AFL EKG 01/02/2024 shows *** Continue tikosyn  250 mcg BID Stressed importance of regular follow up with EP provider while on Tikosyn .  Stable labs 01/02/2024  HTN Stable on current regimen  CAD No s/s of ischemia.      {Click here to Review PMH, Prob  List, Meds, Allergies, SHx, FHx  :1}   Disposition:   Follow up with {EPPROVIDERS:28135::EP Team} {EPFOLLOW UP:28173}  Signed, Ozell Prentice Passey, PA-C

## 2024-01-15 ENCOUNTER — Encounter: Payer: Self-pay | Admitting: Student

## 2024-01-15 ENCOUNTER — Ambulatory Visit: Attending: Cardiology | Admitting: Student

## 2024-01-15 ENCOUNTER — Ambulatory Visit: Payer: Self-pay | Admitting: Cardiology

## 2024-01-15 VITALS — BP 114/70 | HR 60 | Ht 66.5 in | Wt 196.0 lb

## 2024-01-15 DIAGNOSIS — I2510 Atherosclerotic heart disease of native coronary artery without angina pectoris: Secondary | ICD-10-CM

## 2024-01-15 DIAGNOSIS — I495 Sick sinus syndrome: Secondary | ICD-10-CM | POA: Diagnosis not present

## 2024-01-15 DIAGNOSIS — G4733 Obstructive sleep apnea (adult) (pediatric): Secondary | ICD-10-CM | POA: Diagnosis not present

## 2024-01-15 DIAGNOSIS — I272 Pulmonary hypertension, unspecified: Secondary | ICD-10-CM | POA: Diagnosis not present

## 2024-01-15 LAB — CUP PACEART INCLINIC DEVICE CHECK
Date Time Interrogation Session: 20250715113132
Implantable Lead Connection Status: 753985
Implantable Lead Connection Status: 753985
Implantable Lead Implant Date: 20081211
Implantable Lead Implant Date: 20081211
Implantable Lead Location: 753859
Implantable Lead Location: 753860
Implantable Pulse Generator Implant Date: 20081211
Pulse Gen Model: 5826
Pulse Gen Serial Number: 1999089

## 2024-01-15 NOTE — Patient Instructions (Signed)
 Medication Instructions:  Your physician recommends that you continue on your current medications as directed. Please refer to the Current Medication list given to you today.  *If you need a refill on your cardiac medications before your next appointment, please call your pharmacy*  Lab Work: BMET, CBC-TODAY If you have labs (blood work) drawn today and your tests are completely normal, you will receive your results only by: MyChart Message (if you have MyChart) OR A paper copy in the mail If you have any lab test that is abnormal or we need to change your treatment, we will call you to review the results.  Testing/Procedures: See letter  Follow-Up: At [facility], you and your health needs are our priority.  As part of our continuing mission to provide you with exceptional heart care, our providers are all part of one team.  This team includes your primary Cardiologist (physician) and Advanced Practice Providers or APPs (Physician Assistants and Nurse Practitioners) who all work together to provide you with the care you need, when you need it.  Your next appointment:   Follow up will be arranged for you and print out on your discharge summary after your procedure.

## 2024-01-16 LAB — CBC
Hematocrit: 42.4 % (ref 34.0–46.6)
Hemoglobin: 13.9 g/dL (ref 11.1–15.9)
MCH: 28.8 pg (ref 26.6–33.0)
MCHC: 32.8 g/dL (ref 31.5–35.7)
MCV: 88 fL (ref 79–97)
Platelets: 323 x10E3/uL (ref 150–450)
RBC: 4.82 x10E6/uL (ref 3.77–5.28)
RDW: 14.5 % (ref 11.7–15.4)
WBC: 11.9 x10E3/uL — ABNORMAL HIGH (ref 3.4–10.8)

## 2024-01-16 LAB — BASIC METABOLIC PANEL WITH GFR
BUN/Creatinine Ratio: 19 (ref 12–28)
BUN: 23 mg/dL (ref 8–27)
CO2: 25 mmol/L (ref 20–29)
Calcium: 10.1 mg/dL (ref 8.7–10.3)
Chloride: 95 mmol/L — ABNORMAL LOW (ref 96–106)
Creatinine, Ser: 1.22 mg/dL — ABNORMAL HIGH (ref 0.57–1.00)
Glucose: 98 mg/dL (ref 70–99)
Potassium: 5 mmol/L (ref 3.5–5.2)
Sodium: 139 mmol/L (ref 134–144)
eGFR: 46 mL/min/1.73 — ABNORMAL LOW (ref >59)

## 2024-01-17 ENCOUNTER — Other Ambulatory Visit: Payer: Self-pay

## 2024-01-17 DIAGNOSIS — J9611 Chronic respiratory failure with hypoxia: Secondary | ICD-10-CM

## 2024-01-21 ENCOUNTER — Ambulatory Visit (INDEPENDENT_AMBULATORY_CARE_PROVIDER_SITE_OTHER): Payer: Medicare Other

## 2024-01-21 VITALS — Ht 66.5 in | Wt 192.5 lb

## 2024-01-21 DIAGNOSIS — Z0000 Encounter for general adult medical examination without abnormal findings: Secondary | ICD-10-CM

## 2024-01-21 NOTE — Progress Notes (Signed)
 Subjective:   [name] is a 78 y.o. who presents for a Medicare Wellness preventive visit.  As a reminder, Annual Wellness Visits don't include a physical exam, and some assessments may be limited, especially if this visit is performed virtually. We may recommend an in-person follow-up visit with your provider if needed.  Visit Complete: Virtual I connected with  [name] on 01/21/24 by a audio enabled telemedicine application and verified that I am speaking with the correct person using two identifiers.  Patient Location: Home  Provider Location: Home Office  I discussed the limitations of evaluation and management by telemedicine. The patient expressed understanding and agreed to proceed.  Vital Signs: Because this visit was a virtual/telehealth visit, some criteria may be missing or patient reported. Any vitals not documented were not able to be obtained and vitals that have been documented are patient reported.    Persons Participating in Visit: Patient.  AWV Questionnaire: No: Patient Medicare AWV questionnaire was not completed prior to this visit.  Cardiac Risk Factors include: advanced age (>61men, >22 women);hypertension     Objective:    Today's Vitals   01/21/24 1004  Weight: 192 lb 8 oz (87.3 kg)  Height: 5' 6.5 (1.689 m)   Body mass index is 30.6 kg/m.     01/21/2024   10:12 AM 01/17/2023   11:01 AM 05/02/2022    7:47 AM 01/12/2022    9:59 AM 01/04/2022    7:01 AM 12/11/2021    6:52 AM 11/30/2020    8:57 AM  Advanced Directives  Does Patient Have a Medical Advance Directive? Yes Yes Yes Yes Yes Yes Yes  Type of Estate agent of [place];Living will Healthcare Power of [place];Living will Healthcare Power of [place];Living will Healthcare Power of [place];Living will Healthcare Power of [place];Living will Living will Healthcare Power of Attorney  Does patient want to make changes to medical advance directive?    No - Patient  declined No - Patient declined No - Patient declined   Copy of Healthcare Power of Attorney in Chart? No - copy requested No - copy requested No - copy requested No - copy requested No - copy requested  No - copy requested    Current Medications (verified) Outpatient Encounter Medications as of 01/21/2024  Medication Sig   apixaban  (ELIQUIS ) 5 MG TABS tablet Take 1 tablet (5 mg total) by mouth 2 (two) times daily.   atorvastatin  (LIPITOR) 40 MG tablet Take 1 tablet (40 mg total) by mouth daily.   BD PEN NEEDLE NANO 2ND GEN 32G X 4 MM MISC daily.   Continuous Glucose Receiver (DEXCOM G7 RECEIVER) DEVI Use as directed while on insulin    Continuous Glucose Sensor (DEXCOM G7 SENSOR) MISC Apply 1 sensor to skin as directed to monitor blood sugars. Replace with new sensor every 10 days.   diltiazem  (CARDIZEM  LA) 360 MG 24 hr tablet Take 1 tablet (360 mg total) by mouth daily.   dofetilide  (TIKOSYN ) 250 MCG capsule Take 1 capsule (250 mcg total) by mouth 2 (two) times daily with breakfast and at bedtime.   DULoxetine  (CYMBALTA ) 60 MG capsule Take 1 capsule (60 mg total) by mouth daily.   empagliflozin  (JARDIANCE ) 25 MG TABS tablet Take 1 tablet (25 mg total) by mouth daily before breakfast.   ezetimibe  (ZETIA ) 10 MG tablet Take 1 tablet (10 mg total) by mouth daily.   fluticasone  (FLONASE ) 50 MCG/ACT nasal spray Place 1 spray into both nostrils daily as needed  for allergies.   insulin  degludec (TRESIBA  FLEXTOUCH) 100 UNIT/ML FlexTouch Pen Inject 28 Units into the skin daily as directed   ipratropium (ATROVENT ) 0.06 % nasal spray Place 1 spray into both nostrils as needed for rhinitis.   loratadine  (CLARITIN ) 10 MG tablet TAKE 1 TABLET BY MOUTH EVERY DAY   metFORMIN  (GLUCOPHAGE ) 1000 MG tablet Take 1 tablet (1,000 mg total) by mouth 2 (two) times daily.   metolazone  (ZAROXOLYN ) 2.5 MG tablet Take 1 tablet (2.5 mg total) by mouth as directed. Weekly as needed for weight gain of 3 lb in 24 hours, 5 lb in  a week or leg swelling   metoprolol  tartrate (LOPRESSOR ) 50 MG tablet Take 1 tablet (50 mg total) by mouth every morning AND 2 tablets (100 mg total) every evening.   nitroGLYCERIN  (NITROSTAT ) 0.4 MG SL tablet Place 1 tablet (0.4 mg total) under the tongue every 5 (five) minutes x 3 doses as needed for chest pain.   NON FORMULARY CPAP at bedtime   nystatin -triamcinolone  (MYCOLOG II) cream Apply 1 application topically 2 (two) times daily as needed.   omeprazole  (PRILOSEC) 20 MG capsule Take 1 capsule (20 mg total) by mouth 2 (two) times daily before breakfast and at bedtime. (Patient taking differently: Take 20 mg by mouth 2 (two) times daily. Per patient taking 1 tablet in the morning and 1 tablet in the night.)   ondansetron  (ZOFRAN -ODT) 4 MG disintegrating tablet Take 1 tablet (4 mg total) by mouth every 8 (eight) hours as needed for nausea or vomiting.   Polyethyl Glycol-Propyl Glycol (SYSTANE) 0.4-0.3 % SOLN Place 1 drop into both eyes daily as needed (dry eyes).   sodium zirconium cyclosilicate  (LOKELMA ) 10 g PACK packet Take 1 dose if needed as directed by MARLA Mose NP   spironolactone  (ALDACTONE ) 25 MG tablet Take 0.5 tablets (12.5 mg total) by mouth daily.   tirzepatide  (MOUNJARO ) 15 MG/0.5ML Pen Inject 15 mg into the skin once a week.   torsemide  (DEMADEX ) 20 MG tablet Take 3 tablets (60 mg total) by mouth daily before breakfast   valACYclovir  (VALTREX ) 1000 MG tablet TAKE 2 TABLETS BY MOUTH EVERY 12 HOURS AS NEEDED   Facility-Administered Encounter Medications as of 01/21/2024  Medication   sodium chloride  flush (NS) 0.9 % injection 3 mL    Allergies (verified) Hydrocodone -guaifenesin  and Adhesive [tape]   History: Past Medical History:  Diagnosis Date   Anticoagulant long-term use    pradaxa    Anxiety    Arthritis    fingers, lower back (04/23/2017    CAD (coronary artery disease) 8010,7996   March 1984; post PTCA with bare-metal stenting to mid RCA in December 2004   CHF  (congestive heart failure) (HCC)    Chronic atrial fibrillation (HCC) 06/2007   Tachybradycardia pacemaker   Chronic kidney disease    10% function - ?R, other kidney is compensating     CVA (cerebral vascular accident) [facility]) [phone]   denies residual on 04/23/2017   Depression    Diplopia 06/19/2008   Qualifier: Diagnosis of  By: Charlett MD, Apolinar MARLA    Dysrhythmia    ATRIAL FIBRILATION   Edema of lower extremity    Hyperlipidemia    Hypertension    1981   Inferior myocardial infarction [facility])    acute inferior wall mi/other medical hx   Myocardial infarction (HCC)    1984, [id number]   Obesity    OSA on CPAP    last test- 2010   Pacemaker  Pneumonia 2014   tx. ----  [facility]   Pulmonary hypertension (HCC)    moderate pulmonary hypertension by 10/2016 echo and 10/2013 cardiac cath   Shortness of breath    Skin cancer    cut off right [place]ki; burned off LLE (04/23/2017)   Sleep apnea 1981   Spondylolisthesis    TIA (transient ischemic attack) 2008   Unspecified hemorrhoids without mention of complication 07/15/2003   Colonoscopy--Dr. Avram    Past Surgical History:  Procedure Laterality Date   ABDOMINAL HYSTERECTOMY     APPENDECTOMY  1984   ATRIAL FIBRILLATION ABLATION N/A 09/29/2016   Procedure: Atrial Fibrillation Ablation;  Surgeon: Will Gladis Norton, MD;  [facility] INVASIVE CV LAB;  Service: Cardiovascular;  Laterality: N/A;   ATRIAL FIBRILLATION ABLATION N/A 02/07/2018   Procedure: ATRIAL FIBRILLATION ABLATION;  Surgeon: Norton Soyla Gladis, MD;  [facility]: [facility] INVASIVE CV LAB;  Service: Cardiovascular;  Laterality: N/A;   ATRIAL FIBRILLATION ABLATION N/A 03/13/2019   Procedure: ATRIAL FIBRILLATION ABLATION;  Surgeon: Norton Soyla Gladis, MD;  [facility]: [facility] INVASIVE CV LAB;  Service: Cardiovascular;  Laterality: N/A;   ATRIAL FIBRILLATION ABLATION N/A 07/07/2020   Procedure: ATRIAL FIBRILLATION ABLATION;  Surgeon: Norton Soyla Gladis, MD;  [facility]  INVASIVE CV LAB;  Service: Cardiovascular;  Laterality: N/A;   BACK SURGERY     CARDIOVERSION N/A 09/12/2017   Procedure: CARDIOVERSION;  Surgeon: Jeffrie Oneil BROCKS, MD;  Location: [facility] ENDOSCOPY;  Service: Cardiovascular;  Laterality: N/A;   CARDIOVERSION N/A 12/13/2017   Procedure: CARDIOVERSION;  Surgeon: Francyne Headland, MD;  Location: [facility] ENDOSCOPY;  Service: Cardiovascular;  Laterality: N/A;   CARDIOVERSION N/A 02/18/2018   Procedure: CARDIOVERSION;  Surgeon: Maranda Leim DEL, MD;  Location: [facility] ENDOSCOPY;  Service: Cardiovascular;  Laterality: N/A;   CARDIOVERSION N/A 05/20/2018   Procedure: CARDIOVERSION;  Surgeon: Shlomo Wilbert SAUNDERS, MD;  Location: [facility] ENDOSCOPY;  Service: Cardiovascular;  Laterality: N/A;   CARDIOVERSION N/A 04/16/2019   Procedure: CARDIOVERSION;  Surgeon: Kate Lonni CROME, MD;  Location: [facility] ENDOSCOPY;  Service: Endoscopy;  Laterality: N/A;   CARDIOVERSION N/A 01/22/2020   Procedure: CARDIOVERSION;  Surgeon: Barbaraann Darryle Ned, MD;  Location: [facility] ENDOSCOPY;  Service: Cardiovascular;  Laterality: N/A;   CARDIOVERSION N/A 02/18/2020   Procedure: CARDIOVERSION;  Surgeon: Loni Soyla LABOR, MD;  Location: [facility] ENDOSCOPY;  Service: Cardiovascular;  Laterality: N/A;   CATARACT EXTRACTION W/ INTRAOCULAR LENS  IMPLANT, BILATERAL Bilateral 01/15/2017- 03/2017   CHOLECYSTECTOMY     CORONARY ANGIOPLASTY  X 2   CORONARY ANGIOPLASTY WITH STENT PLACEMENT  1998; ~ 2007; ?date   1 stent; replaced stent; not sure when I got the last stent (04/23/2017)   DOPPLER ECHOCARDIOGRAPHY  2009   ELECTROPHYSIOLOGIC STUDY N/A 03/31/2016   Procedure: Cardioversion;  Surgeon: Danelle LELON Birmingham, MD;  [facility];  Service: Cardiovascular;  Laterality: N/A;   ELECTROPHYSIOLOGIC STUDY N/A 08/04/2016   Procedure: Cardioversion;  Surgeon: Danelle LELON Birmingham, MD;  Location: [facility] INVASIVE CV LAB;  Service: Cardiovascular;  Laterality: N/A;   INSERT / REPLACE / REMOVE PACEMAKER  06/2007   IR RADIOLOGY  PERIPHERAL GUIDED IV START  01/31/2018   IR US  GUIDE VASC ACCESS LEFT  01/31/2018   JOINT REPLACEMENT     LAPAROSCOPIC CHOLECYSTECTOMY  1994   LEFT AND RIGHT HEART CATHETERIZATION WITH CORONARY ANGIOGRAM N/A 10/06/2013   Procedure: LEFT AND RIGHT HEART CATHETERIZATION WITH CORONARY ANGIOGRAM;  Surgeon: Ned LABOR Sor, MD;  Location: [facility] CATH LAB;  Service: Cardiovascular;  Laterality: N/A;  LEFT OOPHORECTOMY Left ~ 1989   POSTERIOR LUMBAR FUSION  2000s - 04/2015 X 3   L3-4; L4-5; L2-3; Dr Malcolm   RIGHT HEART CATH N/A 01/04/2022   Procedure: RIGHT HEART CATH;  Surgeon: Rolan Ezra RAMAN, MD;  Location: [facility] INVASIVE CV LAB;  Service: Cardiovascular;  Laterality: N/A;   RIGHT HEART CATH N/A 05/02/2022   Procedure: RIGHT HEART CATH;  Surgeon: Rolan Ezra RAMAN, MD;  Location: [facility] INVASIVE CV LAB;  Service: Cardiovascular;  Laterality: N/A;   RIGHT HEART CATH N/A 08/21/2023   Procedure: RIGHT HEART CATH;  Surgeon: Rolan Ezra RAMAN, MD;  Location: [facility] INVASIVE CV LAB;  Service: Cardiovascular;  Laterality: N/A;   RIGHT/LEFT HEART CATH AND CORONARY ANGIOGRAPHY N/A 05/09/2017   Procedure: RIGHT/LEFT HEART CATH AND CORONARY ANGIOGRAPHY;  Surgeon: Wonda Sharper, MD;  Location: [facility] INVASIVE CV LAB;  Service: Cardiovascular;  Laterality: N/A;   SKIN CANCER EXCISION Right    [place]   TEE WITHOUT CARDIOVERSION N/A 09/29/2016   Procedure: TRANSESOPHAGEAL ECHOCARDIOGRAM (TEE);  Surgeon: Oneil JAYSON Parchment, MD;  Location: [facility] ENDOSCOPY;  Service: Cardiovascular;  Laterality: N/A;   TOTAL ABDOMINAL HYSTERECTOMY  1984   uterus & right ovary   TOTAL KNEE ARTHROPLASTY Left 04/23/2017   TOTAL KNEE ARTHROPLASTY Left 04/23/2017   Procedure: TOTAL KNEE ARTHROPLASTY;  Surgeon: Liam Lerner, MD;  Location: [facility] OR;  Service: Orthopedics;  Laterality: Left;   ULTRASOUND GUIDANCE FOR VASCULAR ACCESS  05/09/2017   Procedure: Ultrasound Guidance For Vascular Access;  Surgeon: Wonda Sharper, MD;  Location: [facility] INVASIVE CV LAB;  Service:  Cardiovascular;;   Family History  Problem Relation Age of Onset   Suicidality Father        suicide death pt was 3 yrs, 45   Arrhythmia Mother    Hypertension Mother    Diabetes Mother    Dementia Mother    Heart attack Brother    Heart disease Paternal Aunt    Prostate cancer Maternal Grandfather    Diabetes Paternal Grandfather        fathers side of the family   Colon cancer Maternal Aunt    Social History   Socioeconomic History   Marital status: Married    Spouse name: Financial risk analyst   Number of children: 0   Years of education: HS   Highest education level: High school graduate  Occupational History   Occupation: retired    Comment: previously worked [employer]  Tobacco Use   Smoking status: Former    Current packs/day: 0.00    Average packs/day: 1 pack/day for 5.0 years (5.0 ttl pk-yrs)    Types: Cigarettes    Start date: 05/11/1978    Quit date: 07/03/1982    Years since quitting: 41.5   Smokeless tobacco: Never  Vaping Use   Vaping status: Never Used  Substance and Sexual Activity   Alcohol  use: Yes   Drug use: No   Sexual activity: Not Currently  Other Topics Concern   Not on file  Social History Narrative   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   Married    Originally from [place]    Household of two, high school education   Former smoker Therapist, nutritional dogs 7    Retired from [employer]   [id number]   [employer] 2    Social Drivers of [employer]: Low Risk  (01/21/2024)   Overall Financial Resource Strain (CARDIA)    Difficulty of Paying  Living Expenses: Not hard at all  Food Insecurity: No Food Insecurity (01/21/2024)   Hunger Vital Sign    Worried About Running Out of Food in the Last Year: Never true    Ran Out of Food in the Last Year: Never true  Transportation Needs: No Transportation Needs (01/21/2024)   PRAPARE - Administrator, Civil Service (Medical): No    Lack of  Transportation (Non-Medical): No  Physical Activity: Insufficiently Active (01/21/2024)   Exercise Vital Sign    Days of Exercise per Week: 3 days    Minutes of Exercise per Session: 20 min  Stress: No Stress Concern Present (01/21/2024)   [employer] of Occupational Health - Occupational Stress Questionnaire    Feeling of Stress: Not at all  Social Connections: Socially Integrated (01/21/2024)   Social Connection and Isolation Panel    Frequency of Communication with Friends and Family: More than three times a week    Frequency of Social Gatherings with Friends and Family: More than three times a week    Attends Religious Services: More than 4 times per year    Active Member of [employer] or Organizations: Yes    Attends Engineer, structural: More than 4 times per year    Marital Status: Married    Tobacco Counseling Counseling given: Not Answered    Clinical Intake:  Pre-visit preparation completed: Yes  Pain : No/denies pain     BMI - recorded: 30.6 Nutritional Status: BMI > 30  Obese Nutritional Risks: None Diabetes: Yes CBG done?: Yes (CBG 111 per patient/ monitor) CBG resulted in Enter/ Edit results?: Yes Did pt. bring in CBG monitor from home?: No  Lab Results  Component Value Date   HGBA1C 6.1 (A) 02/28/2023   HGBA1C 7.0 (A) 04/18/2022   HGBA1C 7.7 (A) 12/26/2021     How often do you need to have someone help you when you read instructions, pamphlets, or other written materials from your doctor or pharmacy?: 1 - Never  Interpreter Needed?: No  Information entered by :: Rojelio Blush LPN   Activities of Daily Living     01/21/2024   10:11 AM  In your present state of health, do you have any difficulty performing the following activities:  Hearing? 0  Vision? 0  Difficulty concentrating or making decisions? 0  Walking or climbing stairs? 0  Dressing or bathing? 0  Doing errands, shopping? 0  Preparing Food and eating ? N  Using the Toilet?  N  In the past six months, have you accidently leaked urine? N  Do you have problems with loss of bowel control? N  Managing your Medications? N  Managing your Finances? N  Housekeeping or managing your Housekeeping? N    Patient Care Team: [name], Apolinar POUR, MD as PCP - General (Internal Medicine) Inocencio Soyla Lunger, MD as PCP - Electrophysiology (Cardiology) Santo Stanly LABOR, MD as PCP - Cardiology (Cardiology) Melodi Lerner, MD as Consulting Physician (Orthopedic Surgery) Geronimo Amel, MD as Consulting Physician (Pulmonary Disease) Lionell Jon DEL, [facility] (Pharmacist) Santo Stanly LABOR, MD as Consulting Physician (Cardiology)  I have updated your Care Teams any recent Medical Services you may have received from other providers in the past year.     Assessment:   This is a routine wellness examination for Niambi.  Hearing/Vision screen Hearing Screening - Comments:: Denies hearing difficulties   Vision Screening - Comments:: Wears rx glasses - up to date with routine eye exams with  Dr  Groat   Goals Addressed               This Visit's Progress     Increase physical activity (pt-stated)        Lose weight.       Depression Screen     01/21/2024   10:11 AM 03/21/2023    2:29 PM 02/28/2023   10:27 AM 01/17/2023   11:00 AM 04/18/2022   12:19 PM 04/11/2022    1:20 PM 01/12/2022    9:50 AM  PHQ 2/9 Scores  PHQ - 2 Score 0  0 0 1 0 0  PHQ- 9 Score   2  3 0 0  Exception Documentation  Patient refusal         Fall Risk     01/21/2024   10:11 AM 03/21/2023    2:29 PM 02/28/2023   10:26 AM 01/17/2023   11:01 AM 04/18/2022   12:20 PM  Fall Risk   Falls in the past year? 0 0 0 0 0  Number falls in past yr: 0 0 0 0 0  Injury with Fall? 0 0 0 0 0  Risk for fall due to : No Fall Risks No Fall Risks No Fall Risks No Fall Risks No Fall Risks  Follow up Falls evaluation completed Falls evaluation completed Falls evaluation completed Falls prevention  discussed Falls evaluation completed      Data saved with a previous flowsheet row definition    MEDICARE RISK AT HOME:  Medicare Risk at Home Any stairs in or around the home?: Yes If so, are there any without handrails?: No Home free of loose throw rugs in walkways, pet beds, electrical cords, etc?: Yes Adequate lighting in your home to reduce risk of falls?: Yes Life alert?: Yes Use of a cane, walker or w/c?: Yes Grab bars in the bathroom?: Yes Shower chair or bench in shower?: Yes Elevated toilet seat or a handicapped toilet?: Yes  TIMED UP AND GO:  Was the test performed?  No  Cognitive Function: 6CIT completed    11/22/2016    3:08 PM  MMSE - Mini Mental State Exam  Not completed: --        01/21/2024   10:12 AM 01/17/2023   11:02 AM 01/12/2022    9:59 AM 11/30/2020    8:59 AM  6CIT Screen  What Year? 0 points 0 points 0 points 0 points  What month? 0 points 0 points 0 points 0 points  What time? 0 points 0 points 0 points 0 points  Count back from 20 0 points 0 points 0 points 0 points  Months in reverse 0 points 0 points 0 points 0 points  Repeat phrase 0 points 0 points 0 points 2 points  Total Score 0 points 0 points 0 points 2 points    Immunizations Immunization History  Administered Date(s) Administered   Fluad  Quad(high Dose 65+) 04/10/2019, 04/20/2021, 04/04/2022   Fluad  Trivalent(High Dose 65+) 04/23/2023   Influenza Split 04/18/2011, 05/06/2012   Influenza Whole 07/04/2007, 06/02/2009, 03/01/2010   Influenza, High Dose Seasonal PF 04/03/2014, 04/18/2016, 04/05/2017, 05/06/2018   Influenza,inj,Quad PF,6+ Mos 03/07/2013, 04/06/2015   PFIZER Comirnaty (Gray Top)Covid-19 Tri-Sucrose Vaccine 04/05/2022   PFIZER(Purple Top)SARS-COV-2 Vaccination 07/22/2019, 08/10/2019, 03/29/2020   PNEUMOCOCCAL CONJUGATE-20 04/22/2021   Pfizer Covid-19 Vaccine Bivalent Booster 74yrs & up 04/22/2021   Pfizer(Comirnaty )Fall Seasonal Vaccine 12 years and older 04/23/2023    Pneumococcal Conjugate-13 09/24/2013, 04/20/2020   Pneumococcal Polysaccharide-23 07/03/2005, 07/04/2007, 04/18/2011, 04/05/2022  Respiratory Syncytial Virus Vaccine ,Recomb Aduvanted(Arexvy ) 04/05/2022   Td 06/02/2009   Zoster Recombinant(Shingrix) 04/09/2020, 10/24/2022   Zoster, Live 07/03/2006    Screening Tests Health Maintenance  Topic Date Due   Diabetic kidney evaluation - Urine ACR  Never done   DTaP/Tdap/Td (2 - Tdap) 06/03/2019   FOOT EXAM  01/27/2021   HEMOGLOBIN A1C  08/31/2023   COVID-19 Vaccine (7 - Pfizer risk 2024-25 season) 10/22/2023   INFLUENZA VACCINE  02/01/2024   OPHTHALMOLOGY EXAM  03/26/2024   Diabetic kidney evaluation - eGFR measurement  01/14/2025   Medicare Annual Wellness (AWV)  01/20/2025   Pneumococcal Vaccine: 50+ Years  Completed   DEXA SCAN  Completed   Hepatitis C Screening  Completed   Zoster Vaccines- Shingrix  Completed   Hepatitis B Vaccines  Aged Out   HPV VACCINES  Aged Out   Meningococcal B Vaccine  Aged Out   Colonoscopy  Discontinued    Health Maintenance  Health Maintenance Due  Topic Date Due   Diabetic kidney evaluation - Urine ACR  Never done   DTaP/Tdap/Td (2 - Tdap) 06/03/2019   FOOT EXAM  01/27/2021   HEMOGLOBIN A1C  08/31/2023   COVID-19 Vaccine (7 - Pfizer risk 2024-25 season) 10/22/2023   Health Maintenance Items Addressed: Labs and vaccines deferred  Additional Screening:  Vision Screening: Recommended annual ophthalmology exams for early detection of glaucoma and other disorders of the eye. Would you like a referral to an eye doctor? No    Dental Screening: Recommended annual dental exams for proper oral hygiene  Community Resource Referral / Chronic Care Management: CRR required this visit?  No   CCM required this visit?  No   Plan:    I have personally reviewed and noted the following in the patient's chart:   Medical and social history Use of alcohol , tobacco or illicit drugs  Current  medications and supplements including opioid prescriptions. Patient is not currently taking opioid prescriptions. Functional ability and status Nutritional status Physical activity Advanced directives List of other physicians Hospitalizations, surgeries, and ER visits in previous 12 months Vitals Screenings to include cognitive, depression, and falls Referrals and appointments  In addition, I have reviewed and discussed with patient certain preventive protocols, quality metrics, and best practice recommendations. A written personalized care plan for preventive services as well as general preventive health recommendations were provided to patient.   Rojelio LELON Blush, LPN   [id number]/[id number]   After Visit Summary: (MyChart) Due to this being a telephonic visit, the after visit summary with patients personalized plan was offered to patient via MyChart   Notes: Nothing significant to report at this time.

## 2024-01-21 NOTE — Patient Instructions (Addendum)
 [name] , Thank you for taking time out of your busy schedule to complete your Annual Wellness Visit with me. I enjoyed our conversation and look forward to speaking with you again next year. I, as well as your care team,  appreciate your ongoing commitment to your health goals. Please review the following plan we discussed and let me know if I can assist you in the future. Your Game plan/ To Do List    Referrals: If you haven't heard from the office you've been referred to, please reach out to them at the phone provided.    Follow up Visits: Next Medicare AWV with our clinical staff: 01/26/25 @ 10a   Have you seen your provider in the last 6 months (3 months if uncontrolled diabetes)?  Next Office Visit with your provider:   Clinician Recommendations:  Aim for 30 minutes of exercise or brisk walking, 6-8 glasses of water , and 5 servings of fruits and vegetables each day.       This is a list of the screening recommended for you and due dates:  Health Maintenance  Topic Date Due   Yearly kidney health urinalysis for diabetes  Never done   DTaP/Tdap/Td vaccine (2 - Tdap) 06/03/2019   Complete foot exam   01/27/2021   Hemoglobin A1C  08/31/2023   COVID-19 Vaccine (7 - Pfizer risk 2024-25 season) 10/22/2023   Flu Shot  02/01/2024   Eye exam for diabetics  03/26/2024   Yearly kidney function blood test for diabetes  01/14/2025   Medicare Annual Wellness Visit  01/20/2025   Pneumococcal Vaccine for age over 57  Completed   DEXA scan (bone density measurement)  Completed   Hepatitis C Screening  Completed   Zoster (Shingles) Vaccine  Completed   Hepatitis B Vaccine  Aged Out   HPV Vaccine  Aged Out   Meningitis B Vaccine  Aged Out   Colon Cancer Screening  Discontinued    Advanced directives: (Copy Requested) Please bring a copy of your health care power of attorney and living will to the office to be added to your chart at your convenience. You can mail to [facility] [address] [place], [place] [address] or email to [email or web address] Advance Care Planning is important because it:  [x]  Makes sure you receive the medical care that is consistent with your values, goals, and preferences  [x]  It provides guidance to your family and loved ones and reduces their decisional burden about whether or not they are making the right decisions based on your wishes.  Follow the link provided in your after visit summary or read over the paperwork we have mailed to you to help you started getting your Advance Directives in place. If you need assistance in completing these, please reach out to us  so that we can help you!  See attachments for Preventive Care and Fall Prevention Tips.

## 2024-01-23 ENCOUNTER — Other Ambulatory Visit: Payer: Self-pay | Admitting: Internal Medicine

## 2024-01-23 ENCOUNTER — Other Ambulatory Visit (HOSPITAL_COMMUNITY): Payer: Self-pay

## 2024-01-23 MED ORDER — OMEPRAZOLE 20 MG PO CPDR
20.0000 mg | DELAYED_RELEASE_CAPSULE | Freq: Two times a day (BID) | ORAL | 3 refills | Status: DC
  Filled 2024-01-25: qty 60, 30d supply, fill #0
  Filled 2024-02-21: qty 60, 30d supply, fill #1
  Filled 2024-04-05 (×2): qty 60, 30d supply, fill #2
  Filled 2024-06-04: qty 60, 30d supply, fill #3

## 2024-01-24 ENCOUNTER — Ambulatory Visit: Admitting: Internal Medicine

## 2024-01-24 ENCOUNTER — Encounter: Payer: Self-pay | Admitting: Internal Medicine

## 2024-01-24 VITALS — BP 116/64 | HR 67 | Ht 66.0 in | Wt 198.0 lb

## 2024-01-24 DIAGNOSIS — J849 Interstitial pulmonary disease, unspecified: Secondary | ICD-10-CM | POA: Diagnosis not present

## 2024-01-24 DIAGNOSIS — Z6831 Body mass index (BMI) 31.0-31.9, adult: Secondary | ICD-10-CM

## 2024-01-24 DIAGNOSIS — J9611 Chronic respiratory failure with hypoxia: Secondary | ICD-10-CM

## 2024-01-24 DIAGNOSIS — G4733 Obstructive sleep apnea (adult) (pediatric): Secondary | ICD-10-CM

## 2024-01-24 DIAGNOSIS — I2729 Other secondary pulmonary hypertension: Secondary | ICD-10-CM | POA: Diagnosis not present

## 2024-01-24 DIAGNOSIS — Z87891 Personal history of nicotine dependence: Secondary | ICD-10-CM | POA: Diagnosis not present

## 2024-01-24 DIAGNOSIS — E66812 Obesity, class 2: Secondary | ICD-10-CM | POA: Diagnosis not present

## 2024-01-24 LAB — PULMONARY FUNCTION TEST
DL/VA % pred: 102 %
DL/VA: 4.11 ml/min/mmHg/L
DLCO cor % pred: 63 %
DLCO cor: 13.05 ml/min/mmHg
DLCO unc % pred: 64 %
DLCO unc: 13.24 ml/min/mmHg
FEF 25-75 Pre: 1.36 L/s
FEF2575-%Pred-Pre: 79 %
FEV1-%Pred-Pre: 57 %
FEV1-Pre: 1.31 L
FEV1FVC-%Pred-Pre: 107 %
FEV6-%Pred-Pre: 55 %
FEV6-Pre: 1.62 L
FEV6FVC-%Pred-Pre: 103 %
FVC-%Pred-Pre: 53 %
FVC-Pre: 1.64 L
Pre FEV1/FVC ratio: 80 %
Pre FEV6/FVC Ratio: 98 %

## 2024-01-24 NOTE — Progress Notes (Signed)
Spiro/DLCO performed today. 

## 2024-01-24 NOTE — Patient Instructions (Addendum)
 ICD-10-CM   1. ILD (interstitial lung disease) (HCC)  J84.9 CT Chest High Resolution      #Interstitial lung disease/hypersensitive pneumonitis  -I believe that he has some amount of mild ILD based on the upper lobe infiltrates, Aspergillus antibody positive but this is stable over time -In the past CCP antibody positive  Plan - Do HRCT supine and prone in 6 months as way to monitor ILD progession - - Took shared decision making not to do steroids or other immunosuppressants given stability and side effect profile of the medications   #Pulmonary hypertension -postcapillary in July 2023 but precapillary in October 2023 after diuresis   -Probably multifactorial associated with the mild ILD, obesity and sleep apnea  - RHC Feb 2025 close to normal  Plan   -Rx by CHF services Dr Rolan  #Chronic hypoxemic respiratory failure   - exercise (simple) 1 min sit stand test without desaturation  - room air pulse ox normal - this is normal and mproved with control of pulmonary hypertension and weight loss  Plan - MOnitor o2 use  - use only if pulse ox < 88%  Obesity  - weight 198# on 01/24/2024 and ongoing weight loss (intentional)  Plan  - weight loss to continue congratulation  #OSA  - maybe this is also improved  Plan  - please visit with Dr Neysa about night cPAP settings and night o2 use  FOllowup  - 6 months but after HRCT 15-minute visit

## 2024-01-24 NOTE — Progress Notes (Signed)
 OV 01/18/2023  Subjective:  Patient ID: [name], female , DOB: 12/03/1945 , age 78 y.o. , MRN: [id number] , ADDRESS: [id number] [place] [address] PCP [name], Apolinar POUR, MD Patient Care Team: Charlett Apolinar POUR, MD as PCP - General (Internal Medicine) Inocencio Soyla Lunger, MD as PCP - Electrophysiology (Cardiology) Hobart Powell BRAVO, MD as PCP - Cardiology (Cardiology) Melodi Lerner, MD as Consulting Physician (Orthopedic Surgery) Geronimo Amel, MD as Consulting Physician (Pulmonary Disease) Burnard Debby LABOR, MD as Consulting Physician (Cardiology) Lionell Jon DEL, [facility] (Inactive) (Pharmacist)  This Provider for this visit: Treatment Team:  Attending Provider: Geronimo Amel, MD  Dr. Neysa  01/18/2023 -   Chief Complaint  Patient presents with   Follow-up    No problems.  Breathing at baseline.     HPI [name] 78 y.o. -returns for follow-up.  Presents with her husband.  Overall she reports stability in health.  Since her last visit she started Mounjaro  and she is lost 15 pounds of weight.  She is happy about this.  She and her husband attest that weight loss is the primary goal.  Husband is an independent historian.  She had pulmonary function test and this shows stability over few years.  She had CT scan of the chest that I personally reviewed and this also shows stability.  I believe she has ILD.  She did have crackles on the exam in the upper lobe.  Features seem to be more consistent with hypertensive pneumonitis even though she does have some autoimmune antibodies positive.  In any event she is stable.  We again discussed antifibrotic's and we took a shared decision making just to monitor because she is stable.    Right Heart Pressures RHC Procedural Findings: Hemodynamics (mmHg) - OCT 2023 RA mean 4 RV 49/7 PA 50/24, mean 35 PCWP mean 16  Oxygen  saturations: PA 74% AO 95%  Cardiac Output (Fick) 6.4  Cardiac Index (Fick) 2.99 PVR 2.97  WU    CT Chest data from date:   - personally visualized and independently interpreted : April 2024 - my findings are: STABLE agree with the concern of HP   OV 01/24/2024  Subjective:  Patient ID: [name], female , DOB: February 24, 1946 , age 78 y.o. , MRN: [id number] , ADDRESS: [address] [place] [address] PCP [name], Apolinar POUR, MD Patient Care Team: Charlett Apolinar POUR, MD as PCP - General (Internal Medicine) Inocencio Soyla Lunger, MD as PCP - Electrophysiology (Cardiology) Santo Stanly LABOR, MD as PCP - Cardiology (Cardiology) Melodi Lerner, MD as Consulting Physician (Orthopedic Surgery) Geronimo Amel, MD as Consulting Physician (Pulmonary Disease) Lionell Jon DEL, [facility] (Pharmacist) Santo Stanly LABOR, MD as Consulting Physician (Cardiology)  This Provider for this visit: Treatment Team:  Attending Provider: Geronimo Amel, MD  #Multifactorial chronic hypoxic respiratory failure  -Possible ILD with hypersensitive pneumonitis [CT scan in 2023 showed air trapping with possible groundglass and serum hypersensitivity Aspergillus profile positive but no known exposures]  -Mildly elevated CCP antibody June 2023 and November 2021 but otherwise serology negative  -ANA +1: 80 in 2020 and 2019  -Elevated IgE 302 and November 2023  -Positive Aspergillus fumigatus antibody June 2023  -Pulmonary hypertension   -Venous congestion with elevated wedge in summer 2023 followed by euvolemia but still persistent elevated mean pulmonary pressure in the 30s in October 2023   - July 2023: PA Meam 45, PCWP 25 with v waver to 35, PV 2.6, CI 3.5   -  OCt 2023: PA mean 35, PCWP 16, PVR 2.97, CI 2.99   - FEb 202025: RHC PA mean 19, PVR 2.2 and PCWP 7 -Obesity  -Morbid and started on Mounjaro  June 2023   Sleep  apnea on CPAP followed by   01/24/2024 -   Chief Complaint  Patient presents with   Follow-up    Chronic respiratory failure- PFT before appointment, SOB during  exertion. Pt states she is doing okay.     HPI [name] 78 y.o. - returns for followup. This is 1 year followu. IN this time she feels dyspnea has improved but score is same but she subjectively feels otherwise. Lost weight significantly . Still o n mounjaro . Energy better. Cough gone. Had repeat RHC in Feb 2025 and is improved/normal. REamins on torsemide . Has seen EP and CHF x 3 in total this year . She stil using o2. Says in April 2205 Dr Rolan increased it to 4L [place] (do not easily see that documentation) but today here room air and 1 min sit stand test are normal. She does use cPAP for her OSA. SHe had PFT today and this is stable - making it likely ILD is still stable     SYMPTOM SCALE - ILD 04/04/2022 232# 01/24/2024 198#  Current weight    O2 use 3L at home 4L rest  Shortness of Breath 0 -> 5 scale with 5 being worst (score 6 If unable to do)   At rest 5 2  Simple tasks - showers, clothes change, eating, shaving 3 4  Household (dishes, doing bed, laundry) 2 5  Shopping 2 4  Walking level at own pace 4 3  Walking up Stairs 2 4  Total (30-36) Dyspnea Score 18 22  How bad is your cough? 4 0  How bad is your fatigue 4 3  How bad is nausea 0 0  How bad is vomiting?  0 0  How bad is diarrhea? 2 0  How bad is anxiety? 3   How bad is depression 2 1  Any chronic pain - if so where and how bad x 1       Simple office walk 185 feet x  3 laps goal with forehead probe 09/21/2020  04/04/2022  01/18/2023   O2 used ra Ra walk (uses 3L o2 at [employer]) - IMPROVED   Number laps completed avg Only 2 of 3 and stopped due to fatigue, back pain and dyspnea   Comments about pace 3 lap 2   Resting Pulse Ox/HR 94% and 66/min 92% ANd HR 61   Final Pulse Ox/HR 91% and 90/min 92% and HR 73   Desaturated </= 88% no no   Desaturated <= 3% points yes no   Got Tachycardic >/= 90/min yes no   Symptoms at end of test soe dyspnea Dyspnea, back pain   Miscellaneous comments x DID NOT deags           SIT STAND TEST - goal 15 times   01/24/2024    O2 used ra   PRobe - finter or forehead forhead   Number sit and stand completed - goal 15 15   Time taken to complete 1 min and 20 sec   Resting Pulse Ox/HR/Dyspnea  93% and 70/min and dyspnea of 4/10    Peak measures 93 % and 82/min and dyspnea of 5/10   Final Pulse Ox/HR 92% and 3/min and dyspnea of 75/10   Desaturated </= 88% no   Desaturated <=  3% points no   Got Tachycardic >/= 90/min no   Miscellaneous comments imprvdd      PFT     Latest Ref Rng & Units 01/24/2024    8:59 AM 01/10/2023    4:39 PM 04/14/2022   10:52 AM 11/03/2016   11:34 AM  PFT Results  FVC-Pre L 1.64  P  1.49  1.38   FVC-Predicted Pre % 53  P 42  48  41   FVC-Post L  1.42  1.53  1.55   FVC-Predicted Post %  46  49  47   Pre FEV1/FVC % % 80  P 86  83  81   Post FEV1/FCV % %  86  87  85   FEV1-Pre L 1.31  P 1.12  1.25  1.12   FEV1-Predicted Pre % 57  P 48  53  44   FEV1-Post L  1.22  1.33  1.32   DLCO uncorrected ml/min/mmHg 13.24  P 15.37  15.83  14.71   DLCO UNC% % 64  P 74  76  53   DLCO corrected ml/min/mmHg 13.05  P 15.37  14.91  14.06   DLCO COR %Predicted % 63  P 74  72  50   DLVA Predicted % 102  P 127  135  88   TLC L  4.41  4.34  3.76   TLC % Predicted %  81  79  69   RV % Predicted %  110  111  103     P Preliminary result       LAB RESULTS last 96 hours No results found.       has a past medical history of Anticoagulant long-term use, Anxiety, Arthritis, CAD (coronary artery disease) (8010,7996), CHF (congestive heart failure) (HCC), Chronic atrial fibrillation (HCC) (06/2007), Chronic kidney disease, CVA (cerebral vascular accident) (HCC) (7994,7991), Depression, Diplopia (06/19/2008), Dysrhythmia, Edema of lower extremity, Hyperlipidemia, Hypertension, Inferior myocardial infarction [facility]), Myocardial infarction (HCC), Obesity, OSA on CPAP, Pacemaker, Pneumonia (2014), Pulmonary hypertension (HCC), Shortness of  breath, Skin cancer, Sleep apnea (1981), Spondylolisthesis, TIA (transient ischemic attack) (2008), and Unspecified hemorrhoids without mention of complication (07/15/2003).   reports that she quit smoking about 41 years ago. Her smoking use included cigarettes. She started smoking about 45 years ago. She has a 5 pack-year smoking history. She has never used smokeless tobacco.  Past Surgical History:  Procedure Laterality Date   ABDOMINAL HYSTERECTOMY     APPENDECTOMY  1984   ATRIAL FIBRILLATION ABLATION N/A 09/29/2016   Procedure: Atrial Fibrillation Ablation;  Surgeon: Will Gladis Norton, MD;  Location: [facility] INVASIVE CV LAB;  Service: Cardiovascular;  Laterality: N/A;   ATRIAL FIBRILLATION ABLATION N/A 02/07/2018   Procedure: ATRIAL FIBRILLATION ABLATION;  Surgeon: Norton Soyla Gladis, MD;  Location: [facility] INVASIVE CV LAB;  Service: Cardiovascular;  Laterality: N/A;   ATRIAL FIBRILLATION ABLATION N/A 03/13/2019   Procedure: ATRIAL FIBRILLATION ABLATION;  Surgeon: Norton Soyla Gladis, MD;  Location: [facility]VAS[facility] CV LAB;  Service: Cardiovascular;  Laterality: N/A;   ATRIAL FIBRILLATION ABLATION N/A 07/07/2020   Procedure: ATRIAL FIBRILLATION ABLATION;  Surgeon: Norton Soyla Gladis, MD;  [facility]: [facility];  Service: Cardiovascular;  Laterality: N/A;   BACK SURGERY     CARDIOVERSION N/A 09/12/2017   Procedure: CARDIOVERSION;  Surgeon: Jeffrie Oneil BROCKS, MD;  Location: [facility] ENDOSCOPY;  Service: Cardiovascular;  Laterality: N/A;   CARDIOVERSION N/A 12/13/2017   Procedure: CARDIOVERSION;  Surgeon: Francyne Headland, MD;  Location: [facility] ENDOSCOPY;  Service: Cardiovascular;  Laterality: N/A;   CARDIOVERSION N/A 02/18/2018   Procedure: CARDIOVERSION;  Surgeon: Maranda Leim DEL, MD;  Location: [facility] ENDOSCOPY;  Service: Cardiovascular;  Laterality: N/A;   CARDIOVERSION N/A 05/20/2018   Procedure: CARDIOVERSION;  Surgeon: Shlomo Wilbert SAUNDERS, MD;  Location: [name] ENDOSCOPY;  Service: Cardiovascular;  Laterality: N/A;    CARDIOVERSION N/A 04/16/2019   Procedure: CARDIOVERSION;  Surgeon: Kate Lonni CROME, MD;  Location: [facility] ENDOSCOPY;  Service: Endoscopy;  Laterality: N/A;   CARDIOVERSION N/A 01/22/2020   Procedure: CARDIOVERSION;  Surgeon: Barbaraann Darryle Ned, MD;  Location: [facility] ENDOSCOPY;  Service: Cardiovascular;  Laterality: N/A;   CARDIOVERSION N/A 02/18/2020   Procedure: CARDIOVERSION;  Surgeon: Loni Soyla LABOR, MD;  Location: [facility] ENDOSCOPY;  Service: Cardiovascular;  Laterality: N/A;   CATARACT EXTRACTION W/ INTRAOCULAR LENS  IMPLANT, BILATERAL Bilateral 01/15/2017- 03/2017   CHOLECYSTECTOMY     CORONARY ANGIOPLASTY  X 2   CORONARY ANGIOPLASTY WITH STENT PLACEMENT  1998; ~ 2007; ?date   1 stent; replaced stent; not sure when I got the last stent (04/23/2017)   DOPPLER ECHOCARDIOGRAPHY  2009   ELECTROPHYSIOLOGIC STUDY N/A 03/31/2016   Procedure: Cardioversion;  Surgeon: Danelle LELON Birmingham, MD;  Location: [facility]AS[facility];  Service: Cardiovascular;  Laterality: N/A;   ELECTROPHYSIOLOGIC STUDY N/A 08/04/2016   Procedure: Cardioversion;  Surgeon: Danelle LELON Birmingham, MD;  Location: [facility] INVASIVE CV LAB;  Service: Cardiovascular;  Laterality: N/A;   INSERT / REPLACE / REMOVE PACEMAKER  06/2007   IR RADIOLOGY PERIPHERAL GUIDED IV START  01/31/2018   IR US  GUIDE VASC ACCESS LEFT  01/31/2018   JOINT REPLACEMENT     LAPAROSCOPIC CHOLECYSTECTOMY  1994   LEFT AND RIGHT HEART CATHETERIZATION WITH CORONARY ANGIOGRAM N/A 10/06/2013   Procedure: LEFT AND RIGHT HEART CATHETERIZATION WITH CORONARY ANGIOGRAM;  Surgeon: Ned LABOR Sor, MD;  Location: [facility] CATH LAB;  Service: Cardiovascular;  Laterality: N/A;   LEFT OOPHORECTOMY Left ~ 1989   POSTERIOR LUMBAR FUSION  2000s - 04/2015 X 3   L3-4; L4-5; L2-3; Dr Malcolm   RIGHT HEART CATH N/A 01/04/2022   Procedure: RIGHT HEART CATH;  Surgeon: Rolan Ezra RAMAN, MD;  Location: [facility] INVASIVE CV LAB;  Service: Cardiovascular;  Laterality: N/A;   RIGHT HEART CATH N/A 05/02/2022    Procedure: RIGHT HEART CATH;  Surgeon: Rolan Ezra RAMAN, MD;  Location: [facility] INVASIVE CV LAB;  Service: Cardiovascular;  Laterality: N/A;   RIGHT HEART CATH N/A 08/21/2023   Procedure: RIGHT HEART CATH;  Surgeon: Rolan Ezra RAMAN, MD;  Location: [facility] INVASIVE CV LAB;  Service: Cardiovascular;  Laterality: N/A;   RIGHT/LEFT HEART CATH AND CORONARY ANGIOGRAPHY N/A 05/09/2017   Procedure: RIGHT/LEFT HEART CATH AND CORONARY ANGIOGRAPHY;  Surgeon: Wonda Sharper, MD;  Location: [facility] INVASIVE CV LAB;  Service: Cardiovascular;  Laterality: N/A;   SKIN CANCER EXCISION Right    [place]is   TEE WITHOUT CARDIOVERSION N/A 09/29/2016   Procedure: TRANSESOPHAGEAL ECHOCARDIOGRAM (TEE);  Surgeon: Oneil JAYSON Parchment, MD;  Location: [facility] ENDOSCOPY;  Service: Cardiovascular;  Laterality: N/A;   TOTAL ABDOMINAL HYSTERECTOMY  1984   uterus & right ovary   TOTAL KNEE ARTHROPLASTY Left 04/23/2017   TOTAL KNEE ARTHROPLASTY Left 04/23/2017   Procedure: TOTAL KNEE ARTHROPLASTY;  Surgeon: Liam Lerner, MD;  Location: [facility] OR;  Service: Orthopedics;  Laterality: Left;   ULTRASOUND GUIDANCE FOR VASCULAR ACCESS  05/09/2017   Procedure: Ultrasound Guidance For Vascular Access;  Surgeon: Wonda Sharper, MD;  Location: [facility] INVASIVE CV LAB;  Service: Cardiovascular;;  Allergies  Allergen Reactions   Hydrocodone -Guaifenesin  Other (See Comments)    Confusion/ memory loss   Adhesive [Tape] Rash and Other (See Comments)    Allergic to defibrillation pads.    Immunization History  Administered Date(s) Administered   Fluad  Quad(high Dose 65+) 04/10/2019, 04/20/2021, 04/04/2022   Fluad  Trivalent(High Dose 65+) 04/23/2023   Influenza Split 04/18/2011, 05/06/2012   Influenza Whole 07/04/2007, 06/02/2009, 03/01/2010   Influenza, High Dose Seasonal PF 04/03/2014, 04/18/2016, 04/05/2017, 05/06/2018   Influenza,inj,Quad PF,6+ Mos 03/07/2013, 04/06/2015   PFIZER Comirnaty (Gray Top)Covid-19 Tri-Sucrose Vaccine 04/05/2022   PFIZER(Purple  Top)SARS-COV-2 Vaccination 07/22/2019, 08/10/2019, 03/29/2020   PNEUMOCOCCAL CONJUGATE-20 04/22/2021   Pfizer Covid-19 Vaccine Bivalent Booster 40yrs & up 04/22/2021   Pfizer(Comirnaty )Fall Seasonal Vaccine 12 years and older 04/23/2023   Pneumococcal Conjugate-13 09/24/2013, 04/20/2020   Pneumococcal Polysaccharide-23 07/03/2005, 07/04/2007, 04/18/2011, 04/05/2022   Respiratory Syncytial Virus Vaccine ,Recomb Aduvanted(Arexvy ) 04/05/2022   Td 06/02/2009   Zoster Recombinant(Shingrix) 04/09/2020, 10/24/2022   Zoster, Live 07/03/2006    Family History  Problem Relation Age of Onset   Suicidality Father        suicide death pt was 3 yrs, 70   Arrhythmia Mother    Hypertension Mother    Diabetes Mother    Dementia Mother    Heart attack Brother    Heart disease Paternal Aunt    Prostate cancer Maternal Grandfather    Diabetes Paternal Grandfather        fathers side of the family   Colon cancer Maternal Aunt      Current Outpatient Medications:    apixaban  (ELIQUIS ) 5 MG TABS tablet, Take 1 tablet (5 mg total) by mouth 2 (two) times daily., Disp: 180 tablet, Rfl: 1   atorvastatin  (LIPITOR) 40 MG tablet, Take 1 tablet (40 mg total) by mouth daily., Disp: 90 tablet, Rfl: 3   BD PEN NEEDLE NANO 2ND GEN 32G X 4 MM MISC, daily., Disp: , Rfl:    Continuous Glucose Receiver (DEXCOM G7 RECEIVER) DEVI, Use as directed while on insulin , Disp: 1 each, Rfl: 3   Continuous Glucose Sensor (DEXCOM G7 SENSOR) MISC, Apply 1 sensor to skin as directed to monitor blood sugars. Replace with new sensor every 10 days., Disp: 3 each, Rfl: 11   diltiazem  (CARDIZEM  LA) 360 MG 24 hr tablet, Take 1 tablet (360 mg total) by mouth daily., Disp: 90 tablet, Rfl: 3   dofetilide  (TIKOSYN ) 250 MCG capsule, Take 1 capsule (250 mcg total) by mouth 2 (two) times daily with breakfast and at bedtime., Disp: 180 capsule, Rfl: 3   DULoxetine  (CYMBALTA ) 60 MG capsule, Take 1 capsule (60 mg total) by mouth daily., Disp: 90  capsule, Rfl: 3   empagliflozin  (JARDIANCE ) 25 MG TABS tablet, Take 1 tablet (25 mg total) by mouth daily before breakfast., Disp: 90 tablet, Rfl: 0   ezetimibe  (ZETIA ) 10 MG tablet, Take 1 tablet (10 mg total) by mouth daily., Disp: 90 tablet, Rfl: 3   fluticasone  (FLONASE ) 50 MCG/ACT nasal spray, Place 1 spray into both nostrils daily as needed for allergies., Disp: , Rfl:    insulin  degludec (TRESIBA  FLEXTOUCH) 100 UNIT/ML FlexTouch Pen, Inject 28 Units into the skin daily as directed, Disp: 9 mL, Rfl: 3   ipratropium (ATROVENT ) 0.06 % nasal spray, Place 1 spray into both nostrils as needed for rhinitis., Disp: , Rfl:    loratadine  (CLARITIN ) 10 MG tablet, TAKE 1 TABLET BY MOUTH EVERY DAY, Disp: 90 tablet, Rfl: 0   metFORMIN  (GLUCOPHAGE ) 1000 MG tablet,  Take 1 tablet (1,000 mg total) by mouth 2 (two) times daily., Disp: 180 tablet, Rfl: 3   metolazone  (ZAROXOLYN ) 2.5 MG tablet, Take 1 tablet (2.5 mg total) by mouth as directed. Weekly as needed for weight gain of 3 lb in 24 hours, 5 lb in a week or leg swelling, Disp: 25 tablet, Rfl: 2   metoprolol  tartrate (LOPRESSOR ) 50 MG tablet, Take 1 tablet (50 mg total) by mouth every morning AND 2 tablets (100 mg total) every evening., Disp: 270 tablet, Rfl: 3   nitroGLYCERIN  (NITROSTAT ) 0.4 MG SL tablet, Place 1 tablet (0.4 mg total) under the tongue every 5 (five) minutes x 3 doses as needed for chest pain., Disp: 25 tablet, Rfl: 3   NON FORMULARY, CPAP at bedtime, Disp: , Rfl:    nystatin -triamcinolone  (MYCOLOG II) cream, Apply 1 application topically 2 (two) times daily as needed., Disp: 30 g, Rfl: 1   omeprazole  (PRILOSEC) 20 MG capsule, Take 1 capsule (20 mg total) by mouth 2 (two) times daily before breakfast and at bedtime., Disp: 60 capsule, Rfl: 3   ondansetron  (ZOFRAN -ODT) 4 MG disintegrating tablet, Take 1 tablet (4 mg total) by mouth every 8 (eight) hours as needed for nausea or vomiting., Disp: 20 tablet, Rfl: 1   Polyethyl Glycol-Propyl  Glycol (SYSTANE) 0.4-0.3 % SOLN, Place 1 drop into both eyes daily as needed (dry eyes)., Disp: , Rfl:    sodium zirconium cyclosilicate  (LOKELMA ) 10 g PACK packet, Take 1 dose if needed as directed by MARLA Mose NP, Disp: , Rfl:    spironolactone  (ALDACTONE ) 25 MG tablet, Take 0.5 tablets (12.5 mg total) by mouth daily., Disp: 45 tablet, Rfl: 2   tirzepatide  (MOUNJARO ) 15 MG/0.5ML Pen, Inject 15 mg into the skin once a week., Disp: 2 mL, Rfl: 11   torsemide  (DEMADEX ) 20 MG tablet, Take 3 tablets (60 mg total) by mouth daily before breakfast, Disp: 270 tablet, Rfl: 3   valACYclovir  (VALTREX ) 1000 MG tablet, TAKE 2 TABLETS BY MOUTH EVERY 12 HOURS AS NEEDED, Disp: 34 tablet, Rfl: 2  Current Facility-Administered Medications:    sodium chloride  flush (NS) 0.9 % injection 3 mL, 3 mL, Intravenous, Q12H, Wyn Jackee VEAR Mickey., NP      Objective:   Vitals:   01/24/24 1009  BP: 116/64  Pulse: 67  SpO2: 100%  Weight: 198 lb (89.8 kg)  Height: 5' 6 (1.676 m)    Estimated body mass index is 31.96 kg/m as calculated from the following:   Height as of this encounter: 5' 6 (1.676 m).   Weight as of this encounter: 198 lb (89.8 kg).  @WEIGHTCHANGE @  [employer]   01/24/24 1009  Weight: 198 lb (89.8 kg)     Physical Exam   General: No distress. Obese but imprve O2 at rest: yes but does not need Cane present: no Sitting in wheel chair: no Frail: no Obese: no Neuro: Alert and Oriented x 3. GCS 15. Speech normal Psych: Pleasant Resp:  Barrel Chest - no.  Wheeze - no, Crackles - mayb, No overt respiratory distress CVS: Normal heart sounds. Murmurs - no Ext: Stigmata of Connective Tissue Disease - no HEENT: Normal upper airway. PEERL +. No post nasal drip        Assessment/     Assessment & Plan ILD (interstitial lung disease) (HCC)  Obstructive sleep apnea  Pulmonary hypertensive venous disease (HCC)  Class 2 obesity    PLAN Patient Instructions     ICD-10-CM   1.  ILD  (interstitial lung disease) (HCC)  J84.9 CT Chest High Resolution      #Interstitial lung disease/hypersensitive pneumonitis  -I believe that he has some amount of mild ILD based on the upper lobe infiltrates, Aspergillus antibody positive but this is stable over time -In the past CCP antibody positive  Plan - Do HRCT supine and prone in 6 months as way to monitor ILD progession - - Took shared decision making not to do steroids or other immunosuppressants given stability and side effect profile of the medications   #Pulmonary hypertension -postcapillary in July 2023 but precapillary in October 2023 after diuresis   -Probably multifactorial associated with the mild ILD, obesity and sleep apnea  - RHC Feb 2025 close to normal  Plan   -Rx by CHF services Dr Rolan  #Chronic hypoxemic respiratory failure   - exercise (simple) 1 min sit stand test without desaturation  - room air pulse ox normal - this is normal and mproved with control of pulmonary hypertension and weight loss  Plan - MOnitor o2 use  - use only if pulse ox < 88%  Obesity  - weight 198# on 01/24/2024 and ongoing weight loss (intentional)  Plan  - weight loss to continue congratulation  #OSA  - maybe this is also improved  Plan  - please visit with Dr Neysa about night cPAP settings and night o2 use  FOllowup  - 6 months but after HRCT 15-minute visit   ( Level 05 visit E&M 2024: Estb >= 40 min  visit type: on-site physical face to visit  in total care time and counseling or/and coordination of care by this undersigned MD - Dr Dorethia Cave. This includes one or more of the following on this same day 01/24/2024: pre-charting, chart review, note writing, documentation discussion of test results, diagnostic or treatment recommendations, prognosis, risks and benefits of management options, instructions, education, compliance or risk-factor reduction. It excludes time spent by the CMA or office staff in the  care of the patient. Actual time 40 min)   FOLLOWUP    Return in about 6 months (around 07/26/2024) for 15 min visit, ILD, Face to Face Visit, after HRCT chest.    SIGNATURE    Dr. Dorethia Cave, M.D., F.C.C.P,  Pulmonary and Critical Care Medicine Staff Physician, [facility] Director - Interstitial Lung Disease  Program  Pulmonary Fibrosis [facility] [place], [place], [address]  Pager: [phone], If no answer or between  15:00h - 7:00h: call [phone] Telephone: [phone]  11:01 AM 01/24/2024

## 2024-01-24 NOTE — Patient Instructions (Signed)
Spiro/DLCO performed today. 

## 2024-01-25 ENCOUNTER — Telehealth (HOSPITAL_COMMUNITY): Payer: Self-pay

## 2024-01-25 ENCOUNTER — Other Ambulatory Visit (HOSPITAL_COMMUNITY): Payer: Self-pay

## 2024-01-25 NOTE — Telephone Encounter (Signed)
 Spoke with patient to discuss upcoming procedure.   Confirmed patient is scheduled for a PPM generator change on Friday, August 1 with Dr. Soyla Norton. Instructed patient to arrive at the Main Entrance A at [facility]: [address] [place], [place] [address] and check in at Admitting at 12:30 PM.   Labs completed  Any recent signs of acute illness or been started on antibiotics?  No Any new medications started? No Any medications to hold? Hold Mounjaro  for 1 week prior to the procedure- last dose on July 20.  -Hold Jardiance  for 3 days prior to the procedure- last dose on Monday, July 28.  -Hold Eliquis  for 2 days prior to your procedure- last dose will be July 29.  -Adjust Tresiba  Insulin : The day before your procedure take your usual morning dose, but only take  of your usual dinner/bedtime dose. The morning of your procedure you will only take  of your usual dose.  -Hold Torsemide , Spironolactone , Zaroxolyn  and Metformin  the morning of your procedure. Medication instructions:  On the morning of your procedure take your morning medications not discussed with a sip of water .  No eating or drinking after midnight prior to procedure.   The night before your procedure and the morning of your procedure, wash thoroughly with the CHG surgical soap from the neck down, paying special attention to the area where your procedure will be performed.  Advised of plan to go home the same day and will only stay overnight if medically necessary. You MUST have a responsible adult to drive you home and MUST be with you the first 24 hours after you arrive home.  Patient verbalized understanding to all instructions provided and agreed to proceed with procedure.

## 2024-01-26 ENCOUNTER — Other Ambulatory Visit (HOSPITAL_COMMUNITY): Payer: Self-pay

## 2024-01-28 ENCOUNTER — Telehealth: Payer: Self-pay

## 2024-01-28 NOTE — Telephone Encounter (Signed)
 Pt aware of time change for her PPM Gen change with Dr. Inocencio on 8/1 - she will arrive at [facility] at 230 for a 430 pm procedure.  I informed her that she could have a light breakfast before 8 am.

## 2024-01-29 ENCOUNTER — Inpatient Hospital Stay (HOSPITAL_COMMUNITY)
Admission: EM | Admit: 2024-01-29 | Discharge: 2024-01-31 | DRG: 259 | Disposition: A | Discharge status: Home / Self Care | Attending: Internal Medicine | Admitting: Internal Medicine

## 2024-01-29 ENCOUNTER — Emergency Department (HOSPITAL_COMMUNITY)

## 2024-01-29 ENCOUNTER — Encounter (HOSPITAL_COMMUNITY): Payer: Self-pay

## 2024-01-29 ENCOUNTER — Other Ambulatory Visit: Payer: Self-pay

## 2024-01-29 DIAGNOSIS — I517 Cardiomegaly: Secondary | ICD-10-CM | POA: Diagnosis not present

## 2024-01-29 DIAGNOSIS — G4733 Obstructive sleep apnea (adult) (pediatric): Secondary | ICD-10-CM | POA: Diagnosis present

## 2024-01-29 DIAGNOSIS — I272 Pulmonary hypertension, unspecified: Secondary | ICD-10-CM | POA: Diagnosis present

## 2024-01-29 DIAGNOSIS — Z8673 Personal history of transient ischemic attack (TIA), and cerebral infarction without residual deficits: Secondary | ICD-10-CM

## 2024-01-29 DIAGNOSIS — J9610 Chronic respiratory failure, unspecified whether with hypoxia or hypercapnia: Secondary | ICD-10-CM | POA: Diagnosis not present

## 2024-01-29 DIAGNOSIS — D72829 Elevated white blood cell count, unspecified: Secondary | ICD-10-CM | POA: Diagnosis not present

## 2024-01-29 DIAGNOSIS — Y831 Surgical operation with implant of artificial internal device as the cause of abnormal reaction of the patient, or of later complication, without mention of misadventure at the time of the procedure: Secondary | ICD-10-CM | POA: Diagnosis present

## 2024-01-29 DIAGNOSIS — I130 Hypertensive heart and chronic kidney disease with heart failure and stage 1 through stage 4 chronic kidney disease, or unspecified chronic kidney disease: Secondary | ICD-10-CM | POA: Diagnosis not present

## 2024-01-29 DIAGNOSIS — Z833 Family history of diabetes mellitus: Secondary | ICD-10-CM

## 2024-01-29 DIAGNOSIS — J849 Interstitial pulmonary disease, unspecified: Secondary | ICD-10-CM | POA: Diagnosis not present

## 2024-01-29 DIAGNOSIS — I252 Old myocardial infarction: Secondary | ICD-10-CM | POA: Diagnosis not present

## 2024-01-29 DIAGNOSIS — Z91048 Other nonmedicinal substance allergy status: Secondary | ICD-10-CM

## 2024-01-29 DIAGNOSIS — N1831 Chronic kidney disease, stage 3a: Secondary | ICD-10-CM | POA: Diagnosis not present

## 2024-01-29 DIAGNOSIS — I4892 Unspecified atrial flutter: Secondary | ICD-10-CM | POA: Diagnosis not present

## 2024-01-29 DIAGNOSIS — Z7901 Long term (current) use of anticoagulants: Secondary | ICD-10-CM | POA: Diagnosis not present

## 2024-01-29 DIAGNOSIS — Z7985 Long-term (current) use of injectable non-insulin antidiabetic drugs: Secondary | ICD-10-CM

## 2024-01-29 DIAGNOSIS — E66811 Obesity, class 1: Secondary | ICD-10-CM | POA: Diagnosis present

## 2024-01-29 DIAGNOSIS — Z950 Presence of cardiac pacemaker: Secondary | ICD-10-CM | POA: Diagnosis not present

## 2024-01-29 DIAGNOSIS — F32A Depression, unspecified: Secondary | ICD-10-CM | POA: Diagnosis present

## 2024-01-29 DIAGNOSIS — R001 Bradycardia, unspecified: Principal | ICD-10-CM | POA: Diagnosis present

## 2024-01-29 DIAGNOSIS — Z45010 Encounter for checking and testing of cardiac pacemaker pulse generator [battery]: Secondary | ICD-10-CM | POA: Diagnosis not present

## 2024-01-29 DIAGNOSIS — I495 Sick sinus syndrome: Secondary | ICD-10-CM

## 2024-01-29 DIAGNOSIS — Z87891 Personal history of nicotine dependence: Secondary | ICD-10-CM

## 2024-01-29 DIAGNOSIS — R0602 Shortness of breath: Secondary | ICD-10-CM | POA: Diagnosis not present

## 2024-01-29 DIAGNOSIS — Z7984 Long term (current) use of oral hypoglycemic drugs: Secondary | ICD-10-CM | POA: Diagnosis not present

## 2024-01-29 DIAGNOSIS — I2510 Atherosclerotic heart disease of native coronary artery without angina pectoris: Secondary | ICD-10-CM | POA: Diagnosis present

## 2024-01-29 DIAGNOSIS — E1122 Type 2 diabetes mellitus with diabetic chronic kidney disease: Secondary | ICD-10-CM | POA: Diagnosis not present

## 2024-01-29 DIAGNOSIS — E785 Hyperlipidemia, unspecified: Secondary | ICD-10-CM | POA: Diagnosis not present

## 2024-01-29 DIAGNOSIS — Z955 Presence of coronary angioplasty implant and graft: Secondary | ICD-10-CM

## 2024-01-29 DIAGNOSIS — Z885 Allergy status to narcotic agent status: Secondary | ICD-10-CM

## 2024-01-29 DIAGNOSIS — T82111A Breakdown (mechanical) of cardiac pulse generator (battery), initial encounter: Principal | ICD-10-CM | POA: Diagnosis present

## 2024-01-29 DIAGNOSIS — Z8249 Family history of ischemic heart disease and other diseases of the circulatory system: Secondary | ICD-10-CM

## 2024-01-29 DIAGNOSIS — Z794 Long term (current) use of insulin: Secondary | ICD-10-CM

## 2024-01-29 DIAGNOSIS — Z800 Family history of malignant neoplasm of digestive organs: Secondary | ICD-10-CM

## 2024-01-29 DIAGNOSIS — I480 Paroxysmal atrial fibrillation: Secondary | ICD-10-CM | POA: Diagnosis present

## 2024-01-29 DIAGNOSIS — Z6831 Body mass index (BMI) 31.0-31.9, adult: Secondary | ICD-10-CM

## 2024-01-29 DIAGNOSIS — Z85828 Personal history of other malignant neoplasm of skin: Secondary | ICD-10-CM

## 2024-01-29 DIAGNOSIS — I5032 Chronic diastolic (congestive) heart failure: Secondary | ICD-10-CM | POA: Diagnosis present

## 2024-01-29 DIAGNOSIS — Z9981 Dependence on supplemental oxygen: Secondary | ICD-10-CM

## 2024-01-29 DIAGNOSIS — Z79899 Other long term (current) drug therapy: Secondary | ICD-10-CM

## 2024-01-29 DIAGNOSIS — I4891 Unspecified atrial fibrillation: Secondary | ICD-10-CM | POA: Diagnosis not present

## 2024-01-29 DIAGNOSIS — Z96652 Presence of left artificial knee joint: Secondary | ICD-10-CM | POA: Diagnosis present

## 2024-01-29 DIAGNOSIS — Z820 Family history of epilepsy and other diseases of the nervous system: Secondary | ICD-10-CM

## 2024-01-29 LAB — CBC WITH DIFFERENTIAL/PLATELET
Abs Immature Granulocytes: 0.05 K/uL (ref 0.00–0.07)
Basophils Absolute: 0.1 K/uL (ref 0.0–0.1)
Basophils Relative: 1 %
Eosinophils Absolute: 0.3 K/uL (ref 0.0–0.5)
Eosinophils Relative: 3 %
HCT: 41.8 % (ref 36.0–46.0)
Hemoglobin: 13.4 g/dL (ref 12.0–15.0)
Immature Granulocytes: 0 %
Lymphocytes Relative: 26 %
Lymphs Abs: 2.9 K/uL (ref 0.7–4.0)
MCH: 28.9 pg (ref 26.0–34.0)
MCHC: 32.1 g/dL (ref 30.0–36.0)
MCV: 90.3 fL (ref 80.0–100.0)
Monocytes Absolute: 1.1 K/uL — ABNORMAL HIGH (ref 0.1–1.0)
Monocytes Relative: 9 %
Neutro Abs: 6.9 K/uL (ref 1.7–7.7)
Neutrophils Relative %: 61 %
Platelets: 345 K/uL (ref 150–400)
RBC: 4.63 MIL/uL (ref 3.87–5.11)
RDW: 15.9 % — ABNORMAL HIGH (ref 11.5–15.5)
WBC: 11.3 K/uL — ABNORMAL HIGH (ref 4.0–10.5)
nRBC: 0 % (ref 0.0–0.2)

## 2024-01-29 LAB — MAGNESIUM: Magnesium: 2.2 mg/dL (ref 1.7–2.4)

## 2024-01-29 LAB — BASIC METABOLIC PANEL WITH GFR
Anion gap: 14 (ref 5–15)
BUN: 29 mg/dL — ABNORMAL HIGH (ref 8–23)
CO2: 23 mmol/L (ref 22–32)
Calcium: 9.4 mg/dL (ref 8.9–10.3)
Chloride: 96 mmol/L — ABNORMAL LOW (ref 98–111)
Creatinine, Ser: 1.4 mg/dL — ABNORMAL HIGH (ref 0.44–1.00)
GFR, Estimated: 39 mL/min — ABNORMAL LOW (ref >60)
Glucose, Bld: 119 mg/dL — ABNORMAL HIGH (ref 70–99)
Potassium: 4.7 mmol/L (ref 3.5–5.1)
Sodium: 133 mmol/L — ABNORMAL LOW (ref 135–145)

## 2024-01-29 LAB — TROPONIN I (HIGH SENSITIVITY): Troponin I (High Sensitivity): 8 ng/L (ref <18)

## 2024-01-29 MED ORDER — DOFETILIDE 250 MCG PO CAPS
250.0000 ug | ORAL_CAPSULE | Freq: Two times a day (BID) | ORAL | Status: DC
  Administered 2024-01-29 – 2024-01-31 (×4): 250 ug via ORAL
  Filled 2024-01-29 (×6): qty 1

## 2024-01-29 MED ORDER — HEPARIN (PORCINE) 25000 UT/250ML-% IV SOLN
1300.0000 [IU]/h | INTRAVENOUS | Status: DC
  Administered 2024-01-29 – 2024-01-30 (×2): 1100 [IU]/h via INTRAVENOUS
  Filled 2024-01-29: qty 250

## 2024-01-29 NOTE — ED Provider Notes (Signed)
 [facility] EMERGENCY DEPARTMENT AT [facility] Provider Note   CSN: [id number] Arrival date & time: 01/29/24  1916     Patient presents with: Bradycardia and Pacemaker Problem   [name] is a 78 y.o. female.   This is a 78 year old female presenting emergency department for lightheadedness.  Reports pacemaker battery is dead and due for change February 13, 2024.  Today she noted that she had some lightheadedness while she was driving and felt her heart rate dropped into the 30s.  Associated chest pressure and weakness.  Heart rate improved currently on my inner interview up into the 60s and she notes that she feels much improved.        Prior to Admission medications   Medication Sig Start Date End Date Taking? Authorizing Provider  apixaban  (ELIQUIS ) 5 MG TABS tablet Take 1 tablet (5 mg total) by mouth 2 (two) times daily. 08/16/23   Chandrasekhar, Stanly LABOR, MD  atorvastatin  (LIPITOR) 40 MG tablet Take 1 tablet (40 mg total) by mouth daily. 08/16/23   Santo Stanly LABOR, MD  BD PEN NEEDLE NANO 2ND GEN 32G X 4 MM MISC daily. 02/07/22   [provider]  Continuous Glucose Receiver (DEXCOM G7 RECEIVER) DEVI Use as directed while on insulin  01/10/23   [name], Wanda K, MD  Continuous Glucose Sensor (DEXCOM G7 SENSOR) MISC Apply 1 sensor to skin as directed to monitor blood sugars. Replace with new sensor every 10 days. 05/07/23   [name], Apolinar POUR, MD  diltiazem  (CARDIZEM  LA) 360 MG 24 hr tablet Take 1 tablet (360 mg total) by mouth daily. 06/05/23   Santo Stanly LABOR, MD  dofetilide  (TIKOSYN ) 250 MCG capsule Take 1 capsule (250 mcg total) by mouth 2 (two) times daily with breakfast and at bedtime. 05/24/23   Nahser, Aleene PARAS, MD  DULoxetine  (CYMBALTA ) 60 MG capsule Take 1 capsule (60 mg total) by mouth daily. 02/28/23   [name], Apolinar POUR, MD  empagliflozin  (JARDIANCE ) 25 MG TABS tablet Take 1 tablet (25 mg total) by mouth daily before breakfast. 12/27/23   [name], Wanda K, MD   ezetimibe  (ZETIA ) 10 MG tablet Take 1 tablet (10 mg total) by mouth daily. 06/05/23   Santo Stanly LABOR, MD  fluticasone  (FLONASE ) 50 MCG/ACT nasal spray Place 1 spray into both nostrils daily as needed for allergies.    [provider]  insulin  degludec (TRESIBA  FLEXTOUCH) 100 UNIT/ML FlexTouch Pen Inject 28 Units into the skin daily as directed 08/17/23   [name], Wanda K, MD  ipratropium (ATROVENT ) 0.06 % nasal spray Place 1 spray into both nostrils as needed for rhinitis.    [provider]  loratadine  (CLARITIN ) 10 MG tablet TAKE 1 TABLET BY MOUTH EVERY DAY Patient taking differently: Take 10 mg by mouth daily as needed for allergies. 04/28/21   [name], Wanda K, MD  metFORMIN  (GLUCOPHAGE ) 1000 MG tablet Take 1 tablet (1,000 mg total) by mouth 2 (two) times daily. 08/16/23   Webb, Padonda B, FNP  metolazone  (ZAROXOLYN ) 2.5 MG tablet Take 1 tablet (2.5 mg total) by mouth as directed. Weekly as needed for weight gain of 3 lb in 24 hours, 5 lb in a week or leg swelling 09/26/23   Rolan Ezra RAMAN, MD  metoprolol  tartrate (LOPRESSOR ) 50 MG tablet Take 1 tablet (50 mg total) by mouth every morning AND 2 tablets (100 mg total) every evening. 05/24/23   Nahser, Aleene PARAS, MD  nitroGLYCERIN  (NITROSTAT ) 0.4 MG SL tablet Place 1 tablet (0.4 mg  total) under the tongue every 5 (five) minutes x 3 doses as needed for chest pain. 05/14/23   Chandrasekhar, Stanly LABOR, MD  NON FORMULARY CPAP at bedtime    [provider]  nystatin -triamcinolone  (MYCOLOG II) cream Apply 1 application topically 2 (two) times daily as needed. 08/23/21   [name], Wanda K, MD  omeprazole  (PRILOSEC) 20 MG capsule Take 1 capsule (20 mg total) by mouth 2 (two) times daily before breakfast and at bedtime. 01/23/24   [name], Apolinar POUR, MD  ondansetron  (ZOFRAN -ODT) 4 MG disintegrating tablet Take 1 tablet (4 mg total) by mouth every 8 (eight) hours as needed for nausea or vomiting. 02/28/23   [name], Wanda K, MD   Polyethyl Glycol-Propyl Glycol (SYSTANE) 0.4-0.3 % SOLN Place 1 drop into both eyes daily as needed (dry eyes).    [provider]  spironolactone  (ALDACTONE ) 25 MG tablet Take 0.5 tablets (12.5 mg total) by mouth daily. 08/16/23   Santo Stanly LABOR, MD  tirzepatide  (MOUNJARO ) 15 MG/0.5ML Pen Inject 15 mg into the skin once a week. 05/16/23   Santo Stanly LABOR, MD  torsemide  (DEMADEX ) 20 MG tablet Take 3 tablets (60 mg total) by mouth daily before breakfast 06/08/23   Nahser, Aleene PARAS, MD  valACYclovir  (VALTREX ) 1000 MG tablet TAKE 2 TABLETS BY MOUTH EVERY 12 HOURS AS NEEDED 02/28/23   [name], Wanda K, MD    Allergies: Hydrocodone -guaifenesin  and Adhesive [tape]    Review of Systems  Updated Vital Signs BP (!) 128/51 (BP Location: Left Arm)   Pulse (!) 39   Temp 98.7 F (37.1 C) (Oral)   Resp (!) 22   SpO2 99%   Physical Exam Vitals and nursing note reviewed.  Constitutional:      General: She is not in acute distress.    Appearance: She is not toxic-appearing.  HENT:     Head: Normocephalic and atraumatic.     Nose: Nose normal.     Mouth/Throat:     Mouth: Mucous membranes are moist.  Eyes:     Conjunctiva/sclera: Conjunctivae normal.  Cardiovascular:     Rate and Rhythm: Normal rate and regular rhythm.  Pulmonary:     Effort: Pulmonary effort is normal.     Breath sounds: Normal breath sounds.  Abdominal:     General: Abdomen is flat. There is no distension.     Palpations: Abdomen is soft.     Tenderness: There is no abdominal tenderness. There is no guarding or rebound.  Musculoskeletal:     Right lower leg: Edema present.     Left lower leg: Edema present.  Skin:    General: Skin is warm.     Capillary Refill: Capillary refill takes less than 2 seconds.  Neurological:     Mental Status: She is alert.  Psychiatric:        Mood and Affect: Mood normal.        Behavior: Behavior normal.     (all labs ordered are listed, but only abnormal  results are displayed) Labs Reviewed  CBC WITH DIFFERENTIAL/PLATELET - Abnormal; Notable for the following components:      Result Value   WBC 11.3 (*)    RDW 15.9 (*)    Monocytes Absolute 1.1 (*)    All other components within normal limits  BASIC METABOLIC PANEL WITH GFR - Abnormal; Notable for the following components:   Sodium 133 (*)    Chloride 96 (*)    Glucose, Bld 119 (*)  BUN 29 (*)    Creatinine, Ser 1.40 (*)    GFR, Estimated 39 (*)    All other components within normal limits  MAGNESIUM   TROPONIN I (HIGH SENSITIVITY)  TROPONIN I (HIGH SENSITIVITY)    EKG: EKG Interpretation Date/Time:  Tuesday January 29 2024 19:26:04 EDT Ventricular Rate:  56 PR Interval:  308 QRS Duration:  78 QT Interval:  458 QTC Calculation: 441 R Axis:   84  Text Interpretation: Sinus bradycardia with marked sinus arrhythmia with 1st degree A-V block with occasional ventricular-paced complexes Abnormal ECG When compared with ECG of 02-Jan-2024 13:46, PREVIOUS ECG IS PRESENT Confirmed by Neysa Clap [phone]) on 01/29/2024 8:18:15 PM  Radiology: [facility] Chest 2 View Result Date: 01/29/2024 CLINICAL DATA:  Shortness of breath EXAM: CHEST - 2 VIEW COMPARISON:  Chest x-ray 04/18/2022 FINDINGS: Left-sided pacemaker is again seen. The heart is mildly enlarged. Lungs are clear. There is no pleural effusion or pneumothorax. No acute fractures are seen. A IMPRESSION: Mild cardiomegaly. No acute cardiopulmonary process. Electronically Signed   By: Greig Pique M.D.   On: 01/29/2024 20:56     Procedures   Medications Ordered in the ED - No data to display  Clinical Course as of 01/29/24 2214  Tue Jan 29, 2024  2029 Saw cardiolgoy on 7/15: Tachy-Brady syndrome s/p Abbott PPM  Device unable to communicate today.  Likely EOS given length of time since last trasmission Gen change scheduled for 8/1 with Dr. Inocencio   Alarm symptoms reviewed   Explained risks, benefits, and alternatives to generator  change implantation, including but not limited to bleeding and infection. Pt verbalized understanding and agrees to proceed.    OAC held  [TY]  2029 Case discussed with on-call cardiologist, will evaluate patient and make further recommendations [TY]  2123 Spoke with cardiology recommending Obs overnight on telemetry with cards/EP consult in AM.  [TY]  2213 Mild leukocytosis.  No fever or tachycardia.  Chest x-ray without pneumonia.  Physical metabolic panel with mild low potassium and chloride.  Appears to be at baseline kidney function.  Troponin negative.  ACS less likely.  Mag normal. [TY]  2214  paged out to hospitalist for admission. [TY]    Clinical Course User Index [TY] Neysa Clap PARAS, DO                                 Medical Decision Making Is a 78 year old female tachybradycardia syndrome with pacemaker due for change August 1, also complicated history of CAD, hypertension, A-fib, CHF, TIA, pulmonary hypertension, chronic oxygen  use at 3 L.  Has documented heart rates in triage down to 39 bpm.  Currently appears to be sinus rhythm with occasional PVC with a rate in the low 60s.  Blood pressure 128/51.  Does not appear to be in distress.  She is maintaining her oxygen  saturation on her home 3 L.  Labs with minor leukocytosis, normal magnesium  level.  EKG appears to have second-degree type block with intermittent captured beats.  No ischemic changes.  Case discussed with cardiology, see ED course for further MDM disposition.    Amount and/or Complexity of Data Reviewed Independent Historian:     Details: Has been notes that patient's heart rate down to the 30s and also a bit better he is due for check External Data Reviewed:     Details: See ED course Labs: ordered. Decision-making details documented in ED Course. Radiology: independent  interpretation performed. Decision-making details documented in ED Course. ECG/medicine tests: ordered and independent interpretation  performed.    Details: See above Discussion of management or test interpretation with external provider(s): Cardiology  Risk Decision regarding hospitalization.       Final diagnoses:  None    ED Discharge Orders     None          Neysa Caron PARAS, DO 01/29/24 2214

## 2024-01-29 NOTE — ED Notes (Signed)
 Pt hooked up to EKG which initially had heart rate 34 and then went up to 52. Pt due to have pacemaker replaced on Friday this week. No sx at this time

## 2024-01-29 NOTE — Progress Notes (Signed)
 PHARMACY - ANTICOAGULATION CONSULT NOTE  Pharmacy Consult for heparin  transition from Eliquis  Indication: atrial fibrillation  Allergies  Allergen Reactions   Hydrocodone -Guaifenesin  Other (See Comments)    Confusion/ memory loss   Adhesive [Tape] Rash and Other (See Comments)    Allergic to defibrillation pads.    Patient Measurements:    Vital Signs: Temp: 98.7 F (37.1 C) (07/29 1930) Temp Source: Oral (07/29 1930) BP: 132/50 (07/29 2215) Pulse Rate: 69 (07/29 2215)  Labs: Recent Labs    01/29/24 1944  HGB 13.4  HCT 41.8  PLT 345  CREATININE 1.40*  TROPONINIHS 8    Estimated Creatinine Clearance: 38 mL/min (A) (by C-G formula based on SCr of 1.4 mg/dL (H)).   Medical History: Past Medical History:  Diagnosis Date   Anticoagulant long-term use    pradaxa    Anxiety    Arthritis    fingers, lower back (04/23/2017    CAD (coronary artery disease) 8010,7996   March 1984; post PTCA with bare-metal stenting to mid RCA in December 2004   CHF (congestive heart failure) (HCC)    Chronic atrial fibrillation (HCC) 06/2007   Tachybradycardia pacemaker   Chronic kidney disease    10% function - ?R, other kidney is compensating     CVA (cerebral vascular accident) [facility]) [phone]   denies residual on 04/23/2017   Depression    Diplopia 06/19/2008   Qualifier: Diagnosis of  By: Charlett MD, Apolinar POUR    Dysrhythmia    ATRIAL FIBRILATION   Edema of lower extremity    Hyperlipidemia    Hypertension    1981   Inferior myocardial infarction [facility])    acute inferior wall mi/other medical hx   Myocardial infarction (HCC)    1984, [id number]   Obesity    OSA on CPAP    last test- 2010   Pacemaker    Pneumonia 2014   tx. ----  [facility]   Pulmonary hypertension (HCC)    moderate pulmonary hypertension by 10/2016 echo and 10/2013 cardiac cath   Shortness of breath    Skin cancer    cut off right Madej; burned off LLE (04/23/2017)   Sleep apnea 1981    Spondylolisthesis    TIA (transient ischemic attack) 2008   Unspecified hemorrhoids without mention of complication 07/15/2003   Colonoscopy--Dr. Avram     Medications:  Eliquis  5mg  PO BID PTA  Assessment: 77 YOF w/ AF on eliquis  PTA, last dose 0830 this AM per pt. Transition to heparin  for planned pacemaker change-out surgery, currently scheduled for 02/01/24. HGB 13.4, PLT 345. Will therapeutically monitor heparin  via aPTT due to pt's home DOAC.  Goal of Therapy:  aPTT 66-102 seconds Anti-Xa 0.3-0.7 Monitor platelets by anticoagulation protocol: Yes   Plan:  Start heparin  infusion at 1100 units/hr, no bolus Check aPTT in 8 hours and daily while on heparin  Check anti-Xa level in 8 hours and daily while on heparin  Continue to monitor H&H and platelets    Belvie Macintosh, PharmD Candidate 01/29/2024,10:31 PM

## 2024-01-29 NOTE — ED Notes (Signed)
 Cards at bedside

## 2024-01-29 NOTE — ED Provider Triage Note (Signed)
 Emergency Medicine Provider Triage Evaluation Note  [name] , a 78 y.o. female  was evaluated in triage.  Pt complains of pacemaker problems.  Patient states that she has her pacemaker battery scheduled to be changed on Friday, reports that starting at 11 AM today her heart rate has intermittently dropped down into the 40s.  She appreciates intermittent chest heaviness, dizziness, weakness, fatigue.  Patient is normally on 3 L of oxygen  at home and is currently.  Denies fever, chills, nausea, vomiting, abdominal pain.  Denies chest pain at this time.  Review of Systems  Positive: Bradycardia, dizziness, fatigue, chest tightness Negative: Nausea, vomiting, abdominal pain, fever, chills  Physical Exam  BP (!) 128/51 (BP Location: Left Arm)   Pulse (!) 39   Temp 98.7 F (37.1 C) (Oral)   Resp (!) 22   SpO2 99%  Gen:   Awake, no distress   Resp:  Normal effort  MSK:   Moves extremities without difficulty  Other:  Patient on baseline oxygen  of 3 L which was recently decreased down from 4 by her PCP, talking in full sentences, alert and oriented, denies chest pain at this time, most recent vitals show heart rate of 39, patient talking in full sentences on room air, grossly neurologically intact  Medical Decision Making  Medically screening exam initiated at 7:44 PM.  Appropriate orders placed.  [name] was informed that the remainder of the evaluation will be completed by another provider, this initial triage assessment does not replace that evaluation, and the importance of remaining in the ED until their evaluation is complete.  Orders: CBC, BMP, EKG, chest x-ray, troponin   Janetta Terrall FALCON, [place] 01/29/24 1947

## 2024-01-29 NOTE — ED Notes (Signed)
 Ccmd called

## 2024-01-29 NOTE — ED Triage Notes (Signed)
 Pt coming in for concerns of pacemaker not working problems (due to have battery changed within next week). Pt reports starting at 11am today that her HR will intermittently drop down to 40s. Pt reports intermittent chest heaviness (none at this time), dizziness, weakness, fatigue. On 3L home O2 currently, baseline is 4 liters. No acute respiratory distress noted.

## 2024-01-29 NOTE — Consult Note (Signed)
 Cardiology Consultation   Patient ID: [name] MRN: [id number]; DOB: 1946/06/17  Admit date: 01/29/2024 Date of Consult: 01/29/2024  PCP:  Charlett Apolinar POUR, MD   [facility] Providers Cardiologist:  Stanly DELENA Leavens, MD  Electrophysiologist:  Soyla Gladis Norton, MD     Patient Profile: [name] is a 78 y.o. female with a hx of ILD on 3L O2, CAD (remote PCI to RCA), tachy-brady s/p Abbot PPM, AFib, CKD (III), HTN, HLD, HFpEF, OSA w/CPAP who is being seen 01/29/2024 for the evaluation of lightheadedness.  History of Present Illness: [name] reports that this morning at around 10AM, she started to have lightheadedness that lasted for about 2 hours. She reports associated fluttery sensation in her chest. No chest pain, SOB, leg swelling, or syncope. She checked her BP at home, which showed systolic of 116 and HR was consistently in the 40s on her checks. She's scheduled to undergo generator change of her PPM on 8/1, so she presented to the ED for concern that this needs to be done sooner.   In the ED, her HR was reportedly 38 in triage, although review of telemetry shows sinus bradycardia in the 50s with frequent V-paced beats.   I tired to interrogate the device; however, it was not able to communicate.   Past Medical History:  Diagnosis Date   Anticoagulant long-term use    pradaxa    Anxiety    Arthritis    fingers, lower back (04/23/2017    CAD (coronary artery disease) 8010,7996   March 1984; post PTCA with bare-metal stenting to mid RCA in December 2004   CHF (congestive heart failure) (HCC)    Chronic atrial fibrillation (HCC) 06/2007   Tachybradycardia pacemaker   Chronic kidney disease    10% function - ?R, other kidney is compensating     CVA (cerebral vascular accident) [facility]) [id number]   denies residual on 04/23/2017   Depression    Diplopia 06/19/2008   Qualifier: Diagnosis of  By: Charlett MD, Apolinar POUR    Dysrhythmia    ATRIAL FIBRILATION    Edema of lower extremity    Hyperlipidemia    Hypertension    1981   Inferior myocardial infarction [facility])    acute inferior wall mi/other medical hx   Myocardial infarction (HCC)    1984, [id number]   Obesity    OSA on CPAP    last test- 2010   Pacemaker    Pneumonia 2014   tx. ----  [facility]   Pulmonary hypertension (HCC)    moderate pulmonary hypertension by 10/2016 echo and 10/2013 cardiac cath   Shortness of breath    Skin cancer    cut off right Rinker; burned off LLE (04/23/2017)   Sleep apnea 1981   Spondylolisthesis    TIA (transient ischemic attack) 2008   Unspecified hemorrhoids without mention of complication 07/15/2003   Colonoscopy--Dr. Avram     Past Surgical History:  Procedure Laterality Date   ABDOMINAL HYSTERECTOMY     APPENDECTOMY  1984   ATRIAL FIBRILLATION ABLATION N/A 09/29/2016   Procedure: Atrial Fibrillation Ablation;  Surgeon: Will Gladis Norton, MD;  [facility]: [facility] INVAS[facility] CV LAB;  Service: Cardiovascular;  Laterality: N/A;   ATRIAL FIBRILLATION ABLATION N/A 02/07/2018   Procedure: ATRIAL FIBRILLATION ABLATION;  Surgeon: Norton Soyla Gladis, MD;  Location: [facility] INVASIVE CV LAB;  Service: Cardiovascular;  Laterality: N/A;   ATRIAL FIBRILLATION ABLATION N/A 03/13/2019   Procedure: ATRIAL FIBRILLATION ABLATION;  Surgeon: Norton,  Soyla Lunger, MD;  [facility]: [facility]VAS[facility] LAB;  Service: Cardiovascular;  Laterality: N/A;   ATRIAL FIBRILLATION ABLATION N/A 07/07/2020   Procedure: ATRIAL FIBRILLATION ABLATION;  Surgeon: Inocencio Soyla Lunger, MD;  [facility]: [facility];  Service: Cardiovascular;  Laterality: N/A;   BACK SURGERY     CARDIOVERSION N/A 09/12/2017   Procedure: CARDIOVERSION;  Surgeon: Jeffrie Oneil BROCKS, MD;  Location: [facility] ENDOSCOPY;  Service: Cardiovascular;  Laterality: N/A;   CARDIOVERSION N/A 12/13/2017   Procedure: CARDIOVERSION;  Surgeon: Francyne Headland, MD;  Location: [facility] ENDOSCOPY;  Service: Cardiovascular;  Laterality: N/A;    CARDIOVERSION N/A 02/18/2018   Procedure: CARDIOVERSION;  Surgeon: Maranda Leim DEL, MD;  Location: [facility] ENDOSCOPY;  Service: Cardiovascular;  Laterality: N/A;   CARDIOVERSION N/A 05/20/2018   Procedure: CARDIOVERSION;  Surgeon: Shlomo Wilbert SAUNDERS, MD;  Location: [facility] ENDOSCOPY;  Service: Cardiovascular;  Laterality: N/A;   CARDIOVERSION N/A 04/16/2019   Procedure: CARDIOVERSION;  Surgeon: Kate Lonni CROME, MD;  Location: [facility] ENDOSCOPY;  Service: Endoscopy;  Laterality: N/A;   CARDIOVERSION N/A 01/22/2020   Procedure: CARDIOVERSION;  Surgeon: Barbaraann Darryle Ned, MD;  Location: [facility] ENDOSCOPY;  Service: Cardiovascular;  Laterality: N/A;   CARDIOVERSION N/A 02/18/2020   Procedure: CARDIOVERSION;  Surgeon: Loni Soyla LABOR, MD;  Location: [facility] ENDOSCOPY;  Service: Cardiovascular;  Laterality: N/A;   CATARACT EXTRACTION W/ INTRAOCULAR LENS  IMPLANT, BILATERAL Bilateral 01/15/2017- 03/2017   CHOLECYSTECTOMY     CORONARY ANGIOPLASTY  X 2   CORONARY ANGIOPLASTY WITH STENT PLACEMENT  1998; ~ 2007; ?date   1 stent; replaced stent; not sure when I got the last stent (04/23/2017)   DOPPLER ECHOCARDIOGRAPHY  2009   ELECTROPHYSIOLOGIC STUDY N/A 03/31/2016   Procedure: Cardioversion;  Surgeon: Danelle LELON Birmingham, MD;  [facility];  Service: Cardiovascular;  Laterality: N/A;   ELECTROPHYSIOLOGIC STUDY N/A 08/04/2016   Procedure: Cardioversion;  Surgeon: Danelle LELON Birmingham, MD;  Location: [facility] INVASIVE CV LAB;  Service: Cardiovascular;  Laterality: N/A;   INSERT / REPLACE / REMOVE PACEMAKER  06/2007   IR RADIOLOGY PERIPHERAL GUIDED IV START  01/31/2018   IR US  GUIDE VASC ACCESS LEFT  01/31/2018   JOINT REPLACEMENT     LAPAROSCOPIC CHOLECYSTECTOMY  1994   LEFT AND RIGHT HEART CATHETERIZATION WITH CORONARY ANGIOGRAM N/A 10/06/2013   Procedure: LEFT AND RIGHT HEART CATHETERIZATION WITH CORONARY ANGIOGRAM;  Surgeon: Ned LABOR Sor, MD;  Location: [facility] CATH LAB;  Service: Cardiovascular;  Laterality: N/A;   LEFT  OOPHORECTOMY Left ~ 1989   POSTERIOR LUMBAR FUSION  2000s - 04/2015 X 3   L3-4; L4-5; L2-3; Dr Malcolm   RIGHT HEART CATH N/A 01/04/2022   Procedure: RIGHT HEART CATH;  Surgeon: Rolan Ezra RAMAN, MD;  Location: [facility] INVASIVE CV LAB;  Service: Cardiovascular;  Laterality: N/A;   RIGHT HEART CATH N/A 05/02/2022   Procedure: RIGHT HEART CATH;  Surgeon: Rolan Ezra RAMAN, MD;  Location: [facility] INVASIVE CV LAB;  Service: Cardiovascular;  Laterality: N/A;   RIGHT HEART CATH N/A 08/21/2023   Procedure: RIGHT HEART CATH;  Surgeon: Rolan Ezra RAMAN, MD;  Location: [facility] INVASIVE CV LAB;  Service: Cardiovascular;  Laterality: N/A;   RIGHT/LEFT HEART CATH AND CORONARY ANGIOGRAPHY N/A 05/09/2017   Procedure: RIGHT/LEFT HEART CATH AND CORONARY ANGIOGRAPHY;  Surgeon: Wonda Sharper, MD;  Location: [facility] INVASIVE CV LAB;  Service: Cardiovascular;  Laterality: N/A;   SKIN CANCER EXCISION Right    [place]   TEE WITHOUT CARDIOVERSION N/A 09/29/2016   Procedure: TRANSESOPHAGEAL ECHOCARDIOGRAM (  TEE);  Surgeon: Oneil JAYSON Parchment, MD;  Location: [facility] ENDOSCOPY;  Service: Cardiovascular;  Laterality: N/A;   TOTAL ABDOMINAL HYSTERECTOMY  1984   uterus & right ovary   TOTAL KNEE ARTHROPLASTY Left 04/23/2017   TOTAL KNEE ARTHROPLASTY Left 04/23/2017   Procedure: TOTAL KNEE ARTHROPLASTY;  Surgeon: Liam Lerner, MD;  Location: [facility] OR;  Service: Orthopedics;  Laterality: Left;   ULTRASOUND GUIDANCE FOR VASCULAR ACCESS  05/09/2017   Procedure: Ultrasound Guidance For Vascular Access;  Surgeon: Wonda Sharper, MD;  Location: [facility] INVASIVE CV LAB;  Service: Cardiovascular;;     Home Medications:  Prior to Admission medications   Medication Sig Start Date End Date Taking? Authorizing Provider  apixaban  (ELIQUIS ) 5 MG TABS tablet Take 1 tablet (5 mg total) by mouth 2 (two) times daily. 08/16/23   Chandrasekhar, Stanly LABOR, MD  atorvastatin  (LIPITOR) 40 MG tablet Take 1 tablet (40 mg total) by mouth daily. 08/16/23   Santo Stanly LABOR, MD  BD PEN  NEEDLE NANO 2ND GEN 32G X 4 MM MISC daily. 02/07/22   [provider]  Continuous Glucose Receiver (DEXCOM G7 RECEIVER) DEVI Use as directed while on insulin  01/10/23   [name], Wanda K, MD  Continuous Glucose Sensor (DEXCOM G7 SENSOR) MISC Apply 1 sensor to skin as directed to monitor blood sugars. Replace with new sensor every 10 days. 05/07/23   [name], Wanda K, MD  diltiazem  (CARDIZEM  LA) 360 MG 24 hr tablet Take 1 tablet (360 mg total) by mouth daily. 06/05/23   Santo Stanly LABOR, MD  dofetilide  (TIKOSYN ) 250 MCG capsule Take 1 capsule (250 mcg total) by mouth 2 (two) times daily with breakfast and at bedtime. 05/24/23   Nahser, Aleene PARAS, MD  DULoxetine  (CYMBALTA ) 60 MG capsule Take 1 capsule (60 mg total) by mouth daily. 02/28/23   [name], Apolinar POUR, MD  empagliflozin  (JARDIANCE ) 25 MG TABS tablet Take 1 tablet (25 mg total) by mouth daily before breakfast. 12/27/23   [name], Wanda K, MD  ezetimibe  (ZETIA ) 10 MG tablet Take 1 tablet (10 mg total) by mouth daily. 06/05/23   Santo Stanly LABOR, MD  fluticasone  (FLONASE ) 50 MCG/ACT nasal spray Place 1 spray into both nostrils daily as needed for allergies.    [provider]  insulin  degludec (TRESIBA  FLEXTOUCH) 100 UNIT/ML FlexTouch Pen Inject 28 Units into the skin daily as directed 08/17/23   [name], Wanda K, MD  ipratropium (ATROVENT ) 0.06 % nasal spray Place 1 spray into both nostrils as needed for rhinitis.    [provider]  loratadine  (CLARITIN ) 10 MG tablet TAKE 1 TABLET BY MOUTH EVERY DAY Patient taking differently: Take 10 mg by mouth daily as needed for allergies. 04/28/21   [name], Wanda K, MD  metFORMIN  (GLUCOPHAGE ) 1000 MG tablet Take 1 tablet (1,000 mg total) by mouth 2 (two) times daily. 08/16/23   Webb, Padonda B, FNP  metolazone  (ZAROXOLYN ) 2.5 MG tablet Take 1 tablet (2.5 mg total) by mouth as directed. Weekly as needed for weight gain of 3 lb in 24 hours, 5 lb in a week or leg swelling 09/26/23   Rolan Ezra RAMAN, MD  metoprolol  tartrate (LOPRESSOR ) 50 MG tablet Take 1 tablet (50 mg total) by mouth every morning AND 2 tablets (100 mg total) every evening. 05/24/23   Nahser, Aleene PARAS, MD  nitroGLYCERIN  (NITROSTAT ) 0.4 MG SL tablet Place 1 tablet (0.4 mg total) under the tongue every 5 (five) minutes x 3 doses as needed for chest pain. 05/14/23  Chandrasekhar, Mahesh A, MD  NON FORMULARY CPAP at bedtime    [provider]  nystatin -triamcinolone  (MYCOLOG II) cream Apply 1 application topically 2 (two) times daily as needed. 08/23/21   [name], Wanda K, MD  omeprazole  (PRILOSEC) 20 MG capsule Take 1 capsule (20 mg total) by mouth 2 (two) times daily before breakfast and at bedtime. 01/23/24   [name], Apolinar POUR, MD  ondansetron  (ZOFRAN -ODT) 4 MG disintegrating tablet Take 1 tablet (4 mg total) by mouth every 8 (eight) hours as needed for nausea or vomiting. 02/28/23   [name], Wanda K, MD  Polyethyl Glycol-Propyl Glycol (SYSTANE) 0.4-0.3 % SOLN Place 1 drop into both eyes daily as needed (dry eyes).    [provider]  spironolactone  (ALDACTONE ) 25 MG tablet Take 0.5 tablets (12.5 mg total) by mouth daily. 08/16/23   Santo Stanly LABOR, MD  tirzepatide  (MOUNJARO ) 15 MG/0.5ML Pen Inject 15 mg into the skin once a week. 05/16/23   Santo Stanly LABOR, MD  torsemide  (DEMADEX ) 20 MG tablet Take 3 tablets (60 mg total) by mouth daily before breakfast 06/08/23   Nahser, Aleene PARAS, MD  valACYclovir  (VALTREX ) 1000 MG tablet TAKE 2 TABLETS BY MOUTH EVERY 12 HOURS AS NEEDED 02/28/23   [name], Wanda K, MD    Scheduled Meds:  sodium chloride  flush  3 mL Intravenous Q12H   Continuous Infusions:  PRN Meds:   Allergies:    Allergies  Allergen Reactions   Hydrocodone -Guaifenesin  Other (See Comments)    Confusion/ memory loss   Adhesive [Tape] Rash and Other (See Comments)    Allergic to defibrillation pads.    Social History:   Social History   Socioeconomic History   Marital status:  Married    Spouse name: Financial risk analyst   Number of children: 0   Years of education: HS   Highest education level: High school graduate  Occupational History   Occupation: retired    Comment: previously worked [employer]  Tobacco Use   Smoking status: Former    Current packs/day: 0.00    Average packs/day: 1 pack/day for 5.0 years (5.0 ttl pk-yrs)    Types: Cigarettes    Start date: 05/11/1978    Quit date: 07/03/1982    Years since quitting: 41.6   Smokeless tobacco: Never  Vaping Use   Vaping status: Never Used  Substance and Sexual Activity   Alcohol  use: Yes   Drug use: No   Sexual activity: Not Currently  Other Topics Concern   Not on file  Social History Narrative   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   Married    Originally from [place]    Household of two, high school education   Former smoker Therapist, nutritional dogs 7    Retired from [employer]   [id number]   [employer] 2    Social Drivers of [employer]: Low Risk  (01/21/2024)   Overall Financial Resource Strain (CARDIA)    Difficulty of Paying Living Expenses: Not hard at all  Food Insecurity: No Food Insecurity (01/21/2024)   Hunger Vital Sign    Worried About Running Out of Food in the Last Year: Never true    Ran Out of Food in the Last Year: Never true  Transportation Needs: No Transportation Needs (01/21/2024)   PRAPARE - Administrator, Civil Service (Medical): No    Lack of Transportation (Non-Medical): No  Physical Activity: Insufficiently Active (01/21/2024)  Exercise Vital Sign    Days of Exercise per Week: 3 days    Minutes of Exercise per Session: 20 min  Stress: No Stress Concern Present (01/21/2024)   [employer] of Occupational Health - Occupational Stress Questionnaire    Feeling of Stress: Not at all  Social Connections: Socially Integrated (01/21/2024)   Social Connection and Isolation Panel    Frequency of Communication  with Friends and Family: More than three times a week    Frequency of Social Gatherings with Friends and Family: More than three times a week    Attends Religious Services: More than 4 times per year    Active Member of [employer] or Organizations: Yes    Attends Engineer, structural: More than 4 times per year    Marital Status: Married  Catering manager Violence: Not At Risk (01/21/2024)   Humiliation, Afraid, Rape, and Kick questionnaire    Fear of Current or Ex-Partner: No    Emotionally Abused: No    Physically Abused: No    Sexually Abused: No    Family History:    Family History  Problem Relation Age of Onset   Suicidality Father        suicide death pt was 3 yrs, 1950   Arrhythmia Mother    Hypertension Mother    Diabetes Mother    Dementia Mother    Heart attack Brother    Heart disease Paternal Aunt    Prostate cancer Maternal Grandfather    Diabetes Paternal Grandfather        fathers side of the family   Colon cancer Maternal Aunt      ROS:  Please see the history of present illness.   All other ROS reviewed and negative.     Physical Exam/Data: Vitals:   01/29/24 1930 01/29/24 1934  BP: (!) 128/51   Pulse: (!) 52 (!) 39  Resp: (!) 22   Temp: 98.7 F (37.1 C)   TempSrc: Oral   SpO2: 99%    No intake or output data in the 24 hours ending 01/29/24 2159    01/24/2024   10:09 AM 01/21/2024   10:04 AM 01/15/2024    8:58 AM  Last 3 Weights  Weight (lbs) 198 lb 192 lb 8 oz 196 lb  Weight (kg) 89.812 kg 87.317 kg 88.905 kg     There is no height or weight on file to calculate BMI.  General:  Well nourished, well developed, in no acute distress HEENT: normal Neck: no JVD Vascular: No carotid bruits; Distal pulses 2+ bilaterally Cardiac:  normal S1, S2; RRR; no murmur  Lungs:  clear to auscultation bilaterally, no wheezing, rhonchi or rales  Abd: soft, nontender, no hepatomegaly  Ext: no edema Musculoskeletal:  No deformities, BUE and BLE strength  normal and equal Skin: warm and dry  Neuro:  CNs 2-12 intact, no focal abnormalities noted Psych:  Normal affect   EKG:  The EKG was personally reviewed and demonstrates:  sinus bradycardia with occasional V-paced beats. Telemetry:  Telemetry was personally reviewed and demonstrates:  sinus bradycardia in the 50s with frequent V-paced beats.  Relevant CV Studies:  TTE 08/17/2023   1. Left ventricular ejection fraction, by estimation, is 55 to 60%. The  left ventricle has normal function. The left ventricle has no regional  wall motion abnormalities. Left ventricular diastolic parameters are  indeterminate.   2. Right ventricular systolic function is normal. The right ventricular  size is normal. There is mildly  elevated pulmonary artery systolic  pressure. The estimated right ventricular systolic pressure is 36.6 mmHg.   3. The mitral valve is abnormal. Mild mitral valve regurgitation. No  evidence of mitral stenosis.   4. The aortic valve is normal in structure. Aortic valve regurgitation is  not visualized. No aortic stenosis is present.   5. The inferior vena cava is normal in size with greater than 50%  respiratory variability, suggesting right atrial pressure of 3 mmHg.    Laboratory Data: High Sensitivity Troponin:   Recent Labs  Lab 01/29/24 1944  TROPONINIHS 8     Chemistry Recent Labs  Lab 01/29/24 1944  NA 133*  K 4.7  CL 96*  CO2 23  GLUCOSE 119*  BUN 29*  CREATININE 1.40*  CALCIUM  9.4  MG 2.2  GFRNONAA 39*  ANIONGAP 14    No results for input(s): PROT, ALBUMIN , AST, ALT, ALKPHOS, BILITOT in the last 168 hours. Lipids No results for input(s): CHOL, TRIG, HDL, LABVLDL, LDLCALC, CHOLHDL in the last 168 hours.  Hematology Recent Labs  Lab 01/29/24 1944  WBC 11.3*  RBC 4.63  HGB 13.4  HCT 41.8  MCV 90.3  MCH 28.9  MCHC 32.1  RDW 15.9*  PLT 345   Thyroid  No results for input(s): TSH, FREET4 in the last 168 hours.   BNPNo results for input(s): BNP, PROBNP in the last 168 hours.  DDimer No results for input(s): DDIMER in the last 168 hours.  Radiology/Studies:  DG Chest 2 View Result Date: 01/29/2024 CLINICAL DATA:  Shortness of breath EXAM: CHEST - 2 VIEW COMPARISON:  Chest x-ray 04/18/2022 FINDINGS: Left-sided pacemaker is again seen. The heart is mildly enlarged. Lungs are clear. There is no pleural effusion or pneumothorax. No acute fractures are seen. A IMPRESSION: Mild cardiomegaly. No acute cardiopulmonary process. Electronically Signed   By: Greig Pique M.D.   On: 01/29/2024 20:56     Assessment and Plan: Tachy-brady syndrome s/p PPM pAF  Since her presentation, she's been in sinus bradycardia in the 50s with occasional V-paced beats. Her device was not able to communicate during device interrogation today (likely EOS?).  - Recommend admission to medicine with telemetry monitoring  - Will discuss with EP the timing of her generator change (currently scheduled on 8/1)  - Please hold apixaban  and place her on heparin  gtt - Continue Tikyson 250 mg BID - Hold metoprolol  and diltiazem  for now     For questions or updates, please contact [facility] Please consult [email or web address] for contact info under    Signed, Gillian CHRISTELLA Cass, MD  01/29/2024 9:59 PM

## 2024-01-30 ENCOUNTER — Inpatient Hospital Stay (HOSPITAL_COMMUNITY)
Admission: EM | Disposition: A | Discharge status: Home / Self Care | Payer: Self-pay | Source: Home / Self Care | Attending: Internal Medicine

## 2024-01-30 DIAGNOSIS — R001 Bradycardia, unspecified: Secondary | ICD-10-CM

## 2024-01-30 DIAGNOSIS — Z45010 Encounter for checking and testing of cardiac pacemaker pulse generator [battery]: Secondary | ICD-10-CM

## 2024-01-30 HISTORY — PX: PPM GENERATOR CHANGEOUT: EP1233

## 2024-01-30 LAB — MAGNESIUM: Magnesium: 2.4 mg/dL (ref 1.7–2.4)

## 2024-01-30 LAB — CBC
HCT: 37.7 % (ref 36.0–46.0)
Hemoglobin: 11.8 g/dL — ABNORMAL LOW (ref 12.0–15.0)
MCH: 28.6 pg (ref 26.0–34.0)
MCHC: 31.3 g/dL (ref 30.0–36.0)
MCV: 91.3 fL (ref 80.0–100.0)
Platelets: 274 K/uL (ref 150–400)
RBC: 4.13 MIL/uL (ref 3.87–5.11)
RDW: 15.7 % — ABNORMAL HIGH (ref 11.5–15.5)
WBC: 10.3 K/uL (ref 4.0–10.5)
nRBC: 0 % (ref 0.0–0.2)

## 2024-01-30 LAB — CBC WITH DIFFERENTIAL/PLATELET
Abs Immature Granulocytes: 0.03 K/uL (ref 0.00–0.07)
Basophils Absolute: 0.1 K/uL (ref 0.0–0.1)
Basophils Relative: 1 %
Eosinophils Absolute: 0.2 K/uL (ref 0.0–0.5)
Eosinophils Relative: 2 %
HCT: 39.5 % (ref 36.0–46.0)
Hemoglobin: 12.9 g/dL (ref 12.0–15.0)
Immature Granulocytes: 0 %
Lymphocytes Relative: 25 %
Lymphs Abs: 2.6 K/uL (ref 0.7–4.0)
MCH: 29.3 pg (ref 26.0–34.0)
MCHC: 32.7 g/dL (ref 30.0–36.0)
MCV: 89.6 fL (ref 80.0–100.0)
Monocytes Absolute: 0.9 K/uL (ref 0.1–1.0)
Monocytes Relative: 9 %
Neutro Abs: 6.4 K/uL (ref 1.7–7.7)
Neutrophils Relative %: 63 %
Platelets: 317 K/uL (ref 150–400)
RBC: 4.41 MIL/uL (ref 3.87–5.11)
RDW: 15.6 % — ABNORMAL HIGH (ref 11.5–15.5)
WBC: 10.3 K/uL (ref 4.0–10.5)
nRBC: 0 % (ref 0.0–0.2)

## 2024-01-30 LAB — HEPARIN LEVEL (UNFRACTIONATED): Heparin Unfractionated: >1.1 [IU]/mL — ABNORMAL HIGH (ref 0.30–0.70)

## 2024-01-30 LAB — COMPREHENSIVE METABOLIC PANEL WITH GFR
ALT: 15 U/L (ref 0–44)
AST: 16 U/L (ref 15–41)
Albumin: 3.3 g/dL — ABNORMAL LOW (ref 3.5–5.0)
Alkaline Phosphatase: 71 U/L (ref 38–126)
Anion gap: 8 (ref 5–15)
BUN: 23 mg/dL (ref 8–23)
CO2: 27 mmol/L (ref 22–32)
Calcium: 9.1 mg/dL (ref 8.9–10.3)
Chloride: 101 mmol/L (ref 98–111)
Creatinine, Ser: 1 mg/dL (ref 0.44–1.00)
GFR, Estimated: 58 mL/min — ABNORMAL LOW (ref >60)
Glucose, Bld: 110 mg/dL — ABNORMAL HIGH (ref 70–99)
Potassium: 4 mmol/L (ref 3.5–5.1)
Sodium: 136 mmol/L (ref 135–145)
Total Bilirubin: 0.4 mg/dL (ref 0.0–1.2)
Total Protein: 6.5 g/dL (ref 6.5–8.1)

## 2024-01-30 LAB — APTT: aPTT: 53 s — ABNORMAL HIGH (ref 24–36)

## 2024-01-30 LAB — CBG MONITORING, ED: Glucose-Capillary: 100 mg/dL — ABNORMAL HIGH (ref 70–99)

## 2024-01-30 LAB — SURGICAL PCR SCREEN
MRSA, PCR: NEGATIVE
Staphylococcus aureus: NEGATIVE

## 2024-01-30 LAB — GLUCOSE, CAPILLARY
Glucose-Capillary: 101 mg/dL — ABNORMAL HIGH (ref 70–99)
Glucose-Capillary: 143 mg/dL — ABNORMAL HIGH (ref 70–99)

## 2024-01-30 LAB — HEMOGLOBIN A1C
Hgb A1c MFr Bld: 6 % — ABNORMAL HIGH (ref 4.8–5.6)
Mean Plasma Glucose: 125.5 mg/dL

## 2024-01-30 LAB — TROPONIN I (HIGH SENSITIVITY): Troponin I (High Sensitivity): 7 ng/L (ref <18)

## 2024-01-30 LAB — PHOSPHORUS: Phosphorus: 4 mg/dL (ref 2.5–4.6)

## 2024-01-30 SURGERY — PPM GENERATOR CHANGEOUT

## 2024-01-30 MED ORDER — ONDANSETRON HCL 4 MG/2ML IJ SOLN
4.0000 mg | Freq: Four times a day (QID) | INTRAMUSCULAR | Status: DC | PRN
Start: 2024-01-30 — End: 2024-01-30

## 2024-01-30 MED ORDER — LORATADINE 10 MG PO TABS: 10.0000 mg | ORAL_TABLET | Freq: Every day | ORAL | Status: DC | PRN

## 2024-01-30 MED ORDER — TORSEMIDE 20 MG PO TABS
60.0000 mg | ORAL_TABLET | Freq: Every day | ORAL | Status: DC
  Administered 2024-01-30 – 2024-01-31 (×2): 60 mg via ORAL
  Filled 2024-01-30 (×2): qty 3

## 2024-01-30 MED ORDER — CEFAZOLIN SODIUM-DEXTROSE 2-4 GM/100ML-% IV SOLN
2.0000 g | INTRAVENOUS | Status: AC
  Administered 2024-01-30: 2 g via INTRAVENOUS

## 2024-01-30 MED ORDER — SODIUM CHLORIDE 0.9 % IV SOLN: 250.0000 mL | INTRAVENOUS | Status: DC

## 2024-01-30 MED ORDER — LIDOCAINE HCL (PF) 1 % IJ SOLN
INTRAMUSCULAR | Status: AC
Start: 2024-01-30 — End: 2024-01-30
  Filled 2024-01-30: qty 60

## 2024-01-30 MED ORDER — CHLORHEXIDINE GLUCONATE 4 % EX SOLN
60.0000 mL | Freq: Once | CUTANEOUS | Status: AC
  Filled 2024-01-30: qty 15

## 2024-01-30 MED ORDER — PANTOPRAZOLE SODIUM 40 MG PO TBEC
40.0000 mg | DELAYED_RELEASE_TABLET | Freq: Every day | ORAL | Status: DC
  Administered 2024-01-30 – 2024-01-31 (×2): 40 mg via ORAL
  Filled 2024-01-30 (×2): qty 1

## 2024-01-30 MED ORDER — SODIUM CHLORIDE 0.9 % IV SOLN
INTRAVENOUS | Status: AC
  Filled 2024-01-30: qty 2

## 2024-01-30 MED ORDER — SODIUM CHLORIDE 0.9% FLUSH: 3.0000 mL | INTRAVENOUS | Status: DC | PRN

## 2024-01-30 MED ORDER — INSULIN ASPART 100 UNIT/ML IJ SOLN: 0.0000 [IU] | Freq: Three times a day (TID) | INTRAMUSCULAR | Status: DC

## 2024-01-30 MED ORDER — SODIUM CHLORIDE 0.9 % IV SOLN: INTRAVENOUS | Status: DC

## 2024-01-30 MED ORDER — EZETIMIBE 10 MG PO TABS
10.0000 mg | ORAL_TABLET | Freq: Every day | ORAL | Status: DC
  Administered 2024-01-30 – 2024-01-31 (×2): 10 mg via ORAL
  Filled 2024-01-30 (×2): qty 1

## 2024-01-30 MED ORDER — SODIUM CHLORIDE 0.9% FLUSH
3.0000 mL | Freq: Two times a day (BID) | INTRAVENOUS | Status: DC
  Administered 2024-01-30: 3 mL via INTRAVENOUS

## 2024-01-30 MED ORDER — SPIRONOLACTONE 12.5 MG HALF TABLET
12.5000 mg | ORAL_TABLET | Freq: Every day | ORAL | Status: DC
  Administered 2024-01-30 – 2024-01-31 (×2): 12.5 mg via ORAL
  Filled 2024-01-30 (×2): qty 1

## 2024-01-30 MED ORDER — CHLORHEXIDINE GLUCONATE 4 % EX SOLN
60.0000 mL | Freq: Once | CUTANEOUS | Status: AC
  Administered 2024-01-30: 4 via TOPICAL
  Filled 2024-01-30: qty 15

## 2024-01-30 MED ORDER — INSULIN GLARGINE-YFGN 100 UNIT/ML ~~LOC~~ SOLN
28.0000 [IU] | Freq: Every day | SUBCUTANEOUS | Status: DC
  Administered 2024-01-31: 28 [IU] via SUBCUTANEOUS
  Filled 2024-01-30: qty 0.28

## 2024-01-30 MED ORDER — ALBUTEROL SULFATE (2.5 MG/3ML) 0.083% IN NEBU: 2.5000 mg | INHALATION_SOLUTION | RESPIRATORY_TRACT | Status: DC | PRN

## 2024-01-30 MED ORDER — LIDOCAINE HCL (PF) 1 % IJ SOLN
INTRAMUSCULAR | Status: DC | PRN
Start: 2024-01-30 — End: 2024-01-30
  Administered 2024-01-30: 60 mL

## 2024-01-30 MED ORDER — EMPAGLIFLOZIN 25 MG PO TABS
25.0000 mg | ORAL_TABLET | Freq: Every day | ORAL | Status: DC
  Administered 2024-01-31: 25 mg via ORAL
  Filled 2024-01-30 (×3): qty 1

## 2024-01-30 MED ORDER — ONDANSETRON HCL 4 MG PO TABS: 4.0000 mg | ORAL_TABLET | Freq: Four times a day (QID) | ORAL | Status: DC | PRN

## 2024-01-30 MED ORDER — INSULIN DEGLUDEC 100 UNIT/ML ~~LOC~~ SOPN: 28.0000 [IU] | PEN_INJECTOR | Freq: Every day | SUBCUTANEOUS | Status: DC

## 2024-01-30 MED ORDER — POLYETHYL GLYCOL-PROPYL GLYCOL 0.4-0.3 % OP SOLN: 1.0000 [drp] | Freq: Every day | OPHTHALMIC | Status: DC | PRN

## 2024-01-30 MED ORDER — HEPARIN BOLUS VIA INFUSION
2000.0000 [IU] | Freq: Once | INTRAVENOUS | Status: AC
  Administered 2024-01-30: 2000 [IU] via INTRAVENOUS
  Filled 2024-01-30: qty 2000

## 2024-01-30 MED ORDER — CEFAZOLIN SODIUM-DEXTROSE 2-4 GM/100ML-% IV SOLN
INTRAVENOUS | Status: AC
  Filled 2024-01-30: qty 100

## 2024-01-30 MED ORDER — POLYVINYL ALCOHOL 1.4 % OP SOLN: 1.0000 [drp] | OPHTHALMIC | Status: DC | PRN

## 2024-01-30 MED ORDER — SODIUM CHLORIDE 0.9 % IV SOLN
80.0000 mg | INTRAVENOUS | Status: AC
  Administered 2024-01-30: 80 mg

## 2024-01-30 MED ORDER — DULOXETINE HCL 60 MG PO CPEP
60.0000 mg | ORAL_CAPSULE | Freq: Every day | ORAL | Status: DC
  Administered 2024-01-30 – 2024-01-31 (×2): 60 mg via ORAL
  Filled 2024-01-30 (×2): qty 1

## 2024-01-30 MED ORDER — ATORVASTATIN CALCIUM 40 MG PO TABS
40.0000 mg | ORAL_TABLET | Freq: Every day | ORAL | Status: DC
  Administered 2024-01-30 – 2024-01-31 (×2): 40 mg via ORAL
  Filled 2024-01-30 (×2): qty 1

## 2024-01-30 SURGICAL SUPPLY — 6 items
CABLE SURGICAL S-101-97-12 (CABLE) ×1 IMPLANT
ELECT DEFIB PAD ADLT CADENCE (PAD) IMPLANT
PACEMAKER ASSURITY DR-RF (Pacemaker) IMPLANT
PAD DEFIB RADIO PHYSIO CONN (PAD) ×1 IMPLANT
POUCH AIGIS-R ANTIBACT PPM MED (Mesh General) IMPLANT
TRAY PACEMAKER INSERTION (PACKS) ×1 IMPLANT

## 2024-01-30 NOTE — ED Notes (Signed)
 Patient is resting comfortably. Pt ambulated to the restroom.

## 2024-01-30 NOTE — Hospital Course (Addendum)
 78 y.o. female with a hx of ILD on 3L O2, CAD (remote PCI to RCA), tachy-brady s/p Abbot PPM, AFib, CKD (III), HTN, HLD, HFpEF, OSA w/CPAP presents to the ED with lightheadedness, chest pressure and heart rate in 40s EKG showed sinus bradycardia In the ED BP stable, afebrile labs reviewed creatinine 1.4 mild leukocytosis. Patient was seen by cardiology recommended admission and observation and possible expedited generator change for pacemaker and held metoprolol  and Cardizem .  Pacemaker generator changed 7/30 by Dr. Waddell and interrogation done postop, Seen and cleared by EP cardiac this morning, to resume Eliquis  on 8/3 and okay for discharge home  Subjective: Overnight afebrile BP stable  Doing o non home o2, no chest pain eager to go home  Discharge diagnosis :  Tachy-bradycardia syndrome s/p PPM: PAF: Patient with sinus bradycardia since admission, occasional V-paced beats device not able to communicate during device interrogation question EOS.  Pacemaker generator changed 7/30 by Dr. Waddell and interrogation done postop, Seen and cleared by EP cardiac this morning, to resume Eliquis  on 8/3 and okay for discharge home Resume meds per EP who completed med rec this morning  Other history includes  ILD with chronic respiratory failure on 3 L oxygen  CAD s/p PCI to RCA  CKDIII Stable  HTN Stable   HLD on statin    HFpEF GDMT  per Cardio   DMII Stable cont ss SSI AND lantus    OSA  Cont CPAP at bedtime  Class I Obesity w/ Body mass index is 31.95 kg/m.: Will benefit with PCP follow-up, weight loss,healthy lifestyle   DVT prophylaxis:  Code Status:   Code Status: Full Code Family Communication: plan of care discussed with patient at bedside. Patient status is: Remains hospitalized because of severity of illness Level of care: Progressive   Dispo: The patient is from: home         Anticipated disposition: today  Objective: Vitals last 24 hrs: Vitals:   01/31/24  0025 01/31/24 0030 01/31/24 0421 01/31/24 0730  BP:  130/64 130/65 (!) 143/70  Pulse: 90 95 88 93  Resp:  18 18 18   Temp:  98.6 F (37 C) 97.9 F (36.6 C) 98 F (36.7 C)  TempSrc:  Oral Oral Oral  SpO2: 90% 90% 91% 93%  Weight:      Height:        Physical Examination: General exam: AAOX3 HEENT:Oral mucosa moist, Ear/Nose WNL grossly Respiratory system: B/l clear BS,no use of accessory muscle Cardiovascular system: S1 & S2 +, No JVD. Gastrointestinal system: Abdomen soft,NT,ND, BS+ Nervous System: Alert, awake, moving all extremities,and following commands. Extremities: LE edema neg, distal extremities warm.  Skin: No rashes,no icterus. MSK: Normal muscle bulk,tone, power

## 2024-01-30 NOTE — ED Notes (Signed)
 Company rep came to interrogate pacemaker, was unable to, advising battery was likely completely dead.

## 2024-01-30 NOTE — Consult Note (Addendum)
 ELECTROPHYSIOLOGY CONSULT NOTE    Patient ID: [name] MRN: [id number], DOB/AGE: 10-07-1945 78 y.o.  Admit date: 01/29/2024 Date of Consult: 01/30/2024  Primary Physician: Charlett Apolinar POUR, MD Primary Cardiologist: Stanly DELENA Leavens, MD  Electrophysiologist: Dr. Inocencio   Referring Provider: @ATTENDING @  Patient Profile: [name] is a 78 y.o. female with a history of h/o CAD (remote PCI to  RCA), tachy-brady w/PPM, AFib, CKD (III), HTN, HLD, chronic CHF (diastolic), OSA w/CPAP, chronically on home oxygen   who is being seen today for the evaluation of PPM at EOS at the request of Dr. CHRISTOBAL.  HPI:  [name] is a 78 y.o. female with PMH as above. She is well-known to EP team, s/p AF ablation x many on tikosyn .  She was last seen by EP PA earlier this month after not being seen since 2023, no remote monitoring since 2023. During visit, it was noted that her PPM generator was at EOS and she was scheduled for gen change 8/1. She was in sinus rhtyhm with no concerning s/s at that visit.     About 11am yesterday felt LH, like heart wasn't beating right. Checked pulse and was in 40s, O2 down to 88, which is abnormally low for her. Took a nap, still LH with pulse in 40s. Went out to dinner, pulse fluctuating between 40-60s so came into ER for further evaluation.  Since being in ER, she has had no further episodes of LH or dizziness.   Her last dose of eliquis  yesterday AM, on heparin  gtt now.  On 3L oxygen  at home all the time.  Intermittent palps, no cp, pressure, swelling, good appetite    Labs Potassium4.7 (07/29 1944) Magnesium   2.2 (07/29 1944) Creatinine, ser  1.40* (07/29 1944) PLT  274 (07/30 0630) HGB  11.8* (07/30 0630) WBC 10.3 (07/30 0630) Troponin I (High Sensitivity)8, 7 (07/29 1944).    Past Medical History:  Diagnosis Date   Anticoagulant long-term use    pradaxa    Anxiety    Arthritis    fingers, lower back (04/23/2017    CAD (coronary artery  disease) [id number]   March 1984; post PTCA with bare-metal stenting to mid RCA in December 2004   CHF (congestive heart failure) (HCC)    Chronic atrial fibrillation (HCC) 06/2007   Tachybradycardia pacemaker   Chronic kidney disease    10% function - ?R, other kidney is compensating     CVA (cerebral vascular accident) [facility]) [id number]   denies residual on 04/23/2017   Depression    Diplopia 06/19/2008   Qualifier: Diagnosis of  By: Charlett MD, Apolinar POUR    Dysrhythmia    ATRIAL FIBRILATION   Edema of lower extremity    Hyperlipidemia    Hypertension    1981   Inferior myocardial infarction [facility])    acute inferior wall mi/other medical hx   Myocardial infarction (HCC)    1984, [id number]   Obesity    OSA on CPAP    last test- 2010   Pacemaker    Pneumonia 2014   tx. ----  [facility]   Pulmonary hypertension (HCC)    moderate pulmonary hypertension by 10/2016 echo and 10/2013 cardiac cath   Shortness of breath    Skin cancer    cut off right Hsiao; burned off LLE (04/23/2017)   Sleep apnea 1981   Spondylolisthesis    TIA (transient ischemic attack) 2008   Unspecified hemorrhoids without mention of complication 07/15/2003  Colonoscopy--Dr. Avram      Surgical History:  Past Surgical History:  Procedure Laterality Date   ABDOMINAL HYSTERECTOMY     APPENDECTOMY  1984   ATRIAL FIBRILLATION ABLATION N/A 09/29/2016   Procedure: Atrial Fibrillation Ablation;  Surgeon: Will Gladis Norton, MD;  Location: [facility] INVASIVE CV LAB;  Service: Cardiovascular;  Laterality: N/A;   ATRIAL FIBRILLATION ABLATION N/A 02/07/2018   Procedure: ATRIAL FIBRILLATION ABLATION;  Surgeon: Norton Soyla Gladis, MD;  Location: [facility] INVASIVE CV LAB;  Service: Cardiovascular;  Laterality: N/A;   ATRIAL FIBRILLATION ABLATION N/A 03/13/2019   Procedure: ATRIAL FIBRILLATION ABLATION;  Surgeon: Norton Soyla Gladis, MD;  Location: [facility] INVAS[facility] CV LAB;  Service: Cardiovascular;  Laterality: N/A;   ATRIAL  FIBRILLATION ABLATION N/A 07/07/2020   Procedure: ATRIAL FIBRILLATION ABLATION;  Surgeon: Norton Soyla Gladis, MD;  [facility]: [facility] CV LAB;  Service: Cardiovascular;  Laterality: N/A;   BACK SURGERY     CARDIOVERSION N/A 09/12/2017   Procedure: CARDIOVERSION;  Surgeon: Jeffrie Oneil BROCKS, MD;  Location: [facility] ENDOSCOPY;  Service: Cardiovascular;  Laterality: N/A;   CARDIOVERSION N/A 12/13/2017   Procedure: CARDIOVERSION;  Surgeon: Francyne Headland, MD;  Location: [facility] ENDOSCOPY;  Service: Cardiovascular;  Laterality: N/A;   CARDIOVERSION N/A 02/18/2018   Procedure: CARDIOVERSION;  Surgeon: Maranda Leim DEL, MD;  Location: [facility] ENDOSCOPY;  Service: Cardiovascular;  Laterality: N/A;   CARDIOVERSION N/A 05/20/2018   Procedure: CARDIOVERSION;  Surgeon: Shlomo Wilbert SAUNDERS, MD;  Location: [facility] ENDOSCOPY;  Service: Cardiovascular;  Laterality: N/A;   CARDIOVERSION N/A 04/16/2019   Procedure: CARDIOVERSION;  Surgeon: Kate Lonni CROME, MD;  Location: [facility] ENDOSCOPY;  Service: Endoscopy;  Laterality: N/A;   CARDIOVERSION N/A 01/22/2020   Procedure: CARDIOVERSION;  Surgeon: Barbaraann Darryle Ned, MD;  Location: [facility] ENDOSCOPY;  Service: Cardiovascular;  Laterality: N/A;   CARDIOVERSION N/A 02/18/2020   Procedure: CARDIOVERSION;  Surgeon: Loni Soyla LABOR, MD;  Location: [facility] ENDOSCOPY;  Service: Cardiovascular;  Laterality: N/A;   CATARACT EXTRACTION W/ INTRAOCULAR LENS  IMPLANT, BILATERAL Bilateral 01/15/2017- 03/2017   CHOLECYSTECTOMY     CORONARY ANGIOPLASTY  X 2   CORONARY ANGIOPLASTY WITH STENT PLACEMENT  1998; ~ 2007; ?date   1 stent; replaced stent; not sure when I got the last stent (04/23/2017)   DOPPLER ECHOCARDIOGRAPHY  2009   ELECTROPHYSIOLOGIC STUDY N/A 03/31/2016   Procedure: Cardioversion;  Surgeon: Danelle LELON Birmingham, MD;  [facility]: [facility]VAS[facility] CV LAB;  Service: Cardiovascular;  Laterality: N/A;   ELECTROPHYSIOLOGIC STUDY N/A 08/04/2016   Procedure: Cardioversion;  Surgeon: Danelle LELON Birmingham, MD;   Location: [facility] INVASIVE CV LAB;  Service: Cardiovascular;  Laterality: N/A;   INSERT / REPLACE / REMOVE PACEMAKER  06/2007   IR RADIOLOGY PERIPHERAL GUIDED IV START  01/31/2018   IR US  GUIDE VASC ACCESS LEFT  01/31/2018   JOINT REPLACEMENT     LAPAROSCOPIC CHOLECYSTECTOMY  1994   LEFT AND RIGHT HEART CATHETERIZATION WITH CORONARY ANGIOGRAM N/A 10/06/2013   Procedure: LEFT AND RIGHT HEART CATHETERIZATION WITH CORONARY ANGIOGRAM;  Surgeon: Ned LABOR Sor, MD;  Location: [facility] CATH LAB;  Service: Cardiovascular;  Laterality: N/A;   LEFT OOPHORECTOMY Left ~ 1989   POSTERIOR LUMBAR FUSION  2000s - 04/2015 X 3   L3-4; L4-5; L2-3; Dr Malcolm   RIGHT HEART CATH N/A 01/04/2022   Procedure: RIGHT HEART CATH;  Surgeon: Rolan Ezra RAMAN, MD;  Location: [facility] INVASIVE CV LAB;  Service: Cardiovascular;  Laterality: N/A;   RIGHT HEART CATH N/A 05/02/2022   Procedure: RIGHT HEART CATH;  Surgeon: Rolan Ezra RAMAN, MD;  Location: [facility] INVASIVE CV LAB;  Service: Cardiovascular;  Laterality: N/A;   RIGHT HEART CATH N/A 08/21/2023   Procedure: RIGHT HEART CATH;  Surgeon: Rolan Ezra RAMAN, MD;  Location: [facility] INVASIVE CV LAB;  Service: Cardiovascular;  Laterality: N/A;   RIGHT/LEFT HEART CATH AND CORONARY ANGIOGRAPHY N/A 05/09/2017   Procedure: RIGHT/LEFT HEART CATH AND CORONARY ANGIOGRAPHY;  Surgeon: Wonda Sharper, MD;  Location: [facility] INVASIVE CV LAB;  Service: Cardiovascular;  Laterality: N/A;   SKIN CANCER EXCISION Right    Niesen   TEE WITHOUT CARDIOVERSION N/A 09/29/2016   Procedure: TRANSESOPHAGEAL ECHOCARDIOGRAM (TEE);  Surgeon: Oneil JAYSON Parchment, MD;  Location: [facility] ENDOSCOPY;  Service: Cardiovascular;  Laterality: N/A;   TOTAL ABDOMINAL HYSTERECTOMY  1984   uterus & right ovary   TOTAL KNEE ARTHROPLASTY Left 04/23/2017   TOTAL KNEE ARTHROPLASTY Left 04/23/2017   Procedure: TOTAL KNEE ARTHROPLASTY;  Surgeon: Liam Lerner, MD;  Location: [facility] OR;  Service: Orthopedics;  Laterality: Left;   ULTRASOUND GUIDANCE FOR VASCULAR ACCESS   05/09/2017   Procedure: Ultrasound Guidance For Vascular Access;  Surgeon: Wonda Sharper, MD;  Location: [facility] INVASIVE CV LAB;  Service: Cardiovascular;;     (Not in a hospital admission)   Inpatient Medications:   atorvastatin   40 mg Oral Daily   dofetilide   250 mcg Oral Q12H   DULoxetine   60 mg Oral Daily   empagliflozin   25 mg Oral QAC breakfast   ezetimibe   10 mg Oral Daily   insulin  aspart  0-6 Units Subcutaneous TID WC   insulin  glargine-yfgn  28 Units Subcutaneous Daily   pantoprazole   40 mg Oral Daily   sodium chloride  flush  3 mL Intravenous Q12H   spironolactone   12.5 mg Oral Daily   torsemide   60 mg Oral Daily    Allergies:  Allergies  Allergen Reactions   Hydrocodone -Guaifenesin  Other (See Comments)    Confusion Memory loss   Adhesive [Tape] Rash and Other (See Comments)    Allergic to defibrillation pads.    Family History  Problem Relation Age of Onset   Suicidality Father        suicide death pt was 3 yrs, 42   Arrhythmia Mother    Hypertension Mother    Diabetes Mother    Dementia Mother    Heart attack Brother    Heart disease Paternal Aunt    Prostate cancer Maternal Grandfather    Diabetes Paternal Grandfather        fathers side of the family   Colon cancer Maternal Aunt      Physical Exam: Vitals:   01/30/24 0530 01/30/24 0700 01/30/24 0900 01/30/24 0929  BP: 129/65 137/69 (!) 152/75   Pulse: 79 77 76   Resp: (!) 22 19 20    Temp:    97.9 F (36.6 C)  TempSrc:      SpO2: 94% 93% 94%   Weight:  89.8 kg    Height:  5' 6 (1.676 m)      GEN- NAD, A&O x 3, normal affect HEENT: Normocephalic, atraumatic Lungs- CTAB, Normal effort.  Heart- Regular rate and rhythm, No M/G/R.  GI- Soft, NT, ND.  Extremities- No clubbing, cyanosis, or edema   Radiology/Studies: DG Chest 2 View Result Date: 01/29/2024 CLINICAL DATA:  Shortness of breath EXAM: CHEST - 2 VIEW COMPARISON:  Chest x-ray 04/18/2022 FINDINGS: Left-sided pacemaker is again seen.  The heart is mildly enlarged. Lungs are clear. There is no pleural effusion or pneumothorax. No  acute fractures are seen. A IMPRESSION: Mild cardiomegaly. No acute cardiopulmonary process. Electronically Signed   By: Greig Pique M.D.   On: 01/29/2024 20:56   CUP PACEART INCLINIC DEVICE CHECK Result Date: 01/15/2024 Device in VVI Back up and unable to communicate.  Has met EOS since last transmission, 10/2021. Gen change scheduled.   EKG:01/29/2024 at  1929 - SR with 1st deg HB with sinus arrhythmia, rate 56 PR 308 QTC  (personally reviewed)  TELEMETRY: sinus 70-80s (personally reviewed)  DEVICE HISTORY: ST. Jude dual chamber PPM, EOS  Assessment/Plan: #) tachy-brady s/p PPM  Device at EOS, planning for PPM gen change 8/1 Developed LH, dizziness yesterday with pulse by home pulse ox in 40s. This did not improve so she presented to ER for further evaluation Since ebing in ER, she has maintained sinus rhythm with rates in the 70-80s without dizziness, LH She is NPO for gen change, pending lab availability  #) parox AFib #) aflutter On tikosyn  250mcg q12h QTC stable at Hold home metoprolol  at this time Holding home eliquis  for procedure, will stop heparin  gtt She is in sinus now  Dr. Waddell has seen     For questions or updates, please contact [facility] Please consult [email or web address] for contact info under Cardiology/STEMI.  Signed, Chantal Needle, NP  01/30/2024 10:34 AM  EP Attending  Patient seen and examined. She is well known to me from prior clinic visits remotely. She has been followed by Dr. Inocencio but not recently and she has not been having her PPM checked. She presented with symptomatic bradycardia and HR's in the 30's. Her PPM has no charge and is not working. She was placed on IV heparin  yesterday and this has been stopped. She is referred to consider PPM gen change out. The patient is fairly insistent about getting this done prior to discharge  home. On exam she is a well appearing obese woman, NAD. Lungs are clear. CV with a RRR. Ext demonstrate no edema. Neuro isnon-focal. Tele with NSR and sinus brady. A/P Sinus node dysfunction Non-functioning PPM  I have discussed the treatment options, time permitting if we will replace her PPM later today.   Danelle Tiyah Zelenak,MD

## 2024-01-30 NOTE — Plan of Care (Signed)
   Problem: Education: Goal: Ability to describe self-care measures that may prevent or decrease complications (Diabetes Survival Skills Education) will improve Outcome: Progressing Goal: Individualized Educational Video(s) Outcome: Progressing   Problem: Coping: Goal: Ability to adjust to condition or change in health will improve Outcome: Progressing

## 2024-01-30 NOTE — Progress Notes (Signed)
 PROGRESS NOTE [name]  FMW:[id number] DOB: 05-Sep-1945 DOA: 01/29/2024 PCP: Charlett Apolinar POUR, MD  Brief Narrative/Hospital Course: 78 y.o. female with a hx of ILD on 3L O2, CAD (remote PCI to RCA), tachy-brady s/p Abbot PPM, AFib, CKD (III), HTN, HLD, HFpEF, OSA w/CPAP presents to the ED with lightheadedness, chest pressure and heart rate in 40s EKG showed sinus bradycardia In the ED BP stable, afebrile labs reviewed creatinine 1.4 mild leukocytosis. Patient was seen by cardiology recommended admission and observation and possible expedited generator change for pacemaker and held metoprolol  and Cardizem   Subjective: Seen and examined today in the ED Denies chest pain lightheadedness.  Resting comfortably  Assessment and plan:  Tachy-bradycardia syndrome s/p PPM: PAF: Patient with sinus bradycardia since admission, occasional V-paced beats device not able to communicate during device interrogation question EOS. Cardiology following closely, continue to monitor on telemetry Cont to hold metoprolol  Cardizem .  Continue Tikosyn .  Eliquis  switched to heparin  drip.  Other history includes  ILD with chronic respiratory failure on 3 L oxygen  CAD s/p PCI to RCA  CKDIII Stable  HTN Stable>holding metoprolol  and cardizem     HLD on statin    HFpEF resume GDMT  per Cardio   DMII Stable cont ss SSI AND lantus    OSA  Cont CPAP at bedtime  Class I Obesity w/ Body mass index is 31.95 kg/m.: Will benefit with PCP follow-up, weight loss,healthy lifestyle   DVT prophylaxis: heparin  bolus via infusion 2,000 Units Start: 01/30/24 0800 Code Status:   Code Status: Full Code Family Communication: plan of care discussed with patient at bedside. Patient status is: Remains hospitalized because of severity of illness Level of care: Progressive   Dispo: The patient is from: home         Anticipated disposition: TBD Objective: Vitals last 24 hrs: Vitals:   01/30/24 0429 01/30/24 0515  01/30/24 0530 01/30/24 0700  BP:  136/65 129/65 137/69  Pulse:  78 79 77  Resp:  20 (!) 22 19  Temp: 98.2 F (36.8 C)     TempSrc: Oral     SpO2:  96% 94% 93%  Weight:    89.8 kg  Height:    5' 6 (1.676 m)    Physical Examination: General exam: alert awake HEENT:Oral mucosa moist, Ear/Nose WNL grossly Respiratory system: Bilaterally clear BS,no use of accessory muscle Cardiovascular system: S1 & S2 +, No JVD. Gastrointestinal system: Abdomen soft,NT,ND, BS+ Nervous System: Alert, awake, moving all extremities,and following commands. Extremities: LE edema neg, distal extremities warm.  Skin: No rashes,no icterus. MSK: Normal muscle bulk,tone, power     Data Reviewed: I have personally reviewed following labs and imaging studies ( see epic result tab) CBC: Recent Labs  Lab 01/29/24 1944 01/30/24 0630  WBC 11.3* 10.3  NEUTROABS 6.9  --   HGB 13.4 11.8*  HCT 41.8 37.7  MCV 90.3 91.3  PLT 345 274   CMP: Recent Labs  Lab 01/29/24 1944  NA 133*  K 4.7  CL 96*  CO2 23  GLUCOSE 119*  BUN 29*  CREATININE 1.40*  CALCIUM  9.4  MG 2.2   GFR: Estimated Creatinine Clearance: 38 mL/min (A) (by C-G formula based on SCr of 1.4 mg/dL (H)). No results for input(s): AST, ALT, ALKPHOS, BILITOT, PROT, ALBUMIN  in the last 168 hours. No results for input(s): LIPASE, AMYLASE in the last 168 hours. No results for input(s): AMMONIA in the last 168 hours. Coagulation Profile: No results for input(s): INR, PROTIME in the  last 168 hours. Unresulted Labs (From admission, onward)     Start     Ordered   01/31/24 0500  APTT  Daily,   R      01/29/24 2245   01/31/24 0500  Heparin  level (unfractionated)  Daily,   R      01/29/24 2245   01/30/24 1600  APTT  Once-Timed,   TIMED        01/30/24 0751   01/30/24 0801  Hemoglobin A1c  Once,   R       Comments: To assess prior glycemic control    01/30/24 0800   01/30/24 0741  Comprehensive metabolic panel  Once,   R         01/30/24 0740   01/30/24 0741  Magnesium   Once,   R        01/30/24 0740   01/30/24 0741  Phosphorus  Once,   R        01/30/24 0740   01/30/24 0740  CBC with Differential/Platelet  Once,   R        01/30/24 0740   01/30/24 0500  CBC  Daily,   R      01/29/24 2245           Antimicrobials/Microbiology: Anti-infectives (From admission, onward)    None         Component Value Date/Time   SDES URINE, RANDOM 03/28/2016 1050   SPECREQUEST NONE 03/28/2016 1050   CULT NO GROWTH 03/28/2016 1050   REPTSTATUS 03/29/2016 FINAL 03/28/2016 1050     Medications reviewed:  Scheduled Meds:  atorvastatin   40 mg Oral Daily   dofetilide   250 mcg Oral Q12H   DULoxetine   60 mg Oral Daily   empagliflozin   25 mg Oral QAC breakfast   ezetimibe   10 mg Oral Daily   insulin  aspart  0-6 Units Subcutaneous TID WC   insulin  degludec  28 Units Subcutaneous Daily   pantoprazole   40 mg Oral Daily   sodium chloride  flush  3 mL Intravenous Q12H   spironolactone   12.5 mg Oral Daily   torsemide   60 mg Oral Daily   Continuous Infusions:  heparin  1,300 Units/hr (01/30/24 0849)    Mennie LAMY, MD [facility] 01/30/2024, 9:40 AM

## 2024-01-30 NOTE — H&P (Addendum)
 History and Physical    [name] FMW:[id number] DOB: 12-07-1945 DOA: 01/29/2024  PCP: Charlett Apolinar POUR, MD  Patient coming from: home  I have personally briefly reviewed patient's old medical records in [facility]  Chief Complaint:  symptomatic bradycardia/ dysfunctional pacemaker  HPI: [name] is a 78 y.o. female with medical history significant of  hx of ILD on 3L O2, CAD s/p PCI to RCA, tachy-brady syndrome s/p Abbot PPM, AFib, CKD (III), HTN, HLD, HFpEF, OSA w/CPAP who presents with light headness  and chest pressure with drop in hr to 40's.  Patient notes symptoms started am of presentation. Patient became concerned as she was told that her pacemakers battery required an update which was scheduled in a few days. Patient currently in ED notes that her symptoms have resolved no chest pain pain , no light headedness. She notes no current complaints and denies n/v/d/ f/c/ or current sob. She states she feels at her baseline.   ED Course:  Vitals: afeb bp 128/ 51, hr 52, rr 22 sat 99%   Patient was seen by cardiology who recommended admission for observation and possibly expedited generator change.  Holding metoprolol  and cardizem .   Wbc 11.3, hgb 13.4, plt 345,  Mag 2.2  NA 133 (139), K 4.7, cl 96, glu 119, cr 1.4 (1.2) Ce7,8  EKG: sinus brady at 56 with 1st degree AV block ,pvc    Review of Systems: As per HPI otherwise 10 point review of systems negative.   Past Medical History:  Diagnosis Date   Anticoagulant long-term use    pradaxa    Anxiety    Arthritis    fingers, lower back (04/23/2017    CAD (coronary artery disease) 8010,7996   March 1984; post PTCA with bare-metal stenting to mid RCA in December 2004   CHF (congestive heart failure) (HCC)    Chronic atrial fibrillation (HCC) 06/2007   Tachybradycardia pacemaker   Chronic kidney disease    10% function - ?R, other kidney is compensating     CVA (cerebral vascular accident) [facility]) [phone]    denies residual on 04/23/2017   Depression    Diplopia 06/19/2008   Qualifier: Diagnosis of  By: Charlett MD, Apolinar POUR    Dysrhythmia    ATRIAL FIBRILATION   Edema of lower extremity    Hyperlipidemia    Hypertension    1981   Inferior myocardial infarction [facility])    acute inferior wall mi/other medical hx   Myocardial infarction (HCC)    1984, 8001,7996   Obesity    OSA on CPAP    last test- 2010   Pacemaker    Pneumonia 2014   tx. ----  [facility]   Pulmonary hypertension (HCC)    moderate pulmonary hypertension by 10/2016 echo and 10/2013 cardiac cath   Shortness of breath    Skin cancer    cut off right [place]; burned off LLE (04/23/2017)   Sleep apnea 1981   Spondylolisthesis    TIA (transient ischemic attack) 2008   Unspecified hemorrhoids without mention of complication 07/15/2003   Colonoscopy--Dr. Avram     Past Surgical History:  Procedure Laterality Date   ABDOMINAL HYSTERECTOMY     APPENDECTOMY  1984   ATRIAL FIBRILLATION ABLATION N/A 09/29/2016   Procedure: Atrial Fibrillation Ablation;  Surgeon: Will Gladis Norton, MD;  [facility] INVASIVE CV LAB;  Service: Cardiovascular;  Laterality: N/A;   ATRIAL FIBRILLATION ABLATION N/A 02/07/2018   Procedure: ATRIAL FIBRILLATION ABLATION;  Surgeon: Inocencio Soyla Lunger, MD;  Location: [facility] INVASIVE CV LAB;  Service: Cardiovascular;  Laterality: N/A;   ATRIAL FIBRILLATION ABLATION N/A 03/13/2019   Procedure: ATRIAL FIBRILLATION ABLATION;  Surgeon: Inocencio Soyla Lunger, MD;  Location: [facility] INVASIVE CV LAB;  Service: Cardiovascular;  Laterality: N/A;   ATRIAL FIBRILLATION ABLATION N/A 07/07/2020   Procedure: ATRIAL FIBRILLATION ABLATION;  Surgeon: Inocencio Soyla Lunger, MD;  Location: [facility] INVASIVE CV LAB;  Service: Cardiovascular;  Laterality: N/A;   BACK SURGERY     CARDIOVERSION N/A 09/12/2017   Procedure: CARDIOVERSION;  Surgeon: Jeffrie Oneil BROCKS, MD;  Location: [facility] ENDOSCOPY;  Service: Cardiovascular;  Laterality: N/A;    CARDIOVERSION N/A 12/13/2017   Procedure: CARDIOVERSION;  Surgeon: Francyne Headland, MD;  Location: [facility] ENDOSCOPY;  Service: Cardiovascular;  Laterality: N/A;   CARDIOVERSION N/A 02/18/2018   Procedure: CARDIOVERSION;  Surgeon: Maranda Leim DEL, MD;  Location: [facility] ENDOSCOPY;  Service: Cardiovascular;  Laterality: N/A;   CARDIOVERSION N/A 05/20/2018   Procedure: CARDIOVERSION;  Surgeon: Shlomo Wilbert SAUNDERS, MD;  Location: [facility] ENDOSCOPY;  Service: Cardiovascular;  Laterality: N/A;   CARDIOVERSION N/A 04/16/2019   Procedure: CARDIOVERSION;  Surgeon: Kate Lonni CROME, MD;  Location: [facility] ENDOSCOPY;  Service: Endoscopy;  Laterality: N/A;   CARDIOVERSION N/A 01/22/2020   Procedure: CARDIOVERSION;  Surgeon: Barbaraann Darryle Ned, MD;  Location: [facility] ENDOSCOPY;  Service: Cardiovascular;  Laterality: N/A;   CARDIOVERSION N/A 02/18/2020   Procedure: CARDIOVERSION;  Surgeon: Loni Soyla LABOR, MD;  Location: [facility] ENDOSCOPY;  Service: Cardiovascular;  Laterality: N/A;   CATARACT EXTRACTION W/ INTRAOCULAR LENS  IMPLANT, BILATERAL Bilateral 01/15/2017- 03/2017   CHOLECYSTECTOMY     CORONARY ANGIOPLASTY  X 2   CORONARY ANGIOPLASTY WITH STENT PLACEMENT  1998; ~ 2007; ?date   1 stent; replaced stent; not sure when I got the last stent (04/23/2017)   DOPPLER ECHOCARDIOGRAPHY  2009   ELECTROPHYSIOLOGIC STUDY N/A 03/31/2016   Procedure: Cardioversion;  Surgeon: Danelle LELON Birmingham, MD;  [facility] INVAS[facility] CV LAB;  Service: Cardiovascular;  Laterality: N/A;   ELECTROPHYSIOLOGIC STUDY N/A 08/04/2016   Procedure: Cardioversion;  Surgeon: Danelle LELON Birmingham, MD;  Location: [facility] INVASIVE CV LAB;  Service: Cardiovascular;  Laterality: N/A;   INSERT / REPLACE / REMOVE PACEMAKER  06/2007   IR RADIOLOGY PERIPHERAL GUIDED IV START  01/31/2018   IR US  GUIDE VASC ACCESS LEFT  01/31/2018   JOINT REPLACEMENT     LAPAROSCOPIC CHOLECYSTECTOMY  1994   LEFT AND RIGHT HEART CATHETERIZATION WITH CORONARY ANGIOGRAM N/A 10/06/2013   Procedure: LEFT AND  RIGHT HEART CATHETERIZATION WITH CORONARY ANGIOGRAM;  Surgeon: Ned LABOR Sor, MD;  Location: [facility] CATH LAB;  Service: Cardiovascular;  Laterality: N/A;   LEFT OOPHORECTOMY Left ~ 1989   POSTERIOR LUMBAR FUSION  2000s - 04/2015 X 3   L3-4; L4-5; L2-3; Dr Malcolm   RIGHT HEART CATH N/A 01/04/2022   Procedure: RIGHT HEART CATH;  Surgeon: Rolan Ezra RAMAN, MD;  Location: [facility] INVASIVE CV LAB;  Service: Cardiovascular;  Laterality: N/A;   RIGHT HEART CATH N/A 05/02/2022   Procedure: RIGHT HEART CATH;  Surgeon: Rolan Ezra RAMAN, MD;  Location: [facility] INVASIVE CV LAB;  Service: Cardiovascular;  Laterality: N/A;   RIGHT HEART CATH N/A 08/21/2023   Procedure: RIGHT HEART CATH;  Surgeon: Rolan Ezra RAMAN, MD;  Location: [facility] INVASIVE CV LAB;  Service: Cardiovascular;  Laterality: N/A;   RIGHT/LEFT HEART CATH AND CORONARY ANGIOGRAPHY N/A 05/09/2017   Procedure: RIGHT/LEFT HEART CATH AND CORONARY ANGIOGRAPHY;  Surgeon: Wonda Sharper, MD;  [facility] INVASIVE CV LAB;  Service: Cardiovascular;  Laterality: N/A;   SKIN CANCER EXCISION Right    [place]   TEE WITHOUT CARDIOVERSION N/A 09/29/2016   Procedure: TRANSESOPHAGEAL ECHOCARDIOGRAM (TEE);  Surgeon: Oneil JAYSON Parchment, MD;  Location: [facility] ENDOSCOPY;  Service: Cardiovascular;  Laterality: N/A;   TOTAL ABDOMINAL HYSTERECTOMY  1984   uterus & right ovary   TOTAL KNEE ARTHROPLASTY Left 04/23/2017   TOTAL KNEE ARTHROPLASTY Left 04/23/2017   Procedure: TOTAL KNEE ARTHROPLASTY;  Surgeon: Liam Lerner, MD;  Location: [facility] OR;  Service: Orthopedics;  Laterality: Left;   ULTRASOUND GUIDANCE FOR VASCULAR ACCESS  05/09/2017   Procedure: Ultrasound Guidance For Vascular Access;  Surgeon: Wonda Sharper, MD;  Location: [facility] INVASIVE CV LAB;  Service: Cardiovascular;;     reports that she quit smoking about 41 years ago. Her smoking use included cigarettes. She started smoking about 45 years ago. She has a 5 pack-year smoking history. She has never used smokeless tobacco. She reports  current alcohol  use. She reports that she does not use drugs.  Allergies  Allergen Reactions   Hydrocodone -Guaifenesin  Other (See Comments)    Confusion/ memory loss   Adhesive [Tape] Rash and Other (See Comments)    Allergic to defibrillation pads.    Family History  Problem Relation Age of Onset   Suicidality Father        suicide death pt was 3 yrs, 35   Arrhythmia Mother    Hypertension Mother    Diabetes Mother    Dementia Mother    Heart attack Brother    Heart disease Paternal Aunt    Prostate cancer Maternal Grandfather    Diabetes Paternal Grandfather        fathers side of the family   Colon cancer Maternal Aunt     Prior to Admission medications   Medication Sig Start Date End Date Taking? Authorizing Provider  apixaban  (ELIQUIS ) 5 MG TABS tablet Take 1 tablet (5 mg total) by mouth 2 (two) times daily. 08/16/23   Chandrasekhar, Stanly LABOR, MD  atorvastatin  (LIPITOR) 40 MG tablet Take 1 tablet (40 mg total) by mouth daily. 08/16/23   Santo Stanly LABOR, MD  BD PEN NEEDLE NANO 2ND GEN 32G X 4 MM MISC daily. 02/07/22   [provider]  Continuous Glucose Receiver (DEXCOM G7 RECEIVER) DEVI Use as directed while on insulin  01/10/23   [name], Wanda K, MD  Continuous Glucose Sensor (DEXCOM G7 SENSOR) MISC Apply 1 sensor to skin as directed to monitor blood sugars. Replace with new sensor every 10 days. 05/07/23   [name], Wanda K, MD  diltiazem  (CARDIZEM  LA) 360 MG 24 hr tablet Take 1 tablet (360 mg total) by mouth daily. 06/05/23   Santo Stanly LABOR, MD  dofetilide  (TIKOSYN ) 250 MCG capsule Take 1 capsule (250 mcg total) by mouth 2 (two) times daily with breakfast and at bedtime. 05/24/23   Nahser, Aleene PARAS, MD  DULoxetine  (CYMBALTA ) 60 MG capsule Take 1 capsule (60 mg total) by mouth daily. 02/28/23   [name], Apolinar POUR, MD  empagliflozin  (JARDIANCE ) 25 MG TABS tablet Take 1 tablet (25 mg total) by mouth daily before breakfast. 12/27/23   [name], Wanda K, MD   ezetimibe  (ZETIA ) 10 MG tablet Take 1 tablet (10 mg total) by mouth daily. 06/05/23   Santo Stanly LABOR, MD  fluticasone  (FLONASE ) 50 MCG/ACT nasal spray Place 1 spray into both nostrils daily as needed for allergies.    [provider]  insulin  degludec (TRESIBA  FLEXTOUCH)  100 UNIT/ML FlexTouch Pen Inject 28 Units into the skin daily as directed 08/17/23   [name], Wanda K, MD  ipratropium (ATROVENT ) 0.06 % nasal spray Place 1 spray into both nostrils as needed for rhinitis.    [provider]  loratadine  (CLARITIN ) 10 MG tablet TAKE 1 TABLET BY MOUTH EVERY DAY Patient taking differently: Take 10 mg by mouth daily as needed for allergies. 04/28/21   [name], Wanda K, MD  metFORMIN  (GLUCOPHAGE ) 1000 MG tablet Take 1 tablet (1,000 mg total) by mouth 2 (two) times daily. 08/16/23   Webb, Padonda B, FNP  metolazone  (ZAROXOLYN ) 2.5 MG tablet Take 1 tablet (2.5 mg total) by mouth as directed. Weekly as needed for weight gain of 3 lb in 24 hours, 5 lb in a week or leg swelling 09/26/23   Rolan Ezra RAMAN, MD  metoprolol  tartrate (LOPRESSOR ) 50 MG tablet Take 1 tablet (50 mg total) by mouth every morning AND 2 tablets (100 mg total) every evening. 05/24/23   Nahser, Aleene PARAS, MD  nitroGLYCERIN  (NITROSTAT ) 0.4 MG SL tablet Place 1 tablet (0.4 mg total) under the tongue every 5 (five) minutes x 3 doses as needed for chest pain. 05/14/23   Chandrasekhar, Stanly LABOR, MD  NON FORMULARY CPAP at bedtime    [provider]  nystatin -triamcinolone  (MYCOLOG II) cream Apply 1 application topically 2 (two) times daily as needed. 08/23/21   [name], Wanda K, MD  omeprazole  (PRILOSEC) 20 MG capsule Take 1 capsule (20 mg total) by mouth 2 (two) times daily before breakfast and at bedtime. 01/23/24   [name], Apolinar POUR, MD  ondansetron  (ZOFRAN -ODT) 4 MG disintegrating tablet Take 1 tablet (4 mg total) by mouth every 8 (eight) hours as needed for nausea or vomiting. 02/28/23   [name], Wanda K, MD   Polyethyl Glycol-Propyl Glycol (SYSTANE) 0.4-0.3 % SOLN Place 1 drop into both eyes daily as needed (dry eyes).    [provider]  spironolactone  (ALDACTONE ) 25 MG tablet Take 0.5 tablets (12.5 mg total) by mouth daily. 08/16/23   Santo Stanly LABOR, MD  tirzepatide  (MOUNJARO ) 15 MG/0.5ML Pen Inject 15 mg into the skin once a week. 05/16/23   Santo Stanly LABOR, MD  torsemide  (DEMADEX ) 20 MG tablet Take 3 tablets (60 mg total) by mouth daily before breakfast 06/08/23   Nahser, Aleene PARAS, MD  valACYclovir  (VALTREX ) 1000 MG tablet TAKE 2 TABLETS BY MOUTH EVERY 12 HOURS AS NEEDED 02/28/23   Charlett Apolinar POUR, MD    Physical Exam: Vitals:   01/30/24 0429 01/30/24 0515 01/30/24 0530 01/30/24 0700  BP:  136/65 129/65 137/69  Pulse:  78 79 77  Resp:  20 (!) 22 19  Temp: 98.2 F (36.8 C)     TempSrc: Oral     SpO2:  96% 94% 93%  Weight:    89.8 kg  Height:    5' 6 (1.676 m)    Constitutional: NAD, calm, comfortable Vitals:   01/30/24 0429 01/30/24 0515 01/30/24 0530 01/30/24 0700  BP:  136/65 129/65 137/69  Pulse:  78 79 77  Resp:  20 (!) 22 19  Temp: 98.2 F (36.8 C)     TempSrc: Oral     SpO2:  96% 94% 93%  Weight:    89.8 kg  Height:    5' 6 (1.676 m)   Eyes: Pupils equal lids and conjunctivae normal ENMT: Mucous membranes are moist. Posterior pharynx clear of any exudate or lesions.Normal dentition.  Neck: normal, supple, no masses, no  thyromegaly Respiratory: clear to auscultation bilaterally, no wheezing, no crackles. Normal respiratory effort. No accessory muscle use.  Cardiovascular: Regular rate and rhythm, no murmurs / rubs / gallops. No extremity edema. 2+ pedal pulses..  Abdomen: no tenderness, no masses palpated. No hepatosplenomegaly. Bowel sounds positive.  Musculoskeletal: no clubbing / cyanosis. No joint deformity upper and lower extremities. Good ROM, no contractures. Normal muscle tone.  Skin: no rashes, lesions, ulcers. No induration Neurologic:  CN grossly intact. Sensation intact, Strength 5/5 in all 4.  Psychiatric: Normal judgment and insight. Alert and oriented x 3. Normal mood.    Labs on Admission: I have personally reviewed following labs and imaging studies  CBC: Recent Labs  Lab 01/29/24 1944 01/30/24 0630  WBC 11.3* 10.3  NEUTROABS 6.9  --   HGB 13.4 11.8*  HCT 41.8 37.7  MCV 90.3 91.3  PLT 345 274   Basic Metabolic Panel: Recent Labs  Lab 01/29/24 1944  NA 133*  K 4.7  CL 96*  CO2 23  GLUCOSE 119*  BUN 29*  CREATININE 1.40*  CALCIUM  9.4  MG 2.2   GFR: Estimated Creatinine Clearance: 38 mL/min (A) (by C-G formula based on SCr of 1.4 mg/dL (H)). Liver Function Tests: No results for input(s): AST, ALT, ALKPHOS, BILITOT, PROT, ALBUMIN  in the last 168 hours. No results for input(s): LIPASE, AMYLASE in the last 168 hours. No results for input(s): AMMONIA in the last 168 hours. Coagulation Profile: No results for input(s): INR, PROTIME in the last 168 hours. Cardiac Enzymes: No results for input(s): CKTOTAL, CKMB, CKMBINDEX, TROPONINI in the last 168 hours. BNP (last 3 results) Recent Labs    05/14/23 0930  PROBNP 853*   HbA1C: No results for input(s): HGBA1C in the last 72 hours. CBG: No results for input(s): GLUCAP in the last 168 hours. Lipid Profile: No results for input(s): CHOL, HDL, LDLCALC, TRIG, CHOLHDL, LDLDIRECT in the last 72 hours. Thyroid  Function Tests: No results for input(s): TSH, T4TOTAL, FREET4, T3FREE, THYROIDAB in the last 72 hours. Anemia Panel: No results for input(s): VITAMINB12, FOLATE, FERRITIN, TIBC, IRON, RETICCTPCT in the last 72 hours. Urine analysis:    Component Value Date/Time   COLORURINE STRAW (A) 06/26/2020 1013   APPEARANCEUR CLEAR 06/26/2020 1013   LABSPEC 1.006 06/26/2020 1013   PHURINE 7.0 06/26/2020 1013   GLUCOSEU 150 (A) 06/26/2020 1013   HGBUR NEGATIVE 06/26/2020 1013   HGBUR  negative 06/01/2010 0841   BILIRUBINUR Negative 04/20/2021 1120   KETONESUR NEGATIVE 06/26/2020 1013   PROTEINUR Negative 08/23/2021 0930   PROTEINUR NEGATIVE 06/26/2020 1013   UROBILINOGEN 0.2 08/23/2021 0930   UROBILINOGEN 0.2 06/01/2010 0841   NITRITE neg 08/23/2021 0930   NITRITE NEGATIVE 06/26/2020 1013   LEUKOCYTESUR Negative 08/23/2021 0930   LEUKOCYTESUR NEGATIVE 06/26/2020 1013    Radiological Exams on Admission: DG Chest 2 View Result Date: 01/29/2024 CLINICAL DATA:  Shortness of breath EXAM: CHEST - 2 VIEW COMPARISON:  Chest x-ray 04/18/2022 FINDINGS: Left-sided pacemaker is again seen. The heart is mildly enlarged. Lungs are clear. There is no pleural effusion or pneumothorax. No acute fractures are seen. A IMPRESSION: Mild cardiomegaly. No acute cardiopulmonary process. Electronically Signed   By: Greig Pique M.D.   On: 01/29/2024 20:56    EKG: Independently reviewed.   Assessment/Plan Principal Problem:   Symptomatic bradycardia   Symptomatic bradycardia in setting pacemaker dysfunction  Tachy brady syndrome --hold AV nodal agents currently  - continue with tikyson -npo , with plan for generation exchange  -  holding eliquis / start heparin  drip in prep for procedure  hx of ILD  -on 3L O2 -no acute flare   CAD s/p PCI to RCA -no active issues  -preliminary ischemic evaluation no signs of active ischemia  Afib -eliquis  on hold  -currently on heparin  drip    CKDIII -close to baseline    HTN -stable  -holding metoprolol  and cardizem     HLD -continue statin   HFpEF -resume GDMT   DMII -Iss/fs -Resume lantus    OSA  -CPAP at bedtime   DVT prophylaxis: heparin  drip Code Status: full/ as discussed per patient wishes in event of cardiac arrest  Family Communication: none at bedside Disposition Plan: patient  expected to be admitted greater than 2 midnights  Consults called: cardiology Admission status: progressive care    Camila DELENA Ned  MD [facility]   If 7PM-7AM, please contact night-coverage [email or web address] Password [email or web address]  01/30/2024, 7:40 AM

## 2024-01-30 NOTE — Progress Notes (Signed)
 PHARMACY - ANTICOAGULATION CONSULT NOTE  Pharmacy Consult for heparin   Indication: atrial fibrillation (on eliquis  prior to admission)   Allergies  Allergen Reactions   Hydrocodone -Guaifenesin  Other (See Comments)    Confusion/ memory loss   Adhesive [Tape] Rash and Other (See Comments)    Allergic to defibrillation pads.    Patient Measurements: Height: 5' 6 (167.6 cm) Weight: 89.8 kg (197 lb 15.6 oz) IBW/kg (Calculated) : 59.3 HEPARIN  DW (KG): 78.8  Vital Signs: Temp: 98.2 F (36.8 C) (07/30 0429) Temp Source: Oral (07/30 0429) BP: 137/69 (07/30 0700) Pulse Rate: 77 (07/30 0700)  Labs: Recent Labs    01/29/24 1944 01/30/24 0630 01/30/24 0631  HGB 13.4 11.8*  --   HCT 41.8 37.7  --   PLT 345 274  --   APTT  --  53*  --   HEPARINUNFRC  --   --  >1.10*  CREATININE 1.40*  --   --   TROPONINIHS 7  8  --   --     Estimated Creatinine Clearance: 38 mL/min (A) (by C-G formula based on SCr of 1.4 mg/dL (H)).   Medical History: Past Medical History:  Diagnosis Date   Anticoagulant long-term use    pradaxa    Anxiety    Arthritis    fingers, lower back (04/23/2017    CAD (coronary artery disease) 8010,7996   March 1984; post PTCA with bare-metal stenting to mid RCA in December 2004   CHF (congestive heart failure) (HCC)    Chronic atrial fibrillation (HCC) 06/2007   Tachybradycardia pacemaker   Chronic kidney disease    10% function - ?R, other kidney is compensating     CVA (cerebral vascular accident) [facility]) [phone]   denies residual on 04/23/2017   Depression    Diplopia 06/19/2008   Qualifier: Diagnosis of  By: Charlett MD, Apolinar POUR    Dysrhythmia    ATRIAL FIBRILATION   Edema of lower extremity    Hyperlipidemia    Hypertension    1981   Inferior myocardial infarction [facility])    acute inferior wall mi/other medical hx   Myocardial infarction (HCC)    1984, 8001,7996   Obesity    OSA on CPAP    last test- 2010   Pacemaker    Pneumonia 2014    tx. ----  [facility]   Pulmonary hypertension (HCC)    moderate pulmonary hypertension by 10/2016 echo and 10/2013 cardiac cath   Shortness of breath    Skin cancer    cut off right [place]; burned off LLE (04/23/2017)   Sleep apnea 1981   Spondylolisthesis    TIA (transient ischemic attack) 2008   Unspecified hemorrhoids without mention of complication 07/15/2003   Colonoscopy--Dr. Avram    Assessment: [name] is a 78 y.o. year old female admitted on 01/29/2024 with concern for symptomatic bradycardia. On eliquis  prior to admission for afib (last dose 7/29 @0830 ). Given presence of DOAC prior to admission, will monitor aPTT until correlates with heparin  level (anti-xa). With plans for pacemaker exchange on 8/1, pharmacy consulted to dose heparin .  aPTT 53, subtherapeutic  Heparin  level >1.10, supratherapeutic as expected with presence of DOAC prior to admission Heparin  currently running at 1100 units/hr. No issues noted with bleeding or infusion.  Goal of Therapy:  aPTT 66-102 seconds Anti-Xa 0.3-0.7 Monitor platelets by anticoagulation protocol: Yes   Plan:  Heparin  2000 units x 1 as bolus followed by increase heparin  infusion to 1300 units/hr 8h aPTT  Daily aPTT, heparin  level, CBC, and monitoring for bleeding F/u plans for pacemaker placement and resuming oral anticoagulation   Thank you for allowing pharmacy to participate in this patient's care.  Leonor GORMAN Bash, PharmD Emergency Medicine Clinical Pharmacist 01/30/2024,7:36 AM

## 2024-01-31 ENCOUNTER — Encounter (HOSPITAL_COMMUNITY): Payer: Self-pay | Admitting: Internal Medicine

## 2024-01-31 ENCOUNTER — Other Ambulatory Visit: Payer: Self-pay

## 2024-01-31 DIAGNOSIS — R001 Bradycardia, unspecified: Secondary | ICD-10-CM | POA: Diagnosis not present

## 2024-01-31 DIAGNOSIS — I4891 Unspecified atrial fibrillation: Secondary | ICD-10-CM

## 2024-01-31 LAB — CBC
HCT: 41.8 % (ref 36.0–46.0)
Hemoglobin: 13.6 g/dL (ref 12.0–15.0)
MCH: 28.8 pg (ref 26.0–34.0)
MCHC: 32.5 g/dL (ref 30.0–36.0)
MCV: 88.4 fL (ref 80.0–100.0)
Platelets: 306 K/uL (ref 150–400)
RBC: 4.73 MIL/uL (ref 3.87–5.11)
RDW: 15.8 % — ABNORMAL HIGH (ref 11.5–15.5)
WBC: 10.3 K/uL (ref 4.0–10.5)
nRBC: 0 % (ref 0.0–0.2)

## 2024-01-31 LAB — GLUCOSE, CAPILLARY: Glucose-Capillary: 120 mg/dL — ABNORMAL HIGH (ref 70–99)

## 2024-01-31 MED FILL — Cefazolin Sodium-Dextrose IV Solution 2 GM/100ML-4%: INTRAVENOUS | Qty: 100 | Status: AC

## 2024-01-31 MED FILL — Gentamicin Sulfate Inj 40 MG/ML: INTRAMUSCULAR | Qty: 80 | Status: AC

## 2024-01-31 NOTE — TOC CM/SW Note (Signed)
 Transition of Care [facility]) - Inpatient Brief Assessment   Patient Details  Name: [name] MRN: [id number] Date of Birth: Oct 10, 1945  [facility]) CM/SW Contact:    Sudie Erminio Deems, RN Phone Number: 01/31/2024, 10:06 AM   Clinical Narrative: Patient presented for symptomatic bradycardia-post PPM generator change. PTA patient was from home with family support. Patient has oxygen  in the home. Patient has insurance and PCP- gets to all appointments. No home needs identified at this time. Patient has transportation home.    Transition of Care Asessment: Insurance and Status: Insurance coverage has been reviewed Patient has primary care physician: Yes Home environment has been reviewed: reviewed Prior level of function:: independent with oxygen  use. Prior/Current Home Services: No current home services Social Drivers of Health Review: SDOH reviewed no interventions necessary Readmission risk has been reviewed: Yes Transition of care needs: no transition of care needs at this time

## 2024-01-31 NOTE — Discharge Summary (Signed)
 Physician Discharge Summary  [name] FMW:[id number] DOB: 1945-10-12 DOA: 01/29/2024  PCP: Charlett Apolinar POUR, MD  Admit date: 01/29/2024 Discharge date: 01/31/2024 Recommendations for Outpatient Follow-up:  Follow up with PCP in 1 weeks-call for appointment Please obtain BMP/CBC in one week  Discharge Dispo: home Discharge Condition: Stable Code Status:   Code Status: Full Code Diet recommendation:  Diet Order             Diet Carb Modified Fluid consistency: Thin; Room service appropriate? Yes  Diet effective now                    Brief/Interim Summary: 78 y.o. female with a hx of ILD on 3L O2, CAD (remote PCI to RCA), tachy-brady s/p Abbot PPM, AFib, CKD (III), HTN, HLD, HFpEF, OSA w/CPAP presents to the ED with lightheadedness, chest pressure and heart rate in 40s EKG showed sinus bradycardia In the ED BP stable, afebrile labs reviewed creatinine 1.4 mild leukocytosis. Patient was seen by cardiology recommended admission and observation and possible expedited generator change for pacemaker and held metoprolol  and Cardizem .  Pacemaker generator changed 7/30 by Dr. Waddell and interrogation done postop, Seen and cleared by EP cardiac this morning, to resume Eliquis  on 8/3 and okay for discharge home  Subjective: Overnight afebrile BP stable  Doing o non home o2, no chest pain eager to go home  Discharge diagnosis :  Tachy-bradycardia syndrome s/p PPM: PAF: Patient with sinus bradycardia since admission, occasional V-paced beats device not able to communicate during device interrogation question EOS.  Pacemaker generator changed 7/30 by Dr. Waddell and interrogation done postop, Seen and cleared by EP cardiac this morning, to resume Eliquis  on 8/3 and okay for discharge home Resume meds per EP who completed med rec this morning  Other history includes  ILD with chronic respiratory failure on 3 L oxygen  CAD s/p PCI to RCA  CKDIII Stable  HTN Stable   HLD on statin     HFpEF GDMT  per Cardio   DMII Stable cont ss SSI AND lantus    OSA  Cont CPAP at bedtime  Class I Obesity w/ Body mass index is 31.95 kg/m.: Will benefit with PCP follow-up, weight loss,healthy lifestyle   DVT prophylaxis:  Code Status:   Code Status: Full Code Family Communication: plan of care discussed with patient at bedside. Patient status is: Remains hospitalized because of severity of illness Level of care: Progressive   Dispo: The patient is from: home         Anticipated disposition: today  Objective: Vitals last 24 hrs: Vitals:   01/31/24 0025 01/31/24 0030 01/31/24 0421 01/31/24 0730  BP:  130/64 130/65 (!) 143/70  Pulse: 90 95 88 93  Resp:  18 18 18   Temp:  98.6 F (37 C) 97.9 F (36.6 C) 98 F (36.7 C)  TempSrc:  Oral Oral Oral  SpO2: 90% 90% 91% 93%  Weight:      Height:        Physical Examination: General exam: AAOX3 HEENT:Oral mucosa moist, Ear/Nose WNL grossly Respiratory system: B/l clear BS,no use of accessory muscle Cardiovascular system: S1 & S2 +, No JVD. Gastrointestinal system: Abdomen soft,NT,ND, BS+ Nervous System: Alert, awake, moving all extremities,and following commands. Extremities: LE edema neg, distal extremities warm.  Skin: No rashes,no icterus. MSK: Normal muscle bulk,tone, power     Discharge Instructions  Discharge Instructions     Discharge instructions   Complete by: As directed  Please call call MD or return to ER for similar or worsening recurring problem that brought you to hospital or if any fever,nausea/vomiting,abdominal pain, uncontrolled pain, chest pain,  shortness of breath or any other alarming symptoms.  Please follow-up your doctor as instructed in a week time and call the office for appointment.  Please avoid alcohol , smoking, or any other illicit substance and maintain healthy habits including taking your regular medications as prescribed.  You were cared for by a hospitalist during your  hospital stay. If you have any questions about your discharge medications or the care you received while you were in the hospital after you are discharged, you can call the unit and ask to speak with the hospitalist on call if the hospitalist that took care of you is not available.  Once you are discharged, your primary care physician will handle any further medical issues. Please note that NO REFILLS for any discharge medications will be authorized once you are discharged, as it is imperative that you return to your primary care physician (or establish a relationship with a primary care physician if you do not have one) for your aftercare needs so that they can reassess your need for medications and monitor your lab values   Increase activity slowly   Complete by: As directed       Allergies as of 01/31/2024       Reactions   Hydrocodone -guaifenesin  Other (See Comments)   Confusion Memory loss   Adhesive [tape] Rash, Other (See Comments)   Allergic to defibrillation pads.        Medication List     PAUSE taking these medications    Eliquis  5 MG Tabs tablet Wait to take this until: February 03, 2024 Generic drug: apixaban  Take 1 tablet (5 mg total) by mouth 2 (two) times daily.       TAKE these medications    atorvastatin  40 MG tablet Commonly known as: LIPITOR Take 1 tablet (40 mg total) by mouth daily.   Dexcom G7 Receiver Devi Use as directed while on insulin    Dexcom G7 Sensor Misc Apply 1 sensor to skin as directed to monitor blood sugars. Replace with new sensor every 10 days.   dofetilide  250 MCG capsule Commonly known as: TIKOSYN  Take 1 capsule (250 mcg total) by mouth 2 (two) times daily with breakfast and at bedtime.   DULoxetine  60 MG capsule Commonly known as: CYMBALTA  Take 1 capsule (60 mg total) by mouth daily.   ezetimibe  10 MG tablet Commonly known as: ZETIA  Take 1 tablet (10 mg total) by mouth daily.   Jardiance  25 MG Tabs tablet Generic drug:  empagliflozin  Take 1 tablet (25 mg total) by mouth daily before breakfast. What changed: when to take this   loratadine  10 MG tablet Commonly known as: CLARITIN  TAKE 1 TABLET BY MOUTH EVERY DAY What changed:  when to take this reasons to take this   Matzim LA  360 MG 24 hr tablet Generic drug: diltiazem  Take 1 tablet (360 mg total) by mouth daily.   metFORMIN  1000 MG tablet Commonly known as: GLUCOPHAGE  Take 1 tablet (1,000 mg total) by mouth 2 (two) times daily.   metolazone  2.5 MG tablet Commonly known as: ZAROXOLYN  Take 1 tablet (2.5 mg total) by mouth as directed. Weekly as needed for weight gain of 3 lb in 24 hours, 5 lb in a week or leg swelling   metoprolol  tartrate 50 MG tablet Commonly known as: LOPRESSOR  Take 1 tablet (50 mg total)  by mouth every morning AND 2 tablets (100 mg total) every evening.   Mounjaro  15 MG/0.5ML Pen Generic drug: tirzepatide  Inject 15 mg into the skin once a week. What changed: when to take this   nitroGLYCERIN  0.4 MG SL tablet Commonly known as: NITROSTAT  Place 1 tablet (0.4 mg total) under the tongue every 5 (five) minutes x 3 doses as needed for chest pain.   omeprazole  20 MG capsule Commonly known as: PRILOSEC Take 1 capsule (20 mg total) by mouth 2 (two) times daily before breakfast and at bedtime. What changed: when to take this   ondansetron  4 MG disintegrating tablet Commonly known as: ZOFRAN -ODT Take 1 tablet (4 mg total) by mouth every 8 (eight) hours as needed for nausea or vomiting.   OXYGEN  Inhale 4 L/min into the lungs continuous.   spironolactone  25 MG tablet Commonly known as: ALDACTONE  Take 0.5 tablets (12.5 mg total) by mouth daily.   Systane 0.4-0.3 % Soln Generic drug: Polyethyl Glycol-Propyl Glycol Place 1 drop into both eyes daily as needed (dry eyes).   torsemide  20 MG tablet Commonly known as: DEMADEX  Take 3 tablets (60 mg total) by mouth daily before breakfast What changed: when to take this    Tresiba  FlexTouch 100 UNIT/ML FlexTouch Pen Generic drug: insulin  degludec Inject 28 Units into the skin daily as directed   UNABLE TO FIND Inhale 1 Device into the lungs at bedtime. CPAP   valACYclovir  1000 MG tablet Commonly known as: VALTREX  TAKE 2 TABLETS BY MOUTH EVERY 12 HOURS AS NEEDED        Follow-up Information     [name], Apolinar POUR, MD Follow up in 1 week(s).   Specialties: Internal Medicine, Pediatrics Contact information: [address] [place] [facility] [place] [address] [phone]                Allergies  Allergen Reactions   Hydrocodone -Guaifenesin  Other (See Comments)    Confusion Memory loss   Adhesive [Tape] Rash and Other (See Comments)    Allergic to defibrillation pads.    The results of significant diagnostics from this hospitalization (including imaging, microbiology, ancillary and laboratory) are listed below for reference.    Microbiology: Recent Results (from the past 240 hours)  Surgical pcr screen     Status: None   Collection Time: 01/30/24  2:26 PM   Specimen: Nasal Mucosa; Nasal Swab  Result Value Ref Range Status   MRSA, PCR NEGATIVE NEGATIVE Final   Staphylococcus aureus NEGATIVE NEGATIVE Final    Comment: (NOTE) The Xpert SA Assay (FDA approved for NASAL specimens in patients 60 years of age and older), is one component of a comprehensive surveillance program. It is not intended to diagnose infection nor to guide or monitor treatment. Performed at [facility], [address] [place], [place] [address]     Procedures/Studies: EP PPM/ICD IMPLANT Result Date: 01/31/2024 Conclusion: Successful removal of her previously implanted dual-chamber pacemaker and insertion of a new dual-chamber pacemaker in a patient with sinus node dysfunction whose pacemaker had reached end of service. Danelle Birmingham, MD   DG Chest 2 View Result Date: 01/29/2024 CLINICAL DATA:  Shortness of breath EXAM: CHEST - 2 VIEW COMPARISON:  Chest x-ray  04/18/2022 FINDINGS: Left-sided pacemaker is again seen. The heart is mildly enlarged. Lungs are clear. There is no pleural effusion or pneumothorax. No acute fractures are seen. A IMPRESSION: Mild cardiomegaly. No acute cardiopulmonary process. Electronically Signed   By: Greig Pique M.D.   On: 01/29/2024  20:56   CUP PACEART INCLINIC DEVICE CHECK Result Date: 01/15/2024 Device in VVI Back up and unable to communicate.  Has met EOS since last transmission, 10/2021. Gen change scheduled.   Labs: BNP (last 3 results) Recent Labs    08/03/23 1013 09/26/23 0847  BNP 80.4 71.8   Basic Metabolic Panel: Recent Labs  Lab 01/29/24 1944 01/30/24 0740  NA 133* 136  K 4.7 4.0  CL 96* 101  CO2 23 27  GLUCOSE 119* 110*  BUN 29* 23  CREATININE 1.40* 1.00  CALCIUM  9.4 9.1  MG 2.2 2.4  PHOS  --  4.0   Liver Function Tests: Recent Labs  Lab 01/30/24 0740  AST 16  ALT 15  ALKPHOS 71  BILITOT 0.4  PROT 6.5  ALBUMIN  3.3*   No results for input(s): LIPASE, AMYLASE in the last 168 hours. No results for input(s): AMMONIA in the last 168 hours. CBC: Recent Labs  Lab 01/29/24 1944 01/30/24 0630 01/30/24 0740 01/31/24 0421  WBC 11.3* 10.3 10.3 10.3  NEUTROABS 6.9  --  6.4  --   HGB 13.4 11.8* 12.9 13.6  HCT 41.8 37.7 39.5 41.8  MCV 90.3 91.3 89.6 88.4  PLT 345 274 317 306   CBG: Recent Labs  Lab 01/30/24 1335 01/30/24 1743 01/30/24 2052 01/31/24 0729  GLUCAP 100* 101* 143* 120*   Hgb A1c Recent Labs    01/30/24 0801  HGBA1C 6.0*   No results for input(s): TSH, T4TOTAL, T3FREE, THYROIDAB in the last 72 hours.  Invalid input(s): FREET3 Urinalysis    Component Value Date/Time   COLORURINE STRAW (A) 06/26/2020 1013   APPEARANCEUR CLEAR 06/26/2020 1013   LABSPEC 1.006 06/26/2020 1013   PHURINE 7.0 06/26/2020 1013   GLUCOSEU 150 (A) 06/26/2020 1013   HGBUR NEGATIVE 06/26/2020 1013   HGBUR negative 06/01/2010 0841   BILIRUBINUR Negative 04/20/2021  1120   KETONESUR NEGATIVE 06/26/2020 1013   PROTEINUR Negative 08/23/2021 0930   PROTEINUR NEGATIVE 06/26/2020 1013   UROBILINOGEN 0.2 08/23/2021 0930   UROBILINOGEN 0.2 06/01/2010 0841   NITRITE neg 08/23/2021 0930   NITRITE NEGATIVE 06/26/2020 1013   LEUKOCYTESUR Negative 08/23/2021 0930   LEUKOCYTESUR NEGATIVE 06/26/2020 1013   Sepsis Labs Recent Labs  Lab 01/29/24 1944 01/30/24 0630 01/30/24 0740 01/31/24 0421  WBC 11.3* 10.3 10.3 10.3   Microbiology Recent Results (from the past 240 hours)  Surgical pcr screen     Status: None   Collection Time: 01/30/24  2:26 PM   Specimen: Nasal Mucosa; Nasal Swab  Result Value Ref Range Status   MRSA, PCR NEGATIVE NEGATIVE Final   Staphylococcus aureus NEGATIVE NEGATIVE Final    Comment: (NOTE) The Xpert SA Assay (FDA approved for NASAL specimens in patients 36 years of age and older), is one component of a comprehensive surveillance program. It is not intended to diagnose infection nor to guide or monitor treatment. Performed at [facility], [address] [place], [place] [address]    Time coordinating discharge: 35 minutes  SIGNED: Mennie LAMY, MD  [facility] 01/31/2024, 8:59 AM  If 7PM-7AM, please contact night-coverage [email or web address]

## 2024-01-31 NOTE — Plan of Care (Signed)
 Problem: Education: Goal: Ability to describe self-care measures that may prevent or decrease complications (Diabetes Survival Skills Education) will improve 01/31/2024 0923 by Emil Roselie SAUNDERS, RN Outcome: Adequate for Discharge 01/31/2024 [phone] by Emil Roselie SAUNDERS, RN Outcome: Adequate for Discharge Goal: Individualized Educational Video(s) 01/31/2024 0923 by Emil Roselie SAUNDERS, RN Outcome: Adequate for Discharge 01/31/2024 [phone] by Emil Roselie SAUNDERS, RN Outcome: Adequate for Discharge   Problem: Coping: Goal: Ability to adjust to condition or change in health will improve 01/31/2024 0923 by Emil Roselie SAUNDERS, RN Outcome: Adequate for Discharge 01/31/2024 [phone] by Emil Roselie SAUNDERS, RN Outcome: Adequate for Discharge   Problem: Fluid Volume: Goal: Ability to maintain a balanced intake and output will improve 01/31/2024 0923 by Emil Roselie SAUNDERS, RN Outcome: Adequate for Discharge 01/31/2024 [phone] by Emil Roselie SAUNDERS, RN Outcome: Adequate for Discharge   Problem: Health Behavior/Discharge Planning: Goal: Ability to identify and utilize available resources and services will improve 01/31/2024 0923 by Emil Roselie SAUNDERS, RN Outcome: Adequate for Discharge 01/31/2024 [phone] by Emil Roselie SAUNDERS, RN Outcome: Adequate for Discharge Goal: Ability to manage health-related needs will improve 01/31/2024 0923 by Emil Roselie SAUNDERS, RN Outcome: Adequate for Discharge 01/31/2024 [phone] by Emil Roselie SAUNDERS, RN Outcome: Adequate for Discharge   Problem: Metabolic: Goal: Ability to maintain appropriate glucose levels will improve 01/31/2024 0923 by Emil Roselie SAUNDERS, RN Outcome: Adequate for Discharge 01/31/2024 [phone] by Emil Roselie SAUNDERS, RN Outcome: Adequate for Discharge   Problem: Nutritional: Goal: Maintenance of adequate nutrition will improve 01/31/2024 0923 by Emil Roselie SAUNDERS, RN Outcome: Adequate for Discharge 01/31/2024 [phone] by Emil Roselie SAUNDERS, RN Outcome: Adequate for  Discharge Goal: Progress toward achieving an optimal weight will improve 01/31/2024 0923 by Emil Roselie SAUNDERS, RN Outcome: Adequate for Discharge 01/31/2024 [phone] by Emil Roselie SAUNDERS, RN Outcome: Adequate for Discharge   Problem: Skin Integrity: Goal: Risk for impaired skin integrity will decrease 01/31/2024 0923 by Emil Roselie SAUNDERS, RN Outcome: Adequate for Discharge 01/31/2024 [phone] by Emil Roselie SAUNDERS, RN Outcome: Adequate for Discharge   Problem: Tissue Perfusion: Goal: Adequacy of tissue perfusion will improve 01/31/2024 0923 by Emil Roselie SAUNDERS, RN Outcome: Adequate for Discharge 01/31/2024 [phone] by Emil Roselie SAUNDERS, RN Outcome: Adequate for Discharge   Problem: Education: Goal: Knowledge of General Education information will improve Description: Including pain rating scale, medication(s)/side effects and non-pharmacologic comfort measures 01/31/2024 0923 by Emil Roselie SAUNDERS, RN Outcome: Adequate for Discharge 01/31/2024 [phone] by Emil Roselie SAUNDERS, RN Outcome: Adequate for Discharge   Problem: Health Behavior/Discharge Planning: Goal: Ability to manage health-related needs will improve 01/31/2024 0923 by Emil Roselie SAUNDERS, RN Outcome: Adequate for Discharge 01/31/2024 [phone] by Emil Roselie SAUNDERS, RN Outcome: Adequate for Discharge   Problem: Clinical Measurements: Goal: Ability to maintain clinical measurements within normal limits will improve 01/31/2024 0923 by Emil Roselie SAUNDERS, RN Outcome: Adequate for Discharge 01/31/2024 [phone] by Emil Roselie SAUNDERS, RN Outcome: Adequate for Discharge Goal: Will remain free from infection 01/31/[phone] by Emil Roselie SAUNDERS, RN Outcome: Adequate for Discharge 01/31/2024 [phone] by Emil Roselie SAUNDERS, RN Outcome: Adequate for Discharge Goal: Diagnostic test results will improve 01/31/2024 0923 by Emil Roselie SAUNDERS, RN Outcome: Adequate for Discharge 01/31/2024 [phone] by Emil Roselie SAUNDERS, RN Outcome: Adequate for Discharge Goal:  Respiratory complications will improve 01/31/[phone] by Emil Roselie SAUNDERS, RN Outcome: Adequate for Discharge 01/31/2024 [phone] by Emil Roselie SAUNDERS, RN Outcome: Adequate for Discharge Goal: Cardiovascular complication will be avoided 01/31/[phone] by Emil Roselie SAUNDERS, RN Outcome:  Adequate for Discharge 01/31/[phone] by Emil Roselie SAUNDERS, RN Outcome: Adequate for Discharge   Problem: Activity: Goal: Risk for activity intolerance will decrease 01/31/2024 0923 by Emil Roselie SAUNDERS, RN Outcome: Adequate for Discharge 01/31/2024 [phone] by Emil Roselie SAUNDERS, RN Outcome: Adequate for Discharge   Problem: Nutrition: Goal: Adequate nutrition will be maintained 01/31/2024 0923 by Emil Roselie SAUNDERS, RN Outcome: Adequate for Discharge 01/31/2024 [phone] by Emil Roselie SAUNDERS, RN Outcome: Adequate for Discharge   Problem: Coping: Goal: Level of anxiety will decrease 01/31/2024 0923 by Emil Roselie SAUNDERS, RN Outcome: Adequate for Discharge 01/31/2024 [phone] by Emil Roselie SAUNDERS, RN Outcome: Adequate for Discharge   Problem: Elimination: Goal: Will not experience complications related to bowel motility 01/31/2024 0923 by Emil Roselie SAUNDERS, RN Outcome: Adequate for Discharge 01/31/2024 [phone] by Emil Roselie SAUNDERS, RN Outcome: Adequate for Discharge Goal: Will not experience complications related to urinary retention 01/31/2024 0923 by Emil Roselie SAUNDERS, RN Outcome: Adequate for Discharge 01/31/2024 [phone] by Emil Roselie SAUNDERS, RN Outcome: Adequate for Discharge   Problem: Pain Managment: Goal: General experience of comfort will improve and/or be controlled 01/31/2024 0923 by Emil Roselie SAUNDERS, RN Outcome: Adequate for Discharge 01/31/2024 [phone] by Emil Roselie SAUNDERS, RN Outcome: Adequate for Discharge   Problem: Safety: Goal: Ability to remain free from injury will improve 01/31/2024 0923 by Emil Roselie SAUNDERS, RN Outcome: Adequate for Discharge 01/31/2024 [phone] by Emil Roselie SAUNDERS,  RN Outcome: Adequate for Discharge   Problem: Skin Integrity: Goal: Risk for impaired skin integrity will decrease 01/31/2024 0923 by Emil Roselie SAUNDERS, RN Outcome: Adequate for Discharge 01/31/2024 [phone] by Emil Roselie SAUNDERS, RN Outcome: Adequate for Discharge   Problem: Education: Goal: Knowledge of cardiac device and self-care will improve 01/31/2024 0923 by Emil Roselie SAUNDERS, RN Outcome: Adequate for Discharge 01/31/2024 [phone] by Emil Roselie SAUNDERS, RN Outcome: Adequate for Discharge Goal: Ability to safely manage health related needs after discharge will improve 01/31/2024 0923 by Emil Roselie SAUNDERS, RN Outcome: Adequate for Discharge 01/31/2024 [phone] by Emil Roselie SAUNDERS, RN Outcome: Adequate for Discharge Goal: Individualized Educational Video(s) 01/31/2024 0923 by Emil Roselie SAUNDERS, RN Outcome: Adequate for Discharge 01/31/2024 [phone] by Emil Roselie SAUNDERS, RN Outcome: Adequate for Discharge   Problem: Cardiac: Goal: Ability to achieve and maintain adequate cardiopulmonary perfusion will improve 01/31/2024 0923 by Emil Roselie SAUNDERS, RN Outcome: Adequate for Discharge 01/31/2024 [phone] by Emil Roselie SAUNDERS, RN Outcome: Adequate for Discharge

## 2024-01-31 NOTE — Discharge Instructions (Signed)

## 2024-01-31 NOTE — Progress Notes (Addendum)
  Patient Name: [name] Date of Encounter: 01/31/2024  Primary Cardiologist: Stanly DELENA Leavens, [name] Electrophysiologist: Soyla Gladis Norton, [name]  Interval Summary   NAEON Gen change yesterday late afternoon with Dr. Waddell  Interrogation this AM She feels a chest heaviness and is in afib, worried about being off her eliquis   Vital Signs    Vitals:   01/31/24 0025 01/31/24 0030 01/31/24 0421 01/31/24 0730  BP:  130/64 130/65 (!) 143/70  Pulse: 90 95 88 93  Resp:  18 18 18   Temp:  98.6 F (37 C) 97.9 F (36.6 C) 98 F (36.7 C)  TempSrc:  Oral Oral Oral  SpO2: 90% 90% 91% 93%  Weight:      Height:        Intake/Output Summary (Last 24 hours) at 01/31/2024 0802 Last data filed at 01/31/2024 0330 Gross per 24 hour  Intake 50.09 ml  Output --  Net 50.09 ml   Filed Weights   01/30/24 0700  Weight: 89.8 kg    Physical Exam    GEN- NAD, Alert and oriented  Lungs- Clear to ausculation bilaterally, normal work of breathing Cardiac- Irregularly irregular rate and rhythm, no murmurs, rubs or gallops. Outer dressing removed, steri-strips remain in place GI- soft, NT, ND, + BS Extremities- no clubbing or cyanosis. No edema  Telemetry    SR , converted to AFib with rates 80-100s (personally reviewed)  Hospital Course    [name] is a 78 y.o. female with h/o CAD (remote PCI to  RCA), tachy-brady w/PPM, AFib, CKD (III), HTN, HLD, chronic CHF (diastolic), OSA w/CPAP, chronically on home oxygen  admitted for PPM at EOS.  S/p gen change 7/30  Assessment & Plan    #) tachy-brady syndrome s/p PPM PPM gen change 7/30 by Dr. Waddell Interrogation done this AM by industry Reviewed wound care and L shoulder restrictions Resume home eliquis  8/3  Outpatient appts scheduled, directions placed in AVS   #) parox AFib #) aflutter On tikosyn  250mcg q12h with stable QTC She has converted to AFib AF [name] appt scheduled next week to discuss TEE/DCCV, if  needed   EP [name] sign off at this time, please reconsult if needed    For questions or updates, please contact [facility] Please consult [email or web address] for contact info under     Signed, Samantha Riddle, NP  01/31/2024, 8:02 AM    I have seen and examined this patient with [name].  Agree with above, note added to reflect my findings.  Patient is post pacemaker generator change yesterday.  Tolerated the procedure well.  In atrial fibrillation this morning.  GEN: No acute distress.   Neck: No JVD Cardiac: Irregular Respiratory: normal BS bases bilaterally. GI: Soft, nontender, non-distended  MS: No edema; No deformity. Neuro:  Nonfocal  Skin: warm and dry, device site well healed Psych: Normal affect    Tachybradycardia syndrome: Post pacemaker generator change.  Device functioning appropriately.  [name] continue with current management.  [name]. Paroxysmal atrial fibrillation/flutter: On dofetilide .  She has gone back into atrial fibrillation.  [name] make an atrial fibrillation [name] appointment next week.  Would restart her anticoagulation on Sunday.  [name] 01/31/2024 11:27 AM

## 2024-02-01 ENCOUNTER — Encounter: Payer: Self-pay | Admitting: Emergency Medicine

## 2024-02-01 ENCOUNTER — Telehealth: Payer: Self-pay | Admitting: *Deleted

## 2024-02-01 ENCOUNTER — Ambulatory Visit (HOSPITAL_COMMUNITY): Admission: RE | Admit: 2024-02-01 | Source: Home / Self Care | Admitting: Cardiology

## 2024-02-01 ENCOUNTER — Encounter (HOSPITAL_COMMUNITY): Admission: RE | Payer: Self-pay | Source: Home / Self Care

## 2024-02-01 SURGERY — PPM GENERATOR CHANGEOUT

## 2024-02-01 NOTE — Transitions of Care (Post Inpatient/ED Visit) (Signed)
 02/01/2024  Name: [name] MRN: [id number] DOB: July 13, 1945  Today's TOC FU Call Status: Today's TOC FU Call Status:: Successful TOC FU Call Completed TOC FU Call Complete Date: 02/01/24 Patient's Name and Date of Birth confirmed.  Transition Care Management Follow-up Telephone Call Date of Discharge: 01/31/24 Discharge Facility: [facility]) Type of Discharge: Inpatient Admission Primary Inpatient Discharge Diagnosis:: Bradycardia secondary to need for new PPM generator change: completed during hospitalization How have you been since you were released from the hospital?: Better (I am fine, independent and active.  We knew the generator on the pacemaker needed to be switched out- we just didn't know it needed it so soon; even my cardiologist was surprised.  I have too much going on for weekly calls, I am fine, back to normal) Any questions or concerns?: No  Items Reviewed: Did you receive and understand the discharge instructions provided?: Yes (thoroughly reviewed with patient who verbalizes good understanding of same) Medications obtained,verified, and reconciled?: Yes (Medications Reviewed) (Full medication reconciliation/ review completed; no concerns or discrepancies identified; confirmed holding ACT until Sunday 02/02/24 as instructed; self-manages medications and denies questions/ concerns around medications today) Any new allergies since your discharge?: No Dietary orders reviewed?: Yes Type of Diet Ordered:: We eat healthy- diabetic and heart healthy diet Do you have support at home?: Yes People in Home [RPT]: spouse Name of Support/Comfort Primary Source: Reports independent in self-care activities; resides with supportive spouse assists as/ if needed/ indicated  Medications Reviewed Today: Medications Reviewed Today     Reviewed by Dove Gresham M, RN (Registered Nurse) on 02/01/24 at 1456  Med List Status: <None>   Medication Order Taking? Sig Documenting Provider  Last Dose Status Informant  apixaban  (ELIQUIS ) 5 MG TABS tablet [id number]  Take 1 tablet (5 mg total) by mouth 2 (two) times daily. Santo Stanly LABOR, MD  Active Self, Pharmacy Records  atorvastatin  (LIPITOR) 40 MG tablet [id number] Yes Take 1 tablet (40 mg total) by mouth daily. Santo Stanly LABOR, MD  Active Self, Pharmacy Records  Continuous Glucose Receiver WILMOT G7 [place]) ESPIRIDION [id number] Yes Use as directed while on insulin  [name], Wanda K, MD  Active Self, Pharmacy Records  Continuous Glucose Sensor (DEXCOM G7 SENSOR) [place] [id number] Yes Apply 1 sensor to skin as directed to monitor blood sugars. Replace with new sensor every 10 days. [name], Apolinar POUR, MD  Active Self, Pharmacy Records  diltiazem  (CARDIZEM  LA) 360 MG 24 hr tablet [id number] Yes Take 1 tablet (360 mg total) by mouth daily. Santo Stanly LABOR, MD  Active Self, Pharmacy Records  dofetilide  (TIKOSYN ) 250 MCG capsule [id number] Yes Take 1 capsule (250 mcg total) by mouth 2 (two) times daily with breakfast and at bedtime. Nahser, Aleene PARAS, MD  Active Self, Pharmacy Records  DULoxetine  (CYMBALTA ) 60 MG capsule [id number] Yes Take 1 capsule (60 mg total) by mouth daily. [name], Apolinar POUR, MD  Active Self, Pharmacy Records  empagliflozin  (JARDIANCE ) 25 MG TABS tablet [id number] Yes Take 1 tablet (25 mg total) by mouth daily before breakfast. [name], Wanda K, MD  Active Self, Pharmacy Records  ezetimibe  (ZETIA ) 10 MG tablet [id number] Yes Take 1 tablet (10 mg total) by mouth daily. Santo Stanly LABOR, MD  Active Self, Pharmacy Records  insulin  degludec (TRESIBA  FLEXTOUCH) 100 UNIT/ML FlexTouch Pen [id number] Yes Inject 28 Units into the skin daily as directed [name], Wanda K, MD  Active Self, Pharmacy Records  loratadine  (CLARITIN ) 10 MG tablet [id number] Yes TAKE 1 TABLET BY MOUTH EVERY  DAY [name], Wanda K, MD  Active Self, Pharmacy Records  metFORMIN  (GLUCOPHAGE ) 1000 MG tablet [id number] Yes Take 1 tablet (1,000 mg total) by  mouth 2 (two) times daily. Webb, Padonda B, FNP  Active Self, Pharmacy Records  metolazone  (ZAROXOLYN ) 2.5 MG tablet [id number] Yes Take 1 tablet (2.5 mg total) by mouth as directed. Weekly as needed for weight gain of 3 lb in 24 hours, 5 lb in a week or leg swelling Rolan Ezra RAMAN, MD  Active Self, Pharmacy Records  metoprolol  tartrate (LOPRESSOR ) 50 MG tablet [id number] Yes Take 1 tablet (50 mg total) by mouth every morning AND 2 tablets (100 mg total) every evening. Nahser, Aleene PARAS, MD  Active Self, Pharmacy Records  nitroGLYCERIN  (NITROSTAT ) 0.4 MG SL tablet [id number] Yes Place 1 tablet (0.4 mg total) under the tongue every 5 (five) minutes x 3 doses as needed for chest pain. Santo Stanly LABOR, MD  Active Self, Pharmacy Records  omeprazole  (PRILOSEC) 20 MG capsule [id number] Yes Take 1 capsule (20 mg total) by mouth 2 (two) times daily before breakfast and at bedtime. [name], Apolinar POUR, MD  Active Self, Pharmacy Records  ondansetron  (ZOFRAN -ODT) 4 MG disintegrating tablet [id number] Yes Take 1 tablet (4 mg total) by mouth every 8 (eight) hours as needed for nausea or vomiting. [name], Apolinar POUR, MD  Active Self, Pharmacy Records  OXYGEN  [id number] Yes Inhale 4 L/min into the lungs continuous. [provider]  Active Pharmacy Records, Self  Polyethyl Glycol-Propyl Glycol (SYSTANE) 0.4-0.3 % SOLN [id number] Yes Place 1 drop into both eyes daily as needed (dry eyes). [provider]  Active Self, Pharmacy Records  sodium chloride  flush (NS) 0.9 % injection 3 mL [id number]   Wyn Jackee VEAR Mickey., NP  Active   spironolactone  (ALDACTONE ) 25 MG tablet [id number] Yes Take 0.5 tablets (12.5 mg total) by mouth daily. Santo Stanly LABOR, MD  Active Self, Pharmacy Records  tirzepatide  (MOUNJARO ) 15 MG/0.5ML Pen [id number] Yes Inject 15 mg into the skin once a week. Santo Stanly LABOR, MD  Active Self, Pharmacy Records  torsemide  (DEMADEX ) 20 MG tablet [id number] Yes Take 3 tablets (60 mg  total) by mouth daily before breakfast Nahser, Aleene PARAS, MD  Active Self, Pharmacy Records  UNABLE TO FIND [id number] Yes Inhale 1 Device into the lungs at bedtime. CPAP [provider]  Active Pharmacy Records, Self  valACYclovir  (VALTREX ) 1000 MG tablet [id number] Yes TAKE 2 TABLETS BY MOUTH EVERY 12 HOURS AS NEEDED [name], Wanda K, MD  Active Self, Pharmacy Records           Med Note (COFFELL, ANGELA M   Wed Jan 30, 2024  9:48 AM)             Home Care and Equipment/Supplies: Were Home Health Services Ordered?: No Any new equipment or medical supplies ordered?: No  Functional Questionnaire: Do you need assistance with bathing/showering or dressing?: No Do you need assistance with meal preparation?: No Do you need assistance with eating?: No Do you have difficulty maintaining continence: No Do you need assistance with getting out of bed/getting out of a chair/moving?: No Do you have difficulty managing or taking your medications?: No  Follow up appointments reviewed: PCP Follow-up appointment confirmed?: Yes (care coordination outreach in real-time with scheduling care guide to successfully schedule hospital follow up PCP appointment 02/04/24) Date of PCP follow-up appointment?: 02/04/24 Follow-up Provider: PCP- Dr. Charlett- covering provider/ APP Specialist Hospital Follow-up appointment confirmed?: Yes Date of Specialist follow-up appointment?: 02/14/24 Follow-Up Specialty  Provider:: cardiology provider Do you need transportation to your follow-up appointment?: No Do you understand care options if your condition(s) worsen?: Yes-patient verbalized understanding  SDOH Interventions Today    Flowsheet Row Most Recent Value  SDOH Interventions   Food Insecurity Interventions Intervention Not Indicated  Housing Interventions Intervention Not Indicated  Transportation Interventions Intervention Not Indicated  [drives self]  Utilities Interventions Intervention Not Indicated    See TOC assessment tabs for additional assessment/ TOC intervention information  Patient declines need for ongoing/ further care management/ coordination outreach; declines enrollment in 30-day TOC program- declines taking my direct phone number should needs/ concerns arise post-TOC call   Pls call/ message for questions,  Sharran Caratachea Mckinney Ahmia Colford, RN, BSN, Media planner  Transitions of Care  [facility] - [facility] [phone]: direct office

## 2024-02-02 ENCOUNTER — Other Ambulatory Visit: Payer: Self-pay | Admitting: Internal Medicine

## 2024-02-04 ENCOUNTER — Other Ambulatory Visit (HOSPITAL_BASED_OUTPATIENT_CLINIC_OR_DEPARTMENT_OTHER): Payer: Self-pay

## 2024-02-04 ENCOUNTER — Encounter: Payer: Self-pay | Admitting: Family Medicine

## 2024-02-04 ENCOUNTER — Ambulatory Visit (INDEPENDENT_AMBULATORY_CARE_PROVIDER_SITE_OTHER): Admitting: Family Medicine

## 2024-02-04 ENCOUNTER — Other Ambulatory Visit (HOSPITAL_COMMUNITY): Payer: Self-pay

## 2024-02-04 VITALS — BP 118/74 | HR 64 | Temp 97.8°F | Ht 66.0 in | Wt 199.0 lb

## 2024-02-04 DIAGNOSIS — Z09 Encounter for follow-up examination after completed treatment for conditions other than malignant neoplasm: Secondary | ICD-10-CM

## 2024-02-04 DIAGNOSIS — N898 Other specified noninflammatory disorders of vagina: Secondary | ICD-10-CM

## 2024-02-04 MED ORDER — NYSTATIN-TRIAMCINOLONE 100000-0.1 UNIT/GM-% EX CREA
1.0000 | TOPICAL_CREAM | Freq: Two times a day (BID) | CUTANEOUS | 1 refills | Status: AC
  Filled 2024-02-04: qty 30, 15d supply, fill #0

## 2024-02-04 NOTE — Patient Instructions (Addendum)
-  It was a pleasure to care for you today.  -Ordered CBC/BMP based on discharge instructions from recent hospital admission. Office will call with results and will be available on MyChart.  -Refilled Nystatin -Triamcinolone  cream for vaginal itching.  -Follow up with cardiology and pulmonary appointments already scheduled.  -Follow up in 3 months for chronic management with Dr. Charlett.

## 2024-02-04 NOTE — Progress Notes (Signed)
 Established Patient Office Visit   Subjective:  Patient ID: [name], female    DOB: 1945-09-16  Age: 78 y.o. MRN: [id number]  Chief Complaint  Patient presents with   Hospitalization Follow-up    HPI Patient is here for a hospital follow up. Patient was admitted at [facility] on 07/29 through 07/31 for symptomatic bradycardia. She presented to the ED with lightheadedness, chest pressure, and heart rate in 40s. On 07/30, she had pacemaker generator changed by Dr. Waddell and interrogation done post op. Discharged the following day.   She has resumed Eliquis  5mg  BID starting this morning. Denies chest pain, shortness of breath, or palpitations. Fatigue has improved since discharge. Currently, still wearing oxygen  at 3L [place] for Interstitial Lung Disease. She reports her blood pressure has stayed around where it is today in office (118/74) and heart rate has been in the 50-60s.   Based on discharge, patient was suppose to have a CBC and BMP obtained in 1 week. She has follow up appointments scheduled with cardiology and has a pulmonary appointment in October.   Patient is requesting a refill on Nystatin -Triamcinolone  cream that she uses about once a week for itching from wearing depends.  ROS See HPI above     Objective:   BP 118/74   Pulse 64   Temp 97.8 F (36.6 C) (Oral)   Ht 5' 6 (1.676 m)   Wt 199 lb (90.3 kg)   SpO2 98%   BMI 32.12 kg/m    Physical Exam Vitals reviewed.  Constitutional:      General: She is not in acute distress.    Appearance: Normal appearance. She is not ill-appearing, toxic-appearing or diaphoretic.  HENT:     Head: Normocephalic and atraumatic.  Eyes:     General:        Right eye: No discharge.        Left eye: No discharge.     Conjunctiva/sclera: Conjunctivae normal.  Cardiovascular:     Rate and Rhythm: Normal rate. Rhythm irregular.     Heart sounds: Normal heart sounds. No murmur heard.    No friction rub. No gallop.   Pulmonary:     Effort: Pulmonary effort is normal. No respiratory distress.     Breath sounds: Normal breath sounds.     Comments: Wearing oxygen  via [place] at 3L  Musculoskeletal:        General: Normal range of motion.  Skin:    General: Skin is warm and dry.  Neurological:     General: No focal deficit present.     Mental Status: She is alert and oriented to person, place, and time. Mental status is at baseline.  Psychiatric:        Mood and Affect: Mood normal.        Behavior: Behavior normal.        Thought Content: Thought content normal.        Judgment: Judgment normal.      Assessment & Plan:  Hospital discharge follow-up -     CBC with Differential/Platelet -     Basic metabolic panel with GFR  Vaginal itching -     Nystatin -Triamcinolone ; Apply topically 2 (two) times daily as directed.  Dispense: 30 g; Refill: 1  -Ordered CBC/BMP based on discharge instructions from recent hospital admission. Office will call with results and will be available on MyChart.  -Patient is doing good since discharge with no concerns. Improved since discharge.  -Refilled Nystatin -Triamcinolone   cream for vaginal itching.  -Follow up with cardiology and pulmonary appointments already scheduled.   Return in about 3 months (around 05/06/2024) for chronic management: Dr. Charlett .   Samantha Bevans, NP

## 2024-02-05 ENCOUNTER — Other Ambulatory Visit (HOSPITAL_COMMUNITY): Payer: Self-pay

## 2024-02-05 ENCOUNTER — Ambulatory Visit: Payer: Self-pay | Admitting: Family Medicine

## 2024-02-05 ENCOUNTER — Ambulatory Visit (INDEPENDENT_AMBULATORY_CARE_PROVIDER_SITE_OTHER)

## 2024-02-05 DIAGNOSIS — D72829 Elevated white blood cell count, unspecified: Secondary | ICD-10-CM | POA: Diagnosis not present

## 2024-02-05 DIAGNOSIS — D619 Aplastic anemia, unspecified: Secondary | ICD-10-CM | POA: Diagnosis not present

## 2024-02-05 LAB — CBC WITH DIFFERENTIAL/PLATELET
Basophils Absolute: 0.1 K/uL (ref 0.0–0.1)
Basophils Relative: 0.8 % (ref 0.0–3.0)
Eosinophils Absolute: 0.4 K/uL (ref 0.0–0.7)
Eosinophils Relative: 3.7 % (ref 0.0–5.0)
HCT: 40.6 % (ref 36.0–46.0)
Hemoglobin: 13.1 g/dL (ref 12.0–15.0)
Lymphocytes Relative: 20.8 % (ref 12.0–46.0)
Lymphs Abs: 2.2 K/uL (ref 0.7–4.0)
MCHC: 32.2 g/dL (ref 30.0–36.0)
MCV: 88.7 fl (ref 78.0–100.0)
Monocytes Absolute: 0.9 K/uL (ref 0.1–1.0)
Monocytes Relative: 8.1 % (ref 3.0–12.0)
Neutro Abs: 7.1 K/uL (ref 1.4–7.7)
Neutrophils Relative %: 66.6 % (ref 43.0–77.0)
Platelets: 349 K/uL (ref 150.0–400.0)
RBC: 4.57 Mil/uL (ref 3.87–5.11)
RDW: 16.2 % — ABNORMAL HIGH (ref 11.5–15.5)
WBC: 10.6 K/uL — ABNORMAL HIGH (ref 4.0–10.5)

## 2024-02-05 LAB — BASIC METABOLIC PANEL WITH GFR
BUN: 26 mg/dL — ABNORMAL HIGH (ref 6–23)
CO2: 27 meq/L (ref 19–32)
Calcium: 9.6 mg/dL (ref 8.4–10.5)
Chloride: 97 meq/L (ref 96–112)
Creatinine, Ser: 1.08 mg/dL (ref 0.40–1.20)
GFR: 49.38 mL/min — ABNORMAL LOW (ref >60.00)
Glucose, Bld: 85 mg/dL (ref 70–99)
Potassium: 3.8 meq/L (ref 3.5–5.1)
Sodium: 136 meq/L (ref 135–145)

## 2024-02-05 MED ORDER — TRESIBA FLEXTOUCH 100 UNIT/ML ~~LOC~~ SOPN
28.0000 [IU] | PEN_INJECTOR | Freq: Every day | SUBCUTANEOUS | 3 refills | Status: DC
  Filled 2024-03-10 – 2024-03-11 (×3): qty 9, 32d supply, fill #0
  Filled 2024-04-17: qty 9, 32d supply, fill #1
  Filled 2024-05-22: qty 9, 32d supply, fill #2
  Filled 2024-07-07: qty 9, 32d supply, fill #3

## 2024-02-06 ENCOUNTER — Ambulatory Visit: Payer: Self-pay | Admitting: Family Medicine

## 2024-02-06 DIAGNOSIS — R7989 Other specified abnormal findings of blood chemistry: Secondary | ICD-10-CM

## 2024-02-06 LAB — VITAMIN B12: Vitamin B-12: 313 pg/mL (ref 211–911)

## 2024-02-06 LAB — IRON,TIBC AND FERRITIN PANEL
%SAT: 8 % — ABNORMAL LOW (ref 16–45)
Ferritin: 15 ng/mL — ABNORMAL LOW (ref 16–288)
Iron: 31 ug/dL — ABNORMAL LOW (ref 45–160)
TIBC: 405 ug/dL (ref 250–450)

## 2024-02-08 ENCOUNTER — Other Ambulatory Visit (HOSPITAL_COMMUNITY): Payer: Self-pay

## 2024-02-11 ENCOUNTER — Other Ambulatory Visit (HOSPITAL_COMMUNITY): Payer: Self-pay

## 2024-02-11 ENCOUNTER — Other Ambulatory Visit: Payer: Self-pay | Admitting: Internal Medicine

## 2024-02-11 DIAGNOSIS — I480 Paroxysmal atrial fibrillation: Secondary | ICD-10-CM

## 2024-02-11 MED ORDER — APIXABAN 5 MG PO TABS
5.0000 mg | ORAL_TABLET | Freq: Two times a day (BID) | ORAL | 1 refills | Status: DC
  Filled 2024-02-11: qty 180, 90d supply, fill #0
  Filled 2024-05-10: qty 180, 90d supply, fill #1

## 2024-02-11 NOTE — Telephone Encounter (Signed)
 Prescription refill request for Eliquis  received. Indication:cad Last office visit:7/25 Scr:1.08  8/25 Age: 78 Weight:90.3  kg  Prescription refilled

## 2024-02-13 ENCOUNTER — Other Ambulatory Visit: Payer: Self-pay | Admitting: Internal Medicine

## 2024-02-13 ENCOUNTER — Other Ambulatory Visit (HOSPITAL_COMMUNITY): Payer: Self-pay

## 2024-02-13 ENCOUNTER — Other Ambulatory Visit: Payer: Self-pay

## 2024-02-13 MED ORDER — DULOXETINE HCL 60 MG PO CPEP
60.0000 mg | ORAL_CAPSULE | Freq: Every day | ORAL | 3 refills | Status: AC
  Filled 2024-02-13 (×2): qty 90, 90d supply, fill #0
  Filled 2024-05-13: qty 90, 90d supply, fill #1

## 2024-02-14 ENCOUNTER — Ambulatory Visit: Attending: Internal Medicine

## 2024-02-14 DIAGNOSIS — I495 Sick sinus syndrome: Secondary | ICD-10-CM | POA: Diagnosis not present

## 2024-02-14 LAB — CUP PACEART INCLINIC DEVICE CHECK
Battery Remaining Longevity: 132 mo
Battery Voltage: 3.08 V
Brady Statistic RA Percent Paced: 13 %
Brady Statistic RV Percent Paced: 23 %
Date Time Interrogation Session: 20250814152139
Implantable Lead Connection Status: 753985
Implantable Lead Connection Status: 753985
Implantable Lead Implant Date: 20081211
Implantable Lead Implant Date: 20081211
Implantable Lead Location: 753859
Implantable Lead Location: 753860
Implantable Pulse Generator Implant Date: 20250730
Lead Channel Impedance Value: 412.5 Ohm
Lead Channel Impedance Value: 437.5 Ohm
Lead Channel Pacing Threshold Amplitude: 1 V
Lead Channel Pacing Threshold Amplitude: 1 V
Lead Channel Pacing Threshold Amplitude: 1.25 V
Lead Channel Pacing Threshold Pulse Width: 0.5 ms
Lead Channel Pacing Threshold Pulse Width: 0.7 ms
Lead Channel Pacing Threshold Pulse Width: 0.7 ms
Lead Channel Sensing Intrinsic Amplitude: 0.9 mV
Lead Channel Sensing Intrinsic Amplitude: 12 mV
Lead Channel Setting Pacing Amplitude: 2.25 V
Lead Channel Setting Pacing Amplitude: 2.5 V
Lead Channel Setting Pacing Pulse Width: 0.7 ms
Lead Channel Setting Sensing Sensitivity: 2 mV
Pulse Gen Model: 2272
Pulse Gen Serial Number: 8294133

## 2024-02-14 NOTE — Patient Instructions (Signed)
   After Your Pacemaker   Monitor your pacemaker site for redness, swelling, and drainage. Call the device clinic at [phone] if you experience these symptoms or fever/chills.  Your incision was closed with Steri-strips or staples:  You may shower 7 days after your procedure and wash your incision with soap and water. Avoid lotions, ointments, or perfumes over your incision until it is well-healed.  You may use a hot tub or a pool after your wound check appointment if the incision is completely closed. There are no other restrictions in arm movement after your wound check appointment.  You may drive, unless driving has been restricted by your healthcare providers. .  Remote monitoring is used to monitor your pacemaker from home. This monitoring is scheduled every 91 days by our office. It allows [place] to keep an eye on the functioning of your device to ensure it is working properly. You will routinely see your Electrophysiologist annually (more often if necessary).

## 2024-02-14 NOTE — Progress Notes (Signed)
 Normal dual chamber pacemaker wound check. Presenting rhythm: AS/VS 53 . Wound well healed. Routine testing performed. Thresholds, sensing, and impedances consistent with implant measurements. 7 AMS episodes noted c/w AF. On OAC per EPIC. AT/AF burden 8.4%.  Pt enrolled in remote follow-up.

## 2024-02-16 ENCOUNTER — Ambulatory Visit: Payer: Self-pay | Admitting: Cardiology

## 2024-02-26 ENCOUNTER — Encounter (HOSPITAL_COMMUNITY): Payer: Self-pay | Admitting: Internal Medicine

## 2024-02-26 ENCOUNTER — Ambulatory Visit (HOSPITAL_COMMUNITY)
Admit: 2024-02-26 | Discharge: 2024-02-26 | Disposition: A | Discharge status: Home / Self Care | Attending: Internal Medicine | Admitting: Internal Medicine

## 2024-02-26 VITALS — BP 130/78 | HR 55 | Ht 66.0 in | Wt 199.8 lb

## 2024-02-26 DIAGNOSIS — D6869 Other thrombophilia: Secondary | ICD-10-CM | POA: Diagnosis not present

## 2024-02-26 DIAGNOSIS — Z79899 Other long term (current) drug therapy: Secondary | ICD-10-CM | POA: Diagnosis not present

## 2024-02-26 DIAGNOSIS — I480 Paroxysmal atrial fibrillation: Secondary | ICD-10-CM | POA: Diagnosis not present

## 2024-02-26 DIAGNOSIS — Z5181 Encounter for therapeutic drug level monitoring: Secondary | ICD-10-CM

## 2024-02-26 NOTE — Progress Notes (Signed)
 Primary Care Physician: Charlett Apolinar POUR, MD Referring Physician:Dr. Inocencio Joleen JAYSON [name] is a 78 y.o. female with a h/o PPM, chronic diastolic HF,  paroxysmal  Afib/flutter  that is in the afib clinic for persistent  afib for the last 2 weeks. She  feels more shortness of breath and has noted increase in pedal edema. She has had 3 prior ablations, most recent 03/14/2019.  She had had both covid shots and has not missed any anticoagulation with a CHA2DS2VASc of 3.   F/u in afib clinic, 01/30/20, she had a successful CV, 7/22. Unfortunately had ERAF. She has few choices going forward.. She had tikosyn  in the past but it was stopped when she had an infection several years  ago, the pt thinks it was to treat pnumonia . She failed amio after tikosyn . She had had 3 ablations and several cardioversion's. I discussed with Dr. Inocencio. He is not in favor of repeat ablation, he suggested reloading of tikosyn . AV nodal ablation is a possibility as she already has a PPM, but he prefer this to be her last option. Her qt is at 471 ms today and she is on Prozac . She  will contact her PCP to see if she can be switched to Cymbalta , from Prozac ,  preferred antidepressant with tikosyn . She is using benadryl  now and this will need to be stopped.   F/u in afib clinic 02/27/20, as she now one week s/p Tikosyn  loading. She underwent cardioversion after Tikosyn  load and left the hospital in SR. She reports that she feels improved. EKG shows Sinus rhythm with stable qt.   F/u in afib clinic 08/04/20. Pt is now s/p afib 4th ablation performed by Dr. Inocencio 07/07/20. She had a prolonged hospitalization in November for RSV/ CHF in the setting of afib with RVR. Ekg shows SR. She remains on dofetilide  250 mcg bid. She is feeling improved. Denies  any swallowing or groin issues.   Follow up in Afib clinic, 02/26/24. Patient is currently in AV paced rhythm. Hospital admission 7/29-31/2025 with lightheadedness and chest pressure s/p  pacemaker change out on 7/30 by Dr. Waddell. Eliquis  resumed on 8/3. Noted that patient was in Afib on day of discharge. She is on tikosyn  250 mcg BID. No missed doses since resuming Eliquis  on 8/3.   Today, she denies symptoms of palpitations, chest pain, shortness of breath, orthopnea, PND, lower extremity edema, dizziness, presyncope, syncope, or neurologic sequela. The patient is tolerating medications without difficulties and is otherwise without complaint today.   Past Medical History:  Diagnosis Date   Anticoagulant long-term use    pradaxa    Anxiety    Arthritis    fingers, lower back (04/23/2017    CAD (coronary artery disease) 8010,7996   March 1984; post PTCA with bare-metal stenting to mid RCA in December 2004   CHF (congestive heart failure) (HCC)    Chronic atrial fibrillation (HCC) 06/2007   Tachybradycardia pacemaker   Chronic kidney disease    10% function - ?R, other kidney is compensating     CVA (cerebral vascular accident) [facility]) [id number]   denies residual on 04/23/2017   Depression    Diplopia 06/19/2008   Qualifier: Diagnosis of  By: Charlett MD, Apolinar POUR    Dysrhythmia    ATRIAL FIBRILATION   Edema of lower extremity    Hyperlipidemia    Hypertension    1981   Inferior myocardial infarction (HCC)    acute inferior wall mi/other medical hx  Myocardial infarction (HCC)    1984, [id number]   Obesity    OSA on CPAP    last test- 2010   Pacemaker    Pneumonia 2014   tx. ----  [facility]   Pulmonary hypertension (HCC)    moderate pulmonary hypertension by 10/2016 echo and 10/2013 cardiac cath   Shortness of breath    Skin cancer    cut off right [place]; burned off LLE (04/23/2017)   Sleep apnea 1981   Spondylolisthesis    TIA (transient ischemic attack) 2008   Unspecified hemorrhoids without mention of complication 07/15/2003   Colonoscopy--Dr. Avram    Past Surgical History:  Procedure Laterality Date   ABDOMINAL HYSTERECTOMY      APPENDECTOMY  1984   ATRIAL FIBRILLATION ABLATION N/A 09/29/2016   Procedure: Atrial Fibrillation Ablation;  Surgeon: Will Gladis Norton, MD;  Location: [facility] INVASIVE CV LAB;  Service: Cardiovascular;  Laterality: N/A;   ATRIAL FIBRILLATION ABLATION N/A 02/07/2018   Procedure: ATRIAL FIBRILLATION ABLATION;  Surgeon: Norton Soyla Gladis, MD;  Location: [facility] INVASIVE CV LAB;  Service: Cardiovascular;  Laterality: N/A;   ATRIAL FIBRILLATION ABLATION N/A 03/13/2019   Procedure: ATRIAL FIBRILLATION ABLATION;  Surgeon: Norton Soyla Gladis, MD;  Location: [facility] CV LAB;  Service: Cardiovascular;  Laterality: N/A;   ATRIAL FIBRILLATION ABLATION N/A 07/07/2020   Procedure: ATRIAL FIBRILLATION ABLATION;  Surgeon: Norton Soyla Gladis, MD;  Location: [facility] CV [facility];  Service: Cardiovascular;  Laterality: N/A;   BACK SURGERY     CARDIOVERSION N/A 09/12/2017   Procedure: CARDIOVERSION;  Surgeon: Jeffrie Oneil BROCKS, MD;  Location: [facility] ENDOSCOPY;  Service: Cardiovascular;  Laterality: N/A;   CARDIOVERSION N/A 12/13/2017   Procedure: CARDIOVERSION;  Surgeon: Francyne Headland, MD;  Location: [facility] ENDOSCOPY;  Service: Cardiovascular;  Laterality: N/A;   CARDIOVERSION N/A 02/18/2018   Procedure: CARDIOVERSION;  Surgeon: Maranda Leim DEL, MD;  Location: [facility] ENDOSCOPY;  Service: Cardiovascular;  Laterality: N/A;   CARDIOVERSION N/A 05/20/2018   Procedure: CARDIOVERSION;  Surgeon: Shlomo Wilbert SAUNDERS, MD;  Location: [facility] ENDOSCOPY;  Service: Cardiovascular;  Laterality: N/A;   CARDIOVERSION N/A 04/16/2019   Procedure: CARDIOVERSION;  Surgeon: Kate Lonni CROME, MD;  Location: [facility] ENDOSCOPY;  Service: Endoscopy;  Laterality: N/A;   CARDIOVERSION N/A 01/22/2020   Procedure: CARDIOVERSION;  Surgeon: Barbaraann Darryle Ned, MD;  Location: [facility] ENDOSCOPY;  Service: Cardiovascular;  Laterality: N/A;   CARDIOVERSION N/A 02/18/2020   Procedure: CARDIOVERSION;  Surgeon: Loni Soyla LABOR, MD;  Location: [facility] ENDOSCOPY;  Service:  Cardiovascular;  Laterality: N/A;   CATARACT EXTRACTION W/ INTRAOCULAR LENS  IMPLANT, BILATERAL Bilateral 01/15/2017- 03/2017   CHOLECYSTECTOMY     CORONARY ANGIOPLASTY  X 2   CORONARY ANGIOPLASTY WITH STENT PLACEMENT  1998; ~ 2007; ?date   1 stent; replaced stent; not sure when I got the last stent (04/23/2017)   DOPPLER ECHOCARDIOGRAPHY  2009   ELECTROPHYSIOLOGIC STUDY N/A 03/31/2016   Procedure: Cardioversion;  Surgeon: Danelle LELON Birmingham, MD;  Location: [facility] CV LAB;  Service: Cardiovascular;  Laterality: N/A;   ELECTROPHYSIOLOGIC STUDY N/A 08/04/2016   Procedure: Cardioversion;  Surgeon: Danelle LELON Birmingham, MD;  Location: [facility] INVASIVE CV LAB;  Service: Cardiovascular;  Laterality: N/A;   INSERT / REPLACE / REMOVE PACEMAKER  06/2007   IR RADIOLOGY PERIPHERAL GUIDED IV START  01/31/2018   IR US  GUIDE VASC ACCESS LEFT  01/31/2018   JOINT REPLACEMENT     LAPAROSCOPIC CHOLECYSTECTOMY  1994   LEFT AND RIGHT HEART CATHETERIZATION WITH CORONARY ANGIOGRAM N/A  10/06/2013   Procedure: LEFT AND RIGHT HEART CATHETERIZATION WITH CORONARY ANGIOGRAM;  Surgeon: Debby DELENA Sor, MD;  Location[facility] CATH LAB;  Service: Cardiovascular;  Laterality: N/A;   LEFT OOPHORECTOMY Left ~ 1989   POSTERIOR LUMBAR FUSION  2000s - 04/2015 X 3   L3-4; L4-5; L2-3; Dr Malcolm   [facility] GENERATOR CHANGEOUT N/A 01/30/2024   Procedure: PPM GENERATOR CHANGEOUT;  Surgeon: Waddell Danelle ORN, MD;  Location: [facility] INVASIVE CV LAB;  Service: Cardiovascular;  Laterality: N/A;   RIGHT HEART CATH N/A 01/04/2022   Procedure: RIGHT HEART CATH;  Surgeon: Rolan Ezra RAMAN, MD;  Location: [facility] INVASIVE CV LAB;  Service: Cardiovascular;  Laterality: N/A;   RIGHT HEART CATH N/A 05/02/2022   Procedure: RIGHT HEART CATH;  Surgeon: Rolan Ezra RAMAN, MD;  Location: [facility] INVASIVE CV LAB;  Service: Cardiovascular;  Laterality: N/A;   RIGHT HEART CATH N/A 08/21/2023   Procedure: RIGHT HEART CATH;  Surgeon: Rolan Ezra RAMAN, MD;  Location: [facility] INVASIVE CV LAB;  Service:  Cardiovascular;  Laterality: N/A;   RIGHT/LEFT HEART CATH AND CORONARY ANGIOGRAPHY N/A 05/09/2017   Procedure: RIGHT/LEFT HEART CATH AND CORONARY ANGIOGRAPHY;  Surgeon: Wonda Sharper, MD;  Location: [facility] INVASIVE CV LAB;  Service: Cardiovascular;  Laterality: N/A;   SKIN CANCER EXCISION Right    [place]   TEE WITHOUT CARDIOVERSION N/A 09/29/2016   Procedure: TRANSESOPHAGEAL ECHOCARDIOGRAM (TEE);  Surgeon: Oneil JAYSON Parchment, MD;  Location: [facility] ENDOSCOPY;  Service: Cardiovascular;  Laterality: N/A;   TOTAL ABDOMINAL HYSTERECTOMY  1984   uterus & right ovary   TOTAL KNEE ARTHROPLASTY Left 04/23/2017   TOTAL KNEE ARTHROPLASTY Left 04/23/2017   Procedure: TOTAL KNEE ARTHROPLASTY;  Surgeon: Liam Lerner, MD;  Location: [facility] OR;  Service: Orthopedics;  Laterality: Left;   ULTRASOUND GUIDANCE FOR VASCULAR ACCESS  05/09/2017   Procedure: Ultrasound Guidance For Vascular Access;  Surgeon: Wonda Sharper, MD;  Location: [facility] INVASIVE CV LAB;  Service: Cardiovascular;;    Current Outpatient Medications  Medication Sig Dispense Refill   apixaban  (ELIQUIS ) 5 MG TABS tablet Take 1 tablet (5 mg total) by mouth 2 (two) times daily. 180 tablet 1   atorvastatin  (LIPITOR) 40 MG tablet Take 1 tablet (40 mg total) by mouth daily. 90 tablet 3   Continuous Glucose Receiver (DEXCOM G7 RECEIVER) DEVI Use as directed while on insulin  1 each 3   Continuous Glucose Sensor (DEXCOM G7 SENSOR) MISC Apply 1 sensor to skin as directed to monitor blood sugars. Replace with new sensor every 10 days. 3 each 11   diltiazem  (CARDIZEM  LA) 360 MG 24 hr tablet Take 1 tablet (360 mg total) by mouth daily. 90 tablet 3   dofetilide  (TIKOSYN ) 250 MCG capsule Take 1 capsule (250 mcg total) by mouth 2 (two) times daily with breakfast and at bedtime. 180 capsule 3   DULoxetine  (CYMBALTA ) 60 MG capsule Take 1 capsule (60 mg total) by mouth daily. 90 capsule 3   empagliflozin  (JARDIANCE ) 25 MG TABS tablet Take 1 tablet (25 mg total) by mouth daily  before breakfast. 90 tablet 0   ezetimibe  (ZETIA ) 10 MG tablet Take 1 tablet (10 mg total) by mouth daily. 90 tablet 3   insulin  degludec (TRESIBA  FLEXTOUCH) 100 UNIT/ML FlexTouch Pen Inject 28 Units into the skin daily as directed 9 mL 3   loratadine  (CLARITIN ) 10 MG tablet TAKE 1 TABLET BY MOUTH EVERY DAY 90 tablet 0   metFORMIN  (GLUCOPHAGE ) 1000 MG tablet Take 1 tablet (1,000 mg total) by mouth 2 (two)  times daily. 180 tablet 3   metolazone  (ZAROXOLYN ) 2.5 MG tablet Take 1 tablet (2.5 mg total) by mouth as directed. Weekly as needed for weight gain of 3 lb in 24 hours, 5 lb in a week or leg swelling 25 tablet 2   metoprolol  tartrate (LOPRESSOR ) 50 MG tablet Take 1 tablet (50 mg total) by mouth every morning AND 2 tablets (100 mg total) every evening. 270 tablet 3   nitroGLYCERIN  (NITROSTAT ) 0.4 MG SL tablet Place 1 tablet (0.4 mg total) under the tongue every 5 (five) minutes x 3 doses as needed for chest pain. 25 tablet 3   nystatin -triamcinolone  (MYCOLOG II) cream Apply topically 2 (two) times daily as directed. 30 g 1   omeprazole  (PRILOSEC) 20 MG capsule Take 1 capsule (20 mg total) by mouth 2 (two) times daily before breakfast and at bedtime. 60 capsule 3   ondansetron  (ZOFRAN -ODT) 4 MG disintegrating tablet Take 1 tablet (4 mg total) by mouth every 8 (eight) hours as needed for nausea or vomiting. 20 tablet 1   OXYGEN  Inhale 4 L/min into the lungs continuous.     Polyethyl Glycol-Propyl Glycol (SYSTANE) 0.4-0.3 % SOLN Place 1 drop into both eyes daily as needed (dry eyes).     spironolactone  (ALDACTONE ) 25 MG tablet Take 0.5 tablets (12.5 mg total) by mouth daily. 45 tablet 2   tirzepatide  (MOUNJARO ) 15 MG/0.5ML Pen Inject 15 mg into the skin once a week. 2 mL 11   torsemide  (DEMADEX ) 20 MG tablet Take 3 tablets (60 mg total) by mouth daily before breakfast 270 tablet 3   UNABLE TO FIND Inhale 1 Device into the lungs at bedtime. CPAP     valACYclovir  (VALTREX ) 1000 MG tablet TAKE 2 TABLETS  BY MOUTH EVERY 12 HOURS AS NEEDED 34 tablet 2   No current facility-administered medications for this encounter.    Allergies  Allergen Reactions   Hydrocodone -Guaifenesin  Other (See Comments)    Confusion Memory loss   Adhesive [Tape] Rash and Other (See Comments)    Allergic to defibrillation pads.    ROS- All systems are reviewed and negative except as per the HPI above  Physical Exam: Vitals:   02/26/24 1026  BP: 130/78  Pulse: (!) 55  Weight: 90.6 kg  Height: 5' 6 (1.676 m)    Wt Readings from Last 3 Encounters:  02/26/24 90.6 kg  02/04/24 90.3 kg  02/01/24 86.4 kg    Labs: Lab Results  Component Value Date   NA 136 02/04/2024   K 3.8 02/04/2024   CL 97 02/04/2024   CO2 27 02/04/2024   GLUCOSE 85 02/04/2024   BUN 26 (H) 02/04/2024   CREATININE 1.08 02/04/2024   CALCIUM  9.6 02/04/2024   PHOS 4.0 01/30/2024   MG 2.4 01/30/2024   Lab Results  Component Value Date   INR 1.3 (H) 02/17/2020   Lab Results  Component Value Date   CHOL 111 08/03/2023   HDL 28 (L) 08/03/2023   LDLCALC 45 08/03/2023   TRIG 190 (H) 08/03/2023   GEN- The patient is well appearing, alert and oriented x 3 today.   Neck - no JVD or carotid bruit noted Lungs- Clear to ausculation bilaterally, normal work of breathing Heart- Regular bradycardic rate and rhythm, no murmurs, rubs or gallops, PMI not laterally displaced Extremities- no clubbing, cyanosis, or edema Skin - no rash or ecchymosis noted   EKG- Vent. rate 52 BPM PR interval 196 ms QRS duration 164 ms QT/QTcB 554/515 ms P-R-T  axes * 235 67 AV dual-paced rhythm Abnormal ECG When compared with ECG of 31-Jan-2024 00:35, Electronic ventricular pacemaker has replaced Sinus rhythm Vent. rate has decreased BY 36 BPM   Assessment and Plan: 1. Persistent  atrial flutter   4th  afib ablation one month ago  She is currently in AV paced rhythm.  High risk medication monitoring (ICD10: [id number]) Patient requires  ongoing monitoring for anti-arrhythmic medication which has the potential to cause life threatening arrhythmias or AV block. Qtc stable. Corrected interval 475 ms. Continue Tikosyn  250 mcg BID. Bmet from 8/4 stable; mag level 7/30 stable.   2. CHA2DS2VASc score of 3 Continue Eliquis .   3. Diastolic HF She is going to take metolazone  when she gets home.     Follow up as scheduled with Dr. Inocencio. Follow up May 2026 for Tikosyn  surveillance.    Samantha Heinrich, PA-C Afib Clinic [facility] [address] [place], [place] [address] [phone]

## 2024-03-09 ENCOUNTER — Other Ambulatory Visit (HOSPITAL_COMMUNITY): Payer: Self-pay

## 2024-03-10 ENCOUNTER — Other Ambulatory Visit (HOSPITAL_COMMUNITY): Payer: Self-pay

## 2024-03-10 ENCOUNTER — Other Ambulatory Visit: Payer: Self-pay

## 2024-03-11 ENCOUNTER — Other Ambulatory Visit: Payer: Self-pay

## 2024-03-11 ENCOUNTER — Other Ambulatory Visit (HOSPITAL_COMMUNITY): Payer: Self-pay

## 2024-03-18 NOTE — Progress Notes (Signed)
 PCP:  Charlett Apolinar POUR, MD Cardiology: Dr. Santo HF Cardiology: Dr. Rolan  Chief Complaint: CHF  78 y.o. with history of CAD, paroxysmal atrial fibrillation, and HFpEF was referred by Dr. Santo for evaluation of CHF.  Patient had BMS to RCA in 2004, last coronary angiogram in 11/18 showed patent RCA stent with occluded acute marginal branch.  Patient has had 3 atrial fibrillation ablations and has been on amiodarone  in the past.  Currently, she is on Tikosyn  and is maintaining NSR.  She has tachy-brady syndrome with Abbott PPM.  She has been on home oxygen  since 2020 with chronic hypoxemic respiratory failure.  Possible hypersensitivity pneumonitis by imaging. Follows with Dr. Geronimo, has not been on steroids or treatment for ILD.  She has OSA and uses CPAP at night.  She has a history of HFpEF, last echo showed EF 60-65% with normal RV in 6/23. RHCs done in 7/23 and 10/23 both showed primarily pulmonary venous hypertension, filling pressures significantly elevated in 7/23 but better-controlled in 10/23.    Echo in 2/25 showed EF 55-60%, normal RV, PA systolic pressure 37 mmHg, IVC normal.  RHC in 2/25 showed PA 34/10 with mean PCWP 7, CI 2.75, PVR 2.2 WU.   Patient returns for followup of CHF.  Main complaint today is low back pain, planning steroid injection.  Weight is stable. She is wearing 4L home oxygen . No dyspnea walking on flat ground and able to get around the grocery store.  No palpitations.  She takes occasional metolazone .   ECG (personally reviewed): NSR, 1st degree AVB, QTc 468 msec  Labs (9/24): LDL 42 Labs (11/24): pro-BNP 853, K 4.5, creatinine 1.48 Labs (1/25): LDL 45, BNP 88, K 4.9, creatinine 1.25  PMH: 1. Type 2 diabetes 2. CAD: BMS to RCA in 2004.  - LHC (11/18): Patent RCA stent, CTO acute marginal branch 3. Atrial fibrillation: Paroxysmal.  S/p AF ablation x 3 in 8/19, 7/20, 1/22.  Prior amiodarone  use.  4. OSA 5. Tachy-brady syndrome: Abbott dual  chamber PPM.  6. CKD stage 3 7. Chronic hypoxemic respiratory failure: On home oxygen  since 2020.  Suspect due to ILD, possible hypersensitivity pneumonitis.  - CCP antibody positive.  - HRCT chest (4/24): Scarring from prior infection versus ILD, possible hypersensitivity pneumonitis with upper lobe predominance.  - Has seen Dr. Geronimo, not on steroids or Ofev.  8. Cirrhosis by imaging.  9. HFpEF: Echo (6/23) with EF 60-65%, normal RV.  - RHC (7/23):mean RA 14, PA 63/24 mean 45, mean PCWP 25, CI 3.5, PVR 2.6.  - RHC (10/23): mean RA 4, PA 50/24 mean 35, mean PCWP 16, CI 2.99, PVR 2.97 WU - Echo (2/25): EF 55-60%, normal RV, PASP 37 mmHg, IVC normal. - RHC (2/25): mean RA 4, PA 34/10 mean 19, mean PCWP 7, CI 2.75, PVR 2.2 WU 10. Type 2 diabetes  Social History   Socioeconomic History   Marital status: Married    Spouse name: Financial risk analyst   Number of children: 0   Years of education: HS   Highest education level: High school graduate  Occupational History   Occupation: retired    Comment: previously worked [employer]  Tobacco Use   Smoking status: Former    Current packs/day: 0.00    Average packs/day: 1 pack/day for 5.0 years (5.0 ttl pk-yrs)    Types: Cigarettes    Start date: 05/11/1978    Quit date: 07/03/1982    Years since quitting: 41.7   Smokeless tobacco: Never  Tobacco comments:    Former smoker 02/26/24  Vaping Use   Vaping status: Never Used  Substance and Sexual Activity   Alcohol  use: Not Currently   Drug use: No   Sexual activity: Not Currently  Other Topics Concern   Not on file  Social History Narrative   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   Married    Originally from [place]    Household of two, high school education   Former smoker Therapist, nutritional dogs 7    Retired from [employer]   [id number]   [employer] 2    Social Drivers of [employer]: Low Risk  (01/21/2024)   Overall Financial  Resource Strain (CARDIA)    Difficulty of Paying Living Expenses: Not hard at all  Food Insecurity: No Food Insecurity (02/01/2024)   Hunger Vital Sign    Worried About Running Out of Food in the Last Year: Never true    Ran Out of Food in the Last Year: Never true  Transportation Needs: No Transportation Needs (02/01/2024)   PRAPARE - Administrator, Civil Service (Medical): No    Lack of Transportation (Non-Medical): No  Physical Activity: Insufficiently Active (01/21/2024)   Exercise Vital Sign    Days of Exercise per Week: 3 days    Minutes of Exercise per Session: 20 min  Stress: No Stress Concern Present (01/21/2024)   [employer] of Occupational Health - Occupational Stress Questionnaire    Feeling of Stress: Not at all  Social Connections: Unknown (01/30/2024)   Social Connection and Isolation Panel    Frequency of Communication with Friends and Family: More than three times a week    Frequency of Social Gatherings with Friends and Family: More than three times a week    Attends Religious Services: Not on file    Active Member of Clubs or Organizations: Not on file    Attends Banker Meetings: Not on file    Marital Status: Not on file  Intimate Partner Violence: Not At Risk (02/01/2024)   Humiliation, Afraid, Rape, and Kick questionnaire    Fear of Current or Ex-Partner: No    Emotionally Abused: No    Physically Abused: No    Sexually Abused: No   Family History  Problem Relation Age of Onset   Suicidality Father        suicide death pt was 3 yrs, 1950   Arrhythmia Mother    Hypertension Mother    Diabetes Mother    Dementia Mother    Heart attack Brother    Heart disease Paternal Aunt    Prostate cancer Maternal Grandfather    Diabetes Paternal Grandfather        fathers side of the family   Colon cancer Maternal Aunt    ROS: All systems reviewed and negative except as per HPI.   Current Outpatient Medications  Medication Sig Dispense  Refill   apixaban  (ELIQUIS ) 5 MG TABS tablet Take 1 tablet (5 mg total) by mouth 2 (two) times daily. 180 tablet 1   atorvastatin  (LIPITOR) 40 MG tablet Take 1 tablet (40 mg total) by mouth daily. 90 tablet 3   Continuous Glucose Receiver (DEXCOM G7 RECEIVER) DEVI Use as directed while on insulin  1 each 3   Continuous Glucose Sensor (DEXCOM G7 SENSOR) MISC Apply 1 sensor to skin as directed to monitor blood sugars. Replace with new sensor every 10 days. 3 each  11   diltiazem  (CARDIZEM  LA) 360 MG 24 hr tablet Take 1 tablet (360 mg total) by mouth daily. 90 tablet 3   dofetilide  (TIKOSYN ) 250 MCG capsule Take 1 capsule (250 mcg total) by mouth 2 (two) times daily with breakfast and at bedtime. 180 capsule 3   DULoxetine  (CYMBALTA ) 60 MG capsule Take 1 capsule (60 mg total) by mouth daily. 90 capsule 3   empagliflozin  (JARDIANCE ) 25 MG TABS tablet Take 1 tablet (25 mg total) by mouth daily before breakfast. 90 tablet 0   ezetimibe  (ZETIA ) 10 MG tablet Take 1 tablet (10 mg total) by mouth daily. 90 tablet 3   insulin  degludec (TRESIBA  FLEXTOUCH) 100 UNIT/ML FlexTouch Pen Inject 28 Units into the skin daily as directed 9 mL 3   loratadine  (CLARITIN ) 10 MG tablet TAKE 1 TABLET BY MOUTH EVERY DAY 90 tablet 0   metFORMIN  (GLUCOPHAGE ) 1000 MG tablet Take 1 tablet (1,000 mg total) by mouth 2 (two) times daily. 180 tablet 3   metolazone  (ZAROXOLYN ) 2.5 MG tablet Take 1 tablet (2.5 mg total) by mouth as directed. Weekly as needed for weight gain of 3 lb in 24 hours, 5 lb in a week or leg swelling 25 tablet 2   metoprolol  tartrate (LOPRESSOR ) 50 MG tablet Take 1 tablet (50 mg total) by mouth every morning AND 2 tablets (100 mg total) every evening. 270 tablet 3   nitroGLYCERIN  (NITROSTAT ) 0.4 MG SL tablet Place 1 tablet (0.4 mg total) under the tongue every 5 (five) minutes x 3 doses as needed for chest pain. 25 tablet 3   nystatin -triamcinolone  (MYCOLOG II) cream Apply topically 2 (two) times daily as directed.  30 g 1   omeprazole  (PRILOSEC) 20 MG capsule Take 1 capsule (20 mg total) by mouth 2 (two) times daily before breakfast and at bedtime. 60 capsule 3   ondansetron  (ZOFRAN -ODT) 4 MG disintegrating tablet Take 1 tablet (4 mg total) by mouth every 8 (eight) hours as needed for nausea or vomiting. 20 tablet 1   OXYGEN  Inhale 4 L/min into the lungs continuous.     Polyethyl Glycol-Propyl Glycol (SYSTANE) 0.4-0.3 % SOLN Place 1 drop into both eyes daily as needed (dry eyes).     spironolactone  (ALDACTONE ) 25 MG tablet Take 0.5 tablets (12.5 mg total) by mouth daily. 45 tablet 2   tirzepatide  (MOUNJARO ) 15 MG/0.5ML Pen Inject 15 mg into the skin once a week. 2 mL 11   torsemide  (DEMADEX ) 20 MG tablet Take 3 tablets (60 mg total) by mouth daily before breakfast 270 tablet 3   UNABLE TO FIND Inhale 1 Device into the lungs at bedtime. CPAP     valACYclovir  (VALTREX ) 1000 MG tablet TAKE 2 TABLETS BY MOUTH EVERY 12 HOURS AS NEEDED 34 tablet 2   No current facility-administered medications for this visit.   There were no vitals taken for this visit. General: NAD Neck: No JVD, no thyromegaly or thyroid  nodule.  Lungs: Mild crackles at bases.  CV: Nondisplaced PMI.  Heart regular S1/S2, no S3/S4, no murmur.  No peripheral edema.  No carotid bruit.  Normal pedal pulses.  Abdomen: Soft, nontender, no hepatosplenomegaly, no distention.  Skin: Intact without lesions or rashes.  Neurologic: Alert and oriented x 3.  Psych: Normal affect. Extremities: No clubbing or cyanosis.  HEENT: Normal.   Assessment/Plan: 1. Chronic diastolic CHF: Last echo in 2/25 showed EF 55-60%, normal RV. RHCs in 7/23 and 10/23 showed primarily pulmonary venous hypertension, RHC in 2/25 with good diuresis  showed normal PA pressure and normal filling pressures.  NYHA class II symptoms.  I suspect that her dyspnea is likely multifactorial => CHF, hypersensitivity pneumonitis/ILD, deconditioning. She does not look significantly volume  overloaded on exam today.  - Continue torsemide  60 mg daily.  BMET/BNP today.  2. Chronic hypoxemic respiratory failure: She has been on home oxygen  since 2020.  Suspect hypersensitivity pneumonitis based on HRCT chest in 4/24.  She follows with Dr. Geronimo for pulmonary.  3. OSA: Continue CPAP.  4. Atrial fibrillation: Paroxysmal.  Has had 3 AF ablations.  Currently on Tikosyn .  - Continue Tikosyn , QTc ok on ECG today.  - Continue apixaban .  5. CAD: No chest pain.  - Continue atorvastatin , good lipids in 1/25.   - On apixaban  so no ASA.  6. Tachy-brady syndrome: Has Abbott PPM.   Followup in 6 months with APP.   I spent 31 minutes reviewing records, interviewing/examining patient, and managing orders.   Harlene HERO [facility] 03/18/2024

## 2024-03-19 ENCOUNTER — Telehealth (HOSPITAL_COMMUNITY): Payer: Self-pay

## 2024-03-19 NOTE — Telephone Encounter (Signed)
 Called to confirm/remind patient of their appointment at the [facility] on 03/20/24.   Appointment:   [] Confirmed  [x] Left mess   [] No answer/No voice mail  [] VM Full/unable to leave message  [] Phone not in service  And to bring in all medications and/or complete list.

## 2024-03-20 ENCOUNTER — Other Ambulatory Visit: Payer: Self-pay | Admitting: Internal Medicine

## 2024-03-20 ENCOUNTER — Encounter (HOSPITAL_COMMUNITY): Payer: Self-pay

## 2024-03-20 ENCOUNTER — Ambulatory Visit (HOSPITAL_COMMUNITY)
Admission: RE | Admit: 2024-03-20 | Discharge: 2024-03-20 | Disposition: A | Discharge status: Home / Self Care | Source: Ambulatory Visit | Attending: Family Medicine | Admitting: Family Medicine

## 2024-03-20 ENCOUNTER — Other Ambulatory Visit (HOSPITAL_COMMUNITY): Payer: Self-pay

## 2024-03-20 VITALS — BP 136/82 | HR 57 | Ht 66.5 in | Wt 199.8 lb

## 2024-03-20 DIAGNOSIS — I272 Pulmonary hypertension, unspecified: Secondary | ICD-10-CM | POA: Diagnosis not present

## 2024-03-20 DIAGNOSIS — I480 Paroxysmal atrial fibrillation: Secondary | ICD-10-CM | POA: Diagnosis not present

## 2024-03-20 DIAGNOSIS — I5032 Chronic diastolic (congestive) heart failure: Secondary | ICD-10-CM | POA: Diagnosis not present

## 2024-03-20 DIAGNOSIS — Z794 Long term (current) use of insulin: Secondary | ICD-10-CM | POA: Insufficient documentation

## 2024-03-20 DIAGNOSIS — E1122 Type 2 diabetes mellitus with diabetic chronic kidney disease: Secondary | ICD-10-CM | POA: Insufficient documentation

## 2024-03-20 DIAGNOSIS — Z87891 Personal history of nicotine dependence: Secondary | ICD-10-CM | POA: Diagnosis not present

## 2024-03-20 DIAGNOSIS — J9611 Chronic respiratory failure with hypoxia: Secondary | ICD-10-CM | POA: Diagnosis not present

## 2024-03-20 DIAGNOSIS — G4733 Obstructive sleep apnea (adult) (pediatric): Secondary | ICD-10-CM | POA: Diagnosis not present

## 2024-03-20 DIAGNOSIS — N183 Chronic kidney disease, stage 3 unspecified: Secondary | ICD-10-CM | POA: Diagnosis not present

## 2024-03-20 DIAGNOSIS — Z7985 Long-term (current) use of injectable non-insulin antidiabetic drugs: Secondary | ICD-10-CM | POA: Insufficient documentation

## 2024-03-20 DIAGNOSIS — R609 Edema, unspecified: Secondary | ICD-10-CM | POA: Insufficient documentation

## 2024-03-20 DIAGNOSIS — Z79899 Other long term (current) drug therapy: Secondary | ICD-10-CM | POA: Insufficient documentation

## 2024-03-20 DIAGNOSIS — I495 Sick sinus syndrome: Secondary | ICD-10-CM | POA: Diagnosis not present

## 2024-03-20 DIAGNOSIS — I2510 Atherosclerotic heart disease of native coronary artery without angina pectoris: Secondary | ICD-10-CM | POA: Diagnosis not present

## 2024-03-20 DIAGNOSIS — Z7984 Long term (current) use of oral hypoglycemic drugs: Secondary | ICD-10-CM | POA: Diagnosis not present

## 2024-03-20 DIAGNOSIS — Z9981 Dependence on supplemental oxygen: Secondary | ICD-10-CM | POA: Diagnosis not present

## 2024-03-20 DIAGNOSIS — Z7901 Long term (current) use of anticoagulants: Secondary | ICD-10-CM | POA: Diagnosis not present

## 2024-03-20 DIAGNOSIS — E1165 Type 2 diabetes mellitus with hyperglycemia: Secondary | ICD-10-CM

## 2024-03-20 MED ORDER — EMPAGLIFLOZIN 25 MG PO TABS
25.0000 mg | ORAL_TABLET | Freq: Every day | ORAL | 0 refills | Status: DC
  Filled 2024-04-05: qty 90, 90d supply, fill #0

## 2024-03-20 NOTE — Patient Instructions (Signed)
   Follow-Up in: 6 months with Dr. Mclean with an Echo PLEASE CALL OUR OFFICE AROUND January 2026 TO GET [facility] FOR YOUR March 2026 APPOINTMENT. PHONE NUMBER IS [phone] OPTION 2   At the [facility], you and your health needs are our priority. We have a designated team specialized in the treatment of Heart Failure. This Care Team includes your primary Heart Failure Specialized Cardiologist (physician), Advanced Practice Providers (APPs- Physician Assistants and Nurse Practitioners), and Pharmacist who all work together to provide you with the care you need, when you need it.   You may see any of the following providers on your designated Care Team at your next follow up:  Dr. Toribio Fuel Dr. Ezra Shuck Dr. Ria Commander Dr. Odis Brownie Greig Mosses, NP Caffie Shed, [place] [facility] [place], [place] Beckey Coe, NP [place] Lee, NP Tinnie Redman, PharmD   Please be sure to bring in all your medications bottles to every appointment.   Need to Contact Us :  If you have any questions or concerns before your next appointment please send us  a message through [place] or call our office at [phone].    TO LEAVE A MESSAGE FOR THE NURSE SELECT OPTION 2, PLEASE LEAVE A MESSAGE INCLUDING: YOUR NAME DATE OF BIRTH CALL BACK NUMBER REASON FOR CALL**this is important as we prioritize the call backs  YOU WILL RECEIVE A CALL BACK THE SAME DAY AS LONG AS YOU CALL BEFORE 4:00 PM

## 2024-03-21 ENCOUNTER — Ambulatory Visit (HOSPITAL_COMMUNITY): Payer: Self-pay | Admitting: Family Medicine

## 2024-03-24 ENCOUNTER — Encounter (HOSPITAL_COMMUNITY)

## 2024-03-25 ENCOUNTER — Other Ambulatory Visit: Payer: Self-pay | Admitting: Internal Medicine

## 2024-03-25 ENCOUNTER — Other Ambulatory Visit (HOSPITAL_COMMUNITY): Payer: Self-pay

## 2024-03-25 DIAGNOSIS — E1169 Type 2 diabetes mellitus with other specified complication: Secondary | ICD-10-CM

## 2024-03-25 MED ORDER — MOUNJARO 15 MG/0.5ML ~~LOC~~ SOAJ
15.0000 mg | SUBCUTANEOUS | 11 refills | Status: AC
  Filled 2024-03-25: qty 2, 28d supply, fill #0
  Filled 2024-04-22: qty 2, 28d supply, fill #1
  Filled 2024-05-21: qty 2, 28d supply, fill #2
  Filled 2024-06-18: qty 2, 28d supply, fill #3
  Filled 2024-07-16: qty 2, 28d supply, fill #4

## 2024-04-01 ENCOUNTER — Other Ambulatory Visit (HOSPITAL_COMMUNITY): Payer: Self-pay

## 2024-04-02 NOTE — Progress Notes (Unsigned)
 Subjective:     Patient ID: [name], female   DOB: Jun 15, 1946,    MRN: [id number]  HPI  F quit smoking in 1984 followed previously by [facility] for osa on CPAP since 2010 NPSG 2010- AHI 21/ hr Hypersensitivity Pneumonia panel 12/29/2021 positive for Aspergillus fumigatus antibodies PFT 04/14/22- Moderate restriction, Increased Diffusion =============================================================   08/10/22- 78 year old female former smoker followed for OSA, complicated by Chronic Hypoxic Resp Failure,    ( Dr Geronimo), Hypersensitivity Pneumonia, morbid Obesity,  CAD/stents/ MI/CHF, PAfib/ pacemaker, dCHF,  history CVA, WHO3 Pulmonary Hypertension, CKD3,  -albuterol  hfa,  O2 3L / [facility] new hosp 05/04/22    CPAP  5-15/ [facility]     [employer] now called [facility] (was Radiation protection practitioner)  Body weight today- 231 lbs Download compliance-  96%, AHI 0.5/ hr         Covid vax-5 Phizer RSV vax- had Flu vax-had Download reviewed.  She is comfortable with her CPAP machine and has been using it for many years.  Continues to meet goals breathe better lying supine if she wears CPAP.  She did have questions about [employer] which I was happy to answer for her.  I think she may be too heavy, but I told her we could refer her to explore the options further if she wished. She continues with oxygen  and arrives wearing POC 3 L pulse. CXR 04/18/22- IMPRESSION: No active cardiopulmonary disease.  04/04/23- 78 year old female former smoker followed for OSA, complicated by Chronic Hypoxic Resp Failure, ( Dr Geronimo), Hypersensitivity Pneumonia, morbid Obesity,  CAD/stents/ MI/CHF, PAfib/ Pacemaker, dCHF,  history CVA, WHO3 Pulmonary Hypertension, CKD3,  -albuterol  hfa,  O2 3L / [facility] new hosp 05/04/22    CPAP  5-15/ [facility]     [employer] now called [facility]  Body weight today-  Download compliance-          ROS-see HPI  + = positive Constitutional:    weight loss,  night sweats, fevers, chills, fatigue, lassitude. HEENT:    headaches, difficulty swallowing, tooth/dental problems, sore throat,       sneezing, itching, ear ache, nasal congestion, post nasal drip, snoring CV:    chest pain, orthopnea, PND, swelling in lower extremities, anasarca,                                                    dizziness, palpitations Resp:   shortness of breath with exertion or at rest.                productive cough,   non-productive cough, coughing up of blood.              change in color of mucus.  wheezing.   Skin:    rash or lesions. GI:  No-   heartburn, indigestion, abdominal pain, nausea, vomiting, diarrhea,                 change in bowel habits, loss of appetite GU: dysuria, change in color of urine, no urgency or frequency.   flank pain. MS:   joint pain, stiffness, decreased range of motion, back pain. Neuro-     nothing unusual Psych:  change in mood or affect.  depression or anxiety.   memory loss.  OBJ- Physical Exam General- Alert, Oriented, Affect-appropriate,  Distress- none acute, + obese, O2 POC 3L pulse Skin- rash-none, lesions- none, excoriation- none Lymphadenopathy- none Head- atraumatic            Eyes- Gross vision intact, PERRLA, conjunctivae and secretions clear            Ears- Hearing, canals-normal            Nose- Clear, no-Septal dev, mucus, polyps, erosion, perforation             Throat- Mallampati II , mucosa clear , drainage- none, tonsils- atrophic Neck- flexible , trachea midline, no stridor , thyroid  nl, carotid no bruit Chest - symmetrical excursion , unlabored           Heart/CV- RRR , no murmur , no gallop  , no rub, nl s1 s2                           - JVD- none , edema- none, stasis changes- none, varices- none           Lung- clear to P&A, wheeze- none, cough- none , dullness-none, rub- none           Chest wall-+  pacemaker left Abd-  Br/ Gen/ Rectal- Not done, not indicated Extrem- cyanosis- none, clubbing, none,  atrophy- none, strength- nl Neuro- grossly intact to observation    Assessment:

## 2024-04-03 ENCOUNTER — Ambulatory Visit (INDEPENDENT_AMBULATORY_CARE_PROVIDER_SITE_OTHER): Admitting: Internal Medicine

## 2024-04-03 ENCOUNTER — Encounter: Payer: Self-pay | Admitting: Internal Medicine

## 2024-04-03 VITALS — BP 106/60 | HR 52 | Temp 98.6°F | Ht 66.0 in | Wt 205.0 lb

## 2024-04-03 DIAGNOSIS — G4733 Obstructive sleep apnea (adult) (pediatric): Secondary | ICD-10-CM | POA: Diagnosis not present

## 2024-04-03 DIAGNOSIS — Z23 Encounter for immunization: Secondary | ICD-10-CM

## 2024-04-03 NOTE — Patient Instructions (Signed)
 Order- flu vax senior  Glad you are doing well with CPAP- you can continue as you are doing with CPAP and oxygen .  We did suggest you try turning off the oxygen  while sitting.

## 2024-04-04 ENCOUNTER — Encounter: Payer: Self-pay | Admitting: Internal Medicine

## 2024-04-05 ENCOUNTER — Other Ambulatory Visit (HOSPITAL_COMMUNITY): Payer: Self-pay

## 2024-04-09 DIAGNOSIS — Z981 Arthrodesis status: Secondary | ICD-10-CM | POA: Diagnosis not present

## 2024-04-09 DIAGNOSIS — M51369 Other intervertebral disc degeneration, lumbar region without mention of lumbar back pain or lower extremity pain: Secondary | ICD-10-CM | POA: Diagnosis not present

## 2024-04-09 DIAGNOSIS — M533 Sacrococcygeal disorders, not elsewhere classified: Secondary | ICD-10-CM | POA: Diagnosis not present

## 2024-04-09 NOTE — Progress Notes (Unsigned)
 Cardiology Office Note:    Date:  04/10/2024   ID:  [name], DOB Feb 08, 1946, MRN [id number]  PCP:  Charlett Apolinar POUR, MD   [facility] Providers Cardiologist:  Stanly DELENA Leavens, MD Electrophysiologist:  Soyla Gladis Norton, MD     Referring MD: Charlett Apolinar POUR, MD   Chief complaint: 26-month follow-up for chronic diastolic heart failure  History of Present Illness:    [name] is a 78 y.o. female with a hx of chronic diastolic CHF, CAD s/p BMS to the RCA in 2004, paroxysmal atrial fibrillation s/p A-fib ablation x 3 with multiple cardioversions, now on Tikosyn , tachybradycardia syndrome s/p PPM, OSA, obesity, type II DM, CKD stage IIIb.   In 2018 patient underwent RHC to evaluate dyspnea which showed RA mean 13, PA 76/29, PCWP mean 33, LVEDP 21, Fick CO/CI 8.47/3.87, PVR 3 [place].  Due to discrepancy in PCWP and LVEDP, CTA heart was recommended to rule out pulmonary vein stenosis given history of PV isolation.  Cardiac CTA with no evidence of pulmonary vein stenosis but did show atypical left pulmonary vein drainage into left atrium.  Patient was hospitalized in 2021 due to respiratory failure secondary to RSV and bronchitis. She was found to be in AF with RVR in the setting of respiratory infection. Patient underwent a fourth ablation on 07/07/2020 with additional cardioversion to sinus rhythm.    Patient was admitted in 12/2021 for acute hypoxic respiratory failure due to acute on chronic HFpEF and possible ILD.  She was diuresed with IV Lasix  and transition to p.o. torsemide .  Echo indicated EF 60 to 65%, RV was okay with no significant valve disease.  HRCT did show groundglass opacities and air trapping also noted on prior scans.  Pulmonary was consulted.  Some findings on imaging appeared suggestive chronic hypersensitivity pneumonitis.  Outpatient follow-up with pulmonology was recommended.  She was discharged on supplemental oxygen .  Patient had a repeat RHC on 01/04/2022  which showed elevated filling pressures.  Torsemide  was increased to 60 mg daily. RHC 05/02/22 with RA 4, PA 50/24 with mean 35, PCWP 16, CO 6.4, CI 2.99, PVR 2.97 WU. Recommended for continued medical therapy with torsemide  and jardiance .   Evaluated by Dr. Rolan 08/03/2023 who repeated right heart cath following increasing fatigue and shortness of breath to the point where it was affecting her showering getting dressed, requiring frequent rest.  Echo 08/17/2023: 55-60%, normal LV function, no RWMA, indeterminate diastolic parameters.  Normal RV.  Mildly elevated PASP.  RVSP 36.6 mmHg.  Mild mitral valve regurgitation.  Normal aortic valve.  RA pressure 3 mmHg.  RHC 08/21/2023: RA 4, RV 34/2, PA 34/10, PCWP 7, CO 5.39, CI 2.75.  Presented to the ED 01/29/2024 with chest pressure, sinus bradycardia in the 40s.  Received a GEN change out for her pacemaker with improvement in symptoms.  Last seen by advanced heart failure clinic on 03/20/2024, was stable from a cardiovascular standpoint at that time.  Echo ordered at that time, not yet complete.  They recommended follow-up with them/Dr. Rolan in 6 months.  Patient presents to the clinic alone today, doing well from a cardiac standpoint. She denies chest pain, palpitations, orthopnea, n, v, dark/tarry/bloody stools, hematuria, syncope, edema, weight gain.  Last took her metolazone  Sunday, went from 205 lbs to 193 lbs overnight.  Was able to breathe easier, states this is normal for her and has to do this every couple weeks.  Denies excessive bleeding/bruising while on her Eliquis ,  denies missing any doses.  Reports occasional dizziness with positional changes, is mindful of this and changes positions slowly, does not happen often.  Reports nightly adherence to her CPAP, otherwise she has difficulty sleeping.  Mostly complaining of lower back pain extending across entire lower back, occasionally extending up to mid back. Saw her PCP for this yesterday, they decided  it would be best to speak with cardiology for potential pain management considering patient is not a candidate to back surgery secondary to her ILD.    Past Medical History:  Diagnosis Date   Anticoagulant long-term use    pradaxa    Anxiety    Arthritis    fingers, lower back (04/23/2017    CAD (coronary artery disease) 8010,7996   March 1984; post PTCA with bare-metal stenting to mid RCA in December 2004   CHF (congestive heart failure) (HCC)    Chronic atrial fibrillation (HCC) 06/2007   Tachybradycardia pacemaker   Chronic kidney disease    10% function - ?R, other kidney is compensating     CVA (cerebral vascular accident) [facility]) [phone]   denies residual on 04/23/2017   Depression    Diplopia 06/19/2008   Qualifier: Diagnosis of  By: Charlett MD, Apolinar POUR    Dysrhythmia    ATRIAL FIBRILATION   Edema of lower extremity    Hyperlipidemia    Hypertension    1981   Inferior myocardial infarction [facility])    acute inferior wall mi/other medical hx   Myocardial infarction (HCC)    1984, [id number]   Obesity    OSA on CPAP    last test- 2010   Pacemaker    Pneumonia 2014   tx. ----  [facility]   Pulmonary hypertension (HCC)    moderate pulmonary hypertension by 10/2016 echo and 10/2013 cardiac cath   Shortness of breath    Skin cancer    cut off right [place]; burned off LLE (04/23/2017)   Sleep apnea 1981   Spondylolisthesis    TIA (transient ischemic attack) 2008   Unspecified hemorrhoids without mention of complication 07/15/2003   Colonoscopy--Dr. Avram     Past Surgical History:  Procedure Laterality Date   ABDOMINAL HYSTERECTOMY     APPENDECTOMY  1984   ATRIAL FIBRILLATION ABLATION N/A 09/29/2016   Procedure: Atrial Fibrillation Ablation;  Surgeon: Will Gladis Norton, MD;  [facility]: [facility] INVASIVE CV LAB;  Service: Cardiovascular;  Laterality: N/A;   ATRIAL FIBRILLATION ABLATION N/A 02/07/2018   Procedure: ATRIAL FIBRILLATION ABLATION;  Surgeon: Norton Soyla Gladis, MD;  Location: [facility] INVASIVE CV LAB;  Service: Cardiovascular;  Laterality: N/A;   ATRIAL FIBRILLATION ABLATION N/A 03/13/2019   Procedure: ATRIAL FIBRILLATION ABLATION;  Surgeon: Norton Soyla Gladis, MD;  [facility]: [facility] CV LAB;  Service: Cardiovascular;  Laterality: N/A;   ATRIAL FIBRILLATION ABLATION N/A 07/07/2020   Procedure: ATRIAL FIBRILLATION ABLATION;  Surgeon: Norton Soyla Gladis, MD;  [facility]: [facility] CV LAB;  Service: Cardiovascular;  Laterality: N/A;   BACK SURGERY     CARDIOVERSION N/A 09/12/2017   Procedure: CARDIOVERSION;  Surgeon: Jeffrie Oneil BROCKS, MD;  Location: [facility] ENDOSCOPY;  Service: Cardiovascular;  Laterality: N/A;   CARDIOVERSION N/A 12/13/2017   Procedure: CARDIOVERSION;  Surgeon: Francyne Headland, MD;  Location: [facility] ENDOSCOPY;  Service: Cardiovascular;  Laterality: N/A;   CARDIOVERSION N/A 02/18/2018   Procedure: CARDIOVERSION;  Surgeon: Maranda Leim DEL, MD;  Location: [facility] ENDOSCOPY;  Service: Cardiovascular;  Laterality: N/A;   CARDIOVERSION N/A 05/20/2018   Procedure: CARDIOVERSION;  Surgeon:  Shlomo Wilbert SAUNDERS, MD;  Location: [facility] ENDOSCOPY;  Service: Cardiovascular;  Laterality: N/A;   CARDIOVERSION N/A 04/16/2019   Procedure: CARDIOVERSION;  Surgeon: Kate Lonni CROME, MD;  Location: [facility] ENDOSCOPY;  Service: Endoscopy;  Laterality: N/A;   CARDIOVERSION N/A 01/22/2020   Procedure: CARDIOVERSION;  Surgeon: Barbaraann Darryle Ned, MD;  Location: [facility] ENDOSCOPY;  Service: Cardiovascular;  Laterality: N/A;   CARDIOVERSION N/A 02/18/2020   Procedure: CARDIOVERSION;  Surgeon: Loni Soyla LABOR, MD;  Location: [facility] ENDOSCOPY;  Service: Cardiovascular;  Laterality: N/A;   CATARACT EXTRACTION W/ INTRAOCULAR LENS  IMPLANT, BILATERAL Bilateral 01/15/2017- 03/2017   CHOLECYSTECTOMY     CORONARY ANGIOPLASTY  X 2   CORONARY ANGIOPLASTY WITH STENT PLACEMENT  1998; ~ 2007; ?date   1 stent; replaced stent; not sure when I got the last stent (04/23/2017)   DOPPLER  ECHOCARDIOGRAPHY  2009   ELECTROPHYSIOLOGIC STUDY N/A 03/31/2016   Procedure: Cardioversion;  Surgeon: Danelle LELON Birmingham, MD;  [facility];  Service: Cardiovascular;  Laterality: N/A;   ELECTROPHYSIOLOGIC STUDY N/A 08/04/2016   Procedure: Cardioversion;  Surgeon: Danelle LELON Birmingham, MD;  Location: [facility] INVASIVE CV LAB;  Service: Cardiovascular;  Laterality: N/A;   INSERT / REPLACE / REMOVE PACEMAKER  06/2007   IR RADIOLOGY PERIPHERAL GUIDED IV START  01/31/2018   IR US  GUIDE VASC ACCESS LEFT  01/31/2018   JOINT REPLACEMENT     LAPAROSCOPIC CHOLECYSTECTOMY  1994   LEFT AND RIGHT HEART CATHETERIZATION WITH CORONARY ANGIOGRAM N/A 10/06/2013   Procedure: LEFT AND RIGHT HEART CATHETERIZATION WITH CORONARY ANGIOGRAM;  Surgeon: Ned LABOR Sor, MD;  Location: [facility] CATH LAB;  Service: Cardiovascular;  Laterality: N/A;   LEFT OOPHORECTOMY Left ~ 1989   POSTERIOR LUMBAR FUSION  2000s - 04/2015 X 3   L3-4; L4-5; L2-3; Dr Malcolm   [facility] GENERATOR CHANGEOUT N/A 01/30/2024   Procedure: PPM GENERATOR CHANGEOUT;  Surgeon: Birmingham Danelle LELON, MD;  Location: [facility] INVASIVE CV LAB;  Service: Cardiovascular;  Laterality: N/A;   RIGHT HEART CATH N/A 01/04/2022   Procedure: RIGHT HEART CATH;  Surgeon: Rolan Ezra RAMAN, MD;  Location: [facility] INVASIVE CV LAB;  Service: Cardiovascular;  Laterality: N/A;   RIGHT HEART CATH N/A 05/02/2022   Procedure: RIGHT HEART CATH;  Surgeon: Rolan Ezra RAMAN, MD;  Location: [facility] INVASIVE CV LAB;  Service: Cardiovascular;  Laterality: N/A;   RIGHT HEART CATH N/A 08/21/2023   Procedure: RIGHT HEART CATH;  Surgeon: Rolan Ezra RAMAN, MD;  Location: [facility] INVASIVE CV LAB;  Service: Cardiovascular;  Laterality: N/A;   RIGHT/LEFT HEART CATH AND CORONARY ANGIOGRAPHY N/A 05/09/2017   Procedure: RIGHT/LEFT HEART CATH AND CORONARY ANGIOGRAPHY;  Surgeon: Wonda Sharper, MD;  Location: [facility] INVASIVE CV LAB;  Service: Cardiovascular;  Laterality: N/A;   SKIN CANCER EXCISION Right    [place]   TEE WITHOUT CARDIOVERSION N/A  09/29/2016   Procedure: TRANSESOPHAGEAL ECHOCARDIOGRAM (TEE);  Surgeon: Oneil JAYSON Parchment, MD;  Location: [facility] ENDOSCOPY;  Service: Cardiovascular;  Laterality: N/A;   TOTAL ABDOMINAL HYSTERECTOMY  1984   uterus & right ovary   TOTAL KNEE ARTHROPLASTY Left 04/23/2017   TOTAL KNEE ARTHROPLASTY Left 04/23/2017   Procedure: TOTAL KNEE ARTHROPLASTY;  Surgeon: Liam Lerner, MD;  Location: [facility] OR;  Service: Orthopedics;  Laterality: Left;   ULTRASOUND GUIDANCE FOR VASCULAR ACCESS  05/09/2017   Procedure: Ultrasound Guidance For Vascular Access;  Surgeon: Wonda Sharper, MD;  Location: [facility] INVASIVE CV LAB;  Service: Cardiovascular;;    Current Medications: Current Meds  Medication Sig  apixaban  (ELIQUIS ) 5 MG TABS tablet Take 1 tablet (5 mg total) by mouth 2 (two) times daily.   atorvastatin  (LIPITOR) 40 MG tablet Take 1 tablet (40 mg total) by mouth daily.   Continuous Glucose Receiver (DEXCOM G7 RECEIVER) DEVI Use as directed while on insulin    Continuous Glucose Sensor (DEXCOM G7 SENSOR) MISC Apply 1 sensor to skin as directed to monitor blood sugars. Replace with new sensor every 10 days.   diltiazem  (CARDIZEM  LA) 360 MG 24 hr tablet Take 1 tablet (360 mg total) by mouth daily.   dofetilide  (TIKOSYN ) 250 MCG capsule Take 1 capsule (250 mcg total) by mouth 2 (two) times daily with breakfast and at bedtime.   DULoxetine  (CYMBALTA ) 60 MG capsule Take 1 capsule (60 mg total) by mouth daily.   empagliflozin  (JARDIANCE ) 25 MG TABS tablet Take 1 tablet (25 mg total) by mouth daily before breakfast.   ezetimibe  (ZETIA ) 10 MG tablet Take 1 tablet (10 mg total) by mouth daily.   insulin  degludec (TRESIBA  FLEXTOUCH) 100 UNIT/ML FlexTouch Pen Inject 28 Units into the skin daily as directed   loratadine  (CLARITIN ) 10 MG tablet TAKE 1 TABLET BY MOUTH EVERY DAY   metFORMIN  (GLUCOPHAGE ) 1000 MG tablet Take 1 tablet (1,000 mg total) by mouth 2 (two) times daily.   metolazone  (ZAROXOLYN ) 2.5 MG tablet Take 1 tablet  (2.5 mg total) by mouth as directed. Weekly as needed for weight gain of 3 lb in 24 hours, 5 lb in a week or leg swelling   metoprolol  tartrate (LOPRESSOR ) 50 MG tablet Take 1 tablet (50 mg total) by mouth every morning AND 2 tablets (100 mg total) every evening.   nitroGLYCERIN  (NITROSTAT ) 0.4 MG SL tablet Place 1 tablet (0.4 mg total) under the tongue every 5 (five) minutes x 3 doses as needed for chest pain.   nystatin -triamcinolone  (MYCOLOG II) cream Apply topically 2 (two) times daily as directed.   omeprazole  (PRILOSEC) 20 MG capsule Take 1 capsule (20 mg total) by mouth 2 (two) times daily before breakfast and at bedtime.   ondansetron  (ZOFRAN -ODT) 4 MG disintegrating tablet Take 1 tablet (4 mg total) by mouth every 8 (eight) hours as needed for nausea or vomiting.   OXYGEN  Inhale 4 L/min into the lungs continuous.   Polyethyl Glycol-Propyl Glycol (SYSTANE) 0.4-0.3 % SOLN Place 1 drop into both eyes daily as needed (dry eyes).   spironolactone  (ALDACTONE ) 25 MG tablet Take 0.5 tablets (12.5 mg total) by mouth daily.   tirzepatide  (MOUNJARO ) 15 MG/0.5ML Pen Inject 15 mg into the skin once a week.   torsemide  (DEMADEX ) 20 MG tablet Take 3 tablets (60 mg total) by mouth daily before breakfast   UNABLE TO FIND Inhale 1 Device into the lungs at bedtime. CPAP   valACYclovir  (VALTREX ) 1000 MG tablet TAKE 2 TABLETS BY MOUTH EVERY 12 HOURS AS NEEDED     Allergies:   Hydrocodone -guaifenesin  and Adhesive [tape]   Social History   Socioeconomic History   Marital status: Married    Spouse name: Financial risk analyst   Number of children: 0   Years of education: HS   Highest education level: High school graduate  Occupational History   Occupation: retired    Comment: previously worked [employer]  Tobacco Use   Smoking status: Former    Current packs/day: 0.00    Average packs/day: 1 pack/day for 5.0 years (5.0 ttl pk-yrs)    Types: Cigarettes    Start date: 05/11/1978    Quit date: 07/03/1982  Years since quitting: 41.8   Smokeless tobacco: Never   Tobacco comments:    Former smoker 02/26/24  Vaping Use   Vaping status: Never Used  Substance and Sexual Activity   Alcohol  use: Not Currently   Drug use: No   Sexual activity: Not Currently  Other Topics Concern   Not on file  Social History Narrative   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   Married    Originally from [place]    Household of two, high school education   Former smoker Therapist, nutritional dogs 7    Retired from [employer]   [id number]   [employer] 2    Social Drivers of [employer]: Low Risk  (01/21/2024)   Overall Financial Resource Strain (CARDIA)    Difficulty of Paying Living Expenses: Not hard at all  Food Insecurity: No Food Insecurity (02/01/2024)   Hunger Vital Sign    Worried About Running Out of Food in the Last Year: Never true    Ran Out of Food in the Last Year: Never true  Transportation Needs: No Transportation Needs (02/01/2024)   PRAPARE - Administrator, Civil Service (Medical): No    Lack of Transportation (Non-Medical): No  Physical Activity: Insufficiently Active (01/21/2024)   Exercise Vital Sign    Days of Exercise per Week: 3 days    Minutes of Exercise per Session: 20 min  Stress: No Stress Concern Present (01/21/2024)   [employer] of Occupational Health - Occupational Stress Questionnaire    Feeling of Stress: Not at all  Social Connections: Unknown (01/30/2024)   Social Connection and Isolation Panel    Frequency of Communication with Friends and Family: More than three times a week    Frequency of Social Gatherings with Friends and Family: More than three times a week    Attends Religious Services: Not on Marketing executive or Organizations: Not on file    Attends Banker Meetings: Not on file    Marital Status: Not on file     Family History: The patient's family history includes  Arrhythmia in her mother; Colon cancer in her maternal aunt; Dementia in her mother; Diabetes in her mother and paternal grandfather; Heart attack in her brother; Heart disease in her paternal aunt; Hypertension in her mother; Prostate cancer in her maternal grandfather; Suicidality in her father.  ROS:   Please see the history of present illness.     All other systems reviewed and are negative.  EKGs/Labs/Other Studies Reviewed:    The following studies were reviewed today:  EKG Interpretation Date/Time:  Thursday April 10 2024 09:48:23 EDT Ventricular Rate:  63 PR Interval:  320 QRS Duration:  90 QT Interval:  452 QTC Calculation: 462 R Axis:   77  Text Interpretation: Sinus rhythm with 1st degree A-V block Nonspecific ST abnormality Confirmed by Kauan Kloosterman [phone]) on 04/10/2024 10:34:39 AM    Recent Labs: 05/14/2023: NT-Pro BNP 853 09/26/2023: B Natriuretic Peptide 71.8 01/30/2024: ALT 15; Magnesium  2.4 02/04/2024: BUN 26; Creatinine, Ser 1.08; Hemoglobin 13.1; Platelets 349.0; Potassium 3.8; Sodium 136  Recent Lipid Panel    Component Value Date/Time   CHOL 111 08/03/2023 1013   CHOL 104 10/23/2022 0900   TRIG 190 (H) 08/03/2023 1013   HDL 28 (L) 08/03/2023 1013   HDL 33 (L) 10/23/2022 0900   CHOLHDL 4.0 08/03/2023 1013   VLDL 38 08/03/2023  1013   LDLCALC 45 08/03/2023 1013   LDLCALC 42 10/23/2022 0900   LDLDIRECT 76.0 08/23/2021 0935     Risk Assessment/Calculations:    CHA2DS2-VASc Score = 7   This indicates a 11.2% annual risk of stroke. The patient's score is based upon: CHF History: 1 HTN History: 1 Diabetes History: 1 Stroke History: 0 Vascular Disease History: 1 Age Score: 2 Gender Score: 1               Physical Exam:    VS:  BP 100/67 (BP Location: Left Arm, Patient Position: Sitting, Cuff Size: Normal)   Pulse 63   Ht 5' 6 (1.676 m)   Wt 197 lb (89.4 kg)   SpO2 90%   BMI 31.80 kg/m     Wt Readings from Last 3 Encounters:   04/10/24 197 lb (89.4 kg)  04/03/24 205 lb (93 kg)  03/20/24 199 lb 12.8 oz (90.6 kg)     GEN:  Well nourished, well developed in no acute distress HEENT: Normal NECK: No carotid bruits CARDIAC: RRR, systolic murmur, rubs, gallops RESPIRATORY:  Mild diffuse faint rales on expiration throughout bilateral bases.  MUSCULOSKELETAL:  No edema; No deformity  SKIN: Warm and dry NEUROLOGIC:  Alert and oriented x 3 PSYCHIATRIC:  Normal affect   ASSESSMENT:    1. Chronic diastolic congestive heart failure (HCC)   2. Chronic heart failure with preserved ejection fraction (HFpEF) (HCC)   3. Pulmonary hypertension, unspecified (HCC)   4. PAF (paroxysmal atrial fibrillation) (HCC)   5. BRADYCARDIA-TACHYCARDIA SYNDROME   6. Hyperlipidemia, unspecified hyperlipidemia type   7. ILD (interstitial lung disease) (HCC)   8. OSA (obstructive sleep apnea)   9. Chronic bilateral low back pain, unspecified whether sciatica present    PLAN:    In order of problems listed above:  Chronic diastolic CHF  HFpEF PHTN Chronic resp failure w/ hypoxia Echo 08/17/2023: LVEF 55-60%, normal LV function, no RWMA, indeterminate diastolic parameters.  Normal RV systolic function and size, mildly elevated PASP, RV systolic pressure 36 mmHg.  Mild MV regurgitation.  Normal aortic valve.  RA pressure 3 mmHg Repeat echo ordered by advanced heart failure clinic on 03/20/2024, not yet completed Chronically on 4 L O2 Takes metolazone  every couple weeks, last taken 4 days ago DOE feels unchanged from baseline, not orthopneic as long as she wears her CPAP Expiratory Rales in bilateral lower lobes unchanged from findings 1 month prior at advanced heart failure clinic per documentation Weight 197 today, patient states this is normal for her NYHA Class GDMT: Continue Jardiance  25 mg daily Continue metolazone  2.5 mg weekly as needed weight gain of 3 pounds in 24 hours, 5 pounds in 1 week, or leg swelling Continue  metoprolol  50 mg every morning, with 100 mg every evening Continue spironolactone  12.5 mg daily Continue torsemide  60 mg daily before breakfast We will check a CMP today to reassess kidney   CAD 2004 BMS in RCA, 2018 RCA stent patent, CTO to OM branch w/ collaterals, LAD/LCx patent EKG: Sinus rhythm with first-degree AV block, 63 bpm (PR interval 320 ms) Denies chest pain, palpitations, SOB at rest, near-syncope Continue atorvastatin  40 mg daily Continue metoprolol  50 mg every morning, with 100 mg every evening Continue nitroglycerin  0.4 mg sublingual tablet every 5 minutes as needed for chest pain  PAF Afib ablation x 3, flutter ablation in 2022 EKG as above, sinus rhythm today, PR interval 320 ms, has had issues with it being prolonged in the past.  Will notify her EP doc, Dr. Inocencio, and advised her to keep her appointment with him on 11/11 next month. Continue Eliquis  5 mg twice daily as this is the correct dose for her age, weight, creatinine Continue diltiazem  360 mg daily Continue Tikosyn  to 250 mcg twice daily Continue metoprolol  50 mg every morning, with 100 mg every evening  Tachybrady Syndrome S/P st. Jude dual-chamber PM Last generator change out 01/30/2024  HLD, LDL goal <55  Lipid panel 8/68/7974: Cholesterol 111, HDL 28, LDL 45, triglycerides 190 Continue atorvastatin  40 mg daily Continue Zetia  10 mg daily Will check LFTs on CMP today, fasting lipid panel could be considered at follow-up visit  ILD HRCT chest (4/24): Scarring from prior infection versus ILD, possible hypersensitivity pneumonitis with upper lobe predominance.  Followed by Dr. Geronimo Not on steroids or Ofev  OSA Uses CPAP, reports regular compliance   Chronic lower back pain Saw PCP for this yesterday, has been an ongoing issue Denies fever, dysuria, hematuria Was told yesterday she was not a candidate for spinal surgery secondary to her chronic lung issues Was requesting advice on pain  management options given her complex medical history Discussed case with onsite clinical pharmacist Allean Bong, who recommended start with Voltaren gel.  It's an anti-inflammatory, but we don't absorb topicals the same, so we don't have that high cardioembolic risk.  She can take APAP, although I usually suggest no more than 2,000 mg daily total, to avoid liver toxicity (1,000 mg bid).  She can also try OTC lidocaine  patches to the areas, and things like IcyHot and BenGay Advised patient to try Voltaren gel topically and lidocaine  patches moving forward OTC Will defer to PCP for further management of chronic pain   Follow-up with the EP next month Follow-up with your cardiologist, Dr. Arnetha, in 3 months Follow-up with A-fib clinic and advanced heart clinic as advised     Medication Adjustments/Labs and Tests Ordered: Current medicines are reviewed at length with the patient today.  Concerns regarding medicines are outlined above.  Orders Placed This Encounter  Procedures   Comp Met (CMET)   EKG 12-Lead   No orders of the defined types were placed in this encounter.   Patient Instructions  Medication Instructions:  Your physician recommends that you continue on your current medications as directed. Please refer to the Current Medication list given to you today.  *If you need a refill on your cardiac medications before your next appointment, please call your pharmacy*  Lab Work: CMP today  If you have labs (blood work) drawn today and your tests are completely normal, you will receive your results only by: MyChart Message (if you have MyChart) OR A paper copy in the mail If you have any lab test that is abnormal or we need to change your treatment, we will call you to review the results.  Testing/Procedures: None  Follow-Up: At [facility], you and your health needs are our priority.  As part of our continuing mission to provide you with exceptional heart  care, our providers are all part of one team.  This team includes your primary Cardiologist (physician) and Advanced Practice Providers or APPs (Physician Assistants and Nurse Practitioners) who all work together to provide you with the care you need, when you need it.  Your next appointment:   3 month(s)  Provider:   Stanly DELENA Leavens, MD    We recommend signing up for the patient portal called MyChart.  Sign up information is  provided on this After Visit Summary.  MyChart is used to connect with patients for Virtual Visits (Telemedicine).  Patients are able to view lab/test results, encounter notes, upcoming appointments, etc.  Non-urgent messages can be sent to your provider as well.   To learn more about what you can do with MyChart, go to [email or web address].   Other Instructions Use OTC diclofenac topical gel as needed           Signed, Dae Antonucci E Paizley Ramella, NP  04/10/2024 10:54 AM    [facility]

## 2024-04-10 ENCOUNTER — Ambulatory Visit: Admitting: Internal Medicine

## 2024-04-10 ENCOUNTER — Encounter: Payer: Self-pay | Admitting: Nurse Practitioner

## 2024-04-10 ENCOUNTER — Ambulatory Visit: Attending: Nurse Practitioner | Admitting: Emergency Medicine

## 2024-04-10 VITALS — BP 100/67 | HR 63 | Ht 66.0 in | Wt 197.0 lb

## 2024-04-10 DIAGNOSIS — E785 Hyperlipidemia, unspecified: Secondary | ICD-10-CM | POA: Diagnosis not present

## 2024-04-10 DIAGNOSIS — J849 Interstitial pulmonary disease, unspecified: Secondary | ICD-10-CM | POA: Diagnosis not present

## 2024-04-10 DIAGNOSIS — G4733 Obstructive sleep apnea (adult) (pediatric): Secondary | ICD-10-CM | POA: Diagnosis not present

## 2024-04-10 DIAGNOSIS — I272 Pulmonary hypertension, unspecified: Secondary | ICD-10-CM

## 2024-04-10 DIAGNOSIS — I495 Sick sinus syndrome: Secondary | ICD-10-CM

## 2024-04-10 DIAGNOSIS — I480 Paroxysmal atrial fibrillation: Secondary | ICD-10-CM

## 2024-04-10 DIAGNOSIS — I5032 Chronic diastolic (congestive) heart failure: Secondary | ICD-10-CM | POA: Diagnosis not present

## 2024-04-10 DIAGNOSIS — G8929 Other chronic pain: Secondary | ICD-10-CM | POA: Diagnosis not present

## 2024-04-10 DIAGNOSIS — M545 Low back pain, unspecified: Secondary | ICD-10-CM

## 2024-04-10 NOTE — Patient Instructions (Signed)
 Medication Instructions:  Your physician recommends that you continue on your current medications as directed. Please refer to the Current Medication list given to you today.  *If you need a refill on your cardiac medications before your next appointment, please call your pharmacy*  Lab Work: CMP today  If you have labs (blood work) drawn today and your tests are completely normal, you will receive your results only by: MyChart Message (if you have MyChart) OR A paper copy in the mail If you have any lab test that is abnormal or we need to change your treatment, we will call you to review the results.  Testing/Procedures: None  Follow-Up: At [facility], you and your health needs are our priority.  As part of our continuing mission to provide you with exceptional heart care, our providers are all part of one team.  This team includes your primary Cardiologist (physician) and Advanced Practice Providers or APPs (Physician Assistants and Nurse Practitioners) who all work together to provide you with the care you need, when you need it.  Your next appointment:   3 month(s)  Provider:   Stanly DELENA Leavens, MD    We recommend signing up for the patient portal called MyChart.  Sign up information is provided on this After Visit Summary.  MyChart is used to connect with patients for Virtual Visits (Telemedicine).  Patients are able to view lab/test results, encounter notes, upcoming appointments, etc.  Non-urgent messages can be sent to your provider as well.   To learn more about what you can do with MyChart, go to [email or web address].   Other Instructions Use OTC diclofenac topical gel as needed

## 2024-04-11 LAB — COMPREHENSIVE METABOLIC PANEL WITH GFR
ALT: 8 IU/L (ref 0–32)
AST: 11 IU/L (ref 0–40)
Albumin: 4 g/dL (ref 3.8–4.8)
Alkaline Phosphatase: 117 IU/L (ref 49–135)
BUN/Creatinine Ratio: 27 (ref 12–28)
BUN: 36 mg/dL — ABNORMAL HIGH (ref 8–27)
Bilirubin Total: 0.3 mg/dL (ref 0.0–1.2)
CO2: 28 mmol/L (ref 20–29)
Calcium: 10 mg/dL (ref 8.7–10.3)
Chloride: 90 mmol/L — ABNORMAL LOW (ref 96–106)
Creatinine, Ser: 1.35 mg/dL — ABNORMAL HIGH (ref 0.57–1.00)
Globulin, Total: 3.1 g/dL (ref 1.5–4.5)
Glucose: 131 mg/dL — ABNORMAL HIGH (ref 70–99)
Potassium: 4.2 mmol/L (ref 3.5–5.2)
Sodium: 136 mmol/L (ref 134–144)
Total Protein: 7.1 g/dL (ref 6.0–8.5)
eGFR: 40 mL/min/1.73 — ABNORMAL LOW (ref >59)

## 2024-04-14 ENCOUNTER — Other Ambulatory Visit: Payer: Self-pay | Admitting: Internal Medicine

## 2024-04-15 ENCOUNTER — Ambulatory Visit: Payer: Self-pay | Admitting: Emergency Medicine

## 2024-04-15 DIAGNOSIS — I5032 Chronic diastolic (congestive) heart failure: Secondary | ICD-10-CM

## 2024-04-16 ENCOUNTER — Other Ambulatory Visit (HOSPITAL_COMMUNITY): Payer: Self-pay

## 2024-04-16 MED ORDER — DEXCOM G7 SENSOR MISC
11 refills | Status: AC
  Filled 2024-04-16: qty 3, 30d supply, fill #0
  Filled 2024-05-12 (×2): qty 3, 30d supply, fill #1
  Filled 2024-06-11: qty 3, 30d supply, fill #2
  Filled 2024-07-07: qty 3, 30d supply, fill #3
  Filled 2024-08-04: qty 3, 30d supply, fill #4

## 2024-04-16 NOTE — Telephone Encounter (Signed)
 LVM asking pt to call our office to discuss lab results. 1st attempt

## 2024-04-17 ENCOUNTER — Other Ambulatory Visit (HOSPITAL_COMMUNITY): Payer: Self-pay

## 2024-04-22 DIAGNOSIS — M47816 Spondylosis without myelopathy or radiculopathy, lumbar region: Secondary | ICD-10-CM | POA: Diagnosis not present

## 2024-05-06 ENCOUNTER — Telehealth: Payer: Self-pay | Admitting: Internal Medicine

## 2024-05-06 ENCOUNTER — Encounter: Payer: Self-pay | Admitting: Internal Medicine

## 2024-05-06 ENCOUNTER — Ambulatory Visit (INDEPENDENT_AMBULATORY_CARE_PROVIDER_SITE_OTHER): Admitting: Internal Medicine

## 2024-05-06 VITALS — BP 116/60 | HR 60 | Temp 97.8°F | Ht 66.0 in | Wt 199.8 lb

## 2024-05-06 DIAGNOSIS — Z79899 Other long term (current) drug therapy: Secondary | ICD-10-CM

## 2024-05-06 DIAGNOSIS — E611 Iron deficiency: Secondary | ICD-10-CM | POA: Diagnosis not present

## 2024-05-06 DIAGNOSIS — R251 Tremor, unspecified: Secondary | ICD-10-CM

## 2024-05-06 DIAGNOSIS — N1832 Chronic kidney disease, stage 3b: Secondary | ICD-10-CM

## 2024-05-06 DIAGNOSIS — Z950 Presence of cardiac pacemaker: Secondary | ICD-10-CM

## 2024-05-06 DIAGNOSIS — Z7985 Long-term (current) use of injectable non-insulin antidiabetic drugs: Secondary | ICD-10-CM

## 2024-05-06 DIAGNOSIS — R7989 Other specified abnormal findings of blood chemistry: Secondary | ICD-10-CM | POA: Diagnosis not present

## 2024-05-06 DIAGNOSIS — I5032 Chronic diastolic (congestive) heart failure: Secondary | ICD-10-CM

## 2024-05-06 DIAGNOSIS — E1165 Type 2 diabetes mellitus with hyperglycemia: Secondary | ICD-10-CM

## 2024-05-06 DIAGNOSIS — Z95811 Presence of heart assist device: Secondary | ICD-10-CM

## 2024-05-06 LAB — MICROALBUMIN / CREATININE URINE RATIO
Creatinine,U: 21.1 mg/dL
Microalb Creat Ratio: UNDETERMINED mg/g (ref 0.0–30.0)
Microalb, Ur: <0.7 mg/dL

## 2024-05-06 LAB — CBC WITH DIFFERENTIAL/PLATELET
Basophils Absolute: 0.1 K/uL (ref 0.0–0.1)
Basophils Relative: 1.1 % (ref 0.0–3.0)
Eosinophils Absolute: 0.5 K/uL (ref 0.0–0.7)
Eosinophils Relative: 4.5 % (ref 0.0–5.0)
HCT: 41.5 % (ref 36.0–46.0)
Hemoglobin: 13.5 g/dL (ref 12.0–15.0)
Lymphocytes Relative: 19.4 % (ref 12.0–46.0)
Lymphs Abs: 2 K/uL (ref 0.7–4.0)
MCHC: 32.5 g/dL (ref 30.0–36.0)
MCV: 88.9 fl (ref 78.0–100.0)
Monocytes Absolute: 0.9 K/uL (ref 0.1–1.0)
Monocytes Relative: 8.7 % (ref 3.0–12.0)
Neutro Abs: 6.9 K/uL (ref 1.4–7.7)
Neutrophils Relative %: 66.3 % (ref 43.0–77.0)
Platelets: 354 K/uL (ref 150.0–400.0)
RBC: 4.67 Mil/uL (ref 3.87–5.11)
RDW: 16.3 % — ABNORMAL HIGH (ref 11.5–15.5)
WBC: 10.4 K/uL (ref 4.0–10.5)

## 2024-05-06 NOTE — Patient Instructions (Addendum)
 Check  b12 in fu and Thyroid  .  Etc today   Consider seeing neurology to evaluated  . If progressive .   As planned .   May be  may be  familial .  Planning   to have [facility]  pharmacy contact you about .   Plan fu in 3-4 months depending

## 2024-05-06 NOTE — Telephone Encounter (Signed)
 Copied from CRM #[id number]. Topic: Clinical - Medical Advice >> May 06, 2024  3:52 PM Nessti S wrote: Reason for CRM: pt had eleanor called because insulin  degludec (TRESIBA  FLEXTOUCH) 100 UNIT/ML FlexTouch Pen will discontinued 07/02/2024 and on 07/03/2024 pt will have to choose between alternatives LANTUS  or TOUJEO . Call back number [phone]

## 2024-05-06 NOTE — Progress Notes (Addendum)
 Chief Complaint  Patient presents with   Medical Management of Chronic Issues    HPI: [name] 78 y.o. come in for Chronic disease management   Last visit with me was 8 24    for dm  But [facility] and fu was noted to have low normal b12 and iron levels  and told to fu .  Dex com notes goes low  when begins. New  sensor not sure was accurate  but sugars in range  Pulmonary and  cardiac  managed byHF team  and replaced  pacer ( had pacer failure)  Jardian metformin   cymbalta   dexcom prilosec  valtrex   meds  Tremor   hands suppressible with family hx of same  not dropping thinks or fall . Husband said to get checked . Spouse thinks she  could have memory problem but she is not sure  hearing vision ok .  She doesn't get lost can drive  do calculations.  Recent left  jaw to  mouth  pain  will be seeing dentist .   ROS: See pertinent positives and negatives per HPI. No new numbness  fall     Past Medical History:  Diagnosis Date   Anticoagulant long-term use    pradaxa    Anxiety    Arthritis    fingers, lower back (04/23/2017    CAD (coronary artery disease) 8010,7996   March 1984; post PTCA with bare-metal stenting to mid RCA in December 2004   CHF (congestive heart failure) (HCC)    Chronic atrial fibrillation (HCC) 06/2007   Tachybradycardia pacemaker   Chronic kidney disease    10% function - ?R, other kidney is compensating     CVA (cerebral vascular accident) (HCC) 7994,7991   denies residual on 04/23/2017   Depression    Diplopia 06/19/2008   Qualifier: Diagnosis of  By: Charlett MD, Apolinar POUR    Dysrhythmia    ATRIAL FIBRILATION   Edema of lower extremity    Hyperlipidemia    Hypertension    1981   Inferior myocardial infarction (HCC)    acute inferior wall mi/other medical hx   Myocardial infarction (HCC)    1984, [id number]   Obesity    OSA on CPAP    last test- 2010   Pacemaker    Pneumonia 2014   tx. ----  [facility]    Pulmonary hypertension (HCC)    moderate pulmonary hypertension by 10/2016 echo and 10/2013 cardiac cath   Shortness of breath    Skin cancer    cut off right Frede; burned off LLE (04/23/2017)   Sleep apnea 1981   Spondylolisthesis    TIA (transient ischemic attack) 2008   Unspecified hemorrhoids without mention of complication 07/15/2003   Colonoscopy--Dr. Avram     Family History  Problem Relation Age of Onset   Suicidality Father        suicide death pt was 3 yrs, 1950   Arrhythmia Mother    Hypertension Mother    Diabetes Mother    Dementia Mother    Heart attack Brother    Heart disease Paternal Aunt    Prostate cancer Maternal Grandfather    Diabetes Paternal Grandfather        fathers side of the family   Colon cancer Maternal Aunt     Social History   Socioeconomic History   Marital status: Married    Spouse name: Breniya Goertzen   Number of children: 0  Years of education: HS   Highest education level: High school graduate  Occupational History   Occupation: retired    Comment: previously worked [employer]  Tobacco Use   Smoking status: Former    Current packs/day: 0.00    Average packs/day: 1 pack/day for 5.0 years (5.0 ttl pk-yrs)    Types: Cigarettes    Start date: 05/11/1978    Quit date: 07/03/1982    Years since quitting: 41.8   Smokeless tobacco: Never   Tobacco comments:    Former smoker 02/26/24  Vaping Use   Vaping status: Never Used  Substance and Sexual Activity   Alcohol  use: Not Currently   Drug use: No   Sexual activity: Not Currently  Other Topics Concern   Not on file  Social History Narrative   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   Married    Originally from [place]    Household of two, high school education   Former smoker Therapist, Nutritional dogs 7    Retired from [employer]   [id number]   [employer] 2    Social Drivers of [employer]: Low Risk  (01/21/2024)   Overall  Financial Resource Strain (CARDIA)    Difficulty of Paying Living Expenses: Not hard at all  Food Insecurity: No Food Insecurity (02/01/2024)   Hunger Vital Sign    Worried About Running Out of Food in the Last Year: Never true    Ran Out of Food in the Last Year: Never true  Transportation Needs: No Transportation Needs (02/01/2024)   PRAPARE - Administrator, Civil Service (Medical): No    Lack of Transportation (Non-Medical): No  Physical Activity: Insufficiently Active (01/21/2024)   Exercise Vital Sign    Days of Exercise per Week: 3 days    Minutes of Exercise per Session: 20 min  Stress: No Stress Concern Present (01/21/2024)   [employer] of Occupational Health - Occupational Stress Questionnaire    Feeling of Stress: Not at all  Social Connections: Unknown (01/30/2024)   Social Connection and Isolation Panel    Frequency of Communication with Friends and Family: More than three times a week    Frequency of Social Gatherings with Friends and Family: More than three times a week    Attends Religious Services: Not on Marketing Executive or Organizations: Not on file    Attends Banker Meetings: Not on file    Marital Status: Not on file    Outpatient Medications Prior to Visit  Medication Sig Dispense Refill   apixaban  (ELIQUIS ) 5 MG TABS tablet Take 1 tablet (5 mg total) by mouth 2 (two) times daily. 180 tablet 1   atorvastatin  (LIPITOR) 40 MG tablet Take 1 tablet (40 mg total) by mouth daily. 90 tablet 3   Continuous Glucose Receiver (DEXCOM G7 RECEIVER) DEVI Use as directed while on insulin  1 each 3   Continuous Glucose Sensor (DEXCOM G7 SENSOR) MISC Apply 1 sensor to skin as directed to monitor blood sugars. Replace with new sensor every 10 days. 3 each 11   diltiazem  (CARDIZEM  LA) 360 MG 24 hr tablet Take 1 tablet (360 mg total) by mouth daily. 90 tablet 3   dofetilide  (TIKOSYN ) 250 MCG capsule Take 1 capsule (250 mcg total) by mouth 2  (two) times daily with breakfast and at bedtime. 180 capsule 3   DULoxetine  (CYMBALTA ) 60 MG capsule Take 1  capsule (60 mg total) by mouth daily. 90 capsule 3   empagliflozin  (JARDIANCE ) 25 MG TABS tablet Take 1 tablet (25 mg total) by mouth daily before breakfast. 90 tablet 0   ezetimibe  (ZETIA ) 10 MG tablet Take 1 tablet (10 mg total) by mouth daily. 90 tablet 3   insulin  degludec (TRESIBA  FLEXTOUCH) 100 UNIT/ML FlexTouch Pen Inject 28 Units into the skin daily as directed 9 mL 3   loratadine  (CLARITIN ) 10 MG tablet TAKE 1 TABLET BY MOUTH EVERY DAY 90 tablet 0   metFORMIN  (GLUCOPHAGE ) 1000 MG tablet Take 1 tablet (1,000 mg total) by mouth 2 (two) times daily. 180 tablet 3   metolazone  (ZAROXOLYN ) 2.5 MG tablet Take 1 tablet (2.5 mg total) by mouth as directed. Weekly as needed for weight gain of 3 lb in 24 hours, 5 lb in a week or leg swelling 25 tablet 2   metoprolol  tartrate (LOPRESSOR ) 50 MG tablet Take 1 tablet (50 mg total) by mouth every morning AND 2 tablets (100 mg total) every evening. 270 tablet 3   nitroGLYCERIN  (NITROSTAT ) 0.4 MG SL tablet Place 1 tablet (0.4 mg total) under the tongue every 5 (five) minutes x 3 doses as needed for chest pain. 25 tablet 3   nystatin -triamcinolone  (MYCOLOG II) cream Apply topically 2 (two) times daily as directed. 30 g 1   omeprazole  (PRILOSEC) 20 MG capsule Take 1 capsule (20 mg total) by mouth 2 (two) times daily before breakfast and at bedtime. 60 capsule 3   ondansetron  (ZOFRAN -ODT) 4 MG disintegrating tablet Take 1 tablet (4 mg total) by mouth every 8 (eight) hours as needed for nausea or vomiting. 20 tablet 1   OXYGEN  Inhale 4 L/min into the lungs continuous.     Polyethyl Glycol-Propyl Glycol (SYSTANE) 0.4-0.3 % SOLN Place 1 drop into both eyes daily as needed (dry eyes).     spironolactone  (ALDACTONE ) 25 MG tablet Take 0.5 tablets (12.5 mg total) by mouth daily. 45 tablet 2   tirzepatide  (MOUNJARO ) 15 MG/0.5ML Pen Inject 15 mg into the skin once  a week. 2 mL 11   torsemide  (DEMADEX ) 20 MG tablet Take 3 tablets (60 mg total) by mouth daily before breakfast 270 tablet 3   UNABLE TO FIND Inhale 1 Device into the lungs at bedtime. CPAP     valACYclovir  (VALTREX ) 1000 MG tablet TAKE 2 TABLETS BY MOUTH EVERY 12 HOURS AS NEEDED 34 tablet 2   No facility-administered medications prior to visit.     EXAM:  BP 116/60 (BP Location: Right Arm, Patient Position: Sitting, Cuff Size: Large)   Pulse 60   Temp 97.8 F (36.6 C) (Oral)   Ht 5' 6 (1.676 m)   Wt 199 lb 12.8 oz (90.6 kg)   SpO2 96%   BMI 32.25 kg/m   Body mass index is 32.25 kg/m.  GENERAL: vitals reviewed and listed above, alert, oriented, appears well hydrated and in no acute distress on O2  HEENT: atraumatic, conjunctiva  clear, no obvious abnormalities on inspection of external nose and ears OP : no lesion edema or exudate  no ob lesion  NECK: no obvious masses on inspection palpation  LUNGS: clear to auscultation bilaterally, no wheezes, rales or rhonchi, g CV: HRRR, no clubbing cyanosis min to now   peripheral edema nl cap refill  MS: moves all extremities without noticeable focal  abnormality Neuro  intermittent  tremor suppressible  ? Min cogwheeling ?  PSYCH: pleasant and cooperative, no obvious depression or anxiety cogntiion  grossly normal   Lab Results  Component Value Date   WBC 10.4 05/06/2024   HGB 13.5 05/06/2024   HCT 41.5 05/06/2024   PLT 354.0 05/06/2024   GLUCOSE 131 (H) 04/10/2024   CHOL 111 08/03/2023   TRIG 190 (H) 08/03/2023   HDL 28 (L) 08/03/2023   LDLDIRECT 76.0 08/23/2021   LDLCALC 45 08/03/2023   ALT 8 04/10/2024   AST 11 04/10/2024   NA 136 04/10/2024   K 4.2 04/10/2024   CL 90 (L) 04/10/2024   CREATININE 1.35 (H) 04/10/2024   BUN 36 (H) 04/10/2024   CO2 28 04/10/2024   TSH 4.21 05/06/2024   INR 1.3 (H) 02/17/2020   HGBA1C 6.0 (A) 05/07/2024   MICROALBUR <0.7 05/06/2024   BP Readings from Last 3 Encounters:  05/06/24 116/60   04/10/24 100/67  04/03/24 106/60   Lab Results  Component Value Date   VITAMINB12 284 05/06/2024    ASSESSMENT AND PLAN:  Discussed the following assessment and plan:  Type 2 diabetes mellitus with hyperglycemia, without long-term current use of insulin  (HCC) - Plan: POC HgB A1c, T4, free, TSH, Vitamin B12, IBC + Ferritin, Microalbumin / creatinine urine ratio  Tremor - Plan: T4, free, TSH, Vitamin B12, IBC + Ferritin  Low vitamin B12 level - Plan: T4, free, TSH, Vitamin B12, IBC + Ferritin, Iron, TIBC and Ferritin Panel, CBC with Differential/Platelets  Iron deficiency - Plan: T4, free, TSH, Vitamin B12, IBC + Ferritin  Chronic diastolic heart failure (HCC)  Medication management  Presence of heart assist device (HCC)  Chronic kidney disease, stage 3b (HCC)  PACEMAKER, PERMANENT Criteria asks for  urine microalbumin  A number or issues  Update thyroid  b12 and   iron  Tremor    runs in family  .    Uncertain if cognitive an issue.  Uncertain  issues with BGF monitoring  Consider  neuro consult  disc   -Patient advised to return or notify health care team  if  new concerns arise.  Patient Instructions  Check  b12 in fu and Thyroid  .  Etc today   Consider seeing neurology to evaluated  . If progressive .   As planned .   May be  may be  familial .  Planning   to have [facility]  pharmacy contact you about .   Plan fu in 3-4 months depending   Brysen Shankman K. Keleigh Kazee M.D.  [name] is clinically significant  please see lab    with cr 1.35 in the note

## 2024-05-07 LAB — T4, FREE: Free T4: 0.88 ng/dL (ref 0.60–1.60)

## 2024-05-07 LAB — IBC + FERRITIN
Ferritin: 17.7 ng/mL (ref 10.0–291.0)
Iron: 42 ug/dL (ref 42–145)
Saturation Ratios: 9.2 % — ABNORMAL LOW (ref 20.0–50.0)
TIBC: 456.4 ug/dL — ABNORMAL HIGH (ref 250.0–450.0)
Transferrin: 326 mg/dL (ref 212.0–360.0)

## 2024-05-07 LAB — VITAMIN B12: Vitamin B-12: 284 pg/mL (ref 211–911)

## 2024-05-07 LAB — IRON,TIBC AND FERRITIN PANEL
%SAT: 10 % — ABNORMAL LOW (ref 16–45)
Ferritin: 18 ng/mL (ref 16–288)
Iron: 42 ug/dL — ABNORMAL LOW (ref 45–160)
TIBC: 434 ug/dL (ref 250–450)

## 2024-05-07 LAB — POCT GLYCOSYLATED HEMOGLOBIN (HGB A1C): Hemoglobin A1C: 6 % — AB (ref 4.0–5.6)

## 2024-05-07 LAB — TSH: TSH: 4.21 u[IU]/mL (ref 0.35–5.50)

## 2024-05-08 NOTE — Telephone Encounter (Signed)
 I agree that toujeo  would be best alternative to the tresiba . I can make sure to discuss this with patient when we have our scheduled office visit

## 2024-05-08 NOTE — Telephone Encounter (Signed)
 I suppose  try TOUjeo   but  also asking Jon whom I referred to get her input

## 2024-05-11 NOTE — Telephone Encounter (Signed)
 Hi angela  please help pick an insulin  you think  might be covered ?

## 2024-05-12 ENCOUNTER — Other Ambulatory Visit: Payer: Self-pay

## 2024-05-12 ENCOUNTER — Other Ambulatory Visit (HOSPITAL_COMMUNITY): Payer: Self-pay

## 2024-05-12 MED ORDER — COMIRNATY 30 MCG/0.3ML IM SUSY
0.3000 mL | PREFILLED_SYRINGE | Freq: Once | INTRAMUSCULAR | 0 refills | Status: AC
  Filled 2024-05-12: qty 0.3, 1d supply, fill #0

## 2024-05-13 ENCOUNTER — Encounter: Payer: Self-pay | Admitting: Cardiology

## 2024-05-13 ENCOUNTER — Other Ambulatory Visit: Payer: Self-pay

## 2024-05-13 ENCOUNTER — Ambulatory Visit: Attending: Cardiology | Admitting: Cardiology

## 2024-05-13 ENCOUNTER — Ambulatory Visit: Payer: Self-pay | Admitting: Cardiology

## 2024-05-13 VITALS — BP 124/58 | HR 73 | Ht 66.0 in | Wt 200.0 lb

## 2024-05-13 DIAGNOSIS — Z5181 Encounter for therapeutic drug level monitoring: Secondary | ICD-10-CM

## 2024-05-13 DIAGNOSIS — I495 Sick sinus syndrome: Secondary | ICD-10-CM | POA: Diagnosis not present

## 2024-05-13 DIAGNOSIS — I4819 Other persistent atrial fibrillation: Secondary | ICD-10-CM | POA: Diagnosis not present

## 2024-05-13 DIAGNOSIS — I484 Atypical atrial flutter: Secondary | ICD-10-CM

## 2024-05-13 DIAGNOSIS — Z79899 Other long term (current) drug therapy: Secondary | ICD-10-CM

## 2024-05-13 DIAGNOSIS — R001 Bradycardia, unspecified: Secondary | ICD-10-CM

## 2024-05-13 DIAGNOSIS — D6869 Other thrombophilia: Secondary | ICD-10-CM

## 2024-05-13 LAB — CUP PACEART INCLINIC DEVICE CHECK
Battery Remaining Longevity: 135 mo
Battery Voltage: 3.04 V
Brady Statistic RA Percent Paced: 22 %
Brady Statistic RV Percent Paced: 13 %
Date Time Interrogation Session: 20251111164021
Implantable Lead Connection Status: 753985
Implantable Lead Connection Status: 753985
Implantable Lead Implant Date: 20081211
Implantable Lead Implant Date: 20081211
Implantable Lead Location: 753859
Implantable Lead Location: 753860
Implantable Pulse Generator Implant Date: 20250730
Lead Channel Impedance Value: 412.5 Ohm
Lead Channel Impedance Value: 425 Ohm
Lead Channel Pacing Threshold Amplitude: 1 V
Lead Channel Pacing Threshold Amplitude: 1.5 V
Lead Channel Pacing Threshold Amplitude: 1.5 V
Lead Channel Pacing Threshold Pulse Width: 0.5 ms
Lead Channel Pacing Threshold Pulse Width: 0.7 ms
Lead Channel Pacing Threshold Pulse Width: 0.7 ms
Lead Channel Sensing Intrinsic Amplitude: 12 mV
Lead Channel Sensing Intrinsic Amplitude: 2.4 mV
Lead Channel Setting Pacing Amplitude: 2 V
Lead Channel Setting Pacing Amplitude: 2.5 V
Lead Channel Setting Pacing Pulse Width: 0.7 ms
Lead Channel Setting Sensing Sensitivity: 2 mV
Pulse Gen Model: 2272
Pulse Gen Serial Number: 8294133

## 2024-05-13 NOTE — Progress Notes (Signed)
  Electrophysiology Office Note:   Date:  05/13/2024  ID:  AELA BOHAN, DOB [birth year], MRN [id number]  Primary Cardiologist: Stanly DELENA Leavens, MD Primary Heart Failure: None Electrophysiologist: Rose Hippler Gladis Norton, MD      History of Present Illness:   [name] is a 78 y.o. female with h/o tachybradycardia syndrome, atrial fibrillation/flutter, hypertension, coronary artery disease seen today for routine electrophysiology followup.   Since last being seen in our clinic the patient reports doing well since her generator change.  She has no acute complaints.  She continues to do her daily activities without restriction.  She is doing well on oxygen  for lung disease.  she denies chest pain, palpitations, dyspnea, PND, orthopnea, nausea, vomiting, dizziness, syncope, edema, weight gain, or early satiety.   Review of systems complete and found to be negative unless listed in HPI.      EP Information / Studies Reviewed:    EKG is ordered today. Personal review as below.  EKG Interpretation Date/Time:  Tuesday May 13 2024 14:54:11 EST Ventricular Rate:  73 PR Interval:  290 QRS Duration:  88 QT Interval:  404 QTC Calculation: 445 R Axis:   89  Text Interpretation: Sinus rhythm with 1st degree A-V block When compared with ECG of 10-Apr-2024 09:48, No significant change was found Confirmed by Talaysia Pinheiro ([id number]) on 05/13/2024 3:21:25 PM   PPM Interrogation-  reviewed in detail today,  See PACEART report.  Device History: Abbott Dual Chamber PPM implanted for Tachy-Brady syndrome  Risk Assessment/Calculations:    CHA2DS2-VASc Score = 7   This indicates a 11.2% annual risk of stroke. The patient's score is based upon: CHF History: 1 HTN History: 1 Diabetes History: 1 Stroke History: 0 Vascular Disease History: 1 Age Score: 2 Gender Score: 1             Physical Exam:   VS:  BP (!) 124/58 (BP Location: Right Arm, Patient Position: Sitting, Cuff Size:  Large)   Pulse 73   Ht 5' 6 (1.676 m)   Wt 200 lb (90.7 kg)   SpO2 94%   BMI 32.28 kg/m    Wt Readings from Last 3 Encounters:  05/13/24 200 lb (90.7 kg)  05/06/24 199 lb 12.8 oz (90.6 kg)  04/10/24 197 lb (89.4 kg)     GEN: Well nourished, well developed in no acute distress NECK: No JVD; No carotid bruits CARDIAC: Regular rate and rhythm, no murmurs, rubs, gallops RESPIRATORY:  Clear to auscultation without rales, wheezing or rhonchi  ABDOMEN: Soft, non-tender, non-distended EXTREMITIES:  No edema; No deformity   ASSESSMENT AND PLAN:    Tachy-Brady syndrome s/p Abbott PPM  Normal PPM function See Pace Art report No changes today  2.  Paroxysmal atrial fibrillation/flutter: Post ablation x 3, recently in 2022.  On dofetilide .  Remains in sinus rhythm without recurrence.  3.  Secondary hypercoagulable state: On Eliquis   4.  High risk medication monitoring: On dofetilide .  QT remains acceptable.  Dennys Guin check a BMP and magnesium  today.  5.  Coronary artery disease: No ischemic symptoms  Disposition:   Follow up with [facility] in 6 months  Signed, Zell Hylton Gladis Norton, MD

## 2024-05-13 NOTE — Patient Instructions (Signed)
 Medication Instructions:  Your physician recommends that you continue on your current medications as directed. Please refer to the Current Medication list given to you today.  *If you need a refill on your cardiac medications before your next appointment, please call your pharmacy*  Lab Work: BMET and Mg If you have labs (blood work) drawn today and your tests are completely normal, you will receive your results only by: MyChart Message (if you have MyChart) OR A paper copy in the mail If you have any lab test that is abnormal or we need to change your treatment, we will call you to review the results.  Testing/Procedures: Your physician recommends that you continue on your current medications as directed. Please refer to the Current Medication list given to you today.   Follow-Up: At [facility], you and your health needs are our priority.  As part of our continuing mission to provide you with exceptional heart care, our providers are all part of one team.  This team includes your primary Cardiologist (physician) and Advanced Practice Providers or APPs (Physician Assistants and Nurse Practitioners) who all work together to provide you with the care you need, when you need it.  Your next appointment:   6 months with [facility]  2 years with EP APP for pacemaker follow up.  We recommend signing up for the patient portal called MyChart.  Sign up information is provided on this After Visit Summary.  MyChart is used to connect with patients for Virtual Visits (Telemedicine).  Patients are able to view lab/test results, encounter notes, upcoming appointments, etc.  Non-urgent messages can be sent to your provider as well.   To learn more about what you can do with MyChart, go to [email or web address].

## 2024-05-13 NOTE — Telephone Encounter (Signed)
 Hey,  Toujeo  Solostar insulin  should be no charge for the patient with her insurance!

## 2024-05-14 LAB — BASIC METABOLIC PANEL WITH GFR
BUN/Creatinine Ratio: 15 (ref 12–28)
BUN: 17 mg/dL (ref 8–27)
CO2: 24 mmol/L (ref 20–29)
Calcium: 10 mg/dL (ref 8.7–10.3)
Chloride: 95 mmol/L — ABNORMAL LOW (ref 96–106)
Creatinine, Ser: 1.1 mg/dL — ABNORMAL HIGH (ref 0.57–1.00)
Glucose: 99 mg/dL (ref 70–99)
Potassium: 4.5 mmol/L (ref 3.5–5.2)
Sodium: 138 mmol/L (ref 134–144)
eGFR: 51 mL/min/1.73 — ABNORMAL LOW (ref >59)

## 2024-05-14 LAB — MAGNESIUM: Magnesium: 2.5 mg/dL — ABNORMAL HIGH (ref 1.6–2.3)

## 2024-05-15 ENCOUNTER — Ambulatory Visit (INDEPENDENT_AMBULATORY_CARE_PROVIDER_SITE_OTHER)

## 2024-05-15 DIAGNOSIS — I495 Sick sinus syndrome: Secondary | ICD-10-CM | POA: Diagnosis not present

## 2024-05-15 LAB — CUP PACEART REMOTE DEVICE CHECK
Battery Remaining Longevity: 123 mo
Battery Remaining Percentage: 95.5 %
Battery Voltage: 3.04 V
Brady Statistic AP VP Percent: 1 %
Brady Statistic AP VS Percent: 6 %
Brady Statistic AS VP Percent: 1 %
Brady Statistic AS VS Percent: 94 %
Brady Statistic RA Percent Paced: 6 %
Brady Statistic RV Percent Paced: 2.3 %
Date Time Interrogation Session: 20251113025819
Implantable Lead Connection Status: 753985
Implantable Lead Connection Status: 753985
Implantable Lead Implant Date: 20081211
Implantable Lead Implant Date: 20081211
Implantable Lead Location: 753859
Implantable Lead Location: 753860
Implantable Pulse Generator Implant Date: 20250730
Lead Channel Impedance Value: 380 Ohm
Lead Channel Impedance Value: 410 Ohm
Lead Channel Pacing Threshold Amplitude: 1.25 V
Lead Channel Pacing Threshold Amplitude: 1.5 V
Lead Channel Pacing Threshold Pulse Width: 0.5 ms
Lead Channel Pacing Threshold Pulse Width: 0.7 ms
Lead Channel Sensing Intrinsic Amplitude: 1.2 mV
Lead Channel Sensing Intrinsic Amplitude: 11.5 mV
Lead Channel Setting Pacing Amplitude: 2.25 V
Lead Channel Setting Pacing Amplitude: 2.5 V
Lead Channel Setting Pacing Pulse Width: 0.7 ms
Lead Channel Setting Sensing Sensitivity: 2 mV
Pulse Gen Model: 2272
Pulse Gen Serial Number: 8294133

## 2024-05-19 ENCOUNTER — Other Ambulatory Visit: Payer: Self-pay | Admitting: Internal Medicine

## 2024-05-20 ENCOUNTER — Other Ambulatory Visit: Payer: Self-pay

## 2024-05-20 ENCOUNTER — Other Ambulatory Visit (HOSPITAL_COMMUNITY): Payer: Self-pay

## 2024-05-20 ENCOUNTER — Other Ambulatory Visit: Payer: Self-pay | Admitting: Internal Medicine

## 2024-05-20 ENCOUNTER — Ambulatory Visit: Payer: Self-pay | Admitting: Cardiology

## 2024-05-20 DIAGNOSIS — I2510 Atherosclerotic heart disease of native coronary artery without angina pectoris: Secondary | ICD-10-CM

## 2024-05-20 DIAGNOSIS — E785 Hyperlipidemia, unspecified: Secondary | ICD-10-CM

## 2024-05-20 DIAGNOSIS — Z79899 Other long term (current) drug therapy: Secondary | ICD-10-CM

## 2024-05-20 MED ORDER — SPIRONOLACTONE 25 MG PO TABS
12.5000 mg | ORAL_TABLET | Freq: Every day | ORAL | 3 refills | Status: AC
  Filled 2024-05-20: qty 45, 90d supply, fill #0

## 2024-05-20 MED ORDER — EZETIMIBE 10 MG PO TABS
10.0000 mg | ORAL_TABLET | Freq: Every day | ORAL | 3 refills | Status: AC
  Filled 2024-05-20: qty 90, 90d supply, fill #0

## 2024-05-20 NOTE — Progress Notes (Signed)
 Remote PPM Transmission

## 2024-05-21 ENCOUNTER — Other Ambulatory Visit: Payer: Self-pay

## 2024-05-22 ENCOUNTER — Other Ambulatory Visit (HOSPITAL_COMMUNITY): Payer: Self-pay

## 2024-05-26 ENCOUNTER — Other Ambulatory Visit: Payer: Self-pay

## 2024-06-02 ENCOUNTER — Ambulatory Visit

## 2024-06-02 ENCOUNTER — Other Ambulatory Visit: Payer: Self-pay | Admitting: Internal Medicine

## 2024-06-02 ENCOUNTER — Other Ambulatory Visit (HOSPITAL_COMMUNITY): Payer: Self-pay

## 2024-06-02 DIAGNOSIS — E1165 Type 2 diabetes mellitus with hyperglycemia: Secondary | ICD-10-CM

## 2024-06-02 DIAGNOSIS — I484 Atypical atrial flutter: Secondary | ICD-10-CM

## 2024-06-02 MED ORDER — DILTIAZEM HCL ER 360 MG PO TB24
360.0000 mg | ORAL_TABLET | Freq: Every day | ORAL | 3 refills | Status: AC
  Filled 2024-06-04: qty 90, 90d supply, fill #0

## 2024-06-02 NOTE — Progress Notes (Signed)
 06/02/2024 Name: [name] MRN: [id number] DOB: Jun 03, 1946  Chief Complaint  Patient presents with   Medication Management   Diabetes    [name] is a 78 y.o. year old female who presented for a telephone visit.   They were referred to the pharmacist by their PCP for assistance in managing diabetes.    Subjective:  Care Team: Primary Care Provider: [name], Wanda K, MD   Medication Access/Adherence  Current Pharmacy:  DARRYLE LAW - [facility] [address] [place] [address] Phone: [phone] Fax: [phone]   Patient reports affordability concerns with their medications: No  Patient reports access/transportation concerns to their pharmacy: No  Patient reports adherence concerns with their medications:  No     Diabetes:  Current medications: Mounjaro  15mg  once weekly, Jardiance  25mg  daily, Metformin  1000mg  BID, Tresiba  28 units daily Medications tried in the past: None  Current glucose readings:   Observed patterns:  Patient denies hypoglycemic s/sx including dizziness, shakiness, sweating. Patient denies hyperglycemic symptoms including polyuria, polydipsia, polyphagia, nocturia, neuropathy, blurred vision.  Patient is questioning the validity of the low BG alarms due to no symptoms of low BG when the alarm goes off.   Macrovascular and Microvascular Risk Reduction:  Statin? yes (Atorvastatin  40mg ); ACEi/ARB? no; therapy not indicated  Last urinary albumin /creatinine ratio:  Lab Results  Component Value Date   MICRALBCREAT Unable to calculate 05/06/2024   Last eye exam:  Lab Results  Component Value Date   HMDIABEYEEXA No Retinopathy 03/27/2023   Last foot exam: No foot exam found Tobacco Use:  Tobacco Use: Medium Risk (05/13/2024)   Patient History    Smoking Tobacco Use: Former    Smokeless Tobacco Use: Never    Passive Exposure: Not on file     Objective:  Lab Results  Component Value Date   HGBA1C 6.0  (A) 05/07/2024    Lab Results  Component Value Date   CREATININE 1.10 (H) 05/13/2024   BUN 17 05/13/2024   NA 138 05/13/2024   K 4.5 05/13/2024   CL 95 (L) 05/13/2024   CO2 24 05/13/2024    Lab Results  Component Value Date   CHOL 111 08/03/2023   HDL 28 (L) 08/03/2023   LDLCALC 45 08/03/2023   LDLDIRECT 76.0 08/23/2021   TRIG 190 (H) 08/03/2023   CHOLHDL 4.0 08/03/2023    Medications Reviewed Today     Reviewed by Lionell Jon DEL, RPH (Pharmacist) on 06/02/24 at 1112  Med List Status: <None>   Medication Order Taking? Sig Documenting Provider Last Dose Status Informant  apixaban  (ELIQUIS ) 5 MG TABS tablet [id number] Yes Take 1 tablet (5 mg total) by mouth 2 (two) times daily. Santo Stanly LABOR, MD  Active   atorvastatin  (LIPITOR) 40 MG tablet [id number] Yes Take 1 tablet (40 mg total) by mouth daily. Santo Stanly LABOR, MD  Active Self, Pharmacy Records  Continuous Glucose Receiver WILMOT G7 [place]) [place] [id number]  Use as directed while on insulin  [name], Wanda K, MD  Active Self, Pharmacy Records  Continuous Glucose Sensor (DEXCOM G7 SENSOR) [place] [id number] Yes Apply 1 sensor to skin as directed to monitor blood sugars. Replace with new sensor every 10 days. [name], Apolinar POUR, MD  Active   diltiazem  (CARDIZEM  LA) 360 MG 24 hr tablet [id number] Yes Take 1 tablet (360 mg total) by mouth daily. Santo Stanly LABOR, MD  Active Self, Pharmacy Records  dofetilide  (TIKOSYN ) 250 MCG capsule [id number] Yes Take 1 capsule (250 mcg  total) by mouth 2 (two) times daily with breakfast and at bedtime. Nahser, Aleene PARAS, MD  Active Self, Pharmacy Records  DULoxetine  (CYMBALTA ) 60 MG capsule [id number] Yes Take 1 capsule (60 mg total) by mouth daily. [name], Apolinar POUR, MD  Active   empagliflozin  (JARDIANCE ) 25 MG TABS tablet [id number] Yes Take 1 tablet (25 mg total) by mouth daily before breakfast. [name], Wanda K, MD  Active   ezetimibe  (ZETIA ) 10 MG tablet [id number] Yes Take 1 tablet  (10 mg total) by mouth daily. Santo Stanly LABOR, MD  Active   insulin  degludec (TRESIBA  FLEXTOUCH) 100 UNIT/ML FlexTouch Pen [id number] Yes Inject 28 Units into the skin daily as directed [name], Wanda K, MD  Active   loratadine  (CLARITIN ) 10 MG tablet [id number] Yes TAKE 1 TABLET BY MOUTH EVERY DAY [name], Wanda K, MD  Active Self, Pharmacy Records  metFORMIN  (GLUCOPHAGE ) 1000 MG tablet [id number] Yes Take 1 tablet (1,000 mg total) by mouth 2 (two) times daily. Webb, Padonda B, FNP  Active Self, Pharmacy Records  metolazone  (ZAROXOLYN ) 2.5 MG tablet [id number] Yes Take 1 tablet (2.5 mg total) by mouth as directed. Weekly as needed for weight gain of 3 lb in 24 hours, 5 lb in a week or leg swelling Rolan Ezra RAMAN, MD  Active Self, Pharmacy Records  metoprolol  tartrate (LOPRESSOR ) 50 MG tablet [id number] Yes Take 1 tablet (50 mg total) by mouth every morning AND 2 tablets (100 mg total) every evening. Nahser, Aleene PARAS, MD  Active Self, Pharmacy Records  nitroGLYCERIN  (NITROSTAT ) 0.4 MG SL tablet [id number] Yes Place 1 tablet (0.4 mg total) under the tongue every 5 (five) minutes x 3 doses as needed for chest pain. Santo Stanly LABOR, MD  Active Self, Pharmacy Records  nystatin -triamcinolone  [facility]) cream [id number] Yes Apply topically 2 (two) times daily as directed. Billy Philippe SAUNDERS, NP  Active   omeprazole  (PRILOSEC) 20 MG capsule [id number] Yes Take 1 capsule (20 mg total) by mouth 2 (two) times daily before breakfast and at bedtime. [name], Apolinar POUR, MD  Active Self, Pharmacy Records  ondansetron  (ZOFRAN -ODT) 4 MG disintegrating tablet [id number] Yes Take 1 tablet (4 mg total) by mouth every 8 (eight) hours as needed for nausea or vomiting. [name], Apolinar POUR, MD  Active Self, Pharmacy Records  OXYGEN  [id number] Yes Inhale 4 L/min into the lungs continuous. [provider]  Active Pharmacy Records, Self  Polyethyl Glycol-Propyl Glycol (SYSTANE) 0.4-0.3 % SOLN [id number] Yes Place 1 drop  into both eyes daily as needed (dry eyes). [provider]  Active Self, Pharmacy Records  spironolactone  (ALDACTONE ) 25 MG tablet [id number] Yes Take 0.5 tablets (12.5 mg total) by mouth daily. Campbell, Kenzie E, NP  Active   tirzepatide  (MOUNJARO ) 15 MG/0.5ML Pen [id number] Yes Inject 15 mg into the skin once a week. Santo Stanly LABOR, MD  Active   torsemide  (DEMADEX ) 20 MG tablet [id number] Yes Take 3 tablets (60 mg total) by mouth daily before breakfast Nahser, Aleene PARAS, MD  Active Self, Pharmacy Records  UNABLE TO FIND [id number] Yes Inhale 1 Device into the lungs at bedtime. CPAP [provider]  Active Pharmacy Records, Self  valACYclovir  (VALTREX ) 1000 MG tablet [id number] Yes TAKE 2 TABLETS BY MOUTH EVERY 12 HOURS AS NEEDED [name], Wanda K, MD  Active Self, Pharmacy Records           Med Note (COFFELL, Judee Hennick M   Wed Jan 30, 2024  9:48 AM)  Assessment/Plan:   Diabetes: - Currently controlled; goal A1c <7%. Cardiorenal risk reduction is optimized.. Blood pressure is at goal <130/80. LDL is at goal.  - Reviewed long term cardiovascular and renal outcomes of uncontrolled blood sugar., Reviewed goal A1c, goal fasting, and goal 2 hour post prandial glucose. Recommended to check glucose continously with sensor, and Reviewed signs and symptoms of hypoglycemia. -Continue current medication therapy. Consider dose reduction in insulin  at follow up appt if lows continue. -Counseled to prick finger for BG check for any low BG events between now and f/u. Keep a log of fingerprick reading vs sensor reading. -Counseled that metoprolol  can actually mask symptoms of low BG  Follow Up Plan: 2 weeks  Jon VEAR Lindau, PharmD Clinical Pharmacist [phone]

## 2024-06-04 ENCOUNTER — Other Ambulatory Visit: Payer: Self-pay

## 2024-06-04 ENCOUNTER — Other Ambulatory Visit (HOSPITAL_COMMUNITY): Payer: Self-pay

## 2024-06-06 ENCOUNTER — Other Ambulatory Visit (HOSPITAL_COMMUNITY): Payer: Self-pay

## 2024-06-06 ENCOUNTER — Other Ambulatory Visit: Payer: Self-pay

## 2024-06-06 ENCOUNTER — Telehealth: Payer: Self-pay | Admitting: Cardiology

## 2024-06-06 MED ORDER — METOPROLOL TARTRATE 50 MG PO TABS
ORAL_TABLET | ORAL | 3 refills | Status: AC
  Filled 2024-06-06: qty 270, 90d supply, fill #0

## 2024-06-06 NOTE — Telephone Encounter (Signed)
*  STAT* If patient is at the pharmacy, call can be transferred to refill team.   1. Which medications need to be refilled? (please list name of each medication and dose if known)   metoprolol  tartrate (LOPRESSOR ) 50 MG tablet     2. Would you like to learn more about the convenience, safety, & potential cost savings by using the [facility]? no   3. Are you open to using the [facility] (Type [facility].  ). No    4. Which pharmacy/location (including street and city if local pharmacy) is medication to be sent to? [place] - [facility]     5. Do they need a 30 day or 90 day supply? 90 day   Pt is out of medication

## 2024-06-06 NOTE — Telephone Encounter (Signed)
 Refill sent

## 2024-06-11 ENCOUNTER — Other Ambulatory Visit: Payer: Self-pay

## 2024-06-12 ENCOUNTER — Other Ambulatory Visit (HOSPITAL_COMMUNITY): Payer: Self-pay

## 2024-06-18 ENCOUNTER — Other Ambulatory Visit: Payer: Self-pay

## 2024-06-18 ENCOUNTER — Ambulatory Visit

## 2024-06-18 DIAGNOSIS — E1165 Type 2 diabetes mellitus with hyperglycemia: Secondary | ICD-10-CM

## 2024-06-18 NOTE — Progress Notes (Signed)
 06/18/2024 Name: [name] MRN: [id number] DOB: 1946-01-17  Chief Complaint  Patient presents with   Medication Management   Diabetes    [name] is a 79 y.o. year old female who presented for a telephone visit.   They were referred to the pharmacist by their PCP for assistance in managing diabetes.    Subjective:  Care Team: Primary Care Provider: [name], Wanda K, MD   Medication Access/Adherence  Current Pharmacy:  DARRYLE LAW - [facility] [address] [place] [address] Phone: [phone] Fax: [phone]   Patient reports affordability concerns with their medications: No  Patient reports access/transportation concerns to their pharmacy: No  Patient reports adherence concerns with their medications:  No     Diabetes:  Current medications: Mounjaro  15mg  once weekly, Jardiance  25mg  daily, Metformin  1000mg  BID, Tresiba  28 units daily Medications tried in the past: None  Current glucose readings:   Observed patterns:  Patient denies hypoglycemic s/sx including dizziness, shakiness, sweating. Patient denies hyperglycemic symptoms including polyuria, polydipsia, polyphagia, nocturia, neuropathy, blurred vision.  Report shows recurrent lows overnight. Patient got up and checked BG with finger prick every time she got a low BG alarm. Finger prick showed no low BG at that time. Did not make note of arrow trend on sensor. Interested in decreasing insulin  if possible   Macrovascular and Microvascular Risk Reduction:  Statin? yes (Atorvastatin  40mg ); ACEi/ARB? no; therapy not indicated  Last urinary albumin /creatinine ratio:  Lab Results  Component Value Date   MICRALBCREAT Unable to calculate 05/06/2024   Last eye exam:  Lab Results  Component Value Date   HMDIABEYEEXA No Retinopathy 03/27/2023   Last foot exam: No foot exam found Tobacco Use:  Tobacco Use: Medium Risk (05/13/2024)   Patient History    Smoking Tobacco Use:  Former    Smokeless Tobacco Use: Never    Passive Exposure: Not on file     Objective:  Lab Results  Component Value Date   HGBA1C 6.0 (A) 05/07/2024    Lab Results  Component Value Date   CREATININE 1.10 (H) 05/13/2024   BUN 17 05/13/2024   NA 138 05/13/2024   K 4.5 05/13/2024   CL 95 (L) 05/13/2024   CO2 24 05/13/2024    Lab Results  Component Value Date   CHOL 111 08/03/2023   HDL 28 (L) 08/03/2023   LDLCALC 45 08/03/2023   LDLDIRECT 76.0 08/23/2021   TRIG 190 (H) 08/03/2023   CHOLHDL 4.0 08/03/2023    Medications Reviewed Today     Reviewed by [name], [name] (Pharmacist) on 06/18/24 at 1020  Med List Status: <None>   Medication Order Taking? Sig Documenting Provider Last Dose Status Informant  apixaban  (ELIQUIS ) 5 MG TABS tablet [id number]  Take 1 tablet (5 mg total) by mouth 2 (two) times daily. Santo Stanly LABOR, MD  Active   atorvastatin  (LIPITOR) 40 MG tablet [id number]  Take 1 tablet (40 mg total) by mouth daily. Santo Stanly LABOR, MD  Active Self, Pharmacy Records  Continuous Glucose Receiver WILMOT G7 [place]) [place] [id number]  Use as directed while on insulin  [name], Wanda K, MD  Active Self, Pharmacy Records  Continuous Glucose Sensor (DEXCOM G7 SENSOR) [place] [id number]  Apply 1 sensor to skin as directed to monitor blood sugars. Replace with new sensor every 10 days. [name], Apolinar POUR, MD  Active   diltiazem  (MATZIM LA ) 360 MG 24 hr tablet [id number]  Take 1 tablet (360 mg total) by  mouth daily. Santo Stanly LABOR, MD  Active   dofetilide  (TIKOSYN ) 250 MCG capsule [id number]  Take 1 capsule (250 mcg total) by mouth 2 (two) times daily with breakfast and at bedtime. Nahser, Aleene PARAS, MD  Active Self, Pharmacy Records  DULoxetine  (CYMBALTA ) 60 MG capsule [id number]  Take 1 capsule (60 mg total) by mouth daily. [name], Apolinar POUR, MD  Active   empagliflozin  (JARDIANCE ) 25 MG TABS tablet [id number]  Take 1 tablet (25 mg total) by mouth daily before  breakfast. [name], Wanda K, MD  Active   ezetimibe  (ZETIA ) 10 MG tablet [id number]  Take 1 tablet (10 mg total) by mouth daily. Santo Stanly LABOR, MD  Active   insulin  degludec (TRESIBA  FLEXTOUCH) 100 UNIT/ML FlexTouch Pen [id number]  Inject 28 Units into the skin daily as directed [name], Wanda K, MD  Active   loratadine  (CLARITIN ) 10 MG tablet [id number]  TAKE 1 TABLET BY MOUTH EVERY DAY [name], Wanda K, MD  Active Self, Pharmacy Records  metFORMIN  (GLUCOPHAGE ) 1000 MG tablet [id number]  Take 1 tablet (1,000 mg total) by mouth 2 (two) times daily. Douglass Kenney NOVAK, FNP  Active Self, Pharmacy Records  metolazone  (ZAROXOLYN ) 2.5 MG tablet [id number]  Take 1 tablet (2.5 mg total) by mouth as directed. Weekly as needed for weight gain of 3 lb in 24 hours, 5 lb in a week or leg swelling Rolan Ezra RAMAN, MD  Active Self, Pharmacy Records  metoprolol  tartrate (LOPRESSOR ) 50 MG tablet [id number]  Take 1 tablet (50 mg total) by mouth every morning AND 2 tablets (100 mg total) every evening. Santo Stanly LABOR, MD  Active   nitroGLYCERIN  (NITROSTAT ) 0.4 MG SL tablet [id number]  Place 1 tablet (0.4 mg total) under the tongue every 5 (five) minutes x 3 doses as needed for chest pain. Santo Stanly LABOR, MD  Active Self, Pharmacy Records  nystatin -triamcinolone  (MYCOLOG II) cream [id number]  Apply topically 2 (two) times daily as directed. Billy Philippe SAUNDERS, NP  Active   omeprazole  (PRILOSEC) 20 MG capsule [id number]  Take 1 capsule (20 mg total) by mouth 2 (two) times daily before breakfast and at bedtime. [name], Apolinar POUR, MD  Active Self, Pharmacy Records  ondansetron  (ZOFRAN -ODT) 4 MG disintegrating tablet [id number]  Take 1 tablet (4 mg total) by mouth every 8 (eight) hours as needed for nausea or vomiting. [name], Apolinar POUR, MD  Active Self, Pharmacy Records  OXYGEN  [id number]  Inhale 4 L/min into the lungs continuous. [provider]  Active Pharmacy Records, Self  Polyethyl Glycol-Propyl  Glycol (SYSTANE) 0.4-0.3 % SOLN [id number]  Place 1 drop into both eyes daily as needed (dry eyes). [provider]  Active Self, Pharmacy Records  spironolactone  (ALDACTONE ) 25 MG tablet [id number]  Take 0.5 tablets (12.5 mg total) by mouth daily. Campbell, Kenzie E, NP  Active   tirzepatide  (MOUNJARO ) 15 MG/0.5ML Pen [id number]  Inject 15 mg into the skin once a week. Santo Stanly LABOR, MD  Active   torsemide  (DEMADEX ) 20 MG tablet [id number]  Take 3 tablets (60 mg total) by mouth daily before breakfast Nahser, Aleene PARAS, MD  Active Self, Pharmacy Records  UNABLE TO FIND [id number]  Inhale 1 Device into the lungs at bedtime. CPAP [provider]  Active Pharmacy Records, Self  valACYclovir  (VALTREX ) 1000 MG tablet [id number]  TAKE 2 TABLETS BY MOUTH EVERY 12 HOURS AS NEEDED [name], Wanda K, MD  Active Self, Pharmacy Records           Med Note (COFFELL, Kayo Zion  M   Wed Jan 30, 2024  9:48 AM)                Assessment/Plan:   Diabetes: - Currently controlled; goal A1c <7%. Cardiorenal risk reduction is optimized.. Blood pressure is at goal <130/80. LDL is at goal.  - Reviewed long term cardiovascular and renal outcomes of uncontrolled blood sugar., Reviewed goal A1c, goal fasting, and goal 2 hour post prandial glucose. Recommended to check glucose continously with sensor, and Reviewed signs and symptoms of hypoglycemia. -DECREASE Tresiba  to 24 units daily, continue other medication therapy -Counseled to prick finger for BG check for any low BG events between now and f/u. Keep a log of fingerprick reading vs sensor reading.   Follow Up Plan: 2 weeks  Jon VEAR Lindau, PharmD Clinical Pharmacist [phone]

## 2024-06-29 ENCOUNTER — Other Ambulatory Visit: Payer: Self-pay | Admitting: Internal Medicine

## 2024-06-29 DIAGNOSIS — E1165 Type 2 diabetes mellitus with hyperglycemia: Secondary | ICD-10-CM

## 2024-06-30 ENCOUNTER — Other Ambulatory Visit

## 2024-06-30 DIAGNOSIS — E1165 Type 2 diabetes mellitus with hyperglycemia: Secondary | ICD-10-CM

## 2024-06-30 MED ORDER — OMEPRAZOLE 20 MG PO CPDR
20.0000 mg | DELAYED_RELEASE_CAPSULE | Freq: Two times a day (BID) | ORAL | 3 refills | Status: AC
  Filled 2024-07-07: qty 60, 30d supply, fill #0
  Filled 2024-08-05 (×2): qty 60, 30d supply, fill #1

## 2024-06-30 MED ORDER — EMPAGLIFLOZIN 25 MG PO TABS
25.0000 mg | ORAL_TABLET | Freq: Every day | ORAL | 0 refills | Status: AC
  Filled 2024-07-07: qty 90, 90d supply, fill #0

## 2024-06-30 NOTE — Progress Notes (Signed)
 "  06/30/2024 Name: SAFIRA PROFFIT MRN: [id number] DOB: 1946-05-15  Chief Complaint  Patient presents with   Medication Management   Diabetes    Emrie JENSYN SHAVE is a 78 y.o. year old female who presented for a telephone visit.   They were referred to the pharmacist by their PCP for assistance in managing diabetes.    Subjective:  Care Team: Primary Care Provider: [name], Wanda K, MD   Medication Access/Adherence  Current Pharmacy:  DARRYLE LAW - [facility] [address] [place] [address] Phone: [phone] Fax: [phone]   Patient reports affordability concerns with their medications: No  Patient reports access/transportation concerns to their pharmacy: No  Patient reports adherence concerns with their medications:  No     Diabetes:  Current medications: Mounjaro  15mg  once weekly, Jardiance  25mg  daily, Metformin  1000mg  BID, Tresiba  24 units daily Medications tried in the past: None  Current glucose readings:   Observed patterns:  Patient denies hypoglycemic s/sx including dizziness, shakiness, sweating. Patient denies hyperglycemic symptoms including polyuria, polydipsia, polyphagia, nocturia, neuropathy, blurred vision.  Patient had less low BG events with insulin  decrease but still had confirmed BG lows with finger prick.   Macrovascular and Microvascular Risk Reduction:  Statin? yes (Atorvastatin  40mg ); ACEi/ARB? no; therapy not indicated  Last urinary albumin /creatinine ratio:  Lab Results  Component Value Date   MICRALBCREAT Unable to calculate 05/06/2024   Last eye exam:  Lab Results  Component Value Date   HMDIABEYEEXA No Retinopathy 03/27/2023   Last foot exam: No foot exam found Tobacco Use:  Tobacco Use: Medium Risk (05/13/2024)   Patient History    Smoking Tobacco Use: Former    Smokeless Tobacco Use: Never    Passive Exposure: Not on file     Objective:  Lab Results  Component Value Date   HGBA1C 6.0 (A)  05/07/2024    Lab Results  Component Value Date   CREATININE 1.10 (H) 05/13/2024   BUN 17 05/13/2024   NA 138 05/13/2024   K 4.5 05/13/2024   CL 95 (L) 05/13/2024   CO2 24 05/13/2024    Lab Results  Component Value Date   CHOL 111 08/03/2023   HDL 28 (L) 08/03/2023   LDLCALC 45 08/03/2023   LDLDIRECT 76.0 08/23/2021   TRIG 190 (H) 08/03/2023   CHOLHDL 4.0 08/03/2023    Medications Reviewed Today     Reviewed by Lionell Jon DEL, RPH (Pharmacist) on 06/30/24 at 1044  Med List Status: <None>   Medication Order Taking? Sig Documenting Provider Last Dose Status Informant  apixaban  (ELIQUIS ) 5 MG TABS tablet [id number]  Take 1 tablet (5 mg total) by mouth 2 (two) times daily. Santo Stanly LABOR, MD  Active   atorvastatin  (LIPITOR) 40 MG tablet [id number]  Take 1 tablet (40 mg total) by mouth daily. Santo Stanly LABOR, MD  Active Self, Pharmacy Records  Continuous Glucose Receiver WILMOT G7 [place]) [place] [id number]  Use as directed while on insulin  [name], Wanda K, MD  Active Self, Pharmacy Records  Continuous Glucose Sensor (DEXCOM G7 SENSOR) [place] [id number]  Apply 1 sensor to skin as directed to monitor blood sugars. Replace with new sensor every 10 days. [name], Apolinar POUR, MD  Active   diltiazem  (MATZIM LA ) 360 MG 24 hr tablet [id number]  Take 1 tablet (360 mg total) by mouth daily. Santo Stanly LABOR, MD  Active   dofetilide  (TIKOSYN ) 250 MCG capsule [id number]  Take 1 capsule (250 mcg total) by mouth 2 (two)  times daily with breakfast and at bedtime. Nahser, Aleene PARAS, MD  Active Self, Pharmacy Records  DULoxetine  (CYMBALTA ) 60 MG capsule [id number]  Take 1 capsule (60 mg total) by mouth daily. [name], Apolinar POUR, MD  Active   empagliflozin  (JARDIANCE ) 25 MG TABS tablet [id number]  Take 1 tablet (25 mg total) by mouth daily before breakfast. [name], Wanda K, MD  Active   ezetimibe  (ZETIA ) 10 MG tablet [id number]  Take 1 tablet (10 mg total) by mouth daily. Santo Stanly LABOR, MD  Active   insulin  degludec (TRESIBA  FLEXTOUCH) 100 UNIT/ML FlexTouch Pen [id number]  Inject 28 Units into the skin daily as directed [name], Wanda K, MD  Active   loratadine  (CLARITIN ) 10 MG tablet [id number]  TAKE 1 TABLET BY MOUTH EVERY DAY [name], Wanda K, MD  Active Self, Pharmacy Records  metFORMIN  (GLUCOPHAGE ) 1000 MG tablet [id number]  Take 1 tablet (1,000 mg total) by mouth 2 (two) times daily. Webb, Padonda B, FNP  Active Self, Pharmacy Records  metolazone  (ZAROXOLYN ) 2.5 MG tablet [id number]  Take 1 tablet (2.5 mg total) by mouth as directed. Weekly as needed for weight gain of 3 lb in 24 hours, 5 lb in a week or leg swelling Rolan Ezra RAMAN, MD  Active Self, Pharmacy Records  metoprolol  tartrate (LOPRESSOR ) 50 MG tablet [id number]  Take 1 tablet (50 mg total) by mouth every morning AND 2 tablets (100 mg total) every evening. Santo Stanly LABOR, MD  Active   nitroGLYCERIN  (NITROSTAT ) 0.4 MG SL tablet [id number]  Place 1 tablet (0.4 mg total) under the tongue every 5 (five) minutes x 3 doses as needed for chest pain. Santo Stanly LABOR, MD  Active Self, Pharmacy Records  nystatin -triamcinolone  (MYCOLOG II) cream [id number]  Apply topically 2 (two) times daily as directed. Billy Philippe SAUNDERS, NP  Active   omeprazole  (PRILOSEC) 20 MG capsule [id number]  Take 1 capsule (20 mg total) by mouth 2 (two) times daily before breakfast and at bedtime. [name], Apolinar POUR, MD  Active Self, Pharmacy Records  ondansetron  (ZOFRAN -ODT) 4 MG disintegrating tablet [id number]  Take 1 tablet (4 mg total) by mouth every 8 (eight) hours as needed for nausea or vomiting. [name], Apolinar POUR, MD  Active Self, Pharmacy Records  OXYGEN  [id number]  Inhale 4 L/min into the lungs continuous. [provider]  Active Pharmacy Records, Self  Polyethyl Glycol-Propyl Glycol (SYSTANE) 0.4-0.3 % SOLN [id number]  Place 1 drop into both eyes daily as needed (dry eyes). [provider]  Active Self, Pharmacy Records   spironolactone  (ALDACTONE ) 25 MG tablet [id number]  Take 0.5 tablets (12.5 mg total) by mouth daily. Campbell, Kenzie E, NP  Active   tirzepatide  (MOUNJARO ) 15 MG/0.5ML Pen [id number]  Inject 15 mg into the skin once a week. Santo Stanly LABOR, MD  Active   torsemide  (DEMADEX ) 20 MG tablet [id number]  Take 3 tablets (60 mg total) by mouth daily before breakfast Nahser, Aleene PARAS, MD  Active Self, Pharmacy Records  UNABLE TO FIND [id number]  Inhale 1 Device into the lungs at bedtime. CPAP [provider]  Active Pharmacy Records, Self  valACYclovir  (VALTREX ) 1000 MG tablet [id number]  TAKE 2 TABLETS BY MOUTH EVERY 12 HOURS AS NEEDED [name], Wanda K, MD  Active Self, Pharmacy Records           Med Note (COFFELL, JON HERO   Wed Jan 30, 2024  9:48 AM)                Assessment/Plan:  Diabetes: - Currently controlled; goal A1c <7%. Cardiorenal risk reduction is optimized.. Blood pressure is at goal <130/80. LDL is at goal.  - Reviewed long term cardiovascular and renal outcomes of uncontrolled blood sugar., Reviewed goal A1c, goal fasting, and goal 2 hour post prandial glucose. Recommended to check glucose continously with sensor, and Reviewed signs and symptoms of hypoglycemia. -DECREASE Tresiba  to 20 units daily, continue other medication therapy -Counseled to prick finger for BG check for any low BG events between now and f/u. Keep a log of fingerprick reading vs sensor reading.   Follow Up Plan: 2 weeks  Jon VEAR Lindau, PharmD Clinical Pharmacist [phone]  "

## 2024-07-01 ENCOUNTER — Other Ambulatory Visit (HOSPITAL_COMMUNITY): Payer: Self-pay

## 2024-07-07 ENCOUNTER — Other Ambulatory Visit (HOSPITAL_COMMUNITY): Payer: Self-pay

## 2024-07-08 ENCOUNTER — Other Ambulatory Visit (HOSPITAL_COMMUNITY): Payer: Self-pay

## 2024-07-08 ENCOUNTER — Telehealth: Payer: Self-pay | Admitting: Internal Medicine

## 2024-07-08 NOTE — Telephone Encounter (Signed)
" °*  STAT* If patient is at the pharmacy, call can be transferred to refill team.   1. Which medications need to be refilled? (please list name of each medication and dose if known) dofetilide  (TIKOSYN ) 250 MCG capsule   2. Which pharmacy/location (including street and city if local pharmacy) is medication to be sent to?  [place] - [facility]    3. Do they need a 30 day or 90 day supply? 90   Patient is out of medication.  "

## 2024-07-09 ENCOUNTER — Telehealth: Payer: Self-pay

## 2024-07-09 ENCOUNTER — Other Ambulatory Visit (HOSPITAL_COMMUNITY): Payer: Self-pay

## 2024-07-09 NOTE — Telephone Encounter (Signed)
 Received update from [employer] on changes for coverage on some part D medications will change starting July 03, 2024.   Listed below are this patient's drugs and the expected changes in 2026. Not all prescriptions may be listed.   TRESIBA  FLEX INJ 100unit  This drug is not on the plan's drug list and will not be covered.   Evaluate if any of these alternative drugs will work for your patient.   Alternative Drugs: LANTUS  SOLOSTAR, TOUJEO  SOLOSTAR.    Follow up with pt and inform her of information above. Pt reports she just pick up the Rx today for 30 days supplies and she is doing 20 units a day.   Inform pt will forward this to Dr. Charlett to address on the change.   Also with forward to [place] as well.

## 2024-07-10 ENCOUNTER — Other Ambulatory Visit: Payer: Self-pay

## 2024-07-10 ENCOUNTER — Other Ambulatory Visit (HOSPITAL_COMMUNITY): Payer: Self-pay

## 2024-07-10 MED ORDER — DOFETILIDE 250 MCG PO CAPS
250.0000 ug | ORAL_CAPSULE | Freq: Two times a day (BID) | ORAL | 2 refills | Status: AC
  Filled 2024-07-10: qty 180, 90d supply, fill #0

## 2024-07-10 NOTE — Telephone Encounter (Signed)
 Advised pt refill on the following medication dofetilide  (TIKOSYN ) sent to preferred pharmacy.

## 2024-07-10 NOTE — Telephone Encounter (Signed)
 Patient is following up. She is concerned as she is completely out of medication.

## 2024-07-10 NOTE — Telephone Encounter (Signed)
 Ok to change to lantus  for cost   but may have to adjust insulin  dose   ( check )  have AHngela also give opinion about switch

## 2024-07-10 NOTE — Telephone Encounter (Signed)
 Okay to do Lantus . No need to adjust insulin  dose at the start but will need to keep close f/u to monitor for adjustments as needed. I have a f/u already scheduled with her for 1/14 next week.

## 2024-07-14 ENCOUNTER — Ambulatory Visit: Attending: Internal Medicine | Admitting: Internal Medicine

## 2024-07-14 ENCOUNTER — Other Ambulatory Visit (HOSPITAL_COMMUNITY): Payer: Self-pay

## 2024-07-14 VITALS — BP 105/68 | HR 61 | Ht 66.5 in | Wt 202.4 lb

## 2024-07-14 DIAGNOSIS — I495 Sick sinus syndrome: Secondary | ICD-10-CM | POA: Diagnosis not present

## 2024-07-14 DIAGNOSIS — R61 Generalized hyperhidrosis: Secondary | ICD-10-CM | POA: Diagnosis not present

## 2024-07-14 DIAGNOSIS — E785 Hyperlipidemia, unspecified: Secondary | ICD-10-CM | POA: Diagnosis not present

## 2024-07-14 DIAGNOSIS — I2510 Atherosclerotic heart disease of native coronary artery without angina pectoris: Secondary | ICD-10-CM

## 2024-07-14 DIAGNOSIS — I480 Paroxysmal atrial fibrillation: Secondary | ICD-10-CM

## 2024-07-14 DIAGNOSIS — Z9861 Coronary angioplasty status: Secondary | ICD-10-CM

## 2024-07-14 DIAGNOSIS — I484 Atypical atrial flutter: Secondary | ICD-10-CM

## 2024-07-14 LAB — TSH: TSH: 5.78 u[IU]/mL — ABNORMAL HIGH (ref 0.450–4.500)

## 2024-07-14 LAB — T4, FREE: Free T4: 1.15 ng/dL (ref 0.82–1.77)

## 2024-07-14 MED ORDER — LANTUS SOLOSTAR 100 UNIT/ML ~~LOC~~ SOPN
20.0000 [IU] | PEN_INJECTOR | Freq: Every day | SUBCUTANEOUS | 0 refills | Status: AC
  Filled 2024-07-14: qty 15, 75d supply, fill #0

## 2024-07-14 NOTE — Progress Notes (Signed)
 " Cardiology Office Note:  .    Date:  07/14/2024  ID:  [name], DOB 02/25/1946, MRN [id number] PCP: Charlett Apolinar POUR, MD   [facility]Care Providers Cardiologist:  Stanly DELENA Leavens, MD Electrophysiologist:  Soyla Gladis Norton, MD     CC: CAD management  History of Present Illness: .    [name] is a 79 y.o. female with coronary artery disease who presents with chest pressure and diaphoresis.  She experiences chest pressure that occasionally wakes her up at night. The pressure is significant enough to wake her but resolves after a few minutes without the need for nitroglycerin . This symptom is infrequent and has not been a major concern.  She reports experiencing excessive sweating, particularly noticeable after activities such as showering or cooking. She describes being 'wet with perspiration' and sometimes needing to sit down before finishing her tasks.  Her past medical history includes coronary artery disease, persistent atrial fibrillation, tachybrady syndrome, and pulmonary hypertension. She has a pacemaker and is on Tikosyn  for atrial fibrillation, Eliquis  for stroke prevention, and diltiazem . She also takes vitamin B and D supplements.  She recalls having a heart catheterization in 2018, which revealed a blockage that was not intervened upon due to collateral blood flow. She has had multiple right heart catheterizations in the past.  The patient reports that the chest pressure is infrequent and has not been a major concern. She did not report any recent episodes of shortness of breath that were particularly concerning.  Discussed the use of AI scribe software for clinical note transcription with the patient, who gave verbal consent to proceed.  Relevant histories: .  Social  2023: Mixed hypoxic respiratory failure eval, transitioned from Dr. Alveta 2024: Lost 70 lbs on Mounjaro  2025: Gen Change with Dr. Norton, established with AHF (Dr. Rolan) ROS: As per  HPI.   Studies Reviewed: .     Cardiac Studies & Procedures   ______________________________________________________________________________________________ CARDIAC CATHETERIZATION  CARDIAC CATHETERIZATION 08/21/2023  Conclusion 1. Normal filling pressures. 2. Upper normal PA pressure. 3. Normal cardiac output.   CARDIAC CATHETERIZATION  CARDIAC CATHETERIZATION 05/02/2022  Conclusion 1. Borderline elevated PCWP, normal RA pressure. 2. Moderate primarily pulmonary venous hypertension with PVR 2.97 WU.  Continue current torsemide  and Jardiance .     ECHOCARDIOGRAM  ECHOCARDIOGRAM COMPLETE 08/17/2023  Narrative ECHOCARDIOGRAM REPORT    Patient Name:   [name] Date of Exam: 08/17/2023 Medical Rec #:  [id number]    Height:       66.5 in Accession #:    [id number]   Weight:       195.0 lb Date of Birth:  13-May-1946     BSA:          1.990 m Patient Age:    77 years     BP:           103/62 mmHg Patient Gender: F            HR:           57 bpm. Exam Location:  Outpatient  Procedure: 2D Echo, Cardiac Doppler and Color Doppler (Both Spectral and Color Flow Doppler were utilized during procedure).  Indications:    Research study patient  History:        Patient has prior history of Echocardiogram examinations, most recent 12/10/2021.  Sonographer:    Ozell Free Referring Phys: [id number] DANIEL R BENSIMHON  IMPRESSIONS   1. Left ventricular ejection fraction, by estimation, is 55 to 60%.  The left ventricle has normal function. The left ventricle has no regional wall motion abnormalities. Left ventricular diastolic parameters are indeterminate. 2. Right ventricular systolic function is normal. The right ventricular size is normal. There is mildly elevated pulmonary artery systolic pressure. The estimated right ventricular systolic pressure is 36.6 mmHg. 3. The mitral valve is abnormal. Mild mitral valve regurgitation. No evidence of mitral stenosis. 4. The aortic valve  is normal in structure. Aortic valve regurgitation is not visualized. No aortic stenosis is present. 5. The inferior vena cava is normal in size with greater than 50% respiratory variability, suggesting right atrial pressure of 3 mmHg.  FINDINGS Left Ventricle: Left ventricular ejection fraction, by estimation, is 55 to 60%. The left ventricle has normal function. The left ventricle has no regional wall motion abnormalities. Strain imaging was not performed. The left ventricular internal cavity size was normal in size. There is no left ventricular hypertrophy. Left ventricular diastolic parameters are indeterminate.  Right Ventricle: The right ventricular size is normal. No increase in right ventricular wall thickness. Right ventricular systolic function is normal. There is mildly elevated pulmonary artery systolic pressure. The tricuspid regurgitant velocity is 2.90 m/s, and with an assumed right atrial pressure of 3 mmHg, the estimated right ventricular systolic pressure is 36.6 mmHg.  Left Atrium: Left atrial size was normal in size.  Right Atrium: Right atrial size was normal in size.  Pericardium: There is no evidence of pericardial effusion.  Mitral Valve: The mitral valve is abnormal. There is mild calcification of the mitral valve leaflet(s). Mild mitral annular calcification. Mild mitral valve regurgitation. No evidence of mitral valve stenosis.  Tricuspid Valve: The tricuspid valve is normal in structure. Tricuspid valve regurgitation is trivial. No evidence of tricuspid stenosis.  Aortic Valve: The aortic valve is normal in structure. Aortic valve regurgitation is not visualized. No aortic stenosis is present.  Pulmonic Valve: The pulmonic valve was normal in structure. Pulmonic valve regurgitation is not visualized. No evidence of pulmonic stenosis.  Aorta: The aortic root is normal in size and structure.  Venous: The inferior vena cava is normal in size with greater than 50%  respiratory variability, suggesting right atrial pressure of 3 mmHg.  IAS/Shunts: No atrial level shunt detected by color flow Doppler.  Additional Comments: 3D imaging was not performed. A device lead is visualized.   TRICUSPID VALVE TR Peak grad:   33.6 mmHg TR Vmax:        290.00 cm/s  Toribio Fuel MD Electronically signed by Toribio Fuel MD Signature Date/Time: 08/17/2023/4:14:57 PM    Final   TEE  ECHO TEE 09/29/2016  Narrative *[facility]* *[facility]* [address] [place], [place] [address] [phone]  ------------------------------------------------------------------- Transesophageal Echocardiography  Patient:    [name], [name] MR #:       [id number] Study Date: 09/29/2016 Gender:     F Age:        70 Height: Weight: BSA: Pt. Status: Room:       [facility]  PERFORMING   Oneil Parchment, M.D. ADMITTING    Will Inocencio, MD ATTENDING    Soyla Inocencio, MD ORDERING     Soyla Inocencio, MD REFERRING    Soyla Inocencio, MD SONOGRAPHER  [facility]  cc:  ------------------------------------------------------------------- LV EF: 55% -   60%  ------------------------------------------------------------------- Indications:      Atrial fibrillation - 427.31.  ------------------------------------------------------------------- History:   PMH:   Transient ischemic attack.  Risk factors: obesity. Hypertension. Dyslipidemia.  ------------------------------------------------------------------- Study Conclusions  -  Left ventricle: The cavity size was normal. Wall thickness was normal. Systolic function was normal. The estimated ejection fraction was in the range of 55% to 60%. - Aortic valve: No evidence of vegetation. - Mitral valve: No evidence of vegetation. There was mild regurgitation. - Left atrium: No evidence of thrombus in the atrial cavity or appendage. No evidence of thrombus in the appendage. no mass. - Right atrium: No  evidence of thrombus in the atrial cavity or appendage. - Atrial septum: No defect or patent foramen ovale was identified. Echo contrast study showed no right-to-left atrial level shunt, following an increase in RA pressure induced by provocative maneuvers. - Tricuspid valve: No evidence of vegetation. - Line: A venous pacing wire was visualized in the right atrial cavity and right ventricular cavity, with its tip at the RV apex. - Superior vena cava: The study excluded a thrombus.  ------------------------------------------------------------------- Study data:   Study status:  Routine.  Consent:  The risks, benefits, and alternatives to the procedure were explained to the patient and informed consent was obtained.  Procedure:  Initial setup. The patient was brought to the laboratory. Surface ECG leads were monitored. Sedation. Conscious sedation was administered. Transesophageal echocardiography. Topical anesthesia was obtained using viscous lidocaine . An adult multiplane transesophageal probe was inserted by the attending cardiologist. Image quality was adequate.  Study completion:  The patient tolerated the procedure well. There were no complications.  Administered medications: Fentanyl , 50mcg, IV.  Midazolam , 4mg , IV.          Diagnostic transesophageal echocardiography.  2D and color Doppler. Birthdate:  Patient birthdate: 1946/03/12.  Age:  Patient is 79 yr old.  Sex:  Gender: female.  Blood pressure:     144/81  Patient status:  Inpatient.  Study date:  Study date: 09/29/2016. Study time: 09:19 AM.  Location:  Endoscopy.  -------------------------------------------------------------------  ------------------------------------------------------------------- Left ventricle:  The cavity size was normal. Wall thickness was normal. Systolic function was normal. The estimated ejection fraction was in the range of 55% to  60%.  ------------------------------------------------------------------- Aortic valve:   Structurally normal valve.   Cusp separation was normal.  No evidence of vegetation.  Doppler:  There was no regurgitation.  ------------------------------------------------------------------- Aorta:  The aorta was normal, not dilated, and non-diseased.  ------------------------------------------------------------------- Mitral valve:   Mildly thickened leaflets . Leaflet separation was normal.  No evidence of vegetation.  Doppler:  There was mild regurgitation.  ------------------------------------------------------------------- Left atrium:  Prominent ridge posterior to LA appendage (normal variant). The atrium was normal in size.  No evidence of thrombus in the atrial cavity or appendage.  No evidence of thrombus in the appendage. The appendage was of normal size. Emptying velocity was normal.  ------------------------------------------------------------------- Atrial septum:  No defect or patent foramen ovale was identified. Echo contrast study showed no right-to-left atrial level shunt, following an increase in RA pressure induced by provocative maneuvers.  ------------------------------------------------------------------- Right ventricle:  The cavity size was normal. Wall thickness was normal. Pacer wire or catheter noted in right ventricle. Systolic function was normal.  ------------------------------------------------------------------- Pulmonic valve:    Doppler:  There was trivial regurgitation.  ------------------------------------------------------------------- Tricuspid valve:   Structurally normal valve.   Leaflet separation was normal.  No evidence of vegetation.  Doppler:  There was mild regurgitation.  ------------------------------------------------------------------- Right atrium:  The atrium was normal in size. Pacer wire or catheter noted in right atrium.  No  evidence of thrombus in the atrial cavity or appendage.  ------------------------------------------------------------------- Pericardium:  The pericardium was normal in appearance.  There was no pericardial effusion.  ------------------------------------------------------------------- Systemic veins: Line: A venous pacing wire was visualized in the right atrial cavity and right ventricular cavity, with its tip at the RV apex. Superior vena cava: The study excluded a thrombus.  ------------------------------------------------------------------- Post procedure conclusions Ascending Aorta:  - The aorta was normal, not dilated, and non-diseased.  ------------------------------------------------------------------- Prepared and Electronically Authenticated by  Oneil Parchment, M.D. 2018-03-30T10:35:04    CT SCANS  CT CORONARY MORPH W/CTA COR W/SCORE 07/04/2017  Addendum 07/05/2017  2:25 PM ADDENDUM REPORT: 07/05/2017 14:23  CLINICAL DATA:  79 year old female with h/o atrial fibrillation ablation now presenting with DOE.  EXAM: Cardiac CT/CTA  TECHNIQUE: The patient was scanned on a Siemens Somatom scanner.  COMPARISON:  Cardiac CTA from 04/09/2017.  FINDINGS: A 120 kV prospective scan was triggered in the descending thoracic aorta at 111 HU's. Gantry rotation speed was 280 msecs and collimation was .9 mm. No beta blockade and no NTG was given. The 3D data set was reconstructed in 5% intervals of the 60-80 % of the R-R cycle. Diastolic phases were analyzed on a dedicated work station using MPR, MIP and VRT modes. The patient received 80 cc of contrast.  There is normal pulmonary vein drainage into the left atrium (2 on the right and 2 on the left) with ostial measurements as follows:  RUPV:  21 x 17 mm (previously 21 x 19 mm)  RLPV:  18 x 14 mm (previously 20 x 14 mm)  LUPV:  22 x 18 mm (previously 19 x 17 mm)  LLPV:  19 x 18 mm (previously 19 x 16 mm)  The left atrial  appendage is large with chicken wing morphology and one major lobe with ostial size 30 x 26 mm and length 46 mm. There is no thrombus in the left atrial appendage.  The esophagus runs to the left from the left atrial midline and is in the proximity to the LLPV.  Aorta:  Normal caliber.  No dissection, minimal calcifications.  Aortic Valve:  Trileaflet.  Trivial calcifications.  Coronary Arteries: Normal coronary origin. Right dominance. The study was performed without use of NTG and insufficient for plaque evaluation.  Dilated pulmonary artery measuring 32 mm suggestive of pulmonary hypertension.  IMPRESSION: 1. There is normal pulmonary vein drainage into the left atrium. There is no evidence for pulmonary vein stenosis that is defined as 50% narrowing of the lumen when compared to the prior study.  2. The left atrial appendage is large with chicken wing morphology and one major lobe with ostial size 30 x 26 mm and length 46 mm. There is no thrombus in the left atrial appendage.  3. The esophagus runs to the left from the left atrial midline and is in the proximity to the LLPV.  4. Dilated pulmonary artery measuring 32 mm suggestive of pulmonary hypertension.   Electronically Signed By: Leim Moose On: 07/05/2017 14:23  Narrative EXAM: OVER-READ INTERPRETATION  CT CHEST  The following report is an over-read performed by radiologist Dr. Toribio Cove [facility] on 07/04/2017. This over-read does not include interpretation of cardiac or coronary anatomy or pathology. The coronary calcium  score/coronary CTA interpretation by the cardiologist is attached.  COMPARISON:  Chest CT 04/09/2017.  FINDINGS: Aortic atherosclerosis. Within the visualized portions of the thorax there are no suspicious appearing pulmonary nodules or masses, there is no acute consolidative airspace disease, no pleural effusions, no pneumothorax and no lymphadenopathy.  Visualized portions of the upper abdomen are unremarkable. There  are no aggressive appearing lytic or blastic lesions noted in the visualized portions of the skeleton.  IMPRESSION: 1.  Aortic Atherosclerosis (ICD10-I70.0).  Electronically Signed: By: Toribio Aye M.D. On: 07/04/2017 10:49     ______________________________________________________________________________________________       Physical Exam:    VS:  BP 105/68 (BP Location: Right Arm)   Pulse 61   Ht 5' 6.5 (1.689 m)   Wt 202 lb 6.4 oz (91.8 kg)   SpO2 93%   BMI 32.18 kg/m    Wt Readings from Last 3 Encounters:  07/14/24 202 lb 6.4 oz (91.8 kg)  05/13/24 200 lb (90.7 kg)  05/06/24 199 lb 12.8 oz (90.6 kg)    Gen: no distress Ears:  Dempsey Sign Cardiac: No Rubs or Gallops, no Murmur, RRR +2 radial pulses Respiratory: Coarse breath sounds bilaterally, normal effort, normal  respiratory rate GI: Soft, nontender, non-distended  MS: No  edema;  moves all extremities Integument: Skin feels warm Neuro:  At time of evaluation, alert and oriented to person/place/time/situation  Psych: Normal affect, patient feels fair      ASSESSMENT AND PLAN: .    Coronary artery disease with rare angina symptoms, status post percutaneous coronary intervention Intermittent chest pressure, particularly nocturnal, resolving spontaneously. No recent coronary intervention. Collateral circulation from LAD territory. Blood pressure low, limiting medical management options. Stress test and heart catheterization discussed, but she declined due to rarity of symptoms. - Monitor symptoms and report any increase in frequency or severity. - Educated on red flag symptoms and advised hospital visit if experienced.  Atrial fibrillation and tachy-brady syndrome with pacemaker Persistent atrial fibrillation and tachy-brady syndrome managed with pacemaker and Tikosyn . EKG shows sinus rhythm with pacing, no atrial fibrillation. Blood  pressure low, limiting medication adjustments. Current management deemed reasonable. - Continue current management with pacemaker and Tikosyn . - Monitor blood pressure and symptoms.  Diaphoresis, evaluation for thyroid  dysfunction New onset diaphoresis since November. Previous thyroid  function tests normal. Family history of thyroid  conditions. Plan to re-evaluate thyroid  function due to new symptoms. - Ordered TSH and free T4 to evaluate thyroid  function. - Will review results and adjust management if necessary.  Chronic HFpEF Pulmonary Hypertension ILD - no change in therapy - Managed by Drs. Rolan and Pollard  Three month f/u with me or my team  Longitudinal care: The evaluation and management services provided today reflect the complexity inherent in caring for this patient, including the ongoing longitudinal relationship and management of multiple chronic conditions and/or the need for care coordination. The visit required a comprehensive assessment and management plan tailored to the patient's unique needs Time was spent addressing not only the acute concerns but also the broader context of the patient's health, including preventive care, chronic disease management, and care coordination as appropriate.  Complex longitudinal is necessary for conditions including: SDM about coronary interventions  Stanly Leavens, MD [facility] [address] Cardiologist [facility]  [address] [place], [address] [place], [place] [address] [phone]  1:17 PM  "

## 2024-07-14 NOTE — Telephone Encounter (Signed)
 Rx sent

## 2024-07-14 NOTE — Patient Instructions (Signed)
 Medication Instructions:  Your physician recommends that you continue on your current medications as directed. Please refer to the Current Medication list given to you today.  *If you need a refill on your cardiac medications before your next appointment, please call your pharmacy*  Lab Work: NONE    Testing/Procedures: NONE   Follow-Up: At [facility], you and your health needs are our priority.  As part of our continuing mission to provide you with exceptional heart care, our providers are all part of one team.  This team includes your primary Cardiologist (physician) and Advanced Practice Providers or APPs (Physician Assistants and Nurse Practitioners) who all work together to provide you with the care you need, when you need it.  Your next appointment:   3 month(s)  Provider:   Stanly DELENA Leavens, MD or Miriam Shams, NP, Orren Fabry, PA-C, Jon Hails, PA-C, Lum Louis, NP, Callie Goodrich, PA-C, or Lamarr Satterfield, NP         Other Instructions

## 2024-07-14 NOTE — Addendum Note (Signed)
 Addended by: Dennys Guin on: 07/14/2024 03:33 PM   Modules accepted: Orders

## 2024-07-15 ENCOUNTER — Ambulatory Visit: Payer: Self-pay

## 2024-07-16 ENCOUNTER — Other Ambulatory Visit

## 2024-07-16 ENCOUNTER — Other Ambulatory Visit: Payer: Self-pay

## 2024-07-16 DIAGNOSIS — E1165 Type 2 diabetes mellitus with hyperglycemia: Secondary | ICD-10-CM

## 2024-07-16 DIAGNOSIS — Z79899 Other long term (current) drug therapy: Secondary | ICD-10-CM

## 2024-07-16 NOTE — Progress Notes (Signed)
 "  07/16/2024 Name: [name] MRN: [id number] DOB: [birth year]  Chief Complaint  Patient presents with   Medication Management   Diabetes    [name] is a 79 y.o. year old female who presented for a telephone visit.   They were referred to the pharmacist by their PCP for assistance in managing diabetes.    Subjective:  Care Team: Primary Care Provider: [name], Wanda K, MD   Medication Access/Adherence  Current Pharmacy:  [facility] - [facility] [address] [place] [address] Phone: [phone] Fax: [phone]   Patient reports affordability concerns with their medications: No  Patient reports access/transportation concerns to their pharmacy: No  Patient reports adherence concerns with their medications:  No     Diabetes:  Current medications: Mounjaro  15mg  once weekly, Jardiance  25mg  daily, Metformin  1000mg  BID, Tresiba  20 units daily Medications tried in the past: None  Current glucose readings:   Observed patterns:  Patient denies hypoglycemic s/sx including dizziness, shakiness, sweating. Patient denies hyperglycemic symptoms including polyuria, polydipsia, polyphagia, nocturia, neuropathy, blurred vision.  Patient had several low BG alarms that did not match finger prick BG when she changed to new sensor. Never had a low finger prick BG   Macrovascular and Microvascular Risk Reduction:  Statin? yes (Atorvastatin  40mg ); ACEi/ARB? no; therapy not indicated  Last urinary albumin /creatinine ratio:  Lab Results  Component Value Date   MICRALBCREAT Unable to calculate 05/06/2024   Last eye exam:  Lab Results  Component Value Date   HMDIABEYEEXA No Retinopathy 03/27/2023   Last foot exam: No foot exam found Tobacco Use:  Tobacco Use: Medium Risk (05/13/2024)   Patient History    Smoking Tobacco Use: Former    Smokeless Tobacco Use: Never    Passive Exposure: Not on file     Objective:  Lab Results  Component  Value Date   HGBA1C 6.0 (A) 05/07/2024    Lab Results  Component Value Date   CREATININE 1.10 (H) 05/13/2024   BUN 17 05/13/2024   NA 138 05/13/2024   K 4.5 05/13/2024   CL 95 (L) 05/13/2024   CO2 24 05/13/2024    Lab Results  Component Value Date   CHOL 111 08/03/2023   HDL 28 (L) 08/03/2023   LDLCALC 45 08/03/2023   LDLDIRECT 76.0 08/23/2021   TRIG 190 (H) 08/03/2023   CHOLHDL 4.0 08/03/2023    Medications Reviewed Today     Reviewed by Lionell Jon DEL, RPH (Pharmacist) on 07/16/24 at 1105  Med List Status: <None>   Medication Order Taking? Sig Documenting Provider Last Dose Status Informant  apixaban  (ELIQUIS ) 5 MG TABS tablet [id number] Yes Take 1 tablet (5 mg total) by mouth 2 (two) times daily. Santo Stanly LABOR, MD  Active   atorvastatin  (LIPITOR) 40 MG tablet [id number] Yes Take 1 tablet (40 mg total) by mouth daily. Santo Stanly LABOR, MD  Active Self, Pharmacy Records  Cholecalciferol  (VITAMIN D -3) 125 MCG (5000 UT) TABS [id number]  Take by mouth daily at 6 (six) AM. [provider]  Active   Continuous Glucose Receiver WILMOT G7 [place]) DEVI [id number] Yes Use as directed while on insulin  [name], Wanda K, MD  Active Self, Pharmacy Records  Continuous Glucose Sensor (DEXCOM G7 SENSOR) MISC [id number] Yes Apply 1 sensor to skin as directed to monitor blood sugars. Replace with new sensor every 10 days. [name], Wanda K, MD  Active   diltiazem  (MATZIM LA ) 360 MG 24 hr tablet [id number] Yes Take  1 tablet (360 mg total) by mouth daily. Santo Stanly LABOR, MD  Active   dofetilide  (TIKOSYN ) 250 MCG capsule [id number] Yes Take 1 capsule (250 mcg total) by mouth 2 (two) times daily with breakfast and at bedtime. Santo Stanly LABOR, MD  Active   DULoxetine  (CYMBALTA ) 60 MG capsule [id number] Yes Take 1 capsule (60 mg total) by mouth daily. [name], Apolinar POUR, MD  Active   empagliflozin  (JARDIANCE ) 25 MG TABS tablet [id number] Yes Take 1 tablet (25 mg total)  by mouth daily before breakfast. [name], Wanda K, MD  Active   ezetimibe  (ZETIA ) 10 MG tablet [id number] Yes Take 1 tablet (10 mg total) by mouth daily. Santo Stanly LABOR, MD  Active   insulin  degludec (TRESIBA  FLEXTOUCH) 100 UNIT/ML FlexTouch Pen [id number] Yes Inject 28 Units into the skin daily as directed [name], Wanda K, MD  Active   insulin  glargine (LANTUS  SOLOSTAR) 100 UNIT/ML Solostar Pen [id number]  Inject 20 Units into the skin daily. [name], Apolinar POUR, MD  Active   loratadine  (CLARITIN ) 10 MG tablet [id number] Yes TAKE 1 TABLET BY MOUTH EVERY DAY [name], Wanda K, MD  Active Self, Pharmacy Records  metFORMIN  (GLUCOPHAGE ) 1000 MG tablet [id number] Yes Take 1 tablet (1,000 mg total) by mouth 2 (two) times daily. Webb, Padonda B, FNP  Active Self, Pharmacy Records  metolazone  (ZAROXOLYN ) 2.5 MG tablet [id number] Yes Take 1 tablet (2.5 mg total) by mouth as directed. Weekly as needed for weight gain of 3 lb in 24 hours, 5 lb in a week or leg swelling Rolan Ezra RAMAN, MD  Active Self, Pharmacy Records  metoprolol  tartrate (LOPRESSOR ) 50 MG tablet [id number] Yes Take 1 tablet (50 mg total) by mouth every morning AND 2 tablets (100 mg total) every evening. Santo Stanly LABOR, MD  Active   nitroGLYCERIN  (NITROSTAT ) 0.4 MG SL tablet [id number] Yes Place 1 tablet (0.4 mg total) under the tongue every 5 (five) minutes x 3 doses as needed for chest pain. Santo Stanly LABOR, MD  Active Self, Pharmacy Records  nystatin -triamcinolone  (MYCOLOG II) cream [id number] Yes Apply topically 2 (two) times daily as directed. Billy Philippe SAUNDERS, NP  Active   omeprazole  (PRILOSEC) 20 MG capsule [id number] Yes Take 1 capsule (20 mg total) by mouth 2 (two) times daily before breakfast and at bedtime. [name], Apolinar POUR, MD  Active   ondansetron  (ZOFRAN -ODT) 4 MG disintegrating tablet [id number] Yes Take 1 tablet (4 mg total) by mouth every 8 (eight) hours as needed for nausea or vomiting. [name], Apolinar POUR, MD  Active  Self, Pharmacy Records  OXYGEN  [id number] Yes Inhale 4 L/min into the lungs continuous. [provider]  Active Pharmacy Records, Self  Polyethyl Glycol-Propyl Glycol (SYSTANE) 0.4-0.3 % SOLN [id number] Yes Place 1 drop into both eyes daily as needed (dry eyes). [provider]  Active Self, Pharmacy Records  spironolactone  (ALDACTONE ) 25 MG tablet [id number] Yes Take 0.5 tablets (12.5 mg total) by mouth daily. Campbell, Kenzie E, NP  Active   tirzepatide  (MOUNJARO ) 15 MG/0.5ML Pen [id number] Yes Inject 15 mg into the skin once a week. Santo Stanly LABOR, MD  Active   torsemide  (DEMADEX ) 20 MG tablet [id number] Yes Take 3 tablets (60 mg total) by mouth daily before breakfast Nahser, Aleene PARAS, MD  Active Self, Pharmacy Records  UNABLE TO FIND [id number] Yes Inhale 1 Device into the lungs at bedtime. CPAP [provider]  Active Pharmacy Records, Self  valACYclovir  (VALTREX ) 1000 MG tablet [id number] Yes TAKE 2 TABLETS BY MOUTH EVERY  12 HOURS AS NEEDED [name], Wanda K, MD  Active Self, Pharmacy Records           Med Note (COFFELL, JON HERO   Wed Jan 30, 2024  9:48 AM)    vitamin B-12 (CYANOCOBALAMIN ) 500 MCG tablet [id number]  Take 500 mcg by mouth daily. [provider]  Active               Assessment/Plan:   Diabetes: - Currently controlled; goal A1c <7%. Cardiorenal risk reduction is optimized.. Blood pressure is at goal <130/80. LDL is at goal.  - Reviewed long term cardiovascular and renal outcomes of uncontrolled blood sugar., Reviewed goal A1c, goal fasting, and goal 2 hour post prandial glucose. Recommended to check glucose continously with sensor, and Reviewed signs and symptoms of hypoglycemia. -Continue current medication therapy. Okay to finish out supply of tresiba  before transitioning to lantus  (has about 2 months on hand) -Counseled to prick finger for BG check for any low BG events between now and f/u. Keep a log of fingerprick reading vs  sensor reading.   Follow Up Plan: 2 months  Jon VEAR Lindau, PharmD Clinical Pharmacist [phone]  "

## 2024-07-21 ENCOUNTER — Other Ambulatory Visit (HOSPITAL_COMMUNITY): Payer: Self-pay

## 2024-07-28 ENCOUNTER — Ambulatory Visit (HOSPITAL_BASED_OUTPATIENT_CLINIC_OR_DEPARTMENT_OTHER)

## 2024-07-30 ENCOUNTER — Telehealth (HOSPITAL_BASED_OUTPATIENT_CLINIC_OR_DEPARTMENT_OTHER): Payer: Self-pay

## 2024-07-30 ENCOUNTER — Encounter: Payer: Self-pay | Admitting: Cardiology

## 2024-07-30 NOTE — Telephone Encounter (Signed)
 Hi Dr. Santo. You recently saw this patient on 07/14/2024 at which time she reported intermittent chest pressure. It looks like there was discussion about stress test vs cardiac catheterization but patient wanted to hold off at that time. She is now in need of a caudal injection. I know spinal injections are very low risk, but do you mind commenting on surgical clearance given discussion of further ischemic evaluation at recent visit?  Please route response back to P CV DIV PREOP.  Thanks so much! [place]

## 2024-07-30 NOTE — Telephone Encounter (Signed)
 Pharmacy, can you please provide recommendations for holding Eliquis  for upcoming procedure?  Thank you!

## 2024-07-30 NOTE — Progress Notes (Signed)
 PERIOPERATIVE PRESCRIPTION FOR IMPLANTED CARDIAC DEVICE PROGRAMMING   Patient Information:  Patient: [name]  MRN: [id number]  Date of Birth: 20-Oct-1945   Procedure:  Caudal injection Date of Surgery:  Clearance TBD      STAT                       Surgeon:  Dr. Deatrice Manus Surgeon's Group or Practice Name:  [facility] Phone number:  [phone] Fax number:  [phone]   Device Information:   Clinic EP Physician:   Dr. Soyla Norton Device Type:  Pacemaker Manufacturer and Phone #:  [employer]: [phone] Pacemaker Dependent?:  No Date of Last Device Check:  06/03/2024         Normal Device Function?:  Yes     Electrophysiologist's Recommendations:   Have magnet available. Provide continuous ECG monitoring when magnet is used or reprogramming is to be performed.  Procedure should not interfere with device function.  No device programming or magnet placement needed.  Per Device Clinic Standing Orders, [name]  07/30/2024 10:37 AM

## 2024-07-30 NOTE — Telephone Encounter (Signed)
"  ° °  Pre-operative Risk Assessment    Patient Name: [name]  DOB: [birth year] MRN: [id number]   Date of last office visit: 07/14/2024 with Dr. Santo  Date of next office visit: 10/13/2024 with Samantha Fabry, PA-C  Request for Surgical Clearance    Procedure:  Caudal injection  Date of Surgery:  Clearance TBD      STAT                          Surgeon:  Dr. Deatrice Manus Surgeon's Group or Practice Name:  [facility] Phone number:  [phone] Fax number:  [phone]   Type of Clearance Requested:   - Medical  - Pharmacy:  Hold Apixaban  (Eliquis ) 3 days prior and pt can resume the next day   Type of Anesthesia:  Not Indicated   Additional requests/questions:  Pt has a [facility] PPM  Signed, Patrcia Iverson CROME   07/30/2024, 9:08 AM   "

## 2024-07-31 NOTE — Telephone Encounter (Signed)
 Patient with diagnosis of atrial fibrillation on Eliquis  for anticoagulation.    Procedure:  Caudal injection   Date of Surgery:  Clearance TBD      STAT      CHA2DS2-VASc Score = 9   This indicates a 12.2% annual risk of stroke. The patient's score is based upon: CHF History: 1 HTN History: 1 Diabetes History: 1 Stroke History: 2 Vascular Disease History: 1 Age Score: 2 Gender Score: 1   Chart indicates history of stroke in 2006, 2008  CrCl 61 Platelet count 354  Patient has not had an Afib/aflutter ablation in the last 3 months, DCCV within the last 4 weeks or a watchman implanted in the last 45 days   Will need a 3 day hold for caudal injection - with elevated CHADS2-VASc will need to review with cardiologist as to whether patient should be bridged.    **This guidance is not considered finalized until pre-operative APP has relayed final recommendations.**

## 2024-08-03 ENCOUNTER — Other Ambulatory Visit: Payer: Self-pay | Admitting: Internal Medicine

## 2024-08-04 ENCOUNTER — Ambulatory Visit (HOSPITAL_BASED_OUTPATIENT_CLINIC_OR_DEPARTMENT_OTHER)
Admission: RE | Admit: 2024-08-04 | Discharge: 2024-08-04 | Disposition: A | Discharge status: Home / Self Care | Source: Ambulatory Visit | Attending: Internal Medicine | Admitting: Internal Medicine

## 2024-08-04 ENCOUNTER — Other Ambulatory Visit (HOSPITAL_COMMUNITY): Payer: Self-pay

## 2024-08-04 ENCOUNTER — Other Ambulatory Visit: Payer: Self-pay

## 2024-08-04 DIAGNOSIS — J849 Interstitial pulmonary disease, unspecified: Secondary | ICD-10-CM

## 2024-08-04 MED ORDER — TRESIBA FLEXTOUCH 100 UNIT/ML ~~LOC~~ SOPN: 28.0000 [IU] | PEN_INJECTOR | Freq: Every day | SUBCUTANEOUS | 3 refills | Status: AC

## 2024-08-05 ENCOUNTER — Ambulatory Visit: Payer: Self-pay | Admitting: Internal Medicine

## 2024-08-05 ENCOUNTER — Other Ambulatory Visit (HOSPITAL_COMMUNITY): Payer: Self-pay

## 2024-08-05 DIAGNOSIS — E611 Iron deficiency: Secondary | ICD-10-CM

## 2024-08-05 DIAGNOSIS — E1165 Type 2 diabetes mellitus with hyperglycemia: Secondary | ICD-10-CM

## 2024-08-07 NOTE — Telephone Encounter (Signed)
 Received second request for clearance.

## 2024-08-08 ENCOUNTER — Other Ambulatory Visit (HOSPITAL_COMMUNITY): Payer: Self-pay

## 2024-08-08 ENCOUNTER — Other Ambulatory Visit: Payer: Self-pay | Admitting: Internal Medicine

## 2024-08-08 DIAGNOSIS — I480 Paroxysmal atrial fibrillation: Secondary | ICD-10-CM

## 2024-08-08 MED ORDER — APIXABAN 5 MG PO TABS
5.0000 mg | ORAL_TABLET | Freq: Two times a day (BID) | ORAL | 1 refills | Status: AC
  Filled 2024-08-08: qty 180, 90d supply, fill #0

## 2024-08-08 NOTE — Telephone Encounter (Signed)
"  ° °  Patient Name: [name]  DOB: 03-Aug-1945 MRN: [id number]  Primary Cardiologist: Stanly DELENA Leavens, MD  Chart reviewed as part of pre-operative protocol coverage. Given past medical history and time since last visit, based on ACC/AHA guidelines, [name] is at acceptable risk for the planned procedure without further cardiovascular testing.    Callie Goodrich, PA-C (07/30/2024) Patient is likely of mild to moderate risk. She notes rare symptoms at our last visit and declined additional testing for that reason. She has several comorbidity that are none cardiac that contribute to her disease state. This is a low risk procedure; if no change in her symptoms, I think its reasonable to proceed.   Per Pharmacy Will need a 3 day hold for caudal injection - with elevated CHADS2-VASc will need to review with cardiologist as to whether patient should be bridged.  Per Dr. Inocencio (08/07/2024) No reason to bridge anticoagulation for procedure   The patient was advised that if she develops new symptoms prior to surgery to contact our office to arrange for a follow-up visit, and she verbalized understanding.  I will route this recommendation to the requesting party via Epic fax function and remove from pre-op  pool.  Please call with questions.  Lamarr Satterfield, NP 08/08/2024, 7:23 AM  "

## 2024-08-08 NOTE — Telephone Encounter (Signed)
 Prescription refill request for Eliquis  received. Indication: AFLUTTER Last office visit: 07/14/24 Scr: 1.10 (05/13/24) Age: 79 Weight: 91.8 KG

## 2024-08-14 ENCOUNTER — Encounter

## 2024-09-17 ENCOUNTER — Other Ambulatory Visit

## 2024-09-22 ENCOUNTER — Other Ambulatory Visit

## 2024-09-24 ENCOUNTER — Ambulatory Visit: Admitting: Internal Medicine

## 2024-10-13 ENCOUNTER — Ambulatory Visit: Admitting: Physician Assistant

## 2024-11-07 ENCOUNTER — Ambulatory Visit: Admitting: Internal Medicine

## 2024-11-10 ENCOUNTER — Ambulatory Visit (HOSPITAL_COMMUNITY): Admitting: Internal Medicine

## 2024-11-13 ENCOUNTER — Encounter

## 2025-01-26 ENCOUNTER — Ambulatory Visit

## 2025-02-12 ENCOUNTER — Encounter

## 2025-05-14 ENCOUNTER — Encounter

## 2025-08-13 ENCOUNTER — Encounter
